# Patient Record
Sex: Female | Born: 1935
Health system: Southern US, Community
[De-identification: ages and names within clinical notes are randomized; demographics above are authoritative.]

## PROBLEM LIST (undated history)

## (undated) DIAGNOSIS — M545 Low back pain, unspecified: Secondary | ICD-10-CM

## (undated) DIAGNOSIS — M25511 Pain in right shoulder: Secondary | ICD-10-CM

## (undated) DIAGNOSIS — I872 Venous insufficiency (chronic) (peripheral): Secondary | ICD-10-CM

## (undated) DIAGNOSIS — R0609 Other forms of dyspnea: Secondary | ICD-10-CM

## (undated) DIAGNOSIS — M25512 Pain in left shoulder: Secondary | ICD-10-CM

## (undated) DIAGNOSIS — D649 Anemia, unspecified: Secondary | ICD-10-CM

## (undated) DIAGNOSIS — F32A Depression, unspecified: Secondary | ICD-10-CM

## (undated) DIAGNOSIS — I517 Cardiomegaly: Secondary | ICD-10-CM

## (undated) DIAGNOSIS — K219 Gastro-esophageal reflux disease without esophagitis: Secondary | ICD-10-CM

## (undated) DIAGNOSIS — E669 Obesity, unspecified: Secondary | ICD-10-CM

## (undated) DIAGNOSIS — G4733 Obstructive sleep apnea (adult) (pediatric): Secondary | ICD-10-CM

## (undated) DIAGNOSIS — I350 Nonrheumatic aortic (valve) stenosis: Secondary | ICD-10-CM

## (undated) DIAGNOSIS — I10 Essential (primary) hypertension: Secondary | ICD-10-CM

## (undated) DIAGNOSIS — N3281 Overactive bladder: Secondary | ICD-10-CM

## (undated) DIAGNOSIS — G4700 Insomnia, unspecified: Secondary | ICD-10-CM

## (undated) DIAGNOSIS — K5790 Diverticulosis of intestine, part unspecified, without perforation or abscess without bleeding: Secondary | ICD-10-CM

## (undated) DIAGNOSIS — I480 Paroxysmal atrial fibrillation: Secondary | ICD-10-CM

## (undated) DIAGNOSIS — F329 Major depressive disorder, single episode, unspecified: Secondary | ICD-10-CM

## (undated) DIAGNOSIS — R0600 Dyspnea, unspecified: Secondary | ICD-10-CM

## (undated) DIAGNOSIS — I5032 Chronic diastolic (congestive) heart failure: Secondary | ICD-10-CM

## (undated) DIAGNOSIS — I714 Abdominal aortic aneurysm, without rupture, unspecified: Secondary | ICD-10-CM

## (undated) DIAGNOSIS — M069 Rheumatoid arthritis, unspecified: Secondary | ICD-10-CM

## (undated) DIAGNOSIS — E039 Hypothyroidism, unspecified: Secondary | ICD-10-CM

## (undated) DIAGNOSIS — M1990 Unspecified osteoarthritis, unspecified site: Secondary | ICD-10-CM

## (undated) HISTORY — DX: Obesity, unspecified: E66.9

## (undated) HISTORY — DX: Essential (primary) hypertension: I10

## (undated) HISTORY — DX: Pain in right shoulder: M25.511

## (undated) HISTORY — PX: VESICOVAGINAL FISTULA CLOSURE W/ TAH: SUR271

## (undated) HISTORY — DX: Hypothyroidism, unspecified: E03.9

## (undated) HISTORY — DX: Overactive bladder: N32.81

## (undated) HISTORY — DX: Rheumatoid arthritis, unspecified: M06.9

## (undated) HISTORY — DX: Cardiomegaly: I51.7

## (undated) HISTORY — DX: Gastro-esophageal reflux disease without esophagitis: K21.9

## (undated) HISTORY — DX: Low back pain: M54.5

## (undated) HISTORY — DX: Pain in left shoulder: M25.512

## (undated) HISTORY — DX: Diverticulosis of intestine, part unspecified, without perforation or abscess without bleeding: K57.90

## (undated) HISTORY — DX: Major depressive disorder, single episode, unspecified: F32.9

## (undated) HISTORY — DX: Obstructive sleep apnea (adult) (pediatric): G47.33

## (undated) HISTORY — DX: Other forms of dyspnea: R06.09

## (undated) HISTORY — DX: Abdominal aortic aneurysm, without rupture, unspecified: I71.40

## (undated) HISTORY — DX: Insomnia, unspecified: G47.00

## (undated) HISTORY — DX: Chronic diastolic (congestive) heart failure: I50.32

## (undated) HISTORY — DX: Anemia, unspecified: D64.9

## (undated) HISTORY — PX: CYSTOCELE REPAIR: SHX163

## (undated) HISTORY — PX: OTHER SURGICAL HISTORY: SHX169

## (undated) HISTORY — PX: KNEE ARTHROSCOPY W/ AUTOGENOUS CARTILAGE IMPLANTATION (ACI) PROCEDURE: SHX1865

## (undated) HISTORY — DX: Depression, unspecified: F32.A

## (undated) HISTORY — DX: Venous insufficiency (chronic) (peripheral): I87.2

## (undated) HISTORY — DX: Dyspnea, unspecified: R06.00

## (undated) HISTORY — DX: Low back pain, unspecified: M54.50

---
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
## 1965-04-11 HISTORY — PX: WRIST SURGERY: SHX841

## 1970-04-11 HISTORY — PX: TOTAL ABDOMINAL HYSTERECTOMY: SHX209

## 1978-04-11 HISTORY — PX: OTHER SURGICAL HISTORY: SHX169

## 1988-04-11 HISTORY — PX: SHOULDER SURGERY: SHX246

## 1994-04-11 HISTORY — PX: OTHER SURGICAL HISTORY: SHX169

## 1994-04-11 HISTORY — PX: COSMETIC SURGERY: SHX468

## 2000-03-16 ENCOUNTER — Ambulatory Visit (HOSPITAL_COMMUNITY): Admission: RE | Admit: 2000-03-16 | Discharge: 2000-03-16 | Payer: Self-pay | Admitting: *Deleted

## 2002-11-20 ENCOUNTER — Ambulatory Visit: Admission: RE | Admit: 2002-11-20 | Discharge: 2002-11-20 | Payer: Self-pay

## 2003-09-03 ENCOUNTER — Emergency Department (HOSPITAL_COMMUNITY): Admission: EM | Admit: 2003-09-03 | Discharge: 2003-09-03 | Payer: Self-pay | Admitting: Emergency Medicine

## 2003-11-18 ENCOUNTER — Encounter: Admission: RE | Admit: 2003-11-18 | Discharge: 2003-11-18 | Payer: Self-pay | Admitting: Neurosurgery

## 2006-11-29 ENCOUNTER — Ambulatory Visit: Payer: Self-pay | Admitting: Cardiology

## 2006-12-19 ENCOUNTER — Ambulatory Visit: Payer: Self-pay

## 2006-12-19 ENCOUNTER — Ambulatory Visit: Payer: Self-pay | Admitting: Cardiology

## 2006-12-19 LAB — CONVERTED CEMR LAB
BUN: 25 mg/dL — ABNORMAL HIGH (ref 6–23)
CO2: 32 meq/L (ref 19–32)
Cholesterol: 226 mg/dL (ref 0–200)
Creatinine, Ser: 1 mg/dL (ref 0.4–1.2)
Direct LDL: 159.2 mg/dL
HDL: 43.9 mg/dL (ref >39.0)
Potassium: 4 meq/L (ref 3.5–5.1)

## 2008-08-18 ENCOUNTER — Encounter: Payer: Self-pay | Admitting: Pulmonary Disease

## 2008-08-20 ENCOUNTER — Encounter: Payer: Self-pay | Admitting: Pulmonary Disease

## 2008-08-22 ENCOUNTER — Ambulatory Visit: Payer: Self-pay | Admitting: Pulmonary Disease

## 2008-08-22 DIAGNOSIS — R0902 Hypoxemia: Secondary | ICD-10-CM | POA: Insufficient documentation

## 2008-08-22 DIAGNOSIS — R042 Hemoptysis: Secondary | ICD-10-CM | POA: Insufficient documentation

## 2008-08-22 LAB — CONVERTED CEMR LAB
CO2: 31 meq/L (ref 19–32)
Chloride: 103 meq/L (ref 96–112)
Creatinine, Ser: 0.6 mg/dL (ref 0.4–1.2)

## 2008-08-25 ENCOUNTER — Ambulatory Visit: Payer: Self-pay | Admitting: Cardiovascular Disease

## 2008-08-25 DIAGNOSIS — J8489 Other specified interstitial pulmonary diseases: Secondary | ICD-10-CM | POA: Insufficient documentation

## 2008-08-26 ENCOUNTER — Telehealth (INDEPENDENT_AMBULATORY_CARE_PROVIDER_SITE_OTHER): Payer: Self-pay | Admitting: *Deleted

## 2008-09-02 ENCOUNTER — Ambulatory Visit (HOSPITAL_COMMUNITY): Admission: RE | Admit: 2008-09-02 | Discharge: 2008-09-02 | Payer: Self-pay | Admitting: Pulmonary Disease

## 2008-09-04 ENCOUNTER — Ambulatory Visit: Payer: Self-pay | Admitting: Pulmonary Disease

## 2008-09-04 DIAGNOSIS — S2239XA Fracture of one rib, unspecified side, initial encounter for closed fracture: Secondary | ICD-10-CM | POA: Insufficient documentation

## 2008-09-29 ENCOUNTER — Ambulatory Visit: Payer: Self-pay | Admitting: Internal Medicine

## 2008-09-30 ENCOUNTER — Ambulatory Visit: Payer: Self-pay | Admitting: Pulmonary Disease

## 2008-10-21 ENCOUNTER — Ambulatory Visit (HOSPITAL_BASED_OUTPATIENT_CLINIC_OR_DEPARTMENT_OTHER): Admission: RE | Admit: 2008-10-21 | Discharge: 2008-10-21 | Payer: Self-pay | Admitting: Pulmonary Disease

## 2008-10-21 ENCOUNTER — Encounter: Payer: Self-pay | Admitting: Pulmonary Disease

## 2008-10-29 ENCOUNTER — Ambulatory Visit: Payer: Self-pay | Admitting: Pulmonary Disease

## 2008-11-13 ENCOUNTER — Ambulatory Visit: Payer: Self-pay | Admitting: Pulmonary Disease

## 2008-11-13 DIAGNOSIS — G4733 Obstructive sleep apnea (adult) (pediatric): Secondary | ICD-10-CM | POA: Insufficient documentation

## 2008-12-08 ENCOUNTER — Telehealth: Payer: Self-pay | Admitting: Pulmonary Disease

## 2009-01-14 ENCOUNTER — Inpatient Hospital Stay (HOSPITAL_COMMUNITY): Admission: RE | Admit: 2009-01-14 | Discharge: 2009-01-19 | Payer: Self-pay | Admitting: Orthopedic Surgery

## 2010-05-01 ENCOUNTER — Encounter: Payer: Self-pay | Admitting: Sports Medicine

## 2010-06-21 ENCOUNTER — Telehealth (INDEPENDENT_AMBULATORY_CARE_PROVIDER_SITE_OTHER): Payer: Self-pay | Admitting: *Deleted

## 2010-06-29 NOTE — Progress Notes (Signed)
Summary: pt had a chest xray and wants Dr Silverio Lay opinion  Phone Note Call from Patient   Caller: Patient Call For: sood Summary of Call: Patient phoned stated that she is in [facility] and she had a chest xray the impression said upper right lobe probably infections or infalmatory in orgin. Patient stated that sent her to a Thoracic surgeon and they want to operate on her lungs and she would like to speak to Dr Craige Cotta they want her to do it now and she would rather come home. She can be reached at [phone] Patient last seen by Dr Craige Cotta for sleep 11/13/2008 and 09/30/08 Initial call taken by: Vedia Coffer,  June 21, 2010 2:27 PM  Follow-up for Phone Call        Spoke with pt.  She states that she is in [place], started to have the same symptoms that brought her here in the first place- chest congestion, fatigue, and dyspnea.  She states that she went to family doc in [facility] and had cxr and ct chest and was then reffered to thoracic surgeon who told her she needs right upper lobectomy and this needs to be done asap.  She states that she wants VS's recs on this.  She states that they did not give her any abx and last time she was seen for this issue here, it was cleared up with abx. I advised we can forward msg to VS and ask if he will call her but he is out of the office until 3/19. She is okay with waiting.  I am going to place her CT report that she faxed in VS lookat.  Pls advise thanks   Follow-up by: Vernie Murders,  June 21, 2010 3:40 PM  Additional Follow-up for Phone Call Additional follow up Details #1::        Reviewed CT chest report from June 15, 2010 done in [place], [place].  This reports benign pleural findings Rt lateral thorax, and minimal ground glass infiltrate Rt upper lobe.  Explained to pt over phone that it is difficult to determine indication for Rt upper lobectomy based on this report, but that my information is limit.  She has deferred surgical intervention for now.  She will return  from [place] on April 19.  Will have triage team arrange for ROV with me after April 19 to review status, and have asked her to bring disk with her chest imaging studies from [place]. Additional Follow-up by: Coralyn Helling MD,  June 24, 2010 3:52 PM    Additional Follow-up for Phone Call Additional follow up Details #2::    called and spoke with pt.  pt scheduled to see VS 08/09/2010 at 1:45pm.  Arman Filter LPN  June 24, 2010 4:18 PM

## 2010-07-15 LAB — CBC
Hemoglobin: 10.7 g/dL — ABNORMAL LOW (ref 12.0–15.0)
MCHC: 33.8 g/dL (ref 30.0–36.0)
MCHC: 34 g/dL (ref 30.0–36.0)
MCV: 93.5 fL (ref 78.0–100.0)
Platelets: 251 10*3/uL (ref 150–400)
Platelets: 262 10*3/uL (ref 150–400)
Platelets: 273 10*3/uL (ref 150–400)
RBC: 3.21 MIL/uL — ABNORMAL LOW (ref 3.87–5.11)
RDW: 13 % (ref 11.5–15.5)
RDW: 13.1 % (ref 11.5–15.5)
WBC: 10 10*3/uL (ref 4.0–10.5)
WBC: 12.2 10*3/uL — ABNORMAL HIGH (ref 4.0–10.5)

## 2010-07-15 LAB — PROTIME-INR
INR: 1.19 (ref 0.00–1.49)
INR: 1.28 (ref 0.00–1.49)
INR: 2.03 — ABNORMAL HIGH (ref 0.00–1.49)
INR: 2.29 — ABNORMAL HIGH (ref 0.00–1.49)
Prothrombin Time: 15.9 seconds — ABNORMAL HIGH (ref 11.6–15.2)
Prothrombin Time: 22.8 seconds — ABNORMAL HIGH (ref 11.6–15.2)
Prothrombin Time: 25 seconds — ABNORMAL HIGH (ref 11.6–15.2)

## 2010-07-15 LAB — BASIC METABOLIC PANEL
BUN: 11 mg/dL (ref 6–23)
BUN: 17 mg/dL (ref 6–23)
BUN: 7 mg/dL (ref 6–23)
CO2: 29 mEq/L (ref 19–32)
CO2: 31 mEq/L (ref 19–32)
Calcium: 8.1 mg/dL — ABNORMAL LOW (ref 8.4–10.5)
Calcium: 8.4 mg/dL (ref 8.4–10.5)
Calcium: 8.4 mg/dL (ref 8.4–10.5)
Chloride: 102 mEq/L (ref 96–112)
Creatinine, Ser: 0.78 mg/dL (ref 0.4–1.2)
GFR calc Af Amer: >60 mL/min (ref >60)
GFR calc non Af Amer: 51 mL/min — ABNORMAL LOW (ref >60)
GFR calc non Af Amer: >60 mL/min (ref >60)
Glucose, Bld: 115 mg/dL — ABNORMAL HIGH (ref 70–99)
Glucose, Bld: 122 mg/dL — ABNORMAL HIGH (ref 70–99)

## 2010-07-15 LAB — TYPE AND SCREEN: Antibody Screen: NEGATIVE

## 2010-07-16 LAB — CBC
HCT: 39.6 % (ref 36.0–46.0)
Hemoglobin: 13.2 g/dL (ref 12.0–15.0)
MCV: 93.9 fL (ref 78.0–100.0)
RDW: 12.9 % (ref 11.5–15.5)

## 2010-07-16 LAB — COMPREHENSIVE METABOLIC PANEL
Alkaline Phosphatase: 86 U/L (ref 39–117)
BUN: 15 mg/dL (ref 6–23)
Creatinine, Ser: 0.72 mg/dL (ref 0.4–1.2)
Glucose, Bld: 83 mg/dL (ref 70–99)
Potassium: 4.4 mEq/L (ref 3.5–5.1)
Total Bilirubin: 0.9 mg/dL (ref 0.3–1.2)
Total Protein: 7 g/dL (ref 6.0–8.3)

## 2010-07-16 LAB — URINALYSIS, ROUTINE W REFLEX MICROSCOPIC
Bilirubin Urine: NEGATIVE
Glucose, UA: NEGATIVE mg/dL
Hgb urine dipstick: NEGATIVE
Ketones, ur: NEGATIVE mg/dL
pH: 7 (ref 5.0–8.0)

## 2010-07-16 LAB — PROTIME-INR
INR: 1 (ref 0.00–1.49)
Prothrombin Time: 13.4 seconds (ref 11.6–15.2)

## 2010-07-16 LAB — APTT: aPTT: 29 seconds (ref 24–37)

## 2010-07-19 ENCOUNTER — Encounter: Payer: Self-pay | Admitting: Pulmonary Disease

## 2010-08-06 ENCOUNTER — Encounter: Payer: Self-pay | Admitting: Pulmonary Disease

## 2010-08-09 ENCOUNTER — Ambulatory Visit (INDEPENDENT_AMBULATORY_CARE_PROVIDER_SITE_OTHER)
Admission: RE | Admit: 2010-08-09 | Discharge: 2010-08-09 | Disposition: A | Discharge status: Home / Self Care | Payer: Medicare Other | Source: Ambulatory Visit | Attending: Pulmonary Disease | Admitting: Pulmonary Disease

## 2010-08-09 ENCOUNTER — Other Ambulatory Visit: Payer: Self-pay | Admitting: Pulmonary Disease

## 2010-08-09 ENCOUNTER — Ambulatory Visit (INDEPENDENT_AMBULATORY_CARE_PROVIDER_SITE_OTHER): Payer: Medicare Other | Admitting: Pulmonary Disease

## 2010-08-09 ENCOUNTER — Other Ambulatory Visit (INDEPENDENT_AMBULATORY_CARE_PROVIDER_SITE_OTHER): Payer: Medicare Other

## 2010-08-09 ENCOUNTER — Encounter: Payer: Self-pay | Admitting: Pulmonary Disease

## 2010-08-09 VITALS — BP 124/80 | HR 69 | Temp 98.0°F | Ht 63.5 in | Wt 210.0 lb

## 2010-08-09 DIAGNOSIS — M069 Rheumatoid arthritis, unspecified: Secondary | ICD-10-CM | POA: Insufficient documentation

## 2010-08-09 DIAGNOSIS — R930 Abnormal findings on diagnostic imaging of skull and head, not elsewhere classified: Secondary | ICD-10-CM

## 2010-08-09 DIAGNOSIS — G4733 Obstructive sleep apnea (adult) (pediatric): Secondary | ICD-10-CM

## 2010-08-09 LAB — COMPREHENSIVE METABOLIC PANEL
Albumin: 4 g/dL (ref 3.5–5.2)
CO2: 29 mEq/L (ref 19–32)
Calcium: 9 mg/dL (ref 8.4–10.5)
GFR: 71.14 mL/min (ref >60.00)
Glucose, Bld: 81 mg/dL (ref 70–99)
Potassium: 4.2 mEq/L (ref 3.5–5.1)
Sodium: 137 mEq/L (ref 135–145)
Total Protein: 7.2 g/dL (ref 6.0–8.3)

## 2010-08-09 LAB — CBC WITH DIFFERENTIAL/PLATELET
Eosinophils Relative: 2.9 % (ref 0.0–5.0)
Lymphocytes Relative: 21.6 % (ref 12.0–46.0)
Monocytes Absolute: 0.7 10*3/uL (ref 0.1–1.0)
Monocytes Relative: 8.8 % (ref 3.0–12.0)
Neutrophils Relative %: 65.6 % (ref 43.0–77.0)
Platelets: 317 10*3/uL (ref 150.0–400.0)
WBC: 7.8 10*3/uL (ref 4.5–10.5)

## 2010-08-09 LAB — SEDIMENTATION RATE: Sed Rate: 29 mm/hr — ABNORMAL HIGH (ref 0–22)

## 2010-08-09 NOTE — Assessment & Plan Note (Addendum)
I have compared her imaging studies from 2010 to her current studies from [place].  I do not think the two findings are related.  Her symptom description and response to antibiotics suggest that her recent CT chest findings were likely related to an infection.  She does have a history of rheumatoid arthritis, and certainly this can cause pulmonary findings as well.  My suspicion for malignancy is lower, but she does have an extensive history of smoking.  To further assess this check her labs and repeat chest xray.  Based on these will determine when she needs to have follow up CT chest and whether she may need to have airway sampling with bronchoscopy first.  Explained that she may at some point need to have further evaluation by thoracic surgery, but there are several steps we would need to go through before that needs to be considered again.

## 2010-08-09 NOTE — Patient Instructions (Signed)
Labs and Chest xray today; will call with results Refer to Dr. Dierdre Forth with rheumatology Follow up in 3 to 4 weeks

## 2010-08-09 NOTE — Progress Notes (Signed)
Subjective:    [name], female    DOB: [birth year], 77 y.o.   MRN: [id number]  HPI 77 yo female with abnormal chest xray.  I last saw Lisa Mcclure in 11/13/08.  She was found to have hemoptysis and CT chest from 08/25/08 showed 5.3 x 2.8 cm Rt upper lobe paratracheal mass like density.  She has a history of smoking, and there was concern for malignancy.  She had PET scan from 09/02/08 which showed decrease in size of Rt upper lobe mass associated with antibiotic therapy.  She had CT chest from 09/30/08 which showed essentially complete resolution of the Rt upper lobe density.  She was in [place] for winter in 2011.  She was being seen by orthopedics for shoulder pain, and was noted to have increased blood pressure.  She then saw cardiology, and was noted to have a cough and chest congestion along with shortness of breath.  She has a cough with clear sputum.  She had a chest xray which showed right lung changes.  She was started on antibiotics, and had a CT chest.  She was referred to thoracic surgery and was advised that she has lung cancer and needs to have a Rt upper lobectomy.  She called my office at that time asking for advice.  I suggested that she return for further evaluation from [place] when she was able to.  She did not improvement in her symptoms after antibiotic therapy.    She still has a raspy cough and gets winded at times.  She is not having wheeze, fever, sweats, or chills.  She denies hemoptysis.  She still has b/l shoulder pain and stiffness.  She has some pain, swelling, and stiffness in her hands and wrists as well.  She has not had a f/u chest imaging study since she left [place].  Labs from 06/07/10 were reviewed and unremarkable.  Chest xray from 06/07/10 showed severe arthritic changes, and vague pleural based density along right chest wall.  CT chest from 06/15/10 showed ground glass infiltrate in the Rt upper lobe.   Past Medical History  Diagnosis  Date  . Rheumatoid arthritis   . Unspecified essential hypertension   . Esophageal reflux   . Hypothyroidism   . Diverticulosis   . Insomnia   . OSA (obstructive sleep apnea)      Family History  Problem Relation Age of Onset  . COPD Father   . Lung cancer Mother   . Breast cancer Maternal Aunt      History   Social History  . Marital Status: Widowed    Spouse Name: N/A    Number of Children: N/A  . Years of Education: N/A   Occupational History  . Retired    Social History Main Topics  . Smoking status: Former Smoker -- 10 years    Types: Cigarettes    Quit date: 04/11/1984  . Smokeless tobacco: Not on file   Comment: does not recall how many cigs/day she smoked. Some days did not smoke at all  . Alcohol Use: Yes     cocktail daily  . Drug Use: No  . Sexually Active: Not on file   Other Topics Concern  . Not on file   Social History Narrative  . No narrative on file     Not on File   Outpatient Prescriptions Prior to Visit  Medication Sig Dispense Refill  . Cholecalciferol (RA VITAMIN D-3) 1000 UNITS tablet Take 1,000 Units by  mouth daily.        Marland Kitchen HYDROcodone-acetaminophen (VICODIN) 5-500 MG per tablet Take 1-2 daily for pain       . levothyroxine (SYNTHROID, LEVOTHROID) 100 MCG tablet Take 100 mcg by mouth daily. Monday thru Friday        . liothyronine (CYTOMEL) 25 MCG tablet Take 1 Monday Wednesday and Friday        . omeprazole (PRILOSEC) 20 MG capsule Take 20 mg by mouth daily.        Marland Kitchen telmisartan-hydrochlorothiazide (MICARDIS HCT) 80-12.5 MG per tablet Take 1 tablet by mouth daily.        . Eszopiclone (ESZOPICLONE) 3 MG TABS Take immediately before bedtime 1-2 per week       . Biotin 1000 MCG tablet Take 1,000 mcg by mouth daily.        . calcium carbonate (OS-CAL) 600 MG TABS Take 600 mg by mouth. 2 daily       . latanoprost (XALATAN) 0.005 % ophthalmic solution Place 1 drop into both eyes at bedtime.        Marland Kitchen oxybutynin (DITROPAN) 5 MG tablet  Take 5 mg by mouth at bedtime.        . verapamil (VERELAN PM) 240 MG 24 hr capsule Take 240 mg by mouth at bedtime.           Review of Systems     Objective:   Physical Exam Filed Vitals:   08/09/10 1413  BP: 124/80  Pulse: 69  Temp: 98 F (36.7 C)  TempSrc: Oral  Height: 5' 3.5" (1.613 m)  Weight: 210 lb (95.255 kg)  SpO2: 94%      General: normal appearance and obese.  Nose: no deformity, discharge, inflammation, or lesions  Mouth: MP 3, no oral lesions  Neck: no JVD, no thyromegaly  Lungs: coarse breath sounds, no wheezing or rales  Heart: regular rhythm, normal rate, and no murmurs.  Abdomen: obese, soft, non-tender, no masses, normal bowel sounds  Extremities: no edema, cyanosis, or clubbing  Cervical Nodes: no significant adenopathy  Assessment & Plan:   ABNORMAL CHEST XRAY I have compared her imaging studies from 2010 to her current studies from [place].  I do not think the two findings are related.  Her symptom description and response to antibiotics suggest that her recent CT chest findings were likely related to an infection.  She does have a history of rheumatoid arthritis, and certainly this can cause pulmonary findings as well.  My suspicion for malignancy is lower, but she does have an extensive history of smoking.  To further assess this check her labs and repeat chest xray.  Based on these will determine when she needs to have follow up CT chest and whether she may need to have airway sampling with bronchoscopy first.  Explained that she may at some point need to have further evaluation by thoracic surgery, but there are several steps we would need to go through before that needs to be considered again.  Rheumatoid arthritis She has persistent joint symptoms.  She has not seen a rheumatologist in years.  Her daughter is a family friend of Dr. Dierdre Forth.  I have suggested that she have re-evaluation with rheumatology, and will refer to Dr.  Dierdre Forth.    Updated Medication List Outpatient Encounter Prescriptions as of 08/09/2010  Medication Sig Dispense Refill  . amLODipine (NORVASC) 5 MG tablet Take 5 mg by mouth daily.        Marland Kitchen aspirin 81  MG tablet Take 81 mg by mouth daily.        . celecoxib (CELEBREX) 200 MG capsule Take 200 mg by mouth daily.        . Cholecalciferol (RA VITAMIN D-3) 1000 UNITS tablet Take 1,000 Units by mouth daily.        . furosemide (LASIX) 40 MG tablet Take 40 mg by mouth daily as needed.        Marland Kitchen HYDROcodone-acetaminophen (VICODIN) 5-500 MG per tablet Take 1-2 daily for pain       . levothyroxine (SYNTHROID, LEVOTHROID) 100 MCG tablet Take 100 mcg by mouth daily. Monday thru Friday        . liothyronine (CYTOMEL) 25 MCG tablet Take 1 Monday Wednesday and Friday        . omeprazole (PRILOSEC) 20 MG capsule Take 20 mg by mouth daily.        . pravastatin (PRAVACHOL) 20 MG tablet Take 20 mg by mouth daily.        Marland Kitchen telmisartan-hydrochlorothiazide (MICARDIS HCT) 80-12.5 MG per tablet Take 1 tablet by mouth daily.        . Trospium Chloride (SANCTURA XR) 60 MG CP24 Take 1 capsule by mouth daily.        Marland Kitchen DISCONTD: Eszopiclone (ESZOPICLONE) 3 MG TABS Take immediately before bedtime 1-2 per week       . DISCONTD: Biotin 1000 MCG tablet Take 1,000 mcg by mouth daily.        Marland Kitchen DISCONTD: calcium carbonate (OS-CAL) 600 MG TABS Take 600 mg by mouth. 2 daily       . DISCONTD: latanoprost (XALATAN) 0.005 % ophthalmic solution Place 1 drop into both eyes at bedtime.        Marland Kitchen DISCONTD: oxybutynin (DITROPAN) 5 MG tablet Take 5 mg by mouth at bedtime.        Marland Kitchen DISCONTD: verapamil (VERELAN PM) 240 MG 24 hr capsule Take 240 mg by mouth at bedtime.          Marland Kitchen

## 2010-08-10 LAB — MPO/PR-3 (ANCA) ANTIBODIES: Serine Protease 3: 2 AU/mL (ref <20)

## 2010-08-10 LAB — ANCA SCREEN W REFLEX TITER
Atypical p-ANCA Screen: NEGATIVE
p-ANCA Screen: NEGATIVE

## 2010-08-10 LAB — RHEUMATOID FACTOR: Rhuematoid fact SerPl-aCnc: <10 IU/mL — ABNORMAL LOW (ref <=14)

## 2010-08-11 ENCOUNTER — Telehealth: Payer: Self-pay | Admitting: Pulmonary Disease

## 2010-08-11 NOTE — Telephone Encounter (Signed)
No significant abnormalities on labs or chest xray.  Results d/w pt over the phone.  Continue clinical and radiographic observation.

## 2010-08-17 NOTE — Assessment & Plan Note (Signed)
She has persistent joint symptoms.  She has not seen a rheumatologist in years.  Her daughter is a family friend of Dr. Dierdre Forth.  I have suggested that she have re-evaluation with rheumatology, and will refer to Dr. Dierdre Forth.

## 2010-08-18 ENCOUNTER — Telehealth: Payer: Self-pay | Admitting: *Deleted

## 2010-08-18 NOTE — Telephone Encounter (Signed)
Called [facility] and they state they are faxing over the results now. Will place labs in your look at Dr. Craige Cotta. Thanks  Carver Fila, CMA

## 2010-08-18 NOTE — Telephone Encounter (Signed)
Message copied by Carver Fila on Wed Aug 18, 2010  8:50 AM ------      Message from: Coralyn Helling      Created: Tue Aug 17, 2010 11:42 AM       Ms Coppens had labs from 08/09/10.  I have not received rheumatoid factor or Anti CCP antibody.  Can you check to see what the status of these labs are.  Thanks.

## 2010-08-24 NOTE — Assessment & Plan Note (Signed)
[facility]                            CARDIOLOGY OFFICE NOTE   Lisa, SPEGAL Mcclure                  MRN:          [id number]  DATE:11/29/2006                            DOB:          01-18-1933    REFERRING PHYSICIAN:  Barry Dienes. Eloise Harman, M.D.   CARDIAC CONSULTATION NOTE.   PRIMARY CARE PHYSICIAN:  Dr. Jarome Matin.   REASON FOR REFERRAL:  Evaluation of shortness of breath and fatigue.   CLINICAL HISTORY:  Lisa Mcclure is 77 years old and was referred by  Dr. Eloise Harman for symptoms of exertional dyspnea and fatigue.  I had met  her when her husband was in the hospital last December with a seizure  and congestive heart failure.  He subsequently died while they were in  [place] in March.  Lisa Mcclure has been somewhat depressed since he  died, since she was the major caregiver and they had been together for  35 years.  Over the last number of weeks she has had symptoms of  generalized fatigue and shortness of breath.  She gives out quite easily  with exertion.  She is limited by arthritic pain, so she has not been  able to be too active and has attributed some of this to deconditioning.  She says she has not had any chest pain or chest tightness with the  shortness of breath that she has with exertion.  She has had no  palpitations.   PAST MEDICAL HISTORY:  Significant for:  1. Hypertension.  2. Hypothyroidism.  3. Was recently found to have a low TSH and her thyroid medicine was      reduced.  4. She also has a history of fibromyalgia and degenerative arthritis      of her knees and shoulders.  She may need total knee replacement on      the right side.  5. She also has a history of hypertension but no known diabetes or      hyperlipidemia.   SOCIAL HISTORY:  She has 3 children by her first marriage and her  husband had 2 children by his first marriage.  These children are  scattered across the country but she has 1 daughter who  lives in  [place].  She is now by herself since her husband died.  She quit  smoking in Aug 28, 1984.   FAMILY HISTORY:  Her mother died at age 70 of cancer and her father died  at the age of 52 of lung disease.  She does have a maternal grandmother  who had an MI in her 19's.   REVIEW OF SYSTEMS:  Positive for arthritic complaints, pain in her  shoulders and knees and symptoms of fatigue.   EXAMINATION TODAY:  The blood pressure was 109/61 and the pulse 72 and  regular.  There was no venous distension.  The carotid pulses were full  and I could hear no bruits.  CHEST:  Clear without rales or rhonchi.  Cardiac rhythm was regular and  I could hear no murmurs or gallops.  ABDOMEN:  Soft with normal bowel sounds.  There was no  hepatosplenomegaly.  There were no pulsatile masses.  The peripheral  pulses were full and there was no peripheral edema.  MUSCULOSKELETAL SYSTEM:  No deformities.  SKIN:  Warm and dry.  NEUROLOGICAL EXAMINATION:  No focal neurological signs.   An electrocardiogram was normal.   IMPRESSION:  1. Symptoms of exertional dyspnea and fatigue, rule out ischemia.  2. Hypertension.  3. Treated hypothyroidism with low TSH, now with reduced dose of      Synthroid.  4. Depression.   RECOMMENDATIONS:  Mrs. Femia has a moderate risk profile, being 77  years old and having a history of hypertension and some family history  of coronary heart disease.  Her symptoms could possibly be ischemic,  although alternatively they could be related to deconditioning and  depression following the death of her husband.  I discussed with her  further evaluation and plan to get a chest x-ray and schedule for a 2D  echocardiogram and also get a BNP and a BMP and lipid profile.  I do not  believe Dr. Eloise Harman got these on his latest laboratory studies.  I  talked about getting a Myoview scan, but she was reluctant to have this  done because she had difficulty with one in the past.  I will  plan to  hold off on that now unless she has some abnormality in her other  studies.  If she needs total knee replacement, then may encourage her to  have that as an evaluation prior to that as an evaluation prior to that.  I will be in touch with her by phone after we have the results of her  tests.  She is going to [place] this Saturday to visit her son and so  we will plan these tests when she gets back from her visit.     Bruce Elvera Lennox Juanda Chance, MD, [facility]  Electronically Signed    BRB/MedQ  DD: 11/29/2006  DT: 11/30/2006  Job #: [id number]

## 2010-08-24 NOTE — Procedures (Signed)
NAME:  Lisa Mcclure, Lisa Mcclure NO.:  [id number]   MEDICAL RECORD NO.:  [id number]          PATIENT TYPE:  OUT   LOCATION:  SLEEP CENTER                 FACILITY:  [facility]   PHYSICIAN:  Coralyn Helling, MD        DATE OF BIRTH:  01/26/1933   DATE OF STUDY:  10/21/2008                            NOCTURNAL POLYSOMNOGRAM   REFERRING PHYSICIAN:  Coralyn Helling, MD   INDICATIONS:  Ms. Vossler is a 77 year old female who has a history of  hypothyroidism, hypertension, and arthritis.  She also has symptoms of  snoring and daytime sleepiness.  She underwent an overnight oximetry  which showed significant oxygen desaturation.  As a result, she is  referred to the Sleep Lab for evaluation of hypersomnia with obstructive  sleep apnea.  Epworth score was 4.   MEDICATIONS:  Micardis, Verelan, omeprazole, Synthroid, Cymbalta,  Lunesta, liothyronine, oxybutynin, Xalatan, hydrocodone, vitamin D,  biotin, and fish oil.  The patient took an oxybutynin and Xalatan on the  night of the study.  In addition, she took an Aleve at 12:00 a.m. on the  night of study due to back pain.   SLEEP ARCHITECTURE:  Total recording time was 366 minutes.  Total sleep  time was 106 minutes.  Sleep efficiency was 29%.  Sleep latency was 196  minutes.  The study was notable for the lack of slow wave sleep and REM  sleep.  The patient slept exclusively in the nonsupine position.  Of  note is that the patient had difficulty with sleep initiation and sleep  maintenance due to complaints of back pain.   RESPIRATORY DATA:  The average respiratory rate was 16.  Moderate  snoring was noted by the technician.  The overall apnea-hypopnea index  was 44.7.  The events were exclusively obstructive in nature.   OXYGEN DATA:  The baseline oxygenation was 98%.  The oxygen saturation  nadir was 83%.  The patient spent a total of 14 minutes with an oxygen  saturation less than 90%.   CARDIAC DATA:  The average heart rate was 78  and the rhythm strip showed  normal sinus rhythm with occasional PVCs.   MOVEMENT/PARASOMNIA:  The periodic limb movement index was 23.2.   IMPRESSION:  This study shows evidence for severe sleep-disordered  breathing.   The patient had a significant increase in her periodic leg movement  index.   The patient had difficulty with sleep initiation and sleep maintenance  due to symptoms of back pain.   With regards to her sleep-disordered breathing, what I would recommend  is the patient should consider additional therapeutic interventions such  as CPAP therapy, oral appliance or surgical intervention.  In addition,  she should be counseled with regards to the importance of diet,  exercise, and weight reduction.   With regards to her periodic limb movement increase, she should undergo  further evaluation for the possibility of restless leg syndrome.  She  may also need to undergo evaluation of her serum ferritin level.  Depending upon this evaluation additional therapies could be initiated  alternatively.  It would be reasonable to also await further  interventions for her sleep-disordered  breathing and then reassess her  periodic limb movement index.      Coralyn Helling, MD  Diplomat, American Board of Sleep Medicine  Electronically Signed     VS/MEDQ  D:  10/29/2008 07:33:35  T:  10/29/2008 21:22:14  Job:  [id number]

## 2010-08-25 NOTE — Telephone Encounter (Signed)
Labs have been faxed to number that was giving

## 2010-08-25 NOTE — Telephone Encounter (Signed)
Dr. Dierdre Forth is needing labs faxed over.  We referred patient to Dr. Dierdre Forth, they do not want to repeat labwork.  Please fax to:[phone] ATTN:  Lanora Manis

## 2010-08-27 NOTE — Op Note (Signed)
. [facility]  Patient:    Lisa Mcclure, Lisa Mcclure [facility]                  MRN: [id number] Proc. Date: 03/16/00 Adm. Date:  [id number] Attending:  Sabino Gasser                           Operative Report  PROCEDURE:  Colonoscopy.  INDICATIONS:  Rectal bleeding.  ANESTHESIA:  Fentanyl 70 mcg, Versed 7 mg.  DESCRIPTION OF PROCEDURE:  With the patient mildly sedated in the left lateral decubitus position, the Olympus videoscopic variable stiffness pediatric colonoscope was inserted in the rectum and passed under direct vision to the cecum.  The cecum identified by ileocecal valve and appendiceal orifice both of which were photographed.  From this point the colonoscope was slowly withdrawn taking circumferential views of the entire colonic mucosa stopping only in the rectum which appeared normal on direct view and showed internal hemorrhoids on retroflexed view.  The endoscope was straightened and withdrawn.  The patients vital signs and pulse oximeter remained stable.  The patient tolerated the procedure well with no apparent complications.  FINDINGS:  Diverticulosis in the sigmoid colon, internal hemorrhoids. Otherwise unremarkable colonoscopic examination.  PLAN:  Have patient followup with me on as needed basis and every 5 years. DD:  03/16/00 TD:  03/16/00 Job: [id number]

## 2010-09-02 ENCOUNTER — Encounter: Payer: Self-pay | Admitting: Pulmonary Disease

## 2010-10-04 ENCOUNTER — Encounter: Payer: Self-pay | Admitting: Adult Health

## 2010-10-04 ENCOUNTER — Telehealth: Payer: Self-pay | Admitting: Pulmonary Disease

## 2010-10-04 NOTE — Telephone Encounter (Signed)
Spoke with pt, c/o chest feeling heavy and tight, productive cough with yellow mucus since Friday. I advised pt VS is out of the office today and tomorrow. Pt to come in to see TP 11am. Advised to go to UC or ER if any worse.

## 2010-10-05 ENCOUNTER — Encounter: Payer: Self-pay | Admitting: Adult Health

## 2010-10-05 ENCOUNTER — Ambulatory Visit (INDEPENDENT_AMBULATORY_CARE_PROVIDER_SITE_OTHER): Payer: Medicare Other | Admitting: Adult Health

## 2010-10-05 VITALS — BP 112/82 | HR 66 | Temp 97.4°F | Ht 64.0 in | Wt 204.0 lb

## 2010-10-05 DIAGNOSIS — J209 Acute bronchitis, unspecified: Secondary | ICD-10-CM | POA: Insufficient documentation

## 2010-10-05 MED ORDER — AMOXICILLIN-POT CLAVULANATE 875-125 MG PO TABS: 1.0000 | ORAL_TABLET | Freq: Two times a day (BID) | ORAL | Status: AC

## 2010-10-05 NOTE — Patient Instructions (Signed)
Augmentin 875mg  Twice daily  For 7 days with food  Mucinex DM Twice daily  As needed  Cough/congestion  Fluids and rest  Hope you have a great time on your Cruise.  Please contact office for sooner follow up if symptoms do not improve or worsen or seek emergency care

## 2010-10-05 NOTE — Progress Notes (Signed)
Subjective:    [name], female    DOB: [birth year], 77 y.o.   MRN: [id number]  HPI 77 yo female followed by Dr. Craige Cotta for abnormal xray. She has a hx of RA   08/09/10 Follow up xray   I last saw Lisa Mcclure in 11/13/08.  She was found to have hemoptysis and CT chest from 08/25/08 showed 5.3 x 2.8 cm Rt upper lobe paratracheal mass like density.  She has a history of smoking, and there was concern for malignancy.  She had PET scan from 09/02/08 which showed decrease in size of Rt upper lobe mass associated with antibiotic therapy.  She had CT chest from 09/30/08 which showed essentially complete resolution of the Rt upper lobe density. She was in [place] for winter in 2011.  She was being seen by orthopedics for shoulder pain, and was noted to have increased blood pressure.  She then saw cardiology, and was noted to have a cough and chest congestion along with shortness of breath.  She has a cough with clear sputum.  She had a chest xray which showed right lung changes.  She was started on antibiotics, and had a CT chest.  She was referred to thoracic surgery and was advised that she has lung cancer and needs to have a Rt upper lobectomy.  She called my office at that time asking for advice.  I suggested that she return for further evaluation from [place] when she was able to.  She did not improvement in her symptoms after antibiotic therapy.   She still has a raspy cough and gets winded at times.  She is not having wheeze, fever, sweats, or chills.  She denies hemoptysis.  She still has b/l shoulder pain and stiffness.  She has some pain, swelling, and stiffness in her hands and wrists as well.  She has not had a f/u chest imaging study since she left [place]. Labs from 06/07/10 were reviewed and unremarkable.  Chest xray from 06/07/10 showed severe arthritic changes, and vague pleural based density along right chest wall.  CT chest from 06/15/10 showed ground glass infiltrate in  the Rt upper lobe.  >>cxr w/ chronic changes, set up for follow up in 3 months with xray    10/05/10 Acute OV Pt presents for a work in visit. Complains of prod cough with yellow mucus, increased SOB x3-4days. Has some nasal drainage and congestion . She is leaving for cruise on 7.12.12 to [place] and does not want to be sick for this trip. She denies hemoptysis , weight loss or fever. No otc used. Congestion is getting worse with thick mucus.   Review of Systems    Constitutional:   No  weight loss, night sweats,  Fevers, chills, fatigue, or  lassitude.  HEENT:   No headaches,  Difficulty swallowing,  Tooth/dental problems, or  Sore throat,                + sneezing, itching,  nasal congestion, post nasal drip,   CV:  No chest pain,  Orthopnea, PND, swelling in lower extremities, anasarca, dizziness, palpitations, syncope.   GI  No heartburn, indigestion, abdominal pain, nausea, vomiting, diarrhea, change in bowel habits, loss of appetite, bloody stools.    . Pulm:  No wheezing.  No chest wall deformity  Skin: no rash or lesions.  GU: no dysuria, change in color of urine, no urgency or frequency.  No flank pain, no hematuria   MS:  No  joint   swelling.  No decreased range of motion.    Psych:  No change in mood or affect. No depression or anxiety.        Objective:   Physical Exam GEN: A/Ox3; pleasant , NAD, overweight   HEENT:  [place]/AT,  EACs-clear, TMs-wnl, NOSE-clear, THROAT-clear, no lesions, no postnasal drip or exudate noted.   NECK:  Supple w/ fair ROM; no JVD; normal carotid impulses w/o bruits; no thyromegaly or nodules palpated; no lymphadenopathy.  RESP  Coarse BS w/o, wheezes/ rales/ or rhonchi.no accessory muscle use, no dullness to percussion  CARD:  RRR, no m/r/g  , no peripheral edema, pulses intact, no cyanosis or clubbing.  GI:   Soft & nt; nml bowel sounds; no organomegaly or masses detected.  Musco: Warm bil, arthritic changes to hands   Neuro: alert,  no focal deficits noted.    Skin: Warm, no lesions or rashes         Assessment & Plan:

## 2010-10-05 NOTE — Assessment & Plan Note (Addendum)
xopenex neb given in office  Plan Augmentin 875mg  Twice daily  For 7 days with food  Mucinex DM Twice daily  As needed  Cough/congestion  Fluids and rest   follow up Dr. Craige Cotta  In 4 weeks with follow up cxr  Please contact office for sooner follow up if symptoms do not improve or worsen or seek emergency care

## 2010-10-06 NOTE — Progress Notes (Signed)
Reviewed and agree with plan.

## 2010-11-10 ENCOUNTER — Ambulatory Visit (INDEPENDENT_AMBULATORY_CARE_PROVIDER_SITE_OTHER): Payer: Medicare Other | Admitting: Pulmonary Disease

## 2010-11-10 ENCOUNTER — Encounter: Payer: Self-pay | Admitting: Pulmonary Disease

## 2010-11-10 VITALS — BP 126/72 | HR 84 | Temp 97.0°F | Ht 64.0 in | Wt 204.6 lb

## 2010-11-10 DIAGNOSIS — R930 Abnormal findings on diagnostic imaging of skull and head, not elsewhere classified: Secondary | ICD-10-CM

## 2010-11-10 DIAGNOSIS — M0510 Rheumatoid lung disease with rheumatoid arthritis of unspecified site: Secondary | ICD-10-CM

## 2010-11-10 NOTE — Progress Notes (Signed)
  Subjective:    [name], female    DOB: 1932-12-01, 77 y.o.   MRN: [id number]  HPI CC: Dr. Dierdre Forth  77 yo female former smoker with abnormal chest xray likely from BOOP in setting of rheumatoid arthritis.  She was seen by my NP in June for a bronchitis and sinus infection.  She has since recovered.  She recently went on a trip with her family to [place].  She did not have any respiratory difficulties while travelling.  She denies cough, wheeze, sputum, fever, sweats, gland swelling, or chest pain.  She does get tired and sleepy if she exerts herself too much.  Her arthritis has been stable on NSAID therapy.  Review of Systems     Objective:   Physical Exam  BP 126/72  Pulse 84  Temp(Src) 97 F (36.1 C) (Oral)  Ht 5\' 4"  (1.626 m)  Wt 204 lb 9.6 oz (92.806 kg)  BMI 35.12 kg/m2  SpO2 95%  General: normal appearance and obese.  Nose: no deformity, discharge, inflammation, or lesions  Mouth: MP 3, no oral lesions  Neck: no JVD, no thyromegaly  Lungs: no wheezing or rales  Heart: regular rhythm, normal rate, and no murmurs.  Abdomen: obese, soft, non-tender, no masses, normal bowel sounds  Extremities: no edema, cyanosis, or clubbing  Cervical Nodes: no significant adenopathy     Assessment & Plan:   BOOP (bronchiolitis obliterans with organizing pneumonia) She has done well clinically.  Will plan to repeat CT chest w/o contrast in October 2012.    Updated Medication List Outpatient Encounter Prescriptions as of 11/10/2010  Medication Sig Dispense Refill  . amLODipine (NORVASC) 5 MG tablet Take 5 mg by mouth daily.        . Cholecalciferol (RA VITAMIN D-3) 1000 UNITS tablet Take 1,000 Units by mouth daily.        . furosemide (LASIX) 40 MG tablet Take 40 mg by mouth daily as needed.        Marland Kitchen HYDROcodone-acetaminophen (VICODIN) 5-500 MG per tablet Take 1-2 daily for pain as needed      . levothyroxine (SYNTHROID, LEVOTHROID) 100 MCG tablet Take 100 mcg  by mouth daily. Monday thru Saturday       . liothyronine (CYTOMEL) 25 MCG tablet Take 1 Monday Wednesday and Friday        . naproxen sodium (ANAPROX) 220 MG tablet 220 mg. 2 tablet in the am and 2 in the pm       . omeprazole (PRILOSEC) 20 MG capsule Take 20 mg by mouth daily.        . pravastatin (PRAVACHOL) 20 MG tablet Take 20 mg by mouth daily.        Marland Kitchen telmisartan-hydrochlorothiazide (MICARDIS HCT) 80-12.5 MG per tablet Take 1 tablet by mouth daily.        Marland Kitchen DISCONTD: aspirin 81 MG tablet Take 81 mg by mouth daily.        Marland Kitchen DISCONTD: celecoxib (CELEBREX) 200 MG capsule Take 200 mg by mouth daily as needed.       Marland Kitchen DISCONTD: Trospium Chloride (SANCTURA XR) 60 MG CP24 Take 1 capsule by mouth daily.

## 2010-11-10 NOTE — Patient Instructions (Signed)
Will schedule CT chest for October 2012 Follow up after CT chest in October

## 2010-11-10 NOTE — Assessment & Plan Note (Signed)
She has done well clinically.  Will plan to repeat CT chest w/o contrast in October 2012.

## 2011-01-17 ENCOUNTER — Ambulatory Visit (INDEPENDENT_AMBULATORY_CARE_PROVIDER_SITE_OTHER)
Admission: RE | Admit: 2011-01-17 | Discharge: 2011-01-17 | Disposition: A | Discharge status: Home / Self Care | Payer: Medicare Other | Source: Ambulatory Visit | Attending: Pulmonary Disease | Admitting: Pulmonary Disease

## 2011-01-17 DIAGNOSIS — M0510 Rheumatoid lung disease with rheumatoid arthritis of unspecified site: Secondary | ICD-10-CM

## 2011-01-17 DIAGNOSIS — R930 Abnormal findings on diagnostic imaging of skull and head, not elsewhere classified: Secondary | ICD-10-CM

## 2011-01-18 ENCOUNTER — Telehealth: Payer: Self-pay | Admitting: Pulmonary Disease

## 2011-01-18 DIAGNOSIS — J8489 Other specified interstitial pulmonary diseases: Secondary | ICD-10-CM

## 2011-01-18 NOTE — Telephone Encounter (Signed)
CT chest 01/17/11>>Mild residual scarring in the anterior right upper lobe.  No suspicious pulmonary nodules.  Results d/w pt over phone.  She is not having any respiratory complaints at present.  Advised that she can f/u with pulmonary as needed.  Will forward results to her PCP.

## 2011-04-21 DIAGNOSIS — L600 Ingrowing nail: Secondary | ICD-10-CM | POA: Diagnosis not present

## 2011-05-05 DIAGNOSIS — H02409 Unspecified ptosis of unspecified eyelid: Secondary | ICD-10-CM | POA: Diagnosis not present

## 2011-06-01 DIAGNOSIS — N3946 Mixed incontinence: Secondary | ICD-10-CM | POA: Diagnosis not present

## 2011-06-01 DIAGNOSIS — N952 Postmenopausal atrophic vaginitis: Secondary | ICD-10-CM | POA: Diagnosis not present

## 2011-06-01 DIAGNOSIS — N318 Other neuromuscular dysfunction of bladder: Secondary | ICD-10-CM | POA: Diagnosis not present

## 2011-06-06 DIAGNOSIS — N3946 Mixed incontinence: Secondary | ICD-10-CM | POA: Diagnosis not present

## 2011-06-22 DIAGNOSIS — N816 Rectocele: Secondary | ICD-10-CM | POA: Diagnosis not present

## 2011-06-22 DIAGNOSIS — N318 Other neuromuscular dysfunction of bladder: Secondary | ICD-10-CM | POA: Diagnosis not present

## 2011-07-07 DIAGNOSIS — L600 Ingrowing nail: Secondary | ICD-10-CM | POA: Diagnosis not present

## 2011-08-19 DIAGNOSIS — I10 Essential (primary) hypertension: Secondary | ICD-10-CM | POA: Diagnosis not present

## 2011-08-19 DIAGNOSIS — E039 Hypothyroidism, unspecified: Secondary | ICD-10-CM | POA: Diagnosis not present

## 2011-08-19 DIAGNOSIS — Z79899 Other long term (current) drug therapy: Secondary | ICD-10-CM | POA: Diagnosis not present

## 2011-08-26 DIAGNOSIS — E039 Hypothyroidism, unspecified: Secondary | ICD-10-CM | POA: Diagnosis not present

## 2011-08-26 DIAGNOSIS — E785 Hyperlipidemia, unspecified: Secondary | ICD-10-CM | POA: Diagnosis not present

## 2011-08-26 DIAGNOSIS — Z0000 Encounter for general adult medical examination without abnormal findings: Secondary | ICD-10-CM | POA: Diagnosis not present

## 2011-08-26 DIAGNOSIS — I10 Essential (primary) hypertension: Secondary | ICD-10-CM | POA: Diagnosis not present

## 2011-08-29 DIAGNOSIS — Z1212 Encounter for screening for malignant neoplasm of rectum: Secondary | ICD-10-CM | POA: Diagnosis not present

## 2011-09-17 DIAGNOSIS — M999 Biomechanical lesion, unspecified: Secondary | ICD-10-CM | POA: Diagnosis not present

## 2011-09-17 DIAGNOSIS — M5030 Other cervical disc degeneration, unspecified cervical region: Secondary | ICD-10-CM | POA: Diagnosis not present

## 2011-09-17 DIAGNOSIS — M9981 Other biomechanical lesions of cervical region: Secondary | ICD-10-CM | POA: Diagnosis not present

## 2011-09-17 DIAGNOSIS — M5124 Other intervertebral disc displacement, thoracic region: Secondary | ICD-10-CM | POA: Diagnosis not present

## 2011-09-19 DIAGNOSIS — M9981 Other biomechanical lesions of cervical region: Secondary | ICD-10-CM | POA: Diagnosis not present

## 2011-09-19 DIAGNOSIS — M999 Biomechanical lesion, unspecified: Secondary | ICD-10-CM | POA: Diagnosis not present

## 2011-09-19 DIAGNOSIS — M5124 Other intervertebral disc displacement, thoracic region: Secondary | ICD-10-CM | POA: Diagnosis not present

## 2011-09-19 DIAGNOSIS — M5030 Other cervical disc degeneration, unspecified cervical region: Secondary | ICD-10-CM | POA: Diagnosis not present

## 2011-11-21 DIAGNOSIS — G4700 Insomnia, unspecified: Secondary | ICD-10-CM | POA: Diagnosis not present

## 2011-11-21 DIAGNOSIS — R351 Nocturia: Secondary | ICD-10-CM | POA: Diagnosis not present

## 2011-11-21 DIAGNOSIS — I10 Essential (primary) hypertension: Secondary | ICD-10-CM | POA: Diagnosis not present

## 2011-11-21 DIAGNOSIS — E039 Hypothyroidism, unspecified: Secondary | ICD-10-CM | POA: Diagnosis not present

## 2011-11-30 DIAGNOSIS — M25519 Pain in unspecified shoulder: Secondary | ICD-10-CM | POA: Diagnosis not present

## 2011-12-05 ENCOUNTER — Other Ambulatory Visit: Payer: Self-pay | Admitting: Orthopedic Surgery

## 2011-12-05 DIAGNOSIS — M25519 Pain in unspecified shoulder: Secondary | ICD-10-CM

## 2011-12-08 ENCOUNTER — Ambulatory Visit
Admission: RE | Admit: 2011-12-08 | Discharge: 2011-12-08 | Disposition: A | Discharge status: Home / Self Care | Payer: Medicare Other | Source: Ambulatory Visit | Attending: Orthopedic Surgery | Admitting: Orthopedic Surgery

## 2011-12-08 DIAGNOSIS — M25519 Pain in unspecified shoulder: Secondary | ICD-10-CM

## 2011-12-08 DIAGNOSIS — M19019 Primary osteoarthritis, unspecified shoulder: Secondary | ICD-10-CM | POA: Diagnosis not present

## 2011-12-21 DIAGNOSIS — M19019 Primary osteoarthritis, unspecified shoulder: Secondary | ICD-10-CM | POA: Diagnosis not present

## 2011-12-26 DIAGNOSIS — R51 Headache: Secondary | ICD-10-CM | POA: Diagnosis not present

## 2011-12-26 DIAGNOSIS — S0990XA Unspecified injury of head, initial encounter: Secondary | ICD-10-CM | POA: Diagnosis not present

## 2011-12-26 DIAGNOSIS — S0003XA Contusion of scalp, initial encounter: Secondary | ICD-10-CM | POA: Diagnosis not present

## 2011-12-26 DIAGNOSIS — Z23 Encounter for immunization: Secondary | ICD-10-CM | POA: Diagnosis not present

## 2011-12-26 DIAGNOSIS — S0083XA Contusion of other part of head, initial encounter: Secondary | ICD-10-CM | POA: Diagnosis not present

## 2011-12-26 DIAGNOSIS — R269 Unspecified abnormalities of gait and mobility: Secondary | ICD-10-CM | POA: Diagnosis not present

## 2012-01-25 DIAGNOSIS — E039 Hypothyroidism, unspecified: Secondary | ICD-10-CM | POA: Diagnosis not present

## 2012-01-30 DIAGNOSIS — R5381 Other malaise: Secondary | ICD-10-CM | POA: Diagnosis not present

## 2012-01-30 DIAGNOSIS — E039 Hypothyroidism, unspecified: Secondary | ICD-10-CM | POA: Diagnosis not present

## 2012-01-30 DIAGNOSIS — I10 Essential (primary) hypertension: Secondary | ICD-10-CM | POA: Diagnosis not present

## 2012-02-08 DIAGNOSIS — L600 Ingrowing nail: Secondary | ICD-10-CM | POA: Diagnosis not present

## 2012-02-27 DIAGNOSIS — E039 Hypothyroidism, unspecified: Secondary | ICD-10-CM | POA: Diagnosis not present

## 2012-02-29 DIAGNOSIS — E039 Hypothyroidism, unspecified: Secondary | ICD-10-CM | POA: Diagnosis not present

## 2012-03-07 DIAGNOSIS — R269 Unspecified abnormalities of gait and mobility: Secondary | ICD-10-CM | POA: Diagnosis not present

## 2012-03-07 DIAGNOSIS — M545 Low back pain, unspecified: Secondary | ICD-10-CM | POA: Diagnosis not present

## 2012-03-07 DIAGNOSIS — E039 Hypothyroidism, unspecified: Secondary | ICD-10-CM | POA: Diagnosis not present

## 2012-03-07 DIAGNOSIS — I10 Essential (primary) hypertension: Secondary | ICD-10-CM | POA: Diagnosis not present

## 2012-03-30 DIAGNOSIS — H02409 Unspecified ptosis of unspecified eyelid: Secondary | ICD-10-CM | POA: Diagnosis not present

## 2012-03-30 DIAGNOSIS — H25019 Cortical age-related cataract, unspecified eye: Secondary | ICD-10-CM | POA: Diagnosis not present

## 2012-06-05 DIAGNOSIS — Z01818 Encounter for other preprocedural examination: Secondary | ICD-10-CM | POA: Diagnosis not present

## 2012-06-07 DIAGNOSIS — Z01810 Encounter for preprocedural cardiovascular examination: Secondary | ICD-10-CM | POA: Diagnosis not present

## 2012-06-07 DIAGNOSIS — Z01818 Encounter for other preprocedural examination: Secondary | ICD-10-CM | POA: Diagnosis not present

## 2012-06-27 DIAGNOSIS — H02409 Unspecified ptosis of unspecified eyelid: Secondary | ICD-10-CM | POA: Diagnosis not present

## 2012-06-27 DIAGNOSIS — H02839 Dermatochalasis of unspecified eye, unspecified eyelid: Secondary | ICD-10-CM | POA: Diagnosis not present

## 2012-06-27 DIAGNOSIS — I10 Essential (primary) hypertension: Secondary | ICD-10-CM | POA: Diagnosis not present

## 2012-06-27 DIAGNOSIS — G4730 Sleep apnea, unspecified: Secondary | ICD-10-CM | POA: Diagnosis not present

## 2012-08-20 DIAGNOSIS — R350 Frequency of micturition: Secondary | ICD-10-CM | POA: Diagnosis not present

## 2012-10-24 DIAGNOSIS — M542 Cervicalgia: Secondary | ICD-10-CM | POA: Diagnosis not present

## 2012-10-24 DIAGNOSIS — M19019 Primary osteoarthritis, unspecified shoulder: Secondary | ICD-10-CM | POA: Diagnosis not present

## 2012-10-24 DIAGNOSIS — M25519 Pain in unspecified shoulder: Secondary | ICD-10-CM | POA: Diagnosis not present

## 2012-12-17 DIAGNOSIS — I10 Essential (primary) hypertension: Secondary | ICD-10-CM | POA: Diagnosis not present

## 2012-12-17 DIAGNOSIS — E039 Hypothyroidism, unspecified: Secondary | ICD-10-CM | POA: Diagnosis not present

## 2012-12-24 DIAGNOSIS — Z1331 Encounter for screening for depression: Secondary | ICD-10-CM | POA: Diagnosis not present

## 2012-12-24 DIAGNOSIS — M545 Low back pain, unspecified: Secondary | ICD-10-CM | POA: Diagnosis not present

## 2012-12-24 DIAGNOSIS — Z0000 Encounter for general adult medical examination without abnormal findings: Secondary | ICD-10-CM | POA: Diagnosis not present

## 2012-12-24 DIAGNOSIS — M25569 Pain in unspecified knee: Secondary | ICD-10-CM | POA: Diagnosis not present

## 2012-12-24 DIAGNOSIS — IMO0002 Reserved for concepts with insufficient information to code with codable children: Secondary | ICD-10-CM | POA: Diagnosis not present

## 2012-12-24 DIAGNOSIS — G4700 Insomnia, unspecified: Secondary | ICD-10-CM | POA: Diagnosis not present

## 2012-12-24 DIAGNOSIS — I10 Essential (primary) hypertension: Secondary | ICD-10-CM | POA: Diagnosis not present

## 2012-12-24 DIAGNOSIS — E039 Hypothyroidism, unspecified: Secondary | ICD-10-CM | POA: Diagnosis not present

## 2012-12-24 DIAGNOSIS — Z23 Encounter for immunization: Secondary | ICD-10-CM | POA: Diagnosis not present

## 2012-12-24 DIAGNOSIS — K5909 Other constipation: Secondary | ICD-10-CM | POA: Diagnosis not present

## 2012-12-24 DIAGNOSIS — Z79899 Other long term (current) drug therapy: Secondary | ICD-10-CM | POA: Diagnosis not present

## 2012-12-25 DIAGNOSIS — M47817 Spondylosis without myelopathy or radiculopathy, lumbosacral region: Secondary | ICD-10-CM | POA: Diagnosis not present

## 2013-02-07 DIAGNOSIS — M47817 Spondylosis without myelopathy or radiculopathy, lumbosacral region: Secondary | ICD-10-CM | POA: Diagnosis not present

## 2013-02-07 DIAGNOSIS — IMO0002 Reserved for concepts with insufficient information to code with codable children: Secondary | ICD-10-CM | POA: Diagnosis not present

## 2013-02-07 DIAGNOSIS — M48061 Spinal stenosis, lumbar region without neurogenic claudication: Secondary | ICD-10-CM | POA: Diagnosis not present

## 2013-02-08 DIAGNOSIS — D239 Other benign neoplasm of skin, unspecified: Secondary | ICD-10-CM | POA: Diagnosis not present

## 2013-02-08 DIAGNOSIS — L570 Actinic keratosis: Secondary | ICD-10-CM | POA: Diagnosis not present

## 2013-02-08 DIAGNOSIS — R233 Spontaneous ecchymoses: Secondary | ICD-10-CM | POA: Diagnosis not present

## 2013-02-08 DIAGNOSIS — D235 Other benign neoplasm of skin of trunk: Secondary | ICD-10-CM | POA: Diagnosis not present

## 2013-02-08 DIAGNOSIS — L821 Other seborrheic keratosis: Secondary | ICD-10-CM | POA: Diagnosis not present

## 2013-02-12 DIAGNOSIS — M48061 Spinal stenosis, lumbar region without neurogenic claudication: Secondary | ICD-10-CM | POA: Diagnosis not present

## 2013-02-12 DIAGNOSIS — M47817 Spondylosis without myelopathy or radiculopathy, lumbosacral region: Secondary | ICD-10-CM | POA: Diagnosis not present

## 2013-02-20 DIAGNOSIS — Z1231 Encounter for screening mammogram for malignant neoplasm of breast: Secondary | ICD-10-CM | POA: Diagnosis not present

## 2013-02-22 ENCOUNTER — Encounter (HOSPITAL_BASED_OUTPATIENT_CLINIC_OR_DEPARTMENT_OTHER): Payer: Self-pay | Admitting: Emergency Medicine

## 2013-02-22 ENCOUNTER — Emergency Department (HOSPITAL_BASED_OUTPATIENT_CLINIC_OR_DEPARTMENT_OTHER): Payer: Medicare Other

## 2013-02-22 ENCOUNTER — Emergency Department (HOSPITAL_BASED_OUTPATIENT_CLINIC_OR_DEPARTMENT_OTHER)
Admission: EM | Admit: 2013-02-22 | Discharge: 2013-02-22 | Disposition: A | Discharge status: Home / Self Care | Payer: Medicare Other | Attending: Emergency Medicine | Admitting: Emergency Medicine

## 2013-02-22 DIAGNOSIS — Z79899 Other long term (current) drug therapy: Secondary | ICD-10-CM | POA: Diagnosis not present

## 2013-02-22 DIAGNOSIS — I10 Essential (primary) hypertension: Secondary | ICD-10-CM | POA: Diagnosis not present

## 2013-02-22 DIAGNOSIS — Y92009 Unspecified place in unspecified non-institutional (private) residence as the place of occurrence of the external cause: Secondary | ICD-10-CM | POA: Insufficient documentation

## 2013-02-22 DIAGNOSIS — Z87891 Personal history of nicotine dependence: Secondary | ICD-10-CM | POA: Diagnosis not present

## 2013-02-22 DIAGNOSIS — Z8669 Personal history of other diseases of the nervous system and sense organs: Secondary | ICD-10-CM | POA: Diagnosis not present

## 2013-02-22 DIAGNOSIS — S52509A Unspecified fracture of the lower end of unspecified radius, initial encounter for closed fracture: Secondary | ICD-10-CM | POA: Diagnosis not present

## 2013-02-22 DIAGNOSIS — Y939 Activity, unspecified: Secondary | ICD-10-CM | POA: Insufficient documentation

## 2013-02-22 DIAGNOSIS — S62102A Fracture of unspecified carpal bone, left wrist, initial encounter for closed fracture: Secondary | ICD-10-CM

## 2013-02-22 DIAGNOSIS — W010XXA Fall on same level from slipping, tripping and stumbling without subsequent striking against object, initial encounter: Secondary | ICD-10-CM | POA: Insufficient documentation

## 2013-02-22 DIAGNOSIS — E039 Hypothyroidism, unspecified: Secondary | ICD-10-CM | POA: Diagnosis not present

## 2013-02-22 DIAGNOSIS — M069 Rheumatoid arthritis, unspecified: Secondary | ICD-10-CM | POA: Diagnosis not present

## 2013-02-22 DIAGNOSIS — K219 Gastro-esophageal reflux disease without esophagitis: Secondary | ICD-10-CM | POA: Diagnosis not present

## 2013-02-22 NOTE — ED Notes (Signed)
Pt c/o fall from standing injuring to left wrist

## 2013-02-22 NOTE — ED Provider Notes (Signed)
CSN: [id number]     Arrival date & time 02/22/13  2153 History  This chart was scribed for Hilario Quarry, MD by Joaquin Music, ED Scribe. This patient was seen in room MH06/MH06 and the patient's care was started at 10:11 PM.   Chief Complaint  Patient presents with  . Arm Injury   Patient is a 79 y.o. female presenting with arm injury. The history is provided by the patient. No language interpreter was used.  Arm Injury Location:  Wrist Time since incident:  2 hours Injury: yes   Mechanism of injury: fall   Fall:    Fall occurred:  Tripped   Impact surface:  Hard floor   Point of impact:  Hands   Entrapped after fall: no   Wrist location:  L wrist Pain details:    Quality:  Aching, dull and pressure   Radiates to:  Does not radiate   Severity:  Mild   Onset quality:  Sudden   Timing:  Constant   Progression:  Unchanged Chronicity:  New Handedness:  Right-handed Dislocation: no   Foreign body present:  No foreign bodies Tetanus status:  Unknown Prior injury to area:  Yes Relieved by:  Nothing Worsened by:  Nothing tried Ineffective treatments:  None tried Risk factors comment:  Hx of Arthritis  HPI Comments: Lisa Mcclure is a 79 y.o. female with a hx of arthritis and frozen shoulders who presents to the Emergency Department complaining of L wrist injury that occurred 2 hours ago. Pt states she tripped on a corner of her carpet while at home and fall onto the floor. She states she is unsure how she truly fell. Pt states she previously broke her L wrist "many years ago". Pt is right handed. Pt denies LOC and hitting her head. Pt denies taking blood thinners.Pt sees Dr. Waunita Schooner and Dr. Rennis Chris at [facility]. Pt states she took Alieve PTA. Pt denies any other injuries.   Past Medical History  Diagnosis Date  . Rheumatoid arthritis(714.0)   . Unspecified essential hypertension   . Esophageal reflux   . Hypothyroidism   . Diverticulosis   .  Insomnia   . OSA (obstructive sleep apnea)    Past Surgical History  Procedure Laterality Date  . Appendectomy    . Vesicovaginal fistula closure w/ tah    . Breast biopsy    . Knee arthroscopy w/ autogenous cartilage implantation (aci) procedure    . Knee arthroscopy    . Shoulder surgery    . Cystocele repair     Family History  Problem Relation Age of Onset  . COPD Father   . Lung cancer Mother   . Breast cancer Maternal Aunt    History  Substance Use Topics  . Smoking status: Former Smoker -- 10 years    Types: Cigarettes    Quit date: 04/11/1984  . Smokeless tobacco: Not on file     Comment: does not recall how many cigs/day she smoked. Some days did not smoke at all  . Alcohol Use: Yes     Comment: cocktail daily   OB History   Grav Para Term Preterm Abortions TAB SAB Ect Mult Living                 Review of Systems  All other systems reviewed and are negative.    Allergies  Review of patient's allergies indicates no known allergies.  Home Medications   Current Outpatient Rx  Name  Route  Sig  Dispense  Refill  . amLODipine (NORVASC) 5 MG tablet   Oral   Take 5 mg by mouth daily.           . Cholecalciferol (RA VITAMIN D-3) 1000 UNITS tablet   Oral   Take 1,000 Units by mouth daily.           . furosemide (LASIX) 40 MG tablet   Oral   Take 40 mg by mouth daily as needed.           Marland Kitchen HYDROcodone-acetaminophen (VICODIN) 5-500 MG per tablet      Take 1-2 daily for pain as needed         . levothyroxine (SYNTHROID, LEVOTHROID) 100 MCG tablet   Oral   Take 100 mcg by mouth daily. Monday thru Saturday          . liothyronine (CYTOMEL) 25 MCG tablet      Take 1 Monday Wednesday and Friday           . naproxen sodium (ANAPROX) 220 MG tablet      220 mg. 2 tablet in the am and 2 in the pm          . omeprazole (PRILOSEC) 20 MG capsule   Oral   Take 20 mg by mouth daily.           . pravastatin (PRAVACHOL) 20 MG tablet    Oral   Take 20 mg by mouth daily.           Marland Kitchen telmisartan-hydrochlorothiazide (MICARDIS HCT) 80-12.5 MG per tablet   Oral   Take 1 tablet by mouth daily.            BP 153/71  Pulse 77  Temp(Src) 97.7 F (36.5 C) (Oral)  Resp 16  Ht 5\' 4"  (1.626 m)  Wt 190 lb (86.183 kg)  BMI 32.60 kg/m2  SpO2 98%  Physical Exam  Nursing note and vitals reviewed. Constitutional: She is oriented to person, place, and time. She appears well-developed and well-nourished.  HENT:  Head: Normocephalic and atraumatic.  Right Ear: External ear normal.  Left Ear: External ear normal.  Nose: Nose normal.  Mouth/Throat: Oropharynx is clear and moist.  Eyes: Conjunctivae and EOM are normal. Pupils are equal, round, and reactive to light.  Neck: Normal range of motion. Neck supple.  Cardiovascular: Normal rate, regular rhythm, normal heart sounds and intact distal pulses.   Pulmonary/Chest: Effort normal and breath sounds normal.  Abdominal: Soft. Bowel sounds are normal.  Musculoskeletal: Normal range of motion.  Neurological: She is alert and oriented to person, place, and time. She has normal reflexes.  Skin: Skin is warm and dry.  Psychiatric: She has a normal mood and affect. Her behavior is normal. Thought content normal.    ED Course  Procedures DIAGNOSTIC STUDIES: Oxygen Saturation is 98% on RA, normal by my interpretation.    COORDINATION OF CARE: 10:19 PM-Discussed treatment plan which includes L wrist splint and advised pt to take alieve PRN. F/U with Orthopedic. Pt agreed to plan.   Labs Review Labs Reviewed - No data to display Imaging Review Dg Wrist Complete Left  02/22/2013   CLINICAL DATA:  Fall, wrist pain  EXAM: LEFT WRIST - COMPLETE 3+ VIEW  COMPARISON:  None.  FINDINGS: There is a fracture of the distal radial metaphysis with minimal displacement. This predominately and impaction type fracture. Additionally there is a fracture of the ulnar styloid. Radiocarpal joint is  intact. No evidence  carpal fracture.  IMPRESSION: Fracture of the distal radius and ulna.   Electronically Signed   By: Genevive Bi M.D.   On: 02/22/2013 22:33    EKG Interpretation   None       MDM   1. Left wrist fracture, closed, initial encounter      Hilario Quarry, MD 02/24/13 2248

## 2013-02-25 DIAGNOSIS — M19019 Primary osteoarthritis, unspecified shoulder: Secondary | ICD-10-CM | POA: Diagnosis not present

## 2013-02-27 DIAGNOSIS — I10 Essential (primary) hypertension: Secondary | ICD-10-CM | POA: Diagnosis not present

## 2013-02-27 DIAGNOSIS — S5290XD Unspecified fracture of unspecified forearm, subsequent encounter for closed fracture with routine healing: Secondary | ICD-10-CM | POA: Diagnosis not present

## 2013-02-27 DIAGNOSIS — M1990 Unspecified osteoarthritis, unspecified site: Secondary | ICD-10-CM | POA: Diagnosis not present

## 2013-02-27 DIAGNOSIS — Z9181 History of falling: Secondary | ICD-10-CM | POA: Diagnosis not present

## 2013-02-27 DIAGNOSIS — R269 Unspecified abnormalities of gait and mobility: Secondary | ICD-10-CM | POA: Diagnosis not present

## 2013-02-28 DIAGNOSIS — I10 Essential (primary) hypertension: Secondary | ICD-10-CM | POA: Diagnosis not present

## 2013-02-28 DIAGNOSIS — Z9181 History of falling: Secondary | ICD-10-CM | POA: Diagnosis not present

## 2013-02-28 DIAGNOSIS — M1990 Unspecified osteoarthritis, unspecified site: Secondary | ICD-10-CM | POA: Diagnosis not present

## 2013-02-28 DIAGNOSIS — S5290XD Unspecified fracture of unspecified forearm, subsequent encounter for closed fracture with routine healing: Secondary | ICD-10-CM | POA: Diagnosis not present

## 2013-02-28 DIAGNOSIS — R269 Unspecified abnormalities of gait and mobility: Secondary | ICD-10-CM | POA: Diagnosis not present

## 2013-03-01 DIAGNOSIS — I10 Essential (primary) hypertension: Secondary | ICD-10-CM | POA: Diagnosis not present

## 2013-03-01 DIAGNOSIS — Z9181 History of falling: Secondary | ICD-10-CM | POA: Diagnosis not present

## 2013-03-01 DIAGNOSIS — M1990 Unspecified osteoarthritis, unspecified site: Secondary | ICD-10-CM | POA: Diagnosis not present

## 2013-03-01 DIAGNOSIS — R269 Unspecified abnormalities of gait and mobility: Secondary | ICD-10-CM | POA: Diagnosis not present

## 2013-03-01 DIAGNOSIS — S5290XD Unspecified fracture of unspecified forearm, subsequent encounter for closed fracture with routine healing: Secondary | ICD-10-CM | POA: Diagnosis not present

## 2013-03-04 DIAGNOSIS — R269 Unspecified abnormalities of gait and mobility: Secondary | ICD-10-CM | POA: Diagnosis not present

## 2013-03-04 DIAGNOSIS — Z9181 History of falling: Secondary | ICD-10-CM | POA: Diagnosis not present

## 2013-03-04 DIAGNOSIS — S5290XD Unspecified fracture of unspecified forearm, subsequent encounter for closed fracture with routine healing: Secondary | ICD-10-CM | POA: Diagnosis not present

## 2013-03-04 DIAGNOSIS — M1990 Unspecified osteoarthritis, unspecified site: Secondary | ICD-10-CM | POA: Diagnosis not present

## 2013-03-04 DIAGNOSIS — I10 Essential (primary) hypertension: Secondary | ICD-10-CM | POA: Diagnosis not present

## 2013-03-05 DIAGNOSIS — M19019 Primary osteoarthritis, unspecified shoulder: Secondary | ICD-10-CM | POA: Diagnosis not present

## 2013-03-05 DIAGNOSIS — S62109A Fracture of unspecified carpal bone, unspecified wrist, initial encounter for closed fracture: Secondary | ICD-10-CM | POA: Diagnosis not present

## 2013-03-06 DIAGNOSIS — M1990 Unspecified osteoarthritis, unspecified site: Secondary | ICD-10-CM | POA: Diagnosis not present

## 2013-03-06 DIAGNOSIS — Z9181 History of falling: Secondary | ICD-10-CM | POA: Diagnosis not present

## 2013-03-06 DIAGNOSIS — S5290XD Unspecified fracture of unspecified forearm, subsequent encounter for closed fracture with routine healing: Secondary | ICD-10-CM | POA: Diagnosis not present

## 2013-03-06 DIAGNOSIS — R269 Unspecified abnormalities of gait and mobility: Secondary | ICD-10-CM | POA: Diagnosis not present

## 2013-03-06 DIAGNOSIS — I10 Essential (primary) hypertension: Secondary | ICD-10-CM | POA: Diagnosis not present

## 2013-03-08 DIAGNOSIS — S5290XD Unspecified fracture of unspecified forearm, subsequent encounter for closed fracture with routine healing: Secondary | ICD-10-CM | POA: Diagnosis not present

## 2013-03-08 DIAGNOSIS — I10 Essential (primary) hypertension: Secondary | ICD-10-CM | POA: Diagnosis not present

## 2013-03-08 DIAGNOSIS — M1990 Unspecified osteoarthritis, unspecified site: Secondary | ICD-10-CM | POA: Diagnosis not present

## 2013-03-08 DIAGNOSIS — Z9181 History of falling: Secondary | ICD-10-CM | POA: Diagnosis not present

## 2013-03-08 DIAGNOSIS — R269 Unspecified abnormalities of gait and mobility: Secondary | ICD-10-CM | POA: Diagnosis not present

## 2013-03-11 DIAGNOSIS — R269 Unspecified abnormalities of gait and mobility: Secondary | ICD-10-CM | POA: Diagnosis not present

## 2013-03-11 DIAGNOSIS — Z9181 History of falling: Secondary | ICD-10-CM | POA: Diagnosis not present

## 2013-03-11 DIAGNOSIS — S62109A Fracture of unspecified carpal bone, unspecified wrist, initial encounter for closed fracture: Secondary | ICD-10-CM | POA: Diagnosis not present

## 2013-03-11 DIAGNOSIS — M1990 Unspecified osteoarthritis, unspecified site: Secondary | ICD-10-CM | POA: Diagnosis not present

## 2013-03-11 DIAGNOSIS — S5290XD Unspecified fracture of unspecified forearm, subsequent encounter for closed fracture with routine healing: Secondary | ICD-10-CM | POA: Diagnosis not present

## 2013-03-11 DIAGNOSIS — I10 Essential (primary) hypertension: Secondary | ICD-10-CM | POA: Diagnosis not present

## 2013-03-12 DIAGNOSIS — I10 Essential (primary) hypertension: Secondary | ICD-10-CM | POA: Diagnosis not present

## 2013-03-12 DIAGNOSIS — Z9181 History of falling: Secondary | ICD-10-CM | POA: Diagnosis not present

## 2013-03-12 DIAGNOSIS — S5290XD Unspecified fracture of unspecified forearm, subsequent encounter for closed fracture with routine healing: Secondary | ICD-10-CM | POA: Diagnosis not present

## 2013-03-12 DIAGNOSIS — M1990 Unspecified osteoarthritis, unspecified site: Secondary | ICD-10-CM | POA: Diagnosis not present

## 2013-03-12 DIAGNOSIS — R269 Unspecified abnormalities of gait and mobility: Secondary | ICD-10-CM | POA: Diagnosis not present

## 2013-03-13 DIAGNOSIS — S5290XD Unspecified fracture of unspecified forearm, subsequent encounter for closed fracture with routine healing: Secondary | ICD-10-CM | POA: Diagnosis not present

## 2013-03-13 DIAGNOSIS — Z9181 History of falling: Secondary | ICD-10-CM | POA: Diagnosis not present

## 2013-03-13 DIAGNOSIS — I10 Essential (primary) hypertension: Secondary | ICD-10-CM | POA: Diagnosis not present

## 2013-03-13 DIAGNOSIS — M1990 Unspecified osteoarthritis, unspecified site: Secondary | ICD-10-CM | POA: Diagnosis not present

## 2013-03-13 DIAGNOSIS — R269 Unspecified abnormalities of gait and mobility: Secondary | ICD-10-CM | POA: Diagnosis not present

## 2013-03-14 DIAGNOSIS — S5290XD Unspecified fracture of unspecified forearm, subsequent encounter for closed fracture with routine healing: Secondary | ICD-10-CM | POA: Diagnosis not present

## 2013-03-14 DIAGNOSIS — M1990 Unspecified osteoarthritis, unspecified site: Secondary | ICD-10-CM | POA: Diagnosis not present

## 2013-03-14 DIAGNOSIS — R269 Unspecified abnormalities of gait and mobility: Secondary | ICD-10-CM | POA: Diagnosis not present

## 2013-03-14 DIAGNOSIS — I10 Essential (primary) hypertension: Secondary | ICD-10-CM | POA: Diagnosis not present

## 2013-03-14 DIAGNOSIS — Z9181 History of falling: Secondary | ICD-10-CM | POA: Diagnosis not present

## 2013-03-15 DIAGNOSIS — R269 Unspecified abnormalities of gait and mobility: Secondary | ICD-10-CM | POA: Diagnosis not present

## 2013-03-15 DIAGNOSIS — I10 Essential (primary) hypertension: Secondary | ICD-10-CM | POA: Diagnosis not present

## 2013-03-15 DIAGNOSIS — M1990 Unspecified osteoarthritis, unspecified site: Secondary | ICD-10-CM | POA: Diagnosis not present

## 2013-03-15 DIAGNOSIS — Z9181 History of falling: Secondary | ICD-10-CM | POA: Diagnosis not present

## 2013-03-15 DIAGNOSIS — S5290XD Unspecified fracture of unspecified forearm, subsequent encounter for closed fracture with routine healing: Secondary | ICD-10-CM | POA: Diagnosis not present

## 2013-03-18 DIAGNOSIS — R269 Unspecified abnormalities of gait and mobility: Secondary | ICD-10-CM | POA: Diagnosis not present

## 2013-03-18 DIAGNOSIS — Z9181 History of falling: Secondary | ICD-10-CM | POA: Diagnosis not present

## 2013-03-18 DIAGNOSIS — M1990 Unspecified osteoarthritis, unspecified site: Secondary | ICD-10-CM | POA: Diagnosis not present

## 2013-03-18 DIAGNOSIS — I10 Essential (primary) hypertension: Secondary | ICD-10-CM | POA: Diagnosis not present

## 2013-03-18 DIAGNOSIS — S5290XD Unspecified fracture of unspecified forearm, subsequent encounter for closed fracture with routine healing: Secondary | ICD-10-CM | POA: Diagnosis not present

## 2013-03-20 DIAGNOSIS — S5290XD Unspecified fracture of unspecified forearm, subsequent encounter for closed fracture with routine healing: Secondary | ICD-10-CM | POA: Diagnosis not present

## 2013-03-20 DIAGNOSIS — Z9181 History of falling: Secondary | ICD-10-CM | POA: Diagnosis not present

## 2013-03-20 DIAGNOSIS — M1990 Unspecified osteoarthritis, unspecified site: Secondary | ICD-10-CM | POA: Diagnosis not present

## 2013-03-20 DIAGNOSIS — R269 Unspecified abnormalities of gait and mobility: Secondary | ICD-10-CM | POA: Diagnosis not present

## 2013-03-20 DIAGNOSIS — I10 Essential (primary) hypertension: Secondary | ICD-10-CM | POA: Diagnosis not present

## 2013-03-21 DIAGNOSIS — S62109A Fracture of unspecified carpal bone, unspecified wrist, initial encounter for closed fracture: Secondary | ICD-10-CM | POA: Diagnosis not present

## 2013-03-22 DIAGNOSIS — S5290XD Unspecified fracture of unspecified forearm, subsequent encounter for closed fracture with routine healing: Secondary | ICD-10-CM | POA: Diagnosis not present

## 2013-03-22 DIAGNOSIS — R269 Unspecified abnormalities of gait and mobility: Secondary | ICD-10-CM | POA: Diagnosis not present

## 2013-03-22 DIAGNOSIS — I10 Essential (primary) hypertension: Secondary | ICD-10-CM | POA: Diagnosis not present

## 2013-03-22 DIAGNOSIS — M1990 Unspecified osteoarthritis, unspecified site: Secondary | ICD-10-CM | POA: Diagnosis not present

## 2013-03-22 DIAGNOSIS — Z9181 History of falling: Secondary | ICD-10-CM | POA: Diagnosis not present

## 2013-03-25 DIAGNOSIS — M1990 Unspecified osteoarthritis, unspecified site: Secondary | ICD-10-CM | POA: Diagnosis not present

## 2013-03-25 DIAGNOSIS — R269 Unspecified abnormalities of gait and mobility: Secondary | ICD-10-CM | POA: Diagnosis not present

## 2013-03-25 DIAGNOSIS — I10 Essential (primary) hypertension: Secondary | ICD-10-CM | POA: Diagnosis not present

## 2013-03-25 DIAGNOSIS — S5290XD Unspecified fracture of unspecified forearm, subsequent encounter for closed fracture with routine healing: Secondary | ICD-10-CM | POA: Diagnosis not present

## 2013-03-25 DIAGNOSIS — Z9181 History of falling: Secondary | ICD-10-CM | POA: Diagnosis not present

## 2013-03-27 DIAGNOSIS — I10 Essential (primary) hypertension: Secondary | ICD-10-CM | POA: Diagnosis not present

## 2013-03-27 DIAGNOSIS — Z9181 History of falling: Secondary | ICD-10-CM | POA: Diagnosis not present

## 2013-03-27 DIAGNOSIS — S5290XD Unspecified fracture of unspecified forearm, subsequent encounter for closed fracture with routine healing: Secondary | ICD-10-CM | POA: Diagnosis not present

## 2013-03-27 DIAGNOSIS — R269 Unspecified abnormalities of gait and mobility: Secondary | ICD-10-CM | POA: Diagnosis not present

## 2013-03-27 DIAGNOSIS — M1990 Unspecified osteoarthritis, unspecified site: Secondary | ICD-10-CM | POA: Diagnosis not present

## 2013-03-29 DIAGNOSIS — Z9181 History of falling: Secondary | ICD-10-CM | POA: Diagnosis not present

## 2013-03-29 DIAGNOSIS — I10 Essential (primary) hypertension: Secondary | ICD-10-CM | POA: Diagnosis not present

## 2013-03-29 DIAGNOSIS — M1990 Unspecified osteoarthritis, unspecified site: Secondary | ICD-10-CM | POA: Diagnosis not present

## 2013-03-29 DIAGNOSIS — S5290XD Unspecified fracture of unspecified forearm, subsequent encounter for closed fracture with routine healing: Secondary | ICD-10-CM | POA: Diagnosis not present

## 2013-03-29 DIAGNOSIS — R269 Unspecified abnormalities of gait and mobility: Secondary | ICD-10-CM | POA: Diagnosis not present

## 2013-04-03 DIAGNOSIS — S52599A Other fractures of lower end of unspecified radius, initial encounter for closed fracture: Secondary | ICD-10-CM | POA: Diagnosis not present

## 2013-04-26 DIAGNOSIS — S52599A Other fractures of lower end of unspecified radius, initial encounter for closed fracture: Secondary | ICD-10-CM | POA: Diagnosis not present

## 2013-05-08 DIAGNOSIS — M25519 Pain in unspecified shoulder: Secondary | ICD-10-CM | POA: Diagnosis not present

## 2013-05-28 DIAGNOSIS — H43819 Vitreous degeneration, unspecified eye: Secondary | ICD-10-CM | POA: Diagnosis not present

## 2013-05-28 DIAGNOSIS — M25519 Pain in unspecified shoulder: Secondary | ICD-10-CM | POA: Diagnosis not present

## 2013-05-28 DIAGNOSIS — H25019 Cortical age-related cataract, unspecified eye: Secondary | ICD-10-CM | POA: Diagnosis not present

## 2013-05-28 DIAGNOSIS — M19019 Primary osteoarthritis, unspecified shoulder: Secondary | ICD-10-CM | POA: Diagnosis not present

## 2013-08-14 ENCOUNTER — Other Ambulatory Visit: Payer: Self-pay | Admitting: Internal Medicine

## 2013-08-14 DIAGNOSIS — R200 Anesthesia of skin: Secondary | ICD-10-CM

## 2013-08-14 DIAGNOSIS — R209 Unspecified disturbances of skin sensation: Secondary | ICD-10-CM | POA: Diagnosis not present

## 2013-08-14 DIAGNOSIS — Z6835 Body mass index (BMI) 35.0-35.9, adult: Secondary | ICD-10-CM | POA: Diagnosis not present

## 2013-08-14 DIAGNOSIS — I10 Essential (primary) hypertension: Secondary | ICD-10-CM | POA: Diagnosis not present

## 2013-08-14 DIAGNOSIS — G4700 Insomnia, unspecified: Secondary | ICD-10-CM | POA: Diagnosis not present

## 2013-08-21 ENCOUNTER — Ambulatory Visit
Admission: RE | Admit: 2013-08-21 | Discharge: 2013-08-21 | Disposition: A | Discharge status: Home / Self Care | Payer: Medicare Other | Source: Ambulatory Visit | Attending: Internal Medicine | Admitting: Internal Medicine

## 2013-08-21 DIAGNOSIS — R200 Anesthesia of skin: Secondary | ICD-10-CM

## 2013-08-21 DIAGNOSIS — M4802 Spinal stenosis, cervical region: Secondary | ICD-10-CM | POA: Diagnosis not present

## 2013-08-26 DIAGNOSIS — M25519 Pain in unspecified shoulder: Secondary | ICD-10-CM | POA: Diagnosis not present

## 2013-09-26 DIAGNOSIS — R209 Unspecified disturbances of skin sensation: Secondary | ICD-10-CM | POA: Diagnosis not present

## 2013-09-26 DIAGNOSIS — M19019 Primary osteoarthritis, unspecified shoulder: Secondary | ICD-10-CM | POA: Diagnosis not present

## 2013-09-26 DIAGNOSIS — M5030 Other cervical disc degeneration, unspecified cervical region: Secondary | ICD-10-CM | POA: Diagnosis not present

## 2013-09-27 DIAGNOSIS — D239 Other benign neoplasm of skin, unspecified: Secondary | ICD-10-CM | POA: Diagnosis not present

## 2013-09-27 DIAGNOSIS — L570 Actinic keratosis: Secondary | ICD-10-CM | POA: Diagnosis not present

## 2013-10-02 DIAGNOSIS — Z6835 Body mass index (BMI) 35.0-35.9, adult: Secondary | ICD-10-CM | POA: Diagnosis not present

## 2013-10-02 DIAGNOSIS — G4733 Obstructive sleep apnea (adult) (pediatric): Secondary | ICD-10-CM | POA: Diagnosis not present

## 2013-10-02 DIAGNOSIS — M545 Low back pain, unspecified: Secondary | ICD-10-CM | POA: Diagnosis not present

## 2013-10-02 DIAGNOSIS — Z79899 Other long term (current) drug therapy: Secondary | ICD-10-CM | POA: Diagnosis not present

## 2013-10-02 DIAGNOSIS — E039 Hypothyroidism, unspecified: Secondary | ICD-10-CM | POA: Diagnosis not present

## 2013-10-02 DIAGNOSIS — R0602 Shortness of breath: Secondary | ICD-10-CM | POA: Diagnosis not present

## 2013-10-02 DIAGNOSIS — I10 Essential (primary) hypertension: Secondary | ICD-10-CM | POA: Diagnosis not present

## 2013-10-08 DIAGNOSIS — G4733 Obstructive sleep apnea (adult) (pediatric): Secondary | ICD-10-CM | POA: Diagnosis not present

## 2013-10-23 DIAGNOSIS — E039 Hypothyroidism, unspecified: Secondary | ICD-10-CM | POA: Diagnosis not present

## 2013-10-23 DIAGNOSIS — Z6835 Body mass index (BMI) 35.0-35.9, adult: Secondary | ICD-10-CM | POA: Diagnosis not present

## 2013-10-23 DIAGNOSIS — K5909 Other constipation: Secondary | ICD-10-CM | POA: Diagnosis not present

## 2013-10-23 DIAGNOSIS — M25519 Pain in unspecified shoulder: Secondary | ICD-10-CM | POA: Diagnosis not present

## 2013-10-23 DIAGNOSIS — G4733 Obstructive sleep apnea (adult) (pediatric): Secondary | ICD-10-CM | POA: Diagnosis not present

## 2013-10-23 DIAGNOSIS — I10 Essential (primary) hypertension: Secondary | ICD-10-CM | POA: Diagnosis not present

## 2013-11-04 ENCOUNTER — Encounter (HOSPITAL_COMMUNITY): Payer: Self-pay | Admitting: Emergency Medicine

## 2013-11-04 ENCOUNTER — Observation Stay (HOSPITAL_COMMUNITY)
Admission: EM | Admit: 2013-11-04 | Discharge: 2013-11-04 | Disposition: A | Discharge status: Home / Self Care | Payer: Medicare Other | Attending: Internal Medicine | Admitting: Internal Medicine

## 2013-11-04 ENCOUNTER — Emergency Department (HOSPITAL_COMMUNITY): Payer: Medicare Other

## 2013-11-04 DIAGNOSIS — M069 Rheumatoid arthritis, unspecified: Secondary | ICD-10-CM | POA: Insufficient documentation

## 2013-11-04 DIAGNOSIS — IMO0002 Reserved for concepts with insufficient information to code with codable children: Secondary | ICD-10-CM | POA: Diagnosis not present

## 2013-11-04 DIAGNOSIS — M19019 Primary osteoarthritis, unspecified shoulder: Secondary | ICD-10-CM | POA: Diagnosis not present

## 2013-11-04 DIAGNOSIS — G4730 Sleep apnea, unspecified: Secondary | ICD-10-CM

## 2013-11-04 DIAGNOSIS — Z87891 Personal history of nicotine dependence: Secondary | ICD-10-CM | POA: Diagnosis not present

## 2013-11-04 DIAGNOSIS — G4733 Obstructive sleep apnea (adult) (pediatric): Secondary | ICD-10-CM | POA: Insufficient documentation

## 2013-11-04 DIAGNOSIS — M7500 Adhesive capsulitis of unspecified shoulder: Secondary | ICD-10-CM | POA: Insufficient documentation

## 2013-11-04 DIAGNOSIS — S42301A Unspecified fracture of shaft of humerus, right arm, initial encounter for closed fracture: Secondary | ICD-10-CM

## 2013-11-04 DIAGNOSIS — K219 Gastro-esophageal reflux disease without esophagitis: Secondary | ICD-10-CM | POA: Insufficient documentation

## 2013-11-04 DIAGNOSIS — S6990XA Unspecified injury of unspecified wrist, hand and finger(s), initial encounter: Secondary | ICD-10-CM | POA: Diagnosis not present

## 2013-11-04 DIAGNOSIS — W19XXXA Unspecified fall, initial encounter: Secondary | ICD-10-CM

## 2013-11-04 DIAGNOSIS — S2249XA Multiple fractures of ribs, unspecified side, initial encounter for closed fracture: Secondary | ICD-10-CM | POA: Diagnosis not present

## 2013-11-04 DIAGNOSIS — Y92009 Unspecified place in unspecified non-institutional (private) residence as the place of occurrence of the external cause: Secondary | ICD-10-CM | POA: Diagnosis not present

## 2013-11-04 DIAGNOSIS — R209 Unspecified disturbances of skin sensation: Secondary | ICD-10-CM | POA: Diagnosis not present

## 2013-11-04 DIAGNOSIS — E039 Hypothyroidism, unspecified: Secondary | ICD-10-CM | POA: Diagnosis not present

## 2013-11-04 DIAGNOSIS — Z9089 Acquired absence of other organs: Secondary | ICD-10-CM | POA: Diagnosis not present

## 2013-11-04 DIAGNOSIS — S2231XA Fracture of one rib, right side, initial encounter for closed fracture: Secondary | ICD-10-CM

## 2013-11-04 DIAGNOSIS — R52 Pain, unspecified: Secondary | ICD-10-CM

## 2013-11-04 DIAGNOSIS — M79609 Pain in unspecified limb: Secondary | ICD-10-CM | POA: Diagnosis not present

## 2013-11-04 DIAGNOSIS — W010XXA Fall on same level from slipping, tripping and stumbling without subsequent striking against object, initial encounter: Secondary | ICD-10-CM | POA: Diagnosis not present

## 2013-11-04 DIAGNOSIS — M25511 Pain in right shoulder: Secondary | ICD-10-CM | POA: Insufficient documentation

## 2013-11-04 DIAGNOSIS — M25529 Pain in unspecified elbow: Secondary | ICD-10-CM | POA: Diagnosis not present

## 2013-11-04 DIAGNOSIS — I10 Essential (primary) hypertension: Secondary | ICD-10-CM | POA: Insufficient documentation

## 2013-11-04 DIAGNOSIS — S2239XA Fracture of one rib, unspecified side, initial encounter for closed fracture: Secondary | ICD-10-CM

## 2013-11-04 DIAGNOSIS — S42302A Unspecified fracture of shaft of humerus, left arm, initial encounter for closed fracture: Secondary | ICD-10-CM

## 2013-11-04 DIAGNOSIS — T07XXXA Unspecified multiple injuries, initial encounter: Secondary | ICD-10-CM

## 2013-11-04 DIAGNOSIS — Z79899 Other long term (current) drug therapy: Secondary | ICD-10-CM | POA: Diagnosis not present

## 2013-11-04 DIAGNOSIS — S59909A Unspecified injury of unspecified elbow, initial encounter: Secondary | ICD-10-CM | POA: Diagnosis not present

## 2013-11-04 DIAGNOSIS — S42213A Unspecified displaced fracture of surgical neck of unspecified humerus, initial encounter for closed fracture: Principal | ICD-10-CM | POA: Insufficient documentation

## 2013-11-04 DIAGNOSIS — M25519 Pain in unspecified shoulder: Secondary | ICD-10-CM | POA: Diagnosis not present

## 2013-11-04 DIAGNOSIS — S42209A Unspecified fracture of upper end of unspecified humerus, initial encounter for closed fracture: Secondary | ICD-10-CM | POA: Diagnosis not present

## 2013-11-04 MED ORDER — HYDROCODONE-ACETAMINOPHEN 5-325 MG PO TABS: ORAL_TABLET | ORAL | Status: DC

## 2013-11-04 MED ORDER — DIAZEPAM 5 MG PO TABS
5.0000 mg | ORAL_TABLET | Freq: Once | ORAL | Status: AC
  Administered 2013-11-04: 5 mg via ORAL
  Filled 2013-11-04: qty 1

## 2013-11-04 MED ORDER — BACITRACIN ZINC 500 UNIT/GM EX OINT
TOPICAL_OINTMENT | Freq: Two times a day (BID) | CUTANEOUS | Status: DC
  Administered 2013-11-04: 1 via TOPICAL
  Filled 2013-11-04: qty 0.9

## 2013-11-04 MED ORDER — MORPHINE SULFATE 4 MG/ML IJ SOLN
4.0000 mg | Freq: Once | INTRAMUSCULAR | Status: AC
  Administered 2013-11-04: 4 mg via INTRAVENOUS
  Filled 2013-11-04: qty 1

## 2013-11-04 MED ORDER — OXYCODONE-ACETAMINOPHEN 5-325 MG PO TABS
1.0000 | ORAL_TABLET | Freq: Once | ORAL | Status: AC
  Administered 2013-11-04: 1 via ORAL
  Filled 2013-11-04: qty 1

## 2013-11-04 MED ORDER — TRAMADOL HCL 50 MG PO TABS: 50.0000 mg | ORAL_TABLET | Freq: Four times a day (QID) | ORAL | Status: DC | PRN

## 2013-11-04 MED ORDER — OXYCODONE-ACETAMINOPHEN 5-325 MG PO TABS: ORAL_TABLET | ORAL | Status: DC

## 2013-11-04 NOTE — ED Notes (Addendum)
Pt does not want to be discharged home at this time. Pt says that she does not have anybody to take care of her at home. PA Pisciotta aware and she is at bedside speaking with pt at this time.

## 2013-11-04 NOTE — ED Notes (Signed)
Bed: SX11 Expected date:  Expected time:  Means of arrival:  Comments: ems fall

## 2013-11-04 NOTE — Discharge Instructions (Signed)
Take percocet for breakthrough pain, do not drink alcohol, drive, care for children or do other critical tasks while taking percocet.   It is very important that you take deep breaths to prevent lung collapse and infection.  Take 10 deep breaths every hour to prevent lung collapse.  If you develop cough, fever or shortness of breath return immediately to the emergency room.  Please be very careful not to fall! The pain medication . Please rest as much as possible and try to not stay alone.   Please follow with your primary care doctor in the next 2 days for a check-up. They must obtain records for further management.   Do not hesitate to return to the Emergency Department for any new, worsening or concerning symptoms.     Humerus Fracture, Treated with Immobilization The humerus is the large bone in your upper arm. You have a broken (fractured) humerus. These fractures are easily diagnosed with X-rays. TREATMENT  Simple fractures which will heal without disability are treated with simple immobilization. Immobilization means you will wear a cast, splint, or sling. You have a fracture which will do well with immobilization. The fracture will heal well simply by being held in a good position until it is stable enough to begin range of motion exercises. Do not take part in activities which would further injure your arm.  HOME CARE INSTRUCTIONS   Put ice on the injured area.  Put ice in a plastic bag.  Place a towel between your skin and the bag.  Leave the ice on for 15-20 minutes, 03-04 times a day.  If you have a cast:  Do not scratch the skin under the cast using sharp or pointed objects.  Check the skin around the cast every day. You may put lotion on any red or sore areas.  Keep your cast dry and clean.  If you have a splint:  Wear the splint as directed.  Keep your splint dry and clean.  You may loosen the elastic around the splint if your fingers become numb, tingle,  or turn cold or blue.  If you have a sling:  Wear the sling as directed.  Do not put pressure on any part of your cast or splint until it is fully hardened.  Your cast or splint can be protected during bathing with a plastic bag. Do not lower the cast or splint into water.  Only take over-the-counter or prescription medicines for pain, discomfort, or fever as directed by your caregiver.  Do range of motion exercises as instructed by your caregiver.  Follow up as directed by your caregiver. This is very important in order to avoid permanent injury or disability and chronic pain. SEEK IMMEDIATE MEDICAL CARE IF:   Your skin or nails in the injured arm turn blue or gray.  Your arm feels cold or numb.  You develop severe pain in the injured arm.  You are having problems with the medicines you were given. MAKE SURE YOU:   Understand these instructions.  Will watch your condition.  Will get help right away if you are not doing well or get worse. Document Released: 07/04/2000 Document Revised: 06/20/2011 Document Reviewed: 05/12/2010 [facility] Patient Information 2015 [place], [place]. This information is not intended to replace advice given to you by your health care provider. Make sure you discuss any questions you have with your health care provider.    Rib Fracture A rib fracture is a break or crack in one of the bones  of the ribs. The ribs are a group of long, curved bones that wrap around your chest and attach to your spine. They protect your lungs and other organs in the chest cavity. A broken or cracked rib is often painful, but most do not cause other problems. Most rib fractures heal on their own over time. However, rib fractures can be more serious if multiple ribs are broken or if broken ribs move out of place and push against other structures. CAUSES   A direct blow to the chest. For example, this could happen during contact sports, a car accident, or a fall against a hard  object.  Repetitive movements with high force, such as pitching a baseball or having severe coughing spells. SYMPTOMS   Pain when you breathe in or cough.  Pain when someone presses on the injured area. DIAGNOSIS  Your caregiver will perform a physical exam. Various imaging tests may be ordered to confirm the diagnosis and to look for related injuries. These tests may include a chest X-ray, computed tomography (CT), magnetic resonance imaging (MRI), or a bone scan. TREATMENT  Rib fractures usually heal on their own in 1-3 months. The longer healing period is often associated with a continued cough or other aggravating activities. During the healing period, pain control is very important. Medication is usually given to control pain. Hospitalization or surgery may be needed for more severe injuries, such as those in which multiple ribs are broken or the ribs have moved out of place.  HOME CARE INSTRUCTIONS   Avoid strenuous activity and any activities or movements that cause pain. Be careful during activities and avoid bumping the injured rib.  Gradually increase activity as directed by your caregiver.  Only take over-the-counter or prescription medications as directed by your caregiver. Do not take other medications without asking your caregiver first.  Apply ice to the injured area for the first 1-2 days after you have been treated or as directed by your caregiver. Applying ice helps to reduce inflammation and pain.  Put ice in a plastic bag.  Place a towel between your skin and the bag.   Leave the ice on for 15-20 minutes at a time, every 2 hours while you are awake.  Perform deep breathing as directed by your caregiver. This will help prevent pneumonia, which is a common complication of a broken rib. Your caregiver may instruct you to:  Take deep breaths several times a day.  Try to cough several times a day, holding a pillow against the injured area.  Use a device called an  incentive spirometer to practice deep breathing several times a day.  Drink enough fluids to keep your urine clear or pale yellow. This will help you avoid constipation.   Do not wear a rib belt or binder. These restrict breathing, which can lead to pneumonia.  SEEK IMMEDIATE MEDICAL CARE IF:   You have a fever.   You have difficulty breathing or shortness of breath.   You develop a continual cough, or you cough up thick or bloody sputum.  You feel sick to your stomach (nausea), throw up (vomit), or have abdominal pain.   You have worsening pain not controlled with medications.  MAKE SURE YOU:  Understand these instructions.  Will watch your condition.  Will get help right away if you are not doing well or get worse. Document Released: 03/28/2005 Document Revised: 11/28/2012 Document Reviewed: 05/30/2012 [facility] Patient Information 2015 [place], [place]. This information is not intended to replace  advice given to you by your health care provider. Make sure you discuss any questions you have with your health care provider.

## 2013-11-04 NOTE — Progress Notes (Signed)
  CARE MANAGEMENT ED NOTE 11/04/2013  Patient:  Lisa Mcclure, Lisa Mcclure   Account Number:  [id number]  Date Initiated:  11/04/2013  Documentation initiated by:  Livia Snellen  Subjective/Objective Assessment:   Patient presents to ED post fall with injury to right arm     Subjective/Objective Assessment Detail:   Xray of right shoulder shows Incomplete, nondisplaced fracture of the proximal humerus diaphysis     Action/Plan:   Action/Plan Detail:   Anticipated DC Date:  11/04/2013     Status Recommendation to Physician:   Result of Recommendation:    Other ED Services  Consult Working [facility]  CM consult  Other   [facility] Choice  HOME HEALTH   Choice offered to / List presented to:  [name]     [place] arranged  HH-1 RN  [place].    Status of service:  Completed, signed off  ED Comments:   ED Comments Detail:  EDCM spoke to patient at bedside.  Patient to be discharged home. Patient lives alone but is currently staying at her daughters house with her grandson while her daughter is away.  [name] coming home tomorrow night. [name] is driving from [place] to stay with the patient for a few days.  Patient will have a family friend and her neighbor staying with her tonight.  Patient reports she has a walker and a cane at home.  [facility] encouraged patient to use her cane.  Patient reports her house is handicapped accessible.  Patient has walk in shower with saftey rails.  Patient reports she does not require any further equipment at home. Patient has had home health in the past with [facility] but now wants Advanced home care. [facility] provided patient with contact information for advanced home care.  EDCM informed patient that [facility] has 24-48 hours to contact her, if they do not, she may call them.  EDCM discussed patient with EDP and EDPA.  Home health orders placed for RN, PT, OT, and aide.   Patient agreeable to be discharged home.  Patient has strong support at home.  [facility] faxed home health orders to [facility] at 2250pm  with confirmation of receipt at 2257pm.  No further EDCM needs at this time.

## 2013-11-04 NOTE — Progress Notes (Addendum)
CSW met with the patient and friend of family at bedside to complete this assessment.  Patient is alert, oriented x4, calm, and cooperative.  Patient is reporting at this time she is staying at her daughter's home with grandson while the daughter is out of the country.  Patient reports that she lives alone and planned to return home by Thursday of this week when her daughter returns.  Patient is currently expressing concerns with returning home alone because of the limitation with her arm and both shoulders.  Patient reports participating with [facility] in November 2014 but not serviced by any agency at this time.    CSW requested RNCM to assist patient with home health services.  Chesley Noon, MSW, [place], 11/04/2013 Evening Clinical Social Worker [phone]

## 2013-11-04 NOTE — ED Notes (Signed)
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 250 mcg of fentanyl given by EMS.

## 2013-11-04 NOTE — ED Notes (Signed)
3 small abrasions to L forearm, irrigated w/ ns, cleaned, bacitracin applied and band aid placed over

## 2013-11-04 NOTE — ED Provider Notes (Signed)
CSN: [id number]     Arrival date & time 11/04/13  1814 History   First MD Initiated Contact with Patient 11/04/13 1821     Chief Complaint  Patient presents with  . Fall  . Shoulder Pain     (Consider location/radiation/quality/duration/timing/severity/associated sxs/prior Treatment) HPI  Lisa Mcclure is a 80 y.o. female complaining of right scapular and elbow pain status post slip mechanical slip and fall after tripping on a throw rug at her daughter's house prior to arrival. Patient denies head trauma, LOC, cervicalgia, chest pain shortness of breath, abdominal pain hip pain knee or ankle pain. Patient states that she has chronic adhesive capsulitis in the bilateral shoulders. She states that there is essentially no mobility in that she has chronic pain from this. It is hard for her to differentiate what pain is new versus chronic for her. States that she cannot move her right elbow. Patient is right-hand dominant. Denies numbness, paresthesia.  Past Medical History  Diagnosis Date  . Rheumatoid arthritis(714.0)   . Unspecified essential hypertension   . Esophageal reflux   . Hypothyroidism   . Diverticulosis   . Insomnia   . OSA (obstructive sleep apnea)    Past Surgical History  Procedure Laterality Date  . Appendectomy    . Vesicovaginal fistula closure w/ tah    . Breast biopsy    . Knee arthroscopy w/ autogenous cartilage implantation (aci) procedure    . Knee arthroscopy    . Shoulder surgery    . Cystocele repair     Family History  Problem Relation Age of Onset  . COPD Father   . Lung cancer Mother   . Breast cancer Maternal Aunt    History  Substance Use Topics  . Smoking status: Former Smoker -- 10 years    Types: Cigarettes    Quit date: 04/11/1984  . Smokeless tobacco: Not on file     Comment: does not recall how many cigs/day she smoked. Some days did not smoke at all  . Alcohol Use: Yes     Comment: cocktail daily   OB History   Grav Para  Term Preterm Abortions TAB SAB Ect Mult Living                 Review of Systems  10 systems reviewed and found to be negative, except as noted in the HPI.   Allergies  Review of patient's allergies indicates no known allergies.  Home Medications   Prior to Admission medications   Medication Sig Start Date End Date Taking? Authorizing Provider  ALPRAZolam Duanne Moron) 0.25 MG tablet Take 0.25 mg by mouth at bedtime as needed for anxiety (anxiety).   Yes Historical Provider, MD  amLODipine (NORVASC) 5 MG tablet Take 5 mg by mouth daily.     Yes Historical Provider, MD  furosemide (LASIX) 40 MG tablet Take 40 mg by mouth daily as needed.     Yes Historical Provider, MD  HYDROcodone-acetaminophen (VICODIN) 5-500 MG per tablet Take 1-2 daily for pain as needed   Yes Historical Provider, MD  levothyroxine (SYNTHROID, LEVOTHROID) 75 MCG tablet Take 75 mcg by mouth daily before breakfast.   Yes Historical Provider, MD  losartan (COZAAR) 100 MG tablet Take 100 mg by mouth daily.   Yes Historical Provider, MD  methocarbamol (ROBAXIN) 500 MG tablet Take 500 mg by mouth daily.   Yes Historical Provider, MD  omeprazole (PRILOSEC) 20 MG capsule Take 20 mg by mouth daily.  Yes Historical Provider, MD  HYDROcodone-acetaminophen (NORCO/VICODIN) 5-325 MG per tablet Take 1-2 tablets by mouth every 6 hours as needed for pain. 11/04/13   Kendyn Zaman, PA-C  oxyCODONE-acetaminophen (PERCOCET/ROXICET) 5-325 MG per tablet 1 to 2 tabs PO q6hrs  PRN for pain 11/04/13   Elmyra Ricks Maiana Hennigan, PA-C   BP 130/75  Pulse 78  Temp(Src) 97.7 F (36.5 C) (Oral)  Resp 18  SpO2 94% Physical Exam  Nursing note and vitals reviewed. Constitutional: She is oriented to person, place, and time. She appears well-developed and well-nourished. No distress.  HENT:  Head: Normocephalic and atraumatic.  Mouth/Throat: Uvula is midline and oropharynx is clear and moist. No trismus in the jaw. No uvula swelling. No oropharyngeal  exudate, posterior oropharyngeal edema, posterior oropharyngeal erythema or tonsillar abscesses.     Eyes: Conjunctivae and EOM are normal. Pupils are equal, round, and reactive to light.  Neck: Normal range of motion. Neck supple.  No midline C-spine  tenderness to palpation or step-offs appreciated. Patient has full range of motion without pain.   Cardiovascular: Normal rate, regular rhythm and intact distal pulses.   Pulmonary/Chest: Effort normal and breath sounds normal. No stridor. No respiratory distress. She has no wheezes. She has no rales. She exhibits no tenderness.  No TTP or crepitance  Abdominal: Soft. Bowel sounds are normal. She exhibits no distension and no mass. There is no tenderness. There is no rebound and no guarding.  No Seatbelt Sign  Musculoskeletal: Normal range of motion. She exhibits no edema and no tenderness.  Pelvis stable. No deformity or TTP of major joints.   Good ROM  Right radial pulse 2+ patient has excellent range of motion to wrist and finge her sensation is intact. Patient has no tenderness to palpation over the snuff box. She is diffusely tender to palpation along the elbow, elbow range of motion is significantly limited. Patient is diffusely tender to palpation along the rotator cuff of the right shoulder. States this is chronic. Patient is scapular tenderness to palpation as well. rs.   Lymphadenopathy:    She has no cervical adenopathy.  Neurological: She is alert and oriented to person, place, and time.  Strength 5/5 x4 extremities   Distal sensation intact  Skin: Skin is warm.  Partial thickness abrasions to left forearm.  Psychiatric: She has a normal mood and affect.    ED Course  Procedures (including critical care time) Labs Review Labs Reviewed - No data to display  Imaging Review Dg Scapula Right  11/04/2013   CLINICAL DATA:  Right shoulder and scapula pain.  EXAM: RIGHT SCAPULA - 2+ VIEWS  COMPARISON:  None.  FINDINGS: There is  severe osteoarthritis of the glenohumeral joint with deformity of the humeral head. I suspect there is a nondisplaced fracture of the humeral neck. There are fractures of the posterior lateral aspects of the right fifth and sixth ribs which I suspect are acute. The scapula this osteopenic but there is no visible fracture of the scapula. Clavicle appears intact.  IMPRESSION: 1. No visible scapular fracture. 2. Possible proximal right humeral neck fracture, nondisplaced. 3. Fractures of the right fifth and sixth ribs.   Electronically Signed   By: Rozetta Nunnery M.D.   On: 11/04/2013 20:11   Dg Shoulder Right  11/04/2013   CLINICAL DATA:  Fall today. Shoulder pain. Technologist unable to obtain an axillary view due to patient's severe pain apparent  EXAM: RIGHT SHOULDER - 2+ VIEW  COMPARISON:  Right elbow  and  right scapula radiographs 11/04/2013  FINDINGS: The bones are osteopenic. There is severe osteoarthritis of the right shoulder, with loss of the joint space and a large osteophyte extending inferiorly from the right humeral head. The right humeral head appears high-riding.  No evidence of dislocation. There is a linear lucency in the posterior cortex of the proximal humeral diaphysis seen on the transscapular Y-view and a subtle buckle in the medial cortex of the proximal humerus diaphysis in the frontal projection. Findings are consistent with incomplete, nondisplaced fracture.  IMPRESSION: Incomplete, nondisplaced fracture of the proximal humerus diaphysis.  Severe osteoarthritis of the right shoulder.   Electronically Signed   By: Curlene Dolphin M.D.   On: 11/04/2013 20:18   Dg Elbow Complete Right  11/04/2013   CLINICAL DATA:  Right elbow pain secondary to a fall today.  EXAM: RIGHT ELBOW - COMPLETE 3+ VIEW  COMPARISON:  None.  FINDINGS: There is no evidence of fracture, dislocation, or joint effusion. There is no evidence of arthropathy or other focal bone abnormality. Soft tissues are unremarkable.   IMPRESSION: Normal exam.   Electronically Signed   By: Rozetta Nunnery M.D.   On: 11/04/2013 20:07     EKG Interpretation None      MDM   Final diagnoses:  Humerus fracture, right, closed, initial encounter  Rib fracture, right, closed, initial encounter  Abrasions of multiple sites  Fall at home, initial encounter   Filed Vitals:   11/04/13 1818 11/04/13 2110 11/04/13 2216  BP: 146/78 141/72 130/75  Pulse: 65 71 78  Temp: 97.6 F (36.4 C)  97.7 F (36.5 C)  TempSrc: Oral  Oral  Resp: 16 18 18   SpO2: 94% 93% 94%    Medications  bacitracin ointment (1 application Topical Given 11/04/13 1854)  traMADol (ULTRAM) tablet 50 mg (not administered)  morphine 4 MG/ML injection 4 mg (4 mg Intravenous Given 11/04/13 1854)  diazepam (VALIUM) tablet 5 mg (5 mg Oral Given 11/04/13 1853)  oxyCODONE-acetaminophen (PERCOCET/ROXICET) 5-325 MG per tablet 1 tablet (1 tablet Oral Given 11/04/13 2218)    Lisa Mcclure is a 80 y.o. female presenting with right arm pain status post mechanical slip and fall at home with no head trauma.  X-rays show a proximal humerus fracture, it is nondisplaced. There is also fractures to the right fifth and sixth ribs. Patient is placed in sling and advised to follow with orthopedist.  On discharge patient is insistent that she cannot go home and care for herself. Her daughter is out of the country, she is watching her daughter's children. Patient has bilateral chronic frozen shoulder and she feels that she will not be able to care for self with his new injury. I've advised her that there is no medical indication for admission. I've spoken with Dr. Forde Dandy who agrees to admit the patient. Case management has spoken to the patient and explained to her that as there is no medical indication for admission she will have to pay the majority of the bill. After considering this patient has decided that she will go home. I have encouraged her to return to the ED with any new,  worsening or concerning symptoms.  Evaluation does not show pathology that would require ongoing emergent intervention or inpatient treatment. Pt is hemodynamically stable and mentating appropriately. Discussed findings and plan with patient/guardian, who agrees with care plan. All questions answered. Return precautions discussed and outpatient follow up given.   Discharge Medication List as of 11/04/2013  9:24 PM  START taking these medications   Details  HYDROcodone-acetaminophen (NORCO/VICODIN) 5-325 MG per tablet Take 1-2 tablets by mouth every 6 hours as needed for pain., [employer], PA-C 11/05/13 [phone]

## 2013-11-04 NOTE — ED Notes (Signed)
Ortho called for sling arm foam.

## 2013-11-05 DIAGNOSIS — S42309D Unspecified fracture of shaft of humerus, unspecified arm, subsequent encounter for fracture with routine healing: Secondary | ICD-10-CM | POA: Diagnosis not present

## 2013-11-05 NOTE — ED Provider Notes (Signed)
Medical screening examination/treatment/procedure(s) were conducted as a shared visit with non-physician practitioner(s) and myself.  I personally evaluated the patient during the encounter.   EKG Interpretation None      I interviewed and examined the patient. Lungs are CTAB. Cardiac exam wnl. Abdomen soft.  Normal sensation in RUE, normal rom of right hand/wrist/digits. 2+ pulses in RUE. Will place in sling. Care mgmt saw pt. Plan for f/u w/ ortho and d/c home. Pt happy w/ this plan. Has help at home.   Blanchard Kelch, MD 11/05/13 1018

## 2013-11-06 DIAGNOSIS — S42309D Unspecified fracture of shaft of humerus, unspecified arm, subsequent encounter for fracture with routine healing: Secondary | ICD-10-CM | POA: Diagnosis not present

## 2013-11-06 DIAGNOSIS — S51809A Unspecified open wound of unspecified forearm, initial encounter: Secondary | ICD-10-CM | POA: Diagnosis not present

## 2013-11-06 DIAGNOSIS — Z4800 Encounter for change or removal of nonsurgical wound dressing: Secondary | ICD-10-CM | POA: Diagnosis not present

## 2013-11-06 DIAGNOSIS — Z9181 History of falling: Secondary | ICD-10-CM | POA: Diagnosis not present

## 2013-11-07 DIAGNOSIS — Z4800 Encounter for change or removal of nonsurgical wound dressing: Secondary | ICD-10-CM | POA: Diagnosis not present

## 2013-11-07 DIAGNOSIS — S51809A Unspecified open wound of unspecified forearm, initial encounter: Secondary | ICD-10-CM | POA: Diagnosis not present

## 2013-11-07 DIAGNOSIS — S42309D Unspecified fracture of shaft of humerus, unspecified arm, subsequent encounter for fracture with routine healing: Secondary | ICD-10-CM | POA: Diagnosis not present

## 2013-11-07 DIAGNOSIS — Z9181 History of falling: Secondary | ICD-10-CM | POA: Diagnosis not present

## 2013-11-08 DIAGNOSIS — Z4800 Encounter for change or removal of nonsurgical wound dressing: Secondary | ICD-10-CM | POA: Diagnosis not present

## 2013-11-08 DIAGNOSIS — S51809A Unspecified open wound of unspecified forearm, initial encounter: Secondary | ICD-10-CM | POA: Diagnosis not present

## 2013-11-08 DIAGNOSIS — Z9181 History of falling: Secondary | ICD-10-CM | POA: Diagnosis not present

## 2013-11-08 DIAGNOSIS — S42309D Unspecified fracture of shaft of humerus, unspecified arm, subsequent encounter for fracture with routine healing: Secondary | ICD-10-CM | POA: Diagnosis not present

## 2013-11-11 DIAGNOSIS — Z4800 Encounter for change or removal of nonsurgical wound dressing: Secondary | ICD-10-CM | POA: Diagnosis not present

## 2013-11-11 DIAGNOSIS — Z9181 History of falling: Secondary | ICD-10-CM | POA: Diagnosis not present

## 2013-11-11 DIAGNOSIS — S51809A Unspecified open wound of unspecified forearm, initial encounter: Secondary | ICD-10-CM | POA: Diagnosis not present

## 2013-11-11 DIAGNOSIS — S42309D Unspecified fracture of shaft of humerus, unspecified arm, subsequent encounter for fracture with routine healing: Secondary | ICD-10-CM | POA: Diagnosis not present

## 2013-11-12 DIAGNOSIS — Z9181 History of falling: Secondary | ICD-10-CM | POA: Diagnosis not present

## 2013-11-12 DIAGNOSIS — S51809A Unspecified open wound of unspecified forearm, initial encounter: Secondary | ICD-10-CM | POA: Diagnosis not present

## 2013-11-12 DIAGNOSIS — S42309D Unspecified fracture of shaft of humerus, unspecified arm, subsequent encounter for fracture with routine healing: Secondary | ICD-10-CM | POA: Diagnosis not present

## 2013-11-12 DIAGNOSIS — Z4800 Encounter for change or removal of nonsurgical wound dressing: Secondary | ICD-10-CM | POA: Diagnosis not present

## 2013-11-13 DIAGNOSIS — S51809A Unspecified open wound of unspecified forearm, initial encounter: Secondary | ICD-10-CM | POA: Diagnosis not present

## 2013-11-13 DIAGNOSIS — Z4800 Encounter for change or removal of nonsurgical wound dressing: Secondary | ICD-10-CM | POA: Diagnosis not present

## 2013-11-13 DIAGNOSIS — S42309D Unspecified fracture of shaft of humerus, unspecified arm, subsequent encounter for fracture with routine healing: Secondary | ICD-10-CM | POA: Diagnosis not present

## 2013-11-13 DIAGNOSIS — Z9181 History of falling: Secondary | ICD-10-CM | POA: Diagnosis not present

## 2013-11-14 DIAGNOSIS — Z4800 Encounter for change or removal of nonsurgical wound dressing: Secondary | ICD-10-CM | POA: Diagnosis not present

## 2013-11-14 DIAGNOSIS — S42309D Unspecified fracture of shaft of humerus, unspecified arm, subsequent encounter for fracture with routine healing: Secondary | ICD-10-CM | POA: Diagnosis not present

## 2013-11-14 DIAGNOSIS — S51809A Unspecified open wound of unspecified forearm, initial encounter: Secondary | ICD-10-CM | POA: Diagnosis not present

## 2013-11-14 DIAGNOSIS — Z9181 History of falling: Secondary | ICD-10-CM | POA: Diagnosis not present

## 2013-11-15 DIAGNOSIS — Z9181 History of falling: Secondary | ICD-10-CM | POA: Diagnosis not present

## 2013-11-15 DIAGNOSIS — Z4800 Encounter for change or removal of nonsurgical wound dressing: Secondary | ICD-10-CM | POA: Diagnosis not present

## 2013-11-15 DIAGNOSIS — S42309D Unspecified fracture of shaft of humerus, unspecified arm, subsequent encounter for fracture with routine healing: Secondary | ICD-10-CM | POA: Diagnosis not present

## 2013-11-15 DIAGNOSIS — S51809A Unspecified open wound of unspecified forearm, initial encounter: Secondary | ICD-10-CM | POA: Diagnosis not present

## 2013-11-18 DIAGNOSIS — S42309D Unspecified fracture of shaft of humerus, unspecified arm, subsequent encounter for fracture with routine healing: Secondary | ICD-10-CM | POA: Diagnosis not present

## 2013-11-18 DIAGNOSIS — S51809A Unspecified open wound of unspecified forearm, initial encounter: Secondary | ICD-10-CM | POA: Diagnosis not present

## 2013-11-18 DIAGNOSIS — Z9181 History of falling: Secondary | ICD-10-CM | POA: Diagnosis not present

## 2013-11-18 DIAGNOSIS — Z4800 Encounter for change or removal of nonsurgical wound dressing: Secondary | ICD-10-CM | POA: Diagnosis not present

## 2013-11-19 DIAGNOSIS — S42309D Unspecified fracture of shaft of humerus, unspecified arm, subsequent encounter for fracture with routine healing: Secondary | ICD-10-CM | POA: Diagnosis not present

## 2013-11-19 DIAGNOSIS — Z4800 Encounter for change or removal of nonsurgical wound dressing: Secondary | ICD-10-CM | POA: Diagnosis not present

## 2013-11-19 DIAGNOSIS — Z9181 History of falling: Secondary | ICD-10-CM | POA: Diagnosis not present

## 2013-11-19 DIAGNOSIS — S51809A Unspecified open wound of unspecified forearm, initial encounter: Secondary | ICD-10-CM | POA: Diagnosis not present

## 2013-11-20 DIAGNOSIS — S51809A Unspecified open wound of unspecified forearm, initial encounter: Secondary | ICD-10-CM | POA: Diagnosis not present

## 2013-11-20 DIAGNOSIS — Z9181 History of falling: Secondary | ICD-10-CM | POA: Diagnosis not present

## 2013-11-20 DIAGNOSIS — Z4800 Encounter for change or removal of nonsurgical wound dressing: Secondary | ICD-10-CM | POA: Diagnosis not present

## 2013-11-20 DIAGNOSIS — S42309D Unspecified fracture of shaft of humerus, unspecified arm, subsequent encounter for fracture with routine healing: Secondary | ICD-10-CM | POA: Diagnosis not present

## 2013-11-22 DIAGNOSIS — Z4800 Encounter for change or removal of nonsurgical wound dressing: Secondary | ICD-10-CM | POA: Diagnosis not present

## 2013-11-22 DIAGNOSIS — Z9181 History of falling: Secondary | ICD-10-CM | POA: Diagnosis not present

## 2013-11-22 DIAGNOSIS — S51809A Unspecified open wound of unspecified forearm, initial encounter: Secondary | ICD-10-CM | POA: Diagnosis not present

## 2013-11-22 DIAGNOSIS — S42309D Unspecified fracture of shaft of humerus, unspecified arm, subsequent encounter for fracture with routine healing: Secondary | ICD-10-CM | POA: Diagnosis not present

## 2013-11-26 DIAGNOSIS — S42309D Unspecified fracture of shaft of humerus, unspecified arm, subsequent encounter for fracture with routine healing: Secondary | ICD-10-CM | POA: Diagnosis not present

## 2013-11-26 DIAGNOSIS — Z9181 History of falling: Secondary | ICD-10-CM | POA: Diagnosis not present

## 2013-11-26 DIAGNOSIS — Z4800 Encounter for change or removal of nonsurgical wound dressing: Secondary | ICD-10-CM | POA: Diagnosis not present

## 2013-11-26 DIAGNOSIS — S51809A Unspecified open wound of unspecified forearm, initial encounter: Secondary | ICD-10-CM | POA: Diagnosis not present

## 2013-11-28 DIAGNOSIS — Z4800 Encounter for change or removal of nonsurgical wound dressing: Secondary | ICD-10-CM | POA: Diagnosis not present

## 2013-11-28 DIAGNOSIS — S42309D Unspecified fracture of shaft of humerus, unspecified arm, subsequent encounter for fracture with routine healing: Secondary | ICD-10-CM | POA: Diagnosis not present

## 2013-11-28 DIAGNOSIS — S51809A Unspecified open wound of unspecified forearm, initial encounter: Secondary | ICD-10-CM | POA: Diagnosis not present

## 2013-11-28 DIAGNOSIS — Z9181 History of falling: Secondary | ICD-10-CM | POA: Diagnosis not present

## 2013-11-29 DIAGNOSIS — Z9181 History of falling: Secondary | ICD-10-CM | POA: Diagnosis not present

## 2013-11-29 DIAGNOSIS — S42309D Unspecified fracture of shaft of humerus, unspecified arm, subsequent encounter for fracture with routine healing: Secondary | ICD-10-CM | POA: Diagnosis not present

## 2013-11-29 DIAGNOSIS — S51809A Unspecified open wound of unspecified forearm, initial encounter: Secondary | ICD-10-CM | POA: Diagnosis not present

## 2013-11-29 DIAGNOSIS — Z4800 Encounter for change or removal of nonsurgical wound dressing: Secondary | ICD-10-CM | POA: Diagnosis not present

## 2013-12-11 DIAGNOSIS — S42309D Unspecified fracture of shaft of humerus, unspecified arm, subsequent encounter for fracture with routine healing: Secondary | ICD-10-CM | POA: Diagnosis not present

## 2013-12-11 DIAGNOSIS — S51809A Unspecified open wound of unspecified forearm, initial encounter: Secondary | ICD-10-CM | POA: Diagnosis not present

## 2013-12-11 DIAGNOSIS — Z9181 History of falling: Secondary | ICD-10-CM | POA: Diagnosis not present

## 2013-12-11 DIAGNOSIS — Z4800 Encounter for change or removal of nonsurgical wound dressing: Secondary | ICD-10-CM | POA: Diagnosis not present

## 2013-12-17 DIAGNOSIS — Z4800 Encounter for change or removal of nonsurgical wound dressing: Secondary | ICD-10-CM | POA: Diagnosis not present

## 2013-12-17 DIAGNOSIS — S51809A Unspecified open wound of unspecified forearm, initial encounter: Secondary | ICD-10-CM | POA: Diagnosis not present

## 2013-12-17 DIAGNOSIS — Z9181 History of falling: Secondary | ICD-10-CM | POA: Diagnosis not present

## 2013-12-17 DIAGNOSIS — S42309D Unspecified fracture of shaft of humerus, unspecified arm, subsequent encounter for fracture with routine healing: Secondary | ICD-10-CM | POA: Diagnosis not present

## 2013-12-18 DIAGNOSIS — S42309D Unspecified fracture of shaft of humerus, unspecified arm, subsequent encounter for fracture with routine healing: Secondary | ICD-10-CM | POA: Diagnosis not present

## 2013-12-20 DIAGNOSIS — S42309D Unspecified fracture of shaft of humerus, unspecified arm, subsequent encounter for fracture with routine healing: Secondary | ICD-10-CM | POA: Diagnosis not present

## 2013-12-20 DIAGNOSIS — S51809A Unspecified open wound of unspecified forearm, initial encounter: Secondary | ICD-10-CM | POA: Diagnosis not present

## 2013-12-20 DIAGNOSIS — Z4800 Encounter for change or removal of nonsurgical wound dressing: Secondary | ICD-10-CM | POA: Diagnosis not present

## 2013-12-20 DIAGNOSIS — Z9181 History of falling: Secondary | ICD-10-CM | POA: Diagnosis not present

## 2013-12-23 DIAGNOSIS — Z6834 Body mass index (BMI) 34.0-34.9, adult: Secondary | ICD-10-CM | POA: Diagnosis not present

## 2013-12-23 DIAGNOSIS — M25519 Pain in unspecified shoulder: Secondary | ICD-10-CM | POA: Diagnosis not present

## 2013-12-23 DIAGNOSIS — G4733 Obstructive sleep apnea (adult) (pediatric): Secondary | ICD-10-CM | POA: Diagnosis not present

## 2013-12-23 DIAGNOSIS — Z1331 Encounter for screening for depression: Secondary | ICD-10-CM | POA: Diagnosis not present

## 2013-12-23 DIAGNOSIS — I10 Essential (primary) hypertension: Secondary | ICD-10-CM | POA: Diagnosis not present

## 2013-12-23 DIAGNOSIS — E039 Hypothyroidism, unspecified: Secondary | ICD-10-CM | POA: Diagnosis not present

## 2013-12-24 DIAGNOSIS — Z9181 History of falling: Secondary | ICD-10-CM | POA: Diagnosis not present

## 2013-12-24 DIAGNOSIS — S42309D Unspecified fracture of shaft of humerus, unspecified arm, subsequent encounter for fracture with routine healing: Secondary | ICD-10-CM | POA: Diagnosis not present

## 2013-12-24 DIAGNOSIS — Z4800 Encounter for change or removal of nonsurgical wound dressing: Secondary | ICD-10-CM | POA: Diagnosis not present

## 2013-12-24 DIAGNOSIS — S51809A Unspecified open wound of unspecified forearm, initial encounter: Secondary | ICD-10-CM | POA: Diagnosis not present

## 2013-12-25 DIAGNOSIS — S51809A Unspecified open wound of unspecified forearm, initial encounter: Secondary | ICD-10-CM | POA: Diagnosis not present

## 2013-12-25 DIAGNOSIS — S42309D Unspecified fracture of shaft of humerus, unspecified arm, subsequent encounter for fracture with routine healing: Secondary | ICD-10-CM | POA: Diagnosis not present

## 2013-12-25 DIAGNOSIS — Z9181 History of falling: Secondary | ICD-10-CM | POA: Diagnosis not present

## 2013-12-25 DIAGNOSIS — Z4800 Encounter for change or removal of nonsurgical wound dressing: Secondary | ICD-10-CM | POA: Diagnosis not present

## 2013-12-27 DIAGNOSIS — Z4800 Encounter for change or removal of nonsurgical wound dressing: Secondary | ICD-10-CM | POA: Diagnosis not present

## 2013-12-27 DIAGNOSIS — Z9181 History of falling: Secondary | ICD-10-CM | POA: Diagnosis not present

## 2013-12-27 DIAGNOSIS — S42309D Unspecified fracture of shaft of humerus, unspecified arm, subsequent encounter for fracture with routine healing: Secondary | ICD-10-CM | POA: Diagnosis not present

## 2013-12-27 DIAGNOSIS — S51809A Unspecified open wound of unspecified forearm, initial encounter: Secondary | ICD-10-CM | POA: Diagnosis not present

## 2013-12-30 DIAGNOSIS — M19019 Primary osteoarthritis, unspecified shoulder: Secondary | ICD-10-CM | POA: Diagnosis not present

## 2013-12-30 DIAGNOSIS — Z4800 Encounter for change or removal of nonsurgical wound dressing: Secondary | ICD-10-CM | POA: Diagnosis not present

## 2013-12-30 DIAGNOSIS — S51809A Unspecified open wound of unspecified forearm, initial encounter: Secondary | ICD-10-CM | POA: Diagnosis not present

## 2013-12-30 DIAGNOSIS — Z9181 History of falling: Secondary | ICD-10-CM | POA: Diagnosis not present

## 2013-12-30 DIAGNOSIS — M25519 Pain in unspecified shoulder: Secondary | ICD-10-CM | POA: Diagnosis not present

## 2013-12-30 DIAGNOSIS — S42309D Unspecified fracture of shaft of humerus, unspecified arm, subsequent encounter for fracture with routine healing: Secondary | ICD-10-CM | POA: Diagnosis not present

## 2014-01-02 DIAGNOSIS — Z4800 Encounter for change or removal of nonsurgical wound dressing: Secondary | ICD-10-CM | POA: Diagnosis not present

## 2014-01-02 DIAGNOSIS — S51809A Unspecified open wound of unspecified forearm, initial encounter: Secondary | ICD-10-CM | POA: Diagnosis not present

## 2014-01-02 DIAGNOSIS — Z9181 History of falling: Secondary | ICD-10-CM | POA: Diagnosis not present

## 2014-01-02 DIAGNOSIS — S42309D Unspecified fracture of shaft of humerus, unspecified arm, subsequent encounter for fracture with routine healing: Secondary | ICD-10-CM | POA: Diagnosis not present

## 2014-01-03 DIAGNOSIS — Z9181 History of falling: Secondary | ICD-10-CM | POA: Diagnosis not present

## 2014-01-03 DIAGNOSIS — S42309D Unspecified fracture of shaft of humerus, unspecified arm, subsequent encounter for fracture with routine healing: Secondary | ICD-10-CM | POA: Diagnosis not present

## 2014-01-03 DIAGNOSIS — Z4800 Encounter for change or removal of nonsurgical wound dressing: Secondary | ICD-10-CM | POA: Diagnosis not present

## 2014-01-03 DIAGNOSIS — S51809A Unspecified open wound of unspecified forearm, initial encounter: Secondary | ICD-10-CM | POA: Diagnosis not present

## 2014-01-10 DIAGNOSIS — S42214D Unspecified nondisplaced fracture of surgical neck of right humerus, subsequent encounter for fracture with routine healing: Secondary | ICD-10-CM | POA: Diagnosis not present

## 2014-01-10 DIAGNOSIS — M19012 Primary osteoarthritis, left shoulder: Secondary | ICD-10-CM | POA: Diagnosis not present

## 2014-01-10 DIAGNOSIS — M25512 Pain in left shoulder: Secondary | ICD-10-CM | POA: Diagnosis not present

## 2014-02-17 DIAGNOSIS — Z008 Encounter for other general examination: Secondary | ICD-10-CM | POA: Diagnosis not present

## 2014-02-17 DIAGNOSIS — I10 Essential (primary) hypertension: Secondary | ICD-10-CM | POA: Diagnosis not present

## 2014-02-17 DIAGNOSIS — E039 Hypothyroidism, unspecified: Secondary | ICD-10-CM | POA: Diagnosis not present

## 2014-02-20 DIAGNOSIS — M25519 Pain in unspecified shoulder: Secondary | ICD-10-CM | POA: Diagnosis not present

## 2014-02-20 DIAGNOSIS — Z008 Encounter for other general examination: Secondary | ICD-10-CM | POA: Diagnosis not present

## 2014-02-20 DIAGNOSIS — G4733 Obstructive sleep apnea (adult) (pediatric): Secondary | ICD-10-CM | POA: Diagnosis not present

## 2014-02-20 DIAGNOSIS — E039 Hypothyroidism, unspecified: Secondary | ICD-10-CM | POA: Diagnosis not present

## 2014-02-20 DIAGNOSIS — I10 Essential (primary) hypertension: Secondary | ICD-10-CM | POA: Diagnosis not present

## 2014-02-20 DIAGNOSIS — R110 Nausea: Secondary | ICD-10-CM | POA: Diagnosis not present

## 2014-02-20 DIAGNOSIS — R269 Unspecified abnormalities of gait and mobility: Secondary | ICD-10-CM | POA: Diagnosis not present

## 2014-02-20 DIAGNOSIS — Z23 Encounter for immunization: Secondary | ICD-10-CM | POA: Diagnosis not present

## 2014-02-21 DIAGNOSIS — Z1212 Encounter for screening for malignant neoplasm of rectum: Secondary | ICD-10-CM | POA: Diagnosis not present

## 2014-04-01 DIAGNOSIS — H2513 Age-related nuclear cataract, bilateral: Secondary | ICD-10-CM | POA: Diagnosis not present

## 2014-04-02 DIAGNOSIS — S42223A 2-part displaced fracture of surgical neck of unspecified humerus, initial encounter for closed fracture: Secondary | ICD-10-CM | POA: Diagnosis not present

## 2014-04-02 DIAGNOSIS — M25532 Pain in left wrist: Secondary | ICD-10-CM | POA: Diagnosis not present

## 2014-04-02 DIAGNOSIS — M259 Joint disorder, unspecified: Secondary | ICD-10-CM | POA: Diagnosis not present

## 2014-08-20 DIAGNOSIS — M19011 Primary osteoarthritis, right shoulder: Secondary | ICD-10-CM | POA: Diagnosis not present

## 2014-08-20 DIAGNOSIS — M19012 Primary osteoarthritis, left shoulder: Secondary | ICD-10-CM | POA: Diagnosis not present

## 2014-08-22 DIAGNOSIS — M25519 Pain in unspecified shoulder: Secondary | ICD-10-CM | POA: Diagnosis not present

## 2014-08-22 DIAGNOSIS — M25561 Pain in right knee: Secondary | ICD-10-CM | POA: Diagnosis not present

## 2014-08-22 DIAGNOSIS — I10 Essential (primary) hypertension: Secondary | ICD-10-CM | POA: Diagnosis not present

## 2014-08-22 DIAGNOSIS — G4733 Obstructive sleep apnea (adult) (pediatric): Secondary | ICD-10-CM | POA: Diagnosis not present

## 2014-08-22 DIAGNOSIS — N3281 Overactive bladder: Secondary | ICD-10-CM | POA: Diagnosis not present

## 2014-08-22 DIAGNOSIS — Z6836 Body mass index (BMI) 36.0-36.9, adult: Secondary | ICD-10-CM | POA: Diagnosis not present

## 2014-10-23 DIAGNOSIS — M25519 Pain in unspecified shoulder: Secondary | ICD-10-CM | POA: Diagnosis not present

## 2014-10-23 DIAGNOSIS — F4321 Adjustment disorder with depressed mood: Secondary | ICD-10-CM | POA: Diagnosis not present

## 2014-10-23 DIAGNOSIS — I10 Essential (primary) hypertension: Secondary | ICD-10-CM | POA: Diagnosis not present

## 2014-10-23 DIAGNOSIS — K5909 Other constipation: Secondary | ICD-10-CM | POA: Diagnosis not present

## 2014-10-23 DIAGNOSIS — G4733 Obstructive sleep apnea (adult) (pediatric): Secondary | ICD-10-CM | POA: Diagnosis not present

## 2014-10-23 DIAGNOSIS — Z6837 Body mass index (BMI) 37.0-37.9, adult: Secondary | ICD-10-CM | POA: Diagnosis not present

## 2014-11-17 DIAGNOSIS — G4700 Insomnia, unspecified: Secondary | ICD-10-CM | POA: Diagnosis not present

## 2014-11-17 DIAGNOSIS — Z6836 Body mass index (BMI) 36.0-36.9, adult: Secondary | ICD-10-CM | POA: Diagnosis not present

## 2014-11-17 DIAGNOSIS — F4321 Adjustment disorder with depressed mood: Secondary | ICD-10-CM | POA: Diagnosis not present

## 2014-11-17 DIAGNOSIS — M545 Low back pain: Secondary | ICD-10-CM | POA: Diagnosis not present

## 2014-11-17 DIAGNOSIS — R269 Unspecified abnormalities of gait and mobility: Secondary | ICD-10-CM | POA: Diagnosis not present

## 2014-11-17 DIAGNOSIS — I10 Essential (primary) hypertension: Secondary | ICD-10-CM | POA: Diagnosis not present

## 2014-11-19 DIAGNOSIS — M545 Low back pain: Secondary | ICD-10-CM | POA: Diagnosis not present

## 2014-11-19 DIAGNOSIS — F4321 Adjustment disorder with depressed mood: Secondary | ICD-10-CM | POA: Diagnosis not present

## 2014-11-19 DIAGNOSIS — Z6834 Body mass index (BMI) 34.0-34.9, adult: Secondary | ICD-10-CM | POA: Diagnosis not present

## 2014-11-19 DIAGNOSIS — I10 Essential (primary) hypertension: Secondary | ICD-10-CM | POA: Diagnosis not present

## 2014-11-19 DIAGNOSIS — R269 Unspecified abnormalities of gait and mobility: Secondary | ICD-10-CM | POA: Diagnosis not present

## 2014-11-26 DIAGNOSIS — M545 Low back pain: Secondary | ICD-10-CM | POA: Diagnosis not present

## 2014-11-26 DIAGNOSIS — M5415 Radiculopathy, thoracolumbar region: Secondary | ICD-10-CM | POA: Diagnosis not present

## 2014-11-26 DIAGNOSIS — M47816 Spondylosis without myelopathy or radiculopathy, lumbar region: Secondary | ICD-10-CM | POA: Diagnosis not present

## 2014-12-02 ENCOUNTER — Encounter: Payer: Self-pay | Admitting: Neurology

## 2014-12-02 ENCOUNTER — Ambulatory Visit (INDEPENDENT_AMBULATORY_CARE_PROVIDER_SITE_OTHER): Payer: Medicare Other | Admitting: Neurology

## 2014-12-02 VITALS — BP 160/94 | HR 66 | Resp 16 | Ht 63.0 in | Wt 206.0 lb

## 2014-12-02 DIAGNOSIS — G4733 Obstructive sleep apnea (adult) (pediatric): Secondary | ICD-10-CM

## 2014-12-02 DIAGNOSIS — G4710 Hypersomnia, unspecified: Secondary | ICD-10-CM | POA: Diagnosis not present

## 2014-12-02 DIAGNOSIS — F1190 Opioid use, unspecified, uncomplicated: Secondary | ICD-10-CM | POA: Diagnosis not present

## 2014-12-02 NOTE — Progress Notes (Signed)
Subjective:    [name] is a 81 y.o. female.  HPI     Star Age, MD, PhD [facility] [address] [place], [place] [address]  Dear Dr. Philip Aspen,  I saw your patient, Lisa Mcclure, upon your kind request in my neurologic clinic today for initial consultation of her sleep disorder, in particular, concern for underlying obstructive sleep apnea. The patient is unaccompanied today. As you know, Lisa Mcclure very pleasant 81 year old right-handed woman with an underlying medical history of reflux disease, hypothyroidism, diverticulosis, low back pain, hypertension, arthritis, fall with fractured ribs and humerus in July 2015 and obesity, who was previously diagnosed about 6 years ago with severe obstructive sleep apnea but was intolerant to CPAP treatment at the time; she has also tried an oral appliance and could not use it either. I reviewed your office note from 10/23/2014. The patient reports difficulty initiating and maintaining sleep. She also tried an oral appliance. Prior sleep study results are not available for my review. She reports excessive daytime somnolence. Of note, she is on multiple, potentially sedating medications including nortriptyline, 25 mg at night, she is on BuSpar 7.5 mg prn, hydrocodone-acetaminophen 1 tablet as needed up to 4 times a day (but takes 1-2 per day typically), but no longer on OxyContin 10 mg twice daily, but no more Robaxin 500 mg twice daily as needed, and no more tramadol, but does take alprazolam 0.25 mg once daily as needed. She previously saw Dr. Halford Chessman in pulmonology, most recently in August 2012 for chronic lung disease, in particular, BOOP. She is significantly bothered by pain at night. In particular, she has problems with frozen shoulders and both knees.  She finds it very hard to be comfortable at night. She tries not to watch TV at night. She likes to read a book. She usually tries  to be in bed around 10 PM but it is not unusual for her to still be awake around 1 AM. She lives alone. Her Epworth sleepiness score 7 out of 24, her fatigue scores 22 out of 63 today. She drinks caffeine in the form of coffee once daily in the morning. She does not drink alcohol frequently. She quit smoking in 1986. She is not keen on coming back for sleep study and considering CPAP treatment. She tried it and feels that she will not be able to tolerate it because of the pressure and the mask and the loud noise of the machine and her claustrophobia.  Her Past Medical History Is Significant For: Past Medical History  Diagnosis Date  . Rheumatoid arthritis(714.0)   . Unspecified essential hypertension   . Esophageal reflux   . Hypothyroidism   . Diverticulosis   . Insomnia   . OSA (obstructive sleep apnea)   . OAB (overactive bladder)   . LBP (low back pain)     Her Past Surgical History Is Significant For: Past Surgical History  Procedure Laterality Date  . Appendectomy    . Vesicovaginal fistula closure w/ tah    . Breast biopsy    . Knee arthroscopy w/ autogenous cartilage implantation (aci) procedure    . Knee arthroscopy    . Shoulder surgery    . Cystocele repair    . Cesarean section    . Wrist surgery    . Total abdominal hysterectomy    . Bladder abduction-1996    . Cosmetic surgery      Her Family History Is  Significant For: Family History  Problem Relation Age of Onset  . COPD Father   . Lung cancer Mother   . Breast cancer Maternal Aunt     Her Social History Is Significant For: Social History   Social History  . Marital Status: Widowed    Spouse Name: N/A  . Number of Children: 3  . Years of Education: College   Occupational History  . Retired    Social History Main Topics  . Smoking status: Former Smoker -- 10 years    Types: Cigarettes    Quit date: 04/11/1984  . Smokeless tobacco: Not on file     Comment: does not recall how many cigs/day she  smoked. Some days did not smoke at all  . Alcohol Use: Yes     Comment: cocktail daily  . Drug Use: No  . Sexual Activity: Not on file   Other Topics Concern  . Not on file   Social History Narrative   Drinks 1 cup of coffee in the morning    Her Allergies Are:  No Known Allergies:   Her Current Medications Are:  Outpatient Encounter Prescriptions as of 12/02/2014  Medication Sig  . ALPRAZolam (XANAX) 0.25 MG tablet Take 0.25 mg by mouth at bedtime as needed for anxiety (anxiety).  Marland Kitchen aspirin 81 MG tablet Take 81 mg by mouth daily.  . busPIRone (BUSPAR) 7.5 MG tablet Take 7.5 mg by mouth 3 (three) times daily.  . cholecalciferol (VITAMIN D) 1000 UNITS tablet Take 1,000 Units by mouth daily.  . cyanocobalamin 100 MCG tablet Take 100 mcg by mouth daily.  Marland Kitchen desmopressin (DDAVP) 0.2 MG tablet Take 0.2 mg by mouth daily.  . furosemide (LASIX) 40 MG tablet Take 40 mg by mouth daily as needed.    Marland Kitchen HYDROcodone-acetaminophen (NORCO) 7.5-325 MG per tablet Take 1 tablet by mouth every 6 (six) hours as needed for moderate pain.  Marland Kitchen levothyroxine (SYNTHROID, LEVOTHROID) 75 MCG tablet Take 75 mcg by mouth daily before breakfast.  . losartan (COZAAR) 100 MG tablet Take 100 mg by mouth daily.  . meloxicam (MOBIC) 15 MG tablet Take 15 mg by mouth daily.  . methocarbamol (ROBAXIN) 500 MG tablet Take 500 mg by mouth daily.  . metoprolol succinate (TOPROL-XL) 25 MG 24 hr tablet Take 25 mg by mouth daily.  . Multiple Vitamin (MULTI-DAY VITAMINS PO) Take by mouth.  . nortriptyline (PAMELOR) 25 MG capsule Take 25 mg by mouth at bedtime.  Marland Kitchen omeprazole (PRILOSEC) 20 MG capsule Take 20 mg by mouth daily.    . OxyCODONE (OXYCONTIN) 10 mg T12A 12 hr tablet Take 10 mg by mouth every 12 (twelve) hours.  . polyethylene glycol (MIRALAX / GLYCOLAX) packet Take 17 g by mouth daily.  . polyethylene glycol powder (GLYCOLAX/MIRALAX) powder TAKE 17 GRAMS IN 8 OUNCES OF FLUID TWICE DAILY  . potassium chloride (K-DUR)  10 MEQ tablet Take 10 mEq by mouth daily.  . sennosides-docusate sodium (SENOKOT-S) 8.6-50 MG tablet Take 1 tablet by mouth daily.  . [DISCONTINUED] amLODipine (NORVASC) 5 MG tablet Take 5 mg by mouth daily.    . [DISCONTINUED] traMADol (ULTRAM) 50 MG tablet Take by mouth every 6 (six) hours as needed.   No facility-administered encounter medications on file as of 12/02/2014.  :  Review of Systems:  Out of a complete 14 point review of systems, all are reviewed and negative with the exception of these symptoms as listed below:   Review of Systems  Cardiovascular: Positive for  leg swelling.  Gastrointestinal: Positive for constipation.       Incontinence  Skin:       Joint pain   Neurological:       History of sleep study.  Trouble falling and staying asleep, Nocturia, trouble getting comfortable, sleepiness and drowsiness durthing sleep.   Pain during sleep, has 2 frozen shoulders.     Objective:  Neurologic Exam  Physical Exam Physical Examination:   Filed Vitals:   12/02/14 1420  BP: 160/94  Pulse: 66  Resp: 16   General Examination: The patient is a very pleasant 81 y.o. female in no acute distress. She appears well-developed and well-nourished and well groomed. She is obese.   HEENT: Normocephalic, atraumatic, pupils are equal, round and reactive to light and accommodation. Funduscopic exam is normal with sharp disc margins noted. Extraocular tracking is good without limitation to gaze excursion or nystagmus noted. Normal smooth pursuit is noted. Hearing is grossly intact. Tympanic membranes are clear bilaterally. Face is symmetric with normal facial animation and normal facial sensation. Speech is clear with no dysarthria noted. There is no hypophonia. There is no lip, neck/head, jaw or voice tremor. Neck is supple with full range of passive and active motion. There are no carotid bruits on auscultation. Oropharynx exam reveals: mild mouth dryness, adequate dental hygiene  and moderate airway crowding, due to narrow airway entry and redundant soft palate. Mallampati is class II. Tongue protrudes centrally and palate elevates symmetrically. Tonsils are absent.  Neck size is 15 inches. She has a Mild overbite. Nasal inspection reveals no significant nasal mucosal bogginess or redness and no septal deviation.   Chest: Clear to auscultation without wheezing, rhonchi or crackles noted.  Heart: S1+S2+0, regular and normal without murmurs, rubs or gallops noted.   Abdomen: Soft, non-tender and non-distended with normal bowel sounds appreciated on auscultation.  Extremities: There is 2+ pitting edema in the distal lower extremities bilaterally. Pedal pulses are intact.  Skin: Warm and dry without trophic changes noted. There are no varicose veins.  Musculoskeletal: exam reveals no obvious joint deformities, tenderness or joint swelling or erythema.   Neurologically:  Mental status: The patient is awake, alert and oriented in all 4 spheres. Her immediate and remote memory, attention, language skills and fund of knowledge are appropriate. There is no evidence of aphasia, agnosia, apraxia or anomia. Speech is clear with normal prosody and enunciation. Thought process is linear. Mood is normal and affect is normal.  Cranial nerves II - XII are as described above under HEENT exam. In addition: shoulder shrug is normal with equal shoulder height noted. Motor exam: Normal bulk, strength and tone is noted. There is no drift, tremor or rebound. Romberg is negative. Reflexes are 1+ in the upper extremities and trace in the lower extremities. Babinski: Toes are flexor bilaterally. Fine motor skills and coordination: intact with normal finger taps, normal hand movements, normal rapid alternating patting, normal foot taps and normal foot agility.  Cerebellar testing: No dysmetria or intention tremor on finger to nose testing. Heel to shin is unremarkable bilaterally. There is no truncal  or gait ataxia.  Sensory exam: intact to light touch in the upper and lower extremities.  Gait, station and balance: She stands with difficulty.posture is slightly stooped. She has lumbar kyphosis. She has a mildly wide-based stance. She walks slowly and cautiously. She turns in 3 steps. Tandem walk is not possible.   Assessment and Plan:   In summary, Mitra Duling is a very  pleasant 81 y.o.-year old female with an underlying medical history of reflux disease, hypothyroidism, diverticulosis, low back pain, hypertension, arthritis, fall with fractured ribs and humerus in July 2015 and obesity, who was previously diagnosed about 6 years ago with severe obstructive sleep apnea but was intolerant to CPAP treatment at the time. Her history and physical exam are in keeping with obstructive sleep apnea. In addition, she has trouble falling asleep and staying asleep and is bothered by chronic pain. She is also on potentially sedating medications which complicate the picture.  I had a long chat with the patient about my findings and the diagnosis of OSA, its prognosis and treatment options. We talked about medical treatments, surgical interventions and non-pharmacological approaches. I explained in particular the risks and ramifications of untreated moderate to severe OSA, especially with respect to developing cardiovascular disease down the Road, including congestive heart failure, difficult to treat hypertension, cardiac arrhythmias, or stroke. Even type 2 diabetes has, in part, been linked to untreated OSA. Symptoms of untreated OSA include daytime sleepiness, memory problems, mood irritability and mood disorder such as depression and anxiety, lack of energy, as well as recurrent headaches, especially morning headaches. We talked about trying to maintain a healthy lifestyle in general, as well as the importance of weight control. I encouraged the patient to eat healthy, exercise daily and keep well hydrated,  to keep a scheduled bedtime and wake time routine, to not skip any meals and eat healthy snacks in between meals. I advised the patient not to drive when feeling sleepy. I recommended the following at this time: sleep study with potential positive airway pressure titration. however, the patient would like to think about it and discuss this again with you. She has significant reservations about coming back for sleep study as she feels that she would not be able to tolerate CPAP therapy again this time around. I did try to reassure the patient that over the course of time patients have been able to tap to treatment as new or machines, out and the current CPAP machines are rather quiet. Also explained to her that there are different mask options available that are less intrusive and even patients with claustrophobia and significant anxiety disorder can adapt to CPAP. Nevertheless, she would like to wait. She is encouraged to call back when she is ready to schedule a sleep study. I answered all her questions today and the patient was in agreement. I will see her back as needed.  Thank you very much for allowing me to participate in the care of this nice patient. If I can be of any further assistance to you please do not hesitate to call me at [phone].  Sincerely,   Star Age, MD, PhD

## 2014-12-02 NOTE — Patient Instructions (Addendum)
Based on your symptoms and your exam I believe you are at risk for obstructive sleep apnea or OSA, and I think we should proceed with a sleep study to determine whether you do or do not have OSA and how severe it is. If you have more than mild OSA, I want you to consider treatment with CPAP. Please remember, the risks and ramifications of moderate to severe obstructive sleep apnea or OSA are: Cardiovascular disease, including congestive heart failure, stroke, difficult to control hypertension, arrhythmias, and even type 2 diabetes has been linked to untreated OSA. Sleep apnea causes disruption of sleep and sleep deprivation in most cases, which, in turn, can cause recurrent headaches, problems with memory, mood, concentration, focus, and vigilance. Most people with untreated sleep apnea report excessive daytime sleepiness, which can affect their ability to drive. Please do not drive if you feel sleepy.   I will likely see you back after your sleep study to go over the test results and where to go from there. We will call you after your sleep study to advise about the results (most likely, you will hear from Beverlee Nims, my nurse) and to set up an appointment at the time, as necessary.    Our sleep lab administrative assistant, Arrie Aran, can call you to schedule your sleep study. Please feel free to call her at [phone]. This is her direct line and please leave a message with your phone number to call back if you get the voicemail box. She will call back as soon as possible.   You have elected to wait and talk this over with Dr. Philip Aspen. I will wait to hear back from you and copy Dr. Philip Aspen on my note today.

## 2014-12-08 DIAGNOSIS — M5415 Radiculopathy, thoracolumbar region: Secondary | ICD-10-CM | POA: Diagnosis not present

## 2014-12-16 DIAGNOSIS — M6281 Muscle weakness (generalized): Secondary | ICD-10-CM | POA: Diagnosis not present

## 2014-12-16 DIAGNOSIS — N3945 Continuous leakage: Secondary | ICD-10-CM | POA: Diagnosis not present

## 2014-12-16 DIAGNOSIS — R351 Nocturia: Secondary | ICD-10-CM | POA: Diagnosis not present

## 2014-12-16 DIAGNOSIS — N3946 Mixed incontinence: Secondary | ICD-10-CM | POA: Diagnosis not present

## 2014-12-16 DIAGNOSIS — R278 Other lack of coordination: Secondary | ICD-10-CM | POA: Diagnosis not present

## 2014-12-22 DIAGNOSIS — M6281 Muscle weakness (generalized): Secondary | ICD-10-CM | POA: Diagnosis not present

## 2014-12-22 DIAGNOSIS — R278 Other lack of coordination: Secondary | ICD-10-CM | POA: Diagnosis not present

## 2014-12-22 DIAGNOSIS — R3912 Poor urinary stream: Secondary | ICD-10-CM | POA: Diagnosis not present

## 2014-12-22 DIAGNOSIS — N3946 Mixed incontinence: Secondary | ICD-10-CM | POA: Diagnosis not present

## 2014-12-22 DIAGNOSIS — R351 Nocturia: Secondary | ICD-10-CM | POA: Diagnosis not present

## 2014-12-22 DIAGNOSIS — R350 Frequency of micturition: Secondary | ICD-10-CM | POA: Diagnosis not present

## 2014-12-30 DIAGNOSIS — M6281 Muscle weakness (generalized): Secondary | ICD-10-CM | POA: Diagnosis not present

## 2014-12-30 DIAGNOSIS — R350 Frequency of micturition: Secondary | ICD-10-CM | POA: Diagnosis not present

## 2014-12-30 DIAGNOSIS — R278 Other lack of coordination: Secondary | ICD-10-CM | POA: Diagnosis not present

## 2014-12-30 DIAGNOSIS — N3946 Mixed incontinence: Secondary | ICD-10-CM | POA: Diagnosis not present

## 2014-12-30 DIAGNOSIS — R351 Nocturia: Secondary | ICD-10-CM | POA: Diagnosis not present

## 2014-12-30 DIAGNOSIS — N3945 Continuous leakage: Secondary | ICD-10-CM | POA: Diagnosis not present

## 2015-01-08 ENCOUNTER — Ambulatory Visit: Payer: Self-pay | Admitting: Podiatry

## 2015-01-09 ENCOUNTER — Encounter: Payer: Self-pay | Admitting: Podiatry

## 2015-01-09 ENCOUNTER — Ambulatory Visit (INDEPENDENT_AMBULATORY_CARE_PROVIDER_SITE_OTHER): Payer: Medicare Other | Admitting: Podiatry

## 2015-01-09 VITALS — BP 157/111 | HR 85 | Resp 16

## 2015-01-09 DIAGNOSIS — L600 Ingrowing nail: Secondary | ICD-10-CM

## 2015-01-09 NOTE — Patient Instructions (Signed)

## 2015-01-09 NOTE — Progress Notes (Signed)
   Subjective:    [name], female    DOB: 03/17/1933, 81 y.o.   MRN: [id number]  HPI    Review of Systems  All other systems reviewed and are negative.      Objective:   Physical Exam        Assessment & Plan:

## 2015-01-09 NOTE — Progress Notes (Signed)
Subjective:     [name], female   DOB: [birth year], 81 y.o.   MRN: [id number]  HPI patient states I been very damaged right big toenail that get sore in the corners a been removed but the rest of the nail is bothering me   Review of Systems  All other systems reviewed and are negative.      Objective:   Physical Exam  Constitutional: She is oriented to person, place, and time.  Cardiovascular: Intact distal pulses.   Musculoskeletal: Normal range of motion.  Neurological: She is oriented to person, place, and time.  Skin: Skin is warm.  Nursing note and vitals reviewed.  neurovascular status found to be intact muscle strength adequate with range of motion within normal limits. Patient's right hallux nail is very incurvated sore and thick and painful when pressed and patient's found to have good digital perfusion and is well oriented 3     Assessment:     Damage right hallux nail secondary to trauma with pain present    Plan:     H&P condition reviewed and recommended nail removal. Patient wants surgery and I explained surgery to patient and she is willing to accept risk I infiltrated 60 Milligan times like Marcaine mixture remove the hallux nail exposed matrix and applied phenol 3 applications 30 seconds followed by alcohol lavage and sterile dressing. Gave instructions on soaks

## 2015-01-13 ENCOUNTER — Telehealth: Payer: Self-pay | Admitting: *Deleted

## 2015-01-13 NOTE — Telephone Encounter (Signed)
Called patient at [phone] (Home #) to check to see how they were doing from a nail that was removed off their toe. Pt stated, "they are doing okay". Pt is soaking toe with some relief.

## 2015-01-14 DIAGNOSIS — R3912 Poor urinary stream: Secondary | ICD-10-CM | POA: Diagnosis not present

## 2015-01-14 DIAGNOSIS — R278 Other lack of coordination: Secondary | ICD-10-CM | POA: Diagnosis not present

## 2015-01-14 DIAGNOSIS — N3945 Continuous leakage: Secondary | ICD-10-CM | POA: Diagnosis not present

## 2015-01-14 DIAGNOSIS — N3946 Mixed incontinence: Secondary | ICD-10-CM | POA: Diagnosis not present

## 2015-01-14 DIAGNOSIS — R351 Nocturia: Secondary | ICD-10-CM | POA: Diagnosis not present

## 2015-01-14 DIAGNOSIS — M6281 Muscle weakness (generalized): Secondary | ICD-10-CM | POA: Diagnosis not present

## 2015-01-19 ENCOUNTER — Telehealth: Payer: Self-pay | Admitting: *Deleted

## 2015-01-19 NOTE — Telephone Encounter (Signed)
Pt states had right big toenail removed 01/09/2015 and it is still sore, and oozy, using Dial antibacterial soap soak.  I told pt to switch to epsom salt soaks, same length of time to soak for 4-6 weeks or until the area gets a dry, hard scab, use antibiotic ointment after each soak, and call with concerns.  Pt states understanding.

## 2015-02-06 DIAGNOSIS — R0609 Other forms of dyspnea: Secondary | ICD-10-CM | POA: Diagnosis not present

## 2015-02-06 DIAGNOSIS — J40 Bronchitis, not specified as acute or chronic: Secondary | ICD-10-CM | POA: Diagnosis not present

## 2015-02-06 DIAGNOSIS — G4733 Obstructive sleep apnea (adult) (pediatric): Secondary | ICD-10-CM | POA: Diagnosis not present

## 2015-02-06 DIAGNOSIS — R05 Cough: Secondary | ICD-10-CM | POA: Diagnosis not present

## 2015-02-06 DIAGNOSIS — I10 Essential (primary) hypertension: Secondary | ICD-10-CM | POA: Diagnosis not present

## 2015-02-13 DIAGNOSIS — I10 Essential (primary) hypertension: Secondary | ICD-10-CM | POA: Diagnosis not present

## 2015-02-13 DIAGNOSIS — F4321 Adjustment disorder with depressed mood: Secondary | ICD-10-CM | POA: Diagnosis not present

## 2015-02-13 DIAGNOSIS — Z23 Encounter for immunization: Secondary | ICD-10-CM | POA: Diagnosis not present

## 2015-02-13 DIAGNOSIS — R0609 Other forms of dyspnea: Secondary | ICD-10-CM | POA: Diagnosis not present

## 2015-02-13 DIAGNOSIS — Z6837 Body mass index (BMI) 37.0-37.9, adult: Secondary | ICD-10-CM | POA: Diagnosis not present

## 2015-02-13 DIAGNOSIS — I872 Venous insufficiency (chronic) (peripheral): Secondary | ICD-10-CM | POA: Diagnosis not present

## 2015-02-19 ENCOUNTER — Encounter: Payer: Self-pay | Admitting: Cardiology

## 2015-02-19 ENCOUNTER — Ambulatory Visit (INDEPENDENT_AMBULATORY_CARE_PROVIDER_SITE_OTHER): Payer: Medicare Other | Admitting: Cardiology

## 2015-02-19 VITALS — BP 144/86 | HR 66 | Ht 63.0 in | Wt 213.0 lb

## 2015-02-19 DIAGNOSIS — I10 Essential (primary) hypertension: Secondary | ICD-10-CM | POA: Diagnosis not present

## 2015-02-19 DIAGNOSIS — I509 Heart failure, unspecified: Secondary | ICD-10-CM | POA: Diagnosis not present

## 2015-02-19 LAB — COMPREHENSIVE METABOLIC PANEL
ALT: 10 U/L (ref 6–29)
AST: 14 U/L (ref 10–35)
Albumin: 4.2 g/dL (ref 3.6–5.1)
Alkaline Phosphatase: 89 U/L (ref 33–130)
BUN: 27 mg/dL — ABNORMAL HIGH (ref 7–25)
CO2: 30 mmol/L (ref 20–31)
Calcium: 9.1 mg/dL (ref 8.6–10.4)
Chloride: 101 mmol/L (ref 98–110)
Creat: 0.77 mg/dL (ref 0.60–0.93)
Glucose, Bld: 101 mg/dL — ABNORMAL HIGH (ref 65–99)
Potassium: 4.3 mmol/L (ref 3.5–5.3)
Sodium: 139 mmol/L (ref 135–146)
Total Bilirubin: 0.6 mg/dL (ref 0.2–1.2)
Total Protein: 7.2 g/dL (ref 6.1–8.1)

## 2015-02-19 LAB — TSH: TSH: 4.847 u[IU]/mL — ABNORMAL HIGH (ref 0.350–4.500)

## 2015-02-19 MED ORDER — FUROSEMIDE 40 MG PO TABS: ORAL_TABLET | ORAL | Status: DC

## 2015-02-19 MED ORDER — AMLODIPINE BESYLATE 2.5 MG PO TABS: 2.5000 mg | ORAL_TABLET | Freq: Every day | ORAL | Status: DC

## 2015-02-19 MED ORDER — POTASSIUM CHLORIDE CRYS ER 20 MEQ PO TBCR: 20.0000 meq | EXTENDED_RELEASE_TABLET | Freq: Every day | ORAL | Status: DC

## 2015-02-19 NOTE — Patient Instructions (Signed)
Medication Instructions:   START TAKING AMLODIPINE 2.5 MG ONCE DAILY  INCREASE YOUR LASIX TO TAKING 80 MG BY MOUTH AT 8 AM, THEN TAKE 40 MG BY MOUTH AT 2 PM EVERYDAY  START TAKING POTASSIUM CHLORIDE 20 mEq ONCE DAILY     Labwork:  TODAY---CMET, TSH, AND BNP     Testing/Procedures:  Your physician has requested that you have an echocardiogram. Echocardiography is a painless test that uses sound waves to create images of your heart. It provides your doctor with information about the size and shape of your heart and how well your heart's chambers and valves are working. This procedure takes approximately one hour. There are no restrictions for this procedure.     Follow-Up:  2 WEEKS WITH A PA/NP IN OUR OFFICE        If you need a refill on your cardiac medications before your next appointment, please call your pharmacy.

## 2015-02-19 NOTE — Progress Notes (Signed)
[name], female   DOB: 10-20-1932, 81 y.o.   MRN: [id number]      Cardiology Office Note  Date:  02/19/2015   ID:  Lisa Mcclure, DOB 1932/10/07, MRN [id number]  PCP:  [name], MD  Cardiologist:   Dorothy Spark, MD   Chief complain: Cough, SOB   History of Present Illness: Lisa Mcclure is a 81 y.o. female who presents for evaluation of cough and DOE. The patient is a very pleasant 81 year old female who lives part of a year in [place]. She has developed 2 episodes of pneumonia in the past and was incidentally found to have residual scarring in the anterior right upper lobe on chest CT.  She now developed non-productive cough with SOB, LE edema and was started on ATB - azithromycin and lasix 80 mg po daily with minimal improvement. Per patient she had a CXR done in her PCP office that showed CHF. She states that she continues feeling SOB, has ongoing LE edema. She denies CP. She was diagnosed with sleep apnea but is unable to tolerate facial mask.    Past Medical History  Diagnosis Date  . Rheumatoid arthritis(714.0)   . Unspecified essential hypertension   . Esophageal reflux   . Hypothyroidism   . Diverticulosis   . Insomnia   . OSA (obstructive sleep apnea)   . OAB (overactive bladder)   . LBP (low back pain)   . DOE (dyspnea on exertion)   . Shoulder pain, bilateral   . GERD (gastroesophageal reflux disease)   . OAB (overactive bladder)   . Depression   . Venous insufficiency   . Cardiomegaly     Past Surgical History  Procedure Laterality Date  . Appendectomy  1953  . Vesicovaginal fistula closure w/ tah    . Breast biopsy Right 1980  . Knee arthroscopy w/ autogenous cartilage implantation (aci) procedure Left 1994, 1995  . Knee arthroscopy Right 1996, 2010  . Shoulder surgery  1990  . Cystocele repair    . Cesarean section      1957, 1961, 1964  . Wrist surgery  1967  . Total abdominal hysterectomy  1972  .  Bladder abduction-1996  1996  . Cosmetic surgery  1996     Current Outpatient Prescriptions  Medication Sig Dispense Refill  . ALPRAZolam (XANAX) 0.25 MG tablet Take 0.25 mg by mouth at bedtime as needed for anxiety (anxiety).    Marland Kitchen aspirin 81 MG tablet Take 81 mg by mouth daily.    . busPIRone (BUSPAR) 7.5 MG tablet Take 7.5 mg by mouth 3 (three) times daily.    . cholecalciferol (VITAMIN D) 1000 UNITS tablet Take 1,000 Units by mouth daily.    . cyanocobalamin 100 MCG tablet Take 100 mcg by mouth daily.    Marland Kitchen desmopressin (DDAVP) 0.2 MG tablet Take 0.2 mg by mouth daily.    . furosemide (LASIX) 40 MG tablet Take 40 mg by mouth daily as needed.      Marland Kitchen HYDROcodone-acetaminophen (NORCO) 7.5-325 MG per tablet Take 1 tablet by mouth every 6 (six) hours as needed for moderate pain.    Marland Kitchen levothyroxine (SYNTHROID, LEVOTHROID) 75 MCG tablet Take 75 mcg by mouth daily before breakfast.    . losartan (COZAAR) 100 MG tablet Take 100 mg by mouth daily.    . meloxicam (MOBIC) 15 MG tablet Take 15 mg by mouth daily.    . methocarbamol (ROBAXIN) 500 MG tablet Take 500 mg  by mouth daily.    . metoprolol (LOPRESSOR) 50 MG tablet Take 50 mg by mouth 2 (two) times daily.    . Multiple Vitamin (MULTI-DAY VITAMINS PO) Take by mouth.    . nortriptyline (PAMELOR) 25 MG capsule Take 25 mg by mouth at bedtime.    Marland Kitchen omeprazole (PRILOSEC) 20 MG capsule Take 20 mg by mouth daily.      . OxyCODONE (OXYCONTIN) 10 mg T12A 12 hr tablet Take 10 mg by mouth every 12 (twelve) hours.    . polyethylene glycol (MIRALAX / GLYCOLAX) packet Take 17 g by mouth daily.    . polyethylene glycol powder (GLYCOLAX/MIRALAX) powder TAKE 17 GRAMS IN 8 OUNCES OF FLUID TWICE DAILY  12  . potassium chloride (K-DUR) 10 MEQ tablet Take 10 mEq by mouth daily.    . sennosides-docusate sodium (SENOKOT-S) 8.6-50 MG tablet Take 1 tablet by mouth daily.     No current facility-administered medications for this visit.   Allergies:   Review of  patient's allergies indicates no known allergies.   Social History:  The patient  reports that she quit smoking about 30 years ago. Her smoking use included Cigarettes. She quit after 10 years of use. She does not have any smokeless tobacco history on file. She reports that she drinks alcohol. She reports that she does not use illicit drugs.   Family History:  The patient's family history includes Breast cancer in her maternal aunt; COPD in her father; Lung cancer in her mother.   ROS:  Please see the history of present illness.   Otherwise, review of systems are positive for none.   All other systems are reviewed and negative.   PHYSICAL EXAM: VS:  Ht 5\' 3"  (1.6 m)  Wt 213 lb (96.616 kg)  BMI 37.74 kg/m2 , BMI Body mass index is 37.74 kg/(m^2). GEN: Well nourished, well developed, in no acute distress HEENT: normal Neck: no JVD, carotid bruits, or masses Cardiac: RRR; no murmurs, rubs, or gallops, B/L edema up to the knees Respiratory:  Crackles B/L at bases, normal work of breathing GI: soft, nontender, nondistended, + BS MS: no deformity or atrophy Skin: warm and dry, no rash Neuro:  Strength and sensation are intact Psych: euthymic mood, full affect  EKG:  EKG is not ordered today. The ekg ordered today demonstrates SR, normal ECG  Recent Labs: No results found for requested labs within last 365 days.   Lipid Panel    Component Value Date/Time   CHOL 226* 12/19/2006 0916   TRIG 132 12/19/2006 0916   HDL 43.9 12/19/2006 0916   CHOLHDL 5.1 CALC 12/19/2006 0916   VLDL 26 12/19/2006 0916   LDLDIRECT 159.2 12/19/2006 0916   Wt Readings from Last 3 Encounters:  02/19/15 213 lb (96.616 kg)  12/02/14 206 lb (93.441 kg)  02/22/13 190 lb (86.183 kg)      ASSESSMENT AND PLAN:  1.  DOE - check echocardiogram for systolic and diastolic function  2. Acute CHF - unknown etiology and type, order echo, increase lasix to 80 mg po in the am and 40 in early afternoon  3.  Hypertension - add amlodipine 2.5 mg po daily  Follow up in 2 weeks.   Signed, Dorothy Spark, MD  02/19/2015 9:47 AM    [facility] [place], [place],   [address] Phone: [phone]; Fax: [phone]

## 2015-02-20 ENCOUNTER — Telehealth: Payer: Self-pay

## 2015-02-20 DIAGNOSIS — R7989 Other specified abnormal findings of blood chemistry: Secondary | ICD-10-CM

## 2015-02-20 LAB — BRAIN NATRIURETIC PEPTIDE: Brain Natriuretic Peptide: 285.8 pg/mL — ABNORMAL HIGH (ref 0.0–100.0)

## 2015-02-20 NOTE — Telephone Encounter (Signed)
-----   Message from Dorothy Spark, MD sent at 02/20/2015  9:06 AM EST ----- Her TSH is abnormal, please order free T3 and free T4

## 2015-02-20 NOTE — Addendum Note (Signed)
Addended by: Newt Minion on: 02/20/2015 09:54 AM   Modules accepted: Orders

## 2015-02-20 NOTE — Telephone Encounter (Signed)
Pt scheduled for free T3 and free T4 to come in on 11/14 for lab work.

## 2015-02-23 ENCOUNTER — Other Ambulatory Visit: Payer: Medicare Other

## 2015-02-23 DIAGNOSIS — M19012 Primary osteoarthritis, left shoulder: Secondary | ICD-10-CM | POA: Diagnosis not present

## 2015-02-23 DIAGNOSIS — S42295D Other nondisplaced fracture of upper end of left humerus, subsequent encounter for fracture with routine healing: Secondary | ICD-10-CM | POA: Diagnosis not present

## 2015-02-24 ENCOUNTER — Other Ambulatory Visit (HOSPITAL_COMMUNITY): Payer: Medicare Other

## 2015-02-25 ENCOUNTER — Other Ambulatory Visit (HOSPITAL_COMMUNITY): Payer: Medicare Other

## 2015-02-27 ENCOUNTER — Telehealth: Payer: Self-pay | Admitting: Cardiology

## 2015-02-27 DIAGNOSIS — E668 Other obesity: Secondary | ICD-10-CM | POA: Diagnosis not present

## 2015-02-27 DIAGNOSIS — M545 Low back pain: Secondary | ICD-10-CM | POA: Diagnosis not present

## 2015-02-27 DIAGNOSIS — I10 Essential (primary) hypertension: Secondary | ICD-10-CM | POA: Diagnosis not present

## 2015-02-27 DIAGNOSIS — I872 Venous insufficiency (chronic) (peripheral): Secondary | ICD-10-CM | POA: Diagnosis not present

## 2015-02-27 DIAGNOSIS — R269 Unspecified abnormalities of gait and mobility: Secondary | ICD-10-CM | POA: Diagnosis not present

## 2015-02-27 DIAGNOSIS — Z1389 Encounter for screening for other disorder: Secondary | ICD-10-CM | POA: Diagnosis not present

## 2015-02-27 DIAGNOSIS — R0609 Other forms of dyspnea: Secondary | ICD-10-CM | POA: Diagnosis not present

## 2015-02-27 DIAGNOSIS — Z0000 Encounter for general adult medical examination without abnormal findings: Secondary | ICD-10-CM | POA: Diagnosis not present

## 2015-02-27 DIAGNOSIS — E038 Other specified hypothyroidism: Secondary | ICD-10-CM | POA: Diagnosis not present

## 2015-02-27 DIAGNOSIS — Z6838 Body mass index (BMI) 38.0-38.9, adult: Secondary | ICD-10-CM | POA: Diagnosis not present

## 2015-02-27 DIAGNOSIS — M79602 Pain in left arm: Secondary | ICD-10-CM | POA: Diagnosis not present

## 2015-02-27 DIAGNOSIS — R350 Frequency of micturition: Secondary | ICD-10-CM | POA: Diagnosis not present

## 2015-02-27 NOTE — Telephone Encounter (Signed)
New message     Pt is due to have labs drawn.  She is at her PCP office now.  They will draw the labs but need an order faxed.  Please fax order to [phone] attn Dr Bevelyn Buckles.  If there is a problem, call daughter back

## 2015-02-27 NOTE — Telephone Encounter (Signed)
Spoke with pt's daughter. Will fax lab order for free T3 and free T4 to Dr. Buel Ream office.  Daughter reports pt broke left shoulder. She is in a sling and arm mobility is very limited.  Unable to lie on left side.  Echo has been cancelled due to this.  Daughter would still like pt to see Ermalinda Barrios, PA on 03/09/15 as planned to follow up on blood pressure.

## 2015-03-02 ENCOUNTER — Telehealth: Payer: Self-pay | Admitting: Cardiology

## 2015-03-02 NOTE — Telephone Encounter (Signed)
Please let her know that I am sorry for what she is going through and I wish her fast recovery. I think that it is totally reasonable to use decreased amount of Lasix for 7-10 days

## 2015-03-02 NOTE — Telephone Encounter (Signed)
New message     Pt c/o medication issue:  1. Name of Medication: furosemide 2. How are you currently taking this medication (dosage and times per day)? 40mg   3. Are you having a reaction (difficulty breathing--STAT)? no  4. What is your medication issue?  Pt has her arm in a sling and cannot move it.  She cannot get to the bathroom quick enough when taking the furosemide.  Also, she has a question about her thyroid medication

## 2015-03-02 NOTE — Telephone Encounter (Signed)
Pt has a broken  left shoulder,she was placed on  arm sling. Pt's arm  has limited mobility, and is not able to move her arm away from her body at all. Pt  is taking Lasix 80 mg at 8 AM and 40 mg at 2:00 Pm, because pt can not move her arm at all. She has problems to go to the restroom on time. She  wet herself every time she needs to void. Pt would like to know if she can take only 40 mg of  lasix in the AM only for a week or so, because she does has help in the AM only, none in the afternoon.   Pt states also the Dr Meda Coffee wanted her to have he free T4 and T3  Check at her PCP Dr. Sharlett Iles office. Pt states  that Dr. Philip Aspen told her  that he had checked her TSH level and he had increased her Thyroid medication from 75 to 100 mg once daily.Marland Kitchen

## 2015-03-02 NOTE — Telephone Encounter (Signed)
Pt is aware that Dr. Meda Coffee okay to decrease the lasix dose for 7 to 10 days, and thanks Dr. Meda Coffee for her kind words for her.Pt verbalized understanding.

## 2015-03-03 ENCOUNTER — Ambulatory Visit: Payer: Medicare Other | Admitting: Cardiology

## 2015-03-06 ENCOUNTER — Emergency Department (HOSPITAL_COMMUNITY): Payer: Medicare Other

## 2015-03-06 ENCOUNTER — Inpatient Hospital Stay (HOSPITAL_COMMUNITY): Payer: Medicare Other

## 2015-03-06 ENCOUNTER — Observation Stay (HOSPITAL_COMMUNITY): Payer: Medicare Other

## 2015-03-06 ENCOUNTER — Encounter (HOSPITAL_COMMUNITY): Payer: Self-pay | Admitting: Emergency Medicine

## 2015-03-06 ENCOUNTER — Inpatient Hospital Stay (HOSPITAL_COMMUNITY)
Admission: EM | Admit: 2015-03-06 | Discharge: 2015-03-10 | DRG: 562 | Disposition: A | Discharge status: Skilled Nursing Facility | Payer: Medicare Other | Attending: Internal Medicine | Admitting: Internal Medicine

## 2015-03-06 DIAGNOSIS — W010XXA Fall on same level from slipping, tripping and stumbling without subsequent striking against object, initial encounter: Secondary | ICD-10-CM | POA: Diagnosis present

## 2015-03-06 DIAGNOSIS — R600 Localized edema: Secondary | ICD-10-CM | POA: Diagnosis not present

## 2015-03-06 DIAGNOSIS — N3281 Overactive bladder: Secondary | ICD-10-CM | POA: Diagnosis present

## 2015-03-06 DIAGNOSIS — Z90710 Acquired absence of both cervix and uterus: Secondary | ICD-10-CM

## 2015-03-06 DIAGNOSIS — S4292XA Fracture of left shoulder girdle, part unspecified, initial encounter for closed fracture: Secondary | ICD-10-CM

## 2015-03-06 DIAGNOSIS — I110 Hypertensive heart disease with heart failure: Secondary | ICD-10-CM | POA: Diagnosis present

## 2015-03-06 DIAGNOSIS — S8002XA Contusion of left knee, initial encounter: Secondary | ICD-10-CM | POA: Diagnosis not present

## 2015-03-06 DIAGNOSIS — M069 Rheumatoid arthritis, unspecified: Secondary | ICD-10-CM | POA: Diagnosis present

## 2015-03-06 DIAGNOSIS — Z801 Family history of malignant neoplasm of trachea, bronchus and lung: Secondary | ICD-10-CM

## 2015-03-06 DIAGNOSIS — S8001XA Contusion of right knee, initial encounter: Secondary | ICD-10-CM | POA: Diagnosis not present

## 2015-03-06 DIAGNOSIS — M8580 Other specified disorders of bone density and structure, unspecified site: Secondary | ICD-10-CM | POA: Diagnosis not present

## 2015-03-06 DIAGNOSIS — M25561 Pain in right knee: Secondary | ICD-10-CM | POA: Diagnosis not present

## 2015-03-06 DIAGNOSIS — R278 Other lack of coordination: Secondary | ICD-10-CM | POA: Diagnosis not present

## 2015-03-06 DIAGNOSIS — M19012 Primary osteoarthritis, left shoulder: Secondary | ICD-10-CM | POA: Diagnosis present

## 2015-03-06 DIAGNOSIS — Z7982 Long term (current) use of aspirin: Secondary | ICD-10-CM

## 2015-03-06 DIAGNOSIS — Z87891 Personal history of nicotine dependence: Secondary | ICD-10-CM | POA: Diagnosis not present

## 2015-03-06 DIAGNOSIS — S42392A Other fracture of shaft of left humerus, initial encounter for closed fracture: Secondary | ICD-10-CM | POA: Diagnosis not present

## 2015-03-06 DIAGNOSIS — M6281 Muscle weakness (generalized): Secondary | ICD-10-CM | POA: Diagnosis not present

## 2015-03-06 DIAGNOSIS — S82002A Unspecified fracture of left patella, initial encounter for closed fracture: Secondary | ICD-10-CM

## 2015-03-06 DIAGNOSIS — G4733 Obstructive sleep apnea (adult) (pediatric): Secondary | ICD-10-CM | POA: Diagnosis present

## 2015-03-06 DIAGNOSIS — Z6837 Body mass index (BMI) 37.0-37.9, adult: Secondary | ICD-10-CM

## 2015-03-06 DIAGNOSIS — S82092D Other fracture of left patella, subsequent encounter for closed fracture with routine healing: Secondary | ICD-10-CM | POA: Diagnosis not present

## 2015-03-06 DIAGNOSIS — F329 Major depressive disorder, single episode, unspecified: Secondary | ICD-10-CM | POA: Diagnosis present

## 2015-03-06 DIAGNOSIS — S6992XA Unspecified injury of left wrist, hand and finger(s), initial encounter: Secondary | ICD-10-CM | POA: Diagnosis not present

## 2015-03-06 DIAGNOSIS — R0609 Other forms of dyspnea: Secondary | ICD-10-CM

## 2015-03-06 DIAGNOSIS — S82009A Unspecified fracture of unspecified patella, initial encounter for closed fracture: Secondary | ICD-10-CM | POA: Diagnosis present

## 2015-03-06 DIAGNOSIS — R296 Repeated falls: Secondary | ICD-10-CM

## 2015-03-06 DIAGNOSIS — Z825 Family history of asthma and other chronic lower respiratory diseases: Secondary | ICD-10-CM

## 2015-03-06 DIAGNOSIS — G8929 Other chronic pain: Secondary | ICD-10-CM | POA: Diagnosis present

## 2015-03-06 DIAGNOSIS — S4992XA Unspecified injury of left shoulder and upper arm, initial encounter: Secondary | ICD-10-CM | POA: Diagnosis not present

## 2015-03-06 DIAGNOSIS — E890 Postprocedural hypothyroidism: Secondary | ICD-10-CM | POA: Diagnosis present

## 2015-03-06 DIAGNOSIS — S79911A Unspecified injury of right hip, initial encounter: Secondary | ICD-10-CM | POA: Diagnosis not present

## 2015-03-06 DIAGNOSIS — M25562 Pain in left knee: Secondary | ICD-10-CM | POA: Diagnosis not present

## 2015-03-06 DIAGNOSIS — M545 Low back pain: Secondary | ICD-10-CM | POA: Diagnosis present

## 2015-03-06 DIAGNOSIS — K5790 Diverticulosis of intestine, part unspecified, without perforation or abscess without bleeding: Secondary | ICD-10-CM | POA: Diagnosis present

## 2015-03-06 DIAGNOSIS — E669 Obesity, unspecified: Secondary | ICD-10-CM | POA: Diagnosis present

## 2015-03-06 DIAGNOSIS — N3944 Nocturnal enuresis: Secondary | ICD-10-CM | POA: Diagnosis present

## 2015-03-06 DIAGNOSIS — Z9181 History of falling: Secondary | ICD-10-CM | POA: Diagnosis not present

## 2015-03-06 DIAGNOSIS — I10 Essential (primary) hypertension: Secondary | ICD-10-CM | POA: Diagnosis not present

## 2015-03-06 DIAGNOSIS — R0602 Shortness of breath: Secondary | ICD-10-CM | POA: Diagnosis not present

## 2015-03-06 DIAGNOSIS — Z8673 Personal history of transient ischemic attack (TIA), and cerebral infarction without residual deficits: Secondary | ICD-10-CM | POA: Diagnosis not present

## 2015-03-06 DIAGNOSIS — S299XXA Unspecified injury of thorax, initial encounter: Secondary | ICD-10-CM | POA: Diagnosis not present

## 2015-03-06 DIAGNOSIS — K5900 Constipation, unspecified: Secondary | ICD-10-CM | POA: Diagnosis present

## 2015-03-06 DIAGNOSIS — K219 Gastro-esophageal reflux disease without esophagitis: Secondary | ICD-10-CM | POA: Diagnosis present

## 2015-03-06 DIAGNOSIS — T148 Other injury of unspecified body region: Secondary | ICD-10-CM | POA: Diagnosis not present

## 2015-03-06 DIAGNOSIS — R079 Chest pain, unspecified: Secondary | ICD-10-CM | POA: Diagnosis not present

## 2015-03-06 DIAGNOSIS — I5031 Acute diastolic (congestive) heart failure: Secondary | ICD-10-CM | POA: Diagnosis present

## 2015-03-06 DIAGNOSIS — N393 Stress incontinence (female) (male): Secondary | ICD-10-CM | POA: Diagnosis present

## 2015-03-06 DIAGNOSIS — M25532 Pain in left wrist: Secondary | ICD-10-CM | POA: Diagnosis not present

## 2015-03-06 DIAGNOSIS — E039 Hypothyroidism, unspecified: Secondary | ICD-10-CM | POA: Diagnosis present

## 2015-03-06 DIAGNOSIS — S7010XA Contusion of unspecified thigh, initial encounter: Secondary | ICD-10-CM | POA: Diagnosis not present

## 2015-03-06 DIAGNOSIS — I872 Venous insufficiency (chronic) (peripheral): Secondary | ICD-10-CM | POA: Diagnosis present

## 2015-03-06 DIAGNOSIS — W19XXXA Unspecified fall, initial encounter: Secondary | ICD-10-CM | POA: Insufficient documentation

## 2015-03-06 DIAGNOSIS — S8991XA Unspecified injury of right lower leg, initial encounter: Secondary | ICD-10-CM | POA: Diagnosis not present

## 2015-03-06 DIAGNOSIS — R269 Unspecified abnormalities of gait and mobility: Secondary | ICD-10-CM

## 2015-03-06 DIAGNOSIS — Z803 Family history of malignant neoplasm of breast: Secondary | ICD-10-CM

## 2015-03-06 DIAGNOSIS — R609 Edema, unspecified: Secondary | ICD-10-CM

## 2015-03-06 DIAGNOSIS — R262 Difficulty in walking, not elsewhere classified: Secondary | ICD-10-CM | POA: Diagnosis not present

## 2015-03-06 DIAGNOSIS — R259 Unspecified abnormal involuntary movements: Secondary | ICD-10-CM | POA: Diagnosis not present

## 2015-03-06 DIAGNOSIS — S79912A Unspecified injury of left hip, initial encounter: Secondary | ICD-10-CM | POA: Diagnosis not present

## 2015-03-06 DIAGNOSIS — R0600 Dyspnea, unspecified: Secondary | ICD-10-CM | POA: Diagnosis present

## 2015-03-06 DIAGNOSIS — S82001A Unspecified fracture of right patella, initial encounter for closed fracture: Secondary | ICD-10-CM | POA: Diagnosis not present

## 2015-03-06 DIAGNOSIS — S8992XA Unspecified injury of left lower leg, initial encounter: Secondary | ICD-10-CM | POA: Diagnosis not present

## 2015-03-06 DIAGNOSIS — Z4789 Encounter for other orthopedic aftercare: Secondary | ICD-10-CM | POA: Diagnosis not present

## 2015-03-06 DIAGNOSIS — R0789 Other chest pain: Secondary | ICD-10-CM | POA: Diagnosis not present

## 2015-03-06 HISTORY — DX: Unspecified osteoarthritis, unspecified site: M19.90

## 2015-03-06 LAB — CBC WITH DIFFERENTIAL/PLATELET
BASOS ABS: 0 10*3/uL (ref 0.0–0.1)
BASOS PCT: 0 %
Eosinophils Absolute: 0.3 10*3/uL (ref 0.0–0.7)
Eosinophils Relative: 3 %
HEMATOCRIT: 41.6 % (ref 36.0–46.0)
HEMOGLOBIN: 13.2 g/dL (ref 12.0–15.0)
LYMPHS PCT: 21 %
Lymphs Abs: 1.8 10*3/uL (ref 0.7–4.0)
MCH: 30.1 pg (ref 26.0–34.0)
MCHC: 31.7 g/dL (ref 30.0–36.0)
MCV: 94.8 fL (ref 78.0–100.0)
MONO ABS: 0.6 10*3/uL (ref 0.1–1.0)
Monocytes Relative: 6 %
NEUTROS ABS: 6.2 10*3/uL (ref 1.7–7.7)
NEUTROS PCT: 70 %
Platelets: 351 10*3/uL (ref 150–400)
RBC: 4.39 MIL/uL (ref 3.87–5.11)
RDW: 13.1 % (ref 11.5–15.5)
WBC: 8.9 10*3/uL (ref 4.0–10.5)

## 2015-03-06 LAB — COMPREHENSIVE METABOLIC PANEL
ALBUMIN: 3.8 g/dL (ref 3.5–5.0)
ALK PHOS: 97 U/L (ref 38–126)
ALT: 12 U/L — AB (ref 14–54)
AST: 19 U/L (ref 15–41)
Anion gap: 10 (ref 5–15)
BILIRUBIN TOTAL: 0.6 mg/dL (ref 0.3–1.2)
BUN: 18 mg/dL (ref 6–20)
CALCIUM: 9.1 mg/dL (ref 8.9–10.3)
CO2: 27 mmol/L (ref 22–32)
CREATININE: 0.91 mg/dL (ref 0.44–1.00)
Chloride: 100 mmol/L — ABNORMAL LOW (ref 101–111)
GFR calc Af Amer: >60 mL/min (ref >60)
GFR, EST NON AFRICAN AMERICAN: 58 mL/min — AB (ref >60)
GLUCOSE: 93 mg/dL (ref 65–99)
Potassium: 4.6 mmol/L (ref 3.5–5.1)
Sodium: 137 mmol/L (ref 135–145)
TOTAL PROTEIN: 7.3 g/dL (ref 6.5–8.1)

## 2015-03-06 LAB — PROTIME-INR
INR: 1.19 (ref 0.00–1.49)
Prothrombin Time: 15.3 seconds — ABNORMAL HIGH (ref 11.6–15.2)

## 2015-03-06 MED ORDER — ONDANSETRON HCL 4 MG/2ML IJ SOLN: 4.0000 mg | Freq: Four times a day (QID) | INTRAMUSCULAR | Status: DC | PRN

## 2015-03-06 MED ORDER — ONDANSETRON HCL 4 MG/2ML IJ SOLN
4.0000 mg | Freq: Once | INTRAMUSCULAR | Status: AC
  Administered 2015-03-06: 4 mg via INTRAVENOUS
  Filled 2015-03-06: qty 2

## 2015-03-06 MED ORDER — FUROSEMIDE 40 MG PO TABS
40.0000 mg | ORAL_TABLET | Freq: Every day | ORAL | Status: DC
  Administered 2015-03-07 – 2015-03-10 (×4): 40 mg via ORAL
  Filled 2015-03-06 (×4): qty 1

## 2015-03-06 MED ORDER — POTASSIUM CHLORIDE CRYS ER 20 MEQ PO TBCR
20.0000 meq | EXTENDED_RELEASE_TABLET | Freq: Every day | ORAL | Status: DC
Start: 2015-03-06 — End: 2015-03-10
  Administered 2015-03-06 – 2015-03-10 (×5): 20 meq via ORAL
  Filled 2015-03-06 (×5): qty 1

## 2015-03-06 MED ORDER — ONDANSETRON HCL 4 MG PO TABS: 4.0000 mg | ORAL_TABLET | Freq: Four times a day (QID) | ORAL | Status: DC | PRN

## 2015-03-06 MED ORDER — DESMOPRESSIN ACETATE 0.2 MG PO TABS
0.2000 mg | ORAL_TABLET | Freq: Every day | ORAL | Status: DC
  Administered 2015-03-06 – 2015-03-09 (×4): 0.2 mg via ORAL
  Filled 2015-03-06 (×6): qty 1

## 2015-03-06 MED ORDER — OXYCODONE HCL 5 MG PO TABS
5.0000 mg | ORAL_TABLET | ORAL | Status: DC | PRN
  Administered 2015-03-06 – 2015-03-10 (×17): 5 mg via ORAL
  Filled 2015-03-06 (×17): qty 1

## 2015-03-06 MED ORDER — LOSARTAN POTASSIUM 50 MG PO TABS
100.0000 mg | ORAL_TABLET | Freq: Every day | ORAL | Status: DC
  Administered 2015-03-07 – 2015-03-08 (×2): 100 mg via ORAL
  Filled 2015-03-06 (×3): qty 2

## 2015-03-06 MED ORDER — BUSPIRONE HCL 5 MG PO TABS
7.5000 mg | ORAL_TABLET | Freq: Three times a day (TID) | ORAL | Status: DC
  Administered 2015-03-06 – 2015-03-10 (×11): 7.5 mg via ORAL
  Filled 2015-03-06 (×11): qty 2

## 2015-03-06 MED ORDER — ASPIRIN EC 81 MG PO TBEC
81.0000 mg | DELAYED_RELEASE_TABLET | Freq: Every day | ORAL | Status: DC
  Administered 2015-03-06 – 2015-03-10 (×5): 81 mg via ORAL
  Filled 2015-03-06 (×5): qty 1

## 2015-03-06 MED ORDER — LEVOTHYROXINE SODIUM 100 MCG PO TABS
100.0000 ug | ORAL_TABLET | Freq: Every day | ORAL | Status: DC
  Administered 2015-03-07 – 2015-03-10 (×4): 100 ug via ORAL
  Filled 2015-03-06 (×5): qty 1

## 2015-03-06 MED ORDER — MELOXICAM 15 MG PO TABS
15.0000 mg | ORAL_TABLET | Freq: Every day | ORAL | Status: DC
  Administered 2015-03-06: 15 mg via ORAL
  Filled 2015-03-06: qty 1
  Filled 2015-03-06: qty 2
  Filled 2015-03-06 (×2): qty 1
  Filled 2015-03-06: qty 2
  Filled 2015-03-06: qty 1
  Filled 2015-03-06: qty 2

## 2015-03-06 MED ORDER — POLYETHYLENE GLYCOL 3350 17 G PO PACK
17.0000 g | PACK | Freq: Every day | ORAL | Status: DC
  Administered 2015-03-07 – 2015-03-10 (×3): 17 g via ORAL
  Filled 2015-03-06 (×5): qty 1

## 2015-03-06 MED ORDER — ENOXAPARIN SODIUM 40 MG/0.4ML ~~LOC~~ SOLN
40.0000 mg | SUBCUTANEOUS | Status: DC
  Administered 2015-03-06 – 2015-03-09 (×4): 40 mg via SUBCUTANEOUS
  Filled 2015-03-06 (×4): qty 0.4

## 2015-03-06 MED ORDER — NORTRIPTYLINE HCL 25 MG PO CAPS
25.0000 mg | ORAL_CAPSULE | Freq: Every day | ORAL | Status: DC
  Administered 2015-03-06 – 2015-03-09 (×4): 25 mg via ORAL
  Filled 2015-03-06 (×5): qty 1

## 2015-03-06 MED ORDER — SODIUM CHLORIDE 0.9 % IV SOLN
INTRAVENOUS | Status: DC
  Administered 2015-03-06: 12:00:00 via INTRAVENOUS

## 2015-03-06 MED ORDER — MORPHINE SULFATE (PF) 2 MG/ML IV SOLN
2.0000 mg | Freq: Once | INTRAVENOUS | Status: AC
  Administered 2015-03-06: 2 mg via INTRAVENOUS
  Filled 2015-03-06: qty 1

## 2015-03-06 MED ORDER — ACETAMINOPHEN 650 MG RE SUPP: 650.0000 mg | Freq: Four times a day (QID) | RECTAL | Status: DC | PRN

## 2015-03-06 MED ORDER — PANTOPRAZOLE SODIUM 40 MG PO TBEC
40.0000 mg | DELAYED_RELEASE_TABLET | Freq: Every day | ORAL | Status: DC
Start: 2015-03-06 — End: 2015-03-10
  Administered 2015-03-06 – 2015-03-10 (×5): 40 mg via ORAL
  Filled 2015-03-06 (×4): qty 1

## 2015-03-06 MED ORDER — METOPROLOL TARTRATE 50 MG PO TABS
50.0000 mg | ORAL_TABLET | Freq: Two times a day (BID) | ORAL | Status: DC
  Administered 2015-03-06 – 2015-03-10 (×8): 50 mg via ORAL
  Filled 2015-03-06 (×8): qty 1

## 2015-03-06 MED ORDER — MORPHINE SULFATE (PF) 2 MG/ML IV SOLN
1.0000 mg | INTRAVENOUS | Status: DC | PRN
  Administered 2015-03-06 – 2015-03-09 (×6): 1 mg via INTRAVENOUS
  Filled 2015-03-06 (×6): qty 1

## 2015-03-06 MED ORDER — ACETAMINOPHEN 325 MG PO TABS
650.0000 mg | ORAL_TABLET | Freq: Four times a day (QID) | ORAL | Status: DC | PRN
  Administered 2015-03-08: 650 mg via ORAL
  Filled 2015-03-06: qty 2

## 2015-03-06 MED ORDER — AMLODIPINE BESYLATE 2.5 MG PO TABS
2.5000 mg | ORAL_TABLET | Freq: Every day | ORAL | Status: DC
  Filled 2015-03-06: qty 1

## 2015-03-06 NOTE — ED Provider Notes (Signed)
The patient has been evaluated by PT/OT. Per [name], patient and family members they have decided 24 hour obs for placement would be int he patients best interest. They understand that patient will be responsible for 20 % of the costs. [name], PA-C has agreed to admit patient for obs status to the hospital. Dr. Tomi Bamberger has seen patient and discussed this as well.  Filed Vitals:   03/06/15 1030 03/06/15 1230  BP: 161/65 155/66  Pulse: 65 69  Temp:    Resp: [address], PA-C 03/06/15 1504

## 2015-03-06 NOTE — Evaluation (Signed)
Physical Therapy Evaluation Patient Details Name: Lisa Mcclure MRN: [id number] DOB: Apr 21, 1932 Today's Date: 03/06/2015   History of Present Illness  pt presents with multiple falls sustaining L "shoulder" fx 1 week ago and fell on Thanksgiving Day resulting in L Patella fx.  pt with hx of falls, bil wrist fxs, L rib fxs, R TKA, RA, THN, Depression, and OSA.    Clinical Impression  Pt clearly debilitated and requiring A for all aspects of mobility.  Pt only able to ambulate 10' with 2 person A due to inability to use L shoulder and with knee immobilizer on L LE.  Feel pt is not safe for return to home and family is unable to provide necessary level of care for pt is she did return to home.  Pt will need SNF for further rehab to maximize independence and decrease burden of care.  If pt is denied SNF stay then she would benefit from [place] services and having private duty care in order to provide 24hr caregivers.  Will continue to follow.      Follow Up Recommendations SNF    Equipment Recommendations   (If pt cleared to WB on L UE then Platform RW)    Recommendations for Other Services       Precautions / Restrictions Precautions Precautions: Fall Required Braces or Orthoses: Knee Immobilizer - Left;Sling Knee Immobilizer - Left: On at all times Restrictions Weight Bearing Restrictions: Yes LLE Weight Bearing: Weight bearing as tolerated Other Position/Activity Restrictions: No orders for L UE, but maintained NWBing and in sling.      Mobility  Bed Mobility Overal bed mobility: Needs Assistance Bed Mobility: Supine to Sit;Sit to Supine     Supine to sit: Mod assist;+2 for physical assistance;HOB elevated Sit to supine: Mod assist;+2 for physical assistance   General bed mobility comments: A for trunk and bringing L LE in/out of bed.  cues for sequencing and technique.    Transfers Overall transfer level: Needs assistance Equipment used: 1 person hand held  assist Transfers: Sit to/from [facility] Sit to Stand: Min assist;+2 physical assistance Stand pivot transfers: Min assist;+2 physical assistance       General transfer comment: cues for use of R UE and positioning of L LE with transfers.  pt needs A for pivoting to 3-in-1 and total A for hygiene after urinating.    Ambulation/Gait Ambulation/Gait assistance: Min assist;+2 physical assistance Ambulation Distance (Feet): 10 Feet Assistive device: 1 person hand held assist Gait Pattern/deviations: Step-through pattern;Decreased stride length;Wide base of support     General Gait Details: pt unsteady and generally weak.  pt requires A for balance and fatigues quickly.  pt unable to use L UE at this time due to L shoulder fx and sling.    Stairs            Wheelchair Mobility    Modified Rankin (Stroke Patients Only)       Balance Overall balance assessment: Needs assistance;History of Falls Sitting-balance support: Single extremity supported;Feet supported Sitting balance-Leahy Scale: Fair     Standing balance support: Single extremity supported;During functional activity Standing balance-Leahy Scale: Poor                               Pertinent Vitals/Pain Pain Assessment: 0-10 Pain Score: 7  Pain Location: L shoulder Pain Descriptors / Indicators: Aching Pain Intervention(s): Monitored during session;Premedicated before session;Repositioned    Home  Living Family/patient expects to be discharged to:: Private residence Living Arrangements: Alone Available Help at Discharge: Family;Available PRN/intermittently (can only check on pt) Type of Home: House       Home Layout: One level Home Equipment: Cane - single point Additional Comments: pt's daughter is CG for her husband who had a recent surgery.      Prior Function Level of Independence: Independent with assistive device(s)         Comments: Occasionally used cane on  outdoor surfaces.     Hand Dominance        Extremity/Trunk Assessment   Upper Extremity Assessment: Generalized weakness;LUE deficits/detail       LUE Deficits / Details: Maintained in sling and NWBing throughout session.  Sensation intact.   Lower Extremity Assessment: Generalized weakness;LLE deficits/detail   LLE Deficits / Details: Maintained in Knee Immobilizer throughout session.  Sensation intact.    Cervical / Trunk Assessment: Kyphotic  Communication   Communication: No difficulties  Cognition Arousal/Alertness: Awake/alert Behavior During Therapy: WFL for tasks assessed/performed Overall Cognitive Status: Within Functional Limits for tasks assessed                      General Comments      Exercises        Assessment/Plan    PT Assessment Patient needs continued PT services  PT Diagnosis Difficulty walking;Acute pain   PT Problem List Decreased strength;Decreased activity tolerance;Decreased balance;Decreased mobility;Decreased coordination;Decreased knowledge of use of DME;Pain;Obesity  PT Treatment Interventions DME instruction;Gait training;Stair training;Functional mobility training;Therapeutic activities;Therapeutic exercise;Balance training;Patient/family education   PT Goals (Current goals can be found in the Care Plan section) Acute Rehab PT Goals Patient Stated Goal: To stop falling. PT Goal Formulation: With patient/family Time For Goal Achievement: 03/20/15 Potential to Achieve Goals: Good    Frequency Min 3X/week   Barriers to discharge Decreased caregiver support pt lives alone and her daughter is already caring for her husband.      Co-evaluation               End of Session Equipment Utilized During Treatment: Gait belt;Left knee immobilizer (L UE sling) Activity Tolerance: Patient limited by fatigue;Patient limited by pain Patient left: in bed;with call bell/phone within reach;with family/visitor present Nurse  Communication: Mobility status    Functional Assessment Tool Used: Clinical Judgement Functional Limitation: Mobility: Walking and moving around Mobility: Walking and Moving Around Current Status [id number]): At least 40 percent but less than 60 percent impaired, limited or restricted Mobility: Walking and Moving Around Goal Status [phone]): At least 1 percent but less than 20 percent impaired, limited or restricted    Time: 1357-1447 PT Time Calculation (min) (ACUTE ONLY): 50 min   Charges:   PT Evaluation $Initial PT Evaluation Tier I: 1 Procedure PT Treatments $Gait Training: 8-22 mins $Therapeutic Activity: 8-22 mins   PT G Codes:   PT G-Codes **NOT FOR INPATIENT CLASS** Functional Assessment Tool Used: Clinical Judgement Functional Limitation: Mobility: Walking and moving around Mobility: Walking and Moving Around Current Status [id number]): At least 40 percent but less than 60 percent impaired, limited or restricted Mobility: Walking and Moving Around Goal Status [phone]): At least 1 percent but less than 20 percent impaired, limited or restricted    Catarina Hartshorn, [place] 03/06/2015, 3:21 PM

## 2015-03-06 NOTE — H&P (Signed)
[facility] History and Physical                                                                                    Lisa Mcclure, is a 81 y.o. female  MRN: [id number]   DOB - 02/02/1933  Admit Date - 03/06/2015  Outpatient Primary MD for the patient is [name], MD  Referring MD: Tomi Bamberger / ER  With History of -  Past Medical History  Diagnosis Date  . Rheumatoid arthritis(714.0)   . Unspecified essential hypertension   . Esophageal reflux   . Hypothyroidism   . Diverticulosis   . Insomnia   . OSA (obstructive sleep apnea)   . OAB (overactive bladder)   . LBP (low back pain)   . DOE (dyspnea on exertion)   . Shoulder pain, bilateral   . GERD (gastroesophageal reflux disease)   . OAB (overactive bladder)   . Depression   . Venous insufficiency   . Cardiomegaly   . Osteoarthritis       Past Surgical History  Procedure Laterality Date  . Appendectomy  1953  . Vesicovaginal fistula closure w/ tah    . Breast biopsy Right 1980  . Knee arthroscopy w/ autogenous cartilage implantation (aci) procedure Left 1994, 1995  . Knee arthroscopy Right 1996, 2010  . Shoulder surgery  1990  . Cystocele repair    . Cesarean section      1957, 1961, 1964  . Wrist surgery  1967  . Total abdominal hysterectomy  1972  . Bladder abduction-1996  1996  . Cosmetic surgery  1996    in for   Chief Complaint  Patient presents with  . Shoulder Pain     HPI This is an 81 year old female patient with past medical history of hypertension on 3 medications, sleep apnea on CPAP, and surgical hypothyroidism. She has recently been evaluated by cardiology as an outpatient because of lower extremity edema with cough and dyspnea on exertion. Outpatient echocardiogram was planned but patient unable to pursue secondary to issues with recent fall with shoulder injury last week. She was evaluated by orthopedic physician at that time and was placed in upper extremity sling. Patient  reports that since injuring her arm she's had difficulty with managing around the house especially trying to get her underclothing up and down when going to the bathroom (recent start on Lasix by cardiology). On Thanksgiving morning she became distracted and fell forward landing on her knees and chest area and presented to the ER because of pain in the bilateral knees, left wrist, left shoulder and sternum. Because it was Thanksgiving the patient chose not to present to the ER yesterday. She does have medications that she takes at home for arthritis including Vicodin and she opted to take that medication along with application of ice to the knees. This morning the pain was worse and she was unable to walk so she called her primary care physician who recommended she come to the ER for further evaluation. She denied any loss of consciousness, head or neck injuries and no pain in her hips.  In the ER the patient was mildly hypertensive with a blood  pressure 161/65, she was afebrile, pulse was regular respirations were regular with room air saturations 98%. Patient underwent multiple x-rays including x-ray of the left wrist left shoulder, 2 view chest x-ray, bilateral hip and pelvis x-rays, bilateral knee x-rays all of which were negative except for left knee x-ray which revealed an acute nondisplaced fracture of the mid patella. Patient was evaluated by PT in the ER; despite working with the patient she was unable to safely navigate/manage ambulation while in the ER secondary to severe pain causing her to have inability to ambulate in addition to the previous arm injury requiring sling immobilization. It was recommended that she be admitted for pain control and continued physical therapy with possibility of being admitted to a skilled nurse facility for rehabilitative therapies. In the ER her laboratory data was unremarkable.  During my time of taking history with patient she reports some other concerning symptoms  that occurred since mid summer. She reports that in July while sitting still she noticed some left-sided numbness which lasted for a period of time but then resolved without recurrence. It seems at the same time her blood pressure had been elevated and not well controlled. These findings were discussed with her primary care physician. In the past several months she's had at least one significant episode of difficulty in word finding and this also appeared to be related to when her blood pressure was elevated. As noted she's had recent issues with lower extremity edema and dyspnea on exertion and outpatient evaluation to determine if she has underlying CHF is in process. Patient was started on twice a day Lasix by the cardiologist and has had good urinary output. She did request a dosage change after her shoulder injury because she was having difficulty getting her underclothing up and down after going to the bathroom. The cardiologist has temporarily decreased the Lasix to 40 mg once a day.   Review of Systems   In addition to the HPI above,  No Fever-chills, myalgias or other constitutional symptoms No Headache, changes with Vision or hearing, new weakness, tingling No problems swallowing food or Liquids, indigestion/reflux No Chest pain, Cough or Shortness of Breath, palpitations, orthopnea  No Abdominal pain, N/V; no melena or hematochezia, no dark tarry stools, Bowel movements are regular, No dysuria, hematuria or flank pain No new skin rashes, lesions, masses or bruises, No new joints pains-aches No recent weight gain or loss No polyuria, polydypsia or polyphagia,  *A full 10 point Review of Systems was done, except as stated above, all other Review of Systems were negative.  Social History Social History  Substance Use Topics  . Smoking status: Former Smoker -- 10 years    Types: Cigarettes    Quit date: 04/11/1984  . Smokeless tobacco: Not on file  . Alcohol Use: 0.0 oz/week    0  Standard drinks or equivalent per week     Comment: cocktail daily    Resides at: Private residence  Lives with: Alone  Ambulatory status: Without assistive devices prior to this admission   Family History Family History  Problem Relation Age of Onset  . COPD Father   . Lung cancer Mother   . Breast cancer Maternal Aunt      Prior to Admission medications   Medication Sig Start Date End Date Taking? Authorizing Provider  amLODipine (NORVASC) 2.5 MG tablet Take 1 tablet (2.5 mg total) by mouth daily. 02/19/15  Yes Dorothy Spark, MD  aspirin 81 MG tablet Take 81 mg  by mouth daily.   Yes Historical Provider, MD  busPIRone (BUSPAR) 7.5 MG tablet Take 7.5 mg by mouth 3 (three) times daily.   Yes Historical Provider, MD  cholecalciferol (VITAMIN D) 1000 UNITS tablet Take 1,000 Units by mouth daily.   Yes Historical Provider, MD  desmopressin (DDAVP) 0.2 MG tablet Take 0.2 mg by mouth daily.   Yes Historical Provider, MD  furosemide (LASIX) 40 MG tablet Take 2 tablets by mouth at 8 am (80 mg), and then take 1 tablet by mouth at 2 pm (40 mg) every day Patient taking differently: Take 40-80 mg by mouth 2 (two) times daily. Take 2 tablets by mouth at 8 am (80 mg), and then take 1 tablet by mouth at 2 pm (40 mg) every day 02/19/15  Yes Dorothy Spark, MD  HYDROcodone-acetaminophen (NORCO) 7.5-325 MG per tablet Take 1 tablet by mouth every 6 (six) hours as needed for moderate pain.   Yes Historical Provider, MD  levothyroxine (SYNTHROID, LEVOTHROID) 100 MCG tablet Take 100 mcg by mouth daily before breakfast.   Yes Historical Provider, MD  losartan (COZAAR) 100 MG tablet Take 100 mg by mouth daily.   Yes Historical Provider, MD  meloxicam (MOBIC) 15 MG tablet Take 15 mg by mouth daily.   Yes Historical Provider, MD  metoprolol (LOPRESSOR) 50 MG tablet Take 50 mg by mouth 2 (two) times daily.   Yes Historical Provider, MD  Multiple Vitamin (MULTI-DAY VITAMINS PO) Take by mouth.   Yes  Historical Provider, MD  nortriptyline (PAMELOR) 25 MG capsule Take 25 mg by mouth at bedtime.   Yes Historical Provider, MD  omeprazole (PRILOSEC) 20 MG capsule Take 20 mg by mouth 2 (two) times daily before a meal.    Yes Historical Provider, MD  polyethylene glycol (MIRALAX / GLYCOLAX) packet Take 17 g by mouth daily.   Yes Historical Provider, MD  potassium chloride SA (K-DUR,KLOR-CON) 20 MEQ tablet Take 1 tablet (20 mEq total) by mouth daily. 02/19/15  Yes Dorothy Spark, MD  levothyroxine (SYNTHROID, LEVOTHROID) 75 MCG tablet Take 75 mcg by mouth daily before breakfast.    Historical Provider, MD    No Known Allergies  Physical Exam  Vitals  Blood pressure 145/67, pulse 73, temperature 98.8 F (37.1 C), temperature source Oral, resp. rate 13, SpO2 98 %.   General:  In no acute distress, appears healthy and well nourished  Psych:  Normal affect, Denies Suicidal or Homicidal ideations, Awake Alert, Oriented X 3. Speech and thought patterns are clear and appropriate, no apparent short term memory deficits  Neuro:   No focal neurological deficits, CN II through XII intact, Strength 5/5 all 4 extremities except for limited strength and mobility of left leg secondary to pain from acute fracture as well as placement of knee immobilizer in the emergency department, Sensation intact all 4 extremities.  ENT:  Ears and Eyes appear Normal, Conjunctivae clear, PER. Moist oral mucosa without erythema or exudates.  Neck:  Supple, No lymphadenopathy appreciated  Respiratory:  Symmetrical chest wall movement, Good air movement bilaterally, CTAB. Room Air  Cardiac:  RRR, No Murmurs, no LE edema noted, no JVD, No carotid bruits, peripheral pulses palpable at 2+  Abdomen:  Positive bowel sounds, Soft, Non tender, Non distended,  No masses appreciated, no obvious hepatosplenomegaly  Skin:  No Cyanosis, Normal Skin Turgor, No Skin Rash or Bruise.  Extremities: Symmetrical, left leg and long  the immobilizer applied in the ER no effusions.  Data Review  CBC  Recent Labs Lab 03/06/15 1254  WBC 8.9  HGB 13.2  HCT 41.6  PLT 351  MCV 94.8  MCH 30.1  MCHC 31.7  RDW 13.1  LYMPHSABS 1.8  MONOABS 0.6  EOSABS 0.3  BASOSABS 0.0    Chemistries   Recent Labs Lab 03/06/15 1254  NA 137  K 4.6  CL 100*  CO2 27  GLUCOSE 93  BUN 18  CREATININE 0.91  CALCIUM 9.1  AST 19  ALT 12*  ALKPHOS 97  BILITOT 0.6    CrCl cannot be calculated (Unknown ideal weight.).  No results for input(s): TSH, T4TOTAL, T3FREE, THYROIDAB in the last 72 hours.  Invalid input(s): FREET3  Coagulation profile  Recent Labs Lab 03/06/15 1254  INR 1.19    No results for input(s): DDIMER in the last 72 hours.  Cardiac Enzymes No results for input(s): CKMB, TROPONINI, MYOGLOBIN in the last 168 hours.  Invalid input(s): CK  Invalid input(s): POCBNP  Urinalysis    Component Value Date/Time   COLORURINE YELLOW 01/06/2009 0808   APPEARANCEUR CLOUDY* 01/06/2009 0808   LABSPEC 1.007 01/06/2009 0808   PHURINE 7.0 01/06/2009 0808   GLUCOSEU NEGATIVE 01/06/2009 0808   HGBUR NEGATIVE 01/06/2009 0808   BILIRUBINUR NEGATIVE 01/06/2009 0808   KETONESUR NEGATIVE 01/06/2009 0808   PROTEINUR NEGATIVE 01/06/2009 0808   UROBILINOGEN 0.2 01/06/2009 0808   NITRITE NEGATIVE 01/06/2009 0808   LEUKOCYTESUR  01/06/2009 0808    NEGATIVE MICROSCOPIC NOT DONE ON URINES WITH NEGATIVE PROTEIN, BLOOD, LEUKOCYTES, NITRITE, OR GLUCOSE <1000 mg/dL.    Imaging results:   Dg Chest 2 View  03/06/2015  CLINICAL DATA:  Fall. Recent shoulder fracture. Anterior lower chest wall pain. EXAM: CHEST  2 VIEW COMPARISON:  08/09/2010 chest radiograph. FINDINGS: Stable cardiomediastinal silhouette with top-normal heart size. No pneumothorax. No pleural effusion. No pulmonary edema. Mild curvilinear opacities are present at both lung bases. No displaced fracture in the visualized chest. Severe osteoarthritis in the  bilateral glenohumeral joints. IMPRESSION: Mild curvilinear opacities at both lung bases, favor scarring or atelectasis. Electronically Signed   By: Ilona Sorrel M.D.   On: 03/06/2015 12:09   Dg Wrist Complete Left  03/06/2015  CLINICAL DATA:  Tripped and fell today. Initial encounter. The patient fell to her knees. Burning and pain in the left wrist. Personal history of left wrist fracture 2 years ago. EXAM: LEFT WRIST - COMPLETE 3+ VIEW COMPARISON:  Left wrist radiographs 02/22/2013 FINDINGS: Moderate osteopenia is present. Previous fracture has healed. No definite acute fracture is evident. Degenerative changes extend to the DRUJ. IMPRESSION: 1. No acute abnormality. 2. Remote fractures have healed. 3. Moderate osteopenia. Electronically Signed   By: San Morelle M.D.   On: 03/06/2015 12:10   Dg Shoulder Left  03/06/2015  CLINICAL DATA:  Initial encounter for Pt tripped and fell this morning, she went down and hit both of her knees, then fell and landed on her left side. Known Left shoulder Fx from 2 weeks ago, and she has a previous left wrist Fx from 2014. She has bilateral knee p.*comment was truncated* EXAM: LEFT SHOULDER - 2+ VIEW COMPARISON:  None. FINDINGS: Osteopenia. Visualized portion of the left hemithorax is normal. Advanced glenohumeral joint osteoarthritis. Undersurface acromioclavicular joint degenerative change. High-riding humeral head consistent with chronic rotator cuff insufficiency. No acute fracture or dislocation. IMPRESSION: Advanced osteoarthritis.  No acute findings. Electronically Signed   By: Abigail Miyamoto M.D.   On: 03/06/2015 12:09   Dg Knee Complete 4 Views  Left  03/06/2015  CLINICAL DATA:  Knee pain secondary to a fall this morning. EXAM: LEFT KNEE - COMPLETE 4+ VIEW COMPARISON:  None. FINDINGS: There is a nondisplaced fracture of the mid patella. There is a small joint effusion. The patient has had a hemiarthroplasty in the medial compartment. There are slight  degenerative changes in the patellofemoral compartment and in the lateral compartment with chondromalacia in the lateral compartment. There is slight irregularity of the lateral tibial plateau beneath the prosthesis. IMPRESSION: Acute nondisplaced fracture of the mid patella. Electronically Signed   By: Lorriane Shire M.D.   On: 03/06/2015 12:09   Dg Knee Complete 4 Views Right  03/06/2015  CLINICAL DATA:  Initial encounter for Pt tripped and fell this morning, she went down and hit both of her knees, then fell and landed on her left side. Known Left shoulder Fx from 2 weeks ago, and she has a previous left wrist Fx from 2014. She has bilateral knee p.*comment was truncated* EXAM: RIGHT KNEE - COMPLETE 4+ VIEW COMPARISON:  None. FINDINGS: Knee arthroplasty. No acute hardware complication. No joint effusion. No acute fracture or dislocation. IMPRESSION: Expected appearance after knee arthroplasty. Electronically Signed   By: Abigail Miyamoto M.D.   On: 03/06/2015 12:07   Dg Hips Bilat With Pelvis 3-4 Views  03/06/2015  CLINICAL DATA:  Fall.  Initial evaluation. EXAM: DG HIP (WITH OR WITHOUT PELVIS) 3-4V BILAT COMPARISON:  No prior exam for comparison. FINDINGS: Diffuse osteopenia degenerative change. No acute abnormality. No evidence of fracture dislocation. IMPRESSION: Diffuse osteopenia degenerative change.  No acute abnormality. Electronically Signed   By: Marcello Moores  Register   On: 03/06/2015 12:07     EKG: (Independently reviewed) sinus rhythm with ventricular response 67 bpm, QTC 444 ms, no ST segment or T-wave changes that would be concerning for ischemia and no ectopy.   Assessment & Plan  Principal Problem:   Left Patellar fracture -MedSurg -PT/OT evaluation -Social work consultation for possible skilled nursing facility placement for rehabilitation -Patient has a component of chronic pain so we'll continue preadmission meloxicam since renal function is normal but have opted to discontinue  Vicodin and favor of separating the Tylenol and the oxycodone; I've also ordered IV morphine for breakthrough pain -EDP has placed a call to orthopedic physician to make aware of current new injury and need for admission  Active Problems:   Frequent falls -History consistent with mechanical falls -PT/OT evaluation as above -Recent left shoulder injury with arm now in sling and followed outpatient by orthopedic physician    Hypertension -Recent issues with poor control -Patient reports that because of edema and Norvasc had been discontinued but recently restarted -Also on Cozaar and Lopressor -Patient with what sounds like TIA type symptoms transiently this summer consisting of left-sided numbness without motor deficits or facial drooping as well as at least one episode of difficulty word finding; based on history given this appears to be associated with poorly controlled blood pressure -For reassurance for patient and family will obtain noncontrasted CT of the head -No voltage criteria for LVH on EKG but given long-standing hypertension and need for multiple medications patient certain he could have underlying diastolic dysfunction    Bilateral lower extremity edema/DOE -Ongoing issue for several months -Evaluated by cardiology as an outpatient; started on Lasix with good urinary response according to patient -Because of recent falls with injury was unable to pursue outpatient echocardiogram so we'll obtain this admission -Continue preadmission Lasix at dose patient reported which  is 40 mg per day -Chronic use of Mobic could contribute to edema; discussed with patient that drug of choice for chronic osteoarthritis pain is Tylenol    OSA (obstructive sleep apnea) -Continue nocturnal CPAP -Patient could have underlying right-sided heart failure    Hypothyroid -Continue Synthroid -Patient reports postsurgical hypothyroidism    OAB/Stress incontinence -Continue DDAVP       DVT  Prophylaxis: Lovenox  Family Communication: Daughters at bedside     Code Status:  Full code  Condition:  Stable  Discharge disposition: Anticipate discharge next 72 hours potentially to skilled nursing facility if meets criteria; patient made aware by case manager that may need to pay up to 20% of fees incurred for short-term rehabilitative therapy  Time spent in minutes : 60      Pavle Wiler L. ANP on 03/06/2015 at 4:19 PM  Between 7am to 7pm - Pager - [phone]  After 7pm go to [email or web address] - password [email or web address]  And look for the night coverage person covering me after hours  [facility]

## 2015-03-06 NOTE — Progress Notes (Signed)
  Echocardiogram 2D Echocardiogram has been performed.  Diamond Nickel 03/06/2015, 4:31 PM

## 2015-03-06 NOTE — ED Notes (Signed)
Report called, echocardiogram being done at the bedside at this time.

## 2015-03-06 NOTE — ED Provider Notes (Signed)
CSN: [id number]     Arrival date & time 03/06/15  1024 History   First MD Initiated Contact with Patient 03/06/15 1025     Chief Complaint  Patient presents with  . Shoulder Pain     (Consider location/radiation/quality/duration/timing/severity/associated sxs/prior Treatment) HPI   The patient has a past medical history of rheumatoid arthritis, diverticulosis, hyperthyroidism, overactive bladder, chronic low back pain, depression, cardiomegaly. She recently broke her shoulder, one week ago after a fall and is being seen by Dr. Onnie Graham with [facility] orthopedics. She comes to the emergency department because yesterday morning she was distracted and stubbed her toe falling onto her I lateral knees and then chest. She has not been able to walk since the incident happened. She complains of pain to bilateral knees, left wrist, left shoulder and sternum.  She was at her daughter's house yesterday and did not want to be seen because it was Thanksgiving. She took her home medicines and ice her extremities. This morning when her pain was worse she called her primary care doctor at [facility] and they advised that she come to the emergency department, therefore EMS was called and she is transferred to the ER. She denies any loss of consciousness, hitting her head or injuring her neck. She denies pain in her hips.    Past Medical History  Diagnosis Date  . Rheumatoid arthritis(714.0)   . Unspecified essential hypertension   . Esophageal reflux   . Hypothyroidism   . Diverticulosis   . Insomnia   . OSA (obstructive sleep apnea)   . OAB (overactive bladder)   . LBP (low back pain)   . DOE (dyspnea on exertion)   . Shoulder pain, bilateral   . GERD (gastroesophageal reflux disease)   . OAB (overactive bladder)   . Depression   . Venous insufficiency   . Cardiomegaly   . Osteoarthritis    Past Surgical History  Procedure Laterality Date  . Appendectomy  1953  . Vesicovaginal fistula  closure w/ tah    . Breast biopsy Right 1980  . Knee arthroscopy w/ autogenous cartilage implantation (aci) procedure Left 1994, 1995  . Knee arthroscopy Right 1996, 2010  . Shoulder surgery  1990  . Cystocele repair    . Cesarean section      1957, 1961, 1964  . Wrist surgery  1967  . Total abdominal hysterectomy  1972  . Bladder abduction-1996  1996  . Cosmetic surgery  1996   Family History  Problem Relation Age of Onset  . COPD Father   . Lung cancer Mother   . Breast cancer Maternal Aunt    Social History  Substance Use Topics  . Smoking status: Former Smoker -- 10 years    Types: Cigarettes    Quit date: 04/11/1984  . Smokeless tobacco: None  . Alcohol Use: 0.0 oz/week    0 Standard drinks or equivalent per week     Comment: cocktail daily   OB History    No data available     Review of Systems ROS: No TIA's or unusual headaches, no dysphagia.  No prolonged cough. No dyspnea or chest pain on exertion.  No abdominal pain, change in bowel habits, black or bloody stools.  No urinary tract symptoms..  Normal menses, no abnormal vaginal bleeding, discharge or unexpected pelvic pain. No new breast lumps, breast pain or nipple discharge.    Allergies  Review of patient's allergies indicates no known allergies.  Home Medications   Prior to  Admission medications   Medication Sig Start Date End Date Taking? Authorizing Provider  amLODipine (NORVASC) 2.5 MG tablet Take 1 tablet (2.5 mg total) by mouth daily. 02/19/15  Yes Dorothy Spark, MD  aspirin 81 MG tablet Take 81 mg by mouth daily.   Yes Historical Provider, MD  busPIRone (BUSPAR) 7.5 MG tablet Take 7.5 mg by mouth 3 (three) times daily.   Yes Historical Provider, MD  cholecalciferol (VITAMIN D) 1000 UNITS tablet Take 1,000 Units by mouth daily.   Yes Historical Provider, MD  desmopressin (DDAVP) 0.2 MG tablet Take 0.2 mg by mouth daily.   Yes Historical Provider, MD  furosemide (LASIX) 40 MG tablet Take 2  tablets by mouth at 8 am (80 mg), and then take 1 tablet by mouth at 2 pm (40 mg) every day Patient taking differently: Take 40-80 mg by mouth 2 (two) times daily. Take 2 tablets by mouth at 8 am (80 mg), and then take 1 tablet by mouth at 2 pm (40 mg) every day 02/19/15  Yes Dorothy Spark, MD  HYDROcodone-acetaminophen (NORCO) 7.5-325 MG per tablet Take 1 tablet by mouth every 6 (six) hours as needed for moderate pain.   Yes Historical Provider, MD  levothyroxine (SYNTHROID, LEVOTHROID) 100 MCG tablet Take 100 mcg by mouth daily before breakfast.   Yes Historical Provider, MD  losartan (COZAAR) 100 MG tablet Take 100 mg by mouth daily.   Yes Historical Provider, MD  meloxicam (MOBIC) 15 MG tablet Take 15 mg by mouth daily.   Yes Historical Provider, MD  metoprolol (LOPRESSOR) 50 MG tablet Take 50 mg by mouth 2 (two) times daily.   Yes Historical Provider, MD  Multiple Vitamin (MULTI-DAY VITAMINS PO) Take by mouth.   Yes Historical Provider, MD  nortriptyline (PAMELOR) 25 MG capsule Take 25 mg by mouth at bedtime.   Yes Historical Provider, MD  omeprazole (PRILOSEC) 20 MG capsule Take 20 mg by mouth 2 (two) times daily before a meal.    Yes Historical Provider, MD  polyethylene glycol (MIRALAX / GLYCOLAX) packet Take 17 g by mouth daily.   Yes Historical Provider, MD  potassium chloride SA (K-DUR,KLOR-CON) 20 MEQ tablet Take 1 tablet (20 mEq total) by mouth daily. 02/19/15  Yes Dorothy Spark, MD  levothyroxine (SYNTHROID, LEVOTHROID) 75 MCG tablet Take 75 mcg by mouth daily before breakfast.    Historical Provider, MD   BP 155/66 mmHg  Pulse 69  Temp(Src) 98.8 F (37.1 C) (Oral)  Resp 13  SpO2 94% Physical Exam  Constitutional: She appears well-developed and well-nourished. No distress.  HENT:  Head: Normocephalic and atraumatic. Head is without Battle's sign, without abrasion, without contusion, without laceration, without right periorbital erythema and without left periorbital  erythema.  Right Ear: Tympanic membrane and ear canal normal.  Left Ear: Tympanic membrane and ear canal normal.  Nose: Nose normal.  Mouth/Throat: Uvula is midline and oropharynx is clear and moist.  Eyes: Pupils are equal, round, and reactive to light.  Neck: Normal range of motion. Neck supple.  Cardiovascular: Normal rate and regular rhythm.   Pulmonary/Chest: Effort normal and breath sounds normal. She exhibits tenderness and bony tenderness. She exhibits no laceration, no crepitus, no edema, no deformity and no swelling.    Abdominal: Soft. Bowel sounds are normal. There is no tenderness. There is no guarding.  Musculoskeletal:  Contusions, swelling, ecchymosis and erythema to bilateral knees, decreased ROM due to pain.  Bilateral hips have no pain to palpation. She  is unable to range them due to pain in the knees. Ecchymosis noted to bilateral proximal anterior thighs.  Pain to palpation of left shoulder (current known fracture). Also pain to palpation of left wrist. Physiologic bilateral grip strengths.  Neurological: She is alert.  Skin: Skin is warm and dry.  Nursing note and vitals reviewed.   ED Course  Procedures (including critical care time) Labs Review Labs Reviewed  PROTIME-INR - Abnormal; Notable for the following:    Prothrombin Time 15.3 (*)    All other components within normal limits  CBC WITH DIFFERENTIAL/PLATELET  COMPREHENSIVE METABOLIC PANEL    Imaging Review Dg Chest 2 View  03/06/2015  CLINICAL DATA:  Fall. Recent shoulder fracture. Anterior lower chest wall pain. EXAM: CHEST  2 VIEW COMPARISON:  08/09/2010 chest radiograph. FINDINGS: Stable cardiomediastinal silhouette with top-normal heart size. No pneumothorax. No pleural effusion. No pulmonary edema. Mild curvilinear opacities are present at both lung bases. No displaced fracture in the visualized chest. Severe osteoarthritis in the bilateral glenohumeral joints. IMPRESSION: Mild curvilinear  opacities at both lung bases, favor scarring or atelectasis. Electronically Signed   By: Ilona Sorrel M.D.   On: 03/06/2015 12:09   Dg Wrist Complete Left  03/06/2015  CLINICAL DATA:  Tripped and fell today. Initial encounter. The patient fell to her knees. Burning and pain in the left wrist. Personal history of left wrist fracture 2 years ago. EXAM: LEFT WRIST - COMPLETE 3+ VIEW COMPARISON:  Left wrist radiographs 02/22/2013 FINDINGS: Moderate osteopenia is present. Previous fracture has healed. No definite acute fracture is evident. Degenerative changes extend to the DRUJ. IMPRESSION: 1. No acute abnormality. 2. Remote fractures have healed. 3. Moderate osteopenia. Electronically Signed   By: San Morelle M.D.   On: 03/06/2015 12:10   Dg Shoulder Left  03/06/2015  CLINICAL DATA:  Initial encounter for Pt tripped and fell this morning, she went down and hit both of her knees, then fell and landed on her left side. Known Left shoulder Fx from 2 weeks ago, and she has a previous left wrist Fx from 2014. She has bilateral knee p.*comment was truncated* EXAM: LEFT SHOULDER - 2+ VIEW COMPARISON:  None. FINDINGS: Osteopenia. Visualized portion of the left hemithorax is normal. Advanced glenohumeral joint osteoarthritis. Undersurface acromioclavicular joint degenerative change. High-riding humeral head consistent with chronic rotator cuff insufficiency. No acute fracture or dislocation. IMPRESSION: Advanced osteoarthritis.  No acute findings. Electronically Signed   By: Abigail Miyamoto M.D.   On: 03/06/2015 12:09   Dg Knee Complete 4 Views Left  03/06/2015  CLINICAL DATA:  Knee pain secondary to a fall this morning. EXAM: LEFT KNEE - COMPLETE 4+ VIEW COMPARISON:  None. FINDINGS: There is a nondisplaced fracture of the mid patella. There is a small joint effusion. The patient has had a hemiarthroplasty in the medial compartment. There are slight degenerative changes in the patellofemoral compartment and in  the lateral compartment with chondromalacia in the lateral compartment. There is slight irregularity of the lateral tibial plateau beneath the prosthesis. IMPRESSION: Acute nondisplaced fracture of the mid patella. Electronically Signed   By: Lorriane Shire M.D.   On: 03/06/2015 12:09   Dg Knee Complete 4 Views Right  03/06/2015  CLINICAL DATA:  Initial encounter for Pt tripped and fell this morning, she went down and hit both of her knees, then fell and landed on her left side. Known Left shoulder Fx from 2 weeks ago, and she has a previous left wrist Fx from 2014.  She has bilateral knee p.*comment was truncated* EXAM: RIGHT KNEE - COMPLETE 4+ VIEW COMPARISON:  None. FINDINGS: Knee arthroplasty. No acute hardware complication. No joint effusion. No acute fracture or dislocation. IMPRESSION: Expected appearance after knee arthroplasty. Electronically Signed   By: Abigail Miyamoto M.D.   On: 03/06/2015 12:07   Dg Hips Bilat With Pelvis 3-4 Views  03/06/2015  CLINICAL DATA:  Fall.  Initial evaluation. EXAM: DG HIP (WITH OR WITHOUT PELVIS) 3-4V BILAT COMPARISON:  No prior exam for comparison. FINDINGS: Diffuse osteopenia degenerative change. No acute abnormality. No evidence of fracture dislocation. IMPRESSION: Diffuse osteopenia degenerative change.  No acute abnormality. Electronically Signed   By: Marcello Moores  Register   On: 03/06/2015 12:07   I have personally reviewed and evaluated these images and lab results as part of my medical decision-making.   EKG Interpretation   Date/Time:  Friday March 06 2015 10:28:01 EST Ventricular Rate:  67 PR Interval:  203 QRS Duration: 85 QT Interval:  421 QTC Calculation: 444 R Axis:   15 Text Interpretation:  Sinus rhythm Nonspecific T abnormalities, lateral  leads No significant change since last tracing Confirmed by KNAPP  MD-J,  JON [id number]) on 03/06/2015 10:36:07 AM      MDM   Final diagnoses:  Fall, initial encounter  Patellar fracture, left,  closed, initial encounter   Ortho consulted x 3 regarding patellar fracture, no return page x 3. Knee immobilizer ordered.   Pt reports being unable to walk on either leg due to pain. She also has left shoulder pain. She lives independently and would prefer to be placed in a rehab facility but her insurance does not cover it and she reports not having enough money to pay for one. Will consider home health. Case Manager and recommended asking Physical Therapy to do an assessment to determine home health needs. Pt to be discharged pending assessment. Case Manager also going to speak with family regarding options for home health vs admission.-- otherwise she is medically cleared.  Filed Vitals:   03/06/15 1030 03/06/15 1230  BP: 161/65 155/66  Pulse: 65 69  Temp:    Resp: 13     .    Delos Haring, PA-C 03/06/15 1350  Dorie Rank, MD 03/06/15 [phone]

## 2015-03-06 NOTE — ED Notes (Addendum)
Per [facility] daughter called 911 after the patient fell yesterday morning.  The patient fell one week ago and broke her left shoulder.  Patient denies LOC, says the pain is primarily in the broken shoulder.  Patient in no apparent distress at this time.

## 2015-03-06 NOTE — Care Management Note (Signed)
Case Management Note  Patient Details  Name: Lamaya Hyneman MRN: [id number] Date of Birth: 27-Feb-1933  Subjective/Objective:       Patient presented to [facility] ED after s/p mechanical fall with fractured patella. Patient reports recurrent falls, patient fractured her left shoulder last week after a fall.  Patient lives alone,and is unable to weight bear, has bedside commode and walker at home, unable to perform ADL's.   PT consulted for eval in the ED,  PT  Recommendation is SNF for Rehab. CM met with patient and Daughter Lujean Rave [phone], CM explained that patient does not meet criteria for inpatient stay that would be covered by her Medicare insurance, she would be self-pay for a SNF. patient and daughter are fully aware teach back done both verbalized understanding. EDP made aware, Consult placed for Hospitalist for Obs placement. CM /CSW will continue to follow for disposition.   Action/Plan:  Expected Discharge Date:                  Expected Discharge Plan:    Self Pay SNF vs. HH services  In-House Referral:  Clinical Social Work  Discharge planning Services  CM Consult  Post Acute Care Choice:    Choice offered to:     DME Arranged:    DME Agency:     HH Arranged:    HH Agency:     Status of Service:  In process, will continue to follow  Medicare Important Message Given:    Date Medicare IM Given:    Medicare IM give by:    Date Additional Medicare IM Given:    Additional Medicare Important Message give by:     If discussed at [place] of Stay Meetings, dates discussed:    Additional CommentsLaurena Slimmer, RN 03/06/2015, 3:49 PM

## 2015-03-06 NOTE — Progress Notes (Signed)
Pt stated she would like to be set up on CPAP at 2300.

## 2015-03-06 NOTE — Progress Notes (Signed)
Orthopedic Tech Progress Note Patient Details:  Lisa Mcclure 1932/10/30 [id number]  Ortho Devices Type of Ortho Device: Knee Immobilizer Ortho Device/Splint Interventions: Application   [name] 03/06/2015, 6:16 PM

## 2015-03-06 NOTE — ED Notes (Signed)
Patient transported to X-ray 

## 2015-03-07 ENCOUNTER — Inpatient Hospital Stay (HOSPITAL_COMMUNITY): Payer: Medicare Other

## 2015-03-07 DIAGNOSIS — I10 Essential (primary) hypertension: Secondary | ICD-10-CM

## 2015-03-07 LAB — CBC
HCT: 34.5 % — ABNORMAL LOW (ref 36.0–46.0)
Hemoglobin: 11 g/dL — ABNORMAL LOW (ref 12.0–15.0)
MCH: 30.5 pg (ref 26.0–34.0)
MCHC: 31.9 g/dL (ref 30.0–36.0)
MCV: 95.6 fL (ref 78.0–100.0)
Platelets: 308 10*3/uL (ref 150–400)
RBC: 3.61 MIL/uL — ABNORMAL LOW (ref 3.87–5.11)
RDW: 13.3 % (ref 11.5–15.5)
WBC: 7.7 10*3/uL (ref 4.0–10.5)

## 2015-03-07 LAB — BASIC METABOLIC PANEL
Anion gap: 5 (ref 5–15)
BUN: 16 mg/dL (ref 6–20)
CO2: 29 mmol/L (ref 22–32)
Calcium: 8.4 mg/dL — ABNORMAL LOW (ref 8.9–10.3)
Chloride: 103 mmol/L (ref 101–111)
Creatinine, Ser: 0.9 mg/dL (ref 0.44–1.00)
GFR calc Af Amer: >60 mL/min (ref >60)
GFR calc non Af Amer: 59 mL/min — ABNORMAL LOW (ref >60)
Glucose, Bld: 121 mg/dL — ABNORMAL HIGH (ref 65–99)
Potassium: 4 mmol/L (ref 3.5–5.1)
Sodium: 137 mmol/L (ref 135–145)

## 2015-03-07 LAB — URINALYSIS, ROUTINE W REFLEX MICROSCOPIC
Bilirubin Urine: NEGATIVE
Glucose, UA: NEGATIVE mg/dL
Hgb urine dipstick: NEGATIVE
Ketones, ur: NEGATIVE mg/dL
Leukocytes, UA: NEGATIVE
Nitrite: NEGATIVE
Protein, ur: NEGATIVE mg/dL
Specific Gravity, Urine: 1.022 (ref 1.005–1.030)
pH: 5.5 (ref 5.0–8.0)

## 2015-03-07 MED ORDER — LORAZEPAM 2 MG/ML IJ SOLN: 1.0000 mg | Freq: Once | INTRAMUSCULAR | Status: DC | PRN

## 2015-03-07 MED ORDER — MAGNESIUM CITRATE PO SOLN
0.5000 | Freq: Once | ORAL | Status: AC
  Administered 2015-03-07: 0.5 via ORAL
  Filled 2015-03-07: qty 296

## 2015-03-07 MED ORDER — FLEET ENEMA 7-19 GM/118ML RE ENEM: 1.0000 | ENEMA | Freq: Once | RECTAL | Status: DC

## 2015-03-07 NOTE — Progress Notes (Signed)
Patient refused CPAP for tonight. Stated she tried it last night but could not tolerate the mask. Machine still in room and RT made patient aware that if shed like to try later to call.

## 2015-03-07 NOTE — Progress Notes (Signed)
Orthopedic Tech Progress Note Patient Details:  Lisa Mcclure 1932-12-10 [id number]  Ortho Devices Type of Ortho Device: Velcro wrist splint Ortho Device/Splint Interventions: Veleta Miners 03/07/2015, 11:28 AM

## 2015-03-07 NOTE — Progress Notes (Signed)
Physical Therapy Treatment Patient Details Name: Lisa Mcclure MRN: [id number] DOB: 1932/12/05 Today's Date: 03/07/2015    History of Present Illness pt presents with multiple falls sustaining L "shoulder" fx 1 week ago and fell on Thanksgiving Day resulting in L Patella fx.  pt with hx of falls, bil wrist fxs, L rib fxs, R TKA, RA, THN, Depression, and OSA.      PT Comments    Pt was very willing to ambulate and perform exercises. She presents with impaired safety during transfers, decreased balance and endurance exhibited during ambulation. Pt and dgtr educated on HEP, use of DME, and mobility. They were informed to avoid ambulation until next PT tx due to safety concerns but could move from bed to chair/BSC with staff, they verbally agreed.   Follow Up Recommendations  SNF     Equipment Recommendations       Recommendations for Other Services       Precautions / Restrictions Precautions Precautions: Fall Required Braces or Orthoses: Knee Immobilizer - Left;Sling (L arm sling) Knee Immobilizer - Left: On at all times Restrictions Weight Bearing Restrictions: Yes RUE Weight Bearing: Weight bearing as tolerated LUE Weight Bearing: Non weight bearing RLE Weight Bearing: Weight bearing as tolerated LLE Weight Bearing: Weight bearing as tolerated Other Position/Activity Restrictions: No orders for L UE, but maintained NWBing and in sling.     Mobility  Bed Mobility               General bed mobility comments: Pt EOB upon arrival  Transfers Overall transfer level: Needs assistance Equipment used: 1 person hand held assist Transfers: Sit to/from [facility] Sit to Stand: Min assist Stand pivot transfers: Min assist       General transfer comment: VC for hand placement and sequencing. Min assist for powering up in all trials. Stand Pivot from [facility] x1, sit to stand from [facility] to ambulation x1, sit to stand from chair x1.    Ambulation/Gait Ambulation/Gait assistance: Min guard Ambulation Distance (Feet): 75 Feet Assistive device: Hemi-walker Gait Pattern/deviations: Step-through pattern;Decreased stance time - left;Decreased stride length   Gait velocity interpretation: Below normal speed for age/gender General Gait Details: Required VC to avoid moving hemiwalker out too far. Min guard for steadiness   Stairs            Wheelchair Mobility    Modified Rankin (Stroke Patients Only)       Balance Overall balance assessment: History of Falls;Needs assistance Sitting-balance support: Feet supported Sitting balance-Leahy Scale: Good     Standing balance support: Single extremity supported Standing balance-Leahy Scale: Poor Standing balance comment: Stood without need of hemiwalker but did not formally test dynamically                    Cognition Arousal/Alertness: Awake/alert Behavior During Therapy: WFL for tasks assessed/performed Overall Cognitive Status: Within Functional Limits for tasks assessed                      Exercises General Exercises - Lower Extremity Ankle Circles/Pumps: AROM;Both;Other reps (comment);Seated (30 reps) Hip ABduction/ADduction: AROM;Both;15 reps;Seated Straight Leg Raises: AROM;Both;Seated;10 reps    General Comments        Pertinent Vitals/Pain Pain Assessment: 0-10 Pain Score: 2  Pain Location: L Knee Pain Descriptors / Indicators: Aching Pain Intervention(s): Monitored during session;Repositioned;RN gave pain meds during session    Home Living  Prior Function            PT Goals (current goals can now be found in the care plan section) Progress towards PT goals: Progressing toward goals    Frequency  Min 3X/week    PT Plan      Co-evaluation             End of Session Equipment Utilized During Treatment: Gait belt;Left knee immobilizer Activity Tolerance: Patient limited by  fatigue Patient left: in chair;with call bell/phone within reach;with family/visitor present;with nursing/sitter in room     Time: [id number] PT Time Calculation (min) (ACUTE ONLY): 28 min  Charges:  $Gait Training: 8-22 mins $Therapeutic Exercise: 8-22 mins                    G CodesHaynes Bast 02-Apr-2015, 2:18 PM Haynes Bast, SPT April 02, 2015 2:23 PM

## 2015-03-07 NOTE — Progress Notes (Signed)
PATIENT DETAILS Name: Lisa Mcclure Age: 82 y.o. Sex: female Date of Birth: [birth year] Admit Date: 03/06/2015 Admitting Physician Elmarie Shiley, MD [id number], MD  Subjective: Complains of pain in the left knee area.  Assessment/Plan: Principal Problem: Left Patellar fracture: Secondary to a mechanical fall-Ortho consulted-recommendations are for knee immobilizer. Continue supportive care. PT eval completed-SNF recommended-Will consult social work  Active Problems: Recent left shoulder fracture: This was approximately one week prior to this admission-seen by Dr.Supple as an outpatient. Continue with arm sling-seen by orthopedics-CT scan of the left shoulder ordered. Follow.  Lower extremity edema: Mild edema present. Recently seen by cardiology-felt to have acute diastolic heart failure-and started on Lasix-unfortunately has history of urinary incontinence/nocturnal enuresis-and feels very difficult to take Lasix with all these issues. For now would continue with Lasix, check daily weights and see if we can use Lasix as needed if weight is increasing beyond her dry weight. Check UA to make sure no proteinuria. Discontinue amlodipine  Hypertension: Continue losartan, metoprolol and Lasix. Given lower extremity edema-will discontinue amlodipine. Follow-and chest accordingly  History of frequent falls: None of them have been secondary to syncopal episode. Apparently patient has had numerous falls over the past few years. Have asked patient to follow with PCP and minimize benzodiazepine/narcotics/sedatives, have also advised patient to remove loose carpeting-ensure adequate lighting around the house. Patient also instructed to follow with ophthalmologist as well. We will check orthostatics. Note- CT head/MRI brain negative for any acute abnormalities.  Hypothyroidism: Continue with levothyroxine  OSA (obstructive sleep apnea): Likely related to  obesity-continue CPAP daily at bedtime  Overactive bladder/nocturnal enuresis: Continue with desmopressin.  Disposition: Remain inpatient-?SNF on discharge  Antimicrobial agents  See below  Anti-infectives    None      DVT Prophylaxis: Prophylactic Lovenox   Code Status: Full code  Family Communication Daughter at bedside  Procedures: None  CONSULTS:  orthopedic surgery  Time spent 30 minutes-Greater than 50% of this time was spent in counseling, explanation of diagnosis, planning of further management, and coordination of care.  MEDICATIONS: Scheduled Meds: . amLODipine  2.5 mg Oral Daily  . aspirin EC  81 mg Oral Daily  . busPIRone  7.5 mg Oral TID  . desmopressin  0.2 mg Oral Daily  . enoxaparin (LOVENOX) injection  40 mg Subcutaneous Q24H  . furosemide  40 mg Oral Daily  . levothyroxine  100 mcg Oral QAC breakfast  . losartan  100 mg Oral Daily  . meloxicam  15 mg Oral Daily  . metoprolol  50 mg Oral BID  . nortriptyline  25 mg Oral QHS  . pantoprazole  40 mg Oral Daily  . polyethylene glycol  17 g Oral Daily  . potassium chloride SA  20 mEq Oral Daily  . sodium phosphate  1 enema Rectal Once   Continuous Infusions: . sodium chloride Stopped (03/06/15 1500)   PRN Meds:.acetaminophen **OR** acetaminophen, LORazepam, morphine injection, ondansetron **OR** ondansetron (ZOFRAN) IV, oxyCODONE    PHYSICAL EXAM: Vital signs in last 24 hours: Filed Vitals:   03/06/15 1615 03/06/15 2145 03/06/15 2352 03/07/15 0652  BP: 137/62 156/81 158/70 140/66  Pulse: 86 89 95 82  Temp:  98.2 F (36.8 C) 98.2 F (36.8 C) 98.7 F (37.1 C)  TempSrc:  Oral Oral Oral  Resp:  16 16 16   SpO2: 95% 100% 100% 99%    Weight change:  There were no  vitals filed for this visit. There is no weight on file to calculate BMI.   Gen Exam: Awake and alert with clear speech.  Neck: Supple, No JVD.   Chest: B/L Clear.   CVS: S1 S2 Regular, no murmurs.  Abdomen: soft, BS +,  non tender, non distended.  Extremities: no edema, lower extremities warm to touch. Left arm in sling, left knee immobilizer in place Neurologic: Non Focal.   Skin: No Rash.   Wounds: N/A.   Intake/Output from previous day:  Intake/Output Summary (Last 24 hours) at 03/07/15 1358 Last data filed at 03/06/15 2351  Gross per 24 hour  Intake    340 ml  Output      0 ml  Net    340 ml     LAB RESULTS: CBC  Recent Labs Lab 03/06/15 1254 03/07/15 0418  WBC 8.9 7.7  HGB 13.2 11.0*  HCT 41.6 34.5*  PLT 351 308  MCV 94.8 95.6  MCH 30.1 30.5  MCHC 31.7 31.9  RDW 13.1 13.3  LYMPHSABS 1.8  --   MONOABS 0.6  --   EOSABS 0.3  --   BASOSABS 0.0  --     Chemistries   Recent Labs Lab 03/06/15 1254 03/07/15 0418  NA 137 137  K 4.6 4.0  CL 100* 103  CO2 27 29  GLUCOSE 93 121*  BUN 18 16  CREATININE 0.91 0.90  CALCIUM 9.1 8.4*    CBG: No results for input(s): GLUCAP in the last 168 hours.  GFR CrCl cannot be calculated (Unknown ideal weight.).  Coagulation profile  Recent Labs Lab 03/06/15 1254  INR 1.19    Cardiac Enzymes No results for input(s): CKMB, TROPONINI, MYOGLOBIN in the last 168 hours.  Invalid input(s): CK  Invalid input(s): POCBNP No results for input(s): DDIMER in the last 72 hours. No results for input(s): HGBA1C in the last 72 hours. No results for input(s): CHOL, HDL, LDLCALC, TRIG, CHOLHDL, LDLDIRECT in the last 72 hours. No results for input(s): TSH, T4TOTAL, T3FREE, THYROIDAB in the last 72 hours.  Invalid input(s): FREET3 No results for input(s): VITAMINB12, FOLATE, FERRITIN, TIBC, IRON, RETICCTPCT in the last 72 hours. No results for input(s): LIPASE, AMYLASE in the last 72 hours.  Urine Studies No results for input(s): UHGB, CRYS in the last 72 hours.  Invalid input(s): UACOL, UAPR, USPG, UPH, UTP, UGL, UKET, UBIL, UNIT, UROB, ULEU, UEPI, UWBC, URBC, UBAC, CAST, UCOM, BILUA  MICROBIOLOGY: No results found for this or any  previous visit (from the past 240 hour(s)).  RADIOLOGY STUDIES/RESULTS: Dg Chest 2 View  03/06/2015  CLINICAL DATA:  Fall. Recent shoulder fracture. Anterior lower chest wall pain. EXAM: CHEST  2 VIEW COMPARISON:  08/09/2010 chest radiograph. FINDINGS: Stable cardiomediastinal silhouette with top-normal heart size. No pneumothorax. No pleural effusion. No pulmonary edema. Mild curvilinear opacities are present at both lung bases. No displaced fracture in the visualized chest. Severe osteoarthritis in the bilateral glenohumeral joints. IMPRESSION: Mild curvilinear opacities at both lung bases, favor scarring or atelectasis. Electronically Signed   By: Ilona Sorrel M.D.   On: 03/06/2015 12:09   Dg Wrist Complete Left  03/06/2015  CLINICAL DATA:  Tripped and fell today. Initial encounter. The patient fell to her knees. Burning and pain in the left wrist. Personal history of left wrist fracture 2 years ago. EXAM: LEFT WRIST - COMPLETE 3+ VIEW COMPARISON:  Left wrist radiographs 02/22/2013 FINDINGS: Moderate osteopenia is present. Previous fracture has healed. No definite acute fracture  is evident. Degenerative changes extend to the DRUJ. IMPRESSION: 1. No acute abnormality. 2. Remote fractures have healed. 3. Moderate osteopenia. Electronically Signed   By: San Morelle M.D.   On: 03/06/2015 12:10   Ct Head Wo Contrast  03/06/2015  CLINICAL DATA:  Multiple falls last week. EXAM: CT HEAD WITHOUT CONTRAST TECHNIQUE: Contiguous axial images were obtained from the base of the skull through the vertex without intravenous contrast. COMPARISON:  None. FINDINGS: Ventricles, cisterns and other CSF spaces are within normal. There is no mass, mass effect, shift of midline structures or acute hemorrhage. No evidence of acute infarction. Minimal degenerative change of the temporomandibular joints. Remaining bones and soft tissues are within normal. IMPRESSION: No acute intracranial findings. Electronically  Signed   By: Marin Olp M.D.   On: 03/06/2015 16:53   Mr Brain Wo Contrast  03/07/2015  CLINICAL DATA:  Gait abnormality.  Multiple recent falls. EXAM: MRI HEAD WITHOUT CONTRAST TECHNIQUE: Multiplanar, multiecho pulse sequences of the brain and surrounding structures were obtained without intravenous contrast. COMPARISON:  Head CT 03/06/2015 FINDINGS: Mild motion artifact. There is no evidence of acute infarct, intracranial hemorrhage, mass, midline shift, or extra-axial fluid collection. Mild cerebral atrophy is within normal limits for age. Small foci of T2 hyperintensity in the subcortical and deep cerebral white matter and pons are nonspecific but compatible with mild chronic small vessel ischemic disease. Orbits are unremarkable. Paranasal sinuses and mastoid air cells are clear. Major intracranial vascular flow voids are preserved. IMPRESSION: 1. No acute intracranial abnormality. 2. Mild chronic small vessel ischemic disease. Electronically Signed   By: Logan Bores M.D.   On: 03/07/2015 12:49   Ct Shoulder Left Wo Contrast  03/07/2015  CLINICAL DATA:  Recurrent falls with left shoulder pain. Question fracture. Initial encounter. EXAM: CT OF THE LEFT SHOULDER WITHOUT CONTRAST TECHNIQUE: Multidetector CT imaging was performed according to the standard protocol. Multiplanar CT image reconstructions were also generated. COMPARISON:  Plain films left shoulder 03/06/2015. FINDINGS: The patient has a fracture of the proximal humerus which shows mild lateral displacement of approximately 0.8 cm. The fracture extends from the posterior, inferior margin of the humeral head in an anterior and inferior orientation through the surgical neck. Small cortical break is also seen in the posterior surgical neck. The fracture does not appear to involve the greater or lesser tuberosities. No other fracture is identified. The acromioclavicular joint is intact. There is severe glenohumeral osteoarthritis with marked  joint space narrowing, osteophytosis and remodeling of the glenoid. Moderate acromioclavicular degenerative change is seen. Musculature about the shoulder girdle is somewhat atrophied. No rotator cuff tear is visualized on this exam. Imaged lung parenchyma is clear. IMPRESSION: Acute fracture of the proximal humerus shows mild lateral displacement but does not appear to involve the greater or lesser tuberosities. Severe glenohumeral osteoarthritis. Moderate acromioclavicular degenerative change. Electronically Signed   By: Inge Rise M.D.   On: 03/07/2015 11:31   Dg Shoulder Left  03/06/2015  CLINICAL DATA:  Initial encounter for Pt tripped and fell this morning, she went down and hit both of her knees, then fell and landed on her left side. Known Left shoulder Fx from 2 weeks ago, and she has a previous left wrist Fx from 2014. She has bilateral knee p.*comment was truncated* EXAM: LEFT SHOULDER - 2+ VIEW COMPARISON:  None. FINDINGS: Osteopenia. Visualized portion of the left hemithorax is normal. Advanced glenohumeral joint osteoarthritis. Undersurface acromioclavicular joint degenerative change. High-riding humeral head consistent with chronic rotator cuff  insufficiency. No acute fracture or dislocation. IMPRESSION: Advanced osteoarthritis.  No acute findings. Electronically Signed   By: Abigail Miyamoto M.D.   On: 03/06/2015 12:09   Dg Knee Complete 4 Views Left  03/06/2015  CLINICAL DATA:  Knee pain secondary to a fall this morning. EXAM: LEFT KNEE - COMPLETE 4+ VIEW COMPARISON:  None. FINDINGS: There is a nondisplaced fracture of the mid patella. There is a small joint effusion. The patient has had a hemiarthroplasty in the medial compartment. There are slight degenerative changes in the patellofemoral compartment and in the lateral compartment with chondromalacia in the lateral compartment. There is slight irregularity of the lateral tibial plateau beneath the prosthesis. IMPRESSION: Acute  nondisplaced fracture of the mid patella. Electronically Signed   By: Lorriane Shire M.D.   On: 03/06/2015 12:09   Dg Knee Complete 4 Views Right  03/06/2015  CLINICAL DATA:  Initial encounter for Pt tripped and fell this morning, she went down and hit both of her knees, then fell and landed on her left side. Known Left shoulder Fx from 2 weeks ago, and she has a previous left wrist Fx from 2014. She has bilateral knee p.*comment was truncated* EXAM: RIGHT KNEE - COMPLETE 4+ VIEW COMPARISON:  None. FINDINGS: Knee arthroplasty. No acute hardware complication. No joint effusion. No acute fracture or dislocation. IMPRESSION: Expected appearance after knee arthroplasty. Electronically Signed   By: Abigail Miyamoto M.D.   On: 03/06/2015 12:07   Dg Hips Bilat With Pelvis 3-4 Views  03/06/2015  CLINICAL DATA:  Fall.  Initial evaluation. EXAM: DG HIP (WITH OR WITHOUT PELVIS) 3-4V BILAT COMPARISON:  No prior exam for comparison. FINDINGS: Diffuse osteopenia degenerative change. No acute abnormality. No evidence of fracture dislocation. IMPRESSION: Diffuse osteopenia degenerative change.  No acute abnormality. Electronically Signed   By: Marcello Moores  Register   On: 03/06/2015 12:07    Oren Binet, MD  [facility] Pager:[phone]  If 7PM-7AM, please contact night-coverage [email or web address] Password [email or web address] 03/07/2015, 1:58 PM   LOS: 1 day

## 2015-03-07 NOTE — Progress Notes (Signed)
Lisa Lopes, MD Chief Complaint: S/P fall History:  The patient has a past medical history of rheumatoid arthritis, diverticulosis, hyperthyroidism, overactive bladder, chronic low back pain, depression, cardiomegaly. She recently broke her shoulder, one week ago after a fall and is being seen by Dr. Onnie Graham with [facility] orthopedics. She comes to the emergency department because yesterday morning she was distracted and stubbed her toe falling onto her I lateral knees and then chest. She has not been able to walk since the incident happened. She complains of pain to bilateral knees, left wrist, left shoulder and sternum. She was at her daughter's house yesterday and did not want to be seen because it was Thanksgiving. She took her home medicines and ice her extremities. This morning when her pain was worse she called her primary care doctor at [facility] and they advised that she come to the emergency department, therefore EMS was called and she is transferred to the ER. She denies any loss of consciousness, hitting her head or injuring her neck. She denies pain in her hips.  Past Medical History  Diagnosis Date  . Rheumatoid arthritis(714.0)   . Unspecified essential hypertension   . Esophageal reflux   . Hypothyroidism   . Diverticulosis   . Insomnia   . OSA (obstructive sleep apnea)   . OAB (overactive bladder)   . LBP (low back pain)   . DOE (dyspnea on exertion)   . Shoulder pain, bilateral   . GERD (gastroesophageal reflux disease)   . OAB (overactive bladder)   . Depression   . Venous insufficiency   . Cardiomegaly   . Osteoarthritis     No Known Allergies  No current facility-administered medications on file prior to encounter.   Current Outpatient Prescriptions on File Prior to Encounter  Medication Sig Dispense Refill  . amLODipine (NORVASC) 2.5 MG tablet Take 1 tablet (2.5 mg total) by mouth daily. 180 tablet 3  . aspirin 81 MG tablet Take 81 mg by mouth  daily.    . busPIRone (BUSPAR) 7.5 MG tablet Take 7.5 mg by mouth 3 (three) times daily.    . cholecalciferol (VITAMIN D) 1000 UNITS tablet Take 1,000 Units by mouth daily.    Marland Kitchen desmopressin (DDAVP) 0.2 MG tablet Take 0.2 mg by mouth daily.    . furosemide (LASIX) 40 MG tablet Take 2 tablets by mouth at 8 am (80 mg), and then take 1 tablet by mouth at 2 pm (40 mg) every day (Patient taking differently: Take 40-80 mg by mouth 2 (two) times daily. Take 2 tablets by mouth at 8 am (80 mg), and then take 1 tablet by mouth at 2 pm (40 mg) every day) 270 tablet 3  . HYDROcodone-acetaminophen (NORCO) 7.5-325 MG per tablet Take 1 tablet by mouth every 6 (six) hours as needed for moderate pain.    Marland Kitchen losartan (COZAAR) 100 MG tablet Take 100 mg by mouth daily.    . meloxicam (MOBIC) 15 MG tablet Take 15 mg by mouth daily.    . metoprolol (LOPRESSOR) 50 MG tablet Take 50 mg by mouth 2 (two) times daily.    . Multiple Vitamin (MULTI-DAY VITAMINS PO) Take by mouth.    . nortriptyline (PAMELOR) 25 MG capsule Take 25 mg by mouth at bedtime.    Marland Kitchen omeprazole (PRILOSEC) 20 MG capsule Take 20 mg by mouth 2 (two) times daily before a meal.     . polyethylene glycol (MIRALAX / GLYCOLAX) packet Take 17 g by mouth daily.    Marland Kitchen  potassium chloride SA (K-DUR,KLOR-CON) 20 MEQ tablet Take 1 tablet (20 mEq total) by mouth daily. 90 tablet 3  . levothyroxine (SYNTHROID, LEVOTHROID) 75 MCG tablet Take 75 mcg by mouth daily before breakfast.      Physical Exam: Filed Vitals:   03/06/15 2352 03/07/15 0652  BP: 158/70 140/66  Pulse: 95 82  Temp: 98.2 F (36.8 C) 98.7 F (37.1 C)  Resp: 16 16  A+OX3 NVI Compartments soft/NT No laceration/abrasion Anterior left knee pain Knee immobilizer adjusted so better fitting Able to gently ext and flex knee (limited) Left shoulder pain No focal neuro deficits  Image: Dg Chest 2 View  03/06/2015  CLINICAL DATA:  Fall. Recent shoulder fracture. Anterior lower chest wall pain.  EXAM: CHEST  2 VIEW COMPARISON:  08/09/2010 chest radiograph. FINDINGS: Stable cardiomediastinal silhouette with top-normal heart size. No pneumothorax. No pleural effusion. No pulmonary edema. Mild curvilinear opacities are present at both lung bases. No displaced fracture in the visualized chest. Severe osteoarthritis in the bilateral glenohumeral joints. IMPRESSION: Mild curvilinear opacities at both lung bases, favor scarring or atelectasis. Electronically Signed   By: Ilona Sorrel M.D.   On: 03/06/2015 12:09   Dg Wrist Complete Left  03/06/2015  CLINICAL DATA:  Tripped and fell today. Initial encounter. The patient fell to her knees. Burning and pain in the left wrist. Personal history of left wrist fracture 2 years ago. EXAM: LEFT WRIST - COMPLETE 3+ VIEW COMPARISON:  Left wrist radiographs 02/22/2013 FINDINGS: Moderate osteopenia is present. Previous fracture has healed. No definite acute fracture is evident. Degenerative changes extend to the DRUJ. IMPRESSION: 1. No acute abnormality. 2. Remote fractures have healed. 3. Moderate osteopenia. Electronically Signed   By: San Morelle M.D.   On: 03/06/2015 12:10   Ct Head Wo Contrast  03/06/2015  CLINICAL DATA:  Multiple falls last week. EXAM: CT HEAD WITHOUT CONTRAST TECHNIQUE: Contiguous axial images were obtained from the base of the skull through the vertex without intravenous contrast. COMPARISON:  None. FINDINGS: Ventricles, cisterns and other CSF spaces are within normal. There is no mass, mass effect, shift of midline structures or acute hemorrhage. No evidence of acute infarction. Minimal degenerative change of the temporomandibular joints. Remaining bones and soft tissues are within normal. IMPRESSION: No acute intracranial findings. Electronically Signed   By: Marin Olp M.D.   On: 03/06/2015 16:53   Dg Shoulder Left  03/06/2015  CLINICAL DATA:  Initial encounter for Pt tripped and fell this morning, she went down and hit both  of her knees, then fell and landed on her left side. Known Left shoulder Fx from 2 weeks ago, and she has a previous left wrist Fx from 2014. She has bilateral knee p.*comment was truncated* EXAM: LEFT SHOULDER - 2+ VIEW COMPARISON:  None. FINDINGS: Osteopenia. Visualized portion of the left hemithorax is normal. Advanced glenohumeral joint osteoarthritis. Undersurface acromioclavicular joint degenerative change. High-riding humeral head consistent with chronic rotator cuff insufficiency. No acute fracture or dislocation. IMPRESSION: Advanced osteoarthritis.  No acute findings. Electronically Signed   By: Abigail Miyamoto M.D.   On: 03/06/2015 12:09   Dg Knee Complete 4 Views Left  03/06/2015  CLINICAL DATA:  Knee pain secondary to a fall this morning. EXAM: LEFT KNEE - COMPLETE 4+ VIEW COMPARISON:  None. FINDINGS: There is a nondisplaced fracture of the mid patella. There is a small joint effusion. The patient has had a hemiarthroplasty in the medial compartment. There are slight degenerative changes in the patellofemoral compartment and  in the lateral compartment with chondromalacia in the lateral compartment. There is slight irregularity of the lateral tibial plateau beneath the prosthesis. IMPRESSION: Acute nondisplaced fracture of the mid patella. Electronically Signed   By: Lorriane Shire M.D.   On: 03/06/2015 12:09   Dg Knee Complete 4 Views Right  03/06/2015  CLINICAL DATA:  Initial encounter for Pt tripped and fell this morning, she went down and hit both of her knees, then fell and landed on her left side. Known Left shoulder Fx from 2 weeks ago, and she has a previous left wrist Fx from 2014. She has bilateral knee p.*comment was truncated* EXAM: RIGHT KNEE - COMPLETE 4+ VIEW COMPARISON:  None. FINDINGS: Knee arthroplasty. No acute hardware complication. No joint effusion. No acute fracture or dislocation. IMPRESSION: Expected appearance after knee arthroplasty. Electronically Signed   By: Abigail Miyamoto M.D.   On: 03/06/2015 12:07   Dg Hips Bilat With Pelvis 3-4 Views  03/06/2015  CLINICAL DATA:  Fall.  Initial evaluation. EXAM: DG HIP (WITH OR WITHOUT PELVIS) 3-4V BILAT COMPARISON:  No prior exam for comparison. FINDINGS: Diffuse osteopenia degenerative change. No acute abnormality. No evidence of fracture dislocation. IMPRESSION: Diffuse osteopenia degenerative change.  No acute abnormality. Electronically Signed   By: Marcello Moores  Register   On: 03/06/2015 12:07    A/P:  Patient s/p fall yesterday with increasing left knee. Non-displaced patella fracture Extensor mechanism appears intact Compartments soft/NT Plan: 1. Will notify Dr Onnie Graham about patient.  Order CT ans arrange f/u for her left shoulder fracture 2. Knee immobilizer for patella fracture - patient has underlying DJD and has seen Dr Maureen Ralphs in the past.  Will arrange f/u with him for patella fx as well as potential for TKR in the future 3. Wrist splint for increasing left wrist pain - can f/u with Dr Amedeo Plenty as outpatient if necessary. 4. Mag citrate / enema for prolonged constipation 5. May require CIR/SNF

## 2015-03-07 NOTE — Progress Notes (Signed)
Rehab Admissions Coordinator Note:  Patient was screened by Cleatrice Burke for appropriateness for an Inpatient Acute Rehab Consult per OT recommendation.  At this time, we are recommending SNF rehab. Pt will not be Mod I to return home in a short rehab LOS. She needs 24/7 care at home if ever to return home with history multiple falls. Recommend SNF at this time.Cleatrice Burke 03/07/2015, 1:57 PM  I can be reached at [phone].

## 2015-03-07 NOTE — Progress Notes (Signed)
Placed patient on CPAP for the night with minimum pressure set at 4cm and maximum pressure set at 20cm. Patient was unable to tolerate CPAP after only a few minutes. Patient was removed from CPAP, will continue to monitor patient.

## 2015-03-07 NOTE — Evaluation (Signed)
Occupational Therapy Evaluation Patient Details Name: Lisa Mcclure MRN: [id number] DOB: 03-29-1933 Today's Date: 03/07/2015    History of Present Illness pt presents with multiple falls sustaining L "shoulder" fx 1 week ago and fell on Thanksgiving Day resulting in L Patella fx.  pt with hx of falls, bil wrist fxs, L rib fxs, R TKA, RA, THN, Depression, and OSA.     Clinical Impression   Pt reports she was independent with ADLs prior to fall resulting in shoulder fx ~2 weeks ago. Pt has a history of falls (2 within the past 2 weeks resulting in shoulder and patellar fxs). Pt currently overall max assist for ADLs and min assist +2 for stand pivot transfers. Pt is very motivated to participate in therapy and would benefit from CIR level therapies in order to maximize independence and safety with ADLs and functional mobility required for safe d/c home. Will continue to follow pt acutely to increase independence with UB and LB ADLs and toilet transfers.     Follow Up Recommendations  CIR;Supervision/Assistance - 24 hour (If pt does not qualify for CIR, will need SNF placement )    Equipment Recommendations  Other (comment) (TBD)    Recommendations for Other Services Rehab consult     Precautions / Restrictions Precautions Precautions: Fall Required Braces or Orthoses: Knee Immobilizer - Left;Sling Knee Immobilizer - Left: On at all times Restrictions Weight Bearing Restrictions: Yes LLE Weight Bearing: Weight bearing as tolerated Other Position/Activity Restrictions: No orders for L UE, but maintained NWBing and in sling.      Mobility Bed Mobility               General bed mobility comments: Pt OOB on BSC upon arrival  Transfers Overall transfer level: Needs assistance Equipment used: 1 person hand held assist Transfers: Sit to/from Stand;Stand Pivot Transfers Sit to Stand: Min assist;+2 physical assistance Stand pivot transfers: Min assist;+2 physical  assistance       General transfer comment: VC for hand placement. Sit to stand from [facility] x 1, stand pivot from [facility] to chair x 1    Balance Overall balance assessment: Needs assistance;History of Falls         Standing balance support: Single extremity supported;During functional activity Standing balance-Leahy Scale: Poor                              ADL Overall ADL's : Needs assistance/impaired Eating/Feeding: Set up;Sitting   Grooming: Minimal assistance;Sitting   Upper Body Bathing: Maximal assistance;Sitting   Lower Body Bathing: Maximal assistance;Sit to/from stand   Upper Body Dressing : Maximal assistance;Sitting   Lower Body Dressing: Maximal assistance;Sit to/from stand Lower Body Dressing Details (indicate cue type and reason): Max assist to don knee immoblizer Toilet Transfer: Minimal assistance;Stand-pivot;BSC (hand held assist )   Toileting- Clothing Manipulation and Hygiene: Total assistance;Sit to/from stand         General ADL Comments: Pts daughter present for OT session. Pt with history of falls (2 within the past 2 weeks).      Vision     Perception     Praxis      Pertinent Vitals/Pain Pain Assessment: 0-10 Pain Score: 8  Pain Location: L shoulder, L knee Pain Descriptors / Indicators: Aching;Grimacing Pain Intervention(s): Limited activity within patient's tolerance;Monitored during session;Repositioned     Hand Dominance Right   Extremity/Trunk Assessment Upper Extremity Assessment Upper Extremity Assessment: RUE deficits/detail;LUE deficits/detail RUE  Deficits / Details: Shoulder ROM 2/4, overall decreased ROM due to arthritis. Strength at least 3/5 overall LUE Deficits / Details: Maintained in sling and NWBing throughout session.  Sensation intact. LUE: Unable to fully assess due to immobilization   Lower Extremity Assessment Lower Extremity Assessment: Defer to PT evaluation   Cervical / Trunk Assessment Cervical  / Trunk Assessment: Kyphotic   Communication Communication Communication: No difficulties   Cognition Arousal/Alertness: Awake/alert Behavior During Therapy: WFL for tasks assessed/performed Overall Cognitive Status: Within Functional Limits for tasks assessed                     General Comments       Exercises       Shoulder Instructions      Home Living Family/patient expects to be discharged to:: Unsure Living Arrangements: Alone Available Help at Discharge: Family;Available PRN/intermittently (can only check on pt ) Type of Home: House       Home Layout: One level               Home Equipment: Cane - single point   Additional Comments: pt's daughter is CG for her husband who had a recent surgery.        Prior Functioning/Environment Level of Independence: Independent with assistive device(s)        Comments: Occasionally used cane on outdoor surfaces.    OT Diagnosis: Generalized weakness;Acute pain   OT Problem List: Decreased strength;Decreased range of motion;Decreased activity tolerance;Impaired balance (sitting and/or standing);Decreased safety awareness;Decreased knowledge of use of DME or AE;Decreased knowledge of precautions;Obesity;Pain;Impaired UE functional use   OT Treatment/Interventions: Self-care/ADL training;DME and/or AE instruction;Energy conservation;Patient/family education;Therapeutic activities    OT Goals(Current goals can be found in the care plan section) Acute Rehab OT Goals Patient Stated Goal: to stop falling OT Goal Formulation: With patient/family Time For Goal Achievement: 03/21/15 Potential to Achieve Goals: Good ADL Goals Pt Will Perform Upper Body Bathing: with min assist;sitting Pt Will Perform Lower Body Bathing: with min assist;sit to/from stand Pt Will Perform Upper Body Dressing: with min assist;sitting Pt Will Perform Lower Body Dressing: with min assist;sit to/from stand Pt Will Transfer to Toilet:  with min guard assist;ambulating;bedside commode (over toilet) Pt Will Perform Toileting - Clothing Manipulation and hygiene: with min assist;sit to/from stand  OT Frequency: Min 3X/week   Barriers to D/C: Decreased caregiver support          Co-evaluation              End of Session Equipment Utilized During Treatment: Gait belt;Rolling walker;Left knee immobilizer;Other (comment) (sling)  Activity Tolerance: Patient tolerated treatment well Patient left: in chair;with call bell/phone within reach;with family/visitor present   Time: [id number] OT Time Calculation (min): 35 min Charges:  OT General Charges $OT Visit: 1 Procedure OT Evaluation $Initial OT Evaluation Tier I: 1 Procedure OT Treatments $Self Care/Home Management : 8-22 mins G-Codes:     Binnie Kand M.S., OTR/L Pager: [phone]  03/07/2015, 9:27 AM

## 2015-03-08 DIAGNOSIS — R600 Localized edema: Secondary | ICD-10-CM

## 2015-03-08 DIAGNOSIS — S82001A Unspecified fracture of right patella, initial encounter for closed fracture: Secondary | ICD-10-CM

## 2015-03-08 DIAGNOSIS — W19XXXA Unspecified fall, initial encounter: Secondary | ICD-10-CM

## 2015-03-08 DIAGNOSIS — S82002A Unspecified fracture of left patella, initial encounter for closed fracture: Principal | ICD-10-CM

## 2015-03-08 LAB — CBC
HCT: 37.3 % (ref 36.0–46.0)
HEMOGLOBIN: 11.9 g/dL — AB (ref 12.0–15.0)
MCH: 30.5 pg (ref 26.0–34.0)
MCHC: 31.9 g/dL (ref 30.0–36.0)
MCV: 95.6 fL (ref 78.0–100.0)
PLATELETS: 279 10*3/uL (ref 150–400)
RBC: 3.9 MIL/uL (ref 3.87–5.11)
RDW: 13.3 % (ref 11.5–15.5)
WBC: 9.8 10*3/uL (ref 4.0–10.5)

## 2015-03-08 LAB — BASIC METABOLIC PANEL
ANION GAP: 7 (ref 5–15)
BUN: 16 mg/dL (ref 6–20)
CHLORIDE: 102 mmol/L (ref 101–111)
CO2: 27 mmol/L (ref 22–32)
Calcium: 8.5 mg/dL — ABNORMAL LOW (ref 8.9–10.3)
Creatinine, Ser: 0.68 mg/dL (ref 0.44–1.00)
GFR calc Af Amer: >60 mL/min (ref >60)
Glucose, Bld: 99 mg/dL (ref 65–99)
POTASSIUM: 4.6 mmol/L (ref 3.5–5.1)
SODIUM: 136 mmol/L (ref 135–145)

## 2015-03-08 MED ORDER — HYDROCODONE-ACETAMINOPHEN 7.5-325 MG PO TABS: 1.0000 | ORAL_TABLET | Freq: Four times a day (QID) | ORAL | Status: DC | PRN

## 2015-03-08 NOTE — Clinical Social Work Note (Signed)
Clinical Social Work Assessment  Patient Details  Name: Lisa Mcclure MRN: [id number] Date of Birth: [birth year]  Date of referral:  03/08/15               Reason for consult:  Facility Placement                Permission sought to share information with:  Family Supports Permission granted to share information::  Yes, Verbal Permission Granted  Name::     Government social research officer::   ([place] SNFs)  Relationship::  daughter  Contact Information:  [phone]  Housing/Transportation Living arrangements for the past 2 months:  [place] of Information:  Patient, Adult Children Patient Interpreter Needed:  None Criminal Activity/Legal Involvement Pertinent to Current Situation/Hospitalization:  No - Comment as needed Significant Relationships:  Adult Children Lives with:  Self, Adult Children Do you feel safe going back to the place where you live?  Yes Need for family participation in patient care:  No ([place])  Care giving concerns:  Needs SNF placement   Social Worker assessment / plan: Met with Pt and family at bedside.  Pt is hopeful for CIR but understands that SNF may be the only option.  Pt agreeable to SNF search.  Employment status:  Retired Forensic scientist:  [employer] PT Recommendations:  [place] / Referral to community resources:   ([place])  Patient/Family's Response to care:  Pt and family in agreement as exploring SNF as a backup plan to CIR.  Patient/Family's Understanding of and Emotional Response to Diagnosis, Current Treatment, and Prognosis:  Pt and family understand that SNF will need to be explored as a backup plan to CIR and feel ok about the possibility of needing to go to SNF.  Emotional Assessment Appearance:  Appears stated age Attitude/Demeanor/Rapport:   (cooperative) Affect (typically observed):  Accepting Orientation:  Oriented to Self, Oriented to Place, Oriented to  Time,  Oriented to Situation Alcohol / Substance use:  Other (n/a) Psych involvement (Current and /or in the community):  No (Comment)  Discharge Needs  Concerns to be addressed:  No discharge needs identified Readmission within the last 30 days:    Current discharge risk:  None Barriers to Discharge:      Lisa Bash, LCSW 03/08/2015, 3:02 PM

## 2015-03-08 NOTE — Progress Notes (Signed)
Pt refused cpap.  Cpap removed from room.

## 2015-03-08 NOTE — Clinical Social Work Placement (Signed)
   CLINICAL SOCIAL WORK PLACEMENT  NOTE  Date:  03/08/2015  Patient Details  Name: Lisa Mcclure MRN: [id number] Date of Birth: 02-11-1933  Clinical Social Work is seeking post-discharge placement for this patient at the [place] level of care (*CSW will initial, date and re-position this form in  chart as items are completed):  Yes   Patient/family provided with [place] Work Department's list of facilities offering this level of care within the geographic area requested by the patient (or if unable, by the patient's family).  Yes   Patient/family informed of their freedom to choose among providers that offer the needed level of care, that participate in Medicare, Medicaid or managed care program needed by the patient, have an available bed and are willing to accept the patient.  Yes   Patient/family informed of [facility]'s ownership interest in [facility] and [facility], as well as of the fact that they are under no obligation to receive care at these facilities.  PASRR submitted to EDS on 03/08/15     PASRR number received on 03/08/15     Existing PASRR number confirmed on       FL2 transmitted to all facilities in geographic area requested by pt/family on 03/08/15     FL2 transmitted to all facilities within larger geographic area on       Patient informed that his/her managed care company has contracts with or will negotiate with certain facilities, including the following:            Patient/family informed of bed offers received.  Patient chooses bed at       Physician recommends and patient chooses bed at      Patient to be transferred to   on  .  Patient to be transferred to facility by       Patient family notified on   of transfer.  Name of family member notified:        PHYSICIAN       Additional Comment:    _______________________________________________ Matilde Bash, [place] 03/08/2015,  3:07 PM

## 2015-03-08 NOTE — NC FL2 (Signed)
[facility] LEVEL OF CARE SCREENING TOOL     IDENTIFICATION  Patient Name: Lisa Mcclure Birthdate: 07-08-1932 Sex: female Admission Date (Current Location): 03/06/2015  [facility] and [place] Number:     Facility and Address:  [facility], [place] [address], [place], [place] [address]      Provider Number: [id number]  Attending Physician Name and Address:  Thurnell Lose, MD  Relative Name and Phone Number:       Current Level of Care: SNF Recommended Level of Care: [place] Prior Approval Number:    Date Approved/Denied:   PASRR Number: [id number]  Discharge Plan: SNF    Current Diagnoses: Patient Active Problem List   Diagnosis Date Noted  . Patellar fracture 03/06/2015  . Bilateral lower extremity edema 03/06/2015  . DOE (dyspnea on exertion) 03/06/2015  . Frequent falls 03/06/2015  . Closed fracture of patella 03/06/2015  . Fall   . CHF (congestive heart failure) ([place]) 02/19/2015  . Pain management 11/04/2013  . Humeral fracture 11/04/2013  . Rib fracture 11/04/2013  . Hypothyroid 11/04/2013  . Hypertension 11/04/2013  . OSA (obstructive sleep apnea) 11/13/2008  . BOOP (bronchiolitis obliterans with organizing pneumonia) ([place]) 08/25/2008    Orientation ACTIVITIES/SOCIAL BLADDER RESPIRATION    Self, Time, Situation  Active Continent Normal  BEHAVIORAL SYMPTOMS/MOOD NEUROLOGICAL BOWEL NUTRITION STATUS      Continent  (regular)  PHYSICIAN VISITS COMMUNICATION OF NEEDS Height & Weight Skin    Verbally   217 lbs. Normal          AMBULATORY STATUS RESPIRATION    Assist extensive Normal      Personal Care Assistance Level of Assistance  Bathing, Dressing Bathing Assistance: Limited assistance   Dressing Assistance: Limited assistance      Functional Limitations Info                SPECIAL CARE FACTORS FREQUENCY                      Additional Factors Info                   Current Medications (03/08/2015): Current Facility-Administered Medications  Medication Dose Route Frequency Provider Last Rate Last Dose  . 0.9 %  sodium chloride infusion   Intravenous Continuous Samella Parr, NP   Stopped at 03/06/15 1500  . acetaminophen (TYLENOL) tablet 650 mg  650 mg Oral Q6H PRN Samella Parr, NP       Or  . acetaminophen (TYLENOL) suppository 650 mg  650 mg Rectal Q6H PRN Samella Parr, NP      . aspirin EC tablet 81 mg  81 mg Oral Daily Samella Parr, NP   81 mg at 03/08/15 0826  . busPIRone (BUSPAR) tablet 7.5 mg  7.5 mg Oral TID Samella Parr, NP   7.5 mg at 03/08/15 0825  . desmopressin (DDAVP) tablet 0.2 mg  0.2 mg Oral Daily Samella Parr, NP   0.2 mg at 03/07/15 2000  . enoxaparin (LOVENOX) injection 40 mg  40 mg Subcutaneous Q24H Samella Parr, NP   40 mg at 03/07/15 1755  . furosemide (LASIX) tablet 40 mg  40 mg Oral Daily Belkys A Regalado, MD   40 mg at 03/08/15 0824  . levothyroxine (SYNTHROID, LEVOTHROID) tablet 100 mcg  100 mcg Oral QAC breakfast Samella Parr, NP   100 mcg at 03/08/15 0824  . LORazepam (  ATIVAN) injection 1 mg  1 mg Intravenous Once PRN Jonetta Osgood, MD      . losartan (COZAAR) tablet 100 mg  100 mg Oral Daily Samella Parr, NP   100 mg at 03/07/15 2115  . meloxicam (MOBIC) tablet 15 mg  15 mg Oral Daily Samella Parr, NP   15 mg at 03/06/15 1846  . metoprolol tartrate (LOPRESSOR) tablet 50 mg  50 mg Oral BID Samella Parr, NP   50 mg at 03/08/15 0826  . morphine 2 MG/ML injection 1 mg  1 mg Intravenous Q2H PRN Samella Parr, NP   1 mg at 03/07/15 2108  . nortriptyline (PAMELOR) capsule 25 mg  25 mg Oral QHS Samella Parr, NP   25 mg at 03/07/15 2200  . ondansetron (ZOFRAN) tablet 4 mg  4 mg Oral Q6H PRN Samella Parr, NP       Or  . ondansetron [facility]) injection 4 mg  4 mg Intravenous Q6H PRN Samella Parr, NP      . oxyCODONE (Oxy IR/ROXICODONE) immediate release tablet 5 mg  5 mg Oral Q4H PRN  Samella Parr, NP   5 mg at 03/08/15 0825  . pantoprazole (PROTONIX) EC tablet 40 mg  40 mg Oral Daily Samella Parr, NP   40 mg at 03/08/15 0830  . polyethylene glycol (MIRALAX / GLYCOLAX) packet 17 g  17 g Oral Daily Samella Parr, NP   17 g at 03/07/15 1350  . potassium chloride SA (K-DUR,KLOR-CON) CR tablet 20 mEq  20 mEq Oral Daily Belkys A Regalado, MD   20 mEq at 03/08/15 0825  . sodium phosphate (FLEET) 7-19 GM/118ML enema 1 enema  1 enema Rectal Once Melina Schools, MD   1 enema at 03/07/15 2000   Do not use this list as official medication orders. Please verify with discharge summary.  Discharge Medications:   Medication List    STOP taking these medications        amLODipine 2.5 MG tablet  Commonly known as:  NORVASC     meloxicam 15 MG tablet  Commonly known as:  MOBIC      TAKE these medications        aspirin 81 MG tablet  Take 81 mg by mouth daily.     busPIRone 7.5 MG tablet  Commonly known as:  BUSPAR  Take 7.5 mg by mouth 3 (three) times daily.     cholecalciferol 1000 UNITS tablet  Commonly known as:  VITAMIN D  Take 1,000 Units by mouth daily.     desmopressin 0.2 MG tablet  Commonly known as:  DDAVP  Take 0.2 mg by mouth daily.     furosemide 40 MG tablet  Commonly known as:  LASIX  Take 2 tablets by mouth at 8 am (80 mg), and then take 1 tablet by mouth at 2 pm (40 mg) every day     HYDROcodone-acetaminophen 7.5-325 MG tablet  Commonly known as:  NORCO  Take 1 tablet by mouth every 6 (six) hours as needed for moderate pain.     levothyroxine 100 MCG tablet  Commonly known as:  SYNTHROID, LEVOTHROID  Take 100 mcg by mouth daily before breakfast.     levothyroxine 75 MCG tablet  Commonly known as:  SYNTHROID, LEVOTHROID  Take 75 mcg by mouth daily before breakfast.     losartan 100 MG tablet  Commonly known as:  COZAAR  Take 100 mg by mouth  daily.     metoprolol 50 MG tablet  Commonly known as:  LOPRESSOR  Take 50 mg by mouth 2  (two) times daily.     MULTI-DAY VITAMINS PO  Take by mouth.     nortriptyline 25 MG capsule  Commonly known as:  PAMELOR  Take 25 mg by mouth at bedtime.     omeprazole 20 MG capsule  Commonly known as:  PRILOSEC  Take 20 mg by mouth 2 (two) times daily before a meal.     polyethylene glycol packet  Commonly known as:  MIRALAX / GLYCOLAX  Take 17 g by mouth daily.     potassium chloride SA 20 MEQ tablet  Commonly known as:  K-DUR,KLOR-CON  Take 1 tablet (20 mEq total) by mouth daily.        Relevant Imaging Results:  Relevant Lab Results:  Recent Labs    Additional Information    Matilde Bash, LCSW

## 2015-03-08 NOTE — Discharge Instructions (Signed)
Follow with Primary MD [name], MD in 7 days   Get CBC, CMP, 2 view Chest X ray checked  by Primary MD next visit.    Activity: As tolerated with Full fall precautions use walker/cane & assistance as needed   Disposition SNF   Diet: Heart Healthy  with feeding assistance and aspiration precautions.  For Heart failure patients - Check your Weight same time everyday, if you gain over 2 pounds, or you develop in leg swelling, experience more shortness of breath or chest pain, call your Primary MD immediately. Follow Cardiac Low Salt Diet and 1.5 lit/day fluid restriction.   On your next visit with your primary care physician please Get Medicines reviewed and adjusted.   Please request your Prim.MD to go over all Hospital Tests and Procedure/Radiological results at the follow up, please get all Hospital records sent to your Prim MD by signing hospital release before you go home.   If you experience worsening of your admission symptoms, develop shortness of breath, life threatening emergency, suicidal or homicidal thoughts you must seek medical attention immediately by calling 911 or calling your MD immediately  if symptoms less severe.  You Must read complete instructions/literature along with all the possible adverse reactions/side effects for all the Medicines you take and that have been prescribed to you. Take any new Medicines after you have completely understood and accpet all the possible adverse reactions/side effects.   Do not drive, operating heavy machinery, perform activities at heights, swimming or participation in water activities or provide baby sitting services if your were admitted for syncope or siezures until you have seen by Primary MD or a Neurologist and advised to do so again.  Do not drive when taking Pain medications.    Do not take more than prescribed Pain, Sleep and Anxiety Medications  Special Instructions: If you have smoked or chewed Tobacco  in  the last 2 yrs please stop smoking, stop any regular Alcohol  and or any Recreational drug use.  Wear Seat belts while driving.   Please note  You were cared for by a hospitalist during your hospital stay. If you have any questions about your discharge medications or the care you received while you were in the hospital after you are discharged, you can call the unit and asked to speak with the hospitalist on call if the hospitalist that took care of you is not available. Once you are discharged, your primary care physician will handle any further medical issues. Please note that NO REFILLS for any discharge medications will be authorized once you are discharged, as it is imperative that you return to your primary care physician (or establish a relationship with a primary care physician if you do not have one) for your aftercare needs so that they can reassess your need for medications and monitor your lab values.

## 2015-03-08 NOTE — Progress Notes (Signed)
Orthopedic Tech Progress Note Patient Details:  Lisa Mcclure 1932/12/07 [id number]  Ortho Devices Type of Ortho Device: Knee Immobilizer Ortho Device/Splint Location: Replacement knee immobilizer Ortho Device/Splint Interventions: Application   [name] 03/08/2015, 12:34 PM

## 2015-03-08 NOTE — Discharge Summary (Addendum)
Lisa Mcclure, is a 81 y.o. female  DOB April 20, 1932  MRN [id number].  Admission date:  03/06/2015  Admitting Physician  Elmarie Shiley, MD  Discharge Date:  03/10/2015   Primary MD  [name], MD  Recommendations for primary care physician for things to follow:   Monitor weight, BMP closely. Outpatient orthopedics follow-up as below.   Admission Diagnosis  Frequent falls [R29.6] Fall, initial encounter [W19.XXXA] Patellar fracture, left, closed, initial encounter [S82.002A]   Discharge Diagnosis  Frequent falls [R29.6] Fall, initial encounter [W19.XXXA] Patellar fracture, left, closed, initial encounter [S82.002A]     Principal Problem:   Patellar fracture Active Problems:   OSA (obstructive sleep apnea)   Hypothyroid   Hypertension   Bilateral lower extremity edema   DOE (dyspnea on exertion)   Frequent falls   Closed fracture of patella   Fall      Past Medical History  Diagnosis Date  . Rheumatoid arthritis(714.0)   . Unspecified essential hypertension   . Esophageal reflux   . Hypothyroidism   . Diverticulosis   . Insomnia   . OSA (obstructive sleep apnea)   . OAB (overactive bladder)   . LBP (low back pain)   . DOE (dyspnea on exertion)   . Shoulder pain, bilateral   . GERD (gastroesophageal reflux disease)   . OAB (overactive bladder)   . Depression   . Venous insufficiency   . Cardiomegaly   . Osteoarthritis     Past Surgical History  Procedure Laterality Date  . Appendectomy  1953  . Vesicovaginal fistula closure w/ tah    . Breast biopsy Right 1980  . Knee arthroscopy w/ autogenous cartilage implantation (aci) procedure Left 1994, 1995  . Knee arthroscopy Right 1996, 2010  . Shoulder surgery  1990  . Cystocele repair    . Cesarean section      1957,  1961, 1964  . Wrist surgery  1967  . Total abdominal hysterectomy  1972  . Bladder abduction-1996  1996  . Cosmetic surgery  1996       HPI  from the history and physical done on the day of admission:    This is an 81 year old female patient with past medical history of hypertension on 3 medications, sleep apnea on CPAP, and surgical hypothyroidism. She has recently been evaluated by cardiology as an outpatient because of lower extremity edema with cough and dyspnea on exertion. Outpatient echocardiogram was planned but patient unable to pursue secondary to issues with recent fall with shoulder injury last week. She was evaluated by orthopedic physician at that time and was placed in upper extremity sling. Patient reports that since injuring her arm she's had difficulty with managing around the house especially trying to get her underclothing up and down when going to the bathroom (recent start on Lasix by cardiology). On Thanksgiving morning she became distracted and fell forward landing on her knees and chest area and presented to the ER because of pain in the bilateral knees, left wrist, left shoulder  and sternum. Because it was Thanksgiving the patient chose not to present to the ER yesterday. She does have medications that she takes at home for arthritis including Vicodin and she opted to take that medication along with application of ice to the knees. This morning the pain was worse and she was unable to walk so she called her primary care physician who recommended she come to the ER for further evaluation. She denied any loss of consciousness, head or neck injuries and no pain in her hips.  In the ER the patient was mildly hypertensive with a blood pressure 161/65, she was afebrile, pulse was regular respirations were regular with room air saturations 98%. Patient underwent multiple x-rays including x-ray of the left wrist left shoulder, 2 view chest x-ray, bilateral hip and pelvis x-rays,  bilateral knee x-rays all of which were negative except for left knee x-ray which revealed an acute nondisplaced fracture of the mid patella. Patient was evaluated by PT in the ER; despite working with the patient she was unable to safely navigate/manage ambulation while in the ER secondary to severe pain causing her to have inability to ambulate in addition to the previous arm injury requiring sling immobilization. It was recommended that she be admitted for pain control and continued physical therapy with possibility of being admitted to a skilled nurse facility for rehabilitative therapies. In the ER her laboratory data was unremarkable.  During my time of taking history with patient she reports some other concerning symptoms that occurred since mid summer. She reports that in July while sitting still she noticed some left-sided numbness which lasted for a period of time but then resolved without recurrence. It seems at the same time her blood pressure had been elevated and not well controlled. These findings were discussed with her primary care physician. In the past several months she's had at least one significant episode of difficulty in word finding and this also appeared to be related to when her blood pressure was elevated. As noted she's had recent issues with lower extremity edema and dyspnea on exertion and outpatient evaluation to determine if she has underlying CHF is in process. Patient was started on twice a day Lasix by the cardiologist and has had good urinary output. She did request a dosage change after her shoulder injury because she was having difficulty getting her underclothing up and down after going to the bathroom. The cardiologist has temporarily decreased the Lasix to 40 mg once a day.     Hospital Course:     Left Patellar fracture: Secondary to a mechanical fall-Ortho consulted-recommendations are for knee immobilizer. Continue supportive care. PT eval completed-SNF will be  discharged to SNF.  Recent left shoulder fracture: This was approximately one week prior to this admission-seen by Dr.Supple as an outpatient. Continue with arm sling-seen by orthopedics-actual confirmed on CT scan, continue left shoulder sling with outpatient orthopedics follow-up within a week.  Lower extremity edema: Mild edema present. Recently seen by cardiology-felt to have acute diastolic heart failure-and started on Lasix-unfortunately has history of urinary incontinence/nocturnal enuresis-and feels very difficult to take Lasix with all these issues. For now would continue with Lasix daily x 2 more days till 03-11-15, check daily weights and see if we can use Lasix as needed if weight is increasing beyond her dry weight. UA stable without any proteinuria. Discontinued amlodipine  Hypertension: Continue losartan, metoprolol and Lasix. Given lower extremity edema-will discontinue amlodipine. Follow-and chest accordingly  History of frequent falls: None of  them have been secondary to syncopal episode. Apparently patient has had numerous falls over the past few years. Have asked patient to follow with PCP and minimize benzodiazepine/narcotics/sedatives, have also advised patient to remove loose carpeting-ensure adequate lighting around the house. Patient also instructed to follow with ophthalmologist as well.  Note- CT head/MRI brain negative for any acute abnormalities.  Note she was very mildly orthostatic, Norvasc has been discontinued, apply TED stockings and monitor. If needed add midodrine.   Hypothyroidism: Continue with levothyroxine  OSA (obstructive sleep apnea): Likely related to obesity-continue CPAP daily at bedtime  Overactive bladder/nocturnal enuresis: Continue with desmopressin.  Musculoskeletal left-sided pleuritic chest pain. Inconclusive CTA, negative VQ scan, negative lower extremity venous duplex. Stable EKG. Continue supportive care.     Discharge Condition:  Fair  Follow UP  Follow-up Information    Follow up with [name], MD. Schedule an appointment as soon as possible for a visit in 1 week.   Specialty:  Internal Medicine   Contact information:   [place] [address] [phone]       Follow up with Gearlean Alf, MD. Schedule an appointment as soon as possible for a visit in 1 week.   Specialty:  Orthopedic Surgery   Why:  Shoulder and patella fracture   Contact information:   [address] [place] [address] [phone]       Follow up with Metta Clines SUPPLE, MD. Schedule an appointment as soon as possible for a visit in 1 week.   Specialty:  Orthopedic Surgery   Why:  Patella fracture   Contact information:   [address] [place] [address] [phone]       Follow up with Paulene Floor, MD. Schedule an appointment as soon as possible for a visit in 1 week.   Specialty:  Orthopedic Surgery   Why:  Wrist injury   Contact information:   [address] [place] [address] [phone]        Consults obtained - orthopedics Dr. Rolena Infante  Diet and Activity recommendation: See Discharge Instructions below  Discharge Instructions           Discharge Instructions    Diet - low sodium heart healthy    Complete by:  As directed      Discharge instructions    Complete by:  As directed   Follow with Primary MD [name], MD in 7 days   Get CBC, CMP, 2 view Chest X ray checked  by Primary MD next visit.    Activity: As tolerated with Full fall precautions use walker/cane & assistance as needed   Disposition SNF   Diet: Heart Healthy  with feeding assistance and aspiration precautions.  For Heart failure patients - Check your Weight same time everyday, if you gain over 2 pounds, or you develop in leg swelling, experience more shortness of breath or chest pain, call your Primary MD immediately. Follow Cardiac Low  Salt Diet and 1.5 lit/day fluid restriction.   On your next visit with your primary care physician please Get Medicines reviewed and adjusted.   Please request your Prim.MD to go over all Hospital Tests and Procedure/Radiological results at the follow up, please get all Hospital records sent to your Prim MD by signing hospital release before you go home.   If you experience worsening of your admission symptoms, develop shortness of breath, life threatening emergency, suicidal or homicidal thoughts you must seek medical attention  immediately by calling 911 or calling your MD immediately  if symptoms less severe.  You Must read complete instructions/literature along with all the possible adverse reactions/side effects for all the Medicines you take and that have been prescribed to you. Take any new Medicines after you have completely understood and accpet all the possible adverse reactions/side effects.   Do not drive, operating heavy machinery, perform activities at heights, swimming or participation in water activities or provide baby sitting services if your were admitted for syncope or siezures until you have seen by Primary MD or a Neurologist and advised to do so again.  Do not drive when taking Pain medications.    Do not take more than prescribed Pain, Sleep and Anxiety Medications  Special Instructions: If you have smoked or chewed Tobacco  in the last 2 yrs please stop smoking, stop any regular Alcohol  and or any Recreational drug use.  Wear Seat belts while driving.   Please note  You were cared for by a hospitalist during your hospital stay. If you have any questions about your discharge medications or the care you received while you were in the hospital after you are discharged, you can call the unit and asked to speak with the hospitalist on call if the hospitalist that took care of you is not available. Once you are discharged, your primary care physician will handle any  further medical issues. Please note that NO REFILLS for any discharge medications will be authorized once you are discharged, as it is imperative that you return to your primary care physician (or establish a relationship with a primary care physician if you do not have one) for your aftercare needs so that they can reassess your need for medications and monitor your lab values.             Discharge Medications       Medication List    STOP taking these medications        amLODipine 2.5 MG tablet  Commonly known as:  NORVASC     meloxicam 15 MG tablet  Commonly known as:  MOBIC      TAKE these medications        aspirin 81 MG tablet  Take 81 mg by mouth daily.     busPIRone 7.5 MG tablet  Commonly known as:  BUSPAR  Take 7.5 mg by mouth 3 (three) times daily.     cholecalciferol 1000 UNITS tablet  Commonly known as:  VITAMIN D  Take 1,000 Units by mouth daily.     desmopressin 0.2 MG tablet  Commonly known as:  DDAVP  Take 0.2 mg by mouth daily.     furosemide 40 MG tablet  Commonly known as:  LASIX  Take 2 tablets by mouth at 8 am (80 mg), and then take 1 tablet by mouth at 2 pm (40 mg) every day     HYDROcodone-acetaminophen 7.5-325 MG tablet  Commonly known as:  NORCO  Take 1 tablet by mouth every 6 (six) hours as needed for moderate pain.     levothyroxine 100 MCG tablet  Commonly known as:  SYNTHROID, LEVOTHROID  Take 100 mcg by mouth daily before breakfast.     levothyroxine 75 MCG tablet  Commonly known as:  SYNTHROID, LEVOTHROID  Take 75 mcg by mouth daily before breakfast.     losartan 100 MG tablet  Commonly known as:  COZAAR  Take 100 mg by mouth daily.     metoprolol 50  MG tablet  Commonly known as:  LOPRESSOR  Take 50 mg by mouth 2 (two) times daily.     MULTI-DAY VITAMINS PO  Take by mouth.     nortriptyline 25 MG capsule  Commonly known as:  PAMELOR  Take 25 mg by mouth at bedtime.     omeprazole 20 MG capsule  Commonly known as:   PRILOSEC  Take 20 mg by mouth 2 (two) times daily before a meal.     polyethylene glycol packet  Commonly known as:  MIRALAX / GLYCOLAX  Take 17 g by mouth daily.     potassium chloride SA 20 MEQ tablet  Commonly known as:  K-DUR,KLOR-CON  Take 1 tablet (20 mEq total) by mouth daily.        Major procedures and Radiology Reports - PLEASE review detailed and final reports for all details, in brief -       Dg Chest 2 View  03/06/2015  CLINICAL DATA:  Fall. Recent shoulder fracture. Anterior lower chest wall pain. EXAM: CHEST  2 VIEW COMPARISON:  08/09/2010 chest radiograph. FINDINGS: Stable cardiomediastinal silhouette with top-normal heart size. No pneumothorax. No pleural effusion. No pulmonary edema. Mild curvilinear opacities are present at both lung bases. No displaced fracture in the visualized chest. Severe osteoarthritis in the bilateral glenohumeral joints. IMPRESSION: Mild curvilinear opacities at both lung bases, favor scarring or atelectasis. Electronically Signed   By: Ilona Sorrel M.D.   On: 03/06/2015 12:09   Dg Wrist Complete Left  03/06/2015  CLINICAL DATA:  Tripped and fell today. Initial encounter. The patient fell to her knees. Burning and pain in the left wrist. Personal history of left wrist fracture 2 years ago. EXAM: LEFT WRIST - COMPLETE 3+ VIEW COMPARISON:  Left wrist radiographs 02/22/2013 FINDINGS: Moderate osteopenia is present. Previous fracture has healed. No definite acute fracture is evident. Degenerative changes extend to the DRUJ. IMPRESSION: 1. No acute abnormality. 2. Remote fractures have healed. 3. Moderate osteopenia. Electronically Signed   By: San Morelle M.D.   On: 03/06/2015 12:10   Ct Head Wo Contrast  03/06/2015  CLINICAL DATA:  Multiple falls last week. EXAM: CT HEAD WITHOUT CONTRAST TECHNIQUE: Contiguous axial images were obtained from the base of the skull through the vertex without intravenous contrast. COMPARISON:  None. FINDINGS:  Ventricles, cisterns and other CSF spaces are within normal. There is no mass, mass effect, shift of midline structures or acute hemorrhage. No evidence of acute infarction. Minimal degenerative change of the temporomandibular joints. Remaining bones and soft tissues are within normal. IMPRESSION: No acute intracranial findings. Electronically Signed   By: Marin Olp M.D.   On: 03/06/2015 16:53   Ct Angio Chest Pe W/cm &/or Wo Cm  03/09/2015  CLINICAL DATA:  Shortness of breath starting this morning. Recent shoulder fracture. EXAM: CT ANGIOGRAPHY CHEST WITH CONTRAST TECHNIQUE: Multidetector CT imaging of the chest was performed using the standard protocol during bolus administration of intravenous contrast. Multiplanar CT image reconstructions and MIPs were obtained to evaluate the vascular anatomy. CONTRAST:  67mL OMNIPAQUE IOHEXOL 350 MG/ML SOLN COMPARISON:  03/07/2015 shoulder CT. Chest radiograph 03/06/2015. Most recent chest CT of 01/17/2011. FINDINGS: Mediastinum/Nodes: The quality of this exam for evaluation of pulmonary embolism is poor to moderate. The bolus is suboptimally timed, with contrast centered in the SVC. This is in addition to degradation secondary to mild motion and patient body habitus. No evidence of pulmonary embolism to the lobar level within the upper lobes. No large segmental  embolism identified at the lung bases. Mild pulmonary artery enlargement, including a 2.9 cm left pulmonary artery. Moderate cardiomegaly with coronary artery atherosclerosis, including within likely left main and LAD. Lipomatous hypertrophy of the interatrial septum. No mediastinal or hilar adenopathy. Lungs/Pleura: Left greater than right trace pleural thickening. No pleural fluid. Mild motion degradation. Right base scarring. Upper abdomen: Normal imaged portions of the liver, spleen, stomach, pancreas, adrenal glands, kidneys. Musculoskeletal: Advanced left glenohumeral joint osteoarthritis. Proximal left  humerus fracture, suboptimally evaluated. Review of the MIP images confirms the above findings. IMPRESSION: 1. Suboptimal exam, secondary to bolus timing, patient size, motion artifact. No evidence of pulmonary embolism with limitations above. 2. Cardiomegaly with atherosclerosis in the coronary arteries. 3. Pulmonary artery enlargement suggests pulmonary arterial hypertension. Electronically Signed   By: Abigail Miyamoto M.D.   On: 03/09/2015 14:28   Mr Brain Wo Contrast  03/07/2015  CLINICAL DATA:  Gait abnormality.  Multiple recent falls. EXAM: MRI HEAD WITHOUT CONTRAST TECHNIQUE: Multiplanar, multiecho pulse sequences of the brain and surrounding structures were obtained without intravenous contrast. COMPARISON:  Head CT 03/06/2015 FINDINGS: Mild motion artifact. There is no evidence of acute infarct, intracranial hemorrhage, mass, midline shift, or extra-axial fluid collection. Mild cerebral atrophy is within normal limits for age. Small foci of T2 hyperintensity in the subcortical and deep cerebral white matter and pons are nonspecific but compatible with mild chronic small vessel ischemic disease. Orbits are unremarkable. Paranasal sinuses and mastoid air cells are clear. Major intracranial vascular flow voids are preserved. IMPRESSION: 1. No acute intracranial abnormality. 2. Mild chronic small vessel ischemic disease. Electronically Signed   By: Logan Bores M.D.   On: 03/07/2015 12:49   Ct Shoulder Left Wo Contrast  03/07/2015  CLINICAL DATA:  Recurrent falls with left shoulder pain. Question fracture. Initial encounter. EXAM: CT OF THE LEFT SHOULDER WITHOUT CONTRAST TECHNIQUE: Multidetector CT imaging was performed according to the standard protocol. Multiplanar CT image reconstructions were also generated. COMPARISON:  Plain films left shoulder 03/06/2015. FINDINGS: The patient has a fracture of the proximal humerus which shows mild lateral displacement of approximately 0.8 cm. The fracture extends  from the posterior, inferior margin of the humeral head in an anterior and inferior orientation through the surgical neck. Small cortical break is also seen in the posterior surgical neck. The fracture does not appear to involve the greater or lesser tuberosities. No other fracture is identified. The acromioclavicular joint is intact. There is severe glenohumeral osteoarthritis with marked joint space narrowing, osteophytosis and remodeling of the glenoid. Moderate acromioclavicular degenerative change is seen. Musculature about the shoulder girdle is somewhat atrophied. No rotator cuff tear is visualized on this exam. Imaged lung parenchyma is clear. IMPRESSION: Acute fracture of the proximal humerus shows mild lateral displacement but does not appear to involve the greater or lesser tuberosities. Severe glenohumeral osteoarthritis. Moderate acromioclavicular degenerative change. Electronically Signed   By: Inge Rise M.D.   On: 03/07/2015 11:31   Nm Pulmonary Perf And Vent  03/10/2015  CLINICAL DATA:  Shortness of breath and chest pain for 1 day EXAM: NUCLEAR MEDICINE VENTILATION - PERFUSION LUNG SCAN Views: Anterior, posterior, LPO, RPO, LAO, RAO -ventilation and perfusion Radionuclide: Technetium 21m DTPA -ventilation ; Technetium 87m macroaggregated albumin-perfusion Dose:  [id number] millicurie-ventilation; 4.3 millicurie-perfusion Route of administration: Inhalation -ventilation ; intravenous -perfusion COMPARISON:  Chest CT March 09, 2015 FINDINGS: Ventilation: Radiotracer uptake is homogeneous and symmetric bilaterally. No ventilation defects are appreciable. Perfusion: Radiotracer uptake is homogeneous is symmetric bilaterally. No  perfusion defects are appreciable. IMPRESSION: No appreciable ventilation or perfusion defects. This study constitutes a very low probability of pulmonary embolus. Electronically Signed   By: Lowella Grip III M.D.   On: 03/10/2015 10:11   Dg Shoulder  Left  03/06/2015  CLINICAL DATA:  Initial encounter for Pt tripped and fell this morning, she went down and hit both of her knees, then fell and landed on her left side. Known Left shoulder Fx from 2 weeks ago, and she has a previous left wrist Fx from 2014. She has bilateral knee p.*comment was truncated* EXAM: LEFT SHOULDER - 2+ VIEW COMPARISON:  None. FINDINGS: Osteopenia. Visualized portion of the left hemithorax is normal. Advanced glenohumeral joint osteoarthritis. Undersurface acromioclavicular joint degenerative change. High-riding humeral head consistent with chronic rotator cuff insufficiency. No acute fracture or dislocation. IMPRESSION: Advanced osteoarthritis.  No acute findings. Electronically Signed   By: Abigail Miyamoto M.D.   On: 03/06/2015 12:09   Dg Knee Complete 4 Views Left  03/06/2015  CLINICAL DATA:  Knee pain secondary to a fall this morning. EXAM: LEFT KNEE - COMPLETE 4+ VIEW COMPARISON:  None. FINDINGS: There is a nondisplaced fracture of the mid patella. There is a small joint effusion. The patient has had a hemiarthroplasty in the medial compartment. There are slight degenerative changes in the patellofemoral compartment and in the lateral compartment with chondromalacia in the lateral compartment. There is slight irregularity of the lateral tibial plateau beneath the prosthesis. IMPRESSION: Acute nondisplaced fracture of the mid patella. Electronically Signed   By: Lorriane Shire M.D.   On: 03/06/2015 12:09   Dg Knee Complete 4 Views Right  03/06/2015  CLINICAL DATA:  Initial encounter for Pt tripped and fell this morning, she went down and hit both of her knees, then fell and landed on her left side. Known Left shoulder Fx from 2 weeks ago, and she has a previous left wrist Fx from 2014. She has bilateral knee p.*comment was truncated* EXAM: RIGHT KNEE - COMPLETE 4+ VIEW COMPARISON:  None. FINDINGS: Knee arthroplasty. No acute hardware complication. No joint effusion. No acute  fracture or dislocation. IMPRESSION: Expected appearance after knee arthroplasty. Electronically Signed   By: Abigail Miyamoto M.D.   On: 03/06/2015 12:07   Dg Hips Bilat With Pelvis 3-4 Views  03/06/2015  CLINICAL DATA:  Fall.  Initial evaluation. EXAM: DG HIP (WITH OR WITHOUT PELVIS) 3-4V BILAT COMPARISON:  No prior exam for comparison. FINDINGS: Diffuse osteopenia degenerative change. No acute abnormality. No evidence of fracture dislocation. IMPRESSION: Diffuse osteopenia degenerative change.  No acute abnormality. Electronically Signed   By: Marcello Moores  Register   On: 03/06/2015 12:07    Micro Results     No results found for this or any previous visit (from the past 240 hour(s)).     Today   Subjective    Lisa Mcclure today has no headache,no chest abdominal pain,no new weakness tingling or numbness, feels much better wants to go home today.    Objective   Blood pressure 176/87, pulse 70, temperature 97.5 F (36.4 C), temperature source Oral, resp. rate 17, weight 97.886 kg (215 lb 12.8 oz), SpO2 95 %.   Intake/Output Summary (Last 24 hours) at 03/10/15 1034 Last data filed at 03/09/15 1700  Gross per 24 hour  Intake    720 ml  Output      0 ml  Net    720 ml    Exam Awake Alert, Oriented x 3, No new F.N deficits,  Normal affect [place].AT,PERRAL Supple Neck,No JVD, No cervical lymphadenopathy appriciated.  Symmetrical Chest wall movement, Good air movement bilaterally, CTAB RRR,No Gallops,Rubs or new Murmurs, No Parasternal Heave +ve B.Sounds, Abd Soft, Non tender, No organomegaly appriciated, No rebound -guarding or rigidity. No Cyanosis, Clubbing or edema, No new Rash or bruise, left knee in immobilizer, left shoulder in sling   Data Review   CBC w Diff:  Lab Results  Component Value Date   WBC 9.8 03/08/2015   HGB 11.9* 03/08/2015   HCT 37.3 03/08/2015   PLT 279 03/08/2015   LYMPHOPCT 21 03/06/2015   MONOPCT 6 03/06/2015   EOSPCT 3 03/06/2015   BASOPCT 0  03/06/2015    CMP:  Lab Results  Component Value Date   NA 136 03/08/2015   K 4.6 03/08/2015   CL 102 03/08/2015   CO2 27 03/08/2015   BUN 16 03/08/2015   CREATININE 0.68 03/08/2015   CREATININE 0.77 02/19/2015   PROT 7.3 03/06/2015   ALBUMIN 3.8 03/06/2015   BILITOT 0.6 03/06/2015   ALKPHOS 97 03/06/2015   AST 19 03/06/2015   ALT 12* 03/06/2015  .   Total Time in preparing paper work, data evaluation and todays exam - 35 minutes  Thurnell Lose M.D on 03/10/2015 at 10:34 AM  [facility]   Office  [phone]

## 2015-03-08 NOTE — Progress Notes (Signed)
Subjective: Patient reports a good night. Pain to left knee and shoulder. Pain also to left elbow and wrist. No new complications. Denies SOB, CP, or calf pain. Tolerating PO's.   Objective: Vital signs in last 24 hours: Temp:  [97.5 F (36.4 C)-98.4 F (36.9 C)] 97.7 F (36.5 C) (11/27 0616) Pulse Rate:  [63-76] 63 (11/27 0616) Resp:  [16-18] 16 (11/27 0616) BP: (143-151)/(57-76) 143/76 mmHg (11/27 0616) SpO2:  [95 %-99 %] 98 % (11/27 0616) Weight:  [98.476 kg (217 lb 1.6 oz)] 98.476 kg (217 lb 1.6 oz) (11/27 0719)  Intake/Output from previous day:   Intake/Output this shift:     Recent Labs  03/06/15 1254 03/07/15 0418 03/08/15 0516  HGB 13.2 11.0* 11.9*    Recent Labs  03/07/15 0418 03/08/15 0516  WBC 7.7 9.8  RBC 3.61* 3.90  HCT 34.5* 37.3  PLT 308 279    Recent Labs  03/07/15 0418 03/08/15 0516  NA 137 136  K 4.0 4.6  CL 103 102  CO2 29 27  BUN 16 16  CREATININE 0.90 0.68  GLUCOSE 121* 99  CALCIUM 8.4* 8.5*    Recent Labs  03/06/15 1254  INR 1.19    Alert and oriented x3. RRR, Lungs clear, BS x4. Left shoulder pain with gentle rom. Left shoulder lsing in place. LUE grossly n/v intact. Left Calf soft and non tender. L knee brace in place. No DVT signs. No signs of infection or compartment syndrome. LLE grossly neurovascularly intact. Left wrist and elbow no obvious deformity. Good ROM.    Assessment/Plan: Left non displaced patella fracture: Knee immobilizer and WBAT F/u Dr.Aluisio in office  Left Shoulder fractured an dOA: Continue sling which was adjusted Dr.Supple following  Left wrist pain: Will F/u with Dr.Gramig Possible stiffness from being in sling Gentle ROM as tolerated  Possible SNF placment      Lisa Mcclure L 03/08/2015, 9:49 AM

## 2015-03-09 ENCOUNTER — Ambulatory Visit: Payer: Medicare Other | Admitting: Physician Assistant

## 2015-03-09 ENCOUNTER — Encounter (HOSPITAL_COMMUNITY): Payer: Self-pay | Admitting: Radiology

## 2015-03-09 ENCOUNTER — Inpatient Hospital Stay (HOSPITAL_COMMUNITY): Payer: Medicare Other

## 2015-03-09 DIAGNOSIS — R0989 Other specified symptoms and signs involving the circulatory and respiratory systems: Secondary | ICD-10-CM

## 2015-03-09 DIAGNOSIS — R0602 Shortness of breath: Secondary | ICD-10-CM

## 2015-03-09 LAB — D-DIMER, QUANTITATIVE (NOT AT ARMC): D DIMER QUANT: 1.98 ug{FEU}/mL — AB (ref 0.00–0.50)

## 2015-03-09 MED ORDER — SODIUM CHLORIDE 0.9 % IV BOLUS (SEPSIS)
500.0000 mL | Freq: Once | INTRAVENOUS | Status: AC
  Administered 2015-03-09: 500 mL via INTRAVENOUS

## 2015-03-09 MED ORDER — LOSARTAN POTASSIUM 50 MG PO TABS: 50.0000 mg | ORAL_TABLET | Freq: Every day | ORAL | Status: DC

## 2015-03-09 MED ORDER — LOSARTAN POTASSIUM 50 MG PO TABS
50.0000 mg | ORAL_TABLET | Freq: Every day | ORAL | Status: DC
  Administered 2015-03-09: 50 mg via ORAL

## 2015-03-09 MED ORDER — MELOXICAM 15 MG PO TABS: 15.0000 mg | ORAL_TABLET | Freq: Every day | ORAL | Status: DC

## 2015-03-09 MED ORDER — HYDRALAZINE HCL 20 MG/ML IJ SOLN
10.0000 mg | Freq: Four times a day (QID) | INTRAMUSCULAR | Status: DC | PRN
  Administered 2015-03-09: 10 mg via INTRAVENOUS
  Filled 2015-03-09: qty 1

## 2015-03-09 MED ORDER — IOHEXOL 350 MG/ML SOLN
80.0000 mL | Freq: Once | INTRAVENOUS | Status: AC | PRN
  Administered 2015-03-09: 80 mL via INTRAVENOUS

## 2015-03-09 MED ORDER — CLONIDINE HCL 0.1 MG PO TABS
0.1000 mg | ORAL_TABLET | Freq: Two times a day (BID) | ORAL | Status: DC
  Administered 2015-03-09 – 2015-03-10 (×3): 0.1 mg via ORAL
  Filled 2015-03-09 (×3): qty 1

## 2015-03-09 NOTE — Progress Notes (Signed)
Physical Therapy Treatment Patient Details Name: Lisa Mcclure MRN: [id number] DOB: [birth year] Today's Date: 03/09/2015    History of Present Illness pt presents with multiple falls sustaining L "shoulder" fx 1 week ago and fell on Thanksgiving Day resulting in L Patella fx.  pt with hx of falls, bil wrist fxs, L rib fxs, R TKA, RA, THN, Depression, and OSA.      PT Comments    Patient progressing toward goals as evident by ability to ambulate 80 ft. Continue progressing patient as tolerated. Current plan of care remains appropriate and patient will benefit from further skilled PT for increased activity tolerance and maximized mobility level for decreased risk of falls.  Follow Up Recommendations  SNF     Equipment Recommendations  None recommended by PT    Recommendations for Other Services       Precautions / Restrictions Precautions Precautions: Fall Required Braces or Orthoses: Knee Immobilizer - Left;Sling (L arm sling) Knee Immobilizer - Left: On at all times Restrictions Weight Bearing Restrictions: Yes RUE Weight Bearing: Weight bearing as tolerated LUE Weight Bearing: Non weight bearing RLE Weight Bearing: Weight bearing as tolerated LLE Weight Bearing: Weight bearing as tolerated Other Position/Activity Restrictions: No orders for L UE, but maintained NWBing and in sling.     Mobility  Bed Mobility               General bed mobility comments: up in chair upon arrival  Transfers Overall transfer level: Needs assistance Equipment used: Hemi-walker Transfers: Sit to/from Stand (from recliner and commode) Sit to Stand: Min assist         General transfer comment: cues for hand placement and total A for hygiene after urinating  Ambulation/Gait Ambulation/Gait assistance: Min guard Ambulation Distance (Feet): 80 Feet Assistive device: Hemi-walker Gait Pattern/deviations: Step-through pattern;Decreased stride length;Wide base of support      General Gait Details: cues for sequencing of gait pattern with AD and to right eyes    Stairs            Wheelchair Mobility    Modified Rankin (Stroke Patients Only)       Balance Overall balance assessment: Needs assistance Sitting-balance support: No upper extremity supported Sitting balance-Leahy Scale: Good     Standing balance support: Single extremity supported Standing balance-Leahy Scale: Fair Standing balance comment: ability to stand for less than 30 seconds without UE support without activity                    Cognition Arousal/Alertness: Awake/alert Behavior During Therapy: WFL for tasks assessed/performed Overall Cognitive Status: Within Functional Limits for tasks assessed                      Exercises General Exercises - Lower Extremity Ankle Circles/Pumps: AROM;Both;20 reps Long Arc Quad: AROM;Right;15 reps Hip ABduction/ADduction: AROM;Both;15 reps Straight Leg Raises: AROM;Left;15 reps Hip Flexion/Marching: AROM;Right;15 reps    General Comments        Pertinent Vitals/Pain Pain Assessment: 0-10 Pain Score: 3  Pain Location: Lt shoulder and Lt knee cap Pain Descriptors / Indicators: Sore Pain Intervention(s): Limited activity within patient's tolerance;Monitored during session;Premedicated before session;Repositioned    Home Living                      Prior Function            PT Goals (current goals can now be found in the care plan section) Progress  towards PT goals: Progressing toward goals    Frequency  Min 3X/week    PT Plan Current plan remains appropriate    Co-evaluation             End of Session Equipment Utilized During Treatment: Gait belt;Left knee immobilizer Activity Tolerance: Patient tolerated treatment well Patient left: in chair;with call bell/phone within reach;with family/visitor present;with nursing/sitter in room     Time: 1433-1500 PT Time Calculation (min) (ACUTE  ONLY): 27 min  Charges:  $Gait Training: 8-22 mins $Therapeutic Exercise: 8-22 mins                    G CodesDarliss Cheney, PTA [phone] 03/09/2015, 3:21 PM

## 2015-03-09 NOTE — Clinical Social Work Note (Signed)
BSW intern has spoken with patient and patient's family at bedside to extend bed offers which have been given. Patient's family was not agreeable to any of the listed SNF facilities. However, patient's family has shown interest in patient d/c to [facility] or [facility] if possible. BSW intern to follow up with family once clinicals have been reviewed by facility.  BSW intern remains available if any further Social Work needs arises.  Tilda Samudio-BSW Intern [phone]

## 2015-03-09 NOTE — Care Management Important Message (Signed)
Important Message  Patient Details  Name: Semika Zou MRN: [id number] Date of Birth: September 17, 1932   Medicare Important Message Given:  Yes    Barb Merino Yuritza Paulhus 03/09/2015, 2:34 PM

## 2015-03-09 NOTE — Progress Notes (Signed)
OT Cancellation Note  Patient Details Name: Lisa Mcclure MRN: [id number] DOB: [birth year]   Cancelled Treatment:    Reason Eval/Treat Not Completed: Patient at procedure or test/ unavailable (pt in CT). Will check back for OT session as time allows.  Binnie Kand M.S., OTR/L Pager: [phone]  03/09/2015, 2:18 PM

## 2015-03-09 NOTE — Progress Notes (Signed)
PATIENT DETAILS Name: Lisa Mcclure Age: 81 y.o. Sex: female Date of Birth: 09/14/1932 Admit Date: 03/06/2015 Admitting Physician Elmarie Shiley, MD [id number], MD  Subjective:  Mild L.chest pain, no SOB, no weakness, no cough or fever  Assessment/Plan:  Left Patellar fracture: Secondary to a mechanical fall-Ortho consulted-recommendations are for knee immobilizer. Continue supportive care. PT eval completed-SNF recommended-Will consult social work  Active Problems: Recent left shoulder fracture: This was approximately one week prior to this admission-seen by Dr.Supple as an outpatient. Continue with arm sling-seen by orthopedics-CT scan of the left shoulder ordered. Follow.  Lower extremity edema: Mild edema present. Recently seen by cardiology-felt to have acute diastolic heart failure-and started on Lasix-unfortunately has history of urinary incontinence/nocturnal enuresis-and feels very difficult to take Lasix with all these issues. For now would continue with Lasix, check daily weights and see if we can use Lasix as needed if weight is increasing beyond her dry weight. Check UA to make sure no proteinuria. Discontinue amlodipine  Hypertension: Continue losartan, metoprolol and Lasix. Given lower extremity edema-will discontinue amlodipine. Follow-and chest accordingly  History of frequent falls: None of them have been secondary to syncopal episode. Apparently patient has had numerous falls over the past few years. Have asked patient to follow with PCP and minimize benzodiazepine/narcotics/sedatives, have also advised patient to remove loose carpeting-ensure adequate lighting around the house. Patient also instructed to follow with ophthalmologist as well. We will check orthostatics. Note- CT head/MRI brain negative for any acute abnormalities.  Hypothyroidism: Continue with levothyroxine  OSA (obstructive sleep apnea): Likely related to  obesity-continue CPAP daily at bedtime  Overactive bladder/nocturnal enuresis: Continue with desmopressin.  Mild L.Pl Chest pain - likely  Musculoskeletal, CTA inconclusive, check Venous US & D Dimer, EKG.    Disposition: Remain inpatient- SNF on discharge  Antimicrobial agents  See below  Anti-infectives    None      DVT Prophylaxis: Prophylactic Lovenox   Code Status: Full code  Family Communication Son at bedside  Procedures: None  CONSULTS:  orthopedic surgery  Time spent 30 minutes-Greater than 50% of this time was spent in counseling, explanation of diagnosis, planning of further management, and coordination of care.  MEDICATIONS: Scheduled Meds: . aspirin EC  81 mg Oral Daily  . busPIRone  7.5 mg Oral TID  . cloNIDine  0.1 mg Oral BID  . desmopressin  0.2 mg Oral Daily  . enoxaparin (LOVENOX) injection  40 mg Subcutaneous Q24H  . furosemide  40 mg Oral Daily  . levothyroxine  100 mcg Oral QAC breakfast  . losartan  50 mg Oral Daily  . meloxicam  15 mg Oral Daily  . metoprolol  50 mg Oral BID  . nortriptyline  25 mg Oral QHS  . pantoprazole  40 mg Oral Daily  . polyethylene glycol  17 g Oral Daily  . potassium chloride SA  20 mEq Oral Daily  . sodium chloride  500 mL Intravenous Once  . sodium phosphate  1 enema Rectal Once   Continuous Infusions: . sodium chloride Stopped (03/06/15 1500)   PRN Meds:.acetaminophen **OR** [DISCONTINUED] acetaminophen, LORazepam, morphine injection, ondansetron **OR** ondansetron (ZOFRAN) IV, oxyCODONE    PHYSICAL EXAM: Vital signs in last 24 hours: Filed Vitals:   03/08/15 2039 03/09/15 0445 03/09/15 0656 03/09/15 1257  BP: 103/59 177/79  153/71  Pulse: 79 69    Temp: 97.7 F (36.5 C) 98 F (  36.7 C)    TempSrc: Oral Oral    Resp: 16 16    Weight:   96.616 kg (213 lb)   SpO2: 96% 94%      Weight change:  Filed Weights   03/08/15 0719 03/09/15 0656  Weight: 98.476 kg (217 lb 1.6 oz) 96.616 kg (213  lb)   Body mass index is 37.74 kg/(m^2).   Gen Exam: Awake and alert with clear speech.  Neck: Supple, No JVD.   Chest: B/L Clear.   CVS: S1 S2 Regular, no murmurs.  Abdomen: soft, BS +, non tender, non distended.  Extremities: no edema, lower extremities warm to touch. Left arm in sling, left knee immobilizer in place Neurologic: Non Focal.   Skin: No Rash.   Wounds: N/A.   Intake/Output from previous day:  Intake/Output Summary (Last 24 hours) at 03/09/15 1503 Last data filed at 03/09/15 1300  Gross per 24 hour  Intake    600 ml  Output      0 ml  Net    600 ml     LAB RESULTS: CBC  Recent Labs Lab 03/06/15 1254 03/07/15 0418 03/08/15 0516  WBC 8.9 7.7 9.8  HGB 13.2 11.0* 11.9*  HCT 41.6 34.5* 37.3  PLT 351 308 279  MCV 94.8 95.6 95.6  MCH 30.1 30.5 30.5  MCHC 31.7 31.9 31.9  RDW 13.1 13.3 13.3  LYMPHSABS 1.8  --   --   MONOABS 0.6  --   --   EOSABS 0.3  --   --   BASOSABS 0.0  --   --     Chemistries   Recent Labs Lab 03/06/15 1254 03/07/15 0418 03/08/15 0516  NA 137 137 136  K 4.6 4.0 4.6  CL 100* 103 102  CO2 27 29 27   GLUCOSE 93 121* 99  BUN 18 16 16   CREATININE 0.91 0.90 0.68  CALCIUM 9.1 8.4* 8.5*    CBG: No results for input(s): GLUCAP in the last 168 hours.  GFR Estimated Creatinine Clearance: 62.1 mL/min (by C-G formula based on Cr of 0.68).  Coagulation profile  Recent Labs Lab 03/06/15 1254  INR 1.19    Cardiac Enzymes No results for input(s): CKMB, TROPONINI, MYOGLOBIN in the last 168 hours.  Invalid input(s): CK  Invalid input(s): POCBNP No results for input(s): DDIMER in the last 72 hours. No results for input(s): HGBA1C in the last 72 hours. No results for input(s): CHOL, HDL, LDLCALC, TRIG, CHOLHDL, LDLDIRECT in the last 72 hours. No results for input(s): TSH, T4TOTAL, T3FREE, THYROIDAB in the last 72 hours.  Invalid input(s): FREET3 No results for input(s): VITAMINB12, FOLATE, FERRITIN, TIBC, IRON, RETICCTPCT  in the last 72 hours. No results for input(s): LIPASE, AMYLASE in the last 72 hours.  Urine Studies No results for input(s): UHGB, CRYS in the last 72 hours.  Invalid input(s): UACOL, UAPR, USPG, UPH, UTP, UGL, UKET, UBIL, UNIT, UROB, ULEU, UEPI, UWBC, URBC, UBAC, CAST, UCOM, BILUA  MICROBIOLOGY: No results found for this or any previous visit (from the past 240 hour(s)).  RADIOLOGY STUDIES/RESULTS: Dg Chest 2 View  03/06/2015  CLINICAL DATA:  Fall. Recent shoulder fracture. Anterior lower chest wall pain. EXAM: CHEST  2 VIEW COMPARISON:  08/09/2010 chest radiograph. FINDINGS: Stable cardiomediastinal silhouette with top-normal heart size. No pneumothorax. No pleural effusion. No pulmonary edema. Mild curvilinear opacities are present at both lung bases. No displaced fracture in the visualized chest. Severe osteoarthritis in the bilateral glenohumeral joints. IMPRESSION: Mild curvilinear  opacities at both lung bases, favor scarring or atelectasis. Electronically Signed   By: Ilona Sorrel M.D.   On: 03/06/2015 12:09   Dg Wrist Complete Left  03/06/2015  CLINICAL DATA:  Tripped and fell today. Initial encounter. The patient fell to her knees. Burning and pain in the left wrist. Personal history of left wrist fracture 2 years ago. EXAM: LEFT WRIST - COMPLETE 3+ VIEW COMPARISON:  Left wrist radiographs 02/22/2013 FINDINGS: Moderate osteopenia is present. Previous fracture has healed. No definite acute fracture is evident. Degenerative changes extend to the DRUJ. IMPRESSION: 1. No acute abnormality. 2. Remote fractures have healed. 3. Moderate osteopenia. Electronically Signed   By: San Morelle M.D.   On: 03/06/2015 12:10   Ct Head Wo Contrast  03/06/2015  CLINICAL DATA:  Multiple falls last week. EXAM: CT HEAD WITHOUT CONTRAST TECHNIQUE: Contiguous axial images were obtained from the base of the skull through the vertex without intravenous contrast. COMPARISON:  None. FINDINGS: Ventricles,  cisterns and other CSF spaces are within normal. There is no mass, mass effect, shift of midline structures or acute hemorrhage. No evidence of acute infarction. Minimal degenerative change of the temporomandibular joints. Remaining bones and soft tissues are within normal. IMPRESSION: No acute intracranial findings. Electronically Signed   By: Marin Olp M.D.   On: 03/06/2015 16:53   Ct Angio Chest Pe W/cm &/or Wo Cm  03/09/2015  CLINICAL DATA:  Shortness of breath starting this morning. Recent shoulder fracture. EXAM: CT ANGIOGRAPHY CHEST WITH CONTRAST TECHNIQUE: Multidetector CT imaging of the chest was performed using the standard protocol during bolus administration of intravenous contrast. Multiplanar CT image reconstructions and MIPs were obtained to evaluate the vascular anatomy. CONTRAST:  54mL OMNIPAQUE IOHEXOL 350 MG/ML SOLN COMPARISON:  03/07/2015 shoulder CT. Chest radiograph 03/06/2015. Most recent chest CT of 01/17/2011. FINDINGS: Mediastinum/Nodes: The quality of this exam for evaluation of pulmonary embolism is poor to moderate. The bolus is suboptimally timed, with contrast centered in the SVC. This is in addition to degradation secondary to mild motion and patient body habitus. No evidence of pulmonary embolism to the lobar level within the upper lobes. No large segmental embolism identified at the lung bases. Mild pulmonary artery enlargement, including a 2.9 cm left pulmonary artery. Moderate cardiomegaly with coronary artery atherosclerosis, including within likely left main and LAD. Lipomatous hypertrophy of the interatrial septum. No mediastinal or hilar adenopathy. Lungs/Pleura: Left greater than right trace pleural thickening. No pleural fluid. Mild motion degradation. Right base scarring. Upper abdomen: Normal imaged portions of the liver, spleen, stomach, pancreas, adrenal glands, kidneys. Musculoskeletal: Advanced left glenohumeral joint osteoarthritis. Proximal left humerus  fracture, suboptimally evaluated. Review of the MIP images confirms the above findings. IMPRESSION: 1. Suboptimal exam, secondary to bolus timing, patient size, motion artifact. No evidence of pulmonary embolism with limitations above. 2. Cardiomegaly with atherosclerosis in the coronary arteries. 3. Pulmonary artery enlargement suggests pulmonary arterial hypertension. Electronically Signed   By: Abigail Miyamoto M.D.   On: 03/09/2015 14:28   Mr Brain Wo Contrast  03/07/2015  CLINICAL DATA:  Gait abnormality.  Multiple recent falls. EXAM: MRI HEAD WITHOUT CONTRAST TECHNIQUE: Multiplanar, multiecho pulse sequences of the brain and surrounding structures were obtained without intravenous contrast. COMPARISON:  Head CT 03/06/2015 FINDINGS: Mild motion artifact. There is no evidence of acute infarct, intracranial hemorrhage, mass, midline shift, or extra-axial fluid collection. Mild cerebral atrophy is within normal limits for age. Small foci of T2 hyperintensity in the subcortical and deep cerebral white  matter and pons are nonspecific but compatible with mild chronic small vessel ischemic disease. Orbits are unremarkable. Paranasal sinuses and mastoid air cells are clear. Major intracranial vascular flow voids are preserved. IMPRESSION: 1. No acute intracranial abnormality. 2. Mild chronic small vessel ischemic disease. Electronically Signed   By: Logan Bores M.D.   On: 03/07/2015 12:49   Ct Shoulder Left Wo Contrast  03/07/2015  CLINICAL DATA:  Recurrent falls with left shoulder pain. Question fracture. Initial encounter. EXAM: CT OF THE LEFT SHOULDER WITHOUT CONTRAST TECHNIQUE: Multidetector CT imaging was performed according to the standard protocol. Multiplanar CT image reconstructions were also generated. COMPARISON:  Plain films left shoulder 03/06/2015. FINDINGS: The patient has a fracture of the proximal humerus which shows mild lateral displacement of approximately 0.8 cm. The fracture extends from the  posterior, inferior margin of the humeral head in an anterior and inferior orientation through the surgical neck. Small cortical break is also seen in the posterior surgical neck. The fracture does not appear to involve the greater or lesser tuberosities. No other fracture is identified. The acromioclavicular joint is intact. There is severe glenohumeral osteoarthritis with marked joint space narrowing, osteophytosis and remodeling of the glenoid. Moderate acromioclavicular degenerative change is seen. Musculature about the shoulder girdle is somewhat atrophied. No rotator cuff tear is visualized on this exam. Imaged lung parenchyma is clear. IMPRESSION: Acute fracture of the proximal humerus shows mild lateral displacement but does not appear to involve the greater or lesser tuberosities. Severe glenohumeral osteoarthritis. Moderate acromioclavicular degenerative change. Electronically Signed   By: Inge Rise M.D.   On: 03/07/2015 11:31   Dg Shoulder Left  03/06/2015  CLINICAL DATA:  Initial encounter for Pt tripped and fell this morning, she went down and hit both of her knees, then fell and landed on her left side. Known Left shoulder Fx from 2 weeks ago, and she has a previous left wrist Fx from 2014. She has bilateral knee p.*comment was truncated* EXAM: LEFT SHOULDER - 2+ VIEW COMPARISON:  None. FINDINGS: Osteopenia. Visualized portion of the left hemithorax is normal. Advanced glenohumeral joint osteoarthritis. Undersurface acromioclavicular joint degenerative change. High-riding humeral head consistent with chronic rotator cuff insufficiency. No acute fracture or dislocation. IMPRESSION: Advanced osteoarthritis.  No acute findings. Electronically Signed   By: Abigail Miyamoto M.D.   On: 03/06/2015 12:09   Dg Knee Complete 4 Views Left  03/06/2015  CLINICAL DATA:  Knee pain secondary to a fall this morning. EXAM: LEFT KNEE - COMPLETE 4+ VIEW COMPARISON:  None. FINDINGS: There is a nondisplaced  fracture of the mid patella. There is a small joint effusion. The patient has had a hemiarthroplasty in the medial compartment. There are slight degenerative changes in the patellofemoral compartment and in the lateral compartment with chondromalacia in the lateral compartment. There is slight irregularity of the lateral tibial plateau beneath the prosthesis. IMPRESSION: Acute nondisplaced fracture of the mid patella. Electronically Signed   By: Lorriane Shire M.D.   On: 03/06/2015 12:09   Dg Knee Complete 4 Views Right  03/06/2015  CLINICAL DATA:  Initial encounter for Pt tripped and fell this morning, she went down and hit both of her knees, then fell and landed on her left side. Known Left shoulder Fx from 2 weeks ago, and she has a previous left wrist Fx from 2014. She has bilateral knee p.*comment was truncated* EXAM: RIGHT KNEE - COMPLETE 4+ VIEW COMPARISON:  None. FINDINGS: Knee arthroplasty. No acute hardware complication. No joint effusion.  No acute fracture or dislocation. IMPRESSION: Expected appearance after knee arthroplasty. Electronically Signed   By: Abigail Miyamoto M.D.   On: 03/06/2015 12:07   Dg Hips Bilat With Pelvis 3-4 Views  03/06/2015  CLINICAL DATA:  Fall.  Initial evaluation. EXAM: DG HIP (WITH OR WITHOUT PELVIS) 3-4V BILAT COMPARISON:  No prior exam for comparison. FINDINGS: Diffuse osteopenia degenerative change. No acute abnormality. No evidence of fracture dislocation. IMPRESSION: Diffuse osteopenia degenerative change.  No acute abnormality. Electronically Signed   By: Marcello Moores  Register   On: 03/06/2015 12:07    Thurnell Lose, MD  [facility] Pager:[phone]  If 7PM-7AM, please contact night-coverage [email or web address] Password [email or web address] 03/09/2015, 3:03 PM   LOS: 3 days

## 2015-03-09 NOTE — Progress Notes (Signed)
Preliminary results by tech - Bilateral Lower venous duplex completed. No evidence of deep or superficial vein thrombosis in the lower extremities.  Oda Cogan, BS, RDMS, RVT

## 2015-03-09 NOTE — Clinical Social Work Note (Signed)
Discharge planning deferred. CT results pending.   CSW remains available as needed.   Glendon Axe, MSW, LCSWA [phone] 03/09/2015 4:40 PM

## 2015-03-09 NOTE — Progress Notes (Signed)
Pt refused CPAP

## 2015-03-10 ENCOUNTER — Inpatient Hospital Stay (HOSPITAL_COMMUNITY): Payer: Medicare Other

## 2015-03-10 DIAGNOSIS — K219 Gastro-esophageal reflux disease without esophagitis: Secondary | ICD-10-CM | POA: Diagnosis not present

## 2015-03-10 DIAGNOSIS — M6281 Muscle weakness (generalized): Secondary | ICD-10-CM | POA: Diagnosis not present

## 2015-03-10 DIAGNOSIS — R262 Difficulty in walking, not elsewhere classified: Secondary | ICD-10-CM | POA: Diagnosis not present

## 2015-03-10 DIAGNOSIS — I509 Heart failure, unspecified: Secondary | ICD-10-CM | POA: Diagnosis not present

## 2015-03-10 DIAGNOSIS — E039 Hypothyroidism, unspecified: Secondary | ICD-10-CM | POA: Diagnosis not present

## 2015-03-10 DIAGNOSIS — R278 Other lack of coordination: Secondary | ICD-10-CM | POA: Diagnosis not present

## 2015-03-10 DIAGNOSIS — R259 Unspecified abnormal involuntary movements: Secondary | ICD-10-CM | POA: Diagnosis not present

## 2015-03-10 DIAGNOSIS — G4700 Insomnia, unspecified: Secondary | ICD-10-CM | POA: Diagnosis not present

## 2015-03-10 DIAGNOSIS — I10 Essential (primary) hypertension: Secondary | ICD-10-CM | POA: Diagnosis not present

## 2015-03-10 DIAGNOSIS — S82001S Unspecified fracture of right patella, sequela: Secondary | ICD-10-CM | POA: Diagnosis not present

## 2015-03-10 DIAGNOSIS — Z4789 Encounter for other orthopedic aftercare: Secondary | ICD-10-CM | POA: Diagnosis not present

## 2015-03-10 DIAGNOSIS — R600 Localized edema: Secondary | ICD-10-CM | POA: Diagnosis not present

## 2015-03-10 DIAGNOSIS — G894 Chronic pain syndrome: Secondary | ICD-10-CM | POA: Diagnosis not present

## 2015-03-10 DIAGNOSIS — F411 Generalized anxiety disorder: Secondary | ICD-10-CM | POA: Diagnosis not present

## 2015-03-10 DIAGNOSIS — E890 Postprocedural hypothyroidism: Secondary | ICD-10-CM | POA: Diagnosis not present

## 2015-03-10 DIAGNOSIS — K5900 Constipation, unspecified: Secondary | ICD-10-CM | POA: Diagnosis not present

## 2015-03-10 DIAGNOSIS — R0609 Other forms of dyspnea: Secondary | ICD-10-CM

## 2015-03-10 DIAGNOSIS — F4321 Adjustment disorder with depressed mood: Secondary | ICD-10-CM | POA: Diagnosis not present

## 2015-03-10 DIAGNOSIS — S82092A Other fracture of left patella, initial encounter for closed fracture: Secondary | ICD-10-CM | POA: Diagnosis not present

## 2015-03-10 DIAGNOSIS — S42295D Other nondisplaced fracture of upper end of left humerus, subsequent encounter for fracture with routine healing: Secondary | ICD-10-CM | POA: Diagnosis not present

## 2015-03-10 DIAGNOSIS — F329 Major depressive disorder, single episode, unspecified: Secondary | ICD-10-CM | POA: Diagnosis not present

## 2015-03-10 DIAGNOSIS — R2681 Unsteadiness on feet: Secondary | ICD-10-CM | POA: Diagnosis not present

## 2015-03-10 DIAGNOSIS — Z9181 History of falling: Secondary | ICD-10-CM | POA: Diagnosis not present

## 2015-03-10 DIAGNOSIS — W19XXXD Unspecified fall, subsequent encounter: Secondary | ICD-10-CM

## 2015-03-10 DIAGNOSIS — R079 Chest pain, unspecified: Secondary | ICD-10-CM | POA: Diagnosis not present

## 2015-03-10 DIAGNOSIS — R0602 Shortness of breath: Secondary | ICD-10-CM | POA: Diagnosis not present

## 2015-03-10 DIAGNOSIS — M8580 Other specified disorders of bone density and structure, unspecified site: Secondary | ICD-10-CM | POA: Diagnosis not present

## 2015-03-10 DIAGNOSIS — S82092D Other fracture of left patella, subsequent encounter for closed fracture with routine healing: Secondary | ICD-10-CM | POA: Diagnosis not present

## 2015-03-10 DIAGNOSIS — F418 Other specified anxiety disorders: Secondary | ICD-10-CM | POA: Diagnosis not present

## 2015-03-10 DIAGNOSIS — R32 Unspecified urinary incontinence: Secondary | ICD-10-CM | POA: Diagnosis not present

## 2015-03-10 DIAGNOSIS — I5030 Unspecified diastolic (congestive) heart failure: Secondary | ICD-10-CM | POA: Diagnosis not present

## 2015-03-10 DIAGNOSIS — S42302S Unspecified fracture of shaft of humerus, left arm, sequela: Secondary | ICD-10-CM | POA: Diagnosis not present

## 2015-03-10 MED ORDER — TECHNETIUM TO 99M ALBUMIN AGGREGATED
4.3000 | Freq: Once | INTRAVENOUS | Status: AC | PRN
  Administered 2015-03-10: 4 via INTRAVENOUS

## 2015-03-10 MED ORDER — TECHNETIUM TC 99M DIETHYLENETRIAME-PENTAACETIC ACID: 31.6000 | Freq: Once | INTRAVENOUS | Status: DC | PRN
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
## 2015-03-10 NOTE — Progress Notes (Signed)
Pt ready for d/c per MD. Report called to Hoyle Sauer at [facility], all questions answered. Belongings sent with son, pt transported to facility via [facility].  Adeline, Jerry Caras

## 2015-03-10 NOTE — Clinical Social Work Note (Addendum)
[facility] liaison to meet with patient and pt's son at bedside to complete admissions paperwork.   Clinical Social Worker facilitated patient discharge including contacting patient family and facility to confirm patient discharge plans.  Clinical information faxed to facility and family agreeable with plan.  CSW arranged ambulance transport via [facility] to [facility].  RN to call report prior to discharge.  Clinical Social Worker will sign off for now as social work intervention is no longer needed. Please consult [place] again if new need arises.  Glendon Axe, MSW, LCSWA [phone] 03/10/2015 12:00 PM

## 2015-03-10 NOTE — Clinical Social Work Placement (Signed)
   CLINICAL SOCIAL WORK PLACEMENT  NOTE  Date:  03/10/2015  Patient Details  Name: Lisa Mcclure MRN: [id number] Date of Birth: Aug 06, 1932  Clinical Social Work is seeking post-discharge placement for this patient at the [place] level of care (*CSW will initial, date and re-position this form in  chart as items are completed):  Yes   Patient/family provided with [place] Work Department's list of facilities offering this level of care within the geographic area requested by the patient (or if unable, by the patient's family).  Yes   Patient/family informed of their freedom to choose among providers that offer the needed level of care, that participate in Medicare, Medicaid or managed care program needed by the patient, have an available bed and are willing to accept the patient.  Yes   Patient/family informed of [facility]'s ownership interest in [facility] and [facility], as well as of the fact that they are under no obligation to receive care at these facilities.  PASRR submitted to EDS on 03/08/15     PASRR number received on 03/08/15     Existing PASRR number confirmed on       FL2 transmitted to all facilities in geographic area requested by pt/family on 03/08/15     FL2 transmitted to all facilities within larger geographic area on       Patient informed that his/her managed care company has contracts with or will negotiate with certain facilities, including the following:        Yes   Patient/family informed of bed offers received.  Patient chooses bed at  [facility] and [place] )     Physician recommends and patient chooses bed at      Patient to be transferred to  [facility] and [place] ) on 03/10/15.  Patient to be transferred to facility by  [facility] )     Patient family notified on 03/10/15 of transfer.  Name of family member notified:   (Pt's dtr, Lujean Rave )     PHYSICIAN       Additional  Comment:    _______________________________________________ Glendon Axe, MSW, [place] [phone] 03/10/2015 12:00 PM

## 2015-03-10 NOTE — Clinical Social Work Placement (Signed)
   CLINICAL SOCIAL WORK PLACEMENT  NOTE  Date:  03/10/2015  Patient Details  Name: Lisa Mcclure MRN: [id number] Date of Birth: 13-Oct-1932  Clinical Social Work is seeking post-discharge placement for this patient at the [place] level of care (*CSW will initial, date and re-position this form in  chart as items are completed):  Yes   Patient/family provided with [place] Work Department's list of facilities offering this level of care within the geographic area requested by the patient (or if unable, by the patient's family).  Yes   Patient/family informed of their freedom to choose among providers that offer the needed level of care, that participate in Medicare, Medicaid or managed care program needed by the patient, have an available bed and are willing to accept the patient.  Yes   Patient/family informed of [place] [facility]'s ownership interest in [facility] and [facility], as well as of the fact that they are under no obligation to receive care at these facilities.  PASRR submitted to EDS on 03/08/15     PASRR number received on 03/08/15     Existing PASRR number confirmed on       FL2 transmitted to all facilities in geographic area requested by pt/family on 03/08/15     FL2 transmitted to all facilities within larger geographic area on       Patient informed that his/her managed care company has contracts with or will negotiate with certain facilities, including the following:        Yes   Patient/family informed of bed offers received.  Patient chooses bed at  [facility] and [place] )     Physician recommends and patient chooses bed at      Patient to be transferred to  [facility] and [place] ) on 03/10/15.  Patient to be transferred to facility by  [facility] )     Patient family notified on 03/10/15 of transfer.  Name of family member notified:   (Pt's dtr, Lujean Rave and pt's son at bedside. )      PHYSICIAN       Additional Comment:    _______________________________________________ Rozell Searing, LCSW 03/10/2015, 1:59 PM

## 2015-03-12 ENCOUNTER — Encounter: Payer: Self-pay | Admitting: Internal Medicine

## 2015-03-12 ENCOUNTER — Non-Acute Institutional Stay (SKILLED_NURSING_FACILITY): Payer: Medicare Other | Admitting: Internal Medicine

## 2015-03-12 ENCOUNTER — Encounter: Payer: Self-pay | Admitting: Physician Assistant

## 2015-03-12 DIAGNOSIS — F329 Major depressive disorder, single episode, unspecified: Secondary | ICD-10-CM | POA: Diagnosis not present

## 2015-03-12 DIAGNOSIS — K219 Gastro-esophageal reflux disease without esophagitis: Secondary | ICD-10-CM | POA: Diagnosis not present

## 2015-03-12 DIAGNOSIS — S42302S Unspecified fracture of shaft of humerus, left arm, sequela: Secondary | ICD-10-CM

## 2015-03-12 DIAGNOSIS — F4321 Adjustment disorder with depressed mood: Secondary | ICD-10-CM | POA: Diagnosis not present

## 2015-03-12 DIAGNOSIS — I509 Heart failure, unspecified: Secondary | ICD-10-CM | POA: Diagnosis not present

## 2015-03-12 DIAGNOSIS — E890 Postprocedural hypothyroidism: Secondary | ICD-10-CM

## 2015-03-12 DIAGNOSIS — I10 Essential (primary) hypertension: Secondary | ICD-10-CM | POA: Diagnosis not present

## 2015-03-12 DIAGNOSIS — S82001S Unspecified fracture of right patella, sequela: Secondary | ICD-10-CM | POA: Diagnosis not present

## 2015-03-12 DIAGNOSIS — K5900 Constipation, unspecified: Secondary | ICD-10-CM | POA: Diagnosis not present

## 2015-03-12 DIAGNOSIS — G4700 Insomnia, unspecified: Secondary | ICD-10-CM

## 2015-03-12 DIAGNOSIS — E876 Hypokalemia: Secondary | ICD-10-CM

## 2015-03-12 DIAGNOSIS — R32 Unspecified urinary incontinence: Secondary | ICD-10-CM

## 2015-03-12 DIAGNOSIS — R2681 Unsteadiness on feet: Secondary | ICD-10-CM | POA: Diagnosis not present

## 2015-03-12 DIAGNOSIS — Z634 Disappearance and death of family member: Secondary | ICD-10-CM

## 2015-03-12 NOTE — Progress Notes (Signed)
This encounter was created in error - please disregard.

## 2015-03-12 NOTE — Progress Notes (Signed)
[name], female   DOB: October 04, 1932, 81 y.o.   MRN: [id number]       PCP: [name], MD  Code Status: full code  No Known Allergies  Chief Complaint  Patient presents with  . New Admit To SNF     HPI:  81 y.o. patient is here for short term rehabilitation post hospital admission from 03/06/15-03/10/15 post fall with left patella closed fracture. She was seen by orthopedics and supportive care with knee immobilizer recommneded. She also had fractured her left shoulder recently and at present her left arm is in a sling. Her pain is under control with current regimen. She worked well with therapy team today. She has ongoing lower extremity edema and has been seen by cardiology and thought to have acute diastolic chf. She is currently on lasix. She has PMH of HTN, OSA, hypothyroidism, frequent falls and urinary incontinence among others. She has 2 orders for levothyroxine on discharge and patient mentions only taking 100 mcg at home. She also mentions taking 2 tablet of desmopressin at home. She recently lost her son. This and her ongoing health issues are making her anxious. She gets tearful during this visit. She has trouble falling asleep at night  Review of Systems:  Constitutional: Negative for fever, chills, diaphoresis.  HENT: Negative for headache, congestion, nasal discharge, difficulty swallowing.   Eyes: Negative for eye pain, blurred vision, double vision and discharge.  Respiratory: Negative for cough, shortness of breath and wheezing.   Cardiovascular: Negative for chest pain, palpitations. Positive for leg swelling.  Gastrointestinal: Negative for heartburn, nausea, vomiting, abdominal pain. Has not had bowel movement for 4 days Genitourinary: Negative for dysuria, flank pain. has urinary incontinence Musculoskeletal: Negative for back pain, falls in the facility. Skin: Negative for itching, rash.  Neurological: Negative for dizziness, tingling,  focal weakness Psychiatric/Behavioral: positive for anxiety   Past Medical History  Diagnosis Date  . Rheumatoid arthritis(714.0)   . Unspecified essential hypertension   . Esophageal reflux   . Hypothyroidism   . Diverticulosis   . Insomnia   . OSA (obstructive sleep apnea)   . OAB (overactive bladder)   . LBP (low back pain)   . DOE (dyspnea on exertion)   . Shoulder pain, bilateral   . GERD (gastroesophageal reflux disease)   . OAB (overactive bladder)   . Depression   . Venous insufficiency   . Cardiomegaly   . Osteoarthritis    Past Surgical History  Procedure Laterality Date  . Appendectomy  1953  . Vesicovaginal fistula closure w/ tah    . Breast biopsy Right 1980  . Knee arthroscopy w/ autogenous cartilage implantation (aci) procedure Left 1994, 1995  . Knee arthroscopy Right 1996, 2010  . Shoulder surgery  1990  . Cystocele repair    . Cesarean section      1957, 1961, 1964  . Wrist surgery  1967  . Total abdominal hysterectomy  1972  . Bladder abduction-1996  1996  . Cosmetic surgery  1996   Social History:   reports that she quit smoking about 30 years ago. Her smoking use included Cigarettes. She quit after 10 years of use. She does not have any smokeless tobacco history on file. She reports that she drinks alcohol. She reports that she does not use illicit drugs.  Family History  Problem Relation Age of Onset  . COPD Father   . Lung cancer Mother   . Breast cancer Maternal Aunt  Medications:   Medication List       This list is accurate as of: 03/12/15 12:37 PM.  Always use your most recent med list.               aspirin 81 MG tablet  Take 81 mg by mouth daily.     busPIRone 7.5 MG tablet  Commonly known as:  BUSPAR  Take 7.5 mg by mouth 3 (three) times daily.     cholecalciferol 1000 UNITS tablet  Commonly known as:  VITAMIN D  Take 1,000 Units by mouth daily.     desmopressin 0.2 MG tablet  Commonly known as:  DDAVP  Take 0.4  mg by mouth at bedtime.     furosemide 40 MG tablet  Commonly known as:  LASIX  Take 2 tablets by mouth at 8 am (80 mg), and then take 1 tablet by mouth at 2 pm (40 mg) every day     HYDROcodone-acetaminophen 7.5-325 MG tablet  Commonly known as:  NORCO  Take 1 tablet by mouth every 6 (six) hours as needed for moderate pain.     levothyroxine 100 MCG tablet  Commonly known as:  SYNTHROID, LEVOTHROID  Take 100 mcg by mouth daily before breakfast.     losartan 100 MG tablet  Commonly known as:  COZAAR  Take 100 mg by mouth daily.     metoprolol 50 MG tablet  Commonly known as:  LOPRESSOR  Take 50 mg by mouth 2 (two) times daily.     MULTI-DAY VITAMINS PO  Take by mouth.     nortriptyline 25 MG capsule  Commonly known as:  PAMELOR  Take 25 mg by mouth at bedtime.     omeprazole 20 MG capsule  Commonly known as:  PRILOSEC  Take 20 mg by mouth 2 (two) times daily before a meal.     polyethylene glycol packet  Commonly known as:  MIRALAX / GLYCOLAX  Take 17 g by mouth daily.     potassium chloride SA 20 MEQ tablet  Commonly known as:  K-DUR,KLOR-CON  Take 1 tablet (20 mEq total) by mouth daily.         Physical Exam: Filed Vitals:   03/12/15 1108  BP: 167/94  Pulse: 70  Temp: 97 F (36.1 C)  Resp: 18  SpO2: 95%    General- elderly female, overweight, in no acute distress Head- normocephalic, atraumatic Nose- normal nasal mucosa, no maxillary or frontal sinus tenderness, no nasal discharge Throat- moist mucus membrane  Eyes- PERRLA, EOMI, no pallor, no icterus, no discharge, normal conjunctiva, normal sclera Neck- no cervical lymphadenopathy Cardiovascular- normal s1,s2, no murmurs, palpable dorsalis pedis and radial pulses, trace right and 1 + left leg edema Respiratory- bilateral clear to auscultation, no wheeze, no rhonchi, no crackles, no use of accessory muscles Abdomen- bowel sounds present, soft, non tender Musculoskeletal-generalized weakness, left  knee in immobilizer, using walker, left arm in sling, can move her digits Neurological- no focal deficit, alert and oriented to person, place and time Skin- warm and dry Psychiatry- tearful this visit   Labs reviewed: Basic Metabolic Panel:  Recent Labs  03/06/15 1254 03/07/15 0418 03/08/15 0516  NA 137 137 136  K 4.6 4.0 4.6  CL 100* 103 102  CO2 27 29 27   GLUCOSE 93 121* 99  BUN 18 16 16   CREATININE 0.91 0.90 0.68  CALCIUM 9.1 8.4* 8.5*   Liver Function Tests:  Recent Labs  02/19/15 1057 03/06/15 1254  AST 14 19  ALT 10 12*  ALKPHOS 89 97  BILITOT 0.6 0.6  PROT 7.2 7.3  ALBUMIN 4.2 3.8   No results for input(s): LIPASE, AMYLASE in the last 8760 hours. No results for input(s): AMMONIA in the last 8760 hours. CBC:  Recent Labs  03/06/15 1254 03/07/15 0418 03/08/15 0516  WBC 8.9 7.7 9.8  NEUTROABS 6.2  --   --   HGB 13.2 11.0* 11.9*  HCT 41.6 34.5* 37.3  MCV 94.8 95.6 95.6  PLT 351 308 279    Radiological Exams: Dg Chest 2 View  03/06/2015  CLINICAL DATA:  Fall. Recent shoulder fracture. Anterior lower chest wall pain. EXAM: CHEST  2 VIEW COMPARISON:  08/09/2010 chest radiograph. FINDINGS: Stable cardiomediastinal silhouette with top-normal heart size. No pneumothorax. No pleural effusion. No pulmonary edema. Mild curvilinear opacities are present at both lung bases. No displaced fracture in the visualized chest. Severe osteoarthritis in the bilateral glenohumeral joints. IMPRESSION: Mild curvilinear opacities at both lung bases, favor scarring or atelectasis. Electronically Signed   By: Ilona Sorrel M.D.   On: 03/06/2015 12:09   Dg Wrist Complete Left  03/06/2015  CLINICAL DATA:  Tripped and fell today. Initial encounter. The patient fell to her knees. Burning and pain in the left wrist. Personal history of left wrist fracture 2 years ago. EXAM: LEFT WRIST - COMPLETE 3+ VIEW COMPARISON:  Left wrist radiographs 02/22/2013 FINDINGS: Moderate osteopenia is  present. Previous fracture has healed. No definite acute fracture is evident. Degenerative changes extend to the DRUJ. IMPRESSION: 1. No acute abnormality. 2. Remote fractures have healed. 3. Moderate osteopenia. Electronically Signed   By: San Morelle M.D.   On: 03/06/2015 12:10   Ct Head Wo Contrast  03/06/2015  CLINICAL DATA:  Multiple falls last week. EXAM: CT HEAD WITHOUT CONTRAST TECHNIQUE: Contiguous axial images were obtained from the base of the skull through the vertex without intravenous contrast. COMPARISON:  None. FINDINGS: Ventricles, cisterns and other CSF spaces are within normal. There is no mass, mass effect, shift of midline structures or acute hemorrhage. No evidence of acute infarction. Minimal degenerative change of the temporomandibular joints. Remaining bones and soft tissues are within normal. IMPRESSION: No acute intracranial findings. Electronically Signed   By: Marin Olp M.D.   On: 03/06/2015 16:53   Mr Brain Wo Contrast  03/07/2015  CLINICAL DATA:  Gait abnormality.  Multiple recent falls. EXAM: MRI HEAD WITHOUT CONTRAST TECHNIQUE: Multiplanar, multiecho pulse sequences of the brain and surrounding structures were obtained without intravenous contrast. COMPARISON:  Head CT 03/06/2015 FINDINGS: Mild motion artifact. There is no evidence of acute infarct, intracranial hemorrhage, mass, midline shift, or extra-axial fluid collection. Mild cerebral atrophy is within normal limits for age. Small foci of T2 hyperintensity in the subcortical and deep cerebral white matter and pons are nonspecific but compatible with mild chronic small vessel ischemic disease. Orbits are unremarkable. Paranasal sinuses and mastoid air cells are clear. Major intracranial vascular flow voids are preserved. IMPRESSION: 1. No acute intracranial abnormality. 2. Mild chronic small vessel ischemic disease. Electronically Signed   By: Logan Bores M.D.   On: 03/07/2015 12:49   Ct Shoulder Left Wo  Contrast  03/07/2015  CLINICAL DATA:  Recurrent falls with left shoulder pain. Question fracture. Initial encounter. EXAM: CT OF THE LEFT SHOULDER WITHOUT CONTRAST TECHNIQUE: Multidetector CT imaging was performed according to the standard protocol. Multiplanar CT image reconstructions were also generated. COMPARISON:  Plain films left shoulder 03/06/2015. FINDINGS: The patient  has a fracture of the proximal humerus which shows mild lateral displacement of approximately 0.8 cm. The fracture extends from the posterior, inferior margin of the humeral head in an anterior and inferior orientation through the surgical neck. Small cortical break is also seen in the posterior surgical neck. The fracture does not appear to involve the greater or lesser tuberosities. No other fracture is identified. The acromioclavicular joint is intact. There is severe glenohumeral osteoarthritis with marked joint space narrowing, osteophytosis and remodeling of the glenoid. Moderate acromioclavicular degenerative change is seen. Musculature about the shoulder girdle is somewhat atrophied. No rotator cuff tear is visualized on this exam. Imaged lung parenchyma is clear. IMPRESSION: Acute fracture of the proximal humerus shows mild lateral displacement but does not appear to involve the greater or lesser tuberosities. Severe glenohumeral osteoarthritis. Moderate acromioclavicular degenerative change. Electronically Signed   By: Inge Rise M.D.   On: 03/07/2015 11:31   Dg Shoulder Left  03/06/2015  CLINICAL DATA:  Initial encounter for Pt tripped and fell this morning, she went down and hit both of her knees, then fell and landed on her left side. Known Left shoulder Fx from 2 weeks ago, and she has a previous left wrist Fx from 2014. She has bilateral knee p.*comment was truncated* EXAM: LEFT SHOULDER - 2+ VIEW COMPARISON:  None. FINDINGS: Osteopenia. Visualized portion of the left hemithorax is normal. Advanced glenohumeral  joint osteoarthritis. Undersurface acromioclavicular joint degenerative change. High-riding humeral head consistent with chronic rotator cuff insufficiency. No acute fracture or dislocation. IMPRESSION: Advanced osteoarthritis.  No acute findings. Electronically Signed   By: Abigail Miyamoto M.D.   On: 03/06/2015 12:09   Dg Knee Complete 4 Views Left  03/06/2015  CLINICAL DATA:  Knee pain secondary to a fall this morning. EXAM: LEFT KNEE - COMPLETE 4+ VIEW COMPARISON:  None. FINDINGS: There is a nondisplaced fracture of the mid patella. There is a small joint effusion. The patient has had a hemiarthroplasty in the medial compartment. There are slight degenerative changes in the patellofemoral compartment and in the lateral compartment with chondromalacia in the lateral compartment. There is slight irregularity of the lateral tibial plateau beneath the prosthesis. IMPRESSION: Acute nondisplaced fracture of the mid patella. Electronically Signed   By: Lorriane Shire M.D.   On: 03/06/2015 12:09   Dg Knee Complete 4 Views Right  03/06/2015  CLINICAL DATA:  Initial encounter for Pt tripped and fell this morning, she went down and hit both of her knees, then fell and landed on her left side. Known Left shoulder Fx from 2 weeks ago, and she has a previous left wrist Fx from 2014. She has bilateral knee p.*comment was truncated* EXAM: RIGHT KNEE - COMPLETE 4+ VIEW COMPARISON:  None. FINDINGS: Knee arthroplasty. No acute hardware complication. No joint effusion. No acute fracture or dislocation. IMPRESSION: Expected appearance after knee arthroplasty. Electronically Signed   By: Abigail Miyamoto M.D.   On: 03/06/2015 12:07   Dg Hips Bilat With Pelvis 3-4 Views  03/06/2015  CLINICAL DATA:  Fall.  Initial evaluation. EXAM: DG HIP (WITH OR WITHOUT PELVIS) 3-4V BILAT COMPARISON:  No prior exam for comparison. FINDINGS: Diffuse osteopenia degenerative change. No acute abnormality. No evidence of fracture dislocation.  IMPRESSION: Diffuse osteopenia degenerative change.  No acute abnormality. Electronically Signed   By: Marcello Moores  Register   On: 03/06/2015 12:07    Assessment/Plan  Unsteady gait With frequent falls. Will have her work with physical therapy and occupational therapy team to help  with gait training and muscle strengthening exercises.fall precautions. Skin care. Encourage to be out of bed. Gel overaly mattress to prevent bedsores given her decreased mobility in bed.  Left patella fracture To wear her immobilizer all the time. Will have patient work with PT/OT as tolerated to regain strength and restore function.  Fall precautions are in place. Continue norco 7.5-325 mg 1 tab q6h prn pain with tylenol 650 mg q4h prn pain. Has follow up with orthopedics. Continue her baby aspirin  Left humerus fracture Has sling in place. To follow with orthopedics. Continue vitamin d supplement. On prn pain medication  Uncontrolled HTN On cozaar 100 mg daily, lopressor 50 mg bid. With elevated BP, add hydralazine 10 mg bid and reassess. Check BP q shift x 1 week  Constipation On miralax daily and colace 100 mg daily. Change miralax to bid for now and start senna s 2 tab qhs. D/c colace  Insomnia Start melatonin 5 mg qhs and monitor  Diastolic chf Continue cozaar 100 mg daily, lopressor 50 mg bid. Currently on lasix 80 mg in am and 40 mg in pm. Patient mentions being on only 40 mg daily at home. Has some edema present. Change her lasix to 40 mg bid for now. Continue kcl. Check bmp 03/16/15  Hypokalemia On lasix. Continue kcl 20 meq daily, check bmp  UI Continue desmopressin but change to 0.2 mg 2 tab qhs  Chronic depression Continue pamelor 25 mg daily and buspirone 7.5 mg tid. Get psychiatry and psychology consult.  Grief period Get psychology consult. Encouraged to be in group activities  gerd Currently symptom controlled. Continue prilosec 20 mg bid  Hypothyroidism Change her levothyroxine order  to 100 mcg po daily. Lab Results  Component Value Date   TSH 4.847* 02/19/2015     Goals of care: short term rehabilitation   Labs/tests ordered: cbc, cmp 03/16/15, daily weight and BP check for now  Family/ staff Communication: reviewed care plan with patient and nursing supervisor    Blanchie Serve, MD  [facility] [phone] (Monday-Friday 8 am - 5 pm) [phone] (afterhours)

## 2015-03-16 ENCOUNTER — Non-Acute Institutional Stay (SKILLED_NURSING_FACILITY): Payer: Medicare Other | Admitting: Nurse Practitioner

## 2015-03-16 DIAGNOSIS — E039 Hypothyroidism, unspecified: Secondary | ICD-10-CM | POA: Diagnosis not present

## 2015-03-16 DIAGNOSIS — I10 Essential (primary) hypertension: Secondary | ICD-10-CM | POA: Diagnosis not present

## 2015-03-16 DIAGNOSIS — G894 Chronic pain syndrome: Secondary | ICD-10-CM | POA: Diagnosis not present

## 2015-03-16 DIAGNOSIS — S42302S Unspecified fracture of shaft of humerus, left arm, sequela: Secondary | ICD-10-CM

## 2015-03-16 DIAGNOSIS — I509 Heart failure, unspecified: Secondary | ICD-10-CM | POA: Diagnosis not present

## 2015-03-16 LAB — CBC AND DIFFERENTIAL
HEMATOCRIT: 36 % (ref 36–46)
HEMOGLOBIN: 11.4 g/dL — AB (ref 12.0–16.0)
WBC: 8.4 10*3/mL

## 2015-03-16 LAB — HEPATIC FUNCTION PANEL
ALT: 13 U/L (ref 7–35)
AST: 13 U/L (ref 13–35)
Alkaline Phosphatase: 149 U/L — AB (ref 25–125)
BILIRUBIN, TOTAL: 0.4 mg/dL

## 2015-03-16 LAB — BASIC METABOLIC PANEL
BUN: 25 mg/dL — AB (ref 4–21)
Creatinine: 0.9 mg/dL (ref 0.5–1.1)
GLUCOSE: 96 mg/dL
Potassium: 5.1 mmol/L (ref 3.4–5.3)
SODIUM: 137 mmol/L (ref 137–147)

## 2015-03-16 NOTE — Progress Notes (Signed)
[name], female   DOB: [birth year], 81 y.o.   MRN: [id number]    Nursing Home Location:  [place] [facility] (91)  PCP: [name], MD  No Known Allergies  Chief Complaint  Patient presents with  . Acute Visit    multiple concerns     HPI:  Patient is a 81 y.o. female seen today at [facility] due to multiple concerns.She has PMH of HTN, OSA, hypothyroidism, frequent falls and urinary incontinence  Pt at [facility] post fall with left patella closed fracture, treatment by ortho includes knee immobilizer recommneded. She also had fractured her left shoulder recently and at present her left arm is in a sling. Her pain is not under control with current regimen. Pt reports she has chronic pain that was managed with mobic and norco prior to patella fx and shoulder fx. mobic was stopped during hospitalization. pain is now 12/10 per pt.  Pt also reports she conts to get too much levothyroxine, pt reports she was previously on 75 then due to increase TSH she was increased to 100 mcg daily. Order written yesterday to change levothyroxine to 100 mcg daily.  Pt reports she can not tolerate twice daily dosing of lasix. Currently taking 80 mg in the am and 40 mg in the pm for 3 days. Has been refusing evening lasix. No increase in shortness of breath or chest pains. Trace LE edema.   Review of Systems:  Review of Systems  Constitutional: Negative for activity change, appetite change, fatigue and unexpected weight change.  HENT: Negative for congestion and hearing loss.   Eyes: Negative.   Respiratory: Negative for cough and shortness of breath.   Cardiovascular: Negative for chest pain, palpitations and leg swelling.  Gastrointestinal: Negative for abdominal pain, diarrhea and constipation.  Genitourinary: Negative for dysuria and difficulty urinating.  Musculoskeletal: Positive for myalgias and arthralgias.   To left shoulder and knee   Skin: Negative for color change and wound.  Neurological: Negative for dizziness and weakness.  Psychiatric/Behavioral: Negative for behavioral problems, confusion and agitation.    Past Medical History  Diagnosis Date  . Rheumatoid arthritis(714.0)   . Unspecified essential hypertension   . Esophageal reflux   . Hypothyroidism   . Diverticulosis   . Insomnia   . OSA (obstructive sleep apnea)   . OAB (overactive bladder)   . LBP (low back pain)   . DOE (dyspnea on exertion)   . Shoulder pain, bilateral   . GERD (gastroesophageal reflux disease)   . OAB (overactive bladder)   . Depression   . Venous insufficiency   . Cardiomegaly   . Osteoarthritis    Past Surgical History  Procedure Laterality Date  . Appendectomy  1953  . Vesicovaginal fistula closure w/ tah    . Breast biopsy Right 1980  . Knee arthroscopy w/ autogenous cartilage implantation (aci) procedure Left 1994, 1995  . Knee arthroscopy Right 1996, 2010  . Shoulder surgery  1990  . Cystocele repair    . Cesarean section      1957, 1961, 1964  . Wrist surgery  1967  . Total abdominal hysterectomy  1972  . Bladder abduction-1996  1996  . Cosmetic surgery  1996   Social History:   reports that she quit smoking about 30 years ago. Her smoking use included Cigarettes. She quit after 10 years of use. She does not have any smokeless tobacco  history on file. She reports that she drinks alcohol. She reports that she does not use illicit drugs.  Family History  Problem Relation Age of Onset  . COPD Father   . Lung cancer Mother   . Breast cancer Maternal Aunt     Medications: Patient's Medications  New Prescriptions   No medications on file  Previous Medications   ASPIRIN 81 MG TABLET    Take 81 mg by mouth daily.   BUSPIRONE (BUSPAR) 7.5 MG TABLET    Take 7.5 mg by mouth 3 (three) times daily.   CHOLECALCIFEROL (VITAMIN D) 1000 UNITS TABLET    Take 1,000 Units by mouth daily.    DESMOPRESSIN (DDAVP) 0.2 MG TABLET    Take 0.4 mg by mouth at bedtime.    FUROSEMIDE (LASIX) 40 MG TABLET    Take 2 tablets by mouth at 8 am (80 mg), and then take 1 tablet by mouth at 2 pm (40 mg) every day   HYDROCODONE-ACETAMINOPHEN (NORCO) 7.5-325 MG TABLET    Take 1 tablet by mouth every 6 (six) hours as needed for moderate pain.   LEVOTHYROXINE (SYNTHROID, LEVOTHROID) 100 MCG TABLET    Take 100 mcg by mouth daily before breakfast.   LOSARTAN (COZAAR) 100 MG TABLET    Take 100 mg by mouth daily.   METOPROLOL (LOPRESSOR) 50 MG TABLET    Take 50 mg by mouth 2 (two) times daily.   MULTIPLE VITAMIN (MULTI-DAY VITAMINS PO)    Take by mouth.   NORTRIPTYLINE (PAMELOR) 25 MG CAPSULE    Take 25 mg by mouth at bedtime.   OMEPRAZOLE (PRILOSEC) 20 MG CAPSULE    Take 20 mg by mouth 2 (two) times daily before a meal.    POLYETHYLENE GLYCOL (MIRALAX / GLYCOLAX) PACKET    Take 17 g by mouth daily.   POTASSIUM CHLORIDE SA (K-DUR,KLOR-CON) 20 MEQ TABLET    Take 1 tablet (20 mEq total) by mouth daily.  Modified Medications   No medications on file  Discontinued Medications   No medications on file     Physical Exam: Filed Vitals:   03/16/15 1655  BP: 166/78  Pulse: 78  Temp: 97.8 F (36.6 C)  Resp: 20    Physical Exam  Constitutional: She is oriented to person, place, and time. She appears well-developed and well-nourished. No distress.  HENT:  Head: Normocephalic and atraumatic.  Eyes: Conjunctivae and EOM are normal. Pupils are equal, round, and reactive to light.  Neck: Normal range of motion. Neck supple. No JVD present.  Cardiovascular: Normal rate, regular rhythm and normal heart sounds.   Pulmonary/Chest: Effort normal and breath sounds normal.  Abdominal: Soft. Bowel sounds are normal.  Musculoskeletal: She exhibits edema (trace). She exhibits no tenderness.  Neurological: She is alert and oriented to person, place, and time.  Skin: Skin is warm and dry. She is not diaphoretic.    Psychiatric: She has a normal mood and affect.    Labs reviewed: Basic Metabolic Panel:  Recent Labs  03/06/15 1254 03/07/15 0418 03/08/15 0516  NA 137 137 136  K 4.6 4.0 4.6  CL 100* 103 102  CO2 27 29 27   GLUCOSE 93 121* 99  BUN 18 16 16   CREATININE 0.91 0.90 0.68  CALCIUM 9.1 8.4* 8.5*   Liver Function Tests:  Recent Labs  02/19/15 1057 03/06/15 1254  AST 14 19  ALT 10 12*  ALKPHOS 89 97  BILITOT 0.6 0.6  PROT 7.2 7.3  ALBUMIN 4.2  3.8   No results for input(s): LIPASE, AMYLASE in the last 8760 hours. No results for input(s): AMMONIA in the last 8760 hours. CBC:  Recent Labs  03/06/15 1254 03/07/15 0418 03/08/15 0516  WBC 8.9 7.7 9.8  NEUTROABS 6.2  --   --   HGB 13.2 11.0* 11.9*  HCT 41.6 34.5* 37.3  MCV 94.8 95.6 95.6  PLT 351 308 279   TSH:  Recent Labs  02/19/15 1057  TSH 4.847*   A1C: No results found for: HGBA1C Lipid Panel: No results for input(s): CHOL, HDL, LDLCALC, TRIG, CHOLHDL, LDLDIRECT in the last 8760 hours.  Assessment/Plan 1. Humeral fracture, left, sequela With increased pain. To cont sling. conts on norco 7.5/325 mg q 6 hour PRN. Will restart mobic 15 mg daily at this time  2. Essential hypertension Blood pressure elevated today, pt reports this is due to increase pain. Will cont to monitor at this time and may need adjustment in medication  3. Congestive heart failure, unspecified congestive heart failure chronicity, unspecified congestive heart failure type ([place]) -CHF stable, euvolemic at this time. Will reduce lasix to 80 mg daily at this time.   4. Chronic pain syndrome -acute on chronic pain. Pt reports during hospitalization pain was better controlled when she had mobic, will restart and monitor at this time.   5. Hypothyroidism, unspecified hypothyroidism type -order clarification given for synthroid 100 mcg daily     Jessica K. Harle Battiest  [facility] [phone] 8 am - 5 pm) [phone] (after hours)

## 2015-03-17 ENCOUNTER — Non-Acute Institutional Stay (SKILLED_NURSING_FACILITY): Payer: Medicare Other | Admitting: Internal Medicine

## 2015-03-17 DIAGNOSIS — I10 Essential (primary) hypertension: Secondary | ICD-10-CM

## 2015-03-17 DIAGNOSIS — S42295D Other nondisplaced fracture of upper end of left humerus, subsequent encounter for fracture with routine healing: Secondary | ICD-10-CM | POA: Diagnosis not present

## 2015-03-17 DIAGNOSIS — F418 Other specified anxiety disorders: Secondary | ICD-10-CM

## 2015-03-17 DIAGNOSIS — I5030 Unspecified diastolic (congestive) heart failure: Secondary | ICD-10-CM | POA: Diagnosis not present

## 2015-03-17 DIAGNOSIS — R2681 Unsteadiness on feet: Secondary | ICD-10-CM | POA: Diagnosis not present

## 2015-03-17 DIAGNOSIS — S82092A Other fracture of left patella, initial encounter for closed fracture: Secondary | ICD-10-CM | POA: Diagnosis not present

## 2015-03-17 NOTE — Progress Notes (Signed)
[name], female   DOB: 20-Oct-1932, 81 y.o.   MRN: [id number]       PCP: [name], MD  Code Status: full code  No Known Allergies  Chief Complaint  Patient presents with  . Acute Visit    anxiety, family concerns     HPI:  81 y.o. patient is seen today for acute concerns. Her daughter is present this visit. Her daughter has concerns about patient being on lasix as she was under the underdstanding that she did not need lasix. She is concerned about her mother being woken up at 4-5 am to get her weighed. Patient has been able to sleep somewhat better with melatonin. BP reading on review is elevated but better than last visit. She has been anxious lately per daughter and is pending visit from psychiatry. Reviewed her medications. She has appointment with orthopedics this afternoon. She is here for STR post fall with left patella closed fracture and has knee immobilizer in place. She also had fractured her left shoulder recently and at present her left arm is in a sling. Her pain medication was increased in frequency is under control with current regimen. She has PMH of HTN, OSA, hypothyroidism, frequent falls and urinary incontinence among others.    Review of Systems:  HENT: Negative for headache, congestion, nasal discharge, difficulty swallowing.   Eyes: Negative for eye pain, blurred vision, double vision and discharge.  Respiratory: Negative for cough, shortness of breath and wheezing.   Cardiovascular: Negative for chest pain, palpitations. Positive for leg swelling.  Gastrointestinal: Negative for heartburn, nausea, vomiting, abdominal pain. Had bowel movement today Genitourinary: Negative for dysuria. has urinary incontinence Musculoskeletal: Negative for back pain, falls in the facility. Skin: Negative for itching, rash.  Neurological: Negative for dizziness Psychiatric/Behavioral: positive for anxiety   Past Medical History  Diagnosis Date    . Rheumatoid arthritis(714.0)   . Unspecified essential hypertension   . Esophageal reflux   . Hypothyroidism   . Diverticulosis   . Insomnia   . OSA (obstructive sleep apnea)   . OAB (overactive bladder)   . LBP (low back pain)   . DOE (dyspnea on exertion)   . Shoulder pain, bilateral   . GERD (gastroesophageal reflux disease)   . OAB (overactive bladder)   . Depression   . Venous insufficiency   . Cardiomegaly   . Osteoarthritis     Medications:   Medication List       This list is accurate as of: 03/17/15  2:21 PM.  Always use your most recent med list.               aspirin 81 MG tablet  Take 81 mg by mouth daily.     busPIRone 7.5 MG tablet  Commonly known as:  BUSPAR  Take 7.5 mg by mouth 3 (three) times daily.     cholecalciferol 1000 UNITS tablet  Commonly known as:  VITAMIN D  Take 1,000 Units by mouth daily.     desmopressin 0.2 MG tablet  Commonly known as:  DDAVP  Take 0.4 mg by mouth at bedtime.     furosemide 40 MG tablet  Commonly known as:  LASIX  Take 2 tablets by mouth at 8 am (80 mg), and then take 1 tablet by mouth at 2 pm (40 mg) every day     hydrALAZINE 10 MG tablet  Commonly known as:  APRESOLINE  Take 10 mg by mouth 2 (two) times daily.  HYDROcodone-acetaminophen 7.5-325 MG tablet  Commonly known as:  NORCO  Take 1 tablet by mouth every 6 (six) hours as needed for moderate pain.     levothyroxine 100 MCG tablet  Commonly known as:  SYNTHROID, LEVOTHROID  Take 100 mcg by mouth daily before breakfast.     losartan 100 MG tablet  Commonly known as:  COZAAR  Take 100 mg by mouth daily.     Melatonin 5 MG Tabs  Take by mouth.     meloxicam 15 MG tablet  Commonly known as:  MOBIC  Take 15 mg by mouth daily.     metoprolol 50 MG tablet  Commonly known as:  LOPRESSOR  Take 50 mg by mouth 2 (two) times daily.     MULTI-DAY VITAMINS PO  Take by mouth.     nortriptyline 25 MG capsule  Commonly known as:  PAMELOR  Take  25 mg by mouth at bedtime.     omeprazole 20 MG capsule  Commonly known as:  PRILOSEC  Take 20 mg by mouth 2 (two) times daily before a meal.     polyethylene glycol packet  Commonly known as:  MIRALAX / GLYCOLAX  Take 17 g by mouth 2 (two) times daily.     potassium chloride SA 20 MEQ tablet  Commonly known as:  K-DUR,KLOR-CON  Take 1 tablet (20 mEq total) by mouth daily.     sennosides-docusate sodium 8.6-50 MG tablet  Commonly known as:  SENOKOT-S  Take 2 tablets by mouth daily.         Physical Exam: Filed Vitals:   03/17/15 1415  BP: 146/90  Pulse: 62  Temp: 98 F (36.7 C)  Resp: 18    General- elderly female, overweight, in no acute distress Head- normocephalic, atraumatic Eyes- no pallor, no icterus, no discharge, normal conjunctiva, normal sclera Neck- no cervical lymphadenopathy Cardiovascular- normal s1,s2, no murmurs, + leg edema Respiratory- bilateral clear to auscultation, no wheeze, no rhonchi, no crackles, no use of accessory muscles Musculoskeletal-generalized weakness, left knee in immobilizer, using walker, left arm in sling, can move her digits, using wheelchair and hemiwalker Neurological- no focal deficit, alert and oriented to person, place and time Skin- warm and dry Psychiatry- anxiety present  Labs reviewed: Basic Metabolic Panel:  Recent Labs  03/06/15 1254 03/07/15 0418 03/08/15 0516  NA 137 137 136  K 4.6 4.0 4.6  CL 100* 103 102  CO2 27 29 27   GLUCOSE 93 121* 99  BUN 18 16 16   CREATININE 0.91 0.90 0.68  CALCIUM 9.1 8.4* 8.5*   Liver Function Tests:  Recent Labs  02/19/15 1057 03/06/15 1254  AST 14 19  ALT 10 12*  ALKPHOS 89 97  BILITOT 0.6 0.6  PROT 7.2 7.3  ALBUMIN 4.2 3.8   No results for input(s): LIPASE, AMYLASE in the last 8760 hours. No results for input(s): AMMONIA in the last 8760 hours. CBC:  Recent Labs  03/06/15 1254 03/07/15 0418 03/08/15 0516  WBC 8.9 7.7 9.8  NEUTROABS 6.2  --   --   HGB  13.2 11.0* 11.9*  HCT 41.6 34.5* 37.3  MCV 94.8 95.6 95.6  PLT 351 308 279     Assessment/Plan  Unsteady gait With frequent falls. Will have her work with physical therapy and occupational therapy team to help with gait training and muscle strengthening exercises.fall precautions. Skin care. Encourage to be out of bed. Gel overaly mattress to prevent bedsores given her decreased mobility in bed. Will have  therapy to evaluate for wheelchair with leg rest. Daughter wants to get lift chair from home as patient was sleeping on it at home. Have informed family to ask Dr Maureen Ralphs during orthopedic office visit today and if cleared, it will be ok to have it in her room here for sleep. To wear her immobilizer to left leg and sling to left arm all the time. Continue norco 7.5-325 mg 1 tab q4h prn pain with tylenol 650 mg q4h prn pain. Has follow up with orthopedics today. Continue her baby aspirin. Print out about care during transfer has been pasted on the walls of her room.  Depression and anxiety Continue pamelor 25 mg daily. On buspirone 7.5 mg tid. Change buspirone morning dosing to 10 mg daily and continue 7.5 mg for noon and evening dose. Will need to be seen by psychiatry. Will avoid benzodiazepine given her frequent falls  HTN Improved from last visit. BP Readings from Last 3 Encounters:  03/17/15 146/90  03/16/15 166/78  03/12/15 167/94  On cozaar 100 mg daily, lopressor 50 mg bid and hydralazine 10 mg bid. Check BP q shift x 1 week  Diastolic chf Continue cozaar 100 mg daily, lopressor 50 mg bid. Continue lasix 80 mg in am only. Pending bmp 03/23/15. Continue kcl supplement. will need daily weight but change this to 6 am.   Goals of care: short term rehabilitation   Family/ staff Communication: reviewed care plan with patient, her daughter and tried to answer their question. Spoke with [facility] of nursing regarding family concerns.    Blanchie Serve, MD  [facility] [phone] (Monday-Friday 8 am - 5 pm) [phone] (afterhours)

## 2015-03-18 ENCOUNTER — Encounter: Payer: Self-pay | Admitting: Internal Medicine

## 2015-03-18 ENCOUNTER — Non-Acute Institutional Stay (SKILLED_NURSING_FACILITY): Payer: Medicare Other | Admitting: Internal Medicine

## 2015-03-18 DIAGNOSIS — I509 Heart failure, unspecified: Secondary | ICD-10-CM | POA: Diagnosis not present

## 2015-03-18 DIAGNOSIS — F329 Major depressive disorder, single episode, unspecified: Secondary | ICD-10-CM

## 2015-03-18 DIAGNOSIS — R600 Localized edema: Secondary | ICD-10-CM | POA: Diagnosis not present

## 2015-03-18 DIAGNOSIS — S42302S Unspecified fracture of shaft of humerus, left arm, sequela: Secondary | ICD-10-CM

## 2015-03-18 DIAGNOSIS — F411 Generalized anxiety disorder: Secondary | ICD-10-CM | POA: Diagnosis not present

## 2015-03-18 DIAGNOSIS — S82001S Unspecified fracture of right patella, sequela: Secondary | ICD-10-CM

## 2015-03-18 NOTE — Progress Notes (Signed)
[name], female   DOB: 08/19/1932, 81 y.o.   MRN: [id number]     [facility]  PCP: [name], MD  Code Status: Full Code  No Known Allergies  Chief Complaint  Patient presents with  . Discharge Note    Discharge from SNF      HPI:  81 y.o. patient is seen for discharge visit. She would like to transfer her care to [facility]. She is here for short term rehabilitation post hospital admission from 03/06/15-03/10/15 post fall with left patella closed fracture s/p supportive care with knee immobilizer in place. She also had fractured her left shoulder recently and at present her left arm is in a sling. Her pain is under control with medication adjustment. Her lasix dosing has been changed per patient request from 80 mg in am and 40 mg in pm to 80 mg daily. Her buspar dosing has been increased with her anxiety. She has worked well with therapy team during her stay and will need to continue therapy at ALF. She has PMH of HTN, OSA, hypothyroidism, frequent falls and urinary incontinence among others.   Review of Systems:  Constitutional: Negative for fever, chills, diaphoresis.  HENT: Negative for headache, congestion, nasal discharge, difficulty swallowing.   Eyes: Negative for eye pain, blurred vision, double vision and discharge.  Respiratory: Negative for cough, shortness of breath and wheezing.   Cardiovascular: Negative for chest pain, palpitations. Positive for leg swelling.  Gastrointestinal: Negative for heartburn, nausea, vomiting, abdominal pain. Improved bowel movement, loose stool today Genitourinary: Negative for dysuria, flank pain. has urinary incontinence Musculoskeletal: Negative for back pain, falls in the facility. Skin: Negative for itching, rash.  Neurological: Negative for dizziness, tingling, focal weakness Psychiatric/Behavioral: positive for anxiety   Past Medical History  Diagnosis Date  . Rheumatoid  arthritis(714.0)   . Unspecified essential hypertension   . Esophageal reflux   . Hypothyroidism   . Diverticulosis   . Insomnia   . OSA (obstructive sleep apnea)   . OAB (overactive bladder)   . LBP (low back pain)   . DOE (dyspnea on exertion)   . Shoulder pain, bilateral   . GERD (gastroesophageal reflux disease)   . OAB (overactive bladder)   . Depression   . Venous insufficiency   . Cardiomegaly   . Osteoarthritis     Medications:   Medication List       This list is accurate as of: 03/18/15 11:59 PM.  Always use your most recent med list.               acetaminophen 325 MG tablet  Commonly known as:  TYLENOL  Take 650 mg by mouth every 4 (four) hours as needed for mild pain.     aspirin 81 MG chewable tablet  Chew 81 mg by mouth daily.     busPIRone 7.5 MG tablet  Commonly known as:  BUSPAR  Take 7.5 mg by mouth 2 (two) times daily. In the morning and afternoon for anxiety     busPIRone 10 MG tablet  Commonly known as:  BUSPAR  Take 10 mg by mouth daily. In the evening     cholecalciferol 1000 UNITS tablet  Commonly known as:  VITAMIN D  Take 1,000 Units by mouth daily.     desmopressin 0.2 MG tablet  Commonly known as:  DDAVP  Take 0.4 mg by mouth at bedtime.     furosemide 80 MG tablet  Commonly  known as:  LASIX  Take 80 mg by mouth daily. In the morning     hydrALAZINE 10 MG tablet  Commonly known as:  APRESOLINE  Take 10 mg by mouth 2 (two) times daily. HOLD for SBP <110     HYDROcodone-acetaminophen 7.5-325 MG tablet  Commonly known as:  NORCO  Take 1 tablet by mouth every 4 (four) hours as needed for moderate pain.     levothyroxine 100 MCG tablet  Commonly known as:  SYNTHROID, LEVOTHROID  Take 100 mcg by mouth daily before breakfast.     losartan 100 MG tablet  Commonly known as:  COZAAR  Take 100 mg by mouth daily.     Melatonin 5 MG Tabs  Take by mouth.     meloxicam 15 MG tablet  Commonly known as:  MOBIC  Take 15 mg by  mouth daily.     metoprolol 50 MG tablet  Commonly known as:  LOPRESSOR  Take 50 mg by mouth 2 (two) times daily.     MULTI-DAY VITAMINS PO  Take by mouth.     nortriptyline 25 MG capsule  Commonly known as:  PAMELOR  Take 25 mg by mouth at bedtime.     omeprazole 20 MG capsule  Commonly known as:  PRILOSEC  Take 20 mg by mouth 2 (two) times daily before a meal.     polyethylene glycol packet  Commonly known as:  MIRALAX / GLYCOLAX  Take 17 g by mouth daily.     potassium chloride SA 20 MEQ tablet  Commonly known as:  K-DUR,KLOR-CON  Take 1 tablet (20 mEq total) by mouth daily.     sennosides-docusate sodium 8.6-50 MG tablet  Commonly known as:  SENOKOT-S  Take 2 tablets by mouth at bedtime.         Physical Exam: Filed Vitals:   03/18/15 1511  BP: 110/75  Pulse: 69  Temp: 97.7 F (36.5 C)  TempSrc: Oral  Resp: 20  SpO2: 95%    General- elderly female, overweight, in no acute distress Head- normocephalic, atraumatic Nose- normal nasal mucosa, no maxillary or frontal sinus tenderness, no nasal discharge Throat- moist mucus membrane  Eyes- PERRLA, EOMI, no pallor, no icterus, no discharge, normal conjunctiva, normal sclera Neck- no cervical lymphadenopathy Cardiovascular- normal s1,s2, no murmurs, palpable dorsalis pedis and radial pulses, trace right and 1 + left leg edema Respiratory- bilateral clear to auscultation, no wheeze, no rhonchi, no crackles, no use of accessory muscles Abdomen- bowel sounds present, soft, non tender Musculoskeletal-generalized weakness, left knee in immobilizer, using walker, left arm in sling, can move her digits Neurological- no focal deficit, alert and oriented to person, place and time Skin- warm and dry Psychiatry- anxiety present   Labs reviewed: Basic Metabolic Panel:  Recent Labs  03/06/15 1254 03/07/15 0418 03/08/15 0516 03/16/15  NA 137 137 136 137  K 4.6 4.0 4.6 5.1  CL 100* 103 102  --   CO2 27 29 27   --     GLUCOSE 93 121* 99  --   BUN 18 16 16  25*  CREATININE 0.91 0.90 0.68 0.9  CALCIUM 9.1 8.4* 8.5*  --    Liver Function Tests:  Recent Labs  02/19/15 1057 03/06/15 1254 03/16/15  AST 14 19 13   ALT 10 12* 13  ALKPHOS 89 97 149*  BILITOT 0.6 0.6  --   PROT 7.2 7.3  --   ALBUMIN 4.2 3.8  --    No results for input(s):  LIPASE, AMYLASE in the last 8760 hours. No results for input(s): AMMONIA in the last 8760 hours. CBC:  Recent Labs  03/06/15 1254 03/07/15 0418 03/08/15 0516 03/16/15  WBC 8.9 7.7 9.8 8.4  NEUTROABS 6.2  --   --   --   HGB 13.2 11.0* 11.9* 11.4*  HCT 41.6 34.5* 37.3 36  MCV 94.8 95.6 95.6  --   PLT 351 308 279  --     Radiological Exams: Dg Chest 2 View  03/06/2015  CLINICAL DATA:  Fall. Recent shoulder fracture. Anterior lower chest wall pain. EXAM: CHEST  2 VIEW COMPARISON:  08/09/2010 chest radiograph. FINDINGS: Stable cardiomediastinal silhouette with top-normal heart size. No pneumothorax. No pleural effusion. No pulmonary edema. Mild curvilinear opacities are present at both lung bases. No displaced fracture in the visualized chest. Severe osteoarthritis in the bilateral glenohumeral joints. IMPRESSION: Mild curvilinear opacities at both lung bases, favor scarring or atelectasis. Electronically Signed   By: Ilona Sorrel M.D.   On: 03/06/2015 12:09   Dg Wrist Complete Left  03/06/2015  CLINICAL DATA:  Tripped and fell today. Initial encounter. The patient fell to her knees. Burning and pain in the left wrist. Personal history of left wrist fracture 2 years ago. EXAM: LEFT WRIST - COMPLETE 3+ VIEW COMPARISON:  Left wrist radiographs 02/22/2013 FINDINGS: Moderate osteopenia is present. Previous fracture has healed. No definite acute fracture is evident. Degenerative changes extend to the DRUJ. IMPRESSION: 1. No acute abnormality. 2. Remote fractures have healed. 3. Moderate osteopenia. Electronically Signed   By: San Morelle M.D.   On: 03/06/2015 12:10    Ct Head Wo Contrast  03/06/2015  CLINICAL DATA:  Multiple falls last week. EXAM: CT HEAD WITHOUT CONTRAST TECHNIQUE: Contiguous axial images were obtained from the base of the skull through the vertex without intravenous contrast. COMPARISON:  None. FINDINGS: Ventricles, cisterns and other CSF spaces are within normal. There is no mass, mass effect, shift of midline structures or acute hemorrhage. No evidence of acute infarction. Minimal degenerative change of the temporomandibular joints. Remaining bones and soft tissues are within normal. IMPRESSION: No acute intracranial findings. Electronically Signed   By: Marin Olp M.D.   On: 03/06/2015 16:53   Mr Brain Wo Contrast  03/07/2015  CLINICAL DATA:  Gait abnormality.  Multiple recent falls. EXAM: MRI HEAD WITHOUT CONTRAST TECHNIQUE: Multiplanar, multiecho pulse sequences of the brain and surrounding structures were obtained without intravenous contrast. COMPARISON:  Head CT 03/06/2015 FINDINGS: Mild motion artifact. There is no evidence of acute infarct, intracranial hemorrhage, mass, midline shift, or extra-axial fluid collection. Mild cerebral atrophy is within normal limits for age. Small foci of T2 hyperintensity in the subcortical and deep cerebral white matter and pons are nonspecific but compatible with mild chronic small vessel ischemic disease. Orbits are unremarkable. Paranasal sinuses and mastoid air cells are clear. Major intracranial vascular flow voids are preserved. IMPRESSION: 1. No acute intracranial abnormality. 2. Mild chronic small vessel ischemic disease. Electronically Signed   By: Logan Bores M.D.   On: 03/07/2015 12:49   Ct Shoulder Left Wo Contrast  03/07/2015  CLINICAL DATA:  Recurrent falls with left shoulder pain. Question fracture. Initial encounter. EXAM: CT OF THE LEFT SHOULDER WITHOUT CONTRAST TECHNIQUE: Multidetector CT imaging was performed according to the standard protocol. Multiplanar CT image reconstructions  were also generated. COMPARISON:  Plain films left shoulder 03/06/2015. FINDINGS: The patient has a fracture of the proximal humerus which shows mild lateral displacement of approximately 0.8 cm.  The fracture extends from the posterior, inferior margin of the humeral head in an anterior and inferior orientation through the surgical neck. Small cortical break is also seen in the posterior surgical neck. The fracture does not appear to involve the greater or lesser tuberosities. No other fracture is identified. The acromioclavicular joint is intact. There is severe glenohumeral osteoarthritis with marked joint space narrowing, osteophytosis and remodeling of the glenoid. Moderate acromioclavicular degenerative change is seen. Musculature about the shoulder girdle is somewhat atrophied. No rotator cuff tear is visualized on this exam. Imaged lung parenchyma is clear. IMPRESSION: Acute fracture of the proximal humerus shows mild lateral displacement but does not appear to involve the greater or lesser tuberosities. Severe glenohumeral osteoarthritis. Moderate acromioclavicular degenerative change. Electronically Signed   By: Inge Rise M.D.   On: 03/07/2015 11:31   Dg Shoulder Left  03/06/2015  CLINICAL DATA:  Initial encounter for Pt tripped and fell this morning, she went down and hit both of her knees, then fell and landed on her left side. Known Left shoulder Fx from 2 weeks ago, and she has a previous left wrist Fx from 2014. She has bilateral knee p.*comment was truncated* EXAM: LEFT SHOULDER - 2+ VIEW COMPARISON:  None. FINDINGS: Osteopenia. Visualized portion of the left hemithorax is normal. Advanced glenohumeral joint osteoarthritis. Undersurface acromioclavicular joint degenerative change. High-riding humeral head consistent with chronic rotator cuff insufficiency. No acute fracture or dislocation. IMPRESSION: Advanced osteoarthritis.  No acute findings. Electronically Signed   By: Abigail Miyamoto M.D.    On: 03/06/2015 12:09   Dg Knee Complete 4 Views Left  03/06/2015  CLINICAL DATA:  Knee pain secondary to a fall this morning. EXAM: LEFT KNEE - COMPLETE 4+ VIEW COMPARISON:  None. FINDINGS: There is a nondisplaced fracture of the mid patella. There is a small joint effusion. The patient has had a hemiarthroplasty in the medial compartment. There are slight degenerative changes in the patellofemoral compartment and in the lateral compartment with chondromalacia in the lateral compartment. There is slight irregularity of the lateral tibial plateau beneath the prosthesis. IMPRESSION: Acute nondisplaced fracture of the mid patella. Electronically Signed   By: Lorriane Shire M.D.   On: 03/06/2015 12:09   Dg Knee Complete 4 Views Right  03/06/2015  CLINICAL DATA:  Initial encounter for Pt tripped and fell this morning, she went down and hit both of her knees, then fell and landed on her left side. Known Left shoulder Fx from 2 weeks ago, and she has a previous left wrist Fx from 2014. She has bilateral knee p.*comment was truncated* EXAM: RIGHT KNEE - COMPLETE 4+ VIEW COMPARISON:  None. FINDINGS: Knee arthroplasty. No acute hardware complication. No joint effusion. No acute fracture or dislocation. IMPRESSION: Expected appearance after knee arthroplasty. Electronically Signed   By: Abigail Miyamoto M.D.   On: 03/06/2015 12:07   Dg Hips Bilat With Pelvis 3-4 Views  03/06/2015  CLINICAL DATA:  Fall.  Initial evaluation. EXAM: DG HIP (WITH OR WITHOUT PELVIS) 3-4V BILAT COMPARISON:  No prior exam for comparison. FINDINGS: Diffuse osteopenia degenerative change. No acute abnormality. No evidence of fracture dislocation. IMPRESSION: Diffuse osteopenia degenerative change.  No acute abnormality. Electronically Signed   By: Marcello Moores  Register   On: 03/06/2015 12:07    Assessment/Plan  Patient is clinically stable to be discharged to [facility]. She will need home health services as below: PT and OT to evaluate  and treat for gait training and strengthening exercises Follow up with  orthopedics DME: semi electric hospital bed with her limited mobility and her non weight bearing status to LUE and LLE, to reduce fall risk and for safe transfer in and out of bed Hemi walker to help provide assistance with her ADLs which cannot be provided by a cane at present  Script for her medications have been provided and medication list provided FL2 form has been filled out Face to face encounter form and DME form has been filled out Follow up with physician at the facility or her regular PCP is recommended within 2 weeks of discharge Psychiatry appointment as outpatient recommended and patient to make one Currently on 80 mg lasix from 80 mg in am and 40 mg in pm at discharge. Will recommend further decrease to 40 mg daily and then 20 mg daily if tolerated depending on her clinical symptom. Has outpatient cardiology, will defer to them at present.    Family/ staff Communication: reviewed care plan with patient and nursing supervisor    Blanchie Serve, MD  [facility] [phone] (Monday-Friday 8 am - 5 pm) [phone] (afterhours)  Scripts, DC to [facility], HH assess for PT/OT. DME: Semi electric hospital bed, hemi walker.

## 2015-03-21 DIAGNOSIS — R2689 Other abnormalities of gait and mobility: Secondary | ICD-10-CM | POA: Diagnosis not present

## 2015-03-21 DIAGNOSIS — I5032 Chronic diastolic (congestive) heart failure: Secondary | ICD-10-CM | POA: Diagnosis not present

## 2015-03-21 DIAGNOSIS — S4292XD Fracture of left shoulder girdle, part unspecified, subsequent encounter for fracture with routine healing: Secondary | ICD-10-CM | POA: Diagnosis not present

## 2015-03-21 DIAGNOSIS — S82002D Unspecified fracture of left patella, subsequent encounter for closed fracture with routine healing: Secondary | ICD-10-CM | POA: Diagnosis not present

## 2015-03-21 DIAGNOSIS — I110 Hypertensive heart disease with heart failure: Secondary | ICD-10-CM | POA: Diagnosis not present

## 2015-03-21 DIAGNOSIS — M6281 Muscle weakness (generalized): Secondary | ICD-10-CM | POA: Diagnosis not present

## 2015-03-25 DIAGNOSIS — M79602 Pain in left arm: Secondary | ICD-10-CM | POA: Diagnosis not present

## 2015-03-25 DIAGNOSIS — S82042A Displaced comminuted fracture of left patella, initial encounter for closed fracture: Secondary | ICD-10-CM | POA: Diagnosis not present

## 2015-03-25 DIAGNOSIS — R2689 Other abnormalities of gait and mobility: Secondary | ICD-10-CM | POA: Diagnosis not present

## 2015-03-25 DIAGNOSIS — F4321 Adjustment disorder with depressed mood: Secondary | ICD-10-CM | POA: Diagnosis not present

## 2015-03-25 DIAGNOSIS — R040 Epistaxis: Secondary | ICD-10-CM | POA: Diagnosis not present

## 2015-03-25 DIAGNOSIS — R05 Cough: Secondary | ICD-10-CM | POA: Diagnosis not present

## 2015-03-25 DIAGNOSIS — Z6837 Body mass index (BMI) 37.0-37.9, adult: Secondary | ICD-10-CM | POA: Diagnosis not present

## 2015-04-03 ENCOUNTER — Encounter (HOSPITAL_COMMUNITY): Payer: Self-pay

## 2015-04-03 ENCOUNTER — Inpatient Hospital Stay (HOSPITAL_COMMUNITY)
Admission: EM | Admit: 2015-04-03 | Discharge: 2015-04-05 | DRG: 378 | Disposition: A | Discharge status: Skilled Nursing Facility | Payer: Medicare Other | Attending: Internal Medicine | Admitting: Internal Medicine

## 2015-04-03 DIAGNOSIS — K2970 Gastritis, unspecified, without bleeding: Secondary | ICD-10-CM | POA: Diagnosis not present

## 2015-04-03 DIAGNOSIS — K625 Hemorrhage of anus and rectum: Secondary | ICD-10-CM

## 2015-04-03 DIAGNOSIS — S82009A Unspecified fracture of unspecified patella, initial encounter for closed fracture: Secondary | ICD-10-CM | POA: Diagnosis present

## 2015-04-03 DIAGNOSIS — K922 Gastrointestinal hemorrhage, unspecified: Secondary | ICD-10-CM | POA: Diagnosis not present

## 2015-04-03 DIAGNOSIS — G4733 Obstructive sleep apnea (adult) (pediatric): Secondary | ICD-10-CM | POA: Diagnosis not present

## 2015-04-03 DIAGNOSIS — M069 Rheumatoid arthritis, unspecified: Secondary | ICD-10-CM | POA: Diagnosis present

## 2015-04-03 DIAGNOSIS — F419 Anxiety disorder, unspecified: Secondary | ICD-10-CM | POA: Diagnosis present

## 2015-04-03 DIAGNOSIS — Z87891 Personal history of nicotine dependence: Secondary | ICD-10-CM

## 2015-04-03 DIAGNOSIS — I10 Essential (primary) hypertension: Secondary | ICD-10-CM | POA: Diagnosis present

## 2015-04-03 DIAGNOSIS — S42202D Unspecified fracture of upper end of left humerus, subsequent encounter for fracture with routine healing: Secondary | ICD-10-CM

## 2015-04-03 DIAGNOSIS — W010XXD Fall on same level from slipping, tripping and stumbling without subsequent striking against object, subsequent encounter: Secondary | ICD-10-CM | POA: Diagnosis present

## 2015-04-03 DIAGNOSIS — R6889 Other general symptoms and signs: Secondary | ICD-10-CM | POA: Diagnosis not present

## 2015-04-03 DIAGNOSIS — G4700 Insomnia, unspecified: Secondary | ICD-10-CM | POA: Diagnosis present

## 2015-04-03 DIAGNOSIS — K5791 Diverticulosis of intestine, part unspecified, without perforation or abscess with bleeding: Principal | ICD-10-CM | POA: Diagnosis present

## 2015-04-03 DIAGNOSIS — Z7982 Long term (current) use of aspirin: Secondary | ICD-10-CM

## 2015-04-03 DIAGNOSIS — I110 Hypertensive heart disease with heart failure: Secondary | ICD-10-CM | POA: Diagnosis present

## 2015-04-03 DIAGNOSIS — E039 Hypothyroidism, unspecified: Secondary | ICD-10-CM | POA: Diagnosis present

## 2015-04-03 DIAGNOSIS — I5032 Chronic diastolic (congestive) heart failure: Secondary | ICD-10-CM

## 2015-04-03 DIAGNOSIS — R296 Repeated falls: Secondary | ICD-10-CM | POA: Diagnosis present

## 2015-04-03 DIAGNOSIS — N3281 Overactive bladder: Secondary | ICD-10-CM | POA: Diagnosis present

## 2015-04-03 DIAGNOSIS — Z23 Encounter for immunization: Secondary | ICD-10-CM

## 2015-04-03 DIAGNOSIS — K219 Gastro-esophageal reflux disease without esophagitis: Secondary | ICD-10-CM | POA: Diagnosis present

## 2015-04-03 DIAGNOSIS — S42302A Unspecified fracture of shaft of humerus, left arm, initial encounter for closed fracture: Secondary | ICD-10-CM | POA: Diagnosis present

## 2015-04-03 DIAGNOSIS — S82002D Unspecified fracture of left patella, subsequent encounter for closed fracture with routine healing: Secondary | ICD-10-CM

## 2015-04-03 DIAGNOSIS — Z79899 Other long term (current) drug therapy: Secondary | ICD-10-CM

## 2015-04-03 DIAGNOSIS — F329 Major depressive disorder, single episode, unspecified: Secondary | ICD-10-CM | POA: Diagnosis present

## 2015-04-03 DIAGNOSIS — M1990 Unspecified osteoarthritis, unspecified site: Secondary | ICD-10-CM | POA: Diagnosis present

## 2015-04-03 DIAGNOSIS — I872 Venous insufficiency (chronic) (peripheral): Secondary | ICD-10-CM | POA: Diagnosis present

## 2015-04-03 LAB — I-STAT CHEM 8, ED
BUN: 22 mg/dL — ABNORMAL HIGH (ref 6–20)
CHLORIDE: 92 mmol/L — AB (ref 101–111)
Calcium, Ion: 1.15 mmol/L (ref 1.13–1.30)
Creatinine, Ser: 0.8 mg/dL (ref 0.44–1.00)
Glucose, Bld: 129 mg/dL — ABNORMAL HIGH (ref 65–99)
HEMATOCRIT: 37 % (ref 36.0–46.0)
Hemoglobin: 12.6 g/dL (ref 12.0–15.0)
POTASSIUM: 4 mmol/L (ref 3.5–5.1)
SODIUM: 132 mmol/L — AB (ref 135–145)
TCO2: 33 mmol/L (ref 0–100)

## 2015-04-03 LAB — CBC WITH DIFFERENTIAL/PLATELET
BASOS ABS: 0 10*3/uL (ref 0.0–0.1)
BASOS PCT: 0 %
EOS ABS: 0.3 10*3/uL (ref 0.0–0.7)
EOS PCT: 3 %
HCT: 34.5 % — ABNORMAL LOW (ref 36.0–46.0)
Hemoglobin: 11.2 g/dL — ABNORMAL LOW (ref 12.0–15.0)
LYMPHS PCT: 15 %
Lymphs Abs: 1.3 10*3/uL (ref 0.7–4.0)
MCH: 30.1 pg (ref 26.0–34.0)
MCHC: 32.5 g/dL (ref 30.0–36.0)
MCV: 92.7 fL (ref 78.0–100.0)
Monocytes Absolute: 0.7 10*3/uL (ref 0.1–1.0)
Monocytes Relative: 8 %
Neutro Abs: 6.6 10*3/uL (ref 1.7–7.7)
Neutrophils Relative %: 74 %
PLATELETS: 331 10*3/uL (ref 150–400)
RBC: 3.72 MIL/uL — AB (ref 3.87–5.11)
RDW: 13 % (ref 11.5–15.5)
WBC: 8.9 10*3/uL (ref 4.0–10.5)

## 2015-04-03 LAB — COMPREHENSIVE METABOLIC PANEL
ALT: 11 U/L — ABNORMAL LOW (ref 14–54)
AST: 18 U/L (ref 15–41)
Albumin: 3.2 g/dL — ABNORMAL LOW (ref 3.5–5.0)
Alkaline Phosphatase: 119 U/L (ref 38–126)
Anion gap: 8 (ref 5–15)
BILIRUBIN TOTAL: 0.4 mg/dL (ref 0.3–1.2)
BUN: 20 mg/dL (ref 6–20)
CO2: 30 mmol/L (ref 22–32)
Calcium: 8.7 mg/dL — ABNORMAL LOW (ref 8.9–10.3)
Chloride: 95 mmol/L — ABNORMAL LOW (ref 101–111)
Creatinine, Ser: 0.75 mg/dL (ref 0.44–1.00)
GFR calc Af Amer: >60 mL/min (ref >60)
Glucose, Bld: 141 mg/dL — ABNORMAL HIGH (ref 65–99)
POTASSIUM: 4.2 mmol/L (ref 3.5–5.1)
Sodium: 133 mmol/L — ABNORMAL LOW (ref 135–145)
TOTAL PROTEIN: 6.4 g/dL — AB (ref 6.5–8.1)

## 2015-04-03 LAB — CBC
HCT: 32 % — ABNORMAL LOW (ref 36.0–46.0)
HEMOGLOBIN: 10.7 g/dL — AB (ref 12.0–15.0)
MCH: 30.9 pg (ref 26.0–34.0)
MCHC: 33.4 g/dL (ref 30.0–36.0)
MCV: 92.5 fL (ref 78.0–100.0)
Platelets: 312 10*3/uL (ref 150–400)
RBC: 3.46 MIL/uL — AB (ref 3.87–5.11)
RDW: 13 % (ref 11.5–15.5)
WBC: 10 10*3/uL (ref 4.0–10.5)

## 2015-04-03 LAB — TYPE AND SCREEN
ABO/RH(D): B POS
ANTIBODY SCREEN: NEGATIVE

## 2015-04-03 LAB — MRSA PCR SCREENING: MRSA by PCR: NEGATIVE

## 2015-04-03 MED ORDER — ACETAMINOPHEN 650 MG RE SUPP
650.0000 mg | Freq: Four times a day (QID) | RECTAL | Status: DC | PRN
Start: 2015-04-03 — End: 2015-04-04

## 2015-04-03 MED ORDER — MORPHINE SULFATE (PF) 4 MG/ML IV SOLN
4.0000 mg | Freq: Once | INTRAVENOUS | Status: AC
  Administered 2015-04-03: 4 mg via INTRAVENOUS
  Filled 2015-04-03: qty 1

## 2015-04-03 MED ORDER — METOPROLOL TARTRATE 25 MG PO TABS
25.0000 mg | ORAL_TABLET | Freq: Two times a day (BID) | ORAL | Status: DC
  Administered 2015-04-03 – 2015-04-05 (×5): 25 mg via ORAL
  Filled 2015-04-03 (×5): qty 1

## 2015-04-03 MED ORDER — BUSPIRONE HCL 5 MG PO TABS
10.0000 mg | ORAL_TABLET | Freq: Two times a day (BID) | ORAL | Status: DC
  Administered 2015-04-03: 10 mg via ORAL
  Filled 2015-04-03: qty 2

## 2015-04-03 MED ORDER — PANTOPRAZOLE SODIUM 40 MG IV SOLR
40.0000 mg | INTRAVENOUS | Status: DC
  Administered 2015-04-03 – 2015-04-04 (×2): 40 mg via INTRAVENOUS
  Filled 2015-04-03 (×2): qty 40

## 2015-04-03 MED ORDER — POLYETHYLENE GLYCOL 3350 17 G PO PACK
17.0000 g | PACK | Freq: Three times a day (TID) | ORAL | Status: DC
  Administered 2015-04-03 – 2015-04-04 (×2): 17 g via ORAL
  Filled 2015-04-03 (×2): qty 1

## 2015-04-03 MED ORDER — PNEUMOCOCCAL VAC POLYVALENT 25 MCG/0.5ML IJ INJ
0.5000 mL | INJECTION | INTRAMUSCULAR | Status: AC
  Administered 2015-04-04: 0.5 mL via INTRAMUSCULAR
  Filled 2015-04-03 (×2): qty 0.5

## 2015-04-03 MED ORDER — MORPHINE SULFATE (PF) 2 MG/ML IV SOLN
1.0000 mg | INTRAVENOUS | Status: DC | PRN
  Administered 2015-04-03 – 2015-04-05 (×6): 1 mg via INTRAVENOUS
  Filled 2015-04-03 (×6): qty 1

## 2015-04-03 MED ORDER — SODIUM CHLORIDE 0.9 % IJ SOLN
3.0000 mL | Freq: Two times a day (BID) | INTRAMUSCULAR | Status: DC
  Administered 2015-04-03 – 2015-04-05 (×5): 3 mL via INTRAVENOUS

## 2015-04-03 MED ORDER — ACETAMINOPHEN 325 MG PO TABS
650.0000 mg | ORAL_TABLET | Freq: Four times a day (QID) | ORAL | Status: DC | PRN
  Administered 2015-04-03 – 2015-04-04 (×2): 650 mg via ORAL
  Filled 2015-04-03 (×2): qty 2

## 2015-04-03 MED ORDER — HYDRALAZINE HCL 20 MG/ML IJ SOLN: 10.0000 mg | Freq: Three times a day (TID) | INTRAMUSCULAR | Status: DC | PRN

## 2015-04-03 MED ORDER — DESMOPRESSIN ACETATE 0.2 MG PO TABS
0.4000 mg | ORAL_TABLET | Freq: Every day | ORAL | Status: DC
  Administered 2015-04-03 – 2015-04-04 (×2): 0.4 mg via ORAL
  Filled 2015-04-03 (×3): qty 2

## 2015-04-03 MED ORDER — SODIUM CHLORIDE 0.9 % IV SOLN
INTRAVENOUS | Status: DC
  Administered 2015-04-03 – 2015-04-04 (×2): via INTRAVENOUS

## 2015-04-03 MED ORDER — LOSARTAN POTASSIUM 50 MG PO TABS: 50.0000 mg | ORAL_TABLET | Freq: Every day | ORAL | Status: DC

## 2015-04-03 NOTE — ED Notes (Signed)
Attempted to call report, RN  Will call Back

## 2015-04-03 NOTE — ED Notes (Signed)
Dr. Tamera Punt at the bedside checked pt.s rectum for bleed, Bright red blood noted

## 2015-04-03 NOTE — H&P (Signed)
[facility] History and Physical                                                                                    Lisa Mcclure, is a 81 y.o. female  MRN: [id number]   DOB - 07/09/1932  Admit Date - 04/03/2015  Outpatient Primary MD for the patient is [name], MD  Referring MD: Tamera Punt / ER  Consulting M.D:  Oletta Lamas / GI  PMH: Past Medical History  Diagnosis Date  . Rheumatoid arthritis(714.0)   . Unspecified essential hypertension   . Esophageal reflux   . Hypothyroidism   . Diverticulosis   . Insomnia   . OSA (obstructive sleep apnea)   . OAB (overactive bladder)   . LBP (low back pain)   . DOE (dyspnea on exertion)   . Shoulder pain, bilateral   . GERD (gastroesophageal reflux disease)   . OAB (overactive bladder)   . Depression   . Venous insufficiency   . Cardiomegaly   . Osteoarthritis       PSH: Past Surgical History  Procedure Laterality Date  . Appendectomy  1953  . Vesicovaginal fistula closure w/ tah    . Breast biopsy Right 1980  . Knee arthroscopy w/ autogenous cartilage implantation (aci) procedure Left 1994, 1995  . Knee arthroscopy Right 1996, 2010  . Shoulder surgery  1990  . Cystocele repair    . Cesarean section      1957, 1961, 1964  . Wrist surgery  1967  . Total abdominal hysterectomy  1972  . Bladder abduction-1996  1996  . Cosmetic surgery  1996     CC:  Chief Complaint  Patient presents with  . Rectal Bleeding     HPI: 81 year old female patient known to me from previous admission for frequent falls with resultant left humeral fracture and left patellar fracture. Patient was discharged to skilled nursing facility in improved to the point she was able to transfer out to assisted living. She also has history of hypertension, chronic diastolic heart failure, hypothyroidism and overactive bladder. Patient reports abrupt onset of rectal bleeding yesterday evening. She describes the stool as being soft and formed  mixed with water and blood. She's had 5 stool since onset with some clots. Had no abdominal pain and only pain is related to her left humeral fracture.  ER Evaluation and treatment: Na 132, BUN 22, Glucose 129, Hgb 11,2 Morphine 4 mg IV 1  Review of Systems   In addition to the HPI above,  No Fever-chills, myalgias or other constitutional symptoms No Headache, changes with Vision or hearing, new weakness, tingling, numbness in any extremity, No problems swallowing food or Liquids, indigestion/reflux No Chest pain, Cough or Shortness of Breath, palpitations, orthopnea or DOE No Abdominal pain, N/V; no melena or hematochezia, no dark tarry stools, Bowel movements are regular, No dysuria, hematuria or flank pain No new skin rashes, lesions, masses or bruises, No new joints pains-aches No recent weight gain or loss No polyuria, polydypsia or polyphagia,  *A full 10 point Review of Systems was done, except as stated above, all other Review of Systems were negative.  Social History Social History  Substance Use Topics  . Smoking status: Former Smoker -- 10 years    Types: Cigarettes    Quit date: 04/11/1984  . Smokeless tobacco: Not on file  . Alcohol Use: 0.0 oz/week    0 Standard drinks or equivalent per week     Comment: cocktail daily    Resides at: [facility]  Lives with: N/A  Ambulatory status: Still requiring left lower extremity knee immobilizer as well as rolling walker   Family History Family History  Problem Relation Age of Onset  . COPD Father   . Lung cancer Mother   . Breast cancer Maternal Aunt      Prior to Admission medications   Medication Sig Start Date End Date Taking? Authorizing Provider  acetaminophen (TYLENOL) 325 MG tablet Take 650 mg by mouth every 4 (four) hours as needed for mild pain.    Historical Provider, MD  aspirin 81 MG chewable tablet Chew 81 mg by mouth daily.    Historical Provider, MD  busPIRone (BUSPAR) 10 MG  tablet Take 10 mg by mouth daily. In the evening    Historical Provider, MD  busPIRone (BUSPAR) 7.5 MG tablet Take 7.5 mg by mouth 2 (two) times daily. In the morning and afternoon for anxiety    Historical Provider, MD  cholecalciferol (VITAMIN D) 1000 UNITS tablet Take 1,000 Units by mouth daily.    Historical Provider, MD  desmopressin (DDAVP) 0.2 MG tablet Take 0.4 mg by mouth at bedtime.     Historical Provider, MD  furosemide (LASIX) 80 MG tablet Take 80 mg by mouth daily. In the morning    Historical Provider, MD  hydrALAZINE (APRESOLINE) 10 MG tablet Take 10 mg by mouth 2 (two) times daily. HOLD for SBP <110    Historical Provider, MD  HYDROcodone-acetaminophen (NORCO) 7.5-325 MG tablet Take 1 tablet by mouth every 4 (four) hours as needed for moderate pain. 03/18/15   Blanchie Serve, MD  levothyroxine (SYNTHROID, LEVOTHROID) 100 MCG tablet Take 100 mcg by mouth daily before breakfast.    Historical Provider, MD  losartan (COZAAR) 100 MG tablet Take 100 mg by mouth daily.    Historical Provider, MD  Melatonin 5 MG TABS Take by mouth.    Historical Provider, MD  meloxicam (MOBIC) 15 MG tablet Take 15 mg by mouth daily.    Historical Provider, MD  metoprolol (LOPRESSOR) 50 MG tablet Take 50 mg by mouth 2 (two) times daily.    Historical Provider, MD  Multiple Vitamin (MULTI-DAY VITAMINS PO) Take by mouth.    Historical Provider, MD  nortriptyline (PAMELOR) 25 MG capsule Take 25 mg by mouth at bedtime.    Historical Provider, MD  omeprazole (PRILOSEC) 20 MG capsule Take 20 mg by mouth 2 (two) times daily before a meal.     Historical Provider, MD  polyethylene glycol (MIRALAX / GLYCOLAX) packet Take 17 g by mouth daily.     Historical Provider, MD  potassium chloride SA (K-DUR,KLOR-CON) 20 MEQ tablet Take 1 tablet (20 mEq total) by mouth daily. 02/19/15   Dorothy Spark, MD  sennosides-docusate sodium (SENOKOT-S) 8.6-50 MG tablet Take 2 tablets by mouth at bedtime.     Historical Provider, MD      No Known Allergies  Physical Exam  Vitals  Blood pressure 175/72, pulse 71, resp. rate 18, height 5\' 3"  (1.6 m), weight 215 lb (97.523 kg), SpO2 98 %.   General:  In no acute distress, appears healthy and well nourished  Psych:  Normal affect, Denies Suicidal or Homicidal ideations, Awake Alert, Oriented X 3. Speech and thought patterns are clear and appropriate, no apparent short term memory deficits  Neuro:   No focal neurological deficits, CN II through XII intact, Strength 5/5 all 4 extremities, Sensation intact all 4 extremities.  ENT:  Ears and Eyes appear Normal, Conjunctivae clear, PER. Moist oral mucosa without erythema or exudates.  Neck:  Supple, No lymphadenopathy appreciated  Respiratory:  Symmetrical chest wall movement, Good air movement bilaterally, CTAB. Room Air  Cardiac:  RRR, No Murmurs, no LE edema noted, no JVD, No carotid bruits, peripheral pulses palpable at 2+  Abdomen:  Positive bowel sounds, Soft, Non tender, Non distended,  No masses appreciated, no obvious hepatosplenomegaly  Skin:  No Cyanosis, Normal Skin Turgor, No Skin Rash or Bruise.  Extremities: Symmetrical without obvious trauma or injury,  no effusions. Left arm in sling, left lower extremity and long knee immobilizer  Data Review  CBC  Recent Labs Lab 04/03/15 1000 04/03/15 1057  WBC 8.9  --   HGB 11.2* 12.6  HCT 34.5* 37.0  PLT 331  --   MCV 92.7  --   MCH 30.1  --   MCHC 32.5  --   RDW 13.0  --   LYMPHSABS 1.3  --   MONOABS 0.7  --   EOSABS 0.3  --   BASOSABS 0.0  --     Chemistries   Recent Labs Lab 04/03/15 1000 04/03/15 1057  NA 133* 132*  K 4.2 4.0  CL 95* 92*  CO2 30  --   GLUCOSE 141* 129*  BUN 20 22*  CREATININE 0.75 0.80  CALCIUM 8.7*  --   AST 18  --   ALT 11*  --   ALKPHOS 119  --   BILITOT 0.4  --     estimated creatinine clearance is 62.3 mL/min (by C-G formula based on Cr of 0.8).  No results for input(s): TSH, T4TOTAL, T3FREE,  THYROIDAB in the last 72 hours.  Invalid input(s): FREET3  Coagulation profile No results for input(s): INR, PROTIME in the last 168 hours.  No results for input(s): DDIMER in the last 72 hours.  Cardiac Enzymes No results for input(s): CKMB, TROPONINI, MYOGLOBIN in the last 168 hours.  Invalid input(s): CK  Invalid input(s): POCBNP  Urinalysis    Component Value Date/Time   COLORURINE YELLOW 03/07/2015 [place] 03/07/2015 1631   LABSPEC 1.022 03/07/2015 1631   PHURINE 5.5 03/07/2015 1631   GLUCOSEU NEGATIVE 03/07/2015 1631   HGBUR NEGATIVE 03/07/2015 1631   BILIRUBINUR NEGATIVE 03/07/2015 1631   KETONESUR NEGATIVE 03/07/2015 1631   PROTEINUR NEGATIVE 03/07/2015 1631   UROBILINOGEN 0.2 01/06/2009 0808   NITRITE NEGATIVE 03/07/2015 1631   LEUKOCYTESUR NEGATIVE 03/07/2015 1631    Imaging results:   Dg Chest 2 View  03/06/2015  CLINICAL DATA:  Fall. Recent shoulder fracture. Anterior lower chest wall pain. EXAM: CHEST  2 VIEW COMPARISON:  08/09/2010 chest radiograph. FINDINGS: Stable cardiomediastinal silhouette with top-normal heart size. No pneumothorax. No pleural effusion. No pulmonary edema. Mild curvilinear opacities are present at both lung bases. No displaced fracture in the visualized chest. Severe osteoarthritis in the bilateral glenohumeral joints. IMPRESSION: Mild curvilinear opacities at both lung bases, favor scarring or atelectasis. Electronically Signed   By: Ilona Sorrel M.D.   On: 03/06/2015 12:09   Dg Wrist Complete Left  03/06/2015  CLINICAL DATA:  Tripped and fell today. Initial encounter. The patient  fell to her knees. Burning and pain in the left wrist. Personal history of left wrist fracture 2 years ago. EXAM: LEFT WRIST - COMPLETE 3+ VIEW COMPARISON:  Left wrist radiographs 02/22/2013 FINDINGS: Moderate osteopenia is present. Previous fracture has healed. No definite acute fracture is evident. Degenerative changes extend to the DRUJ.  IMPRESSION: 1. No acute abnormality. 2. Remote fractures have healed. 3. Moderate osteopenia. Electronically Signed   By: San Morelle M.D.   On: 03/06/2015 12:10   Ct Head Wo Contrast  03/06/2015  CLINICAL DATA:  Multiple falls last week. EXAM: CT HEAD WITHOUT CONTRAST TECHNIQUE: Contiguous axial images were obtained from the base of the skull through the vertex without intravenous contrast. COMPARISON:  None. FINDINGS: Ventricles, cisterns and other CSF spaces are within normal. There is no mass, mass effect, shift of midline structures or acute hemorrhage. No evidence of acute infarction. Minimal degenerative change of the temporomandibular joints. Remaining bones and soft tissues are within normal. IMPRESSION: No acute intracranial findings. Electronically Signed   By: Marin Olp M.D.   On: 03/06/2015 16:53   Ct Angio Chest Pe W/cm &/or Wo Cm  03/09/2015  CLINICAL DATA:  Shortness of breath starting this morning. Recent shoulder fracture. EXAM: CT ANGIOGRAPHY CHEST WITH CONTRAST TECHNIQUE: Multidetector CT imaging of the chest was performed using the standard protocol during bolus administration of intravenous contrast. Multiplanar CT image reconstructions and MIPs were obtained to evaluate the vascular anatomy. CONTRAST:  80mL OMNIPAQUE IOHEXOL 350 MG/ML SOLN COMPARISON:  03/07/2015 shoulder CT. Chest radiograph 03/06/2015. Most recent chest CT of 01/17/2011. FINDINGS: Mediastinum/Nodes: The quality of this exam for evaluation of pulmonary embolism is poor to moderate. The bolus is suboptimally timed, with contrast centered in the SVC. This is in addition to degradation secondary to mild motion and patient body habitus. No evidence of pulmonary embolism to the lobar level within the upper lobes. No large segmental embolism identified at the lung bases. Mild pulmonary artery enlargement, including a 2.9 cm left pulmonary artery. Moderate cardiomegaly with coronary artery atherosclerosis,  including within likely left main and LAD. Lipomatous hypertrophy of the interatrial septum. No mediastinal or hilar adenopathy. Lungs/Pleura: Left greater than right trace pleural thickening. No pleural fluid. Mild motion degradation. Right base scarring. Upper abdomen: Normal imaged portions of the liver, spleen, stomach, pancreas, adrenal glands, kidneys. Musculoskeletal: Advanced left glenohumeral joint osteoarthritis. Proximal left humerus fracture, suboptimally evaluated. Review of the MIP images confirms the above findings. IMPRESSION: 1. Suboptimal exam, secondary to bolus timing, patient size, motion artifact. No evidence of pulmonary embolism with limitations above. 2. Cardiomegaly with atherosclerosis in the coronary arteries. 3. Pulmonary artery enlargement suggests pulmonary arterial hypertension. Electronically Signed   By: Abigail Miyamoto M.D.   On: 03/09/2015 14:28   Mr Brain Wo Contrast  03/07/2015  CLINICAL DATA:  Gait abnormality.  Multiple recent falls. EXAM: MRI HEAD WITHOUT CONTRAST TECHNIQUE: Multiplanar, multiecho pulse sequences of the brain and surrounding structures were obtained without intravenous contrast. COMPARISON:  Head CT 03/06/2015 FINDINGS: Mild motion artifact. There is no evidence of acute infarct, intracranial hemorrhage, mass, midline shift, or extra-axial fluid collection. Mild cerebral atrophy is within normal limits for age. Small foci of T2 hyperintensity in the subcortical and deep cerebral white matter and pons are nonspecific but compatible with mild chronic small vessel ischemic disease. Orbits are unremarkable. Paranasal sinuses and mastoid air cells are clear. Major intracranial vascular flow voids are preserved. IMPRESSION: 1. No acute intracranial abnormality. 2. Mild chronic small vessel  ischemic disease. Electronically Signed   By: Logan Bores M.D.   On: 03/07/2015 12:49   Ct Shoulder Left Wo Contrast  03/07/2015  CLINICAL DATA:  Recurrent falls with left  shoulder pain. Question fracture. Initial encounter. EXAM: CT OF THE LEFT SHOULDER WITHOUT CONTRAST TECHNIQUE: Multidetector CT imaging was performed according to the standard protocol. Multiplanar CT image reconstructions were also generated. COMPARISON:  Plain films left shoulder 03/06/2015. FINDINGS: The patient has a fracture of the proximal humerus which shows mild lateral displacement of approximately 0.8 cm. The fracture extends from the posterior, inferior margin of the humeral head in an anterior and inferior orientation through the surgical neck. Small cortical break is also seen in the posterior surgical neck. The fracture does not appear to involve the greater or lesser tuberosities. No other fracture is identified. The acromioclavicular joint is intact. There is severe glenohumeral osteoarthritis with marked joint space narrowing, osteophytosis and remodeling of the glenoid. Moderate acromioclavicular degenerative change is seen. Musculature about the shoulder girdle is somewhat atrophied. No rotator cuff tear is visualized on this exam. Imaged lung parenchyma is clear. IMPRESSION: Acute fracture of the proximal humerus shows mild lateral displacement but does not appear to involve the greater or lesser tuberosities. Severe glenohumeral osteoarthritis. Moderate acromioclavicular degenerative change. Electronically Signed   By: Inge Rise M.D.   On: 03/07/2015 11:31   Nm Pulmonary Perf And Vent  03/10/2015  CLINICAL DATA:  Shortness of breath and chest pain for 1 day EXAM: NUCLEAR MEDICINE VENTILATION - PERFUSION LUNG SCAN Views: Anterior, posterior, LPO, RPO, LAO, RAO -ventilation and perfusion Radionuclide: Technetium 37m DTPA -ventilation ; Technetium 56m macroaggregated albumin-perfusion Dose:  [id number] millicurie-ventilation; 4.3 millicurie-perfusion Route of administration: Inhalation -ventilation ; intravenous -perfusion COMPARISON:  Chest CT March 09, 2015 FINDINGS: Ventilation:  Radiotracer uptake is homogeneous and symmetric bilaterally. No ventilation defects are appreciable. Perfusion: Radiotracer uptake is homogeneous is symmetric bilaterally. No perfusion defects are appreciable. IMPRESSION: No appreciable ventilation or perfusion defects. This study constitutes a very low probability of pulmonary embolus. Electronically Signed   By: Lowella Grip III M.D.   On: 03/10/2015 10:11   Dg Shoulder Left  03/06/2015  CLINICAL DATA:  Initial encounter for Pt tripped and fell this morning, she went down and hit both of her knees, then fell and landed on her left side. Known Left shoulder Fx from 2 weeks ago, and she has a previous left wrist Fx from 2014. She has bilateral knee p.*comment was truncated* EXAM: LEFT SHOULDER - 2+ VIEW COMPARISON:  None. FINDINGS: Osteopenia. Visualized portion of the left hemithorax is normal. Advanced glenohumeral joint osteoarthritis. Undersurface acromioclavicular joint degenerative change. High-riding humeral head consistent with chronic rotator cuff insufficiency. No acute fracture or dislocation. IMPRESSION: Advanced osteoarthritis.  No acute findings. Electronically Signed   By: Abigail Miyamoto M.D.   On: 03/06/2015 12:09   Dg Knee Complete 4 Views Left  03/06/2015  CLINICAL DATA:  Knee pain secondary to a fall this morning. EXAM: LEFT KNEE - COMPLETE 4+ VIEW COMPARISON:  None. FINDINGS: There is a nondisplaced fracture of the mid patella. There is a small joint effusion. The patient has had a hemiarthroplasty in the medial compartment. There are slight degenerative changes in the patellofemoral compartment and in the lateral compartment with chondromalacia in the lateral compartment. There is slight irregularity of the lateral tibial plateau beneath the prosthesis. IMPRESSION: Acute nondisplaced fracture of the mid patella. Electronically Signed   By: Lorriane Shire M.D.   On:  03/06/2015 12:09   Dg Knee Complete 4 Views Right  03/06/2015   CLINICAL DATA:  Initial encounter for Pt tripped and fell this morning, she went down and hit both of her knees, then fell and landed on her left side. Known Left shoulder Fx from 2 weeks ago, and she has a previous left wrist Fx from 2014. She has bilateral knee p.*comment was truncated* EXAM: RIGHT KNEE - COMPLETE 4+ VIEW COMPARISON:  None. FINDINGS: Knee arthroplasty. No acute hardware complication. No joint effusion. No acute fracture or dislocation. IMPRESSION: Expected appearance after knee arthroplasty. Electronically Signed   By: Abigail Miyamoto M.D.   On: 03/06/2015 12:07   Dg Hips Bilat With Pelvis 3-4 Views  03/06/2015  CLINICAL DATA:  Fall.  Initial evaluation. EXAM: DG HIP (WITH OR WITHOUT PELVIS) 3-4V BILAT COMPARISON:  No prior exam for comparison. FINDINGS: Diffuse osteopenia degenerative change. No acute abnormality. No evidence of fracture dislocation. IMPRESSION: Diffuse osteopenia degenerative change.  No acute abnormality. Electronically Signed   By: Marcello Moores  Register   On: 03/06/2015 12:07      Assessment & Plan  Principal Problem:   Rectal bleeding -SDU -Hgb stable and low volume rectal bleeding -GI eval pending -clears -CBC q 12hrs -As precaution hold aspirin and Mobitz -Doubt upper GI etiology but will replace preadmission Prilosec or Protonix IV  Active Problems:   Hypertension -Moderate control -Decreased dosage of Lopressor and Cozaar to 50% of preadmission dosage given potential for progression of GI bleeding -Holding hydralazine    Chronic diastolic heart failure, NYHA class 1  -Compensated -Patient reports Lasix dosage is now 80 mg daily -Hold Lasix initially until assured bleeding does not worsen and no progression in anemia    Left humeral fracture/Patellar fracture -IV Morphine prn -Continue left upper extremity sling and left lower extremity immobilized -PT/OT evaluations    OSA     Hypothyroid -Synthroid    DVT Prophylaxis: SCDs  Family  Communication: Son at bedside  Code Status:  Full code  Condition:  Stable  Discharge disposition: When medically stable anticipate return back to assisted living facility  Time spent in minutes : 60      ELLIS,ALLISON L. ANP on 04/03/2015 at 12:02 PM  You may contact me by going to [email or web address] - password [email or web address]  I am available from 7a-7p but please confirm I am on the schedule by going to Amion as above.   After 7p please contact night coverage person covering me after hours  [facility]

## 2015-04-03 NOTE — Consult Note (Signed)
[facility] CONSULT Reason for consult: G.I. bleeding Referring Physician: [facility] hospitalist. PCP: Dr. Harland Dingwall Lisa Mcclure is an 81 y.o. female.  HPI: She has a history of Gerd venous insufficiency cardiomegaly arthritis reflux etc. She's known to have diverticulosis. She has a fractured kneecap and a fractured shoulder that occurred on 2 different falls requiring some time in SNF but now she has moved over to assisted living in hopes to go back to her own home. She had the sudden onset yesterday bright red blood per rectum states that it was mixed with the stool. She has been taking narcotics for her fractures and this is required her to take MiraLAX to avoid being constipated. She has had several stools with clots. Hemoglobin has remained steady at 11 or over. Her BUN is only slightly up. She denies any prior hematochezia or melenic stools  Past Medical History  Diagnosis Date  . Rheumatoid arthritis(714.0)   . Unspecified essential hypertension   . Esophageal reflux   . Hypothyroidism   . Diverticulosis   . Insomnia   . OSA (obstructive sleep apnea)   . OAB (overactive bladder)   . LBP (low back pain)   . DOE (dyspnea on exertion)   . Shoulder pain, bilateral   . GERD (gastroesophageal reflux disease)   . OAB (overactive bladder)   . Depression   . Venous insufficiency   . Cardiomegaly   . Osteoarthritis     Past Surgical History  Procedure Laterality Date  . Appendectomy  1953  . Vesicovaginal fistula closure w/ tah    . Breast biopsy Right 1980  . Knee arthroscopy w/ autogenous cartilage implantation (aci) procedure Left 1994, 1995  . Knee arthroscopy Right 1996, 2010  . Shoulder surgery  1990  . Cystocele repair    . Cesarean section      1957, 1961, 1964  . Wrist surgery  1967  . Total abdominal hysterectomy  1972  . Bladder abduction-1996  1996  . Cosmetic surgery  1996    Family History  Problem Relation Age of Onset  . COPD Father   .  Lung cancer Mother   . Breast cancer Maternal Aunt     Social History:  reports that she quit smoking about 30 years ago. Her smoking use included Cigarettes. She quit after 10 years of use. She does not have any smokeless tobacco history on file. She reports that she drinks alcohol. She reports that she does not use illicit drugs.  Allergies: No Known Allergies  Medications; Prior to Admission medications   Medication Sig Start Date End Date Taking? Authorizing Provider  acetaminophen (TYLENOL) 325 MG tablet Take 650 mg by mouth every 4 (four) hours as needed for mild pain.   Yes Historical Provider, MD  aspirin 81 MG chewable tablet Chew 81 mg by mouth daily.   Yes Historical Provider, MD  busPIRone (BUSPAR) 10 MG tablet Take 10 mg by mouth at bedtime. In the evening   Yes Historical Provider, MD  busPIRone (BUSPAR) 7.5 MG tablet Take 7.5 mg by mouth 2 (two) times daily. In the morning and afternoon for anxiety   Yes Historical Provider, MD  cholecalciferol (VITAMIN D) 1000 UNITS tablet Take 1,000 Units by mouth daily.   Yes Historical Provider, MD  desmopressin (DDAVP) 0.2 MG tablet Take 0.4 mg by mouth at bedtime.    Yes Historical Provider, MD  furosemide (LASIX) 80 MG tablet Take 80 mg by mouth daily. In the morning  Yes Historical Provider, MD  hydrALAZINE (APRESOLINE) 10 MG tablet Take 10 mg by mouth 2 (two) times daily. HOLD for SBP <110   Yes Historical Provider, MD  HYDROcodone-acetaminophen (NORCO) 7.5-325 MG tablet Take 1 tablet by mouth every 4 (four) hours as needed for moderate pain. 03/18/15  Yes [name], MD  levothyroxine (SYNTHROID, LEVOTHROID) 100 MCG tablet Take 100 mcg by mouth daily before breakfast.   Yes Historical Provider, MD  losartan (COZAAR) 100 MG tablet Take 100 mg by mouth daily.   Yes Historical Provider, MD  Melatonin 5 MG TABS Take 5 mg by mouth at bedtime.    Yes Historical Provider, MD  meloxicam (MOBIC) 15 MG tablet Take 15 mg by mouth daily.   Yes  Historical Provider, MD  metoprolol (LOPRESSOR) 50 MG tablet Take 50 mg by mouth 2 (two) times daily.   Yes Historical Provider, MD  Multiple Vitamin (MULTI-DAY VITAMINS PO) Take 1 tablet by mouth daily.    Yes Historical Provider, MD  mupirocin ointment (BACTROBAN) 2 % Place 1 application into the nose daily.   Yes Historical Provider, MD  nortriptyline (PAMELOR) 25 MG capsule Take 25 mg by mouth at bedtime.   Yes Historical Provider, MD  omeprazole (PRILOSEC) 20 MG capsule Take 20 mg by mouth 2 (two) times daily before a meal.    Yes Historical Provider, MD  polyethylene glycol (MIRALAX / GLYCOLAX) packet Take 17 g by mouth daily.    Yes Historical Provider, MD  potassium chloride SA (K-DUR,KLOR-CON) 20 MEQ tablet Take 1 tablet (20 mEq total) by mouth daily. 02/19/15  Yes Dorothy Spark, MD  sennosides-docusate sodium (SENOKOT-S) 8.6-50 MG tablet Take 1 tablet by mouth daily as needed for constipation.    Yes Historical Provider, MD   . desmopressin  0.4 mg Oral QHS  . metoprolol tartrate  25 mg Oral BID  . pantoprazole (PROTONIX) IV  40 mg Intravenous Q24H  . sodium chloride  3 mL Intravenous Q12H   PRN Meds acetaminophen **OR** acetaminophen, hydrALAZINE, morphine injection Results for orders placed or performed during the hospital encounter of 04/03/15 (from the past 48 hour(s))  Comprehensive metabolic panel     Status: Abnormal   Collection Time: 04/03/15 10:00 AM  Result Value Ref Range   Sodium 133 (L) 135 - 145 mmol/L   Potassium 4.2 3.5 - 5.1 mmol/L   Chloride 95 (L) 101 - 111 mmol/L   CO2 30 22 - 32 mmol/L   Glucose, Bld 141 (H) 65 - 99 mg/dL   BUN 20 6 - 20 mg/dL   Creatinine, Ser 0.75 0.44 - 1.00 mg/dL   Calcium 8.7 (L) 8.9 - 10.3 mg/dL   Total Protein 6.4 (L) 6.5 - 8.1 g/dL   Albumin 3.2 (L) 3.5 - 5.0 g/dL   AST 18 15 - 41 U/L   ALT 11 (L) 14 - 54 U/L   Alkaline Phosphatase 119 38 - 126 U/L   Total Bilirubin 0.4 0.3 - 1.2 mg/dL   GFR calc non Af Amer >60 >60  mL/min   GFR calc Af Amer >60 >60 mL/min    Comment: (NOTE) The eGFR has been calculated using the CKD EPI equation. This calculation has not been validated in all clinical situations. eGFR's persistently <60 mL/min signify possible Chronic Kidney Disease.    Anion gap 8 5 - 15  CBC with Differential     Status: Abnormal   Collection Time: 04/03/15 10:00 AM  Result Value Ref Range  WBC 8.9 4.0 - 10.5 K/uL   RBC 3.72 (L) 3.87 - 5.11 MIL/uL   Hemoglobin 11.2 (L) 12.0 - 15.0 g/dL   HCT 34.5 (L) 36.0 - 46.0 %   MCV 92.7 78.0 - 100.0 fL   MCH 30.1 26.0 - 34.0 pg   MCHC 32.5 30.0 - 36.0 g/dL   RDW 13.0 11.5 - 15.5 %   Platelets 331 150 - 400 K/uL   Neutrophils Relative % 74 %   Neutro Abs 6.6 1.7 - 7.7 K/uL   Lymphocytes Relative 15 %   Lymphs Abs 1.3 0.7 - 4.0 K/uL   Monocytes Relative 8 %   Monocytes Absolute 0.7 0.1 - 1.0 K/uL   Eosinophils Relative 3 %   Eosinophils Absolute 0.3 0.0 - 0.7 K/uL   Basophils Relative 0 %   Basophils Absolute 0.0 0.0 - 0.1 K/uL  Type and screen [place]     Status: None   Collection Time: 04/03/15 10:00 AM  Result Value Ref Range   ABO/RH(D) B POS    Antibody Screen NEG    Sample Expiration 04/06/2015   ABO/Rh     Status: None   Collection Time: 04/03/15 10:00 AM  Result Value Ref Range   ABO/RH(D) B POS   I-stat Chem 8, ED     Status: Abnormal   Collection Time: 04/03/15 10:57 AM  Result Value Ref Range   Sodium 132 (L) 135 - 145 mmol/L   Potassium 4.0 3.5 - 5.1 mmol/L   Chloride 92 (L) 101 - 111 mmol/L   BUN 22 (H) 6 - 20 mg/dL   Creatinine, Ser 0.80 0.44 - 1.00 mg/dL   Glucose, Bld 129 (H) 65 - 99 mg/dL   Calcium, Ion 1.15 1.13 - 1.30 mmol/L   TCO2 33 0 - 100 mmol/L   Hemoglobin 12.6 12.0 - 15.0 g/dL   HCT 37.0 36.0 - 46.0 %  MRSA PCR Screening     Status: None   Collection Time: 04/03/15  2:37 PM  Result Value Ref Range   MRSA by PCR NEGATIVE NEGATIVE    Comment:        The GeneXpert MRSA Assay  (FDA approved for NASAL specimens only), is one component of a comprehensive MRSA colonization surveillance program. It is not intended to diagnose MRSA infection nor to guide or monitor treatment for MRSA infections.   CBC     Status: Abnormal   Collection Time: 04/03/15  4:00 PM  Result Value Ref Range   WBC 10.0 4.0 - 10.5 K/uL   RBC 3.46 (L) 3.87 - 5.11 MIL/uL   Hemoglobin 10.7 (L) 12.0 - 15.0 g/dL   HCT 32.0 (L) 36.0 - 46.0 %   MCV 92.5 78.0 - 100.0 fL   MCH 30.9 26.0 - 34.0 pg   MCHC 33.4 30.0 - 36.0 g/dL   RDW 13.0 11.5 - 15.5 %   Platelets 312 150 - 400 K/uL    No results found. ROS: see other H&P             Blood pressure 159/75, pulse 63, resp. rate 18, height 5' 3" (1.6 m), weight 97.523 kg (215 lb), SpO2 95 %.  Physical exam:   General-- a pleasant white female who does have her left arm in a sling ENT-- nonicteric Neck-- no lymphadenopathy Heart-- regular rate and rhythm without murmurs or gallops Lungs-- clear Abdomen-- soft completely nontender Psych-- alert and oriented, appropriate   Assessment: 1. Lower G.I. bleed. This  is probably diverticular bleed. She had a colonoscopy about 10 years ago and had diverticulosis. At her age she does not wish another colonoscopy unless absolutely needed. 2. Fracture left shoulder and right patella  Plan: place on clear liquid diet and give her Miralax and see if this clears up. Her hemoglobin remains normal and there is no sign that she is continuing to have active bleeding. If it does clear up she probably could go home with follow-up is outpatient.   Demarko Zeimet JR,Braxley Balandran L 04/03/2015, 4:44 PM   Pager: [phone] If no answer or after hours call [phone]

## 2015-04-03 NOTE — ED Provider Notes (Signed)
CSN: [id number]     Arrival date & time 04/03/15  [id number] History   First MD Initiated Contact with Patient 04/03/15 0935     Chief Complaint  Patient presents with  . Rectal Bleeding     (Consider location/radiation/quality/duration/timing/severity/associated sxs/prior Treatment) HPI Comments: Patient presents with rectal bleeding. She's currently living in assisted living facility because she had a recent fracture of her patella and left shoulder. She states yesterday she noted some blood in the stool and today she's had large amounts of blood filling the toilet. She's had at least 3 episodes prior to arrival. She denies any dizziness. No nausea or vomiting. No abdominal pain. She does have a history of diverticulosis. She denies any history of GI bleeds in the past.  She's had one past colonoscopy in 2001, by Dr. Lajoyce Corners. Other than that, she has not seen a GI doctor in the past.  Patient is a 81 y.o. female presenting with hematochezia.  Rectal Bleeding Associated symptoms: no abdominal pain, no dizziness, no fever and no vomiting     Past Medical History  Diagnosis Date  . Rheumatoid arthritis(714.0)   . Unspecified essential hypertension   . Esophageal reflux   . Hypothyroidism   . Diverticulosis   . Insomnia   . OSA (obstructive sleep apnea)   . OAB (overactive bladder)   . LBP (low back pain)   . DOE (dyspnea on exertion)   . Shoulder pain, bilateral   . GERD (gastroesophageal reflux disease)   . OAB (overactive bladder)   . Depression   . Venous insufficiency   . Cardiomegaly   . Osteoarthritis    Past Surgical History  Procedure Laterality Date  . Appendectomy  1953  . Vesicovaginal fistula closure w/ tah    . Breast biopsy Right 1980  . Knee arthroscopy w/ autogenous cartilage implantation (aci) procedure Left 1994, 1995  . Knee arthroscopy Right 1996, 2010  . Shoulder surgery  1990  . Cystocele repair    . Cesarean section      1957, 1961, 1964  . Wrist surgery   1967  . Total abdominal hysterectomy  1972  . Bladder abduction-1996  1996  . Cosmetic surgery  1996   Family History  Problem Relation Age of Onset  . COPD Father   . Lung cancer Mother   . Breast cancer Maternal Aunt    Social History  Substance Use Topics  . Smoking status: Former Smoker -- 10 years    Types: Cigarettes    Quit date: 04/11/1984  . Smokeless tobacco: None  . Alcohol Use: 0.0 oz/week    0 Standard drinks or equivalent per week     Comment: cocktail daily   OB History    No data available     Review of Systems  Constitutional: Negative for fever, chills, diaphoresis and fatigue.  HENT: Negative for congestion, rhinorrhea and sneezing.   Eyes: Negative.   Respiratory: Negative for cough, chest tightness and shortness of breath.   Cardiovascular: Negative for chest pain and leg swelling.  Gastrointestinal: Positive for blood in stool and hematochezia. Negative for nausea, vomiting, abdominal pain and diarrhea.  Genitourinary: Negative for frequency, hematuria, flank pain and difficulty urinating.  Musculoskeletal: Negative for back pain and arthralgias.  Skin: Negative for rash.  Neurological: Negative for dizziness, speech difficulty, weakness, numbness and headaches.      Allergies  Review of patient's allergies indicates no known allergies.  Home Medications   Prior to Admission medications  Medication Sig Start Date End Date Taking? Authorizing Provider  acetaminophen (TYLENOL) 325 MG tablet Take 650 mg by mouth every 4 (four) hours as needed for mild pain.    Historical Provider, MD  aspirin 81 MG chewable tablet Chew 81 mg by mouth daily.    Historical Provider, MD  busPIRone (BUSPAR) 10 MG tablet Take 10 mg by mouth daily. In the evening    Historical Provider, MD  busPIRone (BUSPAR) 7.5 MG tablet Take 7.5 mg by mouth 2 (two) times daily. In the morning and afternoon for anxiety    Historical Provider, MD  cholecalciferol (VITAMIN D) 1000  UNITS tablet Take 1,000 Units by mouth daily.    Historical Provider, MD  desmopressin (DDAVP) 0.2 MG tablet Take 0.4 mg by mouth at bedtime.     Historical Provider, MD  furosemide (LASIX) 80 MG tablet Take 80 mg by mouth daily. In the morning    Historical Provider, MD  hydrALAZINE (APRESOLINE) 10 MG tablet Take 10 mg by mouth 2 (two) times daily. HOLD for SBP <110    Historical Provider, MD  HYDROcodone-acetaminophen (NORCO) 7.5-325 MG tablet Take 1 tablet by mouth every 4 (four) hours as needed for moderate pain. 03/18/15   Blanchie Serve, MD  levothyroxine (SYNTHROID, LEVOTHROID) 100 MCG tablet Take 100 mcg by mouth daily before breakfast.    Historical Provider, MD  losartan (COZAAR) 100 MG tablet Take 100 mg by mouth daily.    Historical Provider, MD  Melatonin 5 MG TABS Take by mouth.    Historical Provider, MD  meloxicam (MOBIC) 15 MG tablet Take 15 mg by mouth daily.    Historical Provider, MD  metoprolol (LOPRESSOR) 50 MG tablet Take 50 mg by mouth 2 (two) times daily.    Historical Provider, MD  Multiple Vitamin (MULTI-DAY VITAMINS PO) Take by mouth.    Historical Provider, MD  nortriptyline (PAMELOR) 25 MG capsule Take 25 mg by mouth at bedtime.    Historical Provider, MD  omeprazole (PRILOSEC) 20 MG capsule Take 20 mg by mouth 2 (two) times daily before a meal.     Historical Provider, MD  polyethylene glycol (MIRALAX / GLYCOLAX) packet Take 17 g by mouth daily.     Historical Provider, MD  potassium chloride SA (K-DUR,KLOR-CON) 20 MEQ tablet Take 1 tablet (20 mEq total) by mouth daily. 02/19/15   Dorothy Spark, MD  sennosides-docusate sodium (SENOKOT-S) 8.6-50 MG tablet Take 2 tablets by mouth at bedtime.     Historical Provider, MD   BP 175/72 mmHg  Pulse 71  Resp 18  Ht 5\' 3"  (1.6 m)  Wt 215 lb (97.523 kg)  BMI 38.09 kg/m2  SpO2 98% Physical Exam  Constitutional: She is oriented to person, place, and time. She appears well-developed and well-nourished.  HENT:  Head:  Normocephalic and atraumatic.  Eyes: Pupils are equal, round, and reactive to light.  Neck: Normal range of motion. Neck supple.  Cardiovascular: Normal rate, regular rhythm and normal heart sounds.   Pulmonary/Chest: Effort normal and breath sounds normal. No respiratory distress. She has no wheezes. She has no rales. She exhibits no tenderness.  Abdominal: Soft. Bowel sounds are normal. There is no tenderness. There is no rebound and no guarding.  Genitourinary:  Patient has blood around her anus. Rectal exam shows maroon colored blood with a small amount of stool.  Musculoskeletal: Normal range of motion. She exhibits no edema.  Lymphadenopathy:    She has no cervical adenopathy.  Neurological: She is  alert and oriented to person, place, and time.  Skin: Skin is warm and dry. No rash noted.  Psychiatric: She has a normal mood and affect.    ED Course  Procedures (including critical care time) Labs Review Labs Reviewed  COMPREHENSIVE METABOLIC PANEL - Abnormal; Notable for the following:    Sodium 133 (*)    Chloride 95 (*)    Glucose, Bld 141 (*)    Calcium 8.7 (*)    Total Protein 6.4 (*)    Albumin 3.2 (*)    ALT 11 (*)    All other components within normal limits  CBC WITH DIFFERENTIAL/PLATELET - Abnormal; Notable for the following:    RBC 3.72 (*)    Hemoglobin 11.2 (*)    HCT 34.5 (*)    All other components within normal limits  I-STAT CHEM 8, ED - Abnormal; Notable for the following:    Sodium 132 (*)    Chloride 92 (*)    BUN 22 (*)    Glucose, Bld 129 (*)    All other components within normal limits  CBC  TYPE AND SCREEN  ABO/RH    Imaging Review No results found. I have personally reviewed and evaluated these images and lab results as part of my medical decision-making.   EKG Interpretation None      MDM   Final diagnoses:  Gastrointestinal hemorrhage, unspecified gastritis, unspecified gastrointestinal hemorrhage type    Patient having large  amounts of maroon colored stool. She is hemodynamically stable. Her hemoglobin is 11.3. I spoke with Dr. Oletta Lamas with [facility] gastroenterology who will see the patient. I also spoke with the nurse practitioner with the hospitalist service who will admit the patient for Dr. Adah Salvage, MD 04/03/15 1149

## 2015-04-03 NOTE — ED Notes (Signed)
Pt. Does not want the Morphine would prefer Vicodin.  Morphine made her crazy, stated by pt.

## 2015-04-03 NOTE — ED Notes (Signed)
Pt. Lives at [facility] assisted Living and had an episode of diarrhea with red blood last night and 3 episodes this am.  Pt. Just recently broke her lt. Shoulder lt. Knee cap. Pt. Denies any abdominal pain.    Pt. Denies any n/v  Pt. Denies any chest pain.  She is taking pain medication for her injuries.  She also is HTN and she is has HTN and did not take her medication this am. Pt. Is alert and oriented X4.

## 2015-04-04 DIAGNOSIS — G4700 Insomnia, unspecified: Secondary | ICD-10-CM | POA: Diagnosis present

## 2015-04-04 DIAGNOSIS — W010XXD Fall on same level from slipping, tripping and stumbling without subsequent striking against object, subsequent encounter: Secondary | ICD-10-CM | POA: Diagnosis present

## 2015-04-04 DIAGNOSIS — K5791 Diverticulosis of intestine, part unspecified, without perforation or abscess with bleeding: Secondary | ICD-10-CM | POA: Diagnosis present

## 2015-04-04 DIAGNOSIS — S42202D Unspecified fracture of upper end of left humerus, subsequent encounter for fracture with routine healing: Secondary | ICD-10-CM | POA: Diagnosis not present

## 2015-04-04 DIAGNOSIS — K625 Hemorrhage of anus and rectum: Secondary | ICD-10-CM | POA: Diagnosis not present

## 2015-04-04 DIAGNOSIS — G4733 Obstructive sleep apnea (adult) (pediatric): Secondary | ICD-10-CM | POA: Diagnosis present

## 2015-04-04 DIAGNOSIS — N3281 Overactive bladder: Secondary | ICD-10-CM | POA: Diagnosis present

## 2015-04-04 DIAGNOSIS — I110 Hypertensive heart disease with heart failure: Secondary | ICD-10-CM | POA: Diagnosis present

## 2015-04-04 DIAGNOSIS — I872 Venous insufficiency (chronic) (peripheral): Secondary | ICD-10-CM | POA: Diagnosis present

## 2015-04-04 DIAGNOSIS — M1990 Unspecified osteoarthritis, unspecified site: Secondary | ICD-10-CM | POA: Diagnosis present

## 2015-04-04 DIAGNOSIS — F329 Major depressive disorder, single episode, unspecified: Secondary | ICD-10-CM | POA: Diagnosis present

## 2015-04-04 DIAGNOSIS — F419 Anxiety disorder, unspecified: Secondary | ICD-10-CM | POA: Diagnosis present

## 2015-04-04 DIAGNOSIS — E039 Hypothyroidism, unspecified: Secondary | ICD-10-CM | POA: Diagnosis present

## 2015-04-04 DIAGNOSIS — K219 Gastro-esophageal reflux disease without esophagitis: Secondary | ICD-10-CM | POA: Diagnosis present

## 2015-04-04 DIAGNOSIS — K922 Gastrointestinal hemorrhage, unspecified: Secondary | ICD-10-CM | POA: Diagnosis not present

## 2015-04-04 DIAGNOSIS — S82002D Unspecified fracture of left patella, subsequent encounter for closed fracture with routine healing: Secondary | ICD-10-CM | POA: Diagnosis not present

## 2015-04-04 DIAGNOSIS — I5032 Chronic diastolic (congestive) heart failure: Secondary | ICD-10-CM | POA: Diagnosis present

## 2015-04-04 DIAGNOSIS — Z79899 Other long term (current) drug therapy: Secondary | ICD-10-CM | POA: Diagnosis not present

## 2015-04-04 DIAGNOSIS — R296 Repeated falls: Secondary | ICD-10-CM | POA: Diagnosis present

## 2015-04-04 DIAGNOSIS — Z87891 Personal history of nicotine dependence: Secondary | ICD-10-CM | POA: Diagnosis not present

## 2015-04-04 DIAGNOSIS — Z7982 Long term (current) use of aspirin: Secondary | ICD-10-CM | POA: Diagnosis not present

## 2015-04-04 DIAGNOSIS — Z23 Encounter for immunization: Secondary | ICD-10-CM | POA: Diagnosis not present

## 2015-04-04 DIAGNOSIS — K2970 Gastritis, unspecified, without bleeding: Secondary | ICD-10-CM | POA: Diagnosis present

## 2015-04-04 DIAGNOSIS — M069 Rheumatoid arthritis, unspecified: Secondary | ICD-10-CM | POA: Diagnosis present

## 2015-04-04 LAB — CBC
HEMATOCRIT: 27.5 % — AB (ref 36.0–46.0)
HEMOGLOBIN: 9 g/dL — AB (ref 12.0–15.0)
MCH: 30.4 pg (ref 26.0–34.0)
MCHC: 32.7 g/dL (ref 30.0–36.0)
MCV: 92.9 fL (ref 78.0–100.0)
Platelets: 277 10*3/uL (ref 150–400)
RBC: 2.96 MIL/uL — ABNORMAL LOW (ref 3.87–5.11)
RDW: 13.1 % (ref 11.5–15.5)
WBC: 7.2 10*3/uL (ref 4.0–10.5)

## 2015-04-04 LAB — COMPREHENSIVE METABOLIC PANEL
ALBUMIN: 2.7 g/dL — AB (ref 3.5–5.0)
ALT: 10 U/L — ABNORMAL LOW (ref 14–54)
AST: 17 U/L (ref 15–41)
Alkaline Phosphatase: 87 U/L (ref 38–126)
Anion gap: 8 (ref 5–15)
BILIRUBIN TOTAL: 0.3 mg/dL (ref 0.3–1.2)
BUN: 17 mg/dL (ref 6–20)
CO2: 24 mmol/L (ref 22–32)
Calcium: 8.3 mg/dL — ABNORMAL LOW (ref 8.9–10.3)
Chloride: 100 mmol/L — ABNORMAL LOW (ref 101–111)
Creatinine, Ser: 0.66 mg/dL (ref 0.44–1.00)
GFR calc Af Amer: >60 mL/min (ref >60)
GFR calc non Af Amer: >60 mL/min (ref >60)
GLUCOSE: 98 mg/dL (ref 65–99)
POTASSIUM: 4.3 mmol/L (ref 3.5–5.1)
Sodium: 132 mmol/L — ABNORMAL LOW (ref 135–145)
TOTAL PROTEIN: 5.2 g/dL — AB (ref 6.5–8.1)

## 2015-04-04 MED ORDER — BUSPIRONE HCL 5 MG PO TABS
10.0000 mg | ORAL_TABLET | Freq: Every day | ORAL | Status: DC
  Administered 2015-04-04: 10 mg via ORAL
  Filled 2015-04-04: qty 2

## 2015-04-04 MED ORDER — BUSPIRONE HCL 15 MG PO TABS
7.5000 mg | ORAL_TABLET | Freq: Two times a day (BID) | ORAL | Status: DC
  Administered 2015-04-04 – 2015-04-05 (×3): 7.5 mg via ORAL
  Filled 2015-04-04 (×3): qty 1

## 2015-04-04 MED ORDER — FUROSEMIDE 40 MG PO TABS
40.0000 mg | ORAL_TABLET | Freq: Two times a day (BID) | ORAL | Status: DC
  Administered 2015-04-04 – 2015-04-05 (×2): 40 mg via ORAL
  Filled 2015-04-04 (×2): qty 1

## 2015-04-04 MED ORDER — LEVOTHYROXINE SODIUM 100 MCG PO TABS
100.0000 ug | ORAL_TABLET | Freq: Every day | ORAL | Status: DC
  Administered 2015-04-04 – 2015-04-05 (×2): 100 ug via ORAL
  Filled 2015-04-04 (×2): qty 1

## 2015-04-04 MED ORDER — BUSPIRONE HCL 5 MG PO TABS
10.0000 mg | ORAL_TABLET | Freq: Every evening | ORAL | Status: DC | PRN
Start: 2015-04-04 — End: 2015-04-04

## 2015-04-04 MED ORDER — PANTOPRAZOLE SODIUM 40 MG PO TBEC
40.0000 mg | DELAYED_RELEASE_TABLET | Freq: Every day | ORAL | Status: DC
  Administered 2015-04-05: 40 mg via ORAL
  Filled 2015-04-04: qty 1

## 2015-04-04 MED ORDER — DOCUSATE SODIUM 100 MG PO CAPS
200.0000 mg | ORAL_CAPSULE | Freq: Two times a day (BID) | ORAL | Status: DC
  Administered 2015-04-04: 200 mg via ORAL
  Filled 2015-04-04: qty 2

## 2015-04-04 MED ORDER — HYDROCODONE-ACETAMINOPHEN 7.5-325 MG PO TABS
1.0000 | ORAL_TABLET | ORAL | Status: DC | PRN
  Administered 2015-04-04 – 2015-04-05 (×5): 1 via ORAL
  Filled 2015-04-04 (×5): qty 1

## 2015-04-04 MED ORDER — FUROSEMIDE 40 MG PO TABS: 40.0000 mg | ORAL_TABLET | Freq: Two times a day (BID) | ORAL | Status: DC

## 2015-04-04 MED ORDER — SODIUM CHLORIDE 0.9 % IV BOLUS (SEPSIS)
250.0000 mL | Freq: Once | INTRAVENOUS | Status: AC
  Administered 2015-04-04: 250 mL via INTRAVENOUS

## 2015-04-04 MED ORDER — BUSPIRONE HCL 15 MG PO TABS: 7.5000 mg | ORAL_TABLET | Freq: Two times a day (BID) | ORAL | Status: DC

## 2015-04-04 NOTE — Progress Notes (Signed)
04/04/2015 Pnueumococcal vaccine given in right deltoid at 1136. Lot #: [id number] and expire Aug 31, 2016. Rico Sheehan RN

## 2015-04-04 NOTE — Evaluation (Signed)
Physical Therapy Evaluation Patient Details Name: Lisa Mcclure MRN: [id number] DOB: 1932-06-21 Today's Date: 04/04/2015   History of Present Illness  Lisa Mcclure is an 81 y.o. female with a history of Gerd venous insufficiency cardiomegaly arthritis reflux etc. She's known to have diverticulosis. She has a fractured kneecap and a fractured shoulder that occurred on 2 different falls requiring some time in SNF but now she has moved over to assisted living in hopes to go back to her own home. She had the sudden onset yesterday bright red blood per rectum states that it was mixed with the stool. She has been taking narcotics for her fractures and this is required her to take MiraLAX to avoid being constipated. She has had several stools with clots. Hemoglobin has remained steady at 11 or over. Her BUN is only slightly up. She denies any prior hematochezia or melenic stools  Clinical Impression  Pt admitted with above diagnosis. Pt currently with functional limitations due to the deficits listed below (see PT Problem List). Pt is able to ambulate with hemiwalker with good stability overall. Has difficulty standing up from lower surfaces.  Currently, pt has a very low wheelchair and really needs a higher wheelchair and she could be independent in A living with more functions.  See wheelchair measurements below.  MD:  PLease give pt prescription with specs below so she can get a wheelchair as it is medically necessary for pt to be independent in facility with mobility.  Also pt needs a new left knee immobilizer as pts  current one is bent.  Nursing asked MD to order a new one.   Will follow acutely. Pt will benefit from skilled PT to increase their independence and safety with mobility to allow discharge to the venue listed below.      Follow Up Recommendations Home health PT;Supervision - Intermittent    Equipment Recommendations  Wheelchair (measurements 20x20 with 18 inch seat to floor  height, anti tippers, desk arms, elevating legrests);Pressure relieving Wheelchair cushion (measurements 20x20) (new knee immobilizer)    Recommendations for Other Services       Precautions / Restrictions Precautions Precautions: Fall;Knee;Shoulder Shoulder Interventions: Shoulder sling/immobilizer;At all times Required Braces or Orthoses: Knee Immobilizer - Left;Sling Restrictions Weight Bearing Restrictions: Yes LUE Weight Bearing: Non weight bearing LLE Weight Bearing: Weight bearing as tolerated      Mobility  Bed Mobility Overal bed mobility: Needs Assistance Bed Mobility: Supine to Sit     Supine to sit: Min guard     General bed mobility comments: used rail and was able to sit up with modif I  Transfers Overall transfer level: Needs assistance Equipment used: Hemi-walker Transfers: Sit to/from Stand Sit to Stand: Min assist;From elevated surface         General transfer comment: Needs assist only to perform sit to stand due to can't bring left LE back.  Can stand without help from higher surfaces.    Ambulation/Gait Ambulation/Gait assistance: Min guard Ambulation Distance (Feet): 200 Feet Assistive device: Hemi-walker Gait Pattern/deviations: Step-through pattern;Decreased stride length;Decreased step length - left;Decreased weight shift to left;Antalgic   Gait velocity interpretation: Below normal speed for age/gender General Gait Details: Pt with good sequencing of steps and hemiwalker. Pt without LOB with hemiwalker and able to ambulate without physical assist in controlled enviroment and even with slight challenges to balance in enclosed spaces.    Stairs            Emergency planning/management officer  Modified Rankin (Stroke Patients Only)       Balance Overall balance assessment: Needs assistance;History of Falls Sitting-balance support: No upper extremity supported;Feet supported Sitting balance-Leahy Scale: Good     Standing balance support:  Single extremity supported;During functional activity Standing balance-Leahy Scale: Poor Standing balance comment: relies on right UE for balance.             High level balance activites: Direction changes;Turns High Level Balance Comments: used hemiwalker with min guard assist and no LOB.              Pertinent Vitals/Pain Pain Assessment: 0-10 Pain Score: 5  Pain Location: knee pain left Pain Descriptors / Indicators: Aching Pain Intervention(s): Limited activity within patient's tolerance;Monitored during session;Premedicated before session;Repositioned  VSS    Home Living Family/patient expects to be discharged to:: Assisted living Living Arrangements: Alone Available Help at Discharge: Family;Available PRN/intermittently (can only check on pt ) Type of Home: Assisted living Home Access: Level entry     Home Layout: One level Home Equipment: Cane - single point;Wheelchair - manual;Hospital bed (hemiwalker and lift chair) Additional Comments: pt's daughter is CG for her husband who had a recent surgery.      Prior Function Level of Independence: Needs assistance   Gait / Transfers Assistance Needed: pt was using wheelchair to go to dining hall as well as other places at [facility].  Pt ambulated with hemiwalker with supervision in controlled environment.  Pt could get wheelchair into bathroom on her own to get on toilet.    ADL's / Homemaking Assistance Needed: Once pt at toilet, needed assist as she could not get pants down or up by herself.  Discussed with pt and daughter for pt to wear a dress or duster and no underwear so she could toilet independently.          Hand Dominance   Dominant Hand: Right    Extremity/Trunk Assessment   Upper Extremity Assessment: Defer to OT evaluation           Lower Extremity Assessment: LLE deficits/detail   LLE Deficits / Details: in immobilizer at all times as patella healing.     Communication    Communication: No difficulties  Cognition Arousal/Alertness: Awake/alert Behavior During Therapy: WFL for tasks assessed/performed Overall Cognitive Status: Within Functional Limits for tasks assessed                      General Comments      Exercises        Assessment/Plan    PT Assessment Patient needs continued PT services  PT Diagnosis Generalized weakness   PT Problem List Decreased activity tolerance;Decreased balance;Decreased mobility;Decreased knowledge of use of DME;Decreased safety awareness;Decreased knowledge of precautions  PT Treatment Interventions DME instruction;Gait training;Functional mobility training;Therapeutic activities;Therapeutic exercise;Balance training;Patient/family education   PT Goals (Current goals can be found in the Care Plan section) Acute Rehab PT Goals Patient Stated Goal: to go back to A living PT Goal Formulation: With patient/family Time For Goal Achievement: 04/11/15 Potential to Achieve Goals: Good    Frequency Min 3X/week   Barriers to discharge Decreased caregiver support      Co-evaluation               End of Session Equipment Utilized During Treatment: Gait belt Activity Tolerance: Patient limited by fatigue Patient left: in chair;with call bell/phone within reach;with family/visitor present Nurse Communication: Mobility status    Functional Assessment Tool Used: clinical judgement  Functional Limitation: Mobility: Walking and moving around Mobility: Walking and Moving Around Current Status [phone]): At least 1 percent but less than 20 percent impaired, limited or restricted Mobility: Walking and Moving Around Goal Status [phone]): 0 percent impaired, limited or restricted    Time: 0830-0930 PT Time Calculation (min) (ACUTE ONLY): 60 min   Charges:   PT Evaluation $Initial PT Evaluation Tier I: 1 Procedure PT Treatments $Gait Training: 8-22 mins $Self Care/Home Management: 8-22 $Wheel Chair  Management: 8-22 mins   PT G Codes:   PT G-Codes **NOT FOR INPATIENT CLASS** Functional Assessment Tool Used: clinical judgement Functional Limitation: Mobility: Walking and moving around Mobility: Walking and Moving Around Current Status [id number]): At least 1 percent but less than 20 percent impaired, limited or restricted Mobility: Walking and Moving Around Goal Status [phone]): 0 percent impaired, limited or restricted    Denice Paradise 04/04/2015, 10:35 AM [facility] Acute Rehabilitation [phone] (pager)

## 2015-04-04 NOTE — Progress Notes (Signed)
04/04/2015 Patient been having maroon color blood in stool. Dr Candiss Norse was made aware. Dr Katherine Basset (GI) was in room and observe stool in bedside commode. [facility] RN.

## 2015-04-04 NOTE — Progress Notes (Signed)
Patient Demographics:    Lisa Mcclure, is a 81 y.o. female, DOB - [birth year], [id number]  Admit date - 04/03/2015   Admitting Physician Elmarie Shiley, MD  Outpatient Primary MD for the patient is [name], MD  LOS - 1   Chief Complaint  Patient presents with  . Rectal Bleeding        Subjective:    Lisa Mcclure today has, No headache, No chest pain, No abdominal pain - No Nausea, No new weakness tingling or numbness, No Cough - SOB.     Assessment  & Plan :    Principal Problem:   Rectal bleeding Active Problems:   OSA (obstructive sleep apnea)   Left humeral fracture   Hypothyroid   Hypertension   Patellar fracture   Chronic diastolic heart failure, NYHA class 1 ([place])   1. Bright red blood per rectum. Suspicious went to hemorrhoids versus diverticular bleed. Equal GI on board, for now continue monitoring H&H no need for transfusion, stool softeners to avoid constipation,  PPI and monitor.  2. Left humerus and left patellar fracture. Left arm in sling, left leg in knee immobilizer, outpatient follow-up with orthopedics she is following with Dr. Ricki Rodriguez and Dr. Lillia Corporal.  3. Essential hypertension. On beta blocker continue.  4. Hypothyroidism. Continue home dose Synthroid.  5. Anxiety depression. Home medications continued unchanged.   6. Chronic diastolic heart failure with chronic edema. Continue home dose Lasix and monitor.   7. Obstructive sleep apnea. CPAP daily at bedtime.   8. Overactive bladder. Continue desmopressin.     Code Status : Full  Family Communication  : None  Disposition Plan  : SNF 1-2 days  Consults  :  GI  Procedures  :    DVT Prophylaxis  :    SCDs    Lab Results  Component Value Date   PLT 277 04/04/2015     Inpatient Medications  Scheduled Meds: . busPIRone  10 mg Oral QHS  . busPIRone  7.5 mg Oral BID  . desmopressin  0.4 mg Oral QHS  . metoprolol tartrate  25 mg Oral BID  . pantoprazole (PROTONIX) IV  40 mg Intravenous Q24H  . pneumococcal 23 valent vaccine  0.5 mL Intramuscular Tomorrow-1000  . polyethylene glycol  17 g Oral TID  . sodium chloride  3 mL Intravenous Q12H   Continuous Infusions:  PRN Meds:.acetaminophen **OR** [DISCONTINUED] acetaminophen, hydrALAZINE, HYDROcodone-acetaminophen, morphine injection  Antibiotics  :    Anti-infectives    None        Objective:   Filed Vitals:   04/03/15 2151 04/03/15 2342 04/04/15 0339 04/04/15 0345  BP: 152/78 138/61 146/89 146/89  Pulse: 79 64 78 67  Temp:  98.1 F (36.7 C) 97.7 F (36.5 C)   TempSrc:  Axillary Oral   Resp:  13 15 17   Height:      Weight:      SpO2:  97% 64% 99%    Wt Readings from Last 3 Encounters:  04/03/15 97.523 kg (215 lb)  03/10/15 97.886 kg (215 lb 12.8 oz)  02/19/15 96.616 kg (213 lb)     Intake/Output Summary (Last 24 hours) at 04/04/15 1055 Last data filed at 04/04/15 0702  Gross per 24 hour  Intake   1220 ml  Output      0 ml  Net   1220 ml     Physical Exam  Awake Alert, Oriented X 3, No new F.N deficits, Normal affect [place].AT,PERRAL Supple Neck,No JVD, No cervical lymphadenopathy appriciated.  Symmetrical Chest wall movement, Good air movement bilaterally, CTAB RRR,No Gallops,Rubs or new Murmurs, No Parasternal Heave +ve B.Sounds, Abd Soft, No tenderness, No organomegaly appriciated, No rebound - guarding or rigidity. No Cyanosis, Clubbing or edema, No new Rash or bruise, left knee in immobilizer, left shoulder in sling    Data Review:   Micro Results Recent Results (from the past 240 hour(s))  MRSA PCR Screening     Status: None   Collection Time: 04/03/15  2:37 PM  Result Value Ref Range Status   MRSA by PCR NEGATIVE NEGATIVE Final    Comment:        The  GeneXpert MRSA Assay (FDA approved for NASAL specimens only), is one component of a comprehensive MRSA colonization surveillance program. It is not intended to diagnose MRSA infection nor to guide or monitor treatment for MRSA infections.     Radiology Reports Dg Chest 2 View  03/06/2015  CLINICAL DATA:  Fall. Recent shoulder fracture. Anterior lower chest wall pain. EXAM: CHEST  2 VIEW COMPARISON:  08/09/2010 chest radiograph. FINDINGS: Stable cardiomediastinal silhouette with top-normal heart size. No pneumothorax. No pleural effusion. No pulmonary edema. Mild curvilinear opacities are present at both lung bases. No displaced fracture in the visualized chest. Severe osteoarthritis in the bilateral glenohumeral joints. IMPRESSION: Mild curvilinear opacities at both lung bases, favor scarring or atelectasis. Electronically Signed   By: Ilona Sorrel M.D.   On: 03/06/2015 12:09   Dg Wrist Complete Left  03/06/2015  CLINICAL DATA:  Tripped and fell today. Initial encounter. The patient fell to her knees. Burning and pain in the left wrist. Personal history of left wrist fracture 2 years ago. EXAM: LEFT WRIST - COMPLETE 3+ VIEW COMPARISON:  Left wrist radiographs 02/22/2013 FINDINGS: Moderate osteopenia is present. Previous fracture has healed. No definite acute fracture is evident. Degenerative changes extend to the DRUJ. IMPRESSION: 1. No acute abnormality. 2. Remote fractures have healed. 3. Moderate osteopenia. Electronically Signed   By: San Morelle M.D.   On: 03/06/2015 12:10   Ct Head Wo Contrast  03/06/2015  CLINICAL DATA:  Multiple falls last week. EXAM: CT HEAD WITHOUT CONTRAST TECHNIQUE: Contiguous axial images were obtained from the base of the skull through the vertex without intravenous contrast. COMPARISON:  None. FINDINGS: Ventricles, cisterns and other CSF spaces are within normal. There is no mass, mass effect, shift of midline structures or acute hemorrhage. No evidence  of acute infarction. Minimal degenerative change of the temporomandibular joints. Remaining bones and soft tissues are within normal. IMPRESSION: No acute intracranial findings. Electronically Signed   By: Marin Olp M.D.   On: 03/06/2015 16:53   Ct Angio Chest Pe W/cm &/or Wo Cm  03/09/2015  CLINICAL DATA:  Shortness of breath starting this morning. Recent shoulder fracture. EXAM: CT ANGIOGRAPHY CHEST WITH CONTRAST TECHNIQUE: Multidetector CT imaging of the chest was performed using the standard protocol during bolus administration of intravenous contrast. Multiplanar CT image reconstructions and MIPs were obtained to evaluate the vascular anatomy. CONTRAST:  58mL OMNIPAQUE IOHEXOL 350 MG/ML SOLN COMPARISON:  03/07/2015 shoulder CT. Chest radiograph 03/06/2015. Most recent chest CT of 01/17/2011. FINDINGS: Mediastinum/Nodes: The quality of this exam for evaluation of pulmonary embolism is poor to  moderate. The bolus is suboptimally timed, with contrast centered in the SVC. This is in addition to degradation secondary to mild motion and patient body habitus. No evidence of pulmonary embolism to the lobar level within the upper lobes. No large segmental embolism identified at the lung bases. Mild pulmonary artery enlargement, including a 2.9 cm left pulmonary artery. Moderate cardiomegaly with coronary artery atherosclerosis, including within likely left main and LAD. Lipomatous hypertrophy of the interatrial septum. No mediastinal or hilar adenopathy. Lungs/Pleura: Left greater than right trace pleural thickening. No pleural fluid. Mild motion degradation. Right base scarring. Upper abdomen: Normal imaged portions of the liver, spleen, stomach, pancreas, adrenal glands, kidneys. Musculoskeletal: Advanced left glenohumeral joint osteoarthritis. Proximal left humerus fracture, suboptimally evaluated. Review of the MIP images confirms the above findings. IMPRESSION: 1. Suboptimal exam, secondary to bolus timing,  patient size, motion artifact. No evidence of pulmonary embolism with limitations above. 2. Cardiomegaly with atherosclerosis in the coronary arteries. 3. Pulmonary artery enlargement suggests pulmonary arterial hypertension. Electronically Signed   By: Abigail Miyamoto M.D.   On: 03/09/2015 14:28   Mr Brain Wo Contrast  03/07/2015  CLINICAL DATA:  Gait abnormality.  Multiple recent falls. EXAM: MRI HEAD WITHOUT CONTRAST TECHNIQUE: Multiplanar, multiecho pulse sequences of the brain and surrounding structures were obtained without intravenous contrast. COMPARISON:  Head CT 03/06/2015 FINDINGS: Mild motion artifact. There is no evidence of acute infarct, intracranial hemorrhage, mass, midline shift, or extra-axial fluid collection. Mild cerebral atrophy is within normal limits for age. Small foci of T2 hyperintensity in the subcortical and deep cerebral white matter and pons are nonspecific but compatible with mild chronic small vessel ischemic disease. Orbits are unremarkable. Paranasal sinuses and mastoid air cells are clear. Major intracranial vascular flow voids are preserved. IMPRESSION: 1. No acute intracranial abnormality. 2. Mild chronic small vessel ischemic disease. Electronically Signed   By: Logan Bores M.D.   On: 03/07/2015 12:49   Ct Shoulder Left Wo Contrast  03/07/2015  CLINICAL DATA:  Recurrent falls with left shoulder pain. Question fracture. Initial encounter. EXAM: CT OF THE LEFT SHOULDER WITHOUT CONTRAST TECHNIQUE: Multidetector CT imaging was performed according to the standard protocol. Multiplanar CT image reconstructions were also generated. COMPARISON:  Plain films left shoulder 03/06/2015. FINDINGS: The patient has a fracture of the proximal humerus which shows mild lateral displacement of approximately 0.8 cm. The fracture extends from the posterior, inferior margin of the humeral head in an anterior and inferior orientation through the surgical neck. Small cortical break is also seen  in the posterior surgical neck. The fracture does not appear to involve the greater or lesser tuberosities. No other fracture is identified. The acromioclavicular joint is intact. There is severe glenohumeral osteoarthritis with marked joint space narrowing, osteophytosis and remodeling of the glenoid. Moderate acromioclavicular degenerative change is seen. Musculature about the shoulder girdle is somewhat atrophied. No rotator cuff tear is visualized on this exam. Imaged lung parenchyma is clear. IMPRESSION: Acute fracture of the proximal humerus shows mild lateral displacement but does not appear to involve the greater or lesser tuberosities. Severe glenohumeral osteoarthritis. Moderate acromioclavicular degenerative change. Electronically Signed   By: Inge Rise M.D.   On: 03/07/2015 11:31   Nm Pulmonary Perf And Vent  03/10/2015  CLINICAL DATA:  Shortness of breath and chest pain for 1 day EXAM: NUCLEAR MEDICINE VENTILATION - PERFUSION LUNG SCAN Views: Anterior, posterior, LPO, RPO, LAO, RAO -ventilation and perfusion Radionuclide: Technetium 51m DTPA -ventilation ; Technetium 7m macroaggregated albumin-perfusion Dose:  [id number] millicurie-ventilation; 4.3 millicurie-perfusion Route of administration: Inhalation -ventilation ; intravenous -perfusion COMPARISON:  Chest CT March 09, 2015 FINDINGS: Ventilation: Radiotracer uptake is homogeneous and symmetric bilaterally. No ventilation defects are appreciable. Perfusion: Radiotracer uptake is homogeneous is symmetric bilaterally. No perfusion defects are appreciable. IMPRESSION: No appreciable ventilation or perfusion defects. This study constitutes a very low probability of pulmonary embolus. Electronically Signed   By: Lowella Grip III M.D.   On: 03/10/2015 10:11   Dg Shoulder Left  03/06/2015  CLINICAL DATA:  Initial encounter for Pt tripped and fell this morning, she went down and hit both of her knees, then fell and landed on her left side.  Known Left shoulder Fx from 2 weeks ago, and she has a previous left wrist Fx from 2014. She has bilateral knee p.*comment was truncated* EXAM: LEFT SHOULDER - 2+ VIEW COMPARISON:  None. FINDINGS: Osteopenia. Visualized portion of the left hemithorax is normal. Advanced glenohumeral joint osteoarthritis. Undersurface acromioclavicular joint degenerative change. High-riding humeral head consistent with chronic rotator cuff insufficiency. No acute fracture or dislocation. IMPRESSION: Advanced osteoarthritis.  No acute findings. Electronically Signed   By: Abigail Miyamoto M.D.   On: 03/06/2015 12:09   Dg Knee Complete 4 Views Left  03/06/2015  CLINICAL DATA:  Knee pain secondary to a fall this morning. EXAM: LEFT KNEE - COMPLETE 4+ VIEW COMPARISON:  None. FINDINGS: There is a nondisplaced fracture of the mid patella. There is a small joint effusion. The patient has had a hemiarthroplasty in the medial compartment. There are slight degenerative changes in the patellofemoral compartment and in the lateral compartment with chondromalacia in the lateral compartment. There is slight irregularity of the lateral tibial plateau beneath the prosthesis. IMPRESSION: Acute nondisplaced fracture of the mid patella. Electronically Signed   By: Lorriane Shire M.D.   On: 03/06/2015 12:09   Dg Knee Complete 4 Views Right  03/06/2015  CLINICAL DATA:  Initial encounter for Pt tripped and fell this morning, she went down and hit both of her knees, then fell and landed on her left side. Known Left shoulder Fx from 2 weeks ago, and she has a previous left wrist Fx from 2014. She has bilateral knee p.*comment was truncated* EXAM: RIGHT KNEE - COMPLETE 4+ VIEW COMPARISON:  None. FINDINGS: Knee arthroplasty. No acute hardware complication. No joint effusion. No acute fracture or dislocation. IMPRESSION: Expected appearance after knee arthroplasty. Electronically Signed   By: Abigail Miyamoto M.D.   On: 03/06/2015 12:07   Dg Hips Bilat With  Pelvis 3-4 Views  03/06/2015  CLINICAL DATA:  Fall.  Initial evaluation. EXAM: DG HIP (WITH OR WITHOUT PELVIS) 3-4V BILAT COMPARISON:  No prior exam for comparison. FINDINGS: Diffuse osteopenia degenerative change. No acute abnormality. No evidence of fracture dislocation. IMPRESSION: Diffuse osteopenia degenerative change.  No acute abnormality. Electronically Signed   By: Marcello Moores  Register   On: 03/06/2015 12:07     CBC  Recent Labs Lab 04/03/15 1000 04/03/15 1057 04/03/15 1600 04/04/15 0405  WBC 8.9  --  10.0 7.2  HGB 11.2* 12.6 10.7* 9.0*  HCT 34.5* 37.0 32.0* 27.5*  PLT 331  --  312 277  MCV 92.7  --  92.5 92.9  MCH 30.1  --  30.9 30.4  MCHC 32.5  --  33.4 32.7  RDW 13.0  --  13.0 13.1  LYMPHSABS 1.3  --   --   --   MONOABS 0.7  --   --   --   EOSABS  0.3  --   --   --   BASOSABS 0.0  --   --   --     Chemistries   Recent Labs Lab 04/03/15 1000 04/03/15 1057 04/04/15 0405  NA 133* 132* 132*  K 4.2 4.0 4.3  CL 95* 92* 100*  CO2 30  --  24  GLUCOSE 141* 129* 98  BUN 20 22* 17  CREATININE 0.75 0.80 0.66  CALCIUM 8.7*  --  8.3*  AST 18  --  17  ALT 11*  --  10*  ALKPHOS 119  --  87  BILITOT 0.4  --  0.3   ------------------------------------------------------------------------------------------------------------------ estimated creatinine clearance is 62.3 mL/min (by C-G formula based on Cr of 0.66). ------------------------------------------------------------------------------------------------------------------ No results for input(s): HGBA1C in the last 72 hours. ------------------------------------------------------------------------------------------------------------------ No results for input(s): CHOL, HDL, LDLCALC, TRIG, CHOLHDL, LDLDIRECT in the last 72 hours. ------------------------------------------------------------------------------------------------------------------ No results for input(s): TSH, T4TOTAL, T3FREE, THYROIDAB in the last 72  hours.  Invalid input(s): FREET3 ------------------------------------------------------------------------------------------------------------------ No results for input(s): VITAMINB12, FOLATE, FERRITIN, TIBC, IRON, RETICCTPCT in the last 72 hours.  Coagulation profile No results for input(s): INR, PROTIME in the last 168 hours.  No results for input(s): DDIMER in the last 72 hours.  Cardiac Enzymes No results for input(s): CKMB, TROPONINI, MYOGLOBIN in the last 168 hours.  Invalid input(s): CK ------------------------------------------------------------------------------------------------------------------ Invalid input(s): POCBNP   Time Spent in minutes  35   SINGH,PRASHANT K M.D on 04/04/2015 at 10:55 AM  Between 7am to 7pm - Pager - [phone]  After 7pm go to [email or web address] - password [facility]  [facility] -  Office  [phone]

## 2015-04-04 NOTE — Progress Notes (Signed)
Patient is refusing CPAP use at this time. RT educated patient on importance of use and RT will continue to monitor as needed.

## 2015-04-04 NOTE — Progress Notes (Signed)
[facility] PROGRESS NOTE Subjective patient has continued to have some bright red blood per rectum and has dropped her hemoglobin a couple grams. She feels that it may be slowing down. No abdominal pain.  Objective: Vital signs in last 24 hours: Temp:  [97.7 F (36.5 C)-98.1 F (36.7 C)] 97.7 F (36.5 C) (12/24 0339) Pulse Rate:  [63-79] 67 (12/24 0345) Resp:  [13-17] 17 (12/24 0345) BP: (136-201)/(61-89) 146/89 mmHg (12/24 0345) SpO2:  [64 %-99 %] 99 % (12/24 0345) Last BM Date: 04/04/15  Intake/Output from previous day: 12/23 0701 - 12/24 0700 In: 1068.3 [P.O.:200; I.V.:868.3] Out: -  Intake/Output this shift: Total I/O In: 151.7 [P.O.:100; I.V.:51.7] Out: -   PE: General-- no acute distress, small amount of bright red blood in the bedside toilet  Abdomen-- soft and nontender  Lab Results:  Recent Labs  04/03/15 1000 04/03/15 1057 04/03/15 1600 04/04/15 0405  WBC 8.9  --  10.0 7.2  HGB 11.2* 12.6 10.7* 9.0*  HCT 34.5* 37.0 32.0* 27.5*  PLT 331  --  312 277   BMET  Recent Labs  04/03/15 1000 04/03/15 1057 04/04/15 0405  NA 133* 132* 132*  K 4.2 4.0 4.3  CL 95* 92* 100*  CO2 30  --  24  CREATININE 0.75 0.80 0.66   LFT  Recent Labs  04/03/15 1000 04/04/15 0405  PROT 6.4* 5.2*  AST 18 17  ALT 11* 10*  ALKPHOS 119 87  BILITOT 0.4 0.3   PT/INR No results for input(s): LABPROT, INR in the last 72 hours. PANCREAS No results for input(s): LIPASE in the last 72 hours.       Studies/Results: No results found.  Medications: I have reviewed the patient's current medications.  Assessment/Plan: 1. Lower G.I. bleed. Probably diverticular her hemoglobin is drop but the bleeding does appear to have slowed. Long discussion about this with the patient and her daughter. She may well need a another colonoscopy at some point in the future after the bleeding stopped. Would continue her own Miralax for now.   Azelia Reiger JR,Raj Landress L 04/04/2015,  11:04 AM  Pager: [phone] If no answer or after hours call [phone]

## 2015-04-04 NOTE — Evaluation (Signed)
Occupational Therapy Evaluation Patient Details Name: Lisa Mcclure MRN: [id number] DOB: 26-Mar-1933 Today's Date: 04/04/2015    History of Present Illness Lisa Mcclure is an 81 y.o. female with a history of Gerd venous insufficiency cardiomegaly arthritis reflux etc. She's known to have diverticulosis. She has a fractured kneecap and a fractured shoulder that occurred on 2 different falls (Mar 01, 2015) requiring some time in SNF but now she has moved over to assisted living in hopes to go back to her own home. She had the sudden onset yesterday bright red blood per rectum states that it was mixed with the stool.    Clinical Impression   This 81 yo female admitted with above presents to acute OT with decreased balance, decreased mobility, decrease use of LUE and LLE (sling and KI respectively) all affecting her PLOF of independent prior to her fall Feb 28, 2014 (per her and son's report). She will benefit from acute OT with follow HHOT at ALF.     Follow Up Recommendations  Home health OT;Other (comment) (at ALF)          Precautions / Restrictions Precautions Precautions: Fall;Knee;Shoulder Shoulder Interventions: Shoulder sling/immobilizer;At all times Required Braces or Orthoses: Knee Immobilizer - Left;Sling Restrictions Weight Bearing Restrictions: Yes LUE Weight Bearing: Non weight bearing LLE Weight Bearing: Weight bearing as tolerated (in KI )      Mobility Bed Mobility Overal bed mobility: Needs Assistance Bed Mobility: Supine to Sit;Sit to Supine     Supine to sit: Min assist Sit to supine: Min assist      Transfers Overall transfer level: Needs assistance Equipment used: Hemi-walker Transfers: Sit to/from Stand Sit to Stand: Min guard;From elevated surface                   ADL Overall ADL's : Needs assistance/impaired Eating/Feeding: Set up;Sitting   Grooming: Minimal assistance;Sitting   Upper Body Bathing: Moderate  assistance;Sitting   Lower Body Bathing: Maximal assistance (min guard A sit <>stand)   Upper Body Dressing : Total assistance;Sitting   Lower Body Dressing: Total assistance (min guard A sit <>stand)   Toilet Transfer: Min guard;Stand-pivot;BSC   Toileting- Clothing Manipulation and Hygiene: Maximal assistance (min guard A sit <>stand)         General ADL Comments: Upon standing pt became incontient of bloody, runny stool--RN made aware               Pertinent Vitals/Pain Pain Assessment: No/denies pain     Hand Dominance Right   Extremity/Trunk Assessment Upper Extremity Assessment Upper Extremity Assessment: LUE deficits/detail LUE Deficits / Details: shoulder fx Nov20,2016--will be in sling for another 2 weeks per pt. Cannot move shoulder but OK to use hand and do elbow flexion/extension LUE Coordination: decreased gross motor           Communication Communication Communication: No difficulties   Cognition Arousal/Alertness: Awake/alert Behavior During Therapy: WFL for tasks assessed/performed Overall Cognitive Status: Within Functional Limits for tasks assessed                        Exercises   Other Exercises Other Exercises: had pt do 10 reps of elbow flexion/extension supine        Home Living Family/patient expects to be discharged to:: Assisted living   Available Help at Discharge: Available 24 hours/day (ALF staff) Type of Home: Assisted living Home Access: Level entry     Home Layout: One level  Bathroom Shower/Tub: Occupational psychologist: Handicapped height     Home Equipment: [place] - single point;Wheelchair - manual;Hospital bed (hemi walker and lift chair)   Additional Comments: pt's daughter is CG for her husband who had a recent surgery.        Prior Functioning/Environment Level of Independence: Needs assistance  Gait / Transfers Assistance Needed: pt was using wheelchair to go to dining hall as well as  other places at [facility].  Pt ambulated with hemiwalker with supervision in controlled environment.  Pt could get wheelchair into bathroom on her own to get on toilet.   ADL's / Homemaking Assistance Needed: Once pt at toilet, needed assist as she could not get pants down or up by herself.  Discussed with pt and daughter for pt to wear a dress or duster and no underwear so she could toilet independently.          OT Diagnosis: Generalized weakness   OT Problem List: Decreased strength;Obesity;Impaired UE functional use;Impaired balance (sitting and/or standing)   OT Treatment/Interventions: Self-care/ADL training;Patient/family education;Balance training;DME and/or AE instruction;Therapeutic exercise    OT Goals(Current goals can be found in the care plan section) Acute Rehab OT Goals Patient Stated Goal: to go back to A living OT Goal Formulation: With patient Time For Goal Achievement: 04/11/15 Potential to Achieve Goals: Good  OT Frequency: Min 2X/week   Barriers to D/C: Decreased caregiver support             End of Session Equipment Utilized During Treatment: Rolling walker (sling LUE, KI LLE) Nurse Communication:  (Pt having bloody, runny stools)  Activity Tolerance: Patient tolerated treatment well Patient left: in bed;with call bell/phone within reach;with family/visitor present   Time: 1420-1451 OT Time Calculation (min): 31 min Charges:  OT General Charges $OT Visit: 1 Procedure OT Evaluation $Initial OT Evaluation Tier I: 1 Procedure OT Treatments $Self Care/Home Management : 8-22 mins  Almon Register [id number] 04/04/2015, 3:02 PM

## 2015-04-04 NOTE — Progress Notes (Signed)
Orthopedic Tech Progress Note Patient Details:  Lisa Mcclure [birth year] [id number]  Ortho Devices Type of Ortho Device: Knee Immobilizer Ortho Device/Splint Location: lle Ortho Device/Splint Interventions: Application   Lisa Mcclure 04/04/2015, 1:04 PM

## 2015-04-05 ENCOUNTER — Telehealth: Payer: Self-pay | Admitting: Surgery

## 2015-04-05 LAB — HEMOGLOBIN AND HEMATOCRIT, BLOOD
HEMATOCRIT: 28.8 % — AB (ref 36.0–46.0)
HEMOGLOBIN: 9.2 g/dL — AB (ref 12.0–15.0)

## 2015-04-05 MED ORDER — ASPIRIN 81 MG PO CHEW: 81.0000 mg | CHEWABLE_TABLET | Freq: Every day | ORAL | Status: DC

## 2015-04-05 MED ORDER — HYDROCODONE-ACETAMINOPHEN 7.5-325 MG PO TABS: 1.0000 | ORAL_TABLET | ORAL | Status: DC | PRN

## 2015-04-05 NOTE — Care Management Note (Addendum)
Case Management Note  Patient Details  Name: Michaela Boulos MRN: [id number] Date of Birth: [birth year]  Subjective/Objective:   81 y.o. F to return to [facility] with HHPT/OT. Will fax request to facility as Therapies are provided on site through [facility]. Made [facility] rep Merry Proud) aware of DME needs for today prior to discharge. (W/C , 18 " Seat to Floor Ht, with 20x20 cushion, ELR, Desk Arms and Anti Tippers.                  Action/Plan: Anticipate discharge to facility today. No further CM needs but will be available should additional discharge needs arise.   Expected Discharge Date:  04/05/15               Expected Discharge Plan:  Assisted Living / Rest Home ([facility])  In-House Referral:  Clinical Social Work  Discharge planning Services  CM Consult  Post Acute Care Choice:    Choice offered to:  Patient  DME Arranged:  Wheelchair manual (W/C 20 x 20 with 18" Seat to floor Height, Anti Tippers, desk arms, Elevated Leg rests and pressure relieving  w/c cushion 20x20.) DME Agency:  [place] Arranged:  PT, OT [place] Agency:   ([facility] [employer])  Status of Service:  In process, will continue to follow  Medicare Important Message Given:    Date Medicare IM Given:    Medicare IM give by:    Date Additional Medicare IM Given:    Additional Medicare Important Message give by:     If discussed at [place] of Stay Meetings, dates discussed:    Additional Comments:  Delrae Sawyers, RN 04/05/2015, 12:02 PM

## 2015-04-05 NOTE — Progress Notes (Signed)
[facility] PROGRESS NOTE Subjective patient passing small amount of whole blood. Hemoglobin is been stable  Objective: Vital signs in last 24 hours: Temp:  [97.5 F (36.4 C)-98.1 F (36.7 C)] 98.1 F (36.7 C) (12/25 0352) Pulse Rate:  [66-84] 81 (12/25 0352) Resp:  [11-20] 20 (12/25 0352) BP: (144-179)/(54-73) 162/61 mmHg (12/25 0352) SpO2:  [67 %-100 %] 67 % (12/25 0352) Last BM Date: 04/05/15  Intake/Output from previous day: 12/24 0701 - 12/25 0700 In: 254.7 [P.O.:200; I.V.:54.7] Out: -  Intake/Output this shift:    PE: General-- no acute distress  Abdomen-- soft and nontender  Lab Results:  Recent Labs  04/03/15 1000 04/03/15 1057 04/03/15 1600 04/04/15 0405 04/05/15 0405  WBC 8.9  --  10.0 7.2  --   HGB 11.2* 12.6 10.7* 9.0* 9.2*  HCT 34.5* 37.0 32.0* 27.5* 28.8*  PLT 331  --  312 277  --    BMET  Recent Labs  04/03/15 1000 04/03/15 1057 04/04/15 0405  NA 133* 132* 132*  K 4.2 4.0 4.3  CL 95* 92* 100*  CO2 30  --  24  CREATININE 0.75 0.80 0.66   LFT  Recent Labs  04/03/15 1000 04/04/15 0405  PROT 6.4* 5.2*  AST 18 17  ALT 11* 10*  ALKPHOS 119 87  BILITOT 0.4 0.3   PT/INR No results for input(s): LABPROT, INR in the last 72 hours. PANCREAS No results for input(s): LIPASE in the last 72 hours.       Studies/Results: No results found.  Medications: I have reviewed the patient's current medications.  Assessment/Plan: 1. Acute G.I. bleeding. Most consistent with diverticular bleed. Still clearing out some old stool blood stools are quite soft. Her hemoglobin is stable. She definitely needs to remain on Miralax order to keep her stools soft and have gone over with her how to do this. We can see her back in the office in a couple weeks and check her hemoglobin and if she is still having issues with bleeding arrange an outpatient colonoscopy.   Linnea Todisco JR,Dameian Crisman L 04/05/2015, 9:19 AM  Pager: [phone] If no answer or  after hours call [phone]

## 2015-04-05 NOTE — NC FL2 (Signed)
[place] LEVEL OF CARE SCREENING TOOL     IDENTIFICATION  Patient Name: Lisa Mcclure Birthdate: 12/04/1932 Sex: female Admission Date (Current Location): 04/03/2015  [facility] and [place] Number:  Herbalist and Address:  [facility] [place] [address] [place], [place] [address]      Provider Number: [id number]  Attending Physician Name and Address:  Thurnell Lose, MD  Relative Name and Phone Number:       Current Level of Care: Hospital Recommended Level of Care: [place] Prior Approval Number:    Date Approved/Denied:   PASRR Number:    Discharge Plan: Domiciliary (Rest home)    Current Diagnoses: Patient Active Problem List   Diagnosis Date Noted  . Rectal bleeding 04/03/2015  . Patellar fracture 03/06/2015  . Chronic diastolic heart failure, NYHA class 1 ([place]) 03/06/2015  . Left humeral fracture 11/04/2013  . Rib fracture 11/04/2013  . Hypothyroid 11/04/2013  . Hypertension 11/04/2013  . OSA (obstructive sleep apnea) 11/13/2008  . BOOP (bronchiolitis obliterans with organizing pneumonia) ([place]) 08/25/2008    Orientation RESPIRATION BLADDER Height & Weight    Self, Time, Situation, Place    Continent 5\' 3"  (160 cm) 212 lbs.  BEHAVIORAL SYMPTOMS/MOOD NEUROLOGICAL BOWEL NUTRITION STATUS      Continent Diet (Heart Healthy)  AMBULATORY STATUS COMMUNICATION OF NEEDS Skin   Limited Assist Verbally Normal                       Personal Care Assistance Level of Assistance  Bathing, Dressing, Feeding Bathing Assistance: Limited assistance Feeding assistance: Limited assistance (Assistance with set up) Dressing Assistance: Limited assistance     Functional Limitations [place]  PT (By licensed PT), OT (By licensed OT)     PT Frequency: HH - 3x weekly OT Frequency: HH - 3x weekly            Contractures      Additional Factors  Info  Code Status Code Status Info: Full             Current Medications (04/05/2015):  This is the current hospital active medication list Current Facility-Administered Medications  Medication Dose Route Frequency Provider Last Rate Last Dose  . acetaminophen (TYLENOL) tablet 650 mg  650 mg Oral Q6H PRN Samella Parr, NP   650 mg at 04/04/15 0650  . busPIRone (BUSPAR) tablet 10 mg  10 mg Oral QHS Thurnell Lose, MD   10 mg at 04/04/15 2210  . busPIRone (BUSPAR) tablet 7.5 mg  7.5 mg Oral BID Thurnell Lose, MD   7.5 mg at 04/05/15 0827  . desmopressin (DDAVP) tablet 0.4 mg  0.4 mg Oral QHS Samella Parr, NP   0.4 mg at 04/04/15 2210  . furosemide (LASIX) tablet 40 mg  40 mg Oral BID Assunta Found Stone, RPH   40 mg at 04/05/15 [id number]  . hydrALAZINE (APRESOLINE) injection 10 mg  10 mg Intravenous Q8H PRN Belkys A Regalado, MD      . HYDROcodone-acetaminophen (NORCO) 7.5-325 MG per tablet 1 tablet  1 tablet Oral Q4H PRN Thurnell Lose, MD   1 tablet at 04/05/15 0704  . levothyroxine (SYNTHROID, LEVOTHROID) tablet 100 mcg  100 mcg Oral QAC breakfast Thurnell Lose, MD   100 mcg at 04/05/15 0704  . metoprolol tartrate (LOPRESSOR) tablet  25 mg  25 mg Oral BID Samella Parr, NP   25 mg at 04/05/15 [id number]  . morphine 2 MG/ML injection 1 mg  1 mg Intravenous Q2H PRN Thurnell Lose, MD   1 mg at 04/05/15 0827  . pantoprazole (PROTONIX) EC tablet 40 mg  40 mg Oral Daily Thurnell Lose, MD   40 mg at 04/05/15 0954  . sodium chloride 0.9 % injection 3 mL  3 mL Intravenous Q12H Samella Parr, NP   3 mL at 04/05/15 1000     Discharge Medications: Medication List    STOP taking these medications       meloxicam 15 MG tablet  Commonly known as: MOBIC      TAKE these medications       acetaminophen 325 MG tablet  Commonly known as: TYLENOL  Take 650 mg by mouth every 4 (four) hours as needed for mild pain.     aspirin 81 MG chewable tablet  Chew 1 tablet  (81 mg total) by mouth daily.  Start taking on: 04/08/2015     busPIRone 7.5 MG tablet  Commonly known as: BUSPAR  Take 7.5 mg by mouth 2 (two) times daily. In the morning and afternoon for anxiety     busPIRone 10 MG tablet  Commonly known as: BUSPAR  Take 10 mg by mouth at bedtime. In the evening     cholecalciferol 1000 UNITS tablet  Commonly known as: VITAMIN D  Take 1,000 Units by mouth daily.     desmopressin 0.2 MG tablet  Commonly known as: DDAVP  Take 0.4 mg by mouth at bedtime.     furosemide 80 MG tablet  Commonly known as: LASIX  Take 80 mg by mouth daily. In the morning     hydrALAZINE 10 MG tablet  Commonly known as: APRESOLINE  Take 10 mg by mouth 2 (two) times daily. HOLD for SBP <110     HYDROcodone-acetaminophen 7.5-325 MG tablet  Commonly known as: NORCO  Take 1 tablet by mouth every 4 (four) hours as needed for moderate pain.     levothyroxine 100 MCG tablet  Commonly known as: SYNTHROID, LEVOTHROID  Take 100 mcg by mouth daily before breakfast.     losartan 100 MG tablet  Commonly known as: COZAAR  Take 100 mg by mouth daily.     Melatonin 5 MG Tabs  Take 5 mg by mouth at bedtime.     metoprolol 50 MG tablet  Commonly known as: LOPRESSOR  Take 50 mg by mouth 2 (two) times daily.     MULTI-DAY VITAMINS PO  Take 1 tablet by mouth daily.     mupirocin ointment 2 %  Commonly known as: BACTROBAN  Place 1 application into the nose daily.     nortriptyline 25 MG capsule  Commonly known as: PAMELOR  Take 25 mg by mouth at bedtime.     omeprazole 20 MG capsule  Commonly known as: PRILOSEC  Take 20 mg by mouth 2 (two) times daily before a meal.     polyethylene glycol packet  Commonly known as: MIRALAX / GLYCOLAX  Take 17 g by mouth daily.     potassium chloride SA 20 MEQ tablet  Commonly known as: K-DUR,KLOR-CON  Take 1 tablet (20 mEq  total) by mouth daily.     sennosides-docusate sodium 8.6-50 MG tablet  Commonly known as: SENOKOT-S  Take 1 tablet by mouth daily as needed for constipation.  Relevant Imaging Results:  Relevant Lab Results:   Additional Information    Tonishia Steffy, Daneil Dolin, LCSW

## 2015-04-05 NOTE — Discharge Summary (Signed)
Lisa Mcclure, is a 81 y.o. female  DOB [birth year]  MRN [id number].  Admission date:  04/03/2015  Admitting Physician  Elmarie Shiley, MD  Discharge Date:  04/05/2015   Primary MD  [name], MD  Recommendations for primary care physician for things to follow:   Monitor H&H closely check in the next 2-3 days. If bleeding is getting worse come back to the ER.   Admission Diagnosis  Gastrointestinal hemorrhage, unspecified gastritis, unspecified gastrointestinal hemorrhage type [K92.2]   Discharge Diagnosis  Gastrointestinal hemorrhage, unspecified gastritis, unspecified gastrointestinal hemorrhage type [K92.2]     Principal Problem:   Rectal bleeding Active Problems:   OSA (obstructive sleep apnea)   Left humeral fracture   Hypothyroid   Hypertension   Patellar fracture   Chronic diastolic heart failure, NYHA class 1 (HCC)      Past Medical History  Diagnosis Date  . Rheumatoid arthritis(714.0)   . Unspecified essential hypertension   . Esophageal reflux   . Hypothyroidism   . Diverticulosis   . Insomnia   . OSA (obstructive sleep apnea)   . OAB (overactive bladder)   . LBP (low back pain)   . DOE (dyspnea on exertion)   . Shoulder pain, bilateral   . GERD (gastroesophageal reflux disease)   . OAB (overactive bladder)   . Depression   . Venous insufficiency   . Cardiomegaly   . Osteoarthritis     Past Surgical History  Procedure Laterality Date  . Appendectomy  1953  . Vesicovaginal fistula closure w/ tah    . Breast biopsy Right 1980  . Knee arthroscopy w/ autogenous cartilage implantation (aci) procedure Left 1994, 1995  . Knee arthroscopy Right 1996, 2010  . Shoulder surgery  1990  . Cystocele repair    . Cesarean section      1957, 1961, 1964  . Wrist surgery   1967  . Total abdominal hysterectomy  1972  . Bladder abduction-1996  1996  . Cosmetic surgery  1996       HPI  from the history and physical done on the day of admission:   81 year old female patient known to me from previous admission for frequent falls with resultant left humeral fracture and left patellar fracture. Patient was discharged to skilled nursing facility in improved to the point she was able to transfer out to assisted living. She also has history of hypertension, chronic diastolic heart failure, hypothyroidism and overactive bladder. Patient reports abrupt onset of rectal bleeding yesterday evening. She describes the stool as being soft and formed mixed with water and blood. She's had 5 stool since onset with some clots. Had no abdominal pain and only pain is related to her left humeral fracture.       Hospital Course:     1. Bright red blood per rectum. Suspicious went to hemorrhoids versus diverticular bleed. Dr. Oletta Lamas from GI on board, her H&H remained stable, bleeding is receding, no transfusion needed, in fact today's H&H is higher than yesterday, discussed with  Dr. Oletta Lamas cleared for discharge with close outpatient H&H monitoring and follow-up with him in the next couple of weeks for an outpatient colonoscopy. Avoid constipation, continue as needed Miralax.   2. Left humerus and left patellar fracture. Left arm in sling, left leg in knee immobilizer, outpatient follow-up with orthopedics she is following with Dr. Ricki Rodriguez and Dr. Lillia Corporal.  3. Essential hypertension. On beta blocker continue.  4. Hypothyroidism. Continue home dose Synthroid.  5. Anxiety depression. Home medications continued unchanged.   6. Chronic diastolic heart failure with chronic edema. Continue home dose Lasix and monitor.   7. Obstructive sleep apnea. CPAP daily at bedtime.   8. Overactive bladder. Continue desmopressin.       Discharge Condition: Fair  Follow UP  Follow-up  Information    Follow up with [name], MD. Schedule an appointment as soon as possible for a visit in 3 days.   Specialty:  Internal Medicine   Contact information:   [place]  [id number] [phone]       Follow up with Winfield Cunas, MD. Schedule an appointment as soon as possible for a visit in 1 week.   Specialty:  Gastroenterology   Contact information:   [id number] [address] [phone]        [place] and Activity recommendation: See Discharge Instructions below  Discharge Instructions       Discharge Instructions    Discharge instructions    Complete by:  As directed   Follow with Primary MD [name], MD in 2-3 days   Get CBC, CMP, 2 view Chest X ray checked  by Primary MD next visit.    Activity: As tolerated with Full fall precautions use walker/cane & assistance as needed   Disposition Home     Diet:  Soft Heart Healthy   For Heart failure patients - Check your Weight same time everyday, if you gain over 2 pounds, or you develop in leg swelling, experience more shortness of breath or chest pain, call your Primary MD immediately. Follow Cardiac Low Salt Diet and 1.5 lit/day fluid restriction.   On your next visit with your primary care physician please Get Medicines reviewed and adjusted.   Please request your Prim.MD to go over all Hospital Tests and Procedure/Radiological results at the follow up, please get all Hospital records sent to your Prim MD by signing hospital release before you go home.   If you experience worsening of your admission symptoms, develop shortness of breath, life threatening emergency, suicidal or homicidal thoughts you must seek medical attention immediately by calling 911 or calling your MD immediately  if symptoms less severe.  You Must read complete instructions/literature along with all the possible adverse reactions/side  effects for all the Medicines you take and that have been prescribed to you. Take any new Medicines after you have completely understood and accpet all the possible adverse reactions/side effects.   Do not drive, operating heavy machinery, perform activities at heights, swimming or participation in water activities or provide baby sitting services if your were admitted for syncope or siezures until you have seen by Primary MD or a Neurologist and advised to do so again.  Do not drive when taking Pain medications.    Do not take more than prescribed Pain, Sleep and Anxiety Medications  Special Instructions: If you have smoked or chewed Tobacco  in the last 2 yrs please stop smoking, stop any  regular Alcohol  and or any Recreational drug use.  Wear Seat belts while driving.   Please note  You were cared for by a hospitalist during your hospital stay. If you have any questions about your discharge medications or the care you received while you were in the hospital after you are discharged, you can call the unit and asked to speak with the hospitalist on call if the hospitalist that took care of you is not available. Once you are discharged, your primary care physician will handle any further medical issues. Please note that NO REFILLS for any discharge medications will be authorized once you are discharged, as it is imperative that you return to your primary care physician (or establish a relationship with a primary care physician if you do not have one) for your aftercare needs so that they can reassess your need for medications and monitor your lab values.     Increase activity slowly    Complete by:  As directed              Discharge Medications       Medication List    STOP taking these medications        meloxicam 15 MG tablet  Commonly known as:  MOBIC      TAKE these medications        acetaminophen 325 MG tablet  Commonly known as:  TYLENOL  Take 650 mg by mouth every 4  (four) hours as needed for mild pain.     aspirin 81 MG chewable tablet  Chew 1 tablet (81 mg total) by mouth daily.  Start taking on:  04/08/2015     busPIRone 7.5 MG tablet  Commonly known as:  BUSPAR  Take 7.5 mg by mouth 2 (two) times daily. In the morning and afternoon for anxiety     busPIRone 10 MG tablet  Commonly known as:  BUSPAR  Take 10 mg by mouth at bedtime. In the evening     cholecalciferol 1000 UNITS tablet  Commonly known as:  VITAMIN D  Take 1,000 Units by mouth daily.     desmopressin 0.2 MG tablet  Commonly known as:  DDAVP  Take 0.4 mg by mouth at bedtime.     furosemide 80 MG tablet  Commonly known as:  LASIX  Take 80 mg by mouth daily. In the morning     hydrALAZINE 10 MG tablet  Commonly known as:  APRESOLINE  Take 10 mg by mouth 2 (two) times daily. HOLD for SBP <110     HYDROcodone-acetaminophen 7.5-325 MG tablet  Commonly known as:  NORCO  Take 1 tablet by mouth every 4 (four) hours as needed for moderate pain.     levothyroxine 100 MCG tablet  Commonly known as:  SYNTHROID, LEVOTHROID  Take 100 mcg by mouth daily before breakfast.     losartan 100 MG tablet  Commonly known as:  COZAAR  Take 100 mg by mouth daily.     Melatonin 5 MG Tabs  Take 5 mg by mouth at bedtime.     metoprolol 50 MG tablet  Commonly known as:  LOPRESSOR  Take 50 mg by mouth 2 (two) times daily.     MULTI-DAY VITAMINS PO  Take 1 tablet by mouth daily.     mupirocin ointment 2 %  Commonly known as:  BACTROBAN  Place 1 application into the nose daily.     nortriptyline 25 MG capsule  Commonly known as:  PAMELOR  Take 25 mg by mouth at bedtime.     omeprazole 20 MG capsule  Commonly known as:  PRILOSEC  Take 20 mg by mouth 2 (two) times daily before a meal.     polyethylene glycol packet  Commonly known as:  MIRALAX / GLYCOLAX  Take 17 g by mouth daily.     potassium chloride SA 20 MEQ tablet  Commonly known as:  K-DUR,KLOR-CON  Take 1 tablet (20 mEq  total) by mouth daily.     sennosides-docusate sodium 8.6-50 MG tablet  Commonly known as:  SENOKOT-S  Take 1 tablet by mouth daily as needed for constipation.        Major procedures and Radiology Reports - PLEASE review detailed and final reports for all details, in brief -       Dg Chest 2 View  03/06/2015  CLINICAL DATA:  Fall. Recent shoulder fracture. Anterior lower chest wall pain. EXAM: CHEST  2 VIEW COMPARISON:  08/09/2010 chest radiograph. FINDINGS: Stable cardiomediastinal silhouette with top-normal heart size. No pneumothorax. No pleural effusion. No pulmonary edema. Mild curvilinear opacities are present at both lung bases. No displaced fracture in the visualized chest. Severe osteoarthritis in the bilateral glenohumeral joints. IMPRESSION: Mild curvilinear opacities at both lung bases, favor scarring or atelectasis. Electronically Signed   By: Ilona Sorrel M.D.   On: 03/06/2015 12:09   Dg Wrist Complete Left  03/06/2015  CLINICAL DATA:  Tripped and fell today. Initial encounter. The patient fell to her knees. Burning and pain in the left wrist. Personal history of left wrist fracture 2 years ago. EXAM: LEFT WRIST - COMPLETE 3+ VIEW COMPARISON:  Left wrist radiographs 02/22/2013 FINDINGS: Moderate osteopenia is present. Previous fracture has healed. No definite acute fracture is evident. Degenerative changes extend to the DRUJ. IMPRESSION: 1. No acute abnormality. 2. Remote fractures have healed. 3. Moderate osteopenia. Electronically Signed   By: San Morelle M.D.   On: 03/06/2015 12:10   Ct Head Wo Contrast  03/06/2015  CLINICAL DATA:  Multiple falls last week. EXAM: CT HEAD WITHOUT CONTRAST TECHNIQUE: Contiguous axial images were obtained from the base of the skull through the vertex without intravenous contrast. COMPARISON:  None. FINDINGS: Ventricles, cisterns and other CSF spaces are within normal. There is no mass, mass effect, shift of midline structures or acute  hemorrhage. No evidence of acute infarction. Minimal degenerative change of the temporomandibular joints. Remaining bones and soft tissues are within normal. IMPRESSION: No acute intracranial findings. Electronically Signed   By: Marin Olp M.D.   On: 03/06/2015 16:53   Ct Angio Chest Pe W/cm &/or Wo Cm  03/09/2015  CLINICAL DATA:  Shortness of breath starting this morning. Recent shoulder fracture. EXAM: CT ANGIOGRAPHY CHEST WITH CONTRAST TECHNIQUE: Multidetector CT imaging of the chest was performed using the standard protocol during bolus administration of intravenous contrast. Multiplanar CT image reconstructions and MIPs were obtained to evaluate the vascular anatomy. CONTRAST:  5mL OMNIPAQUE IOHEXOL 350 MG/ML SOLN COMPARISON:  03/07/2015 shoulder CT. Chest radiograph 03/06/2015. Most recent chest CT of 01/17/2011. FINDINGS: Mediastinum/Nodes: The quality of this exam for evaluation of pulmonary embolism is poor to moderate. The bolus is suboptimally timed, with contrast centered in the SVC. This is in addition to degradation secondary to mild motion and patient body habitus. No evidence of pulmonary embolism to the lobar level within the upper lobes. No large segmental embolism identified at the lung bases. Mild pulmonary artery enlargement, including a 2.9 cm left pulmonary artery. Moderate  cardiomegaly with coronary artery atherosclerosis, including within likely left main and LAD. Lipomatous hypertrophy of the interatrial septum. No mediastinal or hilar adenopathy. Lungs/Pleura: Left greater than right trace pleural thickening. No pleural fluid. Mild motion degradation. Right base scarring. Upper abdomen: Normal imaged portions of the liver, spleen, stomach, pancreas, adrenal glands, kidneys. Musculoskeletal: Advanced left glenohumeral joint osteoarthritis. Proximal left humerus fracture, suboptimally evaluated. Review of the MIP images confirms the above findings. IMPRESSION: 1. Suboptimal exam,  secondary to bolus timing, patient size, motion artifact. No evidence of pulmonary embolism with limitations above. 2. Cardiomegaly with atherosclerosis in the coronary arteries. 3. Pulmonary artery enlargement suggests pulmonary arterial hypertension. Electronically Signed   By: Abigail Miyamoto M.D.   On: 03/09/2015 14:28   Mr Brain Wo Contrast  03/07/2015  CLINICAL DATA:  Gait abnormality.  Multiple recent falls. EXAM: MRI HEAD WITHOUT CONTRAST TECHNIQUE: Multiplanar, multiecho pulse sequences of the brain and surrounding structures were obtained without intravenous contrast. COMPARISON:  Head CT 03/06/2015 FINDINGS: Mild motion artifact. There is no evidence of acute infarct, intracranial hemorrhage, mass, midline shift, or extra-axial fluid collection. Mild cerebral atrophy is within normal limits for age. Small foci of T2 hyperintensity in the subcortical and deep cerebral white matter and pons are nonspecific but compatible with mild chronic small vessel ischemic disease. Orbits are unremarkable. Paranasal sinuses and mastoid air cells are clear. Major intracranial vascular flow voids are preserved. IMPRESSION: 1. No acute intracranial abnormality. 2. Mild chronic small vessel ischemic disease. Electronically Signed   By: Logan Bores M.D.   On: 03/07/2015 12:49   Ct Shoulder Left Wo Contrast  03/07/2015  CLINICAL DATA:  Recurrent falls with left shoulder pain. Question fracture. Initial encounter. EXAM: CT OF THE LEFT SHOULDER WITHOUT CONTRAST TECHNIQUE: Multidetector CT imaging was performed according to the standard protocol. Multiplanar CT image reconstructions were also generated. COMPARISON:  Plain films left shoulder 03/06/2015. FINDINGS: The patient has a fracture of the proximal humerus which shows mild lateral displacement of approximately 0.8 cm. The fracture extends from the posterior, inferior margin of the humeral head in an anterior and inferior orientation through the surgical neck. Small  cortical break is also seen in the posterior surgical neck. The fracture does not appear to involve the greater or lesser tuberosities. No other fracture is identified. The acromioclavicular joint is intact. There is severe glenohumeral osteoarthritis with marked joint space narrowing, osteophytosis and remodeling of the glenoid. Moderate acromioclavicular degenerative change is seen. Musculature about the shoulder girdle is somewhat atrophied. No rotator cuff tear is visualized on this exam. Imaged lung parenchyma is clear. IMPRESSION: Acute fracture of the proximal humerus shows mild lateral displacement but does not appear to involve the greater or lesser tuberosities. Severe glenohumeral osteoarthritis. Moderate acromioclavicular degenerative change. Electronically Signed   By: Inge Rise M.D.   On: 03/07/2015 11:31   Nm Pulmonary Perf And Vent  03/10/2015  CLINICAL DATA:  Shortness of breath and chest pain for 1 day EXAM: NUCLEAR MEDICINE VENTILATION - PERFUSION LUNG SCAN Views: Anterior, posterior, LPO, RPO, LAO, RAO -ventilation and perfusion Radionuclide: Technetium 17m DTPA -ventilation ; Technetium 65m macroaggregated albumin-perfusion Dose:  [id number] millicurie-ventilation; 4.3 millicurie-perfusion Route of administration: Inhalation -ventilation ; intravenous -perfusion COMPARISON:  Chest CT March 09, 2015 FINDINGS: Ventilation: Radiotracer uptake is homogeneous and symmetric bilaterally. No ventilation defects are appreciable. Perfusion: Radiotracer uptake is homogeneous is symmetric bilaterally. No perfusion defects are appreciable. IMPRESSION: No appreciable ventilation or perfusion defects. This study constitutes a very low probability  of pulmonary embolus. Electronically Signed   By: Lowella Grip III M.D.   On: 03/10/2015 10:11   Dg Shoulder Left  03/06/2015  CLINICAL DATA:  Initial encounter for Pt tripped and fell this morning, she went down and hit both of her knees, then fell  and landed on her left side. Known Left shoulder Fx from 2 weeks ago, and she has a previous left wrist Fx from 2014. She has bilateral knee p.*comment was truncated* EXAM: LEFT SHOULDER - 2+ VIEW COMPARISON:  None. FINDINGS: Osteopenia. Visualized portion of the left hemithorax is normal. Advanced glenohumeral joint osteoarthritis. Undersurface acromioclavicular joint degenerative change. High-riding humeral head consistent with chronic rotator cuff insufficiency. No acute fracture or dislocation. IMPRESSION: Advanced osteoarthritis.  No acute findings. Electronically Signed   By: Abigail Miyamoto M.D.   On: 03/06/2015 12:09   Dg Knee Complete 4 Views Left  03/06/2015  CLINICAL DATA:  Knee pain secondary to a fall this morning. EXAM: LEFT KNEE - COMPLETE 4+ VIEW COMPARISON:  None. FINDINGS: There is a nondisplaced fracture of the mid patella. There is a small joint effusion. The patient has had a hemiarthroplasty in the medial compartment. There are slight degenerative changes in the patellofemoral compartment and in the lateral compartment with chondromalacia in the lateral compartment. There is slight irregularity of the lateral tibial plateau beneath the prosthesis. IMPRESSION: Acute nondisplaced fracture of the mid patella. Electronically Signed   By: Lorriane Shire M.D.   On: 03/06/2015 12:09   Dg Knee Complete 4 Views Right  03/06/2015  CLINICAL DATA:  Initial encounter for Pt tripped and fell this morning, she went down and hit both of her knees, then fell and landed on her left side. Known Left shoulder Fx from 2 weeks ago, and she has a previous left wrist Fx from 2014. She has bilateral knee p.*comment was truncated* EXAM: RIGHT KNEE - COMPLETE 4+ VIEW COMPARISON:  None. FINDINGS: Knee arthroplasty. No acute hardware complication. No joint effusion. No acute fracture or dislocation. IMPRESSION: Expected appearance after knee arthroplasty. Electronically Signed   By: Abigail Miyamoto M.D.   On: 03/06/2015  12:07   Dg Hips Bilat With Pelvis 3-4 Views  03/06/2015  CLINICAL DATA:  Fall.  Initial evaluation. EXAM: DG HIP (WITH OR WITHOUT PELVIS) 3-4V BILAT COMPARISON:  No prior exam for comparison. FINDINGS: Diffuse osteopenia degenerative change. No acute abnormality. No evidence of fracture dislocation. IMPRESSION: Diffuse osteopenia degenerative change.  No acute abnormality. Electronically Signed   By: Marcello Moores  Register   On: 03/06/2015 12:07    Micro Results     Recent Results (from the past 240 hour(s))  MRSA PCR Screening     Status: None   Collection Time: 04/03/15  2:37 PM  Result Value Ref Range Status   MRSA by PCR NEGATIVE NEGATIVE Final    Comment:        The GeneXpert MRSA Assay (FDA approved for NASAL specimens only), is one component of a comprehensive MRSA colonization surveillance program. It is not intended to diagnose MRSA infection nor to guide or monitor treatment for MRSA infections.     Today   Subjective    Sahiba Kilbane today has no headache,no chest abdominal pain,no new weakness tingling or numbness, feels much better wants to go home today.    Objective   Blood pressure 162/61, pulse 81, temperature 98.1 F (36.7 C), temperature source Axillary, resp. rate 20, height 5\' 3"  (1.6 m), weight 97.523 kg (215 lb), SpO2 67 %.  Intake/Output Summary (Last 24 hours) at 04/05/15 0843 Last data filed at 04/05/15 0045  Gross per 24 hour  Intake    103 ml  Output      0 ml  Net    103 ml    Exam Awake Alert, Oriented x 3, No new F.N deficits, Normal affect [place].AT,PERRAL Supple Neck,No JVD, No cervical lymphadenopathy appriciated.  Symmetrical Chest wall movement, Good air movement bilaterally, CTAB RRR,No Gallops,Rubs or new Murmurs, No Parasternal Heave +ve B.Sounds, Abd Soft, Non tender, No organomegaly appriciated, No rebound -guarding or rigidity. No Cyanosis, Clubbing or edema, No new Rash or bruise   Data Review   CBC w Diff: Lab Results    Component Value Date   WBC 7.2 04/04/2015   WBC 8.4 03/16/2015   HGB 9.2* 04/05/2015   HCT 28.8* 04/05/2015   PLT 277 04/04/2015   LYMPHOPCT 15 04/03/2015   MONOPCT 8 04/03/2015   EOSPCT 3 04/03/2015   BASOPCT 0 04/03/2015    CMP: Lab Results  Component Value Date   NA 132* 04/04/2015   NA 137 03/16/2015   K 4.3 04/04/2015   CL 100* 04/04/2015   CO2 24 04/04/2015   BUN 17 04/04/2015   BUN 25* 03/16/2015   CREATININE 0.66 04/04/2015   CREATININE 0.9 03/16/2015   CREATININE 0.77 02/19/2015   GLU 96 03/16/2015   PROT 5.2* 04/04/2015   ALBUMIN 2.7* 04/04/2015   BILITOT 0.3 04/04/2015   ALKPHOS 87 04/04/2015   AST 17 04/04/2015   ALT 10* 04/04/2015     Total Time in preparing paper work, data evaluation and todays exam - 35 minutes  Lala Lund K M.D on 04/05/2015 at 8:43 AM  [facility]   Office  [phone]

## 2015-04-05 NOTE — Telephone Encounter (Signed)
CM received call from patient regarding w/c, patient was discharge today to [facility] with orders for w/c. Patient reports that [facility] was supposed to deliver to her residence today. CM faxed referral in to [facility] [phone]. Made patient aware she should receive delivery tomorrow from [facility]. CM will follow up with patient tomorrow.

## 2015-04-05 NOTE — Progress Notes (Signed)
04/05/2015 RN call Social worker and was told [name] will take her back today. Rn inform patient she will be going back to [facility] today. Patient stated she wanted to stay here because its the holiday and she is still bleeding. She also said she dont want to go back to [facility] because they not going help her to the bathroom like we are. Rn text Dr Candiss Norse via [facility] and was told to transfer patient to [place] and inform Social Worker what was going on. [facility] RN.

## 2015-04-05 NOTE — Discharge Instructions (Signed)
Follow with Primary MD [name], MD in 2-3 days   Get CBC, CMP, 2 view Chest X ray checked  by Primary MD next visit.    Activity: As tolerated with Full fall precautions use walker/cane & assistance as needed   Disposition Home     Diet:  Soft Heart Healthy   For Heart failure patients - Check your Weight same time everyday, if you gain over 2 pounds, or you develop in leg swelling, experience more shortness of breath or chest pain, call your Primary MD immediately. Follow Cardiac Low Salt Diet and 1.5 lit/day fluid restriction.   On your next visit with your primary care physician please Get Medicines reviewed and adjusted.   Please request your Prim.MD to go over all Hospital Tests and Procedure/Radiological results at the follow up, please get all Hospital records sent to your Prim MD by signing hospital release before you go home.   If you experience worsening of your admission symptoms, develop shortness of breath, life threatening emergency, suicidal or homicidal thoughts you must seek medical attention immediately by calling 911 or calling your MD immediately  if symptoms less severe.  You Must read complete instructions/literature along with all the possible adverse reactions/side effects for all the Medicines you take and that have been prescribed to you. Take any new Medicines after you have completely understood and accpet all the possible adverse reactions/side effects.   Do not drive, operating heavy machinery, perform activities at heights, swimming or participation in water activities or provide baby sitting services if your were admitted for syncope or siezures until you have seen by Primary MD or a Neurologist and advised to do so again.  Do not drive when taking Pain medications.    Do not take more than prescribed Pain, Sleep and Anxiety Medications  Special Instructions: If you have smoked or chewed Tobacco  in the last 2 yrs please stop smoking, stop  any regular Alcohol  and or any Recreational drug use.  Wear Seat belts while driving.   Please note  You were cared for by a hospitalist during your hospital stay. If you have any questions about your discharge medications or the care you received while you were in the hospital after you are discharged, you can call the unit and asked to speak with the hospitalist on call if the hospitalist that took care of you is not available. Once you are discharged, your primary care physician will handle any further medical issues. Please note that NO REFILLS for any discharge medications will be authorized once you are discharged, as it is imperative that you return to your primary care physician (or establish a relationship with a primary care physician if you do not have one) for your aftercare needs so that they can reassess your need for medications and monitor your lab values.

## 2015-04-05 NOTE — Progress Notes (Addendum)
Nsg Discharge Note  Admit Date:  04/03/2015 Discharge date: 04/05/2015   [facility] to be D/C'd Skilled nursing facility plus [facility] per MD order.  AVS completed.  Copy for chart, and copy for patient signed, and dated. Patient/caregiver able to verbalize understanding.  Discharge Medication:   Medication List    STOP taking these medications        meloxicam 15 MG tablet  Commonly known as:  MOBIC      TAKE these medications        acetaminophen 325 MG tablet  Commonly known as:  TYLENOL  Take 650 mg by mouth every 4 (four) hours as needed for mild pain.     aspirin 81 MG chewable tablet  Chew 1 tablet (81 mg total) by mouth daily.  Start taking on:  04/08/2015     busPIRone 7.5 MG tablet  Commonly known as:  BUSPAR  Take 7.5 mg by mouth 2 (two) times daily. In the morning and afternoon for anxiety     busPIRone 10 MG tablet  Commonly known as:  BUSPAR  Take 10 mg by mouth at bedtime. In the evening     cholecalciferol 1000 UNITS tablet  Commonly known as:  VITAMIN D  Take 1,000 Units by mouth daily.     desmopressin 0.2 MG tablet  Commonly known as:  DDAVP  Take 0.4 mg by mouth at bedtime.     furosemide 80 MG tablet  Commonly known as:  LASIX  Take 80 mg by mouth daily. In the morning     hydrALAZINE 10 MG tablet  Commonly known as:  APRESOLINE  Take 10 mg by mouth 2 (two) times daily. HOLD for SBP <110     HYDROcodone-acetaminophen 7.5-325 MG tablet  Commonly known as:  NORCO  Take 1 tablet by mouth every 4 (four) hours as needed for moderate pain.     levothyroxine 100 MCG tablet  Commonly known as:  SYNTHROID, LEVOTHROID  Take 100 mcg by mouth daily before breakfast.     losartan 100 MG tablet  Commonly known as:  COZAAR  Take 100 mg by mouth daily.     Melatonin 5 MG Tabs  Take 5 mg by mouth at bedtime.     metoprolol 50 MG tablet  Commonly known as:  LOPRESSOR  Take 50 mg by mouth 2 (two) times daily.     MULTI-DAY VITAMINS PO  Take  1 tablet by mouth daily.     mupirocin ointment 2 %  Commonly known as:  BACTROBAN  Place 1 application into the nose daily.     nortriptyline 25 MG capsule  Commonly known as:  PAMELOR  Take 25 mg by mouth at bedtime.     omeprazole 20 MG capsule  Commonly known as:  PRILOSEC  Take 20 mg by mouth 2 (two) times daily before a meal.     polyethylene glycol packet  Commonly known as:  MIRALAX / GLYCOLAX  Take 17 g by mouth daily.     potassium chloride SA 20 MEQ tablet  Commonly known as:  K-DUR,KLOR-CON  Take 1 tablet (20 mEq total) by mouth daily.     sennosides-docusate sodium 8.6-50 MG tablet  Commonly known as:  SENOKOT-S  Take 1 tablet by mouth daily as needed for constipation.        Discharge Assessment: Filed Vitals:   04/05/15 0800 04/05/15 1117  BP: 159/62 113/65  Pulse: 68 90  Temp: 97.7 F (36.5 C)  Resp: 11 18   Skin clean, dry and intact without evidence of skin break down, no evidence of skin tears noted. IV catheter discontinued intact. Site without signs and symptoms of complications - no redness or edema noted at insertion site, patient denies c/o pain - only slight tenderness at site.  Dressing with slight pressure applied.  D/c Instructions-Education: Discharge instructions given to patient/family with verbalized understanding. D/c education completed with patient/family including follow up instructions, medication list, d/c activities limitations if indicated, with other d/c instructions as indicated by MD - patient able to verbalize understanding, all questions fully answered. Discussed weight bearing precautions with patient. Patient states "I think the doctor wants me to bear weight as tolerated." Pt. Educated about immobilizer to Left leg/precautions.  Patient instructed to return to ED, call 911, or call MD for any changes in condition.  Patient escorted via EMS, message left in attempt to give report.  Dayle Points, RN 04/05/2015 2:36 PM

## 2015-04-05 NOTE — NC FL2 (Signed)
[place] LEVEL OF CARE SCREENING TOOL     IDENTIFICATION  Patient Name: Lisa Mcclure Birthdate: 08-15-1932 Sex: female Admission Date (Current Location): 04/03/2015  [facility] and [place] Number:  Herbalist and Address:  [facility] [place], [place] [address] [place], [place] [address]      Provider Number: [id number]  Attending Physician Name and Address:  Thurnell Lose, MD  Relative Name and Phone Number:       Current Level of Care: Hospital Recommended Level of Care: [place] Prior Approval Number:    Date Approved/Denied:   PASRR Number:    Discharge Plan: Domiciliary (Rest home)    Current Diagnoses: Patient Active Problem List   Diagnosis Date Noted  . Rectal bleeding 04/03/2015  . Patellar fracture 03/06/2015  . Chronic diastolic heart failure, NYHA class 1 ([place]) 03/06/2015  . Left humeral fracture 11/04/2013  . Rib fracture 11/04/2013  . Hypothyroid 11/04/2013  . Hypertension 11/04/2013  . OSA (obstructive sleep apnea) 11/13/2008  . BOOP (bronchiolitis obliterans with organizing pneumonia) ([place]) 08/25/2008    Orientation RESPIRATION BLADDER Height & Weight    Self, Time, Situation, Place  O2 Continent 5\' 3"  (160 cm) 212 lbs.  BEHAVIORAL SYMPTOMS/MOOD NEUROLOGICAL BOWEL NUTRITION STATUS      Continent Diet  AMBULATORY STATUS COMMUNICATION OF NEEDS Skin   Limited Assist Verbally Normal                       Personal Care Assistance Level of Assistance  Bathing, Dressing, Feeding Bathing Assistance: Limited assistance Feeding assistance: Limited assistance (Assistance with set up) Dressing Assistance: Limited assistance     Functional Limitations [place]  PT (By licensed PT), OT (By licensed OT)     PT Frequency: HH - 3x weekly OT Frequency: HH - 3x weekly            Contractures      Additional Factors Info  Code Status  Code Status Info: Full             Current Medications (04/05/2015):  This is the current hospital active medication list Current Facility-Administered Medications  Medication Dose Route Frequency Provider Last Rate Last Dose  . acetaminophen (TYLENOL) tablet 650 mg  650 mg Oral Q6H PRN Samella Parr, NP   650 mg at 04/04/15 0650  . busPIRone (BUSPAR) tablet 10 mg  10 mg Oral QHS Thurnell Lose, MD   10 mg at 04/04/15 2210  . busPIRone (BUSPAR) tablet 7.5 mg  7.5 mg Oral BID Thurnell Lose, MD   7.5 mg at 04/05/15 0827  . desmopressin (DDAVP) tablet 0.4 mg  0.4 mg Oral QHS Samella Parr, NP   0.4 mg at 04/04/15 2210  . furosemide (LASIX) tablet 40 mg  40 mg Oral BID Assunta Found Stone, RPH   40 mg at 04/05/15 [id number]  . hydrALAZINE (APRESOLINE) injection 10 mg  10 mg Intravenous Q8H PRN Belkys A Regalado, MD      . HYDROcodone-acetaminophen (NORCO) 7.5-325 MG per tablet 1 tablet  1 tablet Oral Q4H PRN Thurnell Lose, MD   1 tablet at 04/05/15 0704  . levothyroxine (SYNTHROID, LEVOTHROID) tablet 100 mcg  100 mcg Oral QAC breakfast Thurnell Lose, MD   100 mcg at 04/05/15 0704  . metoprolol tartrate (LOPRESSOR) tablet 25 mg  25 mg Oral BID Samella Parr, NP   25 mg at 04/05/15 [id number]  . morphine 2 MG/ML injection 1 mg  1 mg Intravenous Q2H PRN Thurnell Lose, MD   1 mg at 04/05/15 0827  . pantoprazole (PROTONIX) EC tablet 40 mg  40 mg Oral Daily Thurnell Lose, MD   40 mg at 04/05/15 0954  . sodium chloride 0.9 % injection 3 mL  3 mL Intravenous Q12H Samella Parr, NP   3 mL at 04/05/15 1000     Discharge Medications: Please see discharge summary for a list of discharge medications.  Relevant Imaging Results:  Relevant Lab Results:   Additional Information    Dameer Speiser, Daneil Dolin, LCSW

## 2015-04-05 NOTE — Clinical Social Work Note (Signed)
Clinical Social Work Assessment  Patient Details  Name: Lisa Mcclure MRN: [id number] Date of Birth: 1933-03-22  Date of referral:  04/05/15               Reason for consult:  Facility Placement                Permission sought to share information with:    Permission granted to share information::  Yes, Verbal Permission Granted  Name::        Agency::     Relationship::     Contact Information:     Housing/Transportation Living arrangements for the past 2 months:  [place] ([place]) Source of Information:  Patient, Medical Team Patient Interpreter Needed:  None Criminal Activity/Legal Involvement Pertinent to Current Situation/Hospitalization:  No - Comment as needed Significant Relationships:    Lives with:  Facility Resident Do you feel safe going back to the place where you live?  Yes Need for family participation in patient care:  No (Coment)  Care giving concerns: Patient concerned about resolving of presenting medical issue. Patient states that she is okay with returning to the facility but needs a new wheelchair because the one that she (which belonged to her husband who died 8 years ago) is too small and she is not able to ambulate for meal time to the dining hall.    Social Worker assessment / plan:  Coordinate transport back to [facility]  Employment status:  Retired Forensic scientist:  Medicare, Managed Care PT Recommendations:  Home with [place] / Referral to community resources:     Patient/Family's Response to care: Patient and family understanding. Patient states that [name], [name] is helping her out because her daughter is overwhelmed. Son [name] present.   Patient/Family's Understanding of and Emotional Response to Diagnosis, Current Treatment, and Prognosis: Patient states that she is looking forward to mobilizing independently and feels that she will return to her baseline soon.   Emotional  Assessment Appearance:  Appears stated age Attitude/Demeanor/Rapport:   (pleasant and appropriate) Affect (typically observed):  Appropriate Orientation:  Oriented to Self, Oriented to Place, Oriented to  Time, Oriented to Situation Alcohol / Substance use:  Not Applicable Psych involvement (Current and /or in the community):  No (Comment)  Discharge Needs  Concerns to be addressed:  No discharge needs identified Readmission within the last 30 days:  No Current discharge risk:  None Barriers to Discharge:  No Barriers Identified   Christene Lye, LCSW 04/05/2015, 2:31 PM

## 2015-04-05 NOTE — NC FL2 (Deleted)
[place] LEVEL OF CARE SCREENING TOOL     IDENTIFICATION  Patient Name: Lisa Mcclure Birthdate: 1932-12-14 Sex: female Admission Date (Current Location): 04/03/2015  [facility] and [place] Number:  Herbalist and Address:  [facility] [place], [place] [address] [place], [place] [address]      Provider Number: [id number]  Attending Physician Name and Address:  Thurnell Lose, MD  Relative Name and Phone Number:       Current Level of Care: Hospital Recommended Level of Care: [place] Prior Approval Number:    Date Approved/Denied:   PASRR Number:    Discharge Plan: Domiciliary (Rest home)    Current Diagnoses: Patient Active Problem List   Diagnosis Date Noted  . Rectal bleeding 04/03/2015  . Patellar fracture 03/06/2015  . Chronic diastolic heart failure, NYHA class 1 ([place]) 03/06/2015  . Left humeral fracture 11/04/2013  . Rib fracture 11/04/2013  . Hypothyroid 11/04/2013  . Hypertension 11/04/2013  . OSA (obstructive sleep apnea) 11/13/2008  . BOOP (bronchiolitis obliterans with organizing pneumonia) ([place]) 08/25/2008    Orientation RESPIRATION BLADDER Height & Weight    Self, Time, Situation, Place  O2 Continent 5\' 3"  (160 cm) 212 lbs.  BEHAVIORAL SYMPTOMS/MOOD NEUROLOGICAL BOWEL NUTRITION STATUS      Continent Diet  AMBULATORY STATUS COMMUNICATION OF NEEDS Skin   Limited Assist Verbally Normal                       Personal Care Assistance Level of Assistance  Bathing, Dressing, Feeding Bathing Assistance: Limited assistance Feeding assistance: Limited assistance (Assistance with set up) Dressing Assistance: Limited assistance     Functional Limitations [place]  PT (By licensed PT), OT (By licensed OT)     PT Frequency: HH - 3x weekly OT Frequency: HH - 3x weekly            Contractures      Additional Factors Info  Code Status  Code Status Info: Full             Current Medications (04/05/2015):  This is the current hospital active medication list Current Facility-Administered Medications  Medication Dose Route Frequency Provider Last Rate Last Dose  . acetaminophen (TYLENOL) tablet 650 mg  650 mg Oral Q6H PRN Samella Parr, NP   650 mg at 04/04/15 0650  . busPIRone (BUSPAR) tablet 10 mg  10 mg Oral QHS Thurnell Lose, MD   10 mg at 04/04/15 2210  . busPIRone (BUSPAR) tablet 7.5 mg  7.5 mg Oral BID Thurnell Lose, MD   7.5 mg at 04/05/15 0827  . desmopressin (DDAVP) tablet 0.4 mg  0.4 mg Oral QHS Samella Parr, NP   0.4 mg at 04/04/15 2210  . furosemide (LASIX) tablet 40 mg  40 mg Oral BID Assunta Found Stone, RPH   40 mg at 04/05/15 [id number]  . hydrALAZINE (APRESOLINE) injection 10 mg  10 mg Intravenous Q8H PRN Belkys A Regalado, MD      . HYDROcodone-acetaminophen (NORCO) 7.5-325 MG per tablet 1 tablet  1 tablet Oral Q4H PRN Thurnell Lose, MD   1 tablet at 04/05/15 0704  . levothyroxine (SYNTHROID, LEVOTHROID) tablet 100 mcg  100 mcg Oral QAC breakfast Thurnell Lose, MD   100 mcg at 04/05/15 0704  . metoprolol tartrate (LOPRESSOR) tablet 25 mg  25 mg Oral BID Samella Parr, NP   25 mg at 04/05/15 [id number]  . morphine 2 MG/ML injection 1 mg  1 mg Intravenous Q2H PRN Thurnell Lose, MD   1 mg at 04/05/15 0827  . pantoprazole (PROTONIX) EC tablet 40 mg  40 mg Oral Daily Thurnell Lose, MD   40 mg at 04/05/15 0954  . sodium chloride 0.9 % injection 3 mL  3 mL Intravenous Q12H Samella Parr, NP   3 mL at 04/05/15 1000     Discharge Medications: Medication List    STOP taking these medications       meloxicam 15 MG tablet  Commonly known as: MOBIC      TAKE these medications       acetaminophen 325 MG tablet  Commonly known as: TYLENOL  Take 650 mg by mouth every 4 (four) hours as needed for mild pain.     aspirin 81 MG chewable tablet  Chew 1 tablet (81 mg total) by  mouth daily.  Start taking on: 04/08/2015     busPIRone 7.5 MG tablet  Commonly known as: BUSPAR  Take 7.5 mg by mouth 2 (two) times daily. In the morning and afternoon for anxiety     busPIRone 10 MG tablet  Commonly known as: BUSPAR  Take 10 mg by mouth at bedtime. In the evening     cholecalciferol 1000 UNITS tablet  Commonly known as: VITAMIN D  Take 1,000 Units by mouth daily.     desmopressin 0.2 MG tablet  Commonly known as: DDAVP  Take 0.4 mg by mouth at bedtime.     furosemide 80 MG tablet  Commonly known as: LASIX  Take 80 mg by mouth daily. In the morning     hydrALAZINE 10 MG tablet  Commonly known as: APRESOLINE  Take 10 mg by mouth 2 (two) times daily. HOLD for SBP <110     HYDROcodone-acetaminophen 7.5-325 MG tablet  Commonly known as: NORCO  Take 1 tablet by mouth every 4 (four) hours as needed for moderate pain.     levothyroxine 100 MCG tablet  Commonly known as: SYNTHROID, LEVOTHROID  Take 100 mcg by mouth daily before breakfast.     losartan 100 MG tablet  Commonly known as: COZAAR  Take 100 mg by mouth daily.     Melatonin 5 MG Tabs  Take 5 mg by mouth at bedtime.     metoprolol 50 MG tablet  Commonly known as: LOPRESSOR  Take 50 mg by mouth 2 (two) times daily.     MULTI-DAY VITAMINS PO  Take 1 tablet by mouth daily.     mupirocin ointment 2 %  Commonly known as: BACTROBAN  Place 1 application into the nose daily.     nortriptyline 25 MG capsule  Commonly known as: PAMELOR  Take 25 mg by mouth at bedtime.     omeprazole 20 MG capsule  Commonly known as: PRILOSEC  Take 20 mg by mouth 2 (two) times daily before a meal.     polyethylene glycol packet  Commonly known as: MIRALAX / GLYCOLAX  Take 17 g by mouth daily.     potassium chloride SA 20 MEQ tablet  Commonly known as: K-DUR,KLOR-CON  Take 1 tablet (20 mEq total) by mouth  daily.     sennosides-docusate sodium 8.6-50 MG tablet  Commonly known as: SENOKOT-S  Take 1 tablet by mouth daily as needed for constipation.  Relevant Imaging Results:  Relevant Lab Results:   Additional Information    Savannha Welle, Daneil Dolin, LCSW

## 2015-04-05 NOTE — Progress Notes (Signed)
NURSING PROGRESS NOTE  [name] [id number] Transfer Data: 04/05/2015 11:48 AM Attending Provider: Thurnell Lose, MD [id number], MD Code Status: Full  Lisa Mcclure is a 81 y.o. female patient transferred from  [facility] -No acute distress noted.  -No complaints of shortness of breath.  -No complaints of chest pain.    Blood pressure 113/65, pulse 90, temperature 97.7 F (36.5 C), temperature source Oral, resp. rate 18, height 5\' 3"  (1.6 m), weight 97.523 kg (215 lb), SpO2 98 %.   IV Fluids:  IV in place, SL.   Allergies:  Review of patient's allergies indicates no known allergies.  Past Medical History:   has a past medical history of Rheumatoid arthritis(714.0); Unspecified essential hypertension; Esophageal reflux; Hypothyroidism; Diverticulosis; Insomnia; OSA (obstructive sleep apnea); OAB (overactive bladder); LBP (low back pain); DOE (dyspnea on exertion); Shoulder pain, bilateral; GERD (gastroesophageal reflux disease); OAB (overactive bladder); Depression; Venous insufficiency; Cardiomegaly; and Osteoarthritis.  Past Surgical History:   has past surgical history that includes Appendectomy (1953); Vesicovaginal fistula closure w/ TAH; breast biopsy (Right, 1980); Knee arthroscopy w/ autogenous cartilage implantation procedure (Left, 1994, 1995); knee arthroscopy (Right, 1996, 2010); Shoulder surgery (1990); Cystocele repair; Cesarean section; Wrist surgery [id number]); Total abdominal hysterectomy (1972); bladder abduction-1996 (1996); and Cosmetic surgery (1996).  Social History:   reports that she quit smoking about 31 years ago. Her smoking use included Cigarettes. She quit after 10 years of use. She does not have any smokeless tobacco history on file. She reports that she drinks alcohol. She reports that she does not use illicit drugs.  Skin: Intact  Patient/Family orientated to room. Information packet given to patient/family. Admission inpatient armband  information verified with patient/family to include name and date of birth and placed on patient arm. Side rails up x 2, fall assessment and education completed with patient/family. Patient/family able to verbalize understanding of risk associated with falls and verbalized understanding to call for assistance before getting out of bed. Call light within reach. Patient/family able to voice and demonstrate understanding of unit orientation instructions.    Will continue to evaluate and treat per MD orders.

## 2015-04-07 ENCOUNTER — Inpatient Hospital Stay (HOSPITAL_COMMUNITY)
Admission: EM | Admit: 2015-04-07 | Discharge: 2015-04-13 | DRG: 393 | Disposition: A | Discharge status: Nursing Home (Includes ICF) | Payer: Medicare Other | Attending: Internal Medicine | Admitting: Internal Medicine

## 2015-04-07 DIAGNOSIS — F329 Major depressive disorder, single episode, unspecified: Secondary | ICD-10-CM | POA: Diagnosis present

## 2015-04-07 DIAGNOSIS — Z79899 Other long term (current) drug therapy: Secondary | ICD-10-CM

## 2015-04-07 DIAGNOSIS — K922 Gastrointestinal hemorrhage, unspecified: Secondary | ICD-10-CM | POA: Diagnosis not present

## 2015-04-07 DIAGNOSIS — Z6837 Body mass index (BMI) 37.0-37.9, adult: Secondary | ICD-10-CM | POA: Diagnosis not present

## 2015-04-07 DIAGNOSIS — R040 Epistaxis: Secondary | ICD-10-CM | POA: Diagnosis not present

## 2015-04-07 DIAGNOSIS — I5032 Chronic diastolic (congestive) heart failure: Secondary | ICD-10-CM | POA: Diagnosis present

## 2015-04-07 DIAGNOSIS — Z7982 Long term (current) use of aspirin: Secondary | ICD-10-CM

## 2015-04-07 DIAGNOSIS — F419 Anxiety disorder, unspecified: Secondary | ICD-10-CM | POA: Diagnosis present

## 2015-04-07 DIAGNOSIS — S82002A Unspecified fracture of left patella, initial encounter for closed fracture: Secondary | ICD-10-CM | POA: Diagnosis not present

## 2015-04-07 DIAGNOSIS — R58 Hemorrhage, not elsewhere classified: Secondary | ICD-10-CM | POA: Diagnosis not present

## 2015-04-07 DIAGNOSIS — Z791 Long term (current) use of non-steroidal anti-inflammatories (NSAID): Secondary | ICD-10-CM

## 2015-04-07 DIAGNOSIS — K625 Hemorrhage of anus and rectum: Secondary | ICD-10-CM | POA: Diagnosis not present

## 2015-04-07 DIAGNOSIS — K648 Other hemorrhoids: Secondary | ICD-10-CM | POA: Diagnosis not present

## 2015-04-07 DIAGNOSIS — M069 Rheumatoid arthritis, unspecified: Secondary | ICD-10-CM | POA: Diagnosis present

## 2015-04-07 DIAGNOSIS — K921 Melena: Secondary | ICD-10-CM | POA: Diagnosis not present

## 2015-04-07 DIAGNOSIS — E039 Hypothyroidism, unspecified: Secondary | ICD-10-CM | POA: Insufficient documentation

## 2015-04-07 DIAGNOSIS — G4700 Insomnia, unspecified: Secondary | ICD-10-CM | POA: Diagnosis present

## 2015-04-07 DIAGNOSIS — K219 Gastro-esophageal reflux disease without esophagitis: Secondary | ICD-10-CM | POA: Diagnosis not present

## 2015-04-07 DIAGNOSIS — M19019 Primary osteoarthritis, unspecified shoulder: Secondary | ICD-10-CM | POA: Diagnosis present

## 2015-04-07 DIAGNOSIS — R05 Cough: Secondary | ICD-10-CM | POA: Diagnosis not present

## 2015-04-07 DIAGNOSIS — S82042A Displaced comminuted fracture of left patella, initial encounter for closed fracture: Secondary | ICD-10-CM | POA: Diagnosis not present

## 2015-04-07 DIAGNOSIS — S42202D Unspecified fracture of upper end of left humerus, subsequent encounter for fracture with routine healing: Secondary | ICD-10-CM | POA: Diagnosis not present

## 2015-04-07 DIAGNOSIS — K644 Residual hemorrhoidal skin tags: Secondary | ICD-10-CM | POA: Diagnosis present

## 2015-04-07 DIAGNOSIS — F4321 Adjustment disorder with depressed mood: Secondary | ICD-10-CM | POA: Diagnosis not present

## 2015-04-07 DIAGNOSIS — R2689 Other abnormalities of gait and mobility: Secondary | ICD-10-CM | POA: Diagnosis not present

## 2015-04-07 DIAGNOSIS — Z87891 Personal history of nicotine dependence: Secondary | ICD-10-CM

## 2015-04-07 DIAGNOSIS — M79602 Pain in left arm: Secondary | ICD-10-CM | POA: Diagnosis not present

## 2015-04-07 DIAGNOSIS — I10 Essential (primary) hypertension: Secondary | ICD-10-CM | POA: Diagnosis not present

## 2015-04-07 DIAGNOSIS — S42209A Unspecified fracture of upper end of unspecified humerus, initial encounter for closed fracture: Secondary | ICD-10-CM

## 2015-04-07 DIAGNOSIS — D62 Acute posthemorrhagic anemia: Secondary | ICD-10-CM | POA: Diagnosis present

## 2015-04-07 DIAGNOSIS — K5731 Diverticulosis of large intestine without perforation or abscess with bleeding: Secondary | ICD-10-CM | POA: Diagnosis present

## 2015-04-07 DIAGNOSIS — S82002D Unspecified fracture of left patella, subsequent encounter for closed fracture with routine healing: Secondary | ICD-10-CM | POA: Diagnosis not present

## 2015-04-07 DIAGNOSIS — G4733 Obstructive sleep apnea (adult) (pediatric): Secondary | ICD-10-CM | POA: Diagnosis present

## 2015-04-07 DIAGNOSIS — S42415D Nondisplaced simple supracondylar fracture without intercondylar fracture of left humerus, subsequent encounter for fracture with routine healing: Secondary | ICD-10-CM | POA: Diagnosis not present

## 2015-04-07 DIAGNOSIS — K5909 Other constipation: Secondary | ICD-10-CM | POA: Diagnosis present

## 2015-04-07 DIAGNOSIS — W19XXXD Unspecified fall, subsequent encounter: Secondary | ICD-10-CM | POA: Diagnosis present

## 2015-04-07 DIAGNOSIS — S42292A Other displaced fracture of upper end of left humerus, initial encounter for closed fracture: Secondary | ICD-10-CM | POA: Diagnosis not present

## 2015-04-07 DIAGNOSIS — K5900 Constipation, unspecified: Secondary | ICD-10-CM | POA: Diagnosis not present

## 2015-04-07 LAB — CBC WITH DIFFERENTIAL/PLATELET
Basophils Absolute: 0 10*3/uL (ref 0.0–0.1)
Basophils Relative: 0 %
Eosinophils Absolute: 0.3 10*3/uL (ref 0.0–0.7)
Eosinophils Relative: 2 %
HCT: 24 % — ABNORMAL LOW (ref 36.0–46.0)
HEMOGLOBIN: 7.8 g/dL — AB (ref 12.0–15.0)
LYMPHS ABS: 1.8 10*3/uL (ref 0.7–4.0)
Lymphocytes Relative: 15 %
MCH: 30.4 pg (ref 26.0–34.0)
MCHC: 32.5 g/dL (ref 30.0–36.0)
MCV: 93.4 fL (ref 78.0–100.0)
MONOS PCT: 10 %
Monocytes Absolute: 1.1 10*3/uL — ABNORMAL HIGH (ref 0.1–1.0)
NEUTROS ABS: 8.7 10*3/uL — AB (ref 1.7–7.7)
NEUTROS PCT: 73 %
Platelets: 349 10*3/uL (ref 150–400)
RBC: 2.57 MIL/uL — ABNORMAL LOW (ref 3.87–5.11)
RDW: 13.4 % (ref 11.5–15.5)
WBC: 11.9 10*3/uL — ABNORMAL HIGH (ref 4.0–10.5)

## 2015-04-07 LAB — LIPASE, BLOOD: Lipase: 16 U/L (ref 11–51)

## 2015-04-07 LAB — POC OCCULT BLOOD, ED: FECAL OCCULT BLD: POSITIVE — AB

## 2015-04-07 LAB — COMPREHENSIVE METABOLIC PANEL
ALK PHOS: 100 U/L (ref 38–126)
ALT: 13 U/L — ABNORMAL LOW (ref 14–54)
ANION GAP: 10 (ref 5–15)
AST: 17 U/L (ref 15–41)
Albumin: 3.2 g/dL — ABNORMAL LOW (ref 3.5–5.0)
BILIRUBIN TOTAL: 0.5 mg/dL (ref 0.3–1.2)
BUN: 24 mg/dL — ABNORMAL HIGH (ref 6–20)
CALCIUM: 8.5 mg/dL — AB (ref 8.9–10.3)
CO2: 28 mmol/L (ref 22–32)
Chloride: 98 mmol/L — ABNORMAL LOW (ref 101–111)
Creatinine, Ser: 0.97 mg/dL (ref 0.44–1.00)
GFR, EST NON AFRICAN AMERICAN: 54 mL/min — AB (ref >60)
GLUCOSE: 117 mg/dL — AB (ref 65–99)
Potassium: 4.2 mmol/L (ref 3.5–5.1)
Sodium: 136 mmol/L (ref 135–145)
TOTAL PROTEIN: 6 g/dL — AB (ref 6.5–8.1)

## 2015-04-07 LAB — PROTIME-INR
INR: 1.3 (ref 0.00–1.49)
PROTHROMBIN TIME: 16.3 s — AB (ref 11.6–15.2)

## 2015-04-07 LAB — APTT: aPTT: 33 seconds (ref 24–37)

## 2015-04-07 LAB — ABO/RH: ABO/RH(D): B POS

## 2015-04-07 MED ORDER — PANTOPRAZOLE SODIUM 40 MG IV SOLR
40.0000 mg | Freq: Once | INTRAVENOUS | Status: AC
  Administered 2015-04-07: 40 mg via INTRAVENOUS
  Filled 2015-04-07: qty 40

## 2015-04-07 NOTE — ED Provider Notes (Signed)
CSN: [id number]     Arrival date & time 04/07/15  2046 History   First MD Initiated Contact with Patient 04/07/15 2058     Chief Complaint  Patient presents with  . Rectal Bleeding     (Consider location/radiation/quality/duration/timing/severity/associated sxs/prior Treatment) Patient is a 81 y.o. female presenting with hematochezia. The history is provided by the patient.  Rectal Bleeding Quality:  Black and tarry Amount:  Moderate Timing:  Constant Progression:  Worsening Chronicity:  Recurrent Context: not anal fissures, not anal penetration, not foreign body, not rectal injury and not rectal pain   Similar prior episodes: yes   Relieved by:  Nothing Worsened by:  Defecation Ineffective treatments:  None tried Associated symptoms: abdominal pain and light-headedness   Associated symptoms: no epistaxis, no fever, no hematemesis, no loss of consciousness and no vomiting     Past Medical History  Diagnosis Date  . Rheumatoid arthritis(714.0)   . Unspecified essential hypertension   . Esophageal reflux   . Hypothyroidism   . Diverticulosis   . Insomnia   . OSA (obstructive sleep apnea)   . OAB (overactive bladder)   . LBP (low back pain)   . DOE (dyspnea on exertion)   . Shoulder pain, bilateral   . GERD (gastroesophageal reflux disease)   . OAB (overactive bladder)   . Depression   . Venous insufficiency   . Cardiomegaly   . Osteoarthritis    Past Surgical History  Procedure Laterality Date  . Appendectomy  1953  . Vesicovaginal fistula closure w/ tah    . Breast biopsy Right 1980  . Knee arthroscopy w/ autogenous cartilage implantation (aci) procedure Left 1994, 1995  . Knee arthroscopy Right 1996, 2010  . Shoulder surgery  1990  . Cystocele repair    . Cesarean section      1957, 1961, 1964  . Wrist surgery  1967  . Total abdominal hysterectomy  1972  . Bladder abduction-1996  1996  . Cosmetic surgery  1996   Family History  Problem Relation Age of  Onset  . COPD Father   . Lung cancer Mother   . Breast cancer Maternal Aunt    Social History  Substance Use Topics  . Smoking status: Former Smoker -- 10 years    Types: Cigarettes    Quit date: 04/11/1984  . Smokeless tobacco: Not on file  . Alcohol Use: 0.0 oz/week    0 Standard drinks or equivalent per week     Comment: cocktail daily   OB History    No data available     Review of Systems  Constitutional: Positive for fatigue. Negative for fever and diaphoresis.  HENT: Negative for nosebleeds.   Eyes: Negative for pain.  Respiratory: Negative for cough and chest tightness.   Cardiovascular: Negative for chest pain.  Gastrointestinal: Positive for abdominal pain, blood in stool and hematochezia. Negative for nausea, vomiting, diarrhea, constipation and hematemesis.  Genitourinary: Negative for dysuria and flank pain.  Neurological: Positive for light-headedness. Negative for loss of consciousness, syncope, facial asymmetry and speech difficulty.      Allergies  Review of patient's allergies indicates no known allergies.  Home Medications   Prior to Admission medications   Medication Sig Start Date End Date Taking? Authorizing Provider  benzonatate (TESSALON PERLES) 100 MG capsule Take 100 mg by mouth 3 (three) times daily as needed for cough.   Yes Historical Provider, MD  cholecalciferol (VITAMIN D) 1000 UNITS tablet Take 1,000 Units by mouth daily.  Yes Historical Provider, MD  hydrALAZINE (APRESOLINE) 10 MG tablet Take 10 mg by mouth 2 (two) times daily. HOLD for SBP <110   Yes Historical Provider, MD  HYDROcodone-acetaminophen (NORCO) 7.5-325 MG tablet Take 1 tablet by mouth every 4 (four) hours as needed for moderate pain. 04/05/15  Yes Thurnell Lose, MD  levothyroxine (SYNTHROID, LEVOTHROID) 100 MCG tablet Take 100 mcg by mouth daily before breakfast.   Yes Historical Provider, MD  losartan (COZAAR) 100 MG tablet Take 100 mg by mouth daily.   Yes Historical  Provider, MD  Melatonin 5 MG TABS Take 5 mg by mouth at bedtime.    Yes Historical Provider, MD  meloxicam (MOBIC) 15 MG tablet Take 15 mg by mouth daily.   Yes Historical Provider, MD  metoprolol (LOPRESSOR) 50 MG tablet Take 50 mg by mouth 2 (two) times daily.   Yes Historical Provider, MD  Multiple Vitamin (MULTI-DAY VITAMINS PO) Take 1 tablet by mouth daily.    Yes Historical Provider, MD  mupirocin ointment (BACTROBAN) 2 % Place 1 application into the nose daily.   Yes Historical Provider, MD  nortriptyline (PAMELOR) 25 MG capsule Take 25 mg by mouth at bedtime.   Yes Historical Provider, MD  omeprazole (PRILOSEC) 20 MG capsule Take 20 mg by mouth 2 (two) times daily before a meal.    Yes Historical Provider, MD  polyethylene glycol (MIRALAX / GLYCOLAX) packet Take 17 g by mouth daily.    Yes Historical Provider, MD  sennosides-docusate sodium (SENOKOT-S) 8.6-50 MG tablet Take 1 tablet by mouth daily as needed for constipation.    Yes Historical Provider, MD  aspirin 81 MG chewable tablet Chew 1 tablet (81 mg total) by mouth daily. Patient not taking: Reported on 04/07/2015 04/08/15   Thurnell Lose, MD  potassium chloride SA (K-DUR,KLOR-CON) 20 MEQ tablet Take 1 tablet (20 mEq total) by mouth daily. Patient not taking: Reported on 04/07/2015 02/19/15   Dorothy Spark, MD   BP 118/61 mmHg  Pulse 76  Temp(Src) 98.2 F (36.8 C) (Oral)  Resp 14  Ht 5\' 4"  (1.626 m)  Wt 93.895 kg  BMI 35.51 kg/m2  SpO2 94% Physical Exam  Constitutional: She appears well-developed and well-nourished. No distress.  HENT:  Head: Head is without abrasion, without contusion and without laceration.  Mouth/Throat: Mucous membranes are dry.  Eyes: Conjunctivae and EOM are normal. Pupils are equal, round, and reactive to light.  Neck: Normal range of motion.  Cardiovascular: Normal rate and regular rhythm.  Exam reveals no decreased pulses.   No murmur heard. Pulmonary/Chest: Effort normal and breath  sounds normal.  Abdominal: Normal appearance. She exhibits no distension. There is generalized tenderness. There is no rigidity, no guarding, no tenderness at McBurney's point and negative Murphy's sign.  Genitourinary: Guaiac positive stool.  Neurological: She is alert. She has normal strength. No cranial nerve deficit or sensory deficit. GCS eye subscore is 4. GCS verbal subscore is 5. GCS motor subscore is 6.  Skin: No rash noted. There is pallor.  Psychiatric: She has a normal mood and affect. Her speech is normal and behavior is normal.    ED Course  Procedures (including critical care time) Labs Review Labs Reviewed  PROTIME-INR - Abnormal; Notable for the following:    Prothrombin Time 16.3 (*)    All other components within normal limits  CBC WITH DIFFERENTIAL/PLATELET - Abnormal; Notable for the following:    WBC 11.9 (*)    RBC 2.57 (*)  Hemoglobin 7.8 (*)    HCT 24.0 (*)    Neutro Abs 8.7 (*)    Monocytes Absolute 1.1 (*)    All other components within normal limits  COMPREHENSIVE METABOLIC PANEL - Abnormal; Notable for the following:    Chloride 98 (*)    Glucose, Bld 117 (*)    BUN 24 (*)    Calcium 8.5 (*)    Total Protein 6.0 (*)    Albumin 3.2 (*)    ALT 13 (*)    GFR calc non Af Amer 54 (*)    All other components within normal limits  POC OCCULT BLOOD, ED - Abnormal; Notable for the following:    Fecal Occult Bld POSITIVE (*)    All other components within normal limits  APTT  LIPASE, BLOOD  TYPE AND SCREEN    Imaging Review No results found. I have personally reviewed and evaluated these images and lab results as part of my medical decision-making.   EKG Interpretation None      MDM   Final diagnoses:  Rectal bleeding    81 year old Caucasian female who was recently discharged in setting of blood per rectum presents in setting of increased blood pressure. Patient reports she's had dark tarry stools increasing of the last day. She reports  lightheadedness with this as well as generalized abdominal pain. Patient was observed in the hospital with similar symptoms and had plan for outpatient gastroenterology follow-up. However due to worsening symptoms she presents today. On arrival patient is hemodynamically stable and had generalized abdominal tenderness on palpation. Patient's guaiac was positive. Patient had dark tarry stool. Considering these findings CBC, type and screen, basic laboratory analysis performed. Additionally patient given IV Protonix.  Hemoglobin returned at 7.8 which is a significant decreased compared to most recent discharge hemoglobin. In setting of continued likely GI bleed and decreased hemoglobin we will admit to medicine for further management of symptoms. Case discussed with Dr. Dreama Saa.  Attending has seen and evaluated patient and Dr. Kathrynn Humble is in agreement with plan.    Esaw Grandchild, MD 04/08/15 [place], MD 04/09/15 2337

## 2015-04-07 NOTE — Care Management (Addendum)
met with patient at bedside. Patient was discharged from [facility] inpt 12/25 to [facility] with [facility] services, Patient states, she has not received any HH services yet CM contacted the ALF, informed the Facility is contracted with [place]. Referral re-faxed to [facility]. CM was also informed patient has not received w/c either, re-faxed to [facility] received fax confirmation.  Patient returns to [facility] ED tonight c/o  Increased rectal bleeding. ED evaluation in progress, Hgb 7.7 decreased from 9.2 12/25 T&S ordered EDP will consult with Hospitalist..

## 2015-04-07 NOTE — ED Notes (Signed)
Pt here for bleeding from rectum, hx of diverticulitis, seen here for same over the weekend and did not want to leave but they discharged her on Sunday, pt had onset of left side pain today, [facility]

## 2015-04-08 ENCOUNTER — Observation Stay (HOSPITAL_COMMUNITY): Payer: Medicare Other

## 2015-04-08 DIAGNOSIS — K922 Gastrointestinal hemorrhage, unspecified: Secondary | ICD-10-CM | POA: Diagnosis present

## 2015-04-08 DIAGNOSIS — K921 Melena: Secondary | ICD-10-CM | POA: Insufficient documentation

## 2015-04-08 DIAGNOSIS — S42292A Other displaced fracture of upper end of left humerus, initial encounter for closed fracture: Secondary | ICD-10-CM | POA: Diagnosis not present

## 2015-04-08 DIAGNOSIS — S82002A Unspecified fracture of left patella, initial encounter for closed fracture: Secondary | ICD-10-CM | POA: Diagnosis not present

## 2015-04-08 LAB — COMPREHENSIVE METABOLIC PANEL
ALBUMIN: 2.9 g/dL — AB (ref 3.5–5.0)
ALK PHOS: 87 U/L (ref 38–126)
ALT: 12 U/L — ABNORMAL LOW (ref 14–54)
ANION GAP: 8 (ref 5–15)
AST: 14 U/L — ABNORMAL LOW (ref 15–41)
BUN: 20 mg/dL (ref 6–20)
CHLORIDE: 99 mmol/L — AB (ref 101–111)
CO2: 29 mmol/L (ref 22–32)
Calcium: 8.3 mg/dL — ABNORMAL LOW (ref 8.9–10.3)
Creatinine, Ser: 0.92 mg/dL (ref 0.44–1.00)
GFR calc non Af Amer: 57 mL/min — ABNORMAL LOW (ref >60)
GLUCOSE: 112 mg/dL — AB (ref 65–99)
POTASSIUM: 4.1 mmol/L (ref 3.5–5.1)
Sodium: 136 mmol/L (ref 135–145)
Total Bilirubin: 0.5 mg/dL (ref 0.3–1.2)
Total Protein: 5.3 g/dL — ABNORMAL LOW (ref 6.5–8.1)

## 2015-04-08 LAB — CBC WITH DIFFERENTIAL/PLATELET
BASOS PCT: 0 %
Basophils Absolute: 0 10*3/uL (ref 0.0–0.1)
Eosinophils Absolute: 0.2 10*3/uL (ref 0.0–0.7)
Eosinophils Relative: 2 %
HEMATOCRIT: 21.4 % — AB (ref 36.0–46.0)
HEMOGLOBIN: 7 g/dL — AB (ref 12.0–15.0)
LYMPHS ABS: 1.8 10*3/uL (ref 0.7–4.0)
LYMPHS PCT: 18 %
MCH: 30.3 pg (ref 26.0–34.0)
MCHC: 32.7 g/dL (ref 30.0–36.0)
MCV: 92.6 fL (ref 78.0–100.0)
MONO ABS: 1 10*3/uL (ref 0.1–1.0)
MONOS PCT: 10 %
NEUTROS ABS: 6.9 10*3/uL (ref 1.7–7.7)
NEUTROS PCT: 70 %
Platelets: 303 10*3/uL (ref 150–400)
RBC: 2.31 MIL/uL — ABNORMAL LOW (ref 3.87–5.11)
RDW: 13.3 % (ref 11.5–15.5)
WBC: 10 10*3/uL (ref 4.0–10.5)

## 2015-04-08 LAB — CBC
HCT: 27.4 % — ABNORMAL LOW (ref 36.0–46.0)
Hemoglobin: 9 g/dL — ABNORMAL LOW (ref 12.0–15.0)
MCH: 30.1 pg (ref 26.0–34.0)
MCHC: 32.8 g/dL (ref 30.0–36.0)
MCV: 91.6 fL (ref 78.0–100.0)
PLATELETS: 315 10*3/uL (ref 150–400)
RBC: 2.99 MIL/uL — AB (ref 3.87–5.11)
RDW: 13.5 % (ref 11.5–15.5)
WBC: 9 10*3/uL (ref 4.0–10.5)

## 2015-04-08 LAB — PREPARE RBC (CROSSMATCH)

## 2015-04-08 MED ORDER — MORPHINE SULFATE (PF) 2 MG/ML IV SOLN
2.0000 mg | INTRAVENOUS | Status: DC | PRN
  Administered 2015-04-08 – 2015-04-11 (×6): 2 mg via INTRAVENOUS
  Filled 2015-04-08 (×7): qty 1

## 2015-04-08 MED ORDER — HYDROCODONE-ACETAMINOPHEN 7.5-325 MG PO TABS
1.0000 | ORAL_TABLET | ORAL | Status: DC | PRN
  Administered 2015-04-08 – 2015-04-13 (×17): 1 via ORAL
  Filled 2015-04-08 (×17): qty 1

## 2015-04-08 MED ORDER — DESMOPRESSIN ACETATE 0.2 MG PO TABS
0.4000 mg | ORAL_TABLET | Freq: Every day | ORAL | Status: DC
  Administered 2015-04-08 – 2015-04-12 (×5): 0.4 mg via ORAL
  Filled 2015-04-08 (×7): qty 2

## 2015-04-08 MED ORDER — PANTOPRAZOLE SODIUM 40 MG IV SOLR
40.0000 mg | INTRAVENOUS | Status: DC
  Administered 2015-04-08: 40 mg via INTRAVENOUS
  Filled 2015-04-08: qty 40

## 2015-04-08 MED ORDER — LEVOTHYROXINE SODIUM 100 MCG PO TABS
100.0000 ug | ORAL_TABLET | Freq: Every day | ORAL | Status: DC
  Administered 2015-04-08 – 2015-04-13 (×6): 100 ug via ORAL
  Filled 2015-04-08 (×6): qty 1

## 2015-04-08 MED ORDER — BUSPIRONE HCL 15 MG PO TABS
7.5000 mg | ORAL_TABLET | Freq: Two times a day (BID) | ORAL | Status: DC
  Administered 2015-04-08 – 2015-04-11 (×7): 7.5 mg via ORAL
  Filled 2015-04-08 (×7): qty 1

## 2015-04-08 MED ORDER — SODIUM CHLORIDE 0.9 % IV SOLN
Freq: Once | INTRAVENOUS | Status: AC
  Administered 2015-04-08: 12:00:00 via INTRAVENOUS

## 2015-04-08 NOTE — ED Notes (Signed)
Attempted report 

## 2015-04-08 NOTE — Progress Notes (Signed)
Patient reported small maroon-colored stool around 0645 as reported by Charge RN who responded to the patient's room.  Patient complaining of back but PRN pain not yet due.  Repositioned patient and have her sit on edge of bed. Will endorse appropriately to the day shift RN.

## 2015-04-08 NOTE — H&P (Signed)
History and Physical  Lisa Mcclure Bruster [id number] DOB: 07/29/1932 DOA: 04/07/2015  PCP: [name], MD   Chief Complaint: Lower GI bleeding.   History of Present Illness:  - Patient is a 81 yo female with history of HTN, OSA, hypothyroidism, diastolic heart failure who was recently admitted for a lower GI bleeding. Patient was discharged 3 days ago. She said she continued to have bloody stool that is red with no tarry stool seen. No hematemesis. She lost her appetite but was able to tolerate food.  - She has no chest pain, dizziness, fatigue, dyspnea, N/V but complains of mild abdominal pain.   Review of Systems:  CONSTITUTIONAL:  No night sweats.  No fatigue, malaise, lethargy.  No fever or chills. Eyes:  No visual changes.  No eye pain.  No eye discharge.   ENT:    No epistaxis.  No sinus pain.  No sore throat.  No ear pain.  No congestion. RESPIRATORY:  No cough.  No wheeze.  No hemoptysis.  No shortness of breath. CARDIOVASCULAR:  No chest pains.  No palpitations. GASTROINTESTINAL:  +abdominal pain.  No nausea or vomiting.  No diarrhea or constipation.  No hematemesis.  +hematochezia.  No melena. GENITOURINARY:  No urgency.  No frequency.  No dysuria.  No hematuria.  No obstructive symptoms.  No discharge.  No pain.  No significant abnormal bleeding. MUSCULOSKELETAL:  +musculoskeletal pain.  No joint swelling.  No arthritis. NEUROLOGICAL:  No confusion.  No weakness. No headache. No seizure. PSYCHIATRIC:  No depression. No anxiety. No suicidal ideation. SKIN:  No rashes.  No lesions.  No wounds. ENDOCRINE:  No unexplained weight loss.  No polydipsia.  No polyuria.  No polyphagia. HEMATOLOGIC:  No anemia.  No purpura.  No petechiae.  No bleeding.  ALLERGIC AND IMMUNOLOGIC:  No pruritus.  No swelling Other:  Past Medical and Surgical History:   Past Medical History  Diagnosis Date  . Rheumatoid arthritis(714.0)   . Unspecified essential hypertension   .  Esophageal reflux   . Hypothyroidism   . Diverticulosis   . Insomnia   . OSA (obstructive sleep apnea)   . OAB (overactive bladder)   . LBP (low back pain)   . DOE (dyspnea on exertion)   . Shoulder pain, bilateral   . GERD (gastroesophageal reflux disease)   . OAB (overactive bladder)   . Depression   . Venous insufficiency   . Cardiomegaly   . Osteoarthritis    Past Surgical History  Procedure Laterality Date  . Appendectomy  1953  . Vesicovaginal fistula closure w/ tah    . Breast biopsy Right 1980  . Knee arthroscopy w/ autogenous cartilage implantation (aci) procedure Left 1994, 1995  . Knee arthroscopy Right 1996, 2010  . Shoulder surgery  1990  . Cystocele repair    . Cesarean section      1957, 1961, 1964  . Wrist surgery  1967  . Total abdominal hysterectomy  1972  . Bladder abduction-1996  1996  . Cosmetic surgery  1996    Social History:   reports that she quit smoking about 31 years ago. Her smoking use included Cigarettes. She quit after 10 years of use. She does not have any smokeless tobacco history on file. She reports that she drinks alcohol. She reports that she does not use illicit drugs.   No Known Allergies  Family History  Problem Relation Age of Onset  . COPD Father   . Lung cancer Mother   .  Breast cancer Maternal Aunt       Prior to Admission medications   Medication Sig Start Date End Date Taking? Authorizing Provider  benzonatate (TESSALON PERLES) 100 MG capsule Take 100 mg by mouth 3 (three) times daily as needed for cough.   Yes Historical Provider, MD  cholecalciferol (VITAMIN D) 1000 UNITS tablet Take 1,000 Units by mouth daily.   Yes Historical Provider, MD  hydrALAZINE (APRESOLINE) 10 MG tablet Take 10 mg by mouth 2 (two) times daily. HOLD for SBP <110   Yes Historical Provider, MD  HYDROcodone-acetaminophen (NORCO) 7.5-325 MG tablet Take 1 tablet by mouth every 4 (four) hours as needed for moderate pain. 04/05/15  Yes Thurnell Lose, MD  levothyroxine (SYNTHROID, LEVOTHROID) 100 MCG tablet Take 100 mcg by mouth daily before breakfast.   Yes Historical Provider, MD  losartan (COZAAR) 100 MG tablet Take 100 mg by mouth daily.   Yes Historical Provider, MD  Melatonin 5 MG TABS Take 5 mg by mouth at bedtime.    Yes Historical Provider, MD  meloxicam (MOBIC) 15 MG tablet Take 15 mg by mouth daily.   Yes Historical Provider, MD  metoprolol (LOPRESSOR) 50 MG tablet Take 50 mg by mouth 2 (two) times daily.   Yes Historical Provider, MD  Multiple Vitamin (MULTI-DAY VITAMINS PO) Take 1 tablet by mouth daily.    Yes Historical Provider, MD  mupirocin ointment (BACTROBAN) 2 % Place 1 application into the nose daily.   Yes Historical Provider, MD  nortriptyline (PAMELOR) 25 MG capsule Take 25 mg by mouth at bedtime.   Yes Historical Provider, MD  omeprazole (PRILOSEC) 20 MG capsule Take 20 mg by mouth 2 (two) times daily before a meal.    Yes Historical Provider, MD  polyethylene glycol (MIRALAX / GLYCOLAX) packet Take 17 g by mouth daily.    Yes Historical Provider, MD  sennosides-docusate sodium (SENOKOT-S) 8.6-50 MG tablet Take 1 tablet by mouth daily as needed for constipation.    Yes Historical Provider, MD  aspirin 81 MG chewable tablet Chew 1 tablet (81 mg total) by mouth daily. Patient not taking: Reported on 04/07/2015 04/08/15   Thurnell Lose, MD  potassium chloride SA (K-DUR,KLOR-CON) 20 MEQ tablet Take 1 tablet (20 mEq total) by mouth daily. Patient not taking: Reported on 04/07/2015 02/19/15   Dorothy Spark, MD    Physical Exam: BP 127/57 mmHg  Pulse 76  Temp(Src) 98.2 F (36.8 C) (Oral)  Resp 16  Ht 5\' 4"  (1.626 m)  Wt 93.895 kg (207 lb)  BMI 35.51 kg/m2  SpO2 94%  GENERAL : Well developed, well nourished, alert and cooperative, and appears to be in no acute distress. HEAD: normocephalic. EYES: PERRL, EOMI.  vision is grossly intact. EARS:hearing grossly intact. NOSE: No nasal discharge.  NECK:  Neck supple,. CARDIAC: Normal S1 and S2. No S3, S4 or murmurs. Rhythm is regular. LUNGS: Clear to auscultation ABDOMEN: Positive bowel sounds. Soft, nondistended, nontender. No guarding or rebound. No masses. NEUROLOGICAL: The mental examination revealed the patient was oriented to person, place, and time.CN II-XII intact.  SKIN: Skin normal color PSYCHIATRIC:  The patient was able to demonstrate good judgement and reason, without hallucinations, abnormal affect or abnormal behaviors during the examination. Patient is not suicidal.          Labs on Admission:  Reviewed.   Radiological Exams on Admission: No results found.   Assessment/Plan  Hematochezia:  Likely due to hemorrhoids vs. Diverticulosis Hb down from last  admission: asymptomatic anemia Will trend H&H Per ER resident, patient had tarry stool.No history of UGEI. Will continue PPI IV daily Keep NPO with GI consult in am.   HTN: hold BP meds with BP down to 90s.   Left leg and arm pain due to fx: morphine prn.    Hypothyroidism. Continue home dose Synthroid.   Chronic diastolic heart failure with chronic edema. Hold lasix for tonigh.   Obstructive sleep apnea: refuse CPAP  DVT prophylaxis: SCDs. GI prophylaxis:PPI Consultants: GI Code Status: Full     Gennaro Africa M.D [facility]

## 2015-04-08 NOTE — Progress Notes (Addendum)
Subjective: Patient admitted this morning, see detailed H&P by Dr. Dreama Saa 81 year old female with a history of hypertension, obstructive sleep apnea, hypothyroidism, diastolic heart failure who was recently discharged from the hospital after she came with lower GI bleed which had stabilized. Patient was seen by gastroenterology and plan was to follow-up as outpatient. Patient started having bloody stool at the facility. She denies dizziness, no fatigue or dyspnea. Patient recently had a fall with resultant left humerus and left patellar fracture. Patient's hemoglobin dropped to 7.0 this morning.  Filed Vitals:   04/08/15 1253 04/08/15 1441  BP: 111/50 124/45  Pulse: 105 97  Temp: 98.4 F (36.9 C) 98.2 F (36.8 C)  Resp: 20 18    Chest: Clear Bilaterally Heart : S1S2 RRR Abdomen: Soft, nontender Ext : No edema Neuro: Alert, oriented x 3  A/P Rectal bleeding Anemia Left humerus and left patellar fracture Hypothyroidism Chronic diastolic CHF Obstructive sleep apnea  Rectal bleeding Patient continues to have rectal bleeding which was thought to be due to hemorrhoids versus diverticulosis. She was seen by Dr. Oletta Lamas from [facility] in previous admission. We'll consult [facility].  Anemia Due to lower GI blood loss. Will transfuse 2 units PRBC. Follow CBC in a.m.  Left humerus and left patellar fracture Left arm in sling, left knee in the knee immobilizer. Patient will follow up with orthopedics as outpatient. Continue hydrocodone for when necessary pain control.  Hypothyroidism Continue Synthroid  Anxiety Continue BuSpar 7.5 mg twice a day    Oswald Hillock [facility] Pager- [phone]

## 2015-04-08 NOTE — Consult Note (Signed)
[facility] Gastroenterology Consultation Note  Referring Provider: Dr. Eleonore Chiquito [facility]) Primary Care Physician:  [name], MD  Reason for Consultation:  hematochezia  HPI: Lisa Mcclure is a 81 y.o. female whom we've been asked to see for hematochezia.  Patient has longstanding history of joint pains, and takes hydrocodone and meloxicam for this regularly.  She was overall doing well until one month ago.  At that time, she tripped on her carpet and fell on a couple of different occasions, injuring her left shoulder and (at a later date) fracturing her left patella.  As she was recovering from these injuries, both of which were to be managed conservatively, she began having blood in her stool about one week ago.  She describes few-to-several bloody stools per day, mostly bright red and sometimes maroon and never black, admixed with formed stool.  No abdominal pain.  Chronic constipation with straining.  No loss-of-appetite, nausea, vomiting, unintentional weight loss, dysphagia.  She describes having colonoscopy about 10 years ago showing, per patient, left-sided diverticulosis and internal hemorrhoids.  Denies prior endoscopy.  She was recently admitted last week for same symptoms, and discharged home, while patient reports she was still bleeding, and it doesn't appear that any testing was done at that time.   Past Medical History  Diagnosis Date  . Rheumatoid arthritis(714.0)   . Unspecified essential hypertension   . Esophageal reflux   . Hypothyroidism   . Diverticulosis   . Insomnia   . OSA (obstructive sleep apnea)   . OAB (overactive bladder)   . LBP (low back pain)   . DOE (dyspnea on exertion)   . Shoulder pain, bilateral   . GERD (gastroesophageal reflux disease)   . OAB (overactive bladder)   . Depression   . Venous insufficiency   . Cardiomegaly   . Osteoarthritis     Past Surgical History  Procedure Laterality Date  . Appendectomy  1953  . Vesicovaginal fistula  closure w/ tah    . Breast biopsy Right 1980  . Knee arthroscopy w/ autogenous cartilage implantation (aci) procedure Left 1994, 1995  . Knee arthroscopy Right 1996, 2010  . Shoulder surgery  1990  . Cystocele repair    . Cesarean section      1957, 1961, 1964  . Wrist surgery  1967  . Total abdominal hysterectomy  1972  . Bladder abduction-1996  1996  . Cosmetic surgery  1996    Prior to Admission medications   Medication Sig Start Date End Date Taking? Authorizing Provider  benzonatate (TESSALON PERLES) 100 MG capsule Take 100 mg by mouth 3 (three) times daily as needed for cough.   Yes Historical Provider, MD  cholecalciferol (VITAMIN D) 1000 UNITS tablet Take 1,000 Units by mouth daily.   Yes Historical Provider, MD  hydrALAZINE (APRESOLINE) 10 MG tablet Take 10 mg by mouth 2 (two) times daily. HOLD for SBP <110   Yes Historical Provider, MD  HYDROcodone-acetaminophen (NORCO) 7.5-325 MG tablet Take 1 tablet by mouth every 4 (four) hours as needed for moderate pain. 04/05/15  Yes Thurnell Lose, MD  levothyroxine (SYNTHROID, LEVOTHROID) 100 MCG tablet Take 100 mcg by mouth daily before breakfast.   Yes Historical Provider, MD  losartan (COZAAR) 100 MG tablet Take 100 mg by mouth daily.   Yes Historical Provider, MD  Melatonin 5 MG TABS Take 5 mg by mouth at bedtime.    Yes Historical Provider, MD  meloxicam (MOBIC) 15 MG tablet Take 15 mg by mouth  daily.   Yes Historical Provider, MD  metoprolol (LOPRESSOR) 50 MG tablet Take 50 mg by mouth 2 (two) times daily.   Yes Historical Provider, MD  Multiple Vitamin (MULTI-DAY VITAMINS PO) Take 1 tablet by mouth daily.    Yes Historical Provider, MD  mupirocin ointment (BACTROBAN) 2 % Place 1 application into the nose daily.   Yes Historical Provider, MD  nortriptyline (PAMELOR) 25 MG capsule Take 25 mg by mouth at bedtime.   Yes Historical Provider, MD  omeprazole (PRILOSEC) 20 MG capsule Take 20 mg by mouth 2 (two) times daily before a  meal.    Yes Historical Provider, MD  polyethylene glycol (MIRALAX / GLYCOLAX) packet Take 17 g by mouth daily.    Yes Historical Provider, MD  sennosides-docusate sodium (SENOKOT-S) 8.6-50 MG tablet Take 1 tablet by mouth daily as needed for constipation.    Yes Historical Provider, MD  aspirin 81 MG chewable tablet Chew 1 tablet (81 mg total) by mouth daily. Patient not taking: Reported on 04/07/2015 04/08/15   Thurnell Lose, MD  potassium chloride SA (K-DUR,KLOR-CON) 20 MEQ tablet Take 1 tablet (20 mEq total) by mouth daily. Patient not taking: Reported on 04/07/2015 02/19/15   Dorothy Spark, MD    Current Facility-Administered Medications  Medication Dose Route Frequency Provider Last Rate Last Dose  . busPIRone (BUSPAR) tablet 7.5 mg  7.5 mg Oral BID Oswald Hillock, MD   7.5 mg at 04/08/15 1001  . desmopressin (DDAVP) tablet 0.4 mg  0.4 mg Oral QHS Oswald Hillock, MD      . HYDROcodone-acetaminophen (NORCO) 7.5-325 MG per tablet 1 tablet  1 tablet Oral Q4H PRN Oswald Hillock, MD      . levothyroxine (SYNTHROID, LEVOTHROID) tablet 100 mcg  100 mcg Oral QAC breakfast Gennaro Africa, MD   100 mcg at 04/08/15 0810  . morphine 2 MG/ML injection 2 mg  2 mg Intravenous Q4H PRN Gennaro Africa, MD   2 mg at 04/08/15 0850  . pantoprazole (PROTONIX) injection 40 mg  40 mg Intravenous Q24H Gennaro Africa, MD   40 mg at 04/08/15 0020    Allergies as of 04/07/2015  . (No Known Allergies)    Family History  Problem Relation Age of Onset  . COPD Father   . Lung cancer Mother   . Breast cancer Maternal Aunt     Social History   Social History  . Marital Status: Widowed    Spouse Name: N/A  . Number of Children: 3  . Years of Education: College   Occupational History  . Retired    Social History Main Topics  . Smoking status: Former Smoker -- 10 years    Types: Cigarettes    Quit date: 04/11/1984  . Smokeless tobacco: Not on file  . Alcohol Use: 0.0 oz/week    0 Standard drinks or  equivalent per week     Comment: cocktail daily  . Drug Use: No  . Sexual Activity: Not on file   Other Topics Concern  . Not on file   Social History Narrative   Drinks 1 cup of coffee in the morning    Review of Systems: ROS Dr. Dreama Saa 04/08/15 reviewed and I agree  Physical Exam: Vital signs in last 24 hours: Temp:  [98.2 F (36.8 C)-98.9 F (37.2 C)] 98.2 F (36.8 C) (12/28 1441) Pulse Rate:  [76-105] 97 (12/28 1441) Resp:  [14-23] 18 (12/28 1441) BP: (97-133)/(37-87) 124/45 mmHg (12/28 1441) SpO2:  [  93 %-100 %] 93 % (12/28 1441) Weight:  [93.895 kg (207 lb)-95.5 kg (210 lb 8.6 oz)] 95.5 kg (210 lb 8.6 oz) (12/28 0130) Last BM Date: 04/07/15 General:   Alert, obese, pleasant and cooperative in NAD Head:  Normocephalic and atraumatic. Eyes:  Sclera clear, no icterus.   Conjunctiva pale Ears:  Normal auditory acuity. Nose:  No deformity, discharge,  or lesions. Mouth:  No deformity or lesions.  Oropharynx dry and pale Neck:  Supple; no masses or thyromegaly. Lungs:  Clear throughout to auscultation.   No wheezes, crackles, or rhonchi. No acute distress. Heart:  Regular rate and rhythm; II/VI flow murmur heard best RUSB; no clicks, rubs,  or gallops. Abdomen:  Soft, protuberant, nontender and nondistended. No masses, hepatosplenomegaly or hernias noted. Normal bowel sounds, without guarding, and without rebound.     Msk:  Symmetrical without gross deformities. Normal posture. Pulses:  Normal pulses noted. Extremities:  Left shoulder sling;  Left leg in soft cast; Without clubbing or edema. Neurologic:  Alert and  oriented x4; diffusely weak, otherwise grossly normal neurologically. Skin:  Pale with some ecchymoses, otherwise intact without significant lesions or rashes. Psych:  Alert and cooperative. Normal mood and affect.   Lab Results:  Recent Labs  04/07/15 2216 04/08/15 0544  WBC 11.9* 10.0  HGB 7.8* 7.0*  HCT 24.0* 21.4*  PLT 349 303   BMET  Recent  Labs  04/07/15 2216 04/08/15 0544  NA 136 136  K 4.2 4.1  CL 98* 99*  CO2 28 29  GLUCOSE 117* 112*  BUN 24* 20  CREATININE 0.97 0.92  CALCIUM 8.5* 8.3*   LFT  Recent Labs  04/08/15 0544  PROT 5.3*  ALBUMIN 2.9*  AST 14*  ALT 12*  ALKPHOS 87  BILITOT 0.5   PT/INR  Recent Labs  04/07/15 2216  LABPROT 16.3*  INR 1.30    Studies/Results: No results found.  Impression:  1.  Hematochezia. Non-destabilizing.  Subacute.  Differential considerations favor diverticulosis versus internal hemorrhoids.  Upper GI bleeding site is not favored given patient's lack of melena, essentially normal BUN and several day duration of bright red hematochezia. 2.  Acute blood loss anemia. 3.  Recent left shoulder and left patellar injury, significantly limiting patient's mobility. 4.  Chronic constipation. 5.  Chronic joint pain, history of hydrocodone and meloxicam use.  Plan:  1.  De-escalate diet to clear liquids. 2.  Tagged RBC study. 3.  Transfuse PRBCs. 4.  Volume replete and follow serial CBCs. 5.  If RBC study is positive, would immediately call interventional radiology to request angiogram for possible embolization. 6.  If RBC study is negative, and low-grade bleeding persists, would consider sigmoidoscopy as next step in management over the next couple days.  Patient, family and I very much doubt that trying to do regular bowel preparation for colonoscopy is really not feasible nor practical at this time, given patient's significant mobility troubles. 7.  Case discussed at length with patient and family, and we all agree with management plans as outlined above.   LOS: 1 day   Briggs Edelen M  04/08/2015, 2:44 PM  Pager [phone] If no answer or after 5 PM call [phone]

## 2015-04-08 NOTE — Progress Notes (Signed)
Received patient from ED. Patient AOx4, VS stable but slightly low BP at 108/37, T98.2 oral, PR 86 , RR 16, O2Sat at 96% on RA, and denies any pain.  Placed patient on telemetry per MD's order, verified by another RN and CCMD made aware.  Admissions was also made aware of this recent admission.  Patient now resting comfortably on bed with both eyes close.

## 2015-04-08 NOTE — Progress Notes (Signed)
We received phone call from patient informing us of her admission and requesting follow up for her left shoulder and left knee. We initially saw her for her nondisplaced left proximal humerus fracture after a fall 6 weeks ago. She has known preexisting severe osteoarthritis of that same joint and we have been treating her conservatively with a sling and NWB. Around 10 days later she fell again sustaining a non displaced patella fracture on the same side which has been treated with a knee immobilizer. She is now almost 5 weeks out from that injury. She is requesting revaluation as she reports having bent her left knee and having some aching there and would also like to have some increased use of her left arm if xrays show healing.  I will order repeat xrays and we will follow up tomorrow on results.

## 2015-04-09 ENCOUNTER — Inpatient Hospital Stay (HOSPITAL_COMMUNITY): Payer: Medicare Other

## 2015-04-09 DIAGNOSIS — S42309D Unspecified fracture of shaft of humerus, unspecified arm, subsequent encounter for fracture with routine healing: Secondary | ICD-10-CM

## 2015-04-09 DIAGNOSIS — K922 Gastrointestinal hemorrhage, unspecified: Secondary | ICD-10-CM

## 2015-04-09 DIAGNOSIS — M79602 Pain in left arm: Secondary | ICD-10-CM | POA: Diagnosis not present

## 2015-04-09 DIAGNOSIS — D62 Acute posthemorrhagic anemia: Secondary | ICD-10-CM

## 2015-04-09 DIAGNOSIS — I10 Essential (primary) hypertension: Secondary | ICD-10-CM

## 2015-04-09 DIAGNOSIS — Z6837 Body mass index (BMI) 37.0-37.9, adult: Secondary | ICD-10-CM | POA: Diagnosis not present

## 2015-04-09 DIAGNOSIS — S82002D Unspecified fracture of left patella, subsequent encounter for closed fracture with routine healing: Secondary | ICD-10-CM

## 2015-04-09 DIAGNOSIS — S82042A Displaced comminuted fracture of left patella, initial encounter for closed fracture: Secondary | ICD-10-CM | POA: Diagnosis not present

## 2015-04-09 DIAGNOSIS — S8290XD Unspecified fracture of unspecified lower leg, subsequent encounter for closed fracture with routine healing: Secondary | ICD-10-CM

## 2015-04-09 DIAGNOSIS — G4733 Obstructive sleep apnea (adult) (pediatric): Secondary | ICD-10-CM

## 2015-04-09 DIAGNOSIS — R2689 Other abnormalities of gait and mobility: Secondary | ICD-10-CM | POA: Diagnosis not present

## 2015-04-09 DIAGNOSIS — S42415D Nondisplaced simple supracondylar fracture without intercondylar fracture of left humerus, subsequent encounter for fracture with routine healing: Secondary | ICD-10-CM

## 2015-04-09 DIAGNOSIS — R05 Cough: Secondary | ICD-10-CM | POA: Diagnosis not present

## 2015-04-09 DIAGNOSIS — F4321 Adjustment disorder with depressed mood: Secondary | ICD-10-CM | POA: Diagnosis not present

## 2015-04-09 DIAGNOSIS — E039 Hypothyroidism, unspecified: Secondary | ICD-10-CM | POA: Insufficient documentation

## 2015-04-09 DIAGNOSIS — R040 Epistaxis: Secondary | ICD-10-CM | POA: Diagnosis not present

## 2015-04-09 LAB — TYPE AND SCREEN
ABO/RH(D): B POS
Antibody Screen: NEGATIVE
UNIT DIVISION: 0
Unit division: 0

## 2015-04-09 LAB — BASIC METABOLIC PANEL
Anion gap: 7 (ref 5–15)
BUN: 14 mg/dL (ref 6–20)
CHLORIDE: 103 mmol/L (ref 101–111)
CO2: 28 mmol/L (ref 22–32)
CREATININE: 0.8 mg/dL (ref 0.44–1.00)
Calcium: 8.4 mg/dL — ABNORMAL LOW (ref 8.9–10.3)
GFR calc non Af Amer: >60 mL/min (ref >60)
Glucose, Bld: 114 mg/dL — ABNORMAL HIGH (ref 65–99)
Potassium: 4 mmol/L (ref 3.5–5.1)
Sodium: 138 mmol/L (ref 135–145)

## 2015-04-09 LAB — CBC
HEMATOCRIT: 26.2 % — AB (ref 36.0–46.0)
HEMOGLOBIN: 8.9 g/dL — AB (ref 12.0–15.0)
MCH: 31.3 pg (ref 26.0–34.0)
MCHC: 34 g/dL (ref 30.0–36.0)
MCV: 92.3 fL (ref 78.0–100.0)
Platelets: 320 10*3/uL (ref 150–400)
RBC: 2.84 MIL/uL — ABNORMAL LOW (ref 3.87–5.11)
RDW: 14.1 % (ref 11.5–15.5)
WBC: 9.9 10*3/uL (ref 4.0–10.5)

## 2015-04-09 MED ORDER — POLYSACCHARIDE IRON COMPLEX 150 MG PO CAPS
150.0000 mg | ORAL_CAPSULE | Freq: Two times a day (BID) | ORAL | Status: DC
  Administered 2015-04-09 – 2015-04-13 (×9): 150 mg via ORAL
  Filled 2015-04-09 (×10): qty 1

## 2015-04-09 MED ORDER — BISACODYL 5 MG PO TBEC
10.0000 mg | DELAYED_RELEASE_TABLET | Freq: Once | ORAL | Status: AC
  Administered 2015-04-09: 10 mg via ORAL
  Filled 2015-04-09: qty 2

## 2015-04-09 MED ORDER — SORBITOL 70 % SOLN
960.0000 mL | TOPICAL_OIL | Freq: Once | ORAL | Status: AC
  Administered 2015-04-10: 960 mL via RECTAL
  Filled 2015-04-09: qty 240

## 2015-04-09 MED ORDER — PANTOPRAZOLE SODIUM 40 MG PO TBEC
40.0000 mg | DELAYED_RELEASE_TABLET | Freq: Every day | ORAL | Status: DC
  Administered 2015-04-09 – 2015-04-13 (×5): 40 mg via ORAL
  Filled 2015-04-09 (×5): qty 1

## 2015-04-09 MED ORDER — TECHNETIUM TC 99M-LABELED RED BLOOD CELLS IV KIT
22.9000 | PACK | Freq: Once | INTRAVENOUS | Status: AC | PRN
  Administered 2015-04-09: 22.9 via INTRAVENOUS

## 2015-04-09 NOTE — Progress Notes (Signed)
Subjective: Small ongoing hematochezia. No abdominal pain. Is hungry.  Objective: Vital signs in last 24 hours: Temp:  [97.7 F (36.5 C)-98.6 F (37 C)] 97.7 F (36.5 C) (12/29 0547) Pulse Rate:  [89-108] 102 (12/29 0547) Resp:  [18-20] 18 (12/29 0547) BP: (123-137)/(52-107) 128/69 mmHg (12/29 0547) SpO2:  [94 %-98 %] 98 % (12/29 0547) Weight change:  Last BM Date: 04/09/15  PE: GEN:  Overweight, NAD  Lab Results: CBC    Component Value Date/Time   WBC 9.9 04/09/2015 0619   WBC 8.4 03/16/2015   RBC 2.84* 04/09/2015 0619   HGB 8.9* 04/09/2015 0619   HCT 26.2* 04/09/2015 0619   PLT 320 04/09/2015 0619   MCV 92.3 04/09/2015 0619   MCH 31.3 04/09/2015 0619   MCHC 34.0 04/09/2015 0619   RDW 14.1 04/09/2015 0619   LYMPHSABS 1.8 04/08/2015 0544   MONOABS 1.0 04/08/2015 0544   EOSABS 0.2 04/08/2015 0544   BASOSABS 0.0 04/08/2015 0544   CMP     Component Value Date/Time   NA 138 04/09/2015 0619   NA 137 03/16/2015   K 4.0 04/09/2015 0619   CL 103 04/09/2015 0619   CO2 28 04/09/2015 0619   GLUCOSE 114* 04/09/2015 0619   BUN 14 04/09/2015 0619   BUN 25* 03/16/2015   CREATININE 0.80 04/09/2015 0619   CREATININE 0.9 03/16/2015   CREATININE 0.77 02/19/2015 1057   CALCIUM 8.4* 04/09/2015 0619   PROT 5.3* 04/08/2015 0544   ALBUMIN 2.9* 04/08/2015 0544   AST 14* 04/08/2015 0544   ALT 12* 04/08/2015 0544   ALKPHOS 87 04/08/2015 0544   BILITOT 0.5 04/08/2015 0544   GFRNONAA >60 04/09/2015 0619   GFRAA >60 04/09/2015 0619   Tagged RBC study, personally reviewed, no evidence of bleeding  Assessment:  1. Hematochezia. Non-destabilizing. Subacute. Differential considerations favor diverticulosis versus internal hemorrhoids. Upper GI bleeding site is not favored given patient's lack of melena, essentially normal BUN and several day duration of bright red hematochezia. 2. Acute blood loss anemia. 3. Recent left shoulder and left patellar injury, significantly  limiting patient's mobility. 4. Chronic constipation. 5. Chronic joint pain, history of hydrocodone and meloxicam use.  Plan:  1.  Full liquid diet for now. 2.  NPO after midnight. 3.  Flexible sigmoidoscopy in the morning.   Landry Dyke 04/09/2015, 3:19 PM   Pager [phone] If no answer or after 5 PM call [phone]

## 2015-04-09 NOTE — Progress Notes (Signed)
Lisa Mcclure  MRN: [id number] DOB/Age: June 21, 1932 81 y.o. Physician: Ander Slade, M.D.      Subjective: Reports being anxious to resume ad lib use of left arm and leg. Reports minimal pain Vital Signs Temp:  [97.7 F (36.5 C)-98.6 F (37 C)] 97.7 F (36.5 C) (12/29 0547) Pulse Rate:  [89-108] 102 (12/29 0547) Resp:  [18-20] 18 (12/29 0547) BP: (123-137)/(52-107) 128/69 mmHg (12/29 0547) SpO2:  [94 %-98 %] 98 % (12/29 0547)  Lab Results  Recent Labs  04/08/15 2030 04/09/15 0619  WBC 9.0 9.9  HGB 9.0* 8.9*  HCT 27.4* 26.2*  PLT 315 320   BMET  Recent Labs  04/08/15 0544 04/09/15 0619  NA 136 138  K 4.1 4.0  CL 99* 103  CO2 29 28  GLUCOSE 112* 114*  BUN 20 14  CREATININE 0.92 0.80  CALCIUM 8.3* 8.4*   INR  Date Value Ref Range Status  04/07/2015 1.30 0.00 - 1.49 Final     Exam  Minimal tenderness, stable exam orthopaedically.  xrays from yesterday show abundant callus and boney remodeling about left proximal humerus fracture, stable severe OA of glenohumeral joint. Left knee shows non dis[placed patella fracture remains in good position.  Plan May d/c sling to LUE and resume ad lib use of LUE. Preexisting end stage OA will limit ultimate capabilities with LUE, but she may use the arm for balance, ADL's, WBAT Continue knee immobilizer to LLE for 2 more weeks then my begin knee ROM F/u in my office in 2-4 weeks  Shyla Gayheart M Briley Bumgarner 04/09/2015, 3:03 PM    Contact # [phone]

## 2015-04-09 NOTE — Progress Notes (Signed)
[facility] PROGRESS NOTE  Macle Cram Agar [id number] DOB: 10/12/1932 DOA: 04/07/2015 PCP: [name], MD  Assessment/Plan: Rectal bleeding/hematochezia: subacute -VSS -patient bleeding is slowing down -bleeding scan unrevealing -GI on board, will follow rec's. Plan is for Flex sigmoidoscopy on 12/30 -ok for full liquid diet and NPO after midnight -continue PO PPI  ABLA -patient Hgb on admission 7.0 -s/p 2 units of PRBC's -will start also niferex -follow Hgb trend  HTN BP is stable, but soft -will continue holding antihypertensive agents  Hypothyroidism -will continue synthroid  Chronic diastolic heart failure: compensated -will follow strict I's and O's -daily weight -holding lasix as patient with soft BP and very limited PO intake  OSA: refuse CPAP -have discussion/education about importance of use  Left severe OA and proximal humerus fracture:  -approx 5 weeks ago after mechanical fall -patient x-ray revealing abundant calluses and boney remodeling about left proximal humerus fracture; also demonstrated severe OA of glenohumeral joint  -per ortho ok to remove sling and initiate use of her extremity as tolerated -will ask PT to evaluate   Left patella fracture: -x-ray demonstrated fracture remains in good position -plan is for WBAT, but to continue knee immobilizer for 2 more weeks  -appreciate orthopedic evaluation, assistance and rec's -will ask PT to evaluate   Code Status: Full Code Family Communication: Son at bedside  Disposition Plan: remains inpatient; bleeding scan unrevealing; will proceed with sigmoidoscopy in am (12/30)   Consultants:  GI (Dr. Paulita Fujita)  Orthopedic service (Dr. Onnie Graham)  Procedures:  Bleeding scan: No scintigraphic evidence of active GI bleeding.   Antibiotics:  None   HPI/Subjective: Afebrile, denies CP, SOB, nausea, vomiting and abd pain. Reports slow down on her bleeding, but still some blood in  stools  Objective: Filed Vitals:   04/09/15 0547 04/09/15 1640  BP: 128/69 102/82  Pulse: 102 101  Temp: 97.7 F (36.5 C) 99 F (37.2 C)  Resp: 18 18    Intake/Output Summary (Last 24 hours) at 04/09/15 1701 Last data filed at 04/09/15 0346  Gross per 24 hour  Intake      0 ml  Output      0 ml  Net      0 ml   Filed Weights   04/07/15 2059 04/08/15 0130  Weight: 93.895 kg (207 lb) 95.5 kg (210 lb 8.6 oz)    Exam:   General:  Afebrile, denies CP and SOB. Reports bleeding has slow down, but still present   Cardiovascular: S1 and S2, no rubs or gallops  Respiratory: CTA bilaterally  Abdomen: soft, NT, positive BS  Musculoskeletal: LLE with knee immobilizer in place; LUE with sling in place  Data Reviewed: Basic Metabolic Panel:  Recent Labs Lab 04/03/15 1000 04/03/15 1057 04/04/15 0405 04/07/15 2216 04/08/15 0544 04/09/15 0619  NA 133* 132* 132* 136 136 138  K 4.2 4.0 4.3 4.2 4.1 4.0  CL 95* 92* 100* 98* 99* 103  CO2 30  --  24 28 29 28   GLUCOSE 141* 129* 98 117* 112* 114*  BUN 20 22* 17 24* 20 14  CREATININE 0.75 0.80 0.66 0.97 0.92 0.80  CALCIUM 8.7*  --  8.3* 8.5* 8.3* 8.4*   Liver Function Tests:  Recent Labs Lab 04/03/15 1000 04/04/15 0405 04/07/15 2216 04/08/15 0544  AST 18 17 17  14*  ALT 11* 10* 13* 12*  ALKPHOS 119 87 100 87  BILITOT 0.4 0.3 0.5 0.5  PROT 6.4* 5.2* 6.0* 5.3*  ALBUMIN 3.2* 2.7* 3.2*  2.9*    Recent Labs Lab 04/07/15 2216  LIPASE 16   CBC:  Recent Labs Lab 04/03/15 1000  04/04/15 0405 04/05/15 0405 04/07/15 2216 30-Apr-2015 0544 Apr 30, 2015 2030 04/09/15 0619  WBC 8.9  < > 7.2  --  11.9* 10.0 9.0 9.9  NEUTROABS 6.6  --   --   --  8.7* 6.9  --   --   HGB 11.2*  < > 9.0* 9.2* 7.8* 7.0* 9.0* 8.9*  HCT 34.5*  < > 27.5* 28.8* 24.0* 21.4* 27.4* 26.2*  MCV 92.7  < > 92.9  --  93.4 92.6 91.6 92.3  PLT 331  < > 277  --  349 303 315 320  < > = values in this interval not displayed.   Recent Results (from the past  240 hour(s))  MRSA PCR Screening     Status: None   Collection Time: 04/03/15  2:37 PM  Result Value Ref Range Status   MRSA by PCR NEGATIVE NEGATIVE Final    Comment:        The GeneXpert MRSA Assay (FDA approved for NASAL specimens only), is one component of a comprehensive MRSA colonization surveillance program. It is not intended to diagnose MRSA infection nor to guide or monitor treatment for MRSA infections.      Studies: Dg Knee 1-2 Views Left  Apr 30, 2015  CLINICAL DATA:  Evaluate known patellar fracture from fall 5 weeks ago EXAM: LEFT KNEE - 1-2 VIEW COMPARISON:  03/06/2015 FINDINGS: Changes of medial compartment hemiarthroplasty, stable. Lucency through the lower pole of the left patella compatible with fracture. There has been bony resorption along the fracture line with fracture line remaining evident. No visible callus formation currently. IMPRESSION: Bony resorption along the left patellar fracture with fracture line remaining evident. No visible callus formation. Electronically Signed   By: Rolm Baptise M.D.   On: 2015-04-30 19:26   Nm Gi Blood Loss  04/09/2015  CLINICAL DATA:  Hematochezia. Inpatient. Red blood in stool 1 week prior. EXAM: NUCLEAR MEDICINE GASTROINTESTINAL BLEEDING SCAN TECHNIQUE: Sequential abdominal images were obtained following intravenous administration of Tc-77m labeled red blood cells. RADIOPHARMACEUTICALS:  22.9 mCi Tc-57m in-vitro labeled red cells. COMPARISON:  None. FINDINGS: There is no abnormal tagged red blood cell activity within the abdomen or pelvis to suggest active GI bleeding. IMPRESSION: No scintigraphic evidence of active GI bleeding. Electronically Signed   By: Ilona Sorrel M.D.   On: 04/09/2015 13:57   Dg Shoulder Left  04/30/15  CLINICAL DATA:  Proximal humeral fracture 6 weeks ago.  Pain. EXAM: LEFT SHOULDER - 2+ VIEW COMPARISON:  03/06/2015.  CT 03/07/2015. FINDINGS: Healing humeral neck fracture noted with callus formation  and sclerosis about the fracture line. The fracture line remains partially evident. Advanced degenerative changes within the left shorter. IMPRESSION: Evidence of healing left humeral neck fracture. Fracture line remains evident. Advanced degenerative changes. Electronically Signed   By: Rolm Baptise M.D.   On: 04/30/2015 19:25    Scheduled Meds: . bisacodyl  10 mg Oral Once  . busPIRone  7.5 mg Oral BID  . desmopressin  0.4 mg Oral QHS  . iron polysaccharides  150 mg Oral BID  . levothyroxine  100 mcg Oral QAC breakfast  . pantoprazole  40 mg Oral Q1200  . [START ON 04/10/2015] sorbitol, milk of mag, mineral oil, glycerin (SMOG) enema  960 mL Rectal Once    Time spent: 35 minutes    Barton Dubois  [facility] Pager [phone]. If  7PM-7AM, please contact night-coverage at [email or web address], password [email or web address] 04/09/2015, 5:01 PM  LOS: 2 days

## 2015-04-10 ENCOUNTER — Encounter (HOSPITAL_COMMUNITY): Payer: Self-pay | Admitting: *Deleted

## 2015-04-10 ENCOUNTER — Encounter (HOSPITAL_COMMUNITY)
Admission: EM | Disposition: A | Discharge status: Nursing Home (Includes ICF) | Payer: Self-pay | Source: Home / Self Care | Attending: Internal Medicine

## 2015-04-10 DIAGNOSIS — K625 Hemorrhage of anus and rectum: Secondary | ICD-10-CM

## 2015-04-10 HISTORY — PX: FLEXIBLE SIGMOIDOSCOPY: SHX5431

## 2015-04-10 LAB — BASIC METABOLIC PANEL
ANION GAP: 10 (ref 5–15)
BUN: 12 mg/dL (ref 6–20)
CALCIUM: 8.5 mg/dL — AB (ref 8.9–10.3)
CO2: 27 mmol/L (ref 22–32)
CREATININE: 0.83 mg/dL (ref 0.44–1.00)
Chloride: 101 mmol/L (ref 101–111)
GFR calc Af Amer: >60 mL/min (ref >60)
GFR calc non Af Amer: >60 mL/min (ref >60)
GLUCOSE: 115 mg/dL — AB (ref 65–99)
Potassium: 3.7 mmol/L (ref 3.5–5.1)
SODIUM: 138 mmol/L (ref 135–145)

## 2015-04-10 LAB — CBC
HCT: 26.6 % — ABNORMAL LOW (ref 36.0–46.0)
Hemoglobin: 8.6 g/dL — ABNORMAL LOW (ref 12.0–15.0)
MCH: 30.4 pg (ref 26.0–34.0)
MCHC: 32.3 g/dL (ref 30.0–36.0)
MCV: 94 fL (ref 78.0–100.0)
PLATELETS: 360 10*3/uL (ref 150–400)
RBC: 2.83 MIL/uL — AB (ref 3.87–5.11)
RDW: 14.2 % (ref 11.5–15.5)
WBC: 11.2 10*3/uL — AB (ref 4.0–10.5)

## 2015-04-10 SURGERY — SIGMOIDOSCOPY, FLEXIBLE
Anesthesia: Moderate Sedation

## 2015-04-10 MED ORDER — MIDAZOLAM HCL 10 MG/2ML IJ SOLN
INTRAMUSCULAR | Status: DC | PRN
  Administered 2015-04-10: 1 mg via INTRAVENOUS

## 2015-04-10 MED ORDER — POLYETHYLENE GLYCOL 3350 17 G PO PACK
17.0000 g | PACK | Freq: Every day | ORAL | Status: DC
  Administered 2015-04-10 – 2015-04-13 (×4): 17 g via ORAL
  Filled 2015-04-10 (×4): qty 1

## 2015-04-10 MED ORDER — MIDAZOLAM HCL 5 MG/ML IJ SOLN
INTRAMUSCULAR | Status: AC
  Filled 2015-04-10: qty 1

## 2015-04-10 MED ORDER — LORAZEPAM 2 MG/ML IJ SOLN
0.5000 mg | Freq: Once | INTRAMUSCULAR | Status: AC
  Administered 2015-04-10: 0.5 mg via INTRAVENOUS
  Filled 2015-04-10: qty 1

## 2015-04-10 MED ORDER — LOSARTAN POTASSIUM 50 MG PO TABS
100.0000 mg | ORAL_TABLET | Freq: Every day | ORAL | Status: DC
  Administered 2015-04-11 – 2015-04-13 (×3): 100 mg via ORAL
  Filled 2015-04-10 (×3): qty 2

## 2015-04-10 MED ORDER — FENTANYL CITRATE (PF) 100 MCG/2ML IJ SOLN
INTRAMUSCULAR | Status: AC
  Filled 2015-04-10: qty 2

## 2015-04-10 MED ORDER — HYDROCORTISONE 2.5 % RE CREA
TOPICAL_CREAM | Freq: Two times a day (BID) | RECTAL | Status: DC
  Administered 2015-04-10 – 2015-04-13 (×7): via RECTAL
  Filled 2015-04-10: qty 28.35

## 2015-04-10 MED ORDER — METOPROLOL TARTRATE 50 MG PO TABS
50.0000 mg | ORAL_TABLET | Freq: Two times a day (BID) | ORAL | Status: DC
  Administered 2015-04-10 – 2015-04-13 (×6): 50 mg via ORAL
  Filled 2015-04-10 (×6): qty 1

## 2015-04-10 NOTE — Clinical Social Work Note (Signed)
CSW continueing to follow patient's progress, patient is from [facility].  Jones Broom. [place], MSW, [place] 04/10/2015 1:41 PM

## 2015-04-10 NOTE — Interval H&P Note (Signed)
History and Physical Interval Note:  04/10/2015 9:52 AM  Lisa Mcclure  has presented today for surgery, with the diagnosis of hematochezia, anemia  The various methods of treatment have been discussed with the patient and family. After consideration of risks, benefits and other options for treatment, the patient has consented to  Procedure(s): FLEXIBLE SIGMOIDOSCOPY (N/A) as a surgical intervention .  The patient's history has been reviewed, patient examined, no change in status, stable for surgery.  I have reviewed the patient's chart and labs.  Questions were answered to the patient's satisfaction.     Lisa Mcclure M  Assessment:  1.  Hematochezia. 2.  Anemia.  Plan:  1.  Flexible sigmoidoscopy. 2.  Risks (bleeding, infection, bowel perforation that could require surgery, sedation-related changes in cardiopulmonary systems), benefits (identification and possible treatment of source of symptoms, exclusion of certain causes of symptoms), and alternatives (watchful waiting, radiographic imaging studies, empiric medical treatment) of flexible sigmoidoscopy were explained to patient/family in detail and patient wishes to proceed.

## 2015-04-10 NOTE — H&P (View-Only) (Signed)
[facility] Gastroenterology Consultation Note  Referring Provider: Dr. Eleonore Chiquito [facility]) Primary Care Physician:  [name], MD  Reason for Consultation:  hematochezia  HPI: Lisa Mcclure is a 81 y.o. female whom we've been asked to see for hematochezia.  Patient has longstanding history of joint pains, and takes hydrocodone and meloxicam for this regularly.  She was overall doing well until one month ago.  At that time, she tripped on her carpet and fell on a couple of different occasions, injuring her left shoulder and (at a later date) fracturing her left patella.  As she was recovering from these injuries, both of which were to be managed conservatively, she began having blood in her stool about one week ago.  She describes few-to-several bloody stools per day, mostly bright red and sometimes maroon and never black, admixed with formed stool.  No abdominal pain.  Chronic constipation with straining.  No loss-of-appetite, nausea, vomiting, unintentional weight loss, dysphagia.  She describes having colonoscopy about 10 years ago showing, per patient, left-sided diverticulosis and internal hemorrhoids.  Denies prior endoscopy.  She was recently admitted last week for same symptoms, and discharged home, while patient reports she was still bleeding, and it doesn't appear that any testing was done at that time.   Past Medical History  Diagnosis Date  . Rheumatoid arthritis(714.0)   . Unspecified essential hypertension   . Esophageal reflux   . Hypothyroidism   . Diverticulosis   . Insomnia   . OSA (obstructive sleep apnea)   . OAB (overactive bladder)   . LBP (low back pain)   . DOE (dyspnea on exertion)   . Shoulder pain, bilateral   . GERD (gastroesophageal reflux disease)   . OAB (overactive bladder)   . Depression   . Venous insufficiency   . Cardiomegaly   . Osteoarthritis     Past Surgical History  Procedure Laterality Date  . Appendectomy  1953  . Vesicovaginal fistula  closure w/ tah    . Breast biopsy Right 1980  . Knee arthroscopy w/ autogenous cartilage implantation (aci) procedure Left 1994, 1995  . Knee arthroscopy Right 1996, 2010  . Shoulder surgery  1990  . Cystocele repair    . Cesarean section      1957, 1961, 1964  . Wrist surgery  1967  . Total abdominal hysterectomy  1972  . Bladder abduction-1996  1996  . Cosmetic surgery  1996    Prior to Admission medications   Medication Sig Start Date End Date Taking? Authorizing Provider  benzonatate (TESSALON PERLES) 100 MG capsule Take 100 mg by mouth 3 (three) times daily as needed for cough.   Yes Historical Provider, MD  cholecalciferol (VITAMIN D) 1000 UNITS tablet Take 1,000 Units by mouth daily.   Yes Historical Provider, MD  hydrALAZINE (APRESOLINE) 10 MG tablet Take 10 mg by mouth 2 (two) times daily. HOLD for SBP <110   Yes Historical Provider, MD  HYDROcodone-acetaminophen (NORCO) 7.5-325 MG tablet Take 1 tablet by mouth every 4 (four) hours as needed for moderate pain. 04/05/15  Yes Thurnell Lose, MD  levothyroxine (SYNTHROID, LEVOTHROID) 100 MCG tablet Take 100 mcg by mouth daily before breakfast.   Yes Historical Provider, MD  losartan (COZAAR) 100 MG tablet Take 100 mg by mouth daily.   Yes Historical Provider, MD  Melatonin 5 MG TABS Take 5 mg by mouth at bedtime.    Yes Historical Provider, MD  meloxicam (MOBIC) 15 MG tablet Take 15 mg by mouth  daily.   Yes Historical Provider, MD  metoprolol (LOPRESSOR) 50 MG tablet Take 50 mg by mouth 2 (two) times daily.   Yes Historical Provider, MD  Multiple Vitamin (MULTI-DAY VITAMINS PO) Take 1 tablet by mouth daily.    Yes Historical Provider, MD  mupirocin ointment (BACTROBAN) 2 % Place 1 application into the nose daily.   Yes Historical Provider, MD  nortriptyline (PAMELOR) 25 MG capsule Take 25 mg by mouth at bedtime.   Yes Historical Provider, MD  omeprazole (PRILOSEC) 20 MG capsule Take 20 mg by mouth 2 (two) times daily before a  meal.    Yes Historical Provider, MD  polyethylene glycol (MIRALAX / GLYCOLAX) packet Take 17 g by mouth daily.    Yes Historical Provider, MD  sennosides-docusate sodium (SENOKOT-S) 8.6-50 MG tablet Take 1 tablet by mouth daily as needed for constipation.    Yes Historical Provider, MD  aspirin 81 MG chewable tablet Chew 1 tablet (81 mg total) by mouth daily. Patient not taking: Reported on 04/07/2015 04/08/15   Thurnell Lose, MD  potassium chloride SA (K-DUR,KLOR-CON) 20 MEQ tablet Take 1 tablet (20 mEq total) by mouth daily. Patient not taking: Reported on 04/07/2015 02/19/15   Dorothy Spark, MD    Current Facility-Administered Medications  Medication Dose Route Frequency Provider Last Rate Last Dose  . busPIRone (BUSPAR) tablet 7.5 mg  7.5 mg Oral BID Oswald Hillock, MD   7.5 mg at 04/08/15 1001  . desmopressin (DDAVP) tablet 0.4 mg  0.4 mg Oral QHS Oswald Hillock, MD      . HYDROcodone-acetaminophen (NORCO) 7.5-325 MG per tablet 1 tablet  1 tablet Oral Q4H PRN Oswald Hillock, MD      . levothyroxine (SYNTHROID, LEVOTHROID) tablet 100 mcg  100 mcg Oral QAC breakfast Gennaro Africa, MD   100 mcg at 04/08/15 0810  . morphine 2 MG/ML injection 2 mg  2 mg Intravenous Q4H PRN Gennaro Africa, MD   2 mg at 04/08/15 0850  . pantoprazole (PROTONIX) injection 40 mg  40 mg Intravenous Q24H Gennaro Africa, MD   40 mg at 04/08/15 0020    Allergies as of 04/07/2015  . (No Known Allergies)    Family History  Problem Relation Age of Onset  . COPD Father   . Lung cancer Mother   . Breast cancer Maternal Aunt     Social History   Social History  . Marital Status: Widowed    Spouse Name: N/A  . Number of Children: 3  . Years of Education: College   Occupational History  . Retired    Social History Main Topics  . Smoking status: Former Smoker -- 10 years    Types: Cigarettes    Quit date: 04/11/1984  . Smokeless tobacco: Not on file  . Alcohol Use: 0.0 oz/week    0 Standard drinks or  equivalent per week     Comment: cocktail daily  . Drug Use: No  . Sexual Activity: Not on file   Other Topics Concern  . Not on file   Social History Narrative   Drinks 1 cup of coffee in the morning    Review of Systems: ROS Dr. Dreama Saa 04/08/15 reviewed and I agree  Physical Exam: Vital signs in last 24 hours: Temp:  [98.2 F (36.8 C)-98.9 F (37.2 C)] 98.2 F (36.8 C) (12/28 1441) Pulse Rate:  [76-105] 97 (12/28 1441) Resp:  [14-23] 18 (12/28 1441) BP: (97-133)/(37-87) 124/45 mmHg (12/28 1441) SpO2:  [  93 %-100 %] 93 % (12/28 1441) Weight:  [93.895 kg (207 lb)-95.5 kg (210 lb 8.6 oz)] 95.5 kg (210 lb 8.6 oz) (12/28 0130) Last BM Date: 04/07/15 General:   Alert, obese, pleasant and cooperative in NAD Head:  Normocephalic and atraumatic. Eyes:  Sclera clear, no icterus.   Conjunctiva pale Ears:  Normal auditory acuity. Nose:  No deformity, discharge,  or lesions. Mouth:  No deformity or lesions.  Oropharynx dry and pale Neck:  Supple; no masses or thyromegaly. Lungs:  Clear throughout to auscultation.   No wheezes, crackles, or rhonchi. No acute distress. Heart:  Regular rate and rhythm; II/VI flow murmur heard best RUSB; no clicks, rubs,  or gallops. Abdomen:  Soft, protuberant, nontender and nondistended. No masses, hepatosplenomegaly or hernias noted. Normal bowel sounds, without guarding, and without rebound.     Msk:  Symmetrical without gross deformities. Normal posture. Pulses:  Normal pulses noted. Extremities:  Left shoulder sling;  Left leg in soft cast; Without clubbing or edema. Neurologic:  Alert and  oriented x4; diffusely weak, otherwise grossly normal neurologically. Skin:  Pale with some ecchymoses, otherwise intact without significant lesions or rashes. Psych:  Alert and cooperative. Normal mood and affect.   Lab Results:  Recent Labs  04/07/15 2216 04/08/15 0544  WBC 11.9* 10.0  HGB 7.8* 7.0*  HCT 24.0* 21.4*  PLT 349 303   BMET  Recent  Labs  04/07/15 2216 04/08/15 0544  NA 136 136  K 4.2 4.1  CL 98* 99*  CO2 28 29  GLUCOSE 117* 112*  BUN 24* 20  CREATININE 0.97 0.92  CALCIUM 8.5* 8.3*   LFT  Recent Labs  04/08/15 0544  PROT 5.3*  ALBUMIN 2.9*  AST 14*  ALT 12*  ALKPHOS 87  BILITOT 0.5   PT/INR  Recent Labs  04/07/15 2216  LABPROT 16.3*  INR 1.30    Studies/Results: No results found.  Impression:  1.  Hematochezia. Non-destabilizing.  Subacute.  Differential considerations favor diverticulosis versus internal hemorrhoids.  Upper GI bleeding site is not favored given patient's lack of melena, essentially normal BUN and several day duration of bright red hematochezia. 2.  Acute blood loss anemia. 3.  Recent left shoulder and left patellar injury, significantly limiting patient's mobility. 4.  Chronic constipation. 5.  Chronic joint pain, history of hydrocodone and meloxicam use.  Plan:  1.  De-escalate diet to clear liquids. 2.  Tagged RBC study. 3.  Transfuse PRBCs. 4.  Volume replete and follow serial CBCs. 5.  If RBC study is positive, would immediately call interventional radiology to request angiogram for possible embolization. 6.  If RBC study is negative, and low-grade bleeding persists, would consider sigmoidoscopy as next step in management over the next couple days.  Patient, family and I very much doubt that trying to do regular bowel preparation for colonoscopy is really not feasible nor practical at this time, given patient's significant mobility troubles. 7.  Case discussed at length with patient and family, and we all agree with management plans as outlined above.   LOS: 1 day   Ceriah Kohler M  04/08/2015, 2:44 PM  Pager [phone] If no answer or after 5 PM call [phone]

## 2015-04-10 NOTE — Progress Notes (Signed)
[facility] PROGRESS NOTE  Lisa Mcclure [id number] DOB: [birth year] DOA: 04/07/2015 PCP: [name], MD  Assessment/Plan: Rectal bleeding/hematochezia: subacute -VSS -patient bleeding continued to slow down -bleeding scan unrevealing -GI on board, status post flexible sigmoidoscopy which finding consistent with sigmoid diverticulosis and external hemorrhoid otherwise unremarkable. No active bleeding appreciated.  -Will initiate treatment with Anusol for 7 days -ok to advance to soft diet as per GI rec's -continue PO PPI  ABLA -patient Hgb on admission 7.0 -s/p 2 units of PRBC's -will continue niferex -follow Hgb trend; hemoglobin today (12/30) 8.6  HTN BP is now elevated and rising  -Worsen home antihypertensive regimen (Cozaar and metoprolol) stable, but soft  Hypothyroidism -will continue synthroid  Chronic diastolic heart failure: compensated -will follow strict I's and O's -daily weight -Will resume Lasix in the morning -No signs of fluid overload currently.  OSA: refuse CPAP -have discussion/education about importance of use  Left severe OA and proximal humerus fracture:  -approx 5 weeks ago after mechanical fall -patient x-ray revealing abundant calluses and boney remodeling about left proximal humerus fracture; also demonstrated severe OA of glenohumeral joint  -per ortho ok to remove sling and initiate use of her extremity as tolerated -will ask PT to evaluate   Left patella fracture: -x-ray demonstrated fracture remains in good position -plan is for WBAT, but to continue knee immobilizer for 2 more weeks  -appreciate orthopedic evaluation, assistance and rec's -will follow PT evaluation and recommendation  Code Status: Full Code Family Communication: Son at bedside  Disposition Plan: remains inpatient; bleeding scan unrevealing; will watch hemoglobin trend, transfuse as needed and advance diet. Hopefully home in the next 24-48  hours   Consultants:  GI (Dr. Paulita Fujita)  Orthopedic service (Dr. Onnie Graham)  Procedures:  Bleeding scan: No scintigraphic evidence of active GI bleeding.  Flexible sigmoidoscopy (12/30): Positive finding for sigmoid colon diverticulosis without active bleeding, but evidence of intramural edema and petechiae. Large external hemorrhoids. Otherwise normal exam   Antibiotics:  None   HPI/Subjective: Afebrile, denies CP, SOB, nausea, vomiting and abd pain. Somnolent after sigmoidoscopy. Reports bleeding is slowing down.   Objective: Filed Vitals:   04/10/15 1025 04/10/15 1109  BP: 188/79 172/69  Pulse: 106 113  Temp: 99.5 F (37.5 C) 98.4 F (36.9 C)  Resp: 19 20    Intake/Output Summary (Last 24 hours) at 04/10/15 1157 Last data filed at 04/10/15 0654  Gross per 24 hour  Intake    120 ml  Output    450 ml  Net   -330 ml   Filed Weights   04/07/15 2059 04/08/15 0130 04/10/15 0833  Weight: 93.895 kg (207 lb) 95.5 kg (210 lb 8.6 oz) 95.255 kg (210 lb)    Exam:   General:  Afebrile, denies CP and SOB. Patient is somnolent from versed during sigmoidoscopy; Reports bleeding has continue to slow down, but still present this morning. No abd pain  Cardiovascular: S1 and S2, no rubs or gallops  Respiratory: CTA bilaterally  Abdomen: soft, NT, positive BS  Musculoskeletal: LLE with knee immobilizer in place; LUE with sling in place  Data Reviewed: Basic Metabolic Panel:  Recent Labs Lab 04/04/15 0405 04/07/15 2216 04/08/15 0544 04/09/15 0619 04/10/15 0603  NA 132* 136 136 138 138  K 4.3 4.2 4.1 4.0 3.7  CL 100* 98* 99* 103 101  CO2 24 28 29 28 27   GLUCOSE 98 117* 112* 114* 115*  BUN 17 24* 20 14 12   CREATININE 0.66 0.97 0.92  0.80 0.83  CALCIUM 8.3* 8.5* 8.3* 8.4* 8.5*   Liver Function Tests:  Recent Labs Lab 04/04/15 0405 04/07/15 2216 2015-04-29 0544  AST 17 17 14*  ALT 10* 13* 12*  ALKPHOS 87 100 87  BILITOT 0.3 0.5 0.5  PROT 5.2* 6.0* 5.3*   ALBUMIN 2.7* 3.2* 2.9*    Recent Labs Lab 04/07/15 2216  LIPASE 16   CBC:  Recent Labs Lab 04/07/15 2216 04/29/15 0544 04/29/2015 2030 04/09/15 0619 04/10/15 0603  WBC 11.9* 10.0 9.0 9.9 11.2*  NEUTROABS 8.7* 6.9  --   --   --   HGB 7.8* 7.0* 9.0* 8.9* 8.6*  HCT 24.0* 21.4* 27.4* 26.2* 26.6*  MCV 93.4 92.6 91.6 92.3 94.0  PLT 349 303 315 320 360     Recent Results (from the past 240 hour(s))  MRSA PCR Screening     Status: None   Collection Time: 04/03/15  2:37 PM  Result Value Ref Range Status   MRSA by PCR NEGATIVE NEGATIVE Final    Comment:        The GeneXpert MRSA Assay (FDA approved for NASAL specimens only), is one component of a comprehensive MRSA colonization surveillance program. It is not intended to diagnose MRSA infection nor to guide or monitor treatment for MRSA infections.      Studies: Dg Knee 1-2 Views Left  04/29/2015  CLINICAL DATA:  Evaluate known patellar fracture from fall 5 weeks ago EXAM: LEFT KNEE - 1-2 VIEW COMPARISON:  03/06/2015 FINDINGS: Changes of medial compartment hemiarthroplasty, stable. Lucency through the lower pole of the left patella compatible with fracture. There has been bony resorption along the fracture line with fracture line remaining evident. No visible callus formation currently. IMPRESSION: Bony resorption along the left patellar fracture with fracture line remaining evident. No visible callus formation. Electronically Signed   By: Rolm Baptise M.D.   On: 2015-04-29 19:26   Nm Gi Blood Loss  04/09/2015  CLINICAL DATA:  Hematochezia. Inpatient. Red blood in stool 1 week prior. EXAM: NUCLEAR MEDICINE GASTROINTESTINAL BLEEDING SCAN TECHNIQUE: Sequential abdominal images were obtained following intravenous administration of Tc-23m labeled red blood cells. RADIOPHARMACEUTICALS:  22.9 mCi Tc-29m in-vitro labeled red cells. COMPARISON:  None. FINDINGS: There is no abnormal tagged red blood cell activity within the abdomen or  pelvis to suggest active GI bleeding. IMPRESSION: No scintigraphic evidence of active GI bleeding. Electronically Signed   By: Ilona Sorrel M.D.   On: 04/09/2015 13:57   Dg Shoulder Left  04/29/15  CLINICAL DATA:  Proximal humeral fracture 6 weeks ago.  Pain. EXAM: LEFT SHOULDER - 2+ VIEW COMPARISON:  03/06/2015.  CT 03/07/2015. FINDINGS: Healing humeral neck fracture noted with callus formation and sclerosis about the fracture line. The fracture line remains partially evident. Advanced degenerative changes within the left shorter. IMPRESSION: Evidence of healing left humeral neck fracture. Fracture line remains evident. Advanced degenerative changes. Electronically Signed   By: Rolm Baptise M.D.   On: 29-Apr-2015 19:25    Scheduled Meds: . busPIRone  7.5 mg Oral BID  . desmopressin  0.4 mg Oral QHS  . hydrocortisone   Rectal BID  . iron polysaccharides  150 mg Oral BID  . levothyroxine  100 mcg Oral QAC breakfast  . pantoprazole  40 mg Oral Q1200    Time spent: 35 minutes    Barton Dubois  [facility] Pager [phone]. If 7PM-7AM, please contact night-coverage at [email or web address], password [facility] 04/10/2015, 11:57 AM  LOS: 3 days

## 2015-04-10 NOTE — Progress Notes (Signed)
PT Cancellation Note  Patient Details Name: Lisa Mcclure MRN: [id number] DOB: [birth year]   Cancelled Treatment:    Reason Eval/Treat Not Completed: Patient at procedure or test/unavailable   Lanetta Inch Beth 04/10/2015, 8:53 AM Elwyn Reach, [place]

## 2015-04-10 NOTE — Op Note (Signed)
[facility] [address] [place], [address]   FLEX SIGMOIDOSCOPY PROCEDURE REPORT  PATIENT: Lisa Mcclure, Lisa Mcclure  MR#: [id number] BIRTHDATE: November 13, 1932 , 82  yrs. old GENDER: female ENDOSCOPIST: Arta Silence, MD REFERRED [id number] [facility] PROCEDURE DATE:  Apr 25, 2015 PROCEDURE:   Sigmoidoscopy, diagnostic INDICATIONS:hematochezia, anemia. MEDICATIONS: Versed 1 mg IV  DESCRIPTION OF PROCEDURE:    Informed consent was obtained.  Given patient's orthopedic injuries, exam had to be done with patient in near supine position.  Continuous oxygen was provided by nasal cannula and IV medicine administered through an indwelling cannula. After administration of sedation and rectal exam, the patients rectum was intubated and a diagnostic endoscope was advanced under direct visualization to the cecum.  The scope was removed slowly by carefully examining the color, texture, anatomy, and integrity mucosa on the way out.  The patient was recovered in endoscopy and discharged home in satisfactory condition. Estimated blood loss is zero unless otherwise noted in this procedure report.  FINDINGS:  Digital rectal exam showed couple large externa hemorrhoids, not actively bleeding at present.  Endoscope was passed through anus to the level of the distal descending colon. Prep quality to extent of examination was good.  There was no old or fresh blood seen to the extent of our examination.  There was extensive sigmoid diverticulosis with lots of edema in this region. Some intramural petechiae as well.  Retroflexed view of rectum was normal.  PREP QUALITY: Good  COMPLICATIONS: None immediate.  ENDOSCOPIC IMPRESSION: Sigmoid colon diverticulosis, without active bleeding, but diffuse evidence of intramural edema and petechiae. Large external hemorrhoids. No active bleeding seen at this time. Overall, I suspect large external hemorrhoids as most likely source of  bleeding.  RECOMMENDATIONS: 1.  Watch for potential complications of procedure. 2.  Anusol-HC cream topically bid x 7 days. 3.  Soft diet. 4.  Follow CBCs. 5.  Hopefully home in 1-2 days. 6.  Once patient recovers from her orthopedic injuries, patient would likely benefit from complete colonoscopy in 3-4 months to ensure no other significant colonic pathology is identied. 7.  [facility] will follow.   _______________________________ Lorrin MaisArta Silence, MD 2015/04/25 10:30 AM revised CPT CODES: ICD CODES:

## 2015-04-11 DIAGNOSIS — K648 Other hemorrhoids: Secondary | ICD-10-CM

## 2015-04-11 DIAGNOSIS — K921 Melena: Secondary | ICD-10-CM

## 2015-04-11 LAB — CBC
HCT: 24.3 % — ABNORMAL LOW (ref 36.0–46.0)
HEMATOCRIT: 23 % — AB (ref 36.0–46.0)
HEMOGLOBIN: 7.5 g/dL — AB (ref 12.0–15.0)
Hemoglobin: 8 g/dL — ABNORMAL LOW (ref 12.0–15.0)
MCH: 30.7 pg (ref 26.0–34.0)
MCH: 31.1 pg (ref 26.0–34.0)
MCHC: 32.6 g/dL (ref 30.0–36.0)
MCHC: 32.9 g/dL (ref 30.0–36.0)
MCV: 94.3 fL (ref 78.0–100.0)
MCV: 94.6 fL (ref 78.0–100.0)
PLATELETS: 382 10*3/uL (ref 150–400)
Platelets: 357 10*3/uL (ref 150–400)
RBC: 2.44 MIL/uL — AB (ref 3.87–5.11)
RBC: 2.57 MIL/uL — AB (ref 3.87–5.11)
RDW: 14 % (ref 11.5–15.5)
RDW: 14.2 % (ref 11.5–15.5)
WBC: 11.7 10*3/uL — AB (ref 4.0–10.5)
WBC: 11.1 10*3/uL — AB (ref 4.0–10.5)

## 2015-04-11 LAB — BASIC METABOLIC PANEL
ANION GAP: 7 (ref 5–15)
BUN: 9 mg/dL (ref 6–20)
CALCIUM: 8.3 mg/dL — AB (ref 8.9–10.3)
CO2: 30 mmol/L (ref 22–32)
Chloride: 98 mmol/L — ABNORMAL LOW (ref 101–111)
Creatinine, Ser: 0.81 mg/dL (ref 0.44–1.00)
GFR calc Af Amer: >60 mL/min (ref >60)
GLUCOSE: 125 mg/dL — AB (ref 65–99)
POTASSIUM: 4.2 mmol/L (ref 3.5–5.1)
SODIUM: 135 mmol/L (ref 135–145)

## 2015-04-11 MED ORDER — LORAZEPAM 0.5 MG PO TABS
0.5000 mg | ORAL_TABLET | Freq: Once | ORAL | Status: AC
  Administered 2015-04-11: 0.5 mg via ORAL
  Filled 2015-04-11: qty 1

## 2015-04-11 MED ORDER — METHOCARBAMOL 500 MG PO TABS
500.0000 mg | ORAL_TABLET | Freq: Three times a day (TID) | ORAL | Status: DC | PRN
  Administered 2015-04-11 – 2015-04-12 (×3): 500 mg via ORAL
  Filled 2015-04-11 (×3): qty 1

## 2015-04-11 MED ORDER — NORTRIPTYLINE HCL 25 MG PO CAPS
25.0000 mg | ORAL_CAPSULE | Freq: Every day | ORAL | Status: DC
  Administered 2015-04-11 – 2015-04-12 (×2): 25 mg via ORAL
  Filled 2015-04-11 (×3): qty 1

## 2015-04-11 MED ORDER — FUROSEMIDE 20 MG PO TABS
20.0000 mg | ORAL_TABLET | Freq: Every day | ORAL | Status: DC
  Administered 2015-04-11 – 2015-04-13 (×3): 20 mg via ORAL
  Filled 2015-04-11 (×3): qty 1

## 2015-04-11 MED ORDER — BUSPIRONE HCL 15 MG PO TABS
7.5000 mg | ORAL_TABLET | Freq: Three times a day (TID) | ORAL | Status: DC
  Administered 2015-04-11 – 2015-04-13 (×7): 7.5 mg via ORAL
  Filled 2015-04-11 (×7): qty 1

## 2015-04-11 NOTE — Evaluation (Signed)
Physical Therapy Evaluation/ Discharge Patient Details Name: Roxi Hlavaty MRN: [id number] DOB: 1933-01-04 Today's Date: 04/11/2015   History of Present Illness  Mariel Lukins is an 81 y.o. female with a history of Gerd,venous insufficiency ,cardiomegaly, arthritis. diverticulosis. Patella left and humerus fx left 03/01/15 during 2 different falls. Onset of BRBPR caused readmission  Clinical Impression  Angle continues to move well with hemiwalker and with Rw for limited distance. Pt with assist to don KI to LLE and aware of need for use and no ROM to knee. Pt with LUE sling off and initiating movement of LUE. Pt continues to need assist for ADLs due to ROM restrictions and impaired shoulder function. Continue to recommend return to ALF with continued HHPT. No further acute needs at this time and will sign off. Recommend daily ambulation with nursing staff.     Follow Up Recommendations Home health PT;Supervision - Intermittent;Other (comment) (ALf)    Equipment Recommendations  None recommended by PT    Recommendations for Other Services       Precautions / Restrictions Precautions Precaution Comments: able to no longer wear sling and begin LUE ROM.  Required Braces or Orthoses: Knee Immobilizer - Left Restrictions LUE Weight Bearing: Non weight bearing LLE Weight Bearing: Weight bearing as tolerated      Mobility  Bed Mobility               General bed mobility comments: standing on arrival and returned to chair  Transfers Overall transfer level: Modified independent               General transfer comment: stand to sit mod I but not seen for sit to stand  Ambulation/Gait Ambulation/Gait assistance: Supervision Ambulation Distance (Feet): 250 Feet Assistive device: Hemi-walker Gait Pattern/deviations: Step-through pattern;Decreased stride length   Gait velocity interpretation: Below normal speed for age/gender General Gait Details: LLE KI  throughout, good sequence. Pt used Rw for last 10' in room with limited weightbearing on LLE and improved control. Educated to guidance of Rw with LUE and no significant weight bearing  Stairs            Wheelchair Mobility    Modified Rankin (Stroke Patients Only)       Balance                                             Pertinent Vitals/Pain Pain Assessment: No/denies pain    Home Living Family/patient expects to be discharged to:: Assisted living Living Arrangements: Alone Available Help at Discharge: Available 24 hours/day Type of Home: Assisted living Home Access: Level entry     Home Layout: One level Home Equipment: Cane - single point;Wheelchair - manual;Hospital bed;Other (comment) Photographer)      Prior Function     Gait / Transfers Assistance Needed: pt was using wheelchair to go to dining hall as well as other places at [employer].  Pt ambulated with hemiwalker with supervision in controlled environment.  Pt could get wheelchair into bathroom on her own to get on toilet.    ADL's / Homemaking Assistance Needed: assist for bathing and dressing due to bil limited shoulder function        Hand Dominance        Extremity/Trunk Assessment   Upper Extremity Assessment: LUE deficits/detail       LUE Deficits / Details: humerus fx Nov20  just initiating movement   Lower Extremity Assessment: Generalized weakness   LLE Deficits / Details: in immobilizer at all times as patella healing.  Cervical / Trunk Assessment: Kyphotic  Communication   Communication: No difficulties  Cognition Arousal/Alertness: Awake/alert Behavior During Therapy: WFL for tasks assessed/performed Overall Cognitive Status: Within Functional Limits for tasks assessed                      General Comments      Exercises        Assessment/Plan    PT Assessment All further PT needs can be met in the next venue of care  PT Diagnosis  Generalized weakness;Difficulty walking   PT Problem List Decreased range of motion;Decreased strength;Decreased activity tolerance;Decreased balance;Decreased knowledge of use of DME  PT Treatment Interventions     PT Goals (Current goals can be found in the Care Plan section) Acute Rehab PT Goals PT Goal Formulation: All assessment and education complete, DC therapy    Frequency     Barriers to discharge        Co-evaluation               End of Session   Activity Tolerance: Patient tolerated treatment well Patient left: in chair;with call bell/phone within reach;with family/visitor present Nurse Communication: Mobility status         Time: 1110-1134 PT Time Calculation (min) (ACUTE ONLY): 24 min   Charges:   PT Evaluation $Initial PT Evaluation Tier I: 1 Procedure     PT G CodesMelford Aase 04/11/2015, 11:46 AM Elwyn Reach, [place]

## 2015-04-11 NOTE — Clinical Social Work Note (Addendum)
CSW met with patient to discuss her discharge plan.  Patient stated she is from [facility] and she plans to return back to facility with home health.  CSW attempted to contact [facility] at [phone], and left a message to find out if they can take patient over the weekend and did not hear back.  CSW to continue to follow patient's progress.  Jones Broom. [place], MSW, [place] 04/11/2015 1:07 PM

## 2015-04-11 NOTE — Clinical Social Work Note (Addendum)
Clinical Social Work Assessment  Patient Details  Name: Lisa Mcclure MRN: [id number] Date of Birth: 09/02/1932  Date of referral:  04/11/15               Reason for consult:  Facility Placement                Permission sought to share information with:  Chartered certified accountant granted to share information::  Yes, Verbal Permission Granted  Name::        Agency::  [facility]  Relationship::     Contact Information:     Housing/Transportation Living arrangements for the past 2 months:  [place] of Information:  Patient Patient Interpreter Needed:  None Criminal Activity/Legal Involvement Pertinent to Current Situation/Hospitalization:  No - Comment as needed Significant Relationships:  Adult Children Lives with:  Facility Resident Do you feel safe going back to the place where you live?  Yes Need for family participation in patient care:  No (Coment)  Care giving concerns:  Patient plans to return back to [facility]   Social Worker assessment / plan:  Patient is a 81 year old female who is from [facility], she is alert and oriented x4.  Patient expressed that she feels comfortable returning back to ALF.  Patient did not have any family at bedside, patient expressed that she is looking forward to returning back home.  Patient did not express any concerns about returning back home.  Patient was sitting up in her chair and expressed gratitude for CSW talking to her about returning to [facility].  Employment status:  Retired Health visitor, Managed Care PT Recommendations:  Home with [place] / Referral to community resources:     Patient/Family's Response to care:  Patient agreeable to returning back to [facility].  Patient/Family's Understanding of and Emotional Response to Diagnosis, Current Treatment, and Prognosis:  Patient is aware of her current treatment and understanding of  diagnosis.  Emotional Assessment Appearance:  Appears stated age Attitude/Demeanor/Rapport:    Affect (typically observed):  Appropriate, Pleasant Orientation:  Oriented to Self, Oriented to Place, Oriented to  Time, Oriented to Situation Alcohol / Substance use:  Not Applicable Psych involvement (Current and /or in the community):  No (Comment)  Discharge Needs  Concerns to be addressed:  No discharge needs identified Readmission within the last 30 days:  Yes (04/04/16 to [facility]) Current discharge risk:  None Barriers to Discharge:  No Barriers Identified   Ross Ludwig, LCSWA 04/11/2015, 11:10 PM

## 2015-04-11 NOTE — Progress Notes (Signed)
GASTROENTEROLOGY PROGRESS NOTE  Problem:   Hematochezia, anemia  Subjective: No bowel movements today. However, has felt more lightheaded than ever.  Objective:  Pt is sitting on the side of the bed, in NAD, very articulate.  Skin warm, radial pulse is strong and full, HR nl (about 75).  Hgb essent stable x 48hr post tx 2 u PRC's, but then fell by 1 gm this afternoon.  Assessment: Am concerned about re-bleed.  This is sounding more and more like diverticular bleeding, especially if the current sx indicate a re-bleed in the absence of active rectal outlet bleeding.  Other etiologies (eg, PUD from previous Mobic and ASA) also need to be kept in mind as possibilities.  Plan: Check CBC and BMET now to establish trend.  Pt does not appear ill and does not appear to be briskly bleeding, so I don't think repeat bleeding scan or arteriography are called for at this time, even if there has been a further drop in hgb.   Consider egd if significant bump in BUN.  Lisa Mcclure, M.D. 04/11/2015 10:14 AM  Pager [phone] If no answer or after 5 PM call [phone]

## 2015-04-11 NOTE — Progress Notes (Signed)
[facility] PROGRESS NOTE  Lisa Mcclure Escher [id number] DOB: 06/13/1932 DOA: 04/07/2015 PCP: [name], MD  Assessment/Plan: Rectal bleeding/hematochezia: subacute -VSS -patient bleeding continued to slow down -bleeding scan unrevealing -GI on board, status post flexible sigmoidoscopy which finding consistent with sigmoid diverticulosis and external hemorrhoid otherwise unremarkable. No active bleeding appreciated.  -Will continue treatment with Anusol BID for 7 days -ok to advance to soft diet as per GI rec's -continue PO PPI  ABLA -patient Hgb on admission 7.0 -s/p 2 units of PRBC's -will continue niferex -follow Hgb trend; hemoglobin today pending, (12/31); will follow results and determine if further transfusion needed  HTN BP is now elevated and rising  -Worsen home antihypertensive regimen (Cozaar and metoprolol) stable, but soft  Hypothyroidism -will continue synthroid  Chronic diastolic heart failure: compensated -will follow strict I's and O's -daily weight -Will resume Lasix at 20mg  daily -No signs of fluid overload currently.  OSA: refuse CPAP -have discussion/education about importance of use -will follow with PCP about this and discussed availability for nasal prong   Left severe OA and proximal humerus fracture:  -approx 5 weeks ago after mechanical fall -patient x-ray revealing abundant calluses and boney remodeling about left proximal humerus fracture; also demonstrated severe OA of glenohumeral joint  -per ortho ok to remove sling and initiate use of her extremity as tolerated -will ask PT to evaluate and provide rec's   Left patella fracture: -x-ray demonstrated fracture remains in good position -plan is for WBAT, but to continue knee immobilizer for 2 more weeks  -appreciate orthopedic evaluation, assistance and rec's -will follow PT evaluation and recommendation  Anxiety/depression/insomnia: -will resume home meds  Code  Status: Full Code Family Communication: Son at bedside  Disposition Plan: remains inpatient; bleeding scan unrevealing; will watch hemoglobin trend, transfuse as needed and advance diet. Hopefully home in the next 24-48 hours   Consultants:  GI (Dr. Paulita Fujita)  Orthopedic service (Dr. Onnie Graham)  Procedures:  Bleeding scan: No scintigraphic evidence of active GI bleeding.  Flexible sigmoidoscopy (12/30): Positive finding for sigmoid colon diverticulosis without active bleeding, but evidence of intramural edema and petechiae. Large external hemorrhoids. Otherwise normal exam   Antibiotics:  None   HPI/Subjective: Afebrile, denies CP, SOB, nausea, vomiting and abd pain. No further bleeding reported sine 12/30  Objective: Filed Vitals:   04/10/15 2210 04/11/15 0603  BP: 141/77 162/68  Pulse: 109 70  Temp:  97.6 F (36.4 C)  Resp: 19 19    Intake/Output Summary (Last 24 hours) at 04/11/15 1037 Last data filed at 04/11/15 0604  Gross per 24 hour  Intake    940 ml  Output   1050 ml  Net   -110 ml   Filed Weights   04/07/15 2059 04/08/15 0130 04/10/15 0833  Weight: 93.895 kg (207 lb) 95.5 kg (210 lb 8.6 oz) 95.255 kg (210 lb)    Exam:   General:  Afebrile, denies CP and SOB. Patient is AAOX3, complaining of back pain and spasm on her LLE. No further bleeding reported since 12/30. No abd pain  Cardiovascular: S1 and S2, no rubs or gallops  Respiratory: CTA bilaterally  Abdomen: soft, NT, positive BS  Musculoskeletal: LLE with knee immobilizer in place; LUE without sling now and able to move it w/o to much difficulty.  Data Reviewed: Basic Metabolic Panel:  Recent Labs Lab 04/07/15 2216 04/08/15 0544 04/09/15 0619 04/10/15 0603  NA 136 136 138 138  K 4.2 4.1 4.0 3.7  CL 98* 99* 103  101  CO2 28 29 28 27   GLUCOSE 117* 112* 114* 115*  BUN 24* 20 14 12   CREATININE 0.97 0.92 0.80 0.83  CALCIUM 8.5* 8.3* 8.4* 8.5*   Liver Function Tests:  Recent Labs Lab  04/07/15 2216 04/08/15 0544  AST 17 14*  ALT 13* 12*  ALKPHOS 100 87  BILITOT 0.5 0.5  PROT 6.0* 5.3*  ALBUMIN 3.2* 2.9*    Recent Labs Lab 04/07/15 2216  LIPASE 16   CBC:  Recent Labs Lab 04/07/15 2216 04/08/15 0544 04/08/15 2030 04/09/15 0619 04/10/15 0603  WBC 11.9* 10.0 9.0 9.9 11.2*  NEUTROABS 8.7* 6.9  --   --   --   HGB 7.8* 7.0* 9.0* 8.9* 8.6*  HCT 24.0* 21.4* 27.4* 26.2* 26.6*  MCV 93.4 92.6 91.6 92.3 94.0  PLT 349 303 315 320 360     Recent Results (from the past 240 hour(s))  MRSA PCR Screening     Status: None   Collection Time: 04/03/15  2:37 PM  Result Value Ref Range Status   MRSA by PCR NEGATIVE NEGATIVE Final    Comment:        The GeneXpert MRSA Assay (FDA approved for NASAL specimens only), is one component of a comprehensive MRSA colonization surveillance program. It is not intended to diagnose MRSA infection nor to guide or monitor treatment for MRSA infections.      Studies: Nm Gi Blood Loss  04/09/2015  CLINICAL DATA:  Hematochezia. Inpatient. Red blood in stool 1 week prior. EXAM: NUCLEAR MEDICINE GASTROINTESTINAL BLEEDING SCAN TECHNIQUE: Sequential abdominal images were obtained following intravenous administration of Tc-16m labeled red blood cells. RADIOPHARMACEUTICALS:  22.9 mCi Tc-19m in-vitro labeled red cells. COMPARISON:  None. FINDINGS: There is no abnormal tagged red blood cell activity within the abdomen or pelvis to suggest active GI bleeding. IMPRESSION: No scintigraphic evidence of active GI bleeding. Electronically Signed   By: Ilona Sorrel M.D.   On: 04/09/2015 13:57    Scheduled Meds: . busPIRone  7.5 mg Oral BID  . desmopressin  0.4 mg Oral QHS  . hydrocortisone   Rectal BID  . iron polysaccharides  150 mg Oral BID  . levothyroxine  100 mcg Oral QAC breakfast  . losartan  100 mg Oral Daily  . metoprolol  50 mg Oral BID  . pantoprazole  40 mg Oral Q1200  . polyethylene glycol  17 g Oral Daily    Time spent:  35 minutes    Barton Dubois  [facility] Pager [phone]. If 7PM-7AM, please contact night-coverage at [email or web address], password [facility] 04/11/2015, 10:37 AM  LOS: 4 days

## 2015-04-12 LAB — CBC
HCT: 22.9 % — ABNORMAL LOW (ref 36.0–46.0)
HEMOGLOBIN: 7.5 g/dL — AB (ref 12.0–15.0)
MCH: 30.9 pg (ref 26.0–34.0)
MCHC: 32.8 g/dL (ref 30.0–36.0)
MCV: 94.2 fL (ref 78.0–100.0)
Platelets: 337 10*3/uL (ref 150–400)
RBC: 2.43 MIL/uL — AB (ref 3.87–5.11)
RDW: 14.3 % (ref 11.5–15.5)
WBC: 10.1 10*3/uL (ref 4.0–10.5)

## 2015-04-12 LAB — BASIC METABOLIC PANEL
Anion gap: 7 (ref 5–15)
BUN: 10 mg/dL (ref 6–20)
CHLORIDE: 97 mmol/L — AB (ref 101–111)
CO2: 29 mmol/L (ref 22–32)
Calcium: 8.1 mg/dL — ABNORMAL LOW (ref 8.9–10.3)
Creatinine, Ser: 0.85 mg/dL (ref 0.44–1.00)
GFR calc Af Amer: >60 mL/min (ref >60)
GFR calc non Af Amer: >60 mL/min (ref >60)
GLUCOSE: 113 mg/dL — AB (ref 65–99)
POTASSIUM: 3.9 mmol/L (ref 3.5–5.1)
Sodium: 133 mmol/L — ABNORMAL LOW (ref 135–145)

## 2015-04-12 MED ORDER — WHITE PETROLATUM GEL
Status: AC
Start: 2015-04-12 — End: 2015-04-12
  Administered 2015-04-12: 21:00:00
  Filled 2015-04-12: qty 1

## 2015-04-12 NOTE — NC FL2 (Signed)
[place] LEVEL OF CARE SCREENING TOOL     IDENTIFICATION  Patient Name: Lisa Mcclure Birthdate: 01-20-1933 Sex: female Admission Date (Current Location): 04/07/2015  [facility] and [place] Number:  Herbalist and Address:  [facility], [place] [address], [place], [place] [address]      Provider Number: [id number]  Attending Physician Name and Address:  Barton Dubois, MD  Relative Name and Phone Number:       Current Level of Care: Hospital Recommended Level of Care: [place] Prior Approval Number:    Date Approved/Denied:   PASRR Number:    Discharge Plan: Domiciliary (Rest home)    Current Diagnoses: Patient Active Problem List   Diagnosis Date Noted  . Thyroid activity decreased   . Lower GI bleed 04/08/2015  . Hematochezia   . Rectal bleeding 04/03/2015  . Patellar fracture 03/06/2015  . Chronic diastolic heart failure, NYHA class 1 ([place]) 03/06/2015  . Left humeral fracture 11/04/2013  . Rib fracture 11/04/2013  . Hypothyroid 11/04/2013  . Hypertension 11/04/2013  . OSA (obstructive sleep apnea) 11/13/2008  . BOOP (bronchiolitis obliterans with organizing pneumonia) ([place]) 08/25/2008    Orientation RESPIRATION BLADDER Height & Weight    Self, Time, Situation, Place    Continent   210 lbs.  BEHAVIORAL SYMPTOMS/MOOD NEUROLOGICAL BOWEL NUTRITION STATUS      Continent    AMBULATORY STATUS COMMUNICATION OF NEEDS Skin   Limited Assist Verbally                         Personal Care Assistance Level of Assistance  Bathing, Feeding, Dressing Bathing Assistance: Limited assistance Feeding assistance: Limited assistance Dressing Assistance: Limited assistance     Functional Limitations Info             SPECIAL CARE FACTORS FREQUENCY                       Contractures      Additional Factors Info                  Current Medications (04/12/2015):  This is the  current hospital active medication list Current Facility-Administered Medications  Medication Dose Route Frequency Provider Last Rate Last Dose  . busPIRone (BUSPAR) tablet 7.5 mg  7.5 mg Oral TID Barton Dubois, MD   7.5 mg at 04/12/15 0947  . desmopressin (DDAVP) tablet 0.4 mg  0.4 mg Oral QHS Oswald Hillock, MD   0.4 mg at 04/11/15 2148  . furosemide (LASIX) tablet 20 mg  20 mg Oral Daily Barton Dubois, MD   20 mg at 04/12/15 0947  . HYDROcodone-acetaminophen (NORCO) 7.5-325 MG per tablet 1 tablet  1 tablet Oral Q4H PRN Oswald Hillock, MD   1 tablet at 04/12/15 0947  . hydrocortisone (ANUSOL-HC) 2.5 % rectal cream   Rectal BID Arta Silence, MD      . iron polysaccharides (NIFEREX) capsule 150 mg  150 mg Oral BID Barton Dubois, MD   150 mg at 04/12/15 0947  . levothyroxine (SYNTHROID, LEVOTHROID) tablet 100 mcg  100 mcg Oral QAC breakfast Gennaro Africa, MD   100 mcg at 04/12/15 [id number]  . losartan (COZAAR) tablet 100 mg  100 mg Oral Daily Barton Dubois, MD   100 mg at 04/12/15 0947  . methocarbamol (ROBAXIN) tablet 500 mg  500 mg Oral Q8H PRN Barton Dubois, MD   500 mg  at 04/12/15 0056  . metoprolol (LOPRESSOR) tablet 50 mg  50 mg Oral BID Barton Dubois, MD   50 mg at 04/12/15 0947  . morphine 2 MG/ML injection 2 mg  2 mg Intravenous Q4H PRN Gennaro Africa, MD   2 mg at 04/11/15 2150  . nortriptyline (PAMELOR) capsule 25 mg  25 mg Oral QHS Barton Dubois, MD   25 mg at 04/11/15 2148  . pantoprazole (PROTONIX) EC tablet 40 mg  40 mg Oral Q1200 Barton Dubois, MD   40 mg at 04/12/15 1232  . polyethylene glycol (MIRALAX / GLYCOLAX) packet 17 g  17 g Oral Daily Barton Dubois, MD   17 g at 04/12/15 [id number]     Discharge Medications: Please see discharge summary for a list of discharge medications.  Relevant Imaging Results:  Relevant Lab Results:   Additional Information    Moshe Cipro Berneice Heinrich, LCSW

## 2015-04-12 NOTE — Progress Notes (Signed)
[facility] PROGRESS NOTE  Shulonda Demichael Digeronimo [id number] DOB: Oct 23, 1932 DOA: 04/07/2015 PCP: [name], MD  Assessment/Plan: Rectal bleeding/hematochezia: subacute -VSS -patient bleeding appears to have resolved  -bleeding scan unrevealing -GI on board, status post flexible sigmoidoscopy which finding consistent with sigmoid diverticulosis and external hemorrhoid otherwise unremarkable. No active bleeding appreciated.  -Will continue treatment with Anusol BID for 7 days as recommended by GI -tolerating soft diet  -continue PO PPI  ABLA -patient Hgb on admission 7.0 -s/p 2 units of PRBC's -will continue niferex -follow Hgb trend; hemoglobin 7.5 (04/12/15) -will hold on further transfusion for now; hemodynamically stable and asymptomatic. -CBC in am  HTN BP is now elevated and rising  -Worsen home antihypertensive regimen (Cozaar and metoprolol) stable, but soft  Hypothyroidism -will continue synthroid  Chronic diastolic heart failure: compensated -will follow strict I's and O's -daily weight -Will continue Lasix at 20mg  daily -No signs of fluid overload currently.  OSA: refuse CPAP -have discussion/education about importance of use -will follow with PCP about this and discussed availability for nasal prong   Left severe OA and proximal humerus fracture:  -approx 5 weeks ago after mechanical fall -patient x-ray revealing abundant calluses and boney remodeling about left proximal humerus fracture; also demonstrated severe OA of glenohumeral joint  -per ortho ok to remove sling and initiate use of her extremity as tolerated -will ask PT to evaluate and provide rec's   Left patella fracture: -x-ray demonstrated fracture remains in good position -plan is for WBAT, but to continue knee immobilizer for 2 more weeks  -appreciate orthopedic evaluation, assistance and rec's -will follow PT evaluation and recommendation  Anxiety/depression/insomnia: -will  resume home meds  Code Status: Full Code Family Communication: Son at bedside  Disposition Plan: remains inpatient; bleeding scan unrevealing; will watch hemoglobin trend, transfuse as needed and advance diet. Hopefully home in the next 24 hours   Consultants:  GI (Dr. Paulita Fujita)  Orthopedic service (Dr. Onnie Graham)  Procedures:  Bleeding scan: No scintigraphic evidence of active GI bleeding.  Flexible sigmoidoscopy (12/30): Positive finding for sigmoid colon diverticulosis without active bleeding, but evidence of intramural edema and petechiae. Large external hemorrhoids. Otherwise normal exam   Antibiotics:  None   HPI/Subjective: Afebrile, denies CP, SOB, nausea, vomiting and abd pain. No further bleeding reported sine 12/30. No lightheadedness and complaining only of LLE pain/spasm.  Objective: Filed Vitals:   04/12/15 0458 04/12/15 1325  BP: 142/67 138/64  Pulse: 60 72  Temp: 98.6 F (37 C) 98.4 F (36.9 C)  Resp: 18 17    Intake/Output Summary (Last 24 hours) at 04/12/15 1446 Last data filed at 04/12/15 1325  Gross per 24 hour  Intake    664 ml  Output    600 ml  Net     64 ml   Filed Weights   04/07/15 2059 04/08/15 0130 04/10/15 0833  Weight: 93.895 kg (207 lb) 95.5 kg (210 lb 8.6 oz) 95.255 kg (210 lb)    Exam:   General:  Afebrile, denies CP and SOB. Patient is AAOX3, reports no further bleeding and denies lightheadedness.   Cardiovascular: S1 and S2, no rubs or gallops  Respiratory: CTA bilaterally  Abdomen: soft, NT, positive BS  Musculoskeletal: LLE with knee immobilizer in place; LUE without sling now and able to move it w/o to much difficulty.  Data Reviewed: Basic Metabolic Panel:  Recent Labs Lab 04/08/15 0544 04/09/15 0619 04/10/15 0603 04/11/15 2203 04/12/15 0349  NA 136 138 138 135 133*  K 4.1 4.0 3.7 4.2 3.9  CL 99* 103 101 98* 97*  CO2 29 28 27 30 29   GLUCOSE 112* 114* 115* 125* 113*  BUN 20 14 12 9 10   CREATININE 0.92 0.80  0.83 0.81 0.85  CALCIUM 8.3* 8.4* 8.5* 8.3* 8.1*   Liver Function Tests:  Recent Labs Lab 04/07/15 2216 04/08/15 0544  AST 17 14*  ALT 13* 12*  ALKPHOS 100 87  BILITOT 0.5 0.5  PROT 6.0* 5.3*  ALBUMIN 3.2* 2.9*    Recent Labs Lab 04/07/15 2216  LIPASE 16   CBC:  Recent Labs Lab 04/07/15 2216 04/08/15 0544  04/09/15 0619 04/10/15 0603 04/11/15 1445 04/11/15 2203 04/12/15 0349  WBC 11.9* 10.0  < > 9.9 11.2* 11.7* 11.1* 10.1  NEUTROABS 8.7* 6.9  --   --   --   --   --   --   HGB 7.8* 7.0*  < > 8.9* 8.6* 7.5* 8.0* 7.5*  HCT 24.0* 21.4*  < > 26.2* 26.6* 23.0* 24.3* 22.9*  MCV 93.4 92.6  < > 92.3 94.0 94.3 94.6 94.2  PLT 349 303  < > 320 360 357 382 337  < > = values in this interval not displayed.   Recent Results (from the past 240 hour(s))  MRSA PCR Screening     Status: None   Collection Time: 04/03/15  2:37 PM  Result Value Ref Range Status   MRSA by PCR NEGATIVE NEGATIVE Final    Comment:        The GeneXpert MRSA Assay (FDA approved for NASAL specimens only), is one component of a comprehensive MRSA colonization surveillance program. It is not intended to diagnose MRSA infection nor to guide or monitor treatment for MRSA infections.      Studies: No results found.  Scheduled Meds: . busPIRone  7.5 mg Oral TID  . desmopressin  0.4 mg Oral QHS  . furosemide  20 mg Oral Daily  . hydrocortisone   Rectal BID  . iron polysaccharides  150 mg Oral BID  . levothyroxine  100 mcg Oral QAC breakfast  . losartan  100 mg Oral Daily  . metoprolol  50 mg Oral BID  . nortriptyline  25 mg Oral QHS  . pantoprazole  40 mg Oral Q1200  . polyethylene glycol  17 g Oral Daily    Time spent: 35 minutes    Barton Dubois  [facility] Pager [phone]. If 7PM-7AM, please contact night-coverage at [email or web address], password [facility] 04/12/2015, 2:46 PM  LOS: 5 days

## 2015-04-12 NOTE — Progress Notes (Signed)
No bowel movements or bleeding.   Feels less lightheaded than yesterday.   However, hemoglobin seems to have reached a new, lower plateau, between 7.5 and 8.0. This is roughly 1 g lower than it had been running for the first couple of days posttransfusion. This could simply be delayed equilibration, or perhaps, the patient had a self-limited bleed and has not yet expelled the blood.  In view of this patient's age and the lack of clarity concerning her underlying cause for bleeding, plus the recent downward trend in her hemoglobin, I think it would be reasonable to observe her another day while on a soft diet. Discharge tomorrow would be reasonable if her hemoglobin remains stable and there is no evidence of further bleeding.  Cleotis Nipper, M.D. Pager [phone] If no answer or after 5 PM call [phone]

## 2015-04-13 DIAGNOSIS — S82002A Unspecified fracture of left patella, initial encounter for closed fracture: Secondary | ICD-10-CM | POA: Insufficient documentation

## 2015-04-13 DIAGNOSIS — K219 Gastro-esophageal reflux disease without esophagitis: Secondary | ICD-10-CM | POA: Insufficient documentation

## 2015-04-13 DIAGNOSIS — S42202D Unspecified fracture of upper end of left humerus, subsequent encounter for fracture with routine healing: Secondary | ICD-10-CM

## 2015-04-13 DIAGNOSIS — I10 Essential (primary) hypertension: Secondary | ICD-10-CM | POA: Insufficient documentation

## 2015-04-13 DIAGNOSIS — S42209A Unspecified fracture of upper end of unspecified humerus, initial encounter for closed fracture: Secondary | ICD-10-CM | POA: Insufficient documentation

## 2015-04-13 DIAGNOSIS — I5032 Chronic diastolic (congestive) heart failure: Secondary | ICD-10-CM | POA: Insufficient documentation

## 2015-04-13 LAB — CBC
HEMATOCRIT: 22.5 % — AB (ref 36.0–46.0)
HEMOGLOBIN: 7.4 g/dL — AB (ref 12.0–15.0)
MCH: 30.7 pg (ref 26.0–34.0)
MCHC: 32.9 g/dL (ref 30.0–36.0)
MCV: 93.4 fL (ref 78.0–100.0)
Platelets: 369 10*3/uL (ref 150–400)
RBC: 2.41 MIL/uL — ABNORMAL LOW (ref 3.87–5.11)
RDW: 13.7 % (ref 11.5–15.5)
WBC: 9.4 10*3/uL (ref 4.0–10.5)

## 2015-04-13 MED ORDER — HYDROCODONE-ACETAMINOPHEN 7.5-325 MG PO TABS: 1.0000 | ORAL_TABLET | Freq: Four times a day (QID) | ORAL | Status: DC | PRN

## 2015-04-13 MED ORDER — BUSPIRONE HCL 7.5 MG PO TABS: 7.5000 mg | ORAL_TABLET | Freq: Three times a day (TID) | ORAL | Status: DC

## 2015-04-13 MED ORDER — SENNA-DOCUSATE SODIUM 8.6-50 MG PO TABS: 1.0000 | ORAL_TABLET | Freq: Every day | ORAL | Status: DC

## 2015-04-13 MED ORDER — HYDROCORTISONE 2.5 % RE CREA
TOPICAL_CREAM | Freq: Two times a day (BID) | RECTAL | Status: DC
Start: 2015-04-13 — End: 2015-04-21

## 2015-04-13 MED ORDER — POLYSACCHARIDE IRON COMPLEX 150 MG PO CAPS: 150.0000 mg | ORAL_CAPSULE | Freq: Two times a day (BID) | ORAL | Status: DC

## 2015-04-13 MED ORDER — FUROSEMIDE 20 MG PO TABS
20.0000 mg | ORAL_TABLET | Freq: Every day | ORAL | Status: DC
Start: 2015-04-13 — End: 2015-07-08

## 2015-04-13 MED ORDER — FUROSEMIDE 20 MG PO TABS
20.0000 mg | ORAL_TABLET | Freq: Every day | ORAL | Status: DC
Start: 2015-04-13 — End: 2015-04-13

## 2015-04-13 MED ORDER — HYDROCORTISONE 2.5 % RE CREA
TOPICAL_CREAM | Freq: Two times a day (BID) | RECTAL | Status: DC
Start: 2015-04-13 — End: 2015-04-13

## 2015-04-13 MED ORDER — METHOCARBAMOL 500 MG PO TABS: 500.0000 mg | ORAL_TABLET | Freq: Three times a day (TID) | ORAL | Status: DC | PRN

## 2015-04-13 NOTE — Progress Notes (Signed)
No bleeding whatsoever.  Hgb stable.  Agree w/ plan for dischg.  Pt states she plans to make f/u appt w/ Dr. Paulita Fujita to discuss possible full colonoscopy.  Cleotis Nipper, M.D. Pager [phone] If no answer or after 5 PM call [phone]

## 2015-04-13 NOTE — Progress Notes (Signed)
Pt discharged to [facility] and transported by [facility].  Discharge instructions explained to pt and a copy in pt's chart.  IV removed.  Belongings sent with pt.

## 2015-04-13 NOTE — Care Management Important Message (Signed)
Important Message  Patient Details  Name: Lyniah Valazquez MRN: [id number] Date of Birth: Jan 13, 1933   Medicare Important Message Given:  Yes    Louanne Belton 04/13/2015, 1:10 PMImportant Message  Patient Details  Name: Tosha Cannaday MRN: [id number] Date of Birth: 1932/09/07   Medicare Important Message Given:  Yes    Aubriel Khanna G 04/13/2015, 1:10 PM

## 2015-04-13 NOTE — Discharge Summary (Signed)
Physician Discharge Summary  Lisa Mcclure [id number] DOB: 1932/05/17 DOA: 04/07/2015  PCP: [name], MD  Admit date: 04/07/2015 Discharge date: 04/13/2015  Time spent: 35 minutes  Recommendations for Outpatient Follow-up:  1. Repeat CBC to follow Hgb trend 2. Please reassess volume and adjust diuretics as needed 3. BMET to follow electrolytes and renal function 4. Follow up with orthopedic service as instructed    Discharge Diagnoses:  Active Problems:   Rectal bleeding   Lower GI bleed   Hematochezia   Thyroid activity decreased   Proximal humerus fracture   Left patella fracture   Benign essential HTN   Esophageal reflux   Chronic diastolic heart failure ([place])   Discharge Condition: stable and improved. No active bleeding or symptoms of anemia at discharge. Will go to ALF for further care. Follow up with PCP in 1 week. Follow up with GI service in 4-6 weeks.  Diet recommendation: heart healthy/low sodium diet; needs soft low residue diet   Filed Weights   04/07/15 2059 04/08/15 0130 04/10/15 0833  Weight: 93.895 kg (207 lb) 95.5 kg (210 lb 8.6 oz) 95.255 kg (210 lb)    History of present illness:  82 yo female with history of HTN, OSA, hypothyroidism, diastolic heart failure who was recently admitted for a lower GI bleeding. Patient was discharged 3 days ago. She said she continued to have bloody stool that is red with no tarry stool seen. No hematemesis. She lost her appetite, but was able to tolerate food. She has no chest pain, dizziness, fatigue, dyspnea, N/V  Or any other complaints.   Hospital Course:  Rectal bleeding/hematochezia: subacute -VSS -patient bleeding appears to have finally resolved  -bleeding scan unrevealing -GI was on board, status post flexible sigmoidoscopy, which findings demonstrated sigmoid diverticulosis and large external hemorrhoid otherwise unremarkable. No active bleeding appreciated during study.  -Patient will  continue treatment with Anusol BID for 7 days as recommended by GI -soft low residue diet  -continue PO PPI and outpatient follow up with GI  ABLA -patient Hgb on admission 7.0 -s/p 2 units of PRBC's -will continue niferex BID -follow Hgb trend as an outpatient -Hgb 7.5-8.0 at discharge  -will hold on further transfusion for now; hemodynamically stable and asymptomatic.  HTN -stable at discharge -advise to follow low sodium diet and to continue home antihypertensive regimen   Hypothyroidism -will continue synthroid 100 Mcg daily  Chronic diastolic heart failure: compensated -Advise to follow low sodium diet  And to check her weight on daily basis  -Will continue Lasix at 20mg  daily -No signs of fluid overload currently.  OSA: refuse CPAP -have discussion/education about importance of use -will follow with PCP about this and discussed availability for nasal prong   Left severe OA and proximal humerus fracture:  -approx 5 weeks ago after mechanical fall -patient x-ray revealing abundant calluses and boney remodeling about left proximal humerus fracture; also demonstrated severe OA of glenohumeral joint  -per ortho ok to remove sling and initiate use of her extremity as tolerated  Left patella fracture: -x-ray demonstrated fracture remains in good position -plan is for WBAT, but to continue knee immobilizer for another 2 weeks  -appreciate orthopedic evaluation, assistance and rec's -will follow up with Dr. Onnie Graham in approx 2 weeks after discharge  Anxiety/depression/insomnia: -will continue home meds -stable -no hallucinations or SI  Procedures:  Bleeding scan: No scintigraphic evidence of active GI bleeding.  Flexible sigmoidoscopy (12/30): Positive finding for sigmoid colon diverticulosis without active bleeding, but  evidence of intramural edema and petechiae. Large external hemorrhoids. Otherwise normal exam  Consultations:  Orthopedic service  GI service    Discharge Exam: Filed Vitals:   04/13/15 0652 04/13/15 1014  BP: 141/66 147/57  Pulse: 60 69  Temp: 98.2 F (36.8 C)   Resp: 17     General: Afebrile, No SOB, no CP. There has not been further bleeding and patient denies lightheadedness or any complaints.   Cardiovascular: S1 and S2, no rubs or gallops  Respiratory: CTA bilaterally  Abdomen: soft, NT, positive BS  Musculoskeletal: LLE with knee immobilizer in place; LUE without sling now and able to move it w/o to much difficulty.  Discharge Instructions   Discharge Instructions    Discharge instructions    Complete by:  As directed   Maintain adequate hydrationtake medications as prescribed Follow low residue soft diet Please arrange follow up with PCP in 1 week CBC to be check during follow up visit to follow Hgb trend Follow up with orthopedic service (Dr. Onnie Graham) as previously instructed Follow up with GI ([facility]) in approx 4-6 weeks (call office for appointment details) Avoid the use of NSAID's  Tylenol ok to be taken as needed for pain breakthrough (no more than 3.5 G daily) No knee bending on your left knee; ok to bear weight as tolerated          Current Discharge Medication List    START taking these medications   Details  busPIRone (BUSPAR) 7.5 MG tablet Take 1 tablet (7.5 mg total) by mouth 3 (three) times daily. Qty: 20 tablet, Refills: 0    furosemide (LASIX) 20 MG tablet Take 1 tablet (20 mg total) by mouth daily. Qty: 30 tablet    hydrocortisone (ANUSOL-HC) 2.5 % rectal cream Place rectally 2 (two) times daily. Qty: 30 g, Refills: 0    iron polysaccharides (NIFEREX) 150 MG capsule Take 1 capsule (150 mg total) by mouth 2 (two) times daily. Qty: 60 capsule, Refills: 1    methocarbamol (ROBAXIN) 500 MG tablet Take 1 tablet (500 mg total) by mouth every 8 (eight) hours as needed for muscle spasms. Qty: 30 tablet, Refills: 0      CONTINUE these medications which have CHANGED   Details   HYDROcodone-acetaminophen (NORCO) 7.5-325 MG tablet Take 1 tablet by mouth every 6 (six) hours as needed for moderate pain. Qty: 20 tablet, Refills: 0    sennosides-docusate sodium (SENOKOT-S) 8.6-50 MG tablet Take 1 tablet by mouth daily.      CONTINUE these medications which have NOT CHANGED   Details  benzonatate (TESSALON PERLES) 100 MG capsule Take 100 mg by mouth 3 (three) times daily as needed for cough.    cholecalciferol (VITAMIN D) 1000 UNITS tablet Take 1,000 Units by mouth daily.    hydrALAZINE (APRESOLINE) 10 MG tablet Take 10 mg by mouth 2 (two) times daily. HOLD for SBP <110    levothyroxine (SYNTHROID, LEVOTHROID) 100 MCG tablet Take 100 mcg by mouth daily before breakfast.    losartan (COZAAR) 100 MG tablet Take 100 mg by mouth daily.    Melatonin 5 MG TABS Take 5 mg by mouth at bedtime.     metoprolol (LOPRESSOR) 50 MG tablet Take 50 mg by mouth 2 (two) times daily.    Multiple Vitamin (MULTI-DAY VITAMINS PO) Take 1 tablet by mouth daily.     mupirocin ointment (BACTROBAN) 2 % Place 1 application into the nose daily.    nortriptyline (PAMELOR) 25 MG capsule Take 25  mg by mouth at bedtime.    omeprazole (PRILOSEC) 20 MG capsule Take 20 mg by mouth 2 (two) times daily before a meal.     polyethylene glycol (MIRALAX / GLYCOLAX) packet Take 17 g by mouth daily.     potassium chloride SA (K-DUR,KLOR-CON) 20 MEQ tablet Take 1 tablet (20 mEq total) by mouth daily. Qty: 90 tablet, Refills: 3   Associated Diagnoses: Essential hypertension; Congestive heart failure, unspecified congestive heart failure chronicity, unspecified congestive heart failure type (HCC)      STOP taking these medications     meloxicam (MOBIC) 15 MG tablet      aspirin 81 MG chewable tablet        No Known Allergies Follow-up Information    Follow up with [name], MD In 1 week.   Specialty:  Internal Medicine   Contact information:   [place]  [address] [phone]       Follow up with Landry Dyke, MD.   Specialty:  Gastroenterology   Why:  in 4-6 weeks; call office for appointment details   Contact information:   [address] [place] [address] [phone]       The results of significant diagnostics from this hospitalization (including imaging, microbiology, ancillary and laboratory) are listed below for reference.    Significant Diagnostic Studies: Dg Knee 1-2 Views Left  2015/04/18  CLINICAL DATA:  Evaluate known patellar fracture from fall 5 weeks ago EXAM: LEFT KNEE - 1-2 VIEW COMPARISON:  03/06/2015 FINDINGS: Changes of medial compartment hemiarthroplasty, stable. Lucency through the lower pole of the left patella compatible with fracture. There has been bony resorption along the fracture line with fracture line remaining evident. No visible callus formation currently. IMPRESSION: Bony resorption along the left patellar fracture with fracture line remaining evident. No visible callus formation. Electronically Signed   By: Rolm Baptise M.D.   On: Apr 18, 2015 19:26   Nm Gi Blood Loss  04/09/2015  CLINICAL DATA:  Hematochezia. Inpatient. Red blood in stool 1 week prior. EXAM: NUCLEAR MEDICINE GASTROINTESTINAL BLEEDING SCAN TECHNIQUE: Sequential abdominal images were obtained following intravenous administration of Tc-66m labeled red blood cells. RADIOPHARMACEUTICALS:  22.9 mCi Tc-9m in-vitro labeled red cells. COMPARISON:  None. FINDINGS: There is no abnormal tagged red blood cell activity within the abdomen or pelvis to suggest active GI bleeding. IMPRESSION: No scintigraphic evidence of active GI bleeding. Electronically Signed   By: Ilona Sorrel M.D.   On: 04/09/2015 13:57   Dg Shoulder Left  04-18-2015  CLINICAL DATA:  Proximal humeral fracture 6 weeks ago.  Pain. EXAM: LEFT SHOULDER - 2+ VIEW COMPARISON:  03/06/2015.  CT 03/07/2015. FINDINGS: Healing humeral neck fracture noted with callus  formation and sclerosis about the fracture line. The fracture line remains partially evident. Advanced degenerative changes within the left shorter. IMPRESSION: Evidence of healing left humeral neck fracture. Fracture line remains evident. Advanced degenerative changes. Electronically Signed   By: Rolm Baptise M.D.   On: 04-18-2015 19:25    Microbiology: Recent Results (from the past 240 hour(s))  MRSA PCR Screening     Status: None   Collection Time: 04/03/15  2:37 PM  Result Value Ref Range Status   MRSA by PCR NEGATIVE NEGATIVE Final    Comment:        The GeneXpert MRSA Assay (FDA approved for NASAL specimens only), is one component of a comprehensive MRSA colonization surveillance program. It is not intended to diagnose MRSA infection nor  to guide or monitor treatment for MRSA infections.      Labs: Basic Metabolic Panel:  Recent Labs Lab 04/08/15 0544 04/09/15 0619 04/10/15 0603 04/11/15 2203 04/12/15 0349  NA 136 138 138 135 133*  K 4.1 4.0 3.7 4.2 3.9  CL 99* 103 101 98* 97*  CO2 29 28 27 30 29   GLUCOSE 112* 114* 115* 125* 113*  BUN 20 14 12 9 10   CREATININE 0.92 0.80 0.83 0.81 0.85  CALCIUM 8.3* 8.4* 8.5* 8.3* 8.1*   Liver Function Tests:  Recent Labs Lab 04/07/15 2216 04/08/15 0544  AST 17 14*  ALT 13* 12*  ALKPHOS 100 87  BILITOT 0.5 0.5  PROT 6.0* 5.3*  ALBUMIN 3.2* 2.9*    Recent Labs Lab 04/07/15 2216  LIPASE 16   CBC:  Recent Labs Lab 04/07/15 2216 04/08/15 0544  04/10/15 0603 04/11/15 1445 04/11/15 2203 04/12/15 0349 04/13/15 0422  WBC 11.9* 10.0  < > 11.2* 11.7* 11.1* 10.1 9.4  NEUTROABS 8.7* 6.9  --   --   --   --   --   --   HGB 7.8* 7.0*  < > 8.6* 7.5* 8.0* 7.5* 7.4*  HCT 24.0* 21.4*  < > 26.6* 23.0* 24.3* 22.9* 22.5*  MCV 93.4 92.6  < > 94.0 94.3 94.6 94.2 93.4  PLT 349 303  < > 360 357 382 337 369  < > = values in this interval not displayed.   Signed:  Barton Dubois MD   [facility] 04/13/2015, 1:08  PM

## 2015-04-13 NOTE — Care Management Note (Signed)
Case Management Note  Patient Details  Name: Lisa Mcclure MRN: [id number] Date of Birth: [birth year]  Subjective/Objective:                    Action/Plan:   Expected Discharge Date:                  Expected Discharge Plan:  [place]  In-House Referral:     Discharge planning Services  CM Consult  Post Acute Care Choice:  Home Health, Durable Medical Equipment Choice offered to:     DME Arranged:  Wheelchair manual DME Agency:  [place]:  PT, OT [facility] Agency:  [place]  Status of Service:  Completed, signed off  Medicare Important Message Given:    Date Medicare IM Given:    Medicare IM give by:    Date Additional Medicare IM Given:    Additional Medicare Important Message give by:     If discussed at [place] of Stay Meetings, dates discussed:    Additional Comments:  Marilu Favre, RN 04/13/2015, 9:09 AM

## 2015-04-13 NOTE — Clinical Social Work Note (Signed)
CSW received consult that patient needs ambulance transport in order to get back home to [facility].  CSW contacted [facility] and scheduled transportation.  CSW to sign off, please reconsult if other social work needs arise.  Jones Broom. [place], MSW, [place] 04/13/2015 3:51 PM

## 2015-04-13 NOTE — Clinical Social Work Note (Addendum)
12:20 pm CSW attempted to call [facility] twice today at [phone], and left messages each time about patient returning to facility.  CSW still awaiting call back from ALF.  4:50pm CSW contacted [facility], ALF requested FL2 and discharge summary to be faxed to ALF.  CSW to fax requested information CSW received confirmation that they did receive discharge summary and FL2  Jones Broom. [place], MSW, [place] 04/13/2015 12:26 PM

## 2015-04-14 ENCOUNTER — Encounter (HOSPITAL_COMMUNITY): Payer: Self-pay | Admitting: Gastroenterology

## 2015-04-16 ENCOUNTER — Inpatient Hospital Stay (HOSPITAL_COMMUNITY)
Admission: EM | Admit: 2015-04-16 | Discharge: 2015-04-21 | DRG: 194 | Disposition: A | Discharge status: Skilled Nursing Facility | Payer: Medicare HMO | Attending: Internal Medicine | Admitting: Internal Medicine

## 2015-04-16 ENCOUNTER — Encounter (HOSPITAL_COMMUNITY): Payer: Self-pay | Admitting: *Deleted

## 2015-04-16 ENCOUNTER — Emergency Department (HOSPITAL_COMMUNITY): Payer: Medicare HMO

## 2015-04-16 DIAGNOSIS — J101 Influenza due to other identified influenza virus with other respiratory manifestations: Secondary | ICD-10-CM | POA: Diagnosis present

## 2015-04-16 DIAGNOSIS — Z87891 Personal history of nicotine dependence: Secondary | ICD-10-CM

## 2015-04-16 DIAGNOSIS — S42302A Unspecified fracture of shaft of humerus, left arm, initial encounter for closed fracture: Secondary | ICD-10-CM | POA: Diagnosis present

## 2015-04-16 DIAGNOSIS — M069 Rheumatoid arthritis, unspecified: Secondary | ICD-10-CM | POA: Diagnosis present

## 2015-04-16 DIAGNOSIS — Y95 Nosocomial condition: Secondary | ICD-10-CM | POA: Diagnosis present

## 2015-04-16 DIAGNOSIS — R05 Cough: Secondary | ICD-10-CM | POA: Diagnosis not present

## 2015-04-16 DIAGNOSIS — Z9181 History of falling: Secondary | ICD-10-CM

## 2015-04-16 DIAGNOSIS — I5032 Chronic diastolic (congestive) heart failure: Secondary | ICD-10-CM | POA: Diagnosis present

## 2015-04-16 DIAGNOSIS — K922 Gastrointestinal hemorrhage, unspecified: Secondary | ICD-10-CM | POA: Diagnosis not present

## 2015-04-16 DIAGNOSIS — J09X1 Influenza due to identified novel influenza A virus with pneumonia: Secondary | ICD-10-CM | POA: Diagnosis not present

## 2015-04-16 DIAGNOSIS — I10 Essential (primary) hypertension: Secondary | ICD-10-CM | POA: Diagnosis present

## 2015-04-16 DIAGNOSIS — R059 Cough, unspecified: Secondary | ICD-10-CM

## 2015-04-16 DIAGNOSIS — G4733 Obstructive sleep apnea (adult) (pediatric): Secondary | ICD-10-CM | POA: Diagnosis present

## 2015-04-16 DIAGNOSIS — M25512 Pain in left shoulder: Secondary | ICD-10-CM | POA: Diagnosis not present

## 2015-04-16 DIAGNOSIS — K5900 Constipation, unspecified: Secondary | ICD-10-CM | POA: Diagnosis present

## 2015-04-16 DIAGNOSIS — F410 Panic disorder [episodic paroxysmal anxiety] without agoraphobia: Secondary | ICD-10-CM | POA: Diagnosis present

## 2015-04-16 DIAGNOSIS — E039 Hypothyroidism, unspecified: Secondary | ICD-10-CM | POA: Diagnosis present

## 2015-04-16 DIAGNOSIS — J189 Pneumonia, unspecified organism: Secondary | ICD-10-CM

## 2015-04-16 DIAGNOSIS — N3944 Nocturnal enuresis: Secondary | ICD-10-CM | POA: Diagnosis present

## 2015-04-16 DIAGNOSIS — K5790 Diverticulosis of intestine, part unspecified, without perforation or abscess without bleeding: Secondary | ICD-10-CM | POA: Diagnosis present

## 2015-04-16 DIAGNOSIS — E875 Hyperkalemia: Secondary | ICD-10-CM | POA: Diagnosis present

## 2015-04-16 DIAGNOSIS — G4700 Insomnia, unspecified: Secondary | ICD-10-CM | POA: Diagnosis present

## 2015-04-16 DIAGNOSIS — D649 Anemia, unspecified: Secondary | ICD-10-CM | POA: Diagnosis present

## 2015-04-16 DIAGNOSIS — K219 Gastro-esophageal reflux disease without esophagitis: Secondary | ICD-10-CM | POA: Diagnosis present

## 2015-04-16 DIAGNOSIS — E871 Hypo-osmolality and hyponatremia: Secondary | ICD-10-CM | POA: Diagnosis present

## 2015-04-16 DIAGNOSIS — Z7982 Long term (current) use of aspirin: Secondary | ICD-10-CM

## 2015-04-16 LAB — COMPREHENSIVE METABOLIC PANEL
ALBUMIN: 3.1 g/dL — AB (ref 3.5–5.0)
ALT: 11 U/L — ABNORMAL LOW (ref 14–54)
AST: 15 U/L (ref 15–41)
Alkaline Phosphatase: 94 U/L (ref 38–126)
Anion gap: 9 (ref 5–15)
BUN: 7 mg/dL (ref 6–20)
CALCIUM: 8.8 mg/dL — AB (ref 8.9–10.3)
CO2: 29 mmol/L (ref 22–32)
CREATININE: 0.86 mg/dL (ref 0.44–1.00)
Chloride: 101 mmol/L (ref 101–111)
GFR calc non Af Amer: >60 mL/min (ref >60)
Glucose, Bld: 111 mg/dL — ABNORMAL HIGH (ref 65–99)
Potassium: 5.3 mmol/L — ABNORMAL HIGH (ref 3.5–5.1)
SODIUM: 139 mmol/L (ref 135–145)
Total Bilirubin: 0.3 mg/dL (ref 0.3–1.2)
Total Protein: 6.9 g/dL (ref 6.5–8.1)

## 2015-04-16 LAB — CBC
HCT: 26 % — ABNORMAL LOW (ref 36.0–46.0)
Hemoglobin: 8.3 g/dL — ABNORMAL LOW (ref 12.0–15.0)
MCH: 30 pg (ref 26.0–34.0)
MCHC: 31.9 g/dL (ref 30.0–36.0)
MCV: 93.9 fL (ref 78.0–100.0)
PLATELETS: 505 10*3/uL — AB (ref 150–400)
RBC: 2.77 MIL/uL — AB (ref 3.87–5.11)
RDW: 13.9 % (ref 11.5–15.5)
WBC: 9.3 10*3/uL (ref 4.0–10.5)

## 2015-04-16 LAB — POC OCCULT BLOOD, ED: FECAL OCCULT BLD: NEGATIVE

## 2015-04-16 LAB — TYPE AND SCREEN
ABO/RH(D): B POS
Antibody Screen: NEGATIVE

## 2015-04-16 MED ORDER — VANCOMYCIN HCL 10 G IV SOLR
1250.0000 mg | Freq: Once | INTRAVENOUS | Status: AC
  Administered 2015-04-17: 1250 mg via INTRAVENOUS
  Filled 2015-04-16 (×2): qty 1250

## 2015-04-16 MED ORDER — DEXTROSE 5 % IV SOLN
1.0000 g | Freq: Three times a day (TID) | INTRAVENOUS | Status: DC
  Administered 2015-04-17 – 2015-04-18 (×4): 1 g via INTRAVENOUS
  Filled 2015-04-16 (×6): qty 1

## 2015-04-16 MED ORDER — DEXTROSE 5 % IV SOLN
1.0000 g | Freq: Once | INTRAVENOUS | Status: AC
  Administered 2015-04-16: 1 g via INTRAVENOUS
  Filled 2015-04-16: qty 1

## 2015-04-16 MED ORDER — VANCOMYCIN HCL IN DEXTROSE 750-5 MG/150ML-% IV SOLN
750.0000 mg | Freq: Two times a day (BID) | INTRAVENOUS | Status: DC
  Administered 2015-04-17 – 2015-04-18 (×3): 750 mg via INTRAVENOUS
  Filled 2015-04-16 (×4): qty 150

## 2015-04-16 NOTE — ED Provider Notes (Signed)
CSN: [id number]     Arrival date & time 04/16/15  1804 History   First MD Initiated Contact with Patient 04/16/15 2048     Chief Complaint  Patient presents with  . GI Bleeding     (Consider location/radiation/quality/duration/timing/severity/associated sxs/prior Treatment) HPI Comments: 82yo F w/ PMH including rheumatoid arthritis, OSA, diverticulosis, GERD, recent GI bleed who presents with dark stools. The patient was admitted here on 12/29 for bloody stools. She had a sigmoidoscopy and blood transfusion during hospital stay and was discharged 3 days ago after no obvious source of bleeding. She states that the following day she began having black bowel movements and has had 1 or 2 each day including today. She presented to the hospital last time for bright red blood but denies any of that since discharge. She began having a cough last night which is only mildly productive. She began having chills this afternoon. She states that she may have mild shortness of breath. She denies any vomiting or abdominal pain.  The history is provided by the patient.    Past Medical History  Diagnosis Date  . Rheumatoid arthritis(714.0)   . Unspecified essential hypertension   . Esophageal reflux   . Hypothyroidism   . Diverticulosis   . Insomnia   . OSA (obstructive sleep apnea)   . OAB (overactive bladder)   . LBP (low back pain)   . DOE (dyspnea on exertion)   . Shoulder pain, bilateral   . GERD (gastroesophageal reflux disease)   . OAB (overactive bladder)   . Depression   . Venous insufficiency   . Cardiomegaly   . Osteoarthritis    Past Surgical History  Procedure Laterality Date  . Appendectomy  1953  . Vesicovaginal fistula closure w/ tah    . Breast biopsy Right 1980  . Knee arthroscopy w/ autogenous cartilage implantation (aci) procedure Left 1994, 1995  . Knee arthroscopy Right 1996, 2010  . Shoulder surgery  1990  . Cystocele repair    . Cesarean section      1957, 1961, 1964   . Wrist surgery  1967  . Total abdominal hysterectomy  1972  . Bladder abduction-1996  1996  . Cosmetic surgery  1996  . Flexible sigmoidoscopy N/A 04/10/2015    Procedure: FLEXIBLE SIGMOIDOSCOPY;  Surgeon: Arta Silence, MD;  Location: [facility] ENDOSCOPY;  Service: Endoscopy;  Laterality: N/A;   Family History  Problem Relation Age of Onset  . COPD Father   . Lung cancer Mother   . Breast cancer Maternal Aunt    Social History  Substance Use Topics  . Smoking status: Former Smoker -- 10 years    Types: Cigarettes    Quit date: 04/11/1984  . Smokeless tobacco: None  . Alcohol Use: 0.0 oz/week    0 Standard drinks or equivalent per week     Comment: cocktail daily   OB History    No data available     Review of Systems 10 Systems reviewed and are negative for acute change except as noted in the HPI.    Allergies  Review of patient's allergies indicates no known allergies.  Home Medications   Prior to Admission medications   Medication Sig Start Date End Date Taking? Authorizing Provider  acetaminophen (TYLENOL) 325 MG tablet Take 650 mg by mouth every 6 (six) hours as needed for mild pain.   Yes Historical Provider, MD  aspirin 81 MG tablet Take 81 mg by mouth daily.   Yes Historical Provider, MD  benzonatate (TESSALON PERLES) 100 MG capsule Take 100 mg by mouth 3 (three) times daily as needed for cough.   Yes Historical Provider, MD  busPIRone (BUSPAR) 7.5 MG tablet Take 1 tablet (7.5 mg total) by mouth 3 (three) times daily. 04/13/15  Yes Barton Dubois, MD  cholecalciferol (VITAMIN D) 1000 UNITS tablet Take 1,000 Units by mouth daily.   Yes Historical Provider, MD  desmopressin (DDAVP) 0.2 MG tablet Take 0.4 mg by mouth at bedtime. 02/13/15  Yes Historical Provider, MD  furosemide (LASIX) 20 MG tablet Take 1 tablet (20 mg total) by mouth daily. 04/13/15  Yes Barton Dubois, MD  hydrALAZINE (APRESOLINE) 10 MG tablet Take 10 mg by mouth 2 (two) times daily. HOLD for SBP <110   Yes  Historical Provider, MD  HYDROcodone-acetaminophen (NORCO) 7.5-325 MG tablet Take 1 tablet by mouth every 6 (six) hours as needed for moderate pain. 04/13/15  Yes Barton Dubois, MD  hydrocortisone (ANUSOL-HC) 2.5 % rectal cream Place rectally 2 (two) times daily. 04/13/15  Yes Barton Dubois, MD  iron polysaccharides (NIFEREX) 150 MG capsule Take 1 capsule (150 mg total) by mouth 2 (two) times daily. 04/13/15  Yes Barton Dubois, MD  levothyroxine (SYNTHROID, LEVOTHROID) 100 MCG tablet Take 100 mcg by mouth daily before breakfast.   Yes Historical Provider, MD  losartan (COZAAR) 100 MG tablet Take 100 mg by mouth daily.   Yes Historical Provider, MD  Melatonin 5 MG TABS Take 5 mg by mouth at bedtime.    Yes Historical Provider, MD  methocarbamol (ROBAXIN) 500 MG tablet Take 1 tablet (500 mg total) by mouth every 8 (eight) hours as needed for muscle spasms. 04/13/15  Yes Barton Dubois, MD  metoprolol (LOPRESSOR) 50 MG tablet Take 50 mg by mouth 2 (two) times daily.   Yes Historical Provider, MD  Multiple Vitamin (MULTI-DAY VITAMINS PO) Take 1 tablet by mouth daily.    Yes Historical Provider, MD  mupirocin ointment (BACTROBAN) 2 % Place 1 application into the nose daily.   Yes Historical Provider, MD  nortriptyline (PAMELOR) 25 MG capsule Take 25 mg by mouth at bedtime.   Yes Historical Provider, MD  omeprazole (PRILOSEC) 20 MG capsule Take 20 mg by mouth 2 (two) times daily before a meal.    Yes Historical Provider, MD  oxymetazoline (AFRIN) 0.05 % nasal spray Place 1 spray into both nostrils 2 (two) times daily as needed for congestion.   Yes Historical Provider, MD  polyethylene glycol (MIRALAX / GLYCOLAX) packet Take 17 g by mouth daily.    Yes Historical Provider, MD  potassium chloride SA (K-DUR,KLOR-CON) 20 MEQ tablet Take 1 tablet (20 mEq total) by mouth daily. 02/19/15  Yes Dorothy Spark, MD  sennosides-docusate sodium (SENOKOT-S) 8.6-50 MG tablet Take 1 tablet by mouth daily. 04/13/15  Yes Barton Dubois, MD   BP 156/78 mmHg  Pulse 98  Temp(Src) 99.8 F (37.7 C) (Oral)  Resp 20  SpO2 95% Physical Exam  Constitutional: She is oriented to person, place, and time. She appears well-developed and well-nourished. No distress.  HENT:  Head: Normocephalic and atraumatic.  Mouth/Throat: Oropharynx is clear and moist.  Moist mucous membranes  Eyes: Conjunctivae are normal. Pupils are equal, round, and reactive to light.  Neck: Neck supple.  Cardiovascular: Normal rate, regular rhythm, normal heart sounds and intact distal pulses.   No murmur heard. Pulmonary/Chest: Effort normal and breath sounds normal. No respiratory distress.  Occasional deep cough  Abdominal: Soft. Bowel sounds are normal. She  exhibits no distension. There is no tenderness.  Genitourinary:  Brown stool normal rectal tone, Hemoccult negative  Musculoskeletal:  Left leg in a mobilizer with pitting edema of foot and ankle  Neurological: She is alert and oriented to person, place, and time.  Fluent speech  Skin: Skin is warm and dry.  Psychiatric: She has a normal mood and affect. Judgment normal.  Nursing note and vitals reviewed.    Chaperone was present during exam.  ED Course  Procedures (including critical care time) Labs Review Labs Reviewed  COMPREHENSIVE METABOLIC PANEL - Abnormal; Notable for the following:    Potassium 5.3 (*)    Glucose, Bld 111 (*)    Calcium 8.8 (*)    Albumin 3.1 (*)    ALT 11 (*)    All other components within normal limits  CBC - Abnormal; Notable for the following:    RBC 2.77 (*)    Hemoglobin 8.3 (*)    HCT 26.0 (*)    Platelets 505 (*)    All other components within normal limits  URINE CULTURE  OCCULT BLOOD X 1 CARD TO LAB, STOOL  URINALYSIS, ROUTINE W REFLEX MICROSCOPIC (NOT AT [facility])  POC OCCULT BLOOD, ED  TYPE AND SCREEN    Imaging Review Dg Chest 2 View  04/16/2015  CLINICAL DATA:  Cough and fever.  Recent fall. EXAM: CHEST  2 VIEW COMPARISON:  CT  03/09/2015 FINDINGS: The heart is enlarged. There is tortuosity of the thoracic aorta. Mild bibasilar atelectasis or scarring, unchanged. No confluent airspace disease, pleural effusion or pneumothorax. Severe degenerative change in both shoulders. IMPRESSION: Stable cardiomegaly.  Unchanged bibasilar atelectasis or scarring. Electronically Signed   By: Jeb Levering M.D.   On: 04/16/2015 21:55   I have personally reviewed and evaluated these lab results as part of my medical decision-making.   EKG Interpretation None     Medications  vancomycin (VANCOCIN) 1,250 mg in sodium chloride 0.9 % 250 mL IVPB (not administered)  vancomycin (VANCOCIN) IVPB 750 mg/150 ml premix (not administered)  cefTAZidime (FORTAZ) 1 g in dextrose 5 % 50 mL IVPB (not administered)  cefTAZidime (FORTAZ) 1 g in dextrose 5 % 50 mL IVPB (1 g Intravenous New Bag/Given 04/16/15 2313)    MDM   Final diagnoses:  Hospital-acquired pneumonia  cough Dark stools fever  Patient with recent admission for GI bleed presents with several days of dark stools as well as cough that began last night. On exam, the patient was well-appearing. O2 sat and respiratory rate normal. She was 100.9 on arrival. No abnormal lung sounds although intermittent cough. No abdominal tenderness. Brown stool which was Hemoccult negative. Obtained above lab work which showed potassium 5.3, hemoglobin 8.3 which is slightly higher than most recent. Chest x-ray negative for acute process. Of note, the patient began taking iron supplementation and it is possible that her change in stool color is related to the iron.  On reexamination, the patient continues to have significant cough and O2 saturation is 94% on room air. Although she has a negative chest x-ray, given her symptoms, mild hypoxia, and fever, I suspect hospital-acquired pneumonia. I have given vancomycin and Ceftazidime. Discussed with [facility] hospitalist, Dr. Loleta Books, and patient admitted for  further care.     Sharlett Iles, MD 04/17/15 0000

## 2015-04-16 NOTE — ED Notes (Signed)
Pt arrives via EMS from home c/o dark stools. Recently here for a fall and required a blood transfusion. Pt taking iron.

## 2015-04-16 NOTE — Progress Notes (Signed)
ANTIBIOTIC CONSULT NOTE - INITIAL  Pharmacy Consult for Vancomycin/Ceftazidime  Indication: rule out pneumonia  No Known Allergies  Patient Measurements: 95 kg  Vital Signs: Temp: 99.8 F (37.7 C) (01/05 2114) Temp Source: Oral (01/05 2114) BP: 156/78 mmHg (01/05 2130) Pulse Rate: 98 (01/05 2130)  Labs:  Recent Labs  04/16/15 1949  WBC 9.3  HGB 8.3*  PLT 505*  CREATININE 0.86   Estimated Creatinine Clearance: 58.4 mL/min (by C-G formula based on Cr of 0.86). No results for input(s): VANCOTROUGH, VANCOPEAK, VANCORANDOM, GENTTROUGH, GENTPEAK, GENTRANDOM, TOBRATROUGH, TOBRAPEAK, TOBRARND, AMIKACINPEAK, AMIKACINTROU, AMIKACIN in the last 72 hours.   Microbiology: Recent Results (from the past 720 hour(s))  MRSA PCR Screening     Status: None   Collection Time: 04/03/15  2:37 PM  Result Value Ref Range Status   MRSA by PCR NEGATIVE NEGATIVE Final    Comment:        The GeneXpert MRSA Assay (FDA approved for NASAL specimens only), is one component of a comprehensive MRSA colonization surveillance program. It is not intended to diagnose MRSA infection nor to guide or monitor treatment for MRSA infections.     Medical History: Past Medical History  Diagnosis Date  . Rheumatoid arthritis(714.0)   . Unspecified essential hypertension   . Esophageal reflux   . Hypothyroidism   . Diverticulosis   . Insomnia   . OSA (obstructive sleep apnea)   . OAB (overactive bladder)   . LBP (low back pain)   . DOE (dyspnea on exertion)   . Shoulder pain, bilateral   . GERD (gastroesophageal reflux disease)   . OAB (overactive bladder)   . Depression   . Venous insufficiency   . Cardiomegaly   . Osteoarthritis      Assessment: 82 y/o F here with GIB, pt also with some cough/SOB, WBC WNL, renal function age appropriate, starting broad spectrum anti-biotics.   Goal of Therapy:  Vancomycin trough level 15-20 mcg/ml  Plan:  -Vancomycin 1250 mg IV x 1 ordered in ED,  then 750 mg IV q12h -Ceftazidime 1g IV q8h -Trend WBC, temp, renal function -Drug levels as indicated   Narda Bonds 04/16/2015,11:45 PM

## 2015-04-17 ENCOUNTER — Encounter (HOSPITAL_COMMUNITY): Payer: Self-pay | Admitting: Family Medicine

## 2015-04-17 ENCOUNTER — Inpatient Hospital Stay (HOSPITAL_COMMUNITY): Payer: Medicare HMO

## 2015-04-17 DIAGNOSIS — I10 Essential (primary) hypertension: Secondary | ICD-10-CM | POA: Diagnosis present

## 2015-04-17 DIAGNOSIS — G4733 Obstructive sleep apnea (adult) (pediatric): Secondary | ICD-10-CM

## 2015-04-17 DIAGNOSIS — S42302S Unspecified fracture of shaft of humerus, left arm, sequela: Secondary | ICD-10-CM

## 2015-04-17 DIAGNOSIS — I5032 Chronic diastolic (congestive) heart failure: Secondary | ICD-10-CM

## 2015-04-17 DIAGNOSIS — R05 Cough: Secondary | ICD-10-CM | POA: Diagnosis present

## 2015-04-17 DIAGNOSIS — J111 Influenza due to unidentified influenza virus with other respiratory manifestations: Secondary | ICD-10-CM | POA: Diagnosis not present

## 2015-04-17 DIAGNOSIS — E039 Hypothyroidism, unspecified: Secondary | ICD-10-CM | POA: Diagnosis present

## 2015-04-17 DIAGNOSIS — K219 Gastro-esophageal reflux disease without esophagitis: Secondary | ICD-10-CM | POA: Diagnosis present

## 2015-04-17 DIAGNOSIS — Z7982 Long term (current) use of aspirin: Secondary | ICD-10-CM | POA: Diagnosis not present

## 2015-04-17 DIAGNOSIS — E875 Hyperkalemia: Secondary | ICD-10-CM

## 2015-04-17 DIAGNOSIS — K5900 Constipation, unspecified: Secondary | ICD-10-CM | POA: Diagnosis present

## 2015-04-17 DIAGNOSIS — Z87891 Personal history of nicotine dependence: Secondary | ICD-10-CM | POA: Diagnosis not present

## 2015-04-17 DIAGNOSIS — J189 Pneumonia, unspecified organism: Secondary | ICD-10-CM

## 2015-04-17 DIAGNOSIS — Y95 Nosocomial condition: Secondary | ICD-10-CM | POA: Diagnosis present

## 2015-04-17 DIAGNOSIS — F410 Panic disorder [episodic paroxysmal anxiety] without agoraphobia: Secondary | ICD-10-CM | POA: Diagnosis present

## 2015-04-17 DIAGNOSIS — J101 Influenza due to other identified influenza virus with other respiratory manifestations: Secondary | ICD-10-CM | POA: Diagnosis not present

## 2015-04-17 DIAGNOSIS — E871 Hypo-osmolality and hyponatremia: Secondary | ICD-10-CM | POA: Diagnosis present

## 2015-04-17 DIAGNOSIS — N3944 Nocturnal enuresis: Secondary | ICD-10-CM | POA: Diagnosis present

## 2015-04-17 DIAGNOSIS — K5909 Other constipation: Secondary | ICD-10-CM | POA: Diagnosis not present

## 2015-04-17 DIAGNOSIS — M069 Rheumatoid arthritis, unspecified: Secondary | ICD-10-CM | POA: Diagnosis present

## 2015-04-17 DIAGNOSIS — K5790 Diverticulosis of intestine, part unspecified, without perforation or abscess without bleeding: Secondary | ICD-10-CM | POA: Diagnosis present

## 2015-04-17 DIAGNOSIS — J09X1 Influenza due to identified novel influenza A virus with pneumonia: Secondary | ICD-10-CM | POA: Diagnosis present

## 2015-04-17 DIAGNOSIS — D649 Anemia, unspecified: Secondary | ICD-10-CM | POA: Diagnosis present

## 2015-04-17 DIAGNOSIS — Z9181 History of falling: Secondary | ICD-10-CM | POA: Diagnosis not present

## 2015-04-17 DIAGNOSIS — G4700 Insomnia, unspecified: Secondary | ICD-10-CM | POA: Diagnosis present

## 2015-04-17 LAB — URINALYSIS, ROUTINE W REFLEX MICROSCOPIC
Bilirubin Urine: NEGATIVE
GLUCOSE, UA: NEGATIVE mg/dL
HGB URINE DIPSTICK: NEGATIVE
Ketones, ur: NEGATIVE mg/dL
Leukocytes, UA: NEGATIVE
Nitrite: NEGATIVE
PROTEIN: NEGATIVE mg/dL
Specific Gravity, Urine: 1.01 (ref 1.005–1.030)
pH: 8 (ref 5.0–8.0)

## 2015-04-17 LAB — BASIC METABOLIC PANEL
Anion gap: 9 (ref 5–15)
BUN: 7 mg/dL (ref 6–20)
CALCIUM: 8.3 mg/dL — AB (ref 8.9–10.3)
CO2: 29 mmol/L (ref 22–32)
CREATININE: 0.86 mg/dL (ref 0.44–1.00)
Chloride: 96 mmol/L — ABNORMAL LOW (ref 101–111)
GFR calc Af Amer: >60 mL/min (ref >60)
GLUCOSE: 109 mg/dL — AB (ref 65–99)
Potassium: 4.1 mmol/L (ref 3.5–5.1)
Sodium: 134 mmol/L — ABNORMAL LOW (ref 135–145)

## 2015-04-17 LAB — CBC
HCT: 25.5 % — ABNORMAL LOW (ref 36.0–46.0)
HEMOGLOBIN: 8 g/dL — AB (ref 12.0–15.0)
MCH: 29.1 pg (ref 26.0–34.0)
MCHC: 31.4 g/dL (ref 30.0–36.0)
MCV: 92.7 fL (ref 78.0–100.0)
PLATELETS: 471 10*3/uL — AB (ref 150–400)
RBC: 2.75 MIL/uL — AB (ref 3.87–5.11)
RDW: 14.1 % (ref 11.5–15.5)
WBC: 7.2 10*3/uL (ref 4.0–10.5)

## 2015-04-17 LAB — LACTIC ACID, PLASMA: LACTIC ACID, VENOUS: 1.2 mmol/L (ref 0.5–2.0)

## 2015-04-17 LAB — PROCALCITONIN: Procalcitonin: <0.1 ng/mL

## 2015-04-17 MED ORDER — POLYSACCHARIDE IRON COMPLEX 150 MG PO CAPS
150.0000 mg | ORAL_CAPSULE | Freq: Two times a day (BID) | ORAL | Status: DC
Start: 2015-04-17 — End: 2015-04-21
  Administered 2015-04-17 – 2015-04-21 (×9): 150 mg via ORAL
  Filled 2015-04-17 (×9): qty 1

## 2015-04-17 MED ORDER — SODIUM CHLORIDE 0.9 % IJ SOLN
3.0000 mL | Freq: Two times a day (BID) | INTRAMUSCULAR | Status: DC
  Administered 2015-04-17 – 2015-04-21 (×7): 3 mL via INTRAVENOUS

## 2015-04-17 MED ORDER — BENZONATATE 100 MG PO CAPS
100.0000 mg | ORAL_CAPSULE | Freq: Three times a day (TID) | ORAL | Status: DC | PRN
  Administered 2015-04-17 (×2): 100 mg via ORAL
  Filled 2015-04-17 (×2): qty 1

## 2015-04-17 MED ORDER — OXYMETAZOLINE HCL 0.05 % NA SOLN
1.0000 | Freq: Two times a day (BID) | NASAL | Status: DC | PRN
  Filled 2015-04-17: qty 15

## 2015-04-17 MED ORDER — POLYETHYLENE GLYCOL 3350 17 G PO PACK
17.0000 g | PACK | Freq: Every day | ORAL | Status: DC
  Administered 2015-04-17 – 2015-04-19 (×2): 17 g via ORAL
  Filled 2015-04-17 (×2): qty 1

## 2015-04-17 MED ORDER — MELATONIN 5 MG PO TABS: 5.0000 mg | ORAL_TABLET | Freq: Every day | ORAL | Status: DC

## 2015-04-17 MED ORDER — PANTOPRAZOLE SODIUM 40 MG PO TBEC
40.0000 mg | DELAYED_RELEASE_TABLET | Freq: Every day | ORAL | Status: DC
  Administered 2015-04-17 – 2015-04-21 (×5): 40 mg via ORAL
  Filled 2015-04-17 (×5): qty 1

## 2015-04-17 MED ORDER — ASPIRIN EC 81 MG PO TBEC
81.0000 mg | DELAYED_RELEASE_TABLET | Freq: Every day | ORAL | Status: DC
  Administered 2015-04-17 – 2015-04-21 (×5): 81 mg via ORAL
  Filled 2015-04-17 (×5): qty 1

## 2015-04-17 MED ORDER — MUPIROCIN 2 % EX OINT
1.0000 "application " | TOPICAL_OINTMENT | Freq: Every day | CUTANEOUS | Status: DC
  Administered 2015-04-17 – 2015-04-21 (×5): 1 via NASAL
  Filled 2015-04-17 (×2): qty 22

## 2015-04-17 MED ORDER — NORTRIPTYLINE HCL 25 MG PO CAPS
25.0000 mg | ORAL_CAPSULE | Freq: Every day | ORAL | Status: DC
  Administered 2015-04-17 – 2015-04-20 (×5): 25 mg via ORAL
  Filled 2015-04-17 (×7): qty 1

## 2015-04-17 MED ORDER — HYDROCORTISONE 2.5 % RE CREA
TOPICAL_CREAM | Freq: Two times a day (BID) | RECTAL | Status: DC
  Administered 2015-04-17 – 2015-04-21 (×8): via RECTAL
  Filled 2015-04-17 (×2): qty 28.35

## 2015-04-17 MED ORDER — ENOXAPARIN SODIUM 40 MG/0.4ML ~~LOC~~ SOLN
40.0000 mg | SUBCUTANEOUS | Status: DC
  Administered 2015-04-17 – 2015-04-21 (×5): 40 mg via SUBCUTANEOUS
  Filled 2015-04-17 (×5): qty 0.4

## 2015-04-17 MED ORDER — LEVOTHYROXINE SODIUM 100 MCG PO TABS
100.0000 ug | ORAL_TABLET | Freq: Every day | ORAL | Status: DC
  Administered 2015-04-17 – 2015-04-21 (×5): 100 ug via ORAL
  Filled 2015-04-17 (×5): qty 1

## 2015-04-17 MED ORDER — HYDROCODONE-ACETAMINOPHEN 7.5-325 MG PO TABS
1.0000 | ORAL_TABLET | Freq: Four times a day (QID) | ORAL | Status: DC | PRN
  Administered 2015-04-17 – 2015-04-21 (×17): 1 via ORAL
  Filled 2015-04-17 (×18): qty 1

## 2015-04-17 MED ORDER — ACETAMINOPHEN 650 MG RE SUPP: 650.0000 mg | Freq: Four times a day (QID) | RECTAL | Status: DC | PRN

## 2015-04-17 MED ORDER — DESMOPRESSIN ACETATE 0.2 MG PO TABS
0.4000 mg | ORAL_TABLET | Freq: Every day | ORAL | Status: DC
  Administered 2015-04-17 – 2015-04-19 (×4): 0.4 mg via ORAL
  Filled 2015-04-17 (×5): qty 2

## 2015-04-17 MED ORDER — ACETAMINOPHEN 325 MG PO TABS
650.0000 mg | ORAL_TABLET | Freq: Four times a day (QID) | ORAL | Status: DC | PRN
  Administered 2015-04-17 – 2015-04-19 (×4): 650 mg via ORAL
  Filled 2015-04-17 (×4): qty 2

## 2015-04-17 MED ORDER — METOPROLOL TARTRATE 50 MG PO TABS
50.0000 mg | ORAL_TABLET | Freq: Two times a day (BID) | ORAL | Status: DC
  Administered 2015-04-17 – 2015-04-21 (×9): 50 mg via ORAL
  Filled 2015-04-17 (×9): qty 1

## 2015-04-17 MED ORDER — BUSPIRONE HCL 15 MG PO TABS
7.5000 mg | ORAL_TABLET | Freq: Three times a day (TID) | ORAL | Status: DC
Start: 2015-04-17 — End: 2015-04-21
  Administered 2015-04-17 – 2015-04-21 (×14): 7.5 mg via ORAL
  Filled 2015-04-17 (×16): qty 1

## 2015-04-17 MED ORDER — MELATONIN 3 MG PO TABS
6.0000 mg | ORAL_TABLET | Freq: Every day | ORAL | Status: DC
  Administered 2015-04-17 – 2015-04-18 (×3): 6 mg via ORAL
  Filled 2015-04-17 (×4): qty 2

## 2015-04-17 MED ORDER — FUROSEMIDE 20 MG PO TABS
20.0000 mg | ORAL_TABLET | Freq: Every day | ORAL | Status: DC
  Administered 2015-04-17 – 2015-04-19 (×3): 20 mg via ORAL
  Filled 2015-04-17 (×3): qty 1

## 2015-04-17 MED ORDER — BENZONATATE 100 MG PO CAPS
100.0000 mg | ORAL_CAPSULE | Freq: Three times a day (TID) | ORAL | Status: DC
  Administered 2015-04-17 – 2015-04-18 (×2): 100 mg via ORAL
  Filled 2015-04-17 (×2): qty 1

## 2015-04-17 MED ORDER — GUAIFENESIN-DM 100-10 MG/5ML PO SYRP
5.0000 mL | ORAL_SOLUTION | ORAL | Status: DC | PRN
  Administered 2015-04-17 – 2015-04-20 (×4): 5 mL via ORAL
  Filled 2015-04-17 (×4): qty 5

## 2015-04-17 MED ORDER — SENNOSIDES-DOCUSATE SODIUM 8.6-50 MG PO TABS
1.0000 | ORAL_TABLET | Freq: Every day | ORAL | Status: DC
  Administered 2015-04-17: 1 via ORAL
  Filled 2015-04-17 (×3): qty 1

## 2015-04-17 MED ORDER — ONDANSETRON HCL 4 MG/2ML IJ SOLN
4.0000 mg | Freq: Four times a day (QID) | INTRAMUSCULAR | Status: DC | PRN
  Filled 2015-04-17: qty 2

## 2015-04-17 NOTE — H&P (Signed)
History and Physical  Patient Name: Lisa Mcclure     [id number]    DOB: 09-May-1932    DOA: 04/16/2015 Referring physician: Theotis Burrow, MD PCP: [name], MD      Chief Complaint: Fever and cough and altered mental status  HPI: Lisa Mcclure is a 82 y.o. female with a past medical history significant for chronic diastolic CHF, HTN, hypothyroidism, and recent hospitalizations for humeral fracture then patellar fracture then hemorrhoidal bleeding who presents with fever and cough from ALF.  The patient was independent, lived in a condo and drove her own car until 6 weeks ago when she fell and broke her humerus.  She was managing this at home when she fell and broke her patella.  11/25-11/29: Broken patella, discharged to [facility] 12/7 discharged to [place], because of desire by family for less PT 12/23-12/25: Readmitted for GIB, meloxican stopped but suspected hemorrhoidal bleeding, discharged with plans for outpatient colonoscopy, discharged back to [place] 12/27-1/2: Readmitted for LGIB again, transfused 2U, flex sig showed diverticular vs. Hemorrhoidal bleeding  The patient was initially recovering well after her last discharge, yesterday seemed to be herself, and then, per daughter, today when family arrived they found her pale, feverish, with chills, coughing, headache, achy, nauseous, "not being herself", with malaise and so they brought her to the ER.  In the ED, patient was febrile to 100.9 F, tachycardic, but with normal CBC.  A CXR was normal, but because of fever and cough, patient was started empirically on HAP treatment and [facility] were called to admit.     Review of Systems:  Pt complains of fever, aches, chills, non-productive cough, headache, nausea, malaise. Pt denies any sputum, dyspnea, confusion.  All other systems negative except as just noted or noted in the history of present illness.  No Known Allergies  Prior to Admission  medications   Medication Sig Start Date End Date Taking? Authorizing Provider  acetaminophen (TYLENOL) 325 MG tablet Take 650 mg by mouth every 6 (six) hours as needed for mild pain.   Yes Historical Provider, MD  aspirin 81 MG tablet Take 81 mg by mouth daily.   Yes Historical Provider, MD  benzonatate (TESSALON PERLES) 100 MG capsule Take 100 mg by mouth 3 (three) times daily as needed for cough.   Yes Historical Provider, MD  busPIRone (BUSPAR) 7.5 MG tablet Take 1 tablet (7.5 mg total) by mouth 3 (three) times daily. 04/13/15  Yes Barton Dubois, MD  cholecalciferol (VITAMIN D) 1000 UNITS tablet Take 1,000 Units by mouth daily.   Yes Historical Provider, MD  desmopressin (DDAVP) 0.2 MG tablet Take 0.4 mg by mouth at bedtime. 02/13/15  Yes Historical Provider, MD  furosemide (LASIX) 20 MG tablet Take 1 tablet (20 mg total) by mouth daily. 04/13/15  Yes Barton Dubois, MD  hydrALAZINE (APRESOLINE) 10 MG tablet Take 10 mg by mouth 2 (two) times daily. HOLD for SBP <110   Yes Historical Provider, MD  HYDROcodone-acetaminophen (NORCO) 7.5-325 MG tablet Take 1 tablet by mouth every 6 (six) hours as needed for moderate pain. 04/13/15  Yes Barton Dubois, MD  hydrocortisone (ANUSOL-HC) 2.5 % rectal cream Place rectally 2 (two) times daily. 04/13/15  Yes Barton Dubois, MD  iron polysaccharides (NIFEREX) 150 MG capsule Take 1 capsule (150 mg total) by mouth 2 (two) times daily. 04/13/15  Yes Barton Dubois, MD  levothyroxine (SYNTHROID, LEVOTHROID) 100 MCG tablet Take 100 mcg by mouth daily before breakfast.   Yes Historical Provider, MD  losartan (COZAAR) 100 MG tablet Take 100 mg by mouth daily.   Yes Historical Provider, MD  Melatonin 5 MG TABS Take 5 mg by mouth at bedtime.    Yes Historical Provider, MD  methocarbamol (ROBAXIN) 500 MG tablet Take 1 tablet (500 mg total) by mouth every 8 (eight) hours as needed for muscle spasms. 04/13/15  Yes Barton Dubois, MD  metoprolol (LOPRESSOR) 50 MG tablet Take 50 mg by mouth  2 (two) times daily.   Yes Historical Provider, MD  Multiple Vitamin (MULTI-DAY VITAMINS PO) Take 1 tablet by mouth daily.    Yes Historical Provider, MD  mupirocin ointment (BACTROBAN) 2 % Place 1 application into the nose daily.   Yes Historical Provider, MD  nortriptyline (PAMELOR) 25 MG capsule Take 25 mg by mouth at bedtime.   Yes Historical Provider, MD  omeprazole (PRILOSEC) 20 MG capsule Take 20 mg by mouth 2 (two) times daily before a meal.    Yes Historical Provider, MD  oxymetazoline (AFRIN) 0.05 % nasal spray Place 1 spray into both nostrils 2 (two) times daily as needed for congestion.   Yes Historical Provider, MD  polyethylene glycol (MIRALAX / GLYCOLAX) packet Take 17 g by mouth daily.    Yes Historical Provider, MD  potassium chloride SA (K-DUR,KLOR-CON) 20 MEQ tablet Take 1 tablet (20 mEq total) by mouth daily. 02/19/15  Yes Dorothy Spark, MD  sennosides-docusate sodium (SENOKOT-S) 8.6-50 MG tablet Take 1 tablet by mouth daily. 04/13/15  Yes Barton Dubois, MD    Past Medical History  Diagnosis Date  . Rheumatoid arthritis(714.0)   . Unspecified essential hypertension   . Esophageal reflux   . Hypothyroidism   . Diverticulosis   . Insomnia   . OSA (obstructive sleep apnea)   . OAB (overactive bladder)   . LBP (low back pain)   . DOE (dyspnea on exertion)   . Shoulder pain, bilateral   . GERD (gastroesophageal reflux disease)   . OAB (overactive bladder)   . Depression   . Venous insufficiency   . Cardiomegaly   . Osteoarthritis     Past Surgical History  Procedure Laterality Date  . Appendectomy  1953  . Vesicovaginal fistula closure w/ tah    . Breast biopsy Right 1980  . Knee arthroscopy w/ autogenous cartilage implantation (aci) procedure Left 1994, 1995  . Knee arthroscopy Right 1996, 2010  . Shoulder surgery  1990  . Cystocele repair    . Cesarean section      1957, 1961, 1964  . Wrist surgery  1967  . Total abdominal hysterectomy  1972  . Bladder  abduction-1996  1996  . Cosmetic surgery  1996  . Flexible sigmoidoscopy N/A 04/10/2015    Procedure: FLEXIBLE SIGMOIDOSCOPY;  Surgeon: Arta Silence, MD;  Location: [facility] END[facility]COPY;  Service: Endoscopy;  Laterality: N/A;    Family history: family history includes Breast cancer in her maternal aunt; COPD in her father; Lung cancer in her mother.  Social History: Patient lived independently until Thanksgiving, and now is in [place].  She has a remote history of smoking. She currently walks with a walker, but previously walks without assistive device.       Physical Exam: BP 156/78 mmHg  Pulse 98  Temp(Src) 99.8 F (37.7 C) (Oral)  Resp 20  SpO2 95% General appearance: Obese adult female, alert and in moderate distress from pain and malaise.   Eyes: Anicteric, conjunctiva pink, lids and lashes normal.     ENT: No nasal  deformity, discharge, or epistaxis.  OP moist without lesions.   Lymph: No cervical lymphadenopathy. Skin: Warm and moist flushed.   Cardiac: Tachycardic, regular, nl S1-S2, soft SEM.  Capillary refill is brisk.  Slight LE edema.  Radial pulses 2+ and symmetric. Respiratory: Coughing.  Normal respiratory rate and rhythm.  CTAB without rales or wheezes. Abdomen: Abdomen soft without rigidity.  No TTP.  MSK: Left arm in sling.  Left knee in cast. Neuro: Sensorium intact and responding to questions, attention normal.  Speech is fluent.  Moves all extremities equally and with normal coordination.    Psych: Behavior appropriate.  Affect anxious.  No evidence of aural or visual hallucinations or delusions.       Labs on Admission:  The metabolic panel shows mild hyperkalemia. The transaminases and bilirubin are normal. The albumin is slightly low. The renal function is normal. Fecal occult blood testing is negative. The complete blood count shows no leukocytosis, thrombocytopenia. Anemia improved from discharge.   Radiological Exams on Admission: Personally reviewed: Dg  Chest 2 View 04/16/2015  CLINICAL DATA:  Cough and fever.  Recent fall. EXAM: CHEST  2 VIEW COMPARISON:  CT 03/09/2015 FINDINGS: The heart is enlarged. There is tortuosity of the thoracic aorta. Mild bibasilar atelectasis or scarring, unchanged. No confluent airspace disease, pleural effusion or pneumothorax. Severe degenerative change in both shoulders. IMPRESSION: Stable cardiomegaly.  Unchanged bibasilar atelectasis or scarring. Electronically Signed   By: Jeb Levering M.D.   On: 04/16/2015 21:55    Echocardiogram 02/2015: Mild Aortic stenosis EF 55-60%      Assessment/Plan 1. Cough and fever:  This is new.  Differential includes bacterial pneumonia or URI.    -Empiric vancomycin and ceftrazidime for now -Check strep pneumo antigen -Procalcitonin and respiratory virus panel and de-escalate if possible    2. Hyperkalemia:  This is new.   -Hold losartan for now  3. Hypothyroidism:  Continue home levothyroxine  4. Chronic diastolic CHF:  -Continue home furosemide -May restart hydralazine and Losartan, pending BP and repeat BMP  5. HTN:  -Hold losartan given K -Hydralazine may be restarted if necessary -Continue metoprolol and furosemide  6. Anxiety:  Buspirone recently started -Continue buspirone -Continue nortriptyline and other pain medicine  7. Patellar fracture and left humeral fracture:  -Acetaminophen and hydrocodone PRN for pain -Continue chronic nortriptyline  8. Nocturnal enuresis: Sodium normal -Continue home desmopressin  9. Consitipation: Chronic, but worse with iron therapy Continue Senokot and Miralax daily     DVT PPx: Lovenox Diet: Regular Consultants: None Code Status: FUll Family Communication: Daughter, present at bedside  Medical decision making: What exists of the patient's previous chart was reviewed in depth and the case was discussed with Dr. Rex Kras. Patient seen 12:37 AM on 04/17/2015.  Disposition Plan:  Admit to tele for  possible HC assoc pneumonia.  De-escalate teratment if able.      Edwin Dada [facility] Pager [phone]

## 2015-04-17 NOTE — Progress Notes (Signed)
Utilization review completed.  

## 2015-04-17 NOTE — Progress Notes (Addendum)
PROGRESS NOTE    Lisa Mcclure [id number] DOB: 21-Mar-1933 DOA: 04/16/2015 PCP: [name], MD  HPI/Brief narrative 82 y.o. female with a past medical history significant for chronic diastolic CHF, HTN, hypothyroidism, and recent hospitalizations for humeral fracture then patellar fracture then hemorrhoidal bleeding who presents with fever and cough from ALF.  The patient was independent, lived in a condo and drove her own car until 6 weeks ago when she fell and broke her humerus. She was managing this at home when she fell and broke her patella.  11/25-11/29: Broken patella, discharged to [facility] 12/7 discharged to [place], because of desire by family for less PT 12/23-12/25: Readmitted for GIB, meloxican stopped but suspected hemorrhoidal bleeding, discharged with plans for outpatient colonoscopy, discharged back to [place] 12/27-1/2: Readmitted for LGIB again, transfused 2U, flex sig showed diverticular vs. Hemorrhoidal bleeding  In the ED, patient was febrile to 100.9 F, tachycardic, but with normal CBC. A CXR was normal, but because of fever and cough, patient was started empirically on HAP treatment and [facility] were called to admit.   Assessment/Plan:  Possible healthcare associated pneumonia versus acute bronchitis - Initial chest x-ray negative for pneumonia. - Empirically started on IV vancomycin and Fortaz. - Lactate normal, pro-calcitonin <0.1, RSV panel pending, urine culture pending - Given negative pro-calcitonin, suspect acute purulent bronchitis but will obtain repeat chest x-ray to see if she has not developed pneumonia since earlier x-ray. - Symptomatic/supportive treatment.  Mild hyperkalemia - Creatinine normal. May be related to ARB-held. Follow repeat BMP.  Hypothyroid - Continue home dose of levothyroxine  Chronic diastolic CHF - Continue home dose of Lasix.  Essential hypertension - Controlled. Holding ARB secondary to mild  hyperkalemia. Continue furosemide, metoprolol and resume hydralazine.  Anxiety - Continue recently started buspirone, amitriptyline  Left Patellar fracture and left humeral fracture - Seen outpatient by Dr. Justice Britain. Treating non-surgically. Pain management.  Nocturnal enuresis - Continue home desmopressin  Constipation - Bowel regimen and monitor.  Anemia - Stable. States black stools but taking iron. FOBT in ED negative. Follow CBCs.  Headache - Possibly related to protracted coughing. Symptomatic treatment. Monitor.    DVT prophylaxis: Lovenox  Code Status: Full  Family Communication: Discussed with patient's daughter at bedside  Disposition Plan: DC home when medically stable   Consultants:  None   Procedures:  None   Antibiotics:  IV Vancomycin 1/5 >  IV Ceftazidime 1/5 >   Subjective: Patient feels slightly better compared to admission. States that she is having headache (mid frontal and back of the head), worse with coughing, intermittent dry cough. No dyspnea or chest pain. Has some upper abdominal pain from frequent coughing. Black stools but taking iron.   Objective: Filed Vitals:   04/17/15 0137 04/17/15 0312 04/17/15 1200 04/17/15 1455  BP: 145/65 132/56  125/22  Pulse: 96 99  70  Temp: 100.3 F (37.9 C) 99 F (37.2 C)  99.8 F (37.7 C)  TempSrc: Oral Oral  Oral  Resp: 20 20  19   Height: 5\' 4"  (1.626 m)     Weight: 98.3 kg (216 lb 11.4 oz)     SpO2: 96% 96% 93% 99%    Intake/Output Summary (Last 24 hours) at 04/17/15 1600 Last data filed at 04/17/15 0902  Gross per 24 hour  Intake    120 ml  Output      0 ml  Net    120 ml   Filed Weights   04/17/15 0137  Weight: 98.3 kg (216 lb 11.4 oz)     Exam:  General exam: Pleasant elderly female lying comfortably propped up in bed . Respiratory system: Clear. No increased work of breathing. Cardiovascular system: S1 & S2 heard, RRR. No JVD, murmurs, gallops, clicks or pedal edema.  Telemetry: Sinus rhythm.  Gastrointestinal system: Abdomen is nondistended, soft and nontender. Normal bowel sounds heard. Central nervous system: Alert and oriented. No focal neurological deficits. Extremities: Symmetric 5 x 5 power.Restricted and slightly painful range of movements of left shoulder. Left lower extremity in immobilizer.   Data Reviewed: Basic Metabolic Panel:  Recent Labs Lab 04/11/15 2203 04/12/15 0349 04/16/15 1949  NA 135 133* 139  K 4.2 3.9 5.3*  CL 98* 97* 101  CO2 30 29 29   GLUCOSE 125* 113* 111*  BUN 9 10 7   CREATININE 0.81 0.85 0.86  CALCIUM 8.3* 8.1* 8.8*   Liver Function Tests:  Recent Labs Lab 04/16/15 1949  AST 15  ALT 11*  ALKPHOS 94  BILITOT 0.3  PROT 6.9  ALBUMIN 3.1*   No results for input(s): LIPASE, AMYLASE in the last 168 hours. No results for input(s): AMMONIA in the last 168 hours. CBC:  Recent Labs Lab 04/11/15 2203 04/12/15 0349 04/13/15 0422 04/16/15 1949 04/17/15 0240  WBC 11.1* 10.1 9.4 9.3 7.2  HGB 8.0* 7.5* 7.4* 8.3* 8.0*  HCT 24.3* 22.9* 22.5* 26.0* 25.5*  MCV 94.6 94.2 93.4 93.9 92.7  PLT 382 337 369 505* 471*   Cardiac Enzymes: No results for input(s): CKTOTAL, CKMB, CKMBINDEX, TROPONINI in the last 168 hours. BNP (last 3 results) No results for input(s): PROBNP in the last 8760 hours. CBG: No results for input(s): GLUCAP in the last 168 hours.  No results found for this or any previous visit (from the past 240 hour(s)).       Studies: Dg Chest 2 View  04/16/2015  CLINICAL DATA:  Cough and fever.  Recent fall. EXAM: CHEST  2 VIEW COMPARISON:  CT 03/09/2015 FINDINGS: The heart is enlarged. There is tortuosity of the thoracic aorta. Mild bibasilar atelectasis or scarring, unchanged. No confluent airspace disease, pleural effusion or pneumothorax. Severe degenerative change in both shoulders. IMPRESSION: Stable cardiomegaly.  Unchanged bibasilar atelectasis or scarring. Electronically Signed   By: Jeb Levering M.D.   On: 04/16/2015 21:55        Scheduled Meds: . aspirin EC  81 mg Oral Daily  . busPIRone  7.5 mg Oral TID  . cefTAZidime (FORTAZ)  IV  1 g Intravenous 3 times per day  . desmopressin  0.4 mg Oral QHS  . enoxaparin (LOVENOX) injection  40 mg Subcutaneous Q24H  . furosemide  20 mg Oral Daily  . hydrocortisone   Rectal BID  . iron polysaccharides  150 mg Oral BID  . levothyroxine  100 mcg Oral QAC breakfast  . Melatonin  6 mg Oral QHS  . metoprolol  50 mg Oral BID  . mupirocin ointment  1 application Nasal Daily  . nortriptyline  25 mg Oral QHS  . pantoprazole  40 mg Oral Daily  . polyethylene glycol  17 g Oral Daily  . senna-docusate  1 tablet Oral Daily  . sodium chloride  3 mL Intravenous Q12H  . vancomycin  750 mg Intravenous Q12H   Continuous Infusions:   Principal Problem:   CAP (community acquired pneumonia) Active Problems:   OSA (obstructive sleep apnea)   Left humeral fracture   Hypothyroid   Chronic diastolic heart failure,  NYHA class 1 (HCC)   Benign essential HTN   Hyperkalemia    Time spent: 35 minutes.    Vernell Leep, MD, FACP, FHM. [facility] Pager [phone]  If 7PM-7AM, please contact night-coverage [email or web address] Password [email or web address] 04/17/2015, 4:00 PM    LOS: 0 days

## 2015-04-17 NOTE — ED Notes (Signed)
Admitting MD at the bedside.  

## 2015-04-17 NOTE — Evaluation (Signed)
Physical Therapy Evaluation and Discharge Patient Details Name: Lisa Mcclure MRN: [id number] DOB: Apr 08, 1933 Today's Date: 04/17/2015   History of Present Illness  Lisa Mcclure is an 82 y.o. female adm with fever, AMS and ?pneumonia. PMHx- fractured kneecap and a fractured shoulder that occurred on 2 different falls requiring SNF (now has moved to assisted living) in hopes to go back to her own home. history of Gerd venous insufficiency cardiomegaly arthritis reflux etc.    Clinical Impression  Patient able to follow all instructions. Daughter present and reports pt is now cognitively intact. Mobilizing at supervision to modified independent level in acute setting. All further PT needs can be met by HHPT at the Assisted Living Facility. No further acute PT needs and will sign off.     Follow Up Recommendations Home health PT;Supervision - Intermittent;Other (comment)    Equipment Recommendations  None recommended by PT    Recommendations for Other Services       Precautions / Restrictions Precautions Precautions: Fall;Knee Shoulder Interventions:  (sling has been d/c'd) Required Braces or Orthoses: Knee Immobilizer - Left Knee Immobilizer - Left: On at all times Restrictions LUE Weight Bearing: Weight bearing as tolerated (per 12/29 ortho office note) LLE Weight Bearing: Weight bearing as tolerated      Mobility  Bed Mobility               General bed mobility comments: sitting EOB with daughter on arrival  Transfers Overall transfer level: Modified independent Equipment used: Rolling walker (2 wheeled);None Transfers: Sit to/from [employer] to Stand: Modified independent (Device/Increase time) Stand pivot transfers: Min guard       General transfer comment: with RW modified independent; pivot chair to [facility] and back with supervision and no device  Ambulation/Gait Ambulation/Gait assistance: Min guard Ambulation Distance (Feet):  5 Feet (x2) Assistive device: Rolling walker (2 wheeled) Gait Pattern/deviations: Step-to pattern;Decreased stride length   Gait velocity interpretation: Below normal speed for age/gender    Stairs            Wheelchair Mobility    Modified Rankin (Stroke Patients Only)       Balance           Standing balance support: No upper extremity supported Standing balance-Leahy Scale: Fair                               Pertinent Vitals/Pain Pain Assessment: No/denies pain    Home Living Family/patient expects to be discharged to:: Assisted living Living Arrangements: Alone             Home Equipment: Cane - single point;Wheelchair - manual;Hospital bed;Other (comment);Walker - 2 wheels (hemiwalker, lift chair)      Prior Function Level of Independence: Needs assistance   Gait / Transfers Assistance Needed: pt was using wheelchair to go to dining hall as well as other places at [employer].  Pt ambulated with hemiwalker with supervision in controlled environment.  Pt could get wheelchair into bathroom on her own to get on toilet.             Hand Dominance   Dominant Hand: Right    Extremity/Trunk Assessment   Upper Extremity Assessment: Overall WFL for tasks assessed           Lower Extremity Assessment: LLE deficits/detail   LLE Deficits / Details: in immobilizer at all times as patella healing.  Cervical / Trunk Assessment:  Kyphotic  Communication   Communication: No difficulties  Cognition Arousal/Alertness: Awake/alert Behavior During Therapy: WFL for tasks assessed/performed Overall Cognitive Status: Within Functional Limits for tasks assessed                      General Comments General comments (skin integrity, edema, etc.): pt with incontinence of urine when stands and coughs; after use of BSC, assisted pt to don briefs eith a pad    Exercises        Assessment/Plan    PT Assessment All further PT needs  can be met in the next venue of care  PT Diagnosis Difficulty walking   PT Problem List Decreased range of motion;Decreased strength;Decreased activity tolerance;Decreased balance;Decreased knowledge of use of DME;Decreased mobility  PT Treatment Interventions     PT Goals (Current goals can be found in the Care Plan section) Acute Rehab PT Goals Patient Stated Goal: to go back to A living PT Goal Formulation: All assessment and education complete, DC therapy    Frequency     Barriers to discharge Decreased caregiver support      Co-evaluation               End of Session Equipment Utilized During Treatment: Left knee immobilizer;Oxygen Activity Tolerance: Patient tolerated treatment well Patient left: in chair;with call bell/phone within reach;with chair alarm set;with nursing/sitter in room Nurse Communication: Mobility status         Time: 9122-5834 PT Time Calculation (min) (ACUTE ONLY): 33 min   Charges:   PT Evaluation $PT Eval Low Complexity: 1 Procedure PT Treatments $Therapeutic Activity: 8-22 mins   PT G Codes:        Everleigh Colclasure 2015-05-12, 3:15 PM Pager [phone]

## 2015-04-17 NOTE — Care Management Note (Signed)
Case Management Note  Patient Details  Name: Atziry Markey MRN: [id number] Date of Birth: 07-22-1932  Subjective/Objective:    Pt admitted with CAP                Action/Plan:  Pt is from [facility], plan is for pt to return there post discharge.  CSW consulted; informed CM that PT needs to evaluate pt prior to discharge.  PT evaluation ordered   Expected Discharge Date:                  Expected Discharge Plan:  Assisted Living / Rest Home ([facility])  In-House Referral:  Clinical Social Work  Discharge planning Services  CM Consult  Post Acute Care Choice:    Choice offered to:     DME Arranged:    DME Agency:     HH Arranged:    HH Agency:     Status of Service:  In process, will continue to follow  Medicare Important Message Given:    Date Medicare IM Given:    Medicare IM give by:    Date Additional Medicare IM Given:    Additional Medicare Important Message give by:     If discussed at [place] of Stay Meetings, dates discussed:    Additional Comments:  Maryclare Labrador, RN 04/17/2015, 1:50 PM

## 2015-04-18 DIAGNOSIS — I10 Essential (primary) hypertension: Secondary | ICD-10-CM

## 2015-04-18 DIAGNOSIS — J101 Influenza due to other identified influenza virus with other respiratory manifestations: Secondary | ICD-10-CM

## 2015-04-18 LAB — URINE CULTURE: Special Requests: NORMAL

## 2015-04-18 LAB — RESPIRATORY VIRUS PANEL
Adenovirus: NEGATIVE
INFLUENZA A: POSITIVE — AB
INFLUENZA B 1: NEGATIVE
METAPNEUMOVIRUS: NEGATIVE
PARAINFLUENZA 2 A: NEGATIVE
PARAINFLUENZA 3 A: NEGATIVE
Parainfluenza 1: NEGATIVE
Respiratory Syncytial Virus A: NEGATIVE
Respiratory Syncytial Virus B: NEGATIVE
Rhinovirus: NEGATIVE

## 2015-04-18 LAB — CBC
HCT: 24.4 % — ABNORMAL LOW (ref 36.0–46.0)
HEMOGLOBIN: 7.6 g/dL — AB (ref 12.0–15.0)
MCH: 28.5 pg (ref 26.0–34.0)
MCHC: 31.1 g/dL (ref 30.0–36.0)
MCV: 91.4 fL (ref 78.0–100.0)
PLATELETS: 440 10*3/uL — AB (ref 150–400)
RBC: 2.67 MIL/uL — AB (ref 3.87–5.11)
RDW: 14.5 % (ref 11.5–15.5)
WBC: 5.6 10*3/uL (ref 4.0–10.5)

## 2015-04-18 MED ORDER — ALBUTEROL SULFATE (2.5 MG/3ML) 0.083% IN NEBU
INHALATION_SOLUTION | RESPIRATORY_TRACT | Status: AC
  Administered 2015-04-18: 2.5 mg
  Filled 2015-04-18: qty 3

## 2015-04-18 MED ORDER — HYDRALAZINE HCL 10 MG PO TABS
10.0000 mg | ORAL_TABLET | Freq: Two times a day (BID) | ORAL | Status: DC
  Administered 2015-04-18 – 2015-04-21 (×7): 10 mg via ORAL
  Filled 2015-04-18 (×7): qty 1

## 2015-04-18 MED ORDER — LOSARTAN POTASSIUM 50 MG PO TABS
100.0000 mg | ORAL_TABLET | Freq: Every day | ORAL | Status: DC
  Administered 2015-04-18 – 2015-04-21 (×4): 100 mg via ORAL
  Filled 2015-04-18 (×4): qty 2

## 2015-04-18 MED ORDER — OSELTAMIVIR PHOSPHATE 75 MG PO CAPS
75.0000 mg | ORAL_CAPSULE | Freq: Two times a day (BID) | ORAL | Status: DC
Start: 2015-04-18 — End: 2015-04-21
  Administered 2015-04-18 – 2015-04-21 (×7): 75 mg via ORAL
  Filled 2015-04-18 (×8): qty 1

## 2015-04-18 MED ORDER — VITAMIN D 1000 UNITS PO TABS
1000.0000 [IU] | ORAL_TABLET | Freq: Every day | ORAL | Status: DC
  Administered 2015-04-18 – 2015-04-21 (×4): 1000 [IU] via ORAL
  Filled 2015-04-18 (×4): qty 1

## 2015-04-18 MED ORDER — ALBUTEROL SULFATE (2.5 MG/3ML) 0.083% IN NEBU
2.5000 mg | INHALATION_SOLUTION | RESPIRATORY_TRACT | Status: DC | PRN
  Administered 2015-04-19 – 2015-04-20 (×3): 2.5 mg via RESPIRATORY_TRACT
  Filled 2015-04-18 (×3): qty 3

## 2015-04-18 MED ORDER — BENZONATATE 100 MG PO CAPS
200.0000 mg | ORAL_CAPSULE | Freq: Three times a day (TID) | ORAL | Status: DC
  Administered 2015-04-18 – 2015-04-19 (×3): 200 mg via ORAL
  Filled 2015-04-18 (×4): qty 2

## 2015-04-18 NOTE — Progress Notes (Signed)
PROGRESS NOTE    Lisa Mcclure [id number] DOB: 07-Dec-1932 DOA: 04/16/2015 PCP: [name], MD  HPI/Brief narrative 82 y.o. female with a past medical history significant for chronic diastolic CHF, HTN, hypothyroidism, and recent hospitalizations for humeral fracture then patellar fracture then hemorrhoidal bleeding who presents with fever and cough from ALF.  The patient was independent, lived in a condo and drove her own car until 6 weeks ago when she fell and broke her humerus. She was managing this at home when she fell and broke her patella.  11/25-11/29: Broken patella, discharged to [facility] 12/7 discharged to [place], because of desire by family for less PT 12/23-12/25: Readmitted for GIB, meloxican stopped but suspected hemorrhoidal bleeding, discharged with plans for outpatient colonoscopy, discharged back to [place] 12/27-1/2: Readmitted for LGIB again, transfused 2U, flex sig showed diverticular vs. Hemorrhoidal bleeding  In the ED, patient was febrile to 100.9 F, tachycardic, but with normal CBC. A CXR was normal, but because of fever and cough, patient was started empirically on HAP treatment and TRH were called to admit.   Assessment/Plan:  Influenza A Respiratory virus panel back this am. Repeat CXR neg. D/c abx. Start tamiflu. D/c telemetry  Mild hyperkalemia resolved  Hypothyroid - Continue home dose of levothyroxine  Chronic diastolic CHF - Continue home dose of Lasix.  Essential hypertension Uncontrolled. Resume home meds  Anxiety - Continue recently started buspirone, amitriptyline  Left Patellar fracture and left humeral fracture - Seen outpatient by Dr. Justice Britain. Treating non-surgically. Pain management.  Nocturnal enuresis - Continue home desmopressin  Constipation - Bowel regimen and monitor.  Anemia Follow CBCs.  DVT prophylaxis: Lovenox  Code Status: Full  Family Communication: Discussed with patient's  daughter at bedside  Disposition Plan: DC home when medically stable   Consultants:  None   Procedures:  None   Antibiotics:  IV Vancomycin 1/5 >  IV Ceftazidime 1/5 >   Subjective: Still coughing. C/o arm and knee pain  Objective: Filed Vitals:   04/17/15 1455 04/17/15 2115 04/18/15 0517 04/18/15 0952  BP: 125/22 171/85 136/54 143/100  Pulse: 70 109 86 85  Temp: 99.8 F (37.7 C) 98.8 F (37.1 C) 100.1 F (37.8 C)   TempSrc: Oral Oral Oral   Resp: 19 20 20    Height:      Weight:      SpO2: 99% 97% 92%     Intake/Output Summary (Last 24 hours) at 04/18/15 1204 Last data filed at 04/18/15 0543  Gross per 24 hour  Intake    490 ml  Output      0 ml  Net    490 ml   Filed Weights   04/17/15 0137  Weight: 98.3 kg (216 lb 11.4 oz)     Exam:  General exam: a and o at edge of bed and on phone Respiratory system: Clear. No increased work of breathing. Cardiovascular system: S1 & S2 heard, RRR. No JVD, murmurs, gallops, clicks or pedal edema. Telemetry: Sinus rhythm.  Gastrointestinal system: Abdomen is nondistended, soft and nontender. Normal bowel sounds heard. Central nervous system: Alert and oriented. No focal neurological deficits. Extremities: no CCE   Data Reviewed: Basic Metabolic Panel:  Recent Labs Lab 04/11/15 2203 04/12/15 0349 04/16/15 1949 04/17/15 1717  NA 135 133* 139 134*  K 4.2 3.9 5.3* 4.1  CL 98* 97* 101 96*  CO2 30 29 29 29   GLUCOSE 125* 113* 111* 109*  BUN 9 10 7 7   CREATININE 0.81  0.85 0.86 0.86  CALCIUM 8.3* 8.1* 8.8* 8.3*   Liver Function Tests:  Recent Labs Lab 04/16/15 1949  AST 15  ALT 11*  ALKPHOS 94  BILITOT 0.3  PROT 6.9  ALBUMIN 3.1*   No results for input(s): LIPASE, AMYLASE in the last 168 hours. No results for input(s): AMMONIA in the last 168 hours. CBC:  Recent Labs Lab 04/12/15 0349 04/13/15 0422 04/16/15 1949 04/17/15 0240 04/18/15 0530  WBC 10.1 9.4 9.3 7.2 5.6  HGB 7.5* 7.4* 8.3* 8.0*  7.6*  HCT 22.9* 22.5* 26.0* 25.5* 24.4*  MCV 94.2 93.4 93.9 92.7 91.4  PLT 337 369 505* 471* 440*   Cardiac Enzymes: No results for input(s): CKTOTAL, CKMB, CKMBINDEX, TROPONINI in the last 168 hours. BNP (last 3 results) No results for input(s): PROBNP in the last 8760 hours. CBG: No results for input(s): GLUCAP in the last 168 hours.  Recent Results (from the past 240 hour(s))  Urine culture     Status: None   Collection Time: 04/16/15 11:48 PM  Result Value Ref Range Status   Specimen Description URINE, CLEAN CATCH  Final   Special Requests Normal  Final   Culture MULTIPLE SPECIES PRESENT, SUGGEST RECOLLECTION  Final   Report Status 04/18/2015 FINAL  Final  Respiratory virus panel     Status: Abnormal   Collection Time: 04/17/15  2:00 AM  Result Value Ref Range Status   Respiratory Syncytial Virus A Negative Negative Final   Respiratory Syncytial Virus B Negative Negative Final   Influenza A Positive (A) Negative Final    Comment: Subtype: H3   Influenza B Negative Negative Final   Parainfluenza 1 Negative Negative Final   Parainfluenza 2 Negative Negative Final   Parainfluenza 3 Negative Negative Final   Metapneumovirus Negative Negative Final   Rhinovirus Negative Negative Final   Adenovirus Negative Negative Final    Comment: (NOTE) Performed At: [facility] [address] [place], [place] [id number] Lindon Romp MD [id number]          Studies: Dg Chest 2 View  04/16/2015  CLINICAL DATA:  Cough and fever.  Recent fall. EXAM: CHEST  2 VIEW COMPARISON:  CT 03/09/2015 FINDINGS: The heart is enlarged. There is tortuosity of the thoracic aorta. Mild bibasilar atelectasis or scarring, unchanged. No confluent airspace disease, pleural effusion or pneumothorax. Severe degenerative change in both shoulders. IMPRESSION: Stable cardiomegaly.  Unchanged bibasilar atelectasis or scarring. Electronically Signed   By: Jeb Levering M.D.   On: 04/16/2015 21:55    Dg Chest Port 1 View  04/17/2015  CLINICAL DATA:  Cough and short of breath 3 days. EXAM: PORTABLE CHEST 1 VIEW COMPARISON:  04/16/2015 FINDINGS: Normal cardiac silhouette. Aorta is ectatic. There is chronic bronchitic markings centrally. No infiltrate or pneumothorax. IMPRESSION: Chronic bronchitic markings.  No acute findings. Electronically Signed   By: Suzy Bouchard M.D.   On: 04/17/2015 17:02        Scheduled Meds: . aspirin EC  81 mg Oral Daily  . benzonatate  200 mg Oral TID  . busPIRone  7.5 mg Oral TID  . cholecalciferol  1,000 Units Oral Daily  . desmopressin  0.4 mg Oral QHS  . enoxaparin (LOVENOX) injection  40 mg Subcutaneous Q24H  . furosemide  20 mg Oral Daily  . hydrALAZINE  10 mg Oral BID  . hydrocortisone   Rectal BID  . iron polysaccharides  150 mg Oral BID  . levothyroxine  100 mcg Oral QAC breakfast  .  losartan  100 mg Oral Daily  . Melatonin  6 mg Oral QHS  . metoprolol  50 mg Oral BID  . mupirocin ointment  1 application Nasal Daily  . nortriptyline  25 mg Oral QHS  . oseltamivir  75 mg Oral BID  . pantoprazole  40 mg Oral Daily  . polyethylene glycol  17 g Oral Daily  . senna-docusate  1 tablet Oral Daily  . sodium chloride  3 mL Intravenous Q12H   Continuous Infusions:   Principal Problem:   CAP (community acquired pneumonia) Active Problems:   OSA (obstructive sleep apnea)   Left humeral fracture   Hypothyroid   Chronic diastolic heart failure, NYHA class 1 (HCC)   Benign essential HTN   Hyperkalemia    Time spent: 25 minutes.  Delfina Redwood, MD [facility]  [email or web address] Password [email or web address] 04/18/2015, 12:04 PM    LOS: 1 day

## 2015-04-19 DIAGNOSIS — K5909 Other constipation: Secondary | ICD-10-CM

## 2015-04-19 DIAGNOSIS — E871 Hypo-osmolality and hyponatremia: Secondary | ICD-10-CM | POA: Diagnosis not present

## 2015-04-19 DIAGNOSIS — F410 Panic disorder [episodic paroxysmal anxiety] without agoraphobia: Secondary | ICD-10-CM | POA: Diagnosis present

## 2015-04-19 DIAGNOSIS — D649 Anemia, unspecified: Secondary | ICD-10-CM | POA: Diagnosis present

## 2015-04-19 DIAGNOSIS — K5900 Constipation, unspecified: Secondary | ICD-10-CM | POA: Diagnosis present

## 2015-04-19 LAB — BASIC METABOLIC PANEL
Anion gap: 7 (ref 5–15)
BUN: 12 mg/dL (ref 6–20)
CHLORIDE: 92 mmol/L — AB (ref 101–111)
CO2: 28 mmol/L (ref 22–32)
CREATININE: 0.69 mg/dL (ref 0.44–1.00)
Calcium: 7.9 mg/dL — ABNORMAL LOW (ref 8.9–10.3)
GFR calc Af Amer: >60 mL/min (ref >60)
GFR calc non Af Amer: >60 mL/min (ref >60)
Glucose, Bld: 113 mg/dL — ABNORMAL HIGH (ref 65–99)
Potassium: 3.9 mmol/L (ref 3.5–5.1)
Sodium: 127 mmol/L — ABNORMAL LOW (ref 135–145)

## 2015-04-19 LAB — CBC
HEMATOCRIT: 23.4 % — AB (ref 36.0–46.0)
HEMOGLOBIN: 7.5 g/dL — AB (ref 12.0–15.0)
MCH: 29 pg (ref 26.0–34.0)
MCHC: 32.1 g/dL (ref 30.0–36.0)
MCV: 90.3 fL (ref 78.0–100.0)
Platelets: 398 10*3/uL (ref 150–400)
RBC: 2.59 MIL/uL — ABNORMAL LOW (ref 3.87–5.11)
RDW: 14.6 % (ref 11.5–15.5)
WBC: 4.3 10*3/uL (ref 4.0–10.5)

## 2015-04-19 MED ORDER — TRAZODONE HCL 50 MG PO TABS
50.0000 mg | ORAL_TABLET | Freq: Every day | ORAL | Status: DC
  Administered 2015-04-19 – 2015-04-20 (×2): 50 mg via ORAL
  Filled 2015-04-19 (×2): qty 1

## 2015-04-19 MED ORDER — BENZONATATE 100 MG PO CAPS
200.0000 mg | ORAL_CAPSULE | Freq: Three times a day (TID) | ORAL | Status: DC | PRN
  Administered 2015-04-19 – 2015-04-20 (×2): 200 mg via ORAL
  Filled 2015-04-19 (×2): qty 2

## 2015-04-19 MED ORDER — POLYETHYLENE GLYCOL 3350 17 G PO PACK
17.0000 g | PACK | Freq: Two times a day (BID) | ORAL | Status: DC
  Administered 2015-04-19 – 2015-04-20 (×2): 17 g via ORAL
  Filled 2015-04-19 (×2): qty 1

## 2015-04-19 MED ORDER — LORAZEPAM 0.5 MG PO TABS
0.5000 mg | ORAL_TABLET | Freq: Once | ORAL | Status: AC
  Administered 2015-04-19: 0.5 mg via ORAL
  Filled 2015-04-19: qty 1

## 2015-04-19 MED ORDER — GUAIFENESIN ER 600 MG PO TB12
600.0000 mg | ORAL_TABLET | Freq: Two times a day (BID) | ORAL | Status: DC
  Administered 2015-04-19 – 2015-04-21 (×5): 600 mg via ORAL
  Filled 2015-04-19 (×5): qty 1

## 2015-04-19 NOTE — Progress Notes (Signed)
Patient's IV leaking when flushed; unable to successfully reposition.  Dr. Conley Canal notified and advises that IV can be kept out.  Will continue to monitor.

## 2015-04-19 NOTE — Progress Notes (Signed)
PROGRESS NOTE    Lisa Mcclure [id number] DOB: May 23, 1932 DOA: 04/16/2015 PCP: [name], MD  HPI/Brief narrative 82 y.o. female with a past medical history significant for chronic diastolic CHF, HTN, hypothyroidism, and recent hospitalizations for humeral fracture then patellar fracture then hemorrhoidal bleeding who presents with fever and cough from ALF.  The patient was independent, lived in a condo and drove her own car until 6 weeks ago when she fell and broke her humerus. She was managing this at home when she fell and broke her patella.  11/25-11/29: Broken patella, discharged to [facility] 12/7 discharged to [place], because of desire by family for less PT 12/23-12/25: Readmitted for GIB, meloxican stopped but suspected hemorrhoidal bleeding, discharged with plans for outpatient colonoscopy, discharged back to [place] 12/27-1/2: Readmitted for LGIB again, transfused 2U, flex sig showed diverticular vs. Hemorrhoidal bleeding  In the ED, patient was febrile to 100.9 F, tachycardic, but with normal CBC. A CXR was normal, but because of fever and cough, patient was started empirically on HAP treatment and TR[facility] were called to admit.   Assessment/Plan:  Influenza A Cont tamiflu  Hyponatremia: monitor  Panic attack: ativan PRN  Insomnia: prn trazodone  Mild hyperkalemia resolved  Hypothyroid - Continue home dose of levothyroxine  Chronic diastolic CHF - Continue home dose of Lasix.  Essential hypertension Uncontrolled. Resume home meds  Anxiety - Continue recently started buspirone, amitriptyline  Left Patellar fracture and left humeral fracture - Seen outpatient by Dr. Justice Britain. Treating non-surgically. Pain management.  Nocturnal enuresis - Continue home desmopressin  Constipation - Bowel regimen and monitor.  Anemia Follow CBCs.  DVT prophylaxis: Lovenox  Code Status: Full  Family Communication: Discussed with patient's  daughter at bedside  Disposition Plan: SNF tomorrow if stable   Consultants:  None   Procedures:  None   Antibiotics:  IV Vancomycin 1/5 >  IV Ceftazidime 1/5 >   Subjective: Having panic attack. Cant sleep. Still with cough. Has been anxious since injury/recurrent hospitalization  Objective: Filed Vitals:   04/18/15 1400 04/18/15 2005 04/19/15 0452 04/19/15 1015  BP: 145/59 148/66 158/66 158/68  Pulse: 72 76 74 69  Temp: 99.9 F (37.7 C) 99.2 F (37.3 C) 98.2 F (36.8 C)   TempSrc: Oral Oral Oral   Resp: 20 20 18    Height:      Weight:      SpO2: 94% 93% 94%    No intake or output data in the 24 hours ending 04/19/15 1044 Filed Weights   04/17/15 0137  Weight: 98.3 kg (216 lb 11.4 oz)     Exam:  General exam: a and o at edge of bed and on phone, anxious appearing Respiratory system: Clear. No increased work of breathing. Cardiovascular system: S1 & S2 heard, RRR. No JVD, murmurs, gallops, clicks or pedal edema. Telemetry: Sinus rhythm.  Gastrointestinal system: Abdomen is nondistended, soft and nontender. Normal bowel sounds heard. Central nervous system: Alert and oriented. No focal neurological deficits. Extremities: no CCE   Data Reviewed: Basic Metabolic Panel:  Recent Labs Lab 04/16/15 1949 04/17/15 1717 04/19/15 0359  NA 139 134* 127*  K 5.3* 4.1 3.9  CL 101 96* 92*  CO2 29 29 28   GLUCOSE 111* 109* 113*  BUN 7 7 12   CREATININE 0.86 0.86 0.69  CALCIUM 8.8* 8.3* 7.9*   Liver Function Tests:  Recent Labs Lab 04/16/15 1949  AST 15  ALT 11*  ALKPHOS 94  BILITOT 0.3  PROT 6.9  ALBUMIN 3.1*   No results for input(s): LIPASE, AMYLASE in the last 168 hours. No results for input(s): AMMONIA in the last 168 hours. CBC:  Recent Labs Lab 04/13/15 0422 04/16/15 1949 04/17/15 0240 04/18/15 0530 04/19/15 0359  WBC 9.4 9.3 7.2 5.6 4.3  HGB 7.4* 8.3* 8.0* 7.6* 7.5*  HCT 22.5* 26.0* 25.5* 24.4* 23.4*  MCV 93.4 93.9 92.7 91.4 90.3   PLT 369 505* 471* 440* 398   Cardiac Enzymes: No results for input(s): CKTOTAL, CKMB, CKMBINDEX, TROPONINI in the last 168 hours. BNP (last 3 results) No results for input(s): PROBNP in the last 8760 hours. CBG: No results for input(s): GLUCAP in the last 168 hours.  Recent Results (from the past 240 hour(s))  Urine culture     Status: None   Collection Time: 04/16/15 11:48 PM  Result Value Ref Range Status   Specimen Description URINE, CLEAN CATCH  Final   Special Requests Normal  Final   Culture MULTIPLE SPECIES PRESENT, SUGGEST RECOLLECTION  Final   Report Status 04/18/2015 FINAL  Final  Respiratory virus panel     Status: Abnormal   Collection Time: 04/17/15  2:00 AM  Result Value Ref Range Status   Respiratory Syncytial Virus A Negative Negative Final   Respiratory Syncytial Virus B Negative Negative Final   Influenza A Positive (A) Negative Final    Comment: Subtype: H3   Influenza B Negative Negative Final   Parainfluenza 1 Negative Negative Final   Parainfluenza 2 Negative Negative Final   Parainfluenza 3 Negative Negative Final   Metapneumovirus Negative Negative Final   Rhinovirus Negative Negative Final   Adenovirus Negative Negative Final    Comment: (NOTE) Performed At: [facility] [place], [place] [id number] Lindon Romp MD [id number]          Studies: Dg Chest Port 1 View  04/17/2015  CLINICAL DATA:  Cough and short of breath 3 days. EXAM: PORTABLE CHEST 1 VIEW COMPARISON:  04/16/2015 FINDINGS: Normal cardiac silhouette. Aorta is ectatic. There is chronic bronchitic markings centrally. No infiltrate or pneumothorax. IMPRESSION: Chronic bronchitic markings.  No acute findings. Electronically Signed   By: Suzy Bouchard M.D.   On: 04/17/2015 17:02        Scheduled Meds: . aspirin EC  81 mg Oral Daily  . busPIRone  7.5 mg Oral TID  . cholecalciferol  1,000 Units Oral Daily  . desmopressin  0.4 mg Oral QHS  .  enoxaparin (LOVENOX) injection  40 mg Subcutaneous Q24H  . furosemide  20 mg Oral Daily  . guaiFENesin  600 mg Oral BID  . hydrALAZINE  10 mg Oral BID  . hydrocortisone   Rectal BID  . iron polysaccharides  150 mg Oral BID  . levothyroxine  100 mcg Oral QAC breakfast  . LORazepam  0.5 mg Oral Once  . losartan  100 mg Oral Daily  . metoprolol  50 mg Oral BID  . mupirocin ointment  1 application Nasal Daily  . nortriptyline  25 mg Oral QHS  . oseltamivir  75 mg Oral BID  . pantoprazole  40 mg Oral Daily  . polyethylene glycol  17 g Oral BID  . senna-docusate  1 tablet Oral Daily  . sodium chloride  3 mL Intravenous Q12H  . traZODone  50 mg Oral QHS   Continuous Infusions:   Principal Problem:   Influenza A Active Problems:   OSA (obstructive sleep apnea)   Left humeral fracture   Hypothyroid  Chronic diastolic heart failure, NYHA class 1 (HCC)   Benign essential HTN   Hyperkalemia   Hyponatremia   Chronic anemia   Panic attack    Time spent: 25 minutes.  Delfina Redwood, MD [facility]  [email or web address] Password [email or web address] 04/19/2015, 10:44 AM    LOS: 2 days

## 2015-04-20 LAB — BASIC METABOLIC PANEL
ANION GAP: 10 (ref 5–15)
BUN: 9 mg/dL (ref 6–20)
CALCIUM: 7.9 mg/dL — AB (ref 8.9–10.3)
CO2: 25 mmol/L (ref 22–32)
Chloride: 91 mmol/L — ABNORMAL LOW (ref 101–111)
Creatinine, Ser: 0.59 mg/dL (ref 0.44–1.00)
GFR calc Af Amer: >60 mL/min (ref >60)
GLUCOSE: 91 mg/dL (ref 65–99)
Potassium: 4 mmol/L (ref 3.5–5.1)
SODIUM: 126 mmol/L — AB (ref 135–145)

## 2015-04-20 LAB — SODIUM, URINE, RANDOM: Sodium, Ur: 27 mmol/L

## 2015-04-20 LAB — TSH: TSH: 2.373 u[IU]/mL (ref 0.350–4.500)

## 2015-04-20 LAB — OSMOLALITY, URINE: Osmolality, Ur: 512 mOsm/kg (ref 300–900)

## 2015-04-20 LAB — HEMOGLOBIN AND HEMATOCRIT, BLOOD
HCT: 22.6 % — ABNORMAL LOW (ref 36.0–46.0)
HEMOGLOBIN: 7.5 g/dL — AB (ref 12.0–15.0)

## 2015-04-20 LAB — OSMOLALITY: Osmolality: 260 mOsm/kg — ABNORMAL LOW (ref 275–295)

## 2015-04-20 MED ORDER — SENNOSIDES-DOCUSATE SODIUM 8.6-50 MG PO TABS
1.0000 | ORAL_TABLET | Freq: Two times a day (BID) | ORAL | Status: DC
  Administered 2015-04-20 – 2015-04-21 (×2): 1 via ORAL
  Filled 2015-04-20 (×2): qty 1

## 2015-04-20 MED ORDER — FUROSEMIDE 20 MG PO TABS
20.0000 mg | ORAL_TABLET | Freq: Every day | ORAL | Status: DC
  Administered 2015-04-21: 20 mg via ORAL
  Filled 2015-04-20: qty 1

## 2015-04-20 NOTE — Progress Notes (Signed)
PROGRESS NOTE    Lisa Mcclure [id number] DOB: November 26, 1932 DOA: 04/16/2015 PCP: [name], MD  HPI/Brief narrative 82 y.o. female with a past medical history significant for chronic diastolic CHF, HTN, hypothyroidism, and recent hospitalizations for humeral fracture then patellar fracture then hemorrhoidal bleeding who presents with fever and cough from ALF.  The patient was independent, lived in a condo and drove her own car until 6 weeks ago when she fell and broke her humerus. She was managing this at home when she fell and broke her patella.  11/25-11/29: Broken patella, discharged to [facility] 12/7 discharged to [place], because of desire by family for less PT 12/23-12/25: Readmitted for GIB, meloxican stopped but suspected hemorrhoidal bleeding, discharged with plans for outpatient colonoscopy, discharged back to [place] 12/27-1/2: Readmitted for LGIB again, transfused 2U, flex sig showed diverticular vs. Hemorrhoidal bleeding  In the ED, patient was febrile to 100.9 F, tachycardic, but with normal CBC. A CXR was normal, but because of fever and cough, patient was started empirically on HAP treatment and TR[facility] were called to admit.   Assessment/Plan:  Influenza A Cont tamiflu  Hyponatremia: worse. D/c DDAVP, check TSH.   Panic attack: ativan PRN  Insomnia: prn trazodone  Mild hyperkalemia resolved  Hypothyroid - Continue home dose of levothyroxine  Chronic diastolic CHF - Continue home dose of Lasix.  Essential hypertension Cont home meds  Anxiety - Continue recently started buspirone, amitriptyline  Left Patellar fracture and left humeral fracture - Seen outpatient by Dr. Justice Britain. Treating non-surgically. Pain management.  Nocturnal enuresis D/c due to hyponatremia  Constipation - Bowel regimen and monitor.  Anemia Follow CBCs. No bleeding  DVT prophylaxis: Lovenox  Code Status: Full  Family Communication: Discussed  with patient's daughter at bedside  Disposition Plan: SNF when sodium stabilized   Consultants:  None   Procedures:  None   Antibiotics:  IV Vancomycin 1/5 >  IV Ceftazidime 1/5 >   Subjective: Having panic attack. Cant sleep. Still with cough. Has been anxious since injury/recurrent hospitalization  Objective: Filed Vitals:   04/19/15 1944 04/20/15 0353 04/20/15 1021 04/20/15 1310  BP: 179/81 145/77 156/59 156/60  Pulse: 70 63 68 56  Temp: 97.6 F (36.4 C) 97.7 F (36.5 C) 97.8 F (36.6 C) 97.4 F (36.3 C)  TempSrc: Oral Oral Oral Oral  Resp: 18 18 16 16   Height:      Weight:      SpO2: 97% 98% 98% 98%    Intake/Output Summary (Last 24 hours) at 04/20/15 1712 Last data filed at 04/20/15 1309  Gross per 24 hour  Intake    480 ml  Output    100 ml  Net    380 ml   Filed Weights   04/17/15 0137  Weight: 98.3 kg (216 lb 11.4 oz)     Exam:  General exam: a and o occasional cough. Calm and cooperative Respiratory system: Clear. No increased work of breathing. Cardiovascular system: S1 & S2 heard, RRR. No JVD, murmurs, gallops, clicks or pedal edema. Telemetry: Sinus rhythm.  Gastrointestinal system: Abdomen is nondistended, soft and nontender. Normal bowel sounds heard. Central nervous system: Alert and oriented. No focal neurological deficits. Extremities: no CCE   Data Reviewed: Basic Metabolic Panel:  Recent Labs Lab 04/16/15 1949 04/17/15 1717 04/19/15 0359 04/20/15 0545  NA 139 134* 127* 126*  K 5.3* 4.1 3.9 4.0  CL 101 96* 92* 91*  CO2 29 29 28 25   GLUCOSE 111* 109* 113*  91  BUN 7 7 12 9   CREATININE 0.86 0.86 0.69 0.59  CALCIUM 8.8* 8.3* 7.9* 7.9*   Liver Function Tests:  Recent Labs Lab 04/16/15 1949  AST 15  ALT 11*  ALKPHOS 94  BILITOT 0.3  PROT 6.9  ALBUMIN 3.1*   No results for input(s): LIPASE, AMYLASE in the last 168 hours. No results for input(s): AMMONIA in the last 168 hours. CBC:  Recent Labs Lab  04/16/15 1949 04/17/15 0240 04/18/15 0530 04/19/15 0359 04/20/15 0545  WBC 9.3 7.2 5.6 4.3  --   HGB 8.3* 8.0* 7.6* 7.5* 7.5*  HCT 26.0* 25.5* 24.4* 23.4* 22.6*  MCV 93.9 92.7 91.4 90.3  --   PLT 505* 471* 440* 398  --    Cardiac Enzymes: No results for input(s): CKTOTAL, CKMB, CKMBINDEX, TROPONINI in the last 168 hours. BNP (last 3 results) No results for input(s): PROBNP in the last 8760 hours. CBG: No results for input(s): GLUCAP in the last 168 hours.  Recent Results (from the past 240 hour(s))  Urine culture     Status: None   Collection Time: 04/16/15 11:48 PM  Result Value Ref Range Status   Specimen Description URINE, CLEAN CATCH  Final   Special Requests Normal  Final   Culture MULTIPLE SPECIES PRESENT, SUGGEST RECOLLECTION  Final   Report Status 04/18/2015 FINAL  Final  Respiratory virus panel     Status: Abnormal   Collection Time: 04/17/15  2:00 AM  Result Value Ref Range Status   Respiratory Syncytial Virus A Negative Negative Final   Respiratory Syncytial Virus B Negative Negative Final   Influenza A Positive (A) Negative Final    Comment: Subtype: H3   Influenza B Negative Negative Final   Parainfluenza 1 Negative Negative Final   Parainfluenza 2 Negative Negative Final   Parainfluenza 3 Negative Negative Final   Metapneumovirus Negative Negative Final   Rhinovirus Negative Negative Final   Adenovirus Negative Negative Final    Comment: (NOTE) Performed At: [facility] [place], [place] [id number] Lindon Romp MD [id number]          Studies: No results found.      Scheduled Meds: . aspirin EC  81 mg Oral Daily  . busPIRone  7.5 mg Oral TID  . cholecalciferol  1,000 Units Oral Daily  . enoxaparin (LOVENOX) injection  40 mg Subcutaneous Q24H  . guaiFENesin  600 mg Oral BID  . hydrALAZINE  10 mg Oral BID  . hydrocortisone   Rectal BID  . iron polysaccharides  150 mg Oral BID  . levothyroxine  100 mcg Oral  QAC breakfast  . losartan  100 mg Oral Daily  . metoprolol  50 mg Oral BID  . mupirocin ointment  1 application Nasal Daily  . nortriptyline  25 mg Oral QHS  . oseltamivir  75 mg Oral BID  . pantoprazole  40 mg Oral Daily  . polyethylene glycol  17 g Oral BID  . senna-docusate  1 tablet Oral Daily  . sodium chloride  3 mL Intravenous Q12H  . traZODone  50 mg Oral QHS   Continuous Infusions:   Principal Problem:   Influenza A Active Problems:   OSA (obstructive sleep apnea)   Left humeral fracture   Hypothyroid   Chronic diastolic heart failure, NYHA class 1 (HCC)   Benign essential HTN   Hyperkalemia   Hyponatremia   Chronic anemia   Panic attack   Constipation    Time  spent: 25 minutes.  Delfina Redwood, MD [facility]  [email or web address] Password [email or web address] 04/20/2015, 5:12 PM    LOS: 3 days

## 2015-04-20 NOTE — Care Management Note (Addendum)
Case Management Note  Patient Details  Name: Shae-Lynn Chila MRN: [id number] Date of Birth: 1932-05-08  Subjective/Objective:    Pt admitted with CAP                Action/Plan:  Pt is from [facility], plan is for pt to return there post discharge.  CSW consulted; informed CM that PT needs to evaluate pt prior to discharge.  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  CM was informed that community will set up [facility] recommendations through their internal agency [facility].  CM will consult with CSW to provide [facility] order in discharge packet.  CM asked for [place] orders during progression Maryclare Labrador, RN 04/20/2015, 11:41 AM

## 2015-04-21 LAB — BASIC METABOLIC PANEL
ANION GAP: 7 (ref 5–15)
BUN: 9 mg/dL (ref 6–20)
CALCIUM: 7.8 mg/dL — AB (ref 8.9–10.3)
CO2: 28 mmol/L (ref 22–32)
Chloride: 88 mmol/L — ABNORMAL LOW (ref 101–111)
Creatinine, Ser: 0.67 mg/dL (ref 0.44–1.00)
GFR calc Af Amer: >60 mL/min (ref >60)
GLUCOSE: 104 mg/dL — AB (ref 65–99)
Potassium: 4.1 mmol/L (ref 3.5–5.1)
SODIUM: 123 mmol/L — AB (ref 135–145)

## 2015-04-21 LAB — CBC
HCT: 22.9 % — ABNORMAL LOW (ref 36.0–46.0)
Hemoglobin: 7.4 g/dL — ABNORMAL LOW (ref 12.0–15.0)
MCH: 28.6 pg (ref 26.0–34.0)
MCHC: 32.3 g/dL (ref 30.0–36.0)
MCV: 88.4 fL (ref 78.0–100.0)
Platelets: 374 10*3/uL (ref 150–400)
RBC: 2.59 MIL/uL — ABNORMAL LOW (ref 3.87–5.11)
RDW: 14.6 % (ref 11.5–15.5)
WBC: 4.7 10*3/uL (ref 4.0–10.5)

## 2015-04-21 LAB — SODIUM: SODIUM: 127 mmol/L — AB (ref 135–145)

## 2015-04-21 MED ORDER — FUROSEMIDE 40 MG PO TABS: 40.0000 mg | ORAL_TABLET | Freq: Every day | ORAL | Status: DC

## 2015-04-21 MED ORDER — OSELTAMIVIR PHOSPHATE 75 MG PO CAPS: 75.0000 mg | ORAL_CAPSULE | Freq: Two times a day (BID) | ORAL | Status: DC

## 2015-04-21 MED ORDER — TRAZODONE HCL 50 MG PO TABS: 50.0000 mg | ORAL_TABLET | Freq: Every day | ORAL | Status: DC

## 2015-04-21 MED ORDER — DEMECLOCYCLINE HCL 150 MG PO TABS
300.0000 mg | ORAL_TABLET | Freq: Four times a day (QID) | ORAL | Status: DC
Start: 2015-04-21 — End: 2015-04-21
  Administered 2015-04-21 (×2): 300 mg via ORAL
  Filled 2015-04-21 (×2): qty 2

## 2015-04-21 MED ORDER — GUAIFENESIN-DM 100-10 MG/5ML PO SYRP: 5.0000 mL | ORAL_SOLUTION | ORAL | Status: DC | PRN

## 2015-04-21 MED ORDER — SODIUM CHLORIDE 1 G PO TABS: 1.0000 g | ORAL_TABLET | Freq: Three times a day (TID) | ORAL | Status: DC

## 2015-04-21 NOTE — NC FL2 (Signed)
[place] LEVEL OF CARE SCREENING TOOL     IDENTIFICATION  Patient Name: Lisa Mcclure Birthdate: 21-Apr-1932 Sex: female Admission Date (Current Location): 04/16/2015  [facility] and [place] Number:  Herbalist and Address:  [facility]. [facility], [place] [address], [place], [place] [address]      Provider Number: [id number]  Attending Physician Name and Address:  Delfina Redwood, MD  Relative Name and Phone Number:       Current Level of Care: Hospital Recommended Level of Care: [place] Prior Approval Number:    Date Approved/Denied:   PASRR Number:    Discharge Plan:  (ALF (assisted living facility))    Current Diagnoses: Patient Active Problem List   Diagnosis Date Noted  . Hyponatremia 04/19/2015  . Chronic anemia 04/19/2015  . Panic attack 04/19/2015  . Constipation 04/19/2015  . Hyperkalemia 04/17/2015  . Influenza A 04/16/2015  . Proximal humerus fracture   . Left patella fracture   . Benign essential HTN   . Esophageal reflux   . Chronic diastolic heart failure ([place])   . Thyroid activity decreased   . Lower GI bleed 04/08/2015  . Hematochezia   . Rectal bleeding 04/03/2015  . Patellar fracture 03/06/2015  . Chronic diastolic heart failure, NYHA class 1 ([place]) 03/06/2015  . Left humeral fracture 11/04/2013  . Rib fracture 11/04/2013  . Hypothyroid 11/04/2013  . Hypertension 11/04/2013  . OSA (obstructive sleep apnea) 11/13/2008  . BOOP (bronchiolitis obliterans with organizing pneumonia) ([place]) 08/25/2008    Orientation RESPIRATION BLADDER Height & Weight    Self, Time, Situation, Place  Normal Incontinent 5\' 4"  (162.6 cm) 216 lbs.  BEHAVIORAL SYMPTOMS/MOOD NEUROLOGICAL BOWEL NUTRITION STATUS      Continent No added salt  AMBULATORY STATUS COMMUNICATION OF NEEDS Skin   Limited Assist Verbally Normal                       Personal Care Assistance Level of Assistance  Bathing,  Feeding, Dressing Bathing Assistance: Limited assistance Feeding assistance: Independent Dressing Assistance: Limited assistance     Functional Limitations Info  Sight Sight Info: Impaired        SPECIAL CARE FACTORS FREQUENCY  PT (By licensed PT), OT (By licensed OT)     PT Frequency: HH- 3/wk OT Frequency: HH- 3/wk            Contractures      Additional Factors Info  Code Status, Allergies Code Status Info: FULL Allergies Info: NKA            Discharge Medications: Current Discharge Medication List    START taking these medications   Details  guaiFENesin-dextromethorphan (ROBITUSSIN DM) 100-10 MG/5ML syrup Take 5 mLs by mouth every 4 (four) hours as needed for cough. Qty: 118 mL, Refills: 0    oseltamivir (TAMIFLU) 75 MG capsule Take 1 capsule (75 mg total) by mouth 2 (two) times daily. Until gone Qty: 3 capsule, Refills: 0    traZODone (DESYREL) 50 MG tablet Take 1 tablet (50 mg total) by mouth at bedtime. For sleep Qty: 30 tablet, Refills: 0      CONTINUE these medications which have NOT CHANGED   Details  acetaminophen (TYLENOL) 325 MG tablet Take 650 mg by mouth every 6 (six) hours as needed for mild pain.    aspirin 81 MG tablet Take 81 mg by mouth daily.    benzonatate (TESSALON PERLES) 100 MG capsule Take  100 mg by mouth 3 (three) times daily as needed for cough.    busPIRone (BUSPAR) 7.5 MG tablet Take 1 tablet (7.5 mg total) by mouth 3 (three) times daily. Qty: 20 tablet, Refills: 0    cholecalciferol (VITAMIN D) 1000 UNITS tablet Take 1,000 Units by mouth daily.    furosemide (LASIX) 20 MG tablet Take 1 tablet (20 mg total) by mouth daily. Qty: 30 tablet, Refills: 1    hydrALAZINE (APRESOLINE) 10 MG tablet Take 10 mg by mouth 2 (two) times daily. HOLD for SBP <110    HYDROcodone-acetaminophen (NORCO) 7.5-325 MG tablet Take 1 tablet by mouth every 6 (six) hours as needed for moderate pain. Qty: 20  tablet, Refills: 0    iron polysaccharides (NIFEREX) 150 MG capsule Take 1 capsule (150 mg total) by mouth 2 (two) times daily. Qty: 60 capsule, Refills: 1    levothyroxine (SYNTHROID, LEVOTHROID) 100 MCG tablet Take 100 mcg by mouth daily before breakfast.    losartan (COZAAR) 100 MG tablet Take 100 mg by mouth daily.    metoprolol (LOPRESSOR) 50 MG tablet Take 50 mg by mouth 2 (two) times daily.    Multiple Vitamin (MULTI-DAY VITAMINS PO) Take 1 tablet by mouth daily.     mupirocin ointment (BACTROBAN) 2 % Place 1 application into the nose daily.    omeprazole (PRILOSEC) 20 MG capsule Take 20 mg by mouth 2 (two) times daily before a meal.     polyethylene glycol (MIRALAX / GLYCOLAX) packet Take 17 g in 8oz liquid by mouth daily    potassium chloride SA (K-DUR,KLOR-CON) 20 MEQ tablet Take 1 tablet (20 mEq total) by mouth daily. Qty: 90 tablet, Refills: 3   Associated Diagnoses: Essential hypertension; Congestive heart failure, unspecified congestive heart failure chronicity, unspecified congestive heart failure type (HCC)    sennosides-docusate sodium (SENOKOT-S) 8.6-50 MG tablet Take 1 tablet by mouth daily.      STOP taking these medications     desmopressin (DDAVP) 0.2 MG tablet      hydrocortisone (ANUSOL-HC) 2.5 % rectal cream      Melatonin 5 MG TABS      nortriptyline (PAMELOR) 25 MG capsule      oxymetazoline (AFRIN) 0.05 % nasal spray              Relevant Imaging Results:  Relevant Lab Results:   Additional Information SS#: [id number]  Lisa Mcclure, Lisa Mcclure

## 2015-04-21 NOTE — Progress Notes (Signed)
CSW faxed DC and fl2 to facility- they have reviewed and approved of DC paperwork  Patient will discharge to [facility] Anticipated discharge date:1/10 Family notified: pt dtr Transportation by [employer]- called at [place] signing off.  Domenica Reamer, [place] Social Worker [phone]

## 2015-04-21 NOTE — Discharge Summary (Signed)
Physician Discharge Summary  Lisa Mcclure [id number] DOB: 02/08/1933 DOA: 04/16/2015  PCP: [name], MD  Admit date: 04/16/2015 Discharge date: 04/21/2015  Time spent: 60 minutes  Recommendations for Outpatient Follow-up:  1. Back to assisted-living facility with physical therapy 2. Follow-up with primary care provider in 1 week to check basic metabolic panel and hemoglobin 3. Ice pack to left knee twice daily   Discharge Diagnoses:  Principal Problem:   Influenza A Active Problems:   OSA (obstructive sleep apnea)   Left humeral fracture, prior to admission   Hypothyroid   Chronic diastolic heart failure, NYHA class 1 (HCC)   Benign essential HTN   Hyperkalemia   Hyponatremia   Chronic anemia   Anxiety Nocturnal enuresis   Constipation   Discharge Condition: Stable  Diet recommendation: General for a week then resume heart healthy/low sodium once sodium stabilizes  Filed Weights   04/17/15 0137  Weight: 98.3 kg (216 lb 11.4 oz)    History of present illness/Hospital Course:  82 y.o. female with a past medical history significant for chronic diastolic CHF, HTN, hypothyroidism, and recent hospitalizations for humeral fracture then patellar fracture then hemorrhoidal bleeding who presents with fever and cough from ALF.  The patient was independent, lived in a condo and drove her own car until 6 weeks ago when she fell and broke her humerus. She was managing this at home when she fell and broke her patella.  11/25-11/29: Broken patella, discharged to [facility] 12/7 discharged to [facility], because of desire by family for less PT 12/23-12/25: Readmitted for GIB, meloxican stopped but suspected hemorrhoidal bleeding, discharged with plans for outpatient colonoscopy, discharged back to [facility] 12/27-1/2: Readmitted for LGIB again, transfused 2U, flex sig showed diverticular vs. Hemorrhoidal bleeding  In the ED, patient was febrile to 100.9 F,  tachycardic, but with normal CBC. A CXR was normal, but because of fever and cough, patient was started empirically on HAP treatment and [facility] were called to admit.  Repeat chest x-ray showed no infiltrate. Respiratory virus panel showed Influenza A Started on Tamiflu and antibiotics were stopped.  Hyponatremia: Developed during hospitalization, asymptomatic.  TSH normal. Serum osmolality low, urine osmolality inappropriately high. Patient had been on DDAVP prior to admission for nocturnal enuresis. This has been stopped due to the hyponatremia. She has received 2 doses of demeclocycline and her sodium is increasing toward normal. Suspect once acute illness resolved and sodium normalizes may be able to go back on DDAVP but would monitor closely.  Panic attack: Patient has been on BuSpar for several weeks prior to admission but did suffer a panic attack during the hospitalization which responded to Ativan. She prefers not to have a prescription for this.  Insomnia: Nortriptyline changed to trazodone with good results  Mild hyperkalemia resolved  Hypothyroid - Continue home dose of levothyroxine  Chronic diastolic CHF, compensated - Continue home dose of Lasix.  Essential hypertension Cont home meds  Left Patellar fracture and left humeral fracture, prior to admission - Seen outpatient by Dr. Justice Britain. Treating non-surgically. Pain management. Has continued to work with physical therapy and no longer needs her braces.  Nocturnal enuresis DDAVP stopped  Constipation - Bowel regimen continued during hospitalization  Anemia Patient had hemorrhoidal bleeding on previous admissions. No bleeding here. She remains on iron therapy and her hemoglobin has been stable in the mid sevens. Would continue to monitor.   Procedures:  None  Consultations:  None  Discharge Exam: Filed Vitals:   04/21/15 1020  04/21/15 1159  BP: 182/66 153/58  Pulse:  60  Temp:  97.9 F (36.6 C)  Resp:   18    General: Alert, oriented. Occasional cough but no labored breathing. Talkative Cardiovascular: Regular rate rhythm without murmurs gallops rubs Respiratory: Clear to auscultation bilaterally without wheezes rhonchi or rales Extremities: Trace pitting edema  Discharge Instructions   Discharge Instructions    Activity as tolerated - No restrictions    Complete by:  As directed      Diet general    Complete by:  As directed           Current Discharge Medication List    START taking these medications   Details  guaiFENesin-dextromethorphan (ROBITUSSIN DM) 100-10 MG/5ML syrup Take 5 mLs by mouth every 4 (four) hours as needed for cough. Qty: 118 mL, Refills: 0    oseltamivir (TAMIFLU) 75 MG capsule Take 1 capsule (75 mg total) by mouth 2 (two) times daily. Until gone Qty: 3 capsule, Refills: 0    traZODone (DESYREL) 50 MG tablet Take 1 tablet (50 mg total) by mouth at bedtime. For sleep Qty: 30 tablet, Refills: 0      CONTINUE these medications which have NOT CHANGED   Details  acetaminophen (TYLENOL) 325 MG tablet Take 650 mg by mouth every 6 (six) hours as needed for mild pain.    aspirin 81 MG tablet Take 81 mg by mouth daily.    benzonatate (TESSALON PERLES) 100 MG capsule Take 100 mg by mouth 3 (three) times daily as needed for cough.    busPIRone (BUSPAR) 7.5 MG tablet Take 1 tablet (7.5 mg total) by mouth 3 (three) times daily. Qty: 20 tablet, Refills: 0    cholecalciferol (VITAMIN D) 1000 UNITS tablet Take 1,000 Units by mouth daily.    furosemide (LASIX) 20 MG tablet Take 1 tablet (20 mg total) by mouth daily. Qty: 30 tablet, Refills: 1    hydrALAZINE (APRESOLINE) 10 MG tablet Take 10 mg by mouth 2 (two) times daily. HOLD for SBP <110    HYDROcodone-acetaminophen (NORCO) 7.5-325 MG tablet Take 1 tablet by mouth every 6 (six) hours as needed for moderate pain. Qty: 20 tablet, Refills: 0    iron polysaccharides (NIFEREX) 150 MG capsule Take 1 capsule  (150 mg total) by mouth 2 (two) times daily. Qty: 60 capsule, Refills: 1    levothyroxine (SYNTHROID, LEVOTHROID) 100 MCG tablet Take 100 mcg by mouth daily before breakfast.    losartan (COZAAR) 100 MG tablet Take 100 mg by mouth daily.    metoprolol (LOPRESSOR) 50 MG tablet Take 50 mg by mouth 2 (two) times daily.    Multiple Vitamin (MULTI-DAY VITAMINS PO) Take 1 tablet by mouth daily.     mupirocin ointment (BACTROBAN) 2 % Place 1 application into the nose daily.    omeprazole (PRILOSEC) 20 MG capsule Take 20 mg by mouth 2 (two) times daily before a meal.     polyethylene glycol (MIRALAX / GLYCOLAX) packet Take 17 g by mouth daily.     potassium chloride SA (K-DUR,KLOR-CON) 20 MEQ tablet Take 1 tablet (20 mEq total) by mouth daily. Qty: 90 tablet, Refills: 3   Associated Diagnoses: Essential hypertension; Congestive heart failure, unspecified congestive heart failure chronicity, unspecified congestive heart failure type (HCC)    sennosides-docusate sodium (SENOKOT-S) 8.6-50 MG tablet Take 1 tablet by mouth daily.      STOP taking these medications     desmopressin (DDAVP) 0.2 MG tablet  hydrocortisone (ANUSOL-HC) 2.5 % rectal cream      Melatonin 5 MG TABS      nortriptyline (PAMELOR) 25 MG capsule      oxymetazoline (AFRIN) 0.05 % nasal spray        No Known Allergies Follow-up Information    Follow up with [name], MD In 1 week.   Specialty:  Internal Medicine   Why:  to check hemoglobin and sodium   Contact information:   [place] [id number] [phone]        The results of significant diagnostics from this hospitalization (including imaging, microbiology, ancillary and laboratory) are listed below for reference.    Significant Diagnostic Studies: Dg Chest 2 View  04/16/2015  CLINICAL DATA:  Cough and fever.  Recent fall. EXAM: CHEST  2 VIEW COMPARISON:  CT 03/09/2015 FINDINGS: The heart is enlarged. There is tortuosity  of the thoracic aorta. Mild bibasilar atelectasis or scarring, unchanged. No confluent airspace disease, pleural effusion or pneumothorax. Severe degenerative change in both shoulders. IMPRESSION: Stable cardiomegaly.  Unchanged bibasilar atelectasis or scarring. Electronically Signed   By: Jeb Levering M.D.   On: 04/16/2015 21:55   Dg Knee 1-2 Views Left  04/08/2015  CLINICAL DATA:  Evaluate known patellar fracture from fall 5 weeks ago EXAM: LEFT KNEE - 1-2 VIEW COMPARISON:  03/06/2015 FINDINGS: Changes of medial compartment hemiarthroplasty, stable. Lucency through the lower pole of the left patella compatible with fracture. There has been bony resorption along the fracture line with fracture line remaining evident. No visible callus formation currently. IMPRESSION: Bony resorption along the left patellar fracture with fracture line remaining evident. No visible callus formation. Electronically Signed   By: Rolm Baptise M.D.   On: 04/08/2015 19:26   Nm Gi Blood Loss  04/09/2015  CLINICAL DATA:  Hematochezia. Inpatient. Red blood in stool 1 week prior. EXAM: NUCLEAR MEDICINE GASTROINTESTINAL BLEEDING SCAN TECHNIQUE: Sequential abdominal images were obtained following intravenous administration of Tc-39m labeled red blood cells. RADIOPHARMACEUTICALS:  22.9 mCi Tc-20m in-vitro labeled red cells. COMPARISON:  None. FINDINGS: There is no abnormal tagged red blood cell activity within the abdomen or pelvis to suggest active GI bleeding. IMPRESSION: No scintigraphic evidence of active GI bleeding. Electronically Signed   By: Ilona Sorrel M.D.   On: 04/09/2015 13:57   Dg Chest Port 1 View  04/17/2015  CLINICAL DATA:  Cough and short of breath 3 days. EXAM: PORTABLE CHEST 1 VIEW COMPARISON:  04/16/2015 FINDINGS: Normal cardiac silhouette. Aorta is ectatic. There is chronic bronchitic markings centrally. No infiltrate or pneumothorax. IMPRESSION: Chronic bronchitic markings.  No acute findings. Electronically  Signed   By: Suzy Bouchard M.D.   On: 04/17/2015 17:02   Dg Shoulder Left  04/08/2015  CLINICAL DATA:  Proximal humeral fracture 6 weeks ago.  Pain. EXAM: LEFT SHOULDER - 2+ VIEW COMPARISON:  03/06/2015.  CT 03/07/2015. FINDINGS: Healing humeral neck fracture noted with callus formation and sclerosis about the fracture line. The fracture line remains partially evident. Advanced degenerative changes within the left shorter. IMPRESSION: Evidence of healing left humeral neck fracture. Fracture line remains evident. Advanced degenerative changes. Electronically Signed   By: Rolm Baptise M.D.   On: 04/08/2015 19:25    Microbiology: Recent Results (from the past 240 hour(s))  Urine culture     Status: None   Collection Time: 04/16/15 11:48 PM  Result Value Ref Range Status   Specimen Description URINE, CLEAN CATCH  Final   Special Requests Normal  Final   Culture MULTIPLE SPECIES PRESENT, SUGGEST RECOLLECTION  Final   Report Status 04/18/2015 FINAL  Final  Respiratory virus panel     Status: Abnormal   Collection Time: 04/17/15  2:00 AM  Result Value Ref Range Status   Respiratory Syncytial Virus A Negative Negative Final   Respiratory Syncytial Virus B Negative Negative Final   Influenza A Positive (A) Negative Final    Comment: Subtype: H3   Influenza B Negative Negative Final   Parainfluenza 1 Negative Negative Final   Parainfluenza 2 Negative Negative Final   Parainfluenza 3 Negative Negative Final   Metapneumovirus Negative Negative Final   Rhinovirus Negative Negative Final   Adenovirus Negative Negative Final    Comment: (NOTE) Performed At: [facility] [place], [place] [id number] Lindon Romp MD [id number]      Labs: Basic Metabolic Panel:  Recent Labs Lab 04/16/15 1949 04/17/15 1717 04/19/15 0359 04/20/15 0545 04/21/15 0230 04/21/15 1053  NA 139 134* 127* 126* 123* 127*  K 5.3* 4.1 3.9 4.0 4.1  --   CL 101 96* 92* 91* 88*  --    CO2 29 29 28 25 28   --   GLUCOSE 111* 109* 113* 91 104*  --   BUN 7 7 12 9 9   --   CREATININE 0.86 0.86 0.69 0.59 0.67  --   CALCIUM 8.8* 8.3* 7.9* 7.9* 7.8*  --    Liver Function Tests:  Recent Labs Lab 04/16/15 1949  AST 15  ALT 11*  ALKPHOS 94  BILITOT 0.3  PROT 6.9  ALBUMIN 3.1*   No results for input(s): LIPASE, AMYLASE in the last 168 hours. No results for input(s): AMMONIA in the last 168 hours. CBC:  Recent Labs Lab 04/16/15 1949 04/17/15 0240 04/18/15 0530 04/19/15 0359 04/20/15 0545 04/21/15 0230  WBC 9.3 7.2 5.6 4.3  --  4.7  HGB 8.3* 8.0* 7.6* 7.5* 7.5* 7.4*  HCT 26.0* 25.5* 24.4* 23.4* 22.6* 22.9*  MCV 93.9 92.7 91.4 90.3  --  88.4  PLT 505* 471* 440* 398  --  374   Cardiac Enzymes: No results for input(s): CKTOTAL, CKMB, CKMBINDEX, TROPONINI in the last 168 hours. BNP: BNP (last 3 results) No results for input(s): BNP in the last 8760 hours.  ProBNP (last 3 results) No results for input(s): PROBNP in the last 8760 hours.  CBG: No results for input(s): GLUCAP in the last 168 hours.     Signed:  Delfina Redwood MD [facility] 04/21/2015, 1:27 PM

## 2015-04-21 NOTE — Progress Notes (Signed)
Patient discharged back to her facility by EMS. Vitals signs were stable and her norm. Report was called and given to [place]. Discharge papers were given to EMS. Patient received 7.5 mg of Norco for pain upon discharge at 5:55pm.

## 2015-04-21 NOTE — Consult Note (Signed)
   [facility] Inpatient Consult   04/21/2015  Nazari Tourangeau Hodgens March 25, 1933 [id number]   Not Eligible: Thank you for this consult.   This patient is Not eligible for [facility] Care Management Services.   Reason:  Not a beneficiary currently attributed to one of the [place].  Membership roster was used to verify non- eligible status.  Patient states that she has changed from Medicare to [facility] on April 12, 2015.  Patient states she can getting a visiting nurse at discharge if the physicians order it. Explained with her Medicare plan changes she is not in the [place] Management network.  Will sign off at this time. Natividad Brood, RN BSN [facility] Liaison  [phone] business mobile phone

## 2015-04-21 NOTE — Care Management Important Message (Signed)
Important Message  Patient Details  Name: Lisa Mcclure MRN: [id number] Date of Birth: 18-Sep-1932   Medicare Important Message Given:  Yes    Maryclare Labrador, RN 04/21/2015, 1:32 PM

## 2015-04-21 NOTE — Progress Notes (Signed)
UR Completed. Pearley Millington, RN, BSN.  [phone] 

## 2015-04-22 NOTE — Care Management Note (Signed)
Case Management Note  Patient Details  Name: Lisa Mcclure MRN: [id number] Date of Birth: December 04, 1932  Subjective/Objective:    Pt admitted with CAP                Action/Plan:  Pt is from [facility], plan is for pt to return there post discharge.  CSW consulted; informed CM that PT needs to evaluate pt prior to discharge.  PT evaluation ordered   Expected Discharge Date:                  Expected Discharge Plan:  Assisted Living / Rest Home ([facility])  In-House Referral:  Clinical Social Work  Discharge planning Services  CM Consult  Post Acute Care Choice:    Choice offered to:   ([place] )  DME Arranged:    DME Agency:     HH Arranged:  PT [place] Agency:   Secondary school teacher ([place] [facility] [place] [facility])  Status of Service:  In process, will continue to follow  Medicare Important Message Given:  Yes Date Medicare IM Given:    Medicare IM give by:    Date Additional Medicare IM Given:    Additional Medicare Important Message give by:     If discussed at [place] of Stay Meetings, dates discussed:    Additional Comments: 04/21/15  Pt discharged back to facility, CSW ensured [facility] order was in discharge packet  04/20/15 CM contacted [employer] ; assisted living side.  CM was informed that community will set up [facility] recommendations through their internal agency [facility].  CM will consult with CSW to provide [facility] order in discharge packet.  CM asked for [facility] orders during progression Maryclare Labrador, RN 04/22/2015, 8:47 AM

## 2015-04-28 DIAGNOSIS — D649 Anemia, unspecified: Secondary | ICD-10-CM | POA: Diagnosis not present

## 2015-04-28 DIAGNOSIS — K921 Melena: Secondary | ICD-10-CM | POA: Diagnosis not present

## 2015-05-11 ENCOUNTER — Encounter: Payer: Self-pay | Admitting: Physician Assistant

## 2015-05-11 ENCOUNTER — Ambulatory Visit (INDEPENDENT_AMBULATORY_CARE_PROVIDER_SITE_OTHER): Payer: Medicare HMO | Admitting: Physician Assistant

## 2015-05-11 VITALS — BP 140/90 | HR 64 | Ht 64.0 in | Wt 204.0 lb

## 2015-05-11 DIAGNOSIS — R0989 Other specified symptoms and signs involving the circulatory and respiratory systems: Secondary | ICD-10-CM | POA: Diagnosis not present

## 2015-05-11 DIAGNOSIS — I5032 Chronic diastolic (congestive) heart failure: Secondary | ICD-10-CM | POA: Diagnosis not present

## 2015-05-11 DIAGNOSIS — I10 Essential (primary) hypertension: Secondary | ICD-10-CM | POA: Diagnosis not present

## 2015-05-11 LAB — BASIC METABOLIC PANEL
BUN: 19 mg/dL (ref 7–25)
CALCIUM: 8.9 mg/dL (ref 8.6–10.4)
CO2: 27 mmol/L (ref 20–31)
Chloride: 99 mmol/L (ref 98–110)
Creat: 0.82 mg/dL (ref 0.60–0.88)
GLUCOSE: 91 mg/dL (ref 65–99)
POTASSIUM: 4.4 mmol/L (ref 3.5–5.3)
Sodium: 136 mmol/L (ref 135–146)

## 2015-05-11 LAB — CBC WITH DIFFERENTIAL/PLATELET
Basophils Absolute: 0.1 10*3/uL (ref 0.0–0.1)
Basophils Relative: 1 % (ref 0–1)
EOS PCT: 4 % (ref 0–5)
Eosinophils Absolute: 0.3 10*3/uL (ref 0.0–0.7)
HEMATOCRIT: 28.9 % — AB (ref 36.0–46.0)
HEMOGLOBIN: 8.9 g/dL — AB (ref 12.0–15.0)
LYMPHS PCT: 25 % (ref 12–46)
Lymphs Abs: 1.6 10*3/uL (ref 0.7–4.0)
MCH: 25.9 pg — AB (ref 26.0–34.0)
MCHC: 30.8 g/dL (ref 30.0–36.0)
MCV: 84.3 fL (ref 78.0–100.0)
MONO ABS: 0.8 10*3/uL (ref 0.1–1.0)
MONOS PCT: 13 % — AB (ref 3–12)
MPV: 8.7 fL (ref 8.6–12.4)
Neutro Abs: 3.6 10*3/uL (ref 1.7–7.7)
Neutrophils Relative %: 57 % (ref 43–77)
Platelets: 486 10*3/uL — ABNORMAL HIGH (ref 150–400)
RBC: 3.43 MIL/uL — AB (ref 3.87–5.11)
RDW: 16.8 % — AB (ref 11.5–15.5)
WBC: 6.3 10*3/uL (ref 4.0–10.5)

## 2015-05-11 MED ORDER — HYDRALAZINE HCL 50 MG PO TABS: 50.0000 mg | ORAL_TABLET | Freq: Two times a day (BID) | ORAL | Status: DC

## 2015-05-11 NOTE — Progress Notes (Signed)
Cardiology Office Note   Date:  05/11/2015   ID:  Lisa Mcclure, DOB 1932-06-13, MRN [id number]  PCP:  [name], MD  Cardiologist:  Dr. Meda Coffee   Chief Complaint: Swelling    History of Present Illness: Lisa Mcclure is a 82 y.o. female who presents for follow-up of CHF She saw Dr. Meda Coffee for the first time in 02/2015 with acute CHF and hypertension. Her Lasix was increased and echo ordered. This showed normal systolic function EF 0000000 with moderate LVH no wall motion abnormalities, grade 1 diastolic dysfunction and mild aortic stenosis. She never had follow-up because she fell twice and had a humeral fracture requiring surgery followed by patellar fracture 4 days later, hemorrhoidal bleeding requiring 2 units PRBC's and then the flu.  Through all this her Lasix was decreased to 20 mg daily. She is having more left leg pain from surgery and baker's cyst. She is nauseous from pain meds, fatigue, and has lost 12 pounds.  Past Medical History  Diagnosis Date  . Rheumatoid arthritis(714.0)   . Unspecified essential hypertension   . Esophageal reflux   . Hypothyroidism   . Diverticulosis   . Insomnia   . OSA (obstructive sleep apnea)   . OAB (overactive bladder)   . LBP (low back pain)   . DOE (dyspnea on exertion)   . Shoulder pain, bilateral   . GERD (gastroesophageal reflux disease)   . OAB (overactive bladder)   . Depression   . Venous insufficiency   . Cardiomegaly   . Osteoarthritis     Past Surgical History  Procedure Laterality Date  . Appendectomy  1953  . Vesicovaginal fistula closure w/ tah    . Breast biopsy Right 1980  . Knee arthroscopy w/ autogenous cartilage implantation (aci) procedure Left 1994, 1995  . Knee arthroscopy Right 1996, 2010  . Shoulder surgery  1990  . Cystocele repair    . Cesarean section      1957, 1961, 1964  . Wrist surgery  1967  . Total abdominal hysterectomy  1972  . Bladder abduction-1996  1996  . Cosmetic  surgery  1996  . Flexible sigmoidoscopy N/A 04/10/2015    Procedure: FLEXIBLE SIGMOIDOSCOPY;  Surgeon: Arta Silence, MD;  Location[facility] ENDOSCOPY;  Service: Endoscopy;  Laterality: N/A;     Current Outpatient Prescriptions  Medication Sig Dispense Refill  . acetaminophen (TYLENOL) 325 MG tablet Take 650 mg by mouth every 6 (six) hours as needed for mild pain.    Marland Kitchen aspirin 81 MG tablet Take 81 mg by mouth daily.    . busPIRone (BUSPAR) 7.5 MG tablet Take 1 tablet (7.5 mg total) by mouth 3 (three) times daily. 20 tablet 0  . cholecalciferol (VITAMIN D) 1000 UNITS tablet Take 1,000 Units by mouth daily.    . furosemide (LASIX) 20 MG tablet Take 1 tablet (20 mg total) by mouth daily. 30 tablet 1  . hydrALAZINE (APRESOLINE) 10 MG tablet Take 10 mg by mouth 2 (two) times daily. HOLD for SBP <110    . HYDROcodone-acetaminophen (NORCO) 7.5-325 MG tablet Take 1 tablet by mouth every 6 (six) hours as needed for moderate pain. 20 tablet 0  . iron polysaccharides (NIFEREX) 150 MG capsule Take 1 capsule (150 mg total) by mouth 2 (two) times daily. 60 capsule 1  . levothyroxine (SYNTHROID, LEVOTHROID) 100 MCG tablet Take 100 mcg by mouth daily before breakfast.    . losartan (COZAAR) 100 MG tablet Take 100 mg by mouth daily.    Marland Kitchen  metoprolol (LOPRESSOR) 50 MG tablet Take 50 mg by mouth 2 (two) times daily.    . Multiple Vitamin (MULTI-DAY VITAMINS PO) Take 1 tablet by mouth daily.     . mupirocin ointment (BACTROBAN) 2 % Place 1 application into the nose daily.    Marland Kitchen omeprazole (PRILOSEC) 20 MG capsule Take 20 mg by mouth 2 (two) times daily before a meal.     . polyethylene glycol (MIRALAX / GLYCOLAX) packet Take 17 g by mouth daily as needed.     . potassium chloride SA (K-DUR,KLOR-CON) 20 MEQ tablet Take 1 tablet (20 mEq total) by mouth daily. 90 tablet 3  . sennosides-docusate sodium (SENOKOT-S) 8.6-50 MG tablet Take 1 tablet by mouth daily. (Patient taking differently: Take 1 tablet by mouth daily as  needed. )    . traZODone (DESYREL) 50 MG tablet Take 1 tablet (50 mg total) by mouth at bedtime. For sleep 30 tablet 0  . benzonatate (TESSALON PERLES) 100 MG capsule Take 100 mg by mouth 3 (three) times daily as needed for cough. Reported on 05/11/2015    . guaiFENesin-dextromethorphan (ROBITUSSIN DM) 100-10 MG/5ML syrup Take 5 mLs by mouth every 4 (four) hours as needed for cough. (Patient not taking: Reported on 05/11/2015) 118 mL 0   No current facility-administered medications for this visit.    Allergies:   Review of patient's allergies indicates no known allergies.    Social History:  The patient  reports that she quit smoking about 31 years ago. Her smoking use included Cigarettes. She quit after 10 years of use. She does not have any smokeless tobacco history on file. She reports that she drinks alcohol. She reports that she does not use illicit drugs.   Family History:  The patient's  family history includes Breast cancer in her maternal aunt; COPD in her father; Lung cancer in her mother.    ROS:  Please see the history of present illness.   Otherwise, review of systems are positive for none.   All other systems are reviewed and negative.    PHYSICAL EXAM: VS:  BP 140/90 mmHg  Pulse 64  Ht 5\' 4"  (1.626 m)  Wt 204 lb (92.534 kg)  BMI 35.00 kg/m2 , BMI Body mass index is 35 kg/(m^2). GEN: Well nourished, well developed, in no acute distress Neck: Bilateral carotid bruits right greater than left no JVD, HJR,  or masses Cardiac: RRR; soft 1/6 systolic murmur at the left sternal border, no gallop, rubs, thrill or heave,  Respiratory:  clear to auscultation bilaterally, normal work of breathing GI: soft, nontender, nondistended, + BS MS: no deformity or atrophy Extremities: +1 edema bilaterally without cyanosis, clubbing,  good distal pulses bilaterally.  Skin: warm and dry, no rash Neuro:  Strength and sensation are intact    EKG:  EKG is not ordered today.  Recent  Labs: 04/16/2015: ALT 11* 04/20/2015: TSH 2.373 04/21/2015: BUN 9; Creatinine, Ser 0.67; Hemoglobin 7.4*; Platelets 374; Potassium 4.1; Sodium 127*    Lipid Panel    Component Value Date/Time   CHOL 226* 12/19/2006 0916   TRIG 132 12/19/2006 0916   HDL 43.9 12/19/2006 0916   CHOLHDL 5.1 CALC 12/19/2006 0916   VLDL 26 12/19/2006 0916   LDLDIRECT 159.2 12/19/2006 0916      Wt Readings from Last 3 Encounters:  05/11/15 204 lb (92.534 kg)  04/17/15 216 lb 11.4 oz (98.3 kg)  04/10/15 210 lb (95.255 kg)      Other studies Reviewed: Additional studies/  records that were reviewed today include and review of the records demonstrates: 2Decho 02/2015 Study Conclusions  - Procedure narrative: Transthoracic echocardiography. Image   quality was adequate. The study was technically difficult. - Left ventricle: The cavity size was normal. Wall thickness was   increased in a pattern of moderate LVH. Systolic function was   normal. The estimated ejection fraction was in the range of 55%   to 60%. Wall motion was normal; there were no regional wall   motion abnormalities. Doppler parameters are consistent with   abnormal left ventricular relaxation (grade 1 diastolic   dysfunction). - Aortic valve: There was very mild stenosis. Valve area (VTI):   1.34 cm^2. Valve area (Vmax): 1.51 cm^2. Valve area (Vmean): 1.3   cm^2. - Mitral valve: Calcified annulus. Mildly thickened leaflets .     ASSESSMENT AND PLAN:  Chronic diastolic heart failure, NYHA class 1 ([place]) Patient has some edema today. She has an elevated blood pressure and her Lasix was decreased throughout her recent surgeries. Increase Lasix to 40 mg daily for 2 days then back to 20 mg daily increased potassium to 40 mEq for 2 days then back to 20 mEq daily. Increase hydralazine to 50 mg twice a day. We will check blood work today. Patient's asking for a CBC because her last hemoglobin after the transfusions was 9 something. Her iron  was stopped because of nausea and severe black stools according to the patient. Follow-up with Dr. Meda Coffee in 1-2 months  Hypertension Blood pressure is elevated today. Increase hydralazine to 50 mg twice a day  Bilateral carotid bruits Check carotid Dopplers    Signed, Ermalinda Barrios, PA-C  05/11/2015 11:00 AM    [facility] [place], [place],   [address] Phone: [phone]; Fax: [phone]

## 2015-05-11 NOTE — Assessment & Plan Note (Signed)
Blood pressure is elevated today. Increase hydralazine to 50 mg twice a day

## 2015-05-11 NOTE — Assessment & Plan Note (Signed)
Check carotid Dopplers °

## 2015-05-11 NOTE — Assessment & Plan Note (Signed)
Patient has some edema today. She has an elevated blood pressure and her Lasix was decreased throughout her recent surgeries. Increase Lasix to 40 mg daily for 2 days then back to 20 mg daily increased potassium to 40 mEq for 2 days then back to 20 mEq daily. Increase hydralazine to 50 mg twice a day. We will check blood work today. Patient's asking for a CBC because her last hemoglobin after the transfusions was 9 something. Her iron was stopped because of nausea and severe black stools according to the patient. Follow-up with Dr. Meda Coffee in 1-2 months

## 2015-05-11 NOTE — Patient Instructions (Addendum)
Medication Instructions:  Your physician has recommended you make the following change in your medication:  1) INCREASE Hydralazine to 50mg  twice daily 2) INCREASE Lasix to 40mg  daily for 2 day then resume 20mg  daily 3) INCREASE Potassium to 49meq daily for 2 days then resume 42meq daily   Labwork: Bmet and Cbc today  Testing/Procedures: Your physician has requested that you have a carotid duplex. This test is an ultrasound of the carotid arteries in your neck. It looks at blood flow through these arteries that supply the brain with blood. Allow one hour for this exam. There are no restrictions or special instructions.    Follow-Up: Your physician recommends that you schedule a follow-up appointment in: 2 months with Dr.Nelson   Any Other Special Instructions Will Be Listed Below (If Applicable).     If you need a refill on your cardiac medications before your next appointment, please call your pharmacy.

## 2015-05-13 ENCOUNTER — Encounter: Payer: Self-pay | Admitting: Podiatry

## 2015-05-13 ENCOUNTER — Ambulatory Visit (INDEPENDENT_AMBULATORY_CARE_PROVIDER_SITE_OTHER): Payer: Medicare HMO | Admitting: Podiatry

## 2015-05-13 VITALS — BP 154/73 | HR 73 | Resp 16

## 2015-05-13 DIAGNOSIS — M79604 Pain in right leg: Secondary | ICD-10-CM

## 2015-05-13 DIAGNOSIS — B351 Tinea unguium: Secondary | ICD-10-CM

## 2015-05-13 DIAGNOSIS — M79674 Pain in right toe(s): Secondary | ICD-10-CM

## 2015-05-13 DIAGNOSIS — M79675 Pain in left toe(s): Secondary | ICD-10-CM

## 2015-05-13 DIAGNOSIS — M79605 Pain in left leg: Secondary | ICD-10-CM

## 2015-05-13 NOTE — Progress Notes (Signed)
Subjective:     [name], female   DOB: 1932-04-15, 82 y.o.   MRN: [id number]  HPI patient presents with nail disease 1-5 both feet with having had the hallux nail right removed with crusted tissue formation. States they're painful and she cannot cut them   Review of Systems     Objective:   Physical Exam  neurovascular status intact with thick yellow brittle nailbeds 1 through 5 left 2 through 5 right with crusted tissue hallux right with all them being painful when palpated    Assessment:      mycotic nail infection with pain bilateral    Plan:      reviewed condition and debris did all nailbeds and went ahead and smoothed. Patient will be seen back when those are returned for reoccurrence of service

## 2015-05-15 ENCOUNTER — Ambulatory Visit (HOSPITAL_COMMUNITY)
Admission: RE | Admit: 2015-05-15 | Discharge: 2015-05-15 | Disposition: A | Discharge status: Home / Self Care | Payer: Medicare HMO | Source: Ambulatory Visit | Attending: Physician Assistant | Admitting: Physician Assistant

## 2015-05-15 DIAGNOSIS — R0989 Other specified symptoms and signs involving the circulatory and respiratory systems: Secondary | ICD-10-CM | POA: Diagnosis not present

## 2015-05-15 DIAGNOSIS — I6523 Occlusion and stenosis of bilateral carotid arteries: Secondary | ICD-10-CM | POA: Diagnosis not present

## 2015-05-15 DIAGNOSIS — I10 Essential (primary) hypertension: Secondary | ICD-10-CM | POA: Insufficient documentation

## 2015-05-20 DIAGNOSIS — S82025D Nondisplaced longitudinal fracture of left patella, subsequent encounter for closed fracture with routine healing: Secondary | ICD-10-CM | POA: Diagnosis not present

## 2015-05-20 DIAGNOSIS — S42295D Other nondisplaced fracture of upper end of left humerus, subsequent encounter for fracture with routine healing: Secondary | ICD-10-CM | POA: Diagnosis not present

## 2015-05-21 DIAGNOSIS — S82025D Nondisplaced longitudinal fracture of left patella, subsequent encounter for closed fracture with routine healing: Secondary | ICD-10-CM | POA: Diagnosis not present

## 2015-06-01 DIAGNOSIS — G4700 Insomnia, unspecified: Secondary | ICD-10-CM | POA: Diagnosis not present

## 2015-06-03 DIAGNOSIS — G4700 Insomnia, unspecified: Secondary | ICD-10-CM | POA: Diagnosis not present

## 2015-06-04 DIAGNOSIS — H35363 Drusen (degenerative) of macula, bilateral: Secondary | ICD-10-CM | POA: Diagnosis not present

## 2015-06-26 ENCOUNTER — Other Ambulatory Visit (HOSPITAL_COMMUNITY): Payer: Self-pay

## 2015-06-29 ENCOUNTER — Encounter (HOSPITAL_COMMUNITY)
Admission: RE | Admit: 2015-06-29 | Discharge: 2015-06-29 | Disposition: A | Discharge status: Home / Self Care | Payer: Medicare HMO | Source: Ambulatory Visit | Attending: Internal Medicine | Admitting: Internal Medicine

## 2015-06-29 DIAGNOSIS — D649 Anemia, unspecified: Secondary | ICD-10-CM | POA: Diagnosis not present

## 2015-06-29 MED ORDER — SODIUM CHLORIDE 0.9 % IV SOLN
510.0000 mg | INTRAVENOUS | Status: DC
  Administered 2015-06-29: 510 mg via INTRAVENOUS
  Filled 2015-06-29: qty 17

## 2015-07-03 ENCOUNTER — Other Ambulatory Visit (HOSPITAL_COMMUNITY): Payer: Self-pay | Admitting: *Deleted

## 2015-07-06 ENCOUNTER — Ambulatory Visit (HOSPITAL_COMMUNITY)
Admission: RE | Admit: 2015-07-06 | Discharge: 2015-07-06 | Disposition: A | Discharge status: Home / Self Care | Payer: Medicare HMO | Source: Ambulatory Visit | Attending: Internal Medicine | Admitting: Internal Medicine

## 2015-07-06 DIAGNOSIS — D649 Anemia, unspecified: Secondary | ICD-10-CM | POA: Diagnosis present

## 2015-07-06 MED ORDER — SODIUM CHLORIDE 0.9 % IV SOLN
510.0000 mg | INTRAVENOUS | Status: DC
  Administered 2015-07-06: 510 mg via INTRAVENOUS
  Filled 2015-07-06: qty 17

## 2015-07-08 ENCOUNTER — Ambulatory Visit (INDEPENDENT_AMBULATORY_CARE_PROVIDER_SITE_OTHER): Payer: Medicare HMO | Admitting: Cardiology

## 2015-07-08 ENCOUNTER — Encounter: Payer: Self-pay | Admitting: Cardiology

## 2015-07-08 VITALS — BP 122/80 | HR 58 | Ht 63.5 in | Wt 203.8 lb

## 2015-07-08 DIAGNOSIS — I350 Nonrheumatic aortic (valve) stenosis: Secondary | ICD-10-CM | POA: Diagnosis not present

## 2015-07-08 DIAGNOSIS — I5033 Acute on chronic diastolic (congestive) heart failure: Secondary | ICD-10-CM | POA: Diagnosis not present

## 2015-07-08 DIAGNOSIS — I110 Hypertensive heart disease with heart failure: Secondary | ICD-10-CM | POA: Diagnosis not present

## 2015-07-08 DIAGNOSIS — R0609 Other forms of dyspnea: Secondary | ICD-10-CM | POA: Diagnosis not present

## 2015-07-08 DIAGNOSIS — R0600 Dyspnea, unspecified: Secondary | ICD-10-CM

## 2015-07-08 MED ORDER — FUROSEMIDE 20 MG PO TABS: 20.0000 mg | ORAL_TABLET | Freq: Every day | ORAL | Status: DC

## 2015-07-08 MED ORDER — DIGOXIN 125 MCG PO TABS: 0.1250 mg | ORAL_TABLET | Freq: Every day | ORAL | Status: DC

## 2015-07-08 MED ORDER — SPIRONOLACTONE 25 MG PO TABS: 25.0000 mg | ORAL_TABLET | Freq: Every day | ORAL | Status: DC

## 2015-07-08 NOTE — Progress Notes (Signed)
[name], female   DOB: 1932/08/21, 82 y.o.   MRN: [id number]     Cardiology Office Note   Date:  07/08/2015   ID:  Lisa Mcclure, DOB 02/21/1933, MRN [id number]  PCP:  [name], MD  Cardiologist:  Dr. Meda Coffee   Chief Complaint: SOB, LE edema   History of Present Illness: Lisa Mcclure is a 82 y.o. female who presents for follow-up of CHF She saw Dr. Meda Coffee for the first time in 02/2015 with acute CHF and hypertension. Her Lasix was increased and echo ordered. This showed normal systolic function EF 0000000 with moderate LVH no wall motion abnormalities, grade 1 diastolic dysfunction and mild aortic stenosis. She never had follow-up because she fell twice and had a humeral fracture requiring surgery followed by patellar fracture 4 days later, hemorrhoidal bleeding requiring 2 units PRBC's and then the flu.  07/08/2015 - the patient also had flu pneumonia and is being treated for anemia by infused Fe. She doesn't like to take lasix as it forces her to pee a lot, she stays active, but complains about SOB. She has occassional orthopnea.Denies palpitations or syncope, her falls were results of mechanical accident when she troppied on a carpet.    Past Medical History  Diagnosis Date  . Rheumatoid arthritis(714.0)   . Unspecified essential hypertension   . Esophageal reflux   . Hypothyroidism   . Diverticulosis   . Insomnia   . OSA (obstructive sleep apnea)   . OAB (overactive bladder)   . LBP (low back pain)   . DOE (dyspnea on exertion)   . Shoulder pain, bilateral   . GERD (gastroesophageal reflux disease)   . OAB (overactive bladder)   . Depression   . Venous insufficiency   . Cardiomegaly   . Osteoarthritis     Past Surgical History  Procedure Laterality Date  . Appendectomy  1953  . Vesicovaginal fistula closure w/ tah    . Breast biopsy Right 1980  . Knee arthroscopy w/ autogenous cartilage implantation (aci) procedure Left 1994, 1995  .  Knee arthroscopy Right 1996, 2010  . Shoulder surgery  1990  . Cystocele repair    . Cesarean section      1957, 1961, 1964  . Wrist surgery  1967  . Total abdominal hysterectomy  1972  . Bladder abduction-1996  1996  . Cosmetic surgery  1996  . Flexible sigmoidoscopy N/A 04/10/2015    Procedure: FLEXIBLE SIGMOIDOSCOPY;  Surgeon: Arta Silence, MD;  Location: [facility] ENDOSCOPY;  Service: Endoscopy;  Laterality: N/A;     Current Outpatient Prescriptions  Medication Sig Dispense Refill  . acetaminophen (TYLENOL) 325 MG tablet Take 650 mg by mouth every 6 (six) hours as needed for mild pain.    Marland Kitchen aspirin 81 MG tablet Take 81 mg by mouth daily.    . busPIRone (BUSPAR) 7.5 MG tablet Take 1 tablet (7.5 mg total) by mouth 3 (three) times daily. 20 tablet 0  . cholecalciferol (VITAMIN D) 1000 UNITS tablet Take 1,000 Units by mouth daily.    . hydrALAZINE (APRESOLINE) 50 MG tablet Take 1 tablet (50 mg total) by mouth 2 (two) times daily. HOLD for SBP <110 60 tablet 5  . HYDROcodone-acetaminophen (NORCO) 7.5-325 MG tablet Take 1 tablet by mouth every 6 (six) hours as needed for moderate pain. 20 tablet 0  . iron polysaccharides (NIFEREX) 150 MG capsule Take 1 capsule (150 mg total) by mouth 2 (two) times daily. 60 capsule 1  . levothyroxine (SYNTHROID,  LEVOTHROID) 100 MCG tablet Take 100 mcg by mouth daily before breakfast.    . losartan (COZAAR) 100 MG tablet Take 100 mg by mouth daily.    . metoprolol (LOPRESSOR) 50 MG tablet Take 50 mg by mouth 2 (two) times daily.    . Multiple Vitamin (MULTI-DAY VITAMINS PO) Take 1 tablet by mouth daily.     . mupirocin ointment (BACTROBAN) 2 % Place 1 application into the nose daily.    Marland Kitchen omeprazole (PRILOSEC) 20 MG capsule Take 20 mg by mouth 2 (two) times daily before a meal.     . polyethylene glycol (MIRALAX / GLYCOLAX) packet Take 17 g by mouth daily as needed.     . potassium chloride SA (K-DUR,KLOR-CON) 20 MEQ tablet Take 1 tablet (20 mEq total) by mouth  daily. 90 tablet 3  . rOPINIRole (REQUIP) 1 MG tablet TAKE 1 TABLET BY MOUTH EVERY EVENING FOR RESTLESS LEGS  12  . sennosides-docusate sodium (SENOKOT-S) 8.6-50 MG tablet Take 1 tablet by mouth daily. (Patient taking differently: Take 1 tablet by mouth daily as needed. )    . traMADol (ULTRAM) 50 MG tablet TAKE 1 TABLET TWICE A DAY AS NEEDED FOR PAIN  3  . traZODone (DESYREL) 50 MG tablet Take 1 tablet (50 mg total) by mouth at bedtime. For sleep 30 tablet 0   No current facility-administered medications for this visit.    Allergies:   Review of patient's allergies indicates no known allergies.    Social History:  The patient  reports that she quit smoking about 31 years ago. Her smoking use included Cigarettes. She quit after 10 years of use. She does not have any smokeless tobacco history on file. She reports that she drinks alcohol. She reports that she does not use illicit drugs.   Family History:  The patient's  family history includes Breast cancer in her maternal aunt; COPD in her father; Lung cancer in her mother.   ROS:  Please see the history of present illness.   Otherwise, review of systems are positive for none.   All other systems are reviewed and negative.   PHYSICAL EXAM: VS:  BP 122/80 mmHg  Pulse 58  Ht 5' 3.5" (1.613 m)  Wt 203 lb 12.8 oz (92.443 kg)  BMI 35.53 kg/m2 , BMI Body mass index is 35.53 kg/(m^2). GEN: Well nourished, well developed, in no acute distress Neck: Bilateral carotid bruits right greater than left no JVD, HJR,  or masses Cardiac: RRR; soft 1/6 systolic murmur at the left sternal border, no gallop, rubs, thrill or heave,  Respiratory:  clear to auscultation bilaterally, normal work of breathing GI: soft, nontender, nondistended, + BS MS: no deformity or atrophy Extremities: +1 edema bilaterally without cyanosis, clubbing,  good distal pulses bilaterally.  Skin: warm and dry, no rash Neuro:  Strength and sensation are intact  EKG:  03/09/2015 -  NSR, normal ECG.  Recent Labs: 04/16/2015: ALT 11* 04/20/2015: TSH 2.373 05/11/2015: BUN 19; Creat 0.82; Hemoglobin 8.9*; Platelets 486*; Potassium 4.4; Sodium 136   Lipid Panel    Component Value Date/Time   CHOL 226* 12/19/2006 0916   TRIG 132 12/19/2006 0916   HDL 43.9 12/19/2006 0916   CHOLHDL 5.1 CALC 12/19/2006 0916   VLDL 26 12/19/2006 0916   LDLDIRECT 159.2 12/19/2006 0916   Wt Readings from Last 3 Encounters:  07/08/15 203 lb 12.8 oz (92.443 kg)  06/29/15 200 lb (90.719 kg)  05/11/15 204 lb (92.534 kg)  Other studies Reviewed: Additional studies/ records that were reviewed today include and review of the records demonstrates: 2Decho 02/2015 Study Conclusions  - Procedure narrative: Transthoracic echocardiography. Image   quality was adequate. The study was technically difficult. - Left ventricle: The cavity size was normal. Wall thickness was   increased in a pattern of moderate LVH. Systolic function was   normal. The estimated ejection fraction was in the range of 55%   to 60%. Wall motion was normal; there were no regional wall   motion abnormalities. Doppler parameters are consistent with   abnormal left ventricular relaxation (grade 1 diastolic   dysfunction). - Aortic valve: There was very mild stenosis. Valve area (VTI):   1.34 cm^2. Valve area (Vmax): 1.51 cm^2. Valve area (Vmean): 1.3   cm^2. - Mitral valve: Calcified annulus. Mildly thickened leaflets .     ASSESSMENT AND PLAN:  1. Acute on chronic diastolic heart failure, NYHA class IIb, worsening lower extremity edema and mild crackles on her lungs she is advised to take an extra Lasix in the afternoon she is very unhappy about that as she feels like she will be restrained to bathroom all day. I will add spironolactone 25 mg by mouth daily and she is advised to take an extra Lasix on days when her lower extremity edema is worsening.  2. Dyspnea on exertion - this is multifactorial there is component  of diastolic heart failure however she also has profound anemia with hemoglobin as low as 7.8 there was increased to 8.7, she's being followed by Dr. Sharlett Iles who arranged for iron infusion. Her baseline EKG is completely normal and she has normal LVEF with no regional wall motion abnormalities I would and perform stress test before she is treating treated for heart failure and her anemia is corrected is if her symptoms persist after that I would opt for stress test.  3. Hypertensive heart disease with heart failure - now controlled.  4. Bilateral carotid bruits - these must be just because of aortic stenosis her bilateral cardiac ultrasound showed no significant stenosis.  5. Mild aortic stenosis - we will follow  Follow up in 2 months.   Signed, Dorothy Spark, MD  07/08/2015 1:28 PM    [facility] [place], [place], [place]  [address] Phone: [phone]; Fax: [phone]

## 2015-07-08 NOTE — Patient Instructions (Addendum)
Your physician has recommended you make the following change in your medication:  1.) start spironolactone 25 mg once a day  Your physician wants you to follow-up in: 2 months with Dr. Meda Coffee.  You will receive a reminder letter in the mail two months in advance. If you don't receive a letter, please call our office to schedule the follow-up appointment.

## 2015-08-31 DIAGNOSIS — G2581 Restless legs syndrome: Secondary | ICD-10-CM | POA: Diagnosis not present

## 2015-08-31 DIAGNOSIS — R0609 Other forms of dyspnea: Secondary | ICD-10-CM | POA: Diagnosis not present

## 2015-08-31 DIAGNOSIS — I10 Essential (primary) hypertension: Secondary | ICD-10-CM | POA: Diagnosis not present

## 2015-08-31 DIAGNOSIS — R2689 Other abnormalities of gait and mobility: Secondary | ICD-10-CM | POA: Diagnosis not present

## 2015-08-31 DIAGNOSIS — D649 Anemia, unspecified: Secondary | ICD-10-CM | POA: Diagnosis not present

## 2015-08-31 DIAGNOSIS — Z6837 Body mass index (BMI) 37.0-37.9, adult: Secondary | ICD-10-CM | POA: Diagnosis not present

## 2015-09-29 ENCOUNTER — Ambulatory Visit (INDEPENDENT_AMBULATORY_CARE_PROVIDER_SITE_OTHER): Payer: Medicare Other | Admitting: Cardiology

## 2015-09-29 ENCOUNTER — Encounter: Payer: Self-pay | Admitting: Cardiology

## 2015-09-29 VITALS — BP 132/74 | HR 53 | Ht 63.5 in | Wt 213.0 lb

## 2015-09-29 DIAGNOSIS — I5033 Acute on chronic diastolic (congestive) heart failure: Secondary | ICD-10-CM | POA: Diagnosis not present

## 2015-09-29 DIAGNOSIS — I5032 Chronic diastolic (congestive) heart failure: Secondary | ICD-10-CM

## 2015-09-29 DIAGNOSIS — I10 Essential (primary) hypertension: Secondary | ICD-10-CM | POA: Diagnosis not present

## 2015-09-29 DIAGNOSIS — I110 Hypertensive heart disease with heart failure: Secondary | ICD-10-CM

## 2015-09-29 DIAGNOSIS — R0609 Other forms of dyspnea: Secondary | ICD-10-CM

## 2015-09-29 DIAGNOSIS — R0600 Dyspnea, unspecified: Secondary | ICD-10-CM

## 2015-09-29 DIAGNOSIS — I350 Nonrheumatic aortic (valve) stenosis: Secondary | ICD-10-CM

## 2015-09-29 MED ORDER — FUROSEMIDE 80 MG PO TABS: 80.0000 mg | ORAL_TABLET | Freq: Every day | ORAL | Status: DC

## 2015-09-29 MED ORDER — HYDRALAZINE HCL 25 MG PO TABS: 25.0000 mg | ORAL_TABLET | Freq: Three times a day (TID) | ORAL | Status: DC

## 2015-09-29 NOTE — Progress Notes (Signed)
[name], female   DOB: 1932/11/08, 82 y.o.   MRN: [id number]     Cardiology Office Note   Date:  09/29/2015   ID:  Lisa Mcclure, DOB 09/01/1932, MRN [id number]  PCP:  [name], MD  Cardiologist:  Dr. Meda Coffee   Chief Complaint: SOB, LE edema   History of Present Illness: Lisa Mcclure is a 82 y.o. female who presents for follow-up of CHF She saw Dr. Meda Coffee for the first time in 02/2015 with acute CHF and hypertension. Her Lasix was increased and echo ordered. This showed normal systolic function EF 0000000 with moderate LVH no wall motion abnormalities, grade 1 diastolic dysfunction and mild aortic stenosis. She never had follow-up because she fell twice and had a humeral fracture requiring surgery followed by patellar fracture 4 days later, hemorrhoidal bleeding requiring 2 units PRBC's and then the flu.  07/08/2015 - the patient also had flu pneumonia and is being treated for anemia by infused Fe. She doesn't like to take lasix as it forces her to pee a lot, she stays active, but complains about SOB. She has occassional orthopnea.Denies palpitations or syncope, her falls were results of mechanical accident when she troppied on a carpet.  09/29/2015, patient is coming after 3 months, she complains of lower extremity swelling and pain in her ankles her weight is up from 203 pounds to 213 pounds. She states that she has been taking Lasix 40 mg daily however is not on her medication list and she admitted in the past that she was not taking it. She denies any orthopnea or paroxysmal nocturnal dyspnea nor chest pain palpitations or syncope. He is asking if there is anything [place] and do other diuretics.    Past Medical History  Diagnosis Date  . Rheumatoid arthritis(714.0)   . Unspecified essential hypertension   . Esophageal reflux   . Hypothyroidism   . Diverticulosis   . Insomnia   . OSA (obstructive sleep apnea)   . OAB (overactive bladder)   . LBP (low  back pain)   . DOE (dyspnea on exertion)   . Shoulder pain, bilateral   . GERD (gastroesophageal reflux disease)   . OAB (overactive bladder)   . Depression   . Venous insufficiency   . Cardiomegaly   . Osteoarthritis     Past Surgical History  Procedure Laterality Date  . Appendectomy  1953  . Vesicovaginal fistula closure w/ tah    . Breast biopsy Right 1980  . Knee arthroscopy w/ autogenous cartilage implantation (aci) procedure Left 1994, 1995  . Knee arthroscopy Right 1996, 2010  . Shoulder surgery  1990  . Cystocele repair    . Cesarean section      1957, 1961, 1964  . Wrist surgery  1967  . Total abdominal hysterectomy  1972  . Bladder abduction-1996  1996  . Cosmetic surgery  1996  . Flexible sigmoidoscopy N/A 04/10/2015    Procedure: FLEXIBLE SIGMOIDOSCOPY;  Surgeon: Arta Silence, MD;  Location: [facility] ENDOSCOPY;  Service: Endoscopy;  Laterality: N/A;     Current Outpatient Prescriptions  Medication Sig Dispense Refill  . aspirin 81 MG tablet Take 81 mg by mouth daily.    . busPIRone (BUSPAR) 7.5 MG tablet Take 1 tablet (7.5 mg total) by mouth 3 (three) times daily. 20 tablet 0  . cholecalciferol (VITAMIN D) 1000 UNITS tablet Take 1,000 Units by mouth daily.    . hydrALAZINE (APRESOLINE) 50 MG tablet Take 1 tablet (50 mg total) by mouth  2 (two) times daily. HOLD for SBP <110 60 tablet 5  . HYDROcodone-acetaminophen (NORCO) 7.5-325 MG tablet Take 1 tablet by mouth every 6 (six) hours as needed for moderate pain. 20 tablet 0  . levothyroxine (SYNTHROID, LEVOTHROID) 100 MCG tablet Take 100 mcg by mouth daily before breakfast.    . losartan (COZAAR) 100 MG tablet Take 100 mg by mouth daily.    . metoprolol (LOPRESSOR) 50 MG tablet Take 50 mg by mouth 2 (two) times daily.    . Multiple Vitamin (MULTI-DAY VITAMINS PO) Take 1 tablet by mouth daily.     Marland Kitchen omeprazole (PRILOSEC) 20 MG capsule Take 20 mg by mouth 2 (two) times daily before a meal.     . polyethylene glycol  (MIRALAX / GLYCOLAX) packet Take 17 g by mouth daily as needed.     . potassium chloride SA (K-DUR,KLOR-CON) 20 MEQ tablet Take 1 tablet (20 mEq total) by mouth daily. 90 tablet 3  . rOPINIRole (REQUIP) 1 MG tablet TAKE 1 TABLET BY MOUTH EVERY EVENING FOR RESTLESS LEGS  12  . sennosides-docusate sodium (SENOKOT-S) 8.6-50 MG tablet Take 1 tablet by mouth daily. (Patient taking differently: Take 1 tablet by mouth daily as needed. )    . spironolactone (ALDACTONE) 25 MG tablet Take 1 tablet (25 mg total) by mouth daily. 90 tablet 3   No current facility-administered medications for this visit.    Allergies:   Review of patient's allergies indicates no known allergies.    Social History:  The patient  reports that she quit smoking about 31 years ago. Her smoking use included Cigarettes. She quit after 10 years of use. She does not have any smokeless tobacco history on file. She reports that she drinks alcohol. She reports that she does not use illicit drugs.   Family History:  The patient's  family history includes Breast cancer in her maternal aunt; COPD in her father; Lung cancer in her mother.   ROS:  Please see the history of present illness.   Otherwise, review of systems are positive for none.   All other systems are reviewed and negative.   PHYSICAL EXAM: VS:  BP 132/74 mmHg  Pulse 53  Ht 5' 3.5" (1.613 m)  Wt 213 lb (96.616 kg)  BMI 37.13 kg/m2  SpO2 93% , BMI Body mass index is 37.13 kg/(m^2). GEN: Well nourished, well developed, in no acute distress Neck: Bilateral carotid bruits right greater than left no JVD, HJR,  or masses Cardiac: RRR; soft 1/6 systolic murmur at the left sternal border, no gallop, rubs, thrill or heave,  Respiratory:  clear to auscultation bilaterally, normal work of breathing GI: soft, nontender, nondistended, + BS MS: no deformity or atrophy Extremities: +2 edema bilaterally without cyanosis to her midcalf, clubbing,  good distal pulses bilaterally.    Skin: warm and dry, no rash Neuro:  Strength and sensation are intact  EKG:  03/09/2015 - NSR, normal ECG.  Recent Labs: 02/19/2015: Brain Natriuretic Peptide 285.8* 04/16/2015: ALT 11* 04/20/2015: TSH 2.373 05/11/2015: BUN 19; Creat 0.82; Hemoglobin 8.9*; Platelets 486*; Potassium 4.4; Sodium 136   Lipid Panel    Component Value Date/Time   CHOL 226* 12/19/2006 0916   TRIG 132 12/19/2006 0916   HDL 43.9 12/19/2006 0916   CHOLHDL 5.1 CALC 12/19/2006 0916   VLDL 26 12/19/2006 0916   LDLDIRECT 159.2 12/19/2006 0916   Wt Readings from Last 3 Encounters:  09/29/15 213 lb (96.616 kg)  07/08/15 203 lb 12.8 oz (92.443  kg)  06/29/15 200 lb (90.719 kg)     Other studies Reviewed: Additional studies/ records that were reviewed today include and review of the records demonstrates: 2Decho 02/2015 Study Conclusions  - Procedure narrative: Transthoracic echocardiography. Image   quality was adequate. The study was technically difficult. - Left ventricle: The cavity size was normal. Wall thickness was   increased in a pattern of moderate LVH. Systolic function was   normal. The estimated ejection fraction was in the range of 55%   to 60%. Wall motion was normal; there were no regional wall   motion abnormalities. Doppler parameters are consistent with   abnormal left ventricular relaxation (grade 1 diastolic   dysfunction). - Aortic valve: There was very mild stenosis. Valve area (VTI):   1.34 cm^2. Valve area (Vmax): 1.51 cm^2. Valve area (Vmean): 1.3   cm^2. - Mitral valve: Calcified annulus. Mildly thickened leaflets .     ASSESSMENT AND PLAN:  1. Acute on chronic diastolic heart failure, NYHA class IIb, worsening lower extremity edema, I'm suspicious that this is secondary to medication noncompliance as she hasn't been taking Lasix in the past and is not on her medication list and noting her pharmacy, we will start Lasix 80 mg daily as she is refusing taking 40 twice a day as she  doesn't want to be limited by going to bathroom a lot. I will also cut down on her hydralazine from 50-->25 mg by mouth 3 times a day to see if reducing dose of vasodilator will help her lower extremity edema.   2. Dyspnea on exertion - this is multifactorial there is component of diastolic heart failure however she also has profound anemia with hemoglobin as low as 7.8 there was increased to 8.9, she's being followed by Dr. Sharlett Iles who arranged for iron infusion. Her baseline EKG is completely normal and she has normal LVEF with no regional wall motion abnormalities I would and perform stress test before she is treating treated for heart failure and her anemia is corrected is if her symptoms persist after that I would opt for stress test.  3. Hypertensive heart disease with heart failure - now controlled.  4. Bilateral carotid bruits - these must be just because of aortic stenosis her bilateral cardiac ultrasound showed no significant stenosis.  5. Mild aortic stenosis - we will follow  Follow up in 2 weeks with a PA and blood work.   Signed, Ena Dawley, MD  09/29/2015 3:30 PM    [address], [place], [place]  [address] Phone: [phone]; Fax: [phone]

## 2015-09-29 NOTE — Patient Instructions (Signed)
Medication Instructions:   START TAKING LASIX (FUROSEMIDE) 80 MG ONCE DAILY  DECREASE YOUR HYDRALAZINE TO 25 MG THREE TIMES DAILY   Labwork:  IN 2 WEEKS, SAME DAY AS YOUR FOLLOW-UP APPOINTMENT WITH AN EXTENDER IN OUR OFFICE TO CHECK A BMET    You have been referred to CARDIAC REHAB PHASE 1    Follow-Up:  2 WEEKS WITH AN EXTENDER IN OUR OFFICE---PLEASE HAVE YOUR LAB APPOINTMENT SCHEDULED THE SAME DAY       If you need a refill on your cardiac medications before your next appointment, please call your pharmacy.

## 2015-10-19 ENCOUNTER — Ambulatory Visit: Payer: Medicare Other | Admitting: Physician Assistant

## 2015-10-19 ENCOUNTER — Other Ambulatory Visit: Payer: Medicare Other

## 2015-10-29 ENCOUNTER — Other Ambulatory Visit: Payer: Medicare Other

## 2015-10-29 ENCOUNTER — Ambulatory Visit: Payer: Medicare Other | Admitting: Physician Assistant

## 2015-11-04 DIAGNOSIS — Z6838 Body mass index (BMI) 38.0-38.9, adult: Secondary | ICD-10-CM | POA: Diagnosis not present

## 2015-11-04 DIAGNOSIS — J019 Acute sinusitis, unspecified: Secondary | ICD-10-CM | POA: Diagnosis not present

## 2015-11-04 DIAGNOSIS — R0600 Dyspnea, unspecified: Secondary | ICD-10-CM | POA: Diagnosis not present

## 2015-11-04 DIAGNOSIS — R05 Cough: Secondary | ICD-10-CM | POA: Diagnosis not present

## 2015-11-04 DIAGNOSIS — I5032 Chronic diastolic (congestive) heart failure: Secondary | ICD-10-CM | POA: Diagnosis not present

## 2015-11-10 DIAGNOSIS — Z6838 Body mass index (BMI) 38.0-38.9, adult: Secondary | ICD-10-CM | POA: Diagnosis not present

## 2015-11-10 DIAGNOSIS — I5032 Chronic diastolic (congestive) heart failure: Secondary | ICD-10-CM | POA: Diagnosis not present

## 2015-11-10 DIAGNOSIS — R05 Cough: Secondary | ICD-10-CM | POA: Diagnosis not present

## 2015-11-20 DIAGNOSIS — R05 Cough: Secondary | ICD-10-CM | POA: Diagnosis not present

## 2015-11-20 DIAGNOSIS — I5032 Chronic diastolic (congestive) heart failure: Secondary | ICD-10-CM | POA: Diagnosis not present

## 2015-11-20 DIAGNOSIS — Z6839 Body mass index (BMI) 39.0-39.9, adult: Secondary | ICD-10-CM | POA: Diagnosis not present

## 2015-12-07 DIAGNOSIS — R1030 Lower abdominal pain, unspecified: Secondary | ICD-10-CM | POA: Diagnosis not present

## 2015-12-07 DIAGNOSIS — K625 Hemorrhage of anus and rectum: Secondary | ICD-10-CM | POA: Diagnosis not present

## 2015-12-07 DIAGNOSIS — Z8719 Personal history of other diseases of the digestive system: Secondary | ICD-10-CM | POA: Diagnosis not present

## 2015-12-08 DIAGNOSIS — K625 Hemorrhage of anus and rectum: Secondary | ICD-10-CM | POA: Diagnosis not present

## 2015-12-15 ENCOUNTER — Encounter (HOSPITAL_COMMUNITY): Payer: Self-pay | Admitting: *Deleted

## 2015-12-15 DIAGNOSIS — Z7982 Long term (current) use of aspirin: Secondary | ICD-10-CM | POA: Diagnosis not present

## 2015-12-15 DIAGNOSIS — E039 Hypothyroidism, unspecified: Secondary | ICD-10-CM | POA: Diagnosis not present

## 2015-12-15 DIAGNOSIS — Z87891 Personal history of nicotine dependence: Secondary | ICD-10-CM | POA: Diagnosis not present

## 2015-12-15 DIAGNOSIS — I10 Essential (primary) hypertension: Secondary | ICD-10-CM | POA: Insufficient documentation

## 2015-12-15 DIAGNOSIS — Z79899 Other long term (current) drug therapy: Secondary | ICD-10-CM | POA: Diagnosis not present

## 2015-12-15 DIAGNOSIS — K625 Hemorrhage of anus and rectum: Secondary | ICD-10-CM | POA: Insufficient documentation

## 2015-12-15 LAB — CBC
HEMATOCRIT: 36.3 % (ref 36.0–46.0)
HEMOGLOBIN: 11.4 g/dL — AB (ref 12.0–15.0)
MCH: 30.2 pg (ref 26.0–34.0)
MCHC: 31.4 g/dL (ref 30.0–36.0)
MCV: 96.3 fL (ref 78.0–100.0)
PLATELETS: 358 10*3/uL (ref 150–400)
RBC: 3.77 MIL/uL — AB (ref 3.87–5.11)
RDW: 13.6 % (ref 11.5–15.5)
WBC: 8.8 10*3/uL (ref 4.0–10.5)

## 2015-12-15 LAB — COMPREHENSIVE METABOLIC PANEL
ALT: 11 U/L — ABNORMAL LOW (ref 14–54)
ANION GAP: 6 (ref 5–15)
AST: 15 U/L (ref 15–41)
Albumin: 3.4 g/dL — ABNORMAL LOW (ref 3.5–5.0)
Alkaline Phosphatase: 58 U/L (ref 38–126)
BUN: 13 mg/dL (ref 6–20)
CHLORIDE: 106 mmol/L (ref 101–111)
CO2: 26 mmol/L (ref 22–32)
Calcium: 8.6 mg/dL — ABNORMAL LOW (ref 8.9–10.3)
Creatinine, Ser: 0.81 mg/dL (ref 0.44–1.00)
Glucose, Bld: 89 mg/dL (ref 65–99)
POTASSIUM: 4 mmol/L (ref 3.5–5.1)
SODIUM: 138 mmol/L (ref 135–145)
Total Bilirubin: 0.2 mg/dL — ABNORMAL LOW (ref 0.3–1.2)
Total Protein: 6.1 g/dL — ABNORMAL LOW (ref 6.5–8.1)

## 2015-12-15 LAB — TYPE AND SCREEN
ABO/RH(D): B POS
Antibody Screen: NEGATIVE

## 2015-12-15 NOTE — ED Triage Notes (Signed)
Pt c/o burgundy colored rectal bleeding starting today. Pt denies lightheadedness, denies shortness of breath. Pt states her abdomin feels "crampy". Pt has hx of internal and external hemorrhoids.

## 2015-12-15 NOTE — ED Notes (Signed)
Per phone call from Dr. Cristina Gong pt sent here for 2 episodes of rectal bleeding.  Has dx of recurrent diverticulitis.  Last Hgb 12-08-15 12.2.

## 2015-12-16 ENCOUNTER — Emergency Department (HOSPITAL_COMMUNITY)
Admission: EM | Admit: 2015-12-16 | Discharge: 2015-12-16 | Disposition: A | Discharge status: Home / Self Care | Payer: Medicare Other | Attending: Emergency Medicine | Admitting: Emergency Medicine

## 2015-12-16 DIAGNOSIS — K625 Hemorrhage of anus and rectum: Secondary | ICD-10-CM | POA: Diagnosis not present

## 2015-12-16 DIAGNOSIS — I10 Essential (primary) hypertension: Secondary | ICD-10-CM

## 2015-12-16 LAB — POC OCCULT BLOOD, ED: Fecal Occult Bld: POSITIVE — AB

## 2015-12-16 MED ORDER — METOPROLOL TARTRATE 25 MG PO TABS
25.0000 mg | ORAL_TABLET | Freq: Once | ORAL | Status: AC
  Administered 2015-12-16: 25 mg via ORAL
  Filled 2015-12-16: qty 1

## 2015-12-16 MED ORDER — HYDROCODONE-ACETAMINOPHEN 7.5-325 MG PO TABS
1.0000 | ORAL_TABLET | Freq: Once | ORAL | Status: AC
  Administered 2015-12-16: 1 via ORAL
  Filled 2015-12-16: qty 1

## 2015-12-16 MED ORDER — HYDRALAZINE HCL 25 MG PO TABS
25.0000 mg | ORAL_TABLET | Freq: Once | ORAL | Status: AC
  Administered 2015-12-16: 25 mg via ORAL
  Filled 2015-12-16: qty 1

## 2015-12-16 NOTE — ED Provider Notes (Signed)
TIME SEEN: 12:58 AM  CHIEF COMPLAINT: Rectal Bleeding  HPI: Lisa Mcclure is a 82 y.o. female with a PMHx of diverticulosis, hemorrhoids who presents to the Emergency Department complaining of deep burgundy rectal bleeding x 2 episodes onset today. Pt reports that she has had three total episodes of rectal bleeding since 03/2015. Pt notes that her symptoms began with resolved abdominal cramping x 10 days ago. Pt states that she was seen by PA one week ago at Dr. Erlinda Hong office for rectal bleeding and abdominal cramping and was placed on a soft diet and her symptoms resolved 7 days ago. She notes that she has not tried any medications for the relief of her symptoms. She denies HA, CP, abdominal pain, vision change, numbness, weakness,dizziness, lightheadedness, and any other symptoms. Pt states that she is taking a low-dose ASA. No other antiplatelets or anticoagulation. States she's had her blood pressure medications this morning but not tonight. Complains of chronic right knee pain which she takes hydrocodone for it has not had her pain medication which she thinks is driving her blood pressure up. She has not had a bloody bowel movement since 4 PM yesterday.  ROS: See HPI Constitutional: no fever  Eyes: no drainage  ENT: no runny nose   Cardiovascular:  no chest pain  Resp: no SOB  GI: no vomiting GU: no dysuria Integumentary: no rash  Allergy: no hives  Musculoskeletal: no leg swelling  Neurological: no slurred speech ROS otherwise negative  PAST MEDICAL HISTORY/PAST SURGICAL HISTORY:  Past Medical History:  Diagnosis Date  . Cardiomegaly   . Depression   . Diverticulosis   . DOE (dyspnea on exertion)   . Esophageal reflux   . GERD (gastroesophageal reflux disease)   . Hypothyroidism   . Insomnia   . LBP (low back pain)   . OAB (overactive bladder)   . OAB (overactive bladder)   . OSA (obstructive sleep apnea)   . Osteoarthritis   . Rheumatoid arthritis(714.0)   . Shoulder  pain, bilateral   . Unspecified essential hypertension   . Venous insufficiency     MEDICATIONS:  Prior to Admission medications   Medication Sig Start Date End Date Taking? Authorizing Provider  aspirin 81 MG tablet Take 81 mg by mouth daily.    Historical Provider, MD  busPIRone (BUSPAR) 7.5 MG tablet Take 1 tablet (7.5 mg total) by mouth 3 (three) times daily. 04/13/15   Barton Dubois, MD  cholecalciferol (VITAMIN D) 1000 UNITS tablet Take 1,000 Units by mouth daily.    Historical Provider, MD  furosemide (LASIX) 80 MG tablet Take 1 tablet (80 mg total) by mouth daily. 09/29/15   Dorothy Spark, MD  hydrALAZINE (APRESOLINE) 25 MG tablet Take 1 tablet (25 mg total) by mouth 3 (three) times daily. 09/29/15   Dorothy Spark, MD  HYDROcodone-acetaminophen (NORCO) 7.5-325 MG tablet Take 1 tablet by mouth every 6 (six) hours as needed for moderate pain. 04/13/15   Barton Dubois, MD  levothyroxine (SYNTHROID, LEVOTHROID) 100 MCG tablet Take 100 mcg by mouth daily before breakfast.    Historical Provider, MD  losartan (COZAAR) 100 MG tablet Take 100 mg by mouth daily.    Historical Provider, MD  metoprolol (LOPRESSOR) 50 MG tablet Take 50 mg by mouth 2 (two) times daily.    Historical Provider, MD  Multiple Vitamin (MULTI-DAY VITAMINS PO) Take 1 tablet by mouth daily.     Historical Provider, MD  omeprazole (PRILOSEC) 20 MG capsule Take 20 mg by  mouth 2 (two) times daily before a meal.     Historical Provider, MD  polyethylene glycol (MIRALAX / GLYCOLAX) packet Take 17 g by mouth daily as needed.     Historical Provider, MD  potassium chloride SA (K-DUR,KLOR-CON) 20 MEQ tablet Take 1 tablet (20 mEq total) by mouth daily. 02/19/15   Dorothy Spark, MD  rOPINIRole (REQUIP) 1 MG tablet TAKE 1 TABLET BY MOUTH EVERY EVENING FOR RESTLESS LEGS 06/19/15   Historical Provider, MD  sennosides-docusate sodium (SENOKOT-S) 8.6-50 MG tablet Take 1 tablet by mouth daily. Patient taking differently: Take 1  tablet by mouth daily as needed.  04/13/15   Barton Dubois, MD  spironolactone (ALDACTONE) 25 MG tablet Take 1 tablet (25 mg total) by mouth daily. 07/08/15   Dorothy Spark, MD    ALLERGIES:  No Known Allergies  SOCIAL HISTORY:  Social History  Substance Use Topics  . Smoking status: Former Smoker    Years: 10.00    Types: Cigarettes    Quit date: 04/11/1984  . Smokeless tobacco: Never Used  . Alcohol use 0.0 oz/week     Comment: cocktail daily    FAMILY HISTORY: Family History  Problem Relation Age of Onset  . COPD Father   . Lung cancer Mother   . Breast cancer Maternal Aunt     EXAM: BP (!) 213/72   Pulse (!) 54   Temp 97.6 F (36.4 C) (Oral)   Resp 16   Ht 5\' 3"  (1.6 m)   Wt 205 lb (93 kg)   SpO2 98%   BMI 36.31 kg/m  CONSTITUTIONAL: Elderly. Non-toxic appearing. Alert and oriented and responds appropriately to questions. Well-appearing; well-nourished HEAD: Normocephalic EYES: Conjunctivae clear, PERRL ENT: normal nose; no rhinorrhea; moist mucous membranes NECK: Supple, no meningismus, no LAD  CARD: regular and bradycardic; S1 and S2 appreciated; no murmurs, no clicks, no rubs, no gallops RESP: Normal chest excursion without splinting or tachypnea; breath sounds clear and equal bilaterally; no wheezes, no rhonchi, no rales, no hypoxia or respiratory distress, speaking full sentences ABD/GI: Normal bowel sounds; non-distended; soft, non-tender, no rebound, no guarding, no peritoneal signs RECTAL:  Normal rectal tone, volume gross bright red blood on rectal exam, no melena, guaiac positive, no hemorrhoids appreciated, nontender rectal exam, no fecal impaction BACK:  The back appears normal and is non-tender to palpation, there is no CVA tenderness EXT: Tender to palpation over the right knee diffusely without joint effusion, erythema or warmth. Normal ROM in all joints; otherwise extremity is are non-tender to palpation; no edema; normal capillary refill; no  cyanosis, no calf tenderness or swelling    SKIN: Normal color for age and race; warm; no rash NEURO: Moves all extremities equally, sensation to light touch intact diffusely, cranial nerves II through XII intact PSYCH: The patient's mood and manner are appropriate. Grooming and personal hygiene are appropriate.  MEDICAL DECISION MAKING: Patient here with rectal bleeding. Has a history of diverticulosis and hemorrhoids. No active bleeding hemorrhoid at this time. May be a diverticular bleed Bacardi appears to be improving. Her hemoglobin is up from baseline and she denies any abdominal pain, chest pain, shortness of breath, lightheadedness.  Is on aspirin but no other anticoagulation or antiplatelet agent. We'll continue to monitor patient. She is hypertensive but suspect this is due to her chronic right knee pain and that she is not had her blood pressure medication tonight. We will give her a dose of her home pain medication and blood pressure  medication and reassess her blood pressure.  ED PROGRESS: 2:40 AM  Pt's blood pressure has improved. She has been able to eat. Still not having any more bloody bowel movements. I feel she is safe to be discharged with close follow-up with her GI physician Dr. Paulita Fujita. She is comfortable with this plan and would prefer discharge home. Patient and son state that if symptoms worsen, they will return immediately to the emergency department.  I personally performed the services described in this documentation, which was scribed in my presence. The recorded information has been reviewed and is accurate.     [place], DO 12/16/15 [id number]

## 2015-12-16 NOTE — ED Notes (Signed)
Pt provided with d/c instructions at this time. Pt verbalizes understanding of d/c instructions as well as follow up procedure after d/c.  No new RX at time of d/c. Pt in no apparent distress at this time. Pt assisted out of the ED via [place].

## 2015-12-18 DIAGNOSIS — I10 Essential (primary) hypertension: Secondary | ICD-10-CM | POA: Diagnosis not present

## 2015-12-18 DIAGNOSIS — R0609 Other forms of dyspnea: Secondary | ICD-10-CM | POA: Diagnosis not present

## 2015-12-18 DIAGNOSIS — K922 Gastrointestinal hemorrhage, unspecified: Secondary | ICD-10-CM | POA: Diagnosis not present

## 2015-12-18 DIAGNOSIS — K649 Unspecified hemorrhoids: Secondary | ICD-10-CM | POA: Diagnosis not present

## 2015-12-18 DIAGNOSIS — D6489 Other specified anemias: Secondary | ICD-10-CM | POA: Diagnosis not present

## 2015-12-18 DIAGNOSIS — K625 Hemorrhage of anus and rectum: Secondary | ICD-10-CM | POA: Diagnosis not present

## 2015-12-18 DIAGNOSIS — Z6838 Body mass index (BMI) 38.0-38.9, adult: Secondary | ICD-10-CM | POA: Diagnosis not present

## 2015-12-18 DIAGNOSIS — K5731 Diverticulosis of large intestine without perforation or abscess with bleeding: Secondary | ICD-10-CM | POA: Diagnosis not present

## 2015-12-18 DIAGNOSIS — I5032 Chronic diastolic (congestive) heart failure: Secondary | ICD-10-CM | POA: Diagnosis not present

## 2015-12-22 DIAGNOSIS — K648 Other hemorrhoids: Secondary | ICD-10-CM | POA: Diagnosis not present

## 2015-12-22 DIAGNOSIS — K5730 Diverticulosis of large intestine without perforation or abscess without bleeding: Secondary | ICD-10-CM | POA: Diagnosis not present

## 2015-12-22 DIAGNOSIS — K625 Hemorrhage of anus and rectum: Secondary | ICD-10-CM | POA: Diagnosis not present

## 2015-12-22 DIAGNOSIS — K6389 Other specified diseases of intestine: Secondary | ICD-10-CM | POA: Diagnosis not present

## 2015-12-22 DIAGNOSIS — D124 Benign neoplasm of descending colon: Secondary | ICD-10-CM | POA: Diagnosis not present

## 2015-12-22 DIAGNOSIS — D126 Benign neoplasm of colon, unspecified: Secondary | ICD-10-CM | POA: Diagnosis not present

## 2015-12-25 DIAGNOSIS — K649 Unspecified hemorrhoids: Secondary | ICD-10-CM | POA: Diagnosis not present

## 2015-12-25 DIAGNOSIS — K5730 Diverticulosis of large intestine without perforation or abscess without bleeding: Secondary | ICD-10-CM | POA: Diagnosis not present

## 2015-12-25 DIAGNOSIS — K625 Hemorrhage of anus and rectum: Secondary | ICD-10-CM | POA: Diagnosis not present

## 2015-12-29 DIAGNOSIS — D126 Benign neoplasm of colon, unspecified: Secondary | ICD-10-CM | POA: Diagnosis not present

## 2016-01-08 DIAGNOSIS — H353112 Nonexudative age-related macular degeneration, right eye, intermediate dry stage: Secondary | ICD-10-CM | POA: Diagnosis not present

## 2016-01-08 DIAGNOSIS — H353122 Nonexudative age-related macular degeneration, left eye, intermediate dry stage: Secondary | ICD-10-CM | POA: Diagnosis not present

## 2016-01-08 DIAGNOSIS — H3500 Unspecified background retinopathy: Secondary | ICD-10-CM | POA: Diagnosis not present

## 2016-01-08 DIAGNOSIS — H524 Presbyopia: Secondary | ICD-10-CM | POA: Diagnosis not present

## 2016-01-09 DIAGNOSIS — Z23 Encounter for immunization: Secondary | ICD-10-CM | POA: Diagnosis not present

## 2016-01-12 ENCOUNTER — Telehealth: Payer: Self-pay | Admitting: Cardiology

## 2016-01-12 NOTE — Telephone Encounter (Signed)
Pt c/o Shortness Of Breath: STAT if SOB developed within the last 24 hours or pt is noticeably SOB on the phone  1. Are you currently SOB (can you hear that pt is SOB on the phone)? No  2. How long have you been experiencing SOB?for some time   3. Are you SOB when sitting or when up moving around? Moving around   4. Are you currently experiencing any other symptoms? No

## 2016-01-12 NOTE — Telephone Encounter (Signed)
Pt calling with complaints of ongoing sob for weeks now.  Pt states she has had a multitude of issues occur within the last few months.  Pt states she has had bronchitis that took 3 weeks to kick, and she also had blood in her stool, which was followed and treated by Dr Paulita Fujita.  Pt states she has no chest pain, palpitations, dizziness, pre-syncopal or syncopal episodes.  Pt states she does have some edema in her lower extremities, but states "I always do." Pt states she is being compliant with taking all her meds prescribed, especially her lasix.  Pt states she has not been weighing herself daily, as a month ago she did.  Pt estimates her weight fluctuation between 212 lbs -217 lbs. Pt was able to speak in complete and full sentences over the phone.  Pt states she is not in any acute distress at this time, she just feels she needs to be seen by Dr Meda Coffee for this issue.  Scheduled the pt to see Dr Meda Coffee for this Thursday 01/14/16 at [place].  Pt agreed to this date and time.  Advised the pt to continue her current med regimen.  Advised the pt to limit her sodium intake, to avoid further retention. Encouraged  the pt to start weighing herself at the same time every morning, and log this information when she feels her symptoms are worsened. Advised the pt that if she becomes acutely distressed with increased sob, DOE, worsening in LEE, chest pain, dizziness, per-syncopal/syncopal episodes, or any other cardiac issues, she should seek immediate medical attention.  Pt verbalized understanding and agrees with this plan.  Will route this message to Dr Meda Coffee, to make her aware of pts complaints.

## 2016-01-14 ENCOUNTER — Ambulatory Visit (INDEPENDENT_AMBULATORY_CARE_PROVIDER_SITE_OTHER): Payer: Medicare Other | Admitting: Cardiology

## 2016-01-14 ENCOUNTER — Other Ambulatory Visit: Payer: Self-pay

## 2016-01-14 ENCOUNTER — Ambulatory Visit (HOSPITAL_COMMUNITY): Payer: Medicare Other | Attending: Cardiology

## 2016-01-14 VITALS — BP 120/62 | HR 54 | Ht 63.0 in | Wt 216.0 lb

## 2016-01-14 DIAGNOSIS — I5023 Acute on chronic systolic (congestive) heart failure: Secondary | ICD-10-CM | POA: Diagnosis not present

## 2016-01-14 DIAGNOSIS — I10 Essential (primary) hypertension: Secondary | ICD-10-CM | POA: Diagnosis not present

## 2016-01-14 DIAGNOSIS — I5032 Chronic diastolic (congestive) heart failure: Secondary | ICD-10-CM | POA: Insufficient documentation

## 2016-01-14 DIAGNOSIS — I110 Hypertensive heart disease with heart failure: Secondary | ICD-10-CM | POA: Insufficient documentation

## 2016-01-14 DIAGNOSIS — G4733 Obstructive sleep apnea (adult) (pediatric): Secondary | ICD-10-CM | POA: Insufficient documentation

## 2016-01-14 DIAGNOSIS — I350 Nonrheumatic aortic (valve) stenosis: Secondary | ICD-10-CM

## 2016-01-14 LAB — ECHOCARDIOGRAM COMPLETE
Height: 63 in
Weight: 3456 oz

## 2016-01-14 MED ORDER — FUROSEMIDE 80 MG PO TABS: 80.0000 mg | ORAL_TABLET | Freq: Every day | ORAL | 3 refills | Status: DC

## 2016-01-14 NOTE — Patient Instructions (Signed)
Medication Instructions:   CONTINUE TAKING YOUR LASIX 80 MG ONCE DAILY    Testing/Procedures:  Your physician has requested that you have an echocardiogram. Echocardiography is a painless test that uses sound waves to create images of your heart. It provides your doctor with information about the size and shape of your heart and how well your heart's chambers and valves are working. This procedure takes approximately one hour. There are no restrictions for this procedure.  PER DR NELSON, YOU WILL HAVE THIS DONE TODAY.    Follow-Up:  2 WEEKS WITH AN EXTENDER IN OUR OFFICE       If you need a refill on your cardiac medications before your next appointment, please call your pharmacy.

## 2016-01-14 NOTE — Progress Notes (Signed)
[name], female   DOB: 1932-06-07, 82 y.o.   MRN: [id number]     Cardiology Office Note   Date:  01/14/2016   ID:  Lisa Mcclure, DOB 1932/10/14, MRN [id number]  PCP:  [name], MD  Cardiologist:  Dr. Meda Coffee   Chief Complaint: SOB, LE edema   History of Present Illness: Lisa Mcclure is a 82 y.o. female who presents for follow-up of CHF She saw Dr. Meda Coffee for the first time in 02/2015 with acute CHF and hypertension. Her Lasix was increased and echo ordered. This showed normal systolic function EF 0000000 with moderate LVH no wall motion abnormalities, grade 1 diastolic dysfunction and mild aortic stenosis. She never had follow-up because she fell twice and had a humeral fracture requiring surgery followed by patellar fracture 4 days later, hemorrhoidal bleeding requiring 2 units PRBC's and then the flu.  07/08/2015 - the patient also had flu pneumonia and is being treated for anemia by infused Fe. She doesn't like to take lasix as it forces her to pee a lot, she stays active, but complains about SOB. She has occassional orthopnea.Denies palpitations or syncope, her falls were results of mechanical accident when she troppied on a carpet.  09/29/2015, patient is coming after 3 months, she complains of lower extremity swelling and pain in her ankles her weight is up from 203 pounds to 213 pounds. She states that she has been taking Lasix 40 mg daily however is not on her medication list and she admitted in the past that she was not taking it. She denies any orthopnea or paroxysmal nocturnal dyspnea nor chest pain palpitations or syncope. He is asking if there is anything [place] and do other diuretics.  01/14/2016 - the patient is coming after 3 months, in the meantime she has been experiencing worsening shortness of breath now addressed. She also feels chest tightness on exertion no dizziness or syncope or palpitations. She again hasn't been compliant to her Lasix  80 mg daily as she feels like those days she can't do anything she is limited to finding the bathroom. She went to a week ago and her blood pressure was 240 she was emotionally upset at the time now is well controlled.    Past Medical History:  Diagnosis Date  . Cardiomegaly   . Depression   . Diverticulosis   . DOE (dyspnea on exertion)   . Esophageal reflux   . GERD (gastroesophageal reflux disease)   . Hypothyroidism   . Insomnia   . LBP (low back pain)   . OAB (overactive bladder)   . OAB (overactive bladder)   . OSA (obstructive sleep apnea)   . Osteoarthritis   . Rheumatoid arthritis(714.0)   . Shoulder pain, bilateral   . Unspecified essential hypertension   . Venous insufficiency     Past Surgical History:  Procedure Laterality Date  . APPENDECTOMY  1953  . bladder abduction-1996  1996  . breast biopsy Right 1980  . [place]  . COSMETIC SURGERY  1996  . CYSTOCELE REPAIR    . FLEXIBLE SIGMOIDOSCOPY N/A 04/10/2015   Procedure: FLEXIBLE SIGMOIDOSCOPY;  Surgeon: Arta Silence, MD;  Location: [facility] ENDOSCOPY;  Service: Endoscopy;  Laterality: N/A;  . knee arthroscopy Right 1996, 2010  . KNEE ARTHROSCOPY W/ AUTOGENOUS CARTILAGE IMPLANTATION (ACI) PROCEDURE Left 1994, 1995  . SHOULDER SURGERY  1990  . TOTAL ABDOMINAL HYSTERECTOMY  1972  . VESICOVAGINAL FISTULA CLOSURE W/ TAH    .  WRIST SURGERY  1967     Current Outpatient Prescriptions  Medication Sig Dispense Refill  . busPIRone (BUSPAR) 7.5 MG tablet Take 1 tablet (7.5 mg total) by mouth 3 (three) times daily. 20 tablet 0  . cholecalciferol (VITAMIN D) 1000 UNITS tablet Take 1,000 Units by mouth daily.    . furosemide (LASIX) 80 MG tablet Take 1 tablet (80 mg total) by mouth daily. 30 tablet 1  . hydrALAZINE (APRESOLINE) 25 MG tablet Take 1 tablet (25 mg total) by mouth 3 (three) times daily. 270 tablet 3  . HYDROcodone-acetaminophen (NORCO) 7.5-325 MG tablet Take 1 tablet by mouth every  6 (six) hours as needed for moderate pain. 20 tablet 0  . levothyroxine (SYNTHROID, LEVOTHROID) 100 MCG tablet Take 100 mcg by mouth daily before breakfast.    . losartan (COZAAR) 100 MG tablet Take 100 mg by mouth daily.    . Melatonin 3 MG TABS Take by mouth.    . metoprolol (LOPRESSOR) 50 MG tablet Take 50 mg by mouth 2 (two) times daily.    . Multiple Vitamin (MULTI-DAY VITAMINS PO) Take 1 tablet by mouth daily.     . Multiple Vitamins-Minerals (VITEYES AREDS FORMULA PO) Take 4 capsules by mouth daily.    Marland Kitchen omeprazole (PRILOSEC) 20 MG capsule Take 20 mg by mouth 2 (two) times daily before a meal.     . polyethylene glycol (MIRALAX / GLYCOLAX) packet Take 17 g by mouth daily as needed.     . potassium chloride SA (K-DUR,KLOR-CON) 20 MEQ tablet Take 1 tablet (20 mEq total) by mouth daily. 90 tablet 3  . rOPINIRole (REQUIP) 1 MG tablet TAKE 1 TABLET BY MOUTH EVERY EVENING FOR RESTLESS LEGS  12  . sennosides-docusate sodium (SENOKOT-S) 8.6-50 MG tablet Take 1 tablet by mouth daily. (Patient taking differently: Take 1 tablet by mouth daily as needed. )    . spironolactone (ALDACTONE) 25 MG tablet Take 1 tablet (25 mg total) by mouth daily. 90 tablet 3   No current facility-administered medications for this visit.     Allergies:   Review of patient's allergies indicates no known allergies.    Social History:  The patient  reports that she quit smoking about 31 years ago. Her smoking use included Cigarettes. She quit after 10.00 years of use. She has never used smokeless tobacco. She reports that she drinks alcohol. She reports that she does not use drugs.   Family History:  The patient's  family history includes Breast cancer in her maternal aunt; COPD in her father; Lung cancer in her mother.   ROS:  Please see the history of present illness.   Otherwise, review of systems are positive for none.   All other systems are reviewed and negative.   PHYSICAL EXAM: VS:  BP 120/62   Pulse (!) 54    Ht 5\' 3"  (1.6 m)   Wt 216 lb (98 kg)   BMI 38.26 kg/m  , BMI Body mass index is 38.26 kg/m. GEN: Well nourished, well developed, in no acute distress  Neck: Bilateral carotid bruits right greater than left no JVD, HJR,  or masses Cardiac: RRR; loud 2 out of 6 holosystolic murmur at the left sternal border, no gallop, rubs, thrill or heave,  Respiratory:  Crackles bilaterally, normal work of breathing GI: soft, nontender, nondistended, + BS MS: no deformity or atrophy  Extremities: +2 edema bilaterally without cyanosis to her midcalf, clubbing,  good distal pulses bilaterally.  Skin: warm and dry, no  rash Neuro:  Strength and sensation are intact  EKG:  03/09/2015 - NSR, normal ECG.  Recent Labs: 02/19/2015: Brain Natriuretic Peptide 285.8 04/20/2015: TSH 2.373 12/15/2015: ALT 11; BUN 13; Creatinine, Ser 0.81; Hemoglobin 11.4; Platelets 358; Potassium 4.0; Sodium 138   Lipid Panel    Component Value Date/Time   CHOL 226 (HH) 12/19/2006 0916   TRIG 132 12/19/2006 0916   HDL 43.9 12/19/2006 0916   CHOLHDL 5.1 CALC 12/19/2006 0916   VLDL 26 12/19/2006 0916   LDLDIRECT 159.2 12/19/2006 0916   Wt Readings from Last 3 Encounters:  01/14/16 216 lb (98 kg)  12/15/15 205 lb (93 kg)  09/29/15 213 lb (96.6 kg)     Other studies Reviewed: Additional studies/ records that were reviewed today include and review of the records demonstrates: 2Decho 02/2015 Study Conclusions  - Procedure narrative: Transthoracic echocardiography. Image   quality was adequate. The study was technically difficult. - Left ventricle: The cavity size was normal. Wall thickness was   increased in a pattern of moderate LVH. Systolic function was   normal. The estimated ejection fraction was in the range of 55%   to 60%. Wall motion was normal; there were no regional wall   motion abnormalities. Doppler parameters are consistent with   abnormal left ventricular relaxation (grade 1 diastolic   dysfunction). -  Aortic valve: There was very mild stenosis. Valve area (VTI):   1.34 cm^2. Valve area (Vmax): 1.51 cm^2. Valve area (Vmean): 1.3   cm^2. - Mitral valve: Calcified annulus. Mildly thickened leaflets .    ASSESSMENT AND PLAN:  1. Acute on chronic diastolic heart failure, NYHA class III,  She has worsening lower extremity edema, she has gained 13 pounds in a last 6 months, she is crackles on her lungs, she is strongly advised to start using Lasix 80 daily and spironolactone, follow-up in 2 weeks, I believe this is related to her worsening aortic stenosis and her medication noncompliance will follow-up in 2 weeks with blood work at that time.  2. Aortic stenosis, mild on the echo in 2016 however now worsening murmur and worsening symptoms of heart failure, will repeat echocardiogram today.  3. Hypertensive heart disease with heart failure - now controlled.  4. Bilateral carotid bruits - these must be just because of aortic stenosis her bilateral cardiac ultrasound showed no significant stenosis.   Follow up in 2 weeks with a PA and blood work.   Signed, Ena Dawley, MD  01/14/2016 9:44 AM    [address] [place], [place], [place]  [address] Phone: [phone]; Fax: [phone]

## 2016-01-15 ENCOUNTER — Telehealth: Payer: Self-pay | Admitting: *Deleted

## 2016-01-15 DIAGNOSIS — I5032 Chronic diastolic (congestive) heart failure: Secondary | ICD-10-CM

## 2016-01-15 DIAGNOSIS — I350 Nonrheumatic aortic (valve) stenosis: Secondary | ICD-10-CM | POA: Insufficient documentation

## 2016-01-15 MED ORDER — METOLAZONE 2.5 MG PO TABS: 2.5000 mg | ORAL_TABLET | Freq: Every day | ORAL | 0 refills | Status: DC

## 2016-01-15 NOTE — Telephone Encounter (Signed)
Spoke with the pt to inform her of her echo results showing mild aortic stenosis and no intervention or surgery is needed at this time.  Informed the pt of recommendations per Dr Meda Coffee.  Informed the pt that Dr Meda Coffee recommends that she be compliant with taking her lasix 80 mg po daily.  Informed the pt that Dr Meda Coffee also wants to add metolazone 2.5 mg po daily x 1 week, and she should take this 30 mins prior to her morning dose of lasix.  Informed the pt that she should come in for a BMET and BNP for next Friday 10/13 at our office.  Pt agreed to this lab appt.  Confirmed the pharmacy of choice with the pt.  Pt verbalized understanding and agrees with this plan.

## 2016-01-15 NOTE — Telephone Encounter (Signed)
-----   Message from Dorothy Spark, MD sent at 01/14/2016  2:28 PM EDT ----- Her aortic stenosis remains mild she doesn't require any intervention or surgery at this time. She just really needs to be compliant with her Lasix, I would also add metolazone 2.5 mg daily to be taken 30 minutes prior to her Lasix in the morning for the next week. She will need BMP and BNP in 1 week.

## 2016-01-18 ENCOUNTER — Encounter: Payer: Self-pay | Admitting: Physician Assistant

## 2016-01-19 DIAGNOSIS — H401122 Primary open-angle glaucoma, left eye, moderate stage: Secondary | ICD-10-CM | POA: Diagnosis not present

## 2016-01-19 DIAGNOSIS — H401113 Primary open-angle glaucoma, right eye, severe stage: Secondary | ICD-10-CM | POA: Diagnosis not present

## 2016-01-22 ENCOUNTER — Other Ambulatory Visit: Payer: Medicare Other | Admitting: *Deleted

## 2016-01-22 DIAGNOSIS — I5032 Chronic diastolic (congestive) heart failure: Secondary | ICD-10-CM | POA: Diagnosis not present

## 2016-01-22 DIAGNOSIS — I350 Nonrheumatic aortic (valve) stenosis: Secondary | ICD-10-CM

## 2016-01-22 LAB — BASIC METABOLIC PANEL
BUN: 48 mg/dL — ABNORMAL HIGH (ref 7–25)
CO2: 31 mmol/L (ref 20–31)
Calcium: 9.3 mg/dL (ref 8.6–10.4)
Chloride: 96 mmol/L — ABNORMAL LOW (ref 98–110)
Creat: 1.23 mg/dL — ABNORMAL HIGH (ref 0.60–0.88)
Glucose, Bld: 89 mg/dL (ref 65–99)
Potassium: 4.7 mmol/L (ref 3.5–5.3)
Sodium: 136 mmol/L (ref 135–146)

## 2016-01-23 LAB — BRAIN NATRIURETIC PEPTIDE: Brain Natriuretic Peptide: 96.8 pg/mL (ref <100)

## 2016-01-27 ENCOUNTER — Telehealth: Payer: Self-pay | Admitting: *Deleted

## 2016-01-27 DIAGNOSIS — I5032 Chronic diastolic (congestive) heart failure: Secondary | ICD-10-CM

## 2016-01-27 NOTE — Telephone Encounter (Signed)
  Notes Recorded by Dorothy Spark, MD on 01/27/2016 at 9:10 AM EDT Please tell her to hold lasix today and tomorrow. We will check CMP tomorrow.      Notified the pt that per Dr Meda Coffee, she now recommends that she hold her lasix today and tomorrow, and we will recheck a cmet tomorrow, prior to her appt with Dayna Dunn PA-C at 2:30 pm. Pt verbalized understanding and agrees with this plan.

## 2016-01-27 NOTE — Telephone Encounter (Signed)
-----   Message from Dorothyann Gibbs, LPN sent at [id number]  8:56 AM EDT ----- This patient is requesting a call back ASAP. Thanks. ----- Message ----- From: Dorothy Spark, MD Sent: 01/26/2016   8:56 PM To: Shellia Cleverly, RN  Continue the same dose of lasix 80 mg po daily until she sees Melina Copa on 10/19  ----- Message ----- From: Shellia Cleverly, RN Sent: 01/26/2016   2:36 PM To: Dorothy Spark, MD  Reviewed information with patient again.  She is not taking any metolazone.   She c/o continual increased SOB and is asking if she should limit her activity level.  Advised to continue normal activity but to rest if necessary.  She has on appt Thursday 10/19 with Melina Copa and will f/u then. Since pt is having continued SOB to the point where she feels as though as needs to limit her activity, I will forward to Dr Meda Coffee to determine if pt should decrease for[place] or not.

## 2016-01-28 ENCOUNTER — Ambulatory Visit (INDEPENDENT_AMBULATORY_CARE_PROVIDER_SITE_OTHER): Payer: Medicare Other | Admitting: Physician Assistant

## 2016-01-28 ENCOUNTER — Other Ambulatory Visit: Payer: Medicare Other

## 2016-01-28 ENCOUNTER — Encounter: Payer: Self-pay | Admitting: Physician Assistant

## 2016-01-28 ENCOUNTER — Ambulatory Visit
Admission: RE | Admit: 2016-01-28 | Discharge: 2016-01-28 | Disposition: A | Discharge status: Home / Self Care | Payer: Medicare Other | Source: Ambulatory Visit | Attending: Physician Assistant | Admitting: Physician Assistant

## 2016-01-28 VITALS — BP 145/80 | HR 85 | Ht 63.0 in | Wt 215.1 lb

## 2016-01-28 DIAGNOSIS — E669 Obesity, unspecified: Secondary | ICD-10-CM

## 2016-01-28 DIAGNOSIS — I10 Essential (primary) hypertension: Secondary | ICD-10-CM | POA: Diagnosis not present

## 2016-01-28 DIAGNOSIS — I110 Hypertensive heart disease with heart failure: Secondary | ICD-10-CM

## 2016-01-28 DIAGNOSIS — R0602 Shortness of breath: Secondary | ICD-10-CM | POA: Diagnosis not present

## 2016-01-28 DIAGNOSIS — N179 Acute kidney failure, unspecified: Secondary | ICD-10-CM | POA: Diagnosis not present

## 2016-01-28 DIAGNOSIS — R05 Cough: Secondary | ICD-10-CM | POA: Diagnosis not present

## 2016-01-28 DIAGNOSIS — I5032 Chronic diastolic (congestive) heart failure: Secondary | ICD-10-CM

## 2016-01-28 DIAGNOSIS — IMO0001 Reserved for inherently not codable concepts without codable children: Secondary | ICD-10-CM

## 2016-01-28 LAB — CBC
HCT: 37 % (ref 35.0–45.0)
Hemoglobin: 12.1 g/dL (ref 11.7–15.5)
MCH: 30 pg (ref 27.0–33.0)
MCHC: 32.7 g/dL (ref 32.0–36.0)
MCV: 91.6 fL (ref 80.0–100.0)
MPV: 10.1 fL (ref 7.5–12.5)
PLATELETS: 335 10*3/uL (ref 140–400)
RBC: 4.04 MIL/uL (ref 3.80–5.10)
RDW: 13.2 % (ref 11.0–15.0)
WBC: 9.5 10*3/uL (ref 3.8–10.8)

## 2016-01-28 NOTE — Progress Notes (Signed)
Cardiology Office Note    Date:  01/28/2016  ID:  Maggi Graper, DOB May 16, 1932, MRN [id number] PCP:  [name], MD  Cardiologist:  Meda Coffee   Chief Complaint: f/u SOB  History of Present Illness:  Taylour Baire is a 82 y.o. female with history of obesity, chronic diastolic CHF, hypertensive heart disease, HTN, anemia, hemorrhoids, depression, diverticulosis, esophageal reflux, LBP, OAB, OSA, rheumatoid arthritis, venous insufficiency who presents for follow-up. At [facility] 01/14/16 she was reporting worsening edema, DOE and chest tightness in setting of noncompliance with diuretic. She was asked to take Lasix 80mg  daily and spironolactone, along with metolazone daily for 1 week. 2D Echo 01/14/16: mod LVH, EF 55-60%, grade 1 DD, high vent filling pressure, mild aortic stenosis, mod dilated LA, increased thickeness of septum, PASP 108mmHg. BMET on 01/22/16 showed K 4.7, BUN 48, Cr 1.23 (increased from 0.81 on 12/15/15), BNP normal. She was told to hold metolazone starting 10/13. On 01/26/16 she was advised to decrease Lasix to 40mg  daily but she had reported terrible leg cramps so was told not to take any more diuretics pending today's appointment.  She returns for follow-up continuing to complain of exertional dyspnea. She reports she saw no improvement whatsoever on the diuretic. The dyspnea has been progressive for the last year, worse in the last few weeks. She states her quality of life has gone downhill. Her edema persists but it is somewhat better today per her report. She finds it terribly difficult to take her diuretic faithfully due to incontinence from prior bladder surgery and the inconvenience of having to stay home nearby a bathroom. She also reports some episodes of chest tightness accompanying the dyspnea when she exerts herself. She is not entirely compliant with CPAP at present time due to uncomfortable nature of mask, so uses an oxygen concentrator at night sometimes instead. +  Family history of MI in her grandmother.    Past Medical History:  Diagnosis Date  . Anemia    h/o hemorrhoidal bleeding and blood transfusion  . Cardiomegaly   . Chronic diastolic CHF (congestive heart failure) ([place])   . Depression   . Diverticulosis   . DOE (dyspnea on exertion)   . Esophageal reflux   . GERD (gastroesophageal reflux disease)   . Hypothyroidism   . Insomnia   . LBP (low back pain)   . OAB (overactive bladder)   . Obesity   . OSA (obstructive sleep apnea)   . Osteoarthritis   . Rheumatoid arthritis(714.0)   . Shoulder pain, bilateral   . Unspecified essential hypertension   . Venous insufficiency     Past Surgical History:  Procedure Laterality Date  . APPENDECTOMY  1953  . bladder abduction-1996  1996  . breast biopsy Right 1980  . [place]  . COSMETIC SURGERY  1996  . CYSTOCELE REPAIR    . FLEXIBLE SIGMOIDOSCOPY N/A 04/10/2015   Procedure: FLEXIBLE SIGMOIDOSCOPY;  Surgeon: Arta Silence, MD;  Location: [facility] ENDOSCOPY;  Service: Endoscopy;  Laterality: N/A;  . knee arthroscopy Right 1996, 2010  . KNEE ARTHROSCOPY W/ AUTOGENOUS CARTILAGE IMPLANTATION (ACI) PROCEDURE Left 1994, 1995  . SHOULDER SURGERY  1990  . TOTAL ABDOMINAL HYSTERECTOMY  1972  . VESICOVAGINAL FISTULA CLOSURE W/ TAH    . WRIST SURGERY  1967    Current Medications: Current Outpatient Prescriptions  Medication Sig Dispense Refill  . busPIRone (BUSPAR) 7.5 MG tablet Take 1 tablet (7.5 mg total) by mouth 3 (three) times  daily. 20 tablet 0  . cholecalciferol (VITAMIN D) 1000 UNITS tablet Take 1,000 Units by mouth daily.    . furosemide (LASIX) 80 MG tablet Take 1 tablet (80 mg total) by mouth daily. 90 tablet 3  . hydrALAZINE (APRESOLINE) 25 MG tablet Take 1 tablet (25 mg total) by mouth 3 (three) times daily. 270 tablet 3  . HYDROcodone-acetaminophen (NORCO) 7.5-325 MG tablet Take 1 tablet by mouth every 6 (six) hours as needed for moderate pain. 20  tablet 0  . latanoprost (XALATAN) 0.005 % ophthalmic solution Place 1 drop into both eyes at bedtime.    Marland Kitchen levothyroxine (SYNTHROID, LEVOTHROID) 100 MCG tablet Take 100 mcg by mouth daily before breakfast.    . losartan (COZAAR) 100 MG tablet Take 100 mg by mouth daily.    . Melatonin 3 MG TABS Take by mouth.    . metolazone (ZAROXOLYN) 2.5 MG tablet Take 1 tablet (2.5 mg total) by mouth daily. For one week only.  Take 30 minutes prior to your morning lasix. 7 tablet 0  . metoprolol (LOPRESSOR) 50 MG tablet Take 50 mg by mouth 2 (two) times daily.    . Multiple Vitamin (MULTI-DAY VITAMINS PO) Take 1 tablet by mouth daily.     . Multiple Vitamins-Minerals (ICAPS AREDS 2) CAPS Take 2 capsules by mouth 2 (two) times daily.    . Multiple Vitamins-Minerals (VITEYES AREDS FORMULA PO) Take 4 capsules by mouth daily.    Marland Kitchen omeprazole (PRILOSEC) 20 MG capsule Take 20 mg by mouth 2 (two) times daily before a meal.     . polyethylene glycol (MIRALAX / GLYCOLAX) packet Take 17 g by mouth daily as needed for moderate constipation.     . potassium chloride SA (K-DUR,KLOR-CON) 20 MEQ tablet Take 1 tablet (20 mEq total) by mouth daily. 90 tablet 3  . rOPINIRole (REQUIP) 1 MG tablet TAKE 1 TABLET BY MOUTH EVERY EVENING FOR RESTLESS LEGS  12  . sennosides-docusate sodium (SENOKOT-S) 8.6-50 MG tablet Take 1 tablet by mouth daily. (Patient taking differently: Take 1 tablet by mouth daily as needed. )    . spironolactone (ALDACTONE) 25 MG tablet Take 1 tablet (25 mg total) by mouth daily. 90 tablet 3   No current facility-administered medications for this visit.      Allergies:   Review of patient's allergies indicates no known allergies.   Social History   Social History  . Marital status: Widowed    Spouse name: N/A  . Number of children: 3  . Years of education: College   Occupational History  . Retired    Social History Main Topics  . Smoking status: Former Smoker    Years: 10.00    Types:  Cigarettes    Quit date: 04/11/1984  . Smokeless tobacco: Never Used  . Alcohol use 0.0 oz/week     Comment: cocktail daily  . Drug use: No  . Sexual activity: Not Asked   Other Topics Concern  . None   Social History Narrative   Drinks 1 cup of coffee in the morning     Family History:  The patient's family history includes Breast cancer in her maternal aunt; COPD in her father; Heart attack in her maternal grandmother; Lung cancer in her mother.   ROS:   Please see the history of present illness.  All other systems are reviewed and otherwise negative.    PHYSICAL EXAM:   VS:  BP (!) 145/80   Pulse 85   Ht 5'  3" (1.6 m)   Wt 215 lb 1.9 oz (97.6 kg)   SpO2 94%   BMI 38.11 kg/m   BMI: Body mass index is 38.11 kg/m. GEN: Well nourished, well developed obese WF, in no acute distress  HEENT: normocephalic, atraumatic Neck: no JVD, carotid bruits, or masses Cardiac: RRR; no murmurs, rubs, or gallops, no edema  Respiratory:  clear to auscultation bilaterally, normal work of breathing GI: soft, nontender, nondistended, + BS MS: no deformity or atrophy  Skin: warm and dry, no rash Neuro:  Alert and Oriented x 3, Strength and sensation are intact, follows commands Psych: euthymic mood, full affect  Wt Readings from Last 3 Encounters:  01/28/16 215 lb 1.9 oz (97.6 kg)  01/14/16 216 lb (98 kg)  12/15/15 205 lb (93 kg)      Studies/Labs Reviewed:   EKG:  EKG was not ordered today but EKG from 10/5 showed SB 54bpm, TWI III.  Recent Labs: 04/20/2015: TSH 2.373 12/15/2015: ALT 11; Hemoglobin 11.4; Platelets 358 01/22/2016: Brain Natriuretic Peptide 96.8; BUN 48; Creat 1.23; Potassium 4.7; Sodium 136   Lipid Panel    Component Value Date/Time   CHOL 226 (HH) 12/19/2006 0916   TRIG 132 12/19/2006 0916   HDL 43.9 12/19/2006 0916   CHOLHDL 5.1 CALC 12/19/2006 0916   VLDL 26 12/19/2006 0916   LDLDIRECT 159.2 12/19/2006 0916    Additional studies/ records that were reviewed  today include: Summarized above.    ASSESSMENT & PLAN:   1. Shortness of breath - she did not feel better with increased diuretics and renal function bumped with normal BNP, thus I am concerned there might be an additional process going on here. Reviewed further workup with cath vs nuc with patient. Risks and benefits of cardiac catheterization have been discussed with the patient.  These include bleeding, infection, kidney damage, stroke, heart attack, death. The patient requests I speak with Dr. Meda Coffee to find out what she recommends. The patient is nervous because her friend Lovell Sheehan died after a heart cath. Will also obtain 2V CXR along with CBC and d-dimer to exclude additional causes of SOB/chest tightness.  2. Chronic diastolic CHF/hypertensive heart disease - see above. Diuretics on hold due to AKI and leg cramps. 3. Obesity - this may be contributing to her symptoms. 4. Acute kidney injury - recheck labs today. Dr. Meda Coffee had ordered CMET. 5. Essential HTN - she reports BP is slightly elevated today because she is under stress. Recent BP with Dr. Meda Coffee was normal. Follow for now. Can consider increasing hydralazine if needed.  Disposition: F/u with me or Dr. Meda Coffee in 3 weeks.   Medication Adjustments/Labs and Tests Ordered: Current medicines are reviewed at length with the patient today.  Concerns regarding medicines are outlined above. Medication changes, Labs and Tests ordered today are summarized above and listed in the Patient Instructions accessible in Encounters.   Raechel Ache PA-C  01/28/2016 2:44 PM    [address] [place], [place], [place]  [address] Phone: [phone]; Fax: [phone]

## 2016-01-28 NOTE — Patient Instructions (Addendum)
Medication Instructions:  Your physician recommends that you continue on your current medications as directed. Please refer to the Current Medication list given to you today.   Labwork: TODAY:  CBC, CMET, & DDIMER  Testing/Procedures: A chest x-ray takes a picture of the organs and structures inside the chest, including the heart, lungs, and blood vessels. This test can show several things, including, whether the heart is enlarges; whether fluid is building up in the lungs; and whether pacemaker / defibrillator leads are still in place.   Follow-Up: Your physician recommends that you schedule a follow-up appointment in: [place] OR DAYNA DUNN, PA-C   Any Other Special Instructions Will Be Listed Below (If Applicable).  X-rays X-rays are tests that create pictures of the inside of your body using radiation. Different body parts absorb different amounts of radiation, which show up on the X-ray pictures in shades of black, gray, and white.  X-rays are used to look for many health conditions, including broken bones, lung problems, and some types of cancer.  LET [facility] CARE PROVIDER KNOW ABOUT:  Any allergies you have.  All medicines you are taking, including vitamins, herbs, eye drops, creams, and over-the-counter medicines.  Previous surgeries you have had.  Medical conditions you have. RISK AND COMPLICATIONS Getting an X-ray is a safe procedure.  BEFORE THE PROCEDURE  Tell the X-ray technician if you are pregnant or might be pregnant.  You may be asked to wear a protective lead apron to hide parts of your body from the X-ray.  You usually will need to undress whatever part of your body needs the X-ray. You will be given a hospital gown to wear, if needed.  You may need to remove your glasses, jewelry, and other metal objects. PROCEDURE   The X-ray machine creates a picture by using a tiny burst of radiation. It is painless.  You may need to have several  pictures taken at different angles.  You will need to try to be as still as you can during the examination to get the best possible images. AFTER THE PROCEDURE  You will be able to resume your normal activities.  The X-ray will be examined by your health care provider or a radiology specialist.  It is your responsibility to get your test results. Ask your health care provider when to expect your results and how to get them.   This information is not intended to replace advice given to you by your health care provider. Make sure you discuss any questions you have with your health care provider.   Document Released: 03/28/2005 Document Revised: 04/18/2014 Document Reviewed: 05/22/2013 Elsevier Interactive Patient Education [employer].    If you need a refill on your cardiac medications before your next appointment, please call your pharmacy.

## 2016-01-29 ENCOUNTER — Other Ambulatory Visit: Payer: Medicare Other | Admitting: *Deleted

## 2016-01-29 DIAGNOSIS — I350 Nonrheumatic aortic (valve) stenosis: Secondary | ICD-10-CM | POA: Diagnosis not present

## 2016-01-29 DIAGNOSIS — I10 Essential (primary) hypertension: Secondary | ICD-10-CM | POA: Diagnosis not present

## 2016-01-29 DIAGNOSIS — I5032 Chronic diastolic (congestive) heart failure: Secondary | ICD-10-CM | POA: Diagnosis not present

## 2016-01-29 LAB — COMPREHENSIVE METABOLIC PANEL
ALBUMIN: 3.7 g/dL (ref 3.6–5.1)
ALK PHOS: 72 U/L (ref 33–130)
ALT: 6 U/L (ref 6–29)
AST: 12 U/L (ref 10–35)
BILIRUBIN TOTAL: 0.4 mg/dL (ref 0.2–1.2)
BUN: 21 mg/dL (ref 7–25)
CALCIUM: 8.4 mg/dL — AB (ref 8.6–10.4)
CO2: 27 mmol/L (ref 20–31)
Chloride: 104 mmol/L (ref 98–110)
Creat: 0.98 mg/dL — ABNORMAL HIGH (ref 0.60–0.88)
Glucose, Bld: 91 mg/dL (ref 65–99)
Potassium: 5 mmol/L (ref 3.5–5.3)
Sodium: 140 mmol/L (ref 135–146)
Total Protein: 6.5 g/dL (ref 6.1–8.1)

## 2016-01-29 LAB — D-DIMER, QUANTITATIVE: D-Dimer, Quant: 0.47 mcg/mL FEU (ref <0.50)

## 2016-01-29 LAB — PROTIME-INR
INR: 1.1
Prothrombin Time: 11.6 s — ABNORMAL HIGH (ref 9.0–11.5)

## 2016-01-29 NOTE — Addendum Note (Signed)
Addended by: Eulis Foster on: 01/29/2016 10:34 AM   Modules accepted: Orders

## 2016-01-29 NOTE — Addendum Note (Signed)
Addended by: Eulis Foster on: 01/29/2016 03:28 PM   Modules accepted: Orders

## 2016-02-01 ENCOUNTER — Telehealth: Payer: Self-pay | Admitting: Cardiology

## 2016-02-01 ENCOUNTER — Encounter: Payer: Self-pay | Admitting: *Deleted

## 2016-02-01 MED ORDER — FUROSEMIDE 20 MG PO TABS: 20.0000 mg | ORAL_TABLET | Freq: Every day | ORAL | Status: DC

## 2016-02-01 NOTE — Telephone Encounter (Signed)
Pt is calling into the office this morning very upset with her current health status.  Pt states she is very upset that she was called on last Friday and was told that she needs a left/right heart cath.  Pt states she is very confused about this.  Pt states she is concerned about this procedure being scheduled, without seeing Melina Copa or Dr Meda Coffee this week to further discuss the rationale as to why this is being ordered.  Pt states she is very frustrated and confused about her med regimen as well too.  Pt states that on 10/17 she was told to hold her lasix and metolazone, due to elevated renal function.  Pt states her diuretic has been held since then, and nobody has told her when to resume this med.  Pt is very upset with missed blood work, and that she hasn't heard what her new renal function is at this time.  Pt states she is refusing to have a cath done, until she see's either Dayna or Dr Meda Coffee sometime this week.  Pt states she needs to see a Provider to ask multiple questions regarding the cath, referral for Pulmonology, how and when to resume her diuretic back, and overall general understanding of where she should go from here.  Pt requesting to see someone this week, before proceeding with scheduling her cath.  Pt states she would like someone to advise on her diuretic regimen now if possible, once her recent cmet is resulted.  Apologies provided to the pt and informed her that Lisbeth Renshaw has an availability on this Friday 10/27 at 0830, and she could come in at that time, to have questions answered and to discuss in more detail the need for her cath and possibly scheduling of this, if she decides to proceed or not.  Informed the pt that I will have either Dayna or Dr Meda Coffee advise on her recent labs done, so we can advise on a current diuretic regimen for the pt.  Advised the pt to write down her questions and concerns, and have this available to review with Dayna on 10/27 OV.  Pt verbalized  understanding and agrees with this plan.  Pt states she will come into see Dayna on this Friday, 10/27, and may consider scheduling her cath then, once she has clear understanding.  Informed the pt that I will route this message to both Dayna and Dr Meda Coffee to advise on lab results and when to resume diuretic, and follow-up with the pt thereafter.  Pt verbalized understanding and agrees with this plan.

## 2016-02-01 NOTE — Telephone Encounter (Incomplete)
Please let patient know her labwork to determine whether to restart the diuretic was drawn on Friday 01/29/16 and available for review this morning. Kidney function Since she did not tolerate any higher doses of Lasix from 40mg  or above, as well as metolazone, I would recommend to restart Lasix 20mg  daily (without metolazone). I spoke at length with her and her daughter about my recommendation to proceed with heart catheterization at our [facility] on 01/28/16. I went over how the procedure is performed and reviewed risks of catheterization and potential outcomes. She had requested Dr. Francesca Oman input first - Dr. Meda Coffee agreed with proceeding with cath so we let the patient know this on Friday. I am happy to go over this again with her at her office visit coming up later this week. [name] asked me to be in the hospital on Tues/Wed instead of clinic to help cover a colleague's shift, so if she wants to be seen sooner, we have several other APPs that can review this as well.  Please let her know we are attempting a stepwise approach to her shortness of breath - one thing at a time. Since she didn't really see any improvement with the diuretic, the heart catheterization is really the next step. If this shows a heart blockage that needs fixing or elevated pressures in her heart, we treat this from a heart perspective. But if her heart workup is totally unrevealing and it does not appear her shortness of breath is related to anything cardiac, that's what would prompt referral back to pulmonology. Please let me know if she has any further questions.  Dayna Dunn PA-C

## 2016-02-01 NOTE — Telephone Encounter (Signed)
New message ° ° ° ° ° ° °Pt request to talk to the nurse.  She would not tell me what she wanted °

## 2016-02-01 NOTE — Telephone Encounter (Signed)
Please let patient know her labwork to determine whether to restart the diuretic was drawn on Friday 01/29/16 and available for review this morning. Kidney function has improved. Since she did not tolerate any higher doses of Lasix from 40mg  or above, as well as metolazone, I would recommend to restart Lasix 20mg  daily (without metolazone). I spoke at length with her and her daughter about my recommendation to proceed with heart catheterization at our [facility] on 01/28/16. I went over how the procedure is performed and reviewed risks of catheterization and potential outcomes. She had requested Dr. Francesca Oman input first - Dr. Meda Coffee agreed with proceeding with cath so we let the patient know this on Friday. I am happy to go over this again with her at her office visit coming up later this week. [name] asked me to be in the hospital on Tues/Wed instead of clinic to help cover a colleague's shift, so if she wants to be seen sooner, we have several other APPs that can review this as well. We will need to consider another BMET once she is back on the Lasix, timing to be determined based on when she follows up in clinic.  Please let her know we are attempting a stepwise approach to her shortness of breath - one thing at a time. Since she didn't really see any improvement with the diuretic, the heart catheterization is really the next step. If this shows a heart blockage that needs fixing or elevated pressures in her heart, we treat this from a heart perspective. But if her heart workup is totally unrevealing and it does not appear her shortness of breath is related to anything cardiac, that's what would prompt referral back to pulmonology. Please let me know if she has any further questions.  Lori-Ann Lindfors PA-C

## 2016-02-01 NOTE — Telephone Encounter (Signed)
Notified the pt that per Melina Copa PA-C, her lab results from 10/20 came back and showed that her kidney function has improved.  Informed the pt that per Dayna, since she did not tolerate any higher doses of lasix from 40 mg r above, as well as metolazone, she now recommends that she restart taking lasix 20 mg po daily (without metolazone). Also informed the pt that per [facility], she is glad to see her in the office this Friday 10/27, to discuss her needing a left/right cardiac cath again.  Offered the pt a sooner appt with another Provider in our office, but the pt only wants to see Dayna or Dr Meda Coffee, whomever is in the office this week.  Informed the pt that we will also need to do another BMET, to assess her renal function while restarting lasix is.  Informed the pt that this will be based on timing, when she follows up in the clinic this Friday.   Informed the pt that Dayna wanted me to reassure her that we are attempting a stepwise approach to her SOB.  We are taking one step-at-a-time.  Informed the pt that per Dayna, since she didn't really see any improvement with a diuretic, the cath is really the next step.  Informed the pt that if she proceeds with the cath and this shows a heart blockage that needs fixing or elevated pressures in her heart, then we can treat this from a cardiac perspective.  Informed the pt that if her heart cath is totally unrevealing, and it does not appear her SOB is related to anything cardiac, then that would then prompt [place] to refer her back to Pulmonology.   Pt verbalized understanding and agrees with the plan mentioned above.  Pt gracious that Lisbeth Renshaw is taking the time to see her again in the clinic, to go over all this information for reassurance. Pt states she has plenty of lasix 20 mg tabs on hand as well.  Pt states she will not start taking her lasix 20 mg until tomorrow 10/24, for she states its too late in the day to start this, for she has plans to go to [employer] from noon to later this afternoon.  Pt states she will be here for clinic on 10/27.

## 2016-02-03 NOTE — Progress Notes (Signed)
Cardiology Office Note    Date:  02/05/2016  ID:  Lisa Mcclure, DOB [birth year], MRN [id number] PCP:  [name], MD  Cardiologist:  Meda Coffee   Chief Complaint: discuss cath  History of Present Illness:  Lisa Mcclure is a 82 y.o. female with history of obesity, chronic diastolic CHF, hypertensive heart disease, HTN, anemia, hemorrhoids, depression, diverticulosis, esophageal reflux, LBP, OAB, OSA, rheumatoid arthritis, venous insufficiency who presents for follow-up to discuss cardiac cath.  At [facility] 01/14/16 with Dr. Meda Coffee, she was reporting worsening edema, DOE and chest tightness in setting of noncompliance with diuretic due to history bladder surgery and reported overflow incontinence. Dr. Meda Coffee recommended she try Lasix 80mg  daily and spironolactone, along with metolazone daily for 1 week. 2D Echo 01/14/16: mod LVH, EF 55-60%, grade 1 DD, high vent filling pressure, mild aortic stenosis, mod dilated LA, increased thickeness of septum, PASP 48mmHg. BMET on 01/22/16 showed K 4.7, BUN 48, Cr 1.23 (increased from 0.81 on 12/15/15), BNP normal. She was told to hold metolazone starting 10/13. On 01/26/16 she was advised to decrease Lasix to 40mg  daily but she had reported terrible leg cramps so was told not to take any more diuretics pending further follow-up. At her OV with me on 01/28/16, she said she did not feel any different with the high dose diuretics compared to off the diuretic. Given her persistent symptoms, I discussed R/L cardiac cath with her and her daughter in depth. They preferred I discuss with Dr. Meda Coffee first. Dr. Meda Coffee agreed. The patient requested an additional office visit to further discuss cardiac cath. Her repeat labs on 01/29/16 showed Cr improvement to 0.98 thus I recommended to restart Lasix at 20mg  daily to see if we could find at least some compromise of diuretic therapy she could tolerate. D-dimer was negative and CXR was unremarkable, negative for any fluid. Of  note, she reported being unable to tolerate CPAP mask so had been using O2 concentrator at night for her CPAP.  She comes in today to revisit the conversation regarding cath. She expresses dissatisfaction with this recommendation and reports to me that in the past she had a ground-glass abnormality on chest imaging and wants to pursue pulm f/u first. She said she was told at one point she needed removal of part of her lung for lung cancer, then came to see Dr. Halford Chessman who informed her she did not have cancer. This was several years ago. She and her daughter would like to pursue pulm eval before proceeding with any further cardiac testing. She reports she had bronchitis several months ago and since that time has not been able to "kick it." She has episodic wheezing. The patient is tolerating Lasix 20mg  and spironolactone 25mg  daily but states she stays wet from incontinence and reaffirms she is not able to tolerate any higher doses. LEE continues to wax and wanes - goes up and down throughout the day. Her blood pressure is up but she said she did not take her meds yet today. She did not realize her hydralazine was supposed to be TID.  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 We did review the importance of weight control, portion control, and regular physical activity and she is aware that she is considered obese (we discussed that BNP can sometimes be inaccurate due to this). She thinks she stays within 1500 calories but her daughter said  she disputes this.   Past Medical History:  Diagnosis Date  . Anemia    h/o hemorrhoidal bleeding and blood transfusion  . Cardiomegaly   . Chronic diastolic CHF (congestive heart failure) ([place])   . Depression   . Diverticulosis   . DOE (dyspnea on exertion)   . Esophageal reflux   . GERD (gastroesophageal reflux disease)   . Hypothyroidism   . Insomnia     . LBP (low back pain)   . OAB (overactive bladder)   . Obesity   . OSA (obstructive sleep apnea)   . Osteoarthritis   . Rheumatoid arthritis(714.0)   . Shoulder pain, bilateral   . Unspecified essential hypertension   . Venous insufficiency     Past Surgical History:  Procedure Laterality Date  . APPENDECTOMY  1953  . bladder abduction-1996  1996  . breast biopsy Right 1980  . [place]  . COSMETIC SURGERY  1996  . CYSTOCELE REPAIR    . FLEXIBLE SIGMOIDOSCOPY N/A 04/10/2015   Procedure: FLEXIBLE SIGMOIDOSCOPY;  Surgeon: Arta Silence, MD;  Location: [facility] ENDOSCOPY;  Service: Endoscopy;  Laterality: N/A;  . knee arthroscopy Right 1996, 2010  . KNEE ARTHROSCOPY W/ AUTOGENOUS CARTILAGE IMPLANTATION (ACI) PROCEDURE Left 1994, 1995  . SHOULDER SURGERY  1990  . TOTAL ABDOMINAL HYSTERECTOMY  1972  . VESICOVAGINAL FISTULA CLOSURE W/ TAH    . WRIST SURGERY  1967    Current Medications: Current Outpatient Prescriptions  Medication Sig Dispense Refill  . busPIRone (BUSPAR) 7.5 MG tablet Take 1 tablet (7.5 mg total) by mouth 3 (three) times daily. 20 tablet 0  . cholecalciferol (VITAMIN D) 1000 UNITS tablet Take 1,000 Units by mouth daily.    . furosemide (LASIX) 20 MG tablet Take 1 tablet (20 mg total) by mouth daily. 30 tablet   . hydrALAZINE (APRESOLINE) 25 MG tablet Take 1 tablet (25 mg total) by mouth 3 (three) times daily. 270 tablet 3  . HYDROcodone-acetaminophen (NORCO) 7.5-325 MG tablet Take 1 tablet by mouth every 6 (six) hours as needed for moderate pain. 20 tablet 0  . latanoprost (XALATAN) 0.005 % ophthalmic solution Place 1 drop into both eyes at bedtime.    Marland Kitchen levothyroxine (SYNTHROID, LEVOTHROID) 100 MCG tablet Take 100 mcg by mouth daily before breakfast.    . losartan (COZAAR) 100 MG tablet Take 100 mg by mouth daily.    . Melatonin 3 MG TABS Take by mouth.    . metoprolol (LOPRESSOR) 50 MG tablet Take 50 mg by mouth 2 (two) times daily.     . Multiple Vitamin (MULTI-DAY VITAMINS PO) Take 1 tablet by mouth daily.     . Multiple Vitamins-Minerals (ICAPS AREDS 2) CAPS Take 2 capsules by mouth 2 (two) times daily.    . Multiple Vitamins-Minerals (VITEYES AREDS FORMULA PO) Take 4 capsules by mouth daily.    Marland Kitchen omeprazole (PRILOSEC) 20 MG capsule Take 20 mg by mouth 2 (two) times daily before a meal.     . polyethylene glycol (MIRALAX / GLYCOLAX) packet Take 17 g by mouth daily as needed for moderate constipation.     . potassium chloride SA (K-DUR,KLOR-CON) 20 MEQ tablet Take 1 tablet (20 mEq total) by mouth daily. 90 tablet 3  . rOPINIRole (REQUIP) 1 MG tablet TAKE 1 TABLET BY MOUTH EVERY EVENING FOR RESTLESS LEGS  12  . sennosides-docusate sodium (SENOKOT-S) 8.6-50 MG tablet Take 1 tablet by mouth daily. (Patient taking differently: Take 1 tablet  by mouth daily as needed. )    . spironolactone (ALDACTONE) 25 MG tablet Take 1 tablet (25 mg total) by mouth daily. 90 tablet 3   No current facility-administered medications for this visit.      Allergies:   Review of patient's allergies indicates no known allergies.   Social History   Social History  . Marital status: Widowed    Spouse name: N/A  . Number of children: 3  . Years of education: College   Occupational History  . Retired    Social History Main Topics  . Smoking status: Former Smoker    Years: 10.00    Types: Cigarettes    Quit date: 04/11/1984  . Smokeless tobacco: Never Used  . Alcohol use 0.0 oz/week     Comment: cocktail daily  . Drug use: No  . Sexual activity: Not Asked   Other Topics Concern  . None   Social History Narrative   Drinks 1 cup of coffee in the morning     Family History:  The patient's family history includes Breast cancer in her maternal aunt; COPD in her father; Heart attack in her maternal grandmother; Lung cancer in her brother and mother.   ROS:   Please see the history of present illness. All other systems are reviewed and  otherwise negative.    PHYSICAL EXAM:   VS:  BP (!) 158/102   Pulse 78   Resp 16   Ht 5\' 3"  (1.6 m)   Wt 218 lb 8 oz (99.1 kg)   BMI 38.71 kg/m   BMI: Body mass index is 38.71 kg/m. GEN: Well nourished, well developed obese WF in no acute distress  HEENT: normocephalic, atraumatic Neck: no JVD, carotid bruits, or masses Cardiac: RRR, mild SEM, no rubs or gallops, trace bilateral LE edema  Respiratory:  clear to auscultation bilaterally, normal work of breathing GI: soft, nontender, nondistended, + BS MS: no deformity or atrophy  Skin: warm and dry, no rash Neuro:  Alert and Oriented x 3, Strength and sensation are intact, follows commands Psych: euthymic mood, full affect  Wt Readings from Last 3 Encounters:  02/05/16 218 lb 8 oz (99.1 kg)  01/28/16 215 lb 1.9 oz (97.6 kg)  01/14/16 216 lb (98 kg)      Studies/Labs Reviewed:   EKG: EKG was not ordered today.  Recent Labs: 04/20/2015: TSH 2.373 01/22/2016: Brain Natriuretic Peptide 96.8 01/28/2016: Hemoglobin 12.1; Platelets 335 01/29/2016: ALT 6; BUN 21; Creat 0.98; Potassium 5.0; Sodium 140   Lipid Panel    Component Value Date/Time   CHOL 226 (HH) 12/19/2006 0916   TRIG 132 12/19/2006 0916   HDL 43.9 12/19/2006 0916   CHOLHDL 5.1 CALC 12/19/2006 0916   VLDL 26 12/19/2006 0916   LDLDIRECT 159.2 12/19/2006 0916    Additional studies/ records that were reviewed today include: Summarized above.    ASSESSMENT & PLAN:   1. Shortness of breath - patient declines R/LHC at this time to further evaluate from cardiac perspective. She would like to pursue pulm evaluation. Will arrange PFTs and refer back to Dr. Halford Chessman. She reaffirms she is unable to tolerate higher doses of diuretic at this time. We also reviewed that long-term weight control will also be essential to managing her dyspnea. 2. Chronic diastolic CHF/hypertensive heart disease - see above. Continue current regimen. Suspect multifactorial picture. If pulm  feels appropriate, RHC would be helpful to elucidate volume status. Recommended low sodium diet <2g per day, fluid restriction <2L  per day and daily weights. If weight continues to rise, we may need to increase her Lasix to 40mg  daily but as above, she is generally unwilling to take higher doses due to incontinence and leg cramps.  3. Obesity - see above. 4. Acute kidney injury - improved by f/u labs last week.  5. Essential HTN - instructed patient to start taking her hydralazine TID. Advised for her to call if BP running [id number] systolic at which time I would consider increasing hydralazine to 50mg  TID. She did not tolerate amlodipine due to LEE.  Disposition: F/u with Dr. Meda Coffee or me in 6 weeks.    Medication Adjustments/Labs and Tests Ordered: Current medicines are reviewed at length with the patient today.  Concerns regarding medicines are outlined above. Medication changes, Labs and Tests ordered today are summarized above and listed in the Patient Instructions accessible in Encounters.   Raechel Ache PA-C  02/05/2016 9:02 AM    [address], [place], [place]  [address] Phone: [phone]; Fax: [phone]

## 2016-02-05 ENCOUNTER — Encounter: Payer: Self-pay | Admitting: Physician Assistant

## 2016-02-05 ENCOUNTER — Ambulatory Visit (INDEPENDENT_AMBULATORY_CARE_PROVIDER_SITE_OTHER): Payer: Medicare Other | Admitting: Physician Assistant

## 2016-02-05 VITALS — BP 158/102 | HR 78 | Resp 16 | Ht 63.0 in | Wt 218.5 lb

## 2016-02-05 DIAGNOSIS — E669 Obesity, unspecified: Secondary | ICD-10-CM

## 2016-02-05 DIAGNOSIS — I10 Essential (primary) hypertension: Secondary | ICD-10-CM

## 2016-02-05 DIAGNOSIS — IMO0001 Reserved for inherently not codable concepts without codable children: Secondary | ICD-10-CM

## 2016-02-05 DIAGNOSIS — I110 Hypertensive heart disease with heart failure: Secondary | ICD-10-CM

## 2016-02-05 DIAGNOSIS — N179 Acute kidney failure, unspecified: Secondary | ICD-10-CM

## 2016-02-05 DIAGNOSIS — I5032 Chronic diastolic (congestive) heart failure: Secondary | ICD-10-CM

## 2016-02-05 DIAGNOSIS — R0602 Shortness of breath: Secondary | ICD-10-CM

## 2016-02-05 MED ORDER — HYDRALAZINE HCL 25 MG PO TABS: 25.0000 mg | ORAL_TABLET | Freq: Three times a day (TID) | ORAL | 3 refills | Status: DC

## 2016-02-05 NOTE — Patient Instructions (Addendum)
Medication Instructions:   START TAKING HYDRALAZINE 25 MG THREE TIME A DAY   If you need a refill on your cardiac medications before your next appointment, please call your pharmacy.  Labwork: NONE ORDER TODAY    Testing/Procedures: Your physician has recommended that you have a pulmonary function test. Pulmonary Function Tests are a group of tests that measure how well air moves in and out of your lungs.   You have been referred to TO [facility]UL[facility] WITH DR SOOD ASAP    Follow-Up: IN 6 WEEKS WITH DR NELSON OR DANA DUNN ON A DAY DR Meda Coffee IN OFFICE   Any Other Special Instructions Will Be Listed Below (If Applicable).

## 2016-02-11 ENCOUNTER — Ambulatory Visit (HOSPITAL_COMMUNITY)
Admission: RE | Admit: 2016-02-11 | Discharge: 2016-02-11 | Disposition: A | Discharge status: Home / Self Care | Payer: Medicare Other | Source: Ambulatory Visit | Attending: Physician Assistant | Admitting: Physician Assistant

## 2016-02-11 DIAGNOSIS — R0602 Shortness of breath: Secondary | ICD-10-CM | POA: Diagnosis not present

## 2016-02-11 LAB — PULMONARY FUNCTION TEST
DL/VA % PRED: 98 %
DL/VA: 4.63 ml/min/mmHg/L
DLCO UNC: 15.03 ml/min/mmHg
DLCO unc % pred: 65 %
FEF 25-75 Post: 1.69 L/sec
FEF 25-75 Pre: 1.14 L/sec
FEF2575-%Change-Post: 47 %
FEF2575-%Pred-Post: 130 %
FEF2575-%Pred-Pre: 88 %
FEV1-%CHANGE-POST: 7 %
FEV1-%PRED-PRE: 74 %
FEV1-%Pred-Post: 79 %
FEV1-POST: 1.45 L
FEV1-PRE: 1.34 L
FEV1FVC-%CHANGE-POST: 5 %
FEV1FVC-%Pred-Pre: 103 %
FEV6-%Change-Post: 2 %
FEV6-%PRED-PRE: 75 %
FEV6-%Pred-Post: 77 %
FEV6-POST: 1.78 L
FEV6-PRE: 1.74 L
FEV6FVC-%PRED-POST: 106 %
FEV6FVC-%Pred-Pre: 106 %
FVC-%Change-Post: 2 %
FVC-%PRED-POST: 72 %
FVC-%PRED-PRE: 71 %
FVC-POST: 1.78 L
FVC-PRE: 1.74 L
POST FEV6/FVC RATIO: 100 %
PRE FEV6/FVC RATIO: 100 %
Post FEV1/FVC ratio: 81 %
Pre FEV1/FVC ratio: 77 %
RV % PRED: 70 %
RV: 1.66 L
TLC % PRED: 76 %
TLC: 3.75 L

## 2016-02-11 MED ORDER — ALBUTEROL SULFATE (2.5 MG/3ML) 0.083% IN NEBU
2.5000 mg | INHALATION_SOLUTION | Freq: Once | RESPIRATORY_TRACT | Status: AC
  Administered 2016-02-11: 2.5 mg via RESPIRATORY_TRACT

## 2016-02-16 DIAGNOSIS — H401122 Primary open-angle glaucoma, left eye, moderate stage: Secondary | ICD-10-CM | POA: Diagnosis not present

## 2016-02-16 DIAGNOSIS — H401113 Primary open-angle glaucoma, right eye, severe stage: Secondary | ICD-10-CM | POA: Diagnosis not present

## 2016-02-18 ENCOUNTER — Ambulatory Visit: Payer: Medicare Other | Admitting: Physician Assistant

## 2016-02-24 ENCOUNTER — Encounter: Payer: Self-pay | Admitting: Pulmonary Disease

## 2016-02-24 ENCOUNTER — Other Ambulatory Visit (INDEPENDENT_AMBULATORY_CARE_PROVIDER_SITE_OTHER): Payer: Medicare Other

## 2016-02-24 ENCOUNTER — Ambulatory Visit (INDEPENDENT_AMBULATORY_CARE_PROVIDER_SITE_OTHER): Payer: Medicare Other | Admitting: Pulmonary Disease

## 2016-02-24 VITALS — BP 126/72 | HR 63 | Ht 63.5 in | Wt 220.4 lb

## 2016-02-24 DIAGNOSIS — R0602 Shortness of breath: Secondary | ICD-10-CM | POA: Diagnosis not present

## 2016-02-24 DIAGNOSIS — J849 Interstitial pulmonary disease, unspecified: Secondary | ICD-10-CM | POA: Diagnosis not present

## 2016-02-24 LAB — SEDIMENTATION RATE: SED RATE: 34 mm/h — AB (ref 0–30)

## 2016-02-24 NOTE — Progress Notes (Signed)
Lisa Mcclure    [id number]    Dec 12, 1932  Primary Care Physician:PATERSON,DANIEL G, MD  Referring Physician: [name], MD [address] [place], [place] [address]  Chief complaint:  Consult for evaluation of dyspnea  HPI: 82 Y/O with obesity, chronic diastolic CHF, hypertensive heart disease, HTN, anemia, hemorrhoids, depression, diverticulosis, esophageal reflux, LBP, OAB, OSA, rheumatoid arthritis, venous insufficiency. She was seen in 2010 for a right upper lobe para mediastinal that resolved with antibiotics. This was thought to be secondary to an infection. She was also evaluated for rheumatoid arthritis but with negative CCP and RF. She saw Dr. Amil Amen who did not think she has RA.  She follows up with cardiology for worsening edema, dyspnea. She had a fall earlier this year with a shoulder fracture. She feels that this set her back significantly  in terms of her activity level, dyspnea and is slowly recovering from this incident. Echo noted with high ventricular filling pressures, elevated PA pressures, diastolic dysfunction, mild aortic stenosis. She was tried on diuretics but had cut back due to elevated Cr. A right and left heart cath has been suggested but she would like to get her lungs evaluated first. She had recent PFTs that show some mild restriction withDLCO impairment. She has tried breo with no improvement in symptoms. She was diagnosed with sleep apnea in 2012 but did not tolerate CPAP. She is just on supplemental oxygen at night.  Outpatient Encounter Prescriptions as of 02/24/2016  Medication Sig  . busPIRone (BUSPAR) 7.5 MG tablet Take 1 tablet (7.5 mg total) by mouth 3 (three) times daily. (Patient taking differently: Take 7.5 mg by mouth daily. )  . cholecalciferol (VITAMIN D) 1000 UNITS tablet Take 1,000 Units by mouth daily.  . furosemide (LASIX) 20 MG tablet Take 1 tablet (20 mg total) by mouth daily.  . hydrALAZINE (APRESOLINE) 25 MG tablet  Take 1 tablet (25 mg total) by mouth 3 (three) times daily.  Marland Kitchen HYDROcodone-acetaminophen (NORCO) 7.5-325 MG tablet Take 1 tablet by mouth every 6 (six) hours as needed for moderate pain.  Marland Kitchen latanoprost (XALATAN) 0.005 % ophthalmic solution Place 1 drop into both eyes at bedtime.  Marland Kitchen levothyroxine (SYNTHROID, LEVOTHROID) 100 MCG tablet Take 100 mcg by mouth daily before breakfast.  . losartan (COZAAR) 100 MG tablet Take 100 mg by mouth daily.  . Melatonin 3 MG TABS Take 3 mg by mouth at bedtime.   . metoprolol (LOPRESSOR) 50 MG tablet Take 50 mg by mouth 2 (two) times daily.  . Multiple Vitamin (MULTI-DAY VITAMINS PO) Take 1 tablet by mouth daily.   . Multiple Vitamins-Minerals (ICAPS AREDS 2) CAPS Take 1 capsule by mouth 2 (two) times daily.   Marland Kitchen omeprazole (PRILOSEC) 20 MG capsule Take 20 mg by mouth 2 (two) times daily before a meal.   . polyethylene glycol (MIRALAX / GLYCOLAX) packet Take 17 g by mouth daily as needed for moderate constipation.   . potassium chloride SA (K-DUR,KLOR-CON) 20 MEQ tablet Take 1 tablet (20 mEq total) by mouth daily.  Marland Kitchen rOPINIRole (REQUIP) 1 MG tablet TAKE 1 TABLET BY MOUTH EVERY EVENING FOR RESTLESS LEGS  . sennosides-docusate sodium (SENOKOT-S) 8.6-50 MG tablet Take 1 tablet by mouth daily. (Patient taking differently: Take 1 tablet by mouth daily as needed. )  . spironolactone (ALDACTONE) 25 MG tablet Take 1 tablet (25 mg total) by mouth daily.  . [DISCONTINUED] Multiple Vitamins-Minerals (VITEYES AREDS FORMULA PO) Take 4 capsules by mouth daily.  No facility-administered encounter medications on file as of 02/24/2016.     Allergies as of 02/24/2016 - Review Complete 02/24/2016  Allergen Reaction Noted  . Gabapentin  02/24/2016  . Robaxin [methocarbamol]  02/24/2016  . Statins  02/24/2016    Past Medical History:  Diagnosis Date  . Anemia    h/o hemorrhoidal bleeding and blood transfusion  . Cardiomegaly   . Chronic diastolic CHF (congestive heart  failure) ([facility])   . Depression   . Diverticulosis   . DOE (dyspnea on exertion)   . Esophageal reflux   . GERD (gastroesophageal reflux disease)   . Hypothyroidism   . Insomnia   . LBP (low back pain)   . OAB (overactive bladder)   . Obesity   . OSA (obstructive sleep apnea)   . Osteoarthritis   . Rheumatoid arthritis(714.0)   . Shoulder pain, bilateral   . Unspecified essential hypertension   . Venous insufficiency     Past Surgical History:  Procedure Laterality Date  . APPENDECTOMY  1953  . bladder abduction-1996  1996  . breast biopsy Right 1980  . [place]  . COSMETIC SURGERY  1996  . CYSTOCELE REPAIR    . FLEXIBLE SIGMOIDOSCOPY N/A 04/10/2015   Procedure: FLEXIBLE SIGMOIDOSCOPY;  Surgeon: Arta Silence, MD;  Location: [facility] ENDOSCOPY;  Service: Endoscopy;  Laterality: N/A;  . knee arthroscopy Right 1996, 2010  . KNEE ARTHROSCOPY W/ AUTOGENOUS CARTILAGE IMPLANTATION (ACI) PROCEDURE Left 1994, 1995  . SHOULDER SURGERY  1990  . TOTAL ABDOMINAL HYSTERECTOMY  1972  . VESICOVAGINAL FISTULA CLOSURE W/ TAH    . WRIST SURGERY  1967    Family History  Problem Relation Age of Onset  . COPD Father   . Lung cancer Mother   . Heart attack Maternal Grandmother 80  . Breast cancer Maternal Aunt   . Lung cancer Son     Social History   Social History  . Marital status: Widowed    Spouse name: N/A  . Number of children: 3  . Years of education: College   Occupational History  . Retired    Social History Main Topics  . Smoking status: Former Smoker    Packs/day: 0.20    Years: 10.00    Types: Cigarettes    Quit date: 04/11/1984  . Smokeless tobacco: Never Used  . Alcohol use 0.0 oz/week     Comment: cocktail daily  . Drug use: No  . Sexual activity: Not on file   Other Topics Concern  . 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  HENT: Negative.   Eyes: Negative for blurred vision.  Respiratory: as per HPI  Cardiovascular: Negative for chest pain and palpitations.  Gastrointestinal: Negative for vomiting, diarrhea, blood per rectum. Genitourinary: Negative for dysuria, urgency, frequency and hematuria.  Musculoskeletal: Negative for myalgias, back pain and joint pain.  Skin: Negative for itching and rash.  Neurological: Negative for dizziness, tremors, focal weakness, seizures and loss of consciousness.  Endo/Heme/Allergies: Negative for environmental allergies.  Psychiatric/Behavioral: Negative for depression, suicidal ideas and hallucinations.  All other systems reviewed and  are negative.   Physical Exam: Blood pressure 126/72, pulse 63, height 5' 3.5" (1.613 m), weight 220 lb 6.4 oz (100 kg), SpO2 95 %. Gen:      No acute distress HEENT:  EOMI, sclera anicteric Neck:     No masses; no thyromegaly Lungs:    Clear to auscultation bilaterally; normal respiratory effort CV:         Regular rate and rhythm; no murmurs Abd:      + bowel sounds; soft, non-tender; no palpable masses, no distension Ext:    No edema; adequate peripheral perfusion Skin:      Warm and dry; no rash Neuro: alert and oriented x 3 Psych: normal mood and affect  Data Reviewed: Chest x-ray 01/28/16-no active Pulmonary disease. Mild chronic interstitial changes. CT chest 01/17/11-mild residual scarring in the right upper lobe. CT chest 09/29/08-resolution of the right upper lobe paramediastinal mass. PET scan 09/02/08- Moderate uptake CT chest 08/25/08-right upper lobe paramediastinal mass. All images reviewed  Sleep test 09/22/15. - OSA,  AHI 44.7, and PLMI 23.2.  Echo 79/0/38 Normal LV systolic function; moderate LVH; grade 1 distolic dysfunction  with elevated LV filling pressure; calcified aortic  valve with mild AS (mean gradient 13 mmHg); moderate LAE; trace  TR with mildly elevated pulmonary pressure.  PFTs 02/11/16 FVC 1.78 (72%] FEV1 1.45 [at 9%) F/F 81 TLC 76% DLCO 65%. Minimal restriction, mild diffusion defect.  Assessment:  #1 Evaluation for dyspnea All her imaging and PFTs reviewed. There is very mild restriction, diffusion reduction with no obstruction. I'm not certain there is significant component of pulmonary impairment that is causing her symptoms.  Her symptoms are likely multifactorial from obesity, deconditioning. There is a suggestion of small airway disease with reduction in midflow rates and improvement after albuterol. I have asked her to continue the [facility] even though she has not seen any difference in her symptoms.  She does have a question of rheumatoid arthritis with interstitial prominence on recent chest x-ray. I'll evaluate with a high-resolution CT of the chest and repeat serologies for rheumatoid arthritis and ILD.   She'll return to clinic within 1 month to review results and plan for further workup as needed.  #2 OSA Non compliant with CPAP. On supplemental O2 at night.  Plan/Recommendations: - High res CT - Check CCP, RF, ESR.  Marshell Garfinkel MD [facility] Pager [phone] 02/24/2016, 4:39 PM  CC: [name], MD

## 2016-02-24 NOTE — Patient Instructions (Signed)
Scheduled for a high-resolution CT of the chest. We will some blood work today including sedimentation rate, CCP, rheumatoid factor.  Return after CT of chest.

## 2016-02-25 LAB — ANA W/REFLEX: Anti Nuclear Antibody(ANA): NEGATIVE

## 2016-02-25 LAB — RHEUMATOID FACTOR

## 2016-02-25 LAB — ANCA SCREEN W REFLEX TITER: ANCA Screen: NEGATIVE

## 2016-02-25 LAB — CYCLIC CITRUL PEPTIDE ANTIBODY, IGG

## 2016-03-01 DIAGNOSIS — E038 Other specified hypothyroidism: Secondary | ICD-10-CM | POA: Diagnosis not present

## 2016-03-01 DIAGNOSIS — I10 Essential (primary) hypertension: Secondary | ICD-10-CM | POA: Diagnosis not present

## 2016-03-17 ENCOUNTER — Ambulatory Visit (INDEPENDENT_AMBULATORY_CARE_PROVIDER_SITE_OTHER)
Admission: RE | Admit: 2016-03-17 | Discharge: 2016-03-17 | Disposition: A | Discharge status: Home / Self Care | Payer: Medicare Other | Source: Ambulatory Visit | Attending: Pulmonary Disease | Admitting: Pulmonary Disease

## 2016-03-17 DIAGNOSIS — J849 Interstitial pulmonary disease, unspecified: Secondary | ICD-10-CM

## 2016-03-17 DIAGNOSIS — R0602 Shortness of breath: Secondary | ICD-10-CM | POA: Diagnosis not present

## 2016-03-21 ENCOUNTER — Encounter: Payer: Self-pay | Admitting: Pulmonary Disease

## 2016-03-21 ENCOUNTER — Ambulatory Visit (INDEPENDENT_AMBULATORY_CARE_PROVIDER_SITE_OTHER): Payer: Medicare Other | Admitting: Pulmonary Disease

## 2016-03-21 VITALS — BP 122/72 | HR 61 | Ht 63.0 in | Wt 216.8 lb

## 2016-03-21 DIAGNOSIS — R0602 Shortness of breath: Secondary | ICD-10-CM | POA: Diagnosis not present

## 2016-03-21 MED ORDER — FLUTICASONE FUROATE-VILANTEROL 100-25 MCG/INH IN AEPB: 1.0000 | INHALATION_SPRAY | Freq: Every day | RESPIRATORY_TRACT | 2 refills | Status: DC

## 2016-03-21 NOTE — Patient Instructions (Signed)
Continue using your breo Follow-up in clinic in 6 months.

## 2016-03-21 NOTE — Progress Notes (Signed)
Lisa Mcclure    [id number]    Apr 12, 1932  Primary Care Physician:PATERSON,DANIEL G, MD  Referring Physician: [name], MD [address] [place], [place] [address]  Chief complaint:  Follow up for evaluation of dyspnea  HPI: 82 Y/O with obesity, chronic diastolic CHF, hypertensive heart disease, HTN, anemia, hemorrhoids, depression, diverticulosis, esophageal reflux, LBP, OAB, OSA, rheumatoid arthritis, venous insufficiency. She was seen in 2010 for a right upper lobe para mediastinal that resolved with antibiotics. This was thought to be secondary to an infection. She was also evaluated for rheumatoid arthritis but with negative CCP and RF. She saw Dr. Amil Amen who did not think she has RA.  She follows up with cardiology for worsening edema, dyspnea. She had a fall earlier this year with a shoulder fracture. She feels that this set her back significantly  in terms of her activity level, dyspnea and is slowly recovering from this incident. Echo noted with high ventricular filling pressures, elevated PA pressures, diastolic dysfunction, mild aortic stenosis. She was tried on diuretics but had cut back due to elevated Cr. A right and left heart cath has been suggested but she would like to get her lungs evaluated first. She had recent PFTs that show some mild restriction with DLCO impairment. She has tried breo with no improvement in symptoms. She was diagnosed with sleep apnea in 2012 but did not tolerate CPAP. She is just on supplemental oxygen at night.  Outpatient Encounter Prescriptions as of 03/21/2016  Medication Sig  . busPIRone (BUSPAR) 7.5 MG tablet Take 1 tablet (7.5 mg total) by mouth 3 (three) times daily. (Patient taking differently: Take 7.5 mg by mouth daily. )  . cholecalciferol (VITAMIN D) 1000 UNITS tablet Take 1,000 Units by mouth daily.  . furosemide (LASIX) 20 MG tablet Take 1 tablet (20 mg total) by mouth daily.  . hydrALAZINE (APRESOLINE) 25 MG tablet  Take 1 tablet (25 mg total) by mouth 3 (three) times daily.  Marland Kitchen HYDROcodone-acetaminophen (NORCO) 7.5-325 MG tablet Take 1 tablet by mouth every 6 (six) hours as needed for moderate pain.  Marland Kitchen latanoprost (XALATAN) 0.005 % ophthalmic solution Place 1 drop into both eyes at bedtime.  Marland Kitchen levothyroxine (SYNTHROID, LEVOTHROID) 100 MCG tablet Take 100 mcg by mouth daily before breakfast.  . losartan (COZAAR) 100 MG tablet Take 100 mg by mouth daily.  . Melatonin 3 MG TABS Take 3 mg by mouth at bedtime.   . metoprolol (LOPRESSOR) 50 MG tablet Take 50 mg by mouth 2 (two) times daily.  . Multiple Vitamin (MULTI-DAY VITAMINS PO) Take 1 tablet by mouth daily.   . Multiple Vitamins-Minerals (ICAPS AREDS 2) CAPS Take 1 capsule by mouth 2 (two) times daily.   Marland Kitchen omeprazole (PRILOSEC) 20 MG capsule Take 20 mg by mouth 2 (two) times daily before a meal.   . polyethylene glycol (MIRALAX / GLYCOLAX) packet Take 17 g by mouth daily as needed for moderate constipation.   . potassium chloride SA (K-DUR,KLOR-CON) 20 MEQ tablet Take 1 tablet (20 mEq total) by mouth daily.  Marland Kitchen rOPINIRole (REQUIP) 1 MG tablet TAKE 1 TABLET BY MOUTH EVERY EVENING FOR RESTLESS LEGS  . sennosides-docusate sodium (SENOKOT-S) 8.6-50 MG tablet Take 1 tablet by mouth daily. (Patient taking differently: Take 1 tablet by mouth daily as needed. )  . spironolactone (ALDACTONE) 25 MG tablet Take 1 tablet (25 mg total) by mouth daily.   No facility-administered encounter medications on file as of 03/21/2016.  Allergies as of 03/21/2016 - Review Complete 03/21/2016  Allergen Reaction Noted  . Gabapentin  02/24/2016  . Robaxin [methocarbamol]  02/24/2016  . Statins  02/24/2016    Past Medical History:  Diagnosis Date  . Anemia    h/o hemorrhoidal bleeding and blood transfusion  . Cardiomegaly   . Chronic diastolic CHF (congestive heart failure) ([place])   . Depression   . Diverticulosis   . DOE (dyspnea on exertion)   . Esophageal reflux   .  GERD (gastroesophageal reflux disease)   . Hypothyroidism   . Insomnia   . LBP (low back pain)   . OAB (overactive bladder)   . Obesity   . OSA (obstructive sleep apnea)   . Osteoarthritis   . Rheumatoid arthritis(714.0)   . Shoulder pain, bilateral   . Unspecified essential hypertension   . Venous insufficiency     Past Surgical History:  Procedure Laterality Date  . APPENDECTOMY  1953  . bladder abduction-1996  1996  . breast biopsy Right 1980  . [place]  . COSMETIC SURGERY  1996  . CYSTOCELE REPAIR    . FLEXIBLE SIGMOIDOSCOPY N/A 04/10/2015   Procedure: FLEXIBLE SIGMOIDOSCOPY;  Surgeon: Arta Silence, MD;  Location: [facility] ENDOSCOPY;  Service: Endoscopy;  Laterality: N/A;  . knee arthroscopy Right 1996, 2010  . KNEE ARTHROSCOPY W/ AUTOGENOUS CARTILAGE IMPLANTATION (ACI) PROCEDURE Left 1994, 1995  . SHOULDER SURGERY  1990  . TOTAL ABDOMINAL HYSTERECTOMY  1972  . VESICOVAGINAL FISTULA CLOSURE W/ TAH    . WRIST SURGERY  1967    Family History  Problem Relation Age of Onset  . COPD Father   . Lung cancer Mother   . Heart attack Maternal Grandmother 80  . Breast cancer Maternal Aunt   . Lung cancer Son     Social History   Social History  . Marital status: Widowed    Spouse name: N/A  . Number of children: 3  . Years of education: College   Occupational History  . Retired    Social History Main Topics  . Smoking status: Former Smoker    Packs/day: 0.20    Years: 10.00    Types: Cigarettes    Quit date: 04/11/1984  . Smokeless tobacco: Never Used  . Alcohol use 0.0 oz/week     Comment: cocktail daily  . Drug use: No  . Sexual activity: Not on file   Other Topics Concern  . 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  HENT: Negative.   Eyes: Negative for blurred vision.  Respiratory: as per HPI  Cardiovascular: Negative for chest pain and palpitations.  Gastrointestinal: Negative for vomiting, diarrhea, blood per rectum. Genitourinary: Negative for dysuria, urgency, frequency and hematuria.  Musculoskeletal: Negative for myalgias, back pain and joint pain.  Skin: Negative for itching and rash.  Neurological: Negative for dizziness, tremors, focal weakness, seizures and loss of consciousness.  Endo/Heme/Allergies: Negative for environmental allergies.  Psychiatric/Behavioral: Negative for depression, suicidal ideas and hallucinations.  All other systems reviewed and are negative.   Physical Exam: Blood pressure 126/72, pulse 63, height 5'  3.5" (1.613 m), weight 220 lb 6.4 oz (100 kg), SpO2 95 %. Gen:      No acute distress HEENT:  EOMI, sclera anicteric Neck:     No masses; no thyromegaly Lungs:    Clear to auscultation bilaterally; normal respiratory effort CV:         Regular rate and rhythm; no murmurs Abd:      + bowel sounds; soft, non-tender; no palpable masses, no distension Ext:    No edema; adequate peripheral perfusion Skin:      Warm and dry; no rash Neuro: alert and oriented x 3 Psych: normal mood and affect  Data Reviewed: CT high res 03/17/16- no definite evidence for interstitial lung disease. Coronary calcification, atherosclerosis. Chest x-ray 01/28/16-no active Pulmonary disease. Mild chronic interstitial changes. CT chest 01/17/11-mild residual scarring in the right upper lobe. CT chest 09/29/08-resolution of the right upper lobe paramediastinal mass. PET scan 09/02/08- Moderate uptake CT chest 08/25/08-right upper lobe paramediastinal mass. All images reviewed  Sleep test 09/22/15. - OSA,  AHI 44.7, and PLMI 23.2.  Echo AB-[id number] Normal LV systolic function; moderate LVH; grade 1 distolic dysfunction  with elevated LV filling pressure; calcified aortic  valve with mild AS (mean gradient 13 mmHg); moderate LAE; trace  TR with mildly elevated pulmonary pressure.  PFTs 02/11/16 FVC 1.78 (72%] FEV1 1.45 [79%) F/F 81 TLC 76% DLCO 65%. Minimal restriction, mild diffusion defect.  Assessment:  #1 Evaluation for dyspnea All her imaging and PFTs reviewed. There is very mild restriction, diffusion reduction with no obstruction. I'm not certain there is significant component of pulmonary impairment that is causing her symptoms.  Her symptoms are likely multifactorial from obesity, deconditioning. There is a suggestion of small airway disease with reduction in midflow rates and improvement after albuterol. I have asked her to continue the [facility].  She does have a question of rheumatoid arthritis with interstitial prominence on recent chest x-ray. However high-resolution CT of the chest does not show any ILD and repeat serologies for rheumatoid arthritis are negative.   She wants my opinion about getting the right heart and left heart cath. Since is no obvious pulmonary abnormality I told her it would be reasonable to proceed with cath, especially as she has coronary atherosclerosis and calcification on her CT scan. She will discuss this further with her cardiologist.   #2 OSA Non compliant with CPAP. On supplemental O2 at night.  Plan/Recommendations: - Continue Breo  Return to clinic in 6 months  Marshell Garfinkel MD [facility] Pager [phone] 03/21/2016, 4:32 PM  CC: [name], MD

## 2016-03-23 ENCOUNTER — Ambulatory Visit: Payer: Medicare Other | Admitting: Physician Assistant

## 2016-03-25 DIAGNOSIS — E038 Other specified hypothyroidism: Secondary | ICD-10-CM | POA: Diagnosis not present

## 2016-03-25 DIAGNOSIS — M545 Low back pain: Secondary | ICD-10-CM | POA: Diagnosis not present

## 2016-03-25 DIAGNOSIS — I10 Essential (primary) hypertension: Secondary | ICD-10-CM | POA: Diagnosis not present

## 2016-03-25 DIAGNOSIS — Z0000 Encounter for general adult medical examination without abnormal findings: Secondary | ICD-10-CM | POA: Diagnosis not present

## 2016-03-25 DIAGNOSIS — R2689 Other abnormalities of gait and mobility: Secondary | ICD-10-CM | POA: Diagnosis not present

## 2016-03-25 DIAGNOSIS — I5032 Chronic diastolic (congestive) heart failure: Secondary | ICD-10-CM | POA: Diagnosis not present

## 2016-03-25 DIAGNOSIS — G4733 Obstructive sleep apnea (adult) (pediatric): Secondary | ICD-10-CM | POA: Diagnosis not present

## 2016-03-25 DIAGNOSIS — Z6837 Body mass index (BMI) 37.0-37.9, adult: Secondary | ICD-10-CM | POA: Diagnosis not present

## 2016-03-25 DIAGNOSIS — G2581 Restless legs syndrome: Secondary | ICD-10-CM | POA: Diagnosis not present

## 2016-03-25 DIAGNOSIS — E668 Other obesity: Secondary | ICD-10-CM | POA: Diagnosis not present

## 2016-03-27 NOTE — Progress Notes (Signed)
Cardiology Office Note    Date:  03/29/2016   ID:  Lisa Mcclure, DOB Oct 24, 1932, MRN [id number]  PCP:  [name], MD  Cardiologist:  Dr. Meda Coffee   CC: follow up  History of Present Illness:  Lisa Mcclure is a 82 y.o. female with a history of obesity, chronic diastolic CHF, hypertensive heart disease, HTN, anemia, hemorrhoids, depression, diverticulosis, esophageal reflux, LBP, OAB, OSA intolerant to CPAP on nocturnal 02, rheumatoid arthritis, and venous insufficiency who presents for follow-up of persistent dyspnea.   At [place] 01/14/16 with Dr. Meda Coffee, she was reporting worsening edema, DOE and chest tightness in setting of noncompliance with diuretic due to history bladder surgery and reported overflow incontinence. Dr. Meda Coffee recommended she try Lasix 80mg  daily and spironolactone, along with metolazone daily for 1 week. 2D Echo 01/14/16: mod LVH, EF 55-60%, grade 1 DD, high vent filling pressure, mild aortic stenosis, mod dilated LA, increased thickeness of septum, PASP 6mmHg. BMET on 01/22/16 showed K 4.7, BUN 48, Cr 1.23 (increased from 0.81 on 12/15/15), BNP normal. She was told to hold metolazone starting 10/13. On 01/26/16 she was advised to decrease Lasix to 40mg  daily but she had reported terrible leg cramps so was told not to take any more diuretics pending further follow-up. At her OV with Melina Copa PA-C on 01/28/16, she said she did not feel any different with the high dose diuretics compared to off the diuretic. Given her persistent symptoms, Dayna Dunn PA-C discussed R/L cardiac cath with her and her daughter in depth. Dr. Meda Coffee agreed. The patient requested an additional office visit to further discuss cardiac cath. Her repeat labs on 01/29/16 showed Cr improvement to 0.98 thus Dayna recommended to restart Lasix at 20mg  daily to see if we could find at least some compromise of diuretic therapy she could tolerate. D-dimer was negative and CXR was unremarkable, negative for  any fluid.  She saw Dayna again in the office on 10/27 to discuss cath. She expressed dissatisfaction with this recommendation and reported that in the past she had a ground-glass abnormality on chest imaging and wanted to pursue pulm f/u first. She has seen Dr. Vaughan Browner with PCCM. PFTs showed some mild restriction with DLCO impairment. She tried breo with no improvement in symptoms. There was also a question of rheumatoid arthritis with interstitial prominence on recent chest x-ray. However high-resolution CT of the chest did not show any ILD and repeat serologies for rheumatoid arthritis were negative. Dr. Vaughan Browner does not believe she has a pulmonary cause of her dyspnea and that her sx are likely multifactorial from obesity and deconditioning. He did agree that [facility] may be appropriate given unclear cause of dyspnea.   Today she presents to clinic for follow up. She woke up yesterday with a cough and general malaise. Coughing up some yellow sputum. Upset no one told her that she needs to loose weight to help breathing. Also upset no one told her she needed a stress test as both her PCP and Dr. Vaughan Browner said she needed one. Breathing is basically the same. No chest pain. Just feels tired and upset with her general help. Wants to start exercising but has knee pain and is unsteady and scared to fall. No LE edema, orthopnea or PND. No dizziness or syncope.     Past Medical History:  Diagnosis Date  . Anemia    h/o hemorrhoidal bleeding and blood transfusion  . Cardiomegaly   . Chronic diastolic CHF (congestive heart failure) ([place])   .  Depression   . Diverticulosis   . DOE (dyspnea on exertion)   . Esophageal reflux   . GERD (gastroesophageal reflux disease)   . Hypothyroidism   . Insomnia   . LBP (low back pain)   . OAB (overactive bladder)   . Obesity   . OSA (obstructive sleep apnea)   . Osteoarthritis   . Rheumatoid arthritis(714.0)   . Shoulder pain, bilateral   . Unspecified essential  hypertension   . Venous insufficiency     Past Surgical History:  Procedure Laterality Date  . APPENDECTOMY  1953  . bladder abduction-1996  1996  . breast biopsy Right 1980  . [place]  . COSMETIC SURGERY  1996  . CYSTOCELE REPAIR    . FLEXIBLE SIGMOIDOSCOPY N/A 04/10/2015   Procedure: FLEXIBLE SIGMOIDOSCOPY;  Surgeon: Arta Silence, MD;  Location: [facility] ENDOSCOPY;  Service: Endoscopy;  Laterality: N/A;  . knee arthroscopy Right 1996, 2010  . KNEE ARTHROSCOPY W/ AUTOGENOUS CARTILAGE IMPLANTATION (ACI) PROCEDURE Left 1994, 1995  . SHOULDER SURGERY  1990  . TOTAL ABDOMINAL HYSTERECTOMY  1972  . VESICOVAGINAL FISTULA CLOSURE W/ TAH    . WRIST SURGERY  1967    Current Medications: Outpatient Medications Prior to Visit  Medication Sig Dispense Refill  . busPIRone (BUSPAR) 7.5 MG tablet Take 1 tablet (7.5 mg total) by mouth 3 (three) times daily. (Patient taking differently: Take 7.5 mg by mouth daily. ) 20 tablet 0  . cholecalciferol (VITAMIN D) 1000 UNITS tablet Take 1,000 Units by mouth daily.    . fluticasone furoate-vilanterol (BREO ELLIPTA) 100-25 MCG/INH AEPB Inhale 1 puff into the lungs daily. 60 each 2  . furosemide (LASIX) 20 MG tablet Take 1 tablet (20 mg total) by mouth daily. 30 tablet   . hydrALAZINE (APRESOLINE) 25 MG tablet Take 1 tablet (25 mg total) by mouth 3 (three) times daily. 270 tablet 3  . HYDROcodone-acetaminophen (NORCO) 7.5-325 MG tablet Take 1 tablet by mouth every 6 (six) hours as needed for moderate pain. 20 tablet 0  . latanoprost (XALATAN) 0.005 % ophthalmic solution Place 1 drop into both eyes at bedtime.    Marland Kitchen levothyroxine (SYNTHROID, LEVOTHROID) 100 MCG tablet Take 100 mcg by mouth daily before breakfast.    . losartan (COZAAR) 100 MG tablet Take 100 mg by mouth daily.    . Melatonin 3 MG TABS Take 3 mg by mouth at bedtime.     . metoprolol (LOPRESSOR) 50 MG tablet Take 50 mg by mouth 2 (two) times daily.    . Multiple  Vitamin (MULTI-DAY VITAMINS PO) Take 1 tablet by mouth daily.     . Multiple Vitamins-Minerals (ICAPS AREDS 2) CAPS Take 1 capsule by mouth 2 (two) times daily.     Marland Kitchen omeprazole (PRILOSEC) 20 MG capsule Take 20 mg by mouth 2 (two) times daily before a meal.     . potassium chloride SA (K-DUR,KLOR-CON) 20 MEQ tablet Take 1 tablet (20 mEq total) by mouth daily. 90 tablet 3  . rOPINIRole (REQUIP) 1 MG tablet TAKE 1 TABLET BY MOUTH EVERY EVENING FOR RESTLESS LEGS  12  . sennosides-docusate sodium (SENOKOT-S) 8.6-50 MG tablet Take 1 tablet by mouth daily. (Patient taking differently: Take 1 tablet by mouth daily as needed. )    . spironolactone (ALDACTONE) 25 MG tablet Take 1 tablet (25 mg total) by mouth daily. 90 tablet 3  . polyethylene glycol (MIRALAX / GLYCOLAX) packet Take 17 g by mouth daily  as needed for moderate constipation.      No facility-administered medications prior to visit.      Allergies:   Gabapentin; Robaxin [methocarbamol]; and Statins   Social History   Social History  . Marital status: Widowed    Spouse name: N/A  . Number of children: 3  . Years of education: College   Occupational History  . Retired    Social History Main Topics  . Smoking status: Former Smoker    Packs/day: 0.20    Years: 10.00    Types: Cigarettes    Quit date: 04/11/1984  . Smokeless tobacco: Never Used  . Alcohol use 0.0 oz/week     Comment: cocktail daily  . Drug use: No  . Sexual activity: Not Asked   Other Topics Concern  . 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     ROS:   Please  see the history of present illness.    ROS All other systems reviewed and are negative.   PHYSICAL EXAM:   VS:  BP (!) 158/86   Pulse 63   Ht 5\' 3"  (1.6 m)   Wt 217 lb 12.8 oz (98.8 kg)   SpO2 93%   BMI 38.58 kg/m    GEN: Well nourished, well developed, in no acute distress obese HEENT: normal  Neck: no JVD, carotid bruits, or masses Cardiac:RRR; no murmurs, rubs, or gallops,no edema  Respiratory:  clear to auscultation bilaterally, normal work of breathing GI: soft, nontender, nondistended, + BS MS: no deformity or atrophy  Skin: warm and dry, no rash Neuro:  Alert and Oriented x 3, Strength and sensation are intact Psych: euthymic mood, full affect  Wt Readings from Last 3 Encounters:  03/29/16 217 lb 12.8 oz (98.8 kg)  03/21/16 216 lb 12.8 oz (98.3 kg)  02/24/16 220 lb 6.4 oz (100 kg)      Studies/Labs Reviewed:   EKG:  EKG is NOT ordered today.    Recent Labs: 04/20/2015: TSH 2.373 01/22/2016: Brain Natriuretic Peptide 96.8 01/28/2016: Hemoglobin 12.1; Platelets 335 01/29/2016: ALT 6; BUN 21; Creat 0.98; Potassium 5.0; Sodium 140   Lipid Panel    Component Value Date/Time   CHOL 226 (HH) 12/19/2006 0916   TRIG 132 12/19/2006 0916   HDL 43.9 12/19/2006 0916   CHOLHDL 5.1 CALC 12/19/2006 0916   VLDL 26 12/19/2006 0916   LDLDIRECT 159.2 12/19/2006 0916    Additional studies/ records that were reviewed today include:  Outlined in HPI   ASSESSMENT & PLAN:  Lisa Mcclure is a 82 y.o. female with a history of obesity, chronic diastolic CHF, hypertensive heart disease, HTN, anemia, hemorrhoids, depression, diverticulosis, esophageal reflux, LBP, OAB, OSA intolerant to CPAP on nocturnal 02, rheumatoid arthritis, and venous insufficiency who presents for follow-up of persistent dyspnea.   Dyspnea: she has failed diuretic therapy. ECHO showed elevated filling pressures but BNP normal. Cannot tolerate high doses of diuretics. She has seen pulmonology. PFTs showed  some mild restriction with DLCO impairment. She tried breo with no  improvement in symptoms. There was also a question of rheumatoid arthritis with interstitial prominence on recent chest x-ray. However high-resolution CT of the chest did not show any ILD and repeat serologies for rheumatoid arthritis were negative. Dr. Vaughan Browner does not believe she has a pulmonary cause of her dyspnea and that her sx are likely multifactorial from obesity and deconditioning. Patient is scared of heart caths because her friend died having one. She would like to start with a stress test and try diet and exercise, which I think is reasonable.   Chronic diastolic CHF/hypertensive heart disease: continue lasix 20mg  daily and spiro 25mg  daily. Recommended low sodium diet <2g per day, fluid restriction <2L per day and daily weights.   Morbid Obesity: weight likely contributing to dyspnea. Diet and exercise recommended.   Essential HTN: BP well controlled currently   Cough: she is very worried she has bronchitis. Will Rx a Z-Pac  Medication Adjustments/Labs and Tests Ordered: Current medicines are reviewed at length with the patient today.  Concerns regarding medicines are outlined above.  Medication changes, Labs and Tests ordered today are listed in the Patient Instructions below. Patient Instructions  Medication Instructions:  Your physician has recommended you make the following change in your medication:  1.  START the ZPak as directed on the box   Labwork: None ordered  Testing/Procedures: Your physician has requested that you have a lexiscan myoview. For further information please visit [email or web address]. Please follow instruction sheet, as given.    Follow-Up: Your physician recommends that you schedule a follow-up appointment in: [place]   Any Other Special Instructions Will Be Listed Below (If Applicable). Pharmacologic Stress Electrocardiogram A pharmacologic stress  electrocardiogram is a heart (cardiac) test that uses nuclear imaging to evaluate the blood supply to your heart. This test may also be called a pharmacologic stress electrocardiography. Pharmacologic means that a medicine is used to increase your heart rate and blood pressure.  This stress test is done to find areas of poor blood flow to the heart by determining the extent of coronary artery disease (CAD). Some people exercise on a treadmill, which naturally increases the blood flow to the heart. For those people unable to exercise on a treadmill, a medicine is used. This medicine stimulates your heart and will cause your heart to beat harder and more quickly, as if you were exercising.  Pharmacologic stress tests can help determine:  The adequacy of blood flow to your heart during increased levels of activity in order to clear you for discharge home.  The extent of coronary artery blockage caused by CAD.  Your prognosis if you have suffered a heart attack.  The effectiveness of cardiac procedures done, such as an angioplasty, which can increase the circulation in your coronary arteries.  Causes of chest pain or pressure. LET [facility] CARE PROVIDER KNOW ABOUT:  Any allergies you have.  All medicines you are taking, including vitamins, herbs, eye drops, creams, and over-the-counter medicines.  Previous problems you or members of your family have had with the use of anesthetics.  Any blood disorders you have.  Previous surgeries you have had.  Medical conditions you have.  Possibility of pregnancy, if this applies.  If you are currently breastfeeding. RISKS AND COMPLICATIONS Generally, this is a safe procedure. However, as with any procedure, complications can occur. Possible complications include:  You develop pain or pressure in the following areas:  Chest.  Jaw or neck.  Between your shoulder blades.  Radiating  down your left arm.  Headache.  Dizziness or  light-headedness.  Shortness of breath.  Increased or irregular heartbeat.  Low blood pressure.  Nausea or vomiting.  Flushing.  Redness going up the arm and slight pain during injection of medicine.  Heart attack (rare). BEFORE THE PROCEDURE   Avoid all forms of caffeine for 24 hours before your test or as directed by your health care provider. This includes coffee, tea (even decaffeinated tea), caffeinated sodas, chocolate, cocoa, and certain pain medicines.  Follow your health care provider's instructions regarding eating and drinking before the test.  Take your medicines as directed at regular times with water unless instructed otherwise. Exceptions may include:  If you have diabetes, ask how you are to take your insulin or pills. It is common to adjust insulin dosing the morning of the test.  If you are taking beta-blocker medicines, it is important to talk to your health care provider about these medicines well before the date of your test. Taking beta-blocker medicines may interfere with the test. In some cases, these medicines need to be changed or stopped 24 hours or more before the test.  If you wear a nitroglycerin patch, it may need to be removed prior to the test. Ask your health care provider if the patch should be removed before the test.  If you use an inhaler for any breathing condition, bring it with you to the test.  If you are an outpatient, bring a snack so you can eat right after the stress phase of the test.  Do not smoke for 4 hours prior to the test or as directed by your health care provider.  Do not apply lotions, powders, creams, or oils on your chest prior to the test.  Wear comfortable shoes and clothing. Let your health care provider know if you were unable to complete or follow the preparations for your test. PROCEDURE   Multiple patches (electrodes) will be put on your chest. If needed, small areas of your chest may be shaved to get better  contact with the electrodes. Once the electrodes are attached to your body, multiple wires will be attached to the electrodes, and your heart rate will be monitored.  An IV access will be started. A nuclear trace (isotope) is given. The isotope may be given intravenously, or it may be swallowed. Nuclear refers to several types of radioactive isotopes, and the nuclear isotope lights up the arteries so that the nuclear images are clear. The isotope is absorbed by your body. This results in low radiation exposure.  A resting nuclear image is taken to show how your heart functions at rest.  A medicine is given through the IV access.  A second scan is done about 1 hour after the medicine injection and determines how your heart functions under stress.  During this stress phase, you will be connected to an electrocardiogram machine. Your blood pressure and oxygen levels will be monitored. AFTER THE PROCEDURE   Your heart rate and blood pressure will be monitored after the test.  You may return to your normal schedule, including diet,activities, and medicines, unless your health care provider tells you otherwise. This information is not intended to replace advice given to you by your health care provider. Make sure you discuss any questions you have with your health care provider. Document Released: 08/14/2008 Document Revised: 04/02/2013 Document Reviewed: 12/03/2012 Elsevier Interactive Patient Education  2017 [employer].     If you need a refill on your  cardiac medications before your next appointment, please call your pharmacy.      Signed, Lisa Form, PA-C  03/29/2016 4:29 PM    [facility] [place], [place], [place]  [address] Phone: [phone]; Fax: [phone]

## 2016-03-29 ENCOUNTER — Other Ambulatory Visit: Payer: Self-pay | Admitting: *Deleted

## 2016-03-29 ENCOUNTER — Encounter (INDEPENDENT_AMBULATORY_CARE_PROVIDER_SITE_OTHER): Payer: Self-pay

## 2016-03-29 ENCOUNTER — Ambulatory Visit (INDEPENDENT_AMBULATORY_CARE_PROVIDER_SITE_OTHER): Payer: Medicare Other | Admitting: Physician Assistant

## 2016-03-29 ENCOUNTER — Encounter: Payer: Self-pay | Admitting: Physician Assistant

## 2016-03-29 VITALS — BP 158/86 | HR 63 | Ht 63.0 in | Wt 217.8 lb

## 2016-03-29 DIAGNOSIS — R059 Cough, unspecified: Secondary | ICD-10-CM

## 2016-03-29 DIAGNOSIS — I5032 Chronic diastolic (congestive) heart failure: Secondary | ICD-10-CM | POA: Diagnosis not present

## 2016-03-29 DIAGNOSIS — R0602 Shortness of breath: Secondary | ICD-10-CM

## 2016-03-29 DIAGNOSIS — R05 Cough: Secondary | ICD-10-CM

## 2016-03-29 DIAGNOSIS — I10 Essential (primary) hypertension: Secondary | ICD-10-CM | POA: Diagnosis not present

## 2016-03-29 MED ORDER — AZITHROMYCIN 250 MG PO TABS: ORAL_TABLET | ORAL | 0 refills | Status: DC

## 2016-03-29 NOTE — Telephone Encounter (Signed)
HAS TO CALL IN AZITHROMYCIN FOR PT AS THE RX INSTRUCTIONS WERE TO LONG FOR PRINTING AND PT LEFT WITHOUT THE PRINTED RX, SPOKE WITH JIM, GAVE VERBAL RX FOR THIS.

## 2016-03-29 NOTE — Patient Instructions (Signed)
Medication Instructions:  Your physician has recommended you make the following change in your medication:  1.  START the ZPak as directed on the box   Labwork: None ordered  Testing/Procedures: Your physician has requested that you have a lexiscan myoview. For further information please visit [email or web address]. Please follow instruction sheet, as given.    Follow-Up: Your physician recommends that you schedule a follow-up appointment in: [place]   Any Other Special Instructions Will Be Listed Below (If Applicable). Pharmacologic Stress Electrocardiogram A pharmacologic stress electrocardiogram is a heart (cardiac) test that uses nuclear imaging to evaluate the blood supply to your heart. This test may also be called a pharmacologic stress electrocardiography. Pharmacologic means that a medicine is used to increase your heart rate and blood pressure.  This stress test is done to find areas of poor blood flow to the heart by determining the extent of coronary artery disease (CAD). Some people exercise on a treadmill, which naturally increases the blood flow to the heart. For those people unable to exercise on a treadmill, a medicine is used. This medicine stimulates your heart and will cause your heart to beat harder and more quickly, as if you were exercising.  Pharmacologic stress tests can help determine:  The adequacy of blood flow to your heart during increased levels of activity in order to clear you for discharge home.  The extent of coronary artery blockage caused by CAD.  Your prognosis if you have suffered a heart attack.  The effectiveness of cardiac procedures done, such as an angioplasty, which can increase the circulation in your coronary arteries.  Causes of chest pain or pressure. LET [facility] CARE PROVIDER KNOW ABOUT:  Any allergies you have.  All medicines you are taking, including vitamins, herbs, eye drops, creams, and over-the-counter  medicines.  Previous problems you or members of your family have had with the use of anesthetics.  Any blood disorders you have.  Previous surgeries you have had.  Medical conditions you have.  Possibility of pregnancy, if this applies.  If you are currently breastfeeding. RISKS AND COMPLICATIONS Generally, this is a safe procedure. However, as with any procedure, complications can occur. Possible complications include:  You develop pain or pressure in the following areas:  Chest.  Jaw or neck.  Between your shoulder blades.  Radiating down your left arm.  Headache.  Dizziness or light-headedness.  Shortness of breath.  Increased or irregular heartbeat.  Low blood pressure.  Nausea or vomiting.  Flushing.  Redness going up the arm and slight pain during injection of medicine.  Heart attack (rare). BEFORE THE PROCEDURE   Avoid all forms of caffeine for 24 hours before your test or as directed by your health care provider. This includes coffee, tea (even decaffeinated tea), caffeinated sodas, chocolate, cocoa, and certain pain medicines.  Follow your health care provider's instructions regarding eating and drinking before the test.  Take your medicines as directed at regular times with water unless instructed otherwise. Exceptions may include:  If you have diabetes, ask how you are to take your insulin or pills. It is common to adjust insulin dosing the morning of the test.  If you are taking beta-blocker medicines, it is important to talk to your health care provider about these medicines well before the date of your test. Taking beta-blocker medicines may interfere with the test. In some cases, these medicines need to be changed or stopped 24 hours or more before the test.  If  you wear a nitroglycerin patch, it may need to be removed prior to the test. Ask your health care provider if the patch should be removed before the test.  If you use an inhaler for any  breathing condition, bring it with you to the test.  If you are an outpatient, bring a snack so you can eat right after the stress phase of the test.  Do not smoke for 4 hours prior to the test or as directed by your health care provider.  Do not apply lotions, powders, creams, or oils on your chest prior to the test.  Wear comfortable shoes and clothing. Let your health care provider know if you were unable to complete or follow the preparations for your test. PROCEDURE   Multiple patches (electrodes) will be put on your chest. If needed, small areas of your chest may be shaved to get better contact with the electrodes. Once the electrodes are attached to your body, multiple wires will be attached to the electrodes, and your heart rate will be monitored.  An IV access will be started. A nuclear trace (isotope) is given. The isotope may be given intravenously, or it may be swallowed. Nuclear refers to several types of radioactive isotopes, and the nuclear isotope lights up the arteries so that the nuclear images are clear. The isotope is absorbed by your body. This results in low radiation exposure.  A resting nuclear image is taken to show how your heart functions at rest.  A medicine is given through the IV access.  A second scan is done about 1 hour after the medicine injection and determines how your heart functions under stress.  During this stress phase, you will be connected to an electrocardiogram machine. Your blood pressure and oxygen levels will be monitored. AFTER THE PROCEDURE   Your heart rate and blood pressure will be monitored after the test.  You may return to your normal schedule, including diet,activities, and medicines, unless your health care provider tells you otherwise. This information is not intended to replace advice given to you by your health care provider. Make sure you discuss any questions you have with your health care provider. Document Released:  08/14/2008 Document Revised: 04/02/2013 Document Reviewed: 12/03/2012 Elsevier Interactive Patient Education  2017 [employer].     If you need a refill on your cardiac medications before your next appointment, please call your pharmacy.

## 2016-04-07 DIAGNOSIS — G4733 Obstructive sleep apnea (adult) (pediatric): Secondary | ICD-10-CM | POA: Diagnosis not present

## 2016-04-07 DIAGNOSIS — R05 Cough: Secondary | ICD-10-CM | POA: Diagnosis not present

## 2016-04-07 DIAGNOSIS — R0600 Dyspnea, unspecified: Secondary | ICD-10-CM | POA: Diagnosis not present

## 2016-04-07 DIAGNOSIS — I5032 Chronic diastolic (congestive) heart failure: Secondary | ICD-10-CM | POA: Diagnosis not present

## 2016-04-07 DIAGNOSIS — Z6838 Body mass index (BMI) 38.0-38.9, adult: Secondary | ICD-10-CM | POA: Diagnosis not present

## 2016-04-07 DIAGNOSIS — I10 Essential (primary) hypertension: Secondary | ICD-10-CM | POA: Diagnosis not present

## 2016-04-12 ENCOUNTER — Telehealth (HOSPITAL_COMMUNITY): Payer: Self-pay | Admitting: *Deleted

## 2016-04-12 NOTE — Telephone Encounter (Signed)
Patient given detailed instructions per Myocardial Perfusion Study Information Sheet for the test on 04/19/16 at 1000. Patient notified to arrive 15 minutes early and that it is imperative to arrive on time for appointment to keep from having the test rescheduled.  If you need to cancel or reschedule your appointment, please call the office within 24 hours of your appointment. Failure to do so may result in a cancellation of your appointment, and a $50 no show fee. Patient verbalized understanding.Melisse Caetano, Ranae Palms

## 2016-04-14 ENCOUNTER — Encounter (HOSPITAL_COMMUNITY): Payer: Medicare Other

## 2016-04-19 ENCOUNTER — Encounter (HOSPITAL_COMMUNITY): Payer: Medicare Other

## 2016-04-20 ENCOUNTER — Telehealth (HOSPITAL_COMMUNITY): Payer: Self-pay | Admitting: Cardiology

## 2016-04-20 NOTE — Telephone Encounter (Signed)
I called pt and spoke with her about rescheduling her appt and she voiced that she was out to with friends and would call back once she returned home so she would be near her calendar.

## 2016-04-21 NOTE — Telephone Encounter (Signed)
I called pt and spoke with to try to reschedule the [facility] and she voiced that she has cataract surgery next week and will call back after that to get the stress test scheduled.

## 2016-05-05 DIAGNOSIS — H25811 Combined forms of age-related cataract, right eye: Secondary | ICD-10-CM | POA: Diagnosis not present

## 2016-05-05 DIAGNOSIS — H2511 Age-related nuclear cataract, right eye: Secondary | ICD-10-CM | POA: Diagnosis not present

## 2016-05-20 ENCOUNTER — Ambulatory Visit: Payer: Medicare Other | Admitting: Cardiology

## 2016-05-27 ENCOUNTER — Telehealth (HOSPITAL_COMMUNITY): Payer: Self-pay | Admitting: Cardiology

## 2016-05-27 NOTE — Telephone Encounter (Signed)
Called patient to see if wanted to r/s her myoview appts that were cancelled on 04/14/16 and 04/19/16. She voiced that she is doing and feeling much better and does not feel that she needs the test at this time.

## 2016-05-31 NOTE — Telephone Encounter (Signed)
Ok with me 

## 2016-05-31 NOTE — Telephone Encounter (Signed)
Will forward this information to Dr  Meda Coffee to make her aware that pt feels as if she doesn't need follow-up or stress test that was scheduled at this time.  Pt cancelled both appts.  Will have Dr Meda Coffee advise on follow-up like 6 months to a year, so this can be placed as a recall in the pts chart and so she will be followed.

## 2016-06-01 IMAGING — NM NM GI BLOOD LOSS
2 series · 12 of 12 positions shown · non-contrast
Comparison: None.

CLINICAL DATA: Hematochezia. Inpatient. Red blood in stool 1 week
prior.

EXAM:
NUCLEAR MEDICINE GASTROINTESTINAL BLEEDING SCAN
TECHNIQUE: Sequential abdominal images were obtained following intravenous
administration of 6c-TTm labeled red blood cells.
RADIOPHARMACEUTICALS:  22.9 mCi 6c-TTm in-vitro labeled red cells.

[gi gi bleed · 5.01mm/px · 6 of 60 frames shown (1 of 2)]
[frame 6/60]
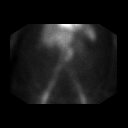
[frame 16/60]
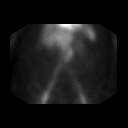
[frame 26/60]
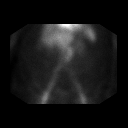
[frame 36/60]
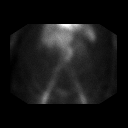
[frame 46/60]
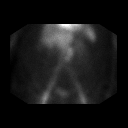
[frame 56/60]
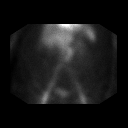

[gi gi bleed · 5.01mm/px · 6 of 60 frames shown (2 of 2)]
[frame 6/60]
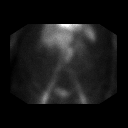
[frame 16/60]
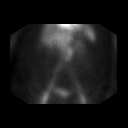
[frame 26/60]
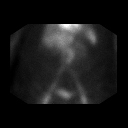
[frame 36/60]
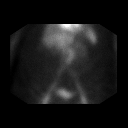
[frame 46/60]
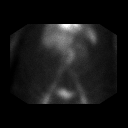
[frame 56/60]
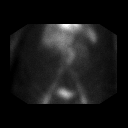

[12 of 12 positions shown; findings below may reference images not displayed]

FINDINGS: There is no abnormal tagged red blood cell activity within the
abdomen or pelvis to suggest active GI bleeding.
IMPRESSION: No scintigraphic evidence of active GI bleeding.

## 2016-06-08 IMAGING — CR DG CHEST 2V
2 series · 2 of 2 positions shown · non-contrast
Comparison: CT 03/09/2015

CLINICAL DATA: Cough and fever.  Recent fall.

EXAM:
CHEST  2 VIEW

[chest lat]
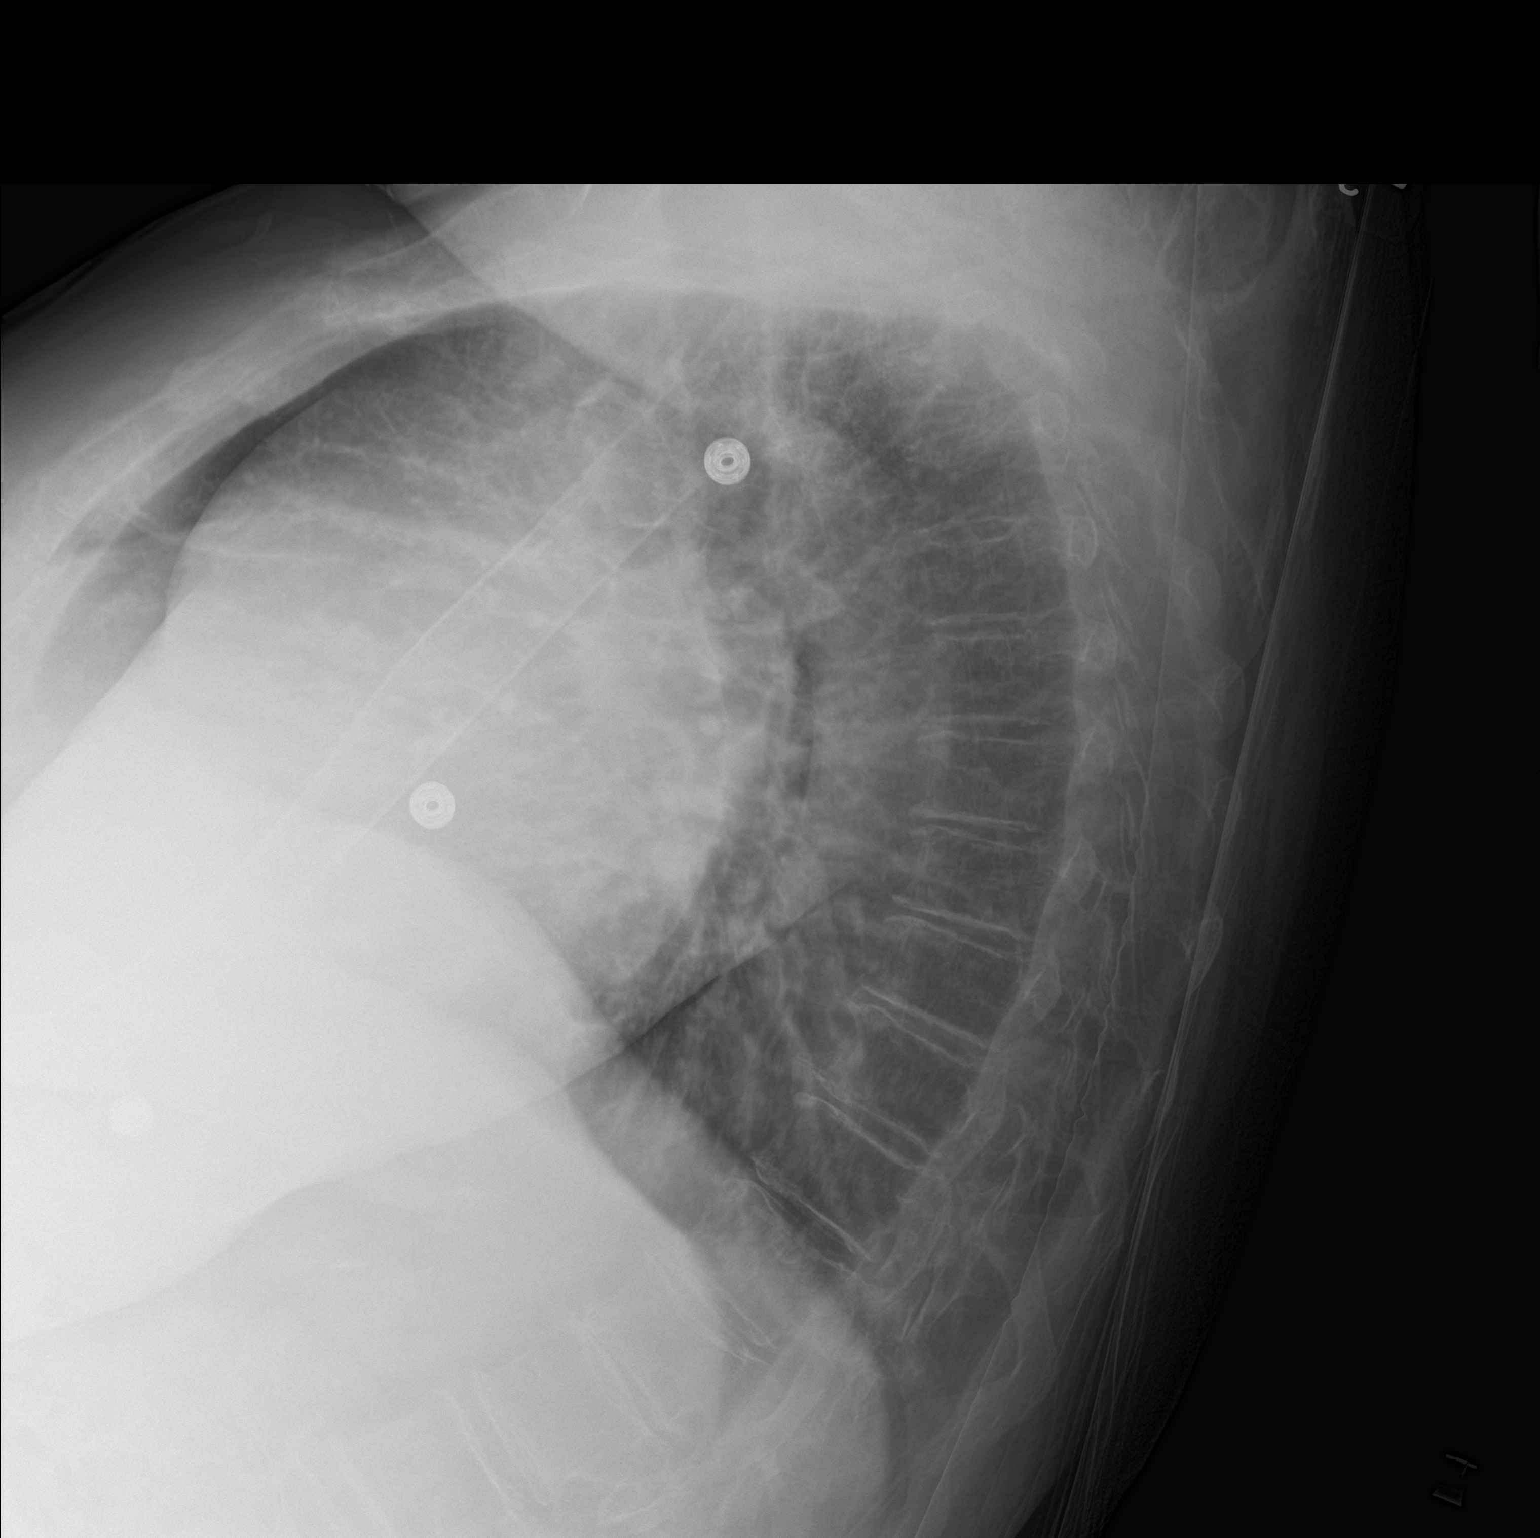

[chest ap]
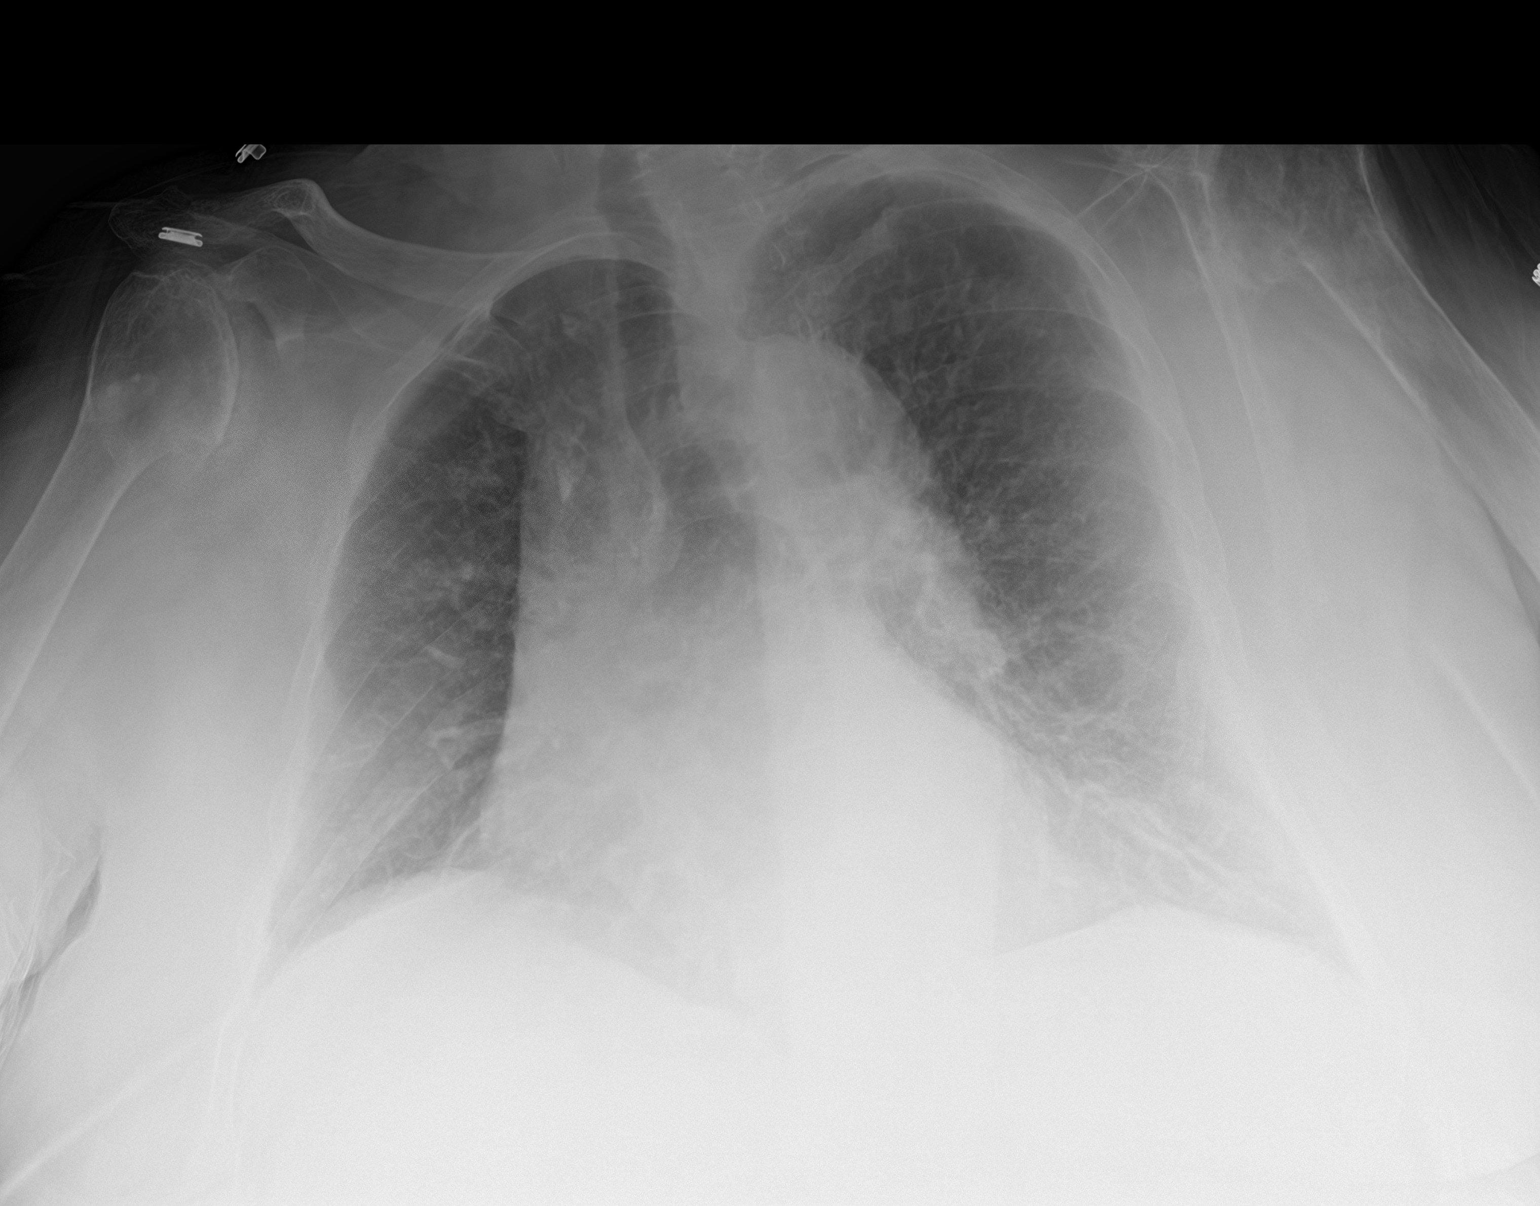

[2 of 2 positions shown; findings below may reference images not displayed]

FINDINGS: The heart is enlarged. There is tortuosity of the thoracic aorta.
Mild bibasilar atelectasis or scarring, unchanged. No confluent
airspace disease, pleural effusion or pneumothorax. Severe
degenerative change in both shoulders.
IMPRESSION: Stable cardiomegaly.  Unchanged bibasilar atelectasis or scarring.

## 2016-06-23 DIAGNOSIS — Z6834 Body mass index (BMI) 34.0-34.9, adult: Secondary | ICD-10-CM | POA: Diagnosis not present

## 2016-06-23 DIAGNOSIS — M25512 Pain in left shoulder: Secondary | ICD-10-CM | POA: Diagnosis not present

## 2016-06-23 DIAGNOSIS — E668 Other obesity: Secondary | ICD-10-CM | POA: Diagnosis not present

## 2016-06-23 DIAGNOSIS — I10 Essential (primary) hypertension: Secondary | ICD-10-CM | POA: Diagnosis not present

## 2016-06-23 DIAGNOSIS — G4733 Obstructive sleep apnea (adult) (pediatric): Secondary | ICD-10-CM | POA: Diagnosis not present

## 2016-07-05 ENCOUNTER — Ambulatory Visit: Payer: Medicare Other | Attending: Internal Medicine | Admitting: Occupational Therapy

## 2016-07-05 DIAGNOSIS — M25511 Pain in right shoulder: Secondary | ICD-10-CM | POA: Diagnosis not present

## 2016-07-05 DIAGNOSIS — M6281 Muscle weakness (generalized): Secondary | ICD-10-CM | POA: Insufficient documentation

## 2016-07-05 DIAGNOSIS — M25611 Stiffness of right shoulder, not elsewhere classified: Secondary | ICD-10-CM | POA: Insufficient documentation

## 2016-07-05 DIAGNOSIS — R278 Other lack of coordination: Secondary | ICD-10-CM | POA: Insufficient documentation

## 2016-07-05 DIAGNOSIS — R2681 Unsteadiness on feet: Secondary | ICD-10-CM | POA: Diagnosis not present

## 2016-07-05 DIAGNOSIS — M25612 Stiffness of left shoulder, not elsewhere classified: Secondary | ICD-10-CM | POA: Diagnosis not present

## 2016-07-05 DIAGNOSIS — G8929 Other chronic pain: Secondary | ICD-10-CM

## 2016-07-06 NOTE — Therapy (Signed)
Lisa Mcclure [address] [place], [place], [address] Phone: [phone]   Fax:  [phone]  Occupational Therapy Treatment  Patient Details  Name: Lisa Mcclure MRN: [id number] Date of Birth: 04-Mar-1933 Referring Provider: Dr. Sharlett Iles  Encounter Date: 07/05/2016      OT End of Session - 07/06/16 0845    Visit Number 1   Number of Visits 17   Date for OT Re-Evaluation 09/02/16   Authorization Type Medicare, champ [employer]   Authorization - Visit Number 1   Authorization - Number of Visits 10   OT Start Time 4481   OT Stop Time 1615   OT Time Calculation (min) 42 min   Activity Tolerance Patient tolerated treatment well   Behavior During Therapy [facility] for tasks assessed/performed      Past Medical History:  Diagnosis Date  . Anemia    h/o hemorrhoidal bleeding and blood transfusion  . Cardiomegaly   . Chronic diastolic CHF (congestive heart failure) ([place])   . Depression   . Diverticulosis   . DOE (dyspnea on exertion)   . Esophageal reflux   . GERD (gastroesophageal reflux disease)   . Hypothyroidism   . Insomnia   . LBP (low back pain)   . OAB (overactive bladder)   . Obesity   . OSA (obstructive sleep apnea)   . Osteoarthritis   . Rheumatoid arthritis(714.0)   . Shoulder pain, bilateral   . Unspecified essential hypertension   . Venous insufficiency     Past Surgical History:  Procedure Laterality Date  . APPENDECTOMY  1953  . bladder abduction-1996  1996  . breast biopsy Right 1980  . [place]  . COSMETIC SURGERY  1996  . CYSTOCELE REPAIR    . FLEXIBLE SIGMOIDOSCOPY N/A 04/10/2015   Procedure: FLEXIBLE SIGMOIDOSCOPY;  Surgeon: Arta Silence, MD;  Location: [facility] ENDOSCOPY;  Service: Endoscopy;  Laterality: N/A;  . knee arthroscopy Right 1996, 2010  . KNEE ARTHROSCOPY W/ AUTOGENOUS CARTILAGE IMPLANTATION (ACI) PROCEDURE Left 1994, 1995  . SHOULDER SURGERY  1990  .  TOTAL ABDOMINAL HYSTERECTOMY  1972  . VESICOVAGINAL FISTULA CLOSURE W/ TAH    . WRIST SURGERY  1967    There were no vitals filed for this visit.      Subjective Assessment - 07/06/16 0813    Subjective  Pt with OA and limitations in bilateral shoulder A/ROM due to hx of multiple falls and left humerus fx 11/16, and hx of right humerus fx 7/15.   Pertinent History OA, RA, hx of left(11/16) and right humeral fx(7/15), CHF, HTN, hx of breast biopsy in 1980   Limitations hx of breast biopsy in 1980, discuss further before modalities   Patient Stated Goals improve indpendence with ADLS, use of UE's   Currently in Pain? Yes   Pain Score 5   5-10/10   Pain Location Shoulder   Pain Orientation Right   Pain Descriptors / Indicators Aching   Pain Type Chronic pain   Pain Onset More than a month ago   Pain Frequency Intermittent   Aggravating Factors  movement   Pain Relieving Factors repositioning   Effect of Pain on Daily Activities limits daily activities, Pt denies left shoulder pain            [facility] OT Assessment - 07/06/16 0001      Assessment   Diagnosis bilateral shoulder OA, with hx of left and right humberal fx  Referring Provider Dr. Sharlett Iles   Onset Date 06/27/16   Prior Therapy PT     Precautions   Precautions Fall     Balance Screen   Has the patient fallen in the past 6 months No   Has the patient had a decrease in activity level because of a fear of falling?  No   Is the patient reluctant to leave their home because of a fear of falling?  No     Home  Environment   Family/patient expects to be discharged to: Private residence  [place] One level   Lives With [place] Retired   Biomedical scientist previous real estate   Leisure reading, knitting     ADL   Eating/Feeding Independent   Grooming Modified independent  difficulty styling hair    Upper Body Bathing Modified independent   Lower Body Bathing Modified independent  has grab bars   Upper Body Dressing --  modified independent, with difficulty   Lower Body Dressing Modified independent   [place] independent   Tub/Shower Transfer Independent   ADL comments Pt reports difficulty donning/ doffing coat, styling hair, pulling shirt overhead     IADL   Shopping Takes care of all shopping needs independently   Light Housekeeping Performs light daily tasks such as dishwashing, bed making, has assistance with heavier tasks   Meal Prep Able to complete simple warm meal prep   Medication Management Is responsible for taking medication in correct dosages at correct time   Financial Management Manages financial matters independently (budgets, writes checks, pays rent, bills goes to bank), collects and keeps track of income     Mobility   Mobility Status Independent;History of falls     Written Expression   Dominant Hand Right     Cognition   Overall Cognitive Status Within Functional Limits for tasks assessed     Observation/Other Assessments   Outcome Measures 3 button/ unbutton: 48.50 secs     Sensation   Light Touch Impaired by gross assessment  Pt reports if she sleeps on wrong side, she has numbness     Coordination   9 Hole Peg Test Right;Left   Right 9 Hole Peg Test 27.12   Left 9 Hole Peg Test 32.03     ROM / Strength   AROM / PROM / Strength AROM;PROM     AROM   Overall AROM  Deficits;Due to pain   AROM Assessment Site Shoulder   Right/Left Shoulder Right;Left   Right Shoulder Flexion 75 Degrees   Right Shoulder ABduction 55 Degrees   Left Shoulder Flexion 80 Degrees   Left Shoulder ABduction 70 Degrees     Strength   Overall Strength Deficits   Overall Strength Comments distal strength 3-3+/5, resistance was not applied to test shoulder strength                            OT Short Term Goals - 07/06/16 0836       OT SHORT TERM GOAL #1   Title I with HEP-08/04/16   Time 4   Period Weeks   Status New     OT SHORT TERM GOAL #2   Title Pt will verbalize understanding of adapted strategies for ADLS (including donning a shirt, coat and styling hair)   Time  4   Period Weeks   Status New                            OT Long Term Goals - 07-07-16 0837      OT LONG TERM GOAL #1   Title Pt will report right shoulder pain is consistently 5/10 or less for ADLS.-09/02/16   Time 8   Period Weeks   Status New     OT LONG TERM GOAL #2   Title Pt will demonstrate improved ease with fastening buttons as evidenced by decreasing 3 buton/ unbutton score to 42 secs.   Baseline 48.50 secs.   Time 8   Period Weeks   Status New     OT LONG TERM GOAL #3   Title Pt will increase right shoulder flexion  to 85* for increased functional use for ADLs.   Time 8   Period Weeks   Status New     OT LONG TERM GOAL #4   Title Pt will increase left shoulder flexion to 90* for increased functional use for ADLs.   Time 8   Period Weeks   Status New               Plan - 2016-07-07 0831    Clinical Impression Statement Pt with PMH significant for OA, RA, HTN, CHF, L patella fx, wrist fx, bilateral TKR with history of right humerus fx 7/15, and left humerus fx 11/16(pt also reports past history of frozen shoulders) presents with decreased strength, decreased coordination, decreased endurance, and decreased balance which impedes performance of ADLS/IADLs. Pt can benefit from skilled occupational therapy to maximize pt  safety and independence with ADLS and to maintain quality of life.   Rehab Potential Good   OT Frequency 2x / week  plus eval   OT Duration 8 weeks   OT Treatment/Interventions Self-care/ADL training;Moist Heat;Fluidtherapy;DME and/or AE instruction;Patient/family education;Balance training;Therapeutic exercises;Ultrasound;Therapeutic exercise;Therapeutic activities;Passive range of  motion;Neuromuscular education;Cryotherapy;Electrical Stimulation;Parrafin;Energy conservation;Manual Therapy   Plan initate HEP in supine for shoulder ROM, adapted strategies for ADLs.   Consulted and Agree with Plan of Care Patient      Patient will benefit from skilled therapeutic intervention in order to improve the following deficits and impairments:  Abnormal gait, Decreased knowledge of use of DME, Impaired flexibility, Impaired sensation, Decreased mobility, Decreased coordination, Decreased activity tolerance, Decreased endurance, Decreased range of motion, Decreased strength, Impaired UE functional use, Impaired perceived functional ability, Difficulty walking, Decreased safety awareness, Decreased knowledge of precautions, Decreased balance  Visit Diagnosis: Other lack of coordination  Stiffness of left shoulder, not elsewhere classified - Plan: Ot plan of care cert/re-cert  Stiffness of right shoulder, not elsewhere classified  Muscle weakness (generalized)  Unsteadiness on feet  Chronic right shoulder pain      G-Codes - 07-07-16 0953    Functional Assessment Tool Used (Outpatient only) clinical impressions R shoulder flexion 75*, L shoulder flexion 80*, 3 button unbutton 48.50 secs   Functional Limitation Self care   Self Care Current Status (I4580) At least 20 percent but less than 40 percent impaired, limited or restricted   Self Care Goal Status (D9833) At least 1 percent but less than 20 percent impaired, limited or restricted      Problem List Patient Active Problem List   Diagnosis Date Noted  . Aortic stenosis, mild 01/15/2016  . Bilateral carotid bruits 05/11/2015  . Hyponatremia 04/19/2015  . Chronic anemia 04/19/2015  .  Panic attack 04/19/2015  . Constipation 04/19/2015  . Hyperkalemia 04/17/2015  . Influenza A 04/16/2015  . Proximal humerus fracture   . Left patella fracture   . Benign essential HTN   . Esophageal reflux   . Chronic diastolic  heart failure ([place])   . Thyroid activity decreased   . Lower GI bleed 04/08/2015  . Hematochezia   . Rectal bleeding 04/03/2015  . Patellar fracture 03/06/2015  . Left humeral fracture 11/04/2013  . Rib fracture 11/04/2013  . Hypothyroid 11/04/2013  . Hypertension 11/04/2013  . OSA (obstructive sleep apnea) 11/13/2008  . BOOP (bronchiolitis obliterans with organizing pneumonia) ([place]) 08/25/2008    Loxley Schmale 07/06/2016, 10:04 AM Theone Murdoch, OTR/L [phone] Phone: [phone] 10:04 AM 07/06/16 [place] [address] [place], [place], [address] Phone: [phone]   Fax:  [phone]  Name: Lisa Mcclure MRN: [id number] Date of Birth: January 06, 1933

## 2016-07-07 ENCOUNTER — Telehealth: Payer: Self-pay | Admitting: Occupational Therapy

## 2016-07-07 NOTE — Telephone Encounter (Signed)
Dr. Philip Aspen,  Lisa Mcclure was seen for an occupational therapy eval to address her ADLS due to OA/shoulder pain. She demonstrates balance deficits and has a history of falls. She can benefit from  a physical therapy  evaluation to address balance to maximize pt safety. If you agree please fax an order to our site. Sincerely, Theone Murdoch, OTR/L [phone] Phone: [phone] 1:11 PM 07/07/16

## 2016-07-12 ENCOUNTER — Ambulatory Visit: Payer: Medicare Other | Attending: Internal Medicine | Admitting: Occupational Therapy

## 2016-07-12 DIAGNOSIS — R278 Other lack of coordination: Secondary | ICD-10-CM | POA: Insufficient documentation

## 2016-07-12 DIAGNOSIS — M6281 Muscle weakness (generalized): Secondary | ICD-10-CM | POA: Diagnosis not present

## 2016-07-12 DIAGNOSIS — M25612 Stiffness of left shoulder, not elsewhere classified: Secondary | ICD-10-CM | POA: Diagnosis not present

## 2016-07-12 DIAGNOSIS — M25611 Stiffness of right shoulder, not elsewhere classified: Secondary | ICD-10-CM | POA: Insufficient documentation

## 2016-07-12 NOTE — Patient Instructions (Signed)
Dr. Onnie Graham,  Lisa Mcclure is receiving occupational therapy to address UE functional use and ADLs.  Please advise regarding any new diagnoses or precautions for therapy.  Sincerely, Theone Murdoch, OTR/L

## 2016-07-13 DIAGNOSIS — M25511 Pain in right shoulder: Secondary | ICD-10-CM | POA: Diagnosis not present

## 2016-07-13 DIAGNOSIS — M25512 Pain in left shoulder: Secondary | ICD-10-CM | POA: Diagnosis not present

## 2016-07-13 DIAGNOSIS — G8929 Other chronic pain: Secondary | ICD-10-CM | POA: Diagnosis not present

## 2016-07-13 DIAGNOSIS — S82025D Nondisplaced longitudinal fracture of left patella, subsequent encounter for closed fracture with routine healing: Secondary | ICD-10-CM | POA: Diagnosis not present

## 2016-07-13 DIAGNOSIS — M19011 Primary osteoarthritis, right shoulder: Secondary | ICD-10-CM | POA: Diagnosis not present

## 2016-07-13 NOTE — Therapy (Signed)
[facility] [address] [place], [place], [address] Phone: [phone]   Fax:  [phone]  Occupational Therapy Treatment  Patient Details  Name: Lisa Mcclure MRN: [id number] Date of Birth: 12/05/1932 Referring Provider: Dr. Sharlett Iles  Encounter Date: 07/12/2016      OT End of Session - 07/13/16 1226    Visit Number 2   Number of Visits 17   Date for OT Re-Evaluation 09/02/16   Authorization - Visit Number 2   Authorization - Number of Visits 10   OT Start Time 1535  2 units, hotapack   OT Stop Time 1620   OT Time Calculation (min) 45 min      Past Medical History:  Diagnosis Date  . Anemia    h/o hemorrhoidal bleeding and blood transfusion  . Cardiomegaly   . Chronic diastolic CHF (congestive heart failure) ([place])   . Depression   . Diverticulosis   . DOE (dyspnea on exertion)   . Esophageal reflux   . GERD (gastroesophageal reflux disease)   . Hypothyroidism   . Insomnia   . LBP (low back pain)   . OAB (overactive bladder)   . Obesity   . OSA (obstructive sleep apnea)   . Osteoarthritis   . Rheumatoid arthritis(714.0)   . Shoulder pain, bilateral   . Unspecified essential hypertension   . Venous insufficiency     Past Surgical History:  Procedure Laterality Date  . APPENDECTOMY  1953  . bladder abduction-1996  1996  . breast biopsy Right 1980  . [place]  . COSMETIC SURGERY  1996  . CYSTOCELE REPAIR    . FLEXIBLE SIGMOIDOSCOPY N/A 04/10/2015   Procedure: FLEXIBLE SIGMOIDOSCOPY;  Surgeon: Arta Silence, MD;  Location: [facility] ENDOSCOPY;  Service: Endoscopy;  Laterality: N/A;  . knee arthroscopy Right 1996, 2010  . KNEE ARTHROSCOPY W/ AUTOGENOUS CARTILAGE IMPLANTATION (ACI) PROCEDURE Left 1994, 1995  . SHOULDER SURGERY  1990  . TOTAL ABDOMINAL HYSTERECTOMY  1972  . VESICOVAGINAL FISTULA CLOSURE W/ TAH    . WRIST SURGERY  1967    There were no vitals filed  for this visit.      Subjective Assessment - 07/13/16 1228    Subjective  Pt reports she is seeing orth MD tomorrow   Pertinent History OA, RA, hx of left(11/16) and right humeral fx(7/15), CHF, HTN, hx of breast biopsy in 1980, pt reports biospy was benign   Patient Stated Goals improve indpendence with ADLS, use of UE's   Currently in Pain? Yes   Pain Score 5    Pain Location Shoulder   Pain Orientation Right   Pain Descriptors / Indicators Aching   Pain Type Chronic pain   Pain Onset More than a month ago   Pain Frequency Intermittent   Aggravating Factors  movement   Pain Relieving Factors repositioning   Effect of Pain on Daily Activities limits daily activities          Hotpack to bilateral shoulders x 10 mins in supine due to shoulder pain, no adverse reactions. Supine closed chain chest press and shoulder flexion with noodle/ ball, gentle internal and external rotation, gentle AA/ROM RUE shoulder flexion and abduction as tolerated, Seated with arms in external rotation pt performed scapular retraction                      OT Short Term Goals - 07/06/16 9702  OT SHORT TERM GOAL #1   Title I with HEP-08/04/16   Time 4   Period Weeks   Status New     OT SHORT TERM GOAL #2   Title Pt will verbalize understanding of adapted strategies for ADLS (including donning a shirt, coat and styling hair)   Time 4   Period Weeks   Status New     OT SHORT TERM GOAL #3   Title --   Time 4   Status New           OT Long Term Goals - 07/06/16 6606      OT LONG TERM GOAL #1   Title Pt will report right shoulder pain is consistently 5/10 or less for ADLS.-09/02/16   Time 8   Period Weeks   Status New     OT LONG TERM GOAL #2   Title Pt will demonstrate improved ease with fastening buttons as evidenced by decreasing 3 buton/ unbutton score to 42 secs.   Baseline 48.50 secs.   Time 8   Period Weeks   Status New     OT LONG TERM GOAL #3   Title  Pt will increase right shoulder flexion  to 85* for increased functional use for ADLs.   Time 8   Period Weeks   Status New     OT LONG TERM GOAL #4   Title Pt will increase left shoulder flexion to 90* for increased functional use for ADLs.   Time 8   Period Weeks   Status New               Plan - 07/13/16 1225    Clinical Impression Statement Pt is progressing slowly towards goals limited by shoulder pain, and decreased ROM. Note sent with pt to ortho MD to clarify precuations.   Rehab Potential Good   OT Frequency 2x / week   OT Duration 8 weeks   Plan issue HEP, consider supine ball, scapular retraction in seated   Consulted and Agree with Plan of Care Patient      Patient will benefit from skilled therapeutic intervention in order to improve the following deficits and impairments:  Abnormal gait, Decreased knowledge of use of DME, Impaired flexibility, Impaired sensation, Decreased mobility, Decreased coordination, Decreased activity tolerance, Decreased endurance, Decreased range of motion, Decreased strength, Impaired UE functional use, Impaired perceived functional ability, Difficulty walking, Decreased safety awareness, Decreased knowledge of precautions, Decreased balance  Visit Diagnosis: Other lack of coordination  Stiffness of left shoulder, not elsewhere classified  Stiffness of right shoulder, not elsewhere classified  Muscle weakness (generalized)    Problem List Patient Active Problem List   Diagnosis Date Noted  . Aortic stenosis, mild 01/15/2016  . Bilateral carotid bruits 05/11/2015  . Hyponatremia 04/19/2015  . Chronic anemia 04/19/2015  . Panic attack 04/19/2015  . Constipation 04/19/2015  . Hyperkalemia 04/17/2015  . Influenza A 04/16/2015  . Proximal humerus fracture   . Left patella fracture   . Benign essential HTN   . Esophageal reflux   . Chronic diastolic heart failure ([place])   . Thyroid activity decreased   . Lower GI bleed  04/08/2015  . Hematochezia   . Rectal bleeding 04/03/2015  . Patellar fracture 03/06/2015  . Left humeral fracture 11/04/2013  . Rib fracture 11/04/2013  . Hypothyroid 11/04/2013  . Hypertension 11/04/2013  . OSA (obstructive sleep apnea) 11/13/2008  . BOOP (bronchiolitis obliterans with organizing pneumonia) ([place]) 08/25/2008    RINE,KATHRYN 07/13/2016,  12:29 PM  [facility] [address] [place], [place], [address] Phone: [phone]   Fax:  [phone]  Name: Le Ferraz MRN: [id number] Date of Birth: 1932-08-20

## 2016-07-15 ENCOUNTER — Ambulatory Visit: Payer: Medicare Other | Admitting: Occupational Therapy

## 2016-07-15 DIAGNOSIS — M25611 Stiffness of right shoulder, not elsewhere classified: Secondary | ICD-10-CM | POA: Diagnosis not present

## 2016-07-15 DIAGNOSIS — M25612 Stiffness of left shoulder, not elsewhere classified: Secondary | ICD-10-CM

## 2016-07-15 DIAGNOSIS — R278 Other lack of coordination: Secondary | ICD-10-CM | POA: Diagnosis not present

## 2016-07-15 DIAGNOSIS — M6281 Muscle weakness (generalized): Secondary | ICD-10-CM

## 2016-07-15 NOTE — Therapy (Signed)
[facility] [address] [place], [place], [address] Phone: [phone]   Fax:  [phone]  Occupational Therapy Treatment  Patient Details  Name: Lisa Mcclure MRN: [id number] Date of Birth: 1932/09/02 Referring Provider: Dr. Sharlett Iles  Encounter Date: 07/15/2016      OT End of Session - 07/15/16 1628    Visit Number 3   Number of Visits 17   Date for OT Re-Evaluation 09/02/16   Authorization Type Medicare, champ [employer]   Authorization - Visit Number 3   Authorization - Number of Visits 10   OT Start Time 8099  pt late   OT Stop Time 8338  2505   OT Time Calculation (min) 34 min   Activity Tolerance Patient tolerated treatment well   Behavior During Therapy [facility] for tasks assessed/performed      Past Medical History:  Diagnosis Date  . Anemia    h/o hemorrhoidal bleeding and blood transfusion  . Cardiomegaly   . Chronic diastolic CHF (congestive heart failure) ([place])   . Depression   . Diverticulosis   . DOE (dyspnea on exertion)   . Esophageal reflux   . GERD (gastroesophageal reflux disease)   . Hypothyroidism   . Insomnia   . LBP (low back pain)   . OAB (overactive bladder)   . Obesity   . OSA (obstructive sleep apnea)   . Osteoarthritis   . Rheumatoid arthritis(714.0)   . Shoulder pain, bilateral   . Unspecified essential hypertension   . Venous insufficiency     Past Surgical History:  Procedure Laterality Date  . APPENDECTOMY  1953  . bladder abduction-1996  1996  . breast biopsy Right 1980  . [place]  . COSMETIC SURGERY  1996  . CYSTOCELE REPAIR    . FLEXIBLE SIGMOIDOSCOPY N/A 04/10/2015   Procedure: FLEXIBLE SIGMOIDOSCOPY;  Surgeon: Arta Silence, MD;  Location: [facility] ENDOSCOPY;  Service: Endoscopy;  Laterality: N/A;  . knee arthroscopy Right 1996, 2010  . KNEE ARTHROSCOPY W/ AUTOGENOUS CARTILAGE IMPLANTATION (ACI) PROCEDURE Left 1994, 1995  . SHOULDER  SURGERY  1990  . TOTAL ABDOMINAL HYSTERECTOMY  1972  . VESICOVAGINAL FISTULA CLOSURE W/ TAH    . WRIST SURGERY  1967    There were no vitals filed for this visit.      Subjective Assessment - 07/15/16 1619    Subjective  Pt reports seein orthopedic MD.    Pertinent History OA, RA, hx of left(11/16) and right humeral fx(7/15), CHF, HTN, hx of breast biopsy in 1980, pt reports biospy was benign   Limitations Therapsit spoke via telephone with Dr. Onnie Graham, he recommends pt does not perfom ROM to shoulders or any activities that place stress on the shoulders, He recommends pt performs ADLS with arms by her sides and mainly from elbow to fingers   Patient Stated Goals improve indpendence with ADLS, use of UE's   Currently in Pain? Yes   Pain Score 5    Pain Location Shoulder   Pain Orientation Right   Pain Descriptors / Indicators Aching   Pain Type Chronic pain   Pain Onset More than a month ago   Pain Frequency Intermittent   Aggravating Factors  movements   Pain Relieving Factors repositioning   Effect of Pain on Daily Activities limits daily activities   Multiple Pain Sites No  Pt. did not fully meet goals as many goals were set prior to information from orthropedic MD that pt should not perform ROM ex and she should avoid any positions that stress her shoulders. Pt was encouraged to use adapted positions and equipment.          OT Education - 07/15/16 1630    Education provided Yes   Education Details educations regarding adapted strategies for ADLS and positioning to minimize stress on shoulders(use of buttonhook, long handled comb, telescoping hairdryer holder), recommendation that pt uses her arms only in pain free ROM and avoids stressing shoulders,    Person(s) Educated Patient   Methods Explanation;Handout   Comprehension Verbalized understanding          OT Short Term Goals - 07/15/16 1335      OT SHORT TERM GOAL #1   Title I  with HEP-08/04/16   Time 4   Period Weeks   Status Deferred  ortho MD does not recommend ROM to shoulders as it will place increased stress on shoulders.     OT SHORT TERM GOAL #2   Title Pt will verbalize understanding of adapted strategies for ADLS (including donning a shirt, coat and styling hair)   Time 4   Period Weeks   Status Achieved     OT SHORT TERM GOAL #3   Time 4   Status New           OT Long Term Goals - 07/15/16 1335      OT LONG TERM GOAL #1   Title Pt will report right shoulder pain is consistently 5/10 or less for ADLS.-09/02/16   Time 8   Period Weeks   Status Not Met     OT LONG TERM GOAL #2   Title Pt will demonstrate improved ease with fastening buttons as evidenced by decreasing 3 buton/ unbutton score to 42 secs.   Baseline 48.50 secs.   Time 8   Period Weeks   Status Deferred  Pt was instructed in use of buttonhook for increased ease and joint protection     OT LONG TERM GOAL #3   Title Pt will increase right shoulder flexion  to 85* for increased functional use for ADLs.   Time 8   Period Weeks   Status Deferred     OT LONG TERM GOAL #4   Title Pt will increase left shoulder flexion to 90* for increased functional use for ADLs.   Time 8   Period Weeks   Status Deferred               Plan - 07/15/16 1625    Clinical Impression Statement Therapist received precautions from orthopedic MD. He does not recommend any ROM exercises to shoulders and recommends that pt uses arms only in painfree ROM. As a result, pt education was completed today and pt agrees with plans for d/c. Therapsit has called PCP office to request an order for PT to address balance.   Rehab Potential Good   OT Frequency 2x / week   OT Duration 8 weeks   OT Treatment/Interventions Self-care/ADL training;Moist Heat;Fluidtherapy;DME and/or AE instruction;Patient/family education;Balance training;Therapeutic exercises;Ultrasound;Therapeutic exercise;Therapeutic  activities;Passive range of motion;Neuromuscular education;Cryotherapy;Electrical Stimulation;Parrafin;Energy conservation;Manual Therapy   Plan discharge OT, pt agrees   Consulted and Agree with Plan of Care Patient      Patient will benefit from skilled therapeutic intervention in order to improve the following deficits and impairments:  Abnormal gait, Decreased knowledge of use of DME, Impaired  flexibility, Impaired sensation, Decreased mobility, Decreased coordination, Decreased activity tolerance, Decreased endurance, Decreased range of motion, Decreased strength, Impaired UE functional use, Impaired perceived functional ability, Difficulty walking, Decreased safety awareness, Decreased knowledge of precautions, Decreased balance  Visit Diagnosis: Other lack of coordination  Stiffness of left shoulder, not elsewhere classified  Stiffness of right shoulder, not elsewhere classified  Muscle weakness (generalized)      G-Codes - Jul 25, 2016 1640    Functional Assessment Tool Used (Outpatient only) clinical impressions   Functional Limitation Self care   Self Care Goal Status (C7893) At least 1 percent but less than 20 percent impaired, limited or restricted   Self Care Discharge Status [phone]) At least 20 percent but less than 40 percent impaired, limited or restricted      Problem List Patient Active Problem List   Diagnosis Date Noted  . Aortic stenosis, mild 01/15/2016  . Bilateral carotid bruits 05/11/2015  . Hyponatremia 04/19/2015  . Chronic anemia 04/19/2015  . Panic attack 04/19/2015  . Constipation 04/19/2015  . Hyperkalemia 04/17/2015  . Influenza A 04/16/2015  . Proximal humerus fracture   . Left patella fracture   . Benign essential HTN   . Esophageal reflux   . Chronic diastolic heart failure ([place])   . Thyroid activity decreased   . Lower GI bleed 04/08/2015  . Hematochezia   . Rectal bleeding 04/03/2015  . Patellar fracture 03/06/2015  . Left humeral  fracture 11/04/2013  . Rib fracture 11/04/2013  . Hypothyroid 11/04/2013  . Hypertension 11/04/2013  . OSA (obstructive sleep apnea) 11/13/2008  . BOOP (bronchiolitis obliterans with organizing pneumonia) ([place]) 08/25/2008   OCCUPATIONAL THERAPY DISCHARGE SUMMARY    Current functional level related to goals / functional outcomes: See above   Remaining deficits: Decreased pain, decreased ROM, decreased strength, decreased balance   Education / Equipment: Pt was educated regarding adapted strategies for ADLs and importance of avoiding any positions that stress her shoulders. Pt verbalized understanding. Pt agrees with discharge at this time and she verbalizes understanding of importance of activity modification and AE. Pt can benefit from a referral to PT due to hx of multiple falls, balance issues and difficulty with stair climbing. Plan: Patient agrees to discharge.  Patient goals were partially met.                                                                                                      ?????     Jorje Vanatta 07/25/2016, 4:41 PM Theone Murdoch, OTR/L [phone] Phone: [phone] 4:41 PM Jul 25, 2016 [facility] [address] [place], [place], [address] Phone: [phone]   Fax:  [phone]  Name: Jeanene Mena MRN: [id number] Date of Birth: [birth year]

## 2016-07-20 ENCOUNTER — Encounter: Payer: Medicare Other | Admitting: Occupational Therapy

## 2016-07-21 ENCOUNTER — Encounter: Payer: Medicare Other | Admitting: Occupational Therapy

## 2016-07-25 ENCOUNTER — Other Ambulatory Visit: Payer: Self-pay | Admitting: Cardiology

## 2016-08-03 NOTE — Telephone Encounter (Signed)
Will refer encounter back to scheduling for closure.

## 2016-08-08 ENCOUNTER — Telehealth: Payer: Self-pay | Admitting: Physician Assistant

## 2016-08-08 ENCOUNTER — Telehealth: Payer: Self-pay | Admitting: *Deleted

## 2016-08-08 NOTE — Telephone Encounter (Signed)
Placed a call to Rodena Piety, [facility], [facility], to fax lab results over for pt from 02/2016. Left a detailed message.

## 2016-08-08 NOTE — Telephone Encounter (Signed)
Follow Up    Lisa Mcclure with [facility]/Dr. Sharlett Iles returning call from [name] what it was regarding. Requesting call back.

## 2016-08-08 NOTE — Telephone Encounter (Signed)
Follow Up   Per Rodena Piety last CBC was 03/01/16.

## 2016-08-22 ENCOUNTER — Ambulatory Visit: Payer: Medicare Other | Admitting: Physical Therapy

## 2016-09-02 ENCOUNTER — Ambulatory Visit: Payer: Medicare Other | Attending: Internal Medicine | Admitting: Physical Therapy

## 2016-09-02 ENCOUNTER — Encounter: Payer: Self-pay | Admitting: Physical Therapy

## 2016-09-02 DIAGNOSIS — R262 Difficulty in walking, not elsewhere classified: Secondary | ICD-10-CM | POA: Insufficient documentation

## 2016-09-02 DIAGNOSIS — R2681 Unsteadiness on feet: Secondary | ICD-10-CM | POA: Diagnosis not present

## 2016-09-02 DIAGNOSIS — M6281 Muscle weakness (generalized): Secondary | ICD-10-CM | POA: Insufficient documentation

## 2016-09-02 NOTE — Therapy (Signed)
[facility] [address] [place], [place], [address] Phone: [phone]   Fax:  [phone]  Physical Therapy Evaluation  Patient Details  Name: Lisa Mcclure MRN: [id number] Date of Birth: 1932-05-11 Referring Provider: [name]  Encounter Date: 09/02/2016      PT End of Session - 09/02/16 1757    Visit Number 1   Number of Visits 17   Date for PT Re-Evaluation 10/28/16   Authorization Type Medicare; G-Code   PT Start Time 0272   PT Stop Time 1150   PT Time Calculation (min) 48 min   Activity Tolerance Patient tolerated treatment well   Behavior During Therapy [facility] for tasks assessed/performed      Past Medical History:  Diagnosis Date  . Anemia    h/o hemorrhoidal bleeding and blood transfusion  . Cardiomegaly   . Chronic diastolic CHF (congestive heart failure) ([place])   . Depression   . Diverticulosis   . DOE (dyspnea on exertion)   . Esophageal reflux   . GERD (gastroesophageal reflux disease)   . Hypothyroidism   . Insomnia   . LBP (low back pain)   . OAB (overactive bladder)   . Obesity   . OSA (obstructive sleep apnea)   . Osteoarthritis   . Rheumatoid arthritis(714.0)   . Shoulder pain, bilateral   . Unspecified essential hypertension   . Venous insufficiency     Past Surgical History:  Procedure Laterality Date  . APPENDECTOMY  1953  . bladder abduction-1996  1996  . breast biopsy Right 1980  . [place]  . COSMETIC SURGERY  1996  . CYSTOCELE REPAIR    . FLEXIBLE SIGMOIDOSCOPY N/A 04/10/2015   Procedure: FLEXIBLE SIGMOIDOSCOPY;  Surgeon: Arta Silence, MD;  Location: [facility] ENDOSCOPY;  Service: Endoscopy;  Laterality: N/A;  . knee arthroscopy Right 1996, 2010  . KNEE ARTHROSCOPY W/ AUTOGENOUS CARTILAGE IMPLANTATION (ACI) PROCEDURE Left 1994, 1995  . SHOULDER SURGERY  1990  . TOTAL ABDOMINAL HYSTERECTOMY  1972  . VESICOVAGINAL FISTULA CLOSURE W/ TAH     . WRIST SURGERY  1967    There were no vitals filed for this visit.       Subjective Assessment - 09/02/16 1106    Subjective 83 yo presents with h/o falling (first fall 4 yrs ago; has broken a wrist, rt shoulder, lt shoulder, lt knee). Recently saw Dr. Onnie Graham due to rt shoulder pain and he found shoulders to be fused/frozen and not a surgical candidate. Has a tremendous fear of falling. Feels very unsteady. Used to enjoy walking/hiking.    Patient Stated Goals Be able to walk up and down a few steps; be able to go downtown to the events/galleries (uneven pavement)   Currently in Pain? Yes   Pain Score 5    Pain Location Shoulder   Pain Orientation Left   Pain Descriptors / Indicators Aching   Pain Type Chronic pain   Pain Onset More than a month ago   Pain Frequency Intermittent   Effect of Pain on Daily Activities limits daily activities            [facility] PT Assessment - 09/02/16 1112      Assessment   Medical Diagnosis RA, falls   Referring Provider [name]   Onset Date/Surgical Date --  unsure, years ago   Hand Dominance Right   Prior Therapy recent OT for only 3 visits due to Dr Onnie Graham stopping therapy  Precautions   Precautions Fall  no shoudler ROM/exercises per Dr. Onnie Graham     Balance Screen   Has the patient fallen in the past 6 months No   Has the patient had a decrease in activity level because of a fear of falling?  Yes   Is the patient reluctant to leave their home because of a fear of falling?  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   Leisure reading, knitting, walking, hiking     Cognition   Overall Cognitive Status Within Functional Limits for tasks assessed     Observation/Other Assessments   Observations walks in with SPC, wide BOS   Focus on Therapeutic Outcomes (FOTO)  not done     Sensation   Light Touch Not tested     Coordination   Gross Motor Movements are Fluid and Coordinated Yes   Fine Motor Movements are Fluid and Coordinated Yes     Posture/Postural Control   Posture/Postural Control Postural limitations   Postural Limitations Rounded Shoulders;Forward head;Increased thoracic kyphosis     ROM / Strength   AROM / PROM / Strength AROM;Strength  bil LEs     AROM   Overall AROM  Within functional limits for tasks performed  bil LEs   AROM Assessment Site Hip;Knee;Ankle     Strength   Overall Strength Deficits   Overall Strength Comments --   Strength Assessment Site Hip;Knee;Ankle   Right/Left Hip Right;Left   Right Hip Flexion 4/5   Right Hip ABduction 3+/5   Left Hip Flexion 4/5   Left Hip ABduction 3+/5   Right/Left Knee Right;Left   Right Knee Flexion 3+/5   Right Knee Extension 4/5   Left Knee Flexion 3+/5   Left Knee Extension 4/5   Right/Left Ankle Right;Left   Right Ankle Dorsiflexion 4/5   Right Ankle Plantar Flexion 4/5   Left Ankle Dorsiflexion 5/5     Transfers   Transfers Sit to Stand;Stand to Sit   Sit to Stand 6: Modified independent (Device/Increase time);With upper extremity assist;With armrests   Stand to Sit 6: Modified independent (Device/Increase time);With armrests     Ambulation/Gait   Ambulation/Gait Yes   Ambulation Distance (Feet) 100 Feet   Assistive device Straight cane   Gait Pattern Step-through pattern;Decreased step length - right;Decreased step length - left;Right foot flat;Left foot flat;Wide base of support;Poor foot clearance -  left;Poor foot clearance - right   Ambulation Surface Level   Gait velocity 32.18/11.13=2.95 ft/sec     Standardized Balance Assessment   Standardized Balance Assessment Timed Up and Go Test;Dynamic Gait Index     Dynamic Gait Index   Level Surface Mild Impairment   Change in Gait Speed Mild Impairment   Gait with Horizontal Head Turns Moderate Impairment   Gait with Vertical Head Turns Mild Impairment   Gait and Pivot Turn Mild Impairment   Step Over Obstacle Mild Impairment   Step Around Obstacles Normal  Steps Mild Impairment   Total Score 16     Timed Up and Go Test   Normal TUG (seconds) 15.1  >13.5 sec indicates high fall risk                           PT Education - 09/02/16 1756    Education provided Yes   Education Details results of evaluation; PT POC   Person(s) Educated Patient   Methods Explanation   Comprehension Verbalized understanding          PT Short Term Goals - 09/02/16 1806      PT SHORT TERM GOAL #1   Title Patient will be independent with initial HEP (Target for all STGs 09/30/16)   Time 4   Period Weeks   Status New     PT SHORT TERM GOAL #2   Title Patient will improve DGI score to >=19 to indicate a lesser fall risk.    Time 4   Period Weeks   Status New     PT SHORT TERM GOAL #3   Title Patient will improve her TUG score to <14.0 seconds to indicate a lesser fall risk.    Time 4   Period Weeks   Status New     PT SHORT TERM GOAL #4   Title Patient will ambulate 360 ft with LRAD modified independent on level, indoor surface including negotiating obstacles and thresholds.    Time 4   Period Weeks   Status New           PT Long Term Goals - 09/02/16 1810      PT LONG TERM GOAL #1   Title Patient will be independent with updated HEP (Target for all STGs 09/30/16)   Time 8   Period Weeks   Status New     PT LONG TERM GOAL #2   Title Patient will improve DGI score to >=21 to indicate a lesser fall risk.    Time 8   Period Weeks   Status New     PT LONG TERM GOAL #3   Title Patient will improve her TUG score to <13.5 seconds to indicate a lesser fall risk.    Time 8   Period Weeks   Status New     PT LONG TERM GOAL #4   Title Patient will ambulate 500 ft with LRAD modified independent on unlevel, outdoor paved surfaces including negotiating single step/curb.    Time 8   Period Weeks   Status New               Plan - 09/02/16 1758    Clinical Impression Statement 83 yo referred to PT due to h/o falls and decreasing activity level due to a fear of falling. She has sustained multiple fractures over the years due to falls, with the most recent fall resulting in Lt patellar fx. Based on her DGI score of 16/28 and TUG time of 15.1 sec, she is at increased risk for falls currently. She will benefit from PT to address the deficits listed below through use of the interventions listed below. Anticipate good progress as pt appears motivated to improve her balance and gait.    Rehab Potential Good   Clinical Impairments Affecting Rehab Potential arthritis pain; fear of falling   PT Frequency 2x / week   PT Duration 8 weeks   PT Treatment/Interventions ADLs/Self Care Home Management;Functional mobility training;Stair training;Gait training;DME Instruction;Therapeutic activities;Therapeutic  exercise;Balance training;Neuromuscular re-education;Cognitive remediation;Patient/family education;Orthotic Fit/Training;Passive range of motion;Energy conservation   PT Next Visit Plan Initiate HEP for balance and strengthening (?[place]). Check cane height and educate in proper use.    Consulted and Agree with Plan of Care Patient      Patient will benefit from skilled therapeutic intervention in order to improve the following deficits and impairments:  Abnormal gait, Decreased balance, Decreased knowledge of use of DME, Decreased mobility, Decreased strength, Difficulty walking, Impaired UE functional  use, Postural dysfunction, Pain  Visit Diagnosis: Muscle weakness (generalized) - Plan: PT plan of care cert/re-cert  Unsteadiness on feet - Plan: PT plan of care cert/re-cert  Difficulty in walking, not elsewhere classified - Plan: PT plan of care cert/re-cert      G-Codes - 82/95/62 1814    Functional Limitation Mobility: Walking and moving around   Mobility: Walking and Moving Around Current Status [phone]) At least 20 percent but less than 40 percent impaired, limited or restricted   Mobility: Walking and Moving Around Goal Status (V7846) At least 1 percent but less than 20 percent impaired, limited or restricted       Problem List Patient Active Problem List   Diagnosis Date Noted  . Aortic stenosis, mild 01/15/2016  . Bilateral carotid bruits 05/11/2015  . Hyponatremia 04/19/2015  . Chronic anemia 04/19/2015  . Panic attack 04/19/2015  . Constipation 04/19/2015  . Hyperkalemia 04/17/2015  . Influenza A 04/16/2015  . Proximal humerus fracture   . Left patella fracture   . Benign essential HTN   . Esophageal reflux   . Chronic diastolic heart failure ([place])   . Thyroid activity decreased   . Lower GI bleed 04/08/2015  . Hematochezia   . Rectal bleeding 04/03/2015  . Patellar fracture 03/06/2015  . Left humeral fracture 11/04/2013  . Rib fracture 11/04/2013  . Hypothyroid 11/04/2013  . Hypertension 11/04/2013  . OSA (obstructive sleep apnea) 11/13/2008  . BOOP (bronchiolitis obliterans with organizing pneumonia) ([place]) 08/25/2008    Rexanne Mano, PT 09/02/2016, 6:17 PM  [facility] [address] [place], [place], [address] Phone: [phone]   Fax:  [phone]  Name: Carly Sabo MRN: [id number] Date of Birth: 1932/05/13

## 2016-09-08 ENCOUNTER — Ambulatory Visit: Payer: Medicare Other | Admitting: Physical Therapy

## 2016-09-13 ENCOUNTER — Encounter: Payer: Self-pay | Admitting: Pulmonary Disease

## 2016-09-13 ENCOUNTER — Ambulatory Visit (INDEPENDENT_AMBULATORY_CARE_PROVIDER_SITE_OTHER): Payer: Medicare Other | Admitting: Pulmonary Disease

## 2016-09-13 VITALS — BP 130/70 | HR 68 | Ht 63.5 in | Wt 202.6 lb

## 2016-09-13 DIAGNOSIS — R0602 Shortness of breath: Secondary | ICD-10-CM | POA: Diagnosis not present

## 2016-09-13 NOTE — Patient Instructions (Signed)
I'm glad you are feeling better. It's okay to stop using the breo Continue the albuterol rescue inhaler Return to clinic in 1 year. Please call us sooner if there is any change in his symptoms.

## 2016-09-13 NOTE — Progress Notes (Signed)
Lisa Mcclure    [id number]    1932/09/15  Primary Care Physician:Paterson, Quillian Quince, MD  Referring Physician: [name], [place] [address] [place], [place] [address]  Chief complaint:  Follow up for evaluation of dyspnea  HPI: 83 Y/O with obesity, chronic diastolic CHF, hypertensive heart disease, HTN, anemia, hemorrhoids, depression, diverticulosis, esophageal reflux, LBP, OAB, OSA, rheumatoid arthritis, venous insufficiency. She was seen in 2010 for a right upper lobe para mediastinal that resolved with antibiotics. This was thought to be secondary to an infection. She was also evaluated for rheumatoid arthritis but with negative CCP and RF. She saw Dr. Amil Amen who did not think she has RA.  She follows up with cardiology for worsening edema, dyspnea. She had a fall earlier this year with a shoulder fracture. She feels that this set her back significantly  in terms of her activity level, dyspnea and is slowly recovering from this incident. Echo noted with high ventricular filling pressures, elevated PA pressures, diastolic dysfunction, mild aortic stenosis. She was tried on diuretics but had cut back due to elevated Cr. A right and left heart cath has been suggested but she would like to get her lungs evaluated first. She had recent PFTs that show some mild restriction with DLCO impairment. She has tried breo with no improvement in symptoms. She was diagnosed with sleep apnea in 2012 but did not tolerate CPAP. She is just on supplemental oxygen at night.  Interval history: She continues to feel well with mild dyspnea on exertion at baseline. She denies any cough, wheezing, sputum production. She was given breo  but she is not using it since she feels well. She hardly needs to use her rescue inhaler.  She has canceled her planned cardiac catheterization and stress test by cardiology since she feels well. She is not tolerant of the CPAP and is hardly using her nocturnal oxygen  as well   Outpatient Encounter Prescriptions as of 09/13/2016  Medication Sig  . busPIRone (BUSPAR) 7.5 MG tablet Take 1 tablet (7.5 mg total) by mouth 3 (three) times daily. (Patient taking differently: Take 7.5 mg by mouth daily. )  . cholecalciferol (VITAMIN D) 1000 UNITS tablet Take 1,000 Units by mouth daily.  . fluticasone furoate-vilanterol (BREO ELLIPTA) 100-25 MCG/INH AEPB Inhale 1 puff into the lungs daily.  . furosemide (LASIX) 20 MG tablet Take 1 tablet (20 mg total) by mouth daily.  . hydrALAZINE (APRESOLINE) 25 MG tablet Take 1 tablet (25 mg total) by mouth 3 (three) times daily.  Marland Kitchen HYDROcodone-acetaminophen (NORCO) 7.5-325 MG tablet Take 1 tablet by mouth every 6 (six) hours as needed for moderate pain.  Marland Kitchen latanoprost (XALATAN) 0.005 % ophthalmic solution Place 1 drop into both eyes at bedtime.  Marland Kitchen levothyroxine (SYNTHROID, LEVOTHROID) 100 MCG tablet Take 100 mcg by mouth daily before breakfast.  . losartan (COZAAR) 100 MG tablet Take 100 mg by mouth daily.  . Melatonin 3 MG TABS Take 3 mg by mouth at bedtime.   . metoprolol (LOPRESSOR) 50 MG tablet Take 50 mg by mouth 2 (two) times daily.  . Multiple Vitamin (MULTI-DAY VITAMINS PO) Take 1 tablet by mouth daily.   . Multiple Vitamins-Minerals (ICAPS AREDS 2) CAPS Take 1 capsule by mouth 2 (two) times daily.   Marland Kitchen omeprazole (PRILOSEC) 20 MG capsule Take 20 mg by mouth 2 (two) times daily before a meal.   . potassium chloride SA (K-DUR,KLOR-CON) 20 MEQ tablet Take 1 tablet (20 mEq total)  by mouth daily.  Marland Kitchen rOPINIRole (REQUIP) 1 MG tablet TAKE 1 TABLET BY MOUTH EVERY EVENING FOR RESTLESS LEGS  . sennosides-docusate sodium (SENOKOT-S) 8.6-50 MG tablet Take 1 tablet by mouth daily. (Patient taking differently: Take 1 tablet by mouth daily as needed. )  . spironolactone (ALDACTONE) 25 MG tablet TAKE 1 TABLET BY MOUTH EVERY DAY  . [DISCONTINUED] azithromycin (ZITHROMAX) 250 MG tablet On DAY 1 take 2 tablets by mouth in the a.m. On DAY 2  take 1 tablet by mouth in the a.m. On DAY 3 take 1 tablet by mouth in the a.m On DAY 4 take 1 tablet by mouth in the a.m On DAY 5 take 1 tablet by mouth in the a.m   No facility-administered encounter medications on file as of 09/13/2016.     Allergies as of 09/13/2016 - Review Complete 09/13/2016  Allergen Reaction Noted  . Gabapentin  02/24/2016  . Robaxin [methocarbamol]  02/24/2016  . Statins  02/24/2016    Past Medical History:  Diagnosis Date  . Anemia    h/o hemorrhoidal bleeding and blood transfusion  . Cardiomegaly   . Chronic diastolic CHF (congestive heart failure) ([place])   . Depression   . Diverticulosis   . DOE (dyspnea on exertion)   . Esophageal reflux   . GERD (gastroesophageal reflux disease)   . Hypothyroidism   . Insomnia   . LBP (low back pain)   . OAB (overactive bladder)   . Obesity   . OSA (obstructive sleep apnea)   . Osteoarthritis   . Rheumatoid arthritis(714.0)   . Shoulder pain, bilateral   . Unspecified essential hypertension   . Venous insufficiency     Past Surgical History:  Procedure Laterality Date  . APPENDECTOMY  1953  . bladder abduction-1996  1996  . breast biopsy Right 1980  . [place]  . COSMETIC SURGERY  1996  . CYSTOCELE REPAIR    . FLEXIBLE SIGMOIDOSCOPY N/A 04/10/2015   Procedure: FLEXIBLE SIGMOIDOSCOPY;  Surgeon: Arta Silence, MD;  Location: [facility] ENDOSCOPY;  Service: Endoscopy;  Laterality: N/A;  . knee arthroscopy Right 1996, 2010  . KNEE ARTHROSCOPY W/ AUTOGENOUS CARTILAGE IMPLANTATION (ACI) PROCEDURE Left 1994, 1995  . SHOULDER SURGERY  1990  . TOTAL ABDOMINAL HYSTERECTOMY  1972  . VESICOVAGINAL FISTULA CLOSURE W/ TAH    . WRIST SURGERY  1967    Family History  Problem Relation Age of Onset  . COPD Father   . Lung cancer Mother   . Heart attack Maternal Grandmother 80  . Breast cancer Maternal Aunt   . Lung cancer Son     Social History   Social History  . Marital status:  Widowed    Spouse name: N/A  . Number of children: 3  . Years of education: College   Occupational History  . Retired    Social History Main Topics  . Smoking status: Former Smoker    Packs/day: 0.20    Years: 10.00    Types: Cigarettes    Quit date: 04/11/1984  . Smokeless tobacco: Never Used  . Alcohol use 0.0 oz/week     Comment: cocktail daily  . Drug use: No  . Sexual activity: Not on file   Other Topics Concern  . 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  HENT: Negative.   Eyes: Negative for blurred vision.  Respiratory: as per HPI  Cardiovascular: Negative for chest pain and palpitations.  Gastrointestinal: Negative for vomiting, diarrhea, blood per rectum. Genitourinary: Negative for dysuria, urgency, frequency and hematuria.  Musculoskeletal: Negative for myalgias, back pain and joint pain.  Skin: Negative for itching and rash.  Neurological: Negative for dizziness, tremors, focal weakness, seizures and loss of consciousness.  Endo/Heme/Allergies: Negative for environmental allergies.  Psychiatric/Behavioral: Negative for depression, suicidal ideas and hallucinations.  All other systems reviewed and are negative.  Physical Exam: Blood pressure 130/70, pulse 68, height 5' 3.5" (1.613 m), weight 202 lb 9.6 oz (91.9 kg), SpO2 94 %. Gen:      No acute distress HEENT:  EOMI, sclera anicteric Neck:     No masses; no thyromegaly Lungs:    Clear to auscultation bilaterally; normal respiratory effort CV:         Regular rate and rhythm; no murmurs Abd:      + bowel sounds; soft, non-tender; no palpable masses, no distension Ext:    No edema; adequate peripheral perfusion Skin:      Warm  and dry; no rash Neuro: alert and oriented x 3 Psych: normal mood and affect  Data Reviewed: CT high res 03/17/16- no definite evidence for interstitial lung disease. Coronary calcification, atherosclerosis. Chest x-ray 01/28/16-no active Pulmonary disease. Mild chronic interstitial changes. CT chest 01/17/11-mild residual scarring in the right upper lobe. CT chest 09/29/08-resolution of the right upper lobe paramediastinal mass. PET scan 09/02/08- Moderate uptake CT chest 08/25/08-right upper lobe paramediastinal mass. All images reviewed  Sleep test 09/22/15. - OSA,  AHI 44.7, and PLMI 23.2.  Echo 00/7/62 Normal LV systolic function; moderate LVH; grade 1 distolic dysfunction with elevated LV filling pressure; calcified aortic  valve with mild AS (mean gradient 13 mmHg); moderate LAE; trace  TR with mildly elevated pulmonary pressure.  PFTs 02/11/16 FVC 1.78 (72%] FEV1 1.45 [79%) F/F 81 TLC 76% DLCO 65%. Minimal restriction, mild diffusion defect.  Assessment:  #1 Evaluation for dyspnea All her imaging and PFTs reviewed. There is very mild restriction, diffusion reduction with no obstruction. She does have a question of rheumatoid arthritis with interstitial prominence on recent chest x-ray. However high-resolution CT of the chest does not show any ILD and repeat serologies for rheumatoid arthritis are negative.   Her symptoms are likely multifactorial from obesity, deconditioning. There is a suggestion of small airway disease with reduction in midflow rates and improvement after albuterol. As she is symptomatic off all inhalers will continue to observe her for now. She'll just keep her albuterol rescue inhaler to be used as needed    #2 OSA Non compliant with CPA and supplemental O2 at night.  Plan/Recommendations: - Continue Albuterol PRN  Return to clinic in 1 year. If she continues to be stable we will see PRN after that.   Marshell Garfinkel MD [facility] Pager [phone] 09/13/2016, 2:01 PM  CC: [name], MD

## 2016-09-14 ENCOUNTER — Ambulatory Visit: Payer: Medicare Other | Attending: Internal Medicine | Admitting: Physical Therapy

## 2016-09-14 ENCOUNTER — Encounter: Payer: Self-pay | Admitting: Physical Therapy

## 2016-09-14 DIAGNOSIS — M6281 Muscle weakness (generalized): Secondary | ICD-10-CM | POA: Insufficient documentation

## 2016-09-14 DIAGNOSIS — R262 Difficulty in walking, not elsewhere classified: Secondary | ICD-10-CM | POA: Diagnosis not present

## 2016-09-14 DIAGNOSIS — R2681 Unsteadiness on feet: Secondary | ICD-10-CM | POA: Diagnosis not present

## 2016-09-14 NOTE — Therapy (Signed)
[facility] [address] [place], [place], [address] Phone: [phone]   Fax:  [phone]  Physical Therapy Treatment  Patient Details  Name: Lisa Mcclure MRN: [id number] Date of Birth: [birth year] Referring Provider: [name]  Encounter Date: 09/14/2016      PT End of Session - 09/14/16 1536    Visit Number 2   Number of Visits 17   Date for PT Re-Evaluation 10/28/16   Authorization Type Medicare; G-Code   PT Start Time 1533   PT Stop Time 1618   PT Time Calculation (min) 45 min   Activity Tolerance Patient tolerated treatment well   Behavior During Therapy [facility] for tasks assessed/performed      Past Medical History:  Diagnosis Date  . Anemia    h/o hemorrhoidal bleeding and blood transfusion  . Cardiomegaly   . Chronic diastolic CHF (congestive heart failure) ([place])   . Depression   . Diverticulosis   . DOE (dyspnea on exertion)   . Esophageal reflux   . GERD (gastroesophageal reflux disease)   . Hypothyroidism   . Insomnia   . LBP (low back pain)   . OAB (overactive bladder)   . Obesity   . OSA (obstructive sleep apnea)   . Osteoarthritis   . Rheumatoid arthritis(714.0)   . Shoulder pain, bilateral   . Unspecified essential hypertension   . Venous insufficiency     Past Surgical History:  Procedure Laterality Date  . APPENDECTOMY  1953  . bladder abduction-1996  1996  . breast biopsy Right 1980  . [place]  . COSMETIC SURGERY  1996  . CYSTOCELE REPAIR    . FLEXIBLE SIGMOIDOSCOPY N/A 04/10/2015   Procedure: FLEXIBLE SIGMOIDOSCOPY;  Surgeon: Arta Silence, MD;  Location: [facility] ENDOSCOPY;  Service: Endoscopy;  Laterality: N/A;  . knee arthroscopy Right 1996, 2010  . KNEE ARTHROSCOPY W/ AUTOGENOUS CARTILAGE IMPLANTATION (ACI) PROCEDURE Left 1994, 1995  . SHOULDER SURGERY  1990  . TOTAL ABDOMINAL HYSTERECTOMY  1972  . VESICOVAGINAL FISTULA CLOSURE W/ TAH     . WRIST SURGERY  1967    There were no vitals filed for this visit.      Subjective Assessment - 09/14/16 1534    Subjective No new complaints. No new falls to report.    Patient Stated Goals Be able to walk up and down a few steps; be able to go downtown to the events/galleries (uneven pavement)   Currently in Pain? Yes   Pain Score 8    Pain Location Knee   Pain Orientation Left   Pain Descriptors / Indicators Aching   Pain Type Chronic pain   Pain Onset More than a month ago   Pain Frequency Intermittent   Aggravating Factors  unknown   Pain Relieving Factors pain pill this am, standing with pressure on it            [facility] Adult PT Treatment/Exercise - 09/14/16 1537      Transfers   Transfers Sit to Stand;Stand to Sit   Sit to Stand 6: Modified independent (Device/Increase time);With upper extremity assist;With armrests   Stand to Sit 6: Modified independent (Device/Increase time);With armrests     Ambulation/Gait   Ambulation/Gait Yes   Ambulation/Gait Assistance 5: Supervision   Ambulation/Gait Assistance Details cues on sequencing with cane and for increased step length   Ambulation Distance (Feet) 100 Feet  x1, 115    Assistive device Straight cane  Gait Pattern Step-through pattern;Decreased step length - right;Decreased step length - left;Right foot flat;Left foot flat;Wide base of support;Decreased trunk rotation   Ambulation Surface Level;Indoor             Balance Exercises - 09/14/16 1546      OTAGO PROGRAM   Head Movements Standing;5 reps   Neck Movements Standing;5 reps   Back Extension Standing;5 reps   Trunk Movements Standing;5 reps   Ankle Movements Sitting;10 reps   Knee Extensor 10 reps  UE support   Knee Flexor 10 reps  UE support   Hip ABductor 10 reps  UE support   Ankle Plantorflexors 20 reps, support   Ankle Dorsiflexors 20 reps, support             PT Short Term Goals - 09/02/16 1806      PT SHORT TERM GOAL #1    Title Patient will be independent with initial HEP (Target for all STGs 09/30/16)   Time 4   Period Weeks   Status New     PT SHORT TERM GOAL #2   Title Patient will improve DGI score to >=19 to indicate a lesser fall risk.    Time 4   Period Weeks   Status New     PT SHORT TERM GOAL #3   Title Patient will improve her TUG score to <14.0 seconds to indicate a lesser fall risk.    Time 4   Period Weeks   Status New     PT SHORT TERM GOAL #4   Title Patient will ambulate 360 ft with LRAD modified independent on level, indoor surface including negotiating obstacles and thresholds.    Time 4   Period Weeks   Status New           PT Long Term Goals - 09/02/16 1810      PT LONG TERM GOAL #1   Title Patient will be independent with updated HEP (Target for all STGs 09/30/16)   Time 8   Period Weeks   Status New     PT LONG TERM GOAL #2   Title Patient will improve DGI score to >=21 to indicate a lesser fall risk.   Time 8   Period Weeks   Status New     PT LONG TERM GOAL #3   Title Patient will improve her TUG score to <13.5 seconds to indicate a lesser fall risk.    Time 8   Period Weeks   Status New     PT LONG TERM GOAL #4   Title Patient will ambulate 500 ft with LRAD modified independent on unlevel, outdoor paved surfaces including negotiating single step/curb.    Time 8   Period Weeks   Status New            Plan - 09/14/16 1536    Clinical Impression Statement today's skilled session focused on gait with cane (after adjusting to proper height) and on initiation of HEP. Started pt with OTAGO program today with no issues reported. Pt is progressing toward goals and should benefit from continued PT to progress toward unmet goals.    Rehab Potential Good   Clinical Impairments Affecting Rehab Potential arthritis pain; fear of falling   PT Frequency 2x / week   PT Duration 8 weeks   PT Treatment/Interventions ADLs/Self Care Home Management;Functional  mobility training;Stair training;Gait training;DME Instruction;Therapeutic activities;Therapeutic exercise;Balance training;Neuromuscular re-education;Cognitive remediation;Patient/family education;Orthotic Fit/Training;Passive range of motion;Energy conservation   PT Next Visit Plan  complete OTAGO program for HEP;continue to work on balance/LE Geophysical data processor with Plan of Care Patient      Patient will benefit from skilled therapeutic intervention in order to improve the following deficits and impairments:  Abnormal gait, Decreased balance, Decreased knowledge of use of DME, Decreased mobility, Decreased strength, Difficulty walking, Impaired UE functional use, Postural dysfunction, Pain  Visit Diagnosis: Muscle weakness (generalized)  Unsteadiness on feet  Difficulty in walking, not elsewhere classified     Problem List Patient Active Problem List   Diagnosis Date Noted  . Aortic stenosis, mild 01/15/2016  . Bilateral carotid bruits 05/11/2015  . Hyponatremia 04/19/2015  . Chronic anemia 04/19/2015  . Panic attack 04/19/2015  . Constipation 04/19/2015  . Hyperkalemia 04/17/2015  . Influenza A 04/16/2015  . Proximal humerus fracture   . Left patella fracture   . Benign essential HTN   . Esophageal reflux   . Chronic diastolic heart failure ([place])   . Thyroid activity decreased   . Lower GI bleed 04/08/2015  . Hematochezia   . Rectal bleeding 04/03/2015  . Patellar fracture 03/06/2015  . Left humeral fracture 11/04/2013  . Rib fracture 11/04/2013  . Hypothyroid 11/04/2013  . Hypertension 11/04/2013  . OSA (obstructive sleep apnea) 11/13/2008  . BOOP (bronchiolitis obliterans with organizing pneumonia) ([place]) 08/25/2008    Willow Ora, PTA, [facility] [address], [place], [place] [address] [phone] 09/14/16, 4:26 PM   Name: Lisa Mcclure MRN: [id number] Date of Birth: [birth year]

## 2016-09-16 ENCOUNTER — Encounter: Payer: Self-pay | Admitting: Physical Therapy

## 2016-09-16 ENCOUNTER — Ambulatory Visit: Payer: Medicare Other | Admitting: Physical Therapy

## 2016-09-16 VITALS — BP 155/86 | HR 57

## 2016-09-16 DIAGNOSIS — M6281 Muscle weakness (generalized): Secondary | ICD-10-CM

## 2016-09-16 DIAGNOSIS — R2681 Unsteadiness on feet: Secondary | ICD-10-CM | POA: Diagnosis not present

## 2016-09-16 DIAGNOSIS — R262 Difficulty in walking, not elsewhere classified: Secondary | ICD-10-CM | POA: Diagnosis not present

## 2016-09-16 NOTE — Therapy (Signed)
[facility] [address] [place], [place], [address] Phone: [phone]   Fax:  [phone]  Physical Therapy Treatment  Patient Details  Name: Lisa Mcclure MRN: [id number] Date of Birth: Sep 25, 1932 Referring Provider: [name]  Encounter Date: 09/16/2016      PT End of Session - 09/16/16 1747    Visit Number 3   Number of Visits 17   Date for PT Re-Evaluation 10/28/16   Authorization Type Medicare; G-Code   PT Start Time 1093   PT Stop Time 1532   PT Time Calculation (min) 40 min   Activity Tolerance Patient limited by pain   Behavior During Therapy [facility] for tasks assessed/performed      Past Medical History:  Diagnosis Date  . Anemia    h/o hemorrhoidal bleeding and blood transfusion  . Cardiomegaly   . Chronic diastolic CHF (congestive heart failure) ([place])   . Depression   . Diverticulosis   . DOE (dyspnea on exertion)   . Esophageal reflux   . GERD (gastroesophageal reflux disease)   . Hypothyroidism   . Insomnia   . LBP (low back pain)   . OAB (overactive bladder)   . Obesity   . OSA (obstructive sleep apnea)   . Osteoarthritis   . Rheumatoid arthritis(714.0)   . Shoulder pain, bilateral   . Unspecified essential hypertension   . Venous insufficiency     Past Surgical History:  Procedure Laterality Date  . APPENDECTOMY  1953  . bladder abduction-1996  1996  . breast biopsy Right 1980  . [place]  . COSMETIC SURGERY  1996  . CYSTOCELE REPAIR    . FLEXIBLE SIGMOIDOSCOPY N/A 04/10/2015   Procedure: FLEXIBLE SIGMOIDOSCOPY;  Surgeon: Arta Silence, MD;  Location: [facility] ENDOSCOPY;  Service: Endoscopy;  Laterality: N/A;  . knee arthroscopy Right 1996, 2010  . KNEE ARTHROSCOPY W/ AUTOGENOUS CARTILAGE IMPLANTATION (ACI) PROCEDURE Left 1994, 1995  . SHOULDER SURGERY  1990  . TOTAL ABDOMINAL HYSTERECTOMY  1972  . VESICOVAGINAL FISTULA CLOSURE W/ TAH    .  WRIST SURGERY  1967    Vitals:   09/16/16 1457  BP: (!) 155/86  Pulse: (!) 57  SpO2: 93%        Subjective Assessment - 09/16/16 1457    Subjective No new issues or falls, was a little sore after exercises on Wednesday but it has improved.  Reports she would like to work on ramp carrying objects and stairs.  Did not sleep well last night.   Patient Stated Goals Be able to walk up and down a few steps; be able to go downtown to the events/galleries (uneven pavement)   Currently in Pain? Yes   Pain Score 5    Pain Location Knee   Pain Orientation Left   Pain Descriptors / Indicators Aching   Pain Type Chronic pain                         [facility] Adult PT Treatment/Exercise - 09/16/16 1515      Ambulation/Gait   Ramp 4: Min assist   Ramp Details (indicate cue type and reason) forwards ascending, retro descending x 10 reps; attempted the reverse technique retro to ascend but caused increased knee pain-activity ceased.     Exercises   Exercises Knee/Hip     Knee/Hip Exercises: Standing   Hip Extension Stengthening;Right;Left;10 reps;Knee straight   Lateral Step Up Right;Left;1  set;10 reps;Hand Hold: 2;Step Height: 2"   Forward Step Up Right;Left;1 set;10 reps;Hand Hold: 2;Step Height: 2"   Step Down Left;Right;1 set;10 reps;Hand Hold: 2;Step Height: 2"   Wall Squat 1 set  knee pain, had to cease after one             Balance Exercises - 09/16/16 1533      Balance Exercises: Standing   Marching Limitations in // bars x 2 reps (12 steps x 2) with supervision-high knee marches forwards           PT Education - 09/16/16 1747    Education provided Yes   Education Details reasons for pain in knee during closed chain, eccentric step downs   Person(s) Educated Patient   Methods Explanation   Comprehension Verbalized understanding          PT Short Term Goals - 09/02/16 1806      PT SHORT TERM GOAL #1   Title Patient will be independent with  initial HEP (Target for all STGs 09/30/16)   Time 4   Period Weeks   Status New     PT SHORT TERM GOAL #2   Title Patient will improve DGI score to >=19 to indicate a lesser fall risk.    Time 4   Period Weeks   Status New     PT SHORT TERM GOAL #3   Title Patient will improve her TUG score to <14.0 seconds to indicate a lesser fall risk.    Time 4   Period Weeks   Status New     PT SHORT TERM GOAL #4   Title Patient will ambulate 360 ft with LRAD modified independent on level, indoor surface including negotiating obstacles and thresholds.    Time 4   Period Weeks   Status New           PT Long Term Goals - 09/02/16 1810      PT LONG TERM GOAL #1   Title Patient will be independent with updated HEP (Target for all STGs 09/30/16)   Time 8   Period Weeks   Status New     PT LONG TERM GOAL #2   Title Patient will improve DGI score to >=21 to indicate a lesser fall risk.   Time 8   Period Weeks   Status New     PT LONG TERM GOAL #3   Title Patient will improve her TUG score to <13.5 seconds to indicate a lesser fall risk.    Time 8   Period Weeks   Status New     PT LONG TERM GOAL #4   Title Patient will ambulate 500 ft with LRAD modified independent on unlevel, outdoor paved surfaces including negotiating single step/curb.    Time 8   Period Weeks   Status New               Plan - 09/16/16 1748    Clinical Impression Statement Continued to focus on functional LE strengthening with incorporation of pt goals to be able to negotiate a few stairs and training on ramp due to steepness of ramp at home.  During LE strengthening pt reported significant pain in knee with closed chain exercises (wall squat) and eccentric contraction during step downs.  Tolerated concentric step ups and open chain exercises.  Will continue to address and progress as pt tolerates.     Rehab Potential Good   Clinical Impairments Affecting Rehab Potential arthritis pain; fear of  falling    PT Frequency 2x / week   PT Duration 8 weeks   PT Treatment/Interventions ADLs/Self Care Home Management;Functional mobility training;Stair training;Gait training;DME Instruction;Therapeutic activities;Therapeutic exercise;Balance training;Neuromuscular re-education;Cognitive remediation;Patient/family education;Orthotic Fit/Training;Passive range of motion;Energy conservation   PT Next Visit Plan complete OTAGO program for HEP;continue to work on balance/LE strengthening-closed chain is very painful on knees-may need to focus more on open chain strengthening; ramp and stairs-gradually increase step height, gait outside on pavement   Consulted and Agree with Plan of Care Patient      Patient will benefit from skilled therapeutic intervention in order to improve the following deficits and impairments:  Abnormal gait, Decreased balance, Decreased knowledge of use of DME, Decreased mobility, Decreased strength, Difficulty walking, Impaired UE functional use, Postural dysfunction, Pain  Visit Diagnosis: Muscle weakness (generalized)  Unsteadiness on feet  Difficulty in walking, not elsewhere classified     Problem List Patient Active Problem List   Diagnosis Date Noted  . Aortic stenosis, mild 01/15/2016  . Bilateral carotid bruits 05/11/2015  . Hyponatremia 04/19/2015  . Chronic anemia 04/19/2015  . Panic attack 04/19/2015  . Constipation 04/19/2015  . Hyperkalemia 04/17/2015  . Influenza A 04/16/2015  . Proximal humerus fracture   . Left patella fracture   . Benign essential HTN   . Esophageal reflux   . Chronic diastolic heart failure ([place])   . Thyroid activity decreased   . Lower GI bleed 04/08/2015  . Hematochezia   . Rectal bleeding 04/03/2015  . Patellar fracture 03/06/2015  . Left humeral fracture 11/04/2013  . Rib fracture 11/04/2013  . Hypothyroid 11/04/2013  . Hypertension 11/04/2013  . OSA (obstructive sleep apnea) 11/13/2008  . BOOP (bronchiolitis obliterans  with organizing pneumonia) ([place]) 08/25/2008   Raylene Everts, PT, DPT 09/16/16    5:52 PM    [facility] [address] [place], [place], [address] Phone: [phone]   Fax:  [phone]  Name: Raja Caputi MRN: [id number] Date of Birth: [birth year]

## 2016-09-22 ENCOUNTER — Encounter: Payer: Self-pay | Admitting: Physical Therapy

## 2016-09-22 ENCOUNTER — Ambulatory Visit: Payer: Medicare Other | Admitting: Physical Therapy

## 2016-09-22 DIAGNOSIS — R2681 Unsteadiness on feet: Secondary | ICD-10-CM

## 2016-09-22 DIAGNOSIS — M6281 Muscle weakness (generalized): Secondary | ICD-10-CM

## 2016-09-22 DIAGNOSIS — R262 Difficulty in walking, not elsewhere classified: Secondary | ICD-10-CM

## 2016-09-22 NOTE — Therapy (Addendum)
[facility] [address] [place], [place], [address] Phone: [phone]   Fax:  [phone]  Physical Therapy Treatment  Patient Details  Name: Lisa Mcclure MRN: [id number] Date of Birth: 04-05-1933 Referring Provider: [name]  Encounter Date: 09/22/2016      PT End of Session - 09/22/16 1623    Visit Number 4   Number of Visits 17   Date for PT Re-Evaluation 10/28/16   Authorization Type Medicare; G-Code   PT Start Time 5364   PT Stop Time 1620   PT Time Calculation (min) 42 min   Activity Tolerance Patient limited by pain   Behavior During Therapy [facility] for tasks assessed/performed      Past Medical History:  Diagnosis Date  . Anemia    h/o hemorrhoidal bleeding and blood transfusion  . Cardiomegaly   . Chronic diastolic CHF (congestive heart failure) ([place])   . Depression   . Diverticulosis   . DOE (dyspnea on exertion)   . Esophageal reflux   . GERD (gastroesophageal reflux disease)   . Hypothyroidism   . Insomnia   . LBP (low back pain)   . OAB (overactive bladder)   . Obesity   . OSA (obstructive sleep apnea)   . Osteoarthritis   . Rheumatoid arthritis(714.0)   . Shoulder pain, bilateral   . Unspecified essential hypertension   . Venous insufficiency     Past Surgical History:  Procedure Laterality Date  . APPENDECTOMY  1953  . bladder abduction-1996  1996  . breast biopsy Right 1980  . [place]  . COSMETIC SURGERY  1996  . CYSTOCELE REPAIR    . FLEXIBLE SIGMOIDOSCOPY N/A 04/10/2015   Procedure: FLEXIBLE SIGMOIDOSCOPY;  Surgeon: Arta Silence, MD;  Location: [facility] ENDOSCOPY;  Service: Endoscopy;  Laterality: N/A;  . knee arthroscopy Right 1996, 2010  . KNEE ARTHROSCOPY W/ AUTOGENOUS CARTILAGE IMPLANTATION (ACI) PROCEDURE Left 1994, 1995  . SHOULDER SURGERY  1990  . TOTAL ABDOMINAL HYSTERECTOMY  1972  . VESICOVAGINAL FISTULA CLOSURE W/ TAH    .  WRIST SURGERY  1967    There were no vitals filed for this visit.      Subjective Assessment - 09/22/16 1547    Subjective Pt reports feeling achy from head to toe.  Has not been sleeping well; has a friend dying of cancer.  Pt began by discussing the death of her son due to rapid, progressive CA.   Patient Stated Goals Be able to walk up and down a few steps; be able to go downtown to the events/galleries (uneven pavement)   Currently in Pain? Yes   Pain Location Shoulder   Pain Orientation Right;Left   Pain Descriptors / Indicators Aching   Pain Type Chronic pain                              Balance Exercises - 09/22/16 1607      Balance Exercises: Standing   Rockerboard Anterior/posterior;Head turns;EO;EC  feet apart, R/L staggered stance     OTAGO PROGRAM   Trunk Movements Standing  10 reps to L and R   Knee Flexor 10 reps  standing   Hip ABductor 10 reps   Ankle Plantorflexors 20 reps, support   Ankle Dorsiflexors 20 reps, support   Knee Bends 10 reps, support   Backwards Walking Support   Walking and Turning Around Assistive device  Sideways Walking Assistive device   One Leg Stand 10 seconds, support   Overall OTAGO Comments standing on blue mat for increased balance challenge           PT Education - 09/22/16 1623    Education provided Yes   Education Details Add SLS, retro gait and lateral stepping to HEP with UE support on countertop   Person(s) Educated Patient   Methods Explanation   Comprehension Verbalized understanding;Returned demonstration          PT Short Term Goals - 09/02/16 1806      PT SHORT TERM GOAL #1   Title Patient will be independent with initial HEP (Target for all STGs 09/30/16)   Time 4   Period Weeks   Status New     PT SHORT TERM GOAL #2   Title Patient will improve DGI score to >=19 to indicate a lesser fall risk.    Time 4   Period Weeks   Status New     PT SHORT TERM GOAL #3   Title  Patient will improve her TUG score to <14.0 seconds to indicate a lesser fall risk.    Time 4   Period Weeks   Status New     PT SHORT TERM GOAL #4   Title Patient will ambulate 360 ft with LRAD modified independent on level, indoor surface including negotiating obstacles and thresholds.    Time 4   Period Weeks   Status New           PT Long Term Goals - 09/23/16 0739      PT LONG TERM GOAL #1   Title Patient will be independent with updated HEP (Target for all LTGs 11/01/16)   Time 8   Period Weeks   Status New     PT LONG TERM GOAL #2   Title Patient will improve DGI score to >=21 to indicate a lesser fall risk.   Time 8   Period Weeks   Status New     PT LONG TERM GOAL #3   Title Patient will improve her TUG score to <13.5 seconds to indicate a lesser fall risk.    Time 8   Period Weeks   Status New     PT LONG TERM GOAL #4   Title Patient will ambulate 500 ft with LRAD modified independent on unlevel, outdoor paved surfaces including negotiating single step/curb.    Time 8   Period Weeks   Status New               Plan - 09/22/16 1623    Clinical Impression Statement Continued to focus on OTAGO HEP and progression of exercises.  Performed prescribed exercises on mat for compliant surface and increased balance challenge.  Pt able to tolerate adding SLS, retro gait, side stepping and mini squats (sitting hips back to countertop) to HEP.  Transitioned to working on ankle and hip strategy and weight shift training on rocker board.  Pt tolerated well with no increase in pain.  Will continue to address and progress.   Rehab Potential Good   Clinical Impairments Affecting Rehab Potential arthritis pain; fear of falling   PT Frequency 2x / week   PT Duration 8 weeks   PT Treatment/Interventions ADLs/Self Care Home Management;Functional mobility training;Stair training;Gait training;DME Instruction;Therapeutic activities;Therapeutic exercise;Balance  training;Neuromuscular re-education;Cognitive remediation;Patient/family education;Orthotic Fit/Training;Passive range of motion;Energy conservation   PT Next Visit Plan complete OTAGO program for HEP-continue to add higher level balance challenges (  heel-toe, heel walk, toe walk as pt pain tolerates);continue to work on balance/LE strengthening-closed chain is very painful on knees-may need to focus more on open chain strengthening; ramp and stairs-gradually increase step height, gait outside on pavement   Consulted and Agree with Plan of Care Patient      Patient will benefit from skilled therapeutic intervention in order to improve the following deficits and impairments:  Abnormal gait, Decreased balance, Decreased knowledge of use of DME, Decreased mobility, Decreased strength, Difficulty walking, Impaired UE functional use, Postural dysfunction, Pain  Visit Diagnosis: Muscle weakness (generalized)  Unsteadiness on feet  Difficulty in walking, not elsewhere classified     Problem List Patient Active Problem List   Diagnosis Date Noted  . Aortic stenosis, mild 01/15/2016  . Bilateral carotid bruits 05/11/2015  . Hyponatremia 04/19/2015  . Chronic anemia 04/19/2015  . Panic attack 04/19/2015  . Constipation 04/19/2015  . Hyperkalemia 04/17/2015  . Influenza A 04/16/2015  . Proximal humerus fracture   . Left patella fracture   . Benign essential HTN   . Esophageal reflux   . Chronic diastolic heart failure ([place])   . Thyroid activity decreased   . Lower GI bleed 04/08/2015  . Hematochezia   . Rectal bleeding 04/03/2015  . Patellar fracture 03/06/2015  . Left humeral fracture 11/04/2013  . Rib fracture 11/04/2013  . Hypothyroid 11/04/2013  . Hypertension 11/04/2013  . OSA (obstructive sleep apnea) 11/13/2008  . BOOP (bronchiolitis obliterans with organizing pneumonia) ([place]) 08/25/2008    Raylene Everts, PT, DPT 09/22/16    4:27 PM    [facility] [address] [place], [place], [address] Phone: [phone]   Fax:  [phone]  Name: Jazmen Lindenbaum MRN: [id number] Date of Birth: May 13, 1932

## 2016-09-28 ENCOUNTER — Ambulatory Visit: Payer: Medicare Other | Admitting: Physical Therapy

## 2016-09-28 ENCOUNTER — Encounter: Payer: Self-pay | Admitting: Physical Therapy

## 2016-09-28 DIAGNOSIS — M6281 Muscle weakness (generalized): Secondary | ICD-10-CM

## 2016-09-28 DIAGNOSIS — R262 Difficulty in walking, not elsewhere classified: Secondary | ICD-10-CM

## 2016-09-28 DIAGNOSIS — R2681 Unsteadiness on feet: Secondary | ICD-10-CM | POA: Diagnosis not present

## 2016-09-28 NOTE — Therapy (Signed)
[facility] [address] [place], [place], [address] Phone: [phone]   Fax:  [phone]  Physical Therapy Treatment  Patient Details  Name: Lisa Mcclure MRN: [id number] Date of Birth: Jan 22, 1933 Referring Provider: [name]  Encounter Date: 09/28/2016      PT End of Session - 09/28/16 1530    Visit Number 5   Number of Visits 17   Date for PT Re-Evaluation 10/28/16   Authorization Type Medicare; G-Code   PT Start Time 4709   PT Stop Time 1534   PT Time Calculation (min) 40 min   Activity Tolerance Patient limited by pain   Behavior During Therapy [facility] for tasks assessed/performed      Past Medical History:  Diagnosis Date  . Anemia    h/o hemorrhoidal bleeding and blood transfusion  . Cardiomegaly   . Chronic diastolic CHF (congestive heart failure) ([place])   . Depression   . Diverticulosis   . DOE (dyspnea on exertion)   . Esophageal reflux   . GERD (gastroesophageal reflux disease)   . Hypothyroidism   . Insomnia   . LBP (low back pain)   . OAB (overactive bladder)   . Obesity   . OSA (obstructive sleep apnea)   . Osteoarthritis   . Rheumatoid arthritis(714.0)   . Shoulder pain, bilateral   . Unspecified essential hypertension   . Venous insufficiency     Past Surgical History:  Procedure Laterality Date  . APPENDECTOMY  1953  . bladder abduction-1996  1996  . breast biopsy Right 1980  . [place]  . COSMETIC SURGERY  1996  . CYSTOCELE REPAIR    . FLEXIBLE SIGMOIDOSCOPY N/A 04/10/2015   Procedure: FLEXIBLE SIGMOIDOSCOPY;  Surgeon: Arta Silence, MD;  Location: [facility] ENDOSCOPY;  Service: Endoscopy;  Laterality: N/A;  . knee arthroscopy Right 1996, 2010  . KNEE ARTHROSCOPY W/ AUTOGENOUS CARTILAGE IMPLANTATION (ACI) PROCEDURE Left 1994, 1995  . SHOULDER SURGERY  1990  . TOTAL ABDOMINAL HYSTERECTOMY  1972  . VESICOVAGINAL FISTULA CLOSURE W/ TAH    .  WRIST SURGERY  1967    There were no vitals filed for this visit.      Subjective Assessment - 09/28/16 1500    Subjective Pt pain is improved, shoulder still painful.  Pt is sleeping better.     Patient Stated Goals Be able to walk up and down a few steps; be able to go downtown to the events/galleries (uneven pavement)   Currently in Pain? Yes  frozen shoulder            [facility] PT Assessment - 09/28/16 1503      Standardized Balance Assessment   Standardized Balance Assessment Timed Up and Go Test;Dynamic Gait Index     Dynamic Gait Index   Level Surface Normal   Change in Gait Speed Mild Impairment   Gait with Horizontal Head Turns Normal   Gait with Vertical Head Turns Normal   Gait and Pivot Turn Normal   Step Over Obstacle Mild Impairment   Step Around Obstacles Mild Impairment   Steps Mild Impairment   Total Score 20   DGI comment: 20/24     Timed Up and Go Test   TUG Normal TUG   Normal TUG (seconds) 10.77                          Balance Exercises - 09/28/16 1518  Balance Exercises: Standing   Tandem Stance Eyes open;Upper extremity support 1;2 reps  R and L foot forwards   Tandem Gait Forward;Upper extremity support;4 reps     OTAGO PROGRAM   Heel Walking Support  x 4 reps   Toe Walk Support  x 4 reps           PT Education - 09/28/16 1529    Education provided Yes   Education Details Added tandem gait, heel walking, toe walking and heel-toe stance to [place] [facility] [employer]) Educated Patient   Methods Explanation;Demonstration;Handout   Comprehension Verbalized understanding;Returned demonstration          PT Short Term Goals - 09/28/16 1512      PT SHORT TERM GOAL #1   Title Patient will be independent with initial HEP (Target for all STGs 09/30/16)   Time 4   Period Weeks   Status Achieved     PT SHORT TERM GOAL #2   Title Patient will improve DGI score to >=19 to indicate a lesser fall risk.     Baseline 20/24   Time 4   Period Weeks   Status Achieved     PT SHORT TERM GOAL #3   Title Patient will improve her TUG score to <14.0 seconds to indicate a lesser fall risk.    Baseline 10.77 seconds   Time 4   Period Weeks   Status Achieved     PT SHORT TERM GOAL #4   Title Patient will ambulate 360 ft with LRAD modified independent on level, indoor surface including negotiating obstacles and thresholds.    Time 4   Period Weeks   Status Achieved           PT Long Term Goals - 09/28/16 1513      PT LONG TERM GOAL #1   Title Patient will be independent with updated HEP (Target for all LTGs 11/01/16)   Time 8   Period Weeks   Status New     PT LONG TERM GOAL #2   Title Patient will improve DGI score to >=21 to indicate a lesser fall risk.   Time 8   Period Weeks   Status New     PT LONG TERM GOAL #3   Title Patient will improve her TUG score to <13.5 seconds to indicate a lesser fall risk.    Baseline 10.77 on 09/28/16   Time 8   Period Weeks   Status Achieved     PT LONG TERM GOAL #4   Title Patient will ambulate 500 ft with LRAD modified independent on unlevel, outdoor paved surfaces including negotiating single step/curb.    Time 8   Period Weeks   Status New               Plan - 09/28/16 1642    Clinical Impression Statement Treatment session focused on re-assessment of STG with pt achieving all STG and meeting one LTG.  Pt is demonstrating improved LE strength and balance with transfers, gait and changes in direction.  Increased balance challenge by adding to OTAGO HEP: tandem gait forwards, heel walking ,toe walking, tandem stance.  Will continue to progress as pt tolerates.   Rehab Potential Good   Clinical Impairments Affecting Rehab Potential arthritis pain; fear of falling   PT Frequency 2x / week   PT Duration 8 weeks   PT Treatment/Interventions ADLs/Self Care Home Management;Functional mobility training;Stair training;Gait training;DME  Instruction;Therapeutic activities;Therapeutic exercise;Balance training;Neuromuscular re-education;Cognitive  remediation;Patient/family education;Orthotic Fit/Training;Passive range of motion;Energy conservation   PT Next Visit Plan complete OTAGO program for HEP-continue to add higher level balance challenges sit <> stand-one hand, heel-toe walking backwards.  continue to work on balance/LE strengthening-closed chain is very painful on knees-may need to focus more on open chain strengthening; ramp and stairs-gradually increase step height, gait outside on pavement   Consulted and Agree with Plan of Care Patient      Patient will benefit from skilled therapeutic intervention in order to improve the following deficits and impairments:  Abnormal gait, Decreased balance, Decreased knowledge of use of DME, Decreased mobility, Decreased strength, Difficulty walking, Impaired UE functional use, Postural dysfunction, Pain  Visit Diagnosis: Muscle weakness (generalized)  Unsteadiness on feet  Difficulty in walking, not elsewhere classified     Problem List Patient Active Problem List   Diagnosis Date Noted  . Aortic stenosis, mild 01/15/2016  . Bilateral carotid bruits 05/11/2015  . Hyponatremia 04/19/2015  . Chronic anemia 04/19/2015  . Panic attack 04/19/2015  . Constipation 04/19/2015  . Hyperkalemia 04/17/2015  . Influenza A 04/16/2015  . Proximal humerus fracture   . Left patella fracture   . Benign essential HTN   . Esophageal reflux   . Chronic diastolic heart failure ([place])   . Thyroid activity decreased   . Lower GI bleed 04/08/2015  . Hematochezia   . Rectal bleeding 04/03/2015  . Patellar fracture 03/06/2015  . Left humeral fracture 11/04/2013  . Rib fracture 11/04/2013  . Hypothyroid 11/04/2013  . Hypertension 11/04/2013  . OSA (obstructive sleep apnea) 11/13/2008  . BOOP (bronchiolitis obliterans with organizing pneumonia) ([place]) 08/25/2008   Raylene Everts, PT,  DPT 09/28/16    4:45 PM    [facility] [address] [place], [place], [address] Phone: [phone]   Fax:  [phone]  Name: Angelica Frandsen MRN: [id number] Date of Birth: [birth year]

## 2016-09-30 ENCOUNTER — Ambulatory Visit: Payer: Medicare Other | Admitting: Physical Therapy

## 2016-09-30 DIAGNOSIS — G4733 Obstructive sleep apnea (adult) (pediatric): Secondary | ICD-10-CM | POA: Diagnosis not present

## 2016-09-30 DIAGNOSIS — R2689 Other abnormalities of gait and mobility: Secondary | ICD-10-CM | POA: Diagnosis not present

## 2016-09-30 DIAGNOSIS — Z1389 Encounter for screening for other disorder: Secondary | ICD-10-CM | POA: Diagnosis not present

## 2016-09-30 DIAGNOSIS — I10 Essential (primary) hypertension: Secondary | ICD-10-CM | POA: Diagnosis not present

## 2016-09-30 DIAGNOSIS — Z6836 Body mass index (BMI) 36.0-36.9, adult: Secondary | ICD-10-CM | POA: Diagnosis not present

## 2016-09-30 DIAGNOSIS — I5032 Chronic diastolic (congestive) heart failure: Secondary | ICD-10-CM | POA: Diagnosis not present

## 2016-09-30 DIAGNOSIS — E871 Hypo-osmolality and hyponatremia: Secondary | ICD-10-CM | POA: Diagnosis not present

## 2016-09-30 DIAGNOSIS — E038 Other specified hypothyroidism: Secondary | ICD-10-CM | POA: Diagnosis not present

## 2016-10-04 ENCOUNTER — Ambulatory Visit: Payer: Medicare Other | Admitting: Physical Therapy

## 2016-10-05 ENCOUNTER — Encounter: Payer: Self-pay | Admitting: Physical Therapy

## 2016-10-05 ENCOUNTER — Ambulatory Visit: Payer: Medicare Other | Admitting: Physical Therapy

## 2016-10-05 DIAGNOSIS — R2681 Unsteadiness on feet: Secondary | ICD-10-CM

## 2016-10-05 DIAGNOSIS — M6281 Muscle weakness (generalized): Secondary | ICD-10-CM

## 2016-10-05 DIAGNOSIS — R262 Difficulty in walking, not elsewhere classified: Secondary | ICD-10-CM | POA: Diagnosis not present

## 2016-10-05 NOTE — Therapy (Signed)
[facility] [address] [place], [place], [address] Phone: [phone]   Fax:  [phone]  Physical Therapy Treatment  Patient Details  Name: Lisa Mcclure MRN: [id number] Date of Birth: 11-Jul-1932 Referring Provider: [name]  Encounter Date: 10/05/2016      PT End of Session - 10/05/16 1449    Visit Number 6   Number of Visits 17   Date for PT Re-Evaluation 10/28/16   Authorization Type Medicare; G-Code   PT Start Time 9326   PT Stop Time 7124   PT Time Calculation (min) 43 min   Activity Tolerance Patient limited by pain;Other (comment)  SOB   Behavior During Therapy WFL for tasks assessed/performed      Past Medical History:  Diagnosis Date  . Anemia    h/o hemorrhoidal bleeding and blood transfusion  . Cardiomegaly   . Chronic diastolic CHF (congestive heart failure) ([place])   . Depression   . Diverticulosis   . DOE (dyspnea on exertion)   . Esophageal reflux   . GERD (gastroesophageal reflux disease)   . Hypothyroidism   . Insomnia   . LBP (low back pain)   . OAB (overactive bladder)   . Obesity   . OSA (obstructive sleep apnea)   . Osteoarthritis   . Rheumatoid arthritis(714.0)   . Shoulder pain, bilateral   . Unspecified essential hypertension   . Venous insufficiency     Past Surgical History:  Procedure Laterality Date  . APPENDECTOMY  1953  . bladder abduction-1996  1996  . breast biopsy Right 1980  . [place]  . COSMETIC SURGERY  1996  . CYSTOCELE REPAIR    . FLEXIBLE SIGMOIDOSCOPY N/A 04/10/2015   Procedure: FLEXIBLE SIGMOIDOSCOPY;  Surgeon: Arta Silence, MD;  Location: [facility] ENDOSCOPY;  Service: Endoscopy;  Laterality: N/A;  . knee arthroscopy Right 1996, 2010  . KNEE ARTHROSCOPY W/ AUTOGENOUS CARTILAGE IMPLANTATION (ACI) PROCEDURE Left 1994, 1995  . SHOULDER SURGERY  1990  . TOTAL ABDOMINAL HYSTERECTOMY  1972  . VESICOVAGINAL FISTULA  CLOSURE W/ TAH    . WRIST SURGERY  1967    There were no vitals filed for this visit.      Subjective Assessment - 10/05/16 1407    Subjective Pt out of breath due to running around; pt attempting to adopt a dog.     Patient Stated Goals Be able to walk up and down a few steps; be able to go downtown to the events/galleries (uneven pavement)   Currently in Pain? Yes  chronic shoulder pain                         [facility] Adult PT Treatment/Exercise - 10/05/16 1410      Knee/Hip Exercises: Standing   Hip Abduction Stengthening;Both;1 set;10 reps;Knee straight   Abduction Limitations standing on compliant red mat   Hip Extension Stengthening;Both;1 set;10 reps;Knee straight   Extension Limitations standing on compliant red mat   SLS 1 set R and LLE x 10 seconds each with light UE support standing on compliant red mat     Knee/Hip Exercises: Seated   Sit to Sand without UE support;Other (comment)  12 reps from mat             Balance Exercises - 10/05/16 1420      Balance Exercises: Standing   Other Standing Exercises marching in place on red mat with random cues  for side stepping, retro or forwards stepping + 90 deg turns to L and R randomly to improve patient's balance with sudden changes in direction.  Changed to forwards and retro walking across red mat x 10 reps with R and L 180 deg turns; finished with lateral stepping to L and R x 4 reps each direction with R and L turns across red mat           PT Education - 10/05/16 1449    Education provided Yes   Education Details added sit <> stand without UE support to OTAGO HEP   Person(s) Educated Patient   Methods Explanation;Demonstration   Comprehension Verbalized understanding;Returned demonstration          PT Short Term Goals - 09/28/16 1512      PT SHORT TERM GOAL #1   Title Patient will be independent with initial HEP (Target for all STGs 09/30/16)   Time 4   Period Weeks   Status  Achieved     PT SHORT TERM GOAL #2   Title Patient will improve DGI score to >=19 to indicate a lesser fall risk.    Baseline 20/24   Time 4   Period Weeks   Status Achieved     PT SHORT TERM GOAL #3   Title Patient will improve her TUG score to <14.0 seconds to indicate a lesser fall risk.    Baseline 10.77 seconds   Time 4   Period Weeks   Status Achieved     PT SHORT TERM GOAL #4   Title Patient will ambulate 360 ft with LRAD modified independent on level, indoor surface including negotiating obstacles and thresholds.    Time 4   Period Weeks   Status Achieved           PT Long Term Goals - 09/28/16 1513      PT LONG TERM GOAL #1   Title Patient will be independent with updated HEP (Target for all LTGs 11/01/16)   Time 8   Period Weeks   Status New     PT LONG TERM GOAL #2   Title Patient will improve DGI score to >=21 to indicate a lesser fall risk.   Time 8   Period Weeks   Status New     PT LONG TERM GOAL #3   Title Patient will improve her TUG score to <13.5 seconds to indicate a lesser fall risk.    Baseline 10.77 on 09/28/16   Time 8   Period Weeks   Status Achieved     PT LONG TERM GOAL #4   Title Patient will ambulate 500 ft with LRAD modified independent on unlevel, outdoor paved surfaces including negotiating single step/curb.    Time 8   Period Weeks   Status New               Plan - 10/05/16 1451    Clinical Impression Statement Treatment session focused on dynamic gait and balance during random changes in direction and gait in various directions over mildly compliant foam.  Also performed OTAGO hip strengthening exercises standing on foam to further challenge balance.  Pt required seated rest breaks due to SOB.  Pt requesting to work more on endurance.  Will continue to address and progress towards LTG.   Rehab Potential Good   Clinical Impairments Affecting Rehab Potential arthritis pain; fear of falling   PT Frequency 2x / week   PT  Duration 8 weeks   PT  Treatment/Interventions ADLs/Self Care Home Management;Functional mobility training;Stair training;Gait training;DME Instruction;Therapeutic activities;Therapeutic exercise;Balance training;Neuromuscular re-education;Cognitive remediation;Patient/family education;Orthotic Fit/Training;Passive range of motion;Energy conservation   PT Next Visit Plan open chain LE strengthening-closed chain painful on knees; endurance training.  Stair and curb training, gait outside on pavement.  Dynamic balance/changes in direction.   Consulted and Agree with Plan of Care Patient      Patient will benefit from skilled therapeutic intervention in order to improve the following deficits and impairments:  Abnormal gait, Decreased balance, Decreased knowledge of use of DME, Decreased mobility, Decreased strength, Difficulty walking, Impaired UE functional use, Postural dysfunction, Pain  Visit Diagnosis: Muscle weakness (generalized)  Unsteadiness on feet  Difficulty in walking, not elsewhere classified     Problem List Patient Active Problem List   Diagnosis Date Noted  . Aortic stenosis, mild 01/15/2016  . Bilateral carotid bruits 05/11/2015  . Hyponatremia 04/19/2015  . Chronic anemia 04/19/2015  . Panic attack 04/19/2015  . Constipation 04/19/2015  . Hyperkalemia 04/17/2015  . Influenza A 04/16/2015  . Proximal humerus fracture   . Left patella fracture   . Benign essential HTN   . Esophageal reflux   . Chronic diastolic heart failure ([place])   . Thyroid activity decreased   . Lower GI bleed 04/08/2015  . Hematochezia   . Rectal bleeding 04/03/2015  . Patellar fracture 03/06/2015  . Left humeral fracture 11/04/2013  . Rib fracture 11/04/2013  . Hypothyroid 11/04/2013  . Hypertension 11/04/2013  . OSA (obstructive sleep apnea) 11/13/2008  . BOOP (bronchiolitis obliterans with organizing pneumonia) ([place]) 08/25/2008    Raylene Everts, PT, DPT 10/05/16    9:01  PM    [facility] [address] [place], [place], [address] Phone: [phone]   Fax:  [phone]  Name: Lisa Mcclure MRN: [id number] Date of Birth: [birth year]

## 2016-10-07 ENCOUNTER — Ambulatory Visit: Payer: Medicare Other | Admitting: Physical Therapy

## 2016-10-11 ENCOUNTER — Ambulatory Visit: Payer: Medicare Other | Attending: Internal Medicine | Admitting: Physical Therapy

## 2016-10-11 ENCOUNTER — Encounter: Payer: Self-pay | Admitting: Physical Therapy

## 2016-10-11 DIAGNOSIS — R2681 Unsteadiness on feet: Secondary | ICD-10-CM | POA: Insufficient documentation

## 2016-10-11 DIAGNOSIS — M25611 Stiffness of right shoulder, not elsewhere classified: Secondary | ICD-10-CM | POA: Diagnosis not present

## 2016-10-11 DIAGNOSIS — R262 Difficulty in walking, not elsewhere classified: Secondary | ICD-10-CM | POA: Diagnosis not present

## 2016-10-11 DIAGNOSIS — R278 Other lack of coordination: Secondary | ICD-10-CM | POA: Insufficient documentation

## 2016-10-11 DIAGNOSIS — M25511 Pain in right shoulder: Secondary | ICD-10-CM | POA: Diagnosis not present

## 2016-10-11 DIAGNOSIS — M6281 Muscle weakness (generalized): Secondary | ICD-10-CM | POA: Diagnosis not present

## 2016-10-11 DIAGNOSIS — G8929 Other chronic pain: Secondary | ICD-10-CM | POA: Diagnosis not present

## 2016-10-11 DIAGNOSIS — M25612 Stiffness of left shoulder, not elsewhere classified: Secondary | ICD-10-CM | POA: Insufficient documentation

## 2016-10-11 NOTE — Therapy (Signed)
[facility] [address] [place], [place], [address] Phone: [phone]   Fax:  [phone]  Physical Therapy Treatment  Patient Details  Name: Lisa Mcclure MRN: [id number] Date of Birth: June 06, 1932 Referring Provider: [name]  Encounter Date: 10/11/2016      PT End of Session - 10/11/16 1324    Visit Number 7   Number of Visits 17   Date for PT Re-Evaluation 11/01/16   Authorization Type Medicare; G-Code   PT Start Time 7096   PT Stop Time 1405   PT Time Calculation (min) 44 min   Activity Tolerance Patient tolerated treatment well  SOB   Behavior During Therapy [facility] for tasks assessed/performed      Past Medical History:  Diagnosis Date  . Anemia    h/o hemorrhoidal bleeding and blood transfusion  . Cardiomegaly   . Chronic diastolic CHF (congestive heart failure) ([place])   . Depression   . Diverticulosis   . DOE (dyspnea on exertion)   . Esophageal reflux   . GERD (gastroesophageal reflux disease)   . Hypothyroidism   . Insomnia   . LBP (low back pain)   . OAB (overactive bladder)   . Obesity   . OSA (obstructive sleep apnea)   . Osteoarthritis   . Rheumatoid arthritis(714.0)   . Shoulder pain, bilateral   . Unspecified essential hypertension   . Venous insufficiency     Past Surgical History:  Procedure Laterality Date  . APPENDECTOMY  1953  . bladder abduction-1996  1996  . breast biopsy Right 1980  . [place]  . COSMETIC SURGERY  1996  . CYSTOCELE REPAIR    . FLEXIBLE SIGMOIDOSCOPY N/A 04/10/2015   Procedure: FLEXIBLE SIGMOIDOSCOPY;  Surgeon: Arta Silence, MD;  Location: [facility] ENDOSCOPY;  Service: Endoscopy;  Laterality: N/A;  . knee arthroscopy Right 1996, 2010  . KNEE ARTHROSCOPY W/ AUTOGENOUS CARTILAGE IMPLANTATION (ACI) PROCEDURE Left 1994, 1995  . SHOULDER SURGERY  1990  . TOTAL ABDOMINAL HYSTERECTOMY  1972  . VESICOVAGINAL FISTULA CLOSURE W/  TAH    . WRIST SURGERY  1967    There were no vitals filed for this visit.      Subjective Assessment - 10/11/16 1322    Subjective Pt reporting not doing well today; reports shoulders are hurting a lot and has not been able to sleep well because of it. Has tried ice, heat, medication with no relief.   Patient Stated Goals Be able to walk up and down a few steps; be able to go downtown to the events/galleries (uneven pavement)   Currently in Pain? Yes   Pain Score 10-Worst pain ever   Pain Location Shoulder   Pain Orientation Upper;Posterior   Pain Descriptors / Indicators Discomfort   Pain Type Acute pain                         [facility] Adult PT Treatment/Exercise - 10/11/16 1351      Knee/Hip Exercises: Aerobic   Nustep L4 x 8 minutes with bilat LE-no UE due to shoulder pain             Balance Exercises - 10/11/16 1352      Balance Exercises: Standing   SLS Eyes open;Foam/compliant surface;Upper extremity support 2;3 reps;10 secs  without and with head turns/nods x 10   Rockerboard Anterior/posterior;EO  weight shifting with feet staggered, R/L foot forwards   Balance  Beam Standing forwards on beam while contralateral LE performs forwards and backwards step overs x 10 reps each LE, 2 sets decreasing UE dependence   Sit to Stand Time from elevated stool to decrease tension in knees x 8 reps x 2 sets without use of UE but sliding hands forwards across table to facilitate forwards weight shift over feet             PT Short Term Goals - 09/28/16 1512      PT SHORT TERM GOAL #1   Title Patient will be independent with initial HEP (Target for all STGs 09/30/16)   Time 4   Period Weeks   Status Achieved     PT SHORT TERM GOAL #2   Title Patient will improve DGI score to >=19 to indicate a lesser fall risk.    Baseline 20/24   Time 4   Period Weeks   Status Achieved     PT SHORT TERM GOAL #3   Title Patient will improve her TUG score to <14.0  seconds to indicate a lesser fall risk.    Baseline 10.77 seconds   Time 4   Period Weeks   Status Achieved     PT SHORT TERM GOAL #4   Title Patient will ambulate 360 ft with LRAD modified independent on level, indoor surface including negotiating obstacles and thresholds.    Time 4   Period Weeks   Status Achieved           PT Long Term Goals - 09/28/16 1513      PT LONG TERM GOAL #1   Title Patient will be independent with updated HEP (Target for all LTGs 11/01/16)   Time 8   Period Weeks   Status New     PT LONG TERM GOAL #2   Title Patient will improve DGI score to >=21 to indicate a lesser fall risk.   Time 8   Period Weeks   Status New     PT LONG TERM GOAL #3   Title Patient will improve her TUG score to <13.5 seconds to indicate a lesser fall risk.    Baseline 10.77 on 09/28/16   Time 8   Period Weeks   Status Achieved     PT LONG TERM GOAL #4   Title Patient will ambulate 500 ft with LRAD modified independent on unlevel, outdoor paved surfaces including negotiating single step/curb.    Time 8   Period Weeks   Status New               Plan - 10/11/16 1644    Clinical Impression Statement Continued focus on bilat LE strengthening and endurance on Nustep for gravity minimized position to decrease strain on knees (without use of UE), balance training for improved stability during single limb support to increase step and stride length and foot clearance, and weight shifting for functional transfers.  Pt tolerated despite increased shoulder pain today; no increase in knee pain.  Will continue to progress.   Rehab Potential Good   Clinical Impairments Affecting Rehab Potential arthritis pain; fear of falling   PT Frequency 2x / week   PT Duration 8 weeks   PT Treatment/Interventions ADLs/Self Care Home Management;Functional mobility training;Stair training;Gait training;DME Instruction;Therapeutic activities;Therapeutic exercise;Balance  training;Neuromuscular re-education;Cognitive remediation;Patient/family education;Orthotic Fit/Training;Passive range of motion;Energy conservation   PT Next Visit Plan open chain LE strengthening-closed chain painful on knees; endurance training.  Stair and curb training, gait outside on pavement.  Dynamic balance/changes  in direction.   Consulted and Agree with Plan of Care Patient      Patient will benefit from skilled therapeutic intervention in order to improve the following deficits and impairments:  Abnormal gait, Decreased balance, Decreased knowledge of use of DME, Decreased mobility, Decreased strength, Difficulty walking, Impaired UE functional use, Postural dysfunction, Pain  Visit Diagnosis: Muscle weakness (generalized)  Unsteadiness on feet  Difficulty in walking, not elsewhere classified     Problem List Patient Active Problem List   Diagnosis Date Noted  . Aortic stenosis, mild 01/15/2016  . Bilateral carotid bruits 05/11/2015  . Hyponatremia 04/19/2015  . Chronic anemia 04/19/2015  . Panic attack 04/19/2015  . Constipation 04/19/2015  . Hyperkalemia 04/17/2015  . Influenza A 04/16/2015  . Proximal humerus fracture   . Left patella fracture   . Benign essential HTN   . Esophageal reflux   . Chronic diastolic heart failure ([place])   . Thyroid activity decreased   . Lower GI bleed 04/08/2015  . Hematochezia   . Rectal bleeding 04/03/2015  . Patellar fracture 03/06/2015  . Left humeral fracture 11/04/2013  . Rib fracture 11/04/2013  . Hypothyroid 11/04/2013  . Hypertension 11/04/2013  . OSA (obstructive sleep apnea) 11/13/2008  . BOOP (bronchiolitis obliterans with organizing pneumonia) ([place]) 08/25/2008    Raylene Everts, PT, DPT 10/11/16    4:47 PM    [facility] [address] [place], [place], [address] Phone: [phone]   Fax:  [phone]  Name: Kamrin Spath MRN: [id number] Date of  Birth: [birth year]

## 2016-10-14 ENCOUNTER — Ambulatory Visit: Payer: Medicare Other | Admitting: Physical Therapy

## 2016-10-14 ENCOUNTER — Encounter: Payer: Self-pay | Admitting: Physical Therapy

## 2016-10-14 DIAGNOSIS — M6281 Muscle weakness (generalized): Secondary | ICD-10-CM

## 2016-10-14 DIAGNOSIS — R262 Difficulty in walking, not elsewhere classified: Secondary | ICD-10-CM | POA: Diagnosis not present

## 2016-10-14 DIAGNOSIS — M25611 Stiffness of right shoulder, not elsewhere classified: Secondary | ICD-10-CM | POA: Diagnosis not present

## 2016-10-14 DIAGNOSIS — R278 Other lack of coordination: Secondary | ICD-10-CM

## 2016-10-14 DIAGNOSIS — M25612 Stiffness of left shoulder, not elsewhere classified: Secondary | ICD-10-CM

## 2016-10-14 DIAGNOSIS — R2681 Unsteadiness on feet: Secondary | ICD-10-CM

## 2016-10-14 DIAGNOSIS — M25511 Pain in right shoulder: Secondary | ICD-10-CM

## 2016-10-14 DIAGNOSIS — G8929 Other chronic pain: Secondary | ICD-10-CM

## 2016-10-14 NOTE — Patient Instructions (Addendum)
Visuo-Vestibular: Head / Eyes Moving in Opposite Direction    Holding a target, keep eyes on target and slowly move target side to side while moving head in OPPOSITE direction of target for ____ seconds. Perform sitting. Repeat ____ times per session. Do ____ sessions per day. Repeat using target on pattern background.  Copyright  VHI. All rights reserved.  Gaze Stabilization: Standing Feet Apart (Compliant Surface)    Feet apart on pillow, keeping eyes on target on wall ____ feet away, tilt head down 15-30 and move head side to side for ____ seconds. Repeat while moving head up and down for ____ seconds. Do ____ sessions per day. Repeat using target on pattern background.  Copyright  VHI. All rights reserved.  Gaze Stabilization: Walking Toward Target    Keeping eyes on target, walk toward target on wall ____ feet away, moving head up and down for ____ seconds. Repeat with head tilted down 15-30, moving head side to side for ____ seconds. Do ____ sessions per day. Repeat using target on pattern background.  Copyright  VHI. All rights reserved.  Movements: Head / Eyes (Pictorial Reference)    Tilt head down 15-30. Eyes fixed on target, head moves opposite direction of moving target: ________________. Eyes fixed on target, head moves same direction as moving target. Therapist: Use this card with Eye Exercises 14 and 15.  Copyright  VHI. All rights reserved.

## 2016-10-14 NOTE — Therapy (Signed)
[facility] [address] [place], [place], [address] Phone: [phone]   Fax:  [phone]  Physical Therapy Treatment  Patient Details  Name: Lisa Mcclure MRN: [id number] Date of Birth: 04-Sep-1932 Referring Provider: [name]  Encounter Date: 10/14/2016      PT End of Session - 10/14/16 1500    Visit Number 8   Number of Visits 17   PT Start Time 5631   PT Stop Time 1455   PT Time Calculation (min) 50 min   Activity Tolerance Patient tolerated treatment well      Past Medical History:  Diagnosis Date  . Anemia    h/o hemorrhoidal bleeding and blood transfusion  . Cardiomegaly   . Chronic diastolic CHF (congestive heart failure) ([place])   . Depression   . Diverticulosis   . DOE (dyspnea on exertion)   . Esophageal reflux   . GERD (gastroesophageal reflux disease)   . Hypothyroidism   . Insomnia   . LBP (low back pain)   . OAB (overactive bladder)   . Obesity   . OSA (obstructive sleep apnea)   . Osteoarthritis   . Rheumatoid arthritis(714.0)   . Shoulder pain, bilateral   . Unspecified essential hypertension   . Venous insufficiency     Past Surgical History:  Procedure Laterality Date  . APPENDECTOMY  1953  . bladder abduction-1996  1996  . breast biopsy Right 1980  . [place]  . COSMETIC SURGERY  1996  . CYSTOCELE REPAIR    . FLEXIBLE SIGMOIDOSCOPY N/A 04/10/2015   Procedure: FLEXIBLE SIGMOIDOSCOPY;  Surgeon: Arta Silence, MD;  Location: [facility] ENDOSCOPY;  Service: Endoscopy;  Laterality: N/A;  . knee arthroscopy Right 1996, 2010  . KNEE ARTHROSCOPY W/ AUTOGENOUS CARTILAGE IMPLANTATION (ACI) PROCEDURE Left 1994, 1995  . SHOULDER SURGERY  1990  . TOTAL ABDOMINAL HYSTERECTOMY  1972  . VESICOVAGINAL FISTULA CLOSURE W/ TAH    . WRIST SURGERY  1967    There were no vitals filed for this visit.      Subjective Assessment - 10/14/16 1455    Currently in  Pain? Yes   Pain Score 8    Pain Location Shoulder   Pain Orientation Other (Comment);Upper   Pain Descriptors / Indicators Constant;Sore   Pain Type Chronic pain   Pain Onset More than a month ago   Pain Frequency Constant       Neuromuscular rehab MODCTSIB;  30, 3, 30, 0 Amb 250'  Head  Eye  Body disassociation while walking; looking at objects side to side;  Standing on foam cushion eyes open/closed head movts as able;  Gaze stability ex's-see HEP                          PT Education - 10/14/16 1454    Education provided Yes   Education Details roll of inner ear in contorlling balance; set up with gaze stab ex's and discussed how to progress from chair-stand-feet togethe- tandem- single leg- compliant surface   Person(s) Educated Patient   Methods Demonstration;Handout   Comprehension Verbalized understanding          PT Short Term Goals - 09/28/16 1512      PT SHORT TERM GOAL #1   Title Patient will be independent with initial HEP (Target for all STGs 09/30/16)   Time 4   Period Weeks   Status Achieved  PT SHORT TERM GOAL #2   Title Patient will improve DGI score to >=19 to indicate a lesser fall risk.    Baseline 20/24   Time 4   Period Weeks   Status Achieved     PT SHORT TERM GOAL #3   Title Patient will improve her TUG score to <14.0 seconds to indicate a lesser fall risk.    Baseline 10.77 seconds   Time 4   Period Weeks   Status Achieved     PT SHORT TERM GOAL #4   Title Patient will ambulate 360 ft with LRAD modified independent on level, indoor surface including negotiating obstacles and thresholds.    Time 4   Period Weeks   Status Achieved           PT Long Term Goals - 09/28/16 1513      PT LONG TERM GOAL #1   Title Patient will be independent with updated HEP (Target for all LTGs 11/01/16)   Time 8   Period Weeks   Status New     PT LONG TERM GOAL #2   Title Patient will improve DGI score to >=21 to  indicate a lesser fall risk.   Time 8   Period Weeks   Status New     PT LONG TERM GOAL #3   Title Patient will improve her TUG score to <13.5 seconds to indicate a lesser fall risk.    Baseline 10.77 on 09/28/16   Time 8   Period Weeks   Status Achieved     PT LONG TERM GOAL #4   Title Patient will ambulate 500 ft with LRAD modified independent on unlevel, outdoor paved surfaces including negotiating single step/curb.    Time 8   Period Weeks   Status New               Plan - 10/14/16 1500    Clinical Impression Statement Pt has OTAGO set up for HEP; she has been working on it; as she explained her long hx of being 'clumsy' we looked at [place] and she has poor vestibular function and is highly dependent on vision for her balance;  she had significant difficulty with head eye body disasociation so I gave her some eye ex's that we can progress;  she tolerted the exs well;   Rehab Potential Good   Clinical Impairments Affecting Rehab Potential arthritis pain; fear of falling   PT Treatment/Interventions ADLs/Self Care Home Management;Functional mobility training;Stair training;Gait training;DME Instruction;Therapeutic activities;Therapeutic exercise;Balance training;Neuromuscular re-education;Cognitive remediation;Patient/family education;Orthotic Fit/Training;Passive range of motion;Energy conservation   PT Next Visit Plan review the ex's we sent home today; proceed with oringinal POC      Patient will benefit from skilled therapeutic intervention in order to improve the following deficits and impairments:  Abnormal gait, Difficulty walking, Decreased coordination, Decreased balance, Decreased activity tolerance  Visit Diagnosis: Muscle weakness (generalized)  Stiffness of left shoulder, not elsewhere classified  Unsteadiness on feet  Difficulty in walking, not elsewhere classified  Other lack of coordination  Stiffness of right shoulder, not elsewhere  classified  Chronic right shoulder pain     Problem List Patient Active Problem List   Diagnosis Date Noted  . Aortic stenosis, mild 01/15/2016  . Bilateral carotid bruits 05/11/2015  . Hyponatremia 04/19/2015  . Chronic anemia 04/19/2015  . Panic attack 04/19/2015  . Constipation 04/19/2015  . Hyperkalemia 04/17/2015  . Influenza A 04/16/2015  . Proximal humerus fracture   . Left patella fracture   .  Benign essential HTN   . Esophageal reflux   . Chronic diastolic heart failure ([place])   . Thyroid activity decreased   . Lower GI bleed 04/08/2015  . Hematochezia   . Rectal bleeding 04/03/2015  . Patellar fracture 03/06/2015  . Left humeral fracture 11/04/2013  . Rib fracture 11/04/2013  . Hypothyroid 11/04/2013  . Hypertension 11/04/2013  . OSA (obstructive sleep apnea) 11/13/2008  . BOOP (bronchiolitis obliterans with organizing pneumonia) ([place]) 08/25/2008    Rosaura Carpenter D PT DPT  10/14/2016, 3:08 PM  [facility] [address] [place], [place], [address] Phone: [phone]   Fax:  [phone]  Name: Lisa Mcclure MRN: [id number] Date of Birth: 1932/10/30

## 2016-10-18 ENCOUNTER — Ambulatory Visit: Payer: Medicare Other | Admitting: Physical Therapy

## 2016-10-18 ENCOUNTER — Encounter: Payer: Self-pay | Admitting: Physical Therapy

## 2016-10-18 DIAGNOSIS — M25611 Stiffness of right shoulder, not elsewhere classified: Secondary | ICD-10-CM | POA: Diagnosis not present

## 2016-10-18 DIAGNOSIS — R262 Difficulty in walking, not elsewhere classified: Secondary | ICD-10-CM | POA: Diagnosis not present

## 2016-10-18 DIAGNOSIS — M25612 Stiffness of left shoulder, not elsewhere classified: Secondary | ICD-10-CM | POA: Diagnosis not present

## 2016-10-18 DIAGNOSIS — R278 Other lack of coordination: Secondary | ICD-10-CM | POA: Diagnosis not present

## 2016-10-18 DIAGNOSIS — R2681 Unsteadiness on feet: Secondary | ICD-10-CM | POA: Diagnosis not present

## 2016-10-18 DIAGNOSIS — M6281 Muscle weakness (generalized): Secondary | ICD-10-CM | POA: Diagnosis not present

## 2016-10-18 NOTE — Therapy (Signed)
[facility] [address] [place], [place], [address] Phone: [phone]   Fax:  [phone]  Physical Therapy Treatment  Patient Details  Name: Lisa Mcclure MRN: [id number] Date of Birth: Aug 27, 1932 Referring Provider: [name]  Encounter Date: 10/18/2016      PT End of Session - 10/18/16 1637    Visit Number 9   Number of Visits 17   Date for PT Re-Evaluation 11/01/16   Authorization Type Medicare; G-Code   PT Start Time 1536   PT Stop Time 1615   PT Time Calculation (min) 39 min   Equipment Utilized During Treatment Gait belt   Activity Tolerance Patient tolerated treatment well  SOB   Behavior During Therapy [facility] for tasks assessed/performed      Past Medical History:  Diagnosis Date  . Anemia    h/o hemorrhoidal bleeding and blood transfusion  . Cardiomegaly   . Chronic diastolic CHF (congestive heart failure) ([place])   . Depression   . Diverticulosis   . DOE (dyspnea on exertion)   . Esophageal reflux   . GERD (gastroesophageal reflux disease)   . Hypothyroidism   . Insomnia   . LBP (low back pain)   . OAB (overactive bladder)   . Obesity   . OSA (obstructive sleep apnea)   . Osteoarthritis   . Rheumatoid arthritis(714.0)   . Shoulder pain, bilateral   . Unspecified essential hypertension   . Venous insufficiency     Past Surgical History:  Procedure Laterality Date  . APPENDECTOMY  1953  . bladder abduction-1996  1996  . breast biopsy Right 1980  . [place]  . COSMETIC SURGERY  1996  . CYSTOCELE REPAIR    . FLEXIBLE SIGMOIDOSCOPY N/A 04/10/2015   Procedure: FLEXIBLE SIGMOIDOSCOPY;  Surgeon: Arta Silence, MD;  Location: [facility] ENDOSCOPY;  Service: Endoscopy;  Laterality: N/A;  . knee arthroscopy Right 1996, 2010  . KNEE ARTHROSCOPY W/ AUTOGENOUS CARTILAGE IMPLANTATION (ACI) PROCEDURE Left 1994, 1995  . SHOULDER SURGERY  1990  . TOTAL ABDOMINAL  HYSTERECTOMY  1972  . VESICOVAGINAL FISTULA CLOSURE W/ TAH    . WRIST SURGERY  1967    There were no vitals filed for this visit.      Subjective Assessment - 10/18/16 1537    Subjective States she thinks PT from prior session "is really on to something" about my eyes and my vision. She reports his description 100% matched what she feels when she feels off-balance. States she had company in town this past weekend and admits she did not do any of her exercises.    Patient Stated Goals Be able to walk up and down a few steps; be able to go downtown to the events/galleries (uneven pavement)   Currently in Pain? --  none reported                Vestibular Assessment - 10/18/16 0001      Vestibular Assessment   General Observation wide base of support while walking with cane     Symptom Behavior   Type of Dizziness Imbalance   Frequency of Dizziness daily    Duration of Dizziness seconds   Aggravating Factors Activity in general   Relieving Factors Head stationary     Occulomotor Exam   Occulomotor Alignment Normal   Spontaneous Absent     Vestibulo-Occular Reflex   VOR 1 Head Only (x 1 viewing) x 30 sec with target 3  ft away; +target becomes blurry (even with pt moving head too slowly)   VOR to Slow Head Movement Normal     Visual Acuity   Static 8   Dynamic 5                  Vestibular Treatment/Exercise - 10/18/16 0001      Vestibular Treatment/Exercise   Vestibular Treatment Provided Gaze   Gaze Exercises X1 Viewing Horizontal;X1 Viewing Vertical     X1 Viewing Horizontal   Foot Position seated, standing feet >shoulder width   Time --  60 sec seated; 30 sec standing with target blurring/imbalanc   Reps 3     X1 Viewing Vertical   Foot Position seated, standing   Time --  60 sec, 30 sec with no symptoms   Reps 2               PT Education - 10/18/16 1635    Education provided Yes   Education Details role of vestibular rehab and  need for consistency in doing exercises for neuro changes to occur; risk of life-changing incident if she falls; x 1 VOR exercises   Person(s) Educated Patient   Methods Explanation;Demonstration;Tactile cues;Verbal cues   Comprehension Verbalized understanding;Returned demonstration;Verbal cues required;Need further instruction          PT Short Term Goals - 09/28/16 1512      PT SHORT TERM GOAL #1   Title Patient will be independent with initial HEP (Target for all STGs 09/30/16)   Time 4   Period Weeks   Status Achieved     PT SHORT TERM GOAL #2   Title Patient will improve DGI score to >=19 to indicate a lesser fall risk.    Baseline 20/24   Time 4   Period Weeks   Status Achieved     PT SHORT TERM GOAL #3   Title Patient will improve her TUG score to <14.0 seconds to indicate a lesser fall risk.    Baseline 10.77 seconds   Time 4   Period Weeks   Status Achieved     PT SHORT TERM GOAL #4   Title Patient will ambulate 360 ft with LRAD modified independent on level, indoor surface including negotiating obstacles and thresholds.    Time 4   Period Weeks   Status Achieved           PT Long Term Goals - 09/28/16 1513      PT LONG TERM GOAL #1   Title Patient will be independent with updated HEP (Target for all LTGs 11/01/16)   Time 8   Period Weeks   Status New     PT LONG TERM GOAL #2   Title Patient will improve DGI score to >=21 to indicate a lesser fall risk.   Time 8   Period Weeks   Status New     PT LONG TERM GOAL #3   Title Patient will improve her TUG score to <13.5 seconds to indicate a lesser fall risk.    Baseline 10.77 on 09/28/16   Time 8   Period Weeks   Status Achieved     PT LONG TERM GOAL #4   Title Patient will ambulate 500 ft with LRAD modified independent on unlevel, outdoor paved surfaces including negotiating single step/curb.    Time 8   Period Weeks   Status New               Plan - 10/18/16 1643  Clinical  Impression Statement Session focused on further assessment of pt's vestibular function with equivocal head impulse test (?mild corrective saccade when turning her head to the right, however pt had difficult time allowing PT to move her head despite normal cervical rotation ROM). Patient did have 3 line difference in her static vs dynamic visual acuity which is indicative of peripheral vestibular hypofunction. She does experience blurring of visual target with VOR x 1 and slight imbalance when in standing (denies nausea or double-vision). Extensive education on purpose of vestibular exercises and required frequency for improvement. Will continue to work towards goals to reduce pt's fall risk.    Rehab Potential Good   Clinical Impairments Affecting Rehab Potential arthritis pain; fear of falling   PT Frequency 2x / week   PT Duration 8 weeks   PT Treatment/Interventions ADLs/Self Care Home Management;Functional mobility training;Stair training;Gait training;DME Instruction;Therapeutic activities;Therapeutic exercise;Balance training;Neuromuscular re-education;Cognitive remediation;Patient/family education;Orthotic Fit/Training;Passive range of motion;Energy conservation   PT Next Visit Plan G-code; review x 1 VOR horizontal and vertical (she was asymptomatic with vertical); review other HEP as pt reports unable to find time to do ex's; continue working on balance    Consulted and Agree with Plan of Care Patient      Patient will benefit from skilled therapeutic intervention in order to improve the following deficits and impairments:  Abnormal gait, Difficulty walking, Decreased coordination, Decreased balance, Decreased activity tolerance  Visit Diagnosis: Unsteadiness on feet     Problem List Patient Active Problem List   Diagnosis Date Noted  . Aortic stenosis, mild 01/15/2016  . Bilateral carotid bruits 05/11/2015  . Hyponatremia 04/19/2015  . Chronic anemia 04/19/2015  . Panic attack  04/19/2015  . Constipation 04/19/2015  . Hyperkalemia 04/17/2015  . Influenza A 04/16/2015  . Proximal humerus fracture   . Left patella fracture   . Benign essential HTN   . Esophageal reflux   . Chronic diastolic heart failure ([place])   . Thyroid activity decreased   . Lower GI bleed 04/08/2015  . Hematochezia   . Rectal bleeding 04/03/2015  . Patellar fracture 03/06/2015  . Left humeral fracture 11/04/2013  . Rib fracture 11/04/2013  . Hypothyroid 11/04/2013  . Hypertension 11/04/2013  . OSA (obstructive sleep apnea) 11/13/2008  . BOOP (bronchiolitis obliterans with organizing pneumonia) ([place]) 08/25/2008    Rexanne Mano, PT 10/18/2016, 4:57 PM  [facility] [address] [place], [place], [address] Phone: [phone]   Fax:  [phone]  Name: Lisa Mcclure MRN: [id number] Date of Birth: 1932-12-16

## 2016-10-18 NOTE — Patient Instructions (Signed)
Gaze Stabilization: Tip Card  1.Target must remain in focus, not blurry, and appear stationary while head is in motion. 2.Perform exercises with small head movements (45 to either side of midline). 3.Increase speed of head motion so long as target is in focus. 4.If you wear eyeglasses, be sure you can see target through lens (therapist will give specific instructions for bifocal / progressive lenses). 5.These exercises may provoke dizziness or nausea. Work through these symptoms. If too dizzy, slow head movement slightly. Rest between each exercise. 6.Exercises demand concentration; avoid distractions.  Copyright  VHI. All rights reserved.     Special Instructions: Exercises may bring on mild to moderate symptoms of blurriness that resolve within 30 minutes of completing exercises. If symptoms are lasting longer than 30 minutes, modify your exercises by:  >decreasing the # of times you complete each activity >ensuring your symptoms return to baseline before moving onto the next exercise >dividing up exercises so you do not do them all in one session, but multiple short sessions throughout the day >doing them once a day until symptoms improve    Gaze Stabilization - Tip Card  For safety, perform standing exercises close to a counter, wall, corner, or next to someone.   Gaze Stabilization - Standing Feet Apart   Feet a bit wider than shoulder width apart, keeping eyes on target on wall 3 feet away, tilt head down slightly and move head side to side for 30 seconds. Repeat while moving head up and down for 30 seconds. *Work up to tolerating 60 seconds, as able. Do 2-3 sessions per day.   Copyright  VHI. All rights reserved.

## 2016-10-21 ENCOUNTER — Ambulatory Visit: Payer: Medicare Other | Admitting: Physical Therapy

## 2016-10-25 ENCOUNTER — Ambulatory Visit: Payer: Medicare Other | Admitting: Physical Therapy

## 2016-10-27 ENCOUNTER — Ambulatory Visit: Payer: Medicare Other | Admitting: Physical Therapy

## 2016-10-27 DIAGNOSIS — M6281 Muscle weakness (generalized): Secondary | ICD-10-CM

## 2016-10-27 DIAGNOSIS — M25611 Stiffness of right shoulder, not elsewhere classified: Secondary | ICD-10-CM | POA: Diagnosis not present

## 2016-10-27 DIAGNOSIS — R262 Difficulty in walking, not elsewhere classified: Secondary | ICD-10-CM | POA: Diagnosis not present

## 2016-10-27 DIAGNOSIS — R278 Other lack of coordination: Secondary | ICD-10-CM | POA: Diagnosis not present

## 2016-10-27 DIAGNOSIS — R2681 Unsteadiness on feet: Secondary | ICD-10-CM | POA: Diagnosis not present

## 2016-10-27 DIAGNOSIS — M25612 Stiffness of left shoulder, not elsewhere classified: Secondary | ICD-10-CM | POA: Diagnosis not present

## 2016-10-28 ENCOUNTER — Ambulatory Visit: Payer: Medicare Other | Admitting: Physical Therapy

## 2016-10-28 DIAGNOSIS — H2512 Age-related nuclear cataract, left eye: Secondary | ICD-10-CM | POA: Diagnosis not present

## 2016-10-28 DIAGNOSIS — H5310 Unspecified subjective visual disturbances: Secondary | ICD-10-CM | POA: Diagnosis not present

## 2016-10-28 DIAGNOSIS — H353213 Exudative age-related macular degeneration, right eye, with inactive scar: Secondary | ICD-10-CM | POA: Diagnosis not present

## 2016-10-28 DIAGNOSIS — H353122 Nonexudative age-related macular degeneration, left eye, intermediate dry stage: Secondary | ICD-10-CM | POA: Diagnosis not present

## 2016-10-28 NOTE — Therapy (Signed)
[facility] [address] [place], [place], [address] Phone: [phone]   Fax:  [phone]  Physical Therapy Treatment  Patient Details  Name: Lisa Mcclure MRN: [id number] Date of Birth: 1932/10/26 Referring Provider: [name]  Encounter Date: 10/27/2016      PT End of Session - 10/28/16 1417    Visit Number 10   Number of Visits 17   Date for PT Re-Evaluation 11/01/16   Authorization Type Medicare; G-Code   PT Start Time 0093   PT Stop Time 8182   PT Time Calculation (min) 49 min      Past Medical History:  Diagnosis Date  . Anemia    h/o hemorrhoidal bleeding and blood transfusion  . Cardiomegaly   . Chronic diastolic CHF (congestive heart failure) ([place])   . Depression   . Diverticulosis   . DOE (dyspnea on exertion)   . Esophageal reflux   . GERD (gastroesophageal reflux disease)   . Hypothyroidism   . Insomnia   . LBP (low back pain)   . OAB (overactive bladder)   . Obesity   . OSA (obstructive sleep apnea)   . Osteoarthritis   . Rheumatoid arthritis(714.0)   . Shoulder pain, bilateral   . Unspecified essential hypertension   . Venous insufficiency     Past Surgical History:  Procedure Laterality Date  . APPENDECTOMY  1953  . bladder abduction-1996  1996  . breast biopsy Right 1980  . [place]  . COSMETIC SURGERY  1996  . CYSTOCELE REPAIR    . FLEXIBLE SIGMOIDOSCOPY N/A 04/10/2015   Procedure: FLEXIBLE SIGMOIDOSCOPY;  Surgeon: Arta Silence, MD;  Location[facility] ENDOSCOPY;  Service: Endoscopy;  Laterality: N/A;  . knee arthroscopy Right 1996, 2010  . KNEE ARTHROSCOPY W/ AUTOGENOUS CARTILAGE IMPLANTATION (ACI) PROCEDURE Left 1994, 1995  . SHOULDER SURGERY  1990  . TOTAL ABDOMINAL HYSTERECTOMY  1972  . VESICOVAGINAL FISTULA CLOSURE W/ TAH    . WRIST SURGERY  1967    There were no vitals filed for this visit.      Subjective Assessment -  10/28/16 1401    Subjective Pt reports no changes - states she is doing exercises some but she has never "been one to exercise alot";  pt reports she is doing the letter exercise some but states she has no symptoms or problems doing this exercise   Patient Stated Goals Be able to walk up and down a few steps; be able to go downtown to the events/galleries (uneven pavement)   Currently in Pain? No/denies                         [facility] Adult PT Treatment/Exercise - 10/28/16 0001      Transfers   Transfers Sit to Stand   Sit to Stand 6: Modified independent (Device/Increase time)   Stand to Sit 6: Modified independent (Device/Increase time)   Comments with UE support from mat     Ambulation/Gait   Ambulation/Gait Yes   Ambulation/Gait Assistance 5: Supervision   Ambulation Distance (Feet) 230 Feet   Assistive device None   Gait Pattern Step-through pattern;Decreased step length - right;Decreased step length - left;Right foot flat;Left foot flat;Wide base of support;Decreased trunk rotation   Ambulation Surface Level;Indoor             Balance Exercises - 10/28/16 1415      Balance Exercises: Standing  Standing Eyes Opened Narrow base of support (BOS);Wide (BOA);Head turns;5 reps  horizontal and verticla head turns   Standing Eyes Closed Wide (BOA);10 secs;2 reps;Head turns  slow head turns for incr. ease of performance   Rockerboard Anterior/posterior;EO  weight shifting with feet staggered, R/L foot forwards   Partial Tandem Stance Eyes open;Upper extremity support 1;2 reps;30 secs  each foot position      Marching in place with head turns - UE support at counter - 10 reps each leg Ankle sways x 10 reps with CGA for safety due to posterior LOB  Tap ups to 1st and 2nd step; no rail needed for tap ups to 1st step, but rail needed for tap ups to 2nd step Pt performed amb. Without cane with horizontal head turns 5' with CGA due to unsteady gait with head  turns       PT Short Term Goals - 10/28/16 1422      PT SHORT TERM GOAL #1   Title Patient will be independent with initial HEP (Target for all STGs 09/30/16)   Status Achieved     PT SHORT TERM GOAL #2   Title Patient will improve DGI score to >=19 to indicate a lesser fall risk.    Baseline 20/24   Status Achieved     PT SHORT TERM GOAL #3   Title Patient will improve her TUG score to <14.0 seconds to indicate a lesser fall risk.    Baseline 10.77 seconds   Status Achieved     PT SHORT TERM GOAL #4   Title Patient will ambulate 360 ft with LRAD modified independent on level, indoor surface including negotiating obstacles and thresholds.    Status Achieved           PT Long Term Goals - 09/28/16 1513      PT LONG TERM GOAL #1   Title Patient will be independent with updated HEP (Target for all LTGs 11/01/16)   Time 8   Period Weeks   Status New     PT LONG TERM GOAL #2   Title Patient will improve DGI score to >=21 to indicate a lesser fall risk.   Time 8   Period Weeks   Status New     PT LONG TERM GOAL #3   Title Patient will improve her TUG score to <13.5 seconds to indicate a lesser fall risk.    Baseline 10.77 on 09/28/16   Time 8   Period Weeks   Status Achieved     PT LONG TERM GOAL #4   Title Patient will ambulate 500 ft with LRAD modified independent on unlevel, outdoor paved surfaces including negotiating single step/curb.    Time 8   Period Weeks   Status New               Plan - 10/28/16 1417    Clinical Impression Statement Pt has unsteadiness with standing with EC and with amb. with head turns, indicative of decreased vestibular input in maintaining balance.  Pt also has signficantly decreased SLS on each foot, requiring UE support to maintain standing balance and also pt has significant fear of falling due to h/o injurious falls.  Pt would benefit from regular exercise program to maintain current mobility/functional status.  Rehab Potential Good   Clinical Impairments Affecting Rehab Potential arthritis pain; fear of falling   PT Frequency 2x / week   PT Duration 8 weeks   PT Next Visit Plan Begin checking LTG's next week - D/C?   Consulted and Agree with Plan of Care Patient      Patient will benefit from skilled therapeutic intervention in order to improve the following deficits and impairments:  Abnormal gait, Difficulty walking, Decreased coordination, Decreased balance, Decreased activity tolerance  Visit Diagnosis: Unsteadiness on feet  Muscle weakness (generalized)       G-Codes - 11/17/2016 1423    Functional Assessment Tool Used (Outpatient Only) DGI 20/24:  TUG score 10.77 secs   Functional Limitation Mobility: Walking and moving around   Mobility: Walking and Moving Around Current Status [phone]) At least 20 percent but less than 40 percent impaired, limited or restricted   Mobility: Walking and Moving Around Goal Status [phone]) At least 1 percent but less than 20 percent impaired, limited or restricted      Problem List Patient Active Problem List   Diagnosis Date Noted  . Aortic stenosis, mild 01/15/2016  . Bilateral carotid bruits 05/11/2015  . Hyponatremia 04/19/2015  . Chronic anemia 04/19/2015  . Panic attack 04/19/2015  . Constipation 04/19/2015  . Hyperkalemia 04/17/2015  . Influenza A 04/16/2015  . Proximal humerus fracture   . Left patella fracture   . Benign essential HTN   . Esophageal reflux   . Chronic diastolic heart failure ([place])   . Thyroid activity decreased   . Lower GI bleed 04/08/2015  . Hematochezia   . Rectal bleeding 04/03/2015  . Patellar fracture 03/06/2015  . Left humeral fracture 11/04/2013  . Rib fracture 11/04/2013  . Hypothyroid 11/04/2013  . Hypertension 11/04/2013  . OSA (obstructive sleep  apnea) 11/13/2008  . BOOP (bronchiolitis obliterans with organizing pneumonia) ([place]) 08/25/2008    Hadlee Burback, [place], PT 10/28/2016, 2:26 PM  [facility] [address] [place], [place], [address] Phone: [phone]   Fax:  [phone]  Name: Lisa Mcclure MRN: [id number] Date of Birth: 11/30/1932

## 2016-11-01 ENCOUNTER — Ambulatory Visit: Payer: Medicare Other | Admitting: Physical Therapy

## 2016-11-01 ENCOUNTER — Encounter: Payer: Self-pay | Admitting: Physical Therapy

## 2016-11-01 DIAGNOSIS — R2681 Unsteadiness on feet: Secondary | ICD-10-CM

## 2016-11-01 DIAGNOSIS — R262 Difficulty in walking, not elsewhere classified: Secondary | ICD-10-CM | POA: Diagnosis not present

## 2016-11-01 DIAGNOSIS — M6281 Muscle weakness (generalized): Secondary | ICD-10-CM | POA: Diagnosis not present

## 2016-11-01 DIAGNOSIS — M25611 Stiffness of right shoulder, not elsewhere classified: Secondary | ICD-10-CM | POA: Diagnosis not present

## 2016-11-01 DIAGNOSIS — M25612 Stiffness of left shoulder, not elsewhere classified: Secondary | ICD-10-CM | POA: Diagnosis not present

## 2016-11-01 DIAGNOSIS — R278 Other lack of coordination: Secondary | ICD-10-CM | POA: Diagnosis not present

## 2016-11-01 NOTE — Patient Instructions (Signed)
   Copyright  VHI. All rights reserved.  Feet Apart (Compliant Surface) Head Motion - Eyes Closed    Stand on compliant surface:  with feet shoulder width apart. Close eyes and move head slowly, up and down x 10 and left and right x 10.  Repeat 1 times per session. Do 1 sessions per day.  Copyright  VHI. All rights reserved.  Feet Together (Compliant Surface) Head Motion - Eyes Closed    Stand on compliant surface:  with feet together. Close eyes and move head slowly, up and down x 10 and left and right x 10.  Repeat 1 times per session. Do 1 sessions per day.  Copyright  VHI. All rights reserved.  Feet Partial Heel-Toe (Compliant Surface) Head Motion - Eyes Closed    Stand on compliant surface:  with right foot partially in front of the other. Close eyes and move head slowly, up and down x 10 and left and right x 10.  Repeat 1 times per session. Do 1 sessions per day. Copyright  VHI. All rights reserved.   Single Leg - Eyes Open    Holding support, lift right leg while maintaining balance over other leg. Progress to removing hands from support surface for longer periods of time. Goal is 10 seconds. Repeat __3__ times on each leg. Do ___1_ sessions per day.  Copyright  VHI. All rights reserved.

## 2016-11-01 NOTE — Therapy (Signed)
[facility] [address] [place], [place], [address] Phone: [phone]   Fax:  [phone]  Physical Therapy Treatment  Patient Details  Name: Lisa Mcclure MRN: [id number] Date of Birth: [birth year] Referring Provider: [name]  Encounter Date: 11/01/2016      PT End of Session - 11/01/16 1702    Visit Number 11   Number of Visits 33  recertified 3/14   Date for PT Re-Evaluation 12/31/16   Authorization Type Medicare; G-Code   PT Start Time 9702   PT Stop Time 1620   PT Time Calculation (min) 45 min   Equipment Utilized During Treatment Gait belt   Activity Tolerance Patient tolerated treatment well   Behavior During Therapy [facility] for tasks assessed/performed      Past Medical History:  Diagnosis Date  . Anemia    h/o hemorrhoidal bleeding and blood transfusion  . Cardiomegaly   . Chronic diastolic CHF (congestive heart failure) ([place])   . Depression   . Diverticulosis   . DOE (dyspnea on exertion)   . Esophageal reflux   . GERD (gastroesophageal reflux disease)   . Hypothyroidism   . Insomnia   . LBP (low back pain)   . OAB (overactive bladder)   . Obesity   . OSA (obstructive sleep apnea)   . Osteoarthritis   . Rheumatoid arthritis(714.0)   . Shoulder pain, bilateral   . Unspecified essential hypertension   . Venous insufficiency     Past Surgical History:  Procedure Laterality Date  . APPENDECTOMY  1953  . bladder abduction-1996  1996  . breast biopsy Right 1980  . [place]  . COSMETIC SURGERY  1996  . CYSTOCELE REPAIR    . FLEXIBLE SIGMOIDOSCOPY N/A 04/10/2015   Procedure: FLEXIBLE SIGMOIDOSCOPY;  Surgeon: Arta Silence, MD;  Location: [facility] ENDOSCOPY;  Service: Endoscopy;  Laterality: N/A;  . knee arthroscopy Right 1996, 2010  . KNEE ARTHROSCOPY W/ AUTOGENOUS CARTILAGE IMPLANTATION (ACI) PROCEDURE Left 1994, 1995  . SHOULDER SURGERY  1990  . TOTAL  ABDOMINAL HYSTERECTOMY  1972  . VESICOVAGINAL FISTULA CLOSURE W/ TAH    . WRIST SURGERY  1967    There were no vitals filed for this visit.      Subjective Assessment - 11/01/16 1541    Subjective Had an episode last week when lost vision in her left eye "like mud was spread over my glasses lens" that lasted only seconds. Went to see her eye doctor and not sure what caused except perhaps a TIA. States she has done gaze stabilization exercises 3x/day for past 2 weeks and she has no symptoms with exercise.    Patient Stated Goals Be able to walk up and down a few steps; be able to go downtown to the events/galleries (uneven pavement)   Currently in Pain? No/denies                         [facility] Adult PT Treatment/Exercise - 11/01/16 1628      Ambulation/Gait   Ambulation/Gait Assistance 5: Supervision;4: Min guard   Ambulation/Gait Assistance Details staggered twice with head turns and turning; no assist needed to recover   Ambulation Distance (Feet) 500 Feet   Assistive device Straight cane;None   Gait Pattern Step-through pattern;Decreased step length - right;Decreased step length - left;Right foot flat;Left foot flat;Wide base of support;Decreased trunk rotation   Ambulation Surface Level;Indoor   Ramp 5:  Supervision   Ramp Details (indicate cue type and reason) pt ascended modified independent with cane; hesitant to descend with cane, vc for weight shift to back of foot/heel          Vestibular Treatment/Exercise - 11/01/16 1658      Vestibular Treatment/Exercise   Vestibular Treatment Provided Gaze   Habituation Exercises Comment  pt denied any particular movement that causes sense of imbal     X1 Viewing Horizontal   Foot Position standing on 2 pillows   Time --  30   Reps 1   Comments no symptoms observed; only slight sway     X1 Viewing Vertical   Foot Position standing on 2 pillows   Time --  30 sec   Reps 1   Comments no symptoms observed; only  slight sway            Balance Exercises - 11/01/16 1700      Balance Exercises: Standing   Standing Eyes Opened Wide (BOA);Foam/compliant surface;1 rep;30 secs   Standing Eyes Closed Narrow base of support (BOS);Wide (BOA);Head turns;Foam/compliant surface   SLS Eyes open;Solid surface;Upper extremity support 1;Upper extremity support 2;3 reps;20 secs           PT Education - 11/01/16 1701    Education provided Yes   Education Details additions to [employer]) Educated Patient   Methods Explanation;Demonstration;Verbal cues;Handout   Comprehension Verbalized understanding;Returned demonstration;Need further instruction          PT Short Term Goals - 11/01/16 1731      PT SHORT TERM GOAL #1   Title Patient will be independent with updated HEP    Time 4   Period Weeks   Status New   Target Date 12/01/16     PT SHORT TERM GOAL #2   Title Patient will improve DGI score by 2 points to indicate a lesser fall risk (compared to last goal check).    Baseline --   Time 4   Period Weeks   Status New   Target Date 12/01/16     PT SHORT TERM GOAL #3   Title Patient will be able to maintain SLS > 3 seconds without UE support to demonstrate a lesser fall risk.    Baseline --   Time 4   Period Weeks   Status New   Target Date 12/01/16     PT SHORT TERM GOAL #4   Title Patient will ambulate 500 ft with LRAD modified independent on unlevel, outdoor paved surfaces including negotiating single step/curb.    Time 4   Period Weeks   Status New   Target Date 12/01/16           PT Long Term Goals - 11/01/16 1704      PT LONG TERM GOAL #1   Title Patient will be independent with updated HEP    Baseline 7/24 HEP updated to remove gaze stabilization & added balance activities   Time 8   Period Weeks   Status On-going   Target Date 12/31/16     PT LONG TERM GOAL #2   Title Patient will improve DGI score to >=21 to indicate a lesser fall risk.   Baseline 7/24  not assessed due to time   Time 8   Period Weeks   Status Unable to assess  TBA next visit and goal updated if approp     PT LONG TERM GOAL #3   Title Patient will improve her  TUG score to <13.5 seconds to indicate a lesser fall risk.    Baseline 10.77 on 09/28/16   Time 8   Period Weeks   Status Achieved     PT LONG TERM GOAL #4   Title Patient will ambulate 500 ft with LRAD modified independent on unlevel, outdoor paved surfaces including negotiating single step/curb.    Baseline 7/24 simulated indoors with cane (see note) supervision with cane    Time 8   Period Weeks   Status Not Met     PT LONG TERM GOAL #5   Title Patient will be able to verbalize her plan for continued activity upon discharge from PT.    Time 8   Period Weeks   Status New   Target Date 12/31/16     Additional Long Term Goals   Additional Long Term Goals Yes     PT LONG TERM GOAL #6   Title Patient will be modified independent walking over unlevel ground outdoors (grass, mulch) with cane x 25 ft to simulate walking in her back yard.    Time 8   Period Weeks   Status New   Target Date 12/31/16     PT LONG TERM GOAL #7   Title Patient will be modified independent with cane walking over paved outdoor surfaces x 1000 ft to simulate walking to downtown events.   Time 8   Period Weeks   Status New   Target Date 12/31/16               Plan - 11/01/16 1715    Clinical Impression Statement Session focused on assessing LTGs and updating HEP. Patient met 1 of 4 LTGs: #1 goal for HEP ongoing due to recent updates/changes, #2 goal for DGI not assessed due to lack of time (to assess next visit), #3 TUG goal <13.5 met, #4 not met with pt requiring supervision with cane for simulated outdoor walking including curb. Patient reports she feels her balance has improved some, but wants to continue with revised HEP and more focus on balance training (vestibular rehabilitation exercises have been more the focus).  She is interested in returning to exercise classes at the [facility] when she feels more confident with her balance. Will recertify for an additional 2x/week for 8 weeks with plan to decrease to 1x/week as pt independent with HEP and ready to return to exercise classes.    Rehab Potential Good   Clinical Impairments Affecting Rehab Potential arthritis pain; fear of falling   PT Frequency 2x / week  goal to decr to 1x/week   PT Duration 8 weeks   PT Treatment/Interventions ADLs/Self Care Home Management;Functional mobility training;Stair training;Gait training;DME Instruction;Therapeutic activities;Therapeutic exercise;Balance training;Neuromuscular re-education;Cognitive remediation;Patient/family education;Orthotic Fit/Training;Passive range of motion;Energy conservation   PT Next Visit Plan check DGI and add or update goal as approp (recert done 4/31); check HEP given 7/24 and add to HEP for balance as indicated; work with cane outside (weather permitting) or on single steps "curbs" of varying height indoors   Consulted and Agree with Plan of Care Patient      Patient will benefit from skilled therapeutic intervention in order to improve the following deficits and impairments:  Abnormal gait, Difficulty walking, Decreased coordination, Decreased balance, Decreased activity tolerance  Visit Diagnosis: Unsteadiness on feet - Plan: PT plan of care cert/re-cert  Difficulty in walking, not elsewhere classified - Plan: PT plan of care cert/re-cert     Problem List Patient Active Problem List  Diagnosis Date Noted  . Aortic stenosis, mild 01/15/2016  . Bilateral carotid bruits 05/11/2015  . Hyponatremia 04/19/2015  . Chronic anemia 04/19/2015  . Panic attack 04/19/2015  . Constipation 04/19/2015  . Hyperkalemia 04/17/2015  . Influenza A 04/16/2015  . Proximal humerus fracture   . Left patella fracture   . Benign essential HTN   . Esophageal reflux   . Chronic diastolic heart  failure ([place])   . Thyroid activity decreased   . Lower GI bleed 04/08/2015  . Hematochezia   . Rectal bleeding 04/03/2015  . Patellar fracture 03/06/2015  . Left humeral fracture 11/04/2013  . Rib fracture 11/04/2013  . Hypothyroid 11/04/2013  . Hypertension 11/04/2013  . OSA (obstructive sleep apnea) 11/13/2008  . BOOP (bronchiolitis obliterans with organizing pneumonia) ([place]) 08/25/2008    Rexanne Mano, PT 11/01/2016, 6:09 PM  [facility] [address] [place], [place], [address] Phone: [phone]   Fax:  [phone]  Name: Lisa Mcclure MRN: [id number] Date of Birth: October 09, 1932

## 2016-11-03 DIAGNOSIS — H353131 Nonexudative age-related macular degeneration, bilateral, early dry stage: Secondary | ICD-10-CM | POA: Diagnosis not present

## 2016-11-03 DIAGNOSIS — H43813 Vitreous degeneration, bilateral: Secondary | ICD-10-CM | POA: Diagnosis not present

## 2016-11-04 ENCOUNTER — Ambulatory Visit: Payer: Medicare Other | Admitting: Physical Therapy

## 2016-11-04 DIAGNOSIS — F4321 Adjustment disorder with depressed mood: Secondary | ICD-10-CM | POA: Diagnosis not present

## 2016-11-04 DIAGNOSIS — I10 Essential (primary) hypertension: Secondary | ICD-10-CM | POA: Diagnosis not present

## 2016-11-04 DIAGNOSIS — M25512 Pain in left shoulder: Secondary | ICD-10-CM | POA: Diagnosis not present

## 2016-11-04 DIAGNOSIS — R2689 Other abnormalities of gait and mobility: Secondary | ICD-10-CM | POA: Diagnosis not present

## 2016-11-04 DIAGNOSIS — G4733 Obstructive sleep apnea (adult) (pediatric): Secondary | ICD-10-CM | POA: Diagnosis not present

## 2016-11-04 DIAGNOSIS — I5032 Chronic diastolic (congestive) heart failure: Secondary | ICD-10-CM | POA: Diagnosis not present

## 2016-11-04 DIAGNOSIS — Z6837 Body mass index (BMI) 37.0-37.9, adult: Secondary | ICD-10-CM | POA: Diagnosis not present

## 2016-11-15 ENCOUNTER — Ambulatory Visit: Payer: Medicare Other | Attending: Internal Medicine | Admitting: Physical Therapy

## 2016-11-15 ENCOUNTER — Encounter: Payer: Self-pay | Admitting: Physical Therapy

## 2016-11-15 DIAGNOSIS — M6281 Muscle weakness (generalized): Secondary | ICD-10-CM | POA: Insufficient documentation

## 2016-11-15 DIAGNOSIS — R262 Difficulty in walking, not elsewhere classified: Secondary | ICD-10-CM | POA: Diagnosis not present

## 2016-11-15 DIAGNOSIS — R2681 Unsteadiness on feet: Secondary | ICD-10-CM | POA: Diagnosis not present

## 2016-11-15 NOTE — Therapy (Signed)
[facility] [address] [place], [place], [address] Phone: [phone]   Fax:  [phone]  Physical Therapy Treatment  Patient Details  Name: Lisa Mcclure MRN: [id number] Date of Birth: [birth year] Referring Provider: [name]  Encounter Date: 11/15/2016      PT End of Session - 11/15/16 2221    Visit Number 12  G1   Number of Visits 33  recertified 9/70   Date for PT Re-Evaluation 12/31/16   Authorization Type Medicare; G-Code   PT Start Time 2637   PT Stop Time 1530   PT Time Calculation (min) 43 min   Equipment Utilized During Treatment Gait belt   Activity Tolerance Patient tolerated treatment well   Behavior During Therapy [facility] for tasks assessed/performed      Past Medical History:  Diagnosis Date  . Anemia    h/o hemorrhoidal bleeding and blood transfusion  . Cardiomegaly   . Chronic diastolic CHF (congestive heart failure) ([place])   . Depression   . Diverticulosis   . DOE (dyspnea on exertion)   . Esophageal reflux   . GERD (gastroesophageal reflux disease)   . Hypothyroidism   . Insomnia   . LBP (low back pain)   . OAB (overactive bladder)   . Obesity   . OSA (obstructive sleep apnea)   . Osteoarthritis   . Rheumatoid arthritis(714.0)   . Shoulder pain, bilateral   . Unspecified essential hypertension   . Venous insufficiency     Past Surgical History:  Procedure Laterality Date  . APPENDECTOMY  1953  . bladder abduction-1996  1996  . breast biopsy Right 1980  . [place]  . COSMETIC SURGERY  1996  . CYSTOCELE REPAIR    . FLEXIBLE SIGMOIDOSCOPY N/A 04/10/2015   Procedure: FLEXIBLE SIGMOIDOSCOPY;  Surgeon: Arta Silence, MD;  Location: [facility] ENDOSCOPY;  Service: Endoscopy;  Laterality: N/A;  . knee arthroscopy Right 1996, 2010  . KNEE ARTHROSCOPY W/ AUTOGENOUS CARTILAGE IMPLANTATION (ACI) PROCEDURE Left 1994, 1995  . SHOULDER SURGERY  1990  .  TOTAL ABDOMINAL HYSTERECTOMY  1972  . VESICOVAGINAL FISTULA CLOSURE W/ TAH    . WRIST SURGERY  1967    There were no vitals filed for this visit.      Subjective Assessment - 11/15/16 1449    Subjective I've been to [place] twice since I was here. New to this and enjoying.   Patient Stated Goals Be able to walk up and down a few steps; be able to go downtown to the events/galleries (uneven pavement)   Currently in Pain? Yes   Pain Score 4    Pain Location Shoulder   Pain Orientation Right   Pain Descriptors / Indicators Sore   Pain Type Chronic pain            [facility] PT Assessment - 11/15/16 1457      Dynamic Gait Index   Level Surface Normal   Change in Gait Speed Mild Impairment   Gait with Horizontal Head Turns Normal   Gait with Vertical Head Turns Normal   Gait and Pivot Turn Normal   Step Over Obstacle Normal   Step Around Obstacles Normal   Steps Mild Impairment   Total Score 22                     [facility] Adult PT Treatment/Exercise - 11/15/16 2213      Transfers   Transfers Sit  to Stand;Stand to Sit   Sit to Stand 6: Modified independent (Device/Increase time);With upper extremity assist   Stand to Sit 6: Modified independent (Device/Increase time);With upper extremity assist     Ambulation/Gait   Ambulation/Gait Yes   Ambulation/Gait Assistance 5: Supervision;4: Min guard   Ambulation/Gait Assistance Details imbalance, drifts without cane with head turns   Ambulation Distance (Feet) 120 Feet  400   Assistive device Straight cane;None   Gait Pattern Step-through pattern;Decreased step length - right;Decreased step length - left;Right foot flat;Left foot flat;Wide base of support;Decreased trunk rotation   Ambulation Surface Level;Indoor   Gait Comments including pt tossing ball and catching it while tracking with her eyes; then tracking ball while PT moved it horizontal or vertical (larger excursions than pt did for herself); feels most off  balance with vertical head movements, however noted eyes drifting off ball when doing horizontal movements; pt unaware she lost gaze on ball and stated "that's probably due to my dyslexia"             Balance Exercises - 11/15/16 2223      Balance Exercises: Standing   Other Standing Exercises attempted standing on blue airex foam; pt with noted clonus and unable to shift her weight from the balls of her feet towards her heels to get clonus to stop; attempted various cues, manual faciliation, and pt holding onto // bars with no success; switched to using foam pillows           PT Education - 11/15/16 2219    Education provided Yes   Education Details safe in PT to "push beyond her comfort zone" related to her balance and what activities she will try (became very nervous, hesitant to attempt several tasks); some tasks done in PT gym she should NOT try at home   Person(s) Educated Patient   Methods Explanation;Demonstration;Verbal cues   Comprehension Verbalized understanding;Returned demonstration          PT Short Term Goals - 11/01/16 1731      PT SHORT TERM GOAL #1   Title Patient will be independent with updated HEP    Time 4   Period Weeks   Status New   Target Date 12/01/16     PT SHORT TERM GOAL #2   Title Patient will improve DGI score by 2 points to indicate a lesser fall risk (compared to last goal check).    Baseline --   Time 4   Period Weeks   Status New   Target Date 12/01/16     PT SHORT TERM GOAL #3   Title Patient will be able to maintain SLS > 3 seconds without UE support to demonstrate a lesser fall risk.    Baseline --   Time 4   Period Weeks   Status New   Target Date 12/01/16     PT SHORT TERM GOAL #4   Title Patient will ambulate 500 ft with LRAD modified independent on unlevel, outdoor paved surfaces including negotiating single step/curb.    Time 4   Period Weeks   Status New   Target Date 12/01/16           PT Long Term Goals  - 11/15/16 2231      PT LONG TERM GOAL #1   Title Patient will be independent with updated HEP    Baseline 7/24 HEP updated to remove gaze stabilization & added balance activities   Time 8   Period Weeks   Status  On-going     PT LONG TERM GOAL #2   Title Patient will improve DGI score to >=21 to indicate a lesser fall risk.   Baseline 7/24 not assessed due to time; 8/7 22/24   Time 8   Period Weeks   Status Achieved  TBA next visit and goal updated if approp     PT LONG TERM GOAL #3   Title Patient will improve her TUG score to <13.5 seconds to indicate a lesser fall risk.    Baseline 10.77 on 09/28/16   Time 8   Period Weeks   Status Achieved     PT LONG TERM GOAL #4   Title Patient will ambulate 500 ft with LRAD modified independent on unlevel, outdoor paved surfaces including negotiating single step/curb.    Baseline 7/24 simulated indoors with cane (see note) supervision with cane    Time 8   Period Weeks   Status Not Met     PT LONG TERM GOAL #5   Title Patient will be able to verbalize her plan for continued activity upon discharge from PT.    Time 8   Period Weeks   Status New     PT LONG TERM GOAL #6   Title Patient will be modified independent walking over unlevel ground outdoors (grass, mulch) with cane x 25 ft to simulate walking in her back yard.    Time 8   Period Weeks   Status New     PT LONG TERM GOAL #7   Title Patient will be modified independent with cane walking over paved outdoor surfaces x 1000 ft to simulate walking to downtown events.   Time 8   Period Weeks   Status New               Plan - 11/15/16 2225    Clinical Impression Statement DGI assessed with pt scoring 22/24 compared to initial 20/24 (and previous LTG met with no new goal related to DGI indicated). Session focused on vestibular rehab activities with pt feeling "off balance" primarily with walking and vertical head movements (eye movements alone did not cause feeling of  imbalance) and during activities on compliant surface with wide or narrow BOS. Patient at times reporting feeling very "off balance" when no unsteadiness perceived by PT. She becomes anxious when experiencing the sense of imbalance and quickly stops the activity. Patient questioned if "this could all be psychological" and discussed that anxiety could certainly be impacting her sense of balance as she has severe fear of falling. Do not however feel that is the only reason for her perceived imbalance. Unsteadiness primarily noted with walking with vertical head movements and with eyes closed with head movements. Patient can continue to benefit from PT to work towards updated goals.   Rehab Potential Good   Clinical Impairments Affecting Rehab Potential arthritis pain; fear of falling   PT Frequency 2x / week  goal to decr to 1x/week   PT Duration 8 weeks   PT Treatment/Interventions ADLs/Self Care Home Management;Functional mobility training;Stair training;Gait training;DME Instruction;Therapeutic activities;Therapeutic exercise;Balance training;Neuromuscular re-education;Cognitive remediation;Patient/family education;Orthotic Fit/Training;Passive range of motion;Energy conservation   PT Next Visit Plan add to HEP for balance as indicated (note, she is not going to do more than a few exercises, so don't overload HEP); work with cane outside (weather permitting) or on single steps "curbs" of varying height indoors; vertical head movements, eyes closed, compliant surfaces   Consulted and Agree with Plan of Care Patient  Patient will benefit from skilled therapeutic intervention in order to improve the following deficits and impairments:  Abnormal gait, Difficulty walking, Decreased coordination, Decreased balance, Decreased activity tolerance  Visit Diagnosis: Unsteadiness on feet  Difficulty in walking, not elsewhere classified     Problem List Patient Active Problem List   Diagnosis Date  Noted  . Aortic stenosis, mild 01/15/2016  . Bilateral carotid bruits 05/11/2015  . Hyponatremia 04/19/2015  . Chronic anemia 04/19/2015  . Panic attack 04/19/2015  . Constipation 04/19/2015  . Hyperkalemia 04/17/2015  . Influenza A 04/16/2015  . Proximal humerus fracture   . Left patella fracture   . Benign essential HTN   . Esophageal reflux   . Chronic diastolic heart failure ([place])   . Thyroid activity decreased   . Lower GI bleed 04/08/2015  . Hematochezia   . Rectal bleeding 04/03/2015  . Patellar fracture 03/06/2015  . Left humeral fracture 11/04/2013  . Rib fracture 11/04/2013  . Hypothyroid 11/04/2013  . Hypertension 11/04/2013  . OSA (obstructive sleep apnea) 11/13/2008  . BOOP (bronchiolitis obliterans with organizing pneumonia) ([place]) 08/25/2008    Rexanne Mano, PT 11/15/2016, 10:42 PM  [facility] [address] [place], [place], [address] Phone: [phone]   Fax:  [phone]  Name: Lisa Mcclure MRN: [id number] Date of Birth: 09-16-1932

## 2016-11-16 DIAGNOSIS — L570 Actinic keratosis: Secondary | ICD-10-CM | POA: Diagnosis not present

## 2016-11-16 DIAGNOSIS — L821 Other seborrheic keratosis: Secondary | ICD-10-CM | POA: Diagnosis not present

## 2016-11-18 ENCOUNTER — Encounter: Payer: Self-pay | Admitting: Physical Therapy

## 2016-11-18 ENCOUNTER — Ambulatory Visit: Payer: Medicare Other | Admitting: Physical Therapy

## 2016-11-18 DIAGNOSIS — R2681 Unsteadiness on feet: Secondary | ICD-10-CM

## 2016-11-18 DIAGNOSIS — R262 Difficulty in walking, not elsewhere classified: Secondary | ICD-10-CM

## 2016-11-18 DIAGNOSIS — M6281 Muscle weakness (generalized): Secondary | ICD-10-CM | POA: Diagnosis not present

## 2016-11-18 NOTE — Patient Instructions (Signed)
   Begin to work on hip flexibility These should be an easy, smooth movement. Do not push to the point of pain.   HIP / KNEE: Flexion, Heel Slides - Supine    Slide heel up toward buttocks, keeping leg in straight line. __10_ reps per set Use towel or pillowcase under heel as needed.  Copyright  VHI. All rights reserved.    [place], Supine    Lie on back, feet flat. Lower knees apart.  Repeat _5-10__ times per session.   Copyright  VHI. All rights reserved.

## 2016-11-18 NOTE — Therapy (Signed)
[facility] [address] [place], [place], [address] Phone: [phone]   Fax:  [phone]  Physical Therapy Treatment  Patient Details  Name: Lisa Mcclure MRN: [id number] Date of Birth: 1933-03-20 Referring Provider: [name]  Encounter Date: 11/18/2016      PT End of Session - 11/18/16 1642    Visit Number 13  Gcode due 20th visit   Number of Visits 33  recertified 6/80   Date for PT Re-Evaluation 12/31/16   Authorization Type Medicare; G-Code   PT Start Time 1533   PT Stop Time 1615   PT Time Calculation (min) 42 min   Equipment Utilized During Treatment Gait belt   Activity Tolerance Patient tolerated treatment well   Behavior During Therapy WFL for tasks assessed/performed      Past Medical History:  Diagnosis Date  . Anemia    h/o hemorrhoidal bleeding and blood transfusion  . Cardiomegaly   . Chronic diastolic CHF (congestive heart failure) ([place])   . Depression   . Diverticulosis   . DOE (dyspnea on exertion)   . Esophageal reflux   . GERD (gastroesophageal reflux disease)   . Hypothyroidism   . Insomnia   . LBP (low back pain)   . OAB (overactive bladder)   . Obesity   . OSA (obstructive sleep apnea)   . Osteoarthritis   . Rheumatoid arthritis(714.0)   . Shoulder pain, bilateral   . Unspecified essential hypertension   . Venous insufficiency     Past Surgical History:  Procedure Laterality Date  . APPENDECTOMY  1953  . bladder abduction-1996  1996  . breast biopsy Right 1980  . [place]  . COSMETIC SURGERY  1996  . CYSTOCELE REPAIR    . FLEXIBLE SIGMOIDOSCOPY N/A 04/10/2015   Procedure: FLEXIBLE SIGMOIDOSCOPY;  Surgeon: Arta Silence, MD;  Location: [facility] ENDOSCOPY;  Service: Endoscopy;  Laterality: N/A;  . knee arthroscopy Right 1996, 2010  . KNEE ARTHROSCOPY W/ AUTOGENOUS CARTILAGE IMPLANTATION (ACI) PROCEDURE Left 1994, 1995  . SHOULDER  SURGERY  1990  . TOTAL ABDOMINAL HYSTERECTOMY  1972  . VESICOVAGINAL FISTULA CLOSURE W/ TAH    . WRIST SURGERY  1967    There were no vitals filed for this visit.      Subjective Assessment - 11/18/16 1533    Subjective Very tired today and not sure why. Was a bit busy yesterday. Not sleeping well at all. When asked about walking downtown, she states she went the other day to a knitting shop and feels she is doing better on unlevel sidewalks and entrances.    Patient Stated Goals Be able to walk up and down a few steps; be able to go downtown to the events/galleries (uneven pavement)   Currently in Pain? Yes   Pain Score 6    Pain Location Shoulder   Pain Orientation Right   Pain Descriptors / Indicators Aching;Sore   Pain Type Chronic pain                         [facility] Adult PT Treatment/Exercise - 11/18/16 1633      Ambulation/Gait   Ambulation/Gait Yes   Ambulation/Gait Assistance 6: Modified independent (Device/Increase time)   Ambulation/Gait Assistance Details more antalgic compared to previous days with reports of greater knee pain   Ambulation Distance (Feet) 120 Feet  50, 50   Assistive device Straight cane   Gait Pattern  Step-through pattern;Decreased step length - right;Decreased step length - left;Right foot flat;Left foot flat;Wide base of support;Decreased trunk rotation   Ambulation Surface Level;Indoor   Curb 4: Min assist;5: Supervision   Curb Details (indicate cue type and reason) x4 reps (6.5" high, 7") pt required assist with 7" step and reports that is about the height of daughter's steps that she has difficulty with (7" onto Balancemaster)             Balance Exercises - 11/18/16 1638      Balance Exercises: Standing   Balance Master: Dynamic worked on weight-shifting anterior-posterior and left-right with pt having to move cursor to reach targets            PT Education - 11/18/16 1640    Education provided Yes   Education  Details see additions to HEP; discussed sleeping positions including possible use of wedge to elevate her head (she has tried and did not like) vs electric/adjustable bed   Person(s) Educated Patient   Methods Explanation;Handout   Comprehension Verbalized understanding;Need further instruction          PT Short Term Goals - 11/01/16 1731      PT SHORT TERM GOAL #1   Title Patient will be independent with updated HEP    Time 4   Period Weeks   Status New   Target Date 12/01/16     PT SHORT TERM GOAL #2   Title Patient will improve DGI score by 2 points to indicate a lesser fall risk (compared to last goal check).    Baseline --   Time 4   Period Weeks   Status New   Target Date 12/01/16     PT SHORT TERM GOAL #3   Title Patient will be able to maintain SLS > 3 seconds without UE support to demonstrate a lesser fall risk.    Baseline --   Time 4   Period Weeks   Status New   Target Date 12/01/16     PT SHORT TERM GOAL #4   Title Patient will ambulate 500 ft with LRAD modified independent on unlevel, outdoor paved surfaces including negotiating single step/curb.    Time 4   Period Weeks   Status New   Target Date 12/01/16           PT Long Term Goals - 11/15/16 2231      PT LONG TERM GOAL #1   Title Patient will be independent with updated HEP    Baseline 7/24 HEP updated to remove gaze stabilization & added balance activities   Time 8   Period Weeks   Status On-going     PT LONG TERM GOAL #2   Title Patient will improve DGI score to >=21 to indicate a lesser fall risk.   Baseline 7/24 not assessed due to time; 8/7 22/24   Time 8   Period Weeks   Status Achieved  TBA next visit and goal updated if approp     PT LONG TERM GOAL #3   Title Patient will improve her TUG score to <13.5 seconds to indicate a lesser fall risk.    Baseline 10.77 on 09/28/16   Time 8   Period Weeks   Status Achieved     PT LONG TERM GOAL #4   Title Patient will ambulate 500  ft with LRAD modified independent on unlevel, outdoor paved surfaces including negotiating single step/curb.    Baseline 7/24 simulated indoors with cane (see note)  supervision with cane    Time 8   Period Weeks   Status Not Met     PT LONG TERM GOAL #5   Title Patient will be able to verbalize her plan for continued activity upon discharge from PT.    Time 8   Period Weeks   Status New     PT LONG TERM GOAL #6   Title Patient will be modified independent walking over unlevel ground outdoors (grass, mulch) with cane x 25 ft to simulate walking in her back yard.    Time 8   Period Weeks   Status New     PT LONG TERM GOAL #7   Title Patient will be modified independent with cane walking over paved outdoor surfaces x 1000 ft to simulate walking to downtown events.   Time 8   Period Weeks   Status New               Plan - 11/18/16 1647    Clinical Impression Statement Session focused on balance training, gait training with focus on curbs of various heights, and instruction in hip stretches for HEP. Utilized Materials engineer for weightshifting exercises and finding/maintaining midline (initially with great difficulty and progressed to doing within 5 seconds at 25% target range). Progressing towards goals.    Rehab Potential Good   Clinical Impairments Affecting Rehab Potential arthritis pain; fear of falling   PT Frequency 2x / week  goal to decr to 1x/week   PT Duration 8 weeks   PT Treatment/Interventions ADLs/Self Care Home Management;Functional mobility training;Stair training;Gait training;DME Instruction;Therapeutic activities;Therapeutic exercise;Balance training;Neuromuscular re-education;Cognitive remediation;Patient/family education;Orthotic Fit/Training;Passive range of motion;Energy conservation   PT Next Visit Plan work with cane on single steps "curbs" of varying height indoors-reports her daughter's steps into home are tall (determined to be about height of  Balancemaster platform-7"); vertical head movements, eyes closed, compliant surfaces   Consulted and Agree with Plan of Care Patient      Patient will benefit from skilled therapeutic intervention in order to improve the following deficits and impairments:  Abnormal gait, Difficulty walking, Decreased coordination, Decreased balance, Decreased activity tolerance  Visit Diagnosis: Unsteadiness on feet  Difficulty in walking, not elsewhere classified     Problem List Patient Active Problem List   Diagnosis Date Noted  . Aortic stenosis, mild 01/15/2016  . Bilateral carotid bruits 05/11/2015  . Hyponatremia 04/19/2015  . Chronic anemia 04/19/2015  . Panic attack 04/19/2015  . Constipation 04/19/2015  . Hyperkalemia 04/17/2015  . Influenza A 04/16/2015  . Proximal humerus fracture   . Left patella fracture   . Benign essential HTN   . Esophageal reflux   . Chronic diastolic heart failure ([place])   . Thyroid activity decreased   . Lower GI bleed 04/08/2015  . Hematochezia   . Rectal bleeding 04/03/2015  . Patellar fracture 03/06/2015  . Left humeral fracture 11/04/2013  . Rib fracture 11/04/2013  . Hypothyroid 11/04/2013  . Hypertension 11/04/2013  . OSA (obstructive sleep apnea) 11/13/2008  . BOOP (bronchiolitis obliterans with organizing pneumonia) ([place]) 08/25/2008    Rexanne Mano, PT 11/18/2016, 4:57 PM  [facility] [address] [place], [place], [address] Phone: [phone]   Fax:  [phone]  Name: Lisa Mcclure MRN: [id number] Date of Birth: 1932-09-18

## 2016-11-22 ENCOUNTER — Ambulatory Visit: Payer: Medicare Other | Admitting: Physical Therapy

## 2016-11-22 DIAGNOSIS — R2681 Unsteadiness on feet: Secondary | ICD-10-CM | POA: Diagnosis not present

## 2016-11-22 DIAGNOSIS — H02413 Mechanical ptosis of bilateral eyelids: Secondary | ICD-10-CM | POA: Diagnosis not present

## 2016-11-22 DIAGNOSIS — H02535 Eyelid retraction left lower eyelid: Secondary | ICD-10-CM | POA: Diagnosis not present

## 2016-11-22 DIAGNOSIS — H02532 Eyelid retraction right lower eyelid: Secondary | ICD-10-CM | POA: Diagnosis not present

## 2016-11-22 DIAGNOSIS — R262 Difficulty in walking, not elsewhere classified: Secondary | ICD-10-CM

## 2016-11-22 DIAGNOSIS — M6281 Muscle weakness (generalized): Secondary | ICD-10-CM | POA: Diagnosis not present

## 2016-11-23 NOTE — Therapy (Signed)
[facility] [address] [place], [place], [address] Phone: [phone]   Fax:  [phone]  Physical Therapy Treatment  Patient Details  Name: Lisa Mcclure MRN: [id number] Date of Birth: [birth year] Referring Provider: [name]  Encounter Date: 11/22/2016      PT End of Session - 11/23/16 1122    Visit Number 14  G4   Number of Visits 33   Authorization Type Medicare; G-Code   PT Start Time 8841   PT Stop Time 1446   PT Time Calculation (min) 44 min      Past Medical History:  Diagnosis Date  . Anemia    h/o hemorrhoidal bleeding and blood transfusion  . Cardiomegaly   . Chronic diastolic CHF (congestive heart failure) ([place])   . Depression   . Diverticulosis   . DOE (dyspnea on exertion)   . Esophageal reflux   . GERD (gastroesophageal reflux disease)   . Hypothyroidism   . Insomnia   . LBP (low back pain)   . OAB (overactive bladder)   . Obesity   . OSA (obstructive sleep apnea)   . Osteoarthritis   . Rheumatoid arthritis(714.0)   . Shoulder pain, bilateral   . Unspecified essential hypertension   . Venous insufficiency     Past Surgical History:  Procedure Laterality Date  . APPENDECTOMY  1953  . bladder abduction-1996  1996  . breast biopsy Right 1980  . [place]  . COSMETIC SURGERY  1996  . CYSTOCELE REPAIR    . FLEXIBLE SIGMOIDOSCOPY N/A 04/10/2015   Procedure: FLEXIBLE SIGMOIDOSCOPY;  Surgeon: Arta Silence, MD;  [facility] ENDOSCOPY;  Service: Endoscopy;  Laterality: N/A;  . knee arthroscopy Right 1996, 2010  . KNEE ARTHROSCOPY W/ AUTOGENOUS CARTILAGE IMPLANTATION (ACI) PROCEDURE Left 1994, 1995  . SHOULDER SURGERY  1990  . TOTAL ABDOMINAL HYSTERECTOMY  1972  . VESICOVAGINAL FISTULA CLOSURE W/ TAH    . WRIST SURGERY  1967    There were no vitals filed for this visit.      Subjective Assessment - 11/23/16 1112    Subjective Pt states  that the exercise (hip abdct. in supine) given to her last session really bothered her hips - had much discomfort in both hips after doing this movement   Patient Stated Goals Be able to walk up and down a few steps; be able to go downtown to the events/galleries (uneven pavement)   Currently in Pain? Other (Comment)  pt has point tenderness with deep palpation over greater trochanter region of hips with left hip > right hip tenderness                         [facility] Adult PT Treatment/Exercise - 11/23/16 0001      Ambulation/Gait   Ambulation/Gait Yes   Ambulation/Gait Assistance 6: Modified independent (Device/Increase time)   Ambulation/Gait Assistance Details antalgic gait pattern due to Rt knee pain and bil. hip discomfort   Ambulation Distance (Feet) 100 Feet   Assistive device Straight cane   Gait Pattern Step-through pattern;Decreased step length - right;Decreased step length - left;Right foot flat;Left foot flat;Wide base of support;Decreased trunk rotation   Ambulation Surface Level;Indoor   Stairs Yes   Stairs Assistance 5: Supervision   Stairs Assistance Details (indicate cue type and reason) rail need to provide assisting with pulling up as pt unable to push up on step > 6" with use  of cane   Stair Management Technique One rail Right;Forwards;With cane   Number of Stairs 4   Height of Stairs 6   Ramp 5: Supervision  with cane   Curb 4: Min assist  CGA with cane   Curb Details (indicate cue type and reason) x 2 reps     Knee/Hip Exercises: Aerobic   Nustep Level 3 (decr. level due to hip discomfort) x 5" with LE's only      Knee/Hip Exercises: Standing   Hip Abduction AROM;Both;1 set;10 reps;Knee straight      Practiced negotiating steps/curb of various heights - 6" step, curb in clinic and 8" step with use of rail at steps  Pt has slight unsteadiness with curb negotiation in clinic with use of cane only due to difficulty stepping up Pt able to safely  step up onto 6" step with use of rail and cane  Able to step up onto 8" step with use of rail at steps for pulling up - unable to negotiate this step with cane only          PT Education - 11/23/16 1121    Education provided Yes   Education Details changed position for active hip abduction from hooklying in supine to standing due to c/o resultant hip pain/discomfort   Person(s) Educated Patient   Methods Explanation;Demonstration   Comprehension Verbalized understanding;Returned demonstration          PT Short Term Goals - 11/01/16 1731      PT SHORT TERM GOAL #1   Title Patient will be independent with updated HEP    Time 4   Period Weeks   Status New   Target Date 12/01/16     PT SHORT TERM GOAL #2   Title Patient will improve DGI score by 2 points to indicate a lesser fall risk (compared to last goal check).    Baseline --   Time 4   Period Weeks   Status New   Target Date 12/01/16     PT SHORT TERM GOAL #3   Title Patient will be able to maintain SLS > 3 seconds without UE support to demonstrate a lesser fall risk.    Baseline --   Time 4   Period Weeks   Status New   Target Date 12/01/16     PT SHORT TERM GOAL #4   Title Patient will ambulate 500 ft with LRAD modified independent on unlevel, outdoor paved surfaces including negotiating single step/curb.    Time 4   Period Weeks   Status New   Target Date 12/01/16           PT Long Term Goals - 11/15/16 2231      PT LONG TERM GOAL #1   Title Patient will be independent with updated HEP    Baseline 7/24 HEP updated to remove gaze stabilization & added balance activities   Time 8   Period Weeks   Status On-going     PT LONG TERM GOAL #2   Title Patient will improve DGI score to >=21 to indicate a lesser fall risk.   Baseline 7/24 not assessed due to time; 8/7 22/24   Time 8   Period Weeks   Status Achieved  TBA next visit and goal updated if approp     PT LONG TERM GOAL #3   Title Patient  will improve her TUG score to <13.5 seconds to indicate a lesser fall risk.    Baseline 10.77 on  09/28/16   Time 8   Period Weeks   Status Achieved     PT LONG TERM GOAL #4   Title Patient will ambulate 500 ft with LRAD modified independent on unlevel, outdoor paved surfaces including negotiating single step/curb.    Baseline 7/24 simulated indoors with cane (see note) supervision with cane    Time 8   Period Weeks   Status Not Met     PT LONG TERM GOAL #5   Title Patient will be able to verbalize her plan for continued activity upon discharge from PT.    Time 8   Period Weeks   Status New     PT LONG TERM GOAL #6   Title Patient will be modified independent walking over unlevel ground outdoors (grass, mulch) with cane x 25 ft to simulate walking in her back yard.    Time 8   Period Weeks   Status New     PT LONG TERM GOAL #7   Title Patient will be modified independent with cane walking over paved outdoor surfaces x 1000 ft to simulate walking to downtown events.   Time 8   Period Weeks   Status New               Plan - 11/23/16 1123    Clinical Impression Statement Pt has symptoms consistent with left trochanteric bursitis with c/o significant point tenderness with deep palpation over Lt trochanteric region; some discomfort reported in Rt hip in this region but not as severe as that reported in left hip.  Pt does not appear to have abilityto safely negotiate steps > 6" in height with use of cane only due to hip and knee dysfunction and decrased strenght.  Pt able to negotiate steps >6" with use of rail for pulling up due to pt's inability to push up. Pt may benefit from strengthening exercises for hip after etiolgy of hip pain is determined.                                                                     Rehab Potential Good   Clinical Impairments Affecting Rehab Potential arthritis pain; fear of falling   PT Frequency 2x / week   PT Duration 8 weeks   PT  Treatment/Interventions ADLs/Self Care Home Management;Functional mobility training;Stair training;Gait training;DME Instruction;Therapeutic activities;Therapeutic exercise;Balance training;Neuromuscular re-education;Cognitive remediation;Patient/family education;Orthotic Fit/Training;Passive range of motion;Energy conservation   PT Next Visit Plan BEGIN CHECKING STG'S as they ARE DUE 12-01-16: work with cane on single steps "curbs" of varying height indoors-reports her daughter's steps into home are tall (determined to be about height of Balancemaster platform-7"); vertical head movements, eyes closed, compliant surfaces  Strengthening exericses for hip if pt able to tolerate - pt states she is waiting to hear from Dr. Anne Fu office for appt    Consulted and Agree with Plan of Care Patient      Patient will benefit from skilled therapeutic intervention in order to improve the following deficits and impairments:  Abnormal gait, Difficulty walking, Decreased coordination, Decreased balance, Decreased activity tolerance  Visit Diagnosis: Difficulty in walking, not elsewhere classified  Muscle weakness (generalized)     Problem List Patient Active Problem List   Diagnosis Date Noted  . Aortic  stenosis, mild 01/15/2016  . Bilateral carotid bruits 05/11/2015  . Hyponatremia 04/19/2015  . Chronic anemia 04/19/2015  . Panic attack 04/19/2015  . Constipation 04/19/2015  . Hyperkalemia 04/17/2015  . Influenza A 04/16/2015  . Proximal humerus fracture   . Left patella fracture   . Benign essential HTN   . Esophageal reflux   . Chronic diastolic heart failure ([place])   . Thyroid activity decreased   . Lower GI bleed 04/08/2015  . Hematochezia   . Rectal bleeding 04/03/2015  . Patellar fracture 03/06/2015  . Left humeral fracture 11/04/2013  . Rib fracture 11/04/2013  . Hypothyroid 11/04/2013  . Hypertension 11/04/2013  . OSA (obstructive sleep apnea) 11/13/2008  . BOOP (bronchiolitis  obliterans with organizing pneumonia) ([place]) 08/25/2008    Demeshia Sherburne, [place], PT 11/23/2016, 11:34 AM  [facility] [address] [place], [place], [address] Phone: [phone]   Fax:  [phone]  Name: Elzina Devera MRN: [id number] Date of Birth: October 31, 1932

## 2016-11-25 ENCOUNTER — Ambulatory Visit: Payer: Medicare Other | Admitting: Physical Therapy

## 2016-11-29 ENCOUNTER — Ambulatory Visit: Payer: Medicare Other | Admitting: Physical Therapy

## 2016-12-01 ENCOUNTER — Encounter: Payer: Self-pay | Admitting: Physical Therapy

## 2016-12-01 ENCOUNTER — Ambulatory Visit: Payer: Medicare Other | Admitting: Physical Therapy

## 2016-12-01 DIAGNOSIS — R262 Difficulty in walking, not elsewhere classified: Secondary | ICD-10-CM

## 2016-12-01 DIAGNOSIS — M6281 Muscle weakness (generalized): Secondary | ICD-10-CM

## 2016-12-01 DIAGNOSIS — R2681 Unsteadiness on feet: Secondary | ICD-10-CM | POA: Diagnosis not present

## 2016-12-01 NOTE — Therapy (Signed)
[facility] [address] [place], [place], [address] Phone: [phone]   Fax:  [phone]  Physical Therapy Treatment  Patient Details  Name: Lisa Mcclure MRN: [id number] Date of Birth: 1933-01-03 Referring Provider: [name]  Encounter Date: 12/01/2016      PT End of Session - 12/01/16 1742    Visit Number 15  G code due 19th visit   Number of Visits 33   Date for PT Re-Evaluation 12/31/16   Authorization Type Medicare; G-Code   PT Start Time 1533   PT Stop Time 1617   PT Time Calculation (min) 44 min   Activity Tolerance Patient tolerated treatment well   Behavior During Therapy [facility] for tasks assessed/performed      Past Medical History:  Diagnosis Date  . Anemia    h/o hemorrhoidal bleeding and blood transfusion  . Cardiomegaly   . Chronic diastolic CHF (congestive heart failure) ([place])   . Depression   . Diverticulosis   . DOE (dyspnea on exertion)   . Esophageal reflux   . GERD (gastroesophageal reflux disease)   . Hypothyroidism   . Insomnia   . LBP (low back pain)   . OAB (overactive bladder)   . Obesity   . OSA (obstructive sleep apnea)   . Osteoarthritis   . Rheumatoid arthritis(714.0)   . Shoulder pain, bilateral   . Unspecified essential hypertension   . Venous insufficiency     Past Surgical History:  Procedure Laterality Date  . APPENDECTOMY  1953  . bladder abduction-1996  1996  . breast biopsy Right 1980  . [place]  . COSMETIC SURGERY  1996  . CYSTOCELE REPAIR    . FLEXIBLE SIGMOIDOSCOPY N/A 04/10/2015   Procedure: FLEXIBLE SIGMOIDOSCOPY;  Surgeon: Arta Silence, MD;  Location[facility] END[facility]COPY;  Service: Endoscopy;  Laterality: N/A;  . knee arthroscopy Right 1996, 2010  . KNEE ARTHROSCOPY W/ AUTOGENOUS CARTILAGE IMPLANTATION (ACI) PROCEDURE Left 1994, 1995  . SHOULDER SURGERY  1990  . TOTAL ABDOMINAL HYSTERECTOMY  1972  .  VESICOVAGINAL FISTULA CLOSURE W/ TAH    . WRIST SURGERY  1967    There were no vitals filed for this visit.      Subjective Assessment - 12/01/16 1535    Subjective States she is doing the alternative hip exercise and hip has felt OK. Reports she knows she is stronger and balance improved as she can stand and lift foot into her slipper without holding on to anything and could not before.    Patient Stated Goals Be able to walk up and down a few steps; be able to go downtown to the events/galleries (uneven pavement)   Currently in Pain? Other (Comment)  always, let's not talk about it            [facility] PT Assessment - 12/01/16 0001      Dynamic Gait Index   Level Surface Normal  6.53   Change in Gait Speed Normal   Gait with Horizontal Head Turns Normal   Gait with Vertical Head Turns Mild Impairment   Gait and Pivot Turn Normal   Step Over Obstacle Normal   Step Around Obstacles Normal   Steps Mild Impairment   Total Score 22                     [facility] Adult PT Treatment/Exercise - 12/01/16 0001      Ambulation/Gait   Ambulation/Gait  Assistance 6: Modified independent (Device/Increase time)   Ambulation/Gait Assistance Details outside x 750 ft with curbs, incline, decline, grass   Ambulation Distance (Feet) 750 Feet   Assistive device Straight cane   Gait Pattern Step-through pattern;Decreased step length - right;Decreased step length - left;Wide base of support;Decreased trunk rotation   Ambulation Surface Level;Unlevel;Indoor;Outdoor;Paved;Grass;Gravel   Curb 6: Modified independent (Device/increase time)   Curb Details (indicate cue type and reason) in parking lot with [facility]     Knee/Hip Exercises: Stretches   Hip Flexor Stretch Both;1 rep;20 seconds  runners stretch at Publishing rights manager Both;1 rep;20 seconds   Gastroc Stretch Limitations runners stretch at [employer] Exercises - 12/01/16 1737      Balance Exercises:  Standing   Standing Eyes Opened Wide (BOA);Head turns;Foam/compliant surface;Narrow base of support (BOS)  horizontal and vertical   Standing Eyes Closed Wide (BOA);Foam/compliant surface   SLS Eyes open;Solid surface;Upper extremity support 1;30 secs  1 finger support at // bars; each leg x 30 sec; unable no su   Gait with Head Turns Forward  staggr to lt when ooking up   Turning Both;5 reps  imbalance when turning rt x 2           PT Education - 12/01/16 1741    Education provided Yes   Education Details importance of gentle activity/ROM in joints with arthritis; see addition to HEP to get anterior hip stretch   Person(s) Educated Patient   Methods Explanation;Demonstration;Tactile cues;Verbal cues;Handout   Comprehension Verbalized understanding;Returned demonstration;Verbal cues required;Tactile cues required;Need further instruction          PT Short Term Goals - 12/01/16 1746      PT SHORT TERM GOAL #1   Title Patient will be independent with updated HEP    Time 4   Period Weeks   Status Achieved     PT SHORT TERM GOAL #2   Title Patient will improve DGI score by 2 points to indicate a lesser fall risk (compared to last goal check).    Baseline 12/01/16 22/24   Time 4   Period Weeks   Status Achieved     PT SHORT TERM GOAL #3   Title Patient will be able to maintain SLS > 3 seconds without UE support to demonstrate a lesser fall risk.    Baseline 12/01/16 <1 sec without UE support; with single finger support x 30 sec   Time 4   Period Weeks   Status Not Met     PT SHORT TERM GOAL #4   Title Patient will ambulate 500 ft with LRAD modified independent on unlevel, outdoor paved surfaces including negotiating single step/curb.    Time 4   Period Weeks   Status Achieved           PT Long Term Goals - 12/01/16 1753      PT LONG TERM GOAL #1   Title Patient will be independent with updated HEP    Time 8   Period Weeks   Status On-going     PT LONG TERM  GOAL #4   Title Patient will ambulate 500 ft with LRAD modified independent on unlevel, outdoor paved surfaces including negotiating single step/curb.    Baseline 7/24 simulated indoors with cane (see note) supervision with cane ; 12/01/16 met   Time 8   Period Weeks   Status Achieved     PT LONG  TERM GOAL #5   Title Patient will be able to verbalize her plan for continued activity upon discharge from PT.    Time 8   Period Weeks   Status New     PT LONG TERM GOAL #6   Title Patient will be modified independent walking over unlevel ground outdoors (grass, mulch) with cane x 25 ft to simulate walking in her back yard.    Baseline 8/23  with supervision   Time 8   Period Weeks   Status On-going     PT LONG TERM GOAL #7   Title Patient will be modified independent with cane walking over paved outdoor surfaces x 1000 ft to simulate walking to downtown events.   Baseline 8/23 700 ft   Time 8   Period Weeks   Status On-going               Plan - 12/01/16 1748    Clinical Impression Statement Session focused on assessing progress towards STGs. Pt met 3 of 4 STG, and although has improved with SLS with minimal UE support still cannot due SLS with not UE support. Reviewed HEP as part of goal check and discussed availabilty of recumbent cycle or Nustep type equipment for rhythmic, gentle AROM of bil hips. She will look into usiing the Nustep at the [facility]. Continues to improve.    Rehab Potential Good   Clinical Impairments Affecting Rehab Potential arthritis pain; fear of falling   PT Frequency 2x / week   PT Duration 8 weeks   PT Treatment/Interventions ADLs/Self Care Home Management;Functional mobility training;Stair training;Gait training;DME Instruction;Therapeutic activities;Therapeutic exercise;Balance training;Neuromuscular re-education;Cognitive remediation;Patient/family education;Orthotic Fit/Training;Passive range of motion;Energy conservation   PT Next Visit Plan  work with cane on single steps "curbs" of varying height indoors-reports her daughter's steps into home are tall (determined to be about height of Balancemaster platform-7"); vertical head movements, eyes closed, compliant surfaces  Strengthening exericses for hip if pt able to tolerate - pt states she is waiting to hear from Dr. Anne Fu office for appt    Consulted and Agree with Plan of Care Patient      Patient will benefit from skilled therapeutic intervention in order to improve the following deficits and impairments:  Abnormal gait, Difficulty walking, Decreased coordination, Decreased balance, Decreased activity tolerance  Visit Diagnosis: Difficulty in walking, not elsewhere classified  Muscle weakness (generalized)  Unsteadiness on feet     Problem List Patient Active Problem List   Diagnosis Date Noted  . Aortic stenosis, mild 01/15/2016  . Bilateral carotid bruits 05/11/2015  . Hyponatremia 04/19/2015  . Chronic anemia 04/19/2015  . Panic attack 04/19/2015  . Constipation 04/19/2015  . Hyperkalemia 04/17/2015  . Influenza A 04/16/2015  . Proximal humerus fracture   . Left patella fracture   . Benign essential HTN   . Esophageal reflux   . Chronic diastolic heart failure ([place])   . Thyroid activity decreased   . Lower GI bleed 04/08/2015  . Hematochezia   . Rectal bleeding 04/03/2015  . Patellar fracture 03/06/2015  . Left humeral fracture 11/04/2013  . Rib fracture 11/04/2013  . Hypothyroid 11/04/2013  . Hypertension 11/04/2013  . OSA (obstructive sleep apnea) 11/13/2008  . BOOP (bronchiolitis obliterans with organizing pneumonia) ([place]) 08/25/2008    Rexanne Mano, PT 12/01/2016, 5:57 PM  [facility] [address] [place], [place], [address] Phone: [phone]   Fax:  [phone]  Name: Lisa Mcclure MRN: [id number] Date  of Birth: May 31, 1932

## 2016-12-01 NOTE — Patient Instructions (Signed)
Achilles Tendon Stretch    Stand with hands supported on wall or counter, elbows slightly bent, feet parallel and both heels on floor, bend front knee and keep shoulders up, back knee straight. Slowly relax and feel the stretch in the front of your hip and down the back of your leg. Hold _10-30___ seconds. Repeat with leg positions switched.  Copyright  VHI. All rights reserved.

## 2016-12-06 ENCOUNTER — Ambulatory Visit: Payer: Medicare Other | Admitting: Physical Therapy

## 2016-12-06 ENCOUNTER — Encounter: Payer: Self-pay | Admitting: Physical Therapy

## 2016-12-06 DIAGNOSIS — R2681 Unsteadiness on feet: Secondary | ICD-10-CM

## 2016-12-06 DIAGNOSIS — R262 Difficulty in walking, not elsewhere classified: Secondary | ICD-10-CM

## 2016-12-06 DIAGNOSIS — M6281 Muscle weakness (generalized): Secondary | ICD-10-CM

## 2016-12-06 NOTE — Therapy (Addendum)
[facility] [address] [place], [place], [address] Phone: [phone]   Fax:  [phone]  Physical Therapy Treatment  Patient Details  Name: Lisa Mcclure MRN: [id number] Date of Birth: 1932/05/07 Referring Provider: [name]  Encounter Date: 12/06/2016      PT End of Session - 12/06/16 1600    Visit Number 16  G code due 19th visit   Number of Visits 33   Date for PT Re-Evaluation 12/31/16   Authorization Type Medicare; G-Code   PT Start Time 2446   PT Stop Time 2863  Session ended early due to power outage   PT Time Calculation (min) 35 min   Equipment Utilized During Treatment Gait belt   Activity Tolerance Patient tolerated treatment well   Behavior During Therapy [facility] for tasks assessed/performed      Past Medical History:  Diagnosis Date  . Anemia    h/o hemorrhoidal bleeding and blood transfusion  . Cardiomegaly   . Chronic diastolic CHF (congestive heart failure) ([place])   . Depression   . Diverticulosis   . DOE (dyspnea on exertion)   . Esophageal reflux   . GERD (gastroesophageal reflux disease)   . Hypothyroidism   . Insomnia   . LBP (low back pain)   . OAB (overactive bladder)   . Obesity   . OSA (obstructive sleep apnea)   . Osteoarthritis   . Rheumatoid arthritis(714.0)   . Shoulder pain, bilateral   . Unspecified essential hypertension   . Venous insufficiency     Past Surgical History:  Procedure Laterality Date  . APPENDECTOMY  1953  . bladder abduction-1996  1996  . breast biopsy Right 1980  . [place]  . COSMETIC SURGERY  1996  . CYSTOCELE REPAIR    . FLEXIBLE SIGMOIDOSCOPY N/A 04/10/2015   Procedure: FLEXIBLE SIGMOIDOSCOPY;  Surgeon: Arta Silence, MD;  Location: [facility] ENDOSCOPY;  Service: Endoscopy;  Laterality: N/A;  . knee arthroscopy Right 1996, 2010  . KNEE ARTHROSCOPY W/ AUTOGENOUS CARTILAGE IMPLANTATION (ACI) PROCEDURE Left  1994, 1995  . SHOULDER SURGERY  1990  . TOTAL ABDOMINAL HYSTERECTOMY  1972  . VESICOVAGINAL FISTULA CLOSURE W/ TAH    . WRIST SURGERY  1967    There were no vitals filed for this visit.      Subjective Assessment - 12/06/16 1539    Subjective I'm doing good. Discussing concerns re: her walking and knee pain when she goes to [place] late September   Patient Stated Goals Be able to walk up and down a few steps; be able to go downtown to the events/galleries (uneven pavement)   Currently in Pain? Other (Comment)  always. Let's not talk about it                         [facility] Adult PT Treatment/Exercise - 12/06/16 2202      Ambulation/Gait   Ambulation/Gait Assistance 6: Modified independent (Device/Increase time)   Ambulation/Gait Assistance Details using cane   Ambulation Distance (Feet) 60 Feet  100, 120   Assistive device Straight cane   Gait Pattern Step-through pattern;Decreased step length - right;Decreased step length - left;Wide base of support;Decreased trunk rotation   Ambulation Surface Level;Indoor   Curb 5: Supervision;4: Min assist   Curb Details (indicate cue type and reason) 6" x 2; 7" x 2 with cane      Knee/Hip Exercises: Aerobic   Nustep seat  11, L2 x 3 min 30 sec     Knee/Hip Exercises: Machines for Strengthening   Cybex Leg Press 60# x 15 reps  some difficulty getting into machine due to limited knee flx                  PT Short Term Goals - 12/01/16 1746      PT SHORT TERM GOAL #1   Title Patient will be independent with updated HEP    Time 4   Period Weeks   Status Achieved     PT SHORT TERM GOAL #2   Title Patient will improve DGI score by 2 points to indicate a lesser fall risk (compared to last goal check).    Baseline 12/01/16 22/24   Time 4   Period Weeks   Status Achieved     PT SHORT TERM GOAL #3   Title Patient will be able to maintain SLS > 3 seconds without UE support to demonstrate a lesser fall risk.     Baseline 12/01/16 <1 sec without UE support; with single finger support x 30 sec   Time 4   Period Weeks   Status Not Met     PT SHORT TERM GOAL #4   Title Patient will ambulate 500 ft with LRAD modified independent on unlevel, outdoor paved surfaces including negotiating single step/curb.    Time 4   Period Weeks   Status Achieved           PT Long Term Goals - 12/01/16 1753      PT LONG TERM GOAL #1   Title Patient will be independent with updated HEP    Time 8   Period Weeks   Status On-going     PT LONG TERM GOAL #4   Title Patient will ambulate 500 ft with LRAD modified independent on unlevel, outdoor paved surfaces including negotiating single step/curb.    Baseline 7/24 simulated indoors with cane (see note) supervision with cane ; 12/01/16 met   Time 8   Period Weeks   Status Achieved     PT LONG TERM GOAL #5   Title Patient will be able to verbalize her plan for continued activity upon discharge from PT.    Time 8   Period Weeks   Status New     PT LONG TERM GOAL #6   Title Patient will be modified independent walking over unlevel ground outdoors (grass, mulch) with cane x 25 ft to simulate walking in her back yard.    Baseline 8/23  with supervision   Time 8   Period Weeks   Status On-going     PT LONG TERM GOAL #7   Title Patient will be modified independent with cane walking over paved outdoor surfaces x 1000 ft to simulate walking to downtown events.   Baseline 8/23 700 ft   Time 8   Period Weeks   Status On-going               Plan - 12/06/16 2216    Clinical Impression Statement Patient initially distracted by discussing her upcoming trip to [place] and that she hopes she will be in better shape to tolerate walking whle there. She reports she does have a ?transport chair that can also be pushed as a type of RW and questioned if she should take this (YEs!) Session shortened due to power outage in the office/building. Patient making slow progress  (primarily due to talkative during session and at  tinmes hard to get her to focus on activity/exercise).    Rehab Potential Good   Clinical Impairments Affecting Rehab Potential arthritis pain; fear of falling   PT Frequency 2x / week   PT Duration 8 weeks   PT Treatment/Interventions ADLs/Self Care Home Management;Functional mobility training;Stair training;Gait training;DME Instruction;Therapeutic activities;Therapeutic exercise;Balance training;Neuromuscular re-education;Cognitive remediation;Patient/family education;Orthotic Fit/Training;Passive range of motion;Energy conservation   PT Next Visit Plan work with cane on single steps "curbs" of varying height indoors-reports her daughter's steps into home are tall (determined to be about height of Balancemaster platform-7"); vertical head movements, eyes closed, compliant surfaces  Strengthening exericses for hip if pt able to tolerate - pt states she is waiting to hear from Dr. Anne Fu office for appt    Consulted and Agree with Plan of Care Patient      Patient will benefit from skilled therapeutic intervention in order to improve the following deficits and impairments:  Abnormal gait, Difficulty walking, Decreased coordination, Decreased balance, Decreased activity tolerance  Visit Diagnosis: Muscle weakness (generalized)  Unsteadiness on feet  Difficulty in walking, not elsewhere classified     Problem List Patient Active Problem List   Diagnosis Date Noted  . Aortic stenosis, mild 01/15/2016  . Bilateral carotid bruits 05/11/2015  . Hyponatremia 04/19/2015  . Chronic anemia 04/19/2015  . Panic attack 04/19/2015  . Constipation 04/19/2015  . Hyperkalemia 04/17/2015  . Influenza A 04/16/2015  . Proximal humerus fracture   . Left patella fracture   . Benign essential HTN   . Esophageal reflux   . Chronic diastolic heart failure ([place])   . Thyroid activity decreased   . Lower GI bleed 04/08/2015  . Hematochezia   .  Rectal bleeding 04/03/2015  . Patellar fracture 03/06/2015  . Left humeral fracture 11/04/2013  . Rib fracture 11/04/2013  . Hypothyroid 11/04/2013  . Hypertension 11/04/2013  . OSA (obstructive sleep apnea) 11/13/2008  . BOOP (bronchiolitis obliterans with organizing pneumonia) ([place]) 08/25/2008    Rexanne Mano, PT 12/06/2016, 10:25 PM  [facility] [address] [place], [place], [address] Phone: [phone]   Fax:  [phone]  Name: Lisa Mcclure MRN: [id number] Date of Birth: 1932/12/18

## 2016-12-08 ENCOUNTER — Ambulatory Visit: Payer: Medicare Other | Admitting: Physical Therapy

## 2016-12-13 ENCOUNTER — Encounter: Payer: Self-pay | Admitting: Physical Therapy

## 2016-12-13 ENCOUNTER — Ambulatory Visit: Payer: Medicare Other | Attending: Internal Medicine | Admitting: Physical Therapy

## 2016-12-13 DIAGNOSIS — R2681 Unsteadiness on feet: Secondary | ICD-10-CM | POA: Insufficient documentation

## 2016-12-13 DIAGNOSIS — R262 Difficulty in walking, not elsewhere classified: Secondary | ICD-10-CM

## 2016-12-13 DIAGNOSIS — M6281 Muscle weakness (generalized): Secondary | ICD-10-CM | POA: Insufficient documentation

## 2016-12-13 NOTE — Therapy (Signed)
[facility] [address] [place], [place], [address] Phone: [phone]   Fax:  [phone]  Patient Details  Name: Lisa Mcclure MRN: [id number] Date of Birth: June 27, 1932 Referring Provider:  [name], MD  Encounter Date: 12/13/2016  Patient arrived and reported she did something to her knee while she slept. The pain woke her up and she feels her knee is now swollen. Patient with 8/10 pain in weight-bearing (came in using her cane) and with palpation of joint line medial to her patella.   Educated patient to use ice (20 min on/20 min off), elevation, and gentle AROM in midrange (knee flexion 45-100). We discussed her need to fly to [place] this Friday for a funeral and how she can use airport wheelchairs. Recommended she take her lightweight "wheelchair" for walking in the [place], however she does not feel the need--stating her son will be with her and she can hold onto him.   Patient unable to tolerate working on balance or LE strengthening due to knee pain.     Rexanne Mano, PT 12/13/2016, 4:01 PM  [facility] [address] [place], [place], [address] Phone: [phone]   Fax:  [phone]

## 2016-12-15 ENCOUNTER — Ambulatory Visit: Payer: Medicare Other | Admitting: Physical Therapy

## 2016-12-16 DIAGNOSIS — I10 Essential (primary) hypertension: Secondary | ICD-10-CM | POA: Diagnosis not present

## 2016-12-16 DIAGNOSIS — R350 Frequency of micturition: Secondary | ICD-10-CM | POA: Diagnosis not present

## 2016-12-16 DIAGNOSIS — N3281 Overactive bladder: Secondary | ICD-10-CM | POA: Diagnosis not present

## 2016-12-16 DIAGNOSIS — G2581 Restless legs syndrome: Secondary | ICD-10-CM | POA: Diagnosis not present

## 2016-12-16 DIAGNOSIS — R2689 Other abnormalities of gait and mobility: Secondary | ICD-10-CM | POA: Diagnosis not present

## 2016-12-16 DIAGNOSIS — I5032 Chronic diastolic (congestive) heart failure: Secondary | ICD-10-CM | POA: Diagnosis not present

## 2016-12-16 DIAGNOSIS — Z6837 Body mass index (BMI) 37.0-37.9, adult: Secondary | ICD-10-CM | POA: Diagnosis not present

## 2016-12-16 DIAGNOSIS — Z23 Encounter for immunization: Secondary | ICD-10-CM | POA: Diagnosis not present

## 2016-12-20 ENCOUNTER — Ambulatory Visit: Payer: Medicare Other | Admitting: Physical Therapy

## 2016-12-20 ENCOUNTER — Encounter: Payer: Self-pay | Admitting: Physical Therapy

## 2016-12-20 DIAGNOSIS — R2681 Unsteadiness on feet: Secondary | ICD-10-CM | POA: Diagnosis not present

## 2016-12-20 DIAGNOSIS — M6281 Muscle weakness (generalized): Secondary | ICD-10-CM | POA: Diagnosis not present

## 2016-12-20 DIAGNOSIS — R262 Difficulty in walking, not elsewhere classified: Secondary | ICD-10-CM | POA: Diagnosis not present
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
## 2016-12-22 ENCOUNTER — Ambulatory Visit: Payer: Medicare Other | Admitting: Physical Therapy

## 2016-12-27 ENCOUNTER — Ambulatory Visit: Payer: Medicare Other | Admitting: Physical Therapy

## 2016-12-27 ENCOUNTER — Encounter: Payer: Self-pay | Admitting: Physical Therapy

## 2016-12-27 DIAGNOSIS — R2681 Unsteadiness on feet: Secondary | ICD-10-CM | POA: Diagnosis not present

## 2016-12-27 DIAGNOSIS — R262 Difficulty in walking, not elsewhere classified: Secondary | ICD-10-CM | POA: Diagnosis not present

## 2016-12-27 DIAGNOSIS — M6281 Muscle weakness (generalized): Secondary | ICD-10-CM | POA: Diagnosis not present
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
## 2016-12-29 ENCOUNTER — Ambulatory Visit: Payer: Medicare Other | Admitting: Physical Therapy

## 2017-01-03 ENCOUNTER — Encounter: Payer: Self-pay | Admitting: Physical Therapy

## 2017-01-03 ENCOUNTER — Ambulatory Visit: Payer: Medicare Other | Admitting: Physical Therapy

## 2017-01-03 DIAGNOSIS — R2681 Unsteadiness on feet: Secondary | ICD-10-CM

## 2017-01-03 DIAGNOSIS — R262 Difficulty in walking, not elsewhere classified: Secondary | ICD-10-CM | POA: Diagnosis not present

## 2017-01-03 DIAGNOSIS — M6281 Muscle weakness (generalized): Secondary | ICD-10-CM | POA: Diagnosis not present

## 2017-01-04 NOTE — Therapy (Signed)
[facility] [address] [place], [place], [address] Phone: [phone]   Fax:  [phone]  Physical Therapy Treatment and Discharge Summary  Patient Details  Name: Lisa Mcclure MRN: [id number] Date of Birth: Apr 11, 1933 Referring Provider: [name]  Encounter Date: 01/03/2017      PT End of Session - 01/04/17 1415    Visit Number 19  G code due 19th visit   Number of Visits 33   Date for PT Re-Evaluation 01/03/17   Authorization Type Medicare; G-Code   PT Start Time 5997   PT Stop Time 1620   PT Time Calculation (min) 43 min   Activity Tolerance Patient tolerated treatment well   Behavior During Therapy [facility] for tasks assessed/performed      Past Medical History:  Diagnosis Date  . Anemia    h/o hemorrhoidal bleeding and blood transfusion  . Cardiomegaly   . Chronic diastolic CHF (congestive heart failure) ([place])   . Depression   . Diverticulosis   . DOE (dyspnea on exertion)   . Esophageal reflux   . GERD (gastroesophageal reflux disease)   . Hypothyroidism   . Insomnia   . LBP (low back pain)   . OAB (overactive bladder)   . Obesity   . OSA (obstructive sleep apnea)   . Osteoarthritis   . Rheumatoid arthritis(714.0)   . Shoulder pain, bilateral   . Unspecified essential hypertension   . Venous insufficiency     Past Surgical History:  Procedure Laterality Date  . APPENDECTOMY  1953  . bladder abduction-1996  1996  . breast biopsy Right 1980  . [place]  . COSMETIC SURGERY  1996  . CYSTOCELE REPAIR    . FLEXIBLE SIGMOIDOSCOPY N/A 04/10/2015   Procedure: FLEXIBLE SIGMOIDOSCOPY;  Surgeon: Arta Silence, MD;  Location: [facility] ENDOSCOPY;  Service: Endoscopy;  Laterality: N/A;  . knee arthroscopy Right 1996, 2010  . KNEE ARTHROSCOPY W/ AUTOGENOUS CARTILAGE IMPLANTATION (ACI) PROCEDURE Left 1994, 1995  . SHOULDER SURGERY  1990  . TOTAL ABDOMINAL HYSTERECTOMY   1972  . VESICOVAGINAL FISTULA CLOSURE W/ TAH    . WRIST SURGERY  1967    There were no vitals filed for this visit.      Subjective Assessment - 01/03/17 1541    Subjective Making plans for her trip to [place]. Concerned re: boarding train with her luggage. Overall knows she has improved her balance and walking.    Patient Stated Goals Be able to walk up and down a few steps; be able to go downtown to the events/galleries (uneven pavement)   Currently in Pain? Yes   Pain Score 5    Pain Location Knee   Pain Orientation Left   Pain Descriptors / Indicators Sore   Pain Type Chronic pain   Pain Onset More than a month ago   Pain Frequency Constant                         [facility] Adult PT Treatment/Exercise - 01/03/17 1545      Ambulation/Gait   Ambulation/Gait Assistance 6: Modified independent (Device/Increase time)   Ambulation Distance (Feet) 100 Feet  x 3   Assistive device Straight cane   Gait Pattern Step-through pattern;Decreased step length - right;Decreased trunk rotation;Decreased step length - left   Ambulation Surface Level     Standardized Balance Assessment   Standardized Balance Assessment Timed Up and Go Test;Dynamic Gait  Index     Dynamic Gait Index   Level Surface Normal   Change in Gait Speed Normal   Gait with Horizontal Head Turns Normal   Gait with Vertical Head Turns Normal   Gait and Pivot Turn Normal   Step Over Obstacle Mild Impairment   Step Around Obstacles Normal   Steps Mild Impairment   Total Score 22     Timed Up and Go Test   TUG Normal TUG   Normal TUG (seconds) 12.41                  PT Short Term Goals - 12/01/16 1746      PT SHORT TERM GOAL #1   Title Patient will be independent with updated HEP    Time 4   Period Weeks   Status Achieved     PT SHORT TERM GOAL #2   Title Patient will improve DGI score by 2 points to indicate a lesser fall risk (compared to last goal check).    Baseline 12/01/16 22/24    Time 4   Period Weeks   Status Achieved     PT SHORT TERM GOAL #3   Title Patient will be able to maintain SLS > 3 seconds without UE support to demonstrate a lesser fall risk.    Baseline 12/01/16 <1 sec without UE support; with single finger support x 30 sec   Time 4   Period Weeks   Status Not Met     PT SHORT TERM GOAL #4   Title Patient will ambulate 500 ft with LRAD modified independent on unlevel, outdoor paved surfaces including negotiating single step/curb.    Time 4   Period Weeks   Status Achieved           PT Long Term Goals - 01/04/17 1418      PT LONG TERM GOAL #1   Title Patient will be independent with updated HEP    Time 8   Period Weeks   Status Achieved     PT LONG TERM GOAL #4   Title Patient will ambulate 500 ft with LRAD modified independent on unlevel, outdoor paved surfaces including negotiating single step/curb.    Baseline 7/24 simulated indoors with cane (see note) supervision with cane ; 12/01/16 met   Time 8   Period Weeks   Status Achieved     PT LONG TERM GOAL #5   Title Patient will be able to verbalize her plan for continued activity upon discharge from PT.    Baseline 9/25 will continue Tai-Chi class 2x/week (she recently started at [facility])   Time 8   Period Weeks   Status Achieved     PT LONG TERM GOAL #6   Title Patient will be modified independent walking over unlevel ground outdoors (grass, mulch) with cane x 25 ft to simulate walking in her back yard.    Baseline 8/23  with supervision; 9/25 reports she has done with her cane in her backyard   Time 8   Period Weeks   Status Achieved     PT LONG TERM GOAL #7   Title Patient will be modified independent with cane walking over paved outdoor surfaces x 1000 ft to simulate walking to downtown events.   Baseline 8/23 700 ft 9/25 unable to further assess due to recent knee injury and pain--pt deferred as she is preparing for overseas travel and does not want to make knee  hurt more; she reports walking  at least this far when she goes to downtown areas   Time 8   Period Weeks   Status Partially Met               Plan - January 18, 2017 1422    Clinical Impression Statement Session focused on re-assessment of LTGs with pt meeting 4 of 5 goals and 5th goal was partially met (made progress towards goal, goal level not achieved). Patient is very pleased with her progress. Has returned to going to downtown restaurants and her favorite knitting store. She has traveled in [place] through airports (using shuttles and wheelchair) and is excited to go to [place] next week. Patient is prepared for discharge from PT.    Rehab Potential Good   Clinical Impairments Affecting Rehab Potential arthritis pain; fear of falling   Consulted and Agree with Plan of Care Patient      Patient will benefit from skilled therapeutic intervention in order to improve the following deficits and impairments:     Visit Diagnosis: Unsteadiness on feet - Plan: PT PLAN OF CARE CERT/RE-CERT       G-Codes - 01/18/2017 1425    Functional Assessment Tool Used (Outpatient Only) DGI 22/24:  TUG score 12.41 secs   Functional Limitation Mobility: Walking and moving around   Mobility: Walking and Moving Around Goal Status [phone]) At least 1 percent but less than 20 percent impaired, limited or restricted   Mobility: Walking and Moving Around Discharge Status [phone]) At least 1 percent but less than 20 percent impaired, limited or restricted      Problem List Patient Active Problem List   Diagnosis Date Noted  . Aortic stenosis, mild 01/15/2016  . Bilateral carotid bruits 05/11/2015  . Hyponatremia 04/19/2015  . Chronic anemia 04/19/2015  . Panic attack 04/19/2015  . Constipation 04/19/2015  . Hyperkalemia 04/17/2015  . Influenza A 04/16/2015  . Proximal humerus fracture   . Left patella fracture   . Benign essential HTN   . Esophageal reflux   . Chronic diastolic heart failure ([place])   .  Thyroid activity decreased   . Lower GI bleed 04/08/2015  . Hematochezia   . Rectal bleeding 04/03/2015  . Patellar fracture 03/06/2015  . Left humeral fracture 11/04/2013  . Rib fracture 11/04/2013  . Hypothyroid 11/04/2013  . Hypertension 11/04/2013  . OSA (obstructive sleep apnea) 11/13/2008  . BOOP (bronchiolitis obliterans with organizing pneumonia) ([place]) 08/25/2008   PHYSICAL THERAPY DISCHARGE SUMMARY  Visits from Start of Care: 19  Current functional level related to goals / functional outcomes: Modified independent with cane over level and unlevel surfaces (indoor/outdoor).    Remaining deficits: Scores are above level for increased fall risk--now at normal risk for falls.   Education / Equipment: HEP  Plan: Patient agrees to discharge.  Patient goals were partially met. Patient is being discharged due to being pleased with the current functional level.  ?????       Rexanne Mano, PT 2017/01/18, 2:29 PM  [facility] [address] [place], [place], [address] Phone: [phone]   Fax:  [phone]  Name: Lisa Mcclure MRN: [id number] Date of Birth: 1933/03/06

## 2017-01-05 ENCOUNTER — Ambulatory Visit: Payer: Medicare Other | Admitting: Physical Therapy

## 2017-01-23 DIAGNOSIS — H02132 Senile ectropion of right lower eyelid: Secondary | ICD-10-CM | POA: Diagnosis not present

## 2017-01-23 DIAGNOSIS — H02135 Senile ectropion of left lower eyelid: Secondary | ICD-10-CM | POA: Diagnosis not present

## 2017-01-23 DIAGNOSIS — R234 Changes in skin texture: Secondary | ICD-10-CM | POA: Diagnosis not present

## 2017-03-01 DIAGNOSIS — S2249XA Multiple fractures of ribs, unspecified side, initial encounter for closed fracture: Secondary | ICD-10-CM | POA: Diagnosis not present

## 2017-03-01 DIAGNOSIS — R0781 Pleurodynia: Secondary | ICD-10-CM | POA: Diagnosis not present

## 2017-03-01 DIAGNOSIS — J90 Pleural effusion, not elsewhere classified: Secondary | ICD-10-CM | POA: Diagnosis not present

## 2017-03-01 DIAGNOSIS — M810 Age-related osteoporosis without current pathological fracture: Secondary | ICD-10-CM | POA: Diagnosis not present

## 2017-03-01 DIAGNOSIS — Z6838 Body mass index (BMI) 38.0-38.9, adult: Secondary | ICD-10-CM | POA: Diagnosis not present

## 2017-03-07 DIAGNOSIS — Z6838 Body mass index (BMI) 38.0-38.9, adult: Secondary | ICD-10-CM | POA: Diagnosis not present

## 2017-03-07 DIAGNOSIS — R0602 Shortness of breath: Secondary | ICD-10-CM | POA: Diagnosis not present

## 2017-03-07 DIAGNOSIS — M810 Age-related osteoporosis without current pathological fracture: Secondary | ICD-10-CM | POA: Diagnosis not present

## 2017-03-07 DIAGNOSIS — R350 Frequency of micturition: Secondary | ICD-10-CM | POA: Diagnosis not present

## 2017-03-07 DIAGNOSIS — N3281 Overactive bladder: Secondary | ICD-10-CM | POA: Diagnosis not present

## 2017-03-07 DIAGNOSIS — I10 Essential (primary) hypertension: Secondary | ICD-10-CM | POA: Diagnosis not present

## 2017-03-07 DIAGNOSIS — G4733 Obstructive sleep apnea (adult) (pediatric): Secondary | ICD-10-CM | POA: Diagnosis not present

## 2017-03-07 DIAGNOSIS — J90 Pleural effusion, not elsewhere classified: Secondary | ICD-10-CM | POA: Diagnosis not present

## 2017-03-23 DIAGNOSIS — R82998 Other abnormal findings in urine: Secondary | ICD-10-CM | POA: Diagnosis not present

## 2017-03-23 DIAGNOSIS — M810 Age-related osteoporosis without current pathological fracture: Secondary | ICD-10-CM | POA: Diagnosis not present

## 2017-03-23 DIAGNOSIS — I10 Essential (primary) hypertension: Secondary | ICD-10-CM | POA: Diagnosis not present

## 2017-03-23 DIAGNOSIS — E038 Other specified hypothyroidism: Secondary | ICD-10-CM | POA: Diagnosis not present

## 2017-03-31 DIAGNOSIS — E038 Other specified hypothyroidism: Secondary | ICD-10-CM | POA: Diagnosis not present

## 2017-03-31 DIAGNOSIS — I10 Essential (primary) hypertension: Secondary | ICD-10-CM | POA: Diagnosis not present

## 2017-03-31 DIAGNOSIS — R0602 Shortness of breath: Secondary | ICD-10-CM | POA: Diagnosis not present

## 2017-03-31 DIAGNOSIS — I5032 Chronic diastolic (congestive) heart failure: Secondary | ICD-10-CM | POA: Diagnosis not present

## 2017-03-31 DIAGNOSIS — Z6837 Body mass index (BMI) 37.0-37.9, adult: Secondary | ICD-10-CM | POA: Diagnosis not present

## 2017-03-31 DIAGNOSIS — R2689 Other abnormalities of gait and mobility: Secondary | ICD-10-CM | POA: Diagnosis not present

## 2017-03-31 DIAGNOSIS — S2249XA Multiple fractures of ribs, unspecified side, initial encounter for closed fracture: Secondary | ICD-10-CM | POA: Diagnosis not present

## 2017-03-31 DIAGNOSIS — M810 Age-related osteoporosis without current pathological fracture: Secondary | ICD-10-CM | POA: Diagnosis not present

## 2017-03-31 DIAGNOSIS — N3281 Overactive bladder: Secondary | ICD-10-CM | POA: Diagnosis not present

## 2017-03-31 DIAGNOSIS — Z0000 Encounter for general adult medical examination without abnormal findings: Secondary | ICD-10-CM | POA: Diagnosis not present

## 2017-03-31 DIAGNOSIS — D6489 Other specified anemias: Secondary | ICD-10-CM | POA: Diagnosis not present

## 2017-03-31 DIAGNOSIS — F4321 Adjustment disorder with depressed mood: Secondary | ICD-10-CM | POA: Diagnosis not present

## 2017-04-07 ENCOUNTER — Other Ambulatory Visit (HOSPITAL_COMMUNITY): Payer: Self-pay | Admitting: Ophthalmology

## 2017-04-07 DIAGNOSIS — H401112 Primary open-angle glaucoma, right eye, moderate stage: Secondary | ICD-10-CM | POA: Diagnosis not present

## 2017-04-07 DIAGNOSIS — H524 Presbyopia: Secondary | ICD-10-CM | POA: Diagnosis not present

## 2017-04-07 DIAGNOSIS — I7090 Unspecified atherosclerosis: Secondary | ICD-10-CM

## 2017-04-07 DIAGNOSIS — H353132 Nonexudative age-related macular degeneration, bilateral, intermediate dry stage: Secondary | ICD-10-CM | POA: Diagnosis not present

## 2017-04-07 DIAGNOSIS — H349 Unspecified retinal vascular occlusion: Secondary | ICD-10-CM | POA: Diagnosis not present

## 2017-04-13 ENCOUNTER — Ambulatory Visit (HOSPITAL_COMMUNITY)
Admission: RE | Admit: 2017-04-13 | Discharge: 2017-04-13 | Disposition: A | Discharge status: Home / Self Care | Payer: Medicare Other | Source: Ambulatory Visit | Attending: Ophthalmology | Admitting: Ophthalmology

## 2017-04-13 DIAGNOSIS — I7090 Unspecified atherosclerosis: Secondary | ICD-10-CM | POA: Diagnosis not present

## 2017-04-13 NOTE — Progress Notes (Signed)
*  PRELIMINARY RESULTS* Vascular Ultrasound Carotid Duplex (Doppler) has been completed. Findings suggest 1-39% internal carotid artery stenosis bilaterally. Vertebral arteries are patent with antegrade flow.  04/13/2017 11:43 AM Maudry Mayhew, BS, RVT, RDCS, RDMS

## 2017-04-14 ENCOUNTER — Other Ambulatory Visit: Payer: Self-pay | Admitting: Physician Assistant

## 2017-04-14 DIAGNOSIS — I5032 Chronic diastolic (congestive) heart failure: Secondary | ICD-10-CM

## 2017-04-14 DIAGNOSIS — I10 Essential (primary) hypertension: Secondary | ICD-10-CM

## 2017-05-03 DIAGNOSIS — H34231 Retinal artery branch occlusion, right eye: Secondary | ICD-10-CM | POA: Diagnosis not present

## 2017-05-03 DIAGNOSIS — H43813 Vitreous degeneration, bilateral: Secondary | ICD-10-CM | POA: Diagnosis not present

## 2017-05-03 DIAGNOSIS — H353132 Nonexudative age-related macular degeneration, bilateral, intermediate dry stage: Secondary | ICD-10-CM | POA: Diagnosis not present

## 2017-05-08 ENCOUNTER — Other Ambulatory Visit: Payer: Self-pay | Admitting: Cardiology

## 2017-05-08 DIAGNOSIS — I5032 Chronic diastolic (congestive) heart failure: Secondary | ICD-10-CM

## 2017-05-08 DIAGNOSIS — I10 Essential (primary) hypertension: Secondary | ICD-10-CM

## 2017-05-08 MED ORDER — HYDRALAZINE HCL 25 MG PO TABS: 25.0000 mg | ORAL_TABLET | Freq: Three times a day (TID) | ORAL | 0 refills | Status: DC

## 2017-05-09 ENCOUNTER — Encounter (HOSPITAL_COMMUNITY): Payer: Medicare Other

## 2017-05-11 ENCOUNTER — Other Ambulatory Visit (HOSPITAL_COMMUNITY): Payer: Self-pay | Admitting: *Deleted

## 2017-05-12 ENCOUNTER — Inpatient Hospital Stay (HOSPITAL_COMMUNITY): Admission: RE | Admit: 2017-05-12 | Payer: Medicare Other | Source: Ambulatory Visit

## 2017-05-17 DIAGNOSIS — N3281 Overactive bladder: Secondary | ICD-10-CM | POA: Diagnosis not present

## 2017-05-17 DIAGNOSIS — N3946 Mixed incontinence: Secondary | ICD-10-CM | POA: Diagnosis not present

## 2017-05-17 DIAGNOSIS — N9089 Other specified noninflammatory disorders of vulva and perineum: Secondary | ICD-10-CM | POA: Diagnosis not present

## 2017-05-17 DIAGNOSIS — N3642 Intrinsic sphincter deficiency (ISD): Secondary | ICD-10-CM | POA: Diagnosis not present

## 2017-05-17 DIAGNOSIS — R339 Retention of urine, unspecified: Secondary | ICD-10-CM | POA: Insufficient documentation

## 2017-05-19 DIAGNOSIS — H43813 Vitreous degeneration, bilateral: Secondary | ICD-10-CM | POA: Diagnosis not present

## 2017-05-29 ENCOUNTER — Other Ambulatory Visit: Payer: Self-pay

## 2017-05-29 DIAGNOSIS — I10 Essential (primary) hypertension: Secondary | ICD-10-CM

## 2017-05-29 DIAGNOSIS — I5032 Chronic diastolic (congestive) heart failure: Secondary | ICD-10-CM

## 2017-05-29 MED ORDER — HYDRALAZINE HCL 25 MG PO TABS: 25.0000 mg | ORAL_TABLET | Freq: Three times a day (TID) | ORAL | 0 refills | Status: DC

## 2017-06-02 ENCOUNTER — Inpatient Hospital Stay (HOSPITAL_COMMUNITY): Admission: RE | Admit: 2017-06-02 | Payer: Medicare Other | Source: Ambulatory Visit

## 2017-06-16 DIAGNOSIS — H5310 Unspecified subjective visual disturbances: Secondary | ICD-10-CM | POA: Diagnosis not present

## 2017-06-22 ENCOUNTER — Ambulatory Visit (HOSPITAL_COMMUNITY)
Admission: RE | Admit: 2017-06-22 | Discharge: 2017-06-22 | Disposition: A | Discharge status: Home / Self Care | Payer: Medicare Other | Source: Ambulatory Visit | Attending: Internal Medicine | Admitting: Internal Medicine

## 2017-06-22 DIAGNOSIS — H5310 Unspecified subjective visual disturbances: Secondary | ICD-10-CM | POA: Diagnosis not present

## 2017-06-22 MED ORDER — DENOSUMAB 60 MG/ML ~~LOC~~ SOLN
60.0000 mg | Freq: Once | SUBCUTANEOUS | Status: DC
  Filled 2017-06-22: qty 1

## 2017-06-22 NOTE — Progress Notes (Signed)
Pt here today for prolia injection.  She stated she had never had it before and voiced concerns regarding the side effects.  I printed off the AVS with info about prolia so she could read more about it and she voiced more concerns regarding receiving the medication.  I called Dr Shon Baton office and let her speak with [place] and when pt hung up with her she said she has decided not to take it.

## 2017-06-22 NOTE — Discharge Instructions (Signed)
Denosumab injection °What is this medicine? °DENOSUMAB (den oh sue mab) slows bone breakdown. Prolia is used to treat osteoporosis in women after menopause and in men. Xgeva is used to treat a high calcium level due to cancer and to prevent bone fractures and other bone problems caused by multiple myeloma or cancer bone metastases. Xgeva is also used to treat giant cell tumor of the bone. °This medicine may be used for other purposes; ask your health care provider or pharmacist if you have questions. °COMMON BRAND NAME(S): Prolia, XGEVA °What should I tell my health care provider before I take this medicine? °They need to know if you have any of these conditions: °-dental disease °-having surgery or tooth extraction °-infection °-kidney disease °-low levels of calcium or Vitamin D in the blood °-malnutrition °-on hemodialysis °-skin conditions or sensitivity °-thyroid or parathyroid disease °-an unusual reaction to denosumab, other medicines, foods, dyes, or preservatives °-pregnant or trying to get pregnant °-breast-feeding °How should I use this medicine? °This medicine is for injection under the skin. It is given by a health care professional in a hospital or clinic setting. °If you are getting Prolia, a special MedGuide will be given to you by the pharmacist with each prescription and refill. Be sure to read this information carefully each time. °For Prolia, talk to your pediatrician regarding the use of this medicine in children. Special care may be needed. For Xgeva, talk to your pediatrician regarding the use of this medicine in children. While this drug may be prescribed for children as young as 13 years for selected conditions, precautions do apply. °Overdosage: If you think you have taken too much of this medicine contact a poison control center or emergency room at once. °NOTE: This medicine is only for you. Do not share this medicine with others. °What if I miss a dose? °It is important not to miss your  dose. Call your doctor or health care professional if you are unable to keep an appointment. °What may interact with this medicine? °Do not take this medicine with any of the following medications: °-other medicines containing denosumab °This medicine may also interact with the following medications: °-medicines that lower your chance of fighting infection °-steroid medicines like prednisone or cortisone °This list may not describe all possible interactions. Give your health care provider a list of all the medicines, herbs, non-prescription drugs, or dietary supplements you use. Also tell them if you smoke, drink alcohol, or use illegal drugs. Some items may interact with your medicine. °What should I watch for while using this medicine? °Visit your doctor or health care professional for regular checks on your progress. Your doctor or health care professional may order blood tests and other tests to see how you are doing. °Call your doctor or health care professional for advice if you get a fever, chills or sore throat, or other symptoms of a cold or flu. Do not treat yourself. This drug may decrease your body's ability to fight infection. Try to avoid being around people who are sick. °You should make sure you get enough calcium and vitamin D while you are taking this medicine, unless your doctor tells you not to. Discuss the foods you eat and the vitamins you take with your health care professional. °See your dentist regularly. Brush and floss your teeth as directed. Before you have any dental work done, tell your dentist you are receiving this medicine. °Do not become pregnant while taking this medicine or for 5 months after stopping   it. Talk with your doctor or health care professional about your birth control options while taking this medicine. Women should inform their doctor if they wish to become pregnant or think they might be pregnant. There is a potential for serious side effects to an unborn child. Talk  to your health care professional or pharmacist for more information. What side effects may I notice from receiving this medicine? Side effects that you should report to your doctor or health care professional as soon as possible: -allergic reactions like skin rash, itching or hives, swelling of the face, lips, or tongue -bone pain -breathing problems -dizziness -jaw pain, especially after dental work -redness, blistering, peeling of the skin -signs and symptoms of infection like fever or chills; cough; sore throat; pain or trouble passing urine -signs of low calcium like fast heartbeat, muscle cramps or muscle pain; pain, tingling, numbness in the hands or feet; seizures -unusual bleeding or bruising -unusually weak or tired Side effects that usually do not require medical attention (report to your doctor or health care professional if they continue or are bothersome): -constipation -diarrhea -headache -joint pain -loss of appetite -muscle pain -runny nose -tiredness -upset stomach This list may not describe all possible side effects. Call your doctor for medical advice about side effects. You may report side effects to FDA at 1-800-FDA-1088. Where should I keep my medicine? This medicine is only given in a clinic, doctor's office, or other health care setting and will not be stored at home. NOTE: This sheet is a summary. It may not cover all possible information. If you have questions about this medicine, talk to your doctor, pharmacist, or health care provider.  2018 Elsevier/Gold Standard (2016-04-19 19:17:21)

## 2017-06-23 DIAGNOSIS — M19011 Primary osteoarthritis, right shoulder: Secondary | ICD-10-CM | POA: Diagnosis not present

## 2017-06-27 ENCOUNTER — Other Ambulatory Visit: Payer: Self-pay | Admitting: Orthopedic Surgery

## 2017-06-27 DIAGNOSIS — M19011 Primary osteoarthritis, right shoulder: Secondary | ICD-10-CM

## 2017-06-29 DIAGNOSIS — H5310 Unspecified subjective visual disturbances: Secondary | ICD-10-CM | POA: Diagnosis not present

## 2017-07-06 ENCOUNTER — Other Ambulatory Visit: Payer: Medicare Other

## 2017-07-12 ENCOUNTER — Other Ambulatory Visit: Payer: Medicare Other

## 2017-07-21 ENCOUNTER — Other Ambulatory Visit: Payer: Self-pay | Admitting: Physician Assistant

## 2017-07-21 DIAGNOSIS — I5032 Chronic diastolic (congestive) heart failure: Secondary | ICD-10-CM

## 2017-07-21 DIAGNOSIS — I10 Essential (primary) hypertension: Secondary | ICD-10-CM

## 2017-07-21 NOTE — Telephone Encounter (Signed)
Pt is on has not been seen in over a year and is on 3rd and final attempt to refill Rx. Has not schedule an appointment. Okay to refill? Please advise.

## 2017-08-08 DIAGNOSIS — E038 Other specified hypothyroidism: Secondary | ICD-10-CM | POA: Diagnosis not present

## 2017-08-08 DIAGNOSIS — D649 Anemia, unspecified: Secondary | ICD-10-CM | POA: Diagnosis not present

## 2017-08-08 DIAGNOSIS — R0602 Shortness of breath: Secondary | ICD-10-CM | POA: Diagnosis not present

## 2017-08-08 DIAGNOSIS — M25561 Pain in right knee: Secondary | ICD-10-CM | POA: Diagnosis not present

## 2017-08-08 DIAGNOSIS — I10 Essential (primary) hypertension: Secondary | ICD-10-CM | POA: Diagnosis not present

## 2017-08-08 DIAGNOSIS — Z6836 Body mass index (BMI) 36.0-36.9, adult: Secondary | ICD-10-CM | POA: Diagnosis not present

## 2017-08-08 DIAGNOSIS — M25562 Pain in left knee: Secondary | ICD-10-CM | POA: Diagnosis not present

## 2017-08-15 DIAGNOSIS — H2512 Age-related nuclear cataract, left eye: Secondary | ICD-10-CM | POA: Diagnosis not present

## 2017-08-15 DIAGNOSIS — H401131 Primary open-angle glaucoma, bilateral, mild stage: Secondary | ICD-10-CM | POA: Diagnosis not present

## 2017-08-31 DIAGNOSIS — I350 Nonrheumatic aortic (valve) stenosis: Secondary | ICD-10-CM | POA: Diagnosis not present

## 2017-08-31 DIAGNOSIS — I10 Essential (primary) hypertension: Secondary | ICD-10-CM | POA: Diagnosis not present

## 2017-08-31 DIAGNOSIS — R0609 Other forms of dyspnea: Secondary | ICD-10-CM | POA: Diagnosis not present

## 2017-08-31 DIAGNOSIS — I5032 Chronic diastolic (congestive) heart failure: Secondary | ICD-10-CM | POA: Diagnosis not present

## 2017-09-08 DIAGNOSIS — G4733 Obstructive sleep apnea (adult) (pediatric): Secondary | ICD-10-CM | POA: Diagnosis not present

## 2017-09-11 DIAGNOSIS — G4733 Obstructive sleep apnea (adult) (pediatric): Secondary | ICD-10-CM | POA: Diagnosis not present

## 2017-09-19 DIAGNOSIS — I10 Essential (primary) hypertension: Secondary | ICD-10-CM | POA: Diagnosis not present

## 2017-09-19 DIAGNOSIS — R0602 Shortness of breath: Secondary | ICD-10-CM | POA: Diagnosis not present

## 2017-09-19 DIAGNOSIS — Z6837 Body mass index (BMI) 37.0-37.9, adult: Secondary | ICD-10-CM | POA: Diagnosis not present

## 2017-09-19 DIAGNOSIS — R2689 Other abnormalities of gait and mobility: Secondary | ICD-10-CM | POA: Diagnosis not present

## 2017-09-19 DIAGNOSIS — E038 Other specified hypothyroidism: Secondary | ICD-10-CM | POA: Diagnosis not present

## 2017-09-19 DIAGNOSIS — G4734 Idiopathic sleep related nonobstructive alveolar hypoventilation: Secondary | ICD-10-CM | POA: Diagnosis not present

## 2017-09-27 DIAGNOSIS — I10 Essential (primary) hypertension: Secondary | ICD-10-CM | POA: Diagnosis not present

## 2017-09-27 DIAGNOSIS — R011 Cardiac murmur, unspecified: Secondary | ICD-10-CM | POA: Diagnosis not present

## 2017-10-11 DIAGNOSIS — R0609 Other forms of dyspnea: Secondary | ICD-10-CM | POA: Diagnosis not present

## 2017-10-11 DIAGNOSIS — M1990 Unspecified osteoarthritis, unspecified site: Secondary | ICD-10-CM | POA: Diagnosis not present

## 2017-10-11 DIAGNOSIS — E559 Vitamin D deficiency, unspecified: Secondary | ICD-10-CM | POA: Diagnosis not present

## 2017-10-11 DIAGNOSIS — M150 Primary generalized (osteo)arthritis: Secondary | ICD-10-CM | POA: Diagnosis not present

## 2017-10-11 DIAGNOSIS — I350 Nonrheumatic aortic (valve) stenosis: Secondary | ICD-10-CM | POA: Diagnosis not present

## 2017-10-11 DIAGNOSIS — I2510 Atherosclerotic heart disease of native coronary artery without angina pectoris: Secondary | ICD-10-CM | POA: Diagnosis not present

## 2017-10-11 DIAGNOSIS — E119 Type 2 diabetes mellitus without complications: Secondary | ICD-10-CM | POA: Diagnosis not present

## 2017-10-11 DIAGNOSIS — I10 Essential (primary) hypertension: Secondary | ICD-10-CM | POA: Diagnosis not present

## 2017-10-31 DIAGNOSIS — H43813 Vitreous degeneration, bilateral: Secondary | ICD-10-CM | POA: Diagnosis not present

## 2017-10-31 DIAGNOSIS — H353132 Nonexudative age-related macular degeneration, bilateral, intermediate dry stage: Secondary | ICD-10-CM | POA: Diagnosis not present

## 2017-12-08 DIAGNOSIS — R0609 Other forms of dyspnea: Secondary | ICD-10-CM | POA: Diagnosis not present

## 2017-12-08 DIAGNOSIS — R0602 Shortness of breath: Secondary | ICD-10-CM | POA: Diagnosis not present

## 2017-12-08 DIAGNOSIS — I10 Essential (primary) hypertension: Secondary | ICD-10-CM | POA: Diagnosis not present

## 2018-01-03 DIAGNOSIS — I5032 Chronic diastolic (congestive) heart failure: Secondary | ICD-10-CM | POA: Diagnosis not present

## 2018-01-03 DIAGNOSIS — E7800 Pure hypercholesterolemia, unspecified: Secondary | ICD-10-CM | POA: Diagnosis not present

## 2018-01-10 DIAGNOSIS — I10 Essential (primary) hypertension: Secondary | ICD-10-CM | POA: Diagnosis not present

## 2018-01-10 DIAGNOSIS — R0609 Other forms of dyspnea: Secondary | ICD-10-CM | POA: Diagnosis not present

## 2018-01-10 DIAGNOSIS — I350 Nonrheumatic aortic (valve) stenosis: Secondary | ICD-10-CM | POA: Diagnosis not present

## 2018-01-10 DIAGNOSIS — I2510 Atherosclerotic heart disease of native coronary artery without angina pectoris: Secondary | ICD-10-CM | POA: Diagnosis not present

## 2018-01-13 DIAGNOSIS — Z23 Encounter for immunization: Secondary | ICD-10-CM | POA: Diagnosis not present

## 2018-01-18 DIAGNOSIS — H25812 Combined forms of age-related cataract, left eye: Secondary | ICD-10-CM | POA: Diagnosis not present

## 2018-01-18 DIAGNOSIS — H2512 Age-related nuclear cataract, left eye: Secondary | ICD-10-CM | POA: Diagnosis not present

## 2018-01-26 DIAGNOSIS — H353112 Nonexudative age-related macular degeneration, right eye, intermediate dry stage: Secondary | ICD-10-CM | POA: Diagnosis not present

## 2018-01-30 DIAGNOSIS — Z23 Encounter for immunization: Secondary | ICD-10-CM | POA: Diagnosis not present

## 2018-01-30 DIAGNOSIS — L821 Other seborrheic keratosis: Secondary | ICD-10-CM | POA: Diagnosis not present

## 2018-01-30 DIAGNOSIS — D225 Melanocytic nevi of trunk: Secondary | ICD-10-CM | POA: Diagnosis not present

## 2018-01-30 DIAGNOSIS — L570 Actinic keratosis: Secondary | ICD-10-CM | POA: Diagnosis not present

## 2018-01-30 DIAGNOSIS — D235 Other benign neoplasm of skin of trunk: Secondary | ICD-10-CM | POA: Diagnosis not present

## 2018-02-15 DIAGNOSIS — Z6837 Body mass index (BMI) 37.0-37.9, adult: Secondary | ICD-10-CM | POA: Diagnosis not present

## 2018-02-15 DIAGNOSIS — Z1389 Encounter for screening for other disorder: Secondary | ICD-10-CM | POA: Diagnosis not present

## 2018-02-15 DIAGNOSIS — R2689 Other abnormalities of gait and mobility: Secondary | ICD-10-CM | POA: Diagnosis not present

## 2018-02-15 DIAGNOSIS — I10 Essential (primary) hypertension: Secondary | ICD-10-CM | POA: Diagnosis not present

## 2018-02-15 DIAGNOSIS — E038 Other specified hypothyroidism: Secondary | ICD-10-CM | POA: Diagnosis not present

## 2018-02-15 DIAGNOSIS — N3281 Overactive bladder: Secondary | ICD-10-CM | POA: Diagnosis not present

## 2018-02-15 DIAGNOSIS — I5032 Chronic diastolic (congestive) heart failure: Secondary | ICD-10-CM | POA: Diagnosis not present

## 2018-02-16 DIAGNOSIS — H353132 Nonexudative age-related macular degeneration, bilateral, intermediate dry stage: Secondary | ICD-10-CM | POA: Diagnosis not present

## 2018-02-22 DIAGNOSIS — M19011 Primary osteoarthritis, right shoulder: Secondary | ICD-10-CM | POA: Diagnosis not present

## 2018-02-22 DIAGNOSIS — M19012 Primary osteoarthritis, left shoulder: Secondary | ICD-10-CM | POA: Diagnosis not present

## 2018-03-16 DIAGNOSIS — I5032 Chronic diastolic (congestive) heart failure: Secondary | ICD-10-CM | POA: Diagnosis not present

## 2018-03-16 DIAGNOSIS — R0602 Shortness of breath: Secondary | ICD-10-CM | POA: Diagnosis not present

## 2018-03-16 DIAGNOSIS — J181 Lobar pneumonia, unspecified organism: Secondary | ICD-10-CM | POA: Diagnosis not present

## 2018-03-16 DIAGNOSIS — Z6836 Body mass index (BMI) 36.0-36.9, adult: Secondary | ICD-10-CM | POA: Diagnosis not present

## 2018-03-16 DIAGNOSIS — I10 Essential (primary) hypertension: Secondary | ICD-10-CM | POA: Diagnosis not present

## 2018-03-28 DIAGNOSIS — H353131 Nonexudative age-related macular degeneration, bilateral, early dry stage: Secondary | ICD-10-CM | POA: Diagnosis not present

## 2018-03-28 DIAGNOSIS — H43813 Vitreous degeneration, bilateral: Secondary | ICD-10-CM | POA: Diagnosis not present

## 2018-04-16 DIAGNOSIS — M810 Age-related osteoporosis without current pathological fracture: Secondary | ICD-10-CM | POA: Diagnosis not present

## 2018-04-16 DIAGNOSIS — R82998 Other abnormal findings in urine: Secondary | ICD-10-CM | POA: Diagnosis not present

## 2018-04-16 DIAGNOSIS — E038 Other specified hypothyroidism: Secondary | ICD-10-CM | POA: Diagnosis not present

## 2018-04-16 DIAGNOSIS — I10 Essential (primary) hypertension: Secondary | ICD-10-CM | POA: Diagnosis not present

## 2018-04-16 DIAGNOSIS — E039 Hypothyroidism, unspecified: Secondary | ICD-10-CM | POA: Diagnosis not present

## 2018-04-20 DIAGNOSIS — I872 Venous insufficiency (chronic) (peripheral): Secondary | ICD-10-CM | POA: Diagnosis not present

## 2018-04-20 DIAGNOSIS — R011 Cardiac murmur, unspecified: Secondary | ICD-10-CM | POA: Diagnosis not present

## 2018-04-20 DIAGNOSIS — R2689 Other abnormalities of gait and mobility: Secondary | ICD-10-CM | POA: Diagnosis not present

## 2018-04-20 DIAGNOSIS — Z0000 Encounter for general adult medical examination without abnormal findings: Secondary | ICD-10-CM | POA: Diagnosis not present

## 2018-04-20 DIAGNOSIS — N3281 Overactive bladder: Secondary | ICD-10-CM | POA: Diagnosis not present

## 2018-04-20 DIAGNOSIS — I5032 Chronic diastolic (congestive) heart failure: Secondary | ICD-10-CM | POA: Diagnosis not present

## 2018-04-20 DIAGNOSIS — Z6837 Body mass index (BMI) 37.0-37.9, adult: Secondary | ICD-10-CM | POA: Diagnosis not present

## 2018-04-20 DIAGNOSIS — E668 Other obesity: Secondary | ICD-10-CM | POA: Diagnosis not present

## 2018-04-20 DIAGNOSIS — I10 Essential (primary) hypertension: Secondary | ICD-10-CM | POA: Diagnosis not present

## 2018-04-20 DIAGNOSIS — G4733 Obstructive sleep apnea (adult) (pediatric): Secondary | ICD-10-CM | POA: Diagnosis not present

## 2018-04-20 DIAGNOSIS — M810 Age-related osteoporosis without current pathological fracture: Secondary | ICD-10-CM | POA: Diagnosis not present

## 2018-04-20 DIAGNOSIS — E038 Other specified hypothyroidism: Secondary | ICD-10-CM | POA: Diagnosis not present

## 2018-04-24 DIAGNOSIS — Z1212 Encounter for screening for malignant neoplasm of rectum: Secondary | ICD-10-CM | POA: Diagnosis not present

## 2018-05-16 ENCOUNTER — Ambulatory Visit (INDEPENDENT_AMBULATORY_CARE_PROVIDER_SITE_OTHER): Payer: Medicare Other | Admitting: Sports Medicine

## 2018-05-16 ENCOUNTER — Ambulatory Visit: Payer: Self-pay

## 2018-05-16 ENCOUNTER — Encounter: Payer: Self-pay | Admitting: Sports Medicine

## 2018-05-16 VITALS — BP 126/78 | HR 64 | Ht 63.5 in | Wt 212.0 lb

## 2018-05-16 DIAGNOSIS — M19011 Primary osteoarthritis, right shoulder: Secondary | ICD-10-CM

## 2018-05-16 DIAGNOSIS — M24611 Ankylosis, right shoulder: Secondary | ICD-10-CM | POA: Diagnosis not present

## 2018-05-16 DIAGNOSIS — M19019 Primary osteoarthritis, unspecified shoulder: Secondary | ICD-10-CM | POA: Insufficient documentation

## 2018-05-16 DIAGNOSIS — S42201S Unspecified fracture of upper end of right humerus, sequela: Secondary | ICD-10-CM | POA: Diagnosis not present

## 2018-05-16 NOTE — Assessment & Plan Note (Signed)
Multifactorial shoulder pain.  Ultimately given the degenerative changes she could be considered for a total shoulder replacement however given her other comorbidities she is not an ideal candidate. Ultrasound-guided intra-articular injection discussed today. Avoidance of exacerbating activities. Follow-up for repeat injections in 10 weeks

## 2018-05-16 NOTE — Patient Instructions (Addendum)
You had an injection today.  Things to be aware of after injection are listed below: . You may experience no significant improvement or even a slight worsening in your symptoms during the first 24 to 48 hours.  After that we expect your symptoms to improve gradually over the next 2 weeks for the medicine to have its maximal effect.  You should continue to have improvement out to 6 weeks after your injection. . Dr. Paulla Fore recommends icing the site of the injection for 20 minutes  1-2 times the day of your injection . You may shower but no swimming, tub bath or Jacuzzi for 24 hours. . If your bandage falls off this does not need to be replaced.  It is appropriate to remove the bandage after 4 hours. . You may resume light activities as tolerated unless otherwise directed per Dr. Paulla Fore during your visit  POSSIBLE STEROID SIDE EFFECTS:  Side effects from injectable steroids tend to be less than when taken orally however you may experience some of the symptoms listed below.  If experienced these should only last for a short period of time. Change in menstrual flow  Edema (swelling)  Increased appetite Skin flushing (redness)  Skin rash/acne  Thrush (oral) Yeast vaginitis    Increased sweating  Depression Increased blood glucose levels Cramping and leg/calf  Euphoria (feeling happy)  POSSIBLE PROCEDURE SIDE EFFECTS: The side effects of the injection are usually fairly minimal however if you may experience some of the following side effects that are usually self-limited and will is off on their own.  If you are concerned please feel free to call the office with questions:  Increased numbness or tingling  Nausea or vomiting  Swelling or bruising at the injection site   Please call our office if if you experience any of the following symptoms over the next week as these can be signs of infection:   Fever greater than 100.76F  Significant swelling at the injection site  Significant redness or drainage  from the injection site  If after 2 weeks you are continuing to have worsening symptoms please call our office to discuss what the next appropriate actions should be including the potential for a return office visit or other diagnostic testing.   Please perform the exercise program that we have prepared for you and gone over in detail on a daily basis.  In addition to the handout you were provided you can access your program through: [email or web address]   Your unique program code is:  [id number]

## 2018-05-16 NOTE — Progress Notes (Signed)
Lisa Mcclure. Lisa Mcclure, [place] [facility] [place]  Lisa Mcclure - 85 y.o. female MRN [id number]  Date of birth: 06/22/1932  Visit Date: May 16, 2018  PCP: [name], MD   Referred by: [name], MD  SUBJECTIVE:  Chief Complaint  Patient presents with  . Right Shoulder - Initial Assessment    XR R shoulder 11/04/2013. MRI C-spine 08/21/2013. Has seen Dr. Onnie Graham for this in the past.     HPI: Patient presents with chronic ongoing right greater than left shoulder pain.  She has had ongoing issues with time and has been followed by Dr. Onnie Graham.  She has been told that her joint has essentially fused.  There is a possible procedure that Dr. Onnie Graham is contemplating but she is not interested in surgical intervention is at this time given the fact that she lives alone and is a widow.  She has undergone intra-articular injections in the past with mild improvement.  She is tried physical therapy as well as other pharmacologic therapy including NSAIDs but she is now unable to take NSAIDs due to her comorbidities.  REVIEW OF SYSTEMS: Denies fevers, chills, recent weight gain or weight loss.  No night sweats. No significant nighttime awakenings due to this issue.  HISTORY:  Prior history reviewed and updated per electronic medical record.  Patient Active Problem List   Diagnosis Date Noted  . OA (osteoarthritis) of shoulder 05/16/2018    Bilateral severe osteoarthritis based on x-rays from 2017.   Marland Kitchen Aortic stenosis, mild 01/15/2016  . Bilateral carotid bruits 05/11/2015  . Hyponatremia 04/19/2015  . Chronic anemia 04/19/2015  . Panic attack 04/19/2015  . Constipation 04/19/2015  . Hyperkalemia 04/17/2015  . Influenza A 04/16/2015  . Proximal humerus fracture     History of bilateral proximal humerus fractures with concomitant underlying severe osteoarthritis of bilateral shoulders.   Latest x-rays obtained in 2017.     Previously followed by Dr. supple.   . Left patella fracture   . Benign essential HTN   . Esophageal reflux   . Chronic diastolic heart failure ([place])   . Thyroid activity decreased   . Lower GI bleed 04/08/2015  . Hematochezia   . Rectal bleeding 04/03/2015  . Patellar fracture 03/06/2015  . Left humeral fracture 11/04/2013    04/08/2015 XR L shoulder IMPRESSION: Evidence of healing left humeral neck fracture. Fracture line remains evident.  03/07/2015 CT L shoulder IMPRESSION: Acute fracture of the proximal humerus shows mild lateral displacement but does not appear to involve the greater or lesser tuberosities. Severe glenohumeral osteoarthritis. Moderate acromioclavicular degenerative change.   . Rib fracture 11/04/2013  . Hypothyroid 11/04/2013  . Hypertension 11/04/2013  . Right shoulder pain 11/04/2013    11/04/13 XR R shoulder IMPRESSION: Incomplete, nondisplaced fracture of the proximal humerus diaphysis. Severe osteoarthritis of the right shoulder.   . OSA (obstructive sleep apnea) 11/13/2008    PSG 09/21/08>>AHI 44.7 Declined therapy   . BOOP (bronchiolitis obliterans with organizing pneumonia) ([place]) 08/25/2008    CT chest 08/18/08>>Mass-like consolidation paramediastinal RUL.  Mildly enlarged Rt paratracheal LN.  GGO RUL. PET scan 10/02/74>>EGBTDVVO hypermetabolic uptake Rt apical process.  LLL consolidation w/o uptake 2nd to contusion and Rib fx. CT chest 09/29/08>> Near complete resolution of right upper lobe paramediastinal process CT chest 06/15/10>>GGO RUL  CT chest 01/17/11>>scarring RUL, no other suspicious findings     Social History   Occupational History  . Occupation: Retired  Tobacco  Use  . Smoking status: Former Smoker    Packs/day: 0.20    Years: 10.00    Pack years: 2.00    Types: Cigarettes    Last attempt to quit: 04/11/1984    Years since quitting: 34.1  . Smokeless tobacco: Never Used  Substance and Sexual Activity  . Alcohol  use: Yes    Alcohol/week: 0.0 standard drinks    Comment: cocktail daily  . Drug use: No  . 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 Anemia    h/o hemorrhoidal bleeding and blood transfusion  . Cardiomegaly   . Chronic diastolic CHF (congestive heart failure) ([place])   . Depression   . Diverticulosis   . DOE (dyspnea on exertion)   . Esophageal reflux   . GERD (gastroesophageal reflux disease)   . Hypothyroidism   . Insomnia   . LBP (low back pain)   . OAB (overactive bladder)   . Obesity   . OSA (obstructive sleep apnea)   . Osteoarthritis   . Rheumatoid arthritis(714.0)   . Shoulder pain, bilateral   . Unspecified essential hypertension   . Venous insufficiency      Past Surgical History:  Procedure Laterality Date  . APPENDECTOMY  1953  . bladder abduction-1996  1996  . breast biopsy Right 1980  . [place]  . COSMETIC SURGERY  1996  . CYSTOCELE REPAIR    . FLEXIBLE SIGMOIDOSCOPY N/A 04/10/2015   Procedure: FLEXIBLE SIGMOIDOSCOPY;  Surgeon: Arta Silence, MD;  Location: [facility] ENDOSCOPY;  Service: Endoscopy;  Laterality: N/A;  . knee arthroscopy Right 1996, 2010  . KNEE ARTHROSCOPY W/ AUTOGENOUS CARTILAGE IMPLANTATION (ACI) PROCEDURE Left 1994, 1995  . SHOULDER SURGERY  1990  . TOTAL ABDOMINAL HYSTERECTOMY  1972  . VESICOVAGINAL FISTULA CLOSURE W/ TAH    . WRIST SURGERY  1967    family history includes Breast cancer in her maternal aunt; COPD in her father; Heart attack (age of onset: 22) in her maternal grandmother; Lung cancer in her mother and son. No results for input(s): HGBA1C, LABURIC, CREATININE, CALCIUM, MG, AST, ALT, CKTOTAL, TSH in the last 8760  hours.  OBJECTIVE:  VS:  HT:5' 3.5" (161.3 cm)   WT:212 lb (96.2 kg)  BMI:36.96    BP:126/78  HR:64bpm  TEMP: ( )  RESP:93 %   PHYSICAL EXAM: Well-developed, Well-nourished and In no acute distress  Pupils are equal., EOM intact without nystagmus. and No scleral icterus.  Alert & appropriately interactive. and Not depressed or anxious appearing.  Warm and well perfused   Right shoulder is held and anterior carriage with minimal internal and external rotation.  She has moderate glenohumeral flexion and extension with only scapular motion to help with upper extremity lateral motion.  She has a small amount of pain with axial load.  Shoulder abduction is to 30 degrees only.  No internal and external rotation in this plane.   ASSESSMENT:  1. Closed fracture of proximal end of right humerus, unspecified fracture morphology, sequela   2. Primary osteoarthritis of right shoulder     PROCEDURES:  US Guided Injection  per procedure note    PROCEDURE NOTE: THERAPEUTIC EXERCISES [phone])   Discussed the foundation of treatment for this condition is physical therapy and/or daily (5-6 days/week) therapeutic exercises, focusing on core strengthening, coordination, neuromuscular control/reeducation. 15 minutes spent for Therapeutic exercises as below and as referenced in the AVS. This included exercises focusing on stretching, strengthening, with significant focus on eccentric aspects.  Proper technique shown and discussed handout in great detail with ATC. All questions were discussed and answered.   Long term goals include an improvement in range of motion, strength, endurance as well as avoiding reinjury. Frequency of visits is one time as determined during today's office visit. Frequency of exercises to be performed is as per handout.  EXERCISES REVIEWED:  Codman exercises, wall glides and table glides      PLAN:  Pertinent additional documentation may be included in corresponding procedure  notes, imaging studies, problem based documentation and patient instructions.  OA (osteoarthritis) of shoulder Multifactorial shoulder pain.  Ultimately given the degenerative changes she could be considered for a total shoulder replacement however given her other comorbidities she is not an ideal candidate. Ultrasound-guided intra-articular injection discussed today. Avoidance of exacerbating activities. Follow-up for repeat injections in 10 weeks  Home Therapeutic exercises prescribed today per procedure note.    Activity modifications and the importance of avoiding exacerbating activities (limiting pain to no more than a 4 / 10 during or following activity) recommended and discussed.   Discussed red flag symptoms that warrant earlier emergent evaluation and patient voices understanding.    No orders of the defined types were placed in this encounter. Lab Orders  No laboratory test(s) ordered today   Imaging Orders  No imaging studies ordered today   Referral Orders  No referral(s) requested today    At follow up will plan to consider : repeat corticosteroid injections  No follow-ups on file.          Gerda Diss, [facility] Physician

## 2018-05-16 NOTE — Procedures (Signed)
PROCEDURE NOTE:  Ultrasound Guided: Injection: Right shoulder, Intra-articular Images were obtained and interpreted by myself, Teresa Coombs, DO  Images have been saved and stored to PACS system. Images obtained on: GE S7 Ultrasound machine    ULTRASOUND FINDINGS:  Marked degenerative change with small to moderate effusion. Poor tissue mobility  DESCRIPTION OF PROCEDURE:  The patient's clinical condition is marked by substantial pain and/or significant functional disability. Other conservative therapy has not provided relief, is contraindicated, or not appropriate. There is a reasonable likelihood that injection will significantly improve the patient's pain and/or functional impairment.   After discussing the risks, benefits and expected outcomes of the injection and all questions were reviewed and answered, the patient wished to undergo the above named procedure.  Verbal consent was obtained.  The ultrasound was used to identify the target structure and adjacent neurovascular structures. The skin was then prepped in sterile fashion and the target structure was injected under direct visualization using sterile technique as below:  Single injection performed as below: PREP: Alcohol and Ethel Chloride APPROACH:posterior, single injection, 21g 2 in. INJECTATE: 2 cc 0.5% Marcaine and 2 cc 40mg /mL DepoMedrol ASPIRATE: None DRESSING: Band-Aid  Post procedural instructions including recommending icing and warning signs for infection were reviewed.    This procedure was well tolerated and there were no complications.   IMPRESSION: Succesful Ultrasound Guided: Injection

## 2018-06-04 ENCOUNTER — Other Ambulatory Visit: Payer: Self-pay | Admitting: Cardiology

## 2018-06-04 DIAGNOSIS — I714 Abdominal aortic aneurysm, without rupture, unspecified: Secondary | ICD-10-CM

## 2018-07-04 DIAGNOSIS — H353132 Nonexudative age-related macular degeneration, bilateral, intermediate dry stage: Secondary | ICD-10-CM | POA: Diagnosis not present

## 2018-07-04 DIAGNOSIS — H34231 Retinal artery branch occlusion, right eye: Secondary | ICD-10-CM | POA: Diagnosis not present

## 2018-07-04 DIAGNOSIS — G453 Amaurosis fugax: Secondary | ICD-10-CM | POA: Diagnosis not present

## 2018-07-04 DIAGNOSIS — H35031 Hypertensive retinopathy, right eye: Secondary | ICD-10-CM | POA: Diagnosis not present

## 2018-07-05 DIAGNOSIS — I10 Essential (primary) hypertension: Secondary | ICD-10-CM | POA: Diagnosis not present

## 2018-07-05 DIAGNOSIS — R011 Cardiac murmur, unspecified: Secondary | ICD-10-CM | POA: Diagnosis not present

## 2018-07-05 DIAGNOSIS — G458 Other transient cerebral ischemic attacks and related syndromes: Secondary | ICD-10-CM | POA: Diagnosis not present

## 2018-07-05 DIAGNOSIS — Z6837 Body mass index (BMI) 37.0-37.9, adult: Secondary | ICD-10-CM | POA: Diagnosis not present

## 2018-07-05 DIAGNOSIS — G4733 Obstructive sleep apnea (adult) (pediatric): Secondary | ICD-10-CM | POA: Diagnosis not present

## 2018-07-05 DIAGNOSIS — D6489 Other specified anemias: Secondary | ICD-10-CM | POA: Diagnosis not present

## 2018-07-05 DIAGNOSIS — E7849 Other hyperlipidemia: Secondary | ICD-10-CM | POA: Diagnosis not present

## 2018-07-05 DIAGNOSIS — E038 Other specified hypothyroidism: Secondary | ICD-10-CM | POA: Diagnosis not present

## 2018-07-17 ENCOUNTER — Other Ambulatory Visit

## 2018-07-25 ENCOUNTER — Ambulatory Visit: Admitting: Cardiology

## 2018-07-26 ENCOUNTER — Ambulatory Visit: Payer: Medicare Other | Admitting: Sports Medicine

## 2018-08-16 DIAGNOSIS — Z20828 Contact with and (suspected) exposure to other viral communicable diseases: Secondary | ICD-10-CM | POA: Diagnosis not present

## 2018-08-22 DIAGNOSIS — R509 Fever, unspecified: Secondary | ICD-10-CM | POA: Diagnosis not present

## 2018-08-22 DIAGNOSIS — I10 Essential (primary) hypertension: Secondary | ICD-10-CM | POA: Diagnosis not present

## 2018-08-22 DIAGNOSIS — R112 Nausea with vomiting, unspecified: Secondary | ICD-10-CM | POA: Diagnosis not present

## 2018-08-23 DIAGNOSIS — R112 Nausea with vomiting, unspecified: Secondary | ICD-10-CM | POA: Diagnosis not present

## 2018-08-28 DIAGNOSIS — E785 Hyperlipidemia, unspecified: Secondary | ICD-10-CM | POA: Diagnosis not present

## 2018-08-28 DIAGNOSIS — G4733 Obstructive sleep apnea (adult) (pediatric): Secondary | ICD-10-CM | POA: Diagnosis not present

## 2018-08-28 DIAGNOSIS — I10 Essential (primary) hypertension: Secondary | ICD-10-CM | POA: Diagnosis not present

## 2018-08-28 DIAGNOSIS — R112 Nausea with vomiting, unspecified: Secondary | ICD-10-CM | POA: Diagnosis not present

## 2018-08-30 ENCOUNTER — Other Ambulatory Visit: Payer: Self-pay | Admitting: Gastroenterology

## 2018-08-30 DIAGNOSIS — R1013 Epigastric pain: Secondary | ICD-10-CM | POA: Diagnosis not present

## 2018-09-05 DIAGNOSIS — D649 Anemia, unspecified: Secondary | ICD-10-CM | POA: Diagnosis not present

## 2018-09-11 ENCOUNTER — Other Ambulatory Visit: Payer: Medicare Other

## 2018-09-13 ENCOUNTER — Emergency Department (HOSPITAL_COMMUNITY): Payer: Medicare Other

## 2018-09-13 ENCOUNTER — Encounter (HOSPITAL_COMMUNITY): Payer: Self-pay | Admitting: *Deleted

## 2018-09-13 ENCOUNTER — Inpatient Hospital Stay (HOSPITAL_COMMUNITY)
Admission: EM | Admit: 2018-09-13 | Discharge: 2018-09-17 | DRG: 641 | Disposition: A | Discharge status: Home Health | Payer: Medicare Other | Attending: Internal Medicine | Admitting: Internal Medicine

## 2018-09-13 ENCOUNTER — Other Ambulatory Visit: Payer: Self-pay

## 2018-09-13 DIAGNOSIS — G2581 Restless legs syndrome: Secondary | ICD-10-CM | POA: Diagnosis present

## 2018-09-13 DIAGNOSIS — I5032 Chronic diastolic (congestive) heart failure: Secondary | ICD-10-CM | POA: Diagnosis not present

## 2018-09-13 DIAGNOSIS — G4733 Obstructive sleep apnea (adult) (pediatric): Secondary | ICD-10-CM | POA: Diagnosis present

## 2018-09-13 DIAGNOSIS — Z7951 Long term (current) use of inhaled steroids: Secondary | ICD-10-CM

## 2018-09-13 DIAGNOSIS — I110 Hypertensive heart disease with heart failure: Secondary | ICD-10-CM | POA: Diagnosis not present

## 2018-09-13 DIAGNOSIS — N3281 Overactive bladder: Secondary | ICD-10-CM | POA: Diagnosis not present

## 2018-09-13 DIAGNOSIS — M545 Low back pain, unspecified: Secondary | ICD-10-CM

## 2018-09-13 DIAGNOSIS — Z825 Family history of asthma and other chronic lower respiratory diseases: Secondary | ICD-10-CM

## 2018-09-13 DIAGNOSIS — K219 Gastro-esophageal reflux disease without esophagitis: Secondary | ICD-10-CM | POA: Diagnosis present

## 2018-09-13 DIAGNOSIS — I10 Essential (primary) hypertension: Secondary | ICD-10-CM | POA: Diagnosis present

## 2018-09-13 DIAGNOSIS — E860 Dehydration: Secondary | ICD-10-CM | POA: Diagnosis present

## 2018-09-13 DIAGNOSIS — M160 Bilateral primary osteoarthritis of hip: Secondary | ICD-10-CM | POA: Diagnosis not present

## 2018-09-13 DIAGNOSIS — M4317 Spondylolisthesis, lumbosacral region: Secondary | ICD-10-CM | POA: Diagnosis not present

## 2018-09-13 DIAGNOSIS — H409 Unspecified glaucoma: Secondary | ICD-10-CM | POA: Diagnosis present

## 2018-09-13 DIAGNOSIS — Z888 Allergy status to other drugs, medicaments and biological substances status: Secondary | ICD-10-CM | POA: Diagnosis not present

## 2018-09-13 DIAGNOSIS — Z79890 Hormone replacement therapy (postmenopausal): Secondary | ICD-10-CM | POA: Diagnosis not present

## 2018-09-13 DIAGNOSIS — Z801 Family history of malignant neoplasm of trachea, bronchus and lung: Secondary | ICD-10-CM | POA: Diagnosis not present

## 2018-09-13 DIAGNOSIS — M549 Dorsalgia, unspecified: Secondary | ICD-10-CM

## 2018-09-13 DIAGNOSIS — Z20828 Contact with and (suspected) exposure to other viral communicable diseases: Secondary | ICD-10-CM | POA: Diagnosis present

## 2018-09-13 DIAGNOSIS — M069 Rheumatoid arthritis, unspecified: Secondary | ICD-10-CM | POA: Diagnosis present

## 2018-09-13 DIAGNOSIS — Z8249 Family history of ischemic heart disease and other diseases of the circulatory system: Secondary | ICD-10-CM

## 2018-09-13 DIAGNOSIS — M47816 Spondylosis without myelopathy or radiculopathy, lumbar region: Secondary | ICD-10-CM | POA: Diagnosis not present

## 2018-09-13 DIAGNOSIS — Z803 Family history of malignant neoplasm of breast: Secondary | ICD-10-CM | POA: Diagnosis not present

## 2018-09-13 DIAGNOSIS — Z7982 Long term (current) use of aspirin: Secondary | ICD-10-CM

## 2018-09-13 DIAGNOSIS — G8929 Other chronic pain: Secondary | ICD-10-CM | POA: Diagnosis present

## 2018-09-13 DIAGNOSIS — R001 Bradycardia, unspecified: Secondary | ICD-10-CM | POA: Diagnosis present

## 2018-09-13 DIAGNOSIS — E871 Hypo-osmolality and hyponatremia: Secondary | ICD-10-CM | POA: Diagnosis not present

## 2018-09-13 DIAGNOSIS — E039 Hypothyroidism, unspecified: Secondary | ICD-10-CM

## 2018-09-13 DIAGNOSIS — K5790 Diverticulosis of intestine, part unspecified, without perforation or abscess without bleeding: Secondary | ICD-10-CM | POA: Diagnosis present

## 2018-09-13 DIAGNOSIS — R531 Weakness: Secondary | ICD-10-CM | POA: Diagnosis not present

## 2018-09-13 DIAGNOSIS — R42 Dizziness and giddiness: Secondary | ICD-10-CM | POA: Diagnosis not present

## 2018-09-13 LAB — BASIC METABOLIC PANEL
Anion gap: 8 (ref 5–15)
BUN: 15 mg/dL (ref 8–23)
CO2: 23 mmol/L (ref 22–32)
Calcium: 8.7 mg/dL — ABNORMAL LOW (ref 8.9–10.3)
Chloride: 80 mmol/L — ABNORMAL LOW (ref 98–111)
Creatinine, Ser: 0.76 mg/dL (ref 0.44–1.00)
GFR calc Af Amer: >60 mL/min (ref >60)
GFR calc non Af Amer: >60 mL/min (ref >60)
Glucose, Bld: 111 mg/dL — ABNORMAL HIGH (ref 70–99)
Potassium: 4.8 mmol/L (ref 3.5–5.1)
Sodium: 111 mmol/L — CL (ref 135–145)

## 2018-09-13 LAB — CBC
HCT: 32.7 % — ABNORMAL LOW (ref 36.0–46.0)
Hemoglobin: 11.7 g/dL — ABNORMAL LOW (ref 12.0–15.0)
MCH: 30.5 pg (ref 26.0–34.0)
MCHC: 35.8 g/dL (ref 30.0–36.0)
MCV: 85.4 fL (ref 80.0–100.0)
Platelets: 272 10*3/uL (ref 150–400)
RBC: 3.83 MIL/uL — ABNORMAL LOW (ref 3.87–5.11)
RDW: 12.4 % (ref 11.5–15.5)
WBC: 7.3 10*3/uL (ref 4.0–10.5)
nRBC: 0 % (ref 0.0–0.2)

## 2018-09-13 LAB — URINALYSIS, ROUTINE W REFLEX MICROSCOPIC
Bilirubin Urine: NEGATIVE
Glucose, UA: NEGATIVE mg/dL
Hgb urine dipstick: NEGATIVE
Ketones, ur: NEGATIVE mg/dL
Leukocytes,Ua: NEGATIVE
Nitrite: NEGATIVE
Protein, ur: NEGATIVE mg/dL
Specific Gravity, Urine: 1.008 (ref 1.005–1.030)
pH: 7 (ref 5.0–8.0)

## 2018-09-13 LAB — SARS CORONAVIRUS 2 BY RT PCR (HOSPITAL ORDER, PERFORMED IN ~~LOC~~ HOSPITAL LAB): SARS Coronavirus 2: NEGATIVE

## 2018-09-13 LAB — CBG MONITORING, ED: Glucose-Capillary: 100 mg/dL — ABNORMAL HIGH (ref 70–99)

## 2018-09-13 LAB — SODIUM, URINE, RANDOM: Sodium, Ur: 48 mmol/L

## 2018-09-13 LAB — CREATININE, URINE, RANDOM: Creatinine, Urine: 37.05 mg/dL

## 2018-09-13 MED ORDER — HYDROCODONE-ACETAMINOPHEN 5-325 MG PO TABS
1.0000 | ORAL_TABLET | Freq: Once | ORAL | Status: AC
  Administered 2018-09-13: 18:00:00 1 via ORAL
  Filled 2018-09-13: qty 1

## 2018-09-13 MED ORDER — ONDANSETRON HCL 4 MG PO TABS: 4.0000 mg | ORAL_TABLET | Freq: Four times a day (QID) | ORAL | Status: DC | PRN

## 2018-09-13 MED ORDER — HYDROCODONE-ACETAMINOPHEN 7.5-325 MG PO TABS
1.0000 | ORAL_TABLET | Freq: Four times a day (QID) | ORAL | Status: DC | PRN
  Administered 2018-09-14 – 2018-09-16 (×7): 1 via ORAL
  Filled 2018-09-13 (×7): qty 1

## 2018-09-13 MED ORDER — ROPINIROLE HCL 1 MG PO TABS: 1.0000 mg | ORAL_TABLET | ORAL | Status: DC

## 2018-09-13 MED ORDER — LEVOTHYROXINE SODIUM 100 MCG PO TABS
100.0000 ug | ORAL_TABLET | Freq: Every day | ORAL | Status: DC
  Administered 2018-09-14 – 2018-09-17 (×4): 100 ug via ORAL
  Filled 2018-09-13 (×5): qty 1

## 2018-09-13 MED ORDER — MULTI-DAY VITAMINS PO TABS: ORAL_TABLET | Freq: Every day | ORAL | Status: DC

## 2018-09-13 MED ORDER — FLUTICASONE PROPIONATE 50 MCG/ACT NA SUSP: 2.0000 | Freq: Every day | NASAL | Status: DC | PRN

## 2018-09-13 MED ORDER — SODIUM CHLORIDE 0.9 % IV SOLN
INTRAVENOUS | Status: DC
  Administered 2018-09-13 – 2018-09-14 (×2): via INTRAVENOUS

## 2018-09-13 MED ORDER — ENOXAPARIN SODIUM 40 MG/0.4ML ~~LOC~~ SOLN
40.0000 mg | SUBCUTANEOUS | Status: DC
  Administered 2018-09-13 – 2018-09-16 (×4): 40 mg via SUBCUTANEOUS
  Filled 2018-09-13 (×4): qty 0.4

## 2018-09-13 MED ORDER — HYDRALAZINE HCL 25 MG PO TABS
25.0000 mg | ORAL_TABLET | Freq: Two times a day (BID) | ORAL | Status: DC
  Administered 2018-09-14 – 2018-09-17 (×8): 25 mg via ORAL
  Filled 2018-09-13 (×8): qty 1

## 2018-09-13 MED ORDER — ONDANSETRON HCL 4 MG PO TABS: 4.0000 mg | ORAL_TABLET | ORAL | Status: DC

## 2018-09-13 MED ORDER — ONDANSETRON HCL 4 MG/2ML IJ SOLN
4.0000 mg | Freq: Once | INTRAMUSCULAR | Status: AC
  Administered 2018-09-13: 4 mg via INTRAVENOUS
  Filled 2018-09-13: qty 2

## 2018-09-13 MED ORDER — LOSARTAN POTASSIUM 50 MG PO TABS
100.0000 mg | ORAL_TABLET | Freq: Two times a day (BID) | ORAL | Status: DC
  Administered 2018-09-13 – 2018-09-17 (×8): 100 mg via ORAL
  Filled 2018-09-13 (×8): qty 2

## 2018-09-13 MED ORDER — PROSIGHT PO TABS
1.0000 | ORAL_TABLET | Freq: Two times a day (BID) | ORAL | Status: DC
  Administered 2018-09-13 – 2018-09-17 (×8): 1 via ORAL
  Filled 2018-09-13 (×8): qty 1

## 2018-09-13 MED ORDER — PANTOPRAZOLE SODIUM 40 MG PO TBEC
40.0000 mg | DELAYED_RELEASE_TABLET | Freq: Every day | ORAL | Status: DC
  Administered 2018-09-14 – 2018-09-17 (×4): 40 mg via ORAL
  Filled 2018-09-13 (×4): qty 1

## 2018-09-13 MED ORDER — FLUTICASONE FUROATE-VILANTEROL 100-25 MCG/INH IN AEPB: 1.0000 | INHALATION_SPRAY | Freq: Every day | RESPIRATORY_TRACT | Status: DC | PRN

## 2018-09-13 MED ORDER — VITAMIN D 25 MCG (1000 UNIT) PO TABS
5000.0000 [IU] | ORAL_TABLET | Freq: Every day | ORAL | Status: DC
  Administered 2018-09-14 – 2018-09-17 (×4): 5000 [IU] via ORAL
  Filled 2018-09-13 (×4): qty 5

## 2018-09-13 MED ORDER — CARVEDILOL 25 MG PO TABS
25.0000 mg | ORAL_TABLET | Freq: Two times a day (BID) | ORAL | Status: DC
  Administered 2018-09-14 – 2018-09-17 (×6): 25 mg via ORAL
  Filled 2018-09-13 (×8): qty 1

## 2018-09-13 MED ORDER — ASPIRIN EC 81 MG PO TBEC
81.0000 mg | DELAYED_RELEASE_TABLET | Freq: Every day | ORAL | Status: DC
  Administered 2018-09-14 – 2018-09-17 (×4): 81 mg via ORAL
  Filled 2018-09-13 (×4): qty 1

## 2018-09-13 MED ORDER — LATANOPROST 0.005 % OP SOLN
1.0000 [drp] | Freq: Every day | OPHTHALMIC | Status: DC
  Administered 2018-09-13 – 2018-09-16 (×4): 1 [drp] via OPHTHALMIC
  Filled 2018-09-13: qty 2.5

## 2018-09-13 MED ORDER — BUSPIRONE HCL 5 MG PO TABS
7.5000 mg | ORAL_TABLET | Freq: Every day | ORAL | Status: DC | PRN
  Administered 2018-09-14 – 2018-09-16 (×3): 7.5 mg via ORAL
  Filled 2018-09-13 (×4): qty 2

## 2018-09-13 MED ORDER — ONDANSETRON HCL 4 MG/2ML IJ SOLN
4.0000 mg | Freq: Four times a day (QID) | INTRAMUSCULAR | Status: DC | PRN
  Administered 2018-09-13: 4 mg via INTRAVENOUS
  Filled 2018-09-13: qty 2

## 2018-09-13 MED ORDER — ROPINIROLE HCL 1 MG PO TABS
1.0000 mg | ORAL_TABLET | Freq: Every evening | ORAL | Status: DC | PRN
  Administered 2018-09-14 – 2018-09-16 (×3): 1 mg via ORAL
  Filled 2018-09-13 (×4): qty 1

## 2018-09-13 MED ORDER — MORPHINE SULFATE (PF) 2 MG/ML IV SOLN
2.0000 mg | Freq: Once | INTRAVENOUS | Status: AC
  Administered 2018-09-13: 2 mg via INTRAVENOUS
  Filled 2018-09-13: qty 1

## 2018-09-13 MED ORDER — SODIUM CHLORIDE 0.9% FLUSH: 3.0000 mL | Freq: Once | INTRAVENOUS | Status: DC

## 2018-09-13 MED ORDER — ROPINIROLE HCL 1 MG PO TABS
1.0000 mg | ORAL_TABLET | Freq: Every day | ORAL | Status: DC
  Administered 2018-09-14 – 2018-09-17 (×4): 1 mg via ORAL
  Filled 2018-09-13 (×4): qty 1

## 2018-09-13 MED ORDER — SENNOSIDES-DOCUSATE SODIUM 8.6-50 MG PO TABS: 1.0000 | ORAL_TABLET | Freq: Every day | ORAL | Status: DC | PRN

## 2018-09-13 NOTE — ED Notes (Signed)
ED TO INPATIENT HANDOFF REPORT  ED Nurse Name and Phone #: Caprice Kluver [phone]  S Name/Age/Gender Lisa Mcclure 85 y.o. female Room/Bed: 025C/025C  Code Status   Code Status: Prior  Home/SNF/Other Rehab Patient oriented to: self, place, time and situation Is this baseline? Yes   Triage Complete: Triage complete  Chief Complaint Low Sodium  Triage Note Pt transported by EMS, from home, she reports seeing her  PCP today with blood drawn.  Reports critical low NA level.  She reports gait problems x few days.  Denies dizziness, cp, or sob. She is A&O x 4.    Allergies Allergies  Allergen Reactions  . Statins Other (See Comments)    Muscle aches and INTERNAL BLEEDING  . Gabapentin Other (See Comments)    "Made me loopy"  . Methocarbamol Other (See Comments)    "Made me loopy"    Level of Care/Admitting Diagnosis ED Disposition    ED Disposition Condition [facility]: [place] [[id number]]  Level of Care: Telemetry Medical [104]  Covid Evaluation: N/A  Diagnosis: Dehydration with hyponatremia [545625]  Admitting Physician: Elwyn Reach [[id number]]  Attending Physician: Elwyn Reach [[id number]]  Estimated length of stay: past midnight tomorrow  Certification:: I certify this patient will need inpatient services for at least 2 midnights  PT Class (Do Not Modify): Inpatient [101]  PT Acc Code (Do Not Modify): Private [1]       B Medical/Surgery History Past Medical History:  Diagnosis Date  . Anemia    h/o hemorrhoidal bleeding and blood transfusion  . Cardiomegaly   . Chronic diastolic CHF (congestive heart failure) ([place])   . Depression   . Diverticulosis   . DOE (dyspnea on exertion)   . Esophageal reflux   . GERD (gastroesophageal reflux disease)   . Hypothyroidism   . Insomnia   . LBP (low back pain)   . OAB (overactive bladder)   . Obesity   . OSA (obstructive sleep apnea)   . Osteoarthritis   . Rheumatoid  arthritis(714.0)   . Shoulder pain, bilateral   . Unspecified essential hypertension   . Venous insufficiency    Past Surgical History:  Procedure Laterality Date  . APPENDECTOMY  1953  . bladder abduction-1996  1996  . breast biopsy Right 1980  . [place]  . COSMETIC SURGERY  1996  . CYSTOCELE REPAIR    . FLEXIBLE SIGMOIDOSCOPY N/A 04/10/2015   Procedure: FLEXIBLE SIGMOIDOSCOPY;  Surgeon: Arta Silence, MD;  Location: [facility] ENDOSCOPY;  Service: Endoscopy;  Laterality: N/A;  . knee arthroscopy Right 1996, 2010  . KNEE ARTHROSCOPY W/ AUTOGENOUS CARTILAGE IMPLANTATION (ACI) PROCEDURE Left 1994, 1995  . SHOULDER SURGERY  1990  . TOTAL ABDOMINAL HYSTERECTOMY  1972  . VESICOVAGINAL FISTULA CLOSURE W/ TAH    . WRIST SURGERY  1967     A IV Location/Drains/Wounds Patient Lines/Drains/Airways Status   Active Line/Drains/Airways    Name:   Placement date:   Placement time:   Site:   Days:   Peripheral IV 09/13/18 Left Antecubital   09/13/18    1736    Antecubital   less than 1          Intake/Output Last 24 hours No intake or output data in the 24 hours ending 09/13/18 1825  Labs/Imaging Results for orders placed or performed during the hospital encounter of 09/13/18 (from the past 48 hour(s))  Basic metabolic panel  Status: Abnormal   Collection Time: 09/13/18  3:21 PM  Result Value Ref Range   Sodium 111 (LL) 135 - 145 mmol/L    Comment: REPEATED TO VERIFY CRITICAL RESULT CALLED TO, READ BACK BY AND VERIFIED WITH: K BROWN,RN 1732 09/13/2018 WBOND    Potassium 4.8 3.5 - 5.1 mmol/L   Chloride 80 (L) 98 - 111 mmol/L   CO2 23 22 - 32 mmol/L   Glucose, Bld 111 (H) 70 - 99 mg/dL   BUN 15 8 - 23 mg/dL   Creatinine, Ser 0.76 0.44 - 1.00 mg/dL   Calcium 8.7 (L) 8.9 - 10.3 mg/dL   GFR calc non Af Amer >60 >60 mL/min   GFR calc Af Amer >60 >60 mL/min   Anion gap 8 5 - 15    Comment: Performed at [facility] [address], [place],  [place] [address]  CBC     Status: Abnormal   Collection Time: 09/13/18  3:21 PM  Result Value Ref Range   WBC 7.3 4.0 - 10.5 K/uL   RBC 3.83 (L) 3.87 - 5.11 MIL/uL   Hemoglobin 11.7 (L) 12.0 - 15.0 g/dL   HCT 32.7 (L) 36.0 - 46.0 %   MCV 85.4 80.0 - 100.0 fL   MCH 30.5 26.0 - 34.0 pg   MCHC 35.8 30.0 - 36.0 g/dL   RDW 12.4 11.5 - 15.5 %   Platelets 272 150 - 400 K/uL   nRBC 0.0 0.0 - 0.2 %    Comment: Performed at [facility], [place] [address], [place], [place] [address]  SARS Coronavirus 2 (CEPHEID - Performed in [facility] hospital lab), Hosp Order     Status: None   Collection Time: 09/13/18  4:52 PM  Result Value Ref Range   SARS Coronavirus 2 NEGATIVE NEGATIVE    Comment: (NOTE) If result is NEGATIVE SARS-CoV-2 target nucleic acids are NOT DETECTED. The SARS-CoV-2 RNA is generally detectable in upper and lower  respiratory specimens during the acute phase of infection. The lowest  concentration of SARS-CoV-2 viral copies this assay can detect is 250  copies / mL. A negative result does not preclude SARS-CoV-2 infection  and should not be used as the sole basis for treatment or other  patient management decisions.  A negative result may occur with  improper specimen collection / handling, submission of specimen other  than nasopharyngeal swab, presence of viral mutation(s) within the  areas targeted by this assay, and inadequate number of viral copies  (<250 copies / mL). A negative result must be combined with clinical  observations, patient history, and epidemiological information. If result is POSITIVE SARS-CoV-2 target nucleic acids are DETECTED. The SARS-CoV-2 RNA is generally detectable in upper and lower  respiratory specimens dur ing the acute phase of infection.  Positive  results are indicative of active infection with SARS-CoV-2.  Clinical  correlation with patient history and other diagnostic information is  necessary to determine patient infection status.   Positive results do  not rule out bacterial infection or co-infection with other viruses. If result is PRESUMPTIVE POSTIVE SARS-CoV-2 nucleic acids MAY BE PRESENT.   A presumptive positive result was obtained on the submitted specimen  and confirmed on repeat testing.  While 2019 novel coronavirus  (SARS-CoV-2) nucleic acids may be present in the submitted sample  additional confirmatory testing may be necessary for epidemiological  and / or clinical management purposes  to differentiate between  SARS-CoV-2 and other Sarbecovirus currently known  to infect humans.  If clinically indicated additional testing with an alternate test  methodology [phone]) is advised. The SARS-CoV-2 RNA is generally  detectable in upper and lower respiratory sp ecimens during the acute  phase of infection. The expected result is Negative. Fact Sheet for Patients:  StrictlyIdeas.no Fact Sheet for Healthcare Providers: BankingDealers.co.za This test is not yet approved or cleared by the [place] FDA and has been authorized for detection and/or diagnosis of SARS-CoV-2 by FDA under an Emergency Use Authorization (EUA).  This EUA will remain in effect (meaning this test can be used) for the duration of the COVID-19 declaration under Section 564(b)(1) of the Act, 21 U.S.C. section 360bbb-3(b)(1), unless the authorization is terminated or revoked sooner. Performed at [facility], [place] [address] [place], [place] [address]   Urinalysis, Routine w reflex microscopic     Status: Abnormal   Collection Time: 09/13/18  5:40 PM  Result Value Ref Range   Color, Urine STRAW (A) YELLOW   APPearance CLEAR CLEAR   Specific Gravity, Urine 1.008 1.005 - 1.030   pH 7.0 5.0 - 8.0   Glucose, UA NEGATIVE NEGATIVE mg/dL   Hgb urine dipstick NEGATIVE NEGATIVE   Bilirubin Urine NEGATIVE NEGATIVE   Ketones, ur NEGATIVE NEGATIVE mg/dL   Protein, ur NEGATIVE NEGATIVE  mg/dL   Nitrite NEGATIVE NEGATIVE   Leukocytes,Ua NEGATIVE NEGATIVE    Comment: Performed at [facility] [address] [place], [place] [address]  CBG monitoring, ED     Status: Abnormal   Collection Time: 09/13/18  5:59 PM  Result Value Ref Range   Glucose-Capillary 100 (H) 70 - 99 mg/dL   Dg Chest Port 1 View  Result Date: 09/13/2018 CLINICAL DATA:  Weakness EXAM: PORTABLE CHEST 1 VIEW COMPARISON:  01/28/2016 FINDINGS: Artifact overlies the chest. Heart size upper limits of normal. Chronic aortic atherosclerosis. No evidence of heart failure. No infiltrate, collapse or effusion. Chronic degenerative changes affect the shoulders. IMPRESSION: No active disease.  Aortic atherosclerosis. Electronically Signed   By: Nelson Chimes M.D.   On: 09/13/2018 17:00    Pending Labs Unresulted Labs (From admission, onward)    Start     Ordered   09/13/18 1612  Sodium, urine, random  Once,   R     09/13/18 1611   09/13/18 1612  Creatinine, urine, random  ONCE - STAT,   STAT     09/13/18 1611          Vitals/Pain Today's Vitals   09/13/18 1515 09/13/18 1700 09/13/18 1730  BP: (!) 156/88 (!) 179/77 (!) 193/90  Pulse: (!) 59 (!) 57 61  Resp:  (!) 24 18  Temp: 97.8 F (36.6 C)    TempSrc: Oral    SpO2: 97% 99% 98%  Weight: 93 kg    Height: 5' 3.5" (1.613 m)      Isolation Precautions No active isolations  Medications Medications  sodium chloride flush (NS) 0.9 % injection 3 mL (has no administration in time range)  HYDROcodone-acetaminophen (NORCO/VICODIN) 5-325 MG per tablet 1 tablet (1 tablet Oral Given 09/13/18 1748)  ondansetron (ZOFRAN) injection 4 mg (4 mg Intravenous Given 09/13/18 1737)    Mobility Walks with device High fall risk   Focused Assessments NA   R Recommendations: See Admitting Provider Note  Report given to:   Additional Notes:

## 2018-09-13 NOTE — ED Provider Notes (Signed)
[facility] EMERGENCY DEPARTMENT Provider Note   CSN: [id number] Arrival date & time: 09/13/18  1504    History   Chief Complaint Chief Complaint  Patient presents with  . Low Sodium    HPI Lisa Mcclure is a 85 y.o. female.  She is brought in by ambulance for evaluation of critical abnormal labs.  She is complaining of a month worth of not feeling well with nausea and lack of appetite.  She said her doctor treated her for febrile illness with antibiotics, has not had a fever since then but has not felt well.  For the last few days she has been feeling very weak and unbalanced and today felt like she could not even get out of bed.  No fever no cough no chest pain no shortness of breath no abdominal pain.  Few episodes of diarrhea that has resolved.     The history is provided by the patient.  Weakness  Severity:  Severe Onset quality:  Gradual Timing:  Constant Progression:  Worsening Chronicity:  New Context: recent infection   Relieved by:  Nothing Worsened by:  Activity Ineffective treatments:  Lying down Associated symptoms: difficulty walking and nausea   Associated symptoms: no abdominal pain, no chest pain, no cough, no dysuria, no fever, no shortness of breath and no vomiting     Past Medical History:  Diagnosis Date  . Anemia    h/o hemorrhoidal bleeding and blood transfusion  . Cardiomegaly   . Chronic diastolic CHF (congestive heart failure) ([place])   . Depression   . Diverticulosis   . DOE (dyspnea on exertion)   . Esophageal reflux   . GERD (gastroesophageal reflux disease)   . Hypothyroidism   . Insomnia   . LBP (low back pain)   . OAB (overactive bladder)   . Obesity   . OSA (obstructive sleep apnea)   . Osteoarthritis   . Rheumatoid arthritis(714.0)   . Shoulder pain, bilateral   . Unspecified essential hypertension   . Venous insufficiency     Patient Active Problem List   Diagnosis Date Noted  . OA (osteoarthritis) of  shoulder 05/16/2018  . Aortic stenosis, mild 01/15/2016  . Bilateral carotid bruits 05/11/2015  . Hyponatremia 04/19/2015  . Chronic anemia 04/19/2015  . Panic attack 04/19/2015  . Constipation 04/19/2015  . Hyperkalemia 04/17/2015  . Influenza A 04/16/2015  . Proximal humerus fracture   . Left patella fracture   . Benign essential HTN   . Esophageal reflux   . Chronic diastolic heart failure ([place])   . Thyroid activity decreased   . Lower GI bleed 04/08/2015  . Hematochezia   . Rectal bleeding 04/03/2015  . Patellar fracture 03/06/2015  . Left humeral fracture 11/04/2013  . Rib fracture 11/04/2013  . Hypothyroid 11/04/2013  . Hypertension 11/04/2013  . Right shoulder pain 11/04/2013  . OSA (obstructive sleep apnea) 11/13/2008  . BOOP (bronchiolitis obliterans with organizing pneumonia) ([place]) 08/25/2008    Past Surgical History:  Procedure Laterality Date  . APPENDECTOMY  1953  . bladder abduction-1996  1996  . breast biopsy Right 1980  . [place]  . COSMETIC SURGERY  1996  . CYSTOCELE REPAIR    . FLEXIBLE SIGMOIDOSCOPY N/A 04/10/2015   Procedure: FLEXIBLE SIGMOIDOSCOPY;  Surgeon: Arta Silence, MD;  Location: [facility] ENDOSCOPY;  Service: Endoscopy;  Laterality: N/A;  . knee arthroscopy Right 1996, 2010  . KNEE ARTHROSCOPY W/ AUTOGENOUS CARTILAGE IMPLANTATION (  ACI) PROCEDURE Left 1994, 1995  . SHOULDER SURGERY  1990  . TOTAL ABDOMINAL HYSTERECTOMY  1972  . VESICOVAGINAL FISTULA CLOSURE W/ TAH    . WRIST SURGERY  1967     OB History   No obstetric history on file.      Home Medications    Prior to Admission medications   Medication Sig Start Date End Date Taking? Authorizing Provider  busPIRone (BUSPAR) 7.5 MG tablet Take 1 tablet (7.5 mg total) by mouth 3 (three) times daily. Patient taking differently: Take 7.5 mg by mouth daily.  04/13/15   Barton Dubois, MD  carvedilol (COREG) 25 MG tablet Take 25 mg by mouth 2 (two) times daily.  03/26/18   [provider]  cholecalciferol (VITAMIN D) 1000 UNITS tablet Take 5,000 Units by mouth daily.     [provider]  desmopressin (DDAVP) 0.2 MG tablet Take 200-400 mcg by mouth at bedtime. 04/10/18   [provider]  fluticasone furoate-vilanterol (BREO ELLIPTA) 100-25 MCG/INH AEPB Inhale 1 puff into the lungs daily. 03/21/16   Mannam, Hart Robinsons, MD  furosemide (LASIX) 20 MG tablet Take 1 tablet (20 mg total) by mouth daily. 02/01/16   Dunn, Lisbeth Renshaw N, PA-C  hydrALAZINE (APRESOLINE) 25 MG tablet TAKE 1 TABLET 3 TIMES A DAY MAKE APPT WITH DR Meda Coffee 07/24/17   Dorothy Spark, MD  HYDROcodone-acetaminophen (NORCO) 7.5-325 MG tablet Take 1 tablet by mouth every 6 (six) hours as needed for moderate pain. 04/13/15   Barton Dubois, MD  latanoprost (XALATAN) 0.005 % ophthalmic solution Place 1 drop into both eyes at bedtime.    [provider]  levothyroxine (SYNTHROID, LEVOTHROID) 100 MCG tablet Take 100 mcg by mouth daily before breakfast.    [provider]  losartan (COZAAR) 100 MG tablet Take 100 mg by mouth daily.    [provider]  Multiple Vitamin (MULTI-DAY VITAMINS PO) Take 1 tablet by mouth daily.     [provider]  Multiple Vitamins-Minerals (ICAPS AREDS 2) CAPS Take 1 capsule by mouth 2 (two) times daily.     [provider]  omeprazole (PRILOSEC) 20 MG capsule Take 20 mg by mouth 2 (two) times daily before a meal.     [provider]  rOPINIRole (REQUIP) 1 MG tablet TAKE 1 TABLET BY MOUTH EVERY EVENING FOR RESTLESS LEGS 06/19/15   [provider]  sennosides-docusate sodium (SENOKOT-S) 8.6-50 MG tablet Take 1 tablet by mouth daily. Patient taking differently: Take 1 tablet by mouth daily as needed.  04/13/15   Barton Dubois, MD  spironolactone (ALDACTONE) 25 MG tablet TAKE 1 TABLET BY MOUTH EVERY DAY 07/25/16   Dorothy Spark, MD    Family History Family History  Problem Relation Age of  Onset  . COPD Father   . Lung cancer Mother   . Heart attack Maternal Grandmother 80  . Breast cancer Maternal Aunt   . Lung cancer Son     Social History Social History   Tobacco Use  . Smoking status: Former Smoker    Packs/day: 0.20    Years: 10.00    Pack years: 2.00    Types: Cigarettes    Last attempt to quit: 04/11/1984    Years since quitting: 34.4  . Smokeless tobacco: Never Used  Substance Use Topics  . Alcohol use: Yes    Alcohol/week: 0.0 standard drinks    Comment: cocktail daily  . Drug use: No     Allergies   Gabapentin; Robaxin [  methocarbamol]; and Statins   Review of Systems Review of Systems  Constitutional: Positive for appetite change and fatigue. Negative for fever.  HENT: Negative for sore throat.   Eyes: Negative for visual disturbance.  Respiratory: Negative for cough and shortness of breath.   Cardiovascular: Negative for chest pain.  Gastrointestinal: Positive for nausea. Negative for abdominal pain and vomiting.  Genitourinary: Negative for dysuria.  Musculoskeletal: Negative for neck pain.  Skin: Negative for rash.  Neurological: Positive for weakness.     Physical Exam Updated Vital Signs BP (!) 156/88 (BP Location: Left Arm)   Pulse (!) 59   Temp 97.8 F (36.6 C) (Oral)   Ht 5' 3.5" (1.613 m)   Wt 93 kg   SpO2 97%   BMI 35.74 kg/m   Physical Exam Vitals signs and nursing note reviewed.  Constitutional:      General: She is not in acute distress.    Appearance: She is well-developed.  HENT:     Head: Normocephalic and atraumatic.  Eyes:     Conjunctiva/sclera: Conjunctivae normal.  Neck:     Musculoskeletal: Neck supple.  Cardiovascular:     Rate and Rhythm: Normal rate and regular rhythm.     Heart sounds: No murmur.  Pulmonary:     Effort: Pulmonary effort is normal. No respiratory distress.     Breath sounds: Normal breath sounds.  Abdominal:     Palpations: Abdomen is soft.     Tenderness: There is no  abdominal tenderness.  Musculoskeletal: Normal range of motion.        General: No signs of injury.     Right lower leg: No edema.     Left lower leg: No edema.  Skin:    General: Skin is warm and dry.     Capillary Refill: Capillary refill takes less than 2 seconds.  Neurological:     General: No focal deficit present.     Mental Status: She is alert and oriented to person, place, and time.     Gait: Gait abnormal.     Comments: She is grossly nonfocal neuro exam although just getting her from the wheelchair to the bed she was very unsteady and weak and unsure of herself.  She lives alone and I do not think she would be able to return home.      ED Treatments / Results  Labs (all labs ordered are listed, but only abnormal results are displayed) Labs Reviewed  BASIC METABOLIC PANEL - Abnormal; Notable for the following components:      Result Value   Sodium 111 (*)    Chloride 80 (*)    Glucose, Bld 111 (*)    Calcium 8.7 (*)    All other components within normal limits  CBC - Abnormal; Notable for the following components:   RBC 3.83 (*)    Hemoglobin 11.7 (*)    HCT 32.7 (*)    All other components within normal limits  URINALYSIS, ROUTINE W REFLEX MICROSCOPIC - Abnormal; Notable for the following components:   Color, Urine STRAW (*)    All other components within normal limits  COMPREHENSIVE METABOLIC PANEL - Abnormal; Notable for the following components:   Sodium 115 (*)    Chloride 82 (*)    Glucose, Bld 105 (*)    All other components within normal limits  CBC - Abnormal; Notable for the following components:   HCT 34.4 (*)    All other components within normal limits  OSMOLALITY -  Abnormal; Notable for the following components:   Osmolality 236 (*)    All other components within normal limits  BASIC METABOLIC PANEL - Abnormal; Notable for the following components:   Sodium 120 (*)    Chloride 84 (*)    Glucose, Bld 142 (*)    All other components within  normal limits  CBG MONITORING, ED - Abnormal; Notable for the following components:   Glucose-Capillary 100 (*)    All other components within normal limits  SARS CORONAVIRUS 2 (HOSPITAL ORDER, [place] [facility])  SODIUM, URINE, RANDOM  CREATININE, URINE, RANDOM  OSMOLALITY, URINE  BASIC METABOLIC PANEL  BASIC METABOLIC PANEL    EKG EKG Interpretation  Date/Time:  Thursday September 13 2018 16:17:12 EDT Ventricular Rate:  57 PR Interval:    QRS Duration: 90 QT Interval:  420 QTC Calculation: 409 R Axis:   26 Text Interpretation:  Sinus rhythm Prolonged PR interval Abnormal R-wave progression, early transition new pr prolongation compared with prior 11/16 Confirmed by Aletta Edouard [phone]) on 09/13/2018 4:23:16 PM   Radiology Dg Chest Port 1 View  Result Date: 09/13/2018 CLINICAL DATA:  Weakness EXAM: PORTABLE CHEST 1 VIEW COMPARISON:  01/28/2016 FINDINGS: Artifact overlies the chest. Heart size upper limits of normal. Chronic aortic atherosclerosis. No evidence of heart failure. No infiltrate, collapse or effusion. Chronic degenerative changes affect the shoulders. IMPRESSION: No active disease.  Aortic atherosclerosis. Electronically Signed   By: Mcclure Chimes M.D.   On: 09/13/2018 17:00    Procedures Procedures (including critical care time)  Medications Ordered in ED Medications  sodium chloride flush (NS) 0.9 % injection 3 mL (has no administration in time range)     Initial Impression / Assessment and Plan / ED Course  I have reviewed the triage vital signs and the nursing notes.  Pertinent labs & imaging results that were available during my care of the patient were reviewed by me and considered in my medical decision making (see chart for details).  Clinical Course as of Sep 14 1022  Thu Sep 13, 2262  1778 85 year old female here with weakness and nausea lack of appetite.  Differential diagnosis includes metabolic derangement, anemia, ACS, UTI,  pneumonia, Covid.   [MB]  6720 Discussed with Dr. Jonelle Sidle from the hospitalist service who will evaluate for admission.  Her sodium is critically low at 111 which I think is probably more hypervolemia.  Have held off giving her any fluids.   [MB]    Clinical Course User Index [MB] Hayden Rasmussen, MD   Lisa Mcclure was evaluated in Emergency Department on 09/13/2018 for the symptoms described in the history of present illness. She was evaluated in the context of the global COVID-19 pandemic, which necessitated consideration that the patient might be at risk for infection with the SARS-CoV-2 virus that causes COVID-19. Institutional protocols and algorithms that pertain to the evaluation of patients at risk for COVID-19 are in a state of rapid change based on information released by regulatory bodies including the CDC and federal and state organizations. These policies and algorithms were followed during the patient's care in the ED.     Final Clinical Impressions(s) / ED Diagnoses   Final diagnoses:  Hyponatremia  Weakness generalized    ED Discharge Orders    None       Hayden Rasmussen, MD 09/14/18 1025

## 2018-09-13 NOTE — H&P (Signed)
History and Physical   Lisa Mcclure WNI:[id number] DOB: [birth year] DOA: 09/13/2018  Referring MD/NP/PA: Dr. Melina Copa  PCP: [name], MD   Outpatient Specialists: None  Patient coming from: Home  Chief Complaint: Low sodium  HPI: Lisa Mcclure is a 85 y.o. female with medical history significant of previous hyponatremia, diastolic CHF, restless leg syndrome, GERD, hypothyroidism, rheumatoid arthritis, hypertension and venous insufficiency who came to the ER with weakness and low sodium.  She has been feeling weak with some nausea and lack of appetite on and off for about a month.  She has some febrile illness for which she was given antibiotics within the..  She continues to not feel well.  Today she felt dizzy and on balance.  It got to where she could not get out of bed easily.  She therefore came to the ER for evaluation.  She denied any fever or chills she denied any diarrhea no nausea or vomiting.  Patient found to have sodium of 111.  She also has low chloride.  Patient has been on Lasix and has been drinking a lot of water.  Review of patient's records showed that she has been on daily DDAVP which was stopped back in 2017 due to hyponatremia.  Patient has continued to take it apparently.  She also has hypothyroidism for which she is on levothyroxine.  No prior known history of SIADH.  She is asymptomatic not confused communicating adequately.  ED Course: Temperature is 98 blood pressure 156/88 pulse 63 respiratory 24 oxygen sat 94% room air.  White count 7.3 hemoglobin 11.7 platelet 272.  Sodium 111 with chloride 80 potassium 4.8.  Glucose 111 BUN 15 creatinine 0.76.  COVID-19 testing is negative.  Urinalysis was normal.  Patient will be admitted to the hospital for treatment.  Review of Systems: As per HPI otherwise 10 point review of systems negative.    Past Medical History:  Diagnosis Date  . Anemia    h/o hemorrhoidal bleeding and blood transfusion  . Cardiomegaly    . Chronic diastolic CHF (congestive heart failure) ([place])   . Depression   . Diverticulosis   . DOE (dyspnea on exertion)   . Esophageal reflux   . GERD (gastroesophageal reflux disease)   . Hypothyroidism   . Insomnia   . LBP (low back pain)   . OAB (overactive bladder)   . Obesity   . OSA (obstructive sleep apnea)   . Osteoarthritis   . Rheumatoid arthritis(714.0)   . Shoulder pain, bilateral   . Unspecified essential hypertension   . Venous insufficiency     Past Surgical History:  Procedure Laterality Date  . APPENDECTOMY  1953  . bladder abduction-1996  1996  . breast biopsy Right 1980  . [place]  . COSMETIC SURGERY  1996  . CYSTOCELE REPAIR    . FLEXIBLE SIGMOIDOSCOPY N/A 04/10/2015   Procedure: FLEXIBLE SIGMOIDOSCOPY;  Surgeon: Arta Silence, MD;  Location: [facility] ENDOSCOPY;  Service: Endoscopy;  Laterality: N/A;  . knee arthroscopy Right 1996, 2010  . KNEE ARTHROSCOPY W/ AUTOGENOUS CARTILAGE IMPLANTATION (ACI) PROCEDURE Left 1994, 1995  . SHOULDER SURGERY  1990  . TOTAL ABDOMINAL HYSTERECTOMY  1972  . VESICOVAGINAL FISTULA CLOSURE W/ TAH    . WRIST SURGERY  1967     reports that she quit smoking about 34 years ago. Her smoking use included cigarettes. She has a 2.00 pack-year smoking history. She has never used smokeless tobacco. She reports  current alcohol use. She reports that she does not use drugs.  Allergies  Allergen Reactions  . Statins Other (See Comments)    Muscle aches and INTERNAL BLEEDING  . Gabapentin Other (See Comments)    "Made me loopy"  . Methocarbamol Other (See Comments)    "Made me loopy"    Family History  Problem Relation Age of Onset  . COPD Father   . Lung cancer Mother   . Heart attack Maternal Grandmother 80  . Breast cancer Maternal Aunt   . Lung cancer Son      Prior to Admission medications   Medication Sig Start Date End Date Taking? Authorizing Provider  aspirin EC 81 MG tablet Take 81 mg  by mouth daily.   Yes [provider]  busPIRone (BUSPAR) 7.5 MG tablet Take 1 tablet (7.5 mg total) by mouth 3 (three) times daily. Patient taking differently: Take 7.5 mg by mouth daily as needed (for anxiety).  04/13/15  Yes Barton Dubois, MD  carvedilol (COREG) 25 MG tablet Take 25 mg by mouth 2 (two) times daily. 03/26/18  Yes [provider]  Cholecalciferol (VITAMIN D3) 125 MCG (5000 UT) CAPS Take 5,000 Units by mouth daily with breakfast.   Yes [provider]  fluticasone (FLONASE) 50 MCG/ACT nasal spray Place 2 sprays into both nostrils daily as needed for allergies or rhinitis.  05/08/17  Yes [provider]  fluticasone furoate-vilanterol (BREO ELLIPTA) 100-25 MCG/INH AEPB Inhale 1 puff into the lungs daily. Patient taking differently: Inhale 1 puff into the lungs daily as needed ("for flares").  03/21/16  Yes Mannam, Praveen, MD  furosemide (LASIX) 20 MG tablet Take 1 tablet (20 mg total) by mouth daily. Patient taking differently: Take 20 mg by mouth daily as needed for fluid or edema.  02/01/16  Yes Dunn, Dayna N, PA-C  hydrALAZINE (APRESOLINE) 25 MG tablet TAKE 1 TABLET 3 TIMES A DAY MAKE APPT WITH DR Meda Coffee Patient taking differently: Take 25 mg by mouth 2 (two) times a day.  07/24/17  Yes Dorothy Spark, MD  HYDROcodone-acetaminophen (NORCO) 7.5-325 MG tablet Take 1 tablet by mouth every 6 (six) hours as needed for moderate pain. 04/13/15  Yes Barton Dubois, MD  latanoprost (XALATAN) 0.005 % ophthalmic solution Place 1 drop into both eyes at bedtime.   Yes [provider]  levothyroxine (SYNTHROID, LEVOTHROID) 100 MCG tablet Take 100 mcg by mouth daily before breakfast.   Yes [provider]  losartan (COZAAR) 100 MG tablet Take 100 mg by mouth 2 (two) times daily.    Yes [provider]  Multiple Vitamins-Minerals (ICAPS AREDS 2) CAPS Take 1 capsule by mouth 2 (two) times daily.    Yes [provider]   omeprazole (PRILOSEC) 20 MG capsule Take 20 mg by mouth 2 (two) times daily before a meal.    Yes [provider]  ondansetron (ZOFRAN) 4 MG tablet Take 4 mg by mouth See admin instructions. Take 4 mg by mouth every 4-6 hours as needed for nausea or vomiting 08/22/18  Yes [provider]  rOPINIRole (REQUIP) 1 MG tablet Take 1 mg by mouth See admin instructions. Take 1 mg by mouth once a day between 3 PM-4 PM and an additional 1 mg in the evening, if no relief 06/19/15  Yes [provider]  sennosides-docusate sodium (SENOKOT-S) 8.6-50 MG tablet Take 1 tablet by mouth daily. Patient taking differently: Take 1 tablet by mouth daily as needed for constipation.  04/13/15  Yes Barton Dubois, MD  spironolactone (ALDACTONE) 25 MG tablet TAKE 1 TABLET BY MOUTH EVERY DAY Patient taking differently: Take 25 mg by mouth daily.  07/25/16  Yes Dorothy Spark, MD  desmopressin (DDAVP) 0.2 MG tablet Take 0.2-0.4 mg by mouth at bedtime.  04/10/18   [provider]  Multiple Vitamin (MULTI-DAY VITAMINS PO) Take 1 tablet by mouth daily.     [provider]    Physical Exam: Vitals:   09/13/18 1515 09/13/18 1700 09/13/18 1730  BP: (!) 156/88 (!) 179/77 (!) 193/90  Pulse: (!) 59 (!) 57 61  Resp:  (!) 24 18  Temp: 97.8 F (36.6 C)    TempSrc: Oral    SpO2: 97% 99% 98%  Weight: 93 kg    Height: 5' 3.5" (1.613 m)        Constitutional: NAD, calm, comfortable Vitals:   09/13/18 1515 09/13/18 1700 09/13/18 1730  BP: (!) 156/88 (!) 179/77 (!) 193/90  Pulse: (!) 59 (!) 57 61  Resp:  (!) 24 18  Temp: 97.8 F (36.6 C)    TempSrc: Oral    SpO2: 97% 99% 98%  Weight: 93 kg    Height: 5' 3.5" (1.613 m)     Eyes: PERRL, lids and conjunctivae normal ENMT: Mucous membranes are moist. Posterior pharynx clear of any exudate or lesions.Normal dentition.  Neck: normal, supple, no masses, no thyromegaly Respiratory: clear to auscultation bilaterally, no wheezing, no  crackles. Normal respiratory effort. No accessory muscle use.  Cardiovascular: Regular rate and rhythm, no murmurs / rubs / gallops. No extremity edema. 2+ pedal pulses. No carotid bruits.  Abdomen: no tenderness, no masses palpated. No hepatosplenomegaly. Bowel sounds positive.  Musculoskeletal: no clubbing / cyanosis. No joint deformity upper and lower extremities. Good ROM, no contractures. Normal muscle tone.  Skin: no rashes, lesions, ulcers. No induration Neurologic: CN 2-12 grossly intact. Sensation intact, DTR normal. Strength 5/5 in all 4.  Psychiatric: Normal judgment and insight. Alert and oriented x 3. Normal mood.     Labs on Admission: I have personally reviewed following labs and imaging studies  CBC: Recent Labs  Lab 09/13/18 1521  WBC 7.3  HGB 11.7*  HCT 32.7*  MCV 85.4  PLT 272   Basic Metabolic Panel: Recent Labs  Lab 09/13/18 1521  NA 111*  K 4.8  CL 80*  CO2 23  GLUCOSE 111*  BUN 15  CREATININE 0.76  CALCIUM 8.7*   GFR: Estimated Creatinine Clearance: 58.4 mL/min (by C-G formula based on SCr of 0.76 mg/dL). Liver Function Tests: No results for input(s): AST, ALT, ALKPHOS, BILITOT, PROT, ALBUMIN in the last 168 hours. No results for input(s): LIPASE, AMYLASE in the last 168 hours. No results for input(s): AMMONIA in the last 168 hours. Coagulation Profile: No results for input(s): INR, PROTIME in the last 168 hours. Cardiac Enzymes: No results for input(s): CKTOTAL, CKMB, CKMBINDEX, TROPONINI in the last 168 hours. BNP (last 3 results) No results for input(s): PROBNP in the last 8760 hours. HbA1C: No results for input(s): HGBA1C in the last 72 hours. CBG: Recent Labs  Lab 09/13/18 1759  GLUCAP 100*   Lipid Profile: No results for input(s): CHOL, HDL, LDLCALC, TRIG, CHOLHDL, LDLDIRECT in the last 72 hours. Thyroid Function Tests: No results for input(s): TSH, T4TOTAL, FREET4, T3FREE, THYROIDAB in the last 72 hours. Anemia Panel: No  results for input(s): VITAMINB12, FOLATE, FERRITIN, TIBC, IRON, RETICCTPCT in the last 72 hours. Urine analysis:    Component Value  Date/Time   COLORURINE STRAW (A) 09/13/2018 1740   APPEARANCEUR CLEAR 09/13/2018 1740   LABSPEC 1.008 09/13/2018 1740   PHURINE 7.0 09/13/2018 1740   GLUCOSEU NEGATIVE 09/13/2018 1740   HGBUR NEGATIVE 09/13/2018 1740   BILIRUBINUR NEGATIVE 09/13/2018 1740   KETONESUR NEGATIVE 09/13/2018 1740   PROTEINUR NEGATIVE 09/13/2018 1740   UROBILINOGEN 0.2 01/06/2009 0808   NITRITE NEGATIVE 09/13/2018 1740   LEUKOCYTESUR NEGATIVE 09/13/2018 1740   Sepsis Labs: @LABRCNTIP (procalcitonin:4,lacticidven:4) ) Recent Results (from the past 240 hour(s))  SARS Coronavirus 2 (CEPHEID - Performed in [place] hospital lab), Hosp Order     Status: None   Collection Time: 09/13/18  4:52 PM  Result Value Ref Range Status   SARS Coronavirus 2 NEGATIVE NEGATIVE Final    Comment: (NOTE) If result is NEGATIVE SARS-CoV-2 target nucleic acids are NOT DETECTED. The SARS-CoV-2 RNA is generally detectable in upper and lower  respiratory specimens during the acute phase of infection. The lowest  concentration of SARS-CoV-2 viral copies this assay can detect is 250  copies / mL. A negative result does not preclude SARS-CoV-2 infection  and should not be used as the sole basis for treatment or other  patient management decisions.  A negative result may occur with  improper specimen collection / handling, submission of specimen other  than nasopharyngeal swab, presence of viral mutation(s) within the  areas targeted by this assay, and inadequate number of viral copies  (<250 copies / mL). A negative result must be combined with clinical  observations, patient history, and epidemiological information. If result is POSITIVE SARS-CoV-2 target nucleic acids are DETECTED. The SARS-CoV-2 RNA is generally detectable in upper and lower  respiratory specimens dur ing the acute phase of  infection.  Positive  results are indicative of active infection with SARS-CoV-2.  Clinical  correlation with patient history and other diagnostic information is  necessary to determine patient infection status.  Positive results do  not rule out bacterial infection or co-infection with other viruses. If result is PRESUMPTIVE POSTIVE SARS-CoV-2 nucleic acids MAY BE PRESENT.   A presumptive positive result was obtained on the submitted specimen  and confirmed on repeat testing.  While 2019 novel coronavirus  (SARS-CoV-2) nucleic acids may be present in the submitted sample  additional confirmatory testing may be necessary for epidemiological  and / or clinical management purposes  to differentiate between  SARS-CoV-2 and other Sarbecovirus currently known to infect humans.  If clinically indicated additional testing with an alternate test  methodology [phone]) is advised. The SARS-CoV-2 RNA is generally  detectable in upper and lower respiratory sp ecimens during the acute  phase of infection. The expected result is Negative. Fact Sheet for Patients:  StrictlyIdeas.no Fact Sheet for Healthcare Providers: BankingDealers.co.za This test is not yet approved or cleared by the [place] FDA and has been authorized for detection and/or diagnosis of SARS-CoV-2 by FDA under an Emergency Use Authorization (EUA).  This EUA will remain in effect (meaning this test can be used) for the duration of the COVID-19 declaration under Section 564(b)(1) of the Act, 21 U.S.C. section 360bbb-3(b)(1), unless the authorization is terminated or revoked sooner. Performed at [facility], [place] [address], [place], [place] [address]      Radiological Exams on Admission: Dg Chest Port 1 View  Result Date: 09/13/2018 CLINICAL DATA:  Weakness EXAM: PORTABLE CHEST 1 VIEW COMPARISON:  01/28/2016 FINDINGS: Artifact overlies the chest. Heart size upper limits  of normal. Chronic aortic atherosclerosis. No evidence  of heart failure. No infiltrate, collapse or effusion. Chronic degenerative changes affect the shoulders. IMPRESSION: No active disease.  Aortic atherosclerosis. Electronically Signed   By: Nelson Chimes M.D.   On: 09/13/2018 17:00    Assessment/Plan Principal Problem:   Dehydration with hyponatremia Active Problems:   Hypothyroidism   Benign essential HTN   Esophageal reflux   Chronic diastolic heart failure (HCC)   Bradycardia   Glaucoma     #1 hyponatremia: Patient has had previous episode of hyponatremia presumably due to medications.  Work-up at that time did not show SIADH.  We will admit the patient.  Asymptomatic so we will not do 3% saline since she is asymptomatic.  Gentle saline with free water restriction.  Check serum osmolality and urine osmolality.  #2 hypothyroidism: Check TSH and continue with levothyroxine  #3 hypertension: Blood pressure appears well controlled.  Continue home regimen.  #4 GERD: Continue with PPIs  #5 diastolic heart failure: We will hold Lasix.  Continue treatment  #6 bradycardia: Transient.  Monitor patient's heart rate.  Avoid bradycardic medications.  #7 glaucoma: Continue eyedrops  #8 restless leg syndrome: Continue home regimen   DVT prophylaxis: Heparin Code Status: Full code Family Communication: Family not readily available Disposition Plan: Home Consults called: May need nephrology consult Admission status: Inpatient  Severity of Illness: The appropriate patient status for this patient is INPATIENT. Inpatient status is judged to be reasonable and necessary in order to provide the required intensity of service to ensure the patient's safety. The patient's presenting symptoms, physical exam findings, and initial radiographic and laboratory data in the context of their chronic comorbidities is felt to place them at high risk for further clinical deterioration. Furthermore, it is  not anticipated that the patient will be medically stable for discharge from the hospital within 2 midnights of admission. The following factors support the patient status of inpatient.   " The patient's presenting symptoms include weakness. " The worrisome physical exam findings include generalized debility. " The initial radiographic and laboratory data are worrisome because of sodium of 111. " The chronic co-morbidities include hypertension.   * I certify that at the point of admission it is my clinical judgment that the patient will require inpatient hospital care spanning beyond 2 midnights from the point of admission due to high intensity of service, high risk for further deterioration and high frequency of surveillance required.Barbette Merino MD [facility] Pager [phone]  If 7PM-7AM, please contact night-coverage [email or web address] Password [facility]  09/13/2018, 6:35 PM

## 2018-09-13 NOTE — Progress Notes (Signed)
New Admission Note: ? Arrival Method: Stretcher Mental Orientation:AXOX4 Telemetry: Yes  Assessment: Completed Skin: Refer to flowsheet IV: LAC Pain: 9 orders received in epic Tubes: None Safety Measures: Safety Fall Prevention Plan discussed with patient. Admission: Completed 5 [place] Orientation: Patient has been orientated to the room, unit and the staff. Family: None at bedside Orders have been reviewed and are being implemented. Will continue to monitor the patient. Call light has been placed within reach and bed alarm has been activated.  ? Milagros Loll), RN  Phone Number: [phone]

## 2018-09-13 NOTE — ED Notes (Signed)
Na 111 called to this RN by lab. RN and Provider notified.

## 2018-09-13 NOTE — ED Triage Notes (Signed)
Pt transported by EMS, from home, she reports seeing her  PCP today with blood drawn.  Reports critical low NA level.  She reports gait problems x few days.  Denies dizziness, cp, or sob. She is A&O x 4.

## 2018-09-14 ENCOUNTER — Other Ambulatory Visit: Payer: Self-pay

## 2018-09-14 ENCOUNTER — Inpatient Hospital Stay (HOSPITAL_COMMUNITY): Payer: Medicare Other

## 2018-09-14 LAB — BASIC METABOLIC PANEL
Anion gap: 13 (ref 5–15)
Anion gap: 10 (ref 5–15)
Anion gap: 5 (ref 5–15)
BUN: 10 mg/dL (ref 8–23)
BUN: 11 mg/dL (ref 8–23)
BUN: 13 mg/dL (ref 8–23)
CO2: 23 mmol/L (ref 22–32)
CO2: 23 mmol/L (ref 22–32)
CO2: 28 mmol/L (ref 22–32)
Calcium: 9.2 mg/dL (ref 8.9–10.3)
Calcium: 9.1 mg/dL (ref 8.9–10.3)
Calcium: 9.1 mg/dL (ref 8.9–10.3)
Chloride: 84 mmol/L — ABNORMAL LOW (ref 98–111)
Chloride: 90 mmol/L — ABNORMAL LOW (ref 98–111)
Chloride: 91 mmol/L — ABNORMAL LOW (ref 98–111)
Creatinine, Ser: 0.79 mg/dL (ref 0.44–1.00)
Creatinine, Ser: 0.78 mg/dL (ref 0.44–1.00)
Creatinine, Ser: 0.86 mg/dL (ref 0.44–1.00)
GFR calc Af Amer: >60 mL/min (ref >60)
GFR calc Af Amer: >60 mL/min (ref >60)
GFR calc Af Amer: >60 mL/min (ref >60)
GFR calc non Af Amer: >60 mL/min (ref >60)
GFR calc non Af Amer: >60 mL/min (ref >60)
GFR calc non Af Amer: >60 mL/min (ref >60)
Glucose, Bld: 142 mg/dL — ABNORMAL HIGH (ref 70–99)
Glucose, Bld: 108 mg/dL — ABNORMAL HIGH (ref 70–99)
Glucose, Bld: 121 mg/dL — ABNORMAL HIGH (ref 70–99)
Potassium: 3.9 mmol/L (ref 3.5–5.1)
Potassium: 4 mmol/L (ref 3.5–5.1)
Potassium: 4.1 mmol/L (ref 3.5–5.1)
Sodium: 120 mmol/L — ABNORMAL LOW (ref 135–145)
Sodium: 123 mmol/L — ABNORMAL LOW (ref 135–145)
Sodium: 124 mmol/L — ABNORMAL LOW (ref 135–145)

## 2018-09-14 LAB — COMPREHENSIVE METABOLIC PANEL
ALT: 11 U/L (ref 0–44)
AST: 18 U/L (ref 15–41)
Albumin: 4.1 g/dL (ref 3.5–5.0)
Alkaline Phosphatase: 63 U/L (ref 38–126)
Anion gap: 11 (ref 5–15)
BUN: 11 mg/dL (ref 8–23)
CO2: 22 mmol/L (ref 22–32)
Calcium: 9.1 mg/dL (ref 8.9–10.3)
Chloride: 82 mmol/L — ABNORMAL LOW (ref 98–111)
Creatinine, Ser: 0.75 mg/dL (ref 0.44–1.00)
GFR calc Af Amer: >60 mL/min (ref >60)
GFR calc non Af Amer: >60 mL/min (ref >60)
Glucose, Bld: 105 mg/dL — ABNORMAL HIGH (ref 70–99)
Potassium: 4.6 mmol/L (ref 3.5–5.1)
Sodium: 115 mmol/L — CL (ref 135–145)
Total Bilirubin: 1.1 mg/dL (ref 0.3–1.2)
Total Protein: 7.1 g/dL (ref 6.5–8.1)

## 2018-09-14 LAB — CBC
HCT: 34.4 % — ABNORMAL LOW (ref 36.0–46.0)
Hemoglobin: 12.2 g/dL (ref 12.0–15.0)
MCH: 29.9 pg (ref 26.0–34.0)
MCHC: 35.5 g/dL (ref 30.0–36.0)
MCV: 84.3 fL (ref 80.0–100.0)
Platelets: 297 10*3/uL (ref 150–400)
RBC: 4.08 MIL/uL (ref 3.87–5.11)
RDW: 12.2 % (ref 11.5–15.5)
WBC: 8.3 10*3/uL (ref 4.0–10.5)
nRBC: 0 % (ref 0.0–0.2)

## 2018-09-14 LAB — OSMOLALITY: Osmolality: 236 mOsm/kg — CL (ref 275–295)

## 2018-09-14 LAB — OSMOLALITY, URINE: Osmolality, Ur: 266 mOsm/kg — ABNORMAL LOW (ref 300–900)

## 2018-09-14 MED ORDER — LORATADINE 10 MG PO TABS: 10.0000 mg | ORAL_TABLET | Freq: Every day | ORAL | Status: DC | PRN

## 2018-09-14 MED ORDER — MUSCLE RUB 10-15 % EX CREA: 1.0000 "application " | TOPICAL_CREAM | CUTANEOUS | Status: DC | PRN

## 2018-09-14 MED ORDER — SENNOSIDES-DOCUSATE SODIUM 8.6-50 MG PO TABS
2.0000 | ORAL_TABLET | Freq: Every evening | ORAL | Status: DC | PRN
  Administered 2018-09-16: 2 via ORAL
  Filled 2018-09-14: qty 2

## 2018-09-14 MED ORDER — HYDRALAZINE HCL 20 MG/ML IJ SOLN: 10.0000 mg | INTRAMUSCULAR | Status: DC | PRN

## 2018-09-14 MED ORDER — ALUM & MAG HYDROXIDE-SIMETH 200-200-20 MG/5ML PO SUSP: 30.0000 mL | ORAL | Status: DC | PRN

## 2018-09-14 MED ORDER — POLYVINYL ALCOHOL 1.4 % OP SOLN: 1.0000 [drp] | OPHTHALMIC | Status: DC | PRN

## 2018-09-14 MED ORDER — PHENOL 1.4 % MT LIQD: 1.0000 | OROMUCOSAL | Status: DC | PRN

## 2018-09-14 MED ORDER — MORPHINE SULFATE (PF) 2 MG/ML IV SOLN
2.0000 mg | INTRAVENOUS | Status: DC | PRN
  Administered 2018-09-14 – 2018-09-15 (×4): 2 mg via INTRAVENOUS
  Filled 2018-09-14 (×4): qty 1

## 2018-09-14 MED ORDER — LIP MEDEX EX OINT: 1.0000 "application " | TOPICAL_OINTMENT | CUTANEOUS | Status: DC | PRN

## 2018-09-14 MED ORDER — POLYETHYLENE GLYCOL 3350 17 G PO PACK: 17.0000 g | PACK | Freq: Every day | ORAL | Status: DC | PRN

## 2018-09-14 MED ORDER — HYDROCORTISONE (PERIANAL) 2.5 % EX CREA: 1.0000 "application " | TOPICAL_CREAM | Freq: Four times a day (QID) | CUTANEOUS | Status: DC | PRN

## 2018-09-14 MED ORDER — GUAIFENESIN-DM 100-10 MG/5ML PO SYRP: 5.0000 mL | ORAL_SOLUTION | ORAL | Status: DC | PRN

## 2018-09-14 MED ORDER — SALINE SPRAY 0.65 % NA SOLN: 1.0000 | NASAL | Status: DC | PRN

## 2018-09-14 MED ORDER — METOCLOPRAMIDE HCL 5 MG/ML IJ SOLN
10.0000 mg | Freq: Once | INTRAMUSCULAR | Status: AC
  Administered 2018-09-14: 10 mg via INTRAVENOUS
  Filled 2018-09-14: qty 2

## 2018-09-14 MED ORDER — HYDROCORTISONE 1 % EX CREA: 1.0000 "application " | TOPICAL_CREAM | Freq: Three times a day (TID) | CUTANEOUS | Status: DC | PRN

## 2018-09-14 NOTE — Progress Notes (Signed)
CRITICAL VALUE ALERT  Critical Value: Sodium level of 115  Date & Time Notied:  09/14/2018 at 0330  Provider Notified: Silas [place], NP notified  Orders Received/Actions taken: Awaiting for orders.

## 2018-09-14 NOTE — Progress Notes (Signed)
CRITICAL VALUE ALERT  Critical Value:  Osmolality level of 236  Date & Time Notied:  06/5/202020 at 0347  Provider Notified: Silas [place], NP notified  Orders Received/Actions taken: Awaiting for orders.

## 2018-09-14 NOTE — Progress Notes (Signed)
PROGRESS NOTE    Lisa Mcclure  JYN:[id number] DOB: 09/29/1932 DOA: 09/13/2018 PCP: [name], MD   Brief Narrative:  85 year old with history of diastolic CHF, restless leg, GERD, hypothyroidism, rheumatoid arthritis, essential hypertension came to the ER with complaints of generalized weakness.  Admitted with poor p.o. intake.  On admission she was found to be hyponatremic with sodium of 111.  She had clear signs of dehydration at the time of admission.   Assessment & Plan:   Principal Problem:   Dehydration with hyponatremia Active Problems:   Hypothyroidism   Benign essential HTN   Esophageal reflux   Chronic diastolic heart failure (HCC)   Bradycardia   Glaucoma  Severe hyponatremia, very slowly improving Moderate to severe dehydration - Admitted to the hospital with hydration, the following morning her sodium improved from 111 to 115. -At this time continue to hold any diuretics, gentle hydration.  BMP every 6 hours.  Monitor urine output.  Check urine electrolytes. - Avoid overcorrection of sodium.  Low back pain, acute on chronic Will get Xr pelvis and lumbar spine, pain meds plus aggressive bowel regimen.  PT/OT.  History of hypothyroidism -Continue Synthroid, check TSH levels.  Essential hypertension -Well-controlled.  Continue home regimen.  Chronic diastolic congestive heart failure, class I -Currently appears to be dehydrated.  Holding Lasix and getting gentle hydration.  Restless leg syndrome -Resume home meds  DVT prophylaxis: On heparin Code Status: Full code Family Communication:  Spoke with her daughter Lujean Rave. Disposition Plan: Maintain hosp stay until Na is better.   Consultants:   None  Procedures:   None  Antimicrobials:   None   Subjective: Still feels dizzy and dry mouth Also reports lower back pain more than usual.  Because of that she is having difficulty ambulating.  Review of Systems Otherwise negative except as  per HPI, including: General: Denies fever, chills, night sweats or unintended weight loss. Resp: Denies cough, wheezing, shortness of breath. Cardiac: Denies chest pain, palpitations, orthopnea, paroxysmal nocturnal dyspnea. GI: Denies abdominal pain, nausea, vomiting, diarrhea or constipation GU: Denies dysuria, frequency, hesitancy or incontinence MS: Low back pain Neuro: Denies headache, neurologic deficits (focal weakness, numbness, tingling), abnormal gait Psych: Denies anxiety, depression, SI/HI/AVH Skin: Denies new rashes or lesions ID: Denies sick contacts, exotic exposures, travel  Objective: Vitals:   09/13/18 1957 09/13/18 2100 09/14/18 0436 09/14/18 0630  BP: (!) 195/80 (!) 165/57 (!) 200/75 (!) 159/79  Pulse: 60 (!) 55 63 63  Resp: 18     Temp: 98 F (36.7 C) 97.6 F (36.4 C) 97.6 F (36.4 C)   TempSrc: Oral Oral Oral   SpO2: 96% 97% 96%   Weight:      Height:        Intake/Output Summary (Last 24 hours) at 09/14/2018 0736 Last data filed at 09/14/2018 0600 Gross per 24 hour  Intake 792.23 ml  Output 0 ml  Net 792.23 ml   Filed Weights   09/13/18 1515  Weight: 93 kg    Examination:  General exam: Appears calm and comfortable, dry mouth Respiratory system: Clear to auscultation. Respiratory effort normal. Cardiovascular system: S1 & S2 heard, RRR. No JVD, murmurs, rubs, gallops or clicks. No pedal edema. Gastrointestinal system: Abdomen is nondistended, soft and nontender. No organomegaly or masses felt. Normal bowel sounds heard. Central nervous system: Alert and oriented. No focal neurological deficits. Extremities: Symmetric 4 x 5 power. Skin: No rashes, lesions or ulcers Psychiatry: Judgement and insight appear normal. Mood & affect  appropriate.     Data Reviewed:   CBC: Recent Labs  Lab 09/13/18 1521 09/14/18 0149  WBC 7.3 8.3  HGB 11.7* 12.2  HCT 32.7* 34.4*  MCV 85.4 84.3  PLT 272 275   Basic Metabolic Panel: Recent Labs  Lab 09/13/18  1521 09/14/18 0149  NA 111* 115*  K 4.8 4.6  CL 80* 82*  CO2 23 22  GLUCOSE 111* 105*  BUN 15 11  CREATININE 0.76 0.75  CALCIUM 8.7* 9.1   GFR: Estimated Creatinine Clearance: 58.4 mL/min (by C-G formula based on SCr of 0.75 mg/dL). Liver Function Tests: Recent Labs  Lab 09/14/18 0149  AST 18  ALT 11  ALKPHOS 63  BILITOT 1.1  PROT 7.1  ALBUMIN 4.1   No results for input(s): LIPASE, AMYLASE in the last 168 hours. No results for input(s): AMMONIA in the last 168 hours. Coagulation Profile: No results for input(s): INR, PROTIME in the last 168 hours. Cardiac Enzymes: No results for input(s): CKTOTAL, CKMB, CKMBINDEX, TROPONINI in the last 168 hours. BNP (last 3 results) No results for input(s): PROBNP in the last 8760 hours. HbA1C: No results for input(s): HGBA1C in the last 72 hours. CBG: Recent Labs  Lab 09/13/18 1759  GLUCAP 100*   Lipid Profile: No results for input(s): CHOL, HDL, LDLCALC, TRIG, CHOLHDL, LDLDIRECT in the last 72 hours. Thyroid Function Tests: No results for input(s): TSH, T4TOTAL, FREET4, T3FREE, THYROIDAB in the last 72 hours. Anemia Panel: No results for input(s): VITAMINB12, FOLATE, FERRITIN, TIBC, IRON, RETICCTPCT in the last 72 hours. Sepsis Labs: No results for input(s): PROCALCITON, LATICACIDVEN in the last 168 hours.  Recent Results (from the past 240 hour(s))  SARS Coronavirus 2 (CEPHEID - Performed in [place] hospital lab), Hosp Order     Status: None   Collection Time: 09/13/18  4:52 PM  Result Value Ref Range Status   SARS Coronavirus 2 NEGATIVE NEGATIVE Final    Comment: (NOTE) If result is NEGATIVE SARS-CoV-2 target nucleic acids are NOT DETECTED. The SARS-CoV-2 RNA is generally detectable in upper and lower  respiratory specimens during the acute phase of infection. The lowest  concentration of SARS-CoV-2 viral copies this assay can detect is 250  copies / mL. A negative result does not preclude SARS-CoV-2 infection   and should not be used as the sole basis for treatment or other  patient management decisions.  A negative result may occur with  improper specimen collection / handling, submission of specimen other  than nasopharyngeal swab, presence of viral mutation(s) within the  areas targeted by this assay, and inadequate number of viral copies  (<250 copies / mL). A negative result must be combined with clinical  observations, patient history, and epidemiological information. If result is POSITIVE SARS-CoV-2 target nucleic acids are DETECTED. The SARS-CoV-2 RNA is generally detectable in upper and lower  respiratory specimens dur ing the acute phase of infection.  Positive  results are indicative of active infection with SARS-CoV-2.  Clinical  correlation with patient history and other diagnostic information is  necessary to determine patient infection status.  Positive results do  not rule out bacterial infection or co-infection with other viruses. If result is PRESUMPTIVE POSTIVE SARS-CoV-2 nucleic acids MAY BE PRESENT.   A presumptive positive result was obtained on the submitted specimen  and confirmed on repeat testing.  While 2019 novel coronavirus  (SARS-CoV-2) nucleic acids may be present in the submitted sample  additional confirmatory testing may be necessary for epidemiological  and /  or clinical management purposes  to differentiate between  SARS-CoV-2 and other Sarbecovirus currently known to infect humans.  If clinically indicated additional testing with an alternate test  methodology [phone]) is advised. The SARS-CoV-2 RNA is generally  detectable in upper and lower respiratory sp ecimens during the acute  phase of infection. The expected result is Negative. Fact Sheet for Patients:  StrictlyIdeas.no Fact Sheet for Healthcare Providers: BankingDealers.co.za This test is not yet approved or cleared by the [place] FDA and has  been authorized for detection and/or diagnosis of SARS-CoV-2 by FDA under an Emergency Use Authorization (EUA).  This EUA will remain in effect (meaning this test can be used) for the duration of the COVID-19 declaration under Section 564(b)(1) of the Act, 21 U.S.C. section 360bbb-3(b)(1), unless the authorization is terminated or revoked sooner. Performed at [facility], [place] [address], [place], [place] [address]          Radiology Studies: Dg Chest Port 1 View  Result Date: 09/13/2018 CLINICAL DATA:  Weakness EXAM: PORTABLE CHEST 1 VIEW COMPARISON:  01/28/2016 FINDINGS: Artifact overlies the chest. Heart size upper limits of normal. Chronic aortic atherosclerosis. No evidence of heart failure. No infiltrate, collapse or effusion. Chronic degenerative changes affect the shoulders. IMPRESSION: No active disease.  Aortic atherosclerosis. Electronically Signed   By: Nelson Chimes M.D.   On: 09/13/2018 17:00        Scheduled Meds: . aspirin EC  81 mg Oral Daily  . carvedilol  25 mg Oral BID  . cholecalciferol  5,000 Units Oral Q breakfast  . enoxaparin (LOVENOX) injection  40 mg Subcutaneous Q24H  . hydrALAZINE  25 mg Oral BID  . latanoprost  1 drop Both Eyes QHS  . levothyroxine  100 mcg Oral QAC breakfast  . losartan  100 mg Oral BID  . multivitamin  1 tablet Oral BID  . pantoprazole  40 mg Oral Daily  . rOPINIRole  1 mg Oral Daily  . sodium chloride flush  3 mL Intravenous Once   Continuous Infusions: . sodium chloride 100 mL/hr at 09/13/18 2211     LOS: 1 day   Time spent= 40 mins    Deshay Kirstein Arsenio Loader, MD [facility]  If 7PM-7AM, please contact night-coverage [email or web address] 09/14/2018, 7:36 AM

## 2018-09-14 NOTE — Evaluation (Signed)
Physical Therapy Evaluation Patient Details Name: Lisa Mcclure MRN: [id number] DOB: 05-Feb-1933 Today's Date: 09/14/2018   History of Present Illness  Pt is an 85 y/o female admitted secondary to dehydration and hyponatremia. PMH includes HTN, dCHF, glaucoma, and RA.   Clinical Impression  Pt admitted secondary to problem above with deficits below. Pt requiring min to mod A for functional mobility tasks using RW. Reports she has had difficulty caring for herself at home and feel she will need increased assist at d/c. Will continue to follow acutely to maximize functional mobility independence and safety.     Follow Up Recommendations SNF    Equipment Recommendations  None recommended by PT    Recommendations for Other Services       Precautions / Restrictions Precautions Precautions: Fall Restrictions Weight Bearing Restrictions: No      Mobility  Bed Mobility               General bed mobility comments: Sitting EOB upon entry.   Transfers Overall transfer level: Needs assistance Equipment used: Rolling walker (2 wheeled) Transfers: Sit to/from Stand Sit to Stand: Mod assist         General transfer comment: Mod A for lift assist and steadying assist to stand. Required 2 attempts to stand using RW.   Ambulation/Gait Ambulation/Gait assistance: Min assist Gait Distance (Feet): 3 Feet Assistive device: Rolling walker (2 wheeled) Gait Pattern/deviations: Step-through pattern;Decreased stride length Gait velocity: Decreased    General Gait Details: Slow, unsteady gait. Required min A for steadying assist to get to the chair. Pt with incontinence during short distance ambulation so further mobility deferred.   Stairs            Wheelchair Mobility    Modified Rankin (Stroke Patients Only)       Balance Overall balance assessment: Needs assistance Sitting-balance support: No upper extremity supported;Feet supported Sitting balance-Leahy Scale:  Fair     Standing balance support: Bilateral upper extremity supported;During functional activity Standing balance-Leahy Scale: Poor Standing balance comment: Reliant on BUE support                              Pertinent Vitals/Pain Pain Assessment: Faces Faces Pain Scale: Hurts little more Pain Location: back Pain Descriptors / Indicators: Aching Pain Intervention(s): Limited activity within patient's tolerance;Monitored during session;Repositioned    Home Living Family/patient expects to be discharged to:: Private residence Living Arrangements: Alone Available Help at Discharge: Family;Available PRN/intermittently Type of Home: House Home Access: Level entry     Home Layout: One level Home Equipment: Walker - 2 wheels;Grab bars - tub/shower      Prior Function Level of Independence: Independent with assistive device(s)         Comments: Reports she was using RW, however, over the past 2 days has been unable to care for herself.      Hand Dominance        Extremity/Trunk Assessment   Upper Extremity Assessment Upper Extremity Assessment: Defer to OT evaluation    Lower Extremity Assessment Lower Extremity Assessment: Generalized weakness    Cervical / Trunk Assessment Cervical / Trunk Assessment: Other exceptions Cervical / Trunk Exceptions: back pain   Communication   Communication: No difficulties  Cognition Arousal/Alertness: Awake/alert Behavior During Therapy: WFL for tasks assessed/performed Overall Cognitive Status: No family/caregiver present to determine baseline cognitive functioning  General Comments General comments (skin integrity, edema, etc.): Pt reports being unable to care for herself at home     Exercises     Assessment/Plan    PT Assessment Patient needs continued PT services  PT Problem List Decreased strength;Decreased balance;Decreased mobility;Decreased  knowledge of use of DME;Decreased safety awareness;Decreased knowledge of precautions;Pain       PT Treatment Interventions DME instruction;Gait training;Functional mobility training;Therapeutic activities;Therapeutic exercise;Balance training;Patient/family education    PT Goals (Current goals can be found in the Care Plan section)  Acute Rehab PT Goals Patient Stated Goal: to get comfortable PT Goal Formulation: With patient Time For Goal Achievement: 09/28/18 Potential to Achieve Goals: Good    Frequency Min 2X/week   Barriers to discharge        Co-evaluation               AM-PAC PT "6 Clicks" Mobility  Outcome Measure Help needed turning from your back to your side while in a flat bed without using bedrails?: A Little Help needed moving from lying on your back to sitting on the side of a flat bed without using bedrails?: A Little Help needed moving to and from a bed to a chair (including a wheelchair)?: A Little Help needed standing up from a chair using your arms (e.g., wheelchair or bedside chair)?: A Lot Help needed to walk in hospital room?: A Lot Help needed climbing 3-5 steps with a railing? : A Lot 6 Click Score: 15    End of Session Equipment Utilized During Treatment: Gait belt Activity Tolerance: Patient tolerated treatment well Patient left: in chair;with call bell/phone within reach Nurse Communication: Mobility status PT Visit Diagnosis: Unsteadiness on feet (R26.81);Muscle weakness (generalized) (M62.81)    Time: 4696-2952 PT Time Calculation (min) (ACUTE ONLY): 15 min   Charges:   PT Evaluation $PT Eval Low Complexity: Allyn, PT, DPT  Acute Rehabilitation Services  Pager: [phone] Office: [phone]   Rudean Hitt 09/14/2018, 5:11 PM

## 2018-09-15 LAB — BASIC METABOLIC PANEL
Anion gap: 12 (ref 5–15)
Anion gap: 9 (ref 5–15)
Anion gap: 11 (ref 5–15)
BUN: 12 mg/dL (ref 8–23)
BUN: 11 mg/dL (ref 8–23)
BUN: 14 mg/dL (ref 8–23)
CO2: 22 mmol/L (ref 22–32)
CO2: 24 mmol/L (ref 22–32)
CO2: 20 mmol/L — ABNORMAL LOW (ref 22–32)
Calcium: 8.8 mg/dL — ABNORMAL LOW (ref 8.9–10.3)
Calcium: 8.8 mg/dL — ABNORMAL LOW (ref 8.9–10.3)
Calcium: 8.6 mg/dL — ABNORMAL LOW (ref 8.9–10.3)
Chloride: 96 mmol/L — ABNORMAL LOW (ref 98–111)
Chloride: 99 mmol/L (ref 98–111)
Chloride: 98 mmol/L (ref 98–111)
Creatinine, Ser: 0.9 mg/dL (ref 0.44–1.00)
Creatinine, Ser: 0.86 mg/dL (ref 0.44–1.00)
Creatinine, Ser: 0.81 mg/dL (ref 0.44–1.00)
GFR calc Af Amer: >60 mL/min (ref >60)
GFR calc Af Amer: >60 mL/min (ref >60)
GFR calc Af Amer: >60 mL/min (ref >60)
GFR calc non Af Amer: 59 mL/min — ABNORMAL LOW (ref >60)
GFR calc non Af Amer: >60 mL/min (ref >60)
GFR calc non Af Amer: >60 mL/min (ref >60)
Glucose, Bld: 97 mg/dL (ref 70–99)
Glucose, Bld: 98 mg/dL (ref 70–99)
Glucose, Bld: 131 mg/dL — ABNORMAL HIGH (ref 70–99)
Potassium: 4.8 mmol/L (ref 3.5–5.1)
Potassium: 4.7 mmol/L (ref 3.5–5.1)
Potassium: 4.6 mmol/L (ref 3.5–5.1)
Sodium: 130 mmol/L — ABNORMAL LOW (ref 135–145)
Sodium: 132 mmol/L — ABNORMAL LOW (ref 135–145)
Sodium: 129 mmol/L — ABNORMAL LOW (ref 135–145)

## 2018-09-15 LAB — SODIUM, URINE, RANDOM: Sodium, Ur: 36 mmol/L

## 2018-09-15 LAB — OSMOLALITY, URINE: Osmolality, Ur: 185 mOsm/kg — ABNORMAL LOW (ref 300–900)

## 2018-09-15 LAB — OSMOLALITY: Osmolality: 274 mOsm/kg — ABNORMAL LOW (ref 275–295)

## 2018-09-15 LAB — MAGNESIUM: Magnesium: 1.8 mg/dL (ref 1.7–2.4)

## 2018-09-15 LAB — CORTISOL: Cortisol, Plasma: 16.8 ug/dL

## 2018-09-15 NOTE — Progress Notes (Addendum)
O2 sats wrong, fingers were cold. New reading is 99 percent on RA.  Eleanora Neighbor, RN

## 2018-09-15 NOTE — Evaluation (Signed)
Occupational Therapy Evaluation Patient Details Name: Lisa Mcclure MRN: [id number] DOB: 30-Mar-1933 Today's Date: 09/15/2018    History of Present Illness Pt is an 85 y/o female admitted secondary to dehydration and hyponatremia. PMH includes HTN, dCHF, glaucoma, and RA.    Clinical Impression   Pt PTA: living alone with very involved caregiver at least 12 hours weekly and biweekly housecleaning. Pt reports independent with ADL and mobility. Pt currently performing UB ADL with minA and LB ADL with modA. Pt performing short mobility with RW and transfers with minA. Pt unable to properly care for self due to increased pain, poor activity tolerance and limited mobility. OT to follow acutely.      Follow Up Recommendations  SNF;Supervision/Assistance - 24 hour    Equipment Recommendations  None recommended by OT    Recommendations for Other Services       Precautions / Restrictions Precautions Precautions: Fall Restrictions Weight Bearing Restrictions: No      Mobility Bed Mobility Overal bed mobility: Needs Assistance             General bed mobility comments: sitting in recliner  Transfers Overall transfer level: Needs assistance Equipment used: Rolling walker (2 wheeled) Transfers: Sit to/from Stand Sit to Stand: Min assist         General transfer comment: minA for power up from recliner    Balance Overall balance assessment: Needs assistance Sitting-balance support: No upper extremity supported;Feet supported Sitting balance-Leahy Scale: Fair     Standing balance support: Bilateral upper extremity supported;During functional activity Standing balance-Leahy Scale: Poor Standing balance comment: Reliant on BUE support                            ADL either performed or assessed with clinical judgement   ADL Overall ADL's : Needs assistance/impaired Eating/Feeding: Set up;Sitting   Grooming: Set up;Sitting   Upper Body Bathing: Minimal  assistance;Sitting   Lower Body Bathing: Moderate assistance;Sitting/lateral leans;Sit to/from stand;Cueing for sequencing   Upper Body Dressing : Minimal assistance;Sitting   Lower Body Dressing: Moderate assistance;Sitting/lateral leans;Sit to/from stand   Toilet Transfer: Minimal assistance;BSC   Toileting- Clothing Manipulation and Hygiene: Moderate assistance;Sitting/lateral lean;Sit to/from stand;Cueing for safety       Functional mobility during ADLs: Min guard;Rolling walker General ADL Comments: Pt performing bathing routine with NT and required minA for UB ADL and ModA for LB ADL. Pt with urine incontinence     Vision Baseline Vision/History: Wears glasses Wears Glasses: Reading only Vision Assessment?: No apparent visual deficits     Perception     Praxis      Pertinent Vitals/Pain Pain Assessment: Faces Faces Pain Scale: Hurts little more Pain Location: back Pain Descriptors / Indicators: Aching Pain Intervention(s): Monitored during session     Hand Dominance Right   Extremity/Trunk Assessment Upper Extremity Assessment Upper Extremity Assessment: Generalized weakness;RUE deficits/detail;LUE deficits/detail RUE Deficits / Details: frozen shoulder <45* of AROM FF RUE Coordination: decreased gross motor LUE Deficits / Details: frozen shoulder <45* of AROM FF LUE Coordination: decreased gross motor   Lower Extremity Assessment Lower Extremity Assessment: Generalized weakness   Cervical / Trunk Assessment Cervical / Trunk Assessment: Other exceptions Cervical / Trunk Exceptions: back pain    Communication Communication Communication: No difficulties   Cognition Arousal/Alertness: Awake/alert Behavior During Therapy: WFL for tasks assessed/performed Overall Cognitive Status: No family/caregiver present to determine baseline cognitive functioning  General Comments  Pt reporting that she has a  caregiver that works as needed, but cannot be 24/7 who assists with all IADL.     Exercises     Shoulder Instructions      Home Living Family/patient expects to be discharged to:: Private residence Living Arrangements: Alone Available Help at Discharge: Family;Available PRN/intermittently Type of Home: House Home Access: Level entry     Home Layout: One level     Bathroom Shower/Tub: Teacher, early years/pre: Handicapped height     Home Equipment: Environmental consultant - 2 wheels;Grab bars - tub/shower          Prior Functioning/Environment Level of Independence: Independent with assistive device(s)        Comments: Reports she was using RW, however, over the past 2 days has been unable to care for herself.         OT Problem List: Decreased strength;Decreased activity tolerance;Impaired balance (sitting and/or standing);Decreased coordination;Decreased safety awareness;Pain      OT Treatment/Interventions: Self-care/ADL training;Therapeutic exercise;Neuromuscular education;Energy conservation;DME and/or AE instruction;Therapeutic activities;Patient/family education;Balance training    OT Goals(Current goals can be found in the care plan section) Acute Rehab OT Goals Patient Stated Goal: to get comfortable OT Goal Formulation: With patient Time For Goal Achievement: 09/29/18 Potential to Achieve Goals: Good ADL Goals Pt Will Perform Grooming: with modified independence;standing Pt Will Perform Upper Body Dressing: with modified independence;standing Pt Will Perform Lower Body Dressing: with modified independence;sit to/from stand Pt Will Perform Toileting - Clothing Manipulation and hygiene: with modified independence;sit to/from stand Additional ADL Goal #1: Pt to be modified independent with OOB ADL with fair balance  OT Frequency: Min 2X/week   Barriers to D/C: Decreased caregiver support  reports no one can support 24/7       Co-evaluation               AM-PAC OT "6 Clicks" Daily Activity     Outcome Measure Help from another person eating meals?: None Help from another person taking care of personal grooming?: A Little Help from another person toileting, which includes using toliet, bedpan, or urinal?: A Lot Help from another person bathing (including washing, rinsing, drying)?: A Lot Help from another person to put on and taking off regular upper body clothing?: A Little Help from another person to put on and taking off regular lower body clothing?: A Lot 6 Click Score: 16   End of Session Equipment Utilized During Treatment: Rolling walker Nurse Communication: Mobility status  Activity Tolerance: Patient tolerated treatment well Patient left: in chair;with call bell/phone within reach;with chair alarm set  OT Visit Diagnosis: Unsteadiness on feet (R26.81);Muscle weakness (generalized) (M62.81)                Time: 8250-5397 OT Time Calculation (min): 24 min Charges:  OT General Charges $OT Visit: 1 Visit OT Evaluation $OT Eval Moderate Complexity: 1 Mod OT Treatments $Self Care/Home Management : 8-22 mins  Ebony Hail Harold Hedge) Marsa Aris OTR/L Acute Rehabilitation Services Pager: [phone] Office: [place] 09/15/2018, 4:01 PM

## 2018-09-15 NOTE — Progress Notes (Signed)
Messaged NP on call for prn oxygen. Pt is sating at 88 percent on RA.   Eleanora Neighbor, RN

## 2018-09-15 NOTE — Progress Notes (Signed)
PROGRESS NOTE    Lisa Mcclure  ZDG:[id number] DOB: July 28, 1932 DOA: 09/13/2018 PCP: [name], MD   Brief Narrative:  85 year old with history of diastolic CHF, restless leg, GERD, hypothyroidism, rheumatoid arthritis, essential hypertension came to the ER with complaints of generalized weakness.  Admitted with poor p.o. intake.  On admission she was found to be hyponatremic with sodium of 111.  She had clear signs of dehydration at the time of admission.  09/15/2018: Patient seen.  Sodium done today is 129.  Patient seems adequately rehydrated.    Will check urine sodium, urine osmolality and plasma osmolality as well as cortisol level.  Further management will depend on above.  PT OT and PT is appreciated, for skilled nursing facility placement.  Back pain is controlled with analgesia.  Lumbar and hip x-ray results reviewed with patient   Assessment & Plan:   Principal Problem:   Dehydration with hyponatremia Active Problems:   Hypothyroidism   Benign essential HTN   Esophageal reflux   Chronic diastolic heart failure (HCC)   Bradycardia   Glaucoma  Severe hyponatremia, very slowly improving Moderate to severe dehydration - Admitted to the hospital with hydration, the following morning her sodium improved from 111 to 115. -At this time continue to hold any diuretics, gentle hydration.  BMP every 6 hours.  Monitor urine output.  Check urine electrolytes. - Avoid overcorrection of sodium. 09/15/2018: Repeat urine sodium, urine osmolality and plasma osmolality.  Check cortisol level.  Low back pain, acute on chronic Will get Xr pelvis and lumbar spine, pain meds plus aggressive bowel regimen.  PT/OT. 09/15/2018: Adequately controlled with analgesics.  Low threshold to proceed with MRI if needed.  History of hypothyroidism -Continue Synthroid, check TSH levels.  Essential hypertension -Well-controlled.  Continue home regimen.  Chronic diastolic congestive heart failure,  class I -Currently appears to be dehydrated.  Holding Lasix and getting gentle hydration.  Restless leg syndrome -Resume home meds  DVT prophylaxis: On heparin Code Status: Full code Family Communication:  Spoke with her daughter Lujean Rave. Disposition Plan: Maintain hosp stay until Na is better.   Consultants:   None  Procedures:   None  Antimicrobials:   None   Subjective: Back pain is better controlled. No new complaints..  Objective: Vitals:   09/14/18 2107 09/14/18 2305 09/15/18 0519 09/15/18 0955  BP: (!) 131/59  (!) 146/64 (!) 123/57  Pulse: (!) 58 (!) 58 (!) 58 79  Resp: 18  18   Temp: 97.6 F (36.4 C)  (!) 97.3 F (36.3 C) 98.4 F (36.9 C)  TempSrc: Oral  Oral Oral  SpO2: 100% 93% 97% (!) 77%  Weight: 92.7 kg     Height:        Intake/Output Summary (Last 24 hours) at 09/15/2018 1451 Last data filed at 09/15/2018 1346 Gross per 24 hour  Intake 3143.74 ml  Output 3500 ml  Net -356.26 ml   Filed Weights   09/13/18 1515 09/14/18 2107  Weight: 93 kg 92.7 kg    Examination:  General exam: Appears calm and comfortable, dry mouth Respiratory system: Clear to auscultation. Respiratory effort normal. Cardiovascular system: S1 & S2  Gastrointestinal system: Abdomen is obese, soft and nontender.  Central nervous system: Alert and oriented. No focal neurological deficits. Extremities: Minimal leg edema.  Data Reviewed:   CBC: Recent Labs  Lab 09/13/18 1521 09/14/18 0149  WBC 7.3 8.3  HGB 11.7* 12.2  HCT 32.7* 34.4*  MCV 85.4 84.3  PLT 272 297  Basic Metabolic Panel: Recent Labs  Lab 09/14/18 1323 09/14/18 1929 09/15/18 0114 09/15/18 0722 09/15/18 1344  NA 123* 124* 130* 132* 129*  K 4.0 4.1 4.8 4.7 4.6  CL 90* 91* 96* 99 98  CO2 23 28 22 24  20*  GLUCOSE 108* 121* 97 98 131*  BUN 11 13 12 11 14   CREATININE 0.78 0.86 0.90 0.86 0.81  CALCIUM 9.1 9.1 8.8* 8.8* 8.6*  MG  --   --  1.8  --   --    GFR: Estimated Creatinine Clearance:  57.5 mL/min (by C-G formula based on SCr of 0.81 mg/dL). Liver Function Tests: Recent Labs  Lab 09/14/18 0149  AST 18  ALT 11  ALKPHOS 63  BILITOT 1.1  PROT 7.1  ALBUMIN 4.1   No results for input(s): LIPASE, AMYLASE in the last 168 hours. No results for input(s): AMMONIA in the last 168 hours. Coagulation Profile: No results for input(s): INR, PROTIME in the last 168 hours. Cardiac Enzymes: No results for input(s): CKTOTAL, CKMB, CKMBINDEX, TROPONINI in the last 168 hours. BNP (last 3 results) No results for input(s): PROBNP in the last 8760 hours. HbA1C: No results for input(s): HGBA1C in the last 72 hours. CBG: Recent Labs  Lab 09/13/18 1759  GLUCAP 100*   Lipid Profile: No results for input(s): CHOL, HDL, LDLCALC, TRIG, CHOLHDL, LDLDIRECT in the last 72 hours. Thyroid Function Tests: No results for input(s): TSH, T4TOTAL, FREET4, T3FREE, THYROIDAB in the last 72 hours. Anemia Panel: No results for input(s): VITAMINB12, FOLATE, FERRITIN, TIBC, IRON, RETICCTPCT in the last 72 hours. Sepsis Labs: No results for input(s): PROCALCITON, LATICACIDVEN in the last 168 hours.  Recent Results (from the past 240 hour(s))  SARS Coronavirus 2 (CEPHEID - Performed in [place] hospital lab), Hosp Order     Status: None   Collection Time: 09/13/18  4:52 PM  Result Value Ref Range Status   SARS Coronavirus 2 NEGATIVE NEGATIVE Final    Comment: (NOTE) If result is NEGATIVE SARS-CoV-2 target nucleic acids are NOT DETECTED. The SARS-CoV-2 RNA is generally detectable in upper and lower  respiratory specimens during the acute phase of infection. The lowest  concentration of SARS-CoV-2 viral copies this assay can detect is 250  copies / mL. A negative result does not preclude SARS-CoV-2 infection  and should not be used as the sole basis for treatment or other  patient management decisions.  A negative result may occur with  improper specimen collection / handling, submission of  specimen other  than nasopharyngeal swab, presence of viral mutation(s) within the  areas targeted by this assay, and inadequate number of viral copies  (<250 copies / mL). A negative result must be combined with clinical  observations, patient history, and epidemiological information. If result is POSITIVE SARS-CoV-2 target nucleic acids are DETECTED. The SARS-CoV-2 RNA is generally detectable in upper and lower  respiratory specimens dur ing the acute phase of infection.  Positive  results are indicative of active infection with SARS-CoV-2.  Clinical  correlation with patient history and other diagnostic information is  necessary to determine patient infection status.  Positive results do  not rule out bacterial infection or co-infection with other viruses. If result is PRESUMPTIVE POSTIVE SARS-CoV-2 nucleic acids MAY BE PRESENT.   A presumptive positive result was obtained on the submitted specimen  and confirmed on repeat testing.  While 2019 novel coronavirus  (SARS-CoV-2) nucleic acids may be present in the submitted sample  additional confirmatory testing may be  necessary for epidemiological  and / or clinical management purposes  to differentiate between  SARS-CoV-2 and other Sarbecovirus currently known to infect humans.  If clinically indicated additional testing with an alternate test  methodology [phone]) is advised. The SARS-CoV-2 RNA is generally  detectable in upper and lower respiratory sp ecimens during the acute  phase of infection. The expected result is Negative. Fact Sheet for Patients:  StrictlyIdeas.no Fact Sheet for Healthcare Providers: BankingDealers.co.za This test is not yet approved or cleared by the [place] FDA and has been authorized for detection and/or diagnosis of SARS-CoV-2 by FDA under an Emergency Use Authorization (EUA).  This EUA will remain in effect (meaning this test can be used) for the  duration of the COVID-19 declaration under Section 564(b)(1) of the Act, 21 U.S.C. section 360bbb-3(b)(1), unless the authorization is terminated or revoked sooner. Performed at [facility], [place] [address] [place], [place] [address]          Radiology Studies: Dg Lumbar Spine Complete  Result Date: 09/14/2018 CLINICAL DATA:  Back pain EXAM: LUMBAR SPINE - COMPLETE 4+ VIEW COMPARISON:  Chest x-ray dated 03/05/2017. FINDINGS: There is an age-indeterminate compression fracture of the T12 and T11 vertebral bodies. There is some mild height loss of L2 vertebral body. Degenerative changes are noted throughout the visualized lumbar spine. These are greatest at the lower lumbar segments were there is multilevel facet arthrosis and significant multilevel disc height loss. There appears to be a grade 1-2 anterolisthesis of L5 on S1. Diffuse osteopenia is noted. IMPRESSION: 1. Age-indeterminate height loss of the T11, T12, and L2 vertebral bodies. Correlation with point tenderness is recommended. If there is clinical concern for an acute fracture this can be further evaluated with cross-sectional imaging. 2. Multilevel degenerative changes throughout the lumbar spine. 3. Grade 1-2 anterolisthesis of L5 on S1. 4. Osteopenia. Electronically Signed   By: Constance Holster M.D.   On: 09/14/2018 19:25   Dg Pelvis 1-2 Views  Result Date: 09/14/2018 CLINICAL DATA:  Low back pain with osteoarthritis. EXAM: PELVIS - 1-2 VIEW COMPARISON:  None. FINDINGS: There is no acute displaced fracture or dislocation. There is advanced osteoarthritis of the right hip. There is mild-to-moderate osteoarthritis of the left hip. There are degenerative changes of the pubic symphysis. There is osteopenia. IMPRESSION: 1. No acute displaced fracture or dislocation. 2. Advanced osteoarthritis of the right hip. 3. Mild-to-moderate osteoarthritis of the left hip. 4. Osteopenia which limits detection of nondisplaced fractures.  Electronically Signed   By: Constance Holster M.D.   On: 09/14/2018 19:20   Dg Chest Port 1 View  Result Date: 09/13/2018 CLINICAL DATA:  Weakness EXAM: PORTABLE CHEST 1 VIEW COMPARISON:  01/28/2016 FINDINGS: Artifact overlies the chest. Heart size upper limits of normal. Chronic aortic atherosclerosis. No evidence of heart failure. No infiltrate, collapse or effusion. Chronic degenerative changes affect the shoulders. IMPRESSION: No active disease.  Aortic atherosclerosis. Electronically Signed   By: Nelson Chimes M.D.   On: 09/13/2018 17:00        Scheduled Meds: . aspirin EC  81 mg Oral Daily  . carvedilol  25 mg Oral BID  . cholecalciferol  5,000 Units Oral Q breakfast  . enoxaparin (LOVENOX) injection  40 mg Subcutaneous Q24H  . hydrALAZINE  25 mg Oral BID  . latanoprost  1 drop Both Eyes QHS  . levothyroxine  100 mcg Oral QAC breakfast  . losartan  100 mg Oral BID  . multivitamin  1 tablet Oral  BID  . pantoprazole  40 mg Oral Daily  . rOPINIRole  1 mg Oral Daily  . sodium chloride flush  3 mL Intravenous Once   Continuous Infusions: . sodium chloride 100 mL/hr at 09/15/18 0700     LOS: 2 days   Time spent= 35 Mins   Bonnell Public, MD [facility]  If 7PM-7AM, please contact night-coverage [email or web address] 09/15/2018, 2:51 PM

## 2018-09-16 LAB — RENAL FUNCTION PANEL
Albumin: 3.1 g/dL — ABNORMAL LOW (ref 3.5–5.0)
Anion gap: 7 (ref 5–15)
BUN: 14 mg/dL (ref 8–23)
CO2: 24 mmol/L (ref 22–32)
Calcium: 8.8 mg/dL — ABNORMAL LOW (ref 8.9–10.3)
Chloride: 102 mmol/L (ref 98–111)
Creatinine, Ser: 0.78 mg/dL (ref 0.44–1.00)
GFR calc Af Amer: >60 mL/min (ref >60)
GFR calc non Af Amer: >60 mL/min (ref >60)
Glucose, Bld: 88 mg/dL (ref 70–99)
Phosphorus: 3.9 mg/dL (ref 2.5–4.6)
Potassium: 4.4 mmol/L (ref 3.5–5.1)
Sodium: 133 mmol/L — ABNORMAL LOW (ref 135–145)

## 2018-09-16 LAB — MAGNESIUM: Magnesium: 1.7 mg/dL (ref 1.7–2.4)

## 2018-09-16 NOTE — TOC Initial Note (Signed)
Transition of Care [facility]) - Initial/Assessment Note    Patient Details  Name: Lisa Mcclure MRN: [id number] Date of Birth: 02/27/1933  [facility] CM/SW Contact:    Gelene Mink, [place] Phone Number: 09/16/2018, 4:17 PM  Clinical Narrative:                  CSW met with the patient at bedside. The patient was alert and oriented. CSW introduced herself and explained her role. CSW explained the SNF process. The patient is wanting to pursue home health services. CSW explained the home health process. She has used [facility] in the past. The patient stated that she has a daughter that lives in [place] and a son that lives in [place]. The patient stated that she has a friend along with her daughter that will help care for her. The patient stated that her son will also be able to come out and spend a week with her. The patient gave the CSW permission to contact her son and her daughter.   CSW called and left a message for the patients daughter. CSW called and spoke with the patient's son. The patient's son is agreeable with the plan. He stated that he will be working out the scheduling with his mom and sister. CSW stated that she will have another CSW or case management contact them.   The patient will need home health orders and DME.    Expected Discharge Plan: [place] Barriers to Discharge: Continued Medical Work up, Equipment Delay   Patient Goals and CMS Choice Patient states their goals for this hospitalization and ongoing recovery are:: Pt would like to go home and have home health. She is declining SNF at this time.  CMS Medicare.gov Compare Post Acute Care list provided to:: Patient Choice offered to / list presented to : Patient  Expected Discharge Plan and Services Expected Discharge Plan: [place] In-house Referral: Clinical Social Work   Post Acute Care Choice: [place] arrangements for the past 2  months: [place]: [place] ([facility])        Prior Living Arrangements/Services Living arrangements for the past 2 months: Apartment Lives with:: Self Patient language and need for interpreter reviewed:: No Do you feel safe going back to the place where you live?: Yes      Need for Family Participation in Patient Care: No (Comment) Care giver support system in place?: Yes (comment)   Criminal Activity/Legal Involvement Pertinent to Current Situation/Hospitalization: No - Comment as needed  Activities of Daily Living Home Assistive Devices/Equipment: Cane (specify quad or straight), Walker (specify type) ADL Screening (condition at time of admission) Patient's cognitive ability adequate to safely complete daily activities?: Yes Is the patient deaf or have difficulty hearing?: No Does the patient have difficulty seeing, even when wearing glasses/contacts?: No Does the patient have difficulty concentrating, remembering, or making decisions?: No Patient able to express need for assistance with ADLs?: Yes Does the patient have difficulty dressing or bathing?: No Independently performs ADLs?: Yes (appropriate for developmental age) Does the patient have difficulty walking or climbing stairs?: No Weakness of Legs: Both Weakness of Arms/Hands: None  Permission Sought/Granted Permission sought to share information with : Case Manager Permission granted to share information with : Yes, Verbal  Permission Granted  Share Information with NAME: Lujean Rave   Permission granted to share info w AGENCY: [place] granted to share info w Relationship: Daughter  Permission granted to share info w Contact Information: [phone]  Emotional Assessment Appearance:: Appears stated age Attitude/Demeanor/Rapport: Charismatic Affect (typically observed): Pleasant Orientation: : Oriented to  Time, Oriented to Self, Oriented  to Place, Oriented to Situation Alcohol / Substance Use: Not Applicable Psych Involvement: No (comment)  Admission diagnosis:  Low Sodium Patient Active Problem List   Diagnosis Date Noted  . Dehydration with hyponatremia 09/13/2018  . Bradycardia 09/13/2018  . Glaucoma 09/13/2018  . OA (osteoarthritis) of shoulder 05/16/2018  . Aortic stenosis, mild 01/15/2016  . Bilateral carotid bruits 05/11/2015  . Hyponatremia 04/19/2015  . Chronic anemia 04/19/2015  . Panic attack 04/19/2015  . Constipation 04/19/2015  . Hyperkalemia 04/17/2015  . Influenza A 04/16/2015  . Proximal humerus fracture   . Left patella fracture   . Benign essential HTN   . Esophageal reflux   . Chronic diastolic heart failure ([place])   . Hypothyroidism   . Lower GI bleed 04/08/2015  . Hematochezia   . Rectal bleeding 04/03/2015  . Patellar fracture 03/06/2015  . Left humeral fracture 11/04/2013  . Rib fracture 11/04/2013  . Hypothyroid 11/04/2013  . Hypertension 11/04/2013  . Right shoulder pain 11/04/2013  . OSA (obstructive sleep apnea) 11/13/2008  . BOOP (bronchiolitis obliterans with organizing pneumonia) ([place]) 08/25/2008   PCP:  [name], MD Pharmacy:   [id number] [id number]: [phone]: [phone]    Social Determinants of Health (SDOH) Interventions    Readmission Risk Interventions Readmission Risk Prevention Plan 09/16/2018  Transportation Screening Complete  PCP or Specialist Appt within 5-7 Days Complete  Home Care Screening Complete  Medication Review (RN CM) Complete  Some recent data might be hidden

## 2018-09-16 NOTE — Progress Notes (Signed)
PROGRESS NOTE    Lisa Mcclure  XBD:[id number] DOB: [birth year] DOA: 09/13/2018 PCP: [name], MD   Brief Narrative:  85 year old with history of diastolic CHF, restless leg, GERD, hypothyroidism, rheumatoid arthritis, essential hypertension came to the ER with complaints of generalized weakness.  Admitted with poor p.o. intake.  On admission she was found to be hyponatremic with sodium of 111.  She had clear signs of dehydration at the time of admission.  09/16/2018: Patient seen.  Sodium done today is 133.  Patient wants to be reassessed by physical therapy to determine if she can be discharged back home with home health.  We will reconsult physical therapy team.  Continue to hold IV fluids (patient has 1+ pitting leg edema).  Likely discharge tomorrow.  Assessment & Plan:   Principal Problem:   Dehydration with hyponatremia Active Problems:   Hypothyroidism   Benign essential HTN   Esophageal reflux   Chronic diastolic heart failure (HCC)   Bradycardia   Glaucoma  Severe hyponatremia, very slowly improving Moderate to severe dehydration - Admitted to the hospital with hydration, the following morning her sodium improved from 111 to 115. -At this time continue to hold any diuretics, gentle hydration.  BMP every 6 hours.  Monitor urine output.  Check urine electrolytes. - Avoid overcorrection of sodium. 09/15/2018: Repeat urine sodium, urine osmolality and plasma osmolality.  Check cortisol level. 09/16/2018: Sodium today is 133.  Low back pain, acute on chronic Will get Xr pelvis and lumbar spine, pain meds plus aggressive bowel regimen.  PT/OT. 09/15/2018: Adequately controlled with analgesics.  Low threshold to proceed with MRI if needed.  History of hypothyroidism -Continue Synthroid, check TSH levels.  Essential hypertension -Well-controlled.  Continue home regimen.  Chronic diastolic congestive heart failure, class I -Currently appears to be dehydrated.  Holding  Lasix and getting gentle hydration.  Restless leg syndrome -Resume home meds  DVT prophylaxis: On heparin Code Status: Full code Family Communication:  Spoke with her daughter Lujean Rave. Disposition Plan: Maintain hosp stay until Na is better.   Consultants:   None  Procedures:   None  Antimicrobials:   None   Subjective: Back pain is better controlled. No new complaints..  Objective: Vitals:   09/15/18 2121 09/15/18 2158 09/16/18 0515 09/16/18 0937  BP: (!) 170/56  (!) 133/45 (!) 151/59  Pulse: 60 68 (!) 52 (!) 57  Resp: (!) 21  19 18   Temp: 97.9 F (36.6 C)  (!) 97.4 F (36.3 C) (!) 97.5 F (36.4 C)  TempSrc: Oral  Oral Oral  SpO2: (!) 88% 98% 98% 99%  Weight: 94.3 kg     Height:        Intake/Output Summary (Last 24 hours) at 09/16/2018 1348 Last data filed at 09/16/2018 1024 Gross per 24 hour  Intake 1167.56 ml  Output 1000 ml  Net 167.56 ml   Filed Weights   09/13/18 1515 09/14/18 2107 09/15/18 2121  Weight: 93 kg 92.7 kg 94.3 kg    Examination:  General exam: Appears calm and comfortable, dry mouth Respiratory system: Clear to auscultation. Respiratory effort normal. Cardiovascular system: S1 & S2  Gastrointestinal system: Abdomen is obese, soft and nontender.  Central nervous system: Alert and oriented. No focal neurological deficits. Extremities: 1+ leg edema.  Data Reviewed:   CBC: Recent Labs  Lab 09/13/18 1521 09/14/18 0149  WBC 7.3 8.3  HGB 11.7* 12.2  HCT 32.7* 34.4*  MCV 85.4 84.3  PLT 272 834   Basic Metabolic Panel:  Recent Labs  Lab 09/14/18 1929 09/15/18 0114 09/15/18 0722 09/15/18 1344 09/16/18 0444  NA 124* 130* 132* 129* 133*  K 4.1 4.8 4.7 4.6 4.4  CL 91* 96* 99 98 102  CO2 28 22 24  20* 24  GLUCOSE 121* 97 98 131* 88  BUN 13 12 11 14 14   CREATININE 0.86 0.90 0.86 0.81 0.78  CALCIUM 9.1 8.8* 8.8* 8.6* 8.8*  MG  --  1.8  --   --  1.7  PHOS  --   --   --   --  3.9   GFR: Estimated Creatinine Clearance: 58.8  mL/min (by C-G formula based on SCr of 0.78 mg/dL). Liver Function Tests: Recent Labs  Lab 09/14/18 0149 09/16/18 0444  AST 18  --   ALT 11  --   ALKPHOS 63  --   BILITOT 1.1  --   PROT 7.1  --   ALBUMIN 4.1 3.1*   No results for input(s): LIPASE, AMYLASE in the last 168 hours. No results for input(s): AMMONIA in the last 168 hours. Coagulation Profile: No results for input(s): INR, PROTIME in the last 168 hours. Cardiac Enzymes: No results for input(s): CKTOTAL, CKMB, CKMBINDEX, TROPONINI in the last 168 hours. BNP (last 3 results) No results for input(s): PROBNP in the last 8760 hours. HbA1C: No results for input(s): HGBA1C in the last 72 hours. CBG: Recent Labs  Lab 09/13/18 1759  GLUCAP 100*   Lipid Profile: No results for input(s): CHOL, HDL, LDLCALC, TRIG, CHOLHDL, LDLDIRECT in the last 72 hours. Thyroid Function Tests: No results for input(s): TSH, T4TOTAL, FREET4, T3FREE, THYROIDAB in the last 72 hours. Anemia Panel: No results for input(s): VITAMINB12, FOLATE, FERRITIN, TIBC, IRON, RETICCTPCT in the last 72 hours. Sepsis Labs: No results for input(s): PROCALCITON, LATICACIDVEN in the last 168 hours.  Recent Results (from the past 240 hour(s))  SARS Coronavirus 2 (CEPHEID - Performed in [place] [place] hospital lab), Hosp Order     Status: None   Collection Time: 09/13/18  4:52 PM  Result Value Ref Range Status   SARS Coronavirus 2 NEGATIVE NEGATIVE Final    Comment: (NOTE) If result is NEGATIVE SARS-CoV-2 target nucleic acids are NOT DETECTED. The SARS-CoV-2 RNA is generally detectable in upper and lower  respiratory specimens during the acute phase of infection. The lowest  concentration of SARS-CoV-2 viral copies this assay can detect is 250  copies / mL. A negative result does not preclude SARS-CoV-2 infection  and should not be used as the sole basis for treatment or other  patient management decisions.  A negative result may occur with  improper specimen  collection / handling, submission of specimen other  than nasopharyngeal swab, presence of viral mutation(s) within the  areas targeted by this assay, and inadequate number of viral copies  (<250 copies / mL). A negative result must be combined with clinical  observations, patient history, and epidemiological information. If result is POSITIVE SARS-CoV-2 target nucleic acids are DETECTED. The SARS-CoV-2 RNA is generally detectable in upper and lower  respiratory specimens dur ing the acute phase of infection.  Positive  results are indicative of active infection with SARS-CoV-2.  Clinical  correlation with patient history and other diagnostic information is  necessary to determine patient infection status.  Positive results do  not rule out bacterial infection or co-infection with other viruses. If result is PRESUMPTIVE POSTIVE SARS-CoV-2 nucleic acids MAY BE PRESENT.   A presumptive positive result was obtained on the submitted specimen  and confirmed on repeat testing.  While 2019 novel coronavirus  (SARS-CoV-2) nucleic acids may be present in the submitted sample  additional confirmatory testing may be necessary for epidemiological  and / or clinical management purposes  to differentiate between  SARS-CoV-2 and other Sarbecovirus currently known to infect humans.  If clinically indicated additional testing with an alternate test  methodology [phone]) is advised. The SARS-CoV-2 RNA is generally  detectable in upper and lower respiratory sp ecimens during the acute  phase of infection. The expected result is Negative. Fact Sheet for Patients:  StrictlyIdeas.no Fact Sheet for Healthcare Providers: BankingDealers.co.za This test is not yet approved or cleared by the [place] FDA and has been authorized for detection and/or diagnosis of SARS-CoV-2 by FDA under an Emergency Use Authorization (EUA).  This EUA will remain in effect  (meaning this test can be used) for the duration of the COVID-19 declaration under Section 564(b)(1) of the Act, 21 U.S.C. section 360bbb-3(b)(1), unless the authorization is terminated or revoked sooner. Performed at [facility], [place] [address] [place], [place] [address]          Radiology Studies: Dg Lumbar Spine Complete  Result Date: 09/14/2018 CLINICAL DATA:  Back pain EXAM: LUMBAR SPINE - COMPLETE 4+ VIEW COMPARISON:  Chest x-ray dated 03/05/2017. FINDINGS: There is an age-indeterminate compression fracture of the T12 and T11 vertebral bodies. There is some mild height loss of L2 vertebral body. Degenerative changes are noted throughout the visualized lumbar spine. These are greatest at the lower lumbar segments were there is multilevel facet arthrosis and significant multilevel disc height loss. There appears to be a grade 1-2 anterolisthesis of L5 on S1. Diffuse osteopenia is noted. IMPRESSION: 1. Age-indeterminate height loss of the T11, T12, and L2 vertebral bodies. Correlation with point tenderness is recommended. If there is clinical concern for an acute fracture this can be further evaluated with cross-sectional imaging. 2. Multilevel degenerative changes throughout the lumbar spine. 3. Grade 1-2 anterolisthesis of L5 on S1. 4. Osteopenia. Electronically Signed   By: Constance Holster M.D.   On: 09/14/2018 19:25   Dg Pelvis 1-2 Views  Result Date: 09/14/2018 CLINICAL DATA:  Low back pain with osteoarthritis. EXAM: PELVIS - 1-2 VIEW COMPARISON:  None. FINDINGS: There is no acute displaced fracture or dislocation. There is advanced osteoarthritis of the right hip. There is mild-to-moderate osteoarthritis of the left hip. There are degenerative changes of the pubic symphysis. There is osteopenia. IMPRESSION: 1. No acute displaced fracture or dislocation. 2. Advanced osteoarthritis of the right hip. 3. Mild-to-moderate osteoarthritis of the left hip. 4. Osteopenia which limits  detection of nondisplaced fractures. Electronically Signed   By: Constance Holster M.D.   On: 09/14/2018 19:20        Scheduled Meds: . aspirin EC  81 mg Oral Daily  . carvedilol  25 mg Oral BID  . cholecalciferol  5,000 Units Oral Q breakfast  . enoxaparin (LOVENOX) injection  40 mg Subcutaneous Q24H  . hydrALAZINE  25 mg Oral BID  . latanoprost  1 drop Both Eyes QHS  . levothyroxine  100 mcg Oral QAC breakfast  . losartan  100 mg Oral BID  . multivitamin  1 tablet Oral BID  . pantoprazole  40 mg Oral Daily  . rOPINIRole  1 mg Oral Daily  . sodium chloride flush  3 mL Intravenous Once   Continuous Infusions:    LOS: 3 days   Time spent= 25 Mins   Yehuda Savannah I  Marthenia Rolling, MD [facility]  If 7PM-7AM, please contact night-coverage [email or web address] 09/16/2018, 1:48 PM

## 2018-09-17 LAB — RENAL FUNCTION PANEL
Albumin: 3.1 g/dL — ABNORMAL LOW (ref 3.5–5.0)
Anion gap: 9 (ref 5–15)
BUN: 12 mg/dL (ref 8–23)
CO2: 25 mmol/L (ref 22–32)
Calcium: 9 mg/dL (ref 8.9–10.3)
Chloride: 101 mmol/L (ref 98–111)
Creatinine, Ser: 1.01 mg/dL — ABNORMAL HIGH (ref 0.44–1.00)
GFR calc Af Amer: 60 mL/min — ABNORMAL LOW (ref >60)
GFR calc non Af Amer: 51 mL/min — ABNORMAL LOW (ref >60)
Glucose, Bld: 84 mg/dL (ref 70–99)
Phosphorus: 4.4 mg/dL (ref 2.5–4.6)
Potassium: 4.4 mmol/L (ref 3.5–5.1)
Sodium: 135 mmol/L (ref 135–145)

## 2018-09-17 LAB — MAGNESIUM: Magnesium: 1.7 mg/dL (ref 1.7–2.4)

## 2018-09-17 MED ORDER — SENNOSIDES-DOCUSATE SODIUM 8.6-50 MG PO TABS: 2.0000 | ORAL_TABLET | Freq: Every evening | ORAL | 0 refills | Status: DC | PRN

## 2018-09-17 MED ORDER — LORATADINE 10 MG PO TABS: 10.0000 mg | ORAL_TABLET | Freq: Every day | ORAL | 0 refills | Status: DC | PRN

## 2018-09-17 MED ORDER — BUSPIRONE HCL 7.5 MG PO TABS: 7.5000 mg | ORAL_TABLET | Freq: Every day | ORAL | 0 refills | Status: DC | PRN

## 2018-09-17 MED ORDER — HYDROCORTISONE (PERIANAL) 2.5 % EX CREA: 1.0000 "application " | TOPICAL_CREAM | Freq: Four times a day (QID) | CUTANEOUS | 0 refills | Status: DC | PRN

## 2018-09-17 NOTE — Consult Note (Signed)
   [facility] Inpatient Consult   09/17/2018  Lisa Mcclure 1933-01-30 [id number]   Reviewed your referral request for [place] Management services with [place].  Patient in the Medicare [facility].  Chart review reveals patient and family are planning for home as patient is being recommended for 24 hour supervision. Plan: Will follow up with inpatient [facility] team for referral needs.  Will assign as appropriate.  Natividad Brood, RN BSN [facility] Liaison  [phone] business mobile phone Toll free office [phone]  Fax number: [phone] [email or web address]

## 2018-09-17 NOTE — Care Management Important Message (Signed)
Important Message  Patient Details  Name: Lisa Mcclure MRN: [id number] Date of Birth: 1933/02/14   Medicare Important Message Given:  Yes    Memory Argue 09/17/2018, 4:23 PM

## 2018-09-17 NOTE — TOC Transition Note (Signed)
Transition of Care [facility]) - CM/SW Discharge Note   Patient Details  Name: Lisa Mcclure MRN: [id number] Date of Birth: December 14, 1932  Transition [facility] CM/SW Contact:  Bartholomew Crews, RN Phone Number: [phone] 09/17/2018, 11:31 AM   Clinical Narrative:    Spoke with patient at bedside this morning to discuss transition home instead of to rehab facility. Discussed Home First program through [place] which offers increased intensity of services for 30 days for patients with straight Medicare/THN banner who want to return home from hospital. Patient agreed to referral - spoke with Tommi Rumps at [facility] - pending acceptance. Spoke with MD who stated that patient is medically stable to be discharged today if coordination of services in place. Spoke with daughter Lujean Rave [phone]. Lujean Rave is available to assist with 24/7 coverage in patient's home. Lujean Rave stated that her home is too big. Patient had stated that her townhouse is handicap equipped with raised toilet seats and wide doorways. Patient reports having walker, bedside commode, and 2 wheelchairs. CM following for transition of care needs. Anticipate transition home today.    Final next level of care: [place] Barriers to Discharge: No Barriers Identified   Patient Goals and CMS Choice Patient states their goals for this hospitalization and ongoing recovery are:: Patient wants to return home CMS Medicare.gov Compare Post Acute Care list provided to:: Patient Choice offered to / list presented to : Patient  Discharge Placement                  Name of family member notified: Ezequiel Essex [phone]    Discharge Plan and Services In-house Referral: Clinical Social Work   Post Acute Care Choice: Home Health          DME Arranged: N/A DME Agency: NA       HH Arranged: RN, OT, PT [place] Agency: [place] Date [place]: 09/17/18 Time [place]: 1128 Representative  spoke with at [place]: [place] ([place]) Interventions     Readmission Risk Interventions Readmission Risk Prevention Plan 09/16/2018  Transportation Screening Complete  PCP or Specialist Appt within 5-7 Days Complete  Home Care Screening Complete  Medication Review (RN CM) Complete  Some recent data might be hidden

## 2018-09-17 NOTE — Progress Notes (Signed)
Physical Therapy Treatment Patient Details Name: Lisa Mcclure MRN: [id number] DOB: April 25, 1932 Today's Date: 09/17/2018    History of Present Illness Pt is an 85 y/o female admitted secondary to dehydration and hyponatremia. PMH includes HTN, dCHF, glaucoma, and RA.     PT Comments    Continuing work on functional mobility and activity tolerance;  Notable improvements in transfers and gait; managing household distances; I agree to updating DC recommendations to home with [facility] services; Recommend [facility] for a more rehabilitation-focused Home Health course  Follow Up Recommendations  Home health PT;Supervision/Assistance - 24 hour;Other (comment)([facility])     Equipment Recommendations  None recommended by PT    Recommendations for Other Services       Precautions / Restrictions Precautions Precautions: Fall Precaution Comments: Make a bathroom stop before walkign Restrictions Weight Bearing Restrictions: No    Mobility  Bed Mobility               General bed mobility comments: sitting in recliner  Transfers Overall transfer level: Needs assistance Equipment used: Rolling walker (2 wheeled) Transfers: Sit to/from Stand Sit to Stand: Min guard         General transfer comment: Minguard for safety  Ambulation/Gait Ambulation/Gait assistance: Min guard Gait Distance (Feet): 100 Feet Assistive device: Rolling walker (2 wheeled) Gait Pattern/deviations: Step-through pattern;Decreased stride length     General Gait Details: Stopped by bathroom first; the very nice walk in the hallway; cues to self-monitor for activity tolerance   Stairs             Wheelchair Mobility    Modified Rankin (Stroke Patients Only)       Balance     Sitting balance-Leahy Scale: Good       Standing balance-Leahy Scale: Fair Standing balance comment: stood at sink for hand hygeine                             Cognition Arousal/Alertness: Awake/alert Behavior During Therapy: WFL for tasks assessed/performed Overall Cognitive Status: Within Functional Limits for tasks assessed(for simple mobility tasks)                                        Exercises      General Comments General comments (skin integrity, edema, etc.): Lengthy discussion re: possible help in the home      Pertinent Vitals/Pain Pain Assessment: No/denies pain    Home Living                      Prior Function            PT Goals (current goals can now be found in the care plan section) Acute Rehab PT Goals Patient Stated Goal: Hopes to be able to get home PT Goal Formulation: With patient Time For Goal Achievement: 09/28/18 Potential to Achieve Goals: Good Progress towards PT goals: Progressing toward goals    Frequency    Min 2X/week      PT Plan Discharge plan needs to be updated    Co-evaluation              AM-PAC PT "6 Clicks" Mobility   Outcome Measure  Help needed turning from your back to your side while in a flat bed without using bedrails?: A Little Help needed  moving from lying on your back to sitting on the side of a flat bed without using bedrails?: A Little Help needed moving to and from a bed to a chair (including a wheelchair)?: A Little Help needed standing up from a chair using your arms (e.g., wheelchair or bedside chair)?: A Little Help needed to walk in hospital room?: A Little Help needed climbing 3-5 steps with a railing? : A Lot 6 Click Score: 17    End of Session Equipment Utilized During Treatment: Gait belt Activity Tolerance: Patient tolerated treatment well Patient left: in chair;with call bell/phone within reach Nurse Communication: Mobility status PT Visit Diagnosis: Unsteadiness on feet (R26.81);Muscle weakness (generalized) (M62.81)     Time: 6468-0321 PT Time Calculation (min) (ACUTE ONLY): 37 min  Charges:  $Gait Training:  8-22 mins $Therapeutic Activity: 8-22 mins                     Roney Marion, PT  Acute Rehabilitation Services Pager [phone] Office [place] 09/17/2018, 10:43 AM

## 2018-09-17 NOTE — Discharge Summary (Signed)
Physician Discharge Summary  Patient ID: LUA FENG MRN: [id number] DOB/AGE: [birth year] 85 y.o.  Admit date: 09/13/2018 Discharge date: 09/17/2018  Admission Diagnoses:  Discharge Diagnoses:  Principal Problem:   Dehydration with hyponatremia Active Problems:   Hypothyroidism   Benign essential HTN   Esophageal reflux   Chronic diastolic heart failure (HCC)   Bradycardia   Glaucoma   Discharged Condition: stable  Hospital Course: 85 year old with history of diastolic CHF, restless leg, GERD, hypothyroidism, rheumatoid arthritis, essential hypertension came to the ER with complaints of generalized weakness.  Admitted with poor p.o. intake.  On admission she was found to be hyponatremic with sodium of 111.  She had clear signs of dehydration at the time of admission.  Severe hyponatremia, very slowly improving Moderate to severe dehydration - Admitted to the hospital with hydration, the following morning her sodium improved from 111 to 115. -At this time continue to hold any diuretics, gentle hydration.  BMP every 6 hours.  Monitor urine output.  Check urine electrolytes. - Avoid overcorrection of sodium. 09/15/2018: Repeat urine sodium, urine osmolality and plasma osmolality.  Check cortisol level. 09/16/2018: Sodium today is 133.  Low back pain, acute on chronic Will get Xr pelvis and lumbar spine, pain meds plus aggressive bowel regimen.  PT/OT. 09/15/2018: Adequately controlled with analgesics.  Low threshold to proceed with MRI if needed.  History of hypothyroidism -Continue Synthroid, check TSH levels.  Essential hypertension -Well-controlled.  Continue home regimen.  Chronic diastolic congestive heart failure, class I -Currently appears to be dehydrated.  Holding Lasix and getting gentle hydration.  Restless leg syndrome -Resumed home meds  Consults: None  Significant Diagnostic Studies:   Treatments:   Discharge Exam: Blood pressure 136/61, pulse 65,  temperature 98.1 F (36.7 C), temperature source Oral, resp. rate 18, height 5' 3.5" (1.613 m), weight 93.9 kg, SpO2 99 %.   Disposition: Discharge disposition: 06-Home-Health Care Svc   Discharge Instructions    Diet - low sodium heart healthy   Complete by:  As directed    Increase activity slowly   Complete by:  As directed      Allergies as of 09/17/2018      Reactions   Statins Other (See Comments)   Muscle aches and INTERNAL BLEEDING   Gabapentin Other (See Comments)   "Made me loopy"   Methocarbamol Other (See Comments)   "Made me loopy"      Medication List    STOP taking these medications   desmopressin 0.2 MG tablet Commonly known as:  DDAVP   fluticasone furoate-vilanterol 100-25 MCG/INH Aepb Commonly known as:  Breo Ellipta   ICaps Areds 2 Caps   ondansetron 4 MG tablet Commonly known as:  ZOFRAN   spironolactone 25 MG tablet Commonly known as:  ALDACTONE     TAKE these medications   aspirin EC 81 MG tablet Take 81 mg by mouth daily.   busPIRone 7.5 MG tablet Commonly known as:  BUSPAR Take 1 tablet (7.5 mg total) by mouth daily as needed (for anxiety).   carvedilol 25 MG tablet Commonly known as:  COREG Take 25 mg by mouth 2 (two) times daily.   fluticasone 50 MCG/ACT nasal spray Commonly known as:  FLONASE Place 2 sprays into both nostrils daily as needed for allergies or rhinitis.   furosemide 20 MG tablet Commonly known as:  Lasix Take 1 tablet (20 mg total) by mouth daily. What changed:    when to take this  reasons to take this  hydrALAZINE 25 MG tablet Commonly known as:  APRESOLINE TAKE 1 TABLET 3 TIMES A DAY MAKE APPT WITH DR Meda Coffee What changed:  See the new instructions.   HYDROcodone-acetaminophen 7.5-325 MG tablet Commonly known as:  NORCO Take 1 tablet by mouth every 6 (six) hours as needed for moderate pain.   hydrocortisone 2.5 % rectal cream Commonly known as:  ANUSOL-HC Apply 1 application topically 4 (four)  times daily as needed for hemorrhoids.   latanoprost 0.005 % ophthalmic solution Commonly known as:  XALATAN Place 1 drop into both eyes at bedtime.   levothyroxine 100 MCG tablet Commonly known as:  SYNTHROID Take 100 mcg by mouth daily before breakfast.   loratadine 10 MG tablet Commonly known as:  CLARITIN Take 1 tablet (10 mg total) by mouth daily as needed for allergies.   losartan 100 MG tablet Commonly known as:  COZAAR Take 100 mg by mouth 2 (two) times daily.   MULTI-DAY VITAMINS PO Take 1 tablet by mouth daily.   omeprazole 20 MG capsule Commonly known as:  PRILOSEC Take 20 mg by mouth 2 (two) times daily before a meal.   rOPINIRole 1 MG tablet Commonly known as:  REQUIP Take 1 mg by mouth See admin instructions. Take 1 mg by mouth once a day between 3 PM-4 PM and an additional 1 mg in the evening, if no relief   senna-docusate 8.6-50 MG tablet Commonly known as:  Senokot-S Take 2 tablets by mouth at bedtime as needed for mild constipation or moderate constipation. What changed:    how much to take  when to take this  reasons to take this   Vitamin D3 125 MCG (5000 UT) Caps Take 5,000 Units by mouth daily with breakfast.      Follow-up Information    Care, [facility] Follow up.   Specialty:  [place] Why:  you will be participating in [place] program - an RN will call to schedule a visit/assessment Contact information: [place] [address] [phone]           Signed: Bonnell Public 09/17/2018, 1:47 PM

## 2018-09-17 NOTE — Progress Notes (Signed)
Lisa Mcclure to be discharged home per MD order. Discussed prescriptions and follow up appointments with the patient. Prescriptions given to patient; medication list explained in detail. Patient verbalized understanding. [place] was taken out.   Skin clean, dry and intact without evidence of skin break down, no evidence of skin tears noted. IV catheter discontinued intact. Site without signs and symptoms of complications. Dressing and pressure applied. Pt denies pain at the site currently. No complaints noted.  Patient free of lines, drains, and wounds.   An After Visit Summary (AVS) was printed and given to the patient. Patient escorted via wheelchair, and discharged home via private auto.  Baldo Ash, RN

## 2018-09-18 DIAGNOSIS — I110 Hypertensive heart disease with heart failure: Secondary | ICD-10-CM | POA: Diagnosis not present

## 2018-09-18 DIAGNOSIS — M25511 Pain in right shoulder: Secondary | ICD-10-CM | POA: Diagnosis not present

## 2018-09-18 DIAGNOSIS — M069 Rheumatoid arthritis, unspecified: Secondary | ICD-10-CM | POA: Diagnosis not present

## 2018-09-18 DIAGNOSIS — K5790 Diverticulosis of intestine, part unspecified, without perforation or abscess without bleeding: Secondary | ICD-10-CM | POA: Diagnosis not present

## 2018-09-18 DIAGNOSIS — E669 Obesity, unspecified: Secondary | ICD-10-CM | POA: Diagnosis not present

## 2018-09-18 DIAGNOSIS — G4733 Obstructive sleep apnea (adult) (pediatric): Secondary | ICD-10-CM | POA: Diagnosis not present

## 2018-09-18 DIAGNOSIS — M25512 Pain in left shoulder: Secondary | ICD-10-CM | POA: Diagnosis not present

## 2018-09-18 DIAGNOSIS — K219 Gastro-esophageal reflux disease without esophagitis: Secondary | ICD-10-CM | POA: Diagnosis not present

## 2018-09-18 DIAGNOSIS — H409 Unspecified glaucoma: Secondary | ICD-10-CM | POA: Diagnosis not present

## 2018-09-18 DIAGNOSIS — R001 Bradycardia, unspecified: Secondary | ICD-10-CM | POA: Diagnosis not present

## 2018-09-18 DIAGNOSIS — E871 Hypo-osmolality and hyponatremia: Secondary | ICD-10-CM | POA: Diagnosis not present

## 2018-09-18 DIAGNOSIS — G4700 Insomnia, unspecified: Secondary | ICD-10-CM | POA: Diagnosis not present

## 2018-09-18 DIAGNOSIS — M545 Low back pain: Secondary | ICD-10-CM | POA: Diagnosis not present

## 2018-09-18 DIAGNOSIS — D649 Anemia, unspecified: Secondary | ICD-10-CM | POA: Diagnosis not present

## 2018-09-18 DIAGNOSIS — Z87891 Personal history of nicotine dependence: Secondary | ICD-10-CM | POA: Diagnosis not present

## 2018-09-18 DIAGNOSIS — F329 Major depressive disorder, single episode, unspecified: Secondary | ICD-10-CM | POA: Diagnosis not present

## 2018-09-18 DIAGNOSIS — N3281 Overactive bladder: Secondary | ICD-10-CM | POA: Diagnosis not present

## 2018-09-18 DIAGNOSIS — Z9181 History of falling: Secondary | ICD-10-CM | POA: Diagnosis not present

## 2018-09-18 DIAGNOSIS — Z7982 Long term (current) use of aspirin: Secondary | ICD-10-CM | POA: Diagnosis not present

## 2018-09-18 DIAGNOSIS — G2581 Restless legs syndrome: Secondary | ICD-10-CM | POA: Diagnosis not present

## 2018-09-18 DIAGNOSIS — E039 Hypothyroidism, unspecified: Secondary | ICD-10-CM | POA: Diagnosis not present

## 2018-09-18 DIAGNOSIS — I5032 Chronic diastolic (congestive) heart failure: Secondary | ICD-10-CM | POA: Diagnosis not present

## 2018-09-18 DIAGNOSIS — M1990 Unspecified osteoarthritis, unspecified site: Secondary | ICD-10-CM | POA: Diagnosis not present

## 2018-09-18 DIAGNOSIS — I872 Venous insufficiency (chronic) (peripheral): Secondary | ICD-10-CM | POA: Diagnosis not present

## 2018-09-18 DIAGNOSIS — Z9981 Dependence on supplemental oxygen: Secondary | ICD-10-CM | POA: Diagnosis not present

## 2018-09-19 DIAGNOSIS — I5032 Chronic diastolic (congestive) heart failure: Secondary | ICD-10-CM | POA: Diagnosis not present

## 2018-09-19 DIAGNOSIS — M069 Rheumatoid arthritis, unspecified: Secondary | ICD-10-CM | POA: Diagnosis not present

## 2018-09-19 DIAGNOSIS — I110 Hypertensive heart disease with heart failure: Secondary | ICD-10-CM | POA: Diagnosis not present

## 2018-09-19 DIAGNOSIS — I872 Venous insufficiency (chronic) (peripheral): Secondary | ICD-10-CM | POA: Diagnosis not present

## 2018-09-19 DIAGNOSIS — M545 Low back pain: Secondary | ICD-10-CM | POA: Diagnosis not present

## 2018-09-19 DIAGNOSIS — H353132 Nonexudative age-related macular degeneration, bilateral, intermediate dry stage: Secondary | ICD-10-CM | POA: Diagnosis not present

## 2018-09-19 DIAGNOSIS — E871 Hypo-osmolality and hyponatremia: Secondary | ICD-10-CM | POA: Diagnosis not present

## 2018-09-20 DIAGNOSIS — R269 Unspecified abnormalities of gait and mobility: Secondary | ICD-10-CM | POA: Diagnosis not present

## 2018-09-20 DIAGNOSIS — M5489 Other dorsalgia: Secondary | ICD-10-CM | POA: Diagnosis not present

## 2018-09-20 DIAGNOSIS — I5032 Chronic diastolic (congestive) heart failure: Secondary | ICD-10-CM | POA: Diagnosis not present

## 2018-09-20 DIAGNOSIS — K219 Gastro-esophageal reflux disease without esophagitis: Secondary | ICD-10-CM | POA: Diagnosis not present

## 2018-09-20 DIAGNOSIS — E871 Hypo-osmolality and hyponatremia: Secondary | ICD-10-CM | POA: Diagnosis not present

## 2018-09-20 DIAGNOSIS — I110 Hypertensive heart disease with heart failure: Secondary | ICD-10-CM | POA: Diagnosis not present

## 2018-09-21 DIAGNOSIS — I110 Hypertensive heart disease with heart failure: Secondary | ICD-10-CM | POA: Diagnosis not present

## 2018-09-21 DIAGNOSIS — E871 Hypo-osmolality and hyponatremia: Secondary | ICD-10-CM | POA: Diagnosis not present

## 2018-09-21 DIAGNOSIS — I5032 Chronic diastolic (congestive) heart failure: Secondary | ICD-10-CM | POA: Diagnosis not present

## 2018-09-21 DIAGNOSIS — M545 Low back pain: Secondary | ICD-10-CM | POA: Diagnosis not present

## 2018-09-21 DIAGNOSIS — I872 Venous insufficiency (chronic) (peripheral): Secondary | ICD-10-CM | POA: Diagnosis not present

## 2018-09-21 DIAGNOSIS — M069 Rheumatoid arthritis, unspecified: Secondary | ICD-10-CM | POA: Diagnosis not present

## 2018-09-24 DIAGNOSIS — M069 Rheumatoid arthritis, unspecified: Secondary | ICD-10-CM | POA: Diagnosis not present

## 2018-09-24 DIAGNOSIS — E871 Hypo-osmolality and hyponatremia: Secondary | ICD-10-CM | POA: Diagnosis not present

## 2018-09-24 DIAGNOSIS — I872 Venous insufficiency (chronic) (peripheral): Secondary | ICD-10-CM | POA: Diagnosis not present

## 2018-09-24 DIAGNOSIS — I110 Hypertensive heart disease with heart failure: Secondary | ICD-10-CM | POA: Diagnosis not present

## 2018-09-24 DIAGNOSIS — I5032 Chronic diastolic (congestive) heart failure: Secondary | ICD-10-CM | POA: Diagnosis not present

## 2018-09-24 DIAGNOSIS — M545 Low back pain: Secondary | ICD-10-CM | POA: Diagnosis not present

## 2018-09-24 DIAGNOSIS — I10 Essential (primary) hypertension: Secondary | ICD-10-CM | POA: Diagnosis not present

## 2018-09-25 DIAGNOSIS — I110 Hypertensive heart disease with heart failure: Secondary | ICD-10-CM | POA: Diagnosis not present

## 2018-09-25 DIAGNOSIS — I872 Venous insufficiency (chronic) (peripheral): Secondary | ICD-10-CM | POA: Diagnosis not present

## 2018-09-25 DIAGNOSIS — E871 Hypo-osmolality and hyponatremia: Secondary | ICD-10-CM | POA: Diagnosis not present

## 2018-09-25 DIAGNOSIS — I5032 Chronic diastolic (congestive) heart failure: Secondary | ICD-10-CM | POA: Diagnosis not present

## 2018-09-25 DIAGNOSIS — M545 Low back pain: Secondary | ICD-10-CM | POA: Diagnosis not present

## 2018-09-25 DIAGNOSIS — M069 Rheumatoid arthritis, unspecified: Secondary | ICD-10-CM | POA: Diagnosis not present

## 2018-09-26 DIAGNOSIS — E7849 Other hyperlipidemia: Secondary | ICD-10-CM | POA: Diagnosis not present

## 2018-09-27 DIAGNOSIS — I110 Hypertensive heart disease with heart failure: Secondary | ICD-10-CM | POA: Diagnosis not present

## 2018-09-27 DIAGNOSIS — E871 Hypo-osmolality and hyponatremia: Secondary | ICD-10-CM | POA: Diagnosis not present

## 2018-09-27 DIAGNOSIS — M545 Low back pain: Secondary | ICD-10-CM | POA: Diagnosis not present

## 2018-09-27 DIAGNOSIS — I5032 Chronic diastolic (congestive) heart failure: Secondary | ICD-10-CM | POA: Diagnosis not present

## 2018-09-27 DIAGNOSIS — M069 Rheumatoid arthritis, unspecified: Secondary | ICD-10-CM | POA: Diagnosis not present

## 2018-09-27 DIAGNOSIS — I872 Venous insufficiency (chronic) (peripheral): Secondary | ICD-10-CM | POA: Diagnosis not present

## 2018-10-01 DIAGNOSIS — I110 Hypertensive heart disease with heart failure: Secondary | ICD-10-CM | POA: Diagnosis not present

## 2018-10-01 DIAGNOSIS — I5032 Chronic diastolic (congestive) heart failure: Secondary | ICD-10-CM | POA: Diagnosis not present

## 2018-10-01 DIAGNOSIS — I872 Venous insufficiency (chronic) (peripheral): Secondary | ICD-10-CM | POA: Diagnosis not present

## 2018-10-01 DIAGNOSIS — E871 Hypo-osmolality and hyponatremia: Secondary | ICD-10-CM | POA: Diagnosis not present

## 2018-10-01 DIAGNOSIS — M545 Low back pain: Secondary | ICD-10-CM | POA: Diagnosis not present

## 2018-10-01 DIAGNOSIS — M069 Rheumatoid arthritis, unspecified: Secondary | ICD-10-CM | POA: Diagnosis not present

## 2018-10-02 DIAGNOSIS — I872 Venous insufficiency (chronic) (peripheral): Secondary | ICD-10-CM | POA: Diagnosis not present

## 2018-10-02 DIAGNOSIS — E871 Hypo-osmolality and hyponatremia: Secondary | ICD-10-CM | POA: Diagnosis not present

## 2018-10-02 DIAGNOSIS — I5032 Chronic diastolic (congestive) heart failure: Secondary | ICD-10-CM | POA: Diagnosis not present

## 2018-10-02 DIAGNOSIS — M069 Rheumatoid arthritis, unspecified: Secondary | ICD-10-CM | POA: Diagnosis not present

## 2018-10-02 DIAGNOSIS — M545 Low back pain: Secondary | ICD-10-CM | POA: Diagnosis not present

## 2018-10-02 DIAGNOSIS — I110 Hypertensive heart disease with heart failure: Secondary | ICD-10-CM | POA: Diagnosis not present

## 2018-10-03 DIAGNOSIS — I872 Venous insufficiency (chronic) (peripheral): Secondary | ICD-10-CM | POA: Diagnosis not present

## 2018-10-03 DIAGNOSIS — M545 Low back pain: Secondary | ICD-10-CM | POA: Diagnosis not present

## 2018-10-03 DIAGNOSIS — I110 Hypertensive heart disease with heart failure: Secondary | ICD-10-CM | POA: Diagnosis not present

## 2018-10-03 DIAGNOSIS — E871 Hypo-osmolality and hyponatremia: Secondary | ICD-10-CM | POA: Diagnosis not present

## 2018-10-03 DIAGNOSIS — M069 Rheumatoid arthritis, unspecified: Secondary | ICD-10-CM | POA: Diagnosis not present

## 2018-10-03 DIAGNOSIS — I5032 Chronic diastolic (congestive) heart failure: Secondary | ICD-10-CM | POA: Diagnosis not present

## 2018-10-05 DIAGNOSIS — I110 Hypertensive heart disease with heart failure: Secondary | ICD-10-CM | POA: Diagnosis not present

## 2018-10-05 DIAGNOSIS — I5032 Chronic diastolic (congestive) heart failure: Secondary | ICD-10-CM | POA: Diagnosis not present

## 2018-10-05 DIAGNOSIS — M069 Rheumatoid arthritis, unspecified: Secondary | ICD-10-CM | POA: Diagnosis not present

## 2018-10-05 DIAGNOSIS — E871 Hypo-osmolality and hyponatremia: Secondary | ICD-10-CM | POA: Diagnosis not present

## 2018-10-05 DIAGNOSIS — I872 Venous insufficiency (chronic) (peripheral): Secondary | ICD-10-CM | POA: Diagnosis not present

## 2018-10-05 DIAGNOSIS — M545 Low back pain: Secondary | ICD-10-CM | POA: Diagnosis not present

## 2018-10-08 DIAGNOSIS — I5032 Chronic diastolic (congestive) heart failure: Secondary | ICD-10-CM | POA: Diagnosis not present

## 2018-10-08 DIAGNOSIS — M069 Rheumatoid arthritis, unspecified: Secondary | ICD-10-CM | POA: Diagnosis not present

## 2018-10-08 DIAGNOSIS — I110 Hypertensive heart disease with heart failure: Secondary | ICD-10-CM | POA: Diagnosis not present

## 2018-10-08 DIAGNOSIS — I872 Venous insufficiency (chronic) (peripheral): Secondary | ICD-10-CM | POA: Diagnosis not present

## 2018-10-08 DIAGNOSIS — E871 Hypo-osmolality and hyponatremia: Secondary | ICD-10-CM | POA: Diagnosis not present

## 2018-10-08 DIAGNOSIS — M545 Low back pain: Secondary | ICD-10-CM | POA: Diagnosis not present

## 2018-10-10 DIAGNOSIS — I872 Venous insufficiency (chronic) (peripheral): Secondary | ICD-10-CM | POA: Diagnosis not present

## 2018-10-10 DIAGNOSIS — I110 Hypertensive heart disease with heart failure: Secondary | ICD-10-CM | POA: Diagnosis not present

## 2018-10-10 DIAGNOSIS — M069 Rheumatoid arthritis, unspecified: Secondary | ICD-10-CM | POA: Diagnosis not present

## 2018-10-10 DIAGNOSIS — I5032 Chronic diastolic (congestive) heart failure: Secondary | ICD-10-CM | POA: Diagnosis not present

## 2018-10-10 DIAGNOSIS — E871 Hypo-osmolality and hyponatremia: Secondary | ICD-10-CM | POA: Diagnosis not present

## 2018-10-10 DIAGNOSIS — M545 Low back pain: Secondary | ICD-10-CM | POA: Diagnosis not present

## 2018-10-15 DIAGNOSIS — I5032 Chronic diastolic (congestive) heart failure: Secondary | ICD-10-CM | POA: Diagnosis not present

## 2018-10-15 DIAGNOSIS — I110 Hypertensive heart disease with heart failure: Secondary | ICD-10-CM | POA: Diagnosis not present

## 2018-10-15 DIAGNOSIS — M545 Low back pain: Secondary | ICD-10-CM | POA: Diagnosis not present

## 2018-10-15 DIAGNOSIS — I872 Venous insufficiency (chronic) (peripheral): Secondary | ICD-10-CM | POA: Diagnosis not present

## 2018-10-15 DIAGNOSIS — E871 Hypo-osmolality and hyponatremia: Secondary | ICD-10-CM | POA: Diagnosis not present

## 2018-10-15 DIAGNOSIS — M069 Rheumatoid arthritis, unspecified: Secondary | ICD-10-CM | POA: Diagnosis not present

## 2018-10-18 DIAGNOSIS — Z87891 Personal history of nicotine dependence: Secondary | ICD-10-CM | POA: Diagnosis not present

## 2018-10-18 DIAGNOSIS — E871 Hypo-osmolality and hyponatremia: Secondary | ICD-10-CM | POA: Diagnosis not present

## 2018-10-18 DIAGNOSIS — Z9981 Dependence on supplemental oxygen: Secondary | ICD-10-CM | POA: Diagnosis not present

## 2018-10-18 DIAGNOSIS — M1990 Unspecified osteoarthritis, unspecified site: Secondary | ICD-10-CM | POA: Diagnosis not present

## 2018-10-18 DIAGNOSIS — M25511 Pain in right shoulder: Secondary | ICD-10-CM | POA: Diagnosis not present

## 2018-10-18 DIAGNOSIS — M545 Low back pain: Secondary | ICD-10-CM | POA: Diagnosis not present

## 2018-10-18 DIAGNOSIS — M069 Rheumatoid arthritis, unspecified: Secondary | ICD-10-CM | POA: Diagnosis not present

## 2018-10-18 DIAGNOSIS — Z9181 History of falling: Secondary | ICD-10-CM | POA: Diagnosis not present

## 2018-10-18 DIAGNOSIS — R001 Bradycardia, unspecified: Secondary | ICD-10-CM | POA: Diagnosis not present

## 2018-10-18 DIAGNOSIS — I110 Hypertensive heart disease with heart failure: Secondary | ICD-10-CM | POA: Diagnosis not present

## 2018-10-18 DIAGNOSIS — I872 Venous insufficiency (chronic) (peripheral): Secondary | ICD-10-CM | POA: Diagnosis not present

## 2018-10-18 DIAGNOSIS — M25512 Pain in left shoulder: Secondary | ICD-10-CM | POA: Diagnosis not present

## 2018-10-18 DIAGNOSIS — N3281 Overactive bladder: Secondary | ICD-10-CM | POA: Diagnosis not present

## 2018-10-18 DIAGNOSIS — H409 Unspecified glaucoma: Secondary | ICD-10-CM | POA: Diagnosis not present

## 2018-10-18 DIAGNOSIS — Z7982 Long term (current) use of aspirin: Secondary | ICD-10-CM | POA: Diagnosis not present

## 2018-10-18 DIAGNOSIS — G2581 Restless legs syndrome: Secondary | ICD-10-CM | POA: Diagnosis not present

## 2018-10-18 DIAGNOSIS — K219 Gastro-esophageal reflux disease without esophagitis: Secondary | ICD-10-CM | POA: Diagnosis not present

## 2018-10-18 DIAGNOSIS — G4700 Insomnia, unspecified: Secondary | ICD-10-CM | POA: Diagnosis not present

## 2018-10-18 DIAGNOSIS — I5032 Chronic diastolic (congestive) heart failure: Secondary | ICD-10-CM | POA: Diagnosis not present

## 2018-10-18 DIAGNOSIS — D649 Anemia, unspecified: Secondary | ICD-10-CM | POA: Diagnosis not present

## 2018-10-18 DIAGNOSIS — E039 Hypothyroidism, unspecified: Secondary | ICD-10-CM | POA: Diagnosis not present

## 2018-10-18 DIAGNOSIS — F329 Major depressive disorder, single episode, unspecified: Secondary | ICD-10-CM | POA: Diagnosis not present

## 2018-10-18 DIAGNOSIS — K5790 Diverticulosis of intestine, part unspecified, without perforation or abscess without bleeding: Secondary | ICD-10-CM | POA: Diagnosis not present

## 2018-10-18 DIAGNOSIS — G4733 Obstructive sleep apnea (adult) (pediatric): Secondary | ICD-10-CM | POA: Diagnosis not present

## 2018-10-18 DIAGNOSIS — E669 Obesity, unspecified: Secondary | ICD-10-CM | POA: Diagnosis not present

## 2018-10-19 DIAGNOSIS — I872 Venous insufficiency (chronic) (peripheral): Secondary | ICD-10-CM | POA: Diagnosis not present

## 2018-10-19 DIAGNOSIS — M545 Low back pain: Secondary | ICD-10-CM | POA: Diagnosis not present

## 2018-10-19 DIAGNOSIS — I5032 Chronic diastolic (congestive) heart failure: Secondary | ICD-10-CM | POA: Diagnosis not present

## 2018-10-19 DIAGNOSIS — H401122 Primary open-angle glaucoma, left eye, moderate stage: Secondary | ICD-10-CM | POA: Diagnosis not present

## 2018-10-19 DIAGNOSIS — M069 Rheumatoid arthritis, unspecified: Secondary | ICD-10-CM | POA: Diagnosis not present

## 2018-10-19 DIAGNOSIS — H401113 Primary open-angle glaucoma, right eye, severe stage: Secondary | ICD-10-CM | POA: Diagnosis not present

## 2018-10-19 DIAGNOSIS — E871 Hypo-osmolality and hyponatremia: Secondary | ICD-10-CM | POA: Diagnosis not present

## 2018-10-19 DIAGNOSIS — I110 Hypertensive heart disease with heart failure: Secondary | ICD-10-CM | POA: Diagnosis not present

## 2018-10-19 DIAGNOSIS — H5201 Hypermetropia, right eye: Secondary | ICD-10-CM | POA: Diagnosis not present

## 2018-10-23 DIAGNOSIS — M25512 Pain in left shoulder: Secondary | ICD-10-CM | POA: Diagnosis not present

## 2018-10-23 DIAGNOSIS — I110 Hypertensive heart disease with heart failure: Secondary | ICD-10-CM | POA: Diagnosis not present

## 2018-10-23 DIAGNOSIS — M25552 Pain in left hip: Secondary | ICD-10-CM | POA: Diagnosis not present

## 2018-10-23 DIAGNOSIS — M25511 Pain in right shoulder: Secondary | ICD-10-CM | POA: Diagnosis not present

## 2018-10-23 DIAGNOSIS — I5032 Chronic diastolic (congestive) heart failure: Secondary | ICD-10-CM | POA: Diagnosis not present

## 2018-10-23 DIAGNOSIS — M5489 Other dorsalgia: Secondary | ICD-10-CM | POA: Diagnosis not present

## 2018-10-24 DIAGNOSIS — I872 Venous insufficiency (chronic) (peripheral): Secondary | ICD-10-CM | POA: Diagnosis not present

## 2018-10-24 DIAGNOSIS — M545 Low back pain: Secondary | ICD-10-CM | POA: Diagnosis not present

## 2018-10-24 DIAGNOSIS — I5032 Chronic diastolic (congestive) heart failure: Secondary | ICD-10-CM | POA: Diagnosis not present

## 2018-10-24 DIAGNOSIS — E871 Hypo-osmolality and hyponatremia: Secondary | ICD-10-CM | POA: Diagnosis not present

## 2018-10-24 DIAGNOSIS — I110 Hypertensive heart disease with heart failure: Secondary | ICD-10-CM | POA: Diagnosis not present

## 2018-10-24 DIAGNOSIS — M069 Rheumatoid arthritis, unspecified: Secondary | ICD-10-CM | POA: Diagnosis not present

## 2018-10-26 DIAGNOSIS — E871 Hypo-osmolality and hyponatremia: Secondary | ICD-10-CM | POA: Diagnosis not present

## 2018-10-26 DIAGNOSIS — M069 Rheumatoid arthritis, unspecified: Secondary | ICD-10-CM | POA: Diagnosis not present

## 2018-10-26 DIAGNOSIS — I5032 Chronic diastolic (congestive) heart failure: Secondary | ICD-10-CM | POA: Diagnosis not present

## 2018-10-26 DIAGNOSIS — I110 Hypertensive heart disease with heart failure: Secondary | ICD-10-CM | POA: Diagnosis not present

## 2018-10-26 DIAGNOSIS — M545 Low back pain: Secondary | ICD-10-CM | POA: Diagnosis not present

## 2018-10-26 DIAGNOSIS — I872 Venous insufficiency (chronic) (peripheral): Secondary | ICD-10-CM | POA: Diagnosis not present

## 2018-10-28 DIAGNOSIS — I110 Hypertensive heart disease with heart failure: Secondary | ICD-10-CM | POA: Diagnosis not present

## 2018-10-28 DIAGNOSIS — M545 Low back pain: Secondary | ICD-10-CM | POA: Diagnosis not present

## 2018-10-28 DIAGNOSIS — I5032 Chronic diastolic (congestive) heart failure: Secondary | ICD-10-CM | POA: Diagnosis not present

## 2018-10-28 DIAGNOSIS — M069 Rheumatoid arthritis, unspecified: Secondary | ICD-10-CM | POA: Diagnosis not present

## 2018-10-28 DIAGNOSIS — I872 Venous insufficiency (chronic) (peripheral): Secondary | ICD-10-CM | POA: Diagnosis not present

## 2018-10-28 DIAGNOSIS — E871 Hypo-osmolality and hyponatremia: Secondary | ICD-10-CM | POA: Diagnosis not present

## 2018-10-30 DIAGNOSIS — G4733 Obstructive sleep apnea (adult) (pediatric): Secondary | ICD-10-CM | POA: Diagnosis not present

## 2018-10-30 DIAGNOSIS — M5489 Other dorsalgia: Secondary | ICD-10-CM | POA: Diagnosis not present

## 2018-10-30 DIAGNOSIS — I10 Essential (primary) hypertension: Secondary | ICD-10-CM | POA: Diagnosis not present

## 2018-10-30 DIAGNOSIS — R32 Unspecified urinary incontinence: Secondary | ICD-10-CM | POA: Diagnosis not present

## 2018-10-31 DIAGNOSIS — I5032 Chronic diastolic (congestive) heart failure: Secondary | ICD-10-CM | POA: Diagnosis not present

## 2018-10-31 DIAGNOSIS — M545 Low back pain: Secondary | ICD-10-CM | POA: Diagnosis not present

## 2018-10-31 DIAGNOSIS — E871 Hypo-osmolality and hyponatremia: Secondary | ICD-10-CM | POA: Diagnosis not present

## 2018-10-31 DIAGNOSIS — I872 Venous insufficiency (chronic) (peripheral): Secondary | ICD-10-CM | POA: Diagnosis not present

## 2018-10-31 DIAGNOSIS — I110 Hypertensive heart disease with heart failure: Secondary | ICD-10-CM | POA: Diagnosis not present

## 2018-10-31 DIAGNOSIS — M069 Rheumatoid arthritis, unspecified: Secondary | ICD-10-CM | POA: Diagnosis not present

## 2018-11-01 DIAGNOSIS — D692 Other nonthrombocytopenic purpura: Secondary | ICD-10-CM | POA: Diagnosis not present

## 2018-11-01 DIAGNOSIS — L570 Actinic keratosis: Secondary | ICD-10-CM | POA: Diagnosis not present

## 2018-11-01 DIAGNOSIS — L821 Other seborrheic keratosis: Secondary | ICD-10-CM | POA: Diagnosis not present

## 2018-11-07 DIAGNOSIS — M069 Rheumatoid arthritis, unspecified: Secondary | ICD-10-CM | POA: Diagnosis not present

## 2018-11-07 DIAGNOSIS — M545 Low back pain: Secondary | ICD-10-CM | POA: Diagnosis not present

## 2018-11-07 DIAGNOSIS — I110 Hypertensive heart disease with heart failure: Secondary | ICD-10-CM | POA: Diagnosis not present

## 2018-11-07 DIAGNOSIS — I872 Venous insufficiency (chronic) (peripheral): Secondary | ICD-10-CM | POA: Diagnosis not present

## 2018-11-07 DIAGNOSIS — E871 Hypo-osmolality and hyponatremia: Secondary | ICD-10-CM | POA: Diagnosis not present

## 2018-11-07 DIAGNOSIS — I5032 Chronic diastolic (congestive) heart failure: Secondary | ICD-10-CM | POA: Diagnosis not present

## 2018-11-12 DIAGNOSIS — I110 Hypertensive heart disease with heart failure: Secondary | ICD-10-CM | POA: Diagnosis not present

## 2018-11-12 DIAGNOSIS — I872 Venous insufficiency (chronic) (peripheral): Secondary | ICD-10-CM | POA: Diagnosis not present

## 2018-11-12 DIAGNOSIS — I5032 Chronic diastolic (congestive) heart failure: Secondary | ICD-10-CM | POA: Diagnosis not present

## 2018-11-12 DIAGNOSIS — M545 Low back pain: Secondary | ICD-10-CM | POA: Diagnosis not present

## 2018-11-12 DIAGNOSIS — E871 Hypo-osmolality and hyponatremia: Secondary | ICD-10-CM | POA: Diagnosis not present

## 2018-11-12 DIAGNOSIS — M069 Rheumatoid arthritis, unspecified: Secondary | ICD-10-CM | POA: Diagnosis not present

## 2018-11-27 ENCOUNTER — Institutional Professional Consult (permissible substitution): Payer: Self-pay | Admitting: Neurology

## 2018-12-06 DIAGNOSIS — H353114 Nonexudative age-related macular degeneration, right eye, advanced atrophic with subfoveal involvement: Secondary | ICD-10-CM | POA: Diagnosis not present

## 2018-12-06 DIAGNOSIS — H43813 Vitreous degeneration, bilateral: Secondary | ICD-10-CM | POA: Diagnosis not present

## 2018-12-06 DIAGNOSIS — H353122 Nonexudative age-related macular degeneration, left eye, intermediate dry stage: Secondary | ICD-10-CM | POA: Diagnosis not present

## 2018-12-06 DIAGNOSIS — H43393 Other vitreous opacities, bilateral: Secondary | ICD-10-CM | POA: Diagnosis not present

## 2018-12-06 DIAGNOSIS — Z23 Encounter for immunization: Secondary | ICD-10-CM | POA: Diagnosis not present

## 2018-12-14 ENCOUNTER — Other Ambulatory Visit

## 2018-12-14 ENCOUNTER — Other Ambulatory Visit: Payer: Medicare Other

## 2018-12-21 ENCOUNTER — Ambulatory Visit: Admitting: Cardiology

## 2018-12-25 ENCOUNTER — Institutional Professional Consult (permissible substitution): Payer: Self-pay | Admitting: Neurology

## 2018-12-25 ENCOUNTER — Telehealth: Payer: Self-pay | Admitting: Radiology

## 2018-12-25 NOTE — Telephone Encounter (Signed)
Patient is calling states that she hasn't seen you in a while (12/08/2014, notes in [facility]) and she is having low back pain again and she would like to come in and discuss what is going on with her with you.  She said that you helped her so much before.  Please advise

## 2018-12-25 NOTE — Telephone Encounter (Signed)
ov

## 2018-12-26 NOTE — Telephone Encounter (Signed)
lmom for pt to call back to sched appt.

## 2018-12-28 NOTE — Telephone Encounter (Signed)
sched for 01/15/2019

## 2019-01-01 ENCOUNTER — Other Ambulatory Visit: Payer: Self-pay

## 2019-01-01 ENCOUNTER — Ambulatory Visit (INDEPENDENT_AMBULATORY_CARE_PROVIDER_SITE_OTHER): Payer: Medicare Other

## 2019-01-01 DIAGNOSIS — I714 Abdominal aortic aneurysm, without rupture, unspecified: Secondary | ICD-10-CM

## 2019-01-03 ENCOUNTER — Other Ambulatory Visit: Payer: Self-pay | Admitting: Cardiology

## 2019-01-03 DIAGNOSIS — I714 Abdominal aortic aneurysm, without rupture, unspecified: Secondary | ICD-10-CM

## 2019-01-08 ENCOUNTER — Other Ambulatory Visit: Payer: Self-pay

## 2019-01-08 ENCOUNTER — Encounter: Payer: Self-pay | Admitting: Cardiology

## 2019-01-08 ENCOUNTER — Ambulatory Visit (INDEPENDENT_AMBULATORY_CARE_PROVIDER_SITE_OTHER): Payer: Medicare Other | Admitting: Cardiology

## 2019-01-08 VITALS — BP 139/64 | HR 66 | Temp 96.2°F | Ht 63.0 in | Wt 202.0 lb

## 2019-01-08 DIAGNOSIS — I10 Essential (primary) hypertension: Secondary | ICD-10-CM

## 2019-01-08 DIAGNOSIS — I2510 Atherosclerotic heart disease of native coronary artery without angina pectoris: Secondary | ICD-10-CM | POA: Diagnosis not present

## 2019-01-08 DIAGNOSIS — I714 Abdominal aortic aneurysm, without rupture, unspecified: Secondary | ICD-10-CM

## 2019-01-08 DIAGNOSIS — E7800 Pure hypercholesterolemia, unspecified: Secondary | ICD-10-CM | POA: Diagnosis not present

## 2019-01-08 DIAGNOSIS — R0602 Shortness of breath: Secondary | ICD-10-CM | POA: Diagnosis not present

## 2019-01-08 DIAGNOSIS — I350 Nonrheumatic aortic (valve) stenosis: Secondary | ICD-10-CM | POA: Diagnosis not present

## 2019-01-08 NOTE — Progress Notes (Signed)
Primary Physician:  [name], MD   Patient ID: [name], female    DOB: Dec 02, 1932, 85 y.o.   MRN: [id number]  Subjective:    Chief Complaint  Patient presents with  . AAA  . Follow-up    6 month  . Results    echo    HPI: TONIE RATTE  is a 85 y.o. female  with past medical history of obesity, chronic diastolic CHF, hypertensive heart disease, HTN, anemia, hemorrhoids, depression, diverticulosis, esophageal reflux, OAB, OSA intolerant to CPAP on nocturnal 02, rheumatoid arthritis, and venous insufficiency. CT scan of the chest 03/2016 without definitive ILD. LM and 2 vessel CAD, mild calcification on mitral and aortic valve. Also has small to moderate sized AAA. Underwent repeat abdominal aortic duplex and is here to discuss results.  Since last seen in February 2020, patient is overall doing well.  She has noticed some shortness of breath over the last few weeks with exertion.  Also has some occasional chest discomfort mostly at rest that last for a few seconds to 1 to 2 minutes.  No pain radiation or associated shortness of breath.  She has had a few episodes on exertion, but did not stop her activity.  She does admit to not being as active over the last several months in view of COVID.  Blood pressure has been well controlled, tolerating medications well.  She states that her PCP has stopped her aspirin when she started meloxicam.  States that she previously had major GI bleeding with NSAIDs in the past.  She is to follow-up with orthopedics in the next few weeks, as she has been having worsening right hip pain.  She previously was on Livalo for hyperlipidemia that she tolerated well, but was unable to afford this.  She has been intolerant to several statins in the past. Daughter is present at bedside.   Past Medical History:  Diagnosis Date  . Anemia    h/o hemorrhoidal bleeding and blood transfusion  . Cardiomegaly   . Chronic diastolic CHF (congestive  heart failure) ([place])   . Depression   . Diverticulosis   . DOE (dyspnea on exertion)   . Esophageal reflux   . GERD (gastroesophageal reflux disease)   . Hypothyroidism   . Insomnia   . LBP (low back pain)   . OAB (overactive bladder)   . Obesity   . OSA (obstructive sleep apnea)   . Osteoarthritis   . Rheumatoid arthritis(714.0)   . Shoulder pain, bilateral   . Unspecified essential hypertension   . Venous insufficiency     Past Surgical History:  Procedure Laterality Date  . APPENDECTOMY  1953  . bladder abduction-1996  1996  . breast biopsy Right 1980  . [place]  . COSMETIC SURGERY  1996  . CYSTOCELE REPAIR    . FLEXIBLE SIGMOIDOSCOPY N/A 04/10/2015   Procedure: FLEXIBLE SIGMOIDOSCOPY;  Surgeon: Arta Silence, MD;  Location: [facility] ENDOSCOPY;  Service: Endoscopy;  Laterality: N/A;  . knee arthroscopy Right 1996, 2010  . KNEE ARTHROSCOPY W/ AUTOGENOUS CARTILAGE IMPLANTATION (ACI) PROCEDURE Left 1994, 1995  . SHOULDER SURGERY  1990  . TOTAL ABDOMINAL HYSTERECTOMY  1972  . VESICOVAGINAL FISTULA CLOSURE W/ TAH    . WRIST SURGERY  1967    Social History   Socioeconomic History  . Marital status: Widowed    Spouse name: Not on file  . Number of children: 3  . Years of education:  College  . Highest education level: Not on file  Occupational History  . Occupation: Retired  Scientific laboratory technician  . Financial resource strain: Not on file  . Food insecurity    Worry: Not on file    Inability: Not on file  . Transportation needs    Medical: Not on file    Non-medical: Not on file  Tobacco Use  . Smoking status: Former Smoker    Packs/day: 0.20    Years: 10.00    Pack years: 2.00    Types: Cigarettes    Quit date: 04/11/1984    Years since quitting: 34.7  . Smokeless tobacco: Never Used  Substance and Sexual Activity  . Alcohol use: Yes    Alcohol/week: 0.0 standard drinks    Comment: cocktail daily  . Drug use: No  . Sexual activity: Not on  file  Lifestyle  . Physical activity    Days per week: Not on file    Minutes per session: Not on file  . Stress: Not on file  Relationships  . Social Herbalist on phone: Not on file    Gets together: Not on file    Attends religious service: Not on file    Active member of club or organization: Not on file    Attends meetings of clubs or organizations: Not on file    Relationship status: Not on file  . Intimate partner violence    Fear of current or ex partner: Not on file    Emotionally abused: Not on file    Physically abused: Not on file    Forced sexual activity: Not on file  Other Topics Concern  . 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  Eyes: Negative for visual disturbance.  Cardiovascular: Positive for dyspnea on exertion. Negative for chest pain, claudication, leg swelling, orthopnea, palpitations and syncope.  Respiratory: Negative for hemoptysis and wheezing.   Endocrine: Negative for cold intolerance and heat intolerance.  Hematologic/Lymphatic: Does not bruise/bleed easily.  Skin: Negative for nail changes.  Musculoskeletal: Positive for back pain and joint pain (bilateral knees). Negative for muscle weakness and myalgias.  Gastrointestinal: Negative for abdominal pain, change in bowel habit, nausea and vomiting.  Neurological: Negative for difficulty with concentration, dizziness, focal weakness and headaches.  Psychiatric/Behavioral: Negative for altered mental status and suicidal ideas.  All other systems reviewed and are negative.     Objective:  Blood pressure 139/64, pulse 66, temperature (!) 96.2 F (35.7 C), height 5\' 3"  (1.6 m),  weight 202 lb (91.6 kg), SpO2 96 %. Body mass index is 35.78 kg/m.    Physical Exam  Constitutional: She is oriented to person, place, and time. Vital signs are normal. She appears well-developed and well-nourished.  HENT:  Head: Normocephalic and atraumatic.  Neck: Normal range of motion.  Cardiovascular: Normal rate, regular rhythm and intact distal pulses.  Murmur heard.  Harsh early systolic murmur is present with a grade of 3/6 at the upper right sternal border radiating to the apex. Pulses:      Femoral pulses are 1+ on the right side and 1+ on the left  side.      Popliteal pulses are 1+ on the right side and 1+ on the left side.       Dorsalis pedis pulses are 2+ on the right side and 2+ on the left side.       Posterior tibial pulses are 2+ on the right side and 2+ on the left side.  Pulmonary/Chest: Effort normal and breath sounds normal. No accessory muscle usage. No respiratory distress.  Abdominal: Soft. Bowel sounds are normal.  Musculoskeletal: Normal range of motion.  Neurological: She is alert and oriented to person, place, and time.  Skin: Skin is warm and dry.  Vitals reviewed.  Radiology: No results found.  Laboratory examination:    CMP Latest Ref Rng & Units 09/17/2018 09/16/2018 09/15/2018  Glucose 70 - 99 mg/dL 84 88 131(H)  BUN 8 - 23 mg/dL 12 14 14   Creatinine 0.44 - 1.00 mg/dL 1.01(H) 0.78 0.81  Sodium 135 - 145 mmol/L 135 133(L) 129(L)  Potassium 3.5 - 5.1 mmol/L 4.4 4.4 4.6  Chloride 98 - 111 mmol/L 101 102 98  CO2 22 - 32 mmol/L 25 24 20(L)  Calcium 8.9 - 10.3 mg/dL 9.0 8.8(L) 8.6(L)  Total Protein 6.5 - 8.1 g/dL - - -  Total Bilirubin 0.3 - 1.2 mg/dL - - -  Alkaline Phos 38 - 126 U/L - - -  AST 15 - 41 U/L - - -  ALT 0 - 44 U/L - - -   CBC Latest Ref Rng & Units 09/14/2018 09/13/2018 01/28/2016  WBC 4.0 - 10.5 K/uL 8.3 7.3 9.5  Hemoglobin 12.0 - 15.0 g/dL 12.2 11.7(L) 12.1  Hematocrit 36.0 - 46.0 % 34.4(L) 32.7(L) 37.0  Platelets 150 - 400 K/uL 297  272 335   Lipid Panel     Component Value Date/Time   CHOL 226 (HH) 12/19/2006 0916   TRIG 132 12/19/2006 0916   HDL 43.9 12/19/2006 0916   CHOLHDL 5.1 CALC 12/19/2006 0916   VLDL 26 12/19/2006 0916   LDLDIRECT 159.2 12/19/2006 0916   HEMOGLOBIN A1C No results found for: HGBA1C, MPG TSH No results for input(s): TSH in the last 8760 hours.  PRN Meds:. There are no discontinued medications. Current Meds  Medication Sig  . busPIRone (BUSPAR) 7.5 MG tablet Take 1 tablet (7.5 mg total) by mouth daily as needed (for anxiety).  . carvedilol (COREG) 25 MG tablet Take 25 mg by mouth 2 (two) times daily.  . Cholecalciferol (VITAMIN D3) 125 MCG (5000 UT) CAPS Take 5,000 Units by mouth daily with breakfast.  . diclofenac sodium (VOLTAREN) 1 % GEL Apply 2.25 g topically 3 (three) times daily as needed.  . fluticasone (FLONASE) 50 MCG/ACT nasal spray Place 2 sprays into both nostrils daily as needed for allergies or rhinitis.   . furosemide (LASIX) 20 MG tablet Take 1 tablet (20 mg total) by mouth daily. (Patient taking differently: Take 20 mg by mouth daily as needed for fluid or edema. )  . hydrALAZINE (APRESOLINE) 25 MG tablet TAKE 1 TABLET 3 TIMES A DAY MAKE APPT WITH DR Meda Coffee (Patient taking differently: Take 25 mg by mouth 2 (two) times a day. )  . HYDROcodone-acetaminophen (NORCO) 7.5-325 MG tablet Take 1 tablet by mouth every 6 (six) hours as needed for moderate pain.  . hydrocortisone (ANUSOL-HC) 2.5 % rectal cream Apply 1 application topically 4 (four) times daily as needed for hemorrhoids.  Marland Kitchen latanoprost (XALATAN) 0.005 % ophthalmic solution Place 1 drop into both eyes at bedtime.  Marland Kitchen  levothyroxine (SYNTHROID, LEVOTHROID) 100 MCG tablet Take 100 mcg by mouth daily before breakfast.  . loratadine (CLARITIN) 10 MG tablet Take 1 tablet (10 mg total) by mouth daily as needed for allergies.  Marland Kitchen losartan (COZAAR) 100 MG tablet Take 100 mg by mouth 2 (two) times daily.   . meloxicam (MOBIC) 15  MG tablet Take 1 tablet by mouth daily.  . Multiple Vitamin (MULTI-DAY VITAMINS PO) Take 1 tablet by mouth daily.   Marland Kitchen omeprazole (PRILOSEC) 20 MG capsule Take 20 mg by mouth 2 (two) times daily before a meal.   . rOPINIRole (REQUIP) 1 MG tablet Take 1 mg by mouth See admin instructions. Take 1 mg by mouth once a day between 3 PM-4 PM and an additional 1 mg in the evening, if no relief    Cardiac Studies:   Abdominal Aortic Duplex  01/01/2019: Moderate dilatation of the abdominal aorta is noted in the proximal, mid and distal aorta. An abdominal aortic aneurysm measuring 4.5 x 4.52 x 4.52 cm is seen.  Recheck in 6 months for stability of the aneurysm.  Echocardiogram 09/27/2017: Left ventricle cavity is normal in size. Moderate concentric hypertrophy of the left ventricle. Low normal LV systolic function without regional wall motion abnormality. Visual EF is 50-55%. Doppler evidence of grade II (pseudonormal) diastolic dysfunction. Diastolic dysfunction findings suggests elevated LA/LV end diastolic pressure. Left atrial cavity is moderately dilated at 4.5 cm. Mild calcification of the aortic valve annulus. Mild aortic valve leaflet calcification. Mildly restricted aortic valve leaflets. Mild aortic valve stenosis. Aortic valve peak pressure gradient of 26 and mean gradient of 15 mmHg, calculated aortic valve area 1.07cm. Trace aortic regurgitation. Mild (Grade I) mitral regurgitation. Normal mitral valve leaflet mobility. Mild tricuspid regurgitation. No evidence of pulmonary hypertension.  Lexiscan myoview stress test 12/08/2017: 1. Lexiscan stress test was performed. Exercise capacity was not assessed. Stress symptoms included abdominal pain. Blood pressure was normal. The resting and stress electrocardiogram demonstrated normal sinus rhythm, normal resting conduction, no resting arrhythmias and normal rest repolarization. 2. The overall quality of the study is good. There is no evidence of  abnormal lung activity. Stress and rest SPECT images demonstrate homogeneous tracer distribution throughout the myocardium. Gated SPECT imaging reveals normal myocardial thickening and wall motion. The left ventricular ejection fraction was normal (54%). 3. Low risk study  Renal artery duplex 09/27/2017: Hemodynamically significant stenosis bilaterally. Renal length is within normal limits for both kidneys. There is an incidental 3.9x3.4x3.4 cm proximal and mid abdominal aortic aneurysm. Recommend dedicated abdominal aortic duplex scan to further define AAA.  Assessment:   AAA (abdominal aortic aneurysm) without rupture ([place]) - Plan: EKG 12-Lead  Resistant hypertension  Mild aortic stenosis - Plan: PCV ECHOCARDIOGRAM COMPLETE  Hyperlipidemia, group A  Coronary artery calcification seen on CAT scan  Shortness of breath - Plan: PCV ECHOCARDIOGRAM COMPLETE  EKG 01/08/2019: Normal sinus rhythm at 63 bpm with first degree AV block, normal axis, no evidence of ischemia.   Recommendations:   Patient is here for follow-up to discuss abdominal aortic duplex.  She has had slight progression of aortic aneurysm to 4.5 cm.  She will need repeat duplex in 6 months.  She is complaining of shortness of breath, question if related to deconditioning and obesity.  She does have mild aortic stenosis by previous echocardiogram.  I will repeat her echocardiogram for surveillance of this.  She does have some occasional chest heaviness, has had negative nuclear stress testing in 2019.  I have encouraged  her to be more active to see if this will improve, her symptoms are mostly at rest and not with exertion.  Again suspect related to deconditioning.  Her blood pressure is well controlled.  She previously tolerated Livalo well, but was unable to afford the medication.  She has not been able to tolerate statins in the past.  We will try Nexlitol for improved LDL reduction and will try to obtain approval of this.   She is currently not taking aspirin in view of starting meloxicam for arthritis.  She is to follow-up with orthopedics for worsening hip pain.  If she were to stop meloxicam, would recommend resuming aspirin.  She has a history of major GI bleeding in the past with NSAIDS.  I will see her back after echocardiogram.  Miquel Dunn, MSN, APRN, FNP-C [facility]. [place] Office: [phone] Fax: [phone]

## 2019-01-15 ENCOUNTER — Ambulatory Visit (INDEPENDENT_AMBULATORY_CARE_PROVIDER_SITE_OTHER): Payer: Medicare Other | Admitting: Physical Medicine and Rehabilitation

## 2019-01-15 ENCOUNTER — Encounter: Payer: Self-pay | Admitting: Physical Medicine and Rehabilitation

## 2019-01-15 ENCOUNTER — Telehealth: Payer: Self-pay | Admitting: Physical Medicine and Rehabilitation

## 2019-01-15 VITALS — BP 179/96 | HR 61

## 2019-01-15 DIAGNOSIS — M47816 Spondylosis without myelopathy or radiculopathy, lumbar region: Secondary | ICD-10-CM

## 2019-01-15 DIAGNOSIS — G8929 Other chronic pain: Secondary | ICD-10-CM

## 2019-01-15 DIAGNOSIS — M545 Low back pain: Secondary | ICD-10-CM

## 2019-01-15 DIAGNOSIS — M25552 Pain in left hip: Secondary | ICD-10-CM

## 2019-01-15 DIAGNOSIS — M7062 Trochanteric bursitis, left hip: Secondary | ICD-10-CM | POA: Diagnosis not present

## 2019-01-15 DIAGNOSIS — I2510 Atherosclerotic heart disease of native coronary artery without angina pectoris: Secondary | ICD-10-CM | POA: Diagnosis not present

## 2019-01-15 DIAGNOSIS — F1190 Opioid use, unspecified, uncomplicated: Secondary | ICD-10-CM

## 2019-01-15 DIAGNOSIS — M4316 Spondylolisthesis, lumbar region: Secondary | ICD-10-CM

## 2019-01-15 NOTE — Telephone Encounter (Signed)
No PA is needed for [id number], pt has medicare.

## 2019-01-15 NOTE — Progress Notes (Signed)
Lower back and posterior left hip pain. Lying on left side makes pain worse. Hot shower, medication help with pain. Numeric Pain Rating Scale and Functional Assessment Average Pain 9 Pain Right Now 5 My pain is intermittent, sharp and aching Pain is worse with: lying on left side Pain improves with: heat/ice and medication   In the last MONTH (on 0-10 scale) has pain interfered with the following?  1. General activity like being  able to carry out your everyday physical activities such as walking, climbing stairs, carrying groceries, or moving a chair?  Rating(6)  2. Relation with others like being able to carry out your usual social activities and roles such as  activities at home, at work and in your community. Rating(6)  3. Enjoyment of life such that you have  been bothered by emotional problems such as feeling anxious, depressed or irritable?  Rating(4)

## 2019-01-17 ENCOUNTER — Other Ambulatory Visit: Payer: Medicare Other

## 2019-01-25 ENCOUNTER — Ambulatory Visit (INDEPENDENT_AMBULATORY_CARE_PROVIDER_SITE_OTHER): Payer: Medicare Other

## 2019-01-25 ENCOUNTER — Other Ambulatory Visit: Payer: Self-pay

## 2019-01-25 DIAGNOSIS — I350 Nonrheumatic aortic (valve) stenosis: Secondary | ICD-10-CM | POA: Diagnosis not present

## 2019-01-25 DIAGNOSIS — R0602 Shortness of breath: Secondary | ICD-10-CM

## 2019-01-30 ENCOUNTER — Other Ambulatory Visit: Payer: Self-pay

## 2019-01-30 ENCOUNTER — Ambulatory Visit: Payer: Self-pay

## 2019-01-30 ENCOUNTER — Ambulatory Visit (INDEPENDENT_AMBULATORY_CARE_PROVIDER_SITE_OTHER): Payer: Medicare Other | Admitting: Physical Medicine and Rehabilitation

## 2019-01-30 ENCOUNTER — Encounter: Payer: Self-pay | Admitting: Physical Medicine and Rehabilitation

## 2019-01-30 VITALS — BP 164/76 | HR 61

## 2019-01-30 DIAGNOSIS — M7062 Trochanteric bursitis, left hip: Secondary | ICD-10-CM

## 2019-01-30 DIAGNOSIS — M47816 Spondylosis without myelopathy or radiculopathy, lumbar region: Secondary | ICD-10-CM

## 2019-01-30 MED ORDER — METHYLPREDNISOLONE ACETATE 80 MG/ML IJ SUSP
80.0000 mg | Freq: Once | INTRAMUSCULAR | Status: AC
  Administered 2019-01-30: 80 mg

## 2019-01-30 NOTE — Progress Notes (Signed)
 .  Numeric Pain Rating Scale and Functional Assessment Average Pain 8   In the last MONTH (on 0-10 scale) has pain interfered with the following?  1. General activity like being  able to carry out your everyday physical activities such as walking, climbing stairs, carrying groceries, or moving a chair?  Rating(8)   +Driver, -BT, -Dye Allergies.  

## 2019-02-01 DIAGNOSIS — I5032 Chronic diastolic (congestive) heart failure: Secondary | ICD-10-CM | POA: Diagnosis not present

## 2019-02-01 DIAGNOSIS — R269 Unspecified abnormalities of gait and mobility: Secondary | ICD-10-CM | POA: Diagnosis not present

## 2019-02-01 DIAGNOSIS — K219 Gastro-esophageal reflux disease without esophagitis: Secondary | ICD-10-CM | POA: Diagnosis not present

## 2019-02-01 DIAGNOSIS — I350 Nonrheumatic aortic (valve) stenosis: Secondary | ICD-10-CM | POA: Diagnosis not present

## 2019-02-01 DIAGNOSIS — I110 Hypertensive heart disease with heart failure: Secondary | ICD-10-CM | POA: Diagnosis not present

## 2019-02-01 DIAGNOSIS — N3281 Overactive bladder: Secondary | ICD-10-CM | POA: Diagnosis not present

## 2019-02-01 DIAGNOSIS — G4733 Obstructive sleep apnea (adult) (pediatric): Secondary | ICD-10-CM | POA: Diagnosis not present

## 2019-02-01 DIAGNOSIS — E785 Hyperlipidemia, unspecified: Secondary | ICD-10-CM | POA: Diagnosis not present

## 2019-02-08 DIAGNOSIS — M19011 Primary osteoarthritis, right shoulder: Secondary | ICD-10-CM | POA: Diagnosis not present

## 2019-02-08 DIAGNOSIS — M6281 Muscle weakness (generalized): Secondary | ICD-10-CM | POA: Diagnosis not present

## 2019-02-08 DIAGNOSIS — M25511 Pain in right shoulder: Secondary | ICD-10-CM | POA: Diagnosis not present

## 2019-02-11 ENCOUNTER — Other Ambulatory Visit: Payer: Self-pay

## 2019-02-11 ENCOUNTER — Encounter: Payer: Self-pay | Admitting: Cardiology

## 2019-02-11 ENCOUNTER — Ambulatory Visit (INDEPENDENT_AMBULATORY_CARE_PROVIDER_SITE_OTHER): Payer: Medicare Other | Admitting: Cardiology

## 2019-02-11 VITALS — BP 160/72 | HR 67 | Temp 97.5°F | Ht 63.0 in | Wt 208.1 lb

## 2019-02-11 DIAGNOSIS — E7800 Pure hypercholesterolemia, unspecified: Secondary | ICD-10-CM | POA: Diagnosis not present

## 2019-02-11 DIAGNOSIS — I714 Abdominal aortic aneurysm, without rupture, unspecified: Secondary | ICD-10-CM

## 2019-02-11 DIAGNOSIS — I10 Essential (primary) hypertension: Secondary | ICD-10-CM

## 2019-02-11 DIAGNOSIS — R0602 Shortness of breath: Secondary | ICD-10-CM

## 2019-02-11 DIAGNOSIS — I350 Nonrheumatic aortic (valve) stenosis: Secondary | ICD-10-CM | POA: Diagnosis not present

## 2019-02-11 DIAGNOSIS — I2510 Atherosclerotic heart disease of native coronary artery without angina pectoris: Secondary | ICD-10-CM

## 2019-02-11 MED ORDER — NEXLIZET 180-10 MG PO TABS: 1.0000 | ORAL_TABLET | Freq: Every day | ORAL | 3 refills | Status: DC

## 2019-02-11 MED ORDER — ISOSORBIDE DINITRATE 30 MG PO TABS: 30.0000 mg | ORAL_TABLET | Freq: Three times a day (TID) | ORAL | 2 refills | Status: DC

## 2019-02-11 MED ORDER — AMLODIPINE BESYLATE 5 MG PO TABS: 5.0000 mg | ORAL_TABLET | Freq: Every day | ORAL | 2 refills | Status: DC

## 2019-02-11 NOTE — Progress Notes (Signed)
Primary Physician:  [name], MD   [name], female    DOB: March 01, 1933, 85 y.o.   MRN: [id number]  Subjective:    Chief Complaint  Patient presents with   Shortness of Breath   Results    echo   Follow-up    HPI: Lisa Mcclure  is a 85 y.o. female  with past medical history of obesity, chronic diastolic CHF, hypertensive heart disease, HTN, anemia, hemorrhoids, depression, diverticulosis, esophageal reflux, OAB, OSA intolerant to CPAP on nocturnal 02, rheumatoid arthritis, and venous insufficiency. CT scan of the chest 03/2016 without definitive ILD. LM and 2 vessel CAD, mild calcification on mitral and aortic valve. Also has  moderate sized AAA.   She has been intolerant to several statins in the past, I had started her on Nexlitol and now presents for f/u. She is tolerating this well and states she is worried about the AAA expansion. Daughter is present at bedside. Except for chronic dyspnea, no other specific complaints.   Past Medical History:  Diagnosis Date   Anemia    h/o hemorrhoidal bleeding and blood transfusion   Cardiomegaly    Chronic diastolic CHF (congestive heart failure) (HCC)    Depression    Diverticulosis    DOE (dyspnea on exertion)    Esophageal reflux    GERD (gastroesophageal reflux disease)    Hypothyroidism    Insomnia    LBP (low back pain)    OAB (overactive bladder)    Obesity    OSA (obstructive sleep apnea)    Osteoarthritis    Rheumatoid arthritis(714.0)    Shoulder pain, bilateral    Unspecified essential hypertension    Venous insufficiency     Past Surgical History:  Procedure Laterality Date   APPENDECTOMY  1953   bladder abduction-1996  1996   breast biopsy Right 1980   CESAREAN SECTION     1957, 1961, 1964   [place] N/A 04/10/2015   Procedure: FLEXIBLE SIGMOIDOSCOPY;  Surgeon: Arta Silence, MD;   Location: [facility] ENDOSCOPY;  Service: Endoscopy;  Laterality: N/A;   knee arthroscopy Right 1996, 2010   KNEE ARTHROSCOPY W/ AUTOGENOUS CARTILAGE IMPLANTATION (ACI) PROCEDURE Left 1994, [place]   VESICOVAGINAL FISTULA CLOSURE W/ TAH     WRIST SURGERY  1967    Social History   Socioeconomic History   Marital status: Widowed    Spouse name: Not on file   Number of children: 3   Years of education: College   Highest education level: Not on file  Occupational History   Occupation: Retired  Scientist, product/process development strain: Not on file   Food insecurity    Worry: Not on file    Inability: Not on Lexicographer needs    Medical: Not on file    Non-medical: Not on file  Tobacco Use   Smoking status: Former Smoker    Packs/day: 0.20    Years: 10.00    Pack years: 2.00    Types: Cigarettes    Quit date: 04/11/1984    Years since quitting: 34.8   Smokeless tobacco: Never Used  Substance and Sexual Activity   Alcohol use: Yes    Alcohol/week: 0.0 standard drinks    Comment: cocktail daily   Drug use: No   Sexual activity: Not on file  Lifestyle   Physical activity    Days per week: Not on file    Minutes per session: Not on file   Stress: Not on file  Relationships   Social connections    Talks on phone: Not on file    Gets together: Not on file    Attends religious service: Not on file    Active member of club or organization: Not on file    Attends meetings of clubs or organizations: Not on file    Relationship status: Not on file   Intimate partner violence    Fear of current or ex partner: Not on file    Emotionally abused: Not on file    Physically abused: Not on file    Forced sexual activity: Not on file  Other Topics Concern   Not on file  Social History Narrative   Widowed   Lives alone   3 children, 2 living   OCCUPATION: retired from Physicist, medical, 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  Eyes: Negative for visual disturbance.  Cardiovascular: Positive for dyspnea on exertion. Negative for chest pain, claudication, leg swelling, orthopnea, palpitations and syncope.  Respiratory: Negative for hemoptysis and wheezing.   Endocrine: Negative for cold intolerance and heat intolerance.  Hematologic/Lymphatic: Does not bruise/bleed easily.  Skin: Negative for nail changes.  Musculoskeletal: Positive for back pain and joint pain (bilateral knees). Negative for muscle weakness and myalgias.  Gastrointestinal: Negative for abdominal pain, change in bowel habit, nausea and vomiting.  Neurological: Negative for difficulty with concentration, dizziness, focal weakness and headaches.  Psychiatric/Behavioral: Negative for altered mental status and suicidal ideas.  All other systems reviewed and are negative.  Objective:   Vitals with BMI 02/11/2019 01/30/2019 01/15/2019  Height 5\' 3"  - -  Weight 208 lbs 2 oz - -  BMI 99991111 - -  Systolic 0000000 [id number] 0000000  Diastolic 72 76 96  Pulse 67 61 61    Physical Exam  Constitutional: She is oriented to person, place, and time. Vital signs are normal. She appears well-developed and well-nourished.  HENT:  Head: Normocephalic and atraumatic.  Neck: Normal range of motion.  Cardiovascular: Normal rate, regular rhythm and intact distal pulses.  Murmur heard.  Harsh early systolic murmur is present with a grade of 3/6 at the upper right sternal border radiating to the apex. Pulses:      Femoral pulses are 1+ on the right side and 1+ on the left side.      Popliteal pulses are 1+ on the right side and 1+ on the left side.       Dorsalis pedis pulses are 2+ on the right side and 2+ on the left side.       Posterior tibial pulses are 2+ on  the right side and 2+ on the left side.  Pulmonary/Chest: Effort normal and breath sounds normal. No accessory muscle usage. No respiratory distress.  Abdominal: Soft. Bowel sounds are normal.  Musculoskeletal: Normal range of motion.  Neurological: She is alert and oriented to person, place, and time.  Skin: Skin is warm and dry.  Vitals reviewed.  Radiology: No results found.  Laboratory examination:    CMP Latest Ref Rng & Units 09/17/2018 09/16/2018 09/15/2018  Glucose 70 - 99 mg/dL 84 88  131(H)  BUN 8 - 23 mg/dL 12 14 14   Creatinine 0.44 - 1.00 mg/dL 1.01(H) 0.78 0.81  Sodium 135 - 145 mmol/L 135 133(L) 129(L)  Potassium 3.5 - 5.1 mmol/L 4.4 4.4 4.6  Chloride 98 - 111 mmol/L 101 102 98  CO2 22 - 32 mmol/L 25 24 20(L)  Calcium 8.9 - 10.3 mg/dL 9.0 8.8(L) 8.6(L)  Total Protein 6.5 - 8.1 g/dL - - -  Total Bilirubin 0.3 - 1.2 mg/dL - - -  Alkaline Phos 38 - 126 U/L - - -  AST 15 - 41 U/L - - -  ALT 0 - 44 U/L - - -   CBC Latest Ref Rng & Units 09/14/2018 09/13/2018 01/28/2016  WBC 4.0 - 10.5 K/uL 8.3 7.3 9.5  Hemoglobin 12.0 - 15.0 g/dL 12.2 11.7(L) 12.1  Hematocrit 36.0 - 46.0 % 34.4(L) 32.7(L) 37.0  Platelets 150 - 400 K/uL 297 272 335   Lipid Panel     Component Value Date/Time   CHOL 226 (HH) 12/19/2006 0916   TRIG 132 12/19/2006 0916   HDL 43.9 12/19/2006 0916   CHOLHDL 5.1 CALC 12/19/2006 0916   VLDL 26 12/19/2006 0916   LDLDIRECT 159.2 12/19/2006 0916   HEMOGLOBIN A1C No results found for: HGBA1C, MPG TSH No results for input(s): TSH in the last 8760 hours.  PRN Meds:. Medications Discontinued During This Encounter  Medication Reason   loratadine (CLARITIN) 10 MG tablet Patient Preference   meloxicam (MOBIC) 15 MG tablet Discontinued by provider   senna-docusate (SENOKOT-S) 8.6-50 MG tablet Patient Preference   Current Meds  Medication Sig   aspirin EC 81 MG tablet Take 81 mg by mouth daily.   busPIRone (BUSPAR) 7.5 MG tablet Take 1 tablet (7.5 mg total)  by mouth daily as needed (for anxiety).   carvedilol (COREG) 25 MG tablet Take 25 mg by mouth 2 (two) times daily.   Cholecalciferol (VITAMIN D3) 125 MCG (5000 UT) CAPS Take 5,000 Units by mouth daily with breakfast.   diclofenac sodium (VOLTAREN) 1 % GEL Apply 2.25 g topically 3 (three) times daily as needed.   fluticasone (FLONASE) 50 MCG/ACT nasal spray Place 2 sprays into both nostrils daily as needed for allergies or rhinitis.    furosemide (LASIX) 20 MG tablet Take 1 tablet (20 mg total) by mouth daily. (Patient taking differently: Take 20 mg by mouth daily as needed for fluid or edema. )   hydrALAZINE (APRESOLINE) 25 MG tablet TAKE 1 TABLET 3 TIMES A DAY MAKE APPT WITH DR Meda Coffee (Patient taking differently: Take 25 mg by mouth 2 (two) times a day. )   HYDROcodone-acetaminophen (NORCO) 7.5-325 MG tablet Take 1 tablet by mouth every 6 (six) hours as needed for moderate pain.   hydrocortisone (ANUSOL-HC) 2.5 % rectal cream Apply 1 application topically 4 (four) times daily as needed for hemorrhoids.   latanoprost (XALATAN) 0.005 % ophthalmic solution Place 1 drop into both eyes at bedtime.   levothyroxine (SYNTHROID, LEVOTHROID) 100 MCG tablet Take 100 mcg by mouth daily before breakfast.   losartan (COZAAR) 100 MG tablet Take 100 mg by mouth 2 (two) times daily.    modafinil (PROVIGIL) 100 MG tablet Take 100 mg by mouth daily.   Multiple Vitamin (MULTI-DAY VITAMINS PO) Take 1 tablet by mouth daily.    Multiple Vitamins-Minerals (ICAPS AREDS 2 PO) Take by mouth 2 (two) times daily.   omeprazole (PRILOSEC) 20 MG capsule Take 20 mg by mouth 2 (two) times daily before a meal.  rOPINIRole (REQUIP) 1 MG tablet Take 1 mg by mouth See admin instructions. Take 1 mg by mouth once a day between 3 PM-4 PM and an additional 1 mg in the evening, if no relief   Current Facility-Administered Medications for the 02/11/19 encounter (Office Visit) with Adrian Prows, MD  Medication    methylPREDNISolone acetate (DEPO-MEDROL) injection 80 mg    Cardiac Studies:   Lexiscan myoview stress test 12/08/2017: 1. Lexiscan stress test was performed. Exercise capacity was not assessed. Stress symptoms included abdominal pain. Blood pressure was normal. The resting and stress electrocardiogram demonstrated normal sinus rhythm, normal resting conduction, no resting arrhythmias and normal rest repolarization. 2. The overall quality of the study is good. There is no evidence of abnormal lung activity. Stress and rest SPECT images demonstrate homogeneous tracer distribution throughout the myocardium. Gated SPECT imaging reveals normal myocardial thickening and wall motion. The left ventricular ejection fraction was normal (54%). 3. Low risk study  Renal artery duplex 09/27/2017: Hemodynamically significant stenosis bilaterally. Renal length is within normal limits for both kidneys. There is an incidental 3.9x3.4x3.4 cm proximal and mid abdominal aortic aneurysm. Recommend dedicated abdominal aortic duplex scan to further define AAA.  Echocardiogram 01/25/2019: Left ventricle cavity is normal in size. Severe concentric hypertrophy of the left ventricle. Normal LV systolic function with visual EF 50-55%. Normal global wall motion. Doppler evidence of grade I (impaired) diastolic dysfunction, normal LAP. Calculated EF 47%. Left atrial cavity is mildly dilated. Trileaflet aortic valve with mild calcification of the aortic valve annulus and leaflets.  Mild aortic valve stenosis. Aortic valve mean gradient of 12 mmHg, Vmax of 2.3  m/s. Calculated aortic valve area by continuity equation is 1.3 cm. Trace aortic regurgitation. Mild (Grade I) mitral regurgitation. Mild tricuspid regurgitation. Estimated pulmonary artery systolic pressure is 25 mmHg. No significant change compared to previous study on 09/27/2017.  Abdominal Aortic Duplex  01/01/2019: Moderate dilatation of the abdominal aorta is  noted in the proximal, mid and distal aorta. An abdominal aortic aneurysm measuring 4.5 x 4.52 x 4.52 cm is seen.  Recheck in 6 months for stability of the aneurysm.  Assessment:     ICD-10-CM   1. Resistant hypertension  I10 amLODipine (NORVASC) 5 MG tablet    isosorbide dinitrate (ISORDIL) 30 MG tablet  2. Shortness of breath  R06.02   3. Mild aortic stenosis  I35.0   4. AAA (abdominal aortic aneurysm) without rupture (HCC)  I71.4   5. Hyperlipidemia, group A  E78.00 Bempedoic Acid-Ezetimibe (NEXLIZET) 180-10 MG TABS    Lipid Panel With LDL/HDL Ratio    EKG 01/08/2019: Normal sinus rhythm at 63 bpm with first degree AV block, normal axis, no evidence of ischemia.   Recommendations:   Patient is here for follow-up to discuss abdominal aortic duplex.  She has had slight progression of aortic aneurysm to 4.5 cm.  She will need repeat duplex in 6 months.    I have discussed the etiology for AAA. signs of and symptoms of dissection including severe chest/abdominal pain, radiation to the back.  I have discussed the presentation of rapidly expanding aneurysm and also signs and symptoms of aneurysm rupture with the patient. Indications for going to the ED for urgent evaluation. Importance of continued screening discussed.   BP still elevated, weight loss and lifestyle changes discussed. Add Amlodipine and would like to avoid diltiazem as she is on fairly high dose BB and due to age, worry about AV block.  Also add Isordil TID with  hydralazine.   Change Nexlitol to Nexlizet 600/10 mg daily for hyperlipidemia.  She has not been able to tolerate statins in the past.   Dyspnea is related to chronic deconditioning and obesity, aortic stenosis has remained stable. I will see her back in 4-6 weeks for f/u lipids and hypertension and weight loss.   Adrian Prows, MD, [facility] 02/11/2019, 10:14 PM [facility] [place] Pager: [phone] Office: [phone] If no answer Cell [phone]

## 2019-02-15 DIAGNOSIS — M19011 Primary osteoarthritis, right shoulder: Secondary | ICD-10-CM | POA: Diagnosis not present

## 2019-02-15 DIAGNOSIS — M6281 Muscle weakness (generalized): Secondary | ICD-10-CM | POA: Diagnosis not present

## 2019-02-15 DIAGNOSIS — M25511 Pain in right shoulder: Secondary | ICD-10-CM | POA: Diagnosis not present

## 2019-02-25 DIAGNOSIS — M25511 Pain in right shoulder: Secondary | ICD-10-CM | POA: Diagnosis not present

## 2019-02-25 DIAGNOSIS — M6281 Muscle weakness (generalized): Secondary | ICD-10-CM | POA: Diagnosis not present

## 2019-02-25 DIAGNOSIS — M19011 Primary osteoarthritis, right shoulder: Secondary | ICD-10-CM | POA: Diagnosis not present

## 2019-03-01 ENCOUNTER — Encounter: Payer: Self-pay | Admitting: Physical Medicine and Rehabilitation

## 2019-03-01 DIAGNOSIS — M7062 Trochanteric bursitis, left hip: Secondary | ICD-10-CM | POA: Diagnosis not present

## 2019-03-01 MED ORDER — LIDOCAINE HCL 2 % IJ SOLN
4.0000 mL | INTRAMUSCULAR | Status: AC | PRN
  Administered 2019-03-01: 05:00:00 4 mL

## 2019-03-01 MED ORDER — BUPIVACAINE HCL 0.25 % IJ SOLN
4.0000 mL | INTRAMUSCULAR | Status: AC | PRN
  Administered 2019-03-01: 05:00:00 4 mL via INTRA_ARTICULAR

## 2019-03-01 MED ORDER — TRIAMCINOLONE ACETONIDE 40 MG/ML IJ SUSP
80.0000 mg | INTRAMUSCULAR | Status: AC | PRN
  Administered 2019-03-01: 05:00:00 80 mg via INTRA_ARTICULAR

## 2019-03-01 NOTE — Progress Notes (Signed)
Lisa Mcclure - 85 y.o. female MRN [id number]  Date of birth: 1932/10/16  Office Visit Note: Visit Date: 01/30/2019 PCP: [name], MD Referred by: [name], MD  Subjective: Chief Complaint  Patient presents with  . Lower Back - Pain  . Left Hip - Pain   HPI:  Lisa Mcclure is a 85 y.o. female who comes in today For planned bilateral lower lumbar facet joint blocks at L4-L5-S1 along with greater trochanteric bursa injection.  Please see our prior notes for further details and justification.  Recent evaluation showed that she had pain across the lower back worse with standing and ambulating better at rest without radicular pain.  Pain is an 8 out of 10 not relieved with home exercises and time and rest.  Has some drug intolerances medication has helped to only a mild degree.  Does have pain over the left greater trochanter.  Decided to complete same injection same day do to coronavirus restrictions.  ROS Otherwise per HPI.  Assessment & Plan: Visit Diagnoses:  1. Spondylosis without myelopathy or radiculopathy, lumbar region   2. Greater trochanteric bursitis, left     Plan: No additional findings.   Meds & Orders:  Meds ordered this encounter  Medications  . methylPREDNISolone acetate (DEPO-MEDROL) injection 80 mg    Orders Placed This Encounter  Procedures  . Facet Injection  . Large Joint Inj: L greater trochanter  . XR C-ARM NO REPORT    Follow-up: No follow-ups on file.   Procedures: Large Joint Inj: L greater trochanter on 03/01/2019 5:25 AM Indications: pain and diagnostic evaluation Details: 22 G 3.5 in needle, fluoroscopy-guided lateral approach  Arthrogram: No  Medications: 4 mL lidocaine 2 %; 80 mg triamcinolone acetonide 40 MG/ML; 4 mL bupivacaine 0.25 % Outcome: tolerated well, no immediate complications  There was excellent flow of contrast outlined the greater trochanteric bursa without vascular uptake. Procedure, treatment  alternatives, risks and benefits explained, specific risks discussed. Consent was given by the patient. Immediately prior to procedure a time out was called to verify the correct patient, procedure, equipment, support staff and site/side marked as required. Patient was prepped and draped in the usual sterile fashion.      Lumbar Facet Joint Intra-Articular Injection(s) with Fluoroscopic Guidance  Patient: Lisa Mcclure      Date of Birth: March 13, 1933 MRN: [id number] PCP: [name], MD      Visit Date: 01/30/2019   Universal Protocol:    Date/Time: 01/30/2019  Consent Given By: the patient  Position: PRONE   Additional Comments: Vital signs were monitored before and after the procedure. Patient was prepped and draped in the usual sterile fashion. The correct patient, procedure, and site was verified.   Injection Procedure Details:  Procedure Site One Meds Administered:  Meds ordered this encounter  Medications  . methylPREDNISolone acetate (DEPO-MEDROL) injection 80 mg     Laterality: Bilateral  Location/Site:  L4-L5 L5-S1  Needle size: 22 guage  Needle type: Spinal  Needle Placement: Articular  Findings:  -Comments: Excellent flow of contrast producing a partial arthrogram.  Procedure Details: The fluoroscope beam is vertically oriented in AP, and the inferior recess is visualized beneath the lower pole of the inferior apophyseal process, which represents the target point for needle insertion. When direct visualization is difficult the target point is located at the medial projection of the vertebral pedicle. The region overlying each aforementioned target is locally anesthetized with a 1 to 2 ml. volume of 1% Lidocaine  without Epinephrine.   The spinal needle was inserted into each of the above mentioned facet joints using biplanar fluoroscopic guidance. A 0.25 to 0.5 ml. volume of Isovue-250 was injected and a partial facet joint arthrogram was obtained.  A single spot film was obtained of the resulting arthrogram.    One to 1.25 ml of the steroid/anesthetic solution was then injected into each of the facet joints noted above.   Additional Comments:  The patient tolerated the procedure well Dressing: 2 x 2 sterile gauze and Band-Aid    Post-procedure details: Patient was observed during the procedure. Post-procedure instructions were reviewed.  Patient left the clinic in stable condition.    Clinical History: LUMBAR SPINE - COMPLETE 4+ VIEW  COMPARISON: Chest x-ray dated 03/05/2017.  FINDINGS: There is an age-indeterminate compression fracture of the T12 and T11 vertebral bodies. There is some mild height loss of L2 vertebral body. Degenerative changes are noted throughout the visualized lumbar spine. These are greatest at the lower lumbar segments were there is multilevel facet arthrosis and significant multilevel disc height loss. There appears to be a grade 1-2 anterolisthesis of L5 on S1. Diffuse osteopenia is noted.  IMPRESSION: 1. Age-indeterminate height loss of the T11, T12, and L2 vertebral bodies. Correlation with point tenderness is recommended. If there is clinical concern for an acute fracture this can be further evaluated with cross-sectional imaging. 2. Multilevel degenerative changes throughout the lumbar spine. 3. Grade 1-2 anterolisthesis of L5 on S1. 4. Osteopenia.   Electronically Signed By: Constance Holster M.D. On: 09/14/2018 19:25 ------- EXAM: PELVIS - 1-2 VIEW  COMPARISON:  None.  FINDINGS: There is no acute displaced fracture or dislocation. There is advanced osteoarthritis of the right hip. There is mild-to-moderate osteoarthritis of the left hip. There are degenerative changes of the pubic symphysis. There is osteopenia.  IMPRESSION: 1. No acute displaced fracture or dislocation. 2. Advanced osteoarthritis of the right hip. 3. Mild-to-moderate osteoarthritis of the left hip.  4. Osteopenia which limits detection of nondisplaced fractures.   Electronically Signed   By: Constance Holster M.D.   On: 09/14/2018 19:20     Objective:  VS:  HT:    WT:   BMI:     BP:(!) 164/76  HR:61bpm  TEMP: ( )  RESP:  Physical Exam  Ortho Exam Imaging: No results found.

## 2019-03-01 NOTE — Procedures (Signed)
Lumbar Facet Joint Intra-Articular Injection(s) with Fluoroscopic Guidance  Patient: Lisa Mcclure      Date of Birth: 1932/07/24 MRN: [id number] PCP: [name], MD      Visit Date: 01/30/2019   Universal Protocol:    Date/Time: 01/30/2019  Consent Given By: the patient  Position: PRONE   Additional Comments: Vital signs were monitored before and after the procedure. Patient was prepped and draped in the usual sterile fashion. The correct patient, procedure, and site was verified.   Injection Procedure Details:  Procedure Site One Meds Administered:  Meds ordered this encounter  Medications  . methylPREDNISolone acetate (DEPO-MEDROL) injection 80 mg     Laterality: Bilateral  Location/Site:  L4-L5 L5-S1  Needle size: 22 guage  Needle type: Spinal  Needle Placement: Articular  Findings:  -Comments: Excellent flow of contrast producing a partial arthrogram.  Procedure Details: The fluoroscope beam is vertically oriented in AP, and the inferior recess is visualized beneath the lower pole of the inferior apophyseal process, which represents the target point for needle insertion. When direct visualization is difficult the target point is located at the medial projection of the vertebral pedicle. The region overlying each aforementioned target is locally anesthetized with a 1 to 2 ml. volume of 1% Lidocaine without Epinephrine.   The spinal needle was inserted into each of the above mentioned facet joints using biplanar fluoroscopic guidance. A 0.25 to 0.5 ml. volume of Isovue-250 was injected and a partial facet joint arthrogram was obtained. A single spot film was obtained of the resulting arthrogram.    One to 1.25 ml of the steroid/anesthetic solution was then injected into each of the facet joints noted above.   Additional Comments:  The patient tolerated the procedure well Dressing: 2 x 2 sterile gauze and Band-Aid    Post-procedure details: Patient  was observed during the procedure. Post-procedure instructions were reviewed.  Patient left the clinic in stable condition.

## 2019-03-01 NOTE — Progress Notes (Signed)
Lisa Mcclure - 85 y.o. female MRN [id number]  Date of birth: 11-Dec-1932  Office Visit Note: Visit Date: 01/15/2019 PCP: [name], MD Referred by: [name], MD  Subjective: Chief Complaint  Patient presents with  . Lower Back - Pain   HPI: Lisa Mcclure is a 85 y.o. female who comes in today He has a new patient evaluation requested by Dr. Janie Morning for chronic worsening severe low back pain with some pain in the left hip.  She reports average pain of 9 out of 10 which is intermittent sharp and aching pain particular across the lower back worse with standing better at rest.  She does have a history of restless leg syndrome.  She also gets an incredible amount of pain laying on the left side and points to her greater trochanteric area.  She denies any groin pain bilaterally.  She denies any tingling numbness or paresthesias.  But she feels weak at times but no focal weakness.  She has been using heat and ice and medication.  She does take 120 tablets of hydrocodone 7.5/month through Dr. Philip Aspen.  She has a history of multiple orthopedic complaints and some fractures.  She has had x-ray of the lumbar spine which is reviewed below and reviewed with the patient.  She has some osteoporotic features as well as listhesis of L5 on S1 and multilevel facet arthropathy.  She has not had CT scan or MRI of the lumbar spine.  There was an older MRI of the cervical spine.  She is not any lumbar spine surgery.  She does not have any real radicular pain down the legs.  She has had no recent trauma or falls.  Case is complicated by opioid use, obesity as well as chronic health conditions including heart failure.  Review of Systems  Constitutional: Negative for chills, fever, malaise/fatigue and weight loss.  HENT: Negative for hearing loss and sinus pain.   Eyes: Negative for blurred vision, double vision and photophobia.  Respiratory: Positive for shortness of breath. Negative for  cough.   Cardiovascular: Negative for chest pain, palpitations and leg swelling.  Gastrointestinal: Negative for abdominal pain, nausea and vomiting.  Genitourinary: Negative for flank pain.  Musculoskeletal: Positive for back pain and joint pain. Negative for myalgias.  Skin: Negative for itching and rash.  Neurological: Negative for tremors, focal weakness and weakness.  Endo/Heme/Allergies: Negative.   Psychiatric/Behavioral: Negative for depression.  All other systems reviewed and are negative.  Otherwise per HPI.  Assessment & Plan: Visit Diagnoses: No diagnosis found.  Plan: Findings:  Chronic worsening mostly axial low back pain not relieved with current usage of hydrocodone and she has had physical therapy last year and continues to try to stay active.  She has multiple medical problems at the age of 6.  X-rays are clearly revealing of arthritis along with listhesis.  There may be some level of stenosis that we do not know about but her symptoms seem to be more consistent with facet mediated low back pain.  Exam is worse with extension and facet loading.  She clearly has what appears to be greater trochanteric bursitis on the left.  Obviously this could be more of a radicular pattern just into the hip area but I think it is consistent with bursitis.  At this point I think the best approach is to get her in for facet joint blocks diagnostically and hopefully therapeutically along with a greater trochanteric bursa injection.  She is essentially high  risk for coronavirus problem so I think we should do all that at one time and she is amenable to that.  If she gets some relief with the facet injections at least more than 50% would look at a double block paradigm and possibly radiofrequency ablation.  If she does not get much relief then the next step would be MRI or CT scan of the lumbar spine.    Meds & Orders: No orders of the defined types were placed in this encounter.  No orders of the  defined types were placed in this encounter.   Follow-up: No follow-ups on file.   Procedures: No procedures performed  No notes on file   Clinical History: LUMBAR SPINE - COMPLETE 4+ VIEW  COMPARISON: Chest x-ray dated 03/05/2017.  FINDINGS: There is an age-indeterminate compression fracture of the T12 and T11 vertebral bodies. There is some mild height loss of L2 vertebral body. Degenerative changes are noted throughout the visualized lumbar spine. These are greatest at the lower lumbar segments were there is multilevel facet arthrosis and significant multilevel disc height loss. There appears to be a grade 1-2 anterolisthesis of L5 on S1. Diffuse osteopenia is noted.  IMPRESSION: 1. Age-indeterminate height loss of the T11, T12, and L2 vertebral bodies. Correlation with point tenderness is recommended. If there is clinical concern for an acute fracture this can be further evaluated with cross-sectional imaging. 2. Multilevel degenerative changes throughout the lumbar spine. 3. Grade 1-2 anterolisthesis of L5 on S1. 4. Osteopenia.   Electronically Signed By: Constance Holster M.D. On: 09/14/2018 19:25 ------- EXAM: PELVIS - 1-2 VIEW  COMPARISON:  None.  FINDINGS: There is no acute displaced fracture or dislocation. There is advanced osteoarthritis of the right hip. There is mild-to-moderate osteoarthritis of the left hip. There are degenerative changes of the pubic symphysis. There is osteopenia.  IMPRESSION: 1. No acute displaced fracture or dislocation. 2. Advanced osteoarthritis of the right hip. 3. Mild-to-moderate osteoarthritis of the left hip. 4. Osteopenia which limits detection of nondisplaced fractures.   Electronically Signed   By: Constance Holster M.D.   On: 09/14/2018 19:20   She reports that she quit smoking about 34 years ago. Her smoking use included cigarettes. She has a 2.00 pack-year smoking history. She has never used  smokeless tobacco. No results for input(s): HGBA1C, LABURIC in the last 8760 hours.  Objective:  VS:  HT:    WT:   BMI:     BP:(!) 179/96  HR:61bpm  TEMP: ( )  RESP:  Physical Exam Vitals signs and nursing note reviewed.  Constitutional:      General: She is not in acute distress.    Appearance: Normal appearance. She is well-developed. She is obese.  HENT:     Head: Normocephalic and atraumatic.     Nose: Nose normal.     Mouth/Throat:     Mouth: Mucous membranes are moist.     Pharynx: Oropharynx is clear.  Eyes:     Conjunctiva/sclera: Conjunctivae normal.     Pupils: Pupils are equal, round, and reactive to light.  Neck:     Musculoskeletal: Normal range of motion and neck supple.  Cardiovascular:     Rate and Rhythm: Regular rhythm.  Pulmonary:     Effort: Pulmonary effort is normal. No respiratory distress.  Abdominal:     General: There is no distension.     Palpations: Abdomen is soft.     Tenderness: There is no guarding.  Musculoskeletal:  Right lower leg: Edema present.     Left lower leg: Edema present.     Comments: Patient is very slow to go from sit to stand in full extension she has concordant low back pain with extension and facet loading.  She has no tender points noted.  She does have exquisite pain over the left greater trochanter not the right.  No pain with hip rotation on either side.  She has good distal strength without clonus.  Skin:    General: Skin is warm and dry.     Findings: No erythema or rash.  Neurological:     General: No focal deficit present.     Mental Status: She is alert and oriented to person, place, and time.     Cranial Nerves: No cranial nerve deficit.     Motor: No weakness or abnormal muscle tone.     Coordination: Coordination normal.     Gait: Gait normal.  Psychiatric:        Mood and Affect: Mood normal.        Behavior: Behavior normal.        Thought Content: Thought content normal.     Ortho Exam Imaging:  No results found.  Past Medical/Family/Surgical/Social History: Medications & Allergies reviewed per EMR, new medications updated. Patient Active Problem List   Diagnosis Date Noted  . Dehydration with hyponatremia 09/13/2018  . Bradycardia 09/13/2018  . Glaucoma 09/13/2018  . OA (osteoarthritis) of shoulder 05/16/2018  . Aortic stenosis, mild 01/15/2016  . Bilateral carotid bruits 05/11/2015  . Hyponatremia 04/19/2015  . Chronic anemia 04/19/2015  . Panic attack 04/19/2015  . Constipation 04/19/2015  . Hyperkalemia 04/17/2015  . Influenza A 04/16/2015  . Proximal humerus fracture   . Left patella fracture   . Benign essential HTN   . Esophageal reflux   . Chronic diastolic heart failure ([place])   . Hypothyroidism   . Lower GI bleed 04/08/2015  . Hematochezia   . Rectal bleeding 04/03/2015  . Patellar fracture 03/06/2015  . Left humeral fracture 11/04/2013  . Rib fracture 11/04/2013  . Hypothyroid 11/04/2013  . Hypertension 11/04/2013  . Right shoulder pain 11/04/2013  . OSA (obstructive sleep apnea) 11/13/2008  . BOOP (bronchiolitis obliterans with organizing pneumonia) ([place]) 08/25/2008   Past Medical History:  Diagnosis Date  . Anemia    h/o hemorrhoidal bleeding and blood transfusion  . Cardiomegaly   . Chronic diastolic CHF (congestive heart failure) ([place])   . Depression   . Diverticulosis   . DOE (dyspnea on exertion)   . Esophageal reflux   . GERD (gastroesophageal reflux disease)   . Hypothyroidism   . Insomnia   . LBP (low back pain)   . OAB (overactive bladder)   . Obesity   . OSA (obstructive sleep apnea)   . Osteoarthritis   . Rheumatoid arthritis(714.0)   . Shoulder pain, bilateral   . Unspecified essential hypertension   . Venous insufficiency    Family History  Problem Relation Age of Onset  . COPD Father   . Lung cancer Mother   . Heart attack Maternal Grandmother 80  . Breast cancer Maternal Aunt   . Lung cancer Son    Past Surgical  History:  Procedure Laterality Date  . APPENDECTOMY  1953  . bladder abduction-1996  1996  . breast biopsy Right 1980  . [place]  . COSMETIC SURGERY  1996  . CYSTOCELE REPAIR    .  FLEXIBLE SIGMOIDOSCOPY N/A 04/10/2015   Procedure: FLEXIBLE SIGMOIDOSCOPY;  Surgeon: Arta Silence, MD;  Location[facility] ENDOSCOPY;  Service: Endoscopy;  Laterality: N/A;  . knee arthroscopy Right 1996, 2010  . KNEE ARTHROSCOPY W/ AUTOGENOUS CARTILAGE IMPLANTATION (ACI) PROCEDURE Left 1994, 1995  . SHOULDER SURGERY  1990  . TOTAL ABDOMINAL HYSTERECTOMY  1972  . VESICOVAGINAL FISTULA CLOSURE W/ TAH    . WRIST SURGERY  1967   Social History   Occupational History  . Occupation: Retired  Tobacco Use  . Smoking status: Former Smoker    Packs/day: 0.20    Years: 10.00    Pack years: 2.00    Types: Cigarettes    Quit date: 04/11/1984    Years since quitting: 34.9  . Smokeless tobacco: Never Used  Substance and Sexual Activity  . Alcohol use: Yes    Alcohol/week: 0.0 standard drinks    Comment: cocktail daily  . Drug use: No  . Sexual activity: Not on file

## 2019-03-14 DIAGNOSIS — E7800 Pure hypercholesterolemia, unspecified: Secondary | ICD-10-CM | POA: Diagnosis not present

## 2019-03-14 DIAGNOSIS — H353122 Nonexudative age-related macular degeneration, left eye, intermediate dry stage: Secondary | ICD-10-CM | POA: Diagnosis not present

## 2019-03-14 DIAGNOSIS — H353114 Nonexudative age-related macular degeneration, right eye, advanced atrophic with subfoveal involvement: Secondary | ICD-10-CM | POA: Diagnosis not present

## 2019-03-15 LAB — LIPID PANEL WITH LDL/HDL RATIO
Cholesterol, Total: 126 mg/dL (ref 100–199)
HDL: 49 mg/dL
LDL Chol Calc (NIH): 61 mg/dL (ref 0–99)
LDL/HDL Ratio: 1.2 ratio (ref 0.0–3.2)
Triglycerides: 82 mg/dL (ref 0–149)
VLDL Cholesterol Cal: 16 mg/dL (ref 5–40)

## 2019-03-18 ENCOUNTER — Ambulatory Visit (INDEPENDENT_AMBULATORY_CARE_PROVIDER_SITE_OTHER): Payer: Medicare Other | Admitting: Cardiology

## 2019-03-18 ENCOUNTER — Other Ambulatory Visit: Payer: Self-pay

## 2019-03-18 ENCOUNTER — Encounter: Payer: Self-pay | Admitting: Cardiology

## 2019-03-18 VITALS — BP 130/66 | HR 62 | Temp 94.7°F | Ht 63.5 in | Wt 209.3 lb

## 2019-03-18 DIAGNOSIS — I10 Essential (primary) hypertension: Secondary | ICD-10-CM | POA: Diagnosis not present

## 2019-03-18 DIAGNOSIS — E7800 Pure hypercholesterolemia, unspecified: Secondary | ICD-10-CM | POA: Diagnosis not present

## 2019-03-18 DIAGNOSIS — I2510 Atherosclerotic heart disease of native coronary artery without angina pectoris: Secondary | ICD-10-CM

## 2019-03-18 NOTE — Patient Instructions (Signed)
Ref Range & Units 03/13/2019  Cholesterol, Total 100 - 199 mg/dL 126   Triglycerides 0 - 149 mg/dL 82   HDL >39 mg/dL 49   VLDL Cholesterol Cal 5 - 40 mg/dL 16   LDL Chol Calc (NIH) 0 - 99 mg/dL 61   LDL/HDL Ratio 0.0 - 3.2 ratio 1.2

## 2019-03-18 NOTE — Progress Notes (Signed)
Primary Physician:  [name], MD   [name], female    DOB: Jan 28, 1933, 85 y.o.   MRN: [id number]  Subjective:    Chief Complaint  Patient presents with  . Hypertension  . Hyperlipidemia  . Follow-up    5wk    HPI: Lisa Mcclure  is a 85 y.o. female  with past medical history of obesity, chronic diastolic CHF, hypertensive heart disease, HTN, anemia, hemorrhoids, depression, diverticulosis, esophageal reflux, OAB, OSA intolerant to CPAP on nocturnal 02, rheumatoid arthritis, and venous insufficiency. CT scan of the chest 03/2016 without definitive ILD. LM and 2 vessel CAD, mild calcification on mitral and aortic valve. Also has  moderate sized AAA.   She has been intolerant to several statins in the past, I had started her on Nexlezet and now presents for f/u. She is tolerating this well. Also added Amlodipine 5 mg and ISDN and Hydralazine. She is tolerating this well. Daughter is present at bedside. Except for chronic dyspnea, no other specific complaints.   Past Medical History:  Diagnosis Date  . Anemia    h/o hemorrhoidal bleeding and blood transfusion  . Cardiomegaly   . Chronic diastolic CHF (congestive heart failure) ([place])   . Depression   . Diverticulosis   . DOE (dyspnea on exertion)   . Esophageal reflux   . GERD (gastroesophageal reflux disease)   . Hypothyroidism   . Insomnia   . LBP (low back pain)   . OAB (overactive bladder)   . Obesity   . OSA (obstructive sleep apnea)   . Osteoarthritis   . Rheumatoid arthritis(714.0)   . Shoulder pain, bilateral   . Unspecified essential hypertension   . Venous insufficiency     Past Surgical History:  Procedure Laterality Date  . APPENDECTOMY  1953  . bladder abduction-1996  1996  . breast biopsy Right 1980  . [place]  . COSMETIC SURGERY  1996  . CYSTOCELE REPAIR    . FLEXIBLE SIGMOIDOSCOPY N/A 04/10/2015   Procedure: FLEXIBLE SIGMOIDOSCOPY;   Surgeon: Arta Silence, MD;  Location: [facility] ENDOSCOPY;  Service: Endoscopy;  Laterality: N/A;  . knee arthroscopy Right 1996, 2010  . KNEE ARTHROSCOPY W/ AUTOGENOUS CARTILAGE IMPLANTATION (ACI) PROCEDURE Left 1994, 1995  . SHOULDER SURGERY  1990  . TOTAL ABDOMINAL HYSTERECTOMY  1972  . VESICOVAGINAL FISTULA CLOSURE W/ TAH    . WRIST SURGERY  1967    Social History   Socioeconomic History  . Marital status: Widowed    Spouse name: Not on file  . Number of children: 3  . Years of education: College  . Highest education level: Not on file  Occupational History  . Occupation: Retired  Scientific laboratory technician  . Financial resource strain: Not on file  . Food insecurity    Worry: Not on file    Inability: Not on file  . Transportation needs    Medical: Not on file    Non-medical: Not on file  Tobacco Use  . Smoking status: Former Smoker    Packs/day: 0.20    Years: 10.00    Pack years: 2.00    Types: Cigarettes    Quit date: 04/11/1984    Years since quitting: 34.9  . Smokeless tobacco: Never Used  Substance and Sexual Activity  . Alcohol use: Yes    Alcohol/week: 0.0 standard drinks    Comment: cocktail once a week  . Drug use: No  . Sexual  activity: Not on file  Lifestyle  . Physical activity    Days per week: Not on file    Minutes per session: Not on file  . Stress: Not on file  Relationships  . Social Herbalist on phone: Not on file    Gets together: Not on file    Attends religious service: Not on file    Active member of club or organization: Not on file    Attends meetings of clubs or organizations: Not on file    Relationship status: Not on file  . Intimate partner violence    Fear of current or ex partner: Not on file    Emotionally abused: Not on file    Physically abused: Not on file    Forced sexual activity: Not on file  Other Topics Concern  . 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  Eyes: Negative for visual disturbance.  Cardiovascular: Positive for dyspnea on exertion. Negative for chest pain, claudication, leg swelling, orthopnea, palpitations and syncope.  Respiratory: Negative for hemoptysis and wheezing.   Endocrine: Negative for cold intolerance and heat intolerance.  Hematologic/Lymphatic: Does not bruise/bleed easily.  Skin: Negative for nail changes.  Musculoskeletal: Positive for back pain and joint pain (bilateral knees). Negative for muscle weakness and myalgias.  Gastrointestinal: Negative for abdominal pain, change in bowel habit, nausea and vomiting.  Neurological: Negative for difficulty with concentration, dizziness, focal weakness and headaches.  Psychiatric/Behavioral: Negative for altered mental status and suicidal ideas.  All other systems reviewed and are negative.  Objective:   Vitals with BMI 03/18/2019 02/11/2019 01/30/2019  Height 5' 3.5" 5\' 3"  -  Weight 209 lbs 5 oz 208 lbs 2 oz -  BMI [id number] -  Systolic [id number]  Diastolic 66 72 76  Pulse 62 67 61    Blood pressure 130/66, pulse 62, temperature (!) 94.7 F (34.8 C), height 5' 3.5" (1.613 m), weight 209 lb 4.8 oz (94.9 kg), SpO2 96 %.   Physical Exam  Constitutional: She is oriented to person, place, and time. Vital signs are normal. She appears well-developed and well-nourished.  HENT:  Head: Normocephalic and atraumatic.  Neck: Normal range of motion.  Cardiovascular: Normal rate, regular rhythm and intact distal pulses.  Murmur heard.  Harsh early systolic murmur is present with a grade of 3/6 at the upper right sternal border radiating to the apex. Pulses:      Femoral pulses are 1+ on the right side and 1+ on the left  side.      Popliteal pulses are 1+ on the right side and 1+ on the left side.       Dorsalis pedis pulses are 2+ on the right side and 2+ on the left side.       Posterior tibial pulses are 2+ on the right side and 2+ on the left side.  Pulmonary/Chest: Effort normal and breath sounds normal. No accessory muscle usage. No respiratory distress.  Abdominal: Soft. Bowel sounds are normal.  Musculoskeletal: Normal range of motion.  Neurological: She is alert and oriented to person, place, and time.  Skin: Skin is warm and dry.  Vitals reviewed.  Radiology: No results found.  Laboratory examination:    CMP Latest Ref Rng & Units 09/17/2018 09/16/2018 09/15/2018  Glucose 70 - 99 mg/dL 84 88 131(H)  BUN 8 - 23 mg/dL 12 14 14   Creatinine 0.44 - 1.00 mg/dL 1.01(H) 0.78 0.81  Sodium 135 - 145 mmol/L 135 133(L) 129(L)  Potassium 3.5 - 5.1 mmol/L 4.4 4.4 4.6  Chloride 98 - 111 mmol/L 101 102 98  CO2 22 - 32 mmol/L 25 24 20(L)  Calcium 8.9 - 10.3 mg/dL 9.0 8.8(L) 8.6(L)  Total Protein 6.5 - 8.1 g/dL - - -  Total Bilirubin 0.3 - 1.2 mg/dL - - -  Alkaline Phos 38 - 126 U/L - - -  AST 15 - 41 U/L - - -  ALT 0 - 44 U/L - - -   CBC Latest Ref Rng & Units 09/14/2018 09/13/2018 01/28/2016  WBC 4.0 - 10.5 K/uL 8.3 7.3 9.5  Hemoglobin 12.0 - 15.0 g/dL 12.2 11.7(L) 12.1  Hematocrit 36.0 - 46.0 % 34.4(L) 32.7(L) 37.0  Platelets 150 - 400 K/uL 297 272 335   Lipid Panel     Component Value Date/Time   CHOL 126 03/14/2019 1121   TRIG 82 03/14/2019 1121   HDL 49 03/14/2019 1121   CHOLHDL 5.1 CALC 12/19/2006 0916   VLDL 26 12/19/2006 0916   LDLCALC 61 03/14/2019 1121   LDLDIRECT 159.2 12/19/2006 0916   HEMOGLOBIN A1C No results found for: HGBA1C, MPG TSH No results for input(s): TSH in the last 8760 hours.  PRN Meds:. Medications Discontinued During This Encounter  Medication Reason  . Multiple Vitamin (MULTI-DAY VITAMINS PO) Patient Preference   Current Meds  Medication Sig  . amLODipine  (NORVASC) 5 MG tablet Take 1 tablet (5 mg total) by mouth daily.  Marland Kitchen aspirin EC 81 MG tablet Take 81 mg by mouth daily.  . Bempedoic Acid-Ezetimibe (NEXLIZET) 180-10 MG TABS Take 1 capsule by mouth daily.  . busPIRone (BUSPAR) 7.5 MG tablet Take 1 tablet (7.5 mg total) by mouth daily as needed (for anxiety).  . carvedilol (COREG) 25 MG tablet Take 25 mg by mouth 2 (two) times daily.  . Cholecalciferol (VITAMIN D3) 125 MCG (5000 UT) CAPS Take 5,000 Units by mouth daily with breakfast.  . diclofenac sodium (VOLTAREN) 1 % GEL Apply 2.25 g topically 3 (three) times daily as needed.  . fluticasone (FLONASE) 50 MCG/ACT nasal spray Place 2 sprays into both nostrils daily as needed for allergies or rhinitis.   . furosemide (LASIX) 20 MG tablet Take 1 tablet (20 mg total) by mouth daily.  . hydrALAZINE (APRESOLINE) 25 MG tablet TAKE 1 TABLET 3 TIMES A DAY MAKE APPT WITH DR Meda Coffee (Patient taking differently: Take 25 mg by mouth 2 (two) times a day. )  . HYDROcodone-acetaminophen (NORCO) 7.5-325 MG tablet Take 1 tablet by mouth every 6 (six) hours as needed for moderate pain.  . hydrocortisone (ANUSOL-HC) 2.5 % rectal cream Apply 1 application topically 4 (four) times daily as needed for hemorrhoids.  . isosorbide dinitrate (ISORDIL) 30 MG tablet Take 1 tablet (30 mg total) by mouth 3 (three) times daily.  Marland Kitchen latanoprost (XALATAN) 0.005 % ophthalmic solution Place 1 drop into both eyes at bedtime.  Marland Kitchen levothyroxine (SYNTHROID, LEVOTHROID) 100 MCG tablet Take 100 mcg by mouth daily before breakfast.  . losartan (COZAAR) 100 MG tablet Take 100 mg by mouth 2 (two) times daily.   . modafinil (PROVIGIL) 100  MG tablet Take 100 mg by mouth daily.  . Multiple Vitamins-Minerals (ICAPS AREDS 2 PO) Take by mouth 2 (two) times daily.  Marland Kitchen omeprazole (PRILOSEC) 20 MG capsule Take 20 mg by mouth 2 (two) times daily before a meal.   . rOPINIRole (REQUIP) 1 MG tablet Take 1 mg by mouth See admin instructions. Take 1 mg by mouth  once a day between 3 PM-4 PM and an additional 1 mg in the evening, if no relief   Cardiac Studies:   Lexiscan myoview stress test 12/08/2017: 1. Lexiscan stress test was performed. Exercise capacity was not assessed. Stress symptoms included abdominal pain. Blood pressure was normal. The resting and stress electrocardiogram demonstrated normal sinus rhythm, normal resting conduction, no resting arrhythmias and normal rest repolarization. 2. The overall quality of the study is good. There is no evidence of abnormal lung activity. Stress and rest SPECT images demonstrate homogeneous tracer distribution throughout the myocardium. Gated SPECT imaging reveals normal myocardial thickening and wall motion. The left ventricular ejection fraction was normal (54%). 3. Low risk study  Renal artery duplex 09/27/2017: Hemodynamically significant stenosis bilaterally. Renal length is within normal limits for both kidneys. There is an incidental 3.9x3.4x3.4 cm proximal and mid abdominal aortic aneurysm. Recommend dedicated abdominal aortic duplex scan to further define AAA.  Echocardiogram 01/25/2019: Left ventricle cavity is normal in size. Severe concentric hypertrophy of the left ventricle. Normal LV systolic function with visual EF 50-55%. Normal global wall motion. Doppler evidence of grade I (impaired) diastolic dysfunction, normal LAP. Calculated EF 47%. Left atrial cavity is mildly dilated. Trileaflet aortic valve with mild calcification of the aortic valve annulus and leaflets.  Mild aortic valve stenosis. Aortic valve mean gradient of 12 mmHg, Vmax of 2.3  m/s. Calculated aortic valve area by continuity equation is 1.3 cm. Trace aortic regurgitation. Mild (Grade I) mitral regurgitation. Mild tricuspid regurgitation. Estimated pulmonary artery systolic pressure is 25 mmHg. No significant change compared to previous study on 09/27/2017.  Abdominal Aortic Duplex  01/01/2019: Moderate dilatation of  the abdominal aorta is noted in the proximal, mid and distal aorta. An abdominal aortic aneurysm measuring 4.5 x 4.52 x 4.52 cm is seen.  Recheck in 6 months for stability of the aneurysm.  Assessment:     ICD-10-CM   1. Resistant hypertension  I10   2. Hyperlipidemia, group A  E78.00     EKG 01/08/2019: Normal sinus rhythm at 63 bpm with first degree AV block, normal axis, no evidence of ischemia.   Recommendations:   Lisa Mcclure  is a 85 y.o.  female  with past medical history of obesity, chronic diastolic CHF, hypertensive heart disease, HTN, anemia, hemorrhoids, depression, diverticulosis, esophageal reflux, OAB, OSA intolerant to CPAP on nocturnal 02, rheumatoid arthritis, and venous insufficiency. CT scan of the chest 03/2016 without definitive ILD. LM and 2 vessel CAD, mild calcification on mitral and aortic valve. Also has  moderate sized AAA.    She has been intolerant to several statins in the past, I had started her on Nexlitol and now presents for f/u for hypertension and hyperlipidemia.   I REVIEWED THE LABS WITH HER AND HER DAUGHTER, LIPIDS UNDER EXCELLENT CONTROL ALONG WITH EXCELLENT CONTROL OF BLOOD PRESSURE.  POSITIVE REINFORCEMENT GIVEN WITH REGARD TO WEIGHT LOSS, I WILL SEE HER BACK IN 6 MONTHS OR SOONER IF PROBLEMS.  This is a 25 minute office visit encounter to discuss all the medications, side effects of the medications, lifestyle modification, discussion regarding abdominal aortic  aneurysm.  Although patient is 85 years of age, appears to be in excellent health overall.  Adrian Prows, MD, [facility] 03/18/2019, 3:19 PM [facility] [place] Pager: [phone] Office: [phone] If no answer Cell [phone]

## 2019-03-26 DIAGNOSIS — Z20828 Contact with and (suspected) exposure to other viral communicable diseases: Secondary | ICD-10-CM | POA: Diagnosis not present

## 2019-03-30 ENCOUNTER — Other Ambulatory Visit: Payer: Self-pay | Admitting: Cardiology

## 2019-03-30 DIAGNOSIS — I10 Essential (primary) hypertension: Secondary | ICD-10-CM

## 2019-05-06 ENCOUNTER — Other Ambulatory Visit: Payer: Self-pay | Admitting: Cardiology

## 2019-05-06 DIAGNOSIS — I10 Essential (primary) hypertension: Secondary | ICD-10-CM

## 2019-05-07 ENCOUNTER — Telehealth: Payer: Self-pay

## 2019-05-07 NOTE — Telephone Encounter (Signed)
Increase Hydralazine to TID. Increase amlodipine to 10 mg daily. Would recommend that she call us tomorrow and let us know if improved or not. If not improved, will need office visit.

## 2019-05-07 NOTE — Telephone Encounter (Signed)
Called pt to inform her about the message above. Pt understood.

## 2019-05-07 NOTE — Telephone Encounter (Signed)
Pt called to inform us that she has had high bp for about week. It has been 190/99. Pt mention fatigue with ear ringing. Denies any headaches nor dizzy. Pt mention she is taking 4 different bp medication. And would like to know why is her bp so high. Please advice thank you.

## 2019-05-08 ENCOUNTER — Other Ambulatory Visit: Payer: Self-pay

## 2019-05-08 DIAGNOSIS — I1A0 Resistant hypertension: Secondary | ICD-10-CM

## 2019-05-08 DIAGNOSIS — I10 Essential (primary) hypertension: Secondary | ICD-10-CM

## 2019-05-08 DIAGNOSIS — I5032 Chronic diastolic (congestive) heart failure: Secondary | ICD-10-CM

## 2019-05-08 MED ORDER — LOSARTAN POTASSIUM 100 MG PO TABS: 100.0000 mg | ORAL_TABLET | Freq: Two times a day (BID) | ORAL | 1 refills | Status: DC

## 2019-05-08 MED ORDER — AMLODIPINE BESYLATE 10 MG PO TABS: 10.0000 mg | ORAL_TABLET | Freq: Every day | ORAL | 1 refills | Status: DC

## 2019-05-08 MED ORDER — HYDRALAZINE HCL 25 MG PO TABS: ORAL_TABLET | ORAL | 1 refills | Status: DC

## 2019-05-09 ENCOUNTER — Ambulatory Visit: Payer: Medicare Other

## 2019-05-18 ENCOUNTER — Ambulatory Visit: Payer: Medicare Other | Attending: Internal Medicine

## 2019-05-18 DIAGNOSIS — Z23 Encounter for immunization: Secondary | ICD-10-CM

## 2019-05-18 NOTE — Progress Notes (Signed)
   Covid-19 Vaccination Clinic  Name:  Lisa Mcclure    MRN: [id number] DOB: [birth year]  05/18/2019  [name] was observed post Covid-19 immunization for 15 minutes without incidence. She was provided with Vaccine Information Sheet and instruction to access the V-Safe system.   [name] was instructed to call 911 with any severe reactions post vaccine: Marland Kitchen Difficulty breathing  . Swelling of your face and throat  . A fast heartbeat  . A bad rash all over your body  . Dizziness and weakness    Immunizations Administered    Name Date Dose VIS Date Route   Pfizer COVID-19 Vaccine 05/18/2019  9:04 AM 0.3 mL 03/22/2019 Intramuscular   Manufacturer: [place]   Lot: [id number]   [place]: [id number]

## 2019-05-28 DIAGNOSIS — I10 Essential (primary) hypertension: Secondary | ICD-10-CM | POA: Diagnosis not present

## 2019-05-28 DIAGNOSIS — Z79899 Other long term (current) drug therapy: Secondary | ICD-10-CM | POA: Diagnosis not present

## 2019-06-07 DIAGNOSIS — Z96651 Presence of right artificial knee joint: Secondary | ICD-10-CM | POA: Diagnosis not present

## 2019-06-07 DIAGNOSIS — Z471 Aftercare following joint replacement surgery: Secondary | ICD-10-CM | POA: Diagnosis not present

## 2019-06-12 ENCOUNTER — Ambulatory Visit: Payer: Medicare Other | Attending: Internal Medicine

## 2019-06-12 DIAGNOSIS — Z23 Encounter for immunization: Secondary | ICD-10-CM | POA: Insufficient documentation

## 2019-06-12 NOTE — Progress Notes (Signed)
   Covid-19 Vaccination Clinic  Name:  AYLEIGH CUADRA    MRN: [id number] DOB: Nov 01, 1932  06/12/2019  Ms. Mazurowski was observed post Covid-19 immunization for 15 minutes without incident. She was provided with Vaccine Information Sheet and instruction to access the V-Safe system.   Ms. Nissim was instructed to call 911 with any severe reactions post vaccine: Marland Kitchen Difficulty breathing  . Swelling of face and throat  . A fast heartbeat  . A bad rash all over body  . Dizziness and weakness   Immunizations Administered    Name Date Dose VIS Date Route   Pfizer COVID-19 Vaccine 06/12/2019 11:57 AM 0.3 mL 03/22/2019 Intramuscular   Manufacturer: [place]   Lot: [id number]   [place]: [id number]

## 2019-06-17 DIAGNOSIS — H353122 Nonexudative age-related macular degeneration, left eye, intermediate dry stage: Secondary | ICD-10-CM | POA: Diagnosis not present

## 2019-06-17 DIAGNOSIS — H43813 Vitreous degeneration, bilateral: Secondary | ICD-10-CM | POA: Diagnosis not present

## 2019-06-17 DIAGNOSIS — H353114 Nonexudative age-related macular degeneration, right eye, advanced atrophic with subfoveal involvement: Secondary | ICD-10-CM | POA: Diagnosis not present

## 2019-06-17 DIAGNOSIS — H43393 Other vitreous opacities, bilateral: Secondary | ICD-10-CM | POA: Diagnosis not present

## 2019-06-21 DIAGNOSIS — E038 Other specified hypothyroidism: Secondary | ICD-10-CM | POA: Diagnosis not present

## 2019-06-21 DIAGNOSIS — E7849 Other hyperlipidemia: Secondary | ICD-10-CM | POA: Diagnosis not present

## 2019-06-26 NOTE — Telephone Encounter (Signed)
error 

## 2019-06-28 DIAGNOSIS — Z1339 Encounter for screening examination for other mental health and behavioral disorders: Secondary | ICD-10-CM | POA: Diagnosis not present

## 2019-06-28 DIAGNOSIS — R32 Unspecified urinary incontinence: Secondary | ICD-10-CM | POA: Diagnosis not present

## 2019-06-28 DIAGNOSIS — E785 Hyperlipidemia, unspecified: Secondary | ICD-10-CM | POA: Diagnosis not present

## 2019-06-28 DIAGNOSIS — E039 Hypothyroidism, unspecified: Secondary | ICD-10-CM | POA: Diagnosis not present

## 2019-06-28 DIAGNOSIS — I5032 Chronic diastolic (congestive) heart failure: Secondary | ICD-10-CM | POA: Diagnosis not present

## 2019-06-28 DIAGNOSIS — I350 Nonrheumatic aortic (valve) stenosis: Secondary | ICD-10-CM | POA: Diagnosis not present

## 2019-06-28 DIAGNOSIS — G2581 Restless legs syndrome: Secondary | ICD-10-CM | POA: Diagnosis not present

## 2019-06-28 DIAGNOSIS — Z0000 Encounter for general adult medical examination without abnormal findings: Secondary | ICD-10-CM | POA: Diagnosis not present

## 2019-06-28 DIAGNOSIS — G4733 Obstructive sleep apnea (adult) (pediatric): Secondary | ICD-10-CM | POA: Diagnosis not present

## 2019-06-28 DIAGNOSIS — E669 Obesity, unspecified: Secondary | ICD-10-CM | POA: Diagnosis not present

## 2019-06-28 DIAGNOSIS — I110 Hypertensive heart disease with heart failure: Secondary | ICD-10-CM | POA: Diagnosis not present

## 2019-06-28 DIAGNOSIS — Z1331 Encounter for screening for depression: Secondary | ICD-10-CM | POA: Diagnosis not present

## 2019-06-28 DIAGNOSIS — I872 Venous insufficiency (chronic) (peripheral): Secondary | ICD-10-CM | POA: Diagnosis not present

## 2019-07-02 ENCOUNTER — Telehealth: Payer: Self-pay | Admitting: Neurology

## 2019-07-02 NOTE — Telephone Encounter (Signed)
Okay with me 

## 2019-07-02 NOTE — Telephone Encounter (Signed)
The pt seen Dr. Rexene Alberts in 2016 for sleep per the referral they are requesting Dr. Brett Fairy. Are you okay with the pt switching providers?

## 2019-07-02 NOTE — Telephone Encounter (Signed)
OK with switch for sleep .

## 2019-07-10 ENCOUNTER — Ambulatory Visit: Payer: Self-pay

## 2019-07-10 ENCOUNTER — Encounter: Payer: Self-pay | Admitting: Physical Medicine and Rehabilitation

## 2019-07-10 ENCOUNTER — Ambulatory Visit (INDEPENDENT_AMBULATORY_CARE_PROVIDER_SITE_OTHER): Payer: Medicare Other | Admitting: Physical Medicine and Rehabilitation

## 2019-07-10 ENCOUNTER — Other Ambulatory Visit: Payer: Self-pay

## 2019-07-10 ENCOUNTER — Ambulatory Visit (INDEPENDENT_AMBULATORY_CARE_PROVIDER_SITE_OTHER): Payer: Medicare Other

## 2019-07-10 VITALS — BP 129/74 | HR 59 | Ht 64.0 in | Wt 215.0 lb

## 2019-07-10 DIAGNOSIS — M25511 Pain in right shoulder: Secondary | ICD-10-CM

## 2019-07-10 DIAGNOSIS — M25512 Pain in left shoulder: Secondary | ICD-10-CM

## 2019-07-10 NOTE — Progress Notes (Signed)
.   .  Numeric Pain Rating Scale and Functional Assessment Average Pain 10 Pain Right Now 5 My pain is constant, sharp and dull Pain is worse with: bending and some activites Pain improves with: medication   In the last MONTH (on 0-10 scale) has pain interfered with the following?  1. General activity like being  able to carry out your everyday physical activities such as walking, climbing stairs, carrying groceries, or moving a chair?  Rating(9)  2. Relation with others like being able to carry out your usual social activities and roles such as  activities at home, at work and in your community. Rating(9)  3. Enjoyment of life such that you have  been bothered by emotional problems such as feeling anxious, depressed or irritable?  Rating(6)

## 2019-07-11 DIAGNOSIS — M25511 Pain in right shoulder: Secondary | ICD-10-CM

## 2019-07-11 MED ORDER — BUPIVACAINE HCL 0.5 % IJ SOLN
3.0000 mL | INTRAMUSCULAR | Status: AC | PRN
  Administered 2019-07-11: 3 mL via INTRA_ARTICULAR

## 2019-07-11 MED ORDER — TRIAMCINOLONE ACETONIDE 40 MG/ML IJ SUSP
60.0000 mg | INTRAMUSCULAR | Status: AC | PRN
  Administered 2019-07-11: 60 mg via INTRA_ARTICULAR

## 2019-07-11 NOTE — Progress Notes (Signed)
Lisa Mcclure - 86 y.o. female MRN [id number]  Date of birth: May 04, 1932  Office Visit Note: Visit Date: 07/10/2019 PCP: [name], MD Referred by: [name], MD  Subjective: Chief Complaint  Patient presents with  . Right Shoulder - Pain  . Left Shoulder - Pain   HPI: Lisa Mcclure is a 86 y.o. female who comes in today For evaluation management of chronic worsening severe right more than left shoulder pain with decreased range of motion.  She also complains of left lateral hip pain.  There is represent chronic long-term problems for her with a recent exacerbation from a severe standpoint her average pain is 10 out of 10 constant sharp and dull pain particularly with her arms and movement.  Her left hip is worse with laying on the side and with movement.  She denies any groin pain.  She has had no recent trauma or falls.  She is followed mainly by her primary care physician Dr. Janie Morning.  I originally saw the patient several years ago for her low back and more recently in October she had flareup of her back pain.  Today she is not complaining of that but does have a history of facet arthritis and listhesis but without any advanced imaging recently.  In terms of her shoulder pain she denies any radicular pain or paresthesias in the arms.  No weakness in the distal hands.  She does complain of arthritis in the hands.  In the past she has been pretty intolerant of medications.  Her shoulders have been evaluated and managed and followed for a long time by Dr. Justice Britain.  According to the patient he has told her there is really nothing he can do from a surgical standpoint for her shoulders.  She does report right more than left with decreased range of motion significantly and more pain recently.  She does have some neck pain.  She continues to use hydrocodone 7.5 mg 120 tablets over 2 months time span.  I did obtain x-rays of both shoulders today and this is reviewed with the  patient and reviewed below as far as the report.  End-stage osteoarthritis of both joints.  Review of Systems  Musculoskeletal: Positive for back pain, joint pain and neck pain.  All other systems reviewed and are negative.  Otherwise per HPI.  Assessment & Plan: Visit Diagnoses:  1. Right shoulder pain, unspecified chronicity   2. Left shoulder pain, unspecified chronicity     Plan: Findings:  1.  Chronic history of bilateral shoulder pain right more than left with history of severe arthritis and imaging today showing end-stage arthritis.  Orthopedic surgeon Dr. Barbee Cough told her that she is really nothing she can do at this point from a surgical standpoint.  She has not had any injection treatment of the shoulders recently.  We decided today to complete diagnostic and hopefully therapeutic intra-articular glenohumeral joint injection with fluoroscopic guidance.  Could consider viscosupplementation, PRP or prolotherapy.  Could entertain the idea of radiofrequency ablation.  I offered her a second opinion with one of the spine surgeons here Dr. Anderson Malta and she declined this.  I did give her some shoulder exercises to do as well.  2.  Chronic worsening low back and left hip pain.  Clearly has some level of greater trochanteric pain syndrome but this could be also mimicked by some radicular type complaint.  Nothing past the knee however.  We will see her back in  a few weeks and potentially address the greater trochanteric issue she did get some relief from prior injection.  May have to look at MRI of the lumbar spine at some point.    Meds & Orders: No orders of the defined types were placed in this encounter.   Orders Placed This Encounter  Procedures  . Large Joint Inj: R glenohumeral  . XR Shoulder Right  . XR Shoulder Left  . XR C-ARM NO REPORT    Follow-up: Return in about 4 weeks (around 08/07/2019).   Procedures: Large Joint Inj: R glenohumeral on 07/11/2019 6:16  AM Indications: pain and diagnostic evaluation Details: 22 G 3.5 in needle, fluoroscopy-guided anteromedial approach  Arthrogram: No  Medications: 3 mL bupivacaine 0.5 %; 60 mg triamcinolone acetonide 40 MG/ML Outcome: tolerated well, no immediate complications  There was excellent flow of contrast producing a partial arthrogram of the glenohumeral joint. The patient did have minor relief of symptoms during the anesthetic phase of the injection.  May have to take more of a superior approach to getting into the joint because of the end-stage nature of the arthritic change Procedure, treatment alternatives, risks and benefits explained, specific risks discussed. Consent was given by the patient. Immediately prior to procedure a time out was called to verify the correct patient, procedure, equipment, support staff and site/side marked as required. Patient was prepped and draped in the usual sterile fashion.      No notes on file   Clinical History: LUMBAR SPINE - COMPLETE 4+ VIEW  COMPARISON: Chest x-ray dated 03/05/2017.  FINDINGS: There is an age-indeterminate compression fracture of the T12 and T11 vertebral bodies. There is some mild height loss of L2 vertebral body. Degenerative changes are noted throughout the visualized lumbar spine. These are greatest at the lower lumbar segments were there is multilevel facet arthrosis and significant multilevel disc height loss. There appears to be a grade 1-2 anterolisthesis of L5 on S1. Diffuse osteopenia is noted.  IMPRESSION: 1. Age-indeterminate height loss of the T11, T12, and L2 vertebral bodies. Correlation with point tenderness is recommended. If there is clinical concern for an acute fracture this can be further evaluated with cross-sectional imaging. 2. Multilevel degenerative changes throughout the lumbar spine. 3. Grade 1-2 anterolisthesis of L5 on S1. 4. Osteopenia.   Electronically Signed By: Constance Holster  M.D. On: 09/14/2018 19:25 ------- EXAM: PELVIS - 1-2 VIEW  COMPARISON:  None.  FINDINGS: There is no acute displaced fracture or dislocation. There is advanced osteoarthritis of the right hip. There is mild-to-moderate osteoarthritis of the left hip. There are degenerative changes of the pubic symphysis. There is osteopenia.  IMPRESSION: 1. No acute displaced fracture or dislocation. 2. Advanced osteoarthritis of the right hip. 3. Mild-to-moderate osteoarthritis of the left hip. 4. Osteopenia which limits detection of nondisplaced fractures.   Electronically Signed   By: Constance Holster M.D.   On: 09/14/2018 19:20   She reports that she quit smoking about 35 years ago. Her smoking use included cigarettes. She has a 2.00 pack-year smoking history. She has never used smokeless tobacco. No results for input(s): HGBA1C, LABURIC in the last 8760 hours.  Objective:  VS:  HT:5\' 4"  (162.6 cm)   WT:215 lb (97.5 kg)  BMI:36.89    BP:129/74  HR:(!) 59bpm  TEMP: ( )  RESP:  Physical Exam Vitals and nursing note reviewed.  Constitutional:      General: She is not in acute distress.    Appearance: Normal appearance. She is  well-developed. She is obese.  HENT:     Head: Normocephalic and atraumatic.     Nose: Nose normal.     Mouth/Throat:     Mouth: Mucous membranes are moist.     Pharynx: Oropharynx is clear.  Eyes:     Conjunctiva/sclera: Conjunctivae normal.     Pupils: Pupils are equal, round, and reactive to light.  Cardiovascular:     Rate and Rhythm: Regular rhythm.  Pulmonary:     Effort: Pulmonary effort is normal. No respiratory distress.  Abdominal:     General: There is no distension.     Palpations: Abdomen is soft.     Tenderness: There is no guarding.  Musculoskeletal:     Cervical back: Neck supple. No rigidity.     Right lower leg: Edema present.     Left lower leg: Edema present.     Comments: Patient has difficulty going from sit to stand with  pain with extension of the lumbar spine.  She has pain over the left greater trochanter.  No pain with hip rotation.  Examination of the cervical spine shows forward flexed cervical spine pain at end ranges of rotation.  She has significant decreased range of moti of the right shoulder with abduction she has pain with internal and external rotation.  Similar findings on the left.  No pain over the [facility] joint.  Good distal strength in the hands.  Osteoarthritic changes of both hands.  Skin:    General: Skin is warm and dry.     Findings: No erythema or rash.  Neurological:     General: No focal deficit present.     Mental Status: She is alert and oriented to person, place, and time.     Sensory: No sensory deficit.     Motor: No weakness or abnormal muscle tone.     Coordination: Coordination normal.     Gait: Gait abnormal.  Psychiatric:        Mood and Affect: Mood normal.        Behavior: Behavior normal.        Thought Content: Thought content normal.     Ortho Exam Imaging: XR C-ARM NO REPORT  Result Date: 07/10/2019 Please see Notes tab for imaging impression.  XR Shoulder Left  Result Date: 07/11/2019 3 views of the left shoulder show end-stage osteoarthritis with high riding humeral head but no fracture or dislocation.  XR Shoulder Right  Result Date: 07/11/2019 3 views of the right shoulder show end-stage osteoarthritis with high riding humeral head but no fracture or dislocation.   Past Medical/Family/Surgical/Social History: Medications & Allergies reviewed per EMR, new medications updated. Patient Active Problem List   Diagnosis Date Noted  . Dehydration with hyponatremia 09/13/2018  . Bradycardia 09/13/2018  . Glaucoma 09/13/2018  . OA (osteoarthritis) of shoulder 05/16/2018  . Aortic stenosis, mild 01/15/2016  . Bilateral carotid bruits 05/11/2015  . Hyponatremia 04/19/2015  . Chronic anemia 04/19/2015  . Panic attack 04/19/2015  . Constipation 04/19/2015  .  Hyperkalemia 04/17/2015  . Influenza A 04/16/2015  . Proximal humerus fracture   . Left patella fracture   . Benign essential HTN   . Esophageal reflux   . Chronic diastolic heart failure ([place])   . Hypothyroidism   . Lower GI bleed 04/08/2015  . Hematochezia   . Rectal bleeding 04/03/2015  . Patellar fracture 03/06/2015  . Left humeral fracture 11/04/2013  . Rib fracture 11/04/2013  . Hypothyroid 11/04/2013  . Hypertension 11/04/2013  .  Right shoulder pain 11/04/2013  . OSA (obstructive sleep apnea) 11/13/2008  . BOOP (bronchiolitis obliterans with organizing pneumonia) ([place]) 08/25/2008   Past Medical History:  Diagnosis Date  . Anemia    h/o hemorrhoidal bleeding and blood transfusion  . Cardiomegaly   . Chronic diastolic CHF (congestive heart failure) ([place])   . Depression   . Diverticulosis   . DOE (dyspnea on exertion)   . Esophageal reflux   . GERD (gastroesophageal reflux disease)   . Hypothyroidism   . Insomnia   . LBP (low back pain)   . OAB (overactive bladder)   . Obesity   . OSA (obstructive sleep apnea)   . Osteoarthritis   . Rheumatoid arthritis(714.0)   . Shoulder pain, bilateral   . Unspecified essential hypertension   . Venous insufficiency    Family History  Problem Relation Age of Onset  . COPD Father   . Lung cancer Mother   . Heart attack Maternal Grandmother 80  . Breast cancer Maternal Aunt   . Lung cancer Son    Past Surgical History:  Procedure Laterality Date  . APPENDECTOMY  1953  . bladder abduction-1996  1996  . breast biopsy Right 1980  . [place]  . COSMETIC SURGERY  1996  . CYSTOCELE REPAIR    . FLEXIBLE SIGMOIDOSCOPY N/A 04/10/2015   Procedure: FLEXIBLE SIGMOIDOSCOPY;  Surgeon: Arta Silence, MD;  Location[facility] ENDOSCOPY;  Service: Endoscopy;  Laterality: N/A;  . knee arthroscopy Right 1996, 2010  . KNEE ARTHROSCOPY W/ AUTOGENOUS CARTILAGE IMPLANTATION (ACI) PROCEDURE Left 1994, 1995  .  SHOULDER SURGERY  1990  . TOTAL ABDOMINAL HYSTERECTOMY  1972  . VESICOVAGINAL FISTULA CLOSURE W/ TAH    . WRIST SURGERY  1967   Social History   Occupational History  . Occupation: Retired  Tobacco Use  . Smoking status: Former Smoker    Packs/day: 0.20    Years: 10.00    Pack years: 2.00    Types: Cigarettes    Quit date: 04/11/1984    Years since quitting: 35.2  . Smokeless tobacco: Never Used  Substance and Sexual Activity  . Alcohol use: Yes    Alcohol/week: 0.0 standard drinks    Comment: cocktail once a week  . Drug use: No  . Sexual activity: Not on file

## 2019-07-24 ENCOUNTER — Ambulatory Visit: Payer: Medicare Other | Admitting: Physical Medicine and Rehabilitation

## 2019-07-29 ENCOUNTER — Encounter: Payer: Self-pay | Admitting: Neurology

## 2019-07-29 ENCOUNTER — Other Ambulatory Visit: Payer: Self-pay

## 2019-07-29 ENCOUNTER — Ambulatory Visit (INDEPENDENT_AMBULATORY_CARE_PROVIDER_SITE_OTHER): Payer: Medicare Other | Admitting: Neurology

## 2019-07-29 VITALS — BP 161/73 | HR 66 | Ht 63.5 in | Wt 221.0 lb

## 2019-07-29 DIAGNOSIS — I10 Essential (primary) hypertension: Secondary | ICD-10-CM | POA: Diagnosis not present

## 2019-07-29 DIAGNOSIS — J449 Chronic obstructive pulmonary disease, unspecified: Secondary | ICD-10-CM | POA: Diagnosis not present

## 2019-07-29 DIAGNOSIS — M19011 Primary osteoarthritis, right shoulder: Secondary | ICD-10-CM | POA: Diagnosis not present

## 2019-07-29 DIAGNOSIS — I5032 Chronic diastolic (congestive) heart failure: Secondary | ICD-10-CM

## 2019-07-29 DIAGNOSIS — G4733 Obstructive sleep apnea (adult) (pediatric): Secondary | ICD-10-CM

## 2019-07-29 DIAGNOSIS — G2581 Restless legs syndrome: Secondary | ICD-10-CM | POA: Insufficient documentation

## 2019-07-29 NOTE — Patient Instructions (Signed)
Obesity Hypoventilation Syndrome  Obesity hypoventilation syndrome (OHS) means that you are not breathing well enough to get air in and out of your lungs efficiently (ventilation). This causes a low oxygen level and a high carbon dioxide level in your blood (hypoventilation). Having too much total body fat (obesity) is a significant risk factor for developing OHS. OHS makes it harder for your heart to pump oxygen-rich blood to your body. It can cause sleep disturbances and make you feel sleepy during the day. Over time, OHS can increase your risk for:  Heart disease.  High blood pressure (hypertension).  Reduced ability to absorb sugar from the bloodstream (insulin resistance).  Heart failure. Over time, OHS weakens your heart and can lead to heart failure. What are the causes? The exact cause of OHS is not known. Possible causes include:  Pressure on the lungs from excess body weight.  Obesity-related changes in how much air the lungs can hold (lung capacity) and how much they can expand (lung compliance).  Failure of the brain to regulate oxygen and carbon dioxide levels properly.  Chemicals (hormones) produced by excess fat cells interfering with breathing regulation.  A breathing condition in which breathing pauses or becomes shallow during sleep (sleep apnea). This condition can eventually cause the body to ventilate poorly and to hold onto carbon dioxide during the day. What increases the risk? You may have a greater risk for OHS if you:  Have a BMI of 30 or higher. BMI is an estimate of body fat that is calculated from height and weight. For adults, a BMI of 30 or higher is considered obese.  Are 40?84 years old.  Carry most of your excess weight around your waist.  Experience moderate symptoms of sleep apnea. What are the signs or symptoms? The most common symptoms of OHS are:  Daytime sleepiness.  Lack of energy.  Shortness of breath.  Morning headaches.  Sleep  apnea.  Trouble concentrating.  Irritability, mood swings, or depression.  Swollen veins in the neck.  Swelling of the legs. How is this diagnosed? Your health care provider may suspect OHS if you are obese and have poor breathing during the day and at night. Your health care provider will also do a physical exam. You may have tests to:  Measure your BMI.  Measure your blood oxygen level with a sensor placed on your finger (pulse oximetry).  Measure blood oxygen and carbon dioxide in a blood sample.  Measure the amount of red blood cells in a blood sample. OHS causes the number of red blood cells you have to increase (polycythemia).  Check your breathing ability (pulmonary function testing).  Check your breathing ability, breathing patterns, and oxygen level while you sleep (sleep study). You may also have a chest X-ray to rule out other breathing problems. You may have an electrocardiogram (ECG) and or echocardiogram to check for signs of heart failure. How is this treated? Weight loss is the most important part of treatment for OHS, and it may be the only treatment that you need. Other treatments may include:  Using a device to open your airway while you sleep, such as a continuous positive airway pressure (CPAP) machine that delivers oxygen to your airway through a mask.  Surgery (gastric bypass surgery) to lower your BMI. This may be needed if: ? You are very obese. ? Other treatments have not worked for you. ? Your OHS is very severe and is causing organ damage, such as heart failure. Follow these   instructions at home:  Medicines  Take over-the-counter and prescription medicines only as told by your health care provider.  Ask your health care provider what medicines are safe for you. You may be told to avoid medicines that can impair breathing and make OHS worse, such as sedatives and narcotics. Sleeping habits  If you are prescribed a CPAP machine, make sure you  understand and use the machine as directed.  Try to get 8 hours of sleep every night.  Go to bed at the same time every night, and get up at the same time every day. General instructions  Work with your health care provider to make a diet and exercise plan that helps you reach and maintain a healthy weight.  Eat a healthy diet.  Avoid smoking.  Exercise regularly as told by your health care provider.  During the evening, do not drink caffeine and do not eat heavy meals.  Keep all follow-up visits as told by your health care provider. This is important. Contact a health care provider if:  You experience new or worsening shortness of breath.  You have chest pain.  You have an irregular heartbeat (palpitations).  You have dizziness.  You faint.  You develop a cough.  You have a fever.  You have chest pain when you breathe (pleurisy). This information is not intended to replace advice given to you by your health care provider. Make sure you discuss any questions you have with your health care provider. Document Revised: 07/20/2018 Document Reviewed: 09/07/2015 [employer]vier Patient Education  2020 [employer]  

## 2019-07-29 NOTE — Progress Notes (Addendum)
SLEEP MEDICINE CLINIC    Provider:  Larey Seat, MD  Primary Care Physician:  Lisa Battles, MD [place] [address]     Referring Provider: Leanna Mcclure, [address] [place],  [place] [address]          Chief Complaint according to patient   Patient presents with:    . New Patient (Initial Visit)     Wants to know if she needs another sleep study. Pt does not have a cpap.      HISTORY OF PRESENT ILLNESS:  Lisa Mcclure is an 86 year- old Caucasian female patient and seen upon referral on 07/29/2019 from Lisa. Philip Mcclure.  Chief concern according to patient : Lisa Mcclure has had a 2010 sleep study with Lisa Mcclure and a 2016 visit with Lisa Mcclure.     I have the pleasure of seeing Lisa Mcclure today, a right-handed White or Caucasian female with a known OSA- sleep disorder, had been diagnosed with severe OSA, couldn't tolerate CPAP nor dental device. She has a past medical history of Anemia, Cardiomegaly, Chronic diastolic CHF (congestive heart failure) ([place]), Depression, Diverticulosis, DOE (dyspnea on exertion), Esophageal reflux, GERD (gastroesophageal reflux disease), Hypothyroidism, Insomnia, LBP (low back pain), OAB (overactive bladder), Obesity, OSA (obstructive sleep apnea), Osteoarthritis, Rheumatoid arthritis(714.0), Shoulder pain, bilateral, Unspecified essential hypertension, and Venous insufficiency.   Sleep relevant medical history: I have untreated apnea.   Social history:  Patient is  Widowed, and retired from a Theatre manager and lives in a household alone  .  Family status is widowed , with 3 adult children, many grandchildren.  She has a living brother in [place], her middle son passed of cancer.  Pets are present- a dog.  Tobacco use; former .  ETOH use rare ,  Caffeine intake in form of Coffee( 1 cup in AM) Soda( none) Tea ( none) or energy drinks. Regular exercise in form of walking .   Hobbies : [place], she  misses the summer times in [place]. She lived in [place] state. Drove a motor home.     Sleep habits are as follows: The patient's dinner time is at 6 PM. The patient goes to bed at 11.30 PM and continues to sleep for immediately for many hours- she has frequent nocturia, wakes every 2 hours for bathroom breaks.   The preferred sleep position is laterally, with the support of 1 pillows.  Dreams are reportedly frequent/vivid.Marland Kitchen  6 AM is the usual rise time. The patient wakes up spontaneously at 4 AM , often due to right hip and right shoulder pain. She is always wet- enuresis.  She reports not feeling refreshed or restored in AM, with symptoms such as dry mouth , morning headaches , and residual fatigue.  Naps are taken infrequently, she would like to, but she can't.   Review of Systems: Out of a complete 14 system review, the patient complains of only the following symptoms, and all other reviewed systems are negative.:  Fatigue, sleepiness , snoring, fragmented sleep, early morning arousals due pain NOCTURIA.  Insomnia    How likely are you to doze in the following situations: 0 = not likely, 1 = slight chance, 2 = moderate chance, 3 = high chance   Sitting and Reading? Watching Television? Sitting inactive in a public place (theater or meeting)? As a passenger in a car for an hour without a break? Lying down in the afternoon when circumstances permit? Sitting and  talking to someone? Sitting quietly after lunch without alcohol? In a car, while stopped for a few minutes in traffic?   Total = 11/ 24 points   FSS endorsed at 40/ 63 points.   Social History   Socioeconomic History  . Marital status: Widowed    Spouse name: Not on file  . Number of children: 3  . Years of education: College  . Highest education level: Not on file  Occupational History  . Occupation: Retired  Tobacco Use  . Smoking status: Former Smoker    Packs/day: 0.20    Years: 10.00    Pack years: 2.00     Types: Cigarettes    Quit date: 04/11/1984    Years since quitting: 35.3  . Smokeless tobacco: Never Used  Substance and Sexual Activity  . Alcohol use: Yes    Alcohol/week: 0.0 standard drinks    Comment: cocktail once a week  . Drug use: No  . Sexual activity: Not on file  Other Topics Concern  . 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 Difficulty of Paying Living Expenses:   Food Insecurity:   . Worried About Charity fundraiser in the Last Year:   . Arboriculturist in the Last Year:   Transportation Needs:   . Film/video editor (Medical):   Marland Kitchen Lack of Transportation (Non-Medical):   Physical Activity:   . Days of Exercise per Week:   . Minutes of Exercise per Session:   Stress:   . Feeling of Stress :   Social Connections:   . Frequency of Communication with Friends and Family:   . Frequency of Social Gatherings with Friends and Family:   . Attends Religious Services:   . Active Member of Clubs or Organizations:   . Attends Archivist Meetings:   Marland Kitchen Marital Status:     Family History  Problem Relation Age of Onset  . COPD Father   . Lung cancer Mother   . Heart attack Maternal Grandmother 80  . Breast cancer Maternal Aunt   . Lung cancer Son     Past Medical History:  Diagnosis Date  . Anemia    h/o hemorrhoidal bleeding and blood transfusion  . Cardiomegaly   . Chronic diastolic CHF (congestive heart failure) ([place])   . Depression   . Diverticulosis   . DOE (dyspnea on exertion)   . Esophageal reflux   . GERD (gastroesophageal reflux disease)   . Hypothyroidism   . Insomnia   . LBP (low back pain)   . OAB (overactive bladder)   . Obesity   . OSA (obstructive sleep apnea)   .  Osteoarthritis   . Rheumatoid arthritis(714.0)   . Shoulder pain, bilateral   . Unspecified essential hypertension   . Venous insufficiency     Past Surgical History:  Procedure Laterality Date  . APPENDECTOMY  1953  . bladder abduction-1996  1996  . breast biopsy Right 1980  . [place]  . COSMETIC SURGERY  1996  . CYSTOCELE REPAIR    . FLEXIBLE SIGMOIDOSCOPY N/A 04/10/2015   Procedure: FLEXIBLE SIGMOIDOSCOPY;  Surgeon: Arta Silence, MD;  [facility];  Service: Endoscopy;  Laterality: N/A;  . knee arthroscopy Right 1996, 2010  . KNEE ARTHROSCOPY W/ AUTOGENOUS CARTILAGE IMPLANTATION (ACI) PROCEDURE Left 1994, 1995  . SHOULDER SURGERY  1990  . TOTAL ABDOMINAL HYSTERECTOMY  1972  . VESICOVAGINAL FISTULA CLOSURE W/ TAH    . WRIST SURGERY  1967     Current Outpatient Medications on File Prior to Visit  Medication Sig Dispense Refill  . amLODipine (NORVASC) 10 MG tablet Take 1 tablet (10 mg total) by mouth daily. 90 tablet 1  . aspirin EC 81 MG tablet Take 81 mg by mouth daily.    . Bempedoic Acid-Ezetimibe (NEXLIZET) 180-10 MG TABS Take 1 capsule by mouth daily. 30 tablet 3  . carvedilol (COREG) 25 MG tablet Take 25 mg by mouth 2 (two) times daily.    . Cholecalciferol (VITAMIN D3) 125 MCG (5000 UT) CAPS Take 5,000 Units by mouth daily with breakfast.    . diclofenac sodium (VOLTAREN) 1 % GEL Apply 2.25 g topically 3 (three) times daily as needed.    . fluticasone (FLONASE) 50 MCG/ACT nasal spray Place 2 sprays into both nostrils daily as needed for allergies or rhinitis.     . furosemide (LASIX) 20 MG tablet Take 1 tablet (20 mg total) by mouth daily. 30 tablet   . hydrALAZINE (APRESOLINE) 25 MG tablet TAKE 1 TABLET 3 TIMES A DAY 270 tablet 1  . HYDROcodone-acetaminophen (NORCO) 7.5-325 MG tablet Take 1 tablet by mouth every 6 (six) hours as needed for moderate pain. 20 tablet 0  . hydrocortisone (ANUSOL-HC) 2.5 % rectal cream Apply 1  application topically 4 (four) times daily as needed for hemorrhoids. 30 g 0  . latanoprost (XALATAN) 0.005 % ophthalmic solution Place 1 drop into both eyes at bedtime.    Marland Kitchen levothyroxine (SYNTHROID, LEVOTHROID) 100 MCG tablet Take 100 mcg by mouth daily before breakfast.    . losartan (COZAAR) 100 MG tablet Take 1 tablet (100 mg total) by mouth 2 (two) times daily. 180 tablet 1  . Multiple Vitamins-Minerals (ICAPS AREDS 2 PO) Take by mouth 2 (two) times daily.    Marland Kitchen omeprazole (PRILOSEC) 20 MG capsule Take 20 mg by mouth 2 (two) times daily before a meal.     . rOPINIRole (REQUIP) 1 MG tablet Take 1 mg by mouth See admin instructions. Take 1 mg by mouth once a day between 3 PM-4 PM and an additional 1 mg in the evening, if no relief  12   No current facility-administered medications on file prior to visit.    Allergies  Allergen Reactions  . Statins Other (See Comments)    Muscle aches and INTERNAL BLEEDING  . Gabapentin Other (See Comments)    "Made me loopy"  . Methocarbamol Other (See Comments)    "Made me loopy"    Physical exam:  Today's Vitals   07/29/19 1254  BP: (!) 161/73  Pulse: 66  Weight: 221 lb (100.2 kg)  Height: 5' 3.5" (1.613 m)   Body mass index is 38.53 kg/m.   Wt Readings from Last 3 Encounters:  07/29/19 221 lb (100.2 kg)  07/10/19 215 lb (97.5 kg)  03/18/19 209 lb 4.8 oz (94.9 kg)     Ht Readings from Last 3 Encounters:  07/29/19 5' 3.5" (1.613 m)  07/10/19 5\' 4"  (1.626 m)  03/18/19 5' 3.5" (1.613 m)      General: The patient is awake, alert and appears not in acute  distress. The patient is well groomed. Head: Normocephalic, atraumatic. Neck is supple. Mallampati 3 plus ,  neck circumference:16.5 inches . Nasal airflow not  Patent/ congested.   Retrognathia is seen.  Dental status: irregular  Cardiovascular:  Regular rate and cardiac rhythm by pulse,  without distended neck veins. Respiratory: Lungs are clear to auscultation.  Skin:  Without  evidence of ankle edema, or rash. Trunk: The patient's posture is stooped, .   Neurologic exam : The patient is awake and alert, oriented to place and time.   Memory subjective described as intact.  Attention span & concentration ability appears normal.  Speech is fluent,  without  dysarthria, dysphonia or aphasia.  Mood and affect are appropriate.   Cranial nerves: no loss of smell or taste reported  Pupils are equal and briskly reactive to light. Funduscopic exam deferred.   Extraocular movements in vertical and horizontal planes were intact , but  accomodation reveals a lazy eye on the right - without nystagmus.  No Diplopia. Visual fields by finger perimetry are intact. Hearing was intact to soft voice and finger rubbing.    Facial sensation intact to fine touch.  Facial motor strength is symmetric and tongue and uvula move midline.  Neck ROM : rotation, tilt and flexion extension were normal for age and shoulder shrug was symmetrical.    Motor exam:  Symmetric bulk, tone and ROM.  she has bilateral knee replacements.  Lisa Maureen Ralphs. Normal tone without cog wheeling, symmetric grip strength . Sensory:  Fine touch and vibration were normal.  Proprioception tested in the upper extremities was normal. Coordination: Rapid alternating movements in the fingers/hands were of normal speed.  The Finger-to-nose maneuver was intact without evidence of ataxia, dysmetria or tremor. Gait and station: Patient could rise unassisted from a seated position,  She walks with a cane and arthralgic- walked without assistive device.  Toe and heel walk were deferred.  Deep tendon reflexes: in the  upper and lower extremities are symmetric and intact.  Babinski response was deferred.    Patient has recently fallen, she is sleepier,  but she fells also more incoordinated. She has been SOB, has congestive heart failure, secondary smoke exposure history (COPD)  , allergic rhinitis since in [place]. BMI of 38. Narrow  airway and retrognathia. witnessed snoring. Deviated septum. On flonase.    After spending a total time of 45  minutes face to face and additional time for physical and neurologic examination, review of laboratory studies,  personal review of imaging studies, reports and results of other testing and review of referral information / records as far as provided in visit, I gave Mrs Daughtery a fitting for different mask. She has a frozen shoulder, making the headgear adjustments difficult.   Nocturnal incontinence. RLS and on narcotic pain medication and on Oxygen at home.   1) untreated OSa 2) SOB 3) COPD, CHF, Obesity.    My Plan is to proceed with:  1) attended sleep study with waterproof bed sheets.     I would like to thank Lisa Battles, MD and Lisa Mcclure, [place] [place],  [place] [address] for allowing me to meet with and to take care of this pleasant patient.   In short, ZAVEAH SMOTHERMON is presenting with untreated OSA and is on an oxygen concentrator at home.   I plan to follow up either personally or through our NP within 2 month.   CC: I will share my notes with PCP.  Electronically  signed by: Lisa Seat, MD 07/29/2019 1:16 PM  [facility] [place] certified by The [employer] of Sleep Medicine and Diplomate of the [employer] of Sleep Medicine. Board certified In Neurology through the [place], Fellow of the [employer] of Neurology. Medical Director of [employer].

## 2019-07-30 ENCOUNTER — Ambulatory Visit: Payer: Self-pay

## 2019-07-30 ENCOUNTER — Ambulatory Visit (INDEPENDENT_AMBULATORY_CARE_PROVIDER_SITE_OTHER): Payer: Medicare Other | Admitting: Physical Medicine and Rehabilitation

## 2019-07-30 ENCOUNTER — Encounter: Payer: Self-pay | Admitting: Physical Medicine and Rehabilitation

## 2019-07-30 VITALS — BP 151/84 | HR 62 | Ht 63.5 in | Wt 215.0 lb

## 2019-07-30 DIAGNOSIS — M25512 Pain in left shoulder: Secondary | ICD-10-CM | POA: Diagnosis not present

## 2019-07-30 DIAGNOSIS — M19011 Primary osteoarthritis, right shoulder: Secondary | ICD-10-CM | POA: Diagnosis not present

## 2019-07-30 DIAGNOSIS — M25511 Pain in right shoulder: Secondary | ICD-10-CM

## 2019-07-30 DIAGNOSIS — M545 Low back pain, unspecified: Secondary | ICD-10-CM

## 2019-07-30 DIAGNOSIS — M7062 Trochanteric bursitis, left hip: Secondary | ICD-10-CM | POA: Diagnosis not present

## 2019-07-30 DIAGNOSIS — H353132 Nonexudative age-related macular degeneration, bilateral, intermediate dry stage: Secondary | ICD-10-CM | POA: Diagnosis not present

## 2019-07-30 DIAGNOSIS — G8929 Other chronic pain: Secondary | ICD-10-CM | POA: Diagnosis not present

## 2019-07-30 NOTE — Progress Notes (Signed)
Pt states pain in the right shoulder and radiates into the right upper arm. Pt also states pain in the left hip (no groin pain). Pt states injection 07/10/19 helped a lot. Pt states combing hair and reaching above the head makes pain worse for shoulders. Pt states walking makes pain worse in the left hip. Pt states hot showers and massage machine helps with pain in both areas.   .Numeric Pain Rating Scale and Functional Assessment Average Pain 6 Pain Right Now 3 My pain is constant, dull and aching Pain is worse with: walking, bending and some activites Pain improves with: rest, heat/ice and therapy/exercise   In the last MONTH (on 0-10 scale) has pain interfered with the following?  1. General activity like being  able to carry out your everyday physical activities such as walking, climbing stairs, carrying groceries, or moving a chair?  Rating(5)  2. Relation with others like being able to carry out your usual social activities and roles such as  activities at home, at work and in your community. Rating(5)  3. Enjoyment of life such that you have  been bothered by emotional problems such as feeling anxious, depressed or irritable?  Rating(4)

## 2019-07-31 NOTE — Progress Notes (Signed)
Lisa Mcclure - 86 y.o. female MRN [id number]  Date of birth: 01-25-1933  Office Visit Note: Visit Date: 07/30/2019 PCP: [name], MD Referred by: [name], MD  Subjective: Chief Complaint  Patient presents with  . Right Shoulder - Pain  . Right Upper Arm - Pain  . Left Hip - Pain   HPI: Lisa Mcclure is a 86 y.o. female who comes in today For reevaluation of chronic worsening right shoulder pain with radiating symptoms into the upper arm as well as the left hip.  She denies any specific groin pain.  She was originally referred by Dr. Janie Morning her primary care physician.  She reports prior injection into the glenohumeral joint on the right gave her a great amount relief and that was in March.  She had persisting symptoms recently without trauma or injury.  Unfortunately the injection really is just not helped very long but with this very helpful.  She reports decreased range of motion.  She has no radicular type pain or paresthesias.  Please see our prior notes for further details and review.  In terms of her left hip that is lateral hip pain no groin pain.  Pain is worse with walking and laying on the side.  She reports some relief with heating pad and hot showers and massage.  She has not had recent MRI of the lumbar spine but we did complete facet joint injections in October and those seem to help her back pain but not her hip pain.  Her hip pain was better after greater trochanteric bursa injection fluoroscopic guidance.  Guidance was utilized to the body habitus.  Patient's case is complicated by chronic hydrocodone use.  She has 120 tablets of 7.5 mg hydrocodone every 2 months.  This is managed by Dr. Philip Aspen  Review of Systems  Musculoskeletal: Positive for back pain and joint pain.  All other systems reviewed and are negative.  Otherwise per HPI.  Assessment & Plan: Visit Diagnoses:  1. Chronic right shoulder pain   2. Left shoulder pain, unspecified  chronicity   3. Arthritis of right glenohumeral joint   4. Greater trochanteric bursitis, left   5. Chronic bilateral low back pain without sciatica     Plan: Findings:  1.  Significant end-stage osteoarthritis of the right glenohumeral joint.  She has had prior shoulder surgery in the past.  Injection was very beneficial diagnostically just not helping very long.  We will repeat the injection 1 time and just see how she does from the longevity standpoint.  Consideration could be given to viscosupplementation if that could be approved or somehow done.  I would have her see consultation by Dr. Anderson Malta in our office or she can return to her prior orthopedic surgeon, Dr. Lennette Bihari supple.  2.  In terms of her left lateral hip pain it is not as painful right now but would have her come back for repeat greater trochanteric injection in the future.  She will let us know when she wants to do that.    Meds & Orders: No orders of the defined types were placed in this encounter.   Orders Placed This Encounter  Procedures  . Large Joint Inj: R glenohumeral  . XR C-ARM NO REPORT    Follow-up: Return if symptoms worsen or fail to improve, for Consider left greater trochanteric injection..   Procedures: Large Joint Inj: R glenohumeral on 07/30/2019 10:22 AM Indications: pain and diagnostic evaluation Details: 22 G  3.5 in needle, fluoroscopy-guided anteromedial approach  Arthrogram: No  Medications: 3 mL bupivacaine 0.5 %; 60 mg triamcinolone acetonide 40 MG/ML Outcome: tolerated well, no immediate complications  There was excellent flow of contrast producing a partial arthrogram of the glenohumeral joint. The patient did have relief of symptoms during the anesthetic phase of the injection. Procedure, treatment alternatives, risks and benefits explained, specific risks discussed. Consent was given by the patient. Immediately prior to procedure a time out was called to verify the correct patient,  procedure, equipment, support staff and site/side marked as required. Patient was prepped and draped in the usual sterile fashion.      No notes on file   Clinical History: LUMBAR SPINE - COMPLETE 4+ VIEW  COMPARISON: Chest x-ray dated 03/05/2017.  FINDINGS: There is an age-indeterminate compression fracture of the T12 and T11 vertebral bodies. There is some mild height loss of L2 vertebral body. Degenerative changes are noted throughout the visualized lumbar spine. These are greatest at the lower lumbar segments were there is multilevel facet arthrosis and significant multilevel disc height loss. There appears to be a grade 1-2 anterolisthesis of L5 on S1. Diffuse osteopenia is noted.  IMPRESSION: 1. Age-indeterminate height loss of the T11, T12, and L2 vertebral bodies. Correlation with point tenderness is recommended. If there is clinical concern for an acute fracture this can be further evaluated with cross-sectional imaging. 2. Multilevel degenerative changes throughout the lumbar spine. 3. Grade 1-2 anterolisthesis of L5 on S1. 4. Osteopenia.   Electronically Signed By: Constance Holster M.D. On: 09/14/2018 19:25 ------- EXAM: PELVIS - 1-2 VIEW  COMPARISON:  None.  FINDINGS: There is no acute displaced fracture or dislocation. There is advanced osteoarthritis of the right hip. There is mild-to-moderate osteoarthritis of the left hip. There are degenerative changes of the pubic symphysis. There is osteopenia.  IMPRESSION: 1. No acute displaced fracture or dislocation. 2. Advanced osteoarthritis of the right hip. 3. Mild-to-moderate osteoarthritis of the left hip. 4. Osteopenia which limits detection of nondisplaced fractures.   Electronically Signed   By: Constance Holster M.D.   On: 09/14/2018 19:20   She reports that she quit smoking about 35 years ago. Her smoking use included cigarettes. She has a 2.00 pack-year smoking history. She has  never used smokeless tobacco. No results for input(s): HGBA1C, LABURIC in the last 8760 hours.  Objective:  VS:  HT:5' 3.5" (161.3 cm)   WT:215 lb (97.5 kg)  BMI:37.48    BP:(!) 151/84  HR:62bpm  TEMP: ( )  RESP:  Physical Exam Vitals and nursing note reviewed.  Constitutional:      General: She is not in acute distress.    Appearance: Normal appearance. She is well-developed. She is obese. She is not ill-appearing.  HENT:     Head: Normocephalic and atraumatic.  Eyes:     Conjunctiva/sclera: Conjunctivae normal.     Pupils: Pupils are equal, round, and reactive to light.  Cardiovascular:     Rate and Rhythm: Normal rate.     Pulses: Normal pulses.  Pulmonary:     Effort: Pulmonary effort is normal.  Musculoskeletal:     Right lower leg: No edema.     Left lower leg: No edema.     Comments: Examination of the right shoulder shows decreased range of motion really in all planes.  There is a negative drop arm test.  She has some tenderness over the biceps tendon but not the AC joint.  She has good strength in  the hands bilaterally without any decreased sensation and negative Hoffmann's test bilaterally.  She has pain over the left greater trochanter which is concordant with her pain in the left hip.  No pain with hip rotation.  Good distal strength.  Skin:    General: Skin is warm and dry.     Findings: No erythema or rash.  Neurological:     General: No focal deficit present.     Mental Status: She is alert and oriented to person, place, and time.     Sensory: No sensory deficit.     Motor: No abnormal muscle tone.     Coordination: Coordination normal.     Gait: Gait normal.  Psychiatric:        Mood and Affect: Mood normal.        Behavior: Behavior normal.     Ortho Exam  Imaging: No results found.  Past Medical/Family/Surgical/Social History: Medications & Allergies reviewed per EMR, new medications updated. Patient Active Problem List   Diagnosis Date Noted  .  Severe hypoxemia 08/21/2019  . Severe obstructive sleep apnea 07/29/2019  . Chronic obstructive pulmonary disease ([place]) 07/29/2019  . RLS (restless legs syndrome) 07/29/2019  . Dehydration with hyponatremia 09/13/2018  . Bradycardia 09/13/2018  . Glaucoma 09/13/2018  . OA (osteoarthritis) of shoulder 05/16/2018  . Aortic stenosis, mild 01/15/2016  . Bilateral carotid bruits 05/11/2015  . Hyponatremia 04/19/2015  . Chronic anemia 04/19/2015  . Panic attack 04/19/2015  . Constipation 04/19/2015  . Hyperkalemia 04/17/2015  . Influenza A 04/16/2015  . Proximal humerus fracture   . Left patella fracture   . Benign essential HTN   . Esophageal reflux   . Chronic diastolic heart failure ([place])   . Hypothyroidism   . Lower GI bleed 04/08/2015  . Hematochezia   . Rectal bleeding 04/03/2015  . Patellar fracture 03/06/2015  . Left humeral fracture 11/04/2013  . Rib fracture 11/04/2013  . Hypothyroid 11/04/2013  . Hypertension 11/04/2013  . Right shoulder pain 11/04/2013  . OSA (obstructive sleep apnea) 11/13/2008  . BOOP (bronchiolitis obliterans with organizing pneumonia) ([place]) 08/25/2008   Past Medical History:  Diagnosis Date  . Anemia    h/o hemorrhoidal bleeding and blood transfusion  . Cardiomegaly   . Chronic diastolic CHF (congestive heart failure) ([place])   . Depression   . Diverticulosis   . DOE (dyspnea on exertion)   . Esophageal reflux   . GERD (gastroesophageal reflux disease)   . Hypothyroidism   . Insomnia   . LBP (low back pain)   . OAB (overactive bladder)   . Obesity   . OSA (obstructive sleep apnea)   . Osteoarthritis   . Rheumatoid arthritis(714.0)   . Shoulder pain, bilateral   . Unspecified essential hypertension   . Venous insufficiency    Family History  Problem Relation Age of Onset  . COPD Father   . Lung cancer Mother   . Heart attack Maternal Grandmother 80  . Breast cancer Maternal Aunt   . Lung cancer Son    Past Surgical History:    Procedure Laterality Date  . APPENDECTOMY  1953  . bladder abduction-1996  1996  . breast biopsy Right 1980  . [place]  . COSMETIC SURGERY  1996  . CYSTOCELE REPAIR    . FLEXIBLE SIGMOIDOSCOPY N/A 04/10/2015   Procedure: FLEXIBLE SIGMOIDOSCOPY;  Surgeon: Arta Silence, MD;  Location: [facility] ENDOSCOPY;  Service: Endoscopy;  Laterality: N/A;  .  knee arthroscopy Right 1996, 2010  . KNEE ARTHROSCOPY W/ AUTOGENOUS CARTILAGE IMPLANTATION (ACI) PROCEDURE Left 1994, 1995  . SHOULDER SURGERY  1990  . TOTAL ABDOMINAL HYSTERECTOMY  1972  . VESICOVAGINAL FISTULA CLOSURE W/ TAH    . WRIST SURGERY  1967   Social History   Occupational History  . Occupation: Retired  Tobacco Use  . Smoking status: Former Smoker    Packs/day: 0.20    Years: 10.00    Pack years: 2.00    Types: Cigarettes    Quit date: 04/11/1984    Years since quitting: 35.4  . Smokeless tobacco: Never Used  Vaping Use  . Vaping Use: Never used  Substance and Sexual Activity  . Alcohol use: Yes    Alcohol/week: 0.0 standard drinks    Comment: cocktail once a week  . Drug use: No  . Sexual activity: Not on file

## 2019-08-13 ENCOUNTER — Ambulatory Visit (INDEPENDENT_AMBULATORY_CARE_PROVIDER_SITE_OTHER): Payer: Medicare Other | Admitting: Neurology

## 2019-08-13 DIAGNOSIS — I5032 Chronic diastolic (congestive) heart failure: Secondary | ICD-10-CM

## 2019-08-13 DIAGNOSIS — J449 Chronic obstructive pulmonary disease, unspecified: Secondary | ICD-10-CM

## 2019-08-13 DIAGNOSIS — I10 Essential (primary) hypertension: Secondary | ICD-10-CM

## 2019-08-13 DIAGNOSIS — G4733 Obstructive sleep apnea (adult) (pediatric): Secondary | ICD-10-CM | POA: Diagnosis not present

## 2019-08-13 DIAGNOSIS — R0902 Hypoxemia: Secondary | ICD-10-CM

## 2019-08-13 DIAGNOSIS — M19011 Primary osteoarthritis, right shoulder: Secondary | ICD-10-CM

## 2019-08-13 DIAGNOSIS — M7501 Adhesive capsulitis of right shoulder: Secondary | ICD-10-CM

## 2019-08-19 DIAGNOSIS — H353231 Exudative age-related macular degeneration, bilateral, with active choroidal neovascularization: Secondary | ICD-10-CM | POA: Diagnosis not present

## 2019-08-19 DIAGNOSIS — H353112 Nonexudative age-related macular degeneration, right eye, intermediate dry stage: Secondary | ICD-10-CM | POA: Diagnosis not present

## 2019-08-19 DIAGNOSIS — H52203 Unspecified astigmatism, bilateral: Secondary | ICD-10-CM | POA: Diagnosis not present

## 2019-08-21 ENCOUNTER — Other Ambulatory Visit: Payer: Self-pay | Admitting: Neurology

## 2019-08-21 DIAGNOSIS — R0902 Hypoxemia: Secondary | ICD-10-CM | POA: Insufficient documentation

## 2019-08-21 NOTE — Progress Notes (Signed)
IMPRESSION:   1. Severe Obstructive Sleep Apnea (OSA) - patient has  difficulties with CPAP as her arm cannot be raised and she needed  to be fitted for an easy fit mask.  2. Severe hypoxemia, critical low level for a long time,  confirming oxygen need. O2 desaturations were worsening in deeper  sleep stages. The patient is clearly oxygen dependent!    RECOMMENDATIONS:   1. URGENTLY- Advise full-night, attended, Oxygen and PAP  titration study to optimize therapy. the patient's oxygen  dependence is not in question, we need to see if we can find a  PAP therapy and interface for her (!), she needs to be on 02.

## 2019-08-21 NOTE — Procedures (Signed)
PATIENT'S NAME:  Lisa Mcclure, Lisa Mcclure DOB:      04-May-1932      MR#:    [id number]     DATE OF RECORDING: 08/13/2019 [facility]  REFERRING M.D.:  [name], MD Study Performed:  Polysomnogram to confirm oxygen need and OSA. Patient with 02 dependence. HISTORY:  Lisa Mcclure is an 86 year- old Caucasian female patient and seen upon referral on 07/29/2019 from Dr. Philip Aspen.  Chief concern according to patient: [name] has had a 2010 sleep study with Dr. Halford Chessman and a 2016 visit with Dr. Rexene Alberts. She is considered oxygen dependent.  Has known OSA- sleep disorder, had been diagnosed with severe OSA, couldn't tolerate CPAP nor a dental device in the past. She has a medical history of Anemia, Cardiomegaly, Chronic diastolic CHF (congestive heart failure) ([place]), Depression, Diverticulosis, DOE (dyspnea on exertion), Esophageal reflux, GERD (gastroesophageal reflux disease), Hypothyroidism, Insomnia, OAB (overactive bladder), Obesity, OSA (obstructive sleep apnea), Osteoarthritis, Rheumatoid arthritis(714.0), Shoulder pain, bilateral, Unspecified essential hypertension, Venous insufficiency and Oxygen dependence.   The patient endorsed the Epworth Sleepiness Scale at 11/24 points.   The patient's weight 221 pounds with a height of 64 (inches), resulting in a BMI of 38.6 kg/m2. The patient's neck circumference measured 16.5 inches.  CURRENT MEDICATIONS: Norvasc, Vitamin D3, Flonase, Lasix, Apresoline, Norco, Anusol, Xalatan, Synthroid, Cozaar, Prilosec, Requip.   PROCEDURE:  This is a multichannel digital polysomnogram utilizing the Somnostar 11.2 system.  Electrodes and sensors were applied and monitored per AASM Specifications.   EEG, EOG, Chin and Limb EMG, were sampled at 200 Hz.  ECG, Snore and Nasal Pressure, Thermal Airflow, Respiratory Effort, CPAP Flow and Pressure, Oximetry was sampled at 50 Hz. Digital video and audio were recorded.      BASELINE STUDY: Lights Out was at 20:54 and Lights On at  04:59.  Total recording time (TRT) was 485 minutes, with a total sleep time (TST) of 278.5 minutes.   The patient's sleep latency was 7.5 minutes.  REM latency was 177.5 minutes.  The sleep efficiency was 57.4 %.     SLEEP ARCHITECTURE: WASO (Wake after sleep onset) was 205 minutes.  There were 27.5 minutes in Stage N1, 180.5 minutes Stage N2, 44.5 minutes Stage N3 and 26 minutes in Stage REM.  The percentage of Stage N1 was 9.9%, Stage N2 was 64.8%, Stage N3 was 16.% and Stage R (REM sleep) was 9.3%.   RESPIRATORY ANALYSIS:  There were a total of 227 respiratory events:  34 obstructive apneas, 7 central apneas and 2 mixed apneas with a total of 43 apneas and an apnea index (AI) of 9.3 /hour. There were 184 hypopneas with a hypopnea index of 39.6 /hour. The patient also had 0 respiratory event related arousals (RERAs).     The total APNEA/HYPOPNEA INDEX (AHI) was 48.9/hour and the total RESPIRATORY DISTURBANCE INDEX was 48.9 /hour.  30 events occurred in REM sleep and 336 events in NREM. The REM AHI was 69.2 /hour, versus a non-REM AHI of 46.8. The patient spent 0 minutes of total sleep time in the supine position and 279 minutes in non-supine.. The supine AHI was 0.0 versus a non-supine AHI of 48.9.  OXYGEN SATURATION & C02:  The Wake baseline 02 saturation was 86%, with the lowest being 58%. Time spent below 89% saturation equaled 231 minutes. The patient remained severely oxygen desaturated without any attempt to provide oxygen !  The arousals were noted as: 68 were spontaneous, 0 were associated with PLMs, 107 were  associated with respiratory events. The patient had a total of 0 Periodic Limb Movements.   Audio and video analysis did not show any abnormal or unusual movements, behaviors, phonations or vocalizations.  Snoring was noted. Single channel EKG with variable R to R intervals.   IMPRESSION:  1. Severe Obstructive Sleep Apnea (OSA) - patient has difficulties with CPAP as her arm cannot be  raised and she needed to be fitted for an easy fit mask.  2. Severe hypoxemia, critical low level for a long time, confirming oxygen need. O2 desaturations were worsening in deeper sleep stages. The patient is clearly oxygen dependent!    RECOMMENDATIONS:  1. URGENTLY- Advise full-night, attended, Oxygen and PAP titration study to optimize therapy. the patient's oxygen dependence is not in question, we need to see if we can find a PAP therapy and interface for her (!), she needs to be on 02.      I certify that I have reviewed the entire raw data recording prior to the issuance of this report in accordance with the Standards of Accreditation of the American Academy of Sleep Medicine (AASM)   Larey Seat, MD Diplomat, American Board of Psychiatry and Neurology  Diplomat, American Board of Sleep Medicine Market researcher, [place] [facility] [employer]

## 2019-08-21 NOTE — Addendum Note (Signed)
Addended by: Larey Seat on: 08/21/2019 04:50 PM   Modules accepted: Orders

## 2019-08-22 ENCOUNTER — Telehealth: Payer: Self-pay | Admitting: Neurology

## 2019-08-22 DIAGNOSIS — H35423 Microcystoid degeneration of retina, bilateral: Secondary | ICD-10-CM | POA: Diagnosis not present

## 2019-08-22 DIAGNOSIS — H353221 Exudative age-related macular degeneration, left eye, with active choroidal neovascularization: Secondary | ICD-10-CM | POA: Diagnosis not present

## 2019-08-22 DIAGNOSIS — H35033 Hypertensive retinopathy, bilateral: Secondary | ICD-10-CM | POA: Diagnosis not present

## 2019-08-22 DIAGNOSIS — H353114 Nonexudative age-related macular degeneration, right eye, advanced atrophic with subfoveal involvement: Secondary | ICD-10-CM | POA: Diagnosis not present

## 2019-08-22 NOTE — Telephone Encounter (Signed)
I called pt. I advised pt that Dr. Dohmeier reviewed their sleep study results and found that has severe sleep apnea and recommends that pt be treated with a cpap. Dr. Dohmeier recommends that pt return for a repeat sleep study in order to properly titrate the cpap and ensure a good mask fit. Pt is agreeable to returning for a titration study. I advised pt that our sleep lab will file with pt's insurance and call pt to schedule the sleep study when we hear back from the pt's insurance regarding coverage of this sleep study. Pt verbalized understanding of results. Pt had no questions at this time but was encouraged to call back if questions arise.  

## 2019-08-22 NOTE — Telephone Encounter (Signed)
-----   Message from Larey Seat, MD sent at 08/21/2019  4:50 PM EDT ----- IMPRESSION:   1. Severe Obstructive Sleep Apnea (OSA) - patient has  difficulties with CPAP as her arm cannot be raised and she needed  to be fitted for an easy fit mask.  2. Severe hypoxemia, critical low level for a long time,  confirming oxygen need. O2 desaturations were worsening in deeper  sleep stages. The patient is clearly oxygen dependent!    RECOMMENDATIONS:   1. URGENTLY- Advise full-night, attended, Oxygen and PAP  titration study to optimize therapy. the patient's oxygen  dependence is not in question, we need to see if we can find a  PAP therapy and interface for her (!), she needs to be on 02.

## 2019-09-10 ENCOUNTER — Ambulatory Visit (INDEPENDENT_AMBULATORY_CARE_PROVIDER_SITE_OTHER): Payer: Medicare Other | Admitting: Neurology

## 2019-09-10 DIAGNOSIS — G4736 Sleep related hypoventilation in conditions classified elsewhere: Secondary | ICD-10-CM

## 2019-09-10 DIAGNOSIS — M7501 Adhesive capsulitis of right shoulder: Secondary | ICD-10-CM

## 2019-09-10 DIAGNOSIS — R0902 Hypoxemia: Secondary | ICD-10-CM

## 2019-09-10 DIAGNOSIS — J449 Chronic obstructive pulmonary disease, unspecified: Secondary | ICD-10-CM

## 2019-09-10 DIAGNOSIS — G4733 Obstructive sleep apnea (adult) (pediatric): Secondary | ICD-10-CM

## 2019-09-10 DIAGNOSIS — I5032 Chronic diastolic (congestive) heart failure: Secondary | ICD-10-CM

## 2019-09-18 NOTE — Addendum Note (Signed)
Addended by: Larey Seat on: 09/18/2019 05:30 PM   Modules accepted: Orders

## 2019-09-18 NOTE — Addendum Note (Signed)
Addended by: Larey Seat on: 09/18/2019 05:31 PM   Modules accepted: Orders

## 2019-09-18 NOTE — Procedures (Signed)
PATIENT'S NAME:  Lisa Mcclure, Lisa Mcclure DOB:      Apr 04, 1933      MR#:    [id number]     DATE OF RECORDING: 09/10/2019 [facility] REFERRING M.D.:  [name], MD/ Dr Einar Gip, MD  Study Performed:   CPAP  Titration HISTORY:  This reportedly oxygen dependent patient is returning to sleep lab for CPAP titration following PSG from 08/13/19 which resulted in an AHI of 48.9/h and nadir spo2 of 58%. Severe apnea, cyclic breathing pattern, and severe, prolonged hypoxemia with a total desaturation time of 231 minutes (!)  The patient cannot raise her arms above shoulder level and was to be fitted for a mask allowing her easy use with this specific handicap. Also instructed to use oxygen if needed. Change modalities if needed.  The patient endorsed the Epworth Sleepiness Scale at 11 points.   The patient's weight 220 pounds with a height of 64 (inches), resulting in a BMI of 37.6 kg/m2. The patient's neck circumference measured 16.5 inches.  CURRENT MEDICATIONS: Norvasc, Nexilzet, Vitamin D3, Flonase, Lasix, Apresoline, Norco, Anusol, Xalatan, Synthroid, Cozaar, Prilosec, Requip.   PROCEDURE:  This is a multichannel digital polysomnogram utilizing the SomnoStar 11.2 system.  Electrodes and sensors were applied and monitored per AASM Specifications.   EEG, EOG, Chin and Limb EMG, were sampled at 200 Hz.  ECG, Snore and Nasal Pressure, Thermal Airflow, Respiratory Effort, CPAP Flow and Pressure, Oximetry was sampled at 50 Hz. Digital video and audio were recorded.      The patient was fitted with a ResMed N30 nasal cradle in small size .  CPAP was initiated at 5 cmH20 with heated humidity per AASM split night standards and pressure was advanced to 9 cmH20 because of hypopneas, apneas and desaturations. This was a compromise, as higher pressures were leading to complaints of choking or gagging.  The technologist tried 10 and 11 cm water pressure but the patient couldn't tolerate these settings. BiPAP at 11/7 cm was attempted  and abolished after 3 minutes of sleep. At a PAP pressure of 9 cmH20, there was a reduction of the AHI to 1.4/h with improvement of sleep apnea and Spo2 stayed at and above 88%.  Lights Out was at 22:55 and Lights On at 05:10. Total recording time (TRT) was 375 minutes, with a total sleep time (TST) of 263.5 minutes. The patient's sleep latency was 18 minutes. REM latency was 5.5 minutes.  The sleep efficiency was 70.3 %.    SLEEP ARCHITECTURE: WASO (Wake after sleep onset) was 97.5 minutes.  There were 30.5 minutes in Stage N1, 59 minutes Stage N2, 77.5 minutes Stage N3 and 96.5 minutes in Stage REM.  The percentage of Stage N1 was 11.6%, Stage N2 was 22.4%, Stage N3 was 29.4% and Stage R (REM sleep) was 36.6%.   RESPIRATORY ANALYSIS:  There was a total of 17 respiratory events: 0 obstructive apneas, 3 central apneas and 0 mixed apneas with a total of 3 apneas and an apnea index (AI) of .7 /hour. There were 14 hypopneas with a hypopnea index of 3.2/hour. The patient also had 0 respiratory event related arousals (RERAs).      The total APNEA/HYPOPNEA INDEX  (AHI) was 3.9 /hour and the total RESPIRATORY DISTURBANCE INDEX was 3.9 /hour  9 events occurred in REM sleep and 8 events in NREM. The REM AHI was 5.6 /hour versus a non-REM AHI of 2.9 /hour.  The patient spent 38.5 minutes of total sleep time in the supine position and  225 minutes in non-supine. The supine AHI was 9.4, versus a non-supine AHI of 2.9.  OXYGEN SATURATION & C02:  The baseline 02 saturation was 90%, with the lowest being 71%. Time spent below 89% saturation equaled 56 minutes.  The arousals were noted as: 63 were spontaneous, 0 were associated with PLMs, and only 3 were associated with respiratory events. The patient had a total of 0 Periodic Limb Movements.  Audio and video analysis did not show any abnormal or unusual movements, behaviors, phonations or vocalizations.  The patient took 1 bathroom break. Snoring was noted. EKG was in  keeping with normal sinus rhythm (NSR) with PACs. The patient was fitted with a ResMed AirFit N30 small cradle mask in spite of oral air ventilation- she declined FFM.  DIAGNOSIS : Very high level of anxiety. 1. Complex Sleep Apnea responded to 9 cm water pressure  2. Sleep Related Hypoxemia improved but was not alleviated - nadir was 88% , total desaturation time speaks for continued need of oxygen  3. Snoring/ Upper Airway Resistance Syndrome improved.   PLANS/RECOMMENDATIONS:  Autotitration capable CPAP device , ResMed S11 with a setting of 5-12 cm water and 2 cm EPR. I recommend to add 2 liters of oxygen and bleed these into machine.  1. Any apnea patient should avoid sedatives, hypnotics, and alcohol consumption. 2. CPAP therapy compliance is defined as 4 hours or more of nightly use   A follow up appointment will be scheduled with our NP in the Sleep Clinic at [facility].   Please call [phone] with any questions.      I certify that I have reviewed the entire raw data recording prior to the issuance of this report in accordance with the Standards of Accreditation of the American Academy of Sleep Medicine (AASM)  Larey Seat, M.D. Diplomat, Tax adviser of Neurology  Diplomat, Tax adviser of Sleep Medicine Market researcher, [facility] [employer]

## 2019-09-18 NOTE — Progress Notes (Signed)
Primary Physician/Referring:  [name], MD  [name], female    DOB: Nov 13, 1932, 86 y.o.   MRN: [id number]  Chief Complaint  Patient presents with  . Follow-up    6 month  . Hypertension  . Hyperlipidemia  . AAA   HPI:    Lisa Mcclure  is a 86 y.o.  female  with past medical history of obesity, chronic diastolic CHF, hypertensive heart disease, HTN, anemia, hemorrhoids, depression, diverticulosis, esophageal reflux, OSA intolerant to CPAP on nocturnal 02, rheumatoid arthritis, and venous insufficiency. CT scan of the chest 03/2016 without definitive ILD. LM and 2 vessel CAD, mild calcification on mitral and aortic valve. Also has  moderate sized AAA.   She has been intolerant to several statins in the past, I had started her on Nexlezet which she is tolerating.  States BP is well controlled. Weight loss has been difficult. Except for chronic dyspnea, no other specific complaints.   Past Medical History:  Diagnosis Date  . Anemia    h/o hemorrhoidal bleeding and blood transfusion  . Cardiomegaly   . Chronic diastolic CHF (congestive heart failure) ([place])   . Depression   . Diverticulosis   . DOE (dyspnea on exertion)   . Esophageal reflux   . GERD (gastroesophageal reflux disease)   . Hypothyroidism   . Insomnia   . LBP (low back pain)   . OAB (overactive bladder)   . Obesity   . OSA (obstructive sleep apnea)   . Osteoarthritis   . Rheumatoid arthritis(714.0)   . Shoulder pain, bilateral   . Unspecified essential hypertension   . Venous insufficiency    Past Surgical History:  Procedure Laterality Date  . APPENDECTOMY  1953  . bladder abduction-1996  1996  . breast biopsy Right 1980  . [place]  . COSMETIC SURGERY  1996  . CYSTOCELE REPAIR    . FLEXIBLE SIGMOIDOSCOPY N/A 04/10/2015   Procedure: FLEXIBLE SIGMOIDOSCOPY;  Surgeon: Arta Silence, MD;  Location: [facility]COPY;  Service: Endoscopy;   Laterality: N/A;  . knee arthroscopy Right 1996, 2010  . KNEE ARTHROSCOPY W/ AUTOGENOUS CARTILAGE IMPLANTATION (ACI) PROCEDURE Left 1994, 1995  . SHOULDER SURGERY  1990  . TOTAL ABDOMINAL HYSTERECTOMY  1972  . VESICOVAGINAL FISTULA CLOSURE W/ TAH    . WRIST SURGERY  1967   Family History  Problem Relation Age of Onset  . COPD Father   . Lung cancer Mother   . Heart attack Maternal Grandmother 80  . Breast cancer Maternal Aunt   . Lung cancer Son     Social History   Tobacco Use  . Smoking status: Former Smoker    Packs/day: 0.20    Years: 10.00    Pack years: 2.00    Types: Cigarettes    Quit date: 04/11/1984    Years since quitting: 35.4  . Smokeless tobacco: Never Used  Substance Use Topics  . Alcohol use: Yes    Alcohol/week: 0.0 standard drinks    Comment: cocktail once a week   Marital Status: Widowed  ROS  Review of Systems  Cardiovascular: Positive for dyspnea on exertion. Negative for leg swelling and syncope.  Musculoskeletal: Positive for back pain and joint pain (bilateral knees).  Gastrointestinal: Negative for melena.   Objective  Blood pressure (!) 93/55, pulse 72, resp. rate 16, height 5\' 3"  (1.6 m), weight 219 lb (99.3 kg), SpO2 97 %.  Vitals with BMI 09/19/2019 07/30/2019  07/29/2019  Height 5\' 3"  5' 3.5" 5' 3.5"  Weight 219 lbs 215 lbs 221 lbs  BMI 38.8 73.56 70.14  Systolic 93 103 013  Diastolic 55 84 73  Pulse 72 62 66     Physical Exam Vitals reviewed.  Constitutional:      General: She is not in acute distress.    Appearance: She is well-developed.  Cardiovascular:     Rate and Rhythm: Normal rate and regular rhythm.     Pulses: Intact distal pulses.          Femoral pulses are 1+ on the right side and 1+ on the left side.      Popliteal pulses are 1+ on the right side and 1+ on the left side.       Dorsalis pedis pulses are 2+ on the right side and 2+ on the left side.       Posterior tibial pulses are 2+ on the right side and 2+ on the  left side.     Heart sounds: No murmur heard.  Harsh early systolic murmur is present at the upper right sternal border radiating to the apex.  No gallop.      Comments: No leg edema, no JVD.  Pulmonary:     Effort: Pulmonary effort is normal. No accessory muscle usage or respiratory distress.     Breath sounds: Normal breath sounds.  Abdominal:     General: Bowel sounds are normal.     Palpations: Abdomen is soft.  Neurological:     Mental Status: She is alert.    Laboratory examination:   No results for input(s): NA, K, CL, CO2, GLUCOSE, BUN, CREATININE, CALCIUM, GFRNONAA, GFRAA in the last 8760 hours. CrCl cannot be calculated (Patient's most recent lab result is older than the maximum 21 days allowed.).  CMP Latest Ref Rng & Units 09/17/2018 09/16/2018 09/15/2018  Glucose 70 - 99 mg/dL 84 88 131(H)  BUN 8 - 23 mg/dL 12 14 14   Creatinine 0.44 - 1.00 mg/dL 1.01(H) 0.78 0.81  Sodium 135 - 145 mmol/L 135 133(L) 129(L)  Potassium 3.5 - 5.1 mmol/L 4.4 4.4 4.6  Chloride 98 - 111 mmol/L 101 102 98  CO2 22 - 32 mmol/L 25 24 20(L)  Calcium 8.9 - 10.3 mg/dL 9.0 8.8(L) 8.6(L)  Total Protein 6.5 - 8.1 g/dL - - -  Total Bilirubin 0.3 - 1.2 mg/dL - - -  Alkaline Phos 38 - 126 U/L - - -  AST 15 - 41 U/L - - -  ALT 0 - 44 U/L - - -   CBC Latest Ref Rng & Units 09/14/2018 09/13/2018 01/28/2016  WBC 4.0 - 10.5 K/uL 8.3 7.3 9.5  Hemoglobin 12.0 - 15.0 g/dL 12.2 11.7(L) 12.1  Hematocrit 36 - 46 % 34.4(L) 32.7(L) 37.0  Platelets 150 - 400 K/uL 297 272 335   Lipid Panel    Component Value Date/Time   CHOL 126 03/14/2019 1121   TRIG 82 03/14/2019 1121   HDL 49 03/14/2019 1121   CHOLHDL 5.1 CALC 12/19/2006 0916   VLDL 26 12/19/2006 0916   LDLCALC 61 03/14/2019 1121   LDLDIRECT 159.2 12/19/2006 0916    HEMOGLOBIN A1C No results found for: HGBA1C, MPG TSH No results for input(s): TSH in the last 8760 hours.  External labs:   Cholesterol, total 164.000 m 06/21/2019 Triglycerides 93.000  06/21/2019 HDL 46 MG/DL 06/21/2019 LDL 99.000 mg 06/21/2019  Glucose Random 87.000 mg 06/21/2019 BUN 25.000 mg 06/21/2019 Creatinine, Serum 0.800  mg/ 06/21/2019  TSH 3.220 micr 06/21/2019  FOBT: Normal 04/24/2018  MicroAlbumin Urine 13.000 03/23/2017 MicroAlbumin/Creat 37.9 MG/ 03/23/2017  Medications and allergies   Allergies  Allergen Reactions  . Statins Other (See Comments)    Muscle aches and INTERNAL BLEEDING  . Gabapentin Other (See Comments)    "Made me loopy"  . Methocarbamol Other (See Comments)    "Made me loopy"     Current Outpatient Medications  Medication Instructions  . aspirin EC 81 mg, Oral, Daily  . Bempedoic Acid-Ezetimibe (NEXLIZET) 180-10 MG TABS 1 capsule, Oral, Daily  . carvedilol (COREG) 12.5 mg, Oral, 2 times daily  . diclofenac sodium (VOLTAREN) 2.25 g, Topical, 3 times daily PRN  . fluticasone (FLONASE) 50 MCG/ACT nasal spray 2 sprays, Each Nare, Daily PRN  . furosemide (LASIX) 20 mg, Oral, Daily  . hydrALAZINE (APRESOLINE) 25 MG tablet TAKE 1 TABLET 3 TIMES A DAY  . HYDROcodone-acetaminophen (NORCO) 7.5-325 MG tablet 1 tablet, Oral, Every 6 hours PRN  . isosorbide dinitrate (ISORDIL) 30 mg, Oral, 3 times daily  . latanoprost (XALATAN) 0.005 % ophthalmic solution 1 drop, Both Eyes, Daily at bedtime  . levothyroxine (SYNTHROID) 100 mcg, Oral, Daily before breakfast  . losartan (COZAAR) 100 mg, Oral, 2 times daily  . omeprazole (PRILOSEC) 20 mg, Oral, 2 times daily before meals  . rOPINIRole (REQUIP) 1 mg, Oral, See admin instructions, Take 1 mg by mouth once a day between 3 PM-4 PM and an additional 1 mg in the evening, if no relief  . Vitamin D3 5,000 Units, Oral, Daily with breakfast   Medications Discontinued During This Encounter  Medication Reason  . hydrocortisone (ANUSOL-HC) 2.5 % rectal cream No longer needed (for PRN medications)  . Multiple Vitamins-Minerals (ICAPS AREDS 2 PO) Patient Preference  . amLODipine (NORVASC) 10 MG tablet Patient  has not taken in last 30 days  . carvedilol (COREG) 25 MG tablet Reorder    Radiology:   No results found.  Cardiac Studies:   Lexiscan myoview stress test 12/08/2017: 1. Lexiscan stress test was performed. Exercise capacity was not assessed. Stress symptoms included abdominal pain. Blood pressure was normal. The resting and stress electrocardiogram demonstrated normal sinus rhythm, normal resting conduction, no resting arrhythmias and normal rest repolarization. 2. The overall quality of the study is good. There is no evidence of abnormal lung activity. Stress and rest SPECT images demonstrate homogeneous tracer distribution throughout the myocardium. Gated SPECT imaging reveals normal myocardial thickening and wall motion. The left ventricular ejection fraction was normal (54%). 3. Low risk study  Renal artery duplex 09/27/2017: Hemodynamically significant stenosis bilaterally. Renal length is within normal limits for both kidneys. There is an incidental 3.9x3.4x3.4 cm proximal and mid abdominal aortic aneurysm. Recommend dedicated abdominal aortic duplex scan to further define AAA.  Abdominal Aortic Duplex  01/01/2019: Moderate dilatation of the abdominal aorta is noted in the proximal, mid and distal aorta. An abdominal aortic aneurysm measuring 4.5 x 4.52 x 4.52 cm is seen.  Recheck in 6 months for stability of the aneurysm.  Echocardiogram 01/25/2019: Left ventricle cavity is normal in size. Severe concentric hypertrophy of the left ventricle. Normal LV systolic function with visual EF 50-55%. Normal global wall motion. Doppler evidence of grade I (impaired) diastolic dysfunction, normal LAP. Calculated EF 47%. Left atrial cavity is mildly dilated. Trileaflet aortic valve with mild calcification of the aortic valve annulus and leaflets.  Mild aortic valve stenosis. Aortic valve mean gradient of 12 mmHg, Vmax of 2.3  m/s.  Calculated aortic valve area by continuity equation is 1.3 cm.  Trace aortic regurgitation. Mild (Grade I) mitral regurgitation. Mild tricuspid regurgitation. Estimated pulmonary artery systolic pressure is 25 mmHg. No significant change compared to previous study on 09/27/2017.   EKG  01/08/2019: Normal sinus rhythm at 63 bpm with first degree AV block, normal axis, no evidence of ischemia.   Assessment     ICD-10-CM   1. Primary hypertension  I10 carvedilol (COREG) 12.5 MG tablet    CANCELED: EKG 12-Lead  2. Chronic diastolic (congestive) heart failure (HCC)  I50.32   3. Hyperlipidemia, group A  E78.00   4. AAA (abdominal aortic aneurysm) without rupture (HCC)  I71.4      Recommendations:   CHINMAYI RUMER  is a 86 y.o.  female  with past medical history of obesity, chronic diastolic CHF, hypertensive heart disease, HTN, anemia, hemorrhoids, depression, diverticulosis, esophageal reflux, OSA intolerant to CPAP on nocturnal 02, rheumatoid arthritis, and venous insufficiency. CT scan of the chest 03/2016 without definitive ILD. LM and 2 vessel CAD, mild calcification on mitral and aortic valve. Also has  moderate sized AAA.   She presents for 68-month office visit, since being on Nexium that, she has had excellent control of lipids. Blood pressure on present medical regimen is well controlled. She is having a hard time trying to lose weight.  With regard to diastolic heart failure, no further leg edema, no PND or orthopnea. Blood pressure is also well controlled and since she has been on isosorbide dinitrate and hydralazine, blood pressure have been very low at times even at home fortunately she has not had any dizziness or syncope. I will reduce the dose of carvedilol from 25 mg twice daily to 12.5 mg p.o. twice daily. About 3 months ago she has discontinued amlodipine.  With regard to obesity, I discussed dietary plans with the patient and gave her ideas about weight loss.   She has moderate sized AAA, needs surveillance duplex which will perform  in next 5 to 6 months and I would like to see her back then.  I have reviewed her external labs, updated the labs. Renal function is stable, lipids well controlled.  Adrian Prows, MD, [facility] 09/19/2019, 3:18 PM [facility] [place] Pager: [phone] Office: [phone]

## 2019-09-18 NOTE — Progress Notes (Signed)
Autotitration capable CPAP device ,  ResMed S11 with a setting of 5-12 cm water and 2 cm EPR. I  recommend to add 2 liters of oxygen and bleed these into machine.   Very high level of anxiety. Please consider treatment.

## 2019-09-19 ENCOUNTER — Other Ambulatory Visit: Payer: Self-pay

## 2019-09-19 ENCOUNTER — Encounter: Payer: Self-pay | Admitting: Cardiology

## 2019-09-19 ENCOUNTER — Ambulatory Visit: Payer: Medicare Other | Admitting: Cardiology

## 2019-09-19 ENCOUNTER — Telehealth: Payer: Self-pay

## 2019-09-19 VITALS — BP 93/55 | HR 72 | Resp 16 | Ht 63.0 in | Wt 219.0 lb

## 2019-09-19 DIAGNOSIS — I714 Abdominal aortic aneurysm, without rupture, unspecified: Secondary | ICD-10-CM

## 2019-09-19 DIAGNOSIS — I10 Essential (primary) hypertension: Secondary | ICD-10-CM

## 2019-09-19 DIAGNOSIS — I5032 Chronic diastolic (congestive) heart failure: Secondary | ICD-10-CM

## 2019-09-19 DIAGNOSIS — E7800 Pure hypercholesterolemia, unspecified: Secondary | ICD-10-CM | POA: Diagnosis not present

## 2019-09-19 MED ORDER — CARVEDILOL 12.5 MG PO TABS: 12.5000 mg | ORAL_TABLET | Freq: Two times a day (BID) | ORAL | 3 refills | Status: DC

## 2019-09-19 NOTE — Telephone Encounter (Signed)
-----   Message from Larey Seat, MD sent at 09/18/2019  5:30 PM EDT ----- DIAGNOSIS :  1. Complex Sleep Apnea responded to 9 cm water pressure  2. Sleep Related Hypoxemia improved but was not alleviated -  nadir was 88% , total desaturation time speaks for continued need  of oxygen  3. Snoring/ Upper Airway Resistance Syndrome improved.    PLANS/RECOMMENDATIONS: Autotitration capable CPAP device ,  ResMed S11 with a setting of 5-12 cm water and 2 cm EPR. I  recommend to add 2 liters of oxygen and bleed these into machine.  Very high level of anxiety. Please consider treatment.  1. Any apnea patient should avoid sedatives, hypnotics, and  alcohol consumption.  2. CPAP therapy compliance is defined as 4 hours or more of  nightly use

## 2019-09-19 NOTE — Telephone Encounter (Signed)
I called pt. I advised pt that Dr. Brett Fairy  reviewed their sleep study results and found the pt did well during the titration study with cpap. Dr. Brett Fairy recommends that pt start an autopap for treatment. I reviewed PAP compliance expectations with the pt. Pt is agreeable to starting an auto-PAP. I advised pt that an order will be sent to a DME, [facility], and [facility] will call the pt within about one week after they file with the pt's insurance. [facility] will show the pt how to use the machine, fit for masks, and troubleshoot the auto-PAP if needed. A follow up appt was made for insurance purposes with Dr. Brett Fairy  on 11/26/2019 at 230. Pt verbalized understanding to arrive 15 minutes early and bring their auto-PAP. A letter with all of this information in it will be mailed to the pt as a reminder. I verified with the pt that the address we have on file is correct. Pt verbalized understanding of results. Pt had no questions at this time but was encouraged to call back if questions arise. I have sent the order to [facility] and have received confirmation that they have received the order.

## 2019-09-23 ENCOUNTER — Telehealth: Payer: Self-pay

## 2019-09-23 DIAGNOSIS — H353221 Exudative age-related macular degeneration, left eye, with active choroidal neovascularization: Secondary | ICD-10-CM | POA: Diagnosis not present

## 2019-09-23 DIAGNOSIS — H35033 Hypertensive retinopathy, bilateral: Secondary | ICD-10-CM | POA: Diagnosis not present

## 2019-09-23 DIAGNOSIS — H353114 Nonexudative age-related macular degeneration, right eye, advanced atrophic with subfoveal involvement: Secondary | ICD-10-CM | POA: Diagnosis not present

## 2019-09-23 DIAGNOSIS — H35423 Microcystoid degeneration of retina, bilateral: Secondary | ICD-10-CM | POA: Diagnosis not present

## 2019-09-23 NOTE — Telephone Encounter (Signed)
Ask her to continue it. I updated her chart

## 2019-09-23 NOTE — Telephone Encounter (Signed)
Patient on a recent office visit had mentioned she was not on amlodipine, we had called the pharmacy and they had also confirmed that last pickup was 3 months ago.  But patient states she is presently taking 10 mg of amlodipine.  I have updated her medication list.

## 2019-09-23 NOTE — Telephone Encounter (Signed)
Patient saw you on 09/19/19, she told you she was not taking Amlodipine. But when she get home she realizes she is taking 10 mg dailiy.

## 2019-09-26 ENCOUNTER — Encounter: Payer: Self-pay | Admitting: Physical Medicine and Rehabilitation

## 2019-09-26 DIAGNOSIS — R269 Unspecified abnormalities of gait and mobility: Secondary | ICD-10-CM | POA: Diagnosis not present

## 2019-09-26 DIAGNOSIS — G8929 Other chronic pain: Secondary | ICD-10-CM | POA: Diagnosis not present

## 2019-09-26 DIAGNOSIS — E785 Hyperlipidemia, unspecified: Secondary | ICD-10-CM | POA: Diagnosis not present

## 2019-09-26 DIAGNOSIS — M545 Low back pain: Secondary | ICD-10-CM | POA: Diagnosis not present

## 2019-09-26 DIAGNOSIS — I350 Nonrheumatic aortic (valve) stenosis: Secondary | ICD-10-CM | POA: Diagnosis not present

## 2019-09-26 DIAGNOSIS — E039 Hypothyroidism, unspecified: Secondary | ICD-10-CM | POA: Diagnosis not present

## 2019-09-26 DIAGNOSIS — G4733 Obstructive sleep apnea (adult) (pediatric): Secondary | ICD-10-CM | POA: Diagnosis not present

## 2019-09-26 DIAGNOSIS — I110 Hypertensive heart disease with heart failure: Secondary | ICD-10-CM | POA: Diagnosis not present

## 2019-09-26 DIAGNOSIS — I5032 Chronic diastolic (congestive) heart failure: Secondary | ICD-10-CM | POA: Diagnosis not present

## 2019-09-26 MED ORDER — BUPIVACAINE HCL 0.5 % IJ SOLN
3.0000 mL | INTRAMUSCULAR | Status: AC | PRN
  Administered 2019-07-30: 3 mL via INTRA_ARTICULAR

## 2019-09-26 MED ORDER — TRIAMCINOLONE ACETONIDE 40 MG/ML IJ SUSP
60.0000 mg | INTRAMUSCULAR | Status: AC | PRN
  Administered 2019-07-30: 60 mg via INTRA_ARTICULAR

## 2019-10-09 ENCOUNTER — Telehealth: Payer: Self-pay

## 2019-10-09 NOTE — Telephone Encounter (Signed)
Voicemail message, 10:38am:  Patient only left her name and phone number and asked for a call back. [phone].

## 2019-10-10 NOTE — Telephone Encounter (Signed)
Called the patient. Pt states that she was just set up with [facility] on 6/29 and we would need to push the initial Pap apt out. Pt can be scheduled 7/30-9/29/2021 Pt was already scheduled for 8/17 which falls within the window. Will keep apt as is. Pt verbalized understanding.

## 2019-10-15 ENCOUNTER — Telehealth: Payer: Self-pay | Admitting: Neurology

## 2019-10-15 NOTE — Telephone Encounter (Signed)
Spoke with Dr Brett Fairy and she agrees with having the patient reach out to PCP to rule out cold. She states that one thing we could also try is lowering the humidification on the machine and having the company work with the patient on trying a different type of mask.  I will reach out and discuss with the patient.

## 2019-10-15 NOTE — Telephone Encounter (Signed)
Pt has called to report she is having difficulty with CPAP, only able to use it 1 night. Pt has SOB runny nose, feels like pneumonia

## 2019-10-15 NOTE — Telephone Encounter (Signed)
Called the patient back. She was set up 10/08/19 with the CPAP machine. The first night felt it did ok and had no concerns. The second night she struggled with it and has every night. She states that she had horrible congestion and nasal drip. She is beginning to feel awful and is not dealing well with using it. She states that this morning she is even having shortness of breath and congested (she admits to having PNA 4 times in past) but states she didn't use the CPAP  Machine last night.  Advised the patient that it sounds to me as if she has developed a head cold. Informed her that this would not be from the CPAP because she just started it. Informed her that when we have pt's that complain of feeling congested we advise to use Saline nasal spray to help open the air way. She states that she has used Flonase. Patient asked, "You don't this is an allergy" Advised the patient that it could be allergies acting up but that I would not think it is from the CPAP. Advised the CPAP provides a pressure (not medication) advised the mask should be hypo allergenic material so I would not relate the two together. Informed her that I think it could be coincidence that they happened/occurred at the same time but that I would not associate with the machine. Patient states that she is using the mask that we ordered.  She used in titration study RESMED airfit N30 small cradle (refused FFM) I told the patient I would bring that to Dr Dohmeier atn. Advised that in regards to the fact she didn't use her CPAP last night and she woke up feeling hard to breathe and short of breath, I advised we typically would associate that with untreated sleep apnea but with the congestion and nasal drainage I would encourage her to reach out to her PCP to discuss if she needs an assessment or medications to help her.  In meantime the patient wanted me to bring to Dr Dohmeier's atn that she is not dealing well with the machine and would not like to  continue you with using it. I will contact the patient with Dr Dohmeier recommendations.

## 2019-10-16 NOTE — Telephone Encounter (Signed)
Called the patient and there was no answer. LVM advising that Dr Brett Fairy recommends following up with pcp in regards to the symptoms that she is having. She does mention that potentially lowering the humidification and changing the mask may be helpful. Advised on VM I would send an order to [facility] asking them to help with these things. Advised that patient has a follow up visit 8/17 and we will also reassess if need to continue or stop. Informed the patient to call me back with any questions.

## 2019-10-16 NOTE — Telephone Encounter (Signed)
Pt called back to speak to nurse.

## 2019-10-17 ENCOUNTER — Other Ambulatory Visit: Payer: Self-pay | Admitting: Neurology

## 2019-10-17 DIAGNOSIS — G4733 Obstructive sleep apnea (adult) (pediatric): Secondary | ICD-10-CM

## 2019-10-17 NOTE — Telephone Encounter (Signed)
Called the pt and she states that on 7/6 she did not use the machine that night and felt better on 7/7. States that she used the machine last night and has sneezed all day. Informed her at this point I have sent orders to [facility] to see if they can work with her some and try to help her become compliant. Advised we keep the upcoming apt for 8/17 and we can then discuss with Dr. Brett Fairy if its work continuing or not. Pt verbalized understanding.

## 2019-11-04 ENCOUNTER — Telehealth: Payer: Self-pay | Admitting: Physical Medicine and Rehabilitation

## 2019-11-04 NOTE — Telephone Encounter (Signed)
Left message #1

## 2019-11-04 NOTE — Telephone Encounter (Signed)
Patient called. She would like to schedule an appointment with Dr. Ernestina Patches. Her call back number is [phone]

## 2019-11-04 NOTE — Telephone Encounter (Signed)
ok 

## 2019-11-04 NOTE — Telephone Encounter (Signed)
Patient wantsrepeat right shoulder injection. Last was 07/30/19. [place] for repeat?

## 2019-11-05 ENCOUNTER — Telehealth: Payer: Self-pay | Admitting: Physical Medicine and Rehabilitation

## 2019-11-05 NOTE — Telephone Encounter (Signed)
Patient returned call to [facility]. Patient states to please call back right away. She states she is leaving out at 11:30 am

## 2019-11-05 NOTE — Telephone Encounter (Signed)
Scheduled for 8/5

## 2019-11-06 DIAGNOSIS — E038 Other specified hypothyroidism: Secondary | ICD-10-CM | POA: Diagnosis not present

## 2019-11-07 DIAGNOSIS — H353221 Exudative age-related macular degeneration, left eye, with active choroidal neovascularization: Secondary | ICD-10-CM | POA: Diagnosis not present

## 2019-11-07 DIAGNOSIS — H43813 Vitreous degeneration, bilateral: Secondary | ICD-10-CM | POA: Diagnosis not present

## 2019-11-07 DIAGNOSIS — H353114 Nonexudative age-related macular degeneration, right eye, advanced atrophic with subfoveal involvement: Secondary | ICD-10-CM | POA: Diagnosis not present

## 2019-11-07 DIAGNOSIS — H35033 Hypertensive retinopathy, bilateral: Secondary | ICD-10-CM | POA: Diagnosis not present

## 2019-11-12 DIAGNOSIS — E039 Hypothyroidism, unspecified: Secondary | ICD-10-CM | POA: Diagnosis not present

## 2019-11-12 DIAGNOSIS — I10 Essential (primary) hypertension: Secondary | ICD-10-CM | POA: Diagnosis not present

## 2019-11-12 DIAGNOSIS — G8929 Other chronic pain: Secondary | ICD-10-CM | POA: Diagnosis not present

## 2019-11-12 DIAGNOSIS — M25512 Pain in left shoulder: Secondary | ICD-10-CM | POA: Diagnosis not present

## 2019-11-12 DIAGNOSIS — G4733 Obstructive sleep apnea (adult) (pediatric): Secondary | ICD-10-CM | POA: Diagnosis not present

## 2019-11-12 DIAGNOSIS — I5032 Chronic diastolic (congestive) heart failure: Secondary | ICD-10-CM | POA: Diagnosis not present

## 2019-11-12 DIAGNOSIS — G2581 Restless legs syndrome: Secondary | ICD-10-CM | POA: Diagnosis not present

## 2019-11-12 DIAGNOSIS — E785 Hyperlipidemia, unspecified: Secondary | ICD-10-CM | POA: Diagnosis not present

## 2019-11-12 DIAGNOSIS — R32 Unspecified urinary incontinence: Secondary | ICD-10-CM | POA: Diagnosis not present

## 2019-11-14 ENCOUNTER — Ambulatory Visit (INDEPENDENT_AMBULATORY_CARE_PROVIDER_SITE_OTHER): Payer: Medicare Other | Admitting: Physical Medicine and Rehabilitation

## 2019-11-14 ENCOUNTER — Encounter: Payer: Self-pay | Admitting: Physical Medicine and Rehabilitation

## 2019-11-14 ENCOUNTER — Other Ambulatory Visit: Payer: Self-pay

## 2019-11-14 ENCOUNTER — Ambulatory Visit: Payer: Self-pay

## 2019-11-14 VITALS — BP 197/103 | HR 71

## 2019-11-14 DIAGNOSIS — M19011 Primary osteoarthritis, right shoulder: Secondary | ICD-10-CM

## 2019-11-14 DIAGNOSIS — M25511 Pain in right shoulder: Secondary | ICD-10-CM | POA: Diagnosis not present

## 2019-11-14 DIAGNOSIS — G8929 Other chronic pain: Secondary | ICD-10-CM

## 2019-11-14 NOTE — Progress Notes (Signed)
Pt states right shoulder pain. Pt states her shoulder are fused. Pt states hot shower, heat pads and meds. Pt has hx of inj on 07/30/19 pt states that it helped a lot. Pt states the lasted couple weeks she had been feeling the pain.   Numeric Pain Rating Scale and Functional Assessment Average Pain 7   In the last MONTH (on 0-10 scale) has pain interfered with the following?  1. General activity like being  able to carry out your everyday physical activities such as walking, climbing stairs, carrying groceries, or moving a chair?  Rating(10)   +Driver, -BT, -Dye Allergies.

## 2019-11-15 DIAGNOSIS — H26492 Other secondary cataract, left eye: Secondary | ICD-10-CM | POA: Diagnosis not present

## 2019-11-26 ENCOUNTER — Encounter: Payer: Self-pay | Admitting: Neurology

## 2019-11-26 ENCOUNTER — Ambulatory Visit (INDEPENDENT_AMBULATORY_CARE_PROVIDER_SITE_OTHER): Payer: Medicare Other | Admitting: Neurology

## 2019-11-26 VITALS — BP 153/87 | HR 75 | Ht 63.5 in | Wt 207.0 lb

## 2019-11-26 DIAGNOSIS — G4733 Obstructive sleep apnea (adult) (pediatric): Secondary | ICD-10-CM | POA: Diagnosis not present

## 2019-11-26 DIAGNOSIS — Z789 Other specified health status: Secondary | ICD-10-CM | POA: Insufficient documentation

## 2019-11-26 DIAGNOSIS — R0902 Hypoxemia: Secondary | ICD-10-CM | POA: Diagnosis not present

## 2019-11-26 DIAGNOSIS — I5032 Chronic diastolic (congestive) heart failure: Secondary | ICD-10-CM | POA: Diagnosis not present

## 2019-11-26 NOTE — Progress Notes (Signed)
SLEEP MEDICINE CLINIC    Provider:  Larey Seat, MD  Primary Care Physician:  [name], MD [place] [address]     Referring Provider: [name], [place] [address],  [place] [address]          Chief Complaint according to patient   Patient presents with:    . New Patient (Initial Visit)  pt alone, rm 10. presents today for initial CPAP visit. She has remained non- compliant and refused to continue to use the machine DME [facility]. She will need order to dc the CPAP.    HISTORY OF PRESENT ILLNESS:  Lisa Mcclure is an 86 year- old Caucasian female patient and seen upon referral on 11/26/2019 from Dr. Philip Aspen. She is here to follow-up on her to sleep studies.  She had a primary diagnosis of severe hypoxemia when she underwent a repeat sleep study on 13 Aug 2019.  Severe sleep apnea of obstructive origin was again found but the patient had difficulties with the CPAP as her arm cannot be raised and she needed to be fitted for a mask that allows her to use.  The critical low hypoxemia stayed for a long time and I considered the patient clearly oxygen dependent.  I asked for the patient to have a return PAP titration study to optimize therapy.   Her oxygen dependence is not in question we just need to see how much CPAP therapy could under circumstances replace oxygen if it is possible at all.  The patient carries a long history of cardiomegaly, chronic diastolic congestive heart failure, dyspnea on exertion GERD and esophageal reflux, hypothyroidism, overactive bladder and known OSA.  The patient's AHI was 48.9 respiratory disturbance index was 48.9; REM apnea was 69.2/h and she spent 231 minutes below 90% oxygen saturation.  This test was followed by June 1 with a CPAP titration she was fitted with a ResMed N 30 nasal cradle mask and small size CPAP was initiated at 5 cm and was advanced to 9 cm.  The patient did not tolerate CPAP well she felt she  was gagging and gasping and her higher pressures were not tolerated at all we attempted BiPAP which was only seen for about 3 minutes of sleep but did not allow a better resolution.  The baseline O2 saturation was not 90% but the time spent below 89% saturation was 256 minutes and a very time constraint sleep study.  So the diagnosis was complex sleep apnea she responded to 9 cm water pressure but could not tolerated well she will remain on 2 L of oxygen but can be bled into the machine and I am happy to prescribe any mask she feels comfortable with.  I urged shared my results with them and with dr. Einar Gip her cardiologist.  today i think that the patient has made 7 days of attempt in the last 30 days to use his cpap but she has not been able to comply.  minimum pressure was 5 maximum pressure 12 cmh2o with an epr of 1 cm her ahi was 13.2 which is a reduction but not significant enough.  these were all obstructive events.  and her medium pressure was at 95th percentile 7 cm water. She tried and experienced sinus pressure, post nasal drip, and gagging. She is unhappy with the results.   She remains on oxygen at night . Trudy at [facility] [place] -  Has urinary frequency - one time every hour - Dr  Paterson.  She can't self cath and is not a foley candidate- needs to consider implanted cath.       07-29-2019:  Chief concern according to patient : Lisa Mcclure has had a 2010 sleep study with Dr Halford Chessman and a 2016 visit with Dr. Rexene Alberts.   I have the pleasure of seeing Lisa Mcclure today, a right-handed White or Caucasian female with a known OSA- sleep disorder, had been diagnosed with severe OSA, couldn't tolerate CPAP nor dental device. She has a past medical history of Anemia, Cardiomegaly, Chronic diastolic CHF (congestive heart failure) ([place]), Depression, Diverticulosis, DOE (dyspnea on exertion), Esophageal reflux, GERD (gastroesophageal reflux disease), Hypothyroidism, Insomnia, LBP (low back pain), OAB  (overactive bladder), Obesity, OSA (obstructive sleep apnea), Osteoarthritis, Rheumatoid arthritis(714.0), Shoulder pain, bilateral, Unspecified essential hypertension, and Venous insufficiency.   Sleep relevant medical history: I have untreated apnea.   Social history:  Patient is  Widowed, and retired from a Theatre manager and lives in a household alone  .  Family status is widowed , with 3 adult children, many grandchildren.  She has a living brother in [place], her middle son passed of cancer.  Pets are present- a dog.  Tobacco use; former .  ETOH use rare ,  Caffeine intake in form of Coffee( 1 cup in AM) Soda( none) Tea ( none) or energy drinks. Regular exercise in form of walking .   Hobbies : [place], she misses the summer times in [place]. She lived in [place] state. Drove a motor home.     Sleep habits are as follows: The patient's dinner time is at 6 PM. The patient goes to bed at 11.30 PM and continues to sleep for immediately for many hours- she has frequent nocturia, wakes every 2 hours for bathroom breaks.   The preferred sleep position is laterally, with the support of 1 pillows.  Dreams are reportedly frequent/vivid.Marland Kitchen  6 AM is the usual rise time. The patient wakes up spontaneously at 4 AM , often due to right hip and right shoulder pain. She is always wet- enuresis.  She reports not feeling refreshed or restored in AM, with symptoms such as dry mouth , morning headaches , and residual fatigue.  Naps are taken infrequently, she would like to, but she can't.   Review of Systems: Out of a complete 14 system review, the patient complains of only the following symptoms, and all other reviewed systems are negative.:  Fatigue, sleepiness , snoring, fragmented sleep, early morning arousals due pain NOCTURIA.  Insomnia    How likely are you to doze in the following situations: 0 = not likely, 1 = slight chance, 2 = moderate chance, 3 = high chance   Sitting and  Reading? Watching Television? Sitting inactive in a public place (theater or meeting)? As a passenger in a car for an hour without a break? Lying down in the afternoon when circumstances permit? Sitting and talking to someone? Sitting quietly after lunch without alcohol? In a car, while stopped for a few minutes in traffic?   Total = 11/ 24 points   FSS endorsed at 40/ 63 points.   Social History   Socioeconomic History  . Marital status: Widowed    Spouse name: Not on file  . Number of children: 3  . Years of education: College  . Highest education level: Not on file  Occupational History  . Occupation: Retired  Tobacco Use  . Smoking status: Former Smoker    Packs/day: 0.20  Years: 10.00    Pack years: 2.00    Types: Cigarettes    Quit date: 04/11/1984    Years since quitting: 35.6  . Smokeless tobacco: Never Used  Vaping Use  . Vaping Use: Never used  Substance and Sexual Activity  . Alcohol use: Yes    Alcohol/week: 0.0 standard drinks    Comment: cocktail once a week  . Drug use: No  . Sexual activity: Not on file  Other Topics Concern  . 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 Difficulty of Paying Living Expenses:   Food Insecurity:   . Worried About Charity fundraiser in the Last Year:   . Arboriculturist in the Last Year:   Transportation Needs:   . Film/video editor (Medical):   Marland Kitchen Lack of Transportation (Non-Medical):   Physical Activity:   . Days of Exercise per Week:   . Minutes of Exercise per Session:   Stress:   . Feeling of Stress :   Social Connections:   . Frequency of Communication with Friends and Family:   . Frequency of Social Gatherings with Friends and Family:   .  Attends Religious Services:   . Active Member of Clubs or Organizations:   . Attends Archivist Meetings:   Marland Kitchen Marital Status:     Family History  Problem Relation Age of Onset  . COPD Father   . Lung cancer Mother   . Heart attack Maternal Grandmother 80  . Breast cancer Maternal Aunt   . Lung cancer Son     Past Medical History:  Diagnosis Date  . Anemia    h/o hemorrhoidal bleeding and blood transfusion  . Cardiomegaly   . Chronic diastolic CHF (congestive heart failure) ([place])   . Depression   . Diverticulosis   . DOE (dyspnea on exertion)   . Esophageal reflux   . GERD (gastroesophageal reflux disease)   . Hypothyroidism   . Insomnia   . LBP (low back pain)   . OAB (overactive bladder)   . Obesity   . OSA (obstructive sleep apnea)   . Osteoarthritis   . Rheumatoid arthritis(714.0)   . Shoulder pain, bilateral   . Unspecified essential hypertension   . Venous insufficiency     Past Surgical History:  Procedure Laterality Date  . APPENDECTOMY  1953  . bladder abduction-1996  1996  . breast biopsy Right 1980  . [place]  . COSMETIC SURGERY  1996  . CYSTOCELE REPAIR    . FLEXIBLE SIGMOIDOSCOPY N/A 04/10/2015   Procedure: FLEXIBLE SIGMOIDOSCOPY;  Surgeon: Arta Silence, MD;  [facility] END[facility]COPY;  Service: Endoscopy;  Laterality: N/A;  . knee arthroscopy Right 1996, 2010  . KNEE ARTHROSCOPY W/ AUTOGENOUS CARTILAGE IMPLANTATION (ACI) PROCEDURE Left 1994, 1995  . SHOULDER SURGERY  1990  . TOTAL ABDOMINAL HYSTERECTOMY  1972  . VESICOVAGINAL FISTULA CLOSURE W/ TAH    . WRIST SURGERY  1967     Current Outpatient Medications on File Prior to Visit  Medication Sig Dispense Refill  . Bempedoic Acid-Ezetimibe (NEXLIZET) 180-10 MG TABS Take 1 capsule by  mouth daily. 30 tablet 3  . carvedilol (COREG) 12.5 MG tablet Take 1 tablet (12.5 mg total) by mouth 2 (two) times daily. (Patient taking differently: Take 6.25 mg by mouth 2  (two) times daily. ) 180 tablet 3  . Cholecalciferol (VITAMIN D3) 125 MCG (5000 UT) CAPS Take 5,000 Units by mouth daily with breakfast.    . diclofenac sodium (VOLTAREN) 1 % GEL Apply 2.25 g topically 3 (three) times daily as needed.    . fluticasone (FLONASE) 50 MCG/ACT nasal spray Place 2 sprays into both nostrils daily as needed for allergies or rhinitis.     . furosemide (LASIX) 20 MG tablet Take 1 tablet (20 mg total) by mouth daily. 30 tablet   . hydrALAZINE (APRESOLINE) 25 MG tablet TAKE 1 TABLET 3 TIMES A DAY 270 tablet 1  . HYDROcodone-acetaminophen (NORCO) 7.5-325 MG tablet Take 1 tablet by mouth every 6 (six) hours as needed for moderate pain. 20 tablet 0  . isosorbide dinitrate (ISORDIL) 30 MG tablet Take 30 mg by mouth 3 (three) times daily.    Marland Kitchen levothyroxine (SYNTHROID) 125 MCG tablet Take 125 mcg by mouth daily before breakfast.     . losartan (COZAAR) 100 MG tablet Take 1 tablet (100 mg total) by mouth 2 (two) times daily. 180 tablet 1  . omeprazole (PRILOSEC) 20 MG capsule Take 20 mg by mouth 2 (two) times daily before a meal.     . rOPINIRole (REQUIP) 1 MG tablet Take 1 mg by mouth See admin instructions. Take 1 mg by mouth once a day between 3 PM-4 PM and an additional 1 mg in the evening, if no relief  12  . ipratropium (ATROVENT) 0.03 % nasal spray Place 2 sprays into both nostrils 3 (three) times daily.     No current facility-administered medications on file prior to visit.    Allergies  Allergen Reactions  . Statins Other (See Comments)    Muscle aches and INTERNAL BLEEDING  . Gabapentin Other (See Comments)    "Made me loopy"  . Methocarbamol Other (See Comments)    "Made me loopy"    Physical exam:  Today's Vitals   11/26/19 1434  BP: (!) 153/87  Pulse: 75  Weight: 207 lb (93.9 kg)  Height: 5' 3.5" (1.613 m)   Body mass index is 36.09 kg/m.   Wt Readings from Last 3 Encounters:  11/26/19 207 lb (93.9 kg)  09/19/19 219 lb (99.3 kg)  07/30/19 215 lb  (97.5 kg)     Ht Readings from Last 3 Encounters:  11/26/19 5' 3.5" (1.613 m)  09/19/19 5\' 3"  (1.6 m)  07/30/19 5' 3.5" (1.613 m)      General: The patient is awake, alert and appears not in acute distress. The patient is well groomed. Head: Normocephalic, atraumatic. Neck is supple. Mallampati 3 plus ,  neck circumference:16.5 inches . Nasal airflow not  Patent/ congested.   Retrognathia is seen.  Dental status: irregular  Cardiovascular:  Regular rate and cardiac rhythm by pulse,  without distended neck veins. Respiratory: Lungs are clear to auscultation.  Skin:  Without evidence of ankle edema, or rash. Trunk: The patient's posture is stooped, .   Neurologic exam : The patient is awake and alert, oriented to place and time.   Memory subjective described as intact.  Attention span & concentration ability appears normal.  Speech is fluent,  without  dysarthria, dysphonia or aphasia.  Mood and affect are appropriate.   Cranial nerves: no loss  of smell or taste reported  Pupils are equal and briskly reactive to light. Funduscopic exam deferred.   Extraocular movements in vertical and horizontal planes were intact , but  accomodation reveals a lazy eye on the right - without nystagmus.   Facial motor strength is symmetric and tongue and uvula move midline.  Neck ROM : rotation, tilt and flexion extension were normal for age and shoulder shrug was symmetrical.    Motor exam:  Symmetric bulk, tone and ROM.  she has bilateral knee replacements.  Dr Maureen Ralphs. Normal tone without cog wheeling, symmetric grip strength . Sensory:  deferred.  Coordination: Rapid alternating movements in the fingers/hands were of normal speed.  The Finger-to-nose maneuver was intact without evidence of ataxia, dysmetria or tremor. Gait and station: Patient could rise unassisted from a seated position,  She walks with a cane and arthralgic- walked without assistive device.   Deep tendon reflexes: in the   upper and lower extremities are symmetric and intact.  Babinski response was deferred.       After spending a total time of  25 minutes face to face and additional time for physical and neurologic examination, review of laboratory studies,  personal review of imaging studies, reports and results of other testing and review of referral information / records as far as provided in visit, I gave Lisa Mcclure a fitting for different mask. She has a frozen shoulder, making the headgear adjustments difficult. She still has to interrupt her sleep every 45-60 minutes. Nocturia related.   Assessment : Nocturnal  Urinary incontinence.  PAP intolerant - remains on Oxygen at home.   PLAN 1) continue 02- untreated OSA  2) Dear Dr Philip Aspen :  could this patient have a evening home health care attendant, someone to cath her for the night and remove the cath in the morning for her- the idea is that she can't do it herself but need to get more rest at night- this could cut down on the frequent bathroom breaks.   I have no further treatment options for this pleasant lady's OSA- CSA with hypoxemia.    I would like to Lisa Mcclure, [address] [place],  [place] [address] for allowing me to meet with and to take care of this pleasant patient.    Electronically signed by: Larey Seat, MD 11/26/2019 2:53 PM  [facility] Board certified by The [employer] of Sleep Medicine and Diplomate of the [employer] of Sleep Medicine. Board certified In Neurology through the [employer], Fellow of the [employer] of Neurology. Medical Director of [facility].

## 2019-11-26 NOTE — Patient Instructions (Signed)

## 2019-11-28 ENCOUNTER — Ambulatory Visit: Payer: Self-pay | Admitting: Adult Health

## 2019-12-03 ENCOUNTER — Other Ambulatory Visit: Payer: Self-pay | Admitting: Cardiology

## 2019-12-09 DIAGNOSIS — R05 Cough: Secondary | ICD-10-CM | POA: Diagnosis not present

## 2019-12-09 DIAGNOSIS — I110 Hypertensive heart disease with heart failure: Secondary | ICD-10-CM | POA: Diagnosis not present

## 2019-12-09 DIAGNOSIS — J069 Acute upper respiratory infection, unspecified: Secondary | ICD-10-CM | POA: Diagnosis not present

## 2019-12-09 DIAGNOSIS — I5032 Chronic diastolic (congestive) heart failure: Secondary | ICD-10-CM | POA: Diagnosis not present

## 2019-12-09 DIAGNOSIS — Z1152 Encounter for screening for COVID-19: Secondary | ICD-10-CM | POA: Diagnosis not present

## 2019-12-09 DIAGNOSIS — U071 COVID-19: Secondary | ICD-10-CM | POA: Diagnosis not present

## 2019-12-10 ENCOUNTER — Ambulatory Visit (HOSPITAL_COMMUNITY)
Admission: RE | Admit: 2019-12-10 | Discharge: 2019-12-10 | Disposition: A | Discharge status: Home / Self Care | Payer: Medicare Other | Source: Ambulatory Visit | Attending: Pulmonary Disease | Admitting: Pulmonary Disease

## 2019-12-10 ENCOUNTER — Other Ambulatory Visit: Payer: Self-pay | Admitting: Adult Health

## 2019-12-10 DIAGNOSIS — Z23 Encounter for immunization: Secondary | ICD-10-CM | POA: Diagnosis not present

## 2019-12-10 DIAGNOSIS — U071 COVID-19: Secondary | ICD-10-CM | POA: Diagnosis not present

## 2019-12-10 MED ORDER — METHYLPREDNISOLONE SODIUM SUCC 125 MG IJ SOLR: 125.0000 mg | Freq: Once | INTRAMUSCULAR | Status: DC | PRN

## 2019-12-10 MED ORDER — DIPHENHYDRAMINE HCL 50 MG/ML IJ SOLN: 50.0000 mg | Freq: Once | INTRAMUSCULAR | Status: DC | PRN

## 2019-12-10 MED ORDER — SODIUM CHLORIDE 0.9 % IV SOLN
1200.0000 mg | Freq: Once | INTRAVENOUS | Status: AC
  Administered 2019-12-10: 1200 mg via INTRAVENOUS

## 2019-12-10 MED ORDER — FAMOTIDINE IN NACL 20-0.9 MG/50ML-% IV SOLN: 20.0000 mg | Freq: Once | INTRAVENOUS | Status: DC | PRN

## 2019-12-10 MED ORDER — EPINEPHRINE 0.3 MG/0.3ML IJ SOAJ: 0.3000 mg | Freq: Once | INTRAMUSCULAR | Status: DC | PRN

## 2019-12-10 MED ORDER — ALBUTEROL SULFATE HFA 108 (90 BASE) MCG/ACT IN AERS: 2.0000 | INHALATION_SPRAY | Freq: Once | RESPIRATORY_TRACT | Status: DC | PRN

## 2019-12-10 MED ORDER — SODIUM CHLORIDE 0.9 % IV SOLN: INTRAVENOUS | Status: DC | PRN

## 2019-12-10 NOTE — Discharge Instructions (Signed)

## 2019-12-10 NOTE — Progress Notes (Signed)
  Diagnosis: COVID-19  Physician: Dr Janalee Dane  Procedure: Covid Infusion Clinic Med: casirivimab\imdevimab infusion - Provided patient with casirivimab\imdevimab fact sheet for patients, parents and caregivers prior to infusion.  Complications: No immediate complications noted.  Discharge: Discharged home   Dorene Sorrow 12/10/2019

## 2019-12-10 NOTE — Progress Notes (Signed)
I connected by phone with Lisa Mcclure on 12/10/2019 at 11:45 AM to discuss the potential use of a new treatment for mild to moderate COVID-19 viral infection in non-hospitalized patients.  This patient is a 86 y.o. female that meets the FDA criteria for Emergency Use Authorization of COVID monoclonal antibody casirivimab/imdevimab.  Has a (+) direct SARS-CoV-2 viral test result  Has mild or moderate COVID-19   Is NOT hospitalized due to COVID-19  Is within 10 days of symptom onset  Has at least one of the high risk factor(s) for progression to severe COVID-19 and/or hospitalization as defined in EUA.  Specific high risk criteria : Older age (>/= 84 yo)   I have spoken and communicated the following to the patient or parent/caregiver regarding COVID monoclonal antibody treatment:  1. FDA has authorized the emergency use for the treatment of mild to moderate COVID-19 in adults and pediatric patients with positive results of direct SARS-CoV-2 viral testing who are 82 years of age and older weighing at least 40 kg, and who are at high risk for progressing to severe COVID-19 and/or hospitalization.  2. The significant known and potential risks and benefits of COVID monoclonal antibody, and the extent to which such potential risks and benefits are unknown.  3. Information on available alternative treatments and the risks and benefits of those alternatives, including clinical trials.  4. Patients treated with COVID monoclonal antibody should continue to self-isolate and use infection control measures (e.g., wear mask, isolate, social distance, avoid sharing personal items, clean and disinfect "high touch" surfaces, and frequent handwashing) according to CDC guidelines.   5. The patient or parent/caregiver has the option to accept or refuse COVID monoclonal antibody treatment.  After reviewing this information with the patient, The patient agreed to proceed with receiving  casirivimab\imdevimab infusion and will be provided a copy of the Fact sheet prior to receiving the infusion. Scot Dock 12/10/2019 11:45 AM

## 2019-12-13 ENCOUNTER — Other Ambulatory Visit: Payer: Medicare Other

## 2019-12-20 ENCOUNTER — Emergency Department (HOSPITAL_COMMUNITY): Payer: Medicare Other

## 2019-12-20 ENCOUNTER — Emergency Department (HOSPITAL_COMMUNITY): Payer: Medicare Other | Admitting: Certified Registered"

## 2019-12-20 ENCOUNTER — Encounter (HOSPITAL_COMMUNITY)
Admission: EM | Disposition: A | Discharge status: Home / Self Care | Payer: Self-pay | Source: Home / Self Care | Attending: Vascular Surgery

## 2019-12-20 ENCOUNTER — Other Ambulatory Visit (HOSPITAL_BASED_OUTPATIENT_CLINIC_OR_DEPARTMENT_OTHER): Payer: Self-pay | Admitting: Internal Medicine

## 2019-12-20 ENCOUNTER — Other Ambulatory Visit: Payer: Self-pay

## 2019-12-20 ENCOUNTER — Inpatient Hospital Stay (HOSPITAL_COMMUNITY): Payer: Medicare Other

## 2019-12-20 ENCOUNTER — Inpatient Hospital Stay (HOSPITAL_BASED_OUTPATIENT_CLINIC_OR_DEPARTMENT_OTHER)
Admission: RE | Admit: 2019-12-20 | Discharge: 2019-12-20 | Disposition: A | Discharge status: Home / Self Care | Payer: Medicare Other | Source: Ambulatory Visit | Attending: Internal Medicine | Admitting: Internal Medicine

## 2019-12-20 ENCOUNTER — Inpatient Hospital Stay (HOSPITAL_COMMUNITY)
Admission: EM | Admit: 2019-12-20 | Discharge: 2019-12-25 | DRG: 268 | Disposition: A | Discharge status: Home Health | Payer: Medicare Other | Attending: Vascular Surgery | Admitting: Vascular Surgery

## 2019-12-20 ENCOUNTER — Encounter (HOSPITAL_BASED_OUTPATIENT_CLINIC_OR_DEPARTMENT_OTHER): Payer: Self-pay

## 2019-12-20 ENCOUNTER — Encounter (HOSPITAL_COMMUNITY): Payer: Self-pay | Admitting: Anesthesiology

## 2019-12-20 DIAGNOSIS — G4733 Obstructive sleep apnea (adult) (pediatric): Secondary | ICD-10-CM | POA: Diagnosis present

## 2019-12-20 DIAGNOSIS — E039 Hypothyroidism, unspecified: Secondary | ICD-10-CM | POA: Diagnosis present

## 2019-12-20 DIAGNOSIS — Z79890 Hormone replacement therapy (postmenopausal): Secondary | ICD-10-CM

## 2019-12-20 DIAGNOSIS — U071 COVID-19: Secondary | ICD-10-CM | POA: Diagnosis not present

## 2019-12-20 DIAGNOSIS — Z6836 Body mass index (BMI) 36.0-36.9, adult: Secondary | ICD-10-CM

## 2019-12-20 DIAGNOSIS — K5792 Diverticulitis of intestine, part unspecified, without perforation or abscess without bleeding: Secondary | ICD-10-CM

## 2019-12-20 DIAGNOSIS — I110 Hypertensive heart disease with heart failure: Secondary | ICD-10-CM | POA: Diagnosis present

## 2019-12-20 DIAGNOSIS — Z8249 Family history of ischemic heart disease and other diseases of the circulatory system: Secondary | ICD-10-CM

## 2019-12-20 DIAGNOSIS — N83202 Unspecified ovarian cyst, left side: Secondary | ICD-10-CM | POA: Diagnosis present

## 2019-12-20 DIAGNOSIS — J811 Chronic pulmonary edema: Secondary | ICD-10-CM | POA: Diagnosis not present

## 2019-12-20 DIAGNOSIS — K5730 Diverticulosis of large intestine without perforation or abscess without bleeding: Secondary | ICD-10-CM | POA: Diagnosis not present

## 2019-12-20 DIAGNOSIS — R2689 Other abnormalities of gait and mobility: Secondary | ICD-10-CM | POA: Diagnosis present

## 2019-12-20 DIAGNOSIS — I130 Hypertensive heart and chronic kidney disease with heart failure and stage 1 through stage 4 chronic kidney disease, or unspecified chronic kidney disease: Secondary | ICD-10-CM | POA: Diagnosis not present

## 2019-12-20 DIAGNOSIS — J9691 Respiratory failure, unspecified with hypoxia: Secondary | ICD-10-CM | POA: Diagnosis not present

## 2019-12-20 DIAGNOSIS — K219 Gastro-esophageal reflux disease without esophagitis: Secondary | ICD-10-CM | POA: Diagnosis present

## 2019-12-20 DIAGNOSIS — D638 Anemia in other chronic diseases classified elsewhere: Secondary | ICD-10-CM | POA: Diagnosis not present

## 2019-12-20 DIAGNOSIS — M069 Rheumatoid arthritis, unspecified: Secondary | ICD-10-CM | POA: Diagnosis present

## 2019-12-20 DIAGNOSIS — Z825 Family history of asthma and other chronic lower respiratory diseases: Secondary | ICD-10-CM | POA: Diagnosis not present

## 2019-12-20 DIAGNOSIS — I2510 Atherosclerotic heart disease of native coronary artery without angina pectoris: Secondary | ICD-10-CM | POA: Diagnosis present

## 2019-12-20 DIAGNOSIS — Z888 Allergy status to other drugs, medicaments and biological substances status: Secondary | ICD-10-CM

## 2019-12-20 DIAGNOSIS — I714 Abdominal aortic aneurysm, without rupture, unspecified: Secondary | ICD-10-CM

## 2019-12-20 DIAGNOSIS — I5032 Chronic diastolic (congestive) heart failure: Secondary | ICD-10-CM | POA: Diagnosis not present

## 2019-12-20 DIAGNOSIS — J449 Chronic obstructive pulmonary disease, unspecified: Secondary | ICD-10-CM | POA: Diagnosis present

## 2019-12-20 DIAGNOSIS — K649 Unspecified hemorrhoids: Secondary | ICD-10-CM | POA: Diagnosis present

## 2019-12-20 DIAGNOSIS — J439 Emphysema, unspecified: Secondary | ICD-10-CM | POA: Diagnosis not present

## 2019-12-20 DIAGNOSIS — K4090 Unilateral inguinal hernia, without obstruction or gangrene, not specified as recurrent: Secondary | ICD-10-CM | POA: Diagnosis not present

## 2019-12-20 DIAGNOSIS — Z79899 Other long term (current) drug therapy: Secondary | ICD-10-CM | POA: Diagnosis not present

## 2019-12-20 DIAGNOSIS — I517 Cardiomegaly: Secondary | ICD-10-CM | POA: Diagnosis not present

## 2019-12-20 DIAGNOSIS — N820 Vesicovaginal fistula: Secondary | ICD-10-CM | POA: Diagnosis present

## 2019-12-20 DIAGNOSIS — I10 Essential (primary) hypertension: Secondary | ICD-10-CM | POA: Diagnosis not present

## 2019-12-20 DIAGNOSIS — M545 Low back pain: Secondary | ICD-10-CM | POA: Diagnosis not present

## 2019-12-20 DIAGNOSIS — I480 Paroxysmal atrial fibrillation: Secondary | ICD-10-CM | POA: Diagnosis not present

## 2019-12-20 DIAGNOSIS — Z87891 Personal history of nicotine dependence: Secondary | ICD-10-CM

## 2019-12-20 DIAGNOSIS — Z95828 Presence of other vascular implants and grafts: Secondary | ICD-10-CM | POA: Diagnosis not present

## 2019-12-20 DIAGNOSIS — G8929 Other chronic pain: Secondary | ICD-10-CM | POA: Diagnosis not present

## 2019-12-20 DIAGNOSIS — Z803 Family history of malignant neoplasm of breast: Secondary | ICD-10-CM

## 2019-12-20 DIAGNOSIS — R1030 Lower abdominal pain, unspecified: Secondary | ICD-10-CM | POA: Diagnosis not present

## 2019-12-20 DIAGNOSIS — Z801 Family history of malignant neoplasm of trachea, bronchus and lung: Secondary | ICD-10-CM

## 2019-12-20 DIAGNOSIS — S2231XA Fracture of one rib, right side, initial encounter for closed fracture: Secondary | ICD-10-CM | POA: Diagnosis not present

## 2019-12-20 DIAGNOSIS — M1990 Unspecified osteoarthritis, unspecified site: Secondary | ICD-10-CM | POA: Diagnosis present

## 2019-12-20 DIAGNOSIS — I4891 Unspecified atrial fibrillation: Secondary | ICD-10-CM | POA: Diagnosis not present

## 2019-12-20 DIAGNOSIS — R0602 Shortness of breath: Secondary | ICD-10-CM | POA: Diagnosis not present

## 2019-12-20 DIAGNOSIS — F329 Major depressive disorder, single episode, unspecified: Secondary | ICD-10-CM | POA: Diagnosis present

## 2019-12-20 DIAGNOSIS — K439 Ventral hernia without obstruction or gangrene: Secondary | ICD-10-CM | POA: Diagnosis not present

## 2019-12-20 DIAGNOSIS — E669 Obesity, unspecified: Secondary | ICD-10-CM | POA: Diagnosis present

## 2019-12-20 DIAGNOSIS — Z9981 Dependence on supplemental oxygen: Secondary | ICD-10-CM

## 2019-12-20 DIAGNOSIS — Z9889 Other specified postprocedural states: Secondary | ICD-10-CM

## 2019-12-20 DIAGNOSIS — D649 Anemia, unspecified: Secondary | ICD-10-CM | POA: Diagnosis present

## 2019-12-20 DIAGNOSIS — R112 Nausea with vomiting, unspecified: Secondary | ICD-10-CM | POA: Diagnosis present

## 2019-12-20 DIAGNOSIS — E785 Hyperlipidemia, unspecified: Secondary | ICD-10-CM | POA: Diagnosis present

## 2019-12-20 DIAGNOSIS — Z7901 Long term (current) use of anticoagulants: Secondary | ICD-10-CM

## 2019-12-20 DIAGNOSIS — E7800 Pure hypercholesterolemia, unspecified: Secondary | ICD-10-CM | POA: Diagnosis not present

## 2019-12-20 DIAGNOSIS — E871 Hypo-osmolality and hyponatremia: Secondary | ICD-10-CM | POA: Diagnosis not present

## 2019-12-20 DIAGNOSIS — K5900 Constipation, unspecified: Secondary | ICD-10-CM | POA: Diagnosis present

## 2019-12-20 DIAGNOSIS — N183 Chronic kidney disease, stage 3 unspecified: Secondary | ICD-10-CM | POA: Diagnosis not present

## 2019-12-20 DIAGNOSIS — R1111 Vomiting without nausea: Secondary | ICD-10-CM | POA: Diagnosis not present

## 2019-12-20 HISTORY — PX: ABDOMINAL AORTIC ENDOVASCULAR STENT GRAFT: SHX5707

## 2019-12-20 HISTORY — PX: ULTRASOUND GUIDANCE FOR VASCULAR ACCESS: SHX6516

## 2019-12-20 LAB — CBC WITH DIFFERENTIAL/PLATELET
Abs Immature Granulocytes: 0.07 10*3/uL (ref 0.00–0.07)
Basophils Absolute: 0 10*3/uL (ref 0.0–0.1)
Basophils Relative: 0 %
Eosinophils Absolute: 0.1 10*3/uL (ref 0.0–0.5)
Eosinophils Relative: 1 %
HCT: 37.5 % (ref 36.0–46.0)
Hemoglobin: 11.7 g/dL — ABNORMAL LOW (ref 12.0–15.0)
Immature Granulocytes: 1 %
Lymphocytes Relative: 10 %
Lymphs Abs: 1.3 10*3/uL (ref 0.7–4.0)
MCH: 28.2 pg (ref 26.0–34.0)
MCHC: 31.2 g/dL (ref 30.0–36.0)
MCV: 90.4 fL (ref 80.0–100.0)
Monocytes Absolute: 1.3 10*3/uL — ABNORMAL HIGH (ref 0.1–1.0)
Monocytes Relative: 11 %
Neutro Abs: 9.8 10*3/uL — ABNORMAL HIGH (ref 1.7–7.7)
Neutrophils Relative %: 77 %
Platelets: 399 10*3/uL (ref 150–400)
RBC: 4.15 MIL/uL (ref 3.87–5.11)
RDW: 13.9 % (ref 11.5–15.5)
WBC: 12.7 10*3/uL — ABNORMAL HIGH (ref 4.0–10.5)
nRBC: 0 % (ref 0.0–0.2)

## 2019-12-20 LAB — POCT I-STAT 7, (LYTES, BLD GAS, ICA,H+H)
Acid-Base Excess: 0 mmol/L (ref 0.0–2.0)
Bicarbonate: 25.7 mmol/L (ref 20.0–28.0)
Calcium, Ion: 1.15 mmol/L (ref 1.15–1.40)
HCT: 31 % — ABNORMAL LOW (ref 36.0–46.0)
Hemoglobin: 10.5 g/dL — ABNORMAL LOW (ref 12.0–15.0)
O2 Saturation: 100 %
Potassium: 4 mmol/L (ref 3.5–5.1)
Sodium: 135 mmol/L (ref 135–145)
TCO2: 27 mmol/L (ref 22–32)
pCO2 arterial: 43.3 mmHg (ref 32.0–48.0)
pH, Arterial: 7.382 (ref 7.350–7.450)
pO2, Arterial: 225 mmHg — ABNORMAL HIGH (ref 83.0–108.0)

## 2019-12-20 LAB — CBC
HCT: 34.6 % — ABNORMAL LOW (ref 36.0–46.0)
Hemoglobin: 10.8 g/dL — ABNORMAL LOW (ref 12.0–15.0)
MCH: 28.4 pg (ref 26.0–34.0)
MCHC: 31.2 g/dL (ref 30.0–36.0)
MCV: 91.1 fL (ref 80.0–100.0)
Platelets: 343 10*3/uL (ref 150–400)
RBC: 3.8 MIL/uL — ABNORMAL LOW (ref 3.87–5.11)
RDW: 13.9 % (ref 11.5–15.5)
WBC: 17 10*3/uL — ABNORMAL HIGH (ref 4.0–10.5)
nRBC: 0 % (ref 0.0–0.2)

## 2019-12-20 LAB — TYPE AND SCREEN
ABO/RH(D): B POS
Antibody Screen: NEGATIVE

## 2019-12-20 LAB — PROTIME-INR
INR: 1.3 — ABNORMAL HIGH (ref 0.8–1.2)
INR: 1.6 — ABNORMAL HIGH (ref 0.8–1.2)
Prothrombin Time: 15.8 seconds — ABNORMAL HIGH (ref 11.4–15.2)
Prothrombin Time: 18.2 seconds — ABNORMAL HIGH (ref 11.4–15.2)

## 2019-12-20 LAB — LIPASE, BLOOD: Lipase: 18 U/L (ref 11–51)

## 2019-12-20 LAB — HEPATIC FUNCTION PANEL
ALT: 41 U/L (ref 0–44)
AST: 46 U/L — ABNORMAL HIGH (ref 15–41)
Albumin: 3.1 g/dL — ABNORMAL LOW (ref 3.5–5.0)
Alkaline Phosphatase: 65 U/L (ref 38–126)
Bilirubin, Direct: 0.2 mg/dL (ref 0.0–0.2)
Indirect Bilirubin: 0.5 mg/dL (ref 0.3–0.9)
Total Bilirubin: 0.7 mg/dL (ref 0.3–1.2)
Total Protein: 6.9 g/dL (ref 6.5–8.1)

## 2019-12-20 LAB — BASIC METABOLIC PANEL
Anion gap: 12 (ref 5–15)
Anion gap: 11 (ref 5–15)
BUN: 19 mg/dL (ref 8–23)
BUN: 14 mg/dL (ref 8–23)
CO2: 24 mmol/L (ref 22–32)
CO2: 23 mmol/L (ref 22–32)
Calcium: 8.8 mg/dL — ABNORMAL LOW (ref 8.9–10.3)
Calcium: 8.3 mg/dL — ABNORMAL LOW (ref 8.9–10.3)
Chloride: 95 mmol/L — ABNORMAL LOW (ref 98–111)
Chloride: 101 mmol/L (ref 98–111)
Creatinine, Ser: 0.87 mg/dL (ref 0.44–1.00)
Creatinine, Ser: 0.73 mg/dL (ref 0.44–1.00)
GFR calc Af Amer: >60 mL/min (ref >60)
GFR calc Af Amer: >60 mL/min (ref >60)
GFR calc non Af Amer: >60 mL/min (ref >60)
GFR calc non Af Amer: >60 mL/min (ref >60)
Glucose, Bld: 105 mg/dL — ABNORMAL HIGH (ref 70–99)
Glucose, Bld: 105 mg/dL — ABNORMAL HIGH (ref 70–99)
Potassium: 4.2 mmol/L (ref 3.5–5.1)
Potassium: 4.1 mmol/L (ref 3.5–5.1)
Sodium: 131 mmol/L — ABNORMAL LOW (ref 135–145)
Sodium: 135 mmol/L (ref 135–145)

## 2019-12-20 LAB — APTT
aPTT: 33 seconds (ref 24–36)
aPTT: 136 seconds — ABNORMAL HIGH (ref 24–36)

## 2019-12-20 LAB — SARS CORONAVIRUS 2 BY RT PCR (HOSPITAL ORDER, PERFORMED IN ~~LOC~~ HOSPITAL LAB): SARS Coronavirus 2: POSITIVE — AB

## 2019-12-20 LAB — TROPONIN I (HIGH SENSITIVITY)
Troponin I (High Sensitivity): 13 ng/L (ref <18)
Troponin I (High Sensitivity): 13 ng/L (ref <18)

## 2019-12-20 LAB — POCT ACTIVATED CLOTTING TIME
Activated Clotting Time: 197 seconds
Activated Clotting Time: 219 seconds
Activated Clotting Time: 241 seconds
Activated Clotting Time: 274 seconds

## 2019-12-20 LAB — LACTIC ACID, PLASMA: Lactic Acid, Venous: 1.2 mmol/L (ref 0.5–1.9)

## 2019-12-20 LAB — MAGNESIUM: Magnesium: 1.6 mg/dL — ABNORMAL LOW (ref 1.7–2.4)

## 2019-12-20 SURGERY — INSERTION, ENDOVASCULAR STENT GRAFT, AORTA, ABDOMINAL
Anesthesia: General | Site: Groin

## 2019-12-20 MED ORDER — PROPOFOL 10 MG/ML IV BOLUS
INTRAVENOUS | Status: DC | PRN
  Administered 2019-12-20: 150 mg via INTRAVENOUS

## 2019-12-20 MED ORDER — ISOSORBIDE DINITRATE 10 MG PO TABS
30.0000 mg | ORAL_TABLET | Freq: Three times a day (TID) | ORAL | Status: DC
  Administered 2019-12-21 – 2019-12-25 (×9): 30 mg via ORAL
  Filled 2019-12-20 (×10): qty 3

## 2019-12-20 MED ORDER — LEVOTHYROXINE SODIUM 25 MCG PO TABS
125.0000 ug | ORAL_TABLET | Freq: Every day | ORAL | Status: DC
  Administered 2019-12-21 – 2019-12-25 (×5): 125 ug via ORAL
  Filled 2019-12-20 (×5): qty 1

## 2019-12-20 MED ORDER — SUGAMMADEX SODIUM 500 MG/5ML IV SOLN
INTRAVENOUS | Status: AC
  Filled 2019-12-20: qty 5

## 2019-12-20 MED ORDER — BISACODYL 5 MG PO TBEC: 5.0000 mg | DELAYED_RELEASE_TABLET | Freq: Every day | ORAL | Status: DC | PRN

## 2019-12-20 MED ORDER — IOHEXOL 300 MG/ML  SOLN
100.0000 mL | Freq: Once | INTRAMUSCULAR | Status: AC | PRN
  Administered 2019-12-20: 100 mL via INTRAVENOUS

## 2019-12-20 MED ORDER — FUROSEMIDE 20 MG PO TABS
20.0000 mg | ORAL_TABLET | Freq: Every day | ORAL | Status: DC
  Administered 2019-12-21 – 2019-12-25 (×5): 20 mg via ORAL
  Filled 2019-12-20 (×5): qty 1

## 2019-12-20 MED ORDER — PROTAMINE SULFATE 10 MG/ML IV SOLN
INTRAVENOUS | Status: DC | PRN
  Administered 2019-12-20: 50 mg via INTRAVENOUS

## 2019-12-20 MED ORDER — PROPOFOL 10 MG/ML IV BOLUS
INTRAVENOUS | Status: AC
  Filled 2019-12-20: qty 20

## 2019-12-20 MED ORDER — GUAIFENESIN-DM 100-10 MG/5ML PO SYRP: 15.0000 mL | ORAL_SOLUTION | ORAL | Status: DC | PRN

## 2019-12-20 MED ORDER — SODIUM CHLORIDE 0.9 % IV SOLN: INTRAVENOUS | Status: DC | PRN

## 2019-12-20 MED ORDER — ONDANSETRON HCL 4 MG/2ML IJ SOLN
INTRAMUSCULAR | Status: DC | PRN
  Administered 2019-12-20: 4 mg via INTRAVENOUS

## 2019-12-20 MED ORDER — DILTIAZEM HCL-DEXTROSE 125-5 MG/125ML-% IV SOLN (PREMIX)
5.0000 mg/h | INTRAVENOUS | Status: DC
  Administered 2019-12-20: 5 mg/h via INTRAVENOUS
  Filled 2019-12-20 (×2): qty 125

## 2019-12-20 MED ORDER — LOSARTAN POTASSIUM 50 MG PO TABS
100.0000 mg | ORAL_TABLET | Freq: Every day | ORAL | Status: DC
  Administered 2019-12-21 – 2019-12-25 (×4): 100 mg via ORAL
  Filled 2019-12-20 (×4): qty 2

## 2019-12-20 MED ORDER — FENTANYL CITRATE (PF) 250 MCG/5ML IJ SOLN
INTRAMUSCULAR | Status: DC | PRN
Start: 2019-12-20 — End: 2019-12-20
  Administered 2019-12-20: 50 ug via INTRAVENOUS

## 2019-12-20 MED ORDER — HYDRALAZINE HCL 20 MG/ML IJ SOLN: 5.0000 mg | INTRAMUSCULAR | Status: DC | PRN

## 2019-12-20 MED ORDER — CARVEDILOL 12.5 MG PO TABS: 12.5000 mg | ORAL_TABLET | Freq: Two times a day (BID) | ORAL | Status: DC

## 2019-12-20 MED ORDER — DILTIAZEM HCL-DEXTROSE 125-5 MG/125ML-% IV SOLN (PREMIX)
5.0000 mg/h | INTRAVENOUS | Status: DC
  Administered 2019-12-21: 15 mg/h via INTRAVENOUS
  Administered 2019-12-22: 5 mg/h via INTRAVENOUS
  Administered 2019-12-23: 2.5 mg/h via INTRAVENOUS
  Filled 2019-12-20 (×5): qty 125

## 2019-12-20 MED ORDER — FENTANYL CITRATE (PF) 100 MCG/2ML IJ SOLN
25.0000 ug | INTRAMUSCULAR | Status: DC | PRN
  Administered 2019-12-20: 25 ug via INTRAVENOUS

## 2019-12-20 MED ORDER — DOCUSATE SODIUM 100 MG PO CAPS
100.0000 mg | ORAL_CAPSULE | Freq: Every day | ORAL | Status: DC
  Administered 2019-12-21 – 2019-12-25 (×5): 100 mg via ORAL
  Filled 2019-12-20 (×5): qty 1

## 2019-12-20 MED ORDER — SODIUM CHLORIDE 0.9 % IV SOLN: 500.0000 mL | Freq: Once | INTRAVENOUS | Status: DC | PRN

## 2019-12-20 MED ORDER — FLEET ENEMA 7-19 GM/118ML RE ENEM
1.0000 | ENEMA | Freq: Once | RECTAL | Status: AC | PRN
  Administered 2019-12-25: 1 via RECTAL
  Filled 2019-12-20: qty 1

## 2019-12-20 MED ORDER — PANTOPRAZOLE SODIUM 40 MG PO TBEC
40.0000 mg | DELAYED_RELEASE_TABLET | Freq: Every day | ORAL | Status: DC
  Administered 2019-12-21 – 2019-12-25 (×5): 40 mg via ORAL
  Filled 2019-12-20 (×5): qty 1

## 2019-12-20 MED ORDER — HEPARIN SODIUM (PORCINE) 1000 UNIT/ML IJ SOLN
INTRAMUSCULAR | Status: DC | PRN
  Administered 2019-12-20 (×2): 2000 [IU] via INTRAVENOUS
  Administered 2019-12-20: 10000 [IU] via INTRAVENOUS
  Administered 2019-12-20 (×2): 3000 [IU] via INTRAVENOUS

## 2019-12-20 MED ORDER — PHENOL 1.4 % MT LIQD: 1.0000 | OROMUCOSAL | Status: DC | PRN

## 2019-12-20 MED ORDER — ALUM & MAG HYDROXIDE-SIMETH 200-200-20 MG/5ML PO SUSP: 15.0000 mL | ORAL | Status: DC | PRN

## 2019-12-20 MED ORDER — DEXAMETHASONE SODIUM PHOSPHATE 10 MG/ML IJ SOLN
INTRAMUSCULAR | Status: AC
  Filled 2019-12-20: qty 1

## 2019-12-20 MED ORDER — HEPARIN SODIUM (PORCINE) 5000 UNIT/ML IJ SOLN
5000.0000 [IU] | Freq: Three times a day (TID) | INTRAMUSCULAR | Status: DC
  Administered 2019-12-21: 5000 [IU] via SUBCUTANEOUS
  Filled 2019-12-20: qty 1

## 2019-12-20 MED ORDER — ACETAMINOPHEN 650 MG RE SUPP: 325.0000 mg | RECTAL | Status: DC | PRN

## 2019-12-20 MED ORDER — VASOPRESSIN 20 UNIT/ML IV SOLN
INTRAVENOUS | Status: AC
  Filled 2019-12-20: qty 1

## 2019-12-20 MED ORDER — 0.9 % SODIUM CHLORIDE (POUR BTL) OPTIME
TOPICAL | Status: DC | PRN
  Administered 2019-12-20: 1000 mL

## 2019-12-20 MED ORDER — POTASSIUM CHLORIDE CRYS ER 20 MEQ PO TBCR: 20.0000 meq | EXTENDED_RELEASE_TABLET | Freq: Every day | ORAL | Status: DC | PRN

## 2019-12-20 MED ORDER — ASPIRIN EC 81 MG PO TBEC
81.0000 mg | DELAYED_RELEASE_TABLET | Freq: Every day | ORAL | Status: DC
  Administered 2019-12-21 – 2019-12-25 (×5): 81 mg via ORAL
  Filled 2019-12-20 (×5): qty 1

## 2019-12-20 MED ORDER — ONDANSETRON HCL 4 MG/2ML IJ SOLN
INTRAMUSCULAR | Status: AC
  Filled 2019-12-20: qty 2

## 2019-12-20 MED ORDER — MAGNESIUM SULFATE 2 GM/50ML IV SOLN: 2.0000 g | Freq: Every day | INTRAVENOUS | Status: DC | PRN

## 2019-12-20 MED ORDER — DICLOFENAC SODIUM 1 % TD GEL: 2.0000 g | Freq: Three times a day (TID) | TRANSDERMAL | Status: DC | PRN

## 2019-12-20 MED ORDER — SODIUM CHLORIDE 0.9 % IV SOLN: INTRAVENOUS | Status: DC

## 2019-12-20 MED ORDER — SODIUM CHLORIDE 0.9 % IV BOLUS
1000.0000 mL | Freq: Once | INTRAVENOUS | Status: AC
  Administered 2019-12-20: 1000 mL via INTRAVENOUS

## 2019-12-20 MED ORDER — ROPINIROLE HCL 1 MG PO TABS
2.0000 mg | ORAL_TABLET | Freq: Every day | ORAL | Status: DC
  Administered 2019-12-21 – 2019-12-24 (×4): 2 mg via ORAL
  Filled 2019-12-20 (×5): qty 2

## 2019-12-20 MED ORDER — PROTAMINE SULFATE 10 MG/ML IV SOLN
INTRAVENOUS | Status: AC
  Filled 2019-12-20: qty 25

## 2019-12-20 MED ORDER — IODIXANOL 320 MG/ML IV SOLN
INTRAVENOUS | Status: DC | PRN
  Administered 2019-12-20: 20 mL via INTRA_ARTERIAL

## 2019-12-20 MED ORDER — SODIUM CHLORIDE 0.9 % IV SOLN
INTRAVENOUS | Status: AC
  Filled 2019-12-20: qty 1.2

## 2019-12-20 MED ORDER — VASOPRESSIN 20 UNIT/ML IV SOLN
INTRAVENOUS | Status: DC | PRN
  Administered 2019-12-20: 1 [IU] via INTRAVENOUS

## 2019-12-20 MED ORDER — DILTIAZEM LOAD VIA INFUSION
25.0000 mg | Freq: Once | INTRAVENOUS | Status: AC
  Administered 2019-12-20: 25 mg via INTRAVENOUS
  Filled 2019-12-20: qty 25

## 2019-12-20 MED ORDER — PHENYLEPHRINE 40 MCG/ML (10ML) SYRINGE FOR IV PUSH (FOR BLOOD PRESSURE SUPPORT)
PREFILLED_SYRINGE | INTRAVENOUS | Status: DC | PRN
  Administered 2019-12-20 (×2): 200 ug via INTRAVENOUS

## 2019-12-20 MED ORDER — METOPROLOL TARTRATE 5 MG/5ML IV SOLN: 2.0000 mg | INTRAVENOUS | Status: DC | PRN

## 2019-12-20 MED ORDER — IODIXANOL 320 MG/ML IV SOLN
INTRAVENOUS | Status: DC | PRN
  Administered 2019-12-20: 88.3 mL via INTRA_ARTERIAL

## 2019-12-20 MED ORDER — SODIUM CHLORIDE 0.9 % IV SOLN
INTRAVENOUS | Status: DC | PRN
  Administered 2019-12-20: 500 mL

## 2019-12-20 MED ORDER — CEFAZOLIN SODIUM-DEXTROSE 2-4 GM/100ML-% IV SOLN
2.0000 g | Freq: Three times a day (TID) | INTRAVENOUS | Status: AC
  Administered 2019-12-21 (×2): 2 g via INTRAVENOUS
  Filled 2019-12-20 (×2): qty 100

## 2019-12-20 MED ORDER — HYDROCODONE-ACETAMINOPHEN 5-325 MG PO TABS
1.0000 | ORAL_TABLET | ORAL | Status: DC | PRN
  Administered 2019-12-21 – 2019-12-23 (×8): 2 via ORAL
  Administered 2019-12-24: 1 via ORAL
  Administered 2019-12-24 – 2019-12-25 (×5): 2 via ORAL
  Filled 2019-12-20 (×5): qty 2
  Filled 2019-12-20: qty 1
  Filled 2019-12-20 (×8): qty 2

## 2019-12-20 MED ORDER — CEFAZOLIN SODIUM-DEXTROSE 2-3 GM-%(50ML) IV SOLR
INTRAVENOUS | Status: DC | PRN
  Administered 2019-12-20: 2 g via INTRAVENOUS

## 2019-12-20 MED ORDER — FLUTICASONE PROPIONATE 50 MCG/ACT NA SUSP: 2.0000 | Freq: Every day | NASAL | Status: DC | PRN

## 2019-12-20 MED ORDER — LABETALOL HCL 5 MG/ML IV SOLN: 10.0000 mg | INTRAVENOUS | Status: DC | PRN

## 2019-12-20 MED ORDER — PHENYLEPHRINE HCL-NACL 10-0.9 MG/250ML-% IV SOLN
INTRAVENOUS | Status: DC | PRN
  Administered 2019-12-20: 30 ug/min via INTRAVENOUS

## 2019-12-20 MED ORDER — DILTIAZEM LOAD VIA INFUSION
15.0000 mg | Freq: Once | INTRAVENOUS | Status: DC
  Filled 2019-12-20: qty 15

## 2019-12-20 MED ORDER — LACTATED RINGERS IV SOLN: INTRAVENOUS | Status: DC | PRN

## 2019-12-20 MED ORDER — BEMPEDOIC ACID-EZETIMIBE 180-10 MG PO TABS
1.0000 | ORAL_TABLET | Freq: Every day | ORAL | Status: DC
  Filled 2019-12-20: qty 1

## 2019-12-20 MED ORDER — FENTANYL CITRATE (PF) 250 MCG/5ML IJ SOLN
INTRAMUSCULAR | Status: AC
  Filled 2019-12-20: qty 5

## 2019-12-20 MED ORDER — LIDOCAINE 2% (20 MG/ML) 5 ML SYRINGE
INTRAMUSCULAR | Status: DC | PRN
  Administered 2019-12-20: 40 mg via INTRAVENOUS

## 2019-12-20 MED ORDER — FENTANYL CITRATE (PF) 100 MCG/2ML IJ SOLN
INTRAMUSCULAR | Status: AC
Start: 2019-12-20 — End: 2019-12-21
  Filled 2019-12-20: qty 2

## 2019-12-20 MED ORDER — IPRATROPIUM BROMIDE 0.06 % NA SOLN
2.0000 | Freq: Three times a day (TID) | NASAL | Status: DC
  Administered 2019-12-21 – 2019-12-25 (×12): 2 via NASAL
  Filled 2019-12-20: qty 15

## 2019-12-20 MED ORDER — SENNOSIDES-DOCUSATE SODIUM 8.6-50 MG PO TABS: 1.0000 | ORAL_TABLET | Freq: Every evening | ORAL | Status: DC | PRN

## 2019-12-20 MED ORDER — DEXAMETHASONE SODIUM PHOSPHATE 10 MG/ML IJ SOLN
INTRAMUSCULAR | Status: DC | PRN
  Administered 2019-12-20: 10 mg via INTRAVENOUS

## 2019-12-20 MED ORDER — FENTANYL CITRATE (PF) 100 MCG/2ML IJ SOLN
50.0000 ug | Freq: Once | INTRAMUSCULAR | Status: AC
  Administered 2019-12-20: 50 ug via INTRAVENOUS
  Filled 2019-12-20: qty 2

## 2019-12-20 MED ORDER — HYDROMORPHONE HCL 1 MG/ML IJ SOLN
0.5000 mg | INTRAMUSCULAR | Status: DC | PRN
  Administered 2019-12-22 – 2019-12-23 (×3): 1 mg via INTRAVENOUS
  Filled 2019-12-20 (×3): qty 1

## 2019-12-20 MED ORDER — ACETAMINOPHEN 325 MG PO TABS: 325.0000 mg | ORAL_TABLET | ORAL | Status: DC | PRN

## 2019-12-20 MED ORDER — ONDANSETRON HCL 4 MG/2ML IJ SOLN: 4.0000 mg | Freq: Four times a day (QID) | INTRAMUSCULAR | Status: DC | PRN

## 2019-12-20 MED ORDER — SUGAMMADEX SODIUM 200 MG/2ML IV SOLN
INTRAVENOUS | Status: DC | PRN
  Administered 2019-12-20: 300 mg via INTRAVENOUS

## 2019-12-20 MED ORDER — HYDRALAZINE HCL 25 MG PO TABS
25.0000 mg | ORAL_TABLET | Freq: Three times a day (TID) | ORAL | Status: DC
  Administered 2019-12-21: 25 mg via ORAL
  Filled 2019-12-20: qty 1

## 2019-12-20 MED ORDER — ROCURONIUM BROMIDE 10 MG/ML (PF) SYRINGE
PREFILLED_SYRINGE | INTRAVENOUS | Status: DC | PRN
  Administered 2019-12-20: 60 mg via INTRAVENOUS

## 2019-12-20 SURGICAL SUPPLY — 63 items
CANISTER SUCT 3000ML PPV (MISCELLANEOUS) ×3 IMPLANT
CATH ACCU-VU SIZ PIG 5F 100CM (CATHETERS) ×3 IMPLANT
CATH BEACON 5 .035 65 KMP TIP (CATHETERS) IMPLANT
CATH BEACON 5.038 65CM KMP-01 (CATHETERS) ×3 IMPLANT
COVER WAND RF STERILE (DRAPES) IMPLANT
DERMABOND ADVANCED (GAUZE/BANDAGES/DRESSINGS) ×1
DERMABOND ADVANCED .7 DNX12 (GAUZE/BANDAGES/DRESSINGS) ×2 IMPLANT
DEVICE CLOSURE PERCLS PRGLD 6F (VASCULAR PRODUCTS) ×6 IMPLANT
DEVICE TORQUE KENDALL .025-038 (MISCELLANEOUS) IMPLANT
DRSG TEGADERM 2-3/8X2-3/4 SM (GAUZE/BANDAGES/DRESSINGS) ×6 IMPLANT
ELECT REM PT RETURN 9FT ADLT (ELECTROSURGICAL) ×6
ELECTRODE REM PT RTRN 9FT ADLT (ELECTROSURGICAL) ×4 IMPLANT
EXCLDR TRNK ENDO 26X14.5X12 16 (Endovascular Graft) ×3 IMPLANT
EXCLUDER TNK END 26X14.5X12 16 (Endovascular Graft) ×2 IMPLANT
GAUZE SPONGE 2X2 8PLY STRL LF (GAUZE/BANDAGES/DRESSINGS) ×4 IMPLANT
GLOVE BIO SURGEON STRL SZ7.5 (GLOVE) ×3 IMPLANT
GLOVE BIOGEL PI IND STRL 7.5 (GLOVE) ×4 IMPLANT
GLOVE BIOGEL PI IND STRL 8 (GLOVE) ×2 IMPLANT
GLOVE BIOGEL PI INDICATOR 7.5 (GLOVE) ×2
GLOVE BIOGEL PI INDICATOR 8 (GLOVE) ×1
GLOVE ECLIPSE 7.0 STRL STRAW (GLOVE) ×3 IMPLANT
GLOVE SURG SS PI 7.5 STRL IVOR (GLOVE) ×3 IMPLANT
GOWN STRL REUS W/ TWL LRG LVL3 (GOWN DISPOSABLE) ×10 IMPLANT
GOWN STRL REUS W/ TWL XL LVL3 (GOWN DISPOSABLE) ×2 IMPLANT
GOWN STRL REUS W/TWL LRG LVL3 (GOWN DISPOSABLE) ×15
GOWN STRL REUS W/TWL XL LVL3 (GOWN DISPOSABLE) ×3
GRAFT BALLN CATH 65CM (STENTS) ×2 IMPLANT
GUIDEWIRE ANGLED .035X150CM (WIRE) IMPLANT
K-WIRE 102X1.4 (WIRE) ×3 IMPLANT
KIT BASIN OR (CUSTOM PROCEDURE TRAY) ×3 IMPLANT
KIT TURNOVER KIT B (KITS) ×3 IMPLANT
LEG CONTRALATERAL 16X20X9.5 (Endovascular Graft) ×3 IMPLANT
LEG CONTRALETERAL16X16X11.5 (Endovascular Graft) ×3 IMPLANT
NEEDLE PERC 18GX7CM (NEEDLE) ×3 IMPLANT
NS IRRIG 1000ML POUR BTL (IV SOLUTION) ×3 IMPLANT
PACK ENDOVASCULAR (PACKS) ×3 IMPLANT
PAD ARMBOARD 7.5X6 YLW CONV (MISCELLANEOUS) ×6 IMPLANT
PERCLOSE PROGLIDE 6F (VASCULAR PRODUCTS) ×9
SET MICROPUNCTURE 5F STIFF (MISCELLANEOUS) ×9 IMPLANT
SHEATH BRITE TIP 8FR 23CM (SHEATH) ×3 IMPLANT
SHEATH PINNACLE 5F 10CM (SHEATH) IMPLANT
SHEATH PINNACLE 8F 10CM (SHEATH) ×3 IMPLANT
SPONGE GAUZE 2X2 STER 10/PKG (GAUZE/BANDAGES/DRESSINGS) ×2
STENT GRAFT BALLN CATH 65CM (STENTS) ×3
STENT GRAFT CONTRALAT 16X11.5 (Endovascular Graft) ×2 IMPLANT
STENT GRAFT CONTRALAT 16X20X9. (Endovascular Graft) ×2 IMPLANT
STOPCOCK MORSE 400PSI 3WAY (MISCELLANEOUS) ×3 IMPLANT
SUT ETHILON 3 0 PS 1 (SUTURE) IMPLANT
SUT MNCRL AB 4-0 PS2 18 (SUTURE) ×3 IMPLANT
SUT PROLENE 5 0 C 1 24 (SUTURE) ×3 IMPLANT
SUT VIC AB 2-0 CTX 36 (SUTURE) IMPLANT
SUT VIC AB 3-0 SH 27 (SUTURE) ×3
SUT VIC AB 3-0 SH 27X BRD (SUTURE) ×2 IMPLANT
SYR 20ML LL LF (SYRINGE) ×3 IMPLANT
SYR MEDRAD MARK V 150ML (SYRINGE) ×3 IMPLANT
TOWEL GREEN STERILE (TOWEL DISPOSABLE) ×3 IMPLANT
TRAY FOLEY MTR SLVR 16FR STAT (SET/KITS/TRAYS/PACK) ×3 IMPLANT
TUBING HIGH PRESSURE 120CM (CONNECTOR) ×3 IMPLANT
TUBING INJECTOR 48 (MISCELLANEOUS) ×3 IMPLANT
WIRE AMPLATZ SS-J .035X180CM (WIRE) ×6 IMPLANT
WIRE BENTSON .035X145CM (WIRE) ×6 IMPLANT
WIRE ROSEN-J .035X260CM (WIRE) ×3 IMPLANT
WIRE TORQFLEX AUST .018X40CM (WIRE) ×6 IMPLANT

## 2019-12-20 NOTE — ED Triage Notes (Signed)
Pt here  From home with a ct scan that showed a AAA  , pt has a known AAA that has been followed by Dr Einar Gip , pt is c/o low abd pain

## 2019-12-20 NOTE — Transfer of Care (Signed)
Immediate Anesthesia Transfer of Care Note  Patient: Lisa Mcclure  Procedure(s) Performed: ABDOMINAL AORTIC ENDOVASCULAR STENT GRAFT (N/A ) ULTRASOUND GUIDANCE FOR VASCULAR ACCESS (Bilateral Groin)  Patient Location: PACU  Anesthesia Type:General  Level of Consciousness: awake, alert  and oriented  Airway & Oxygen Therapy: Patient connected to face mask oxygen  Post-op Assessment: Report given to RN and Post -op Vital signs reviewed and stable  Post vital signs: Reviewed and stable  Last Vitals:  Vitals Value Taken Time  BP 140/92 12/20/19 2045  Temp    Pulse 111 12/20/19 2050  Resp 21 12/20/19 2050  SpO2 96 % 12/20/19 2050  Vitals shown include unvalidated device data.  Last Pain:  Vitals:   12/20/19 1523  TempSrc:   PainSc: 6          Complications: No complications documented.

## 2019-12-20 NOTE — OR Nursing (Signed)
At completion of case, bilateral groins assessed.   Bilateral groins soft and unremarkable; no hematoma noted.

## 2019-12-20 NOTE — Consult Note (Addendum)
[facility] NOTE   MRN : [id number]  Reason for Consult: AAA Referring Physician: ED  History of Present Illness: Lisa Mcclure  is a 86 y.o.  female  with past medical history of obesity, chronic diastolic CHF, hypertensive heart disease, HTN, anemia, hemorrhoids, depression, diverticulosis, esophageal reflux, OSA intolerant to CPAP on nocturnal 02, rheumatoid arthritis, and venous insufficiency.   She has been followed in the past by Dr. Einar Gip.  Her last AAA duplex was 01/01/19 abdominal aortic aneurysm measuring 4.5 x 4.52 x 4.52.  She reported to the ED with CC of sever abdominal and lumbar  pain.  Her daughter states she was acting different last night and this am she could not eat or take her daily medications due to increased pain.  She lives independently at home.   No current facility-administered medications for this encounter.   Current Outpatient Medications  Medication Sig Dispense Refill  . Bempedoic Acid-Ezetimibe (NEXLIZET) 180-10 MG TABS Take 1 capsule by mouth daily. 30 tablet 3  . carvedilol (COREG) 12.5 MG tablet Take 1 tablet (12.5 mg total) by mouth 2 (two) times daily. (Patient taking differently: Take 6.25 mg by mouth 2 (two) times daily. ) 180 tablet 3  . Cholecalciferol (VITAMIN D3) 125 MCG (5000 UT) CAPS Take 5,000 Units by mouth daily with breakfast.    . diclofenac sodium (VOLTAREN) 1 % GEL Apply 2.25 g topically 3 (three) times daily as needed.    . fluticasone (FLONASE) 50 MCG/ACT nasal spray Place 2 sprays into both nostrils daily as needed for allergies or rhinitis.     . furosemide (LASIX) 20 MG tablet Take 1 tablet (20 mg total) by mouth daily. 30 tablet   . hydrALAZINE (APRESOLINE) 25 MG tablet TAKE 1 TABLET 3 TIMES A DAY 270 tablet 1  . HYDROcodone-acetaminophen (NORCO) 7.5-325 MG tablet Take 1 tablet by mouth every 6 (six) hours as needed for moderate pain. 20 tablet 0  . ipratropium (ATROVENT) 0.03 % nasal spray  Place 2 sprays into both nostrils 3 (three) times daily.    . isosorbide dinitrate (ISORDIL) 30 MG tablet Take 30 mg by mouth 3 (three) times daily.    Marland Kitchen levothyroxine (SYNTHROID) 125 MCG tablet Take 125 mcg by mouth daily before breakfast.     . losartan (COZAAR) 100 MG tablet TAKE 1 TABLET(100 MG) BY MOUTH TWICE DAILY 180 tablet 1  . omeprazole (PRILOSEC) 20 MG capsule Take 20 mg by mouth 2 (two) times daily before a meal.     . rOPINIRole (REQUIP) 1 MG tablet Take 1 mg by mouth See admin instructions. Take 1 mg by mouth once a day between 3 PM-4 PM and an additional 1 mg in the evening, if no relief  12    Pt meds include: Statin :No Betablocker: No ASA: No Other anticoagulants/antiplatelets: No   Past Medical History:  Diagnosis Date  . Anemia    h/o hemorrhoidal bleeding and blood transfusion  . Cardiomegaly   . Chronic diastolic CHF (congestive heart failure) ([place])   . Depression   . Diverticulosis   . DOE (dyspnea on exertion)   . Esophageal reflux   . GERD (gastroesophageal reflux disease)   . Hypothyroidism   . Insomnia   . LBP (low back pain)   . OAB (overactive bladder)   . Obesity   . OSA (obstructive sleep apnea)   . Osteoarthritis   . Rheumatoid arthritis(714.0)   . Shoulder pain, bilateral   .  Unspecified essential hypertension   . Venous insufficiency     Past Surgical History:  Procedure Laterality Date  . APPENDECTOMY  1953  . bladder abduction-1996  1996  . breast biopsy Right 1980  . [place]  . COSMETIC SURGERY  1996  . CYSTOCELE REPAIR    . FLEXIBLE SIGMOIDOSCOPY N/A 04/10/2015   Procedure: FLEXIBLE SIGMOIDOSCOPY;  Surgeon: Arta Silence, MD;  Location[facility] ENDOSCOPY;  Service: Endoscopy;  Laterality: N/A;  . knee arthroscopy Right 1996, 2010  . KNEE ARTHROSCOPY W/ AUTOGENOUS CARTILAGE IMPLANTATION (ACI) PROCEDURE Left 1994, 1995  . SHOULDER SURGERY  1990  . TOTAL ABDOMINAL HYSTERECTOMY  1972  . VESICOVAGINAL  FISTULA CLOSURE W/ TAH    . WRIST SURGERY  1967    Social History Social History   Tobacco Use  . Smoking status: Former Smoker    Packs/day: 0.20    Years: 10.00    Pack years: 2.00    Types: Cigarettes    Quit date: 04/11/1984    Years since quitting: 35.7  . Smokeless tobacco: Never Used  Vaping Use  . Vaping Use: Never used  Substance Use Topics  . Alcohol use: Yes    Alcohol/week: 0.0 standard drinks    Comment: cocktail once a week  . Drug use: No    Family History Family History  Problem Relation Age of Onset  . COPD Father   . Lung cancer Mother   . Heart attack Maternal Grandmother 80  . Breast cancer Maternal Aunt   . Lung cancer Son     Allergies  Allergen Reactions  . Statins Other (See Comments)    Muscle aches and INTERNAL BLEEDING  . Gabapentin Other (See Comments)    "Made me loopy"  . Methocarbamol Other (See Comments)    "Made me loopy"     REVIEW OF SYSTEMS  General: [ ]  Weight loss, [ ]  Fever, [ ]  chills Neurologic: [ ]  Dizziness, [ ]  Blackouts, [ ]  Seizure [ ]  Stroke, [ ]  "Mini stroke", [ ]  Slurred speech, [ ]  Temporary blindness; [ ]  weakness in arms or legs, [ ]  Hoarseness [ ]  Dysphagia Cardiac: [ ]  Chest pain/pressure, [ ]  Shortness of breath at rest [ ]  Shortness of breath with exertion, [ ]  Atrial fibrillation or irregular heartbeat  Vascular: [ ]  Pain in legs with walking, [ ]  Pain in legs at rest, [ ]  Pain in legs at night,  [ ]  Non-healing ulcer, [ ]  Blood clot in vein/DVT,   Pulmonary: [ ]  Home oxygen, [ ]  Productive cough, [ ]  Coughing up blood, [ ]  Asthma,  [ ]  Wheezing [ ]  COPD Musculoskeletal:  [x ] Arthritis, [x ] Low back pain, [ ]  Joint pain Hematologic: [ ]  Easy Bruising, [ ]  Anemia; [ ]  Hepatitis Gastrointestinal: [ ]  Blood in stool, [ ]  Gastroesophageal Reflux/heartburn, [x]  tenderness to palpation Urinary: [ ]  chronic Kidney disease, [ ]  on HD - [ ]  MWF or [ ]  TTHS, [ ]  Burning with urination, [ ]  Difficulty  urinating Skin: [ ]  Rashes, [ ]  Wounds Psychological: [ ]  Anxiety, [ ]  Depression  Physical Examination Vitals:   12/20/19 1454  BP: (!) 169/123  Pulse: (!) 101  Resp: (!) 24  Temp: 98.8 F (37.1 C)  TempSrc: Oral  SpO2: 97%   There is no height or weight on file to calculate BMI.  General:  WDWN in NAD Gait: Normal HENT: WNL Eyes: Pupils equal  Pulmonary: normal non-labored breathing , without Rales, rhonchi,  wheezing Cardiac: Irregularly, irregular new A fib  Abdomen: soft, TT palpation over AAA, no masses, multiple c section scars, umbilical hernia repair Skin: no rashes, ulcers noted;  no Gangrene , no cellulitis; no open wounds;   Vascular Exam/Pulses:Palpable radial, femoral, popliteal and PT pulses B   Musculoskeletal: no muscle wasting or atrophy; no edema  Neurologic: A&O X 3; Appropriate Affect ;  SENSATION: normal; MOTOR FUNCTION: 5/5 Symmetric Speech is fluent/normal   Significant Diagnostic Studies: CBC Lab Results  Component Value Date   WBC 8.3 09/14/2018   HGB 12.2 09/14/2018   HCT 34.4 (L) 09/14/2018   MCV 84.3 09/14/2018   PLT 297 09/14/2018    BMET    Component Value Date/Time   NA 135 09/17/2018 0545   NA 137 03/16/2015 0000   K 4.4 09/17/2018 0545   CL 101 09/17/2018 0545   CO2 25 09/17/2018 0545   GLUCOSE 84 09/17/2018 0545   BUN 12 09/17/2018 0545   BUN 25 (A) 03/16/2015 0000   CREATININE 1.01 (H) 09/17/2018 0545   CREATININE 0.98 (H) 01/29/2016 1528   CALCIUM 9.0 09/17/2018 0545   GFRNONAA 51 (L) 09/17/2018 0545   GFRAA 60 (L) 09/17/2018 0545   CrCl cannot be calculated (Patient's most recent lab result is older than the maximum 21 days allowed.).  COAG Lab Results  Component Value Date   INR 1.1 01/29/2016   INR 1.30 04/07/2015   INR 1.19 03/06/2015     Non-Invasive Vascular Imaging:    CTA Abd/chest 12/20/19  IMPRESSION: 1. Large Infrarenal Abdominal Aortic Aneurysm measuring up to 5.7 cm with mild  perianeurysmal inflammation. Differential considerations include impending AAA rupture, mycotic AAA, symptomatic AAA. Recommend urgent transfer to emergency department and Vascular Surgery consultation. Aortic aneurysm NOS (ICD10-I71.9). Aortic Atherosclerosis (ICD10-I70.0).  2. No other acute or inflammatory process identified. 5 cm fat containing left pelvic hernia. Large bowel diverticulosis.  3. Simple appearing 4 cm left ovarian cyst, recommend follow-up Ultrasound in 6-12 months.  Critical Value/emergent results were called by telephone at the time of interpretation on 12/20/2019 at 2:23 pm to provider NP Hyatt in the office of Dr. [name] who verbally acknowledged these results.   ASSESSMENT/PLAN:  Expanding unstable AAA measured at 5.7 cm with striation and inflammation on CTA.  Her daughter is a bedside and they both agree to surgery.. Plan hospitalist admission, Dr. Einar Gip was notified he follows her AAA on an out patient basis and she is now in new Afib with hypertension.  EVAR repair of AAA today by Dr. Monica Martinez.   Lisa [facility]ure 12/20/2019 3:07 PM   I have seen and evaluated the patient. I agree with the PA note as documented above.  86 year old female presented to the ED after CT scan from her primary care doctor with concern for stranding around a large infrarenal abdominal aortic aneurysm.  She describes about 1 week of abdominal pain radiating into her back that has been constant.  CT shows a 5.7 cm infrarenal abdominal aortic aneurysm.  She has fair amount of tenderness over the aneurysm and suspect this is the cause of her symptoms.  I recommended urgent repair.  She is n.p.o.  Covid test is pending.  Dr. Einar Gip is going to see her for her new onset A. fib.  I discussed endovascular stent graft repair with the patient and her daughter.  We talked about risks of endoleak, vessel injury, risk of anesthesia etc.  She does have a fairly angulated  neck but I discussed that our current device I think we should be able to get seal here.    Lisa Heck, MD [facility] [place] Office: [phone]

## 2019-12-20 NOTE — ED Provider Notes (Signed)
[facility] EMERGENCY DEPARTMENT Provider Note   CSN: [id number] Arrival date & time: 12/20/19  1450     History No chief complaint on file.   Lisa Mcclure is a 86 y.o. female.  Presents to ER with concern for AAA.  She reports over the past couple weeks she has had crampy lower abdominal pain but this worsened over the past couple days.  Intermittent initially, now relatively constant.  Went to primary care office that ordered a CT scan.  CT demonstrating enlarging AAA, findings concerning for impending rupture.  Patient states no change in abdominal pain since earlier today, but had some improvement after receiving Percocet.  Denies prior hx afib, not on blood thinners.   Completed chart review, ultrasound September 2020 with 4.5 cm diameter AAA.  CT scan showing today: "IMPRESSION: 1. Large Infrarenal Abdominal Aortic Aneurysm measuring up to 5.7 cm with mild perianeurysmal inflammation. Differential considerations include impending AAA rupture, mycotic AAA, symptomatic AAA. Recommend urgent transfer to emergency department and Vascular Surgery consultation.  HPI     Past Medical History:  Diagnosis Date  . Anemia    h/o hemorrhoidal bleeding and blood transfusion  . Cardiomegaly   . Chronic diastolic CHF (congestive heart failure) ([place])   . Depression   . Diverticulosis   . DOE (dyspnea on exertion)   . Esophageal reflux   . GERD (gastroesophageal reflux disease)   . Hypothyroidism   . Insomnia   . LBP (low back pain)   . OAB (overactive bladder)   . Obesity   . OSA (obstructive sleep apnea)   . Osteoarthritis   . Rheumatoid arthritis(714.0)   . Shoulder pain, bilateral   . Unspecified essential hypertension   . Venous insufficiency     Patient Active Problem List   Diagnosis Date Noted  . Intolerance of continuous positive airway pressure (CPAP) ventilation 11/26/2019  . Severe hypoxemia 08/21/2019  . Severe obstructive sleep apnea  07/29/2019  . Chronic obstructive pulmonary disease ([place]) 07/29/2019  . RLS (restless legs syndrome) 07/29/2019  . Dehydration with hyponatremia 09/13/2018  . Bradycardia 09/13/2018  . Glaucoma 09/13/2018  . OA (osteoarthritis) of shoulder 05/16/2018  . Aortic stenosis, mild 01/15/2016  . Bilateral carotid bruits 05/11/2015  . Hyponatremia 04/19/2015  . Chronic anemia 04/19/2015  . Panic attack 04/19/2015  . Constipation 04/19/2015  . Hyperkalemia 04/17/2015  . Influenza A 04/16/2015  . Proximal humerus fracture   . Left patella fracture   . Benign essential HTN   . Esophageal reflux   . Chronic diastolic heart failure ([place])   . Hypothyroidism   . Lower GI bleed 04/08/2015  . Hematochezia   . Rectal bleeding 04/03/2015  . Patellar fracture 03/06/2015  . Left humeral fracture 11/04/2013  . Rib fracture 11/04/2013  . Hypothyroid 11/04/2013  . Hypertension 11/04/2013  . Right shoulder pain 11/04/2013  . OSA (obstructive sleep apnea) 11/13/2008  . BOOP (bronchiolitis obliterans with organizing pneumonia) ([place]) 08/25/2008    Past Surgical History:  Procedure Laterality Date  . APPENDECTOMY  1953  . bladder abduction-1996  1996  . breast biopsy Right 1980  . [place]  . COSMETIC SURGERY  1996  . CYSTOCELE REPAIR    . FLEXIBLE SIGMOIDOSCOPY N/A 04/10/2015   Procedure: FLEXIBLE SIGMOIDOSCOPY;  Surgeon: Arta Silence, MD;  Location: [facility] ENDOSCOPY;  Service: Endoscopy;  Laterality: N/A;  . knee arthroscopy Right 1996, 2010  . KNEE ARTHROSCOPY W/ AUTOGENOUS CARTILAGE IMPLANTATION (  ACI) PROCEDURE Left 1994, 1995  . SHOULDER SURGERY  1990  . TOTAL ABDOMINAL HYSTERECTOMY  1972  . VESICOVAGINAL FISTULA CLOSURE W/ TAH    . WRIST SURGERY  1967     OB History   No obstetric history on file.     Family History  Problem Relation Age of Onset  . COPD Father   . Lung cancer Mother   . Heart attack Maternal Grandmother 80  . Breast cancer Maternal  Aunt   . Lung cancer Son     Social History   Tobacco Use  . Smoking status: Former Smoker    Packs/day: 0.20    Years: 10.00    Pack years: 2.00    Types: Cigarettes    Quit date: 04/11/1984    Years since quitting: 35.7  . Smokeless tobacco: Never Used  Vaping Use  . Vaping Use: Never used  Substance Use Topics  . Alcohol use: Yes    Alcohol/week: 0.0 standard drinks    Comment: cocktail once a week  . Drug use: No    Home Medications Prior to Admission medications   Medication Sig Start Date End Date Taking? Authorizing Provider  Bempedoic Acid-Ezetimibe (NEXLIZET) 180-10 MG TABS Take 1 capsule by mouth daily. 02/11/19   Adrian Prows, MD  carvedilol (COREG) 12.5 MG tablet Take 1 tablet (12.5 mg total) by mouth 2 (two) times daily. Patient taking differently: Take 6.25 mg by mouth 2 (two) times daily.  09/19/19   Adrian Prows, MD  Cholecalciferol (VITAMIN D3) 125 MCG (5000 UT) CAPS Take 5,000 Units by mouth daily with breakfast.    [provider]  diclofenac sodium (VOLTAREN) 1 % GEL Apply 2.25 g topically 3 (three) times daily as needed. 10/23/18   [provider]  fluticasone (FLONASE) 50 MCG/ACT nasal spray Place 2 sprays into both nostrils daily as needed for allergies or rhinitis.  05/08/17   [provider]  furosemide (LASIX) 20 MG tablet Take 1 tablet (20 mg total) by mouth daily. 02/01/16   Dunn, Nedra Hai, PA-C  hydrALAZINE (APRESOLINE) 25 MG tablet TAKE 1 TABLET 3 TIMES A DAY Patient taking differently: Take 25 mg by mouth 3 (three) times daily.  05/08/19   Miquel Dunn, NP  HYDROcodone-acetaminophen (NORCO) 7.5-325 MG tablet Take 1 tablet by mouth every 6 (six) hours as needed for moderate pain. 04/13/15   Barton Dubois, MD  ipratropium (ATROVENT) 0.03 % nasal spray Place 2 sprays into both nostrils 3 (three) times daily. 11/22/19   [provider]  isosorbide dinitrate (ISORDIL) 30 MG tablet Take 30 mg by mouth 3 (three) times daily.     [provider]  levothyroxine (SYNTHROID) 125 MCG tablet Take 125 mcg by mouth daily before breakfast.     [provider]  losartan (COZAAR) 100 MG tablet TAKE 1 TABLET(100 MG) BY MOUTH TWICE DAILY 12/03/19   Adrian Prows, MD  omeprazole (PRILOSEC) 20 MG capsule Take 20 mg by mouth 2 (two) times daily before a meal.     [provider]  rOPINIRole (REQUIP) 1 MG tablet Take 1 mg by mouth See admin instructions. Take 1 mg by mouth once a day between 3 PM-4 PM and an additional 1 mg in the evening, if no relief 06/19/15   [provider]    Allergies    Statins, Gabapentin, and Methocarbamol  Review of Systems   Review of Systems  Constitutional: Negative for chills and fever.  HENT: Negative for ear  pain and sore throat.   Eyes: Negative for pain and visual disturbance.  Respiratory: Negative for cough and shortness of breath.   Cardiovascular: Negative for chest pain and palpitations.  Gastrointestinal: Positive for abdominal pain. Negative for vomiting.  Genitourinary: Negative for dysuria and hematuria.  Musculoskeletal: Negative for arthralgias and back pain.  Skin: Negative for color change and rash.  Neurological: Negative for seizures and syncope.  All other systems reviewed and are negative.   Physical Exam Updated Vital Signs  BP (!) 169/123 (BP Location: Right Arm)   Pulse (!) 140   Temp 98.8 F (37.1 C) (Oral)   Resp (!) 24   SpO2 97%   Physical Exam Vitals and nursing note reviewed.  Constitutional:      General: She is not in acute distress.    Appearance: She is well-developed.  HENT:     Head: Normocephalic and atraumatic.  Eyes:     Conjunctiva/sclera: Conjunctivae normal.  Cardiovascular:     Rate and Rhythm: Tachycardia present. Rhythm irregular.     Heart sounds: No murmur heard.   Pulmonary:     Effort: Pulmonary effort is normal. No respiratory distress.     Breath sounds: Normal breath sounds.  Abdominal:      Palpations: Abdomen is soft. There is no mass.     Hernia: No hernia is present.     Comments: Tenderness over mid to lower abdomen, no rebound or guarding  Musculoskeletal:        General: No deformity or signs of injury.     Cervical back: Neck supple.  Skin:    General: Skin is warm and dry.  Neurological:     General: No focal deficit present.     Mental Status: She is alert and oriented to person, place, and time.     ED Results / Procedures / Treatments   Labs (all labs ordered are listed, but only abnormal results are displayed) Labs Reviewed  CBC WITH DIFFERENTIAL/PLATELET - Abnormal; Notable for the following components:      Result Value   WBC 12.7 (*)    Hemoglobin 11.7 (*)    Neutro Abs 9.8 (*)    Monocytes Absolute 1.3 (*)    All other components within normal limits  PROTIME-INR - Abnormal; Notable for the following components:   Prothrombin Time 15.8 (*)    INR 1.3 (*)    All other components within normal limits  BASIC METABOLIC PANEL - Abnormal; Notable for the following components:   Sodium 131 (*)    Chloride 95 (*)    Glucose, Bld 105 (*)    Calcium 8.8 (*)    All other components within normal limits  HEPATIC FUNCTION PANEL - Abnormal; Notable for the following components:   Albumin 3.1 (*)    AST 46 (*)    All other components within normal limits  SARS CORONAVIRUS 2 BY RT PCR (HOSPITAL ORDER, [facility])  APTT  LIPASE, BLOOD  LACTIC ACID, PLASMA  LACTIC ACID, PLASMA  URINALYSIS, ROUTINE W REFLEX MICROSCOPIC  TYPE AND SCREEN  TROPONIN I (HIGH SENSITIVITY)    EKG EKG Interpretation  Date/Time:  Friday December 20 2019 15:16:24 EDT Ventricular Rate:  158 PR Interval:    QRS Duration: 81 QT Interval:  294 QTC Calculation: 477 R Axis:   42 Text Interpretation: Atrial fibrillation with rapid V-rate ST depression, probably rate related Confirmed by Madalyn Rob [phone]) on 12/20/2019 3:36:49 PM   Radiology  CT  ABDOMEN PELVIS W CONTRAST  Result Date: 12/20/2019 CLINICAL DATA:  86 year old female with lower abdominal cramping. Status post COVID-19 3 weeks ago. Fully vaccinated. EXAM: CT ABDOMEN AND PELVIS WITH CONTRAST TECHNIQUE: Multidetector CT imaging of the abdomen and pelvis was performed using the standard protocol following bolus administration of intravenous contrast. CONTRAST:  11mL OMNIPAQUE IOHEXOL 300 MG/ML  SOLN COMPARISON:  Chest CT 03/17/2016. FINDINGS: Lower chest: Calcified aortic atherosclerosis. Cardiac size at the upper limits of normal. No pericardial or pleural effusion. Negative lung bases aside from mild scarring/atelectasis. Hepatobiliary: Possible hepatic steatosis but otherwise negative liver and gallbladder. Pancreas: Atrophied, otherwise negative. Spleen: Negative. Adrenals/Urinary Tract: Normal adrenal glands. Symmetric renal enhancement and contrast excretion. Negative ureters. Diminutive and unremarkable urinary bladder. Stomach/Bowel: Diverticulosis and retained stool throughout the distal large bowel. Oral contrast has reached the transverse colon. Diverticulosis of the right colon. No large bowel inflammation. Negative terminal ileum. Diminutive or absent appendix. No dilated small bowel. Decompressed stomach. No free air. No free fluid. Vascular/Lymphatic: Aortoiliac calcified atherosclerosis. There is an infrarenal abdominal aortic aneurysm, diameter up to 5.7 cm. Bulky mural plaque or thrombus. The IMA remains patent. There is mild soft tissue inflammation along the ventral and left lateral margin of the aneurysm as seen on series 2, image 45 and coronal image 50. Other major vascular structures in the abdomen and pelvis are patent. Portal venous system appears to be patent. Reproductive: Surgically absent uterus. Simple fluid density 4 cm left ovarian cyst. No surrounding inflammation. Diminutive or absent right ovary. Other: No pelvic free fluid. There is a 5-6 cm fat containing  left pelvic hernia. Burtis Junes this is an inguinal (coronal image 84) rather than femoral hernia. No associated inflammation. No herniated bowel. Musculoskeletal: Osteopenia. No acute osseous abnormality identified. IMPRESSION: 1. Large Infrarenal Abdominal Aortic Aneurysm measuring up to 5.7 cm with mild perianeurysmal inflammation. Differential considerations include impending AAA rupture, mycotic AAA, symptomatic AAA. Recommend urgent transfer to emergency department and Vascular Surgery consultation. Aortic aneurysm NOS (ICD10-I71.9). Aortic Atherosclerosis (ICD10-I70.0). 2. No other acute or inflammatory process identified. 5 cm fat containing left pelvic hernia. Large bowel diverticulosis. 3. Simple appearing 4 cm left ovarian cyst, recommend follow-up Ultrasound in 6-12 months. Critical Value/emergent results were called by telephone at the time of interpretation on 12/20/2019 at 2:23 pm to provider NP Hyatt in the office of Dr. [name] who verbally acknowledged these results. Electronically Signed   By: Genevie Ann M.D.   On: 12/20/2019 14:26   HYBRID OR IMAGING ([facility] ONLY)  Result Date: 12/20/2019 There is no interpretation for this exam.  This order is for images obtained during a surgical procedure.  Please See "Surgeries" Tab for more information regarding the procedure.    Procedures .Critical Care Performed by: Lucrezia Starch, MD Authorized by: Lucrezia Starch, MD   Critical care provider statement:    Critical care time (minutes):  42   Critical care was necessary to treat or prevent imminent or life-threatening deterioration of the following conditions:  Cardiac failure (AAA, afib)   Critical care was time spent personally by me on the following activities:  Discussions with consultants, evaluation of patient's response to treatment, examination of patient, ordering and performing treatments and interventions, ordering and review of laboratory studies, ordering and review of  radiographic studies, pulse oximetry, re-evaluation of patient's condition, obtaining history from patient or surrogate and review of old charts   (including critical care time)  Medications Ordered in ED Medications  diltiazem (  CARDIZEM) 125 mg in dextrose 5% 125 mL (1 mg/mL) infusion (5 mg/hr Intravenous New Bag/Given 12/20/19 1607)  sodium chloride 0.9 % bolus 1,000 mL (1,000 mLs Intravenous New Bag/Given 12/20/19 1550)  fentaNYL (SUBLIMAZE) injection 50 mcg (50 mcg Intravenous Given 12/20/19 1548)  diltiazem (CARDIZEM) 1 mg/mL load via infusion 25 mg (25 mg Intravenous Bolus from Bag 12/20/19 1607)    ED Course  I have reviewed the triage vital signs and the nursing notes.  Pertinent labs & imaging results that were available during my care of the patient were reviewed by me and considered in my medical decision making (see chart for details).  Clinical Course as of Dec 20 1635  Fri Dec 20, 2019  1531 Clark aware of patient, will come see, likely repair later today   [RD]  1554 Discussed with Ganji -    [RD]  1614 D/w chatterje, she will review with vascular regarding appropriateness of hospitalist admission vs admission to vascular as primary   [RD]  [facility] informed me Dr. Carlis Abbott will admit and if hospitalist needed post operatively, they will reach out at that time   [RD]    Clinical Course User Index [RD] Lucrezia Starch, MD   MDM Rules/Calculators/A&P                          86 year old lady presents to ER with lower abdominal pain.  Outpatient CT scan concerning for enlarged AAA with findings concerning for impending rupture.  On arrival to ER patient is well-appearing with a stable blood pressure, noted to be in new onset atrial fibrillation with RVR.  Patient denies prior history, not on anticoagulation.  Vascular surgery came to bedside and will take patient to OR for repair.  Patient followed by Dr. Einar Gip outpatient, reviewed the case with him, he or one of his  partners will consult on patient and recommend starting diltiazem, bolus followed by infusion.  If patient remains hypertensive despite the diltiazem, would consider IV hydralazine as needed.  Both vascular and cardiology have requested medicine admit.  Vascular surgery stated that from their standpoint patient does not need any sort of ICU monitoring postoperatively assuming everything goes well.  Consulted hospitalist service to admit.  Dr. Levy Pupa informed me that vascular will admit as primary.   Final Clinical Impression(s) / ED Diagnoses Final diagnoses:  Abdominal aortic aneurysm (AAA) without rupture ([facility])  Atrial fibrillation with RVR ([place])    Rx / DC Orders ED Discharge Orders    None       Lucrezia Starch, MD 12/20/19 [phone]

## 2019-12-20 NOTE — Anesthesia Preprocedure Evaluation (Addendum)
Anesthesia Evaluation  Patient identified by MRN, date of birth, ID band Patient awake    Reviewed: Allergy & Precautions, NPO status , Patient's Chart, lab work & pertinent test results  Airway Mallampati: III  TM Distance: <3 FB Neck ROM: Full    Dental  (+) Missing,    Pulmonary sleep apnea , COPD, former smoker,    breath sounds clear to auscultation       Cardiovascular hypertension, Pt. on home beta blockers and Pt. on medications +CHF   Rhythm:Regular Rate:Normal     Neuro/Psych PSYCHIATRIC DISORDERS Anxiety Depression negative neurological ROS     GI/Hepatic Neg liver ROS, GERD  Medicated,  Endo/Other  Hypothyroidism   Renal/GU negative Renal ROS     Musculoskeletal  (+) Arthritis ,   Abdominal (+) + obese,   Peds  Hematology   Anesthesia Other Findings   Reproductive/Obstetrics                            Anesthesia Physical Anesthesia Plan  ASA: IV and emergent  Anesthesia Plan: General   Post-op Pain Management:    Induction: Intravenous  PONV Risk Score and Plan: 4 or greater and Ondansetron, Dexamethasone and Treatment may vary due to age or medical condition  Airway Management Planned: Oral ETT  Additional Equipment: Arterial line  Intra-op Plan:   Post-operative Plan: Possible Post-op intubation/ventilation  Informed Consent: I have reviewed the patients History and Physical, chart, labs and discussed the procedure including the risks, benefits and alternatives for the proposed anesthesia with the patient or authorized representative who has indicated his/her understanding and acceptance.     Dental advisory given  Plan Discussed with: CRNA  Anesthesia Plan Comments: (Echo:  Left ventricle cavity is normal in size. Severe concentric hypertrophy of  the left ventricle. Normal LV systolic function with visual EF 50-55%.  Normal global wall motion. Doppler  evidence of grade I (impaired)  diastolic dysfunction, normal LAP. Calculated EF 47%.  Left atrial cavity is mildly dilated.  Trileaflet aortic valve with mild calcification of the aortic valve  annulus and leaflets. Mild aortic valve stenosis. Aortic valve mean  gradient of 12 mmHg, Vmax of 2.3 m/s. Calculated aortic valve area by  continuity equation is 1.3 cm. Trace aortic regurgitation.  Mild (Grade I) mitral regurgitation.  Mild tricuspid regurgitation. Estimated pulmonary artery systolic pressure  is 25 mmHg.  No significant change compared to previous study on 09/27/2017.)       Anesthesia Quick Evaluation

## 2019-12-20 NOTE — Op Note (Addendum)
Date: December 20, 2019  Preoperative diagnosis: Symptomatic 5.7 cm infrarenal abdominal aortic aneurysm  Postoperative diagnosis: Same  Procedure: 1.  Ultrasound-guided access of bilateral common femoral arteries for delivery of endograft and percutaneous closure 2.  Aortogram including catheter selection of aorta and bilateral iliac arteriogram 3.  Endovascular repair of infrarenal abdominal aortic aneurysm with bifurcated stent graft (26 mm x 14 x 12 main body, right bell bottom with a 20 mm x 10 cm piece, and left bell bottom with a 16 mm x 12 cm piece)  Surgeon: Dr. Marty Heck, MD  Assistant: Roxy Horseman, PA  Indications: Patient is an 86 year old female who presented to the ED today after undergoing a CT scan at her primary care office for evaluation of abdominal pain x 1 week.  Ultimately she was found to have a large 5.7 cm infrarenal abdominal aortic aneurysm with stranding.  On exam she had exquisite tenderness over the aneurysm suggesting that this was likely symptomatic aneurysm with impending rupture.  She presents for repair after risk benefits discussed.  An assistant was needed for sheath exchange and for the complexity of the case.  Findings: 26 mm x 14 x 12 Gore main body deployed just below the lowest left renal artery in a fairly angulated neck.  Ultimately after gate cannulation the left iliac was treated with a 16 mm bell bottom and the right iliac was treated with a 20 mm bell bottom.  Both hypogastric arteries were preserved.  Final aortogram showed exclusion of the aneurysm with stent graft with preserved bilateral renal arteries and no evidence of either proximal or distal endoleak or delayed endoleak.  Anesthesia: General  Details: Patient was taken to the operating room emergently given a prolonged delay in the ED for Covid test and findings of a symptomatic abdominal aortic aneurysm.  She was placed on operative table in supine position.  General  endotracheal anesthesia was induced.  She got preoperative antibiotics.  Timeout was performed to identify patient, procedure and site.  Initially evaluated the right common femoral artery with ultrasound, it was patent and image was saved.  This was accessed with a micro access needle placed a microwire and then a micro sheath after making a stab incision around her wire to dilate a track.  I then placed a Bentson wire into the infrarenal aorta and over the Bentson wire deployed two Perclose devices in the right common femoral artery at 10:00 and 2:00.  That point in time a short 8 French sheath was placed in the right common femoral artery.  I then did the same step in the left groin again evaluating the artery with ultrasound it was patent and image was saved.  Again the left common femoral artery was accessed with a micro access needle placed a microwire made a stab incision around this dissected down to create a track.  Again placed a 8 [place] dilator with a Bentson wire deployed two additional Perclose devices at 10:00 and 2:00.  Patient was then given approximately 10,000 units of IV heparin given we did not have a weight.  We then checked an ACT and additional heparin was given throughout the case to get her ACT above 250.  I then used KMP's to exchanged for stiff Amplatz wires through both sheaths.  We then upsized to a 16 [place] Gore dry seal sheath in the right common femoral artery and 12 French Gore dry seal sheath in the left common femoral artery.  We then placed  our main body device up approximate to the level of L1 through the 16 French right groin sheath.  We then placed a pigtail catheter up the left groin sheath and aortogram was obtained marking the lowest renal artery on the left.  We then deployed the first stage of the Gore conformable graft in a fairly angulated neck.  Once we were satisfied with this I did shoot one additional aortogram to ensure that we had not moved the graft and  preserved both renal arteries.  Ultimately the graft was deployed down until the gate was deployed.  I then came from the left groin with a Bentson wire and a KMP catheter that was buddy wired while leaving the stiff wire up and was able to cannulate the gate successfully.  I then put a long Rosen wire up and exchanged for a pigtail catheter after we had successfully cannulated the gate spun the catheter in the main body of the graft to ensure we were indeed in the graft which we were.  That point time we then put a marker pigtail up pulled the left groin 12 French sheath back and got a retrograde iliac shot to idetnify the hypogastric artery and then selected a 16 mm x 12 cm left iliac bell bottom that was deployed preserving the iliac artery bifurcation.  We then went and finished deploying the main body on the right and then also got a retrograde sheath shot after pulling the 16 French sheath back and then selected a 20 mm x 10 cm bell bottom on the right that was deployed.  Both the proximal distal and all overlapping components were then ballooned with a MOB balloon.  We put a pigtail catheter back up and shot one final aortogram that showed exclusion of the aneurysm with preservation of both renal arteries and flow only within the stent graft with no endoleak.  We then exchanged for a Bentson wires in both groins and initially in the right groin the 16 French sheath was removed while tying down her Perclose devices at 10:00 and 2:00 with good hemostasis.  We then held to manual pressure here and then went to the left groin and did the same thing pulling out the 12 French sheath and tied down the Perclose at 10:00 and 2:00.  Checked the feet and had posterior tibial signals.  Patient is given 50 mg protamine.  We did hold manual pressure for about another 5 minutes good hemostasis and the groin stab incisions were closed with 4-0 Monocryl subcuticular stitch and Dermabond.  She was awakened from anesthesia and  taken to PACU in stable condition.  Complication: None  Condition: Stable  Marty Heck, MD [facility] Office: [place]

## 2019-12-20 NOTE — Progress Notes (Signed)
Patient continues to be in afib 115-130.  Cardizem gtt at 15mg /hr.  BP 134/95 sO2 95% on 4L simple mask.  Patient pain free and resting comfortably.  Paged [facility] Cardiology X 2 with no reply.  Spoke with Dr. Gailen Shelter and he is okay to discharge from PACU to unit.  Will continue to monitor.

## 2019-12-20 NOTE — Anesthesia Postprocedure Evaluation (Signed)
Anesthesia Post Note  Patient: Lisa Mcclure  Procedure(s) Performed: Aortogram including catheter selection of aorta and bilateral iliac arteriogram, Endovascular repair of infrarenal abdominal aortic aneurysm with bifurcated stent graft (26 mm x 14 x 12 main body, right bell bottom with a 20 mm x 10 cm piece, and left bell bottom with a 16 mm x 12 cm piece)  (N/A ) Ultrasound-guided access of bilateral common femoral arteries for delivery of endograft and percutaneous closure (Bilateral Groin)     Patient location during evaluation: PACU Anesthesia Type: General Level of consciousness: awake and alert Pain management: pain level controlled Vital Signs Assessment: post-procedure vital signs reviewed and stable Respiratory status: spontaneous breathing, nonlabored ventilation, respiratory function stable and patient connected to nasal cannula oxygen Cardiovascular status: blood pressure returned to baseline and stable Postop Assessment: no apparent nausea or vomiting Anesthetic complications: no   No complications documented.  Last Vitals:  Vitals:   12/20/19 2130 12/20/19 2145  BP: 113/77 (!) 134/95  Pulse: (!) 59 (!) 113  Resp: (!) 23 (!) 22  Temp:    SpO2: 95% 97%    Last Pain:  Vitals:   12/20/19 2145  TempSrc:   PainSc: 0-No pain                 Tiajuana Amass

## 2019-12-20 NOTE — Progress Notes (Signed)
Spoke with Janene Madeira NP with infection prevention.  Patient was diagnosed with mild Covid with minor symptoms prior to 8/31.  Patient received monoclonal antibody injection on 8/31 (see note in Epic).  Per infection prevention, since patient is currently asymptomatic, contact isolation is not required for Covid.

## 2019-12-20 NOTE — Anesthesia Procedure Notes (Signed)
Procedure Name: Intubation Date/Time: 12/20/2019 6:01 PM Performed by: Griffin Dakin, CRNA Pre-anesthesia Checklist: Patient identified, Emergency Drugs available, Suction available and Patient being monitored Patient Re-evaluated:Patient Re-evaluated prior to induction Oxygen Delivery Method: Circle system utilized Preoxygenation: Pre-oxygenation with 100% oxygen Induction Type: IV induction Ventilation: Mask ventilation without difficulty Laryngoscope Size: Mac and 3 Grade View: Grade II Tube type: Oral Tube size: 7.0 mm Number of attempts: 1 Airway Equipment and Method: Stylet and Oral airway Placement Confirmation: ETT inserted through vocal cords under direct vision,  positive ETCO2 and breath sounds checked- equal and bilateral Secured at: 22 cm Tube secured with: Tape Dental Injury: Teeth and Oropharynx as per pre-operative assessment

## 2019-12-20 NOTE — Consult Note (Signed)
CARDIOLOGY CONSULT NOTE  [name] MRN: [id number] DOB/AGE: [birth year] 86 y.o.  Admit date: 12/20/2019 Referring Physician  Madalyn Rob, MD Primary Physician:  [name], MD Reason for Consultation  A. Fib with RVR and AAA  [name], female    DOB: [birth year], 86 y.o.   MRN: [id number]  CC: Abdominal discomfort.  HPI:    Lisa Mcclure  is a 86 y.o. female  with past medical history of obesity, chronic diastolic CHF, hypertensive heart disease, anemia, hemorrhoids, depression, diverticulosis, esophageal reflux, OSA intolerant to CPAP on nocturnal 02, rheumatoid arthritis, and venous insufficiency. CT scan of the chest 03/2016 without definitive ILD. LM and 2 vessel CAD, mild calcification on mitral and aortic valve. Also has  moderate sized AAA.  I been closely following her AAA, she was scheduled for a abdominal aortic duplex yesterday but canceled the appointment as she was having abdominal discomfort.  On the past 3 to 4 weeks, patient started experiencing abdominal discomfort, constipation and not feeling well.  It gradually progressed and started having severe nausea associated with this and she stopped all her medications and felt that her symptoms were related to constipation.  She was seen by her PCP this morning, with a history of AAA, underwent abdominal CT which revealed impending rupture and was sent over to the emergency room.  In the ED she was also found to be hypertensive and also in A. fib with RVR.  I was consulted to manage this.  Plans were for the patient to have surgical evaluation this evening on a urgent basis.  Dr. Fortunato Curling has evaluated the patient.  Past Medical History:  Diagnosis Date  . Anemia    h/o hemorrhoidal bleeding and blood transfusion  . Cardiomegaly   . Chronic diastolic CHF (congestive heart failure) ([place])   . Depression   . Diverticulosis   . DOE (dyspnea on exertion)   . Esophageal  reflux   . GERD (gastroesophageal reflux disease)   . Hypothyroidism   . Insomnia   . LBP (low back pain)   . OAB (overactive bladder)   . Obesity   . OSA (obstructive sleep apnea)   . Osteoarthritis   . Rheumatoid arthritis(714.0)   . Shoulder pain, bilateral   . Unspecified essential hypertension   . Venous insufficiency    Past Surgical History:  Procedure Laterality Date  . APPENDECTOMY  1953  . bladder abduction-1996  1996  . breast biopsy Right 1980  . [place]  . COSMETIC SURGERY  1996  . CYSTOCELE REPAIR    . FLEXIBLE SIGMOIDOSCOPY N/A 04/10/2015   Procedure: FLEXIBLE SIGMOIDOSCOPY;  Surgeon: Arta Silence, MD;  [facility] ENDOSCOPY;  Service: Endoscopy;  Laterality: N/A;  . knee arthroscopy Right 1996, 2010  . KNEE ARTHROSCOPY W/ AUTOGENOUS CARTILAGE IMPLANTATION (ACI) PROCEDURE Left 1994, 1995  . SHOULDER SURGERY  1990  . TOTAL ABDOMINAL HYSTERECTOMY  1972  . VESICOVAGINAL FISTULA CLOSURE W/ TAH    . WRIST SURGERY  1967   Social History   Tobacco Use  . Smoking status: Former Smoker    Packs/day: 0.20    Years: 10.00    Pack years: 2.00    Types: Cigarettes    Quit date: 04/11/1984    Years since quitting: 35.7  . Smokeless tobacco: Never Used  Substance Use Topics  . Alcohol use: Yes    Alcohol/week: 0.0 standard drinks    Comment:  cocktail once a week    Marital Sttus: Widowed  ROS  Review of Systems  Constitutional: Positive for malaise/fatigue.  Cardiovascular: Positive for dyspnea on exertion. Negative for chest pain and leg swelling.  Gastrointestinal: Positive for bloating, abdominal pain, constipation and nausea. Negative for melena.  All other systems reviewed and are negative.  Objective   Vitals with BMI 12/20/2019 12/10/2019 12/10/2019  Height - - -  Weight - - -  BMI - - -  Systolic 253 664 403  Diastolic 474 259 87  Pulse 140 92 -    Blood pressure (!) 169/123, pulse (!) 140, temperature 98.8 F (37.1  C), temperature source Oral, resp. rate (!) 24, SpO2 97 %.    Physical Exam Constitutional:      General: She is not in acute distress.    Comments: Obese in no acute distress.  Cardiovascular:     Rate and Rhythm: Tachycardia present. Rhythm irregularly irregular.     Pulses:          Carotid pulses are 2+ on the right side and 2+ on the left side.      Dorsalis pedis pulses are 2+ on the right side and 2+ on the left side.       Posterior tibial pulses are 2+ on the right side and 2+ on the left side.     Heart sounds: Heart sounds are distant. Murmur heard.  Early systolic murmur is present with a grade of 2/6 at the upper right sternal border.  No gallop.      Comments: Femoral and popliteal pulse difficult to feel due to patient's body habitus.  No leg edema. JVD difficult to see due to short neck. Pulmonary:     Effort: Pulmonary effort is normal.     Breath sounds: Normal breath sounds.  Abdominal:     General: Bowel sounds are normal. There is no distension.     Palpations: Abdomen is soft.     Tenderness: There is no guarding.     Comments: Obese. Pannus present. Deep palpation not performed due to AAA  Neurological:     Mental Status: She is alert.    Laboratory examination:   Recent Labs    12/20/19 1520  NA 131*  K 4.2  CL 95*  CO2 24  GLUCOSE 105*  BUN 19  CREATININE 0.87  CALCIUM 8.8*  GFRNONAA >60  GFRAA >60   CrCl cannot be calculated (Unknown ideal weight.).  CMP Latest Ref Rng & Units 12/20/2019 09/17/2018 09/16/2018  Glucose 70 - 99 mg/dL 105(H) 84 88  BUN 8 - 23 mg/dL 19 12 14   Creatinine 0.44 - 1.00 mg/dL 0.87 1.01(H) 0.78  Sodium 135 - 145 mmol/L 131(L) 135 133(L)  Potassium 3.5 - 5.1 mmol/L 4.2 4.4 4.4  Chloride 98 - 111 mmol/L 95(L) 101 102  CO2 22 - 32 mmol/L 24 25 24   Calcium 8.9 - 10.3 mg/dL 8.8(L) 9.0 8.8(L)  Total Protein 6.5 - 8.1 g/dL 6.9 - -  Total Bilirubin 0.3 - 1.2 mg/dL 0.7 - -  Alkaline Phos 38 - 126 U/L 65 - -  AST 15 - 41  U/L 46(H) - -  ALT 0 - 44 U/L 41 - -   CBC Latest Ref Rng & Units 12/20/2019 09/14/2018 09/13/2018  WBC 4.0 - 10.5 K/uL 12.7(H) 8.3 7.3  Hemoglobin 12.0 - 15.0 g/dL 11.7(L) 12.2 11.7(L)  Hematocrit 36 - 46 % 37.5 34.4(L) 32.7(L)  Platelets 150 - 400 K/uL 399  297 272   Lipid Panel Recent Labs    03/14/19 1121  CHOL 126  TRIG 82  LDLCALC 61  HDL 49    HEMOGLOBIN A1C No results found for: HGBA1C, MPG TSH No results for input(s): TSH in the last 8760 hours. BNP (last 3 results) No results for input(s): BNP in the last 8760 hours.  Medications and allergies   Allergies  Allergen Reactions  . Statins Other (See Comments)    Muscle aches and INTERNAL BLEEDING  . Gabapentin Other (See Comments)    "Made me loopy"  . Methocarbamol Other (See Comments)    "Made me loopy"    No outpatient medications have been marked as taking for the 12/20/19 encounter [facility] Encounter).   Scheduled Meds: Continuous Infusions: . diltiazem (CARDIZEM) infusion 5 mg/hr (12/20/19 1607)   PRN Meds:.   No intake/output data recorded. No intake/output data recorded.   Radiology:   CT ABDOMEN PELVIS W CONTRAST Result Date: 12/20/2019  1. Large Infrarenal Abdominal Aortic Aneurysm measuring up to 5.7 cm with mild perianeurysmal inflammation. Differential considerations include impending AAA rupture, mycotic AAA, symptomatic AAA. Recommend urgent transfer to emergency department and Vascular Surgery consultation. Aortic aneurysm NOS (ICD10-I71.9). Aortic Atherosclerosis (ICD10-I70.0). 2. No other acute or inflammatory process identified. 5 cm fat containing left pelvic hernia. Large bowel diverticulosis. 3. Simple appearing 4 cm left ovarian cyst, recommend follow-up Ultrasound in 6-12 months.  Cardiac Studies:   Lexiscan myoview stress test 12/08/2017: 1. Lexiscan stress test was performed. Exercise capacity was not assessed. Stress symptoms included abdominal pain. Blood pressure was normal. The  resting and stress electrocardiogram demonstrated normal sinus rhythm, normal resting conduction, no resting arrhythmias and normal rest repolarization. 2. The overall quality of the study is good. There is no evidence of abnormal lung activity. Stress and rest SPECT images demonstrate homogeneous tracer distribution throughout the myocardium. Gated SPECT imaging reveals normal myocardial thickening and wall motion. The left ventricular ejection fraction was normal (54%). 3. Low risk study  Renal artery duplex 09/27/2017: Hemodynamically significant stenosis bilaterally. Renal length is within normal limits for both kidneys. There is an incidental 3.9x3.4x3.4 cm proximal and mid abdominal aortic aneurysm. Recommend dedicated abdominal aortic duplex scan to further define AAA.  Abdominal Aortic Duplex 01/01/2019: Moderate dilatation of the abdominal aorta is noted in the proximal, mid and distal aorta. An abdominal aortic aneurysm measuring 4.5 x 4.52 x 4.52 cm is seen. Recheck in 6 months for stability of the aneurysm.  Echocardiogram 01/25/2019: Left ventricle cavity is normal in size. Severe concentric hypertrophy of the left ventricle. Normal LV systolic function with visual EF 50-55%. Normal global wall motion. Doppler evidence of grade I (impaired) diastolic dysfunction, normal LAP. Calculated EF 47%. Left atrial cavity is mildly dilated. Trileaflet aortic valve with mild calcification of the aortic valve annulus and leaflets. Mild aortic valve stenosis. Aortic valve mean gradient of 12 mmHg, Vmax of 2.3 m/s. Calculated aortic valve area by continuity equation is 1.3 cm. Trace aortic regurgitation. Mild (Grade I) mitral regurgitation. Mild tricuspid regurgitation. Estimated pulmonary artery systolic pressure is 25 mmHg. No significant change compared to previous study on 09/27/2017.  EKG:  EKG 12/20/2019: Atrial fibrillation with rapid ventricular response at the rate of 158 bpm,  normal axis.  Nonspecific ST abnormality.  Compared to 01/08/2019, sinus rhythm with first-degree AV block without ischemia.  Assessment   1.  Atrial fibrillation with rapid ventricular response, new onset 2.  Chronic diastolic heart failure 3.  Primary hypertension 4.  Nocturnal hypoxemia on home oxygen at night 5.  Impending rupture of abdominal aortic aneurysm.  Surgical exploration this evening. 6.  COVID-19 infection, diagnosed 12/10/2019, patient is vaccinated, she has received monoclonal antibodies.  CHA2DS2-VASc Score is 5.  Yearly risk of stroke: 7.2% (A, F, HTN, CHF).  Score of 1=0.6; 2=2.2; 3=3.2; 4=4.8; 5=7.2; 6=9.8; 7=>9.8) -(CHF; HTN; vasc disease DM,  Female = 1; Age <65 =0; 65-74 = 1,  >75 =2; stroke/embolism= 2).   Medications Discontinued During This Encounter  Medication Reason  . diltiazem (CARDIZEM) 1 mg/mL load via infusion 15 mg     Recommendations:   Lisa Mcclure  is a 86 y.o. female  with past medical history of obesity, chronic diastolic CHF, hypertensive heart disease, anemia, hemorrhoids, depression, diverticulosis, esophageal reflux, OSA intolerant to CPAP on nocturnal 02, rheumatoid arthritis, and venous insufficiency. CT scan of the chest 03/2016 without definitive ILD. LM and 2 vessel CAD, mild calcification on mitral and aortic valve. Also has  moderate sized AAA.  Overall she is low risk from cardiac standpoint for perioperative cardiovascular events.  In spite of atrial fibrillation with rapid ventricular response, she is not in acute decompensated heart failure and she is not having any acute ischemic changes on the EKG as well.  She has new onset atrial fibrillation with rapid ventricular response.  Her blood pressure is also elevated.  We will start her on IV diltiazem, 25 mg bolus for 10 mg an hour.  She has discontinued all her cardiac medications in view of abdominal discomfort and nausea and vomiting.  There is plan for surgery this evening,  hopefully it will be endovascular repair of the AAA but may need open repair as well.  She was diagnosed with COVID-19 infection, on December 10, 2019.  She has received IV infusion of monoclonal antibody and also she is vaccinated in the past.  No clinical evidence of heart failure, there is no leg edema or JVD, heart rate is improving on diltiazem.  She may need combination of beta-blocker therapy and diltiazem for rate control.  And also blood pressure control.  I will monitor this closely.  She will need to be started on anticoagulation once stable from surgical standpoint in view of high cardioembolic risk.   Adrian Prows, MD, [facility] 12/20/2019, 4:27 PM Office: [phone]

## 2019-12-20 NOTE — Progress Notes (Signed)
Assessment done upon patient arrival in OR at 28

## 2019-12-20 NOTE — Progress Notes (Signed)
[facility] Cardiology answering service and left call back number.

## 2019-12-20 NOTE — Anesthesia Procedure Notes (Signed)
Arterial Line Insertion Start/End9/01/2020 6:10 PM, 12/20/2019 6:13 PM Performed by: Effie Berkshire, MD, anesthesiologist  Patient location: Pre-op. Preanesthetic checklist: patient identified, IV checked, site marked, risks and benefits discussed, surgical consent, monitors and equipment checked, pre-op evaluation, timeout performed and anesthesia consent Lidocaine 1% used for infiltration Left, radial was placed Catheter size: 20 Fr Hand hygiene performed  and maximum sterile barriers used   Attempts: 1 Procedure performed without using ultrasound guided technique. Following insertion, dressing applied and Biopatch. Post procedure assessment: normal and unchanged  Patient tolerated the procedure well with no immediate complications.

## 2019-12-21 LAB — CBC
HCT: 35 % — ABNORMAL LOW (ref 36.0–46.0)
Hemoglobin: 11 g/dL — ABNORMAL LOW (ref 12.0–15.0)
MCH: 28.6 pg (ref 26.0–34.0)
MCHC: 31.4 g/dL (ref 30.0–36.0)
MCV: 91.1 fL (ref 80.0–100.0)
Platelets: 318 10*3/uL (ref 150–400)
RBC: 3.84 MIL/uL — ABNORMAL LOW (ref 3.87–5.11)
RDW: 14 % (ref 11.5–15.5)
WBC: 15.3 10*3/uL — ABNORMAL HIGH (ref 4.0–10.5)
nRBC: 0 % (ref 0.0–0.2)

## 2019-12-21 LAB — BASIC METABOLIC PANEL
Anion gap: 12 (ref 5–15)
BUN: 13 mg/dL (ref 8–23)
CO2: 22 mmol/L (ref 22–32)
Calcium: 8.2 mg/dL — ABNORMAL LOW (ref 8.9–10.3)
Chloride: 102 mmol/L (ref 98–111)
Creatinine, Ser: 0.8 mg/dL (ref 0.44–1.00)
GFR calc Af Amer: >60 mL/min (ref >60)
GFR calc non Af Amer: >60 mL/min (ref >60)
Glucose, Bld: 146 mg/dL — ABNORMAL HIGH (ref 70–99)
Potassium: 4.5 mmol/L (ref 3.5–5.1)
Sodium: 136 mmol/L (ref 135–145)

## 2019-12-21 LAB — GLUCOSE, CAPILLARY
Glucose-Capillary: 163 mg/dL — ABNORMAL HIGH (ref 70–99)
Glucose-Capillary: 170 mg/dL — ABNORMAL HIGH (ref 70–99)
Glucose-Capillary: 166 mg/dL — ABNORMAL HIGH (ref 70–99)
Glucose-Capillary: 153 mg/dL — ABNORMAL HIGH (ref 70–99)

## 2019-12-21 LAB — HEPARIN LEVEL (UNFRACTIONATED): Heparin Unfractionated: 0.22 IU/mL — ABNORMAL LOW (ref 0.30–0.70)

## 2019-12-21 MED ORDER — METOPROLOL TARTRATE 25 MG PO TABS
25.0000 mg | ORAL_TABLET | Freq: Two times a day (BID) | ORAL | Status: DC
  Administered 2019-12-21 (×3): 25 mg via ORAL
  Filled 2019-12-21 (×3): qty 1

## 2019-12-21 MED ORDER — BEMPEDOIC ACID-EZETIMIBE 180-10 MG PO TABS
1.0000 | ORAL_TABLET | Freq: Every day | ORAL | Status: DC
  Administered 2019-12-22 – 2019-12-25 (×4): 1 via ORAL
  Filled 2019-12-21 (×4): qty 1

## 2019-12-21 MED ORDER — TRIAMCINOLONE ACETONIDE 40 MG/ML IJ SUSP
60.0000 mg | INTRAMUSCULAR | Status: DC | PRN
  Administered 2019-11-14: 60 mg via INTRA_ARTICULAR

## 2019-12-21 MED ORDER — BUPIVACAINE HCL 0.5 % IJ SOLN
3.0000 mL | INTRAMUSCULAR | Status: DC | PRN
  Administered 2019-11-14: 3 mL via INTRA_ARTICULAR

## 2019-12-21 MED ORDER — HEPARIN (PORCINE) 25000 UT/250ML-% IV SOLN
1400.0000 [IU]/h | INTRAVENOUS | Status: DC
  Administered 2019-12-21: 1100 [IU]/h via INTRAVENOUS
  Administered 2019-12-22: 1300 [IU]/h via INTRAVENOUS
  Administered 2019-12-23 – 2019-12-24 (×2): 1150 [IU]/h via INTRAVENOUS
  Administered 2019-12-25: 1300 [IU]/h via INTRAVENOUS
  Filled 2019-12-21 (×5): qty 250

## 2019-12-21 MED ORDER — HYDRALAZINE HCL 50 MG PO TABS
50.0000 mg | ORAL_TABLET | Freq: Three times a day (TID) | ORAL | Status: DC
  Administered 2019-12-21 – 2019-12-22 (×3): 50 mg via ORAL
  Filled 2019-12-21 (×4): qty 1

## 2019-12-21 NOTE — Progress Notes (Signed)
ANTICOAGULATION CONSULT NOTE - Initial Consult  Pharmacy Consult for heparin Indication: atrial fibrillation  Patient Measurements: Weight: 95.5 kg (210 lb 9.6 oz) Heparin Dosing Weight: 75.5 kg  Vital Signs: Temp: 97.6 F (36.4 C) (09/11 0828) Temp Source: Oral (09/11 0828) BP: 140/94 (09/11 0828) Pulse Rate: 75 (09/11 0828)  Labs: Recent Labs    12/20/19 1520 12/20/19 1520 12/20/19 1842 12/20/19 1842 12/20/19 2053 12/21/19 0137  HGB 11.7*   < > 10.5*   < > 10.8* 11.0*  HCT 37.5   < > 31.0*  --  34.6* 35.0*  PLT 399  --   --   --  343 318  APTT 33  --   --   --  136*  --   LABPROT 15.8*  --   --   --  18.2*  --   INR 1.3*  --   --   --  1.6*  --   CREATININE 0.87  --   --   --  0.73 0.80  TROPONINIHS 13  --   --   --  13  --    < > = values in this interval not displayed.    Estimated Creatinine Clearance: 58.2 mL/min (by C-G formula based on SCr of 0.8 mg/dL).   Medical History: Past Medical History:  Diagnosis Date  . Anemia    h/o hemorrhoidal bleeding and blood transfusion  . Cardiomegaly   . Chronic diastolic CHF (congestive heart failure) ([place])   . Depression   . Diverticulosis   . DOE (dyspnea on exertion)   . Esophageal reflux   . GERD (gastroesophageal reflux disease)   . Hypothyroidism   . Insomnia   . LBP (low back pain)   . OAB (overactive bladder)   . Obesity   . OSA (obstructive sleep apnea)   . Osteoarthritis   . Rheumatoid arthritis(714.0)   . Shoulder pain, bilateral   . Unspecified essential hypertension   . Venous insufficiency     Medications:  Infusions:  . sodium chloride    . sodium chloride 75 mL/hr at 12/21/19 0019  . diltiazem (CARDIZEM) infusion 5 mg/hr (12/21/19 0343)  . magnesium sulfate bolus IVPB      Assessment: 86 yo female presented abdominal aortic aneurysm repair s/p endovascular repair on 12/20/19. She has been cleared by vascular surgery to start heparin today. Pharmacy has been consulted to start heparin  without a bolus for atrial fibrillation.  Hemoglobin is low but stable, platelets wnl.   Goal of Therapy:  Heparin level 0.3-0.7 units/ml Monitor platelets by anticoagulation protocol: Yes   Plan:  Start heparin at 1100 units/hr Check heparin level in 8 hours Monitor daily heparin level, CBC Monitor for s/sx bleeding   Rebbeca Paul, PharmD PGY1 Pharmacy Resident 12/21/2019 11:31 AM

## 2019-12-21 NOTE — Progress Notes (Addendum)
Bladder scan performed on patient as Foley catheter was removed at 0645 and patient has not had any significant urine output. Bladder scan showed volume of 0 mL. External catheter is in place. Patient states that she has significant issues with urinary retention at baseline. She states that she has had multiple bladder surgeries. She states that her bladder never fully empties and that she has continuous bladder leakage throughout the day especially with coughing or position changes. She denies any feelings of bladder pain or fullness at this time.   Notified Dr. Carlis Abbott of the above. MD states to just monitor patient for now since she has retention at baseline. MD states that if she has not had any significant output by around 5 pm, can do in & out cath at that time.

## 2019-12-21 NOTE — Progress Notes (Addendum)
ON-CALL CARDIOLOGY 12/21/19  Patient's name: Lisa Mcclure.   MRN: [id number].    DOB: 1932/06/21 Primary care provider: [name], MD. Primary Cardiologist: Dr. Einar Gip  Interaction regarding this patient's care today: Patient underwent repair of her AAA and post-procedure she remained in Afib with elevated HR.  Cardiology was called for input.  Patient is transferred to the floor and resting comfortably per her nurse Paulette.  Her HR at that instance was 106bpm and per her nurse since on the floor HR ranges between 110-120bmp and SBP >163mmHg.  Recommended that she be given IV Lopressor 5mg  x 1 that was already been ordered as prn and in addition changed coreg (scheduled for am) to lopressor 25mg  po bid (first dose now).  Would recommend rate control approach for now.  Reached out to vascular surgery on call per AMION [phone]) to see when it would be safe to start IV Heparin for thromboembolic prophylaxis given the recent procedure. Awaiting call back.  Since she is not on [facility] will hold off rhythm control for now.  Continue telemetry.  Will follow.  Spoke to [place] and she is aware of the plan and is asked to reach out if she has any more questions tonight.   Today's Vitals   12/20/19 2145 12/20/19 2200 12/20/19 2215 12/20/19 2258  BP: (!) 134/95 (!) 136/57 (!) 152/90 121/83  Pulse: (!) 113 94 (!) 119 89  Resp: (!) 22 (!) 21 (!) 21 (!) 21  Temp:    98.1 F (36.7 C)  TempSrc:    Axillary  SpO2: 97% 100% 98% 99%  PainSc: 0-No pain 5      Thank-you.  Rex Kras, [place], [facility]  Pager: [phone] Office: [phone]

## 2019-12-21 NOTE — Progress Notes (Signed)
Progress Note  Patient Name: Lisa Mcclure Date of Encounter: 12/21/2019  Attending physician: Marty Heck, MD Primary care provider: [name], MD Primary Cardiologist: Dr. Adrian Prows Consultant:Katerra Ingman Terri Skains, DO, [facility]  Subjective: Lisa Mcclure is a 86 y.o. female who was seen and examined at bedside at approximately 1030am Patient is accompanied by her daughter Lisa Mcclure at bedside. No events overnight. Spoke to the patient's nurse who notes that her ventricular rate overnight was in the 70s. Cardizem drip has been weaned down to 5 mcg. Patient denies any abdominal pain or back pain. Patient denies chest pain or heart failure symptoms. Case discussed and reviewed with her nurse.  Objective: Vital Signs in the last 24 hours: Temp:  [97.6 F (36.4 C)-98.8 F (37.1 C)] 97.6 F (36.4 C) (09/11 0828) Pulse Rate:  [46-140] 75 (09/11 0828) Resp:  [15-38] 15 (09/11 0828) BP: (113-169)/(57-130) 140/94 (09/11 0828) SpO2:  [91 %-100 %] 95 % (09/11 0828) Arterial Line BP: (153-154)/(77-150) 153/77 (09/10 2100) Weight:  [95.5 kg] 95.5 kg (09/11 0500)  Intake/Output:  Intake/Output Summary (Last 24 hours) at 12/21/2019 1124 Last data filed at 12/21/2019 0700 Gross per 24 hour  Intake 2235.52 ml  Output 1800 ml  Net 435.52 ml    Net IO Since Admission: 435.52 mL [12/21/19 1124]  Weights:  Filed Weights   12/21/19 0500  Weight: 95.5 kg    Telemetry: Personally reviewed, atrial fibrillation with controlled ventricular rate.  Physical examination: PHYSICAL EXAM: Vitals with BMI 12/21/2019 12/21/2019 12/20/2019  Height - - -  Weight - 210 lbs 10 oz -  BMI - - -  Systolic [phone]  Diastolic 94 64 83  Pulse 75 55 89    CONSTITUTIONAL: Well-developed and well-nourished. No acute distress.  SKIN: Skin is warm and dry. No rash noted. No cyanosis. No pallor. No jaundice HEAD: Normocephalic and atraumatic.  EYES: No scleral icterus MOUTH/THROAT: Dry oral  membranes.  NECK: No JVD present. No thyromegaly noted. No carotid bruits  LYMPHATIC: No visible cervical adenopathy.  CHEST Normal respiratory effort. No intercostal retractions  LUNGS: Clear to auscultation bilaterally with decreased breath sounds at the bases.  No stridor. No wheezes. No rales.  CARDIOVASCULAR: Irregularly irregular, positive N8-G9, soft systolic ejection murmur heard at the upper right sternal border. ABDOMINAL: Obese, soft, nontender, nondistended, positive bowel sounds in all 4 quadrants, no apparent ascites.  EXTREMITIES: Trace bilateral pitting edema  HEMATOLOGIC: No significant bruising NEUROLOGIC: Oriented to person, place, and time. Nonfocal. Normal muscle tone.  PSYCHIATRIC: Normal mood and affect. Normal behavior. Cooperative  Lab Results: Hematology Recent Labs  Lab 12/20/19 1520 12/20/19 1520 12/20/19 1842 12/20/19 2053 12/21/19 0137  WBC 12.7*  --   --  17.0* 15.3*  RBC 4.15  --   --  3.80* 3.84*  HGB 11.7*   < > 10.5* 10.8* 11.0*  HCT 37.5   < > 31.0* 34.6* 35.0*  MCV 90.4  --   --  91.1 91.1  MCH 28.2  --   --  28.4 28.6  MCHC 31.2  --   --  31.2 31.4  RDW 13.9  --   --  13.9 14.0  PLT 399  --   --  343 318   < > = values in this interval not displayed.    Chemistry Recent Labs  Lab 12/20/19 1520 12/20/19 1520 12/20/19 1842 12/20/19 2053 12/21/19 0137  NA 131*   < > 135 135 136  K 4.2   < >  4.0 4.1 4.5  CL 95*  --   --  101 102  CO2 24  --   --  23 22  GLUCOSE 105*  --   --  105* 146*  BUN 19  --   --  14 13  CREATININE 0.87  --   --  0.73 0.80  CALCIUM 8.8*  --   --  8.3* 8.2*  PROT 6.9  --   --   --   --   ALBUMIN 3.1*  --   --   --   --   AST 46*  --   --   --   --   ALT 41  --   --   --   --   ALKPHOS 65  --   --   --   --   BILITOT 0.7  --   --   --   --   GFRNONAA >60  --   --  >60 >60  GFRAA >60  --   --  >60 >60  ANIONGAP 12  --   --  11 12   < > = values in this interval not displayed.     Cardiac  Enzymes: Cardiac Panel (last 3 results) Recent Labs    12/20/19 1520 12/20/19 2053  TROPONINIHS 13 13    BNP (last 3 results) No results for input(s): BNP in the last 8760 hours.  ProBNP (last 3 results) No results for input(s): PROBNP in the last 8760 hours.   DDimer No results for input(s): DDIMER in the last 168 hours.   Hemoglobin A1c: No results found for: HGBA1C, MPG  TSH No results for input(s): TSH in the last 8760 hours.  Lipid Panel     Component Value Date/Time   CHOL 126 03/14/2019 1121   TRIG 82 03/14/2019 1121   HDL 49 03/14/2019 1121   CHOLHDL 5.1 CALC 12/19/2006 0916   VLDL 26 12/19/2006 0916   LDLCALC 61 03/14/2019 1121   LDLDIRECT 159.2 12/19/2006 0916    Imaging: CT ABDOMEN PELVIS W CONTRAST  Result Date: 12/20/2019 CLINICAL DATA:  86 year old female with lower abdominal cramping. Status post COVID-19 3 weeks ago. Fully vaccinated. EXAM: CT ABDOMEN AND PELVIS WITH CONTRAST TECHNIQUE: Multidetector CT imaging of the abdomen and pelvis was performed using the standard protocol following bolus administration of intravenous contrast. CONTRAST:  175mL OMNIPAQUE IOHEXOL 300 MG/ML  SOLN COMPARISON:  Chest CT 03/17/2016. FINDINGS: Lower chest: Calcified aortic atherosclerosis. Cardiac size at the upper limits of normal. No pericardial or pleural effusion. Negative lung bases aside from mild scarring/atelectasis. Hepatobiliary: Possible hepatic steatosis but otherwise negative liver and gallbladder. Pancreas: Atrophied, otherwise negative. Spleen: Negative. Adrenals/Urinary Tract: Normal adrenal glands. Symmetric renal enhancement and contrast excretion. Negative ureters. Diminutive and unremarkable urinary bladder. Stomach/Bowel: Diverticulosis and retained stool throughout the distal large bowel. Oral contrast has reached the transverse colon. Diverticulosis of the right colon. No large bowel inflammation. Negative terminal ileum. Diminutive or absent appendix. No  dilated small bowel. Decompressed stomach. No free air. No free fluid. Vascular/Lymphatic: Aortoiliac calcified atherosclerosis. There is an infrarenal abdominal aortic aneurysm, diameter up to 5.7 cm. Bulky mural plaque or thrombus. The IMA remains patent. There is mild soft tissue inflammation along the ventral and left lateral margin of the aneurysm as seen on series 2, image 45 and coronal image 50. Other major vascular structures in the abdomen and pelvis are patent. Portal venous system appears to be patent. Reproductive: Surgically absent uterus.  Simple fluid density 4 cm left ovarian cyst. No surrounding inflammation. Diminutive or absent right ovary. Other: No pelvic free fluid. There is a 5-6 cm fat containing left pelvic hernia. Burtis Junes this is an inguinal (coronal image 106) rather than femoral hernia. No associated inflammation. No herniated bowel. Musculoskeletal: Osteopenia. No acute osseous abnormality identified. IMPRESSION: 1. Large Infrarenal Abdominal Aortic Aneurysm measuring up to 5.7 cm with mild perianeurysmal inflammation. Differential considerations include impending AAA rupture, mycotic AAA, symptomatic AAA. Recommend urgent transfer to emergency department and Vascular Surgery consultation. Aortic aneurysm NOS (ICD10-I71.9). Aortic Atherosclerosis (ICD10-I70.0). 2. No other acute or inflammatory process identified. 5 cm fat containing left pelvic hernia. Large bowel diverticulosis. 3. Simple appearing 4 cm left ovarian cyst, recommend follow-up Ultrasound in 6-12 months. Critical Value/emergent results were called by telephone at the time of interpretation on 12/20/2019 at 2:23 pm to provider NP Hyatt in the office of Dr. [name] who verbally acknowledged these results. Electronically Signed   By: Genevie Ann M.D.   On: 12/20/2019 14:26   DG Chest Port 1 View  Result Date: 12/20/2019 CLINICAL DATA:  Postoperative EXAM: PORTABLE CHEST 1 VIEW COMPARISON:  12/20/2019 FINDINGS: Diffuse  cardiac enlargement. Emphysematous changes in the lungs. Coarse interstitial pattern to the lungs could represent fibrosis or edema. No focal consolidation. No pleural effusions. No pneumothorax. Calcified and tortuous aorta. Degenerative changes in the shoulders and spine. Old rib fractures. Similar appearance to previous study. IMPRESSION: 1. Cardiac enlargement. 2. Coarse interstitial pattern to the lungs could represent fibrosis or edema. Electronically Signed   By: Lucienne Capers M.D.   On: 12/20/2019 21:22   DG Chest Portable 1 View  Result Date: 12/20/2019 CLINICAL DATA:  Shortness of breath with new onset atrial fibrillation. EXAM: PORTABLE CHEST 1 VIEW COMPARISON:  Chest radiograph 09/13/2018. Lung bases from abdominal CT earlier today. FINDINGS: Cardiomegaly. Unchanged mediastinal contours. There is aortic tortuosity. Peribronchial cuffing is new from prior exam, suspicious for pulmonary edema. No pleural effusion. No confluent airspace disease. No pneumothorax. Advanced chronic change of both shoulders. Remote right rib fractures. IMPRESSION: Cardiomegaly. Peribronchial cuffing, suspicious for pulmonary edema. Electronically Signed   By: Keith Rake M.D.   On: 12/20/2019 16:45   DG Abd Portable 1V  Result Date: 12/20/2019 CLINICAL DATA:  Postoperative EXAM: X-RAY ABDOMEN single supine VIEW COMPARISON:  CT abdomen and pelvis 12/20/2019 FINDINGS: Interval placement of an aorto iliac stent graft. Residual contrast material in the colon. No small or large bowel distention. IMPRESSION: Interval placement of aorto iliac stent graft. Nonobstructive bowel gas pattern. Electronically Signed   By: Lucienne Capers M.D.   On: 12/20/2019 21:24   HYBRID OR IMAGING ([facility] ONLY)  Result Date: 12/20/2019 There is no interpretation for this exam.  This order is for images obtained during a surgical procedure.  Please See "Surgeries" Tab for more information regarding the procedure.    Cardiac  database: EKG: 12/20/2019: Atrial fibrillation with rapid ventricular response at the rate of 158 bpm, normal axis.  Nonspecific ST abnormality.  Compared to 01/08/2019, sinus rhythm with first-degree AV block without ischemia.  Echocardiogram: 01/25/2019: Left ventricle cavity is normal in size. Severe concentric hypertrophy of the left ventricle. Normal LV systolic function with visual EF 50-55%. Normal global wall motion. Doppler evidence of grade I (impaired) diastolic dysfunction, normal LAP. Calculated EF 47%. Left atrial cavity is mildly dilated. Trileaflet aortic valve with mild calcification of the aortic valve annulus and leaflets. Mild aortic valve stenosis. Aortic valve mean gradient  of 12 mmHg, Vmax of 2.3 m/s. Calculated aortic valve area by continuity equation is 1.3 cm. Trace aortic regurgitation. Mild (Grade I) mitral regurgitation. Mild tricuspid regurgitation. Estimated pulmonary artery systolic pressure is 25 mmHg. No significant change compared to previous study on 09/27/2017.  Stress test: Lexiscan myoview stress test 12/08/2017: 1. Lexiscan stress test was performed. Exercise capacity was not assessed. Stress symptoms included abdominal pain. Blood pressure was normal. The resting and stress electrocardiogram demonstrated normal sinus rhythm, normal resting conduction, no resting arrhythmias and normal rest repolarization. 2. The overall quality of the study is good. There is no evidence of abnormal lung activity. Stress and rest SPECT images demonstrate homogeneous tracer distribution throughout the myocardium. Gated SPECT imaging reveals normal myocardial thickening and wall motion. The left ventricular ejection fraction was normal (54%). 3. Low risk study  Renal artery duplex 09/27/2017: Hemodynamically significant stenosis bilaterally. Renal length is within normal limits for both kidneys. There is an incidental 3.9x3.4x3.4 cm proximal and mid abdominal aortic  aneurysm. Recommend dedicated abdominal aortic duplex scan to further define AAA.  Abdominal Aortic Duplex 01/01/2019: Moderate dilatation of the abdominal aorta is noted in the proximal, mid and distal aorta. An abdominal aortic aneurysm measuring 4.5 x 4.52 x 4.52 cm is seen. Recheck in 6 months for stability of the aneurysm.  Scheduled Meds:  aspirin EC  81 mg Oral Q0600   Bempedoic Acid-Ezetimibe  1 capsule Oral Daily   docusate sodium  100 mg Oral Daily   furosemide  20 mg Oral Daily   heparin  5,000 Units Subcutaneous Q8H   hydrALAZINE  25 mg Oral Q8H   ipratropium  2 spray Each Nare TID   isosorbide dinitrate  30 mg Oral TID   levothyroxine  125 mcg Oral QAC breakfast   losartan  100 mg Oral Daily   metoprolol tartrate  25 mg Oral BID   pantoprazole  40 mg Oral Daily   rOPINIRole  2 mg Oral q1800    Continuous Infusions:  sodium chloride     sodium chloride 75 mL/hr at 12/21/19 0019   diltiazem (CARDIZEM) infusion 5 mg/hr (12/21/19 0343)   magnesium sulfate bolus IVPB      PRN Meds: sodium chloride, acetaminophen **OR** acetaminophen, alum & mag hydroxide-simeth, bisacodyl, diclofenac sodium, fluticasone, guaiFENesin-dextromethorphan, hydrALAZINE, HYDROcodone-acetaminophen, HYDROmorphone (DILAUDID) injection, labetalol, magnesium sulfate bolus IVPB, metoprolol tartrate, ondansetron, phenol, potassium chloride, senna-docusate, sodium phosphate   IMPRESSION & RECOMMENDATIONS: YULIZA CARA is a 86 y.o. female whose past medical history and cardiac risk factors include: obesity, chronic diastolic CHF, hypertensive heart disease, anemia, hemorrhoids, depression, diverticulosis, esophageal reflux, OSA intolerant to CPAP on nocturnal 02, rheumatoid arthritis, and venous insufficiency. CT scan of the chest 03/2016 without definitive ILD. LM and 2 vessel CAD, mild calcification on mitral and aortic valve, symptomatic AAA s/p endovascular repair.   Newly  diagnosed Atrial fibrillation:  Rate control: Lopressor and weaning off Cardizem  Rhythm control: N/A  Thromboembolic prophylaxis: We will start IV heparin without bolus as she has been cleared by vascular surgery.  CHA2DS2-VASc SCORE is 6 which correlates to 9.8% risk of stroke per year ( HFpEF, HTN, Age 67, Vascular disease, female).  Given the IV heparin and recent endovascular repair monitor for signs of bleeding we will recheck hemoglobin in the morning.  We discussed oral anticoagulation prior to discharge.  We discussed options such as Coumadin, Eliquis, Xarelto.  Medication profile discussed with the patient and her daughter at bedside.  Family to decide which  oral anticoagulant they would like to use prior to discharge.  Check EKG  Long-term oral anticoagulation:  Indication: Atrial fibrillation   Initiate IV heparin without bolus, pharmacy to dose. Spoke to inpatient pharmacy as well.   Estimated Creatinine Clearance: 58.2 mL/min (by C-G formula based on SCr of 0.8 mg/dL).  Serum creatinine: 0.8 mg/dL 12/21/19 0137  Estimated creatinine clearance: 58.2 mL/min   Patient has been educated on the importance of monitoring for evidence of bleeding which includes but not limited to hemoptysis, hematochezia, melanotic stools, and hematuria. Patient educated on fall precautions and if she was to be injured despite the mechanism of injury she is to seek medical attention at the closest ER since she will be on blood thinners.  Patient understands importance of this because if internal bleeding is not treated in a timely manner it may further lead to morbidity and/or mortality.  Patient and her daughter Lisa Mcclure voices understanding of these recommendations and provides verbal feedback.  Others:  Symptomatic abdominal aortic aneurysm status post endovascular repair: Followed by vascular surgery.  Chronic diastolic heart failure: Continue guideline directed medical therapy.  Monitor for  now.  Hypertension with chronic kidney disease with HFpEF: Continue current hypertensive medications.  We will increase hydralazine to 50 mg p.o. 3 times daily.  Hypercholesterolemia: Patient is intolerant to statin therapy.  Currently on Nexlizet.  Monitor for now.  Patient's questions and concerns were addressed to her satisfaction. She voices understanding of the instructions provided during this encounter.   This note was created using a voice recognition software as a result there may be grammatical errors inadvertently enclosed that do not reflect the nature of this encounter. Every attempt is made to correct such errors.  Total time spent: 37 minutes.  Rex Kras, DO, [facility]. [place] Office: [phone] 12/21/2019, 11:24 AM

## 2019-12-21 NOTE — Progress Notes (Signed)
Lisa Mcclure - 86 y.o. female MRN [id number]  Date of birth: July 18, 1932  Office Visit Note: Visit Date: 11/14/2019 PCP: [name], MD Referred by: [name], MD  Subjective: Chief Complaint  Patient presents with  . Right Shoulder - Pain   HPI:  Lisa Mcclure is a 86 y.o. female who comes in today for planned repeat Right anesthetic glenohumeral arthrogram with fluoroscopic guidance.  The patient has failed conservative care including home exercise, medications, time and activity modification. Prior injection gave more than 50% relief for several months. This injection will be diagnostic and hopefully therapeutic.  Please see requesting physician notes for further details and justification.  Referring: Janie Morning, MD  ROS Otherwise per HPI.  Assessment & Plan: Visit Diagnoses:  1. Chronic right shoulder pain   2. Arthritis of right glenohumeral joint     Plan: No additional findings.   Meds & Orders: No orders of the defined types were placed in this encounter.   Orders Placed This Encounter  Procedures  . Large Joint Inj  . XR C-ARM NO REPORT    Follow-up: Return if symptoms worsen or fail to improve.   Procedures: Large Joint Inj: R glenohumeral on 11/14/2019 2:00 PM Indications: pain and diagnostic evaluation Details: 22 G 3.5 in needle, fluoroscopy-guided anteromedial approach  Arthrogram: No  Medications: 3 mL bupivacaine 0.5 %; 60 mg triamcinolone acetonide 40 MG/ML Outcome: tolerated well, no immediate complications  There was excellent flow of contrast producing a partial arthrogram of the glenohumeral joint. The patient did have relief of symptoms during the anesthetic phase of the injection. Procedure, treatment alternatives, risks and benefits explained, specific risks discussed. Consent was given by the patient. Immediately prior to procedure a time out was called to verify the correct patient, procedure, equipment, support staff and  site/side marked as required. Patient was prepped and draped in the usual sterile fashion.      No notes on file   Clinical History: Lisa Mcclure - COMPLETE 4+ VIEW  COMPARISON: Chest x-ray dated 03/05/2017.  FINDINGS: There is an age-indeterminate compression fracture of the T12 and T11 vertebral bodies. There is some mild height loss of L2 vertebral body. Degenerative changes are noted throughout the visualized Lisa Mcclure. These are greatest at the lower Lisa segments were there is multilevel facet arthrosis and significant multilevel disc height loss. There appears to be a grade 1-2 anterolisthesis of L5 on S1. Diffuse osteopenia is noted.  IMPRESSION: 1. Age-indeterminate height loss of the T11, T12, and L2 vertebral bodies. Correlation with point tenderness is recommended. If there is clinical concern for an acute fracture this can be further evaluated with cross-sectional imaging. 2. Multilevel degenerative changes throughout the Lisa Mcclure. 3. Grade 1-2 anterolisthesis of L5 on S1. 4. Osteopenia.   Electronically Signed By: Constance Holster M.D. On: 09/14/2018 19:25 ------- EXAM: Lisa Mcclure - 1-2 VIEW  COMPARISON:  None.  FINDINGS: There is no acute displaced fracture or dislocation. There is advanced osteoarthritis of the right hip. There is mild-to-moderate osteoarthritis of the left hip. There are degenerative changes of the pubic symphysis. There is osteopenia.  IMPRESSION: 1. No acute displaced fracture or dislocation. 2. Advanced osteoarthritis of the right hip. 3. Mild-to-moderate osteoarthritis of the left hip. 4. Osteopenia which limits detection of nondisplaced fractures.   Electronically Signed   By: Constance Holster M.D.   On: 09/14/2018 19:20     Objective:  VS:  HT:    WT:   BMI:  BP:(!) 197/103  HR:71bpm  TEMP: ( )  RESP:  Physical Exam Constitutional:      General: She is not in acute distress.    Appearance:  Normal appearance. She is not ill-appearing.  HENT:     Head: Normocephalic and atraumatic.     Right Ear: External ear normal.     Left Ear: External ear normal.  Eyes:     Extraocular Movements: Extraocular movements intact.  Cardiovascular:     Rate and Rhythm: Normal rate.     Pulses: Normal pulses.  Musculoskeletal:     Right lower leg: No edema.     Left lower leg: No edema.     Comments: Painful and limited ROM of the right Shoulder. Good strength.  Skin:    Findings: No erythema, lesion or rash.  Neurological:     General: No focal deficit present.     Mental Status: She is alert and oriented to person, place, and time.     Sensory: No sensory deficit.     Motor: No weakness or abnormal muscle tone.     Coordination: Coordination normal.  Psychiatric:        Mood and Affect: Mood normal.        Behavior: Behavior normal.      Imaging: DG Chest Port 1 View  Result Date: 12/20/2019 CLINICAL DATA:  Postoperative EXAM: PORTABLE CHEST 1 VIEW COMPARISON:  12/20/2019 FINDINGS: Diffuse cardiac enlargement. Emphysematous changes in the lungs. Coarse interstitial pattern to the lungs could represent fibrosis or edema. No focal consolidation. No pleural effusions. No pneumothorax. Calcified and tortuous aorta. Degenerative changes in the shoulders and Mcclure. Old rib fractures. Similar appearance to previous study. IMPRESSION: 1. Cardiac enlargement. 2. Coarse interstitial pattern to the lungs could represent fibrosis or edema. Electronically Signed   By: Lucienne Capers M.D.   On: 12/20/2019 21:22   DG Abd Portable 1V  Result Date: 12/20/2019 CLINICAL DATA:  Postoperative EXAM: X-RAY ABDOMEN single supine VIEW COMPARISON:  CT abdomen and Lisa Mcclure 12/20/2019 FINDINGS: Interval placement of an aorto iliac stent graft. Residual contrast material in the colon. No small or large bowel distention. IMPRESSION: Interval placement of aorto iliac stent graft. Nonobstructive bowel gas pattern.  Electronically Signed   By: Lucienne Capers M.D.   On: 12/20/2019 21:24

## 2019-12-21 NOTE — Progress Notes (Signed)
ANTICOAGULATION CONSULT NOTE  Pharmacy Consult for heparin Indication: atrial fibrillation  Patient Measurements: Height: 5' 3.5" (161.3 cm) Weight: 95.5 kg (210 lb 8.6 oz) IBW/kg (Calculated) : 53.55 Heparin Dosing Weight: 75.5 kg  Vital Signs: Temp: 97.9 F (36.6 C) (09/11 1641) Temp Source: Oral (09/11 1641) BP: 115/71 (09/11 1416) Pulse Rate: 71 (09/11 1158)  Labs: Recent Labs    12/20/19 1520 12/20/19 1520 12/20/19 1842 12/20/19 1842 12/20/19 2053 12/21/19 0137 12/21/19 2015  HGB 11.7*   < > 10.5*   < > 10.8* 11.0*  --   HCT 37.5   < > 31.0*  --  34.6* 35.0*  --   PLT 399  --   --   --  343 318  --   APTT 33  --   --   --  136*  --   --   LABPROT 15.8*  --   --   --  18.2*  --   --   INR 1.3*  --   --   --  1.6*  --   --   HEPARINUNFRC  --   --   --   --   --   --  0.22*  CREATININE 0.87  --   --   --  0.73 0.80  --   TROPONINIHS 13  --   --   --  13  --   --    < > = values in this interval not displayed.    Estimated Creatinine Clearance: 58.2 mL/min (by C-G formula based on SCr of 0.8 mg/dL).   Medical History: Past Medical History:  Diagnosis Date  . Anemia    h/o hemorrhoidal bleeding and blood transfusion  . Cardiomegaly   . Chronic diastolic CHF (congestive heart failure) ([place])   . Depression   . Diverticulosis   . DOE (dyspnea on exertion)   . Esophageal reflux   . GERD (gastroesophageal reflux disease)   . Hypothyroidism   . Insomnia   . LBP (low back pain)   . OAB (overactive bladder)   . Obesity   . OSA (obstructive sleep apnea)   . Osteoarthritis   . Rheumatoid arthritis(714.0)   . Shoulder pain, bilateral   . Unspecified essential hypertension   . Venous insufficiency     Medications:  Infusions:  . sodium chloride    . sodium chloride 75 mL/hr at 12/21/19 0019  . diltiazem (CARDIZEM) infusion 5 mg/hr (12/21/19 1500)  . heparin 1,100 Units/hr (12/21/19 1500)  . magnesium sulfate bolus IVPB      Assessment: 86 yo female  presented abdominal aortic aneurysm repair s/p endovascular repair on 12/20/19. She has been cleared by vascular surgery to start heparin today. Pharmacy has been consulted to start heparin without a bolus for atrial fibrillation.  Initial heparin level subtherapeutic at 0.22.  Goal of Therapy:  Heparin level 0.3-0.7 units/ml Monitor platelets by anticoagulation protocol: Yes   Plan:  Increase heparin to 1300 units/h Recheck heparin level with am labs   Arrie Senate, PharmD, BCPS Clinical Pharmacist [phone] Please check AMION for all [place] numbers 12/21/2019

## 2019-12-21 NOTE — Progress Notes (Signed)
[facility]  Subjective  -abdominal and back pain improved.   Objective (!) 140/94 75 97.6 F (36.4 C) (Oral) 15 95%  Intake/Output Summary (Last 24 hours) at 12/21/2019 1033 Last data filed at 12/21/2019 0700 Gross per 24 hour  Intake 2235.52 ml  Output 1800 ml  Net 435.52 ml    Bilateral femoral access sites without hematoma Doppler signals in bilateral DP PT  Laboratory Lab Results: Recent Labs    12/20/19 2053 12/21/19 0137  WBC 17.0* 15.3*  HGB 10.8* 11.0*  HCT 34.6* 35.0*  PLT 343 318   BMET Recent Labs    12/20/19 2053 12/21/19 0137  NA 135 136  K 4.1 4.5  CL 101 102  CO2 23 22  GLUCOSE 105* 146*  BUN 14 13  CREATININE 0.73 0.80  CALCIUM 8.3* 8.2*    COAG Lab Results  Component Value Date   INR 1.6 (H) 12/20/2019   INR 1.3 (H) 12/20/2019   INR 1.1 01/29/2016   No results found for: PTT  Assessment/Planning:  Postop day 1 status post repair of symptomatic 5.7 cm infrarenal abdominal recannulation with bifurcated stent graft.  Abdominal and back pain are improving.  Her groins look good.  She has Doppler signals in her feet.  Needs a minimum of aspirin from my standpoint.  Appreciate cardiology input for her new onset A. fib.  She is in [facility]VR this morning and will allow them to adjust accordingly, still on dilt drip.  She has been started on Lopressor 25 mg twice daily.  If they feel she needs anticoagulation can likely start heparin today without a bolus today.  Marty Heck 12/21/2019 10:33 AM --

## 2019-12-22 LAB — CBC
HCT: 30.2 % — ABNORMAL LOW (ref 36.0–46.0)
Hemoglobin: 9.5 g/dL — ABNORMAL LOW (ref 12.0–15.0)
MCH: 29.1 pg (ref 26.0–34.0)
MCHC: 31.5 g/dL (ref 30.0–36.0)
MCV: 92.4 fL (ref 80.0–100.0)
Platelets: 332 10*3/uL (ref 150–400)
RBC: 3.27 MIL/uL — ABNORMAL LOW (ref 3.87–5.11)
RDW: 14.2 % (ref 11.5–15.5)
WBC: 14.3 10*3/uL — ABNORMAL HIGH (ref 4.0–10.5)
nRBC: 0 % (ref 0.0–0.2)

## 2019-12-22 LAB — CBC WITH DIFFERENTIAL/PLATELET
Abs Immature Granulocytes: 0.08 10*3/uL — ABNORMAL HIGH (ref 0.00–0.07)
Basophils Absolute: 0 10*3/uL (ref 0.0–0.1)
Basophils Relative: 0 %
Eosinophils Absolute: 0.1 10*3/uL (ref 0.0–0.5)
Eosinophils Relative: 1 %
HCT: 31.5 % — ABNORMAL LOW (ref 36.0–46.0)
Hemoglobin: 9.8 g/dL — ABNORMAL LOW (ref 12.0–15.0)
Immature Granulocytes: 1 %
Lymphocytes Relative: 7 %
Lymphs Abs: 0.9 10*3/uL (ref 0.7–4.0)
MCH: 28.9 pg (ref 26.0–34.0)
MCHC: 31.1 g/dL (ref 30.0–36.0)
MCV: 92.9 fL (ref 80.0–100.0)
Monocytes Absolute: 1.3 10*3/uL — ABNORMAL HIGH (ref 0.1–1.0)
Monocytes Relative: 9 %
Neutro Abs: 11.4 10*3/uL — ABNORMAL HIGH (ref 1.7–7.7)
Neutrophils Relative %: 82 %
Platelets: 348 10*3/uL (ref 150–400)
RBC: 3.39 MIL/uL — ABNORMAL LOW (ref 3.87–5.11)
RDW: 14.2 % (ref 11.5–15.5)
WBC: 13.9 10*3/uL — ABNORMAL HIGH (ref 4.0–10.5)
nRBC: 0 % (ref 0.0–0.2)

## 2019-12-22 LAB — BASIC METABOLIC PANEL
Anion gap: 8 (ref 5–15)
BUN: 17 mg/dL (ref 8–23)
CO2: 27 mmol/L (ref 22–32)
Calcium: 8.1 mg/dL — ABNORMAL LOW (ref 8.9–10.3)
Chloride: 98 mmol/L (ref 98–111)
Creatinine, Ser: 0.98 mg/dL (ref 0.44–1.00)
GFR calc Af Amer: >60 mL/min (ref >60)
GFR calc non Af Amer: 53 mL/min — ABNORMAL LOW (ref >60)
Glucose, Bld: 105 mg/dL — ABNORMAL HIGH (ref 70–99)
Potassium: 5.2 mmol/L — ABNORMAL HIGH (ref 3.5–5.1)
Sodium: 133 mmol/L — ABNORMAL LOW (ref 135–145)

## 2019-12-22 LAB — GLUCOSE, CAPILLARY
Glucose-Capillary: 102 mg/dL — ABNORMAL HIGH (ref 70–99)
Glucose-Capillary: 97 mg/dL (ref 70–99)
Glucose-Capillary: 128 mg/dL — ABNORMAL HIGH (ref 70–99)

## 2019-12-22 LAB — HEPARIN LEVEL (UNFRACTIONATED)
Heparin Unfractionated: 0.58 IU/mL (ref 0.30–0.70)
Heparin Unfractionated: 0.68 IU/mL (ref 0.30–0.70)

## 2019-12-22 LAB — MAGNESIUM: Magnesium: 2 mg/dL (ref 1.7–2.4)

## 2019-12-22 MED ORDER — MAGNESIUM SULFATE 2 GM/50ML IV SOLN
2.0000 g | Freq: Once | INTRAVENOUS | Status: AC
  Administered 2019-12-22: 2 g via INTRAVENOUS
  Filled 2019-12-22: qty 50

## 2019-12-22 MED ORDER — SOTALOL HCL 80 MG PO TABS
80.0000 mg | ORAL_TABLET | Freq: Every day | ORAL | Status: DC
  Administered 2019-12-22: 80 mg via ORAL
  Filled 2019-12-22 (×2): qty 1

## 2019-12-22 MED ORDER — SODIUM CHLORIDE 0.9% FLUSH
3.0000 mL | Freq: Two times a day (BID) | INTRAVENOUS | Status: DC
  Administered 2019-12-22 – 2019-12-25 (×5): 3 mL via INTRAVENOUS

## 2019-12-22 MED ORDER — SODIUM CHLORIDE 0.9% FLUSH: 3.0000 mL | INTRAVENOUS | Status: DC | PRN

## 2019-12-22 MED ORDER — SOTALOL HCL 80 MG PO TABS: 80.0000 mg | ORAL_TABLET | Freq: Two times a day (BID) | ORAL | Status: DC

## 2019-12-22 MED ORDER — SODIUM CHLORIDE 0.9 % IV SOLN: INTRAVENOUS | Status: DC

## 2019-12-22 MED ORDER — HYDRALAZINE HCL 25 MG PO TABS
25.0000 mg | ORAL_TABLET | Freq: Three times a day (TID) | ORAL | Status: DC
  Administered 2019-12-23 – 2019-12-25 (×6): 25 mg via ORAL
  Filled 2019-12-22 (×6): qty 1

## 2019-12-22 MED ORDER — SODIUM CHLORIDE 0.9 % IV SOLN: 250.0000 mL | INTRAVENOUS | Status: DC | PRN

## 2019-12-22 NOTE — Care Management (Signed)
12-22-19 Benefits check submitted for Sotalol, Eliquis and Xarelto. CMA will contact insurance company on Monday for co pay cost and if prior authorization is needed. Graves-Bigelow, Ocie Cornfield, RN, BSN Case Manager

## 2019-12-22 NOTE — Progress Notes (Addendum)
Progress Note  Patient Name: Lisa Mcclure Date of Encounter: 12/22/2019  Attending physician: Marty Heck, MD Primary care provider: [name], MD Primary Cardiologist: Dr. Adrian Prows Consultant:Niquan Charnley Terri Skains, DO, [facility]  Subjective: Lisa Mcclure is a 86 y.o. female who was seen and examined at bedside at approximately 10am Patient is accompanied by her daughter [name] at bedside. No events overnight. Spoke to the patient's nurse who notes that her ventricular rate better while supine (~60-70bpm) and increasing with ambulation or sitting up (~100bpm) Cardizem drip has been weaned down to 5 mcg. Patient denies any abdominal pain or back pain. Patient denies chest pain or heart failure symptoms. Case discussed and reviewed with her nurse.  Objective: Vital Signs in the last 24 hours: Temp:  [97.5 F (36.4 C)-97.9 F (36.6 C)] 97.5 F (36.4 C) (09/12 0810) Pulse Rate:  [55-72] 55 (09/12 0810) Resp:  [13-19] 19 (09/12 0810) BP: (103-127)/(56-87) 103/56 (09/12 0810) SpO2:  [94 %-100 %] 97 % (09/12 0810) Weight:  [95.5 kg] 95.5 kg (09/11 1100)  Intake/Output:  Intake/Output Summary (Last 24 hours) at 12/22/2019 1048 Last data filed at 12/22/2019 0647 Gross per 24 hour  Intake 573.42 ml  Output 1100 ml  Net -526.58 ml    Net IO Since Admission: -91.06 mL [12/22/19 1048]  Weights:  Filed Weights   12/21/19 0500 12/21/19 1100  Weight: 95.5 kg 95.5 kg    Telemetry: Personally reviewed, atrial fibrillation with controlled ventricular rate.  Physical examination: PHYSICAL EXAM: Vitals with BMI 12/22/2019 12/22/2019 12/22/2019  Height - - -  Weight - - -  BMI - - -  Systolic 580 998 338  Diastolic 56 69 75  Pulse 55 64 72    CONSTITUTIONAL: Well-developed and well-nourished. No acute distress.  SKIN: Skin is warm and dry. No rash noted. No cyanosis. No pallor. No jaundice HEAD: Normocephalic and atraumatic.  EYES: No scleral icterus MOUTH/THROAT:  Dry oral membranes.  NECK: No JVD present. No thyromegaly noted. No carotid bruits  LYMPHATIC: No visible cervical adenopathy.  CHEST Normal respiratory effort. No intercostal retractions  LUNGS: Clear to auscultation bilaterally with decreased breath sounds at the bases.  No stridor. No wheezes. No rales.  CARDIOVASCULAR: Irregularly irregular, positive S5-K5, soft systolic ejection murmur heard at the upper right sternal border. ABDOMINAL: Obese, soft, nontender, nondistended, positive bowel sounds in all 4 quadrants, no apparent ascites.  EXTREMITIES: Trace bilateral pitting edema  HEMATOLOGIC: No significant bruising NEUROLOGIC: Oriented to person, place, and time. Nonfocal. Normal muscle tone.  PSYCHIATRIC: Normal mood and affect. Normal behavior. Cooperative  Lab Results: Hematology Recent Labs  Lab 12/20/19 2053 12/21/19 0137 12/22/19 0729  WBC 17.0* 15.3* 14.3*  RBC 3.80* 3.84* 3.27*  HGB 10.8* 11.0* 9.5*  HCT 34.6* 35.0* 30.2*  MCV 91.1 91.1 92.4  MCH 28.4 28.6 29.1  MCHC 31.2 31.4 31.5  RDW 13.9 14.0 14.2  PLT 343 318 332    Chemistry Recent Labs  Lab 12/20/19 1520 12/20/19 1842 12/20/19 2053 12/21/19 0137 12/22/19 0729  NA 131*   < > 135 136 133*  K 4.2   < > 4.1 4.5 5.2*  CL 95*   < > 101 102 98  CO2 24   < > 23 22 27   GLUCOSE 105*   < > 105* 146* 105*  BUN 19   < > 14 13 17   CREATININE 0.87   < > 0.73 0.80 0.98  CALCIUM 8.8*   < > 8.3* 8.2* 8.1*  PROT 6.9  --   --   --   --   ALBUMIN 3.1*  --   --   --   --   AST 46*  --   --   --   --   ALT 41  --   --   --   --   ALKPHOS 65  --   --   --   --   BILITOT 0.7  --   --   --   --   GFRNONAA >60   < > >60 >60 53*  GFRAA >60   < > >60 >60 >60  ANIONGAP 12   < > 11 12 8    < > = values in this interval not displayed.     Cardiac Enzymes: Cardiac Panel (last 3 results) Recent Labs    12/20/19 1520 12/20/19 2053  TROPONINIHS 13 13    BNP (last 3 results) No results for input(s): BNP in the last  8760 hours.  ProBNP (last 3 results) No results for input(s): PROBNP in the last 8760 hours.   DDimer No results for input(s): DDIMER in the last 168 hours.   Hemoglobin A1c: No results found for: HGBA1C, MPG  TSH No results for input(s): TSH in the last 8760 hours.  Lipid Panel     Component Value Date/Time   CHOL 126 03/14/2019 1121   TRIG 82 03/14/2019 1121   HDL 49 03/14/2019 1121   CHOLHDL 5.1 CALC 12/19/2006 0916   VLDL 26 12/19/2006 0916   LDLCALC 61 03/14/2019 1121   LDLDIRECT 159.2 12/19/2006 0916    Imaging: CT ABDOMEN PELVIS W CONTRAST  Result Date: 12/20/2019 CLINICAL DATA:  86 year old female with lower abdominal cramping. Status post COVID-19 3 weeks ago. Fully vaccinated. EXAM: CT ABDOMEN AND PELVIS WITH CONTRAST TECHNIQUE: Multidetector CT imaging of the abdomen and pelvis was performed using the standard protocol following bolus administration of intravenous contrast. CONTRAST:  182mL OMNIPAQUE IOHEXOL 300 MG/ML  SOLN COMPARISON:  Chest CT 03/17/2016. FINDINGS: Lower chest: Calcified aortic atherosclerosis. Cardiac size at the upper limits of normal. No pericardial or pleural effusion. Negative lung bases aside from mild scarring/atelectasis. Hepatobiliary: Possible hepatic steatosis but otherwise negative liver and gallbladder. Pancreas: Atrophied, otherwise negative. Spleen: Negative. Adrenals/Urinary Tract: Normal adrenal glands. Symmetric renal enhancement and contrast excretion. Negative ureters. Diminutive and unremarkable urinary bladder. Stomach/Bowel: Diverticulosis and retained stool throughout the distal large bowel. Oral contrast has reached the transverse colon. Diverticulosis of the right colon. No large bowel inflammation. Negative terminal ileum. Diminutive or absent appendix. No dilated small bowel. Decompressed stomach. No free air. No free fluid. Vascular/Lymphatic: Aortoiliac calcified atherosclerosis. There is an infrarenal abdominal aortic aneurysm,  diameter up to 5.7 cm. Bulky mural plaque or thrombus. The IMA remains patent. There is mild soft tissue inflammation along the ventral and left lateral margin of the aneurysm as seen on series 2, image 45 and coronal image 50. Other major vascular structures in the abdomen and pelvis are patent. Portal venous system appears to be patent. Reproductive: Surgically absent uterus. Simple fluid density 4 cm left ovarian cyst. No surrounding inflammation. Diminutive or absent right ovary. Other: No pelvic free fluid. There is a 5-6 cm fat containing left pelvic hernia. Burtis Junes this is an inguinal (coronal image 1) rather than femoral hernia. No associated inflammation. No herniated bowel. Musculoskeletal: Osteopenia. No acute osseous abnormality identified. IMPRESSION: 1. Large Infrarenal Abdominal Aortic Aneurysm measuring up to 5.7 cm with mild perianeurysmal inflammation. Differential  considerations include impending AAA rupture, mycotic AAA, symptomatic AAA. Recommend urgent transfer to emergency department and Vascular Surgery consultation. Aortic aneurysm NOS (ICD10-I71.9). Aortic Atherosclerosis (ICD10-I70.0). 2. No other acute or inflammatory process identified. 5 cm fat containing left pelvic hernia. Large bowel diverticulosis. 3. Simple appearing 4 cm left ovarian cyst, recommend follow-up Ultrasound in 6-12 months. Critical Value/emergent results were called by telephone at the time of interpretation on 12/20/2019 at 2:23 pm to provider NP Hyatt in the office of Dr. [name] who verbally acknowledged these results. Electronically Signed   By: Genevie Ann M.D.   On: 12/20/2019 14:26   DG Chest Port 1 View  Result Date: 12/20/2019 CLINICAL DATA:  Postoperative EXAM: PORTABLE CHEST 1 VIEW COMPARISON:  12/20/2019 FINDINGS: Diffuse cardiac enlargement. Emphysematous changes in the lungs. Coarse interstitial pattern to the lungs could represent fibrosis or edema. No focal consolidation. No pleural effusions. No  pneumothorax. Calcified and tortuous aorta. Degenerative changes in the shoulders and spine. Old rib fractures. Similar appearance to previous study. IMPRESSION: 1. Cardiac enlargement. 2. Coarse interstitial pattern to the lungs could represent fibrosis or edema. Electronically Signed   By: Lucienne Capers M.D.   On: 12/20/2019 21:22   DG Chest Portable 1 View  Result Date: 12/20/2019 CLINICAL DATA:  Shortness of breath with new onset atrial fibrillation. EXAM: PORTABLE CHEST 1 VIEW COMPARISON:  Chest radiograph 09/13/2018. Lung bases from abdominal CT earlier today. FINDINGS: Cardiomegaly. Unchanged mediastinal contours. There is aortic tortuosity. Peribronchial cuffing is new from prior exam, suspicious for pulmonary edema. No pleural effusion. No confluent airspace disease. No pneumothorax. Advanced chronic change of both shoulders. Remote right rib fractures. IMPRESSION: Cardiomegaly. Peribronchial cuffing, suspicious for pulmonary edema. Electronically Signed   By: Keith Rake M.D.   On: 12/20/2019 16:45   DG Abd Portable 1V  Result Date: 12/20/2019 CLINICAL DATA:  Postoperative EXAM: X-RAY ABDOMEN single supine VIEW COMPARISON:  CT abdomen and pelvis 12/20/2019 FINDINGS: Interval placement of an aorto iliac stent graft. Residual contrast material in the colon. No small or large bowel distention. IMPRESSION: Interval placement of aorto iliac stent graft. Nonobstructive bowel gas pattern. Electronically Signed   By: Lucienne Capers M.D.   On: 12/20/2019 21:24   HYBRID OR IMAGING ([facility] ONLY)  Result Date: 12/20/2019 There is no interpretation for this exam.  This order is for images obtained during a surgical procedure.  Please See "Surgeries" Tab for more information regarding the procedure.    Cardiac database: EKG: 12/20/2019: Atrial fibrillation with rapid ventricular response at the rate of 158 bpm, normal axis.  Nonspecific ST abnormality.  Compared to 01/08/2019, sinus rhythm with  first-degree AV block without ischemia.  Echocardiogram: 01/25/2019: Left ventricle cavity is normal in size. Severe concentric hypertrophy of the left ventricle. Normal LV systolic function with visual EF 50-55%. Normal global wall motion. Doppler evidence of grade I (impaired) diastolic dysfunction, normal LAP. Calculated EF 47%. Left atrial cavity is mildly dilated. Trileaflet aortic valve with mild calcification of the aortic valve annulus and leaflets. Mild aortic valve stenosis. Aortic valve mean gradient of 12 mmHg, Vmax of 2.3 m/s. Calculated aortic valve area by continuity equation is 1.3 cm. Trace aortic regurgitation. Mild (Grade I) mitral regurgitation. Mild tricuspid regurgitation. Estimated pulmonary artery systolic pressure is 25 mmHg. No significant change compared to previous study on 09/27/2017.  Stress test: Lexiscan myoview stress test 12/08/2017: 1. Lexiscan stress test was performed. Exercise capacity was not assessed. Stress symptoms included abdominal pain. Blood pressure was normal.  The resting and stress electrocardiogram demonstrated normal sinus rhythm, normal resting conduction, no resting arrhythmias and normal rest repolarization. 2. The overall quality of the study is good. There is no evidence of abnormal lung activity. Stress and rest SPECT images demonstrate homogeneous tracer distribution throughout the myocardium. Gated SPECT imaging reveals normal myocardial thickening and wall motion. The left ventricular ejection fraction was normal (54%). 3. Low risk study  Renal artery duplex 09/27/2017: Hemodynamically significant stenosis bilaterally. Renal length is within normal limits for both kidneys. There is an incidental 3.9x3.4x3.4 cm proximal and mid abdominal aortic aneurysm. Recommend dedicated abdominal aortic duplex scan to further define AAA.  Abdominal Aortic Duplex 01/01/2019: Moderate dilatation of the abdominal aorta is noted in the proximal,  mid and distal aorta. An abdominal aortic aneurysm measuring 4.5 x 4.52 x 4.52 cm is seen. Recheck in 6 months for stability of the aneurysm.  Scheduled Meds: . aspirin EC  81 mg Oral Q0600  . Bempedoic Acid-Ezetimibe  1 tablet Oral Daily  . docusate sodium  100 mg Oral Daily  . furosemide  20 mg Oral Daily  . hydrALAZINE  50 mg Oral Q8H  . ipratropium  2 spray Each Nare TID  . isosorbide dinitrate  30 mg Oral TID  . levothyroxine  125 mcg Oral QAC breakfast  . losartan  100 mg Oral Daily  . pantoprazole  40 mg Oral Daily  . rOPINIRole  2 mg Oral q1800  . sodium chloride flush  3 mL Intravenous Q12H  . sotalol  80 mg Oral Daily    Continuous Infusions: . sodium chloride    . sodium chloride 75 mL/hr at 12/21/19 0019  . sodium chloride    . diltiazem (CARDIZEM) infusion 5 mg/hr (12/22/19 0245)  . heparin 1,300 Units/hr (12/22/19 0948)  . magnesium sulfate bolus IVPB      PRN Meds: sodium chloride, sodium chloride, acetaminophen **OR** acetaminophen, alum & mag hydroxide-simeth, bisacodyl, diclofenac sodium, fluticasone, guaiFENesin-dextromethorphan, hydrALAZINE, HYDROcodone-acetaminophen, HYDROmorphone (DILAUDID) injection, labetalol, magnesium sulfate bolus IVPB, metoprolol tartrate, ondansetron, phenol, potassium chloride, senna-docusate, sodium chloride flush, sodium phosphate   IMPRESSION & RECOMMENDATIONS: Lisa Mcclure is a 86 y.o. female whose past medical history and cardiac risk factors include: obesity, chronic diastolic CHF, hypertensive heart disease, anemia, hemorrhoids, depression, diverticulosis, esophageal reflux, OSA intolerant to CPAP on nocturnal 02, rheumatoid arthritis, and venous insufficiency. CT scan of the chest 03/2016 without definitive ILD. LM and 2 vessel CAD, mild calcification on mitral and aortic valve, symptomatic AAA s/p endovascular repair.   Newly diagnosed Atrial fibrillation: . Will start Sotalol 80mg  po bid.  . Am EKG reviewed and will do  an EKG 2hr after every dose.  . RN instructed to hold Cardizem drip one hr after sotalol is given.  . Currently on IV heparin. . CHA2DS2-VASc SCORE is 6 correlates to 9.8% risk of stroke per year ( HFpEF, HTN, Age 63, Vascular disease, female). . Added Mg level to morning labs.  . Will continue to monitor.   Long-term oral anticoagulation: . Indication: Atrial fibrillation  . Initiate IV heparin without bolus, pharmacy to dose. Spoke to inpatient pharmacy as well.  Estimated Creatinine Clearance: 47.5 mL/min (by C-G formula based on SCr of 0.98 mg/dL). Serum creatinine: 0.98 mg/dL 12/22/19 0729 . Estimated creatinine clearance: 47.5 mL/min  . Patient chooses to be on either Eliquis or Xarelto depends on cost. Spoke to pharmacy regarding this and will will look into it and get back to the patient.  Others:  Symptomatic abdominal aortic aneurysm status post endovascular repair: Followed by vascular surgery.  Chronic diastolic heart failure: Continue guideline directed medical therapy.  Monitor for now.  Hypertension with chronic kidney disease with HFpEF: Continue current hypertensive medications.    Hypercholesterolemia: Patient is intolerant to statin therapy.  Currently on Nexlizet.  Monitor for now.  Patient's questions and concerns were addressed to her and her daughter's satisfaction. She and her daughter voices understanding of the instructions provided during this encounter.   This note was created using a voice recognition software as a result there may be grammatical errors inadvertently enclosed that do not reflect the nature of this encounter. Every attempt is made to correct such errors.  Total time spent: 40 minutes.  Rex Kras, DO, [facility] Office: [phone] 12/22/2019, 10:48 AM

## 2019-12-22 NOTE — Evaluation (Addendum)
Physical Therapy Evaluation Patient Details Name: Lisa Mcclure MRN: [id number] DOB: 04-Oct-1932 Today's Date: 12/22/2019   History of Present Illness  Pt adm 9/10 with abdominal pain and found to have enlarging AAA. Underwent endovascular repair on 9/10. Pt with afib with RVR post op. PMH - chf, HTN, obesity, OSA, rheumatoid arthritis.  Clinical Impression  Pt admitted with above diagnosis and presents to PT with functional limitations due to deficits listed below (See PT problem list). Pt needs skilled PT to maximize independence and safety to allow discharge to home with intermittent assist. Pt doing well and expect she will progress quickly.      Follow Up Recommendations No PT follow up;Supervision - Intermittent    Equipment Recommendations  None recommended by PT    Recommendations for Other Services       Precautions / Restrictions Precautions Precautions: Fall      Mobility  Bed Mobility Overal bed mobility: Needs Assistance Bed Mobility: Supine to Sit;Sit to Supine     Supine to sit: Modified independent (Device/Increase time);HOB elevated Sit to supine: Min assist   General bed mobility comments: Assist to bring legs up into bed.  Transfers Overall transfer level: Needs assistance Equipment used: None;4-wheeled walker Transfers: Sit to/from Stand Sit to Stand: Supervision         General transfer comment: supervision for safety and lines  Ambulation/Gait Ambulation/Gait assistance: Supervision Gait Distance (Feet): 170 Feet Assistive device: 4-wheeled walker Gait Pattern/deviations: Step-through pattern;Decreased stride length Gait velocity: decr Gait velocity interpretation: 1.31 - 2.62 ft/sec, indicative of limited community ambulator General Gait Details: supervision for safety and lines. HR in the 130's with gait  Stairs            Wheelchair Mobility    Modified Rankin (Stroke Patients Only)       Balance Overall balance  assessment: Mild deficits observed, not formally tested                                           Pertinent Vitals/Pain Pain Assessment: No/denies pain    Home Living Family/patient expects to be discharged to:: Private residence Living Arrangements: Alone Available Help at Discharge: Family;Available PRN/intermittently Type of Home: House Home Access: Level entry     Home Layout: One level Home Equipment: Cane - single point;Walker - 2 wheels;Grab bars - tub/shower      Prior Function Level of Independence: Independent with assistive device(s)         Comments: Uses cane when outside the home. Has been using walker recently.     Hand Dominance   Dominant Hand: Right    Extremity/Trunk Assessment   Upper Extremity Assessment Upper Extremity Assessment: Defer to OT evaluation    Lower Extremity Assessment Lower Extremity Assessment: Generalized weakness       Communication   Communication: No difficulties  Cognition Arousal/Alertness: Awake/alert Behavior During Therapy: WFL for tasks assessed/performed Overall Cognitive Status: Within Functional Limits for tasks assessed                                        General Comments      Exercises     Assessment/Plan    PT Assessment Patient needs continued PT services  PT Problem List Decreased strength;Decreased activity tolerance;Decreased balance;Decreased mobility;Cardiopulmonary status  limiting activity       PT Treatment Interventions DME instruction;Gait training;Functional mobility training;Therapeutic activities;Therapeutic exercise;Balance training;Patient/family education    PT Goals (Current goals can be found in the Care Plan section)  Acute Rehab PT Goals Patient Stated Goal: return home PT Goal Formulation: With patient Time For Goal Achievement: 01/05/20 Potential to Achieve Goals: Good    Frequency Min 3X/week   Barriers to discharge         Co-evaluation               AM-PAC PT "6 Clicks" Mobility  Outcome Measure Help needed turning from your back to your side while in a flat bed without using bedrails?: None Help needed moving from lying on your back to sitting on the side of a flat bed without using bedrails?: None Help needed moving to and from a bed to a chair (including a wheelchair)?: A Little Help needed standing up from a chair using your arms (e.g., wheelchair or bedside chair)?: A Little Help needed to walk in hospital room?: A Little Help needed climbing 3-5 steps with a railing? : A Little 6 Click Score: 20    End of Session   Activity Tolerance: Treatment limited secondary to medical complications (Comment) (high HR) Patient left: in bed;with call bell/phone within reach Nurse Communication: Mobility status PT Visit Diagnosis: Other abnormalities of gait and mobility (R26.89)    Time: 6010-9323 PT Time Calculation (min) (ACUTE ONLY): 29 min   Charges:   PT Evaluation $PT Eval Moderate Complexity: 1 Mod PT Treatments $Gait Training: 8-22 mins        [place] Pager [phone] Office [place] 12/22/2019, 5:04 PM

## 2019-12-22 NOTE — Progress Notes (Addendum)
[facility]  Subjective  - doing well over all.   Objective (!) 103/56 (!) 55 (!) 97.5 F (36.4 C) (Oral) 19 97%  Intake/Output Summary (Last 24 hours) at 12/22/2019 0840 Last data filed at 12/22/2019 0647 Gross per 24 hour  Intake 573.42 ml  Output 1100 ml  Net -526.58 ml    Palpable PT B LE Groins soft without hematoma Heart Afib on Cardizem drip.  Supine 55-60, sitting up 80-100 Lungs non labored breathing  Assessment/Planning: POD # 2 EVAR AAA repair  Afib  With Cardizem drip managed by Cardiology UO I & O x 1 now independently voiding WBC 17 at admission now 14.3 trending down Ordered PT/OT and care manager for discharge planning HGB stable 9.5 Eating well Heparin full does pending oral anticoagulation per cardiology   Roxy Horseman 12/22/2019 8:40 AM --  Laboratory Lab Results: Recent Labs    12/21/19 0137 12/22/19 0729  WBC 15.3* 14.3*  HGB 11.0* 9.5*  HCT 35.0* 30.2*  PLT 318 332   BMET Recent Labs    12/21/19 0137 12/22/19 0729  NA 136 133*  K 4.5 5.2*  CL 102 98  CO2 22 27  GLUCOSE 146* 105*  BUN 13 17  CREATININE 0.80 0.98  CALCIUM 8.2* 8.1*    COAG Lab Results  Component Value Date   INR 1.6 (H) 12/20/2019   INR 1.3 (H) 12/20/2019   INR 1.1 01/29/2016   No results found for: PTT   I have seen and evaluated the patient. I agree with the PA note as documented above. Postop day two status post endovascular stent graft for treatment of symptomatic 5.7 cm infrarenal AAA. Abdominal and back pain significantly improved. She has been kept in the hospital now for her new A. fib with RVR on presentation and appreciate cardiology input. Tolerating heparin and her groins look good. She has been started on oral metoprolol and need to wean her Cardizem. Will need further assistance from cardiology about long-term anticoagulation and getting her off the dilt drip.  Hgb 11 --> 9.5.  Marty Heck,  MD [facility] Office: [phone]

## 2019-12-22 NOTE — Progress Notes (Addendum)
ANTICOAGULATION CONSULT NOTE  Pharmacy Consult for heparin Indication: atrial fibrillation  Patient Measurements: Height: 5' 3.5" (161.3 cm) Weight: 95.5 kg (210 lb 8.6 oz) IBW/kg (Calculated) : 53.55 Heparin Dosing Weight: 75.5 kg  Vital Signs: Temp: 98.5 F (36.9 C) (09/12 1320) Temp Source: Oral (09/12 1320) BP: 91/61 (09/12 1320) Pulse Rate: 101 (09/12 1320)  Labs: Recent Labs    12/20/19 1520 12/20/19 1842 12/20/19 2053 12/20/19 2053 12/21/19 0137 12/21/19 2015 12/22/19 0729 12/22/19 1410  HGB 11.7*   < > 10.8*   < > 11.0*  --  9.5*  --   HCT 37.5   < > 34.6*  --  35.0*  --  30.2*  --   PLT 399   < > 343  --  318  --  332  --   APTT 33  --  136*  --   --   --   --   --   LABPROT 15.8*  --  18.2*  --   --   --   --   --   INR 1.3*  --  1.6*  --   --   --   --   --   HEPARINUNFRC  --   --   --   --   --  0.22* 0.58 0.68  CREATININE 0.87   < > 0.73  --  0.80  --  0.98  --   TROPONINIHS 13  --  13  --   --   --   --   --    < > = values in this interval not displayed.    Estimated Creatinine Clearance: 47.5 mL/min (by C-G formula based on SCr of 0.98 mg/dL).   Medical History: Past Medical History:  Diagnosis Date  . Anemia    h/o hemorrhoidal bleeding and blood transfusion  . Cardiomegaly   . Chronic diastolic CHF (congestive heart failure) ([place])   . Depression   . Diverticulosis   . DOE (dyspnea on exertion)   . Esophageal reflux   . GERD (gastroesophageal reflux disease)   . Hypothyroidism   . Insomnia   . LBP (low back pain)   . OAB (overactive bladder)   . Obesity   . OSA (obstructive sleep apnea)   . Osteoarthritis   . Rheumatoid arthritis(714.0)   . Shoulder pain, bilateral   . Unspecified essential hypertension   . Venous insufficiency     Medications:  Infusions:  . sodium chloride    . sodium chloride 75 mL/hr at 12/21/19 0019  . sodium chloride    . diltiazem (CARDIZEM) infusion Stopped (12/22/19 1258)  . heparin 1,300 Units/hr  (12/22/19 0948)  . magnesium sulfate bolus IVPB    . magnesium sulfate bolus IVPB 2 g (12/22/19 1430)    Assessment: 86 yo female presented abdominal aortic aneurysm repair s/p endovascular repair on 12/20/19. She has been cleared by vascular surgery to start heparin today. Pharmacy has been consulted to start heparin without a bolus for atrial fibrillation.  Heparin level this morning was therapeutic at 0.58 after increasing heparin rate to 1300 last night following initial subtherapeutic level. Had drop in Hgb from 11.0 to 9.5. RN stated patient was having some leaking around her IV site but it was not severe. Repeated confirmatory heparin level this afternoon, which is therapeutic at 0.68 but at upper end of therapeutic range. Given patient's age, renal function, and Hgb drop will slightly decrease rate.   Goal of Therapy:  Heparin  level 0.3-0.7 units/ml Monitor platelets by anticoagulation protocol: Yes   Plan:  Decrease heparin to 1250 units/h Recheck heparin level with am labs Daily CBC, HL, s/sx bleeding   Rebbeca Paul, PharmD PGY1 Pharmacy Resident 12/22/2019 3:02 PM  Please check [email or web address] for unit-specific pharmacy phone numbers.

## 2019-12-22 NOTE — Progress Notes (Signed)
Pharmacy Consult for Sotalol Electrolyte Replacement  Pharmacy consulted to assist in monitoring and replacing electrolytes in this 86 y.o. female admitted on 12/20/2019 undergoing sotalol initiation . First sotalol dose: 80 mg daily starting 9/12.  Performed drug interaction check with patients current medications for QT prolongation:  - Concomitant use with ondansetron can prolong QT interval, however this is PRN and patient has not received any doses since admission   Labs:    Component Value Date/Time   K 5.2 (H) 12/22/2019 0729   MG 2.0 12/22/2019 1045     Plan: Potassium: 5.2 K >/= 4: Appropriate to initiate Sotalol, no replacement needed    Magnesium: 2.0 Mg 1.8-2: Give Mg 2 gm IV x1 to prevent Mg from dropping below 1.8 - do not need to recheck Mg. Appropriate to initiate Sotalol   Thank you for allowing pharmacy to participate in this patient's care   Christy Gentles 12/22/2019  11:01 AM

## 2019-12-22 NOTE — Progress Notes (Signed)
   12/22/19 1221  Assess: MEWS Score  Temp 98.2 F (36.8 C)  BP 99/60  Pulse Rate 65  ECG Heart Rate 86  Resp 13  Level of Consciousness Alert  SpO2 96 %  O2 Device Room Air  Assess: MEWS Score  MEWS Temp 0  MEWS Systolic 1  MEWS Pulse 0  MEWS RR 1  MEWS LOC 0  MEWS Score 2  MEWS Score Color Yellow  Assess: if the MEWS score is Yellow or Red  Were vital signs taken at a resting state? Yes  Focused Assessment No change from prior assessment  Early Detection of Sepsis Score *See Row Information* Low  MEWS guidelines implemented *See Row Information* Yes  Take Vital Signs  Increase Vital Sign Frequency  Yellow: Q 2hr X 2 then Q 4hr X 2, if remains yellow, continue Q 4hrs  Notify: Charge Nurse/RN  Name of Charge Nurse/RN Notified Christy  Date Charge Nurse/RN Notified 12/22/19  Time Charge Nurse/RN Notified 1230  MEWS initiated. [employer] Brennan Bailey notified of blood pressure. Holding cardizem drip for now and starting Sotalol once verified by pharmacy

## 2019-12-23 DIAGNOSIS — Z7901 Long term (current) use of anticoagulants: Secondary | ICD-10-CM

## 2019-12-23 DIAGNOSIS — Z79899 Other long term (current) drug therapy: Secondary | ICD-10-CM

## 2019-12-23 DIAGNOSIS — I4891 Unspecified atrial fibrillation: Secondary | ICD-10-CM

## 2019-12-23 LAB — BASIC METABOLIC PANEL
Anion gap: 7 (ref 5–15)
BUN: 17 mg/dL (ref 8–23)
CO2: 31 mmol/L (ref 22–32)
Calcium: 8.3 mg/dL — ABNORMAL LOW (ref 8.9–10.3)
Chloride: 100 mmol/L (ref 98–111)
Creatinine, Ser: 0.96 mg/dL (ref 0.44–1.00)
GFR calc Af Amer: >60 mL/min (ref >60)
GFR calc non Af Amer: 54 mL/min — ABNORMAL LOW (ref >60)
Glucose, Bld: 89 mg/dL (ref 70–99)
Potassium: 4.7 mmol/L (ref 3.5–5.1)
Sodium: 138 mmol/L (ref 135–145)

## 2019-12-23 LAB — MAGNESIUM: Magnesium: 2.4 mg/dL (ref 1.7–2.4)

## 2019-12-23 LAB — CBC
HCT: 32.5 % — ABNORMAL LOW (ref 36.0–46.0)
Hemoglobin: 9.9 g/dL — ABNORMAL LOW (ref 12.0–15.0)
MCH: 29 pg (ref 26.0–34.0)
MCHC: 30.5 g/dL (ref 30.0–36.0)
MCV: 95.3 fL (ref 80.0–100.0)
Platelets: 354 10*3/uL (ref 150–400)
RBC: 3.41 MIL/uL — ABNORMAL LOW (ref 3.87–5.11)
RDW: 14.4 % (ref 11.5–15.5)
WBC: 12.4 10*3/uL — ABNORMAL HIGH (ref 4.0–10.5)
nRBC: 0 % (ref 0.0–0.2)

## 2019-12-23 LAB — HEPATIC FUNCTION PANEL
ALT: 36 U/L (ref 0–44)
AST: 39 U/L (ref 15–41)
Albumin: 2.9 g/dL — ABNORMAL LOW (ref 3.5–5.0)
Alkaline Phosphatase: 70 U/L (ref 38–126)
Bilirubin, Direct: 0.1 mg/dL (ref 0.0–0.2)
Indirect Bilirubin: 0.4 mg/dL (ref 0.3–0.9)
Total Bilirubin: 0.5 mg/dL (ref 0.3–1.2)
Total Protein: 7.1 g/dL (ref 6.5–8.1)

## 2019-12-23 LAB — GLUCOSE, CAPILLARY
Glucose-Capillary: 89 mg/dL (ref 70–99)
Glucose-Capillary: 93 mg/dL (ref 70–99)
Glucose-Capillary: 98 mg/dL (ref 70–99)

## 2019-12-23 LAB — HEPARIN LEVEL (UNFRACTIONATED)
Heparin Unfractionated: 0.78 IU/mL — ABNORMAL HIGH (ref 0.30–0.70)
Heparin Unfractionated: 0.4 IU/mL (ref 0.30–0.70)

## 2019-12-23 LAB — TSH: TSH: 0.724 u[IU]/mL (ref 0.350–4.500)

## 2019-12-23 MED ORDER — AMIODARONE IV BOLUS ONLY 150 MG/100ML
150.0000 mg | Freq: Once | INTRAVENOUS | Status: AC
  Administered 2019-12-23: 150 mg via INTRAVENOUS
  Filled 2019-12-23: qty 100

## 2019-12-23 MED ORDER — AMIODARONE HCL IN DEXTROSE 360-4.14 MG/200ML-% IV SOLN
60.0000 mg/h | INTRAVENOUS | Status: AC
  Administered 2019-12-23: 60 mg/h via INTRAVENOUS
  Filled 2019-12-23 (×2): qty 200

## 2019-12-23 MED ORDER — AMIODARONE HCL IN DEXTROSE 360-4.14 MG/200ML-% IV SOLN
30.0000 mg/h | INTRAVENOUS | Status: DC
  Administered 2019-12-23 – 2019-12-24 (×2): 30 mg/h via INTRAVENOUS
  Filled 2019-12-23 (×2): qty 200

## 2019-12-23 NOTE — Progress Notes (Signed)
ANTICOAGULATION CONSULT NOTE  Pharmacy Consult for heparin Indication: atrial fibrillation  Patient Measurements: Height: 5' 3.5" (161.3 cm) Weight: 95.5 kg (210 lb 8.6 oz) IBW/kg (Calculated) : 53.55 Heparin Dosing Weight: 75.5 kg  Vital Signs: Temp: 97.3 F (36.3 C) (09/13 0344) Temp Source: Axillary (09/13 0344) BP: 113/76 (09/13 0356) Pulse Rate: 103 (09/12 2045)  Labs: Recent Labs    12/20/19 1520 12/20/19 1842 12/20/19 2053 12/20/19 2053 12/21/19 0137 12/21/19 2015 12/22/19 0729 12/22/19 0729 12/22/19 1410 12/22/19 1908 12/23/19 0350  HGB 11.7*   < > 10.8*   < > 11.0*  --  9.5*   < >  --  9.8* 9.9*  HCT 37.5   < > 34.6*   < > 35.0*  --  30.2*  --   --  31.5* 32.5*  PLT 399   < > 343   < > 318  --  332  --   --  348 354  APTT 33  --  136*  --   --   --   --   --   --   --   --   LABPROT 15.8*  --  18.2*  --   --   --   --   --   --   --   --   INR 1.3*  --  1.6*  --   --   --   --   --   --   --   --   HEPARINUNFRC  --   --   --   --   --    < > 0.58  --  0.68  --  0.78*  CREATININE 0.87   < > 0.73   < > 0.80  --  0.98  --   --   --  0.96  TROPONINIHS 13  --  13  --   --   --   --   --   --   --   --    < > = values in this interval not displayed.    Estimated Creatinine Clearance: 48.5 mL/min (by C-G formula based on SCr of 0.96 mg/dL).   Medical History: Past Medical History:  Diagnosis Date  . Anemia    h/o hemorrhoidal bleeding and blood transfusion  . Cardiomegaly   . Chronic diastolic CHF (congestive heart failure) ([place])   . Depression   . Diverticulosis   . DOE (dyspnea on exertion)   . Esophageal reflux   . GERD (gastroesophageal reflux disease)   . Hypothyroidism   . Insomnia   . LBP (low back pain)   . OAB (overactive bladder)   . Obesity   . OSA (obstructive sleep apnea)   . Osteoarthritis   . Rheumatoid arthritis(714.0)   . Shoulder pain, bilateral   . Unspecified essential hypertension   . Venous insufficiency     Medications:   Infusions:  . sodium chloride    . sodium chloride 75 mL/hr at 12/21/19 0019  . sodium chloride    . sodium chloride 20 mL/hr at 12/22/19 2229  . diltiazem (CARDIZEM) infusion Stopped (12/22/19 1258)  . heparin 1,250 Units/hr (12/22/19 1535)  . magnesium sulfate bolus IVPB      Assessment: 86 yo female presented abdominal aortic aneurysm repair s/p endovascular repair on 12/20/19. She has been cleared by vascular surgery to start heparin today. Pharmacy has been consulted to start heparin without a bolus for atrial fibrillation. -heparin level= 0.78  Goal  of Therapy:  Heparin level 0.3-0.7 units/ml Monitor platelets by anticoagulation protocol: Yes   Plan:  Decrease heparin to 1150 units/hr Heparin level in 6 hours and daily wth CBC daily  Hildred Laser, PharmD Clinical Pharmacist **Pharmacist phone directory can now be found on [email or web address] ([email or web address]).  Listed under [place].

## 2019-12-23 NOTE — Progress Notes (Addendum)
[facility]  Subjective  - Doing well over all except heart rate control.   Objective 113/76 (!) 103 (!) 97.3 F (36.3 C) (Axillary) 16 98%  Intake/Output Summary (Last 24 hours) at 12/23/2019 0840 Last data filed at 12/23/2019 0700 Gross per 24 hour  Intake 1398.66 ml  Output 1700 ml  Net -301.34 ml    Palpable PT pulses B Abdomin soft, no complaints of abdominal pain and improved lumbar pain.  Lungs non labored breathing Heart Afib rate of 130-140 while at bedside.   Assessment/Planning: POD # 3 EVAR AAA repair  New on set Afib requiring restart of dil drip.  Started on Sotalol 90 QD secondary to CR clearance.  Pending HR control she will be discharged on DOAC pending cost.  PT/OT eval today for [facility] needs at discharge.  Roxy Horseman 12/23/2019 8:40 AM --  Laboratory Lab Results: Recent Labs    12/22/19 1908 12/23/19 0350  WBC 13.9* 12.4*  HGB 9.8* 9.9*  HCT 31.5* 32.5*  PLT 348 354   BMET Recent Labs    12/22/19 0729 12/23/19 0350  NA 133* 138  K 5.2* 4.7  CL 98 100  CO2 27 31  GLUCOSE 105* 89  BUN 17 17  CREATININE 0.98 0.96  CALCIUM 8.1* 8.3*    COAG Lab Results  Component Value Date   INR 1.6 (H) 12/20/2019   INR 1.3 (H) 12/20/2019   INR 1.1 01/29/2016   No results found for: PTT   I have seen and evaluated the patient. I agree with the PA note as documented above.  86 year old female status post endovascular repair of symptomatic 5.7 cm abdominal aortic aneurysm Friday night with a bifurcated stent graft.  She has done very well and her back and abdominal pain are resolving.  Her groins look good.  She presented with new onset A. fib with RVR.  Cardiology's been following.  We started her on heparin drip without bolus and try to get her rate controlled.  She is started on sotalol yesterday but still remains on a dilt drip today.  We need cardiology assistance getting her rate controlled and getting off  the dilt drip before she can  be discharged.  Marty Heck, MD [facility] [place] Office: [phone]

## 2019-12-23 NOTE — TOC Benefit Eligibility Note (Signed)
Transition of Care [facility]) Benefit Eligibility Note    Patient Details  Name: Lisa Mcclure MRN: [id number] Date of Birth: January 22, 1933   Medication/Dose: Sotalol 80 mg daily for 30 days, Eliquis 2.5 or 5 mg, Xarelto 25m or 165m Covered?: Yes  Tier: Other (not tier with [facility] Pres[facility]age [facility] Person/Company/Phone Number:: Zach/ [employer] [facility] [phone]-Pay: Sotalol $.86 cents, Eliquis $120.36, and Xarelto $124.69  Prior Approval: No  Deductible: Met       FuKerin Salenhone Number: 12/23/2019, 4:52 PM

## 2019-12-23 NOTE — Progress Notes (Signed)
  Amiodarone Drug - Drug Interaction Consult Note  Recommendations:  Amiodarone is metabolized by the cytochrome P450 system and therefore has the potential to cause many drug interactions. Amiodarone has an average plasma half-life of 50 days (range 20 to 100 days).   There is potential for drug interactions to occur several weeks or months after stopping treatment and the onset of drug interactions may be slow after initiating amiodarone.   []  Statins: Increased risk of myopathy. Simvastatin- restrict dose to 20mg  daily. Other statins: counsel patients to report any muscle pain or weakness immediately.  []  Anticoagulants: Amiodarone can increase anticoagulant effect. Consider warfarin dose reduction. Patients should be monitored closely and the dose of anticoagulant altered accordingly, remembering that amiodarone levels take several weeks to stabilize.  []  Antiepileptics: Amiodarone can increase plasma concentration of phenytoin, the dose should be reduced. Note that small changes in phenytoin dose can result in large changes in levels. Monitor patient and counsel on signs of toxicity.  [x]  Beta blockers: increased risk of bradycardia, AV block and myocardial depression. Sotalol - avoid concomitant use.- Sotalol d/c'd 9/13  [x]   Calcium channel blockers (diltiazem and verapamil): increased risk of bradycardia, AV block and myocardial depression.--IV diltiazem infusion  []   Cyclosporine: Amiodarone increases levels of cyclosporine. Reduced dose of cyclosporine is recommended.  []  Digoxin dose should be halved when amiodarone is started.  [x]  Diuretics: increased risk of cardiotoxicity if hypokalemia occurs.- po Lasix  []  Oral hypoglycemic agents (glyburide, glipizide, glimepiride): increased risk of hypoglycemia. Patient's glucose levels should be monitored closely when initiating amiodarone therapy.   []  Drugs that prolong the QT interval:  Torsades de pointes risk may be increased with  concurrent use - avoid if possible.  Monitor QTc, also keep magnesium/potassium WNL if concurrent therapy can't be avoided. Marland Kitchen Antibiotics: e.g. fluoroquinolones, erythromycin. . Antiarrhythmics: e.g. quinidine, procainamide, disopyramide, sotalol. . Antipsychotics: e.g. phenothiazines, haloperidol.  . Lithium, tricyclic antidepressants, and methadone. Thank You,    Jess Toney S. Alford Highland, PharmD, BCPS Clinical Staff Pharmacist [email or web address] [place]  12/23/2019 1:51 PM

## 2019-12-23 NOTE — Progress Notes (Signed)
Progress Note  Patient Name: Lisa Mcclure Date of Encounter: 12/23/2019  Attending physician: Marty Heck, MD Primary care provider: [name], MD Primary Cardiologist: Dr. Adrian Prows Consultant:Amy Gothard Terri Skains, DO, [facility]  Subjective: Lisa Mcclure is a 86 y.o. female who was seen and examined at bedside at approximately 830am Patient is accompanied by her daughter Lisa Mcclure at bedside. Patient was in the process of being transitioned to oral sotalol for both rhythm and rate control.  However, patient back into A. fib with RVR.  Cardizem drip restarted this morning. Patient denies any abdominal pain. Patient denies chest pain or heart failure symptoms. Case discussed and reviewed with her nurse.  Objective: Vital Signs in the last 24 hours: Temp:  [97.3 F (36.3 C)-98.5 F (36.9 C)] 97.3 F (36.3 C) (09/13 0344) Pulse Rate:  [65-103] 103 (09/12 2045) Resp:  [10-29] 16 (09/13 0356) BP: (91-122)/(51-88) 113/76 (09/13 0356) SpO2:  [96 %-98 %] 98 % (09/12 1320) Weight:  [95.5 kg] 95.5 kg (09/13 0356)  Intake/Output:  Intake/Output Summary (Last 24 hours) at 12/23/2019 1104 Last data filed at 12/23/2019 0700 Gross per 24 hour  Intake 860.75 ml  Output 1700 ml  Net -839.25 ml    Net IO Since Admission: -392.4 mL [12/23/19 1104]  Weights:  Filed Weights   12/21/19 0500 12/21/19 1100 12/23/19 0356  Weight: 95.5 kg 95.5 kg 95.5 kg    Telemetry: Personally reviewed, atrial fibrillation with rapid ventricular rate.  Physical examination: PHYSICAL EXAM: Vitals with BMI 12/23/2019 12/23/2019 12/22/2019  Height - - -  Weight 210 lbs 9 oz - -  BMI 03.50 - -  Systolic 093 818 299  Diastolic 76 80 51  Pulse - - -    CONSTITUTIONAL: Well-developed and well-nourished. No acute distress.  SKIN: Skin is warm and dry. No rash noted. No cyanosis. No pallor. No jaundice HEAD: Normocephalic and atraumatic.  EYES: No scleral icterus MOUTH/THROAT: Dry oral membranes.   NECK: No JVD present. No thyromegaly noted. No carotid bruits  LYMPHATIC: No visible cervical adenopathy.  CHEST Normal respiratory effort. No intercostal retractions  LUNGS: Clear to auscultation bilaterally with decreased breath sounds at the bases.  No stridor. No wheezes. No rales.  CARDIOVASCULAR: Irregularly irregular, positive B7-J6, soft systolic ejection murmur heard at the upper right sternal border. ABDOMINAL: Obese, soft, nontender, nondistended, positive bowel sounds in all 4 quadrants, no apparent ascites.  EXTREMITIES: Trace bilateral pitting edema  HEMATOLOGIC: No significant bruising NEUROLOGIC: Oriented to person, place, and time. Nonfocal. Normal muscle tone.  PSYCHIATRIC: Normal mood and affect. Normal behavior. Cooperative  Lab Results: Hematology Recent Labs  Lab 12/22/19 0729 12/22/19 1908 12/23/19 0350  WBC 14.3* 13.9* 12.4*  RBC 3.27* 3.39* 3.41*  HGB 9.5* 9.8* 9.9*  HCT 30.2* 31.5* 32.5*  MCV 92.4 92.9 95.3  MCH 29.1 28.9 29.0  MCHC 31.5 31.1 30.5  RDW 14.2 14.2 14.4  PLT 332 348 354    Chemistry Recent Labs  Lab 12/20/19 1520 12/20/19 1842 12/21/19 0137 12/22/19 0729 12/23/19 0350  NA 131*   < > 136 133* 138  K 4.2   < > 4.5 5.2* 4.7  CL 95*   < > 102 98 100  CO2 24   < > 22 27 31   GLUCOSE 105*   < > 146* 105* 89  BUN 19   < > 13 17 17   CREATININE 0.87   < > 0.80 0.98 0.96  CALCIUM 8.8*   < > 8.2* 8.1* 8.3*  PROT 6.9  --   --   --   --   ALBUMIN 3.1*  --   --   --   --   AST 46*  --   --   --   --   ALT 41  --   --   --   --   ALKPHOS 65  --   --   --   --   BILITOT 0.7  --   --   --   --   GFRNONAA >60   < > >60 53* 54*  GFRAA >60   < > >60 >60 >60  ANIONGAP 12   < > 12 8 7    < > = values in this interval not displayed.     Cardiac Enzymes: Cardiac Panel (last 3 results) Recent Labs    12/20/19 1520 12/20/19 2053  TROPONINIHS 13 13    BNP (last 3 results) No results for input(s): BNP in the last 8760 hours.  ProBNP  (last 3 results) No results for input(s): PROBNP in the last 8760 hours.   DDimer No results for input(s): DDIMER in the last 168 hours.   Hemoglobin A1c: No results found for: HGBA1C, MPG  TSH No results for input(s): TSH in the last 8760 hours.  Lipid Panel     Component Value Date/Time   CHOL 126 03/14/2019 1121   TRIG 82 03/14/2019 1121   HDL 49 03/14/2019 1121   CHOLHDL 5.1 CALC 12/19/2006 0916   VLDL 26 12/19/2006 0916   LDLCALC 61 03/14/2019 1121   LDLDIRECT 159.2 12/19/2006 0916    Imaging: No results found.  Cardiac database: EKG: 12/20/2019: Atrial fibrillation with rapid ventricular response at the rate of 158 bpm, normal axis.  Nonspecific ST abnormality.  Compared to 01/08/2019, sinus rhythm with first-degree AV block without ischemia.  Echocardiogram: 01/25/2019: Left ventricle cavity is normal in size. Severe concentric hypertrophy of the left ventricle. Normal LV systolic function with visual EF 50-55%. Normal global wall motion. Doppler evidence of grade I (impaired) diastolic dysfunction, normal LAP. Calculated EF 47%. Left atrial cavity is mildly dilated. Trileaflet aortic valve with mild calcification of the aortic valve annulus and leaflets. Mild aortic valve stenosis. Aortic valve mean gradient of 12 mmHg, Vmax of 2.3 m/s. Calculated aortic valve area by continuity equation is 1.3 cm. Trace aortic regurgitation. Mild (Grade I) mitral regurgitation. Mild tricuspid regurgitation. Estimated pulmonary artery systolic pressure is 25 mmHg. No significant change compared to previous study on 09/27/2017.  Stress test: Lexiscan myoview stress test 12/08/2017: 1. Lexiscan stress test was performed. Exercise capacity was not assessed. Stress symptoms included abdominal pain. Blood pressure was normal. The resting and stress electrocardiogram demonstrated normal sinus rhythm, normal resting conduction, no resting arrhythmias and normal rest repolarization. 2. The  overall quality of the study is good. There is no evidence of abnormal lung activity. Stress and rest SPECT images demonstrate homogeneous tracer distribution throughout the myocardium. Gated SPECT imaging reveals normal myocardial thickening and wall motion. The left ventricular ejection fraction was normal (54%). 3. Low risk study  Renal artery duplex 09/27/2017: Hemodynamically significant stenosis bilaterally. Renal length is within normal limits for both kidneys. There is an incidental 3.9x3.4x3.4 cm proximal and mid abdominal aortic aneurysm. Recommend dedicated abdominal aortic duplex scan to further define AAA.  Abdominal Aortic Duplex 01/01/2019: Moderate dilatation of the abdominal aorta is noted in the proximal, mid and distal aorta. An abdominal aortic aneurysm measuring 4.5 x 4.52 x 4.52  cm is seen. Recheck in 6 months for stability of the aneurysm.  Scheduled Meds: . aspirin EC  81 mg Oral Q0600  . Bempedoic Acid-Ezetimibe  1 tablet Oral Daily  . docusate sodium  100 mg Oral Daily  . furosemide  20 mg Oral Daily  . hydrALAZINE  25 mg Oral Q8H  . ipratropium  2 spray Each Nare TID  . isosorbide dinitrate  30 mg Oral TID  . levothyroxine  125 mcg Oral QAC breakfast  . losartan  100 mg Oral Daily  . pantoprazole  40 mg Oral Daily  . rOPINIRole  2 mg Oral q1800  . sodium chloride flush  3 mL Intravenous Q12H    Continuous Infusions: . sodium chloride    . sodium chloride 75 mL/hr at 12/21/19 0019  . sodium chloride    . sodium chloride 20 mL/hr at 12/22/19 2229  . amiodarone 60 mg/hr (12/23/19 1053)   Followed by  . amiodarone    . diltiazem (CARDIZEM) infusion 10 mg/hr (12/23/19 0904)  . heparin 1,150 Units/hr (12/23/19 0645)  . magnesium sulfate bolus IVPB      PRN Meds: sodium chloride, sodium chloride, acetaminophen **OR** acetaminophen, alum & mag hydroxide-simeth, bisacodyl, diclofenac sodium, fluticasone, guaiFENesin-dextromethorphan, hydrALAZINE,  HYDROcodone-acetaminophen, HYDROmorphone (DILAUDID) injection, labetalol, magnesium sulfate bolus IVPB, metoprolol tartrate, ondansetron, phenol, potassium chloride, senna-docusate, sodium chloride flush, sodium phosphate   IMPRESSION & RECOMMENDATIONS: JANUARY BERGTHOLD is a 86 y.o. female whose past medical history and cardiac risk factors include: obesity, chronic diastolic CHF, hypertensive heart disease, anemia, hemorrhoids, depression, diverticulosis, esophageal reflux, OSA intolerant to CPAP on nocturnal 02, rheumatoid arthritis, and venous insufficiency. CT scan of the chest 03/2016 without definitive ILD. LM and 2 vessel CAD, mild calcification on mitral and aortic valve, symptomatic AAA s/p endovascular repair.   Newly diagnosed Atrial fibrillation: . Patient is back in rapid ventricular rate.  . Unable to dose sotalol bid due to renal function, per pharmacy.  Marland Kitchen Spoke to patient's primary cardiologist and shared decision is to start her on Amiodarone.  Marland Kitchen She will be tentatively scheduled for TEE / cardioversion 12/24/2019 with Dr. Einar Gip.  Marland Kitchen Am EKG reviewed.  . Currently on IV heparin. . CHA2DS2-VASc SCORE is 6 correlates to 9.8% risk of stroke per year ( HFpEF, HTN, Age 80, Vascular disease, female). . Add: AST/ ALT, TSH baseline function.  . Will continue to monitor.   Long-term oral anticoagulation: . Indication: Atrial fibrillation  . Initiate IV heparin without bolus, pharmacy to dose. Spoke to inpatient pharmacy as well.  . Patient chooses to be on either Eliquis or Xarelto depends on cost. Spoke to pharmacy regarding this and will will Mcclure into it and get back to the patient.   Risks, benefits of transesophageal echocardiogram, and alternatives have been explained to the patient and her daughter sandra. Complications include but not limited to esophageal perforation (rare), gastrointestinal bleeding (rare), cardiac arrhythmia which can include cardiac arrest and death (rare),  pharyngeal irritation / discomfort with swallowing / hematoma, methemoglobinemia, bronchospasm, transient hypoxia, nonsustained ventricular tachycardia, transient atrial fibrillation, minimal hemoptysis, vomiting, hypotension, respiratory compromise, reaction to medications, unavoidable damage to teeth and gums, aspiration pneumonia  were reviewed with the patient.  Patient voices understands, provides verbal feedback, questions answered, and patient as well as her daughter Lisa Mcclure wishes to proceed with the procedure.  Risks, benefits, and alternatives of direct current cardioversion reviewed with the and patient and her daughter Lisa Mcclure. Risk includes but not limited to: potential for  post-cardioversion rhythms, life-threatening arrhythmias (ventricular tachycardia and fibrillation, profound bradycardia, cardiac arrest), myocardial damage, acute pulmonary edema, skin burns, transient hypotension. Benefits include restoration of sinus rhythm. Alternatives to treatment were discussed, questions were answered, patient voices understanding and provides verbal feedback.  Patient is willing to proceed.    Others:  Symptomatic abdominal aortic aneurysm status post endovascular repair: Followed by vascular surgery.  Chronic diastolic heart failure: Continue guideline directed medical therapy.  Monitor for now.  Hypertension with chronic kidney disease with HFpEF: Continue current hypertensive medications.    Hypercholesterolemia: Patient is intolerant to statin therapy.  Currently on Nexlizet.  Monitor for now.  Patient's questions and concerns were addressed to her and her daughter's satisfaction. She and her daughter voices understanding of the instructions provided during this encounter.   This note was created using a voice recognition software as a result there may be grammatical errors inadvertently enclosed that do not reflect the nature of this encounter. Every attempt is made to correct such  errors.  Total time spent: 40 minutes.  Rex Kras, DO, [facility]. [place] Office: [phone] 12/23/2019, 11:04 AM

## 2019-12-23 NOTE — Progress Notes (Signed)
ANTICOAGULATION CONSULT NOTE - Follow Up Consult  Pharmacy Consult for Heparin Indication: atrial fibrillation  Allergies  Allergen Reactions  . Statins Other (See Comments)    Muscle aches and INTERNAL BLEEDING  . Gabapentin Other (See Comments)    "Made me loopy"  . Methocarbamol Other (See Comments)    "Made me loopy"    Patient Measurements: Height: 5' 3.5" (161.3 cm) Weight: 95.5 kg (210 lb 8.6 oz) IBW/kg (Calculated) : 53.55 Heparin Dosing Weight:  75.5 kg  Vital Signs: Temp: 97.6 F (36.4 C) (09/13 1222) Temp Source: Oral (09/13 1222) BP: 122/80 (09/13 1222) Pulse Rate: 94 (09/13 1222)  Labs: Recent Labs    12/20/19 2053 12/20/19 2053 12/21/19 0137 12/21/19 2015 12/22/19 0729 12/22/19 0729 12/22/19 1410 12/22/19 1908 12/23/19 0350 12/23/19 1424  HGB 10.8*   < > 11.0*  --  9.5*   < >  --  9.8* 9.9*  --   HCT 34.6*   < > 35.0*  --  30.2*  --   --  31.5* 32.5*  --   PLT 343   < > 318  --  332  --   --  348 354  --   APTT 136*  --   --   --   --   --   --   --   --   --   LABPROT 18.2*  --   --   --   --   --   --   --   --   --   INR 1.6*  --   --   --   --   --   --   --   --   --   HEPARINUNFRC  --   --   --    < > 0.58   < > 0.68  --  0.78* 0.40  CREATININE 0.73   < > 0.80  --  0.98  --   --   --  0.96  --   TROPONINIHS 13  --   --   --   --   --   --   --   --   --    < > = values in this interval not displayed.    Estimated Creatinine Clearance: 48.5 mL/min (by C-G formula based on SCr of 0.96 mg/dL).  Assessment: Anticoag: heparin gtt (afib). Monitor for IV site bleeding. Hgb 9.9 stable. CHADS2VASC 6. AM HL 0.78 slightly elevated now 0.4 in goal   Goal of Therapy:  Heparin level 0.3-0.7 units/ml Monitor platelets by anticoagulation protocol: Yes   Plan:  IV heparin 1150 units/hr Daily HL, CBC  F/u PO AC plan - TOC benefits check for Xarelto and Eliquis   Fadel Clason S. Alford Highland, PharmD, BCPS Clinical Staff Pharmacist [email or web address] Alford Highland,  Elisabet Gutzmer Stillinger 12/23/2019,3:21 PM

## 2019-12-23 NOTE — Evaluation (Signed)
Occupational Therapy Evaluation Patient Details Name: Lisa Mcclure MRN: [id number] DOB: 05/20/1932 Today's Date: 12/23/2019    History of Present Illness Pt adm 9/10 with abdominal pain and found to have enlarging AAA. Underwent endovascular repair on 9/10. Pt with afib with RVR post op. PMH - chf, HTN, obesity, OSA, rheumatoid arthritis.   Clinical Impression   Patient admitted with the above diagnosis; presents with functional limitations due to the deficits below.  Patient eval limited to bed level due to new onset of Afib with HR ranging 110-133.  OT to return to assess OOB status.  Patient wishes to return to her condo with [facility] services.  Continue to follow in the acute setting.  Patient with a lot of questions regarding home services and activity allowed, education regarding expectations for home and patient bending kness and rolling in bed to limit current back pain she is experiencing.      Follow Up Recommendations  Home health OT;Supervision/Assistance - 24 hour;Other (comment) (Initial 24 hour, then transition to intermittent.)    Equipment Recommendations       Recommendations for Other Services       Precautions / Restrictions Precautions Precautions: Fall Precaution Comments: Patient stating has been placed on bedrest for elevate HR and fluctuating BP. Restrictions Weight Bearing Restrictions: No      Mobility Bed Mobility   Bed Mobility: Rolling Rolling: Modified independent (Device/Increase time)         General bed mobility comments: Mod A to scoot up in bed for comfort.  Transfers                                                                     Vision Baseline Vision/History: Wears glasses Wears Glasses: At all times Vision Assessment?: No apparent visual deficits     Perception     Praxis      Pertinent Vitals/Pain Pain Assessment: No/denies pain     Hand Dominance Right   Extremity/Trunk Assessment  Upper Extremity Assessment Upper Extremity Assessment: RUE deficits/detail;LUE deficits/detail RUE Deficits / Details: limited shoulder flexion - chronic shoulder dysfunction RUE Sensation: WNL RUE Coordination: WNL LUE Sensation: WNL LUE Coordination: WNL   Lower Extremity Assessment Lower Extremity Assessment: Defer to PT evaluation       Communication Communication Communication: No difficulties   Cognition Arousal/Alertness: Awake/alert Behavior During Therapy: WFL for tasks assessed/performed Overall Cognitive Status: Within Functional Limits for tasks assessed                                                Shoulder Instructions      Home Living Family/patient expects to be discharged to:: Private residence Living Arrangements: Alone Available Help at Discharge: Family;Available PRN/intermittently Type of Home: House Home Access: Level entry     Home Layout: One level     Bathroom Shower/Tub: Occupational psychologist: Handicapped height Bathroom Accessibility: Yes   Home Equipment: [place] - single point;Walker - 2 wheels;Grab bars - tub/shower;Wheelchair - Comptroller          Prior Functioning/Environment Level of Independence: Independent with assistive device(s)  Comments: Uses cane when outside the home. Has been using walker recently.        OT Problem List: Decreased activity tolerance      OT Treatment/Interventions:      OT Goals(Current goals can be found in the care plan section) Acute Rehab OT Goals Patient Stated Goal: Home with home health OT Goal Formulation: With patient/family Time For Goal Achievement: 12/30/19 Potential to Achieve Goals: Good ADL Goals Pt Will Perform Lower Body Bathing: with supervision;sit to/from stand Pt Will Perform Lower Body Dressing: with supervision;sit to/from stand Pt Will Transfer to Toilet: with supervision;ambulating Pt/caregiver will  Perform Home Exercise Program: Both right and left upper extremity;With theraband;With written HEP provided  OT Frequency:     Barriers to D/C:  Medical status           Co-evaluation              AM-PAC OT "6 Clicks" Daily Activity     Outcome Measure Help from another person eating meals?: None Help from another person taking care of personal grooming?: None Help from another person toileting, which includes using toliet, bedpan, or urinal?: A Little Help from another person bathing (including washing, rinsing, drying)?: A Little Help from another person to put on and taking off regular upper body clothing?: A Little Help from another person to put on and taking off regular lower body clothing?: A Little 6 Click Score: 20   End of Session Nurse Communication: Other (comment) (where patient positioned)  Activity Tolerance: Patient tolerated treatment well Patient left: in bed;with bed alarm set;with family/visitor present  OT Visit Diagnosis: Other (comment) (elevated HR)                Time: 5449-2010 OT Time Calculation (min): 30 min Charges:  OT General Charges $OT Visit: 1 Visit OT Evaluation $OT Eval Moderate Complexity: 1 Mod OT Treatments $Therapeutic Activity: 8-22 mins  12/23/2019  Rich, OTR/L  Acute Rehabilitation Services  Office:  [phone]   Metta Clines 12/23/2019, 9:03 AM

## 2019-12-23 NOTE — TOC Progression Note (Signed)
Transition of Care [facility]) - Progression Note    Patient Details  Name: Lisa Mcclure MRN: [id number] Date of Birth: 04/21/1932  [facility] of [facility]) CM/SW Contact  Zenon Mayo, RN Phone Number: 12/23/2019, 4:25 PM  Clinical Narrative:    Submitted benefit check again for medications.        Expected Discharge Plan and Services                                                 Social Determinants of Health (SDOH) Interventions    Readmission Risk Interventions Readmission Risk Prevention Plan 09/16/2018  Transportation Screening Complete  PCP or Specialist Appt within 5-7 Days Complete  Home Care Screening Complete  Medication Review (RN CM) Complete  Some recent data might be hidden

## 2019-12-24 ENCOUNTER — Encounter (HOSPITAL_COMMUNITY)
Admission: EM | Disposition: A | Discharge status: Home / Self Care | Payer: Self-pay | Source: Home / Self Care | Attending: Vascular Surgery

## 2019-12-24 ENCOUNTER — Inpatient Hospital Stay (HOSPITAL_COMMUNITY): Payer: Medicare Other | Admitting: Certified Registered"

## 2019-12-24 ENCOUNTER — Encounter (HOSPITAL_COMMUNITY): Payer: Self-pay | Admitting: Vascular Surgery

## 2019-12-24 ENCOUNTER — Inpatient Hospital Stay (HOSPITAL_COMMUNITY): Payer: Medicare Other

## 2019-12-24 HISTORY — PX: CARDIOVERSION: SHX1299

## 2019-12-24 HISTORY — PX: TEE WITHOUT CARDIOVERSION: SHX5443

## 2019-12-24 LAB — BASIC METABOLIC PANEL
Anion gap: 10 (ref 5–15)
BUN: 19 mg/dL (ref 8–23)
CO2: 27 mmol/L (ref 22–32)
Calcium: 8.4 mg/dL — ABNORMAL LOW (ref 8.9–10.3)
Chloride: 97 mmol/L — ABNORMAL LOW (ref 98–111)
Creatinine, Ser: 0.89 mg/dL (ref 0.44–1.00)
GFR calc Af Amer: >60 mL/min (ref >60)
GFR calc non Af Amer: 60 mL/min — ABNORMAL LOW (ref >60)
Glucose, Bld: 109 mg/dL — ABNORMAL HIGH (ref 70–99)
Potassium: 4.2 mmol/L (ref 3.5–5.1)
Sodium: 134 mmol/L — ABNORMAL LOW (ref 135–145)

## 2019-12-24 LAB — CBC
HCT: 30 % — ABNORMAL LOW (ref 36.0–46.0)
Hemoglobin: 9.1 g/dL — ABNORMAL LOW (ref 12.0–15.0)
MCH: 28.3 pg (ref 26.0–34.0)
MCHC: 30.3 g/dL (ref 30.0–36.0)
MCV: 93.2 fL (ref 80.0–100.0)
Platelets: 317 10*3/uL (ref 150–400)
RBC: 3.22 MIL/uL — ABNORMAL LOW (ref 3.87–5.11)
RDW: 14.5 % (ref 11.5–15.5)
WBC: 10.5 10*3/uL (ref 4.0–10.5)
nRBC: 0 % (ref 0.0–0.2)

## 2019-12-24 LAB — HEPARIN LEVEL (UNFRACTIONATED)
Heparin Unfractionated: 0.21 IU/mL — ABNORMAL LOW (ref 0.30–0.70)
Heparin Unfractionated: 0.2 IU/mL — ABNORMAL LOW (ref 0.30–0.70)

## 2019-12-24 LAB — PROTIME-INR
INR: 1.4 — ABNORMAL HIGH (ref 0.8–1.2)
Prothrombin Time: 16.5 seconds — ABNORMAL HIGH (ref 11.4–15.2)

## 2019-12-24 LAB — MAGNESIUM: Magnesium: 2.2 mg/dL (ref 1.7–2.4)

## 2019-12-24 LAB — GLUCOSE, CAPILLARY
Glucose-Capillary: 103 mg/dL — ABNORMAL HIGH (ref 70–99)
Glucose-Capillary: 105 mg/dL — ABNORMAL HIGH (ref 70–99)
Glucose-Capillary: 90 mg/dL (ref 70–99)

## 2019-12-24 SURGERY — ECHOCARDIOGRAM, TRANSESOPHAGEAL
Anesthesia: Monitor Anesthesia Care

## 2019-12-24 MED ORDER — METOPROLOL TARTRATE 5 MG/5ML IV SOLN
INTRAVENOUS | Status: AC
  Filled 2019-12-24: qty 5

## 2019-12-24 MED ORDER — AMIODARONE HCL 200 MG PO TABS
200.0000 mg | ORAL_TABLET | Freq: Every day | ORAL | Status: DC
  Administered 2019-12-24 – 2019-12-25 (×2): 200 mg via ORAL
  Filled 2019-12-24 (×2): qty 1

## 2019-12-24 MED ORDER — LACTATED RINGERS IV SOLN: INTRAVENOUS | Status: DC | PRN

## 2019-12-24 MED ORDER — METOPROLOL TARTRATE 5 MG/5ML IV SOLN
INTRAVENOUS | Status: DC | PRN
  Administered 2019-12-24: 2.5 mg via INTRAVENOUS

## 2019-12-24 MED ORDER — LIDOCAINE 2% (20 MG/ML) 5 ML SYRINGE
INTRAMUSCULAR | Status: DC | PRN
  Administered 2019-12-24: 60 mg via INTRAVENOUS

## 2019-12-24 MED ORDER — PROPOFOL 500 MG/50ML IV EMUL: INTRAVENOUS | Status: DC | PRN

## 2019-12-24 MED ORDER — HEPARIN BOLUS VIA INFUSION
1500.0000 [IU] | Freq: Once | INTRAVENOUS | Status: DC
  Filled 2019-12-24: qty 1500

## 2019-12-24 MED ORDER — SODIUM CHLORIDE 0.9 % IV SOLN: INTRAVENOUS | Status: DC

## 2019-12-24 MED ORDER — PROPOFOL 500 MG/50ML IV EMUL
INTRAVENOUS | Status: DC | PRN
  Administered 2019-12-24: 20 ug/kg/min via INTRAVENOUS

## 2019-12-24 MED ORDER — PROPOFOL 10 MG/ML IV BOLUS
INTRAVENOUS | Status: DC | PRN
  Administered 2019-12-24 (×2): 10 mg via INTRAVENOUS
  Administered 2019-12-24: 40 mg via INTRAVENOUS

## 2019-12-24 NOTE — CV Procedure (Signed)
Procedure performed: TEE guided direct-current cardioversion  Indication: Persistent atrial fibrillation with rapid ventricular response.  TEE: Under deep sedation with anesthesia help using Propafol, TEE was performed without complications: LV: Normal. Normal EF 50-55%. RV: Normal LA: Normal. Left atrial appendage: Normal without thrombus. Minimal smoke noted. Low normal function. Inter atrial septum is intact without defect.   No late appearance of bubbles either. RA: Normal  MV: Normal Trace MR. TV: Normal Trace TR AV: Mildly calcified. Mildly restricted. Mild AS. Marland Kitchen PV: Normal. Trace PI.  Thoracic and ascending aorta: Mild diffuse atheromatous changes.   Direct current cardioversion:  Indication symptomatic A. Fibrillation.  Procedure: Using Deep sedation with MAC, synchronized direct current cardioversion performed. Patient was delivered with 120x1, 150x1 followed by 200 joules of electricity X 1 with success to NSR. Patient tolerated the procedure well. No immediate complication noted.     Adrian Prows, MD, [facility] 12/24/2019, 3:24 PM Office: [phone]

## 2019-12-24 NOTE — Progress Notes (Signed)
PT Cancellation Note  Patient Details Name: Lisa Mcclure MRN: [id number] DOB: [birth year]   Cancelled Treatment:    Reason Eval/Treat Not Completed: (P) Patient at procedure or test/unavailable Pt is off floor for TEE and cardioversion. PT will follow back for treatment tomorrow.  Lonald Troiani B. Migdalia Dk PT, DPT Acute Rehabilitation Services Pager [phone] Office [phone]    [place] 12/24/2019, 2:55 PM

## 2019-12-24 NOTE — H&P (View-Only) (Signed)
This morning patient feels well, no specific complaints, slightly short of breath.  Still remains in atrial fibrillation with mostly controlled ventricular response but occasional episodes of rapid ventricular response.  No sinus node activity noted for the last 48 hours.  I discussed with her regarding proceeding with TEE guided cardioversion.  She is presently on IV amiodarone, continue the same.  I discussed the risks and benefits of cardioversion and also TEE and patient is willing to proceed.  Suspect she will need home health and PT evaluation.  Schedule for Direct current cardioversion. I have discussed regarding risks benefits rate control vs rhythm control with the patient. Patient understands cardiac arrest and need for CPR, aspiration pneumonia, but not limited to these. Patient is willing.    Complications of TEE including esophageal perforation and trauma to the throat discussed.   Adrian Prows, MD, [facility] 12/24/2019, 8:38 AM Office: [phone]

## 2019-12-24 NOTE — Progress Notes (Signed)
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 She has chronic back pain on chronic narcotic and bilateral "frozen shoulders" that limit her mobility.  She reports mild residual pelvic pain. RN states witnessed apneic episodes and patient has history of OSA but cannot tolerate any of the CPAP devices. Currently NPO for procedure.  Cardiology notes reviewed>cardioversion planned today at 2pm Vitals:   12/24/19 0353 12/24/19 0820  BP: 114/74 110/64  Pulse: 99 (!) 109  Resp: 15 17  Temp: 97.9 F (36.6 C) 97.8 F (36.6 C)  SpO2: 97% 90%    Physical Exam: Cardiac:  irreg rhythm, reg rate Lungs:  CTAB Incisions:  bil groin puncture sites soft without hematoma Extremities:  Feet warm and well perfused Abdomen:  Obese, soft, +BS  CBC    Component Value Date/Time   WBC 10.5 12/24/2019 0627   RBC 3.22 (L) 12/24/2019 0627   HGB 9.1 (L) 12/24/2019 0627   HCT 30.0 (L) 12/24/2019 0627   PLT 317 12/24/2019 0627   MCV 93.2 12/24/2019 0627   MCH 28.3 12/24/2019 0627   MCHC 30.3 12/24/2019 0627   RDW 14.5 12/24/2019 0627   LYMPHSABS 0.9 12/22/2019 1908   MONOABS 1.3 (H) 12/22/2019 1908   EOSABS 0.1 12/22/2019 1908   BASOSABS 0.0 12/22/2019 1908    BMET    Component Value Date/Time   NA 134 (L) 12/24/2019 0627   NA 137 03/16/2015 0000   K 4.2 12/24/2019 0627   CL 97 (L) 12/24/2019 0627   CO2 27 12/24/2019 0627   GLUCOSE 109 (H) 12/24/2019 0627   BUN 19 12/24/2019 0627   BUN 25 (A) 03/16/2015 0000   CREATININE 0.89 12/24/2019 0627   CREATININE 0.98 (H) 01/29/2016 1528   CALCIUM 8.4 (L) 12/24/2019 0627   GFRNONAA 60 (L) 12/24/2019 0627   GFRAA >60 12/24/2019 0627     Intake/Output Summary (Last 24 hours) at 12/24/2019 0857 Last data filed at 12/23/2019 2002 Gross per 24 hour  Intake 484.36 ml  Output 1300 ml  Net -815.64 ml    HOSPITAL MEDICATIONS Scheduled Meds: . aspirin EC  81 mg Oral Q0600  . Bempedoic  Acid-Ezetimibe  1 tablet Oral Daily  . docusate sodium  100 mg Oral Daily  . furosemide  20 mg Oral Daily  . hydrALAZINE  25 mg Oral Q8H  . ipratropium  2 spray Each Nare TID  . isosorbide dinitrate  30 mg Oral TID  . levothyroxine  125 mcg Oral QAC breakfast  . losartan  100 mg Oral Daily  . pantoprazole  40 mg Oral Daily  . rOPINIRole  2 mg Oral q1800  . sodium chloride flush  3 mL Intravenous Q12H   Continuous Infusions: . sodium chloride    . sodium chloride 75 mL/hr at 12/21/19 0019  . sodium chloride    . sodium chloride 20 mL/hr at 12/22/19 2229  . amiodarone 30 mg/hr (12/24/19 0716)  . diltiazem (CARDIZEM) infusion 5 mg/hr (12/23/19 1910)  . heparin 1,150 Units/hr (12/24/19 0456)  . magnesium sulfate bolus IVPB     PRN Meds:.sodium chloride, sodium chloride, acetaminophen **OR** acetaminophen, alum & mag hydroxide-simeth, bisacodyl, diclofenac sodium, fluticasone, guaiFENesin-dextromethorphan, hydrALAZINE, HYDROcodone-acetaminophen, HYDROmorphone (DILAUDID) injection, labetalol, magnesium sulfate bolus IVPB, metoprolol tartrate, ondansetron, phenol, potassium chloride, senna-docusate, sodium chloride flush, sodium phosphate  Assessment: 86 yo female presented with large symptomatic AAA. POD 4 EVAR.  New onset a. Fib with RVR.  Remains  on diltiazem infusion. Hgb low but relatively stable on heparin infusion.  Plt count normal.  Plan: -Cardioversion today. -Hx of OSA and unable to tolerate CPAP. Monitor and limit narcotics as much as possible. -DOAC upon discharge home  -DVT prophylaxis:  heparin infusion   Risa Grill, PA-C [facility] [phone] 12/24/2019  8:57 AM

## 2019-12-24 NOTE — Progress Notes (Signed)
  Echocardiogram Echocardiogram Transesophageal has been performed.  Lisa Mcclure 12/24/2019, 3:40 PM

## 2019-12-24 NOTE — Anesthesia Postprocedure Evaluation (Signed)
Anesthesia Post Note  Patient: Lisa Mcclure  Procedure(s) Performed: TRANSESOPHAGEAL ECHOCARDIOGRAM (TEE) (N/A ) CARDIOVERSION (N/A )     Patient location during evaluation: PACU Anesthesia Type: MAC Level of consciousness: awake and alert Pain management: pain level controlled Vital Signs Assessment: post-procedure vital signs reviewed and stable Respiratory status: spontaneous breathing, nonlabored ventilation, respiratory function stable and patient connected to nasal cannula oxygen Cardiovascular status: stable and blood pressure returned to baseline Postop Assessment: no apparent nausea or vomiting Anesthetic complications: no   No complications documented.  Last Vitals:  Vitals:   12/24/19 1530 12/24/19 1542  BP: (!) 109/47 (!) 130/58  Pulse: (!) 58 (!) 58  Resp: 17 15  Temp:    SpO2: 99% 98%    Last Pain:  Vitals:   12/24/19 1542  TempSrc:   PainSc: 0-No pain                 Belenda Cruise P Dnyla Antonetti

## 2019-12-24 NOTE — Anesthesia Preprocedure Evaluation (Addendum)
Anesthesia Evaluation  Patient identified by MRN, date of birth, ID band Patient awake    Reviewed: Patient's Chart, lab work & pertinent test results  Airway Mallampati: II  TM Distance: >3 FB Neck ROM: Full    Dental  (+) Teeth Intact   Pulmonary sleep apnea and Continuous Positive Airway Pressure Ventilation , former smoker,    Pulmonary exam normal        Cardiovascular hypertension, Pt. on medications +CHF and + DOE  + dysrhythmias Atrial Fibrillation  Rhythm:Irregular Rate:Tachycardia     Neuro/Psych Depression negative neurological ROS     GI/Hepatic Neg liver ROS, GERD  Medicated,  Endo/Other  Hypothyroidism   Renal/GU negative Renal ROS   Overactive bladder    Musculoskeletal  (+) Arthritis , Rheumatoid disorders,    Abdominal (+) + obese,  Abdomen: soft. Bowel sounds: normal.  Peds negative pediatric ROS (+)  Hematology  (+) anemia ,   Anesthesia Other Findings   Reproductive/Obstetrics                           Anesthesia Physical Anesthesia Plan  ASA: III  Anesthesia Plan: MAC   Post-op Pain Management:    Induction:   PONV Risk Score and Plan: 2  Airway Management Planned: Simple Face Mask and Nasal Cannula  Additional Equipment: None  Intra-op Plan:   Post-operative Plan:   Informed Consent: I have reviewed the patients History and Physical, chart, labs and discussed the procedure including the risks, benefits and alternatives for the proposed anesthesia with the patient or authorized representative who has indicated his/her understanding and acceptance.       Plan Discussed with: CRNA and Surgeon  Anesthesia Plan Comments: (PMH last medical history of obesity, chronic diastolic CHF, hypertensive heart disease, HTN, anemia, hemorrhoids, depression, diverticulosis, esophageal reflux, OSA intolerant to CPAP on nocturnal 02, rheumatoid arthritis, and  venous insufficiency. She has been followed in the past by Dr. Einar Gip.  Her last AAA duplex was 01/01/19 abdominal aortic aneurysm measuring 4.5 x 4.52 x 4.52.  Lab Results      Component                Value               Date                      WBC                      10.5                12/24/2019                HGB                      9.1 (L)             12/24/2019                HCT                      30.0 (L)            12/24/2019                MCV                      93.2  12/24/2019                PLT                      317                 12/24/2019            Covid-19 Nucleic Acid Test Results Lab Results      Component                Value               Date                      SARSCOV2NAA              POSITIVE (A)        12/20/2019                [place]              NEGATIVE            09/13/2018          )      Anesthesia Quick Evaluation

## 2019-12-24 NOTE — Transfer of Care (Signed)
Immediate Anesthesia Transfer of Care Note  Patient: Lisa Mcclure  Procedure(s) Performed: TRANSESOPHAGEAL ECHOCARDIOGRAM (TEE) (N/A ) CARDIOVERSION (N/A )  Patient Location: PACU  Anesthesia Type:MAC and General  Level of Consciousness: awake, alert , oriented and patient cooperative  Airway & Oxygen Therapy: Patient Spontanous Breathing and Patient connected to nasal cannula oxygen  Post-op Assessment: Report given to RN and Post -op Vital signs reviewed and stable  Post vital signs: Reviewed and stable  Last Vitals:  Vitals Value Taken Time  BP 109/47 12/24/19 1529  Temp    Pulse 61 12/24/19 1536  Resp 20 12/24/19 1536  SpO2 90 % 12/24/19 1536  Vitals shown include unvalidated device data.  Last Pain:  Vitals:   12/24/19 1409  TempSrc: Oral  PainSc: 6       Patients Stated Pain Goal: 2 (38/25/05 3976)  Complications: No complications documented.

## 2019-12-24 NOTE — Progress Notes (Signed)
This morning patient feels well, no specific complaints, slightly short of breath.  Still remains in atrial fibrillation with mostly controlled ventricular response but occasional episodes of rapid ventricular response.  No sinus node activity noted for the last 48 hours.  I discussed with her regarding proceeding with TEE guided cardioversion.  She is presently on IV amiodarone, continue the same.  I discussed the risks and benefits of cardioversion and also TEE and patient is willing to proceed.  Suspect she will need home health and PT evaluation.  Schedule for Direct current cardioversion. I have discussed regarding risks benefits rate control vs rhythm control with the patient. Patient understands cardiac arrest and need for CPR, aspiration pneumonia, but not limited to these. Patient is willing.    Complications of TEE including esophageal perforation and trauma to the throat discussed.   Adrian Prows, MD, [facility] 12/24/2019, 8:38 AM Office: [phone]

## 2019-12-24 NOTE — Interval H&P Note (Signed)
History and Physical Interval Note:  12/24/2019 2:46 PM  Lisa Mcclure  has presented today for surgery, with the diagnosis of afib.  The various methods of treatment have been discussed with the patient and family. After consideration of risks, benefits and other options for treatment, the patient has consented to  Procedure(s): TRANSESOPHAGEAL ECHOCARDIOGRAM (TEE) (N/A) CARDIOVERSION (N/A) as a surgical intervention.  The patient's history has been reviewed, patient examined, no change in status, stable for surgery.  I have reviewed the patient's chart and labs.  Questions were answered to the patient's satisfaction.     Adrian Prows

## 2019-12-24 NOTE — Progress Notes (Signed)
ANTICOAGULATION CONSULT NOTE - Follow Up Consult  Pharmacy Consult for Heparin Indication: atrial fibrillation  Allergies  Allergen Reactions  . Statins Other (See Comments)    Muscle aches and INTERNAL BLEEDING  . Gabapentin Other (See Comments)    "Made me loopy"  . Methocarbamol Other (See Comments)    "Made me loopy"    Patient Measurements: Height: 5' 3.5" (161.3 cm) Weight: 96.7 kg (213 lb 3 oz) IBW/kg (Calculated) : 53.55 Heparin Dosing Weight:  75.5 kg  Vital Signs: Temp: 97.8 F (36.6 C) (09/14 0820) Temp Source: Oral (09/14 0820) BP: 110/64 (09/14 0820) Pulse Rate: 109 (09/14 0820)  Labs: Recent Labs    12/22/19 0729 12/22/19 1410 12/22/19 1908 12/23/19 0350 12/23/19 1424 12/24/19 0627  HGB 9.5*   < > 9.8* 9.9*  --  9.1*  HCT 30.2*  --  31.5* 32.5*  --  30.0*  PLT 332  --  348 354  --  317  LABPROT  --   --   --   --   --  16.5*  INR  --   --   --   --   --  1.4*  HEPARINUNFRC 0.58   < >  --  0.78* 0.40 0.21*  CREATININE 0.98  --   --  0.96  --  0.89   < > = values in this interval not displayed.    Estimated Creatinine Clearance: 52.6 mL/min (by C-G formula based on SCr of 0.89 mg/dL).  Assessment: 86 yo f with afib currently on IV heparin gtt. Monitor for IV site bleeding. Hgb 9.1 stable. CHADS2VASC 6. Morning heparin level now below goal at 0.21, will increase.    Goal of Therapy:  Heparin level 0.3-0.7 units/ml Monitor platelets by anticoagulation protocol: Yes   Plan:  Increase IV heparin to 1300 units/hr, possible DCCV later today Recheck level tonight F/u PO AC plan - TOC benefits check for Xarelto and Eliquis both >$120  Erin Hearing PharmD., BCPS Clinical Pharmacist 12/24/2019 11:15 AM

## 2019-12-24 NOTE — Progress Notes (Signed)
[facility] [place]  Subjective  -remains in A. fib with RVR.  Abdominal and back pain improving since repair of her aneurysm.   Objective 110/64 (!) 109 97.8 F (36.6 C) (Oral) 17 90%  Intake/Output Summary (Last 24 hours) at 12/24/2019 0904 Last data filed at 12/23/2019 2002 Gross per 24 hour  Intake 484.36 ml  Output 1300 ml  Net -815.64 ml    Palpable femoral pulses bilaterally with soft groins no hematoma Palpable PT pulses bilateral lower extremities  Laboratory Lab Results: Recent Labs    12/23/19 0350 12/24/19 0627  WBC 12.4* 10.5  HGB 9.9* 9.1*  HCT 32.5* 30.0*  PLT 354 317   BMET Recent Labs    12/23/19 0350 12/24/19 0627  NA 138 134*  K 4.7 4.2  CL 100 97*  CO2 31 27  GLUCOSE 89 109*  BUN 17 19  CREATININE 0.96 0.89  CALCIUM 8.3* 8.4*    COAG Lab Results  Component Value Date   INR 1.4 (H) 12/24/2019   INR 1.6 (H) 12/20/2019   INR 1.3 (H) 12/20/2019   No results found for: PTT  Assessment/Planning:  86 year old female status post endovascular repair of symptomatic 5.7 cm abdominal aortic aneurysm Friday night with a bifurcated stent graft.  She has done very well and her back and abdominal pain are resolving.  Her groins look good.  She presented with new onset A. fib with RVR in the ED and cardiology has been following.  We started her on heparin drip without bolus for afib.  Started on amiodarone yesterday.  Plan cardioversion today.   Her A. fib with RVR is keeping her in the hospital otherwise is doing great from an aneurysm stent graft standpoint.  Hgb stable on heparin.    Marty Heck 12/24/2019 9:04 AM --

## 2019-12-25 ENCOUNTER — Telehealth: Payer: Self-pay

## 2019-12-25 ENCOUNTER — Telehealth: Payer: Self-pay | Admitting: Cardiology

## 2019-12-25 LAB — CBC
HCT: 30.7 % — ABNORMAL LOW (ref 36.0–46.0)
Hemoglobin: 9.1 g/dL — ABNORMAL LOW (ref 12.0–15.0)
MCH: 27.8 pg (ref 26.0–34.0)
MCHC: 29.6 g/dL — ABNORMAL LOW (ref 30.0–36.0)
MCV: 93.9 fL (ref 80.0–100.0)
Platelets: 309 10*3/uL (ref 150–400)
RBC: 3.27 MIL/uL — ABNORMAL LOW (ref 3.87–5.11)
RDW: 14.5 % (ref 11.5–15.5)
WBC: 8.8 10*3/uL (ref 4.0–10.5)
nRBC: 0 % (ref 0.0–0.2)

## 2019-12-25 LAB — BASIC METABOLIC PANEL
Anion gap: 9 (ref 5–15)
BUN: 18 mg/dL (ref 8–23)
CO2: 28 mmol/L (ref 22–32)
Calcium: 8.4 mg/dL — ABNORMAL LOW (ref 8.9–10.3)
Chloride: 97 mmol/L — ABNORMAL LOW (ref 98–111)
Creatinine, Ser: 0.87 mg/dL (ref 0.44–1.00)
GFR calc Af Amer: >60 mL/min (ref >60)
GFR calc non Af Amer: >60 mL/min (ref >60)
Glucose, Bld: 100 mg/dL — ABNORMAL HIGH (ref 70–99)
Potassium: 3.9 mmol/L (ref 3.5–5.1)
Sodium: 134 mmol/L — ABNORMAL LOW (ref 135–145)

## 2019-12-25 LAB — HEPARIN LEVEL (UNFRACTIONATED): Heparin Unfractionated: 0.31 IU/mL (ref 0.30–0.70)

## 2019-12-25 LAB — MAGNESIUM: Magnesium: 1.8 mg/dL (ref 1.7–2.4)

## 2019-12-25 MED ORDER — AMIODARONE HCL 200 MG PO TABS
200.0000 mg | ORAL_TABLET | Freq: Every day | ORAL | 0 refills | Status: DC
Start: 2019-12-26 — End: 2020-01-02

## 2019-12-25 MED ORDER — METOPROLOL SUCCINATE ER 50 MG PO TB24
50.0000 mg | ORAL_TABLET | Freq: Every day | ORAL | 0 refills | Status: DC
Start: 2019-12-25 — End: 2020-01-02

## 2019-12-25 MED ORDER — APIXABAN 5 MG PO TABS
5.0000 mg | ORAL_TABLET | Freq: Two times a day (BID) | ORAL | 1 refills | Status: DC
Start: 2019-12-25 — End: 2021-02-16

## 2019-12-25 MED ORDER — ASPIRIN 81 MG PO TBEC
81.0000 mg | DELAYED_RELEASE_TABLET | Freq: Every day | ORAL | 11 refills | Status: DC
Start: 2019-12-26 — End: 2020-06-08

## 2019-12-25 MED ORDER — AMLODIPINE BESYLATE 5 MG PO TABS
5.0000 mg | ORAL_TABLET | Freq: Every day | ORAL | 0 refills | Status: DC
Start: 2019-12-25 — End: 2020-01-02

## 2019-12-25 MED ORDER — AMLODIPINE BESYLATE 5 MG PO TABS
5.0000 mg | ORAL_TABLET | Freq: Every day | ORAL | Status: DC
  Administered 2019-12-25: 5 mg via ORAL
  Filled 2019-12-25: qty 1

## 2019-12-25 MED ORDER — APIXABAN 5 MG PO TABS
5.0000 mg | ORAL_TABLET | Freq: Two times a day (BID) | ORAL | Status: DC
  Administered 2019-12-25: 5 mg via ORAL
  Filled 2019-12-25: qty 1

## 2019-12-25 MED ORDER — METOPROLOL SUCCINATE ER 50 MG PO TB24
50.0000 mg | ORAL_TABLET | Freq: Every day | ORAL | Status: DC
  Administered 2019-12-25: 50 mg via ORAL
  Filled 2019-12-25: qty 1

## 2019-12-25 NOTE — Discharge Instructions (Signed)
   [facility] [place]   Discharge Instructions  Endovascular Aortic Aneurysm Repair  Please refer to the following instructions for your post-procedure care. Your surgeon or Physician Assistant will discuss any changes with you.  Activity  You are encouraged to walk as much as you can. You can slowly return to normal activities but must avoid strenuous activity and heavy lifting until your doctor tells you it's OK. Avoid activities such as vacuuming or swinging a gold club. It is normal to feel tired for several weeks after your surgery. Do not drive until your doctor gives the OK and you are no longer taking prescription pain medications. It is also normal to have difficulty with sleep habits, eating, and bowel movements after surgery. These will go away with time.  Bathing/Showering  You may shower after you go home. If you have an incision, do not soak in a bathtub, hot tub, or swim until the incision heals completely.  Incision Care  Shower every day. Clean your incision with mild soap and water. Pat the area dry with a clean towel. You do not need a bandage unless otherwise instructed. Do not apply any ointments or creams to your incision. If you clothing is irritating, you may cover your incision with a dry gauze pad.  Diet  Resume your normal diet. There are no special food restrictions following this procedure. A low fat/low cholesterol diet is recommended for all patients with vascular disease. In order to heal from your surgery, it is CRITICAL to get adequate nutrition. Your body requires vitamins, minerals, and protein. Vegetables are the best source of vitamins and minerals. Vegetables also provide the perfect balance of protein. Processed food has little nutritional value, so try to avoid this.  Medications  Resume taking all of your medications unless your doctor or nurse practitioner tells you not to. If your incision is causing pain, you may take  over-the-counter pain relievers such as acetaminophen (Tylenol). If you were prescribed a stronger pain medication, please be aware these medications can cause nausea and constipation. Prevent nausea by taking the medication with a snack or meal. Avoid constipation by drinking plenty of fluids and eating foods with a high amount of fiber, such as fruits, vegetables, and grains. Do not take Tylenol if you are taking prescription pain medications.   Follow up  Our office will schedule a follow-up appointment with a C.T. scan 3-4 weeks after your surgery.  Please call us immediately for any of the following conditions  Severe or worsening pain in your legs or feet or in your abdomen back or chest. Increased pain, redness, drainage (pus) from your incision sit. Increased abdominal pain, bloating, nausea, vomiting or persistent diarrhea. Fever of 101 degrees or higher. Swelling in your leg (s),  Reduce your risk of vascular disease  Stop smoking. If you would like help call [facility] at 1-800-QUIT-NOW (1-800-784-8669) or [place] at [phone]. Manage your cholesterol Maintain a desired weight Control your diabetes Keep your blood pressure down  If you have questions, please call the office at [phone].   

## 2019-12-25 NOTE — Telephone Encounter (Signed)
Patient daughter Lisa Rave called stating that the hospital discharged patient today with the understanding that a home health nurse would be out by tomorrow, but they were told by agency that they could have someone as early as Monday and now the daughter doesn't know who is going to take of the patient until then and is very worried and wants you to call her back today if possible.   Lisa Mcclure : [phone]

## 2019-12-25 NOTE — Progress Notes (Signed)
Subjective:  Feels much better, is sitting up on a chair.  No chest pain, dyspnea has improved.  Intake/Output from previous day:  I/O last 3 completed shifts: In: 2114.8 [P.O.:120; I.V.:1994.8] Out: 1400 [Urine:1400] No intake/output data recorded.  Blood pressure (!) 160/73, pulse 76, temperature 97.6 F (36.4 C), temperature source Oral, resp. rate 17, height 5' 3.5" (1.613 m), weight 95.5 kg, SpO2 99 %. Body mass index is 36.72 kg/m.  Physical Exam Vitals reviewed.  Constitutional:      General: She is not in acute distress.    Appearance: She is well-developed.  Cardiovascular:     Rate and Rhythm: Normal rate and regular rhythm.     Pulses: Intact distal pulses.          Femoral pulses are 1+ on the right side and 1+ on the left side.      Popliteal pulses are 1+ on the right side and 1+ on the left side.       Dorsalis pedis pulses are 2+ on the right side and 2+ on the left side.       Posterior tibial pulses are 2+ on the right side and 2+ on the left side.     Heart sounds: No murmur heard.  Harsh early systolic murmur is present at the upper right sternal border radiating to the apex.  No gallop.      Comments: No leg edema, no JVD.  Pulmonary:     Effort: Pulmonary effort is normal. No accessory muscle usage or respiratory distress.     Breath sounds: Normal breath sounds.  Abdominal:     General: Bowel sounds are normal.     Palpations: Abdomen is soft.  Neurological:     Mental Status: She is alert.     Lab Results: BMP BNP (last 3 results) No results for input(s): BNP in the last 8760 hours.  ProBNP (last 3 results) No results for input(s): PROBNP in the last 8760 hours. BMP Latest Ref Rng & Units 12/25/2019 12/24/2019 12/23/2019  Glucose 70 - 99 mg/dL 100(H) 109(H) 89  BUN 8 - 23 mg/dL 18 19 17   Creatinine 0.44 - 1.00 mg/dL 0.87 0.89 0.96  Sodium 135 - 145 mmol/L 134(L) 134(L) 138  Potassium 3.5 - 5.1 mmol/L 3.9 4.2 4.7  Chloride 98 - 111 mmol/L 97(L)  97(L) 100  CO2 22 - 32 mmol/L 28 27 31   Calcium 8.9 - 10.3 mg/dL 8.4(L) 8.4(L) 8.3(L)   Hepatic Function Latest Ref Rng & Units 12/23/2019 12/20/2019 09/17/2018  Total Protein 6.5 - 8.1 g/dL 7.1 6.9 -  Albumin 3.5 - 5.0 g/dL 2.9(L) 3.1(L) 3.1(L)  AST 15 - 41 U/L 39 46(H) -  ALT 0 - 44 U/L 36 41 -  Alk Phosphatase 38 - 126 U/L 70 65 -  Total Bilirubin 0.3 - 1.2 mg/dL 0.5 0.7 -  Bilirubin, Direct 0.0 - 0.2 mg/dL 0.1 0.2 -   CBC Latest Ref Rng & Units 12/25/2019 12/24/2019 12/23/2019  WBC 4.0 - 10.5 K/uL 8.8 10.5 12.4(H)  Hemoglobin 12.0 - 15.0 g/dL 9.1(L) 9.1(L) 9.9(L)  Hematocrit 36 - 46 % 30.7(L) 30.0(L) 32.5(L)  Platelets 150 - 400 K/uL 309 317 354   Lipid Panel Recent Labs    03/14/19 1121  CHOL 126  TRIG 82  LDLCALC 61  HDL 49    Cardiac Panel (last 3 results) No results for input(s): CKTOTAL, CKMB, TROPONINI, RELINDX in the last 72 hours.  HEMOGLOBIN A1C No results found for: HGBA1C,  MPG TSH Recent Labs    12/23/19 1424  TSH 0.724   Imaging: No results found.  Cardiac Studies:  Stress test: Lexiscan myoview stress test 12/08/2017: 1. Lexiscan stress test was performed. Exercise capacity was not assessed. Stress symptoms included abdominal pain. Blood pressure was normal. The resting and stress electrocardiogram demonstrated normal sinus rhythm, normal resting conduction, no resting arrhythmias and normal rest repolarization. 2. The overall quality of the study is good. There is no evidence of abnormal lung activity. Stress and rest SPECT images demonstrate homogeneous tracer distribution throughout the myocardium. Gated SPECT imaging reveals normal myocardial thickening and wall motion. The left ventricular ejection fraction was normal (54%). 3. Low risk study  Renal artery duplex 09/27/2017: Hemodynamically significant stenosis bilaterally. Renal length is within normal limits for both kidneys. There is an incidental 3.9x3.4x3.4 cm proximal and mid abdominal aortic  aneurysm. Recommend dedicated abdominal aortic duplex scan to further define AAA.  Abdominal Aortic Duplex 01/01/2019: Moderate dilatation of the abdominal aorta is noted in the proximal, mid and distal aorta. An abdominal aortic aneurysm measuring 4.5 x 4.52 x 4.52 cm is seen. Recheck in 6 months for stability of the aneurysm.  Echocardiogram: 01/25/2019: Left ventricle cavity is normal in size. Severe concentric hypertrophy of the left ventricle. Normal LV systolic function with visual EF 50-55%. Normal global wall motion. Doppler evidence of grade I (impaired) diastolic dysfunction, normal LAP. Calculated EF 47%. Left atrial cavity is mildly dilated. Trileaflet aortic valve with mild calcification of the aortic valve annulus and leaflets. Mild aortic valve stenosis. Aortic valve mean gradient of 12 mmHg, Vmax of 2.3 m/s. Calculated aortic valve area by continuity equation is 1.3 cm. Trace aortic regurgitation. Mild (Grade I) mitral regurgitation. Mild tricuspid regurgitation. Estimated pulmonary artery systolic pressure is 25 mmHg. No significant change compared to previous study on 09/27/2017.  TEE 12/24/2019: Low normal LVEF at 50 to 55%.  Mild aortic valve stenosis.  Trace MR and mild TR.  EKG:  EKG 12/24/2019: Normal sinus rhythm with rate of 61 bpm, poor R wave progression, cannot exclude anteroseptal infarct old.  No evidence of ischemia, normal QT interval.  Compared to prior EKG, atrial fibrillation with rapid ventricular response no longer present.  Scheduled Meds: . amiodarone  200 mg Oral Daily  . apixaban  5 mg Oral BID  . aspirin EC  81 mg Oral Q0600  . Bempedoic Acid-Ezetimibe  1 tablet Oral Daily  . docusate sodium  100 mg Oral Daily  . furosemide  20 mg Oral Daily  . hydrALAZINE  25 mg Oral Q8H  . ipratropium  2 spray Each Nare TID  . isosorbide dinitrate  30 mg Oral TID  . levothyroxine  125 mcg Oral QAC breakfast  . losartan  100 mg Oral Daily  . metoprolol  succinate  50 mg Oral Daily  . pantoprazole  40 mg Oral Daily  . rOPINIRole  2 mg Oral q1800  . sodium chloride flush  3 mL Intravenous Q12H   Continuous Infusions: . sodium chloride    . sodium chloride 75 mL/hr at 12/21/19 0019  . sodium chloride    . sodium chloride 20 mL/hr at 12/22/19 2229  . magnesium sulfate bolus IVPB     PRN Meds:.sodium chloride, sodium chloride, acetaminophen **OR** acetaminophen, alum & mag hydroxide-simeth, bisacodyl, diclofenac sodium, fluticasone, guaiFENesin-dextromethorphan, hydrALAZINE, HYDROcodone-acetaminophen, HYDROmorphone (DILAUDID) injection, magnesium sulfate bolus IVPB, ondansetron, phenol, potassium chloride, senna-docusate, sodium chloride flush  No current facility-administered medications on file prior to encounter.  Current Outpatient Medications on File Prior to Encounter  Medication Sig Dispense Refill  . Bempedoic Acid-Ezetimibe (NEXLIZET) 180-10 MG TABS Take 1 capsule by mouth daily. 30 tablet 3  . carvedilol (COREG) 12.5 MG tablet Take 1 tablet (12.5 mg total) by mouth 2 (two) times daily. (Patient taking differently: Take 6.25 mg by mouth 2 (two) times daily. ) 180 tablet 3  . Cholecalciferol (VITAMIN D3) 125 MCG (5000 UT) CAPS Take 5,000 Units by mouth daily with breakfast.    . diclofenac sodium (VOLTAREN) 1 % GEL Apply 2.25 g topically 3 (three) times daily as needed (for pain).     . fluticasone (FLONASE) 50 MCG/ACT nasal spray Place 2 sprays into both nostrils daily as needed for allergies or rhinitis.     . furosemide (LASIX) 20 MG tablet Take 1 tablet (20 mg total) by mouth daily. 30 tablet   . hydrALAZINE (APRESOLINE) 25 MG tablet TAKE 1 TABLET 3 TIMES A DAY (Patient taking differently: Take 25 mg by mouth 3 (three) times daily. ) 270 tablet 1  . HYDROcodone-acetaminophen (NORCO) 7.5-325 MG tablet Take 1 tablet by mouth every 6 (six) hours as needed for moderate pain. 20 tablet 0  . ipratropium (ATROVENT) 0.03 % nasal spray  Place 2 sprays into both nostrils 3 (three) times daily.     . isosorbide dinitrate (ISORDIL) 30 MG tablet Take 30 mg by mouth 3 (three) times daily.    Marland Kitchen levothyroxine (SYNTHROID) 125 MCG tablet Take 125 mcg by mouth daily before breakfast.     . losartan (COZAAR) 100 MG tablet TAKE 1 TABLET(100 MG) BY MOUTH TWICE DAILY (Patient taking differently: Take 100 mg by mouth 2 (two) times daily. ) 180 tablet 1  . omeprazole (PRILOSEC) 20 MG capsule Take 20 mg by mouth 2 (two) times daily before a meal.     . rOPINIRole (REQUIP) 1 MG tablet Take 1 mg by mouth See admin instructions. Take 1 mg by mouth once a day between 3 PM-4 PM and an additional 1 mg in the evening, if no relief  12    Assessment/Plan:   1.  New onset atrial fibrillation with rapid ventricular response, successful direct-current cardioversion 12/24/2019 with TEE guidance.  Maintain sinus rhythm on telemetry with no recurrence.  Has had occasional episodes of PACs and also a brief atrial tachycardia. 2.  Chronic diastolic heart failure 3.  Primary hypertension 4.  Abdominal aortic aneurysm SP endovascular repair of the AAA. 5.  Hyperlipidemia, now well controlled 6.  Moderate obesity 6.  Physical deconditioning and gait instability  CHA2DS2-VASc Score is 5.  Yearly risk of stroke: 7.2% (F, A, CHF, HTN, Vasc Dz).  Score of 1=0.6; 2=2.2; 3=3.2; 4=4.8; 5=7.2; 6=9.8; 7=>9.8) -(CHF; HTN; vasc disease DM,  Female = 1; Age <65 =0; 65-74 = 1,  >75 =2; stroke/embolism= 2).    Recommendation: I have discontinued IV heparin and switch her to Eliquis 5 mg p.o. twice daily for atrial fibrillation.  I will also discontinue IV diltiazem and switch her to p.o. metoprolol succinate 50 mg daily and also add amlodipine 5 mg daily for hypertension.  Please discharged home on these medications along with amiodarone 200 mg p.o. daily.  I will set her up to see me back in the office in 1 week.  I have done and completed face-to-face evaluation for home  health, patient prefers [facility] services.  Call if questions.     Adrian Prows, MD, [facility] 12/25/2019, 9:38 AM Office: [phone]

## 2019-12-25 NOTE — Progress Notes (Signed)
ANTICOAGULATION CONSULT NOTE - Follow Up Consult  Pharmacy Consult for Heparin Indication: atrial fibrillation  Allergies  Allergen Reactions  . Statins Other (See Comments)    Muscle aches and INTERNAL BLEEDING  . Gabapentin Other (See Comments)    "Made me loopy"  . Methocarbamol Other (See Comments)    "Made me loopy"    Patient Measurements: Height: 5' 3.5" (161.3 cm) Weight: 95.5 kg (210 lb 9.6 oz) IBW/kg (Calculated) : 53.55 Heparin Dosing Weight:  75.5 kg  Vital Signs: Temp: 97.6 F (36.4 C) (09/15 0514) Temp Source: Oral (09/15 0514) BP: 136/66 (09/15 0514) Pulse Rate: 69 (09/15 0514)  Labs: Recent Labs    12/23/19 0350 12/23/19 1424 12/24/19 0627 12/24/19 2002 12/25/19 0339  HGB 9.9*  --  9.1*  --  9.1*  HCT 32.5*  --  30.0*  --  30.7*  PLT 354  --  317  --  309  LABPROT  --   --  16.5*  --   --   INR  --   --  1.4*  --   --   HEPARINUNFRC 0.78*   < > 0.21* 0.20* 0.31  CREATININE 0.96  --  0.89  --  0.87   < > = values in this interval not displayed.    Estimated Creatinine Clearance: 53.5 mL/min (by C-G formula based on SCr of 0.87 mg/dL).  Assessment: 86 yo f with afib currently on IV heparin gtt. Monitor for IV site bleeding. Hgb 9.1 stable. CHADS2VASC 6. Morning heparin level at goal at 0.31, will increase to keep within range post cardioversion   Goal of Therapy:  Heparin level 0.3-0.7 units/ml Monitor platelets by anticoagulation protocol: Yes   Plan:  Increase IV heparin to 1400 units/hr F/u PO AC plan - TOC benefits check for Xarelto and Eliquis both >$120  Erin Hearing PharmD., BCPS Clinical Pharmacist 12/25/2019 7:33 AM

## 2019-12-25 NOTE — Telephone Encounter (Signed)
Patient is still currently in the hospital, may possibly be discharged later today or tomorrow.

## 2019-12-25 NOTE — Progress Notes (Addendum)
[facility] [place]  Subjective  - Doing well without new coplaints.   Objective 136/66 69 97.6 F (36.4 C) (Oral) 14 93%  Intake/Output Summary (Last 24 hours) at 12/25/2019 0722 Last data filed at 12/25/2019 0500 Gross per 24 hour  Intake 2114.81 ml  Output 900 ml  Net 1214.81 ml   Palpable PT pulses B Abdomin soft, no complaints of abdominal pain and improved lumbar pain.  Lungs non labored breathing Heart RRR this am after cardio conversation    Assessment/Planning: POD # 5 EVAR AAA repair  Patent EVAR with improved abdominal and lumbar pain TEE Conversion 12/24/19 now in sinus rhythm Heparin and Cardizem drip continued [place] at discharge pending cost analysis    Roxy Horseman 12/25/2019 7:22 AM --  Laboratory Lab Results: Recent Labs    12/24/19 0627 12/25/19 0339  WBC 10.5 8.8  HGB 9.1* 9.1*  HCT 30.0* 30.7*  PLT 317 309   BMET Recent Labs    12/24/19 0627 12/25/19 0339  NA 134* 134*  K 4.2 3.9  CL 97* 97*  CO2 27 28  GLUCOSE 109* 100*  BUN 19 18  CREATININE 0.89 0.87  CALCIUM 8.4* 8.4*    COAG Lab Results  Component Value Date   INR 1.4 (H) 12/24/2019   INR 1.6 (H) 12/20/2019   INR 1.3 (H) 12/20/2019   No results found for: PTT  I have seen and evaluated the patient. I agree with the PA note as documented above.  POD#5 s/p stent graft repair of symptomatic AAA.  Doing very well today. Abdominal pain better.  Groins look good and palpable PT pulses.  Cardioversion yesterday.  Sinus rhythm today and off dilt drip.  Discussed with cardiology and ok for discharge.  F/U with me in one month with CTA abdomen pelvis in clinic.  Will be d/c'd on DOAC per cardiology.  Marty Heck, MD [facility] [place] Office: [phone]

## 2019-12-25 NOTE — TOC Initial Note (Signed)
Transition of Care [facility]) - Initial/Assessment Note    Patient Details  Name: Lisa Mcclure MRN: [id number] Date of Birth: [birth year]  [facility] CM/SW Contact:    Bethena Roys, RN Phone Number: 12/25/2019, 1:34 PM  Clinical Narrative: Risk for readmission assessment completed. Case Manager spoke with patient regarding transition of care needs. Patient is agreeable to home health services. Medicare.gov list provided to patient and patient wanted to use [place] for [facility] RN, PT, [employer]. Referral was made to [facility] and [facility] to begin within 24-48 hours post transition home. Son in the room at the time of conversation. He will be traveling back home tonight and the patient's daughter lives in [place] will check on her. Patient was provided a list of personal care services. Patient feels that she may need an aide for about 4-8 hours during the day. Son to call and get this scheduled for the patient. Son will provide transportation home. Patient uses [facility] for 02 2 Liters at night. No further needs from Case Manager at this time.Bethena Roys, RN, BSN Case Manager                   Expected Discharge Plan: [place] Barriers to Discharge: No Barriers Identified   Patient Goals and CMS Choice Patient states their goals for this hospitalization and ongoing recovery are:: to return home CMS Medicare.gov Compare Post Acute Care list provided to:: Patient Choice offered to / list presented to : Patient  Expected Discharge Plan and Services Expected Discharge Plan: [place] In-house Referral: NA Discharge Planning Services: CM Consult Post Acute Care Choice: [place] arrangements for the past 2 months: Single Family Home Expected Discharge Date: 12/25/19               DME Arranged: N/A DME Agency: NA       HH Arranged: RN, Disease Management, PT, Nurse's Aide [place] Agency: [place] Date [facility] Agency Contacted: 12/25/19 Time HH Agency Contacted: 34 Representative spoke with at [place]: Tommi Rumps  Prior Living Arrangements/Services Living arrangements for the past 2 months: [place] with:: Self (Daughter comes by to check on the patient.) Patient language and need for interpreter reviewed:: Yes Do you feel safe going back to the place where you live?: Yes      Need for Family Participation in Patient Care: Yes (Comment) Care giver support system in place?: Yes (comment)   Criminal Activity/Legal Involvement Pertinent to Current Situation/Hospitalization: No - Comment as needed  Activities of Daily Living   ADL Screening (condition at time of admission) Patient's cognitive ability adequate to safely complete daily activities?: Yes Is the patient deaf or have difficulty hearing?: No Does the patient have difficulty seeing, even when wearing glasses/contacts?: No Does the patient have difficulty concentrating, remembering, or making decisions?: No Patient able to express need for assistance with ADLs?: Yes Does the patient have difficulty dressing or bathing?: No Independently performs ADLs?: Yes (appropriate for developmental age) Does the patient have difficulty walking or climbing stairs?: Yes Weakness of Legs: Both Weakness of Arms/Hands: None  Permission Sought/Granted Permission sought to share information with : Family Supports, Customer service manager, Case Optician, dispensing granted to share information with : Yes, Verbal Permission Granted     Permission granted to share info w AGENCY: [facility]        Emotional Assessment Appearance:: Appears stated age Attitude/Demeanor/Rapport: Engaged Affect (typically  observed): Appropriate Orientation: : Oriented to Self, Oriented to Place, Oriented to  Time, Oriented to Situation Alcohol / Substance Use: Not Applicable Psych Involvement: No (comment)  Admission diagnosis:  Atrial  fibrillation with RVR (HCC) [I48.91] Abdominal aortic aneurysm (AAA) without rupture (HCC) [I71.4] AAA (abdominal aortic aneurysm) without rupture (HCC) [I71.4] AAA (abdominal aortic aneurysm) (HCC) [I71.4] Patient Active Problem List   Diagnosis Date Noted   Atrial fibrillation with RVR ([place])    Long term (current) use of anticoagulants    Long term current use of antiarrhythmic drug    AAA (abdominal aortic aneurysm) without rupture ([place]) 12/20/2019   AAA (abdominal aortic aneurysm) ([place]) 12/20/2019   Intolerance of continuous positive airway pressure (CPAP) ventilation 11/26/2019   Severe hypoxemia 08/21/2019   Severe obstructive sleep apnea 07/29/2019   Chronic obstructive pulmonary disease (HCC) 07/29/2019   RLS (restless legs syndrome) 07/29/2019   Dehydration with hyponatremia 09/13/2018   Bradycardia 09/13/2018   Glaucoma 09/13/2018   OA (osteoarthritis) of shoulder 05/16/2018   Aortic stenosis, mild 01/15/2016   Bilateral carotid bruits 05/11/2015   Hyponatremia 04/19/2015   Chronic anemia 04/19/2015   Panic attack 04/19/2015   Constipation 04/19/2015   Hyperkalemia 04/17/2015   Influenza A 04/16/2015   Proximal humerus fracture    Left patella fracture    Benign essential HTN    Esophageal reflux    Chronic heart failure with preserved ejection fraction (HFpEF) (HCC)    Hypothyroidism    Lower GI bleed 04/08/2015   Hematochezia    Rectal bleeding 04/03/2015   Patellar fracture 03/06/2015   Left humeral fracture 11/04/2013   Rib fracture 11/04/2013   Hypothyroid 11/04/2013   Hypertension 11/04/2013   Right shoulder pain 11/04/2013   OSA (obstructive sleep apnea) 11/13/2008   BOOP (bronchiolitis obliterans with organizing pneumonia) ([place]) 08/25/2008   PCP:  [name], MD Pharmacy:   [facility] [place], [place] AT [facility] [address] [place]  [address] Phone: [phone] Fax: [phone]     Social Determinants of Health (SDOH) Interventions    Readmission Risk Interventions Readmission Risk Prevention Plan 12/25/2019 09/16/2018  Transportation Screening Complete Complete  PCP or Specialist Appt within 5-7 Days - Complete  PCP or Specialist Appt within 3-5 Days Complete -  Home Care Screening - Complete  Medication Review (RN CM) - Complete  HRI or Home Care Consult Complete -  Social Work Consult for Recovery Care Planning/Counseling Complete -  Palliative Care Screening Not Applicable -  Medication Review (RN Care Manager) Complete -  Some recent data might be hidden

## 2019-12-25 NOTE — Telephone Encounter (Signed)
TOC needed per [place]  Appointment Thursday at 9:00 am with celeste . Thanks

## 2019-12-26 ENCOUNTER — Other Ambulatory Visit: Payer: Self-pay

## 2019-12-26 ENCOUNTER — Encounter (HOSPITAL_COMMUNITY): Payer: Self-pay | Admitting: Cardiology

## 2019-12-26 NOTE — Discharge Summary (Signed)
[facility] Discharge Summary   Patient ID:  Lisa Mcclure MRN: [id number] DOB/AGE: 1932/06/07 86 y.o.  Admit date: 12/20/2019 Discharge date: 12/25/19 Date of Surgery: 12/20/2019 - 12/24/2019 Surgeon: Juliann Mule): Adrian Prows, MD  Admission Diagnosis: Atrial fibrillation with RVR ([place]) [I48.91] Abdominal aortic aneurysm (AAA) without rupture ([facility]) [I71.4] AAA (abdominal aortic aneurysm) without rupture ([place]) [I71.4] AAA (abdominal aortic aneurysm) ([place]) [I71.4]  Discharge Diagnoses:  Atrial fibrillation with RVR ([place]) [I48.91] Abdominal aortic aneurysm (AAA) without rupture ([place]) [I71.4] AAA (abdominal aortic aneurysm) without rupture ([place]) [I71.4] AAA (abdominal aortic aneurysm) ([place]) [I71.4]  Secondary Diagnoses: Past Medical History:  Diagnosis Date  . Anemia    h/o hemorrhoidal bleeding and blood transfusion  . Cardiomegaly   . Chronic diastolic CHF (congestive heart failure) ([place])   . Depression   . Diverticulosis   . DOE (dyspnea on exertion)   . Esophageal reflux   . GERD (gastroesophageal reflux disease)   . Hypothyroidism   . Insomnia   . LBP (low back pain)   . OAB (overactive bladder)   . Obesity   . OSA (obstructive sleep apnea)   . Osteoarthritis   . Rheumatoid arthritis(714.0)   . Shoulder pain, bilateral   . Unspecified essential hypertension   . Venous insufficiency     Procedure(s): TRANSESOPHAGEAL ECHOCARDIOGRAM (TEE) CARDIOVERSION  Discharged Condition: stable  HPI: 86 y/o female presented to the ED with worsening abdominal and lumbar pain.  She has a known AAA that has been followed by Dr. Einar Gip.   CT shows a 5.7 cm infrarenal abdominal aortic aneurysm.  She has fair amount of tenderness over the aneurysm and suspect this is the cause of her symptoms.  Dr. Carlis Abbott recommended urgent repair.    Hospital Course:  Lisa Mcclure is a 86 y.o. female is S/P  Procedure(s): EVAR  12/20/19  TRANSESOPHAGEAL ECHOCARDIOGRAM  (TEE) CARDIOVERSION   At presentation she had developed new on set Afib.  From a vascular point of view she is stable with palpable pedal pulses and significantly decreased lumbar pain and no abdominal pain.  He Afib was managed with a diltiazem drip, followed by BB and finally she responded to TEE guided direct-current cardioversion.  She is now in sinus rhythms.    She is being discharged on  Eliquis 5 mg p.o. twice daily for atrial fibrillation.   Discontinue IV diltiazem and switch her to p.o. metoprolol succinate 50 mg daily and also add amlodipine 5 mg daily for hypertension, along with amiodarone 200 mg p.o. daily.  Stable disposition for discharge with [facility] on 12/25/19.   Consults:  Dr. Einar Gip   Significant Diagnostic Studies: CBC Lab Results  Component Value Date   WBC 8.8 12/25/2019   HGB 9.1 (L) 12/25/2019   HCT 30.7 (L) 12/25/2019   MCV 93.9 12/25/2019   PLT 309 12/25/2019    BMET    Component Value Date/Time   NA 134 (L) 12/25/2019 0339   NA 137 03/16/2015 0000   K 3.9 12/25/2019 0339   CL 97 (L) 12/25/2019 0339   CO2 28 12/25/2019 0339   GLUCOSE 100 (H) 12/25/2019 0339   BUN 18 12/25/2019 0339   BUN 25 (A) 03/16/2015 0000   CREATININE 0.87 12/25/2019 0339   CREATININE 0.98 (H) 01/29/2016 1528   CALCIUM 8.4 (L) 12/25/2019 0339   GFRNONAA >60 12/25/2019 0339   GFRAA >60 12/25/2019 0339   COAG Lab Results  Component Value Date   INR 1.4 (H) 12/24/2019  INR 1.6 (H) 12/20/2019   INR 1.3 (H) 12/20/2019     Disposition:  Discharge to :Home Discharge Instructions    Call MD for:  redness, tenderness, or signs of infection (pain, swelling, bleeding, redness, odor or green/yellow discharge around incision site)   Complete by: As directed    Call MD for:  severe or increased pain, loss or decreased feeling  in affected limb(s)   Complete by: As directed    Call MD for:  temperature >100.5   Complete by: As directed    Resume previous diet   Complete by: As  directed      Allergies as of 12/25/2019      Reactions   Statins Other (See Comments)   Muscle aches and INTERNAL BLEEDING   Gabapentin Other (See Comments)   "Made me loopy"   Methocarbamol Other (See Comments)   "Made me loopy"      Medication List    TAKE these medications   amiodarone 200 MG tablet Commonly known as: PACERONE Take 1 tablet (200 mg total) by mouth daily.   amLODipine 5 MG tablet Commonly known as: NORVASC Take 1 tablet (5 mg total) by mouth daily.   apixaban 5 MG Tabs tablet Commonly known as: ELIQUIS Take 1 tablet (5 mg total) by mouth 2 (two) times daily.   aspirin 81 MG EC tablet Take 1 tablet (81 mg total) by mouth daily at 6 (six) AM. Swallow whole.   carvedilol 12.5 MG tablet Commonly known as: COREG Take 1 tablet (12.5 mg total) by mouth 2 (two) times daily. What changed: how much to take   diclofenac sodium 1 % Gel Commonly known as: VOLTAREN Apply 2.25 g topically 3 (three) times daily as needed (for pain).   fluticasone 50 MCG/ACT nasal spray Commonly known as: FLONASE Place 2 sprays into both nostrils daily as needed for allergies or rhinitis.   furosemide 20 MG tablet Commonly known as: Lasix Take 1 tablet (20 mg total) by mouth daily.   hydrALAZINE 25 MG tablet Commonly known as: APRESOLINE TAKE 1 TABLET 3 TIMES A DAY What changed:   how much to take  how to take this  when to take this  additional instructions   HYDROcodone-acetaminophen 7.5-325 MG tablet Commonly known as: NORCO Take 1 tablet by mouth every 6 (six) hours as needed for moderate pain.   ipratropium 0.03 % nasal spray Commonly known as: ATROVENT Place 2 sprays into both nostrils 3 (three) times daily.   isosorbide dinitrate 30 MG tablet Commonly known as: ISORDIL Take 30 mg by mouth 3 (three) times daily.   levothyroxine 125 MCG tablet Commonly known as: SYNTHROID Take 125 mcg by mouth daily before breakfast.   losartan 100 MG  tablet Commonly known as: COZAAR TAKE 1 TABLET(100 MG) BY MOUTH TWICE DAILY What changed: See the new instructions.   metoprolol succinate 50 MG 24 hr tablet Commonly known as: TOPROL-XL Take 1 tablet (50 mg total) by mouth daily. Take with or immediately following a meal.   Nexlizet 180-10 MG Tabs Generic drug: Bempedoic Acid-Ezetimibe Take 1 capsule by mouth daily.   omeprazole 20 MG capsule Commonly known as: PRILOSEC Take 20 mg by mouth 2 (two) times daily before a meal.   rOPINIRole 1 MG tablet Commonly known as: REQUIP Take 1 mg by mouth See admin instructions. Take 1 mg by mouth once a day between 3 PM-4 PM and an additional 1 mg in the evening, if no relief  Vitamin D3 125 MCG (5000 UT) Caps Take 5,000 Units by mouth daily with breakfast.      Verbal and written Discharge instructions given to the patient. Wound care per Discharge AVS  Follow-up Information    Marty Heck, MD Follow up in 1 month(s).   Specialty: Vascular Surgery Why: office will call Contact information: [place] [address] [phone]        Lawerance Cruel C, PA-C Follow up on 01/02/2020.   Specialty: Cardiology Why: 9AM appointment. Please bring all medications  Contact information: [place] [address] [phone]        [facility], [facility] Follow up.   Specialty: [place] Why: Registered Nurse, Physical Therapy, Aide Contact information: [place] [address] [phone]               Signed: Roxy Horseman 12/26/2019, 8:09 AM - For VQI Registry use --- Instructions: Press F2 to tab through selections.  Delete question if not applicable.   Post-op:  Time to Extubation: [x ] In OR, [ ]  < 12 hrs, [ ]  12-24 hrs, [ ]  >=24 hrs Vasopressors Req. Post-op: No MI: [x ] No, [ ]  Troponin only, [ ]  EKG or Clinical New Arrhythmia: Yes CHF: No ICU Stay: 0 days Transfusion:  No  If yes, 0 units given  Complications: Resp failure: [x ] none, [ ]  Pneumonia, [ ]  Ventilator Chg in renal function: [x ] none, [ ]  Inc. Cr > 0.5, [ ]  Temp. Dialysis, [ ]  Permanent dialysis Leg ischemia: [x ] No, [ ]  Yes, no Surgery needed, [ ]  Yes, Surgery needed, [ ]  Amputation Bowel ischemia: [x ] No, [ ]  Medical Rx, [ ]  Surgical Rx Wound complication: [x ] No, [ ]  Superficial separation/infection, [ ]  Return to OR Return to OR: No  Return to OR for bleeding: No Stroke: [ ]  None, [ ]  Minor, [ ]  Major  Discharge medications: Statin use:  No  for medical reason intolerance  ASA use:  Yes Plavix use:  No  for medical reason   Beta blocker use:  Yes

## 2019-12-26 NOTE — Consult Note (Addendum)
   [facility] CM Inpatient Consult   12/26/2019  Lisa Mcclure Jan 07, 1933 [id number]   [place] [facility] [ACO] Patient: Medicare    Patient screened for high risk score for unplanned readmission score and for transition from the hospital.   Review of patient's medical record reveals patient transitioned home with home health  Primary Care Provider is [name], MD this provider is listed to provide the transition of care [TOC] for post hospital follow up.  Brief medical record review post transition reveals questions regarding home health follow up. Call placed to patient's daughter, HIPAA verified,  who states her mother is currently at her own address with her brother  Call placed to patient, HIPAA verified regarding post hospital care needs.  Patient states permission to speak with daughter. Patient states she has currently "taken care of [her] caregiver need at the moment and has hired [employer] to provide supervision."  Patient confirms that [employer] personal care does not take her blood pressure or pulse.  Patient denies issues with transportation and food resources as her daughter helps with this.  As speaking with daughter, Lisa Mcclure, HIPAA verified she states her brother told her that no home health would start until Monday. She was very concern for the patient being home alone after her brother leaves for [place]. She  states is 9 miles from her mom and she takes care of her husband.   Patient agreed to community assistance with [facility] care management benefits, which was explained as a no additional cost for in network resources.  Plan:  Follow up with inpatient [facility] RNCM confirms patient had chosen [facility] but the personal care service needs were only discussed and was given information for personal care service providers.  Contact liaison with [facility] for more information.  Will have a referral sent to [facility] for follow  up.  Continue to follow progress and disposition to assess for post hospital care management needs.  Addendum: Patient did confirm that the assessment nurse had called and was scheduled for a visit on tomorrow.   Referral sent to [facility].   For questions contact:   Natividad Brood, RN BSN [facility] Liaison  [phone] business mobile phone Toll free office [phone]  Fax number: [phone] [email or web address]

## 2019-12-26 NOTE — Telephone Encounter (Signed)
Message left

## 2019-12-27 ENCOUNTER — Other Ambulatory Visit: Payer: Self-pay

## 2019-12-27 ENCOUNTER — Telehealth: Payer: Self-pay

## 2019-12-27 DIAGNOSIS — N3281 Overactive bladder: Secondary | ICD-10-CM | POA: Diagnosis not present

## 2019-12-27 DIAGNOSIS — I0981 Rheumatic heart failure: Secondary | ICD-10-CM | POA: Diagnosis not present

## 2019-12-27 DIAGNOSIS — Z48812 Encounter for surgical aftercare following surgery on the circulatory system: Secondary | ICD-10-CM | POA: Diagnosis not present

## 2019-12-27 DIAGNOSIS — F329 Major depressive disorder, single episode, unspecified: Secondary | ICD-10-CM | POA: Diagnosis not present

## 2019-12-27 DIAGNOSIS — I5032 Chronic diastolic (congestive) heart failure: Secondary | ICD-10-CM | POA: Diagnosis not present

## 2019-12-27 DIAGNOSIS — K649 Unspecified hemorrhoids: Secondary | ICD-10-CM | POA: Diagnosis not present

## 2019-12-27 DIAGNOSIS — G4700 Insomnia, unspecified: Secondary | ICD-10-CM | POA: Diagnosis not present

## 2019-12-27 DIAGNOSIS — I2510 Atherosclerotic heart disease of native coronary artery without angina pectoris: Secondary | ICD-10-CM | POA: Diagnosis not present

## 2019-12-27 DIAGNOSIS — U071 COVID-19: Secondary | ICD-10-CM | POA: Diagnosis not present

## 2019-12-27 DIAGNOSIS — I110 Hypertensive heart disease with heart failure: Secondary | ICD-10-CM | POA: Diagnosis not present

## 2019-12-27 DIAGNOSIS — I700 Atherosclerosis of aorta: Secondary | ICD-10-CM | POA: Diagnosis not present

## 2019-12-27 DIAGNOSIS — G4733 Obstructive sleep apnea (adult) (pediatric): Secondary | ICD-10-CM | POA: Diagnosis not present

## 2019-12-27 DIAGNOSIS — I872 Venous insufficiency (chronic) (peripheral): Secondary | ICD-10-CM | POA: Diagnosis not present

## 2019-12-27 DIAGNOSIS — E871 Hypo-osmolality and hyponatremia: Secondary | ICD-10-CM | POA: Diagnosis not present

## 2019-12-27 DIAGNOSIS — F410 Panic disorder [episodic paroxysmal anxiety] without agoraphobia: Secondary | ICD-10-CM | POA: Diagnosis not present

## 2019-12-27 DIAGNOSIS — M810 Age-related osteoporosis without current pathological fracture: Secondary | ICD-10-CM | POA: Diagnosis not present

## 2019-12-27 DIAGNOSIS — I083 Combined rheumatic disorders of mitral, aortic and tricuspid valves: Secondary | ICD-10-CM | POA: Diagnosis not present

## 2019-12-27 DIAGNOSIS — I4891 Unspecified atrial fibrillation: Secondary | ICD-10-CM | POA: Diagnosis not present

## 2019-12-27 DIAGNOSIS — J449 Chronic obstructive pulmonary disease, unspecified: Secondary | ICD-10-CM | POA: Diagnosis not present

## 2019-12-27 DIAGNOSIS — H409 Unspecified glaucoma: Secondary | ICD-10-CM | POA: Diagnosis not present

## 2019-12-27 DIAGNOSIS — K5730 Diverticulosis of large intestine without perforation or abscess without bleeding: Secondary | ICD-10-CM | POA: Diagnosis not present

## 2019-12-27 DIAGNOSIS — I440 Atrioventricular block, first degree: Secondary | ICD-10-CM | POA: Diagnosis not present

## 2019-12-27 DIAGNOSIS — E039 Hypothyroidism, unspecified: Secondary | ICD-10-CM | POA: Diagnosis not present

## 2019-12-27 DIAGNOSIS — M545 Low back pain: Secondary | ICD-10-CM | POA: Diagnosis not present

## 2019-12-27 DIAGNOSIS — M069 Rheumatoid arthritis, unspecified: Secondary | ICD-10-CM | POA: Diagnosis not present

## 2019-12-27 MED ORDER — ONDANSETRON HCL 4 MG PO TABS
4.0000 mg | ORAL_TABLET | Freq: Three times a day (TID) | ORAL | 3 refills | Status: DC | PRN
Start: 2019-12-27 — End: 2020-01-16

## 2019-12-27 NOTE — Telephone Encounter (Signed)
Pt's daughter called and asked if I could send records to [facility]. I faxed last ov and discharge summary.

## 2019-12-28 LAB — ECHO TEE
AR max vel: 1.05 cm2
AV Area VTI: 0.72 cm2
AV Area mean vel: 1.02 cm2
AV Mean grad: 10.8 mmHg
AV Peak grad: 19.8 mmHg
Ao pk vel: 2.22 m/s

## 2019-12-30 DIAGNOSIS — I4891 Unspecified atrial fibrillation: Secondary | ICD-10-CM | POA: Diagnosis not present

## 2019-12-30 DIAGNOSIS — I0981 Rheumatic heart failure: Secondary | ICD-10-CM | POA: Diagnosis not present

## 2019-12-30 DIAGNOSIS — I5032 Chronic diastolic (congestive) heart failure: Secondary | ICD-10-CM | POA: Diagnosis not present

## 2019-12-30 DIAGNOSIS — U071 COVID-19: Secondary | ICD-10-CM | POA: Diagnosis not present

## 2019-12-30 DIAGNOSIS — Z48812 Encounter for surgical aftercare following surgery on the circulatory system: Secondary | ICD-10-CM | POA: Diagnosis not present

## 2019-12-30 DIAGNOSIS — I110 Hypertensive heart disease with heart failure: Secondary | ICD-10-CM | POA: Diagnosis not present

## 2019-12-31 ENCOUNTER — Other Ambulatory Visit: Payer: Self-pay | Admitting: *Deleted

## 2019-12-31 ENCOUNTER — Telehealth: Payer: Self-pay

## 2019-12-31 DIAGNOSIS — I0981 Rheumatic heart failure: Secondary | ICD-10-CM | POA: Diagnosis not present

## 2019-12-31 DIAGNOSIS — Z48812 Encounter for surgical aftercare following surgery on the circulatory system: Secondary | ICD-10-CM | POA: Diagnosis not present

## 2019-12-31 DIAGNOSIS — I110 Hypertensive heart disease with heart failure: Secondary | ICD-10-CM | POA: Diagnosis not present

## 2019-12-31 DIAGNOSIS — I4891 Unspecified atrial fibrillation: Secondary | ICD-10-CM | POA: Diagnosis not present

## 2019-12-31 DIAGNOSIS — U071 COVID-19: Secondary | ICD-10-CM | POA: Diagnosis not present

## 2019-12-31 DIAGNOSIS — I5032 Chronic diastolic (congestive) heart failure: Secondary | ICD-10-CM | POA: Diagnosis not present

## 2019-12-31 NOTE — Progress Notes (Signed)
Primary Physician/Referring:  [name], MD  [name], female    DOB: 01-10-1933, 86 y.o.   MRN: [id number]  Chief Complaint  Patient presents with  . AAA  . Follow-up   HPI:    Lisa Mcclure  is a 86 y.o.  female  with past medical history of obesity, chronic diastolic CHF, hypertensive heart disease, HTN, anemia, hemorrhoids, depression, diverticulosis, esophageal reflux, OSA intolerant to CPAP on nocturnal 02, rheumatoid arthritis, and venous insufficiency. CT scan of the chest 03/2016 without definitive ILD. LM and 2 vessel CAD, mild calcification on mitral and aortic valve. S/p AAA repair on 12/20/2019.   Patient presented to the emergency department on 12/20/2019 after abdominal CT revealed impending rupture of AAA, with abdominal pain. She was found to be hypertensive and with new onset atrial fibrillation with RVR.  Patient underwent endovascular repair of infrarenal abdominal aortic aneurysm with bifurcated stent graft on 12/20/2019.  Patient remained in atrial fibrillation, therefore she underwent TEE cardioversion on 12/24/2019 with successful conversion to normal sinus rhythm.  Presents for hospital follow up.  She is recuperating well after AAA repair.  Denies chest pain, orthopnea, swelling, PND, dizziness, syncope.  Does report continued dyspnea on exertion which is chronic and stable for her.  She reports she has been struggling to keep track of all the medication she is on.  She also complains of urinary incontinence made worse by current use of furosemide.  She also reports she continues to have mildly decreased appetite, but it is improving.  She is currently receiving home health services.   Past Medical History:  Diagnosis Date  . Anemia    h/o hemorrhoidal bleeding and blood transfusion  . Cardiomegaly   . Chronic diastolic CHF (congestive heart failure) ([place])   . Depression   . Diverticulosis   . DOE (dyspnea on exertion)   .  Esophageal reflux   . GERD (gastroesophageal reflux disease)   . Hypothyroidism   . Insomnia   . LBP (low back pain)   . OAB (overactive bladder)   . Obesity   . OSA (obstructive sleep apnea)   . Osteoarthritis   . Rheumatoid arthritis(714.0)   . Shoulder pain, bilateral   . Unspecified essential hypertension   . Venous insufficiency    Past Surgical History:  Procedure Laterality Date  . ABDOMINAL AORTIC ENDOVASCULAR STENT GRAFT N/A 12/20/2019   Procedure: Aortogram including catheter selection of aorta and bilateral iliac arteriogram, Endovascular repair of infrarenal abdominal aortic aneurysm with bifurcated stent graft (26 mm x 14 x 12 main body, right bell bottom with a 20 mm x 10 cm piece, and left bell bottom with a 16 mm x 12 cm piece) ;  Surgeon: Marty Heck, MD;  Location: [place];  Service: Vascular;  Laterality: N/A;  . APPENDECTOMY  1953  . bladder abduction-1996  1996  . breast biopsy Right 1980  . CARDIOVERSION N/A 12/24/2019   Procedure: CARDIOVERSION;  Surgeon: Adrian Prows, MD;  Location: [facility] ENDOSCOPY;  Service: Cardiovascular;  Laterality: N/A;  . [place]  . COSMETIC SURGERY  1996  . CYSTOCELE REPAIR    . FLEXIBLE SIGMOIDOSCOPY N/A 04/10/2015   Procedure: FLEXIBLE SIGMOIDOSCOPY;  Surgeon: Arta Silence, MD;  Location: [facility] ENDOSCOPY;  Service: Endoscopy;  Laterality: N/A;  . knee arthroscopy Right 1996, 2010  . KNEE ARTHROSCOPY W/ AUTOGENOUS CARTILAGE IMPLANTATION (ACI) PROCEDURE Left 1994, 1995  . SHOULDER SURGERY  1990  .  TEE WITHOUT CARDIOVERSION N/A 12/24/2019   Procedure: TRANSESOPHAGEAL ECHOCARDIOGRAM (TEE);  Surgeon: Adrian Prows, MD;  Location: [place];  Service: Cardiovascular;  Laterality: N/A;  . TOTAL ABDOMINAL HYSTERECTOMY  1972  . ULTRASOUND GUIDANCE FOR VASCULAR ACCESS Bilateral 12/20/2019   Procedure: Ultrasound-guided access of bilateral common femoral arteries for delivery of endograft and percutaneous closure;   Surgeon: Marty Heck, MD;  Location: [facility] OR;  Service: Vascular;  Laterality: Bilateral;  . VESICOVAGINAL FISTULA CLOSURE W/ TAH    . WRIST SURGERY  1967   Family History  Problem Relation Age of Onset  . COPD Father   . Lung cancer Mother   . Heart attack Maternal Grandmother 80  . Breast cancer Maternal Aunt   . Lung cancer Son     Social History   Tobacco Use  . Smoking status: Former Smoker    Packs/day: 0.20    Years: 10.00    Pack years: 2.00    Types: Cigarettes    Quit date: 04/11/1984    Years since quitting: 35.7  . Smokeless tobacco: Never Used  Substance Use Topics  . Alcohol use: Yes    Alcohol/week: 0.0 standard drinks    Comment: cocktail once a week   Marital Status: Widowed  ROS  Review of Systems  Cardiovascular: Positive for dyspnea on exertion (Chronic, stable). Negative for chest pain, leg swelling, orthopnea, paroxysmal nocturnal dyspnea and syncope.  Hematologic/Lymphatic: Does not bruise/bleed easily.  Musculoskeletal: Positive for back pain and joint pain (bilateral knees).  Gastrointestinal: Negative for melena.  Neurological: Negative for dizziness, headaches and light-headedness.   Objective  Blood pressure (!) 147/61, pulse (!) 55, height 5' 3.5" (1.613 m), weight 201 lb (91.2 kg), SpO2 100 %.  Vitals with BMI 01/02/2020 12/25/2019 12/25/2019  Height 5' 3.5" - -  Weight 201 lbs - -  BMI 26.94 - -  Systolic 854 627 035  Diastolic 61 61 73  Pulse 55 76 76     Physical Exam Vitals reviewed.  Constitutional:      General: She is not in acute distress.    Appearance: She is well-developed.  Cardiovascular:     Rate and Rhythm: Normal rate and regular rhythm.     Pulses: Intact distal pulses.          Carotid pulses are 2+ on the right side and 2+ on the left side.      Radial pulses are 2+ on the right side and 2+ on the left side.       Dorsalis pedis pulses are 1+ on the right side and 1+ on the left side.       Posterior tibial  pulses are 1+ on the right side and 1+ on the left side.     Heart sounds: S1 normal and S2 normal. No murmur heard.  Harsh early systolic murmur is present at the upper right sternal border radiating to the apex.  No gallop.      Comments: Trace bilateral ankle edema. No JVD.  Pulmonary:     Effort: Pulmonary effort is normal. No accessory muscle usage or respiratory distress.     Breath sounds: Normal breath sounds.  Abdominal:     General: Bowel sounds are normal.     Palpations: Abdomen is soft.  Neurological:     Mental Status: She is alert.    Laboratory examination:   Recent Labs    12/23/19 0350 12/24/19 0627 12/25/19 0339  NA 138 134* 134*  K  4.7 4.2 3.9  CL 100 97* 97*  CO2 31 27 28   GLUCOSE 89 109* 100*  BUN 17 19 18   CREATININE 0.96 0.89 0.87  CALCIUM 8.3* 8.4* 8.4*  GFRNONAA 54* 60* >60  GFRAA >60 >60 >60   estimated creatinine clearance is 52.1 mL/min (by C-G formula based on SCr of 0.87 mg/dL).  CMP Latest Ref Rng & Units 12/25/2019 12/24/2019 12/23/2019  Glucose 70 - 99 mg/dL 100(H) 109(H) 89  BUN 8 - 23 mg/dL 18 19 17   Creatinine 0.44 - 1.00 mg/dL 0.87 0.89 0.96  Sodium 135 - 145 mmol/L 134(L) 134(L) 138  Potassium 3.5 - 5.1 mmol/L 3.9 4.2 4.7  Chloride 98 - 111 mmol/L 97(L) 97(L) 100  CO2 22 - 32 mmol/L 28 27 31   Calcium 8.9 - 10.3 mg/dL 8.4(L) 8.4(L) 8.3(L)  Total Protein 6.5 - 8.1 g/dL - - 7.1  Total Bilirubin 0.3 - 1.2 mg/dL - - 0.5  Alkaline Phos 38 - 126 U/L - - 70  AST 15 - 41 U/L - - 39  ALT 0 - 44 U/L - - 36   CBC Latest Ref Rng & Units 12/25/2019 12/24/2019 12/23/2019  WBC 4.0 - 10.5 K/uL 8.8 10.5 12.4(H)  Hemoglobin 12.0 - 15.0 g/dL 9.1(L) 9.1(L) 9.9(L)  Hematocrit 36 - 46 % 30.7(L) 30.0(L) 32.5(L)  Platelets 150 - 400 K/uL 309 317 354   Lipid Panel    Component Value Date/Time   CHOL 126 03/14/2019 1121   TRIG 82 03/14/2019 1121   HDL 49 03/14/2019 1121   CHOLHDL 5.1 CALC 12/19/2006 0916   VLDL 26 12/19/2006 0916   LDLCALC 61  03/14/2019 1121   LDLDIRECT 159.2 12/19/2006 0916    HEMOGLOBIN A1C No results found for: HGBA1C, MPG TSH Recent Labs    12/23/19 1424  TSH 0.724    External labs:   Cholesterol, total 164.000 m 06/21/2019 Triglycerides 93.000 06/21/2019 HDL 46 MG/DL 06/21/2019 LDL 99.000 mg 06/21/2019  Glucose Random 87.000 mg 06/21/2019 BUN 25.000 mg 06/21/2019 Creatinine, Serum 0.800 mg/ 06/21/2019  TSH 3.220 micr 06/21/2019  AST 18, ALT 11 09/14/2018  FOBT: Normal 04/24/2018  MicroAlbumin Urine 13.000 03/23/2017 MicroAlbumin/Creat 37.9 MG/ 03/23/2017  Medications and allergies   Allergies  Allergen Reactions  . Statins Other (See Comments)    Muscle aches and INTERNAL BLEEDING  . Gabapentin Other (See Comments)    "Made me loopy"  . Methocarbamol Other (See Comments)    "Made me loopy"     Current Outpatient Medications  Medication Instructions  . amiodarone (PACERONE) 100 mg, Oral, Daily  . amLODipine (NORVASC) 5 mg, Oral, Daily  . apixaban (ELIQUIS) 5 mg, Oral, 2 times daily  . aspirin 81 mg, Oral, Daily, Swallow whole.  . Bempedoic Acid-Ezetimibe (NEXLIZET) 180-10 MG TABS 1 capsule, Oral, Daily  . carvedilol (COREG) 12.5 mg, Oral, 2 times daily  . diclofenac sodium (VOLTAREN) 2.25 g, Topical, 3 times daily PRN  . fluticasone (FLONASE) 50 MCG/ACT nasal spray 2 sprays, Each Nare, Daily PRN  . hydrALAZINE (APRESOLINE) 25 MG tablet TAKE 1 TABLET 3 TIMES A DAY  . hydrochlorothiazide (MICROZIDE) 12.5 mg, Oral, Daily  . HYDROcodone-acetaminophen (NORCO) 7.5-325 MG tablet 1 tablet, Oral, Every 6 hours PRN  . ipratropium (ATROVENT) 0.03 % nasal spray 2 sprays, Each Nare, 3 times daily  . isosorbide dinitrate (ISORDIL) 30 mg, Oral, 3 times daily  . levothyroxine (SYNTHROID) 125 mcg, Oral, Daily before breakfast  . losartan (COZAAR) 100 MG tablet TAKE 1 TABLET(100 MG) BY  MOUTH TWICE DAILY  . Multiple Vitamins-Minerals (PRESERVISION AREDS 2+MULTI VIT PO) Oral  . omeprazole (PRILOSEC)  20 mg, Oral, 2 times daily before meals  . ondansetron (ZOFRAN) 4 mg, Oral, Every 8 hours PRN  . rOPINIRole (REQUIP) 1 mg, Oral, See admin instructions, Take 1 mg by mouth once a day between 3 PM-4 PM and an additional 1 mg in the evening, if no relief  . Vitamin D3 5,000 Units, Oral, Daily with breakfast   Medications Discontinued During This Encounter  Medication Reason  . furosemide (LASIX) 20 MG tablet Change in therapy  . metoprolol succinate (TOPROL-XL) 50 MG 24 hr tablet Change in therapy  . amiodarone (PACERONE) 200 MG tablet Change in therapy  . amLODipine (NORVASC) 5 MG tablet Reorder  . isosorbide dinitrate (ISORDIL) 30 MG tablet Reorder    Radiology:   No results found. CT Abdomen and Pelvis W Contrast 12/20/2019: IMPRESSION: 1. Large Infrarenal Abdominal Aortic Aneurysm measuring up to 5.7 cm with mild perianeurysmal inflammation. Differential considerations include impending AAA rupture, mycotic AAA, symptomatic AAA. Recommend urgent transfer to emergency department and Vascular Surgery consultation. Aortic aneurysm NOS (ICD10-I71.9). Aortic Atherosclerosis (ICD10-I70.0). 2. No other acute or inflammatory process identified. 5 cm fat containing left pelvic hernia. Large bowel diverticulosis. 3. Simple appearing 4 cm left ovarian cyst, recommend follow-up Ultrasound in 6-12 months  Chest X-ray 12/20/2019:  FINDINGS: Diffuse cardiac enlargement. Emphysematous changes in the lungs. Coarse interstitial pattern to the lungs could represent fibrosis or edema. No focal consolidation. No pleural effusions. No pneumothorax. Calcified and tortuous aorta. Degenerative changes in the shoulders and spine. Old rib fractures. Similar appearance to previous study. IMPRESSION: 1. Cardiac enlargement. 2. Coarse interstitial pattern to the lungs could represent fibrosis or edema.  Abdominal X-Ray 12/20/2019:  IMPRESSION: Interval placement of aorto iliac stent graft. Nonobstructive  bowel gas pattern.  Cardiac Studies:   Lexiscan myoview stress test 12/08/2017: 1. Lexiscan stress test was performed. Exercise capacity was not assessed. Stress symptoms included abdominal pain. Blood pressure was normal. The resting and stress electrocardiogram demonstrated normal sinus rhythm, normal resting conduction, no resting arrhythmias and normal rest repolarization. 2. The overall quality of the study is good. There is no evidence of abnormal lung activity. Stress and rest SPECT images demonstrate homogeneous tracer distribution throughout the myocardium. Gated SPECT imaging reveals normal myocardial thickening and wall motion. The left ventricular ejection fraction was normal (54%). 3. Low risk study  Renal artery duplex 09/27/2017: Hemodynamically significant stenosis bilaterally. Renal length is within normal limits for both kidneys. There is an incidental 3.9x3.4x3.4 cm proximal and mid abdominal aortic aneurysm. Recommend dedicated abdominal aortic duplex scan to further define AAA.  Abdominal Aortic Duplex  01/01/2019: Moderate dilatation of the abdominal aorta is noted in the proximal, mid and distal aorta. An abdominal aortic aneurysm measuring 4.5 x 4.52 x 4.52 cm is seen.  Recheck in 6 months for stability of the aneurysm.  Echocardiogram 01/25/2019: Left ventricle cavity is normal in size. Severe concentric hypertrophy of the left ventricle. Normal LV systolic function with visual EF 50-55%. Normal global wall motion. Doppler evidence of grade I (impaired) diastolic dysfunction, normal LAP. Calculated EF 47%. Left atrial cavity is mildly dilated. Trileaflet aortic valve with mild calcification of the aortic valve annulus and leaflets.  Mild aortic valve stenosis. Aortic valve mean gradient of 12 mmHg, Vmax of 2.3  m/s. Calculated aortic valve area by continuity equation is 1.3 cm. Trace aortic regurgitation. Mild (Grade I) mitral regurgitation. Mild tricuspid  regurgitation. Estimated pulmonary artery systolic pressure is 25 mmHg. No significant change compared to previous study on 09/27/2017.  TEE 12/24/2019: 1. Left ventricular ejection fraction, by estimation, is 50 to 55%. The left ventricle has low normal function. Left ventricular diastolic function could not be evaluated.  2. Right ventricular systolic function is normal. The right ventricular size is normal.  3. Left atrial size was moderately dilated. No left atrial/left atrial appendage thrombus was detected. The LAA emptying velocity was 30 cm/s.  4. The mitral valve is normal in structure. Mild mitral valve regurgitation. No evidence of mitral stenosis.  5. The aortic valve is calcified. Aortic valve regurgitation is not visualized. Mild aortic valve stenosis. Aortic valve mean gradient measures 10.8 mmHg.  6. There is Moderate (Grade III) layered plaque involving the ascending, transverse and descending aorta.   EKG EKG 01/02/2020: Sinus bradycardia at a rate of 54 bpm with borderline first-degree AV block.  Normal axis.  Poor R wave progression cannot exclude anterior infarct old. Compared to 01/08/2020, bradycardia new.   01/08/2019: Normal sinus rhythm at 63 bpm with first degree AV block, normal axis, no evidence of ischemia.   Assessment     ICD-10-CM   1. AAA (abdominal aortic aneurysm) without rupture (HCC)  I71.4 EKG 12-Lead  2. Primary hypertension  I10 hydrochlorothiazide (MICROZIDE) 12.5 MG capsule    carvedilol (COREG) 12.5 MG tablet    amLODipine (NORVASC) 5 MG tablet    isosorbide dinitrate (ISORDIL) 30 MG tablet    Basic metabolic panel    CANCELED: Basic metabolic panel  3. Paroxysmal atrial fibrillation (HCC)  I48.0 carvedilol (COREG) 12.5 MG tablet    amiodarone (PACERONE) 100 MG tablet  4. Chronic diastolic (congestive) heart failure (HCC)  I50.32 EKG 12-Lead    carvedilol (COREG) 12.5 MG tablet    Basic metabolic panel    CANCELED: Basic metabolic panel    5. Hyperlipidemia, group A  E78.00   6. Urinary incontinence, unspecified type  R32      Recommendations:   Lisa Mcclure  is a 86 y.o.  female  with past medical history of obesity, chronic diastolic CHF, hypertensive heart disease, HTN, anemia, hemorrhoids, depression, diverticulosis, esophageal reflux, OSA intolerant to CPAP on nocturnal 02, rheumatoid arthritis, and venous insufficiency. CT scan of the chest 03/2016 without definitive ILD. LM and 2 vessel CAD, mild calcification on mitral and aortic valve. S/p AAA repair on 12/20/2019.    Patient is presently doing well with no clinical signs of heart failure, no angina, and stable chronic shortness of breath.  Patient's primary concern today is that at baseline she is incontinent and taking her current diuretic medications makes it significantly worse.  Patient reports she is limiting activity outside the house, which is very bothersome to her, due to urinary incontinence and high urine output.  As there are no current clinical signs of heart failure will stop furosemide 20 mg daily and start hydrochlorothiazide 12.5 mg daily.  Instructed patient to monitor for signs and symptoms of volume overload and to let our office know if this occurs.  Will obtain BMP in 1 week.  Blood pressure is elevated today, but in view of EKG with significant sinus bradycardia, suspect blood pressure will improve with increase in heart rate.  Will stop metoprolol succinate 50 mg once daily and start carvedilol 12.5 twice daily.  Will also continue amiodarone 100 mg daily.  She will need annual TSH and liver function testing.  Patient and daughter had  several questions about current medications and home health access.  Did medication reconciliation and discussed at length.  Patient and daughter expressed understanding.  Reviewed external labs, renal function is stable and lipids are well controlled.  Will continue next Nexlizet 180-10 mg daily.   Patient has  follow-up scheduled with vascular surgery next month.  Encouraged her to also follow-up with PCP as Dr.Patterson is managing her noncardiovascular comorbidities.  Of note patient's hemoglobin on 12/25/2019 was 9.1, encouraged her to follow-up with primary care.  Also encouraged patient continue to focus on maintaining heart healthy diet.  Follow-up in 2 weeks for hypertension, heart failure.  Patient was seen in collaboration with Dr. Einar Gip. He also reviewed patient's chart Dr. Einar Gip is in agreement of the plan.   This was a 60-minute encounter with face-to-face counseling, medication reconciliation, medical records review, coordination of care, explanation of complex medical issues, complex medical decision making.    Alethia Berthold, PA-C 01/02/2020, 11:12 AM Office: [phone]

## 2019-12-31 NOTE — Patient Outreach (Signed)
[place] [facility] [facility]) Care Management  12/31/2019  [name] [birth year] [id number]  Telephone outreach to follow up on RED FLAG EMMI DISCHARGE CALL: Reports has lost interest in activities of usual enjoyment and has questions.  First attempt Mrs. Daughertry was on the phone with her grandson. NP offered to call later.  Second attempt: Pt was with her PT.  Advised will call in the am.  Kayleen Memos C. Myrtie Neither, MSN, [facility] Gerontological Nurse Practitioner [facility] Care Management [phone]

## 2019-12-31 NOTE — Telephone Encounter (Signed)
Beth a home care nurse called regarding this patient that her heart rate was between 52 - 54 and wanted to make you aware of it and if you have any questions feel free to contact her at [phone]

## 2019-12-31 NOTE — Telephone Encounter (Signed)
Reduce Amiodarone to 200 mg 1/2 tablet daily

## 2020-01-01 ENCOUNTER — Other Ambulatory Visit: Payer: Self-pay | Admitting: *Deleted

## 2020-01-01 DIAGNOSIS — I0981 Rheumatic heart failure: Secondary | ICD-10-CM | POA: Diagnosis not present

## 2020-01-01 DIAGNOSIS — I5032 Chronic diastolic (congestive) heart failure: Secondary | ICD-10-CM | POA: Diagnosis not present

## 2020-01-01 DIAGNOSIS — I110 Hypertensive heart disease with heart failure: Secondary | ICD-10-CM | POA: Diagnosis not present

## 2020-01-01 DIAGNOSIS — I4891 Unspecified atrial fibrillation: Secondary | ICD-10-CM | POA: Diagnosis not present

## 2020-01-01 DIAGNOSIS — U071 COVID-19: Secondary | ICD-10-CM | POA: Diagnosis not present

## 2020-01-01 DIAGNOSIS — Z48812 Encounter for surgical aftercare following surgery on the circulatory system: Secondary | ICD-10-CM | POA: Diagnosis not present

## 2020-01-01 NOTE — Patient Outreach (Signed)
[facility]) Care Management  01/01/2020  Lisa Mcclure 21-Dec-1932 [id number]  Second attempt to follow up on RED FLAG on EMMI CALL.  Pt was with nurse today. Spoke with pt's daughter [name]. DPR. She reports her mother has all the resources she needs. Home health is active and [facility] is coming today for an initial visit. We agreed [facility] is probably not needed at this time, however, I will send a letter so that they are familiar with our services for future needs.  [name]. Myrtie Neither, MSN, [facility] Gerontological Nurse Practitioner [facility] Care Management [phone]

## 2020-01-02 ENCOUNTER — Encounter: Payer: Self-pay | Admitting: Student

## 2020-01-02 ENCOUNTER — Other Ambulatory Visit: Payer: Self-pay

## 2020-01-02 ENCOUNTER — Ambulatory Visit: Payer: Medicare Other | Admitting: Student

## 2020-01-02 VITALS — BP 147/61 | HR 55 | Ht 63.5 in | Wt 201.0 lb

## 2020-01-02 DIAGNOSIS — I480 Paroxysmal atrial fibrillation: Secondary | ICD-10-CM | POA: Diagnosis not present

## 2020-01-02 DIAGNOSIS — I10 Essential (primary) hypertension: Secondary | ICD-10-CM

## 2020-01-02 DIAGNOSIS — E7800 Pure hypercholesterolemia, unspecified: Secondary | ICD-10-CM

## 2020-01-02 DIAGNOSIS — I5032 Chronic diastolic (congestive) heart failure: Secondary | ICD-10-CM

## 2020-01-02 DIAGNOSIS — I714 Abdominal aortic aneurysm, without rupture, unspecified: Secondary | ICD-10-CM

## 2020-01-02 DIAGNOSIS — R32 Unspecified urinary incontinence: Secondary | ICD-10-CM | POA: Diagnosis not present

## 2020-01-02 MED ORDER — HYDROCHLOROTHIAZIDE 12.5 MG PO CAPS: 12.5000 mg | ORAL_CAPSULE | Freq: Every day | ORAL | 3 refills | Status: DC

## 2020-01-02 MED ORDER — AMLODIPINE BESYLATE 5 MG PO TABS: 5.0000 mg | ORAL_TABLET | Freq: Every day | ORAL | 0 refills | Status: DC

## 2020-01-02 MED ORDER — ISOSORBIDE DINITRATE 30 MG PO TABS: 30.0000 mg | ORAL_TABLET | Freq: Three times a day (TID) | ORAL | 3 refills | Status: DC

## 2020-01-02 MED ORDER — CARVEDILOL 12.5 MG PO TABS: 12.5000 mg | ORAL_TABLET | Freq: Two times a day (BID) | ORAL | 3 refills | Status: DC

## 2020-01-02 MED ORDER — AMIODARONE HCL 100 MG PO TABS: 100.0000 mg | ORAL_TABLET | Freq: Every day | ORAL | 3 refills | Status: DC

## 2020-01-03 ENCOUNTER — Telehealth: Payer: Self-pay | Admitting: Pharmacist

## 2020-01-03 ENCOUNTER — Other Ambulatory Visit: Payer: Medicare Other

## 2020-01-03 DIAGNOSIS — I110 Hypertensive heart disease with heart failure: Secondary | ICD-10-CM | POA: Diagnosis not present

## 2020-01-03 DIAGNOSIS — I5032 Chronic diastolic (congestive) heart failure: Secondary | ICD-10-CM | POA: Diagnosis not present

## 2020-01-03 DIAGNOSIS — Z48812 Encounter for surgical aftercare following surgery on the circulatory system: Secondary | ICD-10-CM | POA: Diagnosis not present

## 2020-01-03 DIAGNOSIS — I0981 Rheumatic heart failure: Secondary | ICD-10-CM | POA: Diagnosis not present

## 2020-01-03 DIAGNOSIS — U071 COVID-19: Secondary | ICD-10-CM | POA: Diagnosis not present

## 2020-01-03 DIAGNOSIS — I4891 Unspecified atrial fibrillation: Secondary | ICD-10-CM | POA: Diagnosis not present

## 2020-01-06 ENCOUNTER — Telehealth: Payer: Self-pay | Admitting: Cardiology

## 2020-01-06 DIAGNOSIS — H353114 Nonexudative age-related macular degeneration, right eye, advanced atrophic with subfoveal involvement: Secondary | ICD-10-CM | POA: Diagnosis not present

## 2020-01-06 DIAGNOSIS — Z48812 Encounter for surgical aftercare following surgery on the circulatory system: Secondary | ICD-10-CM | POA: Diagnosis not present

## 2020-01-06 DIAGNOSIS — I4891 Unspecified atrial fibrillation: Secondary | ICD-10-CM | POA: Diagnosis not present

## 2020-01-06 DIAGNOSIS — I0981 Rheumatic heart failure: Secondary | ICD-10-CM | POA: Diagnosis not present

## 2020-01-06 DIAGNOSIS — I5032 Chronic diastolic (congestive) heart failure: Secondary | ICD-10-CM

## 2020-01-06 DIAGNOSIS — I110 Hypertensive heart disease with heart failure: Secondary | ICD-10-CM | POA: Diagnosis not present

## 2020-01-06 DIAGNOSIS — U071 COVID-19: Secondary | ICD-10-CM | POA: Diagnosis not present

## 2020-01-06 DIAGNOSIS — H43813 Vitreous degeneration, bilateral: Secondary | ICD-10-CM | POA: Diagnosis not present

## 2020-01-06 DIAGNOSIS — I480 Paroxysmal atrial fibrillation: Secondary | ICD-10-CM | POA: Diagnosis not present

## 2020-01-06 DIAGNOSIS — I10 Essential (primary) hypertension: Secondary | ICD-10-CM

## 2020-01-06 DIAGNOSIS — R0600 Dyspnea, unspecified: Secondary | ICD-10-CM

## 2020-01-06 DIAGNOSIS — R0609 Other forms of dyspnea: Secondary | ICD-10-CM

## 2020-01-06 DIAGNOSIS — H35033 Hypertensive retinopathy, bilateral: Secondary | ICD-10-CM | POA: Diagnosis not present

## 2020-01-06 DIAGNOSIS — H353221 Exudative age-related macular degeneration, left eye, with active choroidal neovascularization: Secondary | ICD-10-CM | POA: Diagnosis not present

## 2020-01-06 NOTE — Telephone Encounter (Signed)
I have reviewed patient chart, home health plan of care and certify that they are accurate to the best of my knowledge and needed for patient safety, health and to reduce recurrent hospitalization.    ICD-10-CM   1. Chronic diastolic (congestive) heart failure (HCC)  I50.32   2. Paroxysmal atrial fibrillation (HCC)  I48.0   3. Primary hypertension  I10   4. Dyspnea on exertion  R06.00       Adrian Prows, MD, [facility] 01/06/2020, 9:33 AM Office: [phone]

## 2020-01-07 DIAGNOSIS — I110 Hypertensive heart disease with heart failure: Secondary | ICD-10-CM | POA: Diagnosis not present

## 2020-01-07 DIAGNOSIS — Z48812 Encounter for surgical aftercare following surgery on the circulatory system: Secondary | ICD-10-CM | POA: Diagnosis not present

## 2020-01-07 DIAGNOSIS — I0981 Rheumatic heart failure: Secondary | ICD-10-CM | POA: Diagnosis not present

## 2020-01-07 DIAGNOSIS — J449 Chronic obstructive pulmonary disease, unspecified: Secondary | ICD-10-CM | POA: Diagnosis not present

## 2020-01-07 DIAGNOSIS — I5032 Chronic diastolic (congestive) heart failure: Secondary | ICD-10-CM | POA: Diagnosis not present

## 2020-01-07 DIAGNOSIS — U071 COVID-19: Secondary | ICD-10-CM | POA: Diagnosis not present

## 2020-01-07 DIAGNOSIS — I4891 Unspecified atrial fibrillation: Secondary | ICD-10-CM | POA: Diagnosis not present

## 2020-01-08 DIAGNOSIS — I714 Abdominal aortic aneurysm, without rupture: Secondary | ICD-10-CM | POA: Diagnosis not present

## 2020-01-08 DIAGNOSIS — I110 Hypertensive heart disease with heart failure: Secondary | ICD-10-CM | POA: Diagnosis not present

## 2020-01-08 DIAGNOSIS — I350 Nonrheumatic aortic (valve) stenosis: Secondary | ICD-10-CM | POA: Diagnosis not present

## 2020-01-08 DIAGNOSIS — E039 Hypothyroidism, unspecified: Secondary | ICD-10-CM | POA: Diagnosis not present

## 2020-01-08 DIAGNOSIS — G4733 Obstructive sleep apnea (adult) (pediatric): Secondary | ICD-10-CM | POA: Diagnosis not present

## 2020-01-08 DIAGNOSIS — K219 Gastro-esophageal reflux disease without esophagitis: Secondary | ICD-10-CM | POA: Diagnosis not present

## 2020-01-08 DIAGNOSIS — I4891 Unspecified atrial fibrillation: Secondary | ICD-10-CM | POA: Diagnosis not present

## 2020-01-08 DIAGNOSIS — I5032 Chronic diastolic (congestive) heart failure: Secondary | ICD-10-CM | POA: Diagnosis not present

## 2020-01-08 NOTE — Progress Notes (Signed)
Patient's kidney function mildly abnormal, will continue to monitor. Sodium remains slightly low but stable. Want her taking losartan once daily, not twice. No other changes. Thank you!

## 2020-01-08 NOTE — Telephone Encounter (Signed)
Med list reviewed and updated. Pt interested in decreasing her pill burden. Will review further at next OV.

## 2020-01-08 NOTE — Progress Notes (Signed)
Called and reviewed lab results with pt's daughter and pt. Pt agreeable to decrease losartan intake to 100 mg once daily. Pt and daughter verbalized understanding. Encouraged pt to continue daily home BP checks. Pt interested in applying for patient assistance for Eliquis. Requested pt to bring the necessary documentation to next OV to completed the application together.

## 2020-01-09 DIAGNOSIS — I110 Hypertensive heart disease with heart failure: Secondary | ICD-10-CM | POA: Diagnosis not present

## 2020-01-09 DIAGNOSIS — I4891 Unspecified atrial fibrillation: Secondary | ICD-10-CM | POA: Diagnosis not present

## 2020-01-09 DIAGNOSIS — I0981 Rheumatic heart failure: Secondary | ICD-10-CM | POA: Diagnosis not present

## 2020-01-09 DIAGNOSIS — Z48812 Encounter for surgical aftercare following surgery on the circulatory system: Secondary | ICD-10-CM | POA: Diagnosis not present

## 2020-01-09 DIAGNOSIS — U071 COVID-19: Secondary | ICD-10-CM | POA: Diagnosis not present

## 2020-01-09 DIAGNOSIS — I10 Essential (primary) hypertension: Secondary | ICD-10-CM | POA: Diagnosis not present

## 2020-01-09 DIAGNOSIS — I5032 Chronic diastolic (congestive) heart failure: Secondary | ICD-10-CM | POA: Diagnosis not present

## 2020-01-10 DIAGNOSIS — H26492 Other secondary cataract, left eye: Secondary | ICD-10-CM | POA: Diagnosis not present

## 2020-01-10 DIAGNOSIS — H353132 Nonexudative age-related macular degeneration, bilateral, intermediate dry stage: Secondary | ICD-10-CM | POA: Diagnosis not present

## 2020-01-10 DIAGNOSIS — H04123 Dry eye syndrome of bilateral lacrimal glands: Secondary | ICD-10-CM | POA: Diagnosis not present

## 2020-01-10 DIAGNOSIS — H52203 Unspecified astigmatism, bilateral: Secondary | ICD-10-CM | POA: Diagnosis not present

## 2020-01-10 HISTORY — PX: REFRACTIVE SURGERY: SHX103

## 2020-01-14 ENCOUNTER — Encounter: Payer: Self-pay | Admitting: Cardiology

## 2020-01-14 DIAGNOSIS — I5032 Chronic diastolic (congestive) heart failure: Secondary | ICD-10-CM | POA: Diagnosis not present

## 2020-01-14 DIAGNOSIS — U071 COVID-19: Secondary | ICD-10-CM | POA: Diagnosis not present

## 2020-01-14 DIAGNOSIS — I4891 Unspecified atrial fibrillation: Secondary | ICD-10-CM | POA: Diagnosis not present

## 2020-01-14 DIAGNOSIS — Z48812 Encounter for surgical aftercare following surgery on the circulatory system: Secondary | ICD-10-CM | POA: Diagnosis not present

## 2020-01-14 DIAGNOSIS — I0981 Rheumatic heart failure: Secondary | ICD-10-CM | POA: Diagnosis not present

## 2020-01-14 DIAGNOSIS — I110 Hypertensive heart disease with heart failure: Secondary | ICD-10-CM | POA: Diagnosis not present

## 2020-01-15 DIAGNOSIS — Z48812 Encounter for surgical aftercare following surgery on the circulatory system: Secondary | ICD-10-CM | POA: Diagnosis not present

## 2020-01-15 DIAGNOSIS — I0981 Rheumatic heart failure: Secondary | ICD-10-CM | POA: Diagnosis not present

## 2020-01-15 DIAGNOSIS — I4891 Unspecified atrial fibrillation: Secondary | ICD-10-CM | POA: Diagnosis not present

## 2020-01-15 DIAGNOSIS — U071 COVID-19: Secondary | ICD-10-CM | POA: Diagnosis not present

## 2020-01-15 DIAGNOSIS — I5032 Chronic diastolic (congestive) heart failure: Secondary | ICD-10-CM | POA: Diagnosis not present

## 2020-01-15 DIAGNOSIS — I110 Hypertensive heart disease with heart failure: Secondary | ICD-10-CM | POA: Diagnosis not present

## 2020-01-15 NOTE — Progress Notes (Signed)
Primary Physician/Referring:  [name], MD  [name], female    DOB: 1932-09-16, 86 y.o.   MRN: [id number]  Chief Complaint  Patient presents with  . Hypertension  . Congestive Heart Failure  . Follow-up    2 week    HPI:    Lisa Mcclure  is a 86 y.o.  female  with past medical history of obesity, paroxysmal atrial fibrillation, chronic diastolic CHF, hypertensive heart disease, HTN, anemia, hemorrhoids, depression, diverticulosis, esophageal reflux, OSA intolerant to CPAP on nocturnal 02, rheumatoid arthritis, and venous insufficiency. CT scan of the chest 03/2016 without definitive ILD. LM and 2 vessel CAD, mild calcification on mitral and aortic valve. S/p AAA repair on 12/20/2019.   Patient presents for 2-week follow-up.  At last visit stopped furosemide 20 mg daily and started hydrochlorothiazide 12.5 mg daily, stopped metoprolol succinate 50 mg once daily and started carvedilol 12.5 mg twice daily.  Patient is presently feeling significantly better than during her last visit.  States urinary incontinence has improved since stopping furosemide.  Reports her appetite also continues to improve.  She does report chronic stable dyspnea on exertion.  Denies chest pain, orthopnea, swelling, PND, dizziness, syncope.  She continues to receive home health services and is doing physical therapy weekly.   Past Medical History:  Diagnosis Date  . Anemia    h/o hemorrhoidal bleeding and blood transfusion  . Cardiomegaly   . Chronic diastolic CHF (congestive heart failure) ([place])   . Depression   . Diverticulosis   . DOE (dyspnea on exertion)   . Esophageal reflux   . GERD (gastroesophageal reflux disease)   . Hypothyroidism   . Insomnia   . LBP (low back pain)   . OAB (overactive bladder)   . Obesity   . OSA (obstructive sleep apnea)   . Osteoarthritis   . Rheumatoid arthritis(714.0)   . Shoulder pain, bilateral   . Unspecified essential hypertension     . Venous insufficiency    Past Surgical History:  Procedure Laterality Date  . ABDOMINAL AORTIC ENDOVASCULAR STENT GRAFT N/A 12/20/2019   Procedure: Aortogram including catheter selection of aorta and bilateral iliac arteriogram, Endovascular repair of infrarenal abdominal aortic aneurysm with bifurcated stent graft (26 mm x 14 x 12 main body, right bell bottom with a 20 mm x 10 cm piece, and left bell bottom with a 16 mm x 12 cm piece) ;  Surgeon: Marty Heck, MD;  Location: [facility];  Service: Vascular;  Laterality: N/A;  . APPENDECTOMY  1953  . bladder abduction-1996  1996  . breast biopsy Right 1980  . CARDIOVERSION N/A 12/24/2019   Procedure: CARDIOVERSION;  Surgeon: Adrian Prows, MD;  Location: [facility] ENDOSCOPY;  Service: Cardiovascular;  Laterality: N/A;  . [place]  . COSMETIC SURGERY  1996  . CYSTOCELE REPAIR    . FLEXIBLE SIGMOIDOSCOPY N/A 04/10/2015   Procedure: FLEXIBLE SIGMOIDOSCOPY;  Surgeon: Arta Silence, MD;  Location[facility] ENDOSCOPY;  Service: Endoscopy;  Laterality: N/A;  . knee arthroscopy Right 1996, 2010  . KNEE ARTHROSCOPY W/ AUTOGENOUS CARTILAGE IMPLANTATION (ACI) PROCEDURE Left 1994, 1995  . SHOULDER SURGERY  1990  . TEE WITHOUT CARDIOVERSION N/A 12/24/2019   Procedure: TRANSESOPHAGEAL ECHOCARDIOGRAM (TEE);  Surgeon: Adrian Prows, MD;  Location: [place];  Service: Cardiovascular;  Laterality: N/A;  . TOTAL ABDOMINAL HYSTERECTOMY  1972  . ULTRASOUND GUIDANCE FOR VASCULAR ACCESS Bilateral 12/20/2019   Procedure: Ultrasound-guided access of  bilateral common femoral arteries for delivery of endograft and percutaneous closure;  Surgeon: Marty Heck, MD;  Location: [facility];  Service: Vascular;  Laterality: Bilateral;  . VESICOVAGINAL FISTULA CLOSURE W/ TAH    . WRIST SURGERY  1967   Family History  Problem Relation Age of Onset  . COPD Father   . Lung cancer Mother   . Heart attack Maternal Grandmother 80  . Breast cancer  Maternal Aunt   . Lung cancer Son     Social History   Tobacco Use  . Smoking status: Former Smoker    Packs/day: 0.20    Years: 10.00    Pack years: 2.00    Types: Cigarettes    Quit date: 04/11/1984    Years since quitting: 35.7  . Smokeless tobacco: Never Used  Substance Use Topics  . Alcohol use: Yes    Alcohol/week: 0.0 standard drinks    Comment: cocktail once a week   Marital Status: Widowed  ROS  Review of Systems  Cardiovascular: Positive for dyspnea on exertion (Chronic, stable). Negative for chest pain, leg swelling, orthopnea, palpitations, paroxysmal nocturnal dyspnea and syncope.  Hematologic/Lymphatic: Does not bruise/bleed easily.  Musculoskeletal: Positive for back pain and joint pain (bilateral knees).  Gastrointestinal: Negative for melena.  Neurological: Negative for dizziness, headaches and light-headedness.   Objective  Blood pressure (!) 147/66, pulse 71, resp. rate 16, height 5\' 3"  (1.6 m), weight 197 lb (89.4 kg), SpO2 93 %.  Vitals with BMI 01/16/2020 01/02/2020 12/25/2019  Height 5\' 3"  5' 3.5" -  Weight 197 lbs 201 lbs -  BMI 37.62 83.15 -  Systolic 176 160 737  Diastolic 66 61 61  Pulse 71 55 76     Physical Exam Vitals reviewed.  Constitutional:      General: She is not in acute distress.    Appearance: She is well-developed.  Cardiovascular:     Rate and Rhythm: Normal rate and regular rhythm.     Pulses: Intact distal pulses.          Carotid pulses are 2+ on the right side and 2+ on the left side.      Radial pulses are 2+ on the right side and 2+ on the left side.       Posterior tibial pulses are 1+ on the right side and 1+ on the left side.     Heart sounds: S1 normal and S2 normal. No murmur heard.  Harsh early systolic murmur is present at the upper right sternal border radiating to the apex.  No gallop.      Comments: Trace bilateral ankle edema. No JVD.  Pulmonary:     Effort: Pulmonary effort is normal. No accessory muscle usage  or respiratory distress.     Breath sounds: Normal breath sounds.  Neurological:     Mental Status: She is alert.    Laboratory examination:   Recent Labs    12/23/19 0350 12/24/19 0627 12/25/19 0339  NA 138 134* 134*  K 4.7 4.2 3.9  CL 100 97* 97*  CO2 31 27 28   GLUCOSE 89 109* 100*  BUN 17 19 18   CREATININE 0.96 0.89 0.87  CALCIUM 8.3* 8.4* 8.4*  GFRNONAA 54* 60* >60  GFRAA >60 >60 >60   CrCl cannot be calculated (Patient's most recent lab result is older than the maximum 21 days allowed.).  CMP Latest Ref Rng & Units 12/25/2019 12/24/2019 12/23/2019  Glucose 70 - 99 mg/dL 100(H) 109(H) 89  BUN  8 - 23 mg/dL 18 19 17   Creatinine 0.44 - 1.00 mg/dL 0.87 0.89 0.96  Sodium 135 - 145 mmol/L 134(L) 134(L) 138  Potassium 3.5 - 5.1 mmol/L 3.9 4.2 4.7  Chloride 98 - 111 mmol/L 97(L) 97(L) 100  CO2 22 - 32 mmol/L 28 27 31   Calcium 8.9 - 10.3 mg/dL 8.4(L) 8.4(L) 8.3(L)  Total Protein 6.5 - 8.1 g/dL - - 7.1  Total Bilirubin 0.3 - 1.2 mg/dL - - 0.5  Alkaline Phos 38 - 126 U/L - - 70  AST 15 - 41 U/L - - 39  ALT 0 - 44 U/L - - 36   CBC Latest Ref Rng & Units 12/25/2019 12/24/2019 12/23/2019  WBC 4.0 - 10.5 K/uL 8.8 10.5 12.4(H)  Hemoglobin 12.0 - 15.0 g/dL 9.1(L) 9.1(L) 9.9(L)  Hematocrit 36 - 46 % 30.7(L) 30.0(L) 32.5(L)  Platelets 150 - 400 K/uL 309 317 354   Lipid Panel    Component Value Date/Time   CHOL 126 03/14/2019 1121   TRIG 82 03/14/2019 1121   HDL 49 03/14/2019 1121   CHOLHDL 5.1 CALC 12/19/2006 0916   VLDL 26 12/19/2006 0916   LDLCALC 61 03/14/2019 1121   LDLDIRECT 159.2 12/19/2006 0916    HEMOGLOBIN A1C No results found for: HGBA1C, MPG TSH Recent Labs    12/23/19 1424  TSH 0.724    External labs:  01/07/2020: BUN 29, GFR 57, creatinine 0.93, BUN/CR 31, sodium 133, potassium 4.6, chloride 93, BMP otherwise normal  Cholesterol, total 164.000 m 06/21/2019 Triglycerides 93.000 06/21/2019 HDL 46 MG/DL 06/21/2019 LDL 99.000 mg 06/21/2019  Glucose Random  87.000 mg 06/21/2019 BUN 25.000 mg 06/21/2019 Creatinine, Serum 0.800 mg/ 06/21/2019  TSH 3.220 micr 06/21/2019  AST 18, ALT 11 09/14/2018  FOBT: Normal 04/24/2018  MicroAlbumin Urine 13.000 03/23/2017 MicroAlbumin/Creat 37.9 MG/ 03/23/2017  Medications and allergies   Allergies  Allergen Reactions  . Statins Other (See Comments)    Muscle aches and INTERNAL BLEEDING  . Gabapentin Other (See Comments)    "Made me loopy"  . Methocarbamol Other (See Comments)    "Made me loopy"     Current Outpatient Medications  Medication Instructions  . amiodarone (PACERONE) 100 mg, Oral, Daily  . amLODipine (NORVASC) 5 mg, Oral, Daily  . apixaban (ELIQUIS) 5 mg, Oral, 2 times daily  . aspirin 81 mg, Oral, Daily, Swallow whole.  . Bempedoic Acid-Ezetimibe (NEXLIZET) 180-10 MG TABS 1 capsule, Oral, Daily  . carvedilol (COREG) 25 mg, Oral, 2 times daily  . diclofenac sodium (VOLTAREN) 2.25 g, Topical, 3 times daily PRN  . hydrochlorothiazide (MICROZIDE) 12.5 mg, Oral, Daily  . HYDROcodone-acetaminophen (NORCO) 7.5-325 MG tablet 1 tablet, Oral, Every 6 hours PRN  . isosorbide-hydrALAZINE (BIDIL) 20-37.5 MG tablet 1 tablet, Oral, 3 times daily  . latanoprost (XALATAN) 0.005 % ophthalmic solution 1 drop, Both Eyes, Daily at bedtime  . levothyroxine (SYNTHROID) 125 mcg, Oral, Daily before breakfast  . losartan (COZAAR) 100 MG tablet TAKE 1 TABLET(100 MG) BY MOUTH TWICE DAILY  . Multiple Vitamins-Minerals (PRESERVISION AREDS 2+MULTI VIT PO) 1 tablet, Oral, 2 times daily  . rOPINIRole (REQUIP) 1 mg, Oral, See admin instructions, Take 1 mg by mouth once a day between 3 PM-4 PM and an additional 1 mg in the evening, if no relief  . Vitamin D3 5,000 Units, Oral, Daily with breakfast, Taking it at lunchtime   Medications Discontinued During This Encounter  Medication Reason  . ondansetron (ZOFRAN) 4 MG tablet No longer needed (for PRN medications)  .  hydrALAZINE (APRESOLINE) 25 MG tablet Change in  therapy  . isosorbide dinitrate (ISORDIL) 30 MG tablet Change in therapy  . carvedilol (COREG) 12.5 MG tablet Change in therapy  . omeprazole (PRILOSEC) 20 MG capsule No longer needed (for PRN medications)  . ipratropium (ATROVENT) 0.03 % nasal spray No longer needed (for PRN medications)    Radiology:   No results found. CT Abdomen and Pelvis W Contrast 12/20/2019: IMPRESSION: 1. Large Infrarenal Abdominal Aortic Aneurysm measuring up to 5.7 cm with mild perianeurysmal inflammation. Differential considerations include impending AAA rupture, mycotic AAA, symptomatic AAA. Recommend urgent transfer to emergency department and Vascular Surgery consultation. Aortic aneurysm NOS (ICD10-I71.9). Aortic Atherosclerosis (ICD10-I70.0). 2. No other acute or inflammatory process identified. 5 cm fat containing left pelvic hernia. Large bowel diverticulosis. 3. Simple appearing 4 cm left ovarian cyst, recommend follow-up Ultrasound in 6-12 months  Chest X-ray 12/20/2019:  FINDINGS: Diffuse cardiac enlargement. Emphysematous changes in the lungs. Coarse interstitial pattern to the lungs could represent fibrosis or edema. No focal consolidation. No pleural effusions. No pneumothorax. Calcified and tortuous aorta. Degenerative changes in the shoulders and spine. Old rib fractures. Similar appearance to previous study. IMPRESSION: 1. Cardiac enlargement. 2. Coarse interstitial pattern to the lungs could represent fibrosis or edema.  Abdominal X-Ray 12/20/2019:  IMPRESSION: Interval placement of aorto iliac stent graft. Nonobstructive bowel gas pattern.  Cardiac Studies:   Lexiscan myoview stress test 12/08/2017: 1. Lexiscan stress test was performed. Exercise capacity was not assessed. Stress symptoms included abdominal pain. Blood pressure was normal. The resting and stress electrocardiogram demonstrated normal sinus rhythm, normal resting conduction, no resting arrhythmias and normal rest  repolarization. 2. The overall quality of the study is good. There is no evidence of abnormal lung activity. Stress and rest SPECT images demonstrate homogeneous tracer distribution throughout the myocardium. Gated SPECT imaging reveals normal myocardial thickening and wall motion. The left ventricular ejection fraction was normal (54%). 3. Low risk study  Renal artery duplex 09/27/2017: Hemodynamically significant stenosis bilaterally. Renal length is within normal limits for both kidneys. There is an incidental 3.9x3.4x3.4 cm proximal and mid abdominal aortic aneurysm. Recommend dedicated abdominal aortic duplex scan to further define AAA.  Abdominal Aortic Duplex  01/01/2019: Moderate dilatation of the abdominal aorta is noted in the proximal, mid and distal aorta. An abdominal aortic aneurysm measuring 4.5 x 4.52 x 4.52 cm is seen.  Recheck in 6 months for stability of the aneurysm.  Echocardiogram 01/25/2019: Left ventricle cavity is normal in size. Severe concentric hypertrophy of the left ventricle. Normal LV systolic function with visual EF 50-55%. Normal global wall motion. Doppler evidence of grade I (impaired) diastolic dysfunction, normal LAP. Calculated EF 47%. Left atrial cavity is mildly dilated. Trileaflet aortic valve with mild calcification of the aortic valve annulus and leaflets.  Mild aortic valve stenosis. Aortic valve mean gradient of 12 mmHg, Vmax of 2.3  m/s. Calculated aortic valve area by continuity equation is 1.3 cm. Trace aortic regurgitation. Mild (Grade I) mitral regurgitation. Mild tricuspid regurgitation. Estimated pulmonary artery systolic pressure is 25 mmHg. No significant change compared to previous study on 09/27/2017.  TEE 12/24/2019: 1. Left ventricular ejection fraction, by estimation, is 50 to 55%. The left ventricle has low normal function. Left ventricular diastolic function could not be evaluated.  2. Right ventricular systolic function is normal.  The right ventricular size is normal.  3. Left atrial size was moderately dilated. No left atrial/left atrial appendage thrombus was detected. The LAA emptying velocity was 30  cm/s.  4. The mitral valve is normal in structure. Mild mitral valve regurgitation. No evidence of mitral stenosis.  5. The aortic valve is calcified. Aortic valve regurgitation is not visualized. Mild aortic valve stenosis. Aortic valve mean gradient measures 10.8 mmHg.  6. There is Moderate (Grade III) layered plaque involving the ascending, transverse and descending aorta.   EKG EKG 01/02/2020: Sinus bradycardia at a rate of 54 bpm with borderline first-degree AV block.  Normal axis.  Poor R wave progression cannot exclude anterior infarct old. Compared to 01/08/2020, bradycardia new.   01/08/2019: Normal sinus rhythm at 63 bpm with first degree AV block, normal axis, no evidence of ischemia.   Assessment     ICD-10-CM   1. Primary hypertension  I10 isosorbide-hydrALAZINE (BIDIL) 20-37.5 MG tablet    carvedilol (COREG) 25 MG tablet  2. Paroxysmal atrial fibrillation (HCC)  I48.0 carvedilol (COREG) 25 MG tablet  3. Chronic diastolic (congestive) heart failure (HCC)  I50.32 isosorbide-hydrALAZINE (BIDIL) 20-37.5 MG tablet    carvedilol (COREG) 25 MG tablet  4. AAA (abdominal aortic aneurysm) without rupture (HCC)  I71.4    This patients CHA2DS2-VASc Score 6 (CHF, HTN, vasc, age, F) and yearly risk of stroke 9.8%.   Recommendations:   Lisa Mcclure  is a 86 y.o.  female  with past medical history of obesity, chronic diastolic CHF, hypertensive heart disease, HTN, anemia, hemorrhoids, depression, diverticulosis, esophageal reflux, OSA intolerant to CPAP on nocturnal 02, rheumatoid arthritis, and venous insufficiency. CT scan of the chest 03/2016 without definitive ILD. LM and 2 vessel CAD, mild calcification on mitral and aortic valve. S/p AAA repair on 12/20/2019.    Patient is presently doing well with no  clinical signs of heart failure, no angina, and stable chronic shortness of breath.  She is tolerating hydrochlorothiazide 12.5 mg daily and carvedilol 12.5 mg twice daily well without adverse effects.  Reviewed labs renal function remains stable.  Patient's heart rate has improved to approximately 70 bpm today.  She is enrolled in home blood pressure and heart rate monitoring, blood pressure readings have improved, however she continues to have elevated blood pressures as high as 175/96.  She has also had episodes of lower blood pressure 98/52, she was asymptomatic at this time.  In view of chronic diastolic heart failure as well as continued elevated blood pressure will increase carvedilol to 25 mg twice daily.  Will continue amlodipine, hydrochlorothiazide, losartan.  Will switch hydralazine and isosorbide dinitrate to BiDil in order to decrease pill burden for patient.  Of note informed patient to continue to monitor blood pressure and if systolic blood pressure is less than 100 she can hold her next dose of BiDil.  Patient with still like to consider reducing her pill burden further, however at this time we will continue  cardiac medications as discussed above.  Can consider combining amlodipine and ARB in the future.  On exam patient appears to be in regular rhythm, will continue amiodarone 100 mg daily. She will need annual TSH and liver function testing.  She is tolerating Eliquis without bleeding diathesis, will continue as CHA2DS2-VASc score is 6.  In view of patient's coronary artery disease.  Continue aspirin 81 mg daily.  [name] is present and had several questions regarding current medications and dosing schedule.  She asked if patient needs to continue taking omeprazole and Atrovent, discussed that from cardiac standpoint unless there is an indication from her PCP for use I see no reason to continue taking  these.  [name] will discuss stopping these medications with  PCP.  Patient has follow-up scheduled with vascular surgery next month.  Patient also reports she is currently considering laser eye surgery.  Instructed patient to have ophthalmologist fax paperwork for cardiac clearance to our office.  Encouraged patient continue to focus on maintaining heart healthy diet.  Follow-up in 4 weeks for hypertension and heart failure.  This was a 50-minute encounter with face-to-face counseling, medication reconciliation, medical records review, coordination of care, explanation of complex medical issues, complex medical decision making.  Great than 50% of this time was spent addressing patient and patient's daughters questions and concerns.  All questions and concerns were addressed.  Patient expressed verbal understanding.   Alethia Berthold, PA-C 01/16/2020, 4:58 PM Office: [phone]

## 2020-01-16 ENCOUNTER — Encounter: Payer: Self-pay | Admitting: Student

## 2020-01-16 ENCOUNTER — Ambulatory Visit: Payer: Medicare Other | Admitting: Student

## 2020-01-16 ENCOUNTER — Other Ambulatory Visit: Payer: Self-pay

## 2020-01-16 VITALS — BP 147/66 | HR 71 | Resp 16 | Ht 63.0 in | Wt 197.0 lb

## 2020-01-16 DIAGNOSIS — I714 Abdominal aortic aneurysm, without rupture, unspecified: Secondary | ICD-10-CM

## 2020-01-16 DIAGNOSIS — I10 Essential (primary) hypertension: Secondary | ICD-10-CM | POA: Diagnosis not present

## 2020-01-16 DIAGNOSIS — Z48812 Encounter for surgical aftercare following surgery on the circulatory system: Secondary | ICD-10-CM | POA: Diagnosis not present

## 2020-01-16 DIAGNOSIS — I0981 Rheumatic heart failure: Secondary | ICD-10-CM | POA: Diagnosis not present

## 2020-01-16 DIAGNOSIS — I480 Paroxysmal atrial fibrillation: Secondary | ICD-10-CM

## 2020-01-16 DIAGNOSIS — I5032 Chronic diastolic (congestive) heart failure: Secondary | ICD-10-CM | POA: Diagnosis not present

## 2020-01-16 DIAGNOSIS — U071 COVID-19: Secondary | ICD-10-CM | POA: Diagnosis not present

## 2020-01-16 DIAGNOSIS — I4891 Unspecified atrial fibrillation: Secondary | ICD-10-CM | POA: Diagnosis not present

## 2020-01-16 DIAGNOSIS — I110 Hypertensive heart disease with heart failure: Secondary | ICD-10-CM | POA: Diagnosis not present

## 2020-01-16 MED ORDER — BIDIL 20-37.5 MG PO TABS: 1.0000 | ORAL_TABLET | Freq: Three times a day (TID) | ORAL | 3 refills | Status: DC

## 2020-01-16 MED ORDER — CARVEDILOL 25 MG PO TABS: 25.0000 mg | ORAL_TABLET | Freq: Two times a day (BID) | ORAL | 3 refills | Status: DC

## 2020-01-20 ENCOUNTER — Telehealth: Payer: Self-pay

## 2020-01-20 NOTE — Telephone Encounter (Signed)
Patient called in to get sch with dr newton °

## 2020-01-21 DIAGNOSIS — I110 Hypertensive heart disease with heart failure: Secondary | ICD-10-CM | POA: Diagnosis not present

## 2020-01-21 DIAGNOSIS — I4891 Unspecified atrial fibrillation: Secondary | ICD-10-CM | POA: Diagnosis not present

## 2020-01-21 DIAGNOSIS — I0981 Rheumatic heart failure: Secondary | ICD-10-CM | POA: Diagnosis not present

## 2020-01-21 DIAGNOSIS — I5032 Chronic diastolic (congestive) heart failure: Secondary | ICD-10-CM | POA: Diagnosis not present

## 2020-01-21 DIAGNOSIS — Z48812 Encounter for surgical aftercare following surgery on the circulatory system: Secondary | ICD-10-CM | POA: Diagnosis not present

## 2020-01-21 DIAGNOSIS — U071 COVID-19: Secondary | ICD-10-CM | POA: Diagnosis not present

## 2020-01-21 NOTE — Telephone Encounter (Signed)
Patient had right shoulder injection on 8/5. Please advise.

## 2020-01-21 NOTE — Telephone Encounter (Signed)
Patient is requesting an appointment for her low back. She says the pain is "way down" in her low back at her coccyx. She states that the pain radiates to her left hip and she is stiff. Please advise.

## 2020-01-21 NOTE — Telephone Encounter (Signed)
Please Advise

## 2020-01-21 NOTE — Telephone Encounter (Signed)
Ok but really need 3 month spacing or close

## 2020-01-22 NOTE — Telephone Encounter (Signed)
Scheduled for 10/20 and added to wait list.

## 2020-01-22 NOTE — Telephone Encounter (Signed)
30 min appt Ov/either hip injection or coccyx injection ok

## 2020-01-26 DIAGNOSIS — U071 COVID-19: Secondary | ICD-10-CM | POA: Diagnosis not present

## 2020-01-26 DIAGNOSIS — G4733 Obstructive sleep apnea (adult) (pediatric): Secondary | ICD-10-CM | POA: Diagnosis not present

## 2020-01-26 DIAGNOSIS — E871 Hypo-osmolality and hyponatremia: Secondary | ICD-10-CM | POA: Diagnosis not present

## 2020-01-26 DIAGNOSIS — I440 Atrioventricular block, first degree: Secondary | ICD-10-CM | POA: Diagnosis not present

## 2020-01-26 DIAGNOSIS — F410 Panic disorder [episodic paroxysmal anxiety] without agoraphobia: Secondary | ICD-10-CM | POA: Diagnosis not present

## 2020-01-26 DIAGNOSIS — Z48812 Encounter for surgical aftercare following surgery on the circulatory system: Secondary | ICD-10-CM | POA: Diagnosis not present

## 2020-01-26 DIAGNOSIS — K649 Unspecified hemorrhoids: Secondary | ICD-10-CM | POA: Diagnosis not present

## 2020-01-26 DIAGNOSIS — I5032 Chronic diastolic (congestive) heart failure: Secondary | ICD-10-CM | POA: Diagnosis not present

## 2020-01-26 DIAGNOSIS — G4700 Insomnia, unspecified: Secondary | ICD-10-CM | POA: Diagnosis not present

## 2020-01-26 DIAGNOSIS — M545 Low back pain, unspecified: Secondary | ICD-10-CM | POA: Diagnosis not present

## 2020-01-26 DIAGNOSIS — H409 Unspecified glaucoma: Secondary | ICD-10-CM | POA: Diagnosis not present

## 2020-01-26 DIAGNOSIS — M069 Rheumatoid arthritis, unspecified: Secondary | ICD-10-CM | POA: Diagnosis not present

## 2020-01-26 DIAGNOSIS — I872 Venous insufficiency (chronic) (peripheral): Secondary | ICD-10-CM | POA: Diagnosis not present

## 2020-01-26 DIAGNOSIS — K5730 Diverticulosis of large intestine without perforation or abscess without bleeding: Secondary | ICD-10-CM | POA: Diagnosis not present

## 2020-01-26 DIAGNOSIS — J449 Chronic obstructive pulmonary disease, unspecified: Secondary | ICD-10-CM | POA: Diagnosis not present

## 2020-01-26 DIAGNOSIS — F329 Major depressive disorder, single episode, unspecified: Secondary | ICD-10-CM | POA: Diagnosis not present

## 2020-01-26 DIAGNOSIS — I110 Hypertensive heart disease with heart failure: Secondary | ICD-10-CM | POA: Diagnosis not present

## 2020-01-26 DIAGNOSIS — M810 Age-related osteoporosis without current pathological fracture: Secondary | ICD-10-CM | POA: Diagnosis not present

## 2020-01-26 DIAGNOSIS — E039 Hypothyroidism, unspecified: Secondary | ICD-10-CM | POA: Diagnosis not present

## 2020-01-26 DIAGNOSIS — I083 Combined rheumatic disorders of mitral, aortic and tricuspid valves: Secondary | ICD-10-CM | POA: Diagnosis not present

## 2020-01-26 DIAGNOSIS — I4891 Unspecified atrial fibrillation: Secondary | ICD-10-CM | POA: Diagnosis not present

## 2020-01-26 DIAGNOSIS — I0981 Rheumatic heart failure: Secondary | ICD-10-CM | POA: Diagnosis not present

## 2020-01-26 DIAGNOSIS — I2510 Atherosclerotic heart disease of native coronary artery without angina pectoris: Secondary | ICD-10-CM | POA: Diagnosis not present

## 2020-01-26 DIAGNOSIS — I700 Atherosclerosis of aorta: Secondary | ICD-10-CM | POA: Diagnosis not present

## 2020-01-26 DIAGNOSIS — N3281 Overactive bladder: Secondary | ICD-10-CM | POA: Diagnosis not present

## 2020-01-27 ENCOUNTER — Telehealth: Payer: Self-pay | Admitting: Physical Medicine and Rehabilitation

## 2020-01-27 NOTE — Telephone Encounter (Signed)
Called pt and lvm #1 

## 2020-01-27 NOTE — Telephone Encounter (Signed)
Patient called needing to reschedule her appointment. The number to contact patient is [phone]

## 2020-01-28 ENCOUNTER — Telehealth: Payer: Self-pay | Admitting: Physical Medicine and Rehabilitation

## 2020-01-28 DIAGNOSIS — H26492 Other secondary cataract, left eye: Secondary | ICD-10-CM | POA: Diagnosis not present

## 2020-01-28 NOTE — Telephone Encounter (Signed)
Called pt and lvm #2 

## 2020-01-28 NOTE — Telephone Encounter (Signed)
Patient returning call to set appt. Please call patient at [phone].

## 2020-01-28 NOTE — Telephone Encounter (Signed)
Pt called stating she would like to keep her appt on 01/29/20 @ 9:30

## 2020-01-29 ENCOUNTER — Encounter: Payer: Self-pay | Admitting: Physical Medicine and Rehabilitation

## 2020-01-29 ENCOUNTER — Other Ambulatory Visit: Payer: Self-pay

## 2020-01-29 ENCOUNTER — Ambulatory Visit (INDEPENDENT_AMBULATORY_CARE_PROVIDER_SITE_OTHER): Payer: Medicare Other | Admitting: Physical Medicine and Rehabilitation

## 2020-01-29 ENCOUNTER — Ambulatory Visit: Payer: Self-pay

## 2020-01-29 VITALS — BP 105/73 | HR 58

## 2020-01-29 DIAGNOSIS — M47816 Spondylosis without myelopathy or radiculopathy, lumbar region: Secondary | ICD-10-CM

## 2020-01-29 DIAGNOSIS — I5032 Chronic diastolic (congestive) heart failure: Secondary | ICD-10-CM | POA: Diagnosis not present

## 2020-01-29 DIAGNOSIS — I110 Hypertensive heart disease with heart failure: Secondary | ICD-10-CM | POA: Diagnosis not present

## 2020-01-29 DIAGNOSIS — U071 COVID-19: Secondary | ICD-10-CM | POA: Diagnosis not present

## 2020-01-29 DIAGNOSIS — M25552 Pain in left hip: Secondary | ICD-10-CM

## 2020-01-29 DIAGNOSIS — M5416 Radiculopathy, lumbar region: Secondary | ICD-10-CM

## 2020-01-29 DIAGNOSIS — Z48812 Encounter for surgical aftercare following surgery on the circulatory system: Secondary | ICD-10-CM | POA: Diagnosis not present

## 2020-01-29 DIAGNOSIS — I0981 Rheumatic heart failure: Secondary | ICD-10-CM | POA: Diagnosis not present

## 2020-01-29 DIAGNOSIS — I4891 Unspecified atrial fibrillation: Secondary | ICD-10-CM | POA: Diagnosis not present

## 2020-01-29 DIAGNOSIS — M4317 Spondylolisthesis, lumbosacral region: Secondary | ICD-10-CM

## 2020-01-29 MED ORDER — BETAMETHASONE SOD PHOS & ACET 6 (3-3) MG/ML IJ SUSP
12.0000 mg | Freq: Once | INTRAMUSCULAR | Status: AC
  Administered 2020-01-29: 12 mg

## 2020-01-29 NOTE — Progress Notes (Signed)
Low back pain radiating into left buttock.  Numeric Pain Rating Scale and Functional Assessment Average Pain 5 Pain Right Now 4 My pain is constant, dull and aching Pain is worse with: sitting and lying in bed Pain improves with: walking   In the last MONTH (on 0-10 scale) has pain interfered with the following?  1. General activity like being  able to carry out your everyday physical activities such as walking, climbing stairs, carrying groceries, or moving a chair?  Rating(2)  2. Relation with others like being able to carry out your usual social activities and roles such as  activities at home, at work and in your community. Rating(2)  3. Enjoyment of life such that you have  been bothered by emotional problems such as feeling anxious, depressed or irritable?  Rating(3)

## 2020-01-30 DIAGNOSIS — I0981 Rheumatic heart failure: Secondary | ICD-10-CM | POA: Diagnosis not present

## 2020-01-30 DIAGNOSIS — I5032 Chronic diastolic (congestive) heart failure: Secondary | ICD-10-CM | POA: Diagnosis not present

## 2020-01-30 DIAGNOSIS — I4891 Unspecified atrial fibrillation: Secondary | ICD-10-CM | POA: Diagnosis not present

## 2020-01-30 DIAGNOSIS — Z48812 Encounter for surgical aftercare following surgery on the circulatory system: Secondary | ICD-10-CM | POA: Diagnosis not present

## 2020-01-30 DIAGNOSIS — U071 COVID-19: Secondary | ICD-10-CM | POA: Diagnosis not present

## 2020-01-30 DIAGNOSIS — I110 Hypertensive heart disease with heart failure: Secondary | ICD-10-CM | POA: Diagnosis not present

## 2020-02-05 DIAGNOSIS — U071 COVID-19: Secondary | ICD-10-CM | POA: Diagnosis not present

## 2020-02-05 DIAGNOSIS — I0981 Rheumatic heart failure: Secondary | ICD-10-CM | POA: Diagnosis not present

## 2020-02-05 DIAGNOSIS — I4891 Unspecified atrial fibrillation: Secondary | ICD-10-CM | POA: Diagnosis not present

## 2020-02-05 DIAGNOSIS — I110 Hypertensive heart disease with heart failure: Secondary | ICD-10-CM | POA: Diagnosis not present

## 2020-02-05 DIAGNOSIS — I5032 Chronic diastolic (congestive) heart failure: Secondary | ICD-10-CM | POA: Diagnosis not present

## 2020-02-05 DIAGNOSIS — Z48812 Encounter for surgical aftercare following surgery on the circulatory system: Secondary | ICD-10-CM | POA: Diagnosis not present

## 2020-02-08 DIAGNOSIS — Z23 Encounter for immunization: Secondary | ICD-10-CM | POA: Diagnosis not present

## 2020-02-09 DIAGNOSIS — I10 Essential (primary) hypertension: Secondary | ICD-10-CM | POA: Diagnosis not present

## 2020-02-10 ENCOUNTER — Telehealth: Payer: Self-pay

## 2020-02-10 DIAGNOSIS — Z48812 Encounter for surgical aftercare following surgery on the circulatory system: Secondary | ICD-10-CM | POA: Diagnosis not present

## 2020-02-10 DIAGNOSIS — I0981 Rheumatic heart failure: Secondary | ICD-10-CM | POA: Diagnosis not present

## 2020-02-10 DIAGNOSIS — I4891 Unspecified atrial fibrillation: Secondary | ICD-10-CM | POA: Diagnosis not present

## 2020-02-10 DIAGNOSIS — I110 Hypertensive heart disease with heart failure: Secondary | ICD-10-CM | POA: Diagnosis not present

## 2020-02-10 DIAGNOSIS — U071 COVID-19: Secondary | ICD-10-CM | POA: Diagnosis not present

## 2020-02-10 DIAGNOSIS — I5032 Chronic diastolic (congestive) heart failure: Secondary | ICD-10-CM | POA: Diagnosis not present

## 2020-02-10 NOTE — Progress Notes (Signed)
Primary Physician/Referring:  [name], MD  [name], female    DOB: 11-Jan-1933, 86 y.o.   MRN: [id number]  Chief Complaint  Patient presents with  . Hypertension  . Congestive Heart Failure  . Follow-up    4 week   HPI:    Lisa Mcclure  is a 86 y.o.  female  with past medical history of obesity, paroxysmal atrial fibrillation, chronic diastolic CHF, hypertensive heart disease, HTN, anemia, hemorrhoids, depression, diverticulosis, esophageal reflux, OSA intolerant to CPAP on nocturnal 02, rheumatoid arthritis, and venous insufficiency. CT scan of the chest 03/2016 without definitive ILD. LM and 2 vessel CAD, mild calcification on mitral and aortic valve. S/p AAA repair on 12/20/2019.   In the past patient has been on higher doses of carvedilol, 25 mg twice daily, this was reduced due to complaints of fatigue and dizziness.  She has also previously been on amlodipine 10 mg daily, however patient stopped this for unknown reason.  Hypertension was previously well controlled with carvedilol, hydralazine, isosorbide dinitrate, losartan, furosemide.  Patient now presents for 4-week follow-up of hypertension.  At previous visit increased carvedilol to 25 mg twice daily and combined hydralazine and isosorbide dinitrate to BiDil in 1 week decrease pill burden.  Patient states today that since increasing carvedilol she has experienced increased fatigue, denies dizziness, syncope, near-syncope.  Home blood pressure readings are still elevated above goal and heart rate typically 60-70, she is enrolled in remote patient monitoring.  She also reports that since stopping her omeprazole following her last visit she has had increased symptoms of reflux.   Past Medical History:  Diagnosis Date  . Anemia    h/o hemorrhoidal bleeding and blood transfusion  . Cardiomegaly   . Chronic diastolic CHF (congestive heart failure) ([place])   . Depression   . Diverticulosis   . DOE  (dyspnea on exertion)   . Esophageal reflux   . GERD (gastroesophageal reflux disease)   . Hypothyroidism   . Insomnia   . LBP (low back pain)   . OAB (overactive bladder)   . Obesity   . OSA (obstructive sleep apnea)   . Osteoarthritis   . Rheumatoid arthritis(714.0)   . Shoulder pain, bilateral   . Unspecified essential hypertension   . Venous insufficiency    Past Surgical History:  Procedure Laterality Date  . ABDOMINAL AORTIC ENDOVASCULAR STENT GRAFT N/A 12/20/2019   Procedure: Aortogram including catheter selection of aorta and bilateral iliac arteriogram, Endovascular repair of infrarenal abdominal aortic aneurysm with bifurcated stent graft (26 mm x 14 x 12 main body, right bell bottom with a 20 mm x 10 cm piece, and left bell bottom with a 16 mm x 12 cm piece) ;  Surgeon: Marty Heck, MD;  Location: [facility];  Service: Vascular;  Laterality: N/A;  . APPENDECTOMY  1953  . bladder abduction-1996  1996  . breast biopsy Right 1980  . CARDIOVERSION N/A 12/24/2019   Procedure: CARDIOVERSION;  Surgeon: Adrian Prows, MD;  Location: [facility] ENDOSCOPY;  Service: Cardiovascular;  Laterality: N/A;  . [place]  . COSMETIC SURGERY  1996  . CYSTOCELE REPAIR    . FLEXIBLE SIGMOIDOSCOPY N/A 04/10/2015   Procedure: FLEXIBLE SIGMOIDOSCOPY;  Surgeon: Arta Silence, MD;  Location: [facility] ENDOSCOPY;  Service: Endoscopy;  Laterality: N/A;  . knee arthroscopy Right 1996, 2010  . KNEE ARTHROSCOPY W/ AUTOGENOUS CARTILAGE IMPLANTATION (ACI) PROCEDURE Left 1994, 1995  . SHOULDER SURGERY  1990  . TEE WITHOUT CARDIOVERSION N/A 12/24/2019   Procedure: TRANSESOPHAGEAL ECHOCARDIOGRAM (TEE);  Surgeon: Adrian Prows, MD;  Location: [facility];  Service: Cardiovascular;  Laterality: N/A;  . TOTAL ABDOMINAL HYSTERECTOMY  1972  . ULTRASOUND GUIDANCE FOR VASCULAR ACCESS Bilateral 12/20/2019   Procedure: Ultrasound-guided access of bilateral common femoral arteries for delivery of endograft  and percutaneous closure;  Surgeon: Marty Heck, MD;  Location: [facility] OR;  Service: Vascular;  Laterality: Bilateral;  . VESICOVAGINAL FISTULA CLOSURE W/ TAH    . WRIST SURGERY  1967   Family History  Problem Relation Age of Onset  . COPD Father   . Lung cancer Mother   . Heart attack Maternal Grandmother 80  . Breast cancer Maternal Aunt   . Lung cancer Son     Social History   Tobacco Use  . Smoking status: Former Smoker    Packs/day: 0.20    Years: 10.00    Pack years: 2.00    Types: Cigarettes    Quit date: 04/11/1984    Years since quitting: 35.8  . Smokeless tobacco: Never Used  Substance Use Topics  . Alcohol use: Yes    Alcohol/week: 0.0 standard drinks    Comment: cocktail once a week   Marital Status: Widowed  ROS  Review of Systems  Constitutional: Positive for malaise/fatigue.  Cardiovascular: Positive for dyspnea on exertion (Chronic, stable). Negative for chest pain, leg swelling, orthopnea, palpitations, paroxysmal nocturnal dyspnea and syncope.  Hematologic/Lymphatic: Does not bruise/bleed easily.  Musculoskeletal: Positive for back pain and joint pain (bilateral knees).  Gastrointestinal: Negative for melena.  Neurological: Positive for dizziness. Negative for headaches and light-headedness.   Objective  Blood pressure (!) 115/43, pulse 63, resp. rate 16, height 5\' 3"  (1.6 m), weight 195 lb (88.5 kg), SpO2 94 %.  Vitals with BMI 02/11/2020 01/29/2020 01/16/2020  Height 5\' 3"  - 5\' 3"   Weight 195 lbs - 197 lbs  BMI 32.99 - 24.26  Systolic 834 196 222  Diastolic 43 73 66  Pulse 63 58 71     Physical Exam Vitals reviewed.  Constitutional:      General: She is not in acute distress.    Appearance: She is well-developed.  Cardiovascular:     Rate and Rhythm: Normal rate and regular rhythm.     Pulses: Intact distal pulses.          Carotid pulses are 2+ on the right side and 2+ on the left side.      Radial pulses are 2+ on the right side and 2+  on the left side.     Heart sounds: S1 normal and S2 normal. No murmur heard.  Harsh early systolic murmur is present at the upper right sternal border radiating to the apex.  No gallop.      Comments: Trace bilateral ankle edema. No JVD.  Pulmonary:     Effort: Pulmonary effort is normal. No accessory muscle usage or respiratory distress.     Breath sounds: Normal breath sounds.  Neurological:     Mental Status: She is alert.    Laboratory examination:   Recent Labs    12/23/19 0350 12/24/19 0627 12/25/19 0339  NA 138 134* 134*  K 4.7 4.2 3.9  CL 100 97* 97*  CO2 31 27 28   GLUCOSE 89 109* 100*  BUN 17 19 18   CREATININE 0.96 0.89 0.87  CALCIUM 8.3* 8.4* 8.4*  GFRNONAA 54* 60* >60  GFRAA >60 >60 >60   CrCl  cannot be calculated (Patient's most recent lab result is older than the maximum 21 days allowed.).  CMP Latest Ref Rng & Units 12/25/2019 12/24/2019 12/23/2019  Glucose 70 - 99 mg/dL 100(H) 109(H) 89  BUN 8 - 23 mg/dL 18 19 17   Creatinine 0.44 - 1.00 mg/dL 0.87 0.89 0.96  Sodium 135 - 145 mmol/L 134(L) 134(L) 138  Potassium 3.5 - 5.1 mmol/L 3.9 4.2 4.7  Chloride 98 - 111 mmol/L 97(L) 97(L) 100  CO2 22 - 32 mmol/L 28 27 31   Calcium 8.9 - 10.3 mg/dL 8.4(L) 8.4(L) 8.3(L)  Total Protein 6.5 - 8.1 g/dL - - 7.1  Total Bilirubin 0.3 - 1.2 mg/dL - - 0.5  Alkaline Phos 38 - 126 U/L - - 70  AST 15 - 41 U/L - - 39  ALT 0 - 44 U/L - - 36   CBC Latest Ref Rng & Units 12/25/2019 12/24/2019 12/23/2019  WBC 4.0 - 10.5 K/uL 8.8 10.5 12.4(H)  Hemoglobin 12.0 - 15.0 g/dL 9.1(L) 9.1(L) 9.9(L)  Hematocrit 36 - 46 % 30.7(L) 30.0(L) 32.5(L)  Platelets 150 - 400 K/uL 309 317 354   Lipid Panel    Component Value Date/Time   CHOL 126 03/14/2019 1121   TRIG 82 03/14/2019 1121   HDL 49 03/14/2019 1121   CHOLHDL 5.1 CALC 12/19/2006 0916   VLDL 26 12/19/2006 0916   LDLCALC 61 03/14/2019 1121   LDLDIRECT 159.2 12/19/2006 0916    HEMOGLOBIN A1C No results found for: HGBA1C,  MPG TSH Recent Labs    12/23/19 1424  TSH 0.724    External labs:  01/07/2020: BUN 29, GFR 57, creatinine 0.93, BUN/CR 31, sodium 133, potassium 4.6, chloride 93, BMP otherwise normal  Cholesterol, total 164.000 m 06/21/2019 Triglycerides 93.000 06/21/2019 HDL 46 MG/DL 06/21/2019 LDL 99.000 mg 06/21/2019  Glucose Random 87.000 mg 06/21/2019 BUN 25.000 mg 06/21/2019 Creatinine, Serum 0.800 mg/ 06/21/2019  TSH 3.220 micr 06/21/2019  AST 18, ALT 11 09/14/2018  FOBT: Normal 04/24/2018  MicroAlbumin Urine 13.000 03/23/2017 MicroAlbumin/Creat 37.9 MG/ 03/23/2017  Medications and allergies   Allergies  Allergen Reactions  . Statins Other (See Comments)    Muscle aches and INTERNAL BLEEDING  . Gabapentin Other (See Comments)    "Made me loopy"  . Methocarbamol Other (See Comments)    "Made me loopy"     Current Outpatient Medications  Medication Instructions  . amiodarone (PACERONE) 100 mg, Oral, Daily  . amLODipine (NORVASC) 10 mg, Oral, Daily  . apixaban (ELIQUIS) 5 mg, Oral, 2 times daily  . aspirin 81 mg, Oral, Daily, Swallow whole.  . Bempedoic Acid-Ezetimibe (NEXLIZET) 180-10 MG TABS 1 capsule, Oral, Daily  . busPIRone (BUSPAR) 7.5 mg, Oral, As needed  . carvedilol (COREG) 12.5 mg, Oral, 2 times daily  . diclofenac sodium (VOLTAREN) 2.25 g, Topical, 3 times daily PRN  . hydrochlorothiazide (MICROZIDE) 12.5 mg, Oral, Daily  . HYDROcodone-acetaminophen (NORCO) 7.5-325 MG tablet 1 tablet, Oral, Every 6 hours PRN  . isosorbide-hydrALAZINE (BIDIL) 20-37.5 MG tablet 1 tablet, Oral, 3 times daily  . latanoprost (XALATAN) 0.005 % ophthalmic solution 1 drop, Both Eyes, Daily at bedtime  . levothyroxine (SYNTHROID) 125 mcg, Oral, Daily before breakfast  . losartan (COZAAR) 100 MG tablet TAKE 1 TABLET(100 MG) BY MOUTH TWICE DAILY  . Multiple Vitamins-Minerals (PRESERVISION AREDS 2+MULTI VIT PO) 1 tablet, Oral, 2 times daily  . omeprazole (PRILOSEC) 20 mg, Oral, Daily  .  rOPINIRole (REQUIP) 1 mg, Oral, See admin instructions, Take 1 mg by mouth once  a day between 3 PM-4 PM and an additional 1 mg in the evening, if no relief  . Vitamin D3 5,000 Units, Oral, Daily with breakfast, Taking it at lunchtime   Medications Discontinued During This Encounter  Medication Reason  . amLODipine (NORVASC) 5 MG tablet   . carvedilol (COREG) 25 MG tablet     Radiology:   No results found.  CT Abdomen and Pelvis W Contrast 12/20/2019: IMPRESSION: 1. Large Infrarenal Abdominal Aortic Aneurysm measuring up to 5.7 cm with mild perianeurysmal inflammation. Differential considerations include impending AAA rupture, mycotic AAA, symptomatic AAA. Recommend urgent transfer to emergency department and Vascular Surgery consultation. Aortic aneurysm NOS (ICD10-I71.9). Aortic Atherosclerosis (ICD10-I70.0). 2. No other acute or inflammatory process identified. 5 cm fat containing left pelvic hernia. Large bowel diverticulosis. 3. Simple appearing 4 cm left ovarian cyst, recommend follow-up Ultrasound in 6-12 months  Chest X-ray 12/20/2019:  FINDINGS: Diffuse cardiac enlargement. Emphysematous changes in the lungs. Coarse interstitial pattern to the lungs could represent fibrosis or edema. No focal consolidation. No pleural effusions. No pneumothorax. Calcified and tortuous aorta. Degenerative changes in the shoulders and spine. Old rib fractures. Similar appearance to previous study. IMPRESSION: 1. Cardiac enlargement. 2. Coarse interstitial pattern to the lungs could represent fibrosis or edema.  Abdominal X-Ray 12/20/2019:  IMPRESSION: Interval placement of aorto iliac stent graft. Nonobstructive bowel gas pattern.  Cardiac Studies:   Lexiscan myoview stress test 12/08/2017: 1. Lexiscan stress test was performed. Exercise capacity was not assessed. Stress symptoms included abdominal pain. Blood pressure was normal. The resting and stress electrocardiogram demonstrated  normal sinus rhythm, normal resting conduction, no resting arrhythmias and normal rest repolarization. 2. The overall quality of the study is good. There is no evidence of abnormal lung activity. Stress and rest SPECT images demonstrate homogeneous tracer distribution throughout the myocardium. Gated SPECT imaging reveals normal myocardial thickening and wall motion. The left ventricular ejection fraction was normal (54%). 3. Low risk study  Renal artery duplex 09/27/2017: Hemodynamically significant stenosis bilaterally. Renal length is within normal limits for both kidneys. There is an incidental 3.9x3.4x3.4 cm proximal and mid abdominal aortic aneurysm. Recommend dedicated abdominal aortic duplex scan to further define AAA.  Abdominal Aortic Duplex  01/01/2019: Moderate dilatation of the abdominal aorta is noted in the proximal, mid and distal aorta. An abdominal aortic aneurysm measuring 4.5 x 4.52 x 4.52 cm is seen.  Recheck in 6 months for stability of the aneurysm.  Echocardiogram 01/25/2019: Left ventricle cavity is normal in size. Severe concentric hypertrophy of the left ventricle. Normal LV systolic function with visual EF 50-55%. Normal global wall motion. Doppler evidence of grade I (impaired) diastolic dysfunction, normal LAP. Calculated EF 47%. Left atrial cavity is mildly dilated. Trileaflet aortic valve with mild calcification of the aortic valve annulus and leaflets.  Mild aortic valve stenosis. Aortic valve mean gradient of 12 mmHg, Vmax of 2.3  m/s. Calculated aortic valve area by continuity equation is 1.3 cm. Trace aortic regurgitation. Mild (Grade I) mitral regurgitation. Mild tricuspid regurgitation. Estimated pulmonary artery systolic pressure is 25 mmHg. No significant change compared to previous study on 09/27/2017.  TEE 12/24/2019: 1. Left ventricular ejection fraction, by estimation, is 50 to 55%. The left ventricle has low normal function. Left ventricular  diastolic function could not be evaluated.  2. Right ventricular systolic function is normal. The right ventricular size is normal.  3. Left atrial size was moderately dilated. No left atrial/left atrial appendage thrombus was detected. The LAA emptying velocity was  30 cm/s.  4. The mitral valve is normal in structure. Mild mitral valve regurgitation. No evidence of mitral stenosis.  5. The aortic valve is calcified. Aortic valve regurgitation is not visualized. Mild aortic valve stenosis. Aortic valve mean gradient measures 10.8 mmHg.  6. There is Moderate (Grade III) layered plaque involving the ascending, transverse and descending aorta.   EKG   EKG 01/02/2020: Sinus bradycardia at a rate of 54 bpm with borderline first-degree AV block.  Normal axis.  Poor R wave progression cannot exclude anterior infarct old. Compared to 01/08/2020, bradycardia new.   EKG 01/08/2019: Normal sinus rhythm at 63 bpm with first degree AV block, normal axis, no evidence of ischemia.   Assessment     ICD-10-CM   1. Primary hypertension  I10 carvedilol (COREG) 25 MG tablet    amLODipine (NORVASC) 5 MG tablet  2. Chronic diastolic (congestive) heart failure (HCC)  I50.32 CBC    carvedilol (COREG) 25 MG tablet  3. Paroxysmal atrial fibrillation ([facility])  I48.0 carvedilol (COREG) 25 MG tablet  4. Other fatigue  R53.83 CBC   This patients CHA2DS2-VASc Score 6 (CHF, HTN, vasc, age, F) and yearly risk of stroke 9.8%.   Recommendations:   Lisa Mcclure  is a 86 y.o.  female  with past medical history of obesity, chronic diastolic CHF, hypertensive heart disease, HTN, anemia, hemorrhoids, depression, diverticulosis, esophageal reflux, OSA intolerant to CPAP on nocturnal 02, rheumatoid arthritis, and venous insufficiency. CT scan of the chest 03/2016 without definitive ILD. LM and 2 vessel CAD, mild calcification on mitral and aortic valve. S/p AAA repair on 12/20/2019.    Patient presents today for 4-week  follow-up of hypertension.  Presently there are no clinical signs of heart failure.  Since increasing carvedilol to 25 mg twice daily at last visit patient has been experiencing increased fatigue and episodes of heart rate in the 50s.  Will reduce carvedilol from 25 mg to 12.5 mg twice daily.  However blood pressure remains above goal, will increase amlodipine from 5 to 10 mg daily.  We will continue current doses of hydrochlorothiazide, BiDil, and losartan. Will continue remote patient monitoring.   Patient symptoms of fatigue may also be related to anemia as previous hemoglobin on December 25, 2019 was 9.1.  Will recheck CBC at this time.  In regard to patient's symptoms of gastric reflux recommend she restart omeprazole 20 mg once daily and follow-up with her primary care provider regarding this. Patient would still like to consider reducing her pill burden further, however at this time we will continue  cardiac medications as discussed above.  Can consider combining amlodipine and ARB in the future. Her hypertension has been difficult to control, likely her obesity is contributing to this.   Of note patient continues to tolerate Eliquis and aspirin without bleeding diathesis, will continue this.  Will also continue amiodarone 100 mg daily, she will need annual TSH and liver testing.  Again discussed with patient regarding weight loss and lifestyle modifications.     Alethia Berthold, PA-C 02/11/2020, 2:42 PM Office: [phone]

## 2020-02-10 NOTE — Telephone Encounter (Signed)
Beth, Nurse from [facility], called stating that patient had been complaining about severe nausea. She has not been taking her Omeprazole. Patient is pointing to her upper gastric area. Also, she stated that the patient is severely fatigued since her the change to BIDIL. Patient is being seen tomorrow and Beth (nurse) wanted you to be aware, since she doesn't think the pain, that the patient is having, is heart related, as patient thought. Just FYI.

## 2020-02-11 ENCOUNTER — Other Ambulatory Visit: Payer: Self-pay

## 2020-02-11 ENCOUNTER — Ambulatory Visit: Payer: Medicare Other | Admitting: Student

## 2020-02-11 ENCOUNTER — Encounter: Payer: Self-pay | Admitting: Student

## 2020-02-11 VITALS — BP 115/43 | HR 63 | Resp 16 | Ht 63.0 in | Wt 195.0 lb

## 2020-02-11 DIAGNOSIS — I5032 Chronic diastolic (congestive) heart failure: Secondary | ICD-10-CM

## 2020-02-11 DIAGNOSIS — I480 Paroxysmal atrial fibrillation: Secondary | ICD-10-CM | POA: Diagnosis not present

## 2020-02-11 DIAGNOSIS — R5383 Other fatigue: Secondary | ICD-10-CM | POA: Diagnosis not present

## 2020-02-11 DIAGNOSIS — I10 Essential (primary) hypertension: Secondary | ICD-10-CM

## 2020-02-11 MED ORDER — OMEPRAZOLE 20 MG PO CPDR: 20.0000 mg | DELAYED_RELEASE_CAPSULE | Freq: Every day | ORAL | 11 refills | Status: DC

## 2020-02-11 MED ORDER — AMLODIPINE BESYLATE 10 MG PO TABS: 10.0000 mg | ORAL_TABLET | Freq: Every day | ORAL | 1 refills | Status: DC

## 2020-02-11 MED ORDER — AMLODIPINE BESYLATE 5 MG PO TABS: 10.0000 mg | ORAL_TABLET | Freq: Every day | ORAL | 0 refills | Status: DC

## 2020-02-11 MED ORDER — CARVEDILOL 25 MG PO TABS: 12.5000 mg | ORAL_TABLET | Freq: Two times a day (BID) | ORAL | 3 refills | Status: DC

## 2020-02-13 ENCOUNTER — Ambulatory Visit: Payer: Medicare Other | Admitting: Student

## 2020-02-17 ENCOUNTER — Encounter: Payer: Self-pay | Admitting: Physical Medicine and Rehabilitation

## 2020-02-17 DIAGNOSIS — I10 Essential (primary) hypertension: Secondary | ICD-10-CM | POA: Diagnosis not present

## 2020-02-17 DIAGNOSIS — I5032 Chronic diastolic (congestive) heart failure: Secondary | ICD-10-CM | POA: Diagnosis not present

## 2020-02-17 DIAGNOSIS — R5383 Other fatigue: Secondary | ICD-10-CM | POA: Diagnosis not present

## 2020-02-17 NOTE — Procedures (Signed)
S1 Lumbosacral Transforaminal Epidural Steroid Injection - Sub-Pedicular Approach with Fluoroscopic Guidance   Patient: Lisa Mcclure      Date of Birth: 1932-08-25 MRN: [id number] PCP: [name], MD      Visit Date: 01/29/2020   Universal Protocol:    Date/Time: 11/08/215:39 AM  Consent Given By: the patient  Position:  PRONE  Additional Comments: Vital signs were monitored before and after the procedure. Patient was prepped and draped in the usual sterile fashion. The correct patient, procedure, and site was verified.   Injection Procedure Details:  Procedure Site One Meds Administered:  Meds ordered this encounter  Medications  . betamethasone acetate-betamethasone sodium phosphate (CELESTONE) injection 12 mg    Laterality: Left  Location/Site:  S1 Foramen   Needle size: 22 ga.  Needle type: Spinal  Needle Placement: Transforaminal  Findings:   -Comments: Excellent flow of contrast along the nerve and into the epidural space.  Epidurogram: Contrast epidurogram showed no nerve root cut off or restricted flow pattern.  Procedure Details: After squaring off the sacral end-plate to get a true AP view, the C-arm was positioned so that the best possible view of the S1 foramen was visualized. The soft tissues overlying this structure were infiltrated with 2-3 ml. of 1% Lidocaine without Epinephrine.    The spinal needle was inserted toward the target using a "trajectory" view along the fluoroscope beam.  Under AP and lateral visualization, the needle was advanced so it did not puncture dura. Biplanar projections were used to confirm position. Aspiration was confirmed to be negative for CSF and/or blood. A 1-2 ml. volume of Isovue-250 was injected and flow of contrast was noted at each level. Radiographs were obtained for documentation purposes.   After attaining the desired flow of contrast documented above, a 0.5 to 1.0 ml test dose of 0.25% Marcaine was  injected into each respective transforaminal space.  The patient was observed for 90 seconds post injection.  After no sensory deficits were reported, and normal lower extremity motor function was noted,   the above injectate was administered so that equal amounts of the injectate were placed at each foramen (level) into the transforaminal epidural space.   Additional Comments:  The patient tolerated the procedure well Dressing: Band-Aid with 2 x 2 sterile gauze    Post-procedure details: Patient was observed during the procedure. Post-procedure instructions were reviewed.  Patient left the clinic in stable condition.

## 2020-02-17 NOTE — Progress Notes (Signed)
Lisa Mcclure - 86 y.o. female MRN [id number]  Date of birth: Dec 29, 1932  Office Visit Note: Visit Date: 01/29/2020 PCP: [name], MD Referred by: [name], MD  Subjective: Chief Complaint  Patient presents with  . Lower Back - Pain   HPI:  Lisa Mcclure is a 86 y.o. female who comes in today For evaluation management of chronic worsening severe low back pain with left buttock and hamstring pain.  We have seen Mrs. Daughter in the past for osteoarthritis of her shoulders as well as her hips.  She has done well in the past with greater trochanteric bursa injection but this is more posterior and down into the leg mostly with sitting and walking.  History of lumbar spondylosis history of prior facet joint block.  She is here with her daughter today who provides some of the history.  She has had no focal weakness no trauma etc.  No red flag complaints.  She does take chronic pain medication.  She does try to stay active.  She does not have recent MRI of the lumbar spine.  Last images show listhesis of L5 on S1 with facet arthropathy.  She has had no prior lumbar surgery.  She has no right-sided complaints.  Review of Systems  Musculoskeletal: Positive for back pain and joint pain.  All other systems reviewed and are negative.  Otherwise per HPI.  Assessment & Plan: Visit Diagnoses:  1. Lumbar radiculopathy   2. Spondylolisthesis of lumbosacral region   3. Spondylosis without myelopathy or radiculopathy, lumbar region   4. Pain in left hip     Plan: Findings:  Chronic history of back and hip pain mostly with bursitis in the past but also with some axial back pain felt to be spondylitic in nature.  Now with more radicular complaints in the left posterior leg.  This seems to be more of an S1 nerve root type irritation given the listhesis of L5 on S1.  Given that there is no red flag complaints I think diagnostic S1 injection with epidurogram can be performed today.  If  she does not get much relief from that or is very short-lived I would recommend MRI of the lumbar spine.  Alternative diagnosis would be ischial bursitis.  She has no groin pain on exam or clinically.    Meds & Orders:  Meds ordered this encounter  Medications  . betamethasone acetate-betamethasone sodium phosphate (CELESTONE) injection 12 mg    Orders Placed This Encounter  Procedures  . XR C-ARM NO REPORT  . Epidural Steroid injection    Follow-up: Return if symptoms worsen or fail to improve.   Procedures: No procedures performed  S1 Lumbosacral Transforaminal Epidural Steroid Injection - Sub-Pedicular Approach with Fluoroscopic Guidance   Patient: Lisa Mcclure      Date of Birth: [birth year] MRN: [id number] PCP: [name], MD      Visit Date: 01/29/2020   Universal Protocol:    Date/Time: 11/08/215:39 AM  Consent Given By: the patient  Position:  PRONE  Additional Comments: Vital signs were monitored before and after the procedure. Patient was prepped and draped in the usual sterile fashion. The correct patient, procedure, and site was verified.   Injection Procedure Details:  Procedure Site One Meds Administered:  Meds ordered this encounter  Medications  . betamethasone acetate-betamethasone sodium phosphate (CELESTONE) injection 12 mg    Laterality: Left  Location/Site:  S1 Foramen   Needle size: 22 ga.  Needle type: Spinal  Needle Placement: Transforaminal  Findings:   -Comments: Excellent flow of contrast along the nerve and into the epidural space.  Epidurogram: Contrast epidurogram showed no nerve root cut off or restricted flow pattern.  Procedure Details: After squaring off the sacral end-plate to get a true AP view, the C-arm was positioned so that the best possible view of the S1 foramen was visualized. The soft tissues overlying this structure were infiltrated with 2-3 ml. of 1% Lidocaine without Epinephrine.    The spinal  needle was inserted toward the target using a "trajectory" view along the fluoroscope beam.  Under AP and lateral visualization, the needle was advanced so it did not puncture dura. Biplanar projections were used to confirm position. Aspiration was confirmed to be negative for CSF and/or blood. A 1-2 ml. volume of Isovue-250 was injected and flow of contrast was noted at each level. Radiographs were obtained for documentation purposes.   After attaining the desired flow of contrast documented above, a 0.5 to 1.0 ml test dose of 0.25% Marcaine was injected into each respective transforaminal space.  The patient was observed for 90 seconds post injection.  After no sensory deficits were reported, and normal lower extremity motor function was noted,   the above injectate was administered so that equal amounts of the injectate were placed at each foramen (level) into the transforaminal epidural space.   Additional Comments:  The patient tolerated the procedure well Dressing: Band-Aid with 2 x 2 sterile gauze    Post-procedure details: Patient was observed during the procedure. Post-procedure instructions were reviewed.  Patient left the clinic in stable condition.     Clinical History: LUMBAR SPINE - COMPLETE 4+ VIEW  COMPARISON: Chest x-ray dated 03/05/2017.  FINDINGS: There is an age-indeterminate compression fracture of the T12 and T11 vertebral bodies. There is some mild height loss of L2 vertebral body. Degenerative changes are noted throughout the visualized lumbar spine. These are greatest at the lower lumbar segments were there is multilevel facet arthrosis and significant multilevel disc height loss. There appears to be a grade 1-2 anterolisthesis of L5 on S1. Diffuse osteopenia is noted.  IMPRESSION: 1. Age-indeterminate height loss of the T11, T12, and L2 vertebral bodies. Correlation with point tenderness is recommended. If there is clinical concern for an acute fracture  this can be further evaluated with cross-sectional imaging. 2. Multilevel degenerative changes throughout the lumbar spine. 3. Grade 1-2 anterolisthesis of L5 on S1. 4. Osteopenia.   Electronically Signed By: Constance Holster M.D. On: 09/14/2018 19:25 ------- EXAM: PELVIS - 1-2 VIEW  COMPARISON:  None.  FINDINGS: There is no acute displaced fracture or dislocation. There is advanced osteoarthritis of the right hip. There is mild-to-moderate osteoarthritis of the left hip. There are degenerative changes of the pubic symphysis. There is osteopenia.  IMPRESSION: 1. No acute displaced fracture or dislocation. 2. Advanced osteoarthritis of the right hip. 3. Mild-to-moderate osteoarthritis of the left hip. 4. Osteopenia which limits detection of nondisplaced fractures.   Electronically Signed   By: Constance Holster M.D.   On: 09/14/2018 19:20     Objective:  VS:  HT:    WT:   BMI:     BP:105/73  HR:(!) 58bpm  TEMP: ( )  RESP:  Physical Exam Constitutional:      General: She is not in acute distress.    Appearance: Normal appearance. She is obese. She is not ill-appearing.  HENT:     Head: Normocephalic and atraumatic.     Right  Ear: External ear normal.     Left Ear: External ear normal.  Eyes:     Extraocular Movements: Extraocular movements intact.  Cardiovascular:     Rate and Rhythm: Normal rate.     Pulses: Normal pulses.  Musculoskeletal:     Right lower leg: No edema.     Left lower leg: No edema.     Comments: Patient has good distal strength with no pain over the greater trochanters.  No clonus or focal weakness.  No pain with hip rotation.  She has pain going from sit to stand with full extension and facet loading.  Skin:    Findings: No erythema, lesion or rash.  Neurological:     General: No focal deficit present.     Mental Status: She is alert and oriented to person, place, and time.     Sensory: No sensory deficit.     Motor: No  weakness or abnormal muscle tone.     Coordination: Coordination normal.  Psychiatric:        Mood and Affect: Mood normal.        Behavior: Behavior normal.      Imaging: No results found.

## 2020-02-18 DIAGNOSIS — I110 Hypertensive heart disease with heart failure: Secondary | ICD-10-CM | POA: Diagnosis not present

## 2020-02-18 DIAGNOSIS — Z48812 Encounter for surgical aftercare following surgery on the circulatory system: Secondary | ICD-10-CM | POA: Diagnosis not present

## 2020-02-18 DIAGNOSIS — U071 COVID-19: Secondary | ICD-10-CM | POA: Diagnosis not present

## 2020-02-18 DIAGNOSIS — I5032 Chronic diastolic (congestive) heart failure: Secondary | ICD-10-CM | POA: Diagnosis not present

## 2020-02-18 DIAGNOSIS — I4891 Unspecified atrial fibrillation: Secondary | ICD-10-CM | POA: Diagnosis not present

## 2020-02-18 DIAGNOSIS — I0981 Rheumatic heart failure: Secondary | ICD-10-CM | POA: Diagnosis not present

## 2020-02-18 NOTE — Progress Notes (Signed)
Lisa Mcclure spoke with and discussed results with her.

## 2020-02-18 NOTE — Progress Notes (Signed)
Please inform patient blood counts are normal, she is not anemic. Kidney function remains stable.

## 2020-02-25 DIAGNOSIS — I5032 Chronic diastolic (congestive) heart failure: Secondary | ICD-10-CM | POA: Diagnosis not present

## 2020-02-25 DIAGNOSIS — R202 Paresthesia of skin: Secondary | ICD-10-CM | POA: Diagnosis not present

## 2020-02-25 DIAGNOSIS — I4891 Unspecified atrial fibrillation: Secondary | ICD-10-CM | POA: Diagnosis not present

## 2020-02-25 DIAGNOSIS — K219 Gastro-esophageal reflux disease without esophagitis: Secondary | ICD-10-CM | POA: Diagnosis not present

## 2020-02-25 DIAGNOSIS — I714 Abdominal aortic aneurysm, without rupture: Secondary | ICD-10-CM | POA: Diagnosis not present

## 2020-02-25 DIAGNOSIS — R5383 Other fatigue: Secondary | ICD-10-CM | POA: Diagnosis not present

## 2020-02-25 DIAGNOSIS — I110 Hypertensive heart disease with heart failure: Secondary | ICD-10-CM | POA: Diagnosis not present

## 2020-02-25 DIAGNOSIS — D649 Anemia, unspecified: Secondary | ICD-10-CM | POA: Diagnosis not present

## 2020-02-25 DIAGNOSIS — R269 Unspecified abnormalities of gait and mobility: Secondary | ICD-10-CM | POA: Diagnosis not present

## 2020-03-02 DIAGNOSIS — H353114 Nonexudative age-related macular degeneration, right eye, advanced atrophic with subfoveal involvement: Secondary | ICD-10-CM | POA: Diagnosis not present

## 2020-03-02 DIAGNOSIS — H43813 Vitreous degeneration, bilateral: Secondary | ICD-10-CM | POA: Diagnosis not present

## 2020-03-02 DIAGNOSIS — H353221 Exudative age-related macular degeneration, left eye, with active choroidal neovascularization: Secondary | ICD-10-CM | POA: Diagnosis not present

## 2020-03-02 DIAGNOSIS — H43393 Other vitreous opacities, bilateral: Secondary | ICD-10-CM | POA: Diagnosis not present

## 2020-03-09 NOTE — Progress Notes (Signed)
Primary Physician/Referring:  [name], MD  [name], female    DOB: [birth year], 86 y.o.   MRN: [id number]  No chief complaint on file.  HPI:    Lisa Mcclure  is a 86 y.o.  female  with past medical history of obesity, paroxysmal atrial fibrillation, chronic diastolic CHF, hypertensive heart disease, HTN, anemia, hemorrhoids, depression, diverticulosis, esophageal reflux, OSA intolerant to CPAP on nocturnal 02, rheumatoid arthritis, and venous insufficiency. CT scan of the chest 03/2016 without definitive ILD. LM and 2 vessel CAD, mild calcification on mitral and aortic valve. S/p AAA repair on 12/20/2019.   In the past patient has been on higher doses of carvedilol, 25 mg twice daily, this was reduced due to complaints of fatigue and dizziness.  She has also previously been on amlodipine 10 mg daily, however patient stopped this for unknown reason.  Hypertension was previously well controlled with carvedilol, hydralazine, isosorbide dinitrate, losartan, furosemide.  Patient presents for follow up of hypertension. At last visit reduced carvedilol from 25 mg to 12.5 mg twice daily due to fatigue. Also increased amlodipine from 5 mg to 10 mg due to continually elevated blood pressure. Patient's only complaints today are back and knee pain. She has recently been evaluated by PCP who has sent a referral for physical therapy. Patient reports her fatigue has improved since last visit. Denies chest pain, dyspnea, dizziness, syncope, near-syncope. She is enrolled in remote patient monitoring, but has not transmitted blood pressure readings for the last 7 days.  Past Medical History:  Diagnosis Date  . Anemia    h/o hemorrhoidal bleeding and blood transfusion  . Cardiomegaly   . Chronic diastolic CHF (congestive heart failure) ([place])   . Depression   . Diverticulosis   . DOE (dyspnea on exertion)   . Esophageal reflux   . GERD (gastroesophageal reflux disease)   .  Hypothyroidism   . Insomnia   . LBP (low back pain)   . OAB (overactive bladder)   . Obesity   . OSA (obstructive sleep apnea)   . Osteoarthritis   . Rheumatoid arthritis(714.0)   . Shoulder pain, bilateral   . Unspecified essential hypertension   . Venous insufficiency    Past Surgical History:  Procedure Laterality Date  . ABDOMINAL AORTIC ENDOVASCULAR STENT GRAFT N/A 12/20/2019   Procedure: Aortogram including catheter selection of aorta and bilateral iliac arteriogram, Endovascular repair of infrarenal abdominal aortic aneurysm with bifurcated stent graft (26 mm x 14 x 12 main body, right bell bottom with a 20 mm x 10 cm piece, and left bell bottom with a 16 mm x 12 cm piece) ;  Surgeon: Marty Heck, MD;  Location: [facility];  Service: Vascular;  Laterality: N/A;  . APPENDECTOMY  1953  . bladder abduction-1996  1996  . breast biopsy Right 1980  . CARDIOVERSION N/A 12/24/2019   Procedure: CARDIOVERSION;  Surgeon: Adrian Prows, MD;  Location: [facility] ENDOSCOPY;  Service: Cardiovascular;  Laterality: N/A;  . [place]  . COSMETIC SURGERY  1996  . CYSTOCELE REPAIR    . FLEXIBLE SIGMOIDOSCOPY N/A 04/10/2015   Procedure: FLEXIBLE SIGMOIDOSCOPY;  Surgeon: Arta Silence, MD;  Location: [facility] ENDOSCOPY;  Service: Endoscopy;  Laterality: N/A;  . knee arthroscopy Right 1996, 2010  . KNEE ARTHROSCOPY W/ AUTOGENOUS CARTILAGE IMPLANTATION (ACI) PROCEDURE Left 1994, 1995  . SHOULDER SURGERY  1990  . TEE WITHOUT CARDIOVERSION N/A 12/24/2019   Procedure: TRANSESOPHAGEAL ECHOCARDIOGRAM (  TEE);  Surgeon: Adrian Prows, MD;  Location: [place];  Service: Cardiovascular;  Laterality: N/A;  . TOTAL ABDOMINAL HYSTERECTOMY  1972  . ULTRASOUND GUIDANCE FOR VASCULAR ACCESS Bilateral 12/20/2019   Procedure: Ultrasound-guided access of bilateral common femoral arteries for delivery of endograft and percutaneous closure;  Surgeon: Marty Heck, MD;  Location: [facility] OR;  Service:  Vascular;  Laterality: Bilateral;  . VESICOVAGINAL FISTULA CLOSURE W/ TAH    . WRIST SURGERY  1967   Family History  Problem Relation Age of Onset  . COPD Father   . Lung cancer Mother   . Heart attack Maternal Grandmother 80  . Breast cancer Maternal Aunt   . Lung cancer Son     Social History   Tobacco Use  . Smoking status: Former Smoker    Packs/day: 0.20    Years: 10.00    Pack years: 2.00    Types: Cigarettes    Quit date: 04/11/1984    Years since quitting: 35.9  . Smokeless tobacco: Never Used  Substance Use Topics  . Alcohol use: Yes    Alcohol/week: 0.0 standard drinks    Comment: cocktail once a week   Marital Status: Widowed  ROS  Review of Systems  Constitutional: Positive for malaise/fatigue (improving).  Cardiovascular: Positive for dyspnea on exertion (Chronic, stable). Negative for chest pain, leg swelling, orthopnea, palpitations, paroxysmal nocturnal dyspnea and syncope.  Hematologic/Lymphatic: Does not bruise/bleed easily.  Musculoskeletal: Positive for back pain and joint pain (bilateral knees).  Gastrointestinal: Negative for melena.  Neurological: Positive for dizziness. Negative for headaches and light-headedness.   Objective  There were no vitals taken for this visit.  Vitals with BMI 02/11/2020 01/29/2020 01/16/2020  Height _0  - _1   Weight 195 lbs - 197 lbs  BMI 58.85 - 02.77  Systolic 412 878 676  Diastolic 43 73 66  Pulse 63 58 71     Physical Exam Vitals reviewed.  Constitutional:      General: She is not in acute distress.    Appearance: She is well-developed.  Cardiovascular:     Rate and Rhythm: Normal rate and regular rhythm.     Pulses: Intact distal pulses.          Carotid pulses are 2+ on the right side and 2+ on the left side.      Radial pulses are 2+ on the right side and 2+ on the left side.     Heart sounds: S1 normal and S2 normal. No murmur heard.  Harsh early systolic murmur is present at the upper right sternal  border radiating to the apex.  No gallop.      Comments: Trace bilateral ankle edema. No JVD.  Pulmonary:     Effort: Pulmonary effort is normal. No accessory muscle usage or respiratory distress.     Breath sounds: Normal breath sounds.  Neurological:     Mental Status: She is alert.    Laboratory examination:   Recent Labs    12/23/19 0350 12/24/19 0627 12/25/19 0339  NA 138 134* 134*  K 4.7 4.2 3.9  CL 100 97* 97*  CO2 _2 GLUCOSE 89 109* 100*  BUN _3 CREATININE 0.96 0.89 0.87  CALCIUM 8.3* 8.4* 8.4*  GFRNONAA 54* 60* >60  GFRAA >60 >60 >60   CrCl cannot be calculated (Patient's most recent lab result is older than the maximum 21 days allowed.).  CMP Latest Ref Rng & Units 12/25/2019 12/24/2019 12/23/2019  Glucose 70 - 99 mg/dL 100(H) 109(H) 89  BUN 8 - 23 mg/dL _0 Creatinine 0.44 - 1.00 mg/dL 0.87 0.89 0.96  Sodium 135 - 145 mmol/L 134(L) 134(L) 138  Potassium 3.5 - 5.1 mmol/L 3.9 4.2 4.7  Chloride 98 - 111 mmol/L 97(L) 97(L) 100  CO2 22 - 32 mmol/L _1 Calcium 8.9 - 10.3 mg/dL 8.4(L) 8.4(L) 8.3(L)  Total Protein 6.5 - 8.1 g/dL - - 7.1  Total Bilirubin 0.3 - 1.2 mg/dL - - 0.5  Alkaline Phos 38 - 126 U/L - - 70  AST 15 - 41 U/L - - 39  ALT 0 - 44 U/L - - 36   CBC Latest Ref Rng & Units 12/25/2019 12/24/2019 12/23/2019  WBC 4.0 - 10.5 K/uL 8.8 10.5 12.4(H)  Hemoglobin 12.0 - 15.0 g/dL 9.1(L) 9.1(L) 9.9(L)  Hematocrit 36 - 46 % 30.7(L) 30.0(L) 32.5(L)  Platelets 150 - 400 K/uL 309 317 354   Lipid Panel    Component Value Date/Time   CHOL 126 03/14/2019 1121   TRIG 82 03/14/2019 1121   HDL 49 03/14/2019 1121   CHOLHDL 5.1 CALC 12/19/2006 0916   VLDL 26 12/19/2006 0916   LDLCALC 61 03/14/2019 1121   LDLDIRECT 159.2 12/19/2006 0916    HEMOGLOBIN A1C No results found for: HGBA1C, MPG TSH Recent Labs    12/23/19 1424  TSH 0.724    External labs:  02/17/2020:  Hemoglobin 11.1, hematocrit 34.2, platelets 383 BUN 26, creatinine  0.9, EGFR 59, sodium 136, potassium 4.6  01/07/2020: BUN 29, GFR 57, creatinine 0.93, BUN/CR 31, sodium 133, potassium 4.6, chloride 93, BMP otherwise normal  Cholesterol, total 164.000 m 06/21/2019 Triglycerides 93.000 06/21/2019 HDL 46 MG/DL 06/21/2019 LDL 99.000 mg 06/21/2019  Glucose Random 87.000 mg 06/21/2019 BUN 25.000 mg 06/21/2019 Creatinine, Serum 0.800 mg/ 06/21/2019  TSH 3.220 micr 06/21/2019  AST 18, ALT 11 09/14/2018  FOBT: Normal 04/24/2018  MicroAlbumin Urine 13.000 03/23/2017 MicroAlbumin/Creat 37.9 MG/ 03/23/2017  Medications and allergies   Allergies  Allergen Reactions  . Statins Other (See Comments)    Muscle aches and INTERNAL BLEEDING  . Gabapentin Other (See Comments)    "Made me loopy"  . Methocarbamol Other (See Comments)    "Made me loopy"     Current Outpatient Medications  Medication Instructions  . amiodarone (PACERONE) 100 mg, Oral, Daily  . amLODipine (NORVASC) 10 mg, Oral, Daily  . apixaban (ELIQUIS) 5 mg, Oral, 2 times daily  . aspirin 81 mg, Oral, Daily, Swallow whole.  . Bempedoic Acid-Ezetimibe (NEXLIZET) 180-10 MG TABS 1 capsule, Oral, Daily  . busPIRone (BUSPAR) 7.5 mg, Oral, As needed  . carvedilol (COREG) 12.5 mg, Oral, 2 times daily  . diclofenac sodium (VOLTAREN) 2.25 g, Topical, 3 times daily PRN  . hydrochlorothiazide (MICROZIDE) 12.5 mg, Oral, Daily  . HYDROcodone-acetaminophen (NORCO) 7.5-325 MG tablet 1 tablet, Oral, Every 6 hours PRN  . isosorbide-hydrALAZINE (BIDIL) 20-37.5 MG tablet 1 tablet, Oral, 3 times daily  . latanoprost (XALATAN) 0.005 % ophthalmic solution 1 drop, Both Eyes, Daily at bedtime  . levothyroxine (SYNTHROID) 125 mcg, Oral, Daily before breakfast  . losartan (COZAAR) 100 MG tablet TAKE 1 TABLET(100 MG) BY MOUTH TWICE DAILY  . Multiple Vitamins-Minerals (PRESERVISION AREDS 2+MULTI VIT PO) 1 tablet, Oral, 2 times daily  . omeprazole (PRILOSEC) 20 mg, Oral, Daily  . rOPINIRole (REQUIP) 1 mg, Oral, See admin  instructions, Take 1 mg by mouth once a day between 3 PM-4 PM and an  additional 1 mg in the evening, if no relief  . Vitamin D3 5,000 Units, Oral, Daily with breakfast, Taking it at lunchtime   There are no discontinued medications.  Radiology:   No results found.  CT Abdomen and Pelvis W Contrast 12/20/2019: IMPRESSION: 1. Large Infrarenal Abdominal Aortic Aneurysm measuring up to 5.7 cm with mild perianeurysmal inflammation. Differential considerations include impending AAA rupture, mycotic AAA, symptomatic AAA. Recommend urgent transfer to emergency department and Vascular Surgery consultation. Aortic aneurysm NOS (ICD10-I71.9). Aortic Atherosclerosis (ICD10-I70.0). 2. No other acute or inflammatory process identified. 5 cm fat containing left pelvic hernia. Large bowel diverticulosis. 3. Simple appearing 4 cm left ovarian cyst, recommend follow-up Ultrasound in 6-12 months  Chest X-ray 12/20/2019:  FINDINGS: Diffuse cardiac enlargement. Emphysematous changes in the lungs. Coarse interstitial pattern to the lungs could represent fibrosis or edema. No focal consolidation. No pleural effusions. No pneumothorax. Calcified and tortuous aorta. Degenerative changes in the shoulders and spine. Old rib fractures. Similar appearance to previous study. IMPRESSION: 1. Cardiac enlargement. 2. Coarse interstitial pattern to the lungs could represent fibrosis or edema.  Abdominal X-Ray 12/20/2019:  IMPRESSION: Interval placement of aorto iliac stent graft. Nonobstructive bowel gas pattern.  Cardiac Studies:   Lexiscan myoview stress test 12/08/2017: 1. Lexiscan stress test was performed. Exercise capacity was not assessed. Stress symptoms included abdominal pain. Blood pressure was normal. The resting and stress electrocardiogram demonstrated normal sinus rhythm, normal resting conduction, no resting arrhythmias and normal rest repolarization. 2. The overall quality of the study is good.  There is no evidence of abnormal lung activity. Stress and rest SPECT images demonstrate homogeneous tracer distribution throughout the myocardium. Gated SPECT imaging reveals normal myocardial thickening and wall motion. The left ventricular ejection fraction was normal (54%). 3. Low risk study  Renal artery duplex 09/27/2017: Hemodynamically significant stenosis bilaterally. Renal length is within normal limits for both kidneys. There is an incidental 3.9x3.4x3.4 cm proximal and mid abdominal aortic aneurysm. Recommend dedicated abdominal aortic duplex scan to further define AAA.  Abdominal Aortic Duplex  01/01/2019: Moderate dilatation of the abdominal aorta is noted in the proximal, mid and distal aorta. An abdominal aortic aneurysm measuring 4.5 x 4.52 x 4.52 cm is seen.  Recheck in 6 months for stability of the aneurysm.  Echocardiogram 01/25/2019: Left ventricle cavity is normal in size. Severe concentric hypertrophy of the left ventricle. Normal LV systolic function with visual EF 50-55%. Normal global wall motion. Doppler evidence of grade I (impaired) diastolic dysfunction, normal LAP. Calculated EF 47%. Left atrial cavity is mildly dilated. Trileaflet aortic valve with mild calcification of the aortic valve annulus and leaflets.  Mild aortic valve stenosis. Aortic valve mean gradient of 12 mmHg, Vmax of 2.3  m/s. Calculated aortic valve area by continuity equation is 1.3 cm. Trace aortic regurgitation. Mild (Grade I) mitral regurgitation. Mild tricuspid regurgitation. Estimated pulmonary artery systolic pressure is 25 mmHg. No significant change compared to previous study on 09/27/2017.  TEE 12/24/2019: 1. Left ventricular ejection fraction, by estimation, is 50 to 55%. The left ventricle has low normal function. Left ventricular diastolic function could not be evaluated.  2. Right ventricular systolic function is normal. The right ventricular size is normal.  3. Left atrial size  was moderately dilated. No left atrial/left atrial appendage thrombus was detected. The LAA emptying velocity was 30 cm/s.  4. The mitral valve is normal in structure. Mild mitral valve regurgitation. No evidence of mitral stenosis.  5. The aortic valve is calcified. Aortic valve  regurgitation is not visualized. Mild aortic valve stenosis. Aortic valve mean gradient measures 10.8 mmHg.  6. There is Moderate (Grade III) layered plaque involving the ascending, transverse and descending aorta.   EKG   EKG 01/02/2020: Sinus bradycardia at a rate of 54 bpm with borderline first-degree AV block.  Normal axis.  Poor R wave progression cannot exclude anterior infarct old. Compared to 01/08/2020, bradycardia new.   EKG 01/08/2019: Normal sinus rhythm at 63 bpm with first degree AV block, normal axis, no evidence of ischemia.   Assessment     ICD-10-CM   1. Primary hypertension  I10   2. Chronic diastolic (congestive) heart failure (HCC)  I50.32   3. Paroxysmal atrial fibrillation (HCC)  I48.0   4. Other fatigue  R53.83    This patients CHA2DS2-VASc Score 6 (CHF, HTN, vasc, age, F) and yearly risk of stroke 9.8%.   Recommendations:   Lisa Mcclure  is a 86 y.o.  female  with past medical history of obesity, chronic diastolic CHF, hypertensive heart disease, HTN, anemia, hemorrhoids, depression, diverticulosis, esophageal reflux, OSA intolerant to CPAP on nocturnal 02, rheumatoid arthritis, and venous insufficiency. CT scan of the chest 03/2016 without definitive ILD. LM and 2 vessel CAD, mild calcification on mitral and aortic valve. S/p AAA repair on 12/20/2019.    Patient presents today for follow up of hypertension. Patient's blood pressure is now well controlled. Will continue current antihypertensive medications. I reviewed and discussed CBC results with patient. Hemoglobin remains low, which may be contributing to fatigue, will defer further management to PCP. Instructed patient to  continue monitoring home blood pressure with our office's remote monitoring program.  Of note patient continues to tolerate Eliquis and aspirin without bleeding diathesis, will continue this.  Will also continue amiodarone 100 mg daily, she will need annual TSH and liver testing.  Again discussed with patient regarding weight loss and lifestyle modifications.    Follow up in 3 months, sooner if needed.    Alethia Berthold, PA-C 03/10/2020, 1:58 PM Office: [phone]

## 2020-03-10 ENCOUNTER — Encounter: Payer: Self-pay | Admitting: Student

## 2020-03-10 ENCOUNTER — Other Ambulatory Visit: Payer: Self-pay

## 2020-03-10 ENCOUNTER — Ambulatory Visit: Payer: Medicare Other | Admitting: Student

## 2020-03-10 VITALS — BP 127/53 | HR 63 | Ht 63.0 in | Wt 200.1 lb

## 2020-03-10 DIAGNOSIS — R5383 Other fatigue: Secondary | ICD-10-CM | POA: Diagnosis not present

## 2020-03-10 DIAGNOSIS — I10 Essential (primary) hypertension: Secondary | ICD-10-CM | POA: Diagnosis not present

## 2020-03-10 DIAGNOSIS — I5032 Chronic diastolic (congestive) heart failure: Secondary | ICD-10-CM | POA: Diagnosis not present

## 2020-03-10 DIAGNOSIS — I480 Paroxysmal atrial fibrillation: Secondary | ICD-10-CM

## 2020-03-11 ENCOUNTER — Other Ambulatory Visit: Payer: Self-pay | Admitting: Student

## 2020-03-11 DIAGNOSIS — I10 Essential (primary) hypertension: Secondary | ICD-10-CM

## 2020-03-11 DIAGNOSIS — I5032 Chronic diastolic (congestive) heart failure: Secondary | ICD-10-CM

## 2020-03-19 ENCOUNTER — Ambulatory Visit: Payer: Medicare Other | Admitting: Cardiology

## 2020-03-20 ENCOUNTER — Other Ambulatory Visit: Payer: Self-pay

## 2020-03-20 DIAGNOSIS — I714 Abdominal aortic aneurysm, without rupture, unspecified: Secondary | ICD-10-CM

## 2020-03-24 ENCOUNTER — Telehealth: Payer: Self-pay

## 2020-03-24 DIAGNOSIS — I10 Essential (primary) hypertension: Secondary | ICD-10-CM | POA: Diagnosis not present

## 2020-03-24 DIAGNOSIS — R5383 Other fatigue: Secondary | ICD-10-CM | POA: Diagnosis not present

## 2020-03-24 DIAGNOSIS — I110 Hypertensive heart disease with heart failure: Secondary | ICD-10-CM | POA: Diagnosis not present

## 2020-03-24 DIAGNOSIS — I350 Nonrheumatic aortic (valve) stenosis: Secondary | ICD-10-CM | POA: Diagnosis not present

## 2020-03-24 DIAGNOSIS — I4891 Unspecified atrial fibrillation: Secondary | ICD-10-CM | POA: Diagnosis not present

## 2020-03-24 DIAGNOSIS — R269 Unspecified abnormalities of gait and mobility: Secondary | ICD-10-CM | POA: Diagnosis not present

## 2020-03-24 DIAGNOSIS — K219 Gastro-esophageal reflux disease without esophagitis: Secondary | ICD-10-CM | POA: Diagnosis not present

## 2020-03-24 DIAGNOSIS — D649 Anemia, unspecified: Secondary | ICD-10-CM | POA: Diagnosis not present

## 2020-03-24 DIAGNOSIS — Z8616 Personal history of COVID-19: Secondary | ICD-10-CM | POA: Diagnosis not present

## 2020-03-24 DIAGNOSIS — I5032 Chronic diastolic (congestive) heart failure: Secondary | ICD-10-CM | POA: Diagnosis not present

## 2020-03-24 NOTE — Telephone Encounter (Signed)
  Lisa Mcclure with [place] :   PT Eval referred by PCP. Patients HR  is irregular. Today 110/60 65, which is lower than usual, but her pulse rate has gone as low as 35, but doesn't feel any different. Patient nurse stated that patient takes all her medications as directed. Do you want to see her sooner? Please advise.   Also, patient had an aneurysm repair and patient went into Afib and had her heart shocked back into rhythm. Patient is doing great other than today with her HR issues, but has an appointment coming up in January.

## 2020-03-24 NOTE — Telephone Encounter (Signed)
For review only :  Lisa Mcclure with [facility] :   PT Eval referred by PCP. Patients HR  is irregular. Today 110/60 65, which is lower than usual, but her pulse rate has gone as low as 35, but doesn't feel any different. Patient nurse stated that patient takes all her medications as directed.  Also, patient had an aneurysm repair and patient went into Afib and had her heart shocked back into rhythm. Patient is doing great other than today, but has an appointment coming up in January.

## 2020-03-24 NOTE — Telephone Encounter (Signed)
If asymptomatic, no need to change anything but trend the heart rate

## 2020-03-24 NOTE — Telephone Encounter (Signed)
If asymptomatic, continue to trend the heart rate. Nothing to be done for now as she is asymptomatic

## 2020-03-31 DIAGNOSIS — K219 Gastro-esophageal reflux disease without esophagitis: Secondary | ICD-10-CM | POA: Diagnosis not present

## 2020-03-31 DIAGNOSIS — I5032 Chronic diastolic (congestive) heart failure: Secondary | ICD-10-CM | POA: Diagnosis not present

## 2020-03-31 DIAGNOSIS — I4891 Unspecified atrial fibrillation: Secondary | ICD-10-CM | POA: Diagnosis not present

## 2020-03-31 DIAGNOSIS — D649 Anemia, unspecified: Secondary | ICD-10-CM | POA: Diagnosis not present

## 2020-03-31 DIAGNOSIS — I350 Nonrheumatic aortic (valve) stenosis: Secondary | ICD-10-CM | POA: Diagnosis not present

## 2020-03-31 DIAGNOSIS — I110 Hypertensive heart disease with heart failure: Secondary | ICD-10-CM | POA: Diagnosis not present

## 2020-04-07 ENCOUNTER — Other Ambulatory Visit: Payer: Self-pay

## 2020-04-07 DIAGNOSIS — I714 Abdominal aortic aneurysm, without rupture, unspecified: Secondary | ICD-10-CM

## 2020-04-08 DIAGNOSIS — Z23 Encounter for immunization: Secondary | ICD-10-CM | POA: Diagnosis not present

## 2020-04-08 NOTE — Telephone Encounter (Signed)
Isosorbide dinitrate 20 mg one tablet TID and hydralazine 25 mg 1.5 tablets TID. Send for 90 day Rx

## 2020-04-08 NOTE — Telephone Encounter (Signed)
Patient called stating that she was prescribed BIDIL and it is currently backordered and was told they cant get it anywhere and they don't know for how long.Marland Kitchen Pharmacist suggested for her to take 1 1/2 Hydraliazine TID and take Isosorbide BID and that would be the equivalent of BIDIL. Patient wants to know if this is ok for her to do this. I have given her some samples of BIDIL in the meantime.  If no, MA's: Please call patient to let her know.   If yes, MA's : Please call [facility] in [facility] and speak to AMY, to let her know it's ok.

## 2020-04-09 DIAGNOSIS — I5032 Chronic diastolic (congestive) heart failure: Secondary | ICD-10-CM | POA: Diagnosis not present

## 2020-04-09 DIAGNOSIS — K219 Gastro-esophageal reflux disease without esophagitis: Secondary | ICD-10-CM | POA: Diagnosis not present

## 2020-04-09 DIAGNOSIS — D649 Anemia, unspecified: Secondary | ICD-10-CM | POA: Diagnosis not present

## 2020-04-09 DIAGNOSIS — I110 Hypertensive heart disease with heart failure: Secondary | ICD-10-CM | POA: Diagnosis not present

## 2020-04-09 DIAGNOSIS — I350 Nonrheumatic aortic (valve) stenosis: Secondary | ICD-10-CM | POA: Diagnosis not present

## 2020-04-09 DIAGNOSIS — I4891 Unspecified atrial fibrillation: Secondary | ICD-10-CM | POA: Diagnosis not present

## 2020-04-14 ENCOUNTER — Ambulatory Visit
Admission: RE | Admit: 2020-04-14 | Discharge: 2020-04-14 | Disposition: A | Discharge status: Home / Self Care | Payer: Medicare Other | Source: Ambulatory Visit | Attending: Vascular Surgery | Admitting: Vascular Surgery

## 2020-04-14 DIAGNOSIS — K4090 Unilateral inguinal hernia, without obstruction or gangrene, not specified as recurrent: Secondary | ICD-10-CM | POA: Diagnosis not present

## 2020-04-14 DIAGNOSIS — I714 Abdominal aortic aneurysm, without rupture, unspecified: Secondary | ICD-10-CM

## 2020-04-14 DIAGNOSIS — Z01818 Encounter for other preprocedural examination: Secondary | ICD-10-CM | POA: Diagnosis not present

## 2020-04-14 DIAGNOSIS — N83292 Other ovarian cyst, left side: Secondary | ICD-10-CM | POA: Diagnosis not present

## 2020-04-14 MED ORDER — IOPAMIDOL (ISOVUE-370) INJECTION 76%
75.0000 mL | Freq: Once | INTRAVENOUS | Status: AC | PRN
  Administered 2020-04-14: 60 mL via INTRAVENOUS

## 2020-04-16 DIAGNOSIS — I4891 Unspecified atrial fibrillation: Secondary | ICD-10-CM | POA: Diagnosis not present

## 2020-04-16 NOTE — Telephone Encounter (Signed)
Called pharmacy. Changes have been made.

## 2020-04-18 NOTE — Progress Notes (Signed)
Lab 04/14/2020:  Serum glucose 86 mg, BUN 22, creatinine 1.13, EGFR 44 mL, sodium 138, potassium 4.3.  CMP otherwise normal.  Hb 10.2/HCT 31.5, platelets 383.

## 2020-04-20 ENCOUNTER — Encounter: Payer: Self-pay | Admitting: Student

## 2020-04-20 ENCOUNTER — Other Ambulatory Visit: Payer: Self-pay

## 2020-04-20 ENCOUNTER — Ambulatory Visit: Payer: Medicare Other | Admitting: Student

## 2020-04-20 VITALS — BP 132/65 | HR 63 | Resp 16 | Ht 63.0 in | Wt 208.0 lb

## 2020-04-20 DIAGNOSIS — R0602 Shortness of breath: Secondary | ICD-10-CM | POA: Diagnosis not present

## 2020-04-20 DIAGNOSIS — I5032 Chronic diastolic (congestive) heart failure: Secondary | ICD-10-CM

## 2020-04-20 DIAGNOSIS — R0600 Dyspnea, unspecified: Secondary | ICD-10-CM

## 2020-04-20 DIAGNOSIS — R0609 Other forms of dyspnea: Secondary | ICD-10-CM | POA: Diagnosis not present

## 2020-04-20 DIAGNOSIS — I10 Essential (primary) hypertension: Secondary | ICD-10-CM

## 2020-04-20 MED ORDER — FUROSEMIDE 20 MG PO TABS: 20.0000 mg | ORAL_TABLET | Freq: Every day | ORAL | 3 refills | Status: DC

## 2020-04-20 NOTE — Progress Notes (Signed)
Primary Physician/Referring:  [name], MD  [name], female    DOB: September 17, 1932, 87 y.o.   MRN: [id number]  Chief Complaint  Patient presents with  . Hypertension  . Congestive Heart Failure  . Follow-up   HPI:    Lisa Mcclure  is a 87 y.o.  female  with past medical history of obesity, paroxysmal atrial fibrillation, chronic diastolic CHF, hypertensive heart disease, HTN, anemia, hemorrhoids, depression, diverticulosis, esophageal reflux, OSA intolerant to CPAP on nocturnal 02, rheumatoid arthritis, and venous insufficiency. CT scan of the chest 03/2016 without definitive ILD. LM and 2 vessel CAD, mild calcification on mitral and aortic valve. S/p AAA repair on 12/20/2019.   In the past patient has been on higher doses of carvedilol, 25 mg twice daily, this was reduced due to complaints of fatigue and dizziness.  She has also previously been on amlodipine 10 mg daily, however patient stopped this for unknown reason.   Patient presents for follow-up with complaints of worsening leg swelling and shortness of breath over the last 2 weeks.  Blood pressure remains well controlled per remote patient monitoring..  Denies orthopnea, PND, chest pain, dizziness, syncope, near syncope.  Increased swelling over the last 2 weeks with worsening shortness of breath. No orthopnea, PND.  Patient has also gained 8 pounds since last visit.  She reports she continues to monitor her salt intake closely, and does her best to avoid high salt intake.  Patient does continue to do physical therapy approximately 1 day/week.  Past Medical History:  Diagnosis Date  . Anemia    h/o hemorrhoidal bleeding and blood transfusion  . Cardiomegaly   . Chronic diastolic CHF (congestive heart failure) ([place])   . Depression   . Diverticulosis   . DOE (dyspnea on exertion)   . Esophageal reflux   . GERD (gastroesophageal reflux disease)   . Hypothyroidism   . Insomnia   . LBP (low back  pain)   . OAB (overactive bladder)   . Obesity   . OSA (obstructive sleep apnea)   . Osteoarthritis   . Rheumatoid arthritis(714.0)   . Shoulder pain, bilateral   . Unspecified essential hypertension   . Venous insufficiency    Past Surgical History:  Procedure Laterality Date  . ABDOMINAL AORTIC ENDOVASCULAR STENT GRAFT N/A 12/20/2019   Procedure: Aortogram including catheter selection of aorta and bilateral iliac arteriogram, Endovascular repair of infrarenal abdominal aortic aneurysm with bifurcated stent graft (26 mm x 14 x 12 main body, right bell bottom with a 20 mm x 10 cm piece, and left bell bottom with a 16 mm x 12 cm piece) ;  Surgeon: Marty Heck, MD;  Location: [facility];  Service: Vascular;  Laterality: N/A;  . APPENDECTOMY  1953  . bladder abduction-1996  1996  . breast biopsy Right 1980  . CARDIOVERSION N/A 12/24/2019   Procedure: CARDIOVERSION;  Surgeon: Adrian Prows, MD;  Location: [facility] ENDOSCOPY;  Service: Cardiovascular;  Laterality: N/A;  . [place]  . COSMETIC SURGERY  1996  . CYSTOCELE REPAIR    . FLEXIBLE SIGMOIDOSCOPY N/A 04/10/2015   Procedure: FLEXIBLE SIGMOIDOSCOPY;  Surgeon: Arta Silence, MD;  Location: [facility] ENDOSCOPY;  Service: Endoscopy;  Laterality: N/A;  . knee arthroscopy Right 1996, 2010  . KNEE ARTHROSCOPY W/ AUTOGENOUS CARTILAGE IMPLANTATION (ACI) PROCEDURE Left 1994, 1995  . REFRACTIVE SURGERY  01/2020  . SHOULDER SURGERY  1990  . TEE WITHOUT CARDIOVERSION N/A  12/24/2019   Procedure: TRANSESOPHAGEAL ECHOCARDIOGRAM (TEE);  Surgeon: Adrian Prows, MD;  Location: [facility];  Service: Cardiovascular;  Laterality: N/A;  . TOTAL ABDOMINAL HYSTERECTOMY  1972  . ULTRASOUND GUIDANCE FOR VASCULAR ACCESS Bilateral 12/20/2019   Procedure: Ultrasound-guided access of bilateral common femoral arteries for delivery of endograft and percutaneous closure;  Surgeon: Marty Heck, MD;  Location: [facility] OR;  Service: Vascular;   Laterality: Bilateral;  . VESICOVAGINAL FISTULA CLOSURE W/ TAH    . WRIST SURGERY  1967   Family History  Problem Relation Age of Onset  . COPD Father   . Lung cancer Mother   . Heart attack Maternal Grandmother 80  . Breast cancer Maternal Aunt   . Lung cancer Son     Social History   Tobacco Use  . Smoking status: Former Smoker    Packs/day: 0.20    Years: 10.00    Pack years: 2.00    Types: Cigarettes    Quit date: 04/11/1984    Years since quitting: 36.0  . Smokeless tobacco: Never Used  Substance Use Topics  . Alcohol use: Yes    Alcohol/week: 0.0 standard drinks    Comment: cocktail once a week   Marital Status: Widowed  ROS  Review of Systems  Constitutional: Positive for malaise/fatigue (improving).  Cardiovascular: Positive for dyspnea on exertion (Chronic, stable). Negative for chest pain, leg swelling, orthopnea, palpitations, paroxysmal nocturnal dyspnea and syncope.  Respiratory: Positive for shortness of breath (worsening over the last 2 weeks).   Hematologic/Lymphatic: Does not bruise/bleed easily.  Musculoskeletal: Positive for back pain and joint pain (bilateral knees).  Gastrointestinal: Negative for melena.  Neurological: Positive for dizziness. Negative for headaches and light-headedness.   Objective  Blood pressure 132/65, pulse 63, resp. rate 16, height _0  (1.6 m), weight 208 lb (94.3 kg), SpO2 94 %.  Vitals with BMI 04/20/2020 03/10/2020 02/11/2020  Height _1  _2  _3   Weight 208 lbs 200 lbs 2 oz 195 lbs  BMI 36.85 62.56 38.93  Systolic 734 287 681  Diastolic 65 53 43  Pulse 63 63 63     Physical Exam Vitals reviewed.  Constitutional:      General: She is not in acute distress.    Appearance: She is well-developed.  Cardiovascular:     Rate and Rhythm: Normal rate and regular rhythm.     Pulses: Intact distal pulses.          Carotid pulses are 2+ on the right side and 2+ on the left side.      Radial pulses are 2+ on the right  side and 2+ on the left side.     Heart sounds: S1 normal and S2 normal. No murmur heard.  Harsh early systolic murmur is present at the upper right sternal border radiating to the apex. No gallop.      Comments: No JVD.  Pulmonary:     Effort: Pulmonary effort is normal. No accessory muscle usage or respiratory distress.     Breath sounds: Normal breath sounds.  Musculoskeletal:     Right lower leg: Edema (mild) present.     Left lower leg: Edema (mild) present.  Neurological:     Mental Status: She is alert.    Laboratory examination:   Recent Labs    12/23/19 0350 12/24/19 0627 12/25/19 0339  NA 138 134* 134*  K 4.7 4.2 3.9  CL 100 97* 97*  CO2 _4 GLUCOSE 89 109* 100*  BUN '17 19 18  '$ CREATININE 0.96 0.89 0.87  CALCIUM 8.3* 8.4* 8.4*  GFRNONAA 54* 60* >60  GFRAA >60 >60 >60   CrCl cannot be calculated (Patient's most recent lab result is older than the maximum 21 days allowed.).  CMP Latest Ref Rng & Units 12/25/2019 12/24/2019 12/23/2019  Glucose 70 - 99 mg/dL 100(H) 109(H) 89  BUN 8 - 23 mg/dL $Remove'18 19 17  'sVvWkEs$ Creatinine 0.44 - 1.00 mg/dL 0.87 0.89 0.96  Sodium 135 - 145 mmol/L 134(L) 134(L) 138  Potassium 3.5 - 5.1 mmol/L 3.9 4.2 4.7  Chloride 98 - 111 mmol/L 97(L) 97(L) 100  CO2 22 - 32 mmol/L $RemoveB'28 27 31  'HzyiNgQa$ Calcium 8.9 - 10.3 mg/dL 8.4(L) 8.4(L) 8.3(L)  Total Protein 6.5 - 8.1 g/dL - - 7.1  Total Bilirubin 0.3 - 1.2 mg/dL - - 0.5  Alkaline Phos 38 - 126 U/L - - 70  AST 15 - 41 U/L - - 39  ALT 0 - 44 U/L - - 36   CBC Latest Ref Rng & Units 12/25/2019 12/24/2019 12/23/2019  WBC 4.0 - 10.5 K/uL 8.8 10.5 12.4(H)  Hemoglobin 12.0 - 15.0 g/dL 9.1(L) 9.1(L) 9.9(L)  Hematocrit 36.0 - 46.0 % 30.7(L) 30.0(L) 32.5(L)  Platelets 150 - 400 K/uL 309 317 354   Lipid Panel    Component Value Date/Time   CHOL 126 03/14/2019 1121   TRIG 82 03/14/2019 1121   HDL 49 03/14/2019 1121   CHOLHDL 5.1 CALC 12/19/2006 0916   VLDL 26 12/19/2006 0916   LDLCALC 61 03/14/2019 1121    LDLDIRECT 159.2 12/19/2006 0916    HEMOGLOBIN A1C No results found for: HGBA1C, MPG TSH Recent Labs    12/23/19 1424  TSH 0.724    External labs:  04/14/2020: Serum glucose 86 mg, BUN 22, creatinine 1.13, EGFR 44 mL, sodium 138, potassium 4.3. CMP otherwise normal. Hb 10.2/HCT 31.5, platelets 383.  02/17/2020:  Hemoglobin 11.1, hematocrit 34.2, platelets 383 BUN 26, creatinine 0.9, EGFR 59, sodium 136, potassium 4.6  01/07/2020: BUN 29, GFR 57, creatinine 0.93, BUN/CR 31, sodium 133, potassium 4.6, chloride 93, BMP otherwise normal  Cholesterol, total 164.000 m 06/21/2019 Triglycerides 93.000 06/21/2019 HDL 46 MG/DL 06/21/2019 LDL 99.000 mg 06/21/2019  Glucose Random 87.000 mg 06/21/2019 BUN 25.000 mg 06/21/2019 Creatinine, Serum 0.800 mg/ 06/21/2019  TSH 3.220 micr 06/21/2019  AST 18, ALT 11 09/14/2018  FOBT: Normal 04/24/2018  MicroAlbumin Urine 13.000 03/23/2017 MicroAlbumin/Creat 37.9 MG/ 03/23/2017  Medications and allergies   Allergies  Allergen Reactions  . Statins Other (See Comments)    Muscle aches and INTERNAL BLEEDING  . Gabapentin Other (See Comments)    "Made me loopy"  . Methocarbamol Other (See Comments)    "Made me loopy"     Current Outpatient Medications  Medication Instructions  . amiodarone (PACERONE) 100 mg, Oral, Daily  . amLODipine (NORVASC) 10 mg, Oral, Daily  . apixaban (ELIQUIS) 5 mg, Oral, 2 times daily  . aspirin 81 mg, Oral, Daily, Swallow whole.  . Bempedoic Acid-Ezetimibe (NEXLIZET) 180-10 MG TABS 1 capsule, Oral, Daily  . BIDIL 20-37.5 MG tablet TAKE 1 TABLET BY MOUTH THREE TIMES DAILY  . busPIRone (BUSPAR) 7.5 mg, As needed  . carvedilol (COREG) 12.5 mg, Oral, 2 times daily  . diclofenac sodium (VOLTAREN) 2.25 g, Topical, 3 times daily PRN  . furosemide (LASIX) 20 mg, Oral, Daily  . hydrochlorothiazide (MICROZIDE) 12.5 mg, Oral, Daily  . HYDROcodone-acetaminophen (NORCO) 7.5-325 MG tablet 1 tablet, Oral, Every 6 hours PRN  .  latanoprost (XALATAN) 0.005 % ophthalmic solution 1 drop, Both Eyes, Daily at bedtime  . levothyroxine (SYNTHROID) 125 mcg, Oral, Daily before breakfast  . losartan (COZAAR) 100 MG tablet TAKE 1 TABLET(100 MG) BY MOUTH TWICE DAILY  . Multiple Vitamins-Minerals (PRESERVISION AREDS 2+MULTI VIT PO) 1 tablet, Oral, 2 times daily  . omeprazole (PRILOSEC) 20 mg, Oral, Daily  . rOPINIRole (REQUIP) 1 mg, Oral, See admin instructions, Take 1 mg by mouth once a day between 3 PM-4 PM and an additional 1 mg in the evening, if no relief  . Vitamin D3 5,000 Units, Oral, Daily with breakfast, Taking it at lunchtime   There are no discontinued medications.  Radiology:   No results found.  CT Abdomen and Pelvis W Contrast 12/20/2019: IMPRESSION: 1. Large Infrarenal Abdominal Aortic Aneurysm measuring up to 5.7 cm with mild perianeurysmal inflammation. Differential considerations include impending AAA rupture, mycotic AAA, symptomatic AAA. Recommend urgent transfer to emergency department and Vascular Surgery consultation. Aortic aneurysm NOS (ICD10-I71.9). Aortic Atherosclerosis (ICD10-I70.0). 2. No other acute or inflammatory process identified. 5 cm fat containing left pelvic hernia. Large bowel diverticulosis. 3. Simple appearing 4 cm left ovarian cyst, recommend follow-up Ultrasound in 6-12 months  Chest X-ray 12/20/2019:  FINDINGS: Diffuse cardiac enlargement. Emphysematous changes in the lungs. Coarse interstitial pattern to the lungs could represent fibrosis or edema. No focal consolidation. No pleural effusions. No pneumothorax. Calcified and tortuous aorta. Degenerative changes in the shoulders and spine. Old rib fractures. Similar appearance to previous study. IMPRESSION: 1. Cardiac enlargement. 2. Coarse interstitial pattern to the lungs could represent fibrosis or edema.  Abdominal X-Ray 12/20/2019:  IMPRESSION: Interval placement of aorto iliac stent graft. Nonobstructive bowel gas  pattern.  Cardiac Studies:   Lexiscan myoview stress test 12/08/2017: 1. Lexiscan stress test was performed. Exercise capacity was not assessed. Stress symptoms included abdominal pain. Blood pressure was normal. The resting and stress electrocardiogram demonstrated normal sinus rhythm, normal resting conduction, no resting arrhythmias and normal rest repolarization. 2. The overall quality of the study is good. There is no evidence of abnormal lung activity. Stress and rest SPECT images demonstrate homogeneous tracer distribution throughout the myocardium. Gated SPECT imaging reveals normal myocardial thickening and wall motion. The left ventricular ejection fraction was normal (54%). 3. Low risk study  Renal artery duplex 09/27/2017: Hemodynamically significant stenosis bilaterally. Renal length is within normal limits for both kidneys. There is an incidental 3.9x3.4x3.4 cm proximal and mid abdominal aortic aneurysm. Recommend dedicated abdominal aortic duplex scan to further define AAA.  Abdominal Aortic Duplex  01/01/2019: Moderate dilatation of the abdominal aorta is noted in the proximal, mid and distal aorta. An abdominal aortic aneurysm measuring 4.5 x 4.52 x 4.52 cm is seen.  Recheck in 6 months for stability of the aneurysm.  Echocardiogram 01/25/2019: Left ventricle cavity is normal in size. Severe concentric hypertrophy of the left ventricle. Normal LV systolic function with visual EF 50-55%. Normal global wall motion. Doppler evidence of grade I (impaired) diastolic dysfunction, normal LAP. Calculated EF 47%. Left atrial cavity is mildly dilated. Trileaflet aortic valve with mild calcification of the aortic valve annulus and leaflets.  Mild aortic valve stenosis. Aortic valve mean gradient of 12 mmHg, Vmax of 2.3  m/s. Calculated aortic valve area by continuity equation is 1.3 cm. Trace aortic regurgitation. Mild (Grade I) mitral regurgitation. Mild tricuspid regurgitation.  Estimated pulmonary artery systolic pressure is 25 mmHg. No significant change compared to previous study on 09/27/2017.  TEE 12/24/2019: 1. Left ventricular ejection fraction,  by estimation, is 50 to 55%. The left ventricle has low normal function. Left ventricular diastolic function could not be evaluated.  2. Right ventricular systolic function is normal. The right ventricular size is normal.  3. Left atrial size was moderately dilated. No left atrial/left atrial appendage thrombus was detected. The LAA emptying velocity was 30 cm/s.  4. The mitral valve is normal in structure. Mild mitral valve regurgitation. No evidence of mitral stenosis.  5. The aortic valve is calcified. Aortic valve regurgitation is not visualized. Mild aortic valve stenosis. Aortic valve mean gradient measures 10.8 mmHg.  6. There is Moderate (Grade III) layered plaque involving the ascending, transverse and descending aorta.   EKG   EKG 01/02/2020: Sinus bradycardia at a rate of 54 bpm with borderline first-degree AV block.  Normal axis.  Poor R wave progression cannot exclude anterior infarct old. Compared to 01/08/2020, bradycardia new.   EKG 01/08/2019: Normal sinus rhythm at 63 bpm with first degree AV block, normal axis, no evidence of ischemia.   Assessment     ICD-10-CM   1. Chronic diastolic (congestive) heart failure (HCC)  Z12.45 Basic metabolic panel    Brain natriuretic peptide    PCV ECHOCARDIOGRAM COMPLETE  2. Dyspnea on exertion  R06.00 Brain natriuretic peptide    PCV ECHOCARDIOGRAM COMPLETE  3. Shortness of breath  R06.02   4. Primary hypertension  I10     Meds ordered this encounter  Medications  . furosemide (LASIX) 20 MG tablet    Sig: Take 1 tablet (20 mg total) by mouth daily.    Dispense:  90 tablet    Refill:  3   There are no discontinued medications.  This patients CHA2DS2-VASc Score 6 (CHF, HTN, vasc, age, F) and yearly risk of stroke 9.8%.   Recommendations:   Lisa Mcclure  is a 87 y.o.  female  with past medical history of obesity, chronic diastolic CHF, hypertensive heart disease, HTN, anemia, hemorrhoids, depression, diverticulosis, esophageal reflux, OSA intolerant to CPAP on nocturnal 02, rheumatoid arthritis, and venous insufficiency. CT scan of the chest 03/2016 without definitive ILD. LM and 2 vessel CAD, mild calcification on mitral and aortic valve. S/p AAA repair on 12/20/2019.    Patient presents for follow-up with complaints of worsening dyspnea and bilateral lower leg edema over the last 2 weeks.  Advised patient to take furosemide 20 mg once daily for the next 3 days, if symptoms worsen or fail to improve patient will increase to furosemide 40 mg once daily for an additional 4 days.  Will obtain repeat BMP in 1 week.  We will also obtain BNP.  We will schedule for echocardiogram to further evaluate symptoms, and as she has not had echo done since October 2020.  In regard to hypertension, remains well controlled.  We will continue current antihypertensive medications.  Again counseled patient regarding low-salt diet and weight loss.  Patient is currently enrolled in our offices remote monitoring program.  Counseled patient regarding signs and symptoms that would warrant sooner/urgent evaluation, she verbalized understanding and agreement.  Follow-up June 08, 2020 with Dr. Einar Gip as previously scheduled.  Patient will notify the office if symptoms worsen or fail to improve, and she will be seen sooner.   Alethia Berthold, PA-C 04/20/2020, 12:28 PM Office: [phone]

## 2020-04-21 ENCOUNTER — Encounter: Payer: Self-pay | Admitting: Vascular Surgery

## 2020-04-21 ENCOUNTER — Ambulatory Visit (HOSPITAL_COMMUNITY)
Admission: RE | Admit: 2020-04-21 | Discharge: 2020-04-21 | Disposition: A | Discharge status: Home / Self Care | Payer: Medicare Other | Source: Ambulatory Visit | Attending: Vascular Surgery | Admitting: Vascular Surgery

## 2020-04-21 ENCOUNTER — Telehealth: Payer: Self-pay

## 2020-04-21 ENCOUNTER — Ambulatory Visit (INDEPENDENT_AMBULATORY_CARE_PROVIDER_SITE_OTHER): Payer: Medicare Other | Admitting: Vascular Surgery

## 2020-04-21 ENCOUNTER — Other Ambulatory Visit: Payer: Self-pay

## 2020-04-21 VITALS — BP 137/74 | HR 60 | Temp 98.0°F | Resp 16 | Ht 64.0 in | Wt 206.0 lb

## 2020-04-21 DIAGNOSIS — I714 Abdominal aortic aneurysm, without rupture, unspecified: Secondary | ICD-10-CM

## 2020-04-21 MED ORDER — HYDRALAZINE HCL 25 MG PO TABS: ORAL_TABLET | ORAL | 0 refills | Status: DC

## 2020-04-21 MED ORDER — ISOSORBIDE DINITRATE 20 MG PO TABS
20.0000 mg | ORAL_TABLET | Freq: Three times a day (TID) | ORAL | 0 refills | Status: DC
Start: 2020-04-21 — End: 2020-06-08

## 2020-04-21 NOTE — Telephone Encounter (Signed)
Yes it is fine to split the script. I believe Dr. Einar Gip has done this in the past, so she may already have prescriptions for the following: Isosorbide dinitrate 20 mg one tablet TID and hydralazine 25 mg 1.5 tablets TID.

## 2020-04-21 NOTE — Progress Notes (Signed)
Patient name: Lisa Mcclure MRN: [id number] DOB: 03/11/1933 Sex: female  REASON FOR VISIT:.  Follow-up after EVAR for symptomatic aneurysm  HPI: Lisa Mcclure is a 87 y.o. female with multiple medical problems as noted below that presents for hospital follow-up after endovascular stent graft for treatment of a symptomatic 5.7 cm abdominal aortic aneurysm.  I initially met her in the ED when she had new onset abdominal pain that we felt was related to her enlarging AAA.  Her aneurysm was repaired on 12/20/2019 with stent graft.  Unfortunately we have not seen her in follow-up until today.  She did get a CTA on 04/14/2020 that shows good seal but she does have evidence of a type II endoleak but otherwise aneurysm has shown no signs of growth and is actually slightly smaller.  She feels she is doing well.  No abdominal or back pain.  Her main concern is her arthritis.  Past Medical History:  Diagnosis Date  . Anemia    h/o hemorrhoidal bleeding and blood transfusion  . Cardiomegaly   . Chronic diastolic CHF (congestive heart failure) ([place])   . Depression   . Diverticulosis   . DOE (dyspnea on exertion)   . Esophageal reflux   . GERD (gastroesophageal reflux disease)   . Hypothyroidism   . Insomnia   . LBP (low back pain)   . OAB (overactive bladder)   . Obesity   . OSA (obstructive sleep apnea)   . Osteoarthritis   . Rheumatoid arthritis(714.0)   . Shoulder pain, bilateral   . Unspecified essential hypertension   . Venous insufficiency     Past Surgical History:  Procedure Laterality Date  . ABDOMINAL AORTIC ENDOVASCULAR STENT GRAFT N/A 12/20/2019   Procedure: Aortogram including catheter selection of aorta and bilateral iliac arteriogram, Endovascular repair of infrarenal abdominal aortic aneurysm with bifurcated stent graft (26 mm x 14 x 12 main body, right bell bottom with a 20 mm x 10 cm piece, and left bell bottom with a 16 mm x 12 cm piece) ;  Surgeon: Marty Heck, MD;  Location: [facility];  Service: Vascular;  Laterality: N/A;  . APPENDECTOMY  1953  . bladder abduction-1996  1996  . breast biopsy Right 1980  . CARDIOVERSION N/A 12/24/2019   Procedure: CARDIOVERSION;  Surgeon: Adrian Prows, MD;  Location: [facility] ENDOSCOPY;  Service: Cardiovascular;  Laterality: N/A;  . [place]  . COSMETIC SURGERY  1996  . CYSTOCELE REPAIR    . FLEXIBLE SIGMOIDOSCOPY N/A 04/10/2015   Procedure: FLEXIBLE SIGMOIDOSCOPY;  Surgeon: Arta Silence, MD;  Location: [facility] ENDOSCOPY;  Service: Endoscopy;  Laterality: N/A;  . knee arthroscopy Right 1996, 2010  . KNEE ARTHROSCOPY W/ AUTOGENOUS CARTILAGE IMPLANTATION (ACI) PROCEDURE Left 1994, 1995  . REFRACTIVE SURGERY  01/2020  . SHOULDER SURGERY  1990  . TEE WITHOUT CARDIOVERSION N/A 12/24/2019   Procedure: TRANSESOPHAGEAL ECHOCARDIOGRAM (TEE);  Surgeon: Adrian Prows, MD;  Location: [facility];  Service: Cardiovascular;  Laterality: N/A;  . TOTAL ABDOMINAL HYSTERECTOMY  1972  . ULTRASOUND GUIDANCE FOR VASCULAR ACCESS Bilateral 12/20/2019   Procedure: Ultrasound-guided access of bilateral common femoral arteries for delivery of endograft and percutaneous closure;  Surgeon: Marty Heck, MD;  Location: [facility] OR;  Service: Vascular;  Laterality: Bilateral;  . VESICOVAGINAL FISTULA CLOSURE W/ TAH    . WRIST SURGERY  1967    Family History  Problem Relation Age of Onset  . COPD Father   .  Lung cancer Mother   . Heart attack Maternal Grandmother 80  . Breast cancer Maternal Aunt   . Lung cancer Son     SOCIAL HISTORY: Social History   Tobacco Use  . Smoking status: Former Smoker    Packs/day: 0.20    Years: 10.00    Pack years: 2.00    Types: Cigarettes    Quit date: 04/11/1984    Years since quitting: 36.0  . Smokeless tobacco: Never Used  Substance Use Topics  . Alcohol use: Yes    Alcohol/week: 0.0 standard drinks    Comment: cocktail once a week    Allergies  Allergen Reactions  .  Statins Other (See Comments)    Muscle aches and INTERNAL BLEEDING  . Gabapentin Other (See Comments)    "Made me loopy"  . Methocarbamol Other (See Comments)    "Made me loopy"    Current Outpatient Medications  Medication Sig Dispense Refill  . amiodarone (PACERONE) 100 MG tablet Take 1 tablet (100 mg total) by mouth daily. (Patient taking differently: Take 100 mg by mouth daily. Taking it in the morning) 90 tablet 3  . amLODipine (NORVASC) 10 MG tablet Take 1 tablet (10 mg total) by mouth daily. (Patient taking differently: Take 5 mg by mouth daily.) 90 tablet 1  . apixaban (ELIQUIS) 5 MG TABS tablet Take 1 tablet (5 mg total) by mouth 2 (two) times daily. 60 tablet 1  . aspirin EC 81 MG EC tablet Take 1 tablet (81 mg total) by mouth daily at 6 (six) AM. Swallow whole. (Patient taking differently: Take 81 mg by mouth daily at 6 (six) AM. Swallow whole. Taking it at lunch time) 30 tablet 11  . Bempedoic Acid-Ezetimibe (NEXLIZET) 180-10 MG TABS Take 1 capsule by mouth daily. (Patient taking differently: Take 1 capsule by mouth daily. Taking it at bedtime) 30 tablet 3  . BIDIL 20-37.5 MG tablet TAKE 1 TABLET BY MOUTH THREE TIMES DAILY 30 tablet 3  . carvedilol (COREG) 25 MG tablet Take 0.5 tablets (12.5 mg total) by mouth 2 (two) times daily. 90 tablet 3  . Cholecalciferol (VITAMIN D3) 125 MCG (5000 UT) CAPS Take 5,000 Units by mouth daily with breakfast. Taking it at lunchtime    . diclofenac sodium (VOLTAREN) 1 % GEL Apply 2.25 g topically 3 (three) times daily as needed (for pain).     . furosemide (LASIX) 20 MG tablet Take 1 tablet (20 mg total) by mouth daily. 90 tablet 3  . hydrochlorothiazide (MICROZIDE) 12.5 MG capsule Take 1 capsule (12.5 mg total) by mouth daily. (Patient taking differently: Take 12.5 mg by mouth daily. Taking it in the morning) 90 capsule 3  . HYDROcodone-acetaminophen (NORCO) 7.5-325 MG tablet Take 1 tablet by mouth every 6 (six) hours as needed for moderate pain. 20  tablet 0  . latanoprost (XALATAN) 0.005 % ophthalmic solution Place 1 drop into both eyes at bedtime.    Marland Kitchen levothyroxine (SYNTHROID) 125 MCG tablet Take 125 mcg by mouth daily before breakfast.     . losartan (COZAAR) 100 MG tablet TAKE 1 TABLET(100 MG) BY MOUTH TWICE DAILY (Patient taking differently: Take 100 mg by mouth daily. Taking in the afternoon) 180 tablet 1  . Multiple Vitamins-Minerals (PRESERVISION AREDS 2+MULTI VIT PO) Take 1 tablet by mouth in the morning and at bedtime.     Marland Kitchen omeprazole (PRILOSEC) 20 MG capsule Take 1 capsule (20 mg total) by mouth daily. 30 capsule 11  . rOPINIRole (REQUIP) 1 MG  tablet Take 1 mg by mouth See admin instructions. Take 1 mg by mouth once a day between 3 PM-4 PM and an additional 1 mg in the evening, if no relief  12  . busPIRone (BUSPAR) 7.5 MG tablet Take 7.5 mg by mouth as needed. (Patient not taking: No sig reported)     No current facility-administered medications for this visit.    REVIEW OF SYSTEMS:  [X]  denotes positive finding, [ ]  denotes negative finding Cardiac  Comments:  Chest pain or chest pressure:    Shortness of breath upon exertion:    Short of breath when lying flat:    Irregular heart rhythm:        Vascular    Pain in calf, thigh, or hip brought on by ambulation:    Pain in feet at night that wakes you up from your sleep:     Blood clot in your veins:    Leg swelling:         Pulmonary    Oxygen at home:    Productive cough:     Wheezing:         Neurologic    Sudden weakness in arms or legs:     Sudden numbness in arms or legs:     Sudden onset of difficulty speaking or slurred speech:    Temporary loss of vision in one eye:     Problems with dizziness:         Gastrointestinal    Blood in stool:     Vomited blood:         Genitourinary    Burning when urinating:     Blood in urine:        Psychiatric    Major depression:         Hematologic    Bleeding problems:    Problems with blood clotting too  easily:        Skin    Rashes or ulcers:        Constitutional    Fever or chills:      PHYSICAL EXAM: Vitals:   04/21/20 0838  BP: 137/74  Pulse: 60  Resp: 16  Temp: 98 F (36.7 C)  TempSrc: Temporal  SpO2: 93%  Weight: 206 lb (93.4 kg)  Height: 5' 4"  (1.626 m)    GENERAL: The patient is a well-nourished female, in no acute distress. The vital signs are documented above. CARDIAC: There is a regular rate and rhythm.  VASCULAR:  Palpable femoral pulses bilaterally Palpable PT pulses bilaterally ABDOMEN: soft, NT, ND.  DATA:   CTA abdomen pelvis from 04/14/2020 on my review shows infrarenal stent graft in good position with good seal proximally and distally.  She does have a type II endoleak likely from a lumbar.  I do not see any evidence of aneurysm growth.  Assessment/Plan:  87 year old female presents for follow-up and ongoing surveillance after endovascular stent graft of her infrarenal abdominal aorta for a 5.7 cm symptomatic AAA on 12/20/2019.  Ultimately she is doing very well and her stent graft is in good position and has good seal as demonstrated on CT.  We did discuss the type II endoleak but given no aneurysm growth this does not require any intervention and just surveillance.  I will see her in 6 months with a EVAR duplex here in the office given the type II endoleak.  I did discuss with her the 3.8 cm left ovarian cyst noted on CT.  I asked  her if she had a OB/GYN that could follow this and she does not.  I offered to make a referral today.   She would like to discuss with her PCP and feels she does not want another doctor at age 63.   Marty Heck, MD [facility] Office: [phone]

## 2020-04-21 NOTE — Telephone Encounter (Signed)
Pharmacy called regarding patient's bidil it is in back order is it okay to go ahead and split rx we currently don't have samples to give patient please advise

## 2020-04-21 NOTE — Telephone Encounter (Signed)
Done

## 2020-04-22 ENCOUNTER — Other Ambulatory Visit: Payer: Self-pay

## 2020-04-22 DIAGNOSIS — I714 Abdominal aortic aneurysm, without rupture, unspecified: Secondary | ICD-10-CM

## 2020-04-23 DIAGNOSIS — Z8616 Personal history of COVID-19: Secondary | ICD-10-CM | POA: Diagnosis not present

## 2020-04-23 DIAGNOSIS — K219 Gastro-esophageal reflux disease without esophagitis: Secondary | ICD-10-CM | POA: Diagnosis not present

## 2020-04-23 DIAGNOSIS — I4891 Unspecified atrial fibrillation: Secondary | ICD-10-CM | POA: Diagnosis not present

## 2020-04-23 DIAGNOSIS — I350 Nonrheumatic aortic (valve) stenosis: Secondary | ICD-10-CM | POA: Diagnosis not present

## 2020-04-23 DIAGNOSIS — D649 Anemia, unspecified: Secondary | ICD-10-CM | POA: Diagnosis not present

## 2020-04-23 DIAGNOSIS — R5383 Other fatigue: Secondary | ICD-10-CM | POA: Diagnosis not present

## 2020-04-23 DIAGNOSIS — I110 Hypertensive heart disease with heart failure: Secondary | ICD-10-CM | POA: Diagnosis not present

## 2020-04-23 DIAGNOSIS — R269 Unspecified abnormalities of gait and mobility: Secondary | ICD-10-CM | POA: Diagnosis not present

## 2020-04-23 DIAGNOSIS — I5032 Chronic diastolic (congestive) heart failure: Secondary | ICD-10-CM | POA: Diagnosis not present

## 2020-04-24 DIAGNOSIS — I350 Nonrheumatic aortic (valve) stenosis: Secondary | ICD-10-CM | POA: Diagnosis not present

## 2020-04-24 DIAGNOSIS — I4891 Unspecified atrial fibrillation: Secondary | ICD-10-CM | POA: Diagnosis not present

## 2020-04-24 DIAGNOSIS — I110 Hypertensive heart disease with heart failure: Secondary | ICD-10-CM | POA: Diagnosis not present

## 2020-04-24 DIAGNOSIS — I5032 Chronic diastolic (congestive) heart failure: Secondary | ICD-10-CM | POA: Diagnosis not present

## 2020-04-24 DIAGNOSIS — K219 Gastro-esophageal reflux disease without esophagitis: Secondary | ICD-10-CM | POA: Diagnosis not present

## 2020-04-24 DIAGNOSIS — D649 Anemia, unspecified: Secondary | ICD-10-CM | POA: Diagnosis not present

## 2020-04-30 DIAGNOSIS — I5032 Chronic diastolic (congestive) heart failure: Secondary | ICD-10-CM | POA: Diagnosis not present

## 2020-04-30 DIAGNOSIS — R0600 Dyspnea, unspecified: Secondary | ICD-10-CM | POA: Diagnosis not present

## 2020-05-01 DIAGNOSIS — K219 Gastro-esophageal reflux disease without esophagitis: Secondary | ICD-10-CM | POA: Diagnosis not present

## 2020-05-01 DIAGNOSIS — I350 Nonrheumatic aortic (valve) stenosis: Secondary | ICD-10-CM | POA: Diagnosis not present

## 2020-05-01 DIAGNOSIS — I110 Hypertensive heart disease with heart failure: Secondary | ICD-10-CM | POA: Diagnosis not present

## 2020-05-01 DIAGNOSIS — D649 Anemia, unspecified: Secondary | ICD-10-CM | POA: Diagnosis not present

## 2020-05-01 DIAGNOSIS — I4891 Unspecified atrial fibrillation: Secondary | ICD-10-CM | POA: Diagnosis not present

## 2020-05-01 DIAGNOSIS — I5032 Chronic diastolic (congestive) heart failure: Secondary | ICD-10-CM | POA: Diagnosis not present

## 2020-05-01 LAB — BASIC METABOLIC PANEL
BUN/Creatinine Ratio: 41 — ABNORMAL HIGH (ref 12–28)
BUN: 56 mg/dL — ABNORMAL HIGH (ref 8–27)
CO2: 27 mmol/L (ref 20–29)
Calcium: 9 mg/dL (ref 8.7–10.3)
Chloride: 96 mmol/L (ref 96–106)
Creatinine, Ser: 1.35 mg/dL — ABNORMAL HIGH (ref 0.57–1.00)
GFR calc Af Amer: 41 mL/min/{1.73_m2} — ABNORMAL LOW (ref >59)
GFR calc non Af Amer: 36 mL/min/{1.73_m2} — ABNORMAL LOW (ref >59)
Glucose: 91 mg/dL (ref 65–99)
Potassium: 4.4 mmol/L (ref 3.5–5.2)
Sodium: 137 mmol/L (ref 134–144)

## 2020-05-01 LAB — BRAIN NATRIURETIC PEPTIDE: BNP: 337.7 pg/mL — ABNORMAL HIGH (ref 0.0–100.0)

## 2020-05-04 ENCOUNTER — Telehealth: Payer: Self-pay | Admitting: Student

## 2020-05-04 DIAGNOSIS — R0609 Other forms of dyspnea: Secondary | ICD-10-CM

## 2020-05-04 DIAGNOSIS — R0600 Dyspnea, unspecified: Secondary | ICD-10-CM

## 2020-05-04 DIAGNOSIS — I110 Hypertensive heart disease with heart failure: Secondary | ICD-10-CM | POA: Diagnosis not present

## 2020-05-04 DIAGNOSIS — I350 Nonrheumatic aortic (valve) stenosis: Secondary | ICD-10-CM | POA: Diagnosis not present

## 2020-05-04 DIAGNOSIS — R0602 Shortness of breath: Secondary | ICD-10-CM

## 2020-05-04 DIAGNOSIS — I5032 Chronic diastolic (congestive) heart failure: Secondary | ICD-10-CM | POA: Diagnosis not present

## 2020-05-04 DIAGNOSIS — I10 Essential (primary) hypertension: Secondary | ICD-10-CM

## 2020-05-04 DIAGNOSIS — I4891 Unspecified atrial fibrillation: Secondary | ICD-10-CM | POA: Diagnosis not present

## 2020-05-04 DIAGNOSIS — D649 Anemia, unspecified: Secondary | ICD-10-CM | POA: Diagnosis not present

## 2020-05-04 DIAGNOSIS — Z8639 Personal history of other endocrine, nutritional and metabolic disease: Secondary | ICD-10-CM

## 2020-05-04 DIAGNOSIS — K219 Gastro-esophageal reflux disease without esophagitis: Secondary | ICD-10-CM | POA: Diagnosis not present

## 2020-05-04 NOTE — Telephone Encounter (Signed)
Patient requested TSH be added to upcoming labs due to hair loss.

## 2020-05-04 NOTE — Telephone Encounter (Signed)
Patient took Lasix for 4 days and dyspnea has resolved and leg swelling has improved. Reviewed labs with patient. Renal function worsened. Advised patient to stop lasix and will repeat BMP in 2 weeks. Patient will notify our office if she has recurrence of dyspnea, leg swelling, or weight trending up. Patient verbalized understanding and agreement.

## 2020-05-04 NOTE — Progress Notes (Signed)
Reviewed with patient. See scanned lab report

## 2020-05-04 NOTE — Progress Notes (Signed)
I reviewed labs and spoke to patient. Renal function worsened. Advised her to stop taking daily Lasix and will repeat BMP in 2 weeks.

## 2020-05-04 NOTE — Telephone Encounter (Signed)
Order placed for repeat BMP in 2 weeks.

## 2020-05-05 ENCOUNTER — Telehealth: Payer: Self-pay | Admitting: Student

## 2020-05-05 NOTE — Telephone Encounter (Signed)
Patient requested I call her to discuss symptom of fatigue. Called and left a message for patient to call the office back.

## 2020-05-05 NOTE — Telephone Encounter (Signed)
Spoke with patient. She reports increased fatigue. Advised her that we will proceed with checking TSH and obtaining echocardiogram.

## 2020-05-07 DIAGNOSIS — H353221 Exudative age-related macular degeneration, left eye, with active choroidal neovascularization: Secondary | ICD-10-CM | POA: Diagnosis not present

## 2020-05-07 DIAGNOSIS — H353114 Nonexudative age-related macular degeneration, right eye, advanced atrophic with subfoveal involvement: Secondary | ICD-10-CM | POA: Diagnosis not present

## 2020-05-07 DIAGNOSIS — H35033 Hypertensive retinopathy, bilateral: Secondary | ICD-10-CM | POA: Diagnosis not present

## 2020-05-07 DIAGNOSIS — H43813 Vitreous degeneration, bilateral: Secondary | ICD-10-CM | POA: Diagnosis not present

## 2020-05-11 DIAGNOSIS — J449 Chronic obstructive pulmonary disease, unspecified: Secondary | ICD-10-CM | POA: Diagnosis not present

## 2020-05-11 DIAGNOSIS — E039 Hypothyroidism, unspecified: Secondary | ICD-10-CM | POA: Diagnosis not present

## 2020-05-18 ENCOUNTER — Other Ambulatory Visit: Payer: Medicare Other

## 2020-05-18 ENCOUNTER — Other Ambulatory Visit: Payer: Self-pay | Admitting: Cardiology

## 2020-05-26 DIAGNOSIS — R262 Difficulty in walking, not elsewhere classified: Secondary | ICD-10-CM | POA: Diagnosis not present

## 2020-05-26 DIAGNOSIS — M6281 Muscle weakness (generalized): Secondary | ICD-10-CM | POA: Diagnosis not present

## 2020-05-27 ENCOUNTER — Ambulatory Visit: Payer: Medicare Other

## 2020-05-27 ENCOUNTER — Other Ambulatory Visit: Payer: Self-pay

## 2020-05-27 DIAGNOSIS — R0609 Other forms of dyspnea: Secondary | ICD-10-CM | POA: Diagnosis not present

## 2020-05-27 DIAGNOSIS — R0600 Dyspnea, unspecified: Secondary | ICD-10-CM

## 2020-05-27 DIAGNOSIS — I5032 Chronic diastolic (congestive) heart failure: Secondary | ICD-10-CM

## 2020-05-28 DIAGNOSIS — D0439 Carcinoma in situ of skin of other parts of face: Secondary | ICD-10-CM | POA: Diagnosis not present

## 2020-05-28 DIAGNOSIS — L570 Actinic keratosis: Secondary | ICD-10-CM | POA: Diagnosis not present

## 2020-05-28 DIAGNOSIS — L821 Other seborrheic keratosis: Secondary | ICD-10-CM | POA: Diagnosis not present

## 2020-06-01 DIAGNOSIS — M6281 Muscle weakness (generalized): Secondary | ICD-10-CM | POA: Diagnosis not present

## 2020-06-01 DIAGNOSIS — R262 Difficulty in walking, not elsewhere classified: Secondary | ICD-10-CM | POA: Diagnosis not present

## 2020-06-01 NOTE — Progress Notes (Signed)
Called pt to inform her about her echo. Pt understood

## 2020-06-01 NOTE — Progress Notes (Signed)
Please inform patient her echocardiogram is relatively stable compared to previous and October 2020.  Valvular disease and diastolic dysfunction (or muscle thickening) are stable.

## 2020-06-08 ENCOUNTER — Other Ambulatory Visit: Payer: Self-pay

## 2020-06-08 ENCOUNTER — Encounter: Payer: Self-pay | Admitting: Cardiology

## 2020-06-08 ENCOUNTER — Ambulatory Visit: Payer: Medicare Other | Admitting: Cardiology

## 2020-06-08 VITALS — BP 162/78 | HR 58 | Temp 97.9°F | Resp 17 | Ht 64.0 in | Wt 213.2 lb

## 2020-06-08 DIAGNOSIS — I5032 Chronic diastolic (congestive) heart failure: Secondary | ICD-10-CM

## 2020-06-08 DIAGNOSIS — R0609 Other forms of dyspnea: Secondary | ICD-10-CM | POA: Diagnosis not present

## 2020-06-08 DIAGNOSIS — I480 Paroxysmal atrial fibrillation: Secondary | ICD-10-CM | POA: Diagnosis not present

## 2020-06-08 DIAGNOSIS — R0600 Dyspnea, unspecified: Secondary | ICD-10-CM

## 2020-06-08 DIAGNOSIS — I10 Essential (primary) hypertension: Secondary | ICD-10-CM | POA: Diagnosis not present

## 2020-06-08 MED ORDER — FUROSEMIDE 20 MG PO TABS: 20.0000 mg | ORAL_TABLET | Freq: Two times a day (BID) | ORAL | 3 refills | Status: DC | PRN

## 2020-06-08 NOTE — Progress Notes (Signed)
Primary Physician/Referring:  [name], MD  [name], female    DOB: 1932/10/01, 87 y.o.   MRN: [id number]  No chief complaint on file.  HPI:    Lisa Mcclure  is a 87 y.o.  female  with past medical history of obesity, chronic diastolic CHF, hypertensive heart disease, paroxysmal atrial fibrillation, anemia, hemorrhoids, diverticulosis, esophageal reflux, depression, OSA intolerant to CPAP on nocturnal 02, rheumatoid arthritis, and venous insufficiency. CT scan of the chest 03/2016 without definitive ILD. LM and 2 vessel CAD, mild calcification on mitral and aortic valve. S/p AAA repair on 12/20/2019.  She had had difficulty in weight loss, difficulty in blood pressure control.  Her last episode of atrial fibrillation and acute diastolic heart failure was in August 2021.  She is here on a 6-week office visit, states that over the past 2 to 3 weeks she has been extremely short of breath to do you on activities of daily living.  She has noticed abdominal distention and leg edema as well.  No chest pain, no palpitations, no dizziness or syncope. She is enrolled in remote patient monitoring.  Past Medical History:  Diagnosis Date  . Anemia    h/o hemorrhoidal bleeding and blood transfusion  . Cardiomegaly   . Chronic diastolic CHF (congestive heart failure) ([place])   . Depression   . Diverticulosis   . DOE (dyspnea on exertion)   . Esophageal reflux   . GERD (gastroesophageal reflux disease)   . Hypothyroidism   . Insomnia   . LBP (low back pain)   . OAB (overactive bladder)   . Obesity   . OSA (obstructive sleep apnea)   . Osteoarthritis   . Rheumatoid arthritis(714.0)   . Shoulder pain, bilateral   . Unspecified essential hypertension   . Venous insufficiency    Past Surgical History:  Procedure Laterality Date  . ABDOMINAL AORTIC ENDOVASCULAR STENT GRAFT N/A 12/20/2019   Procedure: Aortogram including catheter selection of aorta and bilateral  iliac arteriogram, Endovascular repair of infrarenal abdominal aortic aneurysm with bifurcated stent graft (26 mm x 14 x 12 main body, right bell bottom with a 20 mm x 10 cm piece, and left bell bottom with a 16 mm x 12 cm piece) ;  Surgeon: Marty Heck, MD;  Location: [facility];  Service: Vascular;  Laterality: N/A;  . APPENDECTOMY  1953  . bladder abduction-1996  1996  . breast biopsy Right 1980  . CARDIOVERSION N/A 12/24/2019   Procedure: CARDIOVERSION;  Surgeon: Adrian Prows, MD;  Location: [facility] ENDOSCOPY;  Service: Cardiovascular;  Laterality: N/A;  . [place]  . COSMETIC SURGERY  1996  . CYSTOCELE REPAIR    . FLEXIBLE SIGMOIDOSCOPY N/A 04/10/2015   Procedure: FLEXIBLE SIGMOIDOSCOPY;  Surgeon: Arta Silence, MD;  Location: [facility] ENDOSCOPY;  Service: Endoscopy;  Laterality: N/A;  . knee arthroscopy Right 1996, 2010  . KNEE ARTHROSCOPY W/ AUTOGENOUS CARTILAGE IMPLANTATION (ACI) PROCEDURE Left 1994, 1995  . REFRACTIVE SURGERY  01/2020  . SHOULDER SURGERY  1990  . TEE WITHOUT CARDIOVERSION N/A 12/24/2019   Procedure: TRANSESOPHAGEAL ECHOCARDIOGRAM (TEE);  Surgeon: Adrian Prows, MD;  Location: [facility];  Service: Cardiovascular;  Laterality: N/A;  . TOTAL ABDOMINAL HYSTERECTOMY  1972  . ULTRASOUND GUIDANCE FOR VASCULAR ACCESS Bilateral 12/20/2019   Procedure: Ultrasound-guided access of bilateral common femoral arteries for delivery of endograft and percutaneous closure;  Surgeon: Marty Heck, MD;  Location: [facility];  Service:  Vascular;  Laterality: Bilateral;  . VESICOVAGINAL FISTULA CLOSURE W/ TAH    . WRIST SURGERY  1967   Family History  Problem Relation Age of Onset  . COPD Father   . Lung cancer Mother   . Heart attack Maternal Grandmother 80  . Breast cancer Maternal Aunt   . Lung cancer Son     Social History   Tobacco Use  . Smoking status: Former Smoker    Packs/day: 0.20    Years: 10.00    Pack years: 2.00    Types: Cigarettes     Quit date: 04/11/1984    Years since quitting: 36.1  . Smokeless tobacco: Never Used  Substance Use Topics  . Alcohol use: Yes    Alcohol/week: 0.0 standard drinks    Comment: cocktail once a week   Marital Status: Widowed  ROS  Review of Systems  Constitutional: Positive for malaise/fatigue (improving).  Cardiovascular: Positive for dyspnea on exertion (acute on chronic). Negative for chest pain, leg swelling, orthopnea, palpitations, paroxysmal nocturnal dyspnea and syncope.  Hematologic/Lymphatic: Does not bruise/bleed easily.  Musculoskeletal: Positive for back pain and joint pain (bilateral knees).  Gastrointestinal: Negative for melena.  Neurological: Positive for dizziness. Negative for headaches and light-headedness.   Objective  Blood pressure (!) 162/78, pulse (!) 58, temperature 97.9 F (36.6 C), temperature source Temporal, resp. rate 17, height 5' 4"  (1.626 m), weight 213 lb 3.2 oz (96.7 kg), SpO2 (!) 82 %.  Vitals with BMI 06/08/2020 06/08/2020 04/21/2020  Height - 5' 4"  5' 4"   Weight - 213 lbs 3 oz 206 lbs  BMI - 30.94 07.68  Systolic 088 110 315  Diastolic 78 78 74  Pulse 58 69 60     Physical Exam Vitals reviewed.  Constitutional:      General: She is not in acute distress.    Appearance: She is well-developed.  Cardiovascular:     Rate and Rhythm: Normal rate and regular rhythm.     Pulses: Intact distal pulses.          Carotid pulses are 2+ on the right side and 2+ on the left side.      Radial pulses are 2+ on the right side and 2+ on the left side.     Heart sounds: S1 normal and S2 normal. Murmur heard.   Harsh midsystolic murmur is present with a grade of 3/6 at the upper right sternal border radiating to the apex. No gallop.      Comments: 2+ bilateral ankle edema. No JVD.  Pulmonary:     Effort: Pulmonary effort is normal. No accessory muscle usage or respiratory distress.     Breath sounds: Wheezing (bilateral expiratory wheezing) and rhonchi  (bilateral bases) present.  Abdominal:     General: Bowel sounds are normal.     Palpations: Abdomen is soft.  Neurological:     Mental Status: She is alert.    Laboratory examination:   Recent Labs    12/24/19 0627 12/25/19 0339 04/30/20 1200  NA 134* 134* 137  K 4.2 3.9 4.4  CL 97* 97* 96  CO2 27 28 27   GLUCOSE 109* 100* 91  BUN 19 18 56*  CREATININE 0.89 0.87 1.35*  CALCIUM 8.4* 8.4* 9.0  GFRNONAA 60* >60 36*  GFRAA >60 >60 41*   CrCl cannot be calculated (Patient's most recent lab result is older than the maximum 21 days allowed.).  CMP Latest Ref Rng & Units 04/30/2020 12/25/2019 12/24/2019  Glucose 65 -  99 mg/dL 91 100(H) 109(H)  BUN 8 - 27 mg/dL 56(H) 18 19  Creatinine 0.57 - 1.00 mg/dL 1.35(H) 0.87 0.89  Sodium 134 - 144 mmol/L 137 134(L) 134(L)  Potassium 3.5 - 5.2 mmol/L 4.4 3.9 4.2  Chloride 96 - 106 mmol/L 96 97(L) 97(L)  CO2 20 - 29 mmol/L 27 28 27   Calcium 8.7 - 10.3 mg/dL 9.0 8.4(L) 8.4(L)  Total Protein 6.5 - 8.1 g/dL - - -  Total Bilirubin 0.3 - 1.2 mg/dL - - -  Alkaline Phos 38 - 126 U/L - - -  AST 15 - 41 U/L - - -  ALT 0 - 44 U/L - - -   CBC Latest Ref Rng & Units 12/25/2019 12/24/2019 12/23/2019  WBC 4.0 - 10.5 K/uL 8.8 10.5 12.4(H)  Hemoglobin 12.0 - 15.0 g/dL 9.1(L) 9.1(L) 9.9(L)  Hematocrit 36.0 - 46.0 % 30.7(L) 30.0(L) 32.5(L)  Platelets 150 - 400 K/uL 309 317 354   Lipid Panel    Component Value Date/Time   CHOL 126 03/14/2019 1121   TRIG 82 03/14/2019 1121   HDL 49 03/14/2019 1121   CHOLHDL 5.1 CALC 12/19/2006 0916   VLDL 26 12/19/2006 0916   LDLCALC 61 03/14/2019 1121   LDLDIRECT 159.2 12/19/2006 0916   HEMOGLOBIN A1C No results found for: HGBA1C, MPG TSH Recent Labs    12/23/19 1424  TSH 0.724   External labs:    Cholesterol, total 164.000 m 06/21/2019 HDL 46 MG/DL 06/21/2019 LDL 99.000 mg 06/21/2019 Triglycerides 93.000 06/21/2019  Lab 04/14/2020:  Serum glucose 86 mg, BUN 22, creatinine 1.13, EGFR 44 mL, sodium 138,  potassium 4.3.  CMP otherwise normal.  Hb 10.2/HCT 31.5, platelets 383.  02/17/2020:  Hemoglobin 11.1, hematocrit 34.2, platelets 383 BUN 26, creatinine 0.9, EGFR 59, sodium 136, potassium 4.6  01/07/2020: BUN 29, GFR 57, creatinine 0.93, BUN/CR 31, sodium 133, potassium 4.6, chloride 93, BMP otherwise normal  Cholesterol, total 164.000 m 06/21/2019 Triglycerides 93.000 06/21/2019 HDL 46 MG/DL 06/21/2019 LDL 99.000 mg 06/21/2019  Glucose Random 87.000 mg 06/21/2019 BUN 25.000 mg 06/21/2019 Creatinine, Serum 0.800 mg/ 06/21/2019  TSH 3.220 micr 06/21/2019  AST 18, ALT 11 09/14/2018  FOBT: Normal 04/24/2018  MicroAlbumin Urine 13.000 03/23/2017 MicroAlbumin/Creat 37.9 MG/ 03/23/2017  Medications and allergies   Allergies  Allergen Reactions  . Statins Other (See Comments)    Muscle aches and INTERNAL BLEEDING  . Gabapentin Other (See Comments)    "Made me loopy"  . Methocarbamol Other (See Comments)    "Made me loopy"    Current Outpatient Medications on File Prior to Visit  Medication Sig Dispense Refill  . amiodarone (PACERONE) 100 MG tablet Take 1 tablet (100 mg total) by mouth daily. (Patient taking differently: Take 100 mg by mouth daily. Taking it in the morning) 90 tablet 3  . amLODipine (NORVASC) 10 MG tablet TAKE ONE TABLET BY MOUTH ONCE DAILY 90 tablet 1  . apixaban (ELIQUIS) 5 MG TABS tablet Take 1 tablet (5 mg total) by mouth 2 (two) times daily. 60 tablet 1  . Bempedoic Acid-Ezetimibe (NEXLIZET) 180-10 MG TABS Take 1 capsule by mouth daily. (Patient taking differently: Take 1 capsule by mouth daily. Taking it at bedtime) 30 tablet 3  . busPIRone (BUSPAR) 7.5 MG tablet Take 7.5 mg by mouth as needed.    . carvedilol (COREG) 25 MG tablet Take 0.5 tablets (12.5 mg total) by mouth 2 (two) times daily. 90 tablet 3  . Cholecalciferol (VITAMIN D3) 125 MCG (5000 UT) CAPS Take  5,000 Units by mouth daily with breakfast. Taking it at lunchtime    . diclofenac sodium (VOLTAREN)  1 % GEL Apply 2.25 g topically 3 (three) times daily as needed (for pain).     Marland Kitchen HYDROcodone-acetaminophen (NORCO) 7.5-325 MG tablet Take 1 tablet by mouth every 6 (six) hours as needed for moderate pain. 20 tablet 0  . isosorbide-hydrALAZINE (BIDIL) 20-37.5 MG tablet Take 1 tablet by mouth 3 (three) times daily.    Marland Kitchen latanoprost (XALATAN) 0.005 % ophthalmic solution Place 1 drop into both eyes at bedtime.    Marland Kitchen levothyroxine (SYNTHROID) 125 MCG tablet Take 125 mcg by mouth daily before breakfast.     . losartan (COZAAR) 100 MG tablet TAKE 1 TABLET(100 MG) BY MOUTH TWICE DAILY (Patient taking differently: Take 100 mg by mouth daily. Taking in the afternoon) 180 tablet 1  . Multiple Vitamins-Minerals (PRESERVISION AREDS 2+MULTI VIT PO) Take 1 tablet by mouth in the morning and at bedtime.     Marland Kitchen omeprazole (PRILOSEC) 20 MG capsule Take 1 capsule (20 mg total) by mouth daily. 30 capsule 11  . rOPINIRole (REQUIP) 1 MG tablet Take 1 mg by mouth See admin instructions. Take 1 mg by mouth once a day between 3 PM-4 PM and an additional 1 mg in the evening, if no relief  12   No current facility-administered medications on file prior to visit.    Radiology:   CT Abdomen and Pelvis W Contrast 12/20/2019:  1. Large Infrarenal Abdominal Aortic Aneurysm measuring up to 5.7 cm with mild perianeurysmal inflammation. Differential considerations include impending AAA rupture, mycotic AAA, symptomatic AAA. Recommend urgent transfer to emergency department and Vascular Surgery consultation. Aortic aneurysm NOS (ICD10-I71.9). Aortic Atherosclerosis (ICD10-I70.0). 2. No other acute or inflammatory process identified. 5 cm fat containing left pelvic hernia. Large bowel diverticulosis. 3. Simple appearing 4 cm left ovarian cyst, recommend follow-up Ultrasound in 6-12 months  Chest X-ray 12/20/2019:   Diffuse cardiac enlargement. Emphysematous changes in the lungs. Coarse interstitial pattern to the lungs could  represent fibrosis or edema. No focal consolidation. No pleural effusions. No pneumothorax. Calcified and tortuous aorta. Degenerative changes in the shoulders and spine. Old rib fractures. Similar appearance to previous study. IMPRESSION: 1. Cardiac enlargement. 2. Coarse interstitial pattern to the lungs could represent fibrosis or edema.  Abdominal X-Ray 12/20/2019:  Interval placement of aorto iliac stent graft. Nonobstructive bowel gas pattern.  Cardiac Studies:   Lexiscan myoview stress test 12/08/2017: 1. Lexiscan stress test was performed. Exercise capacity was not assessed. Stress symptoms included abdominal pain. Blood pressure was normal. The resting and stress electrocardiogram demonstrated normal sinus rhythm, normal resting conduction, no resting arrhythmias and normal rest repolarization. 2. The overall quality of the study is good. There is no evidence of abnormal lung activity. Stress and rest SPECT images demonstrate homogeneous tracer distribution throughout the myocardium. Gated SPECT imaging reveals normal myocardial thickening and wall motion. The left ventricular ejection fraction was normal (54%). 3. Low risk study  Renal artery duplex 09/27/2017: Hemodynamically significant stenosis bilaterally. Renal length is within normal limits for both kidneys. There is an incidental 3.9x3.4x3.4 cm proximal and mid abdominal aortic aneurysm. Recommend dedicated abdominal aortic duplex scan to further define AAA.  Abdominal Aortic Duplex  01/01/2019: Moderate dilatation of the abdominal aorta is noted in the proximal, mid and distal aorta. An abdominal aortic aneurysm measuring 4.5 x 4.52 x 4.52 cm is seen.  Recheck in 6 months for stability of the aneurysm.  TEE 12/24/2019: 1.  Left ventricular ejection fraction, by estimation, is 50 to 55%. The left ventricle has low normal function. Left ventricular diastolic function could not be evaluated.  2. Right ventricular systolic  function is normal. The right ventricular size is normal.  3. Left atrial size was moderately dilated. No left atrial/left atrial appendage thrombus was detected. The LAA emptying velocity was 30 cm/s.  4. The mitral valve is normal in structure. Mild mitral valve regurgitation. No evidence of mitral stenosis.  5. The aortic valve is calcified. Aortic valve regurgitation is not visualized. Mild aortic valve stenosis. Aortic valve mean gradient measures 10.8 mmHg.  6. There is Moderate (Grade III) layered plaque involving the ascending, transverse and descending aorta.   Echocardiogram 05/27/2020: Left ventricle cavity is normal in size. Moderate concentric hypertrophy of the left ventricle. Normal global wall motion. Normal LV systolic function with EF 56%. Doppler evidence of grade II (pseudonormal) diastolic dysfunction, elevated LAP.  Left atrial cavity is mildly dilated. Trileaflet aortic valve with mild aortic valve leaflet calcification. Mild aortic valve stenosis. Vmax 2.6 m/sec, mean PG 16 mmHg, AVA 1.2 cm2 by continuity equation Mild mitral valve leaflet calcification. Mild (Grade I) mitral regurgitation. Mild tricuspid regurgitation. Estimated pulmonary artery systolic pressure 31 mmHg.  Compared to previous study on 01/25/2019, trivial change in aortic valve mean PG from 12 to 16 mmHg, PASP from 25 mmHg to 31 mmHg.   EKG    EKG 05/31/2020: Sinus rhythm with borderline first-degree AV block at rate of 61 bpm, left atrial enlargement, normal axis.  Poor R wave progression, probably normal variant but cannot exclude anteroseptal infarct old.  No evidence of ischemia, normal QT interval. No significant change from 01/02/2020.  Assessment     ICD-10-CM   1. Chronic diastolic (congestive) heart failure (HCC)  I50.32 furosemide (LASIX) 20 MG tablet    Basic metabolic panel    Brain natriuretic peptide    Brain natriuretic peptide    Basic metabolic panel  2. Paroxysmal atrial  fibrillation (HCC)  I48.0   3. Primary hypertension  I10 EKG 12-Lead    furosemide (LASIX) 20 MG tablet  4. Dyspnea on exertion  R06.00    This patients CHA2DS2-VASc Score 6 (CHF, HTN, vasc, age, F) and yearly risk of stroke 9.8%.   Recommendations:   Lisa Mcclure  is a 87 y.o.  female  with past medical history of obesity, chronic diastolic CHF, hypertensive heart disease, paroxysmal atrial fibrillation, anemia, hemorrhoids, diverticulosis, esophageal reflux, depression, OSA intolerant to CPAP on nocturnal 02, rheumatoid arthritis, and venous insufficiency. CT scan of the chest 03/2016 without definitive ILD. LM and 2 vessel CAD, mild calcification on mitral and aortic valve. S/p AAA repair on 12/20/2019.    She had had difficulty in weight loss, difficulty in blood pressure control.  Her last episode of atrial fibrillation and acute diastolic heart failure was in August 2021.She is here on a 6-week office visit, states that over the past 2 to 3 weeks she has been extremely short of breath to do you on activities of daily living.  Instructed patient to continue monitoring home blood pressure with our office's remote monitoring program.  I have discontinued aspirin as she is on Eliquis to reduce bleeding complications and presence of anemia.  She is presently in acute decompensated heart failure, had done well on furosemide but had developed acute renal insufficiency.  Hence at this point we will start her on furosemide 20 mg twice daily for 3 days followed by  once a day, she can switch it to every other day once weight is stable and dyspnea is improved.  I will obtain BMP and BNP today and again in 1 week and I would like to see her back in 2 weeks for follow-up of acute diastolic heart failure.  Blood pressure is elevated today and she is maintaining sinus rhythm, continue observation for now and hopefully decongesting her with diuretics will help in reducing her blood pressure.  Salt  restriction and fluid restriction discussed with the patient and her daughter at the bedside.  We also discussed regarding diet.  This was a 40-minute encounter in reviewing heart failure symptoms and signs, management of complex medical issues and discussion with her family, her daughter at the bedside.    Adrian Prows, MD, [facility] 06/08/2020, 5:44 PM Office: [phone] Pager: [phone]

## 2020-06-09 LAB — BASIC METABOLIC PANEL
BUN/Creatinine Ratio: 23 (ref 12–28)
BUN: 23 mg/dL (ref 8–27)
CO2: 28 mmol/L (ref 20–29)
Calcium: 8.9 mg/dL (ref 8.7–10.3)
Chloride: 94 mmol/L — ABNORMAL LOW (ref 96–106)
Creatinine, Ser: 0.98 mg/dL (ref 0.57–1.00)
Glucose: 92 mg/dL (ref 65–99)
Potassium: 5.1 mmol/L (ref 3.5–5.2)
Sodium: 138 mmol/L (ref 134–144)
eGFR: 57 mL/min/{1.73_m2} — ABNORMAL LOW (ref >59)

## 2020-06-09 LAB — BRAIN NATRIURETIC PEPTIDE: BNP: 606.7 pg/mL — ABNORMAL HIGH (ref 0.0–100.0)

## 2020-06-10 ENCOUNTER — Other Ambulatory Visit: Payer: Self-pay | Admitting: Cardiology

## 2020-06-10 DIAGNOSIS — I5032 Chronic diastolic (congestive) heart failure: Secondary | ICD-10-CM

## 2020-06-11 DIAGNOSIS — H53413 Scotoma involving central area, bilateral: Secondary | ICD-10-CM | POA: Diagnosis not present

## 2020-06-15 DIAGNOSIS — I5032 Chronic diastolic (congestive) heart failure: Secondary | ICD-10-CM | POA: Diagnosis not present

## 2020-06-16 ENCOUNTER — Other Ambulatory Visit: Payer: Self-pay

## 2020-06-16 DIAGNOSIS — I5032 Chronic diastolic (congestive) heart failure: Secondary | ICD-10-CM

## 2020-06-16 DIAGNOSIS — I10 Essential (primary) hypertension: Secondary | ICD-10-CM

## 2020-06-16 LAB — BRAIN NATRIURETIC PEPTIDE: BNP: 197.4 pg/mL — ABNORMAL HIGH (ref 0.0–100.0)

## 2020-06-16 LAB — BASIC METABOLIC PANEL
BUN/Creatinine Ratio: 26 (ref 12–28)
BUN: 38 mg/dL — ABNORMAL HIGH (ref 8–27)
CO2: 28 mmol/L (ref 20–29)
Calcium: 9.3 mg/dL (ref 8.7–10.3)
Chloride: 93 mmol/L — ABNORMAL LOW (ref 96–106)
Creatinine, Ser: 1.47 mg/dL — ABNORMAL HIGH (ref 0.57–1.00)
Glucose: 109 mg/dL — ABNORMAL HIGH (ref 65–99)
Potassium: 5 mmol/L (ref 3.5–5.2)
Sodium: 139 mmol/L (ref 134–144)
eGFR: 35 mL/min/{1.73_m2} — ABNORMAL LOW (ref >59)

## 2020-06-16 MED ORDER — FUROSEMIDE 20 MG PO TABS: 20.0000 mg | ORAL_TABLET | Freq: Two times a day (BID) | ORAL | 0 refills | Status: DC | PRN

## 2020-06-16 MED ORDER — LOSARTAN POTASSIUM 100 MG PO TABS: 100.0000 mg | ORAL_TABLET | Freq: Every day | ORAL | 1 refills | Status: DC

## 2020-06-17 ENCOUNTER — Telehealth: Payer: Self-pay

## 2020-06-17 DIAGNOSIS — H53413 Scotoma involving central area, bilateral: Secondary | ICD-10-CM | POA: Diagnosis not present

## 2020-06-17 NOTE — Telephone Encounter (Signed)
I sent a my chart message.  She has excellent improvement in heart failure.  Please advise her that she needs to continue to restrict her calories to the minimum.  Extremely important for her to lose weight.

## 2020-06-17 NOTE — Telephone Encounter (Signed)
Patient called and aslkd about her recent blood work and if she is going to continue on her lasix ,?

## 2020-06-22 ENCOUNTER — Encounter: Payer: Self-pay | Admitting: Student

## 2020-06-22 ENCOUNTER — Other Ambulatory Visit: Payer: Self-pay

## 2020-06-22 ENCOUNTER — Ambulatory Visit: Payer: Medicare Other | Admitting: Student

## 2020-06-22 VITALS — BP 117/53 | HR 50 | Temp 97.3°F | Ht 64.0 in | Wt 202.0 lb

## 2020-06-22 DIAGNOSIS — I10 Essential (primary) hypertension: Secondary | ICD-10-CM | POA: Diagnosis not present

## 2020-06-22 DIAGNOSIS — M25511 Pain in right shoulder: Secondary | ICD-10-CM

## 2020-06-22 DIAGNOSIS — G8929 Other chronic pain: Secondary | ICD-10-CM

## 2020-06-22 DIAGNOSIS — I5032 Chronic diastolic (congestive) heart failure: Secondary | ICD-10-CM | POA: Diagnosis not present

## 2020-06-22 DIAGNOSIS — M25512 Pain in left shoulder: Secondary | ICD-10-CM | POA: Diagnosis not present

## 2020-06-22 MED ORDER — FUROSEMIDE 20 MG PO TABS: 20.0000 mg | ORAL_TABLET | ORAL | 0 refills | Status: DC

## 2020-06-22 NOTE — Progress Notes (Signed)
Primary Physician/Referring:  [name], MD  [name], female    DOB: June 05, 1932, 87 y.o.   MRN: [id number]  Chief Complaint  Patient presents with  . Congestive Heart Failure  . Follow-up   HPI:    Lisa Mcclure  is a 87 y.o.  female  with past medical history of obesity, chronic diastolic CHF, hypertensive heart disease, paroxysmal atrial fibrillation, anemia, hemorrhoids, diverticulosis, esophageal reflux, depression, OSA intolerant to CPAP on nocturnal 02, rheumatoid arthritis, and venous insufficiency. CT scan of the chest 03/2016 without definitive ILD. LM and 2 vessel CAD, mild calcification on mitral and aortic valve. S/p AAA repair on 12/20/2019. She had had difficulty in weight loss, difficulty in blood pressure control.  Her last episode of atrial fibrillation and acute diastolic heart failure was in August 2021.  Patient presents for 2-week follow-up of acute on chronic diastolic heart failure.  At last visit patient's aspirin and hydrochlorothiazide were stopped.  She was started on furosemide 40 mg twice daily for 3 days and transition to furosemide 20 mg once daily.  Patient's weight has trended down 9 pounds since last visit and symptoms of shortness of breath and bilateral lower leg swelling have significantly improved.  She has not limited to activities of daily living without issue.  She continues to be enrolled in remote patient monitoring.  Denies chest pain, palpitations, dizziness, syncope, near syncope.  Patient is also very concerned regarding shoulder pain and arthritis which is currently limiting her ability to move and function daily.  She is inquiring about oral steroid therapy.  Past Medical History:  Diagnosis Date  . Anemia    h/o hemorrhoidal bleeding and blood transfusion  . Cardiomegaly   . Chronic diastolic CHF (congestive heart failure) ([place])   . Depression   . Diverticulosis   . DOE (dyspnea on exertion)   . Esophageal  reflux   . GERD (gastroesophageal reflux disease)   . Hypothyroidism   . Insomnia   . LBP (low back pain)   . OAB (overactive bladder)   . Obesity   . OSA (obstructive sleep apnea)   . Osteoarthritis   . Rheumatoid arthritis(714.0)   . Shoulder pain, bilateral   . Unspecified essential hypertension   . Venous insufficiency    Past Surgical History:  Procedure Laterality Date  . ABDOMINAL AORTIC ENDOVASCULAR STENT GRAFT N/A 12/20/2019   Procedure: Aortogram including catheter selection of aorta and bilateral iliac arteriogram, Endovascular repair of infrarenal abdominal aortic aneurysm with bifurcated stent graft (26 mm x 14 x 12 main body, right bell bottom with a 20 mm x 10 cm piece, and left bell bottom with a 16 mm x 12 cm piece) ;  Surgeon: Marty Heck, MD;  Location: [place];  Service: Vascular;  Laterality: N/A;  . APPENDECTOMY  1953  . bladder abduction-1996  1996  . breast biopsy Right 1980  . CARDIOVERSION N/A 12/24/2019   Procedure: CARDIOVERSION;  Surgeon: Adrian Prows, MD;  Location[facility] ENDOSCOPY;  Service: Cardiovascular;  Laterality: N/A;  . [employer]  . COSMETIC SURGERY  1996  . CYSTOCELE REPAIR    . FLEXIBLE SIGMOIDOSCOPY N/A 04/10/2015   Procedure: FLEXIBLE SIGMOIDOSCOPY;  Surgeon: Arta Silence, MD;  Location: [facility] ENDOSCOPY;  Service: Endoscopy;  Laterality: N/A;  . knee arthroscopy Right 1996, 2010  . KNEE ARTHROSCOPY W/ AUTOGENOUS CARTILAGE IMPLANTATION (ACI) PROCEDURE Left 1994, 1995  . REFRACTIVE SURGERY  01/2020  .  SHOULDER SURGERY  1990  . TEE WITHOUT CARDIOVERSION N/A 12/24/2019   Procedure: TRANSESOPHAGEAL ECHOCARDIOGRAM (TEE);  Surgeon: Adrian Prows, MD;  Location: [place];  Service: Cardiovascular;  Laterality: N/A;  . TOTAL ABDOMINAL HYSTERECTOMY  1972  . ULTRASOUND GUIDANCE FOR VASCULAR ACCESS Bilateral 12/20/2019   Procedure: Ultrasound-guided access of bilateral common femoral arteries for delivery of endograft and  percutaneous closure;  Surgeon: Marty Heck, MD;  Location: [facility] OR;  Service: Vascular;  Laterality: Bilateral;  . VESICOVAGINAL FISTULA CLOSURE W/ TAH    . WRIST SURGERY  1967   Family History  Problem Relation Age of Onset  . COPD Father   . Lung cancer Mother   . Heart attack Maternal Grandmother 80  . Breast cancer Maternal Aunt   . Lung cancer Son     Social History   Tobacco Use  . Smoking status: Former Smoker    Packs/day: 0.20    Years: 10.00    Pack years: 2.00    Types: Cigarettes    Quit date: 04/11/1984    Years since quitting: 36.2  . Smokeless tobacco: Never Used  Substance Use Topics  . Alcohol use: Yes    Alcohol/week: 0.0 standard drinks    Comment: cocktail once a week   Marital Status: Widowed  ROS  Review of Systems  Constitutional: Positive for malaise/fatigue (improving).  Cardiovascular: Positive for dyspnea on exertion (acute on chronic). Negative for chest pain, leg swelling, orthopnea, palpitations, paroxysmal nocturnal dyspnea and syncope.  Hematologic/Lymphatic: Does not bruise/bleed easily.  Musculoskeletal: Positive for back pain and joint pain (bilateral knees).  Gastrointestinal: Negative for melena.  Neurological: Negative for headaches and light-headedness.   Objective  Blood pressure (!) 117/53, pulse (!) 50, temperature (!) 97.3 F (36.3 C), height 5' 4"  (1.626 m), weight 202 lb (91.6 kg), SpO2 96 %.  Vitals with BMI 06/22/2020 06/08/2020 06/08/2020  Height 5' 4"  - 5' 4"   Weight 202 lbs - 213 lbs 3 oz  BMI 61.68 - 37.29  Systolic 021 115 520  Diastolic 53 78 78  Pulse 50 58 69     Physical Exam Vitals reviewed.  Constitutional:      General: She is not in acute distress.    Appearance: She is well-developed.  Cardiovascular:     Rate and Rhythm: Normal rate and regular rhythm.     Pulses: Intact distal pulses.          Carotid pulses are 2+ on the right side and 2+ on the left side.      Radial pulses are 2+ on the  right side and 2+ on the left side.     Heart sounds: S1 normal and S2 normal. Murmur heard.   Harsh midsystolic murmur is present with a grade of 3/6 at the upper right sternal border radiating to the apex. No gallop.      Comments: No JVD.  Pulmonary:     Effort: Pulmonary effort is normal. No accessory muscle usage or respiratory distress.     Breath sounds: Wheezing (bilateral expiratory wheezing) and rhonchi (bilateral bases) present.  Abdominal:     General: Bowel sounds are normal. There is no distension.     Palpations: Abdomen is soft.  Musculoskeletal:        General: Tenderness present.     Right lower leg: Edema (trace) present.     Left lower leg: Edema (trace) present.     Comments: Limited range of motion, bilateral shoulders  Skin:  General: Skin is warm and dry.  Neurological:     General: No focal deficit present.     Mental Status: She is alert and oriented to person, place, and time.    Laboratory examination:   Recent Labs    12/24/19 0627 12/25/19 0339 04/30/20 1200 06/08/20 1607 06/15/20 1443  NA 134* 134* 137 138 139  K 4.2 3.9 4.4 5.1 5.0  CL 97* 97* 96 94* 93*  CO2 27 28 27 28 28   GLUCOSE 109* 100* 91 92 109*  BUN 19 18 56* 23 38*  CREATININE 0.89 0.87 1.35* 0.98 1.47*  CALCIUM 8.4* 8.4* 9.0 8.9 9.3  GFRNONAA 60* >60 36*  --   --   GFRAA >60 >60 41*  --   --    estimated creatinine clearance is 30.7 mL/min (A) (by C-G formula based on SCr of 1.47 mg/dL (H)).  CMP Latest Ref Rng & Units 06/15/2020 06/08/2020 04/30/2020  Glucose 65 - 99 mg/dL 109(H) 92 91  BUN 8 - 27 mg/dL 38(H) 23 56(H)  Creatinine 0.57 - 1.00 mg/dL 1.47(H) 0.98 1.35(H)  Sodium 134 - 144 mmol/L 139 138 137  Potassium 3.5 - 5.2 mmol/L 5.0 5.1 4.4  Chloride 96 - 106 mmol/L 93(L) 94(L) 96  CO2 20 - 29 mmol/L 28 28 27   Calcium 8.7 - 10.3 mg/dL 9.3 8.9 9.0  Total Protein 6.5 - 8.1 g/dL - - -  Total Bilirubin 0.3 - 1.2 mg/dL - - -  Alkaline Phos 38 - 126 U/L - - -  AST 15 - 41  U/L - - -  ALT 0 - 44 U/L - - -   CBC Latest Ref Rng & Units 12/25/2019 12/24/2019 12/23/2019  WBC 4.0 - 10.5 K/uL 8.8 10.5 12.4(H)  Hemoglobin 12.0 - 15.0 g/dL 9.1(L) 9.1(L) 9.9(L)  Hematocrit 36.0 - 46.0 % 30.7(L) 30.0(L) 32.5(L)  Platelets 150 - 400 K/uL 309 317 354   Lipid Panel    Component Value Date/Time   CHOL 126 03/14/2019 1121   TRIG 82 03/14/2019 1121   HDL 49 03/14/2019 1121   CHOLHDL 5.1 CALC 12/19/2006 0916   VLDL 26 12/19/2006 0916   LDLCALC 61 03/14/2019 1121   LDLDIRECT 159.2 12/19/2006 0916   HEMOGLOBIN A1C No results found for: HGBA1C, MPG TSH Recent Labs    12/23/19 1424  TSH 0.724   External labs:    Cholesterol, total 164.000 m 06/21/2019 HDL 46 MG/DL 06/21/2019 LDL 99.000 mg 06/21/2019 Triglycerides 93.000 06/21/2019  Lab 04/14/2020:  Serum glucose 86 mg, BUN 22, creatinine 1.13, EGFR 44 mL, sodium 138, potassium 4.3.  CMP otherwise normal.  Hb 10.2/HCT 31.5, platelets 383.  02/17/2020:  Hemoglobin 11.1, hematocrit 34.2, platelets 383 BUN 26, creatinine 0.9, EGFR 59, sodium 136, potassium 4.6  01/07/2020: BUN 29, GFR 57, creatinine 0.93, BUN/CR 31, sodium 133, potassium 4.6, chloride 93, BMP otherwise normal  Cholesterol, total 164.000 m 06/21/2019 Triglycerides 93.000 06/21/2019 HDL 46 MG/DL 06/21/2019 LDL 99.000 mg 06/21/2019  Glucose Random 87.000 mg 06/21/2019 BUN 25.000 mg 06/21/2019 Creatinine, Serum 0.800 mg/ 06/21/2019  TSH 3.220 micr 06/21/2019  AST 18, ALT 11 09/14/2018  FOBT: Normal 04/24/2018  MicroAlbumin Urine 13.000 03/23/2017 MicroAlbumin/Creat 37.9 MG/ 03/23/2017  Medications and allergies   Allergies  Allergen Reactions  . Statins Other (See Comments)    Muscle aches and INTERNAL BLEEDING  . Gabapentin Other (See Comments)    "Made me loopy"  . Methocarbamol Other (See Comments)    "Made me loopy"  Current Outpatient Medications on File Prior to Visit  Medication Sig Dispense Refill  . amiodarone (PACERONE) 100  MG tablet Take 1 tablet (100 mg total) by mouth daily. (Patient taking differently: Take 100 mg by mouth daily. Taking it in the morning) 90 tablet 3  . amLODipine (NORVASC) 10 MG tablet TAKE ONE TABLET BY MOUTH ONCE DAILY 90 tablet 1  . apixaban (ELIQUIS) 5 MG TABS tablet Take 1 tablet (5 mg total) by mouth 2 (two) times daily. 60 tablet 1  . Bempedoic Acid-Ezetimibe (NEXLIZET) 180-10 MG TABS Take 1 capsule by mouth daily. (Patient taking differently: Take 1 capsule by mouth daily. Taking it at bedtime) 30 tablet 3  . busPIRone (BUSPAR) 7.5 MG tablet Take 7.5 mg by mouth as needed.    . carvedilol (COREG) 12.5 MG tablet Take 12.5 mg by mouth 2 (two) times daily with a meal.    . Cholecalciferol (VITAMIN D3) 125 MCG (5000 UT) CAPS Take 5,000 Units by mouth daily with breakfast. Taking it at lunchtime    . diclofenac sodium (VOLTAREN) 1 % GEL Apply 2.25 g topically 3 (three) times daily as needed (for pain).     Marland Kitchen HYDROcodone-acetaminophen (NORCO) 7.5-325 MG tablet Take 1 tablet by mouth every 6 (six) hours as needed for moderate pain. 20 tablet 0  . isosorbide-hydrALAZINE (BIDIL) 20-37.5 MG tablet Take 1 tablet by mouth 3 (three) times daily.    Marland Kitchen latanoprost (XALATAN) 0.005 % ophthalmic solution Place 1 drop into both eyes at bedtime.    Marland Kitchen levothyroxine (SYNTHROID) 125 MCG tablet Take 125 mcg by mouth daily before breakfast.     . losartan (COZAAR) 100 MG tablet Take 1 tablet (100 mg total) by mouth daily. 180 tablet 1  . Multiple Vitamins-Minerals (PRESERVISION AREDS 2+MULTI VIT PO) Take 1 tablet by mouth in the morning and at bedtime.     Marland Kitchen omeprazole (PRILOSEC) 20 MG capsule Take 1 capsule (20 mg total) by mouth daily. 30 capsule 11  . rOPINIRole (REQUIP) 1 MG tablet Take 1 mg by mouth See admin instructions. Take 1 mg by mouth once a day between 3 PM-4 PM and an additional 1 mg in the evening, if no relief  12   No current facility-administered medications on file prior to visit.     Radiology:   CT Abdomen and Pelvis W Contrast 12/20/2019:  1. Large Infrarenal Abdominal Aortic Aneurysm measuring up to 5.7 cm with mild perianeurysmal inflammation. Differential considerations include impending AAA rupture, mycotic AAA, symptomatic AAA. Recommend urgent transfer to emergency department and Vascular Surgery consultation. Aortic aneurysm NOS (ICD10-I71.9). Aortic Atherosclerosis (ICD10-I70.0). 2. No other acute or inflammatory process identified. 5 cm fat containing left pelvic hernia. Large bowel diverticulosis. 3. Simple appearing 4 cm left ovarian cyst, recommend follow-up Ultrasound in 6-12 months  Chest X-ray 12/20/2019:   Diffuse cardiac enlargement. Emphysematous changes in the lungs. Coarse interstitial pattern to the lungs could represent fibrosis or edema. No focal consolidation. No pleural effusions. No pneumothorax. Calcified and tortuous aorta. Degenerative changes in the shoulders and spine. Old rib fractures. Similar appearance to previous study. IMPRESSION: 1. Cardiac enlargement. 2. Coarse interstitial pattern to the lungs could represent fibrosis or edema.  Abdominal X-Ray 12/20/2019:  Interval placement of aorto iliac stent graft. Nonobstructive bowel gas pattern.  Cardiac Studies:   Lexiscan myoview stress test 12/08/2017: 1. Lexiscan stress test was performed. Exercise capacity was not assessed. Stress symptoms included abdominal pain. Blood pressure was normal. The resting and stress electrocardiogram demonstrated  normal sinus rhythm, normal resting conduction, no resting arrhythmias and normal rest repolarization. 2. The overall quality of the study is good. There is no evidence of abnormal lung activity. Stress and rest SPECT images demonstrate homogeneous tracer distribution throughout the myocardium. Gated SPECT imaging reveals normal myocardial thickening and wall motion. The left ventricular ejection fraction was normal (54%). 3. Low risk  study  Renal artery duplex 09/27/2017: Hemodynamically significant stenosis bilaterally. Renal length is within normal limits for both kidneys. There is an incidental 3.9x3.4x3.4 cm proximal and mid abdominal aortic aneurysm. Recommend dedicated abdominal aortic duplex scan to further define AAA.  Abdominal Aortic Duplex  01/01/2019: Moderate dilatation of the abdominal aorta is noted in the proximal, mid and distal aorta. An abdominal aortic aneurysm measuring 4.5 x 4.52 x 4.52 cm is seen.  Recheck in 6 months for stability of the aneurysm.  TEE 12/24/2019: 1. Left ventricular ejection fraction, by estimation, is 50 to 55%. The left ventricle has low normal function. Left ventricular diastolic function could not be evaluated.  2. Right ventricular systolic function is normal. The right ventricular size is normal.  3. Left atrial size was moderately dilated. No left atrial/left atrial appendage thrombus was detected. The LAA emptying velocity was 30 cm/s.  4. The mitral valve is normal in structure. Mild mitral valve regurgitation. No evidence of mitral stenosis.  5. The aortic valve is calcified. Aortic valve regurgitation is not visualized. Mild aortic valve stenosis. Aortic valve mean gradient measures 10.8 mmHg.  6. There is Moderate (Grade III) layered plaque involving the ascending, transverse and descending aorta.   Echocardiogram 05/27/2020: Left ventricle cavity is normal in size. Moderate concentric hypertrophy of the left ventricle. Normal global wall motion. Normal LV systolic function with EF 56%. Doppler evidence of grade II (pseudonormal) diastolic dysfunction, elevated LAP.  Left atrial cavity is mildly dilated. Trileaflet aortic valve with mild aortic valve leaflet calcification. Mild aortic valve stenosis. Vmax 2.6 m/sec, mean PG 16 mmHg, AVA 1.2 cm2 by continuity equation Mild mitral valve leaflet calcification. Mild (Grade I) mitral regurgitation. Mild tricuspid  regurgitation. Estimated pulmonary artery systolic pressure 31 mmHg.  Compared to previous study on 01/25/2019, trivial change in aortic valve mean PG from 12 to 16 mmHg, PASP from 25 mmHg to 31 mmHg.   EKG   EKG 05/31/2020: Sinus rhythm with borderline first-degree AV block at rate of 61 bpm, left atrial enlargement, normal axis.  Poor R wave progression, probably normal variant but cannot exclude anteroseptal infarct old.  No evidence of ischemia, normal QT interval. No significant change from 01/02/2020.  Assessment     ICD-10-CM   1. Chronic diastolic (congestive) heart failure (HCC)  I50.32 furosemide (LASIX) 20 MG tablet  2. Primary hypertension  I10 furosemide (LASIX) 20 MG tablet  3. Chronic pain of both shoulders  M25.511    G89.29    M25.512    This patients CHA2DS2-VASc Score 6 (CHF, HTN, vasc, age, F) and yearly risk of stroke 9.8%.  Meds ordered this encounter  Medications  . furosemide (LASIX) 20 MG tablet    Sig: Take 1 tablet (20 mg total) by mouth every other day. With an additional dose as needed for fluid overload.    Dispense:  180 tablet    Refill:  0   Medications Discontinued During This Encounter  Medication Reason  . carvedilol (COREG) 25 MG tablet Dose change  . furosemide (LASIX) 20 MG tablet      Recommendations:   Lisa Novas  Mcclure  is a 87 y.o.  female  with past medical history of obesity, chronic diastolic CHF, hypertensive heart disease, paroxysmal atrial fibrillation, anemia, hemorrhoids, diverticulosis, esophageal reflux, depression, OSA intolerant to CPAP on nocturnal 02, rheumatoid arthritis, and venous insufficiency. CT scan of the chest 03/2016 without definitive ILD. LM and 2 vessel CAD, mild calcification on mitral and aortic valve. S/p AAA repair on 12/20/2019.    She had had difficulty in weight loss, difficulty in blood pressure control.  Her last episode of atrial fibrillation and acute diastolic heart failure was in August  2021.  Patient presents for 2-week follow-up of acute on chronic systolic heart failure.  I last visit patient was started on furosemide for diuresis.  BNP has trended down and weight has trended down approximately 9 pounds although on exam she continues to have bilateral basilar rhonchi and expiratory wheezing.  Suspect continued volume overload.  However patient's renal function has deteriorated with the addition of furosemide, therefore will reduce dosing from 20 mg daily to furosemide 20 mg every other day.  Patient is scheduled for repeat chemistry panel in 1 week with PCP, will request records following labs. Patient's blood pressure is well controlled.  In regard to patient's shoulder pain request for steroid therapy, advised systemic steroids may lead to worsening blood pressure control as well as heart failure.  She verbalized understanding.  Advised her to contact Dr. Ernestina Patches who she sees as an orthopedic provider in order to determine further management of bilateral shoulder pain. May consider local steroid injection for the joints.   Again discussed with patient and her daughter, who was present at bedside, regarding diet and weight loss and salt restriction.   Follow up in 4 weeks, sooner if needed, for chronic heart failure.   Patient was seen in collaboration with Dr. Einar Gip. He also reviewed patient's chart and Dr. Einar Gip is in agreement of the plan.    Alethia Berthold, PA-C 06/22/2020, 4:57 PM Office: [phone]

## 2020-06-25 DIAGNOSIS — I5032 Chronic diastolic (congestive) heart failure: Secondary | ICD-10-CM | POA: Diagnosis not present

## 2020-06-25 DIAGNOSIS — R300 Dysuria: Secondary | ICD-10-CM | POA: Diagnosis not present

## 2020-06-25 DIAGNOSIS — N189 Chronic kidney disease, unspecified: Secondary | ICD-10-CM | POA: Diagnosis not present

## 2020-06-25 DIAGNOSIS — Z0000 Encounter for general adult medical examination without abnormal findings: Secondary | ICD-10-CM | POA: Diagnosis not present

## 2020-06-25 DIAGNOSIS — N390 Urinary tract infection, site not specified: Secondary | ICD-10-CM | POA: Diagnosis not present

## 2020-06-26 ENCOUNTER — Telehealth: Payer: Self-pay

## 2020-06-26 NOTE — Telephone Encounter (Signed)
Pt called and would like a call back to set up an appt with Dr. Ernestina Patches

## 2020-06-26 NOTE — Telephone Encounter (Signed)
Pt had right shoulder injection on 11/14/19 and would like a repeat, Please Advise.

## 2020-06-29 NOTE — Telephone Encounter (Signed)
ok 

## 2020-06-29 NOTE — Telephone Encounter (Signed)
Called pt and sch 4/4

## 2020-06-30 ENCOUNTER — Encounter: Payer: Self-pay | Admitting: Pharmacist

## 2020-06-30 NOTE — Progress Notes (Signed)
CARE PLAN ENTRY  06/30/2020 Name: Lisa Mcclure MRN: [id number] DOB: 07-26-1932  Lisa Mcclure is enrolled in Remote Patient Monitoring/Principle Care Monitoring.  Date of Enrollment: 08/23/19 Supervising physician: Adrian Prows Indication: HTN and CHF  Remote Readings: Not Compliant and Avg BP: 132/75, HR:65  Next scheduled OV: 07/23/20  Pharmacist Clinical Goal(s):  Marland Kitchen Over the next 90 days, patient will demonstrate Improved medication adherence as evidenced by medication fill history . Over the next 90 days, patient will demonstrate improved understanding of prescribed medications and rationale for usage as evidenced by patient teach back . Over the next 90 days, patient will experience decrease in ED visits. ED visits in last 6 months = 0 . Over the next 90 days, patient will not experience hospital admission. Hospital Admissions in last 6 months = 0  Interventions: . Provider and Inter-disciplinary care team collaboration (see longitudinal plan of care) . Comprehensive medication review performed. . Discussed plans with patient for ongoing care management follow up and provided patient with direct contact information for care management team . Collaboration with provider re: medication management  Patient Self Care Activities:  . Self administers medications as prescribed . Attends all scheduled provider appointments . Performs ADL's independently . Performs IADL's independently  Allergies  Allergen Reactions  . Statins Other (See Comments)    Muscle aches and INTERNAL BLEEDING  . Gabapentin Other (See Comments)    "Made me loopy"  . Methocarbamol Other (See Comments)    "Made me loopy"   Outpatient Encounter Medications as of 06/30/2020  Medication Sig Note  . amiodarone (PACERONE) 100 MG tablet Take 1 tablet (100 mg total) by mouth daily. (Patient taking differently: Take 100 mg by mouth daily. Taking it in the morning)   . amLODipine (NORVASC) 10 MG tablet  TAKE ONE TABLET BY MOUTH ONCE DAILY   . apixaban (ELIQUIS) 5 MG TABS tablet Take 1 tablet (5 mg total) by mouth 2 (two) times daily.   . Bempedoic Acid-Ezetimibe (NEXLIZET) 180-10 MG TABS Take 1 capsule by mouth daily. (Patient taking differently: Take 1 capsule by mouth daily. Taking it at bedtime)   . busPIRone (BUSPAR) 7.5 MG tablet Take 7.5 mg by mouth as needed.   . carvedilol (COREG) 12.5 MG tablet Take 12.5 mg by mouth 2 (two) times daily with a meal.   . Cholecalciferol (VITAMIN D3) 125 MCG (5000 UT) CAPS Take 5,000 Units by mouth daily with breakfast. Taking it at lunchtime   . diclofenac sodium (VOLTAREN) 1 % GEL Apply 2.25 g topically 3 (three) times daily as needed (for pain).    . furosemide (LASIX) 20 MG tablet Take 1 tablet (20 mg total) by mouth every other day. With an additional dose as needed for fluid overload.   Marland Kitchen HYDROcodone-acetaminophen (NORCO) 7.5-325 MG tablet Take 1 tablet by mouth every 6 (six) hours as needed for moderate pain.   . isosorbide-hydrALAZINE (BIDIL) 20-37.5 MG tablet Take 1 tablet by mouth 3 (three) times daily.   Marland Kitchen latanoprost (XALATAN) 0.005 % ophthalmic solution Place 1 drop into both eyes at bedtime.   Marland Kitchen levothyroxine (SYNTHROID) 125 MCG tablet Take 125 mcg by mouth daily before breakfast.    . losartan (COZAAR) 100 MG tablet Take 1 tablet (100 mg total) by mouth daily.   . Multiple Vitamins-Minerals (PRESERVISION AREDS 2+MULTI VIT PO) Take 1 tablet by mouth in the morning and at bedtime.    Marland Kitchen omeprazole (PRILOSEC) 20 MG capsule Take 1 capsule (20 mg total)  by mouth daily.   Marland Kitchen rOPINIRole (REQUIP) 1 MG tablet Take 1 mg by mouth See admin instructions. Take 1 mg by mouth once a day between 3 PM-4 PM and an additional 1 mg in the evening, if no relief 09/13/2018: Patient takes this religiously daily   No facility-administered encounter medications on file as of 06/30/2020.    Hypertension   BP goal is:  <130/80  Office blood pressures are  BP Readings  from Last 3 Encounters:  06/22/20 (!) 117/53  06/08/20 (!) 162/78  04/21/20 137/74    Patient is currently controlled on the following medications: amlodipine 10 mg, carvedilol 12.5 mg BID, lasix 20 mg QOD, BiDil 20/37.5 mg TID, losartan 100 mg,   Patient checks BP at home 3-5x per week  Patient home BP readings are ranging: 97-161/52-93  Patient has tried these meds in the past: verapamil, telmisartan-HCTZ, metoprolol, spironolactone,   We discussed diet and exercise extensively  Plan  Continue current medications and control with diet and exercise      Heart Failure   Type: Diastolic  Last ejection fraction: 56% NYHA Class: II (slight limitation of activity) AHA HF Stage: C (Heart disease and symptoms present)  Patient has failed these meds in past: verapamil, telmisartan-HCTZ, metoprolol, spironolactone,  Patient is currently controlled on the following medications: carvedilol 12.5 mg BID, lasix 20 mg QOD, BiDil 20/37.5 mg TID, losartan 100 mg,   We discussed diet and exercise extensively and weighing daily; if you gain more than 3 pounds in one day or 5 pounds in one week call your doctor  Plan  Continue current medications and control with diet and exercise ______________ Visit Information SDOH (Social Determinants of Health) assessments performed: Yes.  Ms. Lisa Mcclure was given information about Principle Care Management/Remote Patient Monitoring services today including:  1. RPM/PCM service includes personalized support from designated clinical staff supervised by her physician, including individualized plan of care and coordination with other care providers 2. 24/7 contact phone numbers for assistance for urgent and routine care needs. 3. Standard insurance, coinsurance, copays and deductibles apply for principle care management only during months in which we provide at least 30 minutes of these services. Most insurances cover these services at 100%, however  patients may be responsible for any copay, coinsurance and/or deductible if applicable. This service may help you avoid the need for more expensive face-to-face services. 4. Only one practitioner may furnish and bill the service in a calendar month. 5. The patient may stop PCM/RPM services at any time (effective at the end of the month) by phone call to the office staff.  Patient agreed to services and verbal consent obtained.   Manuela Schwartz, Pharm.D. [facility] [phone] Ext: [phone]

## 2020-07-02 DIAGNOSIS — H53413 Scotoma involving central area, bilateral: Secondary | ICD-10-CM | POA: Diagnosis not present

## 2020-07-06 DIAGNOSIS — N3281 Overactive bladder: Secondary | ICD-10-CM | POA: Diagnosis not present

## 2020-07-06 DIAGNOSIS — I350 Nonrheumatic aortic (valve) stenosis: Secondary | ICD-10-CM | POA: Diagnosis not present

## 2020-07-06 DIAGNOSIS — M810 Age-related osteoporosis without current pathological fracture: Secondary | ICD-10-CM | POA: Diagnosis not present

## 2020-07-06 DIAGNOSIS — Z0000 Encounter for general adult medical examination without abnormal findings: Secondary | ICD-10-CM | POA: Diagnosis not present

## 2020-07-06 DIAGNOSIS — I872 Venous insufficiency (chronic) (peripheral): Secondary | ICD-10-CM | POA: Diagnosis not present

## 2020-07-06 DIAGNOSIS — R269 Unspecified abnormalities of gait and mobility: Secondary | ICD-10-CM | POA: Diagnosis not present

## 2020-07-06 DIAGNOSIS — M25511 Pain in right shoulder: Secondary | ICD-10-CM | POA: Diagnosis not present

## 2020-07-06 DIAGNOSIS — M25512 Pain in left shoulder: Secondary | ICD-10-CM | POA: Diagnosis not present

## 2020-07-06 DIAGNOSIS — G4733 Obstructive sleep apnea (adult) (pediatric): Secondary | ICD-10-CM | POA: Diagnosis not present

## 2020-07-06 DIAGNOSIS — I110 Hypertensive heart disease with heart failure: Secondary | ICD-10-CM | POA: Diagnosis not present

## 2020-07-06 DIAGNOSIS — E039 Hypothyroidism, unspecified: Secondary | ICD-10-CM | POA: Diagnosis not present

## 2020-07-08 ENCOUNTER — Other Ambulatory Visit: Payer: Self-pay | Admitting: Student

## 2020-07-08 ENCOUNTER — Telehealth: Payer: Self-pay | Admitting: Physical Medicine and Rehabilitation

## 2020-07-08 DIAGNOSIS — I5032 Chronic diastolic (congestive) heart failure: Secondary | ICD-10-CM

## 2020-07-08 NOTE — Telephone Encounter (Signed)
Patient called requesting a call back. Patient needs to reschedule appt due to no transportation. Patient phone number is [phone].

## 2020-07-08 NOTE — Telephone Encounter (Signed)
Called pt and r/s 

## 2020-07-11 DIAGNOSIS — I10 Essential (primary) hypertension: Secondary | ICD-10-CM | POA: Diagnosis not present

## 2020-07-13 ENCOUNTER — Ambulatory Visit: Payer: Medicare Other | Admitting: Physical Medicine and Rehabilitation

## 2020-07-14 ENCOUNTER — Other Ambulatory Visit: Payer: Self-pay

## 2020-07-14 ENCOUNTER — Ambulatory Visit: Payer: Self-pay

## 2020-07-14 ENCOUNTER — Encounter: Payer: Self-pay | Admitting: Physical Medicine and Rehabilitation

## 2020-07-14 ENCOUNTER — Ambulatory Visit (INDEPENDENT_AMBULATORY_CARE_PROVIDER_SITE_OTHER): Payer: Medicare Other | Admitting: Physical Medicine and Rehabilitation

## 2020-07-14 DIAGNOSIS — G8929 Other chronic pain: Secondary | ICD-10-CM

## 2020-07-14 DIAGNOSIS — M25511 Pain in right shoulder: Secondary | ICD-10-CM | POA: Diagnosis not present

## 2020-07-14 MED ORDER — TRIAMCINOLONE ACETONIDE 40 MG/ML IJ SUSP
60.0000 mg | INTRAMUSCULAR | Status: AC | PRN
  Administered 2020-07-14: 60 mg via INTRA_ARTICULAR

## 2020-07-14 MED ORDER — BUPIVACAINE HCL 0.5 % IJ SOLN
3.0000 mL | INTRAMUSCULAR | Status: AC | PRN
  Administered 2020-07-14: 3 mL via INTRA_ARTICULAR

## 2020-07-14 NOTE — Progress Notes (Signed)
Pt state right shoulder that travels to her left shoulder. Pt state high back chair she sits and pushing her walking makes the pain worse. Pt state the pain keeps her up at night. Pt state she takes pain meds and heating pads tohelp ease her pain.  Numeric Pain Rating Scale and Functional Assessment Average Pain 7   In the last MONTH (on 0-10 scale) has pain interfered with the following?  1. General activity like being  able to carry out your everyday physical activities such as walking, climbing stairs, carrying groceries, or moving a chair?  Rating(10)   +

## 2020-07-14 NOTE — Progress Notes (Signed)
Lisa Mcclure - 87 y.o. female MRN [id number]  Date of birth: [birth year]  Office Visit Note: Visit Date: 07/14/2020 PCP: [name], MD Referred by: [name], MD  Subjective: Chief Complaint  Patient presents with  . Right Shoulder - Pain  . Left Shoulder - Pain   HPI:  Lisa Mcclure is a 87 y.o. female who comes in today for planned repeat Right anesthetic hip arthrogram with fluoroscopic guidance.  The patient has failed conservative care including home exercise, medications, time and activity modification. Prior injection gave more than 50% relief for several months.  Last injection was in August.  She is also having some left shoulder complaints but not nearly as bad as the right.  Right side does show end-stage osteoarthritis of the right glenohumeral joint.  This injection will be diagnostic and hopefully therapeutic.  Please see requesting physician notes for further details and justification.  Referring: Dr. Janie Morning   ROS Otherwise per HPI.  Assessment & Plan: Visit Diagnoses:    ICD-10-CM   1. Chronic right shoulder pain  M25.511 Large Joint Inj: R glenohumeral   G89.29 XR C-ARM NO REPORT    Plan: No additional findings.   Meds & Orders: No orders of the defined types were placed in this encounter.   Orders Placed This Encounter  Procedures  . Large Joint Inj: R glenohumeral  . XR C-ARM NO REPORT    Follow-up: Return if symptoms worsen or fail to improve.   Procedures: Large Joint Inj: R glenohumeral on 07/14/2020 2:55 PM Indications: pain and diagnostic evaluation Details: 22 G 3.5 in needle, fluoroscopy-guided anteromedial approach  Arthrogram: No  Medications: 3 mL bupivacaine 0.5 %; 60 mg triamcinolone acetonide 40 MG/ML Outcome: tolerated well, no immediate complications  There was excellent flow of contrast producing a partial arthrogram of the glenohumeral joint. The patient did have relief of symptoms during the anesthetic  phase of the injection. Procedure, treatment alternatives, risks and benefits explained, specific risks discussed. Consent was given by the patient. Immediately prior to procedure a time out was called to verify the correct patient, procedure, equipment, support staff and site/side marked as required. Patient was prepped and draped in the usual sterile fashion.          Clinical History: LUMBAR SPINE - COMPLETE 4+ VIEW  COMPARISON: Chest x-ray dated 03/05/2017.  FINDINGS: There is an age-indeterminate compression fracture of the T12 and T11 vertebral bodies. There is some mild height loss of L2 vertebral body. Degenerative changes are noted throughout the visualized lumbar spine. These are greatest at the lower lumbar segments were there is multilevel facet arthrosis and significant multilevel disc height loss. There appears to be a grade 1-2 anterolisthesis of L5 on S1. Diffuse osteopenia is noted.  IMPRESSION: 1. Age-indeterminate height loss of the T11, T12, and L2 vertebral bodies. Correlation with point tenderness is recommended. If there is clinical concern for an acute fracture this can be further evaluated with cross-sectional imaging. 2. Multilevel degenerative changes throughout the lumbar spine. 3. Grade 1-2 anterolisthesis of L5 on S1. 4. Osteopenia.   Electronically Signed By: Constance Holster M.D. On: 09/14/2018 19:25 ------- EXAM: PELVIS - 1-2 VIEW  COMPARISON:  None.  FINDINGS: There is no acute displaced fracture or dislocation. There is advanced osteoarthritis of the right hip. There is mild-to-moderate osteoarthritis of the left hip. There are degenerative changes of the pubic symphysis. There is osteopenia.  IMPRESSION: 1. No acute displaced fracture or dislocation. 2. Advanced osteoarthritis  of the right hip. 3. Mild-to-moderate osteoarthritis of the left hip. 4. Osteopenia which limits detection of nondisplaced  fractures.   Electronically Signed   By: Constance Holster M.D.   On: 09/14/2018 19:20     Objective:  VS:  HT:    WT:   BMI:     BP:   HR: bpm  TEMP: ( )  RESP:  Physical Exam Musculoskeletal:     Comments: Significantly decreased range of motion of the right shoulder with impingement signs with abduction and rotation.      Imaging: No results found.

## 2020-07-20 DIAGNOSIS — I5032 Chronic diastolic (congestive) heart failure: Secondary | ICD-10-CM | POA: Diagnosis not present

## 2020-07-22 NOTE — Progress Notes (Signed)
BNP trended back up and BMP stable compared to previous. Thoughts on going up to daily lasix for 1 week?

## 2020-07-22 NOTE — Progress Notes (Signed)
07/20/2020: Glucose 84, BUN 30, creatinine 1.22, GFR 48, sodium 137, potassium 4.9,  BNP 321

## 2020-07-23 ENCOUNTER — Encounter: Payer: Self-pay | Admitting: Student

## 2020-07-23 ENCOUNTER — Other Ambulatory Visit: Payer: Self-pay

## 2020-07-23 ENCOUNTER — Ambulatory Visit: Payer: Medicare Other | Admitting: Student

## 2020-07-23 VITALS — BP 136/73 | HR 58 | Ht 64.0 in | Wt 206.0 lb

## 2020-07-23 DIAGNOSIS — I10 Essential (primary) hypertension: Secondary | ICD-10-CM | POA: Diagnosis not present

## 2020-07-23 DIAGNOSIS — I5032 Chronic diastolic (congestive) heart failure: Secondary | ICD-10-CM

## 2020-07-23 MED ORDER — SPIRONOLACTONE 25 MG PO TABS: 12.5000 mg | ORAL_TABLET | Freq: Every day | ORAL | 3 refills | Status: DC

## 2020-07-23 NOTE — Progress Notes (Signed)
Primary Physician/Referring:  [name], MD  [name], female    DOB: 1933-01-04, 87 y.o.   MRN: [id number]  Chief Complaint  Patient presents with  . heart failure  . Follow-up   HPI:    Lisa Mcclure  is a 87 y.o.  female  with past medical history of obesity, chronic diastolic CHF, hypertensive heart disease, paroxysmal atrial fibrillation, anemia, hemorrhoids, diverticulosis, esophageal reflux, depression, OSA intolerant to CPAP on nocturnal 02, rheumatoid arthritis, and venous insufficiency. CT scan of the chest 03/2016 without definitive ILD. LM and 2 vessel CAD, mild calcification on mitral and aortic valve. S/p AAA repair on 12/20/2019. She had had difficulty in weight loss, difficulty in blood pressure control.  Her last episode of atrial fibrillation and acute diastolic heart failure was in August 2021.  Patient presents for 4-week follow-up of heart failure and shortness of breath.  Patient reports her symptoms of shortness of breath have significantly improved.  In fact patient states she is feeling relatively well overall.  Denies chest pain, palpitations, dizziness, syncope, near syncope.  Denies orthopnea, PND.  She does continue to have bilateral lower leg edema.  Review of recent lab work revealed that renal function has remained stable, however BNP has trended up.  Past Medical History:  Diagnosis Date  . Anemia    h/o hemorrhoidal bleeding and blood transfusion  . Cardiomegaly   . Chronic diastolic CHF (congestive heart failure) ([place])   . Depression   . Diverticulosis   . DOE (dyspnea on exertion)   . Esophageal reflux   . GERD (gastroesophageal reflux disease)   . Hypothyroidism   . Insomnia   . LBP (low back pain)   . OAB (overactive bladder)   . Obesity   . OSA (obstructive sleep apnea)   . Osteoarthritis   . Rheumatoid arthritis(714.0)   . Shoulder pain, bilateral   . Unspecified essential hypertension   . Venous  insufficiency    Past Surgical History:  Procedure Laterality Date  . ABDOMINAL AORTIC ENDOVASCULAR STENT GRAFT N/A 12/20/2019   Procedure: Aortogram including catheter selection of aorta and bilateral iliac arteriogram, Endovascular repair of infrarenal abdominal aortic aneurysm with bifurcated stent graft (26 mm x 14 x 12 main body, right bell bottom with a 20 mm x 10 cm piece, and left bell bottom with a 16 mm x 12 cm piece) ;  Surgeon: Marty Heck, MD;  Location: [place] [facility];  Service: Vascular;  Laterality: N/A;  . APPENDECTOMY  1953  . bladder abduction-1996  1996  . breast biopsy Right 1980  . CARDIOVERSION N/A 12/24/2019   Procedure: CARDIOVERSION;  Surgeon: Adrian Prows, MD;  Location: [facility] ENDOSCOPY;  Service: Cardiovascular;  Laterality: N/A;  . [place]  . COSMETIC SURGERY  1996  . CYSTOCELE REPAIR    . FLEXIBLE SIGMOIDOSCOPY N/A 04/10/2015   Procedure: FLEXIBLE SIGMOIDOSCOPY;  Surgeon: Arta Silence, MD;  Location: [facility] ENDOSCOPY;  Service: Endoscopy;  Laterality: N/A;  . knee arthroscopy Right 1996, 2010  . KNEE ARTHROSCOPY W/ AUTOGENOUS CARTILAGE IMPLANTATION (ACI) PROCEDURE Left 1994, 1995  . REFRACTIVE SURGERY  01/2020  . SHOULDER SURGERY  1990  . TEE WITHOUT CARDIOVERSION N/A 12/24/2019   Procedure: TRANSESOPHAGEAL ECHOCARDIOGRAM (TEE);  Surgeon: Adrian Prows, MD;  Location: [place];  Service: Cardiovascular;  Laterality: N/A;  . TOTAL ABDOMINAL HYSTERECTOMY  1972  . ULTRASOUND GUIDANCE FOR VASCULAR ACCESS Bilateral 12/20/2019   Procedure: Ultrasound-guided access  of bilateral common femoral arteries for delivery of endograft and percutaneous closure;  Surgeon: Marty Heck, MD;  Location: [place];  Service: Vascular;  Laterality: Bilateral;  . VESICOVAGINAL FISTULA CLOSURE W/ TAH    . WRIST SURGERY  1967   Family History  Problem Relation Age of Onset  . COPD Father   . Lung cancer Mother   . Heart attack Maternal Grandmother 80  .  Breast cancer Maternal Aunt   . Lung cancer Son     Social History   Tobacco Use  . Smoking status: Former Smoker    Packs/day: 0.20    Years: 10.00    Pack years: 2.00    Types: Cigarettes    Quit date: 04/11/1984    Years since quitting: 36.3  . Smokeless tobacco: Never Used  Substance Use Topics  . Alcohol use: Yes    Alcohol/week: 0.0 standard drinks    Comment: cocktail once a week   Marital Status: Widowed  ROS  Review of Systems  Constitutional: Negative for malaise/fatigue.  Cardiovascular: Positive for dyspnea on exertion (improving). Negative for chest pain, leg swelling, orthopnea, palpitations, paroxysmal nocturnal dyspnea and syncope.  Hematologic/Lymphatic: Does not bruise/bleed easily.  Musculoskeletal: Positive for back pain and joint pain (bilateral knees).  Gastrointestinal: Negative for melena.  Neurological: Negative for headaches and light-headedness.   Objective  Blood pressure 136/73, pulse (!) 58, height _0  (1.626 m), weight 206 lb (93.4 kg), SpO2 95 %.  Vitals with BMI 07/23/2020 06/22/2020 06/08/2020  Height _1  _2  -  Weight 206 lbs 202 lbs -  BMI 03.50 09.38 -  Systolic 182 993 716  Diastolic 73 53 78  Pulse 58 50 58     Physical Exam Vitals reviewed.  Constitutional:      General: She is not in acute distress.    Appearance: She is well-developed.  Cardiovascular:     Rate and Rhythm: Normal rate and regular rhythm.     Pulses: Intact distal pulses.          Carotid pulses are 2+ on the right side and 2+ on the left side.      Radial pulses are 2+ on the right side and 2+ on the left side.     Heart sounds: S1 normal and S2 normal. Murmur heard.   Harsh midsystolic murmur is present with a grade of 3/6 at the upper right sternal border radiating to the apex. No gallop.      Comments: No JVD.  Pulmonary:     Effort: Pulmonary effort is normal. No accessory muscle usage or respiratory distress.     Breath sounds: No wheezing or  rhonchi.  Musculoskeletal:        General: Tenderness present.     Right lower leg: Edema (trace) present.     Left lower leg: Edema (trace) present.     Comments: Limited range of motion, bilateral shoulders  Skin:    General: Skin is warm and dry.  Neurological:     General: No focal deficit present.     Mental Status: She is alert and oriented to person, place, and time.    Laboratory examination:   Recent Labs    12/24/19 0627 12/25/19 0339 04/30/20 1200 06/08/20 1607 06/15/20 1443  NA 134* 134* 137 138 139  K 4.2 3.9 4.4 5.1 5.0  CL 97* 97* 96 94* 93*  CO2 _3 GLUCOSE 109* 100* 91 92 109*  BUN 19 18 56* 23 38*  CREATININE 0.89 0.87 1.35* 0.98 1.47*  CALCIUM 8.4* 8.4* 9.0 8.9 9.3  GFRNONAA 60* >60 36*  --   --   GFRAA >60 >60 41*  --   --    CrCl cannot be calculated (Patient's most recent lab result is older than the maximum 21 days allowed.).  CMP Latest Ref Rng & Units 06/15/2020 06/08/2020 04/30/2020  Glucose 65 - 99 mg/dL 109(H) 92 91  BUN 8 - 27 mg/dL 38(H) 23 56(H)  Creatinine 0.57 - 1.00 mg/dL 1.47(H) 0.98 1.35(H)  Sodium 134 - 144 mmol/L 139 138 137  Potassium 3.5 - 5.2 mmol/L 5.0 5.1 4.4  Chloride 96 - 106 mmol/L 93(L) 94(L) 96  CO2 20 - 29 mmol/L _0 Calcium 8.7 - 10.3 mg/dL 9.3 8.9 9.0  Total Protein 6.5 - 8.1 g/dL - - -  Total Bilirubin 0.3 - 1.2 mg/dL - - -  Alkaline Phos 38 - 126 U/L - - -  AST 15 - 41 U/L - - -  ALT 0 - 44 U/L - - -   CBC Latest Ref Rng & Units 12/25/2019 12/24/2019 12/23/2019  WBC 4.0 - 10.5 K/uL 8.8 10.5 12.4(H)  Hemoglobin 12.0 - 15.0 g/dL 9.1(L) 9.1(L) 9.9(L)  Hematocrit 36.0 - 46.0 % 30.7(L) 30.0(L) 32.5(L)  Platelets 150 - 400 K/uL 309 317 354   Lipid Panel    Component Value Date/Time   CHOL 126 03/14/2019 1121   TRIG 82 03/14/2019 1121   HDL 49 03/14/2019 1121   CHOLHDL 5.1 CALC 12/19/2006 0916   VLDL 26 12/19/2006 0916   LDLCALC 61 03/14/2019 1121   LDLDIRECT 159.2 12/19/2006 0916   HEMOGLOBIN  A1C No results found for: HGBA1C, MPG TSH Recent Labs    12/23/19 1424  TSH 0.724   External labs:   07/20/2020: Glucose 84, BUN 30, creatinine 1.22, GFR 48, sodium 137, potassium 4.9,  BNP 321  Cholesterol, total 164.000 m 06/21/2019 HDL 46 MG/DL 06/21/2019 LDL 99.000 mg 06/21/2019 Triglycerides 93.000 06/21/2019  04/14/2020: Serum glucose 86 mg, BUN 22, creatinine 1.13, EGFR 44 mL, sodium 138, potassium 4.3.  CMP otherwise normal. Hb 10.2/HCT 31.5, platelets 383.  02/17/2020:  Hemoglobin 11.1, hematocrit 34.2, platelets 383 BUN 26, creatinine 0.9, EGFR 59, sodium 136, potassium 4.6  01/07/2020: BUN 29, GFR 57, creatinine 0.93, BUN/CR 31, sodium 133, potassium 4.6, chloride 93, BMP otherwise normal  Cholesterol, total 164.000 m 06/21/2019 Triglycerides 93.000 06/21/2019 HDL 46 MG/DL 06/21/2019 LDL 99.000 mg 06/21/2019  Glucose Random 87.000 mg 06/21/2019 BUN 25.000 mg 06/21/2019 Creatinine, Serum 0.800 mg/ 06/21/2019  TSH 3.220 micr 06/21/2019  AST 18, ALT 11 09/14/2018  FOBT: Normal 04/24/2018  MicroAlbumin Urine 13.000 03/23/2017 MicroAlbumin/Creat 37.9 MG/ 03/23/2017  Medications and allergies   Allergies  Allergen Reactions  . Statins Other (See Comments)    Muscle aches and INTERNAL BLEEDING  . Gabapentin Other (See Comments)    "Made me loopy"  . Methocarbamol Other (See Comments)    "Made me loopy"    Current Outpatient Medications on File Prior to Visit  Medication Sig Dispense Refill  . amiodarone (PACERONE) 100 MG tablet Take 1 tablet (100 mg total) by mouth daily. (Patient taking differently: Take 100 mg by mouth daily. Taking it in the morning) 90 tablet 3  . amLODipine (NORVASC) 10 MG tablet TAKE ONE TABLET BY MOUTH ONCE DAILY 90 tablet 1  . apixaban (ELIQUIS) 5 MG TABS tablet Take 1 tablet (5 mg total)  by mouth 2 (two) times daily. 60 tablet 1  . Bempedoic Acid-Ezetimibe (NEXLIZET) 180-10 MG TABS Take 1 capsule by mouth daily. (Patient taking  differently: Take 1 capsule by mouth daily. Taking it at bedtime) 30 tablet 3  . busPIRone (BUSPAR) 7.5 MG tablet Take 7.5 mg by mouth as needed.    . carvedilol (COREG) 12.5 MG tablet Take 12.5 mg by mouth 2 (two) times daily with a meal.    . Cholecalciferol (VITAMIN D3) 125 MCG (5000 UT) CAPS Take 5,000 Units by mouth daily with breakfast. Taking it at lunchtime    . diclofenac sodium (VOLTAREN) 1 % GEL Apply 2.25 g topically 3 (three) times daily as needed (for pain).     Marland Kitchen HYDROcodone-acetaminophen (NORCO) 7.5-325 MG tablet Take 1 tablet by mouth every 6 (six) hours as needed for moderate pain. 20 tablet 0  . isosorbide-hydrALAZINE (BIDIL) 20-37.5 MG tablet Take 1 tablet by mouth 3 (three) times daily.    Marland Kitchen latanoprost (XALATAN) 0.005 % ophthalmic solution Place 1 drop into both eyes at bedtime.    Marland Kitchen levothyroxine (SYNTHROID) 125 MCG tablet Take 125 mcg by mouth daily before breakfast.     . losartan (COZAAR) 100 MG tablet Take 1 tablet (100 mg total) by mouth daily. 180 tablet 1  . Multiple Vitamins-Minerals (PRESERVISION AREDS 2+MULTI VIT PO) Take 1 tablet by mouth in the morning and at bedtime.     Marland Kitchen omeprazole (PRILOSEC) 20 MG capsule Take 1 capsule (20 mg total) by mouth daily. 30 capsule 11  . rOPINIRole (REQUIP) 1 MG tablet Take 1 mg by mouth See admin instructions. Take 1 mg by mouth once a day between 3 PM-4 PM and an additional 1 mg in the evening, if no relief  12   No current facility-administered medications on file prior to visit.    Radiology:   CT Abdomen and Pelvis W Contrast 12/20/2019:  1. Large Infrarenal Abdominal Aortic Aneurysm measuring up to 5.7 cm with mild perianeurysmal inflammation. Differential considerations include impending AAA rupture, mycotic AAA, symptomatic AAA. Recommend urgent transfer to emergency department and Vascular Surgery consultation. Aortic aneurysm NOS (ICD10-I71.9). Aortic Atherosclerosis (ICD10-I70.0). 2. No other acute or inflammatory  process identified. 5 cm fat containing left pelvic hernia. Large bowel diverticulosis. 3. Simple appearing 4 cm left ovarian cyst, recommend follow-up Ultrasound in 6-12 months  Chest X-ray 12/20/2019:   Diffuse cardiac enlargement. Emphysematous changes in the lungs. Coarse interstitial pattern to the lungs could represent fibrosis or edema. No focal consolidation. No pleural effusions. No pneumothorax. Calcified and tortuous aorta. Degenerative changes in the shoulders and spine. Old rib fractures. Similar appearance to previous study. IMPRESSION: 1. Cardiac enlargement. 2. Coarse interstitial pattern to the lungs could represent fibrosis or edema.  Abdominal X-Ray 12/20/2019:  Interval placement of aorto iliac stent graft. Nonobstructive bowel gas pattern.  Cardiac Studies:   Lexiscan myoview stress test 12/08/2017: 1. Lexiscan stress test was performed. Exercise capacity was not assessed. Stress symptoms included abdominal pain. Blood pressure was normal. The resting and stress electrocardiogram demonstrated normal sinus rhythm, normal resting conduction, no resting arrhythmias and normal rest repolarization. 2. The overall quality of the study is good. There is no evidence of abnormal lung activity. Stress and rest SPECT images demonstrate homogeneous tracer distribution throughout the myocardium. Gated SPECT imaging reveals normal myocardial thickening and wall motion. The left ventricular ejection fraction was normal (54%). 3. Low risk study  Renal artery duplex 09/27/2017: Hemodynamically significant stenosis bilaterally. Renal length is within  normal limits for both kidneys. There is an incidental 3.9x3.4x3.4 cm proximal and mid abdominal aortic aneurysm. Recommend dedicated abdominal aortic duplex scan to further define AAA.  Abdominal Aortic Duplex  01/01/2019: Moderate dilatation of the abdominal aorta is noted in the proximal, mid and distal aorta. An abdominal aortic  aneurysm measuring 4.5 x 4.52 x 4.52 cm is seen.  Recheck in 6 months for stability of the aneurysm.  TEE 12/24/2019: 1. Left ventricular ejection fraction, by estimation, is 50 to 55%. The left ventricle has low normal function. Left ventricular diastolic function could not be evaluated.  2. Right ventricular systolic function is normal. The right ventricular size is normal.  3. Left atrial size was moderately dilated. No left atrial/left atrial appendage thrombus was detected. The LAA emptying velocity was 30 cm/s.  4. The mitral valve is normal in structure. Mild mitral valve regurgitation. No evidence of mitral stenosis.  5. The aortic valve is calcified. Aortic valve regurgitation is not visualized. Mild aortic valve stenosis. Aortic valve mean gradient measures 10.8 mmHg.  6. There is Moderate (Grade III) layered plaque involving the ascending, transverse and descending aorta.   Echocardiogram 05/27/2020: Left ventricle cavity is normal in size. Moderate concentric hypertrophy of the left ventricle. Normal global wall motion. Normal LV systolic function with EF 56%. Doppler evidence of grade II (pseudonormal) diastolic dysfunction, elevated LAP.  Left atrial cavity is mildly dilated. Trileaflet aortic valve with mild aortic valve leaflet calcification. Mild aortic valve stenosis. Vmax 2.6 m/sec, mean PG 16 mmHg, AVA 1.2 cm2 by continuity equation Mild mitral valve leaflet calcification. Mild (Grade I) mitral regurgitation. Mild tricuspid regurgitation. Estimated pulmonary artery systolic pressure 31 mmHg.  Compared to previous study on 01/25/2019, trivial change in aortic valve mean PG from 12 to 16 mmHg, PASP from 25 mmHg to 31 mmHg.   EKG   EKG 05/31/2020: Sinus rhythm with borderline first-degree AV block at rate of 61 bpm, left atrial enlargement, normal axis.  Poor R wave progression, probably normal variant but cannot exclude anteroseptal infarct old.  No evidence of ischemia,  normal QT interval. No significant change from 01/02/2020.  Assessment     ICD-10-CM   1. Primary hypertension  W09 Basic metabolic panel    CBC    furosemide (LASIX) 20 MG tablet  2. Chronic diastolic (congestive) heart failure (HCC)  W11.91 Basic metabolic panel    CBC    furosemide (LASIX) 20 MG tablet   This patients CHA2DS2-VASc Score 6 (CHF, HTN, vasc, age, F) and yearly risk of stroke 9.8%.  Meds ordered this encounter  Medications  . spironolactone (ALDACTONE) 25 MG tablet    Sig: Take 0.5 tablets (12.5 mg total) by mouth daily.    Dispense:  15 tablet    Refill:  3  . furosemide (LASIX) 20 MG tablet    Sig: Take 1 tablet (20 mg total) by mouth daily. With an additional dose as needed for fluid overload.    Dispense:  180 tablet    Refill:  0   Medications Discontinued During This Encounter  Medication Reason  . furosemide (LASIX) 20 MG tablet      Recommendations:   Lisa Mcclure  is a 87 y.o.  female  with past medical history of obesity, chronic diastolic CHF, hypertensive heart disease, paroxysmal atrial fibrillation, anemia, hemorrhoids, diverticulosis, esophageal reflux, depression, OSA intolerant to CPAP on nocturnal 02, rheumatoid arthritis, and venous insufficiency. CT scan of the chest 03/2016 without definitive ILD. LM and  2 vessel CAD, mild calcification on mitral and aortic valve. S/p AAA repair on 12/20/2019.    She had had difficulty in weight loss, difficulty in blood pressure control.  Her last episode of atrial fibrillation and acute diastolic heart failure was in August 2021.  Patient presents for 4-week follow-up of heart failure and shortness of breath.  Patient symptoms have significantly improved and her pulmonary exam is without significant rales or wheezes at this time.  She does continue to have bilateral lower extremity edema, which has also significantly improved compared to last visit.  Patient continues to take furosemide 20 mg daily.   Patient's renal function has remained stable, however her BNP has trended up to 321.  We will therefore add spironolactone 12.5 mg daily and repeat BMP in 1 week.  We will also obtain CBC a at patient's request as her hemoglobin was low at last check per PCP.  Blood pressure is well controlled.  Patient will notify our office immediately if she experiences worsening leg swelling, dyspnea, orthopnea.  Follow-up in 3 months, sooner if needed, for hypertension, a fib, and heart failure.   Lisa Berthold, PA-C 07/24/2020, 11:35 AM Office: [phone]

## 2020-07-24 MED ORDER — FUROSEMIDE 20 MG PO TABS: 20.0000 mg | ORAL_TABLET | Freq: Every day | ORAL | 0 refills | Status: DC

## 2020-07-28 DIAGNOSIS — H53413 Scotoma involving central area, bilateral: Secondary | ICD-10-CM | POA: Diagnosis not present

## 2020-07-30 DIAGNOSIS — H43813 Vitreous degeneration, bilateral: Secondary | ICD-10-CM | POA: Diagnosis not present

## 2020-07-30 DIAGNOSIS — H353221 Exudative age-related macular degeneration, left eye, with active choroidal neovascularization: Secondary | ICD-10-CM | POA: Diagnosis not present

## 2020-07-30 DIAGNOSIS — H353114 Nonexudative age-related macular degeneration, right eye, advanced atrophic with subfoveal involvement: Secondary | ICD-10-CM | POA: Diagnosis not present

## 2020-07-30 DIAGNOSIS — H43393 Other vitreous opacities, bilateral: Secondary | ICD-10-CM | POA: Diagnosis not present

## 2020-08-03 DIAGNOSIS — G4733 Obstructive sleep apnea (adult) (pediatric): Secondary | ICD-10-CM | POA: Diagnosis not present

## 2020-08-04 DIAGNOSIS — I5032 Chronic diastolic (congestive) heart failure: Secondary | ICD-10-CM | POA: Diagnosis not present

## 2020-08-04 DIAGNOSIS — I10 Essential (primary) hypertension: Secondary | ICD-10-CM | POA: Diagnosis not present

## 2020-08-05 LAB — CBC
Hematocrit: 36.2 % (ref 34.0–46.6)
Hemoglobin: 11.8 g/dL (ref 11.1–15.9)
MCH: 27.4 pg (ref 26.6–33.0)
MCHC: 32.6 g/dL (ref 31.5–35.7)
MCV: 84 fL (ref 79–97)
Platelets: 393 10*3/uL (ref 150–450)
RBC: 4.31 x10E6/uL (ref 3.77–5.28)
RDW: 16.1 % — ABNORMAL HIGH (ref 11.7–15.4)
WBC: 6.6 10*3/uL (ref 3.4–10.8)

## 2020-08-05 LAB — BASIC METABOLIC PANEL
BUN/Creatinine Ratio: 19 (ref 12–28)
BUN: 36 mg/dL — ABNORMAL HIGH (ref 8–27)
CO2: 26 mmol/L (ref 20–29)
Calcium: 9.1 mg/dL (ref 8.7–10.3)
Chloride: 96 mmol/L (ref 96–106)
Creatinine, Ser: 1.87 mg/dL — ABNORMAL HIGH (ref 0.57–1.00)
Glucose: 88 mg/dL (ref 65–99)
Potassium: 5 mmol/L (ref 3.5–5.2)
Sodium: 137 mmol/L (ref 134–144)
eGFR: 26 mL/min/{1.73_m2} — ABNORMAL LOW (ref >59)

## 2020-08-06 ENCOUNTER — Other Ambulatory Visit: Payer: Self-pay | Admitting: Pharmacist

## 2020-08-06 DIAGNOSIS — I5032 Chronic diastolic (congestive) heart failure: Secondary | ICD-10-CM

## 2020-08-06 MED ORDER — SPIRONOLACTONE 25 MG PO TABS
12.5000 mg | ORAL_TABLET | Freq: Every day | ORAL | 0 refills | Status: DC
Start: 2020-08-06 — End: 2020-11-09

## 2020-08-06 NOTE — Telephone Encounter (Signed)
Called and reviewed recent lab results with pt. Slight increase in renal dysfunction following increased lasix 20 mg dose from QOD to Qday and new start spironolactone 12.5 mg daily. Pt reports that her previous concerns of lower extremity swelling have improved significantly following recent increased diuretic use. States that her legs "are back to where she could see them again".   Discussed with [name]. Will have pt reduce lasix to PRN use and continue spironolactone 12.5 mg daily. Pt agreeable to get repeat labs in 1 week. Encouraged pt to maintain fluid intake 2-3 L/day and avoid any NSAIDs or other nephrotoxic agents.

## 2020-08-10 DIAGNOSIS — I10 Essential (primary) hypertension: Secondary | ICD-10-CM | POA: Diagnosis not present

## 2020-08-11 DIAGNOSIS — I5032 Chronic diastolic (congestive) heart failure: Secondary | ICD-10-CM | POA: Diagnosis not present

## 2020-08-12 LAB — BASIC METABOLIC PANEL
BUN/Creatinine Ratio: 22 (ref 12–28)
BUN: 25 mg/dL (ref 8–27)
CO2: 24 mmol/L (ref 20–29)
Calcium: 9 mg/dL (ref 8.7–10.3)
Chloride: 99 mmol/L (ref 96–106)
Creatinine, Ser: 1.15 mg/dL — ABNORMAL HIGH (ref 0.57–1.00)
Glucose: 88 mg/dL (ref 65–99)
Potassium: 4.9 mmol/L (ref 3.5–5.2)
Sodium: 139 mmol/L (ref 134–144)
eGFR: 47 mL/min/{1.73_m2} — ABNORMAL LOW (ref >59)

## 2020-08-12 NOTE — Telephone Encounter (Signed)
Called and reviewed lab results with pt. Pt has increased her fluid intake as discussed and pt planing on continuing to stay consistent. Using Lasix PRN for weight gain >3 lbs/day or >5 lbs/weeks. Continues to tolerate spironolactone. Denies any active complains of edema or SOB. Pt aware to continue monitoring and to notify the office if she has any additional symptoms.

## 2020-08-12 NOTE — Telephone Encounter (Signed)
Renal function looks good. I am happy to call her and let her know unless you wanted to touch base with her for any reason. Let me know. Thanks

## 2020-08-18 DIAGNOSIS — H53413 Scotoma involving central area, bilateral: Secondary | ICD-10-CM | POA: Diagnosis not present

## 2020-08-19 DIAGNOSIS — H401113 Primary open-angle glaucoma, right eye, severe stage: Secondary | ICD-10-CM | POA: Diagnosis not present

## 2020-09-01 ENCOUNTER — Telehealth: Payer: Self-pay

## 2020-09-01 NOTE — Telephone Encounter (Signed)
Pt called with questions after noticing an area on right side of pubic area that is "thicker skin feeling". She has no tenderness or pain in the area, just noticed it feels "harder". Per APP, she has been advised to keep an eye on it and if anything changes/worsens she will let us know. Pt was in agreement and will call back if she has further questions/concerns.

## 2020-09-08 DIAGNOSIS — L97919 Non-pressure chronic ulcer of unspecified part of right lower leg with unspecified severity: Secondary | ICD-10-CM | POA: Diagnosis not present

## 2020-09-08 DIAGNOSIS — I5032 Chronic diastolic (congestive) heart failure: Secondary | ICD-10-CM | POA: Diagnosis not present

## 2020-09-08 DIAGNOSIS — S8991XA Unspecified injury of right lower leg, initial encounter: Secondary | ICD-10-CM | POA: Diagnosis not present

## 2020-09-08 DIAGNOSIS — L03115 Cellulitis of right lower limb: Secondary | ICD-10-CM | POA: Diagnosis not present

## 2020-09-08 DIAGNOSIS — I87311 Chronic venous hypertension (idiopathic) with ulcer of right lower extremity: Secondary | ICD-10-CM | POA: Diagnosis not present

## 2020-09-08 DIAGNOSIS — R600 Localized edema: Secondary | ICD-10-CM | POA: Diagnosis not present

## 2020-09-08 DIAGNOSIS — I110 Hypertensive heart disease with heart failure: Secondary | ICD-10-CM | POA: Diagnosis not present

## 2020-09-09 DIAGNOSIS — I10 Essential (primary) hypertension: Secondary | ICD-10-CM | POA: Diagnosis not present

## 2020-09-17 ENCOUNTER — Telehealth: Payer: Self-pay

## 2020-09-17 NOTE — Telephone Encounter (Signed)
Advise her to take a full tablet for the next 3 days. She may then try taking 1/2 tablet daily but would advise full tablet if increased swelling or weight gain.

## 2020-09-17 NOTE — Telephone Encounter (Signed)
Patient called because her ankles are swollen and hard. She was told to take her lasix as needed. She did not take it yesterday and she gained 2 pounds. Her walker poked her leg and it was leaking fluid about a week ago. She went to the doctor for that and her wound is now healing. She would like to know if there is anything else she can take besides the lasix that would be better for her kidneys? She wants to take something regularly instead of as needed. She would like to know if she can she continue to take half of the lasix every day or switch back to hydrochlorothiazide.

## 2020-09-17 NOTE — Telephone Encounter (Signed)
Patient would like to know if it is okay for her to take half the tablet. Or would the whole tablet be better

## 2020-09-18 NOTE — Telephone Encounter (Signed)
Called and spoke to pt regarding lasix. Pt voiced understanding.

## 2020-09-28 NOTE — Telephone Encounter (Signed)
Pt called back with complains of lower extremity swelling. Reports that swelling improved slightly following increasing her lasix dose to lasix 20 mg for 3 days, but pt went back to taking lasix 10 mg since. Unable to put on her compression stocking currently due to the degree of swelling. Pt reports that leg continues to weep fluid. Pt denies any recent increased salt intake, changes in medications, or lifestyle. Denies any complains of worsening SOB, DOE. Pt looking for recommendation for alternative agents to help address the recurrent lower extremity swelling. Pt feels like her current lasix dose isnt addressing repeated lower extremity swelling. Pt is also worried about her renal function with continued increased lasix dose. Pt willing to come to the office to assess her lower extremity edema and diuretic management. Reviewed with [name]. Pt agreeable to increase her lasix dose to 20 mg daily in the meantime. OV scheduled for 09/30/20.

## 2020-09-30 ENCOUNTER — Encounter: Payer: Self-pay | Admitting: Student

## 2020-09-30 ENCOUNTER — Other Ambulatory Visit: Payer: Self-pay

## 2020-09-30 ENCOUNTER — Ambulatory Visit: Payer: Medicare Other | Admitting: Student

## 2020-09-30 DIAGNOSIS — I10 Essential (primary) hypertension: Secondary | ICD-10-CM

## 2020-09-30 DIAGNOSIS — I5032 Chronic diastolic (congestive) heart failure: Secondary | ICD-10-CM

## 2020-09-30 MED ORDER — FUROSEMIDE 20 MG PO TABS: 20.0000 mg | ORAL_TABLET | Freq: Two times a day (BID) | ORAL | 0 refills | Status: DC

## 2020-09-30 NOTE — Progress Notes (Signed)
Primary Physician/Referring:  [name], MD  [name], female    DOB: 1933-01-11, 87 y.o.   MRN: [id number]  Chief Complaint  Patient presents with   Follow-up   Leg Swelling   HPI:    Lisa Mcclure  is a 87 y.o.  female  with past medical history of obesity, chronic diastolic CHF, hypertensive heart disease, paroxysmal atrial fibrillation, anemia, hemorrhoids, diverticulosis, esophageal reflux, depression, OSA intolerant to CPAP on nocturnal 02, rheumatoid arthritis, and venous insufficiency. CT scan of the chest 03/2016 without definitive ILD. LM and 2 vessel CAD, mild calcification on mitral and aortic valve. S/p AAA repair on 12/20/2019. She had had difficulty in weight loss, difficulty in blood pressure control.  Her last episode of atrial fibrillation was in August 2021.  Her blood pressure has been well controlled.  Patient presents today for urgent visit at her request with complaints of worsening bilateral lower leg edema.  On recent visits to our office patient's Lasix had been changed from daily to 10 mg daily, and then to as needed.  However over the last few weeks she has been experiencing worsening bilateral lower leg edema.  Denies chest pain, dyspnea, orthopnea, PND.  Past Medical History:  Diagnosis Date   Anemia    h/o hemorrhoidal bleeding and blood transfusion   Cardiomegaly    Chronic diastolic CHF (congestive heart failure) (HCC)    Depression    Diverticulosis    DOE (dyspnea on exertion)    Esophageal reflux    GERD (gastroesophageal reflux disease)    Hypothyroidism    Insomnia    LBP (low back pain)    OAB (overactive bladder)    Obesity    OSA (obstructive sleep apnea)    Osteoarthritis    Rheumatoid arthritis(714.0)    Shoulder pain, bilateral    Unspecified essential hypertension    Venous insufficiency    Past Surgical History:  Procedure Laterality Date   ABDOMINAL AORTIC ENDOVASCULAR STENT GRAFT N/A 12/20/2019    Procedure: Aortogram including catheter selection of aorta and bilateral iliac arteriogram, Endovascular repair of infrarenal abdominal aortic aneurysm with bifurcated stent graft (26 mm x 14 x 12 main body, right bell bottom with a 20 mm x 10 cm piece, and left bell bottom with a 16 mm x 12 cm piece) ;  Surgeon: Marty Heck, MD;  Location: [place];  Service: Vascular;  Laterality: N/A;   APPENDECTOMY  1953   bladder abduction-1996  1996   breast biopsy Right 1980   CARDIOVERSION N/A 12/24/2019   Procedure: CARDIOVERSION;  Surgeon: Adrian Prows, MD;  Location: [facility] ENDOSCOPY;  Service: Cardiovascular;  Laterality: N/A;   [place] N/A 04/10/2015   Procedure: FLEXIBLE SIGMOIDOSCOPY;  Surgeon: Arta Silence, MD;  Location: [facility] ENDOSCOPY;  Service: Endoscopy;  Laterality: N/A;   knee arthroscopy Right 1996, 2010   KNEE ARTHROSCOPY W/ AUTOGENOUS CARTILAGE IMPLANTATION (ACI) PROCEDURE Left 1994, 1995   REFRACTIVE SURGERY  01/2020   SHOULDER SURGERY  1990   TEE WITHOUT CARDIOVERSION N/A 12/24/2019   Procedure: TRANSESOPHAGEAL ECHOCARDIOGRAM (TEE);  Surgeon: Adrian Prows, MD;  Location: [place];  Service: Cardiovascular;  Laterality: N/A;   [place] Bilateral 12/20/2019   Procedure: Ultrasound-guided access of bilateral common femoral arteries for delivery of endograft and  percutaneous closure;  Surgeon: Marty Heck, MD;  Location: [facility] OR;  Service: Vascular;  Laterality: Bilateral;   VESICOVAGINAL FISTULA CLOSURE W/ TAH     WRIST SURGERY  1967   Family History  Problem Relation Age of Onset   COPD Father    Lung cancer Mother    Heart attack Maternal Grandmother 14   Breast cancer Maternal Aunt    Lung cancer Son     Social History   Tobacco Use   Smoking status: Former    Packs/day: 0.20    Years: 10.00     Pack years: 2.00    Types: Cigarettes    Quit date: 04/11/1984    Years since quitting: 36.4   Smokeless tobacco: Never  Substance Use Topics   Alcohol use: Yes    Alcohol/week: 0.0 standard drinks    Comment: cocktail once a week   Marital Status: Widowed  ROS  Review of Systems  Cardiovascular:  Positive for dyspnea on exertion (improving) and leg swelling. Negative for chest pain, orthopnea, palpitations, paroxysmal nocturnal dyspnea and syncope.  Musculoskeletal:  Positive for back pain and joint pain (bilateral knees).  Objective  Blood pressure (!) 115/54, pulse (!) 55, temperature 97.8 F (36.6 C), temperature source Temporal, height 5' 4"  (1.626 m), weight 208 lb (94.3 kg), SpO2 96 %.  Vitals with BMI 09/30/2020 07/23/2020 06/22/2020  Height 5' 4"  5' 4"  5' 4"   Weight 208 lbs 206 lbs 202 lbs  BMI 35.69 32.44 01.02  Systolic 725 366 440  Diastolic 54 73 53  Pulse 55 58 50     Physical Exam Vitals reviewed.  Constitutional:      General: She is not in acute distress.    Appearance: She is well-developed.  Cardiovascular:     Rate and Rhythm: Normal rate and regular rhythm.     Pulses: Intact distal pulses.          Carotid pulses are 2+ on the right side and 2+ on the left side.      Radial pulses are 2+ on the right side and 2+ on the left side.     Heart sounds: S1 normal and S2 normal. Murmur heard.  Harsh midsystolic murmur is present with a grade of 3/6 at the upper right sternal border radiating to the apex.    No gallop.     Comments: No JVD.  Pulmonary:     Effort: Pulmonary effort is normal. No accessory muscle usage or respiratory distress.     Breath sounds: No wheezing or rhonchi.  Musculoskeletal:        General: Tenderness present.     Right lower leg: Edema (1+ pitting) present.     Left lower leg: Edema (1+ pitting) present.     Comments: Limited range of motion, bilateral shoulders  Skin:    General: Skin is warm and dry.  Neurological:     General:  No focal deficit present.     Mental Status: She is alert and oriented to person, place, and time.   Laboratory examination:   Recent Labs    12/24/19 0627 12/25/19 0339 04/30/20 1200 06/08/20 1607 06/15/20 1443 08/04/20 1116 08/11/20 1525  NA 134* 134* 137   < > 139 137 139  K 4.2 3.9 4.4   < > 5.0 5.0 4.9  CL 97* 97* 96   < > 93* 96 99  CO2 27 28 27    < > 28 26 24   GLUCOSE 109* 100*  91   < > 109* 88 88  BUN 19 18 56*   < > 38* 36* 25  CREATININE 0.89 0.87 1.35*   < > 1.47* 1.87* 1.15*  CALCIUM 8.4* 8.4* 9.0   < > 9.3 9.1 9.0  GFRNONAA 60* >60 36*  --   --   --   --   GFRAA >60 >60 41*  --   --   --   --    < > = values in this interval not displayed.   CrCl cannot be calculated (Patient's most recent lab result is older than the maximum 21 days allowed.).  CMP Latest Ref Rng & Units 08/11/2020 08/04/2020 06/15/2020  Glucose 65 - 99 mg/dL 88 88 109(H)  BUN 8 - 27 mg/dL 25 36(H) 38(H)  Creatinine 0.57 - 1.00 mg/dL 1.15(H) 1.87(H) 1.47(H)  Sodium 134 - 144 mmol/L 139 137 139  Potassium 3.5 - 5.2 mmol/L 4.9 5.0 5.0  Chloride 96 - 106 mmol/L 99 96 93(L)  CO2 20 - 29 mmol/L 24 26 28   Calcium 8.7 - 10.3 mg/dL 9.0 9.1 9.3  Total Protein 6.5 - 8.1 g/dL - - -  Total Bilirubin 0.3 - 1.2 mg/dL - - -  Alkaline Phos 38 - 126 U/L - - -  AST 15 - 41 U/L - - -  ALT 0 - 44 U/L - - -   CBC Latest Ref Rng & Units 08/04/2020 12/25/2019 12/24/2019  WBC 3.4 - 10.8 x10E3/uL 6.6 8.8 10.5  Hemoglobin 11.1 - 15.9 g/dL 11.8 9.1(L) 9.1(L)  Hematocrit 34.0 - 46.6 % 36.2 30.7(L) 30.0(L)  Platelets 150 - 450 x10E3/uL 393 309 317   Lipid Panel    Component Value Date/Time   CHOL 126 03/14/2019 1121   TRIG 82 03/14/2019 1121   HDL 49 03/14/2019 1121   CHOLHDL 5.1 CALC 12/19/2006 0916   VLDL 26 12/19/2006 0916   LDLCALC 61 03/14/2019 1121   LDLDIRECT 159.2 12/19/2006 0916   HEMOGLOBIN A1C No results found for: HGBA1C, MPG TSH Recent Labs    12/23/19 1424  TSH 0.724   External labs:    07/20/2020: Glucose 84, BUN 30, creatinine 1.22, GFR 48, sodium 137, potassium 4.9,  BNP 321  Cholesterol, total 164.000 m 06/21/2019 HDL 46 MG/DL 06/21/2019 LDL 99.000 mg 06/21/2019 Triglycerides 93.000 06/21/2019  04/14/2020: Serum glucose 86 mg, BUN 22, creatinine 1.13, EGFR 44 mL, sodium 138, potassium 4.3.  CMP otherwise normal. Hb 10.2/HCT 31.5, platelets 383.  02/17/2020:  Hemoglobin 11.1, hematocrit 34.2, platelets 383 BUN 26, creatinine 0.9, EGFR 59, sodium 136, potassium 4.6  01/07/2020: BUN 29, GFR 57, creatinine 0.93, BUN/CR 31, sodium 133, potassium 4.6, chloride 93, BMP otherwise normal  Cholesterol, total 164.000 m 06/21/2019 Triglycerides 93.000 06/21/2019 HDL 46 MG/DL 06/21/2019 LDL 99.000 mg 06/21/2019  Glucose Random 87.000 mg 06/21/2019 BUN 25.000 mg 06/21/2019 Creatinine, Serum 0.800 mg/ 06/21/2019  TSH 3.220 micr 06/21/2019  AST 18, ALT 11 09/14/2018  FOBT: Normal 04/24/2018  MicroAlbumin Urine 13.000 03/23/2017 MicroAlbumin/Creat 37.9 MG/ 03/23/2017 Allergies   Allergies  Allergen Reactions   Statins Other (See Comments)    Muscle aches and INTERNAL BLEEDING   Gabapentin Other (See Comments)    "Made me loopy"   Methocarbamol Other (See Comments)    "Made me loopy"      Medications Prior to Visit:   Outpatient Medications Prior to Visit  Medication Sig Dispense Refill   amiodarone (PACERONE) 100 MG tablet Take 1 tablet (100 mg total) by mouth  daily. (Patient taking differently: Take 100 mg by mouth daily. Taking it in the morning) 90 tablet 3   amLODipine (NORVASC) 10 MG tablet TAKE ONE TABLET BY MOUTH ONCE DAILY 90 tablet 1   apixaban (ELIQUIS) 5 MG TABS tablet Take 1 tablet (5 mg total) by mouth 2 (two) times daily. 60 tablet 1   Bempedoic Acid-Ezetimibe (NEXLIZET) 180-10 MG TABS Take 1 capsule by mouth daily. (Patient taking differently: Take 1 capsule by mouth daily. Taking it at bedtime) 30 tablet 3   busPIRone (BUSPAR) 7.5 MG tablet Take 7.5  mg by mouth as needed.     carvedilol (COREG) 12.5 MG tablet Take 12.5 mg by mouth 2 (two) times daily with a meal.     Cholecalciferol (VITAMIN D3) 125 MCG (5000 UT) CAPS Take 5,000 Units by mouth daily with breakfast. Taking it at lunchtime     diclofenac sodium (VOLTAREN) 1 % GEL Apply 2.25 g topically 3 (three) times daily as needed (for pain).      HYDROcodone-acetaminophen (NORCO) 7.5-325 MG tablet Take 1 tablet by mouth every 6 (six) hours as needed for moderate pain. 20 tablet 0   isosorbide-hydrALAZINE (BIDIL) 20-37.5 MG tablet Take 1 tablet by mouth 3 (three) times daily.     latanoprost (XALATAN) 0.005 % ophthalmic solution Place 1 drop into both eyes at bedtime.     levothyroxine (SYNTHROID) 125 MCG tablet Take 125 mcg by mouth daily before breakfast.      losartan (COZAAR) 100 MG tablet Take 1 tablet (100 mg total) by mouth daily. 180 tablet 1   Multiple Vitamins-Minerals (PRESERVISION AREDS 2+MULTI VIT PO) Take 1 tablet by mouth in the morning and at bedtime.      omeprazole (PRILOSEC) 20 MG capsule Take 1 capsule (20 mg total) by mouth daily. 30 capsule 11   rOPINIRole (REQUIP) 1 MG tablet Take 1 mg by mouth See admin instructions. Take 1 mg by mouth once a day between 3 PM-4 PM and an additional 1 mg in the evening, if no relief  12   spironolactone (ALDACTONE) 25 MG tablet Take 0.5 tablets (12.5 mg total) by mouth daily. 15 tablet 0   furosemide (LASIX) 20 MG tablet Take 1 tablet (20 mg total) by mouth daily. With an additional dose as needed for fluid overload. (Patient taking differently: Take 20 mg by mouth daily as needed for fluid or edema.) 180 tablet 0   No facility-administered medications prior to visit.     Final Medications at End of Visit    Current Meds  Medication Sig   amiodarone (PACERONE) 100 MG tablet Take 1 tablet (100 mg total) by mouth daily. (Patient taking differently: Take 100 mg by mouth daily. Taking it in the morning)   amLODipine (NORVASC) 10 MG  tablet TAKE ONE TABLET BY MOUTH ONCE DAILY   apixaban (ELIQUIS) 5 MG TABS tablet Take 1 tablet (5 mg total) by mouth 2 (two) times daily.   Bempedoic Acid-Ezetimibe (NEXLIZET) 180-10 MG TABS Take 1 capsule by mouth daily. (Patient taking differently: Take 1 capsule by mouth daily. Taking it at bedtime)   busPIRone (BUSPAR) 7.5 MG tablet Take 7.5 mg by mouth as needed.   carvedilol (COREG) 12.5 MG tablet Take 12.5 mg by mouth 2 (two) times daily with a meal.   Cholecalciferol (VITAMIN D3) 125 MCG (5000 UT) CAPS Take 5,000 Units by mouth daily with breakfast. Taking it at lunchtime   diclofenac sodium (VOLTAREN) 1 % GEL Apply 2.25 g topically 3 (  three) times daily as needed (for pain).    HYDROcodone-acetaminophen (NORCO) 7.5-325 MG tablet Take 1 tablet by mouth every 6 (six) hours as needed for moderate pain.   isosorbide-hydrALAZINE (BIDIL) 20-37.5 MG tablet Take 1 tablet by mouth 3 (three) times daily.   latanoprost (XALATAN) 0.005 % ophthalmic solution Place 1 drop into both eyes at bedtime.   levothyroxine (SYNTHROID) 125 MCG tablet Take 125 mcg by mouth daily before breakfast.    losartan (COZAAR) 100 MG tablet Take 1 tablet (100 mg total) by mouth daily.   Multiple Vitamins-Minerals (PRESERVISION AREDS 2+MULTI VIT PO) Take 1 tablet by mouth in the morning and at bedtime.    omeprazole (PRILOSEC) 20 MG capsule Take 1 capsule (20 mg total) by mouth daily.   rOPINIRole (REQUIP) 1 MG tablet Take 1 mg by mouth See admin instructions. Take 1 mg by mouth once a day between 3 PM-4 PM and an additional 1 mg in the evening, if no relief   spironolactone (ALDACTONE) 25 MG tablet Take 0.5 tablets (12.5 mg total) by mouth daily.   [DISCONTINUED] furosemide (LASIX) 20 MG tablet Take 1 tablet (20 mg total) by mouth daily. With an additional dose as needed for fluid overload. (Patient taking differently: Take 20 mg by mouth daily as needed for fluid or edema.)    Radiology:   CT Abdomen and Pelvis W  Contrast 12/20/2019:  1. Large Infrarenal Abdominal Aortic Aneurysm measuring up to 5.7 cm with mild perianeurysmal inflammation. Differential considerations include impending AAA rupture, mycotic AAA, symptomatic AAA. Recommend urgent transfer to emergency department and Vascular Surgery consultation. Aortic aneurysm NOS (ICD10-I71.9). Aortic Atherosclerosis (ICD10-I70.0). 2. No other acute or inflammatory process identified. 5 cm fat containing left pelvic hernia. Large bowel diverticulosis. 3. Simple appearing 4 cm left ovarian cyst, recommend follow-up Ultrasound in 6-12 months  Chest X-ray 12/20/2019:   Diffuse cardiac enlargement. Emphysematous changes in the lungs. Coarse interstitial pattern to the lungs could represent fibrosis or edema. No focal consolidation. No pleural effusions. No pneumothorax. Calcified and tortuous aorta. Degenerative changes in the shoulders and spine. Old rib fractures. Similar appearance to previous study. IMPRESSION: 1. Cardiac enlargement. 2. Coarse interstitial pattern to the lungs could represent fibrosis or edema.  Abdominal X-Ray 12/20/2019:  Interval placement of aorto iliac stent graft. Nonobstructive bowel gas pattern.  Cardiac Studies:   Lexiscan myoview stress test 12/08/2017: 1. Lexiscan stress test was performed. Exercise capacity was not assessed. Stress symptoms included abdominal pain. Blood pressure was normal. The resting and stress electrocardiogram demonstrated normal sinus rhythm, normal resting conduction, no resting arrhythmias and normal rest repolarization. 2. The overall quality of the study is good. There is no evidence of abnormal lung activity. Stress and rest SPECT images demonstrate homogeneous tracer distribution throughout the myocardium. Gated SPECT imaging reveals normal myocardial thickening and wall motion. The left ventricular ejection fraction was normal (54%). 3. Low risk study  Renal artery duplex  09/27/2017: Hemodynamically significant stenosis bilaterally. Renal length is within normal limits for both kidneys. There is an incidental 3.9x3.4x3.4 cm proximal and mid abdominal aortic aneurysm. Recommend dedicated abdominal aortic duplex scan to further define AAA.  Abdominal Aortic Duplex  01/01/2019: Moderate dilatation of the abdominal aorta is noted in the proximal, mid and distal aorta. An abdominal aortic aneurysm measuring 4.5 x 4.52 x 4.52 cm is seen.  Recheck in 6 months for stability of the aneurysm.  TEE 12/24/2019:  1. Left ventricular ejection fraction, by estimation, is 50 to 55%. The left  ventricle has low normal function. Left ventricular diastolic function could not be evaluated.   2. Right ventricular systolic function is normal. The right ventricular size is normal.   3. Left atrial size was moderately dilated. No left atrial/left atrial appendage thrombus was detected. The LAA emptying velocity was 30 cm/s.   4. The mitral valve is normal in structure. Mild mitral valve regurgitation. No evidence of mitral stenosis.   5. The aortic valve is calcified. Aortic valve regurgitation is not visualized. Mild aortic valve stenosis. Aortic valve mean gradient measures 10.8 mmHg.   6. There is Moderate (Grade III) layered plaque involving the ascending, transverse and descending aorta.   Echocardiogram 05/27/2020: Left ventricle cavity is normal in size. Moderate concentric hypertrophy of the left ventricle. Normal global wall motion. Normal LV systolic function with EF 56%. Doppler evidence of grade II (pseudonormal) diastolic dysfunction, elevated LAP.  Left atrial cavity is mildly dilated. Trileaflet aortic valve with mild aortic valve leaflet calcification. Mild aortic valve stenosis. Vmax 2.6 m/sec, mean PG 16 mmHg, AVA 1.2 cm2 by continuity equation Mild mitral valve leaflet calcification. Mild (Grade I) mitral regurgitation. Mild tricuspid regurgitation. Estimated pulmonary  artery systolic pressure 31 mmHg.  Compared to previous study on 01/25/2019, trivial change in aortic valve mean PG from 12 to 16 mmHg, PASP from 25 mmHg to 31 mmHg.   EKG   05/31/2020: Sinus rhythm with borderline first-degree AV block at rate of 61 bpm, left atrial enlargement, normal axis.  Poor R wave progression, probably normal variant but cannot exclude anteroseptal infarct old.  No evidence of ischemia, normal QT interval. No significant change from 01/02/2020.  Assessment     ICD-10-CM   1. Primary hypertension  I10 furosemide (LASIX) 20 MG tablet    2. Chronic diastolic (congestive) heart failure (HCC)  I50.32 furosemide (LASIX) 20 MG tablet    Basic metabolic panel    Brain natriuretic peptide     This patients CHA2DS2-VASc Score 6 (CHF, HTN, vasc, age, F) and yearly risk of stroke 9.8%.  Meds ordered this encounter  Medications   furosemide (LASIX) 20 MG tablet    Sig: Take 1 tablet (20 mg total) by mouth 2 (two) times daily. With an additional dose as needed for fluid overload.    Dispense:  180 tablet    Refill:  0   Medications Discontinued During This Encounter  Medication Reason   furosemide (LASIX) 20 MG tablet      Recommendations:   Lisa Mcclure  is a 87 y.o.  female  with past medical history of obesity, chronic diastolic CHF, hypertensive heart disease, paroxysmal atrial fibrillation, anemia, hemorrhoids, diverticulosis, esophageal reflux, depression, OSA intolerant to CPAP on nocturnal 02, rheumatoid arthritis, and venous insufficiency. CT scan of the chest 03/2016 without definitive ILD. LM and 2 vessel CAD, mild calcification on mitral and aortic valve. S/p AAA repair on 12/20/2019.    She had had difficulty in weight loss, difficulty in blood pressure control.  Her last episode of atrial fibrillation was in August 2021.  Her blood pressure is not well controlled.  Patient presents for urgent visit today with worsening bilateral lower leg edema.   There are no other clinical evidence of acute on chronic heart failure.  Advised patient to take Lasix 40 mg daily for 3 days, then reduce to 20 mg once daily.  We will plan to repeat BMP and BNP in 2 weeks.  As long as patient's renal function tolerates daily dosing of  Lasix, would recommend continuing furosemide 20 mg daily with additional doses as needed.  Patient verbalized understanding is in agreement with the plan.  Follow-up in 6 weeks, sooner if needed, for bilateral lower leg swelling and heart failure.   Alethia Berthold, PA-C 09/30/2020, 12:54 PM Office: [phone]

## 2020-10-08 DIAGNOSIS — I10 Essential (primary) hypertension: Secondary | ICD-10-CM | POA: Diagnosis not present

## 2020-10-13 ENCOUNTER — Other Ambulatory Visit: Payer: Self-pay | Admitting: Cardiology

## 2020-10-13 ENCOUNTER — Telehealth: Payer: Self-pay

## 2020-10-13 NOTE — Telephone Encounter (Signed)
Patient calls today to follow up on previous call about a lump in her left groin s/p aortabifem in September. Over the last couple of months, size has increased from an olive to half an apple. Over the last few days it has become a bit tender. Denies any other issues with her leg. Denies fever, chills, redness, or drainage. Discussed with PA. Patient would like to be seen prior to appt next week. Placed on APP schedule for groin check tomorrow.

## 2020-10-14 ENCOUNTER — Other Ambulatory Visit: Payer: Self-pay

## 2020-10-14 ENCOUNTER — Ambulatory Visit (INDEPENDENT_AMBULATORY_CARE_PROVIDER_SITE_OTHER): Payer: Medicare Other | Admitting: Physician Assistant

## 2020-10-14 VITALS — BP 130/72 | HR 83 | Temp 98.0°F | Resp 20 | Ht 64.0 in | Wt 210.3 lb

## 2020-10-14 DIAGNOSIS — Z8679 Personal history of other diseases of the circulatory system: Secondary | ICD-10-CM | POA: Diagnosis not present

## 2020-10-14 DIAGNOSIS — R1909 Other intra-abdominal and pelvic swelling, mass and lump: Secondary | ICD-10-CM | POA: Diagnosis not present

## 2020-10-14 DIAGNOSIS — Z9889 Other specified postprocedural states: Secondary | ICD-10-CM | POA: Diagnosis not present

## 2020-10-14 NOTE — Progress Notes (Signed)
POST OPERATIVE OFFICE NOTE    CC:  left groin lump  HPI:  This is a 87 y.o. female who is s/p  Endovascular repair of infrarenal abdominal aortic aneurysm with bifurcated stent graft (26 mm x 14 x 12 main body, right bell bottom with a 20 mm x 10 cm piece, and left bell bottom with a 16 mm x 12 cm piece) by Dr. Carlis Abbott on 12/2019. Perclose of both groins.  She initially contacted our office in May complaining of an approximately olive size lump of the left groin area.  She called yesterday stating this area is now enlarging.  She denies abdominal pain or lower extremity pain. Normal bowel function.   Current Outpatient Medications  Medication Sig Dispense Refill   amiodarone (PACERONE) 100 MG tablet Take 1 tablet (100 mg total) by mouth daily. (Patient taking differently: Take 100 mg by mouth daily. Taking it in the morning) 90 tablet 3   amLODipine (NORVASC) 10 MG tablet TAKE ONE TABLET BY MOUTH ONCE DAILY 90 tablet 1   apixaban (ELIQUIS) 5 MG TABS tablet Take 1 tablet (5 mg total) by mouth 2 (two) times daily. 60 tablet 1   Bempedoic Acid-Ezetimibe (NEXLIZET) 180-10 MG TABS Take 1 capsule by mouth daily. (Patient taking differently: Take 1 capsule by mouth daily. Taking it at bedtime) 30 tablet 3   carvedilol (COREG) 12.5 MG tablet TAKE ONE TABLET BY MOUTH AT BREAKFAST AND AT BEDTIME 180 tablet 2   Cholecalciferol (VITAMIN D3) 125 MCG (5000 UT) CAPS Take 5,000 Units by mouth daily with breakfast. Taking it at lunchtime     diclofenac sodium (VOLTAREN) 1 % GEL Apply 2.25 g topically 3 (three) times daily as needed (for pain).      furosemide (LASIX) 20 MG tablet Take 1 tablet (20 mg total) by mouth 2 (two) times daily. With an additional dose as needed for fluid overload. 180 tablet 0   HYDROcodone-acetaminophen (NORCO) 7.5-325 MG tablet Take 1 tablet by mouth every 6 (six) hours as needed for moderate pain. 20 tablet 0   isosorbide-hydrALAZINE (BIDIL) 20-37.5 MG tablet Take 1 tablet by mouth 3  (three) times daily.     latanoprost (XALATAN) 0.005 % ophthalmic solution Place 1 drop into both eyes at bedtime.     levothyroxine (SYNTHROID) 125 MCG tablet Take 125 mcg by mouth daily before breakfast.      losartan (COZAAR) 100 MG tablet Take 1 tablet (100 mg total) by mouth daily. 180 tablet 1   Multiple Vitamins-Minerals (ICAPS AREDS 2 PO) Take by mouth.     omeprazole (PRILOSEC) 20 MG capsule Take 1 capsule (20 mg total) by mouth daily. 30 capsule 11   rOPINIRole (REQUIP) 1 MG tablet Take 1 mg by mouth See admin instructions. Take 1 mg by mouth once a day between 3 PM-4 PM and an additional 1 mg in the evening, if no relief  12   spironolactone (ALDACTONE) 25 MG tablet Take 0.5 tablets (12.5 mg total) by mouth daily. 15 tablet 0   No current facility-administered medications for this visit.     ROS:  See HPI  BP 130/72 (BP Location: Left Arm, Patient Position: Sitting, Cuff Size: Normal)   Pulse 83   Temp 98 F (36.7 C) (Temporal)   Resp 20   Ht 5\' 4"  (1.626 m)   Wt 210 lb 4.8 oz (95.4 kg)   SpO2 97%   BMI 36.10 kg/m   Physical Exam: General appearance: Awake, alert in no apparent  distress Cardiac: Heart rate and rhythm are regular Respirations: Nonlabored Abdomen: Obese, nondistended, nontender.  I was unable to appreciate palpable mass in supine position, however upon standing, she has an approximately golf ball sized palpable mass of the left lower abdomen/groin area. Extremities: Both feet are warm with intact sensation and motor function.  2+ bilateral femoral and posterior tibial pulses.  No skin changes or tenderness to palpation.  Assessment/Plan:  This is a 87 y.o. female who is s/p: Endovascular repair of infrarenal abdominal aortic aneurysm in September 2021.    Palpable mass of left pubic/groin area.  Asymptomatic.  Possible left femoral hernia. She is scheduled for routine surveillance with EVAR duplex next week and to see Dr. Carlis Abbott.  This palpable area is not  consistent with left femoral artery pseudoaneurysm, however will obtain duplex of this area when she returns to the office next week.    Risa Grill, PA-C [facility] [phone]  Clinic MD:  Dr. Scot Dock

## 2020-10-15 ENCOUNTER — Other Ambulatory Visit: Payer: Self-pay

## 2020-10-15 DIAGNOSIS — R1909 Other intra-abdominal and pelvic swelling, mass and lump: Secondary | ICD-10-CM

## 2020-10-16 ENCOUNTER — Telehealth: Payer: Self-pay | Admitting: Physical Medicine and Rehabilitation

## 2020-10-16 NOTE — Telephone Encounter (Signed)
Pt called requesting a call back an appt for lower back pain. Pt phone number is [phone].

## 2020-10-19 NOTE — Telephone Encounter (Signed)
Left S1 TF on 01/29/20. Ok to repeat if helped, same problem/side, and no new injury?

## 2020-10-20 ENCOUNTER — Other Ambulatory Visit (HOSPITAL_COMMUNITY): Payer: Medicare Other

## 2020-10-20 ENCOUNTER — Ambulatory Visit: Payer: Medicare Other | Admitting: Vascular Surgery

## 2020-10-20 NOTE — Telephone Encounter (Signed)
FYI- patient also wanted repeat shoulder injection. I scheduled the injections on 2 different days.

## 2020-10-22 ENCOUNTER — Ambulatory Visit: Payer: Medicare Other | Admitting: Student

## 2020-10-26 ENCOUNTER — Other Ambulatory Visit: Payer: Self-pay

## 2020-10-26 ENCOUNTER — Ambulatory Visit (INDEPENDENT_AMBULATORY_CARE_PROVIDER_SITE_OTHER): Payer: Medicare Other | Admitting: Physical Medicine and Rehabilitation

## 2020-10-26 ENCOUNTER — Encounter: Payer: Self-pay | Admitting: Physical Medicine and Rehabilitation

## 2020-10-26 ENCOUNTER — Ambulatory Visit: Payer: Self-pay

## 2020-10-26 DIAGNOSIS — G8929 Other chronic pain: Secondary | ICD-10-CM

## 2020-10-26 DIAGNOSIS — M19011 Primary osteoarthritis, right shoulder: Secondary | ICD-10-CM | POA: Diagnosis not present

## 2020-10-26 DIAGNOSIS — M25511 Pain in right shoulder: Secondary | ICD-10-CM

## 2020-10-26 DIAGNOSIS — M7918 Myalgia, other site: Secondary | ICD-10-CM

## 2020-10-26 NOTE — Progress Notes (Signed)
  Right shoulder pain. States that last injection "did not really help." Reports that she had 50% relief for about 3 weeks.     -Dye Allergies.

## 2020-10-27 MED ORDER — BUPIVACAINE HCL 0.5 % IJ SOLN
3.0000 mL | INTRAMUSCULAR | Status: AC | PRN
  Administered 2020-10-26: 3 mL via INTRA_ARTICULAR

## 2020-10-27 MED ORDER — TRIAMCINOLONE ACETONIDE 40 MG/ML IJ SUSP
60.0000 mg | INTRAMUSCULAR | Status: AC | PRN
  Administered 2020-10-26: 60 mg via INTRA_ARTICULAR

## 2020-10-27 NOTE — Addendum Note (Signed)
Addended by: Raymondo Band on: 10/27/2020 04:12 PM   Modules accepted: Orders

## 2020-10-27 NOTE — Progress Notes (Addendum)
Lisa Mcclure - 87 y.o. female MRN [id number]  Date of birth: Jun 23, 1932  Office Visit Note: Visit Date: 10/26/2020 PCP: [name], MD Referred by: [name], MD  Subjective: Chief Complaint  Patient presents with   Right Shoulder - Pain   HPI:  Lisa Mcclure is a 87 y.o. female who comes in today For repeat right glenohumeral joint injection fluoroscopic guidance.  Patient has end-stage arthritis of the right glenohumeral joint.  She is not interested in any surgery.  Last injection did give her more than 50% relief but was not as long as lasting as the one prior.  We will try different approach today going more of a inferior approach to see if that gives her any better relief.  She also has a history of myofascial pain syndrome and trigger points in the cervical spine trapezius and levator scapula and rhomboids.  She is asked if we can provide referral to physical therapy for some manual treatment including manual trigger point release if possible.  I did place a referral to [facility] for [place].  ROS Otherwise per HPI.  Assessment & Plan: Visit Diagnoses:    ICD-10-CM   1. Chronic right shoulder pain  M25.511 XR C-ARM NO REPORT   G89.29 Large Joint Inj: R glenohumeral    bupivacaine (MARCAINE) 0.5 % (with pres) injection 3 mL    triamcinolone acetonide (KENALOG-40) injection 60 mg    Ambulatory referral to Physical Therapy    2. Arthritis of right glenohumeral joint  M19.011 Large Joint Inj: R glenohumeral    bupivacaine (MARCAINE) 0.5 % (with pres) injection 3 mL    triamcinolone acetonide (KENALOG-40) injection 60 mg    Ambulatory referral to Physical Therapy    3. Myofascial pain syndrome  M79.18 Ambulatory referral to Physical Therapy      Plan: No additional findings.   Meds & Orders:  Meds ordered this encounter  Medications   bupivacaine (MARCAINE) 0.5 % (with pres) injection 3 mL   triamcinolone acetonide (KENALOG-40)  injection 60 mg     Orders Placed This Encounter  Procedures   Large Joint Inj: R glenohumeral   XR C-ARM NO REPORT   Ambulatory referral to Physical Therapy    Follow-up: Return if symptoms worsen or fail to improve.   Procedures: Large Joint Inj: R glenohumeral on 10/26/2020 2:15 PM Indications: pain and diagnostic evaluation Details: 22 G 3.5 in needle, fluoroscopy-guided anteromedial approach  Arthrogram: No  Medications: 3 mL bupivacaine 0.5 %; 60 mg triamcinolone acetonide 40 MG/ML Outcome: tolerated well, no immediate complications  There was excellent flow of contrast producing a partial arthrogram of the glenohumeral joint. The patient did have relief of symptoms during the anesthetic phase of the injection. Procedure, treatment alternatives, risks and benefits explained, specific risks discussed. Consent was given by the patient. Immediately prior to procedure a time out was called to verify the correct patient, procedure, equipment, support staff and site/side marked as required. Patient was prepped and draped in the usual sterile fashion.         Clinical History: LUMBAR SPINE - COMPLETE 4+ VIEW   COMPARISON: Chest x-ray dated 03/05/2017.   FINDINGS: There is an age-indeterminate compression fracture of the T12 and T11 vertebral bodies. There is some mild height loss of L2 vertebral body. Degenerative changes are noted throughout the visualized lumbar spine. These are greatest at the lower lumbar segments were there is multilevel facet arthrosis and significant multilevel disc height loss.  There appears to be a grade 1-2 anterolisthesis of L5 on S1. Diffuse osteopenia is noted.   IMPRESSION: 1. Age-indeterminate height loss of the T11, T12, and L2 vertebral bodies. Correlation with point tenderness is recommended. If there is clinical concern for an acute fracture this can be further evaluated with cross-sectional imaging. 2. Multilevel degenerative changes  throughout the lumbar spine. 3. Grade 1-2 anterolisthesis of L5 on S1. 4. Osteopenia.     Electronically Signed By: Constance Holster M.D. On: 09/14/2018 19:25 ------- EXAM: PELVIS - 1-2 VIEW   COMPARISON:  None.   FINDINGS: There is no acute displaced fracture or dislocation. There is advanced osteoarthritis of the right hip. There is mild-to-moderate osteoarthritis of the left hip. There are degenerative changes of the pubic symphysis. There is osteopenia.   IMPRESSION: 1. No acute displaced fracture or dislocation. 2. Advanced osteoarthritis of the right hip. 3. Mild-to-moderate osteoarthritis of the left hip. 4. Osteopenia which limits detection of nondisplaced fractures.     Electronically Signed   By: Constance Holster M.D.   On: 09/14/2018 19:20     Objective:  VS:  HT:    WT:   BMI:     BP:   HR: bpm  TEMP: ( )  RESP:  Physical Exam   Imaging: XR C-ARM NO REPORT  Result Date: 10/26/2020 Please see Notes tab for imaging impression.

## 2020-10-28 DIAGNOSIS — I5032 Chronic diastolic (congestive) heart failure: Secondary | ICD-10-CM | POA: Diagnosis not present

## 2020-10-29 DIAGNOSIS — H43393 Other vitreous opacities, bilateral: Secondary | ICD-10-CM | POA: Diagnosis not present

## 2020-10-29 DIAGNOSIS — H353221 Exudative age-related macular degeneration, left eye, with active choroidal neovascularization: Secondary | ICD-10-CM | POA: Diagnosis not present

## 2020-10-29 DIAGNOSIS — H43813 Vitreous degeneration, bilateral: Secondary | ICD-10-CM | POA: Diagnosis not present

## 2020-10-29 DIAGNOSIS — H353114 Nonexudative age-related macular degeneration, right eye, advanced atrophic with subfoveal involvement: Secondary | ICD-10-CM | POA: Diagnosis not present

## 2020-10-29 LAB — BASIC METABOLIC PANEL
BUN/Creatinine Ratio: 32 — ABNORMAL HIGH (ref 12–28)
BUN: 38 mg/dL — ABNORMAL HIGH (ref 8–27)
CO2: 25 mmol/L (ref 20–29)
Calcium: 9.1 mg/dL (ref 8.7–10.3)
Chloride: 96 mmol/L (ref 96–106)
Creatinine, Ser: 1.2 mg/dL — ABNORMAL HIGH (ref 0.57–1.00)
Glucose: 93 mg/dL (ref 65–99)
Potassium: 4.8 mmol/L (ref 3.5–5.2)
Sodium: 140 mmol/L (ref 134–144)
eGFR: 44 mL/min/{1.73_m2} — ABNORMAL LOW (ref >59)

## 2020-10-29 LAB — BRAIN NATRIURETIC PEPTIDE: BNP: 356.5 pg/mL — ABNORMAL HIGH (ref 0.0–100.0)

## 2020-10-30 ENCOUNTER — Ambulatory Visit: Payer: Medicare Other | Admitting: Physical Therapy

## 2020-11-02 ENCOUNTER — Encounter: Payer: Self-pay | Admitting: Physical Medicine and Rehabilitation

## 2020-11-02 ENCOUNTER — Other Ambulatory Visit: Payer: Self-pay | Admitting: Pharmacist

## 2020-11-02 ENCOUNTER — Ambulatory Visit (INDEPENDENT_AMBULATORY_CARE_PROVIDER_SITE_OTHER): Payer: Medicare Other | Admitting: Physical Medicine and Rehabilitation

## 2020-11-02 ENCOUNTER — Ambulatory Visit: Payer: Self-pay

## 2020-11-02 ENCOUNTER — Other Ambulatory Visit: Payer: Self-pay

## 2020-11-02 VITALS — BP 118/57 | HR 61

## 2020-11-02 DIAGNOSIS — I5032 Chronic diastolic (congestive) heart failure: Secondary | ICD-10-CM

## 2020-11-02 DIAGNOSIS — M5416 Radiculopathy, lumbar region: Secondary | ICD-10-CM

## 2020-11-02 MED ORDER — BETAMETHASONE SOD PHOS & ACET 6 (3-3) MG/ML IJ SUSP
12.0000 mg | Freq: Once | INTRAMUSCULAR | Status: AC
  Administered 2020-11-02: 12 mg

## 2020-11-02 NOTE — Progress Notes (Signed)
Pain from center of low back to left buttock. Numeric Pain Rating Scale and Functional Assessment Average Pain 9   In the last MONTH (on 0-10 scale) has pain interfered with the following?  1. General activity like being  able to carry out your everyday physical activities such as walking, climbing stairs, carrying groceries, or moving a chair?  Rating(8)   +Driver, -BT, -Dye Allergies.

## 2020-11-02 NOTE — Progress Notes (Signed)
Renal function decreased some but BNP is also up some. Thoughts on continuing Lasix 20 mg once daily and repeating labs in another week? Have you talked to her lately about the swelling?

## 2020-11-02 NOTE — Progress Notes (Signed)
Called and discussed with pt. Pt reports lower extremity swelling still present and stable. Discussed with [name], recommended pt increase home lasix dose to 20 mg BID for 3 days to see improvement in LLE concerns. Denies any complains for SOB, DOE. Reviewed lab results with pt. Pt agreeable to get repeat labs done in 1 week. Lab orders pended.

## 2020-11-02 NOTE — Patient Instructions (Signed)

## 2020-11-03 ENCOUNTER — Ambulatory Visit: Payer: Medicare Other | Attending: Physical Medicine and Rehabilitation | Admitting: Physical Therapy

## 2020-11-03 ENCOUNTER — Encounter: Payer: Self-pay | Admitting: Physical Therapy

## 2020-11-03 DIAGNOSIS — M6281 Muscle weakness (generalized): Secondary | ICD-10-CM | POA: Diagnosis not present

## 2020-11-03 DIAGNOSIS — M25511 Pain in right shoulder: Secondary | ICD-10-CM | POA: Diagnosis not present

## 2020-11-03 DIAGNOSIS — G8929 Other chronic pain: Secondary | ICD-10-CM | POA: Diagnosis not present

## 2020-11-03 DIAGNOSIS — R293 Abnormal posture: Secondary | ICD-10-CM | POA: Diagnosis not present

## 2020-11-03 DIAGNOSIS — R252 Cramp and spasm: Secondary | ICD-10-CM | POA: Insufficient documentation

## 2020-11-03 NOTE — Patient Instructions (Addendum)
Access Code: [email or web address] URL: [email or web address] Date: 11/03/2020 Prepared by: Everardo All  Exercises Standing Isometric Shoulder Flexion with Doorway - 1 x daily - 7 x weekly - 2 sets - 10 reps - 3s hold Standing Isometric Shoulder Abduction with Doorway - 1 x daily - 7 x weekly - 2 sets - 10 reps - 3s hold Seated Shoulder Flexion Towel Slide at Table Top - 1 x daily - 7 x weekly - 1 sets - 20 reps Seated Shoulder Scaption Slide at Table Top with Forearm in Neutral - 1 x daily - 7 x weekly - 1 sets - 20 reps Seated Scapular Retraction - 1 x daily - 7 x weekly - 2 sets - 10 reps - 3s hold   Trigger Point Dry Needling  What is Trigger Point Dry Needling (DN)? DN is a physical therapy technique used to treat muscle pain and dysfunction. Specifically, DN helps deactivate muscle trigger points (muscle knots).  A thin filiform needle is used to penetrate the skin and stimulate the underlying trigger point. The goal is for a local twitch response (LTR) to occur and for the trigger point to relax. No medication of any kind is injected during the procedure.   What Does Trigger Point Dry Needling Feel Like?  The procedure feels different for each individual patient. Some patients report that they do not actually feel the needle enter the skin and overall the process is not painful. Very mild bleeding may occur. However, many patients feel a deep cramping in the muscle in which the needle was inserted. This is the local twitch response.   How Will I feel after the treatment? Soreness is normal, and the onset of soreness may not occur for a few hours. Typically this soreness does not last longer than two days.  Bruising is uncommon, however; ice can be used to decrease any possible bruising.  In rare cases feeling tired or nauseous after the treatment is normal. In addition, your symptoms may get worse before they get better, this period will typically not last longer than 24 hours.    What Can I do After My Treatment? Increase your hydration by drinking more water for the next 24 hours. You may place ice or heat on the areas treated that have become sore, however, do not use heat on inflamed or bruised areas. Heat often brings more relief post needling. You can continue your regular activities, but vigorous activity is not recommended initially after the treatment for 24 hours. DN is best combined with other physical therapy such as strengthening, stretching, and other therapies.

## 2020-11-03 NOTE — Therapy (Signed)
[facility] [address], [place], [place], [address] Phone: [phone]   Fax:  [phone]  Physical Therapy Treatment  Patient Details  Name: Lisa Mcclure MRN: [id number] Date of Birth: [birth year] Referring Provider (PT): Magnus Sinning, MD   Encounter Date: 11/03/2020   PT End of Session - 11/03/20 1602     Visit Number 1    Date for PT Re-Evaluation 12/29/20    Authorization Type Medicare    Authorization Time Period KX at 92 visits    Progress Note Due on Visit 10    PT Start Time 1447    PT Stop Time 1533    PT Time Calculation (min) 46 min    Activity Tolerance Patient tolerated treatment well    Behavior During Therapy [facility] for tasks assessed/performed             Past Medical History:  Diagnosis Date   Anemia    h/o hemorrhoidal bleeding and blood transfusion   Cardiomegaly    Chronic diastolic CHF (congestive heart failure) (HCC)    Depression    Diverticulosis    DOE (dyspnea on exertion)    Esophageal reflux    GERD (gastroesophageal reflux disease)    Hypothyroidism    Insomnia    LBP (low back pain)    OAB (overactive bladder)    Obesity    OSA (obstructive sleep apnea)    Osteoarthritis    Rheumatoid arthritis(714.0)    Shoulder pain, bilateral    Unspecified essential hypertension    Venous insufficiency     Past Surgical History:  Procedure Laterality Date   ABDOMINAL AORTIC ENDOVASCULAR STENT GRAFT N/A 12/20/2019   Procedure: Aortogram including catheter selection of aorta and bilateral iliac arteriogram, Endovascular repair of infrarenal abdominal aortic aneurysm with bifurcated stent graft (26 mm x 14 x 12 main body, right bell bottom with a 20 mm x 10 cm piece, and left bell bottom with a 16 mm x 12 cm piece) ;  Surgeon: Marty Heck, MD;  Location: [place];  Service: Vascular;  Laterality: N/A;   APPENDECTOMY  1953   bladder abduction-1996  1996   breast  biopsy Right 1980   CARDIOVERSION N/A 12/24/2019   Procedure: CARDIOVERSION;  Surgeon: Adrian Prows, MD;  Location: [facility] ENDOSCOPY;  Service: Cardiovascular;  Laterality: N/A;   [place] N/A 04/10/2015   Procedure: FLEXIBLE SIGMOIDOSCOPY;  Surgeon: Arta Silence, MD;  Location: [facility] ENDOSCOPY;  Service: Endoscopy;  Laterality: N/A;   knee arthroscopy Right 1996, 2010   KNEE ARTHROSCOPY W/ AUTOGENOUS CARTILAGE IMPLANTATION (ACI) PROCEDURE Left 1994, 1995   REFRACTIVE SURGERY  01/2020   SHOULDER SURGERY  1990   TEE WITHOUT CARDIOVERSION N/A 12/24/2019   Procedure: TRANSESOPHAGEAL ECHOCARDIOGRAM (TEE);  Surgeon: Adrian Prows, MD;  Location: [place];  Service: Cardiovascular;  Laterality: N/A;   [place] Bilateral 12/20/2019   Procedure: Ultrasound-guided access of bilateral common femoral arteries for delivery of endograft and percutaneous closure;  Surgeon: Marty Heck, MD;  Location: [place];  Service: Vascular;  Laterality: Bilateral;   VESICOVAGINAL FISTULA CLOSURE W/ [place]    There were no vitals filed for this visit.   Subjective Assessment - 11/03/20 1448     Subjective  Patient presenting due to Rt shoulder pain. She states that pain radiates across upper back to Lt shoulder. Patient states that she has tried everything but injections are no longer helping. She states that initially she tore the labrum in her Lt shoulder. States that she then fell and broke both shoulders. Has noticed that her reach is shorter and she has a lot of trigger points that cause pain.    Pertinent History HTN, history of proximal humeral fracture    Limitations Lifting;House hold activities    Patient Stated Goals to be able to reach better; to have less pain    Currently in Pain? Yes    Pain Score 4     Pain  Location Shoulder    Pain Orientation Right    Pain Descriptors / Indicators Aching    Pain Type Chronic pain    Pain Onset More than a month ago    Pain Frequency Constant                [facility] PT Assessment - 11/03/20 0001       Assessment   Medical Diagnosis M25.511,G89.29 (ICD-10-CM) - Chronic right shoulder pain  M19.011 (ICD-10-CM) - Arthritis of right glenohumeral joint  M79.18 (ICD-10-CM) - Myofascial pain syndrome    Referring Provider (PT) Magnus Sinning, MD    Onset Date/Surgical Date --   chronic   Hand Dominance Right    Prior Therapy Yes      Precautions   Precautions Fall      Balance Screen   Has the patient fallen in the past 6 months No    Has the patient had a decrease in activity level because of a fear of falling?  Yes    Is the patient reluctant to leave their home because of a fear of falling?  No      Home Ecologist residence    Home Layout One level      Prior Function   Level of Independence Independent    Leisure audio books, Netflix      Cognition   Overall Cognitive Status Within Functional Limits for tasks assessed      Observation/Other Assessments   Focus on Therapeutic Outcomes (FOTO)  55      Posture/Postural Control   Posture/Postural Control Postural limitations    Postural Limitations Forward head;Increased thoracic kyphosis;Rounded Shoulders      ROM / Strength   AROM / PROM / Strength AROM;Strength      AROM   AROM Assessment Site Shoulder    Right Shoulder Flexion 95 Degrees    Right Shoulder ABduction 50 Degrees    Right Shoulder Internal Rotation --   to greater trochanter   Right Shoulder External Rotation --   spine of scapula   Left Shoulder Flexion 90 Degrees    Left Shoulder ABduction 65 Degrees    Left Shoulder Internal Rotation --   to greater trochanter   Left Shoulder External Rotation --   spine of scapula     Strength   Strength Assessment Site Shoulder    Right  Shoulder Flexion 4-/5    Right Shoulder ABduction 4-/5    Right Shoulder Internal Rotation 2+/5    Right Shoulder External Rotation 2+/5    Left Shoulder Flexion 4-/5    Left Shoulder ABduction 4-/5    Left Shoulder Internal Rotation 4-/5    Left Shoulder External Rotation 4-/5      Palpation  Palpation comment audible crepitus with PROM into flexion, abduction, internal rotation, and external rotation                                   PT Education - 11/03/20 1547     Education Details isometric shoulder flexion, isometric shoulder abduction, flexion towel slide, scaption towel slide, seated scap squeeze    Person(s) Educated Patient    Methods Explanation;Demonstration;Tactile cues;Verbal cues;Handout    Comprehension Verbalized understanding;Verbal cues required;Returned demonstration;Tactile cues required              PT Short Term Goals - 11/03/20 1556       PT SHORT TERM GOAL #1   Title Patient will be independent with HEP for continued progression at home.    Time 4    Period Weeks    Status New    Target Date 12/01/20      PT SHORT TERM GOAL #2   Title Patient will demonstrate at least 80 degrees Rt shoulder abduction to more readily perform ADLs.    Baseline 50 degrees    Time 4    Period Weeks    Status New    Target Date 12/01/20      PT SHORT TERM GOAL #3   Title Patient will demonstrate global Rt shoulder strength of 4-/5 to more readily perform lifting and carrying tasks.    Time 4    Period Weeks    Status New    Target Date 12/01/20               PT Long Term Goals - 11/03/20 1558       PT LONG TERM GOAL #1   Title Patient will be independent with advanced HEP for long term management of symptoms post D/C.    Time 8    Period Weeks    Status New    Target Date 12/29/20      PT LONG TERM GOAL #2   Title Patient will demonstrate Rt shoulder IR to L3 to more readily performing dressing and hygiene tasks.     Baseline functionally to Rt greater trochanter    Time 8    Period Weeks    Status New    Target Date 12/29/20      PT LONG TERM GOAL #3   Title Patient will score >/= 62 on FOTO to indicate improved overall function.    Baseline 55    Time 8    Period Weeks    Status New    Target Date 12/01/20      PT LONG TERM GOAL #4   Title Patient will demonstrate 120 degrees Rt shoulder flexion and abduction for improved reaching and lifting.    Time 8    Period Weeks    Status New    Target Date 12/29/20                   Plan - 11/03/20 1550     Clinical Impression Statement Patient is an 87 y/o female referred to physical therapy services due to Rt shoulder pain. PMH includes HTN and history of proximal humerus fracture. Patient reported activity limitations include reaching above shoulder height and reaching behind back. She exhibits significant bilateral shoulder AROM impairments as patient is unable to abduct Rt shoulder past 50 degrees. She displays significant strength deficits as well. Therapist noting significant upper trap activation with  attempted reaching in any direction. Patient reporting extreme difficulty when attempting to recruit scapular retractors. She would benefit from skilled therapeutic intervention to address impairments for decreased pain and to more readily perform ADLs with dominant Rt UE.    Personal Factors and Comorbidities Age;Time since onset of injury/illness/exacerbation;Comorbidity 2    Comorbidities HTN, history of proximal humeral fracture    Examination-Activity Limitations Reach Overhead;Lift;Dressing    Examination-Participation Restrictions Meal Prep    Stability/Clinical Decision Making Stable/Uncomplicated    Clinical Decision Making Low    Rehab Potential Fair    PT Frequency 2x / week    PT Duration 8 weeks    PT Treatment/Interventions ADLs/Self Care Home Management;Cryotherapy;Electrical Stimulation;Iontophoresis '4mg'$ /ml  Dexamethasone;Moist Heat;Therapeutic activities;Therapeutic exercise;Neuromuscular re-education;Patient/family education;Manual techniques;Passive range of motion;Dry needling;Taping;Spinal Manipulations;Joint Manipulations    PT Next Visit Plan review HEP; begin AAROM and training of scapular stabilizers; cervicothoracic mobility; manual as needed    PT Home Exercise Plan Access Code: [email or web address]    Consulted and Agree with Plan of Care Patient             Patient will benefit from skilled therapeutic intervention in order to improve the following deficits and impairments:  Decreased activity tolerance, Decreased range of motion, Decreased strength, Increased muscle spasms, Impaired flexibility, Impaired UE functional use, Improper body mechanics, Postural dysfunction, Pain  Visit Diagnosis: Chronic right shoulder pain - Plan: PT plan of care cert/re-cert  Abnormal posture - Plan: PT plan of care cert/re-cert  Cramp and spasm - Plan: PT plan of care cert/re-cert  Muscle weakness (generalized) - Plan: PT plan of care cert/re-cert     Problem List Patient Active Problem List   Diagnosis Date Noted   Atrial fibrillation with RVR ([place])    Long term (current) use of anticoagulants    Long term current use of antiarrhythmic drug    AAA (abdominal aortic aneurysm) without rupture ([place]) 12/20/2019   AAA (abdominal aortic aneurysm) ([place]) 12/20/2019   Intolerance of continuous positive airway pressure (CPAP) ventilation 11/26/2019   Severe hypoxemia 08/21/2019   Severe obstructive sleep apnea 07/29/2019   Chronic obstructive pulmonary disease (HCC) 07/29/2019   RLS (restless legs syndrome) 07/29/2019   Dehydration with hyponatremia 09/13/2018   Bradycardia 09/13/2018   Glaucoma 09/13/2018   OA (osteoarthritis) of shoulder 05/16/2018   Incomplete emptying of bladder 05/17/2017   Labial adhesions 05/17/2017   Mixed stress and urge urinary incontinence 05/17/2017   Urethral sphincter  deficiency, intrinsic (ISD) 05/17/2017   Aortic stenosis, mild 01/15/2016   Bilateral carotid bruits 05/11/2015   Hyponatremia 04/19/2015   Chronic anemia 04/19/2015   Panic attack 04/19/2015   Constipation 04/19/2015   Hyperkalemia 04/17/2015   Influenza A 04/16/2015   Proximal humerus fracture    Left patella fracture    Benign essential HTN    Esophageal reflux    Chronic heart failure with preserved ejection fraction (HFpEF) (HCC)    Hypothyroidism    Lower GI bleed 04/08/2015   Hematochezia    Rectal bleeding 04/03/2015   Patellar fracture 03/06/2015   Left humeral fracture 11/04/2013   Rib fracture 11/04/2013   Hypothyroid 11/04/2013   Hypertension 11/04/2013   Right shoulder pain 11/04/2013   OSA (obstructive sleep apnea) 11/13/2008   BOOP (bronchiolitis obliterans with organizing pneumonia) ([place]) 08/25/2008   Lonn Georgia Terrion Gencarelli PT, DPT  11/03/20 4:05 PM   [facility] [address], [place], [place], [address] Phone: [phone]   Fax:  [phone]  Name: Lisa Mcclure MRN: [id number] Date of Birth: 1932/04/16

## 2020-11-04 NOTE — Progress Notes (Signed)
Lisa Mcclure - 87 y.o. female MRN [id number]  Date of birth: 1933-04-04  Office Visit Note: Visit Date: 11/02/2020 PCP: [name], MD Referred by: [name], MD  Subjective: Chief Complaint  Patient presents with   Lower Back - Pain   HPI:  Lisa Mcclure is a 87 y.o. female who comes in today For planned repeat left S1 transforaminal epidural steroid injection.  Please see our prior notes for further details and justification.  Patient's had injection previously with good relief.  This is lasted several months.  Is really the only thing that is helped her.  Depending on relief would look at updated MRI or CT scan of the lumbar spine.  She does have known osteoarthritis of the hips bilaterally.  She has no groin pain.  She also reports that the intra-articular glenohumeral joint injection that we performed a couple weeks ago has not really benefited her very much.  I did note that the position that we did that injection was not ideal.  I do think at some point it would be worth repeating that to [place] just see if we can get her some relief for several months.  Otherwise she talks about what else can be done.  Unfortunately she has had everything else done before she saw me and she realizes that we have a long discussion today with her and her daughter but there is really not much that can be done.  I offered her consultation with Dr. Marlou Sa in our office but she reports that she has seen multiple doctors for her shoulder and no surgery can be performed.  The last doctor she has been followed by is Dr. Onnie Graham.  She could be referred to to advanced interventional pain for looking at possible stimulator implant.  She could look at viscosupplementation but it would be out-of-pocket.  We do have her scheduled for physical therapy with trigger point management so maybe that will help as well.  ROS Otherwise per HPI.  Assessment & Plan: Visit Diagnoses:    ICD-10-CM   1. Lumbar  radiculopathy  M54.16 XR C-ARM NO REPORT    Epidural Steroid injection    betamethasone acetate-betamethasone sodium phosphate (CELESTONE) injection 12 mg      Plan: No additional findings.   Meds & Orders:  Meds ordered this encounter  Medications   betamethasone acetate-betamethasone sodium phosphate (CELESTONE) injection 12 mg    Orders Placed This Encounter  Procedures   XR C-ARM NO REPORT   Epidural Steroid injection    Follow-up: Return if symptoms worsen or fail to improve.   Procedures: No procedures performed  S1 Lumbosacral Transforaminal Epidural Steroid Injection - Sub-Pedicular Approach with Fluoroscopic Guidance   Patient: Lisa Mcclure      Date of Birth: 03/21/1933 MRN: [id number] PCP: [name], MD      Visit Date: 11/02/2020   Universal Protocol:    Date/Time: 07/27/226:18 AM  Consent Given By: the patient  Position:  PRONE  Additional Comments: Vital signs were monitored before and after the procedure. Patient was prepped and draped in the usual sterile fashion. The correct patient, procedure, and site was verified.   Injection Procedure Details:  Procedure Site One Meds Administered:  Meds ordered this encounter  Medications   betamethasone acetate-betamethasone sodium phosphate (CELESTONE) injection 12 mg    Laterality: Left  Location/Site:  S1 Foramen   Needle size: 22 ga.  Needle type: Spinal  Needle Placement: Transforaminal  Findings:   -Comments:  Excellent flow of contrast along the nerve, nerve root and into the epidural space.  Epidurogram: Contrast epidurogram showed no nerve root cut off or restricted flow pattern.  Procedure Details: After squaring off the sacral end-plate to get a true AP view, the C-arm was positioned so that the best possible view of the S1 foramen was visualized. The soft tissues overlying this structure were infiltrated with 2-3 ml. of 1% Lidocaine without Epinephrine.    The spinal  needle was inserted toward the target using a "trajectory" view along the fluoroscope beam.  Under AP and lateral visualization, the needle was advanced so it did not puncture dura. Biplanar projections were used to confirm position. Aspiration was confirmed to be negative for CSF and/or blood. A 1-2 ml. volume of Isovue-250 was injected and flow of contrast was noted at each level. Radiographs were obtained for documentation purposes.   After attaining the desired flow of contrast documented above, a 0.5 to 1.0 ml test dose of 0.25% Marcaine was injected into each respective transforaminal space.  The patient was observed for 90 seconds post injection.  After no sensory deficits were reported, and normal lower extremity motor function was noted,   the above injectate was administered so that equal amounts of the injectate were placed at each foramen (level) into the transforaminal epidural space.   Additional Comments:  The patient tolerated the procedure well Dressing: Band-Aid with 2 x 2 sterile gauze    Post-procedure details: Patient was observed during the procedure. Post-procedure instructions were reviewed.  Patient left the clinic in stable condition.   Clinical History: LUMBAR SPINE - COMPLETE 4+ VIEW   COMPARISON: Chest x-ray dated 03/05/2017.   FINDINGS: There is an age-indeterminate compression fracture of the T12 and T11 vertebral bodies. There is some mild height loss of L2 vertebral body. Degenerative changes are noted throughout the visualized lumbar spine. These are greatest at the lower lumbar segments were there is multilevel facet arthrosis and significant multilevel disc height loss. There appears to be a grade 1-2 anterolisthesis of L5 on S1. Diffuse osteopenia is noted.   IMPRESSION: 1. Age-indeterminate height loss of the T11, T12, and L2 vertebral bodies. Correlation with point tenderness is recommended. If there is clinical concern for an acute fracture  this can be further evaluated with cross-sectional imaging. 2. Multilevel degenerative changes throughout the lumbar spine. 3. Grade 1-2 anterolisthesis of L5 on S1. 4. Osteopenia.     Electronically Signed By: Constance Holster M.D. On: 09/14/2018 19:25 ------- EXAM: PELVIS - 1-2 VIEW   COMPARISON:  None.   FINDINGS: There is no acute displaced fracture or dislocation. There is advanced osteoarthritis of the right hip. There is mild-to-moderate osteoarthritis of the left hip. There are degenerative changes of the pubic symphysis. There is osteopenia.   IMPRESSION: 1. No acute displaced fracture or dislocation. 2. Advanced osteoarthritis of the right hip. 3. Mild-to-moderate osteoarthritis of the left hip. 4. Osteopenia which limits detection of nondisplaced fractures.     Electronically Signed   By: Constance Holster M.D.   On: 09/14/2018 19:20     Objective:  VS:  HT:    WT:   BMI:     BP:(!) 118/57  HR:61bpm  TEMP: ( )  RESP:  Physical Exam Vitals and nursing note reviewed.  Constitutional:      General: She is not in acute distress.    Appearance: Normal appearance. She is not ill-appearing.  HENT:     Head: Normocephalic and atraumatic.  Right Ear: External ear normal.     Left Ear: External ear normal.  Eyes:     Extraocular Movements: Extraocular movements intact.  Cardiovascular:     Rate and Rhythm: Normal rate.     Pulses: Normal pulses.  Pulmonary:     Effort: Pulmonary effort is normal. No respiratory distress.  Abdominal:     General: There is no distension.     Palpations: Abdomen is soft.  Musculoskeletal:        General: Tenderness present.     Cervical back: Neck supple.     Right lower leg: No edema.     Left lower leg: No edema.     Comments: Patient has good distal strength with no pain over the greater trochanters.  No clonus or focal weakness.  Skin:    Findings: No erythema, lesion or rash.  Neurological:     General: No  focal deficit present.     Mental Status: She is alert and oriented to person, place, and time.     Sensory: No sensory deficit.     Motor: No weakness or abnormal muscle tone.     Coordination: Coordination normal.  Psychiatric:        Mood and Affect: Mood normal.        Behavior: Behavior normal.     Imaging: No results found.

## 2020-11-04 NOTE — Procedures (Signed)
S1 Lumbosacral Transforaminal Epidural Steroid Injection - Sub-Pedicular Approach with Fluoroscopic Guidance   Patient: Lisa Mcclure      Date of Birth: 12-Nov-1932 MRN: [id number] PCP: [name], MD      Visit Date: 11/02/2020   Universal Protocol:    Date/Time: 07/27/226:18 AM  Consent Given By: the patient  Position:  PRONE  Additional Comments: Vital signs were monitored before and after the procedure. Patient was prepped and draped in the usual sterile fashion. The correct patient, procedure, and site was verified.   Injection Procedure Details:  Procedure Site One Meds Administered:  Meds ordered this encounter  Medications   betamethasone acetate-betamethasone sodium phosphate (CELESTONE) injection 12 mg    Laterality: Left  Location/Site:  S1 Foramen   Needle size: 22 ga.  Needle type: Spinal  Needle Placement: Transforaminal  Findings:   -Comments: Excellent flow of contrast along the nerve, nerve root and into the epidural space.  Epidurogram: Contrast epidurogram showed no nerve root cut off or restricted flow pattern.  Procedure Details: After squaring off the sacral end-plate to get a true AP view, the C-arm was positioned so that the best possible view of the S1 foramen was visualized. The soft tissues overlying this structure were infiltrated with 2-3 ml. of 1% Lidocaine without Epinephrine.    The spinal needle was inserted toward the target using a "trajectory" view along the fluoroscope beam.  Under AP and lateral visualization, the needle was advanced so it did not puncture dura. Biplanar projections were used to confirm position. Aspiration was confirmed to be negative for CSF and/or blood. A 1-2 ml. volume of Isovue-250 was injected and flow of contrast was noted at each level. Radiographs were obtained for documentation purposes.   After attaining the desired flow of contrast documented above, a 0.5 to 1.0 ml test dose of 0.25%  Marcaine was injected into each respective transforaminal space.  The patient was observed for 90 seconds post injection.  After no sensory deficits were reported, and normal lower extremity motor function was noted,   the above injectate was administered so that equal amounts of the injectate were placed at each foramen (level) into the transforaminal epidural space.   Additional Comments:  The patient tolerated the procedure well Dressing: Band-Aid with 2 x 2 sterile gauze    Post-procedure details: Patient was observed during the procedure. Post-procedure instructions were reviewed.  Patient left the clinic in stable condition.

## 2020-11-06 ENCOUNTER — Encounter: Payer: Medicare Other | Admitting: Physical Therapy

## 2020-11-09 ENCOUNTER — Other Ambulatory Visit: Payer: Self-pay | Admitting: Student

## 2020-11-09 DIAGNOSIS — I5032 Chronic diastolic (congestive) heart failure: Secondary | ICD-10-CM

## 2020-11-10 ENCOUNTER — Encounter: Payer: Self-pay | Admitting: Vascular Surgery

## 2020-11-10 ENCOUNTER — Ambulatory Visit (HOSPITAL_COMMUNITY)
Admission: RE | Admit: 2020-11-10 | Discharge: 2020-11-10 | Disposition: A | Discharge status: Home / Self Care | Payer: Medicare Other | Source: Ambulatory Visit | Attending: Vascular Surgery | Admitting: Vascular Surgery

## 2020-11-10 ENCOUNTER — Ambulatory Visit: Payer: Medicare Other | Admitting: Student

## 2020-11-10 ENCOUNTER — Ambulatory Visit (INDEPENDENT_AMBULATORY_CARE_PROVIDER_SITE_OTHER)
Admission: RE | Admit: 2020-11-10 | Discharge: 2020-11-10 | Disposition: A | Discharge status: Home / Self Care | Payer: Medicare Other | Source: Ambulatory Visit | Attending: Vascular Surgery | Admitting: Vascular Surgery

## 2020-11-10 ENCOUNTER — Other Ambulatory Visit: Payer: Self-pay

## 2020-11-10 ENCOUNTER — Ambulatory Visit (INDEPENDENT_AMBULATORY_CARE_PROVIDER_SITE_OTHER): Payer: Medicare Other | Admitting: Vascular Surgery

## 2020-11-10 VITALS — BP 184/85 | HR 60 | Temp 97.8°F | Resp 16 | Ht 63.0 in | Wt 205.0 lb

## 2020-11-10 DIAGNOSIS — R1909 Other intra-abdominal and pelvic swelling, mass and lump: Secondary | ICD-10-CM | POA: Insufficient documentation

## 2020-11-10 DIAGNOSIS — I714 Abdominal aortic aneurysm, without rupture, unspecified: Secondary | ICD-10-CM

## 2020-11-10 NOTE — Progress Notes (Signed)
Patient name: Lisa Mcclure MRN: [id number] DOB: [birth year] Sex: female  REASON FOR VISIT:. 6 month follow-up for surveillance after EVAR  HPI: Lisa Mcclure is a 87 y.o. female with multiple medical problems as noted below that presents for 6 month follow-up after endovascular stent graft for treatment of a symptomatic 5.7 cm abdominal aortic aneurysm.  I initially met her in the ED when she had new onset abdominal pain that we felt was related to her enlarging AAA.  Her aneurysm was repaired on 12/20/2019 with stent graft.  She did get a CTA on 04/14/2020 that shows good seal but she does have evidence of a type II endoleak but otherwise aneurysm has shown no signs of growth and is actually slightly smaller.    On follow-up today she is concerned about a lump in her left groin.  She was recently seen by the PA who did order a left femoral pseudoaneurysm study for today but felt this was likely a hernia.  She is also due for EVAR duplex.  Past Medical History:  Diagnosis Date   Anemia    h/o hemorrhoidal bleeding and blood transfusion   Cardiomegaly    Chronic diastolic CHF (congestive heart failure) (HCC)    Depression    Diverticulosis    DOE (dyspnea on exertion)    Esophageal reflux    GERD (gastroesophageal reflux disease)    Hypothyroidism    Insomnia    LBP (low back pain)    OAB (overactive bladder)    Obesity    OSA (obstructive sleep apnea)    Osteoarthritis    Rheumatoid arthritis(714.0)    Shoulder pain, bilateral    Unspecified essential hypertension    Venous insufficiency     Past Surgical History:  Procedure Laterality Date   ABDOMINAL AORTIC ENDOVASCULAR STENT GRAFT N/A 12/20/2019   Procedure: Aortogram including catheter selection of aorta and bilateral iliac arteriogram, Endovascular repair of infrarenal abdominal aortic aneurysm with bifurcated stent graft (26 mm x 14 x 12 main body, right bell bottom with a 20 mm x 10 cm piece, and left bell bottom  with a 16 mm x 12 cm piece) ;  Surgeon: Lisa Heck, MD;  Location: [facility];  Service: Vascular;  Laterality: N/A;   APPENDECTOMY  1953   bladder abduction-1996  1996   breast biopsy Right 1980   CARDIOVERSION N/A 12/24/2019   Procedure: CARDIOVERSION;  Surgeon: Lisa Prows, MD;  Location: [facility] ENDOSCOPY;  Service: Cardiovascular;  Laterality: N/A;   [place] N/A 04/10/2015   Procedure: FLEXIBLE SIGMOIDOSCOPY;  Surgeon: Lisa Silence, MD;  Location: [facility] ENDOSCOPY;  Service: Endoscopy;  Laterality: N/A;   knee arthroscopy Right 1996, 2010   KNEE ARTHROSCOPY W/ AUTOGENOUS CARTILAGE IMPLANTATION (ACI) PROCEDURE Left 1994, 1995   REFRACTIVE SURGERY  01/2020   SHOULDER SURGERY  1990   TEE WITHOUT CARDIOVERSION N/A 12/24/2019   Procedure: TRANSESOPHAGEAL ECHOCARDIOGRAM (TEE);  Surgeon: Lisa Prows, MD;  Location: [place];  Service: Cardiovascular;  Laterality: N/A;   [place] Bilateral 12/20/2019   Procedure: Ultrasound-guided access of bilateral common femoral arteries for delivery of endograft and percutaneous closure;  Surgeon: Lisa Heck, MD;  Location: [facility] OR;  Service: Vascular;  Laterality: Bilateral;   VESICOVAGINAL FISTULA CLOSURE W/ TAH     WRIST  SURGERY  1967    Family History  Problem Relation Age of Onset   COPD Father    Lung cancer Mother    Heart attack Maternal Grandmother 37   Breast cancer Maternal Aunt    Lung cancer Son     SOCIAL HISTORY: Social History   Tobacco Use   Smoking status: Former    Packs/day: 0.20    Years: 10.00    Pack years: 2.00    Types: Cigarettes    Quit date: 04/11/1984    Years since quitting: 36.6   Smokeless tobacco: Never  Substance Use Topics   Alcohol use: Yes    Alcohol/week: 0.0 standard drinks    Comment: cocktail once a week    Allergies   Allergen Reactions   Statins Other (See Comments)    Muscle aches and INTERNAL BLEEDING   Gabapentin Other (See Comments)    "Made me loopy"   Methocarbamol Other (See Comments)    "Made me loopy"    Current Outpatient Medications  Medication Sig Dispense Refill   amiodarone (PACERONE) 100 MG tablet Take 1 tablet (100 mg total) by mouth daily. (Patient taking differently: Take 100 mg by mouth daily. Taking it in the morning) 90 tablet 3   amLODipine (NORVASC) 10 MG tablet TAKE ONE TABLET BY MOUTH AT NOON 90 tablet 1   apixaban (ELIQUIS) 5 MG TABS tablet Take 1 tablet (5 mg total) by mouth 2 (two) times daily. 60 tablet 1   Bempedoic Acid-Ezetimibe (NEXLIZET) 180-10 MG TABS Take 1 capsule by mouth daily. (Patient taking differently: Take 1 capsule by mouth daily. Taking it at bedtime) 30 tablet 3   carvedilol (COREG) 12.5 MG tablet TAKE ONE TABLET BY MOUTH AT BREAKFAST AND AT BEDTIME 180 tablet 2   Cholecalciferol (VITAMIN D3) 125 MCG (5000 UT) CAPS Take 5,000 Units by mouth daily with breakfast. Taking it at lunchtime     diclofenac sodium (VOLTAREN) 1 % GEL Apply 2.25 g topically 3 (three) times daily as needed (for pain).      furosemide (LASIX) 20 MG tablet Take 1 tablet (20 mg total) by mouth 2 (two) times daily. With an additional dose as needed for fluid overload. 180 tablet 0   HYDROcodone-acetaminophen (NORCO) 7.5-325 MG tablet Take 1 tablet by mouth every 6 (six) hours as needed for moderate pain. 20 tablet 0   isosorbide-hydrALAZINE (BIDIL) 20-37.5 MG tablet Take 1 tablet by mouth 3 (three) times daily.     latanoprost (XALATAN) 0.005 % ophthalmic solution Place 1 drop into both eyes at bedtime.     levothyroxine (SYNTHROID) 125 MCG tablet Take 125 mcg by mouth daily before breakfast.      losartan (COZAAR) 100 MG tablet Take 1 tablet (100 mg total) by mouth daily. 180 tablet 1   Multiple Vitamins-Minerals (ICAPS AREDS 2 PO) Take by mouth.     omeprazole (PRILOSEC) 20 MG capsule  Take 1 capsule (20 mg total) by mouth daily. 30 capsule 11   rOPINIRole (REQUIP) 1 MG tablet Take 1 mg by mouth See admin instructions. Take 1 mg by mouth once a day between 3 PM-4 PM and an additional 1 mg in the evening, if no relief  12   spironolactone (ALDACTONE) 25 MG tablet TAKE 1/2 TABLET BY MOUTH ONCE DAILY 15 tablet 3   No current facility-administered medications for this visit.    REVIEW OF SYSTEMS:  [X]  denotes positive finding, [ ]  denotes negative finding Cardiac  Comments:  Chest pain  or chest pressure:    Shortness of breath upon exertion:    Short of breath when lying flat:    Irregular heart rhythm:        Vascular    Pain in calf, thigh, or hip brought on by ambulation:    Pain in feet at night that wakes you up from your sleep:     Blood clot in your veins:    Leg swelling:         Pulmonary    Oxygen at home:    Productive cough:     Wheezing:         Neurologic    Sudden weakness in arms or legs:     Sudden numbness in arms or legs:     Sudden onset of difficulty speaking or slurred speech:    Temporary loss of vision in one eye:     Problems with dizziness:         Gastrointestinal    Blood in stool:     Vomited blood:         Genitourinary    Burning when urinating:     Blood in urine:        Psychiatric    Major depression:         Hematologic    Bleeding problems:    Problems with blood clotting too easily:        Skin    Rashes or ulcers:        Constitutional    Fever or chills:      PHYSICAL EXAM: Vitals:   11/10/20 0850  BP: (!) 184/85  Pulse: 60  Resp: 16  Temp: 97.8 F (36.6 C)  TempSrc: Temporal  SpO2: 95%  Weight: 205 lb (93 kg)  Height: 5' 3"  (1.6 m)    GENERAL: The patient is a well-nourished female, in no acute distress. The vital signs are documented above. CARDIAC: There is a regular rate and rhythm.  VASCULAR:  Palpable femoral pulses bilaterally Palpable PT pulses bilaterally Left groin with no sign of  hematoma or pseudoaneurysm - appears to be likely either groin or incisional hernia and was reducible ABDOMEN: soft, NT, ND.  DATA:   Left groin pseudoaneurysm study with no evidence of pseudoaneurysm or AV fistula.  EVAR duplex shows the abdominal aortic aneurysm continues to decrease in size now 5.2 from 5.5 cm 6 months ago and no evidence of endoleak.  Assessment/Plan:  87 year old female presents for follow-up and ongoing surveillance after endovascular stent graft of her infrarenal abdominal aorta for a 5.7 cm symptomatic AAA on 12/20/2019.  Discussed that her EVAR duplex shows that her aneurysm continues to decrease in size which is reassuring.  This now measures 5.2 cm down from 5.5 cm on 04/21/2020 approximately 6 months ago.  No evidence of endoleak.  Also discussed that her left groin ultrasound shows no evidence of pseudoaneurysm.  I suspect this is either a groin hernia or incisional hernia.  I offered referral to general surgery but she deferred.  She did have questions about the previous 3.8 cm left ovarian cyst on her CT in January.  I previously discussed referring her to OB/GYN and she wanted to discuss with her PCP.  Sounds like she really has not had a chance to discuss with PCP so again offered to make that referral today.   Lisa Heck, MD [facility] Office: [phone]

## 2020-11-12 ENCOUNTER — Ambulatory Visit: Payer: Medicare Other | Attending: Physical Medicine and Rehabilitation | Admitting: Physical Therapy

## 2020-11-12 ENCOUNTER — Telehealth: Payer: Self-pay | Admitting: Physical Therapy

## 2020-11-12 DIAGNOSIS — I5032 Chronic diastolic (congestive) heart failure: Secondary | ICD-10-CM | POA: Diagnosis not present

## 2020-11-12 DIAGNOSIS — R293 Abnormal posture: Secondary | ICD-10-CM | POA: Insufficient documentation

## 2020-11-12 DIAGNOSIS — M6281 Muscle weakness (generalized): Secondary | ICD-10-CM | POA: Insufficient documentation

## 2020-11-12 DIAGNOSIS — R252 Cramp and spasm: Secondary | ICD-10-CM | POA: Insufficient documentation

## 2020-11-12 DIAGNOSIS — G8929 Other chronic pain: Secondary | ICD-10-CM | POA: Insufficient documentation

## 2020-11-12 DIAGNOSIS — M25511 Pain in right shoulder: Secondary | ICD-10-CM | POA: Insufficient documentation

## 2020-11-12 NOTE — Telephone Encounter (Signed)
Called regarding missed appt.  She thought her appt was for tomorrow.  Office staff will call her to reschedule.

## 2020-11-13 DIAGNOSIS — I10 Essential (primary) hypertension: Secondary | ICD-10-CM | POA: Diagnosis not present

## 2020-11-13 LAB — BASIC METABOLIC PANEL
BUN/Creatinine Ratio: 28 (ref 12–28)
BUN: 43 mg/dL — ABNORMAL HIGH (ref 8–27)
CO2: 27 mmol/L (ref 20–29)
Calcium: 8.8 mg/dL (ref 8.7–10.3)
Chloride: 100 mmol/L (ref 96–106)
Creatinine, Ser: 1.53 mg/dL — ABNORMAL HIGH (ref 0.57–1.00)
Glucose: 93 mg/dL (ref 65–99)
Potassium: 4.8 mmol/L (ref 3.5–5.2)
Sodium: 140 mmol/L (ref 134–144)
eGFR: 33 mL/min/{1.73_m2} — ABNORMAL LOW (ref >59)

## 2020-11-14 NOTE — Progress Notes (Signed)
You are seeing her soon

## 2020-11-16 ENCOUNTER — Other Ambulatory Visit: Payer: Self-pay

## 2020-11-16 ENCOUNTER — Ambulatory Visit: Payer: Medicare Other | Admitting: Student

## 2020-11-16 ENCOUNTER — Encounter: Payer: Self-pay | Admitting: Student

## 2020-11-16 DIAGNOSIS — I5032 Chronic diastolic (congestive) heart failure: Secondary | ICD-10-CM

## 2020-11-16 DIAGNOSIS — I10 Essential (primary) hypertension: Secondary | ICD-10-CM | POA: Diagnosis not present

## 2020-11-16 MED ORDER — FUROSEMIDE 20 MG PO TABS: 20.0000 mg | ORAL_TABLET | Freq: Every day | ORAL | 3 refills | Status: DC | PRN

## 2020-11-16 NOTE — Progress Notes (Signed)
 Primary Physician/Referring:  Paterson, Daniel, MD  [name], female    DOB: 10/09/1932, 87 y.o.   MRN: [id number]  Chief Complaint  Patient presents with   Hypertension   Follow-up   Leg Swelling   Congestive Heart Failure   HPI:    Lisa Mcclure  is a 87 y.o.  female  with past medical history of obesity, chronic diastolic CHF, hypertensive heart disease, paroxysmal atrial fibrillation, anemia, hemorrhoids, diverticulosis, esophageal reflux, depression, OSA intolerant to CPAP on nocturnal 02, rheumatoid arthritis, and venous insufficiency. CT scan of the chest 03/2016 without definitive ILD. LM and 2 vessel CAD, mild calcification on mitral and aortic valve. S/p AAA repair on 12/20/2019.  Her last episode of atrial fibrillation was in August 2021.    Patient was seen by Dr. Clark (vascular surgery) on 11/10/2020, AAA is down to 5.2 cm from 5.5 no evidence of endovascular leak.  She will continue to follow with vascular surgery for ongoing surveillance.  She now presents to our office for 6-week follow-up.  Patient has been taking Lasix 20 mg twice daily whenever possible.  However she expresses significant frustration as taking Lasix limits her ability to leave the house and do the things she enjoys with her friends and family.  Therefore patient has been skipping second dose of Lasix when she has plants outside the home.  Patient's bilateral lower leg edema significantly improved.  Denies chest pain, dyspnea, orthopnea, PND.  Notably her BNP has improved at last check, however her serum creatinine is mildly increased.  Past Medical History:  Diagnosis Date   Anemia    h/o hemorrhoidal bleeding and blood transfusion   Cardiomegaly    Chronic diastolic CHF (congestive heart failure) (HCC)    Depression    Diverticulosis    DOE (dyspnea on exertion)    Esophageal reflux    GERD (gastroesophageal reflux disease)    Hypothyroidism    Insomnia    LBP (low back  pain)    OAB (overactive bladder)    Obesity    OSA (obstructive sleep apnea)    Osteoarthritis    Rheumatoid arthritis(714.0)    Shoulder pain, bilateral    Unspecified essential hypertension    Venous insufficiency    Past Surgical History:  Procedure Laterality Date   ABDOMINAL AORTIC ENDOVASCULAR STENT GRAFT N/A 12/20/2019   Procedure: Aortogram including catheter selection of aorta and bilateral iliac arteriogram, Endovascular repair of infrarenal abdominal aortic aneurysm with bifurcated stent graft (26 mm x 14 x 12 main body, right bell bottom with a 20 mm x 10 cm piece, and left bell bottom with a 16 mm x 12 cm piece) ;  Surgeon: Clark, Christopher J, MD;  Location: [facility] OR;  Service: Vascular;  Laterality: N/A;   APPENDECTOMY  1953   bladder abduction-1996  1996   breast biopsy Right 1980   CARDIOVERSION N/A 12/24/2019   Procedure: CARDIOVERSION;  Surgeon: Ganji, Jay, MD;  Location: [facility];  Service: Cardiovascular;  Laterality: N/A;   CESAREAN SECTION     1957, 1961, 1964   COSMETIC SURGERY  1996   CYSTOCELE REPAIR     FLEXIBLE SIGMOIDOSCOPY N/A 04/10/2015   Procedure: FLEXIBLE SIGMOIDOSCOPY;  Surgeon: William Outlaw, MD;  Location: [facility];  Service: Endoscopy;  Laterality: N/A;   knee arthroscopy Right 1996, 2010   KNEE ARTHROSCOPY W/ AUTOGENOUS CARTILAGE IMPLANTATION (ACI) PROCEDURE Left 1994, 1995   REFRACTIVE SURGERY  01/2020   SHOULDER SURGERY  1990     TEE WITHOUT CARDIOVERSION N/A 12/24/2019   Procedure: TRANSESOPHAGEAL ECHOCARDIOGRAM (TEE);  Surgeon: Adrian Prows, MD;  Location: [facility];  Service: Cardiovascular;  Laterality: N/A;   [place] Bilateral 12/20/2019   Procedure: Ultrasound-guided access of bilateral common femoral arteries for delivery of endograft and percutaneous closure;  Surgeon: Marty Heck, MD;  Location: [facility] OR;  Service: Vascular;  Laterality: Bilateral;    VESICOVAGINAL FISTULA CLOSURE W/ TAH     WRIST SURGERY  1967   Family History  Problem Relation Age of Onset   COPD Father    Lung cancer Mother    Heart attack Maternal Grandmother 103   Breast cancer Maternal Aunt    Lung cancer Son     Social History   Tobacco Use   Smoking status: Former    Packs/day: 0.20    Years: 10.00    Pack years: 2.00    Types: Cigarettes    Quit date: 04/11/1984    Years since quitting: 36.6   Smokeless tobacco: Never  Substance Use Topics   Alcohol use: Yes    Alcohol/week: 0.0 standard drinks    Comment: cocktail once a week   Marital Status: Widowed  ROS  Review of Systems  Cardiovascular:  Positive for dyspnea on exertion (improving) and leg swelling (improved). Negative for chest pain, orthopnea, palpitations, paroxysmal nocturnal dyspnea and syncope.  Musculoskeletal:  Positive for back pain and joint pain (bilateral knees).  Genitourinary:  Positive for bladder incontinence and urgency.  Objective  Blood pressure (!) 122/50, pulse 67, temperature 97.8 F (36.6 C), height 5' 3" (1.6 m), weight 206 lb 9.6 oz (93.7 kg), SpO2 96 %.  Vitals with BMI 11/16/2020 11/10/2020 11/02/2020  Height 5' 3" 5' 3" -  Weight 206 lbs 10 oz 205 lbs -  BMI 86.57 84.69 -  Systolic 629 528 413  Diastolic 50 85 57  Pulse 67 60 61     Physical Exam Vitals reviewed.  Constitutional:      General: She is not in acute distress.    Appearance: She is well-developed.  HENT:     Head: Normocephalic and atraumatic.  Cardiovascular:     Rate and Rhythm: Normal rate and regular rhythm.     Pulses: Intact distal pulses.          Carotid pulses are 2+ on the right side and 2+ on the left side.      Radial pulses are 2+ on the right side and 2+ on the left side.     Heart sounds: S1 normal and S2 normal. Murmur heard.  Harsh midsystolic murmur is present with a grade of 3/6 at the upper right sternal border radiating to the apex.    No gallop.     Comments: No JVD.   Pulmonary:     Effort: Pulmonary effort is normal. No accessory muscle usage or respiratory distress.     Breath sounds: No wheezing or rhonchi.  Musculoskeletal:        General: Tenderness present.     Right lower leg: Edema (1+ pitting) present.     Left lower leg: Edema (1+ pitting) present.     Comments: Limited range of motion, bilateral shoulders  Neurological:     Mental Status: She is alert.   Laboratory examination:   Recent Labs    12/24/19 0627 12/25/19 0339 04/30/20 1200 06/08/20 1607 08/11/20 1525 10/28/20 1619 11/12/20 1555  NA 134* 134*  137   < > 139 140 140  K 4.2 3.9 4.4   < > 4.9 4.8 4.8  CL 97* 97* 96   < > 99 96 100  CO2 _0 < > _1 GLUCOSE 109* 100* 91   < > 88 93 93  BUN 19 18 56*   < > 25 38* 43*  CREATININE 0.89 0.87 1.35*   < > 1.15* 1.20* 1.53*  CALCIUM 8.4* 8.4* 9.0   < > 9.0 9.1 8.8  GFRNONAA 60* >60 36*  --   --   --   --   GFRAA >60 >60 41*  --   --   --   --    < > = values in this interval not displayed.   estimated creatinine clearance is 29.2 mL/min (A) (by C-G formula based on SCr of 1.53 mg/dL (H)).  CMP Latest Ref Rng & Units 11/12/2020 10/28/2020 08/11/2020  Glucose 65 - 99 mg/dL 93 93 88  BUN 8 - 27 mg/dL 43(H) 38(H) 25  Creatinine 0.57 - 1.00 mg/dL 1.53(H) 1.20(H) 1.15(H)  Sodium 134 - 144 mmol/L 140 140 139  Potassium 3.5 - 5.2 mmol/L 4.8 4.8 4.9  Chloride 96 - 106 mmol/L 100 96 99  CO2 20 - 29 mmol/L _2 Calcium 8.7 - 10.3 mg/dL 8.8 9.1 9.0  Total Protein 6.5 - 8.1 g/dL - - -  Total Bilirubin 0.3 - 1.2 mg/dL - - -  Alkaline Phos 38 - 126 U/L - - -  AST 15 - 41 U/L - - -  ALT 0 - 44 U/L - - -   CBC Latest Ref Rng & Units 08/04/2020 12/25/2019 12/24/2019  WBC 3.4 - 10.8 x10E3/uL 6.6 8.8 10.5  Hemoglobin 11.1 - 15.9 g/dL 11.8 9.1(L) 9.1(L)  Hematocrit 34.0 - 46.6 % 36.2 30.7(L) 30.0(L)  Platelets 150 - 450 x10E3/uL 393 309 317   Lipid Panel    Component Value Date/Time   CHOL 126 03/14/2019 1121   TRIG 82  03/14/2019 1121   HDL 49 03/14/2019 1121   CHOLHDL 5.1 CALC 12/19/2006 0916   VLDL 26 12/19/2006 0916   LDLCALC 61 03/14/2019 1121   LDLDIRECT 159.2 12/19/2006 0916   HEMOGLOBIN A1C No results found for: HGBA1C, MPG TSH Recent Labs    12/23/19 1424  TSH 0.724   External labs:   07/20/2020: Glucose 84, BUN 30, creatinine 1.22, GFR 48, sodium 137, potassium 4.9,  BNP 321  Cholesterol, total 164.000 m 06/21/2019 HDL 46 MG/DL 06/21/2019 LDL 99.000 mg 06/21/2019 Triglycerides 93.000 06/21/2019  04/14/2020: Serum glucose 86 mg, BUN 22, creatinine 1.13, EGFR 44 mL, sodium 138, potassium 4.3.  CMP otherwise normal. Hb 10.2/HCT 31.5, platelets 383.  02/17/2020:  Hemoglobin 11.1, hematocrit 34.2, platelets 383 BUN 26, creatinine 0.9, EGFR 59, sodium 136, potassium 4.6  01/07/2020: BUN 29, GFR 57, creatinine 0.93, BUN/CR 31, sodium 133, potassium 4.6, chloride 93, BMP otherwise normal  Cholesterol, total 164.000 m 06/21/2019 Triglycerides 93.000 06/21/2019 HDL 46 MG/DL 06/21/2019 LDL 99.000 mg 06/21/2019  Glucose Random 87.000 mg 06/21/2019 BUN 25.000 mg 06/21/2019 Creatinine, Serum 0.800 mg/ 06/21/2019  TSH 3.220 micr 06/21/2019  AST 18, ALT 11 09/14/2018  FOBT: Normal 04/24/2018  MicroAlbumin Urine 13.000 03/23/2017 MicroAlbumin/Creat 37.9 MG/ 03/23/2017 Allergies   Allergies  Allergen Reactions   Statins Other (See Comments)    Muscle aches and INTERNAL BLEEDING   Gabapentin Other (See Comments)    "Made me loopy"  Methocarbamol Other (See Comments)    "Made me loopy"      Medications Prior to Visit:   Outpatient Medications Prior to Visit  Medication Sig Dispense Refill   amiodarone (PACERONE) 100 MG tablet Take 1 tablet (100 mg total) by mouth daily. (Patient taking differently: Take 100 mg by mouth daily. Taking it in the morning) 90 tablet 3   amLODipine (NORVASC) 10 MG tablet TAKE ONE TABLET BY MOUTH AT NOON 90 tablet 1   apixaban (ELIQUIS) 5 MG TABS tablet  Take 1 tablet (5 mg total) by mouth 2 (two) times daily. 60 tablet 1   Bempedoic Acid-Ezetimibe (NEXLIZET) 180-10 MG TABS Take 1 capsule by mouth daily. (Patient taking differently: Take 1 capsule by mouth daily. Taking it at bedtime) 30 tablet 3   carvedilol (COREG) 12.5 MG tablet TAKE ONE TABLET BY MOUTH AT BREAKFAST AND AT BEDTIME 180 tablet 2   Cholecalciferol (VITAMIN D3) 125 MCG (5000 UT) CAPS Take 5,000 Units by mouth daily with breakfast. Taking it at lunchtime     diclofenac sodium (VOLTAREN) 1 % GEL Apply 2.25 g topically 3 (three) times daily as needed (for pain).      HYDROcodone-acetaminophen (NORCO) 7.5-325 MG tablet Take 1 tablet by mouth every 6 (six) hours as needed for moderate pain. 20 tablet 0   isosorbide-hydrALAZINE (BIDIL) 20-37.5 MG tablet Take 1 tablet by mouth 3 (three) times daily.     latanoprost (XALATAN) 0.005 % ophthalmic solution Place 1 drop into both eyes at bedtime.     levothyroxine (SYNTHROID) 125 MCG tablet Take 125 mcg by mouth daily before breakfast.      losartan (COZAAR) 100 MG tablet Take 1 tablet (100 mg total) by mouth daily. 180 tablet 1   Multiple Vitamins-Minerals (ICAPS AREDS 2 PO) Take by mouth.     omeprazole (PRILOSEC) 20 MG capsule Take 1 capsule (20 mg total) by mouth daily. 30 capsule 11   rOPINIRole (REQUIP) 1 MG tablet Take 1 mg by mouth See admin instructions. Take 1 mg by mouth once a day between 3 PM-4 PM and an additional 1 mg in the evening, if no relief  12   spironolactone (ALDACTONE) 25 MG tablet TAKE 1/2 TABLET BY MOUTH ONCE DAILY 15 tablet 3   furosemide (LASIX) 20 MG tablet Take 1 tablet (20 mg total) by mouth 2 (two) times daily. With an additional dose as needed for fluid overload. 180 tablet 0   No facility-administered medications prior to visit.     Final Medications at End of Visit    Current Meds  Medication Sig   amiodarone (PACERONE) 100 MG tablet Take 1 tablet (100 mg total) by mouth daily. (Patient taking  differently: Take 100 mg by mouth daily. Taking it in the morning)   amLODipine (NORVASC) 10 MG tablet TAKE ONE TABLET BY MOUTH AT NOON   apixaban (ELIQUIS) 5 MG TABS tablet Take 1 tablet (5 mg total) by mouth 2 (two) times daily.   Bempedoic Acid-Ezetimibe (NEXLIZET) 180-10 MG TABS Take 1 capsule by mouth daily. (Patient taking differently: Take 1 capsule by mouth daily. Taking it at bedtime)   carvedilol (COREG) 12.5 MG tablet TAKE ONE TABLET BY MOUTH AT BREAKFAST AND AT BEDTIME   Cholecalciferol (VITAMIN D3) 125 MCG (5000 UT) CAPS Take 5,000 Units by mouth daily with breakfast. Taking it at lunchtime   diclofenac sodium (VOLTAREN) 1 % GEL Apply 2.25 g topically 3 (three) times daily as needed (for pain).    HYDROcodone-acetaminophen (  NORCO) 7.5-325 MG tablet Take 1 tablet by mouth every 6 (six) hours as needed for moderate pain.   isosorbide-hydrALAZINE (BIDIL) 20-37.5 MG tablet Take 1 tablet by mouth 3 (three) times daily.   latanoprost (XALATAN) 0.005 % ophthalmic solution Place 1 drop into both eyes at bedtime.   levothyroxine (SYNTHROID) 125 MCG tablet Take 125 mcg by mouth daily before breakfast.    losartan (COZAAR) 100 MG tablet Take 1 tablet (100 mg total) by mouth daily.   Multiple Vitamins-Minerals (ICAPS AREDS 2 PO) Take by mouth.   omeprazole (PRILOSEC) 20 MG capsule Take 1 capsule (20 mg total) by mouth daily.   rOPINIRole (REQUIP) 1 MG tablet Take 1 mg by mouth See admin instructions. Take 1 mg by mouth once a day between 3 PM-4 PM and an additional 1 mg in the evening, if no relief   spironolactone (ALDACTONE) 25 MG tablet TAKE 1/2 TABLET BY MOUTH ONCE DAILY   [DISCONTINUED] furosemide (LASIX) 20 MG tablet Take 1 tablet (20 mg total) by mouth 2 (two) times daily. With an additional dose as needed for fluid overload.    Radiology:   CT Abdomen and Pelvis W Contrast 12/20/2019:  1. Large Infrarenal Abdominal Aortic Aneurysm measuring up to 5.7 cm with mild perianeurysmal  inflammation. Differential considerations include impending AAA rupture, mycotic AAA, symptomatic AAA. Recommend urgent transfer to emergency department and Vascular Surgery consultation. Aortic aneurysm NOS (ICD10-I71.9). Aortic Atherosclerosis (ICD10-I70.0). 2. No other acute or inflammatory process identified. 5 cm fat containing left pelvic hernia. Large bowel diverticulosis. 3. Simple appearing 4 cm left ovarian cyst, recommend follow-up Ultrasound in 6-12 months  Chest X-ray 12/20/2019:   Diffuse cardiac enlargement. Emphysematous changes in the lungs. Coarse interstitial pattern to the lungs could represent fibrosis or edema. No focal consolidation. No pleural effusions. No pneumothorax. Calcified and tortuous aorta. Degenerative changes in the shoulders and spine. Old rib fractures. Similar appearance to previous study. IMPRESSION: 1. Cardiac enlargement. 2. Coarse interstitial pattern to the lungs could represent fibrosis or edema.  Abdominal X-Ray 12/20/2019:  Interval placement of aorto iliac stent graft. Nonobstructive bowel gas pattern.  Cardiac Studies:   Lexiscan myoview stress test 12/08/2017: 1. Lexiscan stress test was performed. Exercise capacity was not assessed. Stress symptoms included abdominal pain. Blood pressure was normal. The resting and stress electrocardiogram demonstrated normal sinus rhythm, normal resting conduction, no resting arrhythmias and normal rest repolarization. 2. The overall quality of the study is good. There is no evidence of abnormal lung activity. Stress and rest SPECT images demonstrate homogeneous tracer distribution throughout the myocardium. Gated SPECT imaging reveals normal myocardial thickening and wall motion. The left ventricular ejection fraction was normal (54%). 3. Low risk study  Renal artery duplex 09/27/2017: Hemodynamically significant stenosis bilaterally. Renal length is within normal limits for both kidneys. There is an  incidental 3.9x3.4x3.4 cm proximal and mid abdominal aortic aneurysm. Recommend dedicated abdominal aortic duplex scan to further define AAA.  Abdominal Aortic Duplex  01/01/2019: Moderate dilatation of the abdominal aorta is noted in the proximal, mid and distal aorta. An abdominal aortic aneurysm measuring 4.5 x 4.52 x 4.52 cm is seen.  Recheck in 6 months for stability of the aneurysm.  TEE 12/24/2019:  1. Left ventricular ejection fraction, by estimation, is 50 to 55%. The left ventricle has low normal function. Left ventricular diastolic function could not be evaluated.   2. Right ventricular systolic function is normal. The right ventricular size is normal.   3. Left atrial size was   moderately dilated. No left atrial/left atrial appendage thrombus was detected. The LAA emptying velocity was 30 cm/s.   4. The mitral valve is normal in structure. Mild mitral valve regurgitation. No evidence of mitral stenosis.   5. The aortic valve is calcified. Aortic valve regurgitation is not visualized. Mild aortic valve stenosis. Aortic valve mean gradient measures 10.8 mmHg.   6. There is Moderate (Grade III) layered plaque involving the ascending, transverse and descending aorta.   Echocardiogram 05/27/2020: Left ventricle cavity is normal in size. Moderate concentric hypertrophy of the left ventricle. Normal global wall motion. Normal LV systolic function with EF 56%. Doppler evidence of grade II (pseudonormal) diastolic dysfunction, elevated LAP.  Left atrial cavity is mildly dilated. Trileaflet aortic valve with mild aortic valve leaflet calcification. Mild aortic valve stenosis. Vmax 2.6 m/sec, mean PG 16 mmHg, AVA 1.2 cm2 by continuity equation Mild mitral valve leaflet calcification. Mild (Grade I) mitral regurgitation. Mild tricuspid regurgitation. Estimated pulmonary artery systolic pressure 31 mmHg.  Compared to previous study on 01/25/2019, trivial change in aortic valve mean PG from 12 to 16  mmHg, PASP from 25 mmHg to 31 mmHg.   EKG   05/31/2020: Sinus rhythm with borderline first-degree AV block at rate of 61 bpm, left atrial enlargement, normal axis.  Poor R wave progression, probably normal variant but cannot exclude anteroseptal infarct old.  No evidence of ischemia, normal QT interval. No significant change from 01/02/2020.  Assessment     ICD-10-CM   1. Primary hypertension  I10 furosemide (LASIX) 20 MG tablet    2. Chronic diastolic (congestive) heart failure (HCC)  I50.32 furosemide (LASIX) 20 MG tablet     This patients CHA2DS2-VASc Score 6 (CHF, HTN, vasc, age, F) and yearly risk of stroke 9.8%.  Meds ordered this encounter  Medications   furosemide (LASIX) 20 MG tablet    Sig: Take 1 tablet (20 mg total) by mouth daily as needed for fluid or edema. Take Lasix 20 mg once daily with an additional dose as needed for fluid overload.    Dispense:  90 tablet    Refill:  3   Medications Discontinued During This Encounter  Medication Reason   furosemide (LASIX) 20 MG tablet Reorder     Recommendations:   Lisa Mcclure  is a 87 y.o.  female  with past medical history of obesity, chronic diastolic CHF, hypertensive heart disease, paroxysmal atrial fibrillation, anemia, hemorrhoids, diverticulosis, esophageal reflux, depression, OSA intolerant to CPAP on nocturnal 02, rheumatoid arthritis, and venous insufficiency. CT scan of the chest 03/2016 without definitive ILD. LM and 2 vessel CAD, mild calcification on mitral and aortic valve. S/p AAA repair on 12/20/2019.    Patient presents for 6-week follow-up of bilateral lower leg swelling.  Swelling has significantly improved, however patient's renal function has decreased mildly and she is expressing significant frustration in Lasix limited her ability to leave the house and negatively impacting her quality of life. Will therefore reduce Lasix to 20 mg once daily with additional doses only as needed. Patient has upcoming  appointment with PCP, will request labs be sent to our office to follow renal function.   Blood pressure is well controlled. There is no clinical evidence of acute decompensated heart failure at this time. Patient is otherwise relatively stable from cardiovascular standpoint. Follow up in 3 months, sooner if needed, for heart failure, atrial fib, AAA.    [name] C Cantwell, PA-C 11/17/2020, 8:58 AM Office: [phone] 

## 2020-11-17 ENCOUNTER — Ambulatory Visit: Payer: Medicare Other | Admitting: Physical Therapy

## 2020-11-17 DIAGNOSIS — R252 Cramp and spasm: Secondary | ICD-10-CM

## 2020-11-17 DIAGNOSIS — R293 Abnormal posture: Secondary | ICD-10-CM

## 2020-11-17 DIAGNOSIS — M6281 Muscle weakness (generalized): Secondary | ICD-10-CM

## 2020-11-17 DIAGNOSIS — M25511 Pain in right shoulder: Secondary | ICD-10-CM | POA: Diagnosis not present

## 2020-11-17 DIAGNOSIS — G8929 Other chronic pain: Secondary | ICD-10-CM | POA: Diagnosis not present

## 2020-11-17 NOTE — Therapy (Signed)
[facility] [address], [place], [place], [address] Phone: [phone]   Fax:  [phone]  Physical Therapy Treatment  Patient Details  Name: Lisa Mcclure MRN: [id number] Date of Birth: 05/10/1932 Referring Provider (PT): Magnus Sinning, MD   Encounter Date: 11/17/2020   PT End of Session - 11/17/20 1634     Visit Number 2    Date for PT Re-Evaluation 12/29/20    Authorization Type Medicare    Authorization Time Period KX at 91 visits    Progress Note Due on Visit 10    PT Start Time 1403    PT Stop Time 1445    PT Time Calculation (min) 42 min    Activity Tolerance Patient tolerated treatment well    Behavior During Therapy [facility] for tasks assessed/performed             Past Medical History:  Diagnosis Date   Anemia    h/o hemorrhoidal bleeding and blood transfusion   Cardiomegaly    Chronic diastolic CHF (congestive heart failure) (HCC)    Depression    Diverticulosis    DOE (dyspnea on exertion)    Esophageal reflux    GERD (gastroesophageal reflux disease)    Hypothyroidism    Insomnia    LBP (low back pain)    OAB (overactive bladder)    Obesity    OSA (obstructive sleep apnea)    Osteoarthritis    Rheumatoid arthritis(714.0)    Shoulder pain, bilateral    Unspecified essential hypertension    Venous insufficiency     Past Surgical History:  Procedure Laterality Date   ABDOMINAL AORTIC ENDOVASCULAR STENT GRAFT N/A 12/20/2019   Procedure: Aortogram including catheter selection of aorta and bilateral iliac arteriogram, Endovascular repair of infrarenal abdominal aortic aneurysm with bifurcated stent graft (26 mm x 14 x 12 main body, right bell bottom with a 20 mm x 10 cm piece, and left bell bottom with a 16 mm x 12 cm piece) ;  Surgeon: Marty Heck, MD;  Location: [place];  Service: Vascular;  Laterality: N/A;   APPENDECTOMY  1953   bladder abduction-1996  1996   breast  biopsy Right 1980   CARDIOVERSION N/A 12/24/2019   Procedure: CARDIOVERSION;  Surgeon: Adrian Prows, MD;  Location: [facility] ENDOSCOPY;  Service: Cardiovascular;  Laterality: N/A;   [place] N/A 04/10/2015   Procedure: FLEXIBLE SIGMOIDOSCOPY;  Surgeon: Arta Silence, MD;  Location: [facility] ENDOSCOPY;  Service: Endoscopy;  Laterality: N/A;   knee arthroscopy Right 1996, 2010   KNEE ARTHROSCOPY W/ AUTOGENOUS CARTILAGE IMPLANTATION (ACI) PROCEDURE Left 1994, 1995   REFRACTIVE SURGERY  01/2020   SHOULDER SURGERY  1990   TEE WITHOUT CARDIOVERSION N/A 12/24/2019   Procedure: TRANSESOPHAGEAL ECHOCARDIOGRAM (TEE);  Surgeon: Adrian Prows, MD;  Location: [place];  Service: Cardiovascular;  Laterality: N/A;   [place] Bilateral 12/20/2019   Procedure: Ultrasound-guided access of bilateral common femoral arteries for delivery of endograft and percutaneous closure;  Surgeon: Marty Heck, MD;  Location: [place];  Service: Vascular;  Laterality: Bilateral;   VESICOVAGINAL FISTULA CLOSURE W/ [place]    There were no vitals filed for this visit.   Subjective Assessment - 11/17/20 1626     Subjective  Patient reports significantly increaesd pain since beginning HEP. Has been unable to sleep through the night. Has contacted PCP for further guidance due to pain.    Pertinent History HTN, history of proximal humeral fracture    Limitations Lifting;House hold activities    Patient Stated Goals to be able to reach better; to have less pain    Currently in Pain? No/denies   pain is "off the charts" with certain movements                              [facility] Adult PT Treatment/Exercise - 11/17/20 0001       Exercises   Exercises Shoulder      Shoulder Exercises: Seated   Flexion 20 reps    Flexion  Limitations using UE ranger    Other Seated Exercises scapular circles using UE ranger; x20 clockwise; patient unable to tolerate counter clockwise pattern      Modalities   Modalities Electrical Stimulation      Electrical Stimulation   Electrical Stimulation Location Rt shoulder    Electrical Stimulation Action IFC    Electrical Stimulation Parameters 80-120 mHz    Electrical Stimulation Goals Pain      Manual Therapy   Manual Therapy Soft tissue mobilization;Passive ROM;Manual Traction    Manual therapy comments skilled palpation and assessment of tissues before, during, and after dry needling    Soft tissue mobilization STM to Rt infraspinatus, supraspinatus, teres, rhomboids, upper trap, and levator scap    Passive ROM flexion, internal rotation, and external rotation within pain free range    Manual Traction long axis GH joint distraction              Trigger Point Dry Needling - 11/17/20 0001     Consent Given? Yes    Education Handout Provided Previously provided    Muscles Treated Head and Neck Upper trapezius    Muscles Treated Upper Quadrant Supraspinatus;Infraspinatus    Dry Needling Comments Rt only    Upper Trapezius Response Twitch reponse elicited;Palpable increased muscle length    Supraspinatus Response Palpable increased muscle length    Infraspinatus Response Twitch response elicited;Palpable increased muscle length                    PT Short Term Goals - 11/17/20 1633       PT SHORT TERM GOAL #1   Title Patient will be independent with HEP for continued progression at home.    Time 4    Period Weeks    Status On-going    Target Date 12/01/20               PT Long Term Goals - 11/03/20 1558       PT LONG TERM GOAL #1   Title Patient will be independent with advanced HEP for long term management of symptoms post D/C.    Time 8    Period Weeks    Status New    Target Date 12/29/20      PT LONG TERM GOAL #2   Title Patient  will demonstrate Rt shoulder IR to L3 to more readily performing dressing and hygiene tasks.    Baseline functionally to Rt greater trochanter    Time 8    Period Weeks    Status New    Target Date 12/29/20      PT LONG TERM GOAL #3   Title Patient will  score >/= 62 on FOTO to indicate improved overall function.    Baseline 55    Time 8    Period Weeks    Status New    Target Date 12/01/20      PT LONG TERM GOAL #4   Title Patient will demonstrate 120 degrees Rt shoulder flexion and abduction for improved reaching and lifting.    Time 8    Period Weeks    Status New    Target Date 12/29/20                   Plan - 11/17/20 1627     Clinical Impression Statement This session focused on pain management as patient unable to tolerate even light AAROM this date. Patient unable to tolerate PROM into scaption or abduction. Therapist noting significant turgor of upper trap and rotator cuff musculature during STM. Patient would benefit from continued skilled intervention to address impairments for improved functional use of dominant Rt UE.    Personal Factors and Comorbidities Age;Time since onset of injury/illness/exacerbation;Comorbidity 2    Comorbidities HTN, history of proximal humeral fracture    Examination-Activity Limitations Reach Overhead;Lift;Dressing    Examination-Participation Restrictions Meal Prep    Rehab Potential Fair    PT Frequency 2x / week    PT Duration 8 weeks    PT Treatment/Interventions ADLs/Self Care Home Management;Cryotherapy;Electrical Stimulation;Iontophoresis '4mg'$ /ml Dexamethasone;Moist Heat;Therapeutic activities;Therapeutic exercise;Neuromuscular re-education;Patient/family education;Manual techniques;Passive range of motion;Dry needling;Taping;Spinal Manipulations;Joint Manipulations    PT Next Visit Plan attempt progression of PROM, AAROM, and scapular strengtheing to patient tolerance; f/u on DN #1; continue manual as needed    PT Home  Exercise Plan Access Code: [email or web address]    Consulted and Agree with Plan of Care Patient             Patient will benefit from skilled therapeutic intervention in order to improve the following deficits and impairments:  Decreased activity tolerance, Decreased range of motion, Decreased strength, Increased muscle spasms, Impaired flexibility, Impaired UE functional use, Improper body mechanics, Postural dysfunction, Pain  Visit Diagnosis: Chronic right shoulder pain  Abnormal posture  Cramp and spasm  Muscle weakness (generalized)     Problem List Patient Active Problem List   Diagnosis Date Noted   Atrial fibrillation with RVR ([place])    Long term (current) use of anticoagulants    Long term current use of antiarrhythmic drug    AAA (abdominal aortic aneurysm) without rupture ([place]) 12/20/2019   AAA (abdominal aortic aneurysm) ([place]) 12/20/2019   Intolerance of continuous positive airway pressure (CPAP) ventilation 11/26/2019   Severe hypoxemia 08/21/2019   Severe obstructive sleep apnea 07/29/2019   Chronic obstructive pulmonary disease (HCC) 07/29/2019   RLS (restless legs syndrome) 07/29/2019   Dehydration with hyponatremia 09/13/2018   Bradycardia 09/13/2018   Glaucoma 09/13/2018   OA (osteoarthritis) of shoulder 05/16/2018   Incomplete emptying of bladder 05/17/2017   Labial adhesions 05/17/2017   Mixed stress and urge urinary incontinence 05/17/2017   Urethral sphincter deficiency, intrinsic (ISD) 05/17/2017   Aortic stenosis, mild 01/15/2016   Bilateral carotid bruits 05/11/2015   Hyponatremia 04/19/2015   Chronic anemia 04/19/2015   Panic attack 04/19/2015   Constipation 04/19/2015   Hyperkalemia 04/17/2015   Influenza A 04/16/2015   Proximal humerus fracture    Left patella fracture    Benign essential HTN    Esophageal reflux    Chronic heart failure with preserved ejection fraction (HFpEF) (HCC)    Hypothyroidism    Lower  GI bleed 04/08/2015    Hematochezia    Rectal bleeding 04/03/2015   Patellar fracture 03/06/2015   Left humeral fracture 11/04/2013   Rib fracture 11/04/2013   Hypothyroid 11/04/2013   Hypertension 11/04/2013   Right shoulder pain 11/04/2013   OSA (obstructive sleep apnea) 11/13/2008   BOOP (bronchiolitis obliterans with organizing pneumonia) ([place]) 08/25/2008   Everardo All PT, DPT  11/17/20 4:35 PM   [facility] [address], [place], [place], [address] Phone: [phone]   Fax:  [phone]  Name: ETANA MELCHERT MRN: [id number] Date of Birth: 08-08-1932

## 2020-11-23 ENCOUNTER — Other Ambulatory Visit: Payer: Self-pay

## 2020-11-23 ENCOUNTER — Encounter: Payer: Self-pay | Admitting: Physical Therapy

## 2020-11-23 ENCOUNTER — Ambulatory Visit: Payer: Medicare Other | Admitting: Physical Therapy

## 2020-11-23 DIAGNOSIS — G8929 Other chronic pain: Secondary | ICD-10-CM | POA: Diagnosis not present

## 2020-11-23 DIAGNOSIS — R252 Cramp and spasm: Secondary | ICD-10-CM | POA: Diagnosis not present

## 2020-11-23 DIAGNOSIS — R293 Abnormal posture: Secondary | ICD-10-CM | POA: Diagnosis not present

## 2020-11-23 DIAGNOSIS — M6281 Muscle weakness (generalized): Secondary | ICD-10-CM | POA: Diagnosis not present

## 2020-11-23 DIAGNOSIS — M25511 Pain in right shoulder: Secondary | ICD-10-CM | POA: Diagnosis not present

## 2020-11-23 NOTE — Therapy (Signed)
[facility] [address], [place], [place], [address] Phone: [phone]   Fax:  [phone]  Physical Therapy Treatment  Patient Details  Name: Lisa Mcclure MRN: [id number] Date of Birth: [birth year] Referring Provider (PT): Magnus Sinning, MD   Encounter Date: 11/23/2020   PT End of Session - 11/23/20 1241     Visit Number 3    Date for PT Re-Evaluation 12/29/20    Authorization Type Medicare    Authorization Time Period KX at 15 visits    Progress Note Due on Visit 10    PT Start Time 1240    PT Stop Time 1318    PT Time Calculation (min) 38 min    Activity Tolerance Patient limited by pain;Patient tolerated treatment well    Behavior During Therapy --             Past Medical History:  Diagnosis Date   Anemia    h/o hemorrhoidal bleeding and blood transfusion   Cardiomegaly    Chronic diastolic CHF (congestive heart failure) (HCC)    Depression    Diverticulosis    DOE (dyspnea on exertion)    Esophageal reflux    GERD (gastroesophageal reflux disease)    Hypothyroidism    Insomnia    LBP (low back pain)    OAB (overactive bladder)    Obesity    OSA (obstructive sleep apnea)    Osteoarthritis    Rheumatoid arthritis(714.0)    Shoulder pain, bilateral    Unspecified essential hypertension    Venous insufficiency     Past Surgical History:  Procedure Laterality Date   ABDOMINAL AORTIC ENDOVASCULAR STENT GRAFT N/A 12/20/2019   Procedure: Aortogram including catheter selection of aorta and bilateral iliac arteriogram, Endovascular repair of infrarenal abdominal aortic aneurysm with bifurcated stent graft (26 mm x 14 x 12 main body, right bell bottom with a 20 mm x 10 cm piece, and left bell bottom with a 16 mm x 12 cm piece) ;  Surgeon: Marty Heck, MD;  Location: [place];  Service: Vascular;  Laterality: N/A;   APPENDECTOMY  1953   bladder abduction-1996  1996   breast biopsy  Right 1980   CARDIOVERSION N/A 12/24/2019   Procedure: CARDIOVERSION;  Surgeon: Adrian Prows, MD;  Location: [facility] ENDOSCOPY;  Service: Cardiovascular;  Laterality: N/A;   [place] N/A 04/10/2015   Procedure: FLEXIBLE SIGMOIDOSCOPY;  Surgeon: Arta Silence, MD;  Location: [facility] ENDOSCOPY;  Service: Endoscopy;  Laterality: N/A;   knee arthroscopy Right 1996, 2010   KNEE ARTHROSCOPY W/ AUTOGENOUS CARTILAGE IMPLANTATION (ACI) PROCEDURE Left 1994, 1995   REFRACTIVE SURGERY  01/2020   SHOULDER SURGERY  1990   TEE WITHOUT CARDIOVERSION N/A 12/24/2019   Procedure: TRANSESOPHAGEAL ECHOCARDIOGRAM (TEE);  Surgeon: Adrian Prows, MD;  Location: [place];  Service: Cardiovascular;  Laterality: N/A;   [place] Bilateral 12/20/2019   Procedure: Ultrasound-guided access of bilateral common femoral arteries for delivery of endograft and percutaneous closure;  Surgeon: Marty Heck, MD;  Location: [place];  Service: Vascular;  Laterality: Bilateral;   VESICOVAGINAL FISTULA CLOSURE W/ [place]    There were no vitals filed for this visit.   Subjective Assessment - 11/23/20 1242     Subjective  The dry needlingwas helpful, joint is achey.    Pertinent History HTN, history of proximal humeral fracture    Currently in Pain? No/denies                               [facility] Adult PT Treatment/Exercise - 11/23/20 0001       Self-Care   Self-Care Other Self-Care Comments    Other Self-Care Comments  How to use home TENS unit, pt may be missing one connecting wire. Instructed pt how to turn machine on and how t ocharge.      Shoulder Exercises: Seated   Other Seated Exercises UE Ranger 10x in each direction      Shoulder Exercises: Standing   Other Standing Exercises finger ladder flexion 5x reaches #8       Shoulder Exercises: Stretch   Other Shoulder Stretches Flexion table stretch 10      Manual Therapy   Soft tissue mobilization Addaday assist to RT upper trap and medial scapla border.                      PT Short Term Goals - 11/17/20 1633       PT SHORT TERM GOAL #1   Title Patient will be independent with HEP for continued progression at home.    Time 4    Period Weeks    Status On-going    Target Date 12/01/20               PT Long Term Goals - 11/03/20 1558       PT LONG TERM GOAL #1   Title Patient will be independent with advanced HEP for long term management of symptoms post D/C.    Time 8    Period Weeks    Status New    Target Date 12/29/20      PT LONG TERM GOAL #2   Title Patient will demonstrate Rt shoulder IR to L3 to more readily performing dressing and hygiene tasks.    Baseline functionally to Rt greater trochanter    Time 8    Period Weeks    Status New    Target Date 12/29/20      PT LONG TERM GOAL #3   Title Patient will score >/= 62 on FOTO to indicate improved overall function.    Baseline 55    Time 8    Period Weeks    Status New    Target Date 12/01/20      PT LONG TERM GOAL #4   Title Patient will demonstrate 120 degrees Rt shoulder flexion and abduction for improved reaching and lifting.    Time 8    Period Weeks    Status New    Target Date 12/29/20                   Plan - 11/23/20 1258     Clinical Impression Statement Pt reports th edry needling helped her neck muscle a lot, but her jpint pain is about the same. Today the joint only felt "achey" and tolerated all AA/ROM exercises below 90 degrees. Pt brought her TENS unit given to her by her son-in-law but she doesn't know how to use it. We looked at it together but it seems there might be a cord missing. Pt does understand how to turn the machine on/off and how to charge. Pt was able to  go to #8 on the finger ladder today.    Personal Factors  and Comorbidities Age;Time since onset of injury/illness/exacerbation;Comorbidity 2    Comorbidities HTN, history of proximal humeral fracture    Examination-Activity Limitations Reach Overhead;Lift;Dressing    Examination-Participation Restrictions Meal Prep    Stability/Clinical Decision Making Stable/Uncomplicated    Rehab Potential Fair    PT Frequency 2x / week    PT Duration 8 weeks    PT Treatment/Interventions ADLs/Self Care Home Management;Cryotherapy;Electrical Stimulation;Iontophoresis '4mg'$ /ml Dexamethasone;Moist Heat;Therapeutic activities;Therapeutic exercise;Neuromuscular re-education;Patient/family education;Manual techniques;Passive range of motion;Dry needling;Taping;Spinal Manipulations;Joint Manipulations    PT Next Visit Plan attempt progression of PROM, AAROM, and scapular strengtheing to patient tolerance; f/u on DN #1; continue manual as needed    PT Home Exercise Plan Access Code: [email or web address]    Consulted and Agree with Plan of Care Patient             Patient will benefit from skilled therapeutic intervention in order to improve the following deficits and impairments:  Decreased activity tolerance, Decreased range of motion, Decreased strength, Increased muscle spasms, Impaired flexibility, Impaired UE functional use, Improper body mechanics, Postural dysfunction, Pain  Visit Diagnosis: Chronic right shoulder pain  Abnormal posture  Cramp and spasm  Muscle weakness (generalized)     Problem List Patient Active Problem List   Diagnosis Date Noted   Atrial fibrillation with RVR ([place])    Long term (current) use of anticoagulants    Long term current use of antiarrhythmic drug    AAA (abdominal aortic aneurysm) without rupture ([place]) 12/20/2019   AAA (abdominal aortic aneurysm) ([place]) 12/20/2019   Intolerance of continuous positive airway pressure (CPAP) ventilation 11/26/2019   Severe hypoxemia 08/21/2019   Severe obstructive sleep apnea 07/29/2019    Chronic obstructive pulmonary disease (HCC) 07/29/2019   RLS (restless legs syndrome) 07/29/2019   Dehydration with hyponatremia 09/13/2018   Bradycardia 09/13/2018   Glaucoma 09/13/2018   OA (osteoarthritis) of shoulder 05/16/2018   Incomplete emptying of bladder 05/17/2017   Labial adhesions 05/17/2017   Mixed stress and urge urinary incontinence 05/17/2017   Urethral sphincter deficiency, intrinsic (ISD) 05/17/2017   Aortic stenosis, mild 01/15/2016   Bilateral carotid bruits 05/11/2015   Hyponatremia 04/19/2015   Chronic anemia 04/19/2015   Panic attack 04/19/2015   Constipation 04/19/2015   Hyperkalemia 04/17/2015   Influenza A 04/16/2015   Proximal humerus fracture    Left patella fracture    Benign essential HTN    Esophageal reflux    Chronic heart failure with preserved ejection fraction (HFpEF) (HCC)    Hypothyroidism    Lower GI bleed 04/08/2015   Hematochezia    Rectal bleeding 04/03/2015   Patellar fracture 03/06/2015   Left humeral fracture 11/04/2013   Rib fracture 11/04/2013   Hypothyroid 11/04/2013   Hypertension 11/04/2013   Right shoulder pain 11/04/2013   OSA (obstructive sleep apnea) 11/13/2008   BOOP (bronchiolitis obliterans with organizing pneumonia) ([place]) 08/25/2008    Ekam Bonebrake, PTA 11/23/2020, 2:11 PM  [facility] [address] [place], [place], [address] Phone: [phone]   Fax:  [phone]  Name: Lisa Mcclure MRN: [id number] Date of Birth: [birth year]

## 2020-11-24 DIAGNOSIS — N3946 Mixed incontinence: Secondary | ICD-10-CM | POA: Diagnosis not present

## 2020-11-24 DIAGNOSIS — G4733 Obstructive sleep apnea (adult) (pediatric): Secondary | ICD-10-CM | POA: Diagnosis not present

## 2020-11-24 DIAGNOSIS — I10 Essential (primary) hypertension: Secondary | ICD-10-CM | POA: Diagnosis not present

## 2020-11-24 DIAGNOSIS — F3289 Other specified depressive episodes: Secondary | ICD-10-CM | POA: Diagnosis not present

## 2020-12-01 ENCOUNTER — Ambulatory Visit: Payer: Medicare Other | Admitting: Physical Therapy

## 2020-12-01 ENCOUNTER — Other Ambulatory Visit: Payer: Self-pay

## 2020-12-01 DIAGNOSIS — R293 Abnormal posture: Secondary | ICD-10-CM

## 2020-12-01 DIAGNOSIS — M6281 Muscle weakness (generalized): Secondary | ICD-10-CM

## 2020-12-01 DIAGNOSIS — M25511 Pain in right shoulder: Secondary | ICD-10-CM

## 2020-12-01 DIAGNOSIS — G8929 Other chronic pain: Secondary | ICD-10-CM | POA: Diagnosis not present

## 2020-12-01 DIAGNOSIS — R252 Cramp and spasm: Secondary | ICD-10-CM | POA: Diagnosis not present

## 2020-12-01 NOTE — Therapy (Signed)
[facility] [address] [place], [place], [address] Phone: [phone]   Fax:  [phone]  Physical Therapy Treatment  Patient Details  Name: Lisa Mcclure MRN: [id number] Date of Birth: Oct 27, 1932 Referring Provider (PT): Magnus Sinning, MD   Encounter Date: 12/01/2020   PT End of Session - 12/01/20 1632     Visit Number 4    Date for PT Re-Evaluation 12/29/20    Authorization Type Medicare    Authorization Time Period KX at 15 visits    Progress Note Due on Visit 10    PT Start Time 1451   patient arriving late   PT Stop Time 1530    PT Time Calculation (min) 39 min    Activity Tolerance Patient limited by pain;Patient tolerated treatment well    Behavior During Therapy WFL for tasks assessed/performed             Past Medical History:  Diagnosis Date   Anemia    h/o hemorrhoidal bleeding and blood transfusion   Cardiomegaly    Chronic diastolic CHF (congestive heart failure) (HCC)    Depression    Diverticulosis    DOE (dyspnea on exertion)    Esophageal reflux    GERD (gastroesophageal reflux disease)    Hypothyroidism    Insomnia    LBP (low back pain)    OAB (overactive bladder)    Obesity    OSA (obstructive sleep apnea)    Osteoarthritis    Rheumatoid arthritis(714.0)    Shoulder pain, bilateral    Unspecified essential hypertension    Venous insufficiency     Past Surgical History:  Procedure Laterality Date   ABDOMINAL AORTIC ENDOVASCULAR STENT GRAFT N/A 12/20/2019   Procedure: Aortogram including catheter selection of aorta and bilateral iliac arteriogram, Endovascular repair of infrarenal abdominal aortic aneurysm with bifurcated stent graft (26 mm x 14 x 12 main body, right bell bottom with a 20 mm x 10 cm piece, and left bell bottom with a 16 mm x 12 cm piece) ;  Surgeon: Marty Heck, MD;  Location: [place];  Service: Vascular;  Laterality: N/A;   APPENDECTOMY  1953    bladder abduction-1996  1996   breast biopsy Right 1980   CARDIOVERSION N/A 12/24/2019   Procedure: CARDIOVERSION;  Surgeon: Adrian Prows, MD;  Location: [facility] ENDOSCOPY;  Service: Cardiovascular;  Laterality: N/A;   [place] N/A 04/10/2015   Procedure: FLEXIBLE SIGMOIDOSCOPY;  Surgeon: Arta Silence, MD;  Location: [facility] ENDOSCOPY;  Service: Endoscopy;  Laterality: N/A;   knee arthroscopy Right 1996, 2010   KNEE ARTHROSCOPY W/ AUTOGENOUS CARTILAGE IMPLANTATION (ACI) PROCEDURE Left 1994, 1995   REFRACTIVE SURGERY  01/2020   SHOULDER SURGERY  1990   TEE WITHOUT CARDIOVERSION N/A 12/24/2019   Procedure: TRANSESOPHAGEAL ECHOCARDIOGRAM (TEE);  Surgeon: Adrian Prows, MD;  Location: [place];  Service: Cardiovascular;  Laterality: N/A;   [place] Bilateral 12/20/2019   Procedure: Ultrasound-guided access of bilateral common femoral arteries for delivery of endograft and percutaneous closure;  Surgeon: Marty Heck, MD;  Location: [place];  Service: Vascular;  Laterality: Bilateral;   VESICOVAGINAL FISTULA CLOSURE W/ [employer]    There were no vitals filed for this visit.   Subjective Assessment -  12/01/20 1627     Subjective Patient arriving late and requiring prolonged seated recovery prior to ambulating into treatment area. Felt that dry needling helped. Did not have much discomfort the day following dry needling and would like to continue with this modality.    Pertinent History HTN, history of proximal humeral fracture    Limitations Lifting;House hold activities    Patient Stated Goals to be able to reach better; to have less pain    Currently in Pain? No/denies                               [facility] Adult PT Treatment/Exercise - 12/01/20 0001       Shoulder Exercises: Seated    Row Both;5 reps    Theraband Level (Shoulder Row) Level 1 (Yellow)    Row Limitations unable to tolerate due to pain    Flexion 20 reps    Flexion Limitations using UE ranger    Other Seated Exercises scapular circles using UE ranger; x20 clockwise/counter clockwise    Other Seated Exercises UE ranger scaption x20 repetitions      Shoulder Exercises: Standing   Flexion AAROM;Both;15 reps    Flexion Limitations towel slides      Manual Therapy   Manual therapy comments skilled palpation and assessment of tissues before, during, and after dry needling              Trigger Point Dry Needling - 12/01/20 0001     Consent Given? Yes    Education Handout Provided Previously provided    Muscles Treated Head and Neck Upper trapezius;Levator scapulae    Muscles Treated Upper Quadrant Supraspinatus;Infraspinatus;Teres minor    Dry Needling Comments Rt only    Upper Trapezius Response Twitch reponse elicited;Palpable increased muscle length    Levator Scapulae Response Twitch response elicited;Palpable increased muscle length    Supraspinatus Response Palpable increased muscle length    Infraspinatus Response Palpable increased muscle length    Teres minor Response Palpable increased muscle length                    PT Short Term Goals - 12/01/20 1630       PT SHORT TERM GOAL #1   Title Patient will be independent with HEP for continued progression at home.    Time 4    Period Weeks    Status Achieved   unable to perform select exercises   Target Date 12/01/20      PT SHORT TERM GOAL #2   Title Patient will demonstrate at least 80 degrees Rt shoulder abduction to more readily perform ADLs.    Baseline 50 degrees    Time 4    Period Weeks    Status On-going    Target Date 12/01/20      PT SHORT TERM GOAL #3   Title Patient will demonstrate global Rt shoulder strength of 4-/5 to more readily perform lifting and carrying tasks.    Time 4    Period Weeks    Status  On-going    Target Date 12/01/20               PT Long Term Goals - 11/03/20 1558       PT LONG TERM GOAL #1   Title Patient will be independent with advanced HEP for long term management of symptoms post D/C.    Time 8    Period  Weeks    Status New    Target Date 12/29/20      PT LONG TERM GOAL #2   Title Patient will demonstrate Rt shoulder IR to L3 to more readily performing dressing and hygiene tasks.    Baseline functionally to Rt greater trochanter    Time 8    Period Weeks    Status New    Target Date 12/29/20      PT LONG TERM GOAL #3   Title Patient will score >/= 62 on FOTO to indicate improved overall function.    Baseline 55    Time 8    Period Weeks    Status New    Target Date 12/01/20      PT LONG TERM GOAL #4   Title Patient will demonstrate 120 degrees Rt shoulder flexion and abduction for improved reaching and lifting.    Time 8    Period Weeks    Status New    Target Date 12/29/20                   Plan - 12/01/20 1628     Clinical Impression Statement Twitch response elicited with dry needling to Rt upper trap and levator scap. Patient reports improved tolerance of AAROM this date but continues to report increased pain with scapular exercises. She would benefit from continued skilled intervention along current POC.    Personal Factors and Comorbidities Age;Time since onset of injury/illness/exacerbation;Comorbidity 2    Comorbidities HTN, history of proximal humeral fracture    Examination-Activity Limitations Reach Overhead;Lift;Dressing    Examination-Participation Restrictions Meal Prep    Rehab Potential Fair    PT Duration 8 weeks    PT Treatment/Interventions ADLs/Self Care Home Management;Cryotherapy;Electrical Stimulation;Iontophoresis '4mg'$ /ml Dexamethasone;Moist Heat;Therapeutic activities;Therapeutic exercise;Neuromuscular re-education;Patient/family education;Manual techniques;Passive range of motion;Dry  needling;Taping;Spinal Manipulations;Joint Manipulations    PT Next Visit Plan attempt progression of PROM, AAROM, and scapular strengtheing to patient tolerance; f/u on DN #2; continue manual as needed    PT Home Exercise Plan Access Code: [email or web address]    Consulted and Agree with Plan of Care Patient             Patient will benefit from skilled therapeutic intervention in order to improve the following deficits and impairments:  Decreased activity tolerance, Decreased range of motion, Decreased strength, Increased muscle spasms, Impaired flexibility, Impaired UE functional use, Improper body mechanics, Postural dysfunction, Pain  Visit Diagnosis: Chronic right shoulder pain  Abnormal posture  Muscle weakness (generalized)     Problem List Patient Active Problem List   Diagnosis Date Noted   Atrial fibrillation with RVR ([place])    Long term (current) use of anticoagulants    Long term current use of antiarrhythmic drug    AAA (abdominal aortic aneurysm) without rupture ([place]) 12/20/2019   AAA (abdominal aortic aneurysm) ([place]) 12/20/2019   Intolerance of continuous positive airway pressure (CPAP) ventilation 11/26/2019   Severe hypoxemia 08/21/2019   Severe obstructive sleep apnea 07/29/2019   Chronic obstructive pulmonary disease (HCC) 07/29/2019   RLS (restless legs syndrome) 07/29/2019   Dehydration with hyponatremia 09/13/2018   Bradycardia 09/13/2018   Glaucoma 09/13/2018   OA (osteoarthritis) of shoulder 05/16/2018   Incomplete emptying of bladder 05/17/2017   Labial adhesions 05/17/2017   Mixed stress and urge urinary incontinence 05/17/2017   Urethral sphincter deficiency, intrinsic (ISD) 05/17/2017   Aortic stenosis, mild 01/15/2016   Bilateral carotid bruits 05/11/2015   Hyponatremia 04/19/2015   Chronic anemia 04/19/2015  Panic attack 04/19/2015   Constipation 04/19/2015   Hyperkalemia 04/17/2015   Influenza A 04/16/2015   Proximal humerus fracture    Left  patella fracture    Benign essential HTN    Esophageal reflux    Chronic heart failure with preserved ejection fraction (HFpEF) ([place])    Hypothyroidism    Lower GI bleed 04/08/2015   Hematochezia    Rectal bleeding 04/03/2015   Patellar fracture 03/06/2015   Left humeral fracture 11/04/2013   Rib fracture 11/04/2013   Hypothyroid 11/04/2013   Hypertension 11/04/2013   Right shoulder pain 11/04/2013   OSA (obstructive sleep apnea) 11/13/2008   BOOP (bronchiolitis obliterans with organizing pneumonia) ([place]) 08/25/2008   Everardo All PT, DPT 12/01/20 4:35 PM   [facility] [address], [place], [place], [address] Phone: [phone]   Fax:  [phone]  Name: Lisa Mcclure MRN: [id number] Date of Birth: 1932/12/17

## 2020-12-03 ENCOUNTER — Other Ambulatory Visit: Payer: Self-pay | Admitting: Student

## 2020-12-03 DIAGNOSIS — I480 Paroxysmal atrial fibrillation: Secondary | ICD-10-CM

## 2020-12-07 ENCOUNTER — Ambulatory Visit: Payer: Medicare Other | Admitting: Physical Therapy

## 2020-12-07 ENCOUNTER — Other Ambulatory Visit: Payer: Self-pay

## 2020-12-07 DIAGNOSIS — R252 Cramp and spasm: Secondary | ICD-10-CM | POA: Diagnosis not present

## 2020-12-07 DIAGNOSIS — M6281 Muscle weakness (generalized): Secondary | ICD-10-CM

## 2020-12-07 DIAGNOSIS — R293 Abnormal posture: Secondary | ICD-10-CM

## 2020-12-07 DIAGNOSIS — G8929 Other chronic pain: Secondary | ICD-10-CM | POA: Diagnosis not present

## 2020-12-07 DIAGNOSIS — M25511 Pain in right shoulder: Secondary | ICD-10-CM | POA: Diagnosis not present

## 2020-12-07 NOTE — Therapy (Signed)
[facility] [address], [place], [place], [address] Phone: [phone]   Fax:  [phone]  Physical Therapy Treatment  Patient Details  Name: Lisa Mcclure MRN: [id number] Date of Birth: [birth year] Referring Provider (PT): Magnus Sinning, MD   Encounter Date: 12/07/2020   PT End of Session - 12/07/20 1623     Visit Number 5    Date for PT Re-Evaluation 12/29/20    Authorization Type Medicare    Authorization Time Period KX at 25 visits    Progress Note Due on Visit 10    PT Start Time 1530    PT Stop Time 1604    PT Time Calculation (min) 34 min    Activity Tolerance Patient limited by pain    Behavior During Therapy [facility] for tasks assessed/performed             Past Medical History:  Diagnosis Date   Anemia    h/o hemorrhoidal bleeding and blood transfusion   Cardiomegaly    Chronic diastolic CHF (congestive heart failure) (HCC)    Depression    Diverticulosis    DOE (dyspnea on exertion)    Esophageal reflux    GERD (gastroesophageal reflux disease)    Hypothyroidism    Insomnia    LBP (low back pain)    OAB (overactive bladder)    Obesity    OSA (obstructive sleep apnea)    Osteoarthritis    Rheumatoid arthritis(714.0)    Shoulder pain, bilateral    Unspecified essential hypertension    Venous insufficiency     Past Surgical History:  Procedure Laterality Date   ABDOMINAL AORTIC ENDOVASCULAR STENT GRAFT N/A 12/20/2019   Procedure: Aortogram including catheter selection of aorta and bilateral iliac arteriogram, Endovascular repair of infrarenal abdominal aortic aneurysm with bifurcated stent graft (26 mm x 14 x 12 main body, right bell bottom with a 20 mm x 10 cm piece, and left bell bottom with a 16 mm x 12 cm piece) ;  Surgeon: Marty Heck, MD;  Location: [place];  Service: Vascular;  Laterality: N/A;   APPENDECTOMY  1953   bladder abduction-1996  1996   breast biopsy Right  1980   CARDIOVERSION N/A 12/24/2019   Procedure: CARDIOVERSION;  Surgeon: Adrian Prows, MD;  Location: [facility] ENDOSCOPY;  Service: Cardiovascular;  Laterality: N/A;   [place] N/A 04/10/2015   Procedure: FLEXIBLE SIGMOIDOSCOPY;  Surgeon: Arta Silence, MD;  Location: [facility] ENDOSCOPY;  Service: Endoscopy;  Laterality: N/A;   knee arthroscopy Right 1996, 2010   KNEE ARTHROSCOPY W/ AUTOGENOUS CARTILAGE IMPLANTATION (ACI) PROCEDURE Left 1994, 1995   REFRACTIVE SURGERY  01/2020   SHOULDER SURGERY  1990   TEE WITHOUT CARDIOVERSION N/A 12/24/2019   Procedure: TRANSESOPHAGEAL ECHOCARDIOGRAM (TEE);  Surgeon: Adrian Prows, MD;  Location: [place];  Service: Cardiovascular;  Laterality: N/A;   [place] Bilateral 12/20/2019   Procedure: Ultrasound-guided access of bilateral common femoral arteries for delivery of endograft and percutaneous closure;  Surgeon: Marty Heck, MD;  Location: [place];  Service: Vascular;  Laterality: Bilateral;   VESICOVAGINAL FISTULA CLOSURE W/ [place]    There were no vitals filed for this visit.   Subjective Assessment - 12/07/20 1617     Subjective  Had significant pain Wednesday. Feels that it is starting to calm down but unsure how much she can tolerate this date.    Pertinent History HTN, history of proximal humeral fracture    Limitations Lifting;House hold activities    Patient Stated Goals to be able to reach better; to have less pain    Currently in Pain? Yes    Pain Score 6     Pain Location Shoulder    Pain Orientation Right    Pain Descriptors / Indicators Aching;Discomfort    Pain Type Chronic pain                               [facility] Adult PT Treatment/Exercise - 12/07/20 0001       Elbow Exercises   Elbow Flexion Right;15 reps;Bar  weights/barbell   1 lb     Shoulder Exercises: Seated   Flexion 20 reps    Flexion Limitations using UE ranger    Other Seated Exercises scapular circles using UE ranger; x20 clockwise/counter clockwise    Other Seated Exercises UE ranger scaption x20 repetitions      Modalities   Modalities Moist Heat      Moist Heat Therapy   Number Minutes Moist Heat 5 Minutes    Moist Heat Location Shoulder      Manual Therapy   Manual therapy comments skilled palpation and assessment of tissues before, during, and after dry needling              Trigger Point Dry Needling - 12/07/20 0001     Consent Given? Yes    Education Handout Provided Previously provided    Muscles Treated Head and Neck Upper trapezius;Levator scapulae    Muscles Treated Upper Quadrant Supraspinatus;Infraspinatus    Dry Needling Comments Rt only    Upper Trapezius Response Twitch reponse elicited;Palpable increased muscle length    Levator Scapulae Response Twitch response elicited;Palpable increased muscle length    Supraspinatus Response Palpable increased muscle length    Infraspinatus Response Palpable increased muscle length                    PT Short Term Goals - 12/07/20 1622       PT SHORT TERM GOAL #1   Title Patient will be independent with HEP for continued progression at home.    Time 4    Period Weeks    Status Achieved   performs what she can tolerate   Target Date 12/01/20      PT SHORT TERM GOAL #2   Title Patient will demonstrate at least 80 degrees Rt shoulder abduction to more readily perform ADLs.    Baseline 50 degrees    Time 4    Period Weeks    Status On-going    Target Date 12/01/20      PT SHORT TERM GOAL #3   Title Patient will demonstrate global Rt shoulder strength of 4-/5 to more readily perform lifting and carrying tasks.    Time 4    Period Weeks    Status On-going    Target Date 12/01/20               PT Long Term Goals - 11/03/20 1558        PT LONG TERM GOAL #1   Title Patient will be independent with advanced HEP for long term management of symptoms post D/C.    Time 8  Period Weeks    Status New    Target Date 12/29/20      PT LONG TERM GOAL #2   Title Patient will demonstrate Rt shoulder IR to L3 to more readily performing dressing and hygiene tasks.    Baseline functionally to Rt greater trochanter    Time 8    Period Weeks    Status New    Target Date 12/29/20      PT LONG TERM GOAL #3   Title Patient will score >/= 62 on FOTO to indicate improved overall function.    Baseline 55    Time 8    Period Weeks    Status New    Target Date 12/01/20      PT LONG TERM GOAL #4   Title Patient will demonstrate 120 degrees Rt shoulder flexion and abduction for improved reaching and lifting.    Time 8    Period Weeks    Status New    Target Date 12/29/20                   Plan - 12/07/20 1619     Clinical Impression Statement Patient with decreased activity tolerance this date thus this session focused on pain management. Patient continues to be tender to palpation so STM avoided. Good twitch response of upper trap and levator scap with dry needling. Consider is maximal potential has been reached next session with regards to therapy.    Personal Factors and Comorbidities Age;Time since onset of injury/illness/exacerbation;Comorbidity 2    Comorbidities HTN, history of proximal humeral fracture    Examination-Activity Limitations Reach Overhead;Lift;Dressing    Examination-Participation Restrictions Meal Prep    Rehab Potential Fair    PT Frequency 2x / week    PT Duration 8 weeks    PT Treatment/Interventions ADLs/Self Care Home Management;Cryotherapy;Electrical Stimulation;Iontophoresis '4mg'$ /ml Dexamethasone;Moist Heat;Therapeutic activities;Therapeutic exercise;Neuromuscular re-education;Patient/family education;Manual techniques;Passive range of motion;Dry needling;Taping;Spinal Manipulations;Joint  Manipulations    PT Next Visit Plan attempt progression of PROM, AAROM, and scapular strengtheing to patient tolerance; f/u on DN ##; manual as tolerated    PT Home Exercise Plan Access Code: [email or web address]    Consulted and Agree with Plan of Care Patient             Patient will benefit from skilled therapeutic intervention in order to improve the following deficits and impairments:  Decreased activity tolerance, Decreased range of motion, Decreased strength, Increased muscle spasms, Impaired flexibility, Impaired UE functional use, Improper body mechanics, Postural dysfunction, Pain  Visit Diagnosis: Chronic right shoulder pain  Abnormal posture  Muscle weakness (generalized)  Cramp and spasm     Problem List Patient Active Problem List   Diagnosis Date Noted   Atrial fibrillation with RVR ([place])    Long term (current) use of anticoagulants    Long term current use of antiarrhythmic drug    AAA (abdominal aortic aneurysm) without rupture ([place]) 12/20/2019   AAA (abdominal aortic aneurysm) ([place]) 12/20/2019   Intolerance of continuous positive airway pressure (CPAP) ventilation 11/26/2019   Severe hypoxemia 08/21/2019   Severe obstructive sleep apnea 07/29/2019   Chronic obstructive pulmonary disease (HCC) 07/29/2019   RLS (restless legs syndrome) 07/29/2019   Dehydration with hyponatremia 09/13/2018   Bradycardia 09/13/2018   Glaucoma 09/13/2018   OA (osteoarthritis) of shoulder 05/16/2018   Incomplete emptying of bladder 05/17/2017   Labial adhesions 05/17/2017   Mixed stress and urge urinary incontinence 05/17/2017   Urethral sphincter deficiency, intrinsic (ISD) 05/17/2017  Aortic stenosis, mild 01/15/2016   Bilateral carotid bruits 05/11/2015   Hyponatremia 04/19/2015   Chronic anemia 04/19/2015   Panic attack 04/19/2015   Constipation 04/19/2015   Hyperkalemia 04/17/2015   Influenza A 04/16/2015   Proximal humerus fracture    Left patella fracture    Benign  essential HTN    Esophageal reflux    Chronic heart failure with preserved ejection fraction (HFpEF) ([place])    Hypothyroidism    Lower GI bleed 04/08/2015   Hematochezia    Rectal bleeding 04/03/2015   Patellar fracture 03/06/2015   Left humeral fracture 11/04/2013   Rib fracture 11/04/2013   Hypothyroid 11/04/2013   Hypertension 11/04/2013   Right shoulder pain 11/04/2013   OSA (obstructive sleep apnea) 11/13/2008   BOOP (bronchiolitis obliterans with organizing pneumonia) ([place]) 08/25/2008   Everardo All PT, DPT 12/07/20 4:26 PM   [facility] [address], [place], [place], [address] Phone: [phone]   Fax:  [phone]  Name: HEDDA MITRI MRN: [id number] Date of Birth: [birth year]

## 2020-12-15 ENCOUNTER — Other Ambulatory Visit: Payer: Self-pay

## 2020-12-15 ENCOUNTER — Ambulatory Visit: Payer: Medicare Other | Attending: Physical Medicine and Rehabilitation

## 2020-12-15 DIAGNOSIS — R252 Cramp and spasm: Secondary | ICD-10-CM | POA: Diagnosis not present

## 2020-12-15 DIAGNOSIS — R293 Abnormal posture: Secondary | ICD-10-CM | POA: Diagnosis not present

## 2020-12-15 DIAGNOSIS — M6281 Muscle weakness (generalized): Secondary | ICD-10-CM | POA: Diagnosis not present

## 2020-12-15 DIAGNOSIS — G8929 Other chronic pain: Secondary | ICD-10-CM | POA: Diagnosis not present

## 2020-12-15 DIAGNOSIS — M25511 Pain in right shoulder: Secondary | ICD-10-CM | POA: Insufficient documentation

## 2020-12-15 NOTE — Therapy (Signed)
[facility] [address], [place], [place], [address] Phone: [phone]   Fax:  [phone]  Physical Therapy Treatment  Patient Details  Name: Lisa Mcclure MRN: [id number] Date of Birth: [birth year] Referring Provider (PT): Magnus Sinning, MD   Encounter Date: 12/15/2020   PT End of Session - 12/15/20 1314     Visit Number 6    Date for PT Re-Evaluation 12/29/20    Authorization Type Medicare    Authorization Time Period KX at 74 visits    Progress Note Due on Visit 10    PT Start Time 1231    PT Stop Time 1313    PT Time Calculation (min) 42 min    Activity Tolerance Patient limited by pain    Behavior During Therapy [facility] for tasks assessed/performed             Past Medical History:  Diagnosis Date   Anemia    h/o hemorrhoidal bleeding and blood transfusion   Cardiomegaly    Chronic diastolic CHF (congestive heart failure) (HCC)    Depression    Diverticulosis    DOE (dyspnea on exertion)    Esophageal reflux    GERD (gastroesophageal reflux disease)    Hypothyroidism    Insomnia    LBP (low back pain)    OAB (overactive bladder)    Obesity    OSA (obstructive sleep apnea)    Osteoarthritis    Rheumatoid arthritis(714.0)    Shoulder pain, bilateral    Unspecified essential hypertension    Venous insufficiency     Past Surgical History:  Procedure Laterality Date   ABDOMINAL AORTIC ENDOVASCULAR STENT GRAFT N/A 12/20/2019   Procedure: Aortogram including catheter selection of aorta and bilateral iliac arteriogram, Endovascular repair of infrarenal abdominal aortic aneurysm with bifurcated stent graft (26 mm x 14 x 12 main body, right bell bottom with a 20 mm x 10 cm piece, and left bell bottom with a 16 mm x 12 cm piece) ;  Surgeon: Marty Heck, MD;  Location: [place];  Service: Vascular;  Laterality: N/A;   APPENDECTOMY  1953   bladder abduction-1996  1996   breast biopsy Right  1980   CARDIOVERSION N/A 12/24/2019   Procedure: CARDIOVERSION;  Surgeon: Adrian Prows, MD;  Location: [facility] ENDOSCOPY;  Service: Cardiovascular;  Laterality: N/A;   [place] N/A 04/10/2015   Procedure: FLEXIBLE SIGMOIDOSCOPY;  Surgeon: Arta Silence, MD;  Location: [facility] ENDOSCOPY;  Service: Endoscopy;  Laterality: N/A;   knee arthroscopy Right 1996, 2010   KNEE ARTHROSCOPY W/ AUTOGENOUS CARTILAGE IMPLANTATION (ACI) PROCEDURE Left 1994, 1995   REFRACTIVE SURGERY  01/2020   SHOULDER SURGERY  1990   TEE WITHOUT CARDIOVERSION N/A 12/24/2019   Procedure: TRANSESOPHAGEAL ECHOCARDIOGRAM (TEE);  Surgeon: Adrian Prows, MD;  Location: [place];  Service: Cardiovascular;  Laterality: N/A;   [place] Bilateral 12/20/2019   Procedure: Ultrasound-guided access of bilateral common femoral arteries for delivery of endograft and percutaneous closure;  Surgeon: Marty Heck, MD;  Location: [place];  Service: Vascular;  Laterality: Bilateral;   VESICOVAGINAL FISTULA CLOSURE W/ [place]    There were no vitals filed for this visit.   Subjective Assessment - 12/15/20 1233     Subjective  I've had a puny weekend.  My bloodpressure was very low.    Currently in Pain? Yes    Pain Score 5     Pain Location Shoulder    Pain Orientation Right    Pain Descriptors / Indicators Aching;Discomfort    Pain Onset More than a month ago    Pain Frequency Constant    Aggravating Factors  pushing the walker, using Rt UE for mouse, sleep    Pain Relieving Factors ice/heat, rest                               [facility] Adult PT Treatment/Exercise - 12/15/20 0001       Elbow Exercises   Elbow Flexion Right;15 reps;Bar weights/barbell   1 lb     Shoulder Exercises: Seated   Flexion 20 reps    Flexion Limitations  using UE ranger, seated flexion 2x5 without ranger    Other Seated Exercises scapular circles using UE ranger; x20 clockwise/counter clockwise    Other Seated Exercises UE ranger scaption x20 repetitions      Manual Therapy   Manual therapy comments skilled palpation and assessment of tissues before, during, and after dry needling    Soft tissue mobilization elongation to Rt upper trap, posterior shoulder and scapular muscles              Trigger Point Dry Needling - 12/15/20 0001     Consent Given? Yes    Education Handout Provided Previously provided    Muscles Treated Head and Neck Upper trapezius;Levator scapulae    Muscles Treated Upper Quadrant Supraspinatus;Infraspinatus    Dry Needling Comments Rt only    Upper Trapezius Response Twitch reponse elicited;Palpable increased muscle length    Levator Scapulae Response Twitch response elicited;Palpable increased muscle length    Supraspinatus Response Palpable increased muscle length    Infraspinatus Response Palpable increased muscle length                    PT Short Term Goals - 12/07/20 1622       PT SHORT TERM GOAL #1   Title Patient will be independent with HEP for continued progression at home.    Time 4    Period Weeks    Status Achieved   performs what she can tolerate   Target Date 12/01/20      PT SHORT TERM GOAL #2   Title Patient will demonstrate at least 80 degrees Rt shoulder abduction to more readily perform ADLs.    Baseline 50 degrees    Time 4    Period Weeks    Status On-going    Target Date 12/01/20      PT SHORT TERM GOAL #3   Title Patient will demonstrate global Rt shoulder strength of 4-/5 to more readily perform lifting and carrying tasks.    Time 4    Period Weeks    Status On-going    Target Date 12/01/20               PT Long Term Goals - 11/03/20 1558       PT LONG TERM GOAL #1   Title Patient will be independent with advanced HEP for long term management of  symptoms post D/C.    Time 8    Period Weeks    Status New    Target Date 12/29/20      PT LONG TERM GOAL #  2   Title Patient will demonstrate Rt shoulder IR to L3 to more readily performing dressing and hygiene tasks.    Baseline functionally to Rt greater trochanter    Time 8    Period Weeks    Status New    Target Date 12/29/20      PT LONG TERM GOAL #3   Title Patient will score >/= 62 on FOTO to indicate improved overall function.    Baseline 55    Time 8    Period Weeks    Status New    Target Date 12/01/20      PT LONG TERM GOAL #4   Title Patient will demonstrate 120 degrees Rt shoulder flexion and abduction for improved reaching and lifting.    Time 8    Period Weeks    Status New    Target Date 12/29/20                   Plan - 12/15/20 1312     Clinical Impression Statement Pt with limited tolerance for exercise due to pain. Pt reports minimal improvement in symptoms but does report that her muscles feel much better with DN to the Rt shoulder.  Pt fatigues and becomes painful with more reps of exercise. Pt requires tactile cues for scapular alignment  and to reduce activation of upper trap with movement against gravity.  Good twitch response of upper trap and levator scap with dry needling and improved tissue mobility after manual therapy today. Pt will benefit from skilled PT to address chronic Rt shoulder pain.    PT Treatment/Interventions ADLs/Self Care Home Management;Cryotherapy;Electrical Stimulation;Iontophoresis '4mg'$ /ml Dexamethasone;Moist Heat;Therapeutic activities;Therapeutic exercise;Neuromuscular re-education;Patient/family education;Manual techniques;Passive range of motion;Dry needling;Taping;Spinal Manipulations;Joint Manipulations    PT Next Visit Plan 2 more sessions.  Gentle mobility and DN/manual therapy    PT Home Exercise Plan Access Code: [email or web address]    Recommended Other Services initial cert is signed    Consulted and Agree with Plan of  Care Patient             Patient will benefit from skilled therapeutic intervention in order to improve the following deficits and impairments:  Decreased activity tolerance, Decreased range of motion, Decreased strength, Increased muscle spasms, Impaired flexibility, Impaired UE functional use, Improper body mechanics, Postural dysfunction, Pain  Visit Diagnosis: Chronic right shoulder pain  Abnormal posture  Muscle weakness (generalized)  Cramp and spasm     Problem List Patient Active Problem List   Diagnosis Date Noted   Atrial fibrillation with RVR ([place])    Long term (current) use of anticoagulants    Long term current use of antiarrhythmic drug    AAA (abdominal aortic aneurysm) without rupture ([place]) 12/20/2019   AAA (abdominal aortic aneurysm) ([place]) 12/20/2019   Intolerance of continuous positive airway pressure (CPAP) ventilation 11/26/2019   Severe hypoxemia 08/21/2019   Severe obstructive sleep apnea 07/29/2019   Chronic obstructive pulmonary disease ([place]) 07/29/2019   RLS (restless legs syndrome) 07/29/2019   Dehydration with hyponatremia 09/13/2018   Bradycardia 09/13/2018   Glaucoma 09/13/2018   OA (osteoarthritis) of shoulder 05/16/2018   Incomplete emptying of bladder 05/17/2017   Labial adhesions 05/17/2017   Mixed stress and urge urinary incontinence 05/17/2017   Urethral sphincter deficiency, intrinsic (ISD) 05/17/2017   Aortic stenosis, mild 01/15/2016   Bilateral carotid bruits 05/11/2015   Hyponatremia 04/19/2015   Chronic anemia 04/19/2015   Panic attack 04/19/2015   Constipation 04/19/2015   Hyperkalemia 04/17/2015   Influenza  A 04/16/2015   Proximal humerus fracture    Left patella fracture    Benign essential HTN    Esophageal reflux    Chronic heart failure with preserved ejection fraction (HFpEF) ([place])    Hypothyroidism    Lower GI bleed 04/08/2015   Hematochezia    Rectal bleeding 04/03/2015   Patellar fracture 03/06/2015   Left  humeral fracture 11/04/2013   Rib fracture 11/04/2013   Hypothyroid 11/04/2013   Hypertension 11/04/2013   Right shoulder pain 11/04/2013   OSA (obstructive sleep apnea) 11/13/2008   BOOP (bronchiolitis obliterans with organizing pneumonia) ([place]) 08/25/2008   Sigurd Sos, PT 12/15/20 1:16 PM   [facility] [address], [place], [place], [address] Phone: [phone]   Fax:  [phone]  Name: Lisa Mcclure MRN: [id number] Date of Birth: 03/18/1933

## 2020-12-22 ENCOUNTER — Other Ambulatory Visit: Payer: Self-pay

## 2020-12-22 ENCOUNTER — Ambulatory Visit: Payer: Medicare Other

## 2020-12-22 DIAGNOSIS — R252 Cramp and spasm: Secondary | ICD-10-CM | POA: Diagnosis not present

## 2020-12-22 DIAGNOSIS — M6281 Muscle weakness (generalized): Secondary | ICD-10-CM | POA: Diagnosis not present

## 2020-12-22 DIAGNOSIS — G8929 Other chronic pain: Secondary | ICD-10-CM | POA: Diagnosis not present

## 2020-12-22 DIAGNOSIS — R293 Abnormal posture: Secondary | ICD-10-CM | POA: Diagnosis not present

## 2020-12-22 DIAGNOSIS — M25511 Pain in right shoulder: Secondary | ICD-10-CM | POA: Diagnosis not present

## 2020-12-22 NOTE — Therapy (Signed)
[facility] [address], [place], [place], [address] Phone: [phone]   Fax:  [phone]  Physical Therapy Treatment  Patient Details  Name: Lisa Mcclure MRN: [id number] Date of Birth: [birth year] Referring Provider (PT): Magnus Sinning, MD   Encounter Date: 12/22/2020   PT End of Session - 12/22/20 1302     Visit Number 7    Date for PT Re-Evaluation 12/29/20    Authorization Type Medicare    Authorization Time Period KX at 86 visits    Progress Note Due on Visit 10    PT Start Time 1219    PT Stop Time 1300    PT Time Calculation (min) 41 min    Activity Tolerance Patient limited by pain    Behavior During Therapy [employer] for tasks assessed/performed             Past Medical History:  Diagnosis Date   Anemia    h/o hemorrhoidal bleeding and blood transfusion   Cardiomegaly    Chronic diastolic CHF (congestive heart failure) (HCC)    Depression    Diverticulosis    DOE (dyspnea on exertion)    Esophageal reflux    GERD (gastroesophageal reflux disease)    Hypothyroidism    Insomnia    LBP (low back pain)    OAB (overactive bladder)    Obesity    OSA (obstructive sleep apnea)    Osteoarthritis    Rheumatoid arthritis(714.0)    Shoulder pain, bilateral    Unspecified essential hypertension    Venous insufficiency     Past Surgical History:  Procedure Laterality Date   ABDOMINAL AORTIC ENDOVASCULAR STENT GRAFT N/A 12/20/2019   Procedure: Aortogram including catheter selection of aorta and bilateral iliac arteriogram, Endovascular repair of infrarenal abdominal aortic aneurysm with bifurcated stent graft (26 mm x 14 x 12 main body, right bell bottom with a 20 mm x 10 cm piece, and left bell bottom with a 16 mm x 12 cm piece) ;  Surgeon: Marty Heck, MD;  Location: [place];  Service: Vascular;  Laterality: N/A;   APPENDECTOMY  1953   bladder abduction-1996  1996   breast biopsy Right  1980   CARDIOVERSION N/A 12/24/2019   Procedure: CARDIOVERSION;  Surgeon: Adrian Prows, MD;  Location: [facility] ENDOSCOPY;  Service: Cardiovascular;  Laterality: N/A;   [place] N/A 04/10/2015   Procedure: FLEXIBLE SIGMOIDOSCOPY;  Surgeon: Arta Silence, MD;  Location: [facility] ENDOSCOPY;  Service: Endoscopy;  Laterality: N/A;   knee arthroscopy Right 1996, 2010   KNEE ARTHROSCOPY W/ AUTOGENOUS CARTILAGE IMPLANTATION (ACI) PROCEDURE Left 1994, 1995   REFRACTIVE SURGERY  01/2020   SHOULDER SURGERY  1990   TEE WITHOUT CARDIOVERSION N/A 12/24/2019   Procedure: TRANSESOPHAGEAL ECHOCARDIOGRAM (TEE);  Surgeon: Adrian Prows, MD;  Location: [place];  Service: Cardiovascular;  Laterality: N/A;   [place] Bilateral 12/20/2019   Procedure: Ultrasound-guided access of bilateral common femoral arteries for delivery of endograft and percutaneous closure;  Surgeon: Marty Heck, MD;  Location: [place];  Service: Vascular;  Laterality: Bilateral;   VESICOVAGINAL FISTULA CLOSURE W/ [place]    There were no vitals filed for this visit.   Subjective Assessment - 12/22/20 1221     Subjective  My shoulder is really bothering me.  I feel better for a few days after the needling but it doesn't last.  I am not sleeping well.    Pertinent History HTN, history of proximal humeral fracture    Currently in Pain? Yes    Pain Location Shoulder    Pain Orientation Right    Pain Descriptors / Indicators Aching    Pain Type Chronic pain    Pain Onset More than a month ago    Pain Frequency Constant    Aggravating Factors  pushing walker, using Rt UE for mouse, reaching, sleep    Pain Relieving Factors ice/heat, rest                               [facility] Adult PT Treatment/Exercise - 12/22/20 0001       Shoulder  Exercises: Seated   Flexion 20 reps    Flexion Limitations using UE ranger, seated flexion 2x5 without ranger    Other Seated Exercises scapular circles using UE ranger; x20 clockwise/counter clockwise    Other Seated Exercises UE ranger scaption x20 repetitions      Manual Therapy   Manual therapy comments skilled palpation and assessment of tissues before, during, and after dry needling    Soft tissue mobilization elongation to Rt upper trap, posterior shoulder and scapular muscles              Trigger Point Dry Needling - 12/22/20 0001     Consent Given? Yes    Education Handout Provided Previously provided    Muscles Treated Head and Neck Upper trapezius;Levator scapulae    Muscles Treated Upper Quadrant Supraspinatus;Infraspinatus    Dry Needling Comments Rt only    Upper Trapezius Response Twitch reponse elicited;Palpable increased muscle length    Levator Scapulae Response Twitch response elicited;Palpable increased muscle length    Supraspinatus Response Palpable increased muscle length    Infraspinatus Response Palpable increased muscle length                     PT Short Term Goals - 12/22/20 1223       PT SHORT TERM GOAL #1   Title Patient will be independent with HEP for continued progression at home.    Status Achieved      PT SHORT TERM GOAL #2   Title Patient will demonstrate at least 80 degrees Rt shoulder abduction to more readily perform ADLs.               PT Long Term Goals - 11/03/20 1558       PT LONG TERM GOAL #1   Title Patient will be independent with advanced HEP for long term management of symptoms post D/C.    Time 8    Period Weeks    Status New    Target Date 12/29/20      PT LONG TERM GOAL #2   Title Patient will demonstrate Rt shoulder IR to L3 to more readily performing dressing and hygiene tasks.    Baseline functionally to Rt greater trochanter    Time 8    Period Weeks    Status New    Target Date 12/29/20       PT LONG TERM GOAL #3   Title Patient will score >/= 62 on FOTO to indicate improved overall function.    Baseline 55    Time 8    Period  Weeks    Status New    Target Date 12/01/20      PT LONG TERM GOAL #4   Title Patient will demonstrate 120 degrees Rt shoulder flexion and abduction for improved reaching and lifting.    Time 8    Period Weeks    Status New    Target Date 12/29/20                   Plan - 12/22/20 1229     Clinical Impression Statement Pt with limited tolerance for exercise due to pain. Pt reports minimal improvement in symptoms.  She gets 3 days of reduced pain and improved sleep after DN each session.  Pt fatigues and becomes painful with more reps of exercise. Pt requires tactile cues for scapular alignment and to reduce activation of upper trap with movement against gravity.  Good twitch response of upper trap and levator scap with dry needling and improved tissue mobility after manual therapy today. Pt will benefit from skilled PT to address chronic Rt shoulder pain.    PT Treatment/Interventions ADLs/Self Care Home Management;Cryotherapy;Electrical Stimulation;Iontophoresis '4mg'$ /ml Dexamethasone;Moist Heat;Therapeutic activities;Therapeutic exercise;Neuromuscular re-education;Patient/family education;Manual techniques;Passive range of motion;Dry needling;Taping;Spinal Manipulations;Joint Manipulations    PT Next Visit Plan 1 more session probable.  Gentle mobility and DN/manual therapy.    PT Home Exercise Plan Access Code: [email or web address]    Consulted and Agree with Plan of Care Patient             Patient will benefit from skilled therapeutic intervention in order to improve the following deficits and impairments:  Decreased activity tolerance, Decreased range of motion, Decreased strength, Increased muscle spasms, Impaired flexibility, Impaired UE functional use, Improper body mechanics, Postural dysfunction, Pain  Visit Diagnosis: Chronic right  shoulder pain  Abnormal posture  Muscle weakness (generalized)  Cramp and spasm     Problem List Patient Active Problem List   Diagnosis Date Noted   Atrial fibrillation with RVR ([place])    Long term (current) use of anticoagulants    Long term current use of antiarrhythmic drug    AAA (abdominal aortic aneurysm) without rupture ([place]) 12/20/2019   AAA (abdominal aortic aneurysm) ([place]) 12/20/2019   Intolerance of continuous positive airway pressure (CPAP) ventilation 11/26/2019   Severe hypoxemia 08/21/2019   Severe obstructive sleep apnea 07/29/2019   Chronic obstructive pulmonary disease ([place]) 07/29/2019   RLS (restless legs syndrome) 07/29/2019   Dehydration with hyponatremia 09/13/2018   Bradycardia 09/13/2018   Glaucoma 09/13/2018   OA (osteoarthritis) of shoulder 05/16/2018   Incomplete emptying of bladder 05/17/2017   Labial adhesions 05/17/2017   Mixed stress and urge urinary incontinence 05/17/2017   Urethral sphincter deficiency, intrinsic (ISD) 05/17/2017   Aortic stenosis, mild 01/15/2016   Bilateral carotid bruits 05/11/2015   Hyponatremia 04/19/2015   Chronic anemia 04/19/2015   Panic attack 04/19/2015   Constipation 04/19/2015   Hyperkalemia 04/17/2015   Influenza A 04/16/2015   Proximal humerus fracture    Left patella fracture    Benign essential HTN    Esophageal reflux    Chronic heart failure with preserved ejection fraction (HFpEF) ([place])    Hypothyroidism    Lower GI bleed 04/08/2015   Hematochezia    Rectal bleeding 04/03/2015   Patellar fracture 03/06/2015   Left humeral fracture 11/04/2013   Rib fracture 11/04/2013   Hypothyroid 11/04/2013   Hypertension 11/04/2013   Right shoulder pain 11/04/2013   OSA (obstructive sleep apnea) 11/13/2008   BOOP (bronchiolitis obliterans with  organizing pneumonia) ([place]) 08/25/2008    Sigurd Sos, PT 12/22/20 1:03 PM   [facility] [address], [place], [place], [address] Phone: [phone]   Fax:  [phone]  Name: Lisa Mcclure MRN: [id number] Date of Birth: [birth year]

## 2020-12-29 ENCOUNTER — Other Ambulatory Visit: Payer: Self-pay

## 2020-12-29 ENCOUNTER — Ambulatory Visit: Payer: Medicare Other

## 2020-12-29 DIAGNOSIS — M25511 Pain in right shoulder: Secondary | ICD-10-CM | POA: Diagnosis not present

## 2020-12-29 DIAGNOSIS — R293 Abnormal posture: Secondary | ICD-10-CM

## 2020-12-29 DIAGNOSIS — R252 Cramp and spasm: Secondary | ICD-10-CM

## 2020-12-29 DIAGNOSIS — G8929 Other chronic pain: Secondary | ICD-10-CM | POA: Diagnosis not present

## 2020-12-29 DIAGNOSIS — M6281 Muscle weakness (generalized): Secondary | ICD-10-CM | POA: Diagnosis not present

## 2020-12-29 NOTE — Therapy (Signed)
[facility] [address], [place], [place], [address] Phone: [phone]   Fax:  [phone]  Physical Therapy Treatment  Patient Details  Name: Lisa Mcclure MRN: [id number] Date of Birth: Mar 19, 1933 Referring Provider (PT): Magnus Sinning, MD   Encounter Date: 12/29/2020   PT End of Session - 12/29/20 1310     Visit Number 8    PT Start Time 4008    PT Stop Time 1309    PT Time Calculation (min) 38 min    Activity Tolerance Patient limited by pain    Behavior During Therapy [facility] for tasks assessed/performed             Past Medical History:  Diagnosis Date   Anemia    h/o hemorrhoidal bleeding and blood transfusion   Cardiomegaly    Chronic diastolic CHF (congestive heart failure) (HCC)    Depression    Diverticulosis    DOE (dyspnea on exertion)    Esophageal reflux    GERD (gastroesophageal reflux disease)    Hypothyroidism    Insomnia    LBP (low back pain)    OAB (overactive bladder)    Obesity    OSA (obstructive sleep apnea)    Osteoarthritis    Rheumatoid arthritis(714.0)    Shoulder pain, bilateral    Unspecified essential hypertension    Venous insufficiency     Past Surgical History:  Procedure Laterality Date   ABDOMINAL AORTIC ENDOVASCULAR STENT GRAFT N/A 12/20/2019   Procedure: Aortogram including catheter selection of aorta and bilateral iliac arteriogram, Endovascular repair of infrarenal abdominal aortic aneurysm with bifurcated stent graft (26 mm x 14 x 12 main body, right bell bottom with a 20 mm x 10 cm piece, and left bell bottom with a 16 mm x 12 cm piece) ;  Surgeon: Marty Heck, MD;  Location: [place];  Service: Vascular;  Laterality: N/A;   APPENDECTOMY  1953   bladder abduction-1996  1996   breast biopsy Right 1980   CARDIOVERSION N/A 12/24/2019   Procedure: CARDIOVERSION;  Surgeon: Adrian Prows, MD;  Location: [facility] ENDOSCOPY;  Service: Cardiovascular;   Laterality: N/A;   [place] N/A 04/10/2015   Procedure: FLEXIBLE SIGMOIDOSCOPY;  Surgeon: Arta Silence, MD;  Location: [facility] ENDOSCOPY;  Service: Endoscopy;  Laterality: N/A;   knee arthroscopy Right 1996, 2010   KNEE ARTHROSCOPY W/ AUTOGENOUS CARTILAGE IMPLANTATION (ACI) PROCEDURE Left 1994, 1995   REFRACTIVE SURGERY  01/2020   SHOULDER SURGERY  1990   TEE WITHOUT CARDIOVERSION N/A 12/24/2019   Procedure: TRANSESOPHAGEAL ECHOCARDIOGRAM (TEE);  Surgeon: Adrian Prows, MD;  Location: [place];  Service: Cardiovascular;  Laterality: N/A;   [place] Bilateral 12/20/2019   Procedure: Ultrasound-guided access of bilateral common femoral arteries for delivery of endograft and percutaneous closure;  Surgeon: Marty Heck, MD;  Location: [place];  Service: Vascular;  Laterality: Bilateral;   VESICOVAGINAL FISTULA CLOSURE W/ [place]    There were no vitals filed for this visit.   Subjective Assessment - 12/29/20 1229     Subjective I get a couple of days of relief after DN and then it returns to being tight and painful.  My Rt shoulder feels 50% better overall.    Currently in Pain?  Yes    Pain Score 5     Pain Location Shoulder    Pain Orientation Right    Pain Descriptors / Indicators Aching    Pain Type Chronic pain    Pain Onset More than a month ago    Pain Frequency Constant    Aggravating Factors  pushing walker, using Rt UE for mouse, reaching    Pain Relieving Factors ice/heat, rest                [facility] PT Assessment - 12/29/20 0001       Assessment   Medical Diagnosis M25.511,G89.29 (ICD-10-CM) - Chronic right shoulder pain  M19.011 (ICD-10-CM) - Arthritis of right glenohumeral joint  M79.18 (ICD-10-CM) - Myofascial pain syndrome    Referring Provider (PT) Magnus Sinning, MD     Hand Dominance Right    Prior Therapy Yes      Precautions   Precautions [place] residence    Home Layout One level      Prior Function   Level of Independence Independent      Cognition   Overall Cognitive Status Within Functional Limits for tasks assessed      Observation/Other Assessments   Focus on Therapeutic Outcomes (FOTO)  23      Posture/Postural Control   Posture/Postural Control Postural limitations    Postural Limitations Forward head;Increased thoracic kyphosis;Rounded Shoulders      AROM   Right Shoulder Flexion 95 Degrees    Right Shoulder Internal Rotation --   To Rt lateral hip                          [facility] Adult PT Treatment/Exercise - 12/29/20 0001       Manual Therapy   Manual therapy comments skilled palpation and assessment of tissues before, during, and after dry needling    Soft tissue mobilization elongation to Rt upper trap, posterior shoulder and scapular muscles              Trigger Point Dry Needling - 12/29/20 0001     Consent Given? Yes    Education Handout Provided Previously provided    Muscles Treated Head and Neck Upper trapezius;Levator scapulae    Muscles Treated Upper Quadrant Supraspinatus;Infraspinatus    Dry Needling Comments Rt only    Upper Trapezius Response Twitch reponse elicited;Palpable increased muscle length    Levator Scapulae Response Twitch response elicited;Palpable increased muscle length    Supraspinatus Response Palpable increased muscle length    Infraspinatus Response Palpable increased muscle length                     PT Short Term Goals - 12/22/20 1223       PT SHORT TERM GOAL #1   Title Patient will be independent with HEP for continued progression at home.    Status Achieved      PT SHORT TERM GOAL #2   Title Patient will demonstrate at least 80 degrees Rt shoulder abduction to more readily perform ADLs.                PT Long Term Goals - 12/29/20 1234       PT LONG TERM GOAL #1   Title Patient will be independent with advanced HEP for long term management of symptoms post D/C.    Status Achieved      PT  LONG TERM GOAL #2   Title Patient will demonstrate Rt shoulder IR to L3 to more readily performing dressing and hygiene tasks.    Baseline functionally to Rt greater trochanter    Status Not Met      PT LONG TERM GOAL #3   Title Patient will score >/= 62 on FOTO to indicate improved overall function.    Status Not Met      PT LONG TERM GOAL #4   Title Patient will demonstrate 120 degrees Rt shoulder flexion and abduction for improved reaching and lifting.    Baseline 90- pain with this    Status Partially Met      PT LONG TERM GOAL #5   Title --    Baseline --                   Plan - 12/29/20 1242     Clinical Impression Statement Pt reports 50% overall improvement in Rt shoulder symptoms since the start of care. Pt with limited tolerance for exercise due to pain. No significant change in Rt shoulder A/ROM since the start of care.  Pt is independent and compliant in HEP at home.  Good twitch response of upper trap and levator scap with dry needling and improved tissue mobility after manual therapy today. Pt will D/C to HEP today.    PT Next Visit Plan D/C PT to HEP    PT Home Exercise Plan Access Code: [email or web address]    Consulted and Agree with Plan of Care Patient             Patient will benefit from skilled therapeutic intervention in order to improve the following deficits and impairments:  Decreased activity tolerance  Visit Diagnosis: Chronic right shoulder pain  Abnormal posture  Muscle weakness (generalized)  Cramp and spasm     Problem List Patient Active Problem List   Diagnosis Date Noted   Atrial fibrillation with RVR ([place])    Long term (current) use of anticoagulants    Long term current use of antiarrhythmic drug    AAA (abdominal aortic  aneurysm) without rupture ([place]) 12/20/2019   AAA (abdominal aortic aneurysm) ([place]) 12/20/2019   Intolerance of continuous positive airway pressure (CPAP) ventilation 11/26/2019   Severe hypoxemia 08/21/2019   Severe obstructive sleep apnea 07/29/2019   Chronic obstructive pulmonary disease ([place]) 07/29/2019   RLS (restless legs syndrome) 07/29/2019   Dehydration with hyponatremia 09/13/2018   Bradycardia 09/13/2018   Glaucoma 09/13/2018   OA (osteoarthritis) of shoulder 05/16/2018   Incomplete emptying of bladder 05/17/2017   Labial adhesions 05/17/2017   Mixed stress and urge urinary incontinence 05/17/2017   Urethral sphincter deficiency, intrinsic (ISD) 05/17/2017   Aortic stenosis, mild 01/15/2016   Bilateral carotid bruits 05/11/2015   Hyponatremia 04/19/2015   Chronic anemia 04/19/2015   Panic attack 04/19/2015   Constipation 04/19/2015   Hyperkalemia 04/17/2015   Influenza A 04/16/2015   Proximal humerus fracture    Left patella fracture    Benign essential HTN    Esophageal reflux    Chronic heart failure with preserved ejection fraction (HFpEF) ([place])    Hypothyroidism    Lower GI bleed 04/08/2015   Hematochezia    Rectal bleeding 04/03/2015   Patellar fracture 03/06/2015   Left humeral fracture 11/04/2013   Rib fracture 11/04/2013   Hypothyroid 11/04/2013   Hypertension 11/04/2013   Right shoulder pain 11/04/2013   OSA (obstructive sleep apnea) 11/13/2008   BOOP (bronchiolitis obliterans with organizing pneumonia) (  [place]) 08/25/2008   PHYSICAL THERAPY DISCHARGE SUMMARY  Visits from Start of Care: 8  Current functional level related to goals / functional outcomes: See above for current status.     Remaining deficits: Limited use and function of Rt shoulder due to pain and functional weakness.   Education / Equipment: HEP   Patient agrees to discharge. Patient goals were partially met. Patient is being discharged due to being pleased with the current  functional level.   Sigurd Sos, PT 12/29/20 1:12 PM   [facility] [address], [place], [place], [address] Phone: [phone]   Fax:  [phone]  Name: Lisa Mcclure MRN: [id number] Date of Birth: [birth year]

## 2021-01-13 ENCOUNTER — Telehealth: Payer: Self-pay | Admitting: Physical Medicine and Rehabilitation

## 2021-01-13 NOTE — Telephone Encounter (Signed)
Right shoulder injection on 7/18. Ok to repeat?

## 2021-01-13 NOTE — Telephone Encounter (Signed)
Left message #1

## 2021-01-13 NOTE — Telephone Encounter (Signed)
Patient called needing to schedule an appointment with Dr Ernestina Patches for her right shoulder. The number to contact patient is [phone]

## 2021-01-19 NOTE — Telephone Encounter (Signed)
Scheduled for 10/25

## 2021-01-19 NOTE — Telephone Encounter (Signed)
Pt called asking about appt.   CB [phone]

## 2021-01-21 DIAGNOSIS — H353114 Nonexudative age-related macular degeneration, right eye, advanced atrophic with subfoveal involvement: Secondary | ICD-10-CM | POA: Diagnosis not present

## 2021-01-21 DIAGNOSIS — H43393 Other vitreous opacities, bilateral: Secondary | ICD-10-CM | POA: Diagnosis not present

## 2021-01-21 DIAGNOSIS — H353221 Exudative age-related macular degeneration, left eye, with active choroidal neovascularization: Secondary | ICD-10-CM | POA: Diagnosis not present

## 2021-01-21 DIAGNOSIS — H43813 Vitreous degeneration, bilateral: Secondary | ICD-10-CM | POA: Diagnosis not present

## 2021-01-26 DIAGNOSIS — I5032 Chronic diastolic (congestive) heart failure: Secondary | ICD-10-CM | POA: Diagnosis not present

## 2021-01-26 DIAGNOSIS — I10 Essential (primary) hypertension: Secondary | ICD-10-CM | POA: Diagnosis not present

## 2021-01-26 DIAGNOSIS — Z23 Encounter for immunization: Secondary | ICD-10-CM | POA: Diagnosis not present

## 2021-01-26 DIAGNOSIS — G2581 Restless legs syndrome: Secondary | ICD-10-CM | POA: Diagnosis not present

## 2021-01-26 DIAGNOSIS — N3946 Mixed incontinence: Secondary | ICD-10-CM | POA: Diagnosis not present

## 2021-01-26 DIAGNOSIS — G4733 Obstructive sleep apnea (adult) (pediatric): Secondary | ICD-10-CM | POA: Diagnosis not present

## 2021-01-26 DIAGNOSIS — M25511 Pain in right shoulder: Secondary | ICD-10-CM | POA: Diagnosis not present

## 2021-02-01 ENCOUNTER — Telehealth: Payer: Self-pay | Admitting: Physical Medicine and Rehabilitation

## 2021-02-01 NOTE — Telephone Encounter (Signed)
Pt called stating she needs to cancel her 02/02/21 appt and she will CB when she needs to be seen.   [phone]

## 2021-02-02 ENCOUNTER — Ambulatory Visit: Payer: Medicare Other | Admitting: Physical Medicine and Rehabilitation

## 2021-02-02 NOTE — Telephone Encounter (Signed)
Appointment cancelled

## 2021-02-09 ENCOUNTER — Telehealth: Payer: Self-pay | Admitting: Pharmacist

## 2021-02-10 NOTE — Telephone Encounter (Signed)
Discussed with Blenda Bridegroom, agree with above recommendations.  Recommend reevaluating need for office visit in a few days after taking additional Lasix.

## 2021-02-15 ENCOUNTER — Telehealth: Payer: Self-pay | Admitting: Physical Medicine and Rehabilitation

## 2021-02-15 NOTE — Telephone Encounter (Signed)
Pt called to reschedule her last appt.   CB [phone]

## 2021-02-16 ENCOUNTER — Encounter: Payer: Self-pay | Admitting: Student

## 2021-02-16 ENCOUNTER — Ambulatory Visit: Payer: Medicare Other | Admitting: Student

## 2021-02-16 ENCOUNTER — Other Ambulatory Visit: Payer: Self-pay

## 2021-02-16 VITALS — BP 107/49 | HR 61 | Temp 98.3°F | Ht 63.0 in | Wt 211.0 lb

## 2021-02-16 DIAGNOSIS — I10 Essential (primary) hypertension: Secondary | ICD-10-CM | POA: Diagnosis not present

## 2021-02-16 DIAGNOSIS — I5032 Chronic diastolic (congestive) heart failure: Secondary | ICD-10-CM

## 2021-02-16 DIAGNOSIS — R051 Acute cough: Secondary | ICD-10-CM

## 2021-02-16 MED ORDER — APIXABAN 2.5 MG PO TABS: 2.5000 mg | ORAL_TABLET | Freq: Two times a day (BID) | ORAL | 3 refills | Status: DC

## 2021-02-16 NOTE — Progress Notes (Signed)
Primary Physician/Referring:  [name], MD  Patient ID: [name], female    DOB: Aug 23, 1932, 87 y.o.   MRN: [id number]  Chief Complaint  Patient presents with   Hypertension   Follow-up   Congestive Heart Failure   HPI:    JANNELLY BERGREN  is a 87 y.o.  female  with past medical history of obesity, chronic diastolic CHF, hypertensive heart disease, paroxysmal atrial fibrillation, anemia, hemorrhoids, diverticulosis, esophageal reflux, depression, OSA intolerant to CPAP on nocturnal 02, rheumatoid arthritis, and venous insufficiency. CT scan of the chest 03/2016 without definitive ILD. LM and 2 vessel CAD, mild calcification on mitral and aortic valve. S/p AAA repair on 12/20/2019.  Her last episode of atrial fibrillation was in August 2021.    Patient presents For visit with concerns of worsening bilateral lower extremity swelling.  She called the office 02/09/2021 with shortness of breath, lower extremity swelling, and cough following recent flu infection.  At that time patient was advised to increase Lasix dosing to 20 mg twice daily.  She now presents for 1 week follow-up. Patient is presently taking Lasix 40 mg once daily for the last several days with minimal improvement of leg edema. She also continues to have shortness of breath, cough, and congestion.  Patient continues to follow with vascular surgery for regular follow-up.  Past Medical History:  Diagnosis Date   Anemia    h/o hemorrhoidal bleeding and blood transfusion   Cardiomegaly    Chronic diastolic CHF (congestive heart failure) (HCC)    Depression    Diverticulosis    DOE (dyspnea on exertion)    Esophageal reflux    GERD (gastroesophageal reflux disease)    Hypothyroidism    Insomnia    LBP (low back pain)    OAB (overactive bladder)    Obesity    OSA (obstructive sleep apnea)    Osteoarthritis    Rheumatoid arthritis(714.0)    Shoulder pain, bilateral    Unspecified essential hypertension     Venous insufficiency    Past Surgical History:  Procedure Laterality Date   ABDOMINAL AORTIC ENDOVASCULAR STENT GRAFT N/A 12/20/2019   Procedure: Aortogram including catheter selection of aorta and bilateral iliac arteriogram, Endovascular repair of infrarenal abdominal aortic aneurysm with bifurcated stent graft (26 mm x 14 x 12 main body, right bell bottom with a 20 mm x 10 cm piece, and left bell bottom with a 16 mm x 12 cm piece) ;  Surgeon: Marty Heck, MD;  Location: [place];  Service: Vascular;  Laterality: N/A;   APPENDECTOMY  1953   bladder abduction-1996  1996   breast biopsy Right 1980   CARDIOVERSION N/A 12/24/2019   Procedure: CARDIOVERSION;  Surgeon: Adrian Prows, MD;  Location[facility] ENDOSCOPY;  Service: Cardiovascular;  Laterality: N/A;   [place] N/A 04/10/2015   Procedure: FLEXIBLE SIGMOIDOSCOPY;  Surgeon: Arta Silence, MD;  Location: [facility] ENDOSCOPY;  Service: Endoscopy;  Laterality: N/A;   knee arthroscopy Right 1996, 2010   KNEE ARTHROSCOPY W/ AUTOGENOUS CARTILAGE IMPLANTATION (ACI) PROCEDURE Left 1994, 1995   REFRACTIVE SURGERY  01/2020   SHOULDER SURGERY  1990   TEE WITHOUT CARDIOVERSION N/A 12/24/2019   Procedure: TRANSESOPHAGEAL ECHOCARDIOGRAM (TEE);  Surgeon: Adrian Prows, MD;  Location: [place];  Service: Cardiovascular;  Laterality: N/A;   [place]  FOR VASCULAR ACCESS Bilateral 12/20/2019   Procedure: Ultrasound-guided access of bilateral common femoral arteries for delivery of endograft and percutaneous closure;  Surgeon: Marty Heck, MD;  Location: [facility] OR;  Service: Vascular;  Laterality: Bilateral;   VESICOVAGINAL FISTULA CLOSURE W/ TAH     WRIST SURGERY  1967   Family History  Problem Relation Age of Onset   COPD Father    Lung cancer Mother    Heart attack Maternal Grandmother 56   Breast  cancer Maternal Aunt    Lung cancer Son     Social History   Tobacco Use   Smoking status: Former    Packs/day: 0.20    Years: 10.00    Pack years: 2.00    Types: Cigarettes    Quit date: 04/11/1984    Years since quitting: 36.8   Smokeless tobacco: Never  Substance Use Topics   Alcohol use: Yes    Alcohol/week: 0.0 standard drinks    Comment: cocktail once a week   Marital Status: Widowed  ROS  Review of Systems  Cardiovascular:  Positive for dyspnea on exertion (stable) and leg swelling. Negative for chest pain, orthopnea, palpitations, paroxysmal nocturnal dyspnea and syncope.  Respiratory:  Positive for cough. Negative for hemoptysis.   Genitourinary:  Positive for bladder incontinence and urgency.  Objective  Blood pressure (!) 107/49, pulse 61, temperature 98.3 F (36.8 C), height 5' 3"  (1.6 m), weight 211 lb (95.7 kg), SpO2 96 %.  Vitals with BMI 02/16/2021 11/16/2020 11/10/2020  Height 5' 3"  5' 3"  5' 3"   Weight 211 lbs 206 lbs 10 oz 205 lbs  BMI 37.39 59.97 74.14  Systolic 239 532 023  Diastolic 49 50 85  Pulse 61 67 60     Physical Exam Vitals reviewed.  Constitutional:      General: She is not in acute distress.    Appearance: She is well-developed.  Cardiovascular:     Rate and Rhythm: Normal rate. Rhythm regularly irregular.     Pulses: Intact distal pulses.          Carotid pulses are 2+ on the right side and 2+ on the left side.      Radial pulses are 2+ on the right side and 2+ on the left side.     Heart sounds: S1 normal and S2 normal. Murmur heard.  Harsh midsystolic murmur is present with a grade of 3/6 at the upper right sternal border radiating to the apex.    No gallop.     Comments: No JVD.  Pulmonary:     Effort: Pulmonary effort is normal. No accessory muscle usage or respiratory distress.     Breath sounds: Rales (bilateral bases) present. No wheezing or rhonchi.  Musculoskeletal:        General: Tenderness present.     Right lower leg: Edema  (1+ pitting) present.     Left lower leg: Edema (1+ pitting) present.     Comments: Limited range of motion, bilateral shoulders  Neurological:     Mental Status: She is alert.   Laboratory examination:   Recent Labs    04/30/20 1200 06/08/20 1607 08/11/20 1525 10/28/20 1619 11/12/20 1555  NA 137   < > 139 140 140  K 4.4   < > 4.9 4.8 4.8  CL 96   < > 99 96 100  CO2 27   < > 24 25 27   GLUCOSE 91   < > 88 93 93  BUN 56*   < >  25 38* 43*  CREATININE 1.35*   < > 1.15* 1.20* 1.53*  CALCIUM 9.0   < > 9.0 9.1 8.8  GFRNONAA 36*  --   --   --   --   GFRAA 41*  --   --   --   --    < > = values in this interval not displayed.   CrCl cannot be calculated (Patient's most recent lab result is older than the maximum 21 days allowed.).  CMP Latest Ref Rng & Units 11/12/2020 10/28/2020 08/11/2020  Glucose 65 - 99 mg/dL 93 93 88  BUN 8 - 27 mg/dL 43(H) 38(H) 25  Creatinine 0.57 - 1.00 mg/dL 1.53(H) 1.20(H) 1.15(H)  Sodium 134 - 144 mmol/L 140 140 139  Potassium 3.5 - 5.2 mmol/L 4.8 4.8 4.9  Chloride 96 - 106 mmol/L 100 96 99  CO2 20 - 29 mmol/L 27 25 24   Calcium 8.7 - 10.3 mg/dL 8.8 9.1 9.0  Total Protein 6.5 - 8.1 g/dL - - -  Total Bilirubin 0.3 - 1.2 mg/dL - - -  Alkaline Phos 38 - 126 U/L - - -  AST 15 - 41 U/L - - -  ALT 0 - 44 U/L - - -   CBC Latest Ref Rng & Units 08/04/2020 12/25/2019 12/24/2019  WBC 3.4 - 10.8 x10E3/uL 6.6 8.8 10.5  Hemoglobin 11.1 - 15.9 g/dL 11.8 9.1(L) 9.1(L)  Hematocrit 34.0 - 46.6 % 36.2 30.7(L) 30.0(L)  Platelets 150 - 450 x10E3/uL 393 309 317   Lipid Panel    Component Value Date/Time   CHOL 126 03/14/2019 1121   TRIG 82 03/14/2019 1121   HDL 49 03/14/2019 1121   CHOLHDL 5.1 CALC 12/19/2006 0916   VLDL 26 12/19/2006 0916   LDLCALC 61 03/14/2019 1121   LDLDIRECT 159.2 12/19/2006 0916   HEMOGLOBIN A1C No results found for: HGBA1C, MPG TSH No results for input(s): TSH in the last 8760 hours.  External labs:   07/20/2020: Glucose 84, BUN 30,  creatinine 1.22, GFR 48, sodium 137, potassium 4.9,  BNP 321  Cholesterol, total 164.000 m 06/21/2019 HDL 46 MG/DL 06/21/2019 LDL 99.000 mg 06/21/2019 Triglycerides 93.000 06/21/2019  04/14/2020: Serum glucose 86 mg, BUN 22, creatinine 1.13, EGFR 44 mL, sodium 138, potassium 4.3.  CMP otherwise normal. Hb 10.2/HCT 31.5, platelets 383.  02/17/2020:  Hemoglobin 11.1, hematocrit 34.2, platelets 383 BUN 26, creatinine 0.9, EGFR 59, sodium 136, potassium 4.6  01/07/2020: BUN 29, GFR 57, creatinine 0.93, BUN/CR 31, sodium 133, potassium 4.6, chloride 93, BMP otherwise normal  Cholesterol, total 164.000 m 06/21/2019 Triglycerides 93.000 06/21/2019 HDL 46 MG/DL 06/21/2019 LDL 99.000 mg 06/21/2019  Glucose Random 87.000 mg 06/21/2019 BUN 25.000 mg 06/21/2019 Creatinine, Serum 0.800 mg/ 06/21/2019  TSH 3.220 micr 06/21/2019  AST 18, ALT 11 09/14/2018  FOBT: Normal 04/24/2018  MicroAlbumin Urine 13.000 03/23/2017 MicroAlbumin/Creat 37.9 MG/ 03/23/2017 Allergies   Allergies  Allergen Reactions   Statins Other (See Comments)    Muscle aches and INTERNAL BLEEDING   Gabapentin Other (See Comments)    "Made me loopy"   Methocarbamol Other (See Comments)    "Made me loopy"    Medications Prior to Visit:   Outpatient Medications Prior to Visit  Medication Sig Dispense Refill   amiodarone (PACERONE) 100 MG tablet TAKE ONE TABLET BY MOUTH ONCE DAILY 90 tablet 2   amLODipine (NORVASC) 10 MG tablet TAKE ONE TABLET BY MOUTH AT NOON 90 tablet 1   Bempedoic Acid-Ezetimibe (NEXLIZET) 180-10 MG TABS Take  1 capsule by mouth daily. (Patient taking differently: Take 1 capsule by mouth daily. Taking it at bedtime) 30 tablet 3   carvedilol (COREG) 12.5 MG tablet TAKE ONE TABLET BY MOUTH AT BREAKFAST AND AT BEDTIME 180 tablet 2   Cholecalciferol (VITAMIN D3) 125 MCG (5000 UT) CAPS Take 5,000 Units by mouth daily with breakfast. Taking it at lunchtime     diclofenac sodium (VOLTAREN) 1 % GEL Apply 2.25 g  topically 3 (three) times daily as needed (for pain).      furosemide (LASIX) 20 MG tablet Take 1 tablet (20 mg total) by mouth daily as needed for fluid or edema. Take Lasix 20 mg once daily with an additional dose as needed for fluid overload. 90 tablet 3   HYDROcodone-acetaminophen (NORCO) 7.5-325 MG tablet Take 1 tablet by mouth every 6 (six) hours as needed for moderate pain. 20 tablet 0   isosorbide-hydrALAZINE (BIDIL) 20-37.5 MG tablet Take 1 tablet by mouth 3 (three) times daily.     latanoprost (XALATAN) 0.005 % ophthalmic solution Place 1 drop into both eyes at bedtime.     levothyroxine (SYNTHROID) 125 MCG tablet Take 125 mcg by mouth daily before breakfast.      losartan (COZAAR) 100 MG tablet Take 1 tablet (100 mg total) by mouth daily. 180 tablet 1   Multiple Vitamins-Minerals (ICAPS AREDS 2 PO) Take by mouth.     omeprazole (PRILOSEC) 20 MG capsule Take 1 capsule (20 mg total) by mouth daily. 30 capsule 11   rOPINIRole (REQUIP) 1 MG tablet Take 1 mg by mouth See admin instructions. Take 1 mg by mouth once a day between 3 PM-4 PM and an additional 1 mg in the evening, if no relief  12   spironolactone (ALDACTONE) 25 MG tablet TAKE 1/2 TABLET BY MOUTH ONCE DAILY 15 tablet 3   apixaban (ELIQUIS) 5 MG TABS tablet Take 1 tablet (5 mg total) by mouth 2 (two) times daily. 60 tablet 1   No facility-administered medications prior to visit.   Final Medications at End of Visit    Current Meds  Medication Sig   amiodarone (PACERONE) 100 MG tablet TAKE ONE TABLET BY MOUTH ONCE DAILY   amLODipine (NORVASC) 10 MG tablet TAKE ONE TABLET BY MOUTH AT NOON   Bempedoic Acid-Ezetimibe (NEXLIZET) 180-10 MG TABS Take 1 capsule by mouth daily. (Patient taking differently: Take 1 capsule by mouth daily. Taking it at bedtime)   carvedilol (COREG) 12.5 MG tablet TAKE ONE TABLET BY MOUTH AT BREAKFAST AND AT BEDTIME   Cholecalciferol (VITAMIN D3) 125 MCG (5000 UT) CAPS Take 5,000 Units by mouth daily with  breakfast. Taking it at lunchtime   diclofenac sodium (VOLTAREN) 1 % GEL Apply 2.25 g topically 3 (three) times daily as needed (for pain).    furosemide (LASIX) 20 MG tablet Take 1 tablet (20 mg total) by mouth daily as needed for fluid or edema. Take Lasix 20 mg once daily with an additional dose as needed for fluid overload.   HYDROcodone-acetaminophen (NORCO) 7.5-325 MG tablet Take 1 tablet by mouth every 6 (six) hours as needed for moderate pain.   isosorbide-hydrALAZINE (BIDIL) 20-37.5 MG tablet Take 1 tablet by mouth 3 (three) times daily.   latanoprost (XALATAN) 0.005 % ophthalmic solution Place 1 drop into both eyes at bedtime.   levothyroxine (SYNTHROID) 125 MCG tablet Take 125 mcg by mouth daily before breakfast.    losartan (COZAAR) 100 MG tablet Take 1 tablet (100 mg total) by mouth daily.  Multiple Vitamins-Minerals (ICAPS AREDS 2 PO) Take by mouth.   omeprazole (PRILOSEC) 20 MG capsule Take 1 capsule (20 mg total) by mouth daily.   rOPINIRole (REQUIP) 1 MG tablet Take 1 mg by mouth See admin instructions. Take 1 mg by mouth once a day between 3 PM-4 PM and an additional 1 mg in the evening, if no relief   spironolactone (ALDACTONE) 25 MG tablet TAKE 1/2 TABLET BY MOUTH ONCE DAILY   [DISCONTINUED] apixaban (ELIQUIS) 5 MG TABS tablet Take 1 tablet (5 mg total) by mouth 2 (two) times daily.    Radiology:   CT Abdomen and Pelvis W Contrast 12/20/2019:  1. Large Infrarenal Abdominal Aortic Aneurysm measuring up to 5.7 cm with mild perianeurysmal inflammation. Differential considerations include impending AAA rupture, mycotic AAA, symptomatic AAA. Recommend urgent transfer to emergency department and Vascular Surgery consultation. Aortic aneurysm NOS (ICD10-I71.9). Aortic Atherosclerosis (ICD10-I70.0). 2. No other acute or inflammatory process identified. 5 cm fat containing left pelvic hernia. Large bowel diverticulosis. 3. Simple appearing 4 cm left ovarian cyst, recommend follow-up  Ultrasound in 6-12 months  Chest X-ray 12/20/2019:   Diffuse cardiac enlargement. Emphysematous changes in the lungs. Coarse interstitial pattern to the lungs could represent fibrosis or edema. No focal consolidation. No pleural effusions. No pneumothorax. Calcified and tortuous aorta. Degenerative changes in the shoulders and spine. Old rib fractures. Similar appearance to previous study. IMPRESSION: 1. Cardiac enlargement. 2. Coarse interstitial pattern to the lungs could represent fibrosis or edema.  Abdominal X-Ray 12/20/2019:  Interval placement of aorto iliac stent graft. Nonobstructive bowel gas pattern.  Cardiac Studies:   Lexiscan myoview stress test 12/08/2017: 1. Lexiscan stress test was performed. Exercise capacity was not assessed. Stress symptoms included abdominal pain. Blood pressure was normal. The resting and stress electrocardiogram demonstrated normal sinus rhythm, normal resting conduction, no resting arrhythmias and normal rest repolarization. 2. The overall quality of the study is good. There is no evidence of abnormal lung activity. Stress and rest SPECT images demonstrate homogeneous tracer distribution throughout the myocardium. Gated SPECT imaging reveals normal myocardial thickening and wall motion. The left ventricular ejection fraction was normal (54%). 3. Low risk study  Renal artery duplex 09/27/2017: Hemodynamically significant stenosis bilaterally. Renal length is within normal limits for both kidneys. There is an incidental 3.9x3.4x3.4 cm proximal and mid abdominal aortic aneurysm. Recommend dedicated abdominal aortic duplex scan to further define AAA.  Abdominal Aortic Duplex  01/01/2019: Moderate dilatation of the abdominal aorta is noted in the proximal, mid and distal aorta. An abdominal aortic aneurysm measuring 4.5 x 4.52 x 4.52 cm is seen.  Recheck in 6 months for stability of the aneurysm.  TEE 12/24/2019:  1. Left ventricular ejection  fraction, by estimation, is 50 to 55%. The left ventricle has low normal function. Left ventricular diastolic function could not be evaluated.   2. Right ventricular systolic function is normal. The right ventricular size is normal.   3. Left atrial size was moderately dilated. No left atrial/left atrial appendage thrombus was detected. The LAA emptying velocity was 30 cm/s.   4. The mitral valve is normal in structure. Mild mitral valve regurgitation. No evidence of mitral stenosis.   5. The aortic valve is calcified. Aortic valve regurgitation is not visualized. Mild aortic valve stenosis. Aortic valve mean gradient measures 10.8 mmHg.   6. There is Moderate (Grade III) layered plaque involving the ascending, transverse and descending aorta.   Echocardiogram 05/27/2020: Left ventricle cavity is normal in size. Moderate concentric hypertrophy  of the left ventricle. Normal global wall motion. Normal LV systolic function with EF 56%. Doppler evidence of grade II (pseudonormal) diastolic dysfunction, elevated LAP.  Left atrial cavity is mildly dilated. Trileaflet aortic valve with mild aortic valve leaflet calcification. Mild aortic valve stenosis. Vmax 2.6 m/sec, mean PG 16 mmHg, AVA 1.2 cm2 by continuity equation Mild mitral valve leaflet calcification. Mild (Grade I) mitral regurgitation. Mild tricuspid regurgitation. Estimated pulmonary artery systolic pressure 31 mmHg.  Compared to previous study on 01/25/2019, trivial change in aortic valve mean PG from 12 to 16 mmHg, PASP from 25 mmHg to 31 mmHg.   EKG  02/16/2021: Sinus rhythm with frequent PVCs in a trigeminal pattern and a rate of 90 bpm.  Normal axis.  Poor R wave progression, cannot exclude anteroseptal infarct old.  05/31/2020: Sinus rhythm with borderline first-degree AV block at rate of 61 bpm, left atrial enlargement, normal axis.  Poor R wave progression, probably normal variant but cannot exclude anteroseptal infarct old.  No evidence  of ischemia, normal QT interval. No significant change from 01/02/2020.  Assessment     ICD-10-CM   1. Chronic diastolic (congestive) heart failure (HCC)  I50.32 EKG [id number]    Basic metabolic panel    Brain natriuretic peptide    2. Primary hypertension  I10 EKG 12-Lead    3. Acute cough  R05.1 DG Chest 2 View     This patients CHA2DS2-VASc Score 6 (CHF, HTN, vasc, age, F) and yearly risk of stroke 9.8%.  Meds ordered this encounter  Medications   apixaban (ELIQUIS) 2.5 MG TABS tablet    Sig: Take 1 tablet (2.5 mg total) by mouth 2 (two) times daily.    Dispense:  180 tablet    Refill:  3   Medications Discontinued During This Encounter  Medication Reason   apixaban (ELIQUIS) 5 MG TABS tablet    Recommendations:   LAKARA WEILAND  is a 87 y.o.  female  with past medical history of obesity, chronic diastolic CHF, hypertensive heart disease, paroxysmal atrial fibrillation, anemia, hemorrhoids, diverticulosis, esophageal reflux, depression, OSA intolerant to CPAP on nocturnal 02, rheumatoid arthritis, and venous insufficiency. CT scan of the chest 03/2016 without definitive ILD. LM and 2 vessel CAD, mild calcification on mitral and aortic valve. S/p AAA repair on 12/20/2019.    Patient presents For visit with concerns of worsening bilateral lower extremity swelling.  She called the office 02/09/2021 with shortness of breath, lower extremity swelling, and cough following recent flu infection.  At that time patient was advised to increase Lasix dosing to 20 mg twice daily.  She now presents for 1 week follow-up.  Patient continues to have bilateral lower leg edema, which appears relatively stable compared to last office visit.  However she does have rales in bilateral lower lung fields on exam.  We will obtain BMP and BNP.  Advised patient continue Lasix 40 mg in the morning and add an additional 20 mg in the afternoon for the next 3 days.  Patient may then return to Lasix 40 mg once daily.   Given recent history of flulike illness and bilateral rales on pulmonary exam today we will obtain chest x-ray.  In regard to atrial fibrillation, will reduce Eliquis to 2.5 mg p.o. twice daily as patient's creatinine is now 1.53 on most recent check.  EKG today does show ventricular trigeminy, which cannot be ruled out may be contributing to patient's symptoms of fatigue.  We will repeat EKG at next office  visit and can consider cardiac monitor to evaluate PVC burden if necessary in the future.  Follow-up in 2 weeks, sooner if needed, for swelling and results of testing.   Alethia Berthold, PA-C 02/16/2021, 2:01 PM Office: [phone]

## 2021-02-22 DIAGNOSIS — I5032 Chronic diastolic (congestive) heart failure: Secondary | ICD-10-CM | POA: Diagnosis not present

## 2021-02-22 DIAGNOSIS — R002 Palpitations: Secondary | ICD-10-CM | POA: Diagnosis not present

## 2021-02-22 DIAGNOSIS — I130 Hypertensive heart and chronic kidney disease with heart failure and stage 1 through stage 4 chronic kidney disease, or unspecified chronic kidney disease: Secondary | ICD-10-CM | POA: Diagnosis not present

## 2021-02-22 DIAGNOSIS — J069 Acute upper respiratory infection, unspecified: Secondary | ICD-10-CM | POA: Diagnosis not present

## 2021-02-22 DIAGNOSIS — N1832 Chronic kidney disease, stage 3b: Secondary | ICD-10-CM | POA: Diagnosis not present

## 2021-02-22 DIAGNOSIS — R059 Cough, unspecified: Secondary | ICD-10-CM | POA: Diagnosis not present

## 2021-03-01 ENCOUNTER — Other Ambulatory Visit: Payer: Self-pay

## 2021-03-01 ENCOUNTER — Ambulatory Visit (INDEPENDENT_AMBULATORY_CARE_PROVIDER_SITE_OTHER): Payer: Medicare Other | Admitting: Physical Medicine and Rehabilitation

## 2021-03-01 ENCOUNTER — Ambulatory Visit: Payer: Self-pay

## 2021-03-01 DIAGNOSIS — M19011 Primary osteoarthritis, right shoulder: Secondary | ICD-10-CM

## 2021-03-01 DIAGNOSIS — M25511 Pain in right shoulder: Secondary | ICD-10-CM | POA: Diagnosis not present

## 2021-03-01 DIAGNOSIS — G8929 Other chronic pain: Secondary | ICD-10-CM | POA: Diagnosis not present

## 2021-03-01 NOTE — Progress Notes (Signed)
Pt state right shoulder

## 2021-03-01 NOTE — Progress Notes (Signed)
Lisa Mcclure - 87 y.o. female MRN [id number]  Date of birth: 22-Dec-1932  Office Visit Note: Visit Date: 03/01/2021 PCP: [name], MD Referred by: [name], MD  Subjective: No chief complaint on file.  HPI:  Lisa Mcclure is a 87 y.o. female who comes in today  for planned repeat Right anesthetic glenohumeral arthrogram with fluoroscopic guidance.  The patient has failed conservative care including home exercise, medications, time and activity modification.  This injection will be diagnostic and hopefully therapeutic.  Please see requesting physician notes for further details and justification.  She does have end-stage osteoarthritis of the right glenohumeral joint.  She is not interested in any surgical procedures.  She gets 2 to 3 months of relief with the injections and they do vary which is normal.  She has recently completed a course of physical therapy with dry needling that did help to a degree.  Referring physician: Dr. Janie Morning  ROS Otherwise per HPI.  Assessment & Plan: Visit Diagnoses:    ICD-10-CM   1. Chronic right shoulder pain  M25.511 XR C-ARM NO REPORT   G89.29 Large Joint Inj: R glenohumeral    2. Arthritis of right glenohumeral joint  M19.011 XR C-ARM NO REPORT    Large Joint Inj: R glenohumeral      Plan: No additional findings.   Meds & Orders: No orders of the defined types were placed in this encounter.   Orders Placed This Encounter  Procedures   Large Joint Inj: R glenohumeral   XR C-ARM NO REPORT    Follow-up: Return for visit to requesting provider as needed.   Procedures: Large Joint Inj: R glenohumeral on 03/01/2021 2:23 PM Indications: pain and diagnostic evaluation Details: 22 G 3.5 in needle, fluoroscopy-guided anteromedial approach  Arthrogram: No  Medications: 40 mg triamcinolone acetonide 40 MG/ML; 5 mL bupivacaine 0.25 % Outcome: tolerated well, no immediate complications  There was excellent flow of  contrast producing a partial arthrogram of the glenohumeral joint. The patient did have relief of symptoms during the anesthetic phase of the injection. Procedure, treatment alternatives, risks and benefits explained, specific risks discussed. Consent was given by the patient. Immediately prior to procedure a time out was called to verify the correct patient, procedure, equipment, support staff and site/side marked as required. Patient was prepped and draped in the usual sterile fashion.         Clinical History: LUMBAR SPINE - COMPLETE 4+ VIEW   COMPARISON: Chest x-ray dated 03/05/2017.   FINDINGS: There is an age-indeterminate compression fracture of the T12 and T11 vertebral bodies. There is some mild height loss of L2 vertebral body. Degenerative changes are noted throughout the visualized lumbar spine. These are greatest at the lower lumbar segments were there is multilevel facet arthrosis and significant multilevel disc height loss. There appears to be a grade 1-2 anterolisthesis of L5 on S1. Diffuse osteopenia is noted.   IMPRESSION: 1. Age-indeterminate height loss of the T11, T12, and L2 vertebral bodies. Correlation with point tenderness is recommended. If there is clinical concern for an acute fracture this can be further evaluated with cross-sectional imaging. 2. Multilevel degenerative changes throughout the lumbar spine. 3. Grade 1-2 anterolisthesis of L5 on S1. 4. Osteopenia.     Electronically Signed By: Constance Holster M.D. On: 09/14/2018 19:25 ------- EXAM: PELVIS - 1-2 VIEW   COMPARISON:  None.   FINDINGS: There is no acute displaced fracture or dislocation. There is advanced osteoarthritis of the right hip.  There is mild-to-moderate osteoarthritis of the left hip. There are degenerative changes of the pubic symphysis. There is osteopenia.   IMPRESSION: 1. No acute displaced fracture or dislocation. 2. Advanced osteoarthritis of the right hip. 3.  Mild-to-moderate osteoarthritis of the left hip. 4. Osteopenia which limits detection of nondisplaced fractures.     Electronically Signed   By: Constance Holster M.D.   On: 09/14/2018 19:20     Objective:  VS:  HT:    WT:   BMI:     BP:   HR: bpm  TEMP: ( )  RESP:  Physical Exam Musculoskeletal:        General: Tenderness present.     Comments: Painful range of motion of the right shoulder with decreased range of motion particularly with abduction and extension.     Imaging: XR C-ARM NO REPORT  Result Date: 03/01/2021 Please see Notes tab for imaging impression.

## 2021-03-02 ENCOUNTER — Ambulatory Visit: Payer: Medicare Other | Admitting: Student

## 2021-03-02 MED ORDER — TRIAMCINOLONE ACETONIDE 40 MG/ML IJ SUSP
40.0000 mg | INTRAMUSCULAR | Status: AC | PRN
  Administered 2021-03-01: 40 mg via INTRA_ARTICULAR

## 2021-03-02 MED ORDER — BUPIVACAINE HCL 0.25 % IJ SOLN
5.0000 mL | INTRAMUSCULAR | Status: AC | PRN
  Administered 2021-03-01: 5 mL via INTRA_ARTICULAR

## 2021-03-04 ENCOUNTER — Other Ambulatory Visit: Payer: Self-pay | Admitting: Student

## 2021-03-04 DIAGNOSIS — I5032 Chronic diastolic (congestive) heart failure: Secondary | ICD-10-CM

## 2021-03-09 DIAGNOSIS — H401112 Primary open-angle glaucoma, right eye, moderate stage: Secondary | ICD-10-CM | POA: Diagnosis not present

## 2021-03-09 DIAGNOSIS — Z961 Presence of intraocular lens: Secondary | ICD-10-CM | POA: Diagnosis not present

## 2021-03-09 DIAGNOSIS — H401122 Primary open-angle glaucoma, left eye, moderate stage: Secondary | ICD-10-CM | POA: Diagnosis not present

## 2021-03-09 DIAGNOSIS — H52203 Unspecified astigmatism, bilateral: Secondary | ICD-10-CM | POA: Diagnosis not present

## 2021-03-11 NOTE — Progress Notes (Deleted)
Primary Physician/Referring:  [name], MD  Patient ID: [name], female    DOB: Apr 11, 1933, 87 y.o.   MRN: [id number]  No chief complaint on file.  HPI:    JALEE SAINE  is a 87 y.o.  female  with past medical history of obesity, chronic diastolic CHF, hypertensive heart disease, paroxysmal atrial fibrillation, anemia, hemorrhoids, diverticulosis, esophageal reflux, depression, OSA intolerant to CPAP on nocturnal 02, rheumatoid arthritis, and venous insufficiency. CT scan of the chest 03/2016 without definitive ILD. LM and 2 vessel CAD, mild calcification on mitral and aortic valve. S/p AAA repair on 12/20/2019.  Her last episode of atrial fibrillation was in August 2021.    ***  Patient presents For visit with concerns of worsening bilateral lower extremity swelling.  She called the office 02/09/2021 with shortness of breath, lower extremity swelling, and cough following recent flu infection.  At that time patient was advised to increase Lasix dosing to 20 mg twice daily.  She now presents for 1 week follow-up. Patient is presently taking Lasix 40 mg once daily for the last several days with minimal improvement of leg edema. She also continues to have shortness of breath, cough, and congestion.  Patient continues to follow with vascular surgery for regular follow-up.  Past Medical History:  Diagnosis Date   Anemia    h/o hemorrhoidal bleeding and blood transfusion   Cardiomegaly    Chronic diastolic CHF (congestive heart failure) (HCC)    Depression    Diverticulosis    DOE (dyspnea on exertion)    Esophageal reflux    GERD (gastroesophageal reflux disease)    Hypothyroidism    Insomnia    LBP (low back pain)    OAB (overactive bladder)    Obesity    OSA (obstructive sleep apnea)    Osteoarthritis    Rheumatoid arthritis(714.0)    Shoulder pain, bilateral    Unspecified essential hypertension    Venous insufficiency    Past Surgical History:   Procedure Laterality Date   ABDOMINAL AORTIC ENDOVASCULAR STENT GRAFT N/A 12/20/2019   Procedure: Aortogram including catheter selection of aorta and bilateral iliac arteriogram, Endovascular repair of infrarenal abdominal aortic aneurysm with bifurcated stent graft (26 mm x 14 x 12 main body, right bell bottom with a 20 mm x 10 cm piece, and left bell bottom with a 16 mm x 12 cm piece) ;  Surgeon: Marty Heck, MD;  Location: [place];  Service: Vascular;  Laterality: N/A;   APPENDECTOMY  1953   bladder abduction-1996  1996   breast biopsy Right 1980   CARDIOVERSION N/A 12/24/2019   Procedure: CARDIOVERSION;  Surgeon: Adrian Prows, MD;  Location: [facility] ENDOSCOPY;  Service: Cardiovascular;  Laterality: N/A;   [place] N/A 04/10/2015   Procedure: FLEXIBLE SIGMOIDOSCOPY;  Surgeon: Arta Silence, MD;  Location: [facility] ENDOSCOPY;  Service: Endoscopy;  Laterality: N/A;   knee arthroscopy Right 1996, 2010   KNEE ARTHROSCOPY W/ AUTOGENOUS CARTILAGE IMPLANTATION (ACI) PROCEDURE Left 1994, 1995   REFRACTIVE SURGERY  01/2020   SHOULDER SURGERY  1990   TEE WITHOUT CARDIOVERSION N/A 12/24/2019   Procedure: TRANSESOPHAGEAL ECHOCARDIOGRAM (TEE);  Surgeon: Adrian Prows, MD;  Location: [place];  Service: Cardiovascular;  Laterality: N/A;   [place] Bilateral 12/20/2019   Procedure: Ultrasound-guided access of  bilateral common femoral arteries for delivery of endograft and percutaneous closure;  Surgeon: Marty Heck, MD;  Location: [facility] OR;  Service: Vascular;  Laterality: Bilateral;   VESICOVAGINAL FISTULA CLOSURE W/ TAH     WRIST SURGERY  1967   Family History  Problem Relation Age of Onset   COPD Father    Lung cancer Mother    Heart attack Maternal Grandmother 61   Breast cancer Maternal Aunt    Lung cancer Son     Social  History   Tobacco Use   Smoking status: Former    Packs/day: 0.20    Years: 10.00    Pack years: 2.00    Types: Cigarettes    Quit date: 04/11/1984    Years since quitting: 36.9   Smokeless tobacco: Never  Substance Use Topics   Alcohol use: Yes    Alcohol/week: 0.0 standard drinks    Comment: cocktail once a week   Marital Status: Widowed  ROS  Review of Systems  Cardiovascular:  Positive for dyspnea on exertion (stable) and leg swelling. Negative for chest pain, orthopnea, palpitations, paroxysmal nocturnal dyspnea and syncope.  Respiratory:  Positive for cough. Negative for hemoptysis.   Genitourinary:  Positive for bladder incontinence and urgency.  Objective  There were no vitals taken for this visit.  Vitals with BMI 02/16/2021 11/16/2020 11/10/2020  Height 5' 3"  5' 3"  5' 3"   Weight 211 lbs 206 lbs 10 oz 205 lbs  BMI 37.39 57.26 20.35  Systolic 597 416 384  Diastolic 49 50 85  Pulse 61 67 60     Physical Exam Vitals reviewed.  Constitutional:      General: She is not in acute distress.    Appearance: She is well-developed.  Cardiovascular:     Rate and Rhythm: Normal rate. Rhythm regularly irregular.     Pulses: Intact distal pulses.          Carotid pulses are 2+ on the right side and 2+ on the left side.      Radial pulses are 2+ on the right side and 2+ on the left side.     Heart sounds: S1 normal and S2 normal. Murmur heard.  Harsh midsystolic murmur is present with a grade of 3/6 at the upper right sternal border radiating to the apex.    No gallop.     Comments: No JVD.  Pulmonary:     Effort: Pulmonary effort is normal. No accessory muscle usage or respiratory distress.     Breath sounds: Rales (bilateral bases) present. No wheezing or rhonchi.  Musculoskeletal:        General: Tenderness present.     Right lower leg: Edema (1+ pitting) present.     Left lower leg: Edema (1+ pitting) present.     Comments: Limited range of motion, bilateral shoulders   Neurological:     Mental Status: She is alert.   Laboratory examination:   Recent Labs    04/30/20 1200 06/08/20 1607 08/11/20 1525 10/28/20 1619 11/12/20 1555  NA 137   < > 139 140 140  K 4.4   < > 4.9 4.8 4.8  CL 96   < > 99 96 100  CO2 27   < > 24 25 27   GLUCOSE 91   < > 88 93 93  BUN 56*   < > 25 38* 43*  CREATININE 1.35*   < > 1.15* 1.20* 1.53*  CALCIUM 9.0   < > 9.0 9.1 8.8  GFRNONAA 36*  --   --   --   --   GFRAA 41*  --   --   --   --    < > = values in this interval not displayed.    CrCl cannot be calculated (Patient's most recent lab result is older than the maximum 21 days allowed.).  CMP Latest Ref Rng & Units 11/12/2020 10/28/2020 08/11/2020  Glucose 65 - 99 mg/dL 93 93 88  BUN 8 - 27 mg/dL 43(H) 38(H) 25  Creatinine 0.57 - 1.00 mg/dL 1.53(H) 1.20(H) 1.15(H)  Sodium 134 - 144 mmol/L 140 140 139  Potassium 3.5 - 5.2 mmol/L 4.8 4.8 4.9  Chloride 96 - 106 mmol/L 100 96 99  CO2 20 - 29 mmol/L 27 25 24   Calcium 8.7 - 10.3 mg/dL 8.8 9.1 9.0  Total Protein 6.5 - 8.1 g/dL - - -  Total Bilirubin 0.3 - 1.2 mg/dL - - -  Alkaline Phos 38 - 126 U/L - - -  AST 15 - 41 U/L - - -  ALT 0 - 44 U/L - - -   CBC Latest Ref Rng & Units 08/04/2020 12/25/2019 12/24/2019  WBC 3.4 - 10.8 x10E3/uL 6.6 8.8 10.5  Hemoglobin 11.1 - 15.9 g/dL 11.8 9.1(L) 9.1(L)  Hematocrit 34.0 - 46.6 % 36.2 30.7(L) 30.0(L)  Platelets 150 - 450 x10E3/uL 393 309 317   Lipid Panel    Component Value Date/Time   CHOL 126 03/14/2019 1121   TRIG 82 03/14/2019 1121   HDL 49 03/14/2019 1121   CHOLHDL 5.1 CALC 12/19/2006 0916   VLDL 26 12/19/2006 0916   LDLCALC 61 03/14/2019 1121   LDLDIRECT 159.2 12/19/2006 0916   HEMOGLOBIN A1C No results found for: HGBA1C, MPG TSH No results for input(s): TSH in the last 8760 hours.  External labs:   07/20/2020: Glucose 84, BUN 30, creatinine 1.22, GFR 48, sodium 137, potassium 4.9,  BNP 321  Cholesterol, total 164.000 m 06/21/2019 HDL 46 MG/DL 06/21/2019 LDL  99.000 mg 06/21/2019 Triglycerides 93.000 06/21/2019  04/14/2020: Serum glucose 86 mg, BUN 22, creatinine 1.13, EGFR 44 mL, sodium 138, potassium 4.3.  CMP otherwise normal. Hb 10.2/HCT 31.5, platelets 383.  02/17/2020:  Hemoglobin 11.1, hematocrit 34.2, platelets 383 BUN 26, creatinine 0.9, EGFR 59, sodium 136, potassium 4.6  01/07/2020: BUN 29, GFR 57, creatinine 0.93, BUN/CR 31, sodium 133, potassium 4.6, chloride 93, BMP otherwise normal  Cholesterol, total 164.000 m 06/21/2019 Triglycerides 93.000 06/21/2019 HDL 46 MG/DL 06/21/2019 LDL 99.000 mg 06/21/2019  Glucose Random 87.000 mg 06/21/2019 BUN 25.000 mg 06/21/2019 Creatinine, Serum 0.800 mg/ 06/21/2019  TSH 3.220 micr 06/21/2019  AST 18, ALT 11 09/14/2018  FOBT: Normal 04/24/2018  MicroAlbumin Urine 13.000 03/23/2017 MicroAlbumin/Creat 37.9 MG/ 03/23/2017 Allergies   Allergies  Allergen Reactions   Statins Other (See Comments)    Muscle aches and INTERNAL BLEEDING   Gabapentin Other (See Comments)    "Made me loopy"   Methocarbamol Other (See Comments)    "Made me loopy"    Medications Prior to Visit:   Outpatient Medications Prior to Visit  Medication Sig Dispense Refill   amiodarone (PACERONE) 100 MG tablet TAKE ONE TABLET BY MOUTH ONCE DAILY 90 tablet 2   amLODipine (NORVASC) 10 MG tablet TAKE ONE TABLET BY MOUTH AT NOON 90 tablet 1   apixaban (ELIQUIS) 2.5 MG TABS tablet Take 1 tablet (2.5 mg total) by mouth 2 (two) times daily. 180 tablet 3   Bempedoic Acid-Ezetimibe (NEXLIZET) 180-10 MG TABS Take  1 capsule by mouth daily. (Patient taking differently: Take 1 capsule by mouth daily. Taking it at bedtime) 30 tablet 3   carvedilol (COREG) 12.5 MG tablet TAKE ONE TABLET BY MOUTH AT BREAKFAST AND AT BEDTIME 180 tablet 2   Cholecalciferol (VITAMIN D3) 125 MCG (5000 UT) CAPS Take 5,000 Units by mouth daily with breakfast. Taking it at lunchtime     diclofenac sodium (VOLTAREN) 1 % GEL Apply 2.25 g topically 3 (three)  times daily as needed (for pain).      furosemide (LASIX) 20 MG tablet Take 1 tablet (20 mg total) by mouth daily as needed for fluid or edema. Take Lasix 20 mg once daily with an additional dose as needed for fluid overload. 90 tablet 3   HYDROcodone-acetaminophen (NORCO) 7.5-325 MG tablet Take 1 tablet by mouth every 6 (six) hours as needed for moderate pain. 20 tablet 0   isosorbide-hydrALAZINE (BIDIL) 20-37.5 MG tablet Take 1 tablet by mouth 3 (three) times daily.     latanoprost (XALATAN) 0.005 % ophthalmic solution Place 1 drop into both eyes at bedtime.     levothyroxine (SYNTHROID) 125 MCG tablet Take 125 mcg by mouth daily before breakfast.      losartan (COZAAR) 100 MG tablet Take 1 tablet (100 mg total) by mouth daily. 180 tablet 1   Multiple Vitamins-Minerals (ICAPS AREDS 2 PO) Take by mouth.     omeprazole (PRILOSEC) 20 MG capsule Take 1 capsule (20 mg total) by mouth daily. 30 capsule 11   rOPINIRole (REQUIP) 1 MG tablet Take 1 mg by mouth See admin instructions. Take 1 mg by mouth once a day between 3 PM-4 PM and an additional 1 mg in the evening, if no relief  12   spironolactone (ALDACTONE) 25 MG tablet TAKE 1/2 TABLET BY MOUTH ONCE DAILY 15 tablet 3   No facility-administered medications prior to visit.   Final Medications at End of Visit    No outpatient medications have been marked as taking for the 03/12/21 encounter (Appointment) with Rayetta Pigg, Regie Bunner C, PA-C.    Radiology:   CT Abdomen and Pelvis W Contrast 12/20/2019:  1. Large Infrarenal Abdominal Aortic Aneurysm measuring up to 5.7 cm with mild perianeurysmal inflammation. Differential considerations include impending AAA rupture, mycotic AAA, symptomatic AAA. Recommend urgent transfer to emergency department and Vascular Surgery consultation. Aortic aneurysm NOS (ICD10-I71.9). Aortic Atherosclerosis (ICD10-I70.0). 2. No other acute or inflammatory process identified. 5 cm fat containing left pelvic hernia. Large  bowel diverticulosis. 3. Simple appearing 4 cm left ovarian cyst, recommend follow-up Ultrasound in 6-12 months  Chest X-ray 12/20/2019:   Diffuse cardiac enlargement. Emphysematous changes in the lungs. Coarse interstitial pattern to the lungs could represent fibrosis or edema. No focal consolidation. No pleural effusions. No pneumothorax. Calcified and tortuous aorta. Degenerative changes in the shoulders and spine. Old rib fractures. Similar appearance to previous study. IMPRESSION: 1. Cardiac enlargement. 2. Coarse interstitial pattern to the lungs could represent fibrosis or edema.  Abdominal X-Ray 12/20/2019:  Interval placement of aorto iliac stent graft. Nonobstructive bowel gas pattern.  Cardiac Studies:   Lexiscan myoview stress test 12/08/2017: 1. Lexiscan stress test was performed. Exercise capacity was not assessed. Stress symptoms included abdominal pain. Blood pressure was normal. The resting and stress electrocardiogram demonstrated normal sinus rhythm, normal resting conduction, no resting arrhythmias and normal rest repolarization. 2. The overall quality of the study is good. There is no evidence of abnormal lung activity. Stress and rest SPECT images demonstrate homogeneous tracer distribution  throughout the myocardium. Gated SPECT imaging reveals normal myocardial thickening and wall motion. The left ventricular ejection fraction was normal (54%). 3. Low risk study  Renal artery duplex 09/27/2017: Hemodynamically significant stenosis bilaterally. Renal length is within normal limits for both kidneys. There is an incidental 3.9x3.4x3.4 cm proximal and mid abdominal aortic aneurysm. Recommend dedicated abdominal aortic duplex scan to further define AAA.  Abdominal Aortic Duplex  01/01/2019: Moderate dilatation of the abdominal aorta is noted in the proximal, mid and distal aorta. An abdominal aortic aneurysm measuring 4.5 x 4.52 x 4.52 cm is seen.  Recheck in 6 months  for stability of the aneurysm.  TEE 12/24/2019:  1. Left ventricular ejection fraction, by estimation, is 50 to 55%. The left ventricle has low normal function. Left ventricular diastolic function could not be evaluated.   2. Right ventricular systolic function is normal. The right ventricular size is normal.   3. Left atrial size was moderately dilated. No left atrial/left atrial appendage thrombus was detected. The LAA emptying velocity was 30 cm/s.   4. The mitral valve is normal in structure. Mild mitral valve regurgitation. No evidence of mitral stenosis.   5. The aortic valve is calcified. Aortic valve regurgitation is not visualized. Mild aortic valve stenosis. Aortic valve mean gradient measures 10.8 mmHg.   6. There is Moderate (Grade III) layered plaque involving the ascending, transverse and descending aorta.   Echocardiogram 05/27/2020: Left ventricle cavity is normal in size. Moderate concentric hypertrophy of the left ventricle. Normal global wall motion. Normal LV systolic function with EF 56%. Doppler evidence of grade II (pseudonormal) diastolic dysfunction, elevated LAP.  Left atrial cavity is mildly dilated. Trileaflet aortic valve with mild aortic valve leaflet calcification. Mild aortic valve stenosis. Vmax 2.6 m/sec, mean PG 16 mmHg, AVA 1.2 cm2 by continuity equation Mild mitral valve leaflet calcification. Mild (Grade I) mitral regurgitation. Mild tricuspid regurgitation. Estimated pulmonary artery systolic pressure 31 mmHg.  Compared to previous study on 01/25/2019, trivial change in aortic valve mean PG from 12 to 16 mmHg, PASP from 25 mmHg to 31 mmHg.   EKG  02/16/2021: Sinus rhythm with frequent PVCs in a trigeminal pattern and a rate of 90 bpm.  Normal axis.  Poor R wave progression, cannot exclude anteroseptal infarct old.  05/31/2020: Sinus rhythm with borderline first-degree AV block at rate of 61 bpm, left atrial enlargement, normal axis.  Poor R wave progression,  probably normal variant but cannot exclude anteroseptal infarct old.  No evidence of ischemia, normal QT interval. No significant change from 01/02/2020.  Assessment   No diagnosis found.  This patients CHA2DS2-VASc Score 6 (CHF, HTN, vasc, age, F) and yearly risk of stroke 9.8%.  No orders of the defined types were placed in this encounter.  There are no discontinued medications.  Recommendations:   GWENDLYON ZUMBRO  is a 87 y.o.  female  with past medical history of obesity, chronic diastolic CHF, hypertensive heart disease, paroxysmal atrial fibrillation, anemia, hemorrhoids, diverticulosis, esophageal reflux, depression, OSA intolerant to CPAP on nocturnal 02, rheumatoid arthritis, and venous insufficiency. CT scan of the chest 03/2016 without definitive ILD. LM and 2 vessel CAD, mild calcification on mitral and aortic valve. S/p AAA repair on 12/20/2019.    Patient presents For visit with concerns of worsening bilateral lower extremity swelling.  She called the office 02/09/2021 with shortness of breath, lower extremity swelling, and cough following recent flu infection.  At that time patient was advised to increase Lasix dosing to  20 mg twice daily.  She now presents for 1 week follow-up.  Patient continues to have bilateral lower leg edema, which appears relatively stable compared to last office visit.  However she does have rales in bilateral lower lung fields on exam.  We will obtain BMP and BNP.  Advised patient continue Lasix 40 mg in the morning and add an additional 20 mg in the afternoon for the next 3 days.  Patient may then return to Lasix 40 mg once daily.  Given recent history of flulike illness and bilateral rales on pulmonary exam today we will obtain chest x-ray.  In regard to atrial fibrillation, will reduce Eliquis to 2.5 mg p.o. twice daily as patient's creatinine is now 1.53 on most recent check.  EKG today does show ventricular trigeminy, which cannot be ruled out may be  contributing to patient's symptoms of fatigue.  We will repeat EKG at next office visit and can consider cardiac monitor to evaluate PVC burden if necessary in the future.  Follow-up in 2 weeks, sooner if needed, for swelling and results of testing.   Alethia Berthold, PA-C 03/11/2021, 4:57 PM Office: [phone]

## 2021-03-12 ENCOUNTER — Ambulatory Visit: Payer: Medicare Other | Admitting: Student

## 2021-03-12 DIAGNOSIS — I10 Essential (primary) hypertension: Secondary | ICD-10-CM

## 2021-03-12 DIAGNOSIS — I5032 Chronic diastolic (congestive) heart failure: Secondary | ICD-10-CM

## 2021-03-19 ENCOUNTER — Telehealth: Payer: Self-pay | Admitting: Pharmacist

## 2021-03-19 NOTE — Telephone Encounter (Signed)
Pt re-enrolled in [place] [employer] [name] for [employer]. Active from 03/21/21-03/20/22.   Pt notes that she missed her follow up OV with [name] last week, wanted to reschedule f/u. Transferred to the front to schedule OV on 03/22/21. Pt notes that she was seen by PCP recently and was told that her CXR was negative for active PNA concerns. Was started on preemptive doxycycline course for 7 days.   Pt states that she continues to have dyspnea and fatigue symptoms. Reports that she hasnt been able to do her do her regular walking activities without being symptomatic. Pt stated that she had been taking her lasix 20 mg daily, but took 40 mg today to see if it helps improve her symptoms. Pt stated that she did gain 2 lbs over the past 2 days. Pt denies significant worsening of edema concerns. Pt agreeable to continue taking lasix 40 mg daily till her upcoming OV and to continue monitoring her symptoms. If symptoms continues to worsen, pt aware to notify the office or go to the ER/UC.

## 2021-03-22 ENCOUNTER — Encounter: Payer: Self-pay | Admitting: Student

## 2021-03-22 ENCOUNTER — Ambulatory Visit: Payer: Medicare Other | Admitting: Student

## 2021-03-22 ENCOUNTER — Inpatient Hospital Stay: Payer: Medicare Other

## 2021-03-22 ENCOUNTER — Other Ambulatory Visit: Payer: Self-pay

## 2021-03-22 VITALS — BP 122/56 | HR 63 | Temp 97.5°F | Ht 63.0 in | Wt 212.0 lb

## 2021-03-22 DIAGNOSIS — I493 Ventricular premature depolarization: Secondary | ICD-10-CM

## 2021-03-22 DIAGNOSIS — R0609 Other forms of dyspnea: Secondary | ICD-10-CM

## 2021-03-22 DIAGNOSIS — I5032 Chronic diastolic (congestive) heart failure: Secondary | ICD-10-CM | POA: Diagnosis not present

## 2021-03-22 DIAGNOSIS — I480 Paroxysmal atrial fibrillation: Secondary | ICD-10-CM

## 2021-03-22 NOTE — Progress Notes (Signed)
Primary Physician/Referring:  [name], MD  [name], female    DOB: 04-02-1933, 87 y.o.   MRN: [id number]  Chief Complaint  Patient presents with   Atrial Fibrillation   Follow-up   Hypertension   HPI:    Lisa Mcclure  is a 87 y.o.  female  with past medical history of obesity, chronic diastolic CHF, hypertensive heart disease, paroxysmal atrial fibrillation, anemia, hemorrhoids, diverticulosis, esophageal reflux, depression, OSA intolerant to CPAP on nocturnal 02, rheumatoid arthritis, and venous insufficiency. CT scan of the chest 03/2016 without definitive ILD. LM and 2 vessel CAD, mild calcification on mitral and aortic valve. S/p AAA repair on 12/20/2019.  Her last episode of atrial fibrillation was in August 2021.    Patient presents for 2-week follow-up.  Bilateral leg edema has improved since last office visit and patient is continue to take Lasix 40 mg once daily with additional doses as needed.  Patient's cough and congestion have resolved, however she continues to complain of dyspnea on exertion.  PCP did chest x-ray and BMP.  Chest x-ray was unremarkable and BMP stable.  Denies chest pain, palpitations, syncope, near syncope.  Denies orthopnea, PND.  Patient continues to follow with vascular surgery for regular follow-up.  Past Medical History:  Diagnosis Date   Anemia    h/o hemorrhoidal bleeding and blood transfusion   Cardiomegaly    Chronic diastolic CHF (congestive heart failure) (HCC)    Depression    Diverticulosis    DOE (dyspnea on exertion)    Esophageal reflux    GERD (gastroesophageal reflux disease)    Hypothyroidism    Insomnia    LBP (low back pain)    OAB (overactive bladder)    Obesity    OSA (obstructive sleep apnea)    Osteoarthritis    Rheumatoid arthritis(714.0)    Shoulder pain, bilateral    Unspecified essential hypertension    Venous insufficiency    Past Surgical History:  Procedure Laterality Date    ABDOMINAL AORTIC ENDOVASCULAR STENT GRAFT N/A 12/20/2019   Procedure: Aortogram including catheter selection of aorta and bilateral iliac arteriogram, Endovascular repair of infrarenal abdominal aortic aneurysm with bifurcated stent graft (26 mm x 14 x 12 main body, right bell bottom with a 20 mm x 10 cm piece, and left bell bottom with a 16 mm x 12 cm piece) ;  Surgeon: Marty Heck, MD;  Location: [place];  Service: Vascular;  Laterality: N/A;   APPENDECTOMY  1953   bladder abduction-1996  1996   breast biopsy Right 1980   CARDIOVERSION N/A 12/24/2019   Procedure: CARDIOVERSION;  Surgeon: Adrian Prows, MD;  Location[facility] ENDOSCOPY;  Service: Cardiovascular;  Laterality: N/A;   [place] N/A 04/10/2015   Procedure: FLEXIBLE SIGMOIDOSCOPY;  Surgeon: Arta Silence, MD;  Location[facility] ENDOSCOPY;  Service: Endoscopy;  Laterality: N/A;   knee arthroscopy Right 1996, 2010   KNEE ARTHROSCOPY W/ AUTOGENOUS CARTILAGE IMPLANTATION (ACI) PROCEDURE Left 1994, 1995   REFRACTIVE SURGERY  01/2020   SHOULDER SURGERY  1990   TEE WITHOUT CARDIOVERSION N/A 12/24/2019   Procedure: TRANSESOPHAGEAL ECHOCARDIOGRAM (TEE);  Surgeon: Adrian Prows, MD;  Location: [place];  Service: Cardiovascular;  Laterality: N/A;   [place] Bilateral 12/20/2019   Procedure: Ultrasound-guided access of bilateral common  femoral arteries for delivery of endograft and percutaneous closure;  Surgeon: Marty Heck, MD;  Location: [facility] OR;  Service: Vascular;  Laterality: Bilateral;   VESICOVAGINAL FISTULA CLOSURE W/ TAH     WRIST SURGERY  1967   Family History  Problem Relation Age of Onset   COPD Father    Lung cancer Mother    Heart attack Maternal Grandmother 70   Breast cancer Maternal Aunt    Lung cancer Son     Social History   Tobacco Use    Smoking status: Former    Packs/day: 0.20    Years: 10.00    Pack years: 2.00    Types: Cigarettes    Quit date: 04/11/1984    Years since quitting: 36.9   Smokeless tobacco: Never  Substance Use Topics   Alcohol use: Yes    Alcohol/week: 0.0 standard drinks    Comment: cocktail once a week   Marital Status: Widowed  ROS  Review of Systems  Cardiovascular:  Positive for dyspnea on exertion (worse than baseline) and leg swelling (improved). Negative for chest pain, orthopnea, palpitations, paroxysmal nocturnal dyspnea and syncope.  Respiratory:  Negative for cough and hemoptysis.   Genitourinary:  Positive for bladder incontinence and urgency.  Objective  Blood pressure (!) 122/56, pulse 63, temperature (!) 97.5 F (36.4 C), height _0  (1.6 m), weight 212 lb (96.2 kg), SpO2 97 %.  Vitals with BMI 03/22/2021 02/16/2021 11/16/2020  Height _1  _2  _3   Weight 212 lbs 211 lbs 206 lbs 10 oz  BMI 37.56 89.16 94.50  Systolic 388 828 003  Diastolic 56 49 50  Pulse 63 61 67     Physical Exam Vitals reviewed.  Constitutional:      General: She is not in acute distress.    Appearance: She is well-developed.  Cardiovascular:     Rate and Rhythm: Normal rate. Rhythm irregular. FrequentExtrasystoles are present.    Pulses: Intact distal pulses.          Carotid pulses are 2+ on the right side and 2+ on the left side.      Radial pulses are 2+ on the right side and 2+ on the left side.     Heart sounds: S1 normal and S2 normal. Murmur heard.  Harsh midsystolic murmur is present with a grade of 3/6 at the upper right sternal border radiating to the apex.    No gallop.     Comments: No JVD.  Pulmonary:     Effort: Pulmonary effort is normal. No accessory muscle usage or respiratory distress.     Breath sounds: Rales (bilateral bases) present. No wheezing or rhonchi.  Musculoskeletal:        General: Tenderness present.     Right lower leg: Edema (minimal) present.     Left lower  leg: Edema (minimal) present.  Neurological:     Mental Status: She is alert.   Laboratory examination:   Recent Labs    04/30/20 1200 06/08/20 1607 08/11/20 1525 10/28/20 1619 11/12/20 1555  NA 137   < > 139 140 140  K 4.4   < > 4.9 4.8 4.8  CL 96   < > 99 96 100  CO2 27   < > _4 GLUCOSE 91   < > 88 93 93  BUN 56*   < > 25 38* 43*  CREATININE 1.35*   < > 1.15* 1.20* 1.53*  CALCIUM 9.0   < >  9.0 9.1 8.8  GFRNONAA 36*  --   --   --   --   GFRAA 41*  --   --   --   --    < > = values in this interval not displayed.   CrCl cannot be calculated (Patient's most recent lab result is older than the maximum 21 days allowed.).  CMP Latest Ref Rng & Units 11/12/2020 10/28/2020 08/11/2020  Glucose 65 - 99 mg/dL 93 93 88  BUN 8 - 27 mg/dL 43(H) 38(H) 25  Creatinine 0.57 - 1.00 mg/dL 1.53(H) 1.20(H) 1.15(H)  Sodium 134 - 144 mmol/L 140 140 139  Potassium 3.5 - 5.2 mmol/L 4.8 4.8 4.9  Chloride 96 - 106 mmol/L 100 96 99  CO2 20 - 29 mmol/L _0 Calcium 8.7 - 10.3 mg/dL 8.8 9.1 9.0  Total Protein 6.5 - 8.1 g/dL - - -  Total Bilirubin 0.3 - 1.2 mg/dL - - -  Alkaline Phos 38 - 126 U/L - - -  AST 15 - 41 U/L - - -  ALT 0 - 44 U/L - - -   CBC Latest Ref Rng & Units 08/04/2020 12/25/2019 12/24/2019  WBC 3.4 - 10.8 x10E3/uL 6.6 8.8 10.5  Hemoglobin 11.1 - 15.9 g/dL 11.8 9.1(L) 9.1(L)  Hematocrit 34.0 - 46.6 % 36.2 30.7(L) 30.0(L)  Platelets 150 - 450 x10E3/uL 393 309 317   Lipid Panel    Component Value Date/Time   CHOL 126 03/14/2019 1121   TRIG 82 03/14/2019 1121   HDL 49 03/14/2019 1121   CHOLHDL 5.1 CALC 12/19/2006 0916   VLDL 26 12/19/2006 0916   LDLCALC 61 03/14/2019 1121   LDLDIRECT 159.2 12/19/2006 0916   HEMOGLOBIN A1C No results found for: HGBA1C, MPG TSH No results for input(s): TSH in the last 8760 hours.  External labs:  02/22/2021: BUN 36, creatinine 1.3, GFR 39, sodium 130, potassium 5.4 Hgb 11.3, HCT 35.1, platelet 336 Magnesium  2.4  07/20/2020: Glucose 84, BUN 30, creatinine 1.22, GFR 48, sodium 137, potassium 4.9,  BNP 321  Cholesterol, total 164.000 m 06/21/2019 HDL 46 MG/DL 06/21/2019 LDL 99.000 mg 06/21/2019 Triglycerides 93.000 06/21/2019  04/14/2020: Serum glucose 86 mg, BUN 22, creatinine 1.13, EGFR 44 mL, sodium 138, potassium 4.3.  CMP otherwise normal. Hb 10.2/HCT 31.5, platelets 383.  02/17/2020:  Hemoglobin 11.1, hematocrit 34.2, platelets 383 BUN 26, creatinine 0.9, EGFR 59, sodium 136, potassium 4.6  01/07/2020: BUN 29, GFR 57, creatinine 0.93, BUN/CR 31, sodium 133, potassium 4.6, chloride 93, BMP otherwise normal  Cholesterol, total 164.000 m 06/21/2019 Triglycerides 93.000 06/21/2019 HDL 46 MG/DL 06/21/2019 LDL 99.000 mg 06/21/2019  Glucose Random 87.000 mg 06/21/2019 BUN 25.000 mg 06/21/2019 Creatinine, Serum 0.800 mg/ 06/21/2019  TSH 3.220 micr 06/21/2019  AST 18, ALT 11 09/14/2018  FOBT: Normal 04/24/2018  MicroAlbumin Urine 13.000 03/23/2017 MicroAlbumin/Creat 37.9 MG/ 03/23/2017  Allergies   Allergies  Allergen Reactions   Statins Other (See Comments)    Muscle aches and INTERNAL BLEEDING   Gabapentin Other (See Comments)    "Made me loopy"   Methocarbamol Other (See Comments)    "Made me loopy"    Medications Prior to Visit:   Outpatient Medications Prior to Visit  Medication Sig Dispense Refill   amiodarone (PACERONE) 100 MG tablet TAKE ONE TABLET BY MOUTH ONCE DAILY 90 tablet 2   amLODipine (NORVASC) 10 MG tablet TAKE ONE TABLET BY MOUTH AT NOON 90 tablet 1   apixaban (ELIQUIS) 2.5 MG TABS tablet Take  1 tablet (2.5 mg total) by mouth 2 (two) times daily. 180 tablet 3   Bempedoic Acid-Ezetimibe (NEXLIZET) 180-10 MG TABS Take 1 capsule by mouth daily. (Patient taking differently: Take 1 capsule by mouth daily. Taking it at bedtime) 30 tablet 3   carvedilol (COREG) 12.5 MG tablet TAKE ONE TABLET BY MOUTH AT BREAKFAST AND AT BEDTIME 180 tablet 2   Cholecalciferol (VITAMIN  D3) 125 MCG (5000 UT) CAPS Take 5,000 Units by mouth daily with breakfast. Taking it at lunchtime     diclofenac sodium (VOLTAREN) 1 % GEL Apply 2.25 g topically 3 (three) times daily as needed (for pain).      furosemide (LASIX) 20 MG tablet Take 1 tablet (20 mg total) by mouth daily as needed for fluid or edema. Take Lasix 20 mg once daily with an additional dose as needed for fluid overload. 90 tablet 3   HYDROcodone-acetaminophen (NORCO) 7.5-325 MG tablet Take 1 tablet by mouth every 6 (six) hours as needed for moderate pain. 20 tablet 0   isosorbide-hydrALAZINE (BIDIL) 20-37.5 MG tablet Take 1 tablet by mouth 3 (three) times daily.     latanoprost (XALATAN) 0.005 % ophthalmic solution Place 1 drop into both eyes at bedtime.     levothyroxine (SYNTHROID) 125 MCG tablet Take 125 mcg by mouth daily before breakfast.      losartan (COZAAR) 100 MG tablet Take 1 tablet (100 mg total) by mouth daily. 180 tablet 1   Multiple Vitamins-Minerals (ICAPS AREDS 2 PO) Take by mouth.     omeprazole (PRILOSEC) 20 MG capsule Take 1 capsule (20 mg total) by mouth daily. 30 capsule 11   rOPINIRole (REQUIP) 1 MG tablet Take 1 mg by mouth See admin instructions. Take 1 mg by mouth once a day between 3 PM-4 PM and an additional 1 mg in the evening, if no relief  12   spironolactone (ALDACTONE) 25 MG tablet TAKE 1/2 TABLET BY MOUTH ONCE DAILY 15 tablet 3   No facility-administered medications prior to visit.   Final Medications at End of Visit    Current Meds  Medication Sig   amiodarone (PACERONE) 100 MG tablet TAKE ONE TABLET BY MOUTH ONCE DAILY   amLODipine (NORVASC) 10 MG tablet TAKE ONE TABLET BY MOUTH AT NOON   apixaban (ELIQUIS) 2.5 MG TABS tablet Take 1 tablet (2.5 mg total) by mouth 2 (two) times daily.   Bempedoic Acid-Ezetimibe (NEXLIZET) 180-10 MG TABS Take 1 capsule by mouth daily. (Patient taking differently: Take 1 capsule by mouth daily. Taking it at bedtime)   carvedilol (COREG) 12.5 MG tablet  TAKE ONE TABLET BY MOUTH AT BREAKFAST AND AT BEDTIME   Cholecalciferol (VITAMIN D3) 125 MCG (5000 UT) CAPS Take 5,000 Units by mouth daily with breakfast. Taking it at lunchtime   diclofenac sodium (VOLTAREN) 1 % GEL Apply 2.25 g topically 3 (three) times daily as needed (for pain).    furosemide (LASIX) 20 MG tablet Take 1 tablet (20 mg total) by mouth daily as needed for fluid or edema. Take Lasix 20 mg once daily with an additional dose as needed for fluid overload.   HYDROcodone-acetaminophen (NORCO) 7.5-325 MG tablet Take 1 tablet by mouth every 6 (six) hours as needed for moderate pain.   isosorbide-hydrALAZINE (BIDIL) 20-37.5 MG tablet Take 1 tablet by mouth 3 (three) times daily.   latanoprost (XALATAN) 0.005 % ophthalmic solution Place 1 drop into both eyes at bedtime.   levothyroxine (SYNTHROID) 125 MCG tablet Take 125 mcg by mouth  daily before breakfast.    losartan (COZAAR) 100 MG tablet Take 1 tablet (100 mg total) by mouth daily.   Multiple Vitamins-Minerals (ICAPS AREDS 2 PO) Take by mouth.   omeprazole (PRILOSEC) 20 MG capsule Take 1 capsule (20 mg total) by mouth daily.   rOPINIRole (REQUIP) 1 MG tablet Take 1 mg by mouth See admin instructions. Take 1 mg by mouth once a day between 3 PM-4 PM and an additional 1 mg in the evening, if no relief   spironolactone (ALDACTONE) 25 MG tablet TAKE 1/2 TABLET BY MOUTH ONCE DAILY    Radiology:   CT Abdomen and Pelvis W Contrast 12/20/2019:  1. Large Infrarenal Abdominal Aortic Aneurysm measuring up to 5.7 cm with mild perianeurysmal inflammation. Differential considerations include impending AAA rupture, mycotic AAA, symptomatic AAA. Recommend urgent transfer to emergency department and Vascular Surgery consultation. Aortic aneurysm NOS (ICD10-I71.9). Aortic Atherosclerosis (ICD10-I70.0). 2. No other acute or inflammatory process identified. 5 cm fat containing left pelvic hernia. Large bowel diverticulosis. 3. Simple appearing 4 cm left  ovarian cyst, recommend follow-up Ultrasound in 6-12 months  Chest X-ray 12/20/2019:   Diffuse cardiac enlargement. Emphysematous changes in the lungs. Coarse interstitial pattern to the lungs could represent fibrosis or edema. No focal consolidation. No pleural effusions. No pneumothorax. Calcified and tortuous aorta. Degenerative changes in the shoulders and spine. Old rib fractures. Similar appearance to previous study. IMPRESSION: 1. Cardiac enlargement. 2. Coarse interstitial pattern to the lungs could represent fibrosis or edema.  Abdominal X-Ray 12/20/2019:  Interval placement of aorto iliac stent graft. Nonobstructive bowel gas pattern.  Cardiac Studies:   Lexiscan myoview stress test 12/08/2017: 1. Lexiscan stress test was performed. Exercise capacity was not assessed. Stress symptoms included abdominal pain. Blood pressure was normal. The resting and stress electrocardiogram demonstrated normal sinus rhythm, normal resting conduction, no resting arrhythmias and normal rest repolarization. 2. The overall quality of the study is good. There is no evidence of abnormal lung activity. Stress and rest SPECT images demonstrate homogeneous tracer distribution throughout the myocardium. Gated SPECT imaging reveals normal myocardial thickening and wall motion. The left ventricular ejection fraction was normal (54%). 3. Low risk study  Renal artery duplex 09/27/2017: Hemodynamically significant stenosis bilaterally. Renal length is within normal limits for both kidneys. There is an incidental 3.9x3.4x3.4 cm proximal and mid abdominal aortic aneurysm. Recommend dedicated abdominal aortic duplex scan to further define AAA.  Abdominal Aortic Duplex  01/01/2019: Moderate dilatation of the abdominal aorta is noted in the proximal, mid and distal aorta. An abdominal aortic aneurysm measuring 4.5 x 4.52 x 4.52 cm is seen.  Recheck in 6 months for stability of the aneurysm.  TEE 12/24/2019:   1. Left ventricular ejection fraction, by estimation, is 50 to 55%. The left ventricle has low normal function. Left ventricular diastolic function could not be evaluated.   2. Right ventricular systolic function is normal. The right ventricular size is normal.   3. Left atrial size was moderately dilated. No left atrial/left atrial appendage thrombus was detected. The LAA emptying velocity was 30 cm/s.   4. The mitral valve is normal in structure. Mild mitral valve regurgitation. No evidence of mitral stenosis.   5. The aortic valve is calcified. Aortic valve regurgitation is not visualized. Mild aortic valve stenosis. Aortic valve mean gradient measures 10.8 mmHg.   6. There is Moderate (Grade III) layered plaque involving the ascending, transverse and descending aorta.   Echocardiogram 05/27/2020: Left ventricle cavity is normal in size. Moderate concentric  hypertrophy of the left ventricle. Normal global wall motion. Normal LV systolic function with EF 56%. Doppler evidence of grade II (pseudonormal) diastolic dysfunction, elevated LAP.  Left atrial cavity is mildly dilated. Trileaflet aortic valve with mild aortic valve leaflet calcification. Mild aortic valve stenosis. Vmax 2.6 m/sec, mean PG 16 mmHg, AVA 1.2 cm2 by continuity equation Mild mitral valve leaflet calcification. Mild (Grade I) mitral regurgitation. Mild tricuspid regurgitation. Estimated pulmonary artery systolic pressure 31 mmHg.  Compared to previous study on 01/25/2019, trivial change in aortic valve mean PG from 12 to 16 mmHg, PASP from 25 mmHg to 31 mmHg.   EKG  03/22/2021: Sinus rhythm with first-degree AV block with frequent PVCs at a rate of 86 bpm.  02/16/2021: Sinus rhythm with frequent PVCs in a trigeminal pattern and a rate of 90 bpm.  Normal axis.  Poor R wave progression, cannot exclude anteroseptal infarct old.  05/31/2020: Sinus rhythm with borderline first-degree AV block at rate of 61 bpm, left atrial  enlargement, normal axis.  Poor R wave progression, probably normal variant but cannot exclude anteroseptal infarct old.  No evidence of ischemia, normal QT interval. No significant change from 01/02/2020.  Assessment     ICD-10-CM   1. Chronic diastolic (congestive) heart failure (HCC)  I50.32 EKG 12-Lead    2. PVC (premature ventricular contraction)  I49.3 LONG TERM MONITOR (3-14 DAYS)    3. Dyspnea on exertion  R06.09      This patients CHA2DS2-VASc Score 6 (CHF, HTN, vasc, age, F) and yearly risk of stroke 9.8%.  No orders of the defined types were placed in this encounter.  There are no discontinued medications.  Recommendations:   Lisa Mcclure  is a 87 y.o.  female  with past medical history of obesity, chronic diastolic CHF, hypertensive heart disease, paroxysmal atrial fibrillation, anemia, hemorrhoids, diverticulosis, esophageal reflux, depression, OSA intolerant to CPAP on nocturnal 02, rheumatoid arthritis, and venous insufficiency. CT scan of the chest 03/2016 without definitive ILD. LM and 2 vessel CAD, mild calcification on mitral and aortic valve. S/p AAA repair on 12/20/2019.    Patient presents for 2-week follow-up.  Her symptoms of congestion and cough have resolved however she continues to have dyspnea on exertion.  On exam her leg edema is improved compared to last office visit and is minimal.  Patient's lungs are clear.  Patient also has back pain which may be contributing to fatigue particularly with exertion.  Advised patient to take Lasix 40 mg twice daily for the next 3 days.  If symptoms fail to improve could consider repeat echocardiogram.  However patient also has frequent PVCs on EKG today, therefore will obtain Zio patch monitor.  Patient's frequent PVCs may be contributing to her dyspnea as well.  Patient continues to tolerate anticoagulation without bleeding diathesis.  Follow-up in 3 months, sooner if needed pending results of cardiac  testing.   Alethia Berthold, PA-C 03/22/2021, 2:49 PM Office: [phone]

## 2021-04-08 DIAGNOSIS — I493 Ventricular premature depolarization: Secondary | ICD-10-CM | POA: Diagnosis not present

## 2021-04-13 DIAGNOSIS — I493 Ventricular premature depolarization: Secondary | ICD-10-CM | POA: Diagnosis not present

## 2021-04-15 DIAGNOSIS — H43813 Vitreous degeneration, bilateral: Secondary | ICD-10-CM | POA: Diagnosis not present

## 2021-04-15 DIAGNOSIS — H353114 Nonexudative age-related macular degeneration, right eye, advanced atrophic with subfoveal involvement: Secondary | ICD-10-CM | POA: Diagnosis not present

## 2021-04-15 DIAGNOSIS — H43393 Other vitreous opacities, bilateral: Secondary | ICD-10-CM | POA: Diagnosis not present

## 2021-04-15 DIAGNOSIS — H353221 Exudative age-related macular degeneration, left eye, with active choroidal neovascularization: Secondary | ICD-10-CM | POA: Diagnosis not present

## 2021-04-16 ENCOUNTER — Telehealth: Payer: Self-pay | Admitting: Pharmacist

## 2021-04-16 MED ORDER — AMIODARONE HCL 100 MG PO TABS: 200.0000 mg | ORAL_TABLET | Freq: Every day | ORAL | 2 refills | Status: DC

## 2021-04-16 NOTE — Telephone Encounter (Signed)
Pt called back. Reviewed increasing amiodarone to 200 mg Qday and pt scheduled for ECHO on 04/20/21. Pt also requesting to see if she could be referred to cardiac rehab to help improve her exercise tolerance and address her dyspnea concerns.

## 2021-04-16 NOTE — Addendum Note (Signed)
Addended by: Lawerance Cruel L on: 04/16/2021 03:50 PM   Modules accepted: Orders

## 2021-04-16 NOTE — Telephone Encounter (Signed)
I left a VM and sent pt message. Increasing amio to 200 daily and getting echo

## 2021-04-20 ENCOUNTER — Other Ambulatory Visit: Payer: Self-pay

## 2021-04-20 ENCOUNTER — Ambulatory Visit: Payer: Medicare Other

## 2021-04-20 DIAGNOSIS — R0609 Other forms of dyspnea: Secondary | ICD-10-CM

## 2021-04-20 DIAGNOSIS — I5032 Chronic diastolic (congestive) heart failure: Secondary | ICD-10-CM

## 2021-04-20 DIAGNOSIS — I493 Ventricular premature depolarization: Secondary | ICD-10-CM | POA: Diagnosis not present

## 2021-04-21 NOTE — Telephone Encounter (Signed)
Will you please let patient know I spoke to cardiac rehab to see if she would qualify and it currently doesn't appear that she will. But the results of the echo we did yesterday may effect that. So we will let her know. Is patient open to pulmonary rehab or physical therapy instead if she qualifies?

## 2021-04-30 NOTE — Progress Notes (Signed)
Called pt daughter to inform her about pt echo pt will call mother and see if they make a sooner appt.

## 2021-05-07 ENCOUNTER — Telehealth: Payer: Self-pay | Admitting: Physical Medicine and Rehabilitation

## 2021-05-07 NOTE — Telephone Encounter (Signed)
Patient would like an appointment with Dr. Ernestina Patches. Her call back number is [phone]

## 2021-05-13 ENCOUNTER — Other Ambulatory Visit: Payer: Self-pay

## 2021-05-13 ENCOUNTER — Ambulatory Visit (INDEPENDENT_AMBULATORY_CARE_PROVIDER_SITE_OTHER): Payer: Medicare Other | Admitting: Physical Medicine and Rehabilitation

## 2021-05-13 ENCOUNTER — Encounter: Payer: Self-pay | Admitting: Physical Medicine and Rehabilitation

## 2021-05-13 VITALS — BP 91/53 | HR 60

## 2021-05-13 DIAGNOSIS — R1031 Right lower quadrant pain: Secondary | ICD-10-CM

## 2021-05-13 DIAGNOSIS — M7918 Myalgia, other site: Secondary | ICD-10-CM | POA: Diagnosis not present

## 2021-05-13 DIAGNOSIS — G8929 Other chronic pain: Secondary | ICD-10-CM | POA: Diagnosis not present

## 2021-05-13 DIAGNOSIS — M25551 Pain in right hip: Secondary | ICD-10-CM | POA: Diagnosis not present

## 2021-05-13 NOTE — Progress Notes (Signed)
Lisa Mcclure - 88 y.o. female MRN [id number]  Date of birth: December 07, 1932  Office Visit Note: Visit Date: 05/13/2021 PCP: [name], MD Referred by: [name], MD  Subjective: Chief Complaint  Patient presents with   Right Hip - Pain   HPI: Lisa Mcclure is a 88 y.o. female who comes in today for evaluation of chronic, worsening and severe right sided groin pain radiating to buttock region. Patient reports pain has been ongoing for several weeks and attributes this worsening pain to use of low seated furniture [place] her [place]. She initially thought that she pulled a groin muscle. Patient reports pain is exacerbated by walking and activity, describes as a constant soreness sensation, currently rates as 10 out of 10. Patient reports some relief of pain with home exercise regimen, hot showers, use of heating pad and medications. Patient does take Norco as needed for moderate/severe pain that is prescribed by her primary care provider Dr. [name]. Patient reports she recently finished short course of Prednisone and states this medication did not help to alleviate pain. Patients pelvis x-ray images from 2020 exhibits advanced osteoarthritis of the right hip and mild-to-moderate osteoarthritis of the left hip. Patient has a history of chronic right shoulder pain and has had multiple right glenohumeral shoulder injections by Dr. Magnus Sinning over the years which do help to alleviate her pain. Patient states she would like to have right hip injection today as she is in extreme pain. Patient denies focal weakness, numbness and tingling. Patient denies recent trauma or falls.   Review of Systems  Musculoskeletal:  Positive for joint pain.  Neurological:  Negative for tingling, sensory change, focal weakness and weakness.  All other systems reviewed and are negative. Otherwise per HPI.  Assessment & Plan: Visit Diagnoses:    ICD-10-CM   1. Pain in right  hip  M25.551 Ambulatory referral to Physical Medicine Rehab    2. Groin pain, chronic, right  R10.31 Ambulatory referral to Physical Medicine Rehab   G89.29     3. Pain in right buttock  M79.18        Plan: Findings:  Chronic, worsening and severe right sided groin pain radiating to buttock region. Patient continues to have pain despite good conservative therapies such as home exercise regimen, hot showers, use of heating pad and medications. Patients clinical presentation and exam are consistent with hip etiology. We feel the next step is to perform diagnostic and hopefully therapeutic right intra-articular hip injection under fluoroscopic guidance. We are unable to perform injection today due to scheduling, however we feel that we can get patient in quickly. Patient encouraged to remain active and to continue home exercises as tolerated. Patient instructed to use rolling walker to assist with ambulation and prevent falls. No red flag symptoms noted upon exam today.    Meds & Orders: No orders of the defined types were placed in this encounter.   Orders Placed This Encounter  Procedures   Ambulatory referral to Physical Medicine Rehab    Follow-up: Return for Right intra-articular hip injection.   Procedures: No procedures performed      Clinical History: LUMBAR SPINE - COMPLETE 4+ VIEW   COMPARISON: Chest x-ray dated 03/05/2017.   FINDINGS: There is an age-indeterminate compression fracture of the T12 and T11 vertebral bodies. There is some mild height loss of L2 vertebral body. Degenerative changes are noted throughout the visualized lumbar spine. These are greatest at the lower lumbar segments  were there is multilevel facet arthrosis and significant multilevel disc height loss. There appears to be a grade 1-2 anterolisthesis of L5 on S1. Diffuse osteopenia is noted.   IMPRESSION: 1. Age-indeterminate height loss of the T11, T12, and L2 vertebral bodies. Correlation with  point tenderness is recommended. If there is clinical concern for an acute fracture this can be further evaluated with cross-sectional imaging. 2. Multilevel degenerative changes throughout the lumbar spine. 3. Grade 1-2 anterolisthesis of L5 on S1. 4. Osteopenia.     Electronically Signed By: Constance Holster M.D. On: 09/14/2018 19:25 ------- EXAM: PELVIS - 1-2 VIEW   COMPARISON:  None.   FINDINGS: There is no acute displaced fracture or dislocation. There is advanced osteoarthritis of the right hip. There is mild-to-moderate osteoarthritis of the left hip. There are degenerative changes of the pubic symphysis. There is osteopenia.   IMPRESSION: 1. No acute displaced fracture or dislocation. 2. Advanced osteoarthritis of the right hip. 3. Mild-to-moderate osteoarthritis of the left hip. 4. Osteopenia which limits detection of nondisplaced fractures.     Electronically Signed   By: Constance Holster M.D.   On: 09/14/2018 19:20   She reports that she quit smoking about 37 years ago. Her smoking use included cigarettes. She has a 2.00 pack-year smoking history. She has never used smokeless tobacco. No results for input(s): HGBA1C, LABURIC in the last 8760 hours.  Objective:  VS:  HT:     WT:    BMI:      BP:(!) 91/53   HR:60bpm   TEMP: ( )   RESP:  Physical Exam Vitals and nursing note reviewed.  HENT:     Head: Normocephalic and atraumatic.     Right Ear: External ear normal.     Left Ear: External ear normal.     Nose: Nose normal.     Mouth/Throat:     Mouth: Mucous membranes are moist.  Eyes:     Extraocular Movements: Extraocular movements intact.  Cardiovascular:     Rate and Rhythm: Normal rate.     Pulses: Normal pulses.  Pulmonary:     Effort: Pulmonary effort is normal.  Abdominal:     General: Abdomen is flat. There is no distension.  Musculoskeletal:        General: Tenderness present.     Cervical back: Normal range of motion.     Comments: Pt is  slow to rise from seated position to standing. Good lumbar range of motion. Strong distal strength without clonus, no pain upon palpation of greater trochanters. Sensation intact bilaterally. Pain noted upon internal/external rotation of right hip. Ambulates with rolling walker, gait slow and unsteady.   Skin:    General: Skin is warm and dry.     Capillary Refill: Capillary refill takes less than 2 seconds.  Neurological:     Mental Status: She is alert and oriented to person, place, and time.     Gait: Gait abnormal.  Psychiatric:        Mood and Affect: Mood normal.        Behavior: Behavior normal.    Ortho Exam  Imaging: XR C-ARM NO REPORT  Result Date: 05/14/2021 Please see Notes tab for imaging impression.   Past Medical/Family/Surgical/Social History: Medications & Allergies reviewed per EMR, new medications updated. Patient Active Problem List   Diagnosis Date Noted   Atrial fibrillation with RVR ([place])    Long term (current) use of anticoagulants    Long term current use of antiarrhythmic  drug    AAA (abdominal aortic aneurysm) without rupture 12/20/2019   AAA (abdominal aortic aneurysm) 12/20/2019   Intolerance of continuous positive airway pressure (CPAP) ventilation 11/26/2019   Severe hypoxemia 08/21/2019   Severe obstructive sleep apnea 07/29/2019   Chronic obstructive pulmonary disease ([place]) 07/29/2019   RLS (restless legs syndrome) 07/29/2019   Dehydration with hyponatremia 09/13/2018   Bradycardia 09/13/2018   Glaucoma 09/13/2018   OA (osteoarthritis) of shoulder 05/16/2018   Incomplete emptying of bladder 05/17/2017   Labial adhesions 05/17/2017   Mixed stress and urge urinary incontinence 05/17/2017   Urethral sphincter deficiency, intrinsic (ISD) 05/17/2017   Aortic stenosis, mild 01/15/2016   Bilateral carotid bruits 05/11/2015   Hyponatremia 04/19/2015   Chronic anemia 04/19/2015   Panic attack 04/19/2015   Constipation 04/19/2015   Hyperkalemia  04/17/2015   Influenza A 04/16/2015   Proximal humerus fracture    Left patella fracture    Benign essential HTN    Esophageal reflux    Chronic heart failure with preserved ejection fraction (HFpEF) ([place])    Hypothyroidism    Lower GI bleed 04/08/2015   Hematochezia    Rectal bleeding 04/03/2015   Patellar fracture 03/06/2015   Left humeral fracture 11/04/2013   Rib fracture 11/04/2013   Hypothyroid 11/04/2013   Hypertension 11/04/2013   Right shoulder pain 11/04/2013   OSA (obstructive sleep apnea) 11/13/2008   BOOP (bronchiolitis obliterans with organizing pneumonia) ([place]) 08/25/2008   Past Medical History:  Diagnosis Date   Anemia    h/o hemorrhoidal bleeding and blood transfusion   Cardiomegaly    Chronic diastolic CHF (congestive heart failure) ([facility])    Depression    Diverticulosis    DOE (dyspnea on exertion)    Esophageal reflux    GERD (gastroesophageal reflux disease)    Hypothyroidism    Insomnia    LBP (low back pain)    OAB (overactive bladder)    Obesity    OSA (obstructive sleep apnea)    Osteoarthritis    Rheumatoid arthritis(714.0)    Shoulder pain, bilateral    Unspecified essential hypertension    Venous insufficiency    Family History  Problem Relation Age of Onset   COPD Father    Lung cancer Mother    Heart attack Maternal Grandmother 82   Breast cancer Maternal Aunt    Lung cancer Son    Past Surgical History:  Procedure Laterality Date   ABDOMINAL AORTIC ENDOVASCULAR STENT GRAFT N/A 12/20/2019   Procedure: Aortogram including catheter selection of aorta and bilateral iliac arteriogram, Endovascular repair of infrarenal abdominal aortic aneurysm with bifurcated stent graft (26 mm x 14 x 12 main body, right bell bottom with a 20 mm x 10 cm piece, and left bell bottom with a 16 mm x 12 cm piece) ;  Surgeon: Marty Heck, MD;  Location: [place];  Service: Vascular;  Laterality: N/A;   APPENDECTOMY  1953   bladder abduction-1996  1996    breast biopsy Right 1980   CARDIOVERSION N/A 12/24/2019   Procedure: CARDIOVERSION;  Surgeon: Adrian Prows, MD;  Location: [facility] ENDOSCOPY;  Service: Cardiovascular;  Laterality: N/A;   [place] N/A 04/10/2015   Procedure: FLEXIBLE SIGMOIDOSCOPY;  Surgeon: Arta Silence, MD;  Location: [facility] ENDOSCOPY;  Service: Endoscopy;  Laterality: N/A;   knee arthroscopy Right 1996, 2010   KNEE ARTHROSCOPY W/  AUTOGENOUS CARTILAGE IMPLANTATION (ACI) PROCEDURE Left 1994, 1995   REFRACTIVE SURGERY  01/2020   SHOULDER SURGERY  1990   TEE WITHOUT CARDIOVERSION N/A 12/24/2019   Procedure: TRANSESOPHAGEAL ECHOCARDIOGRAM (TEE);  Surgeon: Adrian Prows, MD;  Location: [place];  Service: Cardiovascular;  Laterality: N/A;   [place] Bilateral 12/20/2019   Procedure: Ultrasound-guided access of bilateral common femoral arteries for delivery of endograft and percutaneous closure;  Surgeon: Marty Heck, MD;  Location: [facility];  Service: Vascular;  Laterality: Bilateral;   VESICOVAGINAL FISTULA CLOSURE W/ TAH     WRIST SURGERY  1967   Social History   Occupational History   Occupation: Retired  Tobacco Use   Smoking status: Former    Packs/day: 0.20    Years: 10.00    Pack years: 2.00    Types: Cigarettes    Quit date: 04/11/1984    Years since quitting: 37.1   Smokeless tobacco: Never  Vaping Use   Vaping Use: Never used  Substance and Sexual Activity   Alcohol use: Yes    Alcohol/week: 0.0 standard drinks    Comment: cocktail once a week   Drug use: No   Sexual activity: Not on file

## 2021-05-13 NOTE — Progress Notes (Signed)
Pt state right hip, buttock and groin area pain. Pt state walking makes the pain worse. Pt state she takes pain meds to help ease her pain.  Numeric Pain Rating Scale and Functional Assessment Average Pain 10 Pain Right Now 10 My pain is constant, sharp, burning, stabbing, and aching Pain is worse with: walking and some activites Pain improves with: medication and injections   In the last MONTH (on 0-10 scale) has pain interfered with the following?  1. General activity like being  able to carry out your everyday physical activities such as walking, climbing stairs, carrying groceries, or moving a chair?  Rating(7)  2. Relation with others like being able to carry out your usual social activities and roles such as  activities at home, at work and in your community. Rating(8)  3. Enjoyment of life such that you have  been bothered by emotional problems such as feeling anxious, depressed or irritable?  Rating(9)

## 2021-05-14 ENCOUNTER — Encounter: Payer: Self-pay | Admitting: Physical Medicine and Rehabilitation

## 2021-05-14 ENCOUNTER — Ambulatory Visit: Payer: Self-pay

## 2021-05-14 ENCOUNTER — Ambulatory Visit (INDEPENDENT_AMBULATORY_CARE_PROVIDER_SITE_OTHER): Payer: Medicare Other | Admitting: Physical Medicine and Rehabilitation

## 2021-05-14 DIAGNOSIS — M25551 Pain in right hip: Secondary | ICD-10-CM | POA: Diagnosis not present

## 2021-05-14 MED ORDER — TRIAMCINOLONE ACETONIDE 40 MG/ML IJ SUSP
60.0000 mg | INTRAMUSCULAR | Status: AC | PRN
  Administered 2021-05-14: 60 mg via INTRA_ARTICULAR

## 2021-05-14 MED ORDER — BUPIVACAINE HCL 0.25 % IJ SOLN
4.0000 mL | INTRAMUSCULAR | Status: AC | PRN
  Administered 2021-05-14: 4 mL via INTRA_ARTICULAR

## 2021-05-14 NOTE — Progress Notes (Signed)
Lisa Mcclure - 88 y.o. female MRN [id number]  Date of birth: 1933-01-30  Office Visit Note: Visit Date: 05/14/2021 PCP: [name], MD Referred by: [name], MD  Subjective: Chief Complaint  Patient presents with   Right Hip - Pain   HPI:  Lisa Mcclure is a 88 y.o. female who comes in today at the request of Barnet Pall, FNP for planned Right anesthetic hip arthrogram with fluoroscopic guidance.  The patient has failed conservative care including home exercise, medications, time and activity modification.  This injection will be diagnostic and hopefully therapeutic.  Please see requesting physician notes for further details and justification.   ROS Otherwise per HPI.  Assessment & Plan: Visit Diagnoses:    ICD-10-CM   1. Pain in right hip  M25.551 XR C-ARM NO REPORT    Large Joint Inj: R hip joint      Plan: Some relief with injection. Consider PT.  Meds & Orders: No orders of the defined types were placed in this encounter.   Orders Placed This Encounter  Procedures   Large Joint Inj: R hip joint   XR C-ARM NO REPORT    Follow-up: Return if symptoms worsen or fail to improve.   Procedures: Large Joint Inj: R hip joint on 05/14/2021 10:29 AM Indications: diagnostic evaluation and pain Details: 22 G 3.5 in needle, fluoroscopy-guided anterior approach  Arthrogram: No  Medications: 4 mL bupivacaine 0.25 %; 60 mg triamcinolone acetonide 40 MG/ML Outcome: tolerated well, no immediate complications  There was excellent flow of contrast producing a partial arthrogram of the hip. The patient did have relief of symptoms during the anesthetic phase of the injection. Procedure, treatment alternatives, risks and benefits explained, specific risks discussed. Consent was given by the patient. Immediately prior to procedure a time out was called to verify the correct patient, procedure, equipment, support staff and site/side marked as required. Patient  was prepped and draped in the usual sterile fashion.         Clinical History: LUMBAR SPINE - COMPLETE 4+ VIEW   COMPARISON: Chest x-ray dated 03/05/2017.   FINDINGS: There is an age-indeterminate compression fracture of the T12 and T11 vertebral bodies. There is some mild height loss of L2 vertebral body. Degenerative changes are noted throughout the visualized lumbar spine. These are greatest at the lower lumbar segments were there is multilevel facet arthrosis and significant multilevel disc height loss. There appears to be a grade 1-2 anterolisthesis of L5 on S1. Diffuse osteopenia is noted.   IMPRESSION: 1. Age-indeterminate height loss of the T11, T12, and L2 vertebral bodies. Correlation with point tenderness is recommended. If there is clinical concern for an acute fracture this can be further evaluated with cross-sectional imaging. 2. Multilevel degenerative changes throughout the lumbar spine. 3. Grade 1-2 anterolisthesis of L5 on S1. 4. Osteopenia.     Electronically Signed By: Constance Holster M.D. On: 09/14/2018 19:25 ------- EXAM: PELVIS - 1-2 VIEW   COMPARISON:  None.   FINDINGS: There is no acute displaced fracture or dislocation. There is advanced osteoarthritis of the right hip. There is mild-to-moderate osteoarthritis of the left hip. There are degenerative changes of the pubic symphysis. There is osteopenia.   IMPRESSION: 1. No acute displaced fracture or dislocation. 2. Advanced osteoarthritis of the right hip. 3. Mild-to-moderate osteoarthritis of the left hip. 4. Osteopenia which limits detection of nondisplaced fractures.     Electronically Signed   By: Constance Holster M.D.   On:  09/14/2018 19:20     Objective:  VS:  HT:     WT:    BMI:      BP:    HR: bpm   TEMP: ( )   RESP:  Physical Exam   Imaging: XR C-ARM NO REPORT  Result Date: 05/14/2021 Please see Notes tab for imaging impression.

## 2021-05-14 NOTE — Progress Notes (Signed)
Pt state right hip, buttock and groin area pain. Pt state walking makes the pain worse. Pt state she takes pain meds to help ease her pain.  Numeric Pain Rating Scale and Functional Assessment Average Pain 8   In the last MONTH (on 0-10 scale) has pain interfered with the following?  1. General activity like being  able to carry out your everyday physical activities such as walking, climbing stairs, carrying groceries, or moving a chair?  Rating(10.)   -BT, -Dye Allergies.

## 2021-05-17 ENCOUNTER — Telehealth: Payer: Self-pay | Admitting: Physical Medicine and Rehabilitation

## 2021-05-17 DIAGNOSIS — K219 Gastro-esophageal reflux disease without esophagitis: Secondary | ICD-10-CM | POA: Diagnosis not present

## 2021-05-17 DIAGNOSIS — I130 Hypertensive heart and chronic kidney disease with heart failure and stage 1 through stage 4 chronic kidney disease, or unspecified chronic kidney disease: Secondary | ICD-10-CM | POA: Diagnosis not present

## 2021-05-17 DIAGNOSIS — I5032 Chronic diastolic (congestive) heart failure: Secondary | ICD-10-CM | POA: Diagnosis not present

## 2021-05-17 DIAGNOSIS — M5416 Radiculopathy, lumbar region: Secondary | ICD-10-CM | POA: Diagnosis not present

## 2021-05-17 DIAGNOSIS — N1832 Chronic kidney disease, stage 3b: Secondary | ICD-10-CM | POA: Diagnosis not present

## 2021-05-17 NOTE — Telephone Encounter (Signed)
Patient called advised she need to schedule an appointment with Dr. Ernestina Patches for an injection for her right hip and back. Patient said the injection did not work. The number to contact patient is [phone]

## 2021-05-26 ENCOUNTER — Ambulatory Visit (INDEPENDENT_AMBULATORY_CARE_PROVIDER_SITE_OTHER): Payer: Medicare Other | Admitting: Orthopaedic Surgery

## 2021-05-26 ENCOUNTER — Encounter: Payer: Self-pay | Admitting: Orthopaedic Surgery

## 2021-05-26 ENCOUNTER — Other Ambulatory Visit: Payer: Self-pay

## 2021-05-26 ENCOUNTER — Ambulatory Visit (INDEPENDENT_AMBULATORY_CARE_PROVIDER_SITE_OTHER): Payer: Medicare Other

## 2021-05-26 VITALS — Ht 63.0 in | Wt 212.0 lb

## 2021-05-26 DIAGNOSIS — M25551 Pain in right hip: Secondary | ICD-10-CM | POA: Diagnosis not present

## 2021-05-26 DIAGNOSIS — M79604 Pain in right leg: Secondary | ICD-10-CM

## 2021-05-26 NOTE — Progress Notes (Signed)
Office Visit Note   Patient: Lisa Mcclure           Date of Birth: 1933/01/17           MRN: [id number] Visit Date: 05/26/2021              Requested by: [name], MD [address] [place],  [place] [address] PCP: [name], MD   Assessment & Plan: Visit Diagnoses:  1. Pain in right hip     Plan:   At this point we will obtain MRIs of the lumbar spine to assess for any acute nerve compression to the right side that is causing her this right-sided sciatica and groin pain.  We will also obtain an MRI of her right hip to really rule out a fracture.  I was able to see CT scan from early January that was done of her abdomen pelvis to assess her aneurysm.  She fell after that point.  That CT scan did show significant arthritis of her right hip.  The concern is that there may be a fracture that is causing significant pain.  A steroid injection of the right hip joint did not help her at all.  She will still limit her activities until we obtain the studies.  Follow-Up Instructions: No follow-ups on file.   Orders:  Orders Placed This Encounter  Procedures   XR Pelvis 1-2 Views   No orders of the defined types were placed in this encounter.     Procedures: No procedures performed   Clinical Data: No additional findings.   Subjective: Chief Complaint  Patient presents with   Right Hip - Follow-up  The patient is someone I am seeing for the first time but she is a long established patient of Dr. Ernestina Patches.  She is 88 year old and does ambulate with a rolling walker.  She has been dealing with worsening right hip pain has been more of acute type of pain and they thought it was may be from her growing pool.  She is also been dealing with sciatica on that right side and she has had interventions in her back by Dr. Ernestina Patches and a steroid injection in her right hip.  The right hip steroid injection did not help at all.  She does have significant comorbidities including  congestive heart failure.  She has not aortic abdominal aneurysm that has been treated.  She is on Eliquis as well.  This right hip sciatic pain and groin pain is definitely affecting her mobility, her quality of life, and her actives daily living.  She was sent to me to consider other treatment options.  HPI  Review of Systems There is currently listed no fever, chills, nausea, vomiting  Objective: Vital Signs: Ht 5\' 3"  (1.6 m)    Wt 212 lb (96.2 kg)    BMI 37.55 kg/m   Physical Exam She is alert and orient x3 and in no acute distress Ortho Exam Examination of her right hip shows significant pain in the groin with internal and external rotation and pain over the trochanteric area.  There is also positive straight leg raise to the right side and radiating pain from her sciatic region down to behind her knee. Specialty Comments:  No specialty comments available.  Imaging: XR Pelvis 1-2 Views  Result Date: 05/26/2021 An AP pelvis shows moderate severe arthritis of the right hip and moderate arthritis of the left hip.    PMFS History: Patient Active Problem List  Diagnosis Date Noted   Atrial fibrillation with RVR ([place])    Long term (current) use of anticoagulants    Long term current use of antiarrhythmic drug    AAA (abdominal aortic aneurysm) without rupture 12/20/2019   AAA (abdominal aortic aneurysm) 12/20/2019   Intolerance of continuous positive airway pressure (CPAP) ventilation 11/26/2019   Severe hypoxemia 08/21/2019   Severe obstructive sleep apnea 07/29/2019   Chronic obstructive pulmonary disease ([place]) 07/29/2019   RLS (restless legs syndrome) 07/29/2019   Dehydration with hyponatremia 09/13/2018   Bradycardia 09/13/2018   Glaucoma 09/13/2018   OA (osteoarthritis) of shoulder 05/16/2018   Incomplete emptying of bladder 05/17/2017   Labial adhesions 05/17/2017   Mixed stress and urge urinary incontinence 05/17/2017   Urethral sphincter deficiency, intrinsic (ISD)  05/17/2017   Aortic stenosis, mild 01/15/2016   Bilateral carotid bruits 05/11/2015   Hyponatremia 04/19/2015   Chronic anemia 04/19/2015   Panic attack 04/19/2015   Constipation 04/19/2015   Hyperkalemia 04/17/2015   Influenza A 04/16/2015   Proximal humerus fracture    Left patella fracture    Benign essential HTN    Esophageal reflux    Chronic heart failure with preserved ejection fraction (HFpEF) ([place])    Hypothyroidism    Lower GI bleed 04/08/2015   Hematochezia    Rectal bleeding 04/03/2015   Patellar fracture 03/06/2015   Left humeral fracture 11/04/2013   Rib fracture 11/04/2013   Hypothyroid 11/04/2013   Hypertension 11/04/2013   Right shoulder pain 11/04/2013   OSA (obstructive sleep apnea) 11/13/2008   BOOP (bronchiolitis obliterans with organizing pneumonia) ([place]) 08/25/2008   Past Medical History:  Diagnosis Date   Anemia    h/o hemorrhoidal bleeding and blood transfusion   Cardiomegaly    Chronic diastolic CHF (congestive heart failure) ([facility])    Depression    Diverticulosis    DOE (dyspnea on exertion)    Esophageal reflux    GERD (gastroesophageal reflux disease)    Hypothyroidism    Insomnia    LBP (low back pain)    OAB (overactive bladder)    Obesity    OSA (obstructive sleep apnea)    Osteoarthritis    Rheumatoid arthritis(714.0)    Shoulder pain, bilateral    Unspecified essential hypertension    Venous insufficiency     Family History  Problem Relation Age of Onset   COPD Father    Lung cancer Mother    Heart attack Maternal Grandmother 77   Breast cancer Maternal Aunt    Lung cancer Son     Past Surgical History:  Procedure Laterality Date   ABDOMINAL AORTIC ENDOVASCULAR STENT GRAFT N/A 12/20/2019   Procedure: Aortogram including catheter selection of aorta and bilateral iliac arteriogram, Endovascular repair of infrarenal abdominal aortic aneurysm with bifurcated stent graft (26 mm x 14 x 12 main body, right bell bottom with a 20 mm  x 10 cm piece, and left bell bottom with a 16 mm x 12 cm piece) ;  Surgeon: Marty Heck, MD;  Location: [facility];  Service: Vascular;  Laterality: N/A;   APPENDECTOMY  1953   bladder abduction-1996  1996   breast biopsy Right 1980   CARDIOVERSION N/A 12/24/2019   Procedure: CARDIOVERSION;  Surgeon: Adrian Prows, MD;  Location: [facility] ENDOSCOPY;  Service: Cardiovascular;  Laterality: N/A;   [place] N/A 04/10/2015  Procedure: FLEXIBLE SIGMOIDOSCOPY;  Surgeon: Arta Silence, MD;  Location: [facility] ENDOSCOPY;  Service: Endoscopy;  Laterality: N/A;   knee arthroscopy Right 1996, 2010   KNEE ARTHROSCOPY W/ AUTOGENOUS CARTILAGE IMPLANTATION (ACI) PROCEDURE Left 1994, 1995   REFRACTIVE SURGERY  01/2020   SHOULDER SURGERY  1990   TEE WITHOUT CARDIOVERSION N/A 12/24/2019   Procedure: TRANSESOPHAGEAL ECHOCARDIOGRAM (TEE);  Surgeon: Adrian Prows, MD;  Location: [facility];  Service: Cardiovascular;  Laterality: N/A;   [place] Bilateral 12/20/2019   Procedure: Ultrasound-guided access of bilateral common femoral arteries for delivery of endograft and percutaneous closure;  Surgeon: Marty Heck, MD;  Location: [facility] OR;  Service: Vascular;  Laterality: Bilateral;   VESICOVAGINAL FISTULA CLOSURE W/ TAH     WRIST SURGERY  1967   Social History   Occupational History   Occupation: Retired  Tobacco Use   Smoking status: Former    Packs/day: 0.20    Years: 10.00    Pack years: 2.00    Types: Cigarettes    Quit date: 04/11/1984    Years since quitting: 37.1   Smokeless tobacco: Never  Vaping Use   Vaping Use: Never used  Substance and Sexual Activity   Alcohol use: Yes    Alcohol/week: 0.0 standard drinks    Comment: cocktail once a week   Drug use: No   Sexual activity: Not on file

## 2021-05-28 ENCOUNTER — Other Ambulatory Visit (HOSPITAL_COMMUNITY): Payer: Self-pay | Admitting: Internal Medicine

## 2021-05-28 ENCOUNTER — Other Ambulatory Visit: Payer: Self-pay | Admitting: Internal Medicine

## 2021-05-28 DIAGNOSIS — M5416 Radiculopathy, lumbar region: Secondary | ICD-10-CM

## 2021-05-31 ENCOUNTER — Other Ambulatory Visit: Payer: Self-pay

## 2021-05-31 ENCOUNTER — Ambulatory Visit (HOSPITAL_COMMUNITY)
Admission: RE | Admit: 2021-05-31 | Discharge: 2021-05-31 | Disposition: A | Discharge status: Home / Self Care | Payer: Medicare Other | Source: Ambulatory Visit | Attending: Internal Medicine | Admitting: Internal Medicine

## 2021-05-31 DIAGNOSIS — M5416 Radiculopathy, lumbar region: Secondary | ICD-10-CM | POA: Insufficient documentation

## 2021-05-31 DIAGNOSIS — M5126 Other intervertebral disc displacement, lumbar region: Secondary | ICD-10-CM | POA: Diagnosis not present

## 2021-05-31 DIAGNOSIS — M47816 Spondylosis without myelopathy or radiculopathy, lumbar region: Secondary | ICD-10-CM | POA: Diagnosis not present

## 2021-06-03 ENCOUNTER — Other Ambulatory Visit: Payer: Self-pay | Admitting: Cardiology

## 2021-06-09 ENCOUNTER — Other Ambulatory Visit: Payer: Self-pay

## 2021-06-09 ENCOUNTER — Encounter: Payer: Self-pay | Admitting: Student

## 2021-06-09 ENCOUNTER — Ambulatory Visit: Payer: Medicare Other | Admitting: Student

## 2021-06-09 VITALS — BP 126/57 | HR 60 | Temp 97.1°F | Ht 63.0 in | Wt 218.0 lb

## 2021-06-09 DIAGNOSIS — I5032 Chronic diastolic (congestive) heart failure: Secondary | ICD-10-CM

## 2021-06-09 DIAGNOSIS — I480 Paroxysmal atrial fibrillation: Secondary | ICD-10-CM

## 2021-06-09 DIAGNOSIS — R0609 Other forms of dyspnea: Secondary | ICD-10-CM

## 2021-06-09 MED ORDER — ALBUTEROL SULFATE (2.5 MG/3ML) 0.083% IN NEBU: 2.5000 mg | INHALATION_SOLUTION | Freq: Four times a day (QID) | RESPIRATORY_TRACT | 0 refills | Status: DC | PRN

## 2021-06-09 NOTE — Patient Instructions (Signed)
Labs in 1 week 

## 2021-06-09 NOTE — Progress Notes (Signed)
Primary Physician/Referring:  [name], MD  [name], female    DOB: [birth year], 88 y.o.   MRN: [id number]  Chief Complaint  Patient presents with   Congestive Heart Failure   Follow-up   Shortness of Breath   HPI:    Lisa Mcclure  is a 88 y.o.  female  with past medical history of obesity, chronic diastolic CHF, hypertensive heart disease, paroxysmal atrial fibrillation, anemia, hemorrhoids, diverticulosis, esophageal reflux, depression, OSA intolerant to CPAP on nocturnal 02, rheumatoid arthritis, and venous insufficiency. CT scan of the chest 03/2016 without definitive ILD. LM and 2 vessel CAD, mild calcification on mitral and aortic valve. S/p AAA repair on 12/20/2019.  Her last episode of atrial fibrillation was in August 2021.    Patient presents for urgent visit at her request with complaints of shortness of breath worsening over the last few weeks and weight increased by about 6 pounds in the last 2 weeks.  Patient has been dealing with significant back and hip pain for which she is following with neurosurgery and orthopedics.  Patient's physical activity has therefore been limited and she has not done pulmonary rehab as discussed following last office visit.  Patient continues to take Lasix 20 mg daily with an additional dose occasionally over the last couple of weeks.  Past Medical History:  Diagnosis Date   Anemia    h/o hemorrhoidal bleeding and blood transfusion   Cardiomegaly    Chronic diastolic CHF (congestive heart failure) (HCC)    Depression    Diverticulosis    DOE (dyspnea on exertion)    Esophageal reflux    GERD (gastroesophageal reflux disease)    Hypothyroidism    Insomnia    LBP (low back pain)    OAB (overactive bladder)    Obesity    OSA (obstructive sleep apnea)    Osteoarthritis    Rheumatoid arthritis(714.0)    Shoulder pain, bilateral    Unspecified essential hypertension    Venous insufficiency    Past  Surgical History:  Procedure Laterality Date   ABDOMINAL AORTIC ENDOVASCULAR STENT GRAFT N/A 12/20/2019   Procedure: Aortogram including catheter selection of aorta and bilateral iliac arteriogram, Endovascular repair of infrarenal abdominal aortic aneurysm with bifurcated stent graft (26 mm x 14 x 12 main body, right bell bottom with a 20 mm x 10 cm piece, and left bell bottom with a 16 mm x 12 cm piece) ;  Surgeon: Marty Heck, MD;  Location: [place];  Service: Vascular;  Laterality: N/A;   APPENDECTOMY  1953   bladder abduction-1996  1996   breast biopsy Right 1980   CARDIOVERSION N/A 12/24/2019   Procedure: CARDIOVERSION;  Surgeon: Adrian Prows, MD;  Location: [facility] ENDOSCOPY;  Service: Cardiovascular;  Laterality: N/A;   [place] N/A 04/10/2015   Procedure: FLEXIBLE SIGMOIDOSCOPY;  Surgeon: Arta Silence, MD;  Location: [facility] ENDOSCOPY;  Service: Endoscopy;  Laterality: N/A;   knee arthroscopy Right 1996, 2010   KNEE ARTHROSCOPY W/ AUTOGENOUS CARTILAGE IMPLANTATION (ACI) PROCEDURE Left 1994, 1995   REFRACTIVE SURGERY  01/2020   SHOULDER SURGERY  1990   TEE WITHOUT CARDIOVERSION N/A 12/24/2019   Procedure: TRANSESOPHAGEAL ECHOCARDIOGRAM (TEE);  Surgeon: Adrian Prows, MD;  Location: [place];  Service: Cardiovascular;  Laterality: N/A;   [place]  FOR VASCULAR ACCESS Bilateral 12/20/2019   Procedure: Ultrasound-guided access of bilateral common femoral arteries for delivery of endograft and percutaneous closure;  Surgeon: Marty Heck, MD;  Location: [facility] OR;  Service: Vascular;  Laterality: Bilateral;   VESICOVAGINAL FISTULA CLOSURE W/ TAH     WRIST SURGERY  1967   Family History  Problem Relation Age of Onset   COPD Father    Lung cancer Mother    Heart attack Maternal Grandmother 10   Breast cancer Maternal Aunt    Lung cancer  Son     Social History   Tobacco Use   Smoking status: Former    Packs/day: 0.20    Years: 10.00    Pack years: 2.00    Types: Cigarettes    Quit date: 04/11/1984    Years since quitting: 37.1   Smokeless tobacco: Never  Substance Use Topics   Alcohol use: Yes    Alcohol/week: 0.0 standard drinks    Comment: cocktail once a week   Marital Status: Widowed  ROS  Review of Systems  Cardiovascular:  Positive for dyspnea on exertion (worsening) and leg swelling. Negative for chest pain, orthopnea, palpitations, paroxysmal nocturnal dyspnea and syncope.  Respiratory:  Positive for wheezing. Negative for cough and hemoptysis.   Genitourinary:  Positive for bladder incontinence and urgency.   Objective  Blood pressure (!) 126/57, pulse 60, temperature (!) 97.1 F (36.2 C), temperature source Temporal, height 5' 3" (1.6 m), weight 218 lb (98.9 kg), SpO2 92 %.  Vitals with BMI 06/09/2021 05/26/2021 05/13/2021  Height 5' 3" 5' 3" -  Weight 218 lbs 212 lbs -  BMI 95.62 13.08 -  Systolic 657 - 91  Diastolic 57 - 53  Pulse 60 - 60     Physical Exam Vitals reviewed.  Constitutional:      General: She is not in acute distress.    Appearance: She is well-developed.  Cardiovascular:     Rate and Rhythm: Normal rate and regular rhythm. No extrasystoles are present.    Pulses: Intact distal pulses.          Carotid pulses are 2+ on the right side and 2+ on the left side.      Radial pulses are 2+ on the right side and 2+ on the left side.     Heart sounds: S1 normal and S2 normal. Murmur heard.  Harsh midsystolic murmur is present with a grade of 3/6 at the upper right sternal border radiating to the apex.    No gallop.     Comments: No JVD.  Pulmonary:     Effort: Pulmonary effort is normal. No accessory muscle usage or respiratory distress.     Breath sounds: Rales (bilateral bases) present. No rhonchi.     Comments: Upper airway expiratory wheezing Musculoskeletal:        General:  Tenderness present.     Right lower leg: Edema (1+) present.     Left lower leg: Edema (1+) present.  Neurological:     Mental Status: She is alert.   Laboratory examination:   Recent Labs    08/11/20 1525 10/28/20 1619 11/12/20 1555  NA 139 140 140  K 4.9 4.8 4.8  CL 99 96 100  CO2 _0 GLUCOSE 88 93 93  BUN 25 38* 43*  CREATININE 1.15* 1.20* 1.53*  CALCIUM 9.0 9.1 8.8   CrCl cannot be calculated (Patient's most recent lab result is older than the maximum 21 days allowed.).  CMP Latest Ref Rng & Units 11/12/2020 10/28/2020 08/11/2020  Glucose 65 - 99 mg/dL 93 93 88  BUN 8 - 27 mg/dL 43(H) 38(H) 25  Creatinine 0.57 - 1.00 mg/dL 1.53(H) 1.20(H) 1.15(H)  Sodium 134 - 144 mmol/L 140 140 139  Potassium 3.5 - 5.2 mmol/L 4.8 4.8 4.9  Chloride 96 - 106 mmol/L 100 96 99  CO2 20 - 29 mmol/L _0 Calcium 8.7 - 10.3 mg/dL 8.8 9.1 9.0  Total Protein 6.5 - 8.1 g/dL - - -  Total Bilirubin 0.3 - 1.2 mg/dL - - -  Alkaline Phos 38 - 126 U/L - - -  AST 15 - 41 U/L - - -  ALT 0 - 44 U/L - - -   CBC Latest Ref Rng & Units 08/04/2020 12/25/2019 12/24/2019  WBC 3.4 - 10.8 x10E3/uL 6.6 8.8 10.5  Hemoglobin 11.1 - 15.9 g/dL 11.8 9.1(L) 9.1(L)  Hematocrit 34.0 - 46.6 % 36.2 30.7(L) 30.0(L)  Platelets 150 - 450 x10E3/uL 393 309 317   Lipid Panel    Component Value Date/Time   CHOL 126 03/14/2019 1121   TRIG 82 03/14/2019 1121   HDL 49 03/14/2019 1121   CHOLHDL 5.1 CALC 12/19/2006 0916   VLDL 26 12/19/2006 0916   LDLCALC 61 03/14/2019 1121   LDLDIRECT 159.2 12/19/2006 0916   HEMOGLOBIN A1C No results found for: HGBA1C, MPG TSH No results for input(s): TSH in the last 8760 hours.  External labs:  02/22/2021: BUN 36, creatinine 1.3, GFR 39, sodium 130, potassium 5.4 Hgb 11.3, HCT 35.1, platelet 336 Magnesium 2.4  07/20/2020: Glucose 84, BUN 30, creatinine 1.22, GFR 48, sodium 137, potassium 4.9,  BNP 321  Cholesterol, total 164.000 m 06/21/2019 HDL 46 MG/DL 06/21/2019 LDL  99.000 mg 06/21/2019 Triglycerides 93.000 06/21/2019  04/14/2020: Serum glucose 86 mg, BUN 22, creatinine 1.13, EGFR 44 mL, sodium 138, potassium 4.3.  CMP otherwise normal. Hb 10.2/HCT 31.5, platelets 383.  02/17/2020:  Hemoglobin 11.1, hematocrit 34.2, platelets 383 BUN 26, creatinine 0.9, EGFR 59, sodium 136, potassium 4.6  01/07/2020: BUN 29, GFR 57, creatinine 0.93, BUN/CR 31, sodium 133, potassium 4.6, chloride 93, BMP otherwise normal  Cholesterol, total 164.000 m 06/21/2019 Triglycerides 93.000 06/21/2019 HDL 46 MG/DL 06/21/2019 LDL 99.000 mg 06/21/2019  Glucose Random 87.000 mg 06/21/2019 BUN 25.000 mg 06/21/2019 Creatinine, Serum 0.800 mg/ 06/21/2019  TSH 3.220 micr 06/21/2019  AST 18, ALT 11 09/14/2018  FOBT: Normal 04/24/2018  MicroAlbumin Urine 13.000 03/23/2017 MicroAlbumin/Creat 37.9 MG/ 03/23/2017  Allergies   Allergies  Allergen Reactions   Statins Other (See Comments)    Muscle aches and INTERNAL BLEEDING   Atorvastatin     Other reaction(s): Myalgias   Gabapentin Other (See Comments)    "Made me loopy"   Methocarbamol Other (See Comments)    "Made me loopy"    Medications Prior to Visit:   Outpatient Medications Prior to Visit  Medication Sig Dispense Refill   amiodarone (PACERONE) 100 MG tablet Take 2 tablets (200 mg total) by mouth daily. 90 tablet 2   amLODipine (NORVASC) 10 MG tablet TAKE ONE TABLET BY MOUTH AT NOON 90 tablet 1   apixaban (ELIQUIS) 2.5 MG TABS tablet Take 1 tablet (2.5 mg total) by mouth 2 (two) times daily. 180 tablet 3   Bempedoic Acid-Ezetimibe (NEXLIZET) 180-10 MG TABS Take 1 capsule by mouth daily. (Patient taking differently: Take 1 capsule by mouth daily. Taking it at bedtime) 30 tablet 3   carvedilol (COREG) 12.5 MG tablet TAKE  ONE TABLET BY MOUTH AT BREAKFAST AND AT BEDTIME 180 tablet 2   Cholecalciferol (VITAMIN D3) 125 MCG (5000 UT) CAPS Take 5,000 Units by mouth daily with breakfast. Taking it at lunchtime     diclofenac  sodium (VOLTAREN) 1 % GEL Apply 2.25 g topically 3 (three) times daily as needed (for pain).      furosemide (LASIX) 20 MG tablet Take 1 tablet (20 mg total) by mouth daily as needed for fluid or edema. Take Lasix 20 mg once daily with an additional dose as needed for fluid overload. 90 tablet 3   HYDROcodone-acetaminophen (NORCO) 7.5-325 MG tablet Take 1 tablet by mouth every 6 (six) hours as needed for moderate pain. 20 tablet 0   isosorbide-hydrALAZINE (BIDIL) 20-37.5 MG tablet Take 1 tablet by mouth 3 (three) times daily.     latanoprost (XALATAN) 0.005 % ophthalmic solution Place 1 drop into both eyes at bedtime.     levothyroxine (SYNTHROID) 125 MCG tablet Take 125 mcg by mouth daily before breakfast.      losartan (COZAAR) 100 MG tablet TAKE ONE TABLET BY MOUTH AT NOON 180 tablet 1   Multiple Vitamins-Minerals (ICAPS AREDS 2 PO) Take by mouth.     omeprazole (PRILOSEC) 20 MG capsule Take 1 capsule (20 mg total) by mouth daily. 30 capsule 11   rOPINIRole (REQUIP) 1 MG tablet Take 1 mg by mouth See admin instructions. Take 1 mg by mouth once a day between 3 PM-4 PM and an additional 1 mg in the evening, if no relief  12   spironolactone (ALDACTONE) 25 MG tablet TAKE 1/2 TABLET BY MOUTH ONCE DAILY 15 tablet 3   No facility-administered medications prior to visit.   Final Medications at End of Visit    Current Meds  Medication Sig   albuterol (PROVENTIL) (2.5 MG/3ML) 0.083% nebulizer solution Take 3 mLs (2.5 mg total) by nebulization every 6 (six) hours as needed for wheezing or shortness of breath.   amiodarone (PACERONE) 100 MG tablet Take 2 tablets (200 mg total) by mouth daily.   amLODipine (NORVASC) 10 MG tablet TAKE ONE TABLET BY MOUTH AT NOON   apixaban (ELIQUIS) 2.5 MG TABS tablet Take 1 tablet (2.5 mg total) by mouth 2 (two) times daily.   Bempedoic Acid-Ezetimibe (NEXLIZET) 180-10 MG TABS Take 1 capsule by mouth daily. (Patient taking differently: Take 1 capsule by mouth daily.  Taking it at bedtime)   carvedilol (COREG) 12.5 MG tablet TAKE ONE TABLET BY MOUTH AT BREAKFAST AND AT BEDTIME   Cholecalciferol (VITAMIN D3) 125 MCG (5000 UT) CAPS Take 5,000 Units by mouth daily with breakfast. Taking it at lunchtime   diclofenac sodium (VOLTAREN) 1 % GEL Apply 2.25 g topically 3 (three) times daily as needed (for pain).    furosemide (LASIX) 20 MG tablet Take 1 tablet (20 mg total) by mouth daily as needed for fluid or edema. Take Lasix 20 mg once daily with an additional dose as needed for fluid overload.   HYDROcodone-acetaminophen (NORCO) 7.5-325 MG tablet Take 1 tablet by mouth every 6 (six) hours as needed for moderate pain.   isosorbide-hydrALAZINE (BIDIL) 20-37.5 MG tablet Take 1 tablet by mouth 3 (three) times daily.   latanoprost (XALATAN) 0.005 % ophthalmic solution Place 1 drop into both eyes at bedtime.   levothyroxine (SYNTHROID) 125 MCG tablet Take 125 mcg by mouth daily before breakfast.    losartan (COZAAR) 100 MG tablet TAKE ONE TABLET BY MOUTH AT NOON   Multiple Vitamins-Minerals (ICAPS  AREDS 2 PO) Take by mouth.   omeprazole (PRILOSEC) 20 MG capsule Take 1 capsule (20 mg total) by mouth daily.   rOPINIRole (REQUIP) 1 MG tablet Take 1 mg by mouth See admin instructions. Take 1 mg by mouth once a day between 3 PM-4 PM and an additional 1 mg in the evening, if no relief   spironolactone (ALDACTONE) 25 MG tablet TAKE 1/2 TABLET BY MOUTH ONCE DAILY    Radiology:   CT Abdomen and Pelvis W Contrast 12/20/2019: 1. Large Infrarenal Abdominal Aortic Aneurysm measuring up to 5.7 cm with mild perianeurysmal inflammation. Differential considerations include impending AAA rupture, mycotic AAA, symptomatic AAA. Recommend urgent transfer to emergency department and Vascular Surgery consultation. Aortic aneurysm NOS (ICD10-I71.9). Aortic Atherosclerosis (ICD10-I70.0). 2. No other acute or inflammatory process identified. 5 cm fat containing left pelvic hernia. Large bowel  diverticulosis. 3. Simple appearing 4 cm left ovarian cyst, recommend follow-up Ultrasound in 6-12 months  Chest X-ray 12/20/2019:  Diffuse cardiac enlargement. Emphysematous changes in the lungs. Coarse interstitial pattern to the lungs could represent fibrosis or edema. No focal consolidation. No pleural effusions. No pneumothorax. Calcified and tortuous aorta. Degenerative changes in the shoulders and spine. Old rib fractures. Similar appearance to previous study. IMPRESSION: 1. Cardiac enlargement. 2. Coarse interstitial pattern to the lungs could represent fibrosis or edema.  Abdominal X-Ray 12/20/2019:  Interval placement of aorto iliac stent graft. Nonobstructive bowel gas pattern.  Cardiac Studies:   Lexiscan myoview stress test 12/08/2017: 1. Lexiscan stress test was performed. Exercise capacity was not assessed. Stress symptoms included abdominal pain. Blood pressure was normal. The resting and stress electrocardiogram demonstrated normal sinus rhythm, normal resting conduction, no resting arrhythmias and normal rest repolarization. 2. The overall quality of the study is good. There is no evidence of abnormal lung activity. Stress and rest SPECT images demonstrate homogeneous tracer distribution throughout the myocardium. Gated SPECT imaging reveals normal myocardial thickening and wall motion. The left ventricular ejection fraction was normal (54%). 3. Low risk study  Renal artery duplex 09/27/2017: Hemodynamically significant stenosis bilaterally. Renal length is within normal limits for both kidneys. There is an incidental 3.9x3.4x3.4 cm proximal and mid abdominal aortic aneurysm. Recommend dedicated abdominal aortic duplex scan to further define AAA.  Abdominal Aortic Duplex  01/01/2019: Moderate dilatation of the abdominal aorta is noted in the proximal, mid and distal aorta. An abdominal aortic aneurysm measuring 4.5 x 4.52 x 4.52 cm is seen.  Recheck in 6 months for  stability of the aneurysm.  TEE 12/24/2019:  1. Left ventricular ejection fraction, by estimation, is 50 to 55%. The left ventricle has low normal function. Left ventricular diastolic function could not be evaluated.   2. Right ventricular systolic function is normal. The right ventricular size is normal.   3. Left atrial size was moderately dilated. No left atrial/left atrial appendage thrombus was detected. The LAA emptying velocity was 30 cm/s.   4. The mitral valve is normal in structure. Mild mitral valve regurgitation. No evidence of mitral stenosis.   5. The aortic valve is calcified. Aortic valve regurgitation is not visualized. Mild aortic valve stenosis. Aortic valve mean gradient measures 10.8 mmHg.   6. There is Moderate (Grade III) layered plaque involving the ascending, transverse and descending aorta.   Ambulatory cardiac telemetry 14 days 03/22/2021 - 04/04/2021: Predominant underlying rhythm was sinus with first-degree AV block.  Patient with 19 brief episodes of SVT which were asymptomatic.  Frequent PVCs with 20.4% burden and episodes of ventricular  bigeminy and trigeminy which correlated with patient triggered events.  No evidence of atrial fibrillation, ventricular tachycardia, pauses >3 seconds, or high degree AV block.  Echocardiogram 04/20/2021:  Normal LV systolic function with visual EF 55-60%. Left ventricle cavity  is normal in size. Moderate left ventricular hypertrophy. Normal global  wall motion. Indeterminate diastolic filling pattern.  Left atrial cavity is severely dilated.  Moderate aortic valve stenosis (peak velocity 3.22ms, MG 23.042mG, AVA  (VTI) measures 1.3 cm^2, DI 0.4).  Mild (Grade I) mitral regurgitation.  Mild tricuspid regurgitation. Mild pulmonary hypertension. RVSP measures  42 mmHg.  IVC is dilated with a respiratory response of >50%.  Compared to study 05/27/2020 LVEF remains preserved, G2DD is now  indeterminate, mild LAE is now severe,  mild AS is now moderate, mild PHTN  is new finding (RVSP 4274m).   EKG  06/09/2021: Sinus rhythm at a rate of 50 bpm.  First-degree AV block.  Normal axis.  No evidence of ischemia or underlying injury pattern.  Compared EKG 03/22/2021, no PVC.   03/22/2021: Sinus rhythm with first-degree AV block with frequent PVCs at a rate of 86 bpm.  02/16/2021: Sinus rhythm with frequent PVCs in a trigeminal pattern and a rate of 90 bpm.  Normal axis.  Poor R wave progression, cannot exclude anteroseptal infarct old.  05/31/2020: Sinus rhythm with borderline first-degree AV block at rate of 61 bpm, left atrial enlargement, normal axis.  Poor R wave progression, probably normal variant but cannot exclude anteroseptal infarct old.  No evidence of ischemia, normal QT interval. No significant change from 01/02/2020.  Assessment     ICD-10-CM   1. Paroxysmal atrial fibrillation (HCC)  I48.0 EKG 12-Lead    2. Chronic diastolic (congestive) heart failure (HCC)  I50.32 Brain natriuretic peptide    Basic metabolic panel    AMB referral to pulmonary rehabilitation    3. Dyspnea on exertion  R06.09 AMB referral to pulmonary rehabilitation     This patients CHA2DS2-VASc Score 6 (CHF, HTN, vasc, age, F) and yearly risk of stroke 9.8%.  Meds ordered this encounter  Medications   albuterol (PROVENTIL) (2.5 MG/3ML) 0.083% nebulizer solution    Sig: Take 3 mLs (2.5 mg total) by nebulization every 6 (six) hours as needed for wheezing or shortness of breath.    Dispense:  75 mL    Refill:  0    There are no discontinued medications.  Recommendations:   Lisa DEMAUROs a 88 82o.  female  with past medical history of obesity, chronic diastolic CHF, hypertensive heart disease, paroxysmal atrial fibrillation, anemia, hemorrhoids, diverticulosis, esophageal reflux, depression, OSA intolerant to CPAP on nocturnal 02, rheumatoid arthritis, and venous insufficiency. CT scan of the chest 03/2016 without definitive  ILD. LM and 2 vessel CAD, mild calcification on mitral and aortic valve. S/p AAA repair on 12/20/2019.    Patient presents for urgent visit with complaints of worsening shortness of breath, weight gain, and fatigue over the last 2 weeks.  Advised patient to increase Lasix to 20 mg twice daily for the next 5 days and have repeat BMP and BNP done.  She does appear mildly volume overloaded on exam.  Reviewed and discussed results of echocardiogram and cardiac monitor, details above.  Patient is still a good candidate for pulmonary rehab when she is able from a physical standpoint given back and hip pain currently.  On exam patient also has upper airway expiratory wheezing, I have therefore prescribed an albuterol inhaler to be  taken as needed.  Encourage patient to also follow-up with her PCP for further evaluation of noncardiac etiology contributing to patient's shortness of breath.  Follow-up in 2-3 weeks, sooner if needed.   Alethia Berthold, PA-C 06/09/2021, 12:06 PM Office: [phone]

## 2021-06-10 ENCOUNTER — Other Ambulatory Visit: Payer: Self-pay | Admitting: Pharmacist

## 2021-06-10 DIAGNOSIS — R0609 Other forms of dyspnea: Secondary | ICD-10-CM

## 2021-06-10 MED ORDER — ALBUTEROL SULFATE HFA 108 (90 BASE) MCG/ACT IN AERS: 2.0000 | INHALATION_SPRAY | Freq: Four times a day (QID) | RESPIRATORY_TRACT | 2 refills | Status: DC | PRN

## 2021-06-14 ENCOUNTER — Telehealth: Payer: Self-pay

## 2021-06-14 NOTE — Telephone Encounter (Signed)
Can you follow up with her? Looks like she is down 6 lbs but still quite symptomatic. I am wondering if there is non-cardiac contributor and if she needs to be evaluated in ED if she is so symptomatic or if we need to adjust meds given soft BP. Keep me posted. Thank you in advance!

## 2021-06-14 NOTE — Telephone Encounter (Signed)
Pt called and stated that she is feeling weaker and her oxygen has been around 80-90 and she has been using her oxygen more often than usual. She is still very SOB and does not feel like she has nearly as much energy as she did. She is concerned and lives alone so she does not want to have to have someone take care of her. She also started to feel light headed a few times, especially when she bends down. She has been experiencing blurred vision. She has been drinking fluids with electrolytes to try and feel better but it is not helping. She has been taking the Lasix twice a day. She has gotten down to 212 pounds.Pt checked BP while in the phone and it was 107/50 HR 69. ?

## 2021-06-14 NOTE — Telephone Encounter (Signed)
Called and followed up with pt. Pt reports that she had taken off her nasal canula to go the the bathroom prior to the episodes. Pt's O2 stats improved to 97% and HR: 59 after having her nasal canula back on while on the phone. Pt was previously recommended to increase her flow rate from 2L to 3-4 L to ensure her O2 stats remain >95%. Pt currently stable on 3 L of O2.  ? ?Per pt, she feels like she hasnt had any significant improvement in her breathing concerns since increasing her lasix from 20 mg Qday to BID dosing and using her PRN albuterol inhaler. Continues to complains of dyspnea, SOB, fatigueness, tiredness. Recently also had exacerbation in her lower back pain that has been contributing to her overall symptoms. Pt currently unable to follow up with PCP due to the PCP office having a recent leak and not currently seeing pts.  ? ?Pt unwilling to go to the ER due to the long wait time, but would be open to go to the hospital if she could be admitted directly by either Dr. Einar Gip or [facility]. Pt currently in no acute distress and home vitals continue to remain stable warranting direct hospital admission. Pt agrees to continue monitor her symptoms and continue current therapy. Recommended pt to use her nasal cannula routinely as much as possible if it helps maintain her O2 stats and provides mild relief.

## 2021-06-15 ENCOUNTER — Emergency Department (HOSPITAL_COMMUNITY): Payer: Medicare Other

## 2021-06-15 ENCOUNTER — Encounter (HOSPITAL_COMMUNITY): Payer: Self-pay

## 2021-06-15 ENCOUNTER — Inpatient Hospital Stay (HOSPITAL_COMMUNITY)
Admission: EM | Admit: 2021-06-15 | Discharge: 2021-06-21 | DRG: 377 | Disposition: A | Discharge status: Home Health | Payer: Medicare Other | Source: Ambulatory Visit | Attending: Internal Medicine | Admitting: Internal Medicine

## 2021-06-15 ENCOUNTER — Other Ambulatory Visit: Payer: Self-pay

## 2021-06-15 DIAGNOSIS — K2970 Gastritis, unspecified, without bleeding: Secondary | ICD-10-CM | POA: Diagnosis not present

## 2021-06-15 DIAGNOSIS — D500 Iron deficiency anemia secondary to blood loss (chronic): Secondary | ICD-10-CM | POA: Diagnosis present

## 2021-06-15 DIAGNOSIS — I10 Essential (primary) hypertension: Secondary | ICD-10-CM | POA: Diagnosis present

## 2021-06-15 DIAGNOSIS — I350 Nonrheumatic aortic (valve) stenosis: Secondary | ICD-10-CM | POA: Diagnosis present

## 2021-06-15 DIAGNOSIS — M069 Rheumatoid arthritis, unspecified: Secondary | ICD-10-CM | POA: Diagnosis present

## 2021-06-15 DIAGNOSIS — Z6837 Body mass index (BMI) 37.0-37.9, adult: Secondary | ICD-10-CM

## 2021-06-15 DIAGNOSIS — R0602 Shortness of breath: Principal | ICD-10-CM

## 2021-06-15 DIAGNOSIS — R0982 Postnasal drip: Secondary | ICD-10-CM | POA: Diagnosis not present

## 2021-06-15 DIAGNOSIS — E785 Hyperlipidemia, unspecified: Secondary | ICD-10-CM | POA: Diagnosis present

## 2021-06-15 DIAGNOSIS — E669 Obesity, unspecified: Secondary | ICD-10-CM | POA: Diagnosis present

## 2021-06-15 DIAGNOSIS — R0600 Dyspnea, unspecified: Secondary | ICD-10-CM | POA: Diagnosis not present

## 2021-06-15 DIAGNOSIS — I129 Hypertensive chronic kidney disease with stage 1 through stage 4 chronic kidney disease, or unspecified chronic kidney disease: Secondary | ICD-10-CM | POA: Diagnosis not present

## 2021-06-15 DIAGNOSIS — G4733 Obstructive sleep apnea (adult) (pediatric): Secondary | ICD-10-CM | POA: Diagnosis present

## 2021-06-15 DIAGNOSIS — J691 Pneumonitis due to inhalation of oils and essences: Secondary | ICD-10-CM | POA: Diagnosis not present

## 2021-06-15 DIAGNOSIS — R531 Weakness: Secondary | ICD-10-CM

## 2021-06-15 DIAGNOSIS — G2581 Restless legs syndrome: Secondary | ICD-10-CM | POA: Diagnosis present

## 2021-06-15 DIAGNOSIS — J9601 Acute respiratory failure with hypoxia: Secondary | ICD-10-CM | POA: Diagnosis not present

## 2021-06-15 DIAGNOSIS — D649 Anemia, unspecified: Secondary | ICD-10-CM | POA: Diagnosis not present

## 2021-06-15 DIAGNOSIS — Z66 Do not resuscitate: Secondary | ICD-10-CM | POA: Diagnosis present

## 2021-06-15 DIAGNOSIS — J690 Pneumonitis due to inhalation of food and vomit: Secondary | ICD-10-CM | POA: Diagnosis not present

## 2021-06-15 DIAGNOSIS — Z91199 Patient's noncompliance with other medical treatment and regimen due to unspecified reason: Secondary | ICD-10-CM

## 2021-06-15 DIAGNOSIS — Z9981 Dependence on supplemental oxygen: Secondary | ICD-10-CM

## 2021-06-15 DIAGNOSIS — I700 Atherosclerosis of aorta: Secondary | ICD-10-CM | POA: Diagnosis not present

## 2021-06-15 DIAGNOSIS — Z95828 Presence of other vascular implants and grafts: Secondary | ICD-10-CM | POA: Diagnosis not present

## 2021-06-15 DIAGNOSIS — D62 Acute posthemorrhagic anemia: Secondary | ICD-10-CM | POA: Diagnosis not present

## 2021-06-15 DIAGNOSIS — Z825 Family history of asthma and other chronic lower respiratory diseases: Secondary | ICD-10-CM

## 2021-06-15 DIAGNOSIS — E8729 Other acidosis: Secondary | ICD-10-CM | POA: Diagnosis present

## 2021-06-15 DIAGNOSIS — G8929 Other chronic pain: Secondary | ICD-10-CM | POA: Diagnosis present

## 2021-06-15 DIAGNOSIS — J9811 Atelectasis: Secondary | ICD-10-CM | POA: Diagnosis present

## 2021-06-15 DIAGNOSIS — Z7901 Long term (current) use of anticoagulants: Secondary | ICD-10-CM | POA: Diagnosis not present

## 2021-06-15 DIAGNOSIS — Z801 Family history of malignant neoplasm of trachea, bronchus and lung: Secondary | ICD-10-CM

## 2021-06-15 DIAGNOSIS — N170 Acute kidney failure with tubular necrosis: Secondary | ICD-10-CM

## 2021-06-15 DIAGNOSIS — Z91119 Patient's noncompliance with dietary regimen due to unspecified reason: Secondary | ICD-10-CM

## 2021-06-15 DIAGNOSIS — Z20822 Contact with and (suspected) exposure to covid-19: Secondary | ICD-10-CM | POA: Diagnosis present

## 2021-06-15 DIAGNOSIS — N179 Acute kidney failure, unspecified: Secondary | ICD-10-CM | POA: Diagnosis not present

## 2021-06-15 DIAGNOSIS — I493 Ventricular premature depolarization: Secondary | ICD-10-CM | POA: Diagnosis present

## 2021-06-15 DIAGNOSIS — N1832 Chronic kidney disease, stage 3b: Secondary | ICD-10-CM | POA: Diagnosis not present

## 2021-06-15 DIAGNOSIS — J9621 Acute and chronic respiratory failure with hypoxia: Secondary | ICD-10-CM | POA: Diagnosis present

## 2021-06-15 DIAGNOSIS — Z79899 Other long term (current) drug therapy: Secondary | ICD-10-CM

## 2021-06-15 DIAGNOSIS — E039 Hypothyroidism, unspecified: Secondary | ICD-10-CM | POA: Diagnosis not present

## 2021-06-15 DIAGNOSIS — J9622 Acute and chronic respiratory failure with hypercapnia: Secondary | ICD-10-CM | POA: Diagnosis present

## 2021-06-15 DIAGNOSIS — Z803 Family history of malignant neoplasm of breast: Secondary | ICD-10-CM

## 2021-06-15 DIAGNOSIS — I517 Cardiomegaly: Secondary | ICD-10-CM | POA: Diagnosis not present

## 2021-06-15 DIAGNOSIS — R5381 Other malaise: Secondary | ICD-10-CM | POA: Diagnosis present

## 2021-06-15 DIAGNOSIS — K254 Chronic or unspecified gastric ulcer with hemorrhage: Principal | ICD-10-CM | POA: Diagnosis present

## 2021-06-15 DIAGNOSIS — J449 Chronic obstructive pulmonary disease, unspecified: Secondary | ICD-10-CM | POA: Diagnosis present

## 2021-06-15 DIAGNOSIS — I5033 Acute on chronic diastolic (congestive) heart failure: Secondary | ICD-10-CM | POA: Diagnosis not present

## 2021-06-15 DIAGNOSIS — I5032 Chronic diastolic (congestive) heart failure: Secondary | ICD-10-CM | POA: Diagnosis present

## 2021-06-15 DIAGNOSIS — I480 Paroxysmal atrial fibrillation: Secondary | ICD-10-CM

## 2021-06-15 DIAGNOSIS — Z79890 Hormone replacement therapy (postmenopausal): Secondary | ICD-10-CM

## 2021-06-15 DIAGNOSIS — K3189 Other diseases of stomach and duodenum: Secondary | ICD-10-CM | POA: Diagnosis not present

## 2021-06-15 DIAGNOSIS — K219 Gastro-esophageal reflux disease without esophagitis: Secondary | ICD-10-CM | POA: Diagnosis present

## 2021-06-15 DIAGNOSIS — I130 Hypertensive heart and chronic kidney disease with heart failure and stage 1 through stage 4 chronic kidney disease, or unspecified chronic kidney disease: Secondary | ICD-10-CM | POA: Diagnosis not present

## 2021-06-15 DIAGNOSIS — K259 Gastric ulcer, unspecified as acute or chronic, without hemorrhage or perforation: Secondary | ICD-10-CM | POA: Diagnosis not present

## 2021-06-15 DIAGNOSIS — R058 Other specified cough: Secondary | ICD-10-CM | POA: Diagnosis not present

## 2021-06-15 DIAGNOSIS — D509 Iron deficiency anemia, unspecified: Secondary | ICD-10-CM | POA: Diagnosis not present

## 2021-06-15 DIAGNOSIS — I509 Heart failure, unspecified: Secondary | ICD-10-CM | POA: Diagnosis not present

## 2021-06-15 DIAGNOSIS — Z8249 Family history of ischemic heart disease and other diseases of the circulatory system: Secondary | ICD-10-CM

## 2021-06-15 DIAGNOSIS — Z87891 Personal history of nicotine dependence: Secondary | ICD-10-CM

## 2021-06-15 DIAGNOSIS — M160 Bilateral primary osteoarthritis of hip: Secondary | ICD-10-CM | POA: Diagnosis present

## 2021-06-15 DIAGNOSIS — K2950 Unspecified chronic gastritis without bleeding: Secondary | ICD-10-CM | POA: Diagnosis not present

## 2021-06-15 HISTORY — DX: Paroxysmal atrial fibrillation: I48.0

## 2021-06-15 HISTORY — DX: Acute kidney failure, unspecified: N17.9

## 2021-06-15 LAB — IRON AND TIBC
Iron: 15 ug/dL — ABNORMAL LOW (ref 28–170)
Saturation Ratios: 3 % — ABNORMAL LOW (ref 10.4–31.8)
TIBC: 536 ug/dL — ABNORMAL HIGH (ref 250–450)
UIBC: 521 ug/dL

## 2021-06-15 LAB — RETICULOCYTES
Immature Retic Fract: 27.7 % — ABNORMAL HIGH (ref 2.3–15.9)
RBC.: 2.98 MIL/uL — ABNORMAL LOW (ref 3.87–5.11)
Retic Count, Absolute: 65 10*3/uL (ref 19.0–186.0)
Retic Ct Pct: 2.2 % (ref 0.4–3.1)

## 2021-06-15 LAB — BASIC METABOLIC PANEL
Anion gap: 11 (ref 5–15)
BUN: 52 mg/dL — ABNORMAL HIGH (ref 8–23)
CO2: 33 mmol/L — ABNORMAL HIGH (ref 22–32)
Calcium: 8.7 mg/dL — ABNORMAL LOW (ref 8.9–10.3)
Chloride: 93 mmol/L — ABNORMAL LOW (ref 98–111)
Creatinine, Ser: 2 mg/dL — ABNORMAL HIGH (ref 0.44–1.00)
GFR, Estimated: 24 mL/min — ABNORMAL LOW (ref >60)
Glucose, Bld: 98 mg/dL (ref 70–99)
Potassium: 4.2 mmol/L (ref 3.5–5.1)
Sodium: 137 mmol/L (ref 135–145)

## 2021-06-15 LAB — CBC WITH DIFFERENTIAL/PLATELET
Abs Immature Granulocytes: 0.04 10*3/uL (ref 0.00–0.07)
Basophils Absolute: 0.1 10*3/uL (ref 0.0–0.1)
Basophils Relative: 1 %
Eosinophils Absolute: 0.2 10*3/uL (ref 0.0–0.5)
Eosinophils Relative: 3 %
HCT: 24.8 % — ABNORMAL LOW (ref 36.0–46.0)
Hemoglobin: 7.4 g/dL — ABNORMAL LOW (ref 12.0–15.0)
Immature Granulocytes: 1 %
Lymphocytes Relative: 11 %
Lymphs Abs: 0.8 10*3/uL (ref 0.7–4.0)
MCH: 26.4 pg (ref 26.0–34.0)
MCHC: 29.8 g/dL — ABNORMAL LOW (ref 30.0–36.0)
MCV: 88.6 fL (ref 80.0–100.0)
Monocytes Absolute: 0.9 10*3/uL (ref 0.1–1.0)
Monocytes Relative: 12 %
Neutro Abs: 5.3 10*3/uL (ref 1.7–7.7)
Neutrophils Relative %: 72 %
Platelets: 392 10*3/uL (ref 150–400)
RBC: 2.8 MIL/uL — ABNORMAL LOW (ref 3.87–5.11)
RDW: 16.7 % — ABNORMAL HIGH (ref 11.5–15.5)
WBC: 7.3 10*3/uL (ref 4.0–10.5)
nRBC: 0 % (ref 0.0–0.2)

## 2021-06-15 LAB — FERRITIN: Ferritin: 13 ng/mL (ref 11–307)

## 2021-06-15 LAB — RESP PANEL BY RT-PCR (FLU A&B, COVID) ARPGX2
Influenza A by PCR: NEGATIVE
Influenza B by PCR: NEGATIVE
SARS Coronavirus 2 by RT PCR: NEGATIVE

## 2021-06-15 LAB — PREPARE RBC (CROSSMATCH)

## 2021-06-15 LAB — POC OCCULT BLOOD, ED: Fecal Occult Bld: NEGATIVE

## 2021-06-15 LAB — VITAMIN B12: Vitamin B-12: 300 pg/mL (ref 180–914)

## 2021-06-15 LAB — FOLATE: Folate: 10.6 ng/mL (ref >5.9)

## 2021-06-15 LAB — BRAIN NATRIURETIC PEPTIDE: B Natriuretic Peptide: 344.1 pg/mL — ABNORMAL HIGH (ref 0.0–100.0)

## 2021-06-15 LAB — D-DIMER, QUANTITATIVE: D-Dimer, Quant: 0.64 ug/mL-FEU — ABNORMAL HIGH (ref 0.00–0.50)

## 2021-06-15 MED ORDER — SODIUM CHLORIDE 0.9 % IV SOLN: INTRAVENOUS | Status: DC

## 2021-06-15 MED ORDER — LATANOPROST 0.005 % OP SOLN
1.0000 [drp] | Freq: Every day | OPHTHALMIC | Status: DC
  Administered 2021-06-16 – 2021-06-20 (×5): 1 [drp] via OPHTHALMIC
  Filled 2021-06-15: qty 2.5

## 2021-06-15 MED ORDER — SODIUM CHLORIDE 0.9% IV SOLUTION: Freq: Once | INTRAVENOUS | Status: AC

## 2021-06-15 MED ORDER — ACETAMINOPHEN 325 MG PO TABS: 650.0000 mg | ORAL_TABLET | Freq: Four times a day (QID) | ORAL | Status: DC | PRN

## 2021-06-15 MED ORDER — PREDNISONE 20 MG PO TABS: 40.0000 mg | ORAL_TABLET | Freq: Once | ORAL | Status: DC

## 2021-06-15 MED ORDER — HYDROCODONE-ACETAMINOPHEN 7.5-325 MG PO TABS
1.0000 | ORAL_TABLET | Freq: Four times a day (QID) | ORAL | Status: DC | PRN
  Administered 2021-06-16 – 2021-06-21 (×15): 1 via ORAL
  Filled 2021-06-15 (×15): qty 1

## 2021-06-15 MED ORDER — LEVOTHYROXINE SODIUM 25 MCG PO TABS
125.0000 ug | ORAL_TABLET | Freq: Every day | ORAL | Status: DC
  Administered 2021-06-16 – 2021-06-21 (×6): 125 ug via ORAL
  Filled 2021-06-15 (×6): qty 1

## 2021-06-15 MED ORDER — ROPINIROLE HCL 1 MG PO TABS: 1.0000 mg | ORAL_TABLET | Freq: Every day | ORAL | Status: DC

## 2021-06-15 MED ORDER — ROPINIROLE HCL 1 MG PO TABS
1.0000 mg | ORAL_TABLET | Freq: Every evening | ORAL | Status: DC | PRN
  Administered 2021-06-16 – 2021-06-18 (×5): 1 mg via ORAL
  Filled 2021-06-15 (×5): qty 1

## 2021-06-15 MED ORDER — IPRATROPIUM-ALBUTEROL 0.5-2.5 (3) MG/3ML IN SOLN
3.0000 mL | Freq: Once | RESPIRATORY_TRACT | Status: AC
  Administered 2021-06-15: 3 mL via RESPIRATORY_TRACT
  Filled 2021-06-15: qty 3

## 2021-06-15 MED ORDER — ONDANSETRON HCL 4 MG/2ML IJ SOLN
4.0000 mg | Freq: Four times a day (QID) | INTRAMUSCULAR | Status: DC | PRN
Start: 2021-06-15 — End: 2021-06-21

## 2021-06-15 MED ORDER — ONDANSETRON HCL 4 MG PO TABS: 4.0000 mg | ORAL_TABLET | Freq: Four times a day (QID) | ORAL | Status: DC | PRN

## 2021-06-15 MED ORDER — CARVEDILOL 12.5 MG PO TABS
12.5000 mg | ORAL_TABLET | Freq: Two times a day (BID) | ORAL | Status: DC
Start: 2021-06-15 — End: 2021-06-18
  Administered 2021-06-16 – 2021-06-17 (×5): 12.5 mg via ORAL
  Filled 2021-06-15 (×6): qty 1

## 2021-06-15 MED ORDER — ACETAMINOPHEN 650 MG RE SUPP: 650.0000 mg | Freq: Four times a day (QID) | RECTAL | Status: DC | PRN

## 2021-06-15 MED ORDER — APIXABAN 2.5 MG PO TABS
2.5000 mg | ORAL_TABLET | Freq: Two times a day (BID) | ORAL | Status: DC
Start: 2021-06-15 — End: 2021-06-16
  Administered 2021-06-16: 2.5 mg via ORAL
  Filled 2021-06-15 (×2): qty 1

## 2021-06-15 MED ORDER — ROPINIROLE HCL 1 MG PO TABS
1.0000 mg | ORAL_TABLET | ORAL | Status: AC
  Administered 2021-06-15: 1 mg via ORAL
  Filled 2021-06-15: qty 1

## 2021-06-15 MED ORDER — SODIUM CHLORIDE 0.9% FLUSH
3.0000 mL | Freq: Two times a day (BID) | INTRAVENOUS | Status: DC
  Administered 2021-06-16 – 2021-06-21 (×9): 3 mL via INTRAVENOUS

## 2021-06-15 MED ORDER — SENNOSIDES-DOCUSATE SODIUM 8.6-50 MG PO TABS: 1.0000 | ORAL_TABLET | Freq: Every evening | ORAL | Status: DC | PRN

## 2021-06-15 MED ORDER — ALBUTEROL SULFATE (2.5 MG/3ML) 0.083% IN NEBU
2.5000 mg | INHALATION_SOLUTION | RESPIRATORY_TRACT | Status: DC | PRN
  Administered 2021-06-18: 2.5 mg via RESPIRATORY_TRACT
  Filled 2021-06-15 (×2): qty 3

## 2021-06-15 MED ORDER — AMIODARONE HCL 200 MG PO TABS
200.0000 mg | ORAL_TABLET | Freq: Every day | ORAL | Status: DC
  Administered 2021-06-16 – 2021-06-21 (×6): 200 mg via ORAL
  Filled 2021-06-15 (×6): qty 1

## 2021-06-15 MED ORDER — METHYLPREDNISOLONE SODIUM SUCC 125 MG IJ SOLR
125.0000 mg | INTRAMUSCULAR | Status: AC
  Administered 2021-06-15: 125 mg via INTRAVENOUS
  Filled 2021-06-15: qty 2

## 2021-06-15 NOTE — ED Triage Notes (Addendum)
Patient reports a history of CHF. Patient states she was prescribed an increased dose of diuretics x 5 days, SOB increased. Patient is not on home O2. ? ?Patient states she saw her PCP today and was referred to the ED for further evaluation. ? ?Patient placed on O2 2L/min via [place]. Sats when not talking 93-95% and with movement or talking sats drop to 86-90%. ?

## 2021-06-15 NOTE — Assessment & Plan Note (Signed)
Last EF 55-60% by TTE 04/20/2021.  As above, suspect overdiuresis leading to AKI. ?-Holding diuretics and started on gentle IV fluid hydration as above ?-Monitor strict I/O's and daily weights ?

## 2021-06-15 NOTE — Assessment & Plan Note (Signed)
Stable with controlled rate and rhythm on admission. ?-Continue amiodarone, Coreg ?-Continue Eliquis, hold if any evidence of active bleeding ?

## 2021-06-15 NOTE — Assessment & Plan Note (Signed)
Continue Synthroid °

## 2021-06-15 NOTE — Assessment & Plan Note (Signed)
History of OSA intolerant to CPAP.  Has not been wearing oxygen at night regularly. ?-Continue supplemental oxygen nightly and as needed during the day ?

## 2021-06-15 NOTE — Assessment & Plan Note (Signed)
Hemoglobin 7.4 compared to usual baseline between 9-10.  She reports adherence to Eliquis and has not had any obvious bleeding.  FOBT was negative.  Anemia panel consistent with iron deficiency. ?-Transfuse 1 unit PRBC ?-Consider IV iron prior to discharge ?

## 2021-06-15 NOTE — H&P (Signed)
History and Physical    Lisa Mcclure:[id number] DOB: 06/22/1932 DOA: 06/15/2021  PCP: [name], MD  Patient coming from: Home  I have personally briefly reviewed patient's old medical records in [place]  Chief Complaint: Shortness of breath  HPI: Lisa Mcclure is a 88 y.o. female with medical history significant for HFpEF (EF 55-60% 04/20/2021), paroxysmal atrial fibrillation on Eliquis, CKD stage IIIb, HTN, hypothyroidism, small airway disease, anemia, venous insufficiency, AAA s/p endovascular stent graft (12/2019), and OSA intolerant to CPAP using supplemental O2 via [place] qhs who presented to the ED for evaluation of dyspnea.  Patient reports several weeks of exertional dyspnea, fatigue, and generalized weakness.  She has been less mobile than usual due to issues with her chronic osteoarthritis resulting in hip pain as well as stating that she pulled her groin recently.  She is seen in her cardiologist office on 06/09/2021 due to concern that she was developing volume overload due to her shortness of breath and 6 pound weight gain.  She was advised to increase her Lasix from 20 mg daily to twice daily.  Patient states that she has actually been taking 2 tablets twice a day (40 mg twice daily).    She states that she has had significantly increased urine output since increasing her Lasix.  Weight and lower extremity edema has gone down.  She has been feeling tired and somewhat dehydrated.  She is still persistently short of breath worsening over the last few days.  She has associated cough productive of clear sputum.  She reports sensation of postnasal drip.  She denies subjective fevers, chills, diaphoresis.  She states that she always sleeps with her head elevated due to her chronic back issues.  She does report a history of sleep apnea with intolerance to CPAP.  She says she was previously wearing oxygen at night but has not been doing so until the last few days  when her symptoms worsened.  She does take Eliquis regularly but denies any obvious bleeding such as hemoptysis, hematemesis, epistaxis, hematuria, hematochezia, or melena.  She reports a remote history of GI bleeding at which time she required blood transfusion.  She has remote history of lung scarring and BOOP previously evaluated by pulmonology and treated with oral antibiotics.  There was concern for interstitial lung disease however after further evaluation she was felt to have small airway disease without obstructive lung disease.  ED Course   Labs/Imaging on admission: I have personally reviewed following labs and imaging studies.  Initial vitals showed BP 108/82, pulse 58, RR 18, temp 98.3 F, SPO2 93%.  SPO2 dropped to 86-90% while on 2 L O2 via [place] per ED triage documentation.  Labs show WBC 7.3, hemoglobin 7.4, platelets 392,000, sodium 137, potassium 4.2, bicarb 33, BUN 52, creatinine 2.00 (baseline variable but appears to be 1.2-1.5), serum glucose 98, BNP 344.1, D-dimer 0.64 (0.43 when age-adjusted).  SARS-CoV-2 and influenza PCR negative.  FOBT is negative.  2 view chest x-ray shows minimal bibasilar subsegmental atelectasis and mild cardiomegaly.  Patient was given IV Solu-Medrol 125 mg, DuoNeb treatment.  The hospitalist service was consulted to admit for further evaluation and management.  Review of Systems: All systems reviewed and are negative except as documented in history of present illness above.   Past Medical History:  Diagnosis Date   Anemia    h/o hemorrhoidal bleeding and blood transfusion   Cardiomegaly    Chronic diastolic CHF (congestive heart failure) ([place])  Depression    Diverticulosis    DOE (dyspnea on exertion)    Esophageal reflux    GERD (gastroesophageal reflux disease)    Hypothyroidism    Insomnia    LBP (low back pain)    OAB (overactive bladder)    Obesity    OSA (obstructive sleep apnea)    Osteoarthritis    Paroxysmal atrial  fibrillation (HCC)    Rheumatoid arthritis(714.0)    Shoulder pain, bilateral    Unspecified essential hypertension    Venous insufficiency     Past Surgical History:  Procedure Laterality Date   ABDOMINAL AORTIC ENDOVASCULAR STENT GRAFT N/A 12/20/2019   Procedure: Aortogram including catheter selection of aorta and bilateral iliac arteriogram, Endovascular repair of infrarenal abdominal aortic aneurysm with bifurcated stent graft (26 mm x 14 x 12 main body, right bell bottom with a 20 mm x 10 cm piece, and left bell bottom with a 16 mm x 12 cm piece) ;  Surgeon: Marty Heck, MD;  Location: [facility];  Service: Vascular;  Laterality: N/A;   APPENDECTOMY  1953   bladder abduction-1996  1996   breast biopsy Right 1980   CARDIOVERSION N/A 12/24/2019   Procedure: CARDIOVERSION;  Surgeon: Adrian Prows, MD;  Location: [facility] ENDOSCOPY;  Service: Cardiovascular;  Laterality: N/A;   [place] N/A 04/10/2015   Procedure: FLEXIBLE SIGMOIDOSCOPY;  Surgeon: Arta Silence, MD;  Location: [facility] ENDOSCOPY;  Service: Endoscopy;  Laterality: N/A;   knee arthroscopy Right 1996, 2010   KNEE ARTHROSCOPY W/ AUTOGENOUS CARTILAGE IMPLANTATION (ACI) PROCEDURE Left 1994, 1995   REFRACTIVE SURGERY  01/2020   SHOULDER SURGERY  1990   TEE WITHOUT CARDIOVERSION N/A 12/24/2019   Procedure: TRANSESOPHAGEAL ECHOCARDIOGRAM (TEE);  Surgeon: Adrian Prows, MD;  Location: [facility];  Service: Cardiovascular;  Laterality: N/A;   [place] Bilateral 12/20/2019   Procedure: Ultrasound-guided access of bilateral common femoral arteries for delivery of endograft and percutaneous closure;  Surgeon: Marty Heck, MD;  Location: [place];  Service: Vascular;  Laterality: Bilateral;   VESICOVAGINAL FISTULA CLOSURE W/ TAH     WRIST SURGERY  1967    Social History:   reports that she quit smoking about 37 years ago. Her smoking use included cigarettes. She has a 2.00 pack-year smoking history. She has never used smokeless tobacco. She reports current alcohol use. She reports that she does not use drugs.  Allergies  Allergen Reactions   Statins Other (See Comments)    Muscle aches and INTERNAL BLEEDING   Atorvastatin     Other reaction(s): Myalgias   Gabapentin Other (See Comments)    "Made me loopy"   Methocarbamol Other (See Comments)    "Made me loopy"    Family History  Problem Relation Age of Onset   COPD Father    Lung cancer Mother    Heart attack Maternal Grandmother 66   Breast cancer Maternal Aunt    Lung cancer Son      Prior to Admission medications   Medication Sig Start Date End Date Taking? Authorizing Provider  albuterol (VENTOLIN HFA) 108 (90 Base) MCG/ACT inhaler Inhale 2 puffs into the lungs every 6 (six) hours as needed for wheezing or shortness of breath. 06/10/21   Cantwell, [name] C, PA-C  amiodarone (PACERONE) 100 MG tablet Take 2 tablets (  200 mg total) by mouth daily. 04/16/21   Cantwell, [name] C, PA-C  amLODipine (NORVASC) 10 MG tablet TAKE ONE TABLET BY MOUTH AT NOON 11/09/20   Cantwell, [name] C, PA-C  apixaban (ELIQUIS) 2.5 MG TABS tablet Take 1 tablet (2.5 mg total) by mouth 2 (two) times daily. 02/16/21   Cantwell, [name] C, PA-C  Bempedoic Acid-Ezetimibe (NEXLIZET) 180-10 MG TABS Take 1 capsule by mouth daily. Patient taking differently: Take 1 capsule by mouth daily. Taking it at bedtime 02/11/19   Adrian Prows, MD  carvedilol (COREG) 12.5 MG tablet TAKE ONE TABLET BY MOUTH AT [facility] AND AT BEDTIME 10/13/20   Cantwell, [name] C, PA-C  Cholecalciferol (VITAMIN D3) 125 MCG (5000 UT) CAPS Take 5,000 Units by mouth daily with breakfast. Taking it at lunchtime    [provider]  diclofenac sodium (VOLTAREN) 1 % GEL Apply 2.25 g topically 3 (three) times daily as needed (for pain).  10/23/18   [provider]  furosemide (LASIX) 20 MG tablet Take 1 tablet (20 mg total) by mouth daily as needed for fluid or edema. Take Lasix 20 mg once daily with an additional dose as needed for fluid overload. 11/16/20   Cantwell, [name] C, PA-C  HYDROcodone-acetaminophen (NORCO) 7.5-325 MG tablet Take 1 tablet by mouth every 6 (six) hours as needed for moderate pain. 04/13/15   Barton Dubois, MD  isosorbide-hydrALAZINE (BIDIL) 20-37.5 MG tablet Take 1 tablet by mouth 3 (three) times daily.    [provider]  latanoprost (XALATAN) 0.005 % ophthalmic solution Place 1 drop into both eyes at bedtime.    [provider]  levothyroxine (SYNTHROID) 125 MCG tablet Take 125 mcg by mouth daily before breakfast.     [provider]  losartan (COZAAR) 100 MG tablet TAKE ONE TABLET BY MOUTH AT NOON 06/03/21   Cantwell, [name] C, PA-C  Multiple Vitamins-Minerals (ICAPS AREDS 2 PO) Take by mouth.    [provider]  omeprazole (PRILOSEC) 20 MG capsule Take 1 capsule (20 mg total) by mouth daily. 02/11/20   Cantwell, [name] C, PA-C  rOPINIRole (REQUIP) 1 MG tablet Take 1 mg by mouth See admin instructions. Take 1 mg by mouth once a day between 3 PM-4 PM and an additional 1 mg in the evening, if no relief 06/19/15   [provider]  spironolactone (ALDACTONE) 25 MG tablet TAKE 1/2 TABLET BY MOUTH ONCE DAILY 03/08/21   Alethia Berthold, [place]    Physical Exam: Vitals:   06/15/21 1930 06/15/21 2007 06/15/21 2100 06/15/21 2200  BP: (!) 128/105 111/69 (!) 109/52 (!) 115/103  Pulse: 73 69 62 (!) 59  Resp: '14 16 16 18  '$ Temp:      SpO2: 94% 94% 97% 97%  Weight:      Height:       Constitutional: Elderly woman sitting up resting on the side of the bed, NAD, calm, comfortable Eyes: PERRL, lids and conjunctivae normal ENMT: Mucous membranes are dry. Posterior pharynx clear of any exudate or lesions.Normal dentition.  Neck: normal, supple, no masses. Respiratory: clear to  auscultation bilaterally, no wheezing, no crackles. Normal respiratory effort. No accessory muscle use.  Cardiovascular: Regular rate and rhythm, loud systolic murmur.  Trace bilateral lower extremity edema. 2+ pedal pulses. Abdomen: no tenderness, no masses palpated.  Musculoskeletal: no clubbing / cyanosis.  Osteoarthritic changes of both hands. Normal muscle tone.  Skin: no rashes, lesions, ulcers. No induration Neurologic: CN 2-12 grossly intact. Sensation intact. Strength equal bilaterally.  Psychiatric: Normal judgment and insight. Alert and oriented x 3. Normal mood.   EKG: Ordered and pending.  Assessment/Plan Principal Problem:   Acute respiratory failure with hypoxia (HCC) Active Problems:   Symptomatic anemia   Acute renal failure superimposed on stage 3b chronic kidney disease (HCC)   Chronic heart failure with preserved ejection fraction (HFpEF) (HCC)   Paroxysmal atrial fibrillation (HCC)   OSA (obstructive sleep apnea)   Hypertension   Hypothyroidism   RLS (restless legs syndrome)   Generalized weakness   PIEPER KASIK is a 88 y.o. female with medical history significant for HFpEF (EF 55-60% 04/20/2021), paroxysmal atrial fibrillation on Eliquis, CKD stage IIIb, HTN, hypothyroidism, small airway disease, anemia, venous insufficiency, AAA s/p endovascular stent graft (12/2019), and OSA intolerant to CPAP using supplemental O2 via [place] qhs who is admitted with acute respiratory failure with hypoxia, AKI on CKD stage IIIb, and generalized weakness.  Assessment and Plan: * Acute respiratory failure with hypoxia (HCC) Patient with exertional dyspnea with SPO2 as low as 86% while on 2 L O2 via [place].  Likely multifactorial related to symptomatic anemia, atelectasis, sleep apnea, obesity, and deconditioning.  CXR without evidence of pneumonia or pulmonary edema. -Continue supplemental oxygen as needed and scheduled nightly -Start incentive spirometer, flutter valve, albuterol as  needed -Transfuse 1 unit PRBC -Mobilize with PT/OT  Symptomatic anemia Hemoglobin 7.4 compared to usual baseline between 9-10.  She reports adherence to Eliquis and has not had any obvious bleeding.  FOBT was negative.  Anemia panel consistent with iron deficiency. -Transfuse 1 unit PRBC -Consider IV iron prior to discharge  Acute renal failure superimposed on stage 3b chronic kidney disease ([place]) Creatinine 2.00 on admission.  Baseline labs show variable creatinine, usually between 1.2-1.5.  AKI likely related to acute on chronic anemia and increased diuretics last 5 days in addition to losartan use. -1 unit PRBC transfusion as above -Hold Lasix, losartan -Start gentle IV fluid hydration with NS'@75'$  mL/hour overnight -Repeat labs in a.m.  Chronic heart failure with preserved ejection fraction (HFpEF) ([place]) Last EF 55-60% by TTE 04/20/2021.  As above, suspect overdiuresis leading to AKI. -Holding diuretics and started on gentle IV fluid hydration as above -Monitor strict I/O's and daily weights  Paroxysmal atrial fibrillation (HCC) Stable with controlled rate and rhythm on admission. -Continue amiodarone, Coreg -Continue Eliquis, hold if any evidence of active bleeding  Generalized weakness Secondary to symptomatic anemia and chronic osteoarthritis with deconditioning. -Request PT/OT eval  RLS (restless legs syndrome) Continue Requip.  Hypothyroidism Continue Synthroid.  Hypertension Blood pressure on the lower side on admission. -Continue Coreg -Hold amlodipine, Bidil, losartan, Lasix  OSA (obstructive sleep apnea) History of OSA intolerant to CPAP.  Has not been wearing oxygen at night regularly. -Continue supplemental oxygen nightly and as needed during the day  DVT prophylaxis: Eliquis Code Status: DNR, confirmed with patient on admission. Family Communication: Discussed with patient's daughter at bedside. Disposition Plan: From home, dispo pending clinical  progress. Consults called: None Severity of Illness: The appropriate patient status for this patient is OBSERVATION. Observation status is judged to be reasonable and necessary in order to provide the required intensity of service to ensure the patient's safety. The patient's presenting symptoms, physical exam findings, and initial radiographic and laboratory data in the context of their medical condition is felt to place them at decreased risk for further clinical deterioration. Furthermore, it is anticipated that the patient will be medically stable for discharge from the hospital within 2 midnights  of admission.   Zada Finders MD [facility]  If 7PM-7AM, please contact night-coverage [email or web address]  06/15/2021, 11:06 PM

## 2021-06-15 NOTE — Assessment & Plan Note (Signed)
Secondary to symptomatic anemia and chronic osteoarthritis with deconditioning. ?-Request PT/OT eval ?

## 2021-06-15 NOTE — Assessment & Plan Note (Addendum)
Blood pressure on the lower side on admission. ?-Continue Coreg ?-Hold amlodipine, Bidil, losartan, Lasix ?

## 2021-06-15 NOTE — ED Notes (Signed)
Pt given ham sandwich and ginger ale 

## 2021-06-15 NOTE — Assessment & Plan Note (Signed)
Patient with exertional dyspnea with SPO2 as low as 86% while on 2 L O2 via [place].  Likely multifactorial related to symptomatic anemia, atelectasis, sleep apnea, obesity, and deconditioning.  CXR without evidence of pneumonia or pulmonary edema. ?-Continue supplemental oxygen as needed and scheduled nightly ?-Start incentive spirometer, flutter valve, albuterol as needed ?-Transfuse 1 unit PRBC ?-Mobilize with PT/OT ?

## 2021-06-15 NOTE — Assessment & Plan Note (Signed)
Continue Requip. 

## 2021-06-15 NOTE — ED Provider Notes (Addendum)
[facility] DEPT Provider Note   CSN: [id number] Arrival date & time: 06/15/21  1607     History  Chief Complaint  Patient presents with   Shortness of Breath    Lisa Mcclure is a 88 y.o. female with history of diastolic CHF, diverticulosis, sleep apnea, COPD, CKD. Patient presents to ED for evaluation of shortness of breath.  Patient states that for the last 6 weeks she has had increased shortness of breath that acutely worsened in the last 3 days.  Patient reports that she was seen by her cardiologist 5 days ago and placed on 2 times the amount of diuretic that she typically takes, going from '20mg'$  lasix once daily to twice daily.  Patient reports that after taking 2x dosage of Lasix her shortness of breath did not resolve, it actually worsened.  Patient called her cardiologist office today who advised her to report to ED for evaluation.  Patient states that she is supposed to be on nighttime oxygen however she denies being compliant on this.  Patient is endorsing shortness of breath, leg swelling, cough.  The patient denies chest pain, nausea, vomiting, diarrhea, fevers, abdominal pain, wheezing.   Shortness of Breath Associated symptoms: cough   Associated symptoms: no abdominal pain, no chest pain, no fever, no vomiting and no wheezing       Home Medications Prior to Admission medications   Medication Sig Start Date End Date Taking? Authorizing Provider  albuterol (VENTOLIN HFA) 108 (90 Base) MCG/ACT inhaler Inhale 2 puffs into the lungs every 6 (six) hours as needed for wheezing or shortness of breath. 06/10/21   Cantwell, [name] C, PA-C  amiodarone (PACERONE) 100 MG tablet Take 2 tablets (200 mg total) by mouth daily. 04/16/21   Cantwell, [name] C, PA-C  amLODipine (NORVASC) 10 MG tablet TAKE ONE TABLET BY MOUTH AT NOON 11/09/20   Cantwell, [name] C, PA-C  apixaban (ELIQUIS) 2.5 MG TABS tablet Take 1 tablet (2.5 mg total) by mouth 2 (two) times  daily. 02/16/21   Cantwell, [name] C, PA-C  Bempedoic Acid-Ezetimibe (NEXLIZET) 180-10 MG TABS Take 1 capsule by mouth daily. Patient taking differently: Take 1 capsule by mouth daily. Taking it at bedtime 02/11/19   Adrian Prows, MD  carvedilol (COREG) 12.5 MG tablet TAKE ONE TABLET BY MOUTH AT [facility] AND AT BEDTIME 10/13/20   Cantwell, [name] C, PA-C  Cholecalciferol (VITAMIN D3) 125 MCG (5000 UT) CAPS Take 5,000 Units by mouth daily with breakfast. Taking it at lunchtime    [provider]  diclofenac sodium (VOLTAREN) 1 % GEL Apply 2.25 g topically 3 (three) times daily as needed (for pain).  10/23/18   [provider]  furosemide (LASIX) 20 MG tablet Take 1 tablet (20 mg total) by mouth daily as needed for fluid or edema. Take Lasix 20 mg once daily with an additional dose as needed for fluid overload. 11/16/20   Cantwell, [name] C, PA-C  HYDROcodone-acetaminophen (NORCO) 7.5-325 MG tablet Take 1 tablet by mouth every 6 (six) hours as needed for moderate pain. 04/13/15   Barton Dubois, MD  isosorbide-hydrALAZINE (BIDIL) 20-37.5 MG tablet Take 1 tablet by mouth 3 (three) times daily.    [provider]  latanoprost (XALATAN) 0.005 % ophthalmic solution Place 1 drop into both eyes at bedtime.    [provider]  levothyroxine (SYNTHROID) 125 MCG tablet Take 125 mcg by mouth daily before breakfast.     [provider]  losartan (COZAAR) 100 MG tablet  TAKE ONE TABLET BY MOUTH AT NOON 06/03/21   Cantwell, [name] C, PA-C  Multiple Vitamins-Minerals (ICAPS AREDS 2 PO) Take by mouth.    [provider]  omeprazole (PRILOSEC) 20 MG capsule Take 1 capsule (20 mg total) by mouth daily. 02/11/20   Cantwell, [name] C, PA-C  rOPINIRole (REQUIP) 1 MG tablet Take 1 mg by mouth See admin instructions. Take 1 mg by mouth once a day between 3 PM-4 PM and an additional 1 mg in the evening, if no relief 06/19/15   [provider]  spironolactone (ALDACTONE) 25  MG tablet TAKE 1/2 TABLET BY MOUTH ONCE DAILY 03/08/21   Cantwell, [name] C, PA-C      Allergies    Statins, Atorvastatin, Gabapentin, and Methocarbamol    Review of Systems   Review of Systems  Constitutional:  Negative for fever.  Respiratory:  Positive for cough and shortness of breath. Negative for wheezing.   Cardiovascular:  Positive for leg swelling. Negative for chest pain.  Gastrointestinal:  Negative for abdominal pain, diarrhea, nausea and vomiting.  All other systems reviewed and are negative.  Physical Exam Updated Vital Signs BP 111/69    Pulse 69    Temp 98.3 F (36.8 C)    Resp 16    Ht '5\' 3"'$  (1.6 m)    Wt 95.3 kg    SpO2 94%    BMI 37.20 kg/m  Physical Exam Vitals and nursing note reviewed.  Constitutional:      General: She is not in acute distress.    Appearance: She is well-developed. She is not ill-appearing, toxic-appearing or diaphoretic.  HENT:     Head: Normocephalic and atraumatic.  Eyes:     Extraocular Movements: Extraocular movements intact.     Pupils: Pupils are equal, round, and reactive to light.  Neck:     Vascular: No JVD.  Cardiovascular:     Rate and Rhythm: Normal rate and regular rhythm.  Pulmonary:     Effort: Pulmonary effort is normal. No tachypnea, accessory muscle usage or respiratory distress.     Breath sounds: Examination of the right-middle field reveals wheezing. Examination of the left-middle field reveals wheezing. Examination of the right-lower field reveals wheezing. Examination of the left-lower field reveals wheezing. Decreased breath sounds and wheezing present. No rhonchi or rales.  Chest:     Chest wall: No mass, tenderness or edema.  Abdominal:     General: Bowel sounds are normal.     Palpations: Abdomen is soft. There is no mass.  Genitourinary:    Rectum: Guaiac result negative.  Musculoskeletal:     Cervical back: Normal range of motion and neck supple.     Right lower leg: 1+ Pitting Edema present.     Left  lower leg: 1+ Pitting Edema present.  Skin:    General: Skin is warm and dry.     Capillary Refill: Capillary refill takes less than 2 seconds.  Neurological:     Mental Status: She is alert.     GCS: GCS eye subscore is 4. GCS verbal subscore is 5. GCS motor subscore is 6.     Cranial Nerves: Cranial nerves 2-12 are intact. No dysarthria.     Sensory: Sensation is intact. No sensory deficit.     Motor: Motor function is intact. No weakness.     Coordination: Coordination is intact. Heel to [facility] Test normal.    ED Results / Procedures / Treatments   Labs (all labs ordered are  listed, but only abnormal results are displayed) Labs Reviewed  CBC WITH DIFFERENTIAL/PLATELET - Abnormal; Notable for the following components:      Result Value   RBC 2.80 (*)    Hemoglobin 7.4 (*)    HCT 24.8 (*)    MCHC 29.8 (*)    RDW 16.7 (*)    All other components within normal limits  BASIC METABOLIC PANEL - Abnormal; Notable for the following components:   Chloride 93 (*)    CO2 33 (*)    BUN 52 (*)    Creatinine, Ser 2.00 (*)    Calcium 8.7 (*)    GFR, Estimated 24 (*)    All other components within normal limits  BRAIN NATRIURETIC PEPTIDE - Abnormal; Notable for the following components:   B Natriuretic Peptide 344.1 (*)    All other components within normal limits  D-DIMER, QUANTITATIVE - Abnormal; Notable for the following components:   D-Dimer, Quant 0.64 (*)    All other components within normal limits  RESP PANEL BY RT-PCR (FLU A&B, COVID) ARPGX2  VITAMIN B12  FOLATE  IRON AND TIBC  FERRITIN  RETICULOCYTES  POC OCCULT BLOOD, ED    EKG None  Radiology DG Chest 2 View  Result Date: 06/15/2021 CLINICAL DATA:  Shortness of breath. EXAM: CHEST - 2 VIEW COMPARISON:  December 20, 2019. FINDINGS: Stable cardiomegaly. Minimal bibasilar subsegmental atelectasis is noted. Bony thorax is unremarkable. IMPRESSION: Minimal bibasilar subsegmental atelectasis.  Mild cardiomegaly.  Electronically Signed   By: Marijo Conception M.D.   On: 06/15/2021 17:20    Procedures Procedures    Medications Ordered in ED Medications  ipratropium-albuterol (DUONEB) 0.5-2.5 (3) MG/3ML nebulizer solution 3 mL (3 mLs Nebulization Given 06/15/21 1749)  methylPREDNISolone sodium succinate (SOLU-MEDROL) 125 mg/2 mL injection 125 mg (125 mg Intravenous Given 06/15/21 1805)  rOPINIRole (REQUIP) tablet 1 mg (1 mg Oral Given 06/15/21 1926)    ED Course/ Medical Decision Making/ A&P                           Medical Decision Making Amount and/or Complexity of Data Reviewed Labs: ordered. Radiology: ordered.  Risk Prescription drug management. Decision regarding hospitalization.   88 year old female presents ED for evaluation of shortness of breath.  Please see HPI for further details. On examination, the patient is afebrile, nontachycardic, decreased oxygen saturation to 92% on room air, soft depressible abdomen.  Patient has wheezing noted throughout lung fields, patient has COPD.  No rales noted.  Patient has 1+ pedal edema bilaterally.  Patient worked up utilizing following labs and imaging studies interpreted by me personally: - Fecal occult negative - Respiratory panel negative - BNP elevated at 344 - D-dimer is elevated at 0.64.  Age-adjusted D-dimer is 0.43, not elevated - BMP shows elevated creatinine of 2, elevated BUN of 52, decreased GFR to 24.  Patient has CHF, will defer to hospitalist for fluid replenishment. - CBC shows decrease hemoglobin to 7.4.  Patient states that this is a problem that is occurred in the past with her.  Patient most likely needs blood transfusion. - Patient chest x-ray does not show any signs of consolidation, effusion.  The patient's chest x-ray does show mild cardiomegaly  Patient treated with DuoNeb as well as Solu-Medrol 125 mg with no relief of shortness of breath.  At this time, patient most likely in need of admission for transfusion due to low  hemoglobin.  Hospitalist have been consulted,  Dr. Posey Pronto has agreed to admit the patient for further management.  Patient agreeable to plan.  Patient stable at this time.  Final Clinical Impression(s) / ED Diagnoses Final diagnoses:  Shortness of breath    Rx / DC Orders ED Discharge Orders     None             Lawana Chambers 06/15/21 2030    Dorie Rank, MD 06/17/21 [phone]

## 2021-06-15 NOTE — Hospital Course (Signed)
Lisa Mcclure is a 88 y.o. female with medical history significant for HFpEF (EF 55-60% 04/20/2021), paroxysmal atrial fibrillation on Eliquis, CKD stage IIIb, HTN, hypothyroidism, small airway disease, anemia, venous insufficiency, AAA s/p endovascular stent graft (12/2019), and OSA intolerant to CPAP using supplemental O2 via [place] [facility] who is admitted with acute respiratory failure with hypoxia, AKI on CKD stage IIIb, and generalized weakness. ?

## 2021-06-15 NOTE — Assessment & Plan Note (Addendum)
Creatinine 2.00 on admission.  Baseline labs show variable creatinine, usually between 1.2-1.5.  AKI likely related to acute on chronic anemia and increased diuretics last 5 days in addition to losartan use. ?-1 unit PRBC transfusion as above ?-Hold Lasix, losartan ?-Start gentle IV fluid hydration with NS'@75'$  mL/hour overnight ?-Repeat labs in a.m. ?

## 2021-06-15 NOTE — ED Notes (Signed)
ED TO INPATIENT HANDOFF REPORT  ED Nurse Name and Phone #:  Fabio Neighbors RN  S Name/Age/Gender Melvyn Novas [place]ton 88 y.o. female Room/Bed: WA15/WA15  Code Status   Code Status: DNR  Home/SNF/Other Home Patient oriented to: self, place, time, and situation Is this baseline? Yes   Triage Complete: Triage complete  Chief Complaint Acute respiratory failure with hypoxia [facility]) [J96.01]  Triage Note Patient reports a history of CHF. Patient states she was prescribed an increased dose of diuretics x 5 days, SOB increased. Patient is not on home O2.  Patient states she saw her PCP today and was referred to the ED for further evaluation.  Patient placed on O2 2L/min via [place]. Sats when not talking 93-95% and with movement or talking sats drop to 86-90%.   Allergies Allergies  Allergen Reactions   Statins Other (See Comments)    Muscle aches and INTERNAL BLEEDING   Atorvastatin     Other reaction(s): Myalgias   Gabapentin Other (See Comments)    "Made me loopy"   Methocarbamol Other (See Comments)    "Made me loopy"    Level of Care/Admitting Diagnosis ED Disposition     ED Disposition  Admit   Condition  --   [place]: [place] [[id number]]  Level of Care: Telemetry [5]  Admit to tele based on following criteria: Acute CHF  May place patient in observation at [facility] or [place] if equivalent level of care is available:: No  Covid Evaluation: Confirmed COVID Negative  Diagnosis: Acute respiratory failure with hypoxia [facility]) [[id number]]  Admitting Physician: Lenore Cordia [[id number]]  Attending Physician: Lenore Cordia [[id number]]          B Medical/Surgery History Past Medical History:  Diagnosis Date   Anemia    h/o hemorrhoidal bleeding and blood transfusion   Cardiomegaly    Chronic diastolic CHF (congestive heart failure) (HCC)    Depression    Diverticulosis    DOE (dyspnea on exertion)    Esophageal reflux     GERD (gastroesophageal reflux disease)    Hypothyroidism    Insomnia    LBP (low back pain)    OAB (overactive bladder)    Obesity    OSA (obstructive sleep apnea)    Osteoarthritis    Paroxysmal atrial fibrillation (HCC)    Rheumatoid arthritis(714.0)    Shoulder pain, bilateral    Unspecified essential hypertension    Venous insufficiency    Past Surgical History:  Procedure Laterality Date   ABDOMINAL AORTIC ENDOVASCULAR STENT GRAFT N/A 12/20/2019   Procedure: Aortogram including catheter selection of aorta and bilateral iliac arteriogram, Endovascular repair of infrarenal abdominal aortic aneurysm with bifurcated stent graft (26 mm x 14 x 12 main body, right bell bottom with a 20 mm x 10 cm piece, and left bell bottom with a 16 mm x 12 cm piece) ;  Surgeon: Marty Heck, MD;  Location: [place];  Service: Vascular;  Laterality: N/A;   APPENDECTOMY  1953   bladder abduction-1996  1996   breast biopsy Right 1980   CARDIOVERSION N/A 12/24/2019   Procedure: CARDIOVERSION;  Surgeon: Adrian Prows, MD;  Location[facility] ENDOSCOPY;  Service: Cardiovascular;  Laterality: N/A;   [place] N/A 04/10/2015   Procedure: FLEXIBLE SIGMOIDOSCOPY;  Surgeon: Arta Silence, MD;  Location: [place] ENDOSCOPY;  Service: Endoscopy;  Laterality: N/A;   knee arthroscopy Right 1996, 2010   KNEE ARTHROSCOPY W/ AUTOGENOUS CARTILAGE IMPLANTATION (ACI) PROCEDURE Left 1994, 1995   REFRACTIVE SURGERY  01/2020   SHOULDER SURGERY  1990   TEE WITHOUT CARDIOVERSION N/A 12/24/2019   Procedure: TRANSESOPHAGEAL ECHOCARDIOGRAM (TEE);  Surgeon: Adrian Prows, MD;  Location: [facility] ENDOSCOPY;  Service: Cardiovascular;  Laterality: N/A;   TOTAL ABDOMINAL [place] Bilateral 12/20/2019   Procedure: Ultrasound-guided access of bilateral common femoral arteries for delivery of endograft and  percutaneous closure;  Surgeon: Marty Heck, MD;  Location: [place];  Service: Vascular;  Laterality: Bilateral;   VESICOVAGINAL FISTULA CLOSURE W/ [place]     A IV Location/Drains/Wounds Patient Lines/Drains/Airways Status     Active Line/Drains/Airways     Name Placement date Placement time Site Days   Peripheral IV 06/15/21 20 G Anterior;Proximal;Right Forearm 06/15/21  1800  Forearm  less than 1   Airway 12/24/19  1413  -- 539   Incision (Closed) 12/20/19 Groin Left 12/20/19  2058  -- 543   Incision (Closed) 12/20/19 Groin Right 12/20/19  2058  -- 543            Intake/Output Last 24 hours No intake or output data in the 24 hours ending 06/15/21 2252  Labs/Imaging Results for orders placed or performed during the hospital encounter of 06/15/21 (from the past 48 hour(s))  CBC with Differential     Status: Abnormal   Collection Time: 06/15/21  5:12 PM  Result Value Ref Range   WBC 7.3 4.0 - 10.5 K/uL   RBC 2.80 (L) 3.87 - 5.11 MIL/uL   Hemoglobin 7.4 (L) 12.0 - 15.0 g/dL   HCT 24.8 (L) 36.0 - 46.0 %   MCV 88.6 80.0 - 100.0 fL   MCH 26.4 26.0 - 34.0 pg   MCHC 29.8 (L) 30.0 - 36.0 g/dL   RDW 16.7 (H) 11.5 - 15.5 %   Platelets 392 150 - 400 K/uL   nRBC 0.0 0.0 - 0.2 %   Neutrophils Relative % 72 %   Neutro Abs 5.3 1.7 - 7.7 K/uL   Lymphocytes Relative 11 %   Lymphs Abs 0.8 0.7 - 4.0 K/uL   Monocytes Relative 12 %   Monocytes Absolute 0.9 0.1 - 1.0 K/uL   Eosinophils Relative 3 %   Eosinophils Absolute 0.2 0.0 - 0.5 K/uL   Basophils Relative 1 %   Basophils Absolute 0.1 0.0 - 0.1 K/uL   Immature Granulocytes 1 %   Abs Immature Granulocytes 0.04 0.00 - 0.07 K/uL    Comment: Performed at [facility], [place] [address], [place], [place] [address]  Basic metabolic panel     Status: Abnormal   Collection Time: 06/15/21  5:12 PM  Result Value Ref Range   Sodium 137 135 - 145 mmol/L   Potassium 4.2 3.5 - 5.1 mmol/L    Chloride 93 (L) 98 - 111 mmol/L   CO2 33 (H) 22 - 32 mmol/L   Glucose, Bld 98 70 - 99 mg/dL    Comment: Glucose reference range applies only to samples taken after fasting for at least 8 hours.   BUN 52 (H) 8 - 23 mg/dL   Creatinine, Ser 2.00 (H) 0.44 - 1.00 mg/dL   Calcium 8.7 (L) 8.9 - 10.3 mg/dL   GFR, Estimated 24 (L) >60 mL/min    Comment: (NOTE)  Calculated using the CKD-EPI Creatinine Equation (2021)    Anion gap 11 5 - 15    Comment: Performed at [facility], [place] [address], [place], [place] [address]  Resp Panel by RT-PCR (Flu A&B, Covid) Nasopharyngeal Swab     Status: None   Collection Time: 06/15/21  5:16 PM   Specimen: Nasopharyngeal Swab; Nasopharyngeal(NP) swabs in vial transport medium  Result Value Ref Range   SARS Coronavirus 2 by RT PCR NEGATIVE NEGATIVE    Comment: (NOTE) SARS-CoV-2 target nucleic acids are NOT DETECTED.  The SARS-CoV-2 RNA is generally detectable in upper respiratory specimens during the acute phase of infection. The lowest concentration of SARS-CoV-2 viral copies this assay can detect is 138 copies/mL. A negative result does not preclude SARS-Cov-2 infection and should not be used as the sole basis for treatment or other patient management decisions. A negative result may occur with  improper specimen collection/handling, submission of specimen other than nasopharyngeal swab, presence of viral mutation(s) within the areas targeted by this assay, and inadequate number of viral copies(<138 copies/mL). A negative result must be combined with clinical observations, patient history, and epidemiological information. The expected result is Negative.  Fact Sheet for Patients:  EntrepreneurPulse.com.au  Fact Sheet for Healthcare Providers:  IncredibleEmployment.be  This test is no t yet approved or cleared by the [place] FDA and  has been authorized for detection and/or diagnosis of  SARS-CoV-2 by FDA under an Emergency Use Authorization (EUA). This EUA will remain  in effect (meaning this test can be used) for the duration of the COVID-19 declaration under Section 564(b)(1) of the Act, 21 U.S.C.section 360bbb-3(b)(1), unless the authorization is terminated  or revoked sooner.       Influenza A by PCR NEGATIVE NEGATIVE   Influenza B by PCR NEGATIVE NEGATIVE    Comment: (NOTE) The Xpert Xpress SARS-CoV-2/FLU/RSV plus assay is intended as an aid in the diagnosis of influenza from Nasopharyngeal swab specimens and should not be used as a sole basis for treatment. Nasal washings and aspirates are unacceptable for Xpert Xpress SARS-CoV-2/FLU/RSV testing.  Fact Sheet for Patients: EntrepreneurPulse.com.au  Fact Sheet for Healthcare Providers: IncredibleEmployment.be  This test is not yet approved or cleared by the [place] FDA and has been authorized for detection and/or diagnosis of SARS-CoV-2 by FDA under an Emergency Use Authorization (EUA). This EUA will remain in effect (meaning this test can be used) for the duration of the COVID-19 declaration under Section 564(b)(1) of the Act, 21 U.S.C. section 360bbb-3(b)(1), unless the authorization is terminated or revoked.  Performed at [facility], [place] [address] [place], [place] [address]   Brain natriuretic peptide     Status: Abnormal   Collection Time: 06/15/21  5:18 PM  Result Value Ref Range   B Natriuretic Peptide 344.1 (H) 0.0 - 100.0 pg/mL    Comment: Performed at [facility], [place] [address], [place], [place] [address]  D-dimer, quantitative     Status: Abnormal   Collection Time: 06/15/21  5:45 PM  Result Value Ref Range   D-Dimer, Quant 0.64 (H) 0.00 - 0.50 ug/mL-FEU    Comment: (NOTE) At the manufacturer cut-off value of 0.5 g/mL FEU, this assay has a negative predictive value of 95-100%.This assay is intended for  use in conjunction with a clinical pretest probability (PTP) assessment model to exclude pulmonary embolism (PE) and deep venous thrombosis (DVT) in outpatients suspected of PE or DVT. Results should be correlated with clinical  presentation. Performed at [facility], [place] [address] [place], [place] [address]   POC occult blood, ED Provider will collect     Status: None   Collection Time: 06/15/21  7:32 PM  Result Value Ref Range   Fecal Occult Bld NEGATIVE NEGATIVE  Vitamin B12     Status: None   Collection Time: 06/15/21  8:22 PM  Result Value Ref Range   Vitamin B-12 300 180 - 914 pg/mL    Comment: (NOTE) This assay is not validated for testing neonatal or myeloproliferative syndrome specimens for Vitamin B12 levels. Performed at [facility], [place] [address], [place], [place] [address]   Folate     Status: None   Collection Time: 06/15/21  8:22 PM  Result Value Ref Range   Folate 10.6 >5.9 ng/mL    Comment: Performed at [facility], [place] [address] [place], [place] [address]  Iron and TIBC     Status: Abnormal   Collection Time: 06/15/21  8:22 PM  Result Value Ref Range   Iron 15 (L) 28 - 170 ug/dL   TIBC 536 (H) 250 - 450 ug/dL   Saturation Ratios 3 (L) 10.4 - 31.8 %   UIBC 521 ug/dL    Comment: Performed at [facility], [place] [address], [place], [place] [address]  Ferritin     Status: None   Collection Time: 06/15/21  8:22 PM  Result Value Ref Range   Ferritin 13 11 - 307 ng/mL    Comment: Performed at [facility], [place] [address], [place], [place] [address]  Reticulocytes     Status: Abnormal   Collection Time: 06/15/21  8:22 PM  Result Value Ref Range   Retic Ct Pct 2.2 0.4 - 3.1 %   RBC. 2.98 (L) 3.87 - 5.11 MIL/uL   Retic Count, Absolute 65.0 19.0 - 186.0 K/uL   Immature Retic Fract 27.7 (H) 2.3 - 15.9 %    Comment: Performed at [facility], [place] [address], [place], [place] [address]  Type and screen [place]     Status: None (Preliminary result)   Collection Time: 06/15/21 10:10 PM  Result Value Ref Range   ABO/RH(D) PENDING    Antibody Screen PENDING    Sample Expiration      06/18/2021,2359 Performed at [facility], [place] [address], [place], [place] [address]   Prepare RBC (crossmatch)     Status: None   Collection Time: 06/15/21 10:10 PM  Result Value Ref Range   Order Confirmation      ORDER PROCESSED BY BLOOD BANK Performed at [place] [address], [place], [place] [address]    DG Chest 2 View  Result Date: 06/15/2021 CLINICAL DATA:  Shortness of breath. EXAM: CHEST - 2 VIEW COMPARISON:  December 20, 2019. FINDINGS: Stable cardiomegaly. Minimal bibasilar subsegmental atelectasis is noted. Bony thorax is unremarkable. IMPRESSION: Minimal bibasilar subsegmental atelectasis.  Mild cardiomegaly. Electronically Signed   By: Marijo Conception M.D.   On: 06/15/2021 17:20    Pending Labs Unresulted Labs (From admission, onward)     Start     Ordered   06/16/21 0500  CBC  Tomorrow morning,   R        06/15/21 2129   06/16/21 4562  Basic metabolic panel  Tomorrow morning,   R        06/15/21 2129   Pending  Brain natriuretic peptide  ONCE - STAT,   R        Pending            Vitals/Pain Today's Vitals   06/15/21 1900 06/15/21 1930 06/15/21 2007 06/15/21 2100  BP: (!) 128/39 (!) 128/105 111/69 (!) 109/52  Pulse: 69 73 69 62  Resp: '18 14 16 16  '$ Temp:      SpO2: 95% 94% 94% 97%  Weight:      Height:      PainSc:        Isolation Precautions No active isolations  Medications Medications  sodium chloride flush (NS) 0.9 % injection 3 mL (has no administration in time range)  0.9 %  sodium chloride infusion (has no administration in time range)  acetaminophen (TYLENOL) tablet 650 mg (has no administration in time range)    Or  acetaminophen  (TYLENOL) suppository 650 mg (has no administration in time range)  senna-docusate (Senokot-S) tablet 1 tablet (has no administration in time range)  ondansetron (ZOFRAN) tablet 4 mg (has no administration in time range)    Or  ondansetron (ZOFRAN) injection 4 mg (has no administration in time range)  albuterol (PROVENTIL) (2.5 MG/3ML) 0.083% nebulizer solution 2.5 mg (has no administration in time range)  0.9 %  sodium chloride infusion (Manually program via Guardrails IV Fluids) (has no administration in time range)  ipratropium-albuterol (DUONEB) 0.5-2.5 (3) MG/3ML nebulizer solution 3 mL (3 mLs Nebulization Given 06/15/21 1749)  methylPREDNISolone sodium succinate (SOLU-MEDROL) 125 mg/2 mL injection 125 mg (125 mg Intravenous Given 06/15/21 1805)  rOPINIRole (REQUIP) tablet 1 mg (1 mg Oral Given 06/15/21 1926)    Mobility walks with device Moderate fall risk   Focused Assessments    R Recommendations: See Admitting Provider Note  Report given to:   Additional Notes:

## 2021-06-15 NOTE — ED Notes (Signed)
Pt called out stating she has had incontinence of urine; pt cleaned and new bed linens placed on bed; pt sitting on edge of bed ?

## 2021-06-16 DIAGNOSIS — J449 Chronic obstructive pulmonary disease, unspecified: Secondary | ICD-10-CM | POA: Diagnosis present

## 2021-06-16 DIAGNOSIS — E039 Hypothyroidism, unspecified: Secondary | ICD-10-CM | POA: Diagnosis present

## 2021-06-16 DIAGNOSIS — J9622 Acute and chronic respiratory failure with hypercapnia: Secondary | ICD-10-CM | POA: Diagnosis present

## 2021-06-16 DIAGNOSIS — K259 Gastric ulcer, unspecified as acute or chronic, without hemorrhage or perforation: Secondary | ICD-10-CM | POA: Diagnosis not present

## 2021-06-16 DIAGNOSIS — N1832 Chronic kidney disease, stage 3b: Secondary | ICD-10-CM | POA: Diagnosis present

## 2021-06-16 DIAGNOSIS — G4733 Obstructive sleep apnea (adult) (pediatric): Secondary | ICD-10-CM | POA: Diagnosis present

## 2021-06-16 DIAGNOSIS — G8929 Other chronic pain: Secondary | ICD-10-CM | POA: Diagnosis present

## 2021-06-16 DIAGNOSIS — J691 Pneumonitis due to inhalation of oils and essences: Secondary | ICD-10-CM | POA: Diagnosis not present

## 2021-06-16 DIAGNOSIS — I700 Atherosclerosis of aorta: Secondary | ICD-10-CM | POA: Diagnosis not present

## 2021-06-16 DIAGNOSIS — D649 Anemia, unspecified: Secondary | ICD-10-CM | POA: Diagnosis not present

## 2021-06-16 DIAGNOSIS — K254 Chronic or unspecified gastric ulcer with hemorrhage: Secondary | ICD-10-CM | POA: Diagnosis present

## 2021-06-16 DIAGNOSIS — R0602 Shortness of breath: Secondary | ICD-10-CM | POA: Diagnosis present

## 2021-06-16 DIAGNOSIS — M069 Rheumatoid arthritis, unspecified: Secondary | ICD-10-CM | POA: Diagnosis present

## 2021-06-16 DIAGNOSIS — Z95828 Presence of other vascular implants and grafts: Secondary | ICD-10-CM | POA: Diagnosis not present

## 2021-06-16 DIAGNOSIS — Z20822 Contact with and (suspected) exposure to covid-19: Secondary | ICD-10-CM | POA: Diagnosis present

## 2021-06-16 DIAGNOSIS — E785 Hyperlipidemia, unspecified: Secondary | ICD-10-CM | POA: Diagnosis present

## 2021-06-16 DIAGNOSIS — I509 Heart failure, unspecified: Secondary | ICD-10-CM | POA: Diagnosis not present

## 2021-06-16 DIAGNOSIS — K2970 Gastritis, unspecified, without bleeding: Secondary | ICD-10-CM | POA: Diagnosis not present

## 2021-06-16 DIAGNOSIS — K2950 Unspecified chronic gastritis without bleeding: Secondary | ICD-10-CM | POA: Diagnosis not present

## 2021-06-16 DIAGNOSIS — G2581 Restless legs syndrome: Secondary | ICD-10-CM | POA: Diagnosis present

## 2021-06-16 DIAGNOSIS — R058 Other specified cough: Secondary | ICD-10-CM | POA: Diagnosis not present

## 2021-06-16 DIAGNOSIS — Z66 Do not resuscitate: Secondary | ICD-10-CM | POA: Diagnosis present

## 2021-06-16 DIAGNOSIS — Z7901 Long term (current) use of anticoagulants: Secondary | ICD-10-CM | POA: Diagnosis not present

## 2021-06-16 DIAGNOSIS — K219 Gastro-esophageal reflux disease without esophagitis: Secondary | ICD-10-CM | POA: Diagnosis not present

## 2021-06-16 DIAGNOSIS — R0982 Postnasal drip: Secondary | ICD-10-CM | POA: Diagnosis not present

## 2021-06-16 DIAGNOSIS — J690 Pneumonitis due to inhalation of food and vomit: Secondary | ICD-10-CM | POA: Diagnosis not present

## 2021-06-16 DIAGNOSIS — K3189 Other diseases of stomach and duodenum: Secondary | ICD-10-CM | POA: Diagnosis not present

## 2021-06-16 DIAGNOSIS — I129 Hypertensive chronic kidney disease with stage 1 through stage 4 chronic kidney disease, or unspecified chronic kidney disease: Secondary | ICD-10-CM | POA: Diagnosis not present

## 2021-06-16 DIAGNOSIS — D62 Acute posthemorrhagic anemia: Secondary | ICD-10-CM | POA: Diagnosis not present

## 2021-06-16 DIAGNOSIS — N179 Acute kidney failure, unspecified: Secondary | ICD-10-CM | POA: Diagnosis present

## 2021-06-16 DIAGNOSIS — I5032 Chronic diastolic (congestive) heart failure: Secondary | ICD-10-CM | POA: Diagnosis present

## 2021-06-16 DIAGNOSIS — D500 Iron deficiency anemia secondary to blood loss (chronic): Secondary | ICD-10-CM | POA: Diagnosis present

## 2021-06-16 DIAGNOSIS — I493 Ventricular premature depolarization: Secondary | ICD-10-CM | POA: Diagnosis present

## 2021-06-16 DIAGNOSIS — J9601 Acute respiratory failure with hypoxia: Secondary | ICD-10-CM | POA: Diagnosis not present

## 2021-06-16 DIAGNOSIS — J9811 Atelectasis: Secondary | ICD-10-CM | POA: Diagnosis present

## 2021-06-16 DIAGNOSIS — I130 Hypertensive heart and chronic kidney disease with heart failure and stage 1 through stage 4 chronic kidney disease, or unspecified chronic kidney disease: Secondary | ICD-10-CM | POA: Diagnosis present

## 2021-06-16 DIAGNOSIS — E8729 Other acidosis: Secondary | ICD-10-CM | POA: Diagnosis present

## 2021-06-16 DIAGNOSIS — I480 Paroxysmal atrial fibrillation: Secondary | ICD-10-CM | POA: Diagnosis present

## 2021-06-16 DIAGNOSIS — J9621 Acute and chronic respiratory failure with hypoxia: Secondary | ICD-10-CM | POA: Diagnosis present

## 2021-06-16 DIAGNOSIS — D509 Iron deficiency anemia, unspecified: Secondary | ICD-10-CM | POA: Diagnosis not present

## 2021-06-16 DIAGNOSIS — I5033 Acute on chronic diastolic (congestive) heart failure: Secondary | ICD-10-CM | POA: Diagnosis not present

## 2021-06-16 DIAGNOSIS — I350 Nonrheumatic aortic (valve) stenosis: Secondary | ICD-10-CM | POA: Diagnosis present

## 2021-06-16 DIAGNOSIS — E669 Obesity, unspecified: Secondary | ICD-10-CM | POA: Diagnosis present

## 2021-06-16 LAB — CBC
HCT: 26.6 % — ABNORMAL LOW (ref 36.0–46.0)
Hemoglobin: 8.3 g/dL — ABNORMAL LOW (ref 12.0–15.0)
MCH: 26.9 pg (ref 26.0–34.0)
MCHC: 31.2 g/dL (ref 30.0–36.0)
MCV: 86.1 fL (ref 80.0–100.0)
Platelets: 403 10*3/uL — ABNORMAL HIGH (ref 150–400)
RBC: 3.09 MIL/uL — ABNORMAL LOW (ref 3.87–5.11)
RDW: 17.1 % — ABNORMAL HIGH (ref 11.5–15.5)
WBC: 5.2 10*3/uL (ref 4.0–10.5)
nRBC: 0 % (ref 0.0–0.2)

## 2021-06-16 LAB — BASIC METABOLIC PANEL
Anion gap: 9 (ref 5–15)
BUN: 50 mg/dL — ABNORMAL HIGH (ref 8–23)
CO2: 32 mmol/L (ref 22–32)
Calcium: 8.7 mg/dL — ABNORMAL LOW (ref 8.9–10.3)
Chloride: 95 mmol/L — ABNORMAL LOW (ref 98–111)
Creatinine, Ser: 1.59 mg/dL — ABNORMAL HIGH (ref 0.44–1.00)
GFR, Estimated: 31 mL/min — ABNORMAL LOW (ref >60)
Glucose, Bld: 240 mg/dL — ABNORMAL HIGH (ref 70–99)
Potassium: 4 mmol/L (ref 3.5–5.1)
Sodium: 136 mmol/L (ref 135–145)

## 2021-06-16 LAB — GLUCOSE, CAPILLARY: Glucose-Capillary: 127 mg/dL — ABNORMAL HIGH (ref 70–99)

## 2021-06-16 MED ORDER — ADULT MULTIVITAMIN W/MINERALS CH
1.0000 | ORAL_TABLET | Freq: Every day | ORAL | Status: DC
  Administered 2021-06-17 – 2021-06-21 (×5): 1 via ORAL
  Filled 2021-06-16 (×6): qty 1

## 2021-06-16 MED ORDER — SODIUM CHLORIDE 0.9 % IV SOLN
510.0000 mg | Freq: Once | INTRAVENOUS | Status: AC
  Administered 2021-06-16: 510 mg via INTRAVENOUS
  Filled 2021-06-16: qty 510

## 2021-06-16 MED ORDER — PANTOPRAZOLE SODIUM 40 MG PO TBEC
40.0000 mg | DELAYED_RELEASE_TABLET | Freq: Every day | ORAL | Status: DC
  Administered 2021-06-16 – 2021-06-18 (×3): 40 mg via ORAL
  Filled 2021-06-16 (×3): qty 1

## 2021-06-16 MED ORDER — VITAMIN D 25 MCG (1000 UNIT) PO TABS
5000.0000 [IU] | ORAL_TABLET | Freq: Every day | ORAL | Status: DC
  Administered 2021-06-16 – 2021-06-21 (×6): 5000 [IU] via ORAL
  Filled 2021-06-16 (×6): qty 5

## 2021-06-16 MED ORDER — ROPINIROLE HCL 1 MG PO TABS
1.0000 mg | ORAL_TABLET | Freq: Every day | ORAL | Status: DC
  Administered 2021-06-16 – 2021-06-18 (×3): 1 mg via ORAL
  Filled 2021-06-16 (×3): qty 1

## 2021-06-16 NOTE — Evaluation (Signed)
Occupational Therapy Evaluation ?Patient Details ?Name: Lisa Mcclure ?MRN: [id number] ?DOB: 1933-04-04 ?Today's Date: 06/16/2021 ? ? ?History of Present Illness Patient is an 88 year old female admitted with dyspnea. PMH: HFpEF, paroxysmal atrial fibrillation on Eliquis, CKD stage IIIb, HTN, hypothyroidism, small airway disease, anemia, venous insufficiency, AAA s/p endovascular stent graft, and OSA intolerant to CPAP using supplemental O2 via [place]  ? ?Clinical Impression ?  ?Patient lives alone and is typically mod I for self care and mobility. Patient reports using cane for outdoor mobility and only recently using home O2 during the day due to dyspnea. Currently patient is min G assist for functional transfers. Reports chronic incontinence with use of pads at home, even with pure wick and mesh underwear with pad patient incontinent of urine on floor with standing. Patient needing mod A for lower body dressing and min A for washing legs while seated edge of bed. Patient states she does have sock aid at home "if I could figure out how to use it." Would benefit from further education on adaptive equipment and acute OT services to maximize patient safety and independence with self care.   ?   ? ?Recommendations for follow up therapy are one component of a multi-disciplinary discharge planning process, led by the attending physician.  Recommendations may be updated based on patient status, additional functional criteria and insurance authorization.  ? ?Follow Up Recommendations ? Home health OT  ?  ?Assistance Recommended at Discharge Intermittent Supervision/Assistance  ?Patient can return home with the following A little help with walking and/or transfers;A little help with bathing/dressing/bathroom;Help with stairs or ramp for entrance ? ?  ?Functional Status Assessment ? Patient has had a recent decline in their functional status and demonstrates the ability to make significant improvements in function in a  reasonable and predictable amount of time.  ?Equipment Recommendations ? Tub/shower seat  ?  ?   ?Precautions / Restrictions Precautions ?Precaution Comments: monitor sats ?Restrictions ?Weight Bearing Restrictions: No  ? ?  ? ?Mobility Bed Mobility ?  ?  ?  ?  ?  ?  ?  ?General bed mobility comments: sitting at edge of bed upon arrival ?  ? ? ? ?  ?Balance Overall balance assessment: Needs assistance ?Sitting-balance support: Feet supported ?Sitting balance-Leahy Scale: Fair ?  ?  ?Standing balance support: No upper extremity supported ?Standing balance-Leahy Scale: Fair ?  ?  ?  ?  ?  ?  ?  ?  ?  ?  ?  ?  ?   ? ?ADL either performed or assessed with clinical judgement  ? ?ADL Overall ADL's : Needs assistance/impaired ?Eating/Feeding: Independent;Sitting ?  ?Grooming: Set up;Sitting ?  ?Upper Body Bathing: Set up;Sitting ?  ?Lower Body Bathing: Minimal assistance;Sitting/lateral leans;Sit to/from stand ?Lower Body Bathing Details (indicate cue type and reason): Patient washes peri area, upper leg and able to reach to roughly ankle level while seated due to urinary incontinence ?Upper Body Dressing : Set up;Sitting ?  ?Lower Body Dressing: Moderate assistance;Sitting/lateral leans;Sit to/from stand ?Lower Body Dressing Details (indicate cue type and reason): Patient needing assist to don socks, mod A to don mesh underwear with reports of shortness of breath. ?Toilet Transfer: Min guard;Stand-pivot;Rolling walker (2 wheels);BSC/3in1 ?Toilet Transfer Details (indicate cue type and reason): min G for safety ?Toileting- Water quality scientist and Hygiene: Min guard;Sit to/from stand ?Toileting - Clothing Manipulation Details (indicate cue type and reason): Patient able to perform peri care after incontinence of voiding, uses pads in  underwear at baseline due to severe incontinence ?  ?  ?Functional mobility during ADLs: Min guard;Rolling walker (2 wheels) ?   ? ? ? ?   ?   ?   ?   ? ?Pertinent Vitals/Pain Pain  Assessment ?Pain Assessment: No/denies pain  ? ? ? ?Hand Dominance Right ?  ?Extremity/Trunk Assessment Upper Extremity Assessment ?Upper Extremity Assessment: RUE deficits/detail;LUE deficits/detail ?RUE Deficits / Details: Pt reports frozen shoulder with significantly limited AROM at shoulder joint ?LUE Deficits / Details: Patient reports frozen shoulder with significantly limited AROM at shoulder joint ?  ?Lower Extremity Assessment ?Lower Extremity Assessment: Defer to PT evaluation ?  ?  ?  ?Communication Communication ?Communication: No difficulties ?  ?Cognition Arousal/Alertness: Awake/alert ?Behavior During Therapy: [facility] for tasks assessed/performed ?Overall Cognitive Status: Within Functional Limits for tasks assessed ?  ?  ?  ?  ?  ?  ?  ?  ?  ?  ?  ?  ?  ?  ?  ?  ?  ?  ?  ?   ?   ?   ? ? ?Home Living Family/patient expects to be discharged to:: Private residence ?Living Arrangements: Alone ?Available Help at Discharge: Family ?Type of Home: House ?  ?  ?  ?  ?  ?  ?  ?  ?  ?  ?  ?Home Equipment: Rollator (4 wheels);Rolling Walker (2 wheels);Cane - single point;Adaptive equipment ?Adaptive Equipment: Sock aid ?  ?  ? ?  ?Prior Functioning/Environment Prior Level of Function : Independent/Modified Independent ?  ?  ?  ?  ?  ?  ?Mobility Comments: Patient uses a cane for outdoor mobility, no AD indoor ?ADLs Comments: Patient reports difficulty with socks, has sock aid. Also states difficulty pulling pants up "against fabric" of underwear. Tries to find silk underwear. Patient with severe incontinence reports going through roughly 20 pads at day ?  ? ?  ?  ?OT Problem List: Decreased activity tolerance;Impaired balance (sitting and/or standing);Cardiopulmonary status limiting activity;Decreased safety awareness;Obesity;Decreased knowledge of use of DME or AE ?  ?   ?OT Treatment/Interventions: Self-care/ADL training;DME and/or AE instruction;Energy conservation;Therapeutic activities;Patient/family  education;Balance training  ?  ?OT Goals(Current goals can be found in the care plan section) Acute Rehab OT Goals ?Patient Stated Goal: Eat breakfast ?OT Goal Formulation: With patient ?Time For Goal Achievement: 06/30/21 ?Potential to Achieve Goals: Good  ?OT Frequency: Min 2X/week ?  ? ?   ?AM-PAC OT "6 Clicks" Daily Activity     ?Outcome Measure Help from another person eating meals?: None ?Help from another person taking care of personal grooming?: A Little ?Help from another person toileting, which includes using toliet, bedpan, or urinal?: A Little ?Help from another person bathing (including washing, rinsing, drying)?: A Lot ?Help from another person to put on and taking off regular upper body clothing?: A Little ?Help from another person to put on and taking off regular lower body clothing?: A Lot ?6 Click Score: 17 ?  ?End of Session Equipment Utilized During Treatment: Rolling walker (2 wheels);Oxygen ?Nurse Communication: Mobility status ? ?Activity Tolerance: Patient tolerated treatment well ?Patient left: in chair;with call bell/phone within reach;with family/visitor present ? ?OT Visit Diagnosis: Unsteadiness on feet (R26.81);Other abnormalities of gait and mobility (R26.89)  ?              ?Time: 5701-7793 ?OT Time Calculation (min): 18 min ?Charges:  OT General Charges ?$OT Visit: 1 Visit ?OT Evaluation ?$OT Eval Low Complexity:  1 Low ? ?Delbert Phenix OT ?OT pager: [phone] ? ?Rosemary Holms ?06/16/2021, 1:44 PM ?

## 2021-06-16 NOTE — Progress Notes (Signed)
PROGRESS NOTE  Lisa Mcclure NFA:[id number] DOB: 14-Apr-1932 DOA: 06/15/2021 PCP: [name], MD  HPI/Recap of past 24 hours: Lisa Mcclure is a 88 y.o. female with medical history significant for HFpEF (EF 55-60% 04/20/2021), paroxysmal atrial fibrillation on Eliquis, CKD stage IIIb, HTN, hypothyroidism, small airway disease, anemia, venous insufficiency, AAA s/p endovascular stent graft (12/2019), and OSA intolerant to CPAP using supplemental O2 via [place] 2L qhs who presented to the ED for evaluation of worsening exertional dyspnea, fatigue and generalized weakness.  She has been less mobile than usual due to issues with her chronic osteoarthritis resulting in hip pain as well as stating that she pulled her groin recently.  Work-up revealed symptomatic anemia with hemoglobin of 7.4, severe iron deficiency, she is post 1 unit PRBC transfusion.  Seen by GI, will plan for EGD on Friday.  Eliquis will be held tonight 06/16/2021.  06/16/2021: Patient was seen and examined at her bedside.  Her daughter was present in the room.  Reports dyspnea with minimal exertion.  Assessment/Plan: Principal Problem:   Acute respiratory failure with hypoxia (HCC) Active Problems:   Symptomatic anemia   Acute renal failure superimposed on stage 3b chronic kidney disease (HCC)   Chronic heart failure with preserved ejection fraction (HFpEF) (HCC)   Paroxysmal atrial fibrillation (HCC)   OSA (obstructive sleep apnea)   Hypertension   Hypothyroidism   RLS (restless legs syndrome)   Generalized weakness  Acute respiratory failure with hypoxia ([place]) secondary to atelectasis Patient with exertional dyspnea with SPO2 as low as 86% while on 2 L O2 via [place].  Likely multifactorial related to symptomatic anemia, atelectasis, sleep apnea, obesity, and deconditioning.  CXR without evidence of pneumonia or pulmonary edema. -Continue supplemental oxygen as needed and scheduled nightly -Start incentive spirometer,  flutter valve, albuterol as needed -Transfuse 1 unit PRBC -Mobilize with PT/OT   Symptomatic anemia Hemoglobin 7.4 compared to usual baseline between 9-10.  She reports adherence to Eliquis and has not had any obvious bleeding.  FOBT was negative.  Anemia panel consistent with iron deficiency. -Transfuse 1 unit PRBCs Hemoglobin 8.3 post 1 unit PRBC transfusion.  Severe iron deficiency, rule out GI as a source Iron studies done on 06/15/2021 IV Feraheme x1 dose on 06/16/21 We will start oral ferrous sulfate after EGD. EGD planned on Friday, 06/18/2021.   Acute renal failure superimposed on stage 3b chronic kidney disease (HCC) Creatinine 2.00 on admission.  Baseline labs show variable creatinine, usually between 1.2-1.5.   AKI resolved, back to baseline creatinine 1.5 with GFR 31. IV fluid DC'd on 06/16/21 to avoid volume overload with elevated BNP greater than 300.  Chronic heart failure with preserved ejection fraction (HFpEF) ([place]) Last EF 55-60% by TTE 04/20/2021.  BNP greater than 300 Monitor volume status Continue strict I's and O's and daily weight   Paroxysmal atrial fibrillation (HCC) Stable with controlled rate and rhythm on admission. -Continue amiodarone, Coreg Hold off Eliquis as recommended by GI.   Generalized weakness Secondary to symptomatic anemia and chronic osteoarthritis with deconditioning. PT OT consulted Continue fall precautions   RLS (restless legs syndrome) Continue home Requip.   Hypothyroidism Continue home Synthroid.   Hypertension Blood pressure on the lower side on admission. -Continue home Coreg -Hold amlodipine, Bidil, losartan, Lasix Continue to monitor vital signs, avoid hypotension.   OSA (obstructive sleep apnea) History of OSA intolerant to CPAP.  Has not been wearing oxygen at night regularly. -Continue supplemental oxygen nightly and as needed during  the day   DVT prophylaxis: SCDs. Code Status: DNR, confirmed with patient on  admission. Family Communication: Daughter at bedside.  All questions answered to the best of my ability. Disposition Plan: From home, dispo pending clinical progress. Consults called: None Severity of Illness:   Inpatient status.  Patient requires at least 2 midnights for further evaluation and treatment of present condition.  Objective: Vitals:   06/16/21 0106 06/16/21 0402 06/16/21 1118 06/16/21 1534  BP: (!) 115/56 120/68 (!) 123/57 (!) 101/59  Pulse: 62 64 67 63  Resp: '18 18 15 18  '$ Temp: 98.6 F (37 C) 98 F (36.7 C) 98 F (36.7 C) 97.6 F (36.4 C)  TempSrc: Oral Oral Oral   SpO2: 94% 97% 97% 95%  Weight:      Height:        Intake/Output Summary (Last 24 hours) at 06/16/2021 1600 Last data filed at 06/16/2021 1500 Gross per 24 hour  Intake 1620 ml  Output --  Net 1620 ml   Filed Weights   06/15/21 1620 06/15/21 2351  Weight: 95.3 kg 95.8 kg    Exam:  General: 88 y.o. year-old female well developed well nourished in no acute distress.  Alert and oriented x3. Cardiovascular: Regular rate and rhythm with no rubs or gallops.  No thyromegaly or JVD noted.   Respiratory: Clear to auscultation with no wheezes or rales. Good inspiratory effort. Abdomen: Soft nontender nondistended with normal bowel sounds x4 quadrants. Musculoskeletal: Trace lower extremity edema bilaterally. Skin: No ulcerative lesions noted or rashes, Psychiatry: Mood is appropriate for condition and setting Neuro: Moves all 4 extremities.  Nonfocal.   Data Reviewed: CBC: Recent Labs  Lab 06/15/21 1712 06/16/21 0644  WBC 7.3 5.2  NEUTROABS 5.3  --   HGB 7.4* 8.3*  HCT 24.8* 26.6*  MCV 88.6 86.1  PLT 392 332*   Basic Metabolic Panel: Recent Labs  Lab 06/15/21 1712 06/16/21 0644  NA 137 136  K 4.2 4.0  CL 93* 95*  CO2 33* 32  GLUCOSE 98 240*  BUN 52* 50*  CREATININE 2.00* 1.59*  CALCIUM 8.7* 8.7*   GFR: Estimated Creatinine Clearance: 28 mL/min (A) (by C-G formula based on SCr of  1.59 mg/dL (H)). Liver Function Tests: No results for input(s): AST, ALT, ALKPHOS, BILITOT, PROT, ALBUMIN in the last 168 hours. No results for input(s): LIPASE, AMYLASE in the last 168 hours. No results for input(s): AMMONIA in the last 168 hours. Coagulation Profile: No results for input(s): INR, PROTIME in the last 168 hours. Cardiac Enzymes: No results for input(s): CKTOTAL, CKMB, CKMBINDEX, TROPONINI in the last 168 hours. BNP (last 3 results) No results for input(s): PROBNP in the last 8760 hours. HbA1C: No results for input(s): HGBA1C in the last 72 hours. CBG: No results for input(s): GLUCAP in the last 168 hours. Lipid Profile: No results for input(s): CHOL, HDL, LDLCALC, TRIG, CHOLHDL, LDLDIRECT in the last 72 hours. Thyroid Function Tests: No results for input(s): TSH, T4TOTAL, FREET4, T3FREE, THYROIDAB in the last 72 hours. Anemia Panel: Recent Labs    06/15/21 2022  VITAMINB12 300  FOLATE 10.6  FERRITIN 13  TIBC 536*  IRON 15*  RETICCTPCT 2.2   Urine analysis:    Component Value Date/Time   COLORURINE STRAW (A) 09/13/2018 1740   APPEARANCEUR CLEAR 09/13/2018 1740   LABSPEC 1.008 09/13/2018 1740   PHURINE 7.0 09/13/2018 1740   GLUCOSEU NEGATIVE 09/13/2018 1740   [place] NEGATIVE 09/13/2018 1740   BILIRUBINUR NEGATIVE 09/13/2018 1740  KETONESUR NEGATIVE 09/13/2018 1740   PROTEINUR NEGATIVE 09/13/2018 1740   UROBILINOGEN 0.2 01/06/2009 0808   NITRITE NEGATIVE 09/13/2018 1740   LEUKOCYTESUR NEGATIVE 09/13/2018 1740   Sepsis Labs: '@LABRCNTIP'$ (procalcitonin:4,lacticidven:4)  ) Recent Results (from the past 240 hour(s))  Resp Panel by RT-PCR (Flu A&B, Covid) Nasopharyngeal Swab     Status: None   Collection Time: 06/15/21  5:16 PM   Specimen: Nasopharyngeal Swab; Nasopharyngeal(NP) swabs in vial transport medium  Result Value Ref Range Status   SARS Coronavirus 2 by RT PCR NEGATIVE NEGATIVE Final    Comment: (NOTE) SARS-CoV-2 target nucleic acids are NOT  DETECTED.  The SARS-CoV-2 RNA is generally detectable in upper respiratory specimens during the acute phase of infection. The lowest concentration of SARS-CoV-2 viral copies this assay can detect is 138 copies/mL. A negative result does not preclude SARS-Cov-2 infection and should not be used as the sole basis for treatment or other patient management decisions. A negative result may occur with  improper specimen collection/handling, submission of specimen other than nasopharyngeal swab, presence of viral mutation(s) within the areas targeted by this assay, and inadequate number of viral copies(<138 copies/mL). A negative result must be combined with clinical observations, patient history, and epidemiological information. The expected result is Negative.  Fact Sheet for Patients:  EntrepreneurPulse.com.au  Fact Sheet for Healthcare Providers:  IncredibleEmployment.be  This test is no t yet approved or cleared by the [place] FDA and  has been authorized for detection and/or diagnosis of SARS-CoV-2 by FDA under an Emergency Use Authorization (EUA). This EUA will remain  in effect (meaning this test can be used) for the duration of the COVID-19 declaration under Section 564(b)(1) of the Act, 21 U.S.C.section 360bbb-3(b)(1), unless the authorization is terminated  or revoked sooner.       Influenza A by PCR NEGATIVE NEGATIVE Final   Influenza B by PCR NEGATIVE NEGATIVE Final    Comment: (NOTE) The Xpert Xpress SARS-CoV-2/FLU/RSV plus assay is intended as an aid in the diagnosis of influenza from Nasopharyngeal swab specimens and should not be used as a sole basis for treatment. Nasal washings and aspirates are unacceptable for Xpert Xpress SARS-CoV-2/FLU/RSV testing.  Fact Sheet for Patients: EntrepreneurPulse.com.au  Fact Sheet for Healthcare Providers: IncredibleEmployment.be  This test is not yet  approved or cleared by the [place] FDA and has been authorized for detection and/or diagnosis of SARS-CoV-2 by FDA under an Emergency Use Authorization (EUA). This EUA will remain in effect (meaning this test can be used) for the duration of the COVID-19 declaration under Section 564(b)(1) of the Act, 21 U.S.C. section 360bbb-3(b)(1), unless the authorization is terminated or revoked.  Performed at [facility], [place] [address], [place], [place] [address]       Studies: DG Chest 2 View  Result Date: 06/15/2021 CLINICAL DATA:  Shortness of breath. EXAM: CHEST - 2 VIEW COMPARISON:  December 20, 2019. FINDINGS: Stable cardiomegaly. Minimal bibasilar subsegmental atelectasis is noted. Bony thorax is unremarkable. IMPRESSION: Minimal bibasilar subsegmental atelectasis.  Mild cardiomegaly. Electronically Signed   By: Marijo Conception M.D.   On: 06/15/2021 17:20    Scheduled Meds:  amiodarone  200 mg Oral Daily   carvedilol  12.5 mg Oral BID WC   latanoprost  1 drop Both Eyes QHS   levothyroxine  125 mcg Oral QAC breakfast   pantoprazole  40 mg Oral Daily   rOPINIRole  1 mg Oral Daily   sodium chloride flush  3 mL Intravenous Q12H  Continuous Infusions:   LOS: 0 days     Kayleen Memos, MD [facility] Pager [phone]  If 7PM-7AM, please contact night-coverage [email or web address] Password [email or web address] 06/16/2021, 4:00 PM

## 2021-06-16 NOTE — Telephone Encounter (Signed)
Again discussed patient case with Dr. Einar Gip and reviewed chart and above findings together.  Recommendation was to continue diuresis and advised patient to focus on weight loss. Dr. Einar Gip is in agreement that there is not much additionally to be done from a cardiac standpoint.

## 2021-06-16 NOTE — Consult Note (Signed)
[facility] Gastroenterology Consult  Referring Provider: Dr. Marciano Sequin hospitalist Primary Care Physician:  [name], MD Primary Gastroenterologist: Dr. Jessie Foot GI  Reason for Consultation: Symptomatic anemia  HPI: Lisa Mcclure is a 88 y.o. female was admitted yesterday with complaints of progressively worsening exertional shortness of breath, weakness and fatigue associated with decreased mobility.  Was advised by her cardiologist to increase dosage of diuretics, despite increasing furosemide to 40 mg twice a day for 2 to 3 days, she did not have any improvement in shortness of breath. She also complains of cough, postnasal drip, no change in shortness of breath despite increasing home oxygen. In ER she was found to have a hemoglobin of 7.4, hemoglobin was 11.8 on 08/04/2020.  Patient denies noticing blood in stool or black stools. She reports having regular bowel movements. She denies unintentional weight loss, loss of appetite, acid reflux, heartburn, difficulty swallowing, pain on swallowing, bloating or early satiety. Denies use of NSAIDs.  Sigmoidoscopy 03/2015: Sigmoid diverticulosis and large external hemorrhoids Colonoscopy 2017: Chronic active ileitis, tubular adenoma removed, normal terminal ileum, diverticulosis in sigmoid, descending, transverse, ascending and internal hemorrhoids.    Past Medical History:  Diagnosis Date   Anemia    h/o hemorrhoidal bleeding and blood transfusion   Cardiomegaly    Chronic diastolic CHF (congestive heart failure) (HCC)    Depression    Diverticulosis    DOE (dyspnea on exertion)    Esophageal reflux    GERD (gastroesophageal reflux disease)    Hypothyroidism    Insomnia    LBP (low back pain)    OAB (overactive bladder)    Obesity    OSA (obstructive sleep apnea)    Osteoarthritis    Paroxysmal atrial fibrillation (HCC)    Rheumatoid arthritis(714.0)    Shoulder pain, bilateral    Unspecified essential  hypertension    Venous insufficiency     Past Surgical History:  Procedure Laterality Date   ABDOMINAL AORTIC ENDOVASCULAR STENT GRAFT N/A 12/20/2019   Procedure: Aortogram including catheter selection of aorta and bilateral iliac arteriogram, Endovascular repair of infrarenal abdominal aortic aneurysm with bifurcated stent graft (26 mm x 14 x 12 main body, right bell bottom with a 20 mm x 10 cm piece, and left bell bottom with a 16 mm x 12 cm piece) ;  Surgeon: Marty Heck, MD;  Location: [place];  Service: Vascular;  Laterality: N/A;   APPENDECTOMY  1953   bladder abduction-1996  1996   breast biopsy Right 1980   CARDIOVERSION N/A 12/24/2019   Procedure: CARDIOVERSION;  Surgeon: Adrian Prows, MD;  Location: [facility] ENDOSCOPY;  Service: Cardiovascular;  Laterality: N/A;   [place] N/A 04/10/2015   Procedure: FLEXIBLE SIGMOIDOSCOPY;  Surgeon: Arta Silence, MD;  Location: [facility] ENDOSCOPY;  Service: Endoscopy;  Laterality: N/A;   knee arthroscopy Right 1996, 2010   KNEE ARTHROSCOPY W/ AUTOGENOUS CARTILAGE IMPLANTATION (ACI) PROCEDURE Left 1994, 1995   REFRACTIVE SURGERY  01/2020   SHOULDER SURGERY  1990   TEE WITHOUT CARDIOVERSION N/A 12/24/2019   Procedure: TRANSESOPHAGEAL ECHOCARDIOGRAM (TEE);  Surgeon: Adrian Prows, MD;  Location: [place];  Service: Cardiovascular;  Laterality: N/A;   [place] Bilateral 12/20/2019   Procedure: Ultrasound-guided access of bilateral common femoral arteries for delivery of endograft and percutaneous closure;  Surgeon: Monica Martinez  J, MD;  Location: [place];  Service: Vascular;  Laterality: Bilateral;   VESICOVAGINAL FISTULA CLOSURE W/ TAH     WRIST SURGERY  1967    Prior to Admission medications   Medication Sig Start Date End Date Taking? Authorizing Provider  albuterol (VENTOLIN  HFA) 108 (90 Base) MCG/ACT inhaler Inhale 2 puffs into the lungs every 6 (six) hours as needed for wheezing or shortness of breath. 06/10/21  Yes Cantwell, [name] C, PA-C  amiodarone (PACERONE) 100 MG tablet Take 2 tablets (200 mg total) by mouth daily. 04/16/21  Yes Cantwell, [name] C, PA-C  amLODipine (NORVASC) 10 MG tablet TAKE ONE TABLET BY MOUTH AT NOON Patient taking differently: Take 10 mg by mouth daily at 12 noon. 11/09/20  Yes Cantwell, [name] C, PA-C  apixaban (ELIQUIS) 2.5 MG TABS tablet Take 1 tablet (2.5 mg total) by mouth 2 (two) times daily. 02/16/21  Yes Cantwell, [name] C, PA-C  Bempedoic Acid-Ezetimibe (NEXLIZET) 180-10 MG TABS Take 1 capsule by mouth daily. Patient taking differently: Take 1 capsule by mouth daily. Taking it at bedtime 02/11/19  Yes Adrian Prows, MD  carvedilol (COREG) 12.5 MG tablet TAKE ONE TABLET BY MOUTH AT BREAKFAST AND AT BEDTIME Patient taking differently: Take 12.5 mg by mouth 2 (two) times daily with a meal. 10/13/20  Yes Cantwell, [name] C, PA-C  Cholecalciferol (VITAMIN D3) 125 MCG (5000 UT) CAPS Take 5,000 Units by mouth at bedtime.   Yes [provider]  furosemide (LASIX) 20 MG tablet Take 1 tablet (20 mg total) by mouth daily as needed for fluid or edema. Take Lasix 20 mg once daily with an additional dose as needed for fluid overload. Patient taking differently: Take 20 mg by mouth daily. Take '20mg'$  if needed for fluid overload 11/16/20  Yes Cantwell, [name] C, PA-C  HYDROcodone-acetaminophen (NORCO) 7.5-325 MG tablet Take 1 tablet by mouth every 6 (six) hours as needed for moderate pain. 04/13/15  Yes Barton Dubois, MD  isosorbide-hydrALAZINE (BIDIL) 20-37.5 MG tablet Take 1 tablet by mouth 3 (three) times daily.   Yes [provider]  latanoprost (XALATAN) 0.005 % ophthalmic solution Place 1 drop into both eyes at bedtime.   Yes [provider]  levothyroxine (SYNTHROID) 125 MCG tablet Take 125 mcg by mouth daily before breakfast.     Yes [provider]  losartan (COZAAR) 100 MG tablet TAKE ONE TABLET BY MOUTH AT NOON Patient taking differently: Take 100 mg by mouth daily at 12 noon. 06/03/21  Yes Cantwell, [name] C, PA-C  Multiple Vitamins-Minerals (ICAPS AREDS 2 PO) Take 1 capsule by mouth 2 (two) times daily.   Yes [provider]  omeprazole (PRILOSEC) 20 MG capsule Take 1 capsule (20 mg total) by mouth daily. 02/11/20  Yes Cantwell, [name] C, PA-C  rOPINIRole (REQUIP) 1 MG tablet Take 1 mg by mouth See admin instructions. Take 1 mg by mouth once a day between 3 PM-4 PM and an additional 1 mg in the evening, if no relief 06/19/15  Yes [provider]  albuterol (PROVENTIL) (2.5 MG/3ML) 0.083% nebulizer solution Take 2.5 mg by nebulization every 6 (six) hours as needed for wheezing or shortness of breath. Patient not taking: Reported on 06/15/2021    [provider]  diclofenac sodium (VOLTAREN) 1 % GEL Apply 2.25 g topically 3 (three) times daily as needed (for pain).  10/23/18   [provider]    Current Facility-Administered Medications  Medication Dose Route Frequency Provider Last Rate Last Admin  acetaminophen (TYLENOL) tablet 650 mg  650 mg Oral Q6H PRN Lenore Cordia, MD       Or   acetaminophen (TYLENOL) suppository 650 mg  650 mg Rectal Q6H PRN Lenore Cordia, MD       albuterol (PROVENTIL) (2.5 MG/3ML) 0.083% nebulizer solution 2.5 mg  2.5 mg Nebulization Q2H PRN Lenore Cordia, MD       amiodarone (PACERONE) tablet 200 mg  200 mg Oral Daily Zada Finders R, MD   200 mg at 06/16/21 0839   carvedilol (COREG) tablet 12.5 mg  12.5 mg Oral BID WC Zada Finders R, MD   12.5 mg at 06/16/21 0839   HYDROcodone-acetaminophen (NORCO) 7.5-325 MG per tablet 1 tablet  1 tablet Oral Q6H PRN Lenore Cordia, MD   1 tablet at 06/16/21 1133   latanoprost (XALATAN) 0.005 % ophthalmic solution 1 drop  1 drop Both Eyes QHS Lenore Cordia, MD       levothyroxine (SYNTHROID) tablet  125 mcg  125 mcg Oral QAC breakfast Lenore Cordia, MD   125 mcg at 06/16/21 0507   ondansetron (ZOFRAN) tablet 4 mg  4 mg Oral Q6H PRN Lenore Cordia, MD       Or   ondansetron (ZOFRAN) injection 4 mg  4 mg Intravenous Q6H PRN Lenore Cordia, MD       rOPINIRole (REQUIP) tablet 1 mg  1 mg Oral QHS PRN Zada Finders R, MD   1 mg at 06/16/21 0022   rOPINIRole (REQUIP) tablet 1 mg  1 mg Oral Daily Irene Pap N, DO   1 mg at 06/16/21 1201   senna-docusate (Senokot-S) tablet 1 tablet  1 tablet Oral QHS PRN Lenore Cordia, MD       sodium chloride flush (NS) 0.9 % injection 3 mL  3 mL Intravenous Q12H Zada Finders R, MD   3 mL at 06/16/21 0839    Allergies as of 06/15/2021 - Review Complete 06/15/2021  Allergen Reaction Noted   Statins Other (See Comments) 02/24/2016   Atorvastatin  05/17/2021   Gabapentin Other (See Comments) 02/24/2016   Methocarbamol Other (See Comments) 02/24/2016    Family History  Problem Relation Age of Onset   COPD Father    Lung cancer Mother    Heart attack Maternal Grandmother 80   Breast cancer Maternal Aunt    Lung cancer Son     Social History   Socioeconomic History   Marital status: Widowed    Spouse name: Not on file   Number of children: 3   Years of education: College   Highest education level: Not on file  Occupational History   Occupation: Retired  Tobacco Use   Smoking status: Former    Packs/day: 0.20    Years: 10.00    Pack years: 2.00    Types: Cigarettes    Quit date: 04/11/1984    Years since quitting: 37.2   Smokeless tobacco: Never  Vaping Use   Vaping Use: Never used  Substance and Sexual Activity   Alcohol use: Yes    Alcohol/week: 0.0 standard drinks    Comment: cocktail once a week   Drug use: No   Sexual activity: Not on file  Other Topics Concern   Not on file  Social History Narrative   Widowed   Lives alone   3 children, 2 living   OCCUPATION: retired from Physicist, medical, 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    Gen:  weakness, malaise,  denies any fever, chills, rigors, night sweats, anorexia, fatigue,involuntary weight loss, and sleep disorder CV: Denies chest pain, angina, palpitations, syncope, orthopnea, PND, peripheral edema, and claudication. Resp: dyspnea, cough, sputum, denies  wheezing, coughing up blood. GU : Denies urinary burning, blood in urine, urinary frequency, urinary hesitancy, nocturnal urination, and urinary incontinence. MS: Multiple joint pain, arthritis Derm: Denies rash, itching, oral ulcerations, hives, unhealing ulcers.  Psych: Denies depression, anxiety, memory loss, suicidal ideation, hallucinations,  and confusion. Heme: Denies bruising, bleeding, and enlarged lymph nodes. Neuro:  Denies any headaches, dizziness, paresthesias. Endo:  hypothyroid, denies any problems with DM,  adrenal function.  Physical Exam: Vital signs in last 24 hours: Temp:  [98 F (36.7 C)-98.6 F (37 C)] 98 F (36.7 C) (03/08 1118) Pulse Rate:  [58-74] 67 (03/08 1118) Resp:  [14-18] 15 (03/08 1118) BP: (104-128)/(39-105) 123/57 (03/08 1118) SpO2:  [93 %-99 %] 97 % (03/08 1118) Weight:  [95.3 kg-95.8 kg] 95.8 kg (03/07 2351) Last BM Date : 06/15/21  General:   Alert,  Well-developed, overweight, pleasant and cooperative in NAD Head:  Normocephalic and atraumatic. Eyes:  Sclera clear, no icterus.   Mild pallor Ears:  Normal auditory acuity. Nose:  No deformity, discharge,  or lesions. Mouth:  No deformity or lesions.  Oropharynx pink & moist. Neck:  Supple; no masses or  thyromegaly. Lungs:  Clear throughout to auscultation.   No wheezes, crackles, or rhonchi. No acute distress. Heart:  Regular rate and rhythm; no murmurs, clicks, rubs,  or gallops. Extremities:  Without clubbing or edema. Neurologic:  Alert and  oriented x4;  grossly normal neurologically. Skin:  Intact without significant lesions or rashes. Psych:  Alert and cooperative. Normal mood and affect. Abdomen:  Soft, nontender and nondistended. No masses, hepatosplenomegaly or hernias noted. Normal bowel sounds, without guarding, and without rebound.         Lab Results: Recent Labs    06/15/21 1712 06/16/21 0644  WBC 7.3 5.2  HGB 7.4* 8.3*  HCT 24.8* 26.6*  PLT 392 403*   BMET Recent Labs    06/15/21 1712 06/16/21 0644  NA 137 136  K 4.2 4.0  CL 93* 95*  CO2 33* 32  GLUCOSE 98 240*  BUN 52* 50*  CREATININE 2.00* 1.59*  CALCIUM 8.7* 8.7*   LFT No results for input(s): PROT, ALBUMIN, AST, ALT, ALKPHOS, BILITOT, BILIDIR, IBILI in the last 72 hours. PT/INR No results for input(s): LABPROT, INR in the last 72 hours.  Studies/Results: DG Chest 2 View  Result Date: 06/15/2021 CLINICAL DATA:  Shortness of breath. EXAM: CHEST - 2 VIEW COMPARISON:  December 20, 2019. FINDINGS: Stable cardiomegaly. Minimal bibasilar subsegmental atelectasis is noted. Bony thorax is unremarkable. IMPRESSION: Minimal bibasilar subsegmental atelectasis.  Mild cardiomegaly. Electronically Signed   By: Marijo Conception M.D.   On: 06/15/2021 17:20    Impression: Symptomatic anemia without obvious melena or hematochezia Hemoglobin 7.4 on presentation, status post 1 unit PRBC transfusion, appropriately elevated hemoglobin to 8.3  Elevated platelet, 403, question reactive thrombocytosis Iron saturation 3%, elevated TIBC 536 compatible with iron deficiency anemia, ferritin 13  Elevated BUN/creatinine 50/1.5  Paroxysmal atrial fibrillation on Eliquis-last dose today morning Heart failure, EF 55 to 60% on  1/23 Chronic kidney disease stage III Hypertension Hypothyroidism Abdominal aortic aneurysm, status post endovascular graft on 9/21 Obstructive sleep apnea, unable to tolerate CPAP, on oxygen by nasal cannula  Plan: Hold Eliquis from today evening Regular diet today, clear liquid diet for dinner tomorrow EGD in a.m. on Friday Will start pantoprazole 40 mg once a day. The risks and the benefits of the procedure were discussed with the patient in details. She understands and verbalizes consent.   LOS: 0 days   Ronnette Juniper, MD  06/16/2021, 2:49 PM

## 2021-06-16 NOTE — Evaluation (Signed)
Physical Therapy Evaluation ?Patient Details ?Name: Lisa Mcclure ?MRN: [id number] ?DOB: Aug 15, 1932 ?Today's Date: 06/16/2021 ? ?History of Present Illness ? 88 year old female admitted with dyspnea. PMH: HFpEF, paroxysmal atrial fibrillation on Eliquis, CKD stage IIIb, HTN, hypothyroidism, small airway disease, anemia, venous insufficiency, AAA s/p endovascular stent graft, and OSA intolerant to CPAP using supplemental O2 via [place]  ?Clinical Impression ? On eval, pt was Min guard assist for mobility. She walked ~120 feet with a RW. O2 92% on RA, dyspnea 2.5/4 during session. Pt continues to report dyspnea on exertion. She also reports general weakness. Will plan to follow during hospital stay. She is agreeable to HHPT f/u.  ?   ? ?Recommendations for follow up therapy are one component of a multi-disciplinary discharge planning process, led by the attending physician.  Recommendations may be updated based on patient status, additional functional criteria and insurance authorization. ? ?Follow Up Recommendations Home health PT ? ?  ?Assistance Recommended at Discharge PRN  ?Patient can return home with the following ? Assist for transportation;Assistance with cooking/housework;Help with stairs or ramp for entrance ? ?  ?Equipment Recommendations None recommended by PT  ?Recommendations for Other Services ?    ?  ?Functional Status Assessment Patient has had a recent decline in their functional status and demonstrates the ability to make significant improvements in function in a reasonable and predictable amount of time.  ? ?  ?Precautions / Restrictions Precautions ?Precautions: Fall ?Precaution Comments: monitor sats ?Restrictions ?Weight Bearing Restrictions: No  ? ?  ? ?Mobility ? Bed Mobility ?  ?  ?  ?  ?  ?  ?  ?General bed mobility comments: sitting at edge of bed upon arrival ?  ? ?Transfers ?Overall transfer level: Needs assistance ?Equipment used: Rolling walker (2 wheels), None ?Transfers: Sit to/from  Stand ?Sit to Stand: Min guard ?  ?  ?  ?  ?  ?  ?  ? ?Ambulation/Gait ?Ambulation/Gait assistance: Min guard ?Gait Distance (Feet): 120 Feet ?Assistive device: Rolling walker (2 wheels) ?Gait Pattern/deviations: Step-through pattern, Decreased stride length ?  ?  ?  ?General Gait Details: Seems to use walker to steady-not heavily reliant upon it. O2 92% on RA, dyspnea 2/4. ? ?Stairs ?  ?  ?  ?  ?  ? ?Wheelchair Mobility ?  ? ?Modified Rankin (Stroke Patients Only) ?  ? ?  ? ?Balance Overall balance assessment: Mild deficits observed, not formally tested ?  ?  ?  ?  ?Standing balance support: During functional activity ?Standing balance-Leahy Scale: Fair ?  ?  ?  ?  ?  ?  ?  ?  ?  ?  ?  ?  ?   ? ? ? ?Pertinent Vitals/Pain Pain Assessment ?Pain Assessment: No/denies pain  ? ? ?Home Living Family/patient expects to be discharged to:: Private residence ?Living Arrangements: Alone ?Available Help at Discharge: Family ?Type of Home: House ?  ?  ?  ?  ?  ?Home Equipment: Rollator (4 wheels);Rolling Walker (2 wheels);Cane - single point;Adaptive equipment ?   ?  ?Prior Function Prior Level of Function : Independent/Modified Independent ?  ?  ?  ?  ?  ?  ?Mobility Comments: Patient uses a cane for outdoor mobility, no AD indoor ?ADLs Comments: Patient reports difficulty with socks, has sock aid. Also states difficulty pulling pants up "against fabric" of underwear. Tries to find silk underwear. Patient with severe incontinence reports going through roughly 20 pads at day ?  ? ? ?  Hand Dominance  ? Dominant Hand: Right ? ?  ?Extremity/Trunk Assessment  ? Upper Extremity Assessment ?Upper Extremity Assessment: Defer to OT evaluation ?RUE Deficits / Details: Pt reports frozen shoulder with significantly limited AROM at shoulder joint ?LUE Deficits / Details: Patient reports frozen shoulder with significantly limited AROM at shoulder joint ?  ? ?Lower Extremity Assessment ?Lower Extremity Assessment: Generalized weakness ?   ? ?Cervical / Trunk Assessment ?Cervical / Trunk Assessment: Normal  ?Communication  ? Communication: No difficulties  ?Cognition Arousal/Alertness: Awake/alert ?Behavior During Therapy: [facility] for tasks assessed/performed ?Overall Cognitive Status: Within Functional Limits for tasks assessed ?  ?  ?  ?  ?  ?  ?  ?  ?  ?  ?  ?  ?  ?  ?  ?  ?  ?  ?  ? ?  ?General Comments   ? ?  ?Exercises    ? ?Assessment/Plan  ?  ?PT Assessment Patient needs continued PT services  ?PT Problem List Decreased mobility;Decreased activity tolerance;Decreased balance;Decreased knowledge of use of DME ? ?   ?  ?PT Treatment Interventions DME instruction;Gait training;Therapeutic exercise;Balance training;Functional mobility training;Therapeutic activities;Patient/family education   ? ?PT Goals (Current goals can be found in the Care Plan section)  ?Acute Rehab PT Goals ?Patient Stated Goal: to breathe better ?PT Goal Formulation: With patient ?Time For Goal Achievement: 06/30/21 ?Potential to Achieve Goals: Good ? ?  ?Frequency Min 3X/week ?  ? ? ?Co-evaluation   ?  ?  ?  ?  ? ? ?  ?AM-PAC PT "6 Clicks" Mobility  ?Outcome Measure Help needed turning from your back to your side while in a flat bed without using bedrails?: A Little ?Help needed moving from lying on your back to sitting on the side of a flat bed without using bedrails?: A Little ?Help needed moving to and from a bed to a chair (including a wheelchair)?: A Little ?Help needed standing up from a chair using your arms (e.g., wheelchair or bedside chair)?: A Little ?Help needed to walk in hospital room?: A Little ?Help needed climbing 3-5 steps with a railing? : A Little ?6 Click Score: 18 ? ?  ?End of Session   ?Activity Tolerance: Patient tolerated treatment well ?Patient left: in bed;with call bell/phone within reach;with family/visitor present ?  ?PT Visit Diagnosis: Unsteadiness on feet (R26.81);Difficulty in walking, not elsewhere classified (R26.2) ?  ? ?Time:  4665-9935 ?PT Time Calculation (min) (ACUTE ONLY): 33 min ? ? ?Charges:   PT Evaluation ?$PT Eval Moderate Complexity: 1 Mod ?PT Treatments ?$Gait Training: 8-22 mins ?  ?   ? ? ? ? ? ?Doreatha Massed, PT ?Acute Rehabilitation  ?Office: [phone] ?Pager: [phone] ? ?  ? ?

## 2021-06-17 ENCOUNTER — Other Ambulatory Visit: Payer: Self-pay | Admitting: Student

## 2021-06-17 DIAGNOSIS — J9601 Acute respiratory failure with hypoxia: Secondary | ICD-10-CM | POA: Diagnosis not present

## 2021-06-17 LAB — BPAM RBC
Blood Product Expiration Date: 202303292359
ISSUE DATE / TIME: 202303080044
Unit Type and Rh: 7300

## 2021-06-17 LAB — CBC
HCT: 28.8 % — ABNORMAL LOW (ref 36.0–46.0)
Hemoglobin: 8.8 g/dL — ABNORMAL LOW (ref 12.0–15.0)
MCH: 26.7 pg (ref 26.0–34.0)
MCHC: 30.6 g/dL (ref 30.0–36.0)
MCV: 87.3 fL (ref 80.0–100.0)
Platelets: 400 10*3/uL (ref 150–400)
RBC: 3.3 MIL/uL — ABNORMAL LOW (ref 3.87–5.11)
RDW: 17.1 % — ABNORMAL HIGH (ref 11.5–15.5)
WBC: 8.5 10*3/uL (ref 4.0–10.5)
nRBC: 0 % (ref 0.0–0.2)

## 2021-06-17 LAB — HEPARIN LEVEL (UNFRACTIONATED): Heparin Unfractionated: 0.67 IU/mL (ref 0.30–0.70)

## 2021-06-17 LAB — TYPE AND SCREEN
ABO/RH(D): B POS
Antibody Screen: NEGATIVE
Unit division: 0

## 2021-06-17 LAB — APTT: aPTT: 32 seconds (ref 24–36)

## 2021-06-17 MED ORDER — HEPARIN (PORCINE) 25000 UT/250ML-% IV SOLN
1050.0000 [IU]/h | INTRAVENOUS | Status: AC
  Administered 2021-06-17: 13:00:00 1050 [IU]/h via INTRAVENOUS
  Filled 2021-06-17: qty 250

## 2021-06-17 MED ORDER — BEMPEDOIC ACID-EZETIMIBE 180-10 MG PO TABS
1.0000 | ORAL_TABLET | Freq: Every day | ORAL | Status: DC
Start: 2021-06-17 — End: 2021-06-17

## 2021-06-17 MED ORDER — IPRATROPIUM-ALBUTEROL 0.5-2.5 (3) MG/3ML IN SOLN
3.0000 mL | Freq: Three times a day (TID) | RESPIRATORY_TRACT | Status: DC
  Administered 2021-06-18 – 2021-06-21 (×10): 3 mL via RESPIRATORY_TRACT
  Filled 2021-06-17 (×10): qty 3

## 2021-06-17 MED ORDER — ALBUTEROL SULFATE HFA 108 (90 BASE) MCG/ACT IN AERS: 2.0000 | INHALATION_SPRAY | Freq: Four times a day (QID) | RESPIRATORY_TRACT | Status: DC | PRN

## 2021-06-17 MED ORDER — SODIUM CHLORIDE 0.9 % IV SOLN: INTRAVENOUS | Status: DC

## 2021-06-17 MED ORDER — EZETIMIBE 10 MG PO TABS
10.0000 mg | ORAL_TABLET | Freq: Every day | ORAL | Status: DC
  Administered 2021-06-17 – 2021-06-21 (×5): 10 mg via ORAL
  Filled 2021-06-17 (×5): qty 1

## 2021-06-17 MED ORDER — IPRATROPIUM-ALBUTEROL 0.5-2.5 (3) MG/3ML IN SOLN
3.0000 mL | Freq: Four times a day (QID) | RESPIRATORY_TRACT | Status: DC
  Administered 2021-06-17 (×3): 3 mL via RESPIRATORY_TRACT
  Filled 2021-06-17 (×3): qty 3

## 2021-06-17 MED ORDER — ISOSORB DINITRATE-HYDRALAZINE 20-37.5 MG PO TABS
1.0000 | ORAL_TABLET | Freq: Three times a day (TID) | ORAL | Status: DC
  Administered 2021-06-17 (×2): 1 via ORAL
  Filled 2021-06-17 (×3): qty 1

## 2021-06-17 NOTE — Progress Notes (Signed)
ANTICOAGULATION CONSULT NOTE - Initial Consult ? ?Pharmacy Consult for heparin ?Indication: atrial fibrillation ? ?Allergies  ?Allergen Reactions  ? Statins Other (See Comments)  ?  Muscle aches and INTERNAL BLEEDING  ? Atorvastatin   ?  Other reaction(s): Myalgias  ? Gabapentin Other (See Comments)  ?  "Made me loopy"  ? Methocarbamol Other (See Comments)  ?  "Made me loopy"  ? ? ?Patient Measurements: ?Height: '5\' 3"'$  (160 cm) ?Weight: 98.4 kg (216 lb 14.9 oz) ?IBW/kg (Calculated) : 52.4 ?Heparin Dosing Weight: 74.6 kg ? ?Vital Signs: ?Temp: 97.5 ?F (36.4 ?C) (03/09 0457) ?Temp Source: Oral (03/09 0457) ?BP: 153/69 (03/09 0457) ?Pulse Rate: 59 (03/09 0457) ? ?Labs: ?Recent Labs  ?  06/15/21 ?1712 06/16/21 ?0644 06/17/21 ?9417  ?HGB 7.4* 8.3* 8.8*  ?HCT 24.8* 26.6* 28.8*  ?PLT 392 403* 400  ?CREATININE 2.00* 1.59*  --   ? ? ?Estimated Creatinine Clearance: 28.4 mL/min (A) (by C-G formula based on SCr of 1.59 mg/dL (H)). ? ? ?Medical History: ?Past Medical History:  ?Diagnosis Date  ? Anemia   ? h/o hemorrhoidal bleeding and blood transfusion  ? Cardiomegaly   ? Chronic diastolic CHF (congestive heart failure) ([place])   ? Depression   ? Diverticulosis   ? DOE (dyspnea on exertion)   ? Esophageal reflux   ? GERD (gastroesophageal reflux disease)   ? Hypothyroidism   ? Insomnia   ? LBP (low back pain)   ? OAB (overactive bladder)   ? Obesity   ? OSA (obstructive sleep apnea)   ? Osteoarthritis   ? Paroxysmal atrial fibrillation (HCC)   ? Rheumatoid arthritis(714.0)   ? Shoulder pain, bilateral   ? Unspecified essential hypertension   ? Venous insufficiency   ? ? ?Medications:  ?Medications Prior to Admission  ?Medication Sig Dispense Refill Last Dose  ? albuterol (VENTOLIN HFA) 108 (90 Base) MCG/ACT inhaler Inhale 2 puffs into the lungs every 6 (six) hours as needed for wheezing or shortness of breath. 8 g 2 Past Week  ? amiodarone (PACERONE) 100 MG tablet Take 2 tablets (200 mg total) by mouth daily. 90 tablet 2 06/15/2021  at 1130  ? amLODipine (NORVASC) 10 MG tablet TAKE ONE TABLET BY MOUTH AT NOON (Patient taking differently: Take 10 mg by mouth daily at 12 noon.) 90 tablet 1 06/14/2021 at 1600  ? apixaban (ELIQUIS) 2.5 MG TABS tablet Take 1 tablet (2.5 mg total) by mouth 2 (two) times daily. 180 tablet 3 06/14/2021 at 1030  ? Bempedoic Acid-Ezetimibe (NEXLIZET) 180-10 MG TABS Take 1 capsule by mouth daily. (Patient taking differently: Take 1 capsule by mouth daily. Taking it at bedtime) 30 tablet 3 06/14/2021  ? carvedilol (COREG) 12.5 MG tablet TAKE ONE TABLET BY MOUTH AT BREAKFAST AND AT BEDTIME (Patient taking differently: Take 12.5 mg by mouth 2 (two) times daily with a meal.) 180 tablet 2 06/15/2021 at 1130  ? Cholecalciferol (VITAMIN D3) 125 MCG (5000 UT) CAPS Take 5,000 Units by mouth at bedtime.   06/14/2021  ? furosemide (LASIX) 20 MG tablet Take 1 tablet (20 mg total) by mouth daily as needed for fluid or edema. Take Lasix 20 mg once daily with an additional dose as needed for fluid overload. (Patient taking differently: Take 20 mg by mouth daily. Take '20mg'$  if needed for fluid overload) 90 tablet 3 06/15/2021 at 0600  ? HYDROcodone-acetaminophen (NORCO) 7.5-325 MG tablet Take 1 tablet by mouth every 6 (six) hours as needed for moderate pain. 20 tablet  0 06/14/2021  ? isosorbide-hydrALAZINE (BIDIL) 20-37.5 MG tablet Take 1 tablet by mouth 3 (three) times daily.   06/15/2021 at 1130  ? latanoprost (XALATAN) 0.005 % ophthalmic solution Place 1 drop into both eyes at bedtime.   06/14/2021  ? levothyroxine (SYNTHROID) 125 MCG tablet Take 125 mcg by mouth daily before breakfast.    06/15/2021 at 0600  ? losartan (COZAAR) 100 MG tablet TAKE ONE TABLET BY MOUTH AT NOON (Patient taking differently: Take 100 mg by mouth daily at 12 noon.) 180 tablet 1 06/14/2021 at 1600  ? Multiple Vitamins-Minerals (ICAPS AREDS 2 PO) Take 1 capsule by mouth 2 (two) times daily.   06/14/2021 at pm  ? omeprazole (PRILOSEC) 20 MG capsule Take 1 capsule (20 mg total) by  mouth daily. 30 capsule 11 06/15/2021 at 1130  ? rOPINIRole (REQUIP) 1 MG tablet Take 1 mg by mouth See admin instructions. Take 1 mg by mouth once a day between 3 PM-4 PM and an additional 1 mg in the evening, if no relief  12 06/15/2021 at 1300  ? albuterol (PROVENTIL) (2.5 MG/3ML) 0.083% nebulizer solution Take 2.5 mg by nebulization every 6 (six) hours as needed for wheezing or shortness of breath. (Patient not taking: Reported on 06/15/2021)   Not Taking  ? diclofenac sodium (VOLTAREN) 1 % GEL Apply 2.25 g topically 3 (three) times daily as needed (for pain).      ? ? ?Assessment: ?88 yo w/ symptomatic anemia.  Pt on apixaban 2.5 mg po BID PTA for PAF. Last dose 3/8 0022 am. ?Pharmacy consulted for heparin bridge while apixaban on hold.  ?1 U PRBC given 3/8. Hg up to 8.8. SCr 1.59 ?For diagnostic EGD in AM.  ? ?Goal of Therapy:  ?Heparin level 0.3-0.7 units/ml ?aPTT 66-102 seconds ?Monitor platelets by anticoagulation protocol: Yes ?  ?Plan:  ?Draw baseline aPTT & heparin level now ?After baseline labs drawn start heparin drip @ 1050 units/hr and check 8 hr aPTT ?Heparin drip off at midnight per MD orders for EGD tomorrow ?Will monitor using aPTTs until apixaban effect wears off and heparin levels correlate ?Daily CBC, aPTT, heparin level ?F/u plans s/p EGD ? ?Eudelia Bunch, Pharm.D ?06/17/2021 12:38 PM ? ? ?

## 2021-06-17 NOTE — Progress Notes (Signed)
PROGRESS NOTE  Lisa Mcclure DPO:[id number] DOB: 1932-08-26 DOA: 06/15/2021 PCP: [name], MD  HPI/Recap of past 24 hours: Lisa Mcclure is a 88 y.o. female with medical history significant for HFpEF (EF 55-60% 04/20/2021), paroxysmal atrial fibrillation on Eliquis, CKD stage IIIb, HTN, hypothyroidism, small airway disease, anemia, venous insufficiency, AAA s/p endovascular stent graft (12/2019), and OSA intolerant to CPAP using supplemental O2 via [place] 2L qhs who presented to the ED for evaluation of worsening exertional dyspnea, fatigue and generalized weakness.  She has been less mobile than usual due to issues with her chronic osteoarthritis resulting in hip pain as well as stating that she pulled her groin recently.  Work-up revealed symptomatic anemia with hemoglobin of 7.4, severe iron deficiency, she is post 1 unit PRBC transfusion.  Seen by GI, will plan for EGD on Friday.  Eliquis held 06/16/2021. Heparin drip for anticoagulation coverage, to stop after midnight as recommended by GI.  Received IV iron on 06/16/2021 due to severe iron deficiency and to improve exercise tolerance.    06/17/2021: Patient was seen and examined at bedside.  Reports her cough and dyspnea are better.  She has no other complaints.  Hemoglobin uptrending 8.8 from 8.3.  Assessment/Plan: Principal Problem:   Acute respiratory failure with hypoxia (HCC) Active Problems:   Symptomatic anemia   Acute renal failure superimposed on stage 3b chronic kidney disease (HCC)   Chronic heart failure with preserved ejection fraction (HFpEF) (HCC)   Paroxysmal atrial fibrillation (HCC)   OSA (obstructive sleep apnea)   Hypertension   Hypothyroidism   RLS (restless legs syndrome)   Generalized weakness  Resolved acute respiratory failure with hypoxia ([place]) secondary to atelectasis Patient prior to admission with exertional dyspnea with SPO2 as low as 86% while on 2 L O2 via [place].   Likely multifactorial related to  symptomatic anemia, atelectasis, sleep apnea, obesity, and deconditioning.   CXR without evidence of pneumonia or pulmonary edema. O2 saturation 93% on ambient air. DuoNeb scheduled and as needed Continue incentive spirometer Mobilize as tolerated   Symptomatic anemia Hemoglobin 7.4 compared to usual baseline between 9-10.  She reports adherence to Eliquis and has not had any obvious bleeding.  FOBT was negative.  Anemia panel consistent with iron deficiency. Hemoglobin 8.3 post 1 unit PRBC transfusion. Repeat hemoglobin 8.8 on 06/17/2021. No overt bleeding.  Severe iron deficiency, rule out GI as a source Iron studies done on 06/15/2021 IV Feraheme x1 dose on 06/16/21 We will start oral ferrous sulfate after EGD. EGD planned on Friday, 06/18/2021.  Paroxysmal atrial fibrillation (HCC) Stable with controlled rate and rhythm on admission. -Continue amiodarone, Coreg Eliquis on hold since 06/16/2021 Heparin drip, stop after midnight in anticipation for EGD on 06/18/2021   Acute renal failure superimposed on stage 3b chronic kidney disease ([place]) Creatinine 2.00 on admission.  Baseline labs show variable creatinine, usually between 1.2-1.5.   AKI resolved, back to baseline creatinine 1.5 with GFR 31. IV fluid DC'd on 06/16/21 to avoid volume overload with elevated BNP greater than 300.  Chronic heart failure with preserved ejection fraction (HFpEF) ([place]) Last EF 55-60% by TTE 04/20/2021.  BNP greater than 300 Monitor volume status Continue strict I's and O's and daily weight    Generalized weakness Secondary to symptomatic anemia and chronic osteoarthritis with deconditioning. PT OT consulted and recommended home health PT OT. Continue to mobilize as tolerated with fall precautions   RLS (restless legs syndrome) Continue home Requip.   Hypothyroidism Continue  home Synthroid.   Hypertension Blood pressure on the lower side on admission. -Continue home Coreg Restarted home Bidil,  Coreg, amiodarone. Hold off losartan and Lasix to avoid hypotension and also due to recent AKI. Continue to closely monitor vital signs  Hyperlipidemia Resume home regimen.   OSA (obstructive sleep apnea) History of OSA intolerant to CPAP.  Has not been wearing oxygen at night regularly. -Continue supplemental oxygen nightly and as needed during the day     DVT prophylaxis: Heparin drip Code Status: DNR, confirmed with patient on admission. Family Communication: None at bedside.    Disposition Plan: From home, likely home with home health PT OT once GI signs off.  Consults called: GI.  Severity of Illness:   Inpatient status.  Patient requires at least 2 midnights for further evaluation and treatment of present condition.  Objective: Vitals:   06/17/21 0600 06/17/21 0900 06/17/21 1334 06/17/21 1535  BP:   (!) 97/45   Pulse:   (!) 56   Resp:  '20 18 14  '$ Temp:   (!) 97.4 F (36.3 C)   TempSrc:   Oral   SpO2:  99% 93% 93%  Weight: 98.4 kg     Height:        Intake/Output Summary (Last 24 hours) at 06/17/2021 1557 Last data filed at 06/17/2021 1100 Gross per 24 hour  Intake 480 ml  Output 1300 ml  Net -820 ml   Filed Weights   06/15/21 1620 06/15/21 2351 06/17/21 0600  Weight: 95.3 kg 95.8 kg 98.4 kg    Exam:  General: 88 y.o. year-old female frail-appearing in no acute distress.  She is alert oriented x3.   Cardiovascular: Regular rate and rhythm no rubs gallops.   Respiratory: Clear to auscultation no wheezes or rales.   Abdomen: Soft nontender bowel sounds present. Musculoskeletal: Trace lower extremity edema bilaterally. Skin: No ulcerative lesions noted. Psychiatry: Mood is appropriate for condition and setting. Neuro: Nonfocal exam.   Data Reviewed: CBC: Recent Labs  Lab 06/15/21 1712 06/16/21 0644 06/17/21 0525  WBC 7.3 5.2 8.5  NEUTROABS 5.3  --   --   HGB 7.4* 8.3* 8.8*  HCT 24.8* 26.6* 28.8*  MCV 88.6 86.1 87.3  PLT 392 403* 656   Basic  Metabolic Panel: Recent Labs  Lab 06/15/21 1712 06/16/21 0644  NA 137 136  K 4.2 4.0  CL 93* 95*  CO2 33* 32  GLUCOSE 98 240*  BUN 52* 50*  CREATININE 2.00* 1.59*  CALCIUM 8.7* 8.7*   GFR: Estimated Creatinine Clearance: 28.4 mL/min (A) (by C-G formula based on SCr of 1.59 mg/dL (H)). Liver Function Tests: No results for input(s): AST, ALT, ALKPHOS, BILITOT, PROT, ALBUMIN in the last 168 hours. No results for input(s): LIPASE, AMYLASE in the last 168 hours. No results for input(s): AMMONIA in the last 168 hours. Coagulation Profile: No results for input(s): INR, PROTIME in the last 168 hours. Cardiac Enzymes: No results for input(s): CKTOTAL, CKMB, CKMBINDEX, TROPONINI in the last 168 hours. BNP (last 3 results) No results for input(s): PROBNP in the last 8760 hours. HbA1C: No results for input(s): HGBA1C in the last 72 hours. CBG: Recent Labs  Lab 06/16/21 1751  GLUCAP 127*   Lipid Profile: No results for input(s): CHOL, HDL, LDLCALC, TRIG, CHOLHDL, LDLDIRECT in the last 72 hours. Thyroid Function Tests: No results for input(s): TSH, T4TOTAL, FREET4, T3FREE, THYROIDAB in the last 72 hours. Anemia Panel: Recent Labs    06/15/21 2022  VITAMINB12 300  FOLATE 10.6  FERRITIN 13  TIBC 536*  IRON 15*  RETICCTPCT 2.2   Urine analysis:    Component Value Date/Time   COLORURINE STRAW (A) 09/13/2018 1740   APPEARANCEUR CLEAR 09/13/2018 1740   LABSPEC 1.008 09/13/2018 1740   PHURINE 7.0 09/13/2018 1740   GLUCOSEU NEGATIVE 09/13/2018 1740   HGBUR NEGATIVE 09/13/2018 1740   BILIRUBINUR NEGATIVE 09/13/2018 1740   KETONESUR NEGATIVE 09/13/2018 1740   PROTEINUR NEGATIVE 09/13/2018 1740   UROBILINOGEN 0.2 01/06/2009 0808   NITRITE NEGATIVE 09/13/2018 1740   LEUKOCYTESUR NEGATIVE 09/13/2018 1740   Sepsis Labs: '@LABRCNTIP'$ (procalcitonin:4,lacticidven:4)  ) Recent Results (from the past 240 hour(s))  Resp Panel by RT-PCR (Flu A&B, Covid) Nasopharyngeal Swab      Status: None   Collection Time: 06/15/21  5:16 PM   Specimen: Nasopharyngeal Swab; Nasopharyngeal(NP) swabs in vial transport medium  Result Value Ref Range Status   SARS Coronavirus 2 by RT PCR NEGATIVE NEGATIVE Final    Comment: (NOTE) SARS-CoV-2 target nucleic acids are NOT DETECTED.  The SARS-CoV-2 RNA is generally detectable in upper respiratory specimens during the acute phase of infection. The lowest concentration of SARS-CoV-2 viral copies this assay can detect is 138 copies/mL. A negative result does not preclude SARS-Cov-2 infection and should not be used as the sole basis for treatment or other patient management decisions. A negative result may occur with  improper specimen collection/handling, submission of specimen other than nasopharyngeal swab, presence of viral mutation(s) within the areas targeted by this assay, and inadequate number of viral copies(<138 copies/mL). A negative result must be combined with clinical observations, patient history, and epidemiological information. The expected result is Negative.  Fact Sheet for Patients:  EntrepreneurPulse.com.au  Fact Sheet for Healthcare Providers:  IncredibleEmployment.be  This test is no t yet approved or cleared by the [place] FDA and  has been authorized for detection and/or diagnosis of SARS-CoV-2 by FDA under an Emergency Use Authorization (EUA). This EUA will remain  in effect (meaning this test can be used) for the duration of the COVID-19 declaration under Section 564(b)(1) of the Act, 21 U.S.C.section 360bbb-3(b)(1), unless the authorization is terminated  or revoked sooner.       Influenza A by PCR NEGATIVE NEGATIVE Final   Influenza B by PCR NEGATIVE NEGATIVE Final    Comment: (NOTE) The Xpert Xpress SARS-CoV-2/FLU/RSV plus assay is intended as an aid in the diagnosis of influenza from Nasopharyngeal swab specimens and should not be used as a sole basis  for treatment. Nasal washings and aspirates are unacceptable for Xpert Xpress SARS-CoV-2/FLU/RSV testing.  Fact Sheet for Patients: EntrepreneurPulse.com.au  Fact Sheet for Healthcare Providers: IncredibleEmployment.be  This test is not yet approved or cleared by the [place] FDA and has been authorized for detection and/or diagnosis of SARS-CoV-2 by FDA under an Emergency Use Authorization (EUA). This EUA will remain in effect (meaning this test can be used) for the duration of the COVID-19 declaration under Section 564(b)(1) of the Act, 21 U.S.C. section 360bbb-3(b)(1), unless the authorization is terminated or revoked.  Performed at [facility], [place] [address], [place], [place] [address]       Studies: No results found.  Scheduled Meds:  amiodarone  200 mg Oral Daily   carvedilol  12.5 mg Oral BID WC   cholecalciferol  5,000 Units Oral Daily   ezetimibe  10 mg Oral Daily   ipratropium-albuterol  3 mL Nebulization Q6H   isosorbide-hydrALAZINE  1 tablet Oral TID  latanoprost  1 drop Both Eyes QHS   levothyroxine  125 mcg Oral QAC breakfast   multivitamin with minerals  1 tablet Oral Daily   pantoprazole  40 mg Oral Daily   rOPINIRole  1 mg Oral Daily   sodium chloride flush  3 mL Intravenous Q12H    Continuous Infusions:  sodium chloride     heparin 1,050 Units/hr (06/17/21 1322)     LOS: 1 day     Kayleen Memos, MD [facility] Pager [phone]  If 7PM-7AM, please contact night-coverage [email or web address] Password [facility] 06/17/2021, 3:57 PM

## 2021-06-17 NOTE — Consult Note (Signed)
CARDIOLOGY CONSULT NOTE  [name] MRN: [id number] DOB/AGE: 10/11/1932 88 y.o.  Admit date: 06/15/2021 Referring Physician: Kayleen Memos, DO Primary Physician:  [name], MD Reason for Consultation: shortness of breath   [name], female    DOB: 1932/10/19, 88 y.o.   MRN: [id number]  Chief Complaint  Patient presents with   Shortness of Breath   HPI:    Lisa Mcclure  is a 88 y.o. female  with past medical history of obesity, chronic diastolic CHF, hypertensive heart disease, paroxysmal atrial fibrillation, anemia, hemorrhoids, diverticulosis, esophageal reflux, depression, OSA intolerant to CPAP on nocturnal 02, rheumatoid arthritis, and venous insufficiency. CT scan of the chest 03/2016 without definitive ILD. LM and 2 vessel CAD, mild calcification on mitral and aortic valve. S/p AAA repair on 12/20/2019.   Patient is complaining of worsening shortness of breath and fatigue over the last few weeks, she was seen in the office 06/09/2021 at which time diuretic therapy was uptitrated.  She subsequently presented to [facility] long emergency department 06/15/2020.  Evaluation in the ED revealed symptomatic anemia with hemoglobin 7.4 presentation (patient baseline is 9-10), chest x-ray with questionable atelectasis, BNP 344.  Patient was transfused a unit of packed red blood cells and GI was consulted, planning for EGD Friday morning.  Patient's Eliquis is currently being held.  Patient has also had episodes of PVCs which are asymptomatic, and were previously identified on ambulatory cardiac telemetry.  At presentation patient also with acute on chronic kidney injury likely due to significant anemia as well as diuresis.  Patient is seen and examined at the bedside today at approximately 4:15 PM. She is resting comfortable with oxygen saturating well on room air.  Patient reports improvement in bilateral leg edema since last office visit.  Denies  orthopnea.  Dyspnea has significantly improved since admission.  Patient states she walked in the hall without issue earlier today.  Past Medical History:  Diagnosis Date   Anemia    h/o hemorrhoidal bleeding and blood transfusion   Cardiomegaly    Chronic diastolic CHF (congestive heart failure) (HCC)    Depression    Diverticulosis    DOE (dyspnea on exertion)    Esophageal reflux    GERD (gastroesophageal reflux disease)    Hypothyroidism    Insomnia    LBP (low back pain)    OAB (overactive bladder)    Obesity    OSA (obstructive sleep apnea)    Osteoarthritis    Paroxysmal atrial fibrillation (HCC)    Rheumatoid arthritis(714.0)    Shoulder pain, bilateral    Unspecified essential hypertension    Venous insufficiency    Past Surgical History:  Procedure Laterality Date   ABDOMINAL AORTIC ENDOVASCULAR STENT GRAFT N/A 12/20/2019   Procedure: Aortogram including catheter selection of aorta and bilateral iliac arteriogram, Endovascular repair of infrarenal abdominal aortic aneurysm with bifurcated stent graft (26 mm x 14 x 12 main body, right bell bottom with a 20 mm x 10 cm piece, and left bell bottom with a 16 mm x 12 cm piece) ;  Surgeon: Marty Heck, MD;  Location: [place];  Service: Vascular;  Laterality: N/A;   APPENDECTOMY  1953   bladder abduction-1996  1996   breast biopsy Right 1980   CARDIOVERSION N/A 12/24/2019   Procedure: CARDIOVERSION;  Surgeon: Adrian Prows, MD;  Location: [place];  Service: Cardiovascular;  Laterality: N/A;   [place],  [place]     FLEXIBLE SIGMOIDOSCOPY N/A 04/10/2015   Procedure: FLEXIBLE SIGMOIDOSCOPY;  Surgeon: Arta Silence, MD;  Location: [facility] ENDOSCOPY;  Service: Endoscopy;  Laterality: N/A;   knee arthroscopy Right 1996, 2010   KNEE ARTHROSCOPY W/ AUTOGENOUS CARTILAGE IMPLANTATION (ACI) PROCEDURE Left 1994, 1995   REFRACTIVE SURGERY  01/2020   SHOULDER SURGERY   1990   TEE WITHOUT CARDIOVERSION N/A 12/24/2019   Procedure: TRANSESOPHAGEAL ECHOCARDIOGRAM (TEE);  Surgeon: Adrian Prows, MD;  Location: [place];  Service: Cardiovascular;  Laterality: N/A;   [place] Bilateral 12/20/2019   Procedure: Ultrasound-guided access of bilateral common femoral arteries for delivery of endograft and percutaneous closure;  Surgeon: Marty Heck, MD;  Location: [facility] OR;  Service: Vascular;  Laterality: Bilateral;   VESICOVAGINAL FISTULA CLOSURE W/ TAH     WRIST SURGERY  1967   Family History  Problem Relation Age of Onset   COPD Father    Lung cancer Mother    Heart attack Maternal Grandmother 67   Breast cancer Maternal Aunt    Lung cancer Son    Social History   Tobacco Use   Smoking status: Former    Packs/day: 0.20    Years: 10.00    Pack years: 2.00    Types: Cigarettes    Quit date: 04/11/1984    Years since quitting: 37.2   Smokeless tobacco: Never  Substance Use Topics   Alcohol use: Yes    Alcohol/week: 0.0 standard drinks    Comment: cocktail once a week    Marital Sttus: Widowed  ROS  Review of Systems  Constitutional: Positive for malaise/fatigue (improving). Negative for weight gain.  Cardiovascular:  Positive for dyspnea on exertion (improving) and leg swelling (improved). Negative for chest pain, claudication, near-syncope, orthopnea, palpitations, paroxysmal nocturnal dyspnea and syncope.  Respiratory:  Negative for hemoptysis.   Musculoskeletal:  Positive for back pain.  Gastrointestinal:  Negative for hematochezia and melena.  Neurological:  Negative for dizziness.  Objective   Vitals with BMI 06/17/2021 06/17/2021 06/17/2021  Height - - -  Weight - 216 lbs 15 oz -  BMI - 82.95 -  Systolic 97 - 621  Diastolic 45 - 69  Pulse 56 - 59    Blood pressure (!) 97/45, pulse (!) 56, temperature (!) 97.4 F (36.3 C), temperature source Oral, resp. rate 14, height 5'  3" (1.6 m), weight 98.4 kg, SpO2 93 %.    Physical Exam Vitals reviewed.  Cardiovascular:     Rate and Rhythm: Normal rate and regular rhythm.     Pulses: Intact distal pulses.     Heart sounds: S1 normal and S2 normal. Murmur heard.  Harsh midsystolic murmur is present at the upper right sternal border.    No gallop.  Pulmonary:     Effort: Pulmonary effort is normal. No respiratory distress.     Breath sounds: No wheezing, rhonchi or rales.  Musculoskeletal:     Right lower leg: Edema (minimal) present.     Left lower leg: Edema (minimal) present.  Skin:    General: Skin is warm and dry.  Neurological:     Mental Status: She is alert.   Laboratory examination:   Recent Labs    11/12/20 1555 06/15/21 1712 06/16/21 0644  NA 140 137 136  K 4.8 4.2 4.0  CL 100 93* 95*  CO2 27 33* 32  GLUCOSE 93 98 240*  BUN 43* 52* 50*  CREATININE 1.53* 2.00* 1.59*  CALCIUM 8.8 8.7* 8.7*  GFRNONAA  --  24* 31*   estimated creatinine clearance is 28.4 mL/min (A) (by C-G formula based on SCr of 1.59 mg/dL (H)).  CMP Latest Ref Rng & Units 06/16/2021 06/15/2021 11/12/2020  Glucose 70 - 99 mg/dL 240(H) 98 93  BUN 8 - 23 mg/dL 50(H) 52(H) 43(H)  Creatinine 0.44 - 1.00 mg/dL 1.59(H) 2.00(H) 1.53(H)  Sodium 135 - 145 mmol/L 136 137 140  Potassium 3.5 - 5.1 mmol/L 4.0 4.2 4.8  Chloride 98 - 111 mmol/L 95(L) 93(L) 100  CO2 22 - 32 mmol/L 32 33(H) 27  Calcium 8.9 - 10.3 mg/dL 8.7(L) 8.7(L) 8.8  Total Protein 6.5 - 8.1 g/dL - - -  Total Bilirubin 0.3 - 1.2 mg/dL - - -  Alkaline Phos 38 - 126 U/L - - -  AST 15 - 41 U/L - - -  ALT 0 - 44 U/L - - -   CBC Latest Ref Rng & Units 06/17/2021 06/16/2021 06/15/2021  WBC 4.0 - 10.5 K/uL 8.5 5.2 7.3  Hemoglobin 12.0 - 15.0 g/dL 8.8(L) 8.3(L) 7.4(L)  Hematocrit 36.0 - 46.0 % 28.8(L) 26.6(L) 24.8(L)  Platelets 150 - 400 K/uL 400 403(H) 392   Lipid Panel No results for input(s): CHOL, TRIG, LDLCALC, VLDL, HDL, CHOLHDL, LDLDIRECT in the last 8760 hours.   HEMOGLOBIN A1C No results found for: HGBA1C, MPG TSH No results for input(s): TSH in the last 8760 hours. BNP (last 3 results) Recent Labs    10/28/20 1619 06/15/21 1718  BNP 356.5* 344.1*   Results for orders placed or performed during the hospital encounter of 06/15/21 (from the past 48 hour(s))  CBC with Differential     Status: Abnormal   Collection Time: 06/15/21  5:12 PM  Result Value Ref Range   WBC 7.3 4.0 - 10.5 K/uL   RBC 2.80 (L) 3.87 - 5.11 MIL/uL   Hemoglobin 7.4 (L) 12.0 - 15.0 g/dL   HCT 24.8 (L) 36.0 - 46.0 %   MCV 88.6 80.0 - 100.0 fL   MCH 26.4 26.0 - 34.0 pg   MCHC 29.8 (L) 30.0 - 36.0 g/dL   RDW 16.7 (H) 11.5 - 15.5 %   Platelets 392 150 - 400 K/uL   nRBC 0.0 0.0 - 0.2 %   Neutrophils Relative % 72 %   Neutro Abs 5.3 1.7 - 7.7 K/uL   Lymphocytes Relative 11 %   Lymphs Abs 0.8 0.7 - 4.0 K/uL   Monocytes Relative 12 %   Monocytes Absolute 0.9 0.1 - 1.0 K/uL   Eosinophils Relative 3 %   Eosinophils Absolute 0.2 0.0 - 0.5 K/uL   Basophils Relative 1 %   Basophils Absolute 0.1 0.0 - 0.1 K/uL   Immature Granulocytes 1 %   Abs Immature Granulocytes 0.04 0.00 - 0.07 K/uL    Comment: Performed at [facility], [place] [address], [place], [place] [address]  Basic metabolic panel     Status: Abnormal   Collection Time: 06/15/21  5:12 PM  Result Value Ref Range   Sodium 137 135 - 145 mmol/L   Potassium 4.2 3.5 - 5.1 mmol/L   Chloride 93 (L) 98 - 111 mmol/L   CO2 33 (H) 22 - 32 mmol/L   Glucose, Bld 98 70 - 99 mg/dL    Comment: Glucose reference range applies only to samples taken after fasting for at least 8  hours.   BUN 52 (H) 8 - 23 mg/dL   Creatinine, Ser 2.00 (H) 0.44 - 1.00 mg/dL   Calcium 8.7 (L) 8.9 - 10.3 mg/dL   GFR, Estimated 24 (L) >60 mL/min    Comment: (NOTE) Calculated using the CKD-EPI Creatinine Equation (2021)    Anion gap 11 5 - 15    Comment: Performed at [facility], [place] [address]  [place], [place] [address]  Resp Panel by RT-PCR (Flu A&B, Covid) Nasopharyngeal Swab     Status: None   Collection Time: 06/15/21  5:16 PM   Specimen: Nasopharyngeal Swab; Nasopharyngeal(NP) swabs in vial transport medium  Result Value Ref Range   SARS Coronavirus 2 by RT PCR NEGATIVE NEGATIVE    Comment: (NOTE) SARS-CoV-2 target nucleic acids are NOT DETECTED.  The SARS-CoV-2 RNA is generally detectable in upper respiratory specimens during the acute phase of infection. The lowest concentration of SARS-CoV-2 viral copies this assay can detect is 138 copies/mL. A negative result does not preclude SARS-Cov-2 infection and should not be used as the sole basis for treatment or other patient management decisions. A negative result may occur with  improper specimen collection/handling, submission of specimen other than nasopharyngeal swab, presence of viral mutation(s) within the areas targeted by this assay, and inadequate number of viral copies(<138 copies/mL). A negative result must be combined with clinical observations, patient history, and epidemiological information. The expected result is Negative.  Fact Sheet for Patients:  EntrepreneurPulse.com.au  Fact Sheet for Healthcare Providers:  IncredibleEmployment.be  This test is no t yet approved or cleared by the [place] FDA and  has been authorized for detection and/or diagnosis of SARS-CoV-2 by FDA under an Emergency Use Authorization (EUA). This EUA will remain  in effect (meaning this test can be used) for the duration of the COVID-19 declaration under Section 564(b)(1) of the Act, 21 U.S.C.section 360bbb-3(b)(1), unless the authorization is terminated  or revoked sooner.       Influenza A by PCR NEGATIVE NEGATIVE   Influenza B by PCR NEGATIVE NEGATIVE    Comment: (NOTE) The Xpert Xpress SARS-CoV-2/FLU/RSV plus assay is intended as an aid in the diagnosis of influenza from  Nasopharyngeal swab specimens and should not be used as a sole basis for treatment. Nasal washings and aspirates are unacceptable for Xpert Xpress SARS-CoV-2/FLU/RSV testing.  Fact Sheet for Patients: EntrepreneurPulse.com.au  Fact Sheet for Healthcare Providers: IncredibleEmployment.be  This test is not yet approved or cleared by the [place] FDA and has been authorized for detection and/or diagnosis of SARS-CoV-2 by FDA under an Emergency Use Authorization (EUA). This EUA will remain in effect (meaning this test can be used) for the duration of the COVID-19 declaration under Section 564(b)(1) of the Act, 21 U.S.C. section 360bbb-3(b)(1), unless the authorization is terminated or revoked.  Performed at [facility], [place] [address] [place], [place] [address]   Brain natriuretic peptide     Status: Abnormal   Collection Time: 06/15/21  5:18 PM  Result Value Ref Range   B Natriuretic Peptide 344.1 (H) 0.0 - 100.0 pg/mL    Comment: Performed at [facility], [place] [address] [place], [place] [address]  D-dimer, quantitative     Status: Abnormal   Collection Time: 06/15/21  5:45 PM  Result Value Ref Range   D-Dimer, Quant 0.64 (H) 0.00 - 0.50 ug/mL-FEU    Comment: (NOTE) At the manufacturer cut-off value of 0.5 g/mL FEU, this assay has a negative  predictive value of 95-100%.This assay is intended for use in conjunction with a clinical pretest probability (PTP) assessment model to exclude pulmonary embolism (PE) and deep venous thrombosis (DVT) in outpatients suspected of PE or DVT. Results should be correlated with clinical presentation. Performed at [facility], [place] [address] [place], [place] [address]   POC occult blood, ED Provider will collect     Status: None   Collection Time: 06/15/21  7:32 PM  Result Value Ref Range   Fecal Occult Bld NEGATIVE NEGATIVE  Vitamin B12      Status: None   Collection Time: 06/15/21  8:22 PM  Result Value Ref Range   Vitamin B-12 300 180 - 914 pg/mL    Comment: (NOTE) This assay is not validated for testing neonatal or myeloproliferative syndrome specimens for Vitamin B12 levels. Performed at [facility] [place] [address] [place], [place] [address]   Folate     Status: None   Collection Time: 06/15/21  8:22 PM  Result Value Ref Range   Folate 10.6 >5.9 ng/mL    Comment: Performed at [facility], [place] [address] [place], [place] [address]  Iron and TIBC     Status: Abnormal   Collection Time: 06/15/21  8:22 PM  Result Value Ref Range   Iron 15 (L) 28 - 170 ug/dL   TIBC 536 (H) 250 - 450 ug/dL   Saturation Ratios 3 (L) 10.4 - 31.8 %   UIBC 521 ug/dL    Comment: Performed at [facility], [place] [address] [place], [place], [place] [address]  Ferritin     Status: None   Collection Time: 06/15/21  8:22 PM  Result Value Ref Range   Ferritin 13 11 - 307 ng/mL    Comment: Performed at [facility], [place] [address] [place], [place] [address]  Reticulocytes     Status: Abnormal   Collection Time: 06/15/21  8:22 PM  Result Value Ref Range   Retic Ct Pct 2.2 0.4 - 3.1 %   RBC. 2.98 (L) 3.87 - 5.11 MIL/uL   Retic Count, Absolute 65.0 19.0 - 186.0 K/uL   Immature Retic Fract 27.7 (H) 2.3 - 15.9 %    Comment: Performed at [facility], [place] [address], [place], [place] [address]  Type and screen [place]     Status: None   Collection Time: 06/15/21 10:10 PM  Result Value Ref Range   ABO/RH(D) B POS    Antibody Screen NEG    Sample Expiration 06/18/2021,2359    Unit Number [id number]    Blood Component Type RED CELLS,LR    Unit division 00    Status of Unit ISSUED,FINAL    Transfusion Status OK TO TRANSFUSE    Crossmatch Result      Compatible Performed at [facility], [place] [address],  [place], [place] [address]   Prepare RBC (crossmatch)     Status: None   Collection Time: 06/15/21 10:10 PM  Result Value Ref Range   Order Confirmation      ORDER PROCESSED BY BLOOD BANK Performed at [place] [address], [place], [place] [address]   Basic metabolic panel     Status: Abnormal   Collection Time: 06/16/21  6:44 AM  Result Value Ref Range   Sodium 136 135 - 145 mmol/L   Potassium 4.0 3.5 - 5.1 mmol/L   Chloride 95 (L) 98 - 111  mmol/L   CO2 32 22 - 32 mmol/L   Glucose, Bld 240 (H) 70 - 99 mg/dL    Comment: Glucose reference range applies only to samples taken after fasting for at least 8 hours.   BUN 50 (H) 8 - 23 mg/dL   Creatinine, Ser 1.59 (H) 0.44 - 1.00 mg/dL   Calcium 8.7 (L) 8.9 - 10.3 mg/dL   GFR, Estimated 31 (L) >60 mL/min    Comment: (NOTE) Calculated using the CKD-EPI Creatinine Equation (2021)    Anion gap 9 5 - 15    Comment: Performed at [facility], [place] [address], [place], [place] [address]  CBC     Status: Abnormal   Collection Time: 06/16/21  6:44 AM  Result Value Ref Range   WBC 5.2 4.0 - 10.5 K/uL   RBC 3.09 (L) 3.87 - 5.11 MIL/uL   Hemoglobin 8.3 (L) 12.0 - 15.0 g/dL   HCT 26.6 (L) 36.0 - 46.0 %   MCV 86.1 80.0 - 100.0 fL   MCH 26.9 26.0 - 34.0 pg   MCHC 31.2 30.0 - 36.0 g/dL   RDW 17.1 (H) 11.5 - 15.5 %   Platelets 403 (H) 150 - 400 K/uL   nRBC 0.0 0.0 - 0.2 %    Comment: Performed at [facility], [place] [address], [place], [place] [address]  Glucose, capillary     Status: Abnormal   Collection Time: 06/16/21  5:51 PM  Result Value Ref Range   Glucose-Capillary 127 (H) 70 - 99 mg/dL    Comment: Glucose reference range applies only to samples taken after fasting for at least 8 hours.  CBC     Status: Abnormal   Collection Time: 06/17/21  5:25 AM  Result Value Ref Range   WBC 8.5 4.0 - 10.5 K/uL   RBC 3.30 (L) 3.87 - 5.11 MIL/uL   Hemoglobin 8.8 (L) 12.0 - 15.0 g/dL   HCT  28.8 (L) 36.0 - 46.0 %   MCV 87.3 80.0 - 100.0 fL   MCH 26.7 26.0 - 34.0 pg   MCHC 30.6 30.0 - 36.0 g/dL   RDW 17.1 (H) 11.5 - 15.5 %   Platelets 400 150 - 400 K/uL   nRBC 0.0 0.0 - 0.2 %    Comment: Performed at [facility], [place] [address] [place], [place] [address]  APTT     Status: None   Collection Time: 06/17/21 12:49 PM  Result Value Ref Range   aPTT 32 24 - 36 seconds    Comment: Performed at [facility], [place] [address] [place], [place] [address]  Heparin level (unfractionated)     Status: None   Collection Time: 06/17/21 12:49 PM  Result Value Ref Range   Heparin Unfractionated 0.67 0.30 - 0.70 IU/mL    Comment: (NOTE) The clinical reportable range upper limit is being lowered to >1.10 to align with the FDA approved guidance for the current laboratory assay.  If heparin results are below expected values, and patient dosage has  been confirmed, suggest follow up testing of antithrombin III levels. Performed at [facility], [place] [address], [place], [place] [address]    Medications and allergies   Allergies  Allergen Reactions   Statins Other (See Comments)    Muscle aches and INTERNAL BLEEDING   Atorvastatin     Other reaction(s): Myalgias   Gabapentin Other (See Comments)    "Made me loopy"   Methocarbamol Other (  See Comments)    "Made me loopy"    Current Meds  Medication Sig   albuterol (VENTOLIN HFA) 108 (90 Base) MCG/ACT inhaler Inhale 2 puffs into the lungs every 6 (six) hours as needed for wheezing or shortness of breath.   amiodarone (PACERONE) 100 MG tablet Take 2 tablets (200 mg total) by mouth daily.   amLODipine (NORVASC) 10 MG tablet TAKE ONE TABLET BY MOUTH AT NOON (Patient taking differently: Take 10 mg by mouth daily at 12 noon.)   apixaban (ELIQUIS) 2.5 MG TABS tablet Take 1 tablet (2.5 mg total) by mouth 2 (two) times daily.   Bempedoic Acid-Ezetimibe (NEXLIZET) 180-10 MG TABS Take 1  capsule by mouth daily. (Patient taking differently: Take 1 capsule by mouth daily. Taking it at bedtime)   carvedilol (COREG) 12.5 MG tablet TAKE ONE TABLET BY MOUTH AT BREAKFAST AND AT BEDTIME (Patient taking differently: Take 12.5 mg by mouth 2 (two) times daily with a meal.)   Cholecalciferol (VITAMIN D3) 125 MCG (5000 UT) CAPS Take 5,000 Units by mouth at bedtime.   furosemide (LASIX) 20 MG tablet Take 1 tablet (20 mg total) by mouth daily as needed for fluid or edema. Take Lasix 20 mg once daily with an additional dose as needed for fluid overload. (Patient taking differently: Take 20 mg by mouth daily. Take '20mg'$  if needed for fluid overload)   HYDROcodone-acetaminophen (NORCO) 7.5-325 MG tablet Take 1 tablet by mouth every 6 (six) hours as needed for moderate pain.   isosorbide-hydrALAZINE (BIDIL) 20-37.5 MG tablet Take 1 tablet by mouth 3 (three) times daily.   latanoprost (XALATAN) 0.005 % ophthalmic solution Place 1 drop into both eyes at bedtime.   levothyroxine (SYNTHROID) 125 MCG tablet Take 125 mcg by mouth daily before breakfast.    losartan (COZAAR) 100 MG tablet TAKE ONE TABLET BY MOUTH AT NOON (Patient taking differently: Take 100 mg by mouth daily at 12 noon.)   Multiple Vitamins-Minerals (ICAPS AREDS 2 PO) Take 1 capsule by mouth 2 (two) times daily.   omeprazole (PRILOSEC) 20 MG capsule Take 1 capsule (20 mg total) by mouth daily.   rOPINIRole (REQUIP) 1 MG tablet Take 1 mg by mouth See admin instructions. Take 1 mg by mouth once a day between 3 PM-4 PM and an additional 1 mg in the evening, if no relief   Scheduled Meds:  amiodarone  200 mg Oral Daily   carvedilol  12.5 mg Oral BID WC   cholecalciferol  5,000 Units Oral Daily   ezetimibe  10 mg Oral Daily   ipratropium-albuterol  3 mL Nebulization Q6H   isosorbide-hydrALAZINE  1 tablet Oral TID   latanoprost  1 drop Both Eyes QHS   levothyroxine  125 mcg Oral QAC breakfast   multivitamin with minerals  1 tablet Oral Daily    pantoprazole  40 mg Oral Daily   rOPINIRole  1 mg Oral Daily   sodium chloride flush  3 mL Intravenous Q12H   Continuous Infusions:  sodium chloride     heparin 1,050 Units/hr (06/17/21 1322)   PRN Meds:.acetaminophen **OR** acetaminophen, albuterol, HYDROcodone-acetaminophen, ondansetron **OR** ondansetron (ZOFRAN) IV, rOPINIRole, senna-docusate   I/O last 3 completed shifts: In: 2100 [P.O.:1560; I.V.:225; Blood:315] Out: 1200 [Urine:1200] Total I/O In: 17 [I.V.:17] Out: 500 [Urine:500]    Radiology:   DG Chest 2 View  Result Date: 06/15/2021 CLINICAL DATA:  Shortness of breath. EXAM: CHEST - 2 VIEW COMPARISON:  December 20, 2019. FINDINGS: Stable cardiomegaly. Minimal bibasilar subsegmental atelectasis  is noted. Bony thorax is unremarkable. IMPRESSION: Minimal bibasilar subsegmental atelectasis.  Mild cardiomegaly. Electronically Signed   By: Marijo Conception M.D.   On: 06/15/2021 17:20    Cardiac Studies:   Lexiscan myoview stress test 12/08/2017: 1. Lexiscan stress test was performed. Exercise capacity was not assessed. Stress symptoms included abdominal pain. Blood pressure was normal. The resting and stress electrocardiogram demonstrated normal sinus rhythm, normal resting conduction, no resting arrhythmias and normal rest repolarization. 2. The overall quality of the study is good. There is no evidence of abnormal lung activity. Stress and rest SPECT images demonstrate homogeneous tracer distribution throughout the myocardium. Gated SPECT imaging reveals normal myocardial thickening and wall motion. The left ventricular ejection fraction was normal (54%). 3. Low risk study   Renal artery duplex 09/27/2017: Hemodynamically significant stenosis bilaterally. Renal length is within normal limits for both kidneys. There is an incidental 3.9x3.4x3.4 cm proximal and mid abdominal aortic aneurysm. Recommend dedicated abdominal aortic duplex scan to further define AAA.   Abdominal  Aortic Duplex  01/01/2019: Moderate dilatation of the abdominal aorta is noted in the proximal, mid and distal aorta. An abdominal aortic aneurysm measuring 4.5 x 4.52 x 4.52 cm is seen.  Recheck in 6 months for stability of the aneurysm.   Ambulatory cardiac telemetry 14 days 03/22/2021 - 04/04/2021: Predominant underlying rhythm was sinus with first-degree AV block.  Patient with 19 brief episodes of SVT which were asymptomatic.  Frequent PVCs with 20.4% burden and episodes of ventricular bigeminy and trigeminy which correlated with patient triggered events.  No evidence of atrial fibrillation, ventricular tachycardia, pauses >3 seconds, or high degree AV block.   Echocardiogram 04/20/2021:  Normal LV systolic function with visual EF 55-60%. Left ventricle cavity  is normal in size. Moderate left ventricular hypertrophy. Normal global  wall motion. Indeterminate diastolic filling pattern.  Left atrial cavity is severely dilated.  Moderate aortic valve stenosis (peak velocity 3.62ms, MG 23.039mG, AVA  (VTI) measures 1.3 cm^2, DI 0.4).  Mild (Grade I) mitral regurgitation.  Mild tricuspid regurgitation. Mild pulmonary hypertension. RVSP measures  42 mmHg.  IVC is dilated with a respiratory response of >50%.  Compared to study 05/27/2020 LVEF remains preserved, G2DD is now  indeterminate, mild LAE is now severe, mild AS is now moderate, mild PHTN  is new finding (RVSP 4276m).   EKG: 06/15/2021: Sinus rhythm at a rate of 58 bpm.  First-degree AV block. Normal axis.  Poor R wave progression, cannot exclude anteroseptal infarct old.  No evidence of ischemia or underlying injury pattern. 06/09/2021: Sinus rhythm at a rate of 50 bpm.  First-degree AV block.  Normal axis.  No evidence of ischemia or underlying injury pattern.  Telemetry:  Normal sinus rhythm with episodes of bradycardia and PVCs  Assessment   CarLULIA SCHRINERs a 88 11[age]. female  with past medical history of obesity, chronic  diastolic CHF, hypertensive heart disease, paroxysmal atrial fibrillation, anemia, hemorrhoids, diverticulosis, esophageal reflux, depression, OSA intolerant to CPAP on nocturnal 02, rheumatoid arthritis, and venous insufficiency. CT scan of the chest 03/2016 without definitive ILD. LM and 2 vessel CAD, mild calcification on mitral and aortic valve. S/p AAA repair on 12/20/2019.    Chronic HFpEF Paroxysmal atrial fibrillation  PVCs  Hypertension  Symptomatic anemia/iron deficiency  Acute respiratory failure  AKI superimposed on CKD stage 3b  Recommendations:   Chronic HFpEF BNP 344 at presentation, will trend Patient's leg edema has improved since recent outpatient visit. Appears mildly volume  overloaded.  Given soft blood pressure and AKI at presentation agree with holding losartan and Lasix. AKI has now resolved, can consider resuming if needed and renal function/hemodynamics allow  Blood pressure has been labile during admission, can continue BiDil 1 tablet p.o. 3 times daily with holding parameters. Continue carvedilol 12.5 mg p.o. twice daily  Paroxysmal atrial fibrillation  This patients CHA2DS2-VASc Score 6 (CHF, HTN, vasc, age, F) and yearly risk of stroke 9.8%. Already ACT monitor in 03/2021 revealed no evidence of paroxysmal atrial fibrillation, but rather frequent PVCs with PVC burden of 20.4% No atrial fibrillation of telemetry.  Continue carvedilol 12.5 mg p.o. twice daily Continue amiodarone 200 mg p.o. daily Given acute symptomatic anemia agree with holding Eliquis.  Resumption of Eliquis pending further evaluation by GI, will defer to primary and GI team  PVCs  Cardiac monitor in 03/2021 revealed 20.4% PVC burden.  Patient was subsequently started on amiodarone Normal sinus rhythm with episodes of bradycardia as well as PVCs. Continue carvedilol and amiodarone as above. Continue to monitor.  Will avoid addition AV nodal blocking agents at this time given advanced age  and episodes of soft blood pressure and bradycardia.   Hypertension  Pressure was soft on admission, however it has been labile since Agree with holding losartan and Lasix given AKI Continue carvedilol and BiDil with holding parameters  Agree with holding amlodipine Continue to monitor closely  Symptomatic anemia/iron deficiency  EGD planned for Friday morning Agree with holding Eliquis in the setting of symptomatic anemia and upcoming EGD.  Resumption of anticoagulation recommendations pending further evaluation Will defer management to primary team  Acute respiratory failure  Likely multifactorial including anemia, atelectasis, sleep apnea, obesity, deconditioning.  Likely that underlying HFpEF is also contributing, particularly to chronic dyspnea.  Will defer management to primary team, could consider resuming diuretics as needed if hemodynamics/renal function allows  AKI superimposed on CKD stage 3b Creatinine on admission 2.0, improved today to baseline 1.59 Likely secondary to anemia as well as diuresis Continue to monitor both renal function and volume status closely, patient may need gentle diuresis given underlying HFpEF  Would reccommended discharge with home losartan and Lasix 40 mg p.o. daily if renal function and blood pressure allow. Could consider transition to [facility] outpatient if renal function and hemodynamics allow. Cardiology will sign off and plan to follow up outpatient.   Patient was seen in collaboration with Dr. Einar Gip. He also reviewed patient's chart and examined the patient. Dr. Einar Gip is in agreement of the plan.    Alethia Berthold, PA-C 06/17/2021, 4:31 PM Office: [phone]

## 2021-06-17 NOTE — Progress Notes (Addendum)
Spoke with patient at bedside.  She is agreeable to home health, choice is [place].  States she'd had them in the past.   ? ?Reached out to [facility] rep for acceptance and currently pending a response.  ? ?Addended: Cory from [facility] states they can accept patient.  [facility] contact information added to discharge paperwork.  ?

## 2021-06-17 NOTE — H&P (View-Only) (Signed)
Subjective: Patient states her last bowel movement was yesterday, denies noticing blood in stool or black stools. Denies abdominal pain, nausea or vomiting. Is not on oxygen anymore. However continues to feel weak and complains of back pain(she was scheduled for an epidural injection which had to be canceled as patient got admitted)  Objective: Vital signs in last 24 hours: Temp:  [97.5 F (36.4 C)-98 F (36.7 C)] 97.5 F (36.4 C) (03/09 0457) Pulse Rate:  [59-67] 59 (03/09 0457) Resp:  [15-20] 20 (03/09 0900) BP: (101-153)/(48-69) 153/69 (03/09 0457) SpO2:  [95 %-99 %] 99 % (03/09 0900) FiO2 (%):  [28 %] 28 % (03/09 0900) Weight:  [98.4 kg] 98.4 kg (03/09 0600) Weight change: 3.145 kg Last BM Date : 06/15/21  PE: Elderly, overweight, pale GENERAL: Not in distress, able to speak in full sentences ABDOMEN: Nondistended, nontender, normoactive bowel sounds EXTREMITIES: Mild bilateral erythema noted in lower legs, mild pitting pedal edema  Lab Results: Results for orders placed or performed during the hospital encounter of 06/15/21 (from the past 48 hour(s))  CBC with Differential     Status: Abnormal   Collection Time: 06/15/21  5:12 PM  Result Value Ref Range   WBC 7.3 4.0 - 10.5 K/uL   RBC 2.80 (L) 3.87 - 5.11 MIL/uL   Hemoglobin 7.4 (L) 12.0 - 15.0 g/dL   HCT 24.8 (L) 36.0 - 46.0 %   MCV 88.6 80.0 - 100.0 fL   MCH 26.4 26.0 - 34.0 pg   MCHC 29.8 (L) 30.0 - 36.0 g/dL   RDW 16.7 (H) 11.5 - 15.5 %   Platelets 392 150 - 400 K/uL   nRBC 0.0 0.0 - 0.2 %   Neutrophils Relative % 72 %   Neutro Abs 5.3 1.7 - 7.7 K/uL   Lymphocytes Relative 11 %   Lymphs Abs 0.8 0.7 - 4.0 K/uL   Monocytes Relative 12 %   Monocytes Absolute 0.9 0.1 - 1.0 K/uL   Eosinophils Relative 3 %   Eosinophils Absolute 0.2 0.0 - 0.5 K/uL   Basophils Relative 1 %   Basophils Absolute 0.1 0.0 - 0.1 K/uL   Immature Granulocytes 1 %   Abs Immature Granulocytes 0.04 0.00 - 0.07 K/uL    Comment: Performed  at [facility], [place] [address] [place], [place] [address]  Basic metabolic panel     Status: Abnormal   Collection Time: 06/15/21  5:12 PM  Result Value Ref Range   Sodium 137 135 - 145 mmol/L   Potassium 4.2 3.5 - 5.1 mmol/L   Chloride 93 (L) 98 - 111 mmol/L   CO2 33 (H) 22 - 32 mmol/L   Glucose, Bld 98 70 - 99 mg/dL    Comment: Glucose reference range applies only to samples taken after fasting for at least 8 hours.   BUN 52 (H) 8 - 23 mg/dL   Creatinine, Ser 2.00 (H) 0.44 - 1.00 mg/dL   Calcium 8.7 (L) 8.9 - 10.3 mg/dL   GFR, Estimated 24 (L) >60 mL/min    Comment: (NOTE) Calculated using the CKD-EPI Creatinine Equation (2021)    Anion gap 11 5 - 15    Comment: Performed at [facility], [place] [address] [place], [place] [address]  Resp Panel by RT-PCR (Flu A&B, Covid) Nasopharyngeal Swab     Status: None   Collection Time: 06/15/21  5:16 PM   Specimen: Nasopharyngeal Swab; Nasopharyngeal(NP) swabs in vial transport medium  Result Value Ref Range  SARS Coronavirus 2 by RT PCR NEGATIVE NEGATIVE    Comment: (NOTE) SARS-CoV-2 target nucleic acids are NOT DETECTED.  The SARS-CoV-2 RNA is generally detectable in upper respiratory specimens during the acute phase of infection. The lowest concentration of SARS-CoV-2 viral copies this assay can detect is 138 copies/mL. A negative result does not preclude SARS-Cov-2 infection and should not be used as the sole basis for treatment or other patient management decisions. A negative result may occur with  improper specimen collection/handling, submission of specimen other than nasopharyngeal swab, presence of viral mutation(s) within the areas targeted by this assay, and inadequate number of viral copies(<138 copies/mL). A negative result must be combined with clinical observations, patient history, and epidemiological information. The expected result is Negative.  Fact Sheet for Patients:   EntrepreneurPulse.com.au  Fact Sheet for Healthcare Providers:  IncredibleEmployment.be  This test is no t yet approved or cleared by the [place] FDA and  has been authorized for detection and/or diagnosis of SARS-CoV-2 by FDA under an Emergency Use Authorization (EUA). This EUA will remain  in effect (meaning this test can be used) for the duration of the COVID-19 declaration under Section 564(b)(1) of the Act, 21 U.S.C.section 360bbb-3(b)(1), unless the authorization is terminated  or revoked sooner.       Influenza A by PCR NEGATIVE NEGATIVE   Influenza B by PCR NEGATIVE NEGATIVE    Comment: (NOTE) The Xpert Xpress SARS-CoV-2/FLU/RSV plus assay is intended as an aid in the diagnosis of influenza from Nasopharyngeal swab specimens and should not be used as a sole basis for treatment. Nasal washings and aspirates are unacceptable for Xpert Xpress SARS-CoV-2/FLU/RSV testing.  Fact Sheet for Patients: EntrepreneurPulse.com.au  Fact Sheet for Healthcare Providers: IncredibleEmployment.be  This test is not yet approved or cleared by the [place] FDA and has been authorized for detection and/or diagnosis of SARS-CoV-2 by FDA under an Emergency Use Authorization (EUA). This EUA will remain in effect (meaning this test can be used) for the duration of the COVID-19 declaration under Section 564(b)(1) of the Act, 21 U.S.C. section 360bbb-3(b)(1), unless the authorization is terminated or revoked.  Performed at [facility], [place] [address] [place], [place] [address]   Brain natriuretic peptide     Status: Abnormal   Collection Time: 06/15/21  5:18 PM  Result Value Ref Range   B Natriuretic Peptide 344.1 (H) 0.0 - 100.0 pg/mL    Comment: Performed at [facility], [place] [address] [place], [place] [address]  D-dimer, quantitative     Status: Abnormal    Collection Time: 06/15/21  5:45 PM  Result Value Ref Range   D-Dimer, Quant 0.64 (H) 0.00 - 0.50 ug/mL-FEU    Comment: (NOTE) At the manufacturer cut-off value of 0.5 g/mL FEU, this assay has a negative predictive value of 95-100%.This assay is intended for use in conjunction with a clinical pretest probability (PTP) assessment model to exclude pulmonary embolism (PE) and deep venous thrombosis (DVT) in outpatients suspected of PE or DVT. Results should be correlated with clinical presentation. Performed at [facility], [place] [address], [place], [place] [address]   POC occult blood, ED Provider will collect     Status: None   Collection Time: 06/15/21  7:32 PM  Result Value Ref Range   Fecal Occult Bld NEGATIVE NEGATIVE  Vitamin B12     Status: None   Collection Time: 06/15/21  8:22 PM  Result Value Ref Range   Vitamin B-12  300 180 - 914 pg/mL    Comment: (NOTE) This assay is not validated for testing neonatal or myeloproliferative syndrome specimens for Vitamin B12 levels. Performed at [facility], [place] [address] [place], [place] [address]   Folate     Status: None   Collection Time: 06/15/21  8:22 PM  Result Value Ref Range   Folate 10.6 >5.9 ng/mL    Comment: Performed at [facility], [place] [address] [place], [place] [address]  Iron and TIBC     Status: Abnormal   Collection Time: 06/15/21  8:22 PM  Result Value Ref Range   Iron 15 (L) 28 - 170 ug/dL   TIBC 536 (H) 250 - 450 ug/dL   Saturation Ratios 3 (L) 10.4 - 31.8 %   UIBC 521 ug/dL    Comment: Performed at [facility], [place] [address] [place], [place] [address]  Ferritin     Status: None   Collection Time: 06/15/21  8:22 PM  Result Value Ref Range   Ferritin 13 11 - 307 ng/mL    Comment: Performed at [facility], [place] [address] [place], [place] [address]  Reticulocytes     Status: Abnormal   Collection Time:  06/15/21  8:22 PM  Result Value Ref Range   Retic Ct Pct 2.2 0.4 - 3.1 %   RBC. 2.98 (L) 3.87 - 5.11 MIL/uL   Retic Count, Absolute 65.0 19.0 - 186.0 K/uL   Immature Retic Fract 27.7 (H) 2.3 - 15.9 %    Comment: Performed at [facility], [place] [address] [place], [place] [address]  Type and screen [place]     Status: None   Collection Time: 06/15/21 10:10 PM  Result Value Ref Range   ABO/RH(D) B POS    Antibody Screen NEG    Sample Expiration 06/18/2021,2359    Unit Number [id number]    Blood Component Type RED CELLS,LR    Unit division 00    Status of Unit ISSUED,FINAL    Transfusion Status OK TO TRANSFUSE    Crossmatch Result      Compatible Performed at [facility], [place] [address], [place], [place] [address]   Prepare RBC (crossmatch)     Status: None   Collection Time: 06/15/21 10:10 PM  Result Value Ref Range   Order Confirmation      ORDER PROCESSED BY BLOOD BANK Performed at [place] [address] [place], [place] [address]   Basic metabolic panel     Status: Abnormal   Collection Time: 06/16/21  6:44 AM  Result Value Ref Range   Sodium 136 135 - 145 mmol/L   Potassium 4.0 3.5 - 5.1 mmol/L   Chloride 95 (L) 98 - 111 mmol/L   CO2 32 22 - 32 mmol/L   Glucose, Bld 240 (H) 70 - 99 mg/dL    Comment: Glucose reference range applies only to samples taken after fasting for at least 8 hours.   BUN 50 (H) 8 - 23 mg/dL   Creatinine, Ser 1.59 (H) 0.44 - 1.00 mg/dL   Calcium 8.7 (L) 8.9 - 10.3 mg/dL   GFR, Estimated 31 (L) >60 mL/min    Comment: (NOTE) Calculated using the CKD-EPI Creatinine Equation (2021)    Anion gap 9 5 - 15    Comment: Performed at [facility], [place] [address], [place], [place] [address]  CBC  Status: Abnormal   Collection Time: 06/16/21  6:44 AM  Result Value Ref Range   WBC 5.2 4.0 - 10.5 K/uL   RBC 3.09 (L) 3.87 - 5.11 MIL/uL    Hemoglobin 8.3 (L) 12.0 - 15.0 g/dL   HCT 26.6 (L) 36.0 - 46.0 %   MCV 86.1 80.0 - 100.0 fL   MCH 26.9 26.0 - 34.0 pg   MCHC 31.2 30.0 - 36.0 g/dL   RDW 17.1 (H) 11.5 - 15.5 %   Platelets 403 (H) 150 - 400 K/uL   nRBC 0.0 0.0 - 0.2 %    Comment: Performed at [facility], [place] [address] [place], [place] [address]  Glucose, capillary     Status: Abnormal   Collection Time: 06/16/21  5:51 PM  Result Value Ref Range   Glucose-Capillary 127 (H) 70 - 99 mg/dL    Comment: Glucose reference range applies only to samples taken after fasting for at least 8 hours.  CBC     Status: Abnormal   Collection Time: 06/17/21  5:25 AM  Result Value Ref Range   WBC 8.5 4.0 - 10.5 K/uL   RBC 3.30 (L) 3.87 - 5.11 MIL/uL   Hemoglobin 8.8 (L) 12.0 - 15.0 g/dL   HCT 28.8 (L) 36.0 - 46.0 %   MCV 87.3 80.0 - 100.0 fL   MCH 26.7 26.0 - 34.0 pg   MCHC 30.6 30.0 - 36.0 g/dL   RDW 17.1 (H) 11.5 - 15.5 %   Platelets 400 150 - 400 K/uL   nRBC 0.0 0.0 - 0.2 %    Comment: Performed at [facility], [place] [address] [place], [place] [address]    Studies/Results: DG Chest 2 View  Result Date: 06/15/2021 CLINICAL DATA:  Shortness of breath. EXAM: CHEST - 2 VIEW COMPARISON:  December 20, 2019. FINDINGS: Stable cardiomegaly. Minimal bibasilar subsegmental atelectasis is noted. Bony thorax is unremarkable. IMPRESSION: Minimal bibasilar subsegmental atelectasis.  Mild cardiomegaly. Electronically Signed   By: Marijo Conception M.D.   On: 06/15/2021 17:20    Medications: I have reviewed the patient's current medications.  Assessment: Symptomatic anemia, severe iron deficiency, iron saturation 3%, elevated TIBC 536, ferritin 13 Hemoglobin 8.3 yesterday, 8.8 today FOBT negative Elevated BUN/creatinine ratio, 50/1.59 with a GFR of 31 Malnutrition, albumin 2.9, total protein normal at 7.1  Last dose of Eliquis yesterday morning-paroxysmal atrial fibrillation Heart failure Chronic  kidney disease Hypertension Hypothyroidism Abdominal aortic aneurysm, status post endovascular stent graft Obstructive sleep apnea intolerant of CPAP  Plan: Clear liquid diet today evening, n.p.o. postmidnight, continue pantoprazole 40 mg once a day. Plan diagnostic EGD in a.m. The risks and the benefits of the procedure were discussed with the patient in details. She understands and verbalizes consent.  Ronnette Juniper, MD 06/17/2021, 9:57 AM

## 2021-06-17 NOTE — Progress Notes (Signed)
Pt. Had multiple runs of PVC's , pt. Is not in distress, talking to her daughter on the phone.MD was notified of the PVC's will closely monitor pt. ?

## 2021-06-17 NOTE — TOC Initial Note (Addendum)
Transition of Care (TOC) - Initial/Assessment Note  ? ? ?Patient Details  ?Name: Lisa Mcclure ?MRN: [id number] ?Date of Birth: 1932-11-02 ? ?Transition of Care [facility]) CM/SW Contact:    ?Dessa Phi, RN ?Phone Number: ?06/17/2021, 12:57 PM ? ?Clinical Narrative:  d/c plan is home w/HHPT-await to discuss [place] w/Sandra dtr. Chronic 02.   ?-3:40p-spoke to dtr Sandra-patient only uses home 02 @ night. Nsg to check 02 sats.          ? ? ?Expected Discharge Plan: [place] ?Barriers to Discharge: Continued Medical Work up ? ? ?Patient Goals and CMS Choice ?Patient states their goals for this hospitalization and ongoing recovery are:: Home ?CMS Medicare.gov Compare Post Acute Care list provided to:: Patient Represenative (must comment) [name] dtr [phone]) ?Choice offered to / list presented to : Adult Children ? ?Expected Discharge Plan and Services ?Expected Discharge Plan: [place] ?  ?Discharge Planning Services: CM Consult ?Post Acute Care Choice: Home Health ?Living arrangements for the past 2 months: [place] ?                ?  ?  ?  ?  ?  ?  ?  ?  ?  ?  ? ?Prior Living Arrangements/Services ?Living arrangements for the past 2 months: [place] ?Lives with:: Self ?Patient language and need for interpreter reviewed:: Yes ?Do you feel safe going back to the place where you live?: Yes      ?Need for Family Participation in Patient Care: Yes (Comment) ?Care giver support system in place?: Yes (comment) ?Current home services: DME (chronic 02) ?Criminal Activity/Legal Involvement Pertinent to Current Situation/Hospitalization: No - Comment as needed ? ?Activities of Daily Living ?Home Assistive Devices/Equipment: Eyeglasses ?ADL Screening (condition at time of admission) ?Patient's cognitive ability adequate to safely complete daily activities?: Yes ?Is the patient deaf or have difficulty hearing?: No ?Does the patient have difficulty seeing,  even when wearing glasses/contacts?: Yes ?Does the patient have difficulty concentrating, remembering, or making decisions?: No ?Patient able to express need for assistance with ADLs?: Yes ?Does the patient have difficulty dressing or bathing?: No ?Independently performs ADLs?: No ?Communication: Independent ?Dressing (OT): Independent ?Grooming: Independent ?Feeding: Independent ?Bathing: Needs assistance ?Is this a change from baseline?: Pre-admission baseline ?Toileting: Needs assistance ?Is this a change from baseline?: Pre-admission baseline ?In/Out Bed: Needs assistance ?Is this a change from baseline?: Pre-admission baseline ?Walks in Home: Needs assistance ?Is this a change from baseline?: Pre-admission baseline ?Does the patient have difficulty walking or climbing stairs?: Yes ?Weakness of Legs: Both ?Weakness of Arms/Hands: Both ? ?Permission Sought/Granted ?Permission sought to share information with : Case Manager ?Permission granted to share information with : Yes, Verbal Permission Granted ? Share Information with NAME: Case manager ?   ?   ?   ? ?Emotional Assessment ?Appearance:: Appears stated age ?Attitude/Demeanor/Rapport: Gracious ?Affect (typically observed): Accepting ?Orientation: : Oriented to Self, Oriented to Place, Oriented to  Time, Oriented to Situation ?Alcohol / Substance Use: Not Applicable ?Psych Involvement: No (comment) ? ?Admission diagnosis:  Shortness of breath [R06.02] ?Acute respiratory failure with hypoxia ([place]) [J96.01] ?Patient Active Problem List  ? Diagnosis Date Noted  ? Acute respiratory failure with hypoxia ([place]) 06/15/2021  ? Paroxysmal atrial fibrillation ([place]) 06/15/2021  ? Acute renal failure superimposed on stage 3b chronic kidney disease ([place]) 06/15/2021  ? Generalized weakness 06/15/2021  ? Atrial fibrillation with RVR ([place])   ?  Long term (current) use of anticoagulants   ? Long term current use of antiarrhythmic drug   ? AAA (abdominal aortic aneurysm) without  rupture 12/20/2019  ? AAA (abdominal aortic aneurysm) 12/20/2019  ? Intolerance of continuous positive airway pressure (CPAP) ventilation 11/26/2019  ? Severe hypoxemia 08/21/2019  ? Severe obstructive sleep apnea 07/29/2019  ? Chronic obstructive pulmonary disease ([place]) 07/29/2019  ? RLS (restless legs syndrome) 07/29/2019  ? Dehydration with hyponatremia 09/13/2018  ? Bradycardia 09/13/2018  ? Glaucoma 09/13/2018  ? OA (osteoarthritis) of shoulder 05/16/2018  ? Incomplete emptying of bladder 05/17/2017  ? Labial adhesions 05/17/2017  ? Mixed stress and urge urinary incontinence 05/17/2017  ? Urethral sphincter deficiency, intrinsic (ISD) 05/17/2017  ? Aortic stenosis, mild 01/15/2016  ? Bilateral carotid bruits 05/11/2015  ? Hyponatremia 04/19/2015  ? Symptomatic anemia 04/19/2015  ? Panic attack 04/19/2015  ? Constipation 04/19/2015  ? Hyperkalemia 04/17/2015  ? Influenza A 04/16/2015  ? Proximal humerus fracture   ? Left patella fracture   ? Benign essential HTN   ? Esophageal reflux   ? Chronic heart failure with preserved ejection fraction (HFpEF) ([place])   ? Hypothyroidism   ? Lower GI bleed 04/08/2015  ? Hematochezia   ? Rectal bleeding 04/03/2015  ? Patellar fracture 03/06/2015  ? Left humeral fracture 11/04/2013  ? Rib fracture 11/04/2013  ? Hypothyroid 11/04/2013  ? Hypertension 11/04/2013  ? Right shoulder pain 11/04/2013  ? OSA (obstructive sleep apnea) 11/13/2008  ? BOOP (bronchiolitis obliterans with organizing pneumonia) ([place]) 08/25/2008  ? ?PCP:  [name], MD ?Pharmacy:   ?[facility] [place] [address] ?[place] [address] ?Phone: [phone] Fax: [phone] ? ?[facility] - [place], [place] - [address] ?[address] ?[place] [address] ?Phone: [phone] Fax: [phone] ? ? ? ? ?Social Determinants of Health (SDOH) Interventions ?  ? ?Readmission Risk  Interventions ?Readmission Risk Prevention Plan 12/25/2019  ?Transportation Screening Complete  ?PCP or Specialist Appt within 3-5 Days Complete  ?[place] or Home Care Consult Complete  ?Social Work Consult for [place] Planning/Counseling Complete  ?Palliative Care Screening Not Applicable  ?Medication Review Press photographer) Complete  ?Some recent data might be hidden  ? ? ? ?

## 2021-06-17 NOTE — Progress Notes (Signed)
Subjective: Patient states her last bowel movement was yesterday, denies noticing blood in stool or black stools. Denies abdominal pain, nausea or vomiting. Is not on oxygen anymore. However continues to feel weak and complains of back pain(she was scheduled for an epidural injection which had to be canceled as patient got admitted)  Objective: Vital signs in last 24 hours: Temp:  [97.5 F (36.4 C)-98 F (36.7 C)] 97.5 F (36.4 C) (03/09 0457) Pulse Rate:  [59-67] 59 (03/09 0457) Resp:  [15-20] 20 (03/09 0900) BP: (101-153)/(48-69) 153/69 (03/09 0457) SpO2:  [95 %-99 %] 99 % (03/09 0900) FiO2 (%):  [28 %] 28 % (03/09 0900) Weight:  [98.4 kg] 98.4 kg (03/09 0600) Weight change: 3.145 kg Last BM Date : 06/15/21  PE: Elderly, overweight, pale GENERAL: Not in distress, able to speak in full sentences ABDOMEN: Nondistended, nontender, normoactive bowel sounds EXTREMITIES: Mild bilateral erythema noted in lower legs, mild pitting pedal edema  Lab Results: Results for orders placed or performed during the hospital encounter of 06/15/21 (from the past 48 hour(s))  CBC with Differential     Status: Abnormal   Collection Time: 06/15/21  5:12 PM  Result Value Ref Range   WBC 7.3 4.0 - 10.5 K/uL   RBC 2.80 (L) 3.87 - 5.11 MIL/uL   Hemoglobin 7.4 (L) 12.0 - 15.0 g/dL   HCT 24.8 (L) 36.0 - 46.0 %   MCV 88.6 80.0 - 100.0 fL   MCH 26.4 26.0 - 34.0 pg   MCHC 29.8 (L) 30.0 - 36.0 g/dL   RDW 16.7 (H) 11.5 - 15.5 %   Platelets 392 150 - 400 K/uL   nRBC 0.0 0.0 - 0.2 %   Neutrophils Relative % 72 %   Neutro Abs 5.3 1.7 - 7.7 K/uL   Lymphocytes Relative 11 %   Lymphs Abs 0.8 0.7 - 4.0 K/uL   Monocytes Relative 12 %   Monocytes Absolute 0.9 0.1 - 1.0 K/uL   Eosinophils Relative 3 %   Eosinophils Absolute 0.2 0.0 - 0.5 K/uL   Basophils Relative 1 %   Basophils Absolute 0.1 0.0 - 0.1 K/uL   Immature Granulocytes 1 %   Abs Immature Granulocytes 0.04 0.00 - 0.07 K/uL    Comment: Performed  at [facility], [place] [address], [place], [place] [address]  Basic metabolic panel     Status: Abnormal   Collection Time: 06/15/21  5:12 PM  Result Value Ref Range   Sodium 137 135 - 145 mmol/L   Potassium 4.2 3.5 - 5.1 mmol/L   Chloride 93 (L) 98 - 111 mmol/L   CO2 33 (H) 22 - 32 mmol/L   Glucose, Bld 98 70 - 99 mg/dL    Comment: Glucose reference range applies only to samples taken after fasting for at least 8 hours.   BUN 52 (H) 8 - 23 mg/dL   Creatinine, Ser 2.00 (H) 0.44 - 1.00 mg/dL   Calcium 8.7 (L) 8.9 - 10.3 mg/dL   GFR, Estimated 24 (L) >60 mL/min    Comment: (NOTE) Calculated using the CKD-EPI Creatinine Equation (2021)    Anion gap 11 5 - 15    Comment: Performed at [facility], [place] [address], [place], [place] [address]  Resp Panel by RT-PCR (Flu A&B, Covid) Nasopharyngeal Swab     Status: None   Collection Time: 06/15/21  5:16 PM   Specimen: Nasopharyngeal Swab; Nasopharyngeal(NP) swabs in vial transport medium  Result Value Ref Range  SARS Coronavirus 2 by RT PCR NEGATIVE NEGATIVE    Comment: (NOTE) SARS-CoV-2 target nucleic acids are NOT DETECTED.  The SARS-CoV-2 RNA is generally detectable in upper respiratory specimens during the acute phase of infection. The lowest concentration of SARS-CoV-2 viral copies this assay can detect is 138 copies/mL. A negative result does not preclude SARS-Cov-2 infection and should not be used as the sole basis for treatment or other patient management decisions. A negative result may occur with  improper specimen collection/handling, submission of specimen other than nasopharyngeal swab, presence of viral mutation(s) within the areas targeted by this assay, and inadequate number of viral copies(<138 copies/mL). A negative result must be combined with clinical observations, patient history, and epidemiological information. The expected result is Negative.  Fact Sheet for Patients:   EntrepreneurPulse.com.au  Fact Sheet for Healthcare Providers:  IncredibleEmployment.be  This test is no t yet approved or cleared by the [place] FDA and  has been authorized for detection and/or diagnosis of SARS-CoV-2 by FDA under an Emergency Use Authorization (EUA). This EUA will remain  in effect (meaning this test can be used) for the duration of the COVID-19 declaration under Section 564(b)(1) of the Act, 21 U.S.C.section 360bbb-3(b)(1), unless the authorization is terminated  or revoked sooner.       Influenza A by PCR NEGATIVE NEGATIVE   Influenza B by PCR NEGATIVE NEGATIVE    Comment: (NOTE) The Xpert Xpress SARS-CoV-2/FLU/RSV plus assay is intended as an aid in the diagnosis of influenza from Nasopharyngeal swab specimens and should not be used as a sole basis for treatment. Nasal washings and aspirates are unacceptable for Xpert Xpress SARS-CoV-2/FLU/RSV testing.  Fact Sheet for Patients: EntrepreneurPulse.com.au  Fact Sheet for Healthcare Providers: IncredibleEmployment.be  This test is not yet approved or cleared by the [place] FDA and has been authorized for detection and/or diagnosis of SARS-CoV-2 by FDA under an Emergency Use Authorization (EUA). This EUA will remain in effect (meaning this test can be used) for the duration of the COVID-19 declaration under Section 564(b)(1) of the Act, 21 U.S.C. section 360bbb-3(b)(1), unless the authorization is terminated or revoked.  Performed at [facility], [place] [address] [place], [place] [address]   Brain natriuretic peptide     Status: Abnormal   Collection Time: 06/15/21  5:18 PM  Result Value Ref Range   B Natriuretic Peptide 344.1 (H) 0.0 - 100.0 pg/mL    Comment: Performed at [facility], [place] [address] [place], [place] [address]  D-dimer, quantitative     Status: Abnormal    Collection Time: 06/15/21  5:45 PM  Result Value Ref Range   D-Dimer, Quant 0.64 (H) 0.00 - 0.50 ug/mL-FEU    Comment: (NOTE) At the manufacturer cut-off value of 0.5 g/mL FEU, this assay has a negative predictive value of 95-100%.This assay is intended for use in conjunction with a clinical pretest probability (PTP) assessment model to exclude pulmonary embolism (PE) and deep venous thrombosis (DVT) in outpatients suspected of PE or DVT. Results should be correlated with clinical presentation. Performed at [facility], [place] [address], [place], [place] [address]   POC occult blood, ED Provider will collect     Status: None   Collection Time: 06/15/21  7:32 PM  Result Value Ref Range   Fecal Occult Bld NEGATIVE NEGATIVE  Vitamin B12     Status: None   Collection Time: 06/15/21  8:22 PM  Result Value Ref Range   Vitamin B-12  300 180 - 914 pg/mL    Comment: (NOTE) This assay is not validated for testing neonatal or myeloproliferative syndrome specimens for Vitamin B12 levels. Performed at [facility], [place] [address] [place], [place] [address]   Folate     Status: None   Collection Time: 06/15/21  8:22 PM  Result Value Ref Range   Folate 10.6 >5.9 ng/mL    Comment: Performed at [facility], [place] [address], [place], [place] [address]  Iron and TIBC     Status: Abnormal   Collection Time: 06/15/21  8:22 PM  Result Value Ref Range   Iron 15 (L) 28 - 170 ug/dL   TIBC 536 (H) 250 - 450 ug/dL   Saturation Ratios 3 (L) 10.4 - 31.8 %   UIBC 521 ug/dL    Comment: Performed at [facility], [place] [address], [place], [place] [address]  Ferritin     Status: None   Collection Time: 06/15/21  8:22 PM  Result Value Ref Range   Ferritin 13 11 - 307 ng/mL    Comment: Performed at [facility], [place] [address] [place], [place] [address]  Reticulocytes     Status: Abnormal   Collection Time:  06/15/21  8:22 PM  Result Value Ref Range   Retic Ct Pct 2.2 0.4 - 3.1 %   RBC. 2.98 (L) 3.87 - 5.11 MIL/uL   Retic Count, Absolute 65.0 19.0 - 186.0 K/uL   Immature Retic Fract 27.7 (H) 2.3 - 15.9 %    Comment: Performed at [facility], [place] [address] [place], [place] [address]  Type and screen [place]     Status: None   Collection Time: 06/15/21 10:10 PM  Result Value Ref Range   ABO/RH(D) B POS    Antibody Screen NEG    Sample Expiration 06/18/2021,2359    Unit Number [id number]    Blood Component Type RED CELLS,LR    Unit division 00    Status of Unit ISSUED,FINAL    Transfusion Status OK TO TRANSFUSE    Crossmatch Result      Compatible Performed at [facility], [place] [address], [place], [place] [address]   Prepare RBC (crossmatch)     Status: None   Collection Time: 06/15/21 10:10 PM  Result Value Ref Range   Order Confirmation      ORDER PROCESSED BY BLOOD BANK Performed at [place] [address] [place], [place] [address]   Basic metabolic panel     Status: Abnormal   Collection Time: 06/16/21  6:44 AM  Result Value Ref Range   Sodium 136 135 - 145 mmol/L   Potassium 4.0 3.5 - 5.1 mmol/L   Chloride 95 (L) 98 - 111 mmol/L   CO2 32 22 - 32 mmol/L   Glucose, Bld 240 (H) 70 - 99 mg/dL    Comment: Glucose reference range applies only to samples taken after fasting for at least 8 hours.   BUN 50 (H) 8 - 23 mg/dL   Creatinine, Ser 1.59 (H) 0.44 - 1.00 mg/dL   Calcium 8.7 (L) 8.9 - 10.3 mg/dL   GFR, Estimated 31 (L) >60 mL/min    Comment: (NOTE) Calculated using the CKD-EPI Creatinine Equation (2021)    Anion gap 9 5 - 15    Comment: Performed at [facility], [place] [address] [place], [place] [address]  CBC  Status: Abnormal   Collection Time: 06/16/21  6:44 AM  Result Value Ref Range   WBC 5.2 4.0 - 10.5 K/uL   RBC 3.09 (L) 3.87 - 5.11 MIL/uL    Hemoglobin 8.3 (L) 12.0 - 15.0 g/dL   HCT 26.6 (L) 36.0 - 46.0 %   MCV 86.1 80.0 - 100.0 fL   MCH 26.9 26.0 - 34.0 pg   MCHC 31.2 30.0 - 36.0 g/dL   RDW 17.1 (H) 11.5 - 15.5 %   Platelets 403 (H) 150 - 400 K/uL   nRBC 0.0 0.0 - 0.2 %    Comment: Performed at [facility], [place] [address], [place], [place] [address]  Glucose, capillary     Status: Abnormal   Collection Time: 06/16/21  5:51 PM  Result Value Ref Range   Glucose-Capillary 127 (H) 70 - 99 mg/dL    Comment: Glucose reference range applies only to samples taken after fasting for at least 8 hours.  CBC     Status: Abnormal   Collection Time: 06/17/21  5:25 AM  Result Value Ref Range   WBC 8.5 4.0 - 10.5 K/uL   RBC 3.30 (L) 3.87 - 5.11 MIL/uL   Hemoglobin 8.8 (L) 12.0 - 15.0 g/dL   HCT 28.8 (L) 36.0 - 46.0 %   MCV 87.3 80.0 - 100.0 fL   MCH 26.7 26.0 - 34.0 pg   MCHC 30.6 30.0 - 36.0 g/dL   RDW 17.1 (H) 11.5 - 15.5 %   Platelets 400 150 - 400 K/uL   nRBC 0.0 0.0 - 0.2 %    Comment: Performed at [facility], [place] [address], [place], [place] [address]    Studies/Results: DG Chest 2 View  Result Date: 06/15/2021 CLINICAL DATA:  Shortness of breath. EXAM: CHEST - 2 VIEW COMPARISON:  December 20, 2019. FINDINGS: Stable cardiomegaly. Minimal bibasilar subsegmental atelectasis is noted. Bony thorax is unremarkable. IMPRESSION: Minimal bibasilar subsegmental atelectasis.  Mild cardiomegaly. Electronically Signed   By: Marijo Conception M.D.   On: 06/15/2021 17:20    Medications: I have reviewed the patient's current medications.  Assessment: Symptomatic anemia, severe iron deficiency, iron saturation 3%, elevated TIBC 536, ferritin 13 Hemoglobin 8.3 yesterday, 8.8 today FOBT negative Elevated BUN/creatinine ratio, 50/1.59 with a GFR of 31 Malnutrition, albumin 2.9, total protein normal at 7.1  Last dose of Eliquis yesterday morning-paroxysmal atrial fibrillation Heart failure Chronic  kidney disease Hypertension Hypothyroidism Abdominal aortic aneurysm, status post endovascular stent graft Obstructive sleep apnea intolerant of CPAP  Plan: Clear liquid diet today evening, n.p.o. postmidnight, continue pantoprazole 40 mg once a day. Plan diagnostic EGD in a.m. The risks and the benefits of the procedure were discussed with the patient in details. She understands and verbalizes consent.  Ronnette Juniper, MD 06/17/2021, 9:57 AM

## 2021-06-17 NOTE — Progress Notes (Signed)
?   06/17/21 1334  ?Vitals  ?Temp (!) 97.4 ?F (36.3 ?C)  ?Temp Source Oral  ?BP (!) 97/45  ?MAP (mmHg) (!) 59  ?BP Location Right Wrist  ?BP Method Automatic  ?Patient Position (if appropriate) Lying  ?Pulse Rate (!) 56  ?Pulse Rate Source Monitor  ?Resp 18  ?MEWS COLOR  ?MEWS Score Color Green  ?Oxygen Therapy  ?SpO2 93 %  ?O2 Device Room Air  ?MEWS Score  ?MEWS Temp 0  ?MEWS Systolic 1  ?MEWS Pulse 0  ?MEWS RR 0  ?MEWS LOC 0  ?MEWS Score 1  ? ?Assessed pt. Denies dizziness, chest pain and SOB. BP rechecked 101/54.. no bleeding. MD notified. ?

## 2021-06-17 NOTE — Progress Notes (Signed)
Mobility Specialist - Cancellation Note ? ? ? 06/17/21 1311  ?Mobility  ?Activity Refused mobility  ? ?Pt refused mobility this afternoon. Prefers to finish lunch and then ambulate with family. Will check back as schedule permits.  ? ?Montserrath Madding S. ?Mobility Specialist ?Acute Rehabilitation Services ?Phone: [phone] ?06/17/21, 1:12 PM ? ? ?

## 2021-06-18 ENCOUNTER — Inpatient Hospital Stay (HOSPITAL_COMMUNITY): Payer: Medicare Other

## 2021-06-18 ENCOUNTER — Inpatient Hospital Stay (HOSPITAL_COMMUNITY): Payer: Medicare Other | Admitting: Anesthesiology

## 2021-06-18 ENCOUNTER — Encounter (HOSPITAL_COMMUNITY): Payer: Self-pay | Admitting: Internal Medicine

## 2021-06-18 ENCOUNTER — Encounter (HOSPITAL_COMMUNITY)
Admission: EM | Disposition: A | Discharge status: Home Health | Payer: Self-pay | Source: Ambulatory Visit | Attending: Internal Medicine

## 2021-06-18 DIAGNOSIS — K3189 Other diseases of stomach and duodenum: Secondary | ICD-10-CM

## 2021-06-18 DIAGNOSIS — I509 Heart failure, unspecified: Secondary | ICD-10-CM

## 2021-06-18 DIAGNOSIS — J9601 Acute respiratory failure with hypoxia: Secondary | ICD-10-CM | POA: Diagnosis not present

## 2021-06-18 DIAGNOSIS — K259 Gastric ulcer, unspecified as acute or chronic, without hemorrhage or perforation: Secondary | ICD-10-CM

## 2021-06-18 DIAGNOSIS — I130 Hypertensive heart and chronic kidney disease with heart failure and stage 1 through stage 4 chronic kidney disease, or unspecified chronic kidney disease: Secondary | ICD-10-CM

## 2021-06-18 HISTORY — PX: ESOPHAGOGASTRODUODENOSCOPY (EGD) WITH PROPOFOL: SHX5813

## 2021-06-18 HISTORY — PX: BIOPSY: SHX5522

## 2021-06-18 LAB — CBC
HCT: 28 % — ABNORMAL LOW (ref 36.0–46.0)
Hemoglobin: 8.5 g/dL — ABNORMAL LOW (ref 12.0–15.0)
MCH: 27.2 pg (ref 26.0–34.0)
MCHC: 30.4 g/dL (ref 30.0–36.0)
MCV: 89.5 fL (ref 80.0–100.0)
Platelets: 367 10*3/uL (ref 150–400)
RBC: 3.13 MIL/uL — ABNORMAL LOW (ref 3.87–5.11)
RDW: 17.3 % — ABNORMAL HIGH (ref 11.5–15.5)
WBC: 8.7 10*3/uL (ref 4.0–10.5)
nRBC: 0 % (ref 0.0–0.2)

## 2021-06-18 LAB — PROCALCITONIN: Procalcitonin: <0.1 ng/mL

## 2021-06-18 LAB — APTT: aPTT: 74 seconds — ABNORMAL HIGH (ref 24–36)

## 2021-06-18 LAB — HEPARIN LEVEL (UNFRACTIONATED): Heparin Unfractionated: 0.61 IU/mL (ref 0.30–0.70)

## 2021-06-18 SURGERY — ESOPHAGOGASTRODUODENOSCOPY (EGD) WITH PROPOFOL
Anesthesia: Monitor Anesthesia Care

## 2021-06-18 MED ORDER — SODIUM CHLORIDE 0.9 % IV SOLN
3.0000 g | Freq: Two times a day (BID) | INTRAVENOUS | Status: DC
  Administered 2021-06-18 – 2021-06-20 (×4): 3 g via INTRAVENOUS
  Filled 2021-06-18 (×5): qty 8

## 2021-06-18 MED ORDER — HEPARIN (PORCINE) 25000 UT/250ML-% IV SOLN
1050.0000 [IU]/h | INTRAVENOUS | Status: DC
  Administered 2021-06-18: 1050 [IU]/h via INTRAVENOUS
  Filled 2021-06-18 (×2): qty 250

## 2021-06-18 MED ORDER — PROPOFOL 500 MG/50ML IV EMUL
INTRAVENOUS | Status: AC
  Filled 2021-06-18: qty 150

## 2021-06-18 MED ORDER — DICLOFENAC SODIUM 1 % TD GEL: 2.2500 g | Freq: Three times a day (TID) | TRANSDERMAL | Status: DC | PRN

## 2021-06-18 MED ORDER — IPRATROPIUM-ALBUTEROL 0.5-2.5 (3) MG/3ML IN SOLN
3.0000 mL | Freq: Once | RESPIRATORY_TRACT | Status: AC
  Administered 2021-06-18: 3 mL via RESPIRATORY_TRACT

## 2021-06-18 MED ORDER — PROPOFOL 500 MG/50ML IV EMUL
INTRAVENOUS | Status: AC
Start: 2021-06-18 — End: ?
  Filled 2021-06-18: qty 50

## 2021-06-18 MED ORDER — FUROSEMIDE 20 MG PO TABS
20.0000 mg | ORAL_TABLET | Freq: Every day | ORAL | Status: DC
  Administered 2021-06-18 – 2021-06-19 (×2): 20 mg via ORAL
  Filled 2021-06-18 (×2): qty 1

## 2021-06-18 MED ORDER — GUAIFENESIN ER 600 MG PO TB12
1200.0000 mg | ORAL_TABLET | Freq: Two times a day (BID) | ORAL | Status: DC
  Administered 2021-06-18 – 2021-06-21 (×7): 1200 mg via ORAL
  Filled 2021-06-18 (×7): qty 2

## 2021-06-18 MED ORDER — PANTOPRAZOLE SODIUM 40 MG PO TBEC
40.0000 mg | DELAYED_RELEASE_TABLET | Freq: Two times a day (BID) | ORAL | Status: DC
Start: 2021-06-18 — End: 2021-06-21
  Administered 2021-06-18 – 2021-06-21 (×6): 40 mg via ORAL
  Filled 2021-06-18 (×6): qty 1

## 2021-06-18 MED ORDER — CARVEDILOL 6.25 MG PO TABS
6.2500 mg | ORAL_TABLET | Freq: Two times a day (BID) | ORAL | Status: DC
  Administered 2021-06-18 – 2021-06-21 (×6): 6.25 mg via ORAL
  Filled 2021-06-18 (×6): qty 1

## 2021-06-18 MED ORDER — LOSARTAN POTASSIUM 50 MG PO TABS
100.0000 mg | ORAL_TABLET | Freq: Every day | ORAL | Status: DC
  Administered 2021-06-18 – 2021-06-21 (×4): 100 mg via ORAL
  Filled 2021-06-18 (×5): qty 2

## 2021-06-18 MED ORDER — ALBUTEROL SULFATE (2.5 MG/3ML) 0.083% IN NEBU: 2.5000 mg | INHALATION_SOLUTION | Freq: Four times a day (QID) | RESPIRATORY_TRACT | Status: DC | PRN

## 2021-06-18 MED ORDER — LACTATED RINGERS IV SOLN: INTRAVENOUS | Status: DC | PRN

## 2021-06-18 MED ORDER — FERROUS SULFATE 325 (65 FE) MG PO TABS
325.0000 mg | ORAL_TABLET | Freq: Every day | ORAL | Status: DC
  Filled 2021-06-18 (×2): qty 1

## 2021-06-18 MED ORDER — PROPOFOL 500 MG/50ML IV EMUL
INTRAVENOUS | Status: DC | PRN
Start: 2021-06-18 — End: 2021-06-18
  Administered 2021-06-18: 10 mg via INTRAVENOUS
  Administered 2021-06-18: 50 mg via INTRAVENOUS

## 2021-06-18 MED ORDER — GUAIFENESIN-DM 100-10 MG/5ML PO SYRP
5.0000 mL | ORAL_SOLUTION | ORAL | Status: DC | PRN
  Administered 2021-06-18 – 2021-06-19 (×2): 5 mL via ORAL
  Filled 2021-06-18 (×2): qty 10

## 2021-06-18 MED ORDER — IPRATROPIUM-ALBUTEROL 0.5-2.5 (3) MG/3ML IN SOLN
RESPIRATORY_TRACT | Status: AC
  Filled 2021-06-18: qty 3

## 2021-06-18 MED ORDER — DICLOFENAC SODIUM 1 % EX GEL: 2.0000 g | Freq: Three times a day (TID) | CUTANEOUS | Status: DC | PRN

## 2021-06-18 MED ORDER — AMLODIPINE BESYLATE 10 MG PO TABS
10.0000 mg | ORAL_TABLET | Freq: Every day | ORAL | Status: DC
  Administered 2021-06-18: 10 mg via ORAL
  Filled 2021-06-18: qty 1

## 2021-06-18 MED ORDER — FERROUS SULFATE 325 (65 FE) MG PO TABS
325.0000 mg | ORAL_TABLET | Freq: Every day | ORAL | Status: DC
Start: 2021-06-18 — End: 2021-06-21
  Administered 2021-06-18 – 2021-06-21 (×4): 325 mg via ORAL
  Filled 2021-06-18 (×3): qty 1

## 2021-06-18 MED ORDER — HYDROXYZINE HCL 25 MG PO TABS
25.0000 mg | ORAL_TABLET | Freq: Once | ORAL | Status: AC
  Administered 2021-06-18: 25 mg via ORAL
  Filled 2021-06-18: qty 1

## 2021-06-18 SURGICAL SUPPLY — 15 items

## 2021-06-18 NOTE — Progress Notes (Signed)
ANTICOAGULATION CONSULT NOTE  ? ?Pharmacy Consult for heparin ?Indication: atrial fibrillation ? ?Allergies  ?Allergen Reactions  ? Statins Other (See Comments)  ?  Muscle aches and INTERNAL BLEEDING  ? Atorvastatin   ?  Other reaction(s): Myalgias  ? Gabapentin Other (See Comments)  ?  "Made me loopy"  ? Methocarbamol Other (See Comments)  ?  "Made me loopy"  ? ? ?Patient Measurements: ?Height: '5\' 3"'$  (160 cm) ?Weight: 100.8 kg (222 lb 3.6 oz) ?IBW/kg (Calculated) : 52.4 ?Heparin Dosing Weight: 74.6 kg ? ?Vital Signs: ?Temp: 98.2 ?F (36.8 ?C) (03/10 2005) ?Temp Source: Oral (03/10 2005) ?BP: 110/56 (03/10 2005) ?Pulse Rate: 57 (03/10 2005) ? ?Labs: ?Recent Labs  ?  06/16/21 ?0644 06/17/21 ?1093 06/17/21 ?1249 06/18/21 ?0438 06/18/21 ?2023  ?HGB 8.3* 8.8*  --  8.5*  --   ?HCT 26.6* 28.8*  --  28.0*  --   ?PLT 403* 400  --  367  --   ?APTT  --   --  32  --  74*  ?HEPARINUNFRC  --   --  0.67  --  0.61  ?CREATININE 1.59*  --   --   --   --   ? ? ? ?Estimated Creatinine Clearance: 28.8 mL/min (A) (by C-G formula based on SCr of 1.59 mg/dL (H)). ? ?Assessment: ?88 yo w/ symptomatic anemia.  Pt on apixaban 2.5 mg po BID PTA for PAF. Last dose 3/8 0022 am. ?Pharmacy consulted for heparin bridge while apixaban on hold.  ?1 U PRBC given 3/8. Hg up to 8.8. SCr 1.59 ?06/18/2021 ?S/p EGD this morning w/ findings of 1 non-bleeding gastric ulcer, biopsies taken ?Heparin off last PM at 2330 ?Per GI> OK to resume heparin 4 hrs s/p EGD and resume apixaban 3/11 ?Hg stable at 8.5, PLT WNL ?Baseline heparin level on 3/9: heparin level 0.67 & aPTT 32 sec ? ?Goal of Therapy:  ?Heparin level 0.3-0.7 units/ml ?aPTT 66-102 seconds ?Monitor platelets by anticoagulation protocol: Yes ?  ?Plan:  ?-aPTT 74, therapeutic on heparin 1050 units/hr ?-While heparin level therapeutic, does not appear to be correlating with aPTT just yet. Will monitor using aPTTs until apixaban effect wears off and better correlation ?-Daily CBC, aPTT, heparin  level ?-F/u plans to resume apixaban 3/11 per GI recs ? ?Tawnya Crook, PharmD, BCPS ?Clinical Pharmacist ?06/18/2021 8:52 PM ? ? ? ?

## 2021-06-18 NOTE — Consult Note (Signed)
NAME:  Lisa Mcclure, MRN:  [id number], DOB:  March 21, 1933, LOS: 2 ADMISSION DATE:  06/15/2021 CONSULTATION DATE:  06/18/2021 REFERRING MD:  [place] Crane - [facility] CHIEF COMPLAINT:  Congestion, chronic cough, dyspnea  History of Present Illness:  88 year old woman who presented to [facility] ED with worsening dyspnea. PMHx significant for HTN, HFpEF (Echo 1/10 with EF 55-60%), PAF (on Eliquis), chronic anemia, OSA (does not tolerate CPAP, wears 2LNC), mild restrictive lung disease (based on PFTs 2017) with question of small airway disease, GERD, diverticulosis, CKD stage IIIb, stress incontinence, former smoker.  Patient is seen up to chair at bedside.  Reports that she has had a persistent cough/upper airway congestion for approximately 6 months; despite interventions this has not resolved.  She had a similar chronic cough years ago for which she underwent extensive pulmonary work-up with Dr. Halford Chessman (2012)/Dr. Vaughan Browner (2017).  This eventually resolved after several interventions, but she feels antibiotics were the most significant of these interventions.  Currently, she reports persistent productive cough, she is unable to get sputum up describing it as "Elmer's glue".  Reports that she has attempted to use an albuterol inhaler at home but struggles with this due to fine motor coordination.  It sounds as if she was provided with nebulizer vials but does not have a nebulizer at home.  She does utilize 2L [place] nightly for sleep apnea, as she is unable to tolerate her CPAP.  More recently she has required 2L [place] for activities requiring exertion such as making the bed, common ADLs. Denies fever/chills, recent sick contacts, CP, n/v/abdominal pain, dizziness or lightheadedness. Endorses SOB, DOE, LE edema (no worse than baseline).  PCCM consulted for assistance with management.  Pertinent Medical History:   Past Medical History:  Diagnosis Date   Anemia    h/o hemorrhoidal bleeding and blood transfusion   Cardiomegaly     Chronic diastolic CHF (congestive heart failure) (HCC)    Depression    Diverticulosis    DOE (dyspnea on exertion)    Esophageal reflux    GERD (gastroesophageal reflux disease)    Hypothyroidism    Insomnia    LBP (low back pain)    OAB (overactive bladder)    Obesity    OSA (obstructive sleep apnea)    Osteoarthritis    Paroxysmal atrial fibrillation (HCC)    Rheumatoid arthritis(714.0)    Shoulder pain, bilateral    Unspecified essential hypertension    Venous insufficiency    Significant Hospital Events: Including procedures, antibiotic start and stop dates in addition to other pertinent events   3.7 - Admitted for SOB, anemia 3/10 - PCCM consulted for chronic cough/chest congestion  Interim History / Subjective:  As above  Objective:  Blood pressure (!) 137/55, pulse (!) 58, temperature 98.2 F (36.8 C), temperature source Oral, resp. rate 12, height '5\' 3"'$  (1.6 m), weight 100.8 kg, SpO2 95 %.    FiO2 (%):  [28 %] 28 %   Intake/Output Summary (Last 24 hours) at 06/18/2021 1147 Last data filed at 06/18/2021 0900 Gross per 24 hour  Intake 1210.44 ml  Output 1000 ml  Net 210.44 ml   Filed Weights   06/17/21 0600 06/18/21 0500 06/18/21 0714  Weight: 98.4 kg 100.8 kg 100.8 kg   Physical Examination: General: Chronically ill-appearing elderly woman in NAD. Pleasant and conversant, sitting up in chair at bedside. HEENT: [place]/AT, anicteric sclera, PERRL, moist mucous membranes. Neuro: Awake, oriented x 4. Responds to verbal stimuli. Following commands consistently. Moves all 4  extremities spontaneously. CV: RRR, no m/g/r. PULM: Breathing even and unlabored on 2LNC. Lung fields with faint expiratory wheeze, fair air movement, diminished at bilateral bases R > L. GI: Soft, nontender, nondistended. Normoactive bowel sounds. Extremities: Bilateral symmetric 1+ pitting LE edema noted. Skin: Warm/dry, no rashes.  Resolved Hospital Problem List:    Assessment & Plan:    Ms. Binford is seen in consultation at the request of Dr. [place] Crane [facility]) for further evaluation and management of chronic cough/congestion.   88 year old woman who presented to [facility] ED with worsening dyspnea (noticed over the last 6 months). HFpEF (Echo 1/10 with EF 55-60%), PAF (on Eliquis), chronic anemia, OSA (does not tolerate CPAP, wears 2LNC), mild restrictive lung disease (based on PFTs 2017) with question of small airway disease, GERD, diverticulosis, CKD stage IIIb, stress incontinence, former smoker.   She has had nonspecific chronic cough and congestion for several months and per patient this feels reminiscent of her symptoms in 2012 and 2017 during which she underwent more extensive pulmonary workup. Likely no acute process occurring at this time. CT Chest is pending.  Chronic cough OSA Mild restrictive lung disease vs. small airway disease - F/u Noncontrast CT Chest - Continue supplemental O2 support - Continue 2L [place] QHS, given inability to tolerate CPAP - Wean O2 for sat > 90% - Bronchodilators PRN - May benefit from spacer for home inhaler use, given difficulty with coordination - Pulmonary hygiene - Outpatient pulmonary f/u  Best Practice: (right click and "Reselect all SmartList Selections" daily)   Per Primary Team  Labs:  CBC: Recent Labs  Lab 06/15/21 1712 06/16/21 0644 06/17/21 0525 06/18/21 0438  WBC 7.3 5.2 8.5 8.7  NEUTROABS 5.3  --   --   --   HGB 7.4* 8.3* 8.8* 8.5*  HCT 24.8* 26.6* 28.8* 28.0*  MCV 88.6 86.1 87.3 89.5  PLT 392 403* 400 382   Basic Metabolic Panel: Recent Labs  Lab 06/15/21 1712 06/16/21 0644  NA 137 136  K 4.2 4.0  CL 93* 95*  CO2 33* 32  GLUCOSE 98 240*  BUN 52* 50*  CREATININE 2.00* 1.59*  CALCIUM 8.7* 8.7*   GFR: Estimated Creatinine Clearance: 28.8 mL/min (A) (by C-G formula based on SCr of 1.59 mg/dL (H)). Recent Labs  Lab 06/15/21 1712 06/16/21 0644 06/17/21 0525 06/18/21 0438  WBC 7.3 5.2 8.5 8.7   Liver  Function Tests: No results for input(s): AST, ALT, ALKPHOS, BILITOT, PROT, ALBUMIN in the last 168 hours. No results for input(s): LIPASE, AMYLASE in the last 168 hours. No results for input(s): AMMONIA in the last 168 hours.  ABG    Component Value Date/Time   PHART 7.382 12/20/2019 1842   PCO2ART 43.3 12/20/2019 1842   PO2ART 225 (H) 12/20/2019 1842   HCO3 25.7 12/20/2019 1842   TCO2 27 12/20/2019 1842   O2SAT 100.0 12/20/2019 1842    Coagulation Profile: No results for input(s): INR, PROTIME in the last 168 hours.  Cardiac Enzymes: No results for input(s): CKTOTAL, CKMB, CKMBINDEX, TROPONINI in the last 168 hours.  HbA1C: No results found for: HGBA1C  CBG: Recent Labs  Lab 06/16/21 1751  GLUCAP 127*   Review of Systems:   Review of systems completed with pertinent positives/negatives outlined in above HPI.  Past Medical History:  She,  has a past medical history of Anemia, Cardiomegaly, Chronic diastolic CHF (congestive heart failure) ([place]), Depression, Diverticulosis, DOE (dyspnea on exertion), Esophageal reflux, GERD (gastroesophageal reflux disease), Hypothyroidism, Insomnia, LBP (low back  pain), OAB (overactive bladder), Obesity, OSA (obstructive sleep apnea), Osteoarthritis, Paroxysmal atrial fibrillation ([place]), Rheumatoid arthritis(714.0), Shoulder pain, bilateral, Unspecified essential hypertension, and Venous insufficiency.   Surgical History:   Past Surgical History:  Procedure Laterality Date   ABDOMINAL AORTIC ENDOVASCULAR STENT GRAFT N/A 12/20/2019   Procedure: Aortogram including catheter selection of aorta and bilateral iliac arteriogram, Endovascular repair of infrarenal abdominal aortic aneurysm with bifurcated stent graft (26 mm x 14 x 12 main body, right bell bottom with a 20 mm x 10 cm piece, and left bell bottom with a 16 mm x 12 cm piece) ;  Surgeon: Marty Heck, MD;  Location: [place];  Service: Vascular;  Laterality: N/A;   APPENDECTOMY  1953    bladder abduction-1996  1996   breast biopsy Right 1980   CARDIOVERSION N/A 12/24/2019   Procedure: CARDIOVERSION;  Surgeon: Adrian Prows, MD;  Location: [facility] ENDOSCOPY;  Service: Cardiovascular;  Laterality: N/A;   [place] N/A 04/10/2015   Procedure: FLEXIBLE SIGMOIDOSCOPY;  Surgeon: Arta Silence, MD;  Location: [facility] ENDOSCOPY;  Service: Endoscopy;  Laterality: N/A;   knee arthroscopy Right 1996, 2010   KNEE ARTHROSCOPY W/ AUTOGENOUS CARTILAGE IMPLANTATION (ACI) PROCEDURE Left 1994, 1995   REFRACTIVE SURGERY  01/2020   SHOULDER SURGERY  1990   TEE WITHOUT CARDIOVERSION N/A 12/24/2019   Procedure: TRANSESOPHAGEAL ECHOCARDIOGRAM (TEE);  Surgeon: Adrian Prows, MD;  Location: [place];  Service: Cardiovascular;  Laterality: N/A;   [place] Bilateral 12/20/2019   Procedure: Ultrasound-guided access of bilateral common femoral arteries for delivery of endograft and percutaneous closure;  Surgeon: Marty Heck, MD;  Location: [place];  Service: Vascular;  Laterality: Bilateral;   VESICOVAGINAL FISTULA CLOSURE W/ TAH     WRIST SURGERY  1967    Social History:   reports that she quit smoking about 37 years ago. Her smoking use included cigarettes. She has a 2.00 pack-year smoking history. She has never used smokeless tobacco. She reports current alcohol use. She reports that she does not use drugs.   Family History:  Her family history includes Breast cancer in her maternal aunt; COPD in her father; Heart attack (age of onset: 45) in her maternal grandmother; Lung cancer in her mother and son.   Allergies: Allergies  Allergen Reactions   Statins Other (See Comments)    Muscle aches and INTERNAL BLEEDING   Atorvastatin     Other reaction(s): Myalgias   Gabapentin Other (See Comments)    "Made me loopy"   Methocarbamol  Other (See Comments)    "Made me loopy"    Home Medications: Prior to Admission medications   Medication Sig Start Date End Date Taking? Authorizing Provider  albuterol (VENTOLIN HFA) 108 (90 Base) MCG/ACT inhaler Inhale 2 puffs into the lungs every 6 (six) hours as needed for wheezing or shortness of breath. 06/10/21  Yes Cantwell, [name] C, PA-C  amiodarone (PACERONE) 100 MG tablet Take 2 tablets (200 mg total) by mouth daily. 04/16/21  Yes Cantwell, [name] C, PA-C  apixaban (ELIQUIS) 2.5 MG TABS tablet Take 1 tablet (2.5 mg total) by mouth 2 (two) times daily. 02/16/21  Yes Cantwell, [name] C, PA-C  Bempedoic Acid-Ezetimibe (NEXLIZET) 180-10 MG TABS Take 1 capsule by mouth daily. Patient taking differently: Take 1 capsule by mouth daily. Taking it at  bedtime 02/11/19  Yes Adrian Prows, MD  carvedilol (COREG) 12.5 MG tablet TAKE ONE TABLET BY MOUTH AT BREAKFAST AND AT BEDTIME Patient taking differently: Take 12.5 mg by mouth 2 (two) times daily with a meal. 10/13/20  Yes Cantwell, [name] C, PA-C  Cholecalciferol (VITAMIN D3) 125 MCG (5000 UT) CAPS Take 5,000 Units by mouth at bedtime.   Yes [provider]  furosemide (LASIX) 20 MG tablet Take 1 tablet (20 mg total) by mouth daily as needed for fluid or edema. Take Lasix 20 mg once daily with an additional dose as needed for fluid overload. Patient taking differently: Take 20 mg by mouth daily. Take '20mg'$  if needed for fluid overload 11/16/20  Yes Cantwell, [name] C, PA-C  HYDROcodone-acetaminophen (NORCO) 7.5-325 MG tablet Take 1 tablet by mouth every 6 (six) hours as needed for moderate pain. 04/13/15  Yes Barton Dubois, MD  isosorbide-hydrALAZINE (BIDIL) 20-37.5 MG tablet Take 1 tablet by mouth 3 (three) times daily.   Yes [provider]  latanoprost (XALATAN) 0.005 % ophthalmic solution Place 1 drop into both eyes at bedtime.   Yes [provider]  levothyroxine (SYNTHROID) 125 MCG tablet Take 125 mcg by mouth daily before  breakfast.    Yes [provider]  losartan (COZAAR) 100 MG tablet TAKE ONE TABLET BY MOUTH AT NOON Patient taking differently: Take 100 mg by mouth daily at 12 noon. 06/03/21  Yes Cantwell, [name] C, PA-C  Multiple Vitamins-Minerals (ICAPS AREDS 2 PO) Take 1 capsule by mouth 2 (two) times daily.   Yes [provider]  omeprazole (PRILOSEC) 20 MG capsule Take 1 capsule (20 mg total) by mouth daily. 02/11/20  Yes Cantwell, [name] C, PA-C  rOPINIRole (REQUIP) 1 MG tablet Take 1 mg by mouth See admin instructions. Take 1 mg by mouth once a day between 3 PM-4 PM and an additional 1 mg in the evening, if no relief 06/19/15  Yes [provider]  albuterol (PROVENTIL) (2.5 MG/3ML) 0.083% nebulizer solution Take 2.5 mg by nebulization every 6 (six) hours as needed for wheezing or shortness of breath. Patient not taking: Reported on 06/15/2021    [provider]  amLODipine (NORVASC) 10 MG tablet TAKE ONE TABLET BY MOUTH AT NOON 06/18/21   Cantwell, [name] C, PA-C  diclofenac sodium (VOLTAREN) 1 % GEL Apply 2.25 g topically 3 (three) times daily as needed (for pain).  10/23/18   [provider]    Signature:   Lestine Mount, PA-C  Pulmonary & Critical Care 06/18/21 11:47 AM  Please see [email or web address] for pager details.  From 7A-7P if no response, please call [phone] After hours, please call ELink [phone]

## 2021-06-18 NOTE — TOC Progression Note (Signed)
Transition of Care (TOC) - Progression Note  ? ? ?Patient Details  ?Name: Lisa Mcclure ?MRN: [id number] ?Date of Birth: 09/13/1932 ? ?Transition of Care (TOC) CM/SW Contact  ?Dessa Phi, RN ?Phone Number: ?06/18/2021, 9:57 AM ? ?Clinical Narrative:  Received call from dtr [name] about d/c plans-patient uses [facility] for home 02 @ night. If qualifies for home 02 continuous will need documented 02 sats, & home 02 order to arrange.Lenn Cal already following for HHPT.  ? ? ? ?Expected Discharge Plan: [place] ?Barriers to Discharge: Continued Medical Work up ? ?Expected Discharge Plan and Services ?Expected Discharge Plan: [place] ?  ?Discharge Planning Services: CM Consult ?Post Acute Care Choice: Home Health ?Living arrangements for the past 2 months: [place] ?                ?  ?  ?  ?  ?  ?HH Arranged: PT ?[place] Agency: [place] ?Date HH Agency Contacted: 06/17/21 ?Time [place]: 1200 ?Representative spoke with at [place]: Tommi Rumps ? ? ?Social Determinants of Health (SDOH) Interventions ?  ? ?Readmission Risk Interventions ?Readmission Risk Prevention Plan 12/25/2019  ?Transportation Screening Complete  ?PCP or Specialist Appt within 3-5 Days Complete  ?[place] or Home Care Consult Complete  ?Social Work Consult for [place] Planning/Counseling Complete  ?Palliative Care Screening Not Applicable  ?Medication Review Press photographer) Complete  ?Some recent data might be hidden  ? ? ?

## 2021-06-18 NOTE — TOC Progression Note (Signed)
Transition of Care (TOC) - Progression Note  ? ? ?Patient Details  ?Name: Lisa Mcclure ?MRN: [id number] ?Date of Birth: 01-Apr-1933 ? ?Transition of Care (TOC) CM/SW Contact  ?Tawanna Cooler, RN ?Phone Number: ?06/18/2021, 2:12 PM ? ?Clinical Narrative:    ? ?Patient qualifies for continuous home O2.  Currently on service with [facility] for her nocturnal O2, wants to continue with them.   ? ?Called [facility] and spoke with [place]. She confirms patient is on service with them and they can fill the O2 order today.  They will bring a tank to the hospital today.  ? ? ?Expected Discharge Plan: [place] ?Barriers to Discharge: Continued Medical Work up ? ?Expected Discharge Plan and Services ?Expected Discharge Plan: [place] ?  ?Discharge Planning Services: CM Consult ?Post Acute Care Choice: Home Health ?Living arrangements for the past 2 months: [place] ?                ?   ?HH Arranged: PT ?[place] Agency: [place] ?Date HH Agency Contacted: 06/17/21 ?Time [place]: 1200 ?Representative spoke with at [place]: Tommi Rumps ? ? ? ? ?Readmission Risk Interventions ?Readmission Risk Prevention Plan 12/25/2019  ?Transportation Screening Complete  ?PCP or Specialist Appt within 3-5 Days Complete  ?[place] or Home Care Consult Complete  ?Social Work Consult for [place] Planning/Counseling Complete  ?Palliative Care Screening Not Applicable  ?Medication Review Press photographer) Complete  ?Some recent data might be hidden  ? ? ?

## 2021-06-18 NOTE — Op Note (Signed)
[facility] ?Patient Name: Lisa Mcclure ?Procedure Date: 06/18/2021 ?MRN: [id number] ?Attending MD: Ronnette Juniper , MD ?Date of Birth: 1932/05/27 ?CSN: [id number] ?Age: 88 ?Admit Type: Inpatient ?Procedure:                Upper GI endoscopy ?Indications:              Unexplained iron deficiency anemia ?Providers:                Ronnette Juniper, MD, Burtis Junes, RN, Janie Billups,  ?                          Technician, Eliberto Ivory ?Referring MD:             [facility] ?Medicines:                Monitored Anesthesia Care ?Complications:            No immediate complications. Estimated blood loss:  ?                          Minimal. ?Estimated Blood Loss:     Estimated blood loss was minimal. ?Procedure:                Pre-Anesthesia Assessment: ?                          - Prior to the procedure, a History and Physical  ?                          was performed, and patient medications and  ?                          allergies were reviewed. The patient's tolerance of  ?                          previous anesthesia was also reviewed. The risks  ?                          and benefits of the procedure and the sedation  ?                          options and risks were discussed with the patient.  ?                          All questions were answered, and informed consent  ?                          was obtained. Prior Anticoagulants: The patient has  ?                          taken Eliquis (apixaban), last dose was 2 days  ?                          prior to procedure. ASA Grade Assessment: III - A  ?  patient with severe systemic disease. After  ?                          reviewing the risks and benefits, the patient was  ?                          deemed in satisfactory condition to undergo the  ?                          procedure. ?                          After obtaining informed consent, the endoscope was  ?                          passed under direct vision.  Throughout the  ?                          procedure, the patient's blood pressure, pulse, and  ?                          oxygen saturations were monitored continuously. The  ?                          [id number]-[id number] ([id number]) Olympus endoscope was introduced  ?                          through the mouth, and advanced to the second part  ?                          of duodenum. The upper GI endoscopy was  ?                          accomplished without difficulty. The patient  ?                          tolerated the procedure well. ?Scope In: ?Scope Out: ?Findings: ?     The examined esophagus was normal. ?     The Z-line was regular and was found 36 cm from the incisors. ?     One non-bleeding superficial gastric ulcer with a clean ulcer base  ?     (Forrest Class III) was found in the gastric body. The lesion was 8 mm  ?     in largest dimension. Biopsies were taken with a cold forceps for  ?     histology. ?     The cardia and gastric fundus were normal on retroflexion. ?     Localized mildly erythematous mucosa without bleeding was found in the  ?     gastric antrum. Biopsies were taken with a cold forceps for Helicobacter  ?     pylori testing. ?     The examined duodenum was normal. ?Impression:               - Normal esophagus. ?                          -  Z-line regular, 36 cm from the incisors. ?                          - Non-bleeding gastric ulcer with a clean ulcer  ?                          base (Forrest Class III). Biopsied. ?                          - Erythematous mucosa in the antrum. Biopsied. ?                          - Normal examined duodenum. ?Moderate Sedation: ?     Patient did not receive moderate sedation for this procedure, but  ?     instead received monitored anesthesia care. ?Recommendation:           - Resume regular diet. ?                          - Continue present medications. ?                          - Await pathology results. ?                          - Use Protonix  (pantoprazole) 40 mg PO BID for 2  ?                          months. ?                          - PPI once a day thereafter as long as patient is  ?                          on Eliquis. ?                          - Resume Eliquis (apixaban) at prior dose tomorrow. ?Procedure Code(s):        --- Professional --- ?                          [phone], Esophagogastroduodenoscopy, flexible,  ?                          transoral; with biopsy, single or multiple ?Diagnosis Code(s):        --- Professional --- ?                          K25.9, Gastric ulcer, unspecified as acute or  ?                          chronic, without hemorrhage or perforation ?                          K31.89, Other diseases of stomach and duodenum ?  D50.9, Iron deficiency anemia, unspecified ?CPT copyright 2019 American Medical Association. All rights reserved. ?The codes documented in this report are preliminary and upon coder review may  ?be revised to meet current compliance requirements. ?Ronnette Juniper, MD ?06/18/2021 7:57:43 AM ?This report has been signed electronically. ?Number of Addenda: 0 ?

## 2021-06-18 NOTE — Progress Notes (Signed)
[place] for heparin ?Indication: atrial fibrillation ? ?Allergies  ?Allergen Reactions  ? Statins Other (See Comments)  ?  Muscle aches and INTERNAL BLEEDING  ? Atorvastatin   ?  Other reaction(s): Myalgias  ? Gabapentin Other (See Comments)  ?  "Made me loopy"  ? Methocarbamol Other (See Comments)  ?  "Made me loopy"  ? ? ?Patient Measurements: ?Height: '5\' 3"'$  (160 cm) ?Weight: 100.8 kg (222 lb 3.6 oz) ?IBW/kg (Calculated) : 52.4 ?Heparin Dosing Weight: 74.6 kg ? ?Vital Signs: ?Temp: 98.2 ?F (36.8 ?C) (03/10 0756) ?Temp Source: Oral (03/10 0756) ?BP: 137/55 (03/10 0816) ?Pulse Rate: 58 (03/10 0816) ? ?Labs: ?Recent Labs  ?  06/15/21 ?1712 06/16/21 ?0644 06/17/21 ?8676 06/17/21 ?1249 06/18/21 ?0438  ?HGB 7.4* 8.3* 8.8*  --  8.5*  ?HCT 24.8* 26.6* 28.8*  --  28.0*  ?PLT 392 403* 400  --  367  ?APTT  --   --   --  32  --   ?HEPARINUNFRC  --   --   --  0.67  --   ?CREATININE 2.00* 1.59*  --   --   --   ? ? ? ?Estimated Creatinine Clearance: 28.8 mL/min (A) (by C-G formula based on SCr of 1.59 mg/dL (H)). ? ?Assessment: ?88 yo w/ symptomatic anemia.  Pt on apixaban 2.5 mg po BID PTA for PAF. Last dose 3/8 0022 am. ?Pharmacy consulted for heparin bridge while apixaban on hold.  ?1 U PRBC given 3/8. Hg up to 8.8. SCr 1.59 ?06/18/2021 ?S/p EGD this morning w/ findings of 1 non-bleeding gastric ulcer, biopsies taken ?Heparin off last PM at 2330 ?Per GI> OK to resume heparin 4 hrs s/p EGD and resume apixaban 3/11 ?Hg stable at 8.5, PLT WNL ?Baseline heparin level on 3/9: heparin level 0.67 & aPTT 32 sec ? ?Goal of Therapy:  ?Heparin level 0.3-0.7 units/ml ?aPTT 66-102 seconds ?Monitor platelets by anticoagulation protocol: Yes ?  ?Plan:  ?resume heparin drip @ 1050 units/hr 4 hrs s/p EDG at 12 noon and check 8 hr aPTT and heparin level ?Will monitor using aPTTs until apixaban effect wears off and heparin levels correlate ?Daily CBC, aPTT, heparin level ?F/u plans to resume apixaban 3/11  per GI recs ? ?Eudelia Bunch, Pharm.D ?06/18/2021 9:58 AM ? ? ?

## 2021-06-18 NOTE — Progress Notes (Signed)
On arrival to endo patient sats were 78 on room air.  Patient was placed on 4LNC with sats coming back into the 90s.  Anesthesia notified and breathing treatment given.  Patient remained on [facility] in pre and post procedure.  RN on floor made aware of oxygen saturation and need for oxygen.  RT notified of breathing treatment while in endo.  Patient returned to floor on 2L. ?

## 2021-06-18 NOTE — Progress Notes (Signed)
?  ?  SATURATION QUALIFICATIONS: (This note is used to comply with regulatory documentation for home oxygen) ?  ?Patient Saturations on Room Air at Rest = 94% ?  ?Patient Saturations on Room Air while Ambulating = 83% ?  ?Patient Saturations on 2 L while ambulating = 87% ?  ?Patient Saturations on 3 Liters of oxygen while Ambulating = 98% ?  ? ?

## 2021-06-18 NOTE — Transfer of Care (Signed)
Immediate Anesthesia Transfer of Care Note ? ?Patient: Lisa Mcclure ? ?Procedure(s) Performed: Procedure(s): ?ESOPHAGOGASTRODUODENOSCOPY (EGD) WITH PROPOFOL (N/A) ?BIOPSY ? ?Patient Location: PACU and Endoscopy Unit ? ?Anesthesia Type:MAC ? ?Level of Consciousness: awake, alert  and oriented ? ?Airway & Oxygen Therapy: Patient Spontanous Breathing and Patient connected to nasal cannula oxygen ? ?Post-op Assessment: Report given to RN and Post -op Vital signs reviewed and stable ? ?Post vital signs: Reviewed and stable ? ?Last Vitals:  ?Vitals:  ? 06/18/21 0606 06/18/21 0714  ?BP:  (!) 172/50  ?Pulse:  (!) 59  ?Resp:  15  ?Temp:  36.5 ?C  ?SpO2: 93% 100%  ? ? ?Complications: No apparent anesthesia complications ? ?

## 2021-06-18 NOTE — Progress Notes (Signed)
PROGRESS NOTE  Lisa Mcclure ZYY:[id number] DOB: 03-Jan-1933 DOA: 06/15/2021 PCP: Lisa Lopes, MD  HPI/Recap of past 24 hours: Lisa Mcclure is a 88 y.o. female with medical history significant for HFpEF (EF 55-60% 04/20/2021), paroxysmal atrial fibrillation on Eliquis, CKD stage IIIb, HTN, hypothyroidism, small airway disease, anemia, venous insufficiency, AAA s/p endovascular stent graft (12/2019), and OSA intolerant to CPAP using supplemental O2 via [place] 2L qhs who presented to the ED for evaluation of worsening exertional dyspnea, worsening chronic congestion, fatigue and generalized weakness.  She has been less mobile than usual due to issues with her chronic osteoarthritis.  Work-up revealed symptomatic anemia with hemoglobin of 7.4, severe iron deficiency, she is post 1 unit PRBCs transfusion on 06/16/2021.  Seen by GI, post EGD on 06/18/2021.  It revealed: non-bleeding gastric ulcer with a clean ulcer base (Forrest Class III). Biopsied.  Erythematous mucosa in the antrum. Biopsied.  Normal examined duodenum.  06/18/2021: Patient was seen at her bedside.  Her daughter was present in the room.  She reports worsening chest congestion.  States she has followed up with Lisa Mcclure in the past regarding this issue.  CT chest without contrast ordered, pulmonary consulted.  Assessment/Plan: Principal Problem:   Acute respiratory failure with hypoxia (HCC) Active Problems:   Symptomatic anemia   Acute renal failure superimposed on stage 3b chronic kidney disease (HCC)   Chronic heart failure with preserved ejection fraction (HFpEF) (HCC)   Paroxysmal atrial fibrillation (HCC)   OSA (obstructive sleep apnea)   Hypertension   Hypothyroidism   RLS (restless legs syndrome)   Generalized weakness  Acute respiratory failure with hypoxia (Lisa Mcclure) secondary to atelectasis Patient prior to admission with exertional dyspnea with SPO2 as low as 86% while on 2 L O2 via Lisa Mcclure.   Likely multifactorial  related to symptomatic anemia, atelectasis, sleep apnea, obesity, and deconditioning.   CXR without evidence of pneumonia or pulmonary edema. Reports worsening chest congestion on 06/18/2021, pulmonary consulted. Follow CT chest without contrast. DuoNeb scheduled and as needed Mucinex 1200 mg twice daily, Robitussin syrup as needed Continue incentive spirometer Continue to mobilize as tolerated   Symptomatic anemia Hemoglobin 7.4 compared to usual baseline between 9-10.  She reports adherence to Eliquis and has not had any obvious bleeding.  FOBT was negative.  Anemia panel consistent with iron deficiency. Hemoglobin 8.3 post 1 unit PRBC transfusion. Repeat hemoglobin 8.8 on 06/17/2021> 8.5. No overt bleeding.  Severe iron deficiency, rule out GI as a source Iron studies done on 06/15/2021 IV Feraheme x1 dose on 06/16/21 Start oral ferrous sulfate after EGD on 06/19/2021.  Essential hypertension, BPs have been soft Hold off home Norvasc Currently on Coreg 6.25 mg twice daily, losartan 100 mg daily, 20 mg p.o. Lasix daily, amiodarone 200 mg p.o. daily. Maintain MAP >65 Continue to closely monitor vital signs.  Paroxysmal atrial fibrillation (HCC) Stable with controlled rate and rhythm on admission. -Continue amiodarone, Coreg Eliquis on hold since 06/16/2021, okay to restart on 06/19/2021 per GI. Heparin drip, stop after midnight in anticipation for EGD on 06/18/2021, resume 4 hours post EGD as recommended by GI.   Acute renal failure superimposed on stage 3b chronic kidney disease (HCC) Creatinine 2.00 on admission.  Baseline labs show variable creatinine, usually between 1.2-1.5.   AKI resolved, back to baseline creatinine 1.5 with GFR 31. IV fluid DC'd on 06/16/21 to avoid volume overload with elevated BNP greater than 300.  Chronic heart failure with preserved ejection fraction (HFpEF) ([place])  Last EF 55-60% by TTE 04/20/2021.  BNP greater than 300 Monitor volume status Continue strict I's  and O's and daily weight    Generalized weakness Secondary to symptomatic anemia and chronic osteoarthritis with deconditioning. PT OT consulted and recommended home health PT OT. Continue to mobilize as tolerated with fall precautions   RLS (restless legs syndrome) Continue home Requip.   Hypothyroidism Continue home Synthroid.  Hyperlipidemia Resume home regimen.   OSA (obstructive sleep apnea) History of OSA intolerant to CPAP.  Has not been wearing oxygen at night regularly. -Continue supplemental oxygen nightly and as needed during the day Management per pulmonary, she will need to follow-up with pulmonary outpatient.     DVT prophylaxis: Heparin drip Code Status: DNR, confirmed with patient on admission. Family Communication: None at bedside.    Disposition Plan: From home, likely home with home health PT OT once GI signs off.  Consults called: GI, cardiology, pulmonary.  Severity of Illness:   Inpatient status.  Patient requires at least 2 midnights for further evaluation and treatment of present condition.  Objective: Vitals:   06/18/21 0816 06/18/21 0856 06/18/21 1216 06/18/21 1455  BP: (!) 137/55  (!) 127/47   Pulse: (!) 58  (!) 55   Resp: 12  19   Temp:   97.8 F (36.6 C)   TempSrc:   Oral   SpO2: 98% 95% 97% 97%  Weight:      Height:        Intake/Output Summary (Last 24 hours) at 06/18/2021 1531 Last data filed at 06/18/2021 1252 Gross per 24 hour  Intake 1433.49 ml  Output 1000 ml  Net 433.49 ml   Filed Weights   06/17/21 0600 06/18/21 0500 06/18/21 0714  Weight: 98.4 kg 100.8 kg 100.8 kg    Exam:  General: 88 y.o. year-old female frail-appearing in no acute stress.  She is alert and oriented x3.   Cardiovascular: Regular rate and rhythm no rubs or gallops.   Respiratory: Clear to auscultation no wheezes or rales. Abdomen: Soft nontender normal bowel sounds present. Musculoskeletal: Trace lower extremity edema bilaterally. Skin: No  ulcerative lesions noted. Psychiatry: Mood is appropriate for condition and setting. Neuro: Nonfocal exam.   Data Reviewed: CBC: Recent Labs  Lab 06/15/21 1712 06/16/21 0644 06/17/21 0525 06/18/21 0438  WBC 7.3 5.2 8.5 8.7  NEUTROABS 5.3  --   --   --   HGB 7.4* 8.3* 8.8* 8.5*  HCT 24.8* 26.6* 28.8* 28.0*  MCV 88.6 86.1 87.3 89.5  PLT 392 403* 400 332   Basic Metabolic Panel: Recent Labs  Lab 06/15/21 1712 06/16/21 0644  NA 137 136  K 4.2 4.0  CL 93* 95*  CO2 33* 32  GLUCOSE 98 240*  BUN 52* 50*  CREATININE 2.00* 1.59*  CALCIUM 8.7* 8.7*   GFR: Estimated Creatinine Clearance: 28.8 mL/min (A) (by C-G formula based on SCr of 1.59 mg/dL (H)). Liver Function Tests: No results for input(s): AST, ALT, ALKPHOS, BILITOT, PROT, ALBUMIN in the last 168 hours. No results for input(s): LIPASE, AMYLASE in the last 168 hours. No results for input(s): AMMONIA in the last 168 hours. Coagulation Profile: No results for input(s): INR, PROTIME in the last 168 hours. Cardiac Enzymes: No results for input(s): CKTOTAL, CKMB, CKMBINDEX, TROPONINI in the last 168 hours. BNP (last 3 results) No results for input(s): PROBNP in the last 8760 hours. HbA1C: No results for input(s): HGBA1C in the last 72 hours. CBG: Recent Labs  Lab 06/16/21 1751  GLUCAP 127*   Lipid Profile: No results for input(s): CHOL, HDL, LDLCALC, TRIG, CHOLHDL, LDLDIRECT in the last 72 hours. Thyroid Function Tests: No results for input(s): TSH, T4TOTAL, FREET4, T3FREE, THYROIDAB in the last 72 hours. Anemia Panel: Recent Labs    06/15/21 2022  VITAMINB12 300  FOLATE 10.6  FERRITIN 13  TIBC 536*  IRON 15*  RETICCTPCT 2.2   Urine analysis:    Component Value Date/Time   COLORURINE STRAW (A) 09/13/2018 1740   APPEARANCEUR CLEAR 09/13/2018 1740   LABSPEC 1.008 09/13/2018 1740   PHURINE 7.0 09/13/2018 1740   GLUCOSEU NEGATIVE 09/13/2018 1740   HGBUR NEGATIVE 09/13/2018 1740   BILIRUBINUR NEGATIVE  09/13/2018 1740   KETONESUR NEGATIVE 09/13/2018 1740   PROTEINUR NEGATIVE 09/13/2018 1740   UROBILINOGEN 0.2 01/06/2009 0808   NITRITE NEGATIVE 09/13/2018 1740   LEUKOCYTESUR NEGATIVE 09/13/2018 1740   Sepsis Labs: '@LABRCNTIP'$ (procalcitonin:4,lacticidven:4)  ) Recent Results (from the past 240 hour(s))  Resp Panel by RT-PCR (Flu A&B, Covid) Nasopharyngeal Swab     Status: None   Collection Time: 06/15/21  5:16 PM   Specimen: Nasopharyngeal Swab; Nasopharyngeal(NP) swabs in vial transport medium  Result Value Ref Range Status   SARS Coronavirus 2 by RT PCR NEGATIVE NEGATIVE Final    Comment: (NOTE) SARS-CoV-2 target nucleic acids are NOT DETECTED.  The SARS-CoV-2 RNA is generally detectable in upper respiratory specimens during the acute phase of infection. The lowest concentration of SARS-CoV-2 viral copies this assay can detect is 138 copies/mL. A negative result does not preclude SARS-Cov-2 infection and should not be used as the sole basis for treatment or other patient management decisions. A negative result may occur with  improper specimen collection/handling, submission of specimen other than nasopharyngeal swab, presence of viral mutation(s) within the areas targeted by this assay, and inadequate number of viral copies(<138 copies/mL). A negative result must be combined with clinical observations, patient history, and epidemiological information. The expected result is Negative.  Fact Sheet for Patients:  EntrepreneurPulse.com.au  Fact Sheet for Healthcare Providers:  IncredibleEmployment.be  This test is no t yet approved or cleared by the [place] FDA and  has been authorized for detection and/or diagnosis of SARS-CoV-2 by FDA under an Emergency Use Authorization (EUA). This EUA will remain  in effect (meaning this test can be used) for the duration of the COVID-19 declaration under Section 564(b)(1) of the Act,  21 U.S.C.section 360bbb-3(b)(1), unless the authorization is terminated  or revoked sooner.       Influenza A by PCR NEGATIVE NEGATIVE Final   Influenza B by PCR NEGATIVE NEGATIVE Final    Comment: (NOTE) The Xpert Xpress SARS-CoV-2/FLU/RSV plus assay is intended as an aid in the diagnosis of influenza from Nasopharyngeal swab specimens and should not be used as a sole basis for treatment. Nasal washings and aspirates are unacceptable for Xpert Xpress SARS-CoV-2/FLU/RSV testing.  Fact Sheet for Patients: EntrepreneurPulse.com.au  Fact Sheet for Healthcare Providers: IncredibleEmployment.be  This test is not yet approved or cleared by the [place] FDA and has been authorized for detection and/or diagnosis of SARS-CoV-2 by FDA under an Emergency Use Authorization (EUA). This EUA will remain in effect (meaning this test can be used) for the duration of the COVID-19 declaration under Section 564(b)(1) of the Act, 21 U.S.C. section 360bbb-3(b)(1), unless the authorization is terminated or revoked.  Performed at [facility], [place] [address], [place], [place] [address]       Studies: No results found.  Scheduled  Meds:  amiodarone  200 mg Oral Daily   amLODipine  10 mg Oral Q1200   carvedilol  12.5 mg Oral BID WC   cholecalciferol  5,000 Units Oral Daily   ezetimibe  10 mg Oral Daily   ferrous sulfate  325 mg Oral Q breakfast   [START ON 06/19/2021] ferrous sulfate  325 mg Oral Q breakfast   furosemide  20 mg Oral Daily   guaiFENesin  1,200 mg Oral BID   ipratropium-albuterol  3 mL Nebulization TID   latanoprost  1 drop Both Eyes QHS   levothyroxine  125 mcg Oral QAC breakfast   losartan  100 mg Oral Q1200   multivitamin with minerals  1 tablet Oral Daily   pantoprazole  40 mg Oral BID   rOPINIRole  1 mg Oral Daily   sodium chloride flush  3 mL Intravenous Q12H    Continuous Infusions:  heparin 1,050 Units/hr  (06/18/21 1302)     LOS: 2 days     Kayleen Memos, MD [facility] Pager [phone]  If 7PM-7AM, please contact night-coverage [email or web address] Password [facility] 06/18/2021, 3:31 PM

## 2021-06-18 NOTE — Progress Notes (Signed)
Pharmacy Antibiotic Note ? ?Lisa Mcclure is a 88 y.o. female admitted on 06/15/2021. Concern for aspiration pneumonia 3/10. Pharmacy has been consulted for Unasyn dosing. ? ?Plan: ?Unasyn 3 g IV q12h ? ?Pharmacy will sign off consult and follow peripherally for any necessary dose adjustments. ? ?Height: '5\' 3"'$  (160 cm) ?Weight: 100.8 kg (222 lb 3.6 oz) ?IBW/kg (Calculated) : 52.4 ? ?Temp (24hrs), Avg:97.8 ?F (36.6 ?C), Min:97.6 ?F (36.4 ?C), Max:98.2 ?F (36.8 ?C) ? ?Recent Labs  ?Lab 06/15/21 ?1712 06/16/21 ?0644 06/17/21 ?6213 06/18/21 ?0865  ?WBC 7.3 5.2 8.5 8.7  ?CREATININE 2.00* 1.59*  --   --   ?  ?Estimated Creatinine Clearance: 28.8 mL/min (A) (by C-G formula based on SCr of 1.59 mg/dL (H)).   ? ?Allergies  ?Allergen Reactions  ? Statins Other (See Comments)  ?  Muscle aches and INTERNAL BLEEDING  ? Atorvastatin   ?  Other reaction(s): Myalgias  ? Gabapentin Other (See Comments)  ?  "Made me loopy"  ? Methocarbamol Other (See Comments)  ?  "Made me loopy"  ? ? ?Antimicrobials this admission: ?Unasyn 3/10 >> ? ?Dose adjustments this admission: NA ? ?Microbiology results: ?3/10 Respiratory panel: pending ? ?Thank you for allowing pharmacy to be a part of this patient?s care. ? ?Tawnya Crook, PharmD, BCPS ?Clinical Pharmacist ?06/18/2021 6:14 PM ? ? ?

## 2021-06-18 NOTE — Anesthesia Postprocedure Evaluation (Signed)
Anesthesia Post Note ? ?Patient: MAIRA CHRISTON ? ?Procedure(s) Performed: ESOPHAGOGASTRODUODENOSCOPY (EGD) WITH PROPOFOL ?BIOPSY ? ?  ? ?Patient location during evaluation: PACU ?Anesthesia Type: MAC ?Level of consciousness: awake and alert ?Pain management: pain level controlled ?Vital Signs Assessment: post-procedure vital signs reviewed and stable ?Respiratory status: spontaneous breathing, nonlabored ventilation, respiratory function stable and patient connected to nasal cannula oxygen ?Cardiovascular status: stable and blood pressure returned to baseline ?Postop Assessment: no apparent nausea or vomiting ?Anesthetic complications: no ? ? ?No notable events documented. ? ?Last Vitals:  ?Vitals:  ? 06/18/21 0856 06/18/21 1216  ?BP:  (!) 127/47  ?Pulse:  (!) 55  ?Resp:  19  ?Temp:  36.6 ?C  ?SpO2: 95% 97%  ?  ?Last Pain:  ?Vitals:  ? 06/18/21 1216  ?TempSrc: Oral  ?PainSc:   ? ? ?  ?  ?  ?  ?  ?  ? ?Suzette Battiest E ? ? ? ? ?

## 2021-06-18 NOTE — Progress Notes (Signed)
Physical Therapy Treatment ?Patient Details ?Name: Lisa Mcclure ?MRN: [id number] ?DOB: 07/26/1932 ?Today's Date: 06/18/2021 ? ? ?History of Present Illness 88 year old female admitted with dyspnea. PMH: HFpEF, paroxysmal atrial fibrillation on Eliquis, CKD stage IIIb, HTN, hypothyroidism, small airway disease, anemia, venous insufficiency, AAA s/p endovascular stent graft, and OSA intolerant to CPAP using supplemental O2 via [place] ? ?  ?PT Comments  ? ? Pt making good progress with mobility.  Does still have DOE with decreased O2 sats with activity.  Educated on gradually increasing endurance, monitoring sats and symptoms, and incentive spirometer.  ? ?SATURATION QUALIFICATIONS: (This note is used to comply with regulatory documentation for home oxygen) ? ?Patient Saturations on Room Air at Rest = 94% ? ?Patient Saturations on Room Air while Ambulating = 83% ? ?Patient Saturations on 2 L while ambulating = 87% ? ?Patient Saturations on 3 Liters of oxygen while Ambulating = 98% ? ?Please briefly explain why patient needs home oxygen: Required 3 L to maintain O2 with activity.  ?  ?Recommendations for follow up therapy are one component of a multi-disciplinary discharge planning process, led by the attending physician.  Recommendations may be updated based on patient status, additional functional criteria and insurance authorization. ? ?Follow Up Recommendations ? Home health PT ?  ?  ?Assistance Recommended at Discharge PRN  ?Patient can return home with the following Assist for transportation;Assistance with cooking/housework;Help with stairs or ramp for entrance ?  ?Equipment Recommendations ? None recommended by PT  ?  ?Recommendations for Other Services   ? ? ?  ?Precautions / Restrictions Precautions ?Precautions: Fall ?Precaution Comments: monitor sats  ?  ? ?Mobility ? Bed Mobility ?  ?  ?  ?  ?  ?  ?  ?General bed mobility comments: sitting at edge of bed upon arrival ?  ? ?Transfers ?Overall transfer level:  Needs assistance ?Equipment used: Rolling walker (2 wheels) ?Transfers: Sit to/from Stand ?Sit to Stand: Supervision ?  ?  ?  ?  ?  ?  ?  ? ?Ambulation/Gait ?Ambulation/Gait assistance: Supervision ?Gait Distance (Feet): 250 Feet ?Assistive device: Rolling walker (2 wheels) ?Gait Pattern/deviations: Step-through pattern ?Gait velocity: normal ?  ?  ?General Gait Details: Light use of RW, steady gait, standing rest break to monitor O2; dyspnea 2/4 ? ? ?Stairs ?  ?  ?  ?  ?  ? ? ?Wheelchair Mobility ?  ? ?Modified Rankin (Stroke Patients Only) ?  ? ? ?  ?Balance Overall balance assessment: Needs assistance ?Sitting-balance support: No upper extremity supported ?Sitting balance-Leahy Scale: Good ?  ?  ?Standing balance support: No upper extremity supported, Bilateral upper extremity supported ?Standing balance-Leahy Scale: Good ?Standing balance comment: RW to ambulate but could take some steps in room without AD ?  ?  ?  ?  ?  ?  ?  ?  ?  ?  ?  ?  ? ?  ?Cognition Arousal/Alertness: Awake/alert ?Behavior During Therapy: [facility] for tasks assessed/performed ?Overall Cognitive Status: Within Functional Limits for tasks assessed ?  ?  ?  ?  ?  ?  ?  ?  ?  ?  ?  ?  ?  ?  ?  ?  ?  ?  ?  ? ?  ?Exercises  Incentive spirometer x 3 to 750 mL with cues for correct use. ? ?  ?General Comments General comments (skin integrity, edema, etc.): Pt on 2 L at arrival sats 98%.  Tried RA rest  sats94%.  RA ambulate 83%, 2L ambulate 87%, 3L ambulate 98% ?  ?  ? ?Pertinent Vitals/Pain Pain Assessment ?Pain Assessment: No/denies pain  ? ? ?Home Living   ?  ?  ?  ?  ?  ?  ?  ?  ?  ?   ?  ?Prior Function    ?  ?  ?   ? ?PT Goals (current goals can now be found in the care plan section) Progress towards PT goals: Progressing toward goals ? ?  ?Frequency ? ? ? Min 3X/week ? ? ? ?  ?PT Plan Current plan remains appropriate  ? ? ?Co-evaluation   ?  ?  ?  ?  ? ?  ?AM-PAC PT "6 Clicks" Mobility   ?Outcome Measure ? Help needed turning from your back to  your side while in a flat bed without using bedrails?: None ?Help needed moving from lying on your back to sitting on the side of a flat bed without using bedrails?: None ?Help needed moving to and from a bed to a chair (including a wheelchair)?: A Little ?Help needed standing up from a chair using your arms (e.g., wheelchair or bedside chair)?: A Little ?Help needed to walk in hospital room?: A Little ?Help needed climbing 3-5 steps with a railing? : A Little ?6 Click Score: 20 ? ?  ?End of Session Equipment Utilized During Treatment: Oxygen ?Activity Tolerance: Patient tolerated treatment well ?Patient left: with call bell/phone within reach;with family/visitor present;in bed (mobilize in room w family - no alarm) ?Nurse Communication: Mobility status ?PT Visit Diagnosis: Unsteadiness on feet (R26.81);Difficulty in walking, not elsewhere classified (R26.2) ?  ? ? ?Time: 7412-8786 ?PT Time Calculation (min) (ACUTE ONLY): 22 min ? ?Charges:  $Gait Training: 8-22 mins          ?          ? ?Abran Richard, PT ?Acute Rehab Services ?Pager [phone] ?[facility] [phone] ? ? ? ?[name] ?06/18/2021, 11:20 AM ? ?

## 2021-06-18 NOTE — Anesthesia Preprocedure Evaluation (Addendum)
Anesthesia Evaluation  ?Patient identified by MRN, date of birth, ID band ?Patient awake ? ? ? ?Reviewed: ?Allergy & Precautions, NPO status , Patient's Chart, lab work & pertinent test results ? ?Airway ?Mallampati: II ? ?TM Distance: >3 FB ?Neck ROM: Full ? ? ? Dental ? ?(+) Dental Advisory Given ?  ?Pulmonary ?sleep apnea and Oxygen sleep apnea , COPD,  COPD inhaler, former smoker,  ?  ?breath sounds clear to auscultation ? ? ? ? ? ? Cardiovascular ?hypertension, Pt. on medications and Pt. on home beta blockers ?+ Peripheral Vascular Disease, +CHF and + DOE  ?+ dysrhythmias Atrial Fibrillation + Valvular Problems/Murmurs (Normal LVF. Moderate AS. Mild MR, Mild TR. Mild PHTN) AS  ?Rhythm:Regular Rate:Normal ? ? ?  ?Neuro/Psych ?negative neurological ROS ?   ? GI/Hepatic ?Neg liver ROS, GERD  ,  ?Endo/Other  ?Hypothyroidism  ? Renal/GU ?CRFRenal disease  ? ?  ?Musculoskeletal ? ?(+) Arthritis ,  ? Abdominal ?  ?Peds ? Hematology ? ?(+) Blood dyscrasia, anemia ,   ?Anesthesia Other Findings ? ? Reproductive/Obstetrics ? ?  ? ? ? ? ? ? ? ? ? ? ? ? ? ?  ?  ? ? ? ? ? ? ? ? ?Anesthesia Physical ?Anesthesia Plan ? ?ASA: 4 ? ?Anesthesia Plan: MAC  ? ?Post-op Pain Management: Minimal or no pain anticipated  ? ?Induction:  ? ?PONV Risk Score and Plan: 2 and Propofol infusion ? ?Airway Management Planned: Natural Airway and Nasal Cannula ? ?Additional Equipment:  ? ?Intra-op Plan:  ? ?Post-operative Plan:  ? ?Informed Consent: I have reviewed the patients History and Physical, chart, labs and discussed the procedure including the risks, benefits and alternatives for the proposed anesthesia with the patient or authorized representative who has indicated his/her understanding and acceptance.  ? ? ? ? ? ?Plan Discussed with:  ? ?Anesthesia Plan Comments:   ? ? ? ? ? ?Anesthesia Quick Evaluation ? ?

## 2021-06-18 NOTE — Interval H&P Note (Signed)
History and Physical Interval Note: ?88/female with anemia, was on eliquis, last dose 06/16/21, for an EGD. ? ?06/18/2021 ?7:37 AM ? ?Lisa Mcclure  has presented today for EGD, with the diagnosis of anemia, on eliquis.  The various methods of treatment have been discussed with the patient and family. After consideration of risks, benefits and other options for treatment, the patient has consented to  Procedure(s): ?ESOPHAGOGASTRODUODENOSCOPY (EGD) WITH PROPOFOL (N/A) as a surgical intervention.  The patient's history has been reviewed, patient examined, no change in status, stable for surgery.  I have reviewed the patient's chart and labs.  Questions were answered to the patient's satisfaction.   ? ? ?Ronnette Juniper ? ? ?

## 2021-06-19 DIAGNOSIS — J9601 Acute respiratory failure with hypoxia: Secondary | ICD-10-CM | POA: Diagnosis not present

## 2021-06-19 LAB — BRAIN NATRIURETIC PEPTIDE: B Natriuretic Peptide: 519.6 pg/mL — ABNORMAL HIGH (ref 0.0–100.0)

## 2021-06-19 LAB — EXPECTORATED SPUTUM ASSESSMENT W GRAM STAIN, RFLX TO RESP C

## 2021-06-19 LAB — BLOOD GAS, ARTERIAL
Acid-Base Excess: 12 mmol/L — ABNORMAL HIGH (ref 0.0–2.0)
Bicarbonate: 39.3 mmol/L — ABNORMAL HIGH (ref 20.0–28.0)
O2 Saturation: 98.3 %
Patient temperature: 37.1
pCO2 arterial: 62 mmHg — ABNORMAL HIGH (ref 32–48)
pH, Arterial: 7.41 (ref 7.35–7.45)
pO2, Arterial: 82 mmHg — ABNORMAL LOW (ref 83–108)

## 2021-06-19 LAB — TROPONIN I (HIGH SENSITIVITY): Troponin I (High Sensitivity): 9 ng/L (ref <18)

## 2021-06-19 LAB — PROCALCITONIN: Procalcitonin: <0.1 ng/mL

## 2021-06-19 MED ORDER — FUROSEMIDE 10 MG/ML IJ SOLN
20.0000 mg | Freq: Once | INTRAMUSCULAR | Status: AC
  Administered 2021-06-19: 20 mg via INTRAVENOUS
  Filled 2021-06-19: qty 2

## 2021-06-19 MED ORDER — ROPINIROLE HCL 1 MG PO TABS
1.0000 mg | ORAL_TABLET | Freq: Three times a day (TID) | ORAL | Status: DC | PRN
  Administered 2021-06-19 – 2021-06-21 (×6): 1 mg via ORAL
  Filled 2021-06-19 (×6): qty 1

## 2021-06-19 MED ORDER — LIP MEDEX EX OINT
TOPICAL_OINTMENT | CUTANEOUS | Status: DC | PRN
  Filled 2021-06-19: qty 7

## 2021-06-19 MED ORDER — HYDROXYZINE HCL 25 MG PO TABS
25.0000 mg | ORAL_TABLET | Freq: Once | ORAL | Status: AC
  Administered 2021-06-19: 25 mg via ORAL
  Filled 2021-06-19: qty 1

## 2021-06-19 MED ORDER — SODIUM CHLORIDE 3 % IN NEBU: 4.0000 mL | INHALATION_SOLUTION | Freq: Two times a day (BID) | RESPIRATORY_TRACT | Status: DC

## 2021-06-19 MED ORDER — SODIUM CHLORIDE 3 % IN NEBU
4.0000 mL | INHALATION_SOLUTION | Freq: Two times a day (BID) | RESPIRATORY_TRACT | Status: DC
  Administered 2021-06-19 – 2021-06-21 (×5): 4 mL via RESPIRATORY_TRACT
  Filled 2021-06-19 (×6): qty 4

## 2021-06-19 MED ORDER — ROPINIROLE HCL 1 MG PO TABS: 1.0000 mg | ORAL_TABLET | Freq: Three times a day (TID) | ORAL | Status: DC | PRN

## 2021-06-19 MED ORDER — APIXABAN 2.5 MG PO TABS
2.5000 mg | ORAL_TABLET | Freq: Two times a day (BID) | ORAL | Status: DC
  Administered 2021-06-19 – 2021-06-21 (×5): 2.5 mg via ORAL
  Filled 2021-06-19 (×5): qty 1

## 2021-06-19 NOTE — Progress Notes (Signed)
ANTICOAGULATION CONSULT NOTE  ? ?Pharmacy Consult for heparin>apixaban ?Indication: atrial fibrillation ? ?Allergies  ?Allergen Reactions  ? Statins Other (See Comments)  ?  Muscle aches and INTERNAL BLEEDING  ? Atorvastatin   ?  Other reaction(s): Myalgias  ? Gabapentin Other (See Comments)  ?  "Made me loopy"  ? Methocarbamol Other (See Comments)  ?  "Made me loopy"  ? ? ?Patient Measurements: ?Height: '5\' 3"'$  (160 cm) ?Weight: 101.2 kg (223 lb 1.7 oz) ?IBW/kg (Calculated) : 52.4 ?Heparin Dosing Weight: 74.6 kg ? ?Vital Signs: ?Temp: 98.2 ?F (36.8 ?C) (03/11 0445) ?Temp Source: Oral (03/11 0445) ?BP: 120/47 (03/11 0445) ?Pulse Rate: 56 (03/11 0445) ? ?Labs: ?Recent Labs  ?  06/16/21 ?0644 06/17/21 ?8469 06/17/21 ?1249 06/18/21 ?0438 06/18/21 ?2023  ?HGB 8.3* 8.8*  --  8.5*  --   ?HCT 26.6* 28.8*  --  28.0*  --   ?PLT 403* 400  --  367  --   ?APTT  --   --  32  --  74*  ?HEPARINUNFRC  --   --  0.67  --  0.61  ?CREATININE 1.59*  --   --   --   --   ? ? ? ?Estimated Creatinine Clearance: 28.8 mL/min (A) (by C-G formula based on SCr of 1.59 mg/dL (H)). ? ?Assessment: ?88 yo w/ symptomatic anemia.  Pt on apixaban 2.5 mg po BID PTA for PAF. Last dose 3/8 0022 am. ?Pharmacy consulted for heparin bridge while apixaban on hold.  ?1 U PRBC given 3/8. Hg up to 8.8. SCr 1.59 ?06/19/2021 ?S/p EGD this morning w/ findings of 1 non-bleeding gastric ulcer, biopsies taken ?Heparin off last PM at 2330 ?Per GI> OK to resume heparin 4 hrs s/p EGD and resume apixaban 3/11 ? ?Goal of Therapy:  ?Heparin level 0.3-0.7 units/ml ?aPTT 66-102 seconds ?Monitor platelets by anticoagulation protocol: Yes ?  ?Plan:  ?Stop heparin drip and labs ?Start apixaban 2.'5mg'$  po twice daily ? ?Dolly Rias RPh ?06/19/2021, 5:39 AM ? ? ? ?

## 2021-06-19 NOTE — Consult Note (Signed)
NAME:  Lisa Mcclure, MRN:  [id number], DOB:  1933-03-04, LOS: 3 ADMISSION DATE:  06/15/2021 CONSULTATION DATE:  06/18/2021 REFERRING MD:  Lisa Mcclure - [facility] CHIEF COMPLAINT:  Congestion, chronic cough, dyspnea  BRIEF  88 year old woman who presented to [facility] ED with worsening dyspnea. PMHx significant for HTN, HFpEF (Echo 1/10 with EF 55-60%), PAF (on Eliquis), chronic anemia, OSA (does not tolerate CPAP, wears 2LNC), mild restrictive lung disease (based on PFTs 2017) with question of small airway disease, GERD, diverticulosis, CKD stage IIIb, stress incontinence, former smoker.  Patient is seen up to chair at bedside.  Reports that she has had a persistent cough/upper airway congestion for approximately 6 months; despite interventions this has not resolved.  She had a similar chronic cough years ago for which she underwent extensive pulmonary work-up with Dr. Halford Mcclure (2012)/Dr. Vaughan Mcclure (2017).  This eventually resolved after several interventions, but she feels antibiotics were the most significant of these interventions.  Currently, she reports persistent productive cough, she is unable to get sputum up describing it as "Elmer's glue".  Reports that she has attempted to use an albuterol inhaler at home but struggles with this due to fine motor coordination.  It sounds as if she was provided with nebulizer vials but does not have a nebulizer at home.  She does utilize 2L [place] nightly for sleep apnea, as she is unable to tolerate her CPAP.  More recently she has required 2L [place] for activities requiring exertion such as making the bed, common ADLs. Denies fever/chills, recent sick contacts, CP, n/v/abdominal pain, dizziness or lightheadedness. Endorses SOB, DOE, LE edema (no worse than baseline).  PCCM consulted for assistance with management.    past   has a past medical history of Anemia, Cardiomegaly, Chronic diastolic CHF (congestive heart failure) ([place]), Depression, Diverticulosis, DOE (dyspnea on exertion),  Esophageal reflux, GERD (gastroesophageal reflux disease), Hypothyroidism, Insomnia, LBP (low back pain), OAB (overactive bladder), Obesity, OSA (obstructive sleep apnea), Osteoarthritis, Paroxysmal atrial fibrillation ([place]), Rheumatoid arthritis(714.0), Shoulder pain, bilateral, Unspecified essential hypertension, and Venous insufficiency.   has a past surgical history that includes Appendectomy (7253); Vesicovaginal fistula closure w/ TAH; breast biopsy (Right, 1980); Knee arthroscopy w/ autogenous cartilage implantation procedure (Left, 1994, 1995); knee arthroscopy (Right, 1996, 2010); Shoulder surgery (1990); Cystocele repair; Cesarean section; Wrist surgery (6644); Total abdominal hysterectomy (1972); bladder abduction-1996 (1996); Cosmetic surgery (1996); Flexible sigmoidoscopy (N/A, 04/10/2015); Abdominal aortic endovascular stent graft (N/A, 12/20/2019); Ultrasound guidance for vascular access (Bilateral, 12/20/2019); TEE without cardioversion (N/A, 12/24/2019); Cardioversion (N/A, 12/24/2019); and Refractive surgery (01/2020).    Significant Hospital Events: Including procedures, antibiotic start and stop dates in addition to other pertinent events   3.7 - Admitted for SOB, anemia 3/10 - PCCM consulted for chronic cough/chest congestion.  CT chest with scattered lower lobe infiltrates consistent with viral and may be aspiration and also fatty infiltration consistent with amiodarone intake..  Started on Unasyn empiric Upper endoscopy dR Lisa Mcclure One non-bleeding superficial gastric ulcer with a clean ulcer base (Forrest Class III) was found in the gastric body. The lesion was 8 mm in largest dimension. Biopsies were taken with a cold forceps for histology   Interim History / Subjective:    3/11-continues on 2 L nasal cannula with pulse ox 94-100%.  Getting Unasyn but continues to remain afebrile.  White count normal.  Started on Unasyn yesterday.  Troponin normal.  BNP elevated at 519.6 [2 days ago  was in the 300s].  She is on oral daily Lasix 20 mEq.  Daughter came on the phone.  Daughter states patient frequently talks needs and chokes on food and there is an ongoing issue.  Patient initially and yesterday and today was denying this.  She is having some pinkish colored sputum today she is collecting this in a sputum cup.  There is a change in color because normally it is clear.   Objective:  Blood pressure 118/61, pulse 62, temperature 98.2 F (36.8 C), temperature source Oral, resp. rate 18, height '5\' 3"'$  (1.6 m), weight 101.2 kg, SpO2 100 %.    FiO2 (%):  [28 %] 28 %   Intake/Output Summary (Last 24 hours) at 06/19/2021 1348 Last data filed at 06/19/2021 1013 Gross per 24 hour  Intake 502.89 ml  Output 1000 ml  Net -497.11 ml   Filed Weights   06/18/21 0500 06/18/21 0714 06/19/21 0449  Weight: 100.8 kg 100.8 kg 101.2 kg   Physical Examination: General Appearance: Obese lady sitting in the bed and chatting with her daughter has incentive spirometer and oxygen on  head:  Normocephalic, without obvious abnormality, atraumatic Eyes:  PERRL -yes, conjunctiva/corneas -clear     Ears:  Normal external ear canals, both ears Nose:  G tube -no but has nasal cannula 2 L Throat:  ETT TUBE -no, OG tube -no Neck:  Supple,  No enlargement/tenderness/nodules Lungs: Clear to auscultation bilaterally, Ventilator   Synchrony -not applicable Heart:  S1 and S2 normal, no murmur, CVP -no.  Pressors -no Abdomen:  Soft, no masses, no organomegaly Genitalia / Rectal:  Not done Extremities:  Extremities-intact Skin:  ntact in exposed areas . Sacral area -not examined Neurologic:  Sedation -none-> RASS -+1. Moves all 4s -yes. CAM-ICU -negative. Orientation -  x3     Resolved Hospital Problem List:    Assessment & Plan:   Lisa Mcclure is seen in consultation at the request of Dr. [place] Mcclure [facility]) for further evaluation and management of chronic cough/congestion.   88 year old woman who presented  to [facility] ED with worsening dyspnea (noticed over the last 6 months). HFpEF (Echo 1/10 with EF 55-60%), PAF (on Eliquis), chronic anemia, OSA (does not tolerate CPAP, wears 2LNC), mild restrictive lung disease (based on PFTs 2017) with question of small airway disease, GERD, diverticulosis, CKD stage IIIb, stress incontinence, former smoker.   She has had nonspecific chronic cough and congestion for several months and per patient this feels reminiscent of her symptoms in 2012 and 2017 during which she underwent more extensive pulmonary workup. Likely no acute process occurring at this time. CT Chest is pending.  Chronic cough OSA Mild restrictive lung disease vs. small airway disease CT chest with some lower lobe infiltrates viral versus aspiration Procalcitonin profile argues against infection Having some colored sputum.  BNP up and sputum is pinkish 06/15/2021 - ?  Acute on chronic diastolic dysfunction Plan - Continue empiric Unasyn for now - 3% saline nebulizer helps sputum expectoration - Sputum Gram stain and culture -Check ABG for any hypercapnia -Await respiratory virus panel and urine Streptococcus -Change Lasix 20 mg p.o. to 20 mg IV x1   Overall simple medical management and palliative approach only possible  Best Practice: (right click and "Reselect all SmartList Selections" daily)   Per Primary Team  We will follow-up  LABS    PULMONARY No results for input(s): PHART, PCO2ART, PO2ART, HCO3, TCO2, O2SAT in the last 168 hours.  Invalid input(s): PCO2, PO2  CBC Recent Labs  Lab 06/16/21 0644 06/17/21 0525 06/18/21 0438  HGB 8.3* 8.8*  8.5*  HCT 26.6* 28.8* 28.0*  WBC 5.2 8.5 8.7  PLT 403* 400 367    COAGULATION No results for input(s): INR in the last 168 hours.  CARDIAC  No results for input(s): TROPONINI in the last 168 hours. No results for input(s): PROBNP in the last 168 hours.   CHEMISTRY Recent Labs  Lab 06/15/21 1712 06/16/21 0644  NA 137 136   K 4.2 4.0  CL 93* 95*  CO2 33* 32  GLUCOSE 98 240*  BUN 52* 50*  CREATININE 2.00* 1.59*  CALCIUM 8.7* 8.7*   Estimated Creatinine Clearance: 28.8 mL/min (A) (by C-G formula based on SCr of 1.59 mg/dL (H)).   LIVER No results for input(s): AST, ALT, ALKPHOS, BILITOT, PROT, ALBUMIN, INR in the last 168 hours.   INFECTIOUS Recent Labs  Lab 06/18/21 1842 06/19/21 0534  PROCALCITON <0.10 <0.10     ENDOCRINE CBG (last 3)  Recent Labs    06/16/21 1751  GLUCAP 127*         IMAGING x48h  - image(s) personally visualized  -   highlighted in bold CT CHEST WO CONTRAST  Result Date: 06/18/2021 CLINICAL DATA:  Chronic dyspnea EXAM: CT CHEST WITHOUT CONTRAST TECHNIQUE: Multidetector CT imaging of the chest was performed following the standard protocol without IV contrast. RADIATION DOSE REDUCTION: This exam was performed according to the departmental dose-optimization program which includes automated exposure control, adjustment of the mA and/or kV according to patient size and/or use of iterative reconstruction technique. COMPARISON:  CT chest dated March 17, 2016. Chest radiograph dated June 18, 2021. FINDINGS: Cardiovascular: The heart is mildly enlarged. Scattered coronary artery atherosclerotic calcifications. No pericardial effusion. Mitral annular calcifications. Aorta is normal in caliber with atherosclerotic calcifications. Main pulmonary trunk is within upper normal limits. Mediastinum/Nodes: No enlarged mediastinal or axillary lymph nodes. Thyroid gland, trachea, and esophagus demonstrate no significant findings. Lungs/Pleura: Patchy and ground-glass opacities in the right upper and lower lobes. Right basilar atelectasis. Left lung is clear. Chronic scarring/atelectasis of the right upper lobe. Upper Abdomen: Increased density of the liver. Moderate fatty infiltration of the pancreas. No acute abnormality. Musculoskeletal: Multilevel degenerate disc disease of the cervical  spine with mild kyphosis. IMPRESSION: 1. Patchy and ground-glass opacities in the posterior aspect right upper and lower lobes concerning for multifocal pneumonia. Follow-up examination to resolution is recommended. 2.  Heart is enlarged.  No pericardial effusion. 3. Increase in AP diameter of the chest, likely secondary to chronic obstructive lung disease. 4. Degenerate disc disease of the thoracic spine. No acute osseous abnormality. 5.  Moderate fatty infiltration of the pancreas. 6. Increased density of the liver, correlate with history of amiodarone intake. Aortic Atherosclerosis (ICD10-I70.0). Electronically Signed   By: Keane Police D.O.   On: 06/18/2021 16:04

## 2021-06-19 NOTE — Progress Notes (Signed)
PROGRESS NOTE  [name] ZOX:[id number] DOB: [birth year] DOA: 06/15/2021 PCP: [name], MD  HPI/Recap of past 24 hours: Lisa Mcclure is a 88 y.o. female with medical history significant for HFpEF (EF 55-60% 04/20/2021), paroxysmal atrial fibrillation on Eliquis, CKD stage IIIb, HTN, hypothyroidism, small airway disease, anemia, venous insufficiency, AAA s/p endovascular stent graft (12/2019), and OSA intolerant to CPAP using supplemental O2 via [place] 2L qhs who presented to the ED for evaluation of worsening exertional dyspnea, worsening chronic congestion, fatigue and generalized weakness.  She has been less mobile than usual due to issues with her chronic osteoarthritis.  Work-up revealed symptomatic anemia with hemoglobin of 7.4, severe iron deficiency, she is post 1 unit PRBCs transfusion on 06/16/2021.  Seen by GI, post EGD on 06/18/2021.  It revealed: non-bleeding gastric ulcer with a clean ulcer base (Forrest Class III). Biopsied.  Erythematous mucosa in the antrum. Biopsied.  Normal examined duodenum. On 06/18/2021 she had a CT chest due to persistent chest congestion.  States she has had the same chest congestion for many weeks.  CT chest revealed concern for pneumonia, lower lobe infiltrates viral versus aspiration.  Acute respiratory virus panel and urine antigen Streptococcus are pending.  Appreciate pulmonary's assistance.  06/19/2021: Patient was seen and examined at bedside.  Feels congested with a productive cough.  Sputum cultures obtained.  Hypersaline nebs twice daily added.  Assessment/Plan: Principal Problem:   Acute respiratory failure with hypoxia (HCC) Active Problems:   Symptomatic anemia   Acute renal failure superimposed on stage 3b chronic kidney disease (HCC)   Chronic heart failure with preserved ejection fraction (HFpEF) (HCC)   Paroxysmal atrial fibrillation (HCC)   OSA (obstructive sleep apnea)   Hypertension   Hypothyroidism   RLS (restless legs  syndrome)   Generalized weakness  Acute respiratory failure with hypoxia (HCC) Chronic cough OSA Mild restrictive lung disease versus small airway disease Prior to admission with exertional dyspnea with SPO2 as low as 86% while on 2 L O2 via [place].  Endorses chest congestion for weeks. CXR without evidence of pneumonia or pulmonary edema.  Due to unrevealing chest x-ray, a CT chest was done. CT chest done on 06/18/2021 concerning for pneumonia viral versus aspiration. Continue Unasyn, pulmonary toilet Follow ABG, acute respiratory viral panel, Streptococcus urine antigen. Pulmonary consulted, appreciate pulmonary's assistance.   Symptomatic anemia, suspect secondary to chronic GI losses from nonbleeding gastric ulcer seen on EGD done on 06/18/2021. Hemoglobin stable 8.5 No overt bleeding Received 1 unit PRBC for hemoglobin of 7.4. Off heparin drip, resume home Eliquis on 06/19/2021. Continue ferrous sulfate daily Continue to monitor H&H  Non-bleeding gastric ulcer with a clean ulcer base (Forrest Class III). Biopsied. - Erythematous mucosa in the antrum. Biopsied. Seen on EGD done on 06/18/2021 by [facility] Continue p.o. Protonix twice daily  Severe iron deficiency, likely secondary to gastric ulcer Iron studies done on 06/15/2021 IV Feraheme x1 dose on 06/16/21 Continue oral ferrous sulfate daily.  Essential hypertension BP at goal, normotensive 118/61. Hold off home Norvasc to avoid hypotension. Currently on Coreg 6.25 mg twice daily, losartan 100 mg daily, 20 mg p.o. Lasix daily, amiodarone 200 mg p.o. daily. Maintain MAP >65 Continue to closely monitor vital signs.  Paroxysmal atrial fibrillation (HCC) Stable with controlled rate and rhythm on admission. -Continue amiodarone, Coreg Continue home Eliquis.   Acute renal failure superimposed on stage 3b chronic kidney disease (HCC) Creatinine 2.00 on admission.  Baseline labs show variable creatinine, usually between  1.2-1.5.   AKI  resolved, back to baseline creatinine 1.5 with GFR 31. IV fluid DC'd on 06/16/21 to avoid volume overload with elevated BNP greater than 300.  Chronic heart failure with preserved ejection fraction (HFpEF) ([place]) Last EF 55-60% by TTE 04/20/2021.  BNP greater than 300 Monitor volume status Continue strict I's and O's and daily weight    Generalized weakness Secondary to symptomatic anemia and chronic osteoarthritis with deconditioning. PT OT consulted and recommended home health PT OT. Continue to mobilize as tolerated with fall precautions   RLS (restless legs syndrome) Continue home Requip.   Hypothyroidism Continue home Synthroid.  Hyperlipidemia Continue home regimen.   OSA (obstructive sleep apnea) History of OSA intolerant to CPAP.  Has not been wearing oxygen at night regularly. -Continue supplemental oxygen nightly and as needed during the day Management per pulmonary, she will need to follow-up with pulmonary outpatient.     DVT prophylaxis: Eliquis. Code Status: DNR, confirmed with patient on admission. Family Communication: None at bedside.    Disposition Plan: From home, likely home with home health PT OT once GI signs off.  Consults called: GI, cardiology, pulmonary.  Severity of Illness:   Inpatient status.  Patient requires at least 2 midnights for further evaluation and treatment of present condition.  Objective: Vitals:   06/19/21 0445 06/19/21 0449 06/19/21 0802 06/19/21 1312  BP: (!) 120/47   118/61  Pulse: (!) 56   62  Resp: 16   18  Temp: 98.2 F (36.8 C)   98.2 F (36.8 C)  TempSrc: Oral   Oral  SpO2: 96%  94% 100%  Weight:  101.2 kg    Height:        Intake/Output Summary (Last 24 hours) at 06/19/2021 1605 Last data filed at 06/19/2021 1600 Gross per 24 hour  Intake 683.98 ml  Output 1000 ml  Net -316.02 ml   Filed Weights   06/18/21 0500 06/18/21 0714 06/19/21 0449  Weight: 100.8 kg 100.8 kg 101.2 kg    Exam:  General: 88 y.o.  year-old female frail-appearing in no acute distress.  She is alert and oriented x3.   Cardiovascular: Regular rate and rhythm no rubs or gallops. Respiratory: Diffuse wheezing bilaterally.  Mild rales at bases.  Good inspiratory effort. Abdomen: Soft nontender  normal bowel sounds present.   Musculoskeletal: Trace lower extremity edema bilaterally. Skin: No ulcerative lesions noted. Psychiatry: Mood is appropriate for condition and setting. Neuro: Nonfocal exam.   Data Reviewed: CBC: Recent Labs  Lab 06/15/21 1712 06/16/21 0644 06/17/21 0525 06/18/21 0438  WBC 7.3 5.2 8.5 8.7  NEUTROABS 5.3  --   --   --   HGB 7.4* 8.3* 8.8* 8.5*  HCT 24.8* 26.6* 28.8* 28.0*  MCV 88.6 86.1 87.3 89.5  PLT 392 403* 400 858   Basic Metabolic Panel: Recent Labs  Lab 06/15/21 1712 06/16/21 0644  NA 137 136  K 4.2 4.0  CL 93* 95*  CO2 33* 32  GLUCOSE 98 240*  BUN 52* 50*  CREATININE 2.00* 1.59*  CALCIUM 8.7* 8.7*   GFR: Estimated Creatinine Clearance: 28.8 mL/min (A) (by C-G formula based on SCr of 1.59 mg/dL (H)). Liver Function Tests: No results for input(s): AST, ALT, ALKPHOS, BILITOT, PROT, ALBUMIN in the last 168 hours. No results for input(s): LIPASE, AMYLASE in the last 168 hours. No results for input(s): AMMONIA in the last 168 hours. Coagulation Profile: No results for input(s): INR, PROTIME in the last 168 hours. Cardiac Enzymes:  No results for input(s): CKTOTAL, CKMB, CKMBINDEX, TROPONINI in the last 168 hours. BNP (last 3 results) No results for input(s): PROBNP in the last 8760 hours. HbA1C: No results for input(s): HGBA1C in the last 72 hours. CBG: Recent Labs  Lab 06/16/21 1751  GLUCAP 127*   Lipid Profile: No results for input(s): CHOL, HDL, LDLCALC, TRIG, CHOLHDL, LDLDIRECT in the last 72 hours. Thyroid Function Tests: No results for input(s): TSH, T4TOTAL, FREET4, T3FREE, THYROIDAB in the last 72 hours. Anemia Panel: No results for input(s): VITAMINB12,  FOLATE, FERRITIN, TIBC, IRON, RETICCTPCT in the last 72 hours.  Urine analysis:    Component Value Date/Time   COLORURINE STRAW (A) 09/13/2018 1740   APPEARANCEUR CLEAR 09/13/2018 1740   LABSPEC 1.008 09/13/2018 1740   PHURINE 7.0 09/13/2018 1740   GLUCOSEU NEGATIVE 09/13/2018 1740   HGBUR NEGATIVE 09/13/2018 1740   BILIRUBINUR NEGATIVE 09/13/2018 1740   KETONESUR NEGATIVE 09/13/2018 1740   PROTEINUR NEGATIVE 09/13/2018 1740   UROBILINOGEN 0.2 01/06/2009 0808   NITRITE NEGATIVE 09/13/2018 1740   LEUKOCYTESUR NEGATIVE 09/13/2018 1740   Sepsis Labs: '@LABRCNTIP'$ (procalcitonin:4,lacticidven:4)  ) Recent Results (from the past 240 hour(s))  Resp Panel by RT-PCR (Flu A&B, Covid) Nasopharyngeal Swab     Status: None   Collection Time: 06/15/21  5:16 PM   Specimen: Nasopharyngeal Swab; Nasopharyngeal(NP) swabs in vial transport medium  Result Value Ref Range Status   SARS Coronavirus 2 by RT PCR NEGATIVE NEGATIVE Final    Comment: (NOTE) SARS-CoV-2 target nucleic acids are NOT DETECTED.  The SARS-CoV-2 RNA is generally detectable in upper respiratory specimens during the acute phase of infection. The lowest concentration of SARS-CoV-2 viral copies this assay can detect is 138 copies/mL. A negative result does not preclude SARS-Cov-2 infection and should not be used as the sole basis for treatment or other patient management decisions. A negative result may occur with  improper specimen collection/handling, submission of specimen other than nasopharyngeal swab, presence of viral mutation(s) within the areas targeted by this assay, and inadequate number of viral copies(<138 copies/mL). A negative result must be combined with clinical observations, patient history, and epidemiological information. The expected result is Negative.  Fact Sheet for Patients:  EntrepreneurPulse.com.au  Fact Sheet for Healthcare Providers:   IncredibleEmployment.be  This test is no t yet approved or cleared by the [place] FDA and  has been authorized for detection and/or diagnosis of SARS-CoV-2 by FDA under an Emergency Use Authorization (EUA). This EUA will remain  in effect (meaning this test can be used) for the duration of the COVID-19 declaration under Section 564(b)(1) of the Act, 21 U.S.C.section 360bbb-3(b)(1), unless the authorization is terminated  or revoked sooner.       Influenza A by PCR NEGATIVE NEGATIVE Final   Influenza B by PCR NEGATIVE NEGATIVE Final    Comment: (NOTE) The Xpert Xpress SARS-CoV-2/FLU/RSV plus assay is intended as an aid in the diagnosis of influenza from Nasopharyngeal swab specimens and should not be used as a sole basis for treatment. Nasal washings and aspirates are unacceptable for Xpert Xpress SARS-CoV-2/FLU/RSV testing.  Fact Sheet for Patients: EntrepreneurPulse.com.au  Fact Sheet for Healthcare Providers: IncredibleEmployment.be  This test is not yet approved or cleared by the [place] FDA and has been authorized for detection and/or diagnosis of SARS-CoV-2 by FDA under an Emergency Use Authorization (EUA). This EUA will remain in effect (meaning this test can be used) for the duration of the COVID-19 declaration under Section 564(b)(1) of the Act, 21 U.S.C.  section 360bbb-3(b)(1), unless the authorization is terminated or revoked.  Performed at [facility], [place] [address], [place], [place] [address]   Expectorated Sputum Assessment w Gram Stain, Rflx to Resp Cult     Status: None   Collection Time: 06/19/21  9:44 AM   Specimen: Expectorated Sputum  Result Value Ref Range Status   Specimen Description EXPECTORATED SPUTUM  Final   Special Requests NONE  Final   Sputum evaluation   Final    Sputum specimen not acceptable for testing.  Please recollect.   Performed at [facility], [place] [address] [place],  [address]    Report Status 06/19/2021 FINAL  Final      Studies: No results found.  Scheduled Meds:  amiodarone  200 mg Oral Daily   apixaban  2.5 mg Oral BID   carvedilol  6.25 mg Oral BID WC   cholecalciferol  5,000 Units Oral Daily   ezetimibe  10 mg Oral Daily   ferrous sulfate  325 mg Oral Q breakfast   ferrous sulfate  325 mg Oral Q breakfast   guaiFENesin  1,200 mg Oral BID   ipratropium-albuterol  3 mL Nebulization TID   latanoprost  1 drop Both Eyes QHS   levothyroxine  125 mcg Oral QAC breakfast   losartan  100 mg Oral Q1200   multivitamin with minerals  1 tablet Oral Daily   pantoprazole  40 mg Oral BID   sodium chloride flush  3 mL Intravenous Q12H   sodium chloride HYPERTONIC  4 mL Nebulization BID    Continuous Infusions:  ampicillin-sulbactam (UNASYN) IV 3 g (06/19/21 0839)     LOS: 3 days     Kayleen Memos, MD [facility] Pager [phone]  If 7PM-7AM, please contact night-coverage [email or web address] [employer] 06/19/2021, 4:05 PM

## 2021-06-19 NOTE — Progress Notes (Signed)
Notified Lab that ABG being sent for analysis. 

## 2021-06-19 NOTE — Evaluation (Signed)
Clinical/Bedside Swallow Evaluation ?Patient Details  ?Name: Lisa Mcclure ?MRN: [id number] ?Date of Birth: [birth year] ? ?Today's Date: 06/19/2021 ?Time: SLP Start Time (ACUTE ONLY): 1440 SLP Stop Time (ACUTE ONLY): 2458 ?SLP Time Calculation (min) (ACUTE ONLY): 35 min ? ?Past Medical History:  ?Past Medical History:  ?Diagnosis Date  ? Anemia   ? h/o hemorrhoidal bleeding and blood transfusion  ? Cardiomegaly   ? Chronic diastolic CHF (congestive heart failure) ([place])   ? Depression   ? Diverticulosis   ? DOE (dyspnea on exertion)   ? Esophageal reflux   ? GERD (gastroesophageal reflux disease)   ? Hypothyroidism   ? Insomnia   ? LBP (low back pain)   ? OAB (overactive bladder)   ? Obesity   ? OSA (obstructive sleep apnea)   ? Osteoarthritis   ? Paroxysmal atrial fibrillation (HCC)   ? Rheumatoid arthritis(714.0)   ? Shoulder pain, bilateral   ? Unspecified essential hypertension   ? Venous insufficiency   ? ?Past Surgical History:  ?Past Surgical History:  ?Procedure Laterality Date  ? ABDOMINAL AORTIC ENDOVASCULAR STENT GRAFT N/A 12/20/2019  ? Procedure: Aortogram including catheter selection of aorta and bilateral iliac arteriogram, Endovascular repair of infrarenal abdominal aortic aneurysm with bifurcated stent graft (26 mm x 14 x 12 main body, right bell bottom with a 20 mm x 10 cm piece, and left bell bottom with a 16 mm x 12 cm piece) ;  Surgeon: Marty Heck, MD;  Location: [facility];  Service: Vascular;  Laterality: N/A;  ? APPENDECTOMY  1953  ? bladder abduction-1996  1996  ? breast biopsy Right 1980  ? CARDIOVERSION N/A 12/24/2019  ? Procedure: CARDIOVERSION;  Surgeon: Adrian Prows, MD;  Location: [facility] ENDOSCOPY;  Service: Cardiovascular;  Laterality: N/A;  ? [place]    ? 1957, 1961, 1964  ? COSMETIC SURGERY  1996  ? CYSTOCELE REPAIR    ? FLEXIBLE SIGMOIDOSCOPY N/A 04/10/2015  ? Procedure: FLEXIBLE SIGMOIDOSCOPY;  Surgeon: Arta Silence, MD;  Location: [facility] ENDOSCOPY;  Service: Endoscopy;   Laterality: N/A;  ? knee arthroscopy Right 1996, 2010  ? KNEE ARTHROSCOPY W/ AUTOGENOUS CARTILAGE IMPLANTATION (ACI) PROCEDURE Left 1994, 1995  ? REFRACTIVE SURGERY  01/2020  ? SHOULDER SURGERY  1990  ? TEE WITHOUT CARDIOVERSION N/A 12/24/2019  ? Procedure: TRANSESOPHAGEAL ECHOCARDIOGRAM (TEE);  Surgeon: Adrian Prows, MD;  Location: [facility];  Service: Cardiovascular;  Laterality: N/A;  ? [place]  ? ULTRASOUND GUIDANCE FOR VASCULAR ACCESS Bilateral 12/20/2019  ? Procedure: Ultrasound-guided access of bilateral common femoral arteries for delivery of endograft and percutaneous closure;  Surgeon: Marty Heck, MD;  Location: [facility] OR;  Service: Vascular;  Laterality: Bilateral;  ? VESICOVAGINAL FISTULA CLOSURE W/ TAH    ? WRIST SURGERY  1967  ? ?HPI:  ?88 year old woman who presented to [facility] ED with worsening dyspnea (noticed over the last 6 months). HFpEF (Echo 1/10 with EF 55-60%), PAF (on Eliquis), chronic anemia, OSA (does not tolerate CPAP, wears 2LNC), mild restrictive lung disease (based on PFTs 2017) with question of small airway disease, GERD, diverticulosis, CKD stage IIIb, stress incontinence, former smoker. She has had nonspecific chronic cough and congestion for several months and per patient this feels reminiscent of her symptoms in 2012 and 2017 during which she underwent more extensive pulmonary workup. CT chest with scattered lower lobe infiltrates consistent with viral and may be aspiration and also fatty infiltration consistent with amiodarone intake..  ?  ?Assessment / Plan /  Recommendation  ?Clinical Impression ? Patient presents with a normal appearing oropharyngeal swallow without overt s/s of aspiration however chest CT suggesting aspiration and daughter/patient reports of coughing with pos raise concern. In light of ongoing respiratory issues, recommend instrumental testing to further evaluate swallowing physiology and ensure that least restrictive diet is  recommended. ?SLP Visit Diagnosis: Dysphagia, unspecified (R13.10) ?   ?Aspiration Risk ?    ?  ?Diet Recommendation Regular;Thin liquid  ? ?Liquid Administration via: Cup;Straw ?Medication Administration: Whole meds with liquid ?Supervision: Patient able to self feed ?Compensations: Slow rate;Small sips/bites ?Postural Changes: Seated upright at 90 degrees  ?  ?Other  Recommendations Oral Care Recommendations: Oral care BID   ? ?Recommendations for follow up therapy are one component of a multi-disciplinary discharge planning process, led by the attending physician.  Recommendations may be updated based on patient status, additional functional criteria and insurance authorization. ? ?Follow up Recommendations Other (comment) (TBD)  ? ? ?  ? ? ?Swallow Study   ?General HPI: 88 year old woman who presented to [facility] ED with worsening dyspnea (noticed over the last 6 months). HFpEF (Echo 1/10 with EF 55-60%), PAF (on Eliquis), chronic anemia, OSA (does not tolerate CPAP, wears 2LNC), mild restrictive lung disease (based on PFTs 2017) with question of small airway disease, GERD, diverticulosis, CKD stage IIIb, stress incontinence, former smoker. She has had nonspecific chronic cough and congestion for several months and per patient this feels reminiscent of her symptoms in 2012 and 2017 during which she underwent more extensive pulmonary workup. CT chest with scattered lower lobe infiltrates consistent with viral and may be aspiration and also fatty infiltration consistent with amiodarone intake.Marland Kitchen ?Type of Study: Bedside Swallow Evaluation ?Previous Swallow Assessment: none ?Diet Prior to this Study: Regular;Thin liquids ?Temperature Spikes Noted: No ?Respiratory Status: Nasal cannula ?History of Recent Intubation: No ?Behavior/Cognition: Alert;Cooperative;Pleasant mood ?Oral Cavity Assessment: Within Functional Limits ?Oral Care Completed by SLP: No ?Oral Cavity - Dentition: Adequate natural dentition ?Vision: Functional  for self-feeding ?Self-Feeding Abilities: Able to feed self ?Patient Positioning: Upright in bed ?Baseline Vocal Quality: Normal ?Volitional Cough: Strong ?Volitional Swallow: Able to elicit  ?  ?Oral/Motor/Sensory Function Overall Oral Motor/Sensory Function: Within functional limits   ?Ice Chips Ice chips: Not tested   ?Thin Liquid Thin Liquid: Within functional limits ?Presentation: Straw;Cup;Self Fed  ?  ?Nectar Thick Nectar Thick Liquid: Not tested   ?Honey Thick Honey Thick Liquid: Not tested   ?Puree Puree: Within functional limits ?Presentation: Self Fed;Spoon   ?Solid ? ? ?  Solid: Within functional limits ?Presentation: Self Fed  ? ?  ?Monaye Blackie MA, CCC-SLP ? ?Yacob Wilkerson Meryl ?06/19/2021,3:33 PM ? ? ? ?

## 2021-06-19 NOTE — Progress Notes (Signed)
PT demonstrated hands on understanding of Flutter device- NPC at this time. 

## 2021-06-19 NOTE — Plan of Care (Signed)
  Problem: Education: Goal: Knowledge of General Education information will improve Description Including pain rating scale, medication(s)/side effects and non-pharmacologic comfort measures Outcome: Progressing   

## 2021-06-20 DIAGNOSIS — J9601 Acute respiratory failure with hypoxia: Secondary | ICD-10-CM | POA: Diagnosis not present

## 2021-06-20 LAB — BASIC METABOLIC PANEL
Anion gap: 8 (ref 5–15)
BUN: 36 mg/dL — ABNORMAL HIGH (ref 8–23)
CO2: 37 mmol/L — ABNORMAL HIGH (ref 22–32)
Calcium: 8.5 mg/dL — ABNORMAL LOW (ref 8.9–10.3)
Chloride: 93 mmol/L — ABNORMAL LOW (ref 98–111)
Creatinine, Ser: 1.28 mg/dL — ABNORMAL HIGH (ref 0.44–1.00)
GFR, Estimated: 41 mL/min — ABNORMAL LOW (ref >60)
Glucose, Bld: 100 mg/dL — ABNORMAL HIGH (ref 70–99)
Potassium: 4.3 mmol/L (ref 3.5–5.1)
Sodium: 138 mmol/L (ref 135–145)

## 2021-06-20 LAB — CBC
HCT: 29.6 % — ABNORMAL LOW (ref 36.0–46.0)
Hemoglobin: 8.7 g/dL — ABNORMAL LOW (ref 12.0–15.0)
MCH: 26.9 pg (ref 26.0–34.0)
MCHC: 29.4 g/dL — ABNORMAL LOW (ref 30.0–36.0)
MCV: 91.4 fL (ref 80.0–100.0)
Platelets: 377 10*3/uL (ref 150–400)
RBC: 3.24 MIL/uL — ABNORMAL LOW (ref 3.87–5.11)
RDW: 17.4 % — ABNORMAL HIGH (ref 11.5–15.5)
WBC: 7.4 10*3/uL (ref 4.0–10.5)
nRBC: 0.3 % — ABNORMAL HIGH (ref 0.0–0.2)

## 2021-06-20 LAB — PHOSPHORUS: Phosphorus: 4.2 mg/dL (ref 2.5–4.6)

## 2021-06-20 LAB — EXPECTORATED SPUTUM ASSESSMENT W GRAM STAIN, RFLX TO RESP C

## 2021-06-20 LAB — MAGNESIUM: Magnesium: 2.2 mg/dL (ref 1.7–2.4)

## 2021-06-20 LAB — PROCALCITONIN: Procalcitonin: <0.1 ng/mL

## 2021-06-20 MED ORDER — SODIUM CHLORIDE 0.9 % IV SOLN
3.0000 g | Freq: Four times a day (QID) | INTRAVENOUS | Status: DC
  Administered 2021-06-20 – 2021-06-21 (×4): 3 g via INTRAVENOUS
  Filled 2021-06-20 (×5): qty 8

## 2021-06-20 MED ORDER — FUROSEMIDE 10 MG/ML IJ SOLN
20.0000 mg | Freq: Once | INTRAMUSCULAR | Status: AC
  Administered 2021-06-20: 20 mg via INTRAVENOUS
  Filled 2021-06-20: qty 2

## 2021-06-20 MED ORDER — POTASSIUM CHLORIDE CRYS ER 20 MEQ PO TBCR
20.0000 meq | EXTENDED_RELEASE_TABLET | Freq: Once | ORAL | Status: AC
  Administered 2021-06-20: 20 meq via ORAL
  Filled 2021-06-20: qty 1

## 2021-06-20 NOTE — Plan of Care (Signed)
?  Problem: Education: ?Goal: Knowledge of General Education information will improve ?Description: Including pain rating scale, medication(s)/side effects and non-pharmacologic comfort measures ?Outcome: Progressing ?  ?Problem: Education: ?Goal: Knowledge of General Education information will improve ?Description: Including pain rating scale, medication(s)/side effects and non-pharmacologic comfort measures ?Outcome: Progressing ?  ?Problem: Clinical Measurements: ?Goal: Respiratory complications will improve ?Outcome: Progressing ?  ?Problem: Activity: ?Goal: Risk for activity intolerance will decrease ?Outcome: Progressing ?  ?Problem: Nutrition: ?Goal: Adequate nutrition will be maintained ?Outcome: Progressing ?  ?

## 2021-06-20 NOTE — Progress Notes (Signed)
PT continues to demonstrate hands on understanding of Flutter device. PC per PT. ?

## 2021-06-20 NOTE — Progress Notes (Signed)
PROGRESS NOTE  Lisa Mcclure QBH:[id number] DOB: 07-27-1932 DOA: 06/15/2021 PCP: [name], MD  HPI/Recap of past 24 hours: Lisa Mcclure is a 88 y.o. female with medical history significant for HFpEF (EF 55-60% 04/20/2021), paroxysmal atrial fibrillation on Eliquis, CKD stage IIIb, HTN, hypothyroidism, small airway disease, anemia, venous insufficiency, AAA s/p endovascular stent graft (12/2019), and OSA intolerant to CPAP using supplemental O2 via [place] 2L qhs who presented to the ED for evaluation of worsening exertional dyspnea, worsening chronic congestion, fatigue and generalized weakness.  She has been less mobile than usual due to issues with her chronic osteoarthritis.  Work-up revealed symptomatic anemia with hemoglobin of 7.4, severe iron deficiency, she is post 1 unit PRBCs transfusion on 06/16/2021.  Seen by GI, post EGD on 06/18/2021.  It revealed: non-bleeding gastric ulcer with a clean ulcer base (Forrest Class III). Biopsied.  Erythematous mucosa in the antrum. Biopsied.  Normal examined duodenum. On 06/18/2021 she had a CT chest due to persistent chest congestion.  States she has had the same chest congestion for many weeks.  CT chest revealed concern for pneumonia, lower lobe infiltrates viral versus aspiration.  Acute respiratory virus panel and urine antigen Streptococcus are pending.  Appreciate pulmonary's assistance.  06/19/2021: Patient was seen and examined at bedside.  Feels congested with a productive cough.  Sputum cultures obtained.  Hypersaline nebs twice daily added.  Assessment/Plan: Principal Problem:   Acute respiratory failure with hypoxia (HCC) Active Problems:   Symptomatic anemia   Acute renal failure superimposed on stage 3b chronic kidney disease (HCC)   Chronic heart failure with preserved ejection fraction (HFpEF) (HCC)   Paroxysmal atrial fibrillation (HCC)   OSA (obstructive sleep apnea)   Hypertension   Hypothyroidism   RLS (restless legs  syndrome)   Generalized weakness  Acute respiratory failure with hypoxia and hypercarbia (HCC) Chronic cough OSA Mild restrictive lung disease versus small airway disease Prior to admission with exertional dyspnea with SPO2 as low as 86% while on 2 L O2 via [place].  Endorses chest congestion for weeks. CXR without evidence of pneumonia or pulmonary edema.  Due to unrevealing chest x-ray, a CT chest was done. CT chest done on 06/18/2021 concerning for pneumonia viral versus aspiration. Continue Unasyn, pulmonary toilet ABG completed on 06/19/2021 7.41/62/82 Sputum culture growing few gram-positive cocci and few gram-positive cocci in chains, will continue to follow for ID and sensitivities in order to narrow down IV antibiotics Patient endorses noncompliance with her home CPAP as it is uncomfortable to wear.   Symptomatic anemia, suspect secondary to chronic GI losses from nonbleeding gastric ulcer seen on EGD done on 06/18/2021. Hemoglobin stable 8.5 No overt bleeding Received 1 unit PRBC for hemoglobin of 7.4. Off heparin drip, resume home Eliquis on 06/19/2021. Continue ferrous sulfate daily Continue to monitor H&H  AKI on CKD 3B She appears to be at her baseline creatinine 1.28 with GFR 41. Continue to avoid nephrotoxic agents and hypotension. Continue to monitor urine output  Non-bleeding gastric ulcer with a clean ulcer base (Forrest Class III). Biopsied. - Erythematous mucosa in the antrum. Biopsied. Seen on EGD done on 06/18/2021 by [facility] Continue p.o. Protonix twice daily  Severe iron deficiency, likely secondary to gastric ulcer Iron studies done on 06/15/2021 IV Feraheme x1 dose on 06/16/21 Continue oral ferrous sulfate daily.  Essential hypertension BP at goal, normotensive 118/61. Hold off home Norvasc to avoid hypotension. Currently on Coreg 6.25 mg twice daily, losartan 100 mg daily, 20  mg p.o. Lasix daily, amiodarone 200 mg p.o. daily. Maintain MAP >65 Continue to  closely monitor vital signs.  Paroxysmal atrial fibrillation (HCC) Stable with controlled rate and rhythm on admission. -Continue amiodarone, Coreg Continue home Eliquis.   Acute renal failure superimposed on stage 3b chronic kidney disease (HCC) Creatinine 2.00 on admission.  Baseline labs show variable creatinine, usually between 1.2-1.5.   AKI resolved, back to baseline creatinine 1.5 with GFR 31. IV fluid DC'd on 06/16/21 to avoid volume overload with elevated BNP greater than 300.  Chronic heart failure with preserved ejection fraction (HFpEF) ([place]) Last EF 55-60% by TTE 04/20/2021.  BNP greater than 300 Monitor volume status Continue strict I's and O's and daily weight    Generalized weakness Secondary to symptomatic anemia and chronic osteoarthritis with deconditioning. PT OT consulted and recommended home health PT OT. Continue to mobilize as tolerated with fall precautions   RLS (restless legs syndrome) Continue home Requip.   Hypothyroidism Continue home Synthroid.  Hyperlipidemia Continue home regimen.   OSA (obstructive sleep apnea) with CO2 retention History of OSA intolerant to CPAP.  Has not been wearing oxygen at night regularly. -Continue supplemental oxygen nightly and as needed during the day Management per pulmonary, she will need to follow-up with pulmonary outpatient.  PCO2 62 with pH of 7.41 on 06/19/2021    DVT prophylaxis: Eliquis. Code Status: DNR, confirmed with patient on admission. Family Communication: None at bedside.    Disposition Plan: We will discharge to home once pulmonary signs off.  Consults called: GI, cardiology, pulmonary.  Severity of Illness:   Inpatient status.  Patient requires at least 2 midnights for further evaluation and treatment of present condition.  Objective: Vitals:   06/20/21 0449 06/20/21 0500 06/20/21 0822 06/20/21 1221  BP: (!) 132/53   111/62  Pulse: (!) 58   (!) 51  Resp: 20   18  Temp: 98.2 F (36.8  C)   98 F (36.7 C)  TempSrc: Oral   Oral  SpO2: 97%  96% 97%  Weight:  97.8 kg    Height:        Intake/Output Summary (Last 24 hours) at 06/20/2021 1253 Last data filed at 06/20/2021 1030 Gross per 24 hour  Intake 1640.98 ml  Output 3450 ml  Net -1809.02 ml   Filed Weights   06/18/21 0714 06/19/21 0449 06/20/21 0500  Weight: 100.8 kg 101.2 kg 97.8 kg    Exam:  General: 88 y.o. year-old female frail-appearing in no acute stress.  She is alert and oriented x3.   Cardiovascular: Regular rate and rhythm no rubs or gallops.   Respiratory: Mild diffuse wheezing bilaterally.  Good inspiratory effort.   Abdomen: Soft nontender normal bowel sounds present. Musculoskeletal: Trace lower extremity edema bilaterally. Skin: No ulcerative lesions noted. Psychiatry: Mood is appropriate for condition and setting. Neuro: Nonfocal exam.   Data Reviewed: CBC: Recent Labs  Lab 06/15/21 1712 06/16/21 0644 06/17/21 0525 06/18/21 0438 06/20/21 0524  WBC 7.3 5.2 8.5 8.7 7.4  NEUTROABS 5.3  --   --   --   --   HGB 7.4* 8.3* 8.8* 8.5* 8.7*  HCT 24.8* 26.6* 28.8* 28.0* 29.6*  MCV 88.6 86.1 87.3 89.5 91.4  PLT 392 403* 400 367 174   Basic Metabolic Panel: Recent Labs  Lab 06/15/21 1712 06/16/21 0644 06/20/21 0524 06/20/21 0536  NA 137 136  --  138  K 4.2 4.0  --  4.3  CL 93* 95*  --  93*  CO2 33* 32  --  37*  GLUCOSE 98 240*  --  100*  BUN 52* 50*  --  36*  CREATININE 2.00* 1.59*  --  1.28*  CALCIUM 8.7* 8.7*  --  8.5*  MG  --   --  2.2  --   PHOS  --   --  4.2  --    GFR: Estimated Creatinine Clearance: 35.2 mL/min (A) (by C-G formula based on SCr of 1.28 mg/dL (H)). Liver Function Tests: No results for input(s): AST, ALT, ALKPHOS, BILITOT, PROT, ALBUMIN in the last 168 hours. No results for input(s): LIPASE, AMYLASE in the last 168 hours. No results for input(s): AMMONIA in the last 168 hours. Coagulation Profile: No results for input(s): INR, PROTIME in the last 168  hours. Cardiac Enzymes: No results for input(s): CKTOTAL, CKMB, CKMBINDEX, TROPONINI in the last 168 hours. BNP (last 3 results) No results for input(s): PROBNP in the last 8760 hours. HbA1C: No results for input(s): HGBA1C in the last 72 hours. CBG: Recent Labs  Lab 06/16/21 1751  GLUCAP 127*   Lipid Profile: No results for input(s): CHOL, HDL, LDLCALC, TRIG, CHOLHDL, LDLDIRECT in the last 72 hours. Thyroid Function Tests: No results for input(s): TSH, T4TOTAL, FREET4, T3FREE, THYROIDAB in the last 72 hours. Anemia Panel: No results for input(s): VITAMINB12, FOLATE, FERRITIN, TIBC, IRON, RETICCTPCT in the last 72 hours.  Urine analysis:    Component Value Date/Time   COLORURINE STRAW (A) 09/13/2018 1740   APPEARANCEUR CLEAR 09/13/2018 1740   LABSPEC 1.008 09/13/2018 1740   PHURINE 7.0 09/13/2018 1740   GLUCOSEU NEGATIVE 09/13/2018 1740   HGBUR NEGATIVE 09/13/2018 1740   BILIRUBINUR NEGATIVE 09/13/2018 1740   KETONESUR NEGATIVE 09/13/2018 1740   PROTEINUR NEGATIVE 09/13/2018 1740   UROBILINOGEN 0.2 01/06/2009 0808   NITRITE NEGATIVE 09/13/2018 1740   LEUKOCYTESUR NEGATIVE 09/13/2018 1740   Sepsis Labs: '@LABRCNTIP'$ (procalcitonin:4,lacticidven:4)  ) Recent Results (from the past 240 hour(s))  Resp Panel by RT-PCR (Flu A&B, Covid) Nasopharyngeal Swab     Status: None   Collection Time: 06/15/21  5:16 PM   Specimen: Nasopharyngeal Swab; Nasopharyngeal(NP) swabs in vial transport medium  Result Value Ref Range Status   SARS Coronavirus 2 by RT PCR NEGATIVE NEGATIVE Final    Comment: (NOTE) SARS-CoV-2 target nucleic acids are NOT DETECTED.  The SARS-CoV-2 RNA is generally detectable in upper respiratory specimens during the acute phase of infection. The lowest concentration of SARS-CoV-2 viral copies this assay can detect is 138 copies/mL. A negative result does not preclude SARS-Cov-2 infection and should not be used as the sole basis for treatment or other patient  management decisions. A negative result may occur with  improper specimen collection/handling, submission of specimen other than nasopharyngeal swab, presence of viral mutation(s) within the areas targeted by this assay, and inadequate number of viral copies(<138 copies/mL). A negative result must be combined with clinical observations, patient history, and epidemiological information. The expected result is Negative.  Fact Sheet for Patients:  EntrepreneurPulse.com.au  Fact Sheet for Healthcare Providers:  IncredibleEmployment.be  This test is no t yet approved or cleared by the [place] FDA and  has been authorized for detection and/or diagnosis of SARS-CoV-2 by FDA under an Emergency Use Authorization (EUA). This EUA will remain  in effect (meaning this test can be used) for the duration of the COVID-19 declaration under Section 564(b)(1) of the Act, 21 U.S.C.section 360bbb-3(b)(1), unless the authorization is terminated  or revoked sooner.  Influenza A by PCR NEGATIVE NEGATIVE Final   Influenza B by PCR NEGATIVE NEGATIVE Final    Comment: (NOTE) The Xpert Xpress SARS-CoV-2/FLU/RSV plus assay is intended as an aid in the diagnosis of influenza from Nasopharyngeal swab specimens and should not be used as a sole basis for treatment. Nasal washings and aspirates are unacceptable for Xpert Xpress SARS-CoV-2/FLU/RSV testing.  Fact Sheet for Patients: EntrepreneurPulse.com.au  Fact Sheet for Healthcare Providers: IncredibleEmployment.be  This test is not yet approved or cleared by the [place] FDA and has been authorized for detection and/or diagnosis of SARS-CoV-2 by FDA under an Emergency Use Authorization (EUA). This EUA will remain in effect (meaning this test can be used) for the duration of the COVID-19 declaration under Section 564(b)(1) of the Act, 21 U.S.C. section 360bbb-3(b)(1),  unless the authorization is terminated or revoked.  Performed at [facility], [place] [address], [place], [place] [address]   Expectorated Sputum Assessment w Gram Stain, Rflx to Resp Cult     Status: None   Collection Time: 06/19/21  9:44 AM   Specimen: Expectorated Sputum  Result Value Ref Range Status   Specimen Description EXPECTORATED SPUTUM  Final   Special Requests NONE  Final   Sputum evaluation   Final    Sputum specimen not acceptable for testing.  Please recollect.   Performed at [facility], [place] [address] [place], [place] [address]    Report Status 06/19/2021 FINAL  Final  Expectorated Sputum Assessment w Gram Stain, Rflx to Resp Cult     Status: None (Preliminary result)   Collection Time: 06/19/21  6:10 PM   Specimen: Expectorated Sputum  Result Value Ref Range Status   Specimen Description EXPECTORATED SPUTUM  Final   Special Requests NONE  Final   Sputum evaluation   Final    WBC SEEN FEW SQUAMOUS EPITHELIAL CELLS PRESENT RARE GRAM POSITIVE COCCI FEW GRAM POSITIVE COCCI IN CHAINS FEW THIS SPECIMEN IS ACCEPTABLE FOR SPUTUM CULTURE Performed at [facility], [place] [address], [place], [place] [address]    Report Status PENDING  Incomplete  Culture, Respiratory w Gram Stain     Status: None (Preliminary result)   Collection Time: 06/19/21  6:10 PM  Result Value Ref Range Status   Specimen Description   Final    EXPECTORATED SPUTUM Performed at [place] [address], [place], [place] [address]    Special Requests   Final    NONE Reflexed from [id number] Performed at [facility], [place] [address] [place], [place] [address]    Gram Stain   Final    FEW SQUAMOUS EPITHELIAL CELLS PRESENT FEW WBC SEEN FEW GRAM POSITIVE RODS FEW GRAM NEGATIVE RODS FEW GRAM POSITIVE COCCI Performed at [facility], [place] [address] [place], [place] [address]    Culture PENDING   Incomplete   Report Status PENDING  Incomplete      Studies: No results found.  Scheduled Meds:  amiodarone  200 mg Oral Daily   apixaban  2.5 mg Oral BID   carvedilol  6.25 mg Oral BID WC   cholecalciferol  5,000 Units Oral Daily   ezetimibe  10 mg Oral Daily   ferrous sulfate  325 mg Oral Q breakfast   ferrous sulfate  325 mg Oral Q breakfast   guaiFENesin  1,200 mg Oral BID   ipratropium-albuterol  3 mL Nebulization TID   latanoprost  1 drop Both Eyes QHS  levothyroxine  125 mcg Oral QAC breakfast   losartan  100 mg Oral Q1200   multivitamin with minerals  1 tablet Oral Daily   pantoprazole  40 mg Oral BID   sodium chloride flush  3 mL Intravenous Q12H   sodium chloride HYPERTONIC  4 mL Nebulization BID    Continuous Infusions:  ampicillin-sulbactam (UNASYN) IV 3 g (06/20/21 0803)     LOS: 4 days     Kayleen Memos, MD [facility] Pager [phone]  If 7PM-7AM, please contact night-coverage [email or web address] Password [facility] 06/20/2021, 12:53 PM

## 2021-06-20 NOTE — Progress Notes (Signed)
NAME:  Lisa Mcclure, MRN:  [id number], DOB:  May 08, 1932, LOS: 4 ADMISSION DATE:  06/15/2021 CONSULTATION DATE:  06/18/2021 REFERRING MD:  [place] Crane - [facility] CHIEF COMPLAINT:  Congestion, chronic cough, dyspnea  BRIEF  88 year old woman who presented to [facility] ED with worsening dyspnea. PMHx significant for HTN, HFpEF (Echo 1/10 with EF 55-60%), PAF (on Eliquis), chronic anemia, OSA (does not tolerate CPAP, wears 2LNC), mild restrictive lung disease (based on PFTs 2017) with question of small airway disease, GERD, diverticulosis, CKD stage IIIb, stress incontinence, former smoker.On chronic amio  Patient is seen up to chair at bedside.  Reports that she has had a persistent cough/upper airway congestion for approximately 6 months; despite interventions this has not resolved.  She had a similar chronic cough years ago for which she underwent extensive pulmonary work-up with Dr. Halford Chessman (2012)/Dr. Vaughan Browner (2017).  This eventually resolved after several interventions, but she feels antibiotics were the most significant of these interventions.  Currently, she reports persistent productive cough, she is unable to get sputum up describing it as "[place]mer's glue".  Reports that she has attempted to use an albuterol inhaler at home but struggles with this due to fine motor coordination.  It sounds as if she was provided with nebulizer vials but does not have a nebulizer at home.  She does utilize 2L [place] nightly for sleep apnea, as she is unable to tolerate her CPAP.  More recently she has required 2L  for activities requiring exertion such as making the bed, common ADLs. Denies fever/chills, recent sick contacts, CP, n/v/abdominal pain, dizziness or lightheadedness. Endorses SOB, DOE, LE edema (no worse than baseline).  PCCM consulted for assistance with management.    past   has a past medical history of Anemia, Cardiomegaly, Chronic diastolic CHF (congestive heart failure) ([place]), Depression, Diverticulosis, DOE  (dyspnea on exertion), Esophageal reflux, GERD (gastroesophageal reflux disease), Hypothyroidism, Insomnia, LBP (low back pain), OAB (overactive bladder), Obesity, OSA (obstructive sleep apnea), Osteoarthritis, Paroxysmal atrial fibrillation ([place]), Rheumatoid arthritis(714.0), Shoulder pain, bilateral, Unspecified essential hypertension, and Venous insufficiency.   has a past surgical history that includes Appendectomy (6861); Vesicovaginal fistula closure w/ TAH; breast biopsy (Right, 1980); Knee arthroscopy w/ autogenous cartilage implantation procedure (Left, 1994, 1995); knee arthroscopy (Right, 1996, 2010); Shoulder surgery (1990); Cystocele repair; Cesarean section; Wrist surgery (6837); Total abdominal hysterectomy (1972); bladder abduction-1996 (1996); Cosmetic surgery (1996); Flexible sigmoidoscopy (N/A, 04/10/2015); Abdominal aortic endovascular stent graft (N/A, 12/20/2019); Ultrasound guidance for vascular access (Bilateral, 12/20/2019); TEE without cardioversion (N/A, 12/24/2019); Cardioversion (N/A, 12/24/2019); and Refractive surgery (01/2020).    Significant Hospital Events: Including procedures, antibiotic start and stop dates in addition to other pertinent events   3.7 - Admitted for SOB, anemia 3/10 - PCCM consulted for chronic cough/chest congestion.  CT chest with scattered lower lobe infiltrates consistent with viral and may be aspiration and also fatty infiltration consistent with amiodarone intake..  Started on Unasyn empiric Upper endoscopy dR [place] One non-bleeding superficial gastric ulcer with a clean ulcer base (Forrest Class III) was found in the gastric body. The lesion was 8 mm in largest dimension. Biopsies were taken with a cold forceps for histology = 3/11-continues on 2 L nasal cannula with pulse ox 94-100%.  Getting Unasyn but continues to remain afebrile.  White count normal.  Started on Unasyn yesterday.  Troponin normal.  BNP elevated at 519.6 [2 days ago was in the  300s].  She is on oral daily Lasix 20 mEq.  Daughter came on the phone.  Daughter states patient frequently talks needs and chokes on food and there is an ongoing issue.  Patient initially and yesterday and today was denying this.  She is having some pinkish colored sputum today she is collecting this in a sputum cup.  There is a change in color because normally it is clear.  Interim History / Subjective:   3/12 -got Lasix yesterday and passed out a lot of urine but still on 2 L nasal cannula.  She still continues to have pink sputum and she is frustrated by that.  She feels that hypertonic saline neb is helping her but the sputum has not resolved.  ABG shows chronic hypercapnia.  She believes this is because she is been reluctant to wear CPAP because of facial intolerance.  She is willing to do BiPAP after following up with Dr. Halford Chessman in the pulmonary clinic.  Her creatinine is better after Lasix.  Objective:  Blood pressure 111/62, pulse 76, temperature 98 F (36.7 C), temperature source Oral, resp. rate 18, height 5' 3"  (1.6 m), weight 97.8 kg, SpO2 97 %.        Intake/Output Summary (Last 24 hours) at 06/20/2021 1822 Last data filed at 06/20/2021 1300 Gross per 24 hour  Intake 1440 ml  Output 2350 ml  Net -910 ml   Filed Weights   06/18/21 0714 06/19/21 0449 06/20/21 0500  Weight: 100.8 kg 101.2 kg 97.8 kg   Physical Examination: General Appearance:  Looks stable.  Obese.  Lying in the bed.  Watching TV Head:  Normocephalic, without obvious abnormality, atraumatic Eyes:  PERRL - yes, conjunctiva/corneas - muddy     Ears:  Normal external ear canals, both ears Nose:  G tube - no but has [place] o2 Throat:  ETT TUBE - no , OG tube - no Neck:  Supple,  No enlargement/tenderness/nodules Lungs: Clear to auscultation bilaterally,  Heart:  S1 and S2 normal, no murmur, CVP - na.  Pressors - no Abdomen:  Soft, no masses, no organomegaly Genitalia / Rectal:  Not done Extremities:  Extremities-  intact Skin:  ntact in exposed areas . Sacral area - not examined Neurologic:  Sedation - none -> RASS - +1 . Moves all 4s - yes. CAM-ICU - neg . Orientation - x3+       Resolved Hospital Problem List:    Assessment & Plan:   Ms. Kang is seen in consultation at the request of Dr. [place] Crane [facility]) for further evaluation and management of chronic cough/congestion.   88 year old woman who presented to [facility] ED with worsening dyspnea (noticed over the last 6 months). HFpEF (Echo 1/10 with EF 55-60%), PAF (on Eliquis), chronic anemia, OSA (does not tolerate CPAP, wears 2LNC), mild restrictive lung disease (based on PFTs 2017) with question of small airway disease, GERD, diverticulosis, CKD stage IIIb, stress incontinence, former smoker.  [place] issues on amio, coeg, losartan. Chronic pain - on norco  She has had nonspecific chronic cough and congestion for several months and per patient this feels reminiscent of her symptoms in 2012 and 2017 during which she underwent more extensive pulmonary workup. Likely no acute process occurring at this time. CT Chest is pending.  Chronic cough OSA Mild restrictive lung disease vs. small airway disease  CT chest with some lower lobe infiltrates viral versus aspiration - Procalcitonin profile argues against infection - hx of choking on food at home -History of long-term amiodarone intake  06/20/2021: Continued pink sputum.  ABG with hypercapnia.  However creatinine and BUN  improved after Lasix x1  Plan  - Continue empiric Unasyn for now - can consider DC 06/21/21 given normal PCT and stabiliuty - 3% saline nebulizer helps sputum expectoration - Sputum Gram stain and culture -await fom 06/19/21 -Await respiratory virus panel and urine Streptococcus 06/19/21  - message  sent to RN/[facility] 3/12 to ensure it was sent) - repeat lasix 66m IV x 1 ( got 1 IV dose yesterday; baseline is on 20 mg p.o. daily] - check ESR and If high consider amio lung tox -  await opd swllow eval -Likely needs outpatient BiPAP nightly -can be set up after she visits with Dr. SHalford Chessman   Overall simple medical management and palliative approach only possible Reestablish outpatient management Dr. SHalford Chessman Best Practice: (right click and "Reselect all SmartList Selections" daily)   Future Appointments  Date Time Provider DOfferle 06/28/2021  1:00 PM CVerneda SkillPCV-PCV None     LABS    PULMONARY Recent Labs  Lab 06/19/21 1424  PHART 7.41  PCO2ART 62*  PO2ART 82*  HCO3 39.3*  O2SAT 98.3    CBC Recent Labs  Lab 06/17/21 0525 06/18/21 0438 06/20/21 0524  HGB 8.8* 8.5* 8.7*  HCT 28.8* 28.0* 29.6*  WBC 8.5 8.7 7.4  PLT 400 367 377    COAGULATION No results for input(s): INR in the last 168 hours.  CARDIAC  No results for input(s): TROPONINI in the last 168 hours. No results for input(s): PROBNP in the last 168 hours.   CHEMISTRY Recent Labs  Lab 06/15/21 1712 06/16/21 0644 06/20/21 0524 06/20/21 0536  NA 137 136  --  138  K 4.2 4.0  --  4.3  CL 93* 95*  --  93*  CO2 33* 32  --  37*  GLUCOSE 98 240*  --  100*  BUN 52* 50*  --  36*  CREATININE 2.00* 1.59*  --  1.28*  CALCIUM 8.7* 8.7*  --  8.5*  MG  --   --  2.2  --   PHOS  --   --  4.2  --    Estimated Creatinine Clearance: 35.2 mL/min (A) (by C-G formula based on SCr of 1.28 mg/dL (H)).   LIVER No results for input(s): AST, ALT, ALKPHOS, BILITOT, PROT, ALBUMIN, INR in the last 168 hours.   INFECTIOUS Recent Labs  Lab 06/18/21 1842 06/19/21 0534 06/20/21 0524  PROCALCITON <0.10 <0.10 <0.10     ENDOCRINE CBG (last 3)  No results for input(s): GLUCAP in the last 72 hours.        IMAGING x48h  - image(s) personally visualized  -   highlighted in bold No results found.

## 2021-06-20 NOTE — Progress Notes (Signed)
Assumed care of pt from previous RN. Agree with RN assessment. OT working with pt now. Will continue to monitor pt. ?

## 2021-06-20 NOTE — Progress Notes (Signed)
PHARMACY NOTE:  ANTIMICROBIAL RENAL DOSAGE ADJUSTMENT ? ?Current antimicrobial regimen includes a mismatch between antimicrobial dosage and estimated renal function.  As per policy approved by the Pharmacy & Therapeutics and Medical Executive Committees, the antimicrobial dosage will be adjusted accordingly. ? ?Current antimicrobial dosage:  Unasyn 3g IV q12 hours ? ?Indication: aspiration pneumonia ? ?Renal Function: ? ?Estimated Creatinine Clearance: 35.2 mL/min (A) (by C-G formula based on SCr of 1.28 mg/dL (H)). ?'[]'$      On intermittent HD, scheduled: ?'[]'$      On CRRT ?   ?Antimicrobial dosage has been changed to:  Unasyn 3g IV q6 hours ? ?Additional comments: ? ? ?Thank you for allowing pharmacy to be a part of this patient's care. ? ?Dimple Nanas, [facility] ?06/20/2021 5:54 PM ? ? ?  ? ?

## 2021-06-20 NOTE — Progress Notes (Signed)
Occupational Therapy Treatment ?Patient Details ?Name: Lisa Mcclure ?MRN: [id number] ?DOB: 03-Sep-1932 ?Today's Date: 06/20/2021 ? ? ?History of present illness 88 year old female admitted with dyspnea. PMH: HFpEF, paroxysmal atrial fibrillation on Eliquis, CKD stage IIIb, HTN, hypothyroidism, small airway disease, anemia, venous insufficiency, AAA s/p endovascular stent graft, and OSA intolerant to CPAP using supplemental O2 via [place] ?  ?OT comments ? Patient is not agreeable to mobilize so treatment focused on education and potential needs for possible discharge tomorrow. Patient has all needed DME, is used to oxygen, verbalizes how to perform breathing techniques and how she already uses compensatory strategies for ADLs. She has assistance for cleaning and groceries. She has no concern in regards to going home. She reports ambulating in the hall with nursing and has been performing toileting. Do not expect patient will need OT services at discharge.  ? ?Recommendations for follow up therapy are one component of a multi-disciplinary discharge planning process, led by the attending physician.  Recommendations may be updated based on patient status, additional functional criteria and insurance authorization. ?   ?Follow Up Recommendations ? No OT follow up  ?  ?Assistance Recommended at Discharge PRN  ?Patient can return home with the following ? A little help with bathing/dressing/bathroom;Assistance with cooking/housework;Help with stairs or ramp for entrance;Assist for transportation ?  ?Equipment Recommendations ? None recommended by OT  ?  ?Recommendations for Other Services   ? ?  ?Precautions / Restrictions Precautions ?Precautions: Fall ?Precaution Comments: monitor sats ?Restrictions ?Weight Bearing Restrictions: No  ? ? ?  ? ?Mobility Bed Mobility ?  ?  ?  ?  ?  ?  ?  ?  ?  ? ?Transfers ?  ?  ?  ?  ?  ?  ?  ?  ?  ?  ?  ?  ?Balance   ?  ?  ?  ?  ?  ?  ?  ?  ?  ?  ?  ?  ?  ?  ?  ?  ?  ?  ?   ? ?ADL either  performed or assessed with clinical judgement  ? ?ADL   ?  ?  ?  ?  ?  ?  ?  ?  ?  ?  ?  ?  ?  ?  ?  ?  ?  ?  ?  ?General ADL Comments: Patient not agreeable to mobilize so treatemnt focused on education in regards to compensatory strategies, DME needs and energy conservation. Patient well versed and has all needed equipment, used to home oxygen, and already uses compensatory strategies for dressing but reports she still struggles. Verbalizes understanding of how to perform breathing techniques for recovery. ?  ? ?Extremity/Trunk Assessment   ?  ?  ?  ?  ?  ? ?Vision   ?  ?  ?Perception   ?  ?Praxis   ?  ? ?Cognition Arousal/Alertness: Awake/alert ?Behavior During Therapy: [facility] for tasks assessed/performed ?Overall Cognitive Status: Within Functional Limits for tasks assessed ?  ?  ?  ?  ?  ?  ?  ?  ?  ?  ?  ?  ?  ?  ?  ?  ?  ?  ?  ?   ?Exercises   ? ?  ?Shoulder Instructions   ? ? ?  ?General Comments    ? ? ?Pertinent Vitals/ Pain       Pain Assessment ?Pain Assessment: No/denies pain ? ?  Home Living   ?  ?  ?  ?  ?  ?  ?  ?  ?  ?  ?  ?  ?  ?  ?  ?  ?  ?  ? ?  ?Prior Functioning/Environment    ?  ?  ?  ?   ? ?Frequency ? Min 2X/week  ? ? ? ? ?  ?Progress Toward Goals ? ?OT Goals(current goals can now be found in the care plan section) ? Progress towards OT goals: Progressing toward goals ? ?Acute Rehab OT Goals ?Patient Stated Goal: to go home ?OT Goal Formulation: With patient ?Time For Goal Achievement: 06/30/21 ?Potential to Achieve Goals: Good  ?Plan Discharge plan remains appropriate   ? ?Co-evaluation ? ? ?   ?  ?  ?  ?  ? ?  ?AM-PAC OT "6 Clicks" Daily Activity     ?Outcome Measure ? ? Help from another person eating meals?: None ?Help from another person taking care of personal grooming?: A Little ?Help from another person toileting, which includes using toliet, bedpan, or urinal?: A Little ?Help from another person bathing (including washing, rinsing, drying)?: A Little ?Help from another person to put on  and taking off regular upper body clothing?: A Little ?Help from another person to put on and taking off regular lower body clothing?: A Little ?6 Click Score: 19 ? ?  ?End of Session Equipment Utilized During Treatment: Oxygen ? ?OT Visit Diagnosis: Unsteadiness on feet (R26.81);Other abnormalities of gait and mobility (R26.89) ?  ?Activity Tolerance Patient tolerated treatment well ?  ?Patient Left in bed;with call bell/phone within reach ?  ?Nurse Communication  (Okay to see) ?  ? ?   ? ?Time: 6073-7106 ?OT Time Calculation (min): 12 min ? ?Charges: OT General Charges ?$OT Visit: 1 Visit ?OT Treatments ?$Self Care/Home Management : 8-22 mins ? ?Shanie Mauzy, OTR/L ?Acute Care Rehab Services  ?Office [phone] ?Pager: [phone]  ? ?Mazie Fencl L Ravon Mortellaro ?06/20/2021, 3:18 PM ?

## 2021-06-21 ENCOUNTER — Inpatient Hospital Stay (HOSPITAL_COMMUNITY): Payer: Medicare Other

## 2021-06-21 ENCOUNTER — Encounter (HOSPITAL_COMMUNITY): Payer: Self-pay | Admitting: Internal Medicine

## 2021-06-21 ENCOUNTER — Telehealth: Payer: Self-pay | Admitting: *Deleted

## 2021-06-21 DIAGNOSIS — R058 Other specified cough: Secondary | ICD-10-CM | POA: Diagnosis not present

## 2021-06-21 DIAGNOSIS — J691 Pneumonitis due to inhalation of oils and essences: Secondary | ICD-10-CM

## 2021-06-21 DIAGNOSIS — R0982 Postnasal drip: Secondary | ICD-10-CM | POA: Diagnosis not present

## 2021-06-21 DIAGNOSIS — J690 Pneumonitis due to inhalation of food and vomit: Secondary | ICD-10-CM

## 2021-06-21 DIAGNOSIS — K219 Gastro-esophageal reflux disease without esophagitis: Secondary | ICD-10-CM

## 2021-06-21 LAB — BRAIN NATRIURETIC PEPTIDE: B Natriuretic Peptide: 353 pg/mL — ABNORMAL HIGH (ref 0.0–100.0)

## 2021-06-21 LAB — RESPIRATORY PANEL BY PCR

## 2021-06-21 LAB — CBC
HCT: 29.4 % — ABNORMAL LOW (ref 36.0–46.0)
Hemoglobin: 8.7 g/dL — ABNORMAL LOW (ref 12.0–15.0)
MCH: 26.8 pg (ref 26.0–34.0)
MCHC: 29.6 g/dL — ABNORMAL LOW (ref 30.0–36.0)
MCV: 90.5 fL (ref 80.0–100.0)
Platelets: 376 10*3/uL (ref 150–400)
RBC: 3.25 MIL/uL — ABNORMAL LOW (ref 3.87–5.11)
RDW: 17.8 % — ABNORMAL HIGH (ref 11.5–15.5)
WBC: 7.6 10*3/uL (ref 4.0–10.5)
nRBC: 0 % (ref 0.0–0.2)

## 2021-06-21 LAB — STREP PNEUMONIAE URINARY ANTIGEN: Strep Pneumo Urinary Antigen: NEGATIVE

## 2021-06-21 LAB — SEDIMENTATION RATE: Sed Rate: 50 mm/hr — ABNORMAL HIGH (ref 0–22)

## 2021-06-21 MED ORDER — PANTOPRAZOLE SODIUM 40 MG PO TBEC: 40.0000 mg | DELAYED_RELEASE_TABLET | Freq: Two times a day (BID) | ORAL | 0 refills | Status: DC

## 2021-06-21 MED ORDER — PANTOPRAZOLE SODIUM 40 MG PO TBEC: 40.0000 mg | DELAYED_RELEASE_TABLET | Freq: Every day | ORAL | 0 refills | Status: DC

## 2021-06-21 MED ORDER — SALINE SPRAY 0.65 % NA SOLN
2.0000 | Freq: Two times a day (BID) | NASAL | Status: DC
  Filled 2021-06-21: qty 44

## 2021-06-21 MED ORDER — FLUTICASONE PROPIONATE 50 MCG/ACT NA SUSP
1.0000 | Freq: Every day | NASAL | Status: DC
  Administered 2021-06-21: 1 via NASAL
  Filled 2021-06-21: qty 16

## 2021-06-21 MED ORDER — FERROUS SULFATE 325 (65 FE) MG PO TABS: 325.0000 mg | ORAL_TABLET | Freq: Every day | ORAL | 0 refills | Status: DC

## 2021-06-21 MED ORDER — AMOXICILLIN-POT CLAVULANATE 875-125 MG PO TABS: 1.0000 | ORAL_TABLET | Freq: Two times a day (BID) | ORAL | 0 refills | Status: AC

## 2021-06-21 MED ORDER — CARVEDILOL 6.25 MG PO TABS: 6.2500 mg | ORAL_TABLET | Freq: Two times a day (BID) | ORAL | 0 refills | Status: DC

## 2021-06-21 NOTE — Progress Notes (Signed)
Pt discharged home today per Dr. [place] Crane. Pt's IV site D/C'd and WDL. Pt's VSS. Pt provided with home medication list, discharge instructions and prescriptions. Verbalized understanding. Pt left floor via WC in stable condition accompanied by NT.  ?

## 2021-06-21 NOTE — Progress Notes (Signed)
PHYSICAL THERAPY ? ?SATURATION QUALIFICATIONS: (This note is used to comply with regulatory documentation for home oxygen) ? ?Patient Saturations on Room Air at Rest = 89% ? ?Patient Saturations on Room Air while Ambulating 35 feet = 86% with mild c/o chest "tightness" ? ?Patient Saturations on 2 Liters of oxygen while Ambulating = 90% ? ?Please briefly explain why patient needs home oxygen:  Pt did require supplemental oxygen during activity to achieve therapeutic levels. ? ?Rica Koyanagi  PTA ?Acute  Rehabilitation Services ?Pager      [phone] ?Office      [phone] ? ?

## 2021-06-21 NOTE — Telephone Encounter (Signed)
Spoke with Velna Hatchet NP who is working with Dr. Halford Chessman at the hospital.  They are requesting for the patient to be seen in 1-2 weeks.  Scheduled patient on 06/28/21 at 11:45 am (15 minute slot).  Dr. Halford Chessman said it was ok to double book if necessary.  Nothing further needed. ?

## 2021-06-21 NOTE — Progress Notes (Addendum)
? ?NAME:  Lisa Mcclure, MRN:  [id number], DOB:  1932-08-27, LOS: 5 ?ADMISSION DATE:  06/15/2021 CONSULTATION DATE:  06/18/2021 ?REFERRING MD:  [place] Crane [facility] CHIEF COMPLAINT:  Congestion, chronic cough, dyspnea ? ?BRIEF  ?88 year old woman who presented to [facility] ED with worsening dyspnea. PMHx significant for HTN, HFpEF (Echo 1/10 with EF 55-60%), PAF (on Eliquis), chronic anemia, OSA (does not tolerate CPAP, wears 2LNC), mild restrictive lung disease (based on PFTs 2017) with question of small airway disease, GERD, diverticulosis, CKD stage IIIb, stress incontinence, former smoker.On chronic amio.  ? ?Patient reports that she has had a persistent cough/upper airway congestion for approximately 6 months; despite interventions this has not resolved.  She had a similar chronic cough years ago for which she underwent extensive pulmonary work-up with Dr. Halford Chessman (2012) / Dr. Vaughan Browner (2017).  This eventually resolved after several interventions, but she feels antibiotics were the most significant of these interventions. ? ?Reported on admit persistent productive cough, she is unable to get sputum up describing it as "Elmer's glue".  Reports that she has attempted to use an albuterol inhaler at home but struggles with this due to fine motor coordination.  It sounds as if she was provided with nebulizer vials but does not have a nebulizer at home.  She does utilize 2L [place] nightly for sleep apnea, as she is unable to tolerate her CPAP.  More recently she has required 2L [place] for activities requiring exertion such as making the bed, common ADLs. Denies fever/chills, recent sick contacts, CP, n/v/abdominal pain, dizziness or lightheadedness. Endorses SOB, DOE, LE edema (no worse than baseline). ? ?PCCM consulted for assistance with management. ? ? ? ?PMH:   ?Anemia ?Obesity  ?Chronic Diastolic CHF  ?PAF  ?GERD ?Hypothyroidism ?OSA  ?Rheumatoid Arthritis  ?HTN  ?Overactive Bladder  ?Osteoarthritis  ? ?Significant Hospital  Events: ?Including procedures, antibiotic start and stop dates in addition to other pertinent events   ?3/7 Admit with SOB, Anemia  ?3/10 PCCM consulted for chronic cough, chest congestion.  CT chest with scattered lower lobe infiltrates c/w viral or aspiration vs fatty infiltration with amiodarone use. Started on unasyn empirically. Upper endoscopy performed per Dr. Therisa Doyne with one non-bleeding superficial gastric ulcer with clean ulcer base (Forrest Class III) in the gastric body.  The leasion was 47m in largest dimension, s/p biopsy ?3/11 2L O2.  Afebrile/normal WBC. Neg troponin, BNP elevated 519, on oral lasix. Daughter reports concern for patient choking on food.  Pink sputum / normally clear.  ?3/12 Lasix, made "a lot of urine" but remains on O2 2L.  Pink sputum.  ABG with chronic hypercapnia. Has not tolerated CPAP with facial intolerance of mask.  Cr improved with lasix.  ? ?Interim History / Subjective:  ?Afebrile  ?I/O 2.8L UOP, -1.4L in last 24 hours  ?On O2 3L ?Pt reports she is ready to go home, notes she gets choked on phlegm ?Reports she uses vicks vaporub at night on her face ? ?Objective:  ?Blood pressure 117/76, pulse 65, temperature 98 ?F (36.7 ?C), temperature source Oral, resp. rate 20, height '5\' 3"'$  (1.6 m), weight 96.8 kg, SpO2 98 %. ?   ?   ? ?Intake/Output Summary (Last 24 hours) at 06/21/2021 1314 ?Last data filed at 06/21/2021 1000 ?Gross per 24 hour  ?Intake 1120 ml  ?Output 1650 ml  ?Net -530 ml  ? ?Filed Weights  ? 06/19/21 0449 06/20/21 0500 06/21/21 0502  ?Weight: 101.2 kg 97.8 kg 96.8 kg  ? ?Physical  Examination: ?General: elderly adult female lying in bed in NAD  ?HEENT: MM pink/moist, fair dentition, moist cough, anicteric, wearing glasses  ?Neuro: AAOx4, speech clear, MAE  ?CV: s1s2 RRR, SEM ?PULM: non-labored at rest on 3L, lungs bilaterally clear  ?GI: soft, bsx4 active  ?Extremities: warm/dry, 1+ BLE pitting edema  ?Skin: no rashes or lesions ? ?Resolved Hospital Problem List:   ? ? ?Assessment & Plan:  ?   ? ?88 year old woman who presented to [facility] ED with worsening dyspnea (noticed over the last 6 months), cough & nasal stuffiness. HFpEF (Echo 1/10 with EF 55-60%), PAF (on Eliquis), chronic anemia, OSA (does not tolerate CPAP, wears 2LNC), mild restrictive lung disease (based on PFTs 2017) with question of small airway disease, GERD, diverticulosis, CKD stage IIIb, stress incontinence, former smoker.  Cardiac issues on amio, coeg, losartan. Chronic pain - on norco ? ?She has had nonspecific chronic cough and congestion for several months and per patient this feels reminiscent of her symptoms in 2012 and 2017 during which she underwent more extensive pulmonary workup.   ? ?Chronic Cough ?Suspected Aspiration PNA with LPR vs Lipoid PNA  ?Untreated OSA ?Chronic Hypercarbic Respiratory Failure ?Mild restrictive lung disease vs. small airway disease  ?GERD ? ?CT Chest with patchy GGO in the posterior RUL, RLL > aspiration vs viral PNA vs lipoid PNA from Vicks Vaporub around face at night.  PCT argues against bacterial infection. Hx of long term amiodarone use but imaging not consistent with toxicity, recent choking on foods per family & significant acid reflux. Suspected GERD component here as well.  ? ?-PCT remains negative, strep pneumo antigen negative ?-D2 abx > augmentin per TR[facility] for 5 days after discharge for total of 7 days ?-BNP improved with diuresis  ?-sputum culture negative to date, follow up in clinic  ?-hospital follow up made with Dr. Halford Chessman for chronic hypercarbic respiratory failure, cough  ?-given CT findings, less likely amiodarone lung toxicity (only small RUL findings).   ?-she is intolerant CPAP/ positive pressure  ?-note she had a PAS of 40 in 2017 on ECHO, given untreated OSA, ? If this is excess fluid in the setting of PH, ? If she should have her diuretic regimen adjusted, defer to Dr. Einar Gip. May need repeat ECHO at some point as well.  ?-PPI  ?-nasal saline for  discharge BID ?-add flonase for nasal drainage  ?-assess ambulatory O2 needs prior to discharge ?-pt instructed on low acid / reflux diet, reflux precautions  ?-pt instructed not to use vicks vapo rub on face, ok for chest ? ? ?OK to go home from pulmonary standpoint.  ? ?Best Practice: (right click and "Reselect all SmartList Selections" daily)  ?Per Primary  ? ?  ? ?Noe Gens, MSN, APRN, NP-C, AGACNP-BC ?[facility] ?06/21/2021, 1:15 PM ? ? ?Please see [email or web address] for pager details.  ? ?From 7A-7P if no response, please call [phone] ?After hours, please call Warren Lacy [phone] ? ? ?

## 2021-06-21 NOTE — Progress Notes (Signed)
?  Modified Barium Swallow Progress Note ? ?Patient Details  ?Name: Lisa Mcclure ?MRN: [id number] ?Date of Birth: October 09, 1932 ? ?Today's Date: 06/21/2021 ? ?Modified Barium Swallow completed.  Full report located under Chart Review in the Imaging Section. ? ?Brief recommendations include the following: ? ?Clinical Impression ? Patient presents with normal oral phase and a mildly impaired pharyngeal phase of swallow as per this MBS. She exhibited swallow initiation delay to level of vallecular sinus with puree and regular texture solids and barium tablet became briefly lodged in vallecular sinus when taken with thin liquid barium. Post initial swallows, patient had trace vallecular and pyriform sinus residuals with thin liquids, trace to mild vallecular residuals with puree solids and mild vallecular and posterior pharyngeal wall residuals with regular texture solids.2-3 extra sips of thin liquid barium helped to fully transit barium tablet and a single sip of thin liquid barium helped fully transit regular texture residuals. Cervical esophageal phase of swallow appeared [facility]. No penetration or aspiration occured with any of the tested barium consistencies. SLP provided patient with recommendations to drink sips of liquids after every few bites of solid foods and to drink some water prior to PO's to help lubricate oropharyngeal tissues. Patient reported that she rarely drinks liquids with her meals. No further SLP intervention needed at this time. ?  ?Swallow Evaluation Recommendations ? ?   ? ? SLP Diet Recommendations: Regular solids;Thin liquid ? ? Liquid Administration via: Cup;Straw ? ? Medication Administration: Whole meds with liquid ? ? Supervision: Patient able to self feed ? ? Compensations: Small sips/bites;Slow rate ? ? Postural Changes: Seated upright at 90 degrees ? ? Oral Care Recommendations: Oral care BID;Patient independent with oral care ? ?   ? ? ?Sonia Baller, MA, CCC-SLP ?Speech Therapy ? ?

## 2021-06-21 NOTE — Progress Notes (Signed)
Physical Therapy Treatment ?Patient Details ?Name: Lisa Mcclure ?MRN: [id number] ?DOB: 29-Nov-1932 ?Today's Date: 06/21/2021 ? ? ?History of Present Illness 88 year old female admitted with dyspnea. PMH: HFpEF, paroxysmal atrial fibrillation on Eliquis, CKD stage IIIb, HTN, hypothyroidism, small airway disease, anemia, venous insufficiency, AAA s/p endovascular stent graft, and OSA intolerant to CPAP using supplemental O2 via [place] ? ?  ?PT Comments  ? ? General Comments: AxO x 3 very pleasant and motivated Lady.  Pt was sitting Indep EOB on 2 lts sats at 95%.  Trial removed oxygen.   ?SATURATION QUALIFICATIONS: (This note is used to comply with regulatory documentation for home oxygen) ?  ?Patient Saturations on Room Air at Rest = 89% ?  ?Patient Saturations on Room Air while Ambulating 35 feet = 86% with mild c/o chest "tightness" ?  ?Patient Saturations on 2 Liters of oxygen while Ambulating = 90% ?  ?Please briefly explain why patient needs home oxygen:  Pt did require supplemental oxygen during activity to achieve therapeutic levels.  ? ?General Gait Details: Light use of RW, steady gait, standing rest break to monitor O2; dyspnea 2/4.  Trial RA decreased to 86% with mild c/o chest "tightness".  Required 2 lts supplemental oxygen to achieve therapeutic sats. ? ?Pt plans to D/C back home.   ? ?  ?Recommendations for follow up therapy are one component of a multi-disciplinary discharge planning process, led by the attending physician.  Recommendations may be updated based on patient status, additional functional criteria and insurance authorization. ? ?Follow Up Recommendations ? Home health PT ?  ?  ?Assistance Recommended at Discharge PRN  ?Patient can return home with the following Assist for transportation;Assistance with cooking/housework;Help with stairs or ramp for entrance ?  ?Equipment Recommendations ? Other (comment) (oxygen)  ?  ?Recommendations for Other Services   ? ? ?  ?Precautions / Restrictions  Precautions ?Precaution Comments: monitor sats ?Restrictions ?Weight Bearing Restrictions: No  ?  ? ?Mobility ? Bed Mobility ?  ?  ?  ?  ?  ?  ?  ?General bed mobility comments: sitting EOB Indep on arrival "my legs are too short for that recliner". ?  ? ?Transfers ?Overall transfer level: Needs assistance ?Equipment used: Rolling walker (2 wheels), None ?Transfers: Sit to/from Stand, Bed to chair/wheelchair/BSC ?Sit to Stand: Supervision ?Stand pivot transfers: Supervision ?  ?  ?  ?  ?General transfer comment: Physically self able with good use of hands to steady self.  Assisted on/off BSC.  Good safety cognition/awareness. ?  ? ?Ambulation/Gait ?Ambulation/Gait assistance: Supervision ?Gait Distance (Feet): 115 Feet ?Assistive device: Rolling walker (2 wheels) ?Gait Pattern/deviations: Step-through pattern ?Gait velocity: WNL ?  ?  ?General Gait Details: Light use of RW, steady gait, standing rest break to monitor O2; dyspnea 2/4.  Trial RA decreased to 86% with mild c/o chest "tightness".  Required 2 lts supplemental oxygen to achieve therapeutic sats. ? ? ?Stairs ?  ?  ?  ?  ?  ? ? ?Wheelchair Mobility ?  ? ?Modified Rankin (Stroke Patients Only) ?  ? ? ?  ?Balance   ?  ?  ?  ?  ?  ?  ?  ?  ?  ?  ?  ?  ?  ?  ?  ?  ?  ?  ?  ? ?  ?Cognition Arousal/Alertness: Awake/alert ?  ?Overall Cognitive Status: Within Functional Limits for tasks assessed ?  ?  ?  ?  ?  ?  ?  ?  ?  ?  ?  ?  ?  ?  ?  ?  ?  General Comments: AxO x 3 very pleasant and motivated Lady ?  ?  ? ?  ?Exercises   ? ?  ?General Comments   ?  ?  ? ?Pertinent Vitals/Pain Pain Assessment ?Pain Assessment: No/denies pain  ? ? ?Home Living   ?  ?  ?  ?  ?  ?  ?  ?  ?  ?   ?  ?Prior Function    ?  ?  ?   ? ?PT Goals (current goals can now be found in the care plan section) Progress towards PT goals: Progressing toward goals ? ?  ?Frequency ? ? ? Min 3X/week ? ? ? ?  ?PT Plan Current plan remains appropriate  ? ? ?Co-evaluation   ?  ?  ?  ?  ? ?  ?AM-PAC PT "6  Clicks" Mobility   ?Outcome Measure ? Help needed turning from your back to your side while in a flat bed without using bedrails?: None ?Help needed moving from lying on your back to sitting on the side of a flat bed without using bedrails?: None ?Help needed moving to and from a bed to a chair (including a wheelchair)?: None ?Help needed standing up from a chair using your arms (e.g., wheelchair or bedside chair)?: None ?Help needed to walk in hospital room?: A Little ?Help needed climbing 3-5 steps with a railing? : A Little ?6 Click Score: 22 ? ?  ?End of Session Equipment Utilized During Treatment: Gait belt;Oxygen ?Activity Tolerance: Patient tolerated treatment well ?Patient left: with call bell/phone within reach;with family/visitor present;in bed ?Nurse Communication: Mobility status ?PT Visit Diagnosis: Unsteadiness on feet (R26.81);Difficulty in walking, not elsewhere classified (R26.2) ?  ? ? ?Time: 2979-8921 ?PT Time Calculation (min) (ACUTE ONLY): 28 min ? ?Charges:  $Gait Training: 8-22 mins ?$Therapeutic Activity: 8-22 mins          ?          ? ?Rica Koyanagi  PTA ?Acute  Rehabilitation Services ?Pager      [phone] ?Office      [phone] ? ?

## 2021-06-21 NOTE — Progress Notes (Signed)
SATURATION QUALIFICATIONS: (This note is used to comply with regulatory documentation for home oxygen) ? ?Patient Saturations on Room Air at Rest = 95% ? ?Patient Saturations on Room Air while Ambulating = 86% ? ?Patient Saturations on 3 Liters of oxygen while Ambulating = 91% ? ?Please briefly explain why patient needs home oxygen: Pt will require home O2 at 3L via [place] to keep O2 saturations above 88%. ?

## 2021-06-21 NOTE — TOC Transition Note (Signed)
Transition of Care (TOC) - CM/SW Discharge Note ? ? ?Patient Details  ?Name: Lisa Mcclure ?MRN: [id number] ?Date of Birth: Mar 12, 1933 ? ?Transition of Care [facility]) CM/SW Contact:  ?Dessa Phi, RN ?Phone Number: ?06/21/2021, 9:27 AM ? ? ?Clinical Narrative: d/c home w/[facility] PT-rep [facility] aware.[employer] rep Caryl Pina awaiting documented 02 sats. Home tank has already been delivered. No further CM needs.   ? ? ? ?Final next level of care: [place] ?Barriers to Discharge: No Barriers Identified ? ? ?Patient Goals and CMS Choice ?Patient states their goals for this hospitalization and ongoing recovery are:: Home ?CMS Medicare.gov Compare Post Acute Care list provided to:: Patient Represenative (must comment) [name] dtr [id number]) ?Choice offered to / list presented to : Adult Children ? ?Discharge Placement ?  ?           ?  ?  ?  ?  ? ?Discharge Plan and Services ?  ?Discharge Planning Services: CM Consult ?Post Acute Care Choice: Home Health          ?DME Arranged: Oxygen ?DME Agency: [employer] ?Date DME Agency Contacted: 06/21/21 ?Time DME Agency Contacted: [phone] ?Representative spoke with at DME Agency: Caryl Pina ?HH Arranged: PT ?[place] Agency: [place] ?Date HH Agency Contacted: 06/17/21 ?Time [place]: 1200 ?Representative spoke with at [place]: Tommi Rumps ? ?Social Determinants of Health (SDOH) Interventions ?  ? ? ?Readmission Risk Interventions ?Readmission Risk Prevention Plan 12/25/2019  ?Transportation Screening Complete  ?PCP or Specialist Appt within 3-5 Days Complete  ?[place] or Home Care Consult Complete  ?Social Work Consult for [place] Planning/Counseling Complete  ?Palliative Care Screening Not Applicable  ?Medication Review Press photographer) Complete  ?Some recent data might be hidden  ? ? ? ? ? ?

## 2021-06-21 NOTE — Plan of Care (Signed)
?  Problem: Health Behavior/Discharge Planning: ?Goal: Ability to manage health-related needs will improve ?Outcome: Progressing ?  ?Problem: Clinical Measurements: ?Goal: Respiratory complications will improve ?Outcome: Adequate for Discharge ?  ?Problem: Pain Managment: ?Goal: General experience of comfort will improve ?Outcome: Adequate for Discharge ?  ?Problem: Education: ?Goal: Knowledge of General Education information will improve ?Description: Including pain rating scale, medication(s)/side effects and non-pharmacologic comfort measures ?Outcome: Completed/Met ?  ?Problem: Education: ?Goal: Knowledge of General Education information will improve ?Description: Including pain rating scale, medication(s)/side effects and non-pharmacologic comfort measures ?Outcome: Completed/Met ?  ?Problem: Clinical Measurements: ?Goal: Ability to maintain clinical measurements within normal limits will improve ?Outcome: Completed/Met ?Goal: Will remain free from infection ?Outcome: Completed/Met ?Goal: Diagnostic test results will improve ?Outcome: Completed/Met ?Goal: Cardiovascular complication will be avoided ?Outcome: Completed/Met ?  ?Problem: Activity: ?Goal: Risk for activity intolerance will decrease ?Outcome: Completed/Met ?  ?Problem: Nutrition: ?Goal: Adequate nutrition will be maintained ?Outcome: Completed/Met ?  ?Problem: Coping: ?Goal: Level of anxiety will decrease ?Outcome: Completed/Met ?  ?Problem: Elimination: ?Goal: Will not experience complications related to bowel motility ?Outcome: Completed/Met ?Goal: Will not experience complications related to urinary retention ?Outcome: Completed/Met ?  ?Problem: Safety: ?Goal: Ability to remain free from injury will improve ?Outcome: Completed/Met ?  ?Problem: Skin Integrity: ?Goal: Risk for impaired skin integrity will decrease ?Outcome: Completed/Met ?  ?

## 2021-06-21 NOTE — Progress Notes (Signed)
Speech Language Pathology Treatment: Dysphagia  ?Patient Details ?Name: Lisa Mcclure ?MRN: [id number] ?DOB: 1932-04-13 ?Today's Date: 06/21/2021 ?Time: 9244-6286 ?SLP Time Calculation (min) (ACUTE ONLY): 10 min ? ?Assessment / Plan / Recommendation ?Clinical Impression ? Patient seen by SLP for skilled treatment session focused on dysphagia goals. Purpose of this visit was to educate and discuss with patient recommendation from evaluating SLP of having a modified barium swallow test. Patient may be discharging home today. Through discussion with SLP, she reported that she has had instances of coughing and feeling choked up on suspected phlegm at home to the point where she has trouble breathing. She denied having more frequent coughing or throat clearing during or immediately after meals but that she did notice more coughing when she was lying down. After SLP described the test and what results would show [place] about her swallowing, she was in agreement and felt it could be helpful. SLP will schedule this MBS with radiology to be completed today prior to her discharge. ? ?  ?HPI HPI: 88 year old woman who presented to [facility] ED with worsening dyspnea (noticed over the last 6 months). HFpEF (Echo 1/10 with EF 55-60%), PAF (on Eliquis), chronic anemia, OSA (does not tolerate CPAP, wears 2LNC), mild restrictive lung disease (based on PFTs 2017) with question of small airway disease, GERD, diverticulosis, CKD stage IIIb, stress incontinence, former smoker. She has had nonspecific chronic cough and congestion for several months and per patient this feels reminiscent of her symptoms in 2012 and 2017 during which she underwent more extensive pulmonary workup. CT chest with scattered lower lobe infiltrates consistent with viral and may be aspiration and also fatty infiltration consistent with amiodarone intake.. ?  ?   ?SLP Plan ? Continue with current plan of care;MBS ? ?  ?  ?Recommendations for follow up therapy are one  component of a multi-disciplinary discharge planning process, led by the attending physician.  Recommendations may be updated based on patient status, additional functional criteria and insurance authorization. ?  ? ?Recommendations  ?Diet recommendations: Regular;Thin liquid ?Liquids provided via: Cup;Straw ?Medication Administration: Whole meds with liquid ?Compensations: Slow rate;Small sips/bites ?Postural Changes and/or Swallow Maneuvers: Seated upright 90 degrees  ?   ?    ?   ? ? ? ? Oral Care Recommendations: Oral care BID ?Follow Up Recommendations: No SLP follow up ?Assistance recommended at discharge: None ?SLP Visit Diagnosis: Dysphagia, unspecified (R13.10) ?Plan: Continue with current plan of care;MBS ? ? ? ? ?  ?  ? ? ?Sonia Baller, MA, CCC-SLP ?Speech Therapy ? ?

## 2021-06-21 NOTE — Plan of Care (Signed)
?  Problem: Education: ?Goal: Knowledge of General Education information will improve ?Description: Including pain rating scale, medication(s)/side effects and non-pharmacologic comfort measures ?Outcome: Completed/Met ?  ?Problem: Education: ?Goal: Knowledge of General Education information will improve ?Description: Including pain rating scale, medication(s)/side effects and non-pharmacologic comfort measures ?Outcome: Completed/Met ?  ?Problem: Health Behavior/Discharge Planning: ?Goal: Ability to manage health-related needs will improve ?06/21/2021 1708 by Annie Sable, RN ?Outcome: Completed/Met ?06/21/2021 0945 by Annie Sable, RN ?Outcome: Progressing ?  ?Problem: Clinical Measurements: ?Goal: Ability to maintain clinical measurements within normal limits will improve ?Outcome: Completed/Met ?Goal: Will remain free from infection ?Outcome: Completed/Met ?Goal: Diagnostic test results will improve ?Outcome: Completed/Met ?Goal: Respiratory complications will improve ?06/21/2021 1708 by Annie Sable, RN ?Outcome: Completed/Met ?06/21/2021 0945 by Annie Sable, RN ?Outcome: Adequate for Discharge ?Goal: Cardiovascular complication will be avoided ?Outcome: Completed/Met ?  ?Problem: Activity: ?Goal: Risk for activity intolerance will decrease ?Outcome: Completed/Met ?  ?Problem: Nutrition: ?Goal: Adequate nutrition will be maintained ?Outcome: Completed/Met ?  ?Problem: Coping: ?Goal: Level of anxiety will decrease ?Outcome: Completed/Met ?  ?Problem: Elimination: ?Goal: Will not experience complications related to bowel motility ?Outcome: Completed/Met ?Goal: Will not experience complications related to urinary retention ?Outcome: Completed/Met ?  ?Problem: Pain Managment: ?Goal: General experience of comfort will improve ?06/21/2021 1708 by Annie Sable, RN ?Outcome: Completed/Met ?06/21/2021 0945 by Annie Sable, RN ?Outcome: Adequate for Discharge ?  ?Problem: Safety: ?Goal: Ability to remain free  from injury will improve ?Outcome: Completed/Met ?  ?Problem: Skin Integrity: ?Goal: Risk for impaired skin integrity will decrease ?Outcome: Completed/Met ?  ?

## 2021-06-21 NOTE — Discharge Summary (Signed)
Discharge Summary  Lisa Mcclure:[id number] DOB: 11-08-1932  PCP: [name], MD  Admit date: 06/15/2021 Discharge date: 06/21/2021  Time spent: 35 minutes   Recommendations for Outpatient Follow-up:  Follow-up with pulmonary, Dr. Halford Chessman, within a week. Follow-up with your cardiologist Dr. Einar Gip within a week. Follow-up with GI in 2 weeks. Follow-up with your primary care provider in 2 weeks. Take your medications as prescribed. Continue PT OT with assistance and fall precautions.  Discharge Diagnoses:  Active Hospital Problems   Diagnosis Date Noted   Acute respiratory failure with hypoxia ([place]) 06/15/2021    Priority: 1.   Symptomatic anemia 04/19/2015    Priority: 1.   Acute renal failure superimposed on stage 3b chronic kidney disease ([place]) 06/15/2021    Priority: 2.   Chronic heart failure with preserved ejection fraction (HFpEF) ([place])     Priority: 3.   Paroxysmal atrial fibrillation ([place]) 06/15/2021    Priority: 4.   Generalized weakness 06/15/2021   RLS (restless legs syndrome) 07/29/2019   Hypothyroidism    Hypertension 11/04/2013   OSA (obstructive sleep apnea) 11/13/2008    Resolved Hospital Problems  No resolved problems to display.    Discharge Condition: Stable.   Diet recommendation: Resume previous diet.    Speech therapist recommendations:  Swallow Evaluation Recommendations         SLP Diet Recommendations: Regular solids;Thin liquid    Liquid Administration via: Cup;Straw    Medication Administration: Whole meds with liquid    Supervision: Patient able to self feed    Compensations: Small sips/bites;Slow rate    Postural Changes: Seated upright at 90 degrees    Oral Care Recommendations: Oral care BID;Patient independent with oral care   Sonia Baller, MA, CCC-SLP Speech Therapy      Modified barium swallow clinical impression: Clinical Impression             Patient presents with normal oral phase and a  mildly impaired pharyngeal phase of swallow as per this MBS. She exhibited swallow initiation delay to level of vallecular sinus with puree and regular texture solids and barium tablet became briefly lodged in vallecular sinus when taken with thin liquid barium. Post initial swallows, patient had trace vallecular and pyriform sinus residuals with thin liquids, trace to mild vallecular residuals with puree solids and mild vallecular and posterior pharyngeal wall residuals with regular texture solids.2-3 extra sips of thin liquid barium helped to fully transit barium tablet and a single sip of thin liquid barium helped fully transit regular texture residuals. Cervical esophageal phase of swallow appeared [facility]. No penetration or aspiration occured with any of the tested barium consistencies. SLP provided patient with recommendations to drink sips of liquids after every few bites of solid foods and to drink some water prior to PO's to help lubricate oropharyngeal tissues. Patient reported that she rarely drinks liquids with her meals. No further SLP intervention needed at this time.   Home oxygen evaluation resolved 06/21/2021: SATURATION QUALIFICATIONS: (This note is used to comply with regulatory documentation for home oxygen)   Patient Saturations on Room Air at Rest = 95%   Patient Saturations on Room Air while Ambulating = 86%   Patient Saturations on 3 Liters of oxygen while Ambulating = 91%   Please briefly explain why patient needs home oxygen: Pt will require home O2 at 3L via [place] to keep O2 saturations above 88%.         Note Details  Author Vernell Barrier, Fraser Din,  RN File Time 06/21/2021 10:10 AM  Author Type Registered Nurse Status Signed  Last Editor Annie Sable, RN Service (none)  [place] # [id number] Admit Date 06/15/2021        Vitals:   06/21/21 0816 06/21/21 0958  BP:    Pulse:  (!) 57  Resp:    Temp:    SpO2: 95% 97%    History of present illness:  Lisa Mcclure  is a 88 y.o. female with medical history significant for HFpEF (EF 55-60% 04/20/2021), paroxysmal atrial fibrillation on Eliquis, CKD stage IIIb, HTN, hypothyroidism, small airway disease, iron deficiency anemia, venous insufficiency, AAA s/p endovascular stent graft (12/2019), and OSA intolerant to home CPAP using supplemental O2 via [place] 2L qhs who presented to [facility] ED for evaluation of worsening exertional dyspnea, worsening chronic chest congestion, fatigue, and generalized weakness.  She has been less mobile than usual due to chronic osteoarthritis pain.  Work-up revealed symptomatic anemia with hemoglobin of 7.4 K, severe iron deficiency, post 1 unit PRBCs transfusion on 06/16/2021.  Seen by GI, post EGD on 06/18/2021.  It revealed: non-bleeding gastric ulcer with a clean ulcer base (Forrest Class III). Biopsied.  Erythematous mucosa in the antrum. Biopsied.  Normal examined duodenum.  On 06/18/2021, a CT chest without contrast was done due to persistent chest congestion.  It revealed concern for pneumonia, lower lobe infiltrates, viral versus aspiration.    Acute respiratory virus panel and urine antigen Streptococcus all negative.  Seen by pulmonary.  Recommended follow-up with Dr. Halford Chessman outpatient.  BiPAP will also be arranged outpatient for chronic CO2 retention.  Patient was evaluated by speech therapist, passed her swallow evaluation.  Modified barium swallow completed on 06/21/2021.     06/21/2021: Patient was seen at bedside.  There were no acute events overnight.  She has no new complaints.  Vital signs and labs were reviewed and are stable.  Hospital Course:  Principal Problem:   Acute respiratory failure with hypoxia (HCC) Active Problems:   Symptomatic anemia   Acute renal failure superimposed on stage 3b chronic kidney disease (HCC)   Chronic heart failure with preserved ejection fraction (HFpEF) (HCC)   Paroxysmal atrial fibrillation (HCC)   OSA (obstructive sleep apnea)   Hypertension    Hypothyroidism   RLS (restless legs syndrome)   Generalized weakness  Acute respiratory failure with hypoxia and hypercarbia (HCC) Chronic cough OSA Mild restrictive lung disease versus small airway disease Prior to admission with exertional dyspnea with SPO2 as low as 86% while on 2 L O2 via [place].  Endorses chest congestion for weeks. CXR 06/15/21 without evidence of pneumonia or pulmonary edema.  Due to unrevealing chest x-ray, a CT chest was done. CT chest done on 06/18/2021 concerning for pneumonia, viral versus aspiration. Received 3 days Unasyn, pulmonary toilet ABG completed on 06/19/2021 7.41/62/82 Sputum culture growing few gram-positive cocci, few gram-positive rods, few gram-negative rods. Patient endorses noncompliance with her home CPAP as it is uncomfortable to wear. We will follow-up with pulmonary for possible BiPAP arrangement at outpatient pulmonary clinic.  Chronic use of amiodarone Sed rate 50 Unclear if contributing to lung issue Defer to cardiology and pulmonary to address Follow-up with cardiology within a week, follow-up with pulmonary within a week.   Symptomatic anemia, suspect secondary to chronic GI losses from nonbleeding gastric ulcer seen on EGD done on 06/18/2021. Hemoglobin stable 8.5>> 8.7K No overt bleeding Received 1 unit PRBC for hemoglobin of 7.4K after admission to the hospital. Continue ferrous  sulfate daily Follow-up with GI   Resolved AKI on CKD 3B She appears to be at her baseline creatinine 1.28 with GFR 41. Continue to avoid nephrotoxic agents and hypotension. Follow-up with your primary care provider.   Non-bleeding gastric ulcer with a clean ulcer base (Forrest Class III). Biopsied. - Erythematous mucosa in the antrum. Biopsied. Seen on EGD done on 06/18/2021 by [facility] Continue p.o. Protonix 40 mg twice daily x60 days then Protonix 40 mg daily while on Eliquis.   Severe iron deficiency, likely secondary to gastric ulcer Iron studies  done on 06/15/2021 Received IV Feraheme x1 dose on 06/16/21 Continue oral ferrous sulfate daily. Follow-up with your primary care provider   Essential hypertension BP at goal, normotensive 118/61. Hold off home Norvasc and home Entresto to avoid hypotension. Currently on Coreg 6.25 mg twice daily, losartan 100 mg daily, 20 mg p.o. Lasix daily PRN, amiodarone 200 mg p.o. daily. Follow-up with your cardiologist within a week   Paroxysmal atrial fibrillation ([place]) Rate controlled -Continue amiodarone, Coreg Continue home Eliquis for CVA prevention.   Chronic heart failure with preserved ejection fraction (HFpEF) ([place]) Last EF 55-60% by TTE 04/20/2021.  BNP greater than 300 Net I&O -3.1 L  Generalized weakness/physical debility Secondary to symptomatic anemia and chronic osteoarthritis with deconditioning. PT OT consulted and recommended home health PT OT. Continue to mobilize as tolerated with fall precautions   RLS (restless legs syndrome) Continue home Requip.   Hypothyroidism Continue home Synthroid.   Hyperlipidemia Continue home regimen.   OSA (obstructive sleep apnea) with CO2 retention History of OSA intolerant to CPAP.  Has not been wearing oxygen at night regularly. -Continue supplemental oxygen nightly and as needed during the day Management per pulmonary, she will need to follow-up with pulmonary outpatient.  PCO2 62 with pH of 7.41 on 06/19/2021     DVT prophylaxis: Eliquis. Code Status: DNR, confirmed with patient on admission. Family Communication: None at bedside.     Disposition Plan: We will discharge to home once pulmonary signs off.   Consults called: GI, cardiology, pulmonary.     Discharge Exam: BP (!) 136/44 (BP Location: Left Arm)    Pulse (!) 57    Temp 98 F (36.7 C)    Resp 20    Ht '5\' 3"'$  (1.6 m)    Wt 96.8 kg    SpO2 97%    BMI 37.80 kg/m  General: 88 y.o. year-old female well developed well nourished in no acute distress.  Alert and oriented  x3. Cardiovascular: Regular rate and rhythm with no rubs or gallops.  No thyromegaly or JVD noted.   Respiratory: Clear to auscultation with no wheezes or rales. Good inspiratory effort. Abdomen: Soft nontender nondistended with normal bowel sounds x4 quadrants. Musculoskeletal: Trace lower extremity edema bilaterally. Psychiatry: Mood is appropriate for condition and setting  Discharge Instructions You were cared for by a hospitalist during your hospital stay. If you have any questions about your discharge medications or the care you received while you were in the hospital after you are discharged, you can call the unit and asked to speak with the hospitalist on call if the hospitalist that took care of you is not available. Once you are discharged, your primary care physician will handle any further medical issues. Please note that NO REFILLS for any discharge medications will be authorized once you are discharged, as it is imperative that you return to your primary care physician (or establish a relationship with a primary care  physician if you do not have one) for your aftercare needs so that they can reassess your need for medications and monitor your lab values.   Allergies as of 06/21/2021       Reactions   Statins Other (See Comments)   Muscle aches and INTERNAL BLEEDING   Atorvastatin    Other reaction(s): Myalgias   Gabapentin Other (See Comments)   "Made me loopy"   Methocarbamol Other (See Comments)   "Made me loopy"        Medication List     STOP taking these medications    amLODipine 10 MG tablet Commonly known as: NORVASC   isosorbide-hydrALAZINE 20-37.5 MG tablet Commonly known as: BIDIL   omeprazole 20 MG capsule Commonly known as: PRILOSEC       TAKE these medications    albuterol 108 (90 Base) MCG/ACT inhaler Commonly known as: VENTOLIN HFA Inhale 2 puffs into the lungs every 6 (six) hours as needed for wheezing or shortness of breath. What changed:  Another medication with the same name was removed. Continue taking this medication, and follow the directions you see here.   amiodarone 100 MG tablet Commonly known as: PACERONE Take 2 tablets (200 mg total) by mouth daily.   amoxicillin-clavulanate 875-125 MG tablet Commonly known as: Augmentin Take 1 tablet by mouth every 12 (twelve) hours for 5 days.   apixaban 2.5 MG Tabs tablet Commonly known as: ELIQUIS Take 1 tablet (2.5 mg total) by mouth 2 (two) times daily.   carvedilol 6.25 MG tablet Commonly known as: COREG Take 1 tablet (6.25 mg total) by mouth 2 (two) times daily with a meal for 14 days. What changed:  medication strength See the new instructions.   diclofenac sodium 1 % Gel Commonly known as: VOLTAREN Apply 2.25 g topically 3 (three) times daily as needed (for pain).   ferrous sulfate 325 (65 FE) MG tablet Take 1 tablet (325 mg total) by mouth daily with breakfast.   furosemide 20 MG tablet Commonly known as: LASIX Take 1 tablet (20 mg total) by mouth daily as needed for fluid or edema. Take Lasix 20 mg once daily with an additional dose as needed for fluid overload. What changed:  when to take this additional instructions   HYDROcodone-acetaminophen 7.5-325 MG tablet Commonly known as: NORCO Take 1 tablet by mouth every 6 (six) hours as needed for moderate pain.   ICAPS AREDS 2 PO Take 1 capsule by mouth 2 (two) times daily.   latanoprost 0.005 % ophthalmic solution Commonly known as: XALATAN Place 1 drop into both eyes at bedtime.   levothyroxine 125 MCG tablet Commonly known as: SYNTHROID Take 125 mcg by mouth daily before breakfast.   losartan 100 MG tablet Commonly known as: COZAAR TAKE ONE TABLET BY MOUTH AT NOON What changed: See the new instructions.   Nexlizet 180-10 MG Tabs Generic drug: Bempedoic Acid-Ezetimibe Take 1 capsule by mouth daily. What changed: additional instructions   pantoprazole 40 MG tablet Commonly known as:  PROTONIX Take 1 tablet (40 mg total) by mouth 2 (two) times daily.   pantoprazole 40 MG tablet Commonly known as: Protonix Take 1 tablet (40 mg total) by mouth daily. Take Protonix 40 mg daily after 08/20/2021 when on Eliquis. Start taking on: Aug 21, 2021   rOPINIRole 1 MG tablet Commonly known as: REQUIP Take 1 mg by mouth See admin instructions. Take 1 mg by mouth once a day between 3 PM-4 PM and an additional 1 mg in the  evening, if no relief   Vitamin D3 125 MCG (5000 UT) Caps Take 5,000 Units by mouth at bedtime.               Durable Medical Equipment  (From admission, onward)           Start     Ordered   06/18/21 1401  For home use only DME oxygen  Once       Question Answer Comment  Length of Need 6 Months   Mode or (Route) Nasal cannula   Liters per Minute 3   Frequency Continuous (stationary and portable oxygen unit needed)   Oxygen conserving device Yes   Oxygen delivery system Gas      06/18/21 1401           Allergies  Allergen Reactions   Statins Other (See Comments)    Muscle aches and INTERNAL BLEEDING   Atorvastatin     Other reaction(s): Myalgias   Gabapentin Other (See Comments)    "Made me loopy"   Methocarbamol Other (See Comments)    "Made me loopy"    Follow-up Information     [facility] [place] Follow up.   Specialty: Home Health Services Why: Alvis Lemmings will call you to schedule a visit.HH physical therapy Contact information: [address] [place] [address] [phone]         [place], Anderson Malta C, PA-C. Go on 06/28/2021.   Specialty: Cardiology Why: Appointment at 1:00 PM. Contact information: [place] [address] [phone]         [name], MD. Call today.   Specialty: Internal Medicine Why: Please call for a posthospital follow-up appointment. Contact information: [address] [place] [address] [place] [facility]  Follow up.   Why: oxygen continuous Contact information: [place] [address] [phone]         Margaretha Seeds, MD Follow up on 06/29/2021.   Specialty: Pulmonary Disease Why: Hospital Follow Up / Pulmonary - chronic respiratory failure. Appt at 3:30 PM. Please arrive at 3:15 for check in. Contact information: [place] [address] [phone]                  The results of significant diagnostics from this hospitalization (including imaging, microbiology, ancillary and laboratory) are listed below for reference.    Significant Diagnostic Studies: DG Chest 2 View  Result Date: 06/15/2021 CLINICAL DATA:  Shortness of breath. EXAM: CHEST - 2 VIEW COMPARISON:  December 20, 2019. FINDINGS: Stable cardiomegaly. Minimal bibasilar subsegmental atelectasis is noted. Bony thorax is unremarkable. IMPRESSION: Minimal bibasilar subsegmental atelectasis.  Mild cardiomegaly. Electronically Signed   By: Marijo Conception M.D.   On: 06/15/2021 17:20   CT CHEST WO CONTRAST  Result Date: 06/18/2021 CLINICAL DATA:  Chronic dyspnea EXAM: CT CHEST WITHOUT CONTRAST TECHNIQUE: Multidetector CT imaging of the chest was performed following the standard protocol without IV contrast. RADIATION DOSE REDUCTION: This exam was performed according to the departmental dose-optimization program which includes automated exposure control, adjustment of the mA and/or kV according to patient size and/or use of iterative reconstruction technique. COMPARISON:  CT chest dated March 17, 2016. Chest radiograph dated June 18, 2021. FINDINGS: Cardiovascular: The heart is mildly enlarged. Scattered coronary artery atherosclerotic calcifications. No pericardial effusion. Mitral annular calcifications. Aorta is normal in caliber with atherosclerotic  calcifications. Main pulmonary trunk is within upper normal limits. Mediastinum/Nodes: No enlarged mediastinal or axillary  lymph nodes. Thyroid gland, trachea, and esophagus demonstrate no significant findings. Lungs/Pleura: Patchy and ground-glass opacities in the right upper and lower lobes. Right basilar atelectasis. Left lung is clear. Chronic scarring/atelectasis of the right upper lobe. Upper Abdomen: Increased density of the liver. Moderate fatty infiltration of the pancreas. No acute abnormality. Musculoskeletal: Multilevel degenerate disc disease of the cervical spine with mild kyphosis. IMPRESSION: 1. Patchy and ground-glass opacities in the posterior aspect right upper and lower lobes concerning for multifocal pneumonia. Follow-up examination to resolution is recommended. 2.  Heart is enlarged.  No pericardial effusion. 3. Increase in AP diameter of the chest, likely secondary to chronic obstructive lung disease. 4. Degenerate disc disease of the thoracic spine. No acute osseous abnormality. 5.  Moderate fatty infiltration of the pancreas. 6. Increased density of the liver, correlate with history of amiodarone intake. Aortic Atherosclerosis (ICD10-I70.0). Electronically Signed   By: Keane Police D.O.   On: 06/18/2021 16:04   MR LUMBAR SPINE WO CONTRAST  Result Date: 05/31/2021 CLINICAL DATA:  Right hip sciatic pain, worsening EXAM: MRI LUMBAR SPINE WITHOUT CONTRAST TECHNIQUE: Multiplanar, multisequence MR imaging of the lumbar spine was performed. No intravenous contrast was administered. COMPARISON:  Lumbar spine radiographs 09/14/2018, CT abdomen/pelvis 04/14/2020 FINDINGS: Segmentation: Standard; the lowest formed disc space is designated L5-S1 Alignment: There is grade 1 anterolisthesis of L4 on L5, likely degenerative in nature. Alignment is otherwise normal. Vertebrae: Vertebral body heights are preserved. There is minimal degenerative endplate marrow signal abnormality anteriorly at T12-L1. There is no suspicious marrow signal abnormality. Conus medullaris and cauda equina: Conus extends to the T12-L1 level. Conus  and cauda equina appear normal. Paraspinal and other soft tissues: An infrarenal abdominal aortic aneurysm is noted, incompletely imaged but measuring up to approximately 5.1 cm. An ovoid focus of signal abnormality in the subcutaneous fat overlying the left sacral ala corresponds to dystrophic calcification seen on prior CT. The paraspinal soft tissues are unremarkable. Disc levels: There is mild disc desiccation and narrowing at T12-L1. There is mild desiccation without significant loss of height at the remaining levels. There is multilevel facet arthropathy, most advanced at L4-L5 and L5-S1. T12-L1: There is a minimal disc bulge without significant spinal canal or neural foraminal stenosis L1-L2: Mild facet arthropathy with no significant spinal canal or neural foraminal stenosis. L2-L3: There is a mild disc bulge and mild bilateral facet arthropathy resulting in mild narrowing of the left subarticular zone without evidence of nerve root impingement and no significant spinal canal or neural foraminal stenosis. L3-L4: There is a mild disc bulge, ligamentum flavum thickening, and bilateral facet arthropathy resulting in mild right and no significant left neural foraminal stenosis and no significant spinal canal stenosis. L4-L5: There is trace grade 1 anterolisthesis with a mild superimposed bulge, ligamentum flavum thickening, and bilateral facet arthropathy resulting in mild narrowing of the right subarticular zone without evidence of nerve root impingement and no significant neural foraminal stenosis L5-S1: Bilateral facet arthropathy without significant spinal canal or neural foraminal stenosis. IMPRESSION: 1. Multilevel facet arthropathy, most advanced at L4-L5 and L5-S1. 2. Otherwise, overall mild for age degenerative changes throughout the lumbar spine. There is mild subarticular zone narrowing on the left at L2-L3 and on the right at L4-L5 without evidence of nerve root impingement. No significant spinal  canal or neural foraminal stenosis, and no evidence of nerve root impingement to explain the patient's symptoms. 3.  Infrarenal abdominal aortic aneurysm measuring at least 5.1 cm. This is incompletely imaged but was evaluated on a CTA abdomen/pelvis from 04/14/2020. Electronically Signed   By: Valetta Mole M.D.   On: 05/31/2021 08:44   DG Swallowing Func-Speech Pathology  Result Date: 06/21/2021 Table formatting from the original result was not included. Objective Swallowing Evaluation: Type of Study: MBS-Modified Barium Swallow Study  Patient Details Name: Lisa Mcclure MRN: [id number] Date of Birth: Jun 11, 1932 Today's Date: 06/21/2021 Time: SLP Start Time (ACUTE ONLY): 23 -SLP Stop Time (ACUTE ONLY): 1100 SLP Time Calculation (min) (ACUTE ONLY): 20 min Past Medical History: Past Medical History: Diagnosis Date  Anemia   h/o hemorrhoidal bleeding and blood transfusion  Cardiomegaly   Chronic diastolic CHF (congestive heart failure) (HCC)   Depression   Diverticulosis   DOE (dyspnea on exertion)   Esophageal reflux   GERD (gastroesophageal reflux disease)   Hypothyroidism   Insomnia   LBP (low back pain)   OAB (overactive bladder)   Obesity   OSA (obstructive sleep apnea)   Osteoarthritis   Paroxysmal atrial fibrillation (HCC)   Rheumatoid arthritis(714.0)   Shoulder pain, bilateral   Unspecified essential hypertension   Venous insufficiency  Past Surgical History: Past Surgical History: Procedure Laterality Date  ABDOMINAL AORTIC ENDOVASCULAR STENT GRAFT N/A 12/20/2019  Procedure: Aortogram including catheter selection of aorta and bilateral iliac arteriogram, Endovascular repair of infrarenal abdominal aortic aneurysm with bifurcated stent graft (26 mm x 14 x 12 main body, right bell bottom with a 20 mm x 10 cm piece, and left bell bottom with a 16 mm x 12 cm piece) ;  Surgeon: Marty Heck, MD;  Location: [facility];  Service: Vascular;  Laterality: N/A;  APPENDECTOMY  1953  bladder abduction-1996  1996   breast biopsy Right 1980  CARDIOVERSION N/A 12/24/2019  Procedure: CARDIOVERSION;  Surgeon: Adrian Prows, MD;  Location: [facility] ENDOSCOPY;  Service: Cardiovascular;  Laterality: N/A;  [place] N/A 04/10/2015  Procedure: FLEXIBLE SIGMOIDOSCOPY;  Surgeon: Arta Silence, MD;  Location: [facility] ENDOSCOPY;  Service: Endoscopy;  Laterality: N/A;  knee arthroscopy Right 1996, 2010  KNEE ARTHROSCOPY W/ AUTOGENOUS CARTILAGE IMPLANTATION (ACI) PROCEDURE Left 1994, 1995  REFRACTIVE SURGERY  01/2020  SHOULDER SURGERY  1990  TEE WITHOUT CARDIOVERSION N/A 12/24/2019  Procedure: TRANSESOPHAGEAL ECHOCARDIOGRAM (TEE);  Surgeon: Adrian Prows, MD;  Location: [facility];  Service: Cardiovascular;  Laterality: N/A;  [place] Bilateral 12/20/2019  Procedure: Ultrasound-guided access of bilateral common femoral arteries for delivery of endograft and percutaneous closure;  Surgeon: Marty Heck, MD;  Location[facility] OR;  Service: Vascular;  Laterality: Bilateral;  VESICOVAGINAL FISTULA CLOSURE W/ TAH    WRIST SURGERY  6567 HPI: 88 year old woman who presented to [facility] ED with worsening dyspnea (noticed over the last 6 months). HFpEF (Echo 1/10 with EF 55-60%), PAF (on Eliquis), chronic anemia, OSA (does not tolerate CPAP, wears 2LNC), mild restrictive lung disease (based on PFTs 2017) with question of small airway disease, GERD, diverticulosis, CKD stage IIIb, stress incontinence, former smoker. She has had nonspecific chronic cough and congestion for several months and per patient this feels reminiscent of her symptoms in 2012 and 2017 during which she underwent more extensive pulmonary workup. CT chest with scattered lower lobe infiltrates consistent with viral and may be aspiration and also fatty infiltration consistent with amiodarone intake.Marland Kitchen  Subjective: pleasant, understanding of  reasoning for this test  Recommendations for follow up therapy are one component of a multi-disciplinary discharge planning process, led by the attending physician.  Recommendations may be updated based on patient status, additional functional criteria and insurance authorization. Assessment / Plan / Recommendation Clinical Impressions 06/21/2021 Clinical Impression Patient presents with normal oral phase and a mildly impaired pharyngeal phase of swallow as per this MBS. She exhibited swallow initiation delay to level of vallecular sinus with puree and regular texture solids and barium tablet became briefly lodged in vallecular sinus when taken with thin liquid barium. Post initial swallows, patient had trace vallecular and pyriform sinus residuals with thin liquids, trace to mild vallecular residuals with puree solids and mild vallecular and posterior pharyngeal wall residuals with regular texture solids.2-3 extra sips of thin liquid barium helped to fully transit barium tablet and a single sip of thin liquid barium helped fully transit regular texture residuals. Cervical esophageal phase of swallow appeared [facility]. No penetration or aspiration occured with any of the tested barium consistencies. SLP provided patient with recommendations to drink sips of liquids after every few bites of solid foods and to drink some water prior to PO's to help lubricate oropharyngeal tissues. Patient reported that she rarely drinks liquids with her meals. No further SLP intervention needed at this time. SLP Visit Diagnosis Dysphagia, oropharyngeal phase (R13.12) Attention and concentration deficit following -- Frontal lobe and executive function deficit following -- Impact on safety and function No limitations;Mild aspiration risk   Treatment Recommendations 06/21/2021 Treatment Recommendations No treatment recommended at this time   Prognosis 06/21/2021 Prognosis for Safe Diet Advancement Good Barriers to Reach Goals --  Barriers/Prognosis Comment -- Diet Recommendations 06/21/2021 SLP Diet Recommendations Regular solids;Thin liquid Liquid Administration via Cup;Straw Medication Administration Whole meds with liquid Compensations Small sips/bites;Slow rate Postural Changes Seated upright at 90 degrees   Other Recommendations 06/21/2021 Recommended Consults -- Oral Care Recommendations Oral care BID;Patient independent with oral care Other Recommendations -- Follow Up Recommendations No SLP follow up Assistance recommended at discharge None Functional Status Assessment Patient has had a recent decline in their functional status and demonstrates the ability to make significant improvements in function in a reasonable and predictable amount of time. No flowsheet data found.  Oral Phase 06/21/2021 Oral Phase WFL Oral - Pudding Teaspoon -- Oral - Pudding Cup -- Oral - Honey Teaspoon -- Oral - Honey Cup -- Oral - Nectar Teaspoon -- Oral - Nectar Cup -- Oral - Nectar Straw -- Oral - Thin Teaspoon -- Oral - Thin Cup -- Oral - Thin Straw -- Oral - Puree -- Oral - Mech Soft -- Oral - Regular -- Oral - Multi-Consistency -- Oral - Pill -- Oral Phase - Comment --  Pharyngeal Phase 06/21/2021 Pharyngeal Phase Impaired Pharyngeal- Pudding Teaspoon -- Pharyngeal -- Pharyngeal- Pudding Cup -- Pharyngeal -- Pharyngeal- Honey Teaspoon -- Pharyngeal -- Pharyngeal- Honey Cup -- Pharyngeal -- Pharyngeal- Nectar Teaspoon -- Pharyngeal -- Pharyngeal- Nectar Cup -- Pharyngeal -- Pharyngeal- Nectar Straw -- Pharyngeal -- Pharyngeal- Thin Teaspoon -- Pharyngeal -- Pharyngeal- Thin Cup Pharyngeal residue - valleculae;Pharyngeal residue - pyriform Pharyngeal -- Pharyngeal- Thin Straw Pharyngeal residue - valleculae;Pharyngeal residue - pyriform Pharyngeal -- Pharyngeal- Puree Delayed swallow initiation-vallecula;Pharyngeal residue - valleculae Pharyngeal -- Pharyngeal- Mechanical Soft -- Pharyngeal -- Pharyngeal- Regular Delayed swallow  initiation-vallecula;Pharyngeal residue - valleculae;Pharyngeal residue - posterior pharnyx Pharyngeal -- Pharyngeal- Multi-consistency -- Pharyngeal -- Pharyngeal- Pill Delayed swallow initiation-vallecula Pharyngeal -- Pharyngeal Comment --  Cervical  Esophageal Phase  06/21/2021 Cervical Esophageal Phase WFL Pudding Teaspoon -- Pudding Cup -- Honey Teaspoon -- Honey Cup -- Nectar Teaspoon -- Nectar Cup -- Nectar Straw -- Thin Teaspoon -- Thin Cup -- Thin Straw -- Puree -- Mechanical Soft -- Regular -- Multi-consistency -- Pill -- Cervical Esophageal Comment -- Sonia Baller, MA, CCC-SLP Speech Therapy                     XR Pelvis 1-2 Views  Result Date: 05/26/2021 An AP pelvis shows moderate severe arthritis of the right hip and moderate arthritis of the left hip.   Microbiology: Recent Results (from the past 240 hour(s))  Resp Panel by RT-PCR (Flu A&B, Covid) Nasopharyngeal Swab     Status: None   Collection Time: 06/15/21  5:16 PM   Specimen: Nasopharyngeal Swab; Nasopharyngeal(NP) swabs in vial transport medium  Result Value Ref Range Status   SARS Coronavirus 2 by RT PCR NEGATIVE NEGATIVE Final    Comment: (NOTE) SARS-CoV-2 target nucleic acids are NOT DETECTED.  The SARS-CoV-2 RNA is generally detectable in upper respiratory specimens during the acute phase of infection. The lowest concentration of SARS-CoV-2 viral copies this assay can detect is 138 copies/mL. A negative result does not preclude SARS-Cov-2 infection and should not be used as the sole basis for treatment or other patient management decisions. A negative result may occur with  improper specimen collection/handling, submission of specimen other than nasopharyngeal swab, presence of viral mutation(s) within the areas targeted by this assay, and inadequate number of viral copies(<138 copies/mL). A negative result must be combined with clinical observations, patient history, and epidemiological information. The  expected result is Negative.  Fact Sheet for Patients:  EntrepreneurPulse.com.au  Fact Sheet for Healthcare Providers:  IncredibleEmployment.be  This test is no t yet approved or cleared by the [place] FDA and  has been authorized for detection and/or diagnosis of SARS-CoV-2 by FDA under an Emergency Use Authorization (EUA). This EUA will remain  in effect (meaning this test can be used) for the duration of the COVID-19 declaration under Section 564(b)(1) of the Act, 21 U.S.C.section 360bbb-3(b)(1), unless the authorization is terminated  or revoked sooner.       Influenza A by PCR NEGATIVE NEGATIVE Final   Influenza B by PCR NEGATIVE NEGATIVE Final    Comment: (NOTE) The Xpert Xpress SARS-CoV-2/FLU/RSV plus assay is intended as an aid in the diagnosis of influenza from Nasopharyngeal swab specimens and should not be used as a sole basis for treatment. Nasal washings and aspirates are unacceptable for Xpert Xpress SARS-CoV-2/FLU/RSV testing.  Fact Sheet for Patients: EntrepreneurPulse.com.au  Fact Sheet for Healthcare Providers: IncredibleEmployment.be  This test is not yet approved or cleared by the [place] FDA and has been authorized for detection and/or diagnosis of SARS-CoV-2 by FDA under an Emergency Use Authorization (EUA). This EUA will remain in effect (meaning this test can be used) for the duration of the COVID-19 declaration under Section 564(b)(1) of the Act, 21 U.S.C. section 360bbb-3(b)(1), unless the authorization is terminated or revoked.  Performed at [facility], [place] [address] [place], [place] [address]   Respiratory (~20 pathogens) panel by PCR     Status: None   Collection Time: 06/18/21  7:11 PM   Specimen: Nasopharyngeal Swab; Respiratory  Result Value Ref Range Status   Adenovirus NOT DETECTED NOT DETECTED Final   Coronavirus 229E NOT DETECTED  NOT DETECTED Final    Comment: (NOTE) The Coronavirus  on the Respiratory Panel, DOES NOT test for the novel  Coronavirus (2019 nCoV)    Coronavirus HKU1 NOT DETECTED NOT DETECTED Final   Coronavirus NL63 NOT DETECTED NOT DETECTED Final   Coronavirus OC43 NOT DETECTED NOT DETECTED Final   Metapneumovirus NOT DETECTED NOT DETECTED Final   Rhinovirus / Enterovirus NOT DETECTED NOT DETECTED Final   Influenza A NOT DETECTED NOT DETECTED Final   Influenza B NOT DETECTED NOT DETECTED Final   Parainfluenza Virus 1 NOT DETECTED NOT DETECTED Final   Parainfluenza Virus 2 NOT DETECTED NOT DETECTED Final   Parainfluenza Virus 3 NOT DETECTED NOT DETECTED Final   Parainfluenza Virus 4 NOT DETECTED NOT DETECTED Final   Respiratory Syncytial Virus NOT DETECTED NOT DETECTED Final   Bordetella pertussis NOT DETECTED NOT DETECTED Final   Bordetella Parapertussis NOT DETECTED NOT DETECTED Final   Chlamydophila pneumoniae NOT DETECTED NOT DETECTED Final   Mycoplasma pneumoniae NOT DETECTED NOT DETECTED Final    Comment: Performed at [facility], [place] [address] [place], [place] [address]  Expectorated Sputum Assessment w Gram Stain, Rflx to Resp Cult     Status: None   Collection Time: 06/19/21  9:44 AM   Specimen: Expectorated Sputum  Result Value Ref Range Status   Specimen Description EXPECTORATED SPUTUM  Final   Special Requests NONE  Final   Sputum evaluation   Final    Sputum specimen not acceptable for testing.  Please recollect.   Performed at [facility], [place] [address] [place], [place] [address]    Report Status 06/19/2021 FINAL  Final  Expectorated Sputum Assessment w Gram Stain, Rflx to Resp Cult     Status: None   Collection Time: 06/19/21  6:10 PM   Specimen: Expectorated Sputum  Result Value Ref Range Status   Specimen Description EXPECTORATED SPUTUM  Final   Special Requests NONE  Final   Sputum evaluation   Final    THIS SPECIMEN IS ACCEPTABLE FOR  SPUTUM CULTURE Performed at [facility], [place] [address], [place], [place] [address]    Report Status 06/20/2021 FINAL  Final  Culture, Respiratory w Gram Stain     Status: None (Preliminary result)   Collection Time: 06/19/21  6:10 PM  Result Value Ref Range Status   Specimen Description   Final    EXPECTORATED SPUTUM Performed at [place] [address], [place], [place] [address]    Special Requests   Final    NONE Reflexed from [id number] Performed at [facility], [place] [address], [place], [place] [address]    Gram Stain   Final    FEW SQUAMOUS EPITHELIAL CELLS PRESENT FEW WBC SEEN FEW GRAM POSITIVE RODS FEW GRAM NEGATIVE RODS FEW GRAM POSITIVE COCCI    Culture   Final    Normal respiratory flora-no Staph aureus or Pseudomonas seen Performed at [facility], [address], [place], [place] [address]    Report Status PENDING  Incomplete     Labs: Basic Metabolic Panel: Recent Labs  Lab 06/15/21 1712 06/16/21 0644 06/20/21 0524 06/20/21 0536  NA 137 136  --  138  K 4.2 4.0  --  4.3  CL 93* 95*  --  93*  CO2 33* 32  --  37*  GLUCOSE 98 240*  --  100*  BUN 52* 50*  --  36*  CREATININE 2.00* 1.59*  --  1.28*  CALCIUM 8.7* 8.7*  --  8.5*  MG  --   --  2.2  --   PHOS  --   --  4.2  --    Liver Function Tests: No results for input(s): AST, ALT, ALKPHOS, BILITOT, PROT, ALBUMIN in the last 168 hours. No results for input(s): LIPASE, AMYLASE in the last 168 hours. No results for input(s): AMMONIA in the last 168 hours. CBC: Recent Labs  Lab 06/15/21 1712 06/16/21 0644 06/17/21 0525 06/18/21 0438 06/20/21 0524 06/21/21 0447  WBC 7.3 5.2 8.5 8.7 7.4 7.6  NEUTROABS 5.3  --   --   --   --   --   HGB 7.4* 8.3* 8.8* 8.5* 8.7* 8.7*  HCT 24.8* 26.6* 28.8* 28.0* 29.6* 29.4*  MCV 88.6 86.1 87.3 89.5 91.4 90.5  PLT 392 403* 400 367 377 376   Cardiac Enzymes: No results for input(s): CKTOTAL, CKMB,  CKMBINDEX, TROPONINI in the last 168 hours. BNP: BNP (last 3 results) Recent Labs    06/15/21 1718 06/19/21 0534 06/21/21 0447  BNP 344.1* 519.6* 353.0*    ProBNP (last 3 results) No results for input(s): PROBNP in the last 8760 hours.  CBG: Recent Labs  Lab 06/16/21 1751  GLUCAP 127*       Signed:  Kayleen Memos, MD [facility] 06/21/2021, 12:21 PM

## 2021-06-22 LAB — CULTURE, RESPIRATORY W GRAM STAIN: Culture: NORMAL

## 2021-06-22 LAB — SURGICAL PATHOLOGY

## 2021-06-22 LAB — LEGIONELLA PNEUMOPHILA SEROGP 1 UR AG: L. pneumophila Serogp 1 Ur Ag: NEGATIVE

## 2021-06-23 DIAGNOSIS — I480 Paroxysmal atrial fibrillation: Secondary | ICD-10-CM | POA: Diagnosis not present

## 2021-06-23 DIAGNOSIS — N189 Chronic kidney disease, unspecified: Secondary | ICD-10-CM | POA: Diagnosis not present

## 2021-06-23 DIAGNOSIS — G2581 Restless legs syndrome: Secondary | ICD-10-CM | POA: Diagnosis not present

## 2021-06-23 DIAGNOSIS — D509 Iron deficiency anemia, unspecified: Secondary | ICD-10-CM | POA: Diagnosis not present

## 2021-06-23 DIAGNOSIS — J9601 Acute respiratory failure with hypoxia: Secondary | ICD-10-CM | POA: Diagnosis not present

## 2021-06-23 DIAGNOSIS — K219 Gastro-esophageal reflux disease without esophagitis: Secondary | ICD-10-CM | POA: Diagnosis not present

## 2021-06-23 DIAGNOSIS — I5032 Chronic diastolic (congestive) heart failure: Secondary | ICD-10-CM | POA: Diagnosis not present

## 2021-06-23 DIAGNOSIS — R1313 Dysphagia, pharyngeal phase: Secondary | ICD-10-CM | POA: Diagnosis not present

## 2021-06-23 DIAGNOSIS — J9602 Acute respiratory failure with hypercapnia: Secondary | ICD-10-CM | POA: Diagnosis not present

## 2021-06-23 DIAGNOSIS — N1832 Chronic kidney disease, stage 3b: Secondary | ICD-10-CM | POA: Diagnosis not present

## 2021-06-23 DIAGNOSIS — K5790 Diverticulosis of intestine, part unspecified, without perforation or abscess without bleeding: Secondary | ICD-10-CM | POA: Diagnosis not present

## 2021-06-23 DIAGNOSIS — M25512 Pain in left shoulder: Secondary | ICD-10-CM | POA: Diagnosis not present

## 2021-06-23 DIAGNOSIS — M069 Rheumatoid arthritis, unspecified: Secondary | ICD-10-CM | POA: Diagnosis not present

## 2021-06-23 DIAGNOSIS — I714 Abdominal aortic aneurysm, without rupture, unspecified: Secondary | ICD-10-CM | POA: Diagnosis not present

## 2021-06-23 DIAGNOSIS — D631 Anemia in chronic kidney disease: Secondary | ICD-10-CM | POA: Diagnosis not present

## 2021-06-23 DIAGNOSIS — M25511 Pain in right shoulder: Secondary | ICD-10-CM | POA: Diagnosis not present

## 2021-06-23 DIAGNOSIS — J45909 Unspecified asthma, uncomplicated: Secondary | ICD-10-CM | POA: Diagnosis not present

## 2021-06-23 DIAGNOSIS — I872 Venous insufficiency (chronic) (peripheral): Secondary | ICD-10-CM | POA: Diagnosis not present

## 2021-06-23 DIAGNOSIS — I130 Hypertensive heart and chronic kidney disease with heart failure and stage 1 through stage 4 chronic kidney disease, or unspecified chronic kidney disease: Secondary | ICD-10-CM | POA: Diagnosis not present

## 2021-06-23 DIAGNOSIS — E039 Hypothyroidism, unspecified: Secondary | ICD-10-CM | POA: Diagnosis not present

## 2021-06-23 DIAGNOSIS — G4700 Insomnia, unspecified: Secondary | ICD-10-CM | POA: Diagnosis not present

## 2021-06-23 DIAGNOSIS — K259 Gastric ulcer, unspecified as acute or chronic, without hemorrhage or perforation: Secondary | ICD-10-CM | POA: Diagnosis not present

## 2021-06-23 DIAGNOSIS — M1990 Unspecified osteoarthritis, unspecified site: Secondary | ICD-10-CM | POA: Diagnosis not present

## 2021-06-23 DIAGNOSIS — G4733 Obstructive sleep apnea (adult) (pediatric): Secondary | ICD-10-CM | POA: Diagnosis not present

## 2021-06-23 DIAGNOSIS — N3281 Overactive bladder: Secondary | ICD-10-CM | POA: Diagnosis not present

## 2021-06-23 DIAGNOSIS — F32A Depression, unspecified: Secondary | ICD-10-CM | POA: Diagnosis not present

## 2021-06-24 DIAGNOSIS — J9602 Acute respiratory failure with hypercapnia: Secondary | ICD-10-CM | POA: Diagnosis not present

## 2021-06-24 DIAGNOSIS — J9601 Acute respiratory failure with hypoxia: Secondary | ICD-10-CM | POA: Diagnosis not present

## 2021-06-24 DIAGNOSIS — N1832 Chronic kidney disease, stage 3b: Secondary | ICD-10-CM | POA: Diagnosis not present

## 2021-06-24 DIAGNOSIS — I5032 Chronic diastolic (congestive) heart failure: Secondary | ICD-10-CM | POA: Diagnosis not present

## 2021-06-24 DIAGNOSIS — I130 Hypertensive heart and chronic kidney disease with heart failure and stage 1 through stage 4 chronic kidney disease, or unspecified chronic kidney disease: Secondary | ICD-10-CM | POA: Diagnosis not present

## 2021-06-24 DIAGNOSIS — D509 Iron deficiency anemia, unspecified: Secondary | ICD-10-CM | POA: Diagnosis not present

## 2021-06-25 ENCOUNTER — Ambulatory Visit: Payer: Medicare Other

## 2021-06-25 LAB — ACETYLCHOLINE RECEPTOR AB, ALL
Acety choline binding ab: <0.03 nmol/L (ref 0.00–0.24)
Acetylchol Block Ab: 17 % (ref 0–25)

## 2021-06-28 ENCOUNTER — Encounter: Payer: Self-pay | Admitting: Pulmonary Disease

## 2021-06-28 ENCOUNTER — Other Ambulatory Visit: Payer: Self-pay

## 2021-06-28 ENCOUNTER — Ambulatory Visit (INDEPENDENT_AMBULATORY_CARE_PROVIDER_SITE_OTHER): Payer: Medicare Other | Admitting: Pulmonary Disease

## 2021-06-28 ENCOUNTER — Ambulatory Visit: Payer: Medicare Other | Admitting: Student

## 2021-06-28 VITALS — BP 130/70 | HR 57 | Ht 63.0 in | Wt 209.2 lb

## 2021-06-28 DIAGNOSIS — J189 Pneumonia, unspecified organism: Secondary | ICD-10-CM

## 2021-06-28 DIAGNOSIS — J9611 Chronic respiratory failure with hypoxia: Secondary | ICD-10-CM | POA: Diagnosis not present

## 2021-06-28 NOTE — Patient Instructions (Signed)
Will arrange for a spacer device to use with albuterol ? ?Will have [facility] arrange for a portable oxygen concentrator ? ?Follow up in 4 weeks with Dr. Halford Chessman or a nurse practitioner, and you will need a chest xray at the time of your visit ?

## 2021-06-28 NOTE — Progress Notes (Signed)
? ?[facility] ? ?Chief Complaint  ?Patient presents with  ? Hospitalization Follow-up  ?  Chronic cough  ? ? ?Past Surgical History:  ?She  has a past surgical history that includes Appendectomy (1740); Vesicovaginal fistula closure w/ TAH; breast biopsy (Right, 1980); Knee arthroscopy w/ autogenous cartilage implantation procedure (Left, 1994, 1995); knee arthroscopy (Right, 1996, 2010); Shoulder surgery (1990); Cystocele repair; Cesarean section; Wrist surgery (8144); Total abdominal hysterectomy (1972); bladder abduction-1996 (1996); Cosmetic surgery (1996); Flexible sigmoidoscopy (N/A, 04/10/2015); Abdominal aortic endovascular stent graft (N/A, 12/20/2019); Ultrasound guidance for vascular access (Bilateral, 12/20/2019); TEE without cardioversion (N/A, 12/24/2019); Cardioversion (N/A, 12/24/2019); Refractive surgery (01/2020); Esophagogastroduodenoscopy (egd) with propofol (N/A, 06/18/2021); and biopsy (06/18/2021). ? ?Past Medical History:  ?Diastolic CHF, Depression, Diverticulosis, GERD, Hypothyroidism, Insomnia, Sciatica, Overactive bladder, OA, Paroxysmal atrial fibrillation, Rheumatoid arthritis, Hypertension, CKD 3b ? ?Constitutional:  ?BP 130/70 (BP Location: Right Arm, Cuff Size: Large)   Pulse (!) 57   Ht '5\' 3"'$  (1.6 m)   Wt 209 lb 3.2 oz (94.9 kg)   SpO2 98% Comment: 2L  BMI 37.06 kg/m?  ? ?Brief Summary:  ?Lisa Mcclure is a 88 y.o. female former smoker with chronic respiratory failure and atypical pneumonia.  She had BOOP in the setting of rheumatoid arthritis in 2010.  She has history of obstructive sleep apnea, but is intolerant of CPAP. ?  ? ? ? ?Subjective:  ? ?She is here with her daughter. ? ?She is slowly increasing her activity level since getting out of the hospital.  Has few more days of antibiotics.  Using 2 liters oxygen 24/7.  Sleeps with upper body elevated.  No having as much cough or phlegm.  Not having sinus congestion or reflux.  Back  pain is better.  Uses a walker.  Hard to get around with bigger oxygen tanks. ? ?Physical Exam:  ? ?Appearance - well kempt  ? ?ENMT - no sinus tenderness, no oral exudate, no LAN, Mallampati 3 airway, no stridor ? ?Respiratory - equal breath sounds bilaterally, no wheezing or rales ? ?CV - s1s2 regular rate and rhythm, no murmurs ? ?Ext - no clubbing, no edema ? ?Skin - no rashes ? ?Psych - normal mood and affect ?  ?Pulmonary testing:  ?PFT 02/11/16 >> FEV1 1.45 (79%), FEV1% 81, TLC 3.75 (76%), DLCO 65% ?ABG 06/19/21 >> pH 7.41, PCO2  62, PO2 82 ? ?Chest Imaging:  ?CT chest 06/18/21 >> patchy GGO in RUL and RLL, scarr RUL ? ?Sleep Tests:  ?PSG 08/13/19 >> AHI 48.9, SpO2 low 58% ? ?Cardiac Tests:  ?Echo 04/20/21 >> EF 55 to 60%, mod LVH, severe LA dilation, mod AS, mild MR, mild TR, RVSP 42 mmHg ? ?Social History:  ?She  reports that she quit smoking about 37 years ago. Her smoking use included cigarettes. She has a 2.00 pack-year smoking history. She has never used smokeless tobacco. She reports current alcohol use. She reports that she does not use drugs. ? ?Family History:  ?Her family history includes Breast cancer in her maternal aunt; COPD in her father; Heart attack (age of onset: 52) in her maternal grandmother; Lung cancer in her mother and son. ?  ? ? ?Assessment/Plan:  ? ?Chronic hypoxic, hypercapnic respiratory failure. ?- from restrictive lung disease in setting of obesity and kyphosis, sleep disordered breathing, opiate medication use, and made worse by recent pneumonia ?- continue two liters oxygen; goal SpO2 > 90% ?- will see if [employer] can arrange for a  POC at 2 liters pulsed ?- will reassess daytime oxygen needs at next follow up ? ?Chronic cough. ?- from post nasal drip, reflux, and recent pneumonia ?- prn flonase ?- continue protonix ?- prn albuterol, and will arrange for spacer device ? ?Sleep disordered breathing. ?- intolerant of PAP therapy ?- 2 liters oxygen at night ?- she sleeps in adjustable  bed with upper body elevated ? ?Atypical pneumonia. ?- concern for recurrent silent aspiration versus lipoid pneumonia from chronic Vicks vapor rub use ?- clinically improved from recent hospitalization ?- will need f/u CXR at time of next visit  ? ?Gastric ulcer, reflux. ?- will need follow up with [facility] gastroenterology ? ?Paroxysmal atrial fibrillation, Chronic diastolic CHF, Aortic stenosis. ?- followed by [facility] ? ?Sciatica, hip pain. ?- followed by Dr. Jean Rosenthal with [facility] ? ?Goals of care. ?- DNR/DNI ? ?Time Spent Involved in Patient Care on Day of Examination:  ?46 minutes ? ?Follow up:  ? ?Patient Instructions  ?Will arrange for a spacer device to use with albuterol ? ?Will have [place] arrange for a portable oxygen concentrator ? ?Follow up in 4 weeks with Dr. Halford Chessman or a nurse practitioner, and you will need a chest xray at the time of your visit ? ?Medication List:  ? ?Allergies as of 06/28/2021   ? ?   Reactions  ? Statins Other (See Comments)  ? Muscle aches and INTERNAL BLEEDING  ? Atorvastatin   ? Other reaction(s): Myalgias  ? Gabapentin Other (See Comments)  ? "Made me loopy"  ? Methocarbamol Other (See Comments)  ? "Made me loopy"  ? ?  ? ?  ?Medication List  ?  ? ?  ? Accurate as of June 28, 2021  1:00 PM. If you have any questions, ask your nurse or doctor.  ?  ?  ? ?  ? ?albuterol 108 (90 Base) MCG/ACT inhaler ?Commonly known as: VENTOLIN HFA ?Inhale 2 puffs into the lungs every 6 (six) hours as needed for wheezing or shortness of breath. ?  ?amiodarone 100 MG tablet ?Commonly known as: PACERONE ?Take 2 tablets (200 mg total) by mouth daily. ?  ?apixaban 2.5 MG Tabs tablet ?Commonly known as: ELIQUIS ?Take 1 tablet (2.5 mg total) by mouth 2 (two) times daily. ?  ?carvedilol 6.25 MG tablet ?Commonly known as: COREG ?Take 1 tablet (6.25 mg total) by mouth 2 (two) times daily with a meal for 14 days. ?  ?diclofenac sodium 1 % Gel ?Commonly known as: VOLTAREN ?Apply  2.25 g topically 3 (three) times daily as needed (for pain). ?  ?ferrous sulfate 325 (65 FE) MG tablet ?Take 1 tablet (325 mg total) by mouth daily with breakfast. ?  ?furosemide 20 MG tablet ?Commonly known as: LASIX ?Take 1 tablet (20 mg total) by mouth daily as needed for fluid or edema. Take Lasix 20 mg once daily with an additional dose as needed for fluid overload. ?What changed:  ?when to take this ?additional instructions ?  ?HYDROcodone-acetaminophen 7.5-325 MG tablet ?Commonly known as: NORCO ?Take 1 tablet by mouth every 6 (six) hours as needed for moderate pain. ?  ?ICAPS AREDS 2 PO ?Take 1 capsule by mouth 2 (two) times daily. ?  ?latanoprost 0.005 % ophthalmic solution ?Commonly known as: XALATAN ?Place 1 drop into both eyes at bedtime. ?  ?levothyroxine 125 MCG tablet ?Commonly known as: SYNTHROID ?Take 125 mcg by mouth daily before breakfast. ?  ?losartan 100 MG tablet ?Commonly known as: COZAAR ?TAKE  ONE TABLET BY MOUTH AT NOON ?What changed: See the new instructions. ?  ?Nexlizet 180-10 MG Tabs ?Generic drug: Bempedoic Acid-Ezetimibe ?Take 1 capsule by mouth daily. ?What changed: additional instructions ?  ?pantoprazole 40 MG tablet ?Commonly known as: PROTONIX ?Take 1 tablet (40 mg total) by mouth 2 (two) times daily. ?  ?pantoprazole 40 MG tablet ?Commonly known as: Protonix ?Take 1 tablet (40 mg total) by mouth daily. Take Protonix 40 mg daily after 08/20/2021 when on Eliquis. ?Start taking on: Aug 21, 2021 ?  ?rOPINIRole 1 MG tablet ?Commonly known as: REQUIP ?Take 1 mg by mouth See admin instructions. Take 1 mg by mouth once a day between 3 PM-4 PM and an additional 1 mg in the evening, if no relief ?  ?Vitamin D3 125 MCG (5000 UT) Caps ?Take 5,000 Units by mouth at bedtime. ?  ? ?  ? ? ?Signature:  ?Chesley Mires, MD ?[place] ?Pager - [phone] - [phone] ?06/28/2021, 1:00 PM ?  ? ? ? ? ? ? ? ? ?

## 2021-06-29 ENCOUNTER — Telehealth (HOSPITAL_COMMUNITY): Payer: Self-pay

## 2021-06-29 ENCOUNTER — Inpatient Hospital Stay: Payer: Medicare Other | Admitting: Pulmonary Disease

## 2021-06-29 DIAGNOSIS — I350 Nonrheumatic aortic (valve) stenosis: Secondary | ICD-10-CM | POA: Diagnosis not present

## 2021-06-29 DIAGNOSIS — Z79899 Other long term (current) drug therapy: Secondary | ICD-10-CM | POA: Diagnosis not present

## 2021-06-29 DIAGNOSIS — E785 Hyperlipidemia, unspecified: Secondary | ICD-10-CM | POA: Diagnosis not present

## 2021-06-29 DIAGNOSIS — I10 Essential (primary) hypertension: Secondary | ICD-10-CM | POA: Diagnosis not present

## 2021-06-29 DIAGNOSIS — K219 Gastro-esophageal reflux disease without esophagitis: Secondary | ICD-10-CM | POA: Diagnosis not present

## 2021-06-29 DIAGNOSIS — I130 Hypertensive heart and chronic kidney disease with heart failure and stage 1 through stage 4 chronic kidney disease, or unspecified chronic kidney disease: Secondary | ICD-10-CM | POA: Diagnosis not present

## 2021-06-29 DIAGNOSIS — I4891 Unspecified atrial fibrillation: Secondary | ICD-10-CM | POA: Diagnosis not present

## 2021-06-29 DIAGNOSIS — N1832 Chronic kidney disease, stage 3b: Secondary | ICD-10-CM | POA: Diagnosis not present

## 2021-06-29 DIAGNOSIS — J441 Chronic obstructive pulmonary disease with (acute) exacerbation: Secondary | ICD-10-CM | POA: Diagnosis not present

## 2021-06-29 DIAGNOSIS — J9602 Acute respiratory failure with hypercapnia: Secondary | ICD-10-CM | POA: Diagnosis not present

## 2021-06-29 DIAGNOSIS — J9601 Acute respiratory failure with hypoxia: Secondary | ICD-10-CM | POA: Diagnosis not present

## 2021-06-29 DIAGNOSIS — D649 Anemia, unspecified: Secondary | ICD-10-CM | POA: Diagnosis not present

## 2021-06-29 DIAGNOSIS — I5032 Chronic diastolic (congestive) heart failure: Secondary | ICD-10-CM | POA: Diagnosis not present

## 2021-06-29 DIAGNOSIS — D509 Iron deficiency anemia, unspecified: Secondary | ICD-10-CM | POA: Diagnosis not present

## 2021-06-29 NOTE — Telephone Encounter (Signed)
Called and spoke with pt in regards to CR, pt stated she is not interested at this time.   Closed referral 

## 2021-06-30 DIAGNOSIS — J9602 Acute respiratory failure with hypercapnia: Secondary | ICD-10-CM | POA: Diagnosis not present

## 2021-06-30 DIAGNOSIS — D509 Iron deficiency anemia, unspecified: Secondary | ICD-10-CM | POA: Diagnosis not present

## 2021-06-30 DIAGNOSIS — I130 Hypertensive heart and chronic kidney disease with heart failure and stage 1 through stage 4 chronic kidney disease, or unspecified chronic kidney disease: Secondary | ICD-10-CM | POA: Diagnosis not present

## 2021-06-30 DIAGNOSIS — I5032 Chronic diastolic (congestive) heart failure: Secondary | ICD-10-CM | POA: Diagnosis not present

## 2021-06-30 DIAGNOSIS — J9601 Acute respiratory failure with hypoxia: Secondary | ICD-10-CM | POA: Diagnosis not present

## 2021-06-30 DIAGNOSIS — N1832 Chronic kidney disease, stage 3b: Secondary | ICD-10-CM | POA: Diagnosis not present

## 2021-07-01 DIAGNOSIS — H353114 Nonexudative age-related macular degeneration, right eye, advanced atrophic with subfoveal involvement: Secondary | ICD-10-CM | POA: Diagnosis not present

## 2021-07-01 DIAGNOSIS — H43393 Other vitreous opacities, bilateral: Secondary | ICD-10-CM | POA: Diagnosis not present

## 2021-07-01 DIAGNOSIS — H43813 Vitreous degeneration, bilateral: Secondary | ICD-10-CM | POA: Diagnosis not present

## 2021-07-01 DIAGNOSIS — H353221 Exudative age-related macular degeneration, left eye, with active choroidal neovascularization: Secondary | ICD-10-CM | POA: Diagnosis not present

## 2021-07-01 DIAGNOSIS — H35033 Hypertensive retinopathy, bilateral: Secondary | ICD-10-CM | POA: Diagnosis not present

## 2021-07-02 DIAGNOSIS — J9602 Acute respiratory failure with hypercapnia: Secondary | ICD-10-CM | POA: Diagnosis not present

## 2021-07-02 DIAGNOSIS — I130 Hypertensive heart and chronic kidney disease with heart failure and stage 1 through stage 4 chronic kidney disease, or unspecified chronic kidney disease: Secondary | ICD-10-CM | POA: Diagnosis not present

## 2021-07-02 DIAGNOSIS — I5032 Chronic diastolic (congestive) heart failure: Secondary | ICD-10-CM | POA: Diagnosis not present

## 2021-07-02 DIAGNOSIS — J9601 Acute respiratory failure with hypoxia: Secondary | ICD-10-CM | POA: Diagnosis not present

## 2021-07-02 DIAGNOSIS — D509 Iron deficiency anemia, unspecified: Secondary | ICD-10-CM | POA: Diagnosis not present

## 2021-07-02 DIAGNOSIS — N1832 Chronic kidney disease, stage 3b: Secondary | ICD-10-CM | POA: Diagnosis not present

## 2021-07-05 DIAGNOSIS — N1832 Chronic kidney disease, stage 3b: Secondary | ICD-10-CM | POA: Diagnosis not present

## 2021-07-05 DIAGNOSIS — D509 Iron deficiency anemia, unspecified: Secondary | ICD-10-CM | POA: Diagnosis not present

## 2021-07-05 DIAGNOSIS — J9602 Acute respiratory failure with hypercapnia: Secondary | ICD-10-CM | POA: Diagnosis not present

## 2021-07-05 DIAGNOSIS — J9601 Acute respiratory failure with hypoxia: Secondary | ICD-10-CM | POA: Diagnosis not present

## 2021-07-05 DIAGNOSIS — I130 Hypertensive heart and chronic kidney disease with heart failure and stage 1 through stage 4 chronic kidney disease, or unspecified chronic kidney disease: Secondary | ICD-10-CM | POA: Diagnosis not present

## 2021-07-05 DIAGNOSIS — I5032 Chronic diastolic (congestive) heart failure: Secondary | ICD-10-CM | POA: Diagnosis not present

## 2021-07-07 ENCOUNTER — Ambulatory Visit: Payer: Medicare Other | Admitting: Student

## 2021-07-07 ENCOUNTER — Other Ambulatory Visit: Payer: Self-pay | Admitting: Student

## 2021-07-07 ENCOUNTER — Encounter: Payer: Self-pay | Admitting: Student

## 2021-07-07 VITALS — BP 140/74 | HR 60 | Temp 98.0°F | Resp 17 | Ht 63.0 in | Wt 209.0 lb

## 2021-07-07 DIAGNOSIS — I5032 Chronic diastolic (congestive) heart failure: Secondary | ICD-10-CM | POA: Diagnosis not present

## 2021-07-07 DIAGNOSIS — I480 Paroxysmal atrial fibrillation: Secondary | ICD-10-CM | POA: Diagnosis not present

## 2021-07-07 DIAGNOSIS — I10 Essential (primary) hypertension: Secondary | ICD-10-CM

## 2021-07-07 NOTE — Progress Notes (Signed)
? ?Primary Physician/Referring:  [name], MD ? ?[name], female    DOB: 1932/05/01, 88 y.o.   MRN: [id number] ? ?Chief Complaint  ?Patient presents with  ? Atrial Fibrillation  ? ?HPI:   ? ?Lisa Mcclure  is a 88 y.o.  female  with past medical history of obesity, chronic diastolic CHF, hypertensive heart disease, paroxysmal atrial fibrillation, anemia, hemorrhoids, diverticulosis, esophageal reflux, depression, OSA intolerant to CPAP on nocturnal 02, rheumatoid arthritis, and venous insufficiency. CT scan of the chest 03/2016 without definitive ILD. LM and 2 vessel CAD, mild calcification on mitral and aortic valve. S/p AAA repair on 12/20/2019.  Her last episode of atrial fibrillation was in August 2021.   ? ?Patient admitted 06/15/21-06/21/21 with acute respiratory failure. Evaluation revealed anemia requiring transfusion, likely secondary to gastric ulcer. Patient was also treated for pneumonia and now follows with pulmonology for management of sleep disordered breathing and restrictive lung disease. Patient is now on 2 L via [place] at night. At discharged stopped Bidil and amlodipine. Patient was discharged on amiodarone 100 mg p.o. daily, Eliquis 2.5 mg p.o. twice daily, carvedilol 6.25 mg p.o. twice daily Lasix 20 mg p.o. daily, losartan 100 mg p.o. daily, and Nexlizet.  ? ?She now presents for follow up. Patient states her dyspnea continues to improve, although she does continue to have chronic cough, which is managed by Dr. Halford Chessman. Still unable to tolerate CPAP, however she is using oxygen at night. Leg swelling has improved following diuresis during hospitalization. Reports home BP readings averaging 120s/70s mmHg.  ? ?Past Medical History:  ?Diagnosis Date  ? Anemia   ? h/o hemorrhoidal bleeding and blood transfusion  ? Chronic diastolic CHF (congestive heart failure) ([place])   ? Depression   ? Diverticulosis   ? DOE (dyspnea on exertion)   ? Esophageal reflux   ? GERD  (gastroesophageal reflux disease)   ? Hypothyroidism   ? Insomnia   ? LBP (low back pain)   ? OAB (overactive bladder)   ? Obesity   ? OSA (obstructive sleep apnea)   ? Osteoarthritis   ? Paroxysmal atrial fibrillation (HCC)   ? Rheumatoid arthritis(714.0)   ? Shoulder pain, bilateral   ? Unspecified essential hypertension   ? Venous insufficiency   ? ?Past Surgical History:  ?Procedure Laterality Date  ? ABDOMINAL AORTIC ENDOVASCULAR STENT GRAFT N/A 12/20/2019  ? Procedure: Aortogram including catheter selection of aorta and bilateral iliac arteriogram, Endovascular repair of infrarenal abdominal aortic aneurysm with bifurcated stent graft (26 mm x 14 x 12 main body, right bell bottom with a 20 mm x 10 cm piece, and left bell bottom with a 16 mm x 12 cm piece) ;  Surgeon: Marty Heck, MD;  Location: [facility];  Service: Vascular;  Laterality: N/A;  ? APPENDECTOMY  1953  ? BIOPSY  06/18/2021  ? Procedure: BIOPSY;  Surgeon: Ronnette Juniper, MD;  Location: [facility];  Service: Gastroenterology;;  ? bladder abduction-1996  1996  ? breast biopsy Right 1980  ? CARDIOVERSION N/A 12/24/2019  ? Procedure: CARDIOVERSION;  Surgeon: Adrian Prows, MD;  Location: [facility] ENDOSCOPY;  Service: Cardiovascular;  Laterality: N/A;  ? [place]    ? 1957, 1961, 1964  ? COSMETIC SURGERY  1996  ? CYSTOCELE REPAIR    ? ESOPHAGOGASTRODUODENOSCOPY (EGD) WITH PROPOFOL N/A 06/18/2021  ? Procedure: ESOPHAGOGASTRODUODENOSCOPY (EGD) WITH PROPOFOL;  Surgeon: Ronnette Juniper, MD;  Location: [facility] ENDOSCOPY;  Service: Gastroenterology;  Laterality: N/A;  ?  FLEXIBLE SIGMOIDOSCOPY N/A 04/10/2015  ? Procedure: FLEXIBLE SIGMOIDOSCOPY;  Surgeon: Arta Silence, MD;  Location: [facility] ENDOSCOPY;  Service: Endoscopy;  Laterality: N/A;  ? knee arthroscopy Right 1996, 2010  ? KNEE ARTHROSCOPY W/ AUTOGENOUS CARTILAGE IMPLANTATION (ACI) PROCEDURE Left 1994, 1995  ? REFRACTIVE SURGERY  01/2020  ? SHOULDER SURGERY  1990  ? TEE WITHOUT CARDIOVERSION N/A 12/24/2019  ?  Procedure: TRANSESOPHAGEAL ECHOCARDIOGRAM (TEE);  Surgeon: Adrian Prows, MD;  Location: [place];  Service: Cardiovascular;  Laterality: N/A;  ? [place]  ? ULTRASOUND GUIDANCE FOR VASCULAR ACCESS Bilateral 12/20/2019  ? Procedure: Ultrasound-guided access of bilateral common femoral arteries for delivery of endograft and percutaneous closure;  Surgeon: Marty Heck, MD;  Location: [facility] OR;  Service: Vascular;  Laterality: Bilateral;  ? VESICOVAGINAL FISTULA CLOSURE W/ TAH    ? WRIST SURGERY  1967  ? ?Family History  ?Problem Relation Age of Onset  ? COPD Father   ? Lung cancer Mother   ? Heart attack Maternal Grandmother 80  ? Breast cancer Maternal Aunt   ? Lung cancer Son   ?  ?Social History  ? ?Tobacco Use  ? Smoking status: Former  ?  Packs/day: 0.20  ?  Years: 10.00  ?  Pack years: 2.00  ?  Types: Cigarettes  ?  Quit date: 04/11/1984  ?  Years since quitting: 37.2  ? Smokeless tobacco: Never  ?Substance Use Topics  ? Alcohol use: Yes  ?  Alcohol/week: 0.0 standard drinks  ?  Comment: cocktail once a week  ? ?Marital Status: Widowed  ?ROS  ?Review of Systems  ?Cardiovascular:  Positive for dyspnea on exertion (signifcantly improved) and leg swelling (improved). Negative for chest pain, orthopnea, palpitations, paroxysmal nocturnal dyspnea and syncope.  ?Respiratory:  Negative for cough, hemoptysis and wheezing.   ?Genitourinary:  Positive for bladder incontinence and urgency.  ? ?Objective  ?Blood pressure 140/74, pulse 60, temperature 98 ?F (36.7 ?C), temperature source Temporal, resp. rate 17, height '5\' 3"'$  (1.6 m), weight 209 lb (94.8 kg), SpO2 96 %.  ? ?  07/07/2021  ?  1:35 PM 06/28/2021  ? 12:01 PM 06/21/2021  ? 12:55 PM  ?Vitals with BMI  ?Height '5\' 3"'$  '5\' 3"'$    ?Weight 209 lbs 209 lbs 3 oz   ?BMI 37.03 37.07   ?Systolic 034 742 595  ?Diastolic 74 70 76  ?Pulse 60 57 65  ?  ? Physical Exam ?Vitals reviewed.  ?Constitutional:   ?   General: She is not in acute distress. ?    Appearance: She is well-developed.  ?Cardiovascular:  ?   Rate and Rhythm: Normal rate and regular rhythm. No extrasystoles are present. ?   Pulses: Intact distal pulses.     ?     Carotid pulses are 2+ on the right side and 2+ on the left side. ?     Radial pulses are 2+ on the right side and 2+ on the left side.  ?   Heart sounds: S1 normal and S2 normal. Murmur heard.  ?Harsh midsystolic murmur is present with a grade of 3/6 at the upper right sternal border radiating to the apex.  ?  No gallop.  ?   Comments: No JVD.  ?Pulmonary:  ?   Effort: Pulmonary effort is normal. No accessory muscle usage or respiratory distress.  ?   Breath sounds: Wheezing (scattered, expiratory) present. No rhonchi.  ?Musculoskeletal:  ?   Right lower leg: Edema (minimal) present.  ?  Left lower leg: Edema (minimal) present.  ?Neurological:  ?   Mental Status: She is alert.  ? ?Laboratory examination:  ? ?Recent Labs  ?  06/15/21 ?1712 06/16/21 ?0644 06/20/21 ?5681  ?NA 137 136 138  ?K 4.2 4.0 4.3  ?CL 93* 95* 93*  ?CO2 33* 32 37*  ?GLUCOSE 98 240* 100*  ?BUN 52* 50* 36*  ?CREATININE 2.00* 1.59* 1.28*  ?CALCIUM 8.7* 8.7* 8.5*  ?GFRNONAA 24* 31* 41*  ? ?estimated creatinine clearance is 34.6 mL/min (A) (by C-G formula based on SCr of 1.28 mg/dL (H)).  ? ?  Latest Ref Rng & Units 06/20/2021  ?  5:36 AM 06/16/2021  ?  6:44 AM 06/15/2021  ?  5:12 PM  ?CMP  ?Glucose 70 - 99 mg/dL 100   240   98    ?BUN 8 - 23 mg/dL 36   50   52    ?Creatinine 0.44 - 1.00 mg/dL 1.28   1.59   2.00    ?Sodium 135 - 145 mmol/L 138   136   137    ?Potassium 3.5 - 5.1 mmol/L 4.3   4.0   4.2    ?Chloride 98 - 111 mmol/L 93   95   93    ?CO2 22 - 32 mmol/L 37   32   33    ?Calcium 8.9 - 10.3 mg/dL 8.5   8.7   8.7    ? ? ?  Latest Ref Rng & Units 06/21/2021  ?  4:47 AM 06/20/2021  ?  5:24 AM 06/18/2021  ?  4:38 AM  ?CBC  ?WBC 4.0 - 10.5 K/uL 7.6   7.4   8.7    ?Hemoglobin 12.0 - 15.0 g/dL 8.7   8.7   8.5    ?Hematocrit 36.0 - 46.0 % 29.4   29.6   28.0    ?Platelets 150 -  400 K/uL 376   377   367    ? ?Lipid Panel ?   ?Component Value Date/Time  ? CHOL 126 03/14/2019 1121  ? TRIG 82 03/14/2019 1121  ? HDL 49 03/14/2019 1121  ? CHOLHDL 5.1 CALC 12/19/2006 0916  ? VLDL 26 12/19/2006 0916  ? LD

## 2021-07-08 DIAGNOSIS — I130 Hypertensive heart and chronic kidney disease with heart failure and stage 1 through stage 4 chronic kidney disease, or unspecified chronic kidney disease: Secondary | ICD-10-CM | POA: Diagnosis not present

## 2021-07-08 DIAGNOSIS — J9602 Acute respiratory failure with hypercapnia: Secondary | ICD-10-CM | POA: Diagnosis not present

## 2021-07-08 DIAGNOSIS — D509 Iron deficiency anemia, unspecified: Secondary | ICD-10-CM | POA: Diagnosis not present

## 2021-07-08 DIAGNOSIS — I5032 Chronic diastolic (congestive) heart failure: Secondary | ICD-10-CM | POA: Diagnosis not present

## 2021-07-08 DIAGNOSIS — N1832 Chronic kidney disease, stage 3b: Secondary | ICD-10-CM | POA: Diagnosis not present

## 2021-07-08 DIAGNOSIS — J9601 Acute respiratory failure with hypoxia: Secondary | ICD-10-CM | POA: Diagnosis not present

## 2021-07-09 ENCOUNTER — Telehealth: Payer: Self-pay | Admitting: Pharmacist

## 2021-07-09 ENCOUNTER — Other Ambulatory Visit: Payer: Self-pay | Admitting: Pharmacist

## 2021-07-09 DIAGNOSIS — N1832 Chronic kidney disease, stage 3b: Secondary | ICD-10-CM | POA: Diagnosis not present

## 2021-07-09 DIAGNOSIS — I130 Hypertensive heart and chronic kidney disease with heart failure and stage 1 through stage 4 chronic kidney disease, or unspecified chronic kidney disease: Secondary | ICD-10-CM | POA: Diagnosis not present

## 2021-07-09 DIAGNOSIS — J9602 Acute respiratory failure with hypercapnia: Secondary | ICD-10-CM | POA: Diagnosis not present

## 2021-07-09 DIAGNOSIS — I5032 Chronic diastolic (congestive) heart failure: Secondary | ICD-10-CM | POA: Diagnosis not present

## 2021-07-09 DIAGNOSIS — I10 Essential (primary) hypertension: Secondary | ICD-10-CM

## 2021-07-09 DIAGNOSIS — D509 Iron deficiency anemia, unspecified: Secondary | ICD-10-CM | POA: Diagnosis not present

## 2021-07-09 DIAGNOSIS — J9601 Acute respiratory failure with hypoxia: Secondary | ICD-10-CM | POA: Diagnosis not present

## 2021-07-09 MED ORDER — LOSARTAN POTASSIUM 100 MG PO TABS: 100.0000 mg | ORAL_TABLET | Freq: Every day | ORAL | 3 refills | Status: DC

## 2021-07-09 NOTE — Telephone Encounter (Signed)
Called and spoke to patient answered questions regarding her medications.  She does not need refills at this time. ?

## 2021-07-13 DIAGNOSIS — J9601 Acute respiratory failure with hypoxia: Secondary | ICD-10-CM | POA: Diagnosis not present

## 2021-07-13 DIAGNOSIS — I130 Hypertensive heart and chronic kidney disease with heart failure and stage 1 through stage 4 chronic kidney disease, or unspecified chronic kidney disease: Secondary | ICD-10-CM | POA: Diagnosis not present

## 2021-07-13 DIAGNOSIS — J9602 Acute respiratory failure with hypercapnia: Secondary | ICD-10-CM | POA: Diagnosis not present

## 2021-07-13 DIAGNOSIS — D509 Iron deficiency anemia, unspecified: Secondary | ICD-10-CM | POA: Diagnosis not present

## 2021-07-13 DIAGNOSIS — N1832 Chronic kidney disease, stage 3b: Secondary | ICD-10-CM | POA: Diagnosis not present

## 2021-07-13 DIAGNOSIS — I5032 Chronic diastolic (congestive) heart failure: Secondary | ICD-10-CM | POA: Diagnosis not present

## 2021-07-15 DIAGNOSIS — D509 Iron deficiency anemia, unspecified: Secondary | ICD-10-CM | POA: Diagnosis not present

## 2021-07-15 DIAGNOSIS — I5032 Chronic diastolic (congestive) heart failure: Secondary | ICD-10-CM | POA: Diagnosis not present

## 2021-07-15 DIAGNOSIS — J9602 Acute respiratory failure with hypercapnia: Secondary | ICD-10-CM | POA: Diagnosis not present

## 2021-07-15 DIAGNOSIS — N1832 Chronic kidney disease, stage 3b: Secondary | ICD-10-CM | POA: Diagnosis not present

## 2021-07-15 DIAGNOSIS — I130 Hypertensive heart and chronic kidney disease with heart failure and stage 1 through stage 4 chronic kidney disease, or unspecified chronic kidney disease: Secondary | ICD-10-CM | POA: Diagnosis not present

## 2021-07-15 DIAGNOSIS — J9601 Acute respiratory failure with hypoxia: Secondary | ICD-10-CM | POA: Diagnosis not present

## 2021-07-20 DIAGNOSIS — I5032 Chronic diastolic (congestive) heart failure: Secondary | ICD-10-CM | POA: Diagnosis not present

## 2021-07-20 DIAGNOSIS — N1832 Chronic kidney disease, stage 3b: Secondary | ICD-10-CM | POA: Diagnosis not present

## 2021-07-20 DIAGNOSIS — I130 Hypertensive heart and chronic kidney disease with heart failure and stage 1 through stage 4 chronic kidney disease, or unspecified chronic kidney disease: Secondary | ICD-10-CM | POA: Diagnosis not present

## 2021-07-20 DIAGNOSIS — D509 Iron deficiency anemia, unspecified: Secondary | ICD-10-CM | POA: Diagnosis not present

## 2021-07-20 DIAGNOSIS — J9602 Acute respiratory failure with hypercapnia: Secondary | ICD-10-CM | POA: Diagnosis not present

## 2021-07-20 DIAGNOSIS — J9601 Acute respiratory failure with hypoxia: Secondary | ICD-10-CM | POA: Diagnosis not present

## 2021-07-22 DIAGNOSIS — N1832 Chronic kidney disease, stage 3b: Secondary | ICD-10-CM | POA: Diagnosis not present

## 2021-07-22 DIAGNOSIS — I130 Hypertensive heart and chronic kidney disease with heart failure and stage 1 through stage 4 chronic kidney disease, or unspecified chronic kidney disease: Secondary | ICD-10-CM | POA: Diagnosis not present

## 2021-07-22 DIAGNOSIS — J9602 Acute respiratory failure with hypercapnia: Secondary | ICD-10-CM | POA: Diagnosis not present

## 2021-07-22 DIAGNOSIS — J9601 Acute respiratory failure with hypoxia: Secondary | ICD-10-CM | POA: Diagnosis not present

## 2021-07-22 DIAGNOSIS — D509 Iron deficiency anemia, unspecified: Secondary | ICD-10-CM | POA: Diagnosis not present

## 2021-07-22 DIAGNOSIS — I5032 Chronic diastolic (congestive) heart failure: Secondary | ICD-10-CM | POA: Diagnosis not present

## 2021-07-23 DIAGNOSIS — J45909 Unspecified asthma, uncomplicated: Secondary | ICD-10-CM | POA: Diagnosis not present

## 2021-07-23 DIAGNOSIS — J9601 Acute respiratory failure with hypoxia: Secondary | ICD-10-CM | POA: Diagnosis not present

## 2021-07-23 DIAGNOSIS — N3281 Overactive bladder: Secondary | ICD-10-CM | POA: Diagnosis not present

## 2021-07-23 DIAGNOSIS — I5032 Chronic diastolic (congestive) heart failure: Secondary | ICD-10-CM | POA: Diagnosis not present

## 2021-07-23 DIAGNOSIS — K259 Gastric ulcer, unspecified as acute or chronic, without hemorrhage or perforation: Secondary | ICD-10-CM | POA: Diagnosis not present

## 2021-07-23 DIAGNOSIS — M1990 Unspecified osteoarthritis, unspecified site: Secondary | ICD-10-CM | POA: Diagnosis not present

## 2021-07-23 DIAGNOSIS — D631 Anemia in chronic kidney disease: Secondary | ICD-10-CM | POA: Diagnosis not present

## 2021-07-23 DIAGNOSIS — G4700 Insomnia, unspecified: Secondary | ICD-10-CM | POA: Diagnosis not present

## 2021-07-23 DIAGNOSIS — M25512 Pain in left shoulder: Secondary | ICD-10-CM | POA: Diagnosis not present

## 2021-07-23 DIAGNOSIS — G4733 Obstructive sleep apnea (adult) (pediatric): Secondary | ICD-10-CM | POA: Diagnosis not present

## 2021-07-23 DIAGNOSIS — J9602 Acute respiratory failure with hypercapnia: Secondary | ICD-10-CM | POA: Diagnosis not present

## 2021-07-23 DIAGNOSIS — R1313 Dysphagia, pharyngeal phase: Secondary | ICD-10-CM | POA: Diagnosis not present

## 2021-07-23 DIAGNOSIS — F32A Depression, unspecified: Secondary | ICD-10-CM | POA: Diagnosis not present

## 2021-07-23 DIAGNOSIS — I872 Venous insufficiency (chronic) (peripheral): Secondary | ICD-10-CM | POA: Diagnosis not present

## 2021-07-23 DIAGNOSIS — I130 Hypertensive heart and chronic kidney disease with heart failure and stage 1 through stage 4 chronic kidney disease, or unspecified chronic kidney disease: Secondary | ICD-10-CM | POA: Diagnosis not present

## 2021-07-23 DIAGNOSIS — N1832 Chronic kidney disease, stage 3b: Secondary | ICD-10-CM | POA: Diagnosis not present

## 2021-07-23 DIAGNOSIS — M25511 Pain in right shoulder: Secondary | ICD-10-CM | POA: Diagnosis not present

## 2021-07-23 DIAGNOSIS — I480 Paroxysmal atrial fibrillation: Secondary | ICD-10-CM | POA: Diagnosis not present

## 2021-07-23 DIAGNOSIS — I714 Abdominal aortic aneurysm, without rupture, unspecified: Secondary | ICD-10-CM | POA: Diagnosis not present

## 2021-07-23 DIAGNOSIS — K5790 Diverticulosis of intestine, part unspecified, without perforation or abscess without bleeding: Secondary | ICD-10-CM | POA: Diagnosis not present

## 2021-07-23 DIAGNOSIS — M069 Rheumatoid arthritis, unspecified: Secondary | ICD-10-CM | POA: Diagnosis not present

## 2021-07-23 DIAGNOSIS — D509 Iron deficiency anemia, unspecified: Secondary | ICD-10-CM | POA: Diagnosis not present

## 2021-07-23 DIAGNOSIS — G2581 Restless legs syndrome: Secondary | ICD-10-CM | POA: Diagnosis not present

## 2021-07-23 DIAGNOSIS — E039 Hypothyroidism, unspecified: Secondary | ICD-10-CM | POA: Diagnosis not present

## 2021-07-23 DIAGNOSIS — K219 Gastro-esophageal reflux disease without esophagitis: Secondary | ICD-10-CM | POA: Diagnosis not present

## 2021-07-26 ENCOUNTER — Other Ambulatory Visit: Payer: Self-pay

## 2021-07-26 ENCOUNTER — Ambulatory Visit: Payer: Medicare Other

## 2021-07-26 DIAGNOSIS — J189 Pneumonia, unspecified organism: Secondary | ICD-10-CM

## 2021-07-27 ENCOUNTER — Ambulatory Visit (INDEPENDENT_AMBULATORY_CARE_PROVIDER_SITE_OTHER): Payer: Medicare Other | Admitting: Pulmonary Disease

## 2021-07-27 ENCOUNTER — Encounter: Payer: Self-pay | Admitting: Pulmonary Disease

## 2021-07-27 ENCOUNTER — Ambulatory Visit (INDEPENDENT_AMBULATORY_CARE_PROVIDER_SITE_OTHER): Payer: Medicare Other

## 2021-07-27 VITALS — BP 128/76 | HR 65 | Temp 97.9°F | Ht 63.5 in | Wt 210.0 lb

## 2021-07-27 DIAGNOSIS — J9611 Chronic respiratory failure with hypoxia: Secondary | ICD-10-CM

## 2021-07-27 DIAGNOSIS — I517 Cardiomegaly: Secondary | ICD-10-CM | POA: Diagnosis not present

## 2021-07-27 DIAGNOSIS — J9602 Acute respiratory failure with hypercapnia: Secondary | ICD-10-CM | POA: Diagnosis not present

## 2021-07-27 DIAGNOSIS — N1832 Chronic kidney disease, stage 3b: Secondary | ICD-10-CM | POA: Diagnosis not present

## 2021-07-27 DIAGNOSIS — I5032 Chronic diastolic (congestive) heart failure: Secondary | ICD-10-CM | POA: Diagnosis not present

## 2021-07-27 DIAGNOSIS — J189 Pneumonia, unspecified organism: Secondary | ICD-10-CM | POA: Diagnosis not present

## 2021-07-27 DIAGNOSIS — J4540 Moderate persistent asthma, uncomplicated: Secondary | ICD-10-CM | POA: Diagnosis not present

## 2021-07-27 DIAGNOSIS — J811 Chronic pulmonary edema: Secondary | ICD-10-CM | POA: Diagnosis not present

## 2021-07-27 DIAGNOSIS — J9601 Acute respiratory failure with hypoxia: Secondary | ICD-10-CM | POA: Diagnosis not present

## 2021-07-27 DIAGNOSIS — D509 Iron deficiency anemia, unspecified: Secondary | ICD-10-CM | POA: Diagnosis not present

## 2021-07-27 DIAGNOSIS — I130 Hypertensive heart and chronic kidney disease with heart failure and stage 1 through stage 4 chronic kidney disease, or unspecified chronic kidney disease: Secondary | ICD-10-CM | POA: Diagnosis not present

## 2021-07-27 MED ORDER — ALBUTEROL SULFATE (2.5 MG/3ML) 0.083% IN NEBU: 2.5000 mg | INHALATION_SOLUTION | Freq: Four times a day (QID) | RESPIRATORY_TRACT | 12 refills | Status: DC | PRN

## 2021-07-27 MED ORDER — BUDESONIDE 0.25 MG/2ML IN SUSP: 0.2500 mg | Freq: Two times a day (BID) | RESPIRATORY_TRACT | 12 refills | Status: DC

## 2021-07-27 NOTE — Progress Notes (Signed)
? ?[facility] ? ?Chief Complaint  ?Patient presents with  ? Follow-up  ?  Not needing to use O2 during day  ? ? ?Past Surgical History:  ?She  has a past surgical history that includes Appendectomy (4010); Vesicovaginal fistula closure w/ TAH; breast biopsy (Right, 1980); Knee arthroscopy w/ autogenous cartilage implantation procedure (Left, 1994, 1995); knee arthroscopy (Right, 1996, 2010); Shoulder surgery (1990); Cystocele repair; Cesarean section; Wrist surgery (2725); Total abdominal hysterectomy (1972); bladder abduction-1996 (1996); Cosmetic surgery (1996); Flexible sigmoidoscopy (N/A, 04/10/2015); Abdominal aortic endovascular stent graft (N/A, 12/20/2019); Ultrasound guidance for vascular access (Bilateral, 12/20/2019); TEE without cardioversion (N/A, 12/24/2019); Cardioversion (N/A, 12/24/2019); Refractive surgery (01/2020); Esophagogastroduodenoscopy (egd) with propofol (N/A, 06/18/2021); and biopsy (06/18/2021). ? ?Past Medical History:  ?Diastolic CHF, Depression, Diverticulosis, GERD, Hypothyroidism, Insomnia, Sciatica, Overactive bladder, OA, Paroxysmal atrial fibrillation, Rheumatoid arthritis, Hypertension, CKD 3b ? ?Constitutional:  ?BP 128/76 (BP Location: Left Arm, Patient Position: Sitting, Cuff Size: Normal)   Pulse 65   Temp 97.9 ?F (36.6 ?C) (Oral)   Ht 5' 3.5" (1.613 m)   Wt 210 lb (95.3 kg)   SpO2 95%   BMI 36.62 kg/m?  ? ?Brief Summary:  ?Lisa Mcclure is a 88 y.o. female former smoker with chronic respiratory failure and atypical pneumonia.  She had BOOP in the setting of rheumatoid arthritis in 2010.  She has history of obstructive sleep apnea, but is intolerant of CPAP. ?  ? ? ? ?Subjective:  ? ?She is here with her aide. ? ?Chest xray today looks better. ? ?She has tried albuterol, but having trouble coordinating when to push and when to inhale.  She has a spacer device. ? ?She is working with PT at home.  When walking at home her SpO2  stays above 94% on room air.  No using oxygen during the day anymore.  Still using 2 liters at night. ? ?Has cough with thick, white sputum (looks like Elmer's glue). ? ?Physical Exam:  ? ?Appearance - well kempt  ? ?ENMT - no sinus tenderness, no oral exudate, no LAN, Mallampati 3 airway, no stridor ? ?Respiratory - equal breath sounds bilaterally, no wheezing or rales ? ?CV - s1s2 regular rate and rhythm, no murmurs ? ?Ext - no clubbing, no edema ? ?Skin - no rashes ? ?Psych - normal mood and affect ? ?  ?Pulmonary testing:  ?PFT 02/11/16 >> FEV1 1.45 (79%), FEV1% 81, TLC 3.75 (76%), DLCO 65% ?ABG 06/19/21 >> pH 7.41, PCO2  62, PO2 82 ? ?Chest Imaging:  ?CT chest 06/18/21 >> patchy GGO in RUL and RLL, scarr RUL ? ?Sleep Tests:  ?PSG 08/13/19 >> AHI 48.9, SpO2 low 58% ? ?Cardiac Tests:  ?Echo 04/20/21 >> EF 55 to 60%, mod LVH, severe LA dilation, mod AS, mild MR, mild TR, RVSP 42 mmHg ? ?Social History:  ?She  reports that she quit smoking about 37 years ago. Her smoking use included cigarettes. She has a 2.00 pack-year smoking history. She has never used smokeless tobacco. She reports current alcohol use. She reports that she does not use drugs. ? ?Family History:  ?Her family history includes Breast cancer in her maternal aunt; COPD in her father; Heart attack (age of onset: 71) in her maternal grandmother; Lung cancer in her mother and son. ?  ? ? ?Assessment/Plan:  ? ?Chronic hypoxic, hypercapnic respiratory failure. ?- from restrictive lung disease in setting of obesity and kyphosis, sleep disordered breathing, opiate medication use, and made worse  by recent pneumonia ?- O2 needs improved ?- will have daytime home oxygen set up removed ? ?Chronic cough. ?- from post nasal drip, reflux, and reactive airways after recent pneumonia ?- will have her try pulmicort 0.25 mg bid and prn albuterol by nebulizer device ?- prn flonase ?- continue protonix ?- prn albuterol, and will arrange for spacer device ? ?Sleep  disordered breathing. ?- intolerant of PAP therapy ?- continue 2 liters oxygen at night ?- she sleeps in adjustable bed with upper body elevated ? ?Atypical pneumonia. ?- concern for recurrent silent aspiration versus lipoid pneumonia from chronic Vicks vapor rub use ?- clinically improving ? ?Gastric ulcer, reflux. ?- will need follow up with [facility] gastroenterology ? ?Paroxysmal atrial fibrillation, Chronic diastolic CHF, Aortic stenosis. ?- followed by [facility] ? ?Sciatica, hip pain. ?- followed by Dr. Jean Rosenthal with [facility] ? ?Goals of care. ?- DNR/DNI ? ?Time Spent Involved in Patient Care on Day of Examination:  ?37 minutes ? ?Follow up:  ? ?Patient Instructions  ?Will have [place] arrange for a home nebulizer machine and remove your daytime oxygen set up. ? ?Pulmicort one vial nebulized in the morning and in the evening, and rinse your mouth after each use. ? ?Albuterol one vial nebulized every 6 hours as needed for cough, wheeze, or chest congestion. ? ?Follow up in 4 months. ? ?Medication List:  ? ?Allergies as of 07/27/2021   ? ?   Reactions  ? Statins Other (See Comments)  ? Muscle aches and INTERNAL BLEEDING  ? Atorvastatin   ? Other reaction(s): Myalgias  ? Gabapentin Other (See Comments)  ? "Made me loopy"  ? Methocarbamol Other (See Comments)  ? "Made me loopy"  ? ?  ? ?  ?Medication List  ?  ? ?  ? Accurate as of July 27, 2021  1:59 PM. If you have any questions, ask your nurse or doctor.  ?  ?  ? ?  ? ?albuterol 108 (90 Base) MCG/ACT inhaler ?Commonly known as: VENTOLIN HFA ?Inhale 2 puffs into the lungs every 6 (six) hours as needed for wheezing or shortness of breath. ?What changed: Another medication with the same name was added. Make sure you understand how and when to take each. ?Changed by: Chesley Mires, MD ?  ?albuterol (2.5 MG/3ML) 0.083% nebulizer solution ?Commonly known as: PROVENTIL ?Take 3 mLs (2.5 mg total) by nebulization every 6 (six) hours as needed for  wheezing or shortness of breath. ?What changed: You were already taking a medication with the same name, and this prescription was added. Make sure you understand how and when to take each. ?Changed by: Chesley Mires, MD ?  ?amiodarone 100 MG tablet ?Commonly known as: PACERONE ?Take 2 tablets (200 mg total) by mouth daily. ?  ?apixaban 2.5 MG Tabs tablet ?Commonly known as: ELIQUIS ?Take 1 tablet (2.5 mg total) by mouth 2 (two) times daily. ?  ?budesonide 0.25 MG/2ML nebulizer solution ?Commonly known as: Pulmicort ?Take 2 mLs (0.25 mg total) by nebulization 2 (two) times daily. ?Started by: Chesley Mires, MD ?  ?carvedilol 6.25 MG tablet ?Commonly known as: COREG ?Take 1 tablet (6.25 mg total) by mouth 2 (two) times daily with a meal for 14 days. ?  ?diclofenac sodium 1 % Gel ?Commonly known as: VOLTAREN ?Apply 2.25 g topically 3 (three) times daily as needed (for pain). ?  ?ferrous sulfate 325 (65 FE) MG tablet ?Take 1 tablet (325 mg total) by mouth daily with breakfast. ?  ?  furosemide 20 MG tablet ?Commonly known as: LASIX ?Take 1 tablet (20 mg total) by mouth daily as needed for fluid or edema. Take Lasix 20 mg once daily with an additional dose as needed for fluid overload. ?What changed:  ?when to take this ?additional instructions ?  ?HYDROcodone-acetaminophen 7.5-325 MG tablet ?Commonly known as: NORCO ?Take 1 tablet by mouth every 6 (six) hours as needed for moderate pain. ?  ?ICAPS AREDS 2 PO ?Take 1 capsule by mouth 2 (two) times daily. ?  ?latanoprost 0.005 % ophthalmic solution ?Commonly known as: XALATAN ?Place 1 drop into both eyes at bedtime. ?  ?levothyroxine 125 MCG tablet ?Commonly known as: SYNTHROID ?Take 125 mcg by mouth daily before breakfast. ?  ?losartan 100 MG tablet ?Commonly known as: COZAAR ?Take 1 tablet (100 mg total) by mouth daily. TAKE ONE TABLET BY MOUTH AT NOON Strength: 100 mg ?  ?Nexlizet 180-10 MG Tabs ?Generic drug: Bempedoic Acid-Ezetimibe ?Take 1 capsule by mouth daily. ?What  changed: additional instructions ?  ?pantoprazole 40 MG tablet ?Commonly known as: Protonix ?Take 1 tablet (40 mg total) by mouth daily. Take Protonix 40 mg daily after 08/20/2021 when on Eliquis. ?Start taking on: May 13

## 2021-07-27 NOTE — Patient Instructions (Signed)
Will have [place] arrange for a home nebulizer machine and remove your daytime oxygen set up. ? ?Pulmicort one vial nebulized in the morning and in the evening, and rinse your mouth after each use. ? ?Albuterol one vial nebulized every 6 hours as needed for cough, wheeze, or chest congestion. ? ?Follow up in 4 months. ?

## 2021-07-28 DIAGNOSIS — I5032 Chronic diastolic (congestive) heart failure: Secondary | ICD-10-CM | POA: Diagnosis not present

## 2021-07-28 DIAGNOSIS — I4891 Unspecified atrial fibrillation: Secondary | ICD-10-CM | POA: Diagnosis not present

## 2021-07-28 DIAGNOSIS — I110 Hypertensive heart disease with heart failure: Secondary | ICD-10-CM | POA: Diagnosis not present

## 2021-07-28 DIAGNOSIS — I10 Essential (primary) hypertension: Secondary | ICD-10-CM | POA: Diagnosis not present

## 2021-07-28 DIAGNOSIS — E039 Hypothyroidism, unspecified: Secondary | ICD-10-CM | POA: Diagnosis not present

## 2021-07-28 DIAGNOSIS — I130 Hypertensive heart and chronic kidney disease with heart failure and stage 1 through stage 4 chronic kidney disease, or unspecified chronic kidney disease: Secondary | ICD-10-CM | POA: Diagnosis not present

## 2021-07-28 DIAGNOSIS — N1832 Chronic kidney disease, stage 3b: Secondary | ICD-10-CM | POA: Diagnosis not present

## 2021-07-28 DIAGNOSIS — D649 Anemia, unspecified: Secondary | ICD-10-CM | POA: Diagnosis not present

## 2021-07-28 DIAGNOSIS — E785 Hyperlipidemia, unspecified: Secondary | ICD-10-CM | POA: Diagnosis not present

## 2021-07-28 DIAGNOSIS — K219 Gastro-esophageal reflux disease without esophagitis: Secondary | ICD-10-CM | POA: Diagnosis not present

## 2021-07-28 DIAGNOSIS — R197 Diarrhea, unspecified: Secondary | ICD-10-CM | POA: Diagnosis not present

## 2021-07-28 DIAGNOSIS — M810 Age-related osteoporosis without current pathological fracture: Secondary | ICD-10-CM | POA: Diagnosis not present

## 2021-07-29 DIAGNOSIS — R509 Fever, unspecified: Secondary | ICD-10-CM | POA: Diagnosis not present

## 2021-07-29 DIAGNOSIS — R197 Diarrhea, unspecified: Secondary | ICD-10-CM | POA: Diagnosis not present

## 2021-07-30 DIAGNOSIS — D509 Iron deficiency anemia, unspecified: Secondary | ICD-10-CM | POA: Diagnosis not present

## 2021-07-30 DIAGNOSIS — I130 Hypertensive heart and chronic kidney disease with heart failure and stage 1 through stage 4 chronic kidney disease, or unspecified chronic kidney disease: Secondary | ICD-10-CM | POA: Diagnosis not present

## 2021-07-30 DIAGNOSIS — N1832 Chronic kidney disease, stage 3b: Secondary | ICD-10-CM | POA: Diagnosis not present

## 2021-07-30 DIAGNOSIS — J9601 Acute respiratory failure with hypoxia: Secondary | ICD-10-CM | POA: Diagnosis not present

## 2021-07-30 DIAGNOSIS — I5032 Chronic diastolic (congestive) heart failure: Secondary | ICD-10-CM | POA: Diagnosis not present

## 2021-07-30 DIAGNOSIS — J9602 Acute respiratory failure with hypercapnia: Secondary | ICD-10-CM | POA: Diagnosis not present

## 2021-08-02 DIAGNOSIS — Z0000 Encounter for general adult medical examination without abnormal findings: Secondary | ICD-10-CM | POA: Diagnosis not present

## 2021-08-02 DIAGNOSIS — E785 Hyperlipidemia, unspecified: Secondary | ICD-10-CM | POA: Diagnosis not present

## 2021-08-02 DIAGNOSIS — G4734 Idiopathic sleep related nonobstructive alveolar hypoventilation: Secondary | ICD-10-CM | POA: Diagnosis not present

## 2021-08-02 DIAGNOSIS — M5416 Radiculopathy, lumbar region: Secondary | ICD-10-CM | POA: Diagnosis not present

## 2021-08-02 DIAGNOSIS — E039 Hypothyroidism, unspecified: Secondary | ICD-10-CM | POA: Diagnosis not present

## 2021-08-02 DIAGNOSIS — L97221 Non-pressure chronic ulcer of left calf limited to breakdown of skin: Secondary | ICD-10-CM | POA: Diagnosis not present

## 2021-08-02 DIAGNOSIS — M810 Age-related osteoporosis without current pathological fracture: Secondary | ICD-10-CM | POA: Diagnosis not present

## 2021-08-02 DIAGNOSIS — R82998 Other abnormal findings in urine: Secondary | ICD-10-CM | POA: Diagnosis not present

## 2021-08-02 DIAGNOSIS — G4733 Obstructive sleep apnea (adult) (pediatric): Secondary | ICD-10-CM | POA: Diagnosis not present

## 2021-08-02 DIAGNOSIS — I5032 Chronic diastolic (congestive) heart failure: Secondary | ICD-10-CM | POA: Diagnosis not present

## 2021-08-02 DIAGNOSIS — I350 Nonrheumatic aortic (valve) stenosis: Secondary | ICD-10-CM | POA: Diagnosis not present

## 2021-08-02 DIAGNOSIS — I10 Essential (primary) hypertension: Secondary | ICD-10-CM | POA: Diagnosis not present

## 2021-08-03 DIAGNOSIS — I5032 Chronic diastolic (congestive) heart failure: Secondary | ICD-10-CM | POA: Diagnosis not present

## 2021-08-03 DIAGNOSIS — J9601 Acute respiratory failure with hypoxia: Secondary | ICD-10-CM | POA: Diagnosis not present

## 2021-08-03 DIAGNOSIS — N1832 Chronic kidney disease, stage 3b: Secondary | ICD-10-CM | POA: Diagnosis not present

## 2021-08-03 DIAGNOSIS — J9602 Acute respiratory failure with hypercapnia: Secondary | ICD-10-CM | POA: Diagnosis not present

## 2021-08-03 DIAGNOSIS — D509 Iron deficiency anemia, unspecified: Secondary | ICD-10-CM | POA: Diagnosis not present

## 2021-08-03 DIAGNOSIS — I130 Hypertensive heart and chronic kidney disease with heart failure and stage 1 through stage 4 chronic kidney disease, or unspecified chronic kidney disease: Secondary | ICD-10-CM | POA: Diagnosis not present

## 2021-08-06 NOTE — Patient Outreach (Signed)
Received a insurance notification for a Engineer, maintenance for Ms. Fichera. ?I have assigned [name], [place] to call for follow up and determine if there are any needs.  ?  ?Arville Care, CBCS, CMAA ?[place] Management Assistant ?[place] Management ?[phone]   ?

## 2021-08-09 ENCOUNTER — Other Ambulatory Visit: Payer: Self-pay | Admitting: *Deleted

## 2021-08-09 ENCOUNTER — Encounter: Payer: Self-pay | Admitting: *Deleted

## 2021-08-10 NOTE — Patient Instructions (Signed)
Visit Information ? ?Thank you for taking time to visit with me today. Please don't hesitate to contact me if I can be of assistance to you before our next scheduled telephone appointment. ? ?Following are the goals we discussed today:  ? ?Take all medications as prescribed ?Attend all scheduled provider appointments ?Call provider office for new concerns or questions  ?call office if I gain more than 2 pounds in one day or 5 pounds in one week ?keep legs up while sitting ?track weight in diary ?use salt in moderation ?watch for swelling in feet, ankles and legs every day ?weigh myself daily ?bring diary to all appointments ?develop a rescue plan ?follow rescue plan if symptoms flare-up ?Begin doing chair exercises daily; Nurse will send you printouts ?With your walker locked in front of you and your recliner behind you stand and sit back down onto recliner to gain strength in your legs ? ?Following is a copy of your care plan:  ?Care Plan : [place] of Care  ?Updates made by Michiel Cowboy, RN since 08/10/2021 12:00 AM  ?  ? ?Problem: Knowledge Deficit Related to CHF   ?Priority: High  ?  ? ?Long-Range Goal: Development of Plan of Care for the Management of CHF   ?Start Date: 08/10/2021  ?Expected End Date: 09/08/2022  ?Priority: High  ?Note:   ?Current Barriers:  ?Chronic Disease Management support and education needs related to CHF  ? ?RNCM Clinical Goal(s):  ?Patient will demonstrate Ongoing adherence to prescribed treatment plan for CHF as evidenced by Continuation of weighing daily and recording the weight, adhering to a low sodium diet, monitoring for symptoms of CHF, taking prescribed medications as written, contacting providers for questions and concerns ?work with Education officer, museum to address  related to the management of [place] Concerns  related to the management of feelings of depression as evidenced by review of EMR and patient or social worker report through collaboration with Consulting civil engineer,  provider, and care team.  ? ?Interventions: ?Inter-disciplinary care team collaboration (see longitudinal plan of care) ?Evaluation of current treatment plan related to  self management and patient's adherence to plan as established by provider ? ? ?Heart Failure Interventions:  (Status:  Goal on track:  Yes.) Long Term Goal ?Basic overview and discussion of pathophysiology of Heart Failure reviewed ?Provided education on low sodium diet ?Reviewed Heart Failure Action Plan in depth and provided written copy ?Assessed need for readable accurate scales in home ?Provided education about placing scale on hard, flat surface ?Advised patient to weigh each morning after emptying bladder ?Discussed importance of daily weight and advised patient to weigh and record daily ?Reviewed role of diuretics in prevention of fluid overload and management of heart failure; ?Screening for signs and symptoms of depression related to chronic disease state  ?Assessed social determinant of health barriers  ?Referral sent to LCSW for assistance with feelings of depression ?Discussed increasing physical activity by doing chair exercises; nurse will send chair exercise printouts ?Discussed keeping her legs elevated to help decrease BLE edema; patient reports she cannot put on compression stocking even with sock aid device ? ?Patient Goals/Self-Care Activities: ?Take all medications as prescribed ?Attend all scheduled provider appointments ?Call provider office for new concerns or questions  ?call office if I gain more than 2 pounds in one day or 5 pounds in one week ?keep legs up while sitting ?track weight in diary ?use salt in moderation ?watch for swelling in feet, ankles and  legs every day ?weigh myself daily ?bring diary to all appointments ?develop a rescue plan ?follow rescue plan if symptoms flare-up ?Begin doing chair exercises daily; Nurse will send you printouts ?With your walker locked in front of you and your recliner behind you  stand and sit back down onto recliner to gain strength in your legs ? ?Follow Up Plan:  Telephone follow up appointment with care management team member scheduled for:  June ? ?  ? ? ?The patient verbalized understanding of instructions, educational materials, and care plan provided today and agreed to receive a mailed copy of patient instructions, educational materials, and care plan.  ? ?Telephone follow up appointment with care management team member scheduled for: June ? ?[name] RN, BSN ?Nurse Health Coach ?[place] ?[phone] ?[email or web address] ? ? ?  ?

## 2021-08-10 NOTE — Patient Outreach (Signed)
[facility]) Care Management ? ?08/10/2021 ? ?Melvyn Novas Seiter ?[birth year] ?[id number] ? ?[facility]) Care Management ?RN Health Coach Note ? ? ?08/10/2021 ?Name:  NANDI TONNESEN MRN:  [id number] DOB:  1933/02/01 ? ?Summary: ?Patient states she is doing fairly well. Reporting that her breathing has improved since her discharge from the hospital. She no longer is using oxygen during the day; she wears it to sleep. Patient states that the edema in her legs has improved some. She continues to take her diuretic and is keeping her legs elevated. Patient weighs daily, reports her weight is consistent, and that she knows to notify provider when she gains 2-3 pounds over night or 5 pounds in a week. Patient shared that she is having feelings of depression due to her chronic illnesses and losing her independence. She reports that speaking with LCSW would be beneficial and that her PCP offered to prescribe anti-depression medication but she declined. Nurse will send a referral to [facility] LCSW to counsel patient. Patient states her home environment is safe and that she is well supported by her daughter. Patient did not have any further questions or concerns today and did confirm that she has this nurse's contact number to call her if needed.  ? ?Recommendations/Changes made from today's visit: ? ?Call provider office for new concerns or questions  ?call office if I gain more than 2 pounds in one day or 5 pounds in one week ?weigh myself daily ?Begin doing chair exercises daily ?With your walker locked in front of you and your recliner behind you stand and sit back down onto recliner to gain strength in your legs ? ?Subjective: ?SAANYA ZIESKE is an 88 y.o. year old female who is a primary patient of Philip Aspen, Ermalene Searing, MD. The care management team was consulted for assistance with care management and/or care coordination needs.   ? ?RN Health Coach completed Telephone Visit today.   ? ?Objective: ? ?Medications Reviewed Today   ? ? Reviewed by Michiel Cowboy, RN (Registered Nurse) on 08/09/21 at [place] [facility] Status: <None>  ? ?Medication Order Taking? Sig Documenting Provider Last Dose Status Informant  ?albuterol (PROVENTIL) (2.5 MG/3ML) 0.083% nebulizer solution [id number] Yes Take 3 mLs (2.5 mg total) by nebulization every 6 (six) hours as needed for wheezing or shortness of breath. Chesley Mires, MD Taking Active   ?albuterol (VENTOLIN HFA) 108 (90 Base) MCG/ACT inhaler [id number] Yes Inhale 2 puffs into the lungs every 6 (six) hours as needed for wheezing or shortness of breath. Cantwell, Gerline Legacy, PA-C Taking Active Self  ?         ?Med Note [facility], [place] Jun 15, 2021 10:33 PM)    ?amiodarone (PACERONE) 100 MG tablet [id number] Yes Take 2 tablets (200 mg total) by mouth daily. Cantwell, [name] C, PA-C Taking Active Self  ?apixaban (ELIQUIS) 2.5 MG TABS tablet [id number] Yes Take 1 tablet (2.5 mg total) by mouth 2 (two) times daily. Cantwell, Gerline Legacy, PA-C Taking Active Self  ?         ?Med Note ([facility], [place] Jun 15, 2021 10:44 PM) Takes at 3 to 4pm and 10 to 1100pm - medication comes from manufacturer  ?Bempedoic Acid-Ezetimibe (NEXLIZET) 180-10 MG TABS [id number] Yes Take 1 capsule by mouth daily.  ?Patient taking differently: Take 1 capsule by mouth daily. Taking it at bedtime  ? Adrian Prows, MD Taking Active Self  ?budesonide (PULMICORT) 0.25  MG/2ML nebulizer solution [id number] Yes Take 2 mLs (0.25 mg total) by nebulization 2 (two) times daily. Chesley Mires, MD Taking Active   ?carvedilol (COREG) 6.25 MG tablet [id number]  Take 1 tablet (6.25 mg total) by mouth 2 (two) times daily with a meal for 14 days. Cerissa, [place], [place]  Expired 07/07/21 2359   ?Cholecalciferol (VITAMIN D3) 125 MCG (5000 UT) CAPS [id number] Yes Take 5,000 Units by mouth at bedtime. [provider] Taking Active Self  ?diclofenac sodium (VOLTAREN) 1 % GEL [id number] Yes Apply 2.25 g topically 3  (three) times daily as needed (for pain).  [provider] Taking Active Self  ?doxycycline (VIBRAMYCIN) 100 MG capsule [id number] Yes Take 100 mg by mouth 2 (two) times daily. [provider] Taking Active   ?ferrous sulfate 325 (65 FE) MG tablet [id number] Yes Take 1 tablet (325 mg total) by mouth daily with breakfast. Kayleen Memos, DO Taking Active   ?furosemide (LASIX) 20 MG tablet [id number] Yes Take 1 tablet (20 mg total) by mouth daily as needed for fluid or edema. Take Lasix 20 mg once daily with an additional dose as needed for fluid overload.  ?Patient taking differently: Take 20 mg by mouth daily. Take '20mg'$  if needed for fluid overload  ? Cantwell, Gerline Legacy, PA-C Taking Active Self  ?         ?Med Note Olga Millers, CECILIA   Wed Jul 07, 2021  1:33 PM)    ?HYDROcodone-acetaminophen (NORCO) 7.5-325 MG tablet [id number] Yes Take 1 tablet by mouth every 6 (six) hours as needed for moderate pain. Barton Dubois, MD Taking Active Self  ?latanoprost (XALATAN) 0.005 % ophthalmic solution [id number] Yes Place 1 drop into both eyes at bedtime. [provider] Taking Active Self  ?levothyroxine (SYNTHROID) 125 MCG tablet [id number] Yes Take 125 mcg by mouth daily before breakfast.  [provider] Taking Active Self  ?losartan (COZAAR) 100 MG tablet [id number] Yes Take 1 tablet (100 mg total) by mouth daily. TAKE ONE TABLET BY MOUTH AT NOON Strength: 100 mg Cantwell, [name] C, PA-C Taking Active   ?Multiple Vitamins-Minerals (ICAPS AREDS 2 PO) [id number] Yes Take 1 capsule by mouth 2 (two) times daily. [provider] Taking Active Self  ?pantoprazole (PROTONIX) 40 MG tablet [id number] Yes Take 1 tablet (40 mg total) by mouth daily. Take Protonix 40 mg daily after 08/20/2021 when on Eliquis.  ?Patient taking differently: Take 40 mg by mouth 2 (two) times daily. Take Protonix 40 mg daily after 08/20/2021 when on Eliquis.  ? Kayleen Memos, DO Taking Active   ?rOPINIRole (REQUIP) 1 MG  tablet [id number] Yes Take 1 mg by mouth See admin instructions. Take 1 mg by mouth once a day between 3 PM-4 PM and an additional 1 mg in the evening, if no relief [provider] Taking Active Self  ?         ?Med Note [facility], [place] Jun 15, 2021 10:29 PM)    ? ?  ?  ? ?  ? ? ? ?SDOH:  (Social Determinants of Health) assessments and interventions performed: SDOH assessments completed today and documented in the Epic system. ? ?SDOH Interventions   ? ?Flowsheet Row Most Recent Value  ?SDOH Interventions   ?Depression Interventions/Treatment  --  [patient reports her PCP has offered medication but she declined. Patient states she would benefit from speaking to LCSW. Nurse will send referral.]  ? ?  ? ? ?Care Plan ? ?Review of  patient past medical history, allergies, medications, health status, including review of consultants reports, laboratory and other test data, was performed as part of comprehensive evaluation for care management services.  ? ?Care Plan : RN Care Manager Plan of Care  ?Updates made by Michiel Cowboy, RN since 08/10/2021 12:00 AM  ?  ? ?Problem: Knowledge Deficit Related to CHF   ?Priority: High  ?  ? ?Long-Range Goal: Development of Plan of Care for the Management of CHF   ?Start Date: 08/10/2021  ?Expected End Date: 09/08/2022  ?Priority: High  ?Note:   ?Current Barriers:  ?Chronic Disease Management support and education needs related to CHF  ? ?RNCM Clinical Goal(s):  ?Patient will demonstrate Ongoing adherence to prescribed treatment plan for CHF as evidenced by Continuation of weighing daily and recording the weight, adhering to a low sodium diet, monitoring for symptoms of CHF, taking prescribed medications as written, contacting providers for questions and concerns ?work with Education officer, museum to address  related to the management of [place] Concerns  related to the management of feelings of depression as evidenced by review of EMR and patient or social worker report through  collaboration with Consulting civil engineer, provider, and care team.  ? ?Interventions: ?Inter-disciplinary care team collaboration (see longitudinal plan of care) ?Evaluation of current treatment plan related to  self managem

## 2021-08-10 NOTE — Patient Outreach (Signed)
[facility]) Care Management ? ?08/10/2021 ? ?Lisa Mcclure ?[birth year] ?[id number] ? ? ?Referral received from [name], RN for social work Care Management services to assess for depression.  Assigned to Nat Christen, [place] Coordinator follow up. ? ?[name] ?[facility]-Care Management Assistant ?[phone]   ?  ?

## 2021-08-11 ENCOUNTER — Encounter: Payer: Self-pay | Admitting: *Deleted

## 2021-08-11 ENCOUNTER — Other Ambulatory Visit: Payer: Self-pay | Admitting: *Deleted

## 2021-08-11 NOTE — Patient Outreach (Signed)
Care Management Clinical Social Work Note  08/11/2021 Name: Lisa Mcclure MRN: [id number] DOB: Oct 13, 1932  Lisa Mcclure is a 88 y.o. year old female who is a primary care patient of Garlan Fillers, MD.  The Care Management team was consulted for assistance with chronic disease management and coordination needs.  Engaged with patient by telephone for initial visit in response to provider referral for social work chronic care management and care coordination services  Consent to Services:  Lisa Mcclure was given information about Care Management services today including:  Care Management services includes personalized support from designated clinical staff supervised by her physician, including individualized plan of care and coordination with other care providers 24/7 contact phone numbers for assistance for urgent and routine care needs. The patient may stop case management services at any time by phone call to the office staff.  Patient agreed to services and consent obtained.   Assessment: Review of patient past medical history, allergies, medications, and health status, including review of relevant consultants reports was performed today as part of a comprehensive evaluation and provision of chronic care management and care coordination services.  SDOH (Social Determinants of Health) assessments and interventions performed:  SDOH Interventions    Flowsheet Row Most Recent Value  SDOH Interventions   Food Insecurity Interventions Intervention Not Indicated  Financial Strain Interventions Intervention Not Indicated  Housing Interventions Intervention Not Indicated  Intimate Partner Violence Interventions Intervention Not Indicated  Physical Activity Interventions Patient Refused  Stress Interventions Offered Community Wellness Resources, Provide Counseling  Social Connections Interventions Intervention Not Indicated  Transportation Interventions Intervention Not Indicated   Depression Interventions/Treatment  Referral to Psychiatry, Counseling        Advanced Directives Status:  Already in Place.  Care Plan  Allergies  Allergen Reactions   Statins Other (See Comments)    Muscle aches and INTERNAL BLEEDING   Atorvastatin     Other reaction(s): Myalgias   Gabapentin Other (See Comments)    "Made me loopy"   Methocarbamol Other (See Comments)    "Made me loopy"    Outpatient Encounter Medications as of 08/11/2021  Medication Sig Note   albuterol (PROVENTIL) (2.5 MG/3ML) 0.083% nebulizer solution Take 3 mLs (2.5 mg total) by nebulization every 6 (six) hours as needed for wheezing or shortness of breath.    albuterol (VENTOLIN HFA) 108 (90 Base) MCG/ACT inhaler Inhale 2 puffs into the lungs every 6 (six) hours as needed for wheezing or shortness of breath.    amiodarone (PACERONE) 100 MG tablet Take 2 tablets (200 mg total) by mouth daily.    apixaban (ELIQUIS) 2.5 MG TABS tablet Take 1 tablet (2.5 mg total) by mouth 2 (two) times daily. 06/15/2021: Takes at 3 to 4pm and 10 to 1100pm - medication comes from manufacturer   Bempedoic Acid-Ezetimibe (NEXLIZET) 180-10 MG TABS Take 1 capsule by mouth daily. (Patient taking differently: Take 1 capsule by mouth daily. Taking it at bedtime)    budesonide (PULMICORT) 0.25 MG/2ML nebulizer solution Take 2 mLs (0.25 mg total) by nebulization 2 (two) times daily.    carvedilol (COREG) 6.25 MG tablet Take 1 tablet (6.25 mg total) by mouth 2 (two) times daily with a meal for 14 days.    Cholecalciferol (VITAMIN D3) 125 MCG (5000 UT) CAPS Take 5,000 Units by mouth at bedtime.    diclofenac sodium (VOLTAREN) 1 % GEL Apply 2.25 g topically 3 (three) times daily as needed (for pain).     doxycycline (VIBRAMYCIN) 100  MG capsule Take 100 mg by mouth 2 (two) times daily.    ferrous sulfate 325 (65 FE) MG tablet Take 1 tablet (325 mg total) by mouth daily with breakfast.    furosemide (LASIX) 20 MG tablet Take 1 tablet (20 mg  total) by mouth daily as needed for fluid or edema. Take Lasix 20 mg once daily with an additional dose as needed for fluid overload. (Patient taking differently: Take 20 mg by mouth daily. Take 20mg  if needed for fluid overload)    HYDROcodone-acetaminophen (NORCO) 7.5-325 MG tablet Take 1 tablet by mouth every 6 (six) hours as needed for moderate pain.    latanoprost (XALATAN) 0.005 % ophthalmic solution Place 1 drop into both eyes at bedtime.    levothyroxine (SYNTHROID) 125 MCG tablet Take 125 mcg by mouth daily before breakfast.     losartan (COZAAR) 100 MG tablet Take 1 tablet (100 mg total) by mouth daily. TAKE ONE TABLET BY MOUTH AT NOON Strength: 100 mg    Multiple Vitamins-Minerals (ICAPS AREDS 2 PO) Take 1 capsule by mouth 2 (two) times daily.    [START ON 08/21/2021] pantoprazole (PROTONIX) 40 MG tablet Take 1 tablet (40 mg total) by mouth daily. Take Protonix 40 mg daily after 08/20/2021 when on Eliquis. (Patient taking differently: Take 40 mg by mouth 2 (two) times daily. Take Protonix 40 mg daily after 08/20/2021 when on Eliquis.)    rOPINIRole (REQUIP) 1 MG tablet Take 1 mg by mouth See admin instructions. Take 1 mg by mouth once a day between 3 PM-4 PM and an additional 1 mg in the evening, if no relief    No facility-administered encounter medications on file as of 08/11/2021.    Patient Active Problem List   Diagnosis Date Noted   Acute respiratory failure with hypoxia (HCC) 06/15/2021   Paroxysmal atrial fibrillation (HCC) 06/15/2021   Acute renal failure superimposed on stage 3b chronic kidney disease (HCC) 06/15/2021   Generalized weakness 06/15/2021   Atrial fibrillation with RVR (HCC)    Long term (current) use of anticoagulants    Long term current use of antiarrhythmic drug    AAA (abdominal aortic aneurysm) without rupture (HCC) 12/20/2019   AAA (abdominal aortic aneurysm) (HCC) 12/20/2019   Intolerance of continuous positive airway pressure (CPAP) ventilation 11/26/2019    Severe hypoxemia 08/21/2019   Severe obstructive sleep apnea 07/29/2019   Chronic obstructive pulmonary disease (HCC) 07/29/2019   RLS (restless legs syndrome) 07/29/2019   Dehydration with hyponatremia 09/13/2018   Bradycardia 09/13/2018   Glaucoma 09/13/2018   OA (osteoarthritis) of shoulder 05/16/2018   Incomplete emptying of bladder 05/17/2017   Labial adhesions 05/17/2017   Mixed stress and urge urinary incontinence 05/17/2017   Urethral sphincter deficiency, intrinsic (ISD) 05/17/2017   Aortic stenosis, mild 01/15/2016   Bilateral carotid bruits 05/11/2015   Hyponatremia 04/19/2015   Symptomatic anemia 04/19/2015   Panic attack 04/19/2015   Constipation 04/19/2015   Hyperkalemia 04/17/2015   Influenza A 04/16/2015   Proximal humerus fracture    Left patella fracture    Benign essential HTN    Esophageal reflux    Chronic heart failure with preserved ejection fraction (HFpEF) (HCC)    Hypothyroidism    Lower GI bleed 04/08/2015   Hematochezia    Rectal bleeding 04/03/2015   Patellar fracture 03/06/2015   Left humeral fracture 11/04/2013   Rib fracture 11/04/2013   Hypothyroid 11/04/2013   Hypertension 11/04/2013   Right shoulder pain 11/04/2013   OSA (obstructive sleep  apnea) 11/13/2008   BOOP (bronchiolitis obliterans with organizing pneumonia) (HCC) 08/25/2008    Conditions to be addressed/monitored: Hypertension, History of Panic Attacks, and Depressive Symptoms.  Limited Social Support, Level of Care Concerns, ADL/IADL Limitations, Limited Access to Caregiver, and Lacks Knowledge of [facility].  Care Plan : LCSW Plan of Care  Updates made by Karolee Stamps, LCSW since 08/11/2021 12:00 AM     Problem: Reduce and Manage My Symptoms of Depression.   Priority: High     Goal: Reduce and Manage My Symptoms of Depression.   Start Date: 08/11/2021  Expected End Date: 11/11/2021  This Visit's Progress: On track  Priority: High  Note:   Current  Barriers:   Acute Mental Health needs related to History of Panic Attacks and Depressive Symptoms, requires Support, Education, Resources, Referrals, Advocacy, and Care Coordination, in order to meet unmet Acute Mental Health needs. Clinical Goal(s):  Patient will work with LCSW, to reduce and manage symptoms of Depression, until established with a community mental health provider.     Patient will increase knowledge and/or ability of:        Coping Skills, Healthy Habits, Self-Management Skills, Stress Reduction, Home Safety, and Utilizing [employer] and Resources.   Interventions: Collaboration with Primary Care Physician, Dr. Jarome Matin regarding development and update of comprehensive plan of care, as evidenced by provider attestation and co-signature. Inter-disciplinary care team collaboration (see longitudinal plan of care). Clinical Interventions:  Assessed patient's previous treatment, needs, coping skills, current treatment, support system, and barriers to care. PHQ-2 and PHQ-9 Depression Screening Tool performed, and results reviewed with patient. Mindfulness Meditation Strategies, Relaxation Techniques and Deep Breathing Exercises taught, and encouraged daily. Solution-Focused Therapy performed. Verbalization of Feelings encouraged. Emotional Support provided. Problem Solving Solutions developed. Cognitive Behavioral Therapy initiated. Quality of Sleep assessed, and Sleep Hygiene Techniques promoted. Increase Level of Activity/Exercise emphasized.    Discussed plans with patient for ongoing care management follow-up, and provided patient with direct contact information for care management team. Discussed several options for long-term counseling based on need and insurance through [employer] and [employer].     Mailed list of therapists and psychiatrists in [facility] that accept [employer]. Patient Goals/Self-Care Activities: Begin personal  counseling with LCSW, on a bi-weekly basis, to reduce and manage symptoms of Depression, until established with a community mental health provider.   Incorporate into daily practice - relaxation techniques, deep breathing exercises, and mindfulness meditation strategies. Review the following list of therapists and psychiatrists in [facility], mailed to your home on 08/11/2021, and begin contacting resources of interest, in an effort to establish counseling and supportive services: ~ Mental Health Resources ~ Mental Health & Substance Abuse Resources ~ List of Therapists & Psychiatrists  Continue to receive in-home care services, 2 days per week, through hired help.    Continue to receive house cleaning and laundry services, every Saturday. Continue to have prescriptions delivered through [facility], and have daughter assist with pre-filling medication box for entire month. Continue to order groceries on-line, and have delivered directly to your home, through [facility]. Continue to wear 2 liters of oxygen per nasal cannula, at bedtime, provided through [facility]. Continue to receive home health physical therapy, twice per week, through Leonel Ramsay, Home Health Physical Therapist with [facility]. LCSW collaboration with Primary Care Physician, Dr. Jarome Matin, to request order for home health skilled nursing services, as well as a home health bath aide. Continue to wear Guardian Alert Device, in  the event of a fall or medical emergency. Contact LCSW directly (# [phone]), if you have questions, need assistance, or if additional social work needs are identified between now and our next scheduled telephone outreach call.  Follow-Up:  08/23/2021 at 2:15 pm   Danford Bad, BSW, MSW, LCSW  Licensed Clinical Social Worker  [facility]  Mailing [place]. [address], [place], [place] [address] Physical Address-[address],  [place], [place] [address] Toll Free Main # [phone] Fax # [phone] Cell # [phone] [name]

## 2021-08-12 DIAGNOSIS — I130 Hypertensive heart and chronic kidney disease with heart failure and stage 1 through stage 4 chronic kidney disease, or unspecified chronic kidney disease: Secondary | ICD-10-CM | POA: Diagnosis not present

## 2021-08-12 DIAGNOSIS — D509 Iron deficiency anemia, unspecified: Secondary | ICD-10-CM | POA: Diagnosis not present

## 2021-08-12 DIAGNOSIS — H353114 Nonexudative age-related macular degeneration, right eye, advanced atrophic with subfoveal involvement: Secondary | ICD-10-CM | POA: Diagnosis not present

## 2021-08-12 DIAGNOSIS — H43813 Vitreous degeneration, bilateral: Secondary | ICD-10-CM | POA: Diagnosis not present

## 2021-08-12 DIAGNOSIS — J9602 Acute respiratory failure with hypercapnia: Secondary | ICD-10-CM | POA: Diagnosis not present

## 2021-08-12 DIAGNOSIS — I5032 Chronic diastolic (congestive) heart failure: Secondary | ICD-10-CM | POA: Diagnosis not present

## 2021-08-12 DIAGNOSIS — J9601 Acute respiratory failure with hypoxia: Secondary | ICD-10-CM | POA: Diagnosis not present

## 2021-08-12 DIAGNOSIS — N1832 Chronic kidney disease, stage 3b: Secondary | ICD-10-CM | POA: Diagnosis not present

## 2021-08-12 DIAGNOSIS — H353221 Exudative age-related macular degeneration, left eye, with active choroidal neovascularization: Secondary | ICD-10-CM | POA: Diagnosis not present

## 2021-08-12 DIAGNOSIS — H43393 Other vitreous opacities, bilateral: Secondary | ICD-10-CM | POA: Diagnosis not present

## 2021-08-12 DIAGNOSIS — H35033 Hypertensive retinopathy, bilateral: Secondary | ICD-10-CM | POA: Diagnosis not present

## 2021-08-13 DIAGNOSIS — J9601 Acute respiratory failure with hypoxia: Secondary | ICD-10-CM | POA: Diagnosis not present

## 2021-08-13 DIAGNOSIS — I130 Hypertensive heart and chronic kidney disease with heart failure and stage 1 through stage 4 chronic kidney disease, or unspecified chronic kidney disease: Secondary | ICD-10-CM | POA: Diagnosis not present

## 2021-08-13 DIAGNOSIS — N1832 Chronic kidney disease, stage 3b: Secondary | ICD-10-CM | POA: Diagnosis not present

## 2021-08-13 DIAGNOSIS — D509 Iron deficiency anemia, unspecified: Secondary | ICD-10-CM | POA: Diagnosis not present

## 2021-08-13 DIAGNOSIS — I5032 Chronic diastolic (congestive) heart failure: Secondary | ICD-10-CM | POA: Diagnosis not present

## 2021-08-13 DIAGNOSIS — J9602 Acute respiratory failure with hypercapnia: Secondary | ICD-10-CM | POA: Diagnosis not present

## 2021-08-14 DIAGNOSIS — N1832 Chronic kidney disease, stage 3b: Secondary | ICD-10-CM | POA: Diagnosis not present

## 2021-08-14 DIAGNOSIS — J9601 Acute respiratory failure with hypoxia: Secondary | ICD-10-CM | POA: Diagnosis not present

## 2021-08-14 DIAGNOSIS — I130 Hypertensive heart and chronic kidney disease with heart failure and stage 1 through stage 4 chronic kidney disease, or unspecified chronic kidney disease: Secondary | ICD-10-CM | POA: Diagnosis not present

## 2021-08-14 DIAGNOSIS — I5032 Chronic diastolic (congestive) heart failure: Secondary | ICD-10-CM | POA: Diagnosis not present

## 2021-08-14 DIAGNOSIS — D509 Iron deficiency anemia, unspecified: Secondary | ICD-10-CM | POA: Diagnosis not present

## 2021-08-14 DIAGNOSIS — J9602 Acute respiratory failure with hypercapnia: Secondary | ICD-10-CM | POA: Diagnosis not present

## 2021-08-17 DIAGNOSIS — D509 Iron deficiency anemia, unspecified: Secondary | ICD-10-CM | POA: Diagnosis not present

## 2021-08-17 DIAGNOSIS — N1832 Chronic kidney disease, stage 3b: Secondary | ICD-10-CM | POA: Diagnosis not present

## 2021-08-17 DIAGNOSIS — I130 Hypertensive heart and chronic kidney disease with heart failure and stage 1 through stage 4 chronic kidney disease, or unspecified chronic kidney disease: Secondary | ICD-10-CM | POA: Diagnosis not present

## 2021-08-17 DIAGNOSIS — I5032 Chronic diastolic (congestive) heart failure: Secondary | ICD-10-CM | POA: Diagnosis not present

## 2021-08-17 DIAGNOSIS — J9602 Acute respiratory failure with hypercapnia: Secondary | ICD-10-CM | POA: Diagnosis not present

## 2021-08-17 DIAGNOSIS — J9601 Acute respiratory failure with hypoxia: Secondary | ICD-10-CM | POA: Diagnosis not present

## 2021-08-18 DIAGNOSIS — J9601 Acute respiratory failure with hypoxia: Secondary | ICD-10-CM | POA: Diagnosis not present

## 2021-08-18 DIAGNOSIS — I130 Hypertensive heart and chronic kidney disease with heart failure and stage 1 through stage 4 chronic kidney disease, or unspecified chronic kidney disease: Secondary | ICD-10-CM | POA: Diagnosis not present

## 2021-08-18 DIAGNOSIS — I5032 Chronic diastolic (congestive) heart failure: Secondary | ICD-10-CM | POA: Diagnosis not present

## 2021-08-18 DIAGNOSIS — D509 Iron deficiency anemia, unspecified: Secondary | ICD-10-CM | POA: Diagnosis not present

## 2021-08-18 DIAGNOSIS — N1832 Chronic kidney disease, stage 3b: Secondary | ICD-10-CM | POA: Diagnosis not present

## 2021-08-18 DIAGNOSIS — J9602 Acute respiratory failure with hypercapnia: Secondary | ICD-10-CM | POA: Diagnosis not present

## 2021-08-19 ENCOUNTER — Telehealth: Payer: Self-pay

## 2021-08-19 NOTE — Telephone Encounter (Signed)
Beth, a nurse with [facility] home care, called and stated that the pt has been more swollen than usual. Pt has a pink tint to her legs and has gained about 3-4 pounds. Per Lawerance Cruel PA the pt will be taking an extra dose of lasix prn and will give [place] a call if the swelling does not go down. ?

## 2021-08-21 DIAGNOSIS — I5032 Chronic diastolic (congestive) heart failure: Secondary | ICD-10-CM | POA: Diagnosis not present

## 2021-08-21 DIAGNOSIS — I130 Hypertensive heart and chronic kidney disease with heart failure and stage 1 through stage 4 chronic kidney disease, or unspecified chronic kidney disease: Secondary | ICD-10-CM | POA: Diagnosis not present

## 2021-08-21 DIAGNOSIS — N1832 Chronic kidney disease, stage 3b: Secondary | ICD-10-CM | POA: Diagnosis not present

## 2021-08-21 DIAGNOSIS — J9602 Acute respiratory failure with hypercapnia: Secondary | ICD-10-CM | POA: Diagnosis not present

## 2021-08-21 DIAGNOSIS — D509 Iron deficiency anemia, unspecified: Secondary | ICD-10-CM | POA: Diagnosis not present

## 2021-08-21 DIAGNOSIS — J9601 Acute respiratory failure with hypoxia: Secondary | ICD-10-CM | POA: Diagnosis not present

## 2021-08-22 DIAGNOSIS — M25512 Pain in left shoulder: Secondary | ICD-10-CM | POA: Diagnosis not present

## 2021-08-22 DIAGNOSIS — N1832 Chronic kidney disease, stage 3b: Secondary | ICD-10-CM | POA: Diagnosis not present

## 2021-08-22 DIAGNOSIS — D631 Anemia in chronic kidney disease: Secondary | ICD-10-CM | POA: Diagnosis not present

## 2021-08-22 DIAGNOSIS — I5032 Chronic diastolic (congestive) heart failure: Secondary | ICD-10-CM | POA: Diagnosis not present

## 2021-08-22 DIAGNOSIS — E039 Hypothyroidism, unspecified: Secondary | ICD-10-CM | POA: Diagnosis not present

## 2021-08-22 DIAGNOSIS — K219 Gastro-esophageal reflux disease without esophagitis: Secondary | ICD-10-CM | POA: Diagnosis not present

## 2021-08-22 DIAGNOSIS — J9601 Acute respiratory failure with hypoxia: Secondary | ICD-10-CM | POA: Diagnosis not present

## 2021-08-22 DIAGNOSIS — D509 Iron deficiency anemia, unspecified: Secondary | ICD-10-CM | POA: Diagnosis not present

## 2021-08-22 DIAGNOSIS — M25511 Pain in right shoulder: Secondary | ICD-10-CM | POA: Diagnosis not present

## 2021-08-22 DIAGNOSIS — R1313 Dysphagia, pharyngeal phase: Secondary | ICD-10-CM | POA: Diagnosis not present

## 2021-08-22 DIAGNOSIS — I130 Hypertensive heart and chronic kidney disease with heart failure and stage 1 through stage 4 chronic kidney disease, or unspecified chronic kidney disease: Secondary | ICD-10-CM | POA: Diagnosis not present

## 2021-08-22 DIAGNOSIS — M1990 Unspecified osteoarthritis, unspecified site: Secondary | ICD-10-CM | POA: Diagnosis not present

## 2021-08-22 DIAGNOSIS — J9602 Acute respiratory failure with hypercapnia: Secondary | ICD-10-CM | POA: Diagnosis not present

## 2021-08-22 DIAGNOSIS — K259 Gastric ulcer, unspecified as acute or chronic, without hemorrhage or perforation: Secondary | ICD-10-CM | POA: Diagnosis not present

## 2021-08-22 DIAGNOSIS — M069 Rheumatoid arthritis, unspecified: Secondary | ICD-10-CM | POA: Diagnosis not present

## 2021-08-22 DIAGNOSIS — I480 Paroxysmal atrial fibrillation: Secondary | ICD-10-CM | POA: Diagnosis not present

## 2021-08-22 DIAGNOSIS — F32A Depression, unspecified: Secondary | ICD-10-CM | POA: Diagnosis not present

## 2021-08-22 DIAGNOSIS — K5790 Diverticulosis of intestine, part unspecified, without perforation or abscess without bleeding: Secondary | ICD-10-CM | POA: Diagnosis not present

## 2021-08-22 DIAGNOSIS — G4733 Obstructive sleep apnea (adult) (pediatric): Secondary | ICD-10-CM | POA: Diagnosis not present

## 2021-08-22 DIAGNOSIS — I872 Venous insufficiency (chronic) (peripheral): Secondary | ICD-10-CM | POA: Diagnosis not present

## 2021-08-22 DIAGNOSIS — I714 Abdominal aortic aneurysm, without rupture, unspecified: Secondary | ICD-10-CM | POA: Diagnosis not present

## 2021-08-22 DIAGNOSIS — J45909 Unspecified asthma, uncomplicated: Secondary | ICD-10-CM | POA: Diagnosis not present

## 2021-08-22 DIAGNOSIS — G4700 Insomnia, unspecified: Secondary | ICD-10-CM | POA: Diagnosis not present

## 2021-08-22 DIAGNOSIS — G2581 Restless legs syndrome: Secondary | ICD-10-CM | POA: Diagnosis not present

## 2021-08-22 DIAGNOSIS — N3281 Overactive bladder: Secondary | ICD-10-CM | POA: Diagnosis not present

## 2021-08-23 ENCOUNTER — Other Ambulatory Visit: Payer: Self-pay | Admitting: *Deleted

## 2021-08-23 DIAGNOSIS — J9602 Acute respiratory failure with hypercapnia: Secondary | ICD-10-CM | POA: Diagnosis not present

## 2021-08-23 DIAGNOSIS — I130 Hypertensive heart and chronic kidney disease with heart failure and stage 1 through stage 4 chronic kidney disease, or unspecified chronic kidney disease: Secondary | ICD-10-CM | POA: Diagnosis not present

## 2021-08-23 DIAGNOSIS — J9601 Acute respiratory failure with hypoxia: Secondary | ICD-10-CM | POA: Diagnosis not present

## 2021-08-23 DIAGNOSIS — I5032 Chronic diastolic (congestive) heart failure: Secondary | ICD-10-CM | POA: Diagnosis not present

## 2021-08-23 DIAGNOSIS — K259 Gastric ulcer, unspecified as acute or chronic, without hemorrhage or perforation: Secondary | ICD-10-CM | POA: Diagnosis not present

## 2021-08-23 DIAGNOSIS — N1832 Chronic kidney disease, stage 3b: Secondary | ICD-10-CM | POA: Diagnosis not present

## 2021-08-23 NOTE — Patient Outreach (Signed)
Care Management ?Clinical Social Work Note ? ?08/23/2021 ?Name: Lisa Mcclure MRN: [id number] DOB: [birth year] ? ?Lisa Mcclure is a 88 y.o. year old female who is a primary care patient of [name], MD.  The Care Management team was consulted for assistance with chronic disease management and coordination needs. ? ?Engaged with patient by telephone for follow up visit in response to provider referral for social work chronic care management and care coordination services ? ?Consent to Services:  ?[name] was given information about Care Management services today including:  ?Care Management services includes personalized support from designated clinical staff supervised by her physician, including individualized plan of care and coordination with other care providers ?24/7 contact phone numbers for assistance for urgent and routine care needs. ?The patient may stop case management services at any time by phone call to the office staff. ? ?Patient agreed to services and consent obtained.  ? ?Assessment: Review of patient past medical history, allergies, medications, and health status, including review of relevant consultants reports was performed today as part of a comprehensive evaluation and provision of chronic care management and care coordination services. ? ?SDOH (Social Determinants of Health) assessments and interventions performed:   ? ?Advanced Directives Status: Not addressed in this encounter. ? ?Care Plan ? ?Allergies  ?Allergen Reactions  ? Statins Other (See Comments)  ?  Muscle aches and INTERNAL BLEEDING  ? Atorvastatin   ?  Other reaction(s): Myalgias  ? Gabapentin Other (See Comments)  ?  "Made me loopy"  ? Methocarbamol Other (See Comments)  ?  "Made me loopy"  ? ? ?Outpatient Encounter Medications as of 08/23/2021  ?Medication Sig Note  ? albuterol (PROVENTIL) (2.5 MG/3ML) 0.083% nebulizer solution Take 3 mLs (2.5 mg total) by nebulization every 6 (six) hours as needed for  wheezing or shortness of breath.   ? albuterol (VENTOLIN HFA) 108 (90 Base) MCG/ACT inhaler Inhale 2 puffs into the lungs every 6 (six) hours as needed for wheezing or shortness of breath.   ? amiodarone (PACERONE) 100 MG tablet Take 2 tablets (200 mg total) by mouth daily.   ? apixaban (ELIQUIS) 2.5 MG TABS tablet Take 1 tablet (2.5 mg total) by mouth 2 (two) times daily. 06/15/2021: Takes at 3 to 4pm and 10 to 1100pm - medication comes from manufacturer  ? Bempedoic Acid-Ezetimibe (NEXLIZET) 180-10 MG TABS Take 1 capsule by mouth daily. (Patient taking differently: Take 1 capsule by mouth daily. Taking it at bedtime)   ? budesonide (PULMICORT) 0.25 MG/2ML nebulizer solution Take 2 mLs (0.25 mg total) by nebulization 2 (two) times daily.   ? carvedilol (COREG) 6.25 MG tablet Take 1 tablet (6.25 mg total) by mouth 2 (two) times daily with a meal for 14 days.   ? Cholecalciferol (VITAMIN D3) 125 MCG (5000 UT) CAPS Take 5,000 Units by mouth at bedtime.   ? diclofenac sodium (VOLTAREN) 1 % GEL Apply 2.25 g topically 3 (three) times daily as needed (for pain).    ? doxycycline (VIBRAMYCIN) 100 MG capsule Take 100 mg by mouth 2 (two) times daily.   ? ferrous sulfate 325 (65 FE) MG tablet Take 1 tablet (325 mg total) by mouth daily with breakfast.   ? furosemide (LASIX) 20 MG tablet Take 1 tablet (20 mg total) by mouth daily as needed for fluid or edema. Take Lasix 20 mg once daily with an additional dose as needed for fluid overload. (Patient taking differently: Take 20 mg by mouth daily. Take '20mg'$   if needed for fluid overload)   ? HYDROcodone-acetaminophen (NORCO) 7.5-325 MG tablet Take 1 tablet by mouth every 6 (six) hours as needed for moderate pain.   ? latanoprost (XALATAN) 0.005 % ophthalmic solution Place 1 drop into both eyes at bedtime.   ? levothyroxine (SYNTHROID) 125 MCG tablet Take 125 mcg by mouth daily before breakfast.    ? losartan (COZAAR) 100 MG tablet Take 1 tablet (100 mg total) by mouth daily. TAKE  ONE TABLET BY MOUTH AT NOON Strength: 100 mg   ? Multiple Vitamins-Minerals (ICAPS AREDS 2 PO) Take 1 capsule by mouth 2 (two) times daily.   ? pantoprazole (PROTONIX) 40 MG tablet Take 1 tablet (40 mg total) by mouth daily. Take Protonix 40 mg daily after 08/20/2021 when on Eliquis. (Patient taking differently: Take 40 mg by mouth 2 (two) times daily. Take Protonix 40 mg daily after 08/20/2021 when on Eliquis.)   ? rOPINIRole (REQUIP) 1 MG tablet Take 1 mg by mouth See admin instructions. Take 1 mg by mouth once a day between 3 PM-4 PM and an additional 1 mg in the evening, if no relief   ? ?No facility-administered encounter medications on file as of 08/23/2021.  ? ? ?Patient Active Problem List  ? Diagnosis Date Noted  ? Acute respiratory failure with hypoxia ([place]) 06/15/2021  ? Paroxysmal atrial fibrillation ([place]) 06/15/2021  ? Acute renal failure superimposed on stage 3b chronic kidney disease ([place]) 06/15/2021  ? Generalized weakness 06/15/2021  ? Atrial fibrillation with RVR ([place])   ? Long term (current) use of anticoagulants   ? Long term current use of antiarrhythmic drug   ? AAA (abdominal aortic aneurysm) without rupture ([place]) 12/20/2019  ? AAA (abdominal aortic aneurysm) ([place]) 12/20/2019  ? Intolerance of continuous positive airway pressure (CPAP) ventilation 11/26/2019  ? Severe hypoxemia 08/21/2019  ? Severe obstructive sleep apnea 07/29/2019  ? Chronic obstructive pulmonary disease ([place]) 07/29/2019  ? RLS (restless legs syndrome) 07/29/2019  ? Dehydration with hyponatremia 09/13/2018  ? Bradycardia 09/13/2018  ? Glaucoma 09/13/2018  ? OA (osteoarthritis) of shoulder 05/16/2018  ? Incomplete emptying of bladder 05/17/2017  ? Labial adhesions 05/17/2017  ? Mixed stress and urge urinary incontinence 05/17/2017  ? Urethral sphincter deficiency, intrinsic (ISD) 05/17/2017  ? Aortic stenosis, mild 01/15/2016  ? Bilateral carotid bruits 05/11/2015  ? Hyponatremia 04/19/2015  ? Symptomatic anemia 04/19/2015  ?  Panic attack 04/19/2015  ? Constipation 04/19/2015  ? Hyperkalemia 04/17/2015  ? Influenza A 04/16/2015  ? Proximal humerus fracture   ? Left patella fracture   ? Benign essential HTN   ? Esophageal reflux   ? Chronic heart failure with preserved ejection fraction (HFpEF) ([place])   ? Hypothyroidism   ? Lower GI bleed 04/08/2015  ? Hematochezia   ? Rectal bleeding 04/03/2015  ? Patellar fracture 03/06/2015  ? Left humeral fracture 11/04/2013  ? Rib fracture 11/04/2013  ? Hypothyroid 11/04/2013  ? Hypertension 11/04/2013  ? Right shoulder pain 11/04/2013  ? OSA (obstructive sleep apnea) 11/13/2008  ? BOOP (bronchiolitis obliterans with organizing pneumonia) ([place]) 08/25/2008  ? ? ?Conditions to be addressed/monitored: Depression.  Limited Social Support, Level of Care Concerns, ADL/IADL Limitations, Mental Health Concerns, Social Isolation, Limited Access to Caregiver, and Lacks Knowledge of [employer]. ? ?Care Plan : LCSW Plan of Care  ?Updates made by Francis Gaines, LCSW since 08/23/2021 12:00 AM  ?  ? ?Problem: Reduce and Manage My Symptoms of Depression.   ?Priority: High  ?  ? ?  Goal: Reduce and Manage My Symptoms of Depression.   ?Start Date: 08/11/2021  ?Expected End Date: 11/11/2021  ?This Visit's Progress: On track  ?Recent Progress: On track  ?Priority: High  ?Note:   ?Current Barriers:   ?Acute Mental Health needs related to History of Panic Attacks and Depressive Symptoms, requires Support, Education, Resources, Referrals, Advocacy, and Care Coordination, in order to meet unmet Acute Mental Health needs. ?Clinical Goal(s):  ?Patient will work with LCSW, to reduce and manage symptoms of Depression, until established with a community mental health provider.     ?Patient will increase knowledge and/or ability of:  ?      Coping Skills, Healthy Habits, Self-Management Skills, Stress Reduction, Home Safety, and Utilizing [employer] and Resources.   ?Interventions: ?Collaboration with Primary Care  Physician, Dr. [name] regarding development and update of comprehensive plan of care, as evidenced by provider attestation and co-signature. ?Inter-disciplinary care team collaboration (see longitudi

## 2021-08-24 DIAGNOSIS — K259 Gastric ulcer, unspecified as acute or chronic, without hemorrhage or perforation: Secondary | ICD-10-CM | POA: Diagnosis not present

## 2021-08-24 DIAGNOSIS — J9602 Acute respiratory failure with hypercapnia: Secondary | ICD-10-CM | POA: Diagnosis not present

## 2021-08-24 DIAGNOSIS — I5032 Chronic diastolic (congestive) heart failure: Secondary | ICD-10-CM | POA: Diagnosis not present

## 2021-08-24 DIAGNOSIS — J9601 Acute respiratory failure with hypoxia: Secondary | ICD-10-CM | POA: Diagnosis not present

## 2021-08-24 DIAGNOSIS — I130 Hypertensive heart and chronic kidney disease with heart failure and stage 1 through stage 4 chronic kidney disease, or unspecified chronic kidney disease: Secondary | ICD-10-CM | POA: Diagnosis not present

## 2021-08-24 DIAGNOSIS — N1832 Chronic kidney disease, stage 3b: Secondary | ICD-10-CM | POA: Diagnosis not present

## 2021-08-26 ENCOUNTER — Encounter: Payer: Self-pay | Admitting: Student

## 2021-08-26 ENCOUNTER — Ambulatory Visit: Payer: Medicare Other | Admitting: Student

## 2021-08-26 VITALS — BP 134/47 | HR 62 | Temp 98.4°F | Resp 17 | Ht 63.5 in | Wt 211.0 lb

## 2021-08-26 DIAGNOSIS — I5032 Chronic diastolic (congestive) heart failure: Secondary | ICD-10-CM

## 2021-08-26 DIAGNOSIS — I480 Paroxysmal atrial fibrillation: Secondary | ICD-10-CM | POA: Diagnosis not present

## 2021-08-26 MED ORDER — METOLAZONE 2.5 MG PO TABS
2.5000 mg | ORAL_TABLET | ORAL | 0 refills | Status: DC | PRN
Start: 2021-08-26 — End: 2022-04-15

## 2021-08-26 NOTE — Progress Notes (Signed)
Primary Physician/Referring:  [name], MD  [name], female    DOB: [birth year], 88 y.o.   MRN: [id number]  Chief Complaint  Patient presents with   HFpEF   Hypertension    8 weeks   Leg sweilling   HPI:    Lisa Mcclure  is a 88 y.o.  female  with past medical history of obesity, chronic diastolic CHF, hypertensive heart disease, paroxysmal atrial fibrillation, anemia, hemorrhoids, diverticulosis, esophageal reflux, depression, OSA intolerant to CPAP on nocturnal 02, rheumatoid arthritis, and venous insufficiency. CT scan of the chest 03/2016 without definitive ILD. LM and 2 vessel CAD, mild calcification on mitral and aortic valve. S/p AAA repair on 12/20/2019.  Her last episode of atrial fibrillation was in August 2021.    Patient admitted 06/15/21-06/21/21 with acute respiratory failure. Evaluation revealed anemia requiring transfusion, likely secondary to gastric ulcer.   Patient called the office a few days ago with concerns of increasing bilateral leg edema.  She was advised to increase Lasix to 40 mg p.o. twice daily, which she has been taking for the last 3 days with minimal improvement of leg edema.  Denies orthopnea, significant dyspnea on exertion.  Denies chest pain.  Her weight remains stable.  She continues to struggle with weight loss, but does report "being careful with sodium".  Past Medical History:  Diagnosis Date   Anemia    h/o hemorrhoidal bleeding and blood transfusion   Chronic diastolic CHF (congestive heart failure) (HCC)    Depression    Diverticulosis    DOE (dyspnea on exertion)    Esophageal reflux    GERD (gastroesophageal reflux disease)    Hypothyroidism    Insomnia    LBP (low back pain)    OAB (overactive bladder)    Obesity    OSA (obstructive sleep apnea)    Osteoarthritis    Paroxysmal atrial fibrillation (HCC)    Rheumatoid arthritis(714.0)    Shoulder pain, bilateral    Unspecified essential  hypertension    Venous insufficiency    Past Surgical History:  Procedure Laterality Date   ABDOMINAL AORTIC ENDOVASCULAR STENT GRAFT N/A 12/20/2019   Procedure: Aortogram including catheter selection of aorta and bilateral iliac arteriogram, Endovascular repair of infrarenal abdominal aortic aneurysm with bifurcated stent graft (26 mm x 14 x 12 main body, right bell bottom with a 20 mm x 10 cm piece, and left bell bottom with a 16 mm x 12 cm piece) ;  Surgeon: Marty Heck, MD;  Location: [facility] [place] OR;  Service: Vascular;  Laterality: N/A;   [place]   BIOPSY  06/18/2021   Procedure: BIOPSY;  Surgeon: Ronnette Juniper, MD;  Location: [facility] ENDOSCOPY;  Service: Gastroenterology;;   bladder abduction-1996  1996   breast biopsy Right 1980   CARDIOVERSION N/A 12/24/2019   Procedure: CARDIOVERSION;  Surgeon: Adrian Prows, MD;  Location: [facility] ENDOSCOPY;  Service: Cardiovascular;  Laterality: N/A;   [place]     ESOPHAGOGASTRODUODENOSCOPY (EGD) WITH PROPOFOL N/A 06/18/2021   Procedure: ESOPHAGOGASTRODUODENOSCOPY (EGD) WITH PROPOFOL;  Surgeon: Ronnette Juniper, MD;  Location: [facility] ENDOSCOPY;  Service: Gastroenterology;  Laterality: N/A;   FLEXIBLE SIGMOIDOSCOPY N/A 04/10/2015   Procedure: FLEXIBLE SIGMOIDOSCOPY;  Surgeon: Arta Silence, MD;  Location: [facility] ENDOSCOPY;  Service: Endoscopy;  Laterality: N/A;   knee arthroscopy Right 1996, 2010   KNEE ARTHROSCOPY W/ AUTOGENOUS CARTILAGE IMPLANTATION (  ACI) PROCEDURE Left 1994, 1995   REFRACTIVE SURGERY  01/2020   SHOULDER SURGERY  1990   TEE WITHOUT CARDIOVERSION N/A 12/24/2019   Procedure: TRANSESOPHAGEAL ECHOCARDIOGRAM (TEE);  Surgeon: Adrian Prows, MD;  Location: [place];  Service: Cardiovascular;  Laterality: N/A;   [place] Bilateral 12/20/2019   Procedure: Ultrasound-guided access of bilateral common femoral  arteries for delivery of endograft and percutaneous closure;  Surgeon: Marty Heck, MD;  Location: [facility] OR;  Service: Vascular;  Laterality: Bilateral;   VESICOVAGINAL FISTULA CLOSURE W/ TAH     WRIST SURGERY  1967   Family History  Problem Relation Age of Onset   COPD Father    Lung cancer Mother    Heart attack Maternal Grandmother 35   Breast cancer Maternal Aunt    Lung cancer Son     Social History   Tobacco Use   Smoking status: Former    Packs/day: 0.20    Years: 10.00    Pack years: 2.00    Types: Cigarettes    Quit date: 04/11/1984    Years since quitting: 37.4    Passive exposure: Past   Smokeless tobacco: Never  Substance Use Topics   Alcohol use: Yes    Alcohol/week: 0.0 standard drinks    Comment: cocktail once a week   Marital Status: Widowed  ROS  Review of Systems  Cardiovascular:  Positive for dyspnea on exertion (mild) and leg swelling (worse for the last several days). Negative for chest pain, orthopnea, palpitations, paroxysmal nocturnal dyspnea and syncope.  Respiratory:  Negative for cough, hemoptysis and wheezing.   Genitourinary:  Positive for bladder incontinence and urgency.   Objective  Blood pressure (!) 134/47, pulse 62, temperature 98.4 F (36.9 C), temperature source Temporal, resp. rate 17, height 5' 3.5" (1.613 m), weight 211 lb (95.7 kg), SpO2 94 %.     08/26/2021   12:53 PM 07/27/2021    1:35 PM 07/07/2021    1:35 PM  Vitals with BMI  Height 5' 3.5" 5' 3.5" 5' 3"   Weight 211 lbs 210 lbs 209 lbs  BMI 36.79 80.32 12.24  Systolic 825 003 704  Diastolic 47 76 74  Pulse 62 65 60     Physical Exam Vitals reviewed.  Constitutional:      General: She is not in acute distress.    Appearance: She is well-developed.  Cardiovascular:     Rate and Rhythm: Normal rate and regular rhythm. No extrasystoles are present.    Pulses: Intact distal pulses.          Carotid pulses are 2+ on the right side and 2+ on the left side.      Radial  pulses are 2+ on the right side and 2+ on the left side.     Heart sounds: S1 normal and S2 normal. Murmur heard.  Harsh midsystolic murmur is present with a grade of 3/6 at the upper right sternal border radiating to the apex.    No gallop.     Comments: No JVD.  Pulmonary:     Effort: Pulmonary effort is normal. No accessory muscle usage or respiratory distress.     Breath sounds: No wheezing or rhonchi.  Musculoskeletal:     Right lower leg: Edema (1-2+) present.     Left lower leg: Edema (1-2+) present.  Neurological:     Mental Status: She is alert.   Laboratory examination:   Recent Labs  06/15/21 1712 06/16/21 0644 06/20/21 0536  NA 137 136 138  K 4.2 4.0 4.3  CL 93* 95* 93*  CO2 33* 32 37*  GLUCOSE 98 240* 100*  BUN 52* 50* 36*  CREATININE 2.00* 1.59* 1.28*  CALCIUM 8.7* 8.7* 8.5*  GFRNONAA 24* 31* 41*   CrCl cannot be calculated (Patient's most recent lab result is older than the maximum 21 days allowed.).     Latest Ref Rng & Units 06/20/2021    5:36 AM 06/16/2021    6:44 AM 06/15/2021    5:12 PM  CMP  Glucose 70 - 99 mg/dL 100   240   98    BUN 8 - 23 mg/dL 36   50   52    Creatinine 0.44 - 1.00 mg/dL 1.28   1.59   2.00    Sodium 135 - 145 mmol/L 138   136   137    Potassium 3.5 - 5.1 mmol/L 4.3   4.0   4.2    Chloride 98 - 111 mmol/L 93   95   93    CO2 22 - 32 mmol/L 37   32   33    Calcium 8.9 - 10.3 mg/dL 8.5   8.7   8.7        Latest Ref Rng & Units 06/21/2021    4:47 AM 06/20/2021    5:24 AM 06/18/2021    4:38 AM  CBC  WBC 4.0 - 10.5 K/uL 7.6   7.4   8.7    Hemoglobin 12.0 - 15.0 g/dL 8.7   8.7   8.5    Hematocrit 36.0 - 46.0 % 29.4   29.6   28.0    Platelets 150 - 400 K/uL 376   377   367     Lipid Panel    Component Value Date/Time   CHOL 126 03/14/2019 1121   TRIG 82 03/14/2019 1121   HDL 49 03/14/2019 1121   CHOLHDL 5.1 CALC 12/19/2006 0916   VLDL 26 12/19/2006 0916   LDLCALC 61 03/14/2019 1121   LDLDIRECT 159.2 12/19/2006 0916    HEMOGLOBIN A1C No results found for: HGBA1C, MPG TSH No results for input(s): TSH in the last 8760 hours.  External labs:  02/22/2021: BUN 36, creatinine 1.3, GFR 39, sodium 130, potassium 5.4 Hgb 11.3, HCT 35.1, platelet 336 Magnesium 2.4  07/20/2020: Glucose 84, BUN 30, creatinine 1.22, GFR 48, sodium 137, potassium 4.9,  BNP 321  Cholesterol, total 164.000 m 06/21/2019 HDL 46 MG/DL 06/21/2019 LDL 99.000 mg 06/21/2019 Triglycerides 93.000 06/21/2019  04/14/2020: Serum glucose 86 mg, BUN 22, creatinine 1.13, EGFR 44 mL, sodium 138, potassium 4.3.  CMP otherwise normal. Hb 10.2/HCT 31.5, platelets 383.  02/17/2020:  Hemoglobin 11.1, hematocrit 34.2, platelets 383 BUN 26, creatinine 0.9, EGFR 59, sodium 136, potassium 4.6  01/07/2020: BUN 29, GFR 57, creatinine 0.93, BUN/CR 31, sodium 133, potassium 4.6, chloride 93, BMP otherwise normal  Cholesterol, total 164.000 m 06/21/2019 Triglycerides 93.000 06/21/2019 HDL 46 MG/DL 06/21/2019 LDL 99.000 mg 06/21/2019  Glucose Random 87.000 mg 06/21/2019 BUN 25.000 mg 06/21/2019 Creatinine, Serum 0.800 mg/ 06/21/2019  TSH 3.220 micr 06/21/2019  AST 18, ALT 11 09/14/2018  FOBT: Normal 04/24/2018  MicroAlbumin Urine 13.000 03/23/2017 MicroAlbumin/Creat 37.9 MG/ 03/23/2017  Allergies   Allergies  Allergen Reactions   Statins Other (See Comments)    Muscle aches and INTERNAL BLEEDING   Atorvastatin     Other reaction(s): Myalgias   Gabapentin Other (See Comments)    "  Made me loopy"   Methocarbamol Other (See Comments)    "Made me loopy"    Medications Prior to Visit:   Outpatient Medications Prior to Visit  Medication Sig Dispense Refill   albuterol (PROVENTIL) (2.5 MG/3ML) 0.083% nebulizer solution Take 3 mLs (2.5 mg total) by nebulization every 6 (six) hours as needed for wheezing or shortness of breath. 75 mL 12   albuterol (VENTOLIN HFA) 108 (90 Base) MCG/ACT inhaler Inhale 2 puffs into the lungs every 6 (six) hours as  needed for wheezing or shortness of breath. 8 g 2   amiodarone (PACERONE) 100 MG tablet Take 2 tablets (200 mg total) by mouth daily. 90 tablet 2   apixaban (ELIQUIS) 2.5 MG TABS tablet Take 1 tablet (2.5 mg total) by mouth 2 (two) times daily. 180 tablet 3   Bempedoic Acid-Ezetimibe (NEXLIZET) 180-10 MG TABS Take 1 capsule by mouth daily. (Patient taking differently: Take 1 capsule by mouth daily. Taking it at bedtime) 30 tablet 3   budesonide (PULMICORT) 0.25 MG/2ML nebulizer solution Take 2 mLs (0.25 mg total) by nebulization 2 (two) times daily. 60 mL 12   Cholecalciferol (VITAMIN D3) 125 MCG (5000 UT) CAPS Take 5,000 Units by mouth at bedtime.     diclofenac sodium (VOLTAREN) 1 % GEL Apply 2.25 g topically 3 (three) times daily as needed (for pain).      doxycycline (VIBRAMYCIN) 100 MG capsule Take 100 mg by mouth 2 (two) times daily.     ferrous sulfate 325 (65 FE) MG tablet Take 1 tablet (325 mg total) by mouth daily with breakfast. 90 tablet 0   furosemide (LASIX) 20 MG tablet Take 1 tablet (20 mg total) by mouth daily as needed for fluid or edema. Take Lasix 20 mg once daily with an additional dose as needed for fluid overload. (Patient taking differently: Take 20 mg by mouth daily. Take 35m if needed for fluid overload) 90 tablet 3   HYDROcodone-acetaminophen (NORCO) 7.5-325 MG tablet Take 1 tablet by mouth every 6 (six) hours as needed for moderate pain. 20 tablet 0   latanoprost (XALATAN) 0.005 % ophthalmic solution Place 1 drop into both eyes at bedtime.     levothyroxine (SYNTHROID) 125 MCG tablet Take 125 mcg by mouth daily before breakfast.      losartan (COZAAR) 100 MG tablet Take 1 tablet (100 mg total) by mouth daily. TAKE ONE TABLET BY MOUTH AT NOON Strength: 100 mg 90 tablet 3   Multiple Vitamins-Minerals (ICAPS AREDS 2 PO) Take 1 capsule by mouth 2 (two) times daily.     pantoprazole (PROTONIX) 40 MG tablet Take 1 tablet (40 mg total) by mouth daily. Take Protonix 40 mg daily  after 08/20/2021 when on Eliquis. (Patient taking differently: Take 40 mg by mouth 2 (two) times daily. Take Protonix 40 mg daily after 08/20/2021 when on Eliquis.) 90 tablet 0   rOPINIRole (REQUIP) 1 MG tablet Take 1 mg by mouth See admin instructions. Take 1 mg by mouth once a day between 3 PM-4 PM and an additional 1 mg in the evening, if no relief  12   carvedilol (COREG) 6.25 MG tablet Take 1 tablet (6.25 mg total) by mouth 2 (two) times daily with a meal for 14 days. 28 tablet 0   No facility-administered medications prior to visit.   Final Medications at End of Visit    Current Meds  Medication Sig   albuterol (PROVENTIL) (2.5 MG/3ML) 0.083% nebulizer solution Take 3 mLs (2.5 mg total) by  nebulization every 6 (six) hours as needed for wheezing or shortness of breath.   albuterol (VENTOLIN HFA) 108 (90 Base) MCG/ACT inhaler Inhale 2 puffs into the lungs every 6 (six) hours as needed for wheezing or shortness of breath.   amiodarone (PACERONE) 100 MG tablet Take 2 tablets (200 mg total) by mouth daily.   apixaban (ELIQUIS) 2.5 MG TABS tablet Take 1 tablet (2.5 mg total) by mouth 2 (two) times daily.   Bempedoic Acid-Ezetimibe (NEXLIZET) 180-10 MG TABS Take 1 capsule by mouth daily. (Patient taking differently: Take 1 capsule by mouth daily. Taking it at bedtime)   budesonide (PULMICORT) 0.25 MG/2ML nebulizer solution Take 2 mLs (0.25 mg total) by nebulization 2 (two) times daily.   Cholecalciferol (VITAMIN D3) 125 MCG (5000 UT) CAPS Take 5,000 Units by mouth at bedtime.   diclofenac sodium (VOLTAREN) 1 % GEL Apply 2.25 g topically 3 (three) times daily as needed (for pain).    doxycycline (VIBRAMYCIN) 100 MG capsule Take 100 mg by mouth 2 (two) times daily.   ferrous sulfate 325 (65 FE) MG tablet Take 1 tablet (325 mg total) by mouth daily with breakfast.   furosemide (LASIX) 20 MG tablet Take 1 tablet (20 mg total) by mouth daily as needed for fluid or edema. Take Lasix 20 mg once daily with  an additional dose as needed for fluid overload. (Patient taking differently: Take 20 mg by mouth daily. Take 55m if needed for fluid overload)   HYDROcodone-acetaminophen (NORCO) 7.5-325 MG tablet Take 1 tablet by mouth every 6 (six) hours as needed for moderate pain.   latanoprost (XALATAN) 0.005 % ophthalmic solution Place 1 drop into both eyes at bedtime.   levothyroxine (SYNTHROID) 125 MCG tablet Take 125 mcg by mouth daily before breakfast.    losartan (COZAAR) 100 MG tablet Take 1 tablet (100 mg total) by mouth daily. TAKE ONE TABLET BY MOUTH AT NOON Strength: 100 mg   metolazone (ZAROXOLYN) 2.5 MG tablet Take 1 tablet (2.5 mg total) by mouth as needed (volume overload).   Multiple Vitamins-Minerals (ICAPS AREDS 2 PO) Take 1 capsule by mouth 2 (two) times daily.   pantoprazole (PROTONIX) 40 MG tablet Take 1 tablet (40 mg total) by mouth daily. Take Protonix 40 mg daily after 08/20/2021 when on Eliquis. (Patient taking differently: Take 40 mg by mouth 2 (two) times daily. Take Protonix 40 mg daily after 08/20/2021 when on Eliquis.)   rOPINIRole (REQUIP) 1 MG tablet Take 1 mg by mouth See admin instructions. Take 1 mg by mouth once a day between 3 PM-4 PM and an additional 1 mg in the evening, if no relief    Radiology:   CT Abdomen and Pelvis W Contrast 12/20/2019: 1. Large Infrarenal Abdominal Aortic Aneurysm measuring up to 5.7 cm with mild perianeurysmal inflammation. Differential considerations include impending AAA rupture, mycotic AAA, symptomatic AAA. Recommend urgent transfer to emergency department and Vascular Surgery consultation. Aortic aneurysm NOS (ICD10-I71.9). Aortic Atherosclerosis (ICD10-I70.0). 2. No other acute or inflammatory process identified. 5 cm fat containing left pelvic hernia. Large bowel diverticulosis. 3. Simple appearing 4 cm left ovarian cyst, recommend follow-up Ultrasound in 6-12 months  Chest X-ray 12/20/2019:  Diffuse cardiac enlargement. Emphysematous  changes in the lungs. Coarse interstitial pattern to the lungs could represent fibrosis or edema. No focal consolidation. No pleural effusions. No pneumothorax. Calcified and tortuous aorta. Degenerative changes in the shoulders and spine. Old rib fractures. Similar appearance to previous study. IMPRESSION: 1. Cardiac enlargement. 2. Coarse interstitial pattern  to the lungs could represent fibrosis or edema.  Abdominal X-Ray 12/20/2019:  Interval placement of aorto iliac stent graft. Nonobstructive bowel gas pattern.  Cardiac Studies:   Lexiscan myoview stress test 12/08/2017: 1. Lexiscan stress test was performed. Exercise capacity was not assessed. Stress symptoms included abdominal pain. Blood pressure was normal. The resting and stress electrocardiogram demonstrated normal sinus rhythm, normal resting conduction, no resting arrhythmias and normal rest repolarization. 2. The overall quality of the study is good. There is no evidence of abnormal lung activity. Stress and rest SPECT images demonstrate homogeneous tracer distribution throughout the myocardium. Gated SPECT imaging reveals normal myocardial thickening and wall motion. The left ventricular ejection fraction was normal (54%). 3. Low risk study  Renal artery duplex 09/27/2017: Hemodynamically significant stenosis bilaterally. Renal length is within normal limits for both kidneys. There is an incidental 3.9x3.4x3.4 cm proximal and mid abdominal aortic aneurysm. Recommend dedicated abdominal aortic duplex scan to further define AAA.  Abdominal Aortic Duplex  01/01/2019: Moderate dilatation of the abdominal aorta is noted in the proximal, mid and distal aorta. An abdominal aortic aneurysm measuring 4.5 x 4.52 x 4.52 cm is seen.  Recheck in 6 months for stability of the aneurysm.  TEE 12/24/2019:  1. Left ventricular ejection fraction, by estimation, is 50 to 55%. The left ventricle has low normal function. Left ventricular  diastolic function could not be evaluated.   2. Right ventricular systolic function is normal. The right ventricular size is normal.   3. Left atrial size was moderately dilated. No left atrial/left atrial appendage thrombus was detected. The LAA emptying velocity was 30 cm/s.   4. The mitral valve is normal in structure. Mild mitral valve regurgitation. No evidence of mitral stenosis.   5. The aortic valve is calcified. Aortic valve regurgitation is not visualized. Mild aortic valve stenosis. Aortic valve mean gradient measures 10.8 mmHg.   6. There is Moderate (Grade III) layered plaque involving the ascending, transverse and descending aorta.   Ambulatory cardiac telemetry 14 days 03/22/2021 - 04/04/2021: Predominant underlying rhythm was sinus with first-degree AV block.  Patient with 19 brief episodes of SVT which were asymptomatic.  Frequent PVCs with 20.4% burden and episodes of ventricular bigeminy and trigeminy which correlated with patient triggered events.  No evidence of atrial fibrillation, ventricular tachycardia, pauses >3 seconds, or high degree AV block.  Echocardiogram 04/20/2021:  Normal LV systolic function with visual EF 55-60%. Left ventricle cavity  is normal in size. Moderate left ventricular hypertrophy. Normal global  wall motion. Indeterminate diastolic filling pattern.  Left atrial cavity is severely dilated.  Moderate aortic valve stenosis (peak velocity 3.80ms, MG 23.064mG, AVA  (VTI) measures 1.3 cm^2, DI 0.4).  Mild (Grade I) mitral regurgitation.  Mild tricuspid regurgitation. Mild pulmonary hypertension. RVSP measures  42 mmHg.  IVC is dilated with a respiratory response of >50%.  Compared to study 05/27/2020 LVEF remains preserved, G2DD is now  indeterminate, mild LAE is now severe, mild AS is now moderate, mild PHTN  is new finding (RVSP 425m).   EKG  08/26/2021: Normal sinus rhythm at a rate of 62 bpm.   07/06/21: Sinus rhythm at a rate of 59 bpm.   Normal axis.  Poor R wave progression, cannot exclude anteroseptal infarct old.  No evidence of ischemia or underlying injury pattern.  Compared EKG 06/09/2021, no significant change.  03/22/2021: Sinus rhythm with first-degree AV block with frequent PVCs at a rate of 86 bpm.  02/16/2021: Sinus rhythm with frequent PVCs  in a trigeminal pattern and a rate of 90 bpm.  Normal axis.  Poor R wave progression, cannot exclude anteroseptal infarct old.  05/31/2020: Sinus rhythm with borderline first-degree AV block at rate of 61 bpm, left atrial enlargement, normal axis.  Poor R wave progression, probably normal variant but cannot exclude anteroseptal infarct old.  No evidence of ischemia, normal QT interval. No significant change from 01/02/2020.  Assessment     ICD-10-CM   1. Paroxysmal atrial fibrillation (HCC)  I48.0 EKG 12-Lead    2. Chronic diastolic (congestive) heart failure (HCC)  I43.32 Basic metabolic panel    Brain natriuretic peptide     This patients CHA2DS2-VASc Score 6 (CHF, HTN, vasc, age, F) and yearly risk of stroke 9.8%.  Meds ordered this encounter  Medications   metolazone (ZAROXOLYN) 2.5 MG tablet    Sig: Take 1 tablet (2.5 mg total) by mouth as needed (volume overload).    Dispense:  30 tablet    Refill:  0    There are no discontinued medications.   Recommendations:   Lisa Mcclure  is a 88 y.o.  female  with past medical history of obesity, chronic diastolic CHF, hypertensive heart disease, paroxysmal atrial fibrillation, anemia, hemorrhoids, diverticulosis, esophageal reflux, depression, OSA intolerant to CPAP on nocturnal 02, rheumatoid arthritis, and venous insufficiency. CT scan of the chest 03/2016 without definitive ILD. LM and 2 vessel CAD, mild calcification on mitral and aortic valve. S/p AAA repair on 12/20/2019.  Her last episode of atrial fibrillation was in August 2021.    Patient admitted 06/15/21-06/21/21 with acute respiratory failure. Evaluation  revealed anemia requiring transfusion, likely secondary to gastric ulcer.  Patient presents today for urgent visit with concerns of worsening bilateral leg edema over the last several days.  Weight remains stable and she denies orthopnea.  Patient presently taking 40 mg Lasix p.o. twice daily for the last 3 days.  We will therefore add Zaroxolyn 2.5 mg once daily for the next 2 days, then reduce Lasix to 40 mg p.o. daily.  We will repeat BMP and BNP in 1 week.  Again reiterated the importance of DASH diet, weight loss, and conservative measures to assist in mobilizing lower leg edema.  Follow up in 6 weeks, sooner if needed.    Alethia Berthold, PA-C 08/26/2021, 1:52 PM Office: [phone]

## 2021-08-28 DIAGNOSIS — I130 Hypertensive heart and chronic kidney disease with heart failure and stage 1 through stage 4 chronic kidney disease, or unspecified chronic kidney disease: Secondary | ICD-10-CM | POA: Diagnosis not present

## 2021-08-28 DIAGNOSIS — N1832 Chronic kidney disease, stage 3b: Secondary | ICD-10-CM | POA: Diagnosis not present

## 2021-08-28 DIAGNOSIS — I5032 Chronic diastolic (congestive) heart failure: Secondary | ICD-10-CM | POA: Diagnosis not present

## 2021-08-28 DIAGNOSIS — J9602 Acute respiratory failure with hypercapnia: Secondary | ICD-10-CM | POA: Diagnosis not present

## 2021-08-28 DIAGNOSIS — J9601 Acute respiratory failure with hypoxia: Secondary | ICD-10-CM | POA: Diagnosis not present

## 2021-08-28 DIAGNOSIS — K259 Gastric ulcer, unspecified as acute or chronic, without hemorrhage or perforation: Secondary | ICD-10-CM | POA: Diagnosis not present

## 2021-08-30 ENCOUNTER — Telehealth: Payer: Self-pay

## 2021-08-30 DIAGNOSIS — R7989 Other specified abnormal findings of blood chemistry: Secondary | ICD-10-CM | POA: Diagnosis not present

## 2021-08-30 DIAGNOSIS — E785 Hyperlipidemia, unspecified: Secondary | ICD-10-CM | POA: Diagnosis not present

## 2021-08-30 DIAGNOSIS — I10 Essential (primary) hypertension: Secondary | ICD-10-CM | POA: Diagnosis not present

## 2021-08-30 DIAGNOSIS — G4734 Idiopathic sleep related nonobstructive alveolar hypoventilation: Secondary | ICD-10-CM | POA: Diagnosis not present

## 2021-08-30 DIAGNOSIS — R600 Localized edema: Secondary | ICD-10-CM | POA: Diagnosis not present

## 2021-08-30 DIAGNOSIS — G4733 Obstructive sleep apnea (adult) (pediatric): Secondary | ICD-10-CM | POA: Diagnosis not present

## 2021-08-30 DIAGNOSIS — M5416 Radiculopathy, lumbar region: Secondary | ICD-10-CM | POA: Diagnosis not present

## 2021-08-30 DIAGNOSIS — D649 Anemia, unspecified: Secondary | ICD-10-CM | POA: Diagnosis not present

## 2021-08-30 DIAGNOSIS — I350 Nonrheumatic aortic (valve) stenosis: Secondary | ICD-10-CM | POA: Diagnosis not present

## 2021-08-30 DIAGNOSIS — R0600 Dyspnea, unspecified: Secondary | ICD-10-CM | POA: Diagnosis not present

## 2021-08-30 DIAGNOSIS — L97221 Non-pressure chronic ulcer of left calf limited to breakdown of skin: Secondary | ICD-10-CM | POA: Diagnosis not present

## 2021-08-30 DIAGNOSIS — I5032 Chronic diastolic (congestive) heart failure: Secondary | ICD-10-CM | POA: Diagnosis not present

## 2021-08-30 NOTE — Telephone Encounter (Signed)
Lisa Mcclure with [facility] :  Patient was going to be PCP today and they will be drawing : CBC, CMP, BNP. Per [facility], PA-C, that is all we need from her as well.

## 2021-08-31 ENCOUNTER — Ambulatory Visit: Payer: Medicare Other | Admitting: Student

## 2021-08-31 DIAGNOSIS — N1832 Chronic kidney disease, stage 3b: Secondary | ICD-10-CM | POA: Diagnosis not present

## 2021-08-31 DIAGNOSIS — J9601 Acute respiratory failure with hypoxia: Secondary | ICD-10-CM | POA: Diagnosis not present

## 2021-08-31 DIAGNOSIS — J9602 Acute respiratory failure with hypercapnia: Secondary | ICD-10-CM | POA: Diagnosis not present

## 2021-08-31 DIAGNOSIS — K259 Gastric ulcer, unspecified as acute or chronic, without hemorrhage or perforation: Secondary | ICD-10-CM | POA: Diagnosis not present

## 2021-08-31 DIAGNOSIS — I130 Hypertensive heart and chronic kidney disease with heart failure and stage 1 through stage 4 chronic kidney disease, or unspecified chronic kidney disease: Secondary | ICD-10-CM | POA: Diagnosis not present

## 2021-08-31 DIAGNOSIS — I5032 Chronic diastolic (congestive) heart failure: Secondary | ICD-10-CM | POA: Diagnosis not present

## 2021-09-02 DIAGNOSIS — J9601 Acute respiratory failure with hypoxia: Secondary | ICD-10-CM | POA: Diagnosis not present

## 2021-09-02 DIAGNOSIS — N1832 Chronic kidney disease, stage 3b: Secondary | ICD-10-CM | POA: Diagnosis not present

## 2021-09-02 DIAGNOSIS — I5032 Chronic diastolic (congestive) heart failure: Secondary | ICD-10-CM | POA: Diagnosis not present

## 2021-09-02 DIAGNOSIS — I130 Hypertensive heart and chronic kidney disease with heart failure and stage 1 through stage 4 chronic kidney disease, or unspecified chronic kidney disease: Secondary | ICD-10-CM | POA: Diagnosis not present

## 2021-09-02 DIAGNOSIS — K259 Gastric ulcer, unspecified as acute or chronic, without hemorrhage or perforation: Secondary | ICD-10-CM | POA: Diagnosis not present

## 2021-09-02 DIAGNOSIS — J9602 Acute respiratory failure with hypercapnia: Secondary | ICD-10-CM | POA: Diagnosis not present

## 2021-09-07 DIAGNOSIS — I130 Hypertensive heart and chronic kidney disease with heart failure and stage 1 through stage 4 chronic kidney disease, or unspecified chronic kidney disease: Secondary | ICD-10-CM | POA: Diagnosis not present

## 2021-09-07 DIAGNOSIS — J9601 Acute respiratory failure with hypoxia: Secondary | ICD-10-CM | POA: Diagnosis not present

## 2021-09-07 DIAGNOSIS — N1832 Chronic kidney disease, stage 3b: Secondary | ICD-10-CM | POA: Diagnosis not present

## 2021-09-07 DIAGNOSIS — I5032 Chronic diastolic (congestive) heart failure: Secondary | ICD-10-CM | POA: Diagnosis not present

## 2021-09-07 DIAGNOSIS — J9602 Acute respiratory failure with hypercapnia: Secondary | ICD-10-CM | POA: Diagnosis not present

## 2021-09-07 DIAGNOSIS — K259 Gastric ulcer, unspecified as acute or chronic, without hemorrhage or perforation: Secondary | ICD-10-CM | POA: Diagnosis not present

## 2021-09-08 ENCOUNTER — Encounter: Payer: Self-pay | Admitting: *Deleted

## 2021-09-08 ENCOUNTER — Other Ambulatory Visit: Payer: Self-pay | Admitting: *Deleted

## 2021-09-08 DIAGNOSIS — I5032 Chronic diastolic (congestive) heart failure: Secondary | ICD-10-CM | POA: Diagnosis not present

## 2021-09-08 DIAGNOSIS — N1832 Chronic kidney disease, stage 3b: Secondary | ICD-10-CM | POA: Diagnosis not present

## 2021-09-08 DIAGNOSIS — I130 Hypertensive heart and chronic kidney disease with heart failure and stage 1 through stage 4 chronic kidney disease, or unspecified chronic kidney disease: Secondary | ICD-10-CM | POA: Diagnosis not present

## 2021-09-08 DIAGNOSIS — J9602 Acute respiratory failure with hypercapnia: Secondary | ICD-10-CM | POA: Diagnosis not present

## 2021-09-08 DIAGNOSIS — J9601 Acute respiratory failure with hypoxia: Secondary | ICD-10-CM | POA: Diagnosis not present

## 2021-09-08 DIAGNOSIS — K259 Gastric ulcer, unspecified as acute or chronic, without hemorrhage or perforation: Secondary | ICD-10-CM | POA: Diagnosis not present

## 2021-09-08 NOTE — Patient Outreach (Signed)
Care Management Clinical Social Work Note  09/08/2021 Name: Lisa Mcclure MRN: [id number] DOB: 04-07-1933  Lisa Mcclure is a 88 y.o. year old female who is a primary care patient of Lisa Lopes, MD.  The Care Management team was consulted for assistance with chronic disease management and coordination needs.  Engaged with patient by telephone for follow up visit in response to provider referral for social work chronic care management and care coordination services  Consent to Services:  Lisa Mcclure was given information about Care Management services today including:  Care Management services includes personalized support from designated clinical staff supervised by her physician, including individualized plan of care and coordination with other care providers 24/7 contact phone numbers for assistance for urgent and routine care needs. The patient may stop case management services at any time by phone call to the office staff.  Patient agreed to services and consent obtained.   Assessment: Review of patient past medical history, allergies, medications, and health status, including review of relevant consultants reports was performed today as part of a comprehensive evaluation and provision of chronic care management and care coordination services.  SDOH (Social Determinants of Health) assessments and interventions performed:    Advanced Directives Status: Not addressed in this encounter.  Care Plan  Allergies  Allergen Reactions   Statins Other (See Comments)    Muscle aches and INTERNAL BLEEDING   Atorvastatin     Other reaction(s): Myalgias   Gabapentin Other (See Comments)    "Made me loopy"   Methocarbamol Other (See Comments)    "Made me loopy"    Outpatient Encounter Medications as of 09/08/2021  Medication Sig   albuterol (PROVENTIL) (2.5 MG/3ML) 0.083% nebulizer solution Take 3 mLs (2.5 mg total) by nebulization every 6 (six) hours as needed for wheezing  or shortness of breath.   albuterol (VENTOLIN HFA) 108 (90 Base) MCG/ACT inhaler Inhale 2 puffs into the lungs every 6 (six) hours as needed for wheezing or shortness of breath.   amiodarone (PACERONE) 100 MG tablet Take 2 tablets (200 mg total) by mouth daily.   apixaban (ELIQUIS) 2.5 MG TABS tablet Take 1 tablet (2.5 mg total) by mouth 2 (two) times daily.   Bempedoic Acid-Ezetimibe (NEXLIZET) 180-10 MG TABS Take 1 capsule by mouth daily. (Patient taking differently: Take 1 capsule by mouth daily. Taking it at bedtime)   budesonide (PULMICORT) 0.25 MG/2ML nebulizer solution Take 2 mLs (0.25 mg total) by nebulization 2 (two) times daily.   carvedilol (COREG) 6.25 MG tablet Take 1 tablet (6.25 mg total) by mouth 2 (two) times daily with a meal for 14 days.   Cholecalciferol (VITAMIN D3) 125 MCG (5000 UT) CAPS Take 5,000 Units by mouth at bedtime.   diclofenac sodium (VOLTAREN) 1 % GEL Apply 2.25 g topically 3 (three) times daily as needed (for pain).    doxycycline (VIBRAMYCIN) 100 MG capsule Take 100 mg by mouth 2 (two) times daily.   ferrous sulfate 325 (65 FE) MG tablet Take 1 tablet (325 mg total) by mouth daily with breakfast.   furosemide (LASIX) 20 MG tablet Take 1 tablet (20 mg total) by mouth daily as needed for fluid or edema. Take Lasix 20 mg once daily with an additional dose as needed for fluid overload. (Patient taking differently: Take 20 mg by mouth daily. Take '20mg'$  if needed for fluid overload)   HYDROcodone-acetaminophen (NORCO) 7.5-325 MG tablet Take 1 tablet by mouth every 6 (six) hours as needed for moderate pain.  latanoprost (XALATAN) 0.005 % ophthalmic solution Place 1 drop into both eyes at bedtime.   levothyroxine (SYNTHROID) 125 MCG tablet Take 125 mcg by mouth daily before breakfast.    losartan (COZAAR) 100 MG tablet Take 1 tablet (100 mg total) by mouth daily. TAKE ONE TABLET BY MOUTH AT NOON Strength: 100 mg   metolazone (ZAROXOLYN) 2.5 MG tablet Take 1 tablet (2.5 mg  total) by mouth as needed (volume overload).   Multiple Vitamins-Minerals (ICAPS AREDS 2 PO) Take 1 capsule by mouth 2 (two) times daily.   pantoprazole (PROTONIX) 40 MG tablet Take 1 tablet (40 mg total) by mouth daily. Take Protonix 40 mg daily after 08/20/2021 when on Eliquis. (Patient taking differently: Take 40 mg by mouth 2 (two) times daily. Take Protonix 40 mg daily after 08/20/2021 when on Eliquis.)   rOPINIRole (REQUIP) 1 MG tablet Take 1 mg by mouth See admin instructions. Take 1 mg by mouth once a day between 3 PM-4 PM and an additional 1 mg in the evening, if no relief   No facility-administered encounter medications on file as of 09/08/2021.    Patient Active Problem List   Diagnosis Date Noted   Acute respiratory failure with hypoxia ([place]) 06/15/2021   Paroxysmal atrial fibrillation ([place]) 06/15/2021   Acute renal failure superimposed on stage 3b chronic kidney disease ([place]) 06/15/2021   Generalized weakness 06/15/2021   Atrial fibrillation with RVR ([place])    Long term (current) use of anticoagulants    Long term current use of antiarrhythmic drug    AAA (abdominal aortic aneurysm) without rupture ([place]) 12/20/2019   AAA (abdominal aortic aneurysm) ([place]) 12/20/2019   Intolerance of continuous positive airway pressure (CPAP) ventilation 11/26/2019   Severe hypoxemia 08/21/2019   Severe obstructive sleep apnea 07/29/2019   Chronic obstructive pulmonary disease ([place]) 07/29/2019   RLS (restless legs syndrome) 07/29/2019   Dehydration with hyponatremia 09/13/2018   Bradycardia 09/13/2018   Glaucoma 09/13/2018   OA (osteoarthritis) of shoulder 05/16/2018   Incomplete emptying of bladder 05/17/2017   Labial adhesions 05/17/2017   Mixed stress and urge urinary incontinence 05/17/2017   Urethral sphincter deficiency, intrinsic (ISD) 05/17/2017   Aortic stenosis, mild 01/15/2016   Bilateral carotid bruits 05/11/2015   Hyponatremia 04/19/2015   Symptomatic anemia 04/19/2015   Panic  attack 04/19/2015   Constipation 04/19/2015   Hyperkalemia 04/17/2015   Influenza A 04/16/2015   Proximal humerus fracture    Left patella fracture    Benign essential HTN    Esophageal reflux    Chronic heart failure with preserved ejection fraction (HFpEF) ([place])    Hypothyroidism    Lower GI bleed 04/08/2015   Hematochezia    Rectal bleeding 04/03/2015   Patellar fracture 03/06/2015   Left humeral fracture 11/04/2013   Rib fracture 11/04/2013   Hypothyroid 11/04/2013   Hypertension 11/04/2013   Right shoulder pain 11/04/2013   OSA (obstructive sleep apnea) 11/13/2008   BOOP (bronchiolitis obliterans with organizing pneumonia) ([place]) 08/25/2008    Conditions to be addressed/monitored: Depressive Symptoms and Panic Attacks.  Limited Social Support, Mental Health Concerns, Social Isolation, and Lacks Knowledge of [employer].  Care Plan : LCSW Plan of Care  Updates made by Francis Gaines, LCSW since 09/08/2021 12:00 AM     Problem: Reduce and Manage My Symptoms of Depression.   Priority: High     Goal: Reduce and Manage My Symptoms of Depression.   Start Date: 08/11/2021  Expected End Date: 11/11/2021  This Visit's Progress:  On track  Recent Progress: On track  Priority: High  Note:   Current Barriers:   Acute Mental Health needs related to History of Panic Attacks and Depressive Symptoms, requires Support, Education, Resources, Referrals, Advocacy, and Care Coordination, in order to meet unmet Acute Mental Health needs. Clinical Goal(s):  Patient will work with LCSW, to reduce and manage symptoms of Depression, until established with a community mental health provider.     Patient will increase knowledge and/or ability of:        Coping Skills, Healthy Habits, Self-Management Skills, Stress Reduction, Home Safety, and Utilizing [employer] and Resources.   Interventions: Collaboration with Primary Care Physician, Dr. [name] regarding development  and update of comprehensive plan of care, as evidenced by provider attestation and co-signature. Inter-disciplinary care team collaboration (see longitudinal plan of care). Clinical Interventions:  Mindfulness Meditation Strategies, Relaxation Techniques and Deep Breathing Exercises taught, and encouraged daily. Solution-Focused Therapy performed. Verbalization of Feelings encouraged. Emotional Support provided. Problem Solving Solutions developed. Cognitive Behavioral Therapy initiated. Increase Level of Activity/Exercise emphasized.    Patient Goals/Self-Care Activities: Continue to receive personal counseling with LCSW, on a bi-weekly basis, to reduce and manage symptoms of Depression, until well-managed.   Continue to incorporate into daily practice - relaxation techniques, deep breathing exercises, and mindfulness meditation strategies. Collaboration with Joellen Jersey, Physician Assistant for [place], Dr. [name], both with [facility], to discuss prescribing a low-dose antidepressant medication.  ~ Next follow-up appointment with Joellen Jersey is scheduled on 09/14/2021 at 1:30 pm. Review EMMI educational material on "Depression in Adults" and "Tips for How to Help Your Mood", and be prepared to discuss during our next scheduled telephone outreach call. Continue to receive home health physical therapy, twice per week, through Tomasita Crumble, [place] Therapist with [facility]. Contact LCSW directly (# [phone]), if you have questions, need assistance, or if additional social work needs are identified.   Follow-Up:  09/15/2021 at 9:15 am   Nat Christen, BSW, MSW, [place]  Licensed Clinical Social Worker  [place]  Mailing [place] [address], [place], [place] [address] Physical Address-[address] [place], [place] [address] Toll Free Main # [phone] Fax # [phone] Cell #  [phone] [email or web address]

## 2021-09-09 DIAGNOSIS — J9601 Acute respiratory failure with hypoxia: Secondary | ICD-10-CM | POA: Diagnosis not present

## 2021-09-09 DIAGNOSIS — I5032 Chronic diastolic (congestive) heart failure: Secondary | ICD-10-CM | POA: Diagnosis not present

## 2021-09-09 DIAGNOSIS — J9602 Acute respiratory failure with hypercapnia: Secondary | ICD-10-CM | POA: Diagnosis not present

## 2021-09-09 DIAGNOSIS — K259 Gastric ulcer, unspecified as acute or chronic, without hemorrhage or perforation: Secondary | ICD-10-CM | POA: Diagnosis not present

## 2021-09-09 DIAGNOSIS — I130 Hypertensive heart and chronic kidney disease with heart failure and stage 1 through stage 4 chronic kidney disease, or unspecified chronic kidney disease: Secondary | ICD-10-CM | POA: Diagnosis not present

## 2021-09-09 DIAGNOSIS — N1832 Chronic kidney disease, stage 3b: Secondary | ICD-10-CM | POA: Diagnosis not present

## 2021-09-13 DIAGNOSIS — H401122 Primary open-angle glaucoma, left eye, moderate stage: Secondary | ICD-10-CM | POA: Diagnosis not present

## 2021-09-13 DIAGNOSIS — H401112 Primary open-angle glaucoma, right eye, moderate stage: Secondary | ICD-10-CM | POA: Diagnosis not present

## 2021-09-14 DIAGNOSIS — I350 Nonrheumatic aortic (valve) stenosis: Secondary | ICD-10-CM | POA: Diagnosis not present

## 2021-09-14 DIAGNOSIS — D649 Anemia, unspecified: Secondary | ICD-10-CM | POA: Diagnosis not present

## 2021-09-14 DIAGNOSIS — G4734 Idiopathic sleep related nonobstructive alveolar hypoventilation: Secondary | ICD-10-CM | POA: Diagnosis not present

## 2021-09-14 DIAGNOSIS — F3289 Other specified depressive episodes: Secondary | ICD-10-CM | POA: Diagnosis not present

## 2021-09-14 DIAGNOSIS — R600 Localized edema: Secondary | ICD-10-CM | POA: Diagnosis not present

## 2021-09-14 DIAGNOSIS — G4733 Obstructive sleep apnea (adult) (pediatric): Secondary | ICD-10-CM | POA: Diagnosis not present

## 2021-09-14 DIAGNOSIS — I5032 Chronic diastolic (congestive) heart failure: Secondary | ICD-10-CM | POA: Diagnosis not present

## 2021-09-14 DIAGNOSIS — N1832 Chronic kidney disease, stage 3b: Secondary | ICD-10-CM | POA: Diagnosis not present

## 2021-09-14 DIAGNOSIS — I130 Hypertensive heart and chronic kidney disease with heart failure and stage 1 through stage 4 chronic kidney disease, or unspecified chronic kidney disease: Secondary | ICD-10-CM | POA: Diagnosis not present

## 2021-09-15 ENCOUNTER — Other Ambulatory Visit: Payer: Self-pay | Admitting: *Deleted

## 2021-09-15 DIAGNOSIS — N1832 Chronic kidney disease, stage 3b: Secondary | ICD-10-CM | POA: Diagnosis not present

## 2021-09-15 DIAGNOSIS — I5032 Chronic diastolic (congestive) heart failure: Secondary | ICD-10-CM | POA: Diagnosis not present

## 2021-09-15 DIAGNOSIS — K259 Gastric ulcer, unspecified as acute or chronic, without hemorrhage or perforation: Secondary | ICD-10-CM | POA: Diagnosis not present

## 2021-09-15 DIAGNOSIS — J9601 Acute respiratory failure with hypoxia: Secondary | ICD-10-CM | POA: Diagnosis not present

## 2021-09-15 DIAGNOSIS — I130 Hypertensive heart and chronic kidney disease with heart failure and stage 1 through stage 4 chronic kidney disease, or unspecified chronic kidney disease: Secondary | ICD-10-CM | POA: Diagnosis not present

## 2021-09-15 DIAGNOSIS — J9602 Acute respiratory failure with hypercapnia: Secondary | ICD-10-CM | POA: Diagnosis not present

## 2021-09-16 ENCOUNTER — Ambulatory Visit: Payer: Self-pay | Admitting: *Deleted

## 2021-09-16 NOTE — Patient Outreach (Signed)
Care Management Clinical Social Work Note  09/16/2021 Name: Lisa Mcclure MRN: [id number] DOB: 04-Jan-1933  Lisa Mcclure is a 88 y.o. year old female who is a primary care patient of Lisa Lopes, MD.  The Care Management team was consulted for assistance with chronic disease management and coordination needs.  Engaged with patient by telephone for follow up visit in response to provider referral for social work chronic care management and care coordination services  Consent to Services:  Lisa Mcclure was given information about Care Management services today including:  Care Management services includes personalized support from designated clinical staff supervised by her physician, including individualized plan of care and coordination with other care providers 24/7 contact phone numbers for assistance for urgent and routine care needs. The patient may stop case management services at any time by phone call to the office staff.  Patient agreed to services and consent obtained.   Assessment: Review of patient past medical history, allergies, medications, and health status, including review of relevant consultants reports was performed today as part of a comprehensive evaluation and provision of chronic care management and care coordination services.  SDOH (Social Determinants of Health) assessments and interventions performed:    Advanced Directives Status: Not addressed in this encounter.  Care Plan  Allergies  Allergen Reactions   Statins Other (See Comments)    Muscle aches and INTERNAL BLEEDING   Atorvastatin     Other reaction(s): Myalgias   Gabapentin Other (See Comments)    "Made me loopy"   Methocarbamol Other (See Comments)    "Made me loopy"    Outpatient Encounter Medications as of 09/15/2021  Medication Sig   albuterol (PROVENTIL) (2.5 MG/3ML) 0.083% nebulizer solution Take 3 mLs (2.5 mg total) by nebulization every 6 (six) hours as needed for wheezing  or shortness of breath.   albuterol (VENTOLIN HFA) 108 (90 Base) MCG/ACT inhaler Inhale 2 puffs into the lungs every 6 (six) hours as needed for wheezing or shortness of breath.   amiodarone (PACERONE) 100 MG tablet Take 2 tablets (200 mg total) by mouth daily.   apixaban (ELIQUIS) 2.5 MG TABS tablet Take 1 tablet (2.5 mg total) by mouth 2 (two) times daily.   Bempedoic Acid-Ezetimibe (NEXLIZET) 180-10 MG TABS Take 1 capsule by mouth daily. (Patient taking differently: Take 1 capsule by mouth daily. Taking it at bedtime)   budesonide (PULMICORT) 0.25 MG/2ML nebulizer solution Take 2 mLs (0.25 mg total) by nebulization 2 (two) times daily.   carvedilol (COREG) 6.25 MG tablet Take 1 tablet (6.25 mg total) by mouth 2 (two) times daily with a meal for 14 days.   Cholecalciferol (VITAMIN D3) 125 MCG (5000 UT) CAPS Take 5,000 Units by mouth at bedtime.   diclofenac sodium (VOLTAREN) 1 % GEL Apply 2.25 g topically 3 (three) times daily as needed (for pain).    doxycycline (VIBRAMYCIN) 100 MG capsule Take 100 mg by mouth 2 (two) times daily.   ferrous sulfate 325 (65 FE) MG tablet Take 1 tablet (325 mg total) by mouth daily with breakfast.   furosemide (LASIX) 20 MG tablet Take 1 tablet (20 mg total) by mouth daily as needed for fluid or edema. Take Lasix 20 mg once daily with an additional dose as needed for fluid overload. (Patient taking differently: Take 20 mg by mouth daily. Take '20mg'$  if needed for fluid overload)   HYDROcodone-acetaminophen (NORCO) 7.5-325 MG tablet Take 1 tablet by mouth every 6 (six) hours as needed for moderate pain.  latanoprost (XALATAN) 0.005 % ophthalmic solution Place 1 drop into both eyes at bedtime.   levothyroxine (SYNTHROID) 125 MCG tablet Take 125 mcg by mouth daily before breakfast.    losartan (COZAAR) 100 MG tablet Take 1 tablet (100 mg total) by mouth daily. TAKE ONE TABLET BY MOUTH AT NOON Strength: 100 mg   metolazone (ZAROXOLYN) 2.5 MG tablet Take 1 tablet (2.5 mg  total) by mouth as needed (volume overload).   Multiple Vitamins-Minerals (ICAPS AREDS 2 PO) Take 1 capsule by mouth 2 (two) times daily.   pantoprazole (PROTONIX) 40 MG tablet Take 1 tablet (40 mg total) by mouth daily. Take Protonix 40 mg daily after 08/20/2021 when on Eliquis. (Patient taking differently: Take 40 mg by mouth 2 (two) times daily. Take Protonix 40 mg daily after 08/20/2021 when on Eliquis.)   rOPINIRole (REQUIP) 1 MG tablet Take 1 mg by mouth See admin instructions. Take 1 mg by mouth once a day between 3 PM-4 PM and an additional 1 mg in the evening, if no relief   No facility-administered encounter medications on file as of 09/15/2021.    Patient Active Problem List   Diagnosis Date Noted   Acute respiratory failure with hypoxia ([place]) 06/15/2021   Paroxysmal atrial fibrillation ([place]) 06/15/2021   Acute renal failure superimposed on stage 3b chronic kidney disease ([place]) 06/15/2021   Generalized weakness 06/15/2021   Atrial fibrillation with RVR ([place])    Long term (current) use of anticoagulants    Long term current use of antiarrhythmic drug    AAA (abdominal aortic aneurysm) without rupture ([place]) 12/20/2019   AAA (abdominal aortic aneurysm) ([place]) 12/20/2019   Intolerance of continuous positive airway pressure (CPAP) ventilation 11/26/2019   Severe hypoxemia 08/21/2019   Severe obstructive sleep apnea 07/29/2019   Chronic obstructive pulmonary disease ([place]) 07/29/2019   RLS (restless legs syndrome) 07/29/2019   Dehydration with hyponatremia 09/13/2018   Bradycardia 09/13/2018   Glaucoma 09/13/2018   OA (osteoarthritis) of shoulder 05/16/2018   Incomplete emptying of bladder 05/17/2017   Labial adhesions 05/17/2017   Mixed stress and urge urinary incontinence 05/17/2017   Urethral sphincter deficiency, intrinsic (ISD) 05/17/2017   Aortic stenosis, mild 01/15/2016   Bilateral carotid bruits 05/11/2015   Hyponatremia 04/19/2015   Symptomatic anemia 04/19/2015   Panic  attack 04/19/2015   Constipation 04/19/2015   Hyperkalemia 04/17/2015   Influenza A 04/16/2015   Proximal humerus fracture    Left patella fracture    Benign essential HTN    Esophageal reflux    Chronic heart failure with preserved ejection fraction (HFpEF) ([place])    Hypothyroidism    Lower GI bleed 04/08/2015   Hematochezia    Rectal bleeding 04/03/2015   Patellar fracture 03/06/2015   Left humeral fracture 11/04/2013   Rib fracture 11/04/2013   Hypothyroid 11/04/2013   Hypertension 11/04/2013   Right shoulder pain 11/04/2013   OSA (obstructive sleep apnea) 11/13/2008   BOOP (bronchiolitis obliterans with organizing pneumonia) ([place]) 08/25/2008    Conditions to be addressed/monitored:  Depression, Chronic Heart Failure, and Essential Hypertension.  Limited Social Support, Mental Health Concerns, Social Isolation, Limited Access to Caregiver, and Lacks Knowledge of [employer].  Care Plan : LCSW Plan of Care  Updates made by Francis Gaines, LCSW since 09/16/2021 12:00 AM     Problem: Reduce and Manage My Symptoms of Depression.   Priority: High     Goal: Reduce and Manage My Symptoms of Depression.   Start Date: 08/11/2021  Expected  End Date: 11/11/2021  This Visit's Progress: On track  Recent Progress: On track  Priority: High  Note:   Current Barriers:   Acute Mental Health needs related to History of Panic Attacks and Depressive Symptoms, requires Support, Education, Resources, Referrals, Advocacy, and Care Coordination, in order to meet unmet Acute Mental Health needs. Clinical Goal(s):  Patient will work with LCSW, to reduce and manage symptoms of Depression, until established with a community mental health provider.     Patient will increase knowledge and/or ability of:        Coping Skills, Healthy Habits, Self-Management Skills, Stress Reduction, Home Safety, and Utilizing [employer] and Resources.   Interventions: Collaboration with Primary Care  Physician, Dr. [name] regarding development and update of comprehensive plan of care, as evidenced by provider attestation and co-signature. Inter-disciplinary care team collaboration (see longitudinal plan of care). Clinical Interventions:  Mindfulness Meditation Strategies, Relaxation Techniques and Deep Breathing Exercises reviewed and encouraged daily. Solution-Focused Therapy performed. Verbalization of Feelings encouraged. Emotional Support provided. Problem Solving Solutions developed. Cognitive Behavioral Therapy initiated. Increase Level of Activity/Exercise emphasized.    Patient Goals/Self-Care Activities: Continue to receive personal counseling with LCSW, on a bi-weekly basis, to reduce and manage symptoms of Depression, until well-managed.   Thorough review of EMMI educational material on "Depression in Adults" and "Tips for How to Help Your Mood", to ensure understanding and entertain questions.   Continue to receive home health physical therapy, twice per week, through Tomasita Crumble, [place] Therapist with [facility]. Contact LCSW directly (# [phone]), if you have questions, need assistance, or if additional social work needs are identified.   Follow-Up:  09/29/2021 at 10:00 am   Nat Christen, BSW, MSW, [place]  Licensed Clinical Social Worker  [place]  Mailing [place] [address] [place], [place] [address] Physical Address-[address], [place], [place] [address] Toll Free Main # [phone] Fax # [phone] Cell # [phone] [email or web address]

## 2021-09-17 ENCOUNTER — Telehealth: Payer: Self-pay | Admitting: Physical Medicine and Rehabilitation

## 2021-09-17 NOTE — Telephone Encounter (Signed)
Pt called and is still having bil shoulder pains. She said the left side is hurting worse. Can we do a bil shoulder injection?

## 2021-09-21 ENCOUNTER — Telehealth: Payer: Self-pay

## 2021-09-21 DIAGNOSIS — M1990 Unspecified osteoarthritis, unspecified site: Secondary | ICD-10-CM | POA: Diagnosis not present

## 2021-09-21 DIAGNOSIS — M069 Rheumatoid arthritis, unspecified: Secondary | ICD-10-CM | POA: Diagnosis not present

## 2021-09-21 DIAGNOSIS — D631 Anemia in chronic kidney disease: Secondary | ICD-10-CM | POA: Diagnosis not present

## 2021-09-21 DIAGNOSIS — D509 Iron deficiency anemia, unspecified: Secondary | ICD-10-CM | POA: Diagnosis not present

## 2021-09-21 DIAGNOSIS — J45909 Unspecified asthma, uncomplicated: Secondary | ICD-10-CM | POA: Diagnosis not present

## 2021-09-21 DIAGNOSIS — I130 Hypertensive heart and chronic kidney disease with heart failure and stage 1 through stage 4 chronic kidney disease, or unspecified chronic kidney disease: Secondary | ICD-10-CM | POA: Diagnosis not present

## 2021-09-21 DIAGNOSIS — N3281 Overactive bladder: Secondary | ICD-10-CM | POA: Diagnosis not present

## 2021-09-21 DIAGNOSIS — E039 Hypothyroidism, unspecified: Secondary | ICD-10-CM | POA: Diagnosis not present

## 2021-09-21 DIAGNOSIS — I5032 Chronic diastolic (congestive) heart failure: Secondary | ICD-10-CM | POA: Diagnosis not present

## 2021-09-21 DIAGNOSIS — I480 Paroxysmal atrial fibrillation: Secondary | ICD-10-CM | POA: Diagnosis not present

## 2021-09-21 DIAGNOSIS — K259 Gastric ulcer, unspecified as acute or chronic, without hemorrhage or perforation: Secondary | ICD-10-CM | POA: Diagnosis not present

## 2021-09-21 DIAGNOSIS — J9602 Acute respiratory failure with hypercapnia: Secondary | ICD-10-CM | POA: Diagnosis not present

## 2021-09-21 DIAGNOSIS — K5790 Diverticulosis of intestine, part unspecified, without perforation or abscess without bleeding: Secondary | ICD-10-CM | POA: Diagnosis not present

## 2021-09-21 DIAGNOSIS — G4700 Insomnia, unspecified: Secondary | ICD-10-CM | POA: Diagnosis not present

## 2021-09-21 DIAGNOSIS — R1313 Dysphagia, pharyngeal phase: Secondary | ICD-10-CM | POA: Diagnosis not present

## 2021-09-21 DIAGNOSIS — M25511 Pain in right shoulder: Secondary | ICD-10-CM | POA: Diagnosis not present

## 2021-09-21 DIAGNOSIS — J9601 Acute respiratory failure with hypoxia: Secondary | ICD-10-CM | POA: Diagnosis not present

## 2021-09-21 DIAGNOSIS — N1832 Chronic kidney disease, stage 3b: Secondary | ICD-10-CM | POA: Diagnosis not present

## 2021-09-21 DIAGNOSIS — I872 Venous insufficiency (chronic) (peripheral): Secondary | ICD-10-CM | POA: Diagnosis not present

## 2021-09-21 DIAGNOSIS — M25512 Pain in left shoulder: Secondary | ICD-10-CM | POA: Diagnosis not present

## 2021-09-21 DIAGNOSIS — G4733 Obstructive sleep apnea (adult) (pediatric): Secondary | ICD-10-CM | POA: Diagnosis not present

## 2021-09-21 DIAGNOSIS — I714 Abdominal aortic aneurysm, without rupture, unspecified: Secondary | ICD-10-CM | POA: Diagnosis not present

## 2021-09-21 DIAGNOSIS — K219 Gastro-esophageal reflux disease without esophagitis: Secondary | ICD-10-CM | POA: Diagnosis not present

## 2021-09-21 DIAGNOSIS — G2581 Restless legs syndrome: Secondary | ICD-10-CM | POA: Diagnosis not present

## 2021-09-21 DIAGNOSIS — F32A Depression, unspecified: Secondary | ICD-10-CM | POA: Diagnosis not present

## 2021-09-21 NOTE — Telephone Encounter (Signed)
Called and spoke to patient and left a vm to [place] from [facility]

## 2021-09-21 NOTE — Telephone Encounter (Signed)
Patient can hold metolazone and furosemide today. Restart just furosemide tomorrow. Please clarify how much furosemide patient has been taking and update med list accordingly. Can leave metolazone on list PRN

## 2021-09-23 DIAGNOSIS — H353221 Exudative age-related macular degeneration, left eye, with active choroidal neovascularization: Secondary | ICD-10-CM | POA: Diagnosis not present

## 2021-09-23 DIAGNOSIS — H353114 Nonexudative age-related macular degeneration, right eye, advanced atrophic with subfoveal involvement: Secondary | ICD-10-CM | POA: Diagnosis not present

## 2021-09-23 DIAGNOSIS — H43393 Other vitreous opacities, bilateral: Secondary | ICD-10-CM | POA: Diagnosis not present

## 2021-09-23 DIAGNOSIS — H43813 Vitreous degeneration, bilateral: Secondary | ICD-10-CM | POA: Diagnosis not present

## 2021-09-24 ENCOUNTER — Telehealth: Payer: Self-pay | Admitting: Physical Medicine and Rehabilitation

## 2021-09-24 DIAGNOSIS — J9602 Acute respiratory failure with hypercapnia: Secondary | ICD-10-CM | POA: Diagnosis not present

## 2021-09-24 DIAGNOSIS — N1832 Chronic kidney disease, stage 3b: Secondary | ICD-10-CM | POA: Diagnosis not present

## 2021-09-24 DIAGNOSIS — J9601 Acute respiratory failure with hypoxia: Secondary | ICD-10-CM | POA: Diagnosis not present

## 2021-09-24 DIAGNOSIS — I5032 Chronic diastolic (congestive) heart failure: Secondary | ICD-10-CM | POA: Diagnosis not present

## 2021-09-24 DIAGNOSIS — I130 Hypertensive heart and chronic kidney disease with heart failure and stage 1 through stage 4 chronic kidney disease, or unspecified chronic kidney disease: Secondary | ICD-10-CM | POA: Diagnosis not present

## 2021-09-24 DIAGNOSIS — K259 Gastric ulcer, unspecified as acute or chronic, without hemorrhage or perforation: Secondary | ICD-10-CM | POA: Diagnosis not present

## 2021-09-24 NOTE — Telephone Encounter (Signed)
Pt called requesting to reschedule an appt. Please call pt at [phone].

## 2021-09-25 DIAGNOSIS — I5032 Chronic diastolic (congestive) heart failure: Secondary | ICD-10-CM | POA: Diagnosis not present

## 2021-09-25 DIAGNOSIS — K259 Gastric ulcer, unspecified as acute or chronic, without hemorrhage or perforation: Secondary | ICD-10-CM | POA: Diagnosis not present

## 2021-09-25 DIAGNOSIS — N1832 Chronic kidney disease, stage 3b: Secondary | ICD-10-CM | POA: Diagnosis not present

## 2021-09-25 DIAGNOSIS — J9601 Acute respiratory failure with hypoxia: Secondary | ICD-10-CM | POA: Diagnosis not present

## 2021-09-25 DIAGNOSIS — J9602 Acute respiratory failure with hypercapnia: Secondary | ICD-10-CM | POA: Diagnosis not present

## 2021-09-25 DIAGNOSIS — I130 Hypertensive heart and chronic kidney disease with heart failure and stage 1 through stage 4 chronic kidney disease, or unspecified chronic kidney disease: Secondary | ICD-10-CM | POA: Diagnosis not present

## 2021-09-28 DIAGNOSIS — J9601 Acute respiratory failure with hypoxia: Secondary | ICD-10-CM | POA: Diagnosis not present

## 2021-09-28 DIAGNOSIS — K259 Gastric ulcer, unspecified as acute or chronic, without hemorrhage or perforation: Secondary | ICD-10-CM | POA: Diagnosis not present

## 2021-09-28 DIAGNOSIS — I130 Hypertensive heart and chronic kidney disease with heart failure and stage 1 through stage 4 chronic kidney disease, or unspecified chronic kidney disease: Secondary | ICD-10-CM | POA: Diagnosis not present

## 2021-09-28 DIAGNOSIS — J9602 Acute respiratory failure with hypercapnia: Secondary | ICD-10-CM | POA: Diagnosis not present

## 2021-09-28 DIAGNOSIS — N1832 Chronic kidney disease, stage 3b: Secondary | ICD-10-CM | POA: Diagnosis not present

## 2021-09-28 DIAGNOSIS — I5032 Chronic diastolic (congestive) heart failure: Secondary | ICD-10-CM | POA: Diagnosis not present

## 2021-09-29 ENCOUNTER — Other Ambulatory Visit: Payer: Self-pay | Admitting: *Deleted

## 2021-09-29 ENCOUNTER — Encounter: Payer: Medicare Other | Admitting: Physical Medicine and Rehabilitation

## 2021-09-29 ENCOUNTER — Encounter: Payer: Self-pay | Admitting: *Deleted

## 2021-09-30 DIAGNOSIS — I5032 Chronic diastolic (congestive) heart failure: Secondary | ICD-10-CM | POA: Diagnosis not present

## 2021-09-30 DIAGNOSIS — J9602 Acute respiratory failure with hypercapnia: Secondary | ICD-10-CM | POA: Diagnosis not present

## 2021-09-30 DIAGNOSIS — J9601 Acute respiratory failure with hypoxia: Secondary | ICD-10-CM | POA: Diagnosis not present

## 2021-09-30 DIAGNOSIS — I130 Hypertensive heart and chronic kidney disease with heart failure and stage 1 through stage 4 chronic kidney disease, or unspecified chronic kidney disease: Secondary | ICD-10-CM | POA: Diagnosis not present

## 2021-09-30 DIAGNOSIS — N1832 Chronic kidney disease, stage 3b: Secondary | ICD-10-CM | POA: Diagnosis not present

## 2021-09-30 DIAGNOSIS — K259 Gastric ulcer, unspecified as acute or chronic, without hemorrhage or perforation: Secondary | ICD-10-CM | POA: Diagnosis not present

## 2021-10-05 DIAGNOSIS — I130 Hypertensive heart and chronic kidney disease with heart failure and stage 1 through stage 4 chronic kidney disease, or unspecified chronic kidney disease: Secondary | ICD-10-CM | POA: Diagnosis not present

## 2021-10-05 DIAGNOSIS — J9602 Acute respiratory failure with hypercapnia: Secondary | ICD-10-CM | POA: Diagnosis not present

## 2021-10-05 DIAGNOSIS — I5032 Chronic diastolic (congestive) heart failure: Secondary | ICD-10-CM | POA: Diagnosis not present

## 2021-10-05 DIAGNOSIS — K259 Gastric ulcer, unspecified as acute or chronic, without hemorrhage or perforation: Secondary | ICD-10-CM | POA: Diagnosis not present

## 2021-10-05 DIAGNOSIS — J9601 Acute respiratory failure with hypoxia: Secondary | ICD-10-CM | POA: Diagnosis not present

## 2021-10-05 DIAGNOSIS — N1832 Chronic kidney disease, stage 3b: Secondary | ICD-10-CM | POA: Diagnosis not present

## 2021-10-05 NOTE — Progress Notes (Addendum)
Primary Physician/Referring:  [name], MD  [name], female    DOB: 03/29/1933, 88 y.o.   MRN: [id number]  Chief Complaint  Patient presents with   Atrial Fibrillation   Follow-up    6 weeks   HPI:    Lisa Mcclure  is a 88 y.o.  female  with past medical history of obesity, chronic diastolic CHF, hypertensive heart disease, paroxysmal atrial fibrillation, anemia, hemorrhoids, diverticulosis, esophageal reflux, depression, OSA intolerant to CPAP on nocturnal 02, rheumatoid arthritis, and venous insufficiency. CT scan of the chest 03/2016 without definitive ILD. LM and 2 vessel CAD, mild calcification on mitral and aortic valve. S/p AAA repair on 12/20/2019.  Her last episode of atrial fibrillation was in August 2021.    Patient admitted 06/15/21-06/21/21 with acute respiratory failure. Evaluation revealed anemia requiring transfusion, likely secondary to gastric ulcer. Patient was last seen in the office 08/26/2021 with worsening leg edema and shortness of breath. At that time added PRN metolazone. She now presents for follow up. Dyspnea and lew swelling have improved, however she does continue to have intermittent cough and congestion. She is taking Lasix 20 mg daily with metolazone as needed.   Notably patient is seeing PCP next month and will have repeat labs done.   Past Medical History:  Diagnosis Date   Anemia    h/o hemorrhoidal bleeding and blood transfusion   Chronic diastolic CHF (congestive heart failure) (HCC)    Depression    Diverticulosis    DOE (dyspnea on exertion)    Esophageal reflux    GERD (gastroesophageal reflux disease)    Hypothyroidism    Insomnia    LBP (low back pain)    OAB (overactive bladder)    Obesity    OSA (obstructive sleep apnea)    Osteoarthritis    Paroxysmal atrial fibrillation (HCC)    Rheumatoid arthritis(714.0)    Shoulder pain, bilateral    Unspecified essential hypertension    Venous insufficiency     Past Surgical History:  Procedure Laterality Date   ABDOMINAL AORTIC ENDOVASCULAR STENT GRAFT N/A 12/20/2019   Procedure: Aortogram including catheter selection of aorta and bilateral iliac arteriogram, Endovascular repair of infrarenal abdominal aortic aneurysm with bifurcated stent graft (26 mm x 14 x 12 main body, right bell bottom with a 20 mm x 10 cm piece, and left bell bottom with a 16 mm x 12 cm piece) ;  Surgeon: Marty Heck, MD;  Location: [facility] OR;  Service: Vascular;  Laterality: N/A;   [place]   BIOPSY  06/18/2021   Procedure: BIOPSY;  Surgeon: Ronnette Juniper, MD;  Location: [facility] ENDOSCOPY;  Service: Gastroenterology;;   bladder abduction-1996  1996   breast biopsy Right 1980   CARDIOVERSION N/A 12/24/2019   Procedure: CARDIOVERSION;  Surgeon: Adrian Prows, MD;  Location: [facility] ENDOSCOPY;  Service: Cardiovascular;  Laterality: N/A;   [place]     ESOPHAGOGASTRODUODENOSCOPY (EGD) WITH PROPOFOL N/A 06/18/2021   Procedure: ESOPHAGOGASTRODUODENOSCOPY (EGD) WITH PROPOFOL;  Surgeon: Ronnette Juniper, MD;  Location: [facility] ENDOSCOPY;  Service: Gastroenterology;  Laterality: N/A;   FLEXIBLE SIGMOIDOSCOPY N/A 04/10/2015   Procedure: FLEXIBLE SIGMOIDOSCOPY;  Surgeon: Arta Silence, MD;  Location: [facility] ENDOSCOPY;  Service: Endoscopy;  Laterality: N/A;   knee arthroscopy Right 1996, 2010   KNEE ARTHROSCOPY W/ AUTOGENOUS CARTILAGE IMPLANTATION (ACI) PROCEDURE Left 1994, 1995   REFRACTIVE SURGERY  01/2020   SHOULDER SURGERY  1990   TEE WITHOUT CARDIOVERSION N/A 12/24/2019   Procedure: TRANSESOPHAGEAL ECHOCARDIOGRAM (TEE);  Surgeon: Adrian Prows, MD;  Location: [facility] ENDOSCOPY;  Service: Cardiovascular;  Laterality: N/A;   [place] Bilateral 12/20/2019   Procedure: Ultrasound-guided access of bilateral common femoral arteries for delivery of endograft and  percutaneous closure;  Surgeon: Marty Heck, MD;  Location: [facility] OR;  Service: Vascular;  Laterality: Bilateral;   VESICOVAGINAL FISTULA CLOSURE W/ TAH     WRIST SURGERY  1967   Family History  Problem Relation Age of Onset   COPD Father    Lung cancer Mother    Heart attack Maternal Grandmother 51   Breast cancer Maternal Aunt    Lung cancer Son     Social History   Tobacco Use   Smoking status: Former    Packs/day: 0.20    Years: 10.00    Total pack years: 2.00    Types: Cigarettes    Quit date: 04/11/1984    Years since quitting: 37.5    Passive exposure: Past   Smokeless tobacco: Never  Substance Use Topics   Alcohol use: Yes    Alcohol/week: 0.0 standard drinks of alcohol    Comment: cocktail once a week   Marital Status: Widowed  ROS  Review of Systems  Cardiovascular:  Positive for dyspnea on exertion (mild, imporved) and leg swelling (improved). Negative for chest pain, orthopnea, palpitations, paroxysmal nocturnal dyspnea and syncope.  Respiratory:  Negative for cough, hemoptysis and wheezing.   Genitourinary:  Positive for bladder incontinence and urgency.    Objective  Blood pressure (!) 148/64, pulse 65, temperature (!) 97.3 F (36.3 C), temperature source Temporal, resp. rate 16, height 5' 3.5" (1.613 m), weight 210 lb 3.2 oz (95.3 kg), SpO2 95 %.     10/07/2021   12:53 PM 08/26/2021   12:53 PM 07/27/2021    1:35 PM  Vitals with BMI  Height 5' 3.5" 5' 3.5" 5' 3.5"  Weight 210 lbs 3 oz 211 lbs 210 lbs  BMI 36.65 10.17 51.02  Systolic 585 277 824  Diastolic 64 47 76  Pulse 65 62 65     Physical Exam Vitals reviewed.  Constitutional:      General: She is not in acute distress.    Appearance: She is well-developed.  Cardiovascular:     Rate and Rhythm: Normal rate and regular rhythm. No extrasystoles are present.    Pulses: Intact distal pulses.          Carotid pulses are 2+ on the right side and 2+ on the left side.      Radial pulses are 2+  on the right side and 2+ on the left side.     Heart sounds: S1 normal and S2 normal. Murmur heard.     Harsh midsystolic murmur is present with a grade of 3/6 at the upper right sternal border radiating to the apex.     No gallop.     Comments: No JVD.  Pulmonary:     Effort: Pulmonary effort is normal. No accessory muscle usage or respiratory distress.     Breath sounds: No wheezing or rhonchi.  Musculoskeletal:     Right lower leg: Edema (minimal) present.     Left lower leg: Edema (minimal) present.  Neurological:     Mental Status: She is alert.    Laboratory examination:   Recent Labs  06/15/21 1712 06/16/21 0644 06/20/21 0536  NA 137 136 138  K 4.2 4.0 4.3  CL 93* 95* 93*  CO2 33* 32 37*  GLUCOSE 98 240* 100*  BUN 52* 50* 36*  CREATININE 2.00* 1.59* 1.28*  CALCIUM 8.7* 8.7* 8.5*  GFRNONAA 24* 31* 41*   CrCl cannot be calculated (Patient's most recent lab result is older than the maximum 21 days allowed.).     Latest Ref Rng & Units 06/20/2021    5:36 AM 06/16/2021    6:44 AM 06/15/2021    5:12 PM  CMP  Glucose 70 - 99 mg/dL 100  240  98   BUN 8 - 23 mg/dL 36  50  52   Creatinine 0.44 - 1.00 mg/dL 1.28  1.59  2.00   Sodium 135 - 145 mmol/L 138  136  137   Potassium 3.5 - 5.1 mmol/L 4.3  4.0  4.2   Chloride 98 - 111 mmol/L 93  95  93   CO2 22 - 32 mmol/L 37  32  33   Calcium 8.9 - 10.3 mg/dL 8.5  8.7  8.7       Latest Ref Rng & Units 06/21/2021    4:47 AM 06/20/2021    5:24 AM 06/18/2021    4:38 AM  CBC  WBC 4.0 - 10.5 K/uL 7.6  7.4  8.7   Hemoglobin 12.0 - 15.0 g/dL 8.7  8.7  8.5   Hematocrit 36.0 - 46.0 % 29.4  29.6  28.0   Platelets 150 - 400 K/uL 376  377  367    Lipid Panel    Component Value Date/Time   CHOL 126 03/14/2019 1121   TRIG 82 03/14/2019 1121   HDL 49 03/14/2019 1121   CHOLHDL 5.1 CALC 12/19/2006 0916   VLDL 26 12/19/2006 0916   LDLCALC 61 03/14/2019 1121   LDLDIRECT 159.2 12/19/2006 0916   HEMOGLOBIN A1C No results found for:  "HGBA1C", "MPG" TSH No results for input(s): "TSH" in the last 8760 hours.  External labs:  02/22/2021: BUN 36, creatinine 1.3, GFR 39, sodium 130, potassium 5.4 Hgb 11.3, HCT 35.1, platelet 336 Magnesium 2.4  07/20/2020: Glucose 84, BUN 30, creatinine 1.22, GFR 48, sodium 137, potassium 4.9,  BNP 321  Cholesterol, total 164.000 m 06/21/2019 HDL 46 MG/DL 06/21/2019 LDL 99.000 mg 06/21/2019 Triglycerides 93.000 06/21/2019  04/14/2020: Serum glucose 86 mg, BUN 22, creatinine 1.13, EGFR 44 mL, sodium 138, potassium 4.3.  CMP otherwise normal. Hb 10.2/HCT 31.5, platelets 383.  02/17/2020:  Hemoglobin 11.1, hematocrit 34.2, platelets 383 BUN 26, creatinine 0.9, EGFR 59, sodium 136, potassium 4.6  01/07/2020: BUN 29, GFR 57, creatinine 0.93, BUN/CR 31, sodium 133, potassium 4.6, chloride 93, BMP otherwise normal  Cholesterol, total 164.000 m 06/21/2019 Triglycerides 93.000 06/21/2019 HDL 46 MG/DL 06/21/2019 LDL 99.000 mg 06/21/2019  Glucose Random 87.000 mg 06/21/2019 BUN 25.000 mg 06/21/2019 Creatinine, Serum 0.800 mg/ 06/21/2019  TSH 3.220 micr 06/21/2019  AST 18, ALT 11 09/14/2018  FOBT: Normal 04/24/2018  MicroAlbumin Urine 13.000 03/23/2017 MicroAlbumin/Creat 37.9 MG/ 03/23/2017  Allergies   Allergies  Allergen Reactions   Statins Other (See Comments)    Muscle aches and INTERNAL BLEEDING   Atorvastatin     Other reaction(s): Myalgias   Gabapentin Other (See Comments)    "Made me loopy"   Methocarbamol Other (See Comments)    "Made me loopy"    Medications Prior to Visit:   Outpatient Medications Prior to Visit  Medication Sig Dispense Refill  albuterol (PROVENTIL) (2.5 MG/3ML) 0.083% nebulizer solution Take 3 mLs (2.5 mg total) by nebulization every 6 (six) hours as needed for wheezing or shortness of breath. 75 mL 12   albuterol (VENTOLIN HFA) 108 (90 Base) MCG/ACT inhaler Inhale 2 puffs into the lungs every 6 (six) hours as needed for wheezing or shortness of  breath. 8 g 2   amiodarone (PACERONE) 100 MG tablet Take 2 tablets (200 mg total) by mouth daily. 90 tablet 2   apixaban (ELIQUIS) 2.5 MG TABS tablet Take 1 tablet (2.5 mg total) by mouth 2 (two) times daily. 180 tablet 3   Bempedoic Acid-Ezetimibe (NEXLIZET) 180-10 MG TABS Take 1 capsule by mouth daily. (Patient taking differently: Take 1 capsule by mouth daily. Taking it at bedtime) 30 tablet 3   budesonide (PULMICORT) 0.25 MG/2ML nebulizer solution Take 2 mLs (0.25 mg total) by nebulization 2 (two) times daily. 60 mL 12   carvedilol (COREG) 6.25 MG tablet Take 1 tablet (6.25 mg total) by mouth 2 (two) times daily with a meal for 14 days. 28 tablet 0   Cholecalciferol (VITAMIN D3) 125 MCG (5000 UT) CAPS Take 5,000 Units by mouth at bedtime.     diclofenac sodium (VOLTAREN) 1 % GEL Apply 2.25 g topically 3 (three) times daily as needed (for pain).      furosemide (LASIX) 20 MG tablet Take 1 tablet (20 mg total) by mouth daily as needed for fluid or edema. Take Lasix 20 mg once daily with an additional dose as needed for fluid overload. (Patient taking differently: Take 20 mg by mouth daily. Take 25m if needed for fluid overload) 90 tablet 3   HYDROcodone-acetaminophen (NORCO) 7.5-325 MG tablet Take 1 tablet by mouth every 6 (six) hours as needed for moderate pain. 20 tablet 0   latanoprost (XALATAN) 0.005 % ophthalmic solution Place 1 drop into both eyes at bedtime.     levothyroxine (SYNTHROID) 125 MCG tablet Take 125 mcg by mouth daily before breakfast.      losartan (COZAAR) 100 MG tablet Take 1 tablet (100 mg total) by mouth daily. TAKE ONE TABLET BY MOUTH AT NOON Strength: 100 mg 90 tablet 3   metolazone (ZAROXOLYN) 2.5 MG tablet Take 1 tablet (2.5 mg total) by mouth as needed (volume overload). 30 tablet 0   Multiple Vitamins-Minerals (ICAPS AREDS 2 PO) Take 1 capsule by mouth 2 (two) times daily.     pantoprazole (PROTONIX) 40 MG tablet Take 1 tablet (40 mg total) by mouth daily. Take  Protonix 40 mg daily after 08/20/2021 when on Eliquis. (Patient taking differently: Take 40 mg by mouth 2 (two) times daily. Take Protonix 40 mg daily after 08/20/2021 when on Eliquis.) 90 tablet 0   rOPINIRole (REQUIP) 1 MG tablet Take 1 mg by mouth See admin instructions. Take 1 mg by mouth once a day between 3 PM-4 PM and an additional 1 mg in the evening, if no relief  12   ferrous sulfate 325 (65 FE) MG tablet Take 1 tablet (325 mg total) by mouth daily with breakfast. 90 tablet 0   doxycycline (VIBRAMYCIN) 100 MG capsule Take 100 mg by mouth 2 (two) times daily.     No facility-administered medications prior to visit.   Final Medications at End of Visit    Current Meds  Medication Sig   albuterol (PROVENTIL) (2.5 MG/3ML) 0.083% nebulizer solution Take 3 mLs (2.5 mg total) by nebulization every 6 (six) hours as needed for wheezing or shortness of breath.  albuterol (VENTOLIN HFA) 108 (90 Base) MCG/ACT inhaler Inhale 2 puffs into the lungs every 6 (six) hours as needed for wheezing or shortness of breath.   amiodarone (PACERONE) 100 MG tablet Take 2 tablets (200 mg total) by mouth daily.   apixaban (ELIQUIS) 2.5 MG TABS tablet Take 1 tablet (2.5 mg total) by mouth 2 (two) times daily.   Bempedoic Acid-Ezetimibe (NEXLIZET) 180-10 MG TABS Take 1 capsule by mouth daily. (Patient taking differently: Take 1 capsule by mouth daily. Taking it at bedtime)   budesonide (PULMICORT) 0.25 MG/2ML nebulizer solution Take 2 mLs (0.25 mg total) by nebulization 2 (two) times daily.   carvedilol (COREG) 6.25 MG tablet Take 1 tablet (6.25 mg total) by mouth 2 (two) times daily with a meal for 14 days.   Cholecalciferol (VITAMIN D3) 125 MCG (5000 UT) CAPS Take 5,000 Units by mouth at bedtime.   diclofenac sodium (VOLTAREN) 1 % GEL Apply 2.25 g topically 3 (three) times daily as needed (for pain).    furosemide (LASIX) 20 MG tablet Take 1 tablet (20 mg total) by mouth daily as needed for fluid or edema. Take Lasix  20 mg once daily with an additional dose as needed for fluid overload. (Patient taking differently: Take 20 mg by mouth daily. Take $RemoveBef'20mg'aAVUgXXSCo$  if needed for fluid overload)   HYDROcodone-acetaminophen (NORCO) 7.5-325 MG tablet Take 1 tablet by mouth every 6 (six) hours as needed for moderate pain.   latanoprost (XALATAN) 0.005 % ophthalmic solution Place 1 drop into both eyes at bedtime.   levothyroxine (SYNTHROID) 125 MCG tablet Take 125 mcg by mouth daily before breakfast.    losartan (COZAAR) 100 MG tablet Take 1 tablet (100 mg total) by mouth daily. TAKE ONE TABLET BY MOUTH AT NOON Strength: 100 mg   metolazone (ZAROXOLYN) 2.5 MG tablet Take 1 tablet (2.5 mg total) by mouth as needed (volume overload).   Multiple Vitamins-Minerals (ICAPS AREDS 2 PO) Take 1 capsule by mouth 2 (two) times daily.   pantoprazole (PROTONIX) 40 MG tablet Take 1 tablet (40 mg total) by mouth daily. Take Protonix 40 mg daily after 08/20/2021 when on Eliquis. (Patient taking differently: Take 40 mg by mouth 2 (two) times daily. Take Protonix 40 mg daily after 08/20/2021 when on Eliquis.)   rOPINIRole (REQUIP) 1 MG tablet Take 1 mg by mouth See admin instructions. Take 1 mg by mouth once a day between 3 PM-4 PM and an additional 1 mg in the evening, if no relief    Radiology:   CT Abdomen and Pelvis W Contrast 12/20/2019: 1. Large Infrarenal Abdominal Aortic Aneurysm measuring up to 5.7 cm with mild perianeurysmal inflammation. Differential considerations include impending AAA rupture, mycotic AAA, symptomatic AAA. Recommend urgent transfer to emergency department and Vascular Surgery consultation. Aortic aneurysm NOS (ICD10-I71.9). Aortic Atherosclerosis (ICD10-I70.0). 2. No other acute or inflammatory process identified. 5 cm fat containing left pelvic hernia. Large bowel diverticulosis. 3. Simple appearing 4 cm left ovarian cyst, recommend follow-up Ultrasound in 6-12 months  Chest X-ray 12/20/2019:  Diffuse cardiac  enlargement. Emphysematous changes in the lungs. Coarse interstitial pattern to the lungs could represent fibrosis or edema. No focal consolidation. No pleural effusions. No pneumothorax. Calcified and tortuous aorta. Degenerative changes in the shoulders and spine. Old rib fractures. Similar appearance to previous study. IMPRESSION: 1. Cardiac enlargement. 2. Coarse interstitial pattern to the lungs could represent fibrosis or edema.  Abdominal X-Ray 12/20/2019:  Interval placement of aorto iliac stent graft. Nonobstructive bowel gas pattern.  Cardiac  Studies:   Lexiscan myoview stress test 12/08/2017: 1. Lexiscan stress test was performed. Exercise capacity was not assessed. Stress symptoms included abdominal pain. Blood pressure was normal. The resting and stress electrocardiogram demonstrated normal sinus rhythm, normal resting conduction, no resting arrhythmias and normal rest repolarization. 2. The overall quality of the study is good. There is no evidence of abnormal lung activity. Stress and rest SPECT images demonstrate homogeneous tracer distribution throughout the myocardium. Gated SPECT imaging reveals normal myocardial thickening and wall motion. The left ventricular ejection fraction was normal (54%). 3. Low risk study  Renal artery duplex 09/27/2017: Hemodynamically significant stenosis bilaterally. Renal length is within normal limits for both kidneys. There is an incidental 3.9x3.4x3.4 cm proximal and mid abdominal aortic aneurysm. Recommend dedicated abdominal aortic duplex scan to further define AAA.  Abdominal Aortic Duplex  01/01/2019: Moderate dilatation of the abdominal aorta is noted in the proximal, mid and distal aorta. An abdominal aortic aneurysm measuring 4.5 x 4.52 x 4.52 cm is seen.  Recheck in 6 months for stability of the aneurysm.  TEE 12/24/2019:  1. Left ventricular ejection fraction, by estimation, is 50 to 55%. The left ventricle has low normal  function. Left ventricular diastolic function could not be evaluated.   2. Right ventricular systolic function is normal. The right ventricular size is normal.   3. Left atrial size was moderately dilated. No left atrial/left atrial appendage thrombus was detected. The LAA emptying velocity was 30 cm/s.   4. The mitral valve is normal in structure. Mild mitral valve regurgitation. No evidence of mitral stenosis.   5. The aortic valve is calcified. Aortic valve regurgitation is not visualized. Mild aortic valve stenosis. Aortic valve mean gradient measures 10.8 mmHg.   6. There is Moderate (Grade III) layered plaque involving the ascending, transverse and descending aorta.   Ambulatory cardiac telemetry 14 days 03/22/2021 - 04/04/2021: Predominant underlying rhythm was sinus with first-degree AV block.  Patient with 19 brief episodes of SVT which were asymptomatic.  Frequent PVCs with 20.4% burden and episodes of ventricular bigeminy and trigeminy which correlated with patient triggered events.  No evidence of atrial fibrillation, ventricular tachycardia, pauses >3 seconds, or high degree AV block.  Echocardiogram 04/20/2021:  Normal LV systolic function with visual EF 55-60%. Left ventricle cavity  is normal in size. Moderate left ventricular hypertrophy. Normal global  wall motion. Indeterminate diastolic filling pattern.  Left atrial cavity is severely dilated.  Moderate aortic valve stenosis (peak velocity 3.69ms, MG 23.019mG, AVA  (VTI) measures 1.3 cm^2, DI 0.4).  Mild (Grade I) mitral regurgitation.  Mild tricuspid regurgitation. Mild pulmonary hypertension. RVSP measures  42 mmHg.  IVC is dilated with a respiratory response of >50%.  Compared to study 05/27/2020 LVEF remains preserved, G2DD is now  indeterminate, mild LAE is now severe, mild AS is now moderate, mild PHTN  is new finding (RVSP 4220m).   EKG  08/26/2021: Normal sinus rhythm at a rate of 62 bpm.   07/06/21: Sinus  rhythm at a rate of 59 bpm.  Normal axis.  Poor R wave progression, cannot exclude anteroseptal infarct old.  No evidence of ischemia or underlying injury pattern.  Compared EKG 06/09/2021, no significant change.  03/22/2021: Sinus rhythm with first-degree AV block with frequent PVCs at a rate of 86 bpm.  02/16/2021: Sinus rhythm with frequent PVCs in a trigeminal pattern and a rate of 90 bpm.  Normal axis.  Poor R wave progression, cannot exclude anteroseptal infarct old.  05/31/2020: Sinus  rhythm with borderline first-degree AV block at rate of 61 bpm, left atrial enlargement, normal axis.  Poor R wave progression, probably normal variant but cannot exclude anteroseptal infarct old.  No evidence of ischemia, normal QT interval. No significant change from 01/02/2020.  Assessment     ICD-10-CM   1. Chronic diastolic (congestive) heart failure (HCC)  I50.32     2. Paroxysmal atrial fibrillation (HCC)  I48.0 EKG 12-Lead     This patients CHA2DS2-VASc Score 6 (CHF, HTN, vasc, age, F) and yearly risk of stroke 9.8%.  No orders of the defined types were placed in this encounter.   Medications Discontinued During This Encounter  Medication Reason   doxycycline (VIBRAMYCIN) 100 MG capsule      Recommendations:   Lisa Mcclure  is a 88 y.o.  female  with past medical history of obesity, chronic diastolic CHF, hypertensive heart disease, paroxysmal atrial fibrillation, anemia, hemorrhoids, diverticulosis, esophageal reflux, depression, OSA intolerant to CPAP on nocturnal 02, rheumatoid arthritis, and venous insufficiency. CT scan of the chest 03/2016 without definitive ILD. LM and 2 vessel CAD, mild calcification on mitral and aortic valve. S/p AAA repair on 12/20/2019.  Her last episode of atrial fibrillation was in August 2021.    Patient admitted 06/15/21-06/21/21 with acute respiratory failure. Evaluation revealed anemia requiring transfusion, likely secondary to gastric ulcer.  Patient  admitted 06/15/21-06/21/21 with acute respiratory failure. Evaluation revealed anemia requiring transfusion, likely secondary to gastric ulcer. Patient was last seen in the office 08/26/2021 with worsening leg edema and shortness of breath. At that time added PRN metolazone. She now presents for follow up. Patients dyspnea and leg swelling have improved. Will continue present diuretic regimen. Patient will have repeat BMP done with PCP. Blood pressure is mildly elevated in the office today, but is typically well controlled at home. Will not make changes at this time.   Follow up in 3 months, sooner if needed.    Alethia Berthold, PA-C 10/07/2021, 1:35 PM Office: [phone]

## 2021-10-06 ENCOUNTER — Other Ambulatory Visit: Payer: Self-pay | Admitting: *Deleted

## 2021-10-06 NOTE — Patient Outreach (Addendum)
[facility]) Care Management  10/06/2021  Lisa Mcclure 30-Dec-1932 [id number]  Unsuccessful outreach attempt made to patient. RN Health Coach left HIPAA compliant voicemail message along with her contact information.  Plan: RN Health Coach will call patient within the month of July.  [name] RN, BSN [place] [phone] [name]

## 2021-10-07 ENCOUNTER — Ambulatory Visit: Payer: Medicare Other | Admitting: Student

## 2021-10-07 ENCOUNTER — Encounter: Payer: Self-pay | Admitting: Student

## 2021-10-07 VITALS — BP 148/64 | HR 65 | Temp 97.3°F | Resp 16 | Ht 63.5 in | Wt 210.2 lb

## 2021-10-07 DIAGNOSIS — I5032 Chronic diastolic (congestive) heart failure: Secondary | ICD-10-CM

## 2021-10-07 DIAGNOSIS — I480 Paroxysmal atrial fibrillation: Secondary | ICD-10-CM

## 2021-10-12 DIAGNOSIS — K259 Gastric ulcer, unspecified as acute or chronic, without hemorrhage or perforation: Secondary | ICD-10-CM | POA: Diagnosis not present

## 2021-10-12 DIAGNOSIS — I130 Hypertensive heart and chronic kidney disease with heart failure and stage 1 through stage 4 chronic kidney disease, or unspecified chronic kidney disease: Secondary | ICD-10-CM | POA: Diagnosis not present

## 2021-10-12 DIAGNOSIS — J9601 Acute respiratory failure with hypoxia: Secondary | ICD-10-CM | POA: Diagnosis not present

## 2021-10-12 DIAGNOSIS — J9602 Acute respiratory failure with hypercapnia: Secondary | ICD-10-CM | POA: Diagnosis not present

## 2021-10-12 DIAGNOSIS — I5032 Chronic diastolic (congestive) heart failure: Secondary | ICD-10-CM | POA: Diagnosis not present

## 2021-10-12 DIAGNOSIS — N1832 Chronic kidney disease, stage 3b: Secondary | ICD-10-CM | POA: Diagnosis not present

## 2021-10-13 ENCOUNTER — Telehealth: Payer: Self-pay

## 2021-10-13 NOTE — Telephone Encounter (Signed)
Please advise patient to take metolazone for an additional 2 days, then return to as needed.

## 2021-10-13 NOTE — Telephone Encounter (Signed)
Called and spoke to patient.

## 2021-10-14 DIAGNOSIS — H353114 Nonexudative age-related macular degeneration, right eye, advanced atrophic with subfoveal involvement: Secondary | ICD-10-CM | POA: Diagnosis not present

## 2021-10-16 DIAGNOSIS — J9602 Acute respiratory failure with hypercapnia: Secondary | ICD-10-CM | POA: Diagnosis not present

## 2021-10-16 DIAGNOSIS — J9601 Acute respiratory failure with hypoxia: Secondary | ICD-10-CM | POA: Diagnosis not present

## 2021-10-16 DIAGNOSIS — I130 Hypertensive heart and chronic kidney disease with heart failure and stage 1 through stage 4 chronic kidney disease, or unspecified chronic kidney disease: Secondary | ICD-10-CM | POA: Diagnosis not present

## 2021-10-16 DIAGNOSIS — K259 Gastric ulcer, unspecified as acute or chronic, without hemorrhage or perforation: Secondary | ICD-10-CM | POA: Diagnosis not present

## 2021-10-16 DIAGNOSIS — I5032 Chronic diastolic (congestive) heart failure: Secondary | ICD-10-CM | POA: Diagnosis not present

## 2021-10-16 DIAGNOSIS — N1832 Chronic kidney disease, stage 3b: Secondary | ICD-10-CM | POA: Diagnosis not present

## 2021-10-19 DIAGNOSIS — F3289 Other specified depressive episodes: Secondary | ICD-10-CM | POA: Diagnosis not present

## 2021-10-19 DIAGNOSIS — R600 Localized edema: Secondary | ICD-10-CM | POA: Diagnosis not present

## 2021-10-19 DIAGNOSIS — K259 Gastric ulcer, unspecified as acute or chronic, without hemorrhage or perforation: Secondary | ICD-10-CM | POA: Diagnosis not present

## 2021-10-19 DIAGNOSIS — J9601 Acute respiratory failure with hypoxia: Secondary | ICD-10-CM | POA: Diagnosis not present

## 2021-10-19 DIAGNOSIS — I5032 Chronic diastolic (congestive) heart failure: Secondary | ICD-10-CM | POA: Diagnosis not present

## 2021-10-19 DIAGNOSIS — N1832 Chronic kidney disease, stage 3b: Secondary | ICD-10-CM | POA: Diagnosis not present

## 2021-10-19 DIAGNOSIS — J9602 Acute respiratory failure with hypercapnia: Secondary | ICD-10-CM | POA: Diagnosis not present

## 2021-10-19 DIAGNOSIS — I130 Hypertensive heart and chronic kidney disease with heart failure and stage 1 through stage 4 chronic kidney disease, or unspecified chronic kidney disease: Secondary | ICD-10-CM | POA: Diagnosis not present

## 2021-10-19 DIAGNOSIS — D649 Anemia, unspecified: Secondary | ICD-10-CM | POA: Diagnosis not present

## 2021-10-20 DIAGNOSIS — K259 Gastric ulcer, unspecified as acute or chronic, without hemorrhage or perforation: Secondary | ICD-10-CM | POA: Diagnosis not present

## 2021-10-20 DIAGNOSIS — J9602 Acute respiratory failure with hypercapnia: Secondary | ICD-10-CM | POA: Diagnosis not present

## 2021-10-20 DIAGNOSIS — I130 Hypertensive heart and chronic kidney disease with heart failure and stage 1 through stage 4 chronic kidney disease, or unspecified chronic kidney disease: Secondary | ICD-10-CM | POA: Diagnosis not present

## 2021-10-20 DIAGNOSIS — N1832 Chronic kidney disease, stage 3b: Secondary | ICD-10-CM | POA: Diagnosis not present

## 2021-10-20 DIAGNOSIS — I5032 Chronic diastolic (congestive) heart failure: Secondary | ICD-10-CM | POA: Diagnosis not present

## 2021-10-20 DIAGNOSIS — J9601 Acute respiratory failure with hypoxia: Secondary | ICD-10-CM | POA: Diagnosis not present

## 2021-10-21 DIAGNOSIS — N189 Chronic kidney disease, unspecified: Secondary | ICD-10-CM | POA: Diagnosis not present

## 2021-10-21 DIAGNOSIS — I872 Venous insufficiency (chronic) (peripheral): Secondary | ICD-10-CM | POA: Diagnosis not present

## 2021-10-21 DIAGNOSIS — J9602 Acute respiratory failure with hypercapnia: Secondary | ICD-10-CM | POA: Diagnosis not present

## 2021-10-21 DIAGNOSIS — K219 Gastro-esophageal reflux disease without esophagitis: Secondary | ICD-10-CM | POA: Diagnosis not present

## 2021-10-21 DIAGNOSIS — N1832 Chronic kidney disease, stage 3b: Secondary | ICD-10-CM | POA: Diagnosis not present

## 2021-10-21 DIAGNOSIS — K259 Gastric ulcer, unspecified as acute or chronic, without hemorrhage or perforation: Secondary | ICD-10-CM | POA: Diagnosis not present

## 2021-10-21 DIAGNOSIS — J45909 Unspecified asthma, uncomplicated: Secondary | ICD-10-CM | POA: Diagnosis not present

## 2021-10-21 DIAGNOSIS — K5790 Diverticulosis of intestine, part unspecified, without perforation or abscess without bleeding: Secondary | ICD-10-CM | POA: Diagnosis not present

## 2021-10-21 DIAGNOSIS — G4700 Insomnia, unspecified: Secondary | ICD-10-CM | POA: Diagnosis not present

## 2021-10-21 DIAGNOSIS — M25512 Pain in left shoulder: Secondary | ICD-10-CM | POA: Diagnosis not present

## 2021-10-21 DIAGNOSIS — I480 Paroxysmal atrial fibrillation: Secondary | ICD-10-CM | POA: Diagnosis not present

## 2021-10-21 DIAGNOSIS — J9601 Acute respiratory failure with hypoxia: Secondary | ICD-10-CM | POA: Diagnosis not present

## 2021-10-21 DIAGNOSIS — D631 Anemia in chronic kidney disease: Secondary | ICD-10-CM | POA: Diagnosis not present

## 2021-10-21 DIAGNOSIS — I5032 Chronic diastolic (congestive) heart failure: Secondary | ICD-10-CM | POA: Diagnosis not present

## 2021-10-21 DIAGNOSIS — F32A Depression, unspecified: Secondary | ICD-10-CM | POA: Diagnosis not present

## 2021-10-21 DIAGNOSIS — R1313 Dysphagia, pharyngeal phase: Secondary | ICD-10-CM | POA: Diagnosis not present

## 2021-10-21 DIAGNOSIS — G2581 Restless legs syndrome: Secondary | ICD-10-CM | POA: Diagnosis not present

## 2021-10-21 DIAGNOSIS — M1990 Unspecified osteoarthritis, unspecified site: Secondary | ICD-10-CM | POA: Diagnosis not present

## 2021-10-21 DIAGNOSIS — G4733 Obstructive sleep apnea (adult) (pediatric): Secondary | ICD-10-CM | POA: Diagnosis not present

## 2021-10-21 DIAGNOSIS — D509 Iron deficiency anemia, unspecified: Secondary | ICD-10-CM | POA: Diagnosis not present

## 2021-10-21 DIAGNOSIS — N3281 Overactive bladder: Secondary | ICD-10-CM | POA: Diagnosis not present

## 2021-10-21 DIAGNOSIS — I130 Hypertensive heart and chronic kidney disease with heart failure and stage 1 through stage 4 chronic kidney disease, or unspecified chronic kidney disease: Secondary | ICD-10-CM | POA: Diagnosis not present

## 2021-10-21 DIAGNOSIS — I714 Abdominal aortic aneurysm, without rupture, unspecified: Secondary | ICD-10-CM | POA: Diagnosis not present

## 2021-10-21 DIAGNOSIS — M25511 Pain in right shoulder: Secondary | ICD-10-CM | POA: Diagnosis not present

## 2021-10-21 DIAGNOSIS — E039 Hypothyroidism, unspecified: Secondary | ICD-10-CM | POA: Diagnosis not present

## 2021-10-21 DIAGNOSIS — M069 Rheumatoid arthritis, unspecified: Secondary | ICD-10-CM | POA: Diagnosis not present

## 2021-10-25 ENCOUNTER — Other Ambulatory Visit: Payer: Self-pay | Admitting: *Deleted

## 2021-10-25 DIAGNOSIS — Z8679 Personal history of other diseases of the circulatory system: Secondary | ICD-10-CM

## 2021-10-26 ENCOUNTER — Ambulatory Visit: Payer: Self-pay

## 2021-10-26 ENCOUNTER — Ambulatory Visit (INDEPENDENT_AMBULATORY_CARE_PROVIDER_SITE_OTHER): Payer: Medicare Other | Admitting: Physical Medicine and Rehabilitation

## 2021-10-26 ENCOUNTER — Other Ambulatory Visit: Payer: Self-pay | Admitting: *Deleted

## 2021-10-26 ENCOUNTER — Encounter: Payer: Self-pay | Admitting: Physical Medicine and Rehabilitation

## 2021-10-26 DIAGNOSIS — I5032 Chronic diastolic (congestive) heart failure: Secondary | ICD-10-CM | POA: Diagnosis not present

## 2021-10-26 DIAGNOSIS — G8929 Other chronic pain: Secondary | ICD-10-CM | POA: Diagnosis not present

## 2021-10-26 DIAGNOSIS — K259 Gastric ulcer, unspecified as acute or chronic, without hemorrhage or perforation: Secondary | ICD-10-CM | POA: Diagnosis not present

## 2021-10-26 DIAGNOSIS — J9601 Acute respiratory failure with hypoxia: Secondary | ICD-10-CM | POA: Diagnosis not present

## 2021-10-26 DIAGNOSIS — R399 Unspecified symptoms and signs involving the genitourinary system: Secondary | ICD-10-CM | POA: Diagnosis not present

## 2021-10-26 DIAGNOSIS — M25511 Pain in right shoulder: Secondary | ICD-10-CM | POA: Diagnosis not present

## 2021-10-26 DIAGNOSIS — I130 Hypertensive heart and chronic kidney disease with heart failure and stage 1 through stage 4 chronic kidney disease, or unspecified chronic kidney disease: Secondary | ICD-10-CM | POA: Diagnosis not present

## 2021-10-26 DIAGNOSIS — J9602 Acute respiratory failure with hypercapnia: Secondary | ICD-10-CM | POA: Diagnosis not present

## 2021-10-26 DIAGNOSIS — M25512 Pain in left shoulder: Secondary | ICD-10-CM | POA: Diagnosis not present

## 2021-10-26 DIAGNOSIS — N390 Urinary tract infection, site not specified: Secondary | ICD-10-CM | POA: Diagnosis not present

## 2021-10-26 DIAGNOSIS — N1832 Chronic kidney disease, stage 3b: Secondary | ICD-10-CM | POA: Diagnosis not present

## 2021-10-26 NOTE — Progress Notes (Signed)
Pt state both shoulder pain. Pt state getting out of bed and any movement with her arms makes the pain worse. Pt state she takes over the counter pain meds to help ease her pain.  Numeric Pain Rating Scale and Functional Assessment Average Pain 5   In the last MONTH (on 0-10 scale) has pain interfered with the following?  1. General activity like being  able to carry out your everyday physical activities such as walking, climbing stairs, carrying groceries, or moving a chair?  Rating(10)   +BT, -Dye Allergies.

## 2021-10-26 NOTE — Patient Outreach (Signed)
[facility]) Care Management  10/26/2021  Lisa Mcclure 1933/02/05 [id number]  Nurse left VM with patient to explain that Dr. Philip Aspen has a Nurse Care Manager who is part of his care team and they will   follow up with the patient's care. Nurse requested that the patient call this nurse back with any questions or concerns.  Plan: RN Health Coach will close case.    [name] RN, BSN [place] [phone] [email or web address]

## 2021-10-27 ENCOUNTER — Telehealth: Payer: Self-pay | Admitting: Student

## 2021-10-27 NOTE — Telephone Encounter (Signed)
Patient says she has bruising very badly across her body. She wonders if eliquis can cause severe bruising. She would like to speak with someone more about this.

## 2021-10-27 NOTE — Telephone Encounter (Signed)
Please advise patient that Eliquis can certainly contribute to easy bruising. If she is able to she is welcome to send photos for me to take a look as through [employer]. Otherwise if she feels it is significantly enough to be seen then she can follow up with [place] of PCP.

## 2021-10-28 ENCOUNTER — Encounter: Payer: Self-pay | Admitting: Cardiology

## 2021-10-28 DIAGNOSIS — K259 Gastric ulcer, unspecified as acute or chronic, without hemorrhage or perforation: Secondary | ICD-10-CM | POA: Diagnosis not present

## 2021-10-28 DIAGNOSIS — J9601 Acute respiratory failure with hypoxia: Secondary | ICD-10-CM | POA: Diagnosis not present

## 2021-10-28 DIAGNOSIS — I130 Hypertensive heart and chronic kidney disease with heart failure and stage 1 through stage 4 chronic kidney disease, or unspecified chronic kidney disease: Secondary | ICD-10-CM | POA: Diagnosis not present

## 2021-10-28 DIAGNOSIS — N1832 Chronic kidney disease, stage 3b: Secondary | ICD-10-CM | POA: Diagnosis not present

## 2021-10-28 DIAGNOSIS — J9602 Acute respiratory failure with hypercapnia: Secondary | ICD-10-CM | POA: Diagnosis not present

## 2021-10-28 DIAGNOSIS — I5032 Chronic diastolic (congestive) heart failure: Secondary | ICD-10-CM | POA: Diagnosis not present

## 2021-10-28 NOTE — Telephone Encounter (Signed)
From patient.

## 2021-10-28 NOTE — Telephone Encounter (Signed)
Called and spoke to pt, she is having someone send [place] photos through mychart.

## 2021-10-28 NOTE — Telephone Encounter (Signed)
Patient has sent photos they have been forward to [facility]

## 2021-10-28 NOTE — Telephone Encounter (Signed)
Please call patient and let her know I have reviewed the photos. Bruising does not appear significant enough that I would stop the Eliquis or recommend further workup.

## 2021-11-01 NOTE — Telephone Encounter (Signed)
Called and spoke to patient she said she doesn't want to stop medication she was just wondering if she is taking "too much" please advise

## 2021-11-01 NOTE — Telephone Encounter (Signed)
Lower dose would not effectively address her stroke risk.  Advised that she continue current medications

## 2021-11-02 DIAGNOSIS — J9601 Acute respiratory failure with hypoxia: Secondary | ICD-10-CM | POA: Diagnosis not present

## 2021-11-02 DIAGNOSIS — N1832 Chronic kidney disease, stage 3b: Secondary | ICD-10-CM | POA: Diagnosis not present

## 2021-11-02 DIAGNOSIS — R399 Unspecified symptoms and signs involving the genitourinary system: Secondary | ICD-10-CM | POA: Diagnosis not present

## 2021-11-02 DIAGNOSIS — I5032 Chronic diastolic (congestive) heart failure: Secondary | ICD-10-CM | POA: Diagnosis not present

## 2021-11-02 DIAGNOSIS — I130 Hypertensive heart and chronic kidney disease with heart failure and stage 1 through stage 4 chronic kidney disease, or unspecified chronic kidney disease: Secondary | ICD-10-CM | POA: Diagnosis not present

## 2021-11-02 DIAGNOSIS — J9602 Acute respiratory failure with hypercapnia: Secondary | ICD-10-CM | POA: Diagnosis not present

## 2021-11-02 DIAGNOSIS — N390 Urinary tract infection, site not specified: Secondary | ICD-10-CM | POA: Diagnosis not present

## 2021-11-02 DIAGNOSIS — K259 Gastric ulcer, unspecified as acute or chronic, without hemorrhage or perforation: Secondary | ICD-10-CM | POA: Diagnosis not present

## 2021-11-02 NOTE — Telephone Encounter (Signed)
Called and spoke to patient she voiced understanding

## 2021-11-04 DIAGNOSIS — I5032 Chronic diastolic (congestive) heart failure: Secondary | ICD-10-CM | POA: Diagnosis not present

## 2021-11-04 DIAGNOSIS — J9601 Acute respiratory failure with hypoxia: Secondary | ICD-10-CM | POA: Diagnosis not present

## 2021-11-04 DIAGNOSIS — J9602 Acute respiratory failure with hypercapnia: Secondary | ICD-10-CM | POA: Diagnosis not present

## 2021-11-04 DIAGNOSIS — K259 Gastric ulcer, unspecified as acute or chronic, without hemorrhage or perforation: Secondary | ICD-10-CM | POA: Diagnosis not present

## 2021-11-04 DIAGNOSIS — I130 Hypertensive heart and chronic kidney disease with heart failure and stage 1 through stage 4 chronic kidney disease, or unspecified chronic kidney disease: Secondary | ICD-10-CM | POA: Diagnosis not present

## 2021-11-04 DIAGNOSIS — N1832 Chronic kidney disease, stage 3b: Secondary | ICD-10-CM | POA: Diagnosis not present

## 2021-11-05 ENCOUNTER — Ambulatory Visit: Payer: Self-pay | Admitting: *Deleted

## 2021-11-08 DIAGNOSIS — J9602 Acute respiratory failure with hypercapnia: Secondary | ICD-10-CM | POA: Diagnosis not present

## 2021-11-08 DIAGNOSIS — I5032 Chronic diastolic (congestive) heart failure: Secondary | ICD-10-CM | POA: Diagnosis not present

## 2021-11-08 DIAGNOSIS — I130 Hypertensive heart and chronic kidney disease with heart failure and stage 1 through stage 4 chronic kidney disease, or unspecified chronic kidney disease: Secondary | ICD-10-CM | POA: Diagnosis not present

## 2021-11-08 DIAGNOSIS — E039 Hypothyroidism, unspecified: Secondary | ICD-10-CM | POA: Diagnosis not present

## 2021-11-08 DIAGNOSIS — K259 Gastric ulcer, unspecified as acute or chronic, without hemorrhage or perforation: Secondary | ICD-10-CM | POA: Diagnosis not present

## 2021-11-08 DIAGNOSIS — L659 Nonscarring hair loss, unspecified: Secondary | ICD-10-CM | POA: Diagnosis not present

## 2021-11-08 DIAGNOSIS — N1832 Chronic kidney disease, stage 3b: Secondary | ICD-10-CM | POA: Diagnosis not present

## 2021-11-08 DIAGNOSIS — J9601 Acute respiratory failure with hypoxia: Secondary | ICD-10-CM | POA: Diagnosis not present

## 2021-11-08 MED ORDER — TRIAMCINOLONE ACETONIDE 40 MG/ML IJ SUSP
40.0000 mg | INTRAMUSCULAR | Status: AC | PRN
  Administered 2021-10-26: 40 mg via INTRA_ARTICULAR

## 2021-11-08 MED ORDER — BUPIVACAINE HCL 0.25 % IJ SOLN
5.0000 mL | INTRAMUSCULAR | Status: AC | PRN
  Administered 2021-10-26: 5 mL via INTRA_ARTICULAR

## 2021-11-08 NOTE — Progress Notes (Signed)
   Lisa Mcclure - 88 y.o. female MRN [id number]  Date of birth: 04-25-1932  Office Visit Note: Visit Date: 10/26/2021 PCP: [name], MD Referred by: [name], MD  Subjective: Chief Complaint  Patient presents with   Right Shoulder - Pain   Left Shoulder - Pain   HPI:  Lisa Mcclure is a 88 y.o. female who comes in today at the request of Barnet Pall, FNP for planned Bilateral anesthetic glenohumeral arthrogram with fluoroscopic guidance.  The patient has failed conservative care including home exercise, medications, time and activity modification.  This injection will be diagnostic and hopefully therapeutic.  Please see requesting physician notes for further details and justification.   ROS Otherwise per HPI.  Assessment & Plan: Visit Diagnoses:    ICD-10-CM   1. Chronic pain of both shoulders  M25.511 XR C-ARM NO REPORT   G89.29    M25.512       Plan: No additional findings.   Meds & Orders: No orders of the defined types were placed in this encounter.   Orders Placed This Encounter  Procedures   Large Joint Inj   XR C-ARM NO REPORT    Follow-up: Return for visit to requesting provider as needed.   Procedures: Large Joint Inj: bilateral glenohumeral on 10/26/2021 1:30 PM Indications: pain and diagnostic evaluation Details: 22 G 3.5 in needle, fluoroscopy-guided anteromedial approach  Arthrogram: No  Medications (Right): 40 mg triamcinolone acetonide 40 MG/ML; 5 mL bupivacaine 0.25 % Medications (Left): 40 mg triamcinolone acetonide 40 MG/ML; 5 mL bupivacaine 0.25 % Outcome: tolerated well, no immediate complications  There was excellent flow of contrast producing a partial arthrogram of the glenohumeral joint. The patient did have relief of symptoms during the anesthetic phase of the injection. Procedure, treatment alternatives, risks and benefits explained, specific risks discussed. Consent was given by the patient. Immediately prior  to procedure a time out was called to verify the correct patient, procedure, equipment, support staff and site/side marked as required. Patient was prepped and draped in the usual sterile fashion.          Clinical History: No specialty comments available.     Objective:  VS:  HT:    WT:   BMI:     BP:   HR: bpm  TEMP: ( )  RESP:  Physical Exam   Imaging: No results found.

## 2021-11-09 ENCOUNTER — Other Ambulatory Visit: Payer: Self-pay | Admitting: Student

## 2021-11-09 DIAGNOSIS — I10 Essential (primary) hypertension: Secondary | ICD-10-CM

## 2021-11-09 DIAGNOSIS — I5032 Chronic diastolic (congestive) heart failure: Secondary | ICD-10-CM

## 2021-11-11 DIAGNOSIS — J9602 Acute respiratory failure with hypercapnia: Secondary | ICD-10-CM | POA: Diagnosis not present

## 2021-11-11 DIAGNOSIS — I5032 Chronic diastolic (congestive) heart failure: Secondary | ICD-10-CM | POA: Diagnosis not present

## 2021-11-11 DIAGNOSIS — J9601 Acute respiratory failure with hypoxia: Secondary | ICD-10-CM | POA: Diagnosis not present

## 2021-11-11 DIAGNOSIS — N1832 Chronic kidney disease, stage 3b: Secondary | ICD-10-CM | POA: Diagnosis not present

## 2021-11-11 DIAGNOSIS — I130 Hypertensive heart and chronic kidney disease with heart failure and stage 1 through stage 4 chronic kidney disease, or unspecified chronic kidney disease: Secondary | ICD-10-CM | POA: Diagnosis not present

## 2021-11-11 DIAGNOSIS — K259 Gastric ulcer, unspecified as acute or chronic, without hemorrhage or perforation: Secondary | ICD-10-CM | POA: Diagnosis not present

## 2021-11-16 ENCOUNTER — Ambulatory Visit: Payer: Medicare Other | Admitting: Vascular Surgery

## 2021-11-16 ENCOUNTER — Other Ambulatory Visit (HOSPITAL_COMMUNITY): Payer: Medicare Other

## 2021-11-17 DIAGNOSIS — I5032 Chronic diastolic (congestive) heart failure: Secondary | ICD-10-CM | POA: Diagnosis not present

## 2021-11-17 DIAGNOSIS — N1832 Chronic kidney disease, stage 3b: Secondary | ICD-10-CM | POA: Diagnosis not present

## 2021-11-17 DIAGNOSIS — J9601 Acute respiratory failure with hypoxia: Secondary | ICD-10-CM | POA: Diagnosis not present

## 2021-11-17 DIAGNOSIS — K259 Gastric ulcer, unspecified as acute or chronic, without hemorrhage or perforation: Secondary | ICD-10-CM | POA: Diagnosis not present

## 2021-11-17 DIAGNOSIS — I130 Hypertensive heart and chronic kidney disease with heart failure and stage 1 through stage 4 chronic kidney disease, or unspecified chronic kidney disease: Secondary | ICD-10-CM | POA: Diagnosis not present

## 2021-11-17 DIAGNOSIS — J9602 Acute respiratory failure with hypercapnia: Secondary | ICD-10-CM | POA: Diagnosis not present

## 2021-11-20 DIAGNOSIS — K219 Gastro-esophageal reflux disease without esophagitis: Secondary | ICD-10-CM | POA: Diagnosis not present

## 2021-11-20 DIAGNOSIS — M25512 Pain in left shoulder: Secondary | ICD-10-CM | POA: Diagnosis not present

## 2021-11-20 DIAGNOSIS — J9602 Acute respiratory failure with hypercapnia: Secondary | ICD-10-CM | POA: Diagnosis not present

## 2021-11-20 DIAGNOSIS — K259 Gastric ulcer, unspecified as acute or chronic, without hemorrhage or perforation: Secondary | ICD-10-CM | POA: Diagnosis not present

## 2021-11-20 DIAGNOSIS — R1313 Dysphagia, pharyngeal phase: Secondary | ICD-10-CM | POA: Diagnosis not present

## 2021-11-20 DIAGNOSIS — I480 Paroxysmal atrial fibrillation: Secondary | ICD-10-CM | POA: Diagnosis not present

## 2021-11-20 DIAGNOSIS — G4733 Obstructive sleep apnea (adult) (pediatric): Secondary | ICD-10-CM | POA: Diagnosis not present

## 2021-11-20 DIAGNOSIS — J9601 Acute respiratory failure with hypoxia: Secondary | ICD-10-CM | POA: Diagnosis not present

## 2021-11-20 DIAGNOSIS — I5032 Chronic diastolic (congestive) heart failure: Secondary | ICD-10-CM | POA: Diagnosis not present

## 2021-11-20 DIAGNOSIS — N1832 Chronic kidney disease, stage 3b: Secondary | ICD-10-CM | POA: Diagnosis not present

## 2021-11-20 DIAGNOSIS — I714 Abdominal aortic aneurysm, without rupture, unspecified: Secondary | ICD-10-CM | POA: Diagnosis not present

## 2021-11-20 DIAGNOSIS — D631 Anemia in chronic kidney disease: Secondary | ICD-10-CM | POA: Diagnosis not present

## 2021-11-20 DIAGNOSIS — M1990 Unspecified osteoarthritis, unspecified site: Secondary | ICD-10-CM | POA: Diagnosis not present

## 2021-11-20 DIAGNOSIS — M069 Rheumatoid arthritis, unspecified: Secondary | ICD-10-CM | POA: Diagnosis not present

## 2021-11-20 DIAGNOSIS — M25511 Pain in right shoulder: Secondary | ICD-10-CM | POA: Diagnosis not present

## 2021-11-20 DIAGNOSIS — G2581 Restless legs syndrome: Secondary | ICD-10-CM | POA: Diagnosis not present

## 2021-11-20 DIAGNOSIS — J45909 Unspecified asthma, uncomplicated: Secondary | ICD-10-CM | POA: Diagnosis not present

## 2021-11-20 DIAGNOSIS — I872 Venous insufficiency (chronic) (peripheral): Secondary | ICD-10-CM | POA: Diagnosis not present

## 2021-11-20 DIAGNOSIS — F32A Depression, unspecified: Secondary | ICD-10-CM | POA: Diagnosis not present

## 2021-11-20 DIAGNOSIS — E039 Hypothyroidism, unspecified: Secondary | ICD-10-CM | POA: Diagnosis not present

## 2021-11-20 DIAGNOSIS — D509 Iron deficiency anemia, unspecified: Secondary | ICD-10-CM | POA: Diagnosis not present

## 2021-11-20 DIAGNOSIS — K5790 Diverticulosis of intestine, part unspecified, without perforation or abscess without bleeding: Secondary | ICD-10-CM | POA: Diagnosis not present

## 2021-11-20 DIAGNOSIS — I130 Hypertensive heart and chronic kidney disease with heart failure and stage 1 through stage 4 chronic kidney disease, or unspecified chronic kidney disease: Secondary | ICD-10-CM | POA: Diagnosis not present

## 2021-11-20 DIAGNOSIS — G4700 Insomnia, unspecified: Secondary | ICD-10-CM | POA: Diagnosis not present

## 2021-11-20 DIAGNOSIS — N3281 Overactive bladder: Secondary | ICD-10-CM | POA: Diagnosis not present

## 2021-11-27 DIAGNOSIS — K259 Gastric ulcer, unspecified as acute or chronic, without hemorrhage or perforation: Secondary | ICD-10-CM | POA: Diagnosis not present

## 2021-11-27 DIAGNOSIS — J9601 Acute respiratory failure with hypoxia: Secondary | ICD-10-CM | POA: Diagnosis not present

## 2021-11-27 DIAGNOSIS — I130 Hypertensive heart and chronic kidney disease with heart failure and stage 1 through stage 4 chronic kidney disease, or unspecified chronic kidney disease: Secondary | ICD-10-CM | POA: Diagnosis not present

## 2021-11-27 DIAGNOSIS — I5032 Chronic diastolic (congestive) heart failure: Secondary | ICD-10-CM | POA: Diagnosis not present

## 2021-11-27 DIAGNOSIS — N1832 Chronic kidney disease, stage 3b: Secondary | ICD-10-CM | POA: Diagnosis not present

## 2021-11-27 DIAGNOSIS — J9602 Acute respiratory failure with hypercapnia: Secondary | ICD-10-CM | POA: Diagnosis not present

## 2021-11-29 ENCOUNTER — Telehealth: Payer: Self-pay

## 2021-11-29 DIAGNOSIS — I5032 Chronic diastolic (congestive) heart failure: Secondary | ICD-10-CM | POA: Diagnosis not present

## 2021-11-29 DIAGNOSIS — J9602 Acute respiratory failure with hypercapnia: Secondary | ICD-10-CM | POA: Diagnosis not present

## 2021-11-29 DIAGNOSIS — I130 Hypertensive heart and chronic kidney disease with heart failure and stage 1 through stage 4 chronic kidney disease, or unspecified chronic kidney disease: Secondary | ICD-10-CM | POA: Diagnosis not present

## 2021-11-29 DIAGNOSIS — K259 Gastric ulcer, unspecified as acute or chronic, without hemorrhage or perforation: Secondary | ICD-10-CM | POA: Diagnosis not present

## 2021-11-29 DIAGNOSIS — N1832 Chronic kidney disease, stage 3b: Secondary | ICD-10-CM | POA: Diagnosis not present

## 2021-11-29 DIAGNOSIS — J9601 Acute respiratory failure with hypoxia: Secondary | ICD-10-CM | POA: Diagnosis not present

## 2021-11-29 NOTE — Telephone Encounter (Signed)
BMP and BNP has been ordered by [facility]. Can she get this done tomorrow

## 2021-11-29 NOTE — Telephone Encounter (Signed)
Beth, RN from [place] [facility]  She called to inform you that patient has had a 5lbs weight gain in a week and 2lbs were just overnight. She does not see any visible swelling on the patient and patient denied any SOB, but did complain of blurred vision and headaches. Please advise.

## 2021-11-30 DIAGNOSIS — K259 Gastric ulcer, unspecified as acute or chronic, without hemorrhage or perforation: Secondary | ICD-10-CM | POA: Diagnosis not present

## 2021-11-30 DIAGNOSIS — N1832 Chronic kidney disease, stage 3b: Secondary | ICD-10-CM | POA: Diagnosis not present

## 2021-11-30 DIAGNOSIS — I5032 Chronic diastolic (congestive) heart failure: Secondary | ICD-10-CM | POA: Diagnosis not present

## 2021-11-30 DIAGNOSIS — I130 Hypertensive heart and chronic kidney disease with heart failure and stage 1 through stage 4 chronic kidney disease, or unspecified chronic kidney disease: Secondary | ICD-10-CM | POA: Diagnosis not present

## 2021-11-30 DIAGNOSIS — J9602 Acute respiratory failure with hypercapnia: Secondary | ICD-10-CM | POA: Diagnosis not present

## 2021-11-30 DIAGNOSIS — J9601 Acute respiratory failure with hypoxia: Secondary | ICD-10-CM | POA: Diagnosis not present

## 2021-11-30 NOTE — Telephone Encounter (Signed)
Patient aware, she will go tomorrow.

## 2021-12-01 ENCOUNTER — Encounter: Payer: Self-pay | Admitting: Pulmonary Disease

## 2021-12-01 ENCOUNTER — Ambulatory Visit (INDEPENDENT_AMBULATORY_CARE_PROVIDER_SITE_OTHER): Payer: Medicare Other | Admitting: Pulmonary Disease

## 2021-12-01 VITALS — BP 120/70 | HR 67 | Ht 63.5 in | Wt 206.4 lb

## 2021-12-01 DIAGNOSIS — J4540 Moderate persistent asthma, uncomplicated: Secondary | ICD-10-CM

## 2021-12-01 DIAGNOSIS — I5032 Chronic diastolic (congestive) heart failure: Secondary | ICD-10-CM | POA: Diagnosis not present

## 2021-12-01 NOTE — Patient Instructions (Signed)
Follow up in 6 months 

## 2021-12-01 NOTE — Progress Notes (Signed)
[facility]  Chief Complaint  Patient presents with   Follow-up    Past Surgical History:  She  has a past surgical history that includes Appendectomy (4128); Vesicovaginal fistula closure w/ TAH; breast biopsy (Right, 1980); Knee arthroscopy w/ autogenous cartilage implantation procedure (Left, 1994, 1995); knee arthroscopy (Right, 1996, 2010); Shoulder surgery (1990); Cystocele repair; Cesarean section; Wrist surgery (7867); Total abdominal hysterectomy (1972); bladder abduction-1996 (1996); Cosmetic surgery (1996); Flexible sigmoidoscopy (N/A, 04/10/2015); Abdominal aortic endovascular stent graft (N/A, 12/20/2019); Ultrasound guidance for vascular access (Bilateral, 12/20/2019); TEE without cardioversion (N/A, 12/24/2019); Cardioversion (N/A, 12/24/2019); Refractive surgery (01/2020); Esophagogastroduodenoscopy (egd) with propofol (N/A, 06/18/2021); and biopsy (06/18/2021).  Past Medical History:  Diastolic CHF, Depression, Diverticulosis, GERD, Hypothyroidism, Insomnia, Sciatica, Overactive bladder, OA, Paroxysmal atrial fibrillation, Rheumatoid arthritis, Hypertension, CKD 3b  Constitutional:  BP 120/70 (BP Location: Left Arm)   Pulse 67   Ht 5' 3.5" (1.613 m)   Wt 206 lb 6.4 oz (93.6 kg)   SpO2 92%   BMI 35.99 kg/m   Brief Summary:  Lisa Mcclure is a 88 y.o. female former smoker with chronic respiratory failure and atypical pneumonia.  She had BOOP in the setting of rheumatoid arthritis in 2010.  She has history of obstructive sleep apnea, but is intolerant of CPAP.      Subjective:   Breathing about the same.  Has nasal drainage.  Occasional cough.  No wheeze or chest congestion.  Pulmicort helps.  Hasn't needed albuterol.  She misses travel with her family.  Physical Exam:   Appearance - well kempt   ENMT - no sinus tenderness, no oral exudate, no LAN, Mallampati 3 airway, no stridor  Respiratory - equal breath sounds  bilaterally, no wheezing or rales  CV - s1s2 regular rate and rhythm, no murmurs  Ext - no clubbing, no edema  Skin - no rashes  Psych - normal mood and affect    Pulmonary testing:  PFT 02/11/16 >> FEV1 1.45 (79%), FEV1% 81, TLC 3.75 (76%), DLCO 65% ABG 06/19/21 >> pH 7.41, PCO2  62, PO2 82  Chest Imaging:  CT chest 06/18/21 >> patchy GGO in RUL and RLL, scarr RUL  Sleep Tests:  PSG 08/13/19 >> AHI 48.9, SpO2 low 58%  Cardiac Tests:  Echo 04/20/21 >> EF 55 to 60%, mod LVH, severe LA dilation, mod AS, mild MR, mild TR, RVSP 42 mmHg  Social History:  She  reports that she quit smoking about 37 years ago. Her smoking use included cigarettes. She has a 2.00 pack-year smoking history. She has been exposed to tobacco smoke. She has never used smokeless tobacco. She reports current alcohol use. She reports that she does not use drugs.  Family History:  Her family history includes Breast cancer in her maternal aunt; COPD in her father; Heart attack (age of onset: 52) in her maternal grandmother; Lung cancer in her mother and son.     Assessment/Plan:   Chronic cough. - from post nasal drip, reflux, and reactive airways  - continue pulmicort - prn albuterol - prn flonase - continue protonix  Sleep disordered breathing. - intolerant of PAP therapy - continue 2 liters oxygen at night - she sleeps in adjustable bed with upper body elevated  Gastric ulcer, reflux. - follow up with [facility] gastroenterology  Paroxysmal atrial fibrillation, Chronic diastolic CHF, Aortic stenosis. - followed by [facility]  Sciatica, hip pain. - followed by Dr. Jean Rosenthal with [facility]  of care. - DNR/DNI  Time Spent Involved in Patient Care on Day of Examination:  25 minutes  Follow up:   Patient Instructions  Follow up in 6 months  Medication List:   Allergies as of 12/01/2021       Reactions   Statins Other (See Comments)   Muscle aches and INTERNAL  BLEEDING   Atorvastatin    Other reaction(s): Myalgias   Gabapentin Other (See Comments)   "Made me loopy"   Methocarbamol Other (See Comments)   "Made me loopy"        Medication List        Accurate as of December 01, 2021  4:56 PM. If you have any questions, ask your nurse or doctor.          albuterol 108 (90 Base) MCG/ACT inhaler Commonly known as: VENTOLIN HFA Inhale 2 puffs into the lungs every 6 (six) hours as needed for wheezing or shortness of breath.   albuterol (2.5 MG/3ML) 0.083% nebulizer solution Commonly known as: PROVENTIL Take 3 mLs (2.5 mg total) by nebulization every 6 (six) hours as needed for wheezing or shortness of breath.   amiodarone 100 MG tablet Commonly known as: PACERONE Take 2 tablets (200 mg total) by mouth daily.   apixaban 2.5 MG Tabs tablet Commonly known as: ELIQUIS Take 1 tablet (2.5 mg total) by mouth 2 (two) times daily.   budesonide 0.25 MG/2ML nebulizer solution Commonly known as: Pulmicort Take 2 mLs (0.25 mg total) by nebulization 2 (two) times daily.   carvedilol 6.25 MG tablet Commonly known as: COREG Take 1 tablet (6.25 mg total) by mouth 2 (two) times daily with a meal for 14 days.   diclofenac sodium 1 % Gel Commonly known as: VOLTAREN Apply 2.25 g topically 3 (three) times daily as needed (for pain).   ferrous sulfate 325 (65 FE) MG tablet Take 1 tablet (325 mg total) by mouth daily with breakfast.   furosemide 20 MG tablet Commonly known as: LASIX TAKE ONE TABLET BY MOUTH ONCE daily AS NEEDED FOR fluid OR EDEMA. MAY take additional DOSE IF needed   HYDROcodone-acetaminophen 7.5-325 MG tablet Commonly known as: NORCO Take 1 tablet by mouth every 6 (six) hours as needed for moderate pain.   ICAPS AREDS 2 PO Take 1 capsule by mouth 2 (two) times daily.   latanoprost 0.005 % ophthalmic solution Commonly known as: XALATAN Place 1 drop into both eyes at bedtime.   levothyroxine 125 MCG tablet Commonly known  as: SYNTHROID Take 125 mcg by mouth daily before breakfast.   losartan 100 MG tablet Commonly known as: COZAAR Take 1 tablet (100 mg total) by mouth daily. TAKE ONE TABLET BY MOUTH AT NOON Strength: 100 mg   metolazone 2.5 MG tablet Commonly known as: ZAROXOLYN Take 1 tablet (2.5 mg total) by mouth as needed (volume overload).   Nexlizet 180-10 MG Tabs Generic drug: Bempedoic Acid-Ezetimibe Take 1 capsule by mouth daily. What changed: additional instructions   pantoprazole 40 MG tablet Commonly known as: Protonix Take 1 tablet (40 mg total) by mouth daily. Take Protonix 40 mg daily after 08/20/2021 when on Eliquis. What changed: when to take this   rOPINIRole 1 MG tablet Commonly known as: REQUIP Take 1 mg by mouth See admin instructions. Take 1 mg by mouth once a day between 3 PM-4 PM and an additional 1 mg in the evening, if no relief   Vitamin D3 125 MCG (5000 UT) Caps Take 5,000 Units by mouth  at bedtime.        Signature:  Chesley Mires, MD [place] Pager - [phone] 12/01/2021, 4:56 PM

## 2021-12-02 LAB — BASIC METABOLIC PANEL
BUN/Creatinine Ratio: 20 (ref 12–28)
BUN: 32 mg/dL — ABNORMAL HIGH (ref 8–27)
CO2: 25 mmol/L (ref 20–29)
Calcium: 8.9 mg/dL (ref 8.7–10.3)
Chloride: 97 mmol/L (ref 96–106)
Creatinine, Ser: 1.62 mg/dL — ABNORMAL HIGH (ref 0.57–1.00)
Glucose: 100 mg/dL — ABNORMAL HIGH (ref 70–99)
Potassium: 4.6 mmol/L (ref 3.5–5.2)
Sodium: 140 mmol/L (ref 134–144)
eGFR: 31 mL/min/{1.73_m2} — ABNORMAL LOW (ref >59)

## 2021-12-02 LAB — BRAIN NATRIURETIC PEPTIDE: BNP: 178.4 pg/mL — ABNORMAL HIGH (ref 0.0–100.0)

## 2021-12-07 DIAGNOSIS — M722 Plantar fascial fibromatosis: Secondary | ICD-10-CM | POA: Diagnosis not present

## 2021-12-07 DIAGNOSIS — M79672 Pain in left foot: Secondary | ICD-10-CM | POA: Diagnosis not present

## 2021-12-09 DIAGNOSIS — H353114 Nonexudative age-related macular degeneration, right eye, advanced atrophic with subfoveal involvement: Secondary | ICD-10-CM | POA: Diagnosis not present

## 2021-12-14 ENCOUNTER — Other Ambulatory Visit (HOSPITAL_COMMUNITY): Payer: Medicare Other

## 2021-12-14 ENCOUNTER — Ambulatory Visit: Payer: Medicare Other | Admitting: Vascular Surgery

## 2021-12-14 DIAGNOSIS — I5032 Chronic diastolic (congestive) heart failure: Secondary | ICD-10-CM | POA: Diagnosis not present

## 2021-12-14 DIAGNOSIS — J9601 Acute respiratory failure with hypoxia: Secondary | ICD-10-CM | POA: Diagnosis not present

## 2021-12-14 DIAGNOSIS — K259 Gastric ulcer, unspecified as acute or chronic, without hemorrhage or perforation: Secondary | ICD-10-CM | POA: Diagnosis not present

## 2021-12-14 DIAGNOSIS — N1832 Chronic kidney disease, stage 3b: Secondary | ICD-10-CM | POA: Diagnosis not present

## 2021-12-14 DIAGNOSIS — J9602 Acute respiratory failure with hypercapnia: Secondary | ICD-10-CM | POA: Diagnosis not present

## 2021-12-14 DIAGNOSIS — I130 Hypertensive heart and chronic kidney disease with heart failure and stage 1 through stage 4 chronic kidney disease, or unspecified chronic kidney disease: Secondary | ICD-10-CM | POA: Diagnosis not present

## 2021-12-15 ENCOUNTER — Ambulatory Visit: Payer: Self-pay

## 2021-12-15 DIAGNOSIS — K259 Gastric ulcer, unspecified as acute or chronic, without hemorrhage or perforation: Secondary | ICD-10-CM | POA: Diagnosis not present

## 2021-12-15 DIAGNOSIS — N1832 Chronic kidney disease, stage 3b: Secondary | ICD-10-CM

## 2021-12-15 DIAGNOSIS — I10 Essential (primary) hypertension: Secondary | ICD-10-CM

## 2021-12-15 DIAGNOSIS — I509 Heart failure, unspecified: Secondary | ICD-10-CM

## 2021-12-15 DIAGNOSIS — J9601 Acute respiratory failure with hypoxia: Secondary | ICD-10-CM | POA: Diagnosis not present

## 2021-12-15 DIAGNOSIS — J9602 Acute respiratory failure with hypercapnia: Secondary | ICD-10-CM | POA: Diagnosis not present

## 2021-12-15 DIAGNOSIS — I130 Hypertensive heart and chronic kidney disease with heart failure and stage 1 through stage 4 chronic kidney disease, or unspecified chronic kidney disease: Secondary | ICD-10-CM | POA: Diagnosis not present

## 2021-12-15 DIAGNOSIS — I5032 Chronic diastolic (congestive) heart failure: Secondary | ICD-10-CM | POA: Diagnosis not present

## 2021-12-15 NOTE — Patient Outreach (Addendum)
  Care Coordination   Initial Visit Note   12/15/2021 Name: CORIANNE BUCCELLATO MRN: [id number] DOB: 1932-12-18  Melvyn Novas Schwartzkopf is a 88 y.o. year old female who sees [name], MD for primary care. I spoke with  [name] by phone today.  What matters to the patients health and wellness today?  Patient would like to learn more about her kidney disease.     Goals Addressed     Patient Stated     I want to know more about my kidney disease (pt-stated)        Care Coordination Interventions: Assessed the Patient understanding of chronic kidney disease    Evaluation of current treatment plan related to chronic kidney disease self management and patient's adherence to plan as established by provider      Reviewed prescribed diet continue to increase daily water intake to 48-64 oz daily unless otherwise directed  Assessed social determinant of health barriers    Provided education on kidney disease progression    Mailed printed educational materials related to Eating Right with Chronic Kidney disease    SDOH assessments and interventions completed:  Yes  SDOH Interventions Today    Flowsheet Row Most Recent Value  SDOH Interventions   Transportation Interventions Other (Comment)  [Community Care Guide referral sent, pt expressed a needs for help with transportation]        Care Coordination Interventions Activated:  Yes  Care Coordination Interventions:  Yes, provided   Follow up plan: Follow up call scheduled for 01/12/22 '@09'$ :30 AM     Encounter Outcome:  Pt. Visit Completed

## 2021-12-15 NOTE — Patient Instructions (Signed)
Visit Information  Thank you for taking time to visit with me today. Please don't hesitate to contact me if I can be of assistance to you.   Following are the goals we discussed today:   Goals Addressed     Patient Stated     I want to know more about my kidney disease (pt-stated)        Care Coordination Interventions: Assessed the Patient understanding of chronic kidney disease    Evaluation of current treatment plan related to chronic kidney disease self management and patient's adherence to plan as established by provider      Reviewed prescribed diet continue to increase daily water intake to 48-64 oz daily unless otherwise directed  Assessed social determinant of health barriers    Provided education on kidney disease progression    Mailed printed educational materials related to Eating Right with Chronic Kidney disease    Our next appointment is by telephone on 01/12/22 at 09:30 AM   Please call the care guide team at [phone] if you need to cancel or reschedule your appointment.   If you are experiencing a Mental Health or [place] or need someone to talk to, please call 1-800-273-TALK (toll free, 24 hour hotline)  Patient verbalizes understanding of instructions and care plan provided today and agrees to view in [place]. Active MyChart status and patient understanding of how to access instructions and care plan via MyChart confirmed with patient.     Barb Merino, RN, BSN, CCM Care Management Coordinator [facility] Care Management Direct Phone: [phone]

## 2021-12-16 ENCOUNTER — Telehealth: Payer: Self-pay

## 2021-12-16 DIAGNOSIS — H353221 Exudative age-related macular degeneration, left eye, with active choroidal neovascularization: Secondary | ICD-10-CM | POA: Diagnosis not present

## 2021-12-16 NOTE — Telephone Encounter (Signed)
   Telephone encounter was:  Unsuccessful.  12/16/2021 Name: Lisa Mcclure MRN: [id number] DOB: December 23, 1932  Unsuccessful outbound call made today to assist with:  Transportation Needs   Outreach Attempt:  1st Attempt  Patient/Daughter unable to speak at this time as patient is at an appointment. Daughter advised they would return my call later.  [place] management  [place], [place]  Main Phone: [phone]  E-mail: [email or web address]  Website: [email or web address]

## 2021-12-20 ENCOUNTER — Telehealth: Payer: Self-pay

## 2021-12-20 NOTE — Telephone Encounter (Signed)
Beth from [place] called and left a VM:  Message is as follows:  Today was the last day the will be seeing patient. Patients weight continues to be stay up, now at 209 and has seen increased swelling in her lower extremities. She has been "pretty good" for quite a while now, other than the swelling. Patient has been doing better with her diet. Just wanted to call and inform you, being that this is the last visit with patient.     Beth with [facility] [phone]

## 2021-12-20 NOTE — Telephone Encounter (Signed)
Please enroll in [facility] for CHF. Have Lauren screen her asap for the Lux Dx trial. If patient needs to come in please let this happen

## 2021-12-21 ENCOUNTER — Telehealth: Payer: Self-pay

## 2021-12-21 DIAGNOSIS — L565 Disseminated superficial actinic porokeratosis (DSAP): Secondary | ICD-10-CM | POA: Diagnosis not present

## 2021-12-21 DIAGNOSIS — L821 Other seborrheic keratosis: Secondary | ICD-10-CM | POA: Diagnosis not present

## 2021-12-21 DIAGNOSIS — D225 Melanocytic nevi of trunk: Secondary | ICD-10-CM | POA: Diagnosis not present

## 2021-12-21 NOTE — Telephone Encounter (Signed)
   Telephone encounter was:  Successful.  12/21/2021 Name: Lisa Mcclure MRN: [id number] DOB: 12-Nov-1932  Lisa Mcclure is a 88 y.o. year old female who is a primary care patient of [name], MD . The community resource team was consulted for assistance with Transportation Needs   Care guide performed the following interventions: Follow up call placed to the patient to discuss status of referral. Spoke to daughter and advised transportation information. Patient only needs transportation when daughter can not take her. CG called insurance and per [facility] patient only has ems/ambulance benefits, not any further transportation. CG has e-mailed this to daughter as well. Additional resources have been advised and those were [employer] [place], [employer], [employer], and [facility] as a last resort. Daughter has been advised the same information will be sent out in the mail as well. She has been educated that if she has not received the information in 7 to 14 days or have additional questions to please call me back at [phone]. Daughter understood. There are no further questions or concerns at this time.  Follow Up Plan:  No further follow up planned at this time. The patient has been provided with needed resources.  [facility] management  [place], [place]  Main Phone: [phone]  E-mail: [email or web address]  Website: [email or web address]

## 2021-12-22 DIAGNOSIS — M25572 Pain in left ankle and joints of left foot: Secondary | ICD-10-CM | POA: Diagnosis not present

## 2021-12-30 ENCOUNTER — Ambulatory Visit: Payer: Self-pay | Admitting: Cardiology

## 2021-12-30 ENCOUNTER — Encounter: Payer: Self-pay | Admitting: Cardiology

## 2021-12-30 VITALS — BP 141/58 | HR 65 | Temp 98.2°F | Resp 16 | Ht 63.0 in | Wt 206.0 lb

## 2021-12-30 DIAGNOSIS — I5032 Chronic diastolic (congestive) heart failure: Secondary | ICD-10-CM

## 2021-12-30 DIAGNOSIS — Z006 Encounter for examination for normal comparison and control in clinical research program: Secondary | ICD-10-CM

## 2021-12-30 DIAGNOSIS — I10 Essential (primary) hypertension: Secondary | ICD-10-CM

## 2021-12-30 DIAGNOSIS — I480 Paroxysmal atrial fibrillation: Secondary | ICD-10-CM

## 2021-12-30 NOTE — Progress Notes (Signed)
Patient with heart failure with preserved EF, would be a good candidate for HERMES-HF Trial (Ziltivekimab - a monoclonal antibody for IL-6 inhibition Vs Placebo in patients with HFpEF on morbidity and mortality in patients with Vasc Dz and CRP > 2.0).  She will also be a good candidate for loop implantation and heart failure management.  Discussed extensively with about both the trials, she appears to be interested in Lux-DX.  Patient will be screened into LUX-Dx TRENDS study (loop implantation for heart failure sensor).   Patient also enrolled in RPM for management of heart failure.    ICD-10-CM   1. Chronic diastolic (congestive) heart failure (HCC)  I50.32     2. Paroxysmal atrial fibrillation (HCC)  I48.0     3. Primary hypertension  I10     4. Research subject  Z00.6      Patient will think about these and get back to [place].  Weight loss discussed, salt restriction discussed with the patient.

## 2022-01-04 ENCOUNTER — Ambulatory Visit (INDEPENDENT_AMBULATORY_CARE_PROVIDER_SITE_OTHER): Payer: Medicare Other | Admitting: Vascular Surgery

## 2022-01-04 ENCOUNTER — Encounter: Payer: Self-pay | Admitting: Vascular Surgery

## 2022-01-04 ENCOUNTER — Ambulatory Visit (HOSPITAL_COMMUNITY)
Admission: RE | Admit: 2022-01-04 | Discharge: 2022-01-04 | Disposition: A | Discharge status: Home / Self Care | Payer: Medicare Other | Source: Ambulatory Visit | Attending: Vascular Surgery | Admitting: Vascular Surgery

## 2022-01-04 VITALS — BP 175/84 | HR 57 | Temp 97.3°F | Resp 14 | Ht 64.0 in | Wt 200.0 lb

## 2022-01-04 DIAGNOSIS — I7143 Infrarenal abdominal aortic aneurysm, without rupture: Secondary | ICD-10-CM | POA: Diagnosis not present

## 2022-01-04 DIAGNOSIS — Z9889 Other specified postprocedural states: Secondary | ICD-10-CM | POA: Diagnosis not present

## 2022-01-04 DIAGNOSIS — Z8679 Personal history of other diseases of the circulatory system: Secondary | ICD-10-CM | POA: Insufficient documentation

## 2022-01-04 NOTE — Progress Notes (Signed)
Patient name: Lisa Mcclure MRN: [id number] DOB: [birth year] Sex: female  REASON FOR VISIT:. 1 year follow-up for interval surveillance after EVAR  HPI: Lisa Mcclure is a 88 y.o. female with multiple medical problems as noted below that presents for 1 year follow-up after endovascular stent graft for treatment of a symptomatic 5.7 cm abdominal aortic aneurysm.  I initially met her in the ED when she had new onset abdominal pain that we felt was related to her enlarging AAA.  Her aneurysm was repaired on 12/20/2019 with stent graft.  She did get a CTA on 04/14/2020 that shows good seal but she does have evidence of a type II endoleak but otherwise aneurysm has shown no signs of growth and was actually decreasing in size on previous visits.  Last EVAR duplex showed that her aneurysm had decreased in size to 5.2 cm.  No new concerns today.  Past Medical History:  Diagnosis Date   Anemia    h/o hemorrhoidal bleeding and blood transfusion   Chronic diastolic CHF (congestive heart failure) (HCC)    Depression    Diverticulosis    DOE (dyspnea on exertion)    Esophageal reflux    GERD (gastroesophageal reflux disease)    Hypothyroidism    Insomnia    LBP (low back pain)    OAB (overactive bladder)    Obesity    OSA (obstructive sleep apnea)    Osteoarthritis    Paroxysmal atrial fibrillation (HCC)    Rheumatoid arthritis(714.0)    Shoulder pain, bilateral    Unspecified essential hypertension    Venous insufficiency     Past Surgical History:  Procedure Laterality Date   ABDOMINAL AORTIC ENDOVASCULAR STENT GRAFT N/A 12/20/2019   Procedure: Aortogram including catheter selection of aorta and bilateral iliac arteriogram, Endovascular repair of infrarenal abdominal aortic aneurysm with bifurcated stent graft (26 mm x 14 x 12 main body, right bell bottom with a 20 mm x 10 cm piece, and left bell bottom with a 16 mm x 12 cm piece) ;  Surgeon: Marty Heck, MD;  Location: [facility] OR;   Service: Vascular;  Laterality: N/A;   [place]   BIOPSY  06/18/2021   Procedure: BIOPSY;  Surgeon: Ronnette Juniper, MD;  Location: [facility] ENDOSCOPY;  Service: Gastroenterology;;   bladder abduction-1996  1996   breast biopsy Right 1980   CARDIOVERSION N/A 12/24/2019   Procedure: CARDIOVERSION;  Surgeon: Adrian Prows, MD;  Location: [facility] ENDOSCOPY;  Service: Cardiovascular;  Laterality: N/A;   [place]     ESOPHAGOGASTRODUODENOSCOPY (EGD) WITH PROPOFOL N/A 06/18/2021   Procedure: ESOPHAGOGASTRODUODENOSCOPY (EGD) WITH PROPOFOL;  Surgeon: Ronnette Juniper, MD;  Location: [facility] ENDOSCOPY;  Service: Gastroenterology;  Laterality: N/A;   FLEXIBLE SIGMOIDOSCOPY N/A 04/10/2015   Procedure: FLEXIBLE SIGMOIDOSCOPY;  Surgeon: Arta Silence, MD;  Location: [facility] ENDOSCOPY;  Service: Endoscopy;  Laterality: N/A;   knee arthroscopy Right 1996, 2010   KNEE ARTHROSCOPY W/ AUTOGENOUS CARTILAGE IMPLANTATION (ACI) PROCEDURE Left 1994, 1995   REFRACTIVE SURGERY  01/2020   SHOULDER SURGERY  1990   TEE WITHOUT CARDIOVERSION N/A 12/24/2019   Procedure: TRANSESOPHAGEAL ECHOCARDIOGRAM (TEE);  Surgeon: Adrian Prows, MD;  Location: [place];  Service: Cardiovascular;  Laterality: N/A;   [place] Bilateral 12/20/2019   Procedure: Ultrasound-guided access of bilateral common femoral arteries for delivery of endograft and  percutaneous closure;  Surgeon: Marty Heck, MD;  Location: [facility] OR;  Service: Vascular;  Laterality: Bilateral;   VESICOVAGINAL FISTULA CLOSURE W/ TAH     WRIST SURGERY  1967    Family History  Problem Relation Age of Onset   COPD Father    Lung cancer Mother    Heart attack Maternal Grandmother 42   Breast cancer Maternal Aunt    Lung cancer Son     SOCIAL HISTORY: Social History   Tobacco Use   Smoking status: Former    Packs/day: 0.20    Years:  10.00    Total pack years: 2.00    Types: Cigarettes    Quit date: 04/11/1984    Years since quitting: 37.7    Passive exposure: Past   Smokeless tobacco: Never  Substance Use Topics   Alcohol use: Yes    Alcohol/week: 0.0 standard drinks of alcohol    Comment: cocktail once a week    Allergies  Allergen Reactions   Statins Other (See Comments)    Muscle aches and INTERNAL BLEEDING   Atorvastatin     Other reaction(s): Myalgias   Gabapentin Other (See Comments)    "Made me loopy"   Methocarbamol Other (See Comments)    "Made me loopy"    Current Outpatient Medications  Medication Sig Dispense Refill   albuterol (PROVENTIL) (2.5 MG/3ML) 0.083% nebulizer solution Take 3 mLs (2.5 mg total) by nebulization every 6 (six) hours as needed for wheezing or shortness of breath. 75 mL 12   albuterol (VENTOLIN HFA) 108 (90 Base) MCG/ACT inhaler Inhale 2 puffs into the lungs every 6 (six) hours as needed for wheezing or shortness of breath. 8 g 2   amiodarone (PACERONE) 100 MG tablet Take 2 tablets (200 mg total) by mouth daily. 90 tablet 2   apixaban (ELIQUIS) 2.5 MG TABS tablet Take 1 tablet (2.5 mg total) by mouth 2 (two) times daily. 180 tablet 3   Bempedoic Acid-Ezetimibe (NEXLIZET) 180-10 MG TABS Take 1 capsule by mouth daily. (Patient taking differently: Take 1 capsule by mouth daily. Taking it at bedtime) 30 tablet 3   budesonide (PULMICORT) 0.25 MG/2ML nebulizer solution Take 2 mLs (0.25 mg total) by nebulization 2 (two) times daily. 60 mL 12   busPIRone (BUSPAR) 7.5 MG tablet Take 7.5 mg by mouth 2 (two) times daily.     Cholecalciferol (VITAMIN D3) 125 MCG (5000 UT) CAPS Take 5,000 Units by mouth at bedtime.     diclofenac sodium (VOLTAREN) 1 % GEL Apply 2.25 g topically 3 (three) times daily as needed (for pain).      furosemide (LASIX) 20 MG tablet TAKE ONE TABLET BY MOUTH ONCE daily AS NEEDED FOR fluid OR EDEMA. MAY take additional DOSE IF needed 90 tablet 3    HYDROcodone-acetaminophen (NORCO) 7.5-325 MG tablet Take 1 tablet by mouth every 6 (six) hours as needed for moderate pain. 20 tablet 0   latanoprost (XALATAN) 0.005 % ophthalmic solution Place 1 drop into both eyes at bedtime.     levothyroxine (SYNTHROID) 125 MCG tablet Take 125 mcg by mouth daily before breakfast.      losartan (COZAAR) 100 MG tablet Take 1 tablet (100 mg total) by mouth daily. TAKE ONE TABLET BY MOUTH AT NOON Strength: 100 mg 90 tablet 3   Multiple Vitamins-Minerals (ICAPS AREDS 2 PO) Take 1 capsule by mouth 2 (two) times daily.     rOPINIRole (REQUIP) 1 MG tablet Take 1 mg by mouth See admin  instructions. Take 1 mg by mouth once a day between 3 PM-4 PM and an additional 1 mg in the evening, if no relief  12   carvedilol (COREG) 6.25 MG tablet Take 1 tablet (6.25 mg total) by mouth 2 (two) times daily with a meal for 14 days. 28 tablet 0   ferrous sulfate 325 (65 FE) MG tablet Take 1 tablet (325 mg total) by mouth daily with breakfast. 90 tablet 0   metolazone (ZAROXOLYN) 2.5 MG tablet Take 1 tablet (2.5 mg total) by mouth as needed (volume overload). 30 tablet 0   pantoprazole (PROTONIX) 40 MG tablet Take 1 tablet (40 mg total) by mouth daily. Take Protonix 40 mg daily after 08/20/2021 when on Eliquis. (Patient taking differently: Take 40 mg by mouth 2 (two) times daily. Take Protonix 40 mg daily after 08/20/2021 when on Eliquis.) 90 tablet 0   No current facility-administered medications for this visit.    REVIEW OF SYSTEMS:  [X]  denotes positive finding, [ ]  denotes negative finding Cardiac  Comments:  Chest pain or chest pressure:    Shortness of breath upon exertion:    Short of breath when lying flat:    Irregular heart rhythm:        Vascular    Pain in calf, thigh, or hip brought on by ambulation:    Pain in feet at night that wakes you up from your sleep:     Blood clot in your veins:    Leg swelling:         Pulmonary    Oxygen at home:    Productive cough:      Wheezing:         Neurologic    Sudden weakness in arms or legs:     Sudden numbness in arms or legs:     Sudden onset of difficulty speaking or slurred speech:    Temporary loss of vision in one eye:     Problems with dizziness:         Gastrointestinal    Blood in stool:     Vomited blood:         Genitourinary    Burning when urinating:     Blood in urine:        Psychiatric    Major depression:         Hematologic    Bleeding problems:    Problems with blood clotting too easily:        Skin    Rashes or ulcers:        Constitutional    Fever or chills:      PHYSICAL EXAM: Vitals:   01/04/22 1029  BP: (!) 175/84  Pulse: (!) 57  Resp: 14  Temp: (!) 97.3 F (36.3 C)  TempSrc: Temporal  SpO2: 94%  Weight: 200 lb (90.7 kg)  Height: 5' 4"  (1.626 m)    GENERAL: The patient is a well-nourished female, in no acute distress. The vital signs are documented above. CARDIAC: There is a regular rate and rhythm.  VASCULAR:  Palpable femoral pulses bilaterally Palpable PT pulses bilaterally ABDOMEN: soft, NT, ND.  DATA:   EVAR duplex today shows no endoleak with aortic diameter 5.7 x 4.6 cm  Assessment/Plan:  88 year old female presents for 1 year interval follow-up and ongoing surveillance after endovascular stent graft of her infrarenal abdominal aorta for a 5.7 cm symptomatic AAA on 12/20/2019.  Discussed her duplex today shows no evidence of endoleak.  She has good  femoral pulses bilaterally.  Her duplex today does question whether her aneurysm could have increased in size given a maximal measurement of 5.7 cm compared to 5.2 cm 1 year ago.  I will bring her back in 6 months with another EVAR duplex.  If there is any evidence of continued growth we will plan for CT scan.    Marty Heck, MD [facility] Office: [phone]

## 2022-01-05 ENCOUNTER — Other Ambulatory Visit: Payer: Self-pay

## 2022-01-05 DIAGNOSIS — Z8679 Personal history of other diseases of the circulatory system: Secondary | ICD-10-CM

## 2022-01-06 ENCOUNTER — Ambulatory Visit: Payer: Medicare Other | Admitting: Cardiology

## 2022-01-06 ENCOUNTER — Encounter: Payer: Self-pay | Admitting: Cardiology

## 2022-01-06 VITALS — BP 138/53 | HR 65 | Temp 98.3°F | Resp 16 | Ht 64.0 in | Wt 212.0 lb

## 2022-01-06 DIAGNOSIS — I350 Nonrheumatic aortic (valve) stenosis: Secondary | ICD-10-CM | POA: Diagnosis not present

## 2022-01-06 DIAGNOSIS — I5032 Chronic diastolic (congestive) heart failure: Secondary | ICD-10-CM

## 2022-01-06 DIAGNOSIS — I10 Essential (primary) hypertension: Secondary | ICD-10-CM | POA: Diagnosis not present

## 2022-01-06 DIAGNOSIS — I480 Paroxysmal atrial fibrillation: Secondary | ICD-10-CM | POA: Diagnosis not present

## 2022-01-06 DIAGNOSIS — R32 Unspecified urinary incontinence: Secondary | ICD-10-CM | POA: Diagnosis not present

## 2022-01-06 MED ORDER — DAPAGLIFLOZIN PROPANEDIOL 10 MG PO TABS: 10.0000 mg | ORAL_TABLET | Freq: Every day | ORAL | 6 refills | Status: DC

## 2022-01-06 NOTE — Telephone Encounter (Signed)
Patient was seen today.

## 2022-01-06 NOTE — Progress Notes (Signed)
Primary Physician/Referring:  [name], MD  [name], female    DOB: February 12, 1933, 88 y.o.   MRN: [id number]  Chief Complaint  Patient presents with   Chronic diastolic heart failure    Follow-up   HPI:    JANIECE Mcclure  is a 88 y.o.  female  with past medical history of obesity, chronic diastolic CHF, hypertensive heart disease and chronic 3A-B kidney disease, paroxysmal atrial fibrillation, last documented episode August 2021, anemia, hemorrhoids, diverticulosis, esophageal reflux, depression, OSA intolerant to CPAP on nocturnal 02, rheumatoid arthritis, and venous insufficiency, aortic and coronary calcification, s/p AAA repair on 12/20/2019 being followed by vascular surgery.    Patient presents to the office for follow-up of heart failure, states that in spite of doing her best.  She continues to have weight gain and dyspnea on exertion.  She continues to have leg edema as well.  No PND or orthopnea.  She feels extremely fatigued and tired.  On further questioning, she does have obstructive sleep apnea but has not been able to use CPAP but has been on nocturnal oxygen which she has not been using on a regular basis as well.  She has been compliant with medications.  States that she is tired of taking furosemide as she has incontinence and has debilitated her socially.  She is still fairly active for her age, likes to go out and mingle with friends and also church group.  She lives independently.  Tries to watch out for her diet.  Past Medical History:  Diagnosis Date   Anemia    h/o hemorrhoidal bleeding and blood transfusion   Chronic diastolic CHF (congestive heart failure) (HCC)    Depression    Diverticulosis    DOE (dyspnea on exertion)    Esophageal reflux    GERD (gastroesophageal reflux disease)    Hypothyroidism    Insomnia    LBP (low back pain)    OAB (overactive bladder)    Obesity    OSA (obstructive sleep apnea)    Osteoarthritis     Paroxysmal atrial fibrillation (HCC)    Rheumatoid arthritis(714.0)    Shoulder pain, bilateral    Unspecified essential hypertension    Venous insufficiency    Past Surgical History:  Procedure Laterality Date   ABDOMINAL AORTIC ENDOVASCULAR STENT GRAFT Lisa Mcclure 12/20/2019   Procedure: Aortogram including catheter selection of aorta and bilateral iliac arteriogram, Endovascular repair of infrarenal abdominal aortic aneurysm with bifurcated stent graft (26 mm x 14 x 12 main body, right bell bottom with a 20 mm x 10 cm piece, and left bell bottom with a 16 mm x 12 cm piece) ;  Surgeon: Marty Heck, MD;  Location: [facility] OR;  Service: Vascular;  Laterality: Lisa Mcclure;   [place]   BIOPSY  06/18/2021   Procedure: BIOPSY;  Surgeon: Ronnette Juniper, MD;  Location: [facility] ENDOSCOPY;  Service: Gastroenterology;;   bladder abduction-1996  1996   breast biopsy Right 1980   CARDIOVERSION Lisa Mcclure 12/24/2019   Procedure: CARDIOVERSION;  Surgeon: Adrian Prows, MD;  Location: [facility] ENDOSCOPY;  Service: Cardiovascular;  Laterality: Lisa Mcclure;   [place]     ESOPHAGOGASTRODUODENOSCOPY (EGD) WITH PROPOFOL Lisa Mcclure 06/18/2021   Procedure: ESOPHAGOGASTRODUODENOSCOPY (EGD) WITH PROPOFOL;  Surgeon: Ronnette Juniper, MD;  Location: [facility] ENDOSCOPY;  Service: Gastroenterology;  Laterality: Lisa Mcclure;   FLEXIBLE SIGMOIDOSCOPY Lisa Mcclure 04/10/2015   Procedure:  FLEXIBLE SIGMOIDOSCOPY;  Surgeon: Arta Silence, MD;  Location: [facility] ENDOSCOPY;  Service: Endoscopy;  Laterality: Lisa Mcclure;   knee arthroscopy Right 1996, 2010   KNEE ARTHROSCOPY W/ AUTOGENOUS CARTILAGE IMPLANTATION (ACI) PROCEDURE Left 1994, 1995   REFRACTIVE SURGERY  01/2020   SHOULDER SURGERY  1990   TEE WITHOUT CARDIOVERSION Lisa Mcclure 12/24/2019   Procedure: TRANSESOPHAGEAL ECHOCARDIOGRAM (TEE);  Surgeon: Adrian Prows, MD;  Location: [facility] ENDOSCOPY;  Service: Cardiovascular;  Laterality: Lisa Mcclure;   [place] Bilateral 12/20/2019   Procedure: Ultrasound-guided access of bilateral common femoral arteries for delivery of endograft and percutaneous closure;  Surgeon: Marty Heck, MD;  Location: [facility] OR;  Service: Vascular;  Laterality: Bilateral;   VESICOVAGINAL FISTULA CLOSURE W/ TAH     WRIST SURGERY  1967   Family History  Problem Relation Age of Onset   COPD Father    Lung cancer Mother    Heart attack Maternal Grandmother 22   Breast cancer Maternal Aunt    Lung cancer Son     Social History   Tobacco Use   Smoking status: Former    Packs/day: 0.20    Years: 10.00    Total pack years: 2.00    Types: Cigarettes    Quit date: 04/11/1984    Years since quitting: 37.7    Passive exposure: Past   Smokeless tobacco: Never  Substance Use Topics   Alcohol use: Yes    Alcohol/week: 0.0 standard drinks of alcohol    Comment: cocktail once a week   Marital Status: Widowed  ROS  Review of Systems  Cardiovascular:  Positive for dyspnea on exertion and leg swelling. Negative for chest pain, orthopnea, palpitations, paroxysmal nocturnal dyspnea and syncope.  Genitourinary:  Positive for bladder incontinence and urgency.   Objective  Blood pressure (!) 138/53, pulse 65, temperature 98.3 F (36.8 C), temperature source Temporal, resp. rate 16, height 5' 4"  (1.626 m), weight 212 lb (96.2 kg), SpO2 92 %.     01/06/2022   12:54 PM 01/06/2022   12:51 PM 01/04/2022   10:29 AM  Vitals with BMI  Height  5' 4"  5' 4"   Weight  212 lbs 200 lbs  BMI  28.97 91.50  Systolic 413 643 837  Diastolic 53 77 84  Pulse 65 69 57     Physical Exam Vitals reviewed.  Constitutional:      Appearance: She is well-developed.  Neck:     Vascular: Carotid bruit present. No JVD.  Cardiovascular:     Rate and Rhythm: Normal rate and regular rhythm.     Pulses: Intact distal pulses.     Heart sounds: S1 normal and S2 normal. Murmur heard.     Harsh midsystolic murmur  is present with a grade of 3/6 at the upper right sternal border radiating to the apex.     No gallop.     Comments: No JVD.  Pulmonary:     Effort: Pulmonary effort is normal. No accessory muscle usage or respiratory distress.     Breath sounds: No wheezing or rhonchi.  Abdominal:     General: Bowel sounds are normal.     Palpations: Abdomen is soft.  Musculoskeletal:     Right lower leg: Edema (minimal) present.     Left lower leg: Edema (minimal) present.    Laboratory examination:   Recent Labs    06/15/21 1712 06/16/21 0644 06/20/21 0536 12/01/21 1518  NA 137 136 138  140  K 4.2 4.0 4.3 4.6  CL 93* 95* 93* 97  CO2 33* 32 37* 25  GLUCOSE 98 240* 100* 100*  BUN 52* 50* 36* 32*  CREATININE 2.00* 1.59* 1.28* 1.62*  CALCIUM 8.7* 8.7* 8.5* 8.9  GFRNONAA 24* 31* 41*  --     CrCl cannot be calculated (Patient's most recent lab result is older than the maximum 21 days allowed.).     Latest Ref Rng & Units 12/01/2021    3:18 PM 06/20/2021    5:36 AM 06/16/2021    6:44 AM  CMP  Glucose 70 - 99 mg/dL 100  100  240   BUN 8 - 27 mg/dL 32  36  50   Creatinine 0.57 - 1.00 mg/dL 1.62  1.28  1.59   Sodium 134 - 144 mmol/L 140  138  136   Potassium 3.5 - 5.2 mmol/L 4.6  4.3  4.0   Chloride 96 - 106 mmol/L 97  93  95   CO2 20 - 29 mmol/L 25  37  32   Calcium 8.7 - 10.3 mg/dL 8.9  8.5  8.7       Latest Ref Rng & Units 06/21/2021    4:47 AM 06/20/2021    5:24 AM 06/18/2021    4:38 AM  CBC  WBC 4.0 - 10.5 K/uL 7.6  7.4  8.7   Hemoglobin 12.0 - 15.0 g/dL 8.7  8.7  8.5   Hematocrit 36.0 - 46.0 % 29.4  29.6  28.0   Platelets 150 - 400 K/uL 376  377  367    Lipid Panel    Component Value Date/Time   CHOL 126 03/14/2019 1121   TRIG 82 03/14/2019 1121   HDL 49 03/14/2019 1121   CHOLHDL 5.1 CALC 12/19/2006 0916   VLDL 26 12/19/2006 0916   LDLCALC 61 03/14/2019 1121   LDLDIRECT 159.2 12/19/2006 0916   HEMOGLOBIN A1C No results found for: "HGBA1C", "MPG" TSH No results for  input(s): "TSH" in the last 8760 hours.  External labs:  02/22/2021: BUN 36, creatinine 1.3, GFR 39, sodium 130, potassium 5.4 Hgb 11.3, HCT 35.1, platelet 336 Magnesium 2.4  07/20/2020: Glucose 84, BUN 30, creatinine 1.22, GFR 48, sodium 137, potassium 4.9,  BNP 321  Cholesterol, total 164.000 m 06/21/2019 HDL 46 MG/DL 06/21/2019 LDL 99.000 mg 06/21/2019 Triglycerides 93.000 06/21/2019  04/14/2020: Serum glucose 86 mg, BUN 22, creatinine 1.13, EGFR 44 mL, sodium 138, potassium 4.3.  CMP otherwise normal. Hb 10.2/HCT 31.5, platelets 383.  02/17/2020:  Hemoglobin 11.1, hematocrit 34.2, platelets 383 BUN 26, creatinine 0.9, EGFR 59, sodium 136, potassium 4.6  01/07/2020: BUN 29, GFR 57, creatinine 0.93, BUN/CR 31, sodium 133, potassium 4.6, chloride 93, BMP otherwise normal  Cholesterol, total 164.000 m 06/21/2019 Triglycerides 93.000 06/21/2019 HDL 46 MG/DL 06/21/2019 LDL 99.000 mg 06/21/2019  Glucose Random 87.000 mg 06/21/2019 BUN 25.000 mg 06/21/2019 Creatinine, Serum 0.800 mg/ 06/21/2019  TSH 3.220 micr 06/21/2019  AST 18, ALT 11 09/14/2018  FOBT: Normal 04/24/2018  MicroAlbumin Urine 13.000 03/23/2017 MicroAlbumin/Creat 37.9 MG/ 03/23/2017  Allergies   Allergies  Allergen Reactions   Statins Other (See Comments)    Muscle aches and INTERNAL BLEEDING   Atorvastatin     Other reaction(s): Myalgias   Gabapentin Other (See Comments)    "Made me loopy"   Methocarbamol Other (See Comments)    "Made me loopy"    Medications Prior to Visit:   Outpatient Medications Prior to Visit  Medication Sig Dispense  Refill   albuterol (PROVENTIL) (2.5 MG/3ML) 0.083% nebulizer solution Take 3 mLs (2.5 mg total) by nebulization every 6 (six) hours as needed for wheezing or shortness of breath. 75 mL 12   albuterol (VENTOLIN HFA) 108 (90 Base) MCG/ACT inhaler Inhale 2 puffs into the lungs every 6 (six) hours as needed for wheezing or shortness of breath. 8 g 2   amiodarone (PACERONE)  100 MG tablet Take 2 tablets (200 mg total) by mouth daily. 90 tablet 2   apixaban (ELIQUIS) 2.5 MG TABS tablet Take 1 tablet (2.5 mg total) by mouth 2 (two) times daily. 180 tablet 3   Bempedoic Acid-Ezetimibe (NEXLIZET) 180-10 MG TABS Take 1 capsule by mouth daily. (Patient taking differently: Take 1 capsule by mouth daily. Taking it at bedtime) 30 tablet 3   budesonide (PULMICORT) 0.25 MG/2ML nebulizer solution Take 2 mLs (0.25 mg total) by nebulization 2 (two) times daily. 60 mL 12   busPIRone (BUSPAR) 7.5 MG tablet Take 7.5 mg by mouth 2 (two) times daily.     carvedilol (COREG) 6.25 MG tablet Take 1 tablet (6.25 mg total) by mouth 2 (two) times daily with a meal for 14 days. 28 tablet 0   Cholecalciferol (VITAMIN D3) 125 MCG (5000 UT) CAPS Take 5,000 Units by mouth at bedtime.     diclofenac sodium (VOLTAREN) 1 % GEL Apply 2.25 g topically 3 (three) times daily as needed (for pain).      ferrous sulfate 325 (65 FE) MG tablet Take 1 tablet (325 mg total) by mouth daily with breakfast. 90 tablet 0   HYDROcodone-acetaminophen (NORCO) 7.5-325 MG tablet Take 1 tablet by mouth every 6 (six) hours as needed for moderate pain. 20 tablet 0   latanoprost (XALATAN) 0.005 % ophthalmic solution Place 1 drop into both eyes at bedtime.     levothyroxine (SYNTHROID) 125 MCG tablet Take 125 mcg by mouth daily before breakfast.      losartan (COZAAR) 100 MG tablet Take 1 tablet (100 mg total) by mouth daily. TAKE ONE TABLET BY MOUTH AT NOON Strength: 100 mg 90 tablet 3   metolazone (ZAROXOLYN) 2.5 MG tablet Take 1 tablet (2.5 mg total) by mouth as needed (volume overload). 30 tablet 0   Multiple Vitamins-Minerals (ICAPS AREDS 2 PO) Take 1 capsule by mouth 2 (two) times daily.     pantoprazole (PROTONIX) 40 MG tablet Take 1 tablet (40 mg total) by mouth daily. Take Protonix 40 mg daily after 08/20/2021 when on Eliquis. (Patient taking differently: Take 40 mg by mouth 2 (two) times daily. Take Protonix 40 mg daily  after 08/20/2021 when on Eliquis.) 90 tablet 0   rOPINIRole (REQUIP) 1 MG tablet Take 1 mg by mouth See admin instructions. Take 1 mg by mouth once a day between 3 PM-4 PM and an additional 1 mg in the evening, if no relief  12   furosemide (LASIX) 20 MG tablet TAKE ONE TABLET BY MOUTH ONCE daily AS NEEDED FOR fluid OR EDEMA. MAY take additional DOSE IF needed 90 tablet 3   furosemide (LASIX) 20 MG tablet Take 1 tablet (20 mg total) by mouth daily as needed for fluid. 90 tablet 3   No facility-administered medications prior to visit.   Final Medications at End of Visit    Current Meds  Medication Sig   albuterol (PROVENTIL) (2.5 MG/3ML) 0.083% nebulizer solution Take 3 mLs (2.5 mg total) by nebulization every 6 (six) hours as needed for wheezing or shortness of breath.  albuterol (VENTOLIN HFA) 108 (90 Base) MCG/ACT inhaler Inhale 2 puffs into the lungs every 6 (six) hours as needed for wheezing or shortness of breath.   amiodarone (PACERONE) 100 MG tablet Take 2 tablets (200 mg total) by mouth daily.   apixaban (ELIQUIS) 2.5 MG TABS tablet Take 1 tablet (2.5 mg total) by mouth 2 (two) times daily.   Bempedoic Acid-Ezetimibe (NEXLIZET) 180-10 MG TABS Take 1 capsule by mouth daily. (Patient taking differently: Take 1 capsule by mouth daily. Taking it at bedtime)   budesonide (PULMICORT) 0.25 MG/2ML nebulizer solution Take 2 mLs (0.25 mg total) by nebulization 2 (two) times daily.   busPIRone (BUSPAR) 7.5 MG tablet Take 7.5 mg by mouth 2 (two) times daily.   carvedilol (COREG) 6.25 MG tablet Take 1 tablet (6.25 mg total) by mouth 2 (two) times daily with a meal for 14 days.   Cholecalciferol (VITAMIN D3) 125 MCG (5000 UT) CAPS Take 5,000 Units by mouth at bedtime.   dapagliflozin propanediol (FARXIGA) 10 MG TABS tablet Take 1 tablet (10 mg total) by mouth daily before breakfast.   diclofenac sodium (VOLTAREN) 1 % GEL Apply 2.25 g topically 3 (three) times daily as needed (for pain).    ferrous  sulfate 325 (65 FE) MG tablet Take 1 tablet (325 mg total) by mouth daily with breakfast.   HYDROcodone-acetaminophen (NORCO) 7.5-325 MG tablet Take 1 tablet by mouth every 6 (six) hours as needed for moderate pain.   latanoprost (XALATAN) 0.005 % ophthalmic solution Place 1 drop into both eyes at bedtime.   levothyroxine (SYNTHROID) 125 MCG tablet Take 125 mcg by mouth daily before breakfast.    losartan (COZAAR) 100 MG tablet Take 1 tablet (100 mg total) by mouth daily. TAKE ONE TABLET BY MOUTH AT NOON Strength: 100 mg   metolazone (ZAROXOLYN) 2.5 MG tablet Take 1 tablet (2.5 mg total) by mouth as needed (volume overload).   Multiple Vitamins-Minerals (ICAPS AREDS 2 PO) Take 1 capsule by mouth 2 (two) times daily.   pantoprazole (PROTONIX) 40 MG tablet Take 1 tablet (40 mg total) by mouth daily. Take Protonix 40 mg daily after 08/20/2021 when on Eliquis. (Patient taking differently: Take 40 mg by mouth 2 (two) times daily. Take Protonix 40 mg daily after 08/20/2021 when on Eliquis.)   rOPINIRole (REQUIP) 1 MG tablet Take 1 mg by mouth See admin instructions. Take 1 mg by mouth once a day between 3 PM-4 PM and an additional 1 mg in the evening, if no relief   [DISCONTINUED] furosemide (LASIX) 20 MG tablet TAKE ONE TABLET BY MOUTH ONCE daily AS NEEDED FOR fluid OR EDEMA. MAY take additional DOSE IF needed    Radiology:   CT Abdomen and Pelvis W Contrast 12/20/2019: 1. Large Infrarenal Abdominal Aortic Aneurysm measuring up to 5.7 cm with mild perianeurysmal inflammation. Differential considerations include impending AAA rupture, mycotic AAA, symptomatic AAA. Recommend urgent transfer to emergency department and Vascular Surgery consultation. Aortic aneurysm NOS (ICD10-I71.9). Aortic Atherosclerosis (ICD10-I70.0). 2. No other acute or inflammatory process identified. 5 cm fat containing left pelvic hernia. Large bowel diverticulosis. 3. Simple appearing 4 cm left ovarian cyst, recommend follow-up  Ultrasound in 6-12 months  Chest X-ray 12/20/2019:  Diffuse cardiac enlargement. Emphysematous changes in the lungs. Coarse interstitial pattern to the lungs could represent fibrosis or edema. No focal consolidation. No pleural effusions. No pneumothorax. Calcified and tortuous aorta. Degenerative changes in the shoulders and spine. Old rib fractures. Similar appearance to previous study. IMPRESSION: 1. Cardiac enlargement.  2. Coarse interstitial pattern to the lungs could represent fibrosis or edema.  Abdominal X-Ray 12/20/2019:  Interval placement of aorto iliac stent graft. Nonobstructive bowel gas pattern.  Cardiac Studies:   Lexiscan myoview stress test 12/08/2017: 1. Lexiscan stress test was performed. Exercise capacity was not assessed. Stress symptoms included abdominal pain. Blood pressure was normal. The resting and stress electrocardiogram demonstrated normal sinus rhythm, normal resting conduction, no resting arrhythmias and normal rest repolarization. 2. The overall quality of the study is good. There is no evidence of abnormal lung activity. Stress and rest SPECT images demonstrate homogeneous tracer distribution throughout the myocardium. Gated SPECT imaging reveals normal myocardial thickening and wall motion. The left ventricular ejection fraction was normal (54%). 3. Low risk study  Ambulatory cardiac telemetry 14 days 03/22/2021 - 04/04/2021: Predominant underlying rhythm was sinus with first-degree AV block.  Patient with 19 brief episodes of SVT which were asymptomatic.  Frequent PVCs with 20.4% burden and episodes of ventricular bigeminy and trigeminy which correlated with patient triggered events.  No evidence of atrial fibrillation, ventricular tachycardia, pauses >3 seconds, or high degree AV block.  Echocardiogram 04/20/2021:  Normal LV systolic function with visual EF 55-60%. Left ventricle cavity  is normal in size. Moderate left ventricular hypertrophy. Normal  global  wall motion. Indeterminate diastolic filling pattern.  Left atrial cavity is severely dilated.  Moderate aortic valve stenosis (peak velocity 3.62ms, MG 23.089mG, AVA  (VTI) measures 1.3 cm^2, DI 0.4).  Mild (Grade I) mitral regurgitation.  Mild tricuspid regurgitation. Mild pulmonary hypertension. RVSP measures  42 mmHg.  IVC is dilated with a respiratory response of >50%.  Compared to study 05/27/2020 LVEF remains preserved, G2DD is now  indeterminate, mild LAE is now severe, mild AS is now moderate, mild PHTN  is new finding (RVSP 427m).   EKG    08/26/2021: Normal sinus rhythm at a rate of 62 bpm.    Assessment     ICD-10-CM   1. Chronic diastolic (congestive) heart failure (HCC)  I50.32 furosemide (LASIX) 20 MG tablet    dapagliflozin propanediol (FARXIGA) 10 MG TABS tablet    Basic metabolic panel    Pro b natriuretic peptide (BNP)    2. Primary hypertension  I10     3. Paroxysmal atrial fibrillation (HCC)  I48.0     4. Incontinence in female  R32 Ambulatory referral to Urogynecology     This patients CHA2DS2-VASc Score 6 (CHF, HTN, vasc, age, F) and yearly risk of stroke 9.8%.  Meds ordered this encounter  Medications   dapagliflozin propanediol (FARXIGA) 10 MG TABS tablet    Sig: Take 1 tablet (10 mg total) by mouth daily before breakfast.    Dispense:  30 tablet    Refill:  6     Medications Discontinued During This Encounter  Medication Reason   furosemide (LASIX) 20 MG tablet      Recommendations:   CarDEBERAH ADOLFs a 88 8o.  female  with past medical history of obesity, chronic diastolic CHF, hypertensive heart disease and chronic 3A-B kidney disease, paroxysmal atrial fibrillation, last documented episode August 2021, anemia, hemorrhoids, diverticulosis, esophageal reflux, depression, OSA intolerant to CPAP on nocturnal 02, rheumatoid arthritis, and venous insufficiency, aortic and coronary calcification, s/p AAA repair on 12/20/2019 being  followed by vascular surgery.    Last GI bleed was in March 2023 secondary to gastric ulcer requiring blood transfusion.  Fortunately she has not had any further episodes of GI bleed.  She is  tolerating anticoagulation with Eliquis.  There was no culprit medication or NSAIDs involved.  She has had GI bleed in 2017 requiring blood transfusion as well  Patient could be high risk for recurrent GI bleed.  Although she is tolerating Eliquis for the last 6 months, I would like her to be evaluated for Watchman device.  With regard to chronic diastolic heart failure, I would like her to use Lasix only on a as needed basis, I would like to add [place] and see how she responds at 10 mg daily.  We will do medication assistance. She takes Metolazone only rarely if BID dosing of lasix does not work.  She is also not using oxygen at night on a regular basis, advised her to use this as it can precipitate heart failure as well.  We will check echocardiogram for heart failure and also for follow-up of aortic stenosis.  Would like to see her back in 6 weeks for follow-up.  This was a 40-minute office visit encounter. Patient enrolled in RPM in our office that also initiated this visit due to worsening dyspnea and weight gain.  [facility] Referral for Left Atrial Appendage Closure with Non-Valvular Atrial Fibrillation   KARN DERK is a 88 y.o. female is being referred to the [facility] Team for evaluation for Left Atrial Appendage Closure with Watchman device for the management of stroke risk resulting form non-valvular atrial fibrillation.    Base upon Ms. Melichar's history, she is felt to be a poor candidate for long-term anticoagulation because of a history of bleeding (e.g. intracerebral, subdural, GI, retro-peritoneal).  The patient has a HAS-BLED score of   indicating a Yearly Major Bleeding Risk of  8.9 %.  This includes age, prior GI bleed, renal insufficiency, hypertension.  Her  CHADS2-VASc Score is   with an unadjusted Ischemic Stroke Rate (% per year) of 10  %.    Her stroke risk necessitates a strategy of stroke prevention with either long-term oral anticoagulation or left atrial appendage occlusion therapy. We have discussed their bleeding risk in the context of their comorbid medical problems, as well as the rationale for referral for evaluation of Watchman left atrial appendage occlusion therapy. While the patient is at high long-term bleeding risk, they may be appropriate for short-term anticoagulation. Based on this individual patient's stroke and bleeding risk, a shared decision has been made to refer the patient for consideration of Watchman left atrial appendage closure utilizing the [employer] of Cardiology shared decision tool.  Patient admitted 06/15/21-06/21/21 with acute respiratory failure. Evaluation revealed anemia requiring transfusion, likely secondary to gastric ulcer.  Patient admitted 06/15/21-06/21/21 with acute respiratory failure. Evaluation revealed anemia requiring transfusion, likely secondary to gastric ulcer. Patient was last seen in the office 08/26/2021 with worsening leg edema and shortness of breath. At that time added PRN metolazone. She now presents for follow up. Patients dyspnea and leg swelling have improved. Will continue present diuretic regimen. Patient will have repeat BMP done with PCP. Blood pressure is mildly elevated in the office today, but is typically well controlled at home. Will not make changes at this time.   Follow up in 3 months, sooner if needed.    Adrian Prows, PA-C 01/06/2022, 2:10 PM Office: [phone]

## 2022-01-12 ENCOUNTER — Ambulatory Visit: Payer: Self-pay

## 2022-01-12 ENCOUNTER — Other Ambulatory Visit: Payer: Self-pay | Admitting: Cardiology

## 2022-01-12 DIAGNOSIS — I493 Ventricular premature depolarization: Secondary | ICD-10-CM

## 2022-01-12 DIAGNOSIS — I480 Paroxysmal atrial fibrillation: Secondary | ICD-10-CM

## 2022-01-12 NOTE — Telephone Encounter (Signed)
Documentation for verbal prescription to [facility] today.

## 2022-01-12 NOTE — Patient Outreach (Addendum)
  Care Coordination   Follow Up Visit Note   01/12/2022 Name: Lisa Mcclure MRN: [id number] DOB: 1932-12-05  Lisa Mcclure is a 87 y.o. year old female who sees [name], MD for primary care. I spoke with  [name] by phone today.  What matters to the patients health and wellness today?  Patient recently started a new medication for her HF.     Goals Addressed               This Visit's Progress     Patient Stated     I have started a new medication for my heart failure (pt-stated)        Care Coordination Interventions: Reviewed Heart Failure Action Plan in depth and provided written copy Assessed need for readable accurate scales in home Discussed importance of daily weight and advised patient to weigh and record daily Reviewed role of diuretics in prevention of fluid overload and management of heart failure; Discussed the importance of keeping all appointments with provider Review of patient status, including review of consultant's reports, relevant laboratory and other test results, and medications completed Educated patient re: indication, usage and dosing of Farxiga, educated on risk for dehydration and or UTI, reiterated importance to stay well hydrated and report new symptoms or concerns to Cardiology promptly Mailed printed HF Action Plan to patient's home address        I want to know more about my kidney disease (pt-stated)        Care Coordination Interventions: Assessed the Patient understanding of chronic kidney disease    Evaluation of current treatment plan related to chronic kidney disease self management and patient's adherence to plan as established by provider Review of patient status, including review of consultant's reports, relevant laboratory and other test results, and medications completed Educated patient regarding disease progression Reviewed and discussed the importance to stay well hydrated aiming for 48-64 oz of water  daily unless otherwise directed                SDOH assessments and interventions completed:  No     Care Coordination Interventions Activated:  Yes  Care Coordination Interventions:  Yes, provided   Follow up plan: Follow up call scheduled for 03/09/22 '@12'$  PM     Encounter Outcome:  Pt. Visit Completed

## 2022-01-12 NOTE — Patient Instructions (Addendum)
Visit Information  Thank you for taking time to visit with me today. Please don't hesitate to contact me if I can be of assistance to you.   Following are the goals we discussed today:   Goals Addressed               This Visit's Progress     Patient Stated     I have started a new medication for my heart failure (pt-stated)        Care Coordination Interventions: Reviewed Heart Failure Action Plan in depth and provided written copy Assessed need for readable accurate scales in home Discussed importance of daily weight and advised patient to weigh and record daily Reviewed role of diuretics in prevention of fluid overload and management of heart failure; Discussed the importance of keeping all appointments with provider Review of patient status, including review of consultant's reports, relevant laboratory and other test results, and medications completed Educated patient re: indication, usage and dosing of Farxiga, educated on risk for dehydration and or UTI, reiterated importance to stay well hydrated and report new symptoms or concerns to Cardiology promptly Mailed printed HF Action Plan to patient's home address        I want to know more about my kidney disease (pt-stated)        Care Coordination Interventions: Assessed the Patient understanding of chronic kidney disease    Evaluation of current treatment plan related to chronic kidney disease self management and patient's adherence to plan as established by provider Review of patient status, including review of consultant's reports, relevant laboratory and other test results, and medications completed Educated patient regarding disease progression Reviewed and discussed the importance to stay well hydrated aiming for 48-64 oz of water daily unless otherwise directed                Our next appointment is by telephone on 03/09/22 at 12 PM  Please call the care guide team at [phone] if you need to cancel or  reschedule your appointment.   If you are experiencing a Mental Health or [place] or need someone to talk to, please call 1-800-273-TALK (toll free, 24 hour hotline)  Patient verbalizes understanding of instructions and care plan provided today and agrees to view in [place]. Active MyChart status and patient understanding of how to access instructions and care plan via MyChart confirmed with patient.     Barb Merino, RN, BSN, CCM Care Management Coordinator [facility] Direct Phone: [phone]

## 2022-01-14 DIAGNOSIS — I10 Essential (primary) hypertension: Secondary | ICD-10-CM | POA: Diagnosis not present

## 2022-01-25 DIAGNOSIS — I130 Hypertensive heart and chronic kidney disease with heart failure and stage 1 through stage 4 chronic kidney disease, or unspecified chronic kidney disease: Secondary | ICD-10-CM | POA: Diagnosis not present

## 2022-01-25 DIAGNOSIS — D649 Anemia, unspecified: Secondary | ICD-10-CM | POA: Diagnosis not present

## 2022-01-25 DIAGNOSIS — F3289 Other specified depressive episodes: Secondary | ICD-10-CM | POA: Diagnosis not present

## 2022-01-25 DIAGNOSIS — J069 Acute upper respiratory infection, unspecified: Secondary | ICD-10-CM | POA: Diagnosis not present

## 2022-01-25 DIAGNOSIS — K219 Gastro-esophageal reflux disease without esophagitis: Secondary | ICD-10-CM | POA: Diagnosis not present

## 2022-01-25 DIAGNOSIS — M5416 Radiculopathy, lumbar region: Secondary | ICD-10-CM | POA: Diagnosis not present

## 2022-01-25 DIAGNOSIS — R600 Localized edema: Secondary | ICD-10-CM | POA: Diagnosis not present

## 2022-01-25 DIAGNOSIS — I5032 Chronic diastolic (congestive) heart failure: Secondary | ICD-10-CM | POA: Diagnosis not present

## 2022-01-25 DIAGNOSIS — N1832 Chronic kidney disease, stage 3b: Secondary | ICD-10-CM | POA: Diagnosis not present

## 2022-01-25 DIAGNOSIS — R051 Acute cough: Secondary | ICD-10-CM | POA: Diagnosis not present

## 2022-01-27 ENCOUNTER — Other Ambulatory Visit: Payer: Self-pay

## 2022-01-27 DIAGNOSIS — I5032 Chronic diastolic (congestive) heart failure: Secondary | ICD-10-CM

## 2022-01-27 MED ORDER — DAPAGLIFLOZIN PROPANEDIOL 10 MG PO TABS: 10.0000 mg | ORAL_TABLET | Freq: Every day | ORAL | 6 refills | Status: DC

## 2022-01-27 NOTE — Telephone Encounter (Signed)
Patient assistance for [place] approved. Printed Rx for [facility] in order to fill

## 2022-02-01 ENCOUNTER — Ambulatory Visit: Payer: Self-pay

## 2022-02-01 ENCOUNTER — Ambulatory Visit (INDEPENDENT_AMBULATORY_CARE_PROVIDER_SITE_OTHER): Payer: Medicare Other | Admitting: Family Medicine

## 2022-02-01 VITALS — BP 178/104 | HR 58 | Ht 64.0 in

## 2022-02-01 DIAGNOSIS — M25512 Pain in left shoulder: Secondary | ICD-10-CM | POA: Diagnosis not present

## 2022-02-01 DIAGNOSIS — G8929 Other chronic pain: Secondary | ICD-10-CM | POA: Diagnosis not present

## 2022-02-01 DIAGNOSIS — M62838 Other muscle spasm: Secondary | ICD-10-CM | POA: Diagnosis not present

## 2022-02-01 DIAGNOSIS — M25511 Pain in right shoulder: Secondary | ICD-10-CM

## 2022-02-01 NOTE — Patient Instructions (Addendum)
Thank you for coming in today.   You received an injection today. Seek immediate medical attention if the joint becomes red, extremely painful, or is oozing fluid.   I've referred you to Physical Therapy.  Let us know if you don't hear from them in one week.   Let me know how it goes.

## 2022-02-01 NOTE — Progress Notes (Unsigned)
I, Peterson Lombard, LAT, ATC acting as a scribe for [name], MD.  Subjective:    CC: Bilateral shoulder pain  HPI: Patient is an 88 year old female complaining of bilateral shoulder pain that's chronic in nature, 30+ years and has a hx of fx within both shoulders. Over the last 6 weeks, the pain has worsened signifancly, to the point where she has difficulty being able to use the toilet. Pt has been seen previously by Dr. Onnie Graham, Dr. Ernestina Patches and has seen providers in [facility].  Patient locates pain to on the distal end of the clavicle on the R and most posteriorly on the L. . She has had dry needling in her trapezius as before which helps.  Her goals are to have less trapezius pain and to be able to have enough shoulder motion to do some self-care actions such as wiping and cleaning.  Radiates: yes- to elbow UE numbness/tingling: no UE weakness: no Aggravates: shoulder IR, ext, aBd, trying to transition to stand, overhead motions Treatments tried: steroid injections, dry needling  Dx imaging: 07/10/19 R & L shoulder XR  Pertinent review of Systems: No fevers or chills  Relevant historical information: Significant shoulder arthritis bilaterally.   Objective:    Vitals:   02/01/22 1414  BP: (!) 178/104  Pulse: (!) 58  SpO2: 94%   General: Well Developed, well nourished, and in no acute distress.   MSK: Right shoulder: Normal-appearing Tender palpation right trapezius. Significantly decreased range of motion.  Left shoulder: Normal-appearing Tender palpation right trapezius.  Significantly decreased range of motion  Lab and Radiology Results  Procedure: Real-time Ultrasound Guided Injection of right shoulder glenohumeral joint posterior approach Device: Philips Affiniti 50G Images permanently stored and available for review in PACS Verbal informed consent obtained.  Discussed risks and benefits of procedure. Warned about infection, bleeding, hyperglycemia damage to  structures among others. Patient expresses understanding and agreement Time-out conducted.   Noted no overlying erythema, induration, or other signs of local infection.   Skin prepped in a sterile fashion.   Local anesthesia: Topical Ethyl chloride.   With sterile technique and under real time ultrasound guidance: 40 mg of Kenalog and 2 mL of Marcaine injected into glenohumeral joint. Fluid seen entering the joint capsule.   Completed without difficulty   Pain immediately resolved suggesting accurate placement of the medication.   Advised to call if fevers/chills, erythema, induration, drainage, or persistent bleeding.   Images permanently stored and available for review in the ultrasound unit.  Impression: Technically successful ultrasound guided injection.    Procedure: Real-time Ultrasound Guided Injection of left shoulder glenohumeral joint posterior approach Device: Philips Affiniti 50G Images permanently stored and available for review in PACS Verbal informed consent obtained.  Discussed risks and benefits of procedure. Warned about infection, bleeding, hyperglycemia damage to structures among others. Patient expresses understanding and agreement Time-out conducted.   Noted no overlying erythema, induration, or other signs of local infection.   Skin prepped in a sterile fashion.   Local anesthesia: Topical Ethyl chloride.   With sterile technique and under real time ultrasound guidance: 40 mg of Kenalog and 2 mL of Marcaine injected into shoulder joint. Fluid seen entering the joint capsule.   Completed without difficulty   Pain immediately resolved suggesting accurate placement of the medication.   Advised to call if fevers/chills, erythema, induration, drainage, or persistent bleeding.   Images permanently stored and available for review in the ultrasound unit.  Impression: Technically successful ultrasound guided injection.  X-rays obtained bilateral shoulders 2021 at  [facility] show severe glenohumeral DJD. X-rays personally independently interpreted today.   Impression and Recommendations:    Assessment and Plan: 88 y.o. female with bilateral shoulder pain and lack of range of motion.  The primary causes the severe arthritis seen on prior x-rays.  We will try to treat this with glenohumeral injection today.  She also has trapezius pain and dysfunction which she reports some benefit with prior physical therapy including dry needling.  I think it is reasonable to try this again.  Will refer back to physical therapy for dry needling.  Additionally she has such a lack of range of motion in her shoulders that she can do some self-care activities such as wiping after she goes to the bathroom.  This is a pretty basic goal and I think with a little bit of physical therapy she probably could regain that much range of motion.  Think is worth trying physical therapy for this.  She reports that she is not a good surgical candidate for total shoulder replacement.  There is not much else left to do.  She may have some benefit with nerve ablation with Dr. Ernestina Patches however this is going to be extremely challenging if possible at all.Marland Kitchen  PDMP not reviewed this encounter. Orders Placed This Encounter  Procedures   [place] LIMITED JOINT SPACE STRUCTURES UP BILAT(NO LINKED CHARGES)    Order Specific Question:   Reason for Exam (SYMPTOM  OR DIAGNOSIS REQUIRED)    Answer:   Bilateral shoulder pain    Order Specific Question:   Preferred imaging location?    Answer:   [facility]   Ambulatory referral to Physical Therapy    Referral Priority:   Routine    Referral Type:   Physical Medicine    Referral Reason:   Specialty Services Required    Requested Specialty:   Physical Therapy    Number of Visits Requested:   1   No orders of the defined types were placed in this encounter.   Discussed warning signs or symptoms. Please see discharge  instructions. Patient expresses understanding.   The above documentation has been reviewed and is accurate and complete [name], M.D.

## 2022-02-02 ENCOUNTER — Other Ambulatory Visit: Payer: Medicare Other

## 2022-02-03 DIAGNOSIS — H43813 Vitreous degeneration, bilateral: Secondary | ICD-10-CM | POA: Diagnosis not present

## 2022-02-03 DIAGNOSIS — H353221 Exudative age-related macular degeneration, left eye, with active choroidal neovascularization: Secondary | ICD-10-CM | POA: Diagnosis not present

## 2022-02-03 DIAGNOSIS — H353114 Nonexudative age-related macular degeneration, right eye, advanced atrophic with subfoveal involvement: Secondary | ICD-10-CM | POA: Diagnosis not present

## 2022-02-03 DIAGNOSIS — H353123 Nonexudative age-related macular degeneration, left eye, advanced atrophic without subfoveal involvement: Secondary | ICD-10-CM | POA: Diagnosis not present

## 2022-02-07 DIAGNOSIS — Z1152 Encounter for screening for COVID-19: Secondary | ICD-10-CM | POA: Diagnosis not present

## 2022-02-07 DIAGNOSIS — R0609 Other forms of dyspnea: Secondary | ICD-10-CM | POA: Diagnosis not present

## 2022-02-07 DIAGNOSIS — N1832 Chronic kidney disease, stage 3b: Secondary | ICD-10-CM | POA: Diagnosis not present

## 2022-02-07 DIAGNOSIS — R5383 Other fatigue: Secondary | ICD-10-CM | POA: Diagnosis not present

## 2022-02-07 DIAGNOSIS — R051 Acute cough: Secondary | ICD-10-CM | POA: Diagnosis not present

## 2022-02-07 DIAGNOSIS — D649 Anemia, unspecified: Secondary | ICD-10-CM | POA: Diagnosis not present

## 2022-02-07 DIAGNOSIS — I130 Hypertensive heart and chronic kidney disease with heart failure and stage 1 through stage 4 chronic kidney disease, or unspecified chronic kidney disease: Secondary | ICD-10-CM | POA: Diagnosis not present

## 2022-02-07 DIAGNOSIS — J069 Acute upper respiratory infection, unspecified: Secondary | ICD-10-CM | POA: Diagnosis not present

## 2022-02-07 DIAGNOSIS — I5032 Chronic diastolic (congestive) heart failure: Secondary | ICD-10-CM | POA: Diagnosis not present

## 2022-02-08 ENCOUNTER — Ambulatory Visit: Payer: Medicare Other

## 2022-02-08 DIAGNOSIS — I350 Nonrheumatic aortic (valve) stenosis: Secondary | ICD-10-CM | POA: Diagnosis not present

## 2022-02-08 DIAGNOSIS — I5032 Chronic diastolic (congestive) heart failure: Secondary | ICD-10-CM | POA: Diagnosis not present

## 2022-02-09 ENCOUNTER — Ambulatory Visit: Payer: Medicare Other

## 2022-02-14 DIAGNOSIS — I10 Essential (primary) hypertension: Secondary | ICD-10-CM | POA: Diagnosis not present

## 2022-02-15 ENCOUNTER — Ambulatory Visit: Payer: Medicare Other

## 2022-02-15 DIAGNOSIS — I5032 Chronic diastolic (congestive) heart failure: Secondary | ICD-10-CM | POA: Diagnosis not present

## 2022-02-17 ENCOUNTER — Encounter: Payer: Self-pay | Admitting: Cardiology

## 2022-02-17 ENCOUNTER — Other Ambulatory Visit: Payer: Self-pay

## 2022-02-17 ENCOUNTER — Ambulatory Visit: Payer: Medicare Other | Admitting: Cardiology

## 2022-02-17 VITALS — BP 164/66 | HR 61 | Resp 15 | Ht 64.0 in | Wt 208.2 lb

## 2022-02-17 DIAGNOSIS — I480 Paroxysmal atrial fibrillation: Secondary | ICD-10-CM

## 2022-02-17 DIAGNOSIS — I350 Nonrheumatic aortic (valve) stenosis: Secondary | ICD-10-CM

## 2022-02-17 DIAGNOSIS — I5032 Chronic diastolic (congestive) heart failure: Secondary | ICD-10-CM

## 2022-02-17 DIAGNOSIS — I10 Essential (primary) hypertension: Secondary | ICD-10-CM

## 2022-02-17 MED ORDER — AMLODIPINE BESYLATE 5 MG PO TABS: 5.0000 mg | ORAL_TABLET | ORAL | 2 refills | Status: DC

## 2022-02-17 NOTE — Progress Notes (Signed)
Primary Physician/Referring:  [name], MD  [name], female    DOB: 04-Feb-1933, 88 y.o.   MRN: [id number]  Chief Complaint  Patient presents with  . Congestive Heart Failure  . Hypertension  . Follow-up    6 weeks    HPI:    Lisa Mcclure  is a 88 y.o.  female  with past medical history of obesity, chronic diastolic CHF, hypertensive heart disease and chronic 3A-B kidney disease, paroxysmal atrial fibrillation, last documented episode August 2021, anemia, hemorrhoids, diverticulosis, esophageal reflux, depression, OSA intolerant to CPAP on nocturnal 02, rheumatoid arthritis, and venous insufficiency, aortic and coronary calcification, s/p AAA repair on 12/20/2019 being followed by vascular surgery.    Patient presents to the office for follow-up of heart failure. She has been losing weight but states she has not felt well and has "crud" last two weeks.  No leg edema, No PND or orthopnea.  She has been compliant with medications.  States that she is tired of taking furosemide as she has incontinence and has debilitated her socially.  She is still fairly active for her age, likes to go out and mingle with friends and also church group.  She lives independently.   Past Medical History:  Diagnosis Date  . Anemia    h/o hemorrhoidal bleeding and blood transfusion  . Chronic diastolic CHF (congestive heart failure) ([place])   . Depression   . Diverticulosis   . DOE (dyspnea on exertion)   . Esophageal reflux   . GERD (gastroesophageal reflux disease)   . Hypothyroidism   . Insomnia   . LBP (low back pain)   . OAB (overactive bladder)   . Obesity   . OSA (obstructive sleep apnea)   . Osteoarthritis   . Paroxysmal atrial fibrillation (HCC)   . Rheumatoid arthritis(714.0)   . Shoulder pain, bilateral   . Unspecified essential hypertension   . Venous insufficiency    Past Surgical History:  Procedure Laterality Date  . ABDOMINAL AORTIC  ENDOVASCULAR STENT GRAFT N/A 12/20/2019   Procedure: Aortogram including catheter selection of aorta and bilateral iliac arteriogram, Endovascular repair of infrarenal abdominal aortic aneurysm with bifurcated stent graft (26 mm x 14 x 12 main body, right bell bottom with a 20 mm x 10 cm piece, and left bell bottom with a 16 mm x 12 cm piece) ;  Surgeon: Marty Heck, MD;  Location: [facility];  Service: Vascular;  Laterality: N/A;  . APPENDECTOMY  1953  . BIOPSY  06/18/2021   Procedure: BIOPSY;  Surgeon: Ronnette Juniper, MD;  Location: [facility] ENDOSCOPY;  Service: Gastroenterology;;  . bladder abduction-1996  1996  . breast biopsy Right 1980  . CARDIOVERSION N/A 12/24/2019   Procedure: CARDIOVERSION;  Surgeon: Adrian Prows, MD;  Location: [facility] ENDOSCOPY;  Service: Cardiovascular;  Laterality: N/A;  . [place]  . COSMETIC SURGERY  1996  . CYSTOCELE REPAIR    . ESOPHAGOGASTRODUODENOSCOPY (EGD) WITH PROPOFOL N/A 06/18/2021   Procedure: ESOPHAGOGASTRODUODENOSCOPY (EGD) WITH PROPOFOL;  Surgeon: Ronnette Juniper, MD;  Location: [facility] ENDOSCOPY;  Service: Gastroenterology;  Laterality: N/A;  . FLEXIBLE SIGMOIDOSCOPY N/A 04/10/2015   Procedure: FLEXIBLE SIGMOIDOSCOPY;  Surgeon: Arta Silence, MD;  Location: [facility] ENDOSCOPY;  Service: Endoscopy;  Laterality: N/A;  . knee arthroscopy Right 1996, 2010  . KNEE ARTHROSCOPY W/ AUTOGENOUS CARTILAGE IMPLANTATION (ACI) PROCEDURE Left 1994, 1995  . REFRACTIVE SURGERY  01/2020  . SHOULDER SURGERY  1990  .  TEE WITHOUT CARDIOVERSION N/A 12/24/2019   Procedure: TRANSESOPHAGEAL ECHOCARDIOGRAM (TEE);  Surgeon: Adrian Prows, MD;  Location: [place];  Service: Cardiovascular;  Laterality: N/A;  . TOTAL ABDOMINAL HYSTERECTOMY  1972  . ULTRASOUND GUIDANCE FOR VASCULAR ACCESS Bilateral 12/20/2019   Procedure: Ultrasound-guided access of bilateral common femoral arteries for delivery of endograft and percutaneous closure;  Surgeon: Marty Heck, MD;   Location: [facility] OR;  Service: Vascular;  Laterality: Bilateral;  . VESICOVAGINAL FISTULA CLOSURE W/ TAH    . WRIST SURGERY  1967   Family History  Problem Relation Age of Onset  . Lung cancer Mother   . COPD Father   . Breast cancer Maternal Aunt   . Heart attack Maternal Grandmother 80  . Lung cancer Son     Social History   Tobacco Use  . Smoking status: Former    Packs/day: 0.20    Years: 10.00    Total pack years: 2.00    Types: Cigarettes    Quit date: 04/11/1984    Years since quitting: 37.8    Passive exposure: Past  . Smokeless tobacco: Never  Substance Use Topics  . Alcohol use: Yes    Alcohol/week: 0.0 standard drinks of alcohol    Comment: cocktail once a week   Marital Status: Widowed  ROS  Review of Systems  Cardiovascular:  Positive for dyspnea on exertion and leg swelling (on and off). Negative for chest pain, orthopnea, palpitations and syncope.  Genitourinary:  Positive for bladder incontinence and urgency.   Objective  Blood pressure (!) 164/66, pulse 61, resp. rate 15, height _0  (1.626 m), weight 208 lb 3.2 oz (94.4 kg), SpO2 90 %.     02/17/2022    3:14 PM 02/17/2022    3:09 PM 02/01/2022    2:14 PM  Vitals with BMI  Height  _1  _2   Weight  208 lbs 3 oz   BMI  32.44   Systolic 010 272 536  Diastolic 66 70 644  Pulse  61 58     Physical Exam Vitals reviewed.  Constitutional:      Appearance: She is well-developed. She is obese.  Neck:     Vascular: Carotid bruit present. No JVD.  Cardiovascular:     Rate and Rhythm: Normal rate and regular rhythm.     Pulses: Intact distal pulses.     Heart sounds: S1 normal and S2 normal. Murmur heard.     Harsh midsystolic murmur is present with a grade of 3/6 at the upper right sternal border radiating to the apex.     No gallop.  Pulmonary:     Effort: Pulmonary effort is normal. No accessory muscle usage or respiratory distress.     Breath sounds: No wheezing or rhonchi.  Abdominal:      General: Bowel sounds are normal.     Palpations: Abdomen is soft.  Musculoskeletal:     Right lower leg: Edema (minimal) present.     Left lower leg: Edema (minimal) present.   Laboratory examination:   Recent Labs    06/15/21 1712 06/16/21 0644 06/20/21 0536 12/01/21 1518  NA 137 136 138 140  K 4.2 4.0 4.3 4.6  CL 93* 95* 93* 97  CO2 33* 32 37* 25  GLUCOSE 98 240* 100* 100*  BUN 52* 50* 36* 32*  CREATININE 2.00* 1.59* 1.28* 1.62*  CALCIUM 8.7* 8.7* 8.5* 8.9  GFRNONAA 24* 31* 41*  --    CrCl cannot be calculated (Patient's  most recent lab result is older than the maximum 21 days allowed.).     Latest Ref Rng & Units 12/01/2021    3:18 PM 06/20/2021    5:36 AM 06/16/2021    6:44 AM  CMP  Glucose 70 - 99 mg/dL 100  100  240   BUN 8 - 27 mg/dL 32  36  50   Creatinine 0.57 - 1.00 mg/dL 1.62  1.28  1.59   Sodium 134 - 144 mmol/L 140  138  136   Potassium 3.5 - 5.2 mmol/L 4.6  4.3  4.0   Chloride 96 - 106 mmol/L 97  93  95   CO2 20 - 29 mmol/L 25  37  32   Calcium 8.7 - 10.3 mg/dL 8.9  8.5  8.7       Latest Ref Rng & Units 06/21/2021    4:47 AM 06/20/2021    5:24 AM 06/18/2021    4:38 AM  CBC  WBC 4.0 - 10.5 K/uL 7.6  7.4  8.7   Hemoglobin 12.0 - 15.0 g/dL 8.7  8.7  8.5   Hematocrit 36.0 - 46.0 % 29.4  29.6  28.0   Platelets 150 - 400 K/uL 376  377  367    Lipid Panel    Component Value Date/Time   CHOL 126 03/14/2019 1121   TRIG 82 03/14/2019 1121   HDL 49 03/14/2019 1121   CHOLHDL 5.1 CALC 12/19/2006 0916   VLDL 26 12/19/2006 0916   LDLCALC 61 03/14/2019 1121   LDLDIRECT 159.2 12/19/2006 0916   HEMOGLOBIN A1C No results found for: "HGBA1C", "MPG" TSH No results for input(s): "TSH" in the last 8760 hours.  External labs:  02/22/2021: BUN 36, creatinine 1.3, GFR 39, sodium 130, potassium 5.4 Hgb 11.3, HCT 35.1, platelet 336 Magnesium 2.4  07/20/2020: Glucose 84, BUN 30, creatinine 1.22, GFR 48, sodium 137, potassium 4.9,  BNP 321  Cholesterol, total  164.000 m 06/21/2019 HDL 46 MG/DL 06/21/2019 LDL 99.000 mg 06/21/2019 Triglycerides 93.000 06/21/2019  04/14/2020: Serum glucose 86 mg, BUN 22, creatinine 1.13, EGFR 44 mL, sodium 138, potassium 4.3.  CMP otherwise normal. Hb 10.2/HCT 31.5, platelets 383.  02/17/2020:  Hemoglobin 11.1, hematocrit 34.2, platelets 383 BUN 26, creatinine 0.9, EGFR 59, sodium 136, potassium 4.6  01/07/2020: BUN 29, GFR 57, creatinine 0.93, BUN/CR 31, sodium 133, potassium 4.6, chloride 93, BMP otherwise normal  Cholesterol, total 164.000 m 06/21/2019 Triglycerides 93.000 06/21/2019 HDL 46 MG/DL 06/21/2019 LDL 99.000 mg 06/21/2019  Glucose Random 87.000 mg 06/21/2019 BUN 25.000 mg 06/21/2019 Creatinine, Serum 0.800 mg/ 06/21/2019  TSH 3.220 micr 06/21/2019  AST 18, ALT 11 09/14/2018  FOBT: Normal 04/24/2018  MicroAlbumin Urine 13.000 03/23/2017 MicroAlbumin/Creat 37.9 MG/ 03/23/2017  Allergies   Allergies  Allergen Reactions  . Statins Other (See Comments)    Muscle aches and INTERNAL BLEEDING  . Atorvastatin     Other reaction(s): Myalgias  . Gabapentin Other (See Comments)    "Made me loopy"  . Methocarbamol Other (See Comments)    "Made me loopy"    Medications Prior to Visit:   Outpatient Medications Prior to Visit  Medication Sig Dispense Refill  . albuterol (PROVENTIL) (2.5 MG/3ML) 0.083% nebulizer solution Take 3 mLs (2.5 mg total) by nebulization every 6 (six) hours as needed for wheezing or shortness of breath. 75 mL 12  . albuterol (VENTOLIN HFA) 108 (90 Base) MCG/ACT inhaler Inhale 2 puffs into the lungs every 6 (six) hours as needed for wheezing or shortness  of breath. 8 g 2  . amiodarone (PACERONE) 100 MG tablet TAKE TWO TABLETS BY MOUTH EVERY MORNING 60 tablet 5  . apixaban (ELIQUIS) 2.5 MG TABS tablet Take 1 tablet (2.5 mg total) by mouth 2 (two) times daily. 180 tablet 3  . Bempedoic Acid-Ezetimibe (NEXLIZET) 180-10 MG TABS Take 1 capsule by mouth daily. (Patient taking  differently: Take 1 capsule by mouth daily. Taking it at bedtime) 30 tablet 3  . budesonide (PULMICORT) 0.25 MG/2ML nebulizer solution Take 2 mLs (0.25 mg total) by nebulization 2 (two) times daily. 60 mL 12  . busPIRone (BUSPAR) 7.5 MG tablet Take 7.5 mg by mouth 2 (two) times daily.    . carvedilol (COREG) 6.25 MG tablet Take 1 tablet (6.25 mg total) by mouth 2 (two) times daily with a meal for 14 days. 28 tablet 0  . Cholecalciferol (VITAMIN D3) 125 MCG (5000 UT) CAPS Take 5,000 Units by mouth at bedtime.    . dapagliflozin propanediol (FARXIGA) 10 MG TABS tablet Take 1 tablet (10 mg total) by mouth daily before breakfast. 30 tablet 6  . diclofenac sodium (VOLTAREN) 1 % GEL Apply 2.25 g topically 3 (three) times daily as needed (for pain).     . ferrous sulfate 325 (65 FE) MG tablet Take 1 tablet (325 mg total) by mouth daily with breakfast. 90 tablet 0  . furosemide (LASIX) 20 MG tablet Take 1 tablet (20 mg total) by mouth daily as needed for fluid. 90 tablet 3  . HYDROcodone-acetaminophen (NORCO) 7.5-325 MG tablet Take 1 tablet by mouth every 6 (six) hours as needed for moderate pain. 20 tablet 0  . latanoprost (XALATAN) 0.005 % ophthalmic solution Place 1 drop into both eyes at bedtime.    Marland Kitchen levothyroxine (SYNTHROID) 125 MCG tablet Take 125 mcg by mouth daily before breakfast.     . losartan (COZAAR) 100 MG tablet Take 1 tablet (100 mg total) by mouth daily. TAKE ONE TABLET BY MOUTH AT NOON Strength: 100 mg 90 tablet 3  . metolazone (ZAROXOLYN) 2.5 MG tablet Take 1 tablet (2.5 mg total) by mouth as needed (volume overload). 30 tablet 0  . Multiple Vitamins-Minerals (ICAPS AREDS 2 PO) Take 1 capsule by mouth 2 (two) times daily.    Marland Kitchen rOPINIRole (REQUIP) 1 MG tablet Take 1 mg by mouth See admin instructions. Take 1 mg by mouth once a day between 3 PM-4 PM and an additional 1 mg in the evening, if no relief  12  . pantoprazole (PROTONIX) 40 MG tablet Take 1 tablet (40 mg total) by mouth daily.  Take Protonix 40 mg daily after 08/20/2021 when on Eliquis. (Patient taking differently: Take 40 mg by mouth 2 (two) times daily. Take Protonix 40 mg daily after 08/20/2021 when on Eliquis.) 90 tablet 0   No facility-administered medications prior to visit.   Final Medications at End of Visit    Current Meds  Medication Sig  . albuterol (PROVENTIL) (2.5 MG/3ML) 0.083% nebulizer solution Take 3 mLs (2.5 mg total) by nebulization every 6 (six) hours as needed for wheezing or shortness of breath.  Marland Kitchen albuterol (VENTOLIN HFA) 108 (90 Base) MCG/ACT inhaler Inhale 2 puffs into the lungs every 6 (six) hours as needed for wheezing or shortness of breath.  Marland Kitchen amiodarone (PACERONE) 100 MG tablet TAKE TWO TABLETS BY MOUTH EVERY MORNING  . apixaban (ELIQUIS) 2.5 MG TABS tablet Take 1 tablet (2.5 mg total) by mouth 2 (two) times daily.  . Bempedoic Acid-Ezetimibe (NEXLIZET) 180-10 MG  TABS Take 1 capsule by mouth daily. (Patient taking differently: Take 1 capsule by mouth daily. Taking it at bedtime)  . budesonide (PULMICORT) 0.25 MG/2ML nebulizer solution Take 2 mLs (0.25 mg total) by nebulization 2 (two) times daily.  . busPIRone (BUSPAR) 7.5 MG tablet Take 7.5 mg by mouth 2 (two) times daily.  . carvedilol (COREG) 6.25 MG tablet Take 1 tablet (6.25 mg total) by mouth 2 (two) times daily with a meal for 14 days.  . Cholecalciferol (VITAMIN D3) 125 MCG (5000 UT) CAPS Take 5,000 Units by mouth at bedtime.  . dapagliflozin propanediol (FARXIGA) 10 MG TABS tablet Take 1 tablet (10 mg total) by mouth daily before breakfast.  . diclofenac sodium (VOLTAREN) 1 % GEL Apply 2.25 g topically 3 (three) times daily as needed (for pain).   . ferrous sulfate 325 (65 FE) MG tablet Take 1 tablet (325 mg total) by mouth daily with breakfast.  . furosemide (LASIX) 20 MG tablet Take 1 tablet (20 mg total) by mouth daily as needed for fluid.  Marland Kitchen HYDROcodone-acetaminophen (NORCO) 7.5-325 MG tablet Take 1 tablet by mouth every 6 (six)  hours as needed for moderate pain.  Marland Kitchen latanoprost (XALATAN) 0.005 % ophthalmic solution Place 1 drop into both eyes at bedtime.  Marland Kitchen levothyroxine (SYNTHROID) 125 MCG tablet Take 125 mcg by mouth daily before breakfast.   . losartan (COZAAR) 100 MG tablet Take 1 tablet (100 mg total) by mouth daily. TAKE ONE TABLET BY MOUTH AT NOON Strength: 100 mg  . metolazone (ZAROXOLYN) 2.5 MG tablet Take 1 tablet (2.5 mg total) by mouth as needed (volume overload).  . Multiple Vitamins-Minerals (ICAPS AREDS 2 PO) Take 1 capsule by mouth 2 (two) times daily.  Marland Kitchen rOPINIRole (REQUIP) 1 MG tablet Take 1 mg by mouth See admin instructions. Take 1 mg by mouth once a day between 3 PM-4 PM and an additional 1 mg in the evening, if no relief  . amLODipine (NORVASC) 5 MG tablet Take 1 tablet (5 mg total) by mouth every morning.    Radiology:   CT Abdomen and Pelvis W Contrast 12/20/2019: 1. Large Infrarenal Abdominal Aortic Aneurysm measuring up to 5.7 cm with mild perianeurysmal inflammation. Differential considerations include impending AAA rupture, mycotic AAA, symptomatic AAA. Recommend urgent transfer to emergency department and Vascular Surgery consultation. Aortic aneurysm NOS (ICD10-I71.9). Aortic Atherosclerosis (ICD10-I70.0). 2. No other acute or inflammatory process identified. 5 cm fat containing left pelvic hernia. Large bowel diverticulosis. 3. Simple appearing 4 cm left ovarian cyst, recommend follow-up Ultrasound in 6-12 months  Chest X-ray 12/20/2019:  Diffuse cardiac enlargement. Emphysematous changes in the lungs. Coarse interstitial pattern to the lungs could represent fibrosis or edema. No focal consolidation. No pleural effusions. No pneumothorax. Calcified and tortuous aorta. Degenerative changes in the shoulders and spine. Old rib fractures. Similar appearance to previous study. IMPRESSION: 1. Cardiac enlargement. 2. Coarse interstitial pattern to the lungs could represent fibrosis or  edema.  Abdominal X-Ray 12/20/2019:  Interval placement of aorto iliac stent graft. Nonobstructive bowel gas pattern.  Cardiac Studies:   Lexiscan myoview stress test 12/08/2017: 1. Lexiscan stress test was performed. Exercise capacity was not assessed. Stress symptoms included abdominal pain. Blood pressure was normal. The resting and stress electrocardiogram demonstrated normal sinus rhythm, normal resting conduction, no resting arrhythmias and normal rest repolarization. 2. The overall quality of the study is good. There is no evidence of abnormal lung activity. Stress and rest SPECT images demonstrate homogeneous tracer distribution throughout the myocardium. Gated  SPECT imaging reveals normal myocardial thickening and wall motion. The left ventricular ejection fraction was normal (54%). 3. Low risk study  Ambulatory cardiac telemetry 14 days 03/22/2021 - 04/04/2021: Predominant underlying rhythm was sinus with first-degree AV block.  Patient with 19 brief episodes of SVT which were asymptomatic.  Frequent PVCs with 20.4% burden and episodes of ventricular bigeminy and trigeminy which correlated with patient triggered events.  No evidence of atrial fibrillation, ventricular tachycardia, pauses >3 seconds, or high degree AV block.  PCV ECHOCARDIOGRAM COMPLETE 02/08/2022  Narrative Echocardiogram 02/08/2022: Normal LV systolic function with visual EF 55-60%. Left ventricle cavity is normal in size. Moderate concentric hypertrophy of the left ventricle. Normal global wall motion. Indeterminate diastolic filling pattern. Calculated EF 56%. Left atrial cavity is severely dilated at 46.9 ml/m^2. Trileaflet aortic valve. Moderate aortic stenosis. Mild (Grade I) aortic regurgitation. Mild aortic valve leaflet calcification. AVA (VTI) measures 0.8 cm^2. AV Mean Grad measures 22.9 mmHg. AV Pk Vel measures 3.28 m/s. Mild (Grade I) mitral regurgitation. Mild calcification of the mitral valve annulus.  Mild mitral valve leaflet thickening with mild calcification. Mildly restricted mitral valve leaflets. Structurally normal tricuspid valve with trace regurgitation. No evidence of pulmonary hypertension. Compared to 04/2021, aortic stenosis is still moderate and there is mild aortic regurgitation present.  Previously noted moderate pulm hypertension not present.  EKG   08/26/2021: Normal sinus rhythm at a rate of 62 bpm.   Assessment     ICD-10-CM   1. Chronic diastolic (congestive) heart failure (HCC)  I50.32     2. Primary hypertension  I10 amLODipine (NORVASC) 5 MG tablet    3. Paroxysmal atrial fibrillation (HCC)  I48.0     4. Moderate aortic stenosis  I35.0      This patients CHA2DS2-VASc Score 6 (CHF, HTN, vasc, age, F) and yearly risk of stroke 9.8%.  Meds ordered this encounter  Medications  . amLODipine (NORVASC) 5 MG tablet    Sig: Take 1 tablet (5 mg total) by mouth every morning.    Dispense:  30 tablet    Refill:  2   Medications Discontinued During This Encounter  Medication Reason  . pantoprazole (PROTONIX) 40 MG tablet    Recommendations:   Lisa Mcclure  is a 88 y.o.  female  with past medical history of obesity, chronic diastolic CHF, hypertensive heart disease and chronic 3A-B kidney disease, paroxysmal atrial fibrillation, last documented episode August 2021, anemia, hemorrhoids, diverticulosis, esophageal reflux, depression, OSA intolerant to CPAP on nocturnal 02, rheumatoid arthritis, and venous insufficiency, aortic and coronary calcification, s/p AAA repair on 12/20/2019 being followed by vascular surgery.    Last GI bleed was in March 2023 secondary to gastric ulcer requiring blood transfusion.  Fortunately she has not had any further episodes of GI bleed.  She is tolerating anticoagulation with Eliquis.  There was no culprit medication or NSAIDs involved.  She has had GI bleed in 2017 requiring blood transfusion as well  1. Chronic diastolic  (congestive) heart failure (HCC) Patient presently not in acute decompensated heart failure.  Echocardiogram reveals aortic valve stenosis has remained stable at moderate.  Hence do not think aortic stenosis is causing her diastolic dysfunction and heart failure.  Her weight has been stable.  Continue present medical therapy.  2. Primary hypertension Blood pressure still remains elevated.  Suspect patient not being on CPAP is contributing majorly to her last fluctuation in blood pressure control.  She is presently enrolled in [facility], weights are remained stable.  I will add Amlodipine 5 mg daily and will see her back in 6 weeks.   3. Paroxysmal atrial fibrillation [facility]) Patient has an appointment to see Dr. Sherren Mocha for LAA appendage closure with Watchman device due to high risk for GI bleed.   Patient enrolled in [facility] 12/30/2021 October 2023 data Average Weight Level 210.41 lbs Lowest Weight Level 207.8 lbs Highest Weight Level 213.4 lbs    Adrian Prows, MD, [facility] 02/17/2022, 3:52 PM Office: [phone] Fax: [phone] Pager: [phone]

## 2022-02-17 NOTE — Telephone Encounter (Signed)
Patient asked amlodipine be sent to [facility]

## 2022-02-25 ENCOUNTER — Telehealth: Payer: Self-pay | Admitting: Cardiology

## 2022-02-25 ENCOUNTER — Other Ambulatory Visit: Payer: Self-pay

## 2022-02-25 DIAGNOSIS — I480 Paroxysmal atrial fibrillation: Secondary | ICD-10-CM

## 2022-02-25 DIAGNOSIS — I5032 Chronic diastolic (congestive) heart failure: Secondary | ICD-10-CM

## 2022-02-25 DIAGNOSIS — I4891 Unspecified atrial fibrillation: Secondary | ICD-10-CM

## 2022-02-25 DIAGNOSIS — I10 Essential (primary) hypertension: Secondary | ICD-10-CM

## 2022-02-25 MED ORDER — APIXABAN 2.5 MG PO TABS: 2.5000 mg | ORAL_TABLET | Freq: Two times a day (BID) | ORAL | 3 refills | Status: DC

## 2022-02-25 MED ORDER — ATENOLOL 50 MG PO TABS: 50.0000 mg | ORAL_TABLET | Freq: Every day | ORAL | 2 refills | Status: DC

## 2022-02-25 NOTE — Telephone Encounter (Signed)
ICD-10-CM   1. Primary hypertension  I10 atenolol (TENORMIN) 50 MG tablet    2. Paroxysmal atrial fibrillation (HCC)  I48.0 atenolol (TENORMIN) 50 MG tablet     Medications Discontinued During This Encounter  Medication Reason   Bempedoic Acid-Ezetimibe (NEXLIZET) 180-10 MG TABS Patient Preference   amLODipine (NORVASC) 5 MG tablet Patient Preference   carvedilol (COREG) 6.25 MG tablet Change in therapy    Meds ordered this encounter  Medications   atenolol (TENORMIN) 50 MG tablet    Sig: Take 1 tablet (50 mg total) by mouth daily.    Dispense:  30 tablet    Refill:  2    Discontinue Coreg    Patient called [place] stating that she feels very depressed that she is on multiple medications.  She is presently 88 years of age.  We will go ahead and discontinue Nexlizet, amlodipine and carvedilol and switch her to atenolol 50 mg daily.  Could also consider doubling up on the dose of the buspirone.

## 2022-02-25 NOTE — Telephone Encounter (Signed)
Printing for patient assistance application

## 2022-02-28 ENCOUNTER — Other Ambulatory Visit: Payer: Self-pay

## 2022-02-28 DIAGNOSIS — I480 Paroxysmal atrial fibrillation: Secondary | ICD-10-CM

## 2022-02-28 DIAGNOSIS — I10 Essential (primary) hypertension: Secondary | ICD-10-CM

## 2022-02-28 MED ORDER — ATENOLOL 50 MG PO TABS: 50.0000 mg | ORAL_TABLET | Freq: Every day | ORAL | 2 refills | Status: DC

## 2022-02-28 NOTE — Telephone Encounter (Signed)
Patient will stop taking amlodipine and Nexlizet per previous discussion with Dr. Einar Gip.  Patient aware of switch from Coreg to atenolol and requests atenolol be sent to [facility].   Medication reconciliation completed.

## 2022-03-07 ENCOUNTER — Encounter: Payer: Self-pay | Admitting: Cardiovascular Disease

## 2022-03-07 ENCOUNTER — Ambulatory Visit: Payer: Medicare Other | Attending: Cardiovascular Disease | Admitting: Cardiovascular Disease

## 2022-03-07 VITALS — BP 120/70 | HR 47 | Ht 63.0 in | Wt 211.2 lb

## 2022-03-07 DIAGNOSIS — I480 Paroxysmal atrial fibrillation: Secondary | ICD-10-CM | POA: Diagnosis not present

## 2022-03-07 NOTE — Patient Instructions (Signed)
Medication Instructions:  Your physician recommends that you continue on your current medications as directed. Please refer to the Current Medication list given to you today.  *If you need a refill on your cardiac medications before your next appointment, please call your pharmacy*   Lab Work: NONE If you have labs (blood work) drawn today and your tests are completely normal, you will receive your results only by: [place] (if you have MyChart) OR A paper copy in the mail If you have any lab test that is abnormal or we need to change your treatment, we will call you to review the results.   Testing/Procedures: NONE   Follow-Up: At [facility], you and your health needs are our priority.  As part of our continuing mission to provide you with exceptional heart care, we have created designated Provider Care Teams.  These Care Teams include your primary Cardiologist (physician) and Advanced Practice Providers (APPs -  Physician Assistants and Nurse Practitioners) who all work together to provide you with the care you need, when you need it.  We recommend signing up for the patient portal called "MyChart".  Sign up information is provided on this After Visit Summary.  MyChart is used to connect with patients for Virtual Visits (Telemedicine).  Patients are able to view lab/test results, encounter notes, upcoming appointments, etc.  Non-urgent messages can be sent to your provider as well.   To learn more about what you can do with MyChart, go to [email or web address].    Your next appointment:   As Needed  The format for your next appointment:   In Person  Provider:   Sherren Mocha, MD      Important Information About Sugar

## 2022-03-07 NOTE — Progress Notes (Signed)
Watchman Consult Note   Date:  03/07/2022   ID:  Lisa Mcclure, DOB Oct 08, 1932, MRN [id number]  PCP:  [name], MD  Cardiologist:  Adrian Prows, MD Primary Electrophysiologist: none Referring Physician: Adrian Prows, MD   CC: to discuss Watchman implant    History of Present Illness: Lisa Mcclure is a 88 y.o. female referred by Dr Einar Gip for evaluation of atrial fibrillation and stroke prevention. She has paroxysmal atrial fibrillation.  The patient has been evaluated by their referring physician and is felt to be a poor candidate for long term Early due to recurrent GI bleeding.  She therefore presents today for Watchman evaluation.   The patient has a history of lower gastrointestinal bleeding in 2016.  She states at that time she was quite anemic and required 4 units of packed red blood cells.  She had a recurrent episode of anemia this year when she did not appreciate any change in her bowel habits.  However, she required a single unit of packed red blood cells.  An upper endoscopy was performed and showed a clean-based gastric ulcer with no evidence of active bleeding.  There is also mild gastritis noted.  The patient has been on apixaban 2.5 mg twice daily since that time and denies any further bleeding problems.  She is here with her son today.  She states that she has not felt well for several months.  She is short of breath with activity and complains of marked fatigue.  She is unsteady on her feet and ambulates with a walker.  She denies chest pain or pressure.  She is short of breath with low-level activity such as light housework or making her bed.  Past Medical History:  Diagnosis Date   Anemia    h/o hemorrhoidal bleeding and blood transfusion   Chronic diastolic CHF (congestive heart failure) (HCC)    Depression    Diverticulosis    DOE (dyspnea on exertion)    Esophageal reflux    GERD (gastroesophageal reflux disease)    Hypothyroidism    Insomnia    LBP  (low back pain)    OAB (overactive bladder)    Obesity    OSA (obstructive sleep apnea)    Osteoarthritis    Paroxysmal atrial fibrillation (HCC)    Rheumatoid arthritis(714.0)    Shoulder pain, bilateral    Unspecified essential hypertension    Venous insufficiency    Past Surgical History:  Procedure Laterality Date   ABDOMINAL AORTIC ENDOVASCULAR STENT GRAFT N/A 12/20/2019   Procedure: Aortogram including catheter selection of aorta and bilateral iliac arteriogram, Endovascular repair of infrarenal abdominal aortic aneurysm with bifurcated stent graft (26 mm x 14 x 12 main body, right bell bottom with a 20 mm x 10 cm piece, and left bell bottom with a 16 mm x 12 cm piece) ;  Surgeon: Marty Heck, MD;  Location: [facility] OR;  Service: Vascular;  Laterality: N/A;   [place]   BIOPSY  06/18/2021   Procedure: BIOPSY;  Surgeon: Ronnette Juniper, MD;  Location: [facility] ENDOSCOPY;  Service: Gastroenterology;;   bladder abduction-1996  1996   breast biopsy Right 1980   CARDIOVERSION N/A 12/24/2019   Procedure: CARDIOVERSION;  Surgeon: Adrian Prows, MD;  Location: [facility] ENDOSCOPY;  Service: Cardiovascular;  Laterality: N/A;   [place]     ESOPHAGOGASTRODUODENOSCOPY (EGD) WITH PROPOFOL N/A 06/18/2021  Procedure: ESOPHAGOGASTRODUODENOSCOPY (EGD) WITH PROPOFOL;  Surgeon: Ronnette Juniper, MD;  Location: [facility] ENDOSCOPY;  Service: Gastroenterology;  Laterality: N/A;   FLEXIBLE SIGMOIDOSCOPY N/A 04/10/2015   Procedure: FLEXIBLE SIGMOIDOSCOPY;  Surgeon: Arta Silence, MD;  Location: [facility] ENDOSCOPY;  Service: Endoscopy;  Laterality: N/A;   knee arthroscopy Right 1996, 2010   KNEE ARTHROSCOPY W/ AUTOGENOUS CARTILAGE IMPLANTATION (ACI) PROCEDURE Left 1994, 1995   REFRACTIVE SURGERY  01/2020   SHOULDER SURGERY  1990   TEE WITHOUT CARDIOVERSION N/A 12/24/2019   Procedure: TRANSESOPHAGEAL ECHOCARDIOGRAM (TEE);  Surgeon: Adrian Prows, MD;   Location: [place];  Service: Cardiovascular;  Laterality: N/A;   [place] Bilateral 12/20/2019   Procedure: Ultrasound-guided access of bilateral common femoral arteries for delivery of endograft and percutaneous closure;  Surgeon: Marty Heck, MD;  Location: [facility] OR;  Service: Vascular;  Laterality: Bilateral;   VESICOVAGINAL FISTULA CLOSURE W/ TAH     WRIST SURGERY  1967     Current Outpatient Medications  Medication Sig Dispense Refill   albuterol (PROVENTIL) (2.5 MG/3ML) 0.083% nebulizer solution Take 3 mLs (2.5 mg total) by nebulization every 6 (six) hours as needed for wheezing or shortness of breath. 75 mL 12   albuterol (VENTOLIN HFA) 108 (90 Base) MCG/ACT inhaler Inhale 2 puffs into the lungs every 6 (six) hours as needed for wheezing or shortness of breath. 8 g 2   amiodarone (PACERONE) 100 MG tablet TAKE TWO TABLETS BY MOUTH EVERY MORNING 60 tablet 5   apixaban (ELIQUIS) 2.5 MG TABS tablet Take 1 tablet (2.5 mg total) by mouth 2 (two) times daily. 180 tablet 3   atenolol (TENORMIN) 50 MG tablet Take 1 tablet (50 mg total) by mouth daily. 30 tablet 2   budesonide (PULMICORT) 0.25 MG/2ML nebulizer solution Take 2 mLs (0.25 mg total) by nebulization 2 (two) times daily. 60 mL 12   busPIRone (BUSPAR) 7.5 MG tablet Take 7.5 mg by mouth 2 (two) times daily.     dapagliflozin propanediol (FARXIGA) 10 MG TABS tablet Take 1 tablet (10 mg total) by mouth daily before breakfast. 30 tablet 6   diclofenac sodium (VOLTAREN) 1 % GEL Apply 2.25 g topically 3 (three) times daily as needed (for pain).      furosemide (LASIX) 20 MG tablet Take 1 tablet (20 mg total) by mouth daily as needed for fluid. 90 tablet 3   HYDROcodone-acetaminophen (NORCO) 7.5-325 MG tablet Take 1 tablet by mouth every 6 (six) hours as needed for moderate pain. 20 tablet 0   latanoprost (XALATAN) 0.005 % ophthalmic solution Place 1 drop into both  eyes at bedtime.     levothyroxine (SYNTHROID) 125 MCG tablet Take 125 mcg by mouth daily before breakfast.      losartan (COZAAR) 100 MG tablet Take 1 tablet (100 mg total) by mouth daily. TAKE ONE TABLET BY MOUTH AT NOON Strength: 100 mg 90 tablet 3   rOPINIRole (REQUIP) 1 MG tablet Take 1 mg by mouth See admin instructions. Take 1 mg by mouth once a day between 3 PM-4 PM and an additional 1 mg in the evening, if no relief  12   ferrous sulfate 325 (65 FE) MG tablet Take 1 tablet (325 mg total) by mouth daily with breakfast. 90 tablet 0   metolazone (ZAROXOLYN) 2.5 MG tablet Take 1 tablet (2.5 mg total) by mouth as needed (volume overload). 30 tablet 0   No current facility-administered medications for this visit.  Allergies:   Statins, Atorvastatin, Gabapentin, and Methocarbamol   Social History:  The patient  reports that she quit smoking about 37 years ago. Her smoking use included cigarettes. She has a 2.00 pack-year smoking history. She has been exposed to tobacco smoke. She has never used smokeless tobacco. She reports current alcohol use. She reports that she does not use drugs.   Family History:  The patient's  family history includes Breast cancer in her maternal aunt; COPD in her father; Heart attack (age of onset: 46) in her maternal grandmother; Lung cancer in her mother and son.    ROS:  Please see the history of present illness.   All other systems are reviewed and negative.    PHYSICAL EXAM: VS:  BP 120/70   Pulse (!) 47   Ht '5\' 3"'$  (1.6 m)   Wt 211 lb 3.2 oz (95.8 kg)   SpO2 93%   BMI 37.41 kg/m  , BMI Body mass index is 37.41 kg/m. GEN: Well nourished, well developed, pleasant elderly woman in no acute distress  HEENT: normal  Neck: no JVD, carotid bruits, or masses Cardiac: RRR; 3/6 harsh mid peaking crescendo decrescendo murmur at the right upper sternal border.  Trace bilateral pretibial edema Respiratory:  clear to auscultation bilaterally, normal work of  breathing GI: soft, nontender, nondistended, + BS MS: no deformity or atrophy  Skin: warm and dry  Neuro:  Strength and sensation are intact Psych: euthymic mood, full affect  EKG:  EKG is ordered today. The ekg ordered today shows sinus bradycardia 47 bpm, first-degree AV block   Recent Labs: 06/20/2021: Magnesium 2.2 06/21/2021: Hemoglobin 8.7; Platelets 376 12/01/2021: BNP 178.4; BUN 32; Creatinine, Ser 1.62; Potassium 4.6; Sodium 140    Lipid Panel     Component Value Date/Time   CHOL 126 03/14/2019 1121   TRIG 82 03/14/2019 1121   HDL 49 03/14/2019 1121   CHOLHDL 5.1 CALC 12/19/2006 0916   VLDL 26 12/19/2006 0916   LDLCALC 61 03/14/2019 1121   LDLDIRECT 159.2 12/19/2006 0916     Wt Readings from Last 3 Encounters:  03/07/22 211 lb 3.2 oz (95.8 kg)  02/17/22 208 lb 3.2 oz (94.4 kg)  01/06/22 212 lb (96.2 kg)      Other studies Reviewed: Additional studies/ records that were reviewed today include:  2D echocardiogram 02/08/2022: Narrative Echocardiogram 02/08/2022: Normal LV systolic function with visual EF 55-60%. Left ventricle cavity is normal in size. Moderate concentric hypertrophy of the left ventricle. Normal global wall motion. Indeterminate diastolic filling pattern. Calculated EF 56%. Left atrial cavity is severely dilated at 46.9 ml/m^2. Trileaflet aortic valve. Moderate aortic stenosis. Mild (Grade I) aortic regurgitation. Mild aortic valve leaflet calcification. AVA (VTI) measures 0.8 cm^2. AV Mean Grad measures 22.9 mmHg. AV Pk Vel measures 3.28 m/s. Mild (Grade I) mitral regurgitation. Mild calcification of the mitral valve annulus. Mild mitral valve leaflet thickening with mild calcification. Mildly restricted mitral valve leaflets. Structurally normal tricuspid valve with trace regurgitation. No evidence of pulmonary hypertension. Compared to 04/2021, aortic stenosis is still moderate and there is mild aortic regurgitation present.  Previously noted  moderate pulm hypertension not present.    ASSESSMENT AND PLAN:  1.  Paroxysmal atrial fibrillation I have seen HARLEAN REGULA is a 88 y.o. female in the office today who has been referred for a Watchman left atrial appendage closure device.  She has a history of paroxysmal atrial fibrillation.  This patient's atrial fibrillation-related risk scores are outlined below:  CHA2DS2-VASc Score = 6   This indicates a 9.7% annual risk of stroke. The patient's score is based upon: CHF History: 1 HTN History: 1 Diabetes History: 0 Stroke History: 0 Vascular Disease History: 1 Age Score: 2 Gender Score: 1      HAS-BLED score = 2 Hypertension No  Abnormal renal and liver function (Dialysis, transplant, Cr >2.26 mg/dL /Cirrhosis or Bilirubin >2x Normal or AST/ALT/AP >3x Normal) No  Stroke No  Bleeding Yes  Labile INR (Unstable/high INR) No  Elderly (>65) Yes  Drugs or alcohol (? 8 drinks/week, anti-plt or NSAID) No   The patient is felt to be at increased risk of recurrent bleeding and complications related to chronic oral anticoagulation.  She has a history of past gastrointestinal bleeding and has problems with gait unsteadiness and fall risk.  We discussed watchman implantation as an alternative to chronic oral anticoagulation today.  She understands that the procedure is performed with a goal of reducing bleeding risk once anticoagulant medications can be discontinued.  I reviewed the procedure in depth with her.  I demonstrated a procedural animation and discussed potential risks of the procedure.  She understands the risk of serious complication is low and in total is less than 1%.  She has concerns about general anesthesia, her advanced age, and her comorbid medical conditions.  At this point in time, she prefers to continue on her current medical program which includes low-dose apixaban 2.5 mg twice daily.  She understands that I am available if she decides to pursue watchman  implantation.  As part of her preprocedure evaluation, I would probably avoid a gated cardiac CTA because of her chronic kidney disease.  She had a transesophageal echo in 2021 and these images are reviewed today.  I think she likely has suitable left atrial appendage anatomy for watchman implantation, but I would review her previous TEE images with our structural imaging specialist to confirm this.  No medication changes are made today.  She will reach out if she decides to pursue left atrial appendage occlusion/watchman in the future.  Once these are performed I will review to confirm the patient continues to be a suitable candidate for Watchman implant. If so, we will schedule for the implant procedure at the first available date.  Current medicines are reviewed at length with the patient today.   The patient does not have concerns regarding her medicines.  The following changes were made today:  none  Labs/ tests ordered today include:  Orders Placed This Encounter  Procedures   EKG 12-Lead     Signed, Sherren Mocha, MD 03/07/2022  11:00 AM     [facility] [place] [address] [phone] (office) [phone] (fax)

## 2022-03-09 ENCOUNTER — Ambulatory Visit: Payer: Self-pay

## 2022-03-09 DIAGNOSIS — D649 Anemia, unspecified: Secondary | ICD-10-CM | POA: Diagnosis not present

## 2022-03-09 DIAGNOSIS — J069 Acute upper respiratory infection, unspecified: Secondary | ICD-10-CM | POA: Diagnosis not present

## 2022-03-09 DIAGNOSIS — R051 Acute cough: Secondary | ICD-10-CM | POA: Diagnosis not present

## 2022-03-09 DIAGNOSIS — I5032 Chronic diastolic (congestive) heart failure: Secondary | ICD-10-CM | POA: Diagnosis not present

## 2022-03-09 DIAGNOSIS — I130 Hypertensive heart and chronic kidney disease with heart failure and stage 1 through stage 4 chronic kidney disease, or unspecified chronic kidney disease: Secondary | ICD-10-CM | POA: Diagnosis not present

## 2022-03-09 DIAGNOSIS — R5383 Other fatigue: Secondary | ICD-10-CM | POA: Diagnosis not present

## 2022-03-09 DIAGNOSIS — R0609 Other forms of dyspnea: Secondary | ICD-10-CM | POA: Diagnosis not present

## 2022-03-09 DIAGNOSIS — N1832 Chronic kidney disease, stage 3b: Secondary | ICD-10-CM | POA: Diagnosis not present

## 2022-03-09 NOTE — Patient Outreach (Signed)
  Care Coordination   Follow Up Visit Note   03/09/2022 Name: BRIAHNA PESCADOR MRN: [id number] DOB: 08/11/1932  Melvyn Novas Bleicher is a 88 y.o. year old female who sees [name], MD for primary care. I reviewed patient's chart today in preparation to contact patient for a nurse care coordination follow up call.   What matters to the patients health and wellness today?  Patient is scheduled to see her PCP provider [name] NP today at 1:30 PM for evaluation of cough and dyspnea. Nurse care coordination call rescheduled due to patient schedule conflict.     Goals Addressed             This Visit's Progress    Nurse Care Coordination Activities: further follow up needed       Care Coordination Interventions: Reviewed chart in preparation to contact patient for a nurse care coordination follow up call Noted patient has upcoming appointment scheduled today with PCP [name] NP at 1:30 PM Rescheduled nurse care coordination follow up call, sent patient a my chart message with new date/time            SDOH assessments and interventions completed:  No     Care Coordination Interventions:  Yes, provided   Follow up plan: Follow up call scheduled for 03/10/22 '@10'$ :30 AM    Encounter Outcome:  Pt. Visit Completed

## 2022-03-10 ENCOUNTER — Ambulatory Visit: Payer: Self-pay

## 2022-03-10 DIAGNOSIS — H353221 Exudative age-related macular degeneration, left eye, with active choroidal neovascularization: Secondary | ICD-10-CM | POA: Diagnosis not present

## 2022-03-10 NOTE — Patient Instructions (Signed)
Visit Information  Thank you for taking time to visit with me today. Please don't hesitate to contact me if I can be of assistance to you.   Following are the goals we discussed today:   Goals Addressed               This Visit's Progress     Patient Stated     I have started a new medication for my heart failure (pt-stated)        Care Coordination Interventions: Reviewed Heart Failure Action Plan in depth and provided written copy Determined patient completed her follow up with Dr. Burt Knack and Dr. Nadyne Coombes for Watchman evaluation  Review of patient status, including review of consultant's reports, relevant laboratory and other test results, and medications completed Discussed patient noted a lower pulse rate today of 49 per her pulse oximeter Provided verbal instructions to patient on how to self monitor her radial pulse Instructed patient to report low pulse rate <50 to Dr. Nadyne Coombes promptly Determined patient feels more debilitated and unable to meet her everyday care needs Determined her son is visiting her this week but will return to [place] and then to see his dying father  Educated patient regarding SW availability, patient would like a referral to ask for resources and caregiver options Reviewed scheduled/upcoming provider appointment including: next Cardiology follow up appointment scheduled for 04/06/22 '@130'$  PM        Other     COMPLETED: Nurse Care Coordination Activities: further follow up needed        Care Coordination Interventions: Reviewed chart in preparation to contact patient for a nurse care coordination follow up call Noted patient has upcoming appointment scheduled today with PCP [name] NP at 1:30 PM Rescheduled nurse care coordination follow up call, sent patient a my chart message with new date/time            Our next appointment is by telephone on 03/18/22 at 1030 AM  Please call the care guide team at [phone] if you need to cancel or  reschedule your appointment.   If you are experiencing a Mental Health or [place] or need someone to talk to, please call 1-800-273-TALK (toll free, 24 hour hotline)  Patient verbalizes understanding of instructions and care plan provided today and agrees to view in [place]. Active MyChart status and patient understanding of how to access instructions and care plan via MyChart confirmed with patient.     Barb Merino, RN, BSN, CCM Care Management Coordinator [facility] Care Management Direct Phone: [phone]

## 2022-03-10 NOTE — Patient Outreach (Signed)
  Care Coordination   Follow Up Visit Note   03/10/2022 Name: Lisa Mcclure MRN: [id number] DOB: [birth year]  Lisa Mcclure is a 88 y.o. year old female who sees [name], MD for primary care. I spoke with  [name] by phone today.  What matters to the patients health and wellness today?  Patient would like to speak with the SW to review caregiver resources. She will continue to follow her Cardiology recommendations as directed.    Goals Addressed               This Visit's Progress     Patient Stated     I have started a new medication for my heart failure (pt-stated)        Care Coordination Interventions: Reviewed Heart Failure Action Plan in depth and provided written copy Determined patient completed her follow up with Dr. Burt Knack and Dr. Nadyne Coombes for Watchman evaluation  Review of patient status, including review of consultant's reports, relevant laboratory and other test results, and medications completed Discussed patient noted a lower pulse rate today of 49 per her pulse oximeter Provided verbal instructions to patient on how to self monitor her radial pulse Instructed patient to report low pulse rate <50 to Dr. Nadyne Coombes promptly Determined patient feels more debilitated and unable to meet her everyday care needs Determined her son is visiting her this week but will return to [place] and then to see his dying father  Educated patient regarding SW availability, patient would like a referral to ask for resources and caregiver options Reviewed scheduled/upcoming provider appointment including: next Cardiology follow up appointment scheduled for 04/06/22 '@130'$  PM        Other     COMPLETED: Nurse Care Coordination Activities: further follow up needed        Care Coordination Interventions: Reviewed chart in preparation to contact patient for a nurse care coordination follow up call Noted patient has upcoming appointment scheduled today with PCP [name] NP at 1:30 PM Rescheduled nurse care coordination follow up call, sent patient a my chart message with new date/time            SDOH assessments and interventions completed:  Yes     Care Coordination Interventions:  Yes, provided   Follow up plan: Referral made to [place] to assist patient with caregiver resources  Follow up call scheduled for 03/18/22 1030 AM    Encounter Outcome:  Pt. Visit Completed

## 2022-03-15 ENCOUNTER — Ambulatory Visit: Payer: Self-pay

## 2022-03-15 NOTE — Patient Outreach (Signed)
  Care Coordination   Follow Up Visit Note   03/15/2022 Name: Lisa Mcclure MRN: [id number] DOB: [birth year]  Lisa Mcclure is a 88 y.o. year old female who sees [name], MD for primary care. I spoke with  [name] by phone today.  What matters to the patients health and wellness today?  Caregiver resource education    Goals Addressed             This Visit's Progress    Care Coordination Activities       Care Coordination Interventions: Collaboration with RN Care Manager who indicates the patient is in need of resources for in home care Determined the patient is unable to participate in a call today Scheduled telephone call with patient on 12/7         SDOH assessments and interventions completed:  No     Care Coordination Interventions:  No, not indicated   Follow up plan: Follow up call scheduled for 12/7    Encounter Outcome:  Pt. Request to Call Back   Daneen Schick, BSW, CDP Social Worker, Certified Dementia Practitioner [place] [facility]  Care Coordination [phone]

## 2022-03-16 DIAGNOSIS — I10 Essential (primary) hypertension: Secondary | ICD-10-CM | POA: Diagnosis not present

## 2022-03-17 ENCOUNTER — Ambulatory Visit: Payer: Self-pay

## 2022-03-17 NOTE — Patient Instructions (Signed)
Visit Information  Thank you for taking time to visit with me today. Please don't hesitate to contact me if I can be of assistance to you.   Following are the goals we discussed today:   Goals Addressed             This Visit's Progress    Care Coordination Activities       Care Coordination Interventions: Discussed the patient would like to hire in home assistance for when her current caregiver is unavailable. She is currently working with her daughter to determine if an agency or private caregiver is more desired Reviewed the patient does have a caregiver who is a wonderful help but the patient would like alternative options for when this caregiver is not in town Determined the patient is able to privately pay for assistance  Mailed the patient a list of agencies that provide in home assistance          Our next appointment is by telephone on 12/21 at 2:30  Please call the care guide team at [phone] if you need to cancel or reschedule your appointment.   If you are experiencing a Mental Health or [place] or need someone to talk to, please call 1-800-273-TALK (toll free, 24 hour hotline)  Patient verbalizes understanding of instructions and care plan provided today and agrees to view in [place]. Active MyChart status and patient understanding of how to access instructions and care plan via MyChart confirmed with patient.     Telephone follow up appointment with care management team member scheduled for:12/21  Daneen Schick, Arita Miss, CDP Social Worker, Certified Dementia Practitioner [place] [facility]  Care Coordination [phone]

## 2022-03-17 NOTE — Patient Outreach (Signed)
  Care Coordination   Follow Up Visit Note   03/17/2022 Name: Lisa Mcclure MRN: [id number] DOB: [birth year]  Lisa Mcclure is a 88 y.o. year old female who sees [name], MD for primary care. I spoke with  [name] by phone today.  What matters to the patients health and wellness today?  Caregiver resources    Goals Addressed             This Visit's Progress    Care Coordination Activities       Care Coordination Interventions: Discussed the patient would like to hire in home assistance for when her current caregiver is unavailable. She is currently working with her daughter to determine if an agency or private caregiver is more desired Reviewed the patient does have a caregiver who is a wonderful help but the patient would like alternative options for when this caregiver is not in town Determined the patient is able to privately pay for assistance  Mailed the patient a list of agencies that provide in home assistance          SDOH assessments and interventions completed:  No     Care Coordination Interventions:  Yes, provided   Follow up plan: Follow up call scheduled for 12/21    Encounter Outcome:  Pt. Visit Completed   Daneen Schick, Arita Miss, CDP Social Worker, Certified Dementia Practitioner [place] [facility]  Care Coordination [phone]

## 2022-03-18 ENCOUNTER — Ambulatory Visit: Payer: Self-pay

## 2022-03-18 DIAGNOSIS — R0781 Pleurodynia: Secondary | ICD-10-CM | POA: Diagnosis not present

## 2022-03-18 DIAGNOSIS — R0609 Other forms of dyspnea: Secondary | ICD-10-CM | POA: Diagnosis not present

## 2022-03-18 DIAGNOSIS — I130 Hypertensive heart and chronic kidney disease with heart failure and stage 1 through stage 4 chronic kidney disease, or unspecified chronic kidney disease: Secondary | ICD-10-CM | POA: Diagnosis not present

## 2022-03-18 DIAGNOSIS — N1832 Chronic kidney disease, stage 3b: Secondary | ICD-10-CM | POA: Diagnosis not present

## 2022-03-18 DIAGNOSIS — D649 Anemia, unspecified: Secondary | ICD-10-CM | POA: Diagnosis not present

## 2022-03-18 DIAGNOSIS — I5032 Chronic diastolic (congestive) heart failure: Secondary | ICD-10-CM | POA: Diagnosis not present

## 2022-03-18 DIAGNOSIS — R5383 Other fatigue: Secondary | ICD-10-CM | POA: Diagnosis not present

## 2022-03-18 NOTE — Patient Outreach (Signed)
  Care Coordination   Follow Up Visit Note   03/18/2022 Name: Lisa Mcclure MRN: [id number] DOB: 02/08/1933  Lisa Mcclure is a 88 y.o. year old female who sees Lisa Lopes, MD for primary care. I spoke with  Lisa Mcclure by phone today.  What matters to the patients health and wellness today?  Patient will f/u with her PCP today for evaluation of low heart rate, chest congestion and acute episode of diarrhea.     Goals Addressed               This Visit's Progress     Patient Stated     I have started a new medication for my heart failure (pt-stated)        Care Coordination Interventions: Reviewed Heart Failure Action Plan in depth and provided written copy Determined patient has a follow up appointment scheduled with PCP this afternoon, Lisa Beaver NP for evaluation of decreased heart rate, acute diarrhea with abdominal pain and chest congestion  Determined patient has transportation to her appointment this afternoon  Scheduled a follow up call with patient following next Cardiac follow up with Lisa Mcclure        I may need to see my lung doctor (pt-stated)        Care Coordination Interventions: Provided patient with basic written and verbal COPD education on self care/management/and exacerbation prevention Provided instruction about proper use of medications used for management of COPD including inhalers Discussed the importance of adequate rest and management of fatigue with COPD Encouraged patient to discuss symptoms with her PCP during her follow up scheduled for today           SDOH assessments and interventions completed:  No     Care Coordination Interventions:  Yes, provided   Follow up plan: Follow up call scheduled for 04/12/22 '@0900AM'$     Encounter Outcome:  Pt. Visit Completed

## 2022-03-18 NOTE — Patient Instructions (Signed)
Visit Information  Thank you for taking time to visit with me today. Please don't hesitate to contact me if I can be of assistance to you.   Following are the goals we discussed today:   Goals Addressed               This Visit's Progress     Patient Stated     I have started a new medication for my heart failure (pt-stated)        Care Coordination Interventions: Reviewed Heart Failure Action Plan in depth and provided written copy Determined patient has a follow up appointment scheduled with PCP this afternoon, [name] NP for evaluation of decreased heart rate, acute diarrhea with abdominal pain and chest congestion  Determined patient has transportation to her appointment this afternoon  Scheduled a follow up call with patient following next Cardiac follow up with Dr. Einar Gip        I may need to see my lung doctor (pt-stated)        Care Coordination Interventions: Provided patient with basic written and verbal COPD education on self care/management/and exacerbation prevention Provided instruction about proper use of medications used for management of COPD including inhalers Discussed the importance of adequate rest and management of fatigue with COPD Encouraged patient to discuss symptoms with her PCP during her follow up scheduled for today           Our next appointment is by telephone on 04/12/22 at 0900 AM  Please call the care guide team at [phone] if you need to cancel or reschedule your appointment.   If you are experiencing a Mental Health or [place] or need someone to talk to, please call 1-800-273-TALK (toll free, 24 hour hotline)  Patient verbalizes understanding of instructions and care plan provided today and agrees to view in [place]. Active MyChart status and patient understanding of how to access instructions and care plan via MyChart confirmed with patient.     Barb Merino, RN, BSN, CCM Care Management Coordinator [facility] Care  Management Direct Phone: [phone]

## 2022-03-21 DIAGNOSIS — H401121 Primary open-angle glaucoma, left eye, mild stage: Secondary | ICD-10-CM | POA: Diagnosis not present

## 2022-03-21 DIAGNOSIS — H401112 Primary open-angle glaucoma, right eye, moderate stage: Secondary | ICD-10-CM | POA: Diagnosis not present

## 2022-03-21 DIAGNOSIS — H52203 Unspecified astigmatism, bilateral: Secondary | ICD-10-CM | POA: Diagnosis not present

## 2022-03-21 DIAGNOSIS — Z961 Presence of intraocular lens: Secondary | ICD-10-CM | POA: Diagnosis not present

## 2022-03-22 ENCOUNTER — Encounter: Payer: Self-pay | Admitting: Primary Care

## 2022-03-22 ENCOUNTER — Ambulatory Visit (INDEPENDENT_AMBULATORY_CARE_PROVIDER_SITE_OTHER): Payer: Medicare Other | Admitting: Primary Care

## 2022-03-22 VITALS — BP 120/76 | HR 56 | Temp 98.2°F | Ht 63.0 in | Wt 210.0 lb

## 2022-03-22 DIAGNOSIS — R053 Chronic cough: Secondary | ICD-10-CM

## 2022-03-22 DIAGNOSIS — G4733 Obstructive sleep apnea (adult) (pediatric): Secondary | ICD-10-CM

## 2022-03-22 DIAGNOSIS — K219 Gastro-esophageal reflux disease without esophagitis: Secondary | ICD-10-CM | POA: Diagnosis not present

## 2022-03-22 MED ORDER — BUDESONIDE 0.5 MG/2ML IN SUSP: 0.5000 mg | Freq: Two times a day (BID) | RESPIRATORY_TRACT | 1 refills | Status: AC

## 2022-03-22 MED ORDER — IPRATROPIUM BROMIDE 0.03 % NA SOLN: 2.0000 | Freq: Two times a day (BID) | NASAL | 1 refills | Status: DC

## 2022-03-22 NOTE — Progress Notes (Signed)
$'@Patient'X$  ID: Lisa Mcclure, female    DOB: February 17, 1933, 88 y.o.   MRN: [id number]  Chief Complaint  Patient presents with   Follow-up    Moderate persistent asthma  02 3l at night  Cough since August  Breathing has not been good DOE.    Referring provider: [name], MD  HPI: 88 year old female, former smoker. PMH significant for OSA intolerant to CPAP, COPD, BOOP, afib, HTN, chronic heart failure, CKD.   Previous [facility] pulmonary encounter: Lisa Mcclure is a 88 y.o. female former smoker with chronic respiratory failure and atypical pneumonia.  She had BOOP in the setting of rheumatoid arthritis in 2010.  She has history of obstructive sleep apnea, but is intolerant of CPAP.  12/01/21- Dr. Halford Chessman She is here with her aide.  Chest xray today looks better.  She has tried albuterol, but having trouble coordinating when to push and when to inhale.  She has a spacer device.  She is working with PT at home.  When walking at home her SpO2 stays above 94% on room air.  No using oxygen during the day anymore.  Still using 2 liters at night.  Has cough with thick, white sputum (looks like Elmer's glue).   03/22/2022 - Interim hx  Patient presents today for OV d/t cough. She has dyspnea symptoms and productive cough. It was recommended she follow-up with pulmonary. She reports having rib pain from fall 2-3 weeks ago. CXR 11/29 without acute process, cardiomegaly with signs of vascular congestion. No improvement with diuretics. She was given steroid injection. Maintained on pulmicort nebulizer 0.'25mg'$ /13m twice daily and maintained on Albuterol q6 hours.  Allergies  Allergen Reactions   Statins Other (See Comments)    Muscle aches and INTERNAL BLEEDING   Atorvastatin     Other reaction(s): Myalgias   Gabapentin Other (See Comments)    "Made me loopy"   Methocarbamol Other (See Comments)    "Made me loopy"    Immunization History  Administered Date(s) Administered    Influenza Inj Mdck Quad Pf 01/09/2016, 12/16/2016   Influenza Split 12/04/2009, 01/20/2011, 12/26/2011, 12/24/2012, 02/20/2014, 02/08/2016   Influenza Whole 01/09/2010   Influenza, High Dose Seasonal PF 02/08/2016   Influenza, Quadrivalent, Recombinant, Inj, Pf 01/13/2018, 12/06/2018, 02/08/2020, 01/26/2021   Influenza,inj,Quad PF,6+ Mos 02/20/2014, 02/13/2015   Influenza-Unspecified 02/08/2016   PFIZER(Purple Top)SARS-COV-2 Vaccination 05/18/2019, 06/12/2019   PPD Test 03/10/2015   Pneumococcal Polysaccharide-23 02/03/2011, 04/04/2015   Td 11/21/2011, 12/24/2012   Tdap 09/08/2020   Zoster Recombinat (Shingrix) 09/08/2017, 01/29/2018   Zoster, Live 09/08/2017, 01/29/2018    Past Medical History:  Diagnosis Date   Anemia    h/o hemorrhoidal bleeding and blood transfusion   Chronic diastolic CHF (congestive heart failure) (HCC)    Depression    Diverticulosis    DOE (dyspnea on exertion)    Esophageal reflux    GERD (gastroesophageal reflux disease)    Hypothyroidism    Insomnia    LBP (low back pain)    OAB (overactive bladder)    Obesity    OSA (obstructive sleep apnea)    Osteoarthritis    Paroxysmal atrial fibrillation (HCC)    Rheumatoid arthritis(714.0)    Shoulder pain, bilateral    Unspecified essential hypertension    Venous insufficiency     Tobacco History: Social History   Tobacco Use  Smoking Status Former   Packs/day: 0.20   Years: 10.00   Total pack years: 2.00   Types: Cigarettes  Quit date: 04/11/1984   Years since quitting: 37.9   Passive exposure: Past  Smokeless Tobacco Never   Counseling given: Not Answered   Outpatient Medications Prior to Visit  Medication Sig Dispense Refill   albuterol (PROVENTIL) (2.5 MG/3ML) 0.083% nebulizer solution Take 3 mLs (2.5 mg total) by nebulization every 6 (six) hours as needed for wheezing or shortness of breath. 75 mL 12   albuterol (VENTOLIN HFA) 108 (90 Base) MCG/ACT inhaler Inhale 2 puffs into the  lungs every 6 (six) hours as needed for wheezing or shortness of breath. 8 g 2   amiodarone (PACERONE) 100 MG tablet TAKE TWO TABLETS BY MOUTH EVERY MORNING 60 tablet 5   apixaban (ELIQUIS) 2.5 MG TABS tablet Take 1 tablet (2.5 mg total) by mouth 2 (two) times daily. 180 tablet 3   atenolol (TENORMIN) 50 MG tablet Take 1 tablet (50 mg total) by mouth daily. 30 tablet 2   busPIRone (BUSPAR) 7.5 MG tablet Take 7.5 mg by mouth 2 (two) times daily.     dapagliflozin propanediol (FARXIGA) 10 MG TABS tablet Take 1 tablet (10 mg total) by mouth daily before breakfast. 30 tablet 6   diclofenac sodium (VOLTAREN) 1 % GEL Apply 2.25 g topically 3 (three) times daily as needed (for pain).      furosemide (LASIX) 20 MG tablet Take 1 tablet (20 mg total) by mouth daily as needed for fluid. 90 tablet 3   HYDROcodone-acetaminophen (NORCO) 7.5-325 MG tablet Take 1 tablet by mouth every 6 (six) hours as needed for moderate pain. 20 tablet 0   latanoprost (XALATAN) 0.005 % ophthalmic solution Place 1 drop into both eyes at bedtime.     levothyroxine (SYNTHROID) 125 MCG tablet Take 125 mcg by mouth daily before breakfast.      losartan (COZAAR) 100 MG tablet Take 1 tablet (100 mg total) by mouth daily. TAKE ONE TABLET BY MOUTH AT NOON Strength: 100 mg 90 tablet 3   rOPINIRole (REQUIP) 1 MG tablet Take 1 mg by mouth See admin instructions. Take 1 mg by mouth once a day between 3 PM-4 PM and an additional 1 mg in the evening, if no relief  12   budesonide (PULMICORT) 0.25 MG/2ML nebulizer solution Take 2 mLs (0.25 mg total) by nebulization 2 (two) times daily. 60 mL 12   ferrous sulfate 325 (65 FE) MG tablet Take 1 tablet (325 mg total) by mouth daily with breakfast. 90 tablet 0   metolazone (ZAROXOLYN) 2.5 MG tablet Take 1 tablet (2.5 mg total) by mouth as needed (volume overload). 30 tablet 0   No facility-administered medications prior to visit.      Review of Systems  Review of Systems   Physical Exam  BP  120/76 (BP Location: Right Leg, Patient Position: Sitting, Cuff Size: Normal)   Pulse (!) 56   Temp 98.2 F (36.8 C) (Oral)   Ht '5\' 3"'$  (1.6 m)   Wt 210 lb (95.3 kg)   SpO2 93%   BMI 37.20 kg/m  Physical Exam   Lab Results:  CBC    Component Value Date/Time   WBC 7.6 06/21/2021 0447   RBC 3.25 (L) 06/21/2021 0447   HGB 8.7 (L) 06/21/2021 0447   HGB 11.8 08/04/2020 1116   HCT 29.4 (L) 06/21/2021 0447   HCT 36.2 08/04/2020 1116   PLT 376 06/21/2021 0447   PLT 393 08/04/2020 1116   MCV 90.5 06/21/2021 0447   MCV 84 08/04/2020 1116   MCH 26.8 06/21/2021  0447   MCHC 29.6 (L) 06/21/2021 0447   RDW 17.8 (H) 06/21/2021 0447   RDW 16.1 (H) 08/04/2020 1116   LYMPHSABS 0.8 06/15/2021 1712   MONOABS 0.9 06/15/2021 1712   EOSABS 0.2 06/15/2021 1712   BASOSABS 0.1 06/15/2021 1712    BMET    Component Value Date/Time   NA 140 12/01/2021 1518   K 4.6 12/01/2021 1518   CL 97 12/01/2021 1518   CO2 25 12/01/2021 1518   GLUCOSE 100 (H) 12/01/2021 1518   GLUCOSE 100 (H) 06/20/2021 0536   BUN 32 (H) 12/01/2021 1518   CREATININE 1.62 (H) 12/01/2021 1518   CREATININE 0.98 (H) 01/29/2016 1528   CALCIUM 8.9 12/01/2021 1518   GFRNONAA 41 (L) 06/20/2021 0536   GFRAA 41 (L) 04/30/2020 1200    BNP    Component Value Date/Time   BNP 178.4 (H) 12/01/2021 1518   BNP 353.0 (H) 06/21/2021 0447   BNP 96.8 01/22/2016 1108    ProBNP    Component Value Date/Time   PROBNP 24.0 12/19/2006 0916    Imaging: No results found.   Assessment & Plan:   Chronic cough - Felt to be from a combination of PND, reflux and reactive airway from recent PNA - Increase pulmicort 0.5 mg BID - Cont flonase; Start Atrovent nasal spray- use 2 sprays per nostril morning and evening  -- Start mucinex '600mg'$  twice daily until follow-up -- Use the flutter valve three times a day until follow-up  -- If not better we will get CT chest   Follow-up: -- 3-4 weeks with Eustaquio Maize NP/ virtual ok   OSA (obstructive  sleep apnea) - Intolerant to PAP therapy - Continue 2l oxygen at bedtime  Esophageal reflux - Cont Protonix     Martyn Ehrich, NP 03/28/2022

## 2022-03-22 NOTE — Patient Instructions (Addendum)
Recommendations: -- Increase Budesonide 0.'5mg'$  twice daily  -- Use Albuterol 2 puffs every 4-6 hours for shortness of breath  -- Start Atrovent nasal spray- use 2 sprays per nostril morning and evening  -- Start mucinex '600mg'$  twice daily until follow-up -- Use the flutter valve three times a day until follow-up  -- If not better we will get CT chest   Follow-up: -- 3-4 weeks with Eustaquio Maize NP/ virtual ok

## 2022-03-28 DIAGNOSIS — R053 Chronic cough: Secondary | ICD-10-CM | POA: Insufficient documentation

## 2022-03-28 NOTE — Assessment & Plan Note (Addendum)
-   Felt to be from a combination of PND, reflux and reactive airway from recent PNA - Increase pulmicort 0.5 mg BID - Cont flonase; Start Atrovent nasal spray- use 2 sprays per nostril morning and evening  -- Start mucinex '600mg'$  twice daily until follow-up -- Use the flutter valve three times a day until follow-up  -- If not better we will get CT chest   Follow-up: -- 3-4 weeks with Eustaquio Maize NP/ virtual ok

## 2022-03-28 NOTE — Assessment & Plan Note (Signed)
-   Intolerant to PAP therapy - Continue 2l oxygen at bedtime

## 2022-03-28 NOTE — Assessment & Plan Note (Signed)
-   Cont Protonix.  

## 2022-03-31 ENCOUNTER — Telehealth: Payer: Self-pay

## 2022-03-31 NOTE — Patient Outreach (Signed)
  Care Coordination   03/31/2022 Name: Lisa Mcclure MRN: [id number] DOB: Nov 13, 1932   Care Coordination Outreach Attempts:  An unsuccessful telephone outreach was attempted today to offer the patient information about available care coordination services as a benefit of their health plan.   Follow Up Plan:  Additional outreach attempts will be made to offer the patient care coordination information and services.   Encounter Outcome:  No Answer   Care Coordination Interventions:  No, not indicated    Daneen Schick, BSW, CDP Social Worker, Certified Dementia Practitioner [place] [facility]  Care Coordination [phone]

## 2022-04-06 ENCOUNTER — Ambulatory Visit: Payer: Medicare Other | Admitting: Cardiology

## 2022-04-07 DIAGNOSIS — H35033 Hypertensive retinopathy, bilateral: Secondary | ICD-10-CM | POA: Diagnosis not present

## 2022-04-07 DIAGNOSIS — H353123 Nonexudative age-related macular degeneration, left eye, advanced atrophic without subfoveal involvement: Secondary | ICD-10-CM | POA: Diagnosis not present

## 2022-04-07 DIAGNOSIS — H43813 Vitreous degeneration, bilateral: Secondary | ICD-10-CM | POA: Diagnosis not present

## 2022-04-07 DIAGNOSIS — H353114 Nonexudative age-related macular degeneration, right eye, advanced atrophic with subfoveal involvement: Secondary | ICD-10-CM | POA: Diagnosis not present

## 2022-04-07 DIAGNOSIS — H353221 Exudative age-related macular degeneration, left eye, with active choroidal neovascularization: Secondary | ICD-10-CM | POA: Diagnosis not present

## 2022-04-08 ENCOUNTER — Telehealth: Payer: Self-pay

## 2022-04-08 NOTE — Telephone Encounter (Signed)
Patient's weight has been trending upwards despite patient taking '40mg'$  Lasix the past three days.  Per Dr. Einar Gip - patient to increase furosemide to '40mg'$  BID for 3 days only.  Patient has office visit on 04/14/22.  04/08/2022 Friday at 06:08 AM 216.6      04/07/2022 Thursday at 08:29 AM 214.2      04/06/2022 Wednesday at 04:59 AM 212.6      04/05/2022 Tuesday at 09:26 AM 215.6      04/04/2022 Monday at 07:45 AM 214.6      04/03/2022 'Sunday at 01:57 PM 212.4      03/31/2022 Thursday at 11:54 AM 211.4      03/29/2022 Tuesday at 06:20 AM 211      03/28/2022 Monday at 06:08 AM 210.6      12'$ /15/2023 Friday at 06:26 AM 206.8

## 2022-04-12 ENCOUNTER — Ambulatory Visit: Payer: Self-pay

## 2022-04-12 ENCOUNTER — Other Ambulatory Visit: Payer: Self-pay

## 2022-04-12 DIAGNOSIS — I5032 Chronic diastolic (congestive) heart failure: Secondary | ICD-10-CM

## 2022-04-12 NOTE — Progress Notes (Signed)
F/u labs for OV on 04/14/21

## 2022-04-12 NOTE — Patient Instructions (Addendum)
Visit Information  Thank you for taking time to visit with me today. Please don't hesitate to contact me if I can be of assistance to you.   Following are the goals we discussed today:   Goals Addressed               This Visit's Progress     Patient Stated     I have started a new medication for my heart failure (pt-stated)        Care Coordination Interventions: Evaluation of current treatment plan related to CHF and patient's adherence to plan as established by provider Determined patient is experiencing increased weight gain and worsening edema to her legs bilaterally, she is experiencing shortness of breath  Discussed patient reported symptoms to Dr. Einar Gip, she is following the recommendations  Reviewed medications with patient and discussed importance of medication adherence Reviewed and discussed with patient regarding ongoing CHF symptom management and when to call the doctor and or 911 Educated patient regarding the use of deep breathing exercises to help control breathing  Educated patient on the importance of pacing her activities with plenty of rest while experiencing worsening symptoms  Educated patient regarding the importance to stay well hydrated not to go over recommendations for fluid intake  Instructed patient to discuss recommendations for fluid intake/restrictions with Dr. Einar Gip at next scheduled visit  Educated patient regarding the importance of limiting sodium intake  Discussed and reviewed with patient upcoming scheduled follow up appointment with Cardiologist, Dr. Einar Gip set for 04/14/22 '@1'$ :15 PM, confirmed patient has transportation scheduled via [place] next appointment is by telephone on 04/15/22 at 2:30 PM  Please call the care guide team at [phone] if you need to cancel or reschedule your appointment.   If you are experiencing a Mental Health or [place] or need someone to talk to, please call [phone]-TALK  (toll free, 24 hour hotline)  Patient verbalizes understanding of instructions and care plan provided today and agrees to view in [place]. Active MyChart status and patient understanding of how to access instructions and care plan via MyChart confirmed with patient.     Barb Merino, RN, BSN, CCM Care Management Coordinator [facility] Care Management Direct Phone: [phone]

## 2022-04-12 NOTE — Patient Outreach (Signed)
  Care Coordination   Follow Up Visit Note   04/12/2022 Name: Lisa Mcclure MRN: [id number] DOB: July 29, 1932  Lisa Mcclure is a 89 y.o. year old female who sees Lisa Lopes, MD for primary care. I spoke with  Lisa Mcclure by phone today.  What matters to the patients health and wellness today?  Patient is experiencing symptoms of CHF.     Goals Addressed               This Visit's Progress     Patient Stated     I have started a new medication for my heart failure (pt-stated)        Care Coordination Interventions: Evaluation of current treatment plan related to CHF and patient's adherence to plan as established by provider Determined patient is experiencing increased weight gain and worsening edema to her legs bilaterally, she is experiencing shortness of breath  Discussed patient reported symptoms to Dr. Einar Mcclure, she is following the recommendations  Reviewed medications with patient and discussed importance of medication adherence Reviewed and discussed with patient regarding ongoing CHF symptom management and when to call the doctor and or 911 Educated patient regarding the use of deep breathing exercises to help control breathing  Educated patient on the importance of pacing her activities with plenty of rest while experiencing worsening symptoms  Educated patient regarding the importance to stay well hydrated not to go over recommendations for fluid intake  Instructed patient to discuss recommendations for fluid intake/restrictions with Dr. Einar Mcclure at next scheduled visit  Educated patient regarding the importance of limiting sodium intake  Discussed and reviewed with patient upcoming scheduled follow up appointment with Cardiologist, Dr. Einar Mcclure set for 04/14/22 '@1'$ :15 PM, confirmed patient has transportation scheduled via Lisa Mcclure assessments and interventions completed:  No     Care Coordination Interventions:  Yes, provided    Follow up plan: Follow up call scheduled for 04/15/22 '@2'$ :30 PM    Encounter Outcome:  Pt. Visit Completed

## 2022-04-13 DIAGNOSIS — I5032 Chronic diastolic (congestive) heart failure: Secondary | ICD-10-CM | POA: Diagnosis not present

## 2022-04-14 ENCOUNTER — Ambulatory Visit: Payer: Medicare Other | Admitting: Cardiology

## 2022-04-14 ENCOUNTER — Telehealth: Payer: Self-pay

## 2022-04-14 LAB — BASIC METABOLIC PANEL
BUN/Creatinine Ratio: 23 (ref 12–28)
BUN: 24 mg/dL (ref 8–27)
CO2: 30 mmol/L — ABNORMAL HIGH (ref 20–29)
Calcium: 8.9 mg/dL (ref 8.7–10.3)
Chloride: 98 mmol/L (ref 96–106)
Creatinine, Ser: 1.04 mg/dL — ABNORMAL HIGH (ref 0.57–1.00)
Glucose: 94 mg/dL (ref 70–99)
Potassium: 4.2 mmol/L (ref 3.5–5.2)
Sodium: 142 mmol/L (ref 134–144)
eGFR: 52 mL/min/{1.73_m2} — ABNORMAL LOW (ref >59)

## 2022-04-14 NOTE — Telephone Encounter (Signed)
Patient's home weight has been relatively stable up until the holidays. Weight has been increase since 12/24. Last Friday increased patient's diuretic to Lasix '40mg'$  BID for 3 days only. Patient weight remains elevated.  Average Weight Level 210.41 lbs Lowest Weight Level 203.6 lbs Highest Weight Level 216.8 lbs  04/14/2022 Thursday at 07:48 AM 216.8      04/12/2022 Tuesday at 04:39 AM 212.8      04/10/2022 'Sunday at 09:26 AM 214.4      04/08/2022 Friday at 06:08 AM 216.6      04/07/2022 Thursday at 08:29 AM 214.2      04/06/2022 Wednesday at 04:59 AM 212.6      04/05/2022 Tuesday at 09:26 AM 215.6      04/04/2022 Monday at 07:45 AM 214.6      04/03/2022 Sunday at 01:57 PM 212.4      03/31/2022 Thursday at 11:54 AM 211.4      03/29/2022 Tuesday at 06:20 AM 211      03/28/2022 Monday at 06:08 AM 210.6      12'$ /15/2023 Friday at 06:26 AM 206.8

## 2022-04-15 ENCOUNTER — Ambulatory Visit: Payer: Self-pay

## 2022-04-15 ENCOUNTER — Other Ambulatory Visit: Payer: Self-pay | Admitting: Cardiology

## 2022-04-15 DIAGNOSIS — I5032 Chronic diastolic (congestive) heart failure: Secondary | ICD-10-CM

## 2022-04-15 DIAGNOSIS — I5033 Acute on chronic diastolic (congestive) heart failure: Secondary | ICD-10-CM

## 2022-04-15 LAB — BRAIN NATRIURETIC PEPTIDE: BNP: 486.9 pg/mL — ABNORMAL HIGH (ref 0.0–100.0)

## 2022-04-15 MED ORDER — METOLAZONE 2.5 MG PO TABS: 2.5000 mg | ORAL_TABLET | ORAL | 0 refills | Status: DC | PRN

## 2022-04-15 MED ORDER — FUROSEMIDE 20 MG PO TABS: 40.0000 mg | ORAL_TABLET | Freq: Every day | ORAL | 0 refills | Status: DC | PRN

## 2022-04-15 NOTE — Patient Instructions (Signed)
Visit Information  Thank you for taking time to visit with me today. Please don't hesitate to contact me if I can be of assistance to you.   Following are the goals we discussed today:   Goals Addressed               This Visit's Progress     Patient Stated     I have started a new medication for my heart failure (pt-stated)        Care Coordination Interventions: Evaluation of current treatment plan related to CHF and patient's adherence to plan as established by provider Determined patient was unable to keep her in person visit with Dr. Einar Gip as scheduled this week due to experiencing urinary incontinence while traveling to the appointment, she returned home and rescheduled her appointment  Reviewed and discussed medication recommendations provided by Dr. Einar Gip including the following;  metolazone (ZAROXOLYN) 2.5 MG tablet       Sig: Take 1 tablet (2.5 mg total) by mouth as needed (volume overload).      Dispense:  30 tablet      Refill:  0   furosemide (LASIX) 20 MG tablet      Sig: Take 2 tablets (40 mg total) by mouth daily as needed for fluid.      Dispense:  90 tablet      Refill:  0      This prescription was filled on 11/08/2021. Any refills authorized will be placed on file.  Determined patient verbalizes understanding of her prescribed medications and is adhering as directed Encouraged patient to continue to monitor her weights daily and record, adhering to her CHF home management plan as directed  Reviewed upcoming follow up with Dr. Einar Gip for in person visit scheduled for 04/20/22 '@3'$ :15 PM         Our next appointment is by telephone on 06/21/22 at 3:15 PM  Please call the care guide team at [phone] if you need to cancel or reschedule your appointment.   If you are experiencing a Mental Health or [place] or need someone to talk to, please go to [facility] [address], [place] [phone])  Patient  verbalizes understanding of instructions and care plan provided today and agrees to view in [place]. Active MyChart status and patient understanding of how to access instructions and care plan via MyChart confirmed with patient.     Barb Merino, RN, BSN, CCM Care Management Coordinator [facility] Care Management  Direct Phone: [phone]

## 2022-04-15 NOTE — Progress Notes (Unsigned)
ICD-10-CM   1. Acute on chronic diastolic congestive heart failure (HCC)  I50.33 metolazone (ZAROXOLYN) 2.5 MG tablet    2. Chronic diastolic (congestive) heart failure (HCC)  I50.32 furosemide (LASIX) 20 MG tablet     Meds ordered this encounter  Medications   metolazone (ZAROXOLYN) 2.5 MG tablet    Sig: Take 1 tablet (2.5 mg total) by mouth as needed (volume overload).    Dispense:  30 tablet    Refill:  0   furosemide (LASIX) 20 MG tablet    Sig: Take 2 tablets (40 mg total) by mouth daily as needed for fluid.    Dispense:  90 tablet    Refill:  0    This prescription was filled on 11/08/2021. Any refills authorized will be placed on file.

## 2022-04-15 NOTE — Patient Outreach (Signed)
  Care Coordination   Follow Up Visit Note   04/15/2022 Name: VINIE CHARITY MRN: [id number] DOB: Aug 17, 1932  Melvyn Novas Memon is a 89 y.o. year old female who sees [name], MD for primary care. I spoke with  [name] by phone today.  What matters to the patients health and wellness today?  Patient continues to experience increased weight gain and edema secondary to CHF.     Goals Addressed               This Visit's Progress     Patient Stated     I have started a new medication for my heart failure (pt-stated)        Care Coordination Interventions: Evaluation of current treatment plan related to CHF and patient's adherence to plan as established by provider Determined patient was unable to keep her in person visit with Dr. Einar Gip as scheduled this week due to experiencing urinary incontinence while traveling to the appointment, she returned home and rescheduled her appointment  Reviewed and discussed medication recommendations provided by Dr. Einar Gip including the following;  metolazone (ZAROXOLYN) 2.5 MG tablet       Sig: Take 1 tablet (2.5 mg total) by mouth as needed (volume overload).      Dispense:  30 tablet      Refill:  0   furosemide (LASIX) 20 MG tablet      Sig: Take 2 tablets (40 mg total) by mouth daily as needed for fluid.      Dispense:  90 tablet      Refill:  0      This prescription was filled on 11/08/2021. Any refills authorized will be placed on file.  Determined patient verbalizes understanding of her prescribed medications and is adhering as directed Encouraged patient to continue to monitor her weights daily and record, adhering to her CHF home management plan as directed  Reviewed upcoming follow up with Dr. Einar Gip for in person visit scheduled for 04/20/22 '@3'$ :15 PM         SDOH assessments and interventions completed:  No     Care Coordination Interventions:  Yes, provided   Follow up plan: Follow up call scheduled for  04/22/22 '@2'$ :30 PM     Encounter Outcome:  Pt. Visit Completed

## 2022-04-16 DIAGNOSIS — I10 Essential (primary) hypertension: Secondary | ICD-10-CM | POA: Diagnosis not present

## 2022-04-16 DIAGNOSIS — I5032 Chronic diastolic (congestive) heart failure: Secondary | ICD-10-CM | POA: Diagnosis not present

## 2022-04-19 ENCOUNTER — Telehealth: Payer: Self-pay

## 2022-04-19 NOTE — Patient Outreach (Signed)
  Care Coordination   Follow Up Visit Note   04/19/2022 Name: Lisa Mcclure MRN: [id number] DOB: Sep 01, 1932  Lisa Mcclure is a 89 y.o. year old female who sees [name], MD for primary care. I  was unable to engage patient during scheduled call. Patient initially answered and requested SW call back in 15 minutes. When SW called back, patient did not answer.  What matters to the patients health and wellness today?  Unable to speak with patient    Goals Addressed             This Visit's Progress    COMPLETED: Care Coordination Activities       Care Coordination Interventions: Unable to confirm resources received after multiple attempts Collaboration with [place] who is scheduled to call patient on 1/12 - requested RN CM notify SW if continued outreach is needed           SDOH assessments and interventions completed:  No     Care Coordination Interventions:  Yes, provided   Follow up plan:  SW will sign off at this time with option to re-engage if needed. RN Care Manager to remain engaged with the patient.    Encounter Outcome:  Pt. Request to Call Back   Daneen Schick, BSW, CDP Social Worker, Certified Dementia Practitioner [facility]  Care Coordination [phone]

## 2022-04-20 ENCOUNTER — Encounter (HOSPITAL_COMMUNITY): Payer: Self-pay

## 2022-04-20 ENCOUNTER — Ambulatory Visit: Payer: Medicare Other | Admitting: Cardiology

## 2022-04-20 ENCOUNTER — Encounter: Payer: Self-pay | Admitting: Cardiology

## 2022-04-20 VITALS — BP 147/92 | HR 87 | Ht 63.0 in | Wt 214.0 lb

## 2022-04-20 DIAGNOSIS — I5033 Acute on chronic diastolic (congestive) heart failure: Secondary | ICD-10-CM | POA: Diagnosis not present

## 2022-04-20 DIAGNOSIS — I480 Paroxysmal atrial fibrillation: Secondary | ICD-10-CM | POA: Diagnosis not present

## 2022-04-20 DIAGNOSIS — I10 Essential (primary) hypertension: Secondary | ICD-10-CM | POA: Diagnosis not present

## 2022-04-20 NOTE — Telephone Encounter (Signed)
Updated weight since last entry due to cancelled appointment on 04/14/22.  Patient weight has returned to baseline. Over the weekend patient was to take metolazone 2.'5mg'$  daily for 4 days only per Friday's note.  04/20/2022 Wednesday at 06:52 AM 208.4      04/19/2022 Tuesday at 09:19 AM 207.6      04/18/2022 Monday at 08:17 AM 210.2      04/17/2022 'Sunday at 05:04 AM 210      04/16/2022 Saturday at 05:54 AM 214.2      04/15/2022 Friday at 05:51 AM 215.8      04/14/2022 Thursday at 07:48 AM 216.8      04/12/2022 Tuesday at 04:39 AM 212.8      04/10/2022 Sunday at 09:26 AM 214.4      12'$ /29/2023 Friday at 06:08 AM 216.6

## 2022-04-20 NOTE — Progress Notes (Signed)
Primary Physician/Referring:  [name], MD  [name], female    DOB: [birth year], 89 y.o.   MRN: [id number]  No chief complaint on file.  HPI:    Lisa Mcclure  is a 89 y.o.  female  with past medical history of obesity, chronic diastolic CHF, hypertensive heart disease and chronic 3A-B kidney disease, paroxysmal atrial fibrillation, last documented episode August 2021, anemia, hemorrhoids, diverticulosis, esophageal reflux, depression, OSA intolerant to CPAP on nocturnal 02, rheumatoid arthritis, and venous insufficiency, aortic and coronary calcification, s/p AAA repair on 12/20/2019 being followed by vascular surgery.    Patient presents to the office for follow-up of heart failure.  She is presently enrolled in RPM and PCM, recently has noticed worsening dyspnea and also worsening weight.  I had sent in for metolazone since then she is gradually losing weight and states that she has had increased urine output.  However she is extremely concerned that she will not be able to keep up with this as she has to go to the bathroom frequently, difficulty in mobility, also difficulty in getting frequent labs.  She request that she be admitted to the hospital.  She has developed weeping in her left leg due to edema after minor trauma.  Past Medical History:  Diagnosis Date   Anemia    h/o hemorrhoidal bleeding and blood transfusion   Chronic diastolic CHF (congestive heart failure) (HCC)    Depression    Diverticulosis    DOE (dyspnea on exertion)    Esophageal reflux    GERD (gastroesophageal reflux disease)    Hypothyroidism    Insomnia    LBP (low back pain)    OAB (overactive bladder)    Obesity    OSA (obstructive sleep apnea)    Osteoarthritis    Paroxysmal atrial fibrillation (HCC)    Rheumatoid arthritis(714.0)    Shoulder pain, bilateral    Unspecified essential hypertension    Venous insufficiency    Social History   Tobacco Use    Smoking status: Former    Packs/day: 0.20    Years: 10.00    Total pack years: 2.00    Types: Cigarettes    Quit date: 04/11/1984    Years since quitting: 38.0    Passive exposure: Past   Smokeless tobacco: Never  Substance Use Topics   Alcohol use: Yes    Alcohol/week: 0.0 standard drinks of alcohol    Comment: cocktail once a week   Marital Status: Widowed  ROS  Review of Systems  Constitutional: Positive for malaise/fatigue and weight gain.  Cardiovascular:  Positive for dyspnea on exertion and leg swelling. Negative for chest pain, orthopnea, palpitations and syncope.  Genitourinary:  Positive for bladder incontinence.   Objective  Blood pressure (!) 147/92, pulse 87, height '5\' 3"'$  (1.6 m), weight 214 lb (97.1 kg), SpO2 91 %.     04/20/2022    3:18 PM 03/22/2022    4:21 PM 03/22/2022    4:12 PM  Vitals with BMI  Height '5\' 3"'$   '5\' 3"'$   Weight 214 lbs  210 lbs  BMI 10.17  51.02  Systolic 585  277  Diastolic 92  76  Pulse 87 56 56     Physical Exam Vitals reviewed.  Constitutional:      Appearance: She is well-developed. She is obese.  Neck:     Vascular: Carotid bruit and JVD present.  Cardiovascular:     Rate and Rhythm: Normal rate and  regular rhythm.     Pulses: Intact distal pulses.     Heart sounds: S1 normal and S2 normal. Murmur heard.     Harsh midsystolic murmur is present with a grade of 3/6 at the upper right sternal border radiating to the apex.     No gallop.  Pulmonary:     Effort: Pulmonary effort is normal. No accessory muscle usage.     Breath sounds: Rales (Bibasilar) present.  Abdominal:     General: Bowel sounds are normal.     Palpations: Abdomen is soft.  Musculoskeletal:     Right lower leg: Edema (3+ pitting) present.     Left lower leg: Edema (3+ pitting edema, superficial wound noted in the left leg weeping serous fluid.  No signs of infection) present.    Laboratory examination:   Recent Labs    06/15/21 1712 06/16/21 0644  06/20/21 0536 12/01/21 1518 04/13/22 1107  NA 137 136 138 140 142  K 4.2 4.0 4.3 4.6 4.2  CL 93* 95* 93* 97 98  CO2 33* 32 37* 25 30*  GLUCOSE 98 240* 100* 100* 94  BUN 52* 50* 36* 32* 24  CREATININE 2.00* 1.59* 1.28* 1.62* 1.04*  CALCIUM 8.7* 8.7* 8.5* 8.9 8.9  GFRNONAA 24* 31* 41*  --   --       Latest Ref Rng & Units 04/13/2022   11:07 AM 12/01/2021    3:18 PM 06/20/2021    5:36 AM  CMP  Glucose 70 - 99 mg/dL 94  100  100   BUN 8 - 27 mg/dL 24  32  36   Creatinine 0.57 - 1.00 mg/dL 1.04  1.62  1.28   Sodium 134 - 144 mmol/L 142  140  138   Potassium 3.5 - 5.2 mmol/L 4.2  4.6  4.3   Chloride 96 - 106 mmol/L 98  97  93   CO2 20 - 29 mmol/L 30  25  37   Calcium 8.7 - 10.3 mg/dL 8.9  8.9  8.5       Latest Ref Rng & Units 06/21/2021    4:47 AM 06/20/2021    5:24 AM 06/18/2021    4:38 AM  CBC  WBC 4.0 - 10.5 K/uL 7.6  7.4  8.7   Hemoglobin 12.0 - 15.0 g/dL 8.7  8.7  8.5   Hematocrit 36.0 - 46.0 % 29.4  29.6  28.0   Platelets 150 - 400 K/uL 376  377  367    Lipid Panel    Component Value Date/Time   CHOL 126 03/14/2019 1121   TRIG 82 03/14/2019 1121   HDL 49 03/14/2019 1121   CHOLHDL 5.1 CALC 12/19/2006 0916   VLDL 26 12/19/2006 0916   LDLCALC 61 03/14/2019 1121   LDLDIRECT 159.2 12/19/2006 0916   HEMOGLOBIN A1C No results found for: "HGBA1C", "MPG" TSH No results for input(s): "TSH" in the last 8760 hours.  External labs:  02/22/2021: BUN 36, creatinine 1.3, GFR 39, sodium 130, potassium 5.4 Hgb 11.3, HCT 35.1, platelet 336 Magnesium 2.4   Allergies   Allergies  Allergen Reactions   Statins Other (See Comments)    Muscle aches and INTERNAL BLEEDING   Atorvastatin     Other reaction(s): Myalgias   Gabapentin Other (See Comments)    "Made me loopy"   Methocarbamol Other (See Comments)    "Made me loopy"    Final Medications at End of Visit    Current Outpatient Medications:  albuterol (PROVENTIL) (2.5 MG/3ML) 0.083% nebulizer solution, Take 3 mLs  (2.5 mg total) by nebulization every 6 (six) hours as needed for wheezing or shortness of breath., Disp: 75 mL, Rfl: 12   albuterol (VENTOLIN HFA) 108 (90 Base) MCG/ACT inhaler, Inhale 2 puffs into the lungs every 6 (six) hours as needed for wheezing or shortness of breath., Disp: 8 g, Rfl: 2   amiodarone (PACERONE) 100 MG tablet, TAKE TWO TABLETS BY MOUTH EVERY MORNING, Disp: 60 tablet, Rfl: 5   apixaban (ELIQUIS) 2.5 MG TABS tablet, Take 1 tablet (2.5 mg total) by mouth 2 (two) times daily., Disp: 180 tablet, Rfl: 3   atenolol (TENORMIN) 50 MG tablet, Take 1 tablet (50 mg total) by mouth daily., Disp: 30 tablet, Rfl: 2   budesonide (PULMICORT) 0.5 MG/2ML nebulizer solution, Take 2 mLs (0.5 mg total) by nebulization in the morning and at bedtime., Disp: 120 mL, Rfl: 1   busPIRone (BUSPAR) 7.5 MG tablet, Take 7.5 mg by mouth 3 (three) times daily., Disp: , Rfl:    dapagliflozin propanediol (FARXIGA) 10 MG TABS tablet, Take 1 tablet (10 mg total) by mouth daily before breakfast., Disp: 30 tablet, Rfl: 6   diclofenac sodium (VOLTAREN) 1 % GEL, Apply 2.25 g topically 3 (three) times daily as needed (for pain). , Disp: , Rfl:    furosemide (LASIX) 20 MG tablet, Take 2 tablets (40 mg total) by mouth daily as needed for fluid., Disp: 90 tablet, Rfl: 0   HYDROcodone-acetaminophen (NORCO) 7.5-325 MG tablet, Take 1 tablet by mouth every 6 (six) hours as needed for moderate pain., Disp: 20 tablet, Rfl: 0   ipratropium (ATROVENT) 0.03 % nasal spray, Place 2 sprays into both nostrils every 12 (twelve) hours., Disp: 30 mL, Rfl: 1   latanoprost (XALATAN) 0.005 % ophthalmic solution, Place 1 drop into both eyes at bedtime., Disp: , Rfl:    levothyroxine (SYNTHROID) 125 MCG tablet, Take 125 mcg by mouth daily before breakfast. , Disp: , Rfl:    losartan (COZAAR) 100 MG tablet, Take 1 tablet (100 mg total) by mouth daily. TAKE ONE TABLET BY MOUTH AT NOON Strength: 100 mg, Disp: 90 tablet, Rfl: 3   metolazone  (ZAROXOLYN) 2.5 MG tablet, Take 1 tablet (2.5 mg total) by mouth as needed (volume overload)., Disp: 30 tablet, Rfl: 0   rOPINIRole (REQUIP) 1 MG tablet, Take 1 mg by mouth See admin instructions. Take 1 mg by mouth once a day between 3 PM-4 PM and an additional 1 mg in the evening, if no relief, Disp: , Rfl: 12   ferrous sulfate 325 (65 FE) MG tablet, Take 1 tablet (325 mg total) by mouth daily with breakfast. (Patient not taking: Reported on 04/20/2022), Disp: 90 tablet, Rfl: 0    Radiology:   CT Abdomen and Pelvis W Contrast 12/20/2019: 1. Large Infrarenal Abdominal Aortic Aneurysm measuring up to 5.7 cm with mild perianeurysmal inflammation. Differential considerations include impending AAA rupture, mycotic AAA, symptomatic AAA. Recommend urgent transfer to emergency department and Vascular Surgery consultation. Aortic aneurysm NOS (ICD10-I71.9). Aortic Atherosclerosis (ICD10-I70.0). 2. No other acute or inflammatory process identified. 5 cm fat containing left pelvic hernia. Large bowel diverticulosis. 3. Simple appearing 4 cm left ovarian cyst, recommend follow-up Ultrasound in 6-12 months  Chest X-ray 12/20/2019:  Diffuse cardiac enlargement. Emphysematous changes in the lungs. Coarse interstitial pattern to the lungs could represent fibrosis or edema. No focal consolidation. No pleural effusions. No pneumothorax. Calcified and tortuous aorta. Degenerative changes in the shoulders and  spine. Old rib fractures. Similar appearance to previous study. IMPRESSION: 1. Cardiac enlargement. 2. Coarse interstitial pattern to the lungs could represent fibrosis or edema.  Abdominal X-Ray 12/20/2019:  Interval placement of aorto iliac stent graft. Nonobstructive bowel gas pattern.  Cardiac Studies:   Lexiscan myoview stress test 12/08/2017: 1. Lexiscan stress test was performed. Exercise capacity was not assessed. Stress symptoms included abdominal pain. Blood pressure was normal. The resting  and stress electrocardiogram demonstrated normal sinus rhythm, normal resting conduction, no resting arrhythmias and normal rest repolarization. 2. The overall quality of the study is good. There is no evidence of abnormal lung activity. Stress and rest SPECT images demonstrate homogeneous tracer distribution throughout the myocardium. Gated SPECT imaging reveals normal myocardial thickening and wall motion. The left ventricular ejection fraction was normal (54%). 3. Low risk study  Ambulatory cardiac telemetry 14 days 03/22/2021 - 04/04/2021: Predominant underlying rhythm was sinus with first-degree AV block.  Patient with 19 brief episodes of SVT which were asymptomatic.  Frequent PVCs with 20.4% burden and episodes of ventricular bigeminy and trigeminy which correlated with patient triggered events.  No evidence of atrial fibrillation, ventricular tachycardia, pauses >3 seconds, or high degree AV block.  PCV ECHOCARDIOGRAM COMPLETE 02/08/2022  Narrative Echocardiogram 02/08/2022: Normal LV systolic function with visual EF 55-60%. Left ventricle cavity is normal in size. Moderate concentric hypertrophy of the left ventricle. Normal global wall motion. Indeterminate diastolic filling pattern. Calculated EF 56%. Left atrial cavity is severely dilated at 46.9 ml/m^2. Trileaflet aortic valve. Moderate aortic stenosis. Mild (Grade I) aortic regurgitation. Mild aortic valve leaflet calcification. AVA (VTI) measures 0.8 cm^2. AV Mean Grad measures 22.9 mmHg. AV Pk Vel measures 3.28 m/s. Mild (Grade I) mitral regurgitation. Mild calcification of the mitral valve annulus. Mild mitral valve leaflet thickening with mild calcification. Mildly restricted mitral valve leaflets. Structurally normal tricuspid valve with trace regurgitation. No evidence of pulmonary hypertension. Compared to 04/2021, aortic stenosis is still moderate and there is mild aortic regurgitation present.  Previously noted moderate pulm  hypertension not present.  EKG   EKG 04/20/2022: Atrial fibrillation with rapid ventricular response at the rate of 111 bpm, normal axis, poor R progression, cannot exclude anteroseptal infarct old.  No evidence of ischemia, normal QT interval.  Compared to 10/07/2021, sinus rhythm has been replaced.  Assessment     ICD-10-CM   1. Acute on chronic diastolic congestive heart failure (HCC)  I50.33     2. Primary hypertension  I10     3. Paroxysmal atrial fibrillation (HCC)  I48.0 EKG 12-Lead     This patients CHA2DS2-VASc Score 6 (CHF, HTN, vasc, age, F) and yearly risk of stroke 9.8%.  No orders of the defined types were placed in this encounter.  Medications Discontinued During This Encounter  Medication Reason   busPIRone (BUSPAR) 7.5 MG tablet Completed Course   Recommendations:   Lisa Mcclure  is a 89 y.o.  female  with past medical history of obesity, chronic diastolic CHF, hypertensive heart disease and chronic 3A-B kidney disease, paroxysmal atrial fibrillation, last documented episode August 2021, anemia, hemorrhoids, diverticulosis, esophageal reflux, depression, OSA intolerant to CPAP on nocturnal 02, rheumatoid arthritis, and venous insufficiency, aortic and coronary calcification, s/p AAA repair on 12/20/2019 being followed by vascular surgery.    Last GI bleed was in March 2023 secondary to gastric ulcer requiring blood transfusion.  Fortunately she has not had any further episodes of GI bleed.  She is tolerating anticoagulation with Eliquis.  There was  no culprit medication or NSAIDs involved.  She has had GI bleed in 2017 requiring blood transfusion as well  1.  Acute on chronic diastolic (congestive) heart failure (HCC) Heart failure now with 3-4+ bilateral leg edema, shortness of breath and fatigue.  She has started to respond after I had recently sent a prescription for metolazone.  However she is in extreme distress as she has to go to the bathroom, she is  incontinent, difficulty in mobility.  Best option would be to admitted to the hospital for diuresis for 24 to 48 hours and at the same time consider direct-current cardioversion.  Acute decompensated failure could be related to recurrence of atrial fibrillation.  There is no hospital beds, we have made a request for hospitalization to be admitted to telemetry bed.  Patient will await further instructions.  For now she will continue with metolazone.  She will need close monitoring with hemodynamics and also frequent blood test which will be accomplished much more easily while in the hospital.  Patient enrolled in RPM: Decemebr 2023 DATA Average Weight Level 209.02 lbs Lowest Weight Level 203.6 lbs Highest Weight Level 215.6 lbs  2. Primary hypertension Blood pressure is well-controlled, permissive hypertension is indicated in view of stage IV chronic kidney disease.  Hence I did not make any changes presently.  3. Paroxysmal atrial fibrillation [facility]) Patient is back in atrial fibrillation.  She is presently on Eliquis and is tolerating this without recurrence of GI bleed.  Continue the same for now, plan on direct-current cardioversion probably in 2 to 3 days depending upon her hospitalization.  [name] is present and all questions answered.  This was a 40-minute office visit encounter.   Adrian Prows, MD, [facility] 04/20/2022, 5:43 PM Office: [phone] Fax: [phone] Pager: [phone]

## 2022-04-22 ENCOUNTER — Encounter (HOSPITAL_COMMUNITY): Payer: Self-pay | Admitting: Cardiology

## 2022-04-22 ENCOUNTER — Ambulatory Visit: Payer: Self-pay

## 2022-04-22 ENCOUNTER — Other Ambulatory Visit: Payer: Self-pay

## 2022-04-22 ENCOUNTER — Inpatient Hospital Stay (HOSPITAL_COMMUNITY)
Admission: RE | Admit: 2022-04-22 | Discharge: 2022-04-25 | DRG: 308 | Disposition: A | Discharge status: Home Health | Payer: Medicare Other | Source: Ambulatory Visit | Attending: Cardiology | Admitting: Cardiology

## 2022-04-22 ENCOUNTER — Telehealth: Payer: Medicare Other | Admitting: Primary Care

## 2022-04-22 DIAGNOSIS — I10 Essential (primary) hypertension: Secondary | ICD-10-CM

## 2022-04-22 DIAGNOSIS — K219 Gastro-esophageal reflux disease without esophagitis: Secondary | ICD-10-CM | POA: Diagnosis present

## 2022-04-22 DIAGNOSIS — Z7901 Long term (current) use of anticoagulants: Secondary | ICD-10-CM | POA: Diagnosis not present

## 2022-04-22 DIAGNOSIS — D6869 Other thrombophilia: Secondary | ICD-10-CM | POA: Diagnosis present

## 2022-04-22 DIAGNOSIS — Z8249 Family history of ischemic heart disease and other diseases of the circulatory system: Secondary | ICD-10-CM | POA: Diagnosis not present

## 2022-04-22 DIAGNOSIS — Z7951 Long term (current) use of inhaled steroids: Secondary | ICD-10-CM

## 2022-04-22 DIAGNOSIS — E039 Hypothyroidism, unspecified: Secondary | ICD-10-CM | POA: Diagnosis present

## 2022-04-22 DIAGNOSIS — I110 Hypertensive heart disease with heart failure: Secondary | ICD-10-CM | POA: Diagnosis not present

## 2022-04-22 DIAGNOSIS — I509 Heart failure, unspecified: Secondary | ICD-10-CM | POA: Diagnosis not present

## 2022-04-22 DIAGNOSIS — E66812 Obesity, class 2: Secondary | ICD-10-CM

## 2022-04-22 DIAGNOSIS — Z79890 Hormone replacement therapy (postmenopausal): Secondary | ICD-10-CM | POA: Diagnosis not present

## 2022-04-22 DIAGNOSIS — Z87891 Personal history of nicotine dependence: Secondary | ICD-10-CM | POA: Diagnosis not present

## 2022-04-22 DIAGNOSIS — Z801 Family history of malignant neoplasm of trachea, bronchus and lung: Secondary | ICD-10-CM | POA: Diagnosis not present

## 2022-04-22 DIAGNOSIS — N1832 Chronic kidney disease, stage 3b: Secondary | ICD-10-CM

## 2022-04-22 DIAGNOSIS — G4733 Obstructive sleep apnea (adult) (pediatric): Secondary | ICD-10-CM | POA: Diagnosis present

## 2022-04-22 DIAGNOSIS — I480 Paroxysmal atrial fibrillation: Secondary | ICD-10-CM

## 2022-04-22 DIAGNOSIS — Z79899 Other long term (current) drug therapy: Secondary | ICD-10-CM

## 2022-04-22 DIAGNOSIS — Z23 Encounter for immunization: Secondary | ICD-10-CM

## 2022-04-22 DIAGNOSIS — Z825 Family history of asthma and other chronic lower respiratory diseases: Secondary | ICD-10-CM | POA: Diagnosis not present

## 2022-04-22 DIAGNOSIS — I493 Ventricular premature depolarization: Secondary | ICD-10-CM

## 2022-04-22 DIAGNOSIS — I4819 Other persistent atrial fibrillation: Principal | ICD-10-CM | POA: Diagnosis present

## 2022-04-22 DIAGNOSIS — I129 Hypertensive chronic kidney disease with stage 1 through stage 4 chronic kidney disease, or unspecified chronic kidney disease: Secondary | ICD-10-CM | POA: Diagnosis not present

## 2022-04-22 DIAGNOSIS — Z8719 Personal history of other diseases of the digestive system: Secondary | ICD-10-CM | POA: Diagnosis not present

## 2022-04-22 DIAGNOSIS — I2510 Atherosclerotic heart disease of native coronary artery without angina pectoris: Secondary | ICD-10-CM | POA: Diagnosis present

## 2022-04-22 DIAGNOSIS — Z803 Family history of malignant neoplasm of breast: Secondary | ICD-10-CM | POA: Diagnosis not present

## 2022-04-22 DIAGNOSIS — I4891 Unspecified atrial fibrillation: Secondary | ICD-10-CM | POA: Diagnosis not present

## 2022-04-22 DIAGNOSIS — I5032 Chronic diastolic (congestive) heart failure: Secondary | ICD-10-CM

## 2022-04-22 DIAGNOSIS — I130 Hypertensive heart and chronic kidney disease with heart failure and stage 1 through stage 4 chronic kidney disease, or unspecified chronic kidney disease: Secondary | ICD-10-CM | POA: Diagnosis present

## 2022-04-22 DIAGNOSIS — Z6835 Body mass index (BMI) 35.0-35.9, adult: Secondary | ICD-10-CM

## 2022-04-22 DIAGNOSIS — I5033 Acute on chronic diastolic (congestive) heart failure: Secondary | ICD-10-CM | POA: Diagnosis present

## 2022-04-22 DIAGNOSIS — Z888 Allergy status to other drugs, medicaments and biological substances status: Secondary | ICD-10-CM | POA: Diagnosis not present

## 2022-04-22 DIAGNOSIS — M069 Rheumatoid arthritis, unspecified: Secondary | ICD-10-CM | POA: Diagnosis present

## 2022-04-22 DIAGNOSIS — D649 Anemia, unspecified: Secondary | ICD-10-CM | POA: Diagnosis not present

## 2022-04-22 DIAGNOSIS — E669 Obesity, unspecified: Secondary | ICD-10-CM

## 2022-04-22 DIAGNOSIS — D638 Anemia in other chronic diseases classified elsewhere: Secondary | ICD-10-CM | POA: Diagnosis not present

## 2022-04-22 DIAGNOSIS — E1122 Type 2 diabetes mellitus with diabetic chronic kidney disease: Secondary | ICD-10-CM | POA: Diagnosis present

## 2022-04-22 LAB — BRAIN NATRIURETIC PEPTIDE: B Natriuretic Peptide: 733.7 pg/mL — ABNORMAL HIGH (ref 0.0–100.0)

## 2022-04-22 MED ORDER — ALBUTEROL SULFATE (2.5 MG/3ML) 0.083% IN NEBU: 2.5000 mg | INHALATION_SOLUTION | Freq: Four times a day (QID) | RESPIRATORY_TRACT | Status: DC | PRN

## 2022-04-22 MED ORDER — PNEUMOCOCCAL 20-VAL CONJ VACC 0.5 ML IM SUSY
0.5000 mL | PREFILLED_SYRINGE | INTRAMUSCULAR | Status: AC
  Administered 2022-04-24: 0.5 mL via INTRAMUSCULAR
  Filled 2022-04-22: qty 0.5

## 2022-04-22 MED ORDER — ATENOLOL 50 MG PO TABS: 50.0000 mg | ORAL_TABLET | Freq: Every day | ORAL | Status: DC

## 2022-04-22 MED ORDER — SODIUM CHLORIDE 0.9 % IV SOLN: 250.0000 mL | INTRAVENOUS | Status: DC | PRN

## 2022-04-22 MED ORDER — FUROSEMIDE 10 MG/ML IJ SOLN
40.0000 mg | Freq: Three times a day (TID) | INTRAMUSCULAR | Status: AC
  Administered 2022-04-22 – 2022-04-23 (×3): 40 mg via INTRAVENOUS
  Filled 2022-04-22 (×3): qty 4

## 2022-04-22 MED ORDER — APIXABAN 2.5 MG PO TABS
2.5000 mg | ORAL_TABLET | Freq: Two times a day (BID) | ORAL | Status: DC
  Administered 2022-04-22 – 2022-04-25 (×6): 2.5 mg via ORAL
  Filled 2022-04-22 (×6): qty 1

## 2022-04-22 MED ORDER — DAPAGLIFLOZIN PROPANEDIOL 10 MG PO TABS
10.0000 mg | ORAL_TABLET | Freq: Every day | ORAL | Status: DC
  Administered 2022-04-23 – 2022-04-25 (×3): 10 mg via ORAL
  Filled 2022-04-22 (×3): qty 1

## 2022-04-22 MED ORDER — ATENOLOL 50 MG PO TABS
50.0000 mg | ORAL_TABLET | Freq: Every day | ORAL | Status: DC
  Administered 2022-04-22 – 2022-04-24 (×3): 50 mg via ORAL
  Filled 2022-04-22 (×4): qty 1

## 2022-04-22 MED ORDER — SODIUM CHLORIDE 0.9% FLUSH
3.0000 mL | INTRAVENOUS | Status: DC | PRN
  Administered 2022-04-23: 3 mL via INTRAVENOUS

## 2022-04-22 MED ORDER — AMIODARONE HCL 200 MG PO TABS
200.0000 mg | ORAL_TABLET | Freq: Every morning | ORAL | Status: DC
  Administered 2022-04-23 – 2022-04-25 (×3): 200 mg via ORAL
  Filled 2022-04-22 (×3): qty 1

## 2022-04-22 MED ORDER — BUSPIRONE HCL 5 MG PO TABS
7.5000 mg | ORAL_TABLET | Freq: Three times a day (TID) | ORAL | Status: DC
  Administered 2022-04-22 – 2022-04-25 (×8): 7.5 mg via ORAL
  Filled 2022-04-22 (×8): qty 2

## 2022-04-22 MED ORDER — SODIUM CHLORIDE 0.9% FLUSH
3.0000 mL | Freq: Two times a day (BID) | INTRAVENOUS | Status: DC
  Administered 2022-04-22 – 2022-04-25 (×6): 3 mL via INTRAVENOUS

## 2022-04-22 MED ORDER — LEVOTHYROXINE SODIUM 25 MCG PO TABS
125.0000 ug | ORAL_TABLET | Freq: Every day | ORAL | Status: DC
  Administered 2022-04-23 – 2022-04-25 (×3): 125 ug via ORAL
  Filled 2022-04-22 (×3): qty 1

## 2022-04-22 MED ORDER — ONDANSETRON HCL 4 MG/2ML IJ SOLN: 4.0000 mg | Freq: Four times a day (QID) | INTRAMUSCULAR | Status: DC | PRN

## 2022-04-22 MED ORDER — ROPINIROLE HCL 1 MG PO TABS
1.0000 mg | ORAL_TABLET | Freq: Two times a day (BID) | ORAL | Status: DC
  Administered 2022-04-22 – 2022-04-23 (×3): 1 mg via ORAL
  Filled 2022-04-22 (×4): qty 1

## 2022-04-22 MED ORDER — LATANOPROST 0.005 % OP SOLN
1.0000 [drp] | Freq: Every day | OPHTHALMIC | Status: DC
  Administered 2022-04-22 – 2022-04-24 (×3): 1 [drp] via OPHTHALMIC
  Filled 2022-04-22 (×2): qty 2.5

## 2022-04-22 MED ORDER — INFLUENZA VAC A&B SA ADJ QUAD 0.5 ML IM PRSY
0.5000 mL | PREFILLED_SYRINGE | INTRAMUSCULAR | Status: AC
  Administered 2022-04-24: 0.5 mL via INTRAMUSCULAR
  Filled 2022-04-22: qty 0.5

## 2022-04-22 MED ORDER — HYDROCODONE-ACETAMINOPHEN 7.5-325 MG PO TABS
1.0000 | ORAL_TABLET | Freq: Four times a day (QID) | ORAL | Status: DC | PRN
  Administered 2022-04-22 – 2022-04-25 (×9): 1 via ORAL
  Filled 2022-04-22 (×9): qty 1

## 2022-04-22 MED ORDER — ACETAMINOPHEN 325 MG PO TABS
650.0000 mg | ORAL_TABLET | ORAL | Status: DC | PRN
  Administered 2022-04-25: 650 mg via ORAL
  Filled 2022-04-22: qty 2

## 2022-04-22 MED ORDER — LOSARTAN POTASSIUM 50 MG PO TABS
100.0000 mg | ORAL_TABLET | ORAL | Status: DC
  Administered 2022-04-23 – 2022-04-25 (×3): 100 mg via ORAL
  Filled 2022-04-22 (×3): qty 2

## 2022-04-22 MED ORDER — POTASSIUM CHLORIDE CRYS ER 20 MEQ PO TBCR
20.0000 meq | EXTENDED_RELEASE_TABLET | Freq: Two times a day (BID) | ORAL | Status: DC
  Administered 2022-04-22 – 2022-04-25 (×7): 20 meq via ORAL
  Filled 2022-04-22 (×7): qty 1

## 2022-04-22 NOTE — H&P (Addendum)
CARDIOLOGY ADMIT NOTE   [name] MRN: [id number] DOB/AGE: June 04, 1932 89 y.o.  Admit date: 04/22/2022 Primary Physician:  [name], MD  [name], female    DOB: [birth year], 89 y.o.   MRN: [id number]  No chief complaint on file.  HPI:    [name]  is a 89 y.o. female with past medical history of obesity, chronic diastolic CHF, hypertensive heart disease and chronic 3B kidney disease, paroxysmal atrial fibrillation, chronic anemia, hemorrhoids, diverticulosis, esophageal reflux, history of GI bleed needing blood transfusion in March 2023, depression, OSA intolerant to CPAP on nocturnal 02, rheumatoid arthritis, and venous insufficiency, aortic and coronary calcification, s/p AAA repair on 12/20/2019 being followed by vascular surgery.   I had seen the patient 2 days ago with gradually worsening shortness of breath, weight gain, decreased urine output and nonresponse to furosemide and was started on Zaroxolyn in the outpatient basis.  In spite of this she continues to have significant amount of leg edema.  I had tried to make arrangements for her to be directly admitted to the hospital, she presents this afternoon with same complaints of worsening dyspnea, inability to get her weight back to baseline and continued leg edema.  She denies palpitations, dizziness or syncope or chest pain.  Past Medical History:  Diagnosis Date   Acute renal failure superimposed on stage 3b chronic kidney disease ([place]) 06/15/2021   Anemia    h/o hemorrhoidal bleeding and blood transfusion   Chronic diastolic CHF (congestive heart failure) (HCC)    Depression    Diverticulosis    DOE (dyspnea on exertion)    Esophageal reflux    GERD (gastroesophageal reflux disease)    Hypothyroidism    Insomnia    LBP (low back pain)    OAB (overactive bladder)    Obesity    OSA (obstructive sleep apnea)    Osteoarthritis    Paroxysmal atrial fibrillation  (HCC)    Rheumatoid arthritis(714.0)    Shoulder pain, bilateral    Unspecified essential hypertension    Venous insufficiency    Past Surgical History:  Procedure Laterality Date   ABDOMINAL AORTIC ENDOVASCULAR STENT GRAFT N/A 12/20/2019   Procedure: Aortogram including catheter selection of aorta and bilateral iliac arteriogram, Endovascular repair of infrarenal abdominal aortic aneurysm with bifurcated stent graft (26 mm x 14 x 12 main body, right bell bottom with a 20 mm x 10 cm piece, and left bell bottom with a 16 mm x 12 cm piece) ;  Surgeon: Marty Heck, MD;  Location: [facility] OR;  Service: Vascular;  Laterality: N/A;   [place]   BIOPSY  06/18/2021   Procedure: BIOPSY;  Surgeon: Ronnette Juniper, MD;  Location: [facility] ENDOSCOPY;  Service: Gastroenterology;;   bladder abduction-1996  1996   breast biopsy Right 1980   CARDIOVERSION N/A 12/24/2019   Procedure: CARDIOVERSION;  Surgeon: Adrian Prows, MD;  Location: [facility] ENDOSCOPY;  Service: Cardiovascular;  Laterality: N/A;   [place]     ESOPHAGOGASTRODUODENOSCOPY (EGD) WITH PROPOFOL N/A 06/18/2021   Procedure: ESOPHAGOGASTRODUODENOSCOPY (EGD) WITH PROPOFOL;  Surgeon: Ronnette Juniper, MD;  Location: [facility] ENDOSCOPY;  Service: Gastroenterology;  Laterality: N/A;   FLEXIBLE SIGMOIDOSCOPY N/A 04/10/2015   Procedure: FLEXIBLE SIGMOIDOSCOPY;  Surgeon: Arta Silence, MD;  Location: [facility] ENDOSCOPY;  Service: Endoscopy;  Laterality: N/A;   knee arthroscopy Right 1996, 2010  KNEE ARTHROSCOPY W/ AUTOGENOUS CARTILAGE IMPLANTATION (ACI) PROCEDURE Left 1994, 1995   REFRACTIVE SURGERY  01/2020   SHOULDER SURGERY  1990   TEE WITHOUT CARDIOVERSION N/A 12/24/2019   Procedure: TRANSESOPHAGEAL ECHOCARDIOGRAM (TEE);  Surgeon: Adrian Prows, MD;  Location: [place];  Service: Cardiovascular;  Laterality: N/A;   [place]  Bilateral 12/20/2019   Procedure: Ultrasound-guided access of bilateral common femoral arteries for delivery of endograft and percutaneous closure;  Surgeon: Marty Heck, MD;  Location: [place];  Service: Vascular;  Laterality: Bilateral;   VESICOVAGINAL FISTULA CLOSURE W/ TAH     WRIST SURGERY  1967   Social History   Socioeconomic History   Marital status: Widowed    Spouse name: Not on file   Number of children: 3   Years of education: College   Highest education level: Bachelor's degree (e.g., BA, AB, BS)  Occupational History   Occupation: Retired  Tobacco Use   Smoking status: Former    Packs/day: 0.20    Years: 10.00    Total pack years: 2.00    Types: Cigarettes    Quit date: 04/11/1984    Years since quitting: 38.0    Passive exposure: Past   Smokeless tobacco: Never  Vaping Use   Vaping Use: Never used  Substance and Sexual Activity   Alcohol use: Yes    Alcohol/week: 0.0 standard drinks of alcohol    Comment: cocktail once a week   Drug use: No   Sexual activity: Not Currently  Other Topics Concern   Not on file  Social History Narrative   Widowed   Lives alone   3 children, 2 living   OCCUPATION: retired from Physicist, medical, 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 [place] May 2016 for 11 days   Drinks 1 cup of coffee in the morning   Social Determinants of Health   Financial Resource Strain: Low Risk  (08/11/2021)   Overall Financial Resource Strain (CARDIA)    Difficulty of Paying Living Expenses: Not hard at all  Food Insecurity: No Food Insecurity (04/22/2022)   Hunger Vital Sign    Worried About Running Out of Food in the Last Year: Never true    Ran Out of Food in the Last Year: Never true  Transportation Needs: No Transportation Needs (04/22/2022)   PRAPARE - Hydrologist (Medical): No    Lack of Transportation (Non-Medical): No  Physical Activity: Inactive (08/11/2021)   Exercise Vital Sign    Days of Exercise per  Week: 0 days    Minutes of Exercise per Session: 0 min  Stress: No Stress Concern Present (08/11/2021)   [place]    Feeling of Stress : Only a little  Social Connections: Moderately Isolated (08/11/2021)   Social Connection and Isolation Panel [NHANES]    Frequency of Communication with Friends and Family: More than three times a week    Frequency of Social Gatherings with Friends and Family: More than three times a week    Attends Religious Services: More than 4 times per year    Active Member of [employer] or Organizations: No    Attends Archivist Meetings: Never    Marital Status: Widowed  Intimate Partner Violence: Not At Risk (04/22/2022)   Humiliation, Afraid, Rape, and Kick questionnaire    Fear of Current or Ex-Partner: No    Emotionally Abused:  No    Physically Abused: No    Sexually Abused: No   Family History  Problem Relation Age of Onset   Lung cancer Mother    COPD Father    Breast cancer Maternal Aunt    Heart attack Maternal Grandmother 80   Lung cancer Son     ROS  Review of Systems  Constitutional: Positive for malaise/fatigue.  Cardiovascular:  Positive for dyspnea on exertion and leg swelling. Negative for chest pain.   Objective      04/22/2022    1:44 PM 04/20/2022    3:18 PM 03/22/2022    4:21 PM  Vitals with BMI  Height '5\' 3"'$  '5\' 3"'$    Weight 214 lbs 8 oz 214 lbs   BMI 74.12 87.86   Systolic 767 209   Diastolic 88 92   Pulse 470 87 56    Physical Exam Constitutional:      General: She is not in acute distress.    Appearance: She is obese.  Neck:     Vascular: Carotid bruit (bilateral) and JVD present.  Cardiovascular:     Rate and Rhythm: Normal rate. Rhythm irregular.     Pulses: Normal pulses and intact distal pulses.     Heart sounds: No murmur heard. Pulmonary:     Effort: Pulmonary effort is normal.     Breath sounds: Normal breath sounds.  Abdominal:      General: Bowel sounds are normal.     Palpations: Abdomen is soft.  Musculoskeletal:     Right lower leg: Edema (2+ bilateral below-knee pitting edema present, erythematous skin below the knee.) present.     Left lower leg: Edema (2+ bilateral below-knee pitting edema present, erythematous skin below the knee.) present.  Skin:    Capillary Refill: Capillary refill takes less than 2 seconds.    Laboratory examination:   Recent Labs    06/15/21 1712 06/16/21 0644 06/20/21 0536 12/01/21 1518 04/13/22 1107  NA 137 136 138 140 142  K 4.2 4.0 4.3 4.6 4.2  CL 93* 95* 93* 97 98  CO2 33* 32 37* 25 30*  GLUCOSE 98 240* 100* 100* 94  BUN 52* 50* 36* 32* 24  CREATININE 2.00* 1.59* 1.28* 1.62* 1.04*  CALCIUM 8.7* 8.7* 8.5* 8.9 8.9  GFRNONAA 24* 31* 41*  --   --        Latest Ref Rng & Units 04/13/2022   11:07 AM 12/01/2021    3:18 PM 06/20/2021    5:36 AM  CMP  Glucose 70 - 99 mg/dL 94  100  100   BUN 8 - 27 mg/dL 24  32  36   Creatinine 0.57 - 1.00 mg/dL 1.04  1.62  1.28   Sodium 134 - 144 mmol/L 142  140  138   Potassium 3.5 - 5.2 mmol/L 4.2  4.6  4.3   Chloride 96 - 106 mmol/L 98  97  93   CO2 20 - 29 mmol/L 30  25  37   Calcium 8.7 - 10.3 mg/dL 8.9  8.9  8.5       Latest Ref Rng & Units 06/21/2021    4:47 AM 06/20/2021    5:24 AM 06/18/2021    4:38 AM  CBC  WBC 4.0 - 10.5 K/uL 7.6  7.4  8.7   Hemoglobin 12.0 - 15.0 g/dL 8.7  8.7  8.5   Hematocrit 36.0 - 46.0 % 29.4  29.6  28.0   Platelets 150 - 400  K/uL 376  377  367     HEMOGLOBIN A1C No results found for: "HGBA1C", "MPG" Lab Results  Component Value Date   TSH 0.724 12/23/2019   BNP (last 3 results) Recent Labs    12/01/21 1518 04/13/22 1106 04/22/22 1453  BNP 178.4* 486.9* 733.7*   External labs:   Labs 10/20/2021:  Serum glucose 118 mg, BUN 43, creatinine 1.3, EGFR 38 mL.  Labs 01/25/2022:  Hb 12.8/HCT 36.2, platelets 315.  Medications and allergies   Allergies  Allergen Reactions   Statins Other  (See Comments)    Muscle aches and INTERNAL BLEEDING   Atorvastatin     Other reaction(s): Myalgias   Gabapentin Other (See Comments)    "Made me loopy"   Methocarbamol Other (See Comments)    "Made me loopy"      sodium chloride      Current Outpatient Medications  Medication Instructions   albuterol (PROVENTIL) 2.5 mg, Nebulization, Every 6 hours PRN   albuterol (VENTOLIN HFA) 108 (90 Base) MCG/ACT inhaler 2 puffs, Inhalation, Every 6 hours PRN   amiodarone (PACERONE) 200 mg, Oral, Every morning   apixaban (ELIQUIS) 2.5 mg, Oral, 2 times daily   atenolol (TENORMIN) 50 mg, Oral, Daily   budesonide (PULMICORT) 0.5 mg, Nebulization, 2 times daily   busPIRone (BUSPAR) 7.5 mg, Oral, 3 times daily   dapagliflozin propanediol (FARXIGA) 10 mg, Oral, Daily before breakfast   diclofenac sodium (VOLTAREN) 2.25 g, Topical, 3 times daily PRN   ferrous sulfate 325 mg, Oral, Daily with breakfast   furosemide (LASIX) 40 mg, Oral, Daily PRN   HYDROcodone-acetaminophen (NORCO) 7.5-325 MG tablet 1 tablet, Oral, Every 6 hours PRN   ipratropium (ATROVENT) 0.03 % nasal spray 2 sprays, Each Nare, Every 12 hours   latanoprost (XALATAN) 0.005 % ophthalmic solution 1 drop, Both Eyes, Daily at bedtime   levothyroxine (SYNTHROID) 125 mcg, Oral, Daily before breakfast   losartan (COZAAR) 100 mg, Oral, Daily, TAKE ONE TABLET BY MOUTH AT NOON Strength: 100 mg   metolazone (ZAROXOLYN) 2.5 mg, Oral, As needed   rOPINIRole (REQUIP) 1 mg, Oral, See admin instructions, Take 1 mg by mouth once a day between 3 PM-4 PM and an additional 1 mg in the evening, if no relief    Current Facility-Administered Medications:    0.9 %  sodium chloride infusion, 250 mL, Intravenous, PRN, Adrian Prows, MD   acetaminophen (TYLENOL) tablet 650 mg, 650 mg, Oral, Q4H PRN, Adrian Prows, MD   albuterol (PROVENTIL) (2.5 MG/3ML) 0.083% nebulizer solution 2.5 mg, 2.5 mg, Nebulization, Q6H PRN, Adrian Prows, MD   [START ON 04/23/2022]  amiodarone (PACERONE) tablet 200 mg, 200 mg, Oral, q morning, Adrian Prows, MD   apixaban (ELIQUIS) tablet 2.5 mg, 2.5 mg, Oral, BID, Adrian Prows, MD   atenolol (TENORMIN) tablet 50 mg, 50 mg, Oral, Daily, Adrian Prows, MD   busPIRone (BUSPAR) tablet 7.5 mg, 7.5 mg, Oral, TID, Adrian Prows, MD   [START ON 04/23/2022] dapagliflozin propanediol (FARXIGA) tablet 10 mg, 10 mg, Oral, QAC breakfast, Adrian Prows, MD   furosemide (LASIX) injection 40 mg, 40 mg, Intravenous, Q8H, Adrian Prows, MD, 40 mg at 04/22/22 1612   HYDROcodone-acetaminophen ([place]) 7.5-325 MG per tablet 1 tablet, 1 tablet, Oral, Q6H PRN, Adrian Prows, MD, 1 tablet at 04/22/22 1645   [START ON 04/23/2022] influenza vaccine adjuvanted (FLUAD) injection 0.5 mL, 0.5 mL, Intramuscular, Tomorrow-1000, Tima Curet, MD   latanoprost (XALATAN) 0.005 % ophthalmic solution 1 drop, 1 drop, Both Eyes, QHS,  Adrian Prows, MD   [START ON 04/23/2022] levothyroxine (SYNTHROID) tablet 125 mcg, 125 mcg, Oral, QAC breakfast, Adrian Prows, MD   Derrill Memo ON 04/23/2022] losartan (COZAAR) tablet 100 mg, 100 mg, Oral, Q24H, Adrian Prows, MD   ondansetron [facility]) injection 4 mg, 4 mg, Intravenous, Q6H PRN, Adrian Prows, MD   [START ON 04/23/2022] pneumococcal 20-valent conjugate vaccine (PREVNAR 20) injection 0.5 mL, 0.5 mL, Intramuscular, Tomorrow-1000, Adrian Prows, MD   potassium chloride SA (KLOR-CON M) CR tablet 20 mEq, 20 mEq, Oral, BID, Adrian Prows, MD, 20 mEq at 04/22/22 1521   rOPINIRole (REQUIP) tablet 1 mg, 1 mg, Oral, BID, Adrian Prows, MD, 1 mg at 04/22/22 1645   sodium chloride flush (NS) 0.9 % injection 3 mL, 3 mL, Intravenous, Q12H, Adrian Prows, MD, 3 mL at 04/22/22 1612   sodium chloride flush (NS) 0.9 % injection 3 mL, 3 mL, Intravenous, PRN, Adrian Prows, MD  Radiology and Cardiac studies:   CXR 2 View 06/15/2021   COMPARISON:  December 20, 2019.   FINDINGS: Stable cardiomegaly. Minimal bibasilar subsegmental atelectasis is noted. Bony thorax is unremarkable.    IMPRESSION: Minimal bibasilar subsegmental atelectasis.  Mild cardiomegaly.  Cardiac studies:  Lexiscan myoview stress test 12/08/2017: 1. Lexiscan stress test was performed. Exercise capacity was not assessed. Stress symptoms included abdominal pain. Blood pressure was normal. The resting and stress electrocardiogram demonstrated normal sinus rhythm, normal resting conduction, no resting arrhythmias and normal rest repolarization. 2. The overall quality of the study is good. There is no evidence of abnormal lung activity. Stress and rest SPECT images demonstrate homogeneous tracer distribution throughout the myocardium. Gated SPECT imaging reveals normal myocardial thickening and wall motion. The left ventricular ejection fraction was normal (54%). 3. Low risk study   Ambulatory cardiac telemetry 14 days 03/22/2021 - 04/04/2021: Predominant underlying rhythm was sinus with first-degree AV block.  Patient with 19 brief episodes of SVT which were asymptomatic.  Frequent PVCs with 20.4% burden and episodes of ventricular bigeminy and trigeminy which correlated with patient triggered events.  No evidence of atrial fibrillation, ventricular tachycardia, pauses >3 seconds, or high degree AV block.   PCV ECHOCARDIOGRAM COMPLETE 02/08/2022   Narrative Echocardiogram 02/08/2022: Normal LV systolic function with visual EF 55-60%. Left ventricle cavity is normal in size. Moderate concentric hypertrophy of the left ventricle. Normal global wall motion. Indeterminate diastolic filling pattern. Calculated EF 56%. Left atrial cavity is severely dilated at 46.9 ml/m^2. Trileaflet aortic valve. Moderate aortic stenosis. Mild (Grade I) aortic regurgitation. Mild aortic valve leaflet calcification. AVA (VTI) measures 0.8 cm^2. AV Mean Grad measures 22.9 mmHg. AV Pk Vel measures 3.28 m/s. Mild (Grade I) mitral regurgitation. Mild calcification of the mitral valve annulus. Mild mitral valve leaflet thickening with  mild calcification. Mildly restricted mitral valve leaflets. Structurally normal tricuspid valve with trace regurgitation. No evidence of pulmonary hypertension. Compared to 04/2021, aortic stenosis is still moderate and there is mild aortic regurgitation present.  Previously noted moderate pulm hypertension not present. EKG 04/20/2022: Atrial fibrillation with rapid ventricular response at the rate of 111 bpm, normal axis, poor R progression, cannot exclude anteroseptal infarct old. No evidence of ischemia, normal QT interval. Compared to 10/07/2021, sinus rhythm has been replaced.  Assessment   1.  Acute on chronic diastolic heart failure 2.  Persistent atrial fibrillation CHA2DS2-VASc Score is 6.  Yearly risk of stroke: 10% ( A, F, HTN, CHF, Vasc Dz).  Score of 1=0.6; 2=2.2; 3=3.2; 4=4.8; 5=7.2; 6=9.8; 7=>9.8) -(CHF; HTN; vasc disease DM,  Female = 1; Age <65 =0; 65-74 = 1,  >75 =2; stroke/embolism= 2).   3.  History of GI bleed, severe anemia, history of GI bleed, last GI bleed was in March 2023 secondary to gastric ulcer requiring blood transfusion. 4.  Stage IIIb chronic kidney disease 5.  Primary hypertension 6.  Morbid obesity  Recommendations:   LADINA SHUTTERS  is a 89 y.o. female with past medical history of obesity, chronic diastolic CHF, hypertensive heart disease and chronic 3B kidney disease, paroxysmal atrial fibrillation, chronic anemia, hemorrhoids, diverticulosis, esophageal reflux, history of GI bleed needing blood transfusion in March 2023, depression, OSA intolerant to CPAP on nocturnal 02, rheumatoid arthritis, and venous insufficiency, aortic and coronary calcification, s/p AAA repair on 12/20/2019 being followed by vascular surgery.   Will start the patient on IV Lasix, 40 mg every 8 hours, 3 doses ordered.  Will reassess her fluid status and renal function tomorrow morning and depending upon that we will continue either with twice daily or 3 times daily dosing.  Strict I's  and O's will be followed along with daily weights.  Will follow-up on daily BNPs also.  She is now in persistent atrial fibrillation.  This is new onset.  She will need direct-current cardioversion to maintain diastolic function, direct-current cardioversion has been planned for Monday morning 730 on 04/25/2022.  Patient is aware of the risks and benefits and is willing to proceed.  She is presently on amiodarone, continue the same, will increase the dose to 200 mg daily for now, home dose was 100 mg daily. Continue twice.  She has chronic anemia, history of GI bleed needing blood transfusion in March 2023 last Hb although recorded at 8.7% in the hospital, outpatient labs on 02/22/2022 had revealed a hemoglobin of 11.2 with a hematocrit of 35.1.  I will add blood iron studies for tomorrow's lab.  If patient is iron deficient, will place orders for IV iron replacement.  Patient has stage IIIb chronic kidney disease, this also needs to be followed tomorrow.  Presently on Farxiga and also on Lasix, not on an ACE inhibitor or ARB due to renal function being low, worsening renal function.  Carrington Clamp could be a good option in view of renal insufficiency and heart failure.  If indeed we start Carrington Clamp, we can back down on her furosemide.  Will hold off on metolazone that was started in the outpatient basis recently.  Blood pressure is under good control, continue the same with the beta-blocker dose along with continued furosemide.  Obesity continues to be another major issue for the patient, will continue to reiterate calorie restriction.     Adrian Prows, MD, [facility] 04/22/2022, 5:43 PM Office: [phone] Fax: [phone] Pager: [phone]

## 2022-04-22 NOTE — Patient Outreach (Signed)
  Care Coordination   Follow Up Visit Note   04/22/2022 Name: Lisa Mcclure MRN: [id number] DOB: [birth year]  Lisa Mcclure is a 89 y.o. year old female who sees Lisa Lopes, MD for primary care. I reviewed patient's chart today in preparation to contact patient.   What matters to the patients health and wellness today?  Patient was admitted today to [facility] for dx: Persistent Atrial-fib    Goals Addressed             This Visit's Progress    Nurse Care Coordination Activities:further follow up needed       Care Coordination Interventions: Reviewed chart in preparation to contact patient, noted patient was admitted to [facility] today, 04/22/22; per Dr. Einar Mcclure with dx: Persistent Atrial Fib          SDOH assessments and interventions completed:  No     Care Coordination Interventions:  Yes, provided   Follow up plan: Follow up call scheduled for 04/29/22 '@12'$ :00 PM    Encounter Outcome:  Pt. Visit Completed

## 2022-04-23 LAB — BASIC METABOLIC PANEL
Anion gap: 10 (ref 5–15)
BUN: 30 mg/dL — ABNORMAL HIGH (ref 8–23)
CO2: 31 mmol/L (ref 22–32)
Calcium: 8.6 mg/dL — ABNORMAL LOW (ref 8.9–10.3)
Chloride: 98 mmol/L (ref 98–111)
Creatinine, Ser: 1.45 mg/dL — ABNORMAL HIGH (ref 0.44–1.00)
GFR, Estimated: 35 mL/min — ABNORMAL LOW (ref >60)
Glucose, Bld: 102 mg/dL — ABNORMAL HIGH (ref 70–99)
Potassium: 4.5 mmol/L (ref 3.5–5.1)
Sodium: 139 mmol/L (ref 135–145)

## 2022-04-23 LAB — CBC
HCT: 41.2 % (ref 36.0–46.0)
Hemoglobin: 12.9 g/dL (ref 12.0–15.0)
MCH: 30.9 pg (ref 26.0–34.0)
MCHC: 31.3 g/dL (ref 30.0–36.0)
MCV: 98.6 fL (ref 80.0–100.0)
Platelets: 281 10*3/uL (ref 150–400)
RBC: 4.18 MIL/uL (ref 3.87–5.11)
RDW: 14.6 % (ref 11.5–15.5)
WBC: 7.6 10*3/uL (ref 4.0–10.5)
nRBC: 0 % (ref 0.0–0.2)

## 2022-04-23 LAB — IRON AND TIBC
Iron: 37 ug/dL (ref 28–170)
Saturation Ratios: 9 % — ABNORMAL LOW (ref 10.4–31.8)
TIBC: 400 ug/dL (ref 250–450)
UIBC: 363 ug/dL

## 2022-04-23 LAB — BRAIN NATRIURETIC PEPTIDE: B Natriuretic Peptide: 1056.8 pg/mL — ABNORMAL HIGH (ref 0.0–100.0)

## 2022-04-23 MED ORDER — MELATONIN 5 MG PO TABS
10.0000 mg | ORAL_TABLET | Freq: Every day | ORAL | Status: DC
  Administered 2022-04-23 – 2022-04-24 (×2): 10 mg via ORAL
  Filled 2022-04-23 (×2): qty 2

## 2022-04-23 MED ORDER — FUROSEMIDE 10 MG/ML IJ SOLN
40.0000 mg | Freq: Three times a day (TID) | INTRAMUSCULAR | Status: AC
  Administered 2022-04-23 – 2022-04-24 (×3): 40 mg via INTRAVENOUS
  Filled 2022-04-23 (×3): qty 4

## 2022-04-23 NOTE — Progress Notes (Signed)
Subjective:  Patient seen and examined at bedside, resting comfortably. No acute events overnight. She is breathing better and feels less winded since being in the hospital.   Intake/Output from previous day:  I/O last 3 completed shifts: In: 480 [P.O.:480] Out: 3100 [Urine:3100] Total I/O In: 240 [P.O.:240] Out: 1200 [Urine:1200] Net IO Since Admission: -3,580 mL [04/23/22 1149]  Blood pressure 119/66, pulse 92, temperature (!) 97.3 F (36.3 C), temperature source Oral, resp. rate 20, height '5\' 3"'$  (1.6 m), weight 94.5 kg, SpO2 96 %. Physical Exam Vitals reviewed.  HENT:     Head: Normocephalic and atraumatic.  Neck:     Vascular: Carotid bruit present.  Cardiovascular:     Rate and Rhythm: Normal rate. Rhythm irregular.     Heart sounds: No murmur heard. Pulmonary:     Effort: Pulmonary effort is normal.  Musculoskeletal:     Right lower leg: Edema present.     Left lower leg: Edema present.  Skin:    General: Skin is warm and dry.  Neurological:     Mental Status: She is alert.     Lab Results: Lab Results  Component Value Date   NA 139 04/23/2022   K 4.5 04/23/2022   CO2 31 04/23/2022   GLUCOSE 102 (H) 04/23/2022   BUN 30 (H) 04/23/2022   CREATININE 1.45 (H) 04/23/2022   CALCIUM 8.6 (L) 04/23/2022   EGFR 52 (L) 04/13/2022   GFRNONAA 35 (L) 04/23/2022    BNP (last 3 results) Recent Labs    04/13/22 1106 04/22/22 1453 04/23/22 0030  BNP 486.9* 733.7* 1,056.8*    ProBNP (last 3 results) No results for input(s): "PROBNP" in the last 8760 hours.    Latest Ref Rng & Units 04/23/2022   12:30 AM 04/13/2022   11:07 AM 12/01/2021    3:18 PM  BMP  Glucose 70 - 99 mg/dL 102  94  100   BUN 8 - 23 mg/dL 30  24  32   Creatinine 0.44 - 1.00 mg/dL 1.45  1.04  1.62   BUN/Creat Ratio 12 - '28  23  20   '$ Sodium 135 - 145 mmol/L 139  142  140   Potassium 3.5 - 5.1 mmol/L 4.5  4.2  4.6   Chloride 98 - 111 mmol/L 98  98  97   CO2 22 - 32 mmol/L '31  30  25   '$ Calcium 8.9  - 10.3 mg/dL 8.6  8.9  8.9       Latest Ref Rng & Units 12/23/2019    2:24 PM 12/20/2019    3:20 PM 09/17/2018    5:45 AM  Hepatic Function  Total Protein 6.5 - 8.1 g/dL 7.1  6.9    Albumin 3.5 - 5.0 g/dL 2.9  3.1  3.1   AST 15 - 41 U/L 39  46    ALT 0 - 44 U/L 36  41    Alk Phosphatase 38 - 126 U/L 70  65    Total Bilirubin 0.3 - 1.2 mg/dL 0.5  0.7    Bilirubin, Direct 0.0 - 0.2 mg/dL 0.1  0.2        Latest Ref Rng & Units 04/23/2022   12:30 AM 06/21/2021    4:47 AM 06/20/2021    5:24 AM  CBC  WBC 4.0 - 10.5 K/uL 7.6  7.6  7.4   Hemoglobin 12.0 - 15.0 g/dL 12.9  8.7  8.7   Hematocrit 36.0 - 46.0 % 41.2  29.4  29.6   Platelets 150 - 400 K/uL 281  376  377    Lipid Panel     Component Value Date/Time   CHOL 126 03/14/2019 1121   TRIG 82 03/14/2019 1121   HDL 49 03/14/2019 1121   CHOLHDL 5.1 CALC 12/19/2006 0916   VLDL 26 12/19/2006 0916   LDLCALC 61 03/14/2019 1121   LDLDIRECT 159.2 12/19/2006 0916   Cardiac Panel (last 3 results) No results for input(s): "CKTOTAL", "CKMB", "TROPONINI", "RELINDX" in the last 72 hours.  HEMOGLOBIN A1C No results found for: "HGBA1C", "MPG" TSH No results for input(s): "TSH" in the last 8760 hours.  Imaging and Cardiac Studies: CXR 2 View 06/15/2021   COMPARISON:  December 20, 2019.   FINDINGS: Stable cardiomegaly. Minimal bibasilar subsegmental atelectasis is noted. Bony thorax is unremarkable.   IMPRESSION: Minimal bibasilar subsegmental atelectasis.  Mild cardiomegaly.   Cardiac studies:   Lexiscan myoview stress test 12/08/2017: 1. Lexiscan stress test was performed. Exercise capacity was not assessed. Stress symptoms included abdominal pain. Blood pressure was normal. The resting and stress electrocardiogram demonstrated normal sinus rhythm, normal resting conduction, no resting arrhythmias and normal rest repolarization. 2. The overall quality of the study is good. There is no evidence of abnormal lung activity. Stress and  rest SPECT images demonstrate homogeneous tracer distribution throughout the myocardium. Gated SPECT imaging reveals normal myocardial thickening and wall motion. The left ventricular ejection fraction was normal (54%). 3. Low risk study   Ambulatory cardiac telemetry 14 days 03/22/2021 - 04/04/2021: Predominant underlying rhythm was sinus with first-degree AV block.  Patient with 19 brief episodes of SVT which were asymptomatic.  Frequent PVCs with 20.4% burden and episodes of ventricular bigeminy and trigeminy which correlated with patient triggered events.  No evidence of atrial fibrillation, ventricular tachycardia, pauses >3 seconds, or high degree AV block.   PCV ECHOCARDIOGRAM COMPLETE 02/08/2022   Narrative Echocardiogram 02/08/2022: Normal LV systolic function with visual EF 55-60%. Left ventricle cavity is normal in size. Moderate concentric hypertrophy of the left ventricle. Normal global wall motion. Indeterminate diastolic filling pattern. Calculated EF 56%. Left atrial cavity is severely dilated at 46.9 ml/m^2. Trileaflet aortic valve. Moderate aortic stenosis. Mild (Grade I) aortic regurgitation. Mild aortic valve leaflet calcification. AVA (VTI) measures 0.8 cm^2. AV Mean Grad measures 22.9 mmHg. AV Pk Vel measures 3.28 m/s. Mild (Grade I) mitral regurgitation. Mild calcification of the mitral valve annulus. Mild mitral valve leaflet thickening with mild calcification. Mildly restricted mitral valve leaflets. Structurally normal tricuspid valve with trace regurgitation. No evidence of pulmonary hypertension. Compared to 04/2021, aortic stenosis is still moderate and there is mild aortic regurgitation present.  Previously noted moderate pulm hypertension not present. EKG 04/20/2022: Atrial fibrillation with rapid ventricular response at the rate of 111 bpm, normal axis, poor R progression, cannot exclude anteroseptal infarct old. No evidence of ischemia, normal QT interval. Compared to  10/07/2021, sinus rhythm has been replaced.   Recent Results (from the past 43800 hour(s))  ECHO TEE   Collection Time: 12/24/19  3:40 PM  Result Value   AR max vel 1.05   AV Area VTI 0.72   AV Mean grad 10.8   AV Peak grad 19.8   Ao pk vel 2.22   AV Area mean vel 1.02   Narrative      TRANSESOPHOGEAL ECHO REPORT       Patient Name:   [name] Date of Exam: 12/24/2019 Medical Rec #:  [id number]  Height:       63.5 in Accession #:    [id number]        Weight:       213.2 lb Date of Birth:  09-12-1932        BSA:          1.999 m Patient Age:    67 years          BP:           133/89 mmHg Patient Gender: F                 HR:           109 bpm. Exam Location:  Inpatient  Procedure: Transesophageal Echo, Color Doppler and Cardiac Doppler  Indications:     I48.91* Unspecified atrial fibrillation   History:         Patient has prior history of Echocardiogram examinations, most                  recent 01/27/2019. CHF; Risk Factors:Hypertension.   Sonographer:     Bernadene Person RDCS Sonographer#2:   Mikki Santee RDCS (AE) Referring Phys:  [id number] Rex Kras Diagnosing Phys: Adrian Prows MD  PROCEDURE: After discussion of the risks and benefits of a TEE, an informed consent was obtained from the patient. The transesophogeal probe was passed without difficulty through the esophogus of the patient. Local oropharyngeal anesthetic was provided  with Cetacaine. Sedation performed by different physician. The patient was monitored while under deep sedation. Anesthestetic sedation was provided intravenously by Anesthesiology: 67.'74mg'$  of Propofol, '60mg'$  of Lidocaine. The patient developed no  complications during the procedure. A successful direct current cardioversion was performed at 200 joules with 3 attempts. 120x1, 150x1 followed by 200 joules of electricity X 1 with success from A. Fib to NSR.  IMPRESSIONS    1. Left ventricular ejection fraction, by estimation, is  50 to 55%. The left ventricle has low normal function. Left ventricular diastolic function could not be evaluated.  2. Right ventricular systolic function is normal. The right ventricular size is normal.  3. Left atrial size was moderately dilated. No left atrial/left atrial appendage thrombus was detected. The LAA emptying velocity was 30 cm/s.  4. The mitral valve is normal in structure. Mild mitral valve regurgitation. No evidence of mitral stenosis.  5. The aortic valve is calcified. Aortic valve regurgitation is not visualized. Mild aortic valve stenosis. Aortic valve mean gradient measures 10.8 mmHg.  6. There is Moderate (Grade III) layered plaque involving the ascending, transverse and descending aorta.  FINDINGS  Left Ventricle: Left ventricular ejection fraction, by estimation, is 50 to 55%. The left ventricle has low normal function. The left ventricular internal cavity size was normal in size. Left ventricular diastolic function could not be evaluated.  Right Ventricle: The right ventricular size is normal. No increase in right ventricular wall thickness. Right ventricular systolic function is normal.  Left Atrium: Left atrial size was moderately dilated. No left atrial/left atrial appendage thrombus was detected. The LAA emptying velocity was 30 cm/s.  Right Atrium: Right atrial size was normal in size.  Pericardium: There is no evidence of pericardial effusion.  Mitral Valve: The mitral valve is normal in structure. Mild mitral annular calcification. Mild mitral valve regurgitation. No evidence of mitral valve stenosis.  Tricuspid Valve: The tricuspid valve is normal in structure. Tricuspid valve regurgitation is trivial.  Aortic Valve: The aortic valve is calcified. Aortic valve regurgitation is not visualized. Mild  aortic stenosis is present. Aortic valve mean gradient measures 10.8 mmHg. Aortic valve peak gradient measures 19.8 mmHg. Aortic valve area, by VTI measures  0.72  cm.  Pulmonic Valve: The pulmonic valve was grossly normal. Pulmonic valve regurgitation is not visualized.  Aorta: The aortic root, ascending aorta and aortic arch are all structurally normal, with no evidence of dilitation or obstruction. There is moderate (Grade III) layered plaque involving the ascending, transverse and descending aorta.  IAS/Shunts: No atrial level shunt detected by color flow Doppler.    LEFT VENTRICLE PLAX 2D LVOT diam:     1.90 cm LV SV:         30 LV SV Index:   15 LVOT Area:     2.84 cm    AORTIC VALVE AV Area (Vmax):    1.05 cm AV Area (Vmean):   1.02 cm AV Area (VTI):     0.72 cm AV Vmax:           222.25 cm/s AV Vmean:          153.750 cm/s AV VTI:            0.422 m AV Peak Grad:      19.8 mmHg AV Mean Grad:      10.8 mmHg LVOT Vmax:         82.50 cm/s LVOT Vmean:        55.500 cm/s LVOT VTI:          0.107 m LVOT/AV VTI ratio: 0.25    SHUNTS Systemic VTI:  0.11 m Systemic Diam: 1.90 cm  Adrian Prows MD Electronically signed by Adrian Prows MD Signature Date/Time: 12/28/2019/7:26:24 AM       Final     *Note: Due to a large number of results and/or encounters for the requested time period, some results have not been displayed. A complete set of results can be found in Results Review.    Scheduled Meds:  amiodarone  200 mg Oral q morning   apixaban  2.5 mg Oral BID   atenolol  50 mg Oral Daily   busPIRone  7.5 mg Oral TID   dapagliflozin propanediol  10 mg Oral QAC breakfast   furosemide  40 mg Intravenous Q8H   influenza vaccine adjuvanted  0.5 mL Intramuscular Tomorrow-1000   latanoprost  1 drop Both Eyes QHS   levothyroxine  125 mcg Oral QAC breakfast   losartan  100 mg Oral Q24H   pneumococcal 20-valent conjugate vaccine  0.5 mL Intramuscular Tomorrow-1000   potassium chloride  20 mEq Oral BID   rOPINIRole  1 mg Oral BID   sodium chloride flush  3 mL Intravenous Q12H   Continuous Infusions:  sodium chloride     PRN  Meds:.sodium chloride, acetaminophen, albuterol, HYDROcodone-acetaminophen, ondansetron (ZOFRAN) IV, sodium chloride flush  Assessment & Plan: HAYES REHFELDT is a 89 y.o. female patient with past medical history of obesity, chronic diastolic CHF, hypertensive heart disease and chronic 3B kidney disease, paroxysmal atrial fibrillation, chronic anemia, hemorrhoids, diverticulosis, esophageal reflux, history of GI bleed needing blood transfusion in March 2023, depression, OSA intolerant to CPAP on nocturnal 02, rheumatoid arthritis, and venous insufficiency, aortic and coronary calcification, s/p AAA repair on 12/20/2019 being followed by vascular surgery.    Acute on chronic diastolic heart failure Continue Lasix 40 mg IV TID, will reassess tomorrow morning. Likely will switch to IV BID tomorrow. Strict I's and O's will be followed along with daily weights.   Will follow-up  on daily BNPs also.    Persistent atrial fibrillation CHA2DS2-VASc Score is 6.  Yearly risk of stroke: 10% ( A, F, HTN, CHF, Vasc Dz).  Score of 1=0.6; 2=2.2; 3=3.2; 4=4.8; 5=7.2; 6=9.8; 7=>9.8) -(CHF; HTN; vasc disease DM,  Female = 1; Age <65 =0; 65-74 = 1,  >75 =2; stroke/embolism= 2).    She is now in persistent atrial fibrillation, rate controlled.   Direct-current cardioversion has been planned for Monday morning 730 on 04/25/2022.  Patient is aware of the risks and benefits and is willing to proceed.   Continue PO amiodarone and Eliquis   History of GI bleed, severe anemia, history of GI bleed, last GI bleed was in March 2023 secondary to gastric ulcer requiring blood transfusion. Iron  studies within normal limits, does not require replacement at this time Will continue to monitor hgb, patient has not had any bleeding this admission   Stage IIIb chronic kidney disease Creatinine remains stable, continue checking daily BMP Presently on Farxiga and also on Lasix, not on an ACE inhibitor or ARB due to renal  function being low, worsening renal function.  Carrington Clamp could be a good option in view of renal insufficiency and heart failure. If indeed we start Carrington Clamp, we can back down on her furosemide. Will hold off on metolazone that was started in the outpatient basis recent    Primary hypertension Blood pressure is under good control, continue the same with the beta-blocker dose along with continued furosemide.    Morbid obesity Obesity continues to be another major issue for the patient, will continue to reiterate calorie restriction.      Floydene Flock, DO, [facility] 04/23/2022, 11:49 AM Office: [phone] Fax: [phone] Pager: [phone]

## 2022-04-23 NOTE — Plan of Care (Signed)
  Problem: Elimination: Goal: Will not experience complications related to urinary retention Outcome: Progressing   Problem: Activity: Goal: Capacity to carry out activities will improve Outcome: Progressing

## 2022-04-24 LAB — CBC
HCT: 43.3 % (ref 36.0–46.0)
Hemoglobin: 13.3 g/dL (ref 12.0–15.0)
MCH: 30.2 pg (ref 26.0–34.0)
MCHC: 30.7 g/dL (ref 30.0–36.0)
MCV: 98.4 fL (ref 80.0–100.0)
Platelets: 296 10*3/uL (ref 150–400)
RBC: 4.4 MIL/uL (ref 3.87–5.11)
RDW: 14.4 % (ref 11.5–15.5)
WBC: 10.2 10*3/uL (ref 4.0–10.5)
nRBC: 0 % (ref 0.0–0.2)

## 2022-04-24 LAB — BASIC METABOLIC PANEL
Anion gap: 9 (ref 5–15)
BUN: 39 mg/dL — ABNORMAL HIGH (ref 8–23)
CO2: 32 mmol/L (ref 22–32)
Calcium: 8.4 mg/dL — ABNORMAL LOW (ref 8.9–10.3)
Chloride: 95 mmol/L — ABNORMAL LOW (ref 98–111)
Creatinine, Ser: 1.38 mg/dL — ABNORMAL HIGH (ref 0.44–1.00)
GFR, Estimated: 37 mL/min — ABNORMAL LOW (ref >60)
Glucose, Bld: 108 mg/dL — ABNORMAL HIGH (ref 70–99)
Potassium: 4.2 mmol/L (ref 3.5–5.1)
Sodium: 136 mmol/L (ref 135–145)

## 2022-04-24 LAB — BRAIN NATRIURETIC PEPTIDE: B Natriuretic Peptide: 855.1 pg/mL — ABNORMAL HIGH (ref 0.0–100.0)

## 2022-04-24 MED ORDER — ROPINIROLE HCL 1 MG PO TABS
1.0000 mg | ORAL_TABLET | Freq: Two times a day (BID) | ORAL | Status: DC
  Administered 2022-04-24 – 2022-04-25 (×3): 1 mg via ORAL
  Filled 2022-04-24 (×2): qty 1

## 2022-04-24 MED ORDER — FUROSEMIDE 10 MG/ML IJ SOLN
INTRAMUSCULAR | Status: AC
  Filled 2022-04-24: qty 4

## 2022-04-24 MED ORDER — FUROSEMIDE 10 MG/ML IJ SOLN
40.0000 mg | Freq: Two times a day (BID) | INTRAMUSCULAR | Status: DC
  Administered 2022-04-24 – 2022-04-25 (×2): 40 mg via INTRAVENOUS
  Filled 2022-04-24 (×2): qty 4

## 2022-04-24 NOTE — Progress Notes (Signed)
Patient has urinary incontinence with standing that dribbles to the floor, so she has had several unmeasurable voids. Purewick has not been successful and patient is unable to hold her urine.

## 2022-04-24 NOTE — Progress Notes (Signed)
Subjective:  Patient seen and examined at bedside, resting comfortably. No acute events overnight. She required supplemental oxygen overnight but she does wear oxygen regularly at home while she sleeps. Otherwise, she is feeling well.   Intake/Output from previous day:  I/O last 3 completed shifts: In: 1560 [P.O.:1560] Out: 7793 [Urine:6150] Total I/O In: 3 [I.V.:3] Out: -  Net IO Since Admission: -5,387 mL [04/24/22 1029]  Blood pressure (!) 105/52, pulse 69, temperature (!) 97.5 F (36.4 C), temperature source Oral, resp. rate (!) 21, height '5\' 3"'$  (1.6 m), weight 88.2 kg, SpO2 96 %. Physical Exam Vitals reviewed.  HENT:     Head: Normocephalic and atraumatic.  Neck:     Vascular: Carotid bruit present.  Cardiovascular:     Rate and Rhythm: Normal rate. Rhythm irregular.     Heart sounds: No murmur heard. Pulmonary:     Effort: Pulmonary effort is normal.  Musculoskeletal:     Right lower leg: Edema present.     Left lower leg: Edema present.  Skin:    General: Skin is warm and dry.  Neurological:     Mental Status: She is alert.     Lab Results: Lab Results  Component Value Date   NA 136 04/24/2022   K 4.2 04/24/2022   CO2 32 04/24/2022   GLUCOSE 108 (H) 04/24/2022   BUN 39 (H) 04/24/2022   CREATININE 1.38 (H) 04/24/2022   CALCIUM 8.4 (L) 04/24/2022   EGFR 52 (L) 04/13/2022   GFRNONAA 37 (L) 04/24/2022    BNP (last 3 results) Recent Labs    04/22/22 1453 04/23/22 0030 04/24/22 0050  BNP 733.7* 1,056.8* 855.1*     ProBNP (last 3 results) No results for input(s): "PROBNP" in the last 8760 hours.    Latest Ref Rng & Units 04/24/2022   12:50 AM 04/23/2022   12:30 AM 04/13/2022   11:07 AM  BMP  Glucose 70 - 99 mg/dL 108  102  94   BUN 8 - 23 mg/dL 39  30  24   Creatinine 0.44 - 1.00 mg/dL 1.38  1.45  1.04   BUN/Creat Ratio 12 - 28   23   Sodium 135 - 145 mmol/L 136  139  142   Potassium 3.5 - 5.1 mmol/L 4.2  4.5  4.2   Chloride 98 - 111 mmol/L 95  98   98   CO2 22 - 32 mmol/L 32  31  30   Calcium 8.9 - 10.3 mg/dL 8.4  8.6  8.9       Latest Ref Rng & Units 12/23/2019    2:24 PM 12/20/2019    3:20 PM 09/17/2018    5:45 AM  Hepatic Function  Total Protein 6.5 - 8.1 g/dL 7.1  6.9    Albumin 3.5 - 5.0 g/dL 2.9  3.1  3.1   AST 15 - 41 U/L 39  46    ALT 0 - 44 U/L 36  41    Alk Phosphatase 38 - 126 U/L 70  65    Total Bilirubin 0.3 - 1.2 mg/dL 0.5  0.7    Bilirubin, Direct 0.0 - 0.2 mg/dL 0.1  0.2        Latest Ref Rng & Units 04/23/2022   12:30 AM 06/21/2021    4:47 AM 06/20/2021    5:24 AM  CBC  WBC 4.0 - 10.5 K/uL 7.6  7.6  7.4   Hemoglobin 12.0 - 15.0 g/dL 12.9  8.7  8.7  Hematocrit 36.0 - 46.0 % 41.2  29.4  29.6   Platelets 150 - 400 K/uL 281  376  377    Lipid Panel     Component Value Date/Time   CHOL 126 03/14/2019 1121   TRIG 82 03/14/2019 1121   HDL 49 03/14/2019 1121   CHOLHDL 5.1 CALC 12/19/2006 0916   VLDL 26 12/19/2006 0916   LDLCALC 61 03/14/2019 1121   LDLDIRECT 159.2 12/19/2006 0916   Cardiac Panel (last 3 results) No results for input(s): "CKTOTAL", "CKMB", "TROPONINI", "RELINDX" in the last 72 hours.  HEMOGLOBIN A1C No results found for: "HGBA1C", "MPG" TSH No results for input(s): "TSH" in the last 8760 hours.  Imaging and Cardiac Studies: CXR 2 View 06/15/2021   COMPARISON:  December 20, 2019.   FINDINGS: Stable cardiomegaly. Minimal bibasilar subsegmental atelectasis is noted. Bony thorax is unremarkable.   IMPRESSION: Minimal bibasilar subsegmental atelectasis.  Mild cardiomegaly.   Cardiac studies:   Lexiscan myoview stress test 12/08/2017: 1. Lexiscan stress test was performed. Exercise capacity was not assessed. Stress symptoms included abdominal pain. Blood pressure was normal. The resting and stress electrocardiogram demonstrated normal sinus rhythm, normal resting conduction, no resting arrhythmias and normal rest repolarization. 2. The overall quality of the study is good. There is  no evidence of abnormal lung activity. Stress and rest SPECT images demonstrate homogeneous tracer distribution throughout the myocardium. Gated SPECT imaging reveals normal myocardial thickening and wall motion. The left ventricular ejection fraction was normal (54%). 3. Low risk study   Ambulatory cardiac telemetry 14 days 03/22/2021 - 04/04/2021: Predominant underlying rhythm was sinus with first-degree AV block.  Patient with 19 brief episodes of SVT which were asymptomatic.  Frequent PVCs with 20.4% burden and episodes of ventricular bigeminy and trigeminy which correlated with patient triggered events.  No evidence of atrial fibrillation, ventricular tachycardia, pauses >3 seconds, or high degree AV block.   PCV ECHOCARDIOGRAM COMPLETE 02/08/2022   Narrative Echocardiogram 02/08/2022: Normal LV systolic function with visual EF 55-60%. Left ventricle cavity is normal in size. Moderate concentric hypertrophy of the left ventricle. Normal global wall motion. Indeterminate diastolic filling pattern. Calculated EF 56%. Left atrial cavity is severely dilated at 46.9 ml/m^2. Trileaflet aortic valve. Moderate aortic stenosis. Mild (Grade I) aortic regurgitation. Mild aortic valve leaflet calcification. AVA (VTI) measures 0.8 cm^2. AV Mean Grad measures 22.9 mmHg. AV Pk Vel measures 3.28 m/s. Mild (Grade I) mitral regurgitation. Mild calcification of the mitral valve annulus. Mild mitral valve leaflet thickening with mild calcification. Mildly restricted mitral valve leaflets. Structurally normal tricuspid valve with trace regurgitation. No evidence of pulmonary hypertension. Compared to 04/2021, aortic stenosis is still moderate and there is mild aortic regurgitation present.  Previously noted moderate pulm hypertension not present. EKG 04/20/2022: Atrial fibrillation with rapid ventricular response at the rate of 111 bpm, normal axis, poor R progression, cannot exclude anteroseptal infarct old. No  evidence of ischemia, normal QT interval. Compared to 10/07/2021, sinus rhythm has been replaced.   Recent Results (from the past 43800 hour(s))  ECHO TEE   Collection Time: 12/24/19  3:40 PM  Result Value   AR max vel 1.05   AV Area VTI 0.72   AV Mean grad 10.8   AV Peak grad 19.8   Ao pk vel 2.22   AV Area mean vel 1.02   Narrative      TRANSESOPHOGEAL ECHO REPORT       Patient Name:   Lisa Mcclure Date of Exam: 12/24/2019 Medical  Rec #:  [id number]         Height:       63.5 in Accession #:    [id number]        Weight:       213.2 lb Date of Birth:  12/03/1932        BSA:          1.999 m Patient Age:    89 years          BP:           133/89 mmHg Patient Gender: F                 HR:           109 bpm. Exam Location:  Inpatient  Procedure: Transesophageal Echo, Color Doppler and Cardiac Doppler  Indications:     I48.91* Unspecified atrial fibrillation   History:         Patient has prior history of Echocardiogram examinations, most                  recent 01/27/2019. CHF; Risk Factors:Hypertension.   Sonographer:     Bernadene Person RDCS Sonographer#2:   Mikki Santee RDCS (AE) Referring Phys:  [id number] Rex Kras Diagnosing Phys: Adrian Prows MD  PROCEDURE: After discussion of the risks and benefits of a TEE, an informed consent was obtained from the patient. The transesophogeal probe was passed without difficulty through the esophogus of the patient. Local oropharyngeal anesthetic was provided  with Cetacaine. Sedation performed by different physician. The patient was monitored while under deep sedation. Anesthestetic sedation was provided intravenously by Anesthesiology: 67.'74mg'$  of Propofol, '60mg'$  of Lidocaine. The patient developed no  complications during the procedure. A successful direct current cardioversion was performed at 200 joules with 3 attempts. 120x1, 150x1 followed by 200 joules of electricity X 1 with success from A. Fib to NSR.  IMPRESSIONS    1.  Left ventricular ejection fraction, by estimation, is 50 to 55%. The left ventricle has low normal function. Left ventricular diastolic function could not be evaluated.  2. Right ventricular systolic function is normal. The right ventricular size is normal.  3. Left atrial size was moderately dilated. No left atrial/left atrial appendage thrombus was detected. The LAA emptying velocity was 30 cm/s.  4. The mitral valve is normal in structure. Mild mitral valve regurgitation. No evidence of mitral stenosis.  5. The aortic valve is calcified. Aortic valve regurgitation is not visualized. Mild aortic valve stenosis. Aortic valve mean gradient measures 10.8 mmHg.  6. There is Moderate (Grade III) layered plaque involving the ascending, transverse and descending aorta.  FINDINGS  Left Ventricle: Left ventricular ejection fraction, by estimation, is 50 to 55%. The left ventricle has low normal function. The left ventricular internal cavity size was normal in size. Left ventricular diastolic function could not be evaluated.  Right Ventricle: The right ventricular size is normal. No increase in right ventricular wall thickness. Right ventricular systolic function is normal.  Left Atrium: Left atrial size was moderately dilated. No left atrial/left atrial appendage thrombus was detected. The LAA emptying velocity was 30 cm/s.  Right Atrium: Right atrial size was normal in size.  Pericardium: There is no evidence of pericardial effusion.  Mitral Valve: The mitral valve is normal in structure. Mild mitral annular calcification. Mild mitral valve regurgitation. No evidence of mitral valve stenosis.  Tricuspid Valve: The tricuspid valve is normal in structure. Tricuspid valve regurgitation is trivial.  Aortic Valve:  The aortic valve is calcified. Aortic valve regurgitation is not visualized. Mild aortic stenosis is present. Aortic valve mean gradient measures 10.8 mmHg. Aortic valve peak gradient measures  19.8 mmHg. Aortic valve area, by VTI measures  0.72 cm.  Pulmonic Valve: The pulmonic valve was grossly normal. Pulmonic valve regurgitation is not visualized.  Aorta: The aortic root, ascending aorta and aortic arch are all structurally normal, with no evidence of dilitation or obstruction. There is moderate (Grade III) layered plaque involving the ascending, transverse and descending aorta.  IAS/Shunts: No atrial level shunt detected by color flow Doppler.    LEFT VENTRICLE PLAX 2D LVOT diam:     1.90 cm LV SV:         30 LV SV Index:   15 LVOT Area:     2.84 cm    AORTIC VALVE AV Area (Vmax):    1.05 cm AV Area (Vmean):   1.02 cm AV Area (VTI):     0.72 cm AV Vmax:           222.25 cm/s AV Vmean:          153.750 cm/s AV VTI:            0.422 m AV Peak Grad:      19.8 mmHg AV Mean Grad:      10.8 mmHg LVOT Vmax:         82.50 cm/s LVOT Vmean:        55.500 cm/s LVOT VTI:          0.107 m LVOT/AV VTI ratio: 0.25    SHUNTS Systemic VTI:  0.11 m Systemic Diam: 1.90 cm  Adrian Prows MD Electronically signed by Adrian Prows MD Signature Date/Time: 12/28/2019/7:26:24 AM       Final     *Note: Due to a large number of results and/or encounters for the requested time period, some results have not been displayed. A complete set of results can be found in Results Review.    Scheduled Meds:  amiodarone  200 mg Oral q morning   apixaban  2.5 mg Oral BID   atenolol  50 mg Oral Daily   busPIRone  7.5 mg Oral TID   dapagliflozin propanediol  10 mg Oral QAC breakfast   furosemide       furosemide  40 mg Intravenous BID   influenza vaccine adjuvanted  0.5 mL Intramuscular Tomorrow-1000   latanoprost  1 drop Both Eyes QHS   levothyroxine  125 mcg Oral QAC breakfast   losartan  100 mg Oral Q24H   melatonin  10 mg Oral QHS   pneumococcal 20-valent conjugate vaccine  0.5 mL Intramuscular Tomorrow-1000   potassium chloride  20 mEq Oral BID   rOPINIRole  1 mg Oral BID    sodium chloride flush  3 mL Intravenous Q12H   Continuous Infusions:  sodium chloride     PRN Meds:.sodium chloride, acetaminophen, albuterol, furosemide, HYDROcodone-acetaminophen, ondansetron (ZOFRAN) IV, sodium chloride flush  Assessment & Plan: Lisa Mcclure is a 89 y.o. female patient with past medical history of obesity, chronic diastolic CHF, hypertensive heart disease and chronic 3B kidney disease, paroxysmal atrial fibrillation, chronic anemia, hemorrhoids, diverticulosis, esophageal reflux, history of GI bleed needing blood transfusion in March 2023, depression, OSA intolerant to CPAP on nocturnal 02, rheumatoid arthritis, and venous insufficiency, aortic and coronary calcification, s/p AAA repair on 12/20/2019 being followed by vascular surgery.    Acute on chronic diastolic heart failure Switching to Lasix 40  mg IV BID. Strict I's and O's will be followed along with daily weights.   BNP improved this morning and Cr stable.    Persistent atrial fibrillation CHA2DS2-VASc Score is 6.  Yearly risk of stroke: 10% ( A, F, HTN, CHF, Vasc Dz).  Score of 1=0.6; 2=2.2; 3=3.2; 4=4.8; 5=7.2; 6=9.8; 7=>9.8) -(CHF; HTN; vasc disease DM,  Female = 1; Age <65 =0; 65-74 = 1,  >75 =2; stroke/embolism= 2).    She is now in persistent atrial fibrillation, rate controlled.   Direct-current cardioversion has been planned for Monday morning 730 on 04/25/2022.  Patient is aware of the risks and benefits and is willing to proceed.   Continue PO amiodarone and Eliquis   History of GI bleed, severe anemia, history of GI bleed, last GI bleed was in March 2023 secondary to gastric ulcer requiring blood transfusion. Iron studies within normal limits, does not require replacement at this time Will continue to monitor hgb, patient has not had any bleeding this admission    Stage IIIb chronic kidney disease Creatinine remains stable, continue checking daily BMP Presently on Farxiga and also on Lasix,  not on an ACE inhibitor or ARB due to renal function being low, worsening renal function.  Will hold off on metolazone that was started in the outpatient basis recent    Primary hypertension Blood pressure is under good control, continue the same with the beta-blocker dose along with continued furosemide.    Morbid obesity Obesity continues to be another major issue for the patient, will continue to reiterate calorie restriction.      Floydene Flock, DO, [facility] 04/24/2022, 10:29 AM Office: [phone] Fax: [phone] Pager: [phone]

## 2022-04-24 NOTE — Anesthesia Preprocedure Evaluation (Signed)
Anesthesia Evaluation  Patient identified by MRN, date of birth, ID band Patient awake    Reviewed: Allergy & Precautions, H&P , NPO status , Patient's Chart, lab work & pertinent test results  Airway Mallampati: II  TM Distance: >3 FB Neck ROM: Full    Dental no notable dental hx. (+) Teeth Intact, Dental Advisory Given   Pulmonary sleep apnea , COPD,  COPD inhaler, former smoker   Pulmonary exam normal breath sounds clear to auscultation       Cardiovascular Exercise Tolerance: Good hypertension, Pt. on medications and Pt. on home beta blockers +CHF and + DOE  + dysrhythmias Atrial Fibrillation  Rhythm:Irregular Rate:Normal     Neuro/Psych   Anxiety Depression    negative neurological ROS     GI/Hepatic Neg liver ROS,GERD  ,,  Endo/Other  Hypothyroidism    Renal/GU negative Renal ROS  negative genitourinary   Musculoskeletal  (+) Arthritis ,    Abdominal   Peds  Hematology  (+) Blood dyscrasia, anemia   Anesthesia Other Findings   Reproductive/Obstetrics negative OB ROS                             Anesthesia Physical Anesthesia Plan  ASA: 3  Anesthesia Plan: General   Post-op Pain Management: Minimal or no pain anticipated   Induction: Intravenous  PONV Risk Score and Plan: 3 and Propofol infusion and Treatment may vary due to age or medical condition  Airway Management Planned: Mask  Additional Equipment:   Intra-op Plan:   Post-operative Plan:   Informed Consent: I have reviewed the patients History and Physical, chart, labs and discussed the procedure including the risks, benefits and alternatives for the proposed anesthesia with the patient or authorized representative who has indicated his/her understanding and acceptance.   Patient has DNR.  Discussed DNR with patient and Suspend DNR.   Dental advisory given  Plan Discussed with: CRNA  Anesthesia Plan  Comments:        Anesthesia Quick Evaluation

## 2022-04-25 ENCOUNTER — Inpatient Hospital Stay (HOSPITAL_COMMUNITY): Payer: Medicare Other | Admitting: Anesthesiology

## 2022-04-25 ENCOUNTER — Encounter (HOSPITAL_COMMUNITY)
Admission: RE | Disposition: A | Discharge status: Home Health | Payer: Self-pay | Source: Ambulatory Visit | Attending: Cardiology

## 2022-04-25 ENCOUNTER — Encounter (HOSPITAL_COMMUNITY): Payer: Self-pay | Admitting: Cardiology

## 2022-04-25 DIAGNOSIS — D638 Anemia in other chronic diseases classified elsewhere: Secondary | ICD-10-CM

## 2022-04-25 DIAGNOSIS — E669 Obesity, unspecified: Secondary | ICD-10-CM

## 2022-04-25 DIAGNOSIS — I5032 Chronic diastolic (congestive) heart failure: Secondary | ICD-10-CM

## 2022-04-25 DIAGNOSIS — I110 Hypertensive heart disease with heart failure: Secondary | ICD-10-CM

## 2022-04-25 DIAGNOSIS — I509 Heart failure, unspecified: Secondary | ICD-10-CM

## 2022-04-25 DIAGNOSIS — Z87891 Personal history of nicotine dependence: Secondary | ICD-10-CM

## 2022-04-25 DIAGNOSIS — E039 Hypothyroidism, unspecified: Secondary | ICD-10-CM

## 2022-04-25 DIAGNOSIS — I4891 Unspecified atrial fibrillation: Secondary | ICD-10-CM

## 2022-04-25 DIAGNOSIS — I5033 Acute on chronic diastolic (congestive) heart failure: Secondary | ICD-10-CM

## 2022-04-25 DIAGNOSIS — N1832 Chronic kidney disease, stage 3b: Secondary | ICD-10-CM

## 2022-04-25 HISTORY — PX: CARDIOVERSION: SHX1299

## 2022-04-25 LAB — BASIC METABOLIC PANEL
Anion gap: 12 (ref 5–15)
BUN: 40 mg/dL — ABNORMAL HIGH (ref 8–23)
CO2: 30 mmol/L (ref 22–32)
Calcium: 8.3 mg/dL — ABNORMAL LOW (ref 8.9–10.3)
Chloride: 95 mmol/L — ABNORMAL LOW (ref 98–111)
Creatinine, Ser: 1.63 mg/dL — ABNORMAL HIGH (ref 0.44–1.00)
GFR, Estimated: 30 mL/min — ABNORMAL LOW (ref >60)
Glucose, Bld: 98 mg/dL (ref 70–99)
Potassium: 4.3 mmol/L (ref 3.5–5.1)
Sodium: 137 mmol/L (ref 135–145)

## 2022-04-25 SURGERY — CARDIOVERSION
Anesthesia: General

## 2022-04-25 MED ORDER — PROPOFOL 10 MG/ML IV BOLUS
INTRAVENOUS | Status: DC | PRN
  Administered 2022-04-25: 50 mg via INTRAVENOUS

## 2022-04-25 MED ORDER — LOSARTAN POTASSIUM 100 MG PO TABS: 50.0000 mg | ORAL_TABLET | Freq: Every day | ORAL | 3 refills | Status: DC

## 2022-04-25 MED ORDER — FUROSEMIDE 20 MG PO TABS: 40.0000 mg | ORAL_TABLET | Freq: Every day | ORAL | 0 refills | Status: DC | PRN

## 2022-04-25 MED ORDER — LIDOCAINE 2% (20 MG/ML) 5 ML SYRINGE
INTRAMUSCULAR | Status: DC | PRN
  Administered 2022-04-25: 50 mg via INTRAVENOUS

## 2022-04-25 MED ORDER — AMIODARONE HCL 100 MG PO TABS: 100.0000 mg | ORAL_TABLET | Freq: Every day | ORAL | 5 refills | Status: DC

## 2022-04-25 MED ORDER — GUAIFENESIN 100 MG/5ML PO LIQD
5.0000 mL | ORAL | Status: DC | PRN
  Administered 2022-04-25: 5 mL via ORAL
  Filled 2022-04-25: qty 10

## 2022-04-25 MED ORDER — ATENOLOL 25 MG PO TABS: 25.0000 mg | ORAL_TABLET | Freq: Every day | ORAL | 1 refills | Status: DC

## 2022-04-25 NOTE — Interval H&P Note (Signed)
History and Physical Interval Note:  04/25/2022 6:51 AM  Lisa Mcclure  has presented today for surgery, with the diagnosis of atrial fibrillation.  The various methods of treatment have been discussed with the patient and family. After consideration of risks, benefits and other options for treatment, the patient has consented to  Procedure(s): CARDIOVERSION (N/A) as a surgical intervention.  The patient's history has been reviewed, patient examined, no change in status, stable for surgery.  I have reviewed the patient's chart and labs.  Questions were answered to the patient's satisfaction.     Adrian Prows

## 2022-04-25 NOTE — Discharge Summary (Addendum)
Physician Discharge Summary  [name] MRN: [id number] DOB/AGE: May 30, 1932 89 y.o. Lisa Lopes, MD   Admit date: 04/22/2022 Discharge date: 04/25/2022  Primary Discharge Diagnosis 1.  Acute on chronic diastolic heart failure 2.  Persistent atrial fibrillation 3.  Primary hypertension 4.  Chronic stage IIIb kidney disease 5.  Hypercoagulable state secondary to atrial fibrillation presently on Eliquis  Significant Diagnostic Studies:  PCV ECHOCARDIOGRAM COMPLETE 02/08/2022   Narrative Echocardiogram 02/08/2022: Normal LV systolic function with visual EF 55-60%. Left ventricle cavity is normal in size. Moderate concentric hypertrophy of the left ventricle. Normal global wall motion. Indeterminate diastolic filling pattern. Calculated EF 56%. Left atrial cavity is severely dilated at 46.9 ml/m^2. Trileaflet aortic valve. Moderate aortic stenosis. Mild (Grade I) aortic regurgitation. Mild aortic valve leaflet calcification. AVA (VTI) measures 0.8 cm^2. AV Mean Grad measures 22.9 mmHg. AV Pk Vel measures 3.28 m/s. Mild (Grade I) mitral regurgitation. Mild calcification of the mitral valve annulus. Mild mitral valve leaflet thickening with mild calcification. Mildly restricted mitral valve leaflets. Structurally normal tricuspid valve with trace regurgitation. No evidence of pulmonary hypertension. Compared to 04/2021, aortic stenosis is still moderate and there is mild aortic regurgitation present.  Previously noted moderate pulm hypertension not present. EKG 04/20/2022: Atrial fibrillation with rapid ventricular response at the rate of 111 bpm, normal axis, poor R progression, cannot exclude anteroseptal infarct old. No evidence of ischemia, normal QT interval. Compared to 10/07/2021, sinus rhythm has been replaced.   Radiology: No results found.  Hospital Course: Lisa Mcclure  is a 89 y.o. female with past medical history of obesity, chronic diastolic CHF,  hypertensive heart disease and chronic 3B kidney disease, paroxysmal atrial fibrillation, chronic anemia, hemorrhoids, diverticulosis, esophageal reflux, history of GI bleed needing blood transfusion in March 2023, depression, OSA intolerant to CPAP on nocturnal 02, rheumatoid arthritis, and venous insufficiency, aortic and coronary calcification, s/p AAA repair on 12/20/2019 being followed by vascular surgery.    She been having worsening symptoms of dyspnea, worsening leg edema and inability to do activities of daily living and was in class IV heart failure.  She was admitted to the hospital on 02/21/2023 and discharged on 02/23/2022, started on IV diuretics and patient diuresed about 5.2 L of fluid, she had resolution of her leg edema and dyspnea improved.  She underwent direct-current cardioversion on 04/25/2022 with successful cardioversion to sinus rhythm with 150 J x 1.  Recommendations on discharge: Strict diet discussed with the patient, salt restriction and fluid restriction discussed with the patient, should be discharged home on furosemide on a as needed basis, will hold off on metolazone, continue anticoagulation with Eliquis for persistent atrial fibrillation which is now converted to sinus rhythm.  I will see her back as a TOC visit in 1 week for close monitoring of her heart failure.  Bilateral shin appear mildly inflamed, early cellulitis cannot be excluded but appears less likely, most probably from significant leg edema.  This has to be watched closely to exclude development of cellulitis.  Home health care was also set up with RN with that and PT visit.  Amiodarone dose reduced to 100 mg daily from 200 mg daily and atenolol dose reduced from 50 mg to 25 mg daily in view of bradycardia post cardioversion.  Discharge Exam:    04/25/2022    9:20 AM 04/25/2022    8:42 AM 04/25/2022    8:10 AM  Vitals with BMI  Systolic 660 630 160  Diastolic 57  61 61  Pulse 47 43 45     Physical  Exam Constitutional:      Comments: Morbidly obese in no acute distress.  Neck:     Vascular: Carotid bruit present. No JVD.  Cardiovascular:     Rate and Rhythm: Normal rate and regular rhythm.     Pulses: Intact distal pulses.          Carotid pulses are 2+ on the right side and 2+ on the left side.    Heart sounds: Normal heart sounds. No murmur heard.    No gallop.  Pulmonary:     Effort: Pulmonary effort is normal.     Breath sounds: Normal breath sounds.  Abdominal:     General: Bowel sounds are normal.     Palpations: Abdomen is soft.     Comments: Obese. Pannus present  Musculoskeletal:     Right lower leg: No edema.     Left lower leg: No edema.  Skin:    Comments: Bilateral shin appear mildly inflamed, early cellulitis cannot be excluded but appears less likely, most probably from significant leg edema.      Labs:   Lab Results  Component Value Date   WBC 10.2 04/24/2022   HGB 13.3 04/24/2022   HCT 43.3 04/24/2022   MCV 98.4 04/24/2022   PLT 296 04/24/2022    Recent Labs  Lab 04/25/22 0038  NA 137  K 4.3  CL 95*  CO2 30  BUN 40*  CREATININE 1.63*  CALCIUM 8.3*  GLUCOSE 98    Lipid Panel     Component Value Date/Time   CHOL 126 03/14/2019 1121   TRIG 82 03/14/2019 1121   HDL 49 03/14/2019 1121   CHOLHDL 5.1 CALC 12/19/2006 0916   VLDL 26 12/19/2006 0916   LDLCALC 61 03/14/2019 1121    BNP (last 3 results) Recent Labs    04/22/22 1453 04/23/22 0030 04/24/22 0050  BNP 733.7* 1,056.8* 855.1*    FOLLOW UP PLANS AND APPOINTMENTS  Allergies as of 04/25/2022       Reactions   Statins Other (See Comments)   Muscle aches and INTERNAL BLEEDING   Atorvastatin    Other reaction(s): Myalgias   Gabapentin Other (See Comments)   "Made me loopy"   Methocarbamol Other (See Comments)   "Made me loopy"        Medication List     STOP taking these medications    metolazone 2.5 MG tablet Commonly known as: ZAROXOLYN       TAKE these  medications    albuterol 108 (90 Base) MCG/ACT inhaler Commonly known as: VENTOLIN HFA Inhale 2 puffs into the lungs every 6 (six) hours as needed for wheezing or shortness of breath.   albuterol (2.5 MG/3ML) 0.083% nebulizer solution Commonly known as: PROVENTIL Take 3 mLs (2.5 mg total) by nebulization every 6 (six) hours as needed for wheezing or shortness of breath.   amiodarone 100 MG tablet Commonly known as: PACERONE Take 1 tablet (100 mg total) by mouth daily. What changed:  how much to take when to take this   apixaban 2.5 MG Tabs tablet Commonly known as: ELIQUIS Take 1 tablet (2.5 mg total) by mouth 2 (two) times daily.   atenolol 25 MG tablet Commonly known as: TENORMIN Take 1 tablet (25 mg total) by mouth daily. What changed:  medication strength how much to take   budesonide 0.5 MG/2ML nebulizer solution Commonly known as: Pulmicort Take 2 mLs (0.5 mg  total) by nebulization in the morning and at bedtime.   busPIRone 7.5 MG tablet Commonly known as: BUSPAR Take 7.5 mg by mouth as needed (anxiety).   dapagliflozin propanediol 10 MG Tabs tablet Commonly known as: Farxiga Take 1 tablet (10 mg total) by mouth daily before breakfast.   diclofenac sodium 1 % Gel Commonly known as: VOLTAREN Apply 2.25 g topically 3 (three) times daily as needed (for pain).   ferrous sulfate 325 (65 FE) MG tablet Take 1 tablet (325 mg total) by mouth daily with breakfast.   furosemide 20 MG tablet Commonly known as: LASIX Take 2 tablets (40 mg total) by mouth daily as needed for fluid.   HYDROcodone-acetaminophen 7.5-325 MG tablet Commonly known as: NORCO Take 1 tablet by mouth every 6 (six) hours as needed for moderate pain.   ipratropium 0.03 % nasal spray Commonly known as: ATROVENT Place 2 sprays into both nostrils every 12 (twelve) hours. What changed:  when to take this reasons to take this   latanoprost 0.005 % ophthalmic solution Commonly known as:  XALATAN Place 1 drop into both eyes at bedtime.   levothyroxine 125 MCG tablet Commonly known as: SYNTHROID Take 125 mcg by mouth daily before breakfast.   losartan 100 MG tablet Commonly known as: COZAAR Take 0.5 tablets (50 mg total) by mouth daily. TAKE ONE TABLET BY MOUTH AT NOON Strength: 100 mg What changed: how much to take   PRESERVISION AREDS PO Take 1 tablet by mouth 2 (two) times daily.   rOPINIRole 1 MG tablet Commonly known as: REQUIP Take 1 mg by mouth See admin instructions. Take 1 mg by mouth once a day at 1 pm and 2 tablets at 11pm.               Durable Medical Equipment  (From admission, onward)           Start     Ordered   04/25/22 0854  Heart failure home health orders  (Heart failure home health orders / Face to face)  Once       Comments: Heart Failure Follow-up Care:  Verify follow-up appointments per Patient Discharge Instructions. Confirm transportation arranged. Reconcile home medications with discharge medication list. Remove discontinued medications from use. Assist patient/caregiver to manage medications using pill box. Reinforce low sodium food selection Assessments: Vital signs and oxygen saturation at each visit. Assess home environment for safety concerns, caregiver support and availability of low-sodium foods. Consult Education officer, museum, PT/OT, Dietitian, and CNA based on assessments. Perform comprehensive cardiopulmonary assessment. Notify MD for any change in condition or weight gain of 3 pounds in one day or 5 pounds in one week with symptoms. Daily Weights and Symptom Monitoring:   Adrian Prows, MD, [facility] 04/25/2022, 8:58 AM Office: [phone] Fax: [phone] Ensure patient has access to scales. Teach patient/caregiver to weigh daily before breakfast and after voiding using same scale and record.    Teach patient/caregiver to track weight and symptoms and when to notify Provider. Activity: Develop individualized activity plan  with patient/caregiver.  Question Answer Comment  Heart Failure Follow-up Care Or per Doctor (see comments)   Lab frequency Weekly   Fax lab results to Other see comments   Diet Low Sodium Heart Healthy   Fluid restrictions: 1200 mL Fluid   Skilled Nurse to notify MD of weight trends weekly for first 2 weeks. May fax or call: (call) OR fax to: Other see comments   Initiate Heart Failure Clinic Diuretic Protocol to be used  by [place] only ( to be ordered by Heart Failure Team Providers Only) Yes      04/25/22 0858            Follow-up Information     Adrian Prows, MD Follow up on 05/04/2022.   Specialty: Cardiology Why: Bring all medications to the visit. 1/24 @ 3:15 Contact information: [place] [address] [phone]         Lisa Lopes, MD Follow up.   Specialty: Internal Medicine Why: The office will call patient. Contact information: [address] [place] [address] [phone]                  Adrian Prows, MD, [facility] 04/25/2022, 9:33 AM Office: [phone]

## 2022-04-25 NOTE — Transfer of Care (Signed)
Immediate Anesthesia Transfer of Care Note  Patient: Lisa Mcclure  Procedure(s) Performed: CARDIOVERSION  Patient Location: PACU  Anesthesia Type:MAC  Level of Consciousness: awake and alert   Airway & Oxygen Therapy: Patient Spontanous Breathing  Post-op Assessment: Report given to RN and Post -op Vital signs reviewed and stable  Post vital signs: Reviewed and stable  Last Vitals:  Vitals Value Taken Time  BP 103/59   Temp 98   Pulse 43   Resp 16   SpO2 100     Last Pain:  Vitals:   04/25/22 0716  TempSrc: Temporal  PainSc: 7          Complications: No notable events documented.

## 2022-04-25 NOTE — Progress Notes (Signed)
   04/25/22 1200  Mobility  Activity Ambulated with assistance to bathroom  Level of Assistance Independent after set-up  Assistive Device Four wheel walker  Distance Ambulated (ft) 10 ft  Activity Response Tolerated well  Mobility Referral Yes  $Mobility charge 1 Mobility   Mobility Specialist Progress Note  Pt in bed requesting to go to the BR for a BM. Left w/ instructions to pull string when ready to get back to bed. NT notified.   Lucious Groves Mobility Specialist  Please contact via SecureChat or Rehab office at [phone]

## 2022-04-25 NOTE — Discharge Instructions (Signed)
Low Sodium Nutrition Therapy  Eating less sodium can help you if you have high blood pressure, heart failure, or kidney or liver disease.   Your body needs a little sodium, but too much sodium can cause your body to hold onto extra water. This extra water will raise your blood pressure and can cause damage to your heart, kidneys, or liver as they are forced to work harder.   Sometimes you can see how the extra fluid affects you because your hands, legs, or belly swell. You may also hold water around your heart and lungs, which makes it hard to breathe.   Even if you take medication for blood pressure or a water pill (diuretic) to remove fluid, it is still important to have less salt in your diet.   Check with your primary care provider before drinking alcohol since it may affect the amount of fluid in your body and how your heart, kidneys, or liver work. Sodium in Food A low-sodium meal plan limits the sodium that you get from food and beverages to 1,500-2,000 milligrams (mg) per day. Salt is the main source of sodium. Read the nutrition label on the package to find out how much sodium is in one serving of a food.  Select foods with 140 milligrams (mg) of sodium or less per serving.  You may be able to eat one or two servings of foods with a little more than 140 milligrams (mg) of sodium if you are closely watching how much sodium you eat in a day.  Check the serving size on the label. The amount of sodium listed on the label shows the amount in one serving of the food. So, if you eat more than one serving, you will get more sodium than the amount listed.  Tips Cutting Back on Sodium Eat more fresh foods.  Fresh fruits and vegetables are low in sodium, as well as frozen vegetables and fruits that have no added juices or sauces.  Fresh meats are lower in sodium than processed meats, such as bacon, sausage, and hotdogs.  Not all processed foods are unhealthy, but some processed foods may have too  much sodium.  Eat less salt at the table and when cooking. One of the ingredients in salt is sodium.  One teaspoon of table salt has 2,300 milligrams of sodium.  Leave the salt out of recipes for pasta, casseroles, and soups. Be a Paramedic.  Food packages that say "Salt-free", sodium-free", "very low sodium," and "low sodium" have less than 140 milligrams of sodium per serving.  Beware of products identified as "Unsalted," "No Salt Added," "Reduced Sodium," or "Lower Sodium." These items may still be high in sodium. You should always check the nutrition label. Add flavors to your food without adding sodium.  Try lemon juice, lime juice, or vinegar.  Dry or fresh herbs add flavor.  Buy a sodium-free seasoning blend or make your own at home. You can purchase salt-free or sodium-free condiments like barbeque sauce in stores and online. Ask your registered dietitian nutritionist for recommendations and where to find them.   Eating in Restaurants Choose foods carefully when you eat outside your home. Restaurant foods can be very high in sodium. Many restaurants provide nutrition facts on their menus or their websites. If you cannot find that information, ask your server. Let your server know that you want your food to be cooked without salt and that you would like your salad dressing and sauces to be served on the  side.    Foods Recommended Food Group Foods Recommended  Grains Bread, bagels, rolls without salted tops Homemade bread made with reduced-sodium baking powder Cold cereals, especially shredded wheat and puffed rice Oats, grits, or cream of wheat Pastas, quinoa, and rice Popcorn, pretzels or crackers without salt Corn tortillas  Protein Foods Fresh meats and fish; [place] bacon (check the nutrition labels - make sure they are not packaged in a sodium solution) Canned or packed tuna (no more than 4 ounces at 1 serving) Beans and peas Soybeans) and tofu Eggs Nuts or nut butters  without salt  Dairy Milk or milk powder Plant milks, such as rice and soy Yogurt, including Greek yogurt Small amounts of natural cheese (blocks of cheese) or reduced-sodium cheese can be used in moderation. (Swiss, ricotta, and fresh mozzarella cheese are lower in sodium than the others) Cream Cheese Low sodium cottage cheese  Vegetables Fresh and frozen vegetables without added sauces or salt Homemade soups (without salt) Low-sodium, salt-free or sodium-free canned vegetables and soups  Fruit Fresh and canned fruits Dried fruits, such as raisins, cranberries, and prunes  Oils Tub or liquid margarine, regular or without salt Canola, corn, peanut, olive, safflower, or sunflower oils  Condiments Fresh or dried herbs such as basil, bay leaf, dill, mustard (dry), nutmeg, paprika, parsley, rosemary, sage, or thyme.  Low sodium ketchup Vinegar  Lemon or lime juice Pepper, red pepper flakes, and cayenne. Hot sauce contains sodium, but if you use just a drop or two, it will not add up to much.  Salt-free or sodium-free seasoning mixes and marinades Simple salad dressings: vinegar and oil   Foods Not Recommended Food Group Foods Not Recommended  Grains Breads or crackers topped with salt Cereals (hot/cold) with more than 300 mg sodium per serving Biscuits, cornbread, and other "quick" breads prepared with baking soda Pre-packaged bread crumbs Seasoned and packaged rice and pasta mixes Self-rising flours  Protein Foods Cured meats: Bacon, ham, sausage, pepperoni and hot dogs Canned meats (chili, vienna sausage, or sardines) Smoked fish and meats Frozen meals that have more than 600 mg of sodium per serving Egg substitute (with added sodium)  Dairy Buttermilk Processed cheese spreads Cottage cheese (1 cup may have over 500 mg of sodium; look for low-sodium.) American or feta cheese Shredded Cheese has more sodium than blocks of cheese String cheese  Vegetables Canned vegetables  (unless they are salt-free, sodium-free or low sodium) Frozen vegetables with seasoning and sauces Sauerkraut and pickled vegetables Canned or dried soups (unless they are salt-free, sodium-free, or low sodium) [place] fries and onion rings  Fruit Dried fruits preserved with additives that have sodium  Oils Salted butter or margarine, all types of olives  Condiments Salt, sea salt, kosher salt, onion salt, and garlic salt Seasoning mixes with salt Bouillon cubes Ketchup Barbeque sauce and Worcestershire sauce unless low sodium Soy sauce Salsa, pickles, olives, relish Salad dressings: ranch, blue cheese, [place], and [place].   Low Sodium Sample 1-Day Menu  Breakfast 1 cup cooked oatmeal  1 slice whole wheat bread toast  1 tablespoon peanut butter without salt  1 banana  1 cup 1% milk  Lunch Tacos made with: 2 corn tortillas   cup black beans, low sodium   cup roasted or grilled chicken (without skin)   avocado  Squeeze of lime juice  1 cup salad greens  1 tablespoon low-sodium salad dressing   cup strawberries  1 orange  Afternoon Snack 1/3 cup grapes  6 ounces yogurt  Evening Meal 3 ounces herb-baked fish  1 baked potato  2 teaspoons olive oil   cup cooked carrots  2 thick slices tomatoes on:  2 lettuce leaves  1 teaspoon olive oil  1 teaspoon balsamic vinegar  1 cup 1% milk  Evening Snack 1 apple   cup almonds without salt  Sodium Free Flavoring Tips  When cooking, the following items may be used for flavoring instead of salt or seasonings that contain sodium. Remember: A little bit of spice goes a long way! Be careful not to overseason. Spice Blend Recipe (makes about ? cup) 5 teaspoons onion powder  2 teaspoons garlic powder  2 teaspoons paprika  2 teaspoon dry mustard  1 teaspoon crushed thyme leaves   teaspoon white pepper   teaspoon celery seed Food Item Flavorings  Beef Basil, bay leaf, caraway, curry, dill, dry mustard, garlic, grape jelly,  green pepper, mace, marjoram, mushrooms (fresh), nutmeg, onion or onion powder, parsley, pepper, rosemary, sage  Chicken Basil, cloves, cranberries, mace, mushrooms (fresh), nutmeg, oregano, paprika, parsley, pineapple, saffron, sage, savory, tarragon, thyme, tomato, turmeric  Egg Chervil, curry, dill, dry mustard, garlic or garlic powder, green pepper, jelly, mushrooms (fresh), nutmeg, onion powder, paprika, parsley, rosemary, tarragon, tomato  Fish Basil, bay leaf, chervil, curry, dill, dry mustard, green pepper, lemon juice, marjoram, mushrooms (fresh), paprika, pepper, tarragon, tomato, turmeric  Lamb Cloves, curry, dill, garlic or garlic powder, mace, mint, mint jelly, onion, oregano, parsley, pineapple, rosemary, tarragon, thyme  Pork Applesauce, basil, caraway, chives, cloves, garlic or garlic powder, onion or onion powder, rosemary, thyme  Veal Apricots, basil, bay leaf, currant jelly, curry, ginger, marjoram, mushrooms (fresh), oregano, paprika  Vegetables Basil, dill, garlic or garlic powder, ginger, lemon juice, mace, marjoram, nutmeg, onion or onion powder, tarragon, tomato, sugar or sugar substitute, salt-free salad dressing, vinegar  Desserts Allspice, anise, cinnamon, cloves, ginger, mace, nutmeg, vanilla extract, other extracts   Copyright 2020  Academy of Nutrition and Dietetics. All rights reserved  Fluid Restricted Nutrition Therapy  You have been prescribed this diet because your condition affects how much fluid you can eat or drink. If your heart, liver, or kidneys aren't working properly, you may not be able to effectively eliminate fluids from the body and this may cause swelling (edema) in the legs, arms, and/or stomach. Drink no more than _________ liters or ________ ounces or ________cups of fluid per day.  You don't need to stop eating or drinking the same fluids you normally would, but you may need to eat or drink less than usual.  Your registered dietitian nutritionist  will help you determine the correct amount of fluid to consume during the day Breakfast Include fluids taken with medications  Lunch Include fluids taken with medications  Dinner Include fluids taken with medications  Bedtime Snack Include fluids taken with medications     Tips What Are Fluids?  A fluid is anything that is liquid or anything that would melt if left at room temperature. You will need to count these foods and liquids--including any liquid used to take medication--as part of your daily fluid intake. Some examples are: Alcohol (drink only with your doctor's permission)  Coffee, tea, and other hot beverages  Gelatin (Jell-O)  Gravy  Ice cream, sherbet, sorbet  Ice cubes, ice chips  Milk, liquid creamer  Nutritional supplements  Popsicles  Vegetable and fruit juices; fluid in canned fruit  Watermelon  Yogurt  Soft drinks, lemonade, limeade  Soups  Syrup How Do I Measure  My Fluid Intake? Record your fluid intake daily.  Tip: Every day, each time you eat or drink fluids, pour water in the same amount into an empty container that can hold the same amount of fluids you are allowed daily. This may help you keep track of how much fluid you are taking in throughout the day.  To accurately keep track of how much liquid you take in, measure the size of the cups, glasses, and bowls you use. If you eat soup, measure how much of it is liquid and how much is solid (such as noodles, vegetables, meat). Conversions for Measuring Fluid Intake  Milliliters (mL) Liters (L) Ounces (oz) Cups (c)  1000 1 32 4  1200 1.2 40 5  1500 1.5 50 6 1/4  1800 1.8 60 7 1/2  2000 2 67 8 1/3  Tips to Reduce Your Thirst Chew gum or suck on hard candy.  Rinse or gargle with mouthwash. Do not swallow.  Ice chips or popsicles my help quench thirst, but this too needs to be calculated into the total restriction. Melt ice chips or cubes first to figure out how much fluid they produce (for example,  experiment with melting  cup ice chips or 2 ice cubes).  Add a lemon wedge to your water.  Limit how much salt you take in. A high salt intake might make you thirstier.  Don't eat or drink all your allowed liquids at once. Space your liquids out through the day.  Use small glasses and cups and sip slowly. If allowed, take your medications with fluids you eat or drink during a meal.   Fluid-Restricted Nutrition Therapy Sample 1-Day Menu  Breakfast 1 slice wheat toast  1 tablespoon peanut butter  1/2 cup yogurt (120 milliliters)  1/2 cup blueberries  1 cup milk (240 milliliters)   Lunch 3 ounces sliced [place]  2 slices whole wheat bread  1/2 cup lettuce for sandwich  2 slices tomato for sandwich  1 ounce reduced-fat, reduced-sodium cheese  1/2 cup fresh carrot sticks  1 banana  1 cup unsweetened tea (240 milliliters)   Evening Meal 8 ounces soup (240 milliliters)  3 ounces salmon  1/2 cup quinoa  1 cup green beans  1 cup mixed greens salad  1 tablespoon olive oil  1 cup coffee (240 milliliters)  Evening Snack 1/2 cup sliced peaches  1/2 cup frozen yogurt (120 milliliters)  1 cup water (240 milliliters)  Copyright 2020  Academy of Nutrition and Dietetics. All rights reserved

## 2022-04-25 NOTE — CV Procedure (Addendum)
Direct current cardioversion 04/25/2022 9:32 AM  Indication symptomatic A. Fibrillation.  Procedure: Using 50 mg of IV Propofol and 50 IV Lidocaine (for reducing venous pain) for achieving deep sedation, synchronized direct current cardioversion performed. Patient was delivered with 150 Joules of electricity X 1 with success to NSR. Patient tolerated the procedure well. No immediate complication noted.   Allergies as of 04/25/2022       Reactions   Statins Other (See Comments)   Muscle aches and INTERNAL BLEEDING   Atorvastatin    Other reaction(s): Myalgias   Gabapentin Other (See Comments)   "Made me loopy"   Methocarbamol Other (See Comments)   "Made me loopy"        Medication List     STOP taking these medications    metolazone 2.5 MG tablet Commonly known as: ZAROXOLYN       TAKE these medications    albuterol 108 (90 Base) MCG/ACT inhaler Commonly known as: VENTOLIN HFA Inhale 2 puffs into the lungs every 6 (six) hours as needed for wheezing or shortness of breath.   albuterol (2.5 MG/3ML) 0.083% nebulizer solution Commonly known as: PROVENTIL Take 3 mLs (2.5 mg total) by nebulization every 6 (six) hours as needed for wheezing or shortness of breath.   amiodarone 100 MG tablet Commonly known as: PACERONE Take 1 tablet (100 mg total) by mouth daily. What changed:  how much to take when to take this   apixaban 2.5 MG Tabs tablet Commonly known as: ELIQUIS Take 1 tablet (2.5 mg total) by mouth 2 (two) times daily.   atenolol 25 MG tablet Commonly known as: TENORMIN Take 1 tablet (25 mg total) by mouth daily. What changed:  medication strength how much to take   budesonide 0.5 MG/2ML nebulizer solution Commonly known as: Pulmicort Take 2 mLs (0.5 mg total) by nebulization in the morning and at bedtime.   busPIRone 7.5 MG tablet Commonly known as: BUSPAR Take 7.5 mg by mouth as needed (anxiety).   dapagliflozin propanediol 10 MG Tabs  tablet Commonly known as: Farxiga Take 1 tablet (10 mg total) by mouth daily before breakfast.   diclofenac sodium 1 % Gel Commonly known as: VOLTAREN Apply 2.25 g topically 3 (three) times daily as needed (for pain).   ferrous sulfate 325 (65 FE) MG tablet Take 1 tablet (325 mg total) by mouth daily with breakfast.   furosemide 20 MG tablet Commonly known as: LASIX Take 2 tablets (40 mg total) by mouth daily as needed for fluid.   HYDROcodone-acetaminophen 7.5-325 MG tablet Commonly known as: NORCO Take 1 tablet by mouth every 6 (six) hours as needed for moderate pain.   ipratropium 0.03 % nasal spray Commonly known as: ATROVENT Place 2 sprays into both nostrils every 12 (twelve) hours. What changed:  when to take this reasons to take this   latanoprost 0.005 % ophthalmic solution Commonly known as: XALATAN Place 1 drop into both eyes at bedtime.   levothyroxine 125 MCG tablet Commonly known as: SYNTHROID Take 125 mcg by mouth daily before breakfast.   losartan 100 MG tablet Commonly known as: COZAAR Take 0.5 tablets (50 mg total) by mouth daily. TAKE ONE TABLET BY MOUTH AT NOON Strength: 100 mg What changed: how much to take   PRESERVISION AREDS PO Take 1 tablet by mouth 2 (two) times daily.   rOPINIRole 1 MG tablet Commonly known as: REQUIP Take 1 mg by mouth See admin instructions. Take 1 mg by mouth once a day at  1 pm and 2 tablets at 11pm.               Durable Medical Equipment  (From admission, onward)           Start     Ordered   04/25/22 0854  Heart failure home health orders  (Heart failure home health orders / Face to face)  Once       Comments: Heart Failure Follow-up Care:  Verify follow-up appointments per Patient Discharge Instructions. Confirm transportation arranged. Reconcile home medications with discharge medication list. Remove discontinued medications from use. Assist patient/caregiver to manage medications using pill  box. Reinforce low sodium food selection Assessments: Vital signs and oxygen saturation at each visit. Assess home environment for safety concerns, caregiver support and availability of low-sodium foods. Consult Education officer, museum, PT/OT, Dietitian, and CNA based on assessments. Perform comprehensive cardiopulmonary assessment. Notify MD for any change in condition or weight gain of 3 pounds in one day or 5 pounds in one week with symptoms. Daily Weights and Symptom Monitoring:   Adrian Prows, MD, [facility] 04/25/2022, 8:58 AM Office: [phone] Fax: [phone] Ensure patient has access to scales. Teach patient/caregiver to weigh daily before breakfast and after voiding using same scale and record.    Teach patient/caregiver to track weight and symptoms and when to notify Provider. Activity: Develop individualized activity plan with patient/caregiver.  Question Answer Comment  Heart Failure Follow-up Care Or per Doctor (see comments)   Lab frequency Weekly   Fax lab results to Other see comments   Diet Low Sodium Heart Healthy   Fluid restrictions: 1200 mL Fluid   Skilled Nurse to notify MD of weight trends weekly for first 2 weeks. May fax or call: (call) OR fax to: Other see comments   Initiate Heart Failure Clinic Diuretic Protocol to be used by [place] only ( to be ordered by Heart Failure Team Providers Only) Yes      04/25/22 0858              Adrian Prows, MD, [facility] 04/25/2022, 9:32 AM Office: [phone] Fax: [phone] Pager: [phone]

## 2022-04-25 NOTE — Progress Notes (Signed)
Heart Failure Navigator Progress Note  Assessed for Heart & Vascular TOC clinic readiness.  Patient does not meet criteria due to [facility] .   Navigator will sign off at this time.    Sitlaly Gudiel, BSN, RN Heart Failure Nurse Navigator Secure Chat Only   

## 2022-04-25 NOTE — Anesthesia Postprocedure Evaluation (Signed)
Anesthesia Post Note  Patient: Lisa Mcclure  Procedure(s) Performed: CARDIOVERSION     Patient location during evaluation: Endoscopy Anesthesia Type: General Level of consciousness: awake and alert Pain management: pain level controlled Vital Signs Assessment: post-procedure vital signs reviewed and stable Respiratory status: spontaneous breathing, nonlabored ventilation and respiratory function stable Cardiovascular status: blood pressure returned to baseline and stable Postop Assessment: no apparent nausea or vomiting Anesthetic complications: no  No notable events documented.  Last Vitals:  Vitals:   04/25/22 0807 04/25/22 0810  BP: (!) 112/51 102/61  Pulse: (!) 44 (!) 45  Resp: 14 13  Temp:    SpO2: 95% 93%    Last Pain:  Vitals:   04/25/22 0807  TempSrc:   PainSc: 0-No pain                 Maryssa Giampietro,W. EDMOND

## 2022-04-25 NOTE — Plan of Care (Signed)
Nutrition Education Note  RD consulted for nutrition education regarding CHF/low sodium nutrition education.  RD provided "Low Sodium Nutrition Therapy" handout from the Academy of Nutrition and Dietetics. Reviewed patient's dietary recall. Patient reports that her appetite varies. She lives at home alone. Breakfast may consist of a Toaster Strudel, lunch and dinner typically vary. She usually eats out at various restaurants with friends. She tries to have the chef cut back on the seasoning for her meats and then has a salad. She really enjoys pasta. At times she eats freezer dinners but compared labels and tries to choose the higher protein Lean Cuisine as these are lower in sodium.  She has tried Mrs. Dash but does not like this substitute. Her beverages include Propel, low sodium tomato juice, orange juice and water but she feels that she does not drink enough fluids. Occasionally she consumes 1 Ensure daily.   Provided examples on ways to decrease sodium intake in diet. Discouraged intake of processed foods and use of salt shaker. Encouraged fresh fruits and vegetables as well as whole grain sources of carbohydrates to maximize fiber intake.   RD discussed why it is important for patient to adhere to diet recommendations, and emphasized the role of fluids, foods to avoid, and importance of weighing self daily. Teach back method used.  Expect limited compliance.  Body mass index is 35.87 kg/m. Pt meets criteria for obesity based on current BMI.  Current diet order is Heart Healthy, patient is consuming approximately 100% of most meals at this time. Labs and medications reviewed. No further nutrition interventions warranted at this time. RD contact information provided. If additional nutrition issues arise, please re-consult RD.   Clayborne Dana, RDN, LDN Clinical Nutrition

## 2022-04-25 NOTE — Consult Note (Signed)
   [facility] Inpatient Consult   04/25/2022  Lisa Mcclure [birth year] [id number]  [place] [facility] [ACO] Patient: Medicare ACO REACH  Primary Care Provider:  [name], MD, with [facility]   Patient has a history with [place] [facility] for care coordination services.  Patient has been engaged by a [facility] RNCM and [facility] Education officer, museum.  Our community based plan of care has focused on disease management and community resource support.    Patient's electronic medical record with [facility] encounter review reveal patient had a paid caregiver  Plan:  Call attempt today to follow up on patient and reviewed inpatient TOC encounter for needs and no answer at this time..  Patient's in [facility] is listed to have a TOC follow up contact. Noted from [place] on 04/29/21 @ 12 noon per encounter note. Of note, [facility] Care Management services does not replace or interfere with any services that are needed or arranged by inpatient [facility] care management team.   For additional questions or referrals please contact:  Natividad Brood, RN BSN [place]  [phone] business mobile phone Toll free office [phone]  *[place]  [phone] Fax number: [phone] [email or web address]

## 2022-04-25 NOTE — TOC Transition Note (Signed)
Transition of Care [facility]) - CM/SW Discharge Note   Patient Details  Name: Lisa Mcclure MRN: [id number] Date of Birth: [birth year]  [facility] CM/SW Contact:  Zenon Mayo, RN Phone Number: 04/25/2022, 11:56 AM   Clinical Narrative:    Patient is for dc today, NCM offered choice, she chose [place],  NCM made referral to [facility] for [facility], HHAIDE,  he is able to take referral.  She is also interested in their aide program will have [employer] rep to cal her to discuss.  She lives alone , states her daughter lives 10 miles away and son lives in [place].  She states her daughter will transport her home today.  Daughter is not feeling well, planning  for a surgery.   Final next level of care: Home w Home Health Services Barriers to Discharge: No Barriers Identified   Patient Goals and CMS Choice CMS Medicare.gov Compare Post Acute Care list provided to:: Patient Choice offered to / list presented to : Patient  Discharge Placement                         Discharge Plan and Services Additional resources added to the After Visit Summary for                  DME Arranged: N/A DME Agency: NA       HH Arranged: RN, Nurse's Aide [place] Agency: [place] Date [facility] Agency Contacted: 04/25/22 Time [place]: 2992 Representative spoke with at [place]: [place] Determinants of Health ([place]) Interventions SDOH Screenings   Food Insecurity: No Food Insecurity (04/22/2022)  Housing: Low Risk  (04/22/2022)  Transportation Needs: No Transportation Needs (04/22/2022)  Utilities: Not At Risk (04/22/2022)  Alcohol Screen: Low Risk  (08/11/2021)  Depression (PHQ2-9): Medium Risk (08/11/2021)  Financial Resource Strain: Low Risk  (08/11/2021)  Physical Activity: Inactive (08/11/2021)  Social Connections: Moderately Isolated (08/11/2021)  Stress: No Stress Concern Present (08/11/2021)  Tobacco Use: Medium Risk (04/25/2022)     Readmission Risk  Interventions    12/25/2019    1:26 PM  Readmission Risk Prevention Plan  Transportation Screening Complete  PCP or Specialist Appt within 3-5 Days Complete  HRI or [place] Complete  Social Work Consult for [place] Planning/Counseling Complete  Palliative Care Screening Not Applicable  Medication Review Press photographer) Complete

## 2022-04-27 ENCOUNTER — Encounter (HOSPITAL_COMMUNITY): Payer: Self-pay | Admitting: Cardiology

## 2022-04-28 ENCOUNTER — Ambulatory Visit: Payer: Medicare Other | Admitting: Cardiology

## 2022-04-29 ENCOUNTER — Ambulatory Visit: Payer: Self-pay

## 2022-04-29 ENCOUNTER — Ambulatory Visit: Payer: Medicare Other | Admitting: Cardiology

## 2022-04-29 DIAGNOSIS — D649 Anemia, unspecified: Secondary | ICD-10-CM | POA: Diagnosis not present

## 2022-04-29 DIAGNOSIS — L03116 Cellulitis of left lower limb: Secondary | ICD-10-CM | POA: Diagnosis not present

## 2022-04-29 DIAGNOSIS — I130 Hypertensive heart and chronic kidney disease with heart failure and stage 1 through stage 4 chronic kidney disease, or unspecified chronic kidney disease: Secondary | ICD-10-CM | POA: Diagnosis not present

## 2022-04-29 DIAGNOSIS — Z7901 Long term (current) use of anticoagulants: Secondary | ICD-10-CM | POA: Diagnosis not present

## 2022-04-29 DIAGNOSIS — K219 Gastro-esophageal reflux disease without esophagitis: Secondary | ICD-10-CM | POA: Diagnosis not present

## 2022-04-29 DIAGNOSIS — R0609 Other forms of dyspnea: Secondary | ICD-10-CM | POA: Diagnosis not present

## 2022-04-29 DIAGNOSIS — R600 Localized edema: Secondary | ICD-10-CM | POA: Diagnosis not present

## 2022-04-29 DIAGNOSIS — I5032 Chronic diastolic (congestive) heart failure: Secondary | ICD-10-CM | POA: Diagnosis not present

## 2022-04-29 DIAGNOSIS — N1832 Chronic kidney disease, stage 3b: Secondary | ICD-10-CM | POA: Diagnosis not present

## 2022-04-29 DIAGNOSIS — I480 Paroxysmal atrial fibrillation: Secondary | ICD-10-CM | POA: Diagnosis not present

## 2022-04-29 NOTE — Patient Outreach (Signed)
  Care Coordination   04/29/2022 Name: Lisa Mcclure MRN: [id number] DOB: June 17, 1932   Care Coordination Outreach Attempts:  An unsuccessful telephone outreach was attempted for a scheduled appointment today.  Follow Up Plan:  Additional outreach attempts will be made to offer the patient care coordination information and services.   Encounter Outcome:  No Answer   Care Coordination Interventions:  No, not indicated    Barb Merino, RN, BSN, CCM Care Management Coordinator [facility] Care Management  Direct Phone: [phone]

## 2022-04-30 DIAGNOSIS — Z7951 Long term (current) use of inhaled steroids: Secondary | ICD-10-CM | POA: Diagnosis not present

## 2022-04-30 DIAGNOSIS — Z7901 Long term (current) use of anticoagulants: Secondary | ICD-10-CM | POA: Diagnosis not present

## 2022-04-30 DIAGNOSIS — D6869 Other thrombophilia: Secondary | ICD-10-CM | POA: Diagnosis not present

## 2022-04-30 DIAGNOSIS — M19012 Primary osteoarthritis, left shoulder: Secondary | ICD-10-CM | POA: Diagnosis not present

## 2022-04-30 DIAGNOSIS — I5033 Acute on chronic diastolic (congestive) heart failure: Secondary | ICD-10-CM | POA: Diagnosis not present

## 2022-04-30 DIAGNOSIS — M069 Rheumatoid arthritis, unspecified: Secondary | ICD-10-CM | POA: Diagnosis not present

## 2022-04-30 DIAGNOSIS — I083 Combined rheumatic disorders of mitral, aortic and tricuspid valves: Secondary | ICD-10-CM | POA: Diagnosis not present

## 2022-04-30 DIAGNOSIS — I2510 Atherosclerotic heart disease of native coronary artery without angina pectoris: Secondary | ICD-10-CM | POA: Diagnosis not present

## 2022-04-30 DIAGNOSIS — K219 Gastro-esophageal reflux disease without esophagitis: Secondary | ICD-10-CM | POA: Diagnosis not present

## 2022-04-30 DIAGNOSIS — I130 Hypertensive heart and chronic kidney disease with heart failure and stage 1 through stage 4 chronic kidney disease, or unspecified chronic kidney disease: Secondary | ICD-10-CM | POA: Diagnosis not present

## 2022-04-30 DIAGNOSIS — Z6835 Body mass index (BMI) 35.0-35.9, adult: Secondary | ICD-10-CM | POA: Diagnosis not present

## 2022-04-30 DIAGNOSIS — N3281 Overactive bladder: Secondary | ICD-10-CM | POA: Diagnosis not present

## 2022-04-30 DIAGNOSIS — N1832 Chronic kidney disease, stage 3b: Secondary | ICD-10-CM | POA: Diagnosis not present

## 2022-04-30 DIAGNOSIS — I872 Venous insufficiency (chronic) (peripheral): Secondary | ICD-10-CM | POA: Diagnosis not present

## 2022-04-30 DIAGNOSIS — Z7984 Long term (current) use of oral hypoglycemic drugs: Secondary | ICD-10-CM | POA: Diagnosis not present

## 2022-04-30 DIAGNOSIS — G4733 Obstructive sleep apnea (adult) (pediatric): Secondary | ICD-10-CM | POA: Diagnosis not present

## 2022-04-30 DIAGNOSIS — F32A Depression, unspecified: Secondary | ICD-10-CM | POA: Diagnosis not present

## 2022-04-30 DIAGNOSIS — I471 Supraventricular tachycardia, unspecified: Secondary | ICD-10-CM | POA: Diagnosis not present

## 2022-04-30 DIAGNOSIS — D631 Anemia in chronic kidney disease: Secondary | ICD-10-CM | POA: Diagnosis not present

## 2022-04-30 DIAGNOSIS — I493 Ventricular premature depolarization: Secondary | ICD-10-CM | POA: Diagnosis not present

## 2022-04-30 DIAGNOSIS — K5790 Diverticulosis of intestine, part unspecified, without perforation or abscess without bleeding: Secondary | ICD-10-CM | POA: Diagnosis not present

## 2022-04-30 DIAGNOSIS — G4700 Insomnia, unspecified: Secondary | ICD-10-CM | POA: Diagnosis not present

## 2022-04-30 DIAGNOSIS — I480 Paroxysmal atrial fibrillation: Secondary | ICD-10-CM | POA: Diagnosis not present

## 2022-04-30 DIAGNOSIS — Z9981 Dependence on supplemental oxygen: Secondary | ICD-10-CM | POA: Diagnosis not present

## 2022-05-02 ENCOUNTER — Ambulatory Visit: Payer: Self-pay

## 2022-05-02 DIAGNOSIS — I5033 Acute on chronic diastolic (congestive) heart failure: Secondary | ICD-10-CM | POA: Diagnosis not present

## 2022-05-02 DIAGNOSIS — I480 Paroxysmal atrial fibrillation: Secondary | ICD-10-CM | POA: Diagnosis not present

## 2022-05-02 DIAGNOSIS — D6869 Other thrombophilia: Secondary | ICD-10-CM | POA: Diagnosis not present

## 2022-05-02 DIAGNOSIS — N1832 Chronic kidney disease, stage 3b: Secondary | ICD-10-CM | POA: Diagnosis not present

## 2022-05-02 DIAGNOSIS — I130 Hypertensive heart and chronic kidney disease with heart failure and stage 1 through stage 4 chronic kidney disease, or unspecified chronic kidney disease: Secondary | ICD-10-CM | POA: Diagnosis not present

## 2022-05-02 DIAGNOSIS — D631 Anemia in chronic kidney disease: Secondary | ICD-10-CM | POA: Diagnosis not present

## 2022-05-02 NOTE — Patient Outreach (Signed)
  Care Coordination   Follow Up Visit Note   05/02/2022 Name: ALAUNA HAYDEN MRN: [id number] DOB: 1932-11-11  Melvyn Novas [place] is a 89 y.o. year old female who sees [name], MD for primary care. I spoke with  [name] by phone today.  What matters to the patients health and wellness today?  Patient will monitor her radial pulse to help determine if her pulse rate is at a normal rate. She will use deep breathing exercises to help control her breathing as needed.     Goals Addressed               This Visit's Progress     Patient Stated     I have started a new medication for my heart failure (pt-stated)        Care Coordination Interventions: Basic overview and discussion of pathophysiology of Heart Failure reviewed Reviewed role of diuretics in prevention of fluid overload and management of heart failure; Educated patient on how to use deep breathing exercises to help expand lungs and slow down breathing, patient is able to repeat back how to perform          Other     COMPLETED: Nurse Care Coordination Activities:further follow up needed        Care Coordination Interventions: Reviewed chart in preparation to contact patient, noted patient was admitted to [facility] today, 04/22/22; per Dr. Einar Gip with dx: Persistent Atrial Fib        To keep Afib stable        Care Coordination Interventions: Determined patient underwent a recent IP admission with Cardioversion for RVR Atrial Fib Review of patient status, including review of consultant's reports, relevant laboratory and other test results, and medications completed Educated patient on how to check a radial pulse for 1 full minute while resting to help determine if her heart rhythm is beating irregularly  Instructed patient to notify her Cardiologist team promptly if she believes she is experiencing Afib Reviewed scheduled/upcoming provider appointment including: 10 day post discharge  Afib follow up with Dr. Nadyne Coombes scheduled for 05/04/22 '@3'$ :15 PM         SDOH assessments and interventions completed:  No     Care Coordination Interventions:  Yes, provided   Follow up plan: Follow up call scheduled for 05/10/22 '@12'$  PM    Encounter Outcome:  Pt. Visit Completed

## 2022-05-02 NOTE — Patient Instructions (Signed)
Visit Information  Thank you for taking time to visit with me today. Please don't hesitate to contact me if I can be of assistance to you.   Following are the goals we discussed today:   Goals Addressed               This Visit's Progress     Patient Stated     I have started a new medication for my heart failure (pt-stated)        Care Coordination Interventions: Basic overview and discussion of pathophysiology of Heart Failure reviewed Reviewed role of diuretics in prevention of fluid overload and management of heart failure; Educated patient on how to use deep breathing exercises to help expand lungs and slow down breathing, patient is able to repeat back how to perform          Other     COMPLETED: Nurse Care Coordination Activities:further follow up needed        Care Coordination Interventions: Reviewed chart in preparation to contact patient, noted patient was admitted to [facility] today, 04/22/22; per Dr. Einar Gip with dx: Persistent Atrial Fib        To keep Afib stable        Care Coordination Interventions: Determined patient underwent a recent IP admission with Cardioversion for RVR Atrial Fib Review of patient status, including review of consultant's reports, relevant laboratory and other test results, and medications completed Educated patient on how to check a radial pulse for 1 full minute while resting to help determine if her heart rhythm is beating irregularly  Instructed patient to notify her Cardiologist team promptly if she believes she is experiencing Afib Reviewed scheduled/upcoming provider appointment including: 10 day post discharge Afib follow up with Dr. Nadyne Coombes scheduled for 05/04/22 '@3'$ :15 PM         Our next appointment is by telephone on 05/10/22 at 12 PM  Please call the care guide team at [phone] if you need to cancel or reschedule your appointment.   If you are experiencing a Mental Health or [place]  or need someone to talk to, please go to [facility] [address], [place] [phone])  Patient verbalizes understanding of instructions and care plan provided today and agrees to view in [place]. Active MyChart status and patient understanding of how to access instructions and care plan via MyChart confirmed with patient.     Barb Merino, RN, BSN, CCM Care Management Coordinator [facility] Care Management  Direct Phone: [phone]

## 2022-05-03 ENCOUNTER — Telehealth: Payer: Self-pay

## 2022-05-03 DIAGNOSIS — N1832 Chronic kidney disease, stage 3b: Secondary | ICD-10-CM | POA: Diagnosis not present

## 2022-05-03 DIAGNOSIS — D631 Anemia in chronic kidney disease: Secondary | ICD-10-CM | POA: Diagnosis not present

## 2022-05-03 DIAGNOSIS — I480 Paroxysmal atrial fibrillation: Secondary | ICD-10-CM | POA: Diagnosis not present

## 2022-05-03 DIAGNOSIS — D6869 Other thrombophilia: Secondary | ICD-10-CM | POA: Diagnosis not present

## 2022-05-03 DIAGNOSIS — I5033 Acute on chronic diastolic (congestive) heart failure: Secondary | ICD-10-CM | POA: Diagnosis not present

## 2022-05-03 DIAGNOSIS — I130 Hypertensive heart and chronic kidney disease with heart failure and stage 1 through stage 4 chronic kidney disease, or unspecified chronic kidney disease: Secondary | ICD-10-CM | POA: Diagnosis not present

## 2022-05-03 NOTE — Telephone Encounter (Signed)
I am seeing her tomorrow

## 2022-05-03 NOTE — Telephone Encounter (Signed)
Patient called and asked if she could use a product called no salt, but she said its high in potassium

## 2022-05-04 ENCOUNTER — Ambulatory Visit: Payer: Medicare Other | Admitting: Cardiology

## 2022-05-04 ENCOUNTER — Telehealth: Payer: Self-pay

## 2022-05-04 ENCOUNTER — Encounter: Payer: Self-pay | Admitting: Cardiology

## 2022-05-04 VITALS — BP 130/62 | HR 62 | Resp 16 | Ht 63.0 in | Wt 209.0 lb

## 2022-05-04 DIAGNOSIS — I480 Paroxysmal atrial fibrillation: Secondary | ICD-10-CM

## 2022-05-04 DIAGNOSIS — I5032 Chronic diastolic (congestive) heart failure: Secondary | ICD-10-CM | POA: Diagnosis not present

## 2022-05-04 DIAGNOSIS — I350 Nonrheumatic aortic (valve) stenosis: Secondary | ICD-10-CM

## 2022-05-04 DIAGNOSIS — I10 Essential (primary) hypertension: Secondary | ICD-10-CM | POA: Diagnosis not present

## 2022-05-04 NOTE — Telephone Encounter (Signed)
Patient weight has been more stable since her hospital admission.  Prior to hospitalization (Jan 1 - Jan 10) Average Weight Level 211.98 lbs Lowest Weight Level 207.6 lbs Highest Weight Level 216.8 lbs  Post hospitalization (Jan 17 - Jan 23) Average Weight Level 206.73 lbs Lowest Weight Level 205.8 lbs Highest Weight Level 208.2 lbs  05/03/2022 Tuesday at 06:23 AM 206.6      05/02/2022 Monday at 10:45 AM 206      05/01/2022 'Sunday at 06:35 AM 208.2      04/30/2022 Saturday at 08:08 AM 207      04/29/2022 Friday at 10:23 AM 206      04/29/2022 Friday at 10:23 AM 208.2      04/28/2022 Thursday at 04:36 AM 206      01'$ /17/2024 Wednesday at 06:06 AM 205.8

## 2022-05-04 NOTE — Progress Notes (Signed)
Primary Physician/Referring:  Lisa Lopes, MD  [name], female    DOB: [birth year], 89 y.o.   MRN: [id number]  Chief Complaint  Patient presents with   Atrial Fibrillation   Acute on chronic diastolic congestive heart failure   Hospitalization Follow-up   HPI:    Lisa Mcclure  is a 89 y.o.  female  with  obesity, chronic diastolic CHF, hypertensive heart disease and chronic 3A-B kidney disease, paroxysmal atrial fibrillation,  anemia secondary to hemorrhoids and diverticulosis, esophageal reflux, depression, OSA intolerant to CPAP on nocturnal 02, rheumatoid arthritis, and venous insufficiency, aortic and coronary calcification, s/p AAA repair on 12/20/2019 being followed by vascular surgery.    Patient presents to the office for follow-up of heart failure.  She is presently enrolled in RPM and PCM.  Patient was seen by me on 04/20/2022 with class IV acute on chronic diastolic heart failure persistent atrial fibrillation and was admitted to the hospital on 04/22/2022 and discharged on 04/25/2022 after she underwent direct-current cardioversion on 04/25/2022 to sinus rhythm.  She diuresed about 6 L prior to discharge, discharge weight 88.2 kg (194 LBS).    Okay sounds goodShe is doing her best to maintain weight loss and also restrictive foods.  She does have home health set up as well.  Past Medical History:  Diagnosis Date   Acute renal failure superimposed on stage 3b chronic kidney disease ([place]) 06/15/2021   Anemia    h/o hemorrhoidal bleeding and blood transfusion   Chronic diastolic CHF (congestive heart failure) (HCC)    Depression    Diverticulosis    DOE (dyspnea on exertion)    Esophageal reflux    GERD (gastroesophageal reflux disease)    Hypothyroidism    Insomnia    LBP (low back pain)    OAB (overactive bladder)    Obesity    OSA (obstructive sleep apnea)    Osteoarthritis    Paroxysmal atrial fibrillation (HCC)    Rheumatoid  arthritis(714.0)    Shoulder pain, bilateral    Unspecified essential hypertension    Venous insufficiency    Social History   Tobacco Use   Smoking status: Former    Packs/day: 0.20    Years: 10.00    Total pack years: 2.00    Types: Cigarettes    Quit date: 04/11/1984    Years since quitting: 38.0    Passive exposure: Past   Smokeless tobacco: Never  Substance Use Topics   Alcohol use: Yes    Alcohol/week: 0.0 standard drinks of alcohol    Comment: cocktail once a week   Marital Status: Widowed  ROS  Review of Systems  Constitutional: Positive for malaise/fatigue and weight gain.  Cardiovascular:  Positive for dyspnea on exertion and leg swelling. Negative for chest pain, orthopnea, palpitations and syncope.  Genitourinary:  Positive for bladder incontinence.   Objective  Blood pressure 130/62, pulse 62, resp. rate 16, height '5\' 3"'$  (1.6 m), weight 209 lb (94.8 kg), SpO2 100 %.     05/04/2022   10:19 AM 05/04/2022   10:18 AM 04/25/2022   10:47 AM  Vitals with BMI  Height  '5\' 3"'$    Weight  209 lbs   BMI  35.70   Systolic 177 939 030  Diastolic 62 68 48  Pulse 62 61 50     Physical Exam Vitals reviewed.  Constitutional:      Appearance: She is well-developed. She is obese.  Neck:  Vascular: Carotid bruit and JVD present.  Cardiovascular:     Rate and Rhythm: Normal rate and regular rhythm.     Pulses: Intact distal pulses.     Heart sounds: S1 normal and S2 normal. Murmur heard.     Harsh midsystolic murmur is present with a grade of 3/6 at the upper right sternal border radiating to the apex.     No gallop.  Pulmonary:     Effort: Pulmonary effort is normal. No accessory muscle usage.     Breath sounds: Rales (Bibasilar) present.  Abdominal:     General: Bowel sounds are normal.     Palpations: Abdomen is soft.  Musculoskeletal:     Right lower leg: Edema (3+ pitting) present.     Left lower leg: Edema (3+ pitting edema, superficial wound noted in the left  leg weeping serous fluid.  No signs of infection) present.    Laboratory examination:   Recent Labs    04/23/22 0030 04/24/22 0050 04/25/22 0038  NA 139 136 137  K 4.5 4.2 4.3  CL 98 95* 95*  CO2 31 32 30  GLUCOSE 102* 108* 98  BUN 30* 39* 40*  CREATININE 1.45* 1.38* 1.63*  CALCIUM 8.6* 8.4* 8.3*  GFRNONAA 35* 37* 30*      Latest Ref Rng & Units 04/25/2022   12:38 AM 04/24/2022   12:50 AM 04/23/2022   12:30 AM  CMP  Glucose 70 - 99 mg/dL 98  108  102   BUN 8 - 23 mg/dL 40  39  30   Creatinine 0.44 - 1.00 mg/dL 1.63  1.38  1.45   Sodium 135 - 145 mmol/L 137  136  139   Potassium 3.5 - 5.1 mmol/L 4.3  4.2  4.5   Chloride 98 - 111 mmol/L 95  95  98   CO2 22 - 32 mmol/L 30  32  31   Calcium 8.9 - 10.3 mg/dL 8.3  8.4  8.6       Latest Ref Rng & Units 04/24/2022   10:27 AM 04/23/2022   12:30 AM 06/21/2021    4:47 AM  CBC  WBC 4.0 - 10.5 K/uL 10.2  7.6  7.6   Hemoglobin 12.0 - 15.0 g/dL 13.3  12.9  8.7   Hematocrit 36.0 - 46.0 % 43.3  41.2  29.4   Platelets 150 - 400 K/uL 296  281  376    Lipid Panel    Component Value Date/Time   CHOL 126 03/14/2019 1121   TRIG 82 03/14/2019 1121   HDL 49 03/14/2019 1121   CHOLHDL 5.1 CALC 12/19/2006 0916   VLDL 26 12/19/2006 0916   LDLCALC 61 03/14/2019 1121   LDLDIRECT 159.2 12/19/2006 0916    Lab Results  Component Value Date   TSH 0.724 12/23/2019    External labs:      Allergies   Allergies  Allergen Reactions   Statins Other (See Comments)    Muscle aches and INTERNAL BLEEDING   Atorvastatin     Other reaction(s): Myalgias   Gabapentin Other (See Comments)    "Made me loopy"   Methocarbamol Other (See Comments)    "Made me loopy"    Final Medications at End of Visit    Current Outpatient Medications:    amiodarone (PACERONE) 100 MG tablet, Take 1 tablet (100 mg total) by mouth daily., Disp: 60 tablet, Rfl: 5   apixaban (ELIQUIS) 2.5 MG TABS tablet, Take 1 tablet (2.5 mg total)  by mouth 2 (two) times  daily., Disp: 180 tablet, Rfl: 3   atenolol (TENORMIN) 25 MG tablet, Take 1 tablet (25 mg total) by mouth daily., Disp: 90 tablet, Rfl: 1   budesonide (PULMICORT) 0.5 MG/2ML nebulizer solution, Take 2 mLs (0.5 mg total) by nebulization in the morning and at bedtime., Disp: 120 mL, Rfl: 1   busPIRone (BUSPAR) 7.5 MG tablet, Take 7.5 mg by mouth as needed (anxiety)., Disp: , Rfl:    dapagliflozin propanediol (FARXIGA) 10 MG TABS tablet, Take 1 tablet (10 mg total) by mouth daily before breakfast., Disp: 30 tablet, Rfl: 6   diclofenac sodium (VOLTAREN) 1 % GEL, Apply 2.25 g topically 3 (three) times daily as needed (for pain). , Disp: , Rfl:    furosemide (LASIX) 20 MG tablet, Take 2 tablets (40 mg total) by mouth daily as needed for fluid., Disp: 90 tablet, Rfl: 0   HYDROcodone-acetaminophen (NORCO) 7.5-325 MG tablet, Take 1 tablet by mouth every 6 (six) hours as needed for moderate pain., Disp: 20 tablet, Rfl: 0   ipratropium (ATROVENT) 0.03 % nasal spray, Place 2 sprays into both nostrils every 12 (twelve) hours. (Patient taking differently: Place 2 sprays into both nostrils as needed for rhinitis.), Disp: 30 mL, Rfl: 1   latanoprost (XALATAN) 0.005 % ophthalmic solution, Place 1 drop into both eyes at bedtime., Disp: , Rfl:    levothyroxine (SYNTHROID) 125 MCG tablet, Take 125 mcg by mouth daily before breakfast. , Disp: , Rfl:    losartan (COZAAR) 100 MG tablet, Take 0.5 tablets (50 mg total) by mouth daily. TAKE ONE TABLET BY MOUTH AT NOON Strength: 100 mg, Disp: 90 tablet, Rfl: 3   Multiple Vitamins-Minerals (PRESERVISION AREDS 2+MULTI VIT) CAPS, Take by mouth., Disp: , Rfl:    rOPINIRole (REQUIP) 1 MG tablet, Take 1 mg by mouth See admin instructions. Take 1 mg by mouth once a day at 1 pm and 2 tablets at 11pm., Disp: , Rfl: 12   albuterol (PROVENTIL) (2.5 MG/3ML) 0.083% nebulizer solution, Take 3 mLs (2.5 mg total) by nebulization every 6 (six) hours as needed for wheezing or shortness of breath.  (Patient not taking: Reported on 05/04/2022), Disp: 75 mL, Rfl: 12   albuterol (VENTOLIN HFA) 108 (90 Base) MCG/ACT inhaler, Inhale 2 puffs into the lungs every 6 (six) hours as needed for wheezing or shortness of breath. (Patient not taking: Reported on 05/04/2022), Disp: 8 g, Rfl: 2    Radiology:   CT Abdomen and Pelvis W Contrast 12/20/2019: 1. Large Infrarenal Abdominal Aortic Aneurysm measuring up to 5.7 cm with mild perianeurysmal inflammation. Differential considerations include impending AAA rupture, mycotic AAA, symptomatic AAA. Recommend urgent transfer to emergency department and Vascular Surgery consultation. Aortic aneurysm NOS (ICD10-I71.9). Aortic Atherosclerosis (ICD10-I70.0). 2. No other acute or inflammatory process identified. 5 cm fat containing left pelvic hernia. Large bowel diverticulosis. 3. Simple appearing 4 cm left ovarian cyst, recommend follow-up Ultrasound in 6-12 months  Chest X-ray 12/20/2019:  Diffuse cardiac enlargement. Emphysematous changes in the lungs. Coarse interstitial pattern to the lungs could represent fibrosis or edema. No focal consolidation. No pleural effusions. No pneumothorax. Calcified and tortuous aorta. Degenerative changes in the shoulders and spine. Old rib fractures. Similar appearance to previous study. IMPRESSION: 1. Cardiac enlargement. 2. Coarse interstitial pattern to the lungs could represent fibrosis or edema.  Abdominal X-Ray 12/20/2019:  Interval placement of aorto iliac stent graft. Nonobstructive bowel gas pattern.  Cardiac Studies:   Lexiscan myoview stress test 12/08/2017: 1. Lexiscan  stress test was performed. Exercise capacity was not assessed. Stress symptoms included abdominal pain. Blood pressure was normal. The resting and stress electrocardiogram demonstrated normal sinus rhythm, normal resting conduction, no resting arrhythmias and normal rest repolarization. 2. The overall quality of the study is good. There is  no evidence of abnormal lung activity. Stress and rest SPECT images demonstrate homogeneous tracer distribution throughout the myocardium. Gated SPECT imaging reveals normal myocardial thickening and wall motion. The left ventricular ejection fraction was normal (54%). 3. Low risk study  Ambulatory cardiac telemetry 14 days 03/22/2021 - 04/04/2021: Predominant underlying rhythm was sinus with first-degree AV block.  Patient with 19 brief episodes of SVT which were asymptomatic.  Frequent PVCs with 20.4% burden and episodes of ventricular bigeminy and trigeminy which correlated with patient triggered events.  No evidence of atrial fibrillation, ventricular tachycardia, pauses >3 seconds, or high degree AV block.  PCV ECHOCARDIOGRAM COMPLETE 02/08/2022  Narrative Echocardiogram 02/08/2022: Normal LV systolic function with visual EF 55-60%. Left ventricle cavity is normal in size. Moderate concentric hypertrophy of the left ventricle. Normal global wall motion. Indeterminate diastolic filling pattern. Calculated EF 56%. Left atrial cavity is severely dilated at 46.9 ml/m^2. Trileaflet aortic valve. Moderate aortic stenosis. Mild (Grade I) aortic regurgitation. Mild aortic valve leaflet calcification. AVA (VTI) measures 0.8 cm^2. AV Mean Grad measures 22.9 mmHg. AV Pk Vel measures 3.28 m/s. Mild (Grade I) mitral regurgitation. Mild calcification of the mitral valve annulus. Mild mitral valve leaflet thickening with mild calcification. Mildly restricted mitral valve leaflets. Structurally normal tricuspid valve with trace regurgitation. No evidence of pulmonary hypertension. Compared to 04/2021, aortic stenosis is still moderate and there is mild aortic regurgitation present.  Previously noted moderate pulm hypertension not present.  EKG   EKG 05/04/2022: Sinus rhythm with first-degree block at the rate of 60 bpm, left atrial enlargement, poor R progression, probably normal variant.  No evidence of  ischemia, normal QT interval.  Compared to 04/20/2022, atrial fibrillation with rapid ventricular response no longer present.  Compared to 10/07/2021, sinus rhythm has been replaced.  Assessment     ICD-10-CM   1. Chronic diastolic (congestive) heart failure (HCC)  I50.32 EKG 12-Lead    2. Paroxysmal atrial fibrillation (HCC)  I48.0     3. Moderate aortic stenosis  I35.0     4. Primary hypertension  I10      This patients CHA2DS2-VASc Score 6 (CHF, HTN, vasc, age, F) and yearly risk of stroke 9.8%.  No orders of the defined types were placed in this encounter.  Medications Discontinued During This Encounter  Medication Reason   ferrous sulfate 325 (65 FE) MG tablet Completed Course   Multiple Vitamins-Minerals (PRESERVISION AREDS PO) Patient Preference    Recommendations:   TERENA BOHAN  is a 89 y.o.female  with  obesity, chronic diastolic CHF, hypertensive heart disease and chronic 3A-B kidney disease, paroxysmal atrial fibrillation,  anemia secondary to hemorrhoids and diverticulosis, esophageal reflux, depression, OSA intolerant to CPAP on nocturnal 02, rheumatoid arthritis, and venous insufficiency, aortic and coronary calcification, s/p AAA repair on 12/20/2019 being followed by vascular surgery.    Patient presents to the office post hospital follow-up of heart failure (1/12 through 04/25/2022).  She is presently enrolled in RPM and PCM.  She underwent direct-current cardioversion on 04/25/2022 to sinus rhythm.  She diuresed about 6 L prior to discharge, discharge weight 88.2 kg (194 LBS).  1. Chronic diastolic (congestive) heart failure ([place]) She is presently maintained weight loss, she has  gained only about 1 to 1-1/2 pound since hospital discharge.  Again extensive discussion regarding diet.  I would like her to restrict her calories to 1000 to 1200 cal a day and continue sodium restriction to 2000 mg a day.  Post hospitalization (Jan 17 - Jan 23) Average Weight Level  206.73 lbs Lowest Weight Level 205.8 lbs Highest Weight Level 208.2 lbs  05/03/2022 Tuesday at 06:23 AM      206.6        05/02/2022 Monday at 10:45 AM       206           05/01/2022 'Sunday at 06:35 AM        208.2        04/30/2022 Saturday at 08:08 AM      207           04/29/2022 Friday at 10:23 AM          206           04/29/2022 Friday at 10:23 AM          208.2        04/28/2022 Thursday at 04:36 AM     206           01'$ /17/2024 Wednesday at 06:06 AM 205.8  2. Paroxysmal atrial fibrillation (HCC) She is fortunately maintaining sinus rhythm.  EKG is nonischemic.  3. Moderate aortic stenosis Stenosis is not severe and is not leading to her symptomatology.  No indication for TAVR.  4. Primary hypertension Blood pressure is well-controlled.  No changes in the medications were done today.  I will see her back in 4 weeks for follow-up and close monitoring. Her niece is present and all instructions discussed in detail.    Adrian Prows, MD, [facility] 05/04/2022, 11:01 AM Office: [phone] Fax: [phone] Pager: [phone]

## 2022-05-06 ENCOUNTER — Ambulatory Visit: Payer: Medicare Other | Admitting: Cardiology

## 2022-05-06 DIAGNOSIS — I480 Paroxysmal atrial fibrillation: Secondary | ICD-10-CM | POA: Diagnosis not present

## 2022-05-06 DIAGNOSIS — I5033 Acute on chronic diastolic (congestive) heart failure: Secondary | ICD-10-CM | POA: Diagnosis not present

## 2022-05-06 DIAGNOSIS — N1832 Chronic kidney disease, stage 3b: Secondary | ICD-10-CM | POA: Diagnosis not present

## 2022-05-06 DIAGNOSIS — D6869 Other thrombophilia: Secondary | ICD-10-CM | POA: Diagnosis not present

## 2022-05-06 DIAGNOSIS — D631 Anemia in chronic kidney disease: Secondary | ICD-10-CM | POA: Diagnosis not present

## 2022-05-06 DIAGNOSIS — I130 Hypertensive heart and chronic kidney disease with heart failure and stage 1 through stage 4 chronic kidney disease, or unspecified chronic kidney disease: Secondary | ICD-10-CM | POA: Diagnosis not present

## 2022-05-07 NOTE — Telephone Encounter (Signed)
Data reviewed, weight stable and patient again instructed regarding fluid restriction and salt restriction

## 2022-05-09 ENCOUNTER — Other Ambulatory Visit: Payer: Self-pay | Admitting: Cardiology

## 2022-05-09 DIAGNOSIS — I10 Essential (primary) hypertension: Secondary | ICD-10-CM

## 2022-05-09 MED ORDER — LOSARTAN POTASSIUM 100 MG PO TABS: 50.0000 mg | ORAL_TABLET | Freq: Every day | ORAL | 3 refills | Status: DC

## 2022-05-10 ENCOUNTER — Other Ambulatory Visit: Payer: Self-pay

## 2022-05-10 ENCOUNTER — Telehealth: Payer: Self-pay

## 2022-05-10 ENCOUNTER — Ambulatory Visit: Payer: Self-pay

## 2022-05-10 DIAGNOSIS — D6869 Other thrombophilia: Secondary | ICD-10-CM | POA: Diagnosis not present

## 2022-05-10 DIAGNOSIS — I493 Ventricular premature depolarization: Secondary | ICD-10-CM

## 2022-05-10 DIAGNOSIS — I480 Paroxysmal atrial fibrillation: Secondary | ICD-10-CM

## 2022-05-10 DIAGNOSIS — N1832 Chronic kidney disease, stage 3b: Secondary | ICD-10-CM | POA: Diagnosis not present

## 2022-05-10 DIAGNOSIS — I5033 Acute on chronic diastolic (congestive) heart failure: Secondary | ICD-10-CM | POA: Diagnosis not present

## 2022-05-10 DIAGNOSIS — I130 Hypertensive heart and chronic kidney disease with heart failure and stage 1 through stage 4 chronic kidney disease, or unspecified chronic kidney disease: Secondary | ICD-10-CM | POA: Diagnosis not present

## 2022-05-10 DIAGNOSIS — I10 Essential (primary) hypertension: Secondary | ICD-10-CM

## 2022-05-10 DIAGNOSIS — D631 Anemia in chronic kidney disease: Secondary | ICD-10-CM | POA: Diagnosis not present

## 2022-05-10 MED ORDER — LOSARTAN POTASSIUM 50 MG PO TABS: 50.0000 mg | ORAL_TABLET | Freq: Every day | ORAL | 4 refills | Status: DC

## 2022-05-10 MED ORDER — AMIODARONE HCL 100 MG PO TABS: 100.0000 mg | ORAL_TABLET | ORAL | 5 refills | Status: DC

## 2022-05-10 NOTE — Telephone Encounter (Signed)
Spoke with patients home health nurse, she was asking for clarification on the caloric and sodium restrictions that were discussed at the last office visit. Gave her this info (1000-1200 caloric and 2000 sodium). She also wanted to let us know the patients HR has been running low (in the 50's) and wanted to know Dr. Irven Shelling preference on what range they need to contact us with her HR. After consulting with Dr. Einar Gip he wants them to call if her HR drops to 50 or less. He also wants her to cut her Amiodarone down from 100 mg daily to 100 mg every other day.

## 2022-05-10 NOTE — Telephone Encounter (Signed)
PVC (premature ventricular contraction) - Plan: amiodarone (PACERONE) 100 MG tablet  Paroxysmal atrial fibrillation (HCC) - Plan: amiodarone (PACERONE) 100 MG tablet   Meds ordered this encounter  Medications   amiodarone (PACERONE) 100 MG tablet    Sig: Take 1 tablet (100 mg total) by mouth every other day.    Dispense:  60 tablet    Refill:  5

## 2022-05-10 NOTE — Telephone Encounter (Signed)
Re-ordering losartan '100mg'$  take 1/2 tablet daily as losartan '50mg'$  daily for patient convenience to not cut a tablet

## 2022-05-10 NOTE — Patient Outreach (Signed)
  Care Coordination   Follow Up Visit Note   05/10/2022 Name: Lisa Mcclure MRN: [id number] DOB: 1932/08/13  Lisa Mcclure is a 89 y.o. year old female who sees [name], MD for primary care. I spoke with  Lisa Mcclure by phone today.  What matters to the patients health and wellness today?  Patient will focus on adhering to her Cardiology recommendations related to following a low Sodium, low caloric diet and limiting her physical activity to help allow for lower extremity leg elevation.     Goals Addressed             This Visit's Progress    To keep Afib stable       Care Coordination Interventions: Determined patient completed a follow up with Cardiologist, Dr. Einar Gip for evaluation of Afib/CHF Review of patient status, including review of consultant's reports, relevant laboratory and other test results, and medications completed Counseled on importance to adhere to strict low Sodium diet Counseled on fatigue management, finding a good balance between activity and rest in order to decrease the amount of energy demand on your body Encouraged patient to continue to adhere to MD recommendations for CHF and Afib  Encouraged patient to continue to keep all scheduled follow up appointments and to take medications as directed Active listening / Reflection utilized  Emotional Support Provided         SDOH assessments and interventions completed:  No     Care Coordination Interventions:  Yes, provided   Follow up plan: Follow up call scheduled for 06/07/22 '@1PM'$     Encounter Outcome:  Pt. Visit Completed

## 2022-05-10 NOTE — Patient Instructions (Signed)
Visit Information  Thank you for taking time to visit with me today. Please don't hesitate to contact me if I can be of assistance to you.   Following are the goals we discussed today:   Goals Addressed             This Visit's Progress    To keep Afib stable       Care Coordination Interventions: Determined patient completed a follow up with Cardiologist, Dr. Einar Gip for evaluation of Afib/CHF Review of patient status, including review of consultant's reports, relevant laboratory and other test results, and medications completed Counseled on importance to adhere to strict low Sodium diet Counseled on fatigue management, finding a good balance between activity and rest in order to decrease the amount of energy demand on your body Encouraged patient to continue to adhere to MD recommendations for CHF and Afib  Encouraged patient to continue to keep all scheduled follow up appointments and to take medications as directed Active listening / Reflection utilized  Emotional Support Provided         Our next appointment is by telephone on 06/07/22 at 1:00 PM  Please call the care guide team at [phone] if you need to cancel or reschedule your appointment.   If you are experiencing a Mental Health or [place] or need someone to talk to, please go to [facility] [address] [place] [phone])  Patient verbalizes understanding of instructions and care plan provided today and agrees to view in [place]. Active MyChart status and patient understanding of how to access instructions and care plan via MyChart confirmed with patient.     Barb Merino, RN, BSN, CCM Care Management Coordinator [facility] Care Management  Direct Phone: [phone]

## 2022-05-11 DIAGNOSIS — I480 Paroxysmal atrial fibrillation: Secondary | ICD-10-CM | POA: Diagnosis not present

## 2022-05-11 DIAGNOSIS — D6869 Other thrombophilia: Secondary | ICD-10-CM | POA: Diagnosis not present

## 2022-05-11 DIAGNOSIS — I5033 Acute on chronic diastolic (congestive) heart failure: Secondary | ICD-10-CM | POA: Diagnosis not present

## 2022-05-11 DIAGNOSIS — I130 Hypertensive heart and chronic kidney disease with heart failure and stage 1 through stage 4 chronic kidney disease, or unspecified chronic kidney disease: Secondary | ICD-10-CM | POA: Diagnosis not present

## 2022-05-11 DIAGNOSIS — N1832 Chronic kidney disease, stage 3b: Secondary | ICD-10-CM | POA: Diagnosis not present

## 2022-05-11 DIAGNOSIS — D631 Anemia in chronic kidney disease: Secondary | ICD-10-CM | POA: Diagnosis not present

## 2022-05-11 NOTE — Progress Notes (Signed)
Already rescheduled 2/27  [place]  Direct Dial: [phone]

## 2022-05-13 DIAGNOSIS — N1832 Chronic kidney disease, stage 3b: Secondary | ICD-10-CM | POA: Diagnosis not present

## 2022-05-13 DIAGNOSIS — D6869 Other thrombophilia: Secondary | ICD-10-CM | POA: Diagnosis not present

## 2022-05-13 DIAGNOSIS — I5033 Acute on chronic diastolic (congestive) heart failure: Secondary | ICD-10-CM | POA: Diagnosis not present

## 2022-05-13 DIAGNOSIS — I130 Hypertensive heart and chronic kidney disease with heart failure and stage 1 through stage 4 chronic kidney disease, or unspecified chronic kidney disease: Secondary | ICD-10-CM | POA: Diagnosis not present

## 2022-05-13 DIAGNOSIS — D631 Anemia in chronic kidney disease: Secondary | ICD-10-CM | POA: Diagnosis not present

## 2022-05-13 DIAGNOSIS — I480 Paroxysmal atrial fibrillation: Secondary | ICD-10-CM | POA: Diagnosis not present

## 2022-05-17 DIAGNOSIS — I10 Essential (primary) hypertension: Secondary | ICD-10-CM | POA: Diagnosis not present

## 2022-05-17 DIAGNOSIS — I5033 Acute on chronic diastolic (congestive) heart failure: Secondary | ICD-10-CM | POA: Diagnosis not present

## 2022-05-17 DIAGNOSIS — N1832 Chronic kidney disease, stage 3b: Secondary | ICD-10-CM | POA: Diagnosis not present

## 2022-05-17 DIAGNOSIS — I5032 Chronic diastolic (congestive) heart failure: Secondary | ICD-10-CM | POA: Diagnosis not present

## 2022-05-17 DIAGNOSIS — I130 Hypertensive heart and chronic kidney disease with heart failure and stage 1 through stage 4 chronic kidney disease, or unspecified chronic kidney disease: Secondary | ICD-10-CM | POA: Diagnosis not present

## 2022-05-17 DIAGNOSIS — D631 Anemia in chronic kidney disease: Secondary | ICD-10-CM | POA: Diagnosis not present

## 2022-05-17 DIAGNOSIS — I480 Paroxysmal atrial fibrillation: Secondary | ICD-10-CM | POA: Diagnosis not present

## 2022-05-17 DIAGNOSIS — D6869 Other thrombophilia: Secondary | ICD-10-CM | POA: Diagnosis not present

## 2022-05-18 DIAGNOSIS — R0609 Other forms of dyspnea: Secondary | ICD-10-CM | POA: Diagnosis not present

## 2022-05-18 DIAGNOSIS — R600 Localized edema: Secondary | ICD-10-CM | POA: Diagnosis not present

## 2022-05-18 DIAGNOSIS — I5032 Chronic diastolic (congestive) heart failure: Secondary | ICD-10-CM | POA: Diagnosis not present

## 2022-05-18 DIAGNOSIS — L03116 Cellulitis of left lower limb: Secondary | ICD-10-CM | POA: Diagnosis not present

## 2022-05-18 DIAGNOSIS — Z7901 Long term (current) use of anticoagulants: Secondary | ICD-10-CM | POA: Diagnosis not present

## 2022-05-18 DIAGNOSIS — I872 Venous insufficiency (chronic) (peripheral): Secondary | ICD-10-CM | POA: Diagnosis not present

## 2022-05-18 DIAGNOSIS — R238 Other skin changes: Secondary | ICD-10-CM | POA: Diagnosis not present

## 2022-05-18 DIAGNOSIS — I130 Hypertensive heart and chronic kidney disease with heart failure and stage 1 through stage 4 chronic kidney disease, or unspecified chronic kidney disease: Secondary | ICD-10-CM | POA: Diagnosis not present

## 2022-05-18 DIAGNOSIS — I480 Paroxysmal atrial fibrillation: Secondary | ICD-10-CM | POA: Diagnosis not present

## 2022-05-18 DIAGNOSIS — M7989 Other specified soft tissue disorders: Secondary | ICD-10-CM | POA: Diagnosis not present

## 2022-05-18 DIAGNOSIS — N1832 Chronic kidney disease, stage 3b: Secondary | ICD-10-CM | POA: Diagnosis not present

## 2022-05-18 DIAGNOSIS — D649 Anemia, unspecified: Secondary | ICD-10-CM | POA: Diagnosis not present

## 2022-05-19 DIAGNOSIS — N1832 Chronic kidney disease, stage 3b: Secondary | ICD-10-CM | POA: Diagnosis not present

## 2022-05-19 DIAGNOSIS — I130 Hypertensive heart and chronic kidney disease with heart failure and stage 1 through stage 4 chronic kidney disease, or unspecified chronic kidney disease: Secondary | ICD-10-CM | POA: Diagnosis not present

## 2022-05-19 DIAGNOSIS — I480 Paroxysmal atrial fibrillation: Secondary | ICD-10-CM | POA: Diagnosis not present

## 2022-05-19 DIAGNOSIS — D6869 Other thrombophilia: Secondary | ICD-10-CM | POA: Diagnosis not present

## 2022-05-19 DIAGNOSIS — I5033 Acute on chronic diastolic (congestive) heart failure: Secondary | ICD-10-CM | POA: Diagnosis not present

## 2022-05-19 DIAGNOSIS — D631 Anemia in chronic kidney disease: Secondary | ICD-10-CM | POA: Diagnosis not present

## 2022-05-20 ENCOUNTER — Encounter (HOSPITAL_COMMUNITY): Payer: Medicare Other

## 2022-05-20 ENCOUNTER — Other Ambulatory Visit (HOSPITAL_COMMUNITY): Payer: Self-pay | Admitting: Adult Health

## 2022-05-20 DIAGNOSIS — I5033 Acute on chronic diastolic (congestive) heart failure: Secondary | ICD-10-CM | POA: Diagnosis not present

## 2022-05-20 DIAGNOSIS — N1832 Chronic kidney disease, stage 3b: Secondary | ICD-10-CM | POA: Diagnosis not present

## 2022-05-20 DIAGNOSIS — D631 Anemia in chronic kidney disease: Secondary | ICD-10-CM | POA: Diagnosis not present

## 2022-05-20 DIAGNOSIS — R600 Localized edema: Secondary | ICD-10-CM

## 2022-05-20 DIAGNOSIS — I130 Hypertensive heart and chronic kidney disease with heart failure and stage 1 through stage 4 chronic kidney disease, or unspecified chronic kidney disease: Secondary | ICD-10-CM | POA: Diagnosis not present

## 2022-05-20 DIAGNOSIS — I480 Paroxysmal atrial fibrillation: Secondary | ICD-10-CM | POA: Diagnosis not present

## 2022-05-20 DIAGNOSIS — D6869 Other thrombophilia: Secondary | ICD-10-CM | POA: Diagnosis not present

## 2022-05-23 ENCOUNTER — Telehealth: Payer: Self-pay | Admitting: Cardiology

## 2022-05-23 DIAGNOSIS — N1832 Chronic kidney disease, stage 3b: Secondary | ICD-10-CM | POA: Diagnosis not present

## 2022-05-23 DIAGNOSIS — I480 Paroxysmal atrial fibrillation: Secondary | ICD-10-CM | POA: Diagnosis not present

## 2022-05-23 DIAGNOSIS — I5033 Acute on chronic diastolic (congestive) heart failure: Secondary | ICD-10-CM | POA: Diagnosis not present

## 2022-05-23 DIAGNOSIS — I10 Essential (primary) hypertension: Secondary | ICD-10-CM

## 2022-05-23 NOTE — Telephone Encounter (Signed)
Reviewed home health plans of care dated 04/30/2022, and signed off on this.   Adrian Prows, MD, [facility] 05/23/2022, [employer] PM Office: [phone] Fax: [phone] Pager: [phone]     ICD-10-CM   1. Acute on chronic diastolic congestive heart failure (HCC)  I50.33     2. Paroxysmal atrial fibrillation (HCC)  I48.0     3. Primary hypertension  I10     4. Stage 3b chronic kidney disease (HCC)  N18.32

## 2022-05-26 ENCOUNTER — Ambulatory Visit (HOSPITAL_COMMUNITY)
Admission: RE | Admit: 2022-05-26 | Discharge: 2022-05-26 | Disposition: A | Discharge status: Home / Self Care | Payer: Medicare Other | Source: Ambulatory Visit | Attending: Adult Health | Admitting: Adult Health

## 2022-05-26 DIAGNOSIS — R600 Localized edema: Secondary | ICD-10-CM

## 2022-05-26 DIAGNOSIS — D631 Anemia in chronic kidney disease: Secondary | ICD-10-CM | POA: Diagnosis not present

## 2022-05-26 DIAGNOSIS — D6869 Other thrombophilia: Secondary | ICD-10-CM | POA: Diagnosis not present

## 2022-05-26 DIAGNOSIS — I130 Hypertensive heart and chronic kidney disease with heart failure and stage 1 through stage 4 chronic kidney disease, or unspecified chronic kidney disease: Secondary | ICD-10-CM | POA: Diagnosis not present

## 2022-05-26 DIAGNOSIS — N1832 Chronic kidney disease, stage 3b: Secondary | ICD-10-CM | POA: Diagnosis not present

## 2022-05-26 DIAGNOSIS — I5033 Acute on chronic diastolic (congestive) heart failure: Secondary | ICD-10-CM | POA: Diagnosis not present

## 2022-05-26 DIAGNOSIS — I480 Paroxysmal atrial fibrillation: Secondary | ICD-10-CM | POA: Diagnosis not present

## 2022-05-26 NOTE — Progress Notes (Signed)
VASCULAR LAB    Bilateral lower extremity venous duplex has been performed.  See CV proc for preliminary results.   Adalyn Pennock, RVT 05/26/2022, 1:51 PM

## 2022-05-30 DIAGNOSIS — I130 Hypertensive heart and chronic kidney disease with heart failure and stage 1 through stage 4 chronic kidney disease, or unspecified chronic kidney disease: Secondary | ICD-10-CM | POA: Diagnosis not present

## 2022-05-30 DIAGNOSIS — D631 Anemia in chronic kidney disease: Secondary | ICD-10-CM | POA: Diagnosis not present

## 2022-05-30 DIAGNOSIS — I5033 Acute on chronic diastolic (congestive) heart failure: Secondary | ICD-10-CM | POA: Diagnosis not present

## 2022-05-30 DIAGNOSIS — K5790 Diverticulosis of intestine, part unspecified, without perforation or abscess without bleeding: Secondary | ICD-10-CM | POA: Diagnosis not present

## 2022-05-30 DIAGNOSIS — Z7951 Long term (current) use of inhaled steroids: Secondary | ICD-10-CM | POA: Diagnosis not present

## 2022-05-30 DIAGNOSIS — I872 Venous insufficiency (chronic) (peripheral): Secondary | ICD-10-CM | POA: Diagnosis not present

## 2022-05-30 DIAGNOSIS — N3281 Overactive bladder: Secondary | ICD-10-CM | POA: Diagnosis not present

## 2022-05-30 DIAGNOSIS — I2510 Atherosclerotic heart disease of native coronary artery without angina pectoris: Secondary | ICD-10-CM | POA: Diagnosis not present

## 2022-05-30 DIAGNOSIS — G4700 Insomnia, unspecified: Secondary | ICD-10-CM | POA: Diagnosis not present

## 2022-05-30 DIAGNOSIS — F32A Depression, unspecified: Secondary | ICD-10-CM | POA: Diagnosis not present

## 2022-05-30 DIAGNOSIS — I480 Paroxysmal atrial fibrillation: Secondary | ICD-10-CM | POA: Diagnosis not present

## 2022-05-30 DIAGNOSIS — N1832 Chronic kidney disease, stage 3b: Secondary | ICD-10-CM | POA: Diagnosis not present

## 2022-05-30 DIAGNOSIS — D6869 Other thrombophilia: Secondary | ICD-10-CM | POA: Diagnosis not present

## 2022-05-30 DIAGNOSIS — I493 Ventricular premature depolarization: Secondary | ICD-10-CM | POA: Diagnosis not present

## 2022-05-30 DIAGNOSIS — Z7901 Long term (current) use of anticoagulants: Secondary | ICD-10-CM | POA: Diagnosis not present

## 2022-05-30 DIAGNOSIS — I471 Supraventricular tachycardia, unspecified: Secondary | ICD-10-CM | POA: Diagnosis not present

## 2022-05-30 DIAGNOSIS — Z9981 Dependence on supplemental oxygen: Secondary | ICD-10-CM | POA: Diagnosis not present

## 2022-05-30 DIAGNOSIS — M19012 Primary osteoarthritis, left shoulder: Secondary | ICD-10-CM | POA: Diagnosis not present

## 2022-05-30 DIAGNOSIS — G4733 Obstructive sleep apnea (adult) (pediatric): Secondary | ICD-10-CM | POA: Diagnosis not present

## 2022-05-30 DIAGNOSIS — I083 Combined rheumatic disorders of mitral, aortic and tricuspid valves: Secondary | ICD-10-CM | POA: Diagnosis not present

## 2022-05-30 DIAGNOSIS — K219 Gastro-esophageal reflux disease without esophagitis: Secondary | ICD-10-CM | POA: Diagnosis not present

## 2022-05-30 DIAGNOSIS — Z6835 Body mass index (BMI) 35.0-35.9, adult: Secondary | ICD-10-CM | POA: Diagnosis not present

## 2022-05-30 DIAGNOSIS — M069 Rheumatoid arthritis, unspecified: Secondary | ICD-10-CM | POA: Diagnosis not present

## 2022-05-30 DIAGNOSIS — Z7984 Long term (current) use of oral hypoglycemic drugs: Secondary | ICD-10-CM | POA: Diagnosis not present

## 2022-06-01 DIAGNOSIS — I5033 Acute on chronic diastolic (congestive) heart failure: Secondary | ICD-10-CM | POA: Diagnosis not present

## 2022-06-01 DIAGNOSIS — N1832 Chronic kidney disease, stage 3b: Secondary | ICD-10-CM | POA: Diagnosis not present

## 2022-06-01 DIAGNOSIS — D6869 Other thrombophilia: Secondary | ICD-10-CM | POA: Diagnosis not present

## 2022-06-01 DIAGNOSIS — D631 Anemia in chronic kidney disease: Secondary | ICD-10-CM | POA: Diagnosis not present

## 2022-06-01 DIAGNOSIS — I480 Paroxysmal atrial fibrillation: Secondary | ICD-10-CM | POA: Diagnosis not present

## 2022-06-01 DIAGNOSIS — I130 Hypertensive heart and chronic kidney disease with heart failure and stage 1 through stage 4 chronic kidney disease, or unspecified chronic kidney disease: Secondary | ICD-10-CM | POA: Diagnosis not present

## 2022-06-02 ENCOUNTER — Ambulatory Visit: Payer: Medicare Other | Admitting: Cardiology

## 2022-06-02 DIAGNOSIS — H353114 Nonexudative age-related macular degeneration, right eye, advanced atrophic with subfoveal involvement: Secondary | ICD-10-CM | POA: Diagnosis not present

## 2022-06-02 DIAGNOSIS — H35033 Hypertensive retinopathy, bilateral: Secondary | ICD-10-CM | POA: Diagnosis not present

## 2022-06-02 DIAGNOSIS — H353221 Exudative age-related macular degeneration, left eye, with active choroidal neovascularization: Secondary | ICD-10-CM | POA: Diagnosis not present

## 2022-06-02 DIAGNOSIS — H43393 Other vitreous opacities, bilateral: Secondary | ICD-10-CM | POA: Diagnosis not present

## 2022-06-02 DIAGNOSIS — H353123 Nonexudative age-related macular degeneration, left eye, advanced atrophic without subfoveal involvement: Secondary | ICD-10-CM | POA: Diagnosis not present

## 2022-06-02 DIAGNOSIS — H43813 Vitreous degeneration, bilateral: Secondary | ICD-10-CM | POA: Diagnosis not present

## 2022-06-07 ENCOUNTER — Ambulatory Visit: Payer: Self-pay

## 2022-06-07 DIAGNOSIS — D6869 Other thrombophilia: Secondary | ICD-10-CM | POA: Diagnosis not present

## 2022-06-07 DIAGNOSIS — I5033 Acute on chronic diastolic (congestive) heart failure: Secondary | ICD-10-CM | POA: Diagnosis not present

## 2022-06-07 DIAGNOSIS — N1832 Chronic kidney disease, stage 3b: Secondary | ICD-10-CM | POA: Diagnosis not present

## 2022-06-07 DIAGNOSIS — I480 Paroxysmal atrial fibrillation: Secondary | ICD-10-CM | POA: Diagnosis not present

## 2022-06-07 DIAGNOSIS — I130 Hypertensive heart and chronic kidney disease with heart failure and stage 1 through stage 4 chronic kidney disease, or unspecified chronic kidney disease: Secondary | ICD-10-CM | POA: Diagnosis not present

## 2022-06-07 DIAGNOSIS — D631 Anemia in chronic kidney disease: Secondary | ICD-10-CM | POA: Diagnosis not present

## 2022-06-07 NOTE — Patient Outreach (Signed)
  Care Coordination   06/07/2022 Name: Lisa Mcclure MRN: [id number] DOB: 05-30-1932   Care Coordination Outreach Attempts:  An unsuccessful telephone outreach was attempted for a scheduled appointment today.  Follow Up Plan:  Additional outreach attempts will be made to offer the patient care coordination information and services.   Encounter Outcome:  Pt. Request to Call Back   Care Coordination Interventions:  No, not indicated    Barb Merino, RN, BSN, CCM Care Management Coordinator [place] Management  Direct Phone: [phone]

## 2022-06-09 ENCOUNTER — Telehealth: Payer: Self-pay

## 2022-06-09 ENCOUNTER — Ambulatory Visit: Payer: Medicare Other | Admitting: Cardiology

## 2022-06-09 ENCOUNTER — Encounter: Payer: Self-pay | Admitting: Cardiology

## 2022-06-09 ENCOUNTER — Other Ambulatory Visit: Payer: Self-pay | Admitting: Cardiology

## 2022-06-09 VITALS — BP 171/74 | HR 57 | Resp 16 | Ht 63.0 in | Wt 205.8 lb

## 2022-06-09 DIAGNOSIS — I480 Paroxysmal atrial fibrillation: Secondary | ICD-10-CM

## 2022-06-09 DIAGNOSIS — I10 Essential (primary) hypertension: Secondary | ICD-10-CM

## 2022-06-09 DIAGNOSIS — I5032 Chronic diastolic (congestive) heart failure: Secondary | ICD-10-CM

## 2022-06-09 DIAGNOSIS — N1832 Chronic kidney disease, stage 3b: Secondary | ICD-10-CM

## 2022-06-09 MED ORDER — LOSARTAN POTASSIUM 25 MG PO TABS: 25.0000 mg | ORAL_TABLET | Freq: Every day | ORAL | 1 refills | Status: DC

## 2022-06-09 NOTE — Telephone Encounter (Signed)
Weight is stable and patient is seeing me today

## 2022-06-09 NOTE — Telephone Encounter (Signed)
Patient weight has been stable around dry weight (~205-206lb). Spoke with patient yesterday and confirmed with her she is taking her amiodarone '100mg'$  every other day.  Weight lb -- 204.7 (201.4 - 211.4)  06/09/22 6:40 AM  202.2 lb    06/08/22 6:43 AM  204.2 lb    06/07/22 9:13 AM  204.8 lb    06/06/22 8:15 AM  205.6 lb    06/05/22 9:17 AM  204.8 lb    06/04/22 5:36 AM  208.0 lb    06/02/22 7:35 AM  211.4 lb    06/01/22 11:00 AM  206.0 lb    05/30/22 5:24 AM  205.0 lb    05/29/22 5:17 AM  204.0 lb

## 2022-06-09 NOTE — Progress Notes (Signed)
Primary Physician/Referring:  [name], MD  [name], female    DOB: [birth year], 89 y.o.   MRN: [id number]  Chief Complaint  Patient presents with   Acute on chronic diastolic congestive heart failure   Hypertension   Atrial Fibrillation   Follow-up   HPI:    Lisa Mcclure  is a 89 y.o.  female  with  obesity, chronic diastolic CHF, hypertensive heart disease and chronic 3A-B kidney disease, paroxysmal atrial fibrillation,  anemia secondary to hemorrhoids and diverticulosis, esophageal reflux, depression, OSA intolerant to CPAP on nocturnal 02, rheumatoid arthritis, and venous insufficiency, aortic and coronary calcification, s/p AAA repair on 12/20/2019 being followed by vascular surgery.    Patient presents to the office for follow-up of heart failure.  She is presently enrolled in RPM and PCM.  Presently doing well, overall she has noticed gradual improvement in dyspnea and also leg edema.  Left leg ulceration is completely healed.  Continues to still complain of fatigue.  Past Medical History:  Diagnosis Date   Acute renal failure superimposed on stage 3b chronic kidney disease ([place]) 06/15/2021   Anemia    h/o hemorrhoidal bleeding and blood transfusion   Chronic diastolic CHF (congestive heart failure) (HCC)    Depression    Diverticulosis    DOE (dyspnea on exertion)    Esophageal reflux    GERD (gastroesophageal reflux disease)    Hypothyroidism    Insomnia    LBP (low back pain)    OAB (overactive bladder)    Obesity    OSA (obstructive sleep apnea)    Osteoarthritis    Paroxysmal atrial fibrillation (HCC)    Rheumatoid arthritis(714.0)    Shoulder pain, bilateral    Unspecified essential hypertension    Venous insufficiency    Social History   Tobacco Use   Smoking status: Former    Packs/day: 0.20    Years: 10.00    Total pack years: 2.00    Types: Cigarettes    Quit date: 04/11/1984    Years since quitting: 38.1     Passive exposure: Past   Smokeless tobacco: Never  Substance Use Topics   Alcohol use: Yes    Alcohol/week: 0.0 standard drinks of alcohol    Comment: cocktail once a week   Marital Status: Widowed  ROS  Review of Systems  Constitutional: Positive for malaise/fatigue. Negative for weight gain.  Cardiovascular:  Positive for dyspnea on exertion and leg swelling. Negative for chest pain, orthopnea, palpitations and syncope.  Genitourinary:  Positive for bladder incontinence.   Objective  Blood pressure (!) 171/74, pulse (!) 57, resp. rate 16, height '5\' 3"'$  (1.6 m), weight 205 lb 12.8 oz (93.4 kg), SpO2 91 %.     06/09/2022    2:12 PM 05/04/2022   10:19 AM 05/04/2022   10:18 AM  Vitals with BMI  Height '5\' 3"'$   '5\' 3"'$   Weight 205 lbs 13 oz  209 lbs  BMI [id number]  A999333  Systolic [id number]  Diastolic 74 62 68  Pulse 57 62 61     Physical Exam Vitals reviewed.  Constitutional:      Appearance: She is well-developed. She is obese.  Neck:     Vascular: Carotid bruit present. No JVD.  Cardiovascular:     Rate and Rhythm: Normal rate and regular rhythm.     Pulses: Intact distal pulses.     Heart sounds: S1 normal and S2 normal. Murmur heard.  Harsh midsystolic murmur is present with a grade of 3/6 at the upper right sternal border radiating to the apex.     No gallop.  Pulmonary:     Effort: Pulmonary effort is normal. No accessory muscle usage.     Breath sounds: Rales (Bibasilar faint and improved) present.  Abdominal:     General: Bowel sounds are normal.     Palpations: Abdomen is soft.  Musculoskeletal:     Right lower leg: Edema (2+ pitting) present.     Left lower leg: Edema (2+ edema. Wound completely healed) present.    Laboratory examination:   Recent Labs    04/23/22 0030 04/24/22 0050 04/25/22 0038  NA 139 136 137  K 4.5 4.2 4.3  CL 98 95* 95*  CO2 31 32 30  GLUCOSE 102* 108* 98  BUN 30* 39* 40*  CREATININE 1.45* 1.38* 1.63*  CALCIUM 8.6* 8.4* 8.3*   GFRNONAA 35* 37* 30*      Latest Ref Rng & Units 04/25/2022   12:38 AM 04/24/2022   12:50 AM 04/23/2022   12:30 AM  CMP  Glucose 70 - 99 mg/dL 98  108  102   BUN 8 - 23 mg/dL 40  39  30   Creatinine 0.44 - 1.00 mg/dL 1.63  1.38  1.45   Sodium 135 - 145 mmol/L 137  136  139   Potassium 3.5 - 5.1 mmol/L 4.3  4.2  4.5   Chloride 98 - 111 mmol/L 95  95  98   CO2 22 - 32 mmol/L 30  32  31   Calcium 8.9 - 10.3 mg/dL 8.3  8.4  8.6       Latest Ref Rng & Units 04/24/2022   10:27 AM 04/23/2022   12:30 AM 06/21/2021    4:47 AM  CBC  WBC 4.0 - 10.5 K/uL 10.2  7.6  7.6   Hemoglobin 12.0 - 15.0 g/dL 13.3  12.9  8.7   Hematocrit 36.0 - 46.0 % 43.3  41.2  29.4   Platelets 150 - 400 K/uL 296  281  376    Lipid Panel    Component Value Date/Time   CHOL 126 03/14/2019 1121   TRIG 82 03/14/2019 1121   HDL 49 03/14/2019 1121   CHOLHDL 5.1 CALC 12/19/2006 0916   VLDL 26 12/19/2006 0916   LDLCALC 61 03/14/2019 1121   LDLDIRECT 159.2 12/19/2006 0916    Lab Results  Component Value Date   TSH 0.724 12/23/2019    External labs:   Allergies   Allergies  Allergen Reactions   Statins Other (See Comments)    Muscle aches and INTERNAL BLEEDING   Atorvastatin     Other reaction(s): Myalgias   Gabapentin Other (See Comments)    "Made me loopy"   Methocarbamol Other (See Comments)    "Made me loopy"    Final Medications at End of Visit    Current Outpatient Medications:    albuterol (PROVENTIL) (2.5 MG/3ML) 0.083% nebulizer solution, Take 3 mLs (2.5 mg total) by nebulization every 6 (six) hours as needed for wheezing or shortness of breath., Disp: 75 mL, Rfl: 12   albuterol (VENTOLIN HFA) 108 (90 Base) MCG/ACT inhaler, Inhale 2 puffs into the lungs every 6 (six) hours as needed for wheezing or shortness of breath., Disp: 8 g, Rfl: 2   amiodarone (PACERONE) 100 MG tablet, Take 1 tablet (100 mg total) by mouth every other day., Disp: 60 tablet, Rfl: 5   apixaban (  ELIQUIS) 2.5 MG TABS  tablet, Take 1 tablet (2.5 mg total) by mouth 2 (two) times daily., Disp: 180 tablet, Rfl: 3   atenolol (TENORMIN) 25 MG tablet, Take 1 tablet (25 mg total) by mouth daily., Disp: 90 tablet, Rfl: 1   budesonide (PULMICORT) 0.5 MG/2ML nebulizer solution, Take 2 mLs (0.5 mg total) by nebulization in the morning and at bedtime., Disp: 120 mL, Rfl: 1   busPIRone (BUSPAR) 7.5 MG tablet, Take 7.5 mg by mouth as needed (anxiety)., Disp: , Rfl:    dapagliflozin propanediol (FARXIGA) 10 MG TABS tablet, Take 1 tablet (10 mg total) by mouth daily before breakfast., Disp: 30 tablet, Rfl: 6   diclofenac sodium (VOLTAREN) 1 % GEL, Apply 2.25 g topically 3 (three) times daily as needed (for pain). , Disp: , Rfl:    furosemide (LASIX) 20 MG tablet, Take 2 tablets (40 mg total) by mouth daily as needed for fluid., Disp: 90 tablet, Rfl: 0   HYDROcodone-acetaminophen (NORCO) 7.5-325 MG tablet, Take 1 tablet by mouth every 6 (six) hours as needed for moderate pain., Disp: 20 tablet, Rfl: 0   ipratropium (ATROVENT) 0.03 % nasal spray, Place 2 sprays into both nostrils every 12 (twelve) hours. (Patient taking differently: Place 2 sprays into both nostrils as needed for rhinitis.), Disp: 30 mL, Rfl: 1   latanoprost (XALATAN) 0.005 % ophthalmic solution, Place 1 drop into both eyes at bedtime., Disp: , Rfl:    levothyroxine (SYNTHROID) 125 MCG tablet, Take 125 mcg by mouth daily before breakfast. , Disp: , Rfl:    Multiple Vitamins-Minerals (PRESERVISION AREDS 2+MULTI VIT) CAPS, Take by mouth., Disp: , Rfl:    rOPINIRole (REQUIP) 1 MG tablet, Take 1 mg by mouth See admin instructions. Take 1 mg by mouth once a day at 1 pm and 2 tablets at 11pm., Disp: , Rfl: 12   losartan (COZAAR) 25 MG tablet, Take 1 tablet (25 mg total) by mouth daily., Disp: 90 tablet, Rfl: 1    Radiology:   CT Abdomen and Pelvis W Contrast 12/20/2019: 1. Large Infrarenal Abdominal Aortic Aneurysm measuring up to 5.7 cm with mild perianeurysmal  inflammation. Differential considerations include impending AAA rupture, mycotic AAA, symptomatic AAA. Recommend urgent transfer to emergency department and Vascular Surgery consultation. Aortic aneurysm NOS (ICD10-I71.9). Aortic Atherosclerosis (ICD10-I70.0). 2. No other acute or inflammatory process identified. 5 cm fat containing left pelvic hernia. Large bowel diverticulosis. 3. Simple appearing 4 cm left ovarian cyst, recommend follow-up Ultrasound in 6-12 months  Chest X-ray 12/20/2019:  Diffuse cardiac enlargement. Emphysematous changes in the lungs. Coarse interstitial pattern to the lungs could represent fibrosis or edema. No focal consolidation. No pleural effusions. No pneumothorax. Calcified and tortuous aorta. Degenerative changes in the shoulders and spine. Old rib fractures. Similar appearance to previous study. IMPRESSION: 1. Cardiac enlargement. 2. Coarse interstitial pattern to the lungs could represent fibrosis or edema.  Abdominal X-Ray 12/20/2019:  Interval placement of aorto iliac stent graft. Nonobstructive bowel gas pattern.  Cardiac Studies:   Lexiscan myoview stress test 12/08/2017: 1. Lexiscan stress test was performed. Exercise capacity was not assessed. Stress symptoms included abdominal pain. Blood pressure was normal. The resting and stress electrocardiogram demonstrated normal sinus rhythm, normal resting conduction, no resting arrhythmias and normal rest repolarization. 2. The overall quality of the study is good. There is no evidence of abnormal lung activity. Stress and rest SPECT images demonstrate homogeneous tracer distribution throughout the myocardium. Gated SPECT imaging reveals normal myocardial thickening and wall motion. The left  ventricular ejection fraction was normal (54%). 3. Low risk study  Ambulatory cardiac telemetry 14 days 03/22/2021 - 04/04/2021: Predominant underlying rhythm was sinus with first-degree AV block.  Patient with 19 brief  episodes of SVT which were asymptomatic.  Frequent PVCs with 20.4% burden and episodes of ventricular bigeminy and trigeminy which correlated with patient triggered events.  No evidence of atrial fibrillation, ventricular tachycardia, pauses >3 seconds, or high degree AV block.  PCV ECHOCARDIOGRAM COMPLETE 02/08/2022  Narrative Echocardiogram 02/08/2022: Normal LV systolic function with visual EF 55-60%. Left ventricle cavity is normal in size. Moderate concentric hypertrophy of the left ventricle. Normal global wall motion. Indeterminate diastolic filling pattern. Calculated EF 56%. Left atrial cavity is severely dilated at 46.9 ml/m^2. Trileaflet aortic valve. Moderate aortic stenosis. Mild (Grade I) aortic regurgitation. Mild aortic valve leaflet calcification. AVA (VTI) measures 0.8 cm^2. AV Mean Grad measures 22.9 mmHg. AV Pk Vel measures 3.28 m/s. Mild (Grade I) mitral regurgitation. Mild calcification of the mitral valve annulus. Mild mitral valve leaflet thickening with mild calcification. Mildly restricted mitral valve leaflets. Structurally normal tricuspid valve with trace regurgitation. No evidence of pulmonary hypertension. Compared to 04/2021, aortic stenosis is still moderate and there is mild aortic regurgitation present.  Previously noted moderate pulm hypertension not present.  Lower Extremity Venous Duplex (Bilateral) 05/25/2022: BILATERAL:  - No evidence of deep vein thrombosis seen in the lower extremities,  bilaterally.  -No evidence of popliteal cyst, bilaterally.   EKG   EKG 05/04/2022: Sinus rhythm with first-degree block at the rate of 60 bpm, left atrial enlargement, poor R progression, probably normal variant.  No evidence of ischemia, normal QT interval.  Compared to 04/20/2022, atrial fibrillation with rapid ventricular response no longer present.  Compared to 10/07/2021, sinus rhythm has been replaced.  Assessment     ICD-10-CM   1. Paroxysmal atrial fibrillation  (HCC)  I48.0     2. Chronic diastolic (congestive) heart failure (HCC)  I50.32 losartan (COZAAR) 25 MG tablet    3. Stage 3b chronic kidney disease (HCC)  N18.32     4. Primary hypertension  I10 losartan (COZAAR) 25 MG tablet     This patients CHA2DS2-VASc Score 6 (CHF, HTN, vasc, age, F) and yearly risk of stroke 9.8%.  Meds ordered this encounter  Medications   losartan (COZAAR) 25 MG tablet    Sig: Take 1 tablet (25 mg total) by mouth daily.    Dispense:  90 tablet    Refill:  1   Medications Discontinued During This Encounter  Medication Reason   losartan (COZAAR) 50 MG tablet Reorder   Recommendations:   Lisa Mcclure  is a 89 y.o.female  with  obesity, chronic diastolic CHF, hypertensive heart disease and chronic 3A-B kidney disease, paroxysmal atrial fibrillation,  anemia secondary to hemorrhoids and diverticulosis, esophageal reflux, depression, OSA intolerant to CPAP on nocturnal 02, rheumatoid arthritis, and venous insufficiency, aortic and coronary calcification, s/p AAA repair on 12/20/2019 being followed by vascular surgery.    Patient presents to the office post hospital follow-up of heart failure (1/12 through 04/25/2022).  She is presently enrolled in RPM and PCM.  She underwent direct-current cardioversion on 04/25/2022 to sinus rhythm.  She diuresed about 6 L prior to discharge, discharge weight 88.2 kg (194 LBS).  1. Paroxysmal atrial fibrillation ([place]) Patient is presently maintaining sinus rhythm, presently on amiodarone 100 mg daily.  2. Chronic diastolic (congestive) heart failure (HCC) Patient's symptoms of heart failure has significantly improved to grade 2  dyspnea, leg edema and also lungs are much more clear.  Her left leg ulceration due to 3-4+ leg edema is now completely healed off.  She will continue to monitor her weights regularly and also continue to be careful with her diet.  She has made significant lifestyle changes.  3. Stage 3b chronic kidney  disease ([place]) She has stage IIIb chronic kidney disease, presently on 50 mg of losartan, will reduce to 25 mg daily.  Although her blood pressure was elevated, she is on RPM and PCM, weights have been stable, blood pressure at home has been very well-controlled, will continue to monitor blood pressure as well.  Although blood pressure was elevated today, I did not make any changes.  4. Primary hypertension Please see above.  Post hospitalization (Jan 17 - Jan 23) Average Weight Level 206.73 lbs Lowest Weight Level 205.8 lbs Highest Weight Level 208.2 lbs  05/03/2022 Tuesday at 06:23 AM      206.6        05/02/2022 Monday at 10:45 AM       206           05/01/2022 'Sunday at 06:35 AM        208.2        04/30/2022 Saturday at 08:08 AM      207           04/29/2022 Friday at 10:23 AM          206           04/29/2022 Friday at 10:23 AM          208.2        04/28/2022 Thursday at 04:36 AM     206           04/27/2022 Wednesday at 06:06 AM 205.8  Patient weight has been stable around dry weight (~205-206lb). Spoke with patient yesterday and confirmed with her she is taking her amiodarone 100mg every other day.   Weight            lb         --          204.7 (201.4 - 211.4)   06/09/22 6:40 AM                     202.2   lb                      06/08/22 6:43 AM                     204.2   lb                      06/07/22 9:13 AM                     204.8   lb                      06/06/22 8:15 AM                     205.6   lb                      06/05/22 9:17 AM                     20'$ 4.8   lb  06/04/22 5:36 AM                     208.0   lb                      06/02/22 7:35 AM                     211.4   lb                      06/01/22 11:00 AM                   206.0   lb                      05/30/22 5:24 AM                     205.0   lb                      05/29/22 5:17 AM                     204.0   lb  2. Paroxysmal atrial fibrillation (HCC) She is fortunately  maintaining sinus rhythm.  EKG is nonischemic.  3. Moderate aortic stenosis Stenosis is not severe and is not leading to her symptomatology.  No indication for TAVR.  4. Primary hypertension Blood pressure is well-controlled.  No changes in the medications were done today.  I will see her back in 4 weeks for follow-up and close monitoring. Her niece is present and all instructions discussed in detail.    Adrian Prows, MD, [facility] 06/09/2022, 2:50 PM Office: [phone] Fax: [phone] Pager: [phone]

## 2022-06-10 ENCOUNTER — Ambulatory Visit: Payer: Self-pay

## 2022-06-10 DIAGNOSIS — I130 Hypertensive heart and chronic kidney disease with heart failure and stage 1 through stage 4 chronic kidney disease, or unspecified chronic kidney disease: Secondary | ICD-10-CM | POA: Diagnosis not present

## 2022-06-10 DIAGNOSIS — I480 Paroxysmal atrial fibrillation: Secondary | ICD-10-CM | POA: Diagnosis not present

## 2022-06-10 DIAGNOSIS — N1832 Chronic kidney disease, stage 3b: Secondary | ICD-10-CM | POA: Diagnosis not present

## 2022-06-10 DIAGNOSIS — D6869 Other thrombophilia: Secondary | ICD-10-CM | POA: Diagnosis not present

## 2022-06-10 DIAGNOSIS — I5033 Acute on chronic diastolic (congestive) heart failure: Secondary | ICD-10-CM | POA: Diagnosis not present

## 2022-06-10 DIAGNOSIS — D631 Anemia in chronic kidney disease: Secondary | ICD-10-CM | POA: Diagnosis not present

## 2022-06-10 NOTE — Patient Instructions (Signed)
Visit Information  Thank you for taking time to visit with me today. Please don't hesitate to contact me if I can be of assistance to you.   Following are the goals we discussed today:   Goals Addressed               This Visit's Progress     Patient Stated     I want to know more about my kidney disease (pt-stated)        Care Coordination Interventions: Assessed the Patient understanding of chronic kidney disease    Evaluation of current treatment plan related to chronic kidney disease self management and patient's adherence to plan as established by provider      Reviewed prescribed diet low Sodium  Advised patient, providing education and rationale, to monitor blood pressure daily and record, calling PCP for findings outside established parameters    Discussed complications of poorly controlled blood pressure such as heart disease, stroke, circulatory complications, vision complications, kidney impairment, sexual dysfunction    Discussed the impact of chronic kidney disease on daily life and mental health and acknowledged and normalized feelings of disempowerment, fear, and frustration    Provided education on kidney disease progression    Engage patient in early, proactive and ongoing discussion about goals of care and what matters most to them    Mailed printed educational materials related to Eating Right with Chronic Kidney disease; Why Should I Limit Sodium?           Other     I would like to have my Vitamin D level checked        Care Coordination Interventions: Evaluation of current treatment plan related to hx of Vitamin D deficiency and patient's adherence to plan as established by provider Determined patient is experiencing symptoms suggestive of vitamin d deficiency, including hair loss, and worsening muscle and joint pain  Discussed with patient she has a history of vitamin D deficiency but was advised to stop her supplement due to taking Preservision areds eye  vitamin Reviewed upcoming PCP follow up scheduled with [name] NP on 06/20/22 '@1'$ :30 PM Placed outbound call to PCP office, left message for [facility] CMA regarding patient's request to have her Vitamin D and thyroid levels checked at next scheduled visit           Our next appointment is by telephone on 06/24/22 at 10:30 AM  Please call the care guide team at [phone] if you need to cancel or reschedule your appointment.   If you are experiencing a Mental Health or [place] or need someone to talk to, please call 1-800-273-TALK (toll free, 24 hour hotline) go to [facility] [address] [place] [phone])  Patient verbalizes understanding of instructions and care plan provided today and agrees to view in [place]. Active MyChart status and patient understanding of how to access instructions and care plan via MyChart confirmed with patient.     Barb Merino, RN, BSN, CCM Care Management Coordinator [facility] Care Management  Direct Phone: [phone]

## 2022-06-10 NOTE — Patient Outreach (Addendum)
  Care Coordination   Follow Up Visit Note   06/10/2022 Name: Lisa Mcclure MRN: [id number] DOB: July 15, 1932  Lisa Mcclure is a 89 y.o. year old female who sees [name], MD for primary care. I spoke with  [name] by phone today.  What matters to the patients health and wellness today?  Patient would like to improve her kidney function and increase her daily water intake.     Goals Addressed               This Visit's Progress     Patient Stated     I want to know more about my kidney disease (pt-stated)        Care Coordination Interventions: Assessed the Patient understanding of chronic kidney disease    Evaluation of current treatment plan related to chronic kidney disease self management and patient's adherence to plan as established by provider      Reviewed prescribed diet low Sodium  Advised patient, providing education and rationale, to monitor blood pressure daily and record, calling PCP for findings outside established parameters    Discussed complications of poorly controlled blood pressure such as heart disease, stroke, circulatory complications, vision complications, kidney impairment, sexual dysfunction    Discussed the impact of chronic kidney disease on daily life and mental health and acknowledged and normalized feelings of disempowerment, fear, and frustration    Provided education on kidney disease progression    Engage patient in early, proactive and ongoing discussion about goals of care and what matters most to them    Mailed printed educational materials related to Eating Right with Chronic Kidney disease; Why Should I Limit Sodium?   I would like to have my Vitamin D level checked Care Coordination Interventions: Evaluation of current treatment plan related to hx of Vitamin D deficiency and patient's adherence to plan as established by provider Determined patient is experiencing symptoms suggestive of vitamin d deficiency,  including hair loss, and worsening muscle and joint pain  Discussed with patient she has a history of vitamin D deficiency but was advised to stop her supplement due to taking Preservision areds eye vitamin Reviewed upcoming PCP follow up scheduled with [name] NP on 06/20/22 '@1'$ :29 PM Placed outbound call to PCP office, left message for [facility] CMA regarding patient's request to have her Vitamin D and thyroid levels checked at next scheduled visit     Interventions Today    Flowsheet Row Most Recent Value  Chronic Disease   Chronic disease during today's visit Chronic Kidney Disease/End Stage Renal Disease (ESRD), Hypertension (HTN)  General Interventions   General Interventions Discussed/Reviewed General Interventions Discussed, General Interventions Reviewed, Doctor Visits  Doctor Visits Discussed/Reviewed Specialist, Doctor Visits Discussed, Doctor Visits Reviewed  Education Interventions   Education Provided Provided Printed Education, Provided Education  Nutrition Interventions   Nutrition Discussed/Reviewed Fluid intake, Decreasing salt  Pharmacy Interventions   Pharmacy Dicussed/Reviewed Pharmacy Topics Discussed, Pharmacy Topics Reviewed, Medications and their functions          SDOH assessments and interventions completed:  No     Care Coordination Interventions:  Yes, provided   Follow up plan: Follow up call scheduled for 06/24/22 '@10'$ :30 AM    Encounter Outcome:  Pt. Visit Completed

## 2022-06-13 ENCOUNTER — Other Ambulatory Visit: Payer: Self-pay

## 2022-06-13 DIAGNOSIS — I10 Essential (primary) hypertension: Secondary | ICD-10-CM

## 2022-06-13 DIAGNOSIS — D6869 Other thrombophilia: Secondary | ICD-10-CM | POA: Diagnosis not present

## 2022-06-13 DIAGNOSIS — I5033 Acute on chronic diastolic (congestive) heart failure: Secondary | ICD-10-CM | POA: Diagnosis not present

## 2022-06-13 DIAGNOSIS — I480 Paroxysmal atrial fibrillation: Secondary | ICD-10-CM | POA: Diagnosis not present

## 2022-06-13 DIAGNOSIS — I5032 Chronic diastolic (congestive) heart failure: Secondary | ICD-10-CM

## 2022-06-13 DIAGNOSIS — I130 Hypertensive heart and chronic kidney disease with heart failure and stage 1 through stage 4 chronic kidney disease, or unspecified chronic kidney disease: Secondary | ICD-10-CM | POA: Diagnosis not present

## 2022-06-13 DIAGNOSIS — N1832 Chronic kidney disease, stage 3b: Secondary | ICD-10-CM | POA: Diagnosis not present

## 2022-06-13 DIAGNOSIS — D631 Anemia in chronic kidney disease: Secondary | ICD-10-CM | POA: Diagnosis not present

## 2022-06-13 MED ORDER — LOSARTAN POTASSIUM 25 MG PO TABS: 25.0000 mg | ORAL_TABLET | Freq: Every day | ORAL | 1 refills | Status: DC

## 2022-06-14 ENCOUNTER — Ambulatory Visit: Payer: Self-pay

## 2022-06-14 ENCOUNTER — Ambulatory Visit (INDEPENDENT_AMBULATORY_CARE_PROVIDER_SITE_OTHER): Payer: Medicare Other | Admitting: Family Medicine

## 2022-06-14 ENCOUNTER — Ambulatory Visit (INDEPENDENT_AMBULATORY_CARE_PROVIDER_SITE_OTHER): Payer: Medicare Other

## 2022-06-14 ENCOUNTER — Encounter: Payer: Self-pay | Admitting: Family Medicine

## 2022-06-14 VITALS — BP 144/90 | HR 65 | Ht 63.0 in | Wt 209.2 lb

## 2022-06-14 DIAGNOSIS — M19011 Primary osteoarthritis, right shoulder: Secondary | ICD-10-CM | POA: Diagnosis not present

## 2022-06-14 DIAGNOSIS — M25512 Pain in left shoulder: Secondary | ICD-10-CM

## 2022-06-14 DIAGNOSIS — M25511 Pain in right shoulder: Secondary | ICD-10-CM | POA: Diagnosis not present

## 2022-06-14 DIAGNOSIS — G8929 Other chronic pain: Secondary | ICD-10-CM

## 2022-06-14 DIAGNOSIS — M19012 Primary osteoarthritis, left shoulder: Secondary | ICD-10-CM | POA: Diagnosis not present

## 2022-06-14 NOTE — Progress Notes (Unsigned)
I, Lisa Mcclure, CMA acting as a scribe for Lisa Leader, MD.  Lisa Mcclure is a 89 y.o. female who presents to [place] [facility] today for cont'd bilat shoulder pain. Pt was last seen by Dr. Georgina Snell on 02/01/22 and was given bilat [place] steroid injections and was referred to PT, but pt declined due to dealing w/ other health issues. Today, pt reports bilateral shoulder pain, L>R. Sx started to flare up about 1 week after injection worked better for the right shoulder. Minimal pain at rest, pain even with small movements. Notes weakness in the left arm, having to use right hand to lift the left arm. C/o n/t in both hands, causing night disturbance. Some left-sided neck pain. Left shoulder pain radiating into the deltoid region.   Dx imaging: 07/10/19 R & L shoulder XR   Pertinent review of systems: No fevers or chills.  Relevant historical information: Severe shoulder arthritis.   Exam:  BP (!) 144/90   Pulse 65   Ht '5\' 3"'$  (1.6 m)   Wt 209 lb 3.2 oz (94.9 kg)   SpO2 92%   BMI 37.06 kg/m  General: Well Developed, well nourished, and in no acute distress.   MSK: Left shoulder: Decreased musculature.  Decreased range of motion.  Mildly tender palpation all around the shoulder joint.  Right shoulder: Degrees musculature.  Decreased range of motion.  Particularly tender to palpation.    Lab and Radiology Results  Procedure: Real-time Ultrasound Guided Injection of left shoulder subacromial injection acting like a glenohumeral injection given chronic rotator cuff tear. Device: Philips Affiniti 50G Images permanently stored and available for review in PACS Verbal informed consent obtained.  Discussed risks and benefits of procedure. Warned about infection, bleeding, hyperglycemia damage to structures among others. Patient expresses understanding and agreement Time-out conducted.   Noted no overlying erythema, induration, or other signs of local infection.    Skin prepped in a sterile fashion.   Local anesthesia: Topical Ethyl chloride.   With sterile technique and under real time ultrasound guidance: 40 mg of Kenalog and 2 mg of Marcaine injected into subacromial area. Fluid seen entering the subacromial area.   Completed without difficulty   Pain moderately resolved suggesting accurate placement of the medication.   Advised to call if fevers/chills, erythema, induration, drainage, or persistent bleeding.   Images permanently stored and available for review in the ultrasound unit.  Impression: Technically successful ultrasound guided injection. Note: This approach was used as the posterior aspect of the glenohumeral joint was so abnormal appearing I could not see my usual landmarks.  I chose a subacromial approach as it should get into the glenohumeral space given her chronic rotator cuff tear.  Landmarks were normal-appearing and made yielded better results.     No results found for this or any previous visit (from the past 72 hour(s)). DG Shoulder Left  Result Date: 06/14/2022 CLINICAL DATA:  Pain EXAM: LEFT SHOULDER - 2+ VIEW COMPARISON:  None Available. FINDINGS: The AP view is limited due to patient rotation. There appear to be severe glenohumeral degenerative changes and more mild AC joint degenerative changes. No fracture or dislocation. IMPRESSION: Significantly limited study due to positioning. Severe glenohumeral degenerative changes. AC joint degenerative changes as well. Electronically Signed   By: Dorise Bullion III M.D.   On: 06/14/2022 17:07   DG Shoulder Right  Result Date: 06/14/2022 CLINICAL DATA:  Pain EXAM: RIGHT SHOULDER - 2+ VIEW COMPARISON:  None Available. FINDINGS:  Severe glenohumeral degenerative changes identified with a misshapen humeral head. Complete loss of glenohumeral joint space. Mild AC joint degenerative changes. No fracture or dislocation. No other acute abnormalities. IMPRESSION: 1. Severe glenohumeral  degenerative changes with complete loss of glenohumeral joint space. The humeral head is misshapen. 2. Mild AC joint degenerative changes. Electronically Signed   By: Dorise Bullion III M.D.   On: 06/14/2022 17:06   I, Lisa Mcclure, personally (independently) visualized and performed the interpretation of the images attached in this note.     Assessment and Plan: 89 y.o. female with bilateral shoulder pain due to his considerable glenohumeral DJD. She is running out of conservative management options.  Recommend try another injection in the left shoulder today and proceed with a little more physical therapy but I am not optimistic.  She is not a surgical candidate given her other medical issues and how poor the bone is in the glenoid (she has been told this by Dr. Onnie Graham in the past). The only other option would be a trial of an ablation of the nerves provide sensation to the glenohumeral joint.  Would refer potentially to Dr. Davy Pique or other pain management or PM&R doctors.   PDMP not reviewed this encounter. Orders Placed This Encounter  Procedures   [place] LIMITED JOINT SPACE STRUCTURES UP BILAT(NO LINKED CHARGES)    Order Specific Question:   Reason for Exam (SYMPTOM  OR DIAGNOSIS REQUIRED)    Answer:   bilateral shoulder pain    Order Specific Question:   Preferred imaging location?    Answer:   [place] [facility]   DG Shoulder Left    Standing Status:   Future    Number of Occurrences:   1    Standing Expiration Date:   07/15/2022    Order Specific Question:   Reason for Exam (SYMPTOM  OR DIAGNOSIS REQUIRED)    Answer:   bilateral shoulder pain    Order Specific Question:   Preferred imaging location?    Answer:   [facility]   DG Shoulder Right    Standing Status:   Future    Number of Occurrences:   1    Standing Expiration Date:   06/14/2023    Order Specific Question:   Reason for Exam (SYMPTOM  OR DIAGNOSIS REQUIRED)    Answer:   bilateral shoulder pain     Order Specific Question:   Preferred imaging location?    Answer:   [facility]   No orders of the defined types were placed in this encounter.    Discussed warning signs or symptoms. Please see discharge instructions. Patient expresses understanding.   The above documentation has been reviewed and is accurate and complete Lisa Mcclure, M.D.

## 2022-06-14 NOTE — Patient Instructions (Addendum)
Thank you for coming in today.   Please get an Xray today before you leave   You received an injection today. Seek immediate medical attention if the joint becomes red, extremely painful, or is oozing fluid.   Continue home health physical therapy

## 2022-06-14 NOTE — Patient Outreach (Signed)
  Care Coordination   Provider Collaboration   Visit Note   06/14/2022 Name: Lisa Mcclure MRN: [id number] DOB: February 21, 1933  Lisa Mcclure is a 89 y.o. year old female who sees [name], MD for primary care. I  left a voice message for assistant McKayla working with Dr. Sharlett Iles.   What matters to the patients health and wellness today?  Patient requesting PCP provider check her Vitamin D level at next scheduled visit.     Goals Addressed             This Visit's Progress    I would like to have my Vitamin D level checked       Care Coordination Interventions: Evaluation of current treatment plan related to hx of Vitamin D deficiency and patient's adherence to plan as established by provider Placed outbound call to Dr. Buel Ream office, left a detailed voice message for assistant McKayla advising patient reported symptoms of Vitamin D deficiency, advised patient reports having past deficiency but has not had level checked in a while, patient has thyroid disease Advised patient is scheduled to see [name] NP on 06/20/22 and would like to request Katie order a Vitamin D level to be checked     Interventions Today    Flowsheet Row Most Recent Value  General Interventions   General Interventions Discussed/Reviewed Communication with  [Dr. Sharlett Iles PCP, left vm for McKayla, CMA]          SDOH assessments and interventions completed:  No     Care Coordination Interventions:  Yes, provided   Follow up plan: Follow up call scheduled for 06/24/22 '@10'$ :30 AM    Encounter Outcome:  Pt. Visit Completed

## 2022-06-15 ENCOUNTER — Telehealth: Payer: Self-pay

## 2022-06-15 NOTE — Telephone Encounter (Signed)
Returned call to [employer]. I left a message to call us back.

## 2022-06-15 NOTE — Progress Notes (Signed)
Left shoulder shows severe arthritis changes.

## 2022-06-15 NOTE — Progress Notes (Signed)
Right shoulder x-ray shows severe arthritis changes.

## 2022-06-16 ENCOUNTER — Telehealth: Payer: Self-pay

## 2022-06-16 DIAGNOSIS — I5032 Chronic diastolic (congestive) heart failure: Secondary | ICD-10-CM | POA: Diagnosis not present

## 2022-06-16 DIAGNOSIS — N1832 Chronic kidney disease, stage 3b: Secondary | ICD-10-CM | POA: Diagnosis not present

## 2022-06-16 DIAGNOSIS — I130 Hypertensive heart and chronic kidney disease with heart failure and stage 1 through stage 4 chronic kidney disease, or unspecified chronic kidney disease: Secondary | ICD-10-CM | POA: Diagnosis not present

## 2022-06-16 DIAGNOSIS — D631 Anemia in chronic kidney disease: Secondary | ICD-10-CM | POA: Diagnosis not present

## 2022-06-16 DIAGNOSIS — I5033 Acute on chronic diastolic (congestive) heart failure: Secondary | ICD-10-CM | POA: Diagnosis not present

## 2022-06-16 DIAGNOSIS — I480 Paroxysmal atrial fibrillation: Secondary | ICD-10-CM | POA: Diagnosis not present

## 2022-06-16 DIAGNOSIS — D6869 Other thrombophilia: Secondary | ICD-10-CM | POA: Diagnosis not present

## 2022-06-16 NOTE — Telephone Encounter (Signed)
[facility] called back, to let us know that they have received all of the patients refills.

## 2022-06-18 NOTE — Progress Notes (Signed)
Appointment changed

## 2022-06-19 ENCOUNTER — Encounter (HOSPITAL_COMMUNITY): Payer: Self-pay | Admitting: Emergency Medicine

## 2022-06-19 ENCOUNTER — Inpatient Hospital Stay (HOSPITAL_COMMUNITY)
Admission: EM | Admit: 2022-06-19 | Discharge: 2022-06-24 | DRG: 291 | Disposition: A | Discharge status: Home Health | Payer: Medicare Other | Attending: Internal Medicine | Admitting: Internal Medicine

## 2022-06-19 ENCOUNTER — Emergency Department (HOSPITAL_COMMUNITY): Payer: Medicare Other

## 2022-06-19 ENCOUNTER — Other Ambulatory Visit: Payer: Self-pay

## 2022-06-19 DIAGNOSIS — E669 Obesity, unspecified: Secondary | ICD-10-CM | POA: Diagnosis not present

## 2022-06-19 DIAGNOSIS — I509 Heart failure, unspecified: Secondary | ICD-10-CM | POA: Insufficient documentation

## 2022-06-19 DIAGNOSIS — M069 Rheumatoid arthritis, unspecified: Secondary | ICD-10-CM | POA: Diagnosis present

## 2022-06-19 DIAGNOSIS — R531 Weakness: Secondary | ICD-10-CM | POA: Diagnosis not present

## 2022-06-19 DIAGNOSIS — I160 Hypertensive urgency: Secondary | ICD-10-CM | POA: Diagnosis not present

## 2022-06-19 DIAGNOSIS — I5033 Acute on chronic diastolic (congestive) heart failure: Secondary | ICD-10-CM | POA: Diagnosis present

## 2022-06-19 DIAGNOSIS — N1831 Chronic kidney disease, stage 3a: Secondary | ICD-10-CM | POA: Diagnosis present

## 2022-06-19 DIAGNOSIS — J810 Acute pulmonary edema: Secondary | ICD-10-CM | POA: Diagnosis not present

## 2022-06-19 DIAGNOSIS — R0902 Hypoxemia: Secondary | ICD-10-CM | POA: Diagnosis not present

## 2022-06-19 DIAGNOSIS — Z801 Family history of malignant neoplasm of trachea, bronchus and lung: Secondary | ICD-10-CM | POA: Diagnosis not present

## 2022-06-19 DIAGNOSIS — I5031 Acute diastolic (congestive) heart failure: Secondary | ICD-10-CM | POA: Diagnosis not present

## 2022-06-19 DIAGNOSIS — J449 Chronic obstructive pulmonary disease, unspecified: Secondary | ICD-10-CM | POA: Diagnosis present

## 2022-06-19 DIAGNOSIS — E039 Hypothyroidism, unspecified: Secondary | ICD-10-CM | POA: Diagnosis present

## 2022-06-19 DIAGNOSIS — F419 Anxiety disorder, unspecified: Secondary | ICD-10-CM | POA: Diagnosis present

## 2022-06-19 DIAGNOSIS — G4733 Obstructive sleep apnea (adult) (pediatric): Secondary | ICD-10-CM | POA: Diagnosis present

## 2022-06-19 DIAGNOSIS — Z888 Allergy status to other drugs, medicaments and biological substances status: Secondary | ICD-10-CM | POA: Diagnosis not present

## 2022-06-19 DIAGNOSIS — G2581 Restless legs syndrome: Secondary | ICD-10-CM | POA: Diagnosis present

## 2022-06-19 DIAGNOSIS — Z803 Family history of malignant neoplasm of breast: Secondary | ICD-10-CM

## 2022-06-19 DIAGNOSIS — I480 Paroxysmal atrial fibrillation: Secondary | ICD-10-CM | POA: Diagnosis present

## 2022-06-19 DIAGNOSIS — I161 Hypertensive emergency: Secondary | ICD-10-CM | POA: Diagnosis present

## 2022-06-19 DIAGNOSIS — R001 Bradycardia, unspecified: Secondary | ICD-10-CM | POA: Diagnosis not present

## 2022-06-19 DIAGNOSIS — K219 Gastro-esophageal reflux disease without esophagitis: Secondary | ICD-10-CM | POA: Diagnosis present

## 2022-06-19 DIAGNOSIS — R0602 Shortness of breath: Secondary | ICD-10-CM | POA: Diagnosis not present

## 2022-06-19 DIAGNOSIS — N170 Acute kidney failure with tubular necrosis: Secondary | ICD-10-CM | POA: Diagnosis present

## 2022-06-19 DIAGNOSIS — Z7901 Long term (current) use of anticoagulants: Secondary | ICD-10-CM

## 2022-06-19 DIAGNOSIS — I5032 Chronic diastolic (congestive) heart failure: Secondary | ICD-10-CM | POA: Diagnosis present

## 2022-06-19 DIAGNOSIS — E66812 Obesity, class 2: Secondary | ICD-10-CM | POA: Diagnosis present

## 2022-06-19 DIAGNOSIS — I130 Hypertensive heart and chronic kidney disease with heart failure and stage 1 through stage 4 chronic kidney disease, or unspecified chronic kidney disease: Principal | ICD-10-CM | POA: Diagnosis present

## 2022-06-19 DIAGNOSIS — G4730 Sleep apnea, unspecified: Secondary | ICD-10-CM | POA: Diagnosis not present

## 2022-06-19 DIAGNOSIS — Z8249 Family history of ischemic heart disease and other diseases of the circulatory system: Secondary | ICD-10-CM

## 2022-06-19 DIAGNOSIS — N1832 Chronic kidney disease, stage 3b: Secondary | ICD-10-CM | POA: Diagnosis present

## 2022-06-19 DIAGNOSIS — I350 Nonrheumatic aortic (valve) stenosis: Secondary | ICD-10-CM | POA: Diagnosis not present

## 2022-06-19 DIAGNOSIS — I2510 Atherosclerotic heart disease of native coronary artery without angina pectoris: Secondary | ICD-10-CM | POA: Diagnosis present

## 2022-06-19 DIAGNOSIS — Z79890 Hormone replacement therapy (postmenopausal): Secondary | ICD-10-CM

## 2022-06-19 DIAGNOSIS — J811 Chronic pulmonary edema: Secondary | ICD-10-CM | POA: Diagnosis not present

## 2022-06-19 DIAGNOSIS — I110 Hypertensive heart disease with heart failure: Secondary | ICD-10-CM | POA: Diagnosis not present

## 2022-06-19 DIAGNOSIS — I10 Essential (primary) hypertension: Secondary | ICD-10-CM | POA: Diagnosis not present

## 2022-06-19 DIAGNOSIS — Z6836 Body mass index (BMI) 36.0-36.9, adult: Secondary | ICD-10-CM | POA: Diagnosis not present

## 2022-06-19 DIAGNOSIS — Z66 Do not resuscitate: Secondary | ICD-10-CM | POA: Diagnosis present

## 2022-06-19 DIAGNOSIS — I4821 Permanent atrial fibrillation: Secondary | ICD-10-CM | POA: Diagnosis present

## 2022-06-19 DIAGNOSIS — Z743 Need for continuous supervision: Secondary | ICD-10-CM | POA: Diagnosis not present

## 2022-06-19 DIAGNOSIS — Z87891 Personal history of nicotine dependence: Secondary | ICD-10-CM | POA: Diagnosis not present

## 2022-06-19 DIAGNOSIS — F32A Depression, unspecified: Secondary | ICD-10-CM | POA: Diagnosis present

## 2022-06-19 DIAGNOSIS — J9811 Atelectasis: Secondary | ICD-10-CM | POA: Diagnosis present

## 2022-06-19 DIAGNOSIS — Z825 Family history of asthma and other chronic lower respiratory diseases: Secondary | ICD-10-CM

## 2022-06-19 DIAGNOSIS — Z79899 Other long term (current) drug therapy: Secondary | ICD-10-CM

## 2022-06-19 LAB — COMPREHENSIVE METABOLIC PANEL
ALT: 35 U/L (ref 0–44)
AST: 40 U/L (ref 15–41)
Albumin: 3.5 g/dL (ref 3.5–5.0)
Alkaline Phosphatase: 89 U/L (ref 38–126)
Anion gap: 8 (ref 5–15)
BUN: 13 mg/dL (ref 8–23)
CO2: 31 mmol/L (ref 22–32)
Calcium: 8.7 mg/dL — ABNORMAL LOW (ref 8.9–10.3)
Chloride: 99 mmol/L (ref 98–111)
Creatinine, Ser: 0.83 mg/dL (ref 0.44–1.00)
GFR, Estimated: >60 mL/min (ref >60)
Glucose, Bld: 100 mg/dL — ABNORMAL HIGH (ref 70–99)
Potassium: 4 mmol/L (ref 3.5–5.1)
Sodium: 138 mmol/L (ref 135–145)
Total Bilirubin: 0.5 mg/dL (ref 0.3–1.2)
Total Protein: 7.6 g/dL (ref 6.5–8.1)

## 2022-06-19 LAB — TROPONIN I (HIGH SENSITIVITY)
Troponin I (High Sensitivity): 16 ng/L (ref <18)
Troponin I (High Sensitivity): 14 ng/L (ref <18)

## 2022-06-19 LAB — CBC WITH DIFFERENTIAL/PLATELET
Abs Immature Granulocytes: 0.04 10*3/uL (ref 0.00–0.07)
Basophils Absolute: 0.1 10*3/uL (ref 0.0–0.1)
Basophils Relative: 1 %
Eosinophils Absolute: 0.1 10*3/uL (ref 0.0–0.5)
Eosinophils Relative: 1 %
HCT: 44.7 % (ref 36.0–46.0)
Hemoglobin: 13.8 g/dL (ref 12.0–15.0)
Immature Granulocytes: 0 %
Lymphocytes Relative: 8 %
Lymphs Abs: 0.8 10*3/uL (ref 0.7–4.0)
MCH: 30.8 pg (ref 26.0–34.0)
MCHC: 30.9 g/dL (ref 30.0–36.0)
MCV: 99.8 fL (ref 80.0–100.0)
Monocytes Absolute: 0.8 10*3/uL (ref 0.1–1.0)
Monocytes Relative: 8 %
Neutro Abs: 7.6 10*3/uL (ref 1.7–7.7)
Neutrophils Relative %: 82 %
Platelets: 250 10*3/uL (ref 150–400)
RBC: 4.48 MIL/uL (ref 3.87–5.11)
RDW: 14.6 % (ref 11.5–15.5)
WBC: 9.4 10*3/uL (ref 4.0–10.5)
nRBC: 0 % (ref 0.0–0.2)

## 2022-06-19 MED ORDER — FUROSEMIDE 10 MG/ML IJ SOLN
40.0000 mg | Freq: Once | INTRAMUSCULAR | Status: AC
  Administered 2022-06-19: 40 mg via INTRAVENOUS
  Filled 2022-06-19: qty 4

## 2022-06-19 MED ORDER — ATENOLOL 25 MG PO TABS: 25.0000 mg | ORAL_TABLET | Freq: Every day | ORAL | Status: DC

## 2022-06-19 MED ORDER — IPRATROPIUM-ALBUTEROL 0.5-2.5 (3) MG/3ML IN SOLN
3.0000 mL | Freq: Once | RESPIRATORY_TRACT | Status: AC
  Administered 2022-06-19: 3 mL via RESPIRATORY_TRACT
  Filled 2022-06-19: qty 3

## 2022-06-19 MED ORDER — BUDESONIDE 0.5 MG/2ML IN SUSP
0.5000 mg | Freq: Two times a day (BID) | RESPIRATORY_TRACT | Status: DC
  Administered 2022-06-19 – 2022-06-24 (×10): 0.5 mg via RESPIRATORY_TRACT
  Filled 2022-06-19 (×10): qty 2

## 2022-06-19 MED ORDER — BUSPIRONE HCL 5 MG PO TABS
7.5000 mg | ORAL_TABLET | Freq: Every day | ORAL | Status: DC
  Administered 2022-06-19 – 2022-06-23 (×5): 7.5 mg via ORAL
  Filled 2022-06-19 (×2): qty 1
  Filled 2022-06-19 (×3): qty 2

## 2022-06-19 MED ORDER — ROPINIROLE HCL 1 MG PO TABS
2.0000 mg | ORAL_TABLET | Freq: Every day | ORAL | Status: DC
  Administered 2022-06-19 – 2022-06-23 (×5): 2 mg via ORAL
  Filled 2022-06-19 (×5): qty 2

## 2022-06-19 MED ORDER — DICLOFENAC SODIUM 1 % EX GEL: 2.0000 g | Freq: Four times a day (QID) | CUTANEOUS | Status: DC | PRN

## 2022-06-19 MED ORDER — MELATONIN 3 MG PO TABS
3.0000 mg | ORAL_TABLET | Freq: Every day | ORAL | Status: DC
  Administered 2022-06-19 – 2022-06-23 (×5): 3 mg via ORAL
  Filled 2022-06-19 (×5): qty 1

## 2022-06-19 MED ORDER — BUSPIRONE HCL 15 MG PO TABS: 7.5000 mg | ORAL_TABLET | ORAL | Status: DC

## 2022-06-19 MED ORDER — BUSPIRONE HCL 5 MG PO TABS: 7.5000 mg | ORAL_TABLET | Freq: Every day | ORAL | Status: DC | PRN

## 2022-06-19 MED ORDER — APIXABAN 2.5 MG PO TABS
2.5000 mg | ORAL_TABLET | Freq: Two times a day (BID) | ORAL | Status: DC
  Administered 2022-06-19 – 2022-06-24 (×10): 2.5 mg via ORAL
  Filled 2022-06-19 (×10): qty 1

## 2022-06-19 MED ORDER — LATANOPROST 0.005 % OP SOLN
1.0000 [drp] | Freq: Every day | OPHTHALMIC | Status: DC
  Administered 2022-06-19 – 2022-06-23 (×5): 1 [drp] via OPHTHALMIC
  Filled 2022-06-19: qty 2.5

## 2022-06-19 MED ORDER — HYDROCODONE-ACETAMINOPHEN 10-325 MG PO TABS
1.0000 | ORAL_TABLET | Freq: Four times a day (QID) | ORAL | Status: DC | PRN
  Administered 2022-06-20 – 2022-06-23 (×8): 1 via ORAL
  Filled 2022-06-19 (×8): qty 1

## 2022-06-19 MED ORDER — ACETAMINOPHEN 325 MG PO TABS
650.0000 mg | ORAL_TABLET | Freq: Four times a day (QID) | ORAL | Status: DC | PRN
  Administered 2022-06-24: 650 mg via ORAL
  Filled 2022-06-19: qty 2

## 2022-06-19 MED ORDER — FUROSEMIDE 10 MG/ML IJ SOLN
40.0000 mg | Freq: Every day | INTRAMUSCULAR | Status: AC
  Administered 2022-06-20: 40 mg via INTRAVENOUS
  Filled 2022-06-19 (×2): qty 4

## 2022-06-19 MED ORDER — ROPINIROLE HCL 1 MG PO TABS
1.0000 mg | ORAL_TABLET | Freq: Every day | ORAL | Status: DC
  Administered 2022-06-20 – 2022-06-24 (×5): 1 mg via ORAL
  Filled 2022-06-19 (×5): qty 1

## 2022-06-19 MED ORDER — ROPINIROLE HCL 1 MG PO TABS: 1.0000 mg | ORAL_TABLET | ORAL | Status: DC

## 2022-06-19 MED ORDER — ALBUTEROL SULFATE (2.5 MG/3ML) 0.083% IN NEBU: 2.5000 mg | INHALATION_SOLUTION | Freq: Four times a day (QID) | RESPIRATORY_TRACT | Status: DC | PRN

## 2022-06-19 MED ORDER — NEOMYCIN-POLYMYXIN-DEXAMETH 3.5-10000-0.1 OP OINT
1.0000 | TOPICAL_OINTMENT | Freq: Every evening | OPHTHALMIC | Status: DC | PRN
  Administered 2022-06-20: 1 via OPHTHALMIC
  Filled 2022-06-19: qty 3.5

## 2022-06-19 MED ORDER — LOSARTAN POTASSIUM 50 MG PO TABS
25.0000 mg | ORAL_TABLET | Freq: Every day | ORAL | Status: DC
  Administered 2022-06-20: 25 mg via ORAL
  Filled 2022-06-19: qty 1

## 2022-06-19 MED ORDER — HYDRALAZINE HCL 20 MG/ML IJ SOLN: 5.0000 mg | Freq: Four times a day (QID) | INTRAMUSCULAR | Status: DC | PRN

## 2022-06-19 MED ORDER — LEVOTHYROXINE SODIUM 25 MCG PO TABS
125.0000 ug | ORAL_TABLET | Freq: Every day | ORAL | Status: DC
  Administered 2022-06-20 – 2022-06-24 (×5): 125 ug via ORAL
  Filled 2022-06-19 (×5): qty 1

## 2022-06-19 MED ORDER — DAPAGLIFLOZIN PROPANEDIOL 10 MG PO TABS
10.0000 mg | ORAL_TABLET | Freq: Every day | ORAL | Status: DC
  Administered 2022-06-20 – 2022-06-23 (×4): 10 mg via ORAL
  Filled 2022-06-19 (×4): qty 1

## 2022-06-19 MED ORDER — POLYETHYLENE GLYCOL 3350 17 G PO PACK
17.0000 g | PACK | Freq: Every day | ORAL | Status: DC | PRN
  Administered 2022-06-21 – 2022-06-23 (×3): 17 g via ORAL
  Filled 2022-06-19 (×3): qty 1

## 2022-06-19 MED ORDER — IPRATROPIUM BROMIDE 0.06 % NA SOLN: 2.0000 | Freq: Two times a day (BID) | NASAL | Status: DC | PRN

## 2022-06-19 MED ORDER — SENNOSIDES-DOCUSATE SODIUM 8.6-50 MG PO TABS
2.0000 | ORAL_TABLET | Freq: Every day | ORAL | Status: DC
  Administered 2022-06-20 – 2022-06-23 (×5): 2 via ORAL
  Filled 2022-06-19 (×5): qty 2

## 2022-06-19 MED ORDER — PROSIGHT PO TABS
1.0000 | ORAL_TABLET | Freq: Every day | ORAL | Status: DC
  Administered 2022-06-20: 1 via ORAL
  Filled 2022-06-19 (×4): qty 1

## 2022-06-19 MED ORDER — AMIODARONE HCL 100 MG PO TABS
100.0000 mg | ORAL_TABLET | ORAL | Status: DC
  Administered 2022-06-20 – 2022-06-24 (×3): 100 mg via ORAL
  Filled 2022-06-19 (×4): qty 1

## 2022-06-19 MED ORDER — POLYVINYL ALCOHOL 1.4 % OP SOLN
1.0000 [drp] | Freq: Four times a day (QID) | OPHTHALMIC | Status: DC | PRN
  Administered 2022-06-20: 1 [drp] via OPHTHALMIC
  Filled 2022-06-19: qty 15

## 2022-06-19 MED ORDER — PROCHLORPERAZINE EDISYLATE 10 MG/2ML IJ SOLN: 5.0000 mg | Freq: Four times a day (QID) | INTRAMUSCULAR | Status: DC | PRN

## 2022-06-19 NOTE — ED Provider Notes (Signed)
[facility] Provider Note   CSN: [id number] Arrival date & time: 06/19/22  1706     History  Chief Complaint  Patient presents with   Shortness of Breath    Lisa Mcclure is a 89 y.o. female.  This is an 89 year old female with history of hypertension, hypothyroidism, heart failure, A-fib on Eliquis, OSA and COPD presenting to the ED for shortness of breath.  Patient reportedly wears oxygen, 2 L only at night due to her OSA as she cannot tolerate CPAP.  She declines using any oxygen during the day.  She states she woke up this morning and she did not feel well, she felt fatigued and short of breath.  She tried to ambulate but could not due to worsening shortness of breath.  When EMS arrived she was slightly cyanotic, she was 83% when ambulated with exertion per EMS.  She is also experiencing substernal chest discomfort described as a tightness that does not radiate, it is worse with exertion as well.  She was placed on 3 L which greatly helped her breathing.  On arrival to the ED patient was on 4 L nasal cannula breathing comfortably.  She denies any fevers, chills.      Home Medications Prior to Admission medications   Medication Sig Start Date End Date Taking? Authorizing Provider  albuterol (PROVENTIL) (2.5 MG/3ML) 0.083% nebulizer solution Take 3 mLs (2.5 mg total) by nebulization every 6 (six) hours as needed for wheezing or shortness of breath. 07/27/21   Chesley Mires, MD  albuterol (VENTOLIN HFA) 108 (90 Base) MCG/ACT inhaler Inhale 2 puffs into the lungs every 6 (six) hours as needed for wheezing or shortness of breath. 06/10/21   Cantwell, [name] C, PA-C  amiodarone (PACERONE) 100 MG tablet Take 1 tablet (100 mg total) by mouth every other day. 05/10/22   Adrian Prows, MD  apixaban (ELIQUIS) 2.5 MG TABS tablet Take 1 tablet (2.5 mg total) by mouth 2 (two) times daily. 02/25/22   Adrian Prows, MD  atenolol (TENORMIN) 25 MG tablet Take 1  tablet (25 mg total) by mouth daily. 04/25/22 10/22/22  Adrian Prows, MD  budesonide (PULMICORT) 0.5 MG/2ML nebulizer solution Take 2 mLs (0.5 mg total) by nebulization in the morning and at bedtime. 03/22/22   Martyn Ehrich, NP  busPIRone (BUSPAR) 7.5 MG tablet Take 7.5 mg by mouth as needed (anxiety).    [provider]  dapagliflozin propanediol (FARXIGA) 10 MG TABS tablet Take 1 tablet (10 mg total) by mouth daily before breakfast. 01/27/22   Adrian Prows, MD  diclofenac sodium (VOLTAREN) 1 % GEL Apply 2.25 g topically 3 (three) times daily as needed (for pain).  10/23/18   [provider]  furosemide (LASIX) 20 MG tablet Take 2 tablets (40 mg total) by mouth daily as needed for fluid. 06/09/22   Adrian Prows, MD  HYDROcodone-acetaminophen (NORCO) 7.5-325 MG tablet Take 1 tablet by mouth every 6 (six) hours as needed for moderate pain. 04/13/15   Barton Dubois, MD  ipratropium (ATROVENT) 0.03 % nasal spray Place 2 sprays into both nostrils every 12 (twelve) hours. Patient taking differently: Place 2 sprays into both nostrils as needed for rhinitis. 03/22/22   Martyn Ehrich, NP  latanoprost (XALATAN) 0.005 % ophthalmic solution Place 1 drop into both eyes at bedtime.    [provider]  levothyroxine (SYNTHROID) 125 MCG tablet Take 125 mcg by mouth daily before breakfast.     [provider]  losartan (COZAAR) 25 MG tablet Take 1 tablet (25 mg total) by mouth daily. 06/13/22   Adrian Prows, MD  Multiple Vitamins-Minerals (PRESERVISION AREDS 2+MULTI VIT) CAPS Take by mouth.    [provider]  rOPINIRole (REQUIP) 1 MG tablet Take 1 mg by mouth See admin instructions. Take 1 mg by mouth once a day at 1 pm and 2 tablets at 11pm. 06/19/15   [provider]      Allergies    Statins, Atorvastatin, Gabapentin, and Methocarbamol    Review of Systems   Review of Systems  Constitutional:  Positive for fatigue. Negative for chills and fever.   Respiratory:  Positive for shortness of breath and wheezing.   Cardiovascular:  Positive for chest pain. Negative for palpitations.  Gastrointestinal:  Negative for abdominal pain.  Neurological:  Positive for weakness. Negative for dizziness.    Physical Exam Updated Vital Signs BP (!) 225/90 (BP Location: Left Arm)   Pulse (!) 54   Temp 97.7 F (36.5 C) (Oral)   Resp 20   SpO2 100%  Physical Exam Vitals and nursing note reviewed.  Constitutional:      General: She is not in acute distress. HENT:     Head: Normocephalic.  Eyes:     Pupils: Pupils are equal, round, and reactive to light.  Cardiovascular:     Rate and Rhythm: Normal rate and regular rhythm. No extrasystoles are present.    Heart sounds: No murmur heard.    No friction rub. No gallop.  Pulmonary:     Effort: Pulmonary effort is normal.     Breath sounds: Wheezing (Expiratory wheezes at lung bases) and rhonchi (Diffuse) present.  Musculoskeletal:     Right lower leg: Edema (+1) present.     Left lower leg: Edema (+1) present.  Skin:    General: Skin is warm.     Capillary Refill: Capillary refill takes less than 2 seconds.  Neurological:     General: No focal deficit present.     Mental Status: She is alert and oriented to person, place, and time.     ED Results / Procedures / Treatments   Labs (all labs ordered are listed, but only abnormal results are displayed) Labs Reviewed - No data to display  EKG None  Radiology No results found.  Procedures Procedures    Medications Ordered in ED Medications - No data to display  ED Course/ Medical Decision Making/ A&P                             Medical Decision Making Patient presents with shortness of breath since this morning, clinical picture complicated by history of COPD, heart failure, anemia.  Differential diagnosis includes acute heart failure exacerbation, anemia, acute asthma exacerbation, pneumonia, pleural effusion, pneumothorax,  ACS.  Patient is afebrile, hypertensive satting 100 percent on 4 L nasal cannula.  Will obtain serial troponins and EKG to evaluate for ACS.  Chest x-ray ordered to evaluate for pleural effusion, consolidation, pneumothorax.  BNP as well as basic labs including CBC and CMP ordered.  Patient given 1 DuoNeb as she had some slight wheezing at the lung bases.  I personally reviewed and interpreted patient's labs, CMP unremarkable, no electrolyte abnormalities, troponin 16 and repeat troponin 14, CBC unremarkable with no leukocytosis.    I personally reviewed and interpreted patient's chest x-ray, this shows cardiomegaly with pulmonary edema, agree with radiology.  I personally reviewed and interpreted patient's EKG, PR slightly long, QRS, QTc normal, no acute ischemic changes.  Patient's clinical picture and chest x-ray consistent with acute heart failure exacerbation as she is having exertional dyspnea with new oxygen requirement.  Will give patient 40 mg of Lasix and admit to hospital.  I called and discussed this patient's case with the hospitalist team who agreed to admit patient to their service for further management.  Patient was stable upon admission to hospital.  Problems Addressed: Acute on chronic congestive heart failure, unspecified heart failure type [facility]): acute illness or injury that poses a threat to life or bodily functions  Amount and/or Complexity of Data Reviewed Labs: ordered. Decision-making details documented in ED Course. Radiology: ordered and independent interpretation performed. Decision-making details documented in ED Course. ECG/medicine tests: ordered and independent interpretation performed. Decision-making details documented in ED Course.  Risk Prescription drug management. Decision regarding hospitalization.         Final Clinical Impression(s) / ED Diagnoses Final diagnoses:  None    Rx / DC Orders ED Discharge Orders     None          Jimmie Molly, MD 06/19/22 2217    Elnora Morrison, MD 06/20/22 [phone]

## 2022-06-19 NOTE — ED Notes (Signed)
Pt placed in recliner for comfort

## 2022-06-19 NOTE — ED Triage Notes (Signed)
Pt BIB EMS from home for ShOB, weakness, and fatigue usually able to walk with walker and care for self. Was admitted in January where her medications were adjusted. EMS reports crackles in bases, states that pt desats to the 80's with exertion. Pt normally on 2L only at night for sleep apnea. Today she put herself on 3L during the day due to [facility]. A&Ox4. EMS VS:  CBG 126 HR 58 RR 22 97% 4LNC

## 2022-06-19 NOTE — H&P (Signed)
History and Physical  Lisa Mcclure [id number] DOB: 22-Aug-1932 DOA: 06/19/2022  Referring physician:  Dr. Burt Ek, [place] PCP: [name], MD  Outpatient Specialists: Cardiology Patient coming from: Home  Chief Complaint: Shortness of breath   HPI: Lisa Mcclure is a 89 y.o. female with medical history significant for chronic diastolic CHF, permanent atrial fibrillation on Eliquis, COPD, OSA on 2 L nasal cannula at night only, who presented from home via EMS with progressive shortness of breath today and new oxygen requirement.  Associated with generalized weakness.  Normally, the patient is able to do all her ADLs, clean around the house, do her dishes ect.. but was too weak and short of breath to complete these tasks today.  Her daughter was concerned about these changes and called EMS.  Upon EMS arrival, the patient was saturating 83% on room air.  She was placed on 3 L [place] with improvement of her O2 saturation.  In the ED, chest x-ray revealed mild pulmonary edema.  BNP is pending.  The patient received 1 dose of IV Lasix 40 mg x 1.  Also received 1 dose of DuoNebs 3 mL x 1 due to wheezing on lung auscultation.  Due to concern for acute CHF exacerbation, EDP requested admission.  The patient was admitted by [facility], hospitalist service.  ED Course:  Tmax 98.3.  BP 171/53, pulse 81, respiratory 16, saturation 99% on 3 L.  Lab studies essentially unremarkable.  Review of Systems: Review of systems as noted in the HPI. All other systems reviewed and are negative.   Past Medical History:  Diagnosis Date   Acute renal failure superimposed on stage 3b chronic kidney disease ([place]) 06/15/2021   Anemia    h/o hemorrhoidal bleeding and blood transfusion   Chronic diastolic CHF (congestive heart failure) (HCC)    Depression    Diverticulosis    DOE (dyspnea on exertion)    Esophageal reflux    GERD (gastroesophageal reflux disease)    Hypothyroidism    Insomnia    LBP  (low back pain)    OAB (overactive bladder)    Obesity    OSA (obstructive sleep apnea)    Osteoarthritis    Paroxysmal atrial fibrillation (HCC)    Rheumatoid arthritis(714.0)    Shoulder pain, bilateral    Unspecified essential hypertension    Venous insufficiency    Past Surgical History:  Procedure Laterality Date   ABDOMINAL AORTIC ENDOVASCULAR STENT GRAFT N/A 12/20/2019   Procedure: Aortogram including catheter selection of aorta and bilateral iliac arteriogram, Endovascular repair of infrarenal abdominal aortic aneurysm with bifurcated stent graft (26 mm x 14 x 12 main body, right bell bottom with a 20 mm x 10 cm piece, and left bell bottom with a 16 mm x 12 cm piece) ;  Surgeon: Marty Heck, MD;  Location: [facility] OR;  Service: Vascular;  Laterality: N/A;   [place]   BIOPSY  06/18/2021   Procedure: BIOPSY;  Surgeon: Ronnette Juniper, MD;  Location: [facility] ENDOSCOPY;  Service: Gastroenterology;;   bladder abduction-1996  1996   breast biopsy Right 1980   CARDIOVERSION N/A 12/24/2019   Procedure: CARDIOVERSION;  Surgeon: Adrian Prows, MD;  Location: [facility] ENDOSCOPY;  Service: Cardiovascular;  Laterality: N/A;   CARDIOVERSION N/A 04/25/2022   Procedure: CARDIOVERSION;  Surgeon: Adrian Prows, MD;  Location: [facility] ENDOSCOPY;  Service: Cardiovascular;  Laterality: N/A;   [place], [place]  CYSTOCELE REPAIR     ESOPHAGOGASTRODUODENOSCOPY (EGD) WITH PROPOFOL N/A 06/18/2021   Procedure: ESOPHAGOGASTRODUODENOSCOPY (EGD) WITH PROPOFOL;  Surgeon: Ronnette Juniper, MD;  Location: [facility] ENDOSCOPY;  Service: Gastroenterology;  Laterality: N/A;   FLEXIBLE SIGMOIDOSCOPY N/A 04/10/2015   Procedure: FLEXIBLE SIGMOIDOSCOPY;  Surgeon: Arta Silence, MD;  Location: [place] ENDOSCOPY;  Service: Endoscopy;  Laterality: N/A;   knee arthroscopy Right 1996, 2010   KNEE ARTHROSCOPY W/ AUTOGENOUS CARTILAGE IMPLANTATION (ACI) PROCEDURE Left 1994, 1995   REFRACTIVE SURGERY   01/2020   SHOULDER SURGERY  1990   TEE WITHOUT CARDIOVERSION N/A 12/24/2019   Procedure: TRANSESOPHAGEAL ECHOCARDIOGRAM (TEE);  Surgeon: Adrian Prows, MD;  Location: [place];  Service: Cardiovascular;  Laterality: N/A;   [place] Bilateral 12/20/2019   Procedure: Ultrasound-guided access of bilateral common femoral arteries for delivery of endograft and percutaneous closure;  Surgeon: Marty Heck, MD;  Location: [place];  Service: Vascular;  Laterality: Bilateral;   VESICOVAGINAL FISTULA CLOSURE W/ TAH     WRIST SURGERY  1967    Social History:  reports that she quit smoking about 38 years ago. Her smoking use included cigarettes. She has a 2.00 pack-year smoking history. She has been exposed to tobacco smoke. She has never used smokeless tobacco. She reports current alcohol use. She reports that she does not use drugs.   Allergies  Allergen Reactions   Statins Other (See Comments)    Muscle aches and INTERNAL BLEEDING   Atorvastatin Other (See Comments)    Myalgias   Gabapentin Other (See Comments)    "Made me loopy"   Methocarbamol Other (See Comments)    "Made me loopy"    Family History  Problem Relation Age of Onset   Lung cancer Mother    COPD Father    Breast cancer Maternal Aunt    Heart attack Maternal Grandmother 59   Lung cancer Son       Prior to Admission medications   Medication Sig Start Date End Date Taking? Authorizing Provider  albuterol (PROVENTIL) (2.5 MG/3ML) 0.083% nebulizer solution Take 3 mLs (2.5 mg total) by nebulization every 6 (six) hours as needed for wheezing or shortness of breath. 07/27/21  Yes Chesley Mires, MD  ALEVE ARTHRITIS PAIN 1 % GEL Apply 2 g topically 4 (four) times daily as needed (for pain).   Yes [provider]  amiodarone (PACERONE) 100 MG tablet Take 1 tablet (100 mg total) by mouth every other day. 05/10/22  Yes Adrian Prows, MD  atenolol (TENORMIN)  25 MG tablet Take 1 tablet (25 mg total) by mouth daily. 04/25/22 10/22/22 Yes Adrian Prows, MD  budesonide (PULMICORT) 0.5 MG/2ML nebulizer solution Take 2 mLs (0.5 mg total) by nebulization in the morning and at bedtime. Patient taking differently: Take 0.5 mg by nebulization daily. 03/22/22  Yes Martyn Ehrich, NP  diclofenac sodium (VOLTAREN) 1 % GEL Apply 2.25 g topically 3 (three) times daily as needed (for pain).  10/23/18  Yes [provider]  furosemide (LASIX) 20 MG tablet Take 2 tablets (40 mg total) by mouth daily as needed for fluid. 06/09/22  Yes Adrian Prows, MD  HYDROcodone-acetaminophen (NORCO) 10-325 MG tablet Take 1 tablet by mouth every 6 (six) hours as needed (FOR PAIN).   Yes [provider]  rOPINIRole (REQUIP) 1 MG tablet Take 1 mg by mouth See admin instructions. Take 1 mg by mouth at 1 PM and 2 mg at 9 PM 06/19/15  Yes [provider]  albuterol (VENTOLIN HFA) 108 (90 Base) MCG/ACT inhaler Inhale 2 puffs into the lungs every 6 (six) hours as needed for wheezing or shortness of breath. Patient not taking: Reported on 06/19/2022 06/10/21   Lawerance Cruel C, PA-C  apixaban (ELIQUIS) 2.5 MG TABS tablet Take 1 tablet (2.5 mg total) by mouth 2 (two) times daily. 02/25/22   Adrian Prows, MD  busPIRone (BUSPAR) 7.5 MG tablet Take 7.5 mg by mouth as needed (anxiety).    [provider]  dapagliflozin propanediol (FARXIGA) 10 MG TABS tablet Take 1 tablet (10 mg total) by mouth daily before breakfast. 01/27/22   Adrian Prows, MD  HYDROcodone-acetaminophen (NORCO) 7.5-325 MG tablet Take 1 tablet by mouth every 6 (six) hours as needed for moderate pain. Patient not taking: Reported on 06/19/2022 04/13/15   Barton Dubois, MD  ipratropium (ATROVENT) 0.03 % nasal spray Place 2 sprays into both nostrils every 12 (twelve) hours. Patient taking differently: Place 2 sprays into both nostrils as needed for rhinitis. 03/22/22   Martyn Ehrich, NP  latanoprost  (XALATAN) 0.005 % ophthalmic solution Place 1 drop into both eyes at bedtime.    [provider]  levothyroxine (SYNTHROID) 125 MCG tablet Take 125 mcg by mouth daily before breakfast.     [provider]  losartan (COZAAR) 25 MG tablet Take 1 tablet (25 mg total) by mouth daily. 06/13/22   Adrian Prows, MD  Multiple Vitamins-Minerals (PRESERVISION AREDS 2+MULTI VIT) CAPS Take by mouth.    [provider]    Physical Exam: BP (!) 163/66   Pulse (!) 53   Temp 98.3 F (36.8 C) (Oral)   Resp 16   Ht '5\' 3"'$  (1.6 m)   Wt 92.5 kg   SpO2 100%   BMI 36.14 kg/m   General: 89 y.o. year-old female well developed well nourished in no acute distress.  Alert and oriented x3. Cardiovascular: Regular rate and rhythm with no rubs or gallops.  No thyromegaly or JVD noted. 1+ pitting edema in lower extremity bilaterally. 2/4 pulses in all 4 extremities. Respiratory: Mild diffuse rales and wheezes bilaterally. Good inspiratory effort. Abdomen: Soft nontender nondistended with normal bowel sounds x4 quadrants. Muskuloskeletal: No cyanosis or clubbing noted bilaterally Neuro: CN II-XII intact, strength, sensation, reflexes Skin: No ulcerative lesions noted or rashes Psychiatry: Judgement and insight appear normal. Mood is appropriate for condition and setting          Labs on Admission:  Basic Metabolic Panel: Recent Labs  Lab 06/19/22 1720  NA 138  K 4.0  CL 99  CO2 31  GLUCOSE 100*  BUN 13  CREATININE 0.83  CALCIUM 8.7*   Liver Function Tests: Recent Labs  Lab 06/19/22 1720  AST 40  ALT 35  ALKPHOS 89  BILITOT 0.5  PROT 7.6  ALBUMIN 3.5   No results for input(s): "LIPASE", "AMYLASE" in the last 168 hours. No results for input(s): "AMMONIA" in the last 168 hours. CBC: Recent Labs  Lab 06/19/22 1720  WBC 9.4  NEUTROABS 7.6  HGB 13.8  HCT 44.7  MCV 99.8  PLT 250   Cardiac Enzymes: No results for input(s): "CKTOTAL", "CKMB", "CKMBINDEX", "TROPONINI"  in the last 168 hours.  BNP (last 3 results) Recent Labs    04/22/22 1453 04/23/22 0030 04/24/22 0050  BNP 733.7* 1,056.8* 855.1*    ProBNP (last 3 results) No results for input(s): "PROBNP" in the last 8760 hours.  CBG: No results for input(s): "GLUCAP" in  the last 168 hours.  Radiological Exams on Admission: DG Chest Portable 1 View  Result Date: 06/19/2022 CLINICAL DATA:  Shortness of breath EXAM: PORTABLE CHEST 1 VIEW COMPARISON:  07/27/2021 chest radiograph and prior studies FINDINGS: This is a low volume study. Cardiomegaly and mild pulmonary vascular congestion noted. Mild bibasilar opacities are present. There is no evidence of pneumothorax or large pleural effusion. No acute bony abnormalities are identified. Degenerative changes in the shoulders again noted. IMPRESSION: Cardiomegaly with mild pulmonary vascular congestion and mild bibasilar opacities which may represent atelectasis or airspace disease/pneumonia. Electronically Signed   By: Margarette Canada M.D.   On: 06/19/2022 17:44    EKG: I independently viewed the EKG done and my findings are as followed: Atrial fibrillation rate of 54. Nonspecific ST-T changes.  QTc 434.  Assessment/Plan Present on Admission: **None**  Principal Problem:   Acute exacerbation of CHF (congestive heart failure) (HCC)  Acute on chronic diastolic CHF Personally reviewed chest x-ray done on admission.  It revealed pulmonary edema. BNP is pending. Started on IV Lasix in the ED, 40 mg x 1. Continue IV diuresing, closely monitor urine output with strict I's and O's Closely monitor renal function and electrolytes. Start strict I's and O's and daily weight. Follow 2D echo.  Acute on chronic hypoxic respiratory failure At baseline on 2 L nasal cannula nightly Currently requiring 3 L to maintain O2 saturation greater than 90%. Wean off O2 supplementation as tolerated Bronchodilators Incentive spirometer Early mobilization  Persistent  atrial fibrillation on Eliquis Currently in slow ventricular response with heart rate in the 50s Home Toprol-XL held to avoid worsening bradycardia. Home amiodarone restarted 100 mg every other day. Resume home Eliquis for CVA prevention. Obtain TSH Monitor on telemetry  COPD Resume home bronchodilators Incentive spirometer Maintain O2 saturation greater 90%.  OSA, could not tolerate CPAP On 2 L nasal cannula nightly Follow-up 2D echo  Hypothyroidism Obtain TSH Resume home levothyroxine.  Chronic anxiety/depression Stable Resume home buspirone  Restless leg syndrome Stable Resume home Requip.  Obesity BMI 36 Recommend weight loss outpatient with regular physical activity and healthy dieting.  Generalized weakness, suspect contributed by acute CHF PT OT evaluation Obtain vitamin D level. Ambulates with a walker at baseline. Fall precautions   DVT prophylaxis: Home Eliquis  Code Status: Full code  Family Communication: Updated her daughter at bedside.  Disposition Plan: Admitted to telemetry cardiac unit.  Consults called: Cardiology Dr. Virgina Jock consulted.  The patient follows with Dr. Einar Gip outpatient.  Admission status: Inpatient status.   Status is: Inpatient The patient requires at least 2 midnights for further evaluation and treatment of present condition.   Kayleen Memos MD [facility] Pager [phone]  If 7PM-7AM, please contact night-coverage [email or web address] Password [facility]  06/20/2022, 12:20 AM

## 2022-06-20 ENCOUNTER — Inpatient Hospital Stay (HOSPITAL_COMMUNITY): Payer: Medicare Other

## 2022-06-20 DIAGNOSIS — E039 Hypothyroidism, unspecified: Secondary | ICD-10-CM | POA: Diagnosis not present

## 2022-06-20 DIAGNOSIS — I5033 Acute on chronic diastolic (congestive) heart failure: Secondary | ICD-10-CM | POA: Diagnosis not present

## 2022-06-20 DIAGNOSIS — I5031 Acute diastolic (congestive) heart failure: Secondary | ICD-10-CM | POA: Diagnosis not present

## 2022-06-20 DIAGNOSIS — I10 Essential (primary) hypertension: Secondary | ICD-10-CM | POA: Diagnosis not present

## 2022-06-20 LAB — ECHOCARDIOGRAM COMPLETE
AR max vel: 0.88 cm2
AV Area VTI: 0.84 cm2
AV Area mean vel: 0.81 cm2
AV Mean grad: 18 mmHg
AV Peak grad: 30.9 mmHg
Ao pk vel: 2.78 m/s
Area-P 1/2: 2.96 cm2
Height: 63 in
S' Lateral: 2.7 cm
Weight: 3264 oz

## 2022-06-20 LAB — BASIC METABOLIC PANEL
Anion gap: 7 (ref 5–15)
BUN: 17 mg/dL (ref 8–23)
CO2: 34 mmol/L — ABNORMAL HIGH (ref 22–32)
Calcium: 8.5 mg/dL — ABNORMAL LOW (ref 8.9–10.3)
Chloride: 98 mmol/L (ref 98–111)
Creatinine, Ser: 1.08 mg/dL — ABNORMAL HIGH (ref 0.44–1.00)
GFR, Estimated: 50 mL/min — ABNORMAL LOW (ref >60)
Glucose, Bld: 132 mg/dL — ABNORMAL HIGH (ref 70–99)
Potassium: 3.8 mmol/L (ref 3.5–5.1)
Sodium: 139 mmol/L (ref 135–145)

## 2022-06-20 LAB — CBC
HCT: 43.4 % (ref 36.0–46.0)
Hemoglobin: 13.5 g/dL (ref 12.0–15.0)
MCH: 31 pg (ref 26.0–34.0)
MCHC: 31.1 g/dL (ref 30.0–36.0)
MCV: 99.8 fL (ref 80.0–100.0)
Platelets: 236 10*3/uL (ref 150–400)
RBC: 4.35 MIL/uL (ref 3.87–5.11)
RDW: 14.6 % (ref 11.5–15.5)
WBC: 9.8 10*3/uL (ref 4.0–10.5)
nRBC: 0 % (ref 0.0–0.2)

## 2022-06-20 LAB — TSH: TSH: 1.195 u[IU]/mL (ref 0.350–4.500)

## 2022-06-20 LAB — PHOSPHORUS: Phosphorus: 5.1 mg/dL — ABNORMAL HIGH (ref 2.5–4.6)

## 2022-06-20 LAB — MAGNESIUM: Magnesium: 2.1 mg/dL (ref 1.7–2.4)

## 2022-06-20 LAB — BRAIN NATRIURETIC PEPTIDE: B Natriuretic Peptide: 1428.4 pg/mL — ABNORMAL HIGH (ref 0.0–100.0)

## 2022-06-20 MED ORDER — IPRATROPIUM-ALBUTEROL 0.5-2.5 (3) MG/3ML IN SOLN
3.0000 mL | Freq: Four times a day (QID) | RESPIRATORY_TRACT | Status: DC
  Administered 2022-06-20 – 2022-06-24 (×16): 3 mL via RESPIRATORY_TRACT
  Filled 2022-06-20 (×16): qty 3

## 2022-06-20 MED ORDER — LOSARTAN POTASSIUM 50 MG PO TABS
50.0000 mg | ORAL_TABLET | Freq: Every day | ORAL | Status: DC
  Administered 2022-06-21: 50 mg via ORAL
  Filled 2022-06-20: qty 1

## 2022-06-20 MED ORDER — LOSARTAN POTASSIUM 50 MG PO TABS
25.0000 mg | ORAL_TABLET | Freq: Once | ORAL | Status: AC
  Administered 2022-06-20: 25 mg via ORAL
  Filled 2022-06-20: qty 1

## 2022-06-20 NOTE — Hospital Course (Addendum)
Lisa Mcclure was admitted to the hospital with the working diagnosis of heart failure exacerbation.   89 yo female with the past medical history of heart failure, atrial fibrillation, COPD, and OSA who presented with dyspnea. Recent outpatient decreased dose of losartan. Patient had a rapid onset of dyspnea and generalized weakness. Severe symptoms that prompted her daughter to call EMS. She was found hypoxemic with 02 saturation 83% on room air, she was placed on supplemental 02 per [place] 3 L.min and was transported to the ED. On her initial physical examination in the emergency room her blood pressure was 225/90, 171/53, HR 81, RR 16 and 02 saturation 93% on 3 L/min per [place], lungs with diffuse rales and wheezing bilaterally, with no rhonchi, heart with S1 and S2 present and regular, abdomen with no distention, positive lower extremity edema.   Na 138, K 4,0 Cl 99, bicarbonate 31 glucose 100, bun 13 cr 0,83  BNP 1,428  High sensitive troponin 16 and 14  Wbc 9,4 hgb 13,8 plt 250   Chest radiograph with mid hyperinflation, with bilateral hilar vascular congestion and bilateral atelectasis.  EKG 54 bpm, normal axis, normal intervals, sinus rhythm with PAC, poor R R wave progression with no significant ST segment or T wave changes.   Patient has been placed on IV furosemide with improvement in her symptoms.  Renal artery duplex US with no significant renal artery stenosis.

## 2022-06-20 NOTE — ED Notes (Addendum)
Daughter states that pt is supposed to take '100mg'$  of Amioderone every other day and states that she did not take it on 3/10. Educated on pt currently having a low heart rate and the need for reassessment. DO Hall made aware.

## 2022-06-20 NOTE — Consult Note (Signed)
CARDIOLOGY CONSULT NOTE  Patient ID: AQUINNAH VUNCANNON MRN: [id number] DOB/AGE: 01-03-1933 89 y.o.  Admit date: 06/19/2022 Referring Physician: [facility] Reason for Consultation:  CHF  HPI:   89 year old Caucasian female with hypertension, PAF PAF, CKD stage III, OSA on CPAP, rheumatoid arthritis, venous deficiency, s/p AAA repair, admitted with acute on chronic HFpEF  Patient was seen by Dr. Einar Gip on 06/09/2022, and had been doing well with resolution of leg edema.  Patient presented to [facility] ER on 06/19/2022 with complaints of shortness of breath with minimal exertion and generalized weakness.  EMS were called, found patient to be hypoxic on 83% on room air.  She was also extremely hypertensive with blood pressure 225/90 mmHg.  Workup so far has been significant for BNP of 1428, Chest x-ray with mild pulmonary vascular congestion and mild bibasilar opacities, CT chest predominantly show impression groundglass opacities in posterior aspect of the right upper and lower lobes concerning for multifocal pneumonia.  At baseline, she is only on losartan 25 mg daily, and atenolol 25 mg daily for blood pressure control with most outpatient office visit blood pressures <150/90 mmHg.  Past Medical History:  Diagnosis Date   Acute renal failure superimposed on stage 3b chronic kidney disease ([place]) 06/15/2021   Anemia    h/o hemorrhoidal bleeding and blood transfusion   Chronic diastolic CHF (congestive heart failure) (HCC)    Depression    Diverticulosis    DOE (dyspnea on exertion)    Esophageal reflux    GERD (gastroesophageal reflux disease)    Hypothyroidism    Insomnia    LBP (low back pain)    OAB (overactive bladder)    Obesity    OSA (obstructive sleep apnea)    Osteoarthritis    Paroxysmal atrial fibrillation (HCC)    Rheumatoid arthritis(714.0)    Shoulder pain, bilateral    Unspecified essential hypertension    Venous insufficiency      Past Surgical History:   Procedure Laterality Date   ABDOMINAL AORTIC ENDOVASCULAR STENT GRAFT N/A 12/20/2019   Procedure: Aortogram including catheter selection of aorta and bilateral iliac arteriogram, Endovascular repair of infrarenal abdominal aortic aneurysm with bifurcated stent graft (26 mm x 14 x 12 main body, right bell bottom with a 20 mm x 10 cm piece, and left bell bottom with a 16 mm x 12 cm piece) ;  Surgeon: Marty Heck, MD;  Location: [facility] OR;  Service: Vascular;  Laterality: N/A;   [place]   BIOPSY  06/18/2021   Procedure: BIOPSY;  Surgeon: Ronnette Juniper, MD;  Location: [facility] ENDOSCOPY;  Service: Gastroenterology;;   bladder abduction-1996  1996   breast biopsy Right 1980   CARDIOVERSION N/A 12/24/2019   Procedure: CARDIOVERSION;  Surgeon: Adrian Prows, MD;  Location: [facility] ENDOSCOPY;  Service: Cardiovascular;  Laterality: N/A;   CARDIOVERSION N/A 04/25/2022   Procedure: CARDIOVERSION;  Surgeon: Adrian Prows, MD;  Location: [facility] ENDOSCOPY;  Service: Cardiovascular;  Laterality: N/A;   [place]     ESOPHAGOGASTRODUODENOSCOPY (EGD) WITH PROPOFOL N/A 06/18/2021   Procedure: ESOPHAGOGASTRODUODENOSCOPY (EGD) WITH PROPOFOL;  Surgeon: Ronnette Juniper, MD;  Location: [facility] ENDOSCOPY;  Service: Gastroenterology;  Laterality: N/A;   FLEXIBLE SIGMOIDOSCOPY N/A 04/10/2015   Procedure: FLEXIBLE SIGMOIDOSCOPY;  Surgeon: Arta Silence, MD;  Location: [facility] ENDOSCOPY;  Service: Endoscopy;  Laterality: N/A;   knee arthroscopy Right 1996, 2010   KNEE ARTHROSCOPY W/  AUTOGENOUS CARTILAGE IMPLANTATION (ACI) PROCEDURE Left 1994, 1995   REFRACTIVE SURGERY  01/2020   SHOULDER SURGERY  1990   TEE WITHOUT CARDIOVERSION N/A 12/24/2019   Procedure: TRANSESOPHAGEAL ECHOCARDIOGRAM (TEE);  Surgeon: Adrian Prows, MD;  Location: [facility] ENDOSCOPY;  Service: Cardiovascular;  Laterality: N/A;   [place] ACCESS  Bilateral 12/20/2019   Procedure: Ultrasound-guided access of bilateral common femoral arteries for delivery of endograft and percutaneous closure;  Surgeon: Marty Heck, MD;  Location: [facility] OR;  Service: Vascular;  Laterality: Bilateral;   VESICOVAGINAL FISTULA CLOSURE W/ TAH     WRIST SURGERY  1967      Family History  Problem Relation Age of Onset   Lung cancer Mother    COPD Father    Breast cancer Maternal Aunt    Heart attack Maternal Grandmother 23   Lung cancer Son      Social History: Social History   Socioeconomic History   Marital status: Widowed    Spouse name: Not on file   Number of children: 3   Years of education: College   Highest education level: Bachelor's degree (e.g., BA, AB, BS)  Occupational History   Occupation: Retired  Tobacco Use   Smoking status: Former    Packs/day: 0.20    Years: 10.00    Total pack years: 2.00    Types: Cigarettes    Quit date: 04/11/1984    Years since quitting: 38.2    Passive exposure: Past   Smokeless tobacco: Never  Vaping Use   Vaping Use: Never used  Substance and Sexual Activity   Alcohol use: Yes    Alcohol/week: 0.0 standard drinks of alcohol    Comment: cocktail once a week   Drug use: No   Sexual activity: Not Currently  Other Topics Concern   Not on file  Social History Narrative   Widowed   Lives alone   3 children, 2 living   OCCUPATION: retired from Physicist, medical, 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 [place] May 2016 for 11 days   Drinks 1 cup of coffee in the morning   Social Determinants of Health   Financial Resource Strain: Low Risk  (08/11/2021)   Overall Financial Resource Strain (CARDIA)    Difficulty of Paying Living Expenses: Not hard at all  Food Insecurity: No Food Insecurity (04/22/2022)   Hunger Vital Sign    Worried About Running Out of Food in the Last Year: Never true    Ran Out of Food in the Last Year: Never true  Transportation Needs: No Transportation Needs  (04/22/2022)   PRAPARE - Hydrologist (Medical): No    Lack of Transportation (Non-Medical): No  Physical Activity: Inactive (08/11/2021)   Exercise Vital Sign    Days of Exercise per Week: 0 days    Minutes of Exercise per Session: 0 min  Stress: No Stress Concern Present (08/11/2021)   [place]    Feeling of Stress : Only a little  Social Connections: Moderately Isolated (08/11/2021)   Social Connection and Isolation Panel [NHANES]    Frequency of Communication with Friends and Family: More than three times a week    Frequency of Social Gatherings with Friends and Family: More than three times a week    Attends Religious Services: More than 4 times per year    Active Member of Clubs or Organizations: No  Attends Archivist Meetings: Never    Marital Status: Widowed  Intimate Partner Violence: Not At Risk (04/22/2022)   Humiliation, Afraid, Rape, and Kick questionnaire    Fear of Current or Ex-Partner: No    Emotionally Abused: No    Physically Abused: No    Sexually Abused: No     (Not in a hospital admission)   Review of Systems  Cardiovascular:  Positive for dyspnea on exertion (Present on admission, now improving). Negative for chest pain, leg swelling, palpitations and syncope.      Physical Exam: Physical Exam Vitals and nursing note reviewed.  Constitutional:      General: She is not in acute distress. Neck:     Vascular: No JVD.  Cardiovascular:     Rate and Rhythm: Normal rate and regular rhythm.     Heart sounds: Normal heart sounds. No murmur heard. Pulmonary:     Effort: Pulmonary effort is normal.     Breath sounds: Normal breath sounds. No wheezing or rales.  Musculoskeletal:     Right lower leg: Edema (Trace) present.     Left lower leg: Edema (Trace) present.        Imaging/tests reviewed and independently interpreted: Lab Results: CBC, BMP,  BNP  CT chest 06/19/2022: 1. Patchy and ground-glass opacities in the posterior aspect right upper and lower lobes concerning for multifocal pneumonia. Follow-up examination to resolution is recommended. 2.  Heart is enlarged.  No pericardial effusion. 3. Increase in AP diameter of the chest, likely secondary to chronic obstructive lung disease. 4. Degenerate disc disease of the thoracic spine. No acute osseous abnormality. 5.  Moderate fatty infiltration of the pancreas. 6. Increased density of the liver, correlate with history of amiodarone intake. Aortic Atherosclerosis (ICD10-I70.0).     CXR 06/19/2022: Cardiomegaly with mild pulmonary vascular congestion and mild bibasilar opacities which may represent atelectasis or airspace disease/pneumonia.    Cardiac Studies:  Telemetry 06/20/2022: No significant arrhythmia  EKG 06/19/2022: Sinus rhythm 54 bpm  Echocardiogram 02/08/2022: Normal LV systolic function with visual EF 55-60%. Left ventricle cavity is normal in size. Moderate concentric hypertrophy of the left ventricle. Normal global wall motion. Indeterminate diastolic filling pattern. Left atrial cavity is severely dilated at 46.9 ml/m^2. Trileaflet aortic valve. Moderate aortic stenosis. Mild (Grade I) aortic regurgitation. Mild aortic valve leaflet calcification. AVA (VTI) measures 0.8 cm^2. AV Mean Grad measures 22.9 mmHg. AV Pk Vel measures 3.28 m/s. Mild (Grade I) mitral regurgitation. Mild calcification of the mitral valve annulus. Mild mitral valve leaflet thickening with mild calcification. Mildly restricted mitral valve leaflets. Structurally normal tricuspid valve with trace regurgitation. No evidence of pulmonary hypertension. Compared to 04/2021, aortic stenosis is still moderate and there is mild aortic regurgitation present.  Ambulatory cardiac telemetry 14 days 03/22/2021 - 04/04/2021: Predominant underlying rhythm was sinus with first-degree AV block.  Patient  with 19 brief episodes of SVT which were asymptomatic.  Frequent PVCs with 20.4% burden and episodes of ventricular bigeminy and trigeminy which correlated with patient triggered events.  No evidence of atrial fibrillation, ventricular tachycardia, pauses >3 seconds, or high degree AV block.  Assessment & Recommendations:   89 year old Caucasian female with hypertension, PAF PAF, CKD stage III, OSA on CPAP, rheumatoid arthritis, venous deficiency, s/p AAA repair, admitted with acute on chronic HFpEF  Acute on chronic HFpEF: Excellent control of heart failure until 06/19/2022, when she went into mild pulmonary edema.  I suspect this episode was an hypertensive emergency with blood pressure 220/90  mmHg.  BNP elevation and mild pulmonary edema, likely result of hypertensive urgency.  Blood pressure much better, still elevated.  She only has minimal leg edema on exam at this time, no JVD. Continue IV Lasix 40 mg daily for now. Continue Farxiga 10 mg daily. Strict I's/O, daily weights. See below regarding hypertension management  Hypertension: Presentation with hypertensive urgency. Currently on losartan 25 mg daily, atenolol 25 mg daily. Recommend checking renal artery duplex fasting. For now, increase losartan to 50 mg daily.  Down the road, could consider adding spironolactone.   Discussed interpretation of tests and management recommendations with the primary team     Nigel Mormon, MD Pager: [phone] Office: [phone]

## 2022-06-20 NOTE — Progress Notes (Signed)
Progress Note   Patient: Lisa Mcclure [id number] DOB: October 07, 1932 DOA: 06/19/2022     1 DOS: the patient was seen and examined on 06/20/2022   Brief hospital course: Mrs. Hutcheson was admitted to the hospital with the working diagnosis of heart failure exacerbation.   89 yo female with the past medical history of heart failure, atrial fibrillation, COPD, and OSA who presented with dyspnea. Patient had a rapid onset of dyspnea and generalized weakness. Severe symptoms that prompted her daughter to call EMS. She was found hypoxemic with 02 saturation 83% on room air, she was placed on supplemental 02 per [place] 3 L.min and was transported to the ED. On her initial physical examination in the emergency room her blood pressure was 225/90, 171/53, HR 81, RR 16 and 02 saturation 93% on 3 L/min per [place], lungs with diffuse rales and wheezing bilaterally, with no rhonchi, heart with S1 and S2 present and regular, abdomen with no distention, positive lower extremity edema.   Na 138, K 4,0 Cl 99, bicarbonate 31 glucose 100, bun 13 cr 0,83  BNP 1,428  High sensitive troponin 16 and 14  Wbc 9,4 hgb 13,8 plt 250   Chest radiograph with mid hyperinflation, with bilateral hilar vascular congestion and bilateral atelectasis.  EKG 54 bpm, normal axis, normal intervals, sinus rhythm with PAC, poor R R wave progression with no significant ST segment or T wave changes.     Assessment and Plan: * Acute on chronic diastolic heart failure (HCC) Echocardiogram with preserved LV systolic function with EF 60 to 65%, moderate LVH, preserved RV systolic function, moderate LA dilatation, moderate to severe aortic stenosis.   Continue diuresis with IV furosemide.   Continue blood pressure control with losartan, continue with atenolol.  Follow up response to diuresis.    Hypertension Uncontrolled hypertension, hypertensive emergency.   Recently losartan was decreased to 25 mg from 50 mg   Plan to continue  diuresis with furosemide, increase losartan to 50 mg Continue blood pressure monitoring.  Follow up renal artery duplex ultrasound.   Paroxysmal atrial fibrillation (HCC) Continue rate control with amiodarone and atenolol Anticoagulation with apixaban.  Continue telemetry monitoring.   Hypothyroidism Continue levothyroxine   OSA (obstructive sleep apnea) Cpap  Class 2 obesity Calculated BMI is 36,1         Subjective: Patient is feeling better but not yet back to baseline, dyspnea is improving, positive pain at her lower extremities,   Physical Exam: Vitals:   06/20/22 1325 06/20/22 1400 06/20/22 1500 06/20/22 1600  BP: (!) 154/65 (!) 167/72 (!) 167/72 (!) 134/50  Pulse: (!) 57 66 60 61  Resp: 13 (!) 34 15 19  Temp:    98.2 F (36.8 C)  TempSrc:      SpO2: 98% 93% 92% 96%  Weight:      Height:       Neurology awake and alert ENT with mild pallor Cardiovascular with S1 and S2 present irregularly irregular with positive systolic murmur at the base, no rubs and no gallops. Respiratory with mild rales at lower lobes, positive prolonged expiratory phase with no wheezing. Positive kyphosis Abdomen with no distention  Data Reviewed:    Family Communication: I spoke with patient's daughter  at the bedside, we talked in detail about patient's condition, plan of care and prognosis and all questions were addressed.   Disposition: Status is: Inpatient Remains inpatient appropriate because: heart failure   Planned Discharge Destination: Home      Author:  Ophelia Sipe Gerome Apley, MD 06/20/2022 4:26 PM  For on call review [email or web address].

## 2022-06-20 NOTE — Assessment & Plan Note (Addendum)
Echocardiogram with preserved LV systolic function with EF 60 to 65%, moderate LVH, preserved RV systolic function, moderate LA dilatation, moderate to severe aortic stenosis.   Urine output not documented. Systolic blood pressure is 150 to 160 mmHg.   Transitioned to entresto, continue with dapagliflozin. Added carvedilol.  If blood pressure and renal function stable, consider adding spironolactone.  Continue diuresis with furosemide.

## 2022-06-20 NOTE — Assessment & Plan Note (Signed)
Cpap

## 2022-06-20 NOTE — Progress Notes (Signed)
Patient was also seen by Dr. Virgina Jock - see his consult note.   No charge.   Rex Kras, [place], [facility]  Pager:  [phone] Office: [phone]

## 2022-06-20 NOTE — Assessment & Plan Note (Signed)
Continue levothyroxine 

## 2022-06-20 NOTE — ED Notes (Signed)
Md and family at bedside

## 2022-06-20 NOTE — Progress Notes (Signed)
Echocardiogram 2D Echocardiogram has been performed.  Lisa Mcclure 06/20/2022, 1:14 PM

## 2022-06-20 NOTE — ED Notes (Signed)
Patient was placed in hospital bed, new purewick applied, patient repositioned for comfort.

## 2022-06-20 NOTE — Assessment & Plan Note (Signed)
Continue rate control with amiodarone and carvedilol.  Anticoagulation with apixaban.  Continue telemetry monitoring.

## 2022-06-20 NOTE — Progress Notes (Signed)
Heart Failure Navigator Progress Note  Assessed for Heart & Vascular TOC clinic readiness.  Patient does not meet criteria due to [facility] cardiology.   Navigator will sign off at this time.    Omari Koslosky, BSN, RN Heart Failure Nurse Navigator Secure Chat Only   

## 2022-06-20 NOTE — Assessment & Plan Note (Signed)
Calculated BMI is 36,1   Restless leg syndrome, continue with ropinarole. Depression and anxiety, continue with buspirone.

## 2022-06-20 NOTE — Progress Notes (Signed)
PT Cancellation Note  Patient Details Name: Lisa Mcclure MRN: [id number] DOB: [birth year]   Cancelled Treatment:    Reason Eval/Treat Not Completed: Other (comment).  Refused PT due to having walked with OT on the hallway.  Follow up as time and pt allow.   Ramond Dial 06/20/2022, 11:25 AM  Mee Hives, PT PhD Acute Rehab Dept. Number: [place] and [place]

## 2022-06-20 NOTE — Evaluation (Signed)
Occupational Therapy Evaluation Patient Details Name: Lisa Mcclure MRN: [id number] DOB: 06-20-1932 Today's Date: 06/20/2022   History of Present Illness Lisa Mcclure is a 89 y.o. female who presented from home via EMS with progressive shortness of breath today and new oxygen requirement. Upon arrival  the patient was saturating 83% on RA; chest x-ray revealed mild pulmonary edema. PMHx: chronic diastolic CHF, permanent atrial fibrillation on Eliquis, COPD, OSA on 2 L nasal cannula at night only   Clinical Impression   Lisa Mcclure was evaluated s/p the above admission list. She reports being mod I with AD at baseline. Upon evaluation she was limited by new O2 demand, chronic bilateral shoulder pain/immobility, generalized weakness, incontinence, unsteady balance and decreased activity tolerance. Overall she required close min G for bed mobility and simple tranfers with RW. Due to the deficits listed below she also requires up to min A for UB ADLs and mod A for LB ADLs. Pt will benefit from continued acute OT services. Recommend d/c to home with [place], anticipate pt is close to her functional baseline and will progress well.       Recommendations for follow up therapy are one component of a multi-disciplinary discharge planning process, led by the attending physician.  Recommendations may be updated based on patient status, additional functional criteria and insurance authorization.   Follow Up Recommendations  Home health OT     Assistance Recommended at Discharge Intermittent Supervision/Assistance  Patient can return home with the following A little help with walking and/or transfers;A little help with bathing/dressing/bathroom;Assistance with cooking/housework;Assist for transportation;Help with stairs or ramp for entrance    Functional Status Assessment  Patient has had a recent decline in their functional status and demonstrates the ability to make significant improvements in function  in a reasonable and predictable amount of time.  Equipment Recommendations  BSC/3in1       Precautions / Restrictions Precautions Precautions: Fall Restrictions Weight Bearing Restrictions: No      Mobility Bed Mobility Overal bed mobility: Needs Assistance Bed Mobility: Rolling, Sidelying to Sit, Sit to Supine Rolling: Min guard Sidelying to sit: Min guard   Sit to supine: Min guard        Transfers Overall transfer level: Needs assistance Equipment used: Rolling walker (2 wheels) Transfers: Sit to/from Stand Sit to Stand: Min guard                  Balance Overall balance assessment: Needs assistance Sitting-balance support: Feet supported Sitting balance-Leahy Scale: Good     Standing balance support: Bilateral upper extremity supported, During functional activity Standing balance-Leahy Scale: Poor                             ADL either performed or assessed with clinical judgement   ADL Overall ADL's : Needs assistance/impaired Eating/Feeding: Set up;Sitting   Grooming: Set up;Sitting   Upper Body Bathing: Minimal assistance;Sitting   Lower Body Bathing: Moderate assistance;Sit to/from stand   Upper Body Dressing : Minimal assistance;Sitting   Lower Body Dressing: Moderate assistance;Sit to/from stand   Toilet Transfer: Min guard;Stand-pivot;Rolling walker (2 wheels)   Toileting- Clothing Manipulation and Hygiene: Maximal assistance;Sit to/from stand       Functional mobility during ADLs: Min guard;Rolling walker (2 wheels) General ADL Comments: limited by bilateral shoulder problems, generalized weakness and unsteady gait     Vision Baseline Vision/History: 1 Wears glasses Vision Assessment?: No apparent visual deficits  Perception Perception Perception Tested?: No   Praxis Praxis Praxis tested?: Not tested    Pertinent Vitals/Pain Pain Assessment Pain Assessment: Faces Faces Pain Scale: Hurts little more Pain  Location: bilateral shoulders Pain Descriptors / Indicators: Discomfort Pain Intervention(s): Limited activity within patient's tolerance, Monitored during session     Hand Dominance Right   Extremity/Trunk Assessment Upper Extremity Assessment Upper Extremity Assessment: RUE deficits/detail;LUE deficits/detail RUE Deficits / Details: pt reports "frozen shoulder," she has about 30 degrees flexion LUE Deficits / Details: very minimal shoulder AROM   Lower Extremity Assessment Lower Extremity Assessment: Defer to PT evaluation   Cervical / Trunk Assessment Cervical / Trunk Assessment: Kyphotic   Communication Communication Communication: No difficulties   Cognition Arousal/Alertness: Awake/alert Behavior During Therapy: WFL for tasks assessed/performed Overall Cognitive Status: No family/caregiver present to determine baseline cognitive functioning                                 General Comments: seemingly WFL, oriented x4 able to give accurate history.     General Comments  VSS on 3L    Exercises     Shoulder Instructions      Home Living Family/patient expects to be discharged to:: Private residence Living Arrangements: Alone Available Help at Discharge: Family Type of Home: House Home Access: Level entry     Home Layout: One level     Bathroom Shower/Tub: Occupational psychologist: Handicapped height Bathroom Accessibility: Yes   Home Equipment: Conservation officer, nature (2 wheels);Rollator (4 wheels);Cane - single point;Grab bars - tub/shower;Adaptive equipment Adaptive Equipment: Reacher        Prior Functioning/Environment Prior Level of Function : Independent/Modified Independent             Mobility Comments: AD for mobility ADLs Comments: pt reports generally mod I        OT Problem List: Decreased strength;Decreased range of motion;Impaired balance (sitting and/or standing);Decreased activity tolerance;Decreased safety  awareness;Decreased knowledge of use of DME or AE;Decreased knowledge of precautions;Impaired UE functional use      OT Treatment/Interventions: Self-care/ADL training;Therapeutic exercise;DME and/or AE instruction;Energy conservation;Therapeutic activities;Balance training;Patient/family education    OT Goals(Current goals can be found in the care plan section) Acute Rehab OT Goals Patient Stated Goal: to feel better OT Goal Formulation: With patient Time For Goal Achievement: 07/04/22 Potential to Achieve Goals: Good ADL Goals Pt Will Perform Upper Body Dressing: with supervision;with adaptive equipment;sitting Pt Will Perform Lower Body Dressing: with min guard assist;sit to/from stand;with adaptive equipment Pt Will Transfer to Toilet: with modified independence;ambulating Additional ADL Goal #1: Pt will complete at least 3 OOB ADLs to demonstrate increased activity tolerance  OT Frequency: Min 2X/week    Co-evaluation              AM-PAC OT "6 Clicks" Daily Activity     Outcome Measure Help from another person eating meals?: A Little Help from another person taking care of personal grooming?: A Little Help from another person toileting, which includes using toliet, bedpan, or urinal?: A Lot Help from another person bathing (including washing, rinsing, drying)?: A Lot Help from another person to put on and taking off regular upper body clothing?: A Little Help from another person to put on and taking off regular lower body clothing?: A Lot 6 Click Score: 15   End of Session Equipment Utilized During Treatment: Gait belt;Rolling walker (2 wheels);Oxygen Nurse Communication: Mobility status  Activity Tolerance: Patient tolerated treatment well Patient left: in bed;with call bell/phone within reach;with bed alarm set  OT Visit Diagnosis: Unsteadiness on feet (R26.81);Other abnormalities of gait and mobility (R26.89);Muscle weakness (generalized) (M62.81);Pain                 Time: [id number] OT Time Calculation (min): 35 min Charges:  OT General Charges $OT Visit: 1 Visit OT Evaluation $OT Eval Moderate Complexity: 1 Mod OT Treatments $Self Care/Home Management : 8-22 mins  Shade Flood, OTR/L Acute Rehabilitation Services Office [phone] Secure Chat Communication Preferred   Elliot Cousin 06/20/2022, 11:32 AM

## 2022-06-20 NOTE — Assessment & Plan Note (Addendum)
Uncontrolled hypertension, hypertensive emergency.   Recently losartan was decreased to 25 mg from 50 mg   Renal artery [place]: right with 1 to 59% stenosis, with normal size kidney, left with 1-59% stenosis with normal size kidney.   Continue blood pressure control with entresto and carvedilol. Diuresis with furosemide.

## 2022-06-21 ENCOUNTER — Inpatient Hospital Stay (HOSPITAL_COMMUNITY): Payer: Medicare Other

## 2022-06-21 ENCOUNTER — Telehealth: Payer: Self-pay

## 2022-06-21 DIAGNOSIS — I10 Essential (primary) hypertension: Secondary | ICD-10-CM | POA: Diagnosis not present

## 2022-06-21 DIAGNOSIS — I480 Paroxysmal atrial fibrillation: Secondary | ICD-10-CM

## 2022-06-21 DIAGNOSIS — I5033 Acute on chronic diastolic (congestive) heart failure: Secondary | ICD-10-CM | POA: Diagnosis not present

## 2022-06-21 DIAGNOSIS — J449 Chronic obstructive pulmonary disease, unspecified: Secondary | ICD-10-CM

## 2022-06-21 DIAGNOSIS — G4733 Obstructive sleep apnea (adult) (pediatric): Secondary | ICD-10-CM

## 2022-06-21 LAB — BASIC METABOLIC PANEL
Anion gap: 12 (ref 5–15)
BUN: 24 mg/dL — ABNORMAL HIGH (ref 8–23)
CO2: 31 mmol/L (ref 22–32)
Calcium: 8.3 mg/dL — ABNORMAL LOW (ref 8.9–10.3)
Chloride: 95 mmol/L — ABNORMAL LOW (ref 98–111)
Creatinine, Ser: 1.05 mg/dL — ABNORMAL HIGH (ref 0.44–1.00)
GFR, Estimated: 51 mL/min — ABNORMAL LOW (ref >60)
Glucose, Bld: 92 mg/dL (ref 70–99)
Potassium: 3.8 mmol/L (ref 3.5–5.1)
Sodium: 138 mmol/L (ref 135–145)

## 2022-06-21 LAB — MRSA NEXT GEN BY PCR, NASAL: MRSA by PCR Next Gen: NOT DETECTED

## 2022-06-21 LAB — MAGNESIUM: Magnesium: 2.1 mg/dL (ref 1.7–2.4)

## 2022-06-21 MED ORDER — SACUBITRIL-VALSARTAN 24-26 MG PO TABS
1.0000 | ORAL_TABLET | Freq: Two times a day (BID) | ORAL | Status: DC
  Administered 2022-06-21 – 2022-06-23 (×6): 1 via ORAL
  Filled 2022-06-21 (×6): qty 1

## 2022-06-21 MED ORDER — PROSIGHT PO TABS
1.0000 | ORAL_TABLET | Freq: Every day | ORAL | Status: DC
  Administered 2022-06-22 – 2022-06-24 (×3): 1 via ORAL
  Filled 2022-06-21 (×3): qty 1

## 2022-06-21 MED ORDER — CARVEDILOL 3.125 MG PO TABS
3.1250 mg | ORAL_TABLET | Freq: Two times a day (BID) | ORAL | Status: DC
  Administered 2022-06-21 – 2022-06-24 (×6): 3.125 mg via ORAL
  Filled 2022-06-21 (×6): qty 1

## 2022-06-21 MED ORDER — FUROSEMIDE 10 MG/ML IJ SOLN
40.0000 mg | Freq: Two times a day (BID) | INTRAMUSCULAR | Status: DC
  Administered 2022-06-21 – 2022-06-22 (×2): 40 mg via INTRAVENOUS
  Filled 2022-06-21 (×2): qty 4

## 2022-06-21 NOTE — Progress Notes (Signed)
Subjective:  Feels very depressed about the situation that she is back in the hospital.  States that she has been extremely careful with her diet and has maintained weight loss.  Shortness of breath started very fast.  She has not noticed any leg edema.  Intake/Output from previous day:  I/O last 3 completed shifts: In: 360 [P.O.:360] Out: 2200 [Urine:2200] No intake/output data recorded. Net IO Since Admission: -1,840 mL [06/21/22 0926]  Blood pressure (!) 165/79, pulse 68, temperature 97.9 F (36.6 C), resp. rate 16, height '5\' 3"'$  (1.6 m), weight 92.5 kg, SpO2 99 %. Physical Exam Constitutional:      Appearance: She is obese.  Neck:     Vascular: Carotid bruit (bilateral) and JVD present.  Cardiovascular:     Rate and Rhythm: Normal rate and regular rhythm.     Pulses: Intact distal pulses.     Heart sounds: Murmur heard.     Midsystolic murmur is present with a grade of 3/6 at the upper right sternal border radiating to the apex.     No gallop.  Pulmonary:     Effort: Pulmonary effort is normal.     Breath sounds: Examination of the right-lower field reveals rales. Examination of the left-lower field reveals rales. Rales present.  Abdominal:     General: Bowel sounds are normal.     Palpations: Abdomen is soft.  Musculoskeletal:     Right lower leg: No edema.     Left lower leg: No edema.     Lab Results: Lab Results  Component Value Date   NA 138 06/21/2022   K 3.8 06/21/2022   CO2 31 06/21/2022   GLUCOSE 92 06/21/2022   BUN 24 (H) 06/21/2022   CREATININE 1.05 (H) 06/21/2022   CALCIUM 8.3 (L) 06/21/2022   EGFR 52 (L) 04/13/2022   GFRNONAA 51 (L) 06/21/2022    BNP (last 3 results) Recent Labs    04/23/22 0030 04/24/22 0050 06/19/22 1720  BNP 1,056.8* 855.1* 1,428.4*       Latest Ref Rng & Units 06/21/2022    4:24 AM 06/20/2022    2:54 AM 06/19/2022    5:20 PM  BMP  Glucose 70 - 99 mg/dL 92  132  100   BUN 8 - 23 mg/dL '24  17  13   '$ Creatinine 0.44 - 1.00  mg/dL 1.05  1.08  0.83   Sodium 135 - 145 mmol/L 138  139  138   Potassium 3.5 - 5.1 mmol/L 3.8  3.8  4.0   Chloride 98 - 111 mmol/L 95  98  99   CO2 22 - 32 mmol/L 31  34  31   Calcium 8.9 - 10.3 mg/dL 8.3  8.5  8.7       Latest Ref Rng & Units 06/19/2022    5:20 PM 12/23/2019    2:24 PM 12/20/2019    3:20 PM  Hepatic Function  Total Protein 6.5 - 8.1 g/dL 7.6  7.1  6.9   Albumin 3.5 - 5.0 g/dL 3.5  2.9  3.1   AST 15 - 41 U/L 40  39  46   ALT 0 - 44 U/L 35  36  41   Alk Phosphatase 38 - 126 U/L 89  70  65   Total Bilirubin 0.3 - 1.2 mg/dL 0.5  0.5  0.7   Bilirubin, Direct 0.0 - 0.2 mg/dL  0.1  0.2       Latest Ref Rng & Units 06/20/2022  2:54 AM 06/19/2022    5:20 PM 04/24/2022   10:27 AM  CBC  WBC 4.0 - 10.5 K/uL 9.8  9.4  10.2   Hemoglobin 12.0 - 15.0 g/dL 13.5  13.8  13.3   Hematocrit 36.0 - 46.0 % 43.4  44.7  43.3   Platelets 150 - 400 K/uL 236  250  296    Lipid Panel     Component Value Date/Time   CHOL 126 03/14/2019 1121   TRIG 82 03/14/2019 1121   HDL 49 03/14/2019 1121   CHOLHDL 5.1 CALC 12/19/2006 0916   VLDL 26 12/19/2006 0916   LDLCALC 61 03/14/2019 1121   LDLDIRECT 159.2 12/19/2006 0916   Cardiac Panel (last 3 results) No results for input(s): "CKTOTAL", "CKMB", "TROPONINI", "RELINDX" in the last 72 hours.  HEMOGLOBIN A1C No results found for: "HGBA1C", "MPG" TSH Recent Labs    06/19/22 1927  TSH 1.195   Imaging: DG Chest Portable 1 View  06/19/2022 CLINICAL DATA:  Shortness of breath EXAM: PORTABLE CHEST 1 VIEW COMPARISON:  07/27/2021  FINDINGS: This is a low volume study. Cardiomegaly and mild pulmonary vascular congestion noted. Mild bibasilar opacities are present. There is no evidence of pneumothorax or large pleural effusion.  No acute bony abnormalities are identified. Degenerative changes in the shoulders again noted.   IMPRESSION: Cardiomegaly with mild pulmonary vascular congestion and mild bibasilar opacities which may represent  atelectasis or airspace disease/pneumonia.  Cardiac Studies:  Echocardiogram 06/20/2022:  1. Left ventricular ejection fraction, by estimation, is 60 to 65%. The left ventricle has normal function. The left ventricle has no regional wall motion abnormalities. There is moderate concentric left ventricular hypertrophy. Left ventricular  diastolic parameters are consistent with Grade I diastolic dysfunction (impaired relaxation). Elevated left atrial pressure.  2. Right ventricular systolic function is normal. The right ventricular size is normal.  3. Left atrial size was moderately dilated.  4. The mitral valve is normal in structure. No evidence of mitral valve regurgitation. No evidence of mitral stenosis.  5. The aortic valve is severely calcified with restricted leaflet excursion. Visually the valve appears severely stenotic. The aortic valve mean gradient is 46mHg with a peak gradient of 655mg. AVA by VTI of 0.56cm2.  6. The inferior vena cava is dilated in size with >50% respiratory variability, suggesting right atrial pressure of 8 mmHg.  Renal artery duplex  06/21/2022: Right: 1-59% stenosis of the right renal artery. Abnormal right  Resistive Index. Normal size right kidney.  Left:  1-59% stenosis of the left renal artery. Abnormal left Resisitve Index. Normal size of left kidney.  Right kidney length (cm)10.12, left kidney length (cm)10.07    EKG:   EKG 06/19/2022: Normal sinus rhythm at the rate of 54 bpm, normal axis, poor R progression, probably normal variant.  Single PAC.  Scheduled Meds:  amiodarone  100 mg Oral QODAY   apixaban  2.5 mg Oral BID   budesonide  0.5 mg Nebulization BID   busPIRone  7.5 mg Oral QHS   dapagliflozin propanediol  10 mg Oral QAC breakfast   furosemide  40 mg Intravenous Daily   ipratropium-albuterol  3 mL Nebulization Q6H   latanoprost  1 drop Both Eyes QHS   levothyroxine  125 mcg Oral QAC breakfast   losartan  50 mg Oral Daily   melatonin   3 mg Oral QHS   multivitamin  1 tablet Oral Daily   rOPINIRole  1 mg Oral Q1400   rOPINIRole  2 mg Oral  QHS   senna-docusate  2 tablet Oral QHS   Continuous Infusions: PRN Meds:.acetaminophen, albuterol, busPIRone, diclofenac Sodium, HYDROcodone-acetaminophen, ipratropium, neomycin-polymyxin b-dexamethasone, polyethylene glycol, polyvinyl alcohol, prochlorperazine  Assessment  Lisa Mcclure is a 89 y.o. female patient with female with obesity, chronic diastolic CHF, hypertensive heart disease and chronic 3A-B kidney disease, paroxysmal atrial fibrillation, anemia secondary to hemorrhoids and diverticulosis, esophageal reflux, depression, OSA intolerant to CPAP on nocturnal 02, rheumatoid arthritis, and venous insufficiency, aortic and coronary calcification, s/p AAA repair on 12/20/2019   1.  Hypertensive emergency presenting with acute pulm edema 2.  Acute on chronic diastolic heart failure 3.  Paroxysmal atrial fibrillation  Plan:   Patient is enrolled in RPM.  Data appears stable as noted below . Weight is at baseline from post last HF hospitalization (204-206lb). Unable to get patient BP cuff since last OV due to not having BP cuffs.   Weight            lb         --          204.8 (202.2 - 211.4)   06/19/22 6:51 AM 204.2 lb  06/18/22 8:19 AM 208.0 lb  06/18/22 8:18 AM 206.2 lb  06/17/22 7:15 AM 204.8 lb  06/16/22 8:27 AM 204.2 lb  06/14/22 9:29 AM 206.2 lb      I reviewed the renal artery duplex, although 1 to 59% stenosis of bilateral renal arteries, also indicis of renal artery to aorta also <2.0, suggests absence of hemodynamically significant stenosis.  However her presentation was most consistent with renal artery stenosis with acute onset dyspnea and pulm edema and hypertensive urgency.  Her serum creatinine has remained stable, will continue to aggressively treat her hypertension, unless she develops renal insufficiency, then we can consider renal angiography for different  diagnosis.  I will discontinue losartan and try to place her on Entresto for acute diastolic heart failure and also for hypertension.  Unable to increase beta-blocker due to underlying bradycardia, however will try carvedilol 3.125 mg twice daily.  Otherwise on appropriate medical therapy, will continue to follow today and hopefully if she recovers and renal function remained stable, she may be able to be discharged tomorrow.   Adrian Prows, MD, [facility] 06/21/2022, 9:26 AM Office: [phone] Fax: [phone] Pager: [phone]

## 2022-06-21 NOTE — Assessment & Plan Note (Signed)
Continue with inhaled corticosteroids  Positive kyphosis with restrictive physiology. Continue oxymetry monitoring. No clinical signs of exacerbation.

## 2022-06-21 NOTE — Telephone Encounter (Signed)
Weight is at baseline from post last HF hospitalization (204-206lb). Unable to get patient BP cuff since last OV due to not having BP cuffs.  Weight lb -- 204.8 (202.2 - 211.4)  06/19/22 6:51 AM 204.2 lb  06/18/22 8:19 AM 208.0 lb  06/18/22 8:18 AM 206.2 lb  06/17/22 7:15 AM 204.8 lb  06/16/22 8:27 AM 204.2 lb  06/14/22 9:29 AM 206.2 lb

## 2022-06-21 NOTE — TOC Initial Note (Addendum)
Transition of Care [facility]) - Initial/Assessment Note    Patient Details  Name: IVANELL DESHOTEL MRN: [id number] Date of Birth: 01-Dec-1932  Transition of Care [facility]) CM/SW Contact:    Zenon Mayo, RN Phone Number: 06/21/2022, 5:02 PM  Clinical Narrative:                 Patient is from town home alone, she is active with [facility] for [place], she states she would like to continue with [facility]  NCM confirmed with Tommi Rumps with [facility].  She would like to continue with the [place], [place], [place].  Daughter, Thereasa Solo will transport her home.    Expected Discharge Plan: Home/Self Care Barriers to Discharge: Continued Medical Work up   Patient Goals and CMS Choice Patient states their goals for this hospitalization and ongoing recovery are:: return home   Choice offered to / list presented to : NA      Expected Discharge Plan and Services In-house Referral: NA Discharge Planning Services: CM Consult Post Acute Care Choice: Home Health Living arrangements for the past 2 months: Single Family Home                 DME Arranged: N/A DME Agency: NA       HH Arranged: PT,OT,RN [place]: Jal Date [facility] Agency Contacted: 06/21/22 Time HH Agency Contacted: 72 Representative spoke with at [place]: Tommi Rumps  Prior Living Arrangements/Services Living arrangements for the past 2 months: [place] Lives with:: Self Patient language and need for interpreter reviewed:: Yes Do you feel safe going back to the place where you live?: Yes      Need for Family Participation in Patient Care: Yes (Comment) Care giver support system in place?: Yes (comment) Current home services: DME (walker, lift chair, high toilet, grab bars, oxygen at night 2 liters ([employer]), [facility]) Criminal Activity/Legal Involvement Pertinent to Current Situation/Hospitalization: No - Comment as needed  Activities of Daily Living Home Assistive Devices/Equipment: Walker (specify type),  Eyeglasses (4 wheel walker) ADL Screening (condition at time of admission) Patient's cognitive ability adequate to safely complete daily activities?: Yes Is the patient deaf or have difficulty hearing?: No Does the patient have difficulty seeing, even when wearing glasses/contacts?: No Does the patient have difficulty concentrating, remembering, or making decisions?: No Patient able to express need for assistance with ADLs?: Yes Does the patient have difficulty dressing or bathing?: No Independently performs ADLs?: Yes (appropriate for developmental age) Does the patient have difficulty walking or climbing stairs?: Yes Weakness of Legs: Both Weakness of Arms/Hands: None  Permission Sought/Granted                  Emotional Assessment Appearance:: Appears stated age Attitude/Demeanor/Rapport: Engaged Affect (typically observed): Appropriate Orientation: : Oriented to Self, Oriented to Place, Oriented to  Time, Oriented to Situation Alcohol / Substance Use: Not Applicable Psych Involvement: No (comment)  Admission diagnosis:  Acute exacerbation of CHF (congestive heart failure) ([place]) [I50.9] Acute on chronic congestive heart failure, unspecified heart failure type [facility]) [I50.9] Patient Active Problem List   Diagnosis Date Noted   Acute exacerbation of CHF (congestive heart failure) ([place]) 06/19/2022   Acute on chronic diastolic heart failure ([place]) 04/25/2022   Chronic renal failure, stage 3b ([place]) 04/25/2022   Class 2 obesity 04/25/2022   Persistent atrial fibrillation ([place]) 04/22/2022   Chronic cough 03/28/2022   Acute respiratory failure with hypoxia ([place]) 06/15/2021   Paroxysmal atrial fibrillation ([place]) 06/15/2021  Generalized weakness 06/15/2021   Atrial fibrillation with RVR (HCC)    Long term (current) use of anticoagulants    Long term current use of antiarrhythmic drug    AAA (abdominal aortic aneurysm) without rupture ([place]) 12/20/2019   AAA (abdominal aortic  aneurysm) ([place]) 12/20/2019   Intolerance of continuous positive airway pressure (CPAP) ventilation 11/26/2019   Severe hypoxemia 08/21/2019   Severe obstructive sleep apnea 07/29/2019   Chronic obstructive pulmonary disease ([place]) 07/29/2019   RLS (restless legs syndrome) 07/29/2019   Dehydration with hyponatremia 09/13/2018   Bradycardia 09/13/2018   Glaucoma 09/13/2018   OA (osteoarthritis) of shoulder 05/16/2018   Incomplete emptying of bladder 05/17/2017   Labial adhesions 05/17/2017   Mixed stress and urge urinary incontinence 05/17/2017   Urethral sphincter deficiency, intrinsic (ISD) 05/17/2017   Aortic stenosis, mild 01/15/2016   Bilateral carotid bruits 05/11/2015   Hyponatremia 04/19/2015   Symptomatic anemia 04/19/2015   Panic attack 04/19/2015   Constipation 04/19/2015   Hyperkalemia 04/17/2015   Influenza A 04/16/2015   Proximal humerus fracture    Left patella fracture    Benign essential HTN    Esophageal reflux    Chronic heart failure with preserved ejection fraction (HFpEF) ([place])    Hypothyroidism    Lower GI bleed 04/08/2015   Hematochezia    Rectal bleeding 04/03/2015   Patellar fracture 03/06/2015   Left humeral fracture 11/04/2013   Rib fracture 11/04/2013   Hypothyroid 11/04/2013   Hypertension 11/04/2013   Right shoulder pain 11/04/2013   OSA (obstructive sleep apnea) 11/13/2008   BOOP (bronchiolitis obliterans with organizing pneumonia) ([place]) 08/25/2008   PCP:  [name], MD Pharmacy:   [facility] - [place], [place] - [address] [place] [address] Phone: [phone] Fax: [phone]     Social Determinants of Health (SDOH) Social History: SDOH Screenings   Food Insecurity: No Food Insecurity (06/21/2022)  Housing: Low Risk  (06/21/2022)  Transportation Needs: No Transportation Needs (06/21/2022)  Utilities: Not At Risk (06/21/2022)  Alcohol Screen: Low Risk   (08/11/2021)  Depression (PHQ2-9): Medium Risk (08/11/2021)  Financial Resource Strain: Low Risk  (08/11/2021)  Physical Activity: Inactive (08/11/2021)  Social Connections: Moderately Isolated (08/11/2021)  Stress: No Stress Concern Present (08/11/2021)  Tobacco Use: Medium Risk (06/19/2022)   SDOH Interventions:     Readmission Risk Interventions    06/21/2022    4:57 PM 12/25/2019    1:26 PM  Readmission Risk Prevention Plan  Transportation Screening Complete Complete  PCP or Specialist Appt within 3-5 Days Complete Complete  HRI or Home Care Consult Complete Complete  Social Work Consult for [place] Planning/Counseling  Complete  Palliative Care Screening Not Applicable Not Applicable  Medication Review Press photographer) Complete Complete

## 2022-06-21 NOTE — Evaluation (Signed)
Physical Therapy Evaluation Patient Details Name: Lisa Mcclure MRN: [id number] DOB: October 19, 1932 Today's Date: 06/21/2022  History of Present Illness  Lisa Mcclure is a 89 y.o. female who presented from home via EMS with progressive shortness of breath today and new oxygen requirement. Upon arrival  the patient was saturating 83% on RA; chest x-ray revealed mild pulmonary edema. PMHx: chronic diastolic CHF, permanent atrial fibrillation on Eliquis, COPD, OSA on 2 L nasal cannula at night only  Clinical Impression  Pt presents to PT with deficits in endurance, cardiopulmonary function, strength, ROM. Pt reports chronic shoulder ROM and strength deficits which impairs her ability to transfer from lower surfaces due to lack of push. PT provides cues to improve transfer technique and efficiency. Pt continues to desaturate when ambulating on 2L [name] and when without supplemental oxygen. PT recommends frequent ambulation in an effort to improve endurance. PT recommends discharge home with HHPT when medically ready.       Recommendations for follow up therapy are one component of a multi-disciplinary discharge planning process, led by the attending physician.  Recommendations may be updated based on patient status, additional functional criteria and insurance authorization.  Follow Up Recommendations Home health PT      Assistance Recommended at Discharge PRN  Patient can return home with the following  A little help with bathing/dressing/bathroom;Direct supervision/assist for medications management;Assist for transportation    Equipment Recommendations None recommended by PT  Recommendations for Other Services       Functional Status Assessment Patient has had a recent decline in their functional status and demonstrates the ability to make significant improvements in function in a reasonable and predictable amount of time.     Precautions / Restrictions Precautions Precautions:  Fall Precaution Comments: monitor sats Restrictions Weight Bearing Restrictions: No      Mobility  Bed Mobility                    Transfers Overall transfer level: Needs assistance Equipment used: Rollator (4 wheels) Transfers: Sit to/from Stand Sit to Stand: Min guard           General transfer comment: verbal cues for technique    Ambulation/Gait Ambulation/Gait assistance: Supervision Gait Distance (Feet): 300 Feet Assistive device: Rollator (4 wheels) Gait Pattern/deviations: Step-through pattern Gait velocity: functional Gait velocity interpretation: 1.31 - 2.62 ft/sec, indicative of limited community ambulator   General Gait Details: steady step-through Development worker, international aid    Modified Rankin (Stroke Patients Only)       Balance Overall balance assessment: Needs assistance Sitting-balance support: No upper extremity supported, Feet supported Sitting balance-Leahy Scale: Good     Standing balance support: Single extremity supported, Reliant on assistive device for balance Standing balance-Leahy Scale: Fair                               Pertinent Vitals/Pain Pain Assessment Pain Assessment: Faces Faces Pain Scale: Hurts little more Pain Location: shoulders Pain Descriptors / Indicators: Aching Pain Intervention(s): Monitored during session    Home Living Family/patient expects to be discharged to:: Private residence Living Arrangements: Alone Available Help at Discharge: Family Type of Home: House Home Access: Level entry       Home Layout: One level Home Equipment: Conservation officer, nature (2 wheels);Rollator (4 wheels);Cane - single point;Grab bars - tub/shower;Adaptive equipment  Prior Function Prior Level of Function : Independent/Modified Independent             Mobility Comments: ambulatory with rollator, reports increased difficulty recently with transfers due to lack of shoulder  strength ADLs Comments: pt reports generally mod I     Hand Dominance   Dominant Hand: Right    Extremity/Trunk Assessment   Upper Extremity Assessment Upper Extremity Assessment: RUE deficits/detail;LUE deficits/detail RUE Deficits / Details: chronic bilateral shoulder ROM and strength deficits LUE Deficits / Details: chronic bilateral shoulder ROM and strength deficits    Lower Extremity Assessment Lower Extremity Assessment: Generalized weakness    Cervical / Trunk Assessment Cervical / Trunk Assessment: Kyphotic  Communication   Communication: No difficulties  Cognition Arousal/Alertness: Awake/alert Behavior During Therapy: WFL for tasks assessed/performed Overall Cognitive Status: Within Functional Limits for tasks assessed                                          General Comments General comments (skin integrity, edema, etc.): pt on 2L[place] upon arrival, pt desats to 84% when weaned to room air during ambulation. Pt sats 88-89% on 2L [name] when ambulating. Pt denies SOB throughout session    Exercises     Assessment/Plan    PT Assessment Patient needs continued PT services  PT Problem List Decreased activity tolerance;Decreased balance;Decreased mobility;Decreased strength;Decreased range of motion;Cardiopulmonary status limiting activity       PT Treatment Interventions DME instruction;Gait training;Stair training;Functional mobility training;Therapeutic activities;Therapeutic exercise;Balance training;Patient/family education    PT Goals (Current goals can be found in the Care Plan section)  Acute Rehab PT Goals Patient Stated Goal: to go home PT Goal Formulation: With patient Time For Goal Achievement: 07/05/22 Potential to Achieve Goals: Good Additional Goals Additional Goal #1: Pt will report 0/4 DOE when ambulating for >400' to demonstrate improved activity tolerance    Frequency Min 3X/week     Co-evaluation                AM-PAC PT "6 Clicks" Mobility  Outcome Measure Help needed turning from your back to your side while in a flat bed without using bedrails?: A Little Help needed moving from lying on your back to sitting on the side of a flat bed without using bedrails?: A Little Help needed moving to and from a bed to a chair (including a wheelchair)?: A Little Help needed standing up from a chair using your arms (e.g., wheelchair or bedside chair)?: A Little Help needed to walk in hospital room?: A Little Help needed climbing 3-5 steps with a railing? : A Little 6 Click Score: 18    End of Session Equipment Utilized During Treatment: Oxygen Activity Tolerance: Patient tolerated treatment well Patient left: in chair;with call bell/phone within reach;with family/visitor present Nurse Communication: Mobility status PT Visit Diagnosis: Other abnormalities of gait and mobility (R26.89);Muscle weakness (generalized) (M62.81)    Time: [id number] PT Time Calculation (min) (ACUTE ONLY): 32 min   Charges:   PT Evaluation $PT Eval Low Complexity: [place], PT, DPT Acute Rehabilitation Office [phone]   Zenaida Niece 06/21/2022, 5:16 PM

## 2022-06-21 NOTE — ED Notes (Signed)
ED TO INPATIENT HANDOFF REPORT  ED Nurse Name and Phone #: [phone]  S Name/Age/Gender Lisa Mcclure 89 y.o. female Room/Bed: 042C/042C  Code Status   Code Status: DNR  Home/SNF/Other Home Patient oriented to: self, place, time, and situation Is this baseline? Yes   Triage Complete: Triage complete  Chief Complaint Acute exacerbation of CHF (congestive heart failure) ([place]) [I50.9]  Triage Note Pt BIB EMS from home for ShOB, weakness, and fatigue usually able to walk with walker and care for self. Was admitted in January where her medications were adjusted. EMS reports crackles in bases, states that pt desats to the 80's with exertion. Pt normally on 2L only at night for sleep apnea. Today she put herself on 3L during the day due to [facility]. A&Ox4. EMS VS:  CBG 126 HR 58 RR 22 97% 4LNC   Allergies Allergies  Allergen Reactions   Statins Other (See Comments)    Muscle aches and INTERNAL BLEEDING   Atorvastatin Other (See Comments)    Myalgias   Gabapentin Other (See Comments)    "Made me loopy"   Methocarbamol Other (See Comments)    "Made me loopy"    Level of Care/Admitting Diagnosis ED Disposition     ED Disposition  Admit   Condition  --   [place]: [place] [[id number]]  Level of Care: Telemetry Cardiac [[id number]]  May admit patient to [facility] or [facility] if equivalent level of care is available:: No  Covid Evaluation: Asymptomatic - no recent exposure (last 10 days) testing not required  Diagnosis: Acute exacerbation of CHF (congestive heart failure) [facility]) [id number]  Admitting Physician: Kayleen Memos [id number]  Attending Physician: Kayleen Memos [id number]  Certification:: I certify this patient will need inpatient services for at least 2 midnights  Estimated Length of Stay: 2          B Medical/Surgery History Past Medical History:  Diagnosis Date   Acute renal failure superimposed on stage 3b chronic  kidney disease ([place]) 06/15/2021   Anemia    h/o hemorrhoidal bleeding and blood transfusion   Chronic diastolic CHF (congestive heart failure) (HCC)    Depression    Diverticulosis    DOE (dyspnea on exertion)    Esophageal reflux    GERD (gastroesophageal reflux disease)    Hypothyroidism    Insomnia    LBP (low back pain)    OAB (overactive bladder)    Obesity    OSA (obstructive sleep apnea)    Osteoarthritis    Paroxysmal atrial fibrillation (HCC)    Rheumatoid arthritis(714.0)    Shoulder pain, bilateral    Unspecified essential hypertension    Venous insufficiency    Past Surgical History:  Procedure Laterality Date   ABDOMINAL AORTIC ENDOVASCULAR STENT GRAFT N/A 12/20/2019   Procedure: Aortogram including catheter selection of aorta and bilateral iliac arteriogram, Endovascular repair of infrarenal abdominal aortic aneurysm with bifurcated stent graft (26 mm x 14 x 12 main body, right bell bottom with a 20 mm x 10 cm piece, and left bell bottom with a 16 mm x 12 cm piece) ;  Surgeon: Marty Heck, MD;  Location: [facility] OR;  Service: Vascular;  Laterality: N/A;   [place]   BIOPSY  06/18/2021   Procedure: BIOPSY;  Surgeon: Ronnette Juniper, MD;  Location: [facility] ENDOSCOPY;  Service: Gastroenterology;;   bladder abduction-1996  1996   breast biopsy Right 1980   CARDIOVERSION N/A 12/24/2019  Procedure: CARDIOVERSION;  Surgeon: Adrian Prows, MD;  Location: [facility] ENDOSCOPY;  Service: Cardiovascular;  Laterality: N/A;   CARDIOVERSION N/A 04/25/2022   Procedure: CARDIOVERSION;  Surgeon: Adrian Prows, MD;  Location: [facility] ENDOSCOPY;  Service: Cardiovascular;  Laterality: N/A;   [place]     ESOPHAGOGASTRODUODENOSCOPY (EGD) WITH PROPOFOL N/A 06/18/2021   Procedure: ESOPHAGOGASTRODUODENOSCOPY (EGD) WITH PROPOFOL;  Surgeon: Ronnette Juniper, MD;  Location: [facility] ENDOSCOPY;  Service: Gastroenterology;  Laterality: N/A;    FLEXIBLE SIGMOIDOSCOPY N/A 04/10/2015   Procedure: FLEXIBLE SIGMOIDOSCOPY;  Surgeon: Arta Silence, MD;  Location: [facility] ENDOSCOPY;  Service: Endoscopy;  Laterality: N/A;   knee arthroscopy Right 1996, 2010   KNEE ARTHROSCOPY W/ AUTOGENOUS CARTILAGE IMPLANTATION (ACI) PROCEDURE Left 1994, 1995   REFRACTIVE SURGERY  01/2020   SHOULDER SURGERY  1990   TEE WITHOUT CARDIOVERSION N/A 12/24/2019   Procedure: TRANSESOPHAGEAL ECHOCARDIOGRAM (TEE);  Surgeon: Adrian Prows, MD;  Location: [place];  Service: Cardiovascular;  Laterality: N/A;   [place] Bilateral 12/20/2019   Procedure: Ultrasound-guided access of bilateral common femoral arteries for delivery of endograft and percutaneous closure;  Surgeon: Marty Heck, MD;  Location[facility] OR;  Service: Vascular;  Laterality: Bilateral;   VESICOVAGINAL FISTULA CLOSURE W/ [place]     A IV Location/Drains/Wounds Patient Lines/Drains/Airways Status     Active Line/Drains/Airways     Name Placement date Placement time Site Days   Peripheral IV 06/19/22 20 G Anterior;Left Forearm 06/19/22  1721  Forearm  2            Intake/Output Last 24 hours No intake or output data in the 24 hours ending 06/21/22 0959  Labs/Imaging Results for orders placed or performed during the hospital encounter of 06/19/22 (from the past 48 hour(s))  CBC with Differential     Status: None   Collection Time: 06/19/22  5:20 PM  Result Value Ref Range   WBC 9.4 4.0 - 10.5 K/uL   RBC 4.48 3.87 - 5.11 MIL/uL   Hemoglobin 13.8 12.0 - 15.0 g/dL   HCT 44.7 36.0 - 46.0 %   MCV 99.8 80.0 - 100.0 fL   MCH 30.8 26.0 - 34.0 pg   MCHC 30.9 30.0 - 36.0 g/dL   RDW 14.6 11.5 - 15.5 %   Platelets 250 150 - 400 K/uL   nRBC 0.0 0.0 - 0.2 %   Neutrophils Relative % 82 %   Neutro Abs 7.6 1.7 - 7.7 K/uL   Lymphocytes Relative 8 %   Lymphs Abs 0.8 0.7 - 4.0 K/uL   Monocytes Relative 8 %    Monocytes Absolute 0.8 0.1 - 1.0 K/uL   Eosinophils Relative 1 %   Eosinophils Absolute 0.1 0.0 - 0.5 K/uL   Basophils Relative 1 %   Basophils Absolute 0.1 0.0 - 0.1 K/uL   Immature Granulocytes 0 %   Abs Immature Granulocytes 0.04 0.00 - 0.07 K/uL    Comment: Performed at [facility], [address], [place], [place] [address]  Comprehensive metabolic panel     Status: Abnormal   Collection Time: 06/19/22  5:20 PM  Result Value Ref Range   Sodium 138 135 - 145 mmol/L   Potassium 4.0 3.5 - 5.1 mmol/L   Chloride 99 98 - 111 mmol/L   CO2 31 22 -  32 mmol/L   Glucose, Bld 100 (H) 70 - 99 mg/dL    Comment: Glucose reference range applies only to samples taken after fasting for at least 8 hours.   BUN 13 8 - 23 mg/dL   Creatinine, Ser 0.83 0.44 - 1.00 mg/dL   Calcium 8.7 (L) 8.9 - 10.3 mg/dL   Total Protein 7.6 6.5 - 8.1 g/dL   Albumin 3.5 3.5 - 5.0 g/dL   AST 40 15 - 41 U/L   ALT 35 0 - 44 U/L   Alkaline Phosphatase 89 38 - 126 U/L   Total Bilirubin 0.5 0.3 - 1.2 mg/dL   GFR, Estimated >60 >60 mL/min    Comment: (NOTE) Calculated using the CKD-EPI Creatinine Equation (2021)    Anion gap 8 5 - 15    Comment: Performed at [facility] [address], [place], [place] [address]  Troponin I (High Sensitivity)     Status: None   Collection Time: 06/19/22  5:20 PM  Result Value Ref Range   Troponin I (High Sensitivity) 16 <18 ng/L    Comment: (NOTE) Elevated high sensitivity troponin I (hsTnI) values and significant  changes across serial measurements may suggest ACS but many other  chronic and acute conditions are known to elevate hsTnI results.  Refer to the "Links" section for chest pain algorithms and additional  guidance. Performed at [facility], [place] [address], [place], [place] [address]   Brain natriuretic peptide     Status: Abnormal   Collection Time: 06/19/22  5:20 PM  Result Value Ref Range   B Natriuretic Peptide 1,428.4 (H) 0.0 - 100.0 pg/mL     Comment: Performed at [facility] [address], [place], [place] [address]  Troponin I (High Sensitivity)     Status: None   Collection Time: 06/19/22  7:27 PM  Result Value Ref Range   Troponin I (High Sensitivity) 14 <18 ng/L    Comment: (NOTE) Elevated high sensitivity troponin I (hsTnI) values and significant  changes across serial measurements may suggest ACS but many other  chronic and acute conditions are known to elevate hsTnI results.  Refer to the "Links" section for chest pain algorithms and additional  guidance. Performed at [facility], [place] [address] [place], [place] [address]   TSH     Status: None   Collection Time: 06/19/22  7:27 PM  Result Value Ref Range   TSH 1.195 0.350 - 4.500 uIU/mL    Comment: Performed by a 3rd Generation assay with a functional sensitivity of <=0.01 uIU/mL. Performed at [facility], [place] [address], [place], [place] [address]   CBC     Status: None   Collection Time: 06/20/22  2:54 AM  Result Value Ref Range   WBC 9.8 4.0 - 10.5 K/uL   RBC 4.35 3.87 - 5.11 MIL/uL   Hemoglobin 13.5 12.0 - 15.0 g/dL   HCT 43.4 36.0 - 46.0 %   MCV 99.8 80.0 - 100.0 fL   MCH 31.0 26.0 - 34.0 pg   MCHC 31.1 30.0 - 36.0 g/dL   RDW 14.6 11.5 - 15.5 %   Platelets 236 150 - 400 K/uL   nRBC 0.0 0.0 - 0.2 %    Comment: Performed at [facility], [place] [address], [place], [place] [id number]  Basic metabolic panel     Status: Abnormal   Collection Time: 06/20/22  2:54 AM  Result Value Ref Range  Sodium 139 135 - 145 mmol/L   Potassium 3.8 3.5 - 5.1 mmol/L   Chloride 98 98 - 111 mmol/L   CO2 34 (H) 22 - 32 mmol/L   Glucose, Bld 132 (H) 70 - 99 mg/dL    Comment: Glucose reference range applies only to samples taken after fasting for at least 8 hours.   BUN 17 8 - 23 mg/dL   Creatinine, Ser 1.08 (H) 0.44 - 1.00 mg/dL   Calcium 8.5 (L) 8.9 - 10.3 mg/dL   GFR, Estimated 50 (L) >60 mL/min    Comment: (NOTE) Calculated using the  CKD-EPI Creatinine Equation (2021)    Anion gap 7 5 - 15    Comment: Performed at [facility] [address], [place], [place] [address]  Magnesium     Status: None   Collection Time: 06/20/22  2:54 AM  Result Value Ref Range   Magnesium 2.1 1.7 - 2.4 mg/dL    Comment: Performed at [facility] [address], [place], [place] [address]  Phosphorus     Status: Abnormal   Collection Time: 06/20/22  2:54 AM  Result Value Ref Range   Phosphorus 5.1 (H) 2.5 - 4.6 mg/dL    Comment: Performed at [facility] [address] [place], [place] [id number]  Basic metabolic panel     Status: Abnormal   Collection Time: 06/21/22  4:24 AM  Result Value Ref Range   Sodium 138 135 - 145 mmol/L   Potassium 3.8 3.5 - 5.1 mmol/L   Chloride 95 (L) 98 - 111 mmol/L   CO2 31 22 - 32 mmol/L   Glucose, Bld 92 70 - 99 mg/dL    Comment: Glucose reference range applies only to samples taken after fasting for at least 8 hours.   BUN 24 (H) 8 - 23 mg/dL   Creatinine, Ser 1.05 (H) 0.44 - 1.00 mg/dL   Calcium 8.3 (L) 8.9 - 10.3 mg/dL   GFR, Estimated 51 (L) >60 mL/min    Comment: (NOTE) Calculated using the CKD-EPI Creatinine Equation (2021)    Anion gap 12 5 - 15    Comment: Performed at [facility] [address], [place], [place] [address]  Magnesium     Status: None   Collection Time: 06/21/22  4:24 AM  Result Value Ref Range   Magnesium 2.1 1.7 - 2.4 mg/dL    Comment: Performed at [facility] [address], [place], [place] [address]   ECHOCARDIOGRAM COMPLETE  Result Date: 06/20/2022    ECHOCARDIOGRAM REPORT   Patient Name:   Lisa Mcclure Date of Exam: 06/20/2022 Medical Rec #:  [id number]         Height:       63.0 in Accession #:    [id number]        Weight:       204.0 lb Date of Birth:  04-01-1933        BSA:          1.950 m Patient Age:    35 years          BP:           162/63 mmHg Patient Gender: F                 HR:           48 bpm. Exam Location:   Inpatient Procedure: 2D Echo, Cardiac Doppler and Color Doppler Indications:  CHF-Acute Diastolic [id number]  History:        Patient has prior history of Echocardiogram examinations, most                 recent 04/22/2021. CHF, COPD, Arrythmias:Atrial Fibrillation and                 Bradycardia; Risk Factors:Hypertension and Sleep Apnea.  Sonographer:    Ronny Flurry Referring Phys: [id number] [place]  1. Left ventricular ejection fraction, by estimation, is 60 to 65%. The left ventricle has normal function. The left ventricle has no regional wall motion abnormalities. There is moderate concentric left ventricular hypertrophy. Left ventricular diastolic parameters are consistent with Grade I diastolic dysfunction (impaired relaxation). Elevated left atrial pressure.  2. Right ventricular systolic function is normal. The right ventricular size is normal.  3. Left atrial size was moderately dilated.  4. The mitral valve is normal in structure. No evidence of mitral valve regurgitation. No evidence of mitral stenosis.  5. The aortic valve is severely calcified with restricted leaflet excursion. Visually the valve appears severely stenotic. The aortic valve mean gradient is 11mHg with a peak gradient of 644mg. AVA by VTI of 0.56cm2.  6. The inferior vena cava is dilated in size with >50% respiratory variability, suggesting right atrial pressure of 8 mmHg. FINDINGS  Left Ventricle: Left ventricular ejection fraction, by estimation, is 60 to 65%. The left ventricle has normal function. The left ventricle has no regional wall motion abnormalities. The left ventricular internal cavity size was normal in size. There is  moderate concentric left ventricular hypertrophy. Left ventricular diastolic parameters are consistent with Grade I diastolic dysfunction (impaired relaxation). Elevated left atrial pressure. Right Ventricle: The right ventricular size is normal. No increase in right ventricular wall  thickness. Right ventricular systolic function is normal. Left Atrium: Left atrial size was moderately dilated. Right Atrium: Right atrial size was normal in size. Pericardium: There is no evidence of pericardial effusion. Mitral Valve: The mitral valve is normal in structure. Mild mitral annular calcification. No evidence of mitral valve regurgitation. No evidence of mitral valve stenosis. Tricuspid Valve: The tricuspid valve is normal in structure. Tricuspid valve regurgitation is mild . No evidence of tricuspid stenosis. Aortic Valve: The aortic valve is normal in structure. There is severe calcifcation of the aortic valve. There is moderate thickening of the aortic valve. There is mild aortic valve annular calcification. Aortic valve regurgitation is not visualized. Moderate to severe aortic stenosis is present. Aortic valve mean gradient measures 18.0 mmHg. Aortic valve peak gradient measures 30.9 mmHg. Aortic valve area, by VTI measures 0.84 cm. Pulmonic Valve: The pulmonic valve was normal in structure. Pulmonic valve regurgitation is not visualized. No evidence of pulmonic stenosis. Aorta: The aortic root is normal in size and structure. Venous: The inferior vena cava is dilated in size with greater than 50% respiratory variability, suggesting right atrial pressure of 8 mmHg. IAS/Shunts: No atrial level shunt detected by color flow Doppler.  LEFT VENTRICLE PLAX 2D LVIDd:         3.60 cm   Diastology LVIDs:         2.70 cm   LV e' medial:    4.12 cm/s LV PW:         2.40 cm   LV E/e' medial:  25.6 LV IVS:        1.40 cm   LV e' lateral:   5.50 cm/s LVOT diam:     1.80  cm   LV E/e' lateral: 19.2 LV SV:         60 LV SV Index:   31 LVOT Area:     2.54 cm  RIGHT VENTRICLE             IVC RV S prime:     11.20 cm/s  IVC diam: 1.70 cm TAPSE (M-mode): 1.8 cm LEFT ATRIUM             Index        RIGHT ATRIUM           Index LA diam:        4.40 cm 2.26 cm/m   RA Area:     13.55 cm LA Vol (A2C):   77.3 ml 39.64  ml/m  RA Volume:   27.00 ml  13.85 ml/m LA Vol (A4C):   78.5 ml 40.26 ml/m LA Biplane Vol: 79.6 ml 40.82 ml/m  AORTIC VALVE AV Area (Vmax):    0.88 cm AV Area (Vmean):   0.81 cm AV Area (VTI):     0.84 cm AV Vmax:           278.00 cm/s AV Vmean:          194.600 cm/s AV VTI:            0.710 m AV Peak Grad:      30.9 mmHg AV Mean Grad:      18.0 mmHg LVOT Vmax:         95.98 cm/s LVOT Vmean:        62.225 cm/s LVOT VTI:          0.235 m LVOT/AV VTI ratio: 0.33  AORTA Ao Root diam: 3.60 cm Ao Asc diam:  3.40 cm MITRAL VALVE                TRICUSPID VALVE MV Area (PHT): 2.96 cm     TR Peak grad:   30.7 mmHg MV Decel Time: 256 msec     TR Vmax:        277.00 cm/s MV E velocity: 105.50 cm/s MV A velocity: 77.90 cm/s   SHUNTS MV E/A ratio:  1.35         Systemic VTI:  0.24 m                             Systemic Diam: 1.80 cm Aditya Sabharwal Electronically signed by Hebert Soho Signature Date/Time: 06/20/2022/2:13:43 PM    Final    DG Chest Portable 1 View  Result Date: 06/19/2022 CLINICAL DATA:  Shortness of breath EXAM: PORTABLE CHEST 1 VIEW COMPARISON:  07/27/2021 chest radiograph and prior studies FINDINGS: This is a low volume study. Cardiomegaly and mild pulmonary vascular congestion noted. Mild bibasilar opacities are present. There is no evidence of pneumothorax or large pleural effusion. No acute bony abnormalities are identified. Degenerative changes in the shoulders again noted. IMPRESSION: Cardiomegaly with mild pulmonary vascular congestion and mild bibasilar opacities which may represent atelectasis or airspace disease/pneumonia. Electronically Signed   By: Margarette Canada M.D.   On: 06/19/2022 17:44    Pending Labs Unresulted Labs (From admission, onward)     Start     Ordered   06/19/22 2338  VITAMIN D 25 Hydroxy (Vit-D Deficiency, Fractures)  Add-on,   AD        06/19/22 2338            Vitals/Pain Today's Vitals  06/21/22 0600 06/21/22 0757 06/21/22 0800 06/21/22 0856  BP:  (!) 165/79 (!) 165/79    Pulse: (!) 57 68    Resp: (!) 21 16    Temp:    97.9 F (36.6 C)  TempSrc:      SpO2: 100% 96% 99%   Weight:      Height:      PainSc:        Isolation Precautions No active isolations  Medications Medications  apixaban (ELIQUIS) tablet 2.5 mg (2.5 mg Oral Given 06/20/22 2201)  amiodarone (PACERONE) tablet 100 mg (100 mg Oral Given 06/20/22 0921)  budesonide (PULMICORT) nebulizer solution 0.5 mg (0.5 mg Nebulization Given 06/21/22 0757)  melatonin tablet 3 mg (3 mg Oral Given 06/20/22 2200)  polyvinyl alcohol (LIQUIFILM TEARS) 1.4 % ophthalmic solution 1 drop (1 drop Both Eyes Given 06/20/22 0644)  acetaminophen (TYLENOL) tablet 650 mg (has no administration in time range)  prochlorperazine (COMPAZINE) injection 5 mg (has no administration in time range)  polyethylene glycol (MIRALAX / GLYCOLAX) packet 17 g (has no administration in time range)  furosemide (LASIX) injection 40 mg (40 mg Intravenous Given 06/20/22 0921)  busPIRone (BUSPAR) tablet 7.5 mg (7.5 mg Oral Given 06/20/22 2201)  busPIRone (BUSPAR) tablet 7.5 mg (has no administration in time range)  rOPINIRole (REQUIP) tablet 2 mg (2 mg Oral Given 06/20/22 2200)  rOPINIRole (REQUIP) tablet 1 mg (1 mg Oral Given 06/20/22 1330)  albuterol (PROVENTIL) (2.5 MG/3ML) 0.083% nebulizer solution 2.5 mg (has no administration in time range)  diclofenac Sodium (VOLTAREN) 1 % topical gel 2 g (has no administration in time range)  dapagliflozin propanediol (FARXIGA) tablet 10 mg (10 mg Oral Given 06/20/22 0814)  HYDROcodone-acetaminophen (NORCO) 10-325 MG per tablet 1 tablet (1 tablet Oral Given 06/20/22 1330)  ipratropium (ATROVENT) 0.06 % nasal spray 2 spray (has no administration in time range)  latanoprost (XALATAN) 0.005 % ophthalmic solution 1 drop (1 drop Both Eyes Given 06/20/22 2207)  levothyroxine (SYNTHROID) tablet 125 mcg (125 mcg Oral Given 06/21/22 0549)  multivitamin (PROSIGHT) tablet 1 tablet (1 tablet Oral  Given 06/20/22 1052)  neomycin-polymyxin b-dexamethasone (MAXITROL) ophthalmic ointment 1 Application (1 Application Both Eyes Given 06/20/22 0111)  senna-docusate (Senokot-S) tablet 2 tablet (2 tablets Oral Given 06/20/22 2201)  ipratropium-albuterol (DUONEB) 0.5-2.5 (3) MG/3ML nebulizer solution 3 mL (3 mLs Nebulization Given 06/21/22 0756)  losartan (COZAAR) tablet 50 mg (has no administration in time range)  ipratropium-albuterol (DUONEB) 0.5-2.5 (3) MG/3ML nebulizer solution 3 mL (3 mLs Nebulization Given 06/19/22 1728)  furosemide (LASIX) injection 40 mg (40 mg Intravenous Given 06/19/22 1928)  losartan (COZAAR) tablet 25 mg (25 mg Oral Given 06/20/22 1728)    Mobility walks with device     Focused Assessments Pulmonary Assessment Handoff:  Lung sounds: Bilateral Breath Sounds: Diminished, Clear O2 Device: Nasal Cannula O2 Flow Rate (L/min): 4 L/min    R Recommendations: See Admitting Provider Note  Report given to:   Additional Notes: Pt use 2 Liter of oxygen during the night at home. Uses a walk with assist now

## 2022-06-21 NOTE — Progress Notes (Addendum)
Progress Note   Patient: Lisa Mcclure [id number] DOB: [birth year] DOA: 06/19/2022     2 DOS: the patient was seen and examined on 06/21/2022   Brief hospital course: Lisa Mcclure was admitted to the hospital with the working diagnosis of heart failure exacerbation.   89 yo female with the past medical history of heart failure, atrial fibrillation, COPD, and OSA who presented with dyspnea. Recent outpatient decreased dose of losartan. Patient had a rapid onset of dyspnea and generalized weakness. Severe symptoms that prompted her daughter to call EMS. She was found hypoxemic with 02 saturation 83% on room air, she was placed on supplemental 02 per [place] 3 L.min and was transported to the ED. On her initial physical examination in the emergency room her blood pressure was 225/90, 171/53, HR 81, RR 16 and 02 saturation 93% on 3 L/min per [place], lungs with diffuse rales and wheezing bilaterally, with no rhonchi, heart with S1 and S2 present and regular, abdomen with no distention, positive lower extremity edema.   Na 138, K 4,0 Cl 99, bicarbonate 31 glucose 100, bun 13 cr 0,83  BNP 1,428  High sensitive troponin 16 and 14  Wbc 9,4 hgb 13,8 plt 250   Chest radiograph with mid hyperinflation, with bilateral hilar vascular congestion and bilateral atelectasis.  EKG 54 bpm, normal axis, normal intervals, sinus rhythm with PAC, poor R R wave progression with no significant ST segment or T wave changes.   Patient has been placed on IV furosemide with improvement in her symptoms.  Renal artery duplex US with no significant renal artery stenosis.    Assessment and Plan: * Acute on chronic diastolic heart failure (HCC) Echocardiogram with preserved LV systolic function with EF 60 to 65%, moderate LVH, preserved RV systolic function, moderate LA dilatation, moderate to severe aortic stenosis.   Urine output not documented. Systolic blood pressure is 150 to 160 mmHg.   Transitioned to entresto,  continue with dapagliflozin. Added carvedilol.  If blood pressure and renal function stable, consider adding spironolactone.  Continue diuresis with furosemide.    Hypertension Uncontrolled hypertension, hypertensive emergency.   Recently losartan was decreased to 25 mg from 50 mg   Renal artery [place]: right with 1 to 59% stenosis, with normal size kidney, left with 1-59% stenosis with normal size kidney.   Continue blood pressure control with entresto and carvedilol. Diuresis with furosemide.   Paroxysmal atrial fibrillation (HCC) Continue rate control with amiodarone and carvedilol.  Anticoagulation with apixaban.  Continue telemetry monitoring.   Chronic obstructive pulmonary disease (HCC) Continue with inhaled corticosteroids  Positive kyphosis with restrictive physiology. Continue oxymetry monitoring. No clinical signs of exacerbation.   OSA (obstructive sleep apnea) Cpap  Class 2 obesity Calculated BMI is 36,1   Restless leg syndrome, continue with ropinarole. Depression and anxiety, continue with buspirone.    Hypothyroidism Continue levothyroxine         Subjective: Patient with no chest pain, dyspnea is improving,   Physical Exam: Vitals:   06/21/22 0856 06/21/22 1027 06/21/22 1030 06/21/22 1156  BP:  (!) 157/70  (!) 160/74  Pulse:  63  70  Resp:  16  18  Temp: 97.9 F (36.6 C) 97.9 F (36.6 C)  97.6 F (36.4 C)  TempSrc:  Oral  Oral  SpO2:  99% 99% 97%  Weight:    90.5 kg  Height:    5' 3.5" (1.613 m)   Neurology awake and alert ENT with mild pallor Cardiovascular with S1 and  S2 present and rhythmic with no gallops, or rubs, positive murmur at the base, systolic No JVD No lower extremity edema Respiratory with distant breath sounds and prolonged expiratory phase, scattered rales with no wheezing or rhonchi Abdomen with no distention  Data Reviewed:    Family Communication: no family at the bedside   Disposition: Status is:  Inpatient Remains inpatient appropriate because: diuresis IV and blood pressure monitoring   Planned Discharge Destination: Home      Author: Tawni Millers, MD 06/21/2022 2:30 PM  For on call review [email or web address].

## 2022-06-21 NOTE — Progress Notes (Signed)
PROGRESS NOTE    KAIYANA HOLTAN  [id number] DOB: September 10, 1932 DOA: 06/19/2022 PCP: [name], MD  [id number] w/ diastolic CHF , atrial fibrillation, COPD, and OSA who presented with dyspnea.  -found hypoxemic with 02 saturation 83% on room air,  In ED, cr 0,83, BNP 1,428 ,  troponin 16 and 14 , CXR with mid hyperinflation, with bilateral hilar vascular congestion and bilateral atelectasis. -Echo noted preserved EF, moderate to severe aortic stenosis  Subjective: Feels better overall, breathing improving   Assessment and Plan:   Acute on chronic diastolic heart failure (HCC)  Mod to Severe AS Echo with EF 60 to 65%, moderate LVH, preserved RV systolic function, moderate LA dilatation, moderate to severe aortic stenosis.  -diuresed with IV lasix, 3.1 L negative, weight down 5 LB -Appears close to euvolemic, changed to p.o. Lasix, cards following -Continue [name] -BMP in a.m. -Per cards will evaluate aortic stenosis further with TEE as outpatient  Hypertension -Improved, meds as above  Paroxysmal atrial fibrillation (HCC) Continue amiodarone and carvedilol.  Anticoagulation with apixaban.   Chronic obstructive pulmonary disease (HCC) Continue with inhaled corticosteroids  Positive kyphosis with restrictive physiology. -Add DuoNebs  OSA (obstructive sleep apnea) Cpap  Class 2 obesity Calculated BMI is 36,1   Restless leg syndrome, continue  ropinarole.  Depression and anxiety, continue buspirone.    Hypothyroidism Continue levothyroxine    DVT prophylaxis: apixaban Code Status: DNR Family Communication: daughter at bedside Disposition Plan: home tomorrow  Consultants: CArds   Procedures:   Antimicrobials:    Objective: Vitals:   06/22/22 0407 06/22/22 0735 06/22/22 0818 06/22/22 0819  BP: (!) 145/47 (!) 145/74    Pulse: (!) 57 62    Resp: 19 18    Temp: 97.8 F (36.6 C) 97.8 F (36.6 C)    TempSrc: Oral Oral    SpO2: 96% 99% 96%  96%  Weight:      Height:        Intake/Output Summary (Last 24 hours) at 06/22/2022 1131 Last data filed at 06/22/2022 0408 Gross per 24 hour  Intake 500 ml  Output 1850 ml  Net -1350 ml   Filed Weights   06/19/22 1718 06/21/22 1156 06/21/22 2343  Weight: 92.5 kg 90.5 kg 90.6 kg    Examination:  Gen: Awake, Alert, Oriented X 3,  HEENT: no JVD Lungs: few Rhonchi, decreased breath sounds bases CVS: [id number], systolic murmur Abd: soft, Non tender, non distended, BS present Extremities: Trace edema Skin: no new rashes on exposed skin    Data Reviewed:   CBC: Recent Labs  Lab 06/19/22 1720 06/20/22 0254  WBC 9.4 9.8  NEUTROABS 7.6  --   HGB 13.8 13.5  HCT 44.7 43.4  MCV 99.8 99.8  PLT 250 [id number]   Basic Metabolic Panel: Recent Labs  Lab 06/19/22 1720 06/20/22 0254 06/21/22 0424 06/22/22 0032  NA 138 139 138 136  K 4.0 3.8 3.8 3.6  CL 99 98 95* 94*  CO2 31 34* 31 34*  GLUCOSE 100* 132* 92 112*  BUN 13 17 24* 21  CREATININE 0.83 1.08* 1.05* 1.11*  CALCIUM 8.7* 8.5* 8.3* 8.4*  MG  --  2.1 2.1  --   PHOS  --  5.1*  --   --    GFR: Estimated Creatinine Clearance: 38.6 mL/min (A) (by C-G formula based on SCr of 1.11 mg/dL (H)). Liver Function Tests: Recent Labs  Lab 06/19/22 1720  AST 40  ALT 35  ALKPHOS 89  BILITOT 0.5  PROT 7.6  ALBUMIN 3.5   No results for input(s): "LIPASE", "AMYLASE" in the last 168 hours. No results for input(s): "AMMONIA" in the last 168 hours. Coagulation Profile: No results for input(s): "INR", "PROTIME" in the last 168 hours. Cardiac Enzymes: No results for input(s): "CKTOTAL", "CKMB", "CKMBINDEX", "TROPONINI" in the last 168 hours. BNP (last 3 results) No results for input(s): "PROBNP" in the last 8760 hours. HbA1C: No results for input(s): "HGBA1C" in the last 72 hours. CBG: No results for input(s): "GLUCAP" in the last 168 hours. Lipid Profile: No results for input(s): "CHOL", "HDL", "LDLCALC", "TRIG", "CHOLHDL",  "LDLDIRECT" in the last 72 hours. Thyroid Function Tests: Recent Labs    06/19/22 1927  TSH 1.195   Anemia Panel: No results for input(s): "VITAMINB12", "FOLATE", "FERRITIN", "TIBC", "IRON", "RETICCTPCT" in the last 72 hours. Urine analysis:    Component Value Date/Time   COLORURINE STRAW (A) 09/13/2018 1740   APPEARANCEUR CLEAR 09/13/2018 1740   LABSPEC 1.008 09/13/2018 1740   PHURINE 7.0 09/13/2018 1740   GLUCOSEU NEGATIVE 09/13/2018 1740   HGBUR NEGATIVE 09/13/2018 1740   BILIRUBINUR NEGATIVE 09/13/2018 1740   KETONESUR NEGATIVE 09/13/2018 1740   PROTEINUR NEGATIVE 09/13/2018 1740   UROBILINOGEN 0.2 01/06/2009 0808   NITRITE NEGATIVE 09/13/2018 1740   LEUKOCYTESUR NEGATIVE 09/13/2018 1740   Sepsis Labs: '@LABRCNTIP'$ (procalcitonin:4,lacticidven:4)  ) Recent Results (from the past 240 hour(s))  MRSA Next Gen by PCR, Nasal     Status: None   Collection Time: 06/21/22  1:39 PM   Specimen: Nasal Mucosa; Nasal Swab  Result Value Ref Range Status   MRSA by PCR Next Gen NOT DETECTED NOT DETECTED Final    Comment: (NOTE) The GeneXpert MRSA Assay (FDA approved for NASAL specimens only), is one component of a comprehensive MRSA colonization surveillance program. It is not intended to diagnose MRSA infection nor to guide or monitor treatment for MRSA infections. Test performance is not FDA approved in patients less than 9 years old. Performed at [facility], [place] [address], [place], [place] [address]      Radiology Studies: VAS US RENAL ARTERY DUPLEX  Result Date: 06/21/2022 ABDOMINAL VISCERAL Patient Name:  VALISSA IMONDI  Date of Exam:   06/21/2022 Medical Rec #: [id number]          Accession #:    [id number] Date of Birth: Jan 05, 1933         Patient Gender: F Patient Age:   89 years Exam Location:  [facility] Procedure:      VAS US RENAL ARTERY DUPLEX Referring Phys: [id number] [place]  -------------------------------------------------------------------------------- Indications: Hypertension High Risk Factors: Hypertension, coronary artery disease. Other Factors: GERD, Chronic kidney disease. Vascular Interventions: 12/20/19 - Endovascular repair of infrarenal abdominal                         aortic aneurysm. Limitations: Obesity. Performing Technologist: Oda Cogan RDMS, RVT  Examination Guidelines: A complete evaluation includes B-mode imaging, spectral Doppler, color Doppler, and power Doppler as needed of all accessible portions of each vessel. Bilateral testing is considered an integral part of a complete examination. Limited examinations for reoccurring indications may be performed as noted.  Duplex Findings: +----------------------+--------+--------+------+----------------+ Mesenteric            PSV cm/sEDV cm/sPlaque    Comments     +----------------------+--------+--------+------+----------------+ Aorta Mid                89  Patent endograft +----------------------+--------+--------+------+----------------+ Celiac Artery Proximal                       not visualized  +----------------------+--------+--------+------+----------------+ SMA Proximal                                 not visualized  +----------------------+--------+--------+------+----------------+   +------------------+--------+--------+-------+ Right Renal ArteryPSV cm/sEDV cm/sComment +------------------+--------+--------+-------+ Origin              159      24           +------------------+--------+--------+-------+ Proximal            168      27           +------------------+--------+--------+-------+ Mid                 131      22           +------------------+--------+--------+-------+ Distal               80      14           +------------------+--------+--------+-------+ +-----------------+--------+--------+-------+ Left Renal ArteryPSV cm/sEDV  cm/sComment +-----------------+--------+--------+-------+ Origin             116      22           +-----------------+--------+--------+-------+ Proximal           148      22           +-----------------+--------+--------+-------+ Mid                115      20           +-----------------+--------+--------+-------+ Distal              66      14           +-----------------+--------+--------+-------+ +------------+--------+--------+----+-----------+--------+--------+----+ Right KidneyPSV cm/sEDV cm/sRI  Left KidneyPSV cm/sEDV cm/sRI   +------------+--------+--------+----+-----------+--------+--------+----+ Upper Pole  38      9       0.75Upper Pole 42      8       0.80 +------------+--------+--------+----+-----------+--------+--------+----+ Mid         67      11      0.83Mid        27      8       0.71 +------------+--------+--------+----+-----------+--------+--------+----+ Lower Pole  42      9       0.79Lower Pole 34      9       0.75 +------------+--------+--------+----+-----------+--------+--------+----+ Hilar       32      7       0.77Hilar      34      11      0.69 +------------+--------+--------+----+-----------+--------+--------+----+ +------------------+-----+------------------+-----+ Right Kidney           Left Kidney             +------------------+-----+------------------+-----+ RAR                    RAR                     +------------------+-----+------------------+-----+ RAR (manual)      1.8  RAR (manual)      1.6   +------------------+-----+------------------+-----+ Cortex  Cortex                  +------------------+-----+------------------+-----+ Cortex thickness       Corex thickness         +------------------+-----+------------------+-----+ Kidney length (cm)10.12Kidney length (cm)10.07 +------------------+-----+------------------+-----+  Summary: Renal:  Right: 1-59% stenosis of  the right renal artery. Abnormal right        Resistive Index. Normal size right kidney. Left:  1-59% stenosis of the left renal artery. Abnormal left        Resisitve Index. Normal size of left kidney.  *See table(s) above for measurements and observations.  Diagnosing physician: Servando Snare MD  Electronically signed by Servando Snare MD on 06/21/2022 at 4:51:48 PM.    Final    ECHOCARDIOGRAM COMPLETE  Result Date: 06/20/2022    ECHOCARDIOGRAM REPORT   Patient Name:   CLOIS COLA Date of Exam: 06/20/2022 Medical Rec #:  [id number]         Height:       63.0 in Accession #:    [id number]        Weight:       204.0 lb Date of Birth:  01-12-1933        BSA:          1.950 m Patient Age:    8 years          BP:           162/63 mmHg Patient Gender: F                 HR:           48 bpm. Exam Location:  Inpatient Procedure: 2D Echo, Cardiac Doppler and Color Doppler Indications:    CHF-Acute Diastolic [id number]  History:        Patient has prior history of Echocardiogram examinations, most                 recent 04/22/2021. CHF, COPD, Arrythmias:Atrial Fibrillation and                 Bradycardia; Risk Factors:Hypertension and Sleep Apnea.  Sonographer:    Ronny Flurry Referring Phys: [id number] [place]  1. Left ventricular ejection fraction, by estimation, is 60 to 65%. The left ventricle has normal function. The left ventricle has no regional wall motion abnormalities. There is moderate concentric left ventricular hypertrophy. Left ventricular diastolic parameters are consistent with Grade I diastolic dysfunction (impaired relaxation). Elevated left atrial pressure.  2. Right ventricular systolic function is normal. The right ventricular size is normal.  3. Left atrial size was moderately dilated.  4. The mitral valve is normal in structure. No evidence of mitral valve regurgitation. No evidence of mitral stenosis.  5. The aortic valve is severely calcified with restricted leaflet excursion.  Visually the valve appears severely stenotic. The aortic valve mean gradient is 41mHg with a peak gradient of 62mg. AVA by VTI of 0.56cm2.  6. The inferior vena cava is dilated in size with >50% respiratory variability, suggesting right atrial pressure of 8 mmHg. FINDINGS  Left Ventricle: Left ventricular ejection fraction, by estimation, is 60 to 65%. The left ventricle has normal function. The left ventricle has no regional wall motion abnormalities. The left ventricular internal cavity size was normal in size. There is  moderate concentric left ventricular hypertrophy. Left ventricular diastolic parameters are consistent with Grade I diastolic dysfunction (impaired relaxation). Elevated left atrial pressure. Right Ventricle: The right ventricular size is  normal. No increase in right ventricular wall thickness. Right ventricular systolic function is normal. Left At[facility]: Left atrial size was moderately dilated. Right At[facility]: Right atrial size was normal in size. Pericardium: There is no evidence of pericardial effusion. Mitral Valve: The mitral valve is normal in structure. Mild mitral annular calcification. No evidence of mitral valve regurgitation. No evidence of mitral valve stenosis. Tricuspid Valve: The tricuspid valve is normal in structure. Tricuspid valve regurgitation is mild . No evidence of tricuspid stenosis. Aortic Valve: The aortic valve is normal in structure. There is severe calcifcation of the aortic valve. There is moderate thickening of the aortic valve. There is mild aortic valve annular calcification. Aortic valve regurgitation is not visualized. Moderate to severe aortic stenosis is present. Aortic valve mean gradient measures 18.0 mmHg. Aortic valve peak gradient measures 30.9 mmHg. Aortic valve area, by VTI measures 0.84 cm. Pulmonic Valve: The pulmonic valve was normal in structure. Pulmonic valve regurgitation is not visualized. No evidence of pulmonic stenosis. Aorta: The aortic root  is normal in size and structure. Venous: The inferior vena cava is dilated in size with greater than 50% respiratory variability, suggesting right atrial pressure of 8 mmHg. IAS/Shunts: No atrial level shunt detected by color flow Doppler.  LEFT VENTRICLE PLAX 2D LVIDd:         3.60 cm   Diastology LVIDs:         2.70 cm   LV e' medial:    4.12 cm/s LV PW:         2.40 cm   LV E/e' medial:  25.6 LV IVS:        1.40 cm   LV e' lateral:   5.50 cm/s LVOT diam:     1.80 cm   LV E/e' lateral: 19.2 LV SV:         60 LV SV Index:   31 LVOT Area:     2.54 cm  RIGHT VENTRICLE             IVC RV S prime:     11.20 cm/s  IVC diam: 1.70 cm TAPSE (M-mode): 1.8 cm LEFT ATRIUM             Index        RIGHT ATRIUM           Index LA diam:        4.40 cm 2.26 cm/m   RA Area:     13.55 cm LA Vol (A2C):   77.3 ml 39.64 ml/m  RA Volume:   27.00 ml  13.85 ml/m LA Vol (A4C):   78.5 ml 40.26 ml/m LA Biplane Vol: 79.6 ml 40.82 ml/m  AORTIC VALVE AV Area (Vmax):    0.88 cm AV Area (Vmean):   0.81 cm AV Area (VTI):     0.84 cm AV Vmax:           278.00 cm/s AV Vmean:          194.600 cm/s AV VTI:            0.710 m AV Peak Grad:      30.9 mmHg AV Mean Grad:      18.0 mmHg LVOT Vmax:         95.98 cm/s LVOT Vmean:        62.225 cm/s LVOT VTI:          0.235 m LVOT/AV VTI ratio: 0.33  AORTA Ao Root diam: 3.60 cm Ao Asc diam:  3.40  cm MITRAL VALVE                TRICUSPID VALVE MV Area (PHT): 2.96 cm     TR Peak grad:   30.7 mmHg MV Decel Time: 256 msec     TR Vmax:        277.00 cm/s MV E velocity: 105.50 cm/s MV A velocity: 77.90 cm/s   SHUNTS MV E/A ratio:  1.35         Systemic VTI:  0.24 m                             Systemic Diam: 1.80 cm Aditya Sabharwal Electronically signed by Hebert Soho Signature Date/Time: 06/20/2022/2:13:43 PM    Final      Scheduled Meds:  amiodarone  100 mg Oral QODAY   apixaban  2.5 mg Oral BID   budesonide  0.5 mg Nebulization BID   busPIRone  7.5 mg Oral QHS   carvedilol  3.125 mg Oral  BID WC   dapagliflozin propanediol  10 mg Oral QAC breakfast   [START ON 06/23/2022] furosemide  40 mg Oral Daily   ipratropium-albuterol  3 mL Nebulization Q6H   latanoprost  1 drop Both Eyes QHS   levothyroxine  125 mcg Oral QAC breakfast   melatonin  3 mg Oral QHS   multivitamin  1 tablet Oral Daily   rOPINIRole  1 mg Oral Q1400   rOPINIRole  2 mg Oral QHS   sacubitril-valsartan  1 tablet Oral BID   senna-docusate  2 tablet Oral QHS   Continuous Infusions:   LOS: 3 days    Time spent: 33mn    PDomenic Polite MD [facility]   06/22/2022, 11:31 AM

## 2022-06-22 ENCOUNTER — Telehealth: Payer: Self-pay

## 2022-06-22 DIAGNOSIS — I5033 Acute on chronic diastolic (congestive) heart failure: Secondary | ICD-10-CM | POA: Diagnosis not present

## 2022-06-22 LAB — VITAMIN D 25 HYDROXY (VIT D DEFICIENCY, FRACTURES): Vit D, 25-Hydroxy: 43.41 ng/mL (ref 30–100)

## 2022-06-22 LAB — BASIC METABOLIC PANEL
Anion gap: 8 (ref 5–15)
BUN: 21 mg/dL (ref 8–23)
CO2: 34 mmol/L — ABNORMAL HIGH (ref 22–32)
Calcium: 8.4 mg/dL — ABNORMAL LOW (ref 8.9–10.3)
Chloride: 94 mmol/L — ABNORMAL LOW (ref 98–111)
Creatinine, Ser: 1.11 mg/dL — ABNORMAL HIGH (ref 0.44–1.00)
GFR, Estimated: 48 mL/min — ABNORMAL LOW (ref >60)
Glucose, Bld: 112 mg/dL — ABNORMAL HIGH (ref 70–99)
Potassium: 3.6 mmol/L (ref 3.5–5.1)
Sodium: 136 mmol/L (ref 135–145)

## 2022-06-22 MED ORDER — GUAIFENESIN ER 600 MG PO TB12
600.0000 mg | ORAL_TABLET | Freq: Two times a day (BID) | ORAL | Status: DC
  Administered 2022-06-22 – 2022-06-24 (×5): 600 mg via ORAL
  Filled 2022-06-22 (×5): qty 1

## 2022-06-22 MED ORDER — FUROSEMIDE 40 MG PO TABS
40.0000 mg | ORAL_TABLET | Freq: Every day | ORAL | Status: DC
  Administered 2022-06-23: 40 mg via ORAL
  Filled 2022-06-22 (×2): qty 1

## 2022-06-22 MED ORDER — GUAIFENESIN-DM 100-10 MG/5ML PO SYRP
5.0000 mL | ORAL_SOLUTION | ORAL | Status: DC | PRN
  Administered 2022-06-22 – 2022-06-24 (×2): 5 mL via ORAL
  Filled 2022-06-22 (×2): qty 5

## 2022-06-22 MED ORDER — NYSTATIN 100000 UNIT/GM EX POWD
Freq: Two times a day (BID) | CUTANEOUS | Status: DC
  Filled 2022-06-22: qty 15

## 2022-06-22 NOTE — Telephone Encounter (Signed)
Patient called and stated that she has been coughing so much that her head hurts and she feels chest tightness and is starting to get SOB. She wants to know if you can stop by the hospital to see her this afternoon or when you go back to the hospital at all.   Shortly before finishing this message, the patient's daughter called back and says that the patient has made all these complaints long after you spoke with her and they really need you to

## 2022-06-22 NOTE — Care Management Important Message (Signed)
Important Message  Patient Details  Name: Lisa Mcclure MRN: [id number] Date of Birth: [birth year]   Medicare Important Message Given:  Yes     Shelda Altes 06/22/2022, 8:35 AM

## 2022-06-22 NOTE — Progress Notes (Signed)
Mobility Specialist - Progress Note   06/22/22 0900  Mobility  Activity Ambulated with assistance in hallway  Level of Assistance Standby assist, set-up cues, supervision of patient - no hands on  Assistive Device Four wheel walker  Distance Ambulated (ft) 400 ft  Activity Response Tolerated well  Mobility Referral Yes  $Mobility charge 1 Mobility    Pt received in recliner and agreeable. No complaints on walk, no physical assistance needed throughout. Left at sink to wash up EOS w/ all needs met.   [place] Specialist Please contact via SecureChat or Rehab office at [phone]

## 2022-06-22 NOTE — Progress Notes (Signed)
Subjective:  Patient has decreased close 2 5 to 6 L of fluid in the last 36 hours.  Feels remarkably well, still has persistent wheezing.  Daughter is present.  Intake/Output from previous day:  I/O last 3 completed shifts: In: 500 [P.O.:500] Out: 1850 [Urine:1850] No intake/output data recorded. Net IO Since Admission: -3,190 mL [06/22/22 0814]  Blood pressure (!) 145/47, pulse (!) 57, temperature 97.8 F (36.6 C), temperature source Oral, resp. rate 19, height 5' 3.5" (1.613 m), weight 90.6 kg, SpO2 96 %. Physical Exam Constitutional:      Appearance: She is obese.  Neck:     Vascular: Carotid bruit (bilateral) and JVD present.  Cardiovascular:     Rate and Rhythm: Normal rate and regular rhythm.     Pulses: Intact distal pulses.     Heart sounds: S1 normal and S2 normal. Murmur heard.     Midsystolic murmur is present with a grade of 3/6 at the upper right sternal border radiating to the apex.     No gallop.  Pulmonary:     Effort: Pulmonary effort is normal.     Breath sounds: Wheezing (diffuse) present.  Abdominal:     General: Bowel sounds are normal.     Palpations: Abdomen is soft.  Musculoskeletal:     Right lower leg: No edema.     Left lower leg: No edema.     Lab Results: Lab Results  Component Value Date   NA 136 06/22/2022   K 3.6 06/22/2022   CO2 34 (H) 06/22/2022   GLUCOSE 112 (H) 06/22/2022   BUN 21 06/22/2022   CREATININE 1.11 (H) 06/22/2022   CALCIUM 8.4 (L) 06/22/2022   EGFR 52 (L) 04/13/2022   GFRNONAA 48 (L) 06/22/2022    BNP (last 3 results) Recent Labs    04/23/22 0030 04/24/22 0050 06/19/22 1720  BNP 1,056.8* 855.1* 1,428.4*        Latest Ref Rng & Units 06/22/2022   12:32 AM 06/21/2022    4:24 AM 06/20/2022    2:54 AM  BMP  Glucose 70 - 99 mg/dL 112  92  132   BUN 8 - 23 mg/dL '21  24  17   '$ Creatinine 0.44 - 1.00 mg/dL 1.11  1.05  1.08   Sodium 135 - 145 mmol/L 136  138  139   Potassium 3.5 - 5.1 mmol/L 3.6  3.8  3.8    Chloride 98 - 111 mmol/L 94  95  98   CO2 22 - 32 mmol/L 34  31  34   Calcium 8.9 - 10.3 mg/dL 8.4  8.3  8.5       Latest Ref Rng & Units 06/19/2022    5:20 PM 12/23/2019    2:24 PM 12/20/2019    3:20 PM  Hepatic Function  Total Protein 6.5 - 8.1 g/dL 7.6  7.1  6.9   Albumin 3.5 - 5.0 g/dL 3.5  2.9  3.1   AST 15 - 41 U/L 40  39  46   ALT 0 - 44 U/L 35  36  41   Alk Phosphatase 38 - 126 U/L 89  70  65   Total Bilirubin 0.3 - 1.2 mg/dL 0.5  0.5  0.7   Bilirubin, Direct 0.0 - 0.2 mg/dL  0.1  0.2       Latest Ref Rng & Units 06/20/2022    2:54 AM 06/19/2022    5:20 PM 04/24/2022   10:27 AM  CBC  WBC  4.0 - 10.5 K/uL 9.8  9.4  10.2   Hemoglobin 12.0 - 15.0 g/dL 13.5  13.8  13.3   Hematocrit 36.0 - 46.0 % 43.4  44.7  43.3   Platelets 150 - 400 K/uL 236  250  296    Lab Results  Component Value Date   CHOL 126 03/14/2019   HDL 49 03/14/2019   LDLCALC 61 03/14/2019   LDLDIRECT 159.2 12/19/2006   TRIG 82 03/14/2019   CHOLHDL 5.1 CALC 12/19/2006    TSH Recent Labs    06/19/22 1927  TSH 1.195    Imaging: DG Chest Portable 1 View  06/19/2022 CLINICAL DATA:  Shortness of breath EXAM: PORTABLE CHEST 1 VIEW COMPARISON:  07/27/2021  FINDINGS: This is a low volume study. Cardiomegaly and mild pulmonary vascular congestion noted. Mild bibasilar opacities are present. There is no evidence of pneumothorax or large pleural effusion.  No acute bony abnormalities are identified. Degenerative changes in the shoulders again noted.   IMPRESSION: Cardiomegaly with mild pulmonary vascular congestion and mild bibasilar opacities which may represent atelectasis or airspace disease/pneumonia.  Cardiac Studies:  Echocardiogram 06/20/2022:  1. Left ventricular ejection fraction, by estimation, is 60 to 65%. The left ventricle has normal function. The left ventricle has no regional wall motion abnormalities. There is moderate concentric left ventricular hypertrophy. Left ventricular  diastolic  parameters are consistent with Grade I diastolic dysfunction (impaired relaxation). Elevated left atrial pressure.  2. Right ventricular systolic function is normal. The right ventricular size is normal.  3. Left atrial size was moderately dilated.  4. The mitral valve is normal in structure. No evidence of mitral valve regurgitation. No evidence of mitral stenosis.  5. The aortic valve is severely calcified with restricted leaflet excursion. Visually the valve appears severely stenotic. The aortic valve mean gradient is 47mHg with a peak gradient of 69mg. AVA by VTI of 0.56cm2. Aortic valve mean gradient  measures 18.0 mmHg. Aortic valve peak gradient measures 30.9 mmHg. Aortic  valve area, by VTI measures 0.84 cm. DI 0.33.   6. The inferior vena cava is dilated in size with >50% respiratory variability, suggesting right atrial pressure of 8 mmHg.  Renal artery duplex  06/21/2022: Right: 1-59% stenosis of the right renal artery. Abnormal right  Resistive Index. Normal size right kidney.  Left:  1-59% stenosis of the left renal artery. Abnormal left Resisitve Index. Normal size of left kidney.  Right kidney length (cm)10.12, left kidney length (cm)10.07    EKG:   EKG 06/19/2022: Normal sinus rhythm at the rate of 54 bpm, normal axis, poor R progression, probably normal variant.  Single PAC.  Scheduled Meds:  amiodarone  100 mg Oral QODAY   apixaban  2.5 mg Oral BID   budesonide  0.5 mg Nebulization BID   busPIRone  7.5 mg Oral QHS   carvedilol  3.125 mg Oral BID WC   dapagliflozin propanediol  10 mg Oral QAC breakfast   furosemide  40 mg Intravenous BID   ipratropium-albuterol  3 mL Nebulization Q6H   latanoprost  1 drop Both Eyes QHS   levothyroxine  125 mcg Oral QAC breakfast   melatonin  3 mg Oral QHS   multivitamin  1 tablet Oral Daily   rOPINIRole  1 mg Oral Q1400   rOPINIRole  2 mg Oral QHS   sacubitril-valsartan  1 tablet Oral BID   senna-docusate  2 tablet Oral QHS    Continuous Infusions: PRN Meds:.acetaminophen, albuterol, busPIRone, diclofenac Sodium, HYDROcodone-acetaminophen, ipratropium,  neomycin-polymyxin b-dexamethasone, polyethylene glycol, polyvinyl alcohol, prochlorperazine  Assessment  Lisa Mcclure is a 89 y.o. female patient with female with obesity, chronic diastolic CHF, hypertensive heart disease and chronic 3A-B kidney disease, paroxysmal atrial fibrillation, anemia secondary to hemorrhoids and diverticulosis, esophageal reflux, depression, OSA intolerant to CPAP on nocturnal 02, rheumatoid arthritis, and venous insufficiency, aortic and coronary calcification, s/p AAA repair on 12/20/2019   1.  Hypertensive emergency presenting with acute pulm edema 2.  Acute on chronic diastolic heart failure 3.  Paroxysmal atrial fibrillation 4.  Aortic stenosis, nonrheumatic  Plan:   1.  Patient is enrolled in RPM.  Data appears stable as noted below.  I have reviewed the data with the daughter and explained to her regarding the weight gain that she has gradually had.  I still believe that diet plays a major role and fluid restriction plays a major role in her presentation.  Will have dietitian come and see her and discussed regarding fluid restriction and salt restriction. . Weight is at baseline from post last HF hospitalization (204-206lb). Unable to get patient BP cuff since last OV due to not having BP cuffs.   Weight            lb         --          204.8 (202.2 - 211.4)   06/19/22 6:51 AM 204.2 lb  06/18/22 8:19 AM 208.0 lb  06/18/22 8:18 AM 206.2 lb  06/17/22 7:15 AM 204.8 lb  06/16/22 8:27 AM 204.2 lb  06/14/22 9:29 AM 206.2 lb      2.  Patient's blood pressure still elevated, however continue present medical therapy, will have permissive hypertension as we do not want to precipitate acute renal failure as she already has stage IIIa chronic kidney disease underlying.  I had discontinued losartan and switched her to [facility] which she is  tolerating.  Continue low-dose for now.  Also continue low-dose of carvedilol.  3.  She is maintaining sinus rhythm.  Telemetry reviewed.  Continue anticoagulation in view of high chads vascular score of 5, on Eliquis.  4.  I reviewed the report of the echocardiogram, although read as severe aortic stenosis, physical examination does not appear to suggest severe aortic stenosis.  Echo window is fairly poor but it does appear that the aortic valve is severely restricted.  She may benefit from TEE.  If patient stays in the hospital until tomorrow, I plan on performing TEE tomorrow if spot is available otherwise this can be done in the outpatient basis.  We could potentially watch her for today and discharge her home tomorrow or later this afternoon if she remains stable.  So far she has had a negative approximately 5 to 6 L of fluid.  I have placed orders for dietitian to see her, I discontinued her Lasix IV twice daily she has received 1 dose this morning, will transition her to oral Lasix.  I have also started her on Farxiga which will help with CHF as well.  Discussed with primary team.  Adrian Prows, MD, [facility] 06/22/2022, [place] AM Office: [phone] Fax: [phone] Pager: [phone]

## 2022-06-22 NOTE — Progress Notes (Signed)
TEE scheduled for tomorrow

## 2022-06-23 ENCOUNTER — Encounter (HOSPITAL_COMMUNITY): Payer: Self-pay | Admitting: Internal Medicine

## 2022-06-23 ENCOUNTER — Encounter (HOSPITAL_COMMUNITY)
Admission: EM | Disposition: A | Discharge status: Home Health | Payer: Self-pay | Source: Home / Self Care | Attending: Internal Medicine

## 2022-06-23 ENCOUNTER — Other Ambulatory Visit (HOSPITAL_COMMUNITY): Payer: Self-pay

## 2022-06-23 ENCOUNTER — Inpatient Hospital Stay (HOSPITAL_COMMUNITY): Payer: Medicare Other | Admitting: Anesthesiology

## 2022-06-23 ENCOUNTER — Inpatient Hospital Stay (HOSPITAL_COMMUNITY): Payer: Medicare Other

## 2022-06-23 DIAGNOSIS — G4730 Sleep apnea, unspecified: Secondary | ICD-10-CM

## 2022-06-23 DIAGNOSIS — J449 Chronic obstructive pulmonary disease, unspecified: Secondary | ICD-10-CM

## 2022-06-23 DIAGNOSIS — I5033 Acute on chronic diastolic (congestive) heart failure: Secondary | ICD-10-CM | POA: Diagnosis not present

## 2022-06-23 DIAGNOSIS — I350 Nonrheumatic aortic (valve) stenosis: Secondary | ICD-10-CM

## 2022-06-23 DIAGNOSIS — Z87891 Personal history of nicotine dependence: Secondary | ICD-10-CM

## 2022-06-23 HISTORY — PX: TEE WITHOUT CARDIOVERSION: SHX5443

## 2022-06-23 LAB — BASIC METABOLIC PANEL
Anion gap: 9 (ref 5–15)
BUN: 24 mg/dL — ABNORMAL HIGH (ref 8–23)
CO2: 31 mmol/L (ref 22–32)
Calcium: 8.6 mg/dL — ABNORMAL LOW (ref 8.9–10.3)
Chloride: 95 mmol/L — ABNORMAL LOW (ref 98–111)
Creatinine, Ser: 1.06 mg/dL — ABNORMAL HIGH (ref 0.44–1.00)
GFR, Estimated: 51 mL/min — ABNORMAL LOW (ref >60)
Glucose, Bld: 149 mg/dL — ABNORMAL HIGH (ref 70–99)
Potassium: 3.7 mmol/L (ref 3.5–5.1)
Sodium: 135 mmol/L (ref 135–145)

## 2022-06-23 SURGERY — ECHOCARDIOGRAM, TRANSESOPHAGEAL
Anesthesia: Monitor Anesthesia Care

## 2022-06-23 MED ORDER — PHENYLEPHRINE HCL-NACL 20-0.9 MG/250ML-% IV SOLN
INTRAVENOUS | Status: DC | PRN
  Administered 2022-06-23: 75 ug/min via INTRAVENOUS

## 2022-06-23 MED ORDER — DIPHENHYDRAMINE HCL 25 MG PO CAPS
25.0000 mg | ORAL_CAPSULE | Freq: Four times a day (QID) | ORAL | Status: DC | PRN
  Administered 2022-06-23 (×2): 25 mg via ORAL
  Filled 2022-06-23 (×2): qty 1

## 2022-06-23 MED ORDER — CARVEDILOL 3.125 MG PO TABS
3.1250 mg | ORAL_TABLET | Freq: Two times a day (BID) | ORAL | 0 refills | Status: DC
  Filled 2022-06-23: qty 60, 30d supply, fill #0

## 2022-06-23 MED ORDER — LIDOCAINE 2% (20 MG/ML) 5 ML SYRINGE
INTRAMUSCULAR | Status: DC | PRN
  Administered 2022-06-23: 50 mg via INTRAVENOUS

## 2022-06-23 MED ORDER — LIDOCAINE 5 % EX PTCH: 1.0000 | MEDICATED_PATCH | CUTANEOUS | Status: DC

## 2022-06-23 MED ORDER — PROPOFOL 500 MG/50ML IV EMUL
INTRAVENOUS | Status: DC | PRN
  Administered 2022-06-23: 35 ug/kg/min via INTRAVENOUS

## 2022-06-23 MED ORDER — FUROSEMIDE 20 MG PO TABS
40.0000 mg | ORAL_TABLET | Freq: Every day | ORAL | 0 refills | Status: DC
  Filled 2022-06-23: qty 30, 15d supply, fill #0

## 2022-06-23 MED ORDER — SACUBITRIL-VALSARTAN 24-26 MG PO TABS
1.0000 | ORAL_TABLET | Freq: Two times a day (BID) | ORAL | 0 refills | Status: DC
  Filled 2022-06-23: qty 60, 30d supply, fill #0

## 2022-06-23 MED ORDER — PROPOFOL 10 MG/ML IV BOLUS
INTRAVENOUS | Status: DC | PRN
  Administered 2022-06-23 (×2): 30 mg via INTRAVENOUS

## 2022-06-23 MED ORDER — BISACODYL 10 MG RE SUPP
10.0000 mg | Freq: Once | RECTAL | Status: AC
  Administered 2022-06-23: 10 mg via RECTAL
  Filled 2022-06-23: qty 1

## 2022-06-23 MED ORDER — SODIUM CHLORIDE 0.9 % IV SOLN: INTRAVENOUS | Status: DC

## 2022-06-23 NOTE — Progress Notes (Signed)
  Echocardiogram Echocardiogram Transesophageal has been performed.  Lisa Mcclure 06/23/2022, 3:08 PM

## 2022-06-23 NOTE — Progress Notes (Signed)
SATURATION QUALIFICATIONS: (This note is used to comply with regulatory documentation for home oxygen)  Patient Saturations on Room Air at Rest = 87%  Patient Saturations on Room Air while Ambulating = 82%  Patient Saturations on 4 Liters of oxygen while Ambulating = 90%

## 2022-06-23 NOTE — Procedures (Signed)
TEE: Under moderate sedation, TEE was performed without complications: LV: Normal. Normal EF. RV: Normal LA: Moderately-severely dilated. Left atrial appendage: Normal without thrombus. Smoke present in LA RA: Normal   AV: thick and sclerotic valve. Moderate AS. Peak velocity 3 m/s. Surface probe used: Vmax 3.03 m/s. Vmean 2.21 m/s. Max peak gradient 37 mmHg. Mean gradient 22 mmHg. VTI 71.7   Thoracic and ascending aorta: Normal without significant plaque or atheromatous changes.  Patient stable for discharge home.

## 2022-06-23 NOTE — Transfer of Care (Signed)
Immediate Anesthesia Transfer of Care Note  Patient: Lisa Mcclure  Procedure(s) Performed: TRANSESOPHAGEAL ECHOCARDIOGRAM (TEE)  Patient Location: Endoscopy Unit  Anesthesia Type:MAC  Level of Consciousness: awake, alert , and oriented  Airway & Oxygen Therapy: Patient Spontanous Breathing and Patient connected to nasal cannula oxygen  Post-op Assessment: Report given to RN, Post -op Vital signs reviewed and stable, Patient moving all extremities X 4, and Patient able to stick tongue midline  Post vital signs: Reviewed  Last Vitals:  Vitals Value Taken Time  BP 131/54 06/23/22 1448  Temp 97.6   Pulse 71 06/23/22 1450  Resp 24 06/23/22 1450  SpO2 92 % 06/23/22 1450  Vitals shown include unvalidated device data.  Last Pain:  Vitals:   06/23/22 1334  TempSrc: Temporal  PainSc: 5       Patients Stated Pain Goal: 2 ([id number])  Complications: No notable events documented.

## 2022-06-23 NOTE — Progress Notes (Signed)
Mobility Specialist Progress Note:   06/23/22 1040  Mobility  Activity Ambulated with assistance in hallway  Level of Assistance Standby assist, set-up cues, supervision of patient - no hands on  Assistive Device Four wheel walker  Distance Ambulated (ft) 350 ft  Activity Response Tolerated well  $Mobility charge 1 Mobility   Pt in chair willing to participate in mobility. No complaints of pain. Left in chair with call bell in reach and all needs met.   Lisa Mcclure Lisa Mcclure Mobility Specialist Please contact via [employer] or  Rehab Office at [phone]

## 2022-06-23 NOTE — Anesthesia Postprocedure Evaluation (Signed)
Anesthesia Post Note  Patient: Lisa Mcclure  Procedure(s) Performed: TRANSESOPHAGEAL ECHOCARDIOGRAM (TEE)     Patient location during evaluation: PACU Anesthesia Type: MAC Level of consciousness: awake and alert Pain management: pain level controlled Vital Signs Assessment: post-procedure vital signs reviewed and stable Respiratory status: spontaneous breathing, nonlabored ventilation and respiratory function stable Cardiovascular status: stable and blood pressure returned to baseline Anesthetic complications: no   No notable events documented.  Last Vitals:  Vitals:   06/23/22 1500 06/23/22 1510  BP: 132/65 122/62  Pulse: 77   Resp: 19   Temp: 36.5 C   SpO2: 92% 94%    Last Pain:  Vitals:   06/23/22 1510  TempSrc:   PainSc: 0-No pain                 Audry Pili

## 2022-06-23 NOTE — TOC Benefit Eligibility Note (Signed)
Patient Teacher, English as a foreign language completed.    The patient is currently admitted and upon discharge could be taking Entresto 24-26 mg.  The current 30 day co-pay is $174.02.   The patient is insured through [facility], [place] Patient Advocate Specialist [place] Patient Advocate Team Direct Number: [phone]  Fax: [phone]

## 2022-06-23 NOTE — Progress Notes (Signed)
PROGRESS NOTE    Lisa Mcclure  [id number] DOB: 1933-01-22 DOA: 06/19/2022 PCP: [name], MD  [id number] w/ diastolic CHF , atrial fibrillation, COPD, and OSA who presented with dyspnea.  -found hypoxemic with 02 saturation 83% on room air,  In ED, cr 0,83, BNP 1,428 ,  troponin 16 and 14 , CXR with mid hyperinflation, with bilateral hilar vascular congestion and bilateral atelectasis. -Echo noted preserved EF, moderate to severe aortic stenosis  Subjective: N.p.o. for TEE today some itching on her skin, annoyed by dietary restrictions   Assessment and Plan:   Acute on chronic diastolic heart failure (HCC)  Mod to Severe AS Echo with EF 60 to 65%, moderate LVH, preserved RV systolic function, moderate LA dilatation, moderate to severe aortic stenosis.  -Diuresed with IV Lasix, she is 5 L negative -Appears close to euvolemic, changed to p.o. Lasix, cards following -Continue [name] -Plan for TEE today to evaluate aortic valve  Hypertension -Improved, meds as above  Paroxysmal atrial fibrillation (HCC) Continue amiodarone and carvedilol.  Anticoagulation with apixaban.   Chronic obstructive pulmonary disease (HCC) Continue with inhaled corticosteroids  Positive kyphosis with restrictive physiology. -Started DuoNebs, add flutter valve  OSA (obstructive sleep apnea) Cpap  Class 2 obesity Calculated BMI is 36,1   Restless leg syndrome, continue  ropinarole.  Depression and anxiety, continue buspirone.    Hypothyroidism Continue levothyroxine    DVT prophylaxis: apixaban Code Status: DNR Family Communication: daughter at bedside yesterday Disposition Plan: home later today  Consultants: CArds   Procedures:   Antimicrobials:    Objective: Vitals:   06/23/22 0500 06/23/22 0600 06/23/22 0727 06/23/22 0728  BP:      Pulse: 61 69    Resp:      Temp:      TempSrc:      SpO2: 95% 96% 92% 92%  Weight:      Height:         Intake/Output Summary (Last 24 hours) at 06/23/2022 1021 Last data filed at 06/22/2022 1700 Gross per 24 hour  Intake 480 ml  Output --  Net 480 ml   Filed Weights   06/21/22 2343 06/22/22 1000 06/23/22 0019  Weight: 90.6 kg 90.5 kg 90.5 kg    Examination:  Gen: Obese pleasant female sitting up in bed, AAOx3 HEENT: No JVD CVS: S1-S2, regular rhythm, systolic murmur Lungs: Few scattered rhonchi, poor air movement bilaterally Abdomen: Soft, nontender, bowel sounds present Extremities: Trace edema  Skin: no new rashes on exposed skin    Data Reviewed:   CBC: Recent Labs  Lab 06/19/22 1720 06/20/22 0254  WBC 9.4 9.8  NEUTROABS 7.6  --   HGB 13.8 13.5  HCT 44.7 43.4  MCV 99.8 99.8  PLT 250 [id number]   Basic Metabolic Panel: Recent Labs  Lab 06/19/22 1720 06/20/22 0254 06/21/22 0424 06/22/22 0032 06/23/22 0044  NA 138 139 138 136 135  K 4.0 3.8 3.8 3.6 3.7  CL 99 98 95* 94* 95*  CO2 31 34* 31 34* 31  GLUCOSE 100* 132* 92 112* 149*  BUN 13 17 24* 21 24*  CREATININE 0.83 1.08* 1.05* 1.11* 1.06*  CALCIUM 8.7* 8.5* 8.3* 8.4* 8.6*  MG  --  2.1 2.1  --   --   PHOS  --  5.1*  --   --   --    GFR: Estimated Creatinine Clearance: 40.4 mL/min (A) (by C-G formula based on SCr of 1.06 mg/dL (H)). Liver Function Tests: Recent  Labs  Lab 06/19/22 1720  AST 40  ALT 35  ALKPHOS 89  BILITOT 0.5  PROT 7.6  ALBUMIN 3.5   No results for input(s): "LIPASE", "AMYLASE" in the last 168 hours. No results for input(s): "AMMONIA" in the last 168 hours. Coagulation Profile: No results for input(s): "INR", "PROTIME" in the last 168 hours. Cardiac Enzymes: No results for input(s): "CKTOTAL", "CKMB", "CKMBINDEX", "TROPONINI" in the last 168 hours. BNP (last 3 results) No results for input(s): "PROBNP" in the last 8760 hours. HbA1C: No results for input(s): "HGBA1C" in the last 72 hours. CBG: No results for input(s): "GLUCAP" in the last 168 hours. Lipid Profile: No results  for input(s): "CHOL", "HDL", "LDLCALC", "TRIG", "CHOLHDL", "LDLDIRECT" in the last 72 hours. Thyroid Function Tests: No results for input(s): "TSH", "T4TOTAL", "FREET4", "T3FREE", "THYROIDAB" in the last 72 hours.  Anemia Panel: No results for input(s): "VITAMINB12", "FOLATE", "FERRITIN", "TIBC", "IRON", "RETICCTPCT" in the last 72 hours. Urine analysis:    Component Value Date/Time   COLORURINE STRAW (A) 09/13/2018 1740   APPEARANCEUR CLEAR 09/13/2018 1740   LABSPEC 1.008 09/13/2018 1740   PHURINE 7.0 09/13/2018 1740   GLUCOSEU NEGATIVE 09/13/2018 1740   HGBUR NEGATIVE 09/13/2018 1740   BILIRUBINUR NEGATIVE 09/13/2018 1740   KETONESUR NEGATIVE 09/13/2018 1740   PROTEINUR NEGATIVE 09/13/2018 1740   UROBILINOGEN 0.2 01/06/2009 0808   NITRITE NEGATIVE 09/13/2018 1740   LEUKOCYTESUR NEGATIVE 09/13/2018 1740   Sepsis Labs: '@LABRCNTIP'$ (procalcitonin:4,lacticidven:4)  ) Recent Results (from the past 240 hour(s))  MRSA Next Gen by PCR, Nasal     Status: None   Collection Time: 06/21/22  1:39 PM   Specimen: Nasal Mucosa; Nasal Swab  Result Value Ref Range Status   MRSA by PCR Next Gen NOT DETECTED NOT DETECTED Final    Comment: (NOTE) The GeneXpert MRSA Assay (FDA approved for NASAL specimens only), is one component of a comprehensive MRSA colonization surveillance program. It is not intended to diagnose MRSA infection nor to guide or monitor treatment for MRSA infections. Test performance is not FDA approved in patients less than 65 years old. Performed at [facility], [place] [address], [place], [place] [address]      Radiology Studies: No results found.   Scheduled Meds:  amiodarone  100 mg Oral QODAY   apixaban  2.5 mg Oral BID   budesonide  0.5 mg Nebulization BID   busPIRone  7.5 mg Oral QHS   carvedilol  3.125 mg Oral BID WC   dapagliflozin propanediol  10 mg Oral QAC breakfast   furosemide  40 mg Oral Daily   guaiFENesin  600 mg Oral BID    ipratropium-albuterol  3 mL Nebulization Q6H   latanoprost  1 drop Both Eyes QHS   levothyroxine  125 mcg Oral QAC breakfast   melatonin  3 mg Oral QHS   multivitamin  1 tablet Oral Daily   nystatin   Topical BID   rOPINIRole  1 mg Oral Q1400   rOPINIRole  2 mg Oral QHS   sacubitril-valsartan  1 tablet Oral BID   senna-docusate  2 tablet Oral QHS   Continuous Infusions:  sodium chloride       LOS: 4 days    Time spent: 75mn    PDomenic Polite MD [facility]   06/23/2022, 10:21 AM

## 2022-06-23 NOTE — Anesthesia Preprocedure Evaluation (Addendum)
Anesthesia Evaluation  Patient identified by MRN, date of birth, ID band Patient awake    Reviewed: Allergy & Precautions, NPO status , Patient's Chart, lab work & pertinent test results  History of Anesthesia Complications Negative for: history of anesthetic complications  Airway Mallampati: II  TM Distance: >3 FB Neck ROM: Full    Dental  (+) Dental Advisory Given   Pulmonary sleep apnea and Oxygen sleep apnea , COPD,  COPD inhaler, former smoker   Pulmonary exam normal        Cardiovascular hypertension, Pt. on home beta blockers and Pt. on medications + dysrhythmias Atrial Fibrillation + Valvular Problems/Murmurs AS  Rhythm:Regular Rate:Normal + Systolic murmurs  '24 TTE - EF 60 to 65%. There is moderate concentric left ventricular hypertrophy. Grade I diastolic dysfunction (impaired relaxation). Elevated left atrial pressure. Left atrial size was moderately dilated. Visually the valve appears severely stenotic. The aortic valve mean gradient is 15mHg with a peak gradient of 629mg. AVA by VTI of  0.56cm2.      Neuro/Psych  PSYCHIATRIC DISORDERS Anxiety Depression    negative neurological ROS     GI/Hepatic Neg liver ROS,GERD  Controlled,,  Endo/Other  Hypothyroidism    Renal/GU negative Renal ROS Bladder dysfunction      Musculoskeletal  (+) Arthritis , Rheumatoid disorders,    Abdominal   Peds  Hematology  On eliquis    Anesthesia Other Findings   Reproductive/Obstetrics                             Anesthesia Physical Anesthesia Plan  ASA: 4  Anesthesia Plan: MAC   Post-op Pain Management: Minimal or no pain anticipated   Induction:   PONV Risk Score and Plan: 2 and Propofol infusion and Treatment may vary due to age or medical condition  Airway Management Planned: Nasal Cannula and Natural Airway  Additional Equipment: None  Intra-op Plan:   Post-operative Plan:    Informed Consent: I have reviewed the patients History and Physical, chart, labs and discussed the procedure including the risks, benefits and alternatives for the proposed anesthesia with the patient or authorized representative who has indicated his/her understanding and acceptance.   Patient has DNR.  Discussed DNR with patient and Suspend DNR.     Plan Discussed with: CRNA and Anesthesiologist  Anesthesia Plan Comments:         Anesthesia Quick Evaluation

## 2022-06-23 NOTE — Plan of Care (Signed)

## 2022-06-23 NOTE — Progress Notes (Signed)
PT Cancellation Note  Patient Details Name: Lisa Mcclure MRN: [id number] DOB: 06/03/1932   Cancelled Treatment:    Reason Eval/Treat Not Completed: Patient at procedure or test/unavailable (Pt off the floor at procedure. Will try again if time allows.)   Viann Shove 06/23/2022, 2:04 PM

## 2022-06-23 NOTE — Plan of Care (Signed)
Nutrition Education Note  RD consulted for nutrition education ('2000mg'$  sodium restriction and 1.4L fluid restriction) relating to acute exacerbation of CHF.  RD provided "Low Sodium Nutrition Therapy" and "Fluid-Restricted Nutrition Therapy"  handout from the Academy of Nutrition and Dietetics. Reviewed patient's dietary recall.   Patient gets up early in the morning, she has a toaster strudel or scrambled eggs with coffee for breakfast; lunch may include homemade soup to reduce amount of sodium compared to store bought soups, or occasionally a sandwich; dinner varies but may include shrimp and grits, baked/air fried pork chop, homemade apple sauce and cauliflower. She rarely adds extra salt to her foods. She has cut back on frequency of dining out. However, when she does, she asks for reduced seasoning and and dressings on the side. Occasionally, she eats a high protein Lean Cuisine but she tries to compare products to find lower sodium options.   Encouraged continued intake of fresh fruits and vegetables as well as whole grain sources of carbohydrates to maximize fiber intake.   RD discussed why it is important for patient to adhere to diet recommendations, and emphasized the role of fluids, foods that are also considered liquids, foods to limit, and importance of weighing self daily. Teach back method used.  Expect good compliance. It is apparent she has worked hard to reduce sodium as much as possible in her daily diet. Suspect fluctuations to occur from day to day. Her daughter has purchased a low sodium cookbook to help with meal ideas.    Body mass index is 34.79 kg/m. Pt meets criteria for obesity based on current BMI. Weight likely also elevated at this time due to presence of edema.  Current diet order is NPO for procedure today, patient was consuming approximately 100% of meals prior to this. Labs and medications reviewed. No further nutrition interventions warranted at this time. RD  contact information provided. If additional nutrition issues arise, please re-consult RD.   Clayborne Dana, RDN, LDN Clinical Nutrition

## 2022-06-24 ENCOUNTER — Other Ambulatory Visit (HOSPITAL_COMMUNITY): Payer: Self-pay

## 2022-06-24 ENCOUNTER — Ambulatory Visit: Payer: Self-pay

## 2022-06-24 DIAGNOSIS — I5033 Acute on chronic diastolic (congestive) heart failure: Secondary | ICD-10-CM | POA: Diagnosis not present

## 2022-06-24 LAB — BASIC METABOLIC PANEL
Anion gap: 11 (ref 5–15)
BUN: 34 mg/dL — ABNORMAL HIGH (ref 8–23)
CO2: 30 mmol/L (ref 22–32)
Calcium: 8.5 mg/dL — ABNORMAL LOW (ref 8.9–10.3)
Chloride: 93 mmol/L — ABNORMAL LOW (ref 98–111)
Creatinine, Ser: 1.67 mg/dL — ABNORMAL HIGH (ref 0.44–1.00)
GFR, Estimated: 29 mL/min — ABNORMAL LOW (ref >60)
Glucose, Bld: 123 mg/dL — ABNORMAL HIGH (ref 70–99)
Potassium: 4.1 mmol/L (ref 3.5–5.1)
Sodium: 134 mmol/L — ABNORMAL LOW (ref 135–145)

## 2022-06-24 MED ORDER — ENTRESTO 24-26 MG PO TABS: 1.0000 | ORAL_TABLET | Freq: Two times a day (BID) | ORAL | 0 refills | Status: DC

## 2022-06-24 MED ORDER — FUROSEMIDE 20 MG PO TABS
20.0000 mg | ORAL_TABLET | Freq: Every day | ORAL | 0 refills | Status: DC
  Filled 2022-06-24: qty 30, 30d supply, fill #0

## 2022-06-24 MED ORDER — FUROSEMIDE 20 MG PO TABS
20.0000 mg | ORAL_TABLET | Freq: Every day | ORAL | 0 refills | Status: DC | PRN
  Filled 2022-06-24: qty 30, 30d supply, fill #0

## 2022-06-24 MED ORDER — DAPAGLIFLOZIN PROPANEDIOL 10 MG PO TABS: 10.0000 mg | ORAL_TABLET | Freq: Every day | ORAL | Status: DC

## 2022-06-24 MED ORDER — CARVEDILOL 3.125 MG PO TABS
3.1250 mg | ORAL_TABLET | Freq: Two times a day (BID) | ORAL | 0 refills | Status: DC
  Filled 2022-06-24 (×2): qty 60, 30d supply, fill #0

## 2022-06-24 NOTE — Care Management Important Message (Signed)
Important Message  Patient Details  Name: Lisa Mcclure MRN: [id number] Date of Birth: Feb 11, 1933   Medicare Important Message Given:  Yes     Shelda Altes 06/24/2022, 11:14 AM

## 2022-06-24 NOTE — TOC Progression Note (Addendum)
Transition of Care [facility]) - Progression Note    Patient Details  Name: Lisa Mcclure MRN: [id number] Date of Birth: 1933-03-02  Transition of [facility]) CM/SW Contact  Zenon Mayo, RN Phone Number: 06/24/2022, 9:50 AM  Clinical Narrative:    Patient is for dc today, she has home oxygen that she uses at night, MD wants a ambulatory check this am to see if she will need oxygen.  NCM contacted [place] with who she has oxygen with they will provide a tank for patient to go home with. Daughter will transport her home . NCM notified [facility] of dc.  Patient wants daughter to be here to go over dc instructions.   Expected Discharge Plan: Home/Self Care Barriers to Discharge: Continued Medical Work up  Expected Discharge Plan and Services In-house Referral: NA Discharge Planning Services: CM Consult Post Acute Care Choice: [place] arrangements for the past 2 months: Single Family Home Expected Discharge Date: 06/24/22               DME Arranged: N/A DME Agency: NA       HH Arranged: PT HH Agency: [place] Date [place]: 06/21/22 Time [place]: [place] Representative spoke with at [place]: [place] ([place]) Interventions SDOH Screenings   Food Insecurity: No Food Insecurity (06/21/2022)  Housing: Low Risk  (06/21/2022)  Transportation Needs: No Transportation Needs (06/21/2022)  Utilities: Not At Risk (06/21/2022)  Alcohol Screen: Low Risk  (08/11/2021)  Depression (PHQ2-9): Medium Risk (08/11/2021)  Financial Resource Strain: Low Risk  (08/11/2021)  Physical Activity: Inactive (08/11/2021)  Social Connections: Moderately Isolated (08/11/2021)  Stress: No Stress Concern Present (08/11/2021)  Tobacco Use: Medium Risk (06/23/2022)    Readmission Risk Interventions    06/21/2022    4:57 PM 12/25/2019    1:26 PM  Readmission Risk Prevention Plan  Transportation Screening Complete Complete  PCP or  Specialist Appt within 3-5 Days Complete Complete  HRI or Home Care Consult Complete Complete  Social Work Consult for [place] Planning/Counseling  Complete  Palliative Care Screening Not Applicable Not Applicable  Medication Review Press photographer) Complete Complete

## 2022-06-24 NOTE — Progress Notes (Signed)
Subjective:  Feels much better. Wheezing still present and also has persistent cough.   Daughter is present.  Intake/Output from previous day:  I/O last 3 completed shifts: In: 360 [P.O.:340; I.V.:20] Out: 700 [Urine:700] No intake/output data recorded. Net IO Since Admission: -2,810 mL [06/24/22 1128]  Blood pressure (!) 117/57, pulse 68, temperature 98 F (36.7 C), temperature source Oral, resp. rate 19, height 5' 3.5" (1.613 m), weight 88.2 kg, SpO2 90 %. Physical Exam Constitutional:      Appearance: She is obese.  Neck:     Vascular: Carotid bruit (bilateral) present. No JVD.  Cardiovascular:     Rate and Rhythm: Normal rate and regular rhythm.     Pulses: Intact distal pulses.     Heart sounds: S1 normal and S2 normal. Murmur heard.     Midsystolic murmur is present with a grade of 2/6 at the upper right sternal border radiating to the apex.     No gallop.  Pulmonary:     Effort: Pulmonary effort is normal.     Breath sounds: Examination of the right-lower field reveals rales. Examination of the left-lower field reveals rales. Wheezing (Occasional) and rales present.  Abdominal:     General: Bowel sounds are normal.     Palpations: Abdomen is soft.  Musculoskeletal:     Right lower leg: No edema.     Left lower leg: No edema.     Lab Results: Lab Results  Component Value Date   NA 134 (L) 06/24/2022   K 4.1 06/24/2022   CO2 30 06/24/2022   GLUCOSE 123 (H) 06/24/2022   BUN 34 (H) 06/24/2022   CREATININE 1.67 (H) 06/24/2022   CALCIUM 8.5 (L) 06/24/2022   EGFR 52 (L) 04/13/2022   GFRNONAA 29 (L) 06/24/2022    BNP (last 3 results) Recent Labs    04/23/22 0030 04/24/22 0050 06/19/22 1720  BNP 1,056.8* 855.1* 1,428.4*        Latest Ref Rng & Units 06/24/2022   12:40 AM 06/23/2022   12:44 AM 06/22/2022   12:32 AM  BMP  Glucose 70 - 99 mg/dL 123  149  112   BUN 8 - 23 mg/dL 34  24  21   Creatinine 0.44 - 1.00 mg/dL 1.67  1.06  1.11   Sodium 135 - 145  mmol/L 134  135  136   Potassium 3.5 - 5.1 mmol/L 4.1  3.7  3.6   Chloride 98 - 111 mmol/L 93  95  94   CO2 22 - 32 mmol/L 30  31  34   Calcium 8.9 - 10.3 mg/dL 8.5  8.6  8.4       Latest Ref Rng & Units 06/19/2022    5:20 PM 12/23/2019    2:24 PM 12/20/2019    3:20 PM  Hepatic Function  Total Protein 6.5 - 8.1 g/dL 7.6  7.1  6.9   Albumin 3.5 - 5.0 g/dL 3.5  2.9  3.1   AST 15 - 41 U/L 40  39  46   ALT 0 - 44 U/L 35  36  41   Alk Phosphatase 38 - 126 U/L 89  70  65   Total Bilirubin 0.3 - 1.2 mg/dL 0.5  0.5  0.7   Bilirubin, Direct 0.0 - 0.2 mg/dL  0.1  0.2       Latest Ref Rng & Units 06/20/2022    2:54 AM 06/19/2022    5:20 PM 04/24/2022   10:27 AM  CBC  WBC 4.0 - 10.5 K/uL 9.8  9.4  10.2   Hemoglobin 12.0 - 15.0 g/dL 13.5  13.8  13.3   Hematocrit 36.0 - 46.0 % 43.4  44.7  43.3   Platelets 150 - 400 K/uL 236  250  296    Lab Results  Component Value Date   CHOL 126 03/14/2019   HDL 49 03/14/2019   LDLCALC 61 03/14/2019   LDLDIRECT 159.2 12/19/2006   TRIG 82 03/14/2019   CHOLHDL 5.1 CALC 12/19/2006    TSH Recent Labs    06/19/22 1927  TSH 1.195    Imaging: DG Chest Portable 1 View  06/19/2022 CLINICAL DATA:  Shortness of breath EXAM: PORTABLE CHEST 1 VIEW COMPARISON:  07/27/2021  FINDINGS: This is a low volume study. Cardiomegaly and mild pulmonary vascular congestion noted. Mild bibasilar opacities are present. There is no evidence of pneumothorax or large pleural effusion.  No acute bony abnormalities are identified. Degenerative changes in the shoulders again noted.   IMPRESSION: Cardiomegaly with mild pulmonary vascular congestion and mild bibasilar opacities which may represent atelectasis or airspace disease/pneumonia.  Cardiac Studies:  Echocardiogram 06/20/2022:  1. Left ventricular ejection fraction, by estimation, is 60 to 65%. The left ventricle has normal function. The left ventricle has no regional wall motion abnormalities. There is moderate  concentric left ventricular hypertrophy. Left ventricular  diastolic parameters are consistent with Grade I diastolic dysfunction (impaired relaxation). Elevated left atrial pressure.  2. Right ventricular systolic function is normal. The right ventricular size is normal.  3. Left atrial size was moderately dilated.  4. The mitral valve is normal in structure. No evidence of mitral valve regurgitation. No evidence of mitral stenosis.  5. The aortic valve is severely calcified with restricted leaflet excursion. Visually the valve appears severely stenotic. The aortic valve mean gradient is 83mmHg with a peak gradient of 39mmHg. AVA by VTI of 0.56cm2. Aortic valve mean gradient  measures 18.0 mmHg. Aortic valve peak gradient measures 30.9 mmHg. Aortic  valve area, by VTI measures 0.84 cm. DI 0.33.   6. The inferior vena cava is dilated in size with >50% respiratory variability, suggesting right atrial pressure of 8 mmHg.  Renal artery duplex  06/21/2022: Right: 1-59% stenosis of the right renal artery. Abnormal right  Resistive Index. Normal size right kidney.  Left:  1-59% stenosis of the left renal artery. Abnormal left Resisitve Index. Normal size of left kidney.  Right kidney length (cm)10.12, left kidney length (cm)10.07    EKG:   EKG 06/19/2022: Normal sinus rhythm at the rate of 54 bpm, normal axis, poor R progression, probably normal variant.  Single PAC.  Scheduled Meds:  amiodarone  100 mg Oral QODAY   apixaban  2.5 mg Oral BID   budesonide  0.5 mg Nebulization BID   busPIRone  7.5 mg Oral QHS   carvedilol  3.125 mg Oral BID WC   [START ON 06/25/2022] dapagliflozin propanediol  10 mg Oral QAC breakfast   furosemide  40 mg Oral Daily   guaiFENesin  600 mg Oral BID   ipratropium-albuterol  3 mL Nebulization Q6H   latanoprost  1 drop Both Eyes QHS   levothyroxine  125 mcg Oral QAC breakfast   lidocaine  1 patch Transdermal Q24H   melatonin  3 mg Oral QHS   multivitamin  1  tablet Oral Daily   nystatin   Topical BID   rOPINIRole  1 mg Oral Q1400   rOPINIRole  2 mg Oral  QHS   senna-docusate  2 tablet Oral QHS   Continuous Infusions: PRN Meds:.acetaminophen, albuterol, busPIRone, diclofenac Sodium, diphenhydrAMINE, guaiFENesin-dextromethorphan, HYDROcodone-acetaminophen, ipratropium, neomycin-polymyxin b-dexamethasone, polyethylene glycol, polyvinyl alcohol, prochlorperazine  Assessment  Lisa Mcclure is a 89 y.o. female patient with female with obesity, chronic diastolic CHF, hypertensive heart disease and chronic 3A-B kidney disease, paroxysmal atrial fibrillation, anemia secondary to hemorrhoids and diverticulosis, esophageal reflux, depression, OSA intolerant to CPAP on nocturnal 02, rheumatoid arthritis, and venous insufficiency, aortic and coronary calcification, s/p AAA repair on 12/20/2019   1. Acute pulm edema resolved 2.  Acute on chronic diastolic heart failure 3.  Paroxysmal atrial fibrillation 4.  Moderate Aortic stenosis, nonrheumatic 5. Acute on chronic stage 3b CKD with acute tubular necrosis  Plan:   1.  Patient is enrolled in RPM.  She will weight herself today at home for baseline weight.  Continue Low dose entresto and Jardiance and coreg. I will f/u on renal function as OP and hold lasix today and reduce home dose to 20 mg daily PRN for weight gain of > 2 Lbs. Medication change made.  PAF maintains sinus and on anticoagulation. Will adjust Eliquis dose depending upon renal function recovery.  Diet and fluid restriction education done.   Spoke to daughter who cooks and discussed importance of low salt diet.  OV in 5-6 days set up for [facility] visit with me   Home today and I will fu on Cr.   Adrian Prows, MD, [facility] 06/24/2022, 11:28 AM Office: [phone] Fax: [phone] Pager: [phone]

## 2022-06-24 NOTE — Progress Notes (Signed)
Nurse requested Mobility Specialist to perform oxygen saturation test with pt which includes removing pt from oxygen both at rest and while ambulating.  Below are the results from that testing.     Patient Saturations on Room Air at Rest = spO2 93%  Patient Saturations on Room Air while Ambulating = sp02 87% .    Patient Saturations on 2 Liters of oxygen while Ambulating = sp02 94%  At end of testing pt left in room on 1  Liters of oxygen.  Reported results to nurse.

## 2022-06-24 NOTE — Progress Notes (Signed)
Nurse requested Mobility Specialist to perform oxygen saturation test with pt which includes removing pt from oxygen both at rest and while ambulating.  Below are the results from that testing.      Patient Saturations on Room Air at Rest = spO2 93%   Patient Saturations on Room Air while Ambulating = sp02 87% .     Patient Saturations on 2 Liters of oxygen while Ambulating = sp02 94%   At end of testing pt left in room on 1  Liters of oxygen.   Reported results to nurse.

## 2022-06-24 NOTE — Plan of Care (Signed)
  Problem: Acute Rehab OT Goals (only OT should resolve) Goal: Pt. Will Perform Upper Body Dressing Outcome: Adequate for Discharge Goal: Pt. Will Perform Lower Body Dressing Outcome: Adequate for Discharge Goal: Pt. Will Transfer To Toilet Outcome: Adequate for Discharge Goal: OT Additional ADL Goal #1 Outcome: Adequate for Discharge   Problem: Acute Rehab PT Goals(only PT should resolve) Goal: Pt Will Go Supine/Side To Sit Outcome: Adequate for Discharge Goal: Pt Will Go Sit To Supine/Side Outcome: Adequate for Discharge Goal: Patient Will Transfer Sit To/From Stand Outcome: Adequate for Discharge Goal: Pt Will Ambulate Outcome: Adequate for Discharge Goal: PT Additional Goal #1 Outcome: Adequate for Discharge

## 2022-06-24 NOTE — Consult Note (Signed)
   [facility] Inpatient Consult   06/24/2022  Lisa Mcclure September 15, 1932 [id number]  [place] Organization [ACO] Patient: Medicare ACO REACH  Primary Care Provider:  [name], MD    Patient is currently active with [facility] for chronic disease management services.  Patient has been engaged by a [place].  Our community based plan of care has focused on disease management and community resource support.    Collaboration with [place] for ongoing care coordination needs.   Plan: Made Inpatient Transition Of Care [TOC] team member to make aware that [facility] following.  For home with [facility] with Alvis Lemmings  Of note, [place] [facility] services does not replace or interfere with any services that are needed or arranged by inpatient [facility] care management team.   For additional questions or referrals please contact:  Natividad Brood, RN BSN [place]  [phone] business mobile phone Toll free office [phone]  *[place]  [phone] Fax number: [phone] [email or web address]

## 2022-06-24 NOTE — Progress Notes (Addendum)
Explained discharge instructions to patient. Reviewed follow up appointment and next medication administration times. Also reviewed education. Patient verbalized having an understanding for instructions given. All belongings are in the patient's possession to include one TOC med. Spoke with [place] about the patient's Lasix. According [facility] pharmacy the patient insurance says she isn't due for a refill of lasix. Verified with the patient that she does have lasix at home. Renee NT removed both the IV and telemetry. CCMD was notified. Patient had questions about her taking Entreso. Verified with Md latest notes that said the patient was supposed to take [facility]. Debra CM conformed with Md. Ganji that the patient is to stop by his office to pick up samples of Ent[place]o. He didn't write a prescription. Patient verbalized understanding.  No other needs verbalized. Transported downstairs for discharge.

## 2022-06-24 NOTE — Discharge Summary (Signed)
Physician Discharge Summary  ESMI EVERTSEN [id number] DOB: April 07, 1933 DOA: 06/19/2022  PCP: [name], MD  Admit date: 06/19/2022 Discharge date: 06/24/2022  Time spent: 45 minutes  Recommendations for Outpatient Follow-up:  Cardiology Dr. Einar Gip to arrange follow-up next week, please check BMP at follow-up   Discharge Diagnoses:  Principal Problem:   Acute on chronic diastolic heart failure (HCC) COPD Moderate aortic stenosis   Hypertension   Acute renal failure with acute tubular necrosis superimposed on stage 3b chronic kidney disease (HCC)   Paroxysmal atrial fibrillation (HCC)   Chronic obstructive pulmonary disease (HCC)   OSA (obstructive sleep apnea)   Class 2 obesity   Hypothyroidism   Discharge Condition: Improved  Diet recommendation: Low-sodium, heart healthy  Filed Weights   06/22/22 1000 06/23/22 0019 06/24/22 0638  Weight: 90.5 kg 90.5 kg 88.2 kg    History of present illness:  [id number] w/ diastolic CHF , atrial fibrillation, COPD, and OSA who presented with dyspnea.  -found hypoxemic with 02 saturation 83% on room air,  In ED, cr 0,83, BNP 1,428 ,  troponin 16 and 14 , CXR with mid hyperinflation, with bilateral hilar vascular congestion and bilateral atelectasis. -Echo noted preserved EF, moderate to severe aortic stenosis  Hospital Course:    Acute on chronic diastolic heart failure (HCC)  Mod aortic stenosis Echo with EF 60 to 65%, moderate LVH, preserved RV systolic function, moderate LA dilatation, moderate to severe aortic stenosis.  -Diuresed with IV Lasix, she is 6 L negative -Now appears euvolemic, creatinine bumped to 1.6, Lasix discontinued, per cardiology recommendations she will be continued on Entresto and Farxiga with as needed Lasix -Could not be weaned off O2 with activity, at baseline previously was on oxygen at bedtime only, now set up with daytime oxygen as well -TEE completed yesterday noted moderate aortic stenosis -Dr.  Einar Gip to arrange follow-up in clinic next week with labs   Hypertension -Improved, meds as above   Paroxysmal atrial fibrillation ([place]) Continue amiodarone and carvedilol.  Anticoagulation with apixaban.    Chronic obstructive pulmonary disease (HCC) Continue with inhaled corticosteroids  Positive kyphosis with restrictive physiology. -Started DuoNebs, add flutter valve   OSA (obstructive sleep apnea) Cpap   Class 2 obesity Calculated BMI is 36,1    Restless leg syndrome, continue  ropinarole.   Depression and anxiety, continue buspirone.     Hypothyroidism Continue levothyroxine       Procedures: TEE 3/14 noted moderate aortic stenosis, normal ejection fraction  Discharge Exam: Vitals:   06/24/22 0820 06/24/22 1027  BP: (!) 108/51 (!) 117/57  Pulse: 80 68  Resp: 19   Temp: 98 F (36.7 C)   SpO2: 90%    Gen: Awake, Alert, Oriented X 3,  HEENT: no JVD Lungs: Good air movement bilaterally, CTAB CVS: [id number] systolic murmur Abd: soft, Non tender, non distended, BS present Extremities: No edema Skin: no new rashes on exposed skin   Discharge Instructions   Discharge Instructions     Diet - low sodium heart healthy   Complete by: As directed    Diet regular   Complete by: As directed    Increase activity slowly   Complete by: As directed       Allergies as of 06/24/2022       Reactions   Statins Other (See Comments)   Muscle aches and INTERNAL BLEEDING   Atorvastatin Other (See Comments)   Myalgias   Gabapentin Other (See Comments)   "Made me loopy"  Methocarbamol Other (See Comments)   "Made me loopy"        Medication List     STOP taking these medications    atenolol 25 MG tablet Commonly known as: TENORMIN   losartan 25 MG tablet Commonly known as: COZAAR       TAKE these medications    albuterol 108 (90 Base) MCG/ACT inhaler Commonly known as: VENTOLIN HFA Inhale 2 puffs into the lungs every 6 (six) hours as needed for  wheezing or shortness of breath.   albuterol (2.5 MG/3ML) 0.083% nebulizer solution Commonly known as: PROVENTIL Take 3 mLs (2.5 mg total) by nebulization every 6 (six) hours as needed for wheezing or shortness of breath.   amiodarone 100 MG tablet Commonly known as: PACERONE Take 1 tablet (100 mg total) by mouth every other day.   apixaban 2.5 MG Tabs tablet Commonly known as: ELIQUIS Take 1 tablet (2.5 mg total) by mouth 2 (two) times daily. What changed: when to take this   budesonide 0.5 MG/2ML nebulizer solution Commonly known as: Pulmicort Take 2 mLs (0.5 mg total) by nebulization in the morning and at bedtime. What changed: when to take this   busPIRone 7.5 MG tablet Commonly known as: BUSPAR Take 7.5 mg by mouth See admin instructions. Take 7.5 mg by mouth at bedtime and an additional 7.5 mg once a day as needed for anxiety   carvedilol 3.125 MG tablet Commonly known as: COREG Take 1 tablet (3.125 mg total) by mouth 2 (two) times daily with a meal.   dapagliflozin propanediol 10 MG Tabs tablet Commonly known as: Farxiga Take 1 tablet (10 mg total) by mouth daily before breakfast.   Aleve Arthritis Pain 1 % Gel Generic drug: diclofenac Sodium Apply 2 g topically 4 (four) times daily as needed (for pain).   diclofenac sodium 1 % Gel Commonly known as: VOLTAREN Apply 2.25 g topically 3 (three) times daily as needed (for pain).   docusate sodium 100 MG capsule Commonly known as: COLACE Take 200 mg by mouth at bedtime.   Entresto 24-26 MG Generic drug: sacubitril-valsartan Take 1 tablet by mouth 2 (two) times daily. Start taking on: June 25, 2022   furosemide 20 MG tablet Commonly known as: LASIX Take 1 tablet (20 mg total) by mouth daily as needed. For weight gain of >2 Lbs What changed:  how much to take reasons to take this additional instructions   HYDROcodone-acetaminophen 7.5-325 MG tablet Commonly known as: NORCO Take 1 tablet by mouth every 6  (six) hours as needed for moderate pain. What changed: Another medication with the same name was removed. Continue taking this medication, and follow the directions you see here.   ipratropium 0.03 % nasal spray Commonly known as: ATROVENT Place 2 sprays into both nostrils every 12 (twelve) hours. What changed:  when to take this reasons to take this   latanoprost 0.005 % ophthalmic solution Commonly known as: XALATAN Place 1 drop into both eyes at bedtime.   levothyroxine 125 MCG tablet Commonly known as: SYNTHROID Take 125 mcg by mouth daily before breakfast.   melatonin 3 MG Tabs tablet Take 3 mg by mouth at bedtime.   neomycin-polymyxin-dexameth 0.1 % Oint Commonly known as: MAXITROL 1 Application See admin instructions. Apply to affected eye(s) at bedtime as needed- for "grittiness"   PreserVision AREDS 2+Multi Vit Caps Take 1 capsule by mouth in the morning and at bedtime.   rOPINIRole 1 MG tablet Commonly known as: REQUIP Take 1 mg  by mouth See admin instructions. Take 1 mg by mouth at 1 PM and 2 mg at 9 PM   Systane Complete PF 0.6 % Soln Generic drug: Propylene Glycol (PF) Place 1 drop into both eyes 4 (four) times daily as needed (for dryness).               Durable Medical Equipment  (From admission, onward)           Start     Ordered   06/24/22 1203  For home use only DME oxygen  Once       Question Answer Comment  Length of Need 6 Months   Mode or (Route) Nasal cannula   Liters per Minute 2   Frequency Continuous (stationary and portable oxygen unit needed)   Oxygen conserving device Yes   Oxygen delivery system Gas      06/24/22 1202           Allergies  Allergen Reactions   Statins Other (See Comments)    Muscle aches and INTERNAL BLEEDING   Atorvastatin Other (See Comments)    Myalgias   Gabapentin Other (See Comments)    "Made me loopy"   Methocarbamol Other (See Comments)    "Made me loopy"    Follow-up Information      Adrian Prows, MD Follow up on 06/28/2022.   Specialty: Cardiology Why: 06/28/22 at 11:15. Please bring all your medications to the visit Contact information: [place] [address] [phone]         [name], MD Follow up.   Specialty: Internal Medicine Why: Please follow up in a week. Contact information: [address] [place] [address] [phone]                  The results of significant diagnostics from this hospitalization (including imaging, microbiology, ancillary and laboratory) are listed below for reference.    Significant Diagnostic Studies: VAS US RENAL ARTERY DUPLEX  Result Date: 06/21/2022 ABDOMINAL VISCERAL Patient Name:  Lisa Mcclure  Date of Exam:   06/21/2022 Medical Rec #: [id number]          Accession #:    [id number] Date of Birth: 03-09-1933         Patient Gender: F Patient Age:   76 years Exam Location:  [facility] Procedure:      VAS US RENAL ARTERY DUPLEX Referring Phys: [id number] [place] -------------------------------------------------------------------------------- Indications: Hypertension High Risk Factors: Hypertension, coronary artery disease. Other Factors: GERD, Chronic kidney disease. Vascular Interventions: 12/20/19 - Endovascular repair of infrarenal abdominal                         aortic aneurysm. Limitations: Obesity. Performing Technologist: Oda Cogan RDMS, RVT  Examination Guidelines: A complete evaluation includes B-mode imaging, spectral Doppler, color Doppler, and power Doppler as needed of all accessible portions of each vessel. Bilateral testing is considered an integral part of a complete examination. Limited examinations for reoccurring indications may be performed as noted.  Duplex Findings: +----------------------+--------+--------+------+----------------+ Mesenteric            PSV cm/sEDV cm/sPlaque    Comments      +----------------------+--------+--------+------+----------------+ Aorta Mid                89                 Patent endograft +----------------------+--------+--------+------+----------------+ Celiac Artery Proximal  not visualized  +----------------------+--------+--------+------+----------------+ SMA Proximal                                 not visualized  +----------------------+--------+--------+------+----------------+   +------------------+--------+--------+-------+ Right Renal ArteryPSV cm/sEDV cm/sComment +------------------+--------+--------+-------+ Origin              159      24           +------------------+--------+--------+-------+ Proximal            168      27           +------------------+--------+--------+-------+ Mid                 131      22           +------------------+--------+--------+-------+ Distal               80      14           +------------------+--------+--------+-------+ +-----------------+--------+--------+-------+ Left Renal ArteryPSV cm/sEDV cm/sComment +-----------------+--------+--------+-------+ Origin             116      22           +-----------------+--------+--------+-------+ Proximal           148      22           +-----------------+--------+--------+-------+ Mid                115      20           +-----------------+--------+--------+-------+ Distal              66      14           +-----------------+--------+--------+-------+ +------------+--------+--------+----+-----------+--------+--------+----+ Right KidneyPSV cm/sEDV cm/sRI  Left KidneyPSV cm/sEDV cm/sRI   +------------+--------+--------+----+-----------+--------+--------+----+ Upper Pole  38      9       0.75Upper Pole 42      8       0.80 +------------+--------+--------+----+-----------+--------+--------+----+ Mid         67      11      0.83Mid        27      8       0.71  +------------+--------+--------+----+-----------+--------+--------+----+ Lower Pole  42      9       0.79Lower Pole 34      9       0.75 +------------+--------+--------+----+-----------+--------+--------+----+ Hilar       32      7       0.77Hilar      34      11      0.69 +------------+--------+--------+----+-----------+--------+--------+----+ +------------------+-----+------------------+-----+ Right Kidney           Left Kidney             +------------------+-----+------------------+-----+ RAR                    RAR                     +------------------+-----+------------------+-----+ RAR (manual)      1.8  RAR (manual)      1.6   +------------------+-----+------------------+-----+ Cortex                 Cortex                  +------------------+-----+------------------+-----+  Cortex thickness       Corex thickness         +------------------+-----+------------------+-----+ Kidney length (cm)10.12Kidney length (cm)10.07 +------------------+-----+------------------+-----+  Summary: Renal:  Right: 1-59% stenosis of the right renal artery. Abnormal right        Resistive Index. Normal size right kidney. Left:  1-59% stenosis of the left renal artery. Abnormal left        Resisitve Index. Normal size of left kidney.  *See table(s) above for measurements and observations.  Diagnosing physician: Servando Snare MD  Electronically signed by Servando Snare MD on 06/21/2022 at 4:51:48 PM.    Final    ECHOCARDIOGRAM COMPLETE  Result Date: 06/20/2022    ECHOCARDIOGRAM REPORT   Patient Name:   MARGERT SIMAN Date of Exam: 06/20/2022 Medical Rec #:  [id number]         Height:       63.0 in Accession #:    [id number]        Weight:       204.0 lb Date of Birth:  Feb 07, 1933        BSA:          1.950 m Patient Age:    89 years          BP:           162/63 mmHg Patient Gender: F                 HR:           48 bpm. Exam Location:  Inpatient Procedure: 2D Echo, Cardiac Doppler  and Color Doppler Indications:    CHF-Acute Diastolic [id number]  History:        Patient has prior history of Echocardiogram examinations, most                 recent 04/22/2021. CHF, COPD, Arrythmias:Atrial Fibrillation and                 Bradycardia; Risk Factors:Hypertension and Sleep Apnea.  Sonographer:    Ronny Flurry Referring Phys: [id number] [place]  1. Left ventricular ejection fraction, by estimation, is 60 to 65%. The left ventricle has normal function. The left ventricle has no regional wall motion abnormalities. There is moderate concentric left ventricular hypertrophy. Left ventricular diastolic parameters are consistent with Grade I diastolic dysfunction (impaired relaxation). Elevated left atrial pressure.  2. Right ventricular systolic function is normal. The right ventricular size is normal.  3. Left atrial size was moderately dilated.  4. The mitral valve is normal in structure. No evidence of mitral valve regurgitation. No evidence of mitral stenosis.  5. The aortic valve is severely calcified with restricted leaflet excursion. Visually the valve appears severely stenotic. The aortic valve mean gradient is 25mmHg with a peak gradient of 56mmHg. AVA by VTI of 0.56cm2.  6. The inferior vena cava is dilated in size with >50% respiratory variability, suggesting right atrial pressure of 8 mmHg. FINDINGS  Left Ventricle: Left ventricular ejection fraction, by estimation, is 60 to 65%. The left ventricle has normal function. The left ventricle has no regional wall motion abnormalities. The left ventricular internal cavity size was normal in size. There is  moderate concentric left ventricular hypertrophy. Left ventricular diastolic parameters are consistent with Grade I diastolic dysfunction (impaired relaxation). Elevated left atrial pressure. Right Ventricle: The right ventricular size is normal. No increase in right ventricular wall thickness. Right ventricular systolic function is  normal. Left Atrium: Left atrial size  was moderately dilated. Right Atrium: Right atrial size was normal in size. Pericardium: There is no evidence of pericardial effusion. Mitral Valve: The mitral valve is normal in structure. Mild mitral annular calcification. No evidence of mitral valve regurgitation. No evidence of mitral valve stenosis. Tricuspid Valve: The tricuspid valve is normal in structure. Tricuspid valve regurgitation is mild . No evidence of tricuspid stenosis. Aortic Valve: The aortic valve is normal in structure. There is severe calcifcation of the aortic valve. There is moderate thickening of the aortic valve. There is mild aortic valve annular calcification. Aortic valve regurgitation is not visualized. Moderate to severe aortic stenosis is present. Aortic valve mean gradient measures 18.0 mmHg. Aortic valve peak gradient measures 30.9 mmHg. Aortic valve area, by VTI measures 0.84 cm. Pulmonic Valve: The pulmonic valve was normal in structure. Pulmonic valve regurgitation is not visualized. No evidence of pulmonic stenosis. Aorta: The aortic root is normal in size and structure. Venous: The inferior vena cava is dilated in size with greater than 50% respiratory variability, suggesting right atrial pressure of 8 mmHg. IAS/Shunts: No atrial level shunt detected by color flow Doppler.  LEFT VENTRICLE PLAX 2D LVIDd:         3.60 cm   Diastology LVIDs:         2.70 cm   LV e' medial:    4.12 cm/s LV PW:         2.40 cm   LV E/e' medial:  25.6 LV IVS:        1.40 cm   LV e' lateral:   5.50 cm/s LVOT diam:     1.80 cm   LV E/e' lateral: 19.2 LV SV:         60 LV SV Index:   31 LVOT Area:     2.54 cm  RIGHT VENTRICLE             IVC RV S prime:     11.20 cm/s  IVC diam: 1.70 cm TAPSE (M-mode): 1.8 cm LEFT ATRIUM             Index        RIGHT ATRIUM           Index LA diam:        4.40 cm 2.26 cm/m   RA Area:     13.55 cm LA Vol (A2C):   77.3 ml 39.64 ml/m  RA Volume:   27.00 ml  13.85 ml/m LA Vol  (A4C):   78.5 ml 40.26 ml/m LA Biplane Vol: 79.6 ml 40.82 ml/m  AORTIC VALVE AV Area (Vmax):    0.88 cm AV Area (Vmean):   0.81 cm AV Area (VTI):     0.84 cm AV Vmax:           278.00 cm/s AV Vmean:          194.600 cm/s AV VTI:            0.710 m AV Peak Grad:      30.9 mmHg AV Mean Grad:      18.0 mmHg LVOT Vmax:         95.98 cm/s LVOT Vmean:        62.225 cm/s LVOT VTI:          0.235 m LVOT/AV VTI ratio: 0.33  AORTA Ao Root diam: 3.60 cm Ao Asc diam:  3.40 cm MITRAL VALVE                TRICUSPID  VALVE MV Area (PHT): 2.96 cm     TR Peak grad:   30.7 mmHg MV Decel Time: 256 msec     TR Vmax:        277.00 cm/s MV E velocity: 105.50 cm/s MV A velocity: 77.90 cm/s   SHUNTS MV E/A ratio:  1.35         Systemic VTI:  0.24 m                             Systemic Diam: 1.80 cm Aditya Sabharwal Electronically signed by Hebert Soho Signature Date/Time: 06/20/2022/2:13:43 PM    Final    DG Chest Portable 1 View  Result Date: 06/19/2022 CLINICAL DATA:  Shortness of breath EXAM: PORTABLE CHEST 1 VIEW COMPARISON:  07/27/2021 chest radiograph and prior studies FINDINGS: This is a low volume study. Cardiomegaly and mild pulmonary vascular congestion noted. Mild bibasilar opacities are present. There is no evidence of pneumothorax or large pleural effusion. No acute bony abnormalities are identified. Degenerative changes in the shoulders again noted. IMPRESSION: Cardiomegaly with mild pulmonary vascular congestion and mild bibasilar opacities which may represent atelectasis or airspace disease/pneumonia. Electronically Signed   By: Margarette Canada M.D.   On: 06/19/2022 17:44   DG Shoulder Left  Result Date: 06/14/2022 CLINICAL DATA:  Pain EXAM: LEFT SHOULDER - 2+ VIEW COMPARISON:  None Available. FINDINGS: The AP view is limited due to patient rotation. There appear to be severe glenohumeral degenerative changes and more mild AC joint degenerative changes. No fracture or dislocation. IMPRESSION: Significantly  limited study due to positioning. Severe glenohumeral degenerative changes. AC joint degenerative changes as well. Electronically Signed   By: Dorise Bullion III M.D.   On: 06/14/2022 17:07   DG Shoulder Right  Result Date: 06/14/2022 CLINICAL DATA:  Pain EXAM: RIGHT SHOULDER - 2+ VIEW COMPARISON:  None Available. FINDINGS: Severe glenohumeral degenerative changes identified with a misshapen humeral head. Complete loss of glenohumeral joint space. Mild AC joint degenerative changes. No fracture or dislocation. No other acute abnormalities. IMPRESSION: 1. Severe glenohumeral degenerative changes with complete loss of glenohumeral joint space. The humeral head is misshapen. 2. Mild AC joint degenerative changes. Electronically Signed   By: Dorise Bullion III M.D.   On: 06/14/2022 17:06   VAS [place] LOWER EXTREMITY VENOUS (DVT)  Result Date: 05/27/2022  Lower Venous DVT Study Patient Name:  OVA BOLHUIS  Date of Exam:   05/26/2022 Medical Rec #: [id number]          Accession #:    [id number] Date of Birth: [birth year]         Patient Gender: F Patient Age:   83 years Exam Location:  [facility] Procedure:      VAS [place] LOWER EXTREMITY VENOUS (DVT) Referring Phys: Colletta [place] EDWARDS --------------------------------------------------------------------------------  Indications: Pain, and Edema.  Comparison Study: Prior BLE venous study 03/09/15 negative Performing Technologist: McKayla Maag  Examination Guidelines: A complete evaluation includes B-mode imaging, spectral Doppler, color Doppler, and power Doppler as needed of all accessible portions of each vessel. Bilateral testing is considered an integral part of a complete examination. Limited examinations for reoccurring indications may be performed as noted. The reflux portion of the exam is performed with the patient in reverse Trendelenburg.  +---------+---------------+---------+-----------+----------+--------------+ RIGHT     CompressibilityPhasicitySpontaneityPropertiesThrombus Aging +---------+---------------+---------+-----------+----------+--------------+ CFV      Full           Yes  Yes                                 +---------+---------------+---------+-----------+----------+--------------+ SFJ      Full                                                        +---------+---------------+---------+-----------+----------+--------------+ FV Prox  Full                                                        +---------+---------------+---------+-----------+----------+--------------+ FV Mid   Full                                                        +---------+---------------+---------+-----------+----------+--------------+ FV DistalFull                                                        +---------+---------------+---------+-----------+----------+--------------+ PFV      Full                                                        +---------+---------------+---------+-----------+----------+--------------+ POP      Full           Yes      Yes                                 +---------+---------------+---------+-----------+----------+--------------+ PTV      Full                                                        +---------+---------------+---------+-----------+----------+--------------+ PERO     Full                                                        +---------+---------------+---------+-----------+----------+--------------+   +---------+---------------+---------+-----------+----------+--------------+ LEFT     CompressibilityPhasicitySpontaneityPropertiesThrombus Aging +---------+---------------+---------+-----------+----------+--------------+ CFV      Full           Yes      Yes                                 +---------+---------------+---------+-----------+----------+--------------+ SFJ      Full                                                         +---------+---------------+---------+-----------+----------+--------------+  FV Prox  Full                                                        +---------+---------------+---------+-----------+----------+--------------+ FV Mid   Full                                                        +---------+---------------+---------+-----------+----------+--------------+ FV DistalFull                                                        +---------+---------------+---------+-----------+----------+--------------+ PFV      Full                                                        +---------+---------------+---------+-----------+----------+--------------+ POP      Full           Yes      Yes                                 +---------+---------------+---------+-----------+----------+--------------+ PTV      Full                                                        +---------+---------------+---------+-----------+----------+--------------+ PERO     Full                                                        +---------+---------------+---------+-----------+----------+--------------+     Summary: BILATERAL: - No evidence of deep vein thrombosis seen in the lower extremities, bilaterally. -No evidence of popliteal cyst, bilaterally.   *See table(s) above for measurements and observations. Electronically signed by Deitra Mayo MD on 05/27/2022 at 10:33:52 AM.    Final     Microbiology: Recent Results (from the past 240 hour(s))  MRSA Next Gen by PCR, Nasal     Status: None   Collection Time: 06/21/22  1:39 PM   Specimen: Nasal Mucosa; Nasal Swab  Result Value Ref Range Status   MRSA by PCR Next Gen NOT DETECTED NOT DETECTED Final    Comment: (NOTE) The GeneXpert MRSA Assay (FDA approved for NASAL specimens only), is one component of a comprehensive MRSA colonization surveillance program. It is not intended to diagnose MRSA infection nor to  guide or monitor treatment for MRSA infections. Test performance is not FDA approved in patients less than 36 years old. Performed at [facility], [place] [address] [place], [place] [address]  Labs: Basic Metabolic Panel: Recent Labs  Lab 06/20/22 0254 06/21/22 0424 06/22/22 0032 06/23/22 0044 06/24/22 0040  NA 139 138 136 135 134*  K 3.8 3.8 3.6 3.7 4.1  CL 98 95* 94* 95* 93*  CO2 34* 31 34* 31 30  GLUCOSE 132* 92 112* 149* 123*  BUN 17 24* 21 24* 34*  CREATININE 1.08* 1.05* 1.11* 1.06* 1.67*  CALCIUM 8.5* 8.3* 8.4* 8.6* 8.5*  MG 2.1 2.1  --   --   --   PHOS 5.1*  --   --   --   --    Liver Function Tests: Recent Labs  Lab 06/19/22 1720  AST 40  ALT 35  ALKPHOS 89  BILITOT 0.5  PROT 7.6  ALBUMIN 3.5   No results for input(s): "LIPASE", "AMYLASE" in the last 168 hours. No results for input(s): "AMMONIA" in the last 168 hours. CBC: Recent Labs  Lab 06/19/22 1720 06/20/22 0254  WBC 9.4 9.8  NEUTROABS 7.6  --   HGB 13.8 13.5  HCT 44.7 43.4  MCV 99.8 99.8  PLT 250 236   Cardiac Enzymes: No results for input(s): "CKTOTAL", "CKMB", "CKMBINDEX", "TROPONINI" in the last 168 hours. BNP: BNP (last 3 results) Recent Labs    04/23/22 0030 04/24/22 0050 06/19/22 1720  BNP 1,056.8* 855.1* 1,428.4*    ProBNP (last 3 results) No results for input(s): "PROBNP" in the last 8760 hours.  CBG: No results for input(s): "GLUCAP" in the last 168 hours.     Signed:  Domenic Polite MD.  [facility] 06/24/2022, 2:44 PM

## 2022-06-24 NOTE — Progress Notes (Signed)
Mobility Specialist Progress Note:   06/24/22 1051  Mobility  Activity Ambulated with assistance in hallway  Level of Assistance Standby assist, set-up cues, supervision of patient - no hands on  Assistive Device Four wheel walker  Distance Ambulated (ft) 150 ft  Activity Response Tolerated well  $Mobility charge 1 Mobility   Pt in bed willing to participate in mobility. No complaints of pain. Left in chair with call bell in reach and all needs met.   Gareth Eagle Aitanna Haubner Mobility Specialist Please contact via [employer] or  Rehab Office at [phone]

## 2022-06-24 NOTE — Patient Outreach (Signed)
  Care Coordination   Follow Up Visit Note   06/24/2022 Name: Lisa Mcclure MRN: [id number] DOB: 08/06/1932  Lisa Mcclure is a 89 y.o. year old female who sees [name], MD for primary care. I reviewed patient's chart today in preparation to contact patient for a nurse care coordination follow up.   What matters to the patients health and wellness today?  Patient is currently inpatient at [facility].     Goals Addressed               This Visit's Progress     Patient Stated     I have started a new medication for my heart failure (pt-stated)        Care Coordination Interventions: Reviewed chart in preparation to contact patient for nurse care coordination follow up  Noted patient is currently inpatient at [facility]; admit date: 06/19/22; dx: Acute on Chronic Diastolic Heart Failure            SDOH assessments and interventions completed:  No     Care Coordination Interventions:  Yes, provided   Follow up plan: Follow up call scheduled for 07/01/22 @12 :30 PM    Encounter Outcome:  Pt. Visit Completed

## 2022-06-24 NOTE — Plan of Care (Signed)

## 2022-06-24 NOTE — TOC Transition Note (Addendum)
Transition of Care [facility]) - CM/SW Discharge Note   Patient Details  Name: Lisa Mcclure MRN: [id number] Date of Birth: [birth year]  [facility] CM/SW Contact:  Zenon Mayo, RN Phone Number: 06/24/2022, 11:13 AM   Clinical Narrative:    Patient is for dc today, she has the oxygen tank in the room, she wants her daughter to be here for dc instructions.  Daughter is on her way to hospital. Patient will be entresto, per Dr. Einar Gip, daughter is to come to his office to get samples. This information was given to daughter and patient.       Barriers to Discharge: Continued Medical Work up   Patient Goals and CMS Choice   Choice offered to / list presented to : NA  Discharge Placement                         Discharge Plan and Services Additional resources added to the After Visit Summary for   In-house Referral: NA Discharge Planning Services: CM Consult Post Acute Care Choice: Home Health          DME Arranged: N/A DME Agency: NA       HH Arranged: PT HH Agency: [place] Date [facility] Agency Contacted: 06/21/22 [place]: [place] Representative spoke with at [place]: [place] ([place]) Interventions SDOH Screenings   Food Insecurity: No Food Insecurity (06/21/2022)  Housing: Low Risk  (06/21/2022)  Transportation Needs: No Transportation Needs (06/21/2022)  Utilities: Not At Risk (06/21/2022)  Alcohol Screen: Low Risk  (08/11/2021)  Depression (PHQ2-9): Medium Risk (08/11/2021)  Financial Resource Strain: Low Risk  (08/11/2021)  Physical Activity: Inactive (08/11/2021)  Social Connections: Moderately Isolated (08/11/2021)  Stress: No Stress Concern Present (08/11/2021)  Tobacco Use: Medium Risk (06/23/2022)     Readmission Risk Interventions    06/21/2022    4:57 PM 12/25/2019    1:26 PM  Readmission Risk Prevention Plan  Transportation Screening Complete Complete  PCP or Specialist Appt within  3-5 Days Complete Complete  HRI or Home Care Consult Complete Complete  Social Work Consult for Retreat Planning/Counseling  Complete  Palliative Care Screening Not Applicable Not Applicable  Medication Review Press photographer) Complete Complete

## 2022-06-25 ENCOUNTER — Encounter (HOSPITAL_COMMUNITY): Payer: Self-pay | Admitting: Internal Medicine

## 2022-06-28 ENCOUNTER — Telehealth: Payer: Self-pay

## 2022-06-28 ENCOUNTER — Ambulatory Visit: Payer: Medicare Other | Admitting: Cardiology

## 2022-06-28 ENCOUNTER — Encounter: Payer: Self-pay | Admitting: Cardiology

## 2022-06-28 VITALS — BP 182/77 | HR 56 | Resp 18 | Ht 63.5 in | Wt 204.4 lb

## 2022-06-28 DIAGNOSIS — I350 Nonrheumatic aortic (valve) stenosis: Secondary | ICD-10-CM

## 2022-06-28 DIAGNOSIS — I129 Hypertensive chronic kidney disease with stage 1 through stage 4 chronic kidney disease, or unspecified chronic kidney disease: Secondary | ICD-10-CM | POA: Diagnosis not present

## 2022-06-28 DIAGNOSIS — N1832 Chronic kidney disease, stage 3b: Secondary | ICD-10-CM | POA: Diagnosis not present

## 2022-06-28 DIAGNOSIS — I10 Essential (primary) hypertension: Secondary | ICD-10-CM

## 2022-06-28 DIAGNOSIS — I5033 Acute on chronic diastolic (congestive) heart failure: Secondary | ICD-10-CM | POA: Diagnosis not present

## 2022-06-28 DIAGNOSIS — D631 Anemia in chronic kidney disease: Secondary | ICD-10-CM | POA: Diagnosis not present

## 2022-06-28 DIAGNOSIS — D6869 Other thrombophilia: Secondary | ICD-10-CM | POA: Diagnosis not present

## 2022-06-28 DIAGNOSIS — I130 Hypertensive heart and chronic kidney disease with heart failure and stage 1 through stage 4 chronic kidney disease, or unspecified chronic kidney disease: Secondary | ICD-10-CM | POA: Diagnosis not present

## 2022-06-28 DIAGNOSIS — I480 Paroxysmal atrial fibrillation: Secondary | ICD-10-CM | POA: Diagnosis not present

## 2022-06-28 LAB — ECHO TEE
AV Mean grad: 21.5 mmHg
AV Peak grad: 36.4 mmHg
Ao pk vel: 3.02 m/s

## 2022-06-28 MED ORDER — CARVEDILOL 6.25 MG PO TABS: 6.2500 mg | ORAL_TABLET | Freq: Two times a day (BID) | ORAL | 2 refills | Status: DC

## 2022-06-28 NOTE — Progress Notes (Signed)
Primary Physician/Referring:  [name], MD  [name], female    DOB: December 07, 1932, 89 y.o.   MRN: [id number]  Chief Complaint  Patient presents with   Transitions Of Care   Congestive Heart Failure   HPI:    Lisa Mcclure  is a 89 y.o.  female  with  obesity, chronic diastolic CHF, hypertensive heart disease and chronic 3A-B kidney disease, paroxysmal atrial fibrillation,  anemia secondary to hemorrhoids and diverticulosis, esophageal reflux, depression, OSA intolerant to CPAP on nocturnal 02, rheumatoid arthritis, and venous insufficiency, aortic and coronary calcification, s/p AAA repair on 12/20/2019 being followed by vascular surgery.    Patient admitted on 06/19/2022 and discharged 5 days later with acute pulm edema, acute on chronic diastolic heart failure, diuresed close to 6 to 7 L of fluid and developed acute renal failure and discharged home on low-dose Entresto and also on Farxiga.  Patient states that she is maintaining weight loss, she has not had any further leg edema, but has noticed that her blood pressure is trending up.  Cough is subsided and wheezing is also improved.  Past Medical History:  Diagnosis Date   Acute renal failure superimposed on stage 3b chronic kidney disease ([place]) 06/15/2021   Anemia    h/o hemorrhoidal bleeding and blood transfusion   Chronic diastolic CHF (congestive heart failure) (HCC)    Depression    Diverticulosis    DOE (dyspnea on exertion)    Esophageal reflux    GERD (gastroesophageal reflux disease)    Hypothyroidism    Insomnia    LBP (low back pain)    OAB (overactive bladder)    Obesity    OSA (obstructive sleep apnea)    Osteoarthritis    Paroxysmal atrial fibrillation (HCC)    Rheumatoid arthritis(714.0)    Shoulder pain, bilateral    Unspecified essential hypertension    Venous insufficiency    Social History   Tobacco Use   Smoking status: Former    Packs/day: 0.20    Years: 10.00     Additional pack years: 0.00    Total pack years: 2.00    Types: Cigarettes    Quit date: 04/11/1984    Years since quitting: 38.2    Passive exposure: Past   Smokeless tobacco: Never  Substance Use Topics   Alcohol use: Yes    Alcohol/week: 0.0 standard drinks of alcohol    Comment: cocktail once a week   Marital Status: Widowed  ROS  Review of Systems  Constitutional: Negative for weight gain.  Cardiovascular:  Positive for dyspnea on exertion. Negative for chest pain, leg swelling, orthopnea, palpitations and syncope.  Genitourinary:  Negative for bladder incontinence.   Objective  Blood pressure (!) 182/77, pulse (!) 56, resp. rate 18, height 5' 3.5" (1.613 m), weight 204 lb 6.4 oz (92.7 kg), SpO2 93 %.     06/28/2022   11:22 AM 06/24/2022   10:27 AM 06/24/2022    8:20 AM  Vitals with BMI  Height 5' 3.5"    Weight 204 lbs 6 oz    BMI 123XX123    Systolic Q000111Q [id number] [id number]  Diastolic 77 57 51  Pulse 56 68 80     Physical Exam Vitals reviewed.  Constitutional:      Appearance: She is well-developed. She is obese.  Neck:     Vascular: Carotid bruit present. No JVD.  Cardiovascular:     Rate and Rhythm: Normal rate and regular rhythm.  Pulses: Intact distal pulses.     Heart sounds: S1 normal and S2 normal. Murmur heard.     Harsh midsystolic murmur is present with a grade of 3/6 at the upper right sternal border radiating to the apex.     No gallop.  Pulmonary:     Effort: Pulmonary effort is normal. No accessory muscle usage.     Breath sounds: Rales (Bibasilar faint and improved) present.  Abdominal:     General: Bowel sounds are normal.     Palpations: Abdomen is soft.  Musculoskeletal:     Right lower leg: No edema.     Left lower leg: No edema.    Laboratory examination:   Recent Labs    06/22/22 0032 06/23/22 0044 06/24/22 0040  NA 136 135 134*  K 3.6 3.7 4.1  CL 94* 95* 93*  CO2 34* 31 30  GLUCOSE 112* 149* 123*  BUN 21 24* 34*  CREATININE 1.11*  1.06* 1.67*  CALCIUM 8.4* 8.6* 8.5*  GFRNONAA 48* 51* 29*      Latest Ref Rng & Units 06/24/2022   12:40 AM 06/23/2022   12:44 AM 06/22/2022   12:32 AM  CMP  Glucose 70 - 99 mg/dL 123  149  112   BUN 8 - 23 mg/dL 34  24  21   Creatinine 0.44 - 1.00 mg/dL 1.67  1.06  1.11   Sodium 135 - 145 mmol/L 134  135  136   Potassium 3.5 - 5.1 mmol/L 4.1  3.7  3.6   Chloride 98 - 111 mmol/L 93  95  94   CO2 22 - 32 mmol/L 30  31  34   Calcium 8.9 - 10.3 mg/dL 8.5  8.6  8.4       Latest Ref Rng & Units 06/20/2022    2:54 AM 06/19/2022    5:20 PM 04/24/2022   10:27 AM  CBC  WBC 4.0 - 10.5 K/uL 9.8  9.4  10.2   Hemoglobin 12.0 - 15.0 g/dL 13.5  13.8  13.3   Hematocrit 36.0 - 46.0 % 43.4  44.7  43.3   Platelets 150 - 400 K/uL 236  250  296    Lipid Panel    Component Value Date/Time   CHOL 126 03/14/2019 1121   TRIG 82 03/14/2019 1121   HDL 49 03/14/2019 1121   CHOLHDL 5.1 CALC 12/19/2006 0916   VLDL 26 12/19/2006 0916   LDLCALC 61 03/14/2019 1121   LDLDIRECT 159.2 12/19/2006 0916    Lab Results  Component Value Date   TSH 1.195 06/19/2022    External labs:   Allergies   Allergies  Allergen Reactions   Statins Other (See Comments)    Muscle aches and INTERNAL BLEEDING   Atorvastatin Other (See Comments)    Myalgias   Gabapentin Other (See Comments)    "Made me loopy"   Methocarbamol Other (See Comments)    "Made me loopy"    Final Medications at End of Visit    Current Outpatient Medications:    albuterol (PROVENTIL) (2.5 MG/3ML) 0.083% nebulizer solution, Take 3 mLs (2.5 mg total) by nebulization every 6 (six) hours as needed for wheezing or shortness of breath., Disp: 75 mL, Rfl: 12   ALEVE ARTHRITIS PAIN 1 % GEL, Apply 2 g topically 4 (four) times daily as needed (for pain)., Disp: , Rfl:    amiodarone (PACERONE) 100 MG tablet, Take 1 tablet (100 mg total) by mouth every other day., Disp: 60  tablet, Rfl: 5   apixaban (ELIQUIS) 2.5 MG TABS tablet, Take 1 tablet (2.5 mg  total) by mouth 2 (two) times daily. (Patient taking differently: Take 2.5 mg by mouth in the morning and at bedtime.), Disp: 180 tablet, Rfl: 3   budesonide (PULMICORT) 0.5 MG/2ML nebulizer solution, Take 2 mLs (0.5 mg total) by nebulization in the morning and at bedtime. (Patient taking differently: Take 0.5 mg by nebulization daily.), Disp: 120 mL, Rfl: 1   busPIRone (BUSPAR) 7.5 MG tablet, Take 7.5 mg by mouth See admin instructions. Take 7.5 mg by mouth at bedtime and an additional 7.5 mg once a day as needed for anxiety, Disp: , Rfl:    dapagliflozin propanediol (FARXIGA) 10 MG TABS tablet, Take 1 tablet (10 mg total) by mouth daily before breakfast., Disp: 30 tablet, Rfl: 6   diclofenac sodium (VOLTAREN) 1 % GEL, Apply 2.25 g topically 3 (three) times daily as needed (for pain)., Disp: , Rfl:    docusate sodium (COLACE) 100 MG capsule, Take 200 mg by mouth at bedtime., Disp: , Rfl:    furosemide (LASIX) 20 MG tablet, Take 1 tablet (20 mg total) by mouth daily as needed. For weight gain of >2 Lbs, Disp: 30 tablet, Rfl: 0   HYDROcodone-acetaminophen (NORCO) 7.5-325 MG tablet, Take 1 tablet by mouth every 6 (six) hours as needed for moderate pain., Disp: 20 tablet, Rfl: 0   ipratropium (ATROVENT) 0.03 % nasal spray, Place 2 sprays into both nostrils every 12 (twelve) hours. (Patient taking differently: Place 2 sprays into both nostrils every 12 (twelve) hours as needed for rhinitis.), Disp: 30 mL, Rfl: 1   latanoprost (XALATAN) 0.005 % ophthalmic solution, Place 1 drop into both eyes at bedtime., Disp: , Rfl:    levothyroxine (SYNTHROID) 125 MCG tablet, Take 125 mcg by mouth daily before breakfast. , Disp: , Rfl:    melatonin 3 MG TABS tablet, Take 3 mg by mouth at bedtime., Disp: , Rfl:    Multiple Vitamins-Minerals (PRESERVISION AREDS 2+MULTI VIT) CAPS, Take 1 capsule by mouth in the morning and at bedtime., Disp: , Rfl:    rOPINIRole (REQUIP) 1 MG tablet, Take 1 mg by mouth See admin  instructions. Take 1 mg by mouth at 1 PM and 2 mg at 9 PM, Disp: , Rfl: 12   sacubitril-valsartan (ENTRESTO) 24-26 MG, Take 1 tablet by mouth 2 (two) times daily., Disp: , Rfl: 0   SYSTANE COMPLETE PF 0.6 % SOLN, Place 1 drop into both eyes 4 (four) times daily as needed (for dryness)., Disp: , Rfl:    carvedilol (COREG) 6.25 MG tablet, Take 1 tablet (6.25 mg total) by mouth 2 (two) times daily with a meal., Disp: 60 tablet, Rfl: 2    Radiology:   CT Abdomen and Pelvis W Contrast 12/20/2019: 1. Large Infrarenal Abdominal Aortic Aneurysm measuring up to 5.7 cm with mild perianeurysmal inflammation. Differential considerations include impending AAA rupture, mycotic AAA, symptomatic AAA. Recommend urgent transfer to emergency department and Vascular Surgery consultation. Aortic aneurysm NOS (ICD10-I71.9). Aortic Atherosclerosis (ICD10-I70.0). 2. No other acute or inflammatory process identified. 5 cm fat containing left pelvic hernia. Large bowel diverticulosis. 3. Simple appearing 4 cm left ovarian cyst, recommend follow-up Ultrasound in 6-12 months  Cardiac Studies:   Lexiscan myoview stress test 12/08/2017: 1. Lexiscan stress test was performed. Exercise capacity was not assessed. Stress symptoms included abdominal pain. Blood pressure was normal. The resting and stress electrocardiogram demonstrated normal sinus rhythm, normal resting conduction, no resting arrhythmias  and normal rest repolarization. 2. The overall quality of the study is good. There is no evidence of abnormal lung activity. Stress and rest SPECT images demonstrate homogeneous tracer distribution throughout the myocardium. Gated SPECT imaging reveals normal myocardial thickening and wall motion. The left ventricular ejection fraction was normal (54%). 3. Low risk study  Echocardiogram 06/20/2022:  1. Left ventricular ejection fraction, by estimation, is 60 to 65%. The left ventricle has normal function. The left ventricle has  no regional wall motion abnormalities. There is moderate concentric left ventricular hypertrophy. Left ventricular  diastolic parameters are consistent with Grade I diastolic dysfunction (impaired relaxation). Elevated left atrial pressure.  2. Right ventricular systolic function is normal. The right ventricular size is normal.  3. Left atrial size was moderately dilated.  4. The mitral valve is normal in structure. No evidence of mitral valve regurgitation. No evidence of mitral stenosis.  5. The aortic valve is severely calcified with restricted leaflet excursion. Visually the valve appears severely stenotic. The aortic valve mean gradient is 23mmHg with a peak gradient of 58mmHg. AVA by VTI of 0.56cm2. Aortic valve mean gradient  measures 18.0 mmHg. Aortic valve peak gradient measures 30.9 mmHg. Aortic valve area, by VTI measures 0.84 cm. DI 0.33.  Compared to 02/08/1922, aortic valve mean gradient was 23 mmHg.  6. The inferior vena cava is dilated in size with >50% respiratory variability, suggesting right atrial pressure of 8 mmHg.  TEE 06/23/2022: AV: thick and sclerotic valve. Moderate AS. Peak velocity 3 m/s. Surface probe used: Vmax 3.03 m/s. Vmean 2.21 m/s. Max peak gradient 37 mmHg. Mean gradient 22 mmHg. VTI 71.7   Renal artery duplex  06/21/2022: Right: 1-59% stenosis of the right renal artery. Abnormal right  Resistive Index. Normal size right kidney.  Left:  1-59% stenosis of the left renal artery. Abnormal left Resisitve Index. Normal size of left kidney.  Right kidney length (cm)10.12, left kidney length (cm)10.07   EKG   EKG 05/04/2022: Sinus rhythm with first-degree block at the rate of 60 bpm, left atrial enlargement, poor R progression, probably normal variant.  No evidence of ischemia, normal QT interval.  Compared to 04/20/2022, atrial fibrillation with rapid ventricular response no longer present.  Compared to 10/07/2021, sinus rhythm has been replaced.  Assessment      ICD-10-CM   1. Acute on chronic diastolic heart failure (HCC)  I50.33 EKG 12-Lead    carvedilol (COREG) 6.25 MG tablet    Basic metabolic panel    Pro b natriuretic peptide (BNP)    2. Stage 3b chronic kidney disease (HCC)  N18.32     3. Primary hypertension  I10     4. Moderate aortic stenosis  I35.0      This patients CHA2DS2-VASc Score 6 (CHF, HTN, vasc, age, F) and yearly risk of stroke 9.8%.  Meds ordered this encounter  Medications   carvedilol (COREG) 6.25 MG tablet    Sig: Take 1 tablet (6.25 mg total) by mouth 2 (two) times daily with a meal.    Dispense:  60 tablet    Refill:  2   Medications Discontinued During This Encounter  Medication Reason   albuterol (VENTOLIN HFA) 108 (90 Base) MCG/ACT inhaler    neomycin-polymyxin-dexameth (MAXITROL) 0.1 % OINT    carvedilol (COREG) 3.125 MG tablet Reorder   Recommendations:   Lisa Mcclure  is a 89 y.o.female  with  obesity, chronic diastolic CHF, hypertensive heart disease and chronic 3A-B kidney disease, paroxysmal atrial fibrillation,  anemia secondary to hemorrhoids and diverticulosis, esophageal reflux, depression, OSA intolerant to CPAP on nocturnal 02, rheumatoid arthritis, and venous insufficiency, aortic and coronary calcification, s/p AAA repair on 12/20/2019 being followed by vascular surgery.    Patient presents to the office post hospital follow-up of heart failure (1/12 through 04/25/2022), again admitted on 06/19/2022 and discharged 5 days later with acute pulm edema, acute on chronic diastolic heart failure, diuresed close to 6 to 7 L of fluid and developed acute renal failure and discharged home on low-dose Entresto and also on Farxiga. She diuresed about 6 L prior to discharge, discharge weight 88.2 kg (194 LBS).  1. Acute on chronic diastolic heart failure ([place]) Patient is presently doing well and has maintained weight loss.  Her blood pressure has started to trend upwards which may suggest early signs of  heart failure as well.  Advised her to completely avoid salty food and also do not use any salt at all for at least 6 to 8 weeks.  Seasoning is fine.  I would like to obtain NT proBNP.  For hypertension and also for heart failure, I will increase the dose of the carvedilol to 6.25 mg twice daily.    06/27/22 4:06 PM                     151      /           79        mmHg  71        bpm      06/27/22 1:26 PM                     174      /           95        mmHg  67        bpm      06/25/22 1:23 PM                     131      /           81        mmHg  72        bpm   Discharge weight is 199 lb from this hospitalization (last hospitalization discharge weight was ~204-206 lb)   Prior to hospitalization  Weight lb         --   204.8 (202.2 - 211.4)    Post hospitalization  Weight lb -- 193.7 (191.8 - 198.4)     06/28/22 8:08 AM         194.0   lb                      06/28/22 8:07 AM         194.0   lb                      06/28/22 8:07 AM         194.0   lb                      06/27/22 8:10 AM         191.8   lb                      06/26/22 5:48 AM  194.0   lb                      06/25/22 7:38 AM         191.8   lb                      06/25/22 7:37 AM         191.8   lb                      06/24/22 3:45 PM         198.4   lb                      06/19/22 6:51 AM         204.2   lb                      06/18/22 8:19 AM           208.0   Lb    2. Stage 3b chronic kidney disease ([place]) Patient needs follow-up of renal function, BMP has been ordered for today.  3. Primary hypertension As dictated above, blood pressure is elevated, will increase carvedilol dose to 6.25 mg p.o. twice daily.  4. Moderate aortic stenosis Aortic stenosis is only moderate both by echocardiogram and also by TEE.  Do not think it is contributing to her heart failure symptoms.  We will continue to do remote monitoring for both hypertension and for heart failure, office visit in 10 days to 2 weeks.    Adrian Prows, MD, [facility] 06/28/2022, 11:46 AM Office: [phone] Fax: [phone] Pager: [phone]

## 2022-06-28 NOTE — Telephone Encounter (Signed)
Patient only has 2 blood pressure readings since hospital discharge. Patient weight is lower than prior to hospitalization.  06/27/22 4:06 PM  151 / 79 mmHg 71 bpm  06/27/22 1:26 PM  174 / 95 mmHg 67 bpm  06/25/22 1:23 PM  131 / 81 mmHg 72 bpm  Discharge weight is 199 lb from this hospitalization (last hospitalization discharge weight was ~204-206 lb)  Prior to hospitalization  Weight lb         --   204.8 (202.2 - 211.4)   Post hospitalization  Weight lb -- 193.7 (191.8 - 198.4)   06/28/22 8:08 AM 194.0 lb    06/28/22 8:07 AM 194.0 lb    06/28/22 8:07 AM 194.0 lb    06/27/22 8:10 AM 191.8 lb    06/26/22 5:48 AM 194.0 lb    06/25/22 7:38 AM 191.8 lb    06/25/22 7:37 AM 191.8 lb    06/24/22 3:45 PM 198.4 lb    06/19/22 6:51 AM 204.2 lb    06/18/22 8:19 AM 208.0 Lb

## 2022-06-29 DIAGNOSIS — I700 Atherosclerosis of aorta: Secondary | ICD-10-CM | POA: Diagnosis not present

## 2022-06-29 DIAGNOSIS — D6869 Other thrombophilia: Secondary | ICD-10-CM | POA: Diagnosis not present

## 2022-06-29 DIAGNOSIS — I872 Venous insufficiency (chronic) (peripheral): Secondary | ICD-10-CM | POA: Diagnosis not present

## 2022-06-29 DIAGNOSIS — M069 Rheumatoid arthritis, unspecified: Secondary | ICD-10-CM | POA: Diagnosis not present

## 2022-06-29 DIAGNOSIS — I493 Ventricular premature depolarization: Secondary | ICD-10-CM | POA: Diagnosis not present

## 2022-06-29 DIAGNOSIS — I4821 Permanent atrial fibrillation: Secondary | ICD-10-CM | POA: Diagnosis not present

## 2022-06-29 DIAGNOSIS — E785 Hyperlipidemia, unspecified: Secondary | ICD-10-CM | POA: Diagnosis not present

## 2022-06-29 DIAGNOSIS — I083 Combined rheumatic disorders of mitral, aortic and tricuspid valves: Secondary | ICD-10-CM | POA: Diagnosis not present

## 2022-06-29 DIAGNOSIS — J9602 Acute respiratory failure with hypercapnia: Secondary | ICD-10-CM | POA: Diagnosis not present

## 2022-06-29 DIAGNOSIS — G4733 Obstructive sleep apnea (adult) (pediatric): Secondary | ICD-10-CM | POA: Diagnosis not present

## 2022-06-29 DIAGNOSIS — E039 Hypothyroidism, unspecified: Secondary | ICD-10-CM | POA: Diagnosis not present

## 2022-06-29 DIAGNOSIS — J9621 Acute and chronic respiratory failure with hypoxia: Secondary | ICD-10-CM | POA: Diagnosis not present

## 2022-06-29 DIAGNOSIS — D509 Iron deficiency anemia, unspecified: Secondary | ICD-10-CM | POA: Diagnosis not present

## 2022-06-29 DIAGNOSIS — I5033 Acute on chronic diastolic (congestive) heart failure: Secondary | ICD-10-CM | POA: Diagnosis not present

## 2022-06-29 DIAGNOSIS — I130 Hypertensive heart and chronic kidney disease with heart failure and stage 1 through stage 4 chronic kidney disease, or unspecified chronic kidney disease: Secondary | ICD-10-CM | POA: Diagnosis not present

## 2022-06-29 DIAGNOSIS — G4700 Insomnia, unspecified: Secondary | ICD-10-CM | POA: Diagnosis not present

## 2022-06-29 DIAGNOSIS — I471 Supraventricular tachycardia, unspecified: Secondary | ICD-10-CM | POA: Diagnosis not present

## 2022-06-29 DIAGNOSIS — J449 Chronic obstructive pulmonary disease, unspecified: Secondary | ICD-10-CM | POA: Diagnosis not present

## 2022-06-29 DIAGNOSIS — M19011 Primary osteoarthritis, right shoulder: Secondary | ICD-10-CM | POA: Diagnosis not present

## 2022-06-29 DIAGNOSIS — F419 Anxiety disorder, unspecified: Secondary | ICD-10-CM | POA: Diagnosis not present

## 2022-06-29 DIAGNOSIS — D631 Anemia in chronic kidney disease: Secondary | ICD-10-CM | POA: Diagnosis not present

## 2022-06-29 DIAGNOSIS — N1832 Chronic kidney disease, stage 3b: Secondary | ICD-10-CM | POA: Diagnosis not present

## 2022-06-29 DIAGNOSIS — M19012 Primary osteoarthritis, left shoulder: Secondary | ICD-10-CM | POA: Diagnosis not present

## 2022-06-29 DIAGNOSIS — F32A Depression, unspecified: Secondary | ICD-10-CM | POA: Diagnosis not present

## 2022-06-29 DIAGNOSIS — I2510 Atherosclerotic heart disease of native coronary artery without angina pectoris: Secondary | ICD-10-CM | POA: Diagnosis not present

## 2022-06-29 LAB — BASIC METABOLIC PANEL
BUN/Creatinine Ratio: 33 — ABNORMAL HIGH (ref 12–28)
BUN: 36 mg/dL — ABNORMAL HIGH (ref 8–27)
CO2: 28 mmol/L (ref 20–29)
Calcium: 8.9 mg/dL (ref 8.7–10.3)
Chloride: 98 mmol/L (ref 96–106)
Creatinine, Ser: 1.08 mg/dL — ABNORMAL HIGH (ref 0.57–1.00)
Glucose: 84 mg/dL (ref 70–99)
Potassium: 4.9 mmol/L (ref 3.5–5.2)
Sodium: 140 mmol/L (ref 134–144)
eGFR: 50 mL/min/{1.73_m2} — ABNORMAL LOW (ref >59)

## 2022-06-29 LAB — PRO B NATRIURETIC PEPTIDE: NT-Pro BNP: 583 pg/mL (ref 0–738)

## 2022-06-30 DIAGNOSIS — D631 Anemia in chronic kidney disease: Secondary | ICD-10-CM | POA: Diagnosis not present

## 2022-06-30 DIAGNOSIS — I5033 Acute on chronic diastolic (congestive) heart failure: Secondary | ICD-10-CM | POA: Diagnosis not present

## 2022-06-30 DIAGNOSIS — J9602 Acute respiratory failure with hypercapnia: Secondary | ICD-10-CM | POA: Diagnosis not present

## 2022-06-30 DIAGNOSIS — N1832 Chronic kidney disease, stage 3b: Secondary | ICD-10-CM | POA: Diagnosis not present

## 2022-06-30 DIAGNOSIS — I130 Hypertensive heart and chronic kidney disease with heart failure and stage 1 through stage 4 chronic kidney disease, or unspecified chronic kidney disease: Secondary | ICD-10-CM | POA: Diagnosis not present

## 2022-06-30 DIAGNOSIS — J9621 Acute and chronic respiratory failure with hypoxia: Secondary | ICD-10-CM | POA: Diagnosis not present

## 2022-07-01 ENCOUNTER — Ambulatory Visit: Payer: Self-pay

## 2022-07-01 NOTE — Patient Outreach (Signed)
  Care Coordination   Follow Up Visit Note   07/01/2022 Name: Lisa Mcclure MRN: [id number] DOB: 1932/10/15  Lisa Mcclure is a 89 y.o. year old female who sees [name], MD for primary care. I spoke with  Lisa Mcclure by phone today.  What matters to the patients health and wellness today?  Patient will follow a no salt diet as directed by Cardiology.     Goals Addressed               This Visit's Progress     Patient Stated     I have started a new medication for my heart failure (pt-stated)        Care Coordination Interventions: Basic overview and discussion of pathophysiology of Heart Failure reviewed Provided education on low sodium diet Reviewed Heart Failure Action Plan in depth and provided written copy Discussed importance of daily weight and advised patient to weigh and record daily Reviewed role of diuretics in prevention of fluid overload and management of heart failure; Discussed the importance of keeping all appointments with provider Advised patient to discuss new symptoms or concerns with provider       Interventions Today    Flowsheet Row Most Recent Value  Chronic Disease   Chronic disease during today's visit Congestive Heart Failure (CHF), Hypertension (HTN)  General Interventions   General Interventions Discussed/Reviewed General Interventions Discussed, Doctor Visits, Durable Medical Equipment (DME)  Doctor Visits Discussed/Reviewed Doctor Visits Discussed, PCP, Specialist  Durable Medical Equipment (DME) Oxygen  Exercise Interventions   Exercise Discussed/Reviewed Exercise Discussed  Education Interventions   Education Provided Provided Education  Provided Verbal Education On Nutrition, Labs, When to see the doctor  Nutrition Interventions   Nutrition Discussed/Reviewed Nutrition Discussed, Nutrition Reviewed, Decreasing salt  Pharmacy Interventions   Pharmacy Dicussed/Reviewed Medications and their functions, Pharmacy  Topics Discussed          SDOH assessments and interventions completed:  No     Care Coordination Interventions:  Yes, provided   Follow up plan: Follow up call scheduled for 07/06/22 @12  PM     Encounter Outcome:  Pt. Visit Completed

## 2022-07-01 NOTE — Patient Instructions (Signed)
Visit Information  Thank you for taking time to visit with me today. Please don't hesitate to contact me if I can be of assistance to you.   Following are the goals we discussed today:   Goals Addressed               This Visit's Progress     Patient Stated     I have started a new medication for my heart failure (pt-stated)        Care Coordination Interventions: Basic overview and discussion of pathophysiology of Heart Failure reviewed Provided education on low sodium diet Reviewed Heart Failure Action Plan in depth and provided written copy Discussed importance of daily weight and advised patient to weigh and record daily Reviewed role of diuretics in prevention of fluid overload and management of heart failure; Discussed the importance of keeping all appointments with provider Advised patient to discuss new symptoms or concerns with provider           Our next appointment is by telephone on 07/06/22 at 12 PM  Please call the care guide team at [phone] if you need to cancel or reschedule your appointment.   If you are experiencing a Mental Health or [place] or need someone to talk to, please call 1-800-273-TALK (toll free, 24 hour hotline) go to [facility] [address] [place] [phone])  Patient verbalizes understanding of instructions and care plan provided today and agrees to view in [place]. Active MyChart status and patient understanding of how to access instructions and care plan via MyChart confirmed with patient.     Barb Merino, RN, BSN, CCM Care Management Coordinator [facility] Care Management  Direct Phone: [phone]

## 2022-07-04 DIAGNOSIS — N1832 Chronic kidney disease, stage 3b: Secondary | ICD-10-CM | POA: Diagnosis not present

## 2022-07-04 DIAGNOSIS — J9621 Acute and chronic respiratory failure with hypoxia: Secondary | ICD-10-CM | POA: Diagnosis not present

## 2022-07-04 DIAGNOSIS — J9602 Acute respiratory failure with hypercapnia: Secondary | ICD-10-CM | POA: Diagnosis not present

## 2022-07-04 DIAGNOSIS — I5033 Acute on chronic diastolic (congestive) heart failure: Secondary | ICD-10-CM | POA: Diagnosis not present

## 2022-07-04 DIAGNOSIS — D631 Anemia in chronic kidney disease: Secondary | ICD-10-CM | POA: Diagnosis not present

## 2022-07-04 DIAGNOSIS — I130 Hypertensive heart and chronic kidney disease with heart failure and stage 1 through stage 4 chronic kidney disease, or unspecified chronic kidney disease: Secondary | ICD-10-CM | POA: Diagnosis not present

## 2022-07-06 ENCOUNTER — Ambulatory Visit: Payer: Self-pay

## 2022-07-06 DIAGNOSIS — H43813 Vitreous degeneration, bilateral: Secondary | ICD-10-CM | POA: Diagnosis not present

## 2022-07-06 DIAGNOSIS — H353114 Nonexudative age-related macular degeneration, right eye, advanced atrophic with subfoveal involvement: Secondary | ICD-10-CM | POA: Diagnosis not present

## 2022-07-06 DIAGNOSIS — H353221 Exudative age-related macular degeneration, left eye, with active choroidal neovascularization: Secondary | ICD-10-CM | POA: Diagnosis not present

## 2022-07-06 DIAGNOSIS — H353123 Nonexudative age-related macular degeneration, left eye, advanced atrophic without subfoveal involvement: Secondary | ICD-10-CM | POA: Diagnosis not present

## 2022-07-06 DIAGNOSIS — H35033 Hypertensive retinopathy, bilateral: Secondary | ICD-10-CM | POA: Diagnosis not present

## 2022-07-06 DIAGNOSIS — H43393 Other vitreous opacities, bilateral: Secondary | ICD-10-CM | POA: Diagnosis not present

## 2022-07-06 NOTE — Patient Outreach (Signed)
  Care Coordination   Follow Up Visit Note   07/06/2022 Name: Lisa Mcclure MRN: [id number] DOB: [birth year]  Lisa Mcclure is a 89 y.o. year old female who sees Lisa Lopes, MD for primary care. I spoke with  Lisa Mcclure by phone today.  What matters to the patients health and wellness today?  Patient would like to have her macular degeneration evaluated and treated.     Goals Addressed             This Visit's Progress    To have my Macular Degenertion checked       Care Coordination Interventions: Evaluation of current treatment plan related to Macular Degeneration  and patient's adherence to plan as established by provider Determined patient has experienced a drastic change with her vision over the past couple of weeks Discussed patient is unable to read small print including medication labels Discussed patient has an appointment this afternoon to see her Retina Specialist and she confirms having transportation to this appointment     Interventions Today    Flowsheet Row Most Recent Value  Chronic Disease   Chronic disease during today's visit Other, Congestive Heart Failure (CHF)  [macular degeneration]  General Interventions   General Interventions Discussed/Reviewed General Interventions Discussed, General Interventions Reviewed, Annual Eye Exam, Doctor Visits  Doctor Visits Discussed/Reviewed Specialist, Doctor Visits Discussed  Lisa Mcclure Specialist]  Durable Medical Equipment (DME) Oxygen  [place] Fillers scale]  Education Interventions   Education Provided Provided Education  Provided Verbal Education On Nutrition, Eye Care, When to see the doctor, Medication  Nutrition Interventions   Nutrition Discussed/Reviewed Nutrition Discussed, Fluid intake, Nutrition Reviewed, Decreasing salt         SDOH assessments and interventions completed:  Yes     Care Coordination Interventions:  Yes, provided   Follow up plan: Follow up call scheduled for 07/14/22  @12  PM    Encounter Outcome:  Pt. Visit Completed

## 2022-07-06 NOTE — Patient Instructions (Signed)
Visit Information  Thank you for taking time to visit with me today. Please don't hesitate to contact me if I can be of assistance to you.   Following are the goals we discussed today:   Goals Addressed             This Visit's Progress    To have my Macular Degenertion checked       Care Coordination Interventions: Evaluation of current treatment plan related to Macular Degeneration  and patient's adherence to plan as established by provider Determined patient has experienced a drastic change with her vision over the past couple of weeks Discussed patient is unable to read small print including medication labels Discussed patient has an appointment this afternoon to see her Retina Specialist and she confirms having transportation to this appointment         Our next appointment is by telephone on 07/14/22 at 12 PM  Please call the care guide team at [phone] if you need to cancel or reschedule your appointment.   If you are experiencing a Mental Health or [place] or need someone to talk to, please call 1-800-273-TALK (toll free, 24 hour hotline) go to [facility] [address], [place] [phone])  Patient verbalizes understanding of instructions and care plan provided today and agrees to view in [place]. Active MyChart status and patient understanding of how to access instructions and care plan via MyChart confirmed with patient.     Barb Merino, RN, BSN, CCM Care Management Coordinator [place] [facility]  Direct Phone: [phone]

## 2022-07-07 ENCOUNTER — Other Ambulatory Visit: Payer: Self-pay

## 2022-07-07 DIAGNOSIS — J9621 Acute and chronic respiratory failure with hypoxia: Secondary | ICD-10-CM | POA: Diagnosis not present

## 2022-07-07 DIAGNOSIS — I130 Hypertensive heart and chronic kidney disease with heart failure and stage 1 through stage 4 chronic kidney disease, or unspecified chronic kidney disease: Secondary | ICD-10-CM | POA: Diagnosis not present

## 2022-07-07 DIAGNOSIS — N1832 Chronic kidney disease, stage 3b: Secondary | ICD-10-CM | POA: Diagnosis not present

## 2022-07-07 DIAGNOSIS — D631 Anemia in chronic kidney disease: Secondary | ICD-10-CM | POA: Diagnosis not present

## 2022-07-07 DIAGNOSIS — J9602 Acute respiratory failure with hypercapnia: Secondary | ICD-10-CM | POA: Diagnosis not present

## 2022-07-07 DIAGNOSIS — I5033 Acute on chronic diastolic (congestive) heart failure: Secondary | ICD-10-CM | POA: Diagnosis not present

## 2022-07-11 ENCOUNTER — Ambulatory Visit: Payer: Medicare Other | Admitting: Cardiology

## 2022-07-12 ENCOUNTER — Encounter: Payer: Self-pay | Admitting: Cardiology

## 2022-07-12 ENCOUNTER — Telehealth: Payer: Self-pay

## 2022-07-12 ENCOUNTER — Ambulatory Visit: Payer: Medicare Other | Admitting: Cardiology

## 2022-07-12 VITALS — BP 140/76 | HR 60 | Resp 16 | Ht 63.0 in | Wt 198.2 lb

## 2022-07-12 DIAGNOSIS — J9602 Acute respiratory failure with hypercapnia: Secondary | ICD-10-CM | POA: Diagnosis not present

## 2022-07-12 DIAGNOSIS — I130 Hypertensive heart and chronic kidney disease with heart failure and stage 1 through stage 4 chronic kidney disease, or unspecified chronic kidney disease: Secondary | ICD-10-CM | POA: Diagnosis not present

## 2022-07-12 DIAGNOSIS — I5032 Chronic diastolic (congestive) heart failure: Secondary | ICD-10-CM | POA: Diagnosis not present

## 2022-07-12 DIAGNOSIS — I5033 Acute on chronic diastolic (congestive) heart failure: Secondary | ICD-10-CM | POA: Diagnosis not present

## 2022-07-12 DIAGNOSIS — N1832 Chronic kidney disease, stage 3b: Secondary | ICD-10-CM | POA: Diagnosis not present

## 2022-07-12 DIAGNOSIS — I480 Paroxysmal atrial fibrillation: Secondary | ICD-10-CM

## 2022-07-12 DIAGNOSIS — J9621 Acute and chronic respiratory failure with hypoxia: Secondary | ICD-10-CM | POA: Diagnosis not present

## 2022-07-12 DIAGNOSIS — D631 Anemia in chronic kidney disease: Secondary | ICD-10-CM | POA: Diagnosis not present

## 2022-07-12 NOTE — Progress Notes (Signed)
Primary Physician/Referring:  [name], MD  [name], female    DOB: 05-10-1932, 89 y.o.   MRN: [id number]  Chief Complaint  Patient presents with  . Acute on chronic diastolic heart failure ([place])  . Follow-up   HPI:    Lisa Mcclure  is a 89 y.o.  female  with  obesity, chronic diastolic CHF, hypertensive heart disease and chronic 3A-B kidney disease, paroxysmal atrial fibrillation,  anemia secondary to hemorrhoids and diverticulosis, esophageal reflux, depression, OSA intolerant to CPAP on nocturnal 02, rheumatoid arthritis, and venous insufficiency, aortic and coronary calcification, s/p AAA repair on 12/20/2019 being followed by vascular surgery.    Patient admitted on 06/19/2022 and discharged 5 days later with acute pulm edema, acute on chronic diastolic heart failure, diuresed close to 6 to 7 L of fluid and developed acute renal failure and discharged home on low-dose Entresto and also on Farxiga.  Patient states that she is maintaining weight loss, she has not had any further leg edema, but has noticed that her blood pressure is trending up.  Cough is subsided and wheezing is also improved.  Past Medical History:  Diagnosis Date  . Acute renal failure superimposed on stage 3b chronic kidney disease 06/15/2021  . Anemia    h/o hemorrhoidal bleeding and blood transfusion  . Chronic diastolic CHF (congestive heart failure)   . Depression   . Diverticulosis   . DOE (dyspnea on exertion)   . Esophageal reflux   . GERD (gastroesophageal reflux disease)   . Hypothyroidism   . Insomnia   . LBP (low back pain)   . OAB (overactive bladder)   . Obesity   . OSA (obstructive sleep apnea)   . Osteoarthritis   . Paroxysmal atrial fibrillation   . Rheumatoid arthritis(714.0)   . Shoulder pain, bilateral   . Unspecified essential hypertension   . Venous insufficiency    Social History   Tobacco Use  . Smoking status: Former    Packs/day: 0.20     Years: 10.00    Additional pack years: 0.00    Total pack years: 2.00    Types: Cigarettes    Quit date: 04/11/1984    Years since quitting: 38.2    Passive exposure: Past  . Smokeless tobacco: Never  Substance Use Topics  . Alcohol use: Yes    Alcohol/week: 0.0 standard drinks of alcohol    Comment: cocktail once a week   Marital Status: Widowed  ROS  Review of Systems  Constitutional: Negative for weight gain.  Cardiovascular:  Positive for dyspnea on exertion. Negative for chest pain, leg swelling, orthopnea, palpitations and syncope.  Genitourinary:  Negative for bladder incontinence.   Objective  Blood pressure (!) 140/76, pulse 60, resp. rate 16, height 5\' 3"  (1.6 m), weight 198 lb 3.2 oz (89.9 kg), SpO2 95 %.     07/12/2022    2:55 PM 07/12/2022    2:49 PM 06/28/2022   11:22 AM  Vitals with BMI  Height  5\' 3"  5' 3.5"  Weight  198 lbs 3 oz 204 lbs 6 oz  BMI  123456 [id number]  Systolic [id number]  Diastolic 76 77 77  Pulse 60 68 56     Physical Exam Vitals reviewed.  Constitutional:      Appearance: She is well-developed. She is obese.  Neck:     Vascular: Carotid bruit present. No JVD.  Cardiovascular:     Rate and Rhythm: Normal rate  and regular rhythm.     Pulses: Intact distal pulses.     Heart sounds: S1 normal and S2 normal. Murmur heard.     Harsh midsystolic murmur is present with a grade of 3/6 at the upper right sternal border radiating to the apex.     No gallop.  Pulmonary:     Effort: Pulmonary effort is normal. No accessory muscle usage.     Breath sounds: Rales (Bibasilar faint and improved) present.  Abdominal:     General: Bowel sounds are normal.     Palpations: Abdomen is soft.  Musculoskeletal:     Right lower leg: No edema.     Left lower leg: No edema.   Laboratory examination:   Recent Labs    06/22/22 0032 06/23/22 0044 06/24/22 0040 06/28/22 1313  NA 136 135 134* 140  K 3.6 3.7 4.1 4.9  CL 94* 95* 93* 98  CO2 34* 31 30 28    GLUCOSE 112* 149* 123* 84  BUN 21 24* 34* 36*  CREATININE 1.11* 1.06* 1.67* 1.08*  CALCIUM 8.4* 8.6* 8.5* 8.9  GFRNONAA 48* 51* 29*  --       Latest Ref Rng & Units 06/28/2022    1:13 PM 06/24/2022   12:40 AM 06/23/2022   12:44 AM  CMP  Glucose 70 - 99 mg/dL 84  123  149   BUN 8 - 27 mg/dL 36  34  24   Creatinine 0.57 - 1.00 mg/dL 1.08  1.67  1.06   Sodium 134 - 144 mmol/L 140  134  135   Potassium 3.5 - 5.2 mmol/L 4.9  4.1  3.7   Chloride 96 - 106 mmol/L 98  93  95   CO2 20 - 29 mmol/L 28  30  31    Calcium 8.7 - 10.3 mg/dL 8.9  8.5  8.6       Latest Ref Rng & Units 06/20/2022    2:54 AM 06/19/2022    5:20 PM 04/24/2022   10:27 AM  CBC  WBC 4.0 - 10.5 K/uL 9.8  9.4  10.2   Hemoglobin 12.0 - 15.0 g/dL 13.5  13.8  13.3   Hematocrit 36.0 - 46.0 % 43.4  44.7  43.3   Platelets 150 - 400 K/uL 236  250  296    Lipid Panel    Component Value Date/Time   CHOL 126 03/14/2019 1121   TRIG 82 03/14/2019 1121   HDL 49 03/14/2019 1121   CHOLHDL 5.1 CALC 12/19/2006 0916   VLDL 26 12/19/2006 0916   LDLCALC 61 03/14/2019 1121   LDLDIRECT 159.2 12/19/2006 0916    Lab Results  Component Value Date   TSH 1.195 06/19/2022    External labs:   Allergies   Allergies  Allergen Reactions  . Statins Other (See Comments)    Muscle aches and INTERNAL BLEEDING  . Atorvastatin Other (See Comments)    Myalgias  . Gabapentin Other (See Comments)    "Made me loopy"  . Methocarbamol Other (See Comments)    "Made me loopy"    Final Medications at End of Visit    Current Outpatient Medications:  .  albuterol (PROVENTIL) (2.5 MG/3ML) 0.083% nebulizer solution, Take 3 mLs (2.5 mg total) by nebulization every 6 (six) hours as needed for wheezing or shortness of breath., Disp: 75 mL, Rfl: 12 .  ALEVE ARTHRITIS PAIN 1 % GEL, Apply 2 g topically 4 (four) times daily as needed (for pain)., Disp: , Rfl:  .  amiodarone (PACERONE) 100 MG tablet, Take 1 tablet (100 mg total) by mouth every other day.,  Disp: 60 tablet, Rfl: 5 .  apixaban (ELIQUIS) 2.5 MG TABS tablet, Take 1 tablet (2.5 mg total) by mouth 2 (two) times daily. (Patient taking differently: Take 2.5 mg by mouth in the morning and at bedtime.), Disp: 180 tablet, Rfl: 3 .  budesonide (PULMICORT) 0.5 MG/2ML nebulizer solution, Take 2 mLs (0.5 mg total) by nebulization in the morning and at bedtime. (Patient taking differently: Take 0.5 mg by nebulization daily.), Disp: 120 mL, Rfl: 1 .  busPIRone (BUSPAR) 7.5 MG tablet, Take 7.5 mg by mouth See admin instructions. Take 7.5 mg by mouth at bedtime and an additional 7.5 mg once a day as needed for anxiety, Disp: , Rfl:  .  carvedilol (COREG) 6.25 MG tablet, Take 1 tablet (6.25 mg total) by mouth 2 (two) times daily with a meal., Disp: 60 tablet, Rfl: 2 .  Cod Liver Oil CAPS, Take 1 capsule by mouth daily., Disp: , Rfl:  .  dapagliflozin propanediol (FARXIGA) 10 MG TABS tablet, Take 1 tablet (10 mg total) by mouth daily before breakfast., Disp: 30 tablet, Rfl: 6 .  diclofenac sodium (VOLTAREN) 1 % GEL, Apply 2.25 g topically 3 (three) times daily as needed (for pain)., Disp: , Rfl:  .  docusate sodium (COLACE) 100 MG capsule, Take 200 mg by mouth at bedtime., Disp: , Rfl:  .  furosemide (LASIX) 20 MG tablet, Take 1 tablet (20 mg total) by mouth daily as needed. For weight gain of >2 Lbs, Disp: 30 tablet, Rfl: 0 .  HYDROcodone-acetaminophen (NORCO) 7.5-325 MG tablet, Take 1 tablet by mouth every 6 (six) hours as needed for moderate pain., Disp: 20 tablet, Rfl: 0 .  ipratropium (ATROVENT) 0.03 % nasal spray, Place 2 sprays into both nostrils every 12 (twelve) hours. (Patient taking differently: Place 2 sprays into both nostrils every 12 (twelve) hours as needed for rhinitis.), Disp: 30 mL, Rfl: 1 .  latanoprost (XALATAN) 0.005 % ophthalmic solution, Place 1 drop into both eyes at bedtime., Disp: , Rfl:  .  levothyroxine (SYNTHROID) 125 MCG tablet, Take 125 mcg by mouth daily before breakfast. ,  Disp: , Rfl:  .  melatonin 3 MG TABS tablet, Take 3 mg by mouth at bedtime., Disp: , Rfl:  .  Multiple Vitamins-Minerals (PRESERVISION AREDS 2+MULTI VIT) CAPS, Take 1 capsule by mouth in the morning and at bedtime., Disp: , Rfl:  .  rOPINIRole (REQUIP) 1 MG tablet, Take 1 mg by mouth See admin instructions. Take 1 mg by mouth at 1 PM and 2 mg at 9 PM, Disp: , Rfl: 12 .  sacubitril-valsartan (ENTRESTO) 24-26 MG, Take 1 tablet by mouth 2 (two) times daily., Disp: , Rfl: 0 .  SYSTANE COMPLETE PF 0.6 % SOLN, Place 1 drop into both eyes 4 (four) times daily as needed (for dryness)., Disp: , Rfl:     Radiology:   CT Abdomen and Pelvis W Contrast 12/20/2019: 1. Large Infrarenal Abdominal Aortic Aneurysm measuring up to 5.7 cm with mild perianeurysmal inflammation. Differential considerations include impending AAA rupture, mycotic AAA, symptomatic AAA. Recommend urgent transfer to emergency department and Vascular Surgery consultation. Aortic aneurysm NOS (ICD10-I71.9). Aortic Atherosclerosis (ICD10-I70.0). 2. No other acute or inflammatory process identified. 5 cm fat containing left pelvic hernia. Large bowel diverticulosis. 3. Simple appearing 4 cm left ovarian cyst, recommend follow-up Ultrasound in 6-12 months  Cardiac Studies:   Lexiscan myoview stress test 12/08/2017: 1.  Lexiscan stress test was performed. Exercise capacity was not assessed. Stress symptoms included abdominal pain. Blood pressure was normal. The resting and stress electrocardiogram demonstrated normal sinus rhythm, normal resting conduction, no resting arrhythmias and normal rest repolarization. 2. The overall quality of the study is good. There is no evidence of abnormal lung activity. Stress and rest SPECT images demonstrate homogeneous tracer distribution throughout the myocardium. Gated SPECT imaging reveals normal myocardial thickening and wall motion. The left ventricular ejection fraction was normal (54%). 3. Low risk  study  Echocardiogram 06/20/2022:  1. Left ventricular ejection fraction, by estimation, is 60 to 65%. The left ventricle has normal function. The left ventricle has no regional wall motion abnormalities. There is moderate concentric left ventricular hypertrophy. Left ventricular  diastolic parameters are consistent with Grade I diastolic dysfunction (impaired relaxation). Elevated left atrial pressure.  2. Right ventricular systolic function is normal. The right ventricular size is normal.  3. Left atrial size was moderately dilated.  4. The mitral valve is normal in structure. No evidence of mitral valve regurgitation. No evidence of mitral stenosis.  5. The aortic valve is severely calcified with restricted leaflet excursion. Visually the valve appears severely stenotic. The aortic valve mean gradient is 2mmHg with a peak gradient of 67mmHg. AVA by VTI of 0.56cm2. Aortic valve mean gradient  measures 18.0 mmHg. Aortic valve peak gradient measures 30.9 mmHg. Aortic valve area, by VTI measures 0.84 cm. DI 0.33.  Compared to 02/08/1922, aortic valve mean gradient was 23 mmHg.  6. The inferior vena cava is dilated in size with >50% respiratory variability, suggesting right atrial pressure of 8 mmHg.  TEE 06/23/2022: AV: thick and sclerotic valve. Moderate AS. Peak velocity 3 m/s. Surface probe used: Vmax 3.03 m/s. Vmean 2.21 m/s. Max peak gradient 37 mmHg. Mean gradient 22 mmHg. VTI 71.7   Renal artery duplex  06/21/2022: Right: 1-59% stenosis of the right renal artery. Abnormal right  Resistive Index. Normal size right kidney.  Left:  1-59% stenosis of the left renal artery. Abnormal left Resisitve Index. Normal size of left kidney.  Right kidney length (cm)10.12, left kidney length (cm)10.07   EKG   EKG 05/04/2022: Sinus rhythm with first-degree block at the rate of 60 bpm, left atrial enlargement, poor R progression, probably normal variant.  No evidence of ischemia, normal QT interval.   Compared to 04/20/2022, atrial fibrillation with rapid ventricular response no longer present.  Compared to 10/07/2021, sinus rhythm has been replaced.  Assessment     ICD-10-CM   1. Chronic diastolic (congestive) heart failure  I50.32     2. Paroxysmal atrial fibrillation  I48.0 CANCELED: EKG 12-Lead     This patients CHA2DS2-VASc Score 6 (CHF, HTN, vasc, age, F) and yearly risk of stroke 9.8%.  No orders of the defined types were placed in this encounter.  There are no discontinued medications.  Recommendations:   JAMARI WALDRON  is a 89 y.o.female  with  obesity, chronic diastolic CHF, hypertensive heart disease and chronic 3A-B kidney disease, paroxysmal atrial fibrillation,  anemia secondary to hemorrhoids and diverticulosis, esophageal reflux, depression, OSA intolerant to CPAP on nocturnal 02, rheumatoid arthritis, and venous insufficiency, aortic and coronary calcification, s/p AAA repair on 12/20/2019 being followed by vascular surgery.    Patient presents to the office post hospital follow-up of heart failure (1/12 through 04/25/2022), again admitted on 06/19/2022 and discharged 5 days later with acute pulm edema, acute on chronic diastolic heart failure, diuresed close to 6 to 7  L of fluid and developed acute renal failure and discharged home on low-dose Entresto and also on Farxiga. She diuresed about 6 L prior to discharge, discharge weight 88.2 kg (194 LBS).  1. Acute on chronic diastolic heart failure ([place]) Patient is presently doing well and has maintained weight loss.  Her blood pressure has started to trend upwards which may suggest early signs of heart failure as well.  Advised her to completely avoid salty food and also do not use any salt at all for at least 6 to 8 weeks.  Seasoning is fine.  I would like to obtain NT proBNP.  For hypertension and also for heart failure, I will increase the dose of the carvedilol to 6.25 mg twice daily.    06/27/22 4:06 PM                      151      /           79        mmHg  71        bpm      06/27/22 1:26 PM                     174      /           95        mmHg  67        bpm      06/25/22 1:23 PM                     131      /           81        mmHg  72        bpm   Discharge weight is 199 lb from this hospitalization (last hospitalization discharge weight was ~204-206 lb)   Prior to hospitalization  Weight lb         --   204.8 (202.2 - 211.4)    Post hospitalization  Weight lb -- 193.7 (191.8 - 198.4)  Note Patient home weight remains stable around last discharge weight (88.2kg or 194 lb). Patient has NOT taken her blood pressure since hospital discharge.   Systolic mmHg  [id number] (123XX123 - 174.0)             Diastolic mmHg           99991111 (79.0 - 95.0)   06/27/22 4:06 PM         151      /           79        71                                 06/27/22 1:26 PM         174      /           95        67                                 06/25/22 1:23 PM         131      /  81        72   14-d average: Weight           lb         --          194.8 (191.8 - 196.2)    2. Stage 3b chronic kidney disease ([place]) Patient needs follow-up of renal function, BMP has been ordered for today.  3. Primary hypertension As dictated above, blood pressure is elevated, will increase carvedilol dose to 6.25 mg p.o. twice daily.  4. Moderate aortic stenosis Aortic stenosis is only moderate both by echocardiogram and also by TEE.  Do not think it is contributing to her heart failure symptoms.  We will continue to do remote monitoring for both hypertension and for heart failure, office visit in 10 days to 2 weeks.    Adrian Prows, MD, [facility] 07/12/2022, 3:38 PM Office: [phone] Fax: [phone] Pager: [phone]

## 2022-07-12 NOTE — Telephone Encounter (Signed)
Patient home weight remains stable around last discharge weight (88.2kg or 194 lb). Patient has NOT taken her blood pressure since hospital discharge.  Systolic mmHg  [id number] (123XX123 - 123XX123)  Diastolic mmHg 99991111 (XX123456 - 95.0)  06/27/22 4:06 PM 151 / 79 71     06/27/22 1:26 PM 174 / 95 67     06/25/22 1:23 PM 131 / 81 72  14-d average: Weight lb -- 194.8 (191.8 - 196.2)

## 2022-07-13 ENCOUNTER — Telehealth: Payer: Self-pay | Admitting: *Deleted

## 2022-07-13 NOTE — Progress Notes (Signed)
  Care Coordination Note  07/13/2022 Name: [name] MRN: [id number] DOB: 10/10/1932  Lisa Mcclure is a 89 y.o. year old female who is a primary care patient of [name], MD and is actively engaged with the care management team. I reached out to [name] by phone today to assist with re-scheduling a follow up visit with the RN Case Manager  Follow up plan: Unsuccessful telephone outreach attempt made. A HIPAA compliant phone message was left for the patient providing contact information and requesting a return call.  Seth Ward  Direct Dial: [phone]

## 2022-07-15 ENCOUNTER — Telehealth: Payer: Self-pay | Admitting: Cardiology

## 2022-07-15 DIAGNOSIS — N1832 Chronic kidney disease, stage 3b: Secondary | ICD-10-CM

## 2022-07-15 DIAGNOSIS — R0609 Other forms of dyspnea: Secondary | ICD-10-CM

## 2022-07-15 DIAGNOSIS — D631 Anemia in chronic kidney disease: Secondary | ICD-10-CM | POA: Diagnosis not present

## 2022-07-15 DIAGNOSIS — I480 Paroxysmal atrial fibrillation: Secondary | ICD-10-CM | POA: Diagnosis not present

## 2022-07-15 DIAGNOSIS — I5032 Chronic diastolic (congestive) heart failure: Secondary | ICD-10-CM

## 2022-07-15 DIAGNOSIS — I130 Hypertensive heart and chronic kidney disease with heart failure and stage 1 through stage 4 chronic kidney disease, or unspecified chronic kidney disease: Secondary | ICD-10-CM | POA: Diagnosis not present

## 2022-07-15 DIAGNOSIS — I5033 Acute on chronic diastolic (congestive) heart failure: Secondary | ICD-10-CM | POA: Diagnosis not present

## 2022-07-15 DIAGNOSIS — J9602 Acute respiratory failure with hypercapnia: Secondary | ICD-10-CM | POA: Diagnosis not present

## 2022-07-15 DIAGNOSIS — J9621 Acute and chronic respiratory failure with hypoxia: Secondary | ICD-10-CM | POA: Diagnosis not present

## 2022-07-15 NOTE — Telephone Encounter (Signed)
ICD-10-CM   1. Chronic heart failure with preserved ejection fraction (HFpEF)  I50.32     2. Stage 3b chronic kidney disease  N18.32     3. Paroxysmal atrial fibrillation  I48.0     4. Dyspnea on exertion  R06.09      I have reviewed the home health plans of care and signed off on the orders that for home health.

## 2022-07-15 NOTE — Telephone Encounter (Signed)
ICD-10-CM   1. Chronic heart failure with preserved ejection fraction (HFpEF)  I50.32     2. Stage 3b chronic kidney disease  N18.32     3. Paroxysmal atrial fibrillation  I48.0     4. Dyspnea on exertion  R06.09      I have reviewed the home health care records, agree with home health plans of care.  Patient extremely ill with multiple medical comorbidity, age, agree with assessment and plan.

## 2022-07-16 DIAGNOSIS — I5032 Chronic diastolic (congestive) heart failure: Secondary | ICD-10-CM | POA: Diagnosis not present

## 2022-07-18 DIAGNOSIS — N1832 Chronic kidney disease, stage 3b: Secondary | ICD-10-CM | POA: Diagnosis not present

## 2022-07-18 DIAGNOSIS — D631 Anemia in chronic kidney disease: Secondary | ICD-10-CM | POA: Diagnosis not present

## 2022-07-18 DIAGNOSIS — J9621 Acute and chronic respiratory failure with hypoxia: Secondary | ICD-10-CM | POA: Diagnosis not present

## 2022-07-18 DIAGNOSIS — I130 Hypertensive heart and chronic kidney disease with heart failure and stage 1 through stage 4 chronic kidney disease, or unspecified chronic kidney disease: Secondary | ICD-10-CM | POA: Diagnosis not present

## 2022-07-18 DIAGNOSIS — I5033 Acute on chronic diastolic (congestive) heart failure: Secondary | ICD-10-CM | POA: Diagnosis not present

## 2022-07-18 DIAGNOSIS — J9602 Acute respiratory failure with hypercapnia: Secondary | ICD-10-CM | POA: Diagnosis not present

## 2022-07-18 NOTE — Progress Notes (Signed)
  Care Coordination Note  07/18/2022 Name: Lisa Mcclure MRN: [id number] DOB: 01-04-1933  Lisa Mcclure is a 89 y.o. year old female who is a primary care patient of Garlan Fillers, MD and is actively engaged with the care management team. I reached out to Lisa Mcclure by phone today to assist with re-scheduling a follow up visit with the RN Case Manager  Follow up plan: Telephone appointment with care management team member scheduled for:08/18/22  [facility] Coordination Care Guide  Direct Dial: [phone]

## 2022-07-20 DIAGNOSIS — D631 Anemia in chronic kidney disease: Secondary | ICD-10-CM | POA: Diagnosis not present

## 2022-07-20 DIAGNOSIS — J9621 Acute and chronic respiratory failure with hypoxia: Secondary | ICD-10-CM | POA: Diagnosis not present

## 2022-07-20 DIAGNOSIS — I130 Hypertensive heart and chronic kidney disease with heart failure and stage 1 through stage 4 chronic kidney disease, or unspecified chronic kidney disease: Secondary | ICD-10-CM | POA: Diagnosis not present

## 2022-07-20 DIAGNOSIS — J9602 Acute respiratory failure with hypercapnia: Secondary | ICD-10-CM | POA: Diagnosis not present

## 2022-07-20 DIAGNOSIS — N1832 Chronic kidney disease, stage 3b: Secondary | ICD-10-CM | POA: Diagnosis not present

## 2022-07-20 DIAGNOSIS — I5033 Acute on chronic diastolic (congestive) heart failure: Secondary | ICD-10-CM | POA: Diagnosis not present

## 2022-07-21 DIAGNOSIS — I5033 Acute on chronic diastolic (congestive) heart failure: Secondary | ICD-10-CM | POA: Diagnosis not present

## 2022-07-21 DIAGNOSIS — N1832 Chronic kidney disease, stage 3b: Secondary | ICD-10-CM | POA: Diagnosis not present

## 2022-07-21 DIAGNOSIS — D631 Anemia in chronic kidney disease: Secondary | ICD-10-CM | POA: Diagnosis not present

## 2022-07-21 DIAGNOSIS — J9621 Acute and chronic respiratory failure with hypoxia: Secondary | ICD-10-CM | POA: Diagnosis not present

## 2022-07-21 DIAGNOSIS — J9602 Acute respiratory failure with hypercapnia: Secondary | ICD-10-CM | POA: Diagnosis not present

## 2022-07-21 DIAGNOSIS — I130 Hypertensive heart and chronic kidney disease with heart failure and stage 1 through stage 4 chronic kidney disease, or unspecified chronic kidney disease: Secondary | ICD-10-CM | POA: Diagnosis not present

## 2022-07-22 ENCOUNTER — Other Ambulatory Visit: Payer: Self-pay | Admitting: *Deleted

## 2022-07-22 DIAGNOSIS — Z9889 Other specified postprocedural states: Secondary | ICD-10-CM

## 2022-07-25 DIAGNOSIS — I5033 Acute on chronic diastolic (congestive) heart failure: Secondary | ICD-10-CM | POA: Diagnosis not present

## 2022-07-25 DIAGNOSIS — D631 Anemia in chronic kidney disease: Secondary | ICD-10-CM | POA: Diagnosis not present

## 2022-07-25 DIAGNOSIS — J9602 Acute respiratory failure with hypercapnia: Secondary | ICD-10-CM | POA: Diagnosis not present

## 2022-07-25 DIAGNOSIS — J9621 Acute and chronic respiratory failure with hypoxia: Secondary | ICD-10-CM | POA: Diagnosis not present

## 2022-07-25 DIAGNOSIS — N1832 Chronic kidney disease, stage 3b: Secondary | ICD-10-CM | POA: Diagnosis not present

## 2022-07-25 DIAGNOSIS — I130 Hypertensive heart and chronic kidney disease with heart failure and stage 1 through stage 4 chronic kidney disease, or unspecified chronic kidney disease: Secondary | ICD-10-CM | POA: Diagnosis not present

## 2022-07-27 DIAGNOSIS — J9602 Acute respiratory failure with hypercapnia: Secondary | ICD-10-CM | POA: Diagnosis not present

## 2022-07-27 DIAGNOSIS — D631 Anemia in chronic kidney disease: Secondary | ICD-10-CM | POA: Diagnosis not present

## 2022-07-27 DIAGNOSIS — I130 Hypertensive heart and chronic kidney disease with heart failure and stage 1 through stage 4 chronic kidney disease, or unspecified chronic kidney disease: Secondary | ICD-10-CM | POA: Diagnosis not present

## 2022-07-27 DIAGNOSIS — N1832 Chronic kidney disease, stage 3b: Secondary | ICD-10-CM | POA: Diagnosis not present

## 2022-07-27 DIAGNOSIS — J9621 Acute and chronic respiratory failure with hypoxia: Secondary | ICD-10-CM | POA: Diagnosis not present

## 2022-07-27 DIAGNOSIS — I5033 Acute on chronic diastolic (congestive) heart failure: Secondary | ICD-10-CM | POA: Diagnosis not present

## 2022-07-28 DIAGNOSIS — H353114 Nonexudative age-related macular degeneration, right eye, advanced atrophic with subfoveal involvement: Secondary | ICD-10-CM | POA: Diagnosis not present

## 2022-07-28 DIAGNOSIS — N1832 Chronic kidney disease, stage 3b: Secondary | ICD-10-CM | POA: Diagnosis not present

## 2022-07-28 DIAGNOSIS — H35033 Hypertensive retinopathy, bilateral: Secondary | ICD-10-CM | POA: Diagnosis not present

## 2022-07-28 DIAGNOSIS — I130 Hypertensive heart and chronic kidney disease with heart failure and stage 1 through stage 4 chronic kidney disease, or unspecified chronic kidney disease: Secondary | ICD-10-CM | POA: Diagnosis not present

## 2022-07-28 DIAGNOSIS — H43393 Other vitreous opacities, bilateral: Secondary | ICD-10-CM | POA: Diagnosis not present

## 2022-07-28 DIAGNOSIS — J9602 Acute respiratory failure with hypercapnia: Secondary | ICD-10-CM | POA: Diagnosis not present

## 2022-07-28 DIAGNOSIS — I5033 Acute on chronic diastolic (congestive) heart failure: Secondary | ICD-10-CM | POA: Diagnosis not present

## 2022-07-28 DIAGNOSIS — H43813 Vitreous degeneration, bilateral: Secondary | ICD-10-CM | POA: Diagnosis not present

## 2022-07-28 DIAGNOSIS — J9621 Acute and chronic respiratory failure with hypoxia: Secondary | ICD-10-CM | POA: Diagnosis not present

## 2022-07-28 DIAGNOSIS — D631 Anemia in chronic kidney disease: Secondary | ICD-10-CM | POA: Diagnosis not present

## 2022-07-28 DIAGNOSIS — H353123 Nonexudative age-related macular degeneration, left eye, advanced atrophic without subfoveal involvement: Secondary | ICD-10-CM | POA: Diagnosis not present

## 2022-07-28 DIAGNOSIS — H353221 Exudative age-related macular degeneration, left eye, with active choroidal neovascularization: Secondary | ICD-10-CM | POA: Diagnosis not present

## 2022-07-29 ENCOUNTER — Telehealth: Payer: Self-pay | Admitting: *Deleted

## 2022-07-29 DIAGNOSIS — I130 Hypertensive heart and chronic kidney disease with heart failure and stage 1 through stage 4 chronic kidney disease, or unspecified chronic kidney disease: Secondary | ICD-10-CM | POA: Diagnosis not present

## 2022-07-29 DIAGNOSIS — I493 Ventricular premature depolarization: Secondary | ICD-10-CM | POA: Diagnosis not present

## 2022-07-29 DIAGNOSIS — I2510 Atherosclerotic heart disease of native coronary artery without angina pectoris: Secondary | ICD-10-CM | POA: Diagnosis not present

## 2022-07-29 DIAGNOSIS — G4733 Obstructive sleep apnea (adult) (pediatric): Secondary | ICD-10-CM | POA: Diagnosis not present

## 2022-07-29 DIAGNOSIS — E039 Hypothyroidism, unspecified: Secondary | ICD-10-CM | POA: Diagnosis not present

## 2022-07-29 DIAGNOSIS — J9602 Acute respiratory failure with hypercapnia: Secondary | ICD-10-CM | POA: Diagnosis not present

## 2022-07-29 DIAGNOSIS — D6869 Other thrombophilia: Secondary | ICD-10-CM | POA: Diagnosis not present

## 2022-07-29 DIAGNOSIS — I471 Supraventricular tachycardia, unspecified: Secondary | ICD-10-CM | POA: Diagnosis not present

## 2022-07-29 DIAGNOSIS — I5033 Acute on chronic diastolic (congestive) heart failure: Secondary | ICD-10-CM | POA: Diagnosis not present

## 2022-07-29 DIAGNOSIS — F419 Anxiety disorder, unspecified: Secondary | ICD-10-CM | POA: Diagnosis not present

## 2022-07-29 DIAGNOSIS — G4700 Insomnia, unspecified: Secondary | ICD-10-CM | POA: Diagnosis not present

## 2022-07-29 DIAGNOSIS — N1832 Chronic kidney disease, stage 3b: Secondary | ICD-10-CM | POA: Diagnosis not present

## 2022-07-29 DIAGNOSIS — J449 Chronic obstructive pulmonary disease, unspecified: Secondary | ICD-10-CM | POA: Diagnosis not present

## 2022-07-29 DIAGNOSIS — I700 Atherosclerosis of aorta: Secondary | ICD-10-CM | POA: Diagnosis not present

## 2022-07-29 DIAGNOSIS — F32A Depression, unspecified: Secondary | ICD-10-CM | POA: Diagnosis not present

## 2022-07-29 DIAGNOSIS — M069 Rheumatoid arthritis, unspecified: Secondary | ICD-10-CM | POA: Diagnosis not present

## 2022-07-29 DIAGNOSIS — I4821 Permanent atrial fibrillation: Secondary | ICD-10-CM | POA: Diagnosis not present

## 2022-07-29 DIAGNOSIS — D631 Anemia in chronic kidney disease: Secondary | ICD-10-CM | POA: Diagnosis not present

## 2022-07-29 DIAGNOSIS — D509 Iron deficiency anemia, unspecified: Secondary | ICD-10-CM | POA: Diagnosis not present

## 2022-07-29 DIAGNOSIS — M19012 Primary osteoarthritis, left shoulder: Secondary | ICD-10-CM | POA: Diagnosis not present

## 2022-07-29 DIAGNOSIS — M19011 Primary osteoarthritis, right shoulder: Secondary | ICD-10-CM | POA: Diagnosis not present

## 2022-07-29 DIAGNOSIS — I872 Venous insufficiency (chronic) (peripheral): Secondary | ICD-10-CM | POA: Diagnosis not present

## 2022-07-29 DIAGNOSIS — E785 Hyperlipidemia, unspecified: Secondary | ICD-10-CM | POA: Diagnosis not present

## 2022-07-29 DIAGNOSIS — I083 Combined rheumatic disorders of mitral, aortic and tricuspid valves: Secondary | ICD-10-CM | POA: Diagnosis not present

## 2022-07-29 DIAGNOSIS — J9621 Acute and chronic respiratory failure with hypoxia: Secondary | ICD-10-CM | POA: Diagnosis not present

## 2022-07-29 NOTE — Telephone Encounter (Signed)
   Lisa Mcclure DOB: December 09, 1932 MRN: [id number]   RIDER WAIVER AND RELEASE OF LIABILITY  For purposes of improving physical access to our facilities, [facility] is pleased to partner with third parties to provide [facility] patients or other authorized individuals the option of convenient, on-demand ground transportation services (the [employer]") through use of the technology service that enables users to request on-demand ground transportation from independent third-party providers.  By opting to use and accept these [employer], I, the undersigned, hereby agree on behalf of myself, and on behalf of any minor child using the Science writer for whom I am the parent or legal guardian, as follows:  Science writer provided to me are provided by independent third-party transportation providers who are not [employer] or employees and who are unaffiliated with [employer]. [facility] is neither a transportation carrier nor a common or public carrier. [facility] has no control over the quality or safety of the transportation that occurs as a result of the [employer]. [facility] cannot guarantee that any third-party transportation provider will complete any arranged transportation service. [facility] makes no representation, warranty, or guarantee regarding the reliability, timeliness, quality, safety, suitability, or availability of any of the Transport Services or that they will be error free. I fully understand that traveling by vehicle involves risks and dangers of serious bodily injury, including permanent disability, paralysis, and death. I agree, on behalf of myself and on behalf of any minor child using the Transport Services for whom I am the parent or legal guardian, that the entire risk arising out of my use of the [employer] remains solely with me, to the maximum extent permitted under applicable law. The [employer] are provided  "as is" and "as available." [place] disclaims all representations and warranties, express, implied or statutory, not expressly set out in these terms, including the implied warranties of merchantability and fitness for a particular purpose. I hereby waive and release [facility], its agents, employees, officers, directors, representatives, insurers, attorneys, assigns, successors, subsidiaries, and affiliates from any and all past, present, or future claims, demands, liabilities, actions, causes of action, or suits of any kind directly or indirectly arising from acceptance and use of the [employer]. I further waive and release [facility] and its affiliates from all present and future liability and responsibility for any injury or death to persons or damages to property caused by or related to the use of the [employer]. I have read this Waiver and Release of Liability, and I understand the terms used in it and their legal significance. This Waiver is freely and voluntarily given with the understanding that my right (as well as the right of any minor child for whom I am the parent or legal guardian using the [employer]) to legal recourse against [facility] in connection with the [employer] is knowingly surrendered in return for use of these services.   I attest that I read the consent document to [name], gave Ms. Griebel the opportunity to ask questions and answered the questions asked (if any). I affirm that [name] then provided consent for she's participation in this program.     Lisa Mcclure

## 2022-08-01 DIAGNOSIS — J9621 Acute and chronic respiratory failure with hypoxia: Secondary | ICD-10-CM | POA: Diagnosis not present

## 2022-08-01 DIAGNOSIS — N1832 Chronic kidney disease, stage 3b: Secondary | ICD-10-CM | POA: Diagnosis not present

## 2022-08-01 DIAGNOSIS — D631 Anemia in chronic kidney disease: Secondary | ICD-10-CM | POA: Diagnosis not present

## 2022-08-01 DIAGNOSIS — J9602 Acute respiratory failure with hypercapnia: Secondary | ICD-10-CM | POA: Diagnosis not present

## 2022-08-01 DIAGNOSIS — I130 Hypertensive heart and chronic kidney disease with heart failure and stage 1 through stage 4 chronic kidney disease, or unspecified chronic kidney disease: Secondary | ICD-10-CM | POA: Diagnosis not present

## 2022-08-01 DIAGNOSIS — I5033 Acute on chronic diastolic (congestive) heart failure: Secondary | ICD-10-CM | POA: Diagnosis not present

## 2022-08-02 ENCOUNTER — Ambulatory Visit (INDEPENDENT_AMBULATORY_CARE_PROVIDER_SITE_OTHER): Payer: Medicare Other | Admitting: Vascular Surgery

## 2022-08-02 ENCOUNTER — Ambulatory Visit (HOSPITAL_COMMUNITY)
Admission: RE | Admit: 2022-08-02 | Discharge: 2022-08-02 | Disposition: A | Discharge status: Home / Self Care | Payer: Medicare Other | Source: Ambulatory Visit | Attending: Vascular Surgery | Admitting: Vascular Surgery

## 2022-08-02 ENCOUNTER — Encounter: Payer: Self-pay | Admitting: Vascular Surgery

## 2022-08-02 VITALS — BP 178/92 | HR 64 | Temp 97.6°F | Resp 14 | Ht 64.0 in | Wt 194.0 lb

## 2022-08-02 DIAGNOSIS — J9602 Acute respiratory failure with hypercapnia: Secondary | ICD-10-CM | POA: Diagnosis not present

## 2022-08-02 DIAGNOSIS — Z8679 Personal history of other diseases of the circulatory system: Secondary | ICD-10-CM | POA: Insufficient documentation

## 2022-08-02 DIAGNOSIS — I7143 Infrarenal abdominal aortic aneurysm, without rupture: Secondary | ICD-10-CM | POA: Diagnosis not present

## 2022-08-02 DIAGNOSIS — Z9889 Other specified postprocedural states: Secondary | ICD-10-CM | POA: Diagnosis not present

## 2022-08-02 DIAGNOSIS — J9621 Acute and chronic respiratory failure with hypoxia: Secondary | ICD-10-CM | POA: Diagnosis not present

## 2022-08-02 DIAGNOSIS — I5033 Acute on chronic diastolic (congestive) heart failure: Secondary | ICD-10-CM | POA: Diagnosis not present

## 2022-08-02 DIAGNOSIS — I130 Hypertensive heart and chronic kidney disease with heart failure and stage 1 through stage 4 chronic kidney disease, or unspecified chronic kidney disease: Secondary | ICD-10-CM | POA: Diagnosis not present

## 2022-08-02 DIAGNOSIS — N1832 Chronic kidney disease, stage 3b: Secondary | ICD-10-CM | POA: Diagnosis not present

## 2022-08-02 DIAGNOSIS — D631 Anemia in chronic kidney disease: Secondary | ICD-10-CM | POA: Diagnosis not present

## 2022-08-02 NOTE — Progress Notes (Signed)
Patient name: Lisa Mcclure MRN: [id number] DOB: 09-22-1932 Sex: female  REASON FOR VISIT:. 6 month follow-up for interval surveillance after EVAR  HPI: Lisa Mcclure is a 89 y.o. female with multiple medical problems as noted below that presents for 6 month interval follow-up after endovascular stent graft for treatment of a symptomatic 5.7 cm abdominal aortic aneurysm.  I initially met her in the ED when she had new onset abdominal pain that we felt was related to her enlarging AAA.  Her aneurysm was repaired on 12/20/2019 with stent graft.  She did get a CTA on 04/14/2020 that showed good seal but she did have evidence of a type II endoleak but otherwise aneurysm has shown no signs of growth and was actually decreasing in size on previous visits.    Last EVAR duplex showed that her aneurysm had increased to 5.7 x 4.6 cm on 01/04/22.  Wanted to see her back with interval 20-month follow-up.  No abdominal pain today.  Having issues with macular degeneration.  Past Medical History:  Diagnosis Date   Acute renal failure superimposed on stage 3b chronic kidney disease 06/15/2021   Anemia    h/o hemorrhoidal bleeding and blood transfusion   Chronic diastolic CHF (congestive heart failure)    Depression    Diverticulosis    DOE (dyspnea on exertion)    Esophageal reflux    GERD (gastroesophageal reflux disease)    Hypothyroidism    Insomnia    LBP (low back pain)    OAB (overactive bladder)    Obesity    OSA (obstructive sleep apnea)    Osteoarthritis    Paroxysmal atrial fibrillation    Rheumatoid arthritis(714.0)    Shoulder pain, bilateral    Unspecified essential hypertension    Venous insufficiency     Past Surgical History:  Procedure Laterality Date   ABDOMINAL AORTIC ENDOVASCULAR STENT GRAFT N/A 12/20/2019   Procedure: Aortogram including catheter selection of aorta and bilateral iliac arteriogram, Endovascular repair of infrarenal abdominal aortic aneurysm with  bifurcated stent graft (26 mm x 14 x 12 main body, right bell bottom with a 20 mm x 10 cm piece, and left bell bottom with a 16 mm x 12 cm piece) ;  Surgeon: Cephus Shelling, MD;  Location: [facility] OR;  Service: Vascular;  Laterality: N/A;   APPENDECTOMY  1953   BIOPSY  06/18/2021   Procedure: BIOPSY;  Surgeon: Kerin Salen, MD;  Location: [facility] ENDOSCOPY;  Service: Gastroenterology;;   bladder abduction-1996  1996   breast biopsy Right 1980   CARDIOVERSION N/A 12/24/2019   Procedure: CARDIOVERSION;  Surgeon: Yates Decamp, MD;  Location[facility] ENDOSCOPY;  Service: Cardiovascular;  Laterality: N/A;   CARDIOVERSION N/A 04/25/2022   Procedure: CARDIOVERSION;  Surgeon: Yates Decamp, MD;  Location: [facility] ENDOSCOPY;  Service: Cardiovascular;  Laterality: N/A;   CESAREAN SECTION     1957, 1961, 1964   COSMETIC SURGERY  1996   CYSTOCELE REPAIR     ESOPHAGOGASTRODUODENOSCOPY (EGD) WITH PROPOFOL N/A 06/18/2021   Procedure: ESOPHAGOGASTRODUODENOSCOPY (EGD) WITH PROPOFOL;  Surgeon: Kerin Salen, MD;  Location: [facility] ENDOSCOPY;  Service: Gastroenterology;  Laterality: N/A;   FLEXIBLE SIGMOIDOSCOPY N/A 04/10/2015   Procedure: FLEXIBLE SIGMOIDOSCOPY;  Surgeon: Willis Modena, MD;  Location: [facility] ENDOSCOPY;  Service: Endoscopy;  Laterality: N/A;   knee arthroscopy Right 1996, 2010   KNEE ARTHROSCOPY W/ AUTOGENOUS CARTILAGE IMPLANTATION (ACI) PROCEDURE Left 1994, 1995   REFRACTIVE SURGERY  01/2020   SHOULDER SURGERY  1990   TEE  WITHOUT CARDIOVERSION N/A 12/24/2019   Procedure: TRANSESOPHAGEAL ECHOCARDIOGRAM (TEE);  Surgeon: Yates Decamp, MD;  Location: [facility] ENDOSCOPY;  Service: Cardiovascular;  Laterality: N/A;   TEE WITHOUT CARDIOVERSION N/A 06/23/2022   Procedure: TRANSESOPHAGEAL ECHOCARDIOGRAM (TEE);  Surgeon: Clotilde Dieter, DO;  Location: [facility] ENDOSCOPY;  Service: Cardiovascular;  Laterality: N/A;   TOTAL ABDOMINAL HYSTERECTOMY  1972   ULTRASOUND GUIDANCE FOR VASCULAR ACCESS Bilateral 12/20/2019   Procedure: Ultrasound-guided  access of bilateral common femoral arteries for delivery of endograft and percutaneous closure;  Surgeon: Cephus Shelling, MD;  Location: [facility] OR;  Service: Vascular;  Laterality: Bilateral;   VESICOVAGINAL FISTULA CLOSURE W/ TAH     WRIST SURGERY  1967    Family History  Problem Relation Age of Onset   Lung cancer Mother    COPD Father    Breast cancer Maternal Aunt    Heart attack Maternal Grandmother 62   Lung cancer Son     SOCIAL HISTORY: Social History   Tobacco Use   Smoking status: Former    Packs/day: 0.20    Years: 10.00    Additional pack years: 0.00    Total pack years: 2.00    Types: Cigarettes    Quit date: 04/11/1984    Years since quitting: 38.3    Passive exposure: Past   Smokeless tobacco: Never  Substance Use Topics   Alcohol use: Yes    Alcohol/week: 0.0 standard drinks of alcohol    Comment: cocktail once a week    Allergies  Allergen Reactions   Statins Other (See Comments)    Muscle aches and INTERNAL BLEEDING   Atorvastatin Other (See Comments)    Myalgias   Gabapentin Other (See Comments)    "Made me loopy"   Methocarbamol Other (See Comments)    "Made me loopy"    Current Outpatient Medications  Medication Sig Dispense Refill   albuterol (PROVENTIL) (2.5 MG/3ML) 0.083% nebulizer solution Take 3 mLs (2.5 mg total) by nebulization every 6 (six) hours as needed for wheezing or shortness of breath. 75 mL 12   ALEVE ARTHRITIS PAIN 1 % GEL Apply 2 g topically 4 (four) times daily as needed (for pain).     amiodarone (PACERONE) 100 MG tablet Take 1 tablet (100 mg total) by mouth every other day. 60 tablet 5   apixaban (ELIQUIS) 2.5 MG TABS tablet Take 1 tablet (2.5 mg total) by mouth 2 (two) times daily. (Patient taking differently: Take 2.5 mg by mouth in the morning and at bedtime.) 180 tablet 3   budesonide (PULMICORT) 0.5 MG/2ML nebulizer solution Take 2 mLs (0.5 mg total) by nebulization in the morning and at bedtime. (Patient taking  differently: Take 0.5 mg by nebulization daily.) 120 mL 1   busPIRone (BUSPAR) 7.5 MG tablet Take 7.5 mg by mouth See admin instructions. Take 7.5 mg by mouth at bedtime and an additional 7.5 mg once a day as needed for anxiety     carvedilol (COREG) 6.25 MG tablet Take 1 tablet (6.25 mg total) by mouth 2 (two) times daily with a meal. 60 tablet 2   Cod Liver Oil CAPS Take 1 capsule by mouth daily.     dapagliflozin propanediol (FARXIGA) 10 MG TABS tablet Take 1 tablet (10 mg total) by mouth daily before breakfast. 30 tablet 6   diclofenac sodium (VOLTAREN) 1 % GEL Apply 2.25 g topically 3 (three) times daily as needed (for pain).     docusate sodium (COLACE) 100 MG capsule Take 200 mg by  mouth at bedtime.     furosemide (LASIX) 20 MG tablet Take 1 tablet (20 mg total) by mouth daily as needed. For weight gain of >2 Lbs 30 tablet 0   HYDROcodone-acetaminophen (NORCO) 7.5-325 MG tablet Take 1 tablet by mouth every 6 (six) hours as needed for moderate pain. 20 tablet 0   ipratropium (ATROVENT) 0.03 % nasal spray Place 2 sprays into both nostrils every 12 (twelve) hours. (Patient taking differently: Place 2 sprays into both nostrils every 12 (twelve) hours as needed for rhinitis.) 30 mL 1   latanoprost (XALATAN) 0.005 % ophthalmic solution Place 1 drop into both eyes at bedtime.     levothyroxine (SYNTHROID) 125 MCG tablet Take 125 mcg by mouth daily before breakfast.      melatonin 3 MG TABS tablet Take 3 mg by mouth at bedtime.     Multiple Vitamins-Minerals (PRESERVISION AREDS 2+MULTI VIT) CAPS Take 1 capsule by mouth in the morning and at bedtime.     rOPINIRole (REQUIP) 1 MG tablet Take 1 mg by mouth See admin instructions. Take 1 mg by mouth at 1 PM and 2 mg at 9 PM  12   sacubitril-valsartan (ENTRESTO) 24-26 MG Take 1 tablet by mouth 2 (two) times daily.  0   SYSTANE COMPLETE PF 0.6 % SOLN Place 1 drop into both eyes 4 (four) times daily as needed (for dryness).     No current  facility-administered medications for this visit.    REVIEW OF SYSTEMS:  [X]  denotes positive finding, [ ]  denotes negative finding Cardiac  Comments:  Chest pain or chest pressure:    Shortness of breath upon exertion:    Short of breath when lying flat:    Irregular heart rhythm:        Vascular    Pain in calf, thigh, or hip brought on by ambulation:    Pain in feet at night that wakes you up from your sleep:     Blood clot in your veins:    Leg swelling:         Pulmonary    Oxygen at home:    Productive cough:     Wheezing:         Neurologic    Sudden weakness in arms or legs:     Sudden numbness in arms or legs:     Sudden onset of difficulty speaking or slurred speech:    Temporary loss of vision in one eye:     Problems with dizziness:         Gastrointestinal    Blood in stool:     Vomited blood:         Genitourinary    Burning when urinating:     Blood in urine:        Psychiatric    Major depression:         Hematologic    Bleeding problems:    Problems with blood clotting too easily:        Skin    Rashes or ulcers:        Constitutional    Fever or chills:      PHYSICAL EXAM: Vitals:   08/02/22 1009  BP: (!) 178/92  Pulse: 64  Resp: 14  Temp: 97.6 F (36.4 C)  TempSrc: Temporal  SpO2: 93%  Weight: 194 lb (88 kg)  Height: 5\' 4"  (1.626 m)    GENERAL: The patient is a well-nourished female, in no acute distress. The vital signs  are documented above. CARDIAC: There is a regular rate and rhythm.  VASCULAR:  Palpable femoral pulses bilaterally ABDOMEN: soft, NT, ND.  DATA:   EVAR duplex today shows 5.9 x 6.1 cm AAA with no endoleak (previous aortic diameter 5.7 x 4.6 cm)  Assessment/Plan:  89 year old female presents for interval 6 month follow-up and ongoing surveillance after endovascular stent graft of her infrarenal abdominal aorta for a 5.7 cm symptomatic AAA on 12/20/2019.  Her initial study showed decreasing size of the  aneurysm sac.  Unfortunately the last several EVAR studies have shown increasing sac size.  Now measuring 6.1 cm with no endoleak today (although previous type II endoleak).  I have recommended CTA abdomen pelvis to evaluate for endoleak and position of stent graft and get a better idea of actual aneurysm size.  I will get the scan ordered and have her back to see me to further discuss.    Cephus Shelling, MD [facility] Office: [phone]

## 2022-08-09 ENCOUNTER — Encounter: Payer: Self-pay | Admitting: Cardiology

## 2022-08-09 ENCOUNTER — Telehealth: Payer: Self-pay

## 2022-08-09 ENCOUNTER — Ambulatory Visit: Payer: Medicare Other | Admitting: Cardiology

## 2022-08-09 VITALS — BP 132/53 | HR 61 | Resp 16 | Ht 64.0 in | Wt 203.0 lb

## 2022-08-09 DIAGNOSIS — I480 Paroxysmal atrial fibrillation: Secondary | ICD-10-CM

## 2022-08-09 DIAGNOSIS — I5033 Acute on chronic diastolic (congestive) heart failure: Secondary | ICD-10-CM

## 2022-08-09 DIAGNOSIS — N1832 Chronic kidney disease, stage 3b: Secondary | ICD-10-CM

## 2022-08-09 NOTE — Telephone Encounter (Signed)
Patient home weight has started to trend up ward. Patient is not taking home blood pressure with RPM; when PT and OT come, they are taking it.   Weight lb -- 196.5 (191.8 - 200.6)

## 2022-08-09 NOTE — Progress Notes (Signed)
Primary Physician/Referring:  Garlan Fillers, MD  [name], female    DOB: [birth year], 89 y.o.   MRN: [id number]  Chief Complaint  Patient presents with   Congestive Heart Failure   Atrial Fibrillation   Follow-up    4 week   HPI:    Lisa Mcclure  is a 89 y.o. female  with  obesity, chronic diastolic CHF, hypertensive heart disease and chronic 3A-B kidney disease, paroxysmal atrial fibrillation,  anemia secondary to hemorrhoids and diverticulosis, esophageal reflux, depression, OSA intolerant to CPAP on nocturnal 02, rheumatoid arthritis, and venous insufficiency, aortic and coronary calcification, s/p AAA repair on 12/20/2019 being followed by vascular surgery.    Patient presents to the office post hospital follow-up of heart failure (1/12 through 04/25/2022), again admitted on 06/19/2022 with acute pulm edema, acute on chronic diastolic heart failure, diuresed close to 6 to 7 L of fluid and developed acute renal failure and discharged home on low-dose Entresto and also on Farxiga. She diuresed about 6 L prior to discharge, discharge weight 88.2 kg (194 LBS).  She now presents for a 6-week office visit, unfortunately has started to gain weight again.  She has home health and home physical therapy as well.  States that her daughter brings in poor quality food many times.  Past Medical History:  Diagnosis Date   Acute renal failure superimposed on stage 3b chronic kidney disease (HCC) 06/15/2021   Anemia    h/o hemorrhoidal bleeding and blood transfusion   Chronic diastolic CHF (congestive heart failure) (HCC)    Depression    Diverticulosis    DOE (dyspnea on exertion)    Esophageal reflux    GERD (gastroesophageal reflux disease)    Hypothyroidism    Insomnia    LBP (low back pain)    OAB (overactive bladder)    Obesity    OSA (obstructive sleep apnea)    Osteoarthritis    Paroxysmal atrial fibrillation (HCC)    Rheumatoid arthritis(714.0)     Shoulder pain, bilateral    Unspecified essential hypertension    Venous insufficiency    Social History   Tobacco Use   Smoking status: Former    Packs/day: 0.20    Years: 10.00    Additional pack years: 0.00    Total pack years: 2.00    Types: Cigarettes    Quit date: 04/11/1984    Years since quitting: 38.3    Passive exposure: Past   Smokeless tobacco: Never  Substance Use Topics   Alcohol use: Yes    Alcohol/week: 0.0 standard drinks of alcohol    Comment: cocktail once a week   Marital Status: Widowed  ROS  Review of Systems  Constitutional: Positive for weight gain.  Cardiovascular:  Positive for dyspnea on exertion. Negative for chest pain, leg swelling, orthopnea, palpitations and syncope.  Genitourinary:  Negative for bladder incontinence.   Objective  Blood pressure (!) 132/53, pulse 61, resp. rate 16, height 5\' 4"  (1.626 m), weight 203 lb (92.1 kg), SpO2 93 %.     08/09/2022    3:18 PM 08/02/2022   10:09 AM 07/12/2022    2:55 PM  Vitals with BMI  Height 5\' 4"  5\' 4"    Weight 203 lbs 194 lbs   BMI 34.83 33.28   Systolic 132 178 213  Diastolic 53 92 76  Pulse 61 64 60     Physical Exam Vitals reviewed.  Constitutional:      Appearance: She is  well-developed. She is obese.  Neck:     Vascular: Carotid bruit present. No JVD.  Cardiovascular:     Rate and Rhythm: Normal rate and regular rhythm.     Pulses: Intact distal pulses.     Heart sounds: S1 normal and S2 normal. Murmur heard.     Harsh midsystolic murmur is present with a grade of 3/6 at the upper right sternal border radiating to the apex.     No gallop.  Pulmonary:     Effort: Pulmonary effort is normal. No accessory muscle usage.     Breath sounds: Rales (bases) present.  Abdominal:     General: Bowel sounds are normal.     Palpations: Abdomen is soft.  Musculoskeletal:     Right lower leg: Edema (trace) present.     Left lower leg: Edema (trace) present.    Laboratory examination:    Recent Labs    06/22/22 0032 06/23/22 0044 06/24/22 0040 06/28/22 1313  NA 136 135 134* 140  K 3.6 3.7 4.1 4.9  CL 94* 95* 93* 98  CO2 34* 31 30 28   GLUCOSE 112* 149* 123* 84  BUN 21 24* 34* 36*  CREATININE 1.11* 1.06* 1.67* 1.08*  CALCIUM 8.4* 8.6* 8.5* 8.9  GFRNONAA 48* 51* 29*  --       Latest Ref Rng & Units 06/28/2022    1:13 PM 06/24/2022   12:40 AM 06/23/2022   12:44 AM  CMP  Glucose 70 - 99 mg/dL 84  161  096   BUN 8 - 27 mg/dL 36  34  24   Creatinine 0.57 - 1.00 mg/dL 0.45  4.09  8.11   Sodium 134 - 144 mmol/L 140  134  135   Potassium 3.5 - 5.2 mmol/L 4.9  4.1  3.7   Chloride 96 - 106 mmol/L 98  93  95   CO2 20 - 29 mmol/L 28  30  31    Calcium 8.7 - 10.3 mg/dL 8.9  8.5  8.6       Latest Ref Rng & Units 06/20/2022    2:54 AM 06/19/2022    5:20 PM 04/24/2022   10:27 AM  CBC  WBC 4.0 - 10.5 K/uL 9.8  9.4  10.2   Hemoglobin 12.0 - 15.0 g/dL 91.4  78.2  95.6   Hematocrit 36.0 - 46.0 % 43.4  44.7  43.3   Platelets 150 - 400 K/uL 236  250  296    Lipid Panel    Component Value Date/Time   CHOL 126 03/14/2019 1121   TRIG 82 03/14/2019 1121   HDL 49 03/14/2019 1121   CHOLHDL 5.1 CALC 12/19/2006 0916   VLDL 26 12/19/2006 0916   LDLCALC 61 03/14/2019 1121   LDLDIRECT 159.2 12/19/2006 0916    Lab Results  Component Value Date   TSH 1.195 06/19/2022    BNP (last 3 results) Recent Labs    04/23/22 0030 04/24/22 0050 06/19/22 1720  BNP 1,056.8* 855.1* 1,428.4*    ProBNP (last 3 results) Recent Labs    06/28/22 1313  PROBNP 583     Radiology:   CT Abdomen and Pelvis W Contrast 12/20/2019: 1. Large Infrarenal Abdominal Aortic Aneurysm measuring up to 5.7 cm with mild perianeurysmal inflammation. Differential considerations include impending AAA rupture, mycotic AAA, symptomatic AAA. Recommend urgent transfer to emergency department and Vascular Surgery consultation. Aortic aneurysm NOS (ICD10-I71.9). Aortic Atherosclerosis (ICD10-I70.0). 2. No  other acute or inflammatory process identified. 5 cm fat containing  left pelvic hernia. Large bowel diverticulosis. 3. Simple appearing 4 cm left ovarian cyst, recommend follow-up Ultrasound in 6-12 months  Cardiac Studies:   Lexiscan myoview stress test 12/08/2017: 1. Lexiscan stress test was performed. Exercise capacity was not assessed. Stress symptoms included abdominal pain. Blood pressure was normal. The resting and stress electrocardiogram demonstrated normal sinus rhythm, normal resting conduction, no resting arrhythmias and normal rest repolarization. 2. The overall quality of the study is good. There is no evidence of abnormal lung activity. Stress and rest SPECT images demonstrate homogeneous tracer distribution throughout the myocardium. Gated SPECT imaging reveals normal myocardial thickening and wall motion. The left ventricular ejection fraction was normal (54%). 3. Low risk study  Echocardiogram 06/20/2022:  1. Left ventricular ejection fraction, by estimation, is 60 to 65%. The left ventricle has normal function. The left ventricle has no regional wall motion abnormalities. There is moderate concentric left ventricular hypertrophy. Left ventricular  diastolic parameters are consistent with Grade I diastolic dysfunction (impaired relaxation). Elevated left atrial pressure.  2. Right ventricular systolic function is normal. The right ventricular size is normal.  3. Left atrial size was moderately dilated.  4. The mitral valve is normal in structure. No evidence of mitral valve regurgitation. No evidence of mitral stenosis.  5. The aortic valve is severely calcified with restricted leaflet excursion. Visually the valve appears severely stenotic. The aortic valve mean gradient is with a peak gradient of . AVA by VTI of 0.56cm2. Aortic valve mean gradient  measures 18.0 mmHg. Aortic valve peak gradient measures 30.9 mmHg. Aortic valve area, by VTI measures 0.84 cm. DI 0.33.   Compared to 02/08/1922, aortic valve mean gradient was 23 mmHg.  6. The inferior vena cava is dilated in size with >50% respiratory variability, suggesting right atrial pressure of 8 mmHg.  Renal artery duplex  06/21/2022: Right: 1-59% stenosis of the right renal artery. Abnormal right  Resistive Index. Normal size right kidney.  Left:  1-59% stenosis of the left renal artery. Abnormal left Resisitve Index. Normal size of left kidney.  Right kidney length (cm)10.12, left kidney length (cm)10.07   TEE 06/23/2022:  1. Left ventricular ejection fraction, by estimation, is 60 to 65%. The left ventricle has normal function. The left ventricle has no regional wall motion abnormalities.  2. Right ventricular systolic function is normal. The right ventricular size is normal.  3. Left atrial size was mild to moderately dilated. No left atrial/left atrial appendage thrombus was detected.  4. The mitral valve was not assessed. No evidence of mitral valve regurgitation.  5. The aortic valve is calcified. Aortic valve regurgitation is trivial. Moderate aortic valve stenosis.  Peak velocity 3 m/s. Surface probe used: Vmax 3.03 m/s. Vmean 2.21 m/s. Max peak gradient 37 mmHg. Mean gradient 22 mmHg. VTI 71.7  EKG   EKG 05/04/2022: Sinus rhythm with first-degree block at the rate of 60 bpm, left atrial enlargement, poor R progression, probably normal variant.  No evidence of ischemia, normal QT interval.  Compared to 04/20/2022, atrial fibrillation with rapid ventricular response no longer present.  Compared to 10/07/2021, sinus rhythm has been replaced.   Allergies & Medications   Allergies  Allergen Reactions   Statins Other (See Comments)    Muscle aches and INTERNAL BLEEDING   Atorvastatin Other (See Comments)    Myalgias   Gabapentin Other (See Comments)    "Made me loopy"   Methocarbamol Other (See Comments)    "Made me loopy"    Current Outpatient  Medications:    ALEVE ARTHRITIS PAIN 1 % GEL,  Apply 2 g topically 4 (four) times daily as needed (for pain)., Disp: , Rfl:    amiodarone (PACERONE) 100 MG tablet, Take 1 tablet (100 mg total) by mouth every other day., Disp: 60 tablet, Rfl: 5   apixaban (ELIQUIS) 2.5 MG TABS tablet, Take 1 tablet (2.5 mg total) by mouth 2 (two) times daily. (Patient taking differently: Take 2.5 mg by mouth in the morning and at bedtime.), Disp: 180 tablet, Rfl: 3   budesonide (PULMICORT) 0.5 MG/2ML nebulizer solution, Take 2 mLs (0.5 mg total) by nebulization in the morning and at bedtime. (Patient taking differently: Take 0.5 mg by nebulization daily.), Disp: 120 mL, Rfl: 1   busPIRone (BUSPAR) 7.5 MG tablet, Take 7.5 mg by mouth See admin instructions. Take 7.5 mg by mouth at bedtime and an additional 7.5 mg once a day as needed for anxiety, Disp: , Rfl:    carvedilol (COREG) 6.25 MG tablet, Take 1 tablet (6.25 mg total) by mouth 2 (two) times daily with a meal., Disp: 60 tablet, Rfl: 2   Cod Liver Oil CAPS, Take 1 capsule by mouth daily., Disp: , Rfl:    dapagliflozin propanediol (FARXIGA) 10 MG TABS tablet, Take 1 tablet (10 mg total) by mouth daily before breakfast., Disp: 30 tablet, Rfl: 6   diclofenac sodium (VOLTAREN) 1 % GEL, Apply 2.25 g topically 3 (three) times daily as needed (for pain)., Disp: , Rfl:    docusate sodium (COLACE) 100 MG capsule, Take 200 mg by mouth at bedtime., Disp: , Rfl:    furosemide (LASIX) 20 MG tablet, Take 1 tablet (20 mg total) by mouth daily as needed. For weight gain of >2 Lbs, Disp: 30 tablet, Rfl: 0   HYDROcodone-acetaminophen (NORCO) 7.5-325 MG tablet, Take 1 tablet by mouth every 6 (six) hours as needed for moderate pain., Disp: 20 tablet, Rfl: 0   ipratropium (ATROVENT) 0.03 % nasal spray, Place 2 sprays into both nostrils every 12 (twelve) hours. (Patient taking differently: Place 2 sprays into both nostrils every 12 (twelve) hours as needed for rhinitis.), Disp: 30 mL, Rfl: 1   latanoprost (XALATAN) 0.005 % ophthalmic  solution, Place 1 drop into both eyes at bedtime., Disp: , Rfl:    levothyroxine (SYNTHROID) 125 MCG tablet, Take 125 mcg by mouth daily before breakfast. , Disp: , Rfl:    melatonin 3 MG TABS tablet, Take 3 mg by mouth at bedtime., Disp: , Rfl:    Multiple Vitamins-Minerals (PRESERVISION AREDS 2+MULTI VIT) CAPS, Take 1 capsule by mouth in the morning and at bedtime., Disp: , Rfl:    rOPINIRole (REQUIP) 1 MG tablet, Take 1 mg by mouth See admin instructions. Take 1 mg by mouth at 1 PM and 2 mg at 9 PM, Disp: , Rfl: 12   sacubitril-valsartan (ENTRESTO) 24-26 MG, Take 1 tablet by mouth 2 (two) times daily., Disp: , Rfl: 0   SYSTANE COMPLETE PF 0.6 % SOLN, Place 1 drop into both eyes 4 (four) times daily as needed (for dryness)., Disp: , Rfl:    Assessment     ICD-10-CM   1. Acute on chronic diastolic heart failure (HCC)  Z61.09     2. Stage 3b chronic kidney disease (HCC)  N18.32     3. Paroxysmal atrial fibrillation (HCC)  I48.0      This patients CHA2DS2-VASc Score 6 (CHF, HTN, vasc, age, F) and yearly risk of stroke 9.8%.  No orders of the  defined types were placed in this encounter.  Medications Discontinued During This Encounter  Medication Reason   albuterol (PROVENTIL) (2.5 MG/3ML) 0.083% nebulizer solution     Recommendations:   ALGIE CALES  is a 89 y.o.female  with  obesity, chronic diastolic CHF, hypertensive heart disease and chronic 3A-B kidney disease, paroxysmal atrial fibrillation,  anemia secondary to hemorrhoids and diverticulosis, esophageal reflux, depression, OSA intolerant to CPAP on nocturnal 02, rheumatoid arthritis, and venous insufficiency, aortic and coronary calcification, s/p AAA repair on 12/20/2019 being followed by vascular surgery.    Patient presents to the office post hospital follow-up of heart failure (1/12 through 04/25/2022), again admitted on 06/19/2022 and discharged 5 days later with acute pulm edema, acute on chronic diastolic heart failure,  diuresed close to 6 to 7 L of fluid and developed acute renal failure and discharged home on low-dose Entresto and also on Farxiga. She diuresed about 6 L prior to discharge, discharge weight 88.2 kg (194 LBS).  1. Acute on Chronic diastolic (congestive) heart failure She is tolerating low-dose Entresto and renal function has returned back to baseline, she is also on Coreg 6.25 mg twice daily.  Unfortunately she has started to gain weight again from baseline, lowest weight was 190 to 194 pounds, she is now back to 202 pounds to 204 pounds.  On questioning, she has had occasional foods that are rich in salt as her daughter brings at home.  Again extensive discussion held that it does not take much for her to develop recurrent heart failure, will increase Lasix to 40 mg twice daily for about 3 to 4 days.  Patient home weight has started to trend up ward. Patient is not taking home blood pressure with RPM; when PT and OT come, they are taking it.  I do not need any labs.   Weight lb         --          196.5 (191.8 - 200.6)         2. Stage 3b chronic kidney disease (HCC) Patient states that he be chronic kidney disease is stable, continue low-dose Entresto.  Unable to increase dose due to renal insufficiency.  3. Paroxysmal atrial fibrillation (HCC) She is maintaining sinus rhythm on amiodarone.  Office visit in 3 to 4 weeks.    Yates Decamp, MD, [facility] 08/09/2022, 3:55 PM Office: [phone] Fax: [phone] Pager: [phone]

## 2022-08-09 NOTE — Patient Instructions (Signed)
Increase furosemide 20 mg to 2 times a day for the next 3 to 4 days until your weight is down by at least 3 to 4 pounds and then he can go back to 20 mg once a day.

## 2022-08-10 DIAGNOSIS — J9621 Acute and chronic respiratory failure with hypoxia: Secondary | ICD-10-CM | POA: Diagnosis not present

## 2022-08-10 DIAGNOSIS — I5033 Acute on chronic diastolic (congestive) heart failure: Secondary | ICD-10-CM | POA: Diagnosis not present

## 2022-08-10 DIAGNOSIS — J9602 Acute respiratory failure with hypercapnia: Secondary | ICD-10-CM | POA: Diagnosis not present

## 2022-08-10 DIAGNOSIS — N1832 Chronic kidney disease, stage 3b: Secondary | ICD-10-CM | POA: Diagnosis not present

## 2022-08-10 DIAGNOSIS — I130 Hypertensive heart and chronic kidney disease with heart failure and stage 1 through stage 4 chronic kidney disease, or unspecified chronic kidney disease: Secondary | ICD-10-CM | POA: Diagnosis not present

## 2022-08-10 DIAGNOSIS — D631 Anemia in chronic kidney disease: Secondary | ICD-10-CM | POA: Diagnosis not present

## 2022-08-12 DIAGNOSIS — N1832 Chronic kidney disease, stage 3b: Secondary | ICD-10-CM | POA: Diagnosis not present

## 2022-08-12 DIAGNOSIS — I5033 Acute on chronic diastolic (congestive) heart failure: Secondary | ICD-10-CM | POA: Diagnosis not present

## 2022-08-12 DIAGNOSIS — J9602 Acute respiratory failure with hypercapnia: Secondary | ICD-10-CM | POA: Diagnosis not present

## 2022-08-12 DIAGNOSIS — D631 Anemia in chronic kidney disease: Secondary | ICD-10-CM | POA: Diagnosis not present

## 2022-08-12 DIAGNOSIS — J9621 Acute and chronic respiratory failure with hypoxia: Secondary | ICD-10-CM | POA: Diagnosis not present

## 2022-08-12 DIAGNOSIS — I130 Hypertensive heart and chronic kidney disease with heart failure and stage 1 through stage 4 chronic kidney disease, or unspecified chronic kidney disease: Secondary | ICD-10-CM | POA: Diagnosis not present

## 2022-08-15 DIAGNOSIS — I5032 Chronic diastolic (congestive) heart failure: Secondary | ICD-10-CM | POA: Diagnosis not present

## 2022-08-15 DIAGNOSIS — H353221 Exudative age-related macular degeneration, left eye, with active choroidal neovascularization: Secondary | ICD-10-CM | POA: Diagnosis not present

## 2022-08-16 ENCOUNTER — Other Ambulatory Visit: Payer: Self-pay

## 2022-08-16 DIAGNOSIS — I7143 Infrarenal abdominal aortic aneurysm, without rupture: Secondary | ICD-10-CM

## 2022-08-18 ENCOUNTER — Ambulatory Visit: Payer: Self-pay

## 2022-08-18 NOTE — Patient Instructions (Signed)
Visit Information  Thank you for taking time to visit with me today. Please don't hesitate to contact me if I can be of assistance to you.   Following are the goals we discussed today:   Goals Addressed               This Visit's Progress     Patient Stated     I have started a new medication for my heart failure (pt-stated)        Care Coordination Interventions: Review of patient status, including review of consultant's reports, relevant laboratory and other test results, and medications completed Determined patient continue to struggle with restricting her Sodium intake due to patient struggles with preparing meals and often eats out or eats the food brought in by family that is usually not Sodium free Per review of last Cardiology follow up, Dr. Jacinto Halim reinforced to patient the need to restrict Sodium in order to prevent reoccurring CHF exacerbations  Educated patient regarding Meals on Wheels and patient would like to give this service a try Sent in basket message to [facility] BSW requesting further assistance to help patient get signed up for MOW, requested [name] check on patient eligibility for MOM's meals during the interval for immediate help with meals Reviewed and discussed with patient next scheduled PCP lab visit and in person follow up with Dr. Jarold Motto      Other     COMPLETED: I would like to have my Vitamin D level checked        Care Coordination Interventions: Evaluation of current treatment plan related to hx of Vitamin D deficiency and patient's adherence to plan as established by provider Review of patient status, including review of consultant's reports, relevant laboratory and other test results, and medications completed    Component Ref Range & Units 1 mo ago  Vit D, 25-Hydroxy 30 - 100 ng/mL 43.41            Our next appointment is by telephone on 10/12/22 at 12 PM  Please call the care guide team at [phone] if you need to cancel or  reschedule your appointment.   If you are experiencing a Mental Health or Behavioral Health Crisis or need someone to talk to, please call 1-800-273-TALK (toll free, 24 hour hotline) go to [facility] [address], [place] [phone])  Patient verbalizes understanding of instructions and care plan provided today and agrees to view in MyChart. Active MyChart status and patient understanding of how to access instructions and care plan via MyChart confirmed with patient.     Delsa Sale, RN, BSN, CCM Care Management Coordinator [facility] Care Management  Direct Phone: [phone]

## 2022-08-18 NOTE — Patient Outreach (Signed)
  Care Coordination   Follow Up Visit Note   08/18/2022 Name: Lisa Mcclure MRN: [id number] DOB: May 13, 1932  Lisa Mcclure is a 89 y.o. year old female who sees Lisa Fillers, MD for primary care. I spoke with  Lisa Mcclure by phone today.  What matters to the patients health and wellness today?  Patient will restrict her Sodium as directed. She would like help with getting signed up for Meals on Wheels.     Goals Addressed               This Visit's Progress     Patient Stated     I have started a new medication for my heart failure (pt-stated)        Care Coordination Interventions: Review of patient status, including review of consultant's reports, relevant laboratory and other test results, and medications completed Determined patient continue to struggle with restricting her Sodium intake due to patient struggles with preparing meals and often eats out or eats the food brought in by family that is usually not Sodium free Per review of last Cardiology follow up, Lisa Mcclure reinforced to patient the need to restrict Sodium in order to prevent reoccurring CHF exacerbations  Educated patient regarding Meals on Wheels and patient would like to give this service a try Sent in basket message to [facility] BSW requesting further assistance to help patient get signed up for MOW, requested Lisa Mcclure check on patient eligibility for MOM's meals during the interval for immediate help with meals Reviewed and discussed with patient next scheduled PCP lab visit and in person follow up with Lisa Mcclure      Other     COMPLETED: I would like to have my Vitamin D level checked        Care Coordination Interventions: Evaluation of current treatment plan related to hx of Vitamin D deficiency and patient's adherence to plan as established by provider Review of patient status, including review of consultant's reports, relevant laboratory and other test results, and medications  completed    Component Ref Range & Units 1 mo ago  Vit D, 25-Hydroxy 30 - 100 ng/mL 43.41        Interventions Today    Flowsheet Row Most Recent Value  Chronic Disease   Chronic disease during today's visit Congestive Heart Failure (CHF)  General Interventions   General Interventions Discussed/Reviewed General Interventions Discussed, General Interventions Reviewed, Doctor Visits, Communication with  Doctor Visits Discussed/Reviewed Doctor Visits Reviewed, Doctor Visits Discussed, Specialist, PCP  Communication with Social Work  Research officer, trade union for [employer] on [employer]  Education Interventions   Education Provided Provided Education  Provided Verbal Education On Nutrition, Medication, When to see the doctor  Nutrition Interventions   Nutrition Discussed/Reviewed Nutrition Discussed, Nutrition Reviewed, Decreasing salt  Pharmacy Interventions   Pharmacy Dicussed/Reviewed Pharmacy Topics Discussed, Pharmacy Topics Reviewed, Medications and their functions          SDOH assessments and interventions completed:  No     Care Coordination Interventions:  Yes, provided   Follow up plan: Referral made to Lisa Mcclure BSW to assist patient with MOW/MOM's meals Follow up call scheduled for 10/12/22 @12  PM    Encounter Outcome:  Pt. Visit Completed

## 2022-08-19 DIAGNOSIS — I5033 Acute on chronic diastolic (congestive) heart failure: Secondary | ICD-10-CM | POA: Diagnosis not present

## 2022-08-19 DIAGNOSIS — J9602 Acute respiratory failure with hypercapnia: Secondary | ICD-10-CM | POA: Diagnosis not present

## 2022-08-19 DIAGNOSIS — N1832 Chronic kidney disease, stage 3b: Secondary | ICD-10-CM | POA: Diagnosis not present

## 2022-08-19 DIAGNOSIS — D631 Anemia in chronic kidney disease: Secondary | ICD-10-CM | POA: Diagnosis not present

## 2022-08-19 DIAGNOSIS — I130 Hypertensive heart and chronic kidney disease with heart failure and stage 1 through stage 4 chronic kidney disease, or unspecified chronic kidney disease: Secondary | ICD-10-CM | POA: Diagnosis not present

## 2022-08-19 DIAGNOSIS — J9621 Acute and chronic respiratory failure with hypoxia: Secondary | ICD-10-CM | POA: Diagnosis not present

## 2022-08-23 ENCOUNTER — Ambulatory Visit: Payer: Self-pay

## 2022-08-23 DIAGNOSIS — N1832 Chronic kidney disease, stage 3b: Secondary | ICD-10-CM | POA: Diagnosis not present

## 2022-08-23 DIAGNOSIS — D631 Anemia in chronic kidney disease: Secondary | ICD-10-CM | POA: Diagnosis not present

## 2022-08-23 DIAGNOSIS — I130 Hypertensive heart and chronic kidney disease with heart failure and stage 1 through stage 4 chronic kidney disease, or unspecified chronic kidney disease: Secondary | ICD-10-CM | POA: Diagnosis not present

## 2022-08-23 DIAGNOSIS — J9621 Acute and chronic respiratory failure with hypoxia: Secondary | ICD-10-CM | POA: Diagnosis not present

## 2022-08-23 DIAGNOSIS — J9602 Acute respiratory failure with hypercapnia: Secondary | ICD-10-CM | POA: Diagnosis not present

## 2022-08-23 DIAGNOSIS — I5033 Acute on chronic diastolic (congestive) heart failure: Secondary | ICD-10-CM | POA: Diagnosis not present

## 2022-08-23 NOTE — Patient Instructions (Signed)
Visit Information  Thank you for taking time to visit with me today. Please don't hesitate to contact me if I can be of assistance to you.   Following are the goals we discussed today:  -Contact RN Care Manager Lawanna Kobus Little as needed prior to next scheduled call  Your next appointment is by telephone on 7/3 at 12pm with Lawanna Kobus Little  Please call the care guide team at [phone] if you need to cancel or reschedule your appointment.   If you are experiencing a Mental Health or Behavioral Health Crisis or need someone to talk to, please call 911  Patient verbalizes understanding of instructions and care plan provided today and agrees to view in MyChart. Active MyChart status and patient understanding of how to access instructions and care plan via MyChart confirmed with patient.     Bevelyn Ngo, BSW, CDP Social Worker, Certified Dementia Practitioner [facility] Care Management  Care Coordination [phone]

## 2022-08-23 NOTE — Patient Outreach (Signed)
  Care Coordination   Follow Up Visit Note   08/23/2022 Name: Lisa Mcclure MRN: [id number] DOB: 06-25-1932  Lisa Mcclure is a 89 y.o. year old female who sees Garlan Fillers, MD for primary care. I spoke with  Lisa Mcclure by phone today.  What matters to the patients health and wellness today?  The patient is interested in resources for meal preparation    Goals Addressed             This Visit's Progress    COMPLETED: Care Coordination Activities       Care Coordination Interventions: Referral received from RN Care Manager requesting assistance with meal support Determined the patients daughter is scheduled to have back surgery later this week and will be unable to drive after her procedure. The patients daughter is who assists with meals when the patient is unable to prepare them on her own Educated the patient on meals on wheels which is offered through [employer] [place] advising the program does not offer special diets - patient decided this is not a good option for her considering she must eat low sodium meals Discussed opportunity to access [employer] - after reviewing the cost and nutritional facts of low sodium and renal friendly meals it was determined this is also not a viable option for the patient Patient reports she has a freezer full of nutritional food and understands how to prepare her meals to meet her needs. However, it is sometimes hard to have the energy to do so Encouraged the patient to rely on her neighbors for support - patient declined Discussed the patient has her groceries delivered and is able to afford nutritional food. She has two helpers that come into her home 1-2 times weekly to assist with light housework. No other SW needs identified Secretary/administrator with [employer] advising meal resources do not meet the patients needs at this time         SDOH assessments and interventions completed:  Yes  SDOH Interventions  Today    Flowsheet Row Most Recent Value  SDOH Interventions   Food Insecurity Interventions Intervention Not Indicated  Housing Interventions Intervention Not Indicated  Transportation Interventions Intervention Not Indicated        Care Coordination Interventions:  Yes, provided   Interventions Today    Flowsheet Row Most Recent Value  Chronic Disease   Chronic disease during today's visit Congestive Heart Failure (CHF)  General Interventions   General Interventions Discussed/Reviewed General Interventions Discussed, Communication with  Communication with RN  Nutrition Interventions   Nutrition Discussed/Reviewed Nutrition Discussed        Follow up plan: No further intervention required. The patient will remain engaged with RN Care Manager for disease management    Encounter Outcome:  Pt. Visit Completed   Lisa Mcclure, Lisa Mcclure, CDP Social Worker, Certified Dementia Practitioner [facility] Care Management  Care Coordination [phone]

## 2022-08-28 ENCOUNTER — Other Ambulatory Visit: Payer: Self-pay | Admitting: Cardiology

## 2022-08-28 DIAGNOSIS — I5033 Acute on chronic diastolic (congestive) heart failure: Secondary | ICD-10-CM

## 2022-08-29 ENCOUNTER — Telehealth: Payer: Self-pay | Admitting: *Deleted

## 2022-08-29 NOTE — Telephone Encounter (Signed)
   Telephone encounter was:  Unsuccessful.  08/29/2022 Name: DEVANY PAULMAN MRN: [id number] DOB: Jan 27, 1933  Unsuccessful outbound call made today to assist with:  Transportation Needs   Outreach Attempt:  1st Attempt  A HIPAA compliant voice message was left requesting a return call.  Instructed patient to call back at [phone]. Patient called concierge line for transportation returned call and left voicemail  Nelvin Tomb Greenauer -[facility] [place], Population Health [phone] [address] [place] , [place] [address] Email : [email or web address]

## 2022-08-30 ENCOUNTER — Telehealth: Payer: Self-pay | Admitting: *Deleted

## 2022-08-30 NOTE — Telephone Encounter (Signed)
   Telephone encounter was:  Successful.  08/30/2022 Name: Lisa Mcclure MRN: [id number] DOB: May 29, 1932  [name] is a 89 y.o. year old female who is a primary care patient of Garlan Fillers, MD . The community resource team was consulted for assistance with Transportation Needs   Care guide performed the following interventions: Patient provided with information about care guide support team and interviewed to confirm resource needs. called patient to book 4 rides with thn transportation from [employer] ,  Follow Up Plan:  No further follow up planned at this time. The patient has been provided with needed resources.  Yehuda Mao Greenauer -[facility] , Population Health [phone] [address] [place] , [place] [address] Email : [email or web address]

## 2022-08-30 NOTE — Telephone Encounter (Signed)
   Tyjae A Sharpe DOB: 08/27/1932 MRN: [id number]   RIDER WAIVER AND RELEASE OF LIABILITY  For purposes of improving physical access to our facilities, [facility] is pleased to partner with third parties to provide [facility] patients or other authorized individuals the option of convenient, on-demand ground transportation services (the "Transport Services") through use of the technology service that enables users to request on-demand ground transportation from independent third-party providers.  By opting to use and accept these Transport Services, I, the undersigned, hereby agree on behalf of myself, and on behalf of any minor child using the Transport Services for whom I am the parent or legal guardian, as follows:  Transport Services provided to me are provided by independent third-party transportation providers who are not [facility] agents or employees and who are unaffiliated with [facility]. [facility] is neither a transportation carrier nor a common or public carrier. [facility] has no control over the quality or safety of the transportation that occurs as a result of the Transport Services. [facility] cannot guarantee that any third-party transportation provider will complete any arranged transportation service. [facility] makes no representation, warranty, or guarantee regarding the reliability, timeliness, quality, safety, suitability, or availability of any of the Transport Services or that they will be error free. I fully understand that traveling by vehicle involves risks and dangers of serious bodily injury, including permanent disability, paralysis, and death. I agree, on behalf of myself and on behalf of any minor child using the Transport Services for whom I am the parent or legal guardian, that the entire risk arising out of my use of the Transport Services remains solely with me, to the maximum extent permitted under applicable law. The Transport Services are provided  "as is" and "as available." [facility] disclaims all representations and warranties, express, implied or statutory, not expressly set out in these terms, including the implied warranties of merchantability and fitness for a particular purpose. I hereby waive and release [facility], its agents, employees, officers, directors, representatives, insurers, attorneys, assigns, successors, subsidiaries, and affiliates from any and all past, present, or future claims, demands, liabilities, actions, causes of action, or suits of any kind directly or indirectly arising from acceptance and use of the Transport Services. I further waive and release [facility] and its affiliates from all present and future liability and responsibility for any injury or death to persons or damages to property caused by or related to the use of the Transport Services. I have read this Waiver and Release of Liability, and I understand the terms used in it and their legal significance. This Waiver is freely and voluntarily given with the understanding that my right (as well as the right of any minor child for whom I am the parent or legal guardian using the Transport Services) to legal recourse against  in connection with the Transport Services is knowingly surrendered in return for use of these services.   I attest that I read the consent document to Janit A Kneisel, gave Ms. Andreatta the opportunity to ask questions and answered the questions asked (if any). I affirm that Azlin A Nuttall then provided consent for she's participation in this program.     Khani Paino Greenauer-[facility]an                             

## 2022-09-01 ENCOUNTER — Ambulatory Visit (HOSPITAL_COMMUNITY)
Admission: RE | Admit: 2022-09-01 | Discharge: 2022-09-01 | Disposition: A | Discharge status: Home / Self Care | Payer: Medicare Other | Source: Ambulatory Visit | Attending: Vascular Surgery | Admitting: Vascular Surgery

## 2022-09-01 DIAGNOSIS — I7143 Infrarenal abdominal aortic aneurysm, without rupture: Secondary | ICD-10-CM | POA: Insufficient documentation

## 2022-09-01 MED ORDER — IOHEXOL 350 MG/ML SOLN
100.0000 mL | Freq: Once | INTRAVENOUS | Status: AC | PRN
  Administered 2022-09-01: 100 mL via INTRAVENOUS

## 2022-09-02 ENCOUNTER — Other Ambulatory Visit: Payer: Self-pay | Admitting: Cardiology

## 2022-09-02 DIAGNOSIS — I5032 Chronic diastolic (congestive) heart failure: Secondary | ICD-10-CM

## 2022-09-06 ENCOUNTER — Ambulatory Visit (INDEPENDENT_AMBULATORY_CARE_PROVIDER_SITE_OTHER): Payer: Medicare Other | Admitting: Vascular Surgery

## 2022-09-06 ENCOUNTER — Encounter: Payer: Self-pay | Admitting: Vascular Surgery

## 2022-09-06 ENCOUNTER — Other Ambulatory Visit: Payer: Self-pay | Admitting: Vascular Surgery

## 2022-09-06 VITALS — BP 158/72 | HR 59 | Temp 97.8°F | Resp 16 | Ht 64.0 in | Wt 197.0 lb

## 2022-09-06 DIAGNOSIS — I9789 Other postprocedural complications and disorders of the circulatory system, not elsewhere classified: Secondary | ICD-10-CM

## 2022-09-06 DIAGNOSIS — I7143 Infrarenal abdominal aortic aneurysm, without rupture: Secondary | ICD-10-CM | POA: Diagnosis not present

## 2022-09-06 LAB — POCT I-STAT CREATININE: Creatinine, Ser: 0.9 mg/dL (ref 0.44–1.00)

## 2022-09-06 NOTE — Progress Notes (Signed)
Patient name: Lisa Mcclure MRN: [id number] DOB: 06/17/1932 Sex: female  REASON FOR VISIT:.  Follow-up after CTA for enlarging aneurysm sac with type II endoleak after EVAR  HPI: Lisa Mcclure is a 89 y.o. female with multiple medical problems as noted below that presents for follow-up after CTA for enlarging aneurysm sac with suspected type II endoleak after EVAR. I initially met her in the ED when she had new onset abdominal pain that we felt was related to her enlarging AAA.  Her aneurysm was repaired on 12/20/2019 with stent graft.  She did get a CTA on 04/14/2020 that showed good seal but she did have evidence of a type II endoleak but otherwise aneurysm has shown no signs of growth and was actually decreasing in size on previous visits.   Last several EVAR duplexes have shown slowly enlarging aneurysm sac.  Last EVAR duplex on 08/02/2022 showed enlarging aneurysm sac to 6.1 cm with no overt endoleak.  I sent her for CT.  No abdominal or back pain today.  Past Medical History:  Diagnosis Date   Acute renal failure superimposed on stage 3b chronic kidney disease (HCC) 06/15/2021   Anemia    h/o hemorrhoidal bleeding and blood transfusion   Chronic diastolic CHF (congestive heart failure) (HCC)    Depression    Diverticulosis    DOE (dyspnea on exertion)    Esophageal reflux    GERD (gastroesophageal reflux disease)    Hypothyroidism    Insomnia    LBP (low back pain)    OAB (overactive bladder)    Obesity    OSA (obstructive sleep apnea)    Osteoarthritis    Paroxysmal atrial fibrillation (HCC)    Rheumatoid arthritis(714.0)    Shoulder pain, bilateral    Unspecified essential hypertension    Venous insufficiency     Past Surgical History:  Procedure Laterality Date   ABDOMINAL AORTIC ENDOVASCULAR STENT GRAFT N/A 12/20/2019   Procedure: Aortogram including catheter selection of aorta and bilateral iliac arteriogram, Endovascular repair of infrarenal abdominal aortic  aneurysm with bifurcated stent graft (26 mm x 14 x 12 main body, right bell bottom with a 20 mm x 10 cm piece, and left bell bottom with a 16 mm x 12 cm piece) ;  Surgeon: Cephus Shelling, MD;  Location: [facility] OR;  Service: Vascular;  Laterality: N/A;   APPENDECTOMY  1953   BIOPSY  06/18/2021   Procedure: BIOPSY;  Surgeon: Kerin Salen, MD;  Location: [facility] ENDOSCOPY;  Service: Gastroenterology;;   bladder abduction-1996  1996   breast biopsy Right 1980   CARDIOVERSION N/A 12/24/2019   Procedure: CARDIOVERSION;  Surgeon: Yates Decamp, MD;  Location: [facility] ENDOSCOPY;  Service: Cardiovascular;  Laterality: N/A;   CARDIOVERSION N/A 04/25/2022   Procedure: CARDIOVERSION;  Surgeon: Yates Decamp, MD;  Location: [facility] ENDOSCOPY;  Service: Cardiovascular;  Laterality: N/A;   CESAREAN SECTION     1957, 1961, 1964   COSMETIC SURGERY  1996   CYSTOCELE REPAIR     ESOPHAGOGASTRODUODENOSCOPY (EGD) WITH PROPOFOL N/A 06/18/2021   Procedure: ESOPHAGOGASTRODUODENOSCOPY (EGD) WITH PROPOFOL;  Surgeon: Kerin Salen, MD;  Location: [facility] ENDOSCOPY;  Service: Gastroenterology;  Laterality: N/A;   FLEXIBLE SIGMOIDOSCOPY N/A 04/10/2015   Procedure: FLEXIBLE SIGMOIDOSCOPY;  Surgeon: Willis Modena, MD;  Location[facility] ENDOSCOPY;  Service: Endoscopy;  Laterality: N/A;   knee arthroscopy Right 1996, 2010   KNEE ARTHROSCOPY W/ AUTOGENOUS CARTILAGE IMPLANTATION (ACI) PROCEDURE Left 1994, 1995   REFRACTIVE SURGERY  01/2020   SHOULDER  SURGERY  1990   TEE WITHOUT CARDIOVERSION N/A 12/24/2019   Procedure: TRANSESOPHAGEAL ECHOCARDIOGRAM (TEE);  Surgeon: Yates Decamp, MD;  Location: [facility] ENDOSCOPY;  Service: Cardiovascular;  Laterality: N/A;   TEE WITHOUT CARDIOVERSION N/A 06/23/2022   Procedure: TRANSESOPHAGEAL ECHOCARDIOGRAM (TEE);  Surgeon: Clotilde Dieter, DO;  Location: [facility] ENDOSCOPY;  Service: Cardiovascular;  Laterality: N/A;   TOTAL ABDOMINAL HYSTERECTOMY  1972   ULTRASOUND GUIDANCE FOR VASCULAR ACCESS Bilateral 12/20/2019   Procedure:  Ultrasound-guided access of bilateral common femoral arteries for delivery of endograft and percutaneous closure;  Surgeon: Cephus Shelling, MD;  Location: [facility] OR;  Service: Vascular;  Laterality: Bilateral;   VESICOVAGINAL FISTULA CLOSURE W/ TAH     WRIST SURGERY  1967    Family History  Problem Relation Age of Onset   Lung cancer Mother    COPD Father    Breast cancer Maternal Aunt    Heart attack Maternal Grandmother 30   Lung cancer Son     SOCIAL HISTORY: Social History   Tobacco Use   Smoking status: Former    Packs/day: 0.20    Years: 10.00    Additional pack years: 0.00    Total pack years: 2.00    Types: Cigarettes    Quit date: 04/11/1984    Years since quitting: 38.4    Passive exposure: Past   Smokeless tobacco: Never  Substance Use Topics   Alcohol use: Yes    Alcohol/week: 0.0 standard drinks of alcohol    Comment: cocktail once a week    Allergies  Allergen Reactions   Statins Other (See Comments)    Muscle aches and INTERNAL BLEEDING   Atorvastatin Other (See Comments)    Myalgias   Gabapentin Other (See Comments)    "Made me loopy"   Methocarbamol Other (See Comments)    "Made me loopy"    Current Outpatient Medications  Medication Sig Dispense Refill   ALEVE ARTHRITIS PAIN 1 % GEL Apply 2 g topically 4 (four) times daily as needed (for pain).     amiodarone (PACERONE) 100 MG tablet Take 1 tablet (100 mg total) by mouth every other day. 60 tablet 5   apixaban (ELIQUIS) 2.5 MG TABS tablet Take 1 tablet (2.5 mg total) by mouth 2 (two) times daily. (Patient taking differently: Take 2.5 mg by mouth in the morning and at bedtime.) 180 tablet 3   budesonide (PULMICORT) 0.5 MG/2ML nebulizer solution Take 2 mLs (0.5 mg total) by nebulization in the morning and at bedtime. (Patient taking differently: Take 0.5 mg by nebulization daily.) 120 mL 1   busPIRone (BUSPAR) 7.5 MG tablet Take 7.5 mg by mouth See admin instructions. Take 7.5 mg by mouth at bedtime  and an additional 7.5 mg once a day as needed for anxiety     carvedilol (COREG) 6.25 MG tablet TAKE ONE TABLET BY MOUTH TWICE DAILY WITH A MEAL 60 tablet 2   Cod Liver Oil CAPS Take 1 capsule by mouth daily.     dapagliflozin propanediol (FARXIGA) 10 MG TABS tablet Take 1 tablet (10 mg total) by mouth daily before breakfast. 30 tablet 6   diclofenac sodium (VOLTAREN) 1 % GEL Apply 2.25 g topically 3 (three) times daily as needed (for pain).     docusate sodium (COLACE) 100 MG capsule Take 200 mg by mouth at bedtime.     furosemide (LASIX) 20 MG tablet TAKE TWO TABLETS BY MOUTH daily AS NEEDED FOR fluid 90 tablet 0   HYDROcodone-acetaminophen (NORCO) 7.5-325 MG  tablet Take 1 tablet by mouth every 6 (six) hours as needed for moderate pain. 20 tablet 0   ipratropium (ATROVENT) 0.03 % nasal spray Place 2 sprays into both nostrils every 12 (twelve) hours. (Patient taking differently: Place 2 sprays into both nostrils every 12 (twelve) hours as needed for rhinitis.) 30 mL 1   latanoprost (XALATAN) 0.005 % ophthalmic solution Place 1 drop into both eyes at bedtime.     levothyroxine (SYNTHROID) 125 MCG tablet Take 125 mcg by mouth daily before breakfast.      melatonin 3 MG TABS tablet Take 3 mg by mouth at bedtime.     Multiple Vitamins-Minerals (PRESERVISION AREDS 2+MULTI VIT) CAPS Take 1 capsule by mouth in the morning and at bedtime.     rOPINIRole (REQUIP) 1 MG tablet Take 1 mg by mouth See admin instructions. Take 1 mg by mouth at 1 PM and 2 mg at 9 PM  12   sacubitril-valsartan (ENTRESTO) 24-26 MG Take 1 tablet by mouth 2 (two) times daily.  0   SYSTANE COMPLETE PF 0.6 % SOLN Place 1 drop into both eyes 4 (four) times daily as needed (for dryness).     No current facility-administered medications for this visit.    REVIEW OF SYSTEMS:  [X]  denotes positive finding, [ ]  denotes negative finding Cardiac  Comments:  Chest pain or chest pressure:    Shortness of breath upon exertion:    Short of  breath when lying flat:    Irregular heart rhythm:        Vascular    Pain in calf, thigh, or hip brought on by ambulation:    Pain in feet at night that wakes you up from your sleep:     Blood clot in your veins:    Leg swelling:         Pulmonary    Oxygen at home:    Productive cough:     Wheezing:         Neurologic    Sudden weakness in arms or legs:     Sudden numbness in arms or legs:     Sudden onset of difficulty speaking or slurred speech:    Temporary loss of vision in one eye:     Problems with dizziness:         Gastrointestinal    Blood in stool:     Vomited blood:         Genitourinary    Burning when urinating:     Blood in urine:        Psychiatric    Major depression:         Hematologic    Bleeding problems:    Problems with blood clotting too easily:        Skin    Rashes or ulcers:        Constitutional    Fever or chills:      PHYSICAL EXAM: Vitals:   09/06/22 1115  BP: (!) 158/72  Pulse: (!) 59  Resp: 16  Temp: 97.8 F (36.6 C)  TempSrc: Temporal  SpO2: 91%  Weight: 197 lb (89.4 kg)  Height: 5\' 4"  (1.626 m)    GENERAL: The patient is a well-nourished female, in no acute distress. The vital signs are documented above. CARDIAC: There is a regular rate and rhythm.  VASCULAR:  Palpable femoral pulses bilaterally ABDOMEN: soft, NT, ND.  DATA:   CTA reviewed with evidence of enlarging aneurysm sac with type II  endoleak likely from IMA and lumbar artery, aneurysm sac measures 5.9 cm in maximal diameter  EVAR duplex 08/02/22 shows 5.9 x 6.1 cm AAA with no endoleak (previous aortic diameter 5.7 x 4.6 cm)  Assessment/Plan:  89 year old female presents for follow-up after CTA for evaluation of enlarging aneurysm sac after previous EVAR.  Her EVAR was done 12/20/2019 for symptomatic 5.7 cm aneurysm.  Initially her aneurysm sac diameter was decreasing and the last several EVAR studies have shown an increasing sac size.  I reviewed her CTA  discussed her stent is still in good position with no leakage proximal or distal.  She does have evidence of a type II endoleak.  I suspect this is from an IMA and a lumbar.  I have discussed her case with Dr. Malachy Moan with IR who is willing to see her and evaluate her for possible coil embolization and much appreciate his help.  I discussed all this with the patient.  I will see her back in 6 months with EVAR duplex.    Cephus Shelling, MD [facility] Office: [phone]

## 2022-09-08 ENCOUNTER — Telehealth: Payer: Self-pay

## 2022-09-08 ENCOUNTER — Telehealth: Payer: Self-pay | Admitting: *Deleted

## 2022-09-08 ENCOUNTER — Encounter: Payer: Self-pay | Admitting: Cardiology

## 2022-09-08 ENCOUNTER — Ambulatory Visit: Payer: Medicare Other | Admitting: Cardiology

## 2022-09-08 VITALS — BP 130/78 | HR 67 | Resp 16 | Ht 64.0 in | Wt 199.4 lb

## 2022-09-08 DIAGNOSIS — I129 Hypertensive chronic kidney disease with stage 1 through stage 4 chronic kidney disease, or unspecified chronic kidney disease: Secondary | ICD-10-CM | POA: Diagnosis not present

## 2022-09-08 DIAGNOSIS — N1831 Chronic kidney disease, stage 3a: Secondary | ICD-10-CM | POA: Diagnosis not present

## 2022-09-08 DIAGNOSIS — N1832 Chronic kidney disease, stage 3b: Secondary | ICD-10-CM

## 2022-09-08 DIAGNOSIS — I480 Paroxysmal atrial fibrillation: Secondary | ICD-10-CM | POA: Diagnosis not present

## 2022-09-08 DIAGNOSIS — I5033 Acute on chronic diastolic (congestive) heart failure: Secondary | ICD-10-CM | POA: Diagnosis not present

## 2022-09-08 NOTE — Telephone Encounter (Signed)
Patient does not have BP readings to share this month. She endorses she has already returned her BP meter to the office. Patient has continued to monitor her weight which is stable at this time.   30-day weight avg: 196.7 (194.0 - 200.6)  08/31/22 7:43 AM 197.3    08/30/22 5:16 AM 197.8    08/29/22 6:48 AM 197.5    08/28/22 11:45 AM 197.5    08/27/22 4:06 PM 197.3    08/27/22 7:29 AM 195.3    08/26/22 5:10 AM 198.4    08/25/22 7:55 AM 197.5    08/24/22 5:07 AM 196.2    08/23/22 4:40 AM 196.2    08/22/22 6:28 AM 196.2    08/21/22 5:12 PM 196.2    08/21/22 5:06 AM 196.2    08/20/22 4:27 AM 198.4    08/18/22 4:22 AM 196.2    08/17/22 6:28 AM 196.2    08/16/22 8:25 AM 196.2    08/15/22 6:59 AM 196.2    08/14/22 12:26 PM 196.2    08/14/22 5:07 AM 196.2

## 2022-09-08 NOTE — Progress Notes (Signed)
Primary Physician/Referring:  Garlan Fillers, MD  [name], female    DOB: 28-Jan-1933, 89 y.o.   MRN: [id number]  No chief complaint on file.  HPI:    Lisa Mcclure  is a 89 y.o. female  with  obesity, chronic diastolic CHF, hypertensive heart disease and chronic 3A-B kidney disease, paroxysmal atrial fibrillation,  anemia secondary to hemorrhoids and diverticulosis, esophageal reflux, depression, OSA intolerant to CPAP on nocturnal 02, rheumatoid arthritis, and venous insufficiency, aortic and coronary calcification, s/p AAA repair on 12/20/2019 being followed by vascular surgery.    Patient presents to the office post hospital follow-up of heart failure (1/12 through 04/25/2022), again admitted on 06/19/2022 with acute pulm edema, acute on chronic diastolic heart failure, diuresed close to 6 to 7 L of fluid and developed acute renal failure and discharged home on low-dose Entresto and also on Farxiga. She diuresed about 6 L prior to discharge, discharge weight 88.2 kg (194 LBS).  She now presents for a 6-week office visit, weight has remained stable, dyspnea has remained stable.  She has home health and home physical therapy as well.   Past Medical History:  Diagnosis Date   Acute renal failure superimposed on stage 3b chronic kidney disease (HCC) 06/15/2021   Anemia    h/o hemorrhoidal bleeding and blood transfusion   Chronic diastolic CHF (congestive heart failure) (HCC)    Depression    Diverticulosis    DOE (dyspnea on exertion)    Esophageal reflux    GERD (gastroesophageal reflux disease)    Hypothyroidism    Insomnia    LBP (low back pain)    OAB (overactive bladder)    Obesity    OSA (obstructive sleep apnea)    Osteoarthritis    Paroxysmal atrial fibrillation (HCC)    Rheumatoid arthritis(714.0)    Shoulder pain, bilateral    Unspecified essential hypertension    Venous insufficiency    Social History   Tobacco Use   Smoking status:  Former    Packs/day: 0.20    Years: 10.00    Additional pack years: 0.00    Total pack years: 2.00    Types: Cigarettes    Quit date: 04/11/1984    Years since quitting: 38.4    Passive exposure: Past   Smokeless tobacco: Never  Substance Use Topics   Alcohol use: Yes    Alcohol/week: 0.0 standard drinks of alcohol    Comment: cocktail once a week   Marital Status: Widowed  ROS  Review of Systems  Constitutional: Negative for weight gain.  Cardiovascular:  Positive for dyspnea on exertion. Negative for chest pain, leg swelling, orthopnea, palpitations and syncope.  Genitourinary:  Negative for bladder incontinence.   Objective  Blood pressure 130/78, pulse 67, resp. rate 16, height 5\' 4"  (1.626 m), weight 199 lb 6.4 oz (90.4 kg), SpO2 95 %.     09/08/2022    3:03 PM 09/06/2022   11:15 AM 08/09/2022    3:18 PM  Vitals with BMI  Height 5\' 4"  5\' 4"  5\' 4"   Weight 199 lbs 6 oz 197 lbs 203 lbs  BMI 34.21 33.8 34.83  Systolic 130 158 409  Diastolic 78 72 53  Pulse 67 59 61     Physical Exam Vitals reviewed.  Constitutional:      Appearance: She is well-developed. She is obese.  Neck:     Vascular: Carotid bruit present. No JVD.  Cardiovascular:     Rate and  Rhythm: Normal rate and regular rhythm.     Pulses: Intact distal pulses.     Heart sounds: S1 normal and S2 normal. Murmur heard.     Harsh midsystolic murmur is present with a grade of 3/6 at the upper right sternal border radiating to the apex.     No gallop.  Pulmonary:     Effort: Pulmonary effort is normal. No accessory muscle usage.     Breath sounds: Rales (bases) present.  Abdominal:     General: Bowel sounds are normal.     Palpations: Abdomen is soft.  Musculoskeletal:     Right lower leg: Edema (trace) present.     Left lower leg: Edema (trace) present.    Laboratory examination:   Recent Labs    06/22/22 0032 06/23/22 0044 06/24/22 0040 06/28/22 1313 09/01/22 1120  NA 136 135 134* 140  --   K  3.6 3.7 4.1 4.9  --   CL 94* 95* 93* 98  --   CO2 34* 31 30 28   --   GLUCOSE 112* 149* 123* 84  --   BUN 21 24* 34* 36*  --   CREATININE 1.11* 1.06* 1.67* 1.08* 0.90  CALCIUM 8.4* 8.6* 8.5* 8.9  --   GFRNONAA 48* 51* 29*  --   --       Latest Ref Rng & Units 09/01/2022   11:20 AM 06/28/2022    1:13 PM 06/24/2022   12:40 AM  CMP  Glucose 70 - 99 mg/dL  84  161   BUN 8 - 27 mg/dL  36  34   Creatinine 0.96 - 1.00 mg/dL 0.45  4.09  8.11   Sodium 134 - 144 mmol/L  140  134   Potassium 3.5 - 5.2 mmol/L  4.9  4.1   Chloride 96 - 106 mmol/L  98  93   CO2 20 - 29 mmol/L  28  30   Calcium 8.7 - 10.3 mg/dL  8.9  8.5       Latest Ref Rng & Units 06/20/2022    2:54 AM 06/19/2022    5:20 PM 04/24/2022   10:27 AM  CBC  WBC 4.0 - 10.5 K/uL 9.8  9.4  10.2   Hemoglobin 12.0 - 15.0 g/dL 91.4  78.2  95.6   Hematocrit 36.0 - 46.0 % 43.4  44.7  43.3   Platelets 150 - 400 K/uL 236  250  296    Lipid Panel    Component Value Date/Time   CHOL 126 03/14/2019 1121   TRIG 82 03/14/2019 1121   HDL 49 03/14/2019 1121   CHOLHDL 5.1 CALC 12/19/2006 0916   VLDL 26 12/19/2006 0916   LDLCALC 61 03/14/2019 1121   LDLDIRECT 159.2 12/19/2006 0916    Lab Results  Component Value Date   TSH 1.195 06/19/2022    BNP (last 3 results) Recent Labs    04/23/22 0030 04/24/22 0050 06/19/22 1720  BNP 1,056.8* 855.1* 1,428.4*   ProBNP (last 3 results) Recent Labs    06/28/22 1313  PROBNP 583   Radiology:   CT Abdomen and Pelvis W Contrast 12/20/2019: 1. Large Infrarenal Abdominal Aortic Aneurysm measuring up to 5.7 cm with mild perianeurysmal inflammation. Differential considerations include impending AAA rupture, mycotic AAA, symptomatic AAA. Recommend urgent transfer to emergency department and Vascular Surgery consultation. Aortic aneurysm NOS (ICD10-I71.9). Aortic Atherosclerosis (ICD10-I70.0). 2. No other acute or inflammatory process identified. 5 cm fat containing left pelvic hernia. Large  bowel diverticulosis. 3.  Simple appearing 4 cm left ovarian cyst, recommend follow-up Ultrasound in 6-12 months  Cardiac Studies:   Lexiscan myoview stress test 12/08/2017: 1. Lexiscan stress test was performed. Exercise capacity was not assessed. Stress symptoms included abdominal pain. Blood pressure was normal. The resting and stress electrocardiogram demonstrated normal sinus rhythm, normal resting conduction, no resting arrhythmias and normal rest repolarization. 2. The overall quality of the study is good. There is no evidence of abnormal lung activity. Stress and rest SPECT images demonstrate homogeneous tracer distribution throughout the myocardium. Gated SPECT imaging reveals normal myocardial thickening and wall motion. The left ventricular ejection fraction was normal (54%). 3. Low risk study  Echocardiogram 06/20/2022:  1. Left ventricular ejection fraction, by estimation, is 60 to 65%. The left ventricle has normal function. The left ventricle has no regional wall motion abnormalities. There is moderate concentric left ventricular hypertrophy. Left ventricular  diastolic parameters are consistent with Grade I diastolic dysfunction (impaired relaxation). Elevated left atrial pressure.  2. Right ventricular systolic function is normal. The right ventricular size is normal.  3. Left atrial size was moderately dilated.  4. The mitral valve is normal in structure. No evidence of mitral valve regurgitation. No evidence of mitral stenosis.  5. The aortic valve is severely calcified with restricted leaflet excursion. Visually the valve appears severely stenotic. The aortic valve mean gradient is with a peak gradient of . AVA by VTI of 0.56cm2. Aortic valve mean gradient  measures 18.0 mmHg. Aortic valve peak gradient measures 30.9 mmHg. Aortic valve area, by VTI measures 0.84 cm. DI 0.33.  Compared to 02/08/1922, aortic valve mean gradient was 23 mmHg.  6. The inferior vena cava  is dilated in size with >50% respiratory variability, suggesting right atrial pressure of 8 mmHg.  Renal artery duplex  06/21/2022: Right: 1-59% stenosis of the right renal artery. Abnormal right  Resistive Index. Normal size right kidney.  Left:  1-59% stenosis of the left renal artery. Abnormal left Resisitve Index. Normal size of left kidney.  Right kidney length (cm)10.12, left kidney length (cm)10.07   TEE 06/23/2022:  1. Left ventricular ejection fraction, by estimation, is 60 to 65%. The left ventricle has normal function. The left ventricle has no regional wall motion abnormalities.  2. Right ventricular systolic function is normal. The right ventricular size is normal.  3. Left atrial size was mild to moderately dilated. No left atrial/left atrial appendage thrombus was detected.  4. The mitral valve was not assessed. No evidence of mitral valve regurgitation.  5. The aortic valve is calcified. Aortic valve regurgitation is trivial. Moderate aortic valve stenosis.  Peak velocity 3 m/s. Surface probe used: Vmax 3.03 m/s. Vmean 2.21 m/s. Max peak gradient 37 mmHg. Mean gradient 22 mmHg. VTI 71.7  EKG   EKG 04/10/2023: Sinus rhythm with first-degree AV block at the rate of 66 bpm, normal axis, poor R progression, probably normal variant however cannot exclude anteroseptal infarct old.  Single PVC.  Compared to 05/04/2022, no change.  Allergies & Medications   Allergies  Allergen Reactions   Statins Other (See Comments)    Muscle aches and INTERNAL BLEEDING   Atorvastatin Other (See Comments)    Myalgias   Gabapentin Other (See Comments)    "Made me loopy"   Methocarbamol Other (See Comments)    "Made me loopy"    Current Outpatient Medications:    ALEVE ARTHRITIS PAIN 1 % GEL, Apply 2 g topically 4 (four) times daily as needed (for pain)., Disp: ,  Rfl:    amiodarone (PACERONE) 100 MG tablet, Take 1 tablet (100 mg total) by mouth every other day., Disp: 60 tablet, Rfl: 5    apixaban (ELIQUIS) 2.5 MG TABS tablet, Take 1 tablet (2.5 mg total) by mouth 2 (two) times daily. (Patient taking differently: Take 2.5 mg by mouth in the morning and at bedtime.), Disp: 180 tablet, Rfl: 3   budesonide (PULMICORT) 0.5 MG/2ML nebulizer solution, Take 2 mLs (0.5 mg total) by nebulization in the morning and at bedtime. (Patient taking differently: Take 0.5 mg by nebulization daily.), Disp: 120 mL, Rfl: 1   busPIRone (BUSPAR) 7.5 MG tablet, Take 7.5 mg by mouth See admin instructions. Take 7.5 mg by mouth at bedtime and an additional 7.5 mg once a day as needed for anxiety, Disp: , Rfl:    carvedilol (COREG) 6.25 MG tablet, TAKE ONE TABLET BY MOUTH TWICE DAILY WITH A MEAL, Disp: 60 tablet, Rfl: 2   Cod Liver Oil CAPS, Take 1 capsule by mouth daily., Disp: , Rfl:    dapagliflozin propanediol (FARXIGA) 10 MG TABS tablet, Take 1 tablet (10 mg total) by mouth daily before breakfast., Disp: 30 tablet, Rfl: 6   diclofenac sodium (VOLTAREN) 1 % GEL, Apply 2.25 g topically 3 (three) times daily as needed (for pain)., Disp: , Rfl:    docusate sodium (COLACE) 100 MG capsule, Take 200 mg by mouth at bedtime., Disp: , Rfl:    furosemide (LASIX) 20 MG tablet, TAKE TWO TABLETS BY MOUTH daily AS NEEDED FOR fluid, Disp: 90 tablet, Rfl: 0   HYDROcodone-acetaminophen (NORCO) 7.5-325 MG tablet, Take 1 tablet by mouth every 6 (six) hours as needed for moderate pain., Disp: 20 tablet, Rfl: 0   ipratropium (ATROVENT) 0.03 % nasal spray, Place 2 sprays into both nostrils every 12 (twelve) hours. (Patient taking differently: Place 2 sprays into both nostrils every 12 (twelve) hours as needed for rhinitis.), Disp: 30 mL, Rfl: 1   latanoprost (XALATAN) 0.005 % ophthalmic solution, Place 1 drop into both eyes at bedtime., Disp: , Rfl:    levothyroxine (SYNTHROID) 125 MCG tablet, Take 125 mcg by mouth daily before breakfast. , Disp: , Rfl:    melatonin 3 MG TABS tablet, Take 3 mg by mouth at bedtime., Disp: , Rfl:     methocarbamol (ROBAXIN) 500 MG tablet, Take 500 mg by mouth every 6 (six) hours as needed., Disp: , Rfl:    Multiple Vitamins-Minerals (PRESERVISION AREDS 2+MULTI VIT) CAPS, Take 1 capsule by mouth in the morning and at bedtime., Disp: , Rfl:    nortriptyline (PAMELOR) 10 MG capsule, Take 10 mg by mouth daily., Disp: , Rfl:    rOPINIRole (REQUIP) 1 MG tablet, Take 1 mg by mouth See admin instructions. Take 1 mg by mouth at 1 PM and 2 mg at 9 PM, Disp: , Rfl: 12   sacubitril-valsartan (ENTRESTO) 24-26 MG, Take 1 tablet by mouth 2 (two) times daily., Disp: , Rfl: 0   SYSTANE COMPLETE PF 0.6 % SOLN, Place 1 drop into both eyes 4 (four) times daily as needed (for dryness)., Disp: , Rfl:    Assessment     ICD-10-CM   1. Acute on chronic diastolic heart failure (HCC)  Z61.09 EKG 12-Lead    2. Stage 3a chronic kidney disease (HCC)  N18.31     3. Paroxysmal atrial fibrillation (HCC)  I48.0      This patients CHA2DS2-VASc Score 6 (CHF, HTN, vasc, age, F) and yearly risk of stroke 9.8%.  No orders of the defined types were placed in this encounter.  There are no discontinued medications.   Recommendations:   ZIRAH FREIDMAN  is a 89 y.o.female  with  obesity, chronic diastolic CHF, hypertensive heart disease and chronic 3A-B kidney disease, paroxysmal atrial fibrillation,  anemia secondary to hemorrhoids and diverticulosis, esophageal reflux, depression, OSA intolerant to CPAP on nocturnal 02, rheumatoid arthritis, and venous insufficiency, aortic and coronary calcification, s/p AAA repair on 12/20/2019 being followed by vascular surgery.    Patient presents to the office post hospital follow-up of heart failure (1/12 through 04/25/2022), again admitted on 06/19/2022 and discharged 5 days later with acute pulm edema, acute on chronic diastolic heart failure, diuresed close to 6 to 7 L of fluid and developed acute renal failure and discharged home on low-dose Entresto and also on Farxiga. She  diuresed about 6 L prior to discharge, discharge weight 88.2 kg (194 LBS).  1. Acute on Chronic diastolic (congestive) heart failure Patient does best when she weighs around 190 to 293 pounds.  Fortunately her weight is trending down, symptoms have remained stable.  Again reiterated importance of her diet. Overall she is gradually improving, although has still mild heart failure symptoms, overall appears to be stable and does not appear to need any hospitalization. Tolerating Coreg 6.25 mg, Farxiga, low-dose Entresto.  Unable to titrate up orders due to renal issues.  0-day weight avg: 196.7 (194.0 - 200.6)   08/31/22 7:43 AM         197.3                08/30/22 5:16 AM         197.8                08/29/22 6:48 AM         197.5                08/28/22 11:45 AM       197.5                08/27/22 4:06 PM         197.3                08/27/22 7:29 AM         195.3                08/26/22 5:10 AM         198.4                08/25/22 7:55 AM         197.5                08/24/22 5:07 AM         196.2                08/23/22 4:40 AM         196.2                08/22/22 6:28 AM         196.2                08/21/22 5:12 PM         196.2                08/21/22 5:06 AM         196.2                08/20/22 4:27  AM         198.4                08/18/22 4:22 AM           196.2                08/17/22 6:28 AM           196.2                08/16/22 8:25 AM           196.2                08/15/22 6:59 AM           196.2                08/14/22 12:26 PM         196.2                08/14/22 5:07 AM           196.2      2. Stage 3a chronic kidney disease (HCC) Chronic kidney disease is stable, continue low-dose Entresto.  Unable to increase dose due to renal insufficiency. S. Cr has now improved.  3. Paroxysmal atrial fibrillation (HCC) She is maintaining sinus rhythm on 100 mg amiodarone.  Office visit in 3 months.    Yates Decamp, MD, [facility] 09/10/2022, 10:49 AM Office: [phone] Fax: [phone] Pager:  [phone]

## 2022-09-08 NOTE — Telephone Encounter (Signed)
Reviewed the RPM, will discuss with patient today, stable weights

## 2022-09-08 NOTE — Telephone Encounter (Signed)
   Telephone encounter was:  Successful.  09/08/2022 Name: RAKYA GIAMBATTISTA MRN: [id number] DOB: December 10, 1932  Marry Guan Kleen is a 89 y.o. year old female who is a primary care patient of Garlan Fillers, MD . The community resource team was consulted for assistance with Transportation Needs   Care guide performed the following interventions: Follow up call placed to the patient to discuss status of referral.  Follow Up Plan:  No further follow up planned at this time. The patient has been provided with needed resources. Yehuda Mao Greenauer -[facility] [place], Population Health [phone] [address] [place] , [place] [address] Email : [email or web address]

## 2022-09-12 ENCOUNTER — Other Ambulatory Visit: Payer: Self-pay

## 2022-09-12 DIAGNOSIS — Z8679 Personal history of other diseases of the circulatory system: Secondary | ICD-10-CM

## 2022-09-12 DIAGNOSIS — I7143 Infrarenal abdominal aortic aneurysm, without rupture: Secondary | ICD-10-CM

## 2022-09-13 ENCOUNTER — Ambulatory Visit
Admission: RE | Admit: 2022-09-13 | Discharge: 2022-09-13 | Disposition: A | Discharge status: Home / Self Care | Payer: Medicare Other | Source: Ambulatory Visit | Attending: Vascular Surgery | Admitting: Vascular Surgery

## 2022-09-13 DIAGNOSIS — I9789 Other postprocedural complications and disorders of the circulatory system, not elsewhere classified: Secondary | ICD-10-CM

## 2022-09-13 HISTORY — PX: IR RADIOLOGIST EVAL & MGMT: IMG5224

## 2022-09-13 NOTE — Consult Note (Signed)
Chief Complaint: Patient was consulted remotely today (TeleHealth) for Type 2 endoleak at the request of Clark,Christopher J.    Referring Physician(s): Clark,Christopher J  History of Present Illness: Lisa Mcclure is a 89 y.o. female With multiple comorbidities including obesity, chronic diastolic heart failure, hypertensive heart disease, chronic 3A kidney disease, paroxysmal atrial fibrillation, obstructive sleep apnea and abdominal aortic aneurysm.  She underwent endovascular aortic repair for a pending abdominal aortic rupture on 12/20/2019 by Dr. Sherald Hess.  Subsequent follow-up imaging has demonstrated a type II endoleak which has persisted over time and she has enlargement of the excluded aneurysm sac.  At the time of presentation in September 2021, the aneurysm sac measured up to 5.5 cm.  The aneurysm then remained relatively stable to slightly improved in size on follow-up imaging from January 2022.  However, just over 2 years later her aneurysm now measures up to 6.1 cm by my measurements.  She was most recently admitted to the hospital in March for 5 days with acute pulmonary edema and acute on chronic heart failure.  She has since been discharged and is doing better but continues to struggle with her various medical problems.  She is extremely sharp mentally and demonstrates a very good understanding of her condition.  We discussed the physiology of type II endoleak as well as the possible treatment options.   Past Medical History:  Diagnosis Date   Acute renal failure superimposed on stage 3b chronic kidney disease (HCC) 06/15/2021   Anemia    h/o hemorrhoidal bleeding and blood transfusion   Chronic diastolic CHF (congestive heart failure) (HCC)    Depression    Diverticulosis    DOE (dyspnea on exertion)    Esophageal reflux    GERD (gastroesophageal reflux disease)    Hypothyroidism    Insomnia    LBP (low back pain)    OAB (overactive bladder)     Obesity    OSA (obstructive sleep apnea)    Osteoarthritis    Paroxysmal atrial fibrillation (HCC)    Rheumatoid arthritis(714.0)    Shoulder pain, bilateral    Unspecified essential hypertension    Venous insufficiency     Past Surgical History:  Procedure Laterality Date   ABDOMINAL AORTIC ENDOVASCULAR STENT GRAFT N/A 12/20/2019   Procedure: Aortogram including catheter selection of aorta and bilateral iliac arteriogram, Endovascular repair of infrarenal abdominal aortic aneurysm with bifurcated stent graft (26 mm x 14 x 12 main body, right bell bottom with a 20 mm x 10 cm piece, and left bell bottom with a 16 mm x 12 cm piece) ;  Surgeon: Cephus Shelling, MD;  Location: [facility];  Service: Vascular;  Laterality: N/A;   APPENDECTOMY  1953   BIOPSY  06/18/2021   Procedure: BIOPSY;  Surgeon: Kerin Salen, MD;  Location: [facility] ENDOSCOPY;  Service: Gastroenterology;;   bladder abduction-1996  1996   breast biopsy Right 1980   CARDIOVERSION N/A 12/24/2019   Procedure: CARDIOVERSION;  Surgeon: Yates Decamp, MD;  Location: [facility] ENDOSCOPY;  Service: Cardiovascular;  Laterality: N/A;   CARDIOVERSION N/A 04/25/2022   Procedure: CARDIOVERSION;  Surgeon: Yates Decamp, MD;  Location: [facility] ENDOSCOPY;  Service: Cardiovascular;  Laterality: N/A;   CESAREAN SECTION     1957, 1961, 1964   COSMETIC SURGERY  1996   CYSTOCELE REPAIR     ESOPHAGOGASTRODUODENOSCOPY (EGD) WITH PROPOFOL N/A 06/18/2021   Procedure: ESOPHAGOGASTRODUODENOSCOPY (EGD) WITH PROPOFOL;  Surgeon: Kerin Salen, MD;  Location: [facility] ENDOSCOPY;  Service: Gastroenterology;  Laterality: N/A;   FLEXIBLE SIGMOIDOSCOPY N/A 04/10/2015   Procedure: FLEXIBLE SIGMOIDOSCOPY;  Surgeon: Willis Modena, MD;  Location: [facility] ENDOSCOPY;  Service: Endoscopy;  Laterality: N/A;   IR RADIOLOGIST EVAL & MGMT  09/13/2022   knee arthroscopy Right 1996, 2010   KNEE ARTHROSCOPY W/ AUTOGENOUS CARTILAGE IMPLANTATION (ACI) PROCEDURE Left 1994, 1995   REFRACTIVE SURGERY  01/2020    SHOULDER SURGERY  1990   TEE WITHOUT CARDIOVERSION N/A 12/24/2019   Procedure: TRANSESOPHAGEAL ECHOCARDIOGRAM (TEE);  Surgeon: Yates Decamp, MD;  Location: [facility] ENDOSCOPY;  Service: Cardiovascular;  Laterality: N/A;   TEE WITHOUT CARDIOVERSION N/A 06/23/2022   Procedure: TRANSESOPHAGEAL ECHOCARDIOGRAM (TEE);  Surgeon: Clotilde Dieter, DO;  Location: [facility] ENDOSCOPY;  Service: Cardiovascular;  Laterality: N/A;   TOTAL ABDOMINAL HYSTERECTOMY  1972   ULTRASOUND GUIDANCE FOR VASCULAR ACCESS Bilateral 12/20/2019   Procedure: Ultrasound-guided access of bilateral common femoral arteries for delivery of endograft and percutaneous closure;  Surgeon: Cephus Shelling, MD;  Location: [facility] OR;  Service: Vascular;  Laterality: Bilateral;   VESICOVAGINAL FISTULA CLOSURE W/ TAH     WRIST SURGERY  1967    Allergies: Statins, Atorvastatin, Gabapentin, and Methocarbamol  Medications: Prior to Admission medications   Medication Sig Start Date End Date Taking? Authorizing Provider  ALEVE ARTHRITIS PAIN 1 % GEL Apply 2 g topically 4 (four) times daily as needed (for pain).    [provider]  amiodarone (PACERONE) 100 MG tablet Take 1 tablet (100 mg total) by mouth every other day. 05/10/22   Yates Decamp, MD  apixaban (ELIQUIS) 2.5 MG TABS tablet Take 1 tablet (2.5 mg total) by mouth 2 (two) times daily. Patient taking differently: Take 2.5 mg by mouth in the morning and at bedtime. 02/25/22   Yates Decamp, MD  budesonide (PULMICORT) 0.5 MG/2ML nebulizer solution Take 2 mLs (0.5 mg total) by nebulization in the morning and at bedtime. Patient taking differently: Take 0.5 mg by nebulization daily. 03/22/22   Glenford Bayley, NP  busPIRone (BUSPAR) 7.5 MG tablet Take 7.5 mg by mouth See admin instructions. Take 7.5 mg by mouth at bedtime and an additional 7.5 mg once a day as needed for anxiety    [provider]  carvedilol (COREG) 6.25 MG tablet TAKE ONE TABLET BY MOUTH TWICE DAILY WITH A MEAL 08/30/22    Yates Decamp, MD  [facility] Liver Oil CAPS Take 1 capsule by mouth daily.    [provider]  dapagliflozin propanediol (FARXIGA) 10 MG TABS tablet Take 1 tablet (10 mg total) by mouth daily before breakfast. 01/27/22   Yates Decamp, MD  diclofenac sodium (VOLTAREN) 1 % GEL Apply 2.25 g topically 3 (three) times daily as needed (for pain). 10/23/18   [provider]  docusate sodium (COLACE) 100 MG capsule Take 200 mg by mouth at bedtime.    [provider]  furosemide (LASIX) 20 MG tablet TAKE TWO TABLETS BY MOUTH daily AS NEEDED FOR fluid 09/02/22   Yates Decamp, MD  HYDROcodone-acetaminophen (NORCO) 7.5-325 MG tablet Take 1 tablet by mouth every 6 (six) hours as needed for moderate pain. 04/13/15   Vassie Loll, MD  ipratropium (ATROVENT) 0.03 % nasal spray Place 2 sprays into both nostrils every 12 (twelve) hours. Patient taking differently: Place 2 sprays into both nostrils every 12 (twelve) hours as needed for rhinitis. 03/22/22   Glenford Bayley, NP  latanoprost (XALATAN) 0.005 % ophthalmic solution Place 1 drop into both eyes at bedtime.    [provider]  levothyroxine (SYNTHROID) 125 MCG tablet Take 125 mcg by mouth daily before breakfast.     [provider]  melatonin 3 MG TABS tablet Take 3 mg by mouth at bedtime.    [provider]  methocarbamol (ROBAXIN) 500 MG tablet Take 500 mg by mouth every 6 (six) hours as needed. 08/31/22   [provider]  Multiple Vitamins-Minerals (PRESERVISION AREDS 2+MULTI VIT) CAPS Take 1 capsule by mouth in the morning and at bedtime.    [provider]  nortriptyline (PAMELOR) 10 MG capsule Take 10 mg by mouth daily. 08/31/22   [provider]  rOPINIRole (REQUIP) 1 MG tablet Take 1 mg by mouth See admin instructions. Take 1 mg by mouth at 1 PM and 2 mg at 9 PM 06/19/15   [provider]  sacubitril-valsartan (ENTRESTO) 24-26 MG Take 1 tablet by mouth 2 (two) times daily.  06/25/22   Zannie Cove, MD  SYSTANE COMPLETE PF 0.6 % SOLN Place 1 drop into both eyes 4 (four) times daily as needed (for dryness).    [provider]     Family History  Problem Relation Age of Onset   Lung cancer Mother    COPD Father    Breast cancer Maternal Aunt    Heart attack Maternal Grandmother 61   Lung cancer Son     Social History   Socioeconomic History   Marital status: Widowed    Spouse name: Not on file   Number of children: 3   Years of education: College   Highest education level: Bachelor's degree (e.g., BA, AB, BS)  Occupational History   Occupation: Retired  Tobacco Use   Smoking status: Former    Packs/day: 0.20    Years: 10.00    Additional pack years: 0.00    Total pack years: 2.00    Types: Cigarettes    Quit date: 04/11/1984    Years since quitting: 38.4    Passive exposure: Past   Smokeless tobacco: Never  Vaping Use   Vaping Use: Never used  Substance and Sexual Activity   Alcohol use: Yes    Alcohol/week: 0.0 standard drinks of alcohol    Comment: cocktail once a week   Drug use: No   Sexual activity: Not Currently  Other Topics Concern   Not on file  Social History Narrative   Widowed   Lives alone   3 children, 2 living   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, Production designer, theatre/television/film of physician's office   Went to [place] May 2016 for 11 days   Drinks 1 cup of coffee in the morning   Social Determinants of Health   Financial Resource Strain: Low Risk  (08/11/2021)   Overall Financial Resource Strain (CARDIA)    Difficulty of Paying Living Expenses: Not hard at all  Food Insecurity: No Food Insecurity (08/23/2022)   Hunger Vital Sign    Worried About Running Out of Food in the Last Year: Never true    Ran Out of Food in the Last Year: Never true  Transportation Needs: No Transportation Needs (08/23/2022)   PRAPARE - Administrator, Civil Service (Medical): No    Lack of Transportation (Non-Medical): No  Physical  Activity: Inactive (08/11/2021)   Exercise Vital Sign    Days of Exercise per Week: 0 days    Minutes of Exercise per Session: 0 min  Stress: No Stress Concern Present (08/11/2021)   [employer] of Occupational Health - Occupational Stress Questionnaire  Feeling of Stress : Only a little  Social Connections: Moderately Isolated (08/11/2021)   Social Connection and Isolation Panel [NHANES]    Frequency of Communication with Friends and Family: More than three times a week    Frequency of Social Gatherings with Friends and Family: More than three times a week    Attends Religious Services: More than 4 times per year    Active Member of [employer] or Organizations: No    Attends Banker Meetings: Never    Marital Status: Widowed     Review of Systems  Review of Systems: A 12 point ROS discussed and pertinent positives are indicated in the HPI above.  All other systems are negative.  Advance Care Plan: The advanced care plan/surrogate decision maker was discussed at the time of visit and the patient did not wish to discuss or was not able to name a surrogate decision maker or provide an advance care plan.    Physical Exam No direct physical exam was performed (except for noted visual exam findings with Video Visits).    Vital Signs: There were no vitals taken for this visit.  Imaging: IR Radiologist Eval & Mgmt  Result Date: 09/13/2022 EXAM: NEW PATIENT OFFICE VISIT CHIEF COMPLAINT: SEE EPIC NOTE HISTORY OF PRESENT ILLNESS: SEE EPIC NOTE REVIEW OF SYSTEMS: SEE EPIC NOTE PHYSICAL EXAMINATION: SEE EPIC NOTE ASSESSMENT AND PLAN: SEE EPIC NOTE Electronically Signed   By: Malachy Moan M.D.   On: 09/13/2022 16:05   CT ANGIO ABDOMEN PELVIS  W & WO CONTRAST  Result Date: 09/01/2022 CLINICAL DATA:  89 year old female with a history of abdominal aortic aneurysm EXAM: CTA ABDOMEN AND PELVIS WITHOUT AND WITH CONTRAST TECHNIQUE: Multidetector CT imaging of the abdomen and pelvis  was performed using the standard protocol during bolus administration of intravenous contrast. Multiplanar reconstructed images and MIPs were obtained and reviewed to evaluate the vascular anatomy. RADIATION DOSE REDUCTION: This exam was performed according to the departmental dose-optimization program which includes automated exposure control, adjustment of the mA and/or kV according to patient size and/or use of iterative reconstruction technique. CONTRAST:  OMNIPAQUE IOHEXOL 350 MG/ML SOLN COMPARISON:  12/20/2019, 04/14/2020, MR lumbar spine 05/31/2021 FINDINGS: VASCULAR Aorta: Atherosclerotic changes of the lower thoracic aorta. Diameter at the hiatus measures 27 mm. Redemonstration EVAR as endovascular solution of repair infrarenal abdominal aortic aneurysm with infrarenal fixation. There is evidence of persisting type 2 endoleak, with filling on both early and late phase imaging. Potential sources include lumbar arteries as well as the IMA. Prior CT 04/14/2020 measures the aorta 5.5 cm. Current CT demonstrates a maximum diameter 5.9 cm, with interval regrowth. The parasagittal CT images demonstrate that there is increasing convexity at the posterior left aspect of the excluded aneurysm sac in the region of the endoleak. No acute inflammatory changes in the periaortic region. Celiac: Celiac artery patent with mild atherosclerosis at the origin. SMA: SMA patent with mild atherosclerosis at the origin. Renals: - Right: Single right renal artery patent. Mild to moderate atherosclerotic changes at the origin with estimated 50% stenosis. - Left: Single left renal artery patent. Mild to moderate atherosclerotic changes at the origin with estimated 50% narrowing. IMA: IMA has been excluded by the aneurysm sac. Arterial phase imaging demonstrates that retrograde flow to the IMA origin may contribute to endoleak. Right lower extremity: Right iliac limb terminates in the common iliac artery. No evidence of a type 1  B endoleak. Hypogastric arteries patent. External iliac artery patent. Minimal atherosclerosis.  Common femoral artery patent with minimal atherosclerosis. Proximal profunda femoris and SFA patent. Left lower extremity: Left iliac limb terminates in the common iliac artery. No evidence of type 1 B endoleak. Hypogastric artery is patent with some ectasia at the origin. External iliac artery patent without significant atherosclerosis. Common femoral artery patent. Proximal profunda femoris and SFA patent. Veins: Unremarkable appearance of the venous system. Review of the MIP images confirms the above findings. NON-VASCULAR Lower chest: No acute. Hepatobiliary: Unremarkable appearance of the liver. Unremarkable gall bladder. Pancreas: Unremarkable. Spleen: Unremarkable. Adrenals/Urinary Tract: - Right adrenal gland: Unremarkable - Left adrenal gland: Unremarkable. - Right kidney: No hydronephrosis, nephrolithiasis, inflammation, or ureteral dilation. No focal lesion. - Left Kidney: No hydronephrosis, nephrolithiasis, inflammation, or ureteral dilation. No focal lesion. - Urinary Bladder: Unremarkable. Stomach/Bowel: - Stomach: Hiatal hernia.  Otherwise unremarkable stomach. - Small bowel: Unremarkable - Appendix: Appendix is not visualized, however, no inflammatory changes are present adjacent to the cecum to indicate an appendicitis. - Colon: Pan colonic diverticula.  No acute inflammation. Lymphatic: No adenopathy. Mesenteric: No free fluid or air. No mesenteric adenopathy. Reproductive: Fluid attenuating left adnexal cystic lesion which is essentially unchanged in size, previously 3.8 cm, currently 4.1 cm. The SRU adnexal mass consensus guidelines recommend an annual follow-up for lesion such as this, of this size, in this postmenopausal patient. Hysterectomy. Other: No hernia. Musculoskeletal: No evidence of acute fracture. No bony canal narrowing. No significant degenerative changes of the hips. IMPRESSION:  Redemonstration of EVAR for solution of infrarenal abdominal aortic aneurysm. There is evidence of persisting type 2 endoleak with some interval regrowth of the excluded aneurysm sac. The prior CT measured the aorta 5.5 cm (04/14/2020), with current estimated diameter approximately 5.9 cm as above. Aortic atherosclerosis with associated mild mesenteric and renal artery disease. Aortic Atherosclerosis (ICD10-I70.0). Fluid attenuating left adnexal cystic lesion which is essentially unchanged in size, previously 3.8 cm, currently 4.1 cm. The SRU adnexal mass consensus guidelines recommend an annual follow-up for lesion with these characteristics, of this size, in this postmenopausal patient. Additional ancillary findings as above. Signed, Yvone Neu. Miachel Roux, RPVI [facility] Radiology Electronically Signed   By: Gilmer Mor D.O.   On: 09/01/2022 15:25    Labs:  CBC: Recent Labs    04/23/22 0030 04/24/22 1027 06/19/22 1720 06/20/22 0254  WBC 7.6 10.2 9.4 9.8  HGB 12.9 13.3 13.8 13.5  HCT 41.2 43.3 44.7 43.4  PLT 281 296 250 236    COAGS: No results for input(s): "INR", "APTT" in the last 8760 hours.  BMP: Recent Labs    06/21/22 0424 06/22/22 0032 06/23/22 0044 06/24/22 0040 06/28/22 1313 09/01/22 1120  NA 138 136 135 134* 140  --   K 3.8 3.6 3.7 4.1 4.9  --   CL 95* 94* 95* 93* 98  --   CO2 31 34* 31 30 28   --   GLUCOSE 92 112* 149* 123* 84  --   BUN 24* 21 24* 34* 36*  --   CALCIUM 8.3* 8.4* 8.6* 8.5* 8.9  --   CREATININE 1.05* 1.11* 1.06* 1.67* 1.08* 0.90  GFRNONAA 51* 48* 51* 29*  --   --     LIVER FUNCTION TESTS: Recent Labs    06/19/22 1720  BILITOT 0.5  AST 40  ALT 35  ALKPHOS 89  PROT 7.6  ALBUMIN 3.5    TUMOR MARKERS: No results for input(s): "AFPTM", "CEA", "CA199", "CHROMGRNA" in the last 8760 hours.  Assessment and Plan:  Extremely pleasant 89 year old female with a host of medical comorbidities  currently dealing with a persistent type II endoleak following endovascular aortic repair of an abdominal aortic aneurysm.  Further, her aneurysm sac demonstrates persistent slow growth from 5.5 cm at the time of presentation to 6.1 cm presently.  We discussed the physiology and history of type II endoleak as well as a potential methods of endovascular repair including transarterial and direct sac puncture.  Her particular endoleak anatomy I think would be better served with a direct sac puncture.  This procedure is typically performed under general anesthesia but could be performed with moderate sedation if necessary.    She expressed some concerns given her multiple other medical problems and recent conversation with Dr. Jacinto Halim.  I completely understand her hesitation.  The reported risk for rupture due to aneurysm sac enlargement in the setting of type II endoleak is approximately 2-5 %/year.  At age 64, it is unclear if this aneurysm will cause her a definite problem during the remainder of her life.  However, it is likely fixable with a relatively minimally invasive procedure.  I assured her that I would discuss her case with both Dr. Jacinto Halim and Dr. Chestine Spore and that we would get back to her with our consensus recommendations.  She was very appreciative of this approach and looks forward to discussing further.   Thank you for this interesting consult.  I greatly enjoyed meeting Lisa Mcclure and look forward to participating in their care.  A copy of this report was sent to the requesting provider on this date.  Electronically Signed: Sterling Big 09/13/2022, 4:08 PM   I spent a total of 40 Minutes  in remote  clinical consultation, greater than 50% of which was counseling/coordinating care for type 2 endoleak.    Visit type: Audio only (telephone). Audio (no video) only due to patient's lack of internet/smartphone capability. Alternative for in-person consultation at [facility],  [address] [place], [place], [place]. This visit type was conducted due to national recommendations for restrictions regarding the COVID-19 Pandemic (e.g. social distancing).  This format is felt to be most appropriate for this patient at this time.  All issues noted in this document were discussed and addressed.

## 2022-09-14 DIAGNOSIS — I5032 Chronic diastolic (congestive) heart failure: Secondary | ICD-10-CM | POA: Diagnosis not present

## 2022-09-22 DIAGNOSIS — H43393 Other vitreous opacities, bilateral: Secondary | ICD-10-CM | POA: Diagnosis not present

## 2022-09-22 DIAGNOSIS — H35033 Hypertensive retinopathy, bilateral: Secondary | ICD-10-CM | POA: Diagnosis not present

## 2022-09-22 DIAGNOSIS — H353123 Nonexudative age-related macular degeneration, left eye, advanced atrophic without subfoveal involvement: Secondary | ICD-10-CM | POA: Diagnosis not present

## 2022-09-22 DIAGNOSIS — H43813 Vitreous degeneration, bilateral: Secondary | ICD-10-CM | POA: Diagnosis not present

## 2022-09-22 DIAGNOSIS — H353114 Nonexudative age-related macular degeneration, right eye, advanced atrophic with subfoveal involvement: Secondary | ICD-10-CM | POA: Diagnosis not present

## 2022-09-22 DIAGNOSIS — H353221 Exudative age-related macular degeneration, left eye, with active choroidal neovascularization: Secondary | ICD-10-CM | POA: Diagnosis not present

## 2022-09-23 ENCOUNTER — Telehealth: Payer: Self-pay

## 2022-09-23 DIAGNOSIS — I7141 Pararenal abdominal aortic aneurysm, without rupture: Secondary | ICD-10-CM

## 2022-09-23 DIAGNOSIS — I5033 Acute on chronic diastolic (congestive) heart failure: Secondary | ICD-10-CM

## 2022-09-23 MED ORDER — WEGOVY 0.25 MG/0.5ML ~~LOC~~ SOAJ
0.2500 mg | SUBCUTANEOUS | 2 refills | Status: DC
Start: 2022-09-23 — End: 2022-11-03

## 2022-09-23 NOTE — Telephone Encounter (Signed)
ICD-10-CM   1. Acute on chronic diastolic heart failure (HCC)  Z61.09 Semaglutide-Weight Management (WEGOVY) 0.25 MG/0.5ML SOAJ    2. Pararenal abdominal aortic aneurysm (AAA) without rupture (HCC)  I71.41 Semaglutide-Weight Management (WEGOVY) 0.25 MG/0.5ML SOAJ     Meds ordered this encounter  Medications   Semaglutide-Weight Management (WEGOVY) 0.25 MG/0.5ML SOAJ    Sig: Inject 0.25 mg into the skin once a week.    Dispense:  2 mL    Refill:  2

## 2022-09-23 NOTE — Telephone Encounter (Signed)
Can you prescribe her Wegovy or something else for weight loss. She is managing her sodium intake but can't get the weight off.

## 2022-09-23 NOTE — Telephone Encounter (Signed)
She has no diabetes and hence will be hard to do this, expense is also an issue. I will send the rx and see if insurance will cover

## 2022-09-29 ENCOUNTER — Other Ambulatory Visit: Payer: Self-pay | Admitting: Interventional Radiology

## 2022-09-29 DIAGNOSIS — I9789 Other postprocedural complications and disorders of the circulatory system, not elsewhere classified: Secondary | ICD-10-CM

## 2022-10-03 ENCOUNTER — Other Ambulatory Visit: Payer: Self-pay | Admitting: Cardiology

## 2022-10-03 DIAGNOSIS — I5032 Chronic diastolic (congestive) heart failure: Secondary | ICD-10-CM

## 2022-10-03 DIAGNOSIS — I493 Ventricular premature depolarization: Secondary | ICD-10-CM

## 2022-10-03 DIAGNOSIS — I480 Paroxysmal atrial fibrillation: Secondary | ICD-10-CM

## 2022-10-06 DIAGNOSIS — S81812A Laceration without foreign body, left lower leg, initial encounter: Secondary | ICD-10-CM | POA: Diagnosis not present

## 2022-10-06 DIAGNOSIS — M79605 Pain in left leg: Secondary | ICD-10-CM | POA: Diagnosis not present

## 2022-10-06 DIAGNOSIS — L03116 Cellulitis of left lower limb: Secondary | ICD-10-CM | POA: Diagnosis not present

## 2022-10-06 DIAGNOSIS — I10 Essential (primary) hypertension: Secondary | ICD-10-CM | POA: Diagnosis not present

## 2022-10-11 ENCOUNTER — Telehealth (HOSPITAL_COMMUNITY): Payer: Self-pay | Admitting: Radiology

## 2022-10-11 NOTE — Telephone Encounter (Signed)
Called pt to schedule her endoleak repair with Dr. Archer Asa. No answer, left VM for her to call back to schedule. JM

## 2022-10-12 ENCOUNTER — Ambulatory Visit: Payer: Self-pay

## 2022-10-12 DIAGNOSIS — M79605 Pain in left leg: Secondary | ICD-10-CM | POA: Diagnosis not present

## 2022-10-12 DIAGNOSIS — L03116 Cellulitis of left lower limb: Secondary | ICD-10-CM | POA: Diagnosis not present

## 2022-10-12 DIAGNOSIS — I87312 Chronic venous hypertension (idiopathic) with ulcer of left lower extremity: Secondary | ICD-10-CM | POA: Diagnosis not present

## 2022-10-12 DIAGNOSIS — L97929 Non-pressure chronic ulcer of unspecified part of left lower leg with unspecified severity: Secondary | ICD-10-CM | POA: Diagnosis not present

## 2022-10-12 DIAGNOSIS — I10 Essential (primary) hypertension: Secondary | ICD-10-CM | POA: Diagnosis not present

## 2022-10-12 NOTE — Patient Outreach (Signed)
  Care Coordination   Follow Up Visit Note   10/12/2022 Name: Lisa Mcclure MRN: [id number] DOB: [birth year]  Lisa Mcclure is a 89 y.o. year old female who sees Garlan Fillers, MD for primary care. I spoke with  [name] by phone today.  What matters to the patients health and wellness today?  Patient is anxious about an upcoming procedure. She will visit her PCP today to have 2 small lower leg abrasions evaluated.     Goals Addressed               This Visit's Progress     Patient Stated     I have started a new medication for my heart failure (pt-stated)        Care Coordination Interventions: Evaluation of current treatment plan related to  type II endoleak following endovascular aortic repair of an abdominal aortic aneurysm and patient's adherence to plan as established by provider Review of patient status, including review of consultant's reports, relevant laboratory and other test results, and medications completed Discussed with patient she has consented to undergo a minimally invasive procedure per Dr. Archer Asa, with Vascular and Interventional Radiologist Specialist to repair the leak  Determined patient verbalizes understanding of the procedure and risks involved, she has some anxiety about having this procedure but understands it may add years to her life  Determined Dr. Archer Asa will consult with Dr. Jacinto Halim and Dr. Chestine Spore and will schedule a date for the procedure once consensus has been received  Discussed with patient she was given tentative dates of 7/17 or 7/22, her daughter will accompany her to the appointment       Other     To have leg abrasions evaluated and treated        Care Coordination Interventions: Evaluation of current treatment plan related to lower leg abrasions and patient's adherence to plan as established by provider Discussed with patient that she recently injured her right lower leg x 2 this week leading to two small  abrasions Discussed and reviewed she will visit her PCP today for further evaluation and treatment recommendations due to having impaired vascular flow and risk for non-healing of these wounds Positive reinforcement given to patient for being proactive to ensure she receives the proper care for these wounds        Interventions Today    Flowsheet Row Most Recent Value  Chronic Disease   Chronic disease during today's visit Congestive Heart Failure (CHF), Chronic Kidney Disease/End Stage Renal Disease (ESRD), Other  [abrasions to leg]  General Interventions   General Interventions Discussed/Reviewed General Interventions Discussed, General Interventions Reviewed, Doctor Visits  Doctor Visits Discussed/Reviewed Doctor Visits Discussed, Doctor Visits Reviewed, Specialist  Education Interventions   Education Provided Provided Education  Nutrition Interventions   Nutrition Discussed/Reviewed Nutrition Discussed, Nutrition Reviewed, Decreasing salt          SDOH assessments and interventions completed:  No     Care Coordination Interventions:  Yes, provided   Follow up plan: Follow up call scheduled for 11/02/22 @12 :45 PM    Encounter Outcome:  Pt. Visit Completed

## 2022-10-12 NOTE — Patient Instructions (Signed)
Visit Information  Thank you for taking time to visit with me today. Please don't hesitate to contact me if I can be of assistance to you.   Following are the goals we discussed today:   Goals Addressed               This Visit's Progress     Patient Stated     I have started a new medication for my heart failure (pt-stated)        Care Coordination Interventions: Evaluation of current treatment plan related to  type II endoleak following endovascular aortic repair of an abdominal aortic aneurysm and patient's adherence to plan as established by provider Review of patient status, including review of consultant's reports, relevant laboratory and other test results, and medications completed Discussed with patient she has consented to undergo a minimally invasive procedure per Dr. Archer Asa, with [facility] to repair the leak  Determined patient verbalizes understanding of the procedure and risks involved, she has some anxiety about having this procedure but understands it may add years to her life  Determined Dr. Archer Asa will consult with Dr. Jacinto Halim and Dr. Chestine Spore and will schedule a date for the procedure once consensus has been received  Discussed with patient she was given tentative dates of 7/17 or 7/22, her daughter will accompany her to the appointment       Other     To have leg abrasions evaluated and treated        Care Coordination Interventions: Evaluation of current treatment plan related to lower leg abrasions and patient's adherence to plan as established by provider Discussed with patient that she recently injured her right lower leg x 2 this week leading to two small abrasions Discussed and reviewed she will visit her PCP today for further evaluation and treatment recommendations due to having impaired vascular flow and risk for non-healing of these wounds Positive reinforcement given to patient for being proactive to ensure she  receives the proper care for these wounds            Our next appointment is by telephone on 11/02/22 at 12:45 PM  Please call the care guide team at [phone] if you need to cancel or reschedule your appointment.   If you are experiencing a Mental Health or Behavioral Health Crisis or need someone to talk to, please call 1-800-273-TALK (toll free, 24 hour hotline)  Patient verbalizes understanding of instructions and care plan provided today and agrees to view in MyChart. Active MyChart status and patient understanding of how to access instructions and care plan via MyChart confirmed with patient.     Delsa Sale, RN, BSN, CCM Care Management Coordinator [facility] Care Management  Direct Phone: [phone]

## 2022-10-19 DIAGNOSIS — L03116 Cellulitis of left lower limb: Secondary | ICD-10-CM | POA: Diagnosis not present

## 2022-10-19 DIAGNOSIS — L97929 Non-pressure chronic ulcer of unspecified part of left lower leg with unspecified severity: Secondary | ICD-10-CM | POA: Diagnosis not present

## 2022-10-19 DIAGNOSIS — I87312 Chronic venous hypertension (idiopathic) with ulcer of left lower extremity: Secondary | ICD-10-CM | POA: Diagnosis not present

## 2022-10-19 DIAGNOSIS — M79605 Pain in left leg: Secondary | ICD-10-CM | POA: Diagnosis not present

## 2022-10-19 DIAGNOSIS — I10 Essential (primary) hypertension: Secondary | ICD-10-CM | POA: Diagnosis not present

## 2022-10-21 ENCOUNTER — Other Ambulatory Visit: Payer: Self-pay | Admitting: Student

## 2022-10-21 DIAGNOSIS — I7143 Infrarenal abdominal aortic aneurysm, without rupture: Secondary | ICD-10-CM

## 2022-10-24 ENCOUNTER — Other Ambulatory Visit: Payer: Self-pay

## 2022-10-24 ENCOUNTER — Ambulatory Visit (HOSPITAL_COMMUNITY)
Admission: RE | Admit: 2022-10-24 | Discharge: 2022-10-24 | Disposition: A | Discharge status: Home / Self Care | Payer: Medicare Other | Source: Ambulatory Visit | Attending: Interventional Radiology | Admitting: Interventional Radiology

## 2022-10-24 ENCOUNTER — Other Ambulatory Visit: Payer: Self-pay | Admitting: Interventional Radiology

## 2022-10-24 DIAGNOSIS — F32A Depression, unspecified: Secondary | ICD-10-CM | POA: Diagnosis not present

## 2022-10-24 DIAGNOSIS — I714 Abdominal aortic aneurysm, without rupture, unspecified: Secondary | ICD-10-CM | POA: Diagnosis not present

## 2022-10-24 DIAGNOSIS — I5032 Chronic diastolic (congestive) heart failure: Secondary | ICD-10-CM | POA: Insufficient documentation

## 2022-10-24 DIAGNOSIS — G4733 Obstructive sleep apnea (adult) (pediatric): Secondary | ICD-10-CM | POA: Diagnosis not present

## 2022-10-24 DIAGNOSIS — I9789 Other postprocedural complications and disorders of the circulatory system, not elsewhere classified: Secondary | ICD-10-CM

## 2022-10-24 DIAGNOSIS — I480 Paroxysmal atrial fibrillation: Secondary | ICD-10-CM | POA: Diagnosis not present

## 2022-10-24 DIAGNOSIS — M069 Rheumatoid arthritis, unspecified: Secondary | ICD-10-CM | POA: Diagnosis not present

## 2022-10-24 DIAGNOSIS — I130 Hypertensive heart and chronic kidney disease with heart failure and stage 1 through stage 4 chronic kidney disease, or unspecified chronic kidney disease: Secondary | ICD-10-CM | POA: Diagnosis not present

## 2022-10-24 DIAGNOSIS — I7143 Infrarenal abdominal aortic aneurysm, without rupture: Secondary | ICD-10-CM

## 2022-10-24 DIAGNOSIS — K219 Gastro-esophageal reflux disease without esophagitis: Secondary | ICD-10-CM | POA: Diagnosis not present

## 2022-10-24 DIAGNOSIS — Z87891 Personal history of nicotine dependence: Secondary | ICD-10-CM | POA: Diagnosis not present

## 2022-10-24 DIAGNOSIS — N1832 Chronic kidney disease, stage 3b: Secondary | ICD-10-CM | POA: Insufficient documentation

## 2022-10-24 DIAGNOSIS — T82330A Leakage of aortic (bifurcation) graft (replacement), initial encounter: Secondary | ICD-10-CM | POA: Diagnosis not present

## 2022-10-24 HISTORY — PX: IR EMBO ARTERIAL NOT HEMORR HEMANG INC GUIDE ROADMAPPING: IMG5448

## 2022-10-24 LAB — CBC
HCT: 41.3 % (ref 36.0–46.0)
Hemoglobin: 12.8 g/dL (ref 12.0–15.0)
MCH: 31.1 pg (ref 26.0–34.0)
MCHC: 31 g/dL (ref 30.0–36.0)
MCV: 100.2 fL — ABNORMAL HIGH (ref 80.0–100.0)
Platelets: 247 10*3/uL (ref 150–400)
RBC: 4.12 MIL/uL (ref 3.87–5.11)
RDW: 13.7 % (ref 11.5–15.5)
WBC: 7.3 10*3/uL (ref 4.0–10.5)
nRBC: 0 % (ref 0.0–0.2)

## 2022-10-24 LAB — BASIC METABOLIC PANEL
Anion gap: 7 (ref 5–15)
BUN: 27 mg/dL — ABNORMAL HIGH (ref 8–23)
CO2: 28 mmol/L (ref 22–32)
Calcium: 8.6 mg/dL — ABNORMAL LOW (ref 8.9–10.3)
Chloride: 101 mmol/L (ref 98–111)
Creatinine, Ser: 0.96 mg/dL (ref 0.44–1.00)
GFR, Estimated: 57 mL/min — ABNORMAL LOW (ref >60)
Glucose, Bld: 105 mg/dL — ABNORMAL HIGH (ref 70–99)
Potassium: 4.4 mmol/L (ref 3.5–5.1)
Sodium: 136 mmol/L (ref 135–145)

## 2022-10-24 LAB — PROTIME-INR
INR: 1.3 — ABNORMAL HIGH (ref 0.8–1.2)
Prothrombin Time: 16 seconds — ABNORMAL HIGH (ref 11.4–15.2)

## 2022-10-24 MED ORDER — MIDAZOLAM HCL 2 MG/2ML IJ SOLN
INTRAMUSCULAR | Status: AC | PRN
  Administered 2022-10-24 (×3): .5 mg via INTRAVENOUS

## 2022-10-24 MED ORDER — FENTANYL CITRATE (PF) 100 MCG/2ML IJ SOLN
INTRAMUSCULAR | Status: AC | PRN
  Administered 2022-10-24 (×3): 25 ug via INTRAVENOUS

## 2022-10-24 MED ORDER — LIDOCAINE-EPINEPHRINE 1 %-1:100000 IJ SOLN
INTRAMUSCULAR | Status: AC
  Filled 2022-10-24: qty 1

## 2022-10-24 MED ORDER — HYDROMORPHONE HCL 1 MG/ML IJ SOLN
INTRAMUSCULAR | Status: AC | PRN
  Administered 2022-10-24: .5 mg via INTRAVENOUS

## 2022-10-24 MED ORDER — FENTANYL CITRATE (PF) 100 MCG/2ML IJ SOLN
INTRAMUSCULAR | Status: AC
  Filled 2022-10-24: qty 2

## 2022-10-24 MED ORDER — SODIUM CHLORIDE 0.9 % IV SOLN: INTRAVENOUS | Status: DC

## 2022-10-24 MED ORDER — HYDROMORPHONE HCL 1 MG/ML IJ SOLN
INTRAMUSCULAR | Status: AC
  Filled 2022-10-24: qty 1

## 2022-10-24 MED ORDER — MIDAZOLAM HCL 2 MG/2ML IJ SOLN
INTRAMUSCULAR | Status: AC
  Filled 2022-10-24: qty 2

## 2022-10-24 MED ORDER — LIDOCAINE-EPINEPHRINE 0.5 %-1:200000 IJ SOLN
30.0000 mL | Freq: Once | INTRAMUSCULAR | Status: AC
  Administered 2022-10-24: 10 mL
  Filled 2022-10-24: qty 30

## 2022-10-24 MED ORDER — ACETAMINOPHEN 325 MG PO TABS
650.0000 mg | ORAL_TABLET | Freq: Four times a day (QID) | ORAL | Status: DC | PRN
  Administered 2022-10-24: 650 mg via ORAL
  Filled 2022-10-24: qty 2

## 2022-10-24 MED ORDER — IOHEXOL 300 MG/ML  SOLN
150.0000 mL | Freq: Once | INTRAMUSCULAR | Status: AC | PRN
  Administered 2022-10-24: 45 mL

## 2022-10-24 NOTE — Procedures (Signed)
Interventional Radiology Procedure Note  Procedure: Direct sack puncture endoleak repair  Complications: None  Estimated Blood Loss: None  Recommendations: - Bedrest x 4 hrs - DC home   Signed,  Sterling Big, MD

## 2022-10-24 NOTE — H&P (Signed)
Chief Complaint: Patient was seen in consultation today for type 2 endoleak.  Referring Physician(s): Sherald Hess, MD  Supervising Physician: Malachy Moan  Patient Status: [facility] - Out-pt  History of Present Illness: Lisa Mcclure is a 89 y.o. female with a past medical history significant for depression, GERD, OSA, RA, CKD, paroxysmal a.fib, HTN, CHF and abdominal aortic aneurysm s/p repair 12/20/19 who presents today for image guided aortogram with possible endoleak repair. [name] underwent endovascular repair of abdominal aortic aneurysm due to impending rupture in 2021. She has been followed regularly with imaging since that time and has been noted to have persistent enlargement of the excluded aneurysm sac. Most recently, the aneurysm was noted to measure 6.1 cm. She was referred to IR to discuss possible endoleak repair and after thorough discussion she has decided to proceed.  Patient seen in IR holding with Dr. Archer Asa, daughter at bedside during discussion. They express concerns about her bilateral frozen shoulders in relation to discomfort during the procedure. [name] also notes concern about the dangers of the procedures itself but after careful review of the risks and alternatives, she is ultimately agreeable to proceed.  Past Medical History:  Diagnosis Date   Acute renal failure superimposed on stage 3b chronic kidney disease (HCC) 06/15/2021   Anemia    h/o hemorrhoidal bleeding and blood transfusion   Chronic diastolic CHF (congestive heart failure) (HCC)    Depression    Diverticulosis    DOE (dyspnea on exertion)    Esophageal reflux    GERD (gastroesophageal reflux disease)    Hypothyroidism    Insomnia    LBP (low back pain)    OAB (overactive bladder)    Obesity    OSA (obstructive sleep apnea)    Osteoarthritis    Paroxysmal atrial fibrillation (HCC)    Rheumatoid arthritis(714.0)    Shoulder pain, bilateral    Unspecified  essential hypertension    Venous insufficiency     Past Surgical History:  Procedure Laterality Date   ABDOMINAL AORTIC ENDOVASCULAR STENT GRAFT N/A 12/20/2019   Procedure: Aortogram including catheter selection of aorta and bilateral iliac arteriogram, Endovascular repair of infrarenal abdominal aortic aneurysm with bifurcated stent graft (26 mm x 14 x 12 main body, right bell bottom with a 20 mm x 10 cm piece, and left bell bottom with a 16 mm x 12 cm piece) ;  Surgeon: Cephus Shelling, MD;  Location: [facility] OR;  Service: Vascular;  Laterality: N/A;   APPENDECTOMY  1953   BIOPSY  06/18/2021   Procedure: BIOPSY;  Surgeon: Kerin Salen, MD;  Location: [facility] ENDOSCOPY;  Service: Gastroenterology;;   bladder abduction-1996  1996   breast biopsy Right 1980   CARDIOVERSION N/A 12/24/2019   Procedure: CARDIOVERSION;  Surgeon: Yates Decamp, MD;  Location: [facility] ENDOSCOPY;  Service: Cardiovascular;  Laterality: N/A;   CARDIOVERSION N/A 04/25/2022   Procedure: CARDIOVERSION;  Surgeon: Yates Decamp, MD;  Location[facility] ENDOSCOPY;  Service: Cardiovascular;  Laterality: N/A;   CESAREAN SECTION     1957, 1961, 1964   COSMETIC SURGERY  1996   CYSTOCELE REPAIR     ESOPHAGOGASTRODUODENOSCOPY (EGD) WITH PROPOFOL N/A 06/18/2021   Procedure: ESOPHAGOGASTRODUODENOSCOPY (EGD) WITH PROPOFOL;  Surgeon: Kerin Salen, MD;  Location: [facility] ENDOSCOPY;  Service: Gastroenterology;  Laterality: N/A;   FLEXIBLE SIGMOIDOSCOPY N/A 04/10/2015   Procedure: FLEXIBLE SIGMOIDOSCOPY;  Surgeon: Willis Modena, MD;  Location: [facility] ENDOSCOPY;  Service: Endoscopy;  Laterality: N/A;   IR RADIOLOGIST EVAL & MGMT  09/13/2022   knee arthroscopy Right 1996, 2010   KNEE ARTHROSCOPY W/ AUTOGENOUS CARTILAGE IMPLANTATION (ACI) PROCEDURE Left 1994, 1995   REFRACTIVE SURGERY  01/2020   SHOULDER SURGERY  1990   TEE WITHOUT CARDIOVERSION N/A 12/24/2019   Procedure: TRANSESOPHAGEAL ECHOCARDIOGRAM (TEE);  Surgeon: Yates Decamp, MD;  Location: [facility] ENDOSCOPY;  Service:  Cardiovascular;  Laterality: N/A;   TEE WITHOUT CARDIOVERSION N/A 06/23/2022   Procedure: TRANSESOPHAGEAL ECHOCARDIOGRAM (TEE);  Surgeon: Clotilde Dieter, DO;  Location: [facility] ENDOSCOPY;  Service: Cardiovascular;  Laterality: N/A;   TOTAL ABDOMINAL HYSTERECTOMY  1972   ULTRASOUND GUIDANCE FOR VASCULAR ACCESS Bilateral 12/20/2019   Procedure: Ultrasound-guided access of bilateral common femoral arteries for delivery of endograft and percutaneous closure;  Surgeon: Cephus Shelling, MD;  Location: [facility] OR;  Service: Vascular;  Laterality: Bilateral;   VESICOVAGINAL FISTULA CLOSURE W/ TAH     WRIST SURGERY  1967    Allergies: Statins, Atorvastatin, Gabapentin, and Methocarbamol  Medications: Prior to Admission medications   Medication Sig Start Date End Date Taking? Authorizing Provider  ALEVE ARTHRITIS PAIN 1 % GEL Apply 2 g topically 4 (four) times daily as needed (for pain).   Yes [provider]  amiodarone (PACERONE) 100 MG tablet TAKE ONE TABLET BY MOUTH EVERY OTHER DAY AS DIRECTED 10/03/22  Yes Yates Decamp, MD  budesonide (PULMICORT) 0.5 MG/2ML nebulizer solution Take 2 mLs (0.5 mg total) by nebulization in the morning and at bedtime. Patient taking differently: Take 0.5 mg by nebulization daily. 03/22/22  Yes Glenford Bayley, NP  busPIRone (BUSPAR) 7.5 MG tablet Take 7.5 mg by mouth See admin instructions. Take 7.5 mg by mouth at bedtime and an additional 7.5 mg once a day as needed for anxiety   Yes [provider]  carvedilol (COREG) 6.25 MG tablet TAKE ONE TABLET BY MOUTH TWICE DAILY WITH A MEAL 08/30/22  Yes Yates Decamp, MD  dapagliflozin propanediol (FARXIGA) 10 MG TABS tablet Take 1 tablet (10 mg total) by mouth daily before breakfast. 01/27/22  Yes Yates Decamp, MD  diclofenac sodium (VOLTAREN) 1 % GEL Apply 2.25 g topically 3 (three) times daily as needed (for pain). 10/23/18  Yes [provider]  furosemide (LASIX) 20 MG tablet TAKE TWO TABLETS BY MOUTH ONCE  DAILY AS NEEDED fluid 10/03/22  Yes Yates Decamp, MD  HYDROcodone-acetaminophen (NORCO) 7.5-325 MG tablet Take 1 tablet by mouth every 6 (six) hours as needed for moderate pain. 04/13/15  Yes Vassie Loll, MD  ipratropium (ATROVENT) 0.03 % nasal spray Place 2 sprays into both nostrils every 12 (twelve) hours. Patient taking differently: Place 2 sprays into both nostrils every 12 (twelve) hours as needed for rhinitis. 03/22/22  Yes Glenford Bayley, NP  latanoprost (XALATAN) 0.005 % ophthalmic solution Place 1 drop into both eyes at bedtime.   Yes [provider]  levothyroxine (SYNTHROID) 125 MCG tablet Take 125 mcg by mouth daily before breakfast.    Yes [provider]  melatonin 3 MG TABS tablet Take 3 mg by mouth at bedtime.   Yes [provider]  Multiple Vitamins-Minerals (PRESERVISION AREDS 2+MULTI VIT) CAPS Take 1 capsule by mouth in the morning and at bedtime.   Yes [provider]  nortriptyline (PAMELOR) 10 MG capsule Take 10 mg by mouth daily. 08/31/22  Yes [provider]  rOPINIRole (REQUIP) 1 MG tablet Take 1 mg by mouth See admin instructions. Take 1 mg by mouth at 1 PM and 2 mg at 9 PM 06/19/15  Yes  [provider]  sacubitril-valsartan (ENTRESTO) 24-26 MG Take 1 tablet by mouth 2 (two) times daily. 06/25/22  Yes Zannie Cove, MD  SYSTANE COMPLETE PF 0.6 % SOLN Place 1 drop into both eyes 4 (four) times daily as needed (for dryness).   Yes [provider]  apixaban (ELIQUIS) 2.5 MG TABS tablet Take 1 tablet (2.5 mg total) by mouth 2 (two) times daily. Patient taking differently: Take 2.5 mg by mouth in the morning and at bedtime. 02/25/22   Yates Decamp, MD  [facility] Liver Oil CAPS Take 1 capsule by mouth daily.    [provider]  docusate sodium (COLACE) 100 MG capsule Take 200 mg by mouth at bedtime.    [provider]  methocarbamol (ROBAXIN) 500 MG tablet Take 500 mg by mouth every 6 (six) hours as needed.  08/31/22   [provider]  Semaglutide-Weight Management (WEGOVY) 0.25 MG/0.5ML SOAJ Inject 0.25 mg into the skin once a week. 09/23/22   Yates Decamp, MD     Family History  Problem Relation Age of Onset   Lung cancer Mother    COPD Father    Breast cancer Maternal Aunt    Heart attack Maternal Grandmother 36   Lung cancer Son     Social History   Socioeconomic History   Marital status: Widowed    Spouse name: Not on file   Number of children: 3   Years of education: College   Highest education level: Bachelor's degree (e.g., BA, AB, BS)  Occupational History   Occupation: Retired  Tobacco Use   Smoking status: Former    Current packs/day: 0.00    Average packs/day: 0.2 packs/day for 10.0 years (2.0 ttl pk-yrs)    Types: Cigarettes    Start date: 04/11/1974    Quit date: 04/11/1984    Years since quitting: 38.5    Passive exposure: Past   Smokeless tobacco: Never  Vaping Use   Vaping status: Never Used  Substance and Sexual Activity   Alcohol use: Yes    Alcohol/week: 0.0 standard drinks of alcohol    Comment: cocktail once a week   Drug use: No   Sexual activity: Not Currently  Other Topics Concern   Not on file  Social History Narrative   Widowed   Lives alone   3 children, 2 living   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[employer], Production designer, theatre/television/film of physician's office   Went to [place] May 2016 for 11 days   Drinks 1 cup of coffee in the morning   Social Determinants of Health   Financial Resource Strain: Low Risk  (08/11/2021)   Overall Financial Resource Strain (CARDIA)    Difficulty of Paying Living Expenses: Not hard at all  Food Insecurity: No Food Insecurity (08/23/2022)   Hunger Vital Sign    Worried About Running Out of Food in the Last Year: Never true    Ran Out of Food in the Last Year: Never true  Transportation Needs: No Transportation Needs (08/23/2022)   PRAPARE - Administrator, Civil Service (Medical): No    Lack of Transportation  (Non-Medical): No  Physical Activity: Inactive (08/11/2021)   Exercise Vital Sign    Days of Exercise per Week: 0 days    Minutes of Exercise per Session: 0 min  Stress: No Stress Concern Present (08/11/2021)   [employer] of Occupational Health - Occupational Stress Questionnaire    Feeling of Stress : Only a little  Social Connections: Moderately Isolated (08/11/2021)  Social Advertising account executive [NHANES]    Frequency of Communication with Friends and Family: More than three times a week    Frequency of Social Gatherings with Friends and Family: More than three times a week    Attends Religious Services: More than 4 times per year    Active Member of [employer] or Organizations: No    Attends Banker Meetings: Never    Marital Status: Widowed     Review of Systems: A 12 point ROS discussed and pertinent positives are indicated in the HPI above.  All other systems are negative.  Review of Systems  Constitutional:  Negative for chills and fever.  Respiratory:  Negative for cough and shortness of breath.   Cardiovascular:  Negative for chest pain.  Gastrointestinal:  Negative for abdominal pain, diarrhea, nausea and vomiting.  Musculoskeletal:  Negative for back pain.  Neurological:  Negative for dizziness and headaches.    Vital Signs: BP (!) 138/51   Pulse (!) 40   Temp 97.8 F (36.6 C) (Oral)   Resp 18   Ht 5\' 3"  (1.6 m)   Wt 195 lb (88.5 kg)   SpO2 95%   BMI 34.54 kg/m   Physical Exam Vitals reviewed.  Constitutional:      General: She is not in acute distress. HENT:     Head: Normocephalic.  Cardiovascular:     Rate and Rhythm: Normal rate and regular rhythm.     Heart sounds: Murmur heard.  Pulmonary:     Effort: Pulmonary effort is normal.     Breath sounds: Normal breath sounds.  Abdominal:     General: There is no distension.     Palpations: Abdomen is soft.     Tenderness: There is no abdominal tenderness.  Skin:    General: Skin  is warm and dry.  Neurological:     Mental Status: She is alert and oriented to person, place, and time.  Psychiatric:        Mood and Affect: Mood normal.        Behavior: Behavior normal.        Thought Content: Thought content normal.        Judgment: Judgment normal.      MD Evaluation Airway: WNL Heart: WNL Abdomen: WNL Chest/ Lungs: WNL ASA  Classification: 3 Mallampati/Airway Score: Two   Imaging: No results found.  Labs:  CBC: Recent Labs    04/24/22 1027 06/19/22 1720 06/20/22 0254 10/24/22 0706  WBC 10.2 9.4 9.8 7.3  HGB 13.3 13.8 13.5 12.8  HCT 43.3 44.7 43.4 41.3  PLT 296 250 236 247    COAGS: Recent Labs    10/24/22 0706  INR 1.3*    BMP: Recent Labs    06/22/22 0032 06/23/22 0044 06/24/22 0040 06/28/22 1313 09/01/22 1120 10/24/22 0706  NA 136 135 134* 140  --  136  K 3.6 3.7 4.1 4.9  --  4.4  CL 94* 95* 93* 98  --  101  CO2 34* 31 30 28   --  28  GLUCOSE 112* 149* 123* 84  --  105*  BUN 21 24* 34* 36*  --  27*  CALCIUM 8.4* 8.6* 8.5* 8.9  --  8.6*  CREATININE 1.11* 1.06* 1.67* 1.08* 0.90 0.96  GFRNONAA 48* 51* 29*  --   --  57*    LIVER FUNCTION TESTS: Recent Labs    06/19/22 1720  BILITOT 0.5  AST 40  ALT 35  ALKPHOS  89  PROT 7.6  ALBUMIN 3.5    TUMOR MARKERS: No results for input(s): "AFPTM", "CEA", "CA199", "CHROMGRNA" in the last 8760 hours.  Assessment and Plan:  89 y/o F with history of abdominal aortic aneurysm s/p endovascular repair 12/2019 with vascular surgery found to have worsening type II endoleak who presents today for image guided aortogram with possible endoleak repair today under moderate anesthesia.  Risks and benefits of aortic arteriogram with intervention were discussed with the patient including, but not limited to bleeding, infection, vascular injury, contrast induced renal failure, stroke, reperfusion hemorrhage, or even death. This interventional procedure involves the use of X-rays and because  of the nature of the planned procedure, it is possible that we will have prolonged use of X-ray fluoroscopy. Potential radiation risks to you include (but are not limited to) the following: - A slightly elevated risk for cancer  several years later in life. This risk is typically less than 0.5% percent. This risk is low in comparison to the normal incidence of human cancer, which is 33% for women and 50% for men according to the American Cancer Society. - Radiation induced injury can include skin redness, resembling a rash, tissue breakdown / ulcers and hair loss (which can be temporary or permanent).  The likelihood of either of these occurring depends on the difficulty of the procedure and whether you are sensitive to radiation due to previous procedures, disease, or genetic conditions.  IF your procedure requires a prolonged use of radiation, you will be notified and given written instructions for further action.  It is your responsibility to monitor the irradiated area for the 2 weeks following the procedure and to notify your physician if you are concerned that you have suffered a radiation induced injury.    All of the patient's questions were answered, patient is agreeable to proceed.  Consent signed and in chart.  Thank you for this interesting consult.  I greatly enjoyed meeting Lisa Mcclure and look forward to participating in their care.  A copy of this report was sent to the requesting provider on this date.  Electronically Signed: Villa Herb, PA-C 10/24/2022, 8:17 AM   I spent a total of 30 Minutes  in face to face in clinical consultation, greater than 50% of which was counseling/coordinating care for endoleak.

## 2022-10-26 ENCOUNTER — Other Ambulatory Visit: Payer: Self-pay | Admitting: Interventional Radiology

## 2022-10-26 ENCOUNTER — Encounter (HOSPITAL_COMMUNITY): Payer: Self-pay

## 2022-10-26 ENCOUNTER — Telehealth: Payer: Self-pay

## 2022-10-26 DIAGNOSIS — I9789 Other postprocedural complications and disorders of the circulatory system, not elsewhere classified: Secondary | ICD-10-CM

## 2022-10-26 HISTORY — PX: IR AORTAGRAM ABDOMINAL SERIALOGRAM: IMG636

## 2022-10-26 HISTORY — PX: IR US GUIDE VASC ACCESS LEFT: IMG2389

## 2022-10-26 NOTE — Telephone Encounter (Signed)
Pt aware.

## 2022-10-26 NOTE — Telephone Encounter (Signed)
Stop carvedilol

## 2022-10-26 NOTE — Telephone Encounter (Signed)
Pt calling for low  heart rate 38, she feels tired also not able to take blood pressure will check you please call her

## 2022-11-01 ENCOUNTER — Encounter: Payer: Self-pay | Admitting: Cardiology

## 2022-11-01 ENCOUNTER — Other Ambulatory Visit: Payer: Self-pay | Admitting: Cardiology

## 2022-11-01 DIAGNOSIS — I5032 Chronic diastolic (congestive) heart failure: Secondary | ICD-10-CM

## 2022-11-01 DIAGNOSIS — I480 Paroxysmal atrial fibrillation: Secondary | ICD-10-CM

## 2022-11-01 DIAGNOSIS — I5033 Acute on chronic diastolic (congestive) heart failure: Secondary | ICD-10-CM

## 2022-11-01 NOTE — Telephone Encounter (Signed)
Pt called stating that after stopping carvedilol her resting HR is ranging 85-90, short winded and can feel heart pounding. Wants to know if she should restart carvedilol or please advise.

## 2022-11-01 NOTE — Progress Notes (Signed)
ICD-10-CM   1. Paroxysmal atrial fibrillation (HCC)  I48.0 carvedilol (COREG) 6.25 MG tablet    2. Acute on chronic diastolic heart failure (HCC)  Z61.09     3. Chronic diastolic (congestive) heart failure (HCC)  I50.32 carvedilol (COREG) 6.25 MG tablet     Restart Coreg 6.25 mg 1/2 tab BID

## 2022-11-01 NOTE — Telephone Encounter (Signed)
Restart Coreg 6.25 mg 1/2 tab twice daily

## 2022-11-02 ENCOUNTER — Ambulatory Visit: Payer: Self-pay

## 2022-11-02 NOTE — Telephone Encounter (Signed)
Patient called back and was informed about the message above.

## 2022-11-02 NOTE — Telephone Encounter (Signed)
Called patient no answer left a vm

## 2022-11-02 NOTE — Patient Outreach (Signed)
  Care Coordination   Follow Up Visit Note   11/02/2022 Name: Lisa Mcclure MRN: [id number] DOB: November 19, 1932  Lisa Mcclure is a 89 y.o. year old female who sees Garlan Fillers, MD for primary care. I spoke with  Lisa Mcclure by phone today.  What matters to the patients health and wellness today?  Patient would like to check getting Meals on Wheels.     Goals Addressed               This Visit's Progress     Patient Stated     I have started a new medication for my heart failure (pt-stated)        Care Coordination Interventions: Evaluation of current treatment plan related to  type II endoleak following endovascular aortic repair of an abdominal aortic aneurysm and patient's adherence to plan as established by provider Confirmed patient underwent her procedure last week, she has a home health nurse present, requesting a call back, would like status on her MOW    Interventions Today    Flowsheet Row Most Recent Value  General Interventions   General Interventions Discussed/Reviewed General Interventions Discussed, General Interventions Reviewed, Communication with  Communication with Social Work  Lisa Mcclure BSW]  Nutrition Interventions   Nutrition Discussed/Reviewed Nutrition Discussed, Nutrition Reviewed  [patient requesting follow up on MOW status]          SDOH assessments and interventions completed:  No     Care Coordination Interventions:  Yes, provided   Follow up plan: Follow up call scheduled for 11/03/22 @10  AM    Encounter Outcome:  Pt. Visit Completed

## 2022-11-02 NOTE — Patient Instructions (Signed)
Visit Information  Thank you for taking time to visit with me today. Please don't hesitate to contact me if I can be of assistance to you.   Following are the goals we discussed today:   Goals Addressed               This Visit's Progress     Patient Stated     I have started a new medication for my heart failure (pt-stated)        Care Coordination Interventions: Evaluation of current treatment plan related to  type II endoleak following endovascular aortic repair of an abdominal aortic aneurysm and patient's adherence to plan as established by provider Confirmed patient underwent her procedure last week, she has a home health nurse present, requesting a call back, would like status on her MOW        Our next appointment is by telephone on 11/03/22 at 10:00 AM  Please call the care guide team at [phone] if you need to cancel or reschedule your appointment.   If you are experiencing a Mental Health or Behavioral Health Crisis or need someone to talk to, please call 1-800-273-TALK (toll free, 24 hour hotline)  Patient verbalizes understanding of instructions and care plan provided today and agrees to view in MyChart. Active MyChart status and patient understanding of how to access instructions and care plan via MyChart confirmed with patient.     Delsa Sale, RN, BSN, CCM Care Management Coordinator [facility] Care Management  Direct Phone: [phone]

## 2022-11-03 ENCOUNTER — Encounter: Payer: Self-pay | Admitting: Cardiology

## 2022-11-03 ENCOUNTER — Ambulatory Visit: Payer: Self-pay

## 2022-11-03 ENCOUNTER — Ambulatory Visit: Payer: Medicare Other | Admitting: Cardiology

## 2022-11-03 VITALS — BP 152/99 | HR 83 | Resp 16 | Ht 63.0 in | Wt 206.0 lb

## 2022-11-03 DIAGNOSIS — I10 Essential (primary) hypertension: Secondary | ICD-10-CM | POA: Diagnosis not present

## 2022-11-03 DIAGNOSIS — I480 Paroxysmal atrial fibrillation: Secondary | ICD-10-CM

## 2022-11-03 DIAGNOSIS — I5033 Acute on chronic diastolic (congestive) heart failure: Secondary | ICD-10-CM

## 2022-11-03 MED ORDER — CARVEDILOL 3.125 MG PO TABS
3.1250 mg | ORAL_TABLET | Freq: Two times a day (BID) | ORAL | 1 refills | Status: DC
Start: 2022-11-03 — End: 2022-12-22

## 2022-11-03 NOTE — Patient Outreach (Addendum)
  Care Coordination   Follow Up Visit Note   11/03/2022 Name: Lisa Mcclure MRN: [id number] DOB: Feb 18, 1933  Lisa Mcclure is a 89 y.o. year old female who sees Lisa Fillers, MD for primary care. I spoke with  Lisa Mcclure by phone today.  What matters to the patients health and wellness today?  Patient would like to f/u with her Cardiologist today for evaluation of abnormal heart beat and shortness of breath.     Goals Addressed               This Visit's Progress     Patient Stated     I have started a new medication for my heart failure (pt-stated)        Care Coordination Interventions: Reviewed Heart Failure Action Plan in depth and provided written copy Reviewed role of diuretics in prevention of fluid overload and management of heart failure; Discussed the importance of keeping all appointments with provider Reviewed medications with patient and discussed importance of medication adherence Discussed with patient she contacted Lisa Mcclure office due to having an abnormal heart rate and shortness of breath, she is scheduled to see him in person today at 2:30 PM for evaluation of her symptoms     Interventions Today    Flowsheet Row Most Recent Value  Chronic Disease   Chronic disease during today's visit Congestive Heart Failure (CHF)  General Interventions   General Interventions Discussed/Reviewed General Interventions Reviewed, General Interventions Discussed, Doctor Visits, Communication with  Doctor Visits Discussed/Reviewed Doctor Visits Reviewed, Doctor Visits Discussed, Specialist  Communication with Social Work  Lisa Mcclure Lisa Mcclure BSW to assist with [facility]]  Education Interventions   Education Provided Provided Education  Provided Verbal Education On When to see the doctor, Medication  Pharmacy Interventions   Pharmacy Dicussed/Reviewed Pharmacy Topics Discussed, Pharmacy Topics Reviewed, Medications and their functions          SDOH  assessments and interventions completed:  No     Care Coordination Interventions:  Yes, provided   Follow up plan: Referral made to Lisa Mcclure BSW to assist with Meals on Wheels  Follow up call scheduled for 11/14/22 @1 :30 PM    Encounter Outcome:  Pt. Visit Completed

## 2022-11-03 NOTE — Patient Outreach (Signed)
  Care Coordination   Documentation  Note   11/03/2022 Name: Lisa Mcclure MRN: [id number] DOB: [birth year]  Lisa Mcclure is a 89 y.o. year old female who sees Garlan Fillers, MD for primary care. I  collaborated with Chartered certified accountant Little who requests SW assistance with patient referral to [employer] [facility] [employer]. SW placed referral to [employer] [facility] [place] via [employer].   What matters to the patients health and wellness today?  SW did not speak with the patient today.    SDOH assessments and interventions completed:  No     Care Coordination Interventions:  Yes, provided   Interventions Today    Flowsheet Row Most Recent Value  Chronic Disease   Chronic disease during today's visit Hypertension (HTN)  General Interventions   General Interventions Discussed/Reviewed [facility], Communication with  [employer] with Medical illustrator who requests SW assistance with referring patient to [facility]. Referral placed via [email or web address] to [facility]]  Communication with RN        Follow up plan:  Patient will remain engaged with Care Coordination team.    Encounter Outcome:  Pt. Visit Completed   Bevelyn Ngo, Kenard Gower, CDP Social Worker, Certified Dementia Practitioner [facility] Care Management  Care Coordination [phone]

## 2022-11-03 NOTE — Patient Instructions (Signed)
Visit Information  Thank you for taking time to visit with me today. Please don't hesitate to contact me if I can be of assistance to you.   Following are the goals we discussed today:   Goals Addressed               This Visit's Progress     Patient Stated     I have started a new medication for my heart failure (pt-stated)        Care Coordination Interventions: Reviewed Heart Failure Action Plan in depth and provided written copy Reviewed role of diuretics in prevention of fluid overload and management of heart failure; Discussed the importance of keeping all appointments with provider Reviewed medications with patient and discussed importance of medication adherence Discussed with patient she contacted Dr. Verl Dicker office due to having an abnormal heart rate and shortness of breath, she is scheduled to see him in person today at 2:30 PM for evaluation of her symptoms            Our next appointment is by telephone on 11/14/22 at 1:30 PM  Please call the care guide team at [phone] if you need to cancel or reschedule your appointment.   If you are experiencing a Mental Health or Behavioral Health Crisis or need someone to talk to, please call 1-800-273-TALK (toll free, 24 hour hotline)  Patient verbalizes understanding of instructions and care plan provided today and agrees to view in MyChart. Active MyChart status and patient understanding of how to access instructions and care plan via MyChart confirmed with patient.     Delsa Sale, RN, BSN, CCM Care Management Coordinator [facility] Care Management  Direct Phone: [phone]

## 2022-11-03 NOTE — Progress Notes (Signed)
Primary Physician/Referring:  Garlan Fillers, MD  [name], female    DOB: 1932-05-01, 89 y.o.   MRN: [id number]  Chief Complaint  Patient presents with   Congestive Heart Failure   Follow-up    3 month   HPI:    Lisa Mcclure  is a 89 y.o. female  with  obesity, chronic diastolic CHF, hypertensive heart disease and chronic 3A-B kidney disease, paroxysmal atrial fibrillation,  anemia secondary to hemorrhoids and diverticulosis, esophageal reflux, depression, OSA intolerant to CPAP on nocturnal 02, rheumatoid arthritis, and venous insufficiency, aortic and coronary calcification, s/p AAA repair on 12/20/2019. Patient underwent pseudoaneurysm sac repair of the AAA on 10/24/2022 and repair of the endoleak. Dry weight 195 Lbs  Patient presents to the office post hospital follow-up of heart failure (1/12 through 04/25/2022), again admitted on 06/19/2022 with acute pulm edema, acute on chronic diastolic heart failure, diuresed close to 6 to 7 L of fluid and developed acute renal failure and discharged home on low-dose Entresto and also on Farxiga.   She called and made an appointment to see me as her heart rate had dipped down to 30 bpm.  There is no dizziness or syncope.  Her weight is slightly gone up since recent procedure to repair her endoleak by radiology.  Dyspnea has remained stable.  Leg edema is mild. She has home health and home physical therapy as well.   Past Medical History:  Diagnosis Date   Acute renal failure superimposed on stage 3b chronic kidney disease (HCC) 06/15/2021   Anemia    h/o hemorrhoidal bleeding and blood transfusion   Chronic diastolic CHF (congestive heart failure) (HCC)    Depression    Diverticulosis    DOE (dyspnea on exertion)    Esophageal reflux    GERD (gastroesophageal reflux disease)    Hypothyroidism    Insomnia    LBP (low back pain)    OAB (overactive bladder)    Obesity    OSA (obstructive sleep apnea)     Osteoarthritis    Paroxysmal atrial fibrillation (HCC)    Rheumatoid arthritis(714.0)    Shoulder pain, bilateral    Unspecified essential hypertension    Venous insufficiency    Social History   Tobacco Use   Smoking status: Former    Current packs/day: 0.00    Average packs/day: 0.2 packs/day for 10.0 years (2.0 ttl pk-yrs)    Types: Cigarettes    Start date: 04/11/1974    Quit date: 04/11/1984    Years since quitting: 38.5    Passive exposure: Past   Smokeless tobacco: Never  Substance Use Topics   Alcohol use: Yes    Alcohol/week: 0.0 standard drinks of alcohol    Comment: cocktail once a week   Marital Status: Widowed  ROS  Review of Systems  Constitutional: Negative for weight gain.  Cardiovascular:  Positive for dyspnea on exertion. Negative for chest pain, leg swelling, orthopnea, palpitations and syncope.  Genitourinary:  Negative for bladder incontinence.   Objective  Blood pressure (!) 152/99, pulse 83, resp. rate 16, height 5\' 3"  (1.6 m), weight 206 lb (93.4 kg), SpO2 92%.     11/03/2022    2:30 PM 10/24/2022    2:00 PM 10/24/2022    1:45 PM  Vitals with BMI  Height 5\' 3"     Weight 206 lbs    BMI 36.5    Systolic 152 120 528  Diastolic 99 44 45  Pulse 83 74  72     Physical Exam Vitals reviewed.  Constitutional:      Appearance: She is well-developed. She is obese.  Neck:     Vascular: Carotid bruit present. No JVD.  Cardiovascular:     Rate and Rhythm: Normal rate and regular rhythm.     Pulses: Intact distal pulses.     Heart sounds: S1 normal and S2 normal. Murmur heard.     Harsh midsystolic murmur is present with a grade of 3/6 at the upper right sternal border radiating to the apex.     No gallop.  Pulmonary:     Effort: Pulmonary effort is normal. No accessory muscle usage.     Breath sounds: Rales (bases) present.  Abdominal:     General: Bowel sounds are normal.     Palpations: Abdomen is soft.  Musculoskeletal:     Right lower leg: Edema  (trace) present.     Left lower leg: Edema (trace) present.    Laboratory examination:   Recent Labs    06/23/22 0044 06/24/22 0040 06/28/22 1313 09/01/22 1120 10/24/22 0706  NA 135 134* 140  --  136  K 3.7 4.1 4.9  --  4.4  CL 95* 93* 98  --  101  CO2 31 30 28   --  28  GLUCOSE 149* 123* 84  --  105*  BUN 24* 34* 36*  --  27*  CREATININE 1.06* 1.67* 1.08* 0.90 0.96  CALCIUM 8.6* 8.5* 8.9  --  8.6*  GFRNONAA 51* 29*  --   --  57*      Latest Ref Rng & Units 10/24/2022    7:06 AM 09/01/2022   11:20 AM 06/28/2022    1:13 PM  CMP  Glucose 70 - 99 mg/dL 725   84   BUN 8 - 23 mg/dL 27   36   Creatinine 3.66 - 1.00 mg/dL 4.40  3.47  4.25   Sodium 135 - 145 mmol/L 136   140   Potassium 3.5 - 5.1 mmol/L 4.4   4.9   Chloride 98 - 111 mmol/L 101   98   CO2 22 - 32 mmol/L 28   28   Calcium 8.9 - 10.3 mg/dL 8.6   8.9       Latest Ref Rng & Units 10/24/2022    7:06 AM 06/20/2022    2:54 AM 06/19/2022    5:20 PM  CBC  WBC 4.0 - 10.5 K/uL 7.3  9.8  9.4   Hemoglobin 12.0 - 15.0 g/dL 95.6  38.7  56.4   Hematocrit 36.0 - 46.0 % 41.3  43.4  44.7   Platelets 150 - 400 K/uL 247  236  250    Lipid Panel    Component Value Date/Time   CHOL 126 03/14/2019 1121   TRIG 82 03/14/2019 1121   HDL 49 03/14/2019 1121   CHOLHDL 5.1 CALC 12/19/2006 0916   VLDL 26 12/19/2006 0916   LDLCALC 61 03/14/2019 1121   LDLDIRECT 159.2 12/19/2006 0916    Lab Results  Component Value Date   TSH 1.195 06/19/2022    BNP (last 3 results) Recent Labs    04/23/22 0030 04/24/22 0050 06/19/22 1720  BNP 1,056.8* 855.1* 1,428.4*   ProBNP (last 3 results) Recent Labs    06/28/22 1313  PROBNP 583   Radiology:   CT Abdomen and Pelvis W Contrast 12/20/2019: 1. Large Infrarenal Abdominal Aortic Aneurysm measuring up to 5.7 cm with mild perianeurysmal inflammation. Differential considerations  include impending AAA rupture, mycotic AAA, symptomatic AAA. Recommend urgent transfer to emergency  department and Vascular Surgery consultation. Aortic aneurysm NOS (ICD10-I71.9). Aortic Atherosclerosis (ICD10-I70.0). 2. No other acute or inflammatory process identified. 5 cm fat containing left pelvic hernia. Large bowel diverticulosis. 3. Simple appearing 4 cm left ovarian cyst, recommend follow-up Ultrasound in 6-12 months  Cardiac Studies:   Lexiscan myoview stress test 12/08/2017: 1. Lexiscan stress test was performed. Exercise capacity was not assessed. Stress symptoms included abdominal pain. Blood pressure was normal. The resting and stress electrocardiogram demonstrated normal sinus rhythm, normal resting conduction, no resting arrhythmias and normal rest repolarization. 2. The overall quality of the study is good. There is no evidence of abnormal lung activity. Stress and rest SPECT images demonstrate homogeneous tracer distribution throughout the myocardium. Gated SPECT imaging reveals normal myocardial thickening and wall motion. The left ventricular ejection fraction was normal (54%). 3. Low risk study  Echocardiogram 06/20/2022:  1. Left ventricular ejection fraction, by estimation, is 60 to 65%. The left ventricle has normal function. The left ventricle has no regional wall motion abnormalities. There is moderate concentric left ventricular hypertrophy. Left ventricular  diastolic parameters are consistent with Grade I diastolic dysfunction (impaired relaxation). Elevated left atrial pressure.  2. Right ventricular systolic function is normal. The right ventricular size is normal.  3. Left atrial size was moderately dilated.  4. The mitral valve is normal in structure. No evidence of mitral valve regurgitation. No evidence of mitral stenosis.  5. The aortic valve is severely calcified with restricted leaflet excursion. Visually the valve appears severely stenotic. The aortic valve mean gradient is with a peak gradient of . AVA by VTI of 0.56cm2. Aortic valve mean  gradient  measures 18.0 mmHg. Aortic valve peak gradient measures 30.9 mmHg. Aortic valve area, by VTI measures 0.84 cm. DI 0.33.  Compared to 02/08/1922, aortic valve mean gradient was 23 mmHg.  6. The inferior vena cava is dilated in size with >50% respiratory variability, suggesting right atrial pressure of 8 mmHg.  Renal artery duplex  06/21/2022: Right: 1-59% stenosis of the right renal artery. Abnormal right  Resistive Index. Normal size right kidney.  Left:  1-59% stenosis of the left renal artery. Abnormal left Resisitve Index. Normal size of left kidney.  Right kidney length (cm)10.12, left kidney length (cm)10.07   TEE 06/23/2022:  1. Left ventricular ejection fraction, by estimation, is 60 to 65%. The left ventricle has normal function. The left ventricle has no regional wall motion abnormalities.  2. Right ventricular systolic function is normal. The right ventricular size is normal.  3. Left atrial size was mild to moderately dilated. No left atrial/left atrial appendage thrombus was detected.  4. The mitral valve was not assessed. No evidence of mitral valve regurgitation.  5. The aortic valve is calcified. Aortic valve regurgitation is trivial. Moderate aortic valve stenosis.  Peak velocity 3 m/s. Surface probe used: Vmax 3.03 m/s. Vmean 2.21 m/s. Max peak gradient 37 mmHg. Mean gradient 22 mmHg. VTI 71.7  EKG   EKG 04/10/2023: Sinus rhythm with first-degree AV block at the rate of 66 bpm, normal axis, poor R progression, probably normal variant however cannot exclude anteroseptal infarct old.  Single PVC.  Compared to 05/04/2022, no change.  Allergies & Medications   Allergies  Allergen Reactions   Statins Other (See Comments)    Muscle aches and INTERNAL BLEEDING   Atorvastatin Other (See Comments)    Myalgias   Gabapentin Other (See Comments)    "  Made me loopy"   Methocarbamol Other (See Comments)    "Made me loopy"    Current Outpatient Medications:    ALEVE  ARTHRITIS PAIN 1 % GEL, Apply 2 g topically 4 (four) times daily as needed (for pain)., Disp: , Rfl:    amiodarone (PACERONE) 100 MG tablet, TAKE ONE TABLET BY MOUTH EVERY OTHER DAY AS DIRECTED, Disp: 45 tablet, Rfl: 0   apixaban (ELIQUIS) 2.5 MG TABS tablet, Take 1 tablet (2.5 mg total) by mouth 2 (two) times daily. (Patient taking differently: Take 2.5 mg by mouth in the morning and at bedtime.), Disp: 180 tablet, Rfl: 3   Biotin-Vitamin C (HAIR SKIN NAILS GUMMIES) 1250-50 MCG-MG CHEW, 2 gummies Orally once a day, Disp: , Rfl:    budesonide (PULMICORT) 0.5 MG/2ML nebulizer solution, Take 2 mLs (0.5 mg total) by nebulization in the morning and at bedtime. (Patient taking differently: Take 0.5 mg by nebulization daily.), Disp: 120 mL, Rfl: 1   busPIRone (BUSPAR) 7.5 MG tablet, Take 7.5 mg by mouth See admin instructions. Take 7.5 mg by mouth at bedtime and an additional 7.5 mg once a day as needed for anxiety, Disp: , Rfl:    dapagliflozin propanediol (FARXIGA) 10 MG TABS tablet, Take 1 tablet (10 mg total) by mouth daily before breakfast., Disp: 30 tablet, Rfl: 6   diclofenac sodium (VOLTAREN) 1 % GEL, Apply 2.25 g topically 3 (three) times daily as needed (for pain)., Disp: , Rfl:    furosemide (LASIX) 20 MG tablet, TAKE TWO TABLETS BY MOUTH ONCE DAILY AS NEEDED fluid, Disp: 90 tablet, Rfl: 0   HYDROcodone-acetaminophen (NORCO) 7.5-325 MG tablet, Take 1 tablet by mouth every 6 (six) hours as needed for moderate pain., Disp: 20 tablet, Rfl: 0   ipratropium (ATROVENT) 0.03 % nasal spray, Place 2 sprays into both nostrils every 12 (twelve) hours. (Patient taking differently: Place 2 sprays into both nostrils every 12 (twelve) hours as needed for rhinitis.), Disp: 30 mL, Rfl: 1   latanoprost (XALATAN) 0.005 % ophthalmic solution, Place 1 drop into both eyes at bedtime., Disp: , Rfl:    levothyroxine (SYNTHROID) 125 MCG tablet, Take 125 mcg by mouth daily before breakfast. , Disp: , Rfl:    melatonin 3 MG  TABS tablet, Take 3 mg by mouth at bedtime., Disp: , Rfl:    Multiple Vitamins-Minerals (PRESERVISION AREDS 2 PO), Take by mouth., Disp: , Rfl:    nortriptyline (PAMELOR) 10 MG capsule, Take 10 mg by mouth daily., Disp: , Rfl:    rOPINIRole (REQUIP) 1 MG tablet, Take 1 mg by mouth See admin instructions. Take 1 mg by mouth at 1 PM and 2 mg at 9 PM, Disp: , Rfl: 12   sacubitril-valsartan (ENTRESTO) 24-26 MG, Take 1 tablet by mouth 2 (two) times daily., Disp: , Rfl: 0   SYSTANE COMPLETE PF 0.6 % SOLN, Place 1 drop into both eyes 4 (four) times daily as needed (for dryness)., Disp: , Rfl:    carvedilol (COREG) 3.125 MG tablet, Take 1 tablet (3.125 mg total) by mouth 2 (two) times daily with a meal., Disp: 180 tablet, Rfl: 1   methocarbamol (ROBAXIN) 500 MG tablet, Take 500 mg by mouth every 6 (six) hours as needed. (Patient not taking: Reported on 11/03/2022), Disp: , Rfl:    Assessment     ICD-10-CM   1. Acute on chronic diastolic heart failure (HCC)  Z61.09     2. Paroxysmal atrial fibrillation (HCC)  I48.0 carvedilol (COREG) 3.125 MG tablet  3. Primary hypertension  I10 carvedilol (COREG) 3.125 MG tablet     This patients CHA2DS2-VASc Score 6 (CHF, HTN, vasc, age, F) and yearly risk of stroke 9.8%.  Meds ordered this encounter  Medications   carvedilol (COREG) 3.125 MG tablet    Sig: Take 1 tablet (3.125 mg total) by mouth 2 (two) times daily with a meal.    Dispense:  180 tablet    Refill:  1    This prescription was filled on 08/08/2022. Any refills authorized will be placed on file.   Medications Discontinued During This Encounter  Medication Reason   Cod Liver Oil CAPS    docusate sodium (COLACE) 100 MG capsule    Multiple Vitamins-Minerals (PRESERVISION AREDS 2+MULTI VIT) CAPS    Semaglutide-Weight Management (WEGOVY) 0.25 MG/0.5ML SOAJ Cost of medication   carvedilol (COREG) 6.25 MG tablet Reorder     Recommendations:   Lisa Mcclure  is a 89 y.o.female  with   obesity, chronic diastolic CHF, hypertensive heart disease and chronic 3A-B kidney disease, paroxysmal atrial fibrillation,  anemia secondary to hemorrhoids and diverticulosis, esophageal reflux, depression, OSA intolerant to CPAP on nocturnal 02, rheumatoid arthritis, and venous insufficiency, aortic and coronary calcification, s/p AAA repair on 12/20/2019. Patient underwent pseudoaneurysm sac repair of the AAA on 10/24/2022 and repair of the endoleak. Dry weight 195 Lbs  1. Acute on chronic diastolic heart failure (HCC) Patient is in acute diastolic heart failure probably precipitated by contrast use and also hospitalization, advised her to start taking Lasix twice daily for the next 3 days then 1 pill once a day until she gets back to her dry weight of 195 pounds and then she can switch back to taking Lasix every other day.  She is also on Farxiga 10 mg daily which she is tolerating.  2. Paroxysmal atrial fibrillation [facility]) Patient had called stating that her heart rate had dipped down to 30s to 40s, I reduced the dose of the carvedilol from 6.25 mg to 3.25 mg which she is tolerating.  She is also tolerating low-dose of amiodarone and by auscultation she appears to be maintaining sinus rhythm.  Heart rate appears to be stable around 75 to 80 bpm.  She is also tolerating anticoagulation without bleeding diathesis. - carvedilol (COREG) 3.125 MG tablet; Take 1 tablet (3.125 mg total) by mouth 2 (two) times daily with a meal.  Dispense: 180 tablet; Refill: 1  3. Primary hypertension Blood pressure was elevated today however blood pressure at home has been high and low, with the initiation of Lasix therapy, I do not want her to make hypotensive.  Will continue with present dose of the Entresto and also Lasix and carvedilol at 3.25 mg p.o. twice daily.  I will see her back in 6 weeks for follow-up.     Yates Decamp, MD, [facility] 11/03/2022, 3:06 PM Office: [phone] Fax: [phone] Pager: [phone]

## 2022-11-04 NOTE — Patient Outreach (Signed)
  Care Coordination   Documentation  Note   11/04/2022 Name: Lisa Mcclure MRN: [id number] DOB: Jul 12, 1932  Lisa Mcclure is a 89 y.o. year old female who sees Garlan Fillers, MD for primary care. I collaborated with Publishing rights manager to follow up on the status of patients meals on wheels referral which was placed via [employer] platform as it has not yet been acted on.  What matters to the patients health and wellness today?  SW did not speak with the patient.    SDOH assessments and interventions completed:  No     Care Coordination Interventions:  Yes, provided   Interventions Today    Flowsheet Row Most Recent Value  Chronic Disease   Chronic disease during today's visit Other  [Interest in receiving meals on wheels]  General Interventions   General Interventions Discussed/Reviewed [facility], Communication with  [Outbound call placed to [employer] [facility] [employer] requesting confirmation patients referral to meals on wheels has been received. Referral previously placed on [email or web address] platform has yet to be acted on]       Follow up plan:  SW will follow up with [facility] over the next week to confirm receipt of referral.    Encounter Outcome:  Pt. Visit Completed   Bevelyn Ngo, Kenard Gower, CDP Social Worker, Certified Dementia Practitioner [facility] Care Management  Care Coordination [phone]

## 2022-11-07 ENCOUNTER — Other Ambulatory Visit: Payer: Self-pay | Admitting: Interventional Radiology

## 2022-11-07 DIAGNOSIS — I9789 Other postprocedural complications and disorders of the circulatory system, not elsewhere classified: Secondary | ICD-10-CM

## 2022-11-07 DIAGNOSIS — I442 Atrioventricular block, complete: Secondary | ICD-10-CM

## 2022-11-08 NOTE — Patient Outreach (Signed)
  Care Coordination   Documentation  Note   11/08/2022 Name: Lisa Mcclure MRN: [id number] DOB: 1932-11-09  Lisa Mcclure is a 89 y.o. year old female who sees Lisa Fillers, MD for primary care. I collaborated with Lisa Mcclure, Nutrition Director with Lisa Mcclure to follow up on status of patients referral placed via [employer] for meals on wheels.  SDOH assessments and interventions completed:  No     Care Coordination Interventions:  Yes, provided   Interventions Today    Flowsheet Row Most Recent Value  Chronic Disease   Chronic disease during today's visit Other  [Interest in receiving mobile meals]  General Interventions   General Interventions Discussed/Reviewed Communication with, Lisa Mcclure  [Inbound call from [employer], Interior and spatial designer of Nutrition with [employer]. Discussed Lisa Mcclure is actively working to obtain licensure to access referrals via [id number]. Wat list remains up to 1 year]        Follow up plan:  SW will continue to follow    Encounter Outcome:  Pt. Visit Completed   Lisa Mcclure, Lisa Mcclure, CDP Social Worker, Certified Dementia Practitioner [facility] Care Management  Care Coordination [phone]

## 2022-11-14 ENCOUNTER — Ambulatory Visit: Payer: Self-pay

## 2022-11-14 NOTE — Patient Outreach (Signed)
  Care Coordination   Follow Up Visit Note   11/14/2022 Name: Lisa Mcclure MRN: [id number] DOB: May 31, 1932  Lisa Mcclure is a 89 y.o. year old female who sees Lisa Fillers, MD for primary care. I spoke with  Lisa Mcclure by phone today.  What matters to the patients health and wellness today?  Patient is concerned about persistent weight gain. She will contact her Cardiologist to report symptoms.     Goals Addressed               This Visit's Progress     Patient Stated     I have started a new medication for my heart failure (pt-stated)        Care Coordination Interventions: Reviewed Heart Failure Action Plan in depth and provided written copy Reviewed role of diuretics in prevention of fluid overload and management of heart failure Reviewed medications with patient and discussed Cardiology recommendations for use of diuretics following completed visit on 11/03/22 Determined patient's weight is down from 207 lbs since last f/u with Lisa Mcclure, today's weight is 202 lbs with a dry weight of 195 lbs  Instructed patient to contact Lisa Mcclure to report persistent increased weight and edema for instructions on diuretic use, patient verbalizes understanding and is agreeable  Reiterated importance of adhering to low Sodium diet    Interventions Today    Flowsheet Row Most Recent Value  Chronic Disease   Chronic disease during today's visit Congestive Heart Failure (CHF)  General Interventions   General Interventions Discussed/Reviewed General Interventions Discussed, General Interventions Reviewed, Doctor Visits  Doctor Visits Discussed/Reviewed Doctor Visits Discussed, Doctor Visits Reviewed, Specialist, PCP  Education Interventions   Education Provided Provided Education  Provided Verbal Education On When to see the doctor, Medication, Nutrition  Nutrition Interventions   Nutrition Discussed/Reviewed Nutrition Discussed, Nutrition Reviewed, Fluid intake,  Decreasing salt  Pharmacy Interventions   Pharmacy Dicussed/Reviewed Pharmacy Topics Discussed, Pharmacy Topics Reviewed, Medications and their functions          SDOH assessments and interventions completed:  No     Care Coordination Interventions:  Yes, provided   Follow up plan: Follow up call scheduled for 11/28/22 @12 :30 PM     Encounter Outcome:  Pt. Visit Completed

## 2022-11-14 NOTE — Patient Instructions (Signed)
Visit Information  Thank you for taking time to visit with me today. Please don't hesitate to contact me if I can be of assistance to you.   Following are the goals we discussed today:   Goals Addressed               This Visit's Progress     Patient Stated     I have started a new medication for my heart failure (pt-stated)        Care Coordination Interventions: Reviewed Heart Failure Action Plan in depth and provided written copy Reviewed role of diuretics in prevention of fluid overload and management of heart failure Reviewed medications with patient and discussed Cardiology recommendations for use of diuretics following completed visit on 11/03/22 Determined patient's weight is down from 207 lbs since last f/u with Dr. Jacinto Halim, today's weight is 202 lbs with a dry weight of 195 lbs  Instructed patient to contact Dr. Jacinto Halim to report persistent increased weight and edema for instructions on diuretic use, patient verbalizes understanding and is agreeable  Reiterated importance of adhering to low Sodium diet        Our next appointment is by telephone on 11/28/22 at 12:30 PM  Please call the care guide team at [phone] if you need to cancel or reschedule your appointment.   If you are experiencing a Mental Health or Behavioral Health Crisis or need someone to talk to, please call 1-800-273-TALK (toll free, 24 hour hotline)  Patient verbalizes understanding of instructions and care plan provided today and agrees to view in MyChart. Active MyChart status and patient understanding of how to access instructions and care plan via MyChart confirmed with patient.     Delsa Sale, RN, BSN, CCM Care Management Coordinator [facility] Care Management Direct Phone: [phone]

## 2022-11-22 ENCOUNTER — Other Ambulatory Visit (HOSPITAL_COMMUNITY): Payer: Medicare Other

## 2022-11-22 ENCOUNTER — Ambulatory Visit: Payer: Medicare Other | Admitting: Vascular Surgery

## 2022-11-23 DIAGNOSIS — I110 Hypertensive heart disease with heart failure: Secondary | ICD-10-CM | POA: Diagnosis not present

## 2022-11-23 DIAGNOSIS — N1832 Chronic kidney disease, stage 3b: Secondary | ICD-10-CM | POA: Diagnosis not present

## 2022-11-23 DIAGNOSIS — I872 Venous insufficiency (chronic) (peripheral): Secondary | ICD-10-CM | POA: Diagnosis not present

## 2022-11-23 DIAGNOSIS — I5043 Acute on chronic combined systolic (congestive) and diastolic (congestive) heart failure: Secondary | ICD-10-CM | POA: Diagnosis not present

## 2022-11-23 DIAGNOSIS — R600 Localized edema: Secondary | ICD-10-CM | POA: Diagnosis not present

## 2022-11-23 NOTE — Telephone Encounter (Signed)
No action done

## 2022-11-24 ENCOUNTER — Ambulatory Visit
Admission: RE | Admit: 2022-11-24 | Discharge: 2022-11-24 | Disposition: A | Discharge status: Home / Self Care | Payer: Medicare Other | Source: Ambulatory Visit | Attending: Interventional Radiology | Admitting: Interventional Radiology

## 2022-11-24 DIAGNOSIS — H353123 Nonexudative age-related macular degeneration, left eye, advanced atrophic without subfoveal involvement: Secondary | ICD-10-CM | POA: Diagnosis not present

## 2022-11-24 DIAGNOSIS — I442 Atrioventricular block, complete: Secondary | ICD-10-CM

## 2022-11-24 DIAGNOSIS — I9789 Other postprocedural complications and disorders of the circulatory system, not elsewhere classified: Secondary | ICD-10-CM | POA: Diagnosis not present

## 2022-11-24 DIAGNOSIS — H35033 Hypertensive retinopathy, bilateral: Secondary | ICD-10-CM | POA: Diagnosis not present

## 2022-11-24 DIAGNOSIS — H353114 Nonexudative age-related macular degeneration, right eye, advanced atrophic with subfoveal involvement: Secondary | ICD-10-CM | POA: Diagnosis not present

## 2022-11-24 DIAGNOSIS — H43813 Vitreous degeneration, bilateral: Secondary | ICD-10-CM | POA: Diagnosis not present

## 2022-11-24 DIAGNOSIS — H353221 Exudative age-related macular degeneration, left eye, with active choroidal neovascularization: Secondary | ICD-10-CM | POA: Diagnosis not present

## 2022-11-24 DIAGNOSIS — H43393 Other vitreous opacities, bilateral: Secondary | ICD-10-CM | POA: Diagnosis not present

## 2022-11-24 HISTORY — PX: IR RADIOLOGIST EVAL & MGMT: IMG5224

## 2022-11-24 NOTE — Progress Notes (Signed)
Chief Complaint: Patient was consulted remotely today (TeleHealth) for AAA post EVAR with type 2 endoleak at the request of , K.    Referring Physician(s): Clark,Christopher J   History of Present Illness: Lisa Mcclure is a 89 y.o. female with multiple comorbidities including obesity, chronic diastolic heart failure, hypertensive heart disease, chronic 3A kidney disease, paroxysmal atrial fibrillation, obstructive sleep apnea and abdominal aortic aneurysm.  She underwent endovascular aortic repair for a pending abdominal aortic rupture on 12/20/2019 by Dr. Sherald Hess.  Subsequent follow-up imaging has demonstrated a type II endoleak which has persisted over time and she has enlargement of the excluded aneurysm sac.  At the time of presentation in September 2021, the aneurysm sac measured up to 5.5 cm.  The aneurysm then remained relatively stable to slightly improved in size on follow-up imaging from January 2022.  However, just over 2 years later her aneurysm now measures up to 6.1 cm by my measurements.   She underwent a successful direct puncture liquid embolization of the endoleak performed under moderate sedation on 10/24/2022.  She tolerated this procedure very well and was successfully discharged home the same day.  We spoke over the telephone today for her 1 month follow-up evaluation.  She is doing exceptionally well.  She reports that her puncture site is completely healed and she cannot even see where it is.  She has had no pain, nausea or other clinical symptoms.  She is very pleased and extremely impressed with how easy the entire process was for her.  Past Medical History:  Diagnosis Date   Acute renal failure superimposed on stage 3b chronic kidney disease (HCC) 06/15/2021   Anemia    h/o hemorrhoidal bleeding and blood transfusion   Chronic diastolic CHF (congestive heart failure) (HCC)    Depression    Diverticulosis    DOE (dyspnea on exertion)     Esophageal reflux    GERD (gastroesophageal reflux disease)    Hypothyroidism    Insomnia    LBP (low back pain)    OAB (overactive bladder)    Obesity    OSA (obstructive sleep apnea)    Osteoarthritis    Paroxysmal atrial fibrillation (HCC)    Rheumatoid arthritis(714.0)    Shoulder pain, bilateral    Unspecified essential hypertension    Venous insufficiency     Past Surgical History:  Procedure Laterality Date   ABDOMINAL AORTIC ENDOVASCULAR STENT GRAFT N/A 12/20/2019   Procedure: Aortogram including catheter selection of aorta and bilateral iliac arteriogram, Endovascular repair of infrarenal abdominal aortic aneurysm with bifurcated stent graft (26 mm x 14 x 12 main body, right bell bottom with a 20 mm x 10 cm piece, and left bell bottom with a 16 mm x 12 cm piece) ;  Surgeon: Cephus Shelling, MD;  Location: [facility] OR;  Service: Vascular;  Laterality: N/A;   APPENDECTOMY  1953   BIOPSY  06/18/2021   Procedure: BIOPSY;  Surgeon: Kerin Salen, MD;  Location: [facility] ENDOSCOPY;  Service: Gastroenterology;;   bladder abduction-1996  1996   breast biopsy Right 1980   CARDIOVERSION N/A 12/24/2019   Procedure: CARDIOVERSION;  Surgeon: Yates Decamp, MD;  Location: [facility] ENDOSCOPY;  Service: Cardiovascular;  Laterality: N/A;   CARDIOVERSION N/A 04/25/2022   Procedure: CARDIOVERSION;  Surgeon: Yates Decamp, MD;  Location: [facility] ENDOSCOPY;  Service: Cardiovascular;  Laterality: N/A;   CESAREAN SECTION     1957, 1961, 1964   COSMETIC SURGERY  1996   CYSTOCELE REPAIR  ESOPHAGOGASTRODUODENOSCOPY (EGD) WITH PROPOFOL N/A 06/18/2021   Procedure: ESOPHAGOGASTRODUODENOSCOPY (EGD) WITH PROPOFOL;  Surgeon: Kerin Salen, MD;  Location: [facility] ENDOSCOPY;  Service: Gastroenterology;  Laterality: N/A;   FLEXIBLE SIGMOIDOSCOPY N/A 04/10/2015   Procedure: FLEXIBLE SIGMOIDOSCOPY;  Surgeon: Willis Modena, MD;  Location: [facility] ENDOSCOPY;  Service: Endoscopy;  Laterality: N/A;   IR AORTAGRAM ABDOMINAL SERIALOGRAM  10/26/2022    IR EMBO ARTERIAL NOT HEMORR HEMANG INC GUIDE ROADMAPPING  10/24/2022   IR RADIOLOGIST EVAL & MGMT  09/13/2022   IR RADIOLOGIST EVAL & MGMT  11/24/2022   IR US GUIDE VASC ACCESS LEFT  10/26/2022   IR US GUIDE VASC ACCESS LEFT  10/26/2022   IR US GUIDE VASC ACCESS LEFT  10/26/2022   IR US GUIDE VASC ACCESS LEFT  10/26/2022   knee arthroscopy Right 1996, 2010   KNEE ARTHROSCOPY W/ AUTOGENOUS CARTILAGE IMPLANTATION (ACI) PROCEDURE Left 1994, 1995   REFRACTIVE SURGERY  01/2020   SHOULDER SURGERY  1990   TEE WITHOUT CARDIOVERSION N/A 12/24/2019   Procedure: TRANSESOPHAGEAL ECHOCARDIOGRAM (TEE);  Surgeon: Yates Decamp, MD;  Location: [facility] ENDOSCOPY;  Service: Cardiovascular;  Laterality: N/A;   TEE WITHOUT CARDIOVERSION N/A 06/23/2022   Procedure: TRANSESOPHAGEAL ECHOCARDIOGRAM (TEE);  Surgeon: Clotilde Dieter, DO;  Location: [facility] ENDOSCOPY;  Service: Cardiovascular;  Laterality: N/A;   TOTAL ABDOMINAL HYSTERECTOMY  1972   ULTRASOUND GUIDANCE FOR VASCULAR ACCESS Bilateral 12/20/2019   Procedure: Ultrasound-guided access of bilateral common femoral arteries for delivery of endograft and percutaneous closure;  Surgeon: Cephus Shelling, MD;  Location: [facility] OR;  Service: Vascular;  Laterality: Bilateral;   VESICOVAGINAL FISTULA CLOSURE W/ TAH     WRIST SURGERY  1967    Allergies: Statins, Atorvastatin, Gabapentin, and Methocarbamol  Medications: Prior to Admission medications   Medication Sig Start Date End Date Taking? Authorizing Provider  ALEVE ARTHRITIS PAIN 1 % GEL Apply 2 g topically 4 (four) times daily as needed (for pain).    [provider]  amiodarone (PACERONE) 100 MG tablet TAKE ONE TABLET BY MOUTH EVERY OTHER DAY AS DIRECTED 10/03/22   Yates Decamp, MD  apixaban (ELIQUIS) 2.5 MG TABS tablet Take 1 tablet (2.5 mg total) by mouth 2 (two) times daily. Patient taking differently: Take 2.5 mg by mouth in the morning and at bedtime. 02/25/22   Yates Decamp, MD  Biotin-Vitamin C (HAIR SKIN  NAILS GUMMIES) 1250-50 MCG-MG CHEW 2 gummies Orally once a day 10/06/22   [provider]  budesonide (PULMICORT) 0.5 MG/2ML nebulizer solution Take 2 mLs (0.5 mg total) by nebulization in the morning and at bedtime. Patient taking differently: Take 0.5 mg by nebulization daily. 03/22/22   Glenford Bayley, NP  busPIRone (BUSPAR) 7.5 MG tablet Take 7.5 mg by mouth See admin instructions. Take 7.5 mg by mouth at bedtime and an additional 7.5 mg once a day as needed for anxiety    [provider]  carvedilol (COREG) 3.125 MG tablet Take 1 tablet (3.125 mg total) by mouth 2 (two) times daily with a meal. 11/03/22   Yates Decamp, MD  dapagliflozin propanediol (FARXIGA) 10 MG TABS tablet Take 1 tablet (10 mg total) by mouth daily before breakfast. 01/27/22   Yates Decamp, MD  diclofenac sodium (VOLTAREN) 1 % GEL Apply 2.25 g topically 3 (three) times daily as needed (for pain). 10/23/18   [provider]  furosemide (LASIX) 20 MG tablet TAKE TWO TABLETS BY MOUTH ONCE DAILY AS NEEDED fluid 10/03/22   Yates Decamp, MD  HYDROcodone-acetaminophen [facility]) 7.5-325  MG tablet Take 1 tablet by mouth every 6 (six) hours as needed for moderate pain. 04/13/15   Vassie Loll, MD  ipratropium (ATROVENT) 0.03 % nasal spray Place 2 sprays into both nostrils every 12 (twelve) hours. Patient taking differently: Place 2 sprays into both nostrils every 12 (twelve) hours as needed for rhinitis. 03/22/22   Glenford Bayley, NP  latanoprost (XALATAN) 0.005 % ophthalmic solution Place 1 drop into both eyes at bedtime.    [provider]  levothyroxine (SYNTHROID) 125 MCG tablet Take 125 mcg by mouth daily before breakfast.     [provider]  melatonin 3 MG TABS tablet Take 3 mg by mouth at bedtime.    [provider]  methocarbamol (ROBAXIN) 500 MG tablet Take 500 mg by mouth every 6 (six) hours as needed. Patient not taking: Reported on 11/03/2022 08/31/22   [provider]  Multiple Vitamins-Minerals (PRESERVISION AREDS 2 PO) Take by mouth.    [provider]  nortriptyline (PAMELOR) 10 MG capsule Take 10 mg by mouth daily. 08/31/22   [provider]  rOPINIRole (REQUIP) 1 MG tablet Take 1 mg by mouth See admin instructions. Take 1 mg by mouth at 1 PM and 2 mg at 9 PM 06/19/15   [provider]  sacubitril-valsartan (ENTRESTO) 24-26 MG Take 1 tablet by mouth 2 (two) times daily. 06/25/22   Zannie Cove, MD  SYSTANE COMPLETE PF 0.6 % SOLN Place 1 drop into both eyes 4 (four) times daily as needed (for dryness).    [provider]     Family History  Problem Relation Age of Onset   Lung cancer Mother    COPD Father    Breast cancer Maternal Aunt    Heart attack Maternal Grandmother 73   Lung cancer Son     Social History   Socioeconomic History   Marital status: Widowed    Spouse name: Not on file   Number of children: 3   Years of education: College   Highest education level: Bachelor's degree (e.g., BA, AB, BS)  Occupational History   Occupation: Retired  Tobacco Use   Smoking status: Former    Current packs/day: 0.00    Average packs/day: 0.2 packs/day for 10.0 years (2.0 ttl pk-yrs)    Types: Cigarettes    Start date: 04/11/1974    Quit date: 04/11/1984    Years since quitting: 38.6    Passive exposure: Past   Smokeless tobacco: Never  Vaping Use   Vaping status: Never Used  Substance and Sexual Activity   Alcohol use: Yes    Alcohol/week: 0.0 standard drinks of alcohol    Comment: cocktail once a week   Drug use: No   Sexual activity: Not Currently  Other Topics Concern   Not on file  Social History Narrative   Widowed   Lives alone   3 children, 2 living   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, Production designer, theatre/television/film of physician's office   Went to [place] May 2016 for 11 days   Drinks 1 cup of coffee in the morning   Social Determinants of Health   Financial Resource Strain: Low Risk  (08/11/2021)    Overall Financial Resource Strain (CARDIA)    Difficulty of Paying Living Expenses: Not hard at all  Food Insecurity: No Food Insecurity (08/23/2022)   Hunger Vital Sign    Worried About Running Out of Food in the Last Year: Never true    Ran Out of Food in  the Last Year: Never true  Transportation Needs: No Transportation Needs (08/23/2022)   PRAPARE - Administrator, Civil Service (Medical): No    Lack of Transportation (Non-Medical): No  Physical Activity: Inactive (08/11/2021)   Exercise Vital Sign    Days of Exercise per Week: 0 days    Minutes of Exercise per Session: 0 min  Stress: No Stress Concern Present (08/11/2021)   [employer] of Occupational Health - Occupational Stress Questionnaire    Feeling of Stress : Only a little  Social Connections: Moderately Isolated (08/11/2021)   Social Connection and Isolation Panel [NHANES]    Frequency of Communication with Friends and Family: More than three times a week    Frequency of Social Gatherings with Friends and Family: More than three times a week    Attends Religious Services: More than 4 times per year    Active Member of [employer] or Organizations: No    Attends Banker Meetings: Never    Marital Status: Widowed    Review of Systems  Review of Systems: A 12 point ROS discussed and pertinent positives are indicated in the HPI above.  All other systems are negative.  Advance Care Plan: The advanced care plan/surrogate decision maker was discussed at the time of visit and the patient did not wish to discuss or was not able to name a surrogate decision maker or provide an advance care plan.    Physical Exam No direct physical exam was performed (except for noted visual exam findings with Video Visits).    Vital Signs: There were no vitals taken for this visit.  Imaging: IR Radiologist Eval & Mgmt  Result Date: 11/24/2022 EXAM: ESTABLISHED PATIENT OFFICE VISIT CHIEF COMPLAINT: SEE EPIC NOTE HISTORY OF  PRESENT ILLNESS: SEE EPIC NOTE REVIEW OF SYSTEMS: SEE EPIC NOTE PHYSICAL EXAMINATION: SEE EPIC NOTE ASSESSMENT AND PLAN: SEE EPIC NOTE Electronically Signed   By: Malachy Moan M.D.   On: 11/24/2022 09:13    Labs:  CBC: Recent Labs    04/24/22 1027 06/19/22 1720 06/20/22 0254 10/24/22 0706  WBC 10.2 9.4 9.8 7.3  HGB 13.3 13.8 13.5 12.8  HCT 43.3 44.7 43.4 41.3  PLT 296 250 236 247    COAGS: Recent Labs    10/24/22 0706  INR 1.3*    BMP: Recent Labs    06/22/22 0032 06/23/22 0044 06/24/22 0040 06/28/22 1313 09/01/22 1120 10/24/22 0706  NA 136 135 134* 140  --  136  K 3.6 3.7 4.1 4.9  --  4.4  CL 94* 95* 93* 98  --  101  CO2 34* 31 30 28   --  28  GLUCOSE 112* 149* 123* 84  --  105*  BUN 21 24* 34* 36*  --  27*  CALCIUM 8.4* 8.6* 8.5* 8.9  --  8.6*  CREATININE 1.11* 1.06* 1.67* 1.08* 0.90 0.96  GFRNONAA 48* 51* 29*  --   --  57*    LIVER FUNCTION TESTS: Recent Labs    06/19/22 1720  BILITOT 0.5  AST 40  ALT 35  ALKPHOS 89  PROT 7.6  ALBUMIN 3.5    TUMOR MARKERS: No results for input(s): "AFPTM", "CEA", "CA199", "CHROMGRNA" in the last 8760 hours.  Assessment and Plan:  Extraordinarily pleasant 89 year old female doing very well 4 weeks status post direct puncture liquid embolization of type II endoleak.  She has completely recovered and has had no complications or adverse events.  Her puncture site is completely healed.  She says that she is very impressed with how easy the entire process was.  1.) CTA abd/pelvis in January 2025 with clinic visit to follow.      Electronically Signed: Sterling Big 11/24/2022, 11:10 AM   I spent a total of  10 Minutes in remote  clinical consultation, greater than 50% of which was counseling/coordinating care for AAA with type 2 endoleak.    Visit type: Audio only (telephone). Audio (no video) only due to patient preference. Alternative for in-person consultation at [facility], [address]  [place], [place], [place]. This visit type was conducted due to national recommendations for restrictions regarding the COVID-19 Pandemic (e.g. social distancing).  This format is felt to be most appropriate for this patient at this time.  All issues noted in this document were discussed and addressed.

## 2022-11-28 ENCOUNTER — Ambulatory Visit: Payer: Self-pay

## 2022-11-28 NOTE — Patient Outreach (Signed)
  Care Coordination   Follow Up Visit Note   11/28/2022 Name: Lisa Mcclure MRN: [id number] DOB: Sep 03, 1932  Lisa Mcclure is a 89 y.o. year old female who sees Garlan Fillers, MD for primary care. I spoke with  Lisa Mcclure by phone today.  What matters to the patients health and wellness today?  Patient is concerned about having symptoms suggestive of CHF.     Goals Addressed               This Visit's Progress     Patient Stated     I have started a new medication for my heart failure (pt-stated)        Care Coordination Interventions: Reviewed Heart Failure Action Plan in depth and provided written copy Reviewed role of diuretics in prevention of fluid overload and management of heart failure Determined patient is experiencing symptoms suggestive of CHF despite following MD recommendations with use of her diuretics, she reports cough, increased edema to lower extremities, weight gain neither of these are trending down with use of diuretics, dyspnea Instructed patient to contact Dr. Verl Dicker office immediately following this call to report symptoms, patient verbalizes understanding and is agreeable, she has a caregiver present with her Scheduled nurse care coordination follow up in 2 days     Interventions Today    Flowsheet Row Most Recent Value  Chronic Disease   Chronic disease during today's visit Congestive Heart Failure (CHF)  General Interventions   General Interventions Discussed/Reviewed General Interventions Discussed, General Interventions Reviewed, Doctor Visits  Doctor Visits Discussed/Reviewed Doctor Visits Discussed, Doctor Visits Reviewed, PCP, Specialist  Education Interventions   Education Provided Provided Education  Provided Verbal Education On When to see the doctor, Medication  Pharmacy Interventions   Pharmacy Dicussed/Reviewed Pharmacy Topics Discussed, Pharmacy Topics Reviewed, Medications and their functions          SDOH  assessments and interventions completed:  No     Care Coordination Interventions:  Yes, provided   Follow up plan: Follow up call scheduled for 11/30/22 @10 :00 AM    Encounter Outcome:  Pt. Visit Completed

## 2022-11-28 NOTE — Patient Instructions (Signed)
Visit Information  Thank you for taking time to visit with me today. Please don't hesitate to contact me if I can be of assistance to you.   Following are the goals we discussed today:   Goals Addressed               This Visit's Progress     Patient Stated     I have started a new medication for my heart failure (pt-stated)        Care Coordination Interventions: Reviewed Heart Failure Action Plan in depth and provided written copy Reviewed role of diuretics in prevention of fluid overload and management of heart failure Determined patient is experiencing symptoms suggestive of CHF despite following MD recommendations with use of her diuretics, she reports cough, increased edema to lower extremities, weight gain neither of these are trending down with use of diuretics, dyspnea Instructed patient to contact Dr. Verl Dicker office immediately following this call to report symptoms, patient verbalizes understanding and is agreeable, she has a caregiver present with her Scheduled nurse care coordination follow up in 2 days         Our next appointment is by telephone on 11/30/22 at 10:30 AM  Please call the care guide team at [phone] if you need to cancel or reschedule your appointment.   If you are experiencing a Mental Health or Behavioral Health Crisis or need someone to talk to, please call 1-800-273-TALK (toll free, 24 hour hotline)  Patient verbalizes understanding of instructions and care plan provided today and agrees to view in MyChart. Active MyChart status and patient understanding of how to access instructions and care plan via MyChart confirmed with patient.     Delsa Sale, RN, BSN, CCM Care Management Coordinator [facility] Care Management  Direct Phone: [phone]

## 2022-11-29 ENCOUNTER — Other Ambulatory Visit: Payer: Self-pay

## 2022-11-29 ENCOUNTER — Inpatient Hospital Stay (HOSPITAL_BASED_OUTPATIENT_CLINIC_OR_DEPARTMENT_OTHER)
Admission: EM | Admit: 2022-11-29 | Discharge: 2022-12-02 | DRG: 291 | Disposition: A | Discharge status: Home Health | Payer: Medicare Other | Attending: Family Medicine | Admitting: Family Medicine

## 2022-11-29 ENCOUNTER — Emergency Department (HOSPITAL_BASED_OUTPATIENT_CLINIC_OR_DEPARTMENT_OTHER): Payer: Medicare Other

## 2022-11-29 ENCOUNTER — Encounter (HOSPITAL_BASED_OUTPATIENT_CLINIC_OR_DEPARTMENT_OTHER): Payer: Self-pay

## 2022-11-29 DIAGNOSIS — I5032 Chronic diastolic (congestive) heart failure: Secondary | ICD-10-CM

## 2022-11-29 DIAGNOSIS — Z79890 Hormone replacement therapy (postmenopausal): Secondary | ICD-10-CM | POA: Diagnosis not present

## 2022-11-29 DIAGNOSIS — E669 Obesity, unspecified: Secondary | ICD-10-CM | POA: Diagnosis present

## 2022-11-29 DIAGNOSIS — I5033 Acute on chronic diastolic (congestive) heart failure: Secondary | ICD-10-CM | POA: Diagnosis present

## 2022-11-29 DIAGNOSIS — I3139 Other pericardial effusion (noninflammatory): Secondary | ICD-10-CM | POA: Diagnosis present

## 2022-11-29 DIAGNOSIS — M069 Rheumatoid arthritis, unspecified: Secondary | ICD-10-CM | POA: Diagnosis present

## 2022-11-29 DIAGNOSIS — I872 Venous insufficiency (chronic) (peripheral): Secondary | ICD-10-CM | POA: Diagnosis present

## 2022-11-29 DIAGNOSIS — N3281 Overactive bladder: Secondary | ICD-10-CM | POA: Diagnosis not present

## 2022-11-29 DIAGNOSIS — I513 Intracardiac thrombosis, not elsewhere classified: Secondary | ICD-10-CM | POA: Diagnosis present

## 2022-11-29 DIAGNOSIS — G4733 Obstructive sleep apnea (adult) (pediatric): Secondary | ICD-10-CM | POA: Diagnosis present

## 2022-11-29 DIAGNOSIS — I480 Paroxysmal atrial fibrillation: Secondary | ICD-10-CM | POA: Diagnosis not present

## 2022-11-29 DIAGNOSIS — I130 Hypertensive heart and chronic kidney disease with heart failure and stage 1 through stage 4 chronic kidney disease, or unspecified chronic kidney disease: Secondary | ICD-10-CM | POA: Diagnosis not present

## 2022-11-29 DIAGNOSIS — J449 Chronic obstructive pulmonary disease, unspecified: Secondary | ICD-10-CM | POA: Diagnosis present

## 2022-11-29 DIAGNOSIS — I2510 Atherosclerotic heart disease of native coronary artery without angina pectoris: Secondary | ICD-10-CM | POA: Diagnosis present

## 2022-11-29 DIAGNOSIS — K5790 Diverticulosis of intestine, part unspecified, without perforation or abscess without bleeding: Secondary | ICD-10-CM | POA: Diagnosis not present

## 2022-11-29 DIAGNOSIS — Z79899 Other long term (current) drug therapy: Secondary | ICD-10-CM | POA: Diagnosis not present

## 2022-11-29 DIAGNOSIS — Z8249 Family history of ischemic heart disease and other diseases of the circulatory system: Secondary | ICD-10-CM

## 2022-11-29 DIAGNOSIS — G2581 Restless legs syndrome: Secondary | ICD-10-CM | POA: Diagnosis present

## 2022-11-29 DIAGNOSIS — Z7951 Long term (current) use of inhaled steroids: Secondary | ICD-10-CM

## 2022-11-29 DIAGNOSIS — Z87891 Personal history of nicotine dependence: Secondary | ICD-10-CM | POA: Diagnosis not present

## 2022-11-29 DIAGNOSIS — F419 Anxiety disorder, unspecified: Secondary | ICD-10-CM | POA: Diagnosis present

## 2022-11-29 DIAGNOSIS — R918 Other nonspecific abnormal finding of lung field: Secondary | ICD-10-CM | POA: Diagnosis not present

## 2022-11-29 DIAGNOSIS — Z801 Family history of malignant neoplasm of trachea, bronchus and lung: Secondary | ICD-10-CM

## 2022-11-29 DIAGNOSIS — J9601 Acute respiratory failure with hypoxia: Secondary | ICD-10-CM | POA: Insufficient documentation

## 2022-11-29 DIAGNOSIS — Z825 Family history of asthma and other chronic lower respiratory diseases: Secondary | ICD-10-CM

## 2022-11-29 DIAGNOSIS — E039 Hypothyroidism, unspecified: Secondary | ICD-10-CM | POA: Diagnosis present

## 2022-11-29 DIAGNOSIS — Z1152 Encounter for screening for COVID-19: Secondary | ICD-10-CM | POA: Diagnosis not present

## 2022-11-29 DIAGNOSIS — N1832 Chronic kidney disease, stage 3b: Secondary | ICD-10-CM | POA: Diagnosis present

## 2022-11-29 DIAGNOSIS — Z66 Do not resuscitate: Secondary | ICD-10-CM | POA: Diagnosis present

## 2022-11-29 DIAGNOSIS — Z7901 Long term (current) use of anticoagulants: Secondary | ICD-10-CM

## 2022-11-29 DIAGNOSIS — I350 Nonrheumatic aortic (valve) stenosis: Secondary | ICD-10-CM

## 2022-11-29 DIAGNOSIS — I509 Heart failure, unspecified: Secondary | ICD-10-CM | POA: Diagnosis not present

## 2022-11-29 DIAGNOSIS — I10 Essential (primary) hypertension: Secondary | ICD-10-CM | POA: Diagnosis present

## 2022-11-29 DIAGNOSIS — R0602 Shortness of breath: Secondary | ICD-10-CM | POA: Diagnosis not present

## 2022-11-29 DIAGNOSIS — K219 Gastro-esophageal reflux disease without esophagitis: Secondary | ICD-10-CM | POA: Diagnosis present

## 2022-11-29 DIAGNOSIS — Z48812 Encounter for surgical aftercare following surgery on the circulatory system: Secondary | ICD-10-CM | POA: Diagnosis not present

## 2022-11-29 DIAGNOSIS — I517 Cardiomegaly: Secondary | ICD-10-CM | POA: Diagnosis not present

## 2022-11-29 DIAGNOSIS — I110 Hypertensive heart disease with heart failure: Secondary | ICD-10-CM | POA: Diagnosis not present

## 2022-11-29 DIAGNOSIS — Z803 Family history of malignant neoplasm of breast: Secondary | ICD-10-CM

## 2022-11-29 DIAGNOSIS — S81802D Unspecified open wound, left lower leg, subsequent encounter: Secondary | ICD-10-CM | POA: Diagnosis not present

## 2022-11-29 DIAGNOSIS — R32 Unspecified urinary incontinence: Secondary | ICD-10-CM | POA: Diagnosis not present

## 2022-11-29 DIAGNOSIS — F32A Depression, unspecified: Secondary | ICD-10-CM | POA: Diagnosis present

## 2022-11-29 LAB — TROPONIN I (HIGH SENSITIVITY)
Troponin I (High Sensitivity): 10 ng/L (ref <18)
Troponin I (High Sensitivity): 11 ng/L (ref <18)

## 2022-11-29 LAB — CBC
HCT: 40.4 % (ref 36.0–46.0)
Hemoglobin: 13 g/dL (ref 12.0–15.0)
MCH: 30.9 pg (ref 26.0–34.0)
MCHC: 32.2 g/dL (ref 30.0–36.0)
MCV: 96 fL (ref 80.0–100.0)
Platelets: 255 10*3/uL (ref 150–400)
RBC: 4.21 MIL/uL (ref 3.87–5.11)
RDW: 14.1 % (ref 11.5–15.5)
WBC: 7.5 10*3/uL (ref 4.0–10.5)
nRBC: 0 % (ref 0.0–0.2)

## 2022-11-29 LAB — BASIC METABOLIC PANEL
Anion gap: 10 (ref 5–15)
BUN: 24 mg/dL — ABNORMAL HIGH (ref 8–23)
CO2: 28 mmol/L (ref 22–32)
Calcium: 8.7 mg/dL — ABNORMAL LOW (ref 8.9–10.3)
Chloride: 100 mmol/L (ref 98–111)
Creatinine, Ser: 0.85 mg/dL (ref 0.44–1.00)
GFR, Estimated: >60 mL/min (ref >60)
Glucose, Bld: 96 mg/dL (ref 70–99)
Potassium: 4.2 mmol/L (ref 3.5–5.1)
Sodium: 138 mmol/L (ref 135–145)

## 2022-11-29 LAB — HEPATIC FUNCTION PANEL
ALT: 9 U/L (ref 0–44)
AST: 18 U/L (ref 15–41)
Albumin: 4 g/dL (ref 3.5–5.0)
Alkaline Phosphatase: 86 U/L (ref 38–126)
Bilirubin, Direct: 0.1 mg/dL (ref 0.0–0.2)
Indirect Bilirubin: 0.3 mg/dL (ref 0.3–0.9)
Total Bilirubin: 0.4 mg/dL (ref 0.3–1.2)
Total Protein: 7.3 g/dL (ref 6.5–8.1)

## 2022-11-29 LAB — RESP PANEL BY RT-PCR (RSV, FLU A&B, COVID)  RVPGX2
Influenza A by PCR: NEGATIVE
Influenza B by PCR: NEGATIVE
Resp Syncytial Virus by PCR: NEGATIVE
SARS Coronavirus 2 by RT PCR: NEGATIVE

## 2022-11-29 LAB — BRAIN NATRIURETIC PEPTIDE: B Natriuretic Peptide: 307.9 pg/mL — ABNORMAL HIGH (ref 0.0–100.0)

## 2022-11-29 MED ORDER — FUROSEMIDE 10 MG/ML IJ SOLN
40.0000 mg | Freq: Once | INTRAMUSCULAR | Status: AC
  Administered 2022-11-29: 40 mg via INTRAVENOUS
  Filled 2022-11-29: qty 4

## 2022-11-29 NOTE — ED Notes (Signed)
Patient ambulated down hallway. Lowest SpO2 85%. Upon return to room, patient with mildly increased WOB. RN and EDP made aware.

## 2022-11-29 NOTE — Progress Notes (Addendum)
Plan of Care Note for accepted transfer  Patient: Lisa Mcclure              XBJ:[id number]  DOA: 11/29/2022     Facility requesting transfer: [facility] emergency department Requesting Provider: Marlene Bast, MD  Reason for transfer: CHF exacerbation  Facility course:  89 year old female history of obesity, chronic diastolic CHF, hypertensive heart disease and chronic 3A-B kidney disease, paroxysmal atrial fibrillation,  anemia secondary to hemorrhoids and diverticulosis, esophageal reflux, depression, OSA intolerant to CPAP on nocturnal 02, rheumatoid arthritis, and venous insufficiency, aortic and coronary calcification, s/p AAA repair on 12/20/2019 sorry I accidentally cannot recall presenting for leg swelling, shortness of breath.  Patient states for last 3 to 4 days she has had worsening shortness of breath especially with exertion.  She also feels like her legs are more swollen.  Has been taking her Lasix and is increased her dose and started spironolactone recently which has not been helping.  She has some DOE and orthopnea.  No chest pain, no syncope.  She lives alone at home.  No fevers or chills or cough.  She feels like both of her legs are red and swollen, she has been treated for cellulitis recently which made no difference.  She is not currently on antibiotics and does not feel like the redness is worsening.   At presentation to ED patient is hemodynamically stable.  BMP unremarkable. CBC unremarkable. Normal hepatic panel.  Elevated BNP 307 Troponin normal range.  Chest x-ray IMPRESSION: Cardiomegaly with central interstitial prominence, likely due to pulmonary edema.   Dr. Earlene Plater obtained bedside echocardiogram which showed a small pericardial effusion without any ventricular constraint.  Patient is hemodynamically stable.   Plan of care: Need to continue treatment and care for CHF exacerbation.  The patient is accepted for admission to medical- Telemetry unit, at  [facility].    Check [email or web address] for on-call coverage.  [facility] will assume care on arrival to accepting facility. Until arrival, medical decision making responsibilities remain with the EDP.  However, [facility] available 24/7 for questions and assistance.   Nursing staff please page [facility] Admits and Consults [phone]) as soon as the patient arrives to the hospital.    Author: Tereasa Coop, MD  11/29/2022  [facility]

## 2022-11-29 NOTE — ED Notes (Signed)
Pt. States SOB x 4 days.

## 2022-11-29 NOTE — ED Triage Notes (Signed)
Pt c/o shortness of breath 3-4 days, low heart rate and cellulitis in her legs. Pt reports that she has CHF.

## 2022-11-29 NOTE — ED Provider Notes (Signed)
[place] EMERGENCY DEPARTMENT AT [facility] Provider Note   CSN: [id number] Arrival date & time: 11/29/22  1722     History  Chief Complaint  Patient presents with   Shortness of Breath    Lisa Mcclure is a 89 y.o. female.   Shortness of Breath 89 year old female history of heart failure, hypothyroidism, atrial fibrillation presenting for leg swelling, shortness of breath.  Patient states for last 3 to 4 days she has had worsening shortness of breath especially with exertion.  She also feels like her legs are more swollen.  Has been taking her Lasix and is increased her dose and started spironolactone recently which has not been helping.  She has some DOE and orthopnea.  No chest pain, no syncope.  She lives alone at home.  No fevers or chills or cough.  She feels like both of her legs are red and swollen, she has been treated for cellulitis recently which made no difference.  She is not currently on antibiotics and does not feel like the redness is worsening.     Home Medications Prior to Admission medications   Medication Sig Start Date End Date Taking? Authorizing Provider  ALEVE ARTHRITIS PAIN 1 % GEL Apply 2 g topically 4 (four) times daily as needed (for pain).    [provider]  amiodarone (PACERONE) 100 MG tablet TAKE ONE TABLET BY MOUTH EVERY OTHER DAY AS DIRECTED 10/03/22   Yates Decamp, MD  apixaban (ELIQUIS) 2.5 MG TABS tablet Take 1 tablet (2.5 mg total) by mouth 2 (two) times daily. Patient taking differently: Take 2.5 mg by mouth in the morning and at bedtime. 02/25/22   Yates Decamp, MD  Biotin-Vitamin C (HAIR SKIN NAILS GUMMIES) 1250-50 MCG-MG CHEW 2 gummies Orally once a day 10/06/22   [provider]  budesonide (PULMICORT) 0.5 MG/2ML nebulizer solution Take 2 mLs (0.5 mg total) by nebulization in the morning and at bedtime. Patient taking differently: Take 0.5 mg by nebulization daily. 03/22/22   Glenford Bayley, NP  busPIRone  (BUSPAR) 7.5 MG tablet Take 7.5 mg by mouth See admin instructions. Take 7.5 mg by mouth at bedtime and an additional 7.5 mg once a day as needed for anxiety    [provider]  carvedilol (COREG) 3.125 MG tablet Take 1 tablet (3.125 mg total) by mouth 2 (two) times daily with a meal. 11/03/22   Yates Decamp, MD  dapagliflozin propanediol (FARXIGA) 10 MG TABS tablet Take 1 tablet (10 mg total) by mouth daily before breakfast. 01/27/22   Yates Decamp, MD  diclofenac sodium (VOLTAREN) 1 % GEL Apply 2.25 g topically 3 (three) times daily as needed (for pain). 10/23/18   [provider]  furosemide (LASIX) 20 MG tablet TAKE TWO TABLETS BY MOUTH ONCE DAILY AS NEEDED fluid 10/03/22   Yates Decamp, MD  HYDROcodone-acetaminophen (NORCO) 7.5-325 MG tablet Take 1 tablet by mouth every 6 (six) hours as needed for moderate pain. 04/13/15   Vassie Loll, MD  ipratropium (ATROVENT) 0.03 % nasal spray Place 2 sprays into both nostrils every 12 (twelve) hours. Patient taking differently: Place 2 sprays into both nostrils every 12 (twelve) hours as needed for rhinitis. 03/22/22   Glenford Bayley, NP  latanoprost (XALATAN) 0.005 % ophthalmic solution Place 1 drop into both eyes at bedtime.    [provider]  levothyroxine (SYNTHROID) 125 MCG tablet Take 125 mcg by mouth daily before breakfast.     [provider]  melatonin 3  MG TABS tablet Take 3 mg by mouth at bedtime.    [provider]  methocarbamol (ROBAXIN) 500 MG tablet Take 500 mg by mouth every 6 (six) hours as needed. Patient not taking: Reported on 11/03/2022 08/31/22   [provider]  Multiple Vitamins-Minerals (PRESERVISION AREDS 2 PO) Take by mouth.    [provider]  nortriptyline (PAMELOR) 10 MG capsule Take 10 mg by mouth daily. 08/31/22   [provider]  rOPINIRole (REQUIP) 1 MG tablet Take 1 mg by mouth See admin instructions. Take 1 mg by mouth at 1 PM and 2 mg at 9 PM 06/19/15    [provider]  sacubitril-valsartan (ENTRESTO) 24-26 MG Take 1 tablet by mouth 2 (two) times daily. 06/25/22   Zannie Cove, MD  SYSTANE COMPLETE PF 0.6 % SOLN Place 1 drop into both eyes 4 (four) times daily as needed (for dryness).    [provider]      Allergies    Statins, Atorvastatin, Gabapentin, and Methocarbamol    Review of Systems   Review of Systems  Respiratory:  Positive for shortness of breath.   Review of systems completed and notable as per HPI.  ROS otherwise negative.   Physical Exam Updated Vital Signs BP (!) 142/77 (BP Location: Right Arm)   Pulse 72   Temp 97.7 F (36.5 C) (Oral)   Resp 17   Ht 5\' 3"  (1.6 m)   Wt 93.4 kg   SpO2 99%   BMI 36.49 kg/m  Physical Exam Vitals and nursing note reviewed.  Constitutional:      General: She is not in acute distress.    Appearance: She is well-developed.  HENT:     Head: Normocephalic and atraumatic.  Eyes:     Extraocular Movements: Extraocular movements intact.     Conjunctiva/sclera: Conjunctivae normal.     Pupils: Pupils are equal, round, and reactive to light.  Cardiovascular:     Rate and Rhythm: Normal rate and regular rhythm.     Heart sounds: No murmur heard. Pulmonary:     Effort: Pulmonary effort is normal. No respiratory distress.     Breath sounds: Examination of the right-lower field reveals rales. Examination of the left-lower field reveals rales. Rales present.  Abdominal:     Palpations: Abdomen is soft.     Tenderness: There is no abdominal tenderness.  Musculoskeletal:        General: No swelling.     Cervical back: Neck supple.     Right lower leg: Edema present.     Left lower leg: Edema present.     Comments: Slight erythema and warmth over the bilateral calves.  No induration or fluctuance.  3+ edema.  Skin:    General: Skin is warm and dry.     Capillary Refill: Capillary refill takes less than 2 seconds.  Neurological:     Mental Status: She is alert.   Psychiatric:        Mood and Affect: Mood normal.     ED Results / Procedures / Treatments   Labs (all labs ordered are listed, but only abnormal results are displayed) Labs Reviewed  BASIC METABOLIC PANEL - Abnormal; Notable for the following components:      Result Value   BUN 24 (*)    Calcium 8.7 (*)    All other components within normal limits  BRAIN NATRIURETIC PEPTIDE - Abnormal; Notable for the following components:   B Natriuretic Peptide 307.9 (*)  All other components within normal limits  RESP PANEL BY RT-PCR (RSV, FLU A&B, COVID)  [id number]  CBC  HEPATIC FUNCTION PANEL  TROPONIN I (HIGH SENSITIVITY)  TROPONIN I (HIGH SENSITIVITY)    EKG EKG Interpretation Date/Time:  Tuesday November 29 2022 17:34:59 EDT Ventricular Rate:  75 PR Interval:  210 QRS Duration:  80 QT Interval:  394 QTC Calculation: 439 R Axis:   12  Text Interpretation: Sinus rhythm with 1st degree A-V block with occasional Premature ventricular complexes Anterior infarct , age undetermined Abnormal ECG When compared with ECG of 19-Jun-2022 17:12, PREVIOUS ECG IS PRESENT Confirmed by Fulton Reek [phone]) on 11/29/2022 5:45:46 PM  Radiology DG Chest Port 1 View  Result Date: 11/29/2022 CLINICAL DATA:  Shortness of breath EXAM: PORTABLE CHEST 1 VIEW COMPARISON:  Chest x-ray 06/19/2022 CT of the chest 06/18/2021 FINDINGS:.: FINDINGS:. The heart is enlarged. There central interstitial prominence. There is no lung consolidation, pleural effusion or pneumothorax. Degenerative changes affect both shoulders. IMPRESSION: Cardiomegaly with central interstitial prominence, likely due to pulmonary edema. Electronically Signed   By: Darliss Cheney M.D.   On: 11/29/2022 21:15    Procedures Procedures    Medications Ordered in ED Medications  furosemide (LASIX) injection 40 mg (40 mg Intravenous Given 11/29/22 2229)    ED Course/ Medical Decision Making/ A&P                                 Medical  Decision Making Amount and/or Complexity of Data Reviewed Labs: ordered. Radiology: ordered.  Risk Prescription drug management. Decision regarding hospitalization.   Medical Decision Making:   MICHELINE MCCRUMB is a 89 y.o. female who presented to the ED today with shortness of breath, leg swelling.  No signs reviewed.  On exam she is well-appearing, normal work of breathing but mildly tachypneic intermittently with bibasilar rales.  She appears volume up on exam and has history of heart failure despite taking all of her medications.  She has mild lower extremity edema which is symmetric, she does have some erythema and warmth there although this is chronic and is already not respond to antibiotics.  I suspect it is more stasis dermatitis rather than acute infection.  Especially given her lack of fever, leukocytosis or progressive symptoms.  I ambulated her in the ED, she desatted into the low 80s with ambulation on room air.  She is not hypoxic at rest however.  Her BNP is elevated although improved from prior, troponin is negative low concern for ACS.  Her chest x-ray shows marked cardiomegaly, pulmonary edema.  I performed a bedside echo which shows possible small effusion which will be new for her.  I do not see any signs of tamponade.  However given this and her heart failure will plan to give IV Lasix and admit for echo and further management.   Patient placed on continuous vitals and telemetry monitoring while in ED which was reviewed periodically.  Reviewed and confirmed nursing documentation for past medical history, family history, social history.   Patient's presentation is most consistent with acute presentation with potential threat to life or bodily function.           Final Clinical Impression(s) / ED Diagnoses Final diagnoses:  Acute on chronic congestive heart failure, unspecified heart failure type (HCC)    Rx / DC Orders ED Discharge Orders     None  Laurence Spates, MD 11/30/22 0000

## 2022-11-30 ENCOUNTER — Other Ambulatory Visit (HOSPITAL_COMMUNITY): Payer: Medicare Other

## 2022-11-30 ENCOUNTER — Ambulatory Visit: Payer: Self-pay

## 2022-11-30 DIAGNOSIS — K219 Gastro-esophageal reflux disease without esophagitis: Secondary | ICD-10-CM | POA: Diagnosis present

## 2022-11-30 DIAGNOSIS — I509 Heart failure, unspecified: Secondary | ICD-10-CM | POA: Diagnosis present

## 2022-11-30 DIAGNOSIS — F419 Anxiety disorder, unspecified: Secondary | ICD-10-CM | POA: Diagnosis present

## 2022-11-30 DIAGNOSIS — I5033 Acute on chronic diastolic (congestive) heart failure: Secondary | ICD-10-CM

## 2022-11-30 DIAGNOSIS — E039 Hypothyroidism, unspecified: Secondary | ICD-10-CM | POA: Diagnosis present

## 2022-11-30 DIAGNOSIS — F32A Depression, unspecified: Secondary | ICD-10-CM | POA: Diagnosis present

## 2022-11-30 DIAGNOSIS — N1832 Chronic kidney disease, stage 3b: Secondary | ICD-10-CM | POA: Diagnosis present

## 2022-11-30 DIAGNOSIS — G2581 Restless legs syndrome: Secondary | ICD-10-CM | POA: Diagnosis present

## 2022-11-30 DIAGNOSIS — Z79890 Hormone replacement therapy (postmenopausal): Secondary | ICD-10-CM | POA: Diagnosis not present

## 2022-11-30 DIAGNOSIS — M069 Rheumatoid arthritis, unspecified: Secondary | ICD-10-CM | POA: Diagnosis present

## 2022-11-30 DIAGNOSIS — E669 Obesity, unspecified: Secondary | ICD-10-CM | POA: Diagnosis present

## 2022-11-30 DIAGNOSIS — Z79899 Other long term (current) drug therapy: Secondary | ICD-10-CM | POA: Diagnosis not present

## 2022-11-30 DIAGNOSIS — J9601 Acute respiratory failure with hypoxia: Secondary | ICD-10-CM | POA: Diagnosis present

## 2022-11-30 DIAGNOSIS — I130 Hypertensive heart and chronic kidney disease with heart failure and stage 1 through stage 4 chronic kidney disease, or unspecified chronic kidney disease: Secondary | ICD-10-CM | POA: Diagnosis present

## 2022-11-30 DIAGNOSIS — Z87891 Personal history of nicotine dependence: Secondary | ICD-10-CM | POA: Diagnosis not present

## 2022-11-30 DIAGNOSIS — J449 Chronic obstructive pulmonary disease, unspecified: Secondary | ICD-10-CM | POA: Diagnosis present

## 2022-11-30 DIAGNOSIS — I872 Venous insufficiency (chronic) (peripheral): Secondary | ICD-10-CM | POA: Diagnosis present

## 2022-11-30 DIAGNOSIS — I513 Intracardiac thrombosis, not elsewhere classified: Secondary | ICD-10-CM | POA: Diagnosis present

## 2022-11-30 DIAGNOSIS — I480 Paroxysmal atrial fibrillation: Secondary | ICD-10-CM | POA: Diagnosis present

## 2022-11-30 DIAGNOSIS — G4733 Obstructive sleep apnea (adult) (pediatric): Secondary | ICD-10-CM | POA: Diagnosis present

## 2022-11-30 DIAGNOSIS — Z1152 Encounter for screening for COVID-19: Secondary | ICD-10-CM | POA: Diagnosis not present

## 2022-11-30 DIAGNOSIS — Z66 Do not resuscitate: Secondary | ICD-10-CM | POA: Diagnosis present

## 2022-11-30 DIAGNOSIS — I3139 Other pericardial effusion (noninflammatory): Secondary | ICD-10-CM | POA: Diagnosis present

## 2022-11-30 DIAGNOSIS — I350 Nonrheumatic aortic (valve) stenosis: Secondary | ICD-10-CM | POA: Diagnosis present

## 2022-11-30 DIAGNOSIS — Z801 Family history of malignant neoplasm of trachea, bronchus and lung: Secondary | ICD-10-CM | POA: Diagnosis not present

## 2022-11-30 LAB — BASIC METABOLIC PANEL
Anion gap: 11 (ref 5–15)
BUN: 24 mg/dL — ABNORMAL HIGH (ref 8–23)
CO2: 25 mmol/L (ref 22–32)
Calcium: 8.3 mg/dL — ABNORMAL LOW (ref 8.9–10.3)
Chloride: 98 mmol/L (ref 98–111)
Creatinine, Ser: 0.95 mg/dL (ref 0.44–1.00)
GFR, Estimated: 58 mL/min — ABNORMAL LOW (ref >60)
Glucose, Bld: 88 mg/dL (ref 70–99)
Potassium: 3.6 mmol/L (ref 3.5–5.1)
Sodium: 134 mmol/L — ABNORMAL LOW (ref 135–145)

## 2022-11-30 MED ORDER — MELATONIN 3 MG PO TABS
3.0000 mg | ORAL_TABLET | Freq: Every day | ORAL | Status: DC
  Administered 2022-11-30 – 2022-12-01 (×2): 3 mg via ORAL
  Filled 2022-11-30 (×2): qty 1

## 2022-11-30 MED ORDER — POTASSIUM CHLORIDE CRYS ER 20 MEQ PO TBCR
20.0000 meq | EXTENDED_RELEASE_TABLET | Freq: Once | ORAL | Status: AC
  Administered 2022-11-30: 20 meq via ORAL
  Filled 2022-11-30: qty 1

## 2022-11-30 MED ORDER — ROPINIROLE HCL 0.5 MG PO TABS: 1.0000 mg | ORAL_TABLET | ORAL | Status: DC

## 2022-11-30 MED ORDER — LEVOTHYROXINE SODIUM 25 MCG PO TABS
125.0000 ug | ORAL_TABLET | Freq: Every day | ORAL | Status: DC
  Administered 2022-12-01 – 2022-12-02 (×2): 125 ug via ORAL
  Filled 2022-11-30 (×2): qty 1

## 2022-11-30 MED ORDER — APIXABAN 2.5 MG PO TABS
2.5000 mg | ORAL_TABLET | Freq: Two times a day (BID) | ORAL | Status: DC
  Administered 2022-11-30 – 2022-12-02 (×5): 2.5 mg via ORAL
  Filled 2022-11-30 (×5): qty 1

## 2022-11-30 MED ORDER — BUSPIRONE HCL 5 MG PO TABS
7.5000 mg | ORAL_TABLET | Freq: Every day | ORAL | Status: DC
  Administered 2022-11-30 – 2022-12-01 (×2): 7.5 mg via ORAL
  Filled 2022-11-30 (×3): qty 2

## 2022-11-30 MED ORDER — HYDROCODONE-ACETAMINOPHEN 7.5-325 MG PO TABS
1.0000 | ORAL_TABLET | Freq: Four times a day (QID) | ORAL | Status: DC | PRN
  Administered 2022-11-30: 1 via ORAL
  Filled 2022-11-30: qty 1

## 2022-11-30 MED ORDER — ROPINIROLE HCL 0.5 MG PO TABS
1.0000 mg | ORAL_TABLET | ORAL | Status: DC
  Administered 2022-12-01: 1 mg via ORAL
  Filled 2022-11-30: qty 2

## 2022-11-30 MED ORDER — SACUBITRIL-VALSARTAN 24-26 MG PO TABS
1.0000 | ORAL_TABLET | Freq: Two times a day (BID) | ORAL | Status: DC
  Administered 2022-11-30 – 2022-12-02 (×5): 1 via ORAL
  Filled 2022-11-30 (×5): qty 1

## 2022-11-30 MED ORDER — ROPINIROLE HCL 0.5 MG PO TABS
2.0000 mg | ORAL_TABLET | Freq: Every day | ORAL | Status: DC
  Administered 2022-11-30 – 2022-12-01 (×2): 2 mg via ORAL
  Filled 2022-11-30 (×3): qty 4

## 2022-11-30 MED ORDER — BUSPIRONE HCL 5 MG PO TABS: 7.5000 mg | ORAL_TABLET | Freq: Every day | ORAL | Status: DC | PRN

## 2022-11-30 MED ORDER — HYDROCODONE-ACETAMINOPHEN 5-325 MG PO TABS
1.0000 | ORAL_TABLET | Freq: Four times a day (QID) | ORAL | Status: DC | PRN
  Administered 2022-11-30 – 2022-12-02 (×4): 1 via ORAL
  Filled 2022-11-30 (×4): qty 1

## 2022-11-30 MED ORDER — ACETAMINOPHEN 650 MG RE SUPP: 650.0000 mg | Freq: Four times a day (QID) | RECTAL | Status: DC | PRN

## 2022-11-30 MED ORDER — FUROSEMIDE 10 MG/ML IJ SOLN
40.0000 mg | Freq: Two times a day (BID) | INTRAMUSCULAR | Status: DC
  Administered 2022-11-30 – 2022-12-02 (×5): 40 mg via INTRAVENOUS
  Filled 2022-11-30 (×5): qty 4

## 2022-11-30 MED ORDER — BUSPIRONE HCL 5 MG PO TABS: 7.5000 mg | ORAL_TABLET | Freq: Every day | ORAL | Status: DC

## 2022-11-30 MED ORDER — AMIODARONE HCL 200 MG PO TABS
100.0000 mg | ORAL_TABLET | Freq: Every day | ORAL | Status: DC
  Administered 2022-11-30 – 2022-12-02 (×3): 100 mg via ORAL
  Filled 2022-11-30 (×3): qty 1

## 2022-11-30 MED ORDER — CARVEDILOL 3.125 MG PO TABS
3.1250 mg | ORAL_TABLET | Freq: Two times a day (BID) | ORAL | Status: DC
  Administered 2022-11-30 – 2022-12-02 (×4): 3.125 mg via ORAL
  Filled 2022-11-30 (×4): qty 1

## 2022-11-30 MED ORDER — BUSPIRONE HCL 5 MG PO TABS: 7.5000 mg | ORAL_TABLET | ORAL | Status: DC

## 2022-11-30 MED ORDER — DAPAGLIFLOZIN PROPANEDIOL 10 MG PO TABS
10.0000 mg | ORAL_TABLET | Freq: Every day | ORAL | Status: DC
  Administered 2022-11-30 – 2022-12-02 (×3): 10 mg via ORAL
  Filled 2022-11-30 (×3): qty 1

## 2022-11-30 MED ORDER — ACETAMINOPHEN 325 MG PO TABS
650.0000 mg | ORAL_TABLET | Freq: Four times a day (QID) | ORAL | Status: DC | PRN
  Administered 2022-11-30: 650 mg via ORAL
  Filled 2022-11-30: qty 2

## 2022-11-30 NOTE — Progress Notes (Signed)
Heart Failure Navigator Progress Note  Assessed for Heart & Vascular TOC clinic readiness.  Patient does not meet criteria due to [facility] cardiology patient.   Navigator will sign off at this time.    Dawn Fields, BSN, RN Heart Failure Nurse Navigator Secure Chat Only   

## 2022-11-30 NOTE — Patient Outreach (Signed)
  Care Coordination   Follow Up Visit Note   11/30/2022 Name: SHAINAH TALWAR MRN: [id number] DOB: 1932/11/21  Marry Guan Welden is a 89 y.o. year old female who sees Garlan Fillers, MD for primary care. I reviewed patient's chart in preparation to contact patient for a nurse care coordination follow up.   What matters to the patients health and wellness today?  N/a    Goals Addressed               This Visit's Progress     Patient Stated     I have started a new medication for my heart failure (pt-stated)        Care Coordination Interventions: Reviewed chart in preparation to contact patient Noted patient is currently inpatient at [facility] with and admit date of 11/29/22; dx: CHF exacerbation         SDOH assessments and interventions completed:  No     Care Coordination Interventions:  Yes, provided   Follow up plan: Follow up call to be scheduled upon patient's discharge home  Encounter Outcome:  Pt. Visit Completed

## 2022-11-30 NOTE — Progress Notes (Signed)
Mobility Specialist Progress Note:    11/30/22 1400  Mobility  Activity Ambulated with assistance in hallway  Level of Assistance Contact guard assist, steadying assist  Assistive Device Front wheel walker  Distance Ambulated (ft) 150 ft  Activity Response Tolerated well  Mobility Referral Yes  $Mobility charge 1 Mobility  Mobility Specialist Start Time (ACUTE ONLY) 1224  Mobility Specialist Stop Time (ACUTE ONLY) 1242  Mobility Specialist Time Calculation (min) (ACUTE ONLY) 18 min   Pt received sitting EOB, agreeable to ambulate.pt needed no physical assistance throughout session. Session limited d/t dizziness, took BP at EOS and it was 156/88. Post ambulation pt requested to sit up in recliner. Call bell and personal belongings in reach. All needs met.  Thompson Grayer Mobility Specialist  Please contact vis Secure Chat or  Rehab Office [phone]

## 2022-11-30 NOTE — Progress Notes (Addendum)
PROGRESS NOTE    Lisa Mcclure  ZOX:[id number] DOB: 02-09-1933 DOA: 11/29/2022 PCP: Garlan Fillers, MD  Patient seen and examined, admitted earlier this morning by Dr. Loney Loh briefly 89/F with history of chronic diastolic CHF, moderate aortic stenosis, paroxysmal A-fib, COPD, obstructive sleep apnea, left atrial thrombus on Eliquis that was admitted earlier this morning with dyspnea on exertion 7 pound weight gain and edema from CHF.  Subjective: -Upset that none of her home medicines were resumed last night upon admission including her blood thinners. -Breathing a little better  Assessment and Plan:  Acute on chronic HFpEF Moderate aortic stenosis Acute hypoxemic respiratory failure -Last echo 3/24 with EF 60-65%, moderate aortic stenosis, normal RV, does not need repeat echo will discontinue -Continue IV Lasix today  -Restart Entresto,, Farxiga and carvedilol -Monitor I's/O, daily weights  Left atrial thrombus -Noted on last echo 3/24, restart Eliquis this morning, missed her dose last night   Paroxysmal A-fib -Restart Coreg, amiodarone and Eliquis this morning   Hypertension -Meds as above   COPD: Stable, no signs of acute exacerbation.  Hypothyroidism -Restart Synthroid  Depression/anxiety -Restart BuSpar  GERD  DVT prophylaxis: Apixaban Code Status: DNR Family Communication: No family at bedside Disposition Plan: Home likely 48 hours  Consultants:    Procedures:   Antimicrobials:    Objective: Vitals:   11/30/22 0100 11/30/22 0115 11/30/22 0250 11/30/22 0817  BP: (!) 141/69  138/67 134/71  Pulse:   82 74  Resp: 19 (!) 21  20  Temp:   98 F (36.7 C) 98.3 F (36.8 C)  TempSrc:   Oral Oral  SpO2:   99% 99%  Weight:   91.9 kg   Height:        Intake/Output Summary (Last 24 hours) at 11/30/2022 1030 Last data filed at 11/30/2022 0844 Gross per 24 hour  Intake 0 ml  Output 900 ml  Net -900 ml   Filed Weights   11/29/22 1727 11/30/22  0250  Weight: 93.4 kg 91.9 kg    Examination:  General exam: Obese plus female sitting up in bed, AAOx3 HEENT: Positive JVD CVS: S1-S2, irregular rhythm, systolic murmur  Lungs: Decreased breath sounds to bases Abdomen: Soft, nontender, bowel sounds present EXTR this: 1+ edema with minimal erythema bilaterally  Psychiatry:  Mood & affect appropriate.     Data Reviewed:   CBC: Recent Labs  Lab 11/29/22 1733  WBC 7.5  HGB 13.0  HCT 40.4  MCV 96.0  PLT 255   Basic Metabolic Panel: Recent Labs  Lab 11/29/22 1733 11/30/22 0727  NA 138 134*  K 4.2 3.6  CL 100 98  CO2 28 25  GLUCOSE 96 88  BUN 24* 24*  CREATININE 0.85 0.95  CALCIUM 8.7* 8.3*   GFR: Estimated Creatinine Clearance: 44.9 mL/min (by C-G formula based on SCr of 0.95 mg/dL). Liver Function Tests: Recent Labs  Lab 11/29/22 1733  AST 18  ALT 9  ALKPHOS 86  BILITOT 0.4  PROT 7.3  ALBUMIN 4.0   No results for input(s): "LIPASE", "AMYLASE" in the last 168 hours. No results for input(s): "AMMONIA" in the last 168 hours. Coagulation Profile: No results for input(s): "INR", "PROTIME" in the last 168 hours. Cardiac Enzymes: No results for input(s): "CKTOTAL", "CKMB", "CKMBINDEX", "TROPONINI" in the last 168 hours. BNP (last 3 results) Recent Labs    06/28/22 1313  PROBNP 583   HbA1C: No results for input(s): "HGBA1C" in the last 72 hours. CBG: No results for input(s): "  GLUCAP" in the last 168 hours. Lipid Profile: No results for input(s): "CHOL", "HDL", "LDLCALC", "TRIG", "CHOLHDL", "LDLDIRECT" in the last 72 hours. Thyroid Function Tests: No results for input(s): "TSH", "T4TOTAL", "FREET4", "T3FREE", "THYROIDAB" in the last 72 hours. Anemia Panel: No results for input(s): "VITAMINB12", "FOLATE", "FERRITIN", "TIBC", "IRON", "RETICCTPCT" in the last 72 hours. Urine analysis:    Component Value Date/Time   COLORURINE STRAW (A) 09/13/2018 1740   APPEARANCEUR CLEAR 09/13/2018 1740   LABSPEC  1.008 09/13/2018 1740   PHURINE 7.0 09/13/2018 1740   GLUCOSEU NEGATIVE 09/13/2018 1740   HGBUR NEGATIVE 09/13/2018 1740   BILIRUBINUR NEGATIVE 09/13/2018 1740   KETONESUR NEGATIVE 09/13/2018 1740   PROTEINUR NEGATIVE 09/13/2018 1740   UROBILINOGEN 0.2 01/06/2009 0808   NITRITE NEGATIVE 09/13/2018 1740   LEUKOCYTESUR NEGATIVE 09/13/2018 1740   Sepsis Labs: @LABRCNTIP (procalcitonin:4,lacticidven:4)  ) Recent Results (from the past 240 hour(s))  Resp panel by RT-PCR (RSV, Flu A&B, Covid) Anterior Nasal Swab     Status: None   Collection Time: 11/29/22  8:59 PM   Specimen: Anterior Nasal Swab  Result Value Ref Range Status   SARS Coronavirus 2 by RT PCR NEGATIVE NEGATIVE Final    Comment: (NOTE) SARS-CoV-2 target nucleic acids are NOT DETECTED.  The SARS-CoV-2 RNA is generally detectable in upper respiratory specimens during the acute phase of infection. The lowest concentration of SARS-CoV-2 viral copies this assay can detect is 138 copies/mL. A negative result does not preclude SARS-Cov-2 infection and should not be used as the sole basis for treatment or other patient management decisions. A negative result may occur with  improper specimen collection/handling, submission of specimen other than nasopharyngeal swab, presence of viral mutation(s) within the areas targeted by this assay, and inadequate number of viral copies(<138 copies/mL). A negative result must be combined with clinical observations, patient history, and epidemiological information. The expected result is Negative.  Fact Sheet for Patients:  BloggerCourse.com  Fact Sheet for Healthcare Providers:  SeriousBroker.it  This test is no t yet approved or cleared by the [place] FDA and  has been authorized for detection and/or diagnosis of SARS-CoV-2 by FDA under an Emergency Use Authorization (EUA). This EUA will remain  in effect (meaning this test can be  used) for the duration of the COVID-19 declaration under Section 564(b)(1) of the Act, 21 U.S.C.section 360bbb-3(b)(1), unless the authorization is terminated  or revoked sooner.       Influenza A by PCR NEGATIVE NEGATIVE Final   Influenza B by PCR NEGATIVE NEGATIVE Final    Comment: (NOTE) The Xpert Xpress SARS-CoV-2/FLU/RSV plus assay is intended as an aid in the diagnosis of influenza from Nasopharyngeal swab specimens and should not be used as a sole basis for treatment. Nasal washings and aspirates are unacceptable for Xpert Xpress SARS-CoV-2/FLU/RSV testing.  Fact Sheet for Patients: BloggerCourse.com  Fact Sheet for Healthcare Providers: SeriousBroker.it  This test is not yet approved or cleared by the [place] FDA and has been authorized for detection and/or diagnosis of SARS-CoV-2 by FDA under an Emergency Use Authorization (EUA). This EUA will remain in effect (meaning this test can be used) for the duration of the COVID-19 declaration under Section 564(b)(1) of the Act, 21 U.S.C. section 360bbb-3(b)(1), unless the authorization is terminated or revoked.     Resp Syncytial Virus by PCR NEGATIVE NEGATIVE Final    Comment: (NOTE) Fact Sheet for Patients: BloggerCourse.com  Fact Sheet for Healthcare Providers: SeriousBroker.it  This test is not yet approved or cleared by  the [employer] and has been authorized for detection and/or diagnosis of SARS-CoV-2 by FDA under an Emergency Use Authorization (EUA). This EUA will remain in effect (meaning this test can be used) for the duration of the COVID-19 declaration under Section 564(b)(1) of the Act, 21 U.S.C. section 360bbb-3(b)(1), unless the authorization is terminated or revoked.  Performed at [facility], [address] [place], [place] [address]      Radiology Studies: [facility] Chest  [facility] 1 View  Result Date: 11/29/2022 CLINICAL DATA:  Shortness of breath EXAM: PORTABLE CHEST 1 VIEW COMPARISON:  Chest x-ray 06/19/2022 CT of the chest 06/18/2021 FINDINGS:.: FINDINGS:. The heart is enlarged. There central interstitial prominence. There is no lung consolidation, pleural effusion or pneumothorax. Degenerative changes affect both shoulders. IMPRESSION: Cardiomegaly with central interstitial prominence, likely due to pulmonary edema. Electronically Signed   By: Darliss Cheney M.D.   On: 11/29/2022 21:15     Scheduled Meds:  amiodarone  100 mg Oral Daily   apixaban  2.5 mg Oral BID   busPIRone  7.5 mg Oral Daily   carvedilol  3.125 mg Oral BID WC   dapagliflozin propanediol  10 mg Oral Daily   furosemide  40 mg Intravenous BID   levothyroxine  125 mcg Oral Q0600   sacubitril-valsartan  1 tablet Oral BID   Continuous Infusions:   LOS: 0 days    Time spent:    Zannie Cove, MD [facility]   11/30/2022, 10:30 AM

## 2022-11-30 NOTE — H&P (Signed)
History and Physical    Lisa Mcclure ZOX:[id number] DOB: 03-Jan-1933 DOA: 11/29/2022  PCP: Garlan Fillers, MD  Patient coming from: [facility] ED  Chief Complaint: Shortness of breath  HPI: Lisa Mcclure is a 89 y.o. female with medical history significant of chronic HFpEF, moderate aortic stenosis, paroxysmal A-fib on Eliquis, COPD, OSA intolerant to CPAP on nocturnal O2, hypertension, obesity, RLS, depression, anxiety, hypothyroidism, GERD, rheumatoid arthritis, venous insufficiency, status post AAA repair in September 2021 presented to the ED with complaints of shortness of breath, orthopnea, and bilateral lower extremity edema.  Not hypoxic at rest but oxygen saturation dropped to 85% with ambulation on room air.  Afebrile.  Labs showing no leukocytosis, creatinine 0.8, BNP 307, troponin negative x 2, COVID/influenza/RSV PCR negative.  Chest x-ray showing cardiomegaly with central interstitial prominence likely due to pulmonary edema. Bedside echo performed by ED physician showing small pericardial effusion and no signs of tamponade. Patient received IV Lasix 40 mg and Norco in the ED.  Patient states she saw her PCP a week ago due to bilateral lower extremity edema and the dose of her Lasix was doubled from 20 mg daily to 40 mg daily.  However, her bilateral lower extremity edema has not improved and she has now developed shortness of breath and orthopnea.  She has gained about 7 pounds in the past month despite taking her medications and watching her dietary sodium and fluid intake.  Her cardiologist is Dr. Jacinto Halim.  Denies chest pain.  She reports history of chronic bilateral lower extremity erythema for which she has been treated with antibiotics multiple times in the past without improvement.  Denies history of blood clots.  She reports history of OSA with CPAP intolerance and uses 2 L nocturnal oxygen.  Review of Systems:  Review of Systems  All other systems reviewed and are  negative.   Past Medical History:  Diagnosis Date   Acute renal failure superimposed on stage 3b chronic kidney disease (HCC) 06/15/2021   Anemia    h/o hemorrhoidal bleeding and blood transfusion   Chronic diastolic CHF (congestive heart failure) (HCC)    Depression    Diverticulosis    DOE (dyspnea on exertion)    Esophageal reflux    GERD (gastroesophageal reflux disease)    Hypothyroidism    Insomnia    LBP (low back pain)    OAB (overactive bladder)    Obesity    OSA (obstructive sleep apnea)    Osteoarthritis    Paroxysmal atrial fibrillation (HCC)    Rheumatoid arthritis(714.0)    Shoulder pain, bilateral    Unspecified essential hypertension    Venous insufficiency     Past Surgical History:  Procedure Laterality Date   ABDOMINAL AORTIC ENDOVASCULAR STENT GRAFT N/A 12/20/2019   Procedure: Aortogram including catheter selection of aorta and bilateral iliac arteriogram, Endovascular repair of infrarenal abdominal aortic aneurysm with bifurcated stent graft (26 mm x 14 x 12 main body, right bell bottom with a 20 mm x 10 cm piece, and left bell bottom with a 16 mm x 12 cm piece) ;  Surgeon: Cephus Shelling, MD;  Location: [facility] OR;  Service: Vascular;  Laterality: N/A;   APPENDECTOMY  1953   BIOPSY  06/18/2021   Procedure: BIOPSY;  Surgeon: Kerin Salen, MD;  Location: [facility] ENDOSCOPY;  Service: Gastroenterology;;   bladder abduction-1996  1996   breast biopsy Right 1980   CARDIOVERSION N/A 12/24/2019   Procedure: CARDIOVERSION;  Surgeon: Yates Decamp, MD;  Location: [facility] ENDOSCOPY;  Service: Cardiovascular;  Laterality: N/A;   CARDIOVERSION N/A 04/25/2022   Procedure: CARDIOVERSION;  Surgeon: Yates Decamp, MD;  Location: [facility] ENDOSCOPY;  Service: Cardiovascular;  Laterality: N/A;   CESAREAN SECTION     1957, 1961, 1964   COSMETIC SURGERY  1996   CYSTOCELE REPAIR     ESOPHAGOGASTRODUODENOSCOPY (EGD) WITH PROPOFOL N/A 06/18/2021   Procedure: ESOPHAGOGASTRODUODENOSCOPY (EGD) WITH  PROPOFOL;  Surgeon: Kerin Salen, MD;  Location: [facility] ENDOSCOPY;  Service: Gastroenterology;  Laterality: N/A;   FLEXIBLE SIGMOIDOSCOPY N/A 04/10/2015   Procedure: FLEXIBLE SIGMOIDOSCOPY;  Surgeon: Willis Modena, MD;  Location: [facility] ENDOSCOPY;  Service: Endoscopy;  Laterality: N/A;   IR AORTAGRAM ABDOMINAL SERIALOGRAM  10/26/2022   IR EMBO ARTERIAL NOT HEMORR HEMANG INC GUIDE ROADMAPPING  10/24/2022   IR RADIOLOGIST EVAL & MGMT  09/13/2022   IR RADIOLOGIST EVAL & MGMT  11/24/2022   IR US GUIDE VASC ACCESS LEFT  10/26/2022   IR US GUIDE VASC ACCESS LEFT  10/26/2022   IR US GUIDE VASC ACCESS LEFT  10/26/2022   IR US GUIDE VASC ACCESS LEFT  10/26/2022   knee arthroscopy Right 1996, 2010   KNEE ARTHROSCOPY W/ AUTOGENOUS CARTILAGE IMPLANTATION (ACI) PROCEDURE Left 1994, 1995   REFRACTIVE SURGERY  01/2020   SHOULDER SURGERY  1990   TEE WITHOUT CARDIOVERSION N/A 12/24/2019   Procedure: TRANSESOPHAGEAL ECHOCARDIOGRAM (TEE);  Surgeon: Yates Decamp, MD;  Location: [facility] ENDOSCOPY;  Service: Cardiovascular;  Laterality: N/A;   TEE WITHOUT CARDIOVERSION N/A 06/23/2022   Procedure: TRANSESOPHAGEAL ECHOCARDIOGRAM (TEE);  Surgeon: Clotilde Dieter, DO;  Location: [facility] ENDOSCOPY;  Service: Cardiovascular;  Laterality: N/A;   TOTAL ABDOMINAL HYSTERECTOMY  1972   ULTRASOUND GUIDANCE FOR VASCULAR ACCESS Bilateral 12/20/2019   Procedure: Ultrasound-guided access of bilateral common femoral arteries for delivery of endograft and percutaneous closure;  Surgeon: Cephus Shelling, MD;  Location: [facility] OR;  Service: Vascular;  Laterality: Bilateral;   VESICOVAGINAL FISTULA CLOSURE W/ TAH     WRIST SURGERY  1967     reports that she quit smoking about 38 years ago. Her smoking use included cigarettes. She started smoking about 48 years ago. She has a 2 pack-year smoking history. She has been exposed to tobacco smoke. She has never used smokeless tobacco. She reports current alcohol use. She reports that she does not use  drugs.  Allergies  Allergen Reactions   Statins Other (See Comments)    Muscle aches and INTERNAL BLEEDING   Atorvastatin Other (See Comments)    Myalgias   Gabapentin Other (See Comments)    "Made me loopy"   Methocarbamol Other (See Comments)    "Made me loopy"    Family History  Problem Relation Age of Onset   Lung cancer Mother    COPD Father    Breast cancer Maternal Aunt    Heart attack Maternal Grandmother 69   Lung cancer Son     Prior to Admission medications   Medication Sig Start Date End Date Taking? Authorizing Provider  ALEVE ARTHRITIS PAIN 1 % GEL Apply 2 g topically 4 (four) times daily as needed (for pain).    [provider]  amiodarone (PACERONE) 100 MG tablet TAKE ONE TABLET BY MOUTH EVERY OTHER DAY AS DIRECTED 10/03/22   Yates Decamp, MD  apixaban (ELIQUIS) 2.5 MG TABS tablet Take 1 tablet (2.5 mg total) by mouth 2 (two) times daily. Patient taking differently: Take 2.5 mg by mouth in the morning and at bedtime. 02/25/22   Jacinto Halim,  Vonna Kotyk, MD  Biotin-Vitamin C (HAIR SKIN NAILS GUMMIES) 1250-50 MCG-MG CHEW 2 gummies Orally once a day 10/06/22   [provider]  budesonide (PULMICORT) 0.5 MG/2ML nebulizer solution Take 2 mLs (0.5 mg total) by nebulization in the morning and at bedtime. Patient taking differently: Take 0.5 mg by nebulization daily. 03/22/22   Glenford Bayley, NP  busPIRone (BUSPAR) 7.5 MG tablet Take 7.5 mg by mouth See admin instructions. Take 7.5 mg by mouth at bedtime and an additional 7.5 mg once a day as needed for anxiety    [provider]  carvedilol (COREG) 3.125 MG tablet Take 1 tablet (3.125 mg total) by mouth 2 (two) times daily with a meal. 11/03/22   Yates Decamp, MD  dapagliflozin propanediol (FARXIGA) 10 MG TABS tablet Take 1 tablet (10 mg total) by mouth daily before breakfast. 01/27/22   Yates Decamp, MD  diclofenac sodium (VOLTAREN) 1 % GEL Apply 2.25 g topically 3 (three) times daily as needed (for pain).  10/23/18   [provider]  furosemide (LASIX) 20 MG tablet TAKE TWO TABLETS BY MOUTH ONCE DAILY AS NEEDED fluid 10/03/22   Yates Decamp, MD  HYDROcodone-acetaminophen (NORCO) 7.5-325 MG tablet Take 1 tablet by mouth every 6 (six) hours as needed for moderate pain. 04/13/15   Vassie Loll, MD  ipratropium (ATROVENT) 0.03 % nasal spray Place 2 sprays into both nostrils every 12 (twelve) hours. Patient taking differently: Place 2 sprays into both nostrils every 12 (twelve) hours as needed for rhinitis. 03/22/22   Glenford Bayley, NP  latanoprost (XALATAN) 0.005 % ophthalmic solution Place 1 drop into both eyes at bedtime.    [provider]  levothyroxine (SYNTHROID) 125 MCG tablet Take 125 mcg by mouth daily before breakfast.     [provider]  melatonin 3 MG TABS tablet Take 3 mg by mouth at bedtime.    [provider]  methocarbamol (ROBAXIN) 500 MG tablet Take 500 mg by mouth every 6 (six) hours as needed. Patient not taking: Reported on 11/03/2022 08/31/22   [provider]  Multiple Vitamins-Minerals (PRESERVISION AREDS 2 PO) Take by mouth.    [provider]  nortriptyline (PAMELOR) 10 MG capsule Take 10 mg by mouth daily. 08/31/22   [provider]  rOPINIRole (REQUIP) 1 MG tablet Take 1 mg by mouth See admin instructions. Take 1 mg by mouth at 1 PM and 2 mg at 9 PM 06/19/15   [provider]  sacubitril-valsartan (ENTRESTO) 24-26 MG Take 1 tablet by mouth 2 (two) times daily. 06/25/22   Zannie Cove, MD  SYSTANE COMPLETE PF 0.6 % SOLN Place 1 drop into both eyes 4 (four) times daily as needed (for dryness).    [provider]    Physical Exam: Vitals:   11/30/22 0045 11/30/22 0100 11/30/22 0115 11/30/22 0250  BP: 127/84 (!) 141/69  138/67  Pulse:    82  Resp: 20 19 (!) 21   Temp:    98 F (36.7 C)  TempSrc:    Oral  SpO2:    99%  Weight:    91.9 kg  Height:        Physical Exam Vitals reviewed.   Constitutional:      General: She is not in acute distress. HENT:     Head: Normocephalic and atraumatic.  Eyes:     Extraocular Movements: Extraocular movements intact.  Neck:     Comments: +JVD Cardiovascular:     Rate and Rhythm:  Normal rate and regular rhythm.     Pulses: Normal pulses.     Heart sounds: Murmur heard.  Pulmonary:     Effort: Pulmonary effort is normal. No respiratory distress.     Breath sounds: Rales present. No wheezing.  Abdominal:     General: Bowel sounds are normal. There is no distension.     Palpations: Abdomen is soft.     Tenderness: There is no abdominal tenderness.  Musculoskeletal:     Cervical back: Normal range of motion.     Right lower leg: Edema present.     Left lower leg: Edema present.     Comments: 1+ pitting edema of bilateral lower extremities.  Skin:    General: Skin is warm and dry.     Comments: Bilateral lower legs erythematous and warm to touch.  Venous stasis dermatitis changes of skin.  Neurological:     General: No focal deficit present.     Mental Status: She is alert and oriented to person, place, and time.     Labs on Admission: I have personally reviewed following labs and imaging studies  CBC: Recent Labs  Lab 11/29/22 1733  WBC 7.5  HGB 13.0  HCT 40.4  MCV 96.0  PLT 255   Basic Metabolic Panel: Recent Labs  Lab 11/29/22 1733  NA 138  K 4.2  CL 100  CO2 28  GLUCOSE 96  BUN 24*  CREATININE 0.85  CALCIUM 8.7*   GFR: Estimated Creatinine Clearance: 50.2 mL/min (by C-G formula based on SCr of 0.85 mg/dL). Liver Function Tests: Recent Labs  Lab 11/29/22 1733  AST 18  ALT 9  ALKPHOS 86  BILITOT 0.4  PROT 7.3  ALBUMIN 4.0   No results for input(s): "LIPASE", "AMYLASE" in the last 168 hours. No results for input(s): "AMMONIA" in the last 168 hours. Coagulation Profile: No results for input(s): "INR", "PROTIME" in the last 168 hours. Cardiac Enzymes: No results for input(s): "CKTOTAL",  "CKMB", "CKMBINDEX", "TROPONINI" in the last 168 hours. BNP (last 3 results) Recent Labs    06/28/22 1313  PROBNP 583   HbA1C: No results for input(s): "HGBA1C" in the last 72 hours. CBG: No results for input(s): "GLUCAP" in the last 168 hours. Lipid Profile: No results for input(s): "CHOL", "HDL", "LDLCALC", "TRIG", "CHOLHDL", "LDLDIRECT" in the last 72 hours. Thyroid Function Tests: No results for input(s): "TSH", "T4TOTAL", "FREET4", "T3FREE", "THYROIDAB" in the last 72 hours. Anemia Panel: No results for input(s): "VITAMINB12", "FOLATE", "FERRITIN", "TIBC", "IRON", "RETICCTPCT" in the last 72 hours. Urine analysis:    Component Value Date/Time   COLORURINE STRAW (A) 09/13/2018 1740   APPEARANCEUR CLEAR 09/13/2018 1740   LABSPEC 1.008 09/13/2018 1740   PHURINE 7.0 09/13/2018 1740   GLUCOSEU NEGATIVE 09/13/2018 1740   HGBUR NEGATIVE 09/13/2018 1740   BILIRUBINUR NEGATIVE 09/13/2018 1740   KETONESUR NEGATIVE 09/13/2018 1740   PROTEINUR NEGATIVE 09/13/2018 1740   UROBILINOGEN 0.2 01/06/2009 0808   NITRITE NEGATIVE 09/13/2018 1740   LEUKOCYTESUR NEGATIVE 09/13/2018 1740    Radiological Exams on Admission: DG Chest Port 1 View  Result Date: 11/29/2022 CLINICAL DATA:  Shortness of breath EXAM: PORTABLE CHEST 1 VIEW COMPARISON:  Chest x-ray 06/19/2022 CT of the chest 06/18/2021 FINDINGS:.: FINDINGS:. The heart is enlarged. There central interstitial prominence. There is no lung consolidation, pleural effusion or pneumothorax. Degenerative changes affect both shoulders. IMPRESSION: Cardiomegaly with central interstitial prominence, likely due to pulmonary edema. Electronically Signed   By: Mcneil Sober.D.  On: 11/29/2022 21:15    EKG: Independently reviewed.  Sinus rhythm with first-degree AV block, PVCs.  No acute ischemic changes.  Assessment and Plan  Acute on chronic HFpEF Moderate aortic stenosis Acute hypoxemic respiratory failure Patient presenting with complaints  of dyspnea, orthopnea, bilateral lower extremity edema, and weight gain.  Dose of home Lasix was doubled from 20 to 40 mg daily a week ago.  Not hypoxic at rest but oxygen saturation dropped to 85% with ambulation on room air.  Echo done in March 2024 showing EF 60 to 65% and moderate aortic stenosis.  BNP 307.  Chest x-ray showing cardiomegaly and pulmonary edema. Bedside echo performed by ED physician showing small pericardial effusion and no signs of tamponade. Continue diuresis with IV Lasix 40 mg twice daily.  Repeat TTE ordered.  Monitor intake and output, daily weights, and low-sodium diet with fluid restriction.  Continuous pulse ox, supplemental oxygen as needed.  Consult cardiology in a.m.  Bilateral lower extremity erythema and warmth to touch Patient states this is a chronic issue and she has been treated with antibiotics multiple times without any improvement.  Suspect this is related to chronic venous stasis dermatitis rather than cellulitis given no fever or leukocytosis.  Paroxysmal A-fib Continue Coreg, amiodarone, and Eliquis after pharmacy med rec is done.  Hypertension Systolic currently in the 170s.  Continue IV diuresis and resume home oral antihypertensive medications after pharmacy med rec is done.  COPD: Stable, no signs of acute exacerbation. RLS Hypothyroidism Depression/anxiety GERD Rheumatoid arthritis Pharmacy med rec pending.  DVT prophylaxis: Continue Eliquis after pharmacy med rec is done. Code Status: DNR/DNI (discussed with the patient) Family Communication: No family available at this time. Level of care: Telemetry bed Admission status: It is my clinical opinion that admission to INPATIENT is reasonable and necessary because of the expectation that this patient will require hospital care that crosses at least 2 midnights to treat this condition based on the medical complexity of the problems presented.  Given the aforementioned information, the  predictability of an adverse outcome is felt to be significant.   John Giovanni MD [facility]  If 7PM-7AM, please contact night-coverage [email or web address]  11/30/2022, 6:05 AM

## 2022-11-30 NOTE — Plan of Care (Signed)

## 2022-11-30 NOTE — TOC Initial Note (Addendum)
Transition of Care [facility]) - Initial/Assessment Note    Patient Details  Name: Lisa Mcclure MRN: [id number] Date of Birth: 1933-01-13  [facility] of [facility]) CM/SW Contact:    Lawerance Sabal, RN Phone Number: 11/30/2022, 11:14 AM  Clinical Narrative:                  Chart and previous encounters reviewed. Unable to reach patient's daughter by phone.  Confirmed w HH agency that patient is active for RN PT OT with [facility].  Per chart review patient has nocturnal oxygen through [employer].  TOC will continue to follow for DC needs  Spoke w patient at bedside, she states that she would like to switch to [facility]. I have notified liaisons from both companies  Expected Discharge Plan: Home w Home Health Services Barriers to Discharge: Continued Medical Work up   Patient Goals and CMS Choice            Expected Discharge Plan and Services   Discharge Planning Services: CM Consult   Living arrangements for the past 2 months: Single Family Home                           HH Arranged: RN, PT, OT HH Agency: [facility] Date [facility] Agency Contacted: 11/30/22 Time HH Agency Contacted: [phone] Representative spoke with at [facility] Agency: Tresa Endo  Prior Living Arrangements/Services Living arrangements for the past 2 months: Single Family Home Lives with:: Self              Current home services: DME (walker, lift chair, high toilet, grab bars, oxygen at night 2 liters ([employer]),)    Activities of Daily Living Home Assistive Devices/Equipment: Walker (specify type) ADL Screening (condition at time of admission) Patient's cognitive ability adequate to safely complete daily activities?: Yes Is the patient deaf or have difficulty hearing?: No Does the patient have difficulty seeing, even when wearing glasses/contacts?: No Does the patient have difficulty concentrating, remembering, or making decisions?: No Patient able to express need for assistance with ADLs?:  Yes Does the patient have difficulty dressing or bathing?: No Independently performs ADLs?: Yes (appropriate for developmental age) Does the patient have difficulty walking or climbing stairs?: Yes Weakness of Legs: Both Weakness of Arms/Hands: None  Permission Sought/Granted                  Emotional Assessment              Admission diagnosis:  CHF exacerbation (HCC) [I50.9] Acute on chronic congestive heart failure, unspecified heart failure type (HCC) [I50.9] Patient Active Problem List   Diagnosis Date Noted   CHF exacerbation (HCC) 11/29/2022   Acute exacerbation of CHF (congestive heart failure) (HCC) 06/19/2022   Acute on chronic diastolic heart failure (HCC) 04/25/2022   Chronic renal failure, stage 3b (HCC) 04/25/2022   Class 2 obesity 04/25/2022   Persistent atrial fibrillation (HCC) 04/22/2022   Chronic cough 03/28/2022   Acute hypoxemic respiratory failure (HCC) 06/15/2021   Paroxysmal atrial fibrillation (HCC) 06/15/2021   Acute renal failure with acute tubular necrosis superimposed on stage 3b chronic kidney disease (HCC) 06/15/2021   Generalized weakness 06/15/2021   Atrial fibrillation with RVR (HCC)    Long term (current) use of anticoagulants    Long term current use of antiarrhythmic drug    AAA (abdominal aortic aneurysm) without rupture (HCC) 12/20/2019   AAA (abdominal aortic aneurysm) (HCC) 12/20/2019   Intolerance  of continuous positive airway pressure (CPAP) ventilation 11/26/2019   Severe hypoxemia 08/21/2019   Severe obstructive sleep apnea 07/29/2019   Chronic obstructive pulmonary disease (HCC) 07/29/2019   RLS (restless legs syndrome) 07/29/2019   Dehydration with hyponatremia 09/13/2018   Bradycardia 09/13/2018   Glaucoma 09/13/2018   OA (osteoarthritis) of shoulder 05/16/2018   Incomplete emptying of bladder 05/17/2017   Labial adhesions 05/17/2017   Mixed stress and urge urinary incontinence 05/17/2017   Urethral sphincter  deficiency, intrinsic (ISD) 05/17/2017   Aortic stenosis 01/15/2016   Bilateral carotid bruits 05/11/2015   Hyponatremia 04/19/2015   Symptomatic anemia 04/19/2015   Panic attack 04/19/2015   Constipation 04/19/2015   Hyperkalemia 04/17/2015   Influenza A 04/16/2015   Proximal humerus fracture    Left patella fracture    Benign essential HTN    Esophageal reflux    Chronic heart failure with preserved ejection fraction (HFpEF) (HCC)    Hypothyroidism    Lower GI bleed 04/08/2015   Hematochezia    Rectal bleeding 04/03/2015   Patellar fracture 03/06/2015   Left humeral fracture 11/04/2013   Rib fracture 11/04/2013   Hypothyroid 11/04/2013   Hypertension 11/04/2013   Right shoulder pain 11/04/2013   OSA (obstructive sleep apnea) 11/13/2008   BOOP (bronchiolitis obliterans with organizing pneumonia) (HCC) 08/25/2008   PCP:  Garlan Fillers, MD Pharmacy:   [facility] - [place], [place] - [address] [place] [address] Phone: [phone] Fax: [phone]  [facility] [address] [place] [address] Phone: [phone] Fax: [phone]     Social Determinants of Health (SDOH) Social History: SDOH Screenings   Food Insecurity: No Food Insecurity (11/30/2022)  Housing: Low Risk  (11/30/2022)  Transportation Needs: No Transportation Needs (11/30/2022)  Utilities: Not At Risk (11/30/2022)  Alcohol Screen: Low Risk  (08/11/2021)  Depression (PHQ2-9): Medium Risk (08/11/2021)  Financial Resource Strain: Low Risk  (08/11/2021)  Physical Activity: Inactive (08/11/2021)  Social Connections: Moderately Isolated (08/11/2021)  Stress: No Stress Concern Present (08/11/2021)  Tobacco Use: Medium Risk (11/29/2022)   SDOH Interventions:     Readmission Risk Interventions    06/21/2022    4:57 PM  Readmission Risk Prevention Plan  Transportation Screening Complete  PCP or Specialist Appt  within 3-5 Days Complete  HRI or Home Care Consult Complete  Palliative Care Screening Not Applicable  Medication Review (RN Care Manager) Complete

## 2022-12-01 ENCOUNTER — Ambulatory Visit: Payer: Medicare Other | Admitting: Family Medicine

## 2022-12-01 DIAGNOSIS — I5033 Acute on chronic diastolic (congestive) heart failure: Secondary | ICD-10-CM | POA: Diagnosis not present

## 2022-12-01 LAB — BASIC METABOLIC PANEL
Anion gap: 14 (ref 5–15)
BUN: 33 mg/dL — ABNORMAL HIGH (ref 8–23)
CO2: 26 mmol/L (ref 22–32)
Calcium: 8.2 mg/dL — ABNORMAL LOW (ref 8.9–10.3)
Chloride: 96 mmol/L — ABNORMAL LOW (ref 98–111)
Creatinine, Ser: 1.08 mg/dL — ABNORMAL HIGH (ref 0.44–1.00)
GFR, Estimated: 50 mL/min — ABNORMAL LOW (ref >60)
Glucose, Bld: 92 mg/dL (ref 70–99)
Potassium: 3.8 mmol/L (ref 3.5–5.1)
Sodium: 136 mmol/L (ref 135–145)

## 2022-12-01 MED ORDER — POLYVINYL ALCOHOL 1.4 % OP SOLN
1.0000 [drp] | Freq: Four times a day (QID) | OPHTHALMIC | Status: DC | PRN
  Filled 2022-12-01: qty 15

## 2022-12-01 MED ORDER — LATANOPROST 0.005 % OP SOLN
1.0000 [drp] | Freq: Every day | OPHTHALMIC | Status: DC
  Administered 2022-12-02: 1 [drp] via OPHTHALMIC
  Filled 2022-12-01 (×2): qty 2.5

## 2022-12-01 NOTE — Progress Notes (Signed)
PROGRESS NOTE    Lisa Mcclure  ZOX:[id number]  DOB: November 25, 1932  DOA: 11/29/2022 PCP: Garlan Fillers, MD Outpatient Specialists:   Hospital course:  89 year old with HFpEF, moderate AS, PAF, COPD and OSA with known left atrial thrombus on Eliquis was admitted for decompensated heart failure.   Subjective:  Patient states that she feels much better than when she came in and thinks that she is almost back to baseline.  Notes that at baseline she is able to walk around her house slowly with a walker.  Has not yet gotten up out of the recliner or walked to the bathroom here yet.  Objective: Vitals:   12/01/22 0837 12/01/22 0838 12/01/22 1241 12/01/22 1825  BP: (!) 139/107 136/81 103/77 119/67  Pulse: (!) 27  62 70  Resp:   18 18  Temp:   98.4 F (36.9 C) 98.4 F (36.9 C)  TempSrc:   Oral Oral  SpO2: 90%  92% 95%  Weight:      Height:        Intake/Output Summary (Last 24 hours) at 12/01/2022 1840 Last data filed at 12/01/2022 8119 Gross per 24 hour  Intake 355 ml  Output 700 ml  Net -345 ml   Filed Weights   11/29/22 1727 11/30/22 0250 12/01/22 0520  Weight: 93.4 kg 91.9 kg 89.9 kg     Exam:  General: Friendly patient sitting up in recliner in NAD Eyes: sclera anicteric, conjuctiva mild injection bilaterally CVS: S1-S2, regular  Respiratory:  decreased air entry bilaterally secondary to decreased inspiratory effort, rales at bases  GI: NABS, soft, NT  LE: Warm and well-perfused Neuro: A/O x 3,  grossly nonfocal.  Psych: patient is logical and coherent, judgement and insight appear normal, mood and affect appropriate to situation.  Data Reviewed:  Basic Metabolic Panel: Recent Labs  Lab 11/29/22 1733 11/30/22 0727 12/01/22 0027  NA 138 134* 136  K 4.2 3.6 3.8  CL 100 98 96*  CO2 28 25 26   GLUCOSE 96 88 92  BUN 24* 24* 33*  CREATININE 0.85 0.95 1.08*  CALCIUM 8.7* 8.3* 8.2*    CBC: Recent Labs  Lab 11/29/22 1733  WBC 7.5  HGB 13.0   HCT 40.4  MCV 96.0  PLT 255     Scheduled Meds:  amiodarone  100 mg Oral Daily   apixaban  2.5 mg Oral BID   busPIRone  7.5 mg Oral Daily   carvedilol  3.125 mg Oral BID WC   dapagliflozin propanediol  10 mg Oral Daily   furosemide  40 mg Intravenous BID   latanoprost  1 drop Both Eyes QHS   levothyroxine  125 mcg Oral Q0600   melatonin  3 mg Oral QHS   rOPINIRole  1 mg Oral Daily   rOPINIRole  2 mg Oral QHS   sacubitril-valsartan  1 tablet Oral BID   Continuous Infusions:   Assessment & Plan:   Decompensated HFpEF Moderate AS Patient is responding well to diuresis with Lasix 40 IV twice daily Entresto, carvedilol and Farxiga were also started and she is tolerating these well's Patient thinks she is almost back to baseline With walk patient with O2 sat monitor tomorrow and see how she does  Left atrial thrombus On Eliquis  PAF HTN Rate and BP are controlled on Coreg and amiodarone Eliquis for secondary prevention of stroke  Hypothyroidism Continue Synthroid  Anxiety and depression Continue BuSpar   DVT prophylaxis: Eliquis Code Status: DNR Family Communication: None  today     Studies: DG Chest Port 1 View  Result Date: 11/29/2022 CLINICAL DATA:  Shortness of breath EXAM: PORTABLE CHEST 1 VIEW COMPARISON:  Chest x-ray 06/19/2022 CT of the chest 06/18/2021 FINDINGS:.: FINDINGS:. The heart is enlarged. There central interstitial prominence. There is no lung consolidation, pleural effusion or pneumothorax. Degenerative changes affect both shoulders. IMPRESSION: Cardiomegaly with central interstitial prominence, likely due to pulmonary edema. Electronically Signed   By: Darliss Cheney M.D.   On: 11/29/2022 21:15    Principal Problem:   CHF exacerbation (HCC) Active Problems:   Acute hypoxemic respiratory failure (HCC)   Paroxysmal atrial fibrillation (HCC)   Chronic obstructive pulmonary disease (HCC)   Benign essential HTN   Esophageal reflux   Aortic  stenosis     Dhalia Zingaro Orma Flaming, [facility]  If 7PM-7AM, please contact night-coverage [email or web address]   LOS: 1 day

## 2022-12-02 DIAGNOSIS — I480 Paroxysmal atrial fibrillation: Secondary | ICD-10-CM | POA: Diagnosis not present

## 2022-12-02 DIAGNOSIS — J9601 Acute respiratory failure with hypoxia: Secondary | ICD-10-CM

## 2022-12-02 DIAGNOSIS — I5033 Acute on chronic diastolic (congestive) heart failure: Secondary | ICD-10-CM | POA: Diagnosis not present

## 2022-12-02 DIAGNOSIS — J449 Chronic obstructive pulmonary disease, unspecified: Secondary | ICD-10-CM | POA: Diagnosis not present

## 2022-12-02 LAB — BASIC METABOLIC PANEL
Anion gap: 11 (ref 5–15)
BUN: 33 mg/dL — ABNORMAL HIGH (ref 8–23)
CO2: 29 mmol/L (ref 22–32)
Calcium: 8.4 mg/dL — ABNORMAL LOW (ref 8.9–10.3)
Chloride: 96 mmol/L — ABNORMAL LOW (ref 98–111)
Creatinine, Ser: 1.19 mg/dL — ABNORMAL HIGH (ref 0.44–1.00)
GFR, Estimated: 44 mL/min — ABNORMAL LOW (ref >60)
Glucose, Bld: 107 mg/dL — ABNORMAL HIGH (ref 70–99)
Potassium: 3.6 mmol/L (ref 3.5–5.1)
Sodium: 136 mmol/L (ref 135–145)

## 2022-12-02 MED ORDER — FUROSEMIDE 20 MG PO TABS
20.0000 mg | ORAL_TABLET | Freq: Every day | ORAL | 0 refills | Status: AC
Start: 2022-12-02 — End: ?

## 2022-12-02 NOTE — Progress Notes (Signed)
Explained discharge instructions to patient. Reviewed follow up appointment and next medication administration times. Also reviewed education. Patient verbalized having an understanding for instructions given. All belongings are in the patient's possession to include personal meds. IV and telemetry were removed by the patient's nurse. No other needs verbalized. Transported downstairs for discharge.

## 2022-12-02 NOTE — Care Management Important Message (Signed)
Important Message  Patient Details  Name: Lisa Mcclure MRN: [id number] Date of Birth: 12/15/1932   Medicare Important Message Given:  Yes     Renie Ora 12/02/2022, 9:00 AM

## 2022-12-02 NOTE — TOC Transition Note (Signed)
Transition of Care [facility]) - CM/SW Discharge Note   Patient Details  Name: Lisa Mcclure MRN: [id number] Date of Birth: 10-Sep-1932  [facility]) CM/SW Contact:  Tom-Johnson, Hershal Coria, RN Phone Number: 12/02/2022, 10:46 AM   Clinical Narrative:     Patient is scheduled for discharge today.  Readmission Risk Assessment done. Home Health info, Outpatient f/u, hospital f/u and discharge instructions on AVS. Grandson, Stone to transport at discharge.  No further TOC needs noted.       Final next level of care: Home w Home Health Services Barriers to Discharge: Barriers Resolved   Patient Goals and CMS Choice CMS Medicare.gov Compare Post Acute Care list provided to:: Patient Choice offered to / list presented to : NA  Discharge Placement                  Patient to be transferred to facility by: Grandson Name of family member notified: [facility]    Discharge Plan and Services Additional resources added to the After Visit Summary for     Discharge Planning Services: CM Consult            DME Arranged: N/A DME Agency: NA       HH Arranged: RN, PT, OT HH Agency: [facility] Date [facility] Agency Contacted: 12/02/22 Time HH Agency Contacted: 1003 Representative spoke with at [facility] Agency: Kandee Keen  Social Determinants of Health (SDOH) Interventions SDOH Screenings   Food Insecurity: No Food Insecurity (11/30/2022)  Housing: Low Risk  (11/30/2022)  Transportation Needs: No Transportation Needs (11/30/2022)  Utilities: Not At Risk (11/30/2022)  Alcohol Screen: Low Risk  (08/11/2021)  Depression (PHQ2-9): Medium Risk (08/11/2021)  Financial Resource Strain: Low Risk  (08/11/2021)  Physical Activity: Inactive (08/11/2021)  Social Connections: Moderately Isolated (08/11/2021)  Stress: No Stress Concern Present (08/11/2021)  Tobacco Use: Medium Risk (11/29/2022)     Readmission Risk Interventions    12/02/2022   10:45 AM 06/21/2022    4:57 PM  Readmission  Risk Prevention Plan  Transportation Screening Complete Complete  PCP or Specialist Appt within 3-5 Days Complete Complete  HRI or Home Care Consult Complete Complete  Social Work Consult for Recovery Care Planning/Counseling Complete   Palliative Care Screening Not Applicable Not Applicable  Medication Review Oceanographer) Referral to Pharmacy Complete

## 2022-12-02 NOTE — Discharge Summary (Signed)
Physician Discharge Summary   Patient: Lisa Mcclure MRN: [id number] DOB: Aug 13, 1932  Admit date:     11/29/2022  Discharge date: 12/02/22  Discharge Physician: Alberteen Sam   PCP: Garlan Fillers, MD     Recommendations at discharge:  Follow up with Dr. Eloise Harman in 1 week for CHF flare Dr. Eloise Harman: Discharged on lasix daily: Please check BMP and adjust diuretics as appropriate  Follow up with Cardiology Dr. Jacinto Halim in 2-4 weeks for CHF      Discharge Diagnoses: Principal Problem:   CHF exacerbation (HCC) Active Problems:   Acute hypoxemic respiratory failure (HCC)   Paroxysmal atrial fibrillation (HCC)   Chronic obstructive pulmonary disease (HCC)   Benign essential HTN   Esophageal reflux   Aortic stenosis   Resolved Problems:   * No resolved hospital problems. *   Hospital Course: 89 year old with HFpEF, moderate AS, PAF, COPD and OSA with known left atrial thrombus on Eliquis was admitted for bilateral leg swelling failed outpatient management, and progressive SOB, found to have decompensated heart failure.     Acute on chronic systolic and diastolic CHF Moderate AS EF previously 50-55%, most recently up to 60-65%.  Treated here with IV Lasix and leg swelling resolved, orthopnea resolved, symptoms returned to baseline.  Discharged with daily Lasix 20, spironolactone and Entresto, carvedilol and [place].  Has close PCP follow up.     Left atrial thrombus On Eliquis   PAF HTN Rate and BP are controlled on Coreg and amiodarone Eliquis for secondary prevention of stroke   Hypothyroidism Continue Synthroid   Anxiety and depression Continue BuSpar          The [facility] Controlled Substances Registry was reviewed for this patient prior to discharge.  Consultants: None Procedures performed: None  Disposition: Home Diet recommendation:  Discharge Diet Orders (From admission, onward)     Start     Ordered   12/02/22 0000   Diet - low sodium heart healthy        12/02/22 1024             DISCHARGE MEDICATION: Allergies as of 12/02/2022       Reactions   Statins Other (See Comments)   Muscle aches and INTERNAL BLEEDING   Atorvastatin Other (See Comments)   Myalgias   Gabapentin Other (See Comments)   "Made me loopy"   Methocarbamol Other (See Comments)   "Made me loopy"        Medication List     TAKE these medications    amiodarone 100 MG tablet Commonly known as: PACERONE TAKE ONE TABLET BY MOUTH EVERY OTHER DAY AS DIRECTED   apixaban 2.5 MG Tabs tablet Commonly known as: ELIQUIS Take 1 tablet (2.5 mg total) by mouth 2 (two) times daily.   budesonide 0.5 MG/2ML nebulizer solution Commonly known as: Pulmicort Take 2 mLs (0.5 mg total) by nebulization in the morning and at bedtime. What changed: when to take this   busPIRone 7.5 MG tablet Commonly known as: BUSPAR Take 7.5 mg by mouth See admin instructions. Take 7.5 mg by mouth at bedtime and an additional 7.5 mg once a day as needed for anxiety   carvedilol 3.125 MG tablet Commonly known as: COREG Take 1 tablet (3.125 mg total) by mouth 2 (two) times daily with a meal. What changed:  when to take this additional instructions   dapagliflozin propanediol 10 MG Tabs tablet Commonly known as: Farxiga Take 1 tablet (10 mg total) by mouth  daily before breakfast.   Aleve Arthritis Pain 1 % Gel Generic drug: diclofenac Sodium Apply 2 g topically 4 (four) times daily as needed (for pain).   diclofenac sodium 1 % Gel Commonly known as: VOLTAREN Apply 2.25 g topically 3 (three) times daily as needed (for pain).   Entresto 24-26 MG Generic drug: sacubitril-valsartan Take 1 tablet by mouth 2 (two) times daily.   furosemide 20 MG tablet Commonly known as: LASIX Take 1 tablet (20 mg total) by mouth daily. What changed: See the new instructions.   HYDROcodone-acetaminophen 7.5-325 MG tablet Commonly known as: NORCO Take 1  tablet by mouth every 6 (six) hours as needed for moderate pain. What changed: when to take this   ipratropium 0.03 % nasal spray Commonly known as: ATROVENT Place 2 sprays into both nostrils every 12 (twelve) hours. What changed:  when to take this reasons to take this   latanoprost 0.005 % ophthalmic solution Commonly known as: XALATAN Place 1 drop into both eyes at bedtime.   levothyroxine 125 MCG tablet Commonly known as: SYNTHROID Take 125 mcg by mouth daily before breakfast.   melatonin 3 MG Tabs tablet Take 3 mg by mouth at bedtime.   modafinil 100 MG tablet Commonly known as: PROVIGIL Take 100 mg by mouth every morning.   nortriptyline 10 MG capsule Commonly known as: PAMELOR Take 10 mg by mouth daily.   PRESERVISION AREDS 2 PO Take by mouth.   rOPINIRole 1 MG tablet Commonly known as: REQUIP Take 1 mg by mouth See admin instructions. Take 1 mg by mouth at 1 PM and 2 mg at 9 PM   spironolactone 25 MG tablet Commonly known as: ALDACTONE Take 25 mg by mouth daily.   Systane Complete PF 0.6 % Soln Generic drug: Propylene Glycol (PF) Place 1 drop into both eyes 4 (four) times daily as needed (for dryness).   Vitamin D3 125 MCG (5000 UT) Caps Take 1 capsule by mouth daily.        Follow-up Information     [facility] Follow up.   Specialty: Home Health Services Why: they will call you to set up your first home visit Contact information: [address] [place] [address] [phone]         Garlan Fillers, MD. Schedule an appointment as soon as possible for a visit in 1 week(s).   Specialty: Internal Medicine Contact information: [address] [place] [address] [phone]         Yates Decamp, MD Follow up.   Specialty: Cardiology Contact information: [address] [place] [address] [phone]                 Discharge Instructions     Diet - low sodium heart healthy    Complete by: As directed    Discharge instructions   Complete by: As directed    **IMPORTANT DISCHARGE INSTRUCTIONS**   From Dr. Maryfrances Bunnell: You were admitted for a congestive heart failure flare You were treated here with diuretics (intravenous Lasix, high dose) and this helped  For now, take Lasix 20 mg once daily every day Continue your other diuretics Farxiga and spironolactone once daily Continue carvedilol and Entresto, take each twice daily (once in morning and once at night) And continue your blood thinner and heart control medicine amiodarone as before  Call Dr. Verl Dicker office for a follow up appointment  See Dr. Eloise Harman in 1 week and get labs  drawn   Increase activity slowly   Complete by: As directed        Discharge Exam: Filed Weights   11/30/22 0250 12/01/22 0520 12/02/22 0508  Weight: 91.9 kg 89.9 kg 89.8 kg    General: Pt is alert, awake, not in acute distress Cardiovascular: RRR, nl S1-S2, no murmurs appreciated.   No LE edema but chronic brawny change.   Respiratory: Normal respiratory rate and rhythm.  CTAB without rales or wheezes. Abdominal: Abdomen soft and non-tender.  No distension or HSM.   Neuro/Psych: Strength symmetric in upper and lower extremities.  Judgment and insight appear normal.   Condition at discharge: fair  The results of significant diagnostics from this hospitalization (including imaging, microbiology, ancillary and laboratory) are listed below for reference.   Imaging Studies: DG Chest Port 1 View  Result Date: 11/29/2022 CLINICAL DATA:  Shortness of breath EXAM: PORTABLE CHEST 1 VIEW COMPARISON:  Chest x-ray 06/19/2022 CT of the chest 06/18/2021 FINDINGS:.: FINDINGS:. The heart is enlarged. There central interstitial prominence. There is no lung consolidation, pleural effusion or pneumothorax. Degenerative changes affect both shoulders. IMPRESSION: Cardiomegaly with central interstitial prominence, likely due to pulmonary edema.  Electronically Signed   By: Darliss Cheney M.D.   On: 11/29/2022 21:15   IR Radiologist Eval & Mgmt  Result Date: 11/24/2022 EXAM: ESTABLISHED PATIENT OFFICE VISIT CHIEF COMPLAINT: SEE EPIC NOTE HISTORY OF PRESENT ILLNESS: SEE EPIC NOTE REVIEW OF SYSTEMS: SEE EPIC NOTE PHYSICAL EXAMINATION: SEE EPIC NOTE ASSESSMENT AND PLAN: SEE EPIC NOTE Electronically Signed   By: Malachy Moan M.D.   On: 11/24/2022 09:13    Microbiology: Results for orders placed or performed during the hospital encounter of 11/29/22  Resp panel by RT-PCR (RSV, Flu A&B, Covid) Anterior Nasal Swab     Status: None   Collection Time: 11/29/22  8:59 PM   Specimen: Anterior Nasal Swab  Result Value Ref Range Status   SARS Coronavirus 2 by RT PCR NEGATIVE NEGATIVE Final    Comment: (NOTE) SARS-CoV-2 target nucleic acids are NOT DETECTED.  The SARS-CoV-2 RNA is generally detectable in upper respiratory specimens during the acute phase of infection. The lowest concentration of SARS-CoV-2 viral copies this assay can detect is 138 copies/mL. A negative result does not preclude SARS-Cov-2 infection and should not be used as the sole basis for treatment or other patient management decisions. A negative result may occur with  improper specimen collection/handling, submission of specimen other than nasopharyngeal swab, presence of viral mutation(s) within the areas targeted by this assay, and inadequate number of viral copies(<138 copies/mL). A negative result must be combined with clinical observations, patient history, and epidemiological information. The expected result is Negative.  Fact Sheet for Patients:  BloggerCourse.com  Fact Sheet for Healthcare Providers:  SeriousBroker.it  This test is no t yet approved or cleared by the [place] FDA and  has been authorized for detection and/or diagnosis of SARS-CoV-2 by FDA under an Emergency Use Authorization (EUA).  This EUA will remain  in effect (meaning this test can be used) for the duration of the COVID-19 declaration under Section 564(b)(1) of the Act, 21 U.S.C.section 360bbb-3(b)(1), unless the authorization is terminated  or revoked sooner.       Influenza A by PCR NEGATIVE NEGATIVE Final   Influenza B by PCR NEGATIVE NEGATIVE Final    Comment: (NOTE) The Xpert Xpress SARS-CoV-2/FLU/RSV plus assay is intended as an aid in the diagnosis of influenza from Nasopharyngeal swab specimens and should not  be used as a sole basis for treatment. Nasal washings and aspirates are unacceptable for Xpert Xpress SARS-CoV-2/FLU/RSV testing.  Fact Sheet for Patients: BloggerCourse.com  Fact Sheet for Healthcare Providers: SeriousBroker.it  This test is not yet approved or cleared by the [place] FDA and has been authorized for detection and/or diagnosis of SARS-CoV-2 by FDA under an Emergency Use Authorization (EUA). This EUA will remain in effect (meaning this test can be used) for the duration of the COVID-19 declaration under Section 564(b)(1) of the Act, 21 U.S.C. section 360bbb-3(b)(1), unless the authorization is terminated or revoked.     Resp Syncytial Virus by PCR NEGATIVE NEGATIVE Final    Comment: (NOTE) Fact Sheet for Patients: BloggerCourse.com  Fact Sheet for Healthcare Providers: SeriousBroker.it  This test is not yet approved or cleared by the [place] FDA and has been authorized for detection and/or diagnosis of SARS-CoV-2 by FDA under an Emergency Use Authorization (EUA). This EUA will remain in effect (meaning this test can be used) for the duration of the COVID-19 declaration under Section 564(b)(1) of the Act, 21 U.S.C. section 360bbb-3(b)(1), unless the authorization is terminated or revoked.  Performed at [facility], [address] [place], [place] [address]     Labs: CBC: Recent Labs  Lab 11/29/22 1733  WBC 7.5  HGB 13.0  HCT 40.4  MCV 96.0  PLT 255   Basic Metabolic Panel: Recent Labs  Lab 11/29/22 1733 11/30/22 0727 12/01/22 0027 12/02/22 0408  NA 138 134* 136 136  K 4.2 3.6 3.8 3.6  CL 100 98 96* 96*  CO2 28 25 26 29   GLUCOSE 96 88 92 107*  BUN 24* 24* 33* 33*  CREATININE 0.85 0.95 1.08* 1.19*  CALCIUM 8.7* 8.3* 8.2* 8.4*   Liver Function Tests: Recent Labs  Lab 11/29/22 1733  AST 18  ALT 9  ALKPHOS 86  BILITOT 0.4  PROT 7.3  ALBUMIN 4.0   CBG: No results for input(s): "GLUCAP" in the last 168 hours.  Discharge time spent: approximately 35 minutes spent on discharge counseling, evaluation of patient on day of discharge, and coordination of discharge planning with nursing, social work, pharmacy and case management  Signed: Alberteen Sam, MD [facility] 12/02/2022

## 2022-12-06 DIAGNOSIS — I3139 Other pericardial effusion (noninflammatory): Secondary | ICD-10-CM | POA: Diagnosis not present

## 2022-12-06 DIAGNOSIS — J449 Chronic obstructive pulmonary disease, unspecified: Secondary | ICD-10-CM | POA: Diagnosis not present

## 2022-12-06 DIAGNOSIS — G4700 Insomnia, unspecified: Secondary | ICD-10-CM | POA: Diagnosis not present

## 2022-12-06 DIAGNOSIS — I480 Paroxysmal atrial fibrillation: Secondary | ICD-10-CM | POA: Diagnosis not present

## 2022-12-06 DIAGNOSIS — M069 Rheumatoid arthritis, unspecified: Secondary | ICD-10-CM | POA: Diagnosis not present

## 2022-12-06 DIAGNOSIS — F32A Depression, unspecified: Secondary | ICD-10-CM | POA: Diagnosis not present

## 2022-12-06 DIAGNOSIS — E039 Hypothyroidism, unspecified: Secondary | ICD-10-CM | POA: Diagnosis not present

## 2022-12-06 DIAGNOSIS — M545 Low back pain, unspecified: Secondary | ICD-10-CM | POA: Diagnosis not present

## 2022-12-06 DIAGNOSIS — K5790 Diverticulosis of intestine, part unspecified, without perforation or abscess without bleeding: Secondary | ICD-10-CM | POA: Diagnosis not present

## 2022-12-06 DIAGNOSIS — J9601 Acute respiratory failure with hypoxia: Secondary | ICD-10-CM | POA: Diagnosis not present

## 2022-12-06 DIAGNOSIS — N1832 Chronic kidney disease, stage 3b: Secondary | ICD-10-CM | POA: Diagnosis not present

## 2022-12-06 DIAGNOSIS — E669 Obesity, unspecified: Secondary | ICD-10-CM | POA: Diagnosis not present

## 2022-12-06 DIAGNOSIS — I5043 Acute on chronic combined systolic (congestive) and diastolic (congestive) heart failure: Secondary | ICD-10-CM | POA: Diagnosis not present

## 2022-12-06 DIAGNOSIS — F419 Anxiety disorder, unspecified: Secondary | ICD-10-CM | POA: Diagnosis not present

## 2022-12-06 DIAGNOSIS — G2581 Restless legs syndrome: Secondary | ICD-10-CM | POA: Diagnosis not present

## 2022-12-06 DIAGNOSIS — M19012 Primary osteoarthritis, left shoulder: Secondary | ICD-10-CM | POA: Diagnosis not present

## 2022-12-06 DIAGNOSIS — G4733 Obstructive sleep apnea (adult) (pediatric): Secondary | ICD-10-CM | POA: Diagnosis not present

## 2022-12-06 DIAGNOSIS — N3281 Overactive bladder: Secondary | ICD-10-CM | POA: Diagnosis not present

## 2022-12-06 DIAGNOSIS — I513 Intracardiac thrombosis, not elsewhere classified: Secondary | ICD-10-CM | POA: Diagnosis not present

## 2022-12-06 DIAGNOSIS — I130 Hypertensive heart and chronic kidney disease with heart failure and stage 1 through stage 4 chronic kidney disease, or unspecified chronic kidney disease: Secondary | ICD-10-CM | POA: Diagnosis not present

## 2022-12-06 DIAGNOSIS — K219 Gastro-esophageal reflux disease without esophagitis: Secondary | ICD-10-CM | POA: Diagnosis not present

## 2022-12-06 DIAGNOSIS — M19011 Primary osteoarthritis, right shoulder: Secondary | ICD-10-CM | POA: Diagnosis not present

## 2022-12-06 DIAGNOSIS — I350 Nonrheumatic aortic (valve) stenosis: Secondary | ICD-10-CM | POA: Diagnosis not present

## 2022-12-06 DIAGNOSIS — I872 Venous insufficiency (chronic) (peripheral): Secondary | ICD-10-CM | POA: Diagnosis not present

## 2022-12-06 DIAGNOSIS — D631 Anemia in chronic kidney disease: Secondary | ICD-10-CM | POA: Diagnosis not present

## 2022-12-08 ENCOUNTER — Ambulatory Visit: Payer: Medicare Other | Admitting: Cardiology

## 2022-12-08 DIAGNOSIS — H401113 Primary open-angle glaucoma, right eye, severe stage: Secondary | ICD-10-CM | POA: Diagnosis not present

## 2022-12-08 DIAGNOSIS — H401121 Primary open-angle glaucoma, left eye, mild stage: Secondary | ICD-10-CM | POA: Diagnosis not present

## 2022-12-13 DIAGNOSIS — D631 Anemia in chronic kidney disease: Secondary | ICD-10-CM | POA: Diagnosis not present

## 2022-12-13 DIAGNOSIS — I5043 Acute on chronic combined systolic (congestive) and diastolic (congestive) heart failure: Secondary | ICD-10-CM | POA: Diagnosis not present

## 2022-12-13 DIAGNOSIS — N1832 Chronic kidney disease, stage 3b: Secondary | ICD-10-CM | POA: Diagnosis not present

## 2022-12-13 DIAGNOSIS — I480 Paroxysmal atrial fibrillation: Secondary | ICD-10-CM | POA: Diagnosis not present

## 2022-12-13 DIAGNOSIS — J9601 Acute respiratory failure with hypoxia: Secondary | ICD-10-CM | POA: Diagnosis not present

## 2022-12-13 DIAGNOSIS — I130 Hypertensive heart and chronic kidney disease with heart failure and stage 1 through stage 4 chronic kidney disease, or unspecified chronic kidney disease: Secondary | ICD-10-CM | POA: Diagnosis not present

## 2022-12-14 ENCOUNTER — Telehealth: Payer: Self-pay

## 2022-12-14 DIAGNOSIS — I480 Paroxysmal atrial fibrillation: Secondary | ICD-10-CM | POA: Diagnosis not present

## 2022-12-14 DIAGNOSIS — I130 Hypertensive heart and chronic kidney disease with heart failure and stage 1 through stage 4 chronic kidney disease, or unspecified chronic kidney disease: Secondary | ICD-10-CM | POA: Diagnosis not present

## 2022-12-14 DIAGNOSIS — J9601 Acute respiratory failure with hypoxia: Secondary | ICD-10-CM | POA: Diagnosis not present

## 2022-12-14 DIAGNOSIS — N1832 Chronic kidney disease, stage 3b: Secondary | ICD-10-CM | POA: Diagnosis not present

## 2022-12-14 DIAGNOSIS — I5043 Acute on chronic combined systolic (congestive) and diastolic (congestive) heart failure: Secondary | ICD-10-CM | POA: Diagnosis not present

## 2022-12-14 DIAGNOSIS — D631 Anemia in chronic kidney disease: Secondary | ICD-10-CM | POA: Diagnosis not present

## 2022-12-14 NOTE — Telephone Encounter (Signed)
Stop Amiodarone and Coreg

## 2022-12-14 NOTE — Telephone Encounter (Signed)
Called ad spoke to patient's daughter

## 2022-12-16 ENCOUNTER — Other Ambulatory Visit: Payer: Self-pay

## 2022-12-16 ENCOUNTER — Ambulatory Visit (INDEPENDENT_AMBULATORY_CARE_PROVIDER_SITE_OTHER): Payer: Medicare Other | Admitting: Family Medicine

## 2022-12-16 ENCOUNTER — Encounter: Payer: Self-pay | Admitting: Family Medicine

## 2022-12-16 VITALS — HR 77 | Ht 63.0 in | Wt 202.0 lb

## 2022-12-16 DIAGNOSIS — G8929 Other chronic pain: Secondary | ICD-10-CM | POA: Diagnosis not present

## 2022-12-16 DIAGNOSIS — M25512 Pain in left shoulder: Secondary | ICD-10-CM | POA: Diagnosis not present

## 2022-12-16 DIAGNOSIS — M25511 Pain in right shoulder: Secondary | ICD-10-CM

## 2022-12-16 NOTE — Progress Notes (Signed)
I, Stevenson Clinch, CMA acting as a scribe for Clementeen Graham, MD.  Lisa Mcclure is a 89 y.o. female who presents to [facility] today for exacerbation of her bilat shoulder pain. Pt was last seen by Dr. Denyse Amass on 06/14/22 and was given a L GH steroid injection.  Today, pt reports worsening bilat shoulder pain. Now having pain even while at rest. Limited ROM. Weakness with pushing up. Feels swollen at anterior aspect of the shoulder. Notes tightness and stiffness in the neck. Denies d/t. Unable to take NSAID's. Has tried BioFreeze and OTC NSAID cream, Hydrocodone, massager with heat, ice.   Dx imaging: 06/14/22 R & L shoulder XR 07/10/19 R & L shoulder XR   Pertinent review of systems: No fevers or chills  Relevant historical information: Heart failure.  Aortic aneurysm.  BOOP, severe shoulder arthritis bilaterally.   Exam:  Pulse 77   Ht 5\' 3"  (1.6 m)   Wt 202 lb (91.6 kg)   SpO2 98%   BMI 35.78 kg/m  General: Well Developed, well nourished, and in no acute distress.   MSK: Very limited shoulder motion bilaterally.  Shoulders are nontender.    Lab and Radiology Results  Procedure: Real-time Ultrasound Guided Injection of right glenohumeral joint posterior approach Device: Philips Affiniti 50G/GE Logiq Images permanently stored and available for review in PACS Verbal informed consent obtained.  Discussed risks and benefits of procedure. Warned about infection, bleeding, hyperglycemia damage to structures among others. Patient expresses understanding and agreement Time-out conducted.   Noted no overlying erythema, induration, or other signs of local infection.   Skin prepped in a sterile fashion.   Local anesthesia: Topical Ethyl chloride.   With sterile technique and under real time ultrasound guidance: 40 mg of Kenalog and 2 mL of Marcaine injected into glenohumeral joint. Fluid seen entering the joint capsule.   Completed without difficulty    Pain immediately resolved suggesting accurate placement of the medication.   Advised to call if fevers/chills, erythema, induration, drainage, or persistent bleeding.   Images permanently stored and available for review in the ultrasound unit.  Impression: Technically successful ultrasound guided injection.    Procedure: Real-time Ultrasound Guided Injection of left shoulder glenohumeral joint posterior approach Device: Philips Affiniti 50G/GE Logiq Images permanently stored and available for review in PACS Verbal informed consent obtained.  Discussed risks and benefits of procedure. Warned about infection, bleeding, hyperglycemia damage to structures among others. Patient expresses understanding and agreement Time-out conducted.   Noted no overlying erythema, induration, or other signs of local infection.   Skin prepped in a sterile fashion.   Local anesthesia: Topical Ethyl chloride.   With sterile technique and under real time ultrasound guidance: 40 mg of Kenalog and 2 mL of Marcaine injected into glenohumeral joint. Fluid seen entering the joint capsule.   Completed without difficulty   Pain immediately resolved suggesting accurate placement of the medication.   Advised to call if fevers/chills, erythema, induration, drainage, or persistent bleeding.   Images permanently stored and available for review in the ultrasound unit.  Impression: Technically successful ultrasound guided injection.         Assessment and Plan: 89 y.o. female with bilateral shoulder pain due to severe end-stage DJD.  Plan for steroid injection today.  Check back as needed.  Can repeat this injection every 3 months.  Unfortunately she is not a good candidate for shoulder replacement.   PDMP not reviewed this encounter. Orders Placed This Encounter  Procedures   [place] LIMITED JOINT SPACE STRUCTURES UP BILAT(NO LINKED CHARGES)    Order Specific Question:   Reason for Exam (SYMPTOM  OR DIAGNOSIS REQUIRED)     Answer:   bilat shoulder pain    Order Specific Question:   Preferred imaging location?    Answer:    [facility]   No orders of the defined types were placed in this encounter.    Discussed warning signs or symptoms. Please see discharge instructions. Patient expresses understanding.   The above documentation has been reviewed and is accurate and complete Clementeen Graham, M.D.

## 2022-12-16 NOTE — Patient Instructions (Addendum)
Thank you for coming in today.   You received an injection today. Seek immediate medical attention if the joint becomes red, extremely painful, or is oozing fluid.  I can repeat this shot in 3 months if needed  Check back as needed

## 2022-12-19 DIAGNOSIS — H353114 Nonexudative age-related macular degeneration, right eye, advanced atrophic with subfoveal involvement: Secondary | ICD-10-CM | POA: Diagnosis not present

## 2022-12-19 DIAGNOSIS — H353123 Nonexudative age-related macular degeneration, left eye, advanced atrophic without subfoveal involvement: Secondary | ICD-10-CM | POA: Diagnosis not present

## 2022-12-19 DIAGNOSIS — H43393 Other vitreous opacities, bilateral: Secondary | ICD-10-CM | POA: Diagnosis not present

## 2022-12-19 DIAGNOSIS — H353221 Exudative age-related macular degeneration, left eye, with active choroidal neovascularization: Secondary | ICD-10-CM | POA: Diagnosis not present

## 2022-12-19 DIAGNOSIS — H43813 Vitreous degeneration, bilateral: Secondary | ICD-10-CM | POA: Diagnosis not present

## 2022-12-19 DIAGNOSIS — H35033 Hypertensive retinopathy, bilateral: Secondary | ICD-10-CM | POA: Diagnosis not present

## 2022-12-20 ENCOUNTER — Other Ambulatory Visit: Payer: Self-pay

## 2022-12-20 DIAGNOSIS — J9601 Acute respiratory failure with hypoxia: Secondary | ICD-10-CM | POA: Diagnosis not present

## 2022-12-20 DIAGNOSIS — N1832 Chronic kidney disease, stage 3b: Secondary | ICD-10-CM | POA: Diagnosis not present

## 2022-12-20 DIAGNOSIS — D631 Anemia in chronic kidney disease: Secondary | ICD-10-CM | POA: Diagnosis not present

## 2022-12-20 DIAGNOSIS — I10 Essential (primary) hypertension: Secondary | ICD-10-CM

## 2022-12-20 DIAGNOSIS — I5043 Acute on chronic combined systolic (congestive) and diastolic (congestive) heart failure: Secondary | ICD-10-CM | POA: Diagnosis not present

## 2022-12-20 DIAGNOSIS — I5032 Chronic diastolic (congestive) heart failure: Secondary | ICD-10-CM

## 2022-12-20 DIAGNOSIS — I480 Paroxysmal atrial fibrillation: Secondary | ICD-10-CM | POA: Diagnosis not present

## 2022-12-20 DIAGNOSIS — I130 Hypertensive heart and chronic kidney disease with heart failure and stage 1 through stage 4 chronic kidney disease, or unspecified chronic kidney disease: Secondary | ICD-10-CM | POA: Diagnosis not present

## 2022-12-20 NOTE — Telephone Encounter (Signed)
Lisa Mcclure states yes nothing changed daughter does still take her to get all types of different food. But Lisa Mcclure states patient has been having low appetite and not feeling well.

## 2022-12-20 NOTE — Patient Outreach (Signed)
  Care Coordination   Documentation  Note   12/20/2022 Name: Lisa Mcclure MRN: [id number] DOB: 21-May-1932  Lisa Mcclure is a 89 y.o. year old female who sees Garlan Fillers, MD for primary care. I collaborated with Publishing rights manager to follow up on the pending referral for [facility] that was placed via [id number].  What matters to the patients health and wellness today?  SW did not speak with the patient today.    SDOH assessments and interventions completed:  No     Care Coordination Interventions:  Yes, provided   Interventions Today    Flowsheet Row Most Recent Value  Chronic Disease   Chronic disease during today's visit Hypertension (HTN), Chronic Obstructive Pulmonary Disease (COPD)  General Interventions   General Interventions Discussed/Reviewed Teacher, English as a foreign language with [employer] Interim COO, Charlane Ferretti, who accepted patients referral to [facility].]        Follow up plan: No further intervention required.   Encounter Outcome:  Patient Visit Completed   Bevelyn Ngo, Kenard Gower, CDP Social Worker, Certified Dementia Practitioner [facility] Care Management  Care Coordination [phone]

## 2022-12-20 NOTE — Telephone Encounter (Signed)
Please ask if her daughter has been bringing in food!!. If she is not strict with her diet, she will precipitate heart fialure

## 2022-12-20 NOTE — Telephone Encounter (Signed)
Beth with [facility] called stating this morning the patient heart rate was "regular irregular". She states at the beginning of the visit heart rate was 40-80 and at the end of the visit it was 68-80. Patient was c/o not feeling good. Beth also stated patients weight went up 3-4 pounds since last time being seen. Patient did take a cortizone shot Friday the 6. Also her bp was a little elevated but patient was in pain.

## 2022-12-21 NOTE — Telephone Encounter (Signed)
Please update

## 2022-12-22 ENCOUNTER — Encounter: Payer: Self-pay | Admitting: Cardiology

## 2022-12-22 ENCOUNTER — Ambulatory Visit: Payer: Medicare Other | Admitting: Cardiology

## 2022-12-22 VITALS — BP 140/65 | HR 74 | Resp 16 | Ht 63.0 in | Wt 201.0 lb

## 2022-12-22 DIAGNOSIS — I5032 Chronic diastolic (congestive) heart failure: Secondary | ICD-10-CM

## 2022-12-22 DIAGNOSIS — I5043 Acute on chronic combined systolic (congestive) and diastolic (congestive) heart failure: Secondary | ICD-10-CM | POA: Diagnosis not present

## 2022-12-22 DIAGNOSIS — I10 Essential (primary) hypertension: Secondary | ICD-10-CM

## 2022-12-22 DIAGNOSIS — I5033 Acute on chronic diastolic (congestive) heart failure: Secondary | ICD-10-CM

## 2022-12-22 DIAGNOSIS — I495 Sick sinus syndrome: Secondary | ICD-10-CM | POA: Diagnosis not present

## 2022-12-22 DIAGNOSIS — I130 Hypertensive heart and chronic kidney disease with heart failure and stage 1 through stage 4 chronic kidney disease, or unspecified chronic kidney disease: Secondary | ICD-10-CM | POA: Diagnosis not present

## 2022-12-22 DIAGNOSIS — I480 Paroxysmal atrial fibrillation: Secondary | ICD-10-CM | POA: Diagnosis not present

## 2022-12-22 DIAGNOSIS — D631 Anemia in chronic kidney disease: Secondary | ICD-10-CM | POA: Diagnosis not present

## 2022-12-22 DIAGNOSIS — I350 Nonrheumatic aortic (valve) stenosis: Secondary | ICD-10-CM | POA: Diagnosis not present

## 2022-12-22 DIAGNOSIS — J9601 Acute respiratory failure with hypoxia: Secondary | ICD-10-CM | POA: Diagnosis not present

## 2022-12-22 DIAGNOSIS — N1832 Chronic kidney disease, stage 3b: Secondary | ICD-10-CM | POA: Diagnosis not present

## 2022-12-22 NOTE — Progress Notes (Signed)
Primary Physician/Referring:  Garlan Fillers, MD  [name], female    DOB: 01-08-1933, 89 y.o.   MRN: [id number]  Chief Complaint  Patient presents with   Acute diastolic heart failure   Bradycardia   Follow-up    6 weeks   HPI:    Lisa Mcclure  is a 89 y.o. f.female  with  obesity, chronic diastolic CHF, hypertensive heart disease and chronic 3A-B kidney disease, paroxysmal atrial fibrillation,  anemia secondary to hemorrhoids and diverticulosis, esophageal reflux, depression, OSA intolerant to CPAP on nocturnal 02, rheumatoid arthritis, and venous insufficiency, aortic and coronary calcification, s/p AAA repair on 12/20/2019. Patient underwent pseudoaneurysm sac repair of the AAA on 10/24/2022 and repair of the endoleak. Dry weight 195 Lbs   Her last hospitalization was on 11/29/2022 for 3 days with acute decompensated diastolic heart failure and she had maintained sinus rhythm during the hospitalization.  Has home health and now presents for follow-up.  Her main complaint today is feeling faint, markedly fatigued and tired and feeling of unease especially when she notices her heart rate suddenly dropped down to 30 to 40 bpm.  No chest pain.  States that her dyspnea since hospitalization has remained stable.  No further PND or leg edema.  She has been careful with her diet.  She has home physical therapy and home health.   Past Medical History:  Diagnosis Date   Acute renal failure superimposed on stage 3b chronic kidney disease (HCC) 06/15/2021   Anemia    h/o hemorrhoidal bleeding and blood transfusion   Chronic diastolic CHF (congestive heart failure) (HCC)    Depression    Diverticulosis    DOE (dyspnea on exertion)    Esophageal reflux    GERD (gastroesophageal reflux disease)    Hypothyroidism    Insomnia    LBP (low back pain)    OAB (overactive bladder)    Obesity    OSA (obstructive sleep apnea)    Osteoarthritis    Paroxysmal atrial  fibrillation (HCC)    Rheumatoid arthritis(714.0)    Shoulder pain, bilateral    Unspecified essential hypertension    Venous insufficiency    Social History   Tobacco Use   Smoking status: Former    Current packs/day: 0.00    Average packs/day: 0.2 packs/day for 10.0 years (2.0 ttl pk-yrs)    Types: Cigarettes    Start date: 04/11/1974    Quit date: 04/11/1984    Years since quitting: 38.7    Passive exposure: Past   Smokeless tobacco: Never  Substance Use Topics   Alcohol use: Yes    Alcohol/week: 0.0 standard drinks of alcohol    Comment: cocktail once a week   Marital Status: Widowed  ROS  Review of Systems  Constitutional: Positive for malaise/fatigue.  Cardiovascular:  Positive for dyspnea on exertion and near-syncope. Negative for chest pain and leg swelling.  Neurological:  Positive for dizziness.   Objective  Blood pressure (!) 140/65, pulse 74, resp. rate 16, height 5\' 3"  (1.6 m), weight 201 lb (91.2 kg), SpO2 96%.     12/22/2022    4:09 PM 12/16/2022   11:16 AM 12/02/2022    5:08 AM  Vitals with BMI  Height 5\' 3"  5\' 3"    Weight 201 lbs 202 lbs 197 lbs 14 oz  BMI 35.61 35.79 35.07  Systolic 140  128  Diastolic 65  49  Pulse 74 77 64     Physical Exam Vitals  reviewed.  Constitutional:      Appearance: She is well-developed. She is obese.  Neck:     Vascular: Carotid bruit present. No JVD.  Cardiovascular:     Rate and Rhythm: Normal rate and regular rhythm.     Pulses: Intact distal pulses.     Heart sounds: S1 normal and S2 normal. Murmur heard.     Harsh midsystolic murmur is present with a grade of 3/6 at the upper right sternal border radiating to the apex.     No gallop.  Pulmonary:     Effort: Pulmonary effort is normal. No accessory muscle usage.     Breath sounds: Rales (bases) present.  Abdominal:     General: Bowel sounds are normal.     Palpations: Abdomen is soft.  Musculoskeletal:     Right lower leg: Edema (trace) present.     Left lower  leg: Edema (trace) present.    Laboratory examination:   Recent Labs    11/30/22 0727 12/01/22 0027 12/02/22 0408  NA 134* 136 136  K 3.6 3.8 3.6  CL 98 96* 96*  CO2 25 26 29   GLUCOSE 88 92 107*  BUN 24* 33* 33*  CREATININE 0.95 1.08* 1.19*  CALCIUM 8.3* 8.2* 8.4*  GFRNONAA 58* 50* 44*      Latest Ref Rng & Units 12/02/2022    4:08 AM 12/01/2022   12:27 AM 11/30/2022    7:27 AM  CMP  Glucose 70 - 99 mg/dL 130  92  88   BUN 8 - 23 mg/dL 33  33  24   Creatinine 0.44 - 1.00 mg/dL 8.65  7.84  6.96   Sodium 135 - 145 mmol/L 136  136  134   Potassium 3.5 - 5.1 mmol/L 3.6  3.8  3.6   Chloride 98 - 111 mmol/L 96  96  98   CO2 22 - 32 mmol/L 29  26  25    Calcium 8.9 - 10.3 mg/dL 8.4  8.2  8.3       Latest Ref Rng & Units 11/29/2022    5:33 PM 10/24/2022    7:06 AM 06/20/2022    2:54 AM  CBC  WBC 4.0 - 10.5 K/uL 7.5  7.3  9.8   Hemoglobin 12.0 - 15.0 g/dL 29.5  28.4  13.2   Hematocrit 36.0 - 46.0 % 40.4  41.3  43.4   Platelets 150 - 400 K/uL 255  247  236    Lipid Panel    Component Value Date/Time   CHOL 126 03/14/2019 1121   TRIG 82 03/14/2019 1121   HDL 49 03/14/2019 1121   CHOLHDL 5.1 CALC 12/19/2006 0916   VLDL 26 12/19/2006 0916   LDLCALC 61 03/14/2019 1121   LDLDIRECT 159.2 12/19/2006 0916    Lab Results  Component Value Date   TSH 1.195 06/19/2022    BNP (last 3 results) Recent Labs    04/24/22 0050 06/19/22 1720 11/29/22 1733  BNP 855.1* 1,428.4* 307.9*   ProBNP (last 3 results) Recent Labs    06/28/22 1313  PROBNP 583   Radiology:   CT Abdomen and Pelvis W Contrast 12/20/2019: 1. Large Infrarenal Abdominal Aortic Aneurysm measuring up to 5.7 cm with mild perianeurysmal inflammation. Differential considerations include impending AAA rupture, mycotic AAA, symptomatic AAA. Recommend urgent transfer to emergency department and Vascular Surgery consultation. Aortic aneurysm NOS (ICD10-I71.9). Aortic Atherosclerosis (ICD10-I70.0). 2. No other  acute or inflammatory process identified. 5 cm fat containing left pelvic hernia.  Large bowel diverticulosis. 3. Simple appearing 4 cm left ovarian cyst, recommend follow-up Ultrasound in 6-12 months  Cardiac Studies:   Lexiscan myoview stress test 12/08/2017: 1. Lexiscan stress test was performed. Exercise capacity was not assessed. Stress symptoms included abdominal pain. Blood pressure was normal. The resting and stress electrocardiogram demonstrated normal sinus rhythm, normal resting conduction, no resting arrhythmias and normal rest repolarization. 2. The overall quality of the study is good. There is no evidence of abnormal lung activity. Stress and rest SPECT images demonstrate homogeneous tracer distribution throughout the myocardium. Gated SPECT imaging reveals normal myocardial thickening and wall motion. The left ventricular ejection fraction was normal (54%). 3. Low risk study  Renal artery duplex  06/21/2022: Right: 1-59% stenosis of the right renal artery. Abnormal right  Resistive Index. Normal size right kidney.  Left:  1-59% stenosis of the left renal artery. Abnormal left Resisitve Index. Normal size of left kidney.  Right kidney length (cm)10.12, left kidney length (cm)10.07   TEE 06/23/2022:  1. Left ventricular ejection fraction, by estimation, is 60 to 65%. The left ventricle has normal function. The left ventricle has no regional wall motion abnormalities.  2. Right ventricular systolic function is normal. The right ventricular size is normal.  3. Left atrial size was mild to moderately dilated. No left atrial/left atrial appendage thrombus was detected.  4. The mitral valve was not assessed. No evidence of mitral valve regurgitation.  5. The aortic valve is calcified. Aortic valve regurgitation is trivial. Moderate aortic valve stenosis.  Peak velocity 3 m/s. Surface probe used: Vmax 3.03 m/s. Vmean 2.21 m/s. Max peak gradient 37 mmHg. Mean gradient 22 mmHg. VTI  71.7  EKG   EKG 12/22/2022: Normal sinus rhythm at the rate of 71 bpm, left atrial enlargement, poor R wave progression, probably normal variant.  Otherwise normal EKG.  Compared to 09/08/2022, first-degree AV block not present.  Allergies & Medications   Allergies  Allergen Reactions   Statins Other (See Comments)    Muscle aches and INTERNAL BLEEDING   Atorvastatin Other (See Comments)    Myalgias   Gabapentin Other (See Comments)    "Made me loopy"   Methocarbamol Other (See Comments)    "Made me loopy"    Current Outpatient Medications:    ALEVE ARTHRITIS PAIN 1 % GEL, Apply 2 g topically 4 (four) times daily as needed (for pain)., Disp: , Rfl:    apixaban (ELIQUIS) 2.5 MG TABS tablet, Take 1 tablet (2.5 mg total) by mouth 2 (two) times daily., Disp: 180 tablet, Rfl: 3   budesonide (PULMICORT) 0.5 MG/2ML nebulizer solution, Take 2 mLs (0.5 mg total) by nebulization in the morning and at bedtime. (Patient taking differently: Take 0.5 mg by nebulization daily.), Disp: 120 mL, Rfl: 1   busPIRone (BUSPAR) 7.5 MG tablet, Take 7.5 mg by mouth See admin instructions. Take 7.5 mg by mouth at bedtime and an additional 7.5 mg once a day as needed for anxiety, Disp: , Rfl:    Cholecalciferol (VITAMIN D3) 125 MCG (5000 UT) CAPS, Take 1 capsule by mouth daily., Disp: , Rfl:    dapagliflozin propanediol (FARXIGA) 10 MG TABS tablet, Take 1 tablet (10 mg total) by mouth daily before breakfast., Disp: 30 tablet, Rfl: 6   diclofenac sodium (VOLTAREN) 1 % GEL, Apply 2.25 g topically 3 (three) times daily as needed (for pain)., Disp: , Rfl:    furosemide (LASIX) 20 MG tablet, Take 1 tablet (20 mg total) by mouth daily., Disp: 90  tablet, Rfl: 0   HYDROcodone-acetaminophen (NORCO) 7.5-325 MG tablet, Take 1 tablet by mouth every 6 (six) hours as needed for moderate pain. (Patient taking differently: Take 1 tablet by mouth daily as needed for moderate pain.), Disp: 20 tablet, Rfl: 0   latanoprost (XALATAN)  0.005 % ophthalmic solution, Place 1 drop into both eyes at bedtime., Disp: , Rfl:    levothyroxine (SYNTHROID) 125 MCG tablet, Take 125 mcg by mouth daily before breakfast. , Disp: , Rfl:    melatonin 3 MG TABS tablet, Take 3 mg by mouth at bedtime., Disp: , Rfl:    modafinil (PROVIGIL) 100 MG tablet, Take 100 mg by mouth every morning., Disp: , Rfl:    Multiple Vitamins-Minerals (PRESERVISION AREDS 2 PO), Take by mouth., Disp: , Rfl:    nortriptyline (PAMELOR) 10 MG capsule, Take 10 mg by mouth daily., Disp: , Rfl:    rOPINIRole (REQUIP) 1 MG tablet, Take 1 mg by mouth See admin instructions. Take 1 mg by mouth at 1 PM and 2 mg at 9 PM, Disp: , Rfl: 12   sacubitril-valsartan (ENTRESTO) 24-26 MG, Take 1 tablet by mouth 2 (two) times daily., Disp: , Rfl: 0   spironolactone (ALDACTONE) 25 MG tablet, Take 25 mg by mouth daily., Disp: , Rfl:    SYSTANE COMPLETE PF 0.6 % SOLN, Place 1 drop into both eyes 4 (four) times daily as needed (for dryness)., Disp: , Rfl:    Assessment     ICD-10-CM   1. Paroxysmal atrial fibrillation (HCC)  I48.0 EKG 12-Lead    LONG TERM MONITOR (3-14 DAYS)    ECHOCARDIOGRAM COMPLETE    2. Chronic diastolic (congestive) heart failure (HCC)  I50.32     3. Sinus node dysfunction (HCC)  I49.5 LONG TERM MONITOR (3-14 DAYS)    4. Moderate aortic stenosis  I35.0 ECHOCARDIOGRAM COMPLETE    5. Primary hypertension  I10      This patients CHA2DS2-VASc Score 6 (CHF, HTN, vasc, age, F) and yearly risk of stroke 9.8%.  No orders of the defined types were placed in this encounter.  Medications Discontinued During This Encounter  Medication Reason   amiodarone (PACERONE) 100 MG tablet    carvedilol (COREG) 3.125 MG tablet    ipratropium (ATROVENT) 0.03 % nasal spray    Recommendations:   Lisa Mcclure  is a 89 y.o.female  with  obesity, chronic diastolic CHF, hypertensive heart disease and chronic 3A-B kidney disease, paroxysmal atrial fibrillation,  anemia secondary  to hemorrhoids and diverticulosis, esophageal reflux, depression, OSA intolerant to CPAP on nocturnal 02, rheumatoid arthritis, and venous insufficiency, aortic and coronary calcification, s/p AAA repair on 12/20/2019. Patient underwent pseudoaneurysm sac repair of the AAA on 10/24/2022 and repair of the endoleak. Dry weight 195 Lbs   Her last hospitalization was on 11/29/2022 for 3 days with acute decompensated diastolic heart failure and she had maintained sinus rhythm during the hospitalization.  Has home health and now presents for follow-up.  1. Chronic diastolic (congestive) heart failure (HCC) Patient is now well compensated and her weight has remained stable.  I received several phone calls regarding occasional episodes of elevated blood pressure but more importantly several episodes of marked bradycardia with heart rate dropping down to 30 to 40 bpm.  Patient states that with regard to dyspnea or orthopnea, symptoms have stabilized and she is presently doing well but states that her life has become miserable as quality of life is bad as she still has chronic dyspnea and  her activity levels are still limited.  2. Paroxysmal atrial fibrillation (HCC) She is presently maintaining sinus rhythm. - EKG 12-Lead - LONG TERM MONITOR (3-14 DAYS); Future - ECHOCARDIOGRAM COMPLETE; Future  3. Sinus node dysfunction (HCC) Patient states that she has had several episodes where she suddenly feels she is going to pass out, feels very dizzy, feels very weak and heart rate drops down to 30 to 40 bpm.  Symptoms may suggest sinus node dysfunction, she may need a pacemaker implantation.  I will set her up for Zio patch monitoring.  - LONG TERM MONITOR (3-14 DAYS); Future  4. Moderate aortic stenosis Patient has moderate aortic stenosis.  Although aortic stenosis is moderate, in view of recurrent episodes of diastolic heart failure, we may consider TAVR.  Will start with event monitoring first to see whether she  has significant bradycardia that may need to be addressed and then make further recommendations.  I may consider referral to structural heart team for evaluation of the same.  Although 89 years of age, still tries to do her best to take care of herself, lives independently.  - ECHOCARDIOGRAM COMPLETE; Future  5. Primary hypertension Blood pressure is in the upper limit of normal however I did not make any changes.  She is presently on lowest dose of Entresto and also on spironolactone along with furosemide.  Beta-blocker has been discontinued due to bradycardia.  I will see her back in 4 weeks for follow-up.  This was a 40-minute encounter in reviewing her hospitalization records again, very complex presentation and discussion regarding options of arrhythmias, need for potential pacemaker, structural heart team evaluation, telephone encounter evaluations and reconciliation of medications.    Yates Decamp, MD, [facility] 12/25/2022, 9:00 AM Office: [phone] Fax: [phone] Pager: [phone]

## 2022-12-23 ENCOUNTER — Ambulatory Visit: Payer: Medicare Other | Attending: Cardiology

## 2022-12-23 ENCOUNTER — Telehealth: Payer: Self-pay | Admitting: Family Medicine

## 2022-12-23 DIAGNOSIS — G8929 Other chronic pain: Secondary | ICD-10-CM

## 2022-12-23 DIAGNOSIS — I480 Paroxysmal atrial fibrillation: Secondary | ICD-10-CM

## 2022-12-23 DIAGNOSIS — I130 Hypertensive heart and chronic kidney disease with heart failure and stage 1 through stage 4 chronic kidney disease, or unspecified chronic kidney disease: Secondary | ICD-10-CM | POA: Diagnosis not present

## 2022-12-23 DIAGNOSIS — I5043 Acute on chronic combined systolic (congestive) and diastolic (congestive) heart failure: Secondary | ICD-10-CM | POA: Diagnosis not present

## 2022-12-23 DIAGNOSIS — J9601 Acute respiratory failure with hypoxia: Secondary | ICD-10-CM | POA: Diagnosis not present

## 2022-12-23 DIAGNOSIS — D631 Anemia in chronic kidney disease: Secondary | ICD-10-CM | POA: Diagnosis not present

## 2022-12-23 DIAGNOSIS — N1832 Chronic kidney disease, stage 3b: Secondary | ICD-10-CM | POA: Diagnosis not present

## 2022-12-23 DIAGNOSIS — I495 Sick sinus syndrome: Secondary | ICD-10-CM

## 2022-12-23 NOTE — Progress Notes (Unsigned)
Enrolled patient for a 14 day Zio XT  monitor to be mailed to patients home  °

## 2022-12-23 NOTE — Telephone Encounter (Signed)
Per visit note 06/13/32:  Assessment and Plan: 89 y.o. female with bilateral shoulder pain due to his considerable glenohumeral DJD. She is running out of conservative management options.  Recommend try another injection in the left shoulder today and proceed with a little more physical therapy but I am not optimistic.  She is not a surgical candidate given her other medical issues and how poor the bone is in the glenoid (she has been told this by Dr. Rennis Chris in the past). The only other option would be a trial of an ablation of the nerves provide sensation to the glenohumeral joint.  Would refer potentially to Dr. Lorrine Kin or other pain management or PM&R doctors.

## 2022-12-23 NOTE — Telephone Encounter (Signed)
Order/referral has been placed to [facility] for a consult with Dr. Lorrine Kin for ablation.

## 2022-12-23 NOTE — Telephone Encounter (Signed)
[name] called stating that the patient is not feeling any better. She mentioned a referral or order for a nerve ablation?  Please advise if that would be the next steps.

## 2022-12-26 ENCOUNTER — Other Ambulatory Visit: Payer: Self-pay

## 2022-12-26 ENCOUNTER — Telehealth: Payer: Self-pay

## 2022-12-26 DIAGNOSIS — G8929 Other chronic pain: Secondary | ICD-10-CM

## 2022-12-26 NOTE — Telephone Encounter (Signed)
Morphies, Freddy Jaksch, MD; Gu[place], Verdell Carmine, [place], [place]; Evon Slack, CMA [facility] called stating that Dr Lorrine Kin does not do this type of procedure and recommended [facility] or [facility]. Please advise on where you would like the referral sent.  Thanks! Colon Branch

## 2022-12-27 DIAGNOSIS — I480 Paroxysmal atrial fibrillation: Secondary | ICD-10-CM | POA: Diagnosis not present

## 2022-12-27 DIAGNOSIS — J9601 Acute respiratory failure with hypoxia: Secondary | ICD-10-CM | POA: Diagnosis not present

## 2022-12-27 DIAGNOSIS — N1832 Chronic kidney disease, stage 3b: Secondary | ICD-10-CM | POA: Diagnosis not present

## 2022-12-27 DIAGNOSIS — I5043 Acute on chronic combined systolic (congestive) and diastolic (congestive) heart failure: Secondary | ICD-10-CM | POA: Diagnosis not present

## 2022-12-27 DIAGNOSIS — D631 Anemia in chronic kidney disease: Secondary | ICD-10-CM | POA: Diagnosis not present

## 2022-12-27 DIAGNOSIS — I130 Hypertensive heart and chronic kidney disease with heart failure and stage 1 through stage 4 chronic kidney disease, or unspecified chronic kidney disease: Secondary | ICD-10-CM | POA: Diagnosis not present

## 2022-12-29 DIAGNOSIS — N1832 Chronic kidney disease, stage 3b: Secondary | ICD-10-CM | POA: Diagnosis not present

## 2022-12-29 DIAGNOSIS — I5043 Acute on chronic combined systolic (congestive) and diastolic (congestive) heart failure: Secondary | ICD-10-CM | POA: Diagnosis not present

## 2022-12-29 DIAGNOSIS — I480 Paroxysmal atrial fibrillation: Secondary | ICD-10-CM | POA: Diagnosis not present

## 2022-12-29 DIAGNOSIS — I495 Sick sinus syndrome: Secondary | ICD-10-CM | POA: Diagnosis not present

## 2022-12-29 DIAGNOSIS — D631 Anemia in chronic kidney disease: Secondary | ICD-10-CM | POA: Diagnosis not present

## 2022-12-29 DIAGNOSIS — J9601 Acute respiratory failure with hypoxia: Secondary | ICD-10-CM | POA: Diagnosis not present

## 2022-12-29 DIAGNOSIS — I130 Hypertensive heart and chronic kidney disease with heart failure and stage 1 through stage 4 chronic kidney disease, or unspecified chronic kidney disease: Secondary | ICD-10-CM | POA: Diagnosis not present

## 2022-12-29 NOTE — Telephone Encounter (Signed)
[facility] uses [facility]

## 2022-12-30 DIAGNOSIS — I5043 Acute on chronic combined systolic (congestive) and diastolic (congestive) heart failure: Secondary | ICD-10-CM | POA: Diagnosis not present

## 2022-12-30 DIAGNOSIS — I480 Paroxysmal atrial fibrillation: Secondary | ICD-10-CM | POA: Diagnosis not present

## 2022-12-30 DIAGNOSIS — D631 Anemia in chronic kidney disease: Secondary | ICD-10-CM | POA: Diagnosis not present

## 2022-12-30 DIAGNOSIS — N1832 Chronic kidney disease, stage 3b: Secondary | ICD-10-CM | POA: Diagnosis not present

## 2022-12-30 DIAGNOSIS — I130 Hypertensive heart and chronic kidney disease with heart failure and stage 1 through stage 4 chronic kidney disease, or unspecified chronic kidney disease: Secondary | ICD-10-CM | POA: Diagnosis not present

## 2022-12-30 DIAGNOSIS — J9601 Acute respiratory failure with hypoxia: Secondary | ICD-10-CM | POA: Diagnosis not present

## 2023-01-03 DIAGNOSIS — I5043 Acute on chronic combined systolic (congestive) and diastolic (congestive) heart failure: Secondary | ICD-10-CM | POA: Diagnosis not present

## 2023-01-03 DIAGNOSIS — N1832 Chronic kidney disease, stage 3b: Secondary | ICD-10-CM | POA: Diagnosis not present

## 2023-01-03 DIAGNOSIS — D631 Anemia in chronic kidney disease: Secondary | ICD-10-CM | POA: Diagnosis not present

## 2023-01-03 DIAGNOSIS — I480 Paroxysmal atrial fibrillation: Secondary | ICD-10-CM | POA: Diagnosis not present

## 2023-01-03 DIAGNOSIS — J9601 Acute respiratory failure with hypoxia: Secondary | ICD-10-CM | POA: Diagnosis not present

## 2023-01-03 DIAGNOSIS — I130 Hypertensive heart and chronic kidney disease with heart failure and stage 1 through stage 4 chronic kidney disease, or unspecified chronic kidney disease: Secondary | ICD-10-CM | POA: Diagnosis not present

## 2023-01-05 ENCOUNTER — Telehealth: Payer: Self-pay | Admitting: Cardiology

## 2023-01-05 DIAGNOSIS — D631 Anemia in chronic kidney disease: Secondary | ICD-10-CM | POA: Diagnosis not present

## 2023-01-05 DIAGNOSIS — I130 Hypertensive heart and chronic kidney disease with heart failure and stage 1 through stage 4 chronic kidney disease, or unspecified chronic kidney disease: Secondary | ICD-10-CM | POA: Diagnosis not present

## 2023-01-05 DIAGNOSIS — K219 Gastro-esophageal reflux disease without esophagitis: Secondary | ICD-10-CM | POA: Diagnosis not present

## 2023-01-05 DIAGNOSIS — E669 Obesity, unspecified: Secondary | ICD-10-CM | POA: Diagnosis not present

## 2023-01-05 DIAGNOSIS — N1832 Chronic kidney disease, stage 3b: Secondary | ICD-10-CM | POA: Diagnosis not present

## 2023-01-05 DIAGNOSIS — M545 Low back pain, unspecified: Secondary | ICD-10-CM | POA: Diagnosis not present

## 2023-01-05 DIAGNOSIS — J9601 Acute respiratory failure with hypoxia: Secondary | ICD-10-CM | POA: Diagnosis not present

## 2023-01-05 DIAGNOSIS — I480 Paroxysmal atrial fibrillation: Secondary | ICD-10-CM | POA: Diagnosis not present

## 2023-01-05 DIAGNOSIS — G2581 Restless legs syndrome: Secondary | ICD-10-CM | POA: Diagnosis not present

## 2023-01-05 DIAGNOSIS — I5043 Acute on chronic combined systolic (congestive) and diastolic (congestive) heart failure: Secondary | ICD-10-CM | POA: Diagnosis not present

## 2023-01-05 DIAGNOSIS — G4700 Insomnia, unspecified: Secondary | ICD-10-CM | POA: Diagnosis not present

## 2023-01-05 DIAGNOSIS — I350 Nonrheumatic aortic (valve) stenosis: Secondary | ICD-10-CM | POA: Diagnosis not present

## 2023-01-05 DIAGNOSIS — N3281 Overactive bladder: Secondary | ICD-10-CM | POA: Diagnosis not present

## 2023-01-05 DIAGNOSIS — K5790 Diverticulosis of intestine, part unspecified, without perforation or abscess without bleeding: Secondary | ICD-10-CM | POA: Diagnosis not present

## 2023-01-05 DIAGNOSIS — I872 Venous insufficiency (chronic) (peripheral): Secondary | ICD-10-CM | POA: Diagnosis not present

## 2023-01-05 DIAGNOSIS — M19011 Primary osteoarthritis, right shoulder: Secondary | ICD-10-CM | POA: Diagnosis not present

## 2023-01-05 DIAGNOSIS — F419 Anxiety disorder, unspecified: Secondary | ICD-10-CM | POA: Diagnosis not present

## 2023-01-05 DIAGNOSIS — G4733 Obstructive sleep apnea (adult) (pediatric): Secondary | ICD-10-CM | POA: Diagnosis not present

## 2023-01-05 DIAGNOSIS — E039 Hypothyroidism, unspecified: Secondary | ICD-10-CM | POA: Diagnosis not present

## 2023-01-05 DIAGNOSIS — I3139 Other pericardial effusion (noninflammatory): Secondary | ICD-10-CM | POA: Diagnosis not present

## 2023-01-05 DIAGNOSIS — J449 Chronic obstructive pulmonary disease, unspecified: Secondary | ICD-10-CM | POA: Diagnosis not present

## 2023-01-05 DIAGNOSIS — M19012 Primary osteoarthritis, left shoulder: Secondary | ICD-10-CM | POA: Diagnosis not present

## 2023-01-05 DIAGNOSIS — M069 Rheumatoid arthritis, unspecified: Secondary | ICD-10-CM | POA: Diagnosis not present

## 2023-01-05 DIAGNOSIS — I513 Intracardiac thrombosis, not elsewhere classified: Secondary | ICD-10-CM | POA: Diagnosis not present

## 2023-01-05 DIAGNOSIS — F32A Depression, unspecified: Secondary | ICD-10-CM | POA: Diagnosis not present

## 2023-01-05 NOTE — Telephone Encounter (Signed)
Left message to call back  

## 2023-01-05 NOTE — Telephone Encounter (Signed)
Pt's Diannia Ruder nurse Waynetta Sandy is requesting a callback regarding pt's home health visit. Please advise

## 2023-01-09 DIAGNOSIS — Z79899 Other long term (current) drug therapy: Secondary | ICD-10-CM | POA: Diagnosis not present

## 2023-01-09 DIAGNOSIS — M25511 Pain in right shoulder: Secondary | ICD-10-CM | POA: Diagnosis not present

## 2023-01-09 DIAGNOSIS — Z1389 Encounter for screening for other disorder: Secondary | ICD-10-CM | POA: Diagnosis not present

## 2023-01-09 DIAGNOSIS — Z0000 Encounter for general adult medical examination without abnormal findings: Secondary | ICD-10-CM | POA: Diagnosis not present

## 2023-01-09 DIAGNOSIS — I4891 Unspecified atrial fibrillation: Secondary | ICD-10-CM | POA: Diagnosis not present

## 2023-01-09 DIAGNOSIS — I495 Sick sinus syndrome: Secondary | ICD-10-CM | POA: Diagnosis not present

## 2023-01-09 DIAGNOSIS — M25512 Pain in left shoulder: Secondary | ICD-10-CM | POA: Diagnosis not present

## 2023-01-09 DIAGNOSIS — L97929 Non-pressure chronic ulcer of unspecified part of left lower leg with unspecified severity: Secondary | ICD-10-CM | POA: Diagnosis not present

## 2023-01-09 DIAGNOSIS — I110 Hypertensive heart disease with heart failure: Secondary | ICD-10-CM | POA: Diagnosis not present

## 2023-01-09 DIAGNOSIS — L03116 Cellulitis of left lower limb: Secondary | ICD-10-CM | POA: Diagnosis not present

## 2023-01-09 DIAGNOSIS — I513 Intracardiac thrombosis, not elsewhere classified: Secondary | ICD-10-CM | POA: Diagnosis not present

## 2023-01-09 DIAGNOSIS — T148XXA Other injury of unspecified body region, initial encounter: Secondary | ICD-10-CM | POA: Diagnosis not present

## 2023-01-09 DIAGNOSIS — I872 Venous insufficiency (chronic) (peripheral): Secondary | ICD-10-CM | POA: Diagnosis not present

## 2023-01-09 DIAGNOSIS — I5032 Chronic diastolic (congestive) heart failure: Secondary | ICD-10-CM | POA: Diagnosis not present

## 2023-01-10 DIAGNOSIS — M069 Rheumatoid arthritis, unspecified: Secondary | ICD-10-CM | POA: Diagnosis not present

## 2023-01-10 DIAGNOSIS — I350 Nonrheumatic aortic (valve) stenosis: Secondary | ICD-10-CM | POA: Diagnosis not present

## 2023-01-10 DIAGNOSIS — I3139 Other pericardial effusion (noninflammatory): Secondary | ICD-10-CM | POA: Diagnosis not present

## 2023-01-10 DIAGNOSIS — J449 Chronic obstructive pulmonary disease, unspecified: Secondary | ICD-10-CM | POA: Diagnosis not present

## 2023-01-10 DIAGNOSIS — J9601 Acute respiratory failure with hypoxia: Secondary | ICD-10-CM | POA: Diagnosis not present

## 2023-01-10 DIAGNOSIS — I5043 Acute on chronic combined systolic (congestive) and diastolic (congestive) heart failure: Secondary | ICD-10-CM | POA: Diagnosis not present

## 2023-01-10 DIAGNOSIS — I513 Intracardiac thrombosis, not elsewhere classified: Secondary | ICD-10-CM | POA: Diagnosis not present

## 2023-01-10 DIAGNOSIS — I480 Paroxysmal atrial fibrillation: Secondary | ICD-10-CM | POA: Diagnosis not present

## 2023-01-10 DIAGNOSIS — D631 Anemia in chronic kidney disease: Secondary | ICD-10-CM | POA: Diagnosis not present

## 2023-01-10 DIAGNOSIS — N1832 Chronic kidney disease, stage 3b: Secondary | ICD-10-CM | POA: Diagnosis not present

## 2023-01-10 DIAGNOSIS — I872 Venous insufficiency (chronic) (peripheral): Secondary | ICD-10-CM | POA: Diagnosis not present

## 2023-01-10 DIAGNOSIS — I130 Hypertensive heart and chronic kidney disease with heart failure and stage 1 through stage 4 chronic kidney disease, or unspecified chronic kidney disease: Secondary | ICD-10-CM | POA: Diagnosis not present

## 2023-01-12 DIAGNOSIS — I480 Paroxysmal atrial fibrillation: Secondary | ICD-10-CM | POA: Diagnosis not present

## 2023-01-12 DIAGNOSIS — D631 Anemia in chronic kidney disease: Secondary | ICD-10-CM | POA: Diagnosis not present

## 2023-01-12 DIAGNOSIS — J9601 Acute respiratory failure with hypoxia: Secondary | ICD-10-CM | POA: Diagnosis not present

## 2023-01-12 DIAGNOSIS — N1832 Chronic kidney disease, stage 3b: Secondary | ICD-10-CM | POA: Diagnosis not present

## 2023-01-12 DIAGNOSIS — I5043 Acute on chronic combined systolic (congestive) and diastolic (congestive) heart failure: Secondary | ICD-10-CM | POA: Diagnosis not present

## 2023-01-12 DIAGNOSIS — I130 Hypertensive heart and chronic kidney disease with heart failure and stage 1 through stage 4 chronic kidney disease, or unspecified chronic kidney disease: Secondary | ICD-10-CM | POA: Diagnosis not present

## 2023-01-12 NOTE — Telephone Encounter (Signed)
Pt is requesting a call back. Please advise

## 2023-01-12 NOTE — Telephone Encounter (Signed)
Lisa Mcclure from [facility] [place] was returning nurse call and is requesting a callback at [phone]. Please advise

## 2023-01-12 NOTE — Telephone Encounter (Signed)
Left message to call back  

## 2023-01-12 NOTE — Telephone Encounter (Signed)
Called Beth with [facility]. She stated patient has been having low heart rates, and she saw patient today and her HR was in the 40's. Patient has been fatigue and dizzy. Patient saw her PCP yesterday and they told her she needed to get in at her cardiologist. Called patient and made her an appointment to see Eligha Bridegroom NP, tomorrow. Will get an EKG at office visit.

## 2023-01-13 ENCOUNTER — Encounter: Payer: Self-pay | Admitting: Nurse Practitioner

## 2023-01-13 ENCOUNTER — Ambulatory Visit: Payer: Medicare Other | Attending: Nurse Practitioner | Admitting: Nurse Practitioner

## 2023-01-13 VITALS — BP 138/70 | HR 100 | Ht 63.0 in | Wt 202.2 lb

## 2023-01-13 DIAGNOSIS — D6869 Other thrombophilia: Secondary | ICD-10-CM | POA: Diagnosis not present

## 2023-01-13 DIAGNOSIS — I7143 Infrarenal abdominal aortic aneurysm, without rupture: Secondary | ICD-10-CM | POA: Insufficient documentation

## 2023-01-13 DIAGNOSIS — I350 Nonrheumatic aortic (valve) stenosis: Secondary | ICD-10-CM | POA: Diagnosis not present

## 2023-01-13 DIAGNOSIS — Z9889 Other specified postprocedural states: Secondary | ICD-10-CM | POA: Diagnosis not present

## 2023-01-13 DIAGNOSIS — I480 Paroxysmal atrial fibrillation: Secondary | ICD-10-CM | POA: Diagnosis not present

## 2023-01-13 DIAGNOSIS — Z8679 Personal history of other diseases of the circulatory system: Secondary | ICD-10-CM | POA: Diagnosis not present

## 2023-01-13 DIAGNOSIS — I5032 Chronic diastolic (congestive) heart failure: Secondary | ICD-10-CM | POA: Diagnosis not present

## 2023-01-13 DIAGNOSIS — R5383 Other fatigue: Secondary | ICD-10-CM | POA: Insufficient documentation

## 2023-01-13 NOTE — Progress Notes (Signed)
Cardiology Office Note:  .   Date:  01/13/2023  ID:  Lisa Mcclure, DOB [birth year], MRN [id number] PCP: Garlan Fillers, MD  [facility] Providers Cardiologist:  None    Patient Profile: .      PMH Chronic HFpEF Obesity Hypertensive heart disease Chronic kidney disease PAF No beta blocker in setting of bradycardia Anemia OSA  On nocturnal O2 Intolerant of CPAP Venous insufficiency Aortic stenosis Moderate AS on  Abdominal aortic aneurysm S/p repair 12/20/2019 Pseudoaneurysm sac repair 10/24/2022 Coronary calcification Lexiscan Myoview low risk 12/08/2017 Aortic atherosclerosis GERD Depression  [facility] cardiology patient with the above outlined medical history.  She has a history of anemia secondary to hemorrhoids and diverticulitis.  She has a history of AAA with repair on 12/20/2019 followed by Dr. Chestine Spore, [facility]. She underwent sac repair of pseudoaneurysm by IR on 10/24/2022.   She was admitted 11/29/2022 for 3 days with acute decompensated diastolic heart failure.  She maintained sinus rhythm during the hospitalization.  Had home health following discharge.   She reported feeling faint, markedly fatigued and tired at follow-up appointment with Dr. Jacinto Halim 12/22/2022.  Heart rate dropping to 30 to 40 bpm.  Dyspnea stable since hospitalization, no further PND or leg edema.  She was being careful with her diet. No chest pain. Dry weight 195 lb. Echocardiogram and cardiac monitor were ordered for surveillance.        History of Present Illness: .   Lisa Mcclure is a very pleasant 89 y.o. female  who is here with her daughter today. The patient, with a history of aortic valve issues, presents with complaints of extreme fatigue and lack of energy. She describes feeling drained and unable to muster the energy to perform simple tasks such as walking to the kitchen. This lack of energy is a significant change from her previous state and has been progressively worsening,  worse since when she saw Dr. Jacinto Halim on 9/12. She also reports irregular heart rate, with readings fluctuating between very low and high. She has been experiencing frequent premature ventricular contractions (PVCs), which have been confirmed by EKG. She gets up multiple times during the night, indicating possible nocturia. Despite these symptoms, the patient denies experiencing chest discomfort or feelings of impending faintness. Weight has increased approximately 4 lbs at home.  She denies this orthopnea or PND.  She has chronic LE edema that appears to at least be partly due to venous stasis. She denies dyspnea or abdominal bloating.   ROS: See HPI       Studies Reviewed: .         Risk Assessment/Calculations:    CHA2DS2-VASc Score = 6   This indicates a 9.7% annual risk of stroke. The patient's score is based upon: CHF History: 1 HTN History: 1 Diabetes History: 0 Stroke History: 0 Vascular Disease History: 1 Age Score: 2 Gender Score: 1            Physical Exam:   VS:  BP 138/70   Pulse 100   Ht 5\' 3"  (1.6 m)   Wt 202 lb 3.2 oz (91.7 kg)   SpO2 98%   BMI 35.82 kg/m    Wt Readings from Last 3 Encounters:  01/13/23 202 lb 3.2 oz (91.7 kg)  12/22/22 201 lb (91.2 kg)  12/16/22 202 lb (91.6 kg)    GEN: Well nourished, well developed in no acute distress NECK: No JVD; No carotid bruits CARDIAC: RRR, 4/6 holosystolic murmur RESPIRATORY:  Clear to  auscultation without rales, wheezing or rhonchi  ABDOMEN: Soft, non-tender, non-distended EXTREMITIES:  No edema; No deformity     ASSESSMENT AND PLAN: .    Fatigue: Progressive worsening of symptoms.  She has frequent PVCs and at times home monitor reveals HR as low as 30s.  This is felt to be erroneous in the setting of irregular HR.  As noted below, we are awaiting results of echo for aortic stenosis and monitor results to evaluate for significant bradycardia or heart block. Encourage good hydration and nutrition. Advise patient  to continue with daily activities as tolerated and to observe precautions with home monitor and assistive devices for ambulation.   Aortic stenosis: Moderate to severe aortic stenosis previously diagnosed.  We are awaiting repeat echo which is scheduled for 1 week from today.  Symptoms of fatigue and decreased energy increased over the last month. Per Dr. Jacinto Halim, consider referral to structural heart team based on results of upcoming echo and cardiac monitor.  Notify us if symptoms worsen.  Continue daily Lasix.  PAF on chronic anticoagulation: Recent EKG at PCP reveals sinus rhythm with frequent PVCs. HR is well controlled. She is no longer on rate control or AAD due to concern for fatigue and bradycardia.  No bleeding concerns.  Continue Eliquis 2.5 mg twice daily which is dose selected by primary cardiologist due to bleeding risk even though by age/weight/creatinine she qualifies for 5 mg twice daily. Not felt to be a candidate for Watchman.   Chronic HFpEF: Weight increase of 4 lbs at home. She denies increased shortness of breath, orthopnea, or PND.  Does admit to worsening fatigue.  BP is stable.  Bilateral LE edema is stable.  We will continue Farxiga, Lasix, Entresto, spironolactone. Renal function stable on lab work completed 12/02/2022.  AAA: Not specifically addressed today. No specific concerns.        Dispo: Keep your appointment in 2 weeks with Dr. Jacinto Halim  Signed, Eligha Bridegroom, NP-C

## 2023-01-13 NOTE — Patient Instructions (Signed)
Medication Instructions:   Your physician recommends that you continue on your current medications as directed. Please refer to the Current Medication list given to you today.  *If you need a refill on your cardiac medications before your next appointment, please call your pharmacy*   Lab Work:  None ordered.  If you have labs (blood work) drawn today and your tests are completely normal, you will receive your results only by: MyChart Message (if you have MyChart) OR A paper copy in the mail If you have any lab test that is abnormal or we need to change your treatment, we will call you to review the results.   Testing/Procedures:  Patient given reading material today for TVAR   Follow-Up: At [facility], you and your health needs are our priority.  As part of our continuing mission to provide you with exceptional heart care, we have created designated Provider Care Teams.  These Care Teams include your primary Cardiologist (physician) and Advanced Practice Providers (APPs -  Physician Assistants and Nurse Practitioners) who all work together to provide you with the care you need, when you need it.  We recommend signing up for the patient portal called "MyChart".  Sign up information is provided on this After Visit Summary.  MyChart is used to connect with patients for Virtual Visits (Telemedicine).  Patients are able to view lab/test results, encounter notes, upcoming appointments, etc.  Non-urgent messages can be sent to your provider as well.   To learn more about what you can do with MyChart, go to [email or web address].    Your next appointment:   2 week(s)  Provider:   Yates Decamp, MD

## 2023-01-16 DIAGNOSIS — I495 Sick sinus syndrome: Secondary | ICD-10-CM | POA: Diagnosis not present

## 2023-01-16 DIAGNOSIS — N1832 Chronic kidney disease, stage 3b: Secondary | ICD-10-CM | POA: Diagnosis not present

## 2023-01-16 DIAGNOSIS — D631 Anemia in chronic kidney disease: Secondary | ICD-10-CM | POA: Diagnosis not present

## 2023-01-16 DIAGNOSIS — I5043 Acute on chronic combined systolic (congestive) and diastolic (congestive) heart failure: Secondary | ICD-10-CM | POA: Diagnosis not present

## 2023-01-16 DIAGNOSIS — I480 Paroxysmal atrial fibrillation: Secondary | ICD-10-CM | POA: Diagnosis not present

## 2023-01-16 DIAGNOSIS — J9601 Acute respiratory failure with hypoxia: Secondary | ICD-10-CM | POA: Diagnosis not present

## 2023-01-16 DIAGNOSIS — I130 Hypertensive heart and chronic kidney disease with heart failure and stage 1 through stage 4 chronic kidney disease, or unspecified chronic kidney disease: Secondary | ICD-10-CM | POA: Diagnosis not present

## 2023-01-18 ENCOUNTER — Telehealth: Payer: Self-pay | Admitting: Cardiology

## 2023-01-18 DIAGNOSIS — I5043 Acute on chronic combined systolic (congestive) and diastolic (congestive) heart failure: Secondary | ICD-10-CM | POA: Diagnosis not present

## 2023-01-18 DIAGNOSIS — I480 Paroxysmal atrial fibrillation: Secondary | ICD-10-CM | POA: Diagnosis not present

## 2023-01-18 DIAGNOSIS — D631 Anemia in chronic kidney disease: Secondary | ICD-10-CM | POA: Diagnosis not present

## 2023-01-18 DIAGNOSIS — J9601 Acute respiratory failure with hypoxia: Secondary | ICD-10-CM | POA: Diagnosis not present

## 2023-01-18 DIAGNOSIS — I130 Hypertensive heart and chronic kidney disease with heart failure and stage 1 through stage 4 chronic kidney disease, or unspecified chronic kidney disease: Secondary | ICD-10-CM | POA: Diagnosis not present

## 2023-01-18 DIAGNOSIS — N1832 Chronic kidney disease, stage 3b: Secondary | ICD-10-CM | POA: Diagnosis not present

## 2023-01-18 NOTE — Telephone Encounter (Signed)
   Your heart monitor does not show anything like low HR and as we thought about, the low heart rate is erroneous and is related to extra heartbeats.   Premature ventricular complexes (PVC) means extra heartbeat from the lower chamber of the heart.  These are very common and are not dangerous.  Extra skipped beat coming from the bottom chamber (ventricle) and mostly are life altering (nuisance) than life threatening and mostly treated by reassurance.   There may not be any specific reasons for this, however patients with excessive caffeine, anxiety, lack of sleep, alcohol or thyroid problems can have these episodes.  Rarely patients with block coronary arteries or family history of heart muscle disease may be the reason.  If more questions, he can discuss with her doctor on office visit.   Will wait on the echo    Called Beth with [facility], informed her of patient's monitor results. She stated she listened to patient for a full minute and patient's rhythm was regular and she did not hear any PVC's. She stated patient was asymptotic. Will send to Dr. Jacinto Halim for advisement.

## 2023-01-18 NOTE — Telephone Encounter (Signed)
RN Beth requested we call her at [phone]. Spoke with Triage and was told to send a phone encounter since Waynetta Sandy is no longer with the patient. Please advise.

## 2023-01-18 NOTE — Telephone Encounter (Signed)
STAT if HR is under 50 or over 120 (normal HR is 60-100 beats per minute)  What is your heart rate? 36 today  Do you have a log of your heart rate readings (document readings)? No  Do you have any other symptoms? No change in symptoms   RN Beth from [facility] is calling to inform us that the patient's heart rate was 36 this morning.

## 2023-01-19 DIAGNOSIS — H353123 Nonexudative age-related macular degeneration, left eye, advanced atrophic without subfoveal involvement: Secondary | ICD-10-CM | POA: Diagnosis not present

## 2023-01-19 DIAGNOSIS — H35033 Hypertensive retinopathy, bilateral: Secondary | ICD-10-CM | POA: Diagnosis not present

## 2023-01-19 DIAGNOSIS — H43393 Other vitreous opacities, bilateral: Secondary | ICD-10-CM | POA: Diagnosis not present

## 2023-01-19 DIAGNOSIS — H43813 Vitreous degeneration, bilateral: Secondary | ICD-10-CM | POA: Diagnosis not present

## 2023-01-19 DIAGNOSIS — H353114 Nonexudative age-related macular degeneration, right eye, advanced atrophic with subfoveal involvement: Secondary | ICD-10-CM | POA: Diagnosis not present

## 2023-01-19 DIAGNOSIS — H353221 Exudative age-related macular degeneration, left eye, with active choroidal neovascularization: Secondary | ICD-10-CM | POA: Diagnosis not present

## 2023-01-20 ENCOUNTER — Ambulatory Visit (HOSPITAL_COMMUNITY): Payer: Medicare Other | Attending: Cardiology

## 2023-01-20 DIAGNOSIS — I350 Nonrheumatic aortic (valve) stenosis: Secondary | ICD-10-CM | POA: Insufficient documentation

## 2023-01-20 DIAGNOSIS — I480 Paroxysmal atrial fibrillation: Secondary | ICD-10-CM | POA: Insufficient documentation

## 2023-01-20 LAB — ECHOCARDIOGRAM COMPLETE
AR max vel: 1.12 cm2
AV Area VTI: 0.95 cm2
AV Area mean vel: 1.02 cm2
AV Mean grad: 19.5 mm[Hg]
AV Peak grad: 35.1 mm[Hg]
Ao pk vel: 2.96 m/s
Area-P 1/2: 4.18 cm2
S' Lateral: 1.7 cm

## 2023-01-23 NOTE — Progress Notes (Signed)
Echocardiogram 01/20/2023:  1. Left ventricular ejection fraction, by estimation, is 60 to 65%. The left ventricle has normal function. The left ventricle has no regional wall motion abnormalities. There is moderate concentric left ventricular hypertrophy. Left ventricular diastolic parameters are indeterminate.  2. Right ventricular systolic function is normal. The right ventricular size is normal.  3. Left atrial size was moderately dilated.  4. The mitral valve is degenerative. Mild mitral valve regurgitation. No evidence of mitral stenosis.  5. The aortic valve is tricuspid. There is severe calcifcation of the aortic valve. Aortic valve regurgitation is trivial. Moderate to severe aortic valve stenosis. Aortic valve area, by VTI measures 0.95 cm. Aortic valve mean gradient measures 19.5  mmHg. Aortic valve Vmax measures 2.96 m/s.  6. The inferior vena cava is normal in size with greater than 50% respiratory variability, suggesting right atrial pressure of 3 mmHg

## 2023-01-25 ENCOUNTER — Ambulatory Visit: Payer: Medicare Other | Attending: Cardiology | Admitting: Cardiology

## 2023-01-25 ENCOUNTER — Encounter: Payer: Self-pay | Admitting: Cardiology

## 2023-01-25 VITALS — BP 132/82 | HR 89 | Resp 16 | Ht 63.0 in | Wt 204.0 lb

## 2023-01-25 DIAGNOSIS — I5032 Chronic diastolic (congestive) heart failure: Secondary | ICD-10-CM | POA: Diagnosis not present

## 2023-01-25 DIAGNOSIS — Z66 Do not resuscitate: Secondary | ICD-10-CM | POA: Insufficient documentation

## 2023-01-25 DIAGNOSIS — I350 Nonrheumatic aortic (valve) stenosis: Secondary | ICD-10-CM | POA: Insufficient documentation

## 2023-01-25 DIAGNOSIS — R0609 Other forms of dyspnea: Secondary | ICD-10-CM | POA: Diagnosis not present

## 2023-01-25 NOTE — Patient Instructions (Addendum)
Medication Instructions:  Your physician recommends that you continue on your current medications as directed. Please refer to the Current Medication list given to you today.  *If you need a refill on your cardiac medications before your next appointment, please call your pharmacy*  Lab Work: TODAY: CBC, BMP, BNP If you have labs (blood work) drawn today and your tests are completely normal, you will receive your results only by: MyChart Message (if you have MyChart) OR A paper copy in the mail If you have any lab test that is abnormal or we need to change your treatment, we will call you to review the results.  Testing/Procedures: Your physician has requested that you have a cardiac catheterization. Cardiac catheterization is used to diagnose and/or treat various heart conditions. Doctors may recommend this procedure for a number of different reasons. The most common reason is to evaluate chest pain. Chest pain can be a symptom of coronary artery disease (CAD), and cardiac catheterization can show whether plaque is narrowing or blocking your heart's arteries. This procedure is also used to evaluate the valves, as well as measure the blood flow and oxygen levels in different parts of your heart. For further information please visit [email or web address] Please follow instruction sheet, as given.   Follow-Up: At [facility], you and your health needs are our priority.  As part of our continuing mission to provide you with exceptional heart care, we have created designated Provider Care Teams.  These Care Teams include your primary Cardiologist (physician) and Advanced Practice Providers (APPs -  Physician Assistants and Nurse Practitioners) who all work together to provide you with the care you need, when you need it.  Your next appointment:   3 months with Dr. Jacinto Halim  The format for your next appointment:   In Person  Provider:   Yates Decamp, MD   Other Instructions You have been referred  to our Structural Heart Team here at [facility] on [address]--a scheduler will call you to schedule an appointment to meet with one of our structural doctors.  We will call you to schedule your right and left cath.

## 2023-01-25 NOTE — Progress Notes (Signed)
Cardiology Office Note:  .   Date:  01/25/2023  ID:  Lisa Mcclure, DOB 1933-03-31, MRN [id number] PCP: Garlan Fillers, MD  [facility] Providers Cardiologist:  Yates Decamp, MD    History of Present Illness: .   Lisa Mcclure is a 89 y.o. female with obesity, chronic diastolic CHF, hypertensive heart disease and chronic 3A-B kidney disease, paroxysmal atrial fibrillation, anemia secondary to hemorrhoids and diverticulosis, esophageal reflux, depression, OSA intolerant to CPAP on nocturnal 02, rheumatoid arthritis, and venous insufficiency, aortic and coronary calcification, s/p AAA repair on 12/20/2019. Patient underwent pseudoaneurysm sac repair of the AAA on 10/24/2022 and repair of the endoleak. Dry weight 195 Lbs   Discussed the use of AI scribe software for clinical note transcription with the patient, who gave verbal consent to proceed.  History of Present Illness   The patient, with a history of heart failure, presents for follow-up after outpatient extended EKG monitoring and an echocardiogram. The patient reports feeling tired and experiencing shortness of breath with exertion. The patient describes feeling "okay" but not as well as she would like. The patient is able to move around the house without needing to rest frequently, but reports feeling tired and slightly short of breath when making the bed. The patient denies having a low heart rate, but reports feeling terrible when her heart rate is low. The patient's aortic valve is severely stenotic, but the patient is not currently in active heart failure.      Review of Systems  Constitutional: Positive for malaise/fatigue.  Cardiovascular:  Positive for dyspnea on exertion and near-syncope. Negative for chest pain and leg swelling.  Neurological:  Positive for dizziness.    Risk Assessment/Calculations:    CHA2DS2-VASc Score = 6   This indicates a 9.7% annual risk of stroke. The patient's score is based  upon: CHF History: 1 HTN History: 1 Diabetes History: 0 Stroke History: 0 Vascular Disease History: 1 Age Score: 2 Gender Score: 1             Lab Results  Component Value Date   CHOL 126 03/14/2019   HDL 49 03/14/2019   LDLCALC 61 03/14/2019   LDLDIRECT 159.2 12/19/2006   TRIG 82 03/14/2019   CHOLHDL 5.1 CALC 12/19/2006   Lab Results  Component Value Date   NA 136 12/02/2022   K 3.6 12/02/2022   CO2 29 12/02/2022   GLUCOSE 107 (H) 12/02/2022   BUN 33 (H) 12/02/2022   CREATININE 1.19 (H) 12/02/2022   CALCIUM 8.4 (L) 12/02/2022   GFR 71.14 08/09/2010   EGFR 50 (L) 06/28/2022   GFRNONAA 44 (L) 12/02/2022   Lab Results  Component Value Date   WBC 7.5 11/29/2022   HGB 13.0 11/29/2022   HCT 40.4 11/29/2022   MCV 96.0 11/29/2022   PLT 255 11/29/2022   Physical Exam:   VS:  BP 132/82 (BP Location: Left Arm, Patient Position: Sitting, Cuff Size: Normal)   Pulse 89   Resp 16   Ht 5\' 3"  (1.6 m)   Wt 204 lb (92.5 kg)   SpO2 92%   BMI 36.14 kg/m    Wt Readings from Last 3 Encounters:  01/25/23 204 lb (92.5 kg)  01/13/23 202 lb 3.2 oz (91.7 kg)  12/22/22 201 lb (91.2 kg)     Physical Exam Vitals reviewed.  Constitutional:      Appearance: She is well-developed. She is obese.  Neck:     Vascular: Carotid bruit present. No JVD.  Cardiovascular:     Rate and Rhythm: Normal rate and regular rhythm.     Pulses: Intact distal pulses.     Heart sounds: S1 normal and S2 normal. Murmur heard.     Harsh midsystolic murmur is present with a grade of 3/6 at the upper right sternal border radiating to the apex.     No gallop.  Pulmonary:     Effort: Pulmonary effort is normal. No accessory muscle usage.     Breath sounds: Rales (bases) present.  Abdominal:     General: Bowel sounds are normal.     Palpations: Abdomen is soft.  Musculoskeletal:     Right lower leg: Edema (trace) present.     Left lower leg: Edema (trace) present.     Studies Reviewed: Marland Kitchen     EKG:        EKG 12/22/2022: Normal sinus rhythm at the rate of 71 bpm, left atrial enlargement, poor R wave progression, probably normal variant. Otherwise normal EKG. Compared to 09/08/2022, first-degree AV block not present.   TEE 06/23/2022:  1. Left ventricular ejection fraction, by estimation, is 60 to 65%. The left ventricle has normal function. The left ventricle has no regional wall motion abnormalities.  2. Right ventricular systolic function is normal. The right ventricular size is normal.  3. Left atrial size was mild to moderately dilated. No left atrial/left atrial appendage thrombus was detected.  4. The mitral valve was not assessed. No evidence of mitral valve regurgitation.  5. The aortic valve is calcified. Aortic valve regurgitation is trivial. Moderate aortic valve stenosis.  Peak velocity 3 m/s. Surface probe used: Vmax 3.03 m/s. Vmean 2.21 m/s. Max peak gradient 37 mmHg. Mean gradient 22 mmHg. VTI 71.7  Echocardiogram 01/20/2023: 1. Left ventricular ejection fraction, by estimation, is 60 to 65%. The left ventricle has normal function. The left ventricle has no regional wall motion abnormalities. There is moderate concentric left ventricular hypertrophy. Left ventricular diastolic parameters are indeterminate.  2. Right ventricular systolic function is normal. The right ventricular size is normal.  3. Left atrial size was moderately dilated.  4. The mitral valve is degenerative. Mild mitral valve regurgitation. No evidence of mitral stenosis.  5. The aortic valve is tricuspid. There is severe calcifcation of the aortic valve. Aortic valve regurgitation is trivial. Moderate to severe aortic valve stenosis. Aortic valve area, by VTI measures 0.95 cm. Aortic valve mean gradient measures 19.5 mmHg. Aortic valve Vmax measures 2.96 m/s.  6. The inferior vena cava is normal in size with greater than 50% respiratory variability, suggesting right atrial pressure of 3  mmHg  Extended out patient EKG monitoring 11 days starting 12/29/2022:   Predominant Rhythm : Normal sinus rhythm. Min HR: 45 bpm at 1:52 AM. Max HR 111 bpm at 8:06 AM, average heart rate 80 bpm.   Atrial arrhythmias: There were 30 episodes of brief atrial tachycardia longest lasting 18 seconds.  There were occasional PACs, atrial couplets and triplets.   Atrial fibrillation: None   Ventricular arrhythmias: Frequent PVCs noted, occasional ventricular couplets and triplets.  Ventricular bigeminy and trigeminy was present. PVC Burden 30.1 (3,900,14)   Heart Block: None   Symptoms: The report patient triggered events correlating with ventricular bigeminy and trigeminy and ventricular ectopics.   ASSESSMENT AND PLAN: .      ICD-10-CM   1. Severe aortic stenosis  I35.0 Ambulatory referral to Structural Heart/Valve Clinic (only at [facility])    CBC    2. Chronic  diastolic (congestive) heart failure (HCC)  I50.32 Ambulatory referral to Structural Heart/Valve Clinic (only at [facility])    CBC    Basic metabolic panel    Pro b natriuretic peptide (BNP)    3. Dyspnea on exertion  R06.09 CBC    4. DNR (do not resuscitate)  Z66      CHA2DS2-VASc Score = 6 [CHF History: 1, HTN History: 1, Diabetes History: 0, Stroke History: 0, Vascular Disease History: 1, Age Score: 2, Gender Score: 1].  Therefore, the patient's annual risk of stroke is 9.7 %.      Assessment and Plan    Severe Aortic Stenosis Fatigue and mild dyspnea on exertion. Echocardiogram shows severe aortic stenosis. Discussed the risks/benefits of aortic valve replacement to improve quality of life and potentially reduce diuretic requirement. Patient requested 24 hours to discuss with family. -Schedule right and left heart catheterization to assess for coronary artery disease prior to valve replacement. -Refer to Dr. Excell Seltzer for further management of aortic stenosis.  Bradycardia Concerns of low heart rate and associated  symptoms. Extended EKG monitoring showed no episodes of heart block, but presence of extra heartbeats. No need for pacemaker. Bradycardia was [place] due to PVCs and PACs.  Chronic diastolic heart failure Patient is presently very well compensated after long time of recurrent episodes of heart failure.  She is in better shape and form to proceed with left and right heart catheterization and possible consideration for TAVR.  Referral to structural heart team made.  Extensive discussion with the patient and with her daughter present at the bedside, regarding conservative management, palliative care and eventual hospice care in view of advanced age and chronic medical conditions.  This includes but not limited to chronic diastolic heart failure with acute exacerbations frequently, gradual decline in functional status, presently in class III dyspnea, chronic stage IIIa kidney disease and need for long-term anticoagulation.  In spite of this, patient is very independent, mentally very alert and cognizant, still feels she can live independently and has some quality of life.  Patient does have living will and she is a DNR.  I discussed with the patient that we could either continue aggressive evaluation and management of aortic stenosis and in the hopes of improving her diastolic compliance and improving her heart failure symptoms or consider palliation and eventual hospice care.  For now I will set her up for right and left heart catheterization referral made to TAVR team and if she decides not to go and proceed with the procedures, we could certainly cancel this.  Will see her back in 3 months for follow-up.  This is a 40-minute office visit encounter in discussions regarding end-of-life, discussion regarding cardiac catheterization and complications, discussions regarding TAVR and options of medical management.  [name] is present and all questions answered.  Patient is aware to hold Eliquis  for 24 hours prior to right and left heart catheterization.  Signed,  Yates Decamp, MD, [facility] 01/25/2023, 8:55 PM [facility] [address] [place], [place] [address] Phone: [phone]. Fax:  [phone]

## 2023-01-26 DIAGNOSIS — I5043 Acute on chronic combined systolic (congestive) and diastolic (congestive) heart failure: Secondary | ICD-10-CM | POA: Diagnosis not present

## 2023-01-26 DIAGNOSIS — N1832 Chronic kidney disease, stage 3b: Secondary | ICD-10-CM | POA: Diagnosis not present

## 2023-01-26 DIAGNOSIS — J9601 Acute respiratory failure with hypoxia: Secondary | ICD-10-CM | POA: Diagnosis not present

## 2023-01-26 DIAGNOSIS — I480 Paroxysmal atrial fibrillation: Secondary | ICD-10-CM | POA: Diagnosis not present

## 2023-01-26 DIAGNOSIS — D631 Anemia in chronic kidney disease: Secondary | ICD-10-CM | POA: Diagnosis not present

## 2023-01-26 DIAGNOSIS — I130 Hypertensive heart and chronic kidney disease with heart failure and stage 1 through stage 4 chronic kidney disease, or unspecified chronic kidney disease: Secondary | ICD-10-CM | POA: Diagnosis not present

## 2023-01-26 NOTE — Progress Notes (Signed)
Labs stable for heart cath

## 2023-01-27 LAB — CBC
Hematocrit: 39.2 % (ref 34.0–46.6)
Hemoglobin: 13 g/dL (ref 11.1–15.9)
MCH: 31.2 pg (ref 26.6–33.0)
MCHC: 33.2 g/dL (ref 31.5–35.7)
MCV: 94 fL (ref 79–97)
Platelets: 279 10*3/uL (ref 150–450)
RBC: 4.17 x10E6/uL (ref 3.77–5.28)
RDW: 13.2 % (ref 11.7–15.4)
WBC: 8.6 10*3/uL (ref 3.4–10.8)

## 2023-01-27 LAB — BASIC METABOLIC PANEL
BUN/Creatinine Ratio: 36 — ABNORMAL HIGH (ref 12–28)
BUN: 29 mg/dL — ABNORMAL HIGH (ref 8–27)
CO2: 25 mmol/L (ref 20–29)
Calcium: 8.9 mg/dL (ref 8.7–10.3)
Chloride: 104 mmol/L (ref 96–106)
Creatinine, Ser: 0.8 mg/dL (ref 0.57–1.00)
Glucose: 97 mg/dL (ref 70–99)
Potassium: 4.8 mmol/L (ref 3.5–5.2)
Sodium: 142 mmol/L (ref 134–144)
eGFR: 71 mL/min/{1.73_m2} (ref >59)

## 2023-01-27 LAB — PRO B NATRIURETIC PEPTIDE: NT-Pro BNP: 1478 pg/mL — ABNORMAL HIGH (ref 0–738)

## 2023-01-30 ENCOUNTER — Telehealth: Payer: Self-pay | Admitting: Cardiology

## 2023-01-30 NOTE — Telephone Encounter (Signed)
Daughter calling in to get assistant with filling out the forms, for patient assistant with medication. Please advise

## 2023-01-31 ENCOUNTER — Encounter: Payer: Self-pay | Admitting: Cardiology

## 2023-01-31 ENCOUNTER — Telehealth: Payer: Self-pay | Admitting: Cardiology

## 2023-01-31 DIAGNOSIS — I480 Paroxysmal atrial fibrillation: Secondary | ICD-10-CM | POA: Diagnosis not present

## 2023-01-31 DIAGNOSIS — J9601 Acute respiratory failure with hypoxia: Secondary | ICD-10-CM | POA: Diagnosis not present

## 2023-01-31 DIAGNOSIS — I5043 Acute on chronic combined systolic (congestive) and diastolic (congestive) heart failure: Secondary | ICD-10-CM | POA: Diagnosis not present

## 2023-01-31 DIAGNOSIS — D631 Anemia in chronic kidney disease: Secondary | ICD-10-CM | POA: Diagnosis not present

## 2023-01-31 DIAGNOSIS — I130 Hypertensive heart and chronic kidney disease with heart failure and stage 1 through stage 4 chronic kidney disease, or unspecified chronic kidney disease: Secondary | ICD-10-CM | POA: Diagnosis not present

## 2023-01-31 DIAGNOSIS — N1832 Chronic kidney disease, stage 3b: Secondary | ICD-10-CM | POA: Diagnosis not present

## 2023-01-31 NOTE — Telephone Encounter (Signed)
Call to patient and she would like a call from the nurse who worked with Fr Ganji at her last visit. She states she was supposed to receive call from her regarding getting her angiogram scheduled and have not received call yet.

## 2023-01-31 NOTE — Telephone Encounter (Signed)
Patient is calling stating she spoke with Harriett Sine in our office about scheduling an angiogram with Dr. Jacinto Halim. She is requesting a callback from her to set this up.  She is also requesting an update regarding the patient assistance for her medication that her daughter called about yesterday due to her not hearing back.  Please advise.

## 2023-01-31 NOTE — Telephone Encounter (Signed)
Spoke with patient. She has an appt with Dr. Excell Seltzer for TAVR consult on 02/23/23 and wishes to schedule R/L heart cath with Dr. Jacinto Halim after this appt.  Offered next available date for 03/02/23, patient accepted.  Scheduled right and left heart cath with Dr. Jacinto Halim for 03/02/23 at 9:00 AM. Will send instruction letter to patient via MyChart and in the mail (as requested by patient, mailing address verified).  Patient verbalized understanding and expressed appreciation for call.

## 2023-02-02 ENCOUNTER — Other Ambulatory Visit: Payer: Self-pay

## 2023-02-02 ENCOUNTER — Encounter (HOSPITAL_COMMUNITY): Payer: Self-pay | Admitting: Emergency Medicine

## 2023-02-02 ENCOUNTER — Emergency Department (HOSPITAL_COMMUNITY): Payer: Medicare Other

## 2023-02-02 ENCOUNTER — Inpatient Hospital Stay (HOSPITAL_COMMUNITY)
Admission: EM | Admit: 2023-02-02 | Discharge: 2023-02-07 | DRG: 481 | Disposition: A | Discharge status: Skilled Nursing Facility | Payer: Medicare Other | Attending: Family Medicine | Admitting: Family Medicine

## 2023-02-02 DIAGNOSIS — J410 Simple chronic bronchitis: Secondary | ICD-10-CM

## 2023-02-02 DIAGNOSIS — Z79890 Hormone replacement therapy (postmenopausal): Secondary | ICD-10-CM | POA: Diagnosis not present

## 2023-02-02 DIAGNOSIS — I350 Nonrheumatic aortic (valve) stenosis: Secondary | ICD-10-CM

## 2023-02-02 DIAGNOSIS — Z7951 Long term (current) use of inhaled steroids: Secondary | ICD-10-CM

## 2023-02-02 DIAGNOSIS — E669 Obesity, unspecified: Secondary | ICD-10-CM | POA: Diagnosis not present

## 2023-02-02 DIAGNOSIS — Z801 Family history of malignant neoplasm of trachea, bronchus and lung: Secondary | ICD-10-CM | POA: Diagnosis not present

## 2023-02-02 DIAGNOSIS — N1832 Chronic kidney disease, stage 3b: Secondary | ICD-10-CM | POA: Diagnosis present

## 2023-02-02 DIAGNOSIS — I480 Paroxysmal atrial fibrillation: Secondary | ICD-10-CM | POA: Diagnosis not present

## 2023-02-02 DIAGNOSIS — Z6837 Body mass index (BMI) 37.0-37.9, adult: Secondary | ICD-10-CM

## 2023-02-02 DIAGNOSIS — R001 Bradycardia, unspecified: Secondary | ICD-10-CM | POA: Diagnosis not present

## 2023-02-02 DIAGNOSIS — R058 Other specified cough: Secondary | ICD-10-CM | POA: Diagnosis not present

## 2023-02-02 DIAGNOSIS — F32A Depression, unspecified: Secondary | ICD-10-CM | POA: Diagnosis present

## 2023-02-02 DIAGNOSIS — Y92009 Unspecified place in unspecified non-institutional (private) residence as the place of occurrence of the external cause: Secondary | ICD-10-CM

## 2023-02-02 DIAGNOSIS — I10 Essential (primary) hypertension: Secondary | ICD-10-CM | POA: Diagnosis present

## 2023-02-02 DIAGNOSIS — I4819 Other persistent atrial fibrillation: Secondary | ICD-10-CM | POA: Diagnosis not present

## 2023-02-02 DIAGNOSIS — Z7901 Long term (current) use of anticoagulants: Secondary | ICD-10-CM | POA: Diagnosis not present

## 2023-02-02 DIAGNOSIS — I700 Atherosclerosis of aorta: Secondary | ICD-10-CM | POA: Diagnosis not present

## 2023-02-02 DIAGNOSIS — Z8679 Personal history of other diseases of the circulatory system: Secondary | ICD-10-CM

## 2023-02-02 DIAGNOSIS — Z87891 Personal history of nicotine dependence: Secondary | ICD-10-CM

## 2023-02-02 DIAGNOSIS — F411 Generalized anxiety disorder: Secondary | ICD-10-CM | POA: Diagnosis not present

## 2023-02-02 DIAGNOSIS — S72352D Displaced comminuted fracture of shaft of left femur, subsequent encounter for closed fracture with routine healing: Secondary | ICD-10-CM | POA: Diagnosis not present

## 2023-02-02 DIAGNOSIS — I110 Hypertensive heart disease with heart failure: Secondary | ICD-10-CM | POA: Diagnosis not present

## 2023-02-02 DIAGNOSIS — S72002A Fracture of unspecified part of neck of left femur, initial encounter for closed fracture: Secondary | ICD-10-CM | POA: Diagnosis not present

## 2023-02-02 DIAGNOSIS — M069 Rheumatoid arthritis, unspecified: Secondary | ICD-10-CM | POA: Diagnosis present

## 2023-02-02 DIAGNOSIS — Z7401 Bed confinement status: Secondary | ICD-10-CM | POA: Diagnosis not present

## 2023-02-02 DIAGNOSIS — R2689 Other abnormalities of gait and mobility: Secondary | ICD-10-CM | POA: Diagnosis not present

## 2023-02-02 DIAGNOSIS — S72142A Displaced intertrochanteric fracture of left femur, initial encounter for closed fracture: Secondary | ICD-10-CM | POA: Diagnosis present

## 2023-02-02 DIAGNOSIS — Z8249 Family history of ischemic heart disease and other diseases of the circulatory system: Secondary | ICD-10-CM | POA: Diagnosis not present

## 2023-02-02 DIAGNOSIS — E038 Other specified hypothyroidism: Secondary | ICD-10-CM

## 2023-02-02 DIAGNOSIS — I358 Other nonrheumatic aortic valve disorders: Secondary | ICD-10-CM | POA: Diagnosis present

## 2023-02-02 DIAGNOSIS — I130 Hypertensive heart and chronic kidney disease with heart failure and stage 1 through stage 4 chronic kidney disease, or unspecified chronic kidney disease: Secondary | ICD-10-CM | POA: Diagnosis present

## 2023-02-02 DIAGNOSIS — G4733 Obstructive sleep apnea (adult) (pediatric): Secondary | ICD-10-CM | POA: Diagnosis present

## 2023-02-02 DIAGNOSIS — Z23 Encounter for immunization: Secondary | ICD-10-CM

## 2023-02-02 DIAGNOSIS — J449 Chronic obstructive pulmonary disease, unspecified: Secondary | ICD-10-CM | POA: Diagnosis present

## 2023-02-02 DIAGNOSIS — I352 Nonrheumatic aortic (valve) stenosis with insufficiency: Secondary | ICD-10-CM | POA: Diagnosis present

## 2023-02-02 DIAGNOSIS — R9431 Abnormal electrocardiogram [ECG] [EKG]: Secondary | ICD-10-CM | POA: Diagnosis not present

## 2023-02-02 DIAGNOSIS — R531 Weakness: Secondary | ICD-10-CM | POA: Diagnosis not present

## 2023-02-02 DIAGNOSIS — I6523 Occlusion and stenosis of bilateral carotid arteries: Secondary | ICD-10-CM | POA: Diagnosis not present

## 2023-02-02 DIAGNOSIS — W010XXA Fall on same level from slipping, tripping and stumbling without subsequent striking against object, initial encounter: Secondary | ICD-10-CM | POA: Diagnosis present

## 2023-02-02 DIAGNOSIS — Z90710 Acquired absence of both cervix and uterus: Secondary | ICD-10-CM

## 2023-02-02 DIAGNOSIS — Z888 Allergy status to other drugs, medicaments and biological substances status: Secondary | ICD-10-CM

## 2023-02-02 DIAGNOSIS — M129 Arthropathy, unspecified: Secondary | ICD-10-CM | POA: Diagnosis not present

## 2023-02-02 DIAGNOSIS — E039 Hypothyroidism, unspecified: Secondary | ICD-10-CM | POA: Diagnosis present

## 2023-02-02 DIAGNOSIS — Z9889 Other specified postprocedural states: Secondary | ICD-10-CM

## 2023-02-02 DIAGNOSIS — S0990XA Unspecified injury of head, initial encounter: Secondary | ICD-10-CM | POA: Diagnosis not present

## 2023-02-02 DIAGNOSIS — E875 Hyperkalemia: Secondary | ICD-10-CM | POA: Diagnosis not present

## 2023-02-02 DIAGNOSIS — R739 Hyperglycemia, unspecified: Secondary | ICD-10-CM | POA: Diagnosis present

## 2023-02-02 DIAGNOSIS — I499 Cardiac arrhythmia, unspecified: Secondary | ICD-10-CM | POA: Diagnosis not present

## 2023-02-02 DIAGNOSIS — I517 Cardiomegaly: Secondary | ICD-10-CM | POA: Diagnosis not present

## 2023-02-02 DIAGNOSIS — Z79899 Other long term (current) drug therapy: Secondary | ICD-10-CM

## 2023-02-02 DIAGNOSIS — Z825 Family history of asthma and other chronic lower respiratory diseases: Secondary | ICD-10-CM

## 2023-02-02 DIAGNOSIS — I5032 Chronic diastolic (congestive) heart failure: Secondary | ICD-10-CM | POA: Diagnosis not present

## 2023-02-02 DIAGNOSIS — K219 Gastro-esophageal reflux disease without esophagitis: Secondary | ICD-10-CM | POA: Diagnosis not present

## 2023-02-02 DIAGNOSIS — M25552 Pain in left hip: Secondary | ICD-10-CM | POA: Diagnosis not present

## 2023-02-02 DIAGNOSIS — I509 Heart failure, unspecified: Secondary | ICD-10-CM | POA: Diagnosis not present

## 2023-02-02 DIAGNOSIS — R278 Other lack of coordination: Secondary | ICD-10-CM | POA: Diagnosis not present

## 2023-02-02 DIAGNOSIS — I513 Intracardiac thrombosis, not elsewhere classified: Secondary | ICD-10-CM | POA: Diagnosis present

## 2023-02-02 DIAGNOSIS — R0902 Hypoxemia: Secondary | ICD-10-CM | POA: Diagnosis not present

## 2023-02-02 DIAGNOSIS — G2581 Restless legs syndrome: Secondary | ICD-10-CM | POA: Diagnosis not present

## 2023-02-02 DIAGNOSIS — K5900 Constipation, unspecified: Secondary | ICD-10-CM | POA: Diagnosis not present

## 2023-02-02 DIAGNOSIS — I959 Hypotension, unspecified: Secondary | ICD-10-CM | POA: Diagnosis not present

## 2023-02-02 DIAGNOSIS — I4891 Unspecified atrial fibrillation: Secondary | ICD-10-CM | POA: Diagnosis not present

## 2023-02-02 DIAGNOSIS — R008 Other abnormalities of heart beat: Secondary | ICD-10-CM | POA: Diagnosis present

## 2023-02-02 DIAGNOSIS — I714 Abdominal aortic aneurysm, without rupture, unspecified: Secondary | ICD-10-CM | POA: Diagnosis not present

## 2023-02-02 DIAGNOSIS — T56824D Toxic effect of gadolinium, undetermined, subsequent encounter: Secondary | ICD-10-CM

## 2023-02-02 DIAGNOSIS — Z803 Family history of malignant neoplasm of breast: Secondary | ICD-10-CM | POA: Diagnosis not present

## 2023-02-02 DIAGNOSIS — M6281 Muscle weakness (generalized): Secondary | ICD-10-CM | POA: Diagnosis not present

## 2023-02-02 DIAGNOSIS — J40 Bronchitis, not specified as acute or chronic: Secondary | ICD-10-CM | POA: Diagnosis not present

## 2023-02-02 DIAGNOSIS — H409 Unspecified glaucoma: Secondary | ICD-10-CM | POA: Diagnosis not present

## 2023-02-02 DIAGNOSIS — R41841 Cognitive communication deficit: Secondary | ICD-10-CM | POA: Diagnosis not present

## 2023-02-02 LAB — BASIC METABOLIC PANEL
Anion gap: 12 (ref 5–15)
BUN: 27 mg/dL — ABNORMAL HIGH (ref 8–23)
CO2: 26 mmol/L (ref 22–32)
Calcium: 9 mg/dL (ref 8.9–10.3)
Chloride: 101 mmol/L (ref 98–111)
Creatinine, Ser: 0.98 mg/dL (ref 0.44–1.00)
GFR, Estimated: 56 mL/min — ABNORMAL LOW (ref >60)
Glucose, Bld: 102 mg/dL — ABNORMAL HIGH (ref 70–99)
Potassium: 4.4 mmol/L (ref 3.5–5.1)
Sodium: 139 mmol/L (ref 135–145)

## 2023-02-02 LAB — CBC WITH DIFFERENTIAL/PLATELET
Abs Immature Granulocytes: 0.05 10*3/uL (ref 0.00–0.07)
Basophils Absolute: 0 10*3/uL (ref 0.0–0.1)
Basophils Relative: 0 %
Eosinophils Absolute: 0.1 10*3/uL (ref 0.0–0.5)
Eosinophils Relative: 1 %
HCT: 43.2 % (ref 36.0–46.0)
Hemoglobin: 13.7 g/dL (ref 12.0–15.0)
Immature Granulocytes: 1 %
Lymphocytes Relative: 10 %
Lymphs Abs: 0.9 10*3/uL (ref 0.7–4.0)
MCH: 30.2 pg (ref 26.0–34.0)
MCHC: 31.7 g/dL (ref 30.0–36.0)
MCV: 95.4 fL (ref 80.0–100.0)
Monocytes Absolute: 1 10*3/uL (ref 0.1–1.0)
Monocytes Relative: 11 %
Neutro Abs: 7.1 10*3/uL (ref 1.7–7.7)
Neutrophils Relative %: 77 %
Platelets: 272 10*3/uL (ref 150–400)
RBC: 4.53 MIL/uL (ref 3.87–5.11)
RDW: 14.2 % (ref 11.5–15.5)
WBC: 9.2 10*3/uL (ref 4.0–10.5)
nRBC: 0 % (ref 0.0–0.2)

## 2023-02-02 LAB — PROTIME-INR
INR: 1.2 (ref 0.8–1.2)
Prothrombin Time: 15.8 s — ABNORMAL HIGH (ref 11.4–15.2)

## 2023-02-02 LAB — TYPE AND SCREEN
ABO/RH(D): B POS
Antibody Screen: NEGATIVE

## 2023-02-02 MED ORDER — SPIRONOLACTONE 25 MG PO TABS
25.0000 mg | ORAL_TABLET | Freq: Every day | ORAL | Status: DC
  Administered 2023-02-03 – 2023-02-07 (×4): 25 mg via ORAL
  Filled 2023-02-02: qty 2
  Filled 2023-02-02 (×3): qty 1

## 2023-02-02 MED ORDER — MORPHINE SULFATE (PF) 2 MG/ML IV SOLN: 1.0000 mg | INTRAVENOUS | Status: DC | PRN

## 2023-02-02 MED ORDER — ONDANSETRON HCL 4 MG PO TABS: 4.0000 mg | ORAL_TABLET | Freq: Four times a day (QID) | ORAL | Status: DC | PRN

## 2023-02-02 MED ORDER — ACETAMINOPHEN 650 MG RE SUPP: 650.0000 mg | Freq: Four times a day (QID) | RECTAL | Status: DC | PRN

## 2023-02-02 MED ORDER — ROPINIROLE HCL 1 MG PO TABS: 1.0000 mg | ORAL_TABLET | ORAL | Status: DC

## 2023-02-02 MED ORDER — SACUBITRIL-VALSARTAN 24-26 MG PO TABS
1.0000 | ORAL_TABLET | Freq: Two times a day (BID) | ORAL | Status: DC
  Administered 2023-02-03 – 2023-02-07 (×8): 1 via ORAL
  Filled 2023-02-02 (×9): qty 1

## 2023-02-02 MED ORDER — BUDESONIDE 0.5 MG/2ML IN SUSP: 0.5000 mg | Freq: Two times a day (BID) | RESPIRATORY_TRACT | Status: DC

## 2023-02-02 MED ORDER — LEVOTHYROXINE SODIUM 25 MCG PO TABS
125.0000 ug | ORAL_TABLET | Freq: Every day | ORAL | Status: DC
  Administered 2023-02-03 – 2023-02-07 (×5): 125 ug via ORAL
  Filled 2023-02-02 (×5): qty 1

## 2023-02-02 MED ORDER — NORTRIPTYLINE HCL 10 MG PO CAPS
10.0000 mg | ORAL_CAPSULE | Freq: Every day | ORAL | Status: DC
  Administered 2023-02-03 – 2023-02-06 (×5): 10 mg via ORAL
  Filled 2023-02-02 (×5): qty 1

## 2023-02-02 MED ORDER — SODIUM CHLORIDE 0.9% FLUSH
3.0000 mL | Freq: Two times a day (BID) | INTRAVENOUS | Status: DC
  Administered 2023-02-04 – 2023-02-07 (×7): 3 mL via INTRAVENOUS

## 2023-02-02 MED ORDER — BUSPIRONE HCL 15 MG PO TABS: 7.5000 mg | ORAL_TABLET | ORAL | Status: DC

## 2023-02-02 MED ORDER — FENTANYL CITRATE PF 50 MCG/ML IJ SOSY
50.0000 ug | PREFILLED_SYRINGE | INTRAMUSCULAR | Status: AC | PRN
  Administered 2023-02-02 (×2): 50 ug via INTRAVENOUS
  Filled 2023-02-02 (×2): qty 1

## 2023-02-02 MED ORDER — SENNOSIDES-DOCUSATE SODIUM 8.6-50 MG PO TABS: 1.0000 | ORAL_TABLET | Freq: Every evening | ORAL | Status: DC | PRN

## 2023-02-02 MED ORDER — ONDANSETRON HCL 4 MG/2ML IJ SOLN: 4.0000 mg | Freq: Four times a day (QID) | INTRAMUSCULAR | Status: DC | PRN

## 2023-02-02 MED ORDER — SODIUM CHLORIDE 0.9 % IV SOLN: 250.0000 mL | INTRAVENOUS | Status: AC | PRN

## 2023-02-02 MED ORDER — MORPHINE SULFATE (PF) 2 MG/ML IV SOLN
2.0000 mg | INTRAVENOUS | Status: DC | PRN
  Administered 2023-02-03: 2 mg via INTRAVENOUS
  Filled 2023-02-02: qty 1

## 2023-02-02 MED ORDER — SODIUM CHLORIDE 0.9% FLUSH: 3.0000 mL | INTRAVENOUS | Status: DC | PRN

## 2023-02-02 MED ORDER — ACETAMINOPHEN 325 MG PO TABS
650.0000 mg | ORAL_TABLET | Freq: Four times a day (QID) | ORAL | Status: DC | PRN
  Administered 2023-02-03 – 2023-02-06 (×2): 650 mg via ORAL
  Filled 2023-02-02 (×3): qty 2

## 2023-02-02 MED ORDER — BUDESONIDE 0.5 MG/2ML IN SUSP
0.5000 mg | Freq: Two times a day (BID) | RESPIRATORY_TRACT | Status: DC
  Administered 2023-02-03 – 2023-02-06 (×7): 0.5 mg via RESPIRATORY_TRACT
  Filled 2023-02-02 (×8): qty 2

## 2023-02-02 MED ORDER — LABETALOL HCL 5 MG/ML IV SOLN: 10.0000 mg | Freq: Three times a day (TID) | INTRAVENOUS | Status: DC | PRN

## 2023-02-02 MED ORDER — MELATONIN 3 MG PO TABS
3.0000 mg | ORAL_TABLET | Freq: Every day | ORAL | Status: DC
  Administered 2023-02-03 – 2023-02-06 (×5): 3 mg via ORAL
  Filled 2023-02-02 (×5): qty 1

## 2023-02-02 MED ORDER — DAPAGLIFLOZIN PROPANEDIOL 10 MG PO TABS: 10.0000 mg | ORAL_TABLET | Freq: Every day | ORAL | Status: DC

## 2023-02-02 MED ORDER — DEXTROSE IN LACTATED RINGERS 5 % IV SOLN: INTRAVENOUS | Status: AC

## 2023-02-02 MED ORDER — FUROSEMIDE 20 MG PO TABS: 20.0000 mg | ORAL_TABLET | Freq: Every day | ORAL | Status: DC

## 2023-02-02 NOTE — ED Triage Notes (Signed)
Pt in from home after trip fall, landing backwards, takes Eliquis - denies any head strike or LOC. Pt presents with L hip pain, has L leg shortening and outward rotation. Pt given Fentanyl en route

## 2023-02-02 NOTE — H&P (Addendum)
History and Physical    Lisa Mcclure:[id number] DOB: 1932-12-17 DOA: 02/02/2023  PCP: Garlan Fillers, MD   Patient coming from: Home   Chief Complaint:  Chief Complaint  Patient presents with   Fall   Hip Pain   ED TRIAGE note:Pt in from home after trip fall, landing backwards, takes Eliquis - denies any head strike or LOC. Pt presents with L hip pain, has L leg shortening and outward rotation. Pt given Fentanyl en route   HPI:  Lisa Mcclure is a 89 y.o. female with medical history significant of heart failure with preserved EF, essential hypertension, paroxysmal atrial fibrillation, COPD, OSA, severe aortic stenosis, status post AAA repair in 12/2019 and pseudoaneurysm sac repair of AAA on 10/29/2022, left atrial thrombus on Eliquis, hypothyroidism and generalized anxiety disorder presented to emergency department for evaluation for fall.  Patient denies any loss of consciousness.  Since the fall patient has left-sided hip pain, left-sided leg shortening and unable to move the leg. Patient reported that she is stumbled backward after losing her balance with her walker and landing on the left-sided hip.  Patient denies any hitting of her head and loss of consciousness.  During my evaluation patient is complaining about 10 out of 10 left-sided hip pain.  She is also complaining about feeling cold.   Patient denies any headache, chest pain, palpitation, shortness of breath, nausea, abdominal pain, cough with patient, diarrhea and vomiting.   Per chart review patient has been seen by cardiology on 01/25/2023 for management of severe aortic stenosis.  Plan for heart catheterization with TAVR 03/02/2023.   ED Course:  Presentation to ED patient is hemodynamically stable. BMP grossly unremarkable. CBC grossly unremarkable. Elevated pro time 15.8.  X-ray of the left hip showing: Comminuted displaced intertrochanteric left proximal femur fracture.   Chest x-ray  chronic cardiomegaly and interstitial coarsening.  No acute chest finding.  ED provider consulted orthopedic surgeon Dr. Ella Jubilee who will evaluate patient in the morning and possible surgery?  Thinks that patient needed surgery in that case patient need cardiology clearance if before surgical procedure as patient has severe aortic stenosis.  Hospitalist has been contacted for further evaluation management.  I have paged and consulted cardiology Dr. Florina Ou who spoke with orthopedics on-call and per their discussion once orthopedic will see patient in the morning and make the final decision for surgery in that case have to consult cardiology formally for surgical clearance.    Review of Systems:  Review of Systems  Constitutional:  Negative for chills, fever, malaise/fatigue and weight loss.  HENT:  Negative for hearing loss and tinnitus.   Eyes:  Negative for blurred vision and double vision.  Respiratory:  Negative for cough, sputum production and shortness of breath.   Cardiovascular:  Negative for chest pain, orthopnea, claudication and leg swelling.  Gastrointestinal:  Negative for heartburn and nausea.  Genitourinary:  Negative for dysuria and urgency.  Musculoskeletal:  Positive for falls. Negative for back pain, joint pain, myalgias and neck pain.       Left-sided hip pain 9 out of 10 intensity  Neurological:  Negative for dizziness, tingling, loss of consciousness and headaches.  Psychiatric/Behavioral:  The patient is not nervous/anxious.     Past Medical History:  Diagnosis Date   Acute renal failure superimposed on stage 3b chronic kidney disease (HCC) 06/15/2021   Anemia    h/o hemorrhoidal bleeding and blood transfusion   Chronic diastolic CHF (congestive heart failure) (HCC)  Depression    Diverticulosis    DOE (dyspnea on exertion)    Esophageal reflux    GERD (gastroesophageal reflux disease)    Hypothyroidism    Insomnia    LBP (low back pain)    OAB  (overactive bladder)    Obesity    OSA (obstructive sleep apnea)    Osteoarthritis    Paroxysmal atrial fibrillation (HCC)    Rheumatoid arthritis(714.0)    Shoulder pain, bilateral    Unspecified essential hypertension    Venous insufficiency     Past Surgical History:  Procedure Laterality Date   ABDOMINAL AORTIC ENDOVASCULAR STENT GRAFT N/A 12/20/2019   Procedure: Aortogram including catheter selection of aorta and bilateral iliac arteriogram, Endovascular repair of infrarenal abdominal aortic aneurysm with bifurcated stent graft (26 mm x 14 x 12 main body, right bell bottom with a 20 mm x 10 cm piece, and left bell bottom with a 16 mm x 12 cm piece) ;  Surgeon: Cephus Shelling, MD;  Location: [facility] OR;  Service: Vascular;  Laterality: N/A;   APPENDECTOMY  1953   BIOPSY  06/18/2021   Procedure: BIOPSY;  Surgeon: Kerin Salen, MD;  Location: [facility] ENDOSCOPY;  Service: Gastroenterology;;   bladder abduction-1996  1996   breast biopsy Right 1980   CARDIOVERSION N/A 12/24/2019   Procedure: CARDIOVERSION;  Surgeon: Yates Decamp, MD;  Location: [facility] ENDOSCOPY;  Service: Cardiovascular;  Laterality: N/A;   CARDIOVERSION N/A 04/25/2022   Procedure: CARDIOVERSION;  Surgeon: Yates Decamp, MD;  Location: [facility] ENDOSCOPY;  Service: Cardiovascular;  Laterality: N/A;   CESAREAN SECTION     1957, 1961, 1964   COSMETIC SURGERY  1996   CYSTOCELE REPAIR     ESOPHAGOGASTRODUODENOSCOPY (EGD) WITH PROPOFOL N/A 06/18/2021   Procedure: ESOPHAGOGASTRODUODENOSCOPY (EGD) WITH PROPOFOL;  Surgeon: Kerin Salen, MD;  Location: [facility] ENDOSCOPY;  Service: Gastroenterology;  Laterality: N/A;   FLEXIBLE SIGMOIDOSCOPY N/A 04/10/2015   Procedure: FLEXIBLE SIGMOIDOSCOPY;  Surgeon: Willis Modena, MD;  Location: [facility] ENDOSCOPY;  Service: Endoscopy;  Laterality: N/A;   IR AORTAGRAM ABDOMINAL SERIALOGRAM  10/26/2022   IR EMBO ARTERIAL NOT HEMORR HEMANG INC GUIDE ROADMAPPING  10/24/2022   IR RADIOLOGIST EVAL & MGMT  09/13/2022   IR RADIOLOGIST  EVAL & MGMT  11/24/2022   IR US GUIDE VASC ACCESS LEFT  10/26/2022   IR US GUIDE VASC ACCESS LEFT  10/26/2022   IR US GUIDE VASC ACCESS LEFT  10/26/2022   IR US GUIDE VASC ACCESS LEFT  10/26/2022   knee arthroscopy Right 1996, 2010   KNEE ARTHROSCOPY W/ AUTOGENOUS CARTILAGE IMPLANTATION (ACI) PROCEDURE Left 1994, 1995   REFRACTIVE SURGERY  01/2020   SHOULDER SURGERY  1990   TEE WITHOUT CARDIOVERSION N/A 12/24/2019   Procedure: TRANSESOPHAGEAL ECHOCARDIOGRAM (TEE);  Surgeon: Yates Decamp, MD;  Location: [facility] ENDOSCOPY;  Service: Cardiovascular;  Laterality: N/A;   TEE WITHOUT CARDIOVERSION N/A 06/23/2022   Procedure: TRANSESOPHAGEAL ECHOCARDIOGRAM (TEE);  Surgeon: Clotilde Dieter, DO;  Location: [facility] ENDOSCOPY;  Service: Cardiovascular;  Laterality: N/A;   TOTAL ABDOMINAL HYSTERECTOMY  1972   ULTRASOUND GUIDANCE FOR VASCULAR ACCESS Bilateral 12/20/2019   Procedure: Ultrasound-guided access of bilateral common femoral arteries for delivery of endograft and percutaneous closure;  Surgeon: Cephus Shelling, MD;  Location: [facility] OR;  Service: Vascular;  Laterality: Bilateral;   VESICOVAGINAL FISTULA CLOSURE W/ TAH     WRIST SURGERY  1967     reports that she quit smoking about 38 years ago. Her smoking use included cigarettes. She started smoking  about 48 years ago. She has a 2 pack-year smoking history. She has been exposed to tobacco smoke. She has never used smokeless tobacco. She reports current alcohol use. She reports that she does not use drugs.  Allergies  Allergen Reactions   Statins Other (See Comments)    Muscle aches and INTERNAL BLEEDING   Atorvastatin Other (See Comments)    Myalgias   Gabapentin Other (See Comments)    "Made me loopy"   Methocarbamol Other (See Comments)    "Made me loopy"    Family History  Problem Relation Age of Onset   Lung cancer Mother    COPD Father    Breast cancer Maternal Aunt    Heart attack Maternal Grandmother 44   Lung cancer Son     Prior to  Admission medications   Medication Sig Start Date End Date Taking? Authorizing Provider  ALEVE ARTHRITIS PAIN 1 % GEL Apply 2 g topically 4 (four) times daily as needed (for pain).   Yes [provider]  apixaban (ELIQUIS) 2.5 MG TABS tablet Take 1 tablet (2.5 mg total) by mouth 2 (two) times daily. 02/25/22  Yes Yates Decamp, MD  budesonide (PULMICORT) 0.5 MG/2ML nebulizer solution Take 2 mLs (0.5 mg total) by nebulization in the morning and at bedtime. Patient taking differently: Take 0.5 mg by nebulization daily. 03/22/22  Yes Glenford Bayley, NP  busPIRone (BUSPAR) 7.5 MG tablet Take 7.5 mg by mouth See admin instructions. Take 7.5 mg by mouth at bedtime and an additional 7.5 mg once a day as needed for anxiety   Yes [provider]  Cholecalciferol (VITAMIN D3) 125 MCG (5000 UT) CAPS Take 1 capsule by mouth daily.   Yes [provider]  dapagliflozin propanediol (FARXIGA) 10 MG TABS tablet Take 1 tablet (10 mg total) by mouth daily before breakfast. 01/27/22  Yes Yates Decamp, MD  diclofenac sodium (VOLTAREN) 1 % GEL Apply 2.25 g topically 3 (three) times daily as needed (for pain). 10/23/18  Yes [provider]  furosemide (LASIX) 20 MG tablet Take 1 tablet (20 mg total) by mouth daily. 12/02/22  Yes Danford, Earl Lites, MD  HYDROcodone-acetaminophen (NORCO) 7.5-325 MG tablet Take 1 tablet by mouth every 6 (six) hours as needed for moderate pain. Patient taking differently: Take 1 tablet by mouth daily as needed for moderate pain (pain score 4-6). 04/13/15  Yes Vassie Loll, MD  latanoprost (XALATAN) 0.005 % ophthalmic solution Place 1 drop into both eyes at bedtime.   Yes [provider]  levothyroxine (SYNTHROID) 125 MCG tablet Take 125 mcg by mouth daily before breakfast.    Yes [provider]  melatonin 3 MG TABS tablet Take 3 mg by mouth at bedtime.   Yes [provider]  Multiple Vitamins-Minerals (PRESERVISION AREDS 2 PO)  Take 1 capsule by mouth in the morning and at bedtime.   Yes [provider]  nortriptyline (PAMELOR) 10 MG capsule Take 10 mg by mouth at bedtime. 08/31/22  Yes [provider]  rOPINIRole (REQUIP) 1 MG tablet Take 1-2 mg by mouth See admin instructions. Take 2mg  by mouth at 1 PM and 1mg  at 9 PM 06/19/15  Yes [provider]  sacubitril-valsartan (ENTRESTO) 24-26 MG Take 1 tablet by mouth 2 (two) times daily. 06/25/22  Yes Zannie Cove, MD  spironolactone (ALDACTONE) 25 MG tablet Take 25 mg by mouth daily. 11/24/22  Yes [provider]  SYSTANE COMPLETE PF 0.6 % SOLN Place 1 drop into both  eyes 4 (four) times daily as needed (for dryness).   Yes [provider]     Physical Exam: Vitals:   02/02/23 2104 02/02/23 2105 02/02/23 2106 02/02/23 2115  BP:  (!) 145/81  (!) 164/84  Pulse:  88  72  Resp:  18  18  Temp:   97.9 F (36.6 C)   TempSrc:   Oral   SpO2:  95%  100%  Weight: 90.7 kg       Physical Exam Constitutional:      General: She is not in acute distress.    Appearance: She is normal weight. She is not ill-appearing.  HENT:     Nose: Nose normal.     Mouth/Throat:     Mouth: Mucous membranes are moist.  Eyes:     Conjunctiva/sclera: Conjunctivae normal.  Cardiovascular:     Rate and Rhythm: Normal rate and regular rhythm.     Pulses: Normal pulses.     Heart sounds: Murmur heard.  Pulmonary:     Effort: Pulmonary effort is normal.     Breath sounds: Normal breath sounds.  Musculoskeletal:        General: Deformity present.     Cervical back: Neck supple.     Right lower leg: No edema.     Left lower leg: No edema.     Comments: Left-sided hip joint area of bruising, pain on superficial palpation and shortening of the left-sided leg as compared to right side. Bilateral lower extremities distal pulses are intact.  However extremities are feel cold to touch.  Skin:    Capillary Refill: Capillary refill takes less than 2  seconds.  Neurological:     Mental Status: She is oriented to person, place, and time.     Motor: No weakness.      Labs on Admission: I have personally reviewed following labs and imaging studies  CBC: Recent Labs  Lab 02/02/23 2142  WBC 9.2  NEUTROABS 7.1  HGB 13.7  HCT 43.2  MCV 95.4  PLT 272   Basic Metabolic Panel: Recent Labs  Lab 02/02/23 2142  NA 139  K 4.4  CL 101  CO2 26  GLUCOSE 102*  BUN 27*  CREATININE 0.98  CALCIUM 9.0   GFR: Estimated Creatinine Clearance: 43.2 mL/min (by C-G formula based on SCr of 0.98 mg/dL). Liver Function Tests: No results for input(s): "AST", "ALT", "ALKPHOS", "BILITOT", "PROT", "ALBUMIN" in the last 168 hours. No results for input(s): "LIPASE", "AMYLASE" in the last 168 hours. No results for input(s): "AMMONIA" in the last 168 hours. Coagulation Profile: Recent Labs  Lab 02/02/23 2142  INR 1.2   Cardiac Enzymes: No results for input(s): "CKTOTAL", "CKMB", "CKMBINDEX", "TROPONINI", "TROPONINIHS" in the last 168 hours. BNP (last 3 results) Recent Labs    04/24/22 0050 06/19/22 1720 11/29/22 1733  BNP 855.1* 1,428.4* 307.9*   HbA1C: No results for input(s): "HGBA1C" in the last 72 hours. CBG: No results for input(s): "GLUCAP" in the last 168 hours. Lipid Profile: No results for input(s): "CHOL", "HDL", "LDLCALC", "TRIG", "CHOLHDL", "LDLDIRECT" in the last 72 hours. Thyroid Function Tests: No results for input(s): "TSH", "T4TOTAL", "FREET4", "T3FREE", "THYROIDAB" in the last 72 hours. Anemia Panel: No results for input(s): "VITAMINB12", "FOLATE", "FERRITIN", "TIBC", "IRON", "RETICCTPCT" in the last 72 hours. Urine analysis:    Component Value Date/Time   COLORURINE STRAW (A) 09/13/2018 1740   APPEARANCEUR CLEAR 09/13/2018 1740   LABSPEC 1.008 09/13/2018 1740   PHURINE 7.0 09/13/2018 1740  GLUCOSEU NEGATIVE 09/13/2018 1740   HGBUR NEGATIVE 09/13/2018 1740   BILIRUBINUR NEGATIVE 09/13/2018 1740   KETONESUR  NEGATIVE 09/13/2018 1740   PROTEINUR NEGATIVE 09/13/2018 1740   UROBILINOGEN 0.2 01/06/2009 0808   NITRITE NEGATIVE 09/13/2018 1740   LEUKOCYTESUR NEGATIVE 09/13/2018 1740    Radiological Exams on Admission: I have personally reviewed images DG Chest Port 1 View  Result Date: 02/02/2023 CLINICAL DATA:  Fall with left hip fracture. EXAM: PORTABLE CHEST 1 VIEW COMPARISON:  11/29/2022 FINDINGS: Chronic cardiomegaly. Unchanged mediastinal contours. Aortic atherosclerosis. Chronic interstitial coarsening without focal airspace disease. No pleural effusion or pneumothorax. Advanced chronic shoulder arthropathy. IMPRESSION: Chronic cardiomegaly and interstitial coarsening. No acute chest findings. Electronically Signed   By: Narda Rutherford M.D.   On: 02/02/2023 21:44   DG Hip Unilat With Pelvis 2-3 Views Left  Result Date: 02/02/2023 CLINICAL DATA:  Fall with left leg pain. EXAM: DG HIP (WITH OR WITHOUT PELVIS) 2-3V LEFT COMPARISON:  None Available. FINDINGS: Comminuted displaced intertrochanteric left proximal femur fracture mild proximal migration of the femoral shaft. The femoral head is well seated. No hip dislocation. Bilateral narrowing hip osteoarthritis. Bony pelvis is intact. No rami fractures. No pubic symphyseal or sacroiliac diastasis. Aorto bi-iliac stent graft partially included in the field of view. Suspect prior bladder tack. IMPRESSION: Comminuted displaced intertrochanteric left proximal femur fracture. Electronically Signed   By: Narda Rutherford M.D.   On: 02/02/2023 21:41    EKG: My personal interpretation of EKG shows: EKG showing sinus rhythm heart rate 86 and ventricular trigeminy    Assessment/Plan: Principal Problem:   Closed left hip fracture (HCC) Active Problems:   Displaced intertrochanteric fracture of left femur (HCC)   Chronic diastolic CHF (congestive heart failure) (HCC)   Essential hypertension   Paroxysmal atrial fibrillation (HCC)   COPD (chronic  obstructive pulmonary disease) (HCC)   OSA (obstructive sleep apnea)   Hypothyroidism   Severe aortic stenosis   S/P AAA repair   Left atrial thrombus    Assessment and Plan: Displaced intertrochanteric fracture of the left femur Mechanical fall -Patient presenting with mechanical fall.  Denies any loss of consciousness - Initial workup in the ED including x-ray of the hip showing: Comminuted displaced intertrochanteric left proximal femur fracture. -ER provider consulted orthopedic surgeon Dr. Steward Drone who will see patient in the morning and will decide about surgical intervention or not.  Informed on-call cardiology Dr. Florina Ou who spoke with orthopedics on-call Dr. Steward Drone and per their discussion once orthopedic will see patient in the morning and make the final decision for surgery in that case have to consult cardiology formally for surgical clearance. -Patient has severe aortic stenosis and follows outpatient cardiology Dr. Jacinto Halim.  She has a high risk of perioperative surgical procedure. - Currently keeping patient n.p.o. for possible surgical intervention tomorrow. - Last dose of Eliquis 10/24 morning.  Holding Eliquis and starting heparin drip for possible surgical intervention tomorrow. -Continue maintenance fluid D5 LR 75 cc/h for 1 day. - Continue Tylenol as needed for mild pain and morphine 2 mg every 4 hours as needed for severe and breakthrough pain. -Will follow-up with orthopedic in the morning for further plan.  Diastolic heart failure Severe aortic stenosis S/P AAA repair and AAA pseudoaneurysm repair -Echo from 01/2023 EF 60-65 and intermittent left ventricular parameter.  Severe calcification of the aortic valve, aortic regurgitation trivial, moderate to severe aortic stenosis. -Patient has been following cardiology Dr. Jacinto Halim.  Planning for heart cath and possible TAVR 03/02/2023. -  Continue spironolactone and Farxiga.  Holding Lasix as patient is currently n.p.o.  for possible surgical intervention. -Continue labetalol as needed.  Paroxysmal atrial fibrillation -EKG showing sinus rhythm heart rate 86 and ventricular trigeminy unchanged from previous EKG August 2024. - Continue heparin drip and holding Eliquis for possible surgical intervention - Continue cardiac monitoring   Left ventricular thrombus on Eliquis -Currently Eliquis on hold.  Continue heparin drip.  Hypothyroidism -Continue levothyroxine 125 mcg daily   History of COPD -Stable - Continue Pulmicort nebulizer twice daily.  History of OSA - Not on CPAP at home. - Continue supplemental oxygen at bedtime.   DVT prophylaxis:  IV heparin gtts Code Status:  Full Code Diet: Currently n.p.o. Family Communication:   Family was present at bedside, at the time of interview.  Opportunity was given to ask question and all questions were answered satisfactorily.  Disposition Plan: Pending evaluation by orthopedics.  Pending clinical course. Consults: Orthopedic surgeon Admission status:   Inpatient, cardiac- Telemetry bed  Severity of Illness: The appropriate patient status for this patient is INPATIENT. Inpatient status is judged to be reasonable and necessary in order to provide the required intensity of service to ensure the patient's safety. The patient's presenting symptoms, physical exam findings, and initial radiographic and laboratory data in the context of their chronic comorbidities is felt to place them at high risk for further clinical deterioration. Furthermore, it is not anticipated that the patient will be medically stable for discharge from the hospital within 2 midnights of admission.   * I certify that at the point of admission it is my clinical judgment that the patient will require inpatient hospital care spanning beyond 2 midnights from the point of admission due to high intensity of service, high risk for further deterioration and high frequency of surveillance  required.Marland Kitchen    Tereasa Coop, MD [facility]  How to contact the [facility] Attending or Consulting provider 7A - 7P or covering provider during after hours 7P -7A, for this patient.  Check the care team in [facility] and look for a) attending/consulting [facility] provider listed and b) the [facility] team listed Log into [email or web address] and use [place]'s universal password to access. If you do not have the password, please contact the hospital operator. Locate the [facility] provider you are looking for under [facility] and page to a number that you can be directly reached. If you still have difficulty reaching the provider, please page the [facility] (Director on Call) for the Hospitalists listed on amion for assistance.  02/02/2023, 11:55 PM

## 2023-02-02 NOTE — Telephone Encounter (Signed)
Pt's daughter calling back for the 2nd time to f/u on getting help filling out patient assistance paperwork. Daughter would like a c/b asap about this matter.

## 2023-02-02 NOTE — ED Provider Notes (Signed)
[place] EMERGENCY DEPARTMENT AT [facility] Provider Note   CSN: [id number] Arrival date & time: 02/02/23  2100     History  Chief Complaint  Patient presents with   Fall   Hip Pain    Lisa Mcclure is a 89 y.o. female.  Brought into the emergency department by EMS after fall.  EMS reports patient stumbled backward after losing her balance with her walker landing on her left hip.  She has an obvious left hip deformity with shortening and lateral rotation.  Neurovascularly intact per EMS.  Patient was given pain medication prior to arrival.  Hypertensive with systolic blood pressure of 180.  She has a past medical history of aortic stenosis and is scheduled for catheterization and aortic valve replacement.  She is on Eliquis for this.  She did not hit her head or lose consciousness.  She denies chest pain or shortness of breath.  She denies numbness or tingling.  She has severe pain in the left hip that radiates to the mid thigh.   Fall  Hip Pain       Home Medications Prior to Admission medications   Medication Sig Start Date End Date Taking? Authorizing Provider  ALEVE ARTHRITIS PAIN 1 % GEL Apply 2 g topically 4 (four) times daily as needed (for pain).    [provider]  apixaban (ELIQUIS) 2.5 MG TABS tablet Take 1 tablet (2.5 mg total) by mouth 2 (two) times daily. 02/25/22   Yates Decamp, MD  budesonide (PULMICORT) 0.5 MG/2ML nebulizer solution Take 2 mLs (0.5 mg total) by nebulization in the morning and at bedtime. Patient taking differently: Take 0.5 mg by nebulization daily. 03/22/22   Glenford Bayley, NP  busPIRone (BUSPAR) 7.5 MG tablet Take 7.5 mg by mouth See admin instructions. Take 7.5 mg by mouth at bedtime and an additional 7.5 mg once a day as needed for anxiety    [provider]  Cholecalciferol (VITAMIN D3) 125 MCG (5000 UT) CAPS Take 1 capsule by mouth daily.    [provider]  dapagliflozin propanediol (FARXIGA)  10 MG TABS tablet Take 1 tablet (10 mg total) by mouth daily before breakfast. 01/27/22   Yates Decamp, MD  diclofenac sodium (VOLTAREN) 1 % GEL Apply 2.25 g topically 3 (three) times daily as needed (for pain). 10/23/18   [provider]  furosemide (LASIX) 20 MG tablet Take 1 tablet (20 mg total) by mouth daily. 12/02/22   Danford, Earl Lites, MD  HYDROcodone-acetaminophen (NORCO) 7.5-325 MG tablet Take 1 tablet by mouth every 6 (six) hours as needed for moderate pain. Patient taking differently: Take 1 tablet by mouth daily as needed for moderate pain (pain score 4-6). 04/13/15   Vassie Loll, MD  latanoprost (XALATAN) 0.005 % ophthalmic solution Place 1 drop into both eyes at bedtime.    [provider]  levothyroxine (SYNTHROID) 125 MCG tablet Take 125 mcg by mouth daily before breakfast.     [provider]  melatonin 3 MG TABS tablet Take 3 mg by mouth at bedtime.    [provider]  modafinil (PROVIGIL) 100 MG tablet Take 100 mg by mouth every morning. 08/31/22   [provider]  Multiple Vitamins-Minerals (PRESERVISION AREDS 2 PO) Take by mouth.    [provider]  nortriptyline (PAMELOR) 10 MG capsule Take 10 mg by mouth daily. 08/31/22   [provider]  rOPINIRole (REQUIP) 1 MG tablet Take 1 mg by mouth See admin instructions.  Take 1 mg by mouth at 1 PM and 2 mg at 9 PM 06/19/15   [provider]  sacubitril-valsartan (ENTRESTO) 24-26 MG Take 1 tablet by mouth 2 (two) times daily. 06/25/22   Zannie Cove, MD  spironolactone (ALDACTONE) 25 MG tablet Take 25 mg by mouth daily. 11/24/22   [provider]  SYSTANE COMPLETE PF 0.6 % SOLN Place 1 drop into both eyes 4 (four) times daily as needed (for dryness).    [provider]      Allergies    Statins, Atorvastatin, Gabapentin, and Methocarbamol    Review of Systems   Review of Systems  Physical Exam Updated Vital Signs There were no vitals taken  for this visit. Physical Exam Vitals and nursing note reviewed.  Constitutional:      General: She is not in acute distress.    Appearance: She is well-developed. She is not diaphoretic.  HENT:     Head: Normocephalic and atraumatic.     Right Ear: External ear normal.     Left Ear: External ear normal.     Nose: Nose normal.     Mouth/Throat:     Mouth: Mucous membranes are moist.  Eyes:     General: No scleral icterus.    Conjunctiva/sclera: Conjunctivae normal.  Cardiovascular:     Rate and Rhythm: Normal rate and regular rhythm.     Heart sounds: Normal heart sounds. No murmur heard.    No friction rub. No gallop.  Pulmonary:     Effort: Pulmonary effort is normal. No respiratory distress.     Breath sounds: Normal breath sounds.  Abdominal:     General: Bowel sounds are normal. There is no distension.     Palpations: Abdomen is soft. There is no mass.     Tenderness: There is no abdominal tenderness. There is no guarding.  Musculoskeletal:     Cervical back: Normal range of motion.     Comments: Left leg is shortened and laterally rotated did.  Palpable pulses bilateral PT and DP pulse intact.  Wiggles toes.  Skin:    General: Skin is warm and dry.  Neurological:     Mental Status: She is alert and oriented to person, place, and time.  Psychiatric:        Behavior: Behavior normal.     ED Results / Procedures / Treatments   Labs (all labs ordered are listed, but only abnormal results are displayed) Labs Reviewed - No data to display  EKG None  Radiology No results found.  Procedures Procedures    Medications Ordered in ED Medications - No data to display  ED Course/ Medical Decision Making/ A&P Clinical Course as of 02/02/23 2129  Thu Feb 02, 2023  2127 I visualized the left trochanter on wet read of films at bedside.  She has intertrochanteric fracture of the left hip. [AH]    Clinical Course User Index [AH] Arthor Captain, PA-C                                  Medical Decision Making 89 year old female here for mechanical fall. No signs of head injury.  On Eliquis. Chronic comorbidities of aortic valve stenosis with need for replacement. Patient has intertrochanteric left hip fracture.  Case discussed with Dr. Steward Drone who states the Ortho service will round on the patient. Case discussed with Dr. Janalyn Shy who will admit the patient.  She  will need cardiac clearance.  Patient given pain medications here with moderate improvement in her pain she is neurovascularly intact.  Amount and/or Complexity of Data Reviewed Labs: ordered.    Details: CBC unremarkable BM P with mildly elevated blood glucose likely and phase reactant Radiology: ordered and independent interpretation performed.    Details: I personally visualized and interpreted the images using our PACS system. Acute findings include:  chest x-ray which shows chronic cardiomegaly Hip xray left intertroch frx   Risk Prescription drug management. Decision regarding hospitalization.           Final Clinical Impression(s) / ED Diagnoses Final diagnoses:  None    Rx / DC Orders ED Discharge Orders     None         Arthor Captain, PA-C 02/03/23 1007    Tanda Rockers A, DO 02/05/23 [phone]

## 2023-02-02 NOTE — Progress Notes (Signed)
PHARMACY - ANTICOAGULATION CONSULT NOTE  Pharmacy Consult for IV heparin  Indication: atrial fibrillation  Allergies  Allergen Reactions   Statins Other (See Comments)    Muscle aches and INTERNAL BLEEDING   Atorvastatin Other (See Comments)    Myalgias   Gabapentin Other (See Comments)    "Made me loopy"   Methocarbamol Other (See Comments)    "Made me loopy"    Patient Measurements: Weight: 90.7 kg (200 lb) Heparin Dosing Weight: 73.1 kg   Vital Signs: Temp: 97.9 F (36.6 C) (10/24 2106) Temp Source: Oral (10/24 2106) BP: 164/84 (10/24 2115) Pulse Rate: 72 (10/24 2115)  Labs: Recent Labs    02/02/23 2142  HGB 13.7  HCT 43.2  PLT 272  LABPROT 15.8*  INR 1.2  CREATININE 0.98    Estimated Creatinine Clearance: 43.2 mL/min (by C-G formula based on SCr of 0.98 mg/dL).   Medical History: Past Medical History:  Diagnosis Date   Acute renal failure superimposed on stage 3b chronic kidney disease (HCC) 06/15/2021   Anemia    h/o hemorrhoidal bleeding and blood transfusion   Chronic diastolic CHF (congestive heart failure) (HCC)    Depression    Diverticulosis    DOE (dyspnea on exertion)    Esophageal reflux    GERD (gastroesophageal reflux disease)    Hypothyroidism    Insomnia    LBP (low back pain)    OAB (overactive bladder)    Obesity    OSA (obstructive sleep apnea)    Osteoarthritis    Paroxysmal atrial fibrillation (HCC)    Rheumatoid arthritis(714.0)    Shoulder pain, bilateral    Unspecified essential hypertension    Venous insufficiency     Assessment: Lisa Mcclure is a 89 y.o. year old female admitted on 02/02/2023 with concern for hip fracture. Eliquis prior to admission for afib and hx LV thrombus (last dose 10/24 0900). On reduced dose per outpatient cardiologist of eliquis due to bleeding risk. Pharmacy consulted to dose heparin.   Goal of Therapy:  Heparin level 0.3-0.7 units/ml Monitor platelets by anticoagulation protocol:  Yes   Plan:  Start heparin infusion at 1000 units/hr (no bolus) 8 aPTT  Daily aPTT, heparin level, CBC, and monitoring for bleeding F/u plans to resume home anticoagulation after orthopedic surgery  Thank you for allowing pharmacy to participate in this patient's care.  Marja Kays, PharmD Emergency Medicine Clinical Pharmacist 02/02/2023,11:48 PM

## 2023-02-03 ENCOUNTER — Inpatient Hospital Stay (HOSPITAL_COMMUNITY): Payer: Medicare Other | Admitting: Anesthesiology

## 2023-02-03 ENCOUNTER — Encounter (HOSPITAL_COMMUNITY)
Admission: EM | Disposition: A | Discharge status: Skilled Nursing Facility | Payer: Self-pay | Source: Home / Self Care | Attending: Family Medicine

## 2023-02-03 ENCOUNTER — Telehealth: Payer: Self-pay

## 2023-02-03 ENCOUNTER — Encounter (HOSPITAL_COMMUNITY): Payer: Self-pay | Admitting: Internal Medicine

## 2023-02-03 ENCOUNTER — Inpatient Hospital Stay (HOSPITAL_COMMUNITY): Payer: Medicare Other

## 2023-02-03 ENCOUNTER — Other Ambulatory Visit: Payer: Self-pay

## 2023-02-03 ENCOUNTER — Other Ambulatory Visit (HOSPITAL_COMMUNITY): Payer: Self-pay

## 2023-02-03 DIAGNOSIS — E039 Hypothyroidism, unspecified: Secondary | ICD-10-CM | POA: Diagnosis not present

## 2023-02-03 DIAGNOSIS — I4891 Unspecified atrial fibrillation: Secondary | ICD-10-CM | POA: Diagnosis not present

## 2023-02-03 DIAGNOSIS — S72142A Displaced intertrochanteric fracture of left femur, initial encounter for closed fracture: Secondary | ICD-10-CM | POA: Diagnosis not present

## 2023-02-03 DIAGNOSIS — I350 Nonrheumatic aortic (valve) stenosis: Secondary | ICD-10-CM | POA: Diagnosis not present

## 2023-02-03 DIAGNOSIS — I5032 Chronic diastolic (congestive) heart failure: Secondary | ICD-10-CM

## 2023-02-03 DIAGNOSIS — I10 Essential (primary) hypertension: Secondary | ICD-10-CM | POA: Diagnosis not present

## 2023-02-03 HISTORY — PX: INTRAMEDULLARY (IM) NAIL INTERTROCHANTERIC: SHX5875

## 2023-02-03 LAB — CBC
HCT: 44.1 % (ref 36.0–46.0)
Hemoglobin: 13.9 g/dL (ref 12.0–15.0)
MCH: 30.4 pg (ref 26.0–34.0)
MCHC: 31.5 g/dL (ref 30.0–36.0)
MCV: 96.5 fL (ref 80.0–100.0)
Platelets: 259 10*3/uL (ref 150–400)
RBC: 4.57 MIL/uL (ref 3.87–5.11)
RDW: 14.2 % (ref 11.5–15.5)
WBC: 15.4 10*3/uL — ABNORMAL HIGH (ref 4.0–10.5)
nRBC: 0 % (ref 0.0–0.2)

## 2023-02-03 LAB — COMPREHENSIVE METABOLIC PANEL
ALT: 12 U/L (ref 0–44)
AST: 19 U/L (ref 15–41)
Albumin: 3.6 g/dL (ref 3.5–5.0)
Alkaline Phosphatase: 85 U/L (ref 38–126)
Anion gap: 12 (ref 5–15)
BUN: 25 mg/dL — ABNORMAL HIGH (ref 8–23)
CO2: 26 mmol/L (ref 22–32)
Calcium: 8.8 mg/dL — ABNORMAL LOW (ref 8.9–10.3)
Chloride: 100 mmol/L (ref 98–111)
Creatinine, Ser: 0.89 mg/dL (ref 0.44–1.00)
GFR, Estimated: >60 mL/min (ref >60)
Glucose, Bld: 98 mg/dL (ref 70–99)
Potassium: 3.9 mmol/L (ref 3.5–5.1)
Sodium: 138 mmol/L (ref 135–145)
Total Bilirubin: 0.9 mg/dL (ref 0.3–1.2)
Total Protein: 7.4 g/dL (ref 6.5–8.1)

## 2023-02-03 LAB — SURGICAL PCR SCREEN
MRSA, PCR: NEGATIVE
Staphylococcus aureus: NEGATIVE

## 2023-02-03 LAB — APTT: aPTT: 54 s — ABNORMAL HIGH (ref 24–36)

## 2023-02-03 SURGERY — FIXATION, FRACTURE, INTERTROCHANTERIC, WITH INTRAMEDULLARY ROD
Anesthesia: General | Site: Hip | Laterality: Left

## 2023-02-03 MED ORDER — INFLUENZA VAC A&B SURF ANT ADJ 0.5 ML IM SUSY
0.5000 mL | PREFILLED_SYRINGE | INTRAMUSCULAR | Status: AC
  Administered 2023-02-04: 0.5 mL via INTRAMUSCULAR
  Filled 2023-02-03: qty 0.5

## 2023-02-03 MED ORDER — LACTATED RINGERS IV SOLN: INTRAVENOUS | Status: DC

## 2023-02-03 MED ORDER — POLYVINYL ALCOHOL 1.4 % OP SOLN
1.0000 [drp] | Freq: Four times a day (QID) | OPHTHALMIC | Status: DC | PRN
  Administered 2023-02-06: 1 [drp] via OPHTHALMIC

## 2023-02-03 MED ORDER — LIDOCAINE 2% (20 MG/ML) 5 ML SYRINGE
INTRAMUSCULAR | Status: DC | PRN
  Administered 2023-02-03: 60 mg via INTRAVENOUS

## 2023-02-03 MED ORDER — ACETAMINOPHEN 10 MG/ML IV SOLN
1000.0000 mg | Freq: Once | INTRAVENOUS | Status: DC | PRN
  Administered 2023-02-03: 1000 mg via INTRAVENOUS

## 2023-02-03 MED ORDER — HYDROMORPHONE HCL 1 MG/ML IJ SOLN
0.5000 mg | INTRAMUSCULAR | Status: DC | PRN
  Administered 2023-02-05: 1 mg via INTRAVENOUS
  Administered 2023-02-05: 0.5 mg via INTRAVENOUS
  Filled 2023-02-03 (×2): qty 1

## 2023-02-03 MED ORDER — DEXAMETHASONE SODIUM PHOSPHATE 10 MG/ML IJ SOLN
INTRAMUSCULAR | Status: DC | PRN
  Administered 2023-02-03: 10 mg via INTRAVENOUS

## 2023-02-03 MED ORDER — LATANOPROST 0.005 % OP SOLN
1.0000 [drp] | Freq: Every day | OPHTHALMIC | Status: DC
Start: 2023-02-03 — End: 2023-02-07
  Administered 2023-02-03 – 2023-02-06 (×4): 1 [drp] via OPHTHALMIC
  Filled 2023-02-03: qty 2.5

## 2023-02-03 MED ORDER — OXYCODONE HCL 5 MG PO TABS
5.0000 mg | ORAL_TABLET | ORAL | Status: DC | PRN
Start: 2023-02-03 — End: 2023-02-07
  Administered 2023-02-03 – 2023-02-04 (×3): 10 mg via ORAL
  Administered 2023-02-06: 5 mg via ORAL
  Administered 2023-02-06: 10 mg via ORAL
  Administered 2023-02-06 (×2): 5 mg via ORAL
  Administered 2023-02-06 – 2023-02-07 (×3): 10 mg via ORAL
  Filled 2023-02-03 (×3): qty 2
  Filled 2023-02-03 (×3): qty 1
  Filled 2023-02-03 (×4): qty 2
  Filled 2023-02-03: qty 1

## 2023-02-03 MED ORDER — TRANEXAMIC ACID-NACL 1000-0.7 MG/100ML-% IV SOLN
1000.0000 mg | INTRAVENOUS | Status: AC
  Administered 2023-02-03: 1000 mg via INTRAVENOUS
  Filled 2023-02-03: qty 100

## 2023-02-03 MED ORDER — OXYCODONE-ACETAMINOPHEN 5-325 MG PO TABS
1.0000 | ORAL_TABLET | Freq: Three times a day (TID) | ORAL | Status: AC
  Administered 2023-02-03 – 2023-02-05 (×8): 1 via ORAL
  Filled 2023-02-03 (×8): qty 1

## 2023-02-03 MED ORDER — 0.9 % SODIUM CHLORIDE (POUR BTL) OPTIME
TOPICAL | Status: DC | PRN
  Administered 2023-02-03: 1000 mL

## 2023-02-03 MED ORDER — OXYCODONE HCL 5 MG/5ML PO SOLN: 5.0000 mg | Freq: Once | ORAL | Status: DC | PRN

## 2023-02-03 MED ORDER — ROPINIROLE HCL 0.5 MG PO TABS
1.0000 mg | ORAL_TABLET | Freq: Every day | ORAL | Status: DC
  Administered 2023-02-03 – 2023-02-06 (×4): 1 mg via ORAL
  Filled 2023-02-03 (×4): qty 2

## 2023-02-03 MED ORDER — SUGAMMADEX SODIUM 200 MG/2ML IV SOLN
INTRAVENOUS | Status: DC | PRN
  Administered 2023-02-03: 200 mg via INTRAVENOUS

## 2023-02-03 MED ORDER — MORPHINE SULFATE (PF) 2 MG/ML IV SOLN: 2.0000 mg | INTRAVENOUS | Status: DC | PRN

## 2023-02-03 MED ORDER — ACETAMINOPHEN 325 MG PO TABS: 325.0000 mg | ORAL_TABLET | Freq: Four times a day (QID) | ORAL | Status: DC | PRN

## 2023-02-03 MED ORDER — BUSPIRONE HCL 5 MG PO TABS
7.5000 mg | ORAL_TABLET | Freq: Every day | ORAL | Status: DC
  Administered 2023-02-03 – 2023-02-06 (×5): 7.5 mg via ORAL
  Filled 2023-02-03 (×3): qty 2
  Filled 2023-02-03 (×2): qty 1
  Filled 2023-02-03: qty 2

## 2023-02-03 MED ORDER — METOCLOPRAMIDE HCL 5 MG PO TABS: 5.0000 mg | ORAL_TABLET | Freq: Three times a day (TID) | ORAL | Status: DC | PRN

## 2023-02-03 MED ORDER — MENTHOL 3 MG MT LOZG
1.0000 | LOZENGE | OROMUCOSAL | Status: DC | PRN
  Administered 2023-02-05: 3 mg via ORAL
  Filled 2023-02-03: qty 9

## 2023-02-03 MED ORDER — STERILE WATER FOR IRRIGATION IR SOLN
Status: DC | PRN
  Administered 2023-02-03: 1000 mL

## 2023-02-03 MED ORDER — HEPARIN (PORCINE) 25000 UT/250ML-% IV SOLN
1150.0000 [IU]/h | INTRAVENOUS | Status: DC
  Administered 2023-02-03: 1000 [IU]/h via INTRAVENOUS
  Filled 2023-02-03: qty 250

## 2023-02-03 MED ORDER — ACETAMINOPHEN 10 MG/ML IV SOLN
INTRAVENOUS | Status: AC
  Filled 2023-02-03: qty 100

## 2023-02-03 MED ORDER — APIXABAN 2.5 MG PO TABS
2.5000 mg | ORAL_TABLET | Freq: Two times a day (BID) | ORAL | Status: DC
  Administered 2023-02-04 – 2023-02-07 (×7): 2.5 mg via ORAL
  Filled 2023-02-03 (×7): qty 1

## 2023-02-03 MED ORDER — PHENOL 1.4 % MT LIQD: 1.0000 | OROMUCOSAL | Status: DC | PRN

## 2023-02-03 MED ORDER — PROPOFOL 10 MG/ML IV BOLUS
INTRAVENOUS | Status: AC
  Filled 2023-02-03: qty 20

## 2023-02-03 MED ORDER — FENTANYL CITRATE (PF) 100 MCG/2ML IJ SOLN
25.0000 ug | INTRAMUSCULAR | Status: DC | PRN
  Administered 2023-02-03: 25 ug via INTRAVENOUS

## 2023-02-03 MED ORDER — ORAL CARE MOUTH RINSE: 15.0000 mL | Freq: Once | OROMUCOSAL | Status: AC

## 2023-02-03 MED ORDER — CEFAZOLIN SODIUM-DEXTROSE 2-4 GM/100ML-% IV SOLN
2.0000 g | Freq: Four times a day (QID) | INTRAVENOUS | Status: AC
  Administered 2023-02-03 – 2023-02-04 (×2): 2 g via INTRAVENOUS
  Filled 2023-02-03 (×2): qty 100

## 2023-02-03 MED ORDER — PROPOFOL 10 MG/ML IV BOLUS
INTRAVENOUS | Status: DC | PRN
  Administered 2023-02-03: 50 mg via INTRAVENOUS

## 2023-02-03 MED ORDER — TRANEXAMIC ACID-NACL 1000-0.7 MG/100ML-% IV SOLN
1000.0000 mg | Freq: Once | INTRAVENOUS | Status: AC
  Administered 2023-02-03: 1000 mg via INTRAVENOUS
  Filled 2023-02-03: qty 100

## 2023-02-03 MED ORDER — FENTANYL CITRATE PF 50 MCG/ML IJ SOSY
25.0000 ug | PREFILLED_SYRINGE | INTRAMUSCULAR | Status: DC | PRN
  Administered 2023-02-03 (×4): 25 ug via INTRAVENOUS
  Filled 2023-02-03 (×4): qty 1

## 2023-02-03 MED ORDER — FENTANYL CITRATE (PF) 100 MCG/2ML IJ SOLN
INTRAMUSCULAR | Status: AC
  Filled 2023-02-03: qty 2

## 2023-02-03 MED ORDER — AMISULPRIDE (ANTIEMETIC) 5 MG/2ML IV SOLN: 10.0000 mg | Freq: Once | INTRAVENOUS | Status: DC | PRN

## 2023-02-03 MED ORDER — POLYVINYL ALCOHOL 1.4 % OP SOLN
1.0000 [drp] | OPHTHALMIC | Status: DC | PRN
  Filled 2023-02-03: qty 15

## 2023-02-03 MED ORDER — BUSPIRONE HCL 5 MG PO TABS: 7.5000 mg | ORAL_TABLET | Freq: Every day | ORAL | Status: DC | PRN

## 2023-02-03 MED ORDER — DOCUSATE SODIUM 100 MG PO CAPS
100.0000 mg | ORAL_CAPSULE | Freq: Two times a day (BID) | ORAL | Status: DC
  Administered 2023-02-03 – 2023-02-07 (×8): 100 mg via ORAL
  Filled 2023-02-03 (×8): qty 1

## 2023-02-03 MED ORDER — CEFAZOLIN SODIUM-DEXTROSE 2-4 GM/100ML-% IV SOLN
2.0000 g | Freq: Once | INTRAVENOUS | Status: AC
  Administered 2023-02-03: 2 g via INTRAVENOUS
  Filled 2023-02-03: qty 100

## 2023-02-03 MED ORDER — FENTANYL CITRATE (PF) 100 MCG/2ML IJ SOLN
INTRAMUSCULAR | Status: AC
  Administered 2023-02-03: 25 ug via INTRAVENOUS
  Filled 2023-02-03: qty 2

## 2023-02-03 MED ORDER — OXYCODONE HCL 5 MG PO TABS: 5.0000 mg | ORAL_TABLET | Freq: Once | ORAL | Status: DC | PRN

## 2023-02-03 MED ORDER — KETAMINE HCL 50 MG/5ML IJ SOSY
PREFILLED_SYRINGE | INTRAMUSCULAR | Status: AC
  Filled 2023-02-03: qty 5

## 2023-02-03 MED ORDER — ROPINIROLE HCL 0.5 MG PO TABS
2.0000 mg | ORAL_TABLET | Freq: Every day | ORAL | Status: DC
  Administered 2023-02-03 – 2023-02-06 (×4): 2 mg via ORAL
  Filled 2023-02-03 (×3): qty 4

## 2023-02-03 MED ORDER — FENTANYL CITRATE (PF) 250 MCG/5ML IJ SOLN
INTRAMUSCULAR | Status: DC | PRN
  Administered 2023-02-03: 50 ug via INTRAVENOUS
  Administered 2023-02-03 (×2): 25 ug via INTRAVENOUS

## 2023-02-03 MED ORDER — KETAMINE HCL 10 MG/ML IJ SOLN
INTRAMUSCULAR | Status: DC | PRN
Start: 2023-02-03 — End: 2023-02-03
  Administered 2023-02-03: 20 mg via INTRAVENOUS

## 2023-02-03 MED ORDER — CHLORHEXIDINE GLUCONATE 0.12 % MT SOLN
15.0000 mL | Freq: Once | OROMUCOSAL | Status: AC
  Administered 2023-02-03: 15 mL via OROMUCOSAL

## 2023-02-03 MED ORDER — PHENYLEPHRINE HCL-NACL 20-0.9 MG/250ML-% IV SOLN
INTRAVENOUS | Status: DC | PRN
  Administered 2023-02-03: 50 ug/min via INTRAVENOUS

## 2023-02-03 MED ORDER — PHENYLEPHRINE 80 MCG/ML (10ML) SYRINGE FOR IV PUSH (FOR BLOOD PRESSURE SUPPORT)
PREFILLED_SYRINGE | INTRAVENOUS | Status: DC | PRN
  Administered 2023-02-03 (×2): 80 ug via INTRAVENOUS
  Administered 2023-02-03 (×5): 160 ug via INTRAVENOUS

## 2023-02-03 MED ORDER — METOCLOPRAMIDE HCL 5 MG/ML IJ SOLN: 5.0000 mg | Freq: Three times a day (TID) | INTRAMUSCULAR | Status: DC | PRN

## 2023-02-03 MED ORDER — ONDANSETRON HCL 4 MG/2ML IJ SOLN
INTRAMUSCULAR | Status: DC | PRN
  Administered 2023-02-03: 4 mg via INTRAVENOUS

## 2023-02-03 MED ORDER — MORPHINE SULFATE (PF) 2 MG/ML IV SOLN
2.0000 mg | INTRAVENOUS | Status: DC | PRN
  Administered 2023-02-03 (×2): 2 mg via INTRAVENOUS
  Filled 2023-02-03 (×2): qty 1

## 2023-02-03 SURGICAL SUPPLY — 52 items
BAG COUNTER SPONGE SURGICOUNT (BAG) ×1 IMPLANT
BAG SPNG CNTER NS LX DISP (BAG) ×1
BIT DRILL INTERTAN LAG SCREW (BIT) IMPLANT
BLADE SURG 10 STRL SS (BLADE) IMPLANT
BLADE SURG 15 STRL LF DISP TIS (BLADE) ×1 IMPLANT
BLADE SURG 15 STRL SS (BLADE)
BNDG GAUZE DERMACEA FLUFF 4 (GAUZE/BANDAGES/DRESSINGS) ×1 IMPLANT
BNDG GZE DERMACEA 4 6PLY (GAUZE/BANDAGES/DRESSINGS)
COVER PERINEAL POST (MISCELLANEOUS) ×1 IMPLANT
COVER SURGICAL LIGHT HANDLE (MISCELLANEOUS) ×1 IMPLANT
DRAPE STERI IOBAN 125X83 (DRAPES) ×1 IMPLANT
DRESSING MEPILEX FLEX 4X4 (GAUZE/BANDAGES/DRESSINGS) IMPLANT
DRSG AQUACEL AG ADV 3.5X 4 (GAUZE/BANDAGES/DRESSINGS) IMPLANT
DRSG MEPILEX FLEX 4X4 (GAUZE/BANDAGES/DRESSINGS)
DRSG MEPILEX POST OP 4X8 (GAUZE/BANDAGES/DRESSINGS) ×1 IMPLANT
DRSG TEGADERM 4X4.75 (GAUZE/BANDAGES/DRESSINGS) IMPLANT
DRSG XEROFORM 1X8 (GAUZE/BANDAGES/DRESSINGS) IMPLANT
DURAPREP 26ML APPLICATOR (WOUND CARE) ×1 IMPLANT
ELECT REM PT RETURN 9FT ADLT (ELECTROSURGICAL) ×1
ELECTRODE REM PT RTRN 9FT ADLT (ELECTROSURGICAL) ×1 IMPLANT
FACESHIELD WRAPAROUND (MASK) ×1 IMPLANT
FACESHIELD WRAPAROUND OR TEAM (MASK) ×1 IMPLANT
GAUZE PAD ABD 8X10 STRL (GAUZE/BANDAGES/DRESSINGS) ×2 IMPLANT
GAUZE XEROFORM 5X9 LF (GAUZE/BANDAGES/DRESSINGS) ×1 IMPLANT
GLOVE BIO SURGEON STRL SZ8 (GLOVE) ×1 IMPLANT
GLOVE BIOGEL PI IND STRL 8 (GLOVE) ×1 IMPLANT
GLOVE ORTHO TXT STRL SZ7.5 (GLOVE) ×1 IMPLANT
GOWN STRL REUS W/ TWL LRG LVL3 (GOWN DISPOSABLE) ×1 IMPLANT
GOWN STRL REUS W/ TWL XL LVL3 (GOWN DISPOSABLE) ×2 IMPLANT
GOWN STRL REUS W/TWL LRG LVL3 (GOWN DISPOSABLE) ×1
GOWN STRL REUS W/TWL XL LVL3 (GOWN DISPOSABLE) ×2
GUIDE PIN 3.2X343 (PIN) ×2
GUIDE PIN 3.2X343MM (PIN) ×2
KIT BASIN OR (CUSTOM PROCEDURE TRAY) ×1 IMPLANT
KIT TURNOVER KIT B (KITS) ×1 IMPLANT
MANIFOLD NEPTUNE II (INSTRUMENTS) ×1 IMPLANT
NAIL TRIGEN LEFT 10X38-125 (Nail) IMPLANT
NS IRRIG 1000ML POUR BTL (IV SOLUTION) ×1 IMPLANT
PACK GENERAL/GYN (CUSTOM PROCEDURE TRAY) ×1 IMPLANT
PAD ARMBOARD 7.5X6 YLW CONV (MISCELLANEOUS) ×2 IMPLANT
PAD CAST 4YDX4 CTTN HI CHSV (CAST SUPPLIES) ×2 IMPLANT
PADDING CAST COTTON 4X4 STRL (CAST SUPPLIES)
PIN GUIDE 3.2X343MM (PIN) IMPLANT
SCREW LAG COMPR KIT 105/100 (Screw) IMPLANT
STAPLER VISISTAT 35W (STAPLE) ×1 IMPLANT
SUT VIC AB 0 CT1 27 (SUTURE) ×1
SUT VIC AB 0 CT1 27XBRD ANBCTR (SUTURE) ×2 IMPLANT
SUT VIC AB 2-0 CT1 27 (SUTURE) ×1
SUT VIC AB 2-0 CT1 TAPERPNT 27 (SUTURE) ×2 IMPLANT
TOWEL GREEN STERILE (TOWEL DISPOSABLE) ×1 IMPLANT
TOWEL GREEN STERILE FF (TOWEL DISPOSABLE) ×1 IMPLANT
WATER STERILE IRR 1000ML POUR (IV SOLUTION) ×1 IMPLANT

## 2023-02-03 NOTE — Progress Notes (Addendum)
Patient's hip pain pain is not improving with morphine. - Starting Percocet scheduled every 8 hours and changing morphine to fentanyl 25 mcg every 2 hour as needed for severe and breakthrough pain.  Tereasa Coop, MD [facility] 02/03/2023, 12:41 AM

## 2023-02-03 NOTE — Anesthesia Procedure Notes (Addendum)
Arterial Line Insertion Start/End10/25/2024 3:18 PM, 02/03/2023 3:21 PM Performed by: Linton Rump, MD, anesthesiologist  Patient location: Pre-op. Preanesthetic checklist: patient identified, IV checked, site marked, risks and benefits discussed, surgical consent, monitors and equipment checked, pre-op evaluation, timeout performed and anesthesia consent Lidocaine 1% used for infiltration Right, radial was placed Catheter size: 20 G Hand hygiene performed  and maximum sterile barriers used   Attempts: 1 Procedure performed without using ultrasound guided technique. Following insertion, dressing applied and Biopatch. Post procedure assessment: normal and unchanged  Patient tolerated the procedure well with no immediate complications.

## 2023-02-03 NOTE — Progress Notes (Signed)
PROGRESS NOTE    Lisa Mcclure  JXB:[id number] DOB: 1932-06-24 DOA: 02/02/2023 PCP: Garlan Fillers, MD   Brief Narrative: No notes on file   Assessment and Plan:  Displaced intertrochanteric fracture of the left femur Secondary to trip and fall.  No associated loss of consciousness.  Initial x-ray significant for evidence of a comminuted displaced intertrochanteric left proximal femur fracture.  Orthopedic surgery was consulted with plan for surgical repair.  Case discussed with Dr. Jacinto Halim who recommends to proceed with surgery secondary to high morbidity of unrepaired fracture.  Chronic diastolic heart failure Stable. Patient is on furosemide, Entresto and spironolactone as an outpatient. -Continue Entresto, spironolactone and furosemide  Severe aortic stenosis Patient follows with cardiology with outpatient consideration for aortic valve replacement. Discussed with cardiology and there is no barrier to surgery regarding aortic stenosis when weighed against morbidity of no surgical fixation.  Paroxysmal atrial fibrillation Currently in sinus rhythm with ventricular trigeminy on EKG. Patient is managed on Eliquis for stroke risk reduction. She is not on rate or rhythm control. Eliquis transitioned to Heparin IV. -Continue Heparin IV  Left ventricular thrombus Patient is managed on Eliquis as an outpatient which is held secondary to need for surgery. Patient transitioned to Heparin IV -Continue Heparin IV; transition back to Eliquis pending orthopedic surgery recommendations  Hypothyroidism -Continue Synthroid  COPD Stable and without exacerbation.  OSA Not on CPAP  DVT prophylaxis: Heparin IV Code Status:   Code Status: Full Code Family Communication: Daughter and pastor at bedside Disposition Plan: Discharge likely to SNF in 3-5 days pending orthopedic surgery recommendations/management in addition to PT/OT recommendations   Consultants:  Orthopedic  surgery  Procedures:  None  Antimicrobials: None    Subjective: Patient reports ongoing left leg pain at hip.  Objective: BP (!) 175/87   Pulse 75   Temp 98 F (36.7 C) (Oral)   Resp 15   Ht 5\' 3"  (1.6 m)   Wt 90.7 kg   SpO2 98%   BMI 35.42 kg/m   Examination:  General exam: Appears calm and comfortable Respiratory system: Clear to auscultation. Respiratory effort normal. Cardiovascular system: S1 & S2 heard, RRR. Systolic murmur Gastrointestinal system: Abdomen is nondistended, soft and nontender. Normal bowel sounds heard. Central nervous system: Alert and oriented. No focal neurological deficits. Musculoskeletal: Left leg is elevated and externally rotated Psychiatry: Judgement and insight appear normal. Mood & affect appropriate.    Data Reviewed: I have personally reviewed following labs and imaging studies  CBC Lab Results  Component Value Date   WBC 15.4 (H) 02/03/2023   RBC 4.57 02/03/2023   HGB 13.9 02/03/2023   HCT 44.1 02/03/2023   MCV 96.5 02/03/2023   MCH 30.4 02/03/2023   PLT 259 02/03/2023   MCHC 31.5 02/03/2023   RDW 14.2 02/03/2023   LYMPHSABS 0.9 02/02/2023   MONOABS 1.0 02/02/2023   EOSABS 0.1 02/02/2023   BASOSABS 0.0 02/02/2023     Last metabolic panel Lab Results  Component Value Date   NA 138 02/03/2023   K 3.9 02/03/2023   CL 100 02/03/2023   CO2 26 02/03/2023   BUN 25 (H) 02/03/2023   CREATININE 0.89 02/03/2023   GLUCOSE 98 02/03/2023   GFRNONAA >60 02/03/2023   GFRAA 41 (L) 04/30/2020   CALCIUM 8.8 (L) 02/03/2023   PHOS 5.1 (H) 06/20/2022   PROT 7.4 02/03/2023   ALBUMIN 3.6 02/03/2023   BILITOT 0.9 02/03/2023   ALKPHOS 85 02/03/2023   AST 19 02/03/2023  ALT 12 02/03/2023   ANIONGAP 12 02/03/2023    GFR: Estimated Creatinine Clearance: 47.6 mL/min (by C-G formula based on SCr of 0.89 mg/dL).  Recent Results (from the past 240 hour(s))  Surgical pcr screen     Status: None   Collection Time: 02/03/23  2:16 PM    Specimen: Nasal Mucosa; Nasal Swab  Result Value Ref Range Status   MRSA, PCR NEGATIVE NEGATIVE Final   Staphylococcus aureus NEGATIVE NEGATIVE Final    Comment: (NOTE) The Xpert SA Assay (FDA approved for NASAL specimens in patients 76 years of age and older), is one component of a comprehensive surveillance program. It is not intended to diagnose infection nor to guide or monitor treatment. Performed at [facility], [address], [place], [place] [address]       Radiology Studies: DG Chest Port 1 View  Result Date: 02/02/2023 CLINICAL DATA:  Fall with left hip fracture. EXAM: PORTABLE CHEST 1 VIEW COMPARISON:  11/29/2022 FINDINGS: Chronic cardiomegaly. Unchanged mediastinal contours. Aortic atherosclerosis. Chronic interstitial coarsening without focal airspace disease. No pleural effusion or pneumothorax. Advanced chronic shoulder arthropathy. IMPRESSION: Chronic cardiomegaly and interstitial coarsening. No acute chest findings. Electronically Signed   By: Narda Rutherford M.D.   On: 02/02/2023 21:44   DG Hip Unilat With Pelvis 2-3 Views Left  Result Date: 02/02/2023 CLINICAL DATA:  Fall with left leg pain. EXAM: DG HIP (WITH OR WITHOUT PELVIS) 2-3V LEFT COMPARISON:  None Available. FINDINGS: Comminuted displaced intertrochanteric left proximal femur fracture mild proximal migration of the femoral shaft. The femoral head is well seated. No hip dislocation. Bilateral narrowing hip osteoarthritis. Bony pelvis is intact. No rami fractures. No pubic symphyseal or sacroiliac diastasis. Aorto bi-iliac stent graft partially included in the field of view. Suspect prior bladder tack. IMPRESSION: Comminuted displaced intertrochanteric left proximal femur fracture. Electronically Signed   By: Narda Rutherford M.D.   On: 02/02/2023 21:41      LOS: 1 day    Jacquelin Hawking, MD [facility] 02/03/2023, 5:30 PM   If 7PM-7AM, please contact night-coverage [email or web address]

## 2023-02-03 NOTE — TOC CAGE-AID Note (Signed)
Transition of Care [facility]) - CAGE-AID Screening   Patient Details  Name: Lisa Mcclure MRN: [id number] Date of Birth: [birth year]  Hewitt Shorts, RN Trauma Response Nurse Phone Number: [phone] 02/03/2023, 11:46 AM       CAGE-AID Screening:    Have You Ever Felt You Ought to Cut Down on Your Drinking or Drug Use?: No Have People Annoyed You By [employer] Your Drinking Or Drug Use?: No Have You Felt Bad Or Guilty About Your Drinking Or Drug Use?: No Have You Ever Had a Drink or Used Drugs First Thing In The Morning to Steady Your Nerves or to Get Rid of a Hangover?: No CAGE-AID Score: 0  Substance Abuse Education Offered: No

## 2023-02-03 NOTE — Op Note (Signed)
Operative Note  Date of operation: 02/03/2023 Preoperative diagnosis: Comminuted and displaced left intertrochanteric femur fracture Postoperative diagnosis: Same  Procedure: Open reduction/internal fixation of left intertrochanteric femur fracture  Implants: Implant Name Type Inv. Item Serial No. Manufacturer Lot No. LRB No. Used Action  NAIL TRIGEN LEFT [id number] - [id number] Nail NAIL TRIGEN LEFT [id number]  [facility] [employer] [id number] Left 1 Implanted  SCREW LAG COMPR KIT 105/100 - [id number] Screw SCREW LAG COMPR KIT 105/100  [facility] [employer] [id number] Left 1 Implanted   Surgeon: Vanita Panda. Magnus Ivan, MD Assistant: Rexene Edison, PA-C  Anesthesia: General Antibiotics: 2 g IV Ancef Blood loss: Less than 100 cc Complications: None  Indications: The patient is an 89 year old female who unfortunately had a mechanical fall at home last evening injuring her left hip.  She was found to have a complex and comminuted left proximal femur fracture.  This was intertrochanteric fracture.  She was brought to the [facility] emergency room where this injury was discovered.  She is having severe and significant pain.  She is someone who does ambulate.  She has significant and severe comorbidities as a relates to cardiac issues.  She is also on blood thinning medication.  Surgery was delayed until today as a result of being on blood thinning medication and due to the fact that she needed medical and cardiac clearance for the surgery.  She presents for definitive fixation of this injury.  I had a long and thorough discussion with her and her daughter about the risks and benefits of the surgery and the rationale behind proceeding with surgery given the severity of her fracture.  Obviously her risks are heightened given her comorbidities.  Procedure description after informed consent was obtained and appropriate left hip was marked, the patient was brought to the operating room and  general anesthesia was obtained while she was on her stretcher.  In the holding room an A-line was placed as well.  She was next placed supine on the Hana fracture table with a perineal post in place in her left operative leg and inline skeletal traction in the right leg in a well-padded well leg holder.  We then assessed her fracture and with some gentle abduction as well as significant traction and internal rotation we were able to reduce the fracture in and acceptable position.  We then chose our femoral nail which was a 10 x 380 [employer] InterTAN femoral nail.  We Did sterile and the box we held her over the leg to get appropriate length.  This was then opened sterilely on the back table.  The left hip was next prepped draped with DuraPrep and sterile drapes.  A timeout was called and she was notified is correct patient correct left hip.  A lateral incision was made just proximal to the greater trochanter and carried down to the hip at the greater trochanter.  Under direct fluoroscopy a temporary guidepin was then inserted in an antegrade fashion from the tip of the greater trochanter to just past the level of the lesser trochanter.  This was appreciated in the AP and lateral planes.  We then used an initiating reamer over this guidepin to open of the femoral canal.  The initiating reamer and guidepin were removed.  Next we were able to place the 10 x 380 left InterTAN femoral nail down the femoral canal without needing to ream.  We verified the placement and direct fluoroscopy.  Next an incision was made over the  lateral aspect of the femur using the outrigger guide.  Through this we replaced the guidepin for our interdigitating lag screw compression screw construct.  We took measurements for a 105 lag screw/100 compression screws interdigitating construct.  We drilled to these levels and again verified the placement of direct fluoroscopy.  We were then able to place the lag screw and compression screw  without difficulty.  Once we got it down to almost the end we let some of the traction off the table in order to compress the fracture.  We are pleased with fracture reduction and verifying its placement in the AP and lateral planes in terms of the fracture reduction of the hardware.  The operating was removed.  We then irrigated both wounds with normal saline solution.  The deep tissue was closed with 0 Vicryl followed by 2-0 Vicryl close the subcutaneous tissue interrupted staples to close the 2 skin incisions.  Well-padded sterile dressing was applied.  The patient was taken off of the fracture table, awakened, extubated and taken to the recovery room.  Rexene Edison, PA-C's assistance was crucial throughout this case and helping with soft tissue management and retraction, guiding implant placement and a layered closure of the wound.

## 2023-02-03 NOTE — Telephone Encounter (Signed)
Patient Advocate Encounter   The patient was approved for a [employer] grant that will help cover the cost of FARXIGA AND ENTRESTO Total amount awarded, $10,000.  Effective: 01/04/23 - 01/03/24   BIN [id number] PCN [id number] [id number] ID [id number]   Pharmacy provided with approval and processing information. Patient informed via TELEPHONE, EMAIL  Haze Rushing, CPhT  Pharmacy Patient Advocate Specialist  Direct Number: [phone] Fax: [phone]

## 2023-02-03 NOTE — Progress Notes (Signed)
PT Cancellation Note  Patient Details Name: Lisa Mcclure MRN: [id number] DOB: 07-05-1932   Cancelled Treatment:    Reason Eval/Treat Not Completed: Patient not medically ready. Pt for IM nail today.    Angelina Ok [facility] 02/03/2023, 9:48 AM Skip Mayer PT Acute [employer] [phone]

## 2023-02-03 NOTE — Anesthesia Procedure Notes (Signed)
Procedure Name: Intubation Date/Time: 02/03/2023 4:01 PM  Performed by: Stanton Kidney, CRNAPre-anesthesia Checklist: Patient identified, Patient being monitored, Timeout performed, Emergency Drugs available and Suction available Patient Re-evaluated:Patient Re-evaluated prior to induction Oxygen Delivery Method: Circle system utilized Preoxygenation: Pre-oxygenation with 100% oxygen Induction Type: IV induction Ventilation: Mask ventilation without difficulty Laryngoscope Size: 3 and McGraph Grade View: Grade I Tube type: Oral Tube size: 7.0 mm Number of attempts: 1 Airway Equipment and Method: Stylet Placement Confirmation: ETT inserted through vocal cords under direct vision, positive ETCO2 and breath sounds checked- equal and bilateral Secured at: 22 cm Tube secured with: Tape Dental Injury: Teeth and Oropharynx as per pre-operative assessment

## 2023-02-03 NOTE — Progress Notes (Signed)
OT Cancellation Note  Patient Details Name: Lisa Mcclure MRN: [id number] DOB: 02-07-1933   Cancelled Treatment:    Reason Eval/Treat Not Completed: Patient not medically ready. Pt for IM nail today.  Lindon Romp OT Acute Rehabilitation Services Office [phone]    Evette Georges 02/03/2023, 7:48 AM

## 2023-02-03 NOTE — Progress Notes (Signed)
PHARMACY - ANTICOAGULATION CONSULT NOTE  Pharmacy Consult for IV heparin  Indication: atrial fibrillation  Allergies  Allergen Reactions   Statins Other (See Comments)    Muscle aches and INTERNAL BLEEDING   Atorvastatin Other (See Comments)    Myalgias   Gabapentin Other (See Comments)    "Made me loopy"   Methocarbamol Other (See Comments)    "Made me loopy"    Patient Measurements: Height: 5\' 3"  (160 cm) Weight: 90.7 kg (199 lb 15.3 oz) IBW/kg (Calculated) : 52.4 Heparin Dosing Weight: 73.1 kg   Vital Signs: Temp: 97.7 F (36.5 C) (10/25 0911) Temp Source: Oral (10/25 0911) BP: 155/70 (10/25 1200) Pulse Rate: 72 (10/25 1200)  Labs: Recent Labs    02/02/23 2142 02/03/23 0102 02/03/23 1257  HGB 13.7 13.9  --   HCT 43.2 44.1  --   PLT 272 259  --   APTT  --   --  54*  LABPROT 15.8*  --   --   INR 1.2  --   --   CREATININE 0.98 0.89  --     Estimated Creatinine Clearance: 47.6 mL/min (by C-G formula based on SCr of 0.89 mg/dL).   Medical History: Past Medical History:  Diagnosis Date   Acute renal failure superimposed on stage 3b chronic kidney disease (HCC) 06/15/2021   Anemia    h/o hemorrhoidal bleeding and blood transfusion   Chronic diastolic CHF (congestive heart failure) (HCC)    Depression    Diverticulosis    DOE (dyspnea on exertion)    Esophageal reflux    GERD (gastroesophageal reflux disease)    Hypothyroidism    Insomnia    LBP (low back pain)    OAB (overactive bladder)    Obesity    OSA (obstructive sleep apnea)    Osteoarthritis    Paroxysmal atrial fibrillation (HCC)    Rheumatoid arthritis(714.0)    Shoulder pain, bilateral    Unspecified essential hypertension    Venous insufficiency     Assessment: Lisa Mcclure is a 89 y.o. year old female admitted on 02/02/2023 with concern for hip fracture. Eliquis prior to admission for afib and hx LV thrombus (last dose 10/24 0900). On reduced dose per outpatient cardiologist of  eliquis due to bleeding risk. Pharmacy consulted to dose heparin.  Initial aPTT subtherapeutic on 1000 units/hr, currently on hold for OR   Goal of Therapy:  Heparin level 0.3-0.7 units/ml aPTT 66-102 seconds Monitor platelets by anticoagulation protocol: Yes   Plan:  Upon restart post-op increase heparin gtt to 1150 units/hr F/u 8 hour aPTT/HL s/p restart   [name], PharmD, [facility] Clinical Pharmacist ED Pharmacist Phone # [phone] 02/03/2023 2:21 PM

## 2023-02-03 NOTE — Transfer of Care (Signed)
Immediate Anesthesia Transfer of Care Note  Patient: [name]  Procedure(s) Performed: LEFT INTRAMEDULLARY (IM) NAIL INTERTROCHANTERIC (Left: Hip)  Patient Location: PACU  Anesthesia Type:General  Level of Consciousness: awake, oriented, and patient cooperative  Airway & Oxygen Therapy: Patient Spontanous Breathing and Patient connected to nasal cannula oxygen  Post-op Assessment: Report given to RN and Post -op Vital signs reviewed and stable  Post vital signs: Reviewed and stable  Last Vitals:  Vitals Value Taken Time  BP 182/76 02/03/23 1738  Temp    Pulse 68 02/03/23 1743  Resp 17 02/03/23 1743  SpO2 98 % 02/03/23 1743  Vitals shown include unfiled device data.  Last Pain:  Vitals:   02/03/23 1431  TempSrc: Oral  PainSc:          Complications: No notable events documented.

## 2023-02-03 NOTE — Telephone Encounter (Signed)
Please send scripts for Marcelline Deist and Sherryll Burger to [facility] - [place], [place] - [address]   Please include grant billing information in script comment ([id number]: [id number], [id number], [id number], [id number])

## 2023-02-03 NOTE — Progress Notes (Signed)
[name], female   DOB: Jul 15, 1932, 89 y.o.   MRN: [id number] I have seen the patient at the bedside and short stay and spoke to her and her daughter.  She has a very complex left hip intertrochanteric fracture.  We talked about the treatment of a cephalomedullary nail and screws and I described what that means.  I did have a long and thorough discussion about the risk and benefits of the surgery.  She has significant comorbidities with cardiac issues.  She has been on Eliquis and the last dose was yesterday.  She is having significant pain as a relates to this fracture.  The fracture is very unstable and without surgery she will certainly be bed ridden and in significant pain.  On exam her left leg is shortened and externally rotated and I did review her x-rays showing the complex nature of this fracture.  After a long thorough discussion with her and her daughter they do wish to proceed with surgery.  Informed consent has been obtained and the left operative hip has been marked.

## 2023-02-03 NOTE — Plan of Care (Signed)
Patient seen and evaluated this morning.  She does have a left intertrochanteric hip fracture with shortening and external rotation.  She has high risk as she does have cardiac history and pending aortic valve replacement.  I did communicate with the cardiology team who has said this but that being said no additional intervention has been recommended at this time.  She is now over 24 hours of taking her Eliquis.  I did discuss treatment options and ultimately I do believe she would benefit from left hip intertrochanteric nailing.  I have consulted with my partner Dr. Magnus Ivan who has some availability today to perform this.  To remain n.p.o. for left hip nailing

## 2023-02-03 NOTE — Anesthesia Preprocedure Evaluation (Addendum)
Anesthesia Evaluation  Patient identified by MRN, date of birth, ID band Patient awake    Reviewed: Allergy & Precautions, NPO status , Patient's Chart, lab work & pertinent test results  History of Anesthesia Complications Negative for: history of anesthetic complications  Airway Mallampati: III  TM Distance: >3 FB Neck ROM: Limited    Dental  (+) Dental Advisory Given   Pulmonary neg shortness of breath, sleep apnea , COPD (uses 2L O2 at night),  COPD inhaler and oxygen dependent, neg recent URI, former smoker   Pulmonary exam normal breath sounds clear to auscultation       Cardiovascular hypertension, (-) angina +CHF (EF 60-65%) and + DOE (gets SOB making the bed)  (-) Past MI, (-) Cardiac Stents and (-) CABG + dysrhythmias Atrial Fibrillation + Valvular Problems/Murmurs (mild MR, severe AS)  Rhythm:Regular Rate:Normal  TTE 01/20/2023: IMPRESSIONS    1. Left ventricular ejection fraction, by estimation, is 60 to 65%. The  left ventricle has normal function. The left ventricle has no regional  wall motion abnormalities. There is moderate concentric left ventricular  hypertrophy. Left ventricular diastolic parameters are indeterminate.   2. Right ventricular systolic function is normal. The right ventricular  size is normal.   3. Left atrial size was moderately dilated.   4. The mitral valve is degenerative. Mild mitral valve regurgitation. No  evidence of mitral stenosis.   5. The aortic valve is tricuspid. There is severe calcifcation of the  aortic valve. Aortic valve regurgitation is trivial. Moderate to severe  aortic valve stenosis. Aortic valve area, by VTI measures 0.95 cm. Aortic  valve mean gradient measures 19.5  mmHg. Aortic valve Vmax measures 2.96 m/s.   6. The inferior vena cava is normal in size with greater than 50%  respiratory variability, suggesting right atrial pressure of 3 mmHg.     Neuro/Psych neg  Seizures PSYCHIATRIC DISORDERS Anxiety Depression    negative neurological ROS     GI/Hepatic Neg liver ROS,GERD  ,,  Endo/Other  neg diabetesHypothyroidism    Renal/GU CRFRenal disease Bladder dysfunction      Musculoskeletal  (+) Arthritis , Rheumatoid disorders,    Abdominal  (+) + obese  Peds  Hematology  (+) Blood dyscrasia, anemia Lab Results      Component                Value               Date                      WBC                      15.4 (H)            02/03/2023                HGB                      13.9                02/03/2023                HCT                      44.1                02/03/2023  MCV                      96.5                02/03/2023                PLT                      259                 02/03/2023              Anesthesia Other Findings 89 y.o. female with medical history significant of heart failure with preserved EF, essential hypertension, paroxysmal atrial fibrillation, COPD, OSA, severe aortic stenosis, status post AAA repair in 12/2019 and pseudoaneurysm sac repair of AAA on 10/29/2022, left atrial thrombus on Eliquis, hypothyroidism and generalized anxiety disorder presented to emergency department for evaluation for fall.    Last Eliquis: 02/02/2023  Following with Dr. Jacinto Halim for possible heart cath and TAVR in 02/2023  Reproductive/Obstetrics                             Anesthesia Physical Anesthesia Plan  ASA: 4  Anesthesia Plan: General   Post-op Pain Management:    Induction: Intravenous  PONV Risk Score and Plan: 3 and Ondansetron, Dexamethasone and Treatment may vary due to age or medical condition  Airway Management Planned: Oral ETT  Additional Equipment: Arterial line  Intra-op Plan:   Post-operative Plan: Extubation in OR  Informed Consent: I have reviewed the patients History and Physical, chart, labs and discussed the procedure including the risks, benefits and  alternatives for the proposed anesthesia with the patient or authorized representative who has indicated his/her understanding and acceptance.     Dental advisory given  Plan Discussed with: CRNA and Anesthesiologist  Anesthesia Plan Comments: (Patient marked as full code this admission despite being DNR on previous admissions. Discussed with patient. She would like to remain full code for the procedure. Risks of general anesthesia discussed including, but not limited to, sore throat, hoarse voice, chipped/damaged teeth, injury to vocal cords, nausea and vomiting, allergic reactions, lung infection, heart attack, stroke, and death. Discussed increased risks of adverse cardiac events due to aortic stenosis. All questions answered. )        Anesthesia Quick Evaluation

## 2023-02-04 ENCOUNTER — Inpatient Hospital Stay (HOSPITAL_COMMUNITY): Payer: Medicare Other

## 2023-02-04 DIAGNOSIS — I10 Essential (primary) hypertension: Secondary | ICD-10-CM | POA: Diagnosis not present

## 2023-02-04 DIAGNOSIS — S72142A Displaced intertrochanteric fracture of left femur, initial encounter for closed fracture: Secondary | ICD-10-CM | POA: Diagnosis not present

## 2023-02-04 DIAGNOSIS — E039 Hypothyroidism, unspecified: Secondary | ICD-10-CM | POA: Diagnosis not present

## 2023-02-04 DIAGNOSIS — I350 Nonrheumatic aortic (valve) stenosis: Secondary | ICD-10-CM | POA: Diagnosis not present

## 2023-02-04 LAB — CBC
HCT: 36.8 % (ref 36.0–46.0)
Hemoglobin: 11.4 g/dL — ABNORMAL LOW (ref 12.0–15.0)
MCH: 29.9 pg (ref 26.0–34.0)
MCHC: 31 g/dL (ref 30.0–36.0)
MCV: 96.6 fL (ref 80.0–100.0)
Platelets: 238 10*3/uL (ref 150–400)
RBC: 3.81 MIL/uL — ABNORMAL LOW (ref 3.87–5.11)
RDW: 14.1 % (ref 11.5–15.5)
WBC: 14.2 10*3/uL — ABNORMAL HIGH (ref 4.0–10.5)
nRBC: 0 % (ref 0.0–0.2)

## 2023-02-04 MED ORDER — DIPHENHYDRAMINE HCL 25 MG PO CAPS
25.0000 mg | ORAL_CAPSULE | Freq: Four times a day (QID) | ORAL | Status: AC | PRN
  Administered 2023-02-04 – 2023-02-05 (×2): 25 mg via ORAL
  Filled 2023-02-04 (×2): qty 1

## 2023-02-04 MED ORDER — SODIUM CHLORIDE 0.9 % IV BOLUS
500.0000 mL | Freq: Once | INTRAVENOUS | Status: AC
  Administered 2023-02-04: 500 mL via INTRAVENOUS

## 2023-02-04 NOTE — Plan of Care (Signed)
  Problem: Clinical Measurements: Goal: Cardiovascular complication will be avoided Outcome: Progressing   Problem: Activity: Goal: Risk for activity intolerance will decrease Outcome: Progressing   Problem: Coping: Goal: Level of anxiety will decrease Outcome: Progressing   Problem: Pain Management: Goal: General experience of comfort will improve Outcome: Progressing

## 2023-02-04 NOTE — Evaluation (Signed)
Physical Therapy Evaluation Patient Details Name: Lisa Mcclure MRN: [id number] DOB: 05/02/1932 Today's Date: 02/04/2023  History of Present Illness  The pt is an 89 yo female presenting 10/24 after a fall at home, pt with L hip fx s/p ORIF 10/25.  PMH includes: chronic diastolic CHF, permanent atrial fibrillation on Eliquis, COPD, OSA on 2 L nasal cannula at night only, HTN, OSA, and aortic stenosis.   Clinical Impression  Pt in bed upon arrival of PT, agreeable to evaluation at this time. Prior to admission the pt was living at home alone, using a rollator and lift chair for safe mobility. She presents today with significant limitations in strength, ROM, activity tolerance, and stability, needing significant assist of 2 to complete sit-stand transfer and was unable to progress to stepping or gait at this time. Recommend continued post-acute therapies <3hours/day when medically stable for d/c.         If plan is discharge home, recommend the following: Two people to help with walking and/or transfers;Two people to help with bathing/dressing/bathroom;Assistance with cooking/housework;Assistance with feeding;Direct supervision/assist for medications management;Direct supervision/assist for financial management;Assist for transportation;Help with stairs or ramp for entrance   Can travel by private vehicle   No    Equipment Recommendations Wheelchair (measurements PT);BSC/3in1;Wheelchair cushion (measurements PT)  Recommendations for Other Services       Functional Status Assessment Patient has had a recent decline in their functional status and demonstrates the ability to make significant improvements in function in a reasonable and predictable amount of time.     Precautions / Restrictions Precautions Precautions: Fall Precaution Comments: watch SpO2 87-93% on RA Restrictions Weight Bearing Restrictions: Yes LLE Weight Bearing: Weight bearing as tolerated      Mobility  Bed  Mobility Overal bed mobility: Needs Assistance Bed Mobility: Supine to Sit     Supine to sit: Max assist, +2 for physical assistance, HOB elevated, Used rails     General bed mobility comments: pt attempting to move RLE and reaching for bedrails but limited ability to assist due to pain in LLE, bilateral frozen shoulder, and poor core strength. maxA of 2 to complete and use of bed pad to scoot    Transfers Overall transfer level: Needs assistance Equipment used: Rolling walker (2 wheels) Transfers: Sit to/from Stand Sit to Stand: Mod assist, +2 physical assistance, From elevated surface           General transfer comment: modA of 2 to rise to standing, poor power up to standing but once standing minA of 1 for balance with RW. no overt knee buckling    Ambulation/Gait             Pre-gait activities: standing wt shift General Gait Details: limited to small lateral movements, not even a full step as pt unable to bear wt through arms enough to offweight LLE. pt is able to advance LLE, but unable to move RLE.    Balance Overall balance assessment: Needs assistance Sitting-balance support: No upper extremity supported, Feet supported Sitting balance-Leahy Scale: Fair Sitting balance - Comments: progressed from UE support to supervision   Standing balance support: Bilateral upper extremity supported, During functional activity Standing balance-Leahy Scale: Poor Standing balance comment: dependent on BUE support and miNA                             Pertinent Vitals/Pain Pain Assessment Pain Assessment: 0-10 Pain Score: 5  Pain Location: L hip  Pain Descriptors / Indicators: Sore Pain Intervention(s): Limited activity within patient's tolerance, Monitored during session, Repositioned, Premedicated before session    Home Living Family/patient expects to be discharged to:: Private residence Living Arrangements: Alone Available Help at Discharge:  Neighbor;Available PRN/intermittently Type of Home: House Home Access: Level entry       Home Layout: One level Home Equipment: Agricultural consultant (2 wheels);Rollator (4 wheels);Cane - single point;Grab bars - tub/shower;Adaptive equipment;Toilet riser      Prior Function Prior Level of Function : Needs assist             Mobility Comments: ambulatory with rollator, reports increased difficulty recently with transfers due to lack of shoulder strength ADLs Comments: pt reports generally mod I, not driving, has aide on thursdays for errands and driving to doctor appointments     Extremity/Trunk Assessment   Upper Extremity Assessment Upper Extremity Assessment: Defer to OT evaluation (bilateral frozen shoulder)    Lower Extremity Assessment Lower Extremity Assessment: LLE deficits/detail LLE Deficits / Details: redness and edema in BLE, pt able to move at ankle, PROM at knee and hip due to pain. LLE: Unable to fully assess due to pain LLE Sensation: WNL LLE Coordination: WNL    Cervical / Trunk Assessment Cervical / Trunk Assessment: Kyphotic;Other exceptions Cervical / Trunk Exceptions: large body habitus  Communication   Communication Communication: No apparent difficulties Cueing Techniques: Verbal cues  Cognition Arousal: Alert Behavior During Therapy: WFL for tasks assessed/performed Overall Cognitive Status: No family/caregiver present to determine baseline cognitive functioning                                 General Comments: pt able to follow simple cues and answer questions about PLOF. tangential at times but redirectable. no family present to determine if at baseline and not formally assessed.        General Comments General comments (skin integrity, edema, etc.): SpO2 87-93% on RA. RN alerted        Assessment/Plan    PT Assessment Patient needs continued PT services  PT Problem List Decreased strength;Decreased range of motion;Decreased  activity tolerance;Decreased balance;Decreased mobility;Obesity;Pain       PT Treatment Interventions DME instruction;Gait training;Functional mobility training;Therapeutic activities;Therapeutic exercise;Balance training;Patient/family education    PT Goals (Current goals can be found in the Care Plan section)  Acute Rehab PT Goals Patient Stated Goal: return to walking without pain PT Goal Formulation: With patient Time For Goal Achievement: 02/18/23 Potential to Achieve Goals: Good    Frequency Min 1X/week     Co-evaluation PT/OT/SLP Co-Evaluation/Treatment: Yes Reason for Co-Treatment: Complexity of the patient's impairments (multi-system involvement);For patient/therapist safety;To address functional/ADL transfers PT goals addressed during session: Mobility/safety with mobility;Balance;Proper use of DME;Strengthening/ROM         AM-PAC PT "6 Clicks" Mobility  Outcome Measure Help needed turning from your back to your side while in a flat bed without using bedrails?: Total Help needed moving from lying on your back to sitting on the side of a flat bed without using bedrails?: Total Help needed moving to and from a bed to a chair (including a wheelchair)?: Total Help needed standing up from a chair using your arms (e.g., wheelchair or bedside chair)?: Total Help needed to walk in hospital room?: Total Help needed climbing 3-5 steps with a railing? : Total 6 Click Score: 6    End of Session Equipment Utilized During Treatment: Gait belt Activity  Tolerance: Patient limited by pain Patient left: in bed;with call bell/phone within reach;with bed alarm set Nurse Communication: Mobility status (SpO2) PT Visit Diagnosis: Unsteadiness on feet (R26.81);Other abnormalities of gait and mobility (R26.89);Muscle weakness (generalized) (M62.81);Pain Pain - Right/Left: Left Pain - part of body: Hip    Time: 1610-9604 PT Time Calculation (min) (ACUTE ONLY): 37 min   Charges:   PT  Evaluation $PT Eval Low Complexity: 1 Low   PT General Charges $$ ACUTE PT VISIT: 1 Visit         Vickki Muff, PT, DPT   Acute Rehabilitation Department Office [phone] Secure Chat Communication Preferred  Ronnie Derby 02/04/2023, 1:47 PM

## 2023-02-04 NOTE — Hospital Course (Addendum)
Lisa Mcclure is a 89 y.o. female with a history of heart failure, hypertension, paroxysmal atrial fibrillation, COPD, obstructive sleep apnea, severe aortic stenosis, history of AAA repair history of pseudoaneurysm sac repair of AAA, left atrial thrombus on Eliquis, hypothyroidism, generalized anxiety disorder.  Patient presented secondary to fall with resultant left hip pain and was found to have a left hip fracture.  Orthopedic surgery was consulted and performed ORIF.

## 2023-02-04 NOTE — Plan of Care (Signed)

## 2023-02-04 NOTE — Anesthesia Postprocedure Evaluation (Signed)
Anesthesia Post Note  Patient: Lisa Mcclure  Procedure(s) Performed: LEFT INTRAMEDULLARY (IM) NAIL INTERTROCHANTERIC (Left: Hip)     Patient location during evaluation: PACU Anesthesia Type: General Level of consciousness: sedated and patient cooperative Pain management: pain level controlled Vital Signs Assessment: post-procedure vital signs reviewed and stable Respiratory status: spontaneous breathing Cardiovascular status: stable Anesthetic complications: no   No notable events documented.  Last Vitals:  Vitals:   02/04/23 0721 02/04/23 0820  BP: (!) 113/56   Pulse: 75   Resp:    Temp: 36.6 C   SpO2: 97% 96%    Last Pain:  Vitals:   02/04/23 0721  TempSrc: Oral  PainSc:                  Lewie Loron

## 2023-02-04 NOTE — Progress Notes (Signed)
   Subjective:  Patient reports pain as moderate.   Very pleasant this morning. Has not had PT/OT yet.  Objective:   VITALS:   Vitals:   02/04/23 0421 02/04/23 0500 02/04/23 0721 02/04/23 0820  BP: (!) 107/54  (!) 113/56   Pulse: 73  75   Resp: (!) 23     Temp:   97.8 F (36.6 C)   TempSrc: Oral  Oral   SpO2: 98%  97% 96%  Weight:  97 kg    Height:        Upper surgical bandage with some pooled bloody drainage contained within the bandage NVI distally   Lab Results  Component Value Date   WBC 14.2 (H) 02/04/2023   HGB 11.4 (L) 02/04/2023   HCT 36.8 02/04/2023   MCV 96.6 02/04/2023   PLT 238 02/04/2023     Assessment/Plan:  1 Day Post-Op   - Expected postop acute blood loss anemia - Up with PT/OT - DVT ppx - SCDs, ambulation, resume eliquis - WBAT operative extremity - Pain control - will need SNF  Glee Arvin 02/04/2023, 10:27 AM

## 2023-02-04 NOTE — Plan of Care (Signed)
  Problem: Education: Goal: Knowledge of General Education information will improve Description: Including pain rating scale, medication(s)/side effects and non-pharmacologic comfort measures 02/04/2023 1856 by Mateo Flow, RN Outcome: Progressing 02/04/2023 1735 by Mateo Flow, RN Outcome: Progressing   Problem: Health Behavior/Discharge Planning: Goal: Ability to manage health-related needs will improve 02/04/2023 1856 by Mateo Flow, RN Outcome: Progressing 02/04/2023 1735 by Mateo Flow, RN Outcome: Progressing   Problem: Clinical Measurements: Goal: Ability to maintain clinical measurements within normal limits will improve 02/04/2023 1856 by Mateo Flow, RN Outcome: Progressing 02/04/2023 1735 by Mateo Flow, RN Outcome: Progressing Goal: Will remain free from infection 02/04/2023 1856 by Mateo Flow, RN Outcome: Progressing 02/04/2023 1735 by Mateo Flow, RN Outcome: Progressing Goal: Diagnostic test results will improve 02/04/2023 1856 by Mateo Flow, RN Outcome: Progressing 02/04/2023 1735 by Mateo Flow, RN Outcome: Progressing Goal: Respiratory complications will improve 02/04/2023 1856 by Mateo Flow, RN Outcome: Progressing 02/04/2023 1735 by Mateo Flow, RN Outcome: Progressing Goal: Cardiovascular complication will be avoided 02/04/2023 1856 by Mateo Flow, RN Outcome: Progressing 02/04/2023 1735 by Mateo Flow, RN Outcome: Progressing   Problem: Activity: Goal: Risk for activity intolerance will decrease 02/04/2023 1856 by Mateo Flow, RN Outcome: Progressing 02/04/2023 1735 by Mateo Flow, RN Outcome: Progressing   Problem: Nutrition: Goal: Adequate nutrition will be maintained 02/04/2023 1856 by Mateo Flow, RN Outcome: Progressing 02/04/2023 1735 by Mateo Flow, RN Outcome: Progressing   Problem: Coping: Goal: Level of anxiety will decrease Outcome:  Progressing   Problem: Elimination: Goal: Will not experience complications related to bowel motility Outcome: Progressing Goal: Will not experience complications related to urinary retention Outcome: Progressing   Problem: Pain Management: Goal: General experience of comfort will improve Outcome: Progressing   Problem: Safety: Goal: Ability to remain free from injury will improve Outcome: Progressing   Problem: Skin Integrity: Goal: Risk for impaired skin integrity will decrease Outcome: Progressing   Problem: Education: Goal: Verbalization of understanding the information provided (i.e., activity precautions, restrictions, etc) will improve Outcome: Progressing Goal: Individualized Educational Video(s) Outcome: Progressing   Problem: Activity: Goal: Ability to ambulate and perform ADLs will improve Outcome: Progressing   Problem: Clinical Measurements: Goal: Postoperative complications will be avoided or minimized Outcome: Progressing   Problem: Self-Concept: Goal: Ability to maintain and perform role responsibilities to the fullest extent possible will improve Outcome: Progressing   Problem: Pain Management: Goal: Pain level will decrease Outcome: Progressing

## 2023-02-04 NOTE — Plan of Care (Signed)
  Problem: Clinical Measurements: Goal: Respiratory complications will improve Outcome: Progressing   Problem: Clinical Measurements: Goal: Cardiovascular complication will be avoided Outcome: Progressing   Problem: Activity: Goal: Risk for activity intolerance will decrease Outcome: Progressing   Problem: Nutrition: Goal: Adequate nutrition will be maintained Outcome: Progressing   

## 2023-02-04 NOTE — Progress Notes (Signed)
PROGRESS NOTE    Lisa Mcclure  ZOX:[id number] DOB: 08/09/1932 DOA: 02/02/2023 PCP: Garlan Fillers, MD   Brief Narrative: Lisa Mcclure is a 89 y.o. female with a history of heart failure, hypertension, paroxysmal atrial fibrillation, COPD, obstructive sleep apnea, severe aortic stenosis, history of AAA repair history of pseudoaneurysm sac repair of AAA, left atrial thrombus on Eliquis, hypothyroidism, generalized anxiety disorder.  Patient presented secondary to fall with resultant left hip pain and was found to have a left hip fracture.  Orthopedic surgery was consulted and performed ORIF.   Assessment and Plan:  Displaced intertrochanteric fracture of the left femur Secondary to trip and fall.  No associated loss of consciousness.  Initial x-ray significant for evidence of a comminuted displaced intertrochanteric left proximal femur fracture.  Orthopedic surgery was consulted with plan for surgical repair.  Case discussed with Dr. Jacinto Halim who recommends to proceed with surgery secondary to high morbidity of unrepaired fracture. Orthopedic surgery performed ORIF on 10/25. -PT/OT -Discharge to SNF  Postoperative anemia Expected. Mild. Some blood noted under upper dressing. -Trend CBC  Chronic diastolic heart failure Stable. Patient is on furosemide, Entresto and spironolactone as an outpatient. -Continue Entresto, spironolactone and furosemide  Severe aortic stenosis Patient follows with cardiology with outpatient consideration for aortic valve replacement. Discussed with cardiology and there is no barrier to surgery regarding aortic stenosis when weighed against morbidity of no surgical fixation.  Paroxysmal atrial fibrillation Currently in sinus rhythm with ventricular trigeminy on EKG. Patient is managed on Eliquis for stroke risk reduction. She is not on rate or rhythm control. Eliquis transitioned to Heparin IV. Eliquis resumed. -Continue Eliquis  Left ventricular  thrombus Patient is managed on Eliquis as an outpatient which is held secondary to need for surgery. Patient transitioned to Heparin IV -Continue Heparin IV; transition back to Eliquis pending orthopedic surgery recommendations  Hypothyroidism -Continue Synthroid  COPD Stable and without exacerbation.  OSA Not on CPAP  DVT prophylaxis: Eliquis Code Status:   Code Status: Full Code Family Communication: Daughter and pastor at bedside Disposition Plan: Discharge likely to SNF in 3-5 days pending orthopedic surgery recommendations/management in addition to PT/OT recommendations   Consultants:  Orthopedic surgery  Procedures:  None  Antimicrobials: None    Subjective: No specific concerns today. Worked with physical therapy and was able to get on her feet.  Objective: BP (!) 92/47 (BP Location: Left Arm)   Pulse 93   Temp 98.1 F (36.7 C) (Oral)   Resp (!) 23   Ht 5\' 3"  (1.6 m)   Wt 97 kg   SpO2 91%   BMI 37.88 kg/m   Examination:  General exam: Appears calm and comfortable Respiratory system: Clear to auscultation. Respiratory effort normal. Cardiovascular system: S1 & S2 heard Gastrointestinal system: Abdomen is nondistended, soft and nontender. No organomegaly or masses felt. Normal bowel sounds heard. Central nervous system: Alert and oriented. No focal neurological deficits. Psychiatry: Judgement and insight appear normal. Mood & affect appropriate.    Data Reviewed: I have personally reviewed following labs and imaging studies  CBC Lab Results  Component Value Date   WBC 14.2 (H) 02/04/2023   RBC 3.81 (L) 02/04/2023   HGB 11.4 (L) 02/04/2023   HCT 36.8 02/04/2023   MCV 96.6 02/04/2023   MCH 29.9 02/04/2023   PLT 238 02/04/2023   MCHC 31.0 02/04/2023   RDW 14.1 02/04/2023   LYMPHSABS 0.9 02/02/2023   MONOABS 1.0 02/02/2023   EOSABS 0.1 02/02/2023  BASOSABS 0.0 02/02/2023     Last metabolic panel Lab Results  Component Value Date   NA 138  02/03/2023   K 3.9 02/03/2023   CL 100 02/03/2023   CO2 26 02/03/2023   BUN 25 (H) 02/03/2023   CREATININE 0.89 02/03/2023   GLUCOSE 98 02/03/2023   GFRNONAA >60 02/03/2023   GFRAA 41 (L) 04/30/2020   CALCIUM 8.8 (L) 02/03/2023   PHOS 5.1 (H) 06/20/2022   PROT 7.4 02/03/2023   ALBUMIN 3.6 02/03/2023   BILITOT 0.9 02/03/2023   ALKPHOS 85 02/03/2023   AST 19 02/03/2023   ALT 12 02/03/2023   ANIONGAP 12 02/03/2023    GFR: Estimated Creatinine Clearance: 49.4 mL/min (by C-G formula based on SCr of 0.89 mg/dL).  Recent Results (from the past 240 hour(s))  Surgical pcr screen     Status: None   Collection Time: 02/03/23  2:16 PM   Specimen: Nasal Mucosa; Nasal Swab  Result Value Ref Range Status   MRSA, PCR NEGATIVE NEGATIVE Final   Staphylococcus aureus NEGATIVE NEGATIVE Final    Comment: (NOTE) The Xpert SA Assay (FDA approved for NASAL specimens in patients 63 years of age and older), is one component of a comprehensive surveillance program. It is not intended to diagnose infection nor to guide or monitor treatment. Performed at [facility], [address], [place], [place] [address]       Radiology Studies: DG HIP UNILAT WITH PELVIS 2-3 VIEWS LEFT  Result Date: 02/03/2023 CLINICAL DATA:  Left intramedullary nail. EXAM: DG HIP (WITH OR WITHOUT PELVIS) 2-3V LEFT COMPARISON:  02/02/2023. FINDINGS: Seven fluoroscopic images were obtained intraoperatively. Total fluoroscopy time is 2 minutes and 24.8 seconds. Dose: 45.30 mGy. There has been interval placement of an intramedullary rod and nail with improved alignment intertrochanteric fracture. Partial knee arthroplasty changes are seen in the medial compartment. Please see operative report for additional information. IMPRESSION: Intraoperative utilization of fluoroscopy. Electronically Signed   By: Thornell Sartorius M.D.   On: 02/03/2023 21:21   DG C-Arm 1-60 Min-No Report  Result Date: 02/03/2023 Fluoroscopy was  utilized by the requesting physician.  No radiographic interpretation.   DG C-Arm 1-60 Min-No Report  Result Date: 02/03/2023 Fluoroscopy was utilized by the requesting physician.  No radiographic interpretation.   DG Chest Port 1 View  Result Date: 02/02/2023 CLINICAL DATA:  Fall with left hip fracture. EXAM: PORTABLE CHEST 1 VIEW COMPARISON:  11/29/2022 FINDINGS: Chronic cardiomegaly. Unchanged mediastinal contours. Aortic atherosclerosis. Chronic interstitial coarsening without focal airspace disease. No pleural effusion or pneumothorax. Advanced chronic shoulder arthropathy. IMPRESSION: Chronic cardiomegaly and interstitial coarsening. No acute chest findings. Electronically Signed   By: Narda Rutherford M.D.   On: 02/02/2023 21:44   DG Hip Unilat With Pelvis 2-3 Views Left  Result Date: 02/02/2023 CLINICAL DATA:  Fall with left leg pain. EXAM: DG HIP (WITH OR WITHOUT PELVIS) 2-3V LEFT COMPARISON:  None Available. FINDINGS: Comminuted displaced intertrochanteric left proximal femur fracture mild proximal migration of the femoral shaft. The femoral head is well seated. No hip dislocation. Bilateral narrowing hip osteoarthritis. Bony pelvis is intact. No rami fractures. No pubic symphyseal or sacroiliac diastasis. Aorto bi-iliac stent graft partially included in the field of view. Suspect prior bladder tack. IMPRESSION: Comminuted displaced intertrochanteric left proximal femur fracture. Electronically Signed   By: Narda Rutherford M.D.   On: 02/02/2023 21:41      LOS: 2 days    Jacquelin Hawking, MD [facility] 02/04/2023, 4:48 PM  If 7PM-7AM, please contact night-coverage [email or web address]

## 2023-02-04 NOTE — Evaluation (Signed)
Occupational Therapy Evaluation Patient Details Name: Lisa Mcclure MRN: [id number] DOB: [birth year] Today's Date: 02/04/2023   History of Present Illness The pt is an 89 yo female presenting 10/24 after a fall at home, pt with L hip fx s/p ORIF 10/25.  PMH includes: chronic diastolic CHF, permanent atrial fibrillation on Eliquis, COPD, OSA on 2 L nasal cannula at night only, HTN, OSA, and aortic stenosis.   Clinical Impression   PTA, pt was living at home alone, she reports she was typically modified independent and has an aide 1x/week to assist with errands and driving. Pt currently requires maxA+2 for bed mobility and modA+2 for sit<>stand transfer and attempt to side step along EOB, pt was limited secondary to pain. Pt requires maxA for LB dressing, setup assist for grooming/UB ADL. Due to limitations with strength, stability, activity tolerance and overall independence with ADL/IADL and functional mobility completion, patient will benefit from continued inpatient follow up therapy, <3 hours/day. Will continue to follow acutely.        If plan is discharge home, recommend the following: A lot of help with walking and/or transfers;A lot of help with bathing/dressing/bathroom    Functional Status Assessment  Patient has had a recent decline in their functional status and demonstrates the ability to make significant improvements in function in a reasonable and predictable amount of time.  Equipment Recommendations  Other (comment) (defer to next venue)    Recommendations for Other Services       Precautions / Restrictions Precautions Precautions: Fall Precaution Comments: watch SpO2 87-93% on RA Restrictions Weight Bearing Restrictions: Yes LLE Weight Bearing: Weight bearing as tolerated      Mobility Bed Mobility Overal bed mobility: Needs Assistance Bed Mobility: Supine to Sit     Supine to sit: Max assist, +2 for physical assistance, HOB elevated, Used rails      General bed mobility comments: pt attempting to move RLE and reaching for bedrails but limited ability to assist due to pain in LLE, bilateral frozen shoulder, and poor core strength. maxA of 2 to complete and use of bed pad to scoot    Transfers Overall transfer level: Needs assistance Equipment used: Rolling walker (2 wheels) Transfers: Sit to/from Stand Sit to Stand: Mod assist, +2 physical assistance, From elevated surface           General transfer comment: modA of 2 to rise to standing, poor power up to standing but once standing minA of 1 for balance with RW. no overt knee buckling      Balance Overall balance assessment: Needs assistance Sitting-balance support: No upper extremity supported, Feet supported Sitting balance-Leahy Scale: Fair Sitting balance - Comments: progressed from UE support to supervision   Standing balance support: Bilateral upper extremity supported, During functional activity Standing balance-Leahy Scale: Poor Standing balance comment: dependent on BUE support and miNA                           ADL either performed or assessed with clinical judgement   ADL Overall ADL's : Needs assistance/impaired Eating/Feeding: Set up;Sitting   Grooming: Set up;Sitting   Upper Body Bathing: Set up;Sitting   Lower Body Bathing: Moderate assistance;Sit to/from stand   Upper Body Dressing : Minimal assistance;Sitting   Lower Body Dressing: Moderate assistance;Sit to/from stand   Toilet Transfer: Moderate assistance;Rolling walker (2 wheels);+2 for physical assistance;+2 for safety/equipment Toilet Transfer Details (indicate cue type and reason): lateral stepping along EOB Toileting-  Clothing Manipulation and Hygiene: Moderate assistance;Sit to/from stand;+2 for physical assistance;+2 for safety/equipment       Functional mobility during ADLs: Moderate assistance;Rolling walker (2 wheels);+2 for physical assistance;+2 for  safety/equipment General ADL Comments: pt limited by pain     Vision         Perception         Praxis         Pertinent Vitals/Pain Pain Assessment Pain Assessment: 0-10 Pain Score: 5  Pain Location: L hip Pain Descriptors / Indicators: Sore Pain Intervention(s): Limited activity within patient's tolerance, Monitored during session     Extremity/Trunk Assessment Upper Extremity Assessment Upper Extremity Assessment: RUE deficits/detail;LUE deficits/detail RUE Deficits / Details: bilateral frozen shoulders limited ROM due to pain otherwise WFL with generalized weakness LUE Deficits / Details: bilateral frozen shoulders limited ROM due to pain otherwise WFL with generalized weakness   Lower Extremity Assessment Lower Extremity Assessment: Defer to PT evaluation LLE Deficits / Details: redness and edema in BLE, pt able to move at ankle, PROM at knee and hip due to pain. LLE: Unable to fully assess due to pain LLE Sensation: WNL LLE Coordination: WNL   Cervical / Trunk Assessment Cervical / Trunk Assessment: Kyphotic;Other exceptions Cervical / Trunk Exceptions: large body habitus   Communication Communication Communication: No apparent difficulties   Cognition Arousal: Alert Behavior During Therapy: WFL for tasks assessed/performed Overall Cognitive Status: No family/caregiver present to determine baseline cognitive functioning                                 General Comments: pt able to follow simple cues and answer questions about PLOF. tangential at times but redirectable. no family present to determine if at baseline and not formally assessed.     General Comments  SpO2 87-93% on RA. RN alerted    Exercises     Shoulder Instructions      Home Living Family/patient expects to be discharged to:: Private residence Living Arrangements: Alone Available Help at Discharge: Neighbor;Available PRN/intermittently Type of Home: House Home Access:  Level entry     Home Layout: One level     Bathroom Shower/Tub: Producer, television/film/video: Handicapped height Bathroom Accessibility: Yes   Home Equipment: Agricultural consultant (2 wheels);Rollator (4 wheels);Cane - single point;Grab bars - tub/shower;Adaptive equipment;Toilet riser Adaptive Equipment: Reacher        Prior Functioning/Environment Prior Level of Function : Needs assist             Mobility Comments: ambulatory with rollator, reports increased difficulty recently with transfers due to lack of shoulder strength ADLs Comments: pt reports generally mod I, not driving, has aide on thursdays for errands and driving to doctor appointments        OT Problem List: Decreased activity tolerance;Impaired balance (sitting and/or standing);Decreased safety awareness;Decreased knowledge of use of DME or AE;Decreased knowledge of precautions;Pain      OT Treatment/Interventions: Self-care/ADL training;Therapeutic exercise;DME and/or AE instruction;Therapeutic activities;Patient/family education;Balance training    OT Goals(Current goals can be found in the care plan section) Acute Rehab OT Goals Patient Stated Goal: to get stronger OT Goal Formulation: With patient Time For Goal Achievement: 02/18/23 Potential to Achieve Goals: Good ADL Goals Pt Will Perform Lower Body Dressing: sit to/from stand;with adaptive equipment;with min assist Pt Will Transfer to Toilet: ambulating;with min assist Additional ADL Goal #1: Pt will demonstrate independence with hip precautions. Additional ADL Goal #  2: Pt will complete bed mobility with moderate assistance in preparation for ADL.  OT Frequency: Min 2X/week    Co-evaluation   Reason for Co-Treatment: Complexity of the patient's impairments (multi-system involvement);For patient/therapist safety;To address functional/ADL transfers PT goals addressed during session: Mobility/safety with mobility;Balance;Proper use of  DME;Strengthening/ROM        AM-PAC OT "6 Clicks" Daily Activity     Outcome Measure Help from another person eating meals?: A Little Help from another person taking care of personal grooming?: A Little Help from another person toileting, which includes using toliet, bedpan, or urinal?: A Lot Help from another person bathing (including washing, rinsing, drying)?: A Lot Help from another person to put on and taking off regular upper body clothing?: A Little Help from another person to put on and taking off regular lower body clothing?: A Lot 6 Click Score: 15   End of Session Equipment Utilized During Treatment: Gait belt;Rolling walker (2 wheels) Nurse Communication: Mobility status  Activity Tolerance: Patient tolerated treatment well;Patient limited by pain Patient left: in bed;with call bell/phone within reach;with bed alarm set  OT Visit Diagnosis: Other abnormalities of gait and mobility (R26.89);Muscle weakness (generalized) (M62.81);History of falling (Z91.81);Pain Pain - Right/Left: Left Pain - part of body: Hip                Time: 6644-0347 OT Time Calculation (min): 38 min Charges:  OT General Charges $OT Visit: 1 Visit OT Evaluation $OT Eval Moderate Complexity: 1 Mod  Paquita Printy OTR/L Acute Rehabilitation Services Office: [phone]   Providence Crosby 02/04/2023, 3:33 PM

## 2023-02-05 DIAGNOSIS — S72142A Displaced intertrochanteric fracture of left femur, initial encounter for closed fracture: Secondary | ICD-10-CM | POA: Diagnosis not present

## 2023-02-05 DIAGNOSIS — E039 Hypothyroidism, unspecified: Secondary | ICD-10-CM | POA: Diagnosis not present

## 2023-02-05 DIAGNOSIS — I350 Nonrheumatic aortic (valve) stenosis: Secondary | ICD-10-CM | POA: Diagnosis not present

## 2023-02-05 DIAGNOSIS — I10 Essential (primary) hypertension: Secondary | ICD-10-CM | POA: Diagnosis not present

## 2023-02-05 LAB — CBC
HCT: 33.6 % — ABNORMAL LOW (ref 36.0–46.0)
Hemoglobin: 10.3 g/dL — ABNORMAL LOW (ref 12.0–15.0)
MCH: 30 pg (ref 26.0–34.0)
MCHC: 30.7 g/dL (ref 30.0–36.0)
MCV: 98 fL (ref 80.0–100.0)
Platelets: 200 10*3/uL (ref 150–400)
RBC: 3.43 MIL/uL — ABNORMAL LOW (ref 3.87–5.11)
RDW: 14.2 % (ref 11.5–15.5)
WBC: 11.6 10*3/uL — ABNORMAL HIGH (ref 4.0–10.5)
nRBC: 0 % (ref 0.0–0.2)

## 2023-02-05 MED ORDER — SODIUM CHLORIDE 0.9 % IV BOLUS
500.0000 mL | Freq: Once | INTRAVENOUS | Status: AC
  Administered 2023-02-05: 500 mL via INTRAVENOUS

## 2023-02-05 MED ORDER — GUAIFENESIN ER 600 MG PO TB12
600.0000 mg | ORAL_TABLET | Freq: Two times a day (BID) | ORAL | Status: DC
  Administered 2023-02-05 – 2023-02-07 (×4): 600 mg via ORAL
  Filled 2023-02-05 (×5): qty 1

## 2023-02-05 NOTE — Progress Notes (Signed)
RT placed pt on overnight pulse oximetry study.

## 2023-02-05 NOTE — Progress Notes (Addendum)
PROGRESS NOTE    Lisa Mcclure  RUE:[id number] DOB: [birth year] DOA: 02/02/2023 PCP: Garlan Fillers, MD   Brief Narrative: Lisa Mcclure is a 89 y.o. female with a history of heart failure, hypertension, paroxysmal atrial fibrillation, COPD, obstructive sleep apnea, severe aortic stenosis, history of AAA repair history of pseudoaneurysm sac repair of AAA, left atrial thrombus on Eliquis, hypothyroidism, generalized anxiety disorder.  Patient presented secondary to fall with resultant left hip pain and was found to have a left hip fracture.  Orthopedic surgery was consulted and performed ORIF.   Assessment and Plan:  Displaced intertrochanteric fracture of the left femur Secondary to trip and fall.  No associated loss of consciousness.  Initial x-ray significant for evidence of a comminuted displaced intertrochanteric left proximal femur fracture.  Orthopedic surgery was consulted with plan for surgical repair.  Case discussed with Dr. Jacinto Halim who recommends to proceed with surgery secondary to high morbidity of unrepaired fracture. Orthopedic surgery performed ORIF on 10/25. -PT/OT  Postoperative anemia Expected. Mild. Some blood noted under upper dressing. Pre-operative hemoglobin of 13.9 g/dL. Hemoglobin down to 10.3 g/dL today. -Trend CBC  Chronic diastolic heart failure Stable. Patient is on furosemide, Entresto and spironolactone as an outpatient. -Hold Entresto, spironolactone and furosemide this morning secondary to hypotension overnight  Hypotension In setting of antihypertensives, recent anesthesia, scheduled narcotics and known aortic stenosis. -Will hold antihypertensives this morning and trend blood pressure -Recommend discontinuing scheduled narcotics  Severe aortic stenosis Patient follows with cardiology with outpatient consideration for aortic valve replacement. Discussed with cardiology and there is no barrier to surgery regarding aortic stenosis when  weighed against morbidity of no surgical fixation.  Paroxysmal atrial fibrillation Currently in sinus rhythm with ventricular trigeminy on EKG. Patient is managed on Eliquis for stroke risk reduction. She is not on rate or rhythm control. Eliquis transitioned to Heparin IV. Eliquis resumed. -Continue Eliquis  Left ventricular thrombus Patient is managed on Eliquis as an outpatient which is held secondary to need for surgery. Patient transitioned to Heparin IV -Continue Eliquis  Hypothyroidism -Continue Synthroid  COPD Stable and without exacerbation.  OSA Not on CPAP. Patient uses nocturnal oxygen. -Overnight pulse ox  DVT prophylaxis: Eliquis Code Status:   Code Status: Full Code Family Communication: None at bedside Disposition Plan: Discharge likely to SNF in 1-2 days pending orthopedic surgery recommendations/management in addition to PT/OT recommendations and stable hemoglobin   Consultants:  Orthopedic surgery  Procedures:  10/25: ORIF  Antimicrobials: None    Subjective: No specific issues this morning except she has noticed her legs are sensitive/tender.  Objective: BP (!) 119/41   Pulse 77   Temp 98 F (36.7 C) (Oral)   Resp 16   Ht 5\' 3"  (1.6 m)   Wt 97 kg   SpO2 100%   BMI 37.88 kg/m   Examination:  General exam: Appears calm and comfortable Respiratory system: Clear to auscultation. Respiratory effort normal. Cardiovascular system: S1 & S2 heard, bigeminy, normal rate. 2/6 systolic murmur Gastrointestinal system: Abdomen is nondistended, soft and nontender. Normal bowel sounds heard. Central nervous system: Alert and oriented. No focal neurological deficits. Musculoskeletal: No edema. No calf tenderness Skin: No cyanosis. No rashes. Incision dressings intact with upper dressing showing evidence of underlying bleeding but is stable Psychiatry: Judgement and insight appear normal. Mood & affect appropriate.    Data Reviewed: I have personally  reviewed following labs and imaging studies  CBC Lab Results  Component Value Date   WBC 11.6 (  H) 02/05/2023   RBC 3.43 (L) 02/05/2023   HGB 10.3 (L) 02/05/2023   HCT 33.6 (L) 02/05/2023   MCV 98.0 02/05/2023   MCH 30.0 02/05/2023   PLT 200 02/05/2023   MCHC 30.7 02/05/2023   RDW 14.2 02/05/2023   LYMPHSABS 0.9 02/02/2023   MONOABS 1.0 02/02/2023   EOSABS 0.1 02/02/2023   BASOSABS 0.0 02/02/2023     Last metabolic panel Lab Results  Component Value Date   NA 138 02/03/2023   K 3.9 02/03/2023   CL 100 02/03/2023   CO2 26 02/03/2023   BUN 25 (H) 02/03/2023   CREATININE 0.89 02/03/2023   GLUCOSE 98 02/03/2023   GFRNONAA >60 02/03/2023   GFRAA 41 (L) 04/30/2020   CALCIUM 8.8 (L) 02/03/2023   PHOS 5.1 (H) 06/20/2022   PROT 7.4 02/03/2023   ALBUMIN 3.6 02/03/2023   BILITOT 0.9 02/03/2023   ALKPHOS 85 02/03/2023   AST 19 02/03/2023   ALT 12 02/03/2023   ANIONGAP 12 02/03/2023    GFR: Estimated Creatinine Clearance: 49.4 mL/min (by C-G formula based on SCr of 0.89 mg/dL).  Recent Results (from the past 240 hour(s))  Surgical pcr screen     Status: None   Collection Time: 02/03/23  2:16 PM   Specimen: Nasal Mucosa; Nasal Swab  Result Value Ref Range Status   MRSA, PCR NEGATIVE NEGATIVE Final   Staphylococcus aureus NEGATIVE NEGATIVE Final    Comment: (NOTE) The Xpert SA Assay (FDA approved for NASAL specimens in patients 45 years of age and older), is one component of a comprehensive surveillance program. It is not intended to diagnose infection nor to guide or monitor treatment. Performed at [facility], [address], [place], [place] [address]       Radiology Studies: CT HEAD WO CONTRAST ( )  Result Date: 02/04/2023 CLINICAL DATA:  Head trauma, minor EXAM: CT HEAD WITHOUT CONTRAST TECHNIQUE: Contiguous axial images were obtained from the base of the skull through the vertex without intravenous contrast. RADIATION DOSE REDUCTION: This exam was  performed according to the departmental dose-optimization program which includes automated exposure control, adjustment of the mA and/or kV according to patient size and/or use of iterative reconstruction technique. COMPARISON:  CT head without contrast 03/06/2015 FINDINGS: Brain: No acute infarct, hemorrhage, or mass lesion is present. No significant white matter lesions are present. Deep brain nuclei are within normal limits. The ventricles are of normal size. No significant extraaxial fluid collection is present. The brainstem and cerebellum are within normal limits. Vascular: Atherosclerotic calcifications are present within the cavernous internal carotid arteries bilaterally. No hyperdense vessel is present. Skull: Calvarium is intact. No focal lytic or blastic lesions are present. No significant extracranial soft tissue lesion is present. Sinuses/Orbits: The paranasal sinuses and mastoid air cells are clear. Bilateral lens replacements are noted. Globes and orbits are otherwise unremarkable. IMPRESSION: Negative CT of the brain. No acute or focal lesion to explain the patient's symptoms. Electronically Signed   By: Marin Roberts M.D.   On: 02/04/2023 17:36   DG HIP UNILAT WITH PELVIS 2-3 VIEWS LEFT  Result Date: 02/03/2023 CLINICAL DATA:  Left intramedullary nail. EXAM: DG HIP (WITH OR WITHOUT PELVIS) 2-3V LEFT COMPARISON:  02/02/2023. FINDINGS: Seven fluoroscopic images were obtained intraoperatively. Total fluoroscopy time is 2 minutes and 24.8 seconds. Dose: 45.30 mGy. There has been interval placement of an intramedullary rod and nail with improved alignment intertrochanteric fracture. Partial knee arthroplasty changes are seen in the medial compartment. Please see  operative report for additional information. IMPRESSION: Intraoperative utilization of fluoroscopy. Electronically Signed   By: Thornell Sartorius M.D.   On: 02/03/2023 21:21   DG C-Arm 1-60 Min-No Report  Result Date:  02/03/2023 Fluoroscopy was utilized by the requesting physician.  No radiographic interpretation.   DG C-Arm 1-60 Min-No Report  Result Date: 02/03/2023 Fluoroscopy was utilized by the requesting physician.  No radiographic interpretation.      LOS: 3 days    Jacquelin Hawking, MD [facility] 02/05/2023, 8:34 AM   If 7PM-7AM, please contact night-coverage [email or web address]

## 2023-02-05 NOTE — NC FL2 (Addendum)
[place] MEDICAID FL2 LEVEL OF CARE FORM     IDENTIFICATION  Patient Name: Lisa Mcclure Birthdate: 1932-11-10 Sex: female Admission Date (Current Location): 02/02/2023  [facility] and [place] Number:  Producer, television/film/video and Address:  [facility] [place], [address] [place], [place] [address]      Provider Number:    Attending Physician Name and Address:  Narda Bonds, MD  Relative Name and Phone Number:  Risa Grill daughter [phone]    Current Level of Care: Hospital Recommended Level of Care: Skilled Nursing Facility Prior Approval Number:    Date Approved/Denied:   PASRR Number: [id number]  Discharge Plan: SNF    Current Diagnoses: Patient Active Problem List   Diagnosis Date Noted   Closed comminuted intertrochanteric fracture of proximal end of left femur (HCC) 02/02/2023   S/P AAA repair 02/02/2023   Left atrial thrombus 02/02/2023   Closed left hip fracture (HCC) 02/02/2023   DNR (do not resuscitate) 01/25/2023   CHF exacerbation (HCC) 11/29/2022   Acute exacerbation of CHF (congestive heart failure) (HCC) 06/19/2022   Chronic diastolic CHF (congestive heart failure) (HCC) 04/25/2022   Chronic renal failure, stage 3b (HCC) 04/25/2022   Class 2 obesity 04/25/2022   Persistent atrial fibrillation (HCC) 04/22/2022   Chronic cough 03/28/2022   Acute hypoxemic respiratory failure (HCC) 06/15/2021   Paroxysmal atrial fibrillation (HCC) 06/15/2021   Acute renal failure with acute tubular necrosis superimposed on stage 3b chronic kidney disease (HCC) 06/15/2021   Generalized weakness 06/15/2021   Atrial fibrillation with RVR (HCC)    Long term (current) use of anticoagulants    Long term current use of antiarrhythmic drug    AAA (abdominal aortic aneurysm) without rupture (HCC) 12/20/2019   AAA (abdominal aortic aneurysm) (HCC) 12/20/2019   Intolerance of continuous positive airway pressure (CPAP) ventilation 11/26/2019    Severe hypoxemia 08/21/2019   Severe obstructive sleep apnea 07/29/2019   COPD (chronic obstructive pulmonary disease) (HCC) 07/29/2019   RLS (restless legs syndrome) 07/29/2019   Dehydration with hyponatremia 09/13/2018   Bradycardia 09/13/2018   Glaucoma 09/13/2018   OA (osteoarthritis) of shoulder 05/16/2018   Incomplete emptying of bladder 05/17/2017   Labial adhesions 05/17/2017   Mixed stress and urge urinary incontinence 05/17/2017   Urethral sphincter deficiency, intrinsic (ISD) 05/17/2017   Severe aortic stenosis 01/15/2016   Bilateral carotid bruits 05/11/2015   Hyponatremia 04/19/2015   Symptomatic anemia 04/19/2015   Panic attack 04/19/2015   Constipation 04/19/2015   Hyperkalemia 04/17/2015   Influenza A 04/16/2015   Proximal humerus fracture    Left patella fracture    Benign essential HTN    Esophageal reflux    Chronic heart failure with preserved ejection fraction (HFpEF) (HCC)    Hypothyroidism    Lower GI bleed 04/08/2015   Hematochezia    Rectal bleeding 04/03/2015   Patellar fracture 03/06/2015   Left humeral fracture 11/04/2013   Rib fracture 11/04/2013   Hypothyroid 11/04/2013   Essential hypertension 11/04/2013   Right shoulder pain 11/04/2013   OSA (obstructive sleep apnea) 11/13/2008   BOOP (bronchiolitis obliterans with organizing pneumonia) (HCC) 08/25/2008    Orientation RESPIRATION BLADDER Height & Weight     Self, Time, Situation, Place    Incontinent Weight: 213 lb 13.5 oz (97 kg) Height:  5\' 3"  (160 cm)  BEHAVIORAL SYMPTOMS/MOOD NEUROLOGICAL BOWEL NUTRITION STATUS      Continent Diet  AMBULATORY STATUS COMMUNICATION OF NEEDS Skin   Extensive Assist Verbally  Normal                       Personal Care Assistance Level of Assistance  Bathing, Feeding, Dressing Bathing Assistance: Maximum assistance Feeding assistance: Independent Dressing Assistance: Maximum assistance     Functional Limitations Info  Sight, Hearing, Speech  Sight Info: Impaired (wears glassess) Hearing Info: Adequate Speech Info: Adequate    SPECIAL CARE FACTORS FREQUENCY  PT (By licensed PT), OT (By licensed OT)     PT Frequency: 5x per week OT Frequency: 5x per week            Contractures      Additional Factors Info  Code Status, Allergies Code Status Info: full code Allergies Info: atorvastatin, gabapentin, methocabamol           Current Medications (02/05/2023):  This is the current hospital active medication list Current Facility-Administered Medications  Medication Dose Route Frequency Provider Last Rate Last Admin   acetaminophen (TYLENOL) tablet 650 mg  650 mg Oral Q6H PRN Kathryne Hitch, MD   650 mg at 02/03/23 1212   Or   acetaminophen (TYLENOL) suppository 650 mg  650 mg Rectal Q6H PRN Kathryne Hitch, MD       apixaban Everlene Balls) tablet 2.5 mg  2.5 mg Oral BID Kathryne Hitch, MD   2.5 mg at 02/05/23 1005   budesonide (PULMICORT) nebulizer solution 0.5 mg  0.5 mg Nebulization BID Kathryne Hitch, MD   0.5 mg at 02/05/23 1004   busPIRone (BUSPAR) tablet 7.5 mg  7.5 mg Oral QHS Kathryne Hitch, MD   7.5 mg at 02/04/23 2119   busPIRone (BUSPAR) tablet 7.5 mg  7.5 mg Oral Daily PRN Kathryne Hitch, MD       diphenhydrAMINE (BENADRYL) capsule 25 mg  25 mg Oral Q6H PRN Narda Bonds, MD   25 mg at 02/04/23 1920   docusate sodium (COLACE) capsule 100 mg  100 mg Oral BID Kathryne Hitch, MD   100 mg at 02/05/23 1004   HYDROmorphone (DILAUDID) injection 0.5-1 mg  0.5-1 mg Intravenous Q4H PRN Kathryne Hitch, MD   0.5 mg at 02/05/23 1145   labetalol (NORMODYNE) injection 10 mg  10 mg Intravenous Q8H PRN Kathryne Hitch, MD       latanoprost (XALATAN) 0.005 % ophthalmic solution 1 drop  1 drop Both Eyes QHS Kathryne Hitch, MD   1 drop at 02/04/23 2151   levothyroxine (SYNTHROID) tablet 125 mcg  125 mcg Oral QAC breakfast Kathryne Hitch, MD   125 mcg at 02/05/23 0545   melatonin tablet 3 mg  3 mg Oral QHS Kathryne Hitch, MD   3 mg at 02/04/23 2118   menthol-cetylpyridinium (CEPACOL) lozenge 3 mg  1 lozenge Oral PRN Kathryne Hitch, MD       Or   phenol (CHLORASEPTIC) mouth spray 1 spray  1 spray Mouth/Throat PRN Kathryne Hitch, MD       metoCLOPramide (REGLAN) tablet 5-10 mg  5-10 mg Oral Q8H PRN Kathryne Hitch, MD       Or   metoCLOPramide (REGLAN) injection 5-10 mg  5-10 mg Intravenous Q8H PRN Kathryne Hitch, MD       nortriptyline (PAMELOR) capsule 10 mg  10 mg Oral QHS Kathryne Hitch, MD   10 mg at 02/04/23 2120   ondansetron (ZOFRAN) tablet 4 mg  4 mg Oral Q6H PRN Kathryne Hitch,  MD       Or   ondansetron (ZOFRAN) injection 4 mg  4 mg Intravenous Q6H PRN Kathryne Hitch, MD       oxyCODONE (Oxy IR/ROXICODONE) immediate release tablet 5-10 mg  5-10 mg Oral Q4H PRN Kathryne Hitch, MD   10 mg at 02/04/23 1748   oxyCODONE-acetaminophen (PERCOCET/ROXICET) 5-325 MG per tablet 1 tablet  1 tablet Oral Q8H Kathryne Hitch, MD   1 tablet at 02/05/23 0545   polyvinyl alcohol (LIQUIFILM TEARS) 1.4 % ophthalmic solution 1 drop  1 drop Both Eyes QID PRN Kathryne Hitch, MD       rOPINIRole (REQUIP) tablet 2 mg  2 mg Oral Daily Kathryne Hitch, MD   2 mg at 02/04/23 1310   And   rOPINIRole (REQUIP) tablet 1 mg  1 mg Oral Daily Kathryne Hitch, MD   1 mg at 02/04/23 2119   sacubitril-valsartan (ENTRESTO) 24-26 mg per tablet  1 tablet Oral BID Kathryne Hitch, MD   1 tablet at 02/04/23 1610   senna-docusate (Senokot-S) tablet 1 tablet  1 tablet Oral QHS PRN Kathryne Hitch, MD       sodium chloride flush (NS) 0.9 % injection 3 mL  3 mL Intravenous Q12H Kathryne Hitch, MD   3 mL at 02/05/23 1005   sodium chloride flush (NS) 0.9 % injection 3 mL  3 mL Intravenous PRN Kathryne Hitch, MD        spironolactone (ALDACTONE) tablet 25 mg  25 mg Oral Daily Kathryne Hitch, MD   25 mg at 02/04/23 9604     Discharge Medications: Please see discharge summary for a list of discharge medications.  Relevant Imaging Results:  Relevant Lab Results:   Additional Information SS#: [id number]  Verna Czech [place], [place]

## 2023-02-05 NOTE — Progress Notes (Signed)
Subjective: 2 Days Post-Op Procedure(s) (LRB): LEFT INTRAMEDULLARY (IM) NAIL INTERTROCHANTERIC (Left) Patient reports pain as moderate.    Objective: Vital signs in last 24 hours: Temp:  [97.8 F (36.6 C)-98.1 F (36.7 C)] 98 F (36.7 C) (10/27 0734) Pulse Rate:  [42-93] 77 (10/27 0734) Resp:  [16-18] 16 (10/27 0421) BP: (72-119)/(30-48) 117/48 (10/27 1000) SpO2:  [91 %-100 %] 100 % (10/27 0734) Weight:  [97 kg] 97 kg (10/27 0421)  Intake/Output from previous day: 10/26 0701 - 10/27 0700 In: -  Out: 300 [Urine:300] Intake/Output this shift: No intake/output data recorded.  Recent Labs    02/02/23 2142 02/03/23 0102 02/04/23 0323 02/05/23 0754  HGB 13.7 13.9 11.4* 10.3*   Recent Labs    02/04/23 0323 02/05/23 0754  WBC 14.2* 11.6*  RBC 3.81* 3.43*  HCT 36.8 33.6*  PLT 238 200   Recent Labs    02/02/23 2142 02/03/23 0102  NA 139 138  K 4.4 3.9  CL 101 100  CO2 26 26  BUN 27* 25*  CREATININE 0.98 0.89  GLUCOSE 102* 98  CALCIUM 9.0 8.8*   Recent Labs    02/02/23 2142  INR 1.2    Neurologically intact Incision: moderate drainage No cellulitis present Compartment soft   Assessment/Plan: 2 Days Post-Op Procedure(s) (LRB): LEFT INTRAMEDULLARY (IM) NAIL INTERTROCHANTERIC (Left) Up with therapy WBAT LLE LLE ABLA- Has resumed Eliquis Nurse to change bandage D/c dispo per primary- likely SNF F/u with Dr. Dia Sitter, PA-C two weeks po       Cristie Hem 02/05/2023, 11:21 AM

## 2023-02-05 NOTE — Plan of Care (Signed)

## 2023-02-05 NOTE — TOC Initial Note (Signed)
Transition of Care [facility]) - Initial/Assessment Note    Patient Details  Name: Lisa Mcclure MRN: [id number] Date of Birth: [birth year]  Transition of Care [facility]) CM/SW Contact:    Verna Czech [place], [place] Phone Number: 02/05/2023, 11:38 AM  Clinical Narrative:                 CSW  met with patient at bedside to discuss therapy recommendation for SNF. [name] Dois Davenport present by phone. Patient agreeable to SNF, has no facility preference at this time. SNF process discussed, Fl2 faxed out for bed availability. Patient confirms living in a one-level town home. Her home  is accessible, with a wheelchair ramp, she also has a walker, in home aid 1x per week and housekeeper.  Patient currently on waiting list for independent living at [facility]. Bed offers to be provided to patient once received for choice.  TOC to continue to follow  Kimberlyn Quiocho, LCSW [place]  [facility], [facility] Licensed Clinical Social Worker Care Coordinator  Direct Dial: [phone]     Expected Discharge Plan: Skilled Nursing Facility Barriers to Discharge: Continued Medical Work up   Patient Goals and CMS Choice Patient states their goals for this hospitalization and ongoing recovery are:: "to bear weight, stand up and not become a wheelchair patient the rest of my life" CMS Medicare.gov Compare Post Acute Care list provided to:: Patient Choice offered to / list presented to : Patient, Adult Children      Expected Discharge Plan and Services In-house Referral: Clinical Social Work   Post Acute Care Choice: Skilled Nursing Facility Living arrangements for the past 2 months: Single Family Home (resides in a town home)                                      Prior Living Arrangements/Services Living arrangements for the past 2 months: Single Family Home (resides in a town home) Lives with:: Self Patient language and need for interpreter reviewed::  No Do you feel safe going back to the place where you live?: Yes      Need for Family Participation in Patient Care: Yes (Comment) Care giver support system in place?: Yes (comment) Current home services: DME, Homehealth aide (patient states that she has an aid that comes in 1x per week and housekeeperr 1x per week, currenlty on waiting list for Independent Living at [facility] reports home is accessible, also has a a walker) Criminal Activity/Legal Involvement Pertinent to Current Situation/Hospitalization: No - Comment as needed  Activities of Daily Living   ADL Screening (condition at time of admission) Independently performs ADLs?: No Does the patient have a NEW difficulty with bathing/dressing/toileting/self-feeding that is expected to last >3 days?: Yes (Initiates electronic notice to provider for possible OT consult) Does the patient have a NEW difficulty with getting in/out of bed, walking, or climbing stairs that is expected to last >3 days?: Yes (Initiates electronic notice to provider for possible PT consult) Does the patient have a NEW difficulty with communication that is expected to last >3 days?: No Is the patient deaf or have difficulty hearing?: No Does the patient have difficulty seeing, even when wearing glasses/contacts?: No Does the patient have difficulty concentrating, remembering, or making decisions?: No  Permission Sought/Granted Permission sought to share information with : Family Supports Permission granted to share information with : Yes, Verbal Permission Granted  Permission granted to share info w Relationship: sondra solomon  Permission granted to share info w Contact Information: [phone]  Emotional Assessment Appearance:: Appears stated age Attitude/Demeanor/Rapport: Engaged Affect (typically observed): Adaptable, Accepting Orientation: : Oriented to Self, Oriented to Place, Oriented to  Time, Oriented to Situation Alcohol / Substance Use:  Never Used Psych Involvement: No (comment)  Admission diagnosis:  Closed left hip fracture (HCC) [S72.002A] Closed fracture of left hip, initial encounter (HCC) [S72.002A] Patient Active Problem List   Diagnosis Date Noted   Closed comminuted intertrochanteric fracture of proximal end of left femur (HCC) 02/02/2023   S/P AAA repair 02/02/2023   Left atrial thrombus 02/02/2023   Closed left hip fracture (HCC) 02/02/2023   DNR (do not resuscitate) 01/25/2023   CHF exacerbation (HCC) 11/29/2022   Acute exacerbation of CHF (congestive heart failure) (HCC) 06/19/2022   Chronic diastolic CHF (congestive heart failure) (HCC) 04/25/2022   Chronic renal failure, stage 3b (HCC) 04/25/2022   Class 2 obesity 04/25/2022   Persistent atrial fibrillation (HCC) 04/22/2022   Chronic cough 03/28/2022   Acute hypoxemic respiratory failure (HCC) 06/15/2021   Paroxysmal atrial fibrillation (HCC) 06/15/2021   Acute renal failure with acute tubular necrosis superimposed on stage 3b chronic kidney disease (HCC) 06/15/2021   Generalized weakness 06/15/2021   Atrial fibrillation with RVR (HCC)    Long term (current) use of anticoagulants    Long term current use of antiarrhythmic drug    AAA (abdominal aortic aneurysm) without rupture (HCC) 12/20/2019   AAA (abdominal aortic aneurysm) (HCC) 12/20/2019   Intolerance of continuous positive airway pressure (CPAP) ventilation 11/26/2019   Severe hypoxemia 08/21/2019   Severe obstructive sleep apnea 07/29/2019   COPD (chronic obstructive pulmonary disease) (HCC) 07/29/2019   RLS (restless legs syndrome) 07/29/2019   Dehydration with hyponatremia 09/13/2018   Bradycardia 09/13/2018   Glaucoma 09/13/2018   OA (osteoarthritis) of shoulder 05/16/2018   Incomplete emptying of bladder 05/17/2017   Labial adhesions 05/17/2017   Mixed stress and urge urinary incontinence 05/17/2017   Urethral sphincter deficiency, intrinsic (ISD) 05/17/2017   Severe aortic  stenosis 01/15/2016   Bilateral carotid bruits 05/11/2015   Hyponatremia 04/19/2015   Symptomatic anemia 04/19/2015   Panic attack 04/19/2015   Constipation 04/19/2015   Hyperkalemia 04/17/2015   Influenza A 04/16/2015   Proximal humerus fracture    Left patella fracture    Benign essential HTN    Esophageal reflux    Chronic heart failure with preserved ejection fraction (HFpEF) (HCC)    Hypothyroidism    Lower GI bleed 04/08/2015   Hematochezia    Rectal bleeding 04/03/2015   Patellar fracture 03/06/2015   Left humeral fracture 11/04/2013   Rib fracture 11/04/2013   Hypothyroid 11/04/2013   Essential hypertension 11/04/2013   Right shoulder pain 11/04/2013   OSA (obstructive sleep apnea) 11/13/2008   BOOP (bronchiolitis obliterans with organizing pneumonia) (HCC) 08/25/2008   PCP:  Garlan Fillers, MD Pharmacy:   [facility] - [place], [place] - [address] [place] [address] Phone: [phone] Fax: [phone]  [facility] [address] [place] [address] Phone: [phone] Fax: [phone]  [facility] - [place], [place] - [address] [place] [address] Phone: [phone] Fax: [phone]     Social Determinants of Health (SDOH) Social History: SDOH Screenings   Food Insecurity: No Food Insecurity (02/03/2023)  Housing: Low Risk  (02/03/2023)  Transportation Needs: No Transportation Needs (02/03/2023)  Utilities: Not At Risk (02/03/2023)  Alcohol Screen: Low Risk  (08/11/2021)  Depression (PHQ2-9): Medium Risk (08/11/2021)  Financial Resource Strain: Low Risk  (08/11/2021)  Physical Activity: Inactive (08/11/2021)  Social Connections: Moderately Isolated (08/11/2021)  Stress: No Stress Concern Present (08/11/2021)  Tobacco Use: Medium Risk (02/03/2023)   SDOH Interventions:     Readmission Risk Interventions    12/02/2022    10:45 AM 06/21/2022    4:57 PM  Readmission Risk Prevention Plan  Transportation Screening Complete Complete  PCP or Specialist Appt within 3-5 Days Complete Complete  HRI or Home Care Consult Complete Complete  Social Work Consult for Recovery Care Planning/Counseling Complete   Palliative Care Screening Not Applicable Not Applicable  Medication Review Oceanographer) Referral to Pharmacy Complete

## 2023-02-05 NOTE — Progress Notes (Signed)
   02/04/23 2051  Assess: MEWS Score  Temp 98.1 F (36.7 C)  BP (!) 98/43  MAP (mmHg) (!) 57  Pulse Rate (!) 57 (manual)  Resp 18  Level of Consciousness Alert  SpO2 97 %  O2 Device Nasal Cannula  O2 Flow Rate (L/min) 3 L/min  Assess: if the MEWS score is Yellow or Red  Were vital signs accurate and taken at a resting state? Yes  Does the patient meet 2 or more of the SIRS criteria? No  MEWS guidelines implemented  Yes, yellow  Treat  MEWS Interventions Considered administering scheduled or prn medications/treatments as ordered  Take Vital Signs  Increase Vital Sign Frequency  Yellow: Q2hr x1, continue Q4hrs until patient remains green for 12hrs  Escalate  MEWS: Escalate Yellow: Discuss with charge nurse and consider notifying provider and/or RRT  Notify: Charge Nurse/RN  Name of Charge Nurse/RN Notified Ida, RN  Provider Notification  Provider Name/Title Luiz Iron  Date Provider Notified 02/04/23  Time Provider Notified 2109  Method of Notification Page  Notification Reason Change in status  Provider response See new orders  Date of Provider Response 02/04/23  Time of Provider Response 2120  Assess: SIRS CRITERIA  SIRS Temperature  0  SIRS Pulse 0  SIRS Respirations  0  SIRS WBC 0  SIRS Score Sum  0

## 2023-02-06 ENCOUNTER — Inpatient Hospital Stay (HOSPITAL_COMMUNITY): Payer: Medicare Other

## 2023-02-06 ENCOUNTER — Encounter (HOSPITAL_COMMUNITY): Payer: Self-pay | Admitting: Orthopaedic Surgery

## 2023-02-06 DIAGNOSIS — E039 Hypothyroidism, unspecified: Secondary | ICD-10-CM | POA: Diagnosis not present

## 2023-02-06 DIAGNOSIS — I350 Nonrheumatic aortic (valve) stenosis: Secondary | ICD-10-CM | POA: Diagnosis not present

## 2023-02-06 DIAGNOSIS — I10 Essential (primary) hypertension: Secondary | ICD-10-CM | POA: Diagnosis not present

## 2023-02-06 DIAGNOSIS — S72142A Displaced intertrochanteric fracture of left femur, initial encounter for closed fracture: Secondary | ICD-10-CM | POA: Diagnosis not present

## 2023-02-06 LAB — CBC
HCT: 34.4 % — ABNORMAL LOW (ref 36.0–46.0)
Hemoglobin: 10.4 g/dL — ABNORMAL LOW (ref 12.0–15.0)
MCH: 29.9 pg (ref 26.0–34.0)
MCHC: 30.2 g/dL (ref 30.0–36.0)
MCV: 98.9 fL (ref 80.0–100.0)
Platelets: 221 10*3/uL (ref 150–400)
RBC: 3.48 MIL/uL — ABNORMAL LOW (ref 3.87–5.11)
RDW: 14.1 % (ref 11.5–15.5)
WBC: 9.8 10*3/uL (ref 4.0–10.5)
nRBC: 0 % (ref 0.0–0.2)

## 2023-02-06 MED ORDER — ALUM & MAG HYDROXIDE-SIMETH 200-200-20 MG/5ML PO SUSP
30.0000 mL | ORAL | Status: DC | PRN
  Administered 2023-02-06: 30 mL via ORAL
  Filled 2023-02-06: qty 30

## 2023-02-06 MED ORDER — POLYETHYLENE GLYCOL 3350 17 G PO PACK
17.0000 g | PACK | Freq: Every day | ORAL | Status: DC
  Administered 2023-02-06 – 2023-02-07 (×2): 17 g via ORAL
  Filled 2023-02-06 (×2): qty 1

## 2023-02-06 MED ORDER — DOXYCYCLINE HYCLATE 100 MG PO TABS
100.0000 mg | ORAL_TABLET | Freq: Two times a day (BID) | ORAL | Status: DC
  Administered 2023-02-06 – 2023-02-07 (×2): 100 mg via ORAL
  Filled 2023-02-06 (×2): qty 1

## 2023-02-06 MED ORDER — ENTRESTO 24-26 MG PO TABS: 1.0000 | ORAL_TABLET | Freq: Two times a day (BID) | ORAL | 6 refills | Status: DC

## 2023-02-06 MED ORDER — DAPAGLIFLOZIN PROPANEDIOL 10 MG PO TABS: 10.0000 mg | ORAL_TABLET | Freq: Every day | ORAL | 6 refills | Status: DC

## 2023-02-06 NOTE — Plan of Care (Signed)
  Problem: Clinical Measurements: Goal: Respiratory complications will improve Outcome: Progressing   Problem: Clinical Measurements: Goal: Cardiovascular complication will be avoided Outcome: Progressing   Problem: Activity: Goal: Risk for activity intolerance will decrease Outcome: Progressing   Problem: Pain Management: Goal: General experience of comfort will improve Outcome: Progressing

## 2023-02-06 NOTE — Progress Notes (Signed)
Occupational Therapy Treatment Patient Details Name: Lisa Mcclure MRN: [id number] DOB: 07/09/1932 Today's Date: 02/06/2023   History of present illness The pt is an 89 yo female presenting 10/24 after a fall at home, pt with L hip fx s/p ORIF 10/25.  PMH includes: chronic diastolic CHF, permanent atrial fibrillation on Eliquis, COPD, OSA on 2 L nasal cannula at night only, HTN, OSA, and aortic stenosis.   OT comments  Pt in good spirits, eager to participate and improve. Pt daughter present during session, co-treat with PT to maximize Pt safety and function. Pt c/o pain in B shoulders and L hip with movement, B shoulder stiffness limits ability to support weight with RW or push to stand with transfers, limits ability to complete UB ADLs.  Pt able to complete bed mobility max A x2 to assist to EOB. Mod-max A for STS, able to stand up to 8 minutes with RW. Pt has LOB when attempting to pivot, requires significant increased time, x2 support, max effort. Pt would benefit from use of Stedy to maximize safety with transfers at this time.  Pt instructed on use of sock aide, good ability to complete with set up. Pt and daughter instructed on BUE HEP to improve grip, elbow, and shoulder strength/ROM. Pt DC plan to postacute rehab still appropriate, will continue to see acutely as able.       If plan is discharge home, recommend the following:  A lot of help with walking and/or transfers;A lot of help with bathing/dressing/bathroom   Equipment Recommendations  Other (comment)    Recommendations for Other Services      Precautions / Restrictions Precautions Precautions: Fall Precaution Comments: watch SpO2 87-93% on RA Restrictions Weight Bearing Restrictions: Yes LLE Weight Bearing: Weight bearing as tolerated       Mobility Bed Mobility Overal bed mobility: Needs Assistance Bed Mobility: Supine to Sit     Supine to sit: Max assist, +2 for physical assistance, HOB elevated, Used  rails     General bed mobility comments: max A x2 for sitting up in bed and sliding BLEs to edge.    Transfers Overall transfer level: Needs assistance Equipment used: Rolling walker (2 wheels) Transfers: Sit to/from Stand, Bed to chair/wheelchair/BSC Sit to Stand: Max assist, +2 physical assistance Stand pivot transfers: Max assist         General transfer comment: max A x2 for stand pivot transfer, mod A to rise from elevated surface. Will benefit from use of stedy for safety     Balance Overall balance assessment: Needs assistance Sitting-balance support: No upper extremity supported, Feet supported Sitting balance-Leahy Scale: Fair Sitting balance - Comments: EOB ADLs   Standing balance support: Bilateral upper extremity supported, During functional activity, Reliant on assistive device for balance Standing balance-Leahy Scale: Poor Standing balance comment: reliant on RW for support, LOB with pivot tranfer, able to stand up to 7-8 minutes with x2 support                           ADL either performed or assessed with clinical judgement   ADL Overall ADL's : Needs assistance/impaired Eating/Feeding: Set up;Sitting   Grooming: Set up;Sitting               Lower Body Dressing: Sitting/lateral leans;With adaptive equipment;Maximal assistance   Toilet Transfer: Moderate assistance;Rolling walker (2 wheels);+2 for physical assistance;+2 for safety/equipment   Toileting- Clothing Manipulation and Hygiene: Moderate assistance;Sit to/from stand;+2 for  physical assistance;+2 for safety/equipment       Functional mobility during ADLs: Moderate assistance;Rolling walker (2 wheels);+2 for physical assistance;+2 for safety/equipment General ADL Comments: mod-max A x2 for STS from elevated surface, max effort/increased time for pivot transfer. Pt instructed on use of sock aide, able to use with set up, max A for donning pants    Extremity/Trunk Assessment Upper  Extremity Assessment Upper Extremity Assessment: RUE deficits/detail;LUE deficits/detail RUE Deficits / Details: bilateral frozen shoulders limited ROM due to pain otherwise WFL with generalized weakness RUE: Shoulder pain with ROM RUE Sensation: WNL RUE Coordination: decreased gross motor LUE Deficits / Details: bilateral frozen shoulders limited ROM due to pain otherwise WFL with generalized weakness LUE: Shoulder pain with ROM LUE Sensation: WNL LUE Coordination: decreased gross motor   Lower Extremity Assessment Lower Extremity Assessment: Defer to PT evaluation        Vision       Perception     Praxis      Cognition Arousal: Alert Behavior During Therapy: WFL for tasks assessed/performed Overall Cognitive Status: Within Functional Limits for tasks assessed                                          Exercises      Shoulder Instructions       General Comments      Pertinent Vitals/ Pain       Pain Assessment Pain Assessment: Faces Faces Pain Scale: Hurts even more Pain Location: L hip Pain Descriptors / Indicators: Sore Pain Intervention(s): Monitored during session  Home Living                                          Prior Functioning/Environment              Frequency  Min 2X/week        Progress Toward Goals  OT Goals(current goals can now be found in the care plan section)  Progress towards OT goals: Progressing toward goals  Acute Rehab OT Goals Patient Stated Goal: to return to PLOF OT Goal Formulation: With patient/family Time For Goal Achievement: 02/18/23 Potential to Achieve Goals: Good ADL Goals Pt Will Perform Lower Body Dressing: sit to/from stand;with adaptive equipment;with min assist Pt Will Transfer to Toilet: ambulating;with min assist Additional ADL Goal #1: Pt will demonstrate independence with hip precautions. Additional ADL Goal #2: Pt will complete bed mobility with moderate  assistance in preparation for ADL.  Plan      Co-evaluation    PT/OT/SLP Co-Evaluation/Treatment: Yes Reason for Co-Treatment: Complexity of the patient's impairments (multi-system involvement);For patient/therapist safety;To address functional/ADL transfers   OT goals addressed during session: ADL's and self-care;Proper use of Adaptive equipment and DME;Strengthening/ROM      AM-PAC OT "6 Clicks" Daily Activity     Outcome Measure   Help from another person eating meals?: A Little Help from another person taking care of personal grooming?: A Little Help from another person toileting, which includes using toliet, bedpan, or urinal?: A Lot Help from another person bathing (including washing, rinsing, drying)?: A Lot Help from another person to put on and taking off regular upper body clothing?: A Little Help from another person to put on and taking off regular lower body clothing?: A Lot 6 Click  Score: 15    End of Session Equipment Utilized During Treatment: Gait belt;Rolling walker (2 wheels)  OT Visit Diagnosis: Other abnormalities of gait and mobility (R26.89);Muscle weakness (generalized) (M62.81);History of falling (Z91.81);Pain Pain - Right/Left: Left Pain - part of body: Hip   Activity Tolerance Patient tolerated treatment well   Patient Left in chair;with call bell/phone within reach;with chair alarm set;with family/visitor present   Nurse Communication Mobility status;Need for lift equipment        Time: 1610-9604 OT Time Calculation (min): 54 min  Charges: OT General Charges $OT Visit: 1 Visit OT Treatments $Self Care/Home Management : 8-22 mins $Therapeutic Activity: 8-22 mins  Noelle Sease, OTR/L   Alexis Goodell 02/06/2023, 1:26 PM

## 2023-02-06 NOTE — Progress Notes (Signed)
Physical Therapy Treatment Patient Details Name: Lisa Mcclure MRN: [id number] DOB: Oct 27, 1932 Today's Date: 02/06/2023   History of Present Illness The pt is an 89 yo female presenting 10/24 after a fall at home, pt with L hip fx s/p ORIF 10/25.  PMH includes: chronic diastolic CHF, permanent atrial fibrillation on Eliquis, COPD, OSA on 2 L nasal cannula at night only, HTN, OSA, and aortic stenosis.    PT Comments  The pt was seen for continued mobility progression and OOB mobility. She was able to complete small ROM movement in L knee and hip form bed, very limited by pain in L hip at this time. She continues to need maxA of 2 to complete sit-stand transfers, and attempts at lateral/pivotal steps to reach recliner. The pt is limited by poor shoulder ROM and strength (bilateral chronic frozen shoulder) in addition to acute L hip pain. Continue to recommend post-acute therapies to progress back towards independence. Pt's daughter present and supportive.    If plan is discharge home, recommend the following: Two people to help with walking and/or transfers;Two people to help with bathing/dressing/bathroom;Assistance with cooking/housework;Assistance with feeding;Direct supervision/assist for medications management;Direct supervision/assist for financial management;Assist for transportation;Help with stairs or ramp for entrance   Can travel by private vehicle     No  Equipment Recommendations  Wheelchair (measurements PT);BSC/3in1;Wheelchair cushion (measurements PT)    Recommendations for Other Services       Precautions / Restrictions Precautions Precautions: Fall Precaution Comments: watch SpO2 87-93% on RA Restrictions Weight Bearing Restrictions: Yes LLE Weight Bearing: Weight bearing as tolerated     Mobility  Bed Mobility Overal bed mobility: Needs Assistance Bed Mobility: Supine to Sit     Supine to sit: Max assist, +2 for physical assistance, HOB elevated, Used  rails     General bed mobility comments: max A x2 for sitting up in bed and sliding BLEs to edge.    Transfers Overall transfer level: Needs assistance Equipment used: Rolling walker (2 wheels) Transfers: Sit to/from Stand, Bed to chair/wheelchair/BSC Sit to Stand: Max assist, +2 physical assistance Stand pivot transfers: Max assist         General transfer comment: max A x2 for stand pivot transfer, mod A to rise from elevated surface. Will benefit from use of stedy for safety     Balance Overall balance assessment: Needs assistance Sitting-balance support: No upper extremity supported, Feet supported Sitting balance-Leahy Scale: Fair Sitting balance - Comments: EOB ADLs   Standing balance support: Bilateral upper extremity supported, During functional activity, Reliant on assistive device for balance Standing balance-Leahy Scale: Poor Standing balance comment: reliant on RW for support, LOB with pivot tranfer, able to stand up to 7-8 minutes with x2 support                            Cognition Arousal: Alert Behavior During Therapy: WFL for tasks assessed/performed Overall Cognitive Status: Within Functional Limits for tasks assessed                                          Exercises General Exercises - Upper Extremity Shoulder Flexion: Strengthening, Both, 5 reps, Theraband Theraband Level (Shoulder Flexion): Level 2 (Red) Elbow Flexion: Strengthening, Both, 5 reps, Theraband Theraband Level (Elbow Flexion): Level 2 (Red) Elbow Extension: Strengthening, Both, 5 reps, Theraband Theraband Level (Elbow Extension):  Level 2 (Red) General Exercises - Lower Extremity Ankle Circles/Pumps: AROM, Both, 10 reps, Seated Quad Sets: AROM, Left, 5 reps, Seated Heel Slides: AAROM, Left, 10 reps, Supine Hip ABduction/ADduction: AAROM, Left, 10 reps, Supine    General Comments General comments (skin integrity, edema, etc.): daughter present and  supportive, discussed exercises she can complete      Pertinent Vitals/Pain Pain Assessment Pain Assessment: Faces Faces Pain Scale: Hurts even more Pain Location: L hip Pain Descriptors / Indicators: Sore Pain Intervention(s): Monitored during session, Limited activity within patient's tolerance, Repositioned, Ice applied     PT Goals (current goals can now be found in the care plan section) Acute Rehab PT Goals PT Goal Formulation: With patient Time For Goal Achievement: 02/18/23 Potential to Achieve Goals: Good Progress towards PT goals: Progressing toward goals    Frequency    Min 1X/week      PT Plan      Co-evaluation PT/OT/SLP Co-Evaluation/Treatment: Yes Reason for Co-Treatment: Complexity of the patient's impairments (multi-system involvement);For patient/therapist safety;To address functional/ADL transfers PT goals addressed during session: Mobility/safety with mobility;Balance;Proper use of DME;Strengthening/ROM OT goals addressed during session: ADL's and self-care;Proper use of Adaptive equipment and DME;Strengthening/ROM      AM-PAC PT "6 Clicks" Mobility   Outcome Measure  Help needed turning from your back to your side while in a flat bed without using bedrails?: A Lot Help needed moving from lying on your back to sitting on the side of a flat bed without using bedrails?: A Lot Help needed moving to and from a bed to a chair (including a wheelchair)?: A Lot Help needed standing up from a chair using your arms (e.g., wheelchair or bedside chair)?: A Lot Help needed to walk in hospital room?: Total Help needed climbing 3-5 steps with a railing? : Total 6 Click Score: 10    End of Session Equipment Utilized During Treatment: Gait belt Activity Tolerance: Patient limited by pain Patient left: in chair;with call bell/phone within reach;with chair alarm set;with family/visitor present Nurse Communication: Mobility status (use stedy) PT Visit Diagnosis:  Unsteadiness on feet (R26.81);Other abnormalities of gait and mobility (R26.89);Muscle weakness (generalized) (M62.81);Pain Pain - Right/Left: Left Pain - part of body: Hip     Time: 6063-0160 PT Time Calculation (min) (ACUTE ONLY): 53 min  Charges:    $Therapeutic Exercise: 8-22 mins $Therapeutic Activity: 8-22 mins PT General Charges $$ ACUTE PT VISIT: 1 Visit                     Vickki Muff, PT, DPT   Acute Rehabilitation Department Office [phone] Secure Chat Communication Preferred   Ronnie Derby 02/06/2023, 3:00 PM

## 2023-02-06 NOTE — Care Management Important Message (Signed)
Important Message  Patient Details  Name: Lisa Mcclure MRN: [id number] Date of Birth: 1932/06/14   Important Message Given:  Yes - Medicare IM     Sherilyn Banker 02/06/2023, 4:19 PM

## 2023-02-06 NOTE — Progress Notes (Signed)
PROGRESS NOTE    Lisa Mcclure  TDD:[id number] DOB: 1933-03-17 DOA: 02/02/2023 PCP: Garlan Fillers, MD   Brief Narrative: Lisa Mcclure is a 89 y.o. female with a history of heart failure, hypertension, paroxysmal atrial fibrillation, COPD, obstructive sleep apnea, severe aortic stenosis, history of AAA repair history of pseudoaneurysm sac repair of AAA, left atrial thrombus on Eliquis, hypothyroidism, generalized anxiety disorder.  Patient presented secondary to fall with resultant left hip pain and was found to have a left hip fracture.  Orthopedic surgery was consulted and performed ORIF.   Assessment and Plan:  Displaced intertrochanteric fracture of the left femur Secondary to trip and fall.  No associated loss of consciousness.  Initial x-ray significant for evidence of a comminuted displaced intertrochanteric left proximal femur fracture.  Orthopedic surgery was consulted with plan for surgical repair.  Case discussed with Dr. Jacinto Halim who recommends to proceed with surgery secondary to high morbidity of unrepaired fracture. Orthopedic surgery performed ORIF on 10/25. PT/OT recommending SNF.  Postoperative anemia Expected. Mild. Some blood noted under upper dressing. Pre-operative hemoglobin of 13.9 g/dL. Hemoglobin stable at 10.4 g/dL today  Chronic diastolic heart failure Stable. Patient is on furosemide, Entresto and spironolactone as an outpatient. -Continue Entresto, spironolactone and furosemide  Productive cough Possible aspiration related to recent surgery. Chest x-ray obtained and is without etiology for symptoms. No fever or leukocytosis. Possible bronchitis. -Doxycycline x5 days  Hypotension In setting of antihypertensives, recent anesthesia, scheduled narcotics and known aortic stenosis. Resolved.  Severe aortic stenosis Patient follows with cardiology with outpatient consideration for aortic valve replacement. Discussed with cardiology and there is no  barrier to surgery regarding aortic stenosis when weighed against morbidity of no surgical fixation.  Paroxysmal atrial fibrillation Currently in sinus rhythm with ventricular trigeminy on EKG. Patient is managed on Eliquis for stroke risk reduction. She is not on rate or rhythm control. Eliquis transitioned to Heparin IV. Eliquis resumed. -Continue Eliquis  Left ventricular thrombus Patient is managed on Eliquis as an outpatient which is held secondary to need for surgery. Patient transitioned to Heparin IV -Continue Eliquis  Hypothyroidism -Continue Synthroid  COPD Stable and without exacerbation.  OSA Not on CPAP. Patient uses nocturnal oxygen. -Overnight pulse ox  DVT prophylaxis: Eliquis Code Status:   Code Status: Full Code Family Communication: Daughter at bedside Disposition Plan: Discharge likely to SNF in 1 day   Consultants:  Orthopedic surgery  Procedures:  10/25: ORIF  Antimicrobials: None    Subjective: Ongoing productive cough. Pain with mobility.  Objective: BP 132/65 (BP Location: Left Arm)   Pulse 63   Temp 98.1 F (36.7 C) (Oral)   Resp 18   Ht 5\' 3"  (1.6 m)   Wt 97 kg   SpO2 95%   BMI 37.88 kg/m   Examination:  General exam: Appears calm and comfortable Respiratory system: Clear to auscultation. Respiratory effort normal. Cardiovascular system: S1 & S2 heard, RRR. 2/6 systolic murmur. Gastrointestinal system: Abdomen is nondistended, soft and nontender. Normal bowel sounds heard. Central nervous system: Alert and oriented.  Psychiatry: Judgement and insight appear normal. Mood & affect appropriate.    Data Reviewed: I have personally reviewed following labs and imaging studies  CBC Lab Results  Component Value Date   WBC 9.8 02/06/2023   RBC 3.48 (L) 02/06/2023   HGB 10.4 (L) 02/06/2023   HCT 34.4 (L) 02/06/2023   MCV 98.9 02/06/2023   MCH 29.9 02/06/2023   PLT 221 02/06/2023   MCHC 30.2  02/06/2023   RDW 14.1 02/06/2023    LYMPHSABS 0.9 02/02/2023   MONOABS 1.0 02/02/2023   EOSABS 0.1 02/02/2023   BASOSABS 0.0 02/02/2023     Last metabolic panel Lab Results  Component Value Date   NA 138 02/03/2023   K 3.9 02/03/2023   CL 100 02/03/2023   CO2 26 02/03/2023   BUN 25 (H) 02/03/2023   CREATININE 0.89 02/03/2023   GLUCOSE 98 02/03/2023   GFRNONAA >60 02/03/2023   GFRAA 41 (L) 04/30/2020   CALCIUM 8.8 (L) 02/03/2023   PHOS 5.1 (H) 06/20/2022   PROT 7.4 02/03/2023   ALBUMIN 3.6 02/03/2023   BILITOT 0.9 02/03/2023   ALKPHOS 85 02/03/2023   AST 19 02/03/2023   ALT 12 02/03/2023   ANIONGAP 12 02/03/2023    GFR: Estimated Creatinine Clearance: 49.4 mL/min (by C-G formula based on SCr of 0.89 mg/dL).  Recent Results (from the past 240 hour(s))  Surgical pcr screen     Status: None   Collection Time: 02/03/23  2:16 PM   Specimen: Nasal Mucosa; Nasal Swab  Result Value Ref Range Status   MRSA, PCR NEGATIVE NEGATIVE Final   Staphylococcus aureus NEGATIVE NEGATIVE Final    Comment: (NOTE) The Xpert SA Assay (FDA approved for NASAL specimens in patients 46 years of age and older), is one component of a comprehensive surveillance program. It is not intended to diagnose infection nor to guide or monitor treatment. Performed at [facility], [address] [place], [place] [address]       Radiology Studies: DG CHEST PORT 1 VIEW  Result Date: 02/06/2023 CLINICAL DATA:  Productive cough. EXAM: PORTABLE CHEST 1 VIEW COMPARISON:  February 02, 2023. FINDINGS: Stable cardiomegaly. Stable bibasilar interstitial densities are noted most consistent with scarring. No definite acute abnormality is noted. Severe degenerative changes are seen involving both glenohumeral joints. IMPRESSION: Chronic findings as noted above.  No acute abnormality seen. Electronically Signed   By: Lupita Raider M.D.   On: 02/06/2023 15:41      LOS: 4 days    Jacquelin Hawking, MD [facility] 02/06/2023, 3:51  PM   If 7PM-7AM, please contact night-coverage [email or web address]

## 2023-02-06 NOTE — TOC Progression Note (Addendum)
Transition of Care [facility]) - Progression Note    Patient Details  Name: Lisa Mcclure MRN: [id number] Date of Birth: Jun 18, 1932  Transition of [facility]) CM/SW Contact  Lorri Frederick, LCSW Phone Number: 02/06/2023, 9:58 AM  Clinical Narrative:   Bed offers on medicare choice document provided to pt and daughter Isaiah Serge.  They will review.   1100: CSW spoke with pt and daughter and they want to accept offer at [facility].  CSW confirmed with [facility] that they can receive pt today.   MD informed.   Expected Discharge Plan: Skilled Nursing Facility Barriers to Discharge: Continued Medical Work up  Expected Discharge Plan and Services In-house Referral: Clinical Social Work   Post Acute Care Choice: Skilled Nursing Facility Living arrangements for the past 2 months: Single Family Home (resides in a town home)                                       Social Determinants of Health (SDOH) Interventions SDOH Screenings   Food Insecurity: No Food Insecurity (02/03/2023)  Housing: Low Risk  (02/03/2023)  Transportation Needs: No Transportation Needs (02/03/2023)  Utilities: Not At Risk (02/03/2023)  Alcohol Screen: Low Risk  (08/11/2021)  Depression (PHQ2-9): Medium Risk (08/11/2021)  Financial Resource Strain: Low Risk  (08/11/2021)  Physical Activity: Inactive (08/11/2021)  Social Connections: Moderately Isolated (08/11/2021)  Stress: No Stress Concern Present (08/11/2021)  Tobacco Use: Medium Risk (02/03/2023)    Readmission Risk Interventions    12/02/2022   10:45 AM 06/21/2022    4:57 PM  Readmission Risk Prevention Plan  Transportation Screening Complete Complete  PCP or Specialist Appt within 3-5 Days Complete Complete  HRI or Home Care Consult Complete Complete  Social Work Consult for Recovery Care Planning/Counseling Complete   Palliative Care Screening Not Applicable Not Applicable  Medication Review [facility]) Referral to Pharmacy Complete

## 2023-02-06 NOTE — Addendum Note (Signed)
Addended by: Sharin Grave on: 02/06/2023 01:31 PM   Modules accepted: Orders

## 2023-02-07 DIAGNOSIS — K219 Gastro-esophageal reflux disease without esophagitis: Secondary | ICD-10-CM | POA: Diagnosis not present

## 2023-02-07 DIAGNOSIS — M19041 Primary osteoarthritis, right hand: Secondary | ICD-10-CM | POA: Diagnosis not present

## 2023-02-07 DIAGNOSIS — S72002A Fracture of unspecified part of neck of left femur, initial encounter for closed fracture: Secondary | ICD-10-CM | POA: Diagnosis not present

## 2023-02-07 DIAGNOSIS — F331 Major depressive disorder, recurrent, moderate: Secondary | ICD-10-CM | POA: Diagnosis not present

## 2023-02-07 DIAGNOSIS — G8911 Acute pain due to trauma: Secondary | ICD-10-CM | POA: Diagnosis not present

## 2023-02-07 DIAGNOSIS — M069 Rheumatoid arthritis, unspecified: Secondary | ICD-10-CM | POA: Diagnosis not present

## 2023-02-07 DIAGNOSIS — I480 Paroxysmal atrial fibrillation: Secondary | ICD-10-CM | POA: Diagnosis not present

## 2023-02-07 DIAGNOSIS — I509 Heart failure, unspecified: Secondary | ICD-10-CM | POA: Diagnosis not present

## 2023-02-07 DIAGNOSIS — I350 Nonrheumatic aortic (valve) stenosis: Secondary | ICD-10-CM | POA: Diagnosis not present

## 2023-02-07 DIAGNOSIS — I3139 Other pericardial effusion (noninflammatory): Secondary | ICD-10-CM | POA: Diagnosis not present

## 2023-02-07 DIAGNOSIS — M25552 Pain in left hip: Secondary | ICD-10-CM | POA: Diagnosis not present

## 2023-02-07 DIAGNOSIS — I130 Hypertensive heart and chronic kidney disease with heart failure and stage 1 through stage 4 chronic kidney disease, or unspecified chronic kidney disease: Secondary | ICD-10-CM | POA: Diagnosis not present

## 2023-02-07 DIAGNOSIS — R278 Other lack of coordination: Secondary | ICD-10-CM | POA: Diagnosis not present

## 2023-02-07 DIAGNOSIS — R531 Weakness: Secondary | ICD-10-CM | POA: Diagnosis not present

## 2023-02-07 DIAGNOSIS — Z8781 Personal history of (healed) traumatic fracture: Secondary | ICD-10-CM | POA: Diagnosis not present

## 2023-02-07 DIAGNOSIS — W010XXA Fall on same level from slipping, tripping and stumbling without subsequent striking against object, initial encounter: Secondary | ICD-10-CM | POA: Diagnosis not present

## 2023-02-07 DIAGNOSIS — Y92009 Unspecified place in unspecified non-institutional (private) residence as the place of occurrence of the external cause: Secondary | ICD-10-CM | POA: Diagnosis not present

## 2023-02-07 DIAGNOSIS — I513 Intracardiac thrombosis, not elsewhere classified: Secondary | ICD-10-CM | POA: Diagnosis not present

## 2023-02-07 DIAGNOSIS — M25462 Effusion, left knee: Secondary | ICD-10-CM | POA: Diagnosis not present

## 2023-02-07 DIAGNOSIS — G4700 Insomnia, unspecified: Secondary | ICD-10-CM | POA: Diagnosis not present

## 2023-02-07 DIAGNOSIS — S72352D Displaced comminuted fracture of shaft of left femur, subsequent encounter for closed fracture with routine healing: Secondary | ICD-10-CM | POA: Diagnosis not present

## 2023-02-07 DIAGNOSIS — F411 Generalized anxiety disorder: Secondary | ICD-10-CM | POA: Diagnosis not present

## 2023-02-07 DIAGNOSIS — M25562 Pain in left knee: Secondary | ICD-10-CM | POA: Diagnosis not present

## 2023-02-07 DIAGNOSIS — J449 Chronic obstructive pulmonary disease, unspecified: Secondary | ICD-10-CM | POA: Diagnosis not present

## 2023-02-07 DIAGNOSIS — I4819 Other persistent atrial fibrillation: Secondary | ICD-10-CM | POA: Diagnosis not present

## 2023-02-07 DIAGNOSIS — G2581 Restless legs syndrome: Secondary | ICD-10-CM | POA: Diagnosis not present

## 2023-02-07 DIAGNOSIS — N1832 Chronic kidney disease, stage 3b: Secondary | ICD-10-CM | POA: Diagnosis not present

## 2023-02-07 DIAGNOSIS — R41841 Cognitive communication deficit: Secondary | ICD-10-CM | POA: Diagnosis not present

## 2023-02-07 DIAGNOSIS — E039 Hypothyroidism, unspecified: Secondary | ICD-10-CM | POA: Diagnosis not present

## 2023-02-07 DIAGNOSIS — Z96652 Presence of left artificial knee joint: Secondary | ICD-10-CM | POA: Diagnosis not present

## 2023-02-07 DIAGNOSIS — M79652 Pain in left thigh: Secondary | ICD-10-CM | POA: Diagnosis not present

## 2023-02-07 DIAGNOSIS — G4733 Obstructive sleep apnea (adult) (pediatric): Secondary | ICD-10-CM | POA: Diagnosis not present

## 2023-02-07 DIAGNOSIS — K5900 Constipation, unspecified: Secondary | ICD-10-CM | POA: Diagnosis not present

## 2023-02-07 DIAGNOSIS — Z23 Encounter for immunization: Secondary | ICD-10-CM | POA: Diagnosis not present

## 2023-02-07 DIAGNOSIS — R2689 Other abnormalities of gait and mobility: Secondary | ICD-10-CM | POA: Diagnosis not present

## 2023-02-07 DIAGNOSIS — I5043 Acute on chronic combined systolic (congestive) and diastolic (congestive) heart failure: Secondary | ICD-10-CM | POA: Diagnosis not present

## 2023-02-07 DIAGNOSIS — E875 Hyperkalemia: Secondary | ICD-10-CM | POA: Diagnosis not present

## 2023-02-07 DIAGNOSIS — I872 Venous insufficiency (chronic) (peripheral): Secondary | ICD-10-CM | POA: Diagnosis not present

## 2023-02-07 DIAGNOSIS — I714 Abdominal aortic aneurysm, without rupture, unspecified: Secondary | ICD-10-CM | POA: Diagnosis not present

## 2023-02-07 DIAGNOSIS — Z9889 Other specified postprocedural states: Secondary | ICD-10-CM | POA: Diagnosis not present

## 2023-02-07 DIAGNOSIS — M6281 Muscle weakness (generalized): Secondary | ICD-10-CM | POA: Diagnosis not present

## 2023-02-07 DIAGNOSIS — Z7401 Bed confinement status: Secondary | ICD-10-CM | POA: Diagnosis not present

## 2023-02-07 DIAGNOSIS — M1712 Unilateral primary osteoarthritis, left knee: Secondary | ICD-10-CM | POA: Diagnosis not present

## 2023-02-07 DIAGNOSIS — H409 Unspecified glaucoma: Secondary | ICD-10-CM | POA: Diagnosis not present

## 2023-02-07 DIAGNOSIS — D631 Anemia in chronic kidney disease: Secondary | ICD-10-CM | POA: Diagnosis not present

## 2023-02-07 MED ORDER — DOXYCYCLINE HYCLATE 100 MG PO TABS: 100.0000 mg | ORAL_TABLET | Freq: Two times a day (BID) | ORAL | Status: AC

## 2023-02-07 MED ORDER — BISACODYL 10 MG RE SUPP
10.0000 mg | Freq: Once | RECTAL | Status: AC
  Administered 2023-02-07: 10 mg via RECTAL
  Filled 2023-02-07: qty 1

## 2023-02-07 MED ORDER — OXYCODONE HCL 5 MG PO TABS: 5.0000 mg | ORAL_TABLET | ORAL | 0 refills | Status: DC | PRN

## 2023-02-07 MED ORDER — POLYETHYLENE GLYCOL 3350 17 G PO PACK: 17.0000 g | PACK | Freq: Two times a day (BID) | ORAL | Status: AC

## 2023-02-07 MED ORDER — GUAIFENESIN ER 600 MG PO TB12: 600.0000 mg | ORAL_TABLET | Freq: Two times a day (BID) | ORAL | Status: AC

## 2023-02-07 NOTE — Discharge Summary (Signed)
of fluoroscopy. Electronically Signed   By: Thornell Sartorius M.D.   On: 02/03/2023 21:21   DG C-Arm 1-60 Min-No Report  Result Date: 02/03/2023 Fluoroscopy was utilized by the requesting physician.  No radiographic interpretation.   DG C-Arm 1-60 Min-No Report  Result Date: 02/03/2023 Fluoroscopy was utilized by the requesting physician.  No radiographic interpretation.   DG Chest Port 1 View  Result Date: 02/02/2023 CLINICAL DATA:  Fall with left hip fracture. EXAM: PORTABLE CHEST 1 VIEW COMPARISON:  11/29/2022 FINDINGS: Chronic cardiomegaly. Unchanged mediastinal contours. Aortic atherosclerosis. Chronic interstitial coarsening without focal airspace disease. No pleural effusion or pneumothorax.  Advanced chronic shoulder arthropathy. IMPRESSION: Chronic cardiomegaly and interstitial coarsening. No acute chest findings. Electronically Signed   By: Narda Rutherford M.D.   On: 02/02/2023 21:44   DG Hip Unilat With Pelvis 2-3 Views Left  Result Date: 02/02/2023 CLINICAL DATA:  Fall with left leg pain. EXAM: DG HIP (WITH OR WITHOUT PELVIS) 2-3V LEFT COMPARISON:  None Available. FINDINGS: Comminuted displaced intertrochanteric left proximal femur fracture mild proximal migration of the femoral shaft. The femoral head is well seated. No hip dislocation. Bilateral narrowing hip osteoarthritis. Bony pelvis is intact. No rami fractures. No pubic symphyseal or sacroiliac diastasis. Aorto bi-iliac stent graft partially included in the field of view. Suspect prior bladder tack. IMPRESSION: Comminuted displaced intertrochanteric left proximal femur fracture. Electronically Signed   By: Narda Rutherford M.D.   On: 02/02/2023 21:41   ECHOCARDIOGRAM COMPLETE  Result Date: 01/20/2023    ECHOCARDIOGRAM REPORT   Patient Name:   Lisa Mcclure Date of Exam: 01/20/2023 Medical Rec #:  [id number]         Height:       63.0 in Accession #:    [id number]        Weight:       202.2 lb Date of Birth:  12-03-1932        BSA:          1.943 m Patient Age:    89 years          BP:           140/65 mmHg Patient Gender: F                 HR:           88 bpm. Exam Location:  [address] Procedure: 2D Echo, Cardiac Doppler and Color Doppler Indications:    I35.0 Nonrheumatic aortic (valve) stenosis  History:        Patient has prior history of Echocardiogram examinations, most                 recent 06/23/2022. CHF, Arrythmias:Atrial Fibrillation,                 Signs/Symptoms:Fatigue; Risk Factors:Sleep Apnea and Former                 Smoker. Bradycardia. Obesity. Hypertensive heart disease. Acute                 renal failure. Coronary calcification. S/P AAArepair.  Sonographer:    Cathie Beams RCS Referring Phys: Yates Decamp IMPRESSIONS  1. Left ventricular ejection fraction, by estimation, is 60 to 65%. The left ventricle has normal function. The left ventricle has no regional wall motion abnormalities. There is moderate concentric left ventricular hypertrophy. Left ventricular diastolic parameters are indeterminate.  2. Right ventricular systolic function is normal. The right ventricular size is normal.  Physician Discharge Summary   Patient: Lisa Mcclure MRN: [id number] DOB: 06-21-1932  Admit date:     02/02/2023  Discharge date: 02/07/23  Discharge Physician: Jacquelin Hawking, MD   PCP: Garlan Fillers, MD   Recommendations at discharge:  PCP follow-up Orthopedic surgery follow-up Follow-up sputum culture  Discharge Diagnoses: Principal Problem:   Closed left hip fracture [facility]) Active Problems:   Closed comminuted intertrochanteric fracture of proximal end of left femur (HCC)   Chronic diastolic CHF (congestive heart failure) (HCC)   Essential hypertension   Paroxysmal atrial fibrillation (HCC)   COPD (chronic obstructive pulmonary disease) (HCC)   OSA (obstructive sleep apnea)   Hypothyroidism   Severe aortic stenosis   S/P AAA repair   Left atrial thrombus  Resolved Problems:   Toxic effect of gadolinium, undetermined, subsequent encounter  Hospital Course: Lisa Mcclure is a 89 y.o. female with a history of heart failure, hypertension, paroxysmal atrial fibrillation, COPD, obstructive sleep apnea, severe aortic stenosis, history of AAA repair history of pseudoaneurysm sac repair of AAA, left atrial thrombus on Eliquis, hypothyroidism, generalized anxiety disorder.  Patient presented secondary to fall with resultant left hip pain and was found to have a left hip fracture.  Orthopedic surgery was consulted and performed ORIF.  Assessment and Plan:  Displaced intertrochanteric fracture of the left femur Secondary to trip and fall.  No associated loss of consciousness.  Initial x-ray significant for evidence of a comminuted displaced intertrochanteric left proximal femur fracture.  Orthopedic surgery was consulted with plan for surgical repair.  Case discussed with Dr. Jacinto Halim who recommends to proceed with surgery secondary to high morbidity of unrepaired fracture. Orthopedic surgery performed ORIF on 10/25. PT/OT recommending SNF. Weight bearing as tolerated (left  leg).   Postoperative anemia Expected. Mild. Some blood noted under upper dressing. Pre-operative hemoglobin of 13.9 g/dL. Hemoglobin stable at 10.4 g/dL.   Chronic diastolic heart failure Stable. Patient is on furosemide, Entresto and spironolactone as an outpatient. Continue Entresto, spironolactone and furosemide   Productive cough Possible aspiration related to recent surgery. Chest x-ray obtained and is without etiology for symptoms. No fever or leukocytosis. Possible bronchitis. Doxycycline x5 days.   Hypotension In setting of antihypertensives, recent anesthesia, scheduled narcotics and known aortic stenosis. Resolved.   Severe aortic stenosis Patient follows with cardiology with outpatient consideration for aortic valve replacement. Discussed with cardiology and there is no barrier to surgery regarding aortic stenosis when weighed against morbidity of no surgical fixation.   Paroxysmal atrial fibrillation Currently in sinus rhythm with ventricular trigeminy on EKG. Patient is managed on Eliquis for stroke risk reduction. She is not on rate or rhythm control. Eliquis transitioned to Heparin IV. Eliquis resumed. Continue Eliquis.   Left ventricular thrombus Patient is managed on Eliquis as an outpatient which is held secondary to need for surgery. Patient transitioned to Heparin IV and back to Eliquis post-operatively and prior to discharge.   Hypothyroidism Continue Synthroid.   COPD Stable and without exacerbation.   OSA Not on CPAP. Patient uses nocturnal oxygen.   Consultants:  Orthopedic surgery   Procedures:  10/25: ORIF  Disposition: Skilled nursing facility Diet recommendation: Cardiac diet   DISCHARGE MEDICATION: Allergies as of 02/07/2023       Reactions   Statins Other (See Comments)   Muscle aches and INTERNAL BLEEDING   Atorvastatin Other (See Comments)   Myalgias   Gabapentin Other (See Comments)   "Made me loopy"   Methocarbamol Other (See  Comments)   "  Physician Discharge Summary   Patient: Lisa Mcclure MRN: [id number] DOB: 06-21-1932  Admit date:     02/02/2023  Discharge date: 02/07/23  Discharge Physician: Jacquelin Hawking, MD   PCP: Garlan Fillers, MD   Recommendations at discharge:  PCP follow-up Orthopedic surgery follow-up Follow-up sputum culture  Discharge Diagnoses: Principal Problem:   Closed left hip fracture [facility]) Active Problems:   Closed comminuted intertrochanteric fracture of proximal end of left femur (HCC)   Chronic diastolic CHF (congestive heart failure) (HCC)   Essential hypertension   Paroxysmal atrial fibrillation (HCC)   COPD (chronic obstructive pulmonary disease) (HCC)   OSA (obstructive sleep apnea)   Hypothyroidism   Severe aortic stenosis   S/P AAA repair   Left atrial thrombus  Resolved Problems:   Toxic effect of gadolinium, undetermined, subsequent encounter  Hospital Course: Lisa Mcclure is a 89 y.o. female with a history of heart failure, hypertension, paroxysmal atrial fibrillation, COPD, obstructive sleep apnea, severe aortic stenosis, history of AAA repair history of pseudoaneurysm sac repair of AAA, left atrial thrombus on Eliquis, hypothyroidism, generalized anxiety disorder.  Patient presented secondary to fall with resultant left hip pain and was found to have a left hip fracture.  Orthopedic surgery was consulted and performed ORIF.  Assessment and Plan:  Displaced intertrochanteric fracture of the left femur Secondary to trip and fall.  No associated loss of consciousness.  Initial x-ray significant for evidence of a comminuted displaced intertrochanteric left proximal femur fracture.  Orthopedic surgery was consulted with plan for surgical repair.  Case discussed with Dr. Jacinto Halim who recommends to proceed with surgery secondary to high morbidity of unrepaired fracture. Orthopedic surgery performed ORIF on 10/25. PT/OT recommending SNF. Weight bearing as tolerated (left  leg).   Postoperative anemia Expected. Mild. Some blood noted under upper dressing. Pre-operative hemoglobin of 13.9 g/dL. Hemoglobin stable at 10.4 g/dL.   Chronic diastolic heart failure Stable. Patient is on furosemide, Entresto and spironolactone as an outpatient. Continue Entresto, spironolactone and furosemide   Productive cough Possible aspiration related to recent surgery. Chest x-ray obtained and is without etiology for symptoms. No fever or leukocytosis. Possible bronchitis. Doxycycline x5 days.   Hypotension In setting of antihypertensives, recent anesthesia, scheduled narcotics and known aortic stenosis. Resolved.   Severe aortic stenosis Patient follows with cardiology with outpatient consideration for aortic valve replacement. Discussed with cardiology and there is no barrier to surgery regarding aortic stenosis when weighed against morbidity of no surgical fixation.   Paroxysmal atrial fibrillation Currently in sinus rhythm with ventricular trigeminy on EKG. Patient is managed on Eliquis for stroke risk reduction. She is not on rate or rhythm control. Eliquis transitioned to Heparin IV. Eliquis resumed. Continue Eliquis.   Left ventricular thrombus Patient is managed on Eliquis as an outpatient which is held secondary to need for surgery. Patient transitioned to Heparin IV and back to Eliquis post-operatively and prior to discharge.   Hypothyroidism Continue Synthroid.   COPD Stable and without exacerbation.   OSA Not on CPAP. Patient uses nocturnal oxygen.   Consultants:  Orthopedic surgery   Procedures:  10/25: ORIF  Disposition: Skilled nursing facility Diet recommendation: Cardiac diet   DISCHARGE MEDICATION: Allergies as of 02/07/2023       Reactions   Statins Other (See Comments)   Muscle aches and INTERNAL BLEEDING   Atorvastatin Other (See Comments)   Myalgias   Gabapentin Other (See Comments)   "Made me loopy"   Methocarbamol Other (See  Comments)   "  of fluoroscopy. Electronically Signed   By: Thornell Sartorius M.D.   On: 02/03/2023 21:21   DG C-Arm 1-60 Min-No Report  Result Date: 02/03/2023 Fluoroscopy was utilized by the requesting physician.  No radiographic interpretation.   DG C-Arm 1-60 Min-No Report  Result Date: 02/03/2023 Fluoroscopy was utilized by the requesting physician.  No radiographic interpretation.   DG Chest Port 1 View  Result Date: 02/02/2023 CLINICAL DATA:  Fall with left hip fracture. EXAM: PORTABLE CHEST 1 VIEW COMPARISON:  11/29/2022 FINDINGS: Chronic cardiomegaly. Unchanged mediastinal contours. Aortic atherosclerosis. Chronic interstitial coarsening without focal airspace disease. No pleural effusion or pneumothorax.  Advanced chronic shoulder arthropathy. IMPRESSION: Chronic cardiomegaly and interstitial coarsening. No acute chest findings. Electronically Signed   By: Narda Rutherford M.D.   On: 02/02/2023 21:44   DG Hip Unilat With Pelvis 2-3 Views Left  Result Date: 02/02/2023 CLINICAL DATA:  Fall with left leg pain. EXAM: DG HIP (WITH OR WITHOUT PELVIS) 2-3V LEFT COMPARISON:  None Available. FINDINGS: Comminuted displaced intertrochanteric left proximal femur fracture mild proximal migration of the femoral shaft. The femoral head is well seated. No hip dislocation. Bilateral narrowing hip osteoarthritis. Bony pelvis is intact. No rami fractures. No pubic symphyseal or sacroiliac diastasis. Aorto bi-iliac stent graft partially included in the field of view. Suspect prior bladder tack. IMPRESSION: Comminuted displaced intertrochanteric left proximal femur fracture. Electronically Signed   By: Narda Rutherford M.D.   On: 02/02/2023 21:41   ECHOCARDIOGRAM COMPLETE  Result Date: 01/20/2023    ECHOCARDIOGRAM REPORT   Patient Name:   Lisa Mcclure Date of Exam: 01/20/2023 Medical Rec #:  [id number]         Height:       63.0 in Accession #:    [id number]        Weight:       202.2 lb Date of Birth:  12-03-1932        BSA:          1.943 m Patient Age:    89 years          BP:           140/65 mmHg Patient Gender: F                 HR:           88 bpm. Exam Location:  [address] Procedure: 2D Echo, Cardiac Doppler and Color Doppler Indications:    I35.0 Nonrheumatic aortic (valve) stenosis  History:        Patient has prior history of Echocardiogram examinations, most                 recent 06/23/2022. CHF, Arrythmias:Atrial Fibrillation,                 Signs/Symptoms:Fatigue; Risk Factors:Sleep Apnea and Former                 Smoker. Bradycardia. Obesity. Hypertensive heart disease. Acute                 renal failure. Coronary calcification. S/P AAArepair.  Sonographer:    Cathie Beams RCS Referring Phys: Yates Decamp IMPRESSIONS  1. Left ventricular ejection fraction, by estimation, is 60 to 65%. The left ventricle has normal function. The left ventricle has no regional wall motion abnormalities. There is moderate concentric left ventricular hypertrophy. Left ventricular diastolic parameters are indeterminate.  2. Right ventricular systolic function is normal. The right ventricular size is normal.  of fluoroscopy. Electronically Signed   By: Thornell Sartorius M.D.   On: 02/03/2023 21:21   DG C-Arm 1-60 Min-No Report  Result Date: 02/03/2023 Fluoroscopy was utilized by the requesting physician.  No radiographic interpretation.   DG C-Arm 1-60 Min-No Report  Result Date: 02/03/2023 Fluoroscopy was utilized by the requesting physician.  No radiographic interpretation.   DG Chest Port 1 View  Result Date: 02/02/2023 CLINICAL DATA:  Fall with left hip fracture. EXAM: PORTABLE CHEST 1 VIEW COMPARISON:  11/29/2022 FINDINGS: Chronic cardiomegaly. Unchanged mediastinal contours. Aortic atherosclerosis. Chronic interstitial coarsening without focal airspace disease. No pleural effusion or pneumothorax.  Advanced chronic shoulder arthropathy. IMPRESSION: Chronic cardiomegaly and interstitial coarsening. No acute chest findings. Electronically Signed   By: Narda Rutherford M.D.   On: 02/02/2023 21:44   DG Hip Unilat With Pelvis 2-3 Views Left  Result Date: 02/02/2023 CLINICAL DATA:  Fall with left leg pain. EXAM: DG HIP (WITH OR WITHOUT PELVIS) 2-3V LEFT COMPARISON:  None Available. FINDINGS: Comminuted displaced intertrochanteric left proximal femur fracture mild proximal migration of the femoral shaft. The femoral head is well seated. No hip dislocation. Bilateral narrowing hip osteoarthritis. Bony pelvis is intact. No rami fractures. No pubic symphyseal or sacroiliac diastasis. Aorto bi-iliac stent graft partially included in the field of view. Suspect prior bladder tack. IMPRESSION: Comminuted displaced intertrochanteric left proximal femur fracture. Electronically Signed   By: Narda Rutherford M.D.   On: 02/02/2023 21:41   ECHOCARDIOGRAM COMPLETE  Result Date: 01/20/2023    ECHOCARDIOGRAM REPORT   Patient Name:   Lisa Mcclure Date of Exam: 01/20/2023 Medical Rec #:  [id number]         Height:       63.0 in Accession #:    [id number]        Weight:       202.2 lb Date of Birth:  12-03-1932        BSA:          1.943 m Patient Age:    89 years          BP:           140/65 mmHg Patient Gender: F                 HR:           88 bpm. Exam Location:  [address] Procedure: 2D Echo, Cardiac Doppler and Color Doppler Indications:    I35.0 Nonrheumatic aortic (valve) stenosis  History:        Patient has prior history of Echocardiogram examinations, most                 recent 06/23/2022. CHF, Arrythmias:Atrial Fibrillation,                 Signs/Symptoms:Fatigue; Risk Factors:Sleep Apnea and Former                 Smoker. Bradycardia. Obesity. Hypertensive heart disease. Acute                 renal failure. Coronary calcification. S/P AAArepair.  Sonographer:    Cathie Beams RCS Referring Phys: Yates Decamp IMPRESSIONS  1. Left ventricular ejection fraction, by estimation, is 60 to 65%. The left ventricle has normal function. The left ventricle has no regional wall motion abnormalities. There is moderate concentric left ventricular hypertrophy. Left ventricular diastolic parameters are indeterminate.  2. Right ventricular systolic function is normal. The right ventricular size is normal.  of fluoroscopy. Electronically Signed   By: Thornell Sartorius M.D.   On: 02/03/2023 21:21   DG C-Arm 1-60 Min-No Report  Result Date: 02/03/2023 Fluoroscopy was utilized by the requesting physician.  No radiographic interpretation.   DG C-Arm 1-60 Min-No Report  Result Date: 02/03/2023 Fluoroscopy was utilized by the requesting physician.  No radiographic interpretation.   DG Chest Port 1 View  Result Date: 02/02/2023 CLINICAL DATA:  Fall with left hip fracture. EXAM: PORTABLE CHEST 1 VIEW COMPARISON:  11/29/2022 FINDINGS: Chronic cardiomegaly. Unchanged mediastinal contours. Aortic atherosclerosis. Chronic interstitial coarsening without focal airspace disease. No pleural effusion or pneumothorax.  Advanced chronic shoulder arthropathy. IMPRESSION: Chronic cardiomegaly and interstitial coarsening. No acute chest findings. Electronically Signed   By: Narda Rutherford M.D.   On: 02/02/2023 21:44   DG Hip Unilat With Pelvis 2-3 Views Left  Result Date: 02/02/2023 CLINICAL DATA:  Fall with left leg pain. EXAM: DG HIP (WITH OR WITHOUT PELVIS) 2-3V LEFT COMPARISON:  None Available. FINDINGS: Comminuted displaced intertrochanteric left proximal femur fracture mild proximal migration of the femoral shaft. The femoral head is well seated. No hip dislocation. Bilateral narrowing hip osteoarthritis. Bony pelvis is intact. No rami fractures. No pubic symphyseal or sacroiliac diastasis. Aorto bi-iliac stent graft partially included in the field of view. Suspect prior bladder tack. IMPRESSION: Comminuted displaced intertrochanteric left proximal femur fracture. Electronically Signed   By: Narda Rutherford M.D.   On: 02/02/2023 21:41   ECHOCARDIOGRAM COMPLETE  Result Date: 01/20/2023    ECHOCARDIOGRAM REPORT   Patient Name:   Lisa Mcclure Date of Exam: 01/20/2023 Medical Rec #:  [id number]         Height:       63.0 in Accession #:    [id number]        Weight:       202.2 lb Date of Birth:  12-03-1932        BSA:          1.943 m Patient Age:    89 years          BP:           140/65 mmHg Patient Gender: F                 HR:           88 bpm. Exam Location:  [address] Procedure: 2D Echo, Cardiac Doppler and Color Doppler Indications:    I35.0 Nonrheumatic aortic (valve) stenosis  History:        Patient has prior history of Echocardiogram examinations, most                 recent 06/23/2022. CHF, Arrythmias:Atrial Fibrillation,                 Signs/Symptoms:Fatigue; Risk Factors:Sleep Apnea and Former                 Smoker. Bradycardia. Obesity. Hypertensive heart disease. Acute                 renal failure. Coronary calcification. S/P AAArepair.  Sonographer:    Cathie Beams RCS Referring Phys: Yates Decamp IMPRESSIONS  1. Left ventricular ejection fraction, by estimation, is 60 to 65%. The left ventricle has normal function. The left ventricle has no regional wall motion abnormalities. There is moderate concentric left ventricular hypertrophy. Left ventricular diastolic parameters are indeterminate.  2. Right ventricular systolic function is normal. The right ventricular size is normal.  of fluoroscopy. Electronically Signed   By: Thornell Sartorius M.D.   On: 02/03/2023 21:21   DG C-Arm 1-60 Min-No Report  Result Date: 02/03/2023 Fluoroscopy was utilized by the requesting physician.  No radiographic interpretation.   DG C-Arm 1-60 Min-No Report  Result Date: 02/03/2023 Fluoroscopy was utilized by the requesting physician.  No radiographic interpretation.   DG Chest Port 1 View  Result Date: 02/02/2023 CLINICAL DATA:  Fall with left hip fracture. EXAM: PORTABLE CHEST 1 VIEW COMPARISON:  11/29/2022 FINDINGS: Chronic cardiomegaly. Unchanged mediastinal contours. Aortic atherosclerosis. Chronic interstitial coarsening without focal airspace disease. No pleural effusion or pneumothorax.  Advanced chronic shoulder arthropathy. IMPRESSION: Chronic cardiomegaly and interstitial coarsening. No acute chest findings. Electronically Signed   By: Narda Rutherford M.D.   On: 02/02/2023 21:44   DG Hip Unilat With Pelvis 2-3 Views Left  Result Date: 02/02/2023 CLINICAL DATA:  Fall with left leg pain. EXAM: DG HIP (WITH OR WITHOUT PELVIS) 2-3V LEFT COMPARISON:  None Available. FINDINGS: Comminuted displaced intertrochanteric left proximal femur fracture mild proximal migration of the femoral shaft. The femoral head is well seated. No hip dislocation. Bilateral narrowing hip osteoarthritis. Bony pelvis is intact. No rami fractures. No pubic symphyseal or sacroiliac diastasis. Aorto bi-iliac stent graft partially included in the field of view. Suspect prior bladder tack. IMPRESSION: Comminuted displaced intertrochanteric left proximal femur fracture. Electronically Signed   By: Narda Rutherford M.D.   On: 02/02/2023 21:41   ECHOCARDIOGRAM COMPLETE  Result Date: 01/20/2023    ECHOCARDIOGRAM REPORT   Patient Name:   Lisa Mcclure Date of Exam: 01/20/2023 Medical Rec #:  [id number]         Height:       63.0 in Accession #:    [id number]        Weight:       202.2 lb Date of Birth:  12-03-1932        BSA:          1.943 m Patient Age:    89 years          BP:           140/65 mmHg Patient Gender: F                 HR:           88 bpm. Exam Location:  [address] Procedure: 2D Echo, Cardiac Doppler and Color Doppler Indications:    I35.0 Nonrheumatic aortic (valve) stenosis  History:        Patient has prior history of Echocardiogram examinations, most                 recent 06/23/2022. CHF, Arrythmias:Atrial Fibrillation,                 Signs/Symptoms:Fatigue; Risk Factors:Sleep Apnea and Former                 Smoker. Bradycardia. Obesity. Hypertensive heart disease. Acute                 renal failure. Coronary calcification. S/P AAArepair.  Sonographer:    Cathie Beams RCS Referring Phys: Yates Decamp IMPRESSIONS  1. Left ventricular ejection fraction, by estimation, is 60 to 65%. The left ventricle has normal function. The left ventricle has no regional wall motion abnormalities. There is moderate concentric left ventricular hypertrophy. Left ventricular diastolic parameters are indeterminate.  2. Right ventricular systolic function is normal. The right ventricular size is normal.

## 2023-02-07 NOTE — Consult Note (Signed)
[facility]   Consult   02/07/2023  Lisa Mcclure [birth year] [id number]  [facility] [[facility]]  Primary Care Provider:  Garlan Fillers, MD, [facility]   Insurance: Medicare ACO REACH  Patient was reviewed for high risk readmission high risk score 4 day length of stay for barriers to care in community.  Patient was screened for hospitalization and on behalf of [facility]  Care Coordination to assess for post hospital community care needs.  Patient transitioned to [facility] skilled nursing facility level of care for post hospital transition.  This is a [facility] affiliated facility then, patient can be followed by [facility] RN with traditional Medicare and approved Medicare Advantage plans.    Plan:   Will notify the Community [facility] RN can follow for any known or needs for transitional care needs for returning to post facility care coordination needs to return to community.  For questions or referrals, please contact:   Charlesetta Shanks, RN, BSN, CCM [place]  [facility], [facility] Liaison Direct Dial: [phone] or secure chat Website: [email or web address]

## 2023-02-07 NOTE — Progress Notes (Signed)
[name], female   DOB: 07/29/1932, 89 y.o.   MRN: [id number] The patient is now 4 days postop status post intramedullary nail fixation of a complex left proximal femur fracture.  She is awake and alert this morning.  Her vital signs are stable.  Her left operative hip is stable.  I did change the dressings and the incisions are both dry and intact.  She does report that she is going to skilled nursing potentially even today.  She can be up but only with assistance with weightbearing as tolerated on that left hip.  She is back on her Eliquis.  We will see her in the office in 2 weeks.

## 2023-02-07 NOTE — Plan of Care (Signed)
  Problem: Education: Goal: Knowledge of General Education information will improve Description: Including pain rating scale, medication(s)/side effects and non-pharmacologic comfort measures Outcome: Adequate for Discharge   Problem: Health Behavior/Discharge Planning: Goal: Ability to manage health-related needs will improve Outcome: Adequate for Discharge   Problem: Clinical Measurements: Goal: Ability to maintain clinical measurements within normal limits will improve Outcome: Adequate for Discharge Goal: Will remain free from infection Outcome: Adequate for Discharge Goal: Diagnostic test results will improve Outcome: Adequate for Discharge Goal: Respiratory complications will improve Outcome: Adequate for Discharge Goal: Cardiovascular complication will be avoided Outcome: Adequate for Discharge   Problem: Activity: Goal: Risk for activity intolerance will decrease Outcome: Adequate for Discharge   Problem: Nutrition: Goal: Adequate nutrition will be maintained Outcome: Adequate for Discharge   Problem: Coping: Goal: Level of anxiety will decrease Outcome: Adequate for Discharge   Problem: Elimination: Goal: Will not experience complications related to bowel motility Outcome: Adequate for Discharge Goal: Will not experience complications related to urinary retention Outcome: Adequate for Discharge   Problem: Pain Management: Goal: General experience of comfort will improve Outcome: Adequate for Discharge   Problem: Safety: Goal: Ability to remain free from injury will improve Outcome: Adequate for Discharge   Problem: Skin Integrity: Goal: Risk for impaired skin integrity will decrease Outcome: Adequate for Discharge   Problem: Education: Goal: Verbalization of understanding the information provided (i.e., activity precautions, restrictions, etc) will improve Outcome: Adequate for Discharge Goal: Individualized Educational Video(s) Outcome: Adequate for  Discharge   Problem: Activity: Goal: Ability to ambulate and perform ADLs will improve Outcome: Adequate for Discharge   Problem: Clinical Measurements: Goal: Postoperative complications will be avoided or minimized Outcome: Adequate for Discharge   Problem: Self-Concept: Goal: Ability to maintain and perform role responsibilities to the fullest extent possible will improve Outcome: Adequate for Discharge   Problem: Pain Management: Goal: Pain level will decrease Outcome: Adequate for Discharge

## 2023-02-07 NOTE — Progress Notes (Signed)
RT attempted to give pulmicort x2 but pt asked  RT to return later.

## 2023-02-07 NOTE — Progress Notes (Signed)
Attempted to call report to receiving facility. No answer. Voicemail left for callback.

## 2023-02-07 NOTE — Discharge Instructions (Addendum)
Lisa Mcclure,  You were in the hospital with a left hip fracture. This was repaired by the orthopedic surgery. You have been referred to skilled nursing for rehabilitation.   The patient can be up with assistance with attempted full weightbearing as tolerated on her left operative hip. The dressings on her left hip can be changed as needed.

## 2023-02-07 NOTE — TOC Transition Note (Signed)
Transition of Care [facility]) - CM/SW Discharge Note   Patient Details  Name: Lisa Mcclure MRN: [id number] Date of Birth: 09-09-1932  Transition of Care [facility]) CM/SW Contact:  Lorri Frederick, LCSW Phone Number: 02/07/2023, 10:37 AM   Clinical Narrative:   Pt discharging to [facility].  RN call [phone] for report.      Final next level of care: Skilled Nursing Facility Barriers to Discharge: Barriers Resolved   Patient Goals and CMS Choice CMS Medicare.gov Compare Post Acute Care list provided to:: Patient Choice offered to / list presented to : Patient, Adult Children  Discharge Placement                Patient chooses bed at: [facility] Patient to be transferred to facility by: ptar Name of family member notified: daughter Isaiah Serge Patient and family notified of of transfer: 02/07/23  Discharge Plan and Services Additional resources added to the After Visit Summary for   In-house Referral: Clinical Social Work   Post Acute Care Choice: Skilled Nursing Facility                               Social Determinants of Health (SDOH) Interventions SDOH Screenings   Food Insecurity: No Food Insecurity (02/03/2023)  Housing: Low Risk  (02/03/2023)  Transportation Needs: No Transportation Needs (02/03/2023)  Utilities: Not At Risk (02/03/2023)  Alcohol Screen: Low Risk  (08/11/2021)  Depression (PHQ2-9): Medium Risk (08/11/2021)  Financial Resource Strain: Low Risk  (08/11/2021)  Physical Activity: Inactive (08/11/2021)  Social Connections: Moderately Isolated (08/11/2021)  Stress: No Stress Concern Present (08/11/2021)  Tobacco Use: Medium Risk (02/03/2023)     Readmission Risk Interventions    12/02/2022   10:45 AM 06/21/2022    4:57 PM  Readmission Risk Prevention Plan  Transportation Screening Complete Complete  PCP or Specialist Appt within 3-5 Days Complete Complete  HRI or Home Care Consult Complete Complete  Social Work Consult for Recovery Care  Planning/Counseling Complete   Palliative Care Screening Not Applicable Not Applicable  Medication Review Oceanographer) Referral to Pharmacy Complete

## 2023-02-09 DIAGNOSIS — I4819 Other persistent atrial fibrillation: Secondary | ICD-10-CM | POA: Diagnosis not present

## 2023-02-09 DIAGNOSIS — I509 Heart failure, unspecified: Secondary | ICD-10-CM | POA: Diagnosis not present

## 2023-02-09 DIAGNOSIS — I714 Abdominal aortic aneurysm, without rupture, unspecified: Secondary | ICD-10-CM | POA: Diagnosis not present

## 2023-02-09 DIAGNOSIS — E039 Hypothyroidism, unspecified: Secondary | ICD-10-CM | POA: Diagnosis not present

## 2023-02-13 ENCOUNTER — Other Ambulatory Visit: Payer: Self-pay | Admitting: *Deleted

## 2023-02-13 DIAGNOSIS — M25552 Pain in left hip: Secondary | ICD-10-CM | POA: Diagnosis not present

## 2023-02-13 DIAGNOSIS — M79652 Pain in left thigh: Secondary | ICD-10-CM | POA: Diagnosis not present

## 2023-02-13 NOTE — Patient Outreach (Signed)
Lisa Mcclure resides in [facility].  Screening for potential chronic care management services as a benefit of health plan and primary care provider.  Collaboration telephonic meeting with Deseree, SNF social worker. Care plan meeting was today. Lisa Mcclure is from home alone. Max assist currently with therapy. No longer on [facility] waiting list. Transition plans are pending.   Will continue to follow.   Raiford Noble, MSN, RN, BSN [place]  [facility], [facility] Commun[facility] RN Post- Acute Care Coordinator Direct Dial: [phone]

## 2023-02-16 DIAGNOSIS — I714 Abdominal aortic aneurysm, without rupture, unspecified: Secondary | ICD-10-CM | POA: Diagnosis not present

## 2023-02-16 DIAGNOSIS — G4733 Obstructive sleep apnea (adult) (pediatric): Secondary | ICD-10-CM | POA: Diagnosis not present

## 2023-02-16 DIAGNOSIS — J449 Chronic obstructive pulmonary disease, unspecified: Secondary | ICD-10-CM | POA: Diagnosis not present

## 2023-02-16 DIAGNOSIS — I872 Venous insufficiency (chronic) (peripheral): Secondary | ICD-10-CM | POA: Diagnosis not present

## 2023-02-16 DIAGNOSIS — I480 Paroxysmal atrial fibrillation: Secondary | ICD-10-CM | POA: Diagnosis not present

## 2023-02-16 DIAGNOSIS — I3139 Other pericardial effusion (noninflammatory): Secondary | ICD-10-CM | POA: Diagnosis not present

## 2023-02-16 DIAGNOSIS — E039 Hypothyroidism, unspecified: Secondary | ICD-10-CM | POA: Diagnosis not present

## 2023-02-16 DIAGNOSIS — I4819 Other persistent atrial fibrillation: Secondary | ICD-10-CM | POA: Diagnosis not present

## 2023-02-16 DIAGNOSIS — I350 Nonrheumatic aortic (valve) stenosis: Secondary | ICD-10-CM | POA: Diagnosis not present

## 2023-02-16 DIAGNOSIS — I5043 Acute on chronic combined systolic (congestive) and diastolic (congestive) heart failure: Secondary | ICD-10-CM | POA: Diagnosis not present

## 2023-02-16 DIAGNOSIS — I513 Intracardiac thrombosis, not elsewhere classified: Secondary | ICD-10-CM | POA: Diagnosis not present

## 2023-02-16 DIAGNOSIS — N1832 Chronic kidney disease, stage 3b: Secondary | ICD-10-CM | POA: Diagnosis not present

## 2023-02-16 DIAGNOSIS — I130 Hypertensive heart and chronic kidney disease with heart failure and stage 1 through stage 4 chronic kidney disease, or unspecified chronic kidney disease: Secondary | ICD-10-CM | POA: Diagnosis not present

## 2023-02-16 DIAGNOSIS — D631 Anemia in chronic kidney disease: Secondary | ICD-10-CM | POA: Diagnosis not present

## 2023-02-16 DIAGNOSIS — I509 Heart failure, unspecified: Secondary | ICD-10-CM | POA: Diagnosis not present

## 2023-02-16 DIAGNOSIS — M069 Rheumatoid arthritis, unspecified: Secondary | ICD-10-CM | POA: Diagnosis not present

## 2023-02-21 ENCOUNTER — Other Ambulatory Visit (HOSPITAL_COMMUNITY): Payer: Medicare Other

## 2023-02-21 ENCOUNTER — Telehealth: Payer: Self-pay | Admitting: Orthopaedic Surgery

## 2023-02-21 ENCOUNTER — Ambulatory Visit: Payer: Medicare Other | Admitting: Vascular Surgery

## 2023-02-21 DIAGNOSIS — G8911 Acute pain due to trauma: Secondary | ICD-10-CM | POA: Diagnosis not present

## 2023-02-21 NOTE — Telephone Encounter (Signed)
Pt daughter would like a call concerning her mothers surgery and care she didn't nor get the discharge paperwork from the hospital and has some questions please call Isaiah Serge at  [phone]

## 2023-02-22 ENCOUNTER — Ambulatory Visit (INDEPENDENT_AMBULATORY_CARE_PROVIDER_SITE_OTHER): Payer: Medicare Other | Admitting: Orthopaedic Surgery

## 2023-02-22 ENCOUNTER — Other Ambulatory Visit (INDEPENDENT_AMBULATORY_CARE_PROVIDER_SITE_OTHER): Payer: Self-pay

## 2023-02-22 ENCOUNTER — Encounter: Payer: Self-pay | Admitting: Orthopaedic Surgery

## 2023-02-22 DIAGNOSIS — Z8781 Personal history of (healed) traumatic fracture: Secondary | ICD-10-CM | POA: Diagnosis not present

## 2023-02-22 DIAGNOSIS — S72142D Displaced intertrochanteric fracture of left femur, subsequent encounter for closed fracture with routine healing: Secondary | ICD-10-CM

## 2023-02-22 DIAGNOSIS — Z9889 Other specified postprocedural states: Secondary | ICD-10-CM

## 2023-02-22 NOTE — Progress Notes (Signed)
The patient is an 89 year old female who is 2-1/2 weeks status post surgical fixation of a left complex intertrochanteric hip fracture.  She has been recovering in a skilled nursing facility.  Her daughter is with her today as well.  Her daughter reports that she walked the for this today but she is walked the whole time since surgery.  She does understand that her left hip fracture was quite complex.  All staples have been removed and Steri-Strips applied.  There is no issues per report of her incisions.  She tolerates me putting her left hip through rotation but it is painful to be expected as well.  X-rays of the pelvis and left femur show that the fracture is in appropriate position for healing.  There is slight evidence of healing and no complicating features of the hardware.  She will continue attempts to get up and weight-bear as tolerated.  She does have a history of old arthroplasties in her knees and she feels like sometimes her right knee is unstable so I have recommended at least a knee sleeve to try.  Her daughter will get that for her.  From an orthopedic standpoint, we will see her back in 4 weeks and at that visit I would like to see an AP and lateral of her left hip.  We do not need to see all the way down the femur and do not need to see the pelvis.  This can be done with her supine since she may have difficulty standing.

## 2023-02-23 ENCOUNTER — Telehealth: Payer: Self-pay | Admitting: *Deleted

## 2023-02-23 ENCOUNTER — Ambulatory Visit: Payer: Medicare Other | Admitting: Cardiovascular Disease

## 2023-02-23 DIAGNOSIS — E039 Hypothyroidism, unspecified: Secondary | ICD-10-CM | POA: Diagnosis not present

## 2023-02-23 DIAGNOSIS — J449 Chronic obstructive pulmonary disease, unspecified: Secondary | ICD-10-CM | POA: Diagnosis not present

## 2023-02-23 DIAGNOSIS — I509 Heart failure, unspecified: Secondary | ICD-10-CM | POA: Diagnosis not present

## 2023-02-23 DIAGNOSIS — I4819 Other persistent atrial fibrillation: Secondary | ICD-10-CM | POA: Diagnosis not present

## 2023-02-23 NOTE — Telephone Encounter (Deleted)
Patient

## 2023-02-23 NOTE — Telephone Encounter (Addendum)
R/LHC has been cancelled at patient's request, 02/23/23 appointment with Dr Excell Seltzer had already been cancelled. Patient tells me she had recent fall and broke her femur, feels she needs more recovery time before proceeding with TAVR evaluation, plans to call when she is ready to proceed.

## 2023-02-27 DIAGNOSIS — J449 Chronic obstructive pulmonary disease, unspecified: Secondary | ICD-10-CM | POA: Diagnosis not present

## 2023-02-27 DIAGNOSIS — I509 Heart failure, unspecified: Secondary | ICD-10-CM | POA: Diagnosis not present

## 2023-02-27 DIAGNOSIS — E039 Hypothyroidism, unspecified: Secondary | ICD-10-CM | POA: Diagnosis not present

## 2023-02-27 DIAGNOSIS — I4819 Other persistent atrial fibrillation: Secondary | ICD-10-CM | POA: Diagnosis not present

## 2023-02-28 ENCOUNTER — Ambulatory Visit: Payer: Medicare Other | Admitting: Vascular Surgery

## 2023-02-28 ENCOUNTER — Other Ambulatory Visit (HOSPITAL_COMMUNITY): Payer: Medicare Other

## 2023-02-28 ENCOUNTER — Telehealth: Payer: Self-pay | Admitting: Cardiology

## 2023-02-28 NOTE — Telephone Encounter (Signed)
Pt's daughter would like a c/b regarding pt being in Rehab as well as medications. Please advise

## 2023-02-28 NOTE — Telephone Encounter (Signed)
Left message to call office

## 2023-03-02 ENCOUNTER — Telehealth: Payer: Self-pay | Admitting: *Deleted

## 2023-03-02 ENCOUNTER — Ambulatory Visit (HOSPITAL_COMMUNITY): Admission: RE | Admit: 2023-03-02 | Payer: Medicare Other | Source: Home / Self Care | Admitting: Cardiology

## 2023-03-02 ENCOUNTER — Encounter (HOSPITAL_COMMUNITY): Admission: RE | Payer: Medicare Other | Source: Home / Self Care

## 2023-03-02 SURGERY — RIGHT/LEFT HEART CATH AND CORONARY ANGIOGRAPHY
Anesthesia: LOCAL

## 2023-03-02 NOTE — Telephone Encounter (Signed)
Pt called stating she is in a rehab facility due to a fall. She has a partial knee replacement that was done years ago and her knee is swollen & painful. Pt would like a call back to discuss what her options are.

## 2023-03-03 ENCOUNTER — Telehealth: Payer: Self-pay | Admitting: Orthopaedic Surgery

## 2023-03-03 NOTE — Telephone Encounter (Signed)
Ok to add her to your schedule today?

## 2023-03-03 NOTE — Telephone Encounter (Signed)
I would be happy to see her for her knee.  Often rehab facilities will offer transportation.  Feel free to schedule an appointment with me as needed.

## 2023-03-03 NOTE — Telephone Encounter (Signed)
Called and offered pt to see Jean Rosenthal today by 3pm. She will see if she can get a ride and call us back

## 2023-03-03 NOTE — Telephone Encounter (Signed)
Patient called and said that she needs to be seen today because her right knee in pain. CB#[phone] She said that she is on pain medication now for the fracture but not working.

## 2023-03-03 NOTE — Telephone Encounter (Signed)
Spoke to pt and advised she needed to contact her surgeon, Dr. Magnus Ivan. Provided the phone number for [facility].

## 2023-03-06 ENCOUNTER — Other Ambulatory Visit: Payer: Self-pay | Admitting: Family Medicine

## 2023-03-06 ENCOUNTER — Other Ambulatory Visit: Payer: Self-pay

## 2023-03-06 ENCOUNTER — Ambulatory Visit (INDEPENDENT_AMBULATORY_CARE_PROVIDER_SITE_OTHER): Payer: Medicare Other

## 2023-03-06 ENCOUNTER — Ambulatory Visit (INDEPENDENT_AMBULATORY_CARE_PROVIDER_SITE_OTHER): Payer: Medicare Other | Admitting: Family Medicine

## 2023-03-06 ENCOUNTER — Encounter: Payer: Self-pay | Admitting: Family Medicine

## 2023-03-06 VITALS — BP 126/82 | Ht 63.0 in

## 2023-03-06 DIAGNOSIS — M25552 Pain in left hip: Secondary | ICD-10-CM

## 2023-03-06 DIAGNOSIS — I4891 Unspecified atrial fibrillation: Secondary | ICD-10-CM

## 2023-03-06 DIAGNOSIS — M25462 Effusion, left knee: Secondary | ICD-10-CM | POA: Diagnosis not present

## 2023-03-06 DIAGNOSIS — M25562 Pain in left knee: Secondary | ICD-10-CM

## 2023-03-06 DIAGNOSIS — I480 Paroxysmal atrial fibrillation: Secondary | ICD-10-CM

## 2023-03-06 NOTE — Progress Notes (Unsigned)
   I, Stevenson Clinch, CMA acting as a scribe for Clementeen Graham, MD.  Lisa Mcclure is a 89 y.o. female who presents to [facility] today for L knee pain. Pt was previously seen by Dr. Denyse Amass on 12/16/22 for bilat shoulder pain.  Today, pt c/o L knee pain. Of note, pt suffered a fall on Oct 24th, resulting in a fx of her L hip, that was repaired by Dr. Magnus Ivan. Wearing soft knee brace and wheelchair bound today.    Pertinent review of systems: ***  Relevant historical information: ***   Exam:  There were no vitals taken for this visit. General: Well Developed, well nourished, and in no acute distress.   MSK: ***    Lab and Radiology Results No results found for this or any previous visit (from the past 72 hour(s)). No results found.     Assessment and Plan: 89 y.o. female with ***   PDMP not reviewed this encounter. No orders of the defined types were placed in this encounter.  No orders of the defined types were placed in this encounter.    Discussed warning signs or symptoms. Please see discharge instructions. Patient expresses understanding.   ***

## 2023-03-06 NOTE — Patient Instructions (Signed)
Thank you for coming in today.   If you are improving in 1 week, please call me and I will order a CT

## 2023-03-06 NOTE — Telephone Encounter (Signed)
Pt's pharmacy [facility] is requesting a refill of eliquis

## 2023-03-06 NOTE — Telephone Encounter (Signed)
Prescription refill request for Eliquis received. Indication: PAF Last office visit: 01/25/23  Jeanella Cara MD Scr: 0.89 on 02/03/23  Epic Age: 89 Weight: 92.5kg  Pt is currently taking Eliquis 2.5mg  twice daily.  Please advise on continuing current dose or increasing dose.

## 2023-03-07 ENCOUNTER — Other Ambulatory Visit (HOSPITAL_COMMUNITY): Payer: Medicare Other

## 2023-03-07 ENCOUNTER — Ambulatory Visit: Payer: Medicare Other | Admitting: Vascular Surgery

## 2023-03-12 DIAGNOSIS — Z96652 Presence of left artificial knee joint: Secondary | ICD-10-CM | POA: Diagnosis not present

## 2023-03-12 DIAGNOSIS — R2689 Other abnormalities of gait and mobility: Secondary | ICD-10-CM | POA: Diagnosis not present

## 2023-03-12 DIAGNOSIS — G2581 Restless legs syndrome: Secondary | ICD-10-CM | POA: Diagnosis not present

## 2023-03-12 DIAGNOSIS — F331 Major depressive disorder, recurrent, moderate: Secondary | ICD-10-CM | POA: Diagnosis not present

## 2023-03-12 DIAGNOSIS — M1712 Unilateral primary osteoarthritis, left knee: Secondary | ICD-10-CM | POA: Diagnosis not present

## 2023-03-12 DIAGNOSIS — M19041 Primary osteoarthritis, right hand: Secondary | ICD-10-CM | POA: Diagnosis not present

## 2023-03-12 DIAGNOSIS — R278 Other lack of coordination: Secondary | ICD-10-CM | POA: Diagnosis not present

## 2023-03-12 DIAGNOSIS — Z9889 Other specified postprocedural states: Secondary | ICD-10-CM | POA: Diagnosis not present

## 2023-03-12 DIAGNOSIS — E875 Hyperkalemia: Secondary | ICD-10-CM | POA: Diagnosis not present

## 2023-03-12 DIAGNOSIS — N1832 Chronic kidney disease, stage 3b: Secondary | ICD-10-CM | POA: Diagnosis not present

## 2023-03-12 DIAGNOSIS — F411 Generalized anxiety disorder: Secondary | ICD-10-CM | POA: Diagnosis not present

## 2023-03-12 DIAGNOSIS — I509 Heart failure, unspecified: Secondary | ICD-10-CM | POA: Diagnosis not present

## 2023-03-12 DIAGNOSIS — R41841 Cognitive communication deficit: Secondary | ICD-10-CM | POA: Diagnosis not present

## 2023-03-12 DIAGNOSIS — J449 Chronic obstructive pulmonary disease, unspecified: Secondary | ICD-10-CM | POA: Diagnosis not present

## 2023-03-12 DIAGNOSIS — I4819 Other persistent atrial fibrillation: Secondary | ICD-10-CM | POA: Diagnosis not present

## 2023-03-12 DIAGNOSIS — S72352D Displaced comminuted fracture of shaft of left femur, subsequent encounter for closed fracture with routine healing: Secondary | ICD-10-CM | POA: Diagnosis not present

## 2023-03-12 DIAGNOSIS — I714 Abdominal aortic aneurysm, without rupture, unspecified: Secondary | ICD-10-CM | POA: Diagnosis not present

## 2023-03-12 DIAGNOSIS — Z8781 Personal history of (healed) traumatic fracture: Secondary | ICD-10-CM | POA: Diagnosis not present

## 2023-03-12 DIAGNOSIS — E039 Hypothyroidism, unspecified: Secondary | ICD-10-CM | POA: Diagnosis not present

## 2023-03-12 DIAGNOSIS — H409 Unspecified glaucoma: Secondary | ICD-10-CM | POA: Diagnosis not present

## 2023-03-12 DIAGNOSIS — M6281 Muscle weakness (generalized): Secondary | ICD-10-CM | POA: Diagnosis not present

## 2023-03-12 DIAGNOSIS — K219 Gastro-esophageal reflux disease without esophagitis: Secondary | ICD-10-CM | POA: Diagnosis not present

## 2023-03-12 DIAGNOSIS — M25462 Effusion, left knee: Secondary | ICD-10-CM | POA: Diagnosis not present

## 2023-03-12 DIAGNOSIS — K5900 Constipation, unspecified: Secondary | ICD-10-CM | POA: Diagnosis not present

## 2023-03-12 DIAGNOSIS — G4700 Insomnia, unspecified: Secondary | ICD-10-CM | POA: Diagnosis not present

## 2023-03-13 ENCOUNTER — Other Ambulatory Visit: Payer: Self-pay

## 2023-03-13 ENCOUNTER — Telehealth: Payer: Self-pay | Admitting: Family Medicine

## 2023-03-13 DIAGNOSIS — M25552 Pain in left hip: Secondary | ICD-10-CM

## 2023-03-13 DIAGNOSIS — M25562 Pain in left knee: Secondary | ICD-10-CM

## 2023-03-13 MED ORDER — APIXABAN 5 MG PO TABS: 5.0000 mg | ORAL_TABLET | Freq: Two times a day (BID) | ORAL | 1 refills | Status: AC

## 2023-03-13 NOTE — Telephone Encounter (Signed)
Inetta Fermo from [facility] called stating that the patient has been having continued pain and would like to proceed with the CT scan. Can this be ordered for her?  (Can you add for the imaging center to call Val Riles at [phone] so that she can set up the appointment and transportation?)

## 2023-03-13 NOTE — Telephone Encounter (Signed)
Called patient and informed her Eliquis dose needs to be increased to 5mg  twice daily per Pharmacy and dosing criteria.  She is in agreement.  She is currently in [facility] after a fall.  New Rx sent to [facility]. And called and spoke with Nursing Supervisor at [facility] and gave order to increase Eliquis while pt is there as well.

## 2023-03-13 NOTE — Telephone Encounter (Signed)
CT ordered. Scheduling note specified to call [facility] about scheduling. Pt left VM informing her.

## 2023-03-13 NOTE — Telephone Encounter (Signed)
Left message to call office

## 2023-03-14 ENCOUNTER — Other Ambulatory Visit: Payer: Self-pay | Admitting: *Deleted

## 2023-03-14 NOTE — Patient Outreach (Signed)
Late entry for 03/13/23. Post- Acute Care Manager follow up. Mrs. Lisa Mcclure resides in [facility]. Screening for potential pharmacy needs as benefit of health plan and PCP.  Telephonic meeting with Deseree, SNF social worker. [name] continues to work with therapy. Transition plans are pending.   Raiford Noble, MSN, RN, BSN [place]  [facility], Healthy Communities RN Post- Acute Care Manager Direct Dial: [phone]

## 2023-03-16 ENCOUNTER — Telehealth: Payer: Self-pay | Admitting: Cardiology

## 2023-03-16 NOTE — Telephone Encounter (Signed)
Pt c/o medication issue:  1. Name of Medication:  apixaban (ELIQUIS) 5 MG TABS tablet  2. How are you currently taking this medication (dosage and times per day)?   3. Are you having a reaction (difficulty breathing--STAT)?   4. What is your medication issue?    Inetta Fermo with [facility] needs an order stating that Eliquis was increased from2.5 MG to 5 MG. She would also like clarification on a heart valve replacement.  Phone#: [phone] Fax#: [phone]

## 2023-03-16 NOTE — Telephone Encounter (Signed)
Eliquis changed in 12/2 refill  phone note.  I spoke with Inetta Fermo and gave her this information.  Will fax refill note to her.   Patient had appointment for TAVR consult but had to cancel due to fall.  Per 11/14 phone note patient will call when she is ready to proceed with referral.  Inetta Fermo will give this information to patient and have her call office when ready to proceed

## 2023-03-18 NOTE — Progress Notes (Signed)
Left knee xray shows partial knee replacement without failure or fracture.

## 2023-03-20 DIAGNOSIS — G4700 Insomnia, unspecified: Secondary | ICD-10-CM | POA: Diagnosis not present

## 2023-03-20 DIAGNOSIS — F331 Major depressive disorder, recurrent, moderate: Secondary | ICD-10-CM | POA: Diagnosis not present

## 2023-03-20 DIAGNOSIS — F411 Generalized anxiety disorder: Secondary | ICD-10-CM | POA: Diagnosis not present

## 2023-03-22 ENCOUNTER — Other Ambulatory Visit (INDEPENDENT_AMBULATORY_CARE_PROVIDER_SITE_OTHER): Payer: Medicare Other

## 2023-03-22 ENCOUNTER — Ambulatory Visit: Payer: Medicare Other | Admitting: Orthopaedic Surgery

## 2023-03-22 ENCOUNTER — Encounter: Payer: Self-pay | Admitting: Orthopaedic Surgery

## 2023-03-22 DIAGNOSIS — Z8781 Personal history of (healed) traumatic fracture: Secondary | ICD-10-CM

## 2023-03-22 DIAGNOSIS — Z9889 Other specified postprocedural states: Secondary | ICD-10-CM | POA: Diagnosis not present

## 2023-03-22 NOTE — Progress Notes (Signed)
The patient is about 7 weeks status post fixation of a complex left hip intertrochanteric/subtrochanteric fracture.  She is 89 years old.  She is staying at a skilled nursing facility as she recovers.  Unfortunately she does have debilitating arthritis in her left knee with a partial knee replacement and end-stage arthritis in both of her shoulders which is made recovery difficult from a mobility standpoint.  She said the hip is feeling okay.  I can easily put her left hip through internal extra rotation with some discomfort that is appropriate.  X-rays of her left hip show that the fracture is healing appropriately and the hardware is intact.  The hip joint is well-maintained.  From my standpoint she will continue weightbearing as tolerated.  I would like to see her back in 6 weeks with an AP and lateral of her left femur.  We do not need to see the pelvis.

## 2023-03-24 ENCOUNTER — Other Ambulatory Visit: Payer: Medicare Other

## 2023-03-28 NOTE — Telephone Encounter (Signed)
Left message to call office

## 2023-03-31 ENCOUNTER — Ambulatory Visit
Admission: RE | Admit: 2023-03-31 | Discharge: 2023-03-31 | Disposition: A | Discharge status: Home / Self Care | Payer: Medicare Other | Source: Ambulatory Visit | Attending: Family Medicine | Admitting: Family Medicine

## 2023-03-31 DIAGNOSIS — M25562 Pain in left knee: Secondary | ICD-10-CM

## 2023-03-31 DIAGNOSIS — M1712 Unilateral primary osteoarthritis, left knee: Secondary | ICD-10-CM | POA: Diagnosis not present

## 2023-03-31 DIAGNOSIS — Z96652 Presence of left artificial knee joint: Secondary | ICD-10-CM | POA: Diagnosis not present

## 2023-03-31 DIAGNOSIS — M25462 Effusion, left knee: Secondary | ICD-10-CM | POA: Diagnosis not present

## 2023-04-03 DIAGNOSIS — F411 Generalized anxiety disorder: Secondary | ICD-10-CM | POA: Diagnosis not present

## 2023-04-03 DIAGNOSIS — G4700 Insomnia, unspecified: Secondary | ICD-10-CM | POA: Diagnosis not present

## 2023-04-03 DIAGNOSIS — F331 Major depressive disorder, recurrent, moderate: Secondary | ICD-10-CM | POA: Diagnosis not present

## 2023-04-13 ENCOUNTER — Telehealth: Payer: Self-pay | Admitting: Family Medicine

## 2023-04-13 DIAGNOSIS — I714 Abdominal aortic aneurysm, without rupture, unspecified: Secondary | ICD-10-CM | POA: Diagnosis not present

## 2023-04-13 DIAGNOSIS — N1832 Chronic kidney disease, stage 3b: Secondary | ICD-10-CM | POA: Diagnosis not present

## 2023-04-13 DIAGNOSIS — E039 Hypothyroidism, unspecified: Secondary | ICD-10-CM | POA: Diagnosis not present

## 2023-04-13 DIAGNOSIS — K219 Gastro-esophageal reflux disease without esophagitis: Secondary | ICD-10-CM

## 2023-04-13 DIAGNOSIS — Z4789 Encounter for other orthopedic aftercare: Secondary | ICD-10-CM

## 2023-04-13 DIAGNOSIS — I5089 Other heart failure: Secondary | ICD-10-CM | POA: Diagnosis not present

## 2023-04-13 DIAGNOSIS — H409 Unspecified glaucoma: Secondary | ICD-10-CM

## 2023-04-13 NOTE — Telephone Encounter (Signed)
 Scheduled 04/17/2023.

## 2023-04-13 NOTE — Telephone Encounter (Signed)
 I called Lisa Mcclure and reviewed the CT scan results with her again.  Schedule with me next week for potential injection.  If that does not work we will do a genicular artery embolization.

## 2023-04-13 NOTE — Telephone Encounter (Signed)
 Pt called about CT results for knee. Read review that Dr. Joane documented this morning.  Pt in pain, first available appt is Monday afternoon. Would like to come tomorrow but also unsure if we would be doing an injection or what we can do to help her pain. She wants to leave [facility] but cannot at this point.

## 2023-04-17 ENCOUNTER — Other Ambulatory Visit: Payer: Self-pay

## 2023-04-17 ENCOUNTER — Ambulatory Visit (INDEPENDENT_AMBULATORY_CARE_PROVIDER_SITE_OTHER): Payer: Medicare Other | Admitting: Family Medicine

## 2023-04-17 VITALS — BP 126/82 | HR 68 | Ht 63.0 in

## 2023-04-17 DIAGNOSIS — G4700 Insomnia, unspecified: Secondary | ICD-10-CM | POA: Diagnosis not present

## 2023-04-17 DIAGNOSIS — G8929 Other chronic pain: Secondary | ICD-10-CM | POA: Diagnosis not present

## 2023-04-17 DIAGNOSIS — F411 Generalized anxiety disorder: Secondary | ICD-10-CM | POA: Diagnosis not present

## 2023-04-17 DIAGNOSIS — M25562 Pain in left knee: Secondary | ICD-10-CM

## 2023-04-17 DIAGNOSIS — F331 Major depressive disorder, recurrent, moderate: Secondary | ICD-10-CM | POA: Diagnosis not present

## 2023-04-17 MED ORDER — PREDNISONE 5 MG (48) PO TBPK: ORAL_TABLET | ORAL | 0 refills | Status: DC

## 2023-04-17 NOTE — Patient Instructions (Addendum)
 Thank you for coming in today.   You received an injection today. Seek immediate medical attention if the joint becomes red, extremely painful, or is oozing fluid. (Triamcinolone  and Marcaine )  I've referred you to Interventional Radiology for a Genicular Artery Embolization.  Let us  know if you don't hear from them in one week.

## 2023-04-17 NOTE — Progress Notes (Signed)
 [name], PhD, LAT, ATC acting as a scribe for Artist Lloyd, MD.  Lisa Mcclure is a 89 y.o. female who presents to [facility] today for f/u L knee pain w/ CT review. Pt was last seen by Dr. Lloyd on 03/06/23 and a CT was ordered.   Today, pt reports she is still at the rehab facility. L knee is very painful. She notes her PCP previously prescribed a low dose of a steroid to help treat her overall arthritis.   Dx imaging: 03/31/23 L knee CT  03/06/23 L knee XR  Pertinent review of systems: No fevers or chills  Relevant historical information: Left partial knee replacement   Exam:  BP 126/82   Pulse 68   Ht 5' 3 (1.6 m)   SpO2 98%   BMI 37.88 kg/m  General: Well Developed, well nourished, and in no acute distress.   MSK: Left knee mature scar anterior knee.  Tender palpation medial knee.  Decreased range of motion.    Lab and Radiology Results  Procedure: Real-time Ultrasound Guided Injection of left knee anterior lateral approach Device: Philips Affiniti 50G/GE Logiq Images permanently stored and available for review in PACS Verbal informed consent obtained.  Discussed risks and benefits of procedure. Warned about infection, bleeding, hyperglycemia damage to structures among others. Patient expresses understanding and agreement Time-out conducted.   Noted no overlying erythema, induration, or other signs of local infection.   Skin prepped in a sterile fashion.   Local anesthesia: Topical Ethyl chloride.   With sterile technique and under real time ultrasound guidance: 9 g of Kenalog  and 2 mL of Marcaine  injected into knee joint. Fluid seen entering the capsule.   Completed without difficulty   Pain moderately resolved suggesting accurate placement of the medication.   Advised to call if fevers/chills, erythema, induration, drainage, or persistent bleeding.   Images permanently stored and available for review in the ultrasound  unit.  Impression: Technically successful ultrasound guided injection.  EXAM: CT OF THE LEFT KNEE WITHOUT CONTRAST   TECHNIQUE: Multidetector CT imaging of the left knee was performed according to the standard protocol. Multiplanar CT image reconstructions were also generated.   RADIATION DOSE REDUCTION: This exam was performed according to the departmental dose-optimization program which includes automated exposure control, adjustment of the mA and/or kV according to patient size and/or use of iterative reconstruction technique.   COMPARISON:  Left knee radiographs 03/06/2023 and 04/08/2015   FINDINGS: Bones/Joint/Cartilage   Postsurgical changes of long femoral intramedullary nail placement, partially visualized. More remote postsurgical changes of medial unicompartmental arthroplasty. Unchanged mild scalloping of the medial tibial plateau deep to the hardware. There is metallic streak artifact from the hardware. Within this limitation, no acute fracture line is seen.   Moderate patellofemoral joint space narrowing and peripheral osteophytosis.   No acute fracture is seen.   Ligaments   Suboptimally assessed by CT.   Muscles and Tendons   There is mild-to-moderate feathery fatty infiltration of the semimembranosus, biceps femoris, and vastus lateralis muscles. Mild fatty infiltration of the vastus medialis muscle. No tendon tear is seen.   Soft tissues   Mild joint effusion with mild synovial thickening. There is mild concavity from a longitudinal surgical scar within the posterolateral knee subcutaneous fat. Moderate to high-grade atherosclerotic calcifications.   IMPRESSION: 1. No acute fracture is seen. 2. Postsurgical changes of medial unicompartmental arthroplasty. 3. Moderate patellofemoral osteoarthritis. 4. Mild joint effusion with mild synovial thickening/synovitis.  Electronically Signed   By: Tanda Lyons M.D.   On: 04/11/2023 13:04   I,  Artist Lloyd, personally (independently) visualized and performed the interpretation of the images attached in this note.       Assessment and Plan: 89 y.o. female with severe continued left knee pain after a fall.  No fracture seen on initial x-ray and follow-up CT scan.  She is receiving therapy as part of her stay in a skilled nursing facility but still not improving.  She is having quite a bit of pain.  Intra-articular steroid injection given today.  We talked about risk of doing the shot in the setting of a partial knee replacement.  Also I have prescribed some prednisone  for her to take if her pain persist beyond a few days.  Additionally refer to interventional radiology to discuss and evaluate for genicular artery embolization.   PDMP not reviewed this encounter. Orders Placed This Encounter  Procedures   US  LIMITED JOINT SPACE STRUCTURES LOW LEFT(NO LINKED CHARGES)    Reason for Exam (SYMPTOM  OR DIAGNOSIS REQUIRED):   left knee pain    Preferred imaging location?:   [facility] Radiologist Eval And Mgmt    Standing Status:   Future    Expiration Date:   04/16/2024    Scheduling Instructions:     Genicular Artery Embolization consultation, L knee    Reason for Exam (SYMPTOM  OR DIAGNOSIS REQUIRED):   Left knee pain    Preferred imaging location?:   [facility]   Meds ordered this encounter  Medications   predniSONE  (STERAPRED UNI-PAK 48 TAB) 5 MG (48) TBPK tablet    Sig: 12 day dosepack po    Dispense:  48 tablet    Refill:  0     Discussed warning signs or symptoms. Please see discharge instructions. Patient expresses understanding.   The above documentation has been reviewed and is accurate and complete Artist Lloyd, M.D.

## 2023-04-19 DIAGNOSIS — Z4789 Encounter for other orthopedic aftercare: Secondary | ICD-10-CM

## 2023-04-19 DIAGNOSIS — N1832 Chronic kidney disease, stage 3b: Secondary | ICD-10-CM | POA: Diagnosis not present

## 2023-04-19 DIAGNOSIS — E039 Hypothyroidism, unspecified: Secondary | ICD-10-CM

## 2023-04-19 DIAGNOSIS — I714 Abdominal aortic aneurysm, without rupture, unspecified: Secondary | ICD-10-CM | POA: Diagnosis not present

## 2023-04-19 DIAGNOSIS — I5089 Other heart failure: Secondary | ICD-10-CM | POA: Diagnosis not present

## 2023-04-19 DIAGNOSIS — H409 Unspecified glaucoma: Secondary | ICD-10-CM

## 2023-04-19 DIAGNOSIS — K219 Gastro-esophageal reflux disease without esophagitis: Secondary | ICD-10-CM | POA: Diagnosis not present

## 2023-04-20 ENCOUNTER — Telehealth: Payer: Self-pay | Admitting: Cardiology

## 2023-04-20 NOTE — Telephone Encounter (Signed)
 [name] would like to let Dr. Jacinto Halim know that patient will be discharged from [facility] on 1/21 and she would like to discuss further recommendations regarding aortic valve. Please advise.

## 2023-04-21 NOTE — Telephone Encounter (Signed)
 I am good with that thank you

## 2023-04-21 NOTE — Telephone Encounter (Signed)
 Pt scheduled for 05/12/23 w/Ganji to review health since [facility]. She is interested in scheduling again the cath and proceeding with TAVR workup.

## 2023-04-24 ENCOUNTER — Telehealth: Payer: Self-pay

## 2023-04-24 DIAGNOSIS — H353221 Exudative age-related macular degeneration, left eye, with active choroidal neovascularization: Secondary | ICD-10-CM | POA: Diagnosis not present

## 2023-04-24 DIAGNOSIS — S72002A Fracture of unspecified part of neck of left femur, initial encounter for closed fracture: Secondary | ICD-10-CM

## 2023-04-24 DIAGNOSIS — H353123 Nonexudative age-related macular degeneration, left eye, advanced atrophic without subfoveal involvement: Secondary | ICD-10-CM | POA: Diagnosis not present

## 2023-04-24 DIAGNOSIS — H353114 Nonexudative age-related macular degeneration, right eye, advanced atrophic with subfoveal involvement: Secondary | ICD-10-CM | POA: Diagnosis not present

## 2023-04-24 DIAGNOSIS — H35033 Hypertensive retinopathy, bilateral: Secondary | ICD-10-CM | POA: Diagnosis not present

## 2023-04-24 DIAGNOSIS — H43813 Vitreous degeneration, bilateral: Secondary | ICD-10-CM | POA: Diagnosis not present

## 2023-04-24 NOTE — Progress Notes (Signed)
 Received a call from Lisa Mcclure, she is needing transportation from rehab and to doctors follow up visits.   Unfortunately, this patient is not VBCI attributed and therefore, we are unable to assist with this patient's needs.

## 2023-05-01 DIAGNOSIS — I5089 Other heart failure: Secondary | ICD-10-CM | POA: Diagnosis not present

## 2023-05-01 DIAGNOSIS — Z4789 Encounter for other orthopedic aftercare: Secondary | ICD-10-CM

## 2023-05-01 DIAGNOSIS — H409 Unspecified glaucoma: Secondary | ICD-10-CM

## 2023-05-01 DIAGNOSIS — I714 Abdominal aortic aneurysm, without rupture, unspecified: Secondary | ICD-10-CM | POA: Diagnosis not present

## 2023-05-01 DIAGNOSIS — K219 Gastro-esophageal reflux disease without esophagitis: Secondary | ICD-10-CM | POA: Diagnosis not present

## 2023-05-01 DIAGNOSIS — E039 Hypothyroidism, unspecified: Secondary | ICD-10-CM

## 2023-05-01 DIAGNOSIS — N1832 Chronic kidney disease, stage 3b: Secondary | ICD-10-CM | POA: Diagnosis not present

## 2023-05-02 ENCOUNTER — Ambulatory Visit: Payer: Medicare Other | Admitting: Cardiology

## 2023-05-04 ENCOUNTER — Ambulatory Visit: Payer: Medicare Other | Admitting: Orthopaedic Surgery

## 2023-05-12 ENCOUNTER — Telehealth: Payer: Self-pay

## 2023-05-12 ENCOUNTER — Other Ambulatory Visit (HOSPITAL_COMMUNITY): Payer: Self-pay

## 2023-05-12 ENCOUNTER — Ambulatory Visit: Attending: Cardiology | Admitting: Cardiology

## 2023-05-12 ENCOUNTER — Encounter: Payer: Self-pay | Admitting: Cardiology

## 2023-05-12 VITALS — BP 102/60 | HR 74 | Resp 16 | Ht 63.0 in | Wt 182.2 lb

## 2023-05-12 DIAGNOSIS — I480 Paroxysmal atrial fibrillation: Secondary | ICD-10-CM | POA: Diagnosis not present

## 2023-05-12 DIAGNOSIS — D6869 Other thrombophilia: Secondary | ICD-10-CM

## 2023-05-12 DIAGNOSIS — I350 Nonrheumatic aortic (valve) stenosis: Secondary | ICD-10-CM

## 2023-05-12 DIAGNOSIS — I5032 Chronic diastolic (congestive) heart failure: Secondary | ICD-10-CM | POA: Diagnosis not present

## 2023-05-12 NOTE — Telephone Encounter (Signed)
Pharmacy Patient Advocate Encounter   Received notification from Physician's Office that test claims for Entresto,Farxiga and Eliquis is required/requested.   Insurance verification completed.   The patient is insured through [facility].  (30 DS) Patient has a deductible.  Entresto $127.19 (tried the 24-26 mg, says refill too soon but the 97-103 mg has the same copay as the 49-51 mg)   Farxiga $108.18  Eliquis $109.37

## 2023-05-12 NOTE — Progress Notes (Signed)
Cardiology Office Note:  .   Date:  05/12/2023  ID:  [name], DOB 09-18-1932, MRN [id number] PCP: Garlan Fillers, MD  [facility] Providers Cardiologist:  Yates Decamp, MD   History of Present Illness: .   KATTY FRETWELL is a 89 y.o. Patient had a mechanical fall resulting in left hip fracture and underwent ORIF on 02/03/2023, was discharged to rehab.  In the interim patient was previously planned for left heart catheterization for consideration for TAVR in view of severe AS.  She now presents for follow-up.  Past medical history significant for  obesity, chronic diastolic CHF, hypertensive heart disease and chronic 3A-B kidney disease, paroxysmal atrial fibrillation, anemia secondary to hemorrhoids and diverticulosis, esophageal reflux, depression, OSA intolerant to CPAP on nocturnal 02, rheumatoid arthritis, and venous insufficiency, aortic and coronary calcification, s/p AAA repair on 12/20/2019. Patient underwent pseudoaneurysm sac repair of the AAA on 10/24/2022 and repair of the endoleak. Dry weight 195 Lbs   Discussed the use of AI scribe software for clinical note transcription with the patient, who gave verbal consent to proceed.  History of Present Illness   The patient is an 89 year old female with severe aortic stenosis who presents for follow-up regarding her heart condition and recent fall.  She has severe aortic stenosis and was previously hospitalized for heart failure. A TAVR procedure was planned but postponed due to her recent fall and other health issues. She is currently not in heart failure, and her weight has decreased to 182 pounds from a previous dry weight of 195 pounds. She experiences shortness of breath, particularly when using a walker or walking. No drowsiness or sleepiness is reported despite medication changes.  She reports ongoing issues with her left knee following a fall. The knee buckled while she was on parallel bars, leading to an  inability to bear weight on it. Her medication regimen includes Eliquis, Farxiga, Entresto, spironolactone, and furosemide. Her Eliquis dose was increased from 2.5 mg to 5 mg. She also takes nortriptyline, which was increased from 10 mg to 25 mg, and ropinirole, which was increased from 1 mg to 2 mg at bedtime.  She is considering moving to assisted living due to the high cost of 24/7 home care and the risk of falls. She is currently living independently with full-time care but is overwhelmed by the decision-making process.      Labs   Lab Results  Component Value Date   NA 138 02/03/2023   K 3.9 02/03/2023   CO2 26 02/03/2023   GLUCOSE 98 02/03/2023   BUN 25 (H) 02/03/2023   CREATININE 0.89 02/03/2023   CALCIUM 8.8 (L) 02/03/2023   GFR 71.14 08/09/2010   EGFR 71 01/25/2023   GFRNONAA >60 02/03/2023      Latest Ref Rng & Units 02/03/2023    1:02 AM 02/02/2023    9:42 PM 01/25/2023    4:20 PM  BMP  Glucose 70 - 99 mg/dL 98  657  97   BUN 8 - 23 mg/dL 25  27  29    Creatinine 0.44 - 1.00 mg/dL 8.46  9.62  9.52   BUN/Creat Ratio 12 - 28   36   Sodium 135 - 145 mmol/L 138  139  142   Potassium 3.5 - 5.1 mmol/L 3.9  4.4  4.8   Chloride 98 - 111 mmol/L 100  101  104   CO2 22 - 32 mmol/L 26  26  25    Calcium  8.9 - 10.3 mg/dL 8.8  9.0  8.9       Latest Ref Rng & Units 02/06/2023    8:13 AM 02/05/2023    7:54 AM 02/04/2023    3:23 AM  CBC  WBC 4.0 - 10.5 K/uL 9.8  11.6  14.2   Hemoglobin 12.0 - 15.0 g/dL 62.9  52.8  41.3   Hematocrit 36.0 - 46.0 % 34.4  33.6  36.8   Platelets 150 - 400 K/uL 221  200  238     Review of Systems  Cardiovascular:  Positive for dyspnea on exertion. Negative for chest pain and leg swelling.    Physical Exam:   VS:  BP 102/60 (BP Location: Left Arm, Patient Position: Sitting, Cuff Size: Normal)   Pulse 74   Resp 16   Ht 5\' 3"  (1.6 m)   Wt 182 lb 3.2 oz (82.6 kg)   SpO2 96%   BMI 32.28 kg/m    Wt Readings from Last 3 Encounters:  05/12/23  182 lb 3.2 oz (82.6 kg)  02/05/23 213 lb 13.5 oz (97 kg)  01/25/23 204 lb (92.5 kg)    Physical Exam Neck:     Vascular: Carotid bruit (bilateral) present. No JVD.  Cardiovascular:     Rate and Rhythm: Normal rate and regular rhythm.     Pulses: Intact distal pulses.     Heart sounds: S1 normal and S2 normal. Murmur heard.     Harsh mid to late systolic murmur is present with a grade of 3/6 at the upper right sternal border radiating to the neck.     No gallop.  Pulmonary:     Effort: Pulmonary effort is normal.     Breath sounds: Normal breath sounds.  Abdominal:     General: Bowel sounds are normal.     Palpations: Abdomen is soft.  Musculoskeletal:     Right lower leg: No edema.     Left lower leg: No edema.     Studies Reviewed: .    Echocardiogram 01/20/2023: 1. Left ventricular ejection fraction, by estimation, is 60 to 65%. The left ventricle has normal function. The left ventricle has no regional wall motion abnormalities. There is moderate concentric left ventricular hypertrophy. Left ventricular diastolic parameters are indeterminate.  2. Right ventricular systolic function is normal. The right ventricular size is normal.  3. Left atrial size was moderately dilated.  4. The mitral valve is degenerative. Mild mitral valve regurgitation. No evidence of mitral stenosis.  5. The aortic valve is tricuspid. There is severe calcifcation of the aortic valve. Aortic valve regurgitation is trivial. Moderate to severe aortic valve stenosis. Aortic valve area, by VTI measures 0.95 cm. Aortic valve mean gradient measures 19.5 mmHg. Aortic valve Vmax measures 2.96 m/s.  6. The inferior vena cava is normal in size with greater than 50% respiratory variability, suggesting right atrial pressure of 3 mmHg  EKG:    EKG Interpretation Date/Time:  Friday May 12 2023 14:25:56 EST Ventricular Rate:  74 PR Interval:  208 QRS Duration:  84 QT Interval:  382 QTC Calculation: 424 R  Axis:   -1  Text Interpretation: EKG 05/12/2023: Normal sinus rhythm at rate of 74 bpm, normal axis.  Poor R wave progression, cannot exclude anterior infarct old.  Compared to 02/02/2023, frequent PVCs (3) no longer present. Confirmed by Delrae Rend [id number]) on 05/12/2023 2:35:42 PM    Medications and allergies    Allergies  Allergen Reactions   Statins Other (See  Comments)    Muscle aches and INTERNAL BLEEDING   Atorvastatin Other (See Comments)    Myalgias   Gabapentin Other (See Comments)    "Made me loopy"   Methocarbamol Other (See Comments)    "Made me loopy"     Current Outpatient Medications:    ALEVE ARTHRITIS PAIN 1 % GEL, Apply 2 g topically 4 (four) times daily as needed (for pain)., Disp: , Rfl:    apixaban (ELIQUIS) 5 MG TABS tablet, Take 1 tablet (5 mg total) by mouth 2 (two) times daily., Disp: 180 tablet, Rfl: 1   budesonide (PULMICORT) 0.5 MG/2ML nebulizer solution, Take 2 mLs (0.5 mg total) by nebulization in the morning and at bedtime. (Patient taking differently: Take 0.5 mg by nebulization daily.), Disp: 120 mL, Rfl: 1   busPIRone (BUSPAR) 7.5 MG tablet, Take 7.5 mg by mouth See admin instructions. Take 7.5 mg by mouth at bedtime and an additional 7.5 mg once a day as needed for anxiety, Disp: , Rfl:    Cholecalciferol (VITAMIN D3) 125 MCG (5000 UT) CAPS, Take 1 capsule by mouth daily., Disp: , Rfl:    dapagliflozin propanediol (FARXIGA) 10 MG TABS tablet, Take 1 tablet (10 mg total) by mouth daily before breakfast., Disp: 30 tablet, Rfl: 6   diclofenac sodium (VOLTAREN) 1 % GEL, Apply 2.25 g topically 3 (three) times daily as needed (for pain)., Disp: , Rfl:    furosemide (LASIX) 20 MG tablet, Take 1 tablet (20 mg total) by mouth daily., Disp: 90 tablet, Rfl: 0   latanoprost (XALATAN) 0.005 % ophthalmic solution, Place 1 drop into both eyes at bedtime., Disp: , Rfl:    levothyroxine (SYNTHROID) 125 MCG tablet, Take 125 mcg by mouth daily before breakfast. ,  Disp: , Rfl:    melatonin 3 MG TABS tablet, Take 3 mg by mouth at bedtime., Disp: , Rfl:    Multiple Vitamins-Minerals (PRESERVISION AREDS 2 PO), Take 1 capsule by mouth in the morning and at bedtime., Disp: , Rfl:    nortriptyline (PAMELOR) 10 MG capsule, Take 10 mg by mouth at bedtime., Disp: , Rfl:    ondansetron (ZOFRAN) 4 MG tablet, Take 4 mg by mouth every 8 (eight) hours as needed for nausea or vomiting., Disp: , Rfl:    oxyCODONE (OXY IR/ROXICODONE) 5 MG immediate release tablet, Take 1 tablet (5 mg total) by mouth every 4 (four) hours as needed for moderate pain (pain score 4-6) (pain score 4-6)., Disp: 30 tablet, Rfl: 0   polyethylene glycol (MIRALAX / GLYCOLAX) 17 g packet, Take 17 g by mouth 2 (two) times daily. Decrease to daily dosing if developing diarrhea/watery stools, Disp: , Rfl:    rOPINIRole (REQUIP) 1 MG tablet, Take 1-2 mg by mouth See admin instructions. Take 2mg  by mouth at 1 PM and 1mg  at 9 PM, Disp: , Rfl: 12   sacubitril-valsartan (ENTRESTO) 24-26 MG, Take 1 tablet by mouth 2 (two) times daily., Disp: 60 tablet, Rfl: 6   spironolactone (ALDACTONE) 25 MG tablet, Take 25 mg by mouth daily., Disp: , Rfl:    SYSTANE COMPLETE PF 0.6 % SOLN, Place 1 drop into both eyes 4 (four) times daily as needed (for dryness)., Disp: , Rfl:    ASSESSMENT AND PLAN: .      ICD-10-CM   1. Severe aortic stenosis  I35.0 EKG 12-Lead    2. Chronic diastolic (congestive) heart failure (HCC)  I50.32     3. Paroxysmal atrial fibrillation (HCC)  I48.0  4. Secondary hypercoagulable state (HCC)  D68.69      Click Here to Calculate/Change CHADS2VASc Score The patient's CHADS2-VASc score is 6, indicating a 9.7% annual risk of stroke.  Therefore, anticoagulation is recommended.   CHF History: Yes HTN History: Yes Diabetes History: No Stroke History: No Vascular Disease History: Yes   Assessment and Plan    Severe Aortic Stenosis Severe aortic stenosis, not critical. Experiencing  shortness of breath. Previously scheduled for TAVR; decision to wait and watch due to recent fall, surgery, and current health status. Not in heart failure, weight loss noted. Discussed anesthesia risks and potential complications given age and recent health issues. Emphasized quality of life over quantity. - Wait and watch approach for TAVR - Reassess in three months  Chronic Diastolic Heart Failure Chronic diastolic heart failure managed with medications. Current regimen includes Farxiga, furosemide, sacubitril/valsartan (Entresto), and spironolactone. Kidney function remains stable. - Continue Farxiga 10 mg daily - Continue furosemide 10 mg daily - Continue sacubitril/valsartan (Entresto) - Continue spironolactone 25 mg once a day  Atrial Fibrillation Atrial fibrillation managed with apixaban (Eliquis) 5 mg to prevent stroke. - Continue apixaban (Eliquis) 5 mg  Post-Fall Rehabilitation Post-fall knee pain and weakness. Received injection and oral steroids. Knee improving but still weak. Using a walker, shortness of breath with exertion. - Encourage continued use of walker - Consider Tylenol before walking for pain management - Reassess in three months  General Health Maintenance Advised to maintain or lose weight to avoid exacerbating shortness of breath and other health issues. Discussed importance of staying active despite pain to prevent further decline in mobility. - Maintain or reduce current weight - Encourage physical activity within pain tolerance  Goals of Care Discussed importance of quality of life over quantity. Considering moving to assisted living to reduce family burden and ensure 24/7 care. Emphasized making decisions while mentally capable to avoid future confusion. - Consider moving to assisted living - Discuss with family and make a decision about living situation  Follow-up - Follow-up appointment in three months.     This is a 51-minute office visit encounter  in review of her medications, reconciliation of external records and coordination of care and making complex decisions regarding procedural issue and social issues to be.   Signed,  Yates Decamp, MD, [facility] 05/12/2023, 5:04 PM [facility] [address] [place], [place] [address] Phone: [phone]. Fax:  [phone]

## 2023-05-12 NOTE — Patient Instructions (Addendum)
Medication Instructions:  Your physician recommends that you continue on your current medications as directed. Please refer to the Current Medication list given to you today.  *If you need a refill on your cardiac medications before your next appointment, please call your pharmacy*   Lab Work: none If you have labs (blood work) drawn today and your tests are completely normal, you will receive your results only by: MyChart Message (if you have MyChart) OR A paper copy in the mail If you have any lab test that is abnormal or we need to change your treatment, we will call you to review the results.   Testing/Procedures: none   Follow-Up: At [facility], you and your health needs are our priority.  As part of our continuing mission to provide you with exceptional heart care, we have created designated Provider Care Teams.  These Care Teams include your primary Cardiologist (physician) and Advanced Practice Providers (APPs -  Physician Assistants and Nurse Practitioners) who all work together to provide you with the care you need, when you need it.  We recommend signing up for the patient portal called "MyChart".  Sign up information is provided on this After Visit Summary.  MyChart is used to connect with patients for Virtual Visits (Telemedicine).  Patients are able to view lab/test results, encounter notes, upcoming appointments, etc.  Non-urgent messages can be sent to your provider as well.   To learn more about what you can do with MyChart, go to [email or web address].    Your next appointment:   May 1 at 1:40  Provider:   Yates Decamp, MD     Other Instructions

## 2023-05-15 NOTE — Telephone Encounter (Signed)
Called and left a voicemail for patient's daughter Dois Davenport to call back.  Patient has [place] and therefore should be able to get these medications from meds by mail for free.  Left voicemail for daughter to call back to discuss

## 2023-05-24 ENCOUNTER — Ambulatory Visit: Payer: Medicare Other | Admitting: Orthopaedic Surgery

## 2023-05-25 ENCOUNTER — Telehealth: Payer: Self-pay | Admitting: Cardiology

## 2023-05-25 NOTE — Telephone Encounter (Signed)
Yes please. Just until weight comes back down. Make sure you stress regarding diet

## 2023-05-25 NOTE — Telephone Encounter (Signed)
Pt c/o swelling: STAT is pt has developed SOB within 24 hours  How much weight have you gained and in what time span? Unsure  If swelling, where is the swelling located? Below the knee  Are you currently taking a fluid pill? Yes, Lasix 20 MG   Are you currently SOB? No  Do you have a log of your daily weights (if so, list)? No, patient broke her hip and has not been able to weight herself  Have you gained 3 pounds in a day or 5 pounds in a week? Unsure  Have you traveled recently? No RN Beth from [facility] is requesting to know if the patient can take a high dosage of Lasix for the swelling.

## 2023-05-25 NOTE — Telephone Encounter (Signed)
Spoke with Beth [facility] RN) and explained that Dr. Jacinto Halim recommended increasing Lasix to 40 mg BID until the pt's weight lowers and the swelling decreases. Pt's HH RN verbalized understanding and stated they would continue with that plan.

## 2023-05-25 NOTE — Telephone Encounter (Signed)
She has severe AS and hence CHF,  she was also in the rehab and lost weight and now she is back at home, not sure if her diet has been god. Increase Lasxi to 40 mg BID (am and 3 Pm). She was still thinking about TAVR or we should start thinking palliative care

## 2023-05-29 NOTE — Telephone Encounter (Signed)
I spoke with patient's daughter. Explained that her meds would be 100% covered though mail by meds. Daughter wants to try to submit to Pt assistance programs. She is trying to avoid using the mail by meds. Advised I will submit but I must include that pt has [employer].

## 2023-06-06 NOTE — Telephone Encounter (Signed)
 Applications were faxed 06/04/22

## 2023-06-12 ENCOUNTER — Ambulatory Visit: Payer: Medicare Other | Admitting: Orthopaedic Surgery

## 2023-06-29 ENCOUNTER — Other Ambulatory Visit (INDEPENDENT_AMBULATORY_CARE_PROVIDER_SITE_OTHER): Payer: Self-pay

## 2023-06-29 ENCOUNTER — Encounter: Payer: Self-pay | Admitting: Orthopaedic Surgery

## 2023-06-29 ENCOUNTER — Ambulatory Visit: Payer: Medicare Other | Admitting: Orthopaedic Surgery

## 2023-06-29 DIAGNOSIS — S72142D Displaced intertrochanteric fracture of left femur, subsequent encounter for closed fracture with routine healing: Secondary | ICD-10-CM | POA: Diagnosis not present

## 2023-06-29 DIAGNOSIS — Z9889 Other specified postprocedural states: Secondary | ICD-10-CM

## 2023-06-29 DIAGNOSIS — Z8781 Personal history of (healed) traumatic fracture: Secondary | ICD-10-CM

## 2023-06-29 NOTE — Progress Notes (Signed)
 The patient is a pleasant 89 year old female who is 5 months out from a left proximal femur fracture.  This required open reduction/intra fixation with a [employer] InterTAN nail with screws.  She has had some problems with mobility.  She has severe debilitating arthritis in both shoulders and that affects her ability use walker.  She does have a history of a partial left knee replacement done probably over 20 years ago.  She has significant arthritis in that left knee but is not interested in any type of revision surgery.  The hip itself is doing well but she is definitely frustrated by her lack of mobility.  She does have a history of a right knee replacement as well.  Her left hip moves smoothly and fluidly no blocks or rotation no significant pain at all.  X-rays of the left femur show the fracture approximately has healed completely.  There is no evidence of hardware failure.  From my standpoint follow-up for her hip can be as needed.  Hopefully she can improve her mobility as time goes by.  Her mobility is certainly hampered by her other musculoskeletal issues.  All questions and concerns were answered and addressed.  Follow-up again as as needed.

## 2023-07-24 ENCOUNTER — Telehealth: Payer: Self-pay | Admitting: Pharmacist

## 2023-07-24 NOTE — Telephone Encounter (Signed)
 Appears patient approved for [place] [employer] needs tax return and new Rx [facility] needs update on form of place of care and also OOP expense report Pt daughter is aware. She will drop off tomorrow hopefully.

## 2023-07-27 ENCOUNTER — Other Ambulatory Visit (HOSPITAL_COMMUNITY): Payer: Self-pay

## 2023-07-27 ENCOUNTER — Telehealth: Payer: Self-pay | Admitting: Pharmacy Technician

## 2023-07-27 NOTE — Telephone Encounter (Signed)
 Sent tax return to [employer] per request from other encounter and Briant Camper will send in the new rx    Patel, Vaishali K, [facility]     07/27/23  2:36 PM Note Daughter dropped in necessary documents. We will forward them to med assistance team.      Maccia, Melissa D, RPH-CPP     07/24/23  3:59 PM Note Appears patient approved for AZ&ME [employer] needs tax return and new Rx [facility] needs update on form of place of care and also OOP expense report Pt daughter is aware. She will drop off tomorrow hopefully.

## 2023-07-27 NOTE — Telephone Encounter (Signed)
 Daughter dropped in necessary documents. We will forward them to med assistance team.

## 2023-07-27 NOTE — Telephone Encounter (Signed)
 I faxed the oop expense report to bms as requested per other encounter and then I called [facility]ms to see what the "update on form place of care" meant and they said they have the updated form of place of care. They actually said they have everything they need as of yesterday. They said they are waiting for approval/denial. They said they are waiting on her insurance eligibility as well.       Cathalene Clipper, [facility]     07/27/23  2:36 PM Note Daughter dropped in necessary documents. We will forward them to med assistance team.      Maccia, Melissa D, RPH-CPP     07/24/23  3:59 PM Note Appears patient approved for AZ&ME [employer] needs tax return and new Rx [facility] needs update on form of place of care and also OOP expense report Pt daughter is aware. She will drop off tomorrow hopefully.       I ran a test claim and it went through [employer]. [facility] was asking if she has insurance.

## 2023-08-01 ENCOUNTER — Other Ambulatory Visit (HOSPITAL_COMMUNITY): Payer: Self-pay

## 2023-08-01 NOTE — Telephone Encounter (Signed)
 I called and they said last year Dr. Berry Bristol name was another first name Olegario Berlin? I told them his name is Marijean Shouts in our system. She is going to let them know that is his name and she said we should hear 3-5 business days if she is approved.

## 2023-08-01 NOTE — Telephone Encounter (Signed)
 PAP: Patient assistance application for Eliquis  has been approved by PAP Companies: [employer] from 07/31/23 to 04/10/24. Medication should be delivered to PAP Delivery: Home. For further shipping updates, please contact [employer] ([employer]) at [phone]. Patient ID is: [id number]      I called the patient and lmom  I called the daughter and made her aware of the approval

## 2023-08-01 NOTE — Telephone Encounter (Signed)
 Hi, I called to check on this application with [employer] and they said the provider form that was sent in had the wrong provider first name on it... do you guys happen to have a copy of that provider form? If not, I can send a blank one to the office and get them to get the provider to sign it again and I can fill it out. They wouldn't fax me what they had.Aaron Aas

## 2023-08-02 ENCOUNTER — Telehealth: Payer: Self-pay

## 2023-08-02 DIAGNOSIS — I5032 Chronic diastolic (congestive) heart failure: Secondary | ICD-10-CM

## 2023-08-02 DIAGNOSIS — I10 Essential (primary) hypertension: Secondary | ICD-10-CM

## 2023-08-03 NOTE — Telephone Encounter (Signed)
 I called the daughter to let her know the entresto  was approved by assistance. I had to leave a message

## 2023-08-03 NOTE — Telephone Encounter (Signed)
 Patient approved for assitance for Entresto 

## 2023-08-04 ENCOUNTER — Telehealth: Payer: Self-pay

## 2023-08-04 NOTE — Progress Notes (Signed)
   Telephone encounter was:  Successful.  Complex Care Management Note Care Guide Note  08/04/2023 Name: [name] MRN: [id number] DOB: 04/19/1932  Lisa Mcclure is a 89 y.o. year old female who is a primary care patient of Efraim Grange Cris Dollar, MD . The community resource team was consulted for assistance with Transportation Needs   SDOH screenings and interventions completed:  Yes  Social Drivers of Health From This Encounter   Transportation Needs: Unmet Transportation Needs (08/04/2023)   PRAPARE - Transportation    Lack of Transportation (Medical): Yes    Lack of Transportation (Non-Medical): No    SDOH Interventions Today    Flowsheet Row Most Recent Value  SDOH Interventions   Transportation Interventions Community Resources Provided, [id number] Referral        Care guide performed the following interventions: Patient provided with information about care guide support team and interviewed to confirm resource needs.Patient is requesting Transportation Resources to be mailed to her.   Follow Up Plan:  No further follow up planned at this time. The patient has been provided with needed resources.  Encounter Outcome:  Patient Visit Completed    Azell Leopard [facility]  [facility] Guide, Phone: [phone] Fax: [phone] Website: [email or web address]

## 2023-08-09 ENCOUNTER — Telehealth: Payer: Self-pay | Admitting: Pharmacy Technician

## 2023-08-09 NOTE — Telephone Encounter (Signed)
 PAP: Patient assistance application for Farxiga  has been approved by PAP Companies: [place] from 08/08/23 to 04/10/24. Medication should be delivered to PAP Delivery: Home. For further shipping updates, please contact [employer] (AZ[employer]Me) at [phone]. Patient ID is: [id number]

## 2023-08-10 ENCOUNTER — Ambulatory Visit: Attending: Cardiology | Admitting: Cardiology

## 2023-08-10 ENCOUNTER — Encounter: Payer: Self-pay | Admitting: Cardiology

## 2023-08-10 VITALS — BP 126/79 | HR 72 | Ht 63.5 in | Wt 182.0 lb

## 2023-08-10 DIAGNOSIS — I5032 Chronic diastolic (congestive) heart failure: Secondary | ICD-10-CM | POA: Insufficient documentation

## 2023-08-10 DIAGNOSIS — I350 Nonrheumatic aortic (valve) stenosis: Secondary | ICD-10-CM | POA: Diagnosis present

## 2023-08-10 DIAGNOSIS — Z66 Do not resuscitate: Secondary | ICD-10-CM | POA: Insufficient documentation

## 2023-08-10 DIAGNOSIS — I480 Paroxysmal atrial fibrillation: Secondary | ICD-10-CM | POA: Insufficient documentation

## 2023-08-10 NOTE — Patient Instructions (Signed)
 Medication Instructions:  Your physician recommends that you continue on your current medications as directed. Please refer to the Current Medication list given to you today.  *If you need a refill on your cardiac medications before your next appointment, please call your pharmacy*  Lab Work: none If you have labs (blood work) drawn today and your tests are completely normal, you will receive your results only by: MyChart Message (if you have MyChart) OR A paper copy in the mail If you have any lab test that is abnormal or we need to change your treatment, we will call you to review the results.  Testing/Procedures: none  Follow-Up: At [facility], you and your health needs are our priority.  As part of our continuing mission to provide you with exceptional heart care, our providers are all part of one team.  This team includes your primary Cardiologist (physician) and Advanced Practice Providers or APPs (Physician Assistants and Nurse Practitioners) who all work together to provide you with the care you need, when you need it.  Your next appointment:   6 month(s)  Provider:   Knox Perl, MD    We recommend signing up for the patient portal called "MyChart".  Sign up information is provided on this After Visit Summary.  MyChart is used to connect with patients for Virtual Visits (Telemedicine).  Patients are able to view lab/test results, encounter notes, upcoming appointments, etc.  Non-urgent messages can be sent to your provider as well.   To learn more about what you can do with MyChart, go to [email or web address].   Other Instructions

## 2023-08-10 NOTE — Progress Notes (Signed)
 Cardiology Office Note:  .   Date:  08/10/2023  ID:  Lisa Mcclure, DOB 02/20/1933, MRN [id number] PCP: Bertha Broad, MD  [facility] Providers Cardiologist:  Knox Perl, MD   History of Present Illness: .   Lisa Mcclure is a 89 y.o. female patient with obesity, chronic diastolic CHF, hypertensive heart disease and chronic 3A-B kidney disease, paroxysmal atrial fibrillation, anemia secondary to hemorrhoids and diverticulosis, esophageal reflux, depression, OSA intolerant to CPAP on nocturnal 02, rheumatoid arthritis, and venous insufficiency, aortic and coronary calcification, s/p AAA repair on 12/20/2019. Patient underwent pseudoaneurysm sac repair of the AAA on 10/24/2022 and repair of the endoleak. Dry weight 195 Lbs   While we were debating regarding TAVR, patient had an accidental fall and had ORIF on 02/03/2023 on the left hip.  I had seen her on 05/12/2023.  This is a 30-month office visit.  She has lost additional 10 pounds in weight, has been extremely strict with her diet but states that she does not like this.  She is able to walk with help of walker and overall denies symptoms of dyspnea or leg edema, PND or orthopnea.  Discussed the use of AI scribe software for clinical note transcription with the patient, who gave verbal consent to proceed.  History of Present Illness Lisa Mcclure is an 89 year old female with chronic diastolic heart failure who presents for follow-up. She is accompanied by [name], her helper.  She has lost approximately ten pounds, attributed to dietary changes. Her breathing is stable, and she has not experienced significant leg swelling recently. Mobility is limited; she uses a walker and cannot walk long distances. Her medication regimen includes Farxiga  10 mg daily, Entresto  24/26 mg daily, and spironolactone  25 mg daily. For atrial fibrillation, she takes Eliquis  5 mg twice daily, with no recent episodes. A do-not-resuscitate order is in  place, worn as a bracelet during hospitalizations.  Labs   Lab Results  Component Value Date   NA 138 02/03/2023   K 3.9 02/03/2023   CO2 26 02/03/2023   GLUCOSE 98 02/03/2023   BUN 25 (H) 02/03/2023   CREATININE 0.89 02/03/2023   CALCIUM 8.8 (L) 02/03/2023   GFR 71.14 08/09/2010   EGFR 71 01/25/2023   GFRNONAA >60 02/03/2023      Latest Ref Rng & Units 02/03/2023    1:02 AM 02/02/2023    9:42 PM 01/25/2023    4:20 PM  BMP  Glucose 70 - 99 mg/dL 98  782  97   BUN 8 - 23 mg/dL 25  27  29    Creatinine 0.44 - 1.00 mg/dL 9.56  2.13  0.86   BUN/Creat Ratio 12 - 28   36   Sodium 135 - 145 mmol/L 138  139  142   Potassium 3.5 - 5.1 mmol/L 3.9  4.4  4.8   Chloride 98 - 111 mmol/L 100  101  104   CO2 22 - 32 mmol/L 26  26  25    Calcium 8.9 - 10.3 mg/dL 8.8  9.0  8.9       Latest Ref Rng & Units 02/06/2023    8:13 AM 02/05/2023    7:54 AM 02/04/2023    3:23 AM  CBC  WBC 4.0 - 10.5 K/uL 9.8  11.6  14.2   Hemoglobin 12.0 - 15.0 g/dL 57.8  46.9  62.9   Hematocrit 36.0 - 46.0 % 34.4  33.6  36.8   Platelets 150 - 400  K/uL 221  200  238    No results found for: "HGBA1C"  Lab Results  Component Value Date   TSH 1.195 06/19/2022    ROS  Review of Systems  Cardiovascular:  Positive for dyspnea on exertion (stable). Negative for chest pain and leg swelling.    Physical Exam:   VS:  BP 126/79 (BP Location: Right Arm, Patient Position: Sitting, Cuff Size: Normal)   Pulse 72   Ht 5' 3.5" (1.613 m)   Wt 182 lb (82.6 kg)   BMI 31.73 kg/m    Wt Readings from Last 3 Encounters:  08/10/23 182 lb (82.6 kg)  05/12/23 182 lb 3.2 oz (82.6 kg)  02/05/23 213 lb 13.5 oz (97 kg)    Physical Exam Constitutional:      Appearance: She is obese.  Neck:     Vascular: Carotid bruit (bilateral conducted AS sounds) present. No JVD.  Cardiovascular:     Rate and Rhythm: Normal rate and regular rhythm.     Pulses: Intact distal pulses.     Heart sounds: S1 normal and S2 normal. Murmur  heard.     Mid to late systolic murmur is present with a grade of 3/6 at the upper right sternal border and apex.     No gallop.  Pulmonary:     Effort: Pulmonary effort is normal.     Breath sounds: Normal breath sounds.  Abdominal:     General: Bowel sounds are normal.     Palpations: Abdomen is soft.  Musculoskeletal:     Right lower leg: No edema.     Left lower leg: No edema.  Skin:    General: Skin is warm.    Studies Reviewed: Aaron Aas    ECHOCARDIOGRAM COMPLETE 01/20/2023  1. Left ventricular ejection fraction, by estimation, is 60 to 65%. The left ventricle has normal function. The left ventricle has no regional wall motion abnormalities. There is moderate concentric left ventricular hypertrophy. Left ventricular diastolic parameters are indeterminate. 2. Right ventricular systolic function is normal. The right ventricular size is normal. 3. Left atrial size was moderately dilated. 4. The mitral valve is degenerative. Mild mitral valve regurgitation. No evidence of mitral stenosis. 5. The aortic valve is tricuspid. There is severe calcifcation of the aortic valve. Aortic valve regurgitation is trivial. Moderate to severe aortic valve stenosis. Aortic valve area, by VTI measures 0.95 cm. Aortic valve mean gradient measures 19.5 mmHg. Aortic valve Vmax measures 2.96 m/s.  EKG:         Medications and allergies    Allergies  Allergen Reactions   Statins Other (See Comments)    Muscle aches and INTERNAL BLEEDING   Atorvastatin Other (See Comments)    Myalgias   Gabapentin Other (See Comments)    "Made me loopy"   Methocarbamol  Other (See Comments)    "Made me loopy"     Current Outpatient Medications:    ALEVE ARTHRITIS PAIN 1 % GEL, Apply 2 g topically 4 (four) times daily as needed (for pain)., Disp: , Rfl:    apixaban  (ELIQUIS ) 5 MG TABS tablet, Take 1 tablet (5 mg total) by mouth 2 (two) times daily., Disp: 180 tablet, Rfl: 1   budesonide  (PULMICORT ) 0.5 MG/2ML  nebulizer solution, Take 2 mLs (0.5 mg total) by nebulization in the morning and at bedtime. (Patient taking differently: Take 0.5 mg by nebulization daily.), Disp: 120 mL, Rfl: 1   busPIRone  (BUSPAR ) 7.5 MG tablet, Take 7.5 mg by mouth See admin instructions.  Take 7.5 mg by mouth at bedtime and an additional 7.5 mg once a day as needed for anxiety, Disp: , Rfl:    Cholecalciferol  (VITAMIN D3) 125 MCG (5000 UT) CAPS, Take 1 capsule by mouth daily., Disp: , Rfl:    dapagliflozin  propanediol (FARXIGA ) 10 MG TABS tablet, Take 1 tablet (10 mg total) by mouth daily before breakfast., Disp: 30 tablet, Rfl: 6   diclofenac  sodium (VOLTAREN ) 1 % GEL, Apply 2.25 g topically 3 (three) times daily as needed (for pain)., Disp: , Rfl:    furosemide  (LASIX ) 20 MG tablet, Take 1 tablet (20 mg total) by mouth daily., Disp: 90 tablet, Rfl: 0   latanoprost  (XALATAN ) 0.005 % ophthalmic solution, Place 1 drop into both eyes at bedtime., Disp: , Rfl:    levothyroxine  (SYNTHROID ) 125 MCG tablet, Take 125 mcg by mouth daily before breakfast. , Disp: , Rfl:    melatonin 3 MG TABS tablet, Take 3 mg by mouth at bedtime., Disp: , Rfl:    methocarbamol  (ROBAXIN ) 500 MG tablet, Take 500 mg by mouth every 6 (six) hours as needed., Disp: , Rfl:    Multiple Vitamins-Minerals (PRESERVISION AREDS 2 PO), Take 1 capsule by mouth in the morning and at bedtime., Disp: , Rfl:    nortriptyline  (PAMELOR ) 10 MG capsule, Take 10 mg by mouth at bedtime., Disp: , Rfl:    oxyCODONE  (OXY IR/ROXICODONE ) 5 MG immediate release tablet, Take 1 tablet (5 mg total) by mouth every 4 (four) hours as needed for moderate pain (pain score 4-6) (pain score 4-6)., Disp: 30 tablet, Rfl: 0   polyethylene glycol (MIRALAX  / GLYCOLAX ) 17 g packet, Take 17 g by mouth 2 (two) times daily. Decrease to daily dosing if developing diarrhea/watery stools, Disp: , Rfl:    rOPINIRole  (REQUIP ) 1 MG tablet, Take 1-2 mg by mouth See admin instructions. Take 2mg  by mouth at 1 PM  and 1mg  at 9 PM, Disp: , Rfl: 12   sacubitril -valsartan  (ENTRESTO ) 24-26 MG, Take 1 tablet by mouth 2 (two) times daily., Disp: 60 tablet, Rfl: 6   spironolactone  (ALDACTONE ) 25 MG tablet, Take 25 mg by mouth daily., Disp: , Rfl:    SYSTANE COMPLETE PF 0.6 % SOLN, Place 1 drop into both eyes 4 (four) times daily as needed (for dryness)., Disp: , Rfl:    No orders of the defined types were placed in this encounter.    Medications Discontinued During This Encounter  Medication Reason   ondansetron  (ZOFRAN ) 4 MG tablet Patient Preference     ASSESSMENT AND PLAN: .      ICD-10-CM   1. Severe aortic stenosis  I35.0     2. Chronic diastolic (congestive) heart failure (HCC)  I50.32     3. Paroxysmal atrial fibrillation (HCC)  I48.0     4. DNR (do not resuscitate)  Z66       Assessment and Plan Assessment & Plan Chronic diastolic heart failure   Chronic diastolic heart failure symptoms have improved with weight loss and dietary changes. Previously, she experienced significant leg swelling and dyspnea, which are now better managed. Her condition is sensitive to dietary sodium intake. Current medications, including Farxiga , Entresto , and spironolactone , are effectively managing her heart failure. Medication dosages have been adjusted based on improved renal function. Continue Farxiga  10 mg, Entresto  24/26 mg, and spironolactone  25 mg once daily. Monitor dietary sodium intake to prevent exacerbations.  Aortic stenosis   Aortic stenosis symptoms, such as dyspnea and leg swelling, have improved with  weight loss and dietary changes. Previously considered for TAVR, the procedure is on hold due to symptom improvement and patient preference. Monitor symptoms and reassess if the condition worsens.  Atrial fibrillation   Atrial fibrillation is well-managed with no recent episodes. She is on Eliquis  for anticoagulation to prevent thromboembolic events. Continue Eliquis  5 mg twice daily.  DNR status    She has a DNR order in place, indicating her preference not to undergo resuscitation in the event of cardiac arrest. This decision is documented and respected by the care team. Maintain DNR status as per her wishes.   Signed,  Knox Perl, MD, [facility] 08/10/2023, 2:23 PM [facility] [address] [place], [place] [address] Phone: [phone]. Fax:  [phone]

## 2023-08-31 ENCOUNTER — Ambulatory Visit (INDEPENDENT_AMBULATORY_CARE_PROVIDER_SITE_OTHER): Admitting: Family Medicine

## 2023-08-31 ENCOUNTER — Encounter: Payer: Self-pay | Admitting: Family Medicine

## 2023-08-31 ENCOUNTER — Other Ambulatory Visit: Payer: Self-pay

## 2023-08-31 VITALS — BP 126/84 | HR 76 | Ht 63.5 in

## 2023-08-31 DIAGNOSIS — Z13820 Encounter for screening for osteoporosis: Secondary | ICD-10-CM

## 2023-08-31 DIAGNOSIS — M25512 Pain in left shoulder: Secondary | ICD-10-CM | POA: Diagnosis not present

## 2023-08-31 DIAGNOSIS — G8929 Other chronic pain: Secondary | ICD-10-CM

## 2023-08-31 DIAGNOSIS — M25511 Pain in right shoulder: Secondary | ICD-10-CM

## 2023-08-31 DIAGNOSIS — Z87310 Personal history of (healed) osteoporosis fracture: Secondary | ICD-10-CM

## 2023-08-31 NOTE — Progress Notes (Signed)
 I, Miquel Amen, CMA acting as a scribe for Garlan Juniper, MD.  Lisa Mcclure is a 89 y.o. female who presents to [facility] today for cont'd bilat shoulder pain. Pt's last visit for her shoulders was on 12/16/22 and she was given bilat GH steroid injections.   Today, pt reports pain and limited ROM in bilat shoulders. Unable to take NSAIDs. Has tried Heat and ice as well as Tylenol . Pt is RHD. Notes that L shoulder pain is slightly worse than right. Having radicular sx at night with side-lying. Notes weakness and decreased grip strength. Wonders what medication options are available, unable to take NSAIDs.   Dx imaging: 06/14/22 R & L shoulder XR 07/10/19 R & L shoulder XR   Pertinent review of systems: No fevers or chills  Relevant historical information: Aortic stenosis.  Heart failure.  Shoulder arthritis.  History of a hip fracture recently.   Exam:  BP 126/84   Pulse 76   Ht 5' 3.5" (1.613 m)   BMI 31.73 kg/m  General: Well Developed, well nourished, and in no acute distress.   MSK: Shoulders bilaterally normal.  Decreased motion.    Lab and Radiology Results  Procedure: Real-time Ultrasound Guided Injection of left shoulder glenohumeral joint posterior approach Device: Philips Affiniti 50G/GE Logiq Images permanently stored and available for review in PACS Verbal informed consent obtained.  Discussed risks and benefits of procedure. Warned about infection, bleeding, hyperglycemia damage to structures among others. Patient expresses understanding and agreement Time-out conducted.   Noted no overlying erythema, induration, or other signs of local infection.   Skin prepped in a sterile fashion.   Local anesthesia: Topical Ethyl chloride.   With sterile technique and under real time ultrasound guidance: 40 mg of Kenalog  and 2 mL of Marcaine  injected into glenohumeral joint. Fluid seen entering the joint capsule.   Completed without  difficulty   Pain immediately resolved suggesting accurate placement of the medication.   Advised to call if fevers/chills, erythema, induration, drainage, or persistent bleeding.   Images permanently stored and available for review in the ultrasound unit.  Impression: Technically successful ultrasound guided injection.    Procedure: Real-time Ultrasound Guided Injection of right shoulder glenohumeral joint posterior approach Device: Philips Affiniti 50G/GE Logiq Images permanently stored and available for review in PACS Verbal informed consent obtained.  Discussed risks and benefits of procedure. Warned about infection, bleeding, hyperglycemia damage to structures among others. Patient expresses understanding and agreement Time-out conducted.   Noted no overlying erythema, induration, or other signs of local infection.   Skin prepped in a sterile fashion.   Local anesthesia: Topical Ethyl chloride.   With sterile technique and under real time ultrasound guidance: 40 mg of Kenalog  and 2 mL of Marcaine  injected into shoulder joint. Fluid seen entering the joint capsule.   Completed without difficulty   Pain immediately resolved suggesting accurate placement of the medication.   Advised to call if fevers/chills, erythema, induration, drainage, or persistent bleeding.   Images permanently stored and available for review in the ultrasound unit.  Impression: Technically successful ultrasound guided injection.       Assessment and Plan: 89 y.o. female with bilateral shoulder pain due to severe posterior arthritis.  She is not a good candidate for shoulder replacement so we have been doing intermittent steroid injections.  Can do this every 3 months if needed.  Additionally patient has had several fractures and almost certainly has pretty bad osteoporosis.  She  is willing to take medications for it if we do find osteoporosis on a bone density test.  I think it is a reasonable thing to add.   Happy to have a conversation about bone density test and osteoporosis management via video chat once we get the results back.  She notes that she has some transportation difficulties which may get into the office hard or expensive.   PDMP reviewed during this encounter. Orders Placed This Encounter  Procedures   US  LIMITED JOINT SPACE STRUCTURES UP BILAT(NO LINKED CHARGES)    Reason for Exam (SYMPTOM  OR DIAGNOSIS REQUIRED):   bilat shoulder pain    Preferred imaging location?:   [facility] Bone Density    Standing Status:   Future    Expiration Date:   08/30/2024    Reason for Exam (SYMPTOM  OR DIAGNOSIS REQUIRED):   evaluate bone density    Preferred imaging location?:   [address]   No orders of the defined types were placed in this encounter.    Discussed warning signs or symptoms. Please see discharge instructions. Patient expresses understanding.   The above documentation has been reviewed and is accurate and complete Garlan Juniper, M.D.

## 2023-08-31 NOTE — Patient Instructions (Addendum)
 Thank you for coming in today.   You received an injection today. Seek immediate medical attention if the joint becomes red, extremely painful, or is oozing fluid.   We've place an order for a Bone Density Scan.   See you back in 3 months. OK to scheduled with me (Dr. Alease Hunter) or Dr. Cleora Daft.

## 2023-09-22 ENCOUNTER — Telehealth: Payer: Self-pay | Admitting: Cardiology

## 2023-09-22 NOTE — Telephone Encounter (Signed)
 Pt c/o swelling/edema: STAT if pt has developed SOB within 24 hours  If swelling, where is the swelling located?   Both legs and abdomen  How much weight have you gained and in what time span?   Patient not able to get on scale  Have you gained 2 pounds in a day or 5 pounds in a week?   Unknown  Do you have a log of your daily weights (if so, list)?   No  Are you currently taking a fluid pill?  Yes  Are you currently SOB?  No  Have you traveled recently in a car or plane for an extended period of time?  No  Caller Tyra Galley) stated patient has a blister on her right shin which is intact.  Caller noted patient legs looks tight and abdomen looks a little puffy.  Caller stated patient told her she doubled up on her Lasix  last week.

## 2023-09-22 NOTE — Telephone Encounter (Signed)
 Please increase Lasix  from 20 mg daily to 40 mg twice daily for 10 days then 40 mg daily and not to wait until this becomes a major issue and to call us  sooner.

## 2023-09-22 NOTE — Telephone Encounter (Signed)
 [facility] nurse went to see her today- her legs are very tight and swollen. Reports pt said it is better than it was.   She doubled up on lasix  for a few days and it seemed to help some with the swelling. She feels that the pt's belly is a little swollen and firm.   Her lungs are clear. O2 sat is 99%. She is doing her best to elevate. She is not short of breath. Just reports feeling a little tired.   [facility] nurse wanted to know if maybe she needs some changes to her lasix . Get her in to be seen by a provider here soon? She is unable to get the patient on the scale at this time.   Informed her that I will send this information to the provider to see if he would like to make any changes.

## 2023-09-25 ENCOUNTER — Telehealth: Payer: Self-pay | Admitting: Cardiology

## 2023-09-25 DIAGNOSIS — I5032 Chronic diastolic (congestive) heart failure: Secondary | ICD-10-CM

## 2023-09-25 MED ORDER — FUROSEMIDE 20 MG PO TABS: 20.0000 mg | ORAL_TABLET | Freq: Every day | ORAL | 3 refills | Status: DC

## 2023-09-25 NOTE — Telephone Encounter (Signed)
 No return phone # available for [facility] from [place]. Call to dtr [facility]) to advise per Dr. Berry Bristol to increase Lasix  from 20 mg daily to 40 mg twice daily for 10 days then 40 mg daily and not to wait until this becomes a major issue and to call us  sooner. Dtr verbalizes understanding.

## 2023-09-25 NOTE — Telephone Encounter (Signed)
*  STAT* If patient is at the pharmacy, call can be transferred to refill team.   1. Which medications need to be refilled? (please list name of each medication and dose if known)   furosemide  (LASIX ) 20 MG tablet   2. Would you like to learn more about the convenience, safety, & potential cost savings by using the [facility]?   3. Are you open to using the [facility] (Type [facility]. ).  4. Which pharmacy/location (including street and city if local pharmacy) is medication to be sent to?  [facility] - [place], [place] - [address]   5. Do they need a 30 day or 90 day supply?   90 day  Patient stated her medication was increased and she will need a new prescription sent to her pharmacy.

## 2023-09-25 NOTE — Telephone Encounter (Signed)
 RX sent to requested Pharmacy

## 2023-09-26 NOTE — Telephone Encounter (Signed)
 Spoke with Tyra Galley and she is aware per provider patient can increase lasix  to 40 mg daily x3 days. Then resume back to 20 mg daily. She verbalized understanding

## 2023-09-26 NOTE — Telephone Encounter (Signed)
 Denise with [facility] called in asking for an update. She would like to confirm the medicaton change.   C/b number: [phone]

## 2023-09-26 NOTE — Telephone Encounter (Signed)
 Spoke with Tyra Galley from [place] and she states she feel 40 mg of lasix  BID for 10 days is excessive.She states the blister is gone and patient swelling has went down some in her legs.She would like an order for lasix  40 mg daily x3 then resume back to 20 mg daily.

## 2023-09-26 NOTE — Telephone Encounter (Signed)
I am good with that

## 2023-10-05 ENCOUNTER — Telehealth: Payer: Self-pay | Admitting: Pharmacy Technician

## 2023-10-05 NOTE — Telephone Encounter (Signed)
   For her farxiga

## 2023-11-08 ENCOUNTER — Telehealth: Payer: Self-pay | Admitting: Cardiology

## 2023-11-08 NOTE — Telephone Encounter (Signed)
 Please send the Rx 20 mg po daily 90 with 1 refill

## 2023-11-08 NOTE — Telephone Encounter (Signed)
 Pt c/o medication issue:  1. Name of Medication: furosemide  (LASIX ) 20 MG tablet   2. How are you currently taking this medication (dosage and times per day)? As written   3. Are you having a reaction (difficulty breathing--STAT)? No  4. What is your medication issue? [facility] home health nurse requesting a updated script be faxed to [phone]

## 2023-11-15 ENCOUNTER — Telehealth (HOSPITAL_COMMUNITY): Payer: Self-pay

## 2023-11-15 NOTE — Telephone Encounter (Signed)
 Auth Submission: NO AUTH NEEDED Site of care: Site of care: [facility] Payer: Medicare A/B, [employer] Medication & CPT/J Code(s) submitted: Prolia  (Denosumab ) [id number] Diagnosis Code: M81.0 Route of submission (phone, fax, portal):  Phone # Fax # Auth type: Buy/Bill HB Units/visits requested: 60mg  x 2 doses, q 6 months Reference number:  Approval from: 11/15/23 to 05/11/24

## 2023-11-22 ENCOUNTER — Emergency Department (HOSPITAL_COMMUNITY)
Admission: EM | Admit: 2023-11-22 | Discharge: 2023-11-23 | Disposition: A | Discharge status: Home / Self Care | Attending: Emergency Medicine | Admitting: Emergency Medicine

## 2023-11-22 ENCOUNTER — Emergency Department (HOSPITAL_COMMUNITY)

## 2023-11-22 ENCOUNTER — Encounter (HOSPITAL_COMMUNITY): Payer: Self-pay

## 2023-11-22 ENCOUNTER — Telehealth: Payer: Self-pay | Admitting: Cardiology

## 2023-11-22 ENCOUNTER — Other Ambulatory Visit: Payer: Self-pay

## 2023-11-22 DIAGNOSIS — R531 Weakness: Secondary | ICD-10-CM | POA: Diagnosis not present

## 2023-11-22 DIAGNOSIS — Z7901 Long term (current) use of anticoagulants: Secondary | ICD-10-CM | POA: Insufficient documentation

## 2023-11-22 DIAGNOSIS — I509 Heart failure, unspecified: Secondary | ICD-10-CM | POA: Insufficient documentation

## 2023-11-22 DIAGNOSIS — R0602 Shortness of breath: Secondary | ICD-10-CM | POA: Diagnosis present

## 2023-11-22 LAB — BASIC METABOLIC PANEL WITH GFR
Anion gap: 11 (ref 5–15)
BUN: 53 mg/dL — ABNORMAL HIGH (ref 8–23)
CO2: 22 mmol/L (ref 22–32)
Calcium: 9 mg/dL (ref 8.9–10.3)
Chloride: 105 mmol/L (ref 98–111)
Creatinine, Ser: 1.28 mg/dL — ABNORMAL HIGH (ref 0.44–1.00)
GFR, Estimated: 40 mL/min — ABNORMAL LOW (ref >60)
Glucose, Bld: 104 mg/dL — ABNORMAL HIGH (ref 70–99)
Potassium: 4.9 mmol/L (ref 3.5–5.1)
Sodium: 138 mmol/L (ref 135–145)

## 2023-11-22 LAB — CBC
HCT: 37.3 % (ref 36.0–46.0)
Hemoglobin: 11.4 g/dL — ABNORMAL LOW (ref 12.0–15.0)
MCH: 30.1 pg (ref 26.0–34.0)
MCHC: 30.6 g/dL (ref 30.0–36.0)
MCV: 98.4 fL (ref 80.0–100.0)
Platelets: 295 K/uL (ref 150–400)
RBC: 3.79 MIL/uL — ABNORMAL LOW (ref 3.87–5.11)
RDW: 14.5 % (ref 11.5–15.5)
WBC: 8.6 K/uL (ref 4.0–10.5)
nRBC: 0 % (ref 0.0–0.2)

## 2023-11-22 LAB — RESP PANEL BY RT-PCR (RSV, FLU A&B, COVID)  RVPGX2
Influenza A by PCR: NEGATIVE
Influenza B by PCR: NEGATIVE
Resp Syncytial Virus by PCR: NEGATIVE
SARS Coronavirus 2 by RT PCR: NEGATIVE

## 2023-11-22 LAB — BRAIN NATRIURETIC PEPTIDE: B Natriuretic Peptide: 155.7 pg/mL — ABNORMAL HIGH (ref 0.0–100.0)

## 2023-11-22 LAB — TROPONIN I (HIGH SENSITIVITY)
Troponin I (High Sensitivity): 14 ng/L (ref <18)
Troponin I (High Sensitivity): 15 ng/L (ref <18)

## 2023-11-22 NOTE — Telephone Encounter (Signed)
 Spoke to patient she stated she is sob.Sob worse since yesterday.Hard to walk from room to room.She is also having severe pain and soreness in left lower leg.No swelling Weight stable.Advised she needs to go to ED to be evaluated.I will make Dr.Gangi aware.

## 2023-11-22 NOTE — ED Provider Triage Note (Signed)
 Emergency Medicine Provider Triage Evaluation Note  Lisa Mcclure , a 89 y.o. female  was evaluated in triage.  Pt complains of shortness of breath, fatigue and generally not feeling well since Thursday.   Review of Systems  Positive: Cough, congestion, shortness of breath, fatigue Negative: Chest pain, vomiting, diarrhea  Physical Exam  Ht 5' 3.5 (1.613 m)   Wt 87.5 kg   SpO2 95%   BMI 33.65 kg/m  Gen:   Awake, no distress   Resp:  Normal effort  MSK:   Moves extremities without difficulty  Cardiac: RRR  Medical Decision Making  Medically screening exam initiated at 6:13 PM.  Appropriate orders placed.  Lisa Mcclure was informed that the remainder of the evaluation will be completed by another provider, this initial triage assessment does not replace that evaluation, and the importance of remaining in the ED until their evaluation is complete.     Gennaro Duwaine CROME, DO 11/22/23 [phone]

## 2023-11-22 NOTE — ED Notes (Signed)
 Pt brief changed.

## 2023-11-22 NOTE — Telephone Encounter (Signed)
  Pt c/o Shortness Of Breath: STAT if SOB developed within the last 24 hours or pt is noticeably SOB on the phone  1. Are you currently SOB (can you hear that pt is SOB on the phone)? no  2. How long have you been experiencing SOB? Few days  3. Are you SOB when sitting or when up moving around? Moving around  4. Are you currently experiencing any other symptoms? fatigue

## 2023-11-22 NOTE — ED Triage Notes (Signed)
 Patient bib EMS from home with complaints of shortness of breath and weakness that started yesterday. Patient denies swelling but there is some pain in her LLE. Cardiology wanted her to come in and rule out a blood clot.

## 2023-11-23 ENCOUNTER — Telehealth: Payer: Self-pay | Admitting: Cardiology

## 2023-11-23 ENCOUNTER — Emergency Department (HOSPITAL_COMMUNITY)

## 2023-11-23 DIAGNOSIS — R0602 Shortness of breath: Secondary | ICD-10-CM | POA: Diagnosis not present

## 2023-11-23 MED ORDER — IOHEXOL 350 MG/ML SOLN
75.0000 mL | Freq: Once | INTRAVENOUS | Status: AC | PRN
  Administered 2023-11-23: 75 mL via INTRAVENOUS

## 2023-11-23 NOTE — Telephone Encounter (Signed)
 Left message to call office.

## 2023-11-23 NOTE — Telephone Encounter (Signed)
 Patient seen in ED.

## 2023-11-23 NOTE — Discharge Instructions (Signed)
 You were seen in the emergency department for shortness of breath and weakness.  You had lab work chest x-ray and a CAT scan of your chest that did not show an obvious explanation for your symptoms.  Please continue your regular medications and follow-up with your primary care doctor and cardiologist.  Return if any worsening or concerning symptoms

## 2023-11-23 NOTE — ED Provider Notes (Signed)
 [place] EMERGENCY DEPARTMENT AT [facility] Provider Note   CSN: [id number] Arrival date & time: 11/22/23  1755     Patient presents with: Shortness of Breath and Weakness   Lisa Mcclure is a 89 y.o. female.  She has a history of A-fib on Eliquis , CHF, bronchiolitis aortic stenosis.  She has been more short of breath for the last few days.  Gets winded very easily with any type of exertion.  Cough productive of some white sputum.  No fevers or chills no chest pain.  Has some chronic leg edema.  Had a sharp severe pain in her left lower leg 3 days ago.  Lasted a few hours.  Pain has been on and off since then.  Has some chronic neuropathy in her feet.  She talked to her cardiologist office yesterday who recommended she come here for further evaluation.  She was seen last evening in the ER and had labs drawn and has been waiting since then.  She has been compliant with her medications but has missed her doses since she has been in the department.   The history is provided by the patient.  Shortness of Breath Severity:  Moderate Onset quality:  Gradual Duration:  4 days Timing:  Constant Progression:  Unchanged Chronicity:  Recurrent Relieved by:  Nothing Worsened by:  Activity and coughing Ineffective treatments:  Rest Associated symptoms: cough and sputum production   Associated symptoms: no abdominal pain, no chest pain, no fever, no hemoptysis and no wheezing   Weakness Associated symptoms: cough and shortness of breath   Associated symptoms: no abdominal pain, no chest pain and no fever        Prior to Admission medications   Medication Sig Start Date End Date Taking? Authorizing Provider  ALEVE ARTHRITIS PAIN 1 % GEL Apply 2 g topically 4 (four) times daily as needed (for pain).    [provider]  apixaban  (ELIQUIS ) 5 MG TABS tablet Take 1 tablet (5 mg total) by mouth 2 (two) times daily. 03/13/23   Ladona Heinz, MD  budesonide  (PULMICORT ) 0.5 MG/2ML  nebulizer solution Take 2 mLs (0.5 mg total) by nebulization in the morning and at bedtime. Patient taking differently: Take 0.5 mg by nebulization daily. 03/22/22   Hope Almarie ORN, NP  busPIRone  (BUSPAR ) 7.5 MG tablet Take 7.5 mg by mouth See admin instructions. Take 7.5 mg by mouth at bedtime and an additional 7.5 mg once a day as needed for anxiety    [provider]  Cholecalciferol  (VITAMIN D3) 125 MCG (5000 UT) CAPS Take 1 capsule by mouth daily.    [provider]  dapagliflozin  propanediol (FARXIGA ) 10 MG TABS tablet Take 1 tablet (10 mg total) by mouth daily before breakfast. 02/06/23   Ladona Heinz, MD  diclofenac  sodium (VOLTAREN ) 1 % GEL Apply 2.25 g topically 3 (three) times daily as needed (for pain). 10/23/18   [provider]  furosemide  (LASIX ) 20 MG tablet Take 1 tablet (20 mg total) by mouth daily. 09/25/23   Ladona Heinz, MD  latanoprost  (XALATAN ) 0.005 % ophthalmic solution Place 1 drop into both eyes at bedtime.    [provider]  levothyroxine  (SYNTHROID ) 125 MCG tablet Take 125 mcg by mouth daily before breakfast.     [provider]  melatonin 3 MG TABS tablet Take 3 mg by mouth at bedtime.    [provider]  methocarbamol  (ROBAXIN ) 500 MG tablet Take 500 mg by mouth every 6 (six)  hours as needed. 06/26/23   [provider]  Multiple Vitamins-Minerals (PRESERVISION AREDS 2 PO) Take 1 capsule by mouth in the morning and at bedtime.    [provider]  nortriptyline  (PAMELOR ) 10 MG capsule Take 10 mg by mouth at bedtime. 08/31/22   [provider]  oxyCODONE  (OXY IR/ROXICODONE ) 5 MG immediate release tablet Take 1 tablet (5 mg total) by mouth every 4 (four) hours as needed for moderate pain (pain score 4-6) (pain score 4-6). 02/07/23   Vernetta Lonni GRADE, MD  polyethylene glycol (MIRALAX  / GLYCOLAX ) 17 g packet Take 17 g by mouth 2 (two) times daily. Decrease to daily dosing if developing  diarrhea/watery stools 02/07/23   Briana Elgin LABOR, MD  rOPINIRole  (REQUIP ) 1 MG tablet Take 1-2 mg by mouth See admin instructions. Take 2mg  by mouth at 1 PM and 1mg  at 9 PM 06/19/15   [provider]  sacubitril -valsartan  (ENTRESTO ) 24-26 MG Take 1 tablet by mouth 2 (two) times daily. 02/06/23   Ladona Heinz, MD  spironolactone  (ALDACTONE ) 25 MG tablet Take 25 mg by mouth daily. 11/24/22   [provider]  SYSTANE COMPLETE PF 0.6 % SOLN Place 1 drop into both eyes 4 (four) times daily as needed (for dryness).    [provider]    Allergies: Statins, Atorvastatin, Gabapentin, and Methocarbamol     Review of Systems  Constitutional:  Positive for fatigue. Negative for fever.  Respiratory:  Positive for cough, sputum production and shortness of breath. Negative for hemoptysis and wheezing.   Cardiovascular:  Positive for leg swelling. Negative for chest pain.  Gastrointestinal:  Negative for abdominal pain.  Neurological:  Positive for weakness.    Updated Vital Signs BP (!) 150/69   Pulse 84   Temp (!) 97.4 F (36.3 C) (Oral)   Resp (!) 22   Ht 5' 3.5 (1.613 m)   Wt 87.5 kg   SpO2 96%   BMI 33.65 kg/m   Physical Exam Vitals and nursing note reviewed.  Constitutional:      General: She is not in acute distress.    Appearance: Normal appearance. She is well-developed.  HENT:     Head: Normocephalic and atraumatic.  Eyes:     Conjunctiva/sclera: Conjunctivae normal.  Cardiovascular:     Rate and Rhythm: Normal rate and regular rhythm.     Heart sounds: Murmur heard.  Pulmonary:     Effort: Pulmonary effort is normal. No respiratory distress.     Breath sounds: Normal breath sounds. No stridor. No wheezing.  Abdominal:     Palpations: Abdomen is soft.     Tenderness: There is no abdominal tenderness. There is no guarding or rebound.  Musculoskeletal:     Cervical back: Neck supple.     Right lower leg: Edema present.     Left lower leg: Edema  present.     Comments: She has symmetric nonpitting edema of her lower extremities.  Skin:    General: Skin is warm and dry.  Neurological:     General: No focal deficit present.     Mental Status: She is alert.     GCS: GCS eye subscore is 4. GCS verbal subscore is 5. GCS motor subscore is 6.     (all labs ordered are listed, but only abnormal results are displayed) Labs Reviewed  BASIC METABOLIC PANEL WITH GFR - Abnormal; Notable for the following components:      Result Value   Glucose, Bld 104 (*)  BUN 53 (*)    Creatinine, Ser 1.28 (*)    GFR, Estimated 40 (*)    All other components within normal limits  CBC - Abnormal; Notable for the following components:   RBC 3.79 (*)    Hemoglobin 11.4 (*)    All other components within normal limits  BRAIN NATRIURETIC PEPTIDE - Abnormal; Notable for the following components:   B Natriuretic Peptide 155.7 (*)    All other components within normal limits  RESP PANEL BY RT-PCR (RSV, FLU A&B, COVID)  RVPGX2  TROPONIN I (HIGH SENSITIVITY)  TROPONIN I (HIGH SENSITIVITY)    EKG: EKG Interpretation Date/Time:  Wednesday November 22 2023 18:37:08 EDT Ventricular Rate:  79 PR Interval:  238 QRS Duration:  78 QT Interval:  348 QTC Calculation: 399 R Axis:   0  Text Interpretation: Normal sinus rhythm Nonsepcific ST and T wave abnormalities in the lateral leads Compared with previous EKG from 05/12/2023 Confirmed by Gennaro Bouchard ([id number]) on 11/22/2023 6:41:05 PM  Radiology: CT Angio Chest PE W/Cm &/Or Wo Cm Result Date: 11/23/2023 CLINICAL DATA:  Shortness of breath. EXAM: CT ANGIOGRAPHY CHEST WITH CONTRAST TECHNIQUE: Multidetector CT imaging of the chest was performed using the standard protocol during bolus administration of intravenous contrast. Multiplanar CT image reconstructions and MIPs were obtained to evaluate the vascular anatomy. RADIATION DOSE REDUCTION: This exam was performed according to the departmental dose-optimization  program which includes automated exposure control, adjustment of the mA and/or kV according to patient size and/or use of iterative reconstruction technique. CONTRAST:  75mL OMNIPAQUE  IOHEXOL  350 MG/ML SOLN COMPARISON:  March 17, 2016. FINDINGS: Cardiovascular: Satisfactory opacification of the pulmonary arteries to the segmental level. No evidence of pulmonary embolism. Normal heart size. No pericardial effusion. Coronary artery calcifications are noted. Mediastinum/Nodes: Small sliding-type hiatal hernia is noted. Thyroid  gland is unremarkable. No definite adenopathy is noted. Lungs/Pleura: No pneumothorax or pleural effusion is noted. Minimal bilateral posterior basilar subsegmental atelectasis or scarring is noted. Stable probable scarring seen in right upper lobe. Upper Abdomen: No acute abnormality. Musculoskeletal: No chest wall abnormality. No acute or significant osseous findings. Review of the MIP images confirms the above findings. IMPRESSION: No definite evidence of pulmonary embolus. All sliding-type hiatal hernia. Normal bilateral posterior basilar subsegmental atelectasis or scarring. Coronary artery calcifications are noted. Aortic Atherosclerosis (ICD10-I70.0). Electronically Signed   By: Lynwood Landy Raddle M.D.   On: 11/23/2023 10:29   DG Chest 2 View Result Date: 11/22/2023 CLINICAL DATA:  Shortness of breath EXAM: CHEST - 2 VIEW COMPARISON:  02/06/2023 FINDINGS: Cardiac shadow is stable. Tortuous thoracic aorta is noted with calcification. The lungs are well aerated bilaterally. No focal infiltrate or sizable effusion is seen. Degenerative changes of the thoracic spine are noted. Old rib fractures are seen on the left. Degenerative changes of the left shoulder joint are seen. IMPRESSION: No acute abnormality noted. Electronically Signed   By: Oneil Devonshire M.D.   On: 11/22/2023 21:00     Procedures   Medications Ordered in the ED  iohexol  (OMNIPAQUE ) 350 MG/ML injection 75 mL (75 mLs  Intravenous Contrast Given 11/23/23 0911)    Clinical Course as of 11/23/23 1715  Thu Nov 23, 2023  0930 Troponins flat.  Hemoglobin stable.  BNP is elevated although this is the best number she has had.  Creatinine slightly elevated.  COVID and flu negative. [MB]  0930 Due to her leg pain and shortness of breath with fairly unremarkable workup we will put her in for  a chest CT angio.  Lower yield as sounds like she has been compliant with her anticoagulation. [MB]  1036 Patient CT does not show any acute findings.  Reviewed with her and her daughter.  She is frustrated that nothing was identified but happy that she can go home.  Recommended close follow-up with her PCP. [MB]    Clinical Course User Index [MB] Towana Ozell BROCKS, MD                                 Medical Decision Making Amount and/or Complexity of Data Reviewed Labs: ordered. Radiology: ordered.  Risk Prescription drug management.   This patient complains of shortness of breath and fatigue; this involves an extensive number of treatment Options and is a complaint that carries with it a high risk of complications and morbidity. The differential includes CHF, COPD, pneumonia, COVID, ACS  I ordered, reviewed and interpreted labs, which included CBC with stable low hemoglobin, chemistries with mild elevated creatinine, troponins flat, BNP mildly elevated but better than priors, COVID and flu negative I ordered imaging studies which included chest x-ray and CT angio chest and I independently    visualized and interpreted imaging which showed no acute findings Additional history obtained from patient's daughter Previous records obtained and reviewed in epic including recent cardiology notes Cardiac monitoring reviewed, sinus rhythm Social determinants considered, transportation insecurity physically and active Critical Interventions: None  After the interventions stated above, I reevaluated the patient and found patient  to be satting well on room air in no distress Admission and further testing considered, no indications for admission at this time.  Recommended close follow-up with her treatment team.  Return instructions discussed.      Final diagnoses:  Shortness of breath  Generalized weakness    ED Discharge Orders     None          Towana Ozell BROCKS, MD 11/23/23 [phone]

## 2023-11-23 NOTE — Telephone Encounter (Signed)
 Daughter Lawanda) stated patient was released from the ED and wants a call back from [facility] regarding next steps.

## 2023-11-23 NOTE — ED Notes (Signed)
 Pt taken to triage restroom

## 2023-12-01 ENCOUNTER — Telehealth: Payer: Self-pay | Admitting: Pharmacy Technician

## 2023-12-01 DIAGNOSIS — I5032 Chronic diastolic (congestive) heart failure: Secondary | ICD-10-CM

## 2023-12-01 MED ORDER — DAPAGLIFLOZIN PROPANEDIOL 10 MG PO TABS: 10.0000 mg | ORAL_TABLET | Freq: Every day | ORAL | 3 refills | Status: DC

## 2023-12-01 NOTE — Telephone Encounter (Signed)
 Received [facility] refill request for Farxiga :

## 2023-12-01 NOTE — Telephone Encounter (Addendum)
Prescription sent to [facility]

## 2023-12-01 NOTE — Telephone Encounter (Signed)
 Left message to call back if there were still questions.

## 2023-12-01 NOTE — Addendum Note (Signed)
 Addended by: Paiton Fosco L on: 12/01/2023 05:16 PM   Modules accepted: Orders

## 2023-12-13 NOTE — Progress Notes (Unsigned)
 Cardiology Office Note:  .   Date:  12/14/2023  ID:  Lisa Mcclure, DOB December 09, 1932, MRN [id number] PCP: Lisa Toribio MATSU, MD  [facility] Providers Cardiologist:  Lisa Bergamo, MD   History of Present Illness: .   Lisa Mcclure is a 89 y.o.  female patient with obesity, chronic diastolic CHF, hypertensive heart disease and chronic 3A-B kidney disease, paroxysmal atrial fibrillation, anemia secondary to hemorrhoids and diverticulosis, esophageal reflux, depression, OSA intolerant to CPAP on nocturnal 02, rheumatoid arthritis, and venous insufficiency, aortic and coronary calcification, s/p AAA repair on 12/20/2019. Patient underwent pseudoaneurysm sac repair of the AAA on 10/24/2022 and repair of the endoleak.   Dry weight 195 Lbs   She has severe aortic stenosis, while she was being evaluated for TAVR, had an accidental fall and had ORIF on 02/03/2023 left hip and has been doing well with weight loss and being careful with her diet and has been walking with the help of walker with no symptoms of acute decompensated heart failure and preferred medical therapy for now in view of advanced age as per patient preference.    She was seen in the emergency room on 11/22/2023 for dyspnea, leg pain, neuropathy in her legs and generalized weakness and was concerned about pulmonary embolism, CT chest ruled out pulmonary embolism and there was no evidence of pulm edema or heart failure.  Her BNP was minimally elevated at 155 she was discharged home.  She now presents for 110-month office visit.  She has been extremely careful with her diet.  She has not had any further leg edema, PND or orthopnea.  States that her quality of life has deteriorated in view of macular degeneration, recent hip fracture needing her to be more dependent on people.  Fortunately she has almost around-the-clock help.  Presently on GDP with Farxiga  10 mg, Entresto  24/26 mg twice daily and spironolactone  25 mg daily.  Cardiac  Studies relevent.    ECHOCARDIOGRAM COMPLETE 01/20/2023  1. Left ventricular ejection fraction, by estimation, is 60 to 65%. The left ventricle has normal function. The left ventricle has no regional wall motion abnormalities. There is moderate concentric left ventricular hypertrophy. Left ventricular diastolic parameters are indeterminate. 2. Right ventricular systolic function is normal. The right ventricular size is normal. 3. Left atrial size was moderately dilated. 4. The mitral valve is degenerative. Mild mitral valve regurgitation. No evidence of mitral stenosis. 5. The aortic valve is tricuspid. There is severe calcifcation of the aortic valve. Aortic valve regurgitation is trivial. Moderate to severe aortic valve stenosis. Aortic valve area, by VTI measures 0.95 cm. Aortic valve mean gradient measures 19.5 mmHg. Aortic valve Vmax measures 2.96 m/s.    Discussed the use of AI scribe software for clinical note transcription with the patient, who gave verbal consent to proceed.  History of Present Illness Lisa Mcclure is an 89 year old female with congestive heart failure who presents with concerns about leg pain and shortness of breath.  She experiences severe leg pain, unlike a typical muscle cramp, which persisted for hours and remained sore for two days. She applied ice and other measures to alleviate the pain. Following the leg pain, she developed shortness of breath, raising concerns about a possible blood clot, prompting an emergency room visit on November 22, 2023, where a CT scan and chest x-ray were performed.  She has congestive heart failure and is on Farxiga  10 mg, Lasix  20 mg, spironolactone  25 mg, and Entresto  24/26 mg  daily. For atrial fibrillation, she takes Eliquis  5 mg twice daily. Her weight is stable at 192 pounds, down from a previous dry weight of 195 pounds.  Labs   Lab Results  Component Value Date   CHOL 126 03/14/2019   HDL 49 03/14/2019   LDLCALC 61  03/14/2019   LDLDIRECT 159.2 12/19/2006   TRIG 82 03/14/2019   CHOLHDL 5.1 CALC 12/19/2006   No results found for: LIPOA  Recent Labs    02/02/23 2142 02/03/23 0102 11/22/23 1829  NA 139 138 138  K 4.4 3.9 4.9  CL 101 100 105  CO2 26 26 22   GLUCOSE 102* 98 104*  BUN 27* 25* 53*  CREATININE 0.98 0.89 1.28*  CALCIUM 9.0 8.8* 9.0  GFRNONAA 56* >60 40*    Lab Results  Component Value Date   ALT 12 02/03/2023   AST 19 02/03/2023   ALKPHOS 85 02/03/2023   BILITOT 0.9 02/03/2023      Latest Ref Rng & Units 11/22/2023    6:29 PM 02/06/2023    8:13 AM 02/05/2023    7:54 AM  CBC  WBC 4.0 - 10.5 K/uL 8.6  9.8  11.6   Hemoglobin 12.0 - 15.0 g/dL 88.5  89.5  89.6   Hematocrit 36.0 - 46.0 % 37.3  34.4  33.6   Platelets 150 - 400 K/uL 295  221  200    No results found for: HGBA1C  Lab Results  Component Value Date   TSH 1.195 06/19/2022     BNP (last 3 results) Recent Labs    11/22/23 1829  BNP 155.7*    ProBNP (last 3 results) Recent Labs    01/25/23 1620  PROBNP 1,478*    ROS  Review of Systems  Cardiovascular:  Positive for dyspnea on exertion (chronic and stable). Negative for chest pain and leg swelling.   Physical Exam:   VS:  BP 122/60 (BP Location: Right Arm, Patient Position: Sitting, Cuff Size: Normal)   Pulse (!) 44   Ht 5' 3 (1.6 m)   Wt 192 lb (87.1 kg)   BMI 34.01 kg/m    Wt Readings from Last 3 Encounters:  12/14/23 192 lb (87.1 kg)  11/22/23 193 lb (87.5 kg)  08/10/23 182 lb (82.6 kg)    BP Readings from Last 3 Encounters:  12/14/23 122/60  11/23/23 (!) 125/55  08/31/23 126/84   Physical Exam Constitutional:      Appearance: She is obese.  Neck:     Vascular: Carotid bruit (bilateral conducted AS sounds) present. No JVD.  Cardiovascular:     Rate and Rhythm: Normal rate and regular rhythm.     Pulses: Intact distal pulses.     Heart sounds: S1 normal and S2 normal. Murmur heard.     Mid to late systolic murmur is present  with a grade of 3/6 at the upper right sternal border and apex.     No gallop.  Pulmonary:     Effort: Pulmonary effort is normal.     Breath sounds: Normal breath sounds.  Abdominal:     General: Bowel sounds are normal.     Palpations: Abdomen is soft.  Musculoskeletal:     Right lower leg: No edema.     Left lower leg: No edema.    EKG:         ASSESSMENT AND PLAN: .      ICD-10-CM   1. Chronic diastolic (congestive) heart failure (HCC)  I50.32  2. Severe aortic stenosis  I35.0     3. Paroxysmal atrial fibrillation (HCC)  I48.0     4. DNR (do not resuscitate)  Z66      Assessment & Plan Chronic diastolic heart failure Chronic diastolic heart failure is well-managed with no signs of fluid overload. Weight and rhythm are stable. Medications including Farxiga , Lasix , spironolactone , and Entresto  are effective. - Continue Farxiga  10 mg once a day - Continue Lasix  20 mg once a day - Continue spironolactone  25 mg once a day - Continue Entresto  24/26 mg once a day - Encourage use of incentive spirometry to expand lungs  Paroxysmal atrial fibrillation Paroxysmal atrial fibrillation is managed with Eliquis . She maintains a regular rhythm with no heart failure symptoms. - Continue Eliquis  5 mg twice a day  Nonrheumatic severe aortic stenosis, medically managed Nonrheumatic aortic stenosis is managed medically due to her preference to avoid hospital visits and procedures, considering her overall health and desire to avoid procedural complications. - Continue medical management for aortic stenosis  DNR Patient does not wish to proceed with any further evaluation for any procedures.  She wished to be DNR.  States that since making lifestyle changes especially with diet, she has not had any further acute decompensated heart failure and worsening dyspnea or leg edema.   Follow up: Office visit in 6 months or sooner if problems.  Signed,  Lisa Bergamo, MD, [facility] 12/14/2023,  3:28 PM [facility] [address] [place], [place] [address] Phone: [phone]. Fax:  [phone]

## 2023-12-14 ENCOUNTER — Ambulatory Visit: Admitting: Family Medicine

## 2023-12-14 ENCOUNTER — Ambulatory Visit: Attending: Cardiology | Admitting: Cardiology

## 2023-12-14 ENCOUNTER — Encounter: Payer: Self-pay | Admitting: Cardiology

## 2023-12-14 VITALS — BP 122/60 | HR 44 | Ht 63.0 in | Wt 192.0 lb

## 2023-12-14 DIAGNOSIS — I350 Nonrheumatic aortic (valve) stenosis: Secondary | ICD-10-CM | POA: Insufficient documentation

## 2023-12-14 DIAGNOSIS — I480 Paroxysmal atrial fibrillation: Secondary | ICD-10-CM | POA: Diagnosis present

## 2023-12-14 DIAGNOSIS — Z66 Do not resuscitate: Secondary | ICD-10-CM | POA: Insufficient documentation

## 2023-12-14 DIAGNOSIS — I5032 Chronic diastolic (congestive) heart failure: Secondary | ICD-10-CM | POA: Insufficient documentation

## 2023-12-14 NOTE — Patient Instructions (Signed)
 Medication Instructions:  No medication changes were made at this visit. Continue current regimen.   *If you need a refill on your cardiac medications before your next appointment, please call your pharmacy*  Lab Work: NONE If you have labs (blood work) drawn today and your tests are completely normal, you will receive your results only by: MyChart Message (if you have MyChart) OR A paper copy in the mail If you have any lab test that is abnormal or we need to change your treatment, we will call you to review the results.  Testing/Procedures: NONE  Follow-Up: At [facility], you and your health needs are our priority.  As part of our continuing mission to provide you with exceptional heart care, our providers are all part of one team.  This team includes your primary Cardiologist (physician) and Advanced Practice Providers or APPs (Physician Assistants and Nurse Practitioners) who all work together to provide you with the care you need, when you need it.  Your next appointment:   6 Months   Provider:   Gordy Bergamo, MD    We recommend signing up for the patient portal called MyChart.  Sign up information is provided on this After Visit Summary.  MyChart is used to connect with patients for Virtual Visits (Telemedicine).  Patients are able to view lab/test results, encounter notes, upcoming appointments, etc.  Non-urgent messages can be sent to your provider as well.   To learn more about what you can do with MyChart, go to [email or web address].

## 2023-12-18 ENCOUNTER — Other Ambulatory Visit (HOSPITAL_COMMUNITY): Payer: Self-pay | Admitting: Internal Medicine

## 2023-12-19 DIAGNOSIS — M810 Age-related osteoporosis without current pathological fracture: Secondary | ICD-10-CM | POA: Insufficient documentation

## 2023-12-21 ENCOUNTER — Ambulatory Visit (HOSPITAL_COMMUNITY)
Admission: RE | Admit: 2023-12-21 | Discharge: 2023-12-21 | Disposition: A | Discharge status: Home / Self Care | Source: Ambulatory Visit | Attending: Internal Medicine | Admitting: Internal Medicine

## 2023-12-21 VITALS — BP 135/56 | HR 71 | Temp 97.2°F | Resp 17

## 2023-12-21 DIAGNOSIS — M810 Age-related osteoporosis without current pathological fracture: Secondary | ICD-10-CM | POA: Diagnosis present

## 2023-12-21 MED ORDER — DENOSUMAB 60 MG/ML ~~LOC~~ SOSY
60.0000 mg | PREFILLED_SYRINGE | Freq: Once | SUBCUTANEOUS | Status: AC
  Administered 2023-12-21: 60 mg via SUBCUTANEOUS

## 2023-12-21 MED ORDER — DENOSUMAB 60 MG/ML ~~LOC~~ SOSY
PREFILLED_SYRINGE | SUBCUTANEOUS | Status: AC
  Filled 2023-12-21: qty 1

## 2023-12-28 ENCOUNTER — Other Ambulatory Visit: Payer: Self-pay

## 2023-12-28 ENCOUNTER — Ambulatory Visit: Admitting: Family Medicine

## 2023-12-28 VITALS — BP 120/84 | Ht 63.0 in

## 2023-12-28 DIAGNOSIS — M25511 Pain in right shoulder: Secondary | ICD-10-CM | POA: Diagnosis not present

## 2023-12-28 DIAGNOSIS — G8929 Other chronic pain: Secondary | ICD-10-CM

## 2023-12-28 DIAGNOSIS — M25512 Pain in left shoulder: Secondary | ICD-10-CM | POA: Diagnosis not present

## 2023-12-28 NOTE — Patient Instructions (Addendum)
 Thank you for coming in today.   You received an injection today. Seek immediate medical attention if the joint becomes red, extremely painful, or is oozing fluid.   Check back as needed

## 2023-12-28 NOTE — Progress Notes (Signed)
 [name], PhD, LAT, ATC acting as a scribe for Artist Lloyd, MD.  Lisa Mcclure is a 89 y.o. female who presents to [facility] today for f/u bilat shoulder pain and osteoporosis. Pt was last seen by Dr. Lloyd on 08/31/23 and was given bilat GH steroid injections. DEXA scan was also ordered, but never obtained.  Today, pt reports slight benefit from prior steroid injections. She notes her PCP got approval for her to be seen at [facility]. she is interested in dry needling.  Dx imaging: 06/14/22 R & L shoulder XR 07/10/19 R & L shoulder XR   Pertinent review of systems: No fevers or chills  Relevant historical information: Severe shoulder arthritis   Exam:  BP 120/84   Ht 5' 3 (1.6 m)   BMI 34.01 kg/m  General: Well Developed, well nourished, and in no acute distress.   MSK: Shoulders bilaterally decreased range of motion.    Lab and Radiology Results  Procedure: Real-time Ultrasound Guided Injection of left glenohumeral joint posterior approach Device: Philips Affiniti 50G/GE Logiq Images permanently stored and available for review in PACS Verbal informed consent obtained.  Discussed risks and benefits of procedure. Warned about infection, bleeding, hyperglycemia damage to structures among others. Patient expresses understanding and agreement Time-out conducted.   Noted no overlying erythema, induration, or other signs of local infection.   Skin prepped in a sterile fashion.   Local anesthesia: Topical Ethyl chloride.   With sterile technique and under real time ultrasound guidance: 40 mg of Kenalog  and 2 mL of Marcaine  injected into shoulder joint. Fluid seen entering the joint capsule.   Completed without difficulty   Pain immediately resolved suggesting accurate placement of the medication.   Advised to call if fevers/chills, erythema, induration, drainage, or persistent bleeding.   Images permanently stored and available for  review in the ultrasound unit.  Impression: Technically successful ultrasound guided injection.    Procedure: Real-time Ultrasound Guided Injection of right shoulder glenohumeral joint posterior approach Device: Philips Affiniti 50G/GE Logiq Images permanently stored and available for review in PACS Verbal informed consent obtained.  Discussed risks and benefits of procedure. Warned about infection, bleeding, hyperglycemia damage to structures among others. Patient expresses understanding and agreement Time-out conducted.   Noted no overlying erythema, induration, or other signs of local infection.   Skin prepped in a sterile fashion.   Local anesthesia: Topical Ethyl chloride.   With sterile technique and under real time ultrasound guidance: 40 mg of Kenalog  and 2 mL of Marcaine  injected into shoulder joint. Fluid seen entering the joint capsule.   Completed without difficulty   Pain immediately resolved suggesting accurate placement of the medication.   Advised to call if fevers/chills, erythema, induration, drainage, or persistent bleeding.   Images permanently stored and available for review in the ultrasound unit.  Impression: Technically successful ultrasound guided injection.         Assessment and Plan: 89 y.o. female with chronic bilateral shoulder pain due to severe DJD glenohumeral joint.  Plan for repeat steroid injection.  Dry needling with physical therapy could also be helpful.  Check back in 3 months.   PDMP not reviewed this encounter. Orders Placed This Encounter  Procedures   US  LIMITED JOINT SPACE STRUCTURES UP BILAT(NO LINKED CHARGES)    Reason for Exam (SYMPTOM  OR DIAGNOSIS REQUIRED):   bilateral shoulder pain    Preferred imaging location?:   [facility]   No  orders of the defined types were placed in this encounter.    Discussed warning signs or symptoms. Please see discharge instructions. Patient expresses  understanding.   The above documentation has been reviewed and is accurate and complete Artist Lloyd, M.D.

## 2024-01-11 ENCOUNTER — Other Ambulatory Visit (HOSPITAL_COMMUNITY)

## 2024-01-11 ENCOUNTER — Other Ambulatory Visit: Payer: Self-pay

## 2024-01-11 ENCOUNTER — Emergency Department (HOSPITAL_COMMUNITY)

## 2024-01-11 ENCOUNTER — Inpatient Hospital Stay (HOSPITAL_COMMUNITY)
Admission: EM | Admit: 2024-01-11 | Discharge: 2024-01-16 | DRG: 189 | Disposition: A | Discharge status: Home Health | Attending: Internal Medicine | Admitting: Internal Medicine

## 2024-01-11 ENCOUNTER — Inpatient Hospital Stay (HOSPITAL_COMMUNITY)

## 2024-01-11 ENCOUNTER — Encounter (HOSPITAL_COMMUNITY): Payer: Self-pay | Admitting: Emergency Medicine

## 2024-01-11 DIAGNOSIS — J440 Chronic obstructive pulmonary disease with acute lower respiratory infection: Secondary | ICD-10-CM | POA: Diagnosis present

## 2024-01-11 DIAGNOSIS — N1832 Chronic kidney disease, stage 3b: Secondary | ICD-10-CM | POA: Diagnosis present

## 2024-01-11 DIAGNOSIS — Z803 Family history of malignant neoplasm of breast: Secondary | ICD-10-CM

## 2024-01-11 DIAGNOSIS — I480 Paroxysmal atrial fibrillation: Secondary | ICD-10-CM | POA: Diagnosis present

## 2024-01-11 DIAGNOSIS — Z1152 Encounter for screening for COVID-19: Secondary | ICD-10-CM | POA: Diagnosis not present

## 2024-01-11 DIAGNOSIS — I350 Nonrheumatic aortic (valve) stenosis: Secondary | ICD-10-CM | POA: Diagnosis present

## 2024-01-11 DIAGNOSIS — I5032 Chronic diastolic (congestive) heart failure: Secondary | ICD-10-CM | POA: Diagnosis present

## 2024-01-11 DIAGNOSIS — Z602 Problems related to living alone: Secondary | ICD-10-CM | POA: Diagnosis present

## 2024-01-11 DIAGNOSIS — Z23 Encounter for immunization: Secondary | ICD-10-CM

## 2024-01-11 DIAGNOSIS — R58 Hemorrhage, not elsewhere classified: Secondary | ICD-10-CM | POA: Diagnosis not present

## 2024-01-11 DIAGNOSIS — E875 Hyperkalemia: Secondary | ICD-10-CM | POA: Diagnosis present

## 2024-01-11 DIAGNOSIS — Z79890 Hormone replacement therapy (postmenopausal): Secondary | ICD-10-CM

## 2024-01-11 DIAGNOSIS — Z8249 Family history of ischemic heart disease and other diseases of the circulatory system: Secondary | ICD-10-CM

## 2024-01-11 DIAGNOSIS — R7989 Other specified abnormal findings of blood chemistry: Secondary | ICD-10-CM | POA: Diagnosis not present

## 2024-01-11 DIAGNOSIS — Z90710 Acquired absence of both cervix and uterus: Secondary | ICD-10-CM

## 2024-01-11 DIAGNOSIS — I2510 Atherosclerotic heart disease of native coronary artery without angina pectoris: Secondary | ICD-10-CM | POA: Diagnosis present

## 2024-01-11 DIAGNOSIS — Z66 Do not resuscitate: Secondary | ICD-10-CM | POA: Diagnosis present

## 2024-01-11 DIAGNOSIS — Z515 Encounter for palliative care: Secondary | ICD-10-CM | POA: Diagnosis not present

## 2024-01-11 DIAGNOSIS — J9601 Acute respiratory failure with hypoxia: Secondary | ICD-10-CM | POA: Diagnosis not present

## 2024-01-11 DIAGNOSIS — D72829 Elevated white blood cell count, unspecified: Secondary | ICD-10-CM

## 2024-01-11 DIAGNOSIS — R1030 Lower abdominal pain, unspecified: Secondary | ICD-10-CM | POA: Diagnosis present

## 2024-01-11 DIAGNOSIS — F419 Anxiety disorder, unspecified: Secondary | ICD-10-CM | POA: Diagnosis present

## 2024-01-11 DIAGNOSIS — E039 Hypothyroidism, unspecified: Secondary | ICD-10-CM | POA: Diagnosis present

## 2024-01-11 DIAGNOSIS — G8929 Other chronic pain: Secondary | ICD-10-CM | POA: Diagnosis present

## 2024-01-11 DIAGNOSIS — Z825 Family history of asthma and other chronic lower respiratory diseases: Secondary | ICD-10-CM

## 2024-01-11 DIAGNOSIS — G4733 Obstructive sleep apnea (adult) (pediatric): Secondary | ICD-10-CM | POA: Diagnosis present

## 2024-01-11 DIAGNOSIS — Z79899 Other long term (current) drug therapy: Secondary | ICD-10-CM

## 2024-01-11 DIAGNOSIS — I130 Hypertensive heart and chronic kidney disease with heart failure and stage 1 through stage 4 chronic kidney disease, or unspecified chronic kidney disease: Secondary | ICD-10-CM | POA: Diagnosis present

## 2024-01-11 DIAGNOSIS — R051 Acute cough: Secondary | ICD-10-CM

## 2024-01-11 DIAGNOSIS — Z7901 Long term (current) use of anticoagulants: Secondary | ICD-10-CM

## 2024-01-11 DIAGNOSIS — E66811 Obesity, class 1: Secondary | ICD-10-CM | POA: Diagnosis present

## 2024-01-11 DIAGNOSIS — G2581 Restless legs syndrome: Secondary | ICD-10-CM | POA: Diagnosis present

## 2024-01-11 DIAGNOSIS — E872 Acidosis, unspecified: Secondary | ICD-10-CM | POA: Diagnosis present

## 2024-01-11 DIAGNOSIS — Z993 Dependence on wheelchair: Secondary | ICD-10-CM

## 2024-01-11 DIAGNOSIS — R6520 Severe sepsis without septic shock: Principal | ICD-10-CM

## 2024-01-11 DIAGNOSIS — E871 Hypo-osmolality and hyponatremia: Secondary | ICD-10-CM | POA: Diagnosis present

## 2024-01-11 DIAGNOSIS — Z6834 Body mass index (BMI) 34.0-34.9, adult: Secondary | ICD-10-CM | POA: Diagnosis not present

## 2024-01-11 DIAGNOSIS — Z87891 Personal history of nicotine dependence: Secondary | ICD-10-CM

## 2024-01-11 DIAGNOSIS — Z87440 Personal history of urinary (tract) infections: Secondary | ICD-10-CM

## 2024-01-11 DIAGNOSIS — I5031 Acute diastolic (congestive) heart failure: Secondary | ICD-10-CM | POA: Diagnosis not present

## 2024-01-11 DIAGNOSIS — Z888 Allergy status to other drugs, medicaments and biological substances status: Secondary | ICD-10-CM

## 2024-01-11 DIAGNOSIS — K4020 Bilateral inguinal hernia, without obstruction or gangrene, not specified as recurrent: Secondary | ICD-10-CM | POA: Diagnosis present

## 2024-01-11 DIAGNOSIS — Z801 Family history of malignant neoplasm of trachea, bronchus and lung: Secondary | ICD-10-CM

## 2024-01-11 DIAGNOSIS — I951 Orthostatic hypotension: Secondary | ICD-10-CM | POA: Diagnosis present

## 2024-01-11 DIAGNOSIS — N83202 Unspecified ovarian cyst, left side: Secondary | ICD-10-CM | POA: Diagnosis present

## 2024-01-11 DIAGNOSIS — I2489 Other forms of acute ischemic heart disease: Secondary | ICD-10-CM | POA: Diagnosis present

## 2024-01-11 DIAGNOSIS — N1831 Chronic kidney disease, stage 3a: Secondary | ICD-10-CM | POA: Diagnosis not present

## 2024-01-11 DIAGNOSIS — K219 Gastro-esophageal reflux disease without esophagitis: Secondary | ICD-10-CM | POA: Diagnosis present

## 2024-01-11 DIAGNOSIS — I714 Abdominal aortic aneurysm, without rupture, unspecified: Secondary | ICD-10-CM | POA: Diagnosis present

## 2024-01-11 DIAGNOSIS — M545 Low back pain, unspecified: Secondary | ICD-10-CM | POA: Diagnosis present

## 2024-01-11 DIAGNOSIS — M069 Rheumatoid arthritis, unspecified: Secondary | ICD-10-CM | POA: Diagnosis present

## 2024-01-11 DIAGNOSIS — I493 Ventricular premature depolarization: Secondary | ICD-10-CM | POA: Diagnosis not present

## 2024-01-11 DIAGNOSIS — Z95828 Presence of other vascular implants and grafts: Secondary | ICD-10-CM

## 2024-01-11 DIAGNOSIS — A419 Sepsis, unspecified organism: Principal | ICD-10-CM

## 2024-01-11 DIAGNOSIS — Z8679 Personal history of other diseases of the circulatory system: Secondary | ICD-10-CM

## 2024-01-11 DIAGNOSIS — B349 Viral infection, unspecified: Secondary | ICD-10-CM | POA: Diagnosis present

## 2024-01-11 HISTORY — DX: Nonrheumatic aortic (valve) stenosis: I35.0

## 2024-01-11 LAB — CBC WITH DIFFERENTIAL/PLATELET
Abs Immature Granulocytes: 0.07 K/uL (ref 0.00–0.07)
Basophils Absolute: 0 K/uL (ref 0.0–0.1)
Basophils Relative: 0 %
Eosinophils Absolute: 0.4 K/uL (ref 0.0–0.5)
Eosinophils Relative: 3 %
HCT: 41.1 % (ref 36.0–46.0)
Hemoglobin: 12.4 g/dL (ref 12.0–15.0)
Immature Granulocytes: 1 %
Lymphocytes Relative: 1 %
Lymphs Abs: 0.2 K/uL — ABNORMAL LOW (ref 0.7–4.0)
MCH: 29.7 pg (ref 26.0–34.0)
MCHC: 30.2 g/dL (ref 30.0–36.0)
MCV: 98.6 fL (ref 80.0–100.0)
Monocytes Absolute: 0.8 K/uL (ref 0.1–1.0)
Monocytes Relative: 6 %
Neutro Abs: 11.3 K/uL — ABNORMAL HIGH (ref 1.7–7.7)
Neutrophils Relative %: 89 %
Platelets: 247 K/uL (ref 150–400)
RBC: 4.17 MIL/uL (ref 3.87–5.11)
RDW: 13.5 % (ref 11.5–15.5)
WBC: 12.7 K/uL — ABNORMAL HIGH (ref 4.0–10.5)
nRBC: 0 % (ref 0.0–0.2)

## 2024-01-11 LAB — TROPONIN T, HIGH SENSITIVITY
Troponin T High Sensitivity: 95 ng/L — ABNORMAL HIGH (ref 0–19)
Troponin T High Sensitivity: 125 ng/L (ref 0–19)
Troponin T High Sensitivity: 108 ng/L (ref 0–19)

## 2024-01-11 LAB — CBC
HCT: 37.7 % (ref 36.0–46.0)
Hemoglobin: 11.2 g/dL — ABNORMAL LOW (ref 12.0–15.0)
MCH: 29.1 pg (ref 26.0–34.0)
MCHC: 29.7 g/dL — ABNORMAL LOW (ref 30.0–36.0)
MCV: 97.9 fL (ref 80.0–100.0)
Platelets: 200 K/uL (ref 150–400)
RBC: 3.85 MIL/uL — ABNORMAL LOW (ref 3.87–5.11)
RDW: 13.8 % (ref 11.5–15.5)
WBC: 12.2 K/uL — ABNORMAL HIGH (ref 4.0–10.5)
nRBC: 0 % (ref 0.0–0.2)

## 2024-01-11 LAB — URINALYSIS, W/ REFLEX TO CULTURE (INFECTION SUSPECTED)
Bacteria, UA: NONE SEEN
Bilirubin Urine: NEGATIVE
Glucose, UA: >=500 mg/dL — AB
Hgb urine dipstick: NEGATIVE
Ketones, ur: NEGATIVE mg/dL
Leukocytes,Ua: NEGATIVE
Nitrite: NEGATIVE
Protein, ur: NEGATIVE mg/dL
Specific Gravity, Urine: 1.037 — ABNORMAL HIGH (ref 1.005–1.030)
pH: 7 (ref 5.0–8.0)

## 2024-01-11 LAB — PRO BRAIN NATRIURETIC PEPTIDE: Pro Brain Natriuretic Peptide: 1619 pg/mL — ABNORMAL HIGH (ref <300.0)

## 2024-01-11 LAB — COMPREHENSIVE METABOLIC PANEL WITH GFR
ALT: 7 U/L (ref 0–44)
AST: 17 U/L (ref 15–41)
Albumin: 3.9 g/dL (ref 3.5–5.0)
Alkaline Phosphatase: 101 U/L (ref 38–126)
Anion gap: 11 (ref 5–15)
BUN: 30 mg/dL — ABNORMAL HIGH (ref 8–23)
CO2: 21 mmol/L — ABNORMAL LOW (ref 22–32)
Calcium: 8 mg/dL — ABNORMAL LOW (ref 8.9–10.3)
Chloride: 103 mmol/L (ref 98–111)
Creatinine, Ser: 0.98 mg/dL (ref 0.44–1.00)
GFR, Estimated: 55 mL/min — ABNORMAL LOW (ref >60)
Glucose, Bld: 99 mg/dL (ref 70–99)
Potassium: 5.4 mmol/L — ABNORMAL HIGH (ref 3.5–5.1)
Sodium: 134 mmol/L — ABNORMAL LOW (ref 135–145)
Total Bilirubin: 0.5 mg/dL (ref 0.0–1.2)
Total Protein: 7.3 g/dL (ref 6.5–8.1)

## 2024-01-11 LAB — RESP PANEL BY RT-PCR (RSV, FLU A&B, COVID)  RVPGX2
Influenza A by PCR: NEGATIVE
Influenza B by PCR: NEGATIVE
Resp Syncytial Virus by PCR: NEGATIVE
SARS Coronavirus 2 by RT PCR: NEGATIVE

## 2024-01-11 LAB — I-STAT CG4 LACTIC ACID, ED
Lactic Acid, Venous: 1.9 mmol/L (ref 0.5–1.9)
Lactic Acid, Venous: 2.2 mmol/L (ref 0.5–1.9)

## 2024-01-11 LAB — TSH: TSH: 0.535 u[IU]/mL (ref 0.350–4.500)

## 2024-01-11 LAB — PHOSPHORUS: Phosphorus: 2.6 mg/dL (ref 2.5–4.6)

## 2024-01-11 LAB — CREATININE, SERUM
Creatinine, Ser: 1.19 mg/dL — ABNORMAL HIGH (ref 0.44–1.00)
GFR, Estimated: 44 mL/min — ABNORMAL LOW (ref >60)

## 2024-01-11 LAB — LIPASE, BLOOD: Lipase: 15 U/L (ref 11–51)

## 2024-01-11 LAB — MAGNESIUM: Magnesium: 2.4 mg/dL (ref 1.7–2.4)

## 2024-01-11 MED ORDER — ONDANSETRON HCL 4 MG PO TABS: 4.0000 mg | ORAL_TABLET | Freq: Four times a day (QID) | ORAL | Status: DC | PRN

## 2024-01-11 MED ORDER — BUSPIRONE HCL 5 MG PO TABS: 7.5000 mg | ORAL_TABLET | Freq: Every day | ORAL | Status: DC | PRN

## 2024-01-11 MED ORDER — SODIUM CHLORIDE 0.9 % IV SOLN
500.0000 mg | Freq: Once | INTRAVENOUS | Status: AC
  Administered 2024-01-11: 500 mg via INTRAVENOUS
  Filled 2024-01-11: qty 5

## 2024-01-11 MED ORDER — ACETAMINOPHEN 325 MG PO TABS
650.0000 mg | ORAL_TABLET | Freq: Once | ORAL | Status: AC
  Administered 2024-01-11: 650 mg via ORAL
  Filled 2024-01-11: qty 2

## 2024-01-11 MED ORDER — DAPAGLIFLOZIN PROPANEDIOL 10 MG PO TABS
10.0000 mg | ORAL_TABLET | Freq: Every day | ORAL | Status: DC
  Administered 2024-01-12 – 2024-01-16 (×5): 10 mg via ORAL
  Filled 2024-01-11 (×5): qty 1

## 2024-01-11 MED ORDER — BUSPIRONE HCL 5 MG PO TABS: 7.5000 mg | ORAL_TABLET | ORAL | Status: DC

## 2024-01-11 MED ORDER — NORTRIPTYLINE HCL 10 MG PO CAPS
10.0000 mg | ORAL_CAPSULE | Freq: Every day | ORAL | Status: DC
  Administered 2024-01-12 – 2024-01-15 (×5): 10 mg via ORAL
  Filled 2024-01-11 (×7): qty 1

## 2024-01-11 MED ORDER — FENTANYL CITRATE PF 50 MCG/ML IJ SOSY
12.5000 ug | PREFILLED_SYRINGE | Freq: Once | INTRAMUSCULAR | Status: AC
  Administered 2024-01-11: 12.5 ug via INTRAVENOUS
  Filled 2024-01-11: qty 1

## 2024-01-11 MED ORDER — LACTATED RINGERS IV BOLUS
1000.0000 mL | Freq: Once | INTRAVENOUS | Status: AC
  Administered 2024-01-11: 1000 mL via INTRAVENOUS

## 2024-01-11 MED ORDER — FUROSEMIDE 10 MG/ML IJ SOLN
40.0000 mg | Freq: Every day | INTRAMUSCULAR | Status: DC
Start: 2024-01-11 — End: 2024-01-12
  Administered 2024-01-11: 40 mg via INTRAVENOUS
  Filled 2024-01-11: qty 4

## 2024-01-11 MED ORDER — ACETAMINOPHEN 325 MG PO TABS
650.0000 mg | ORAL_TABLET | Freq: Four times a day (QID) | ORAL | Status: DC | PRN
  Administered 2024-01-12 – 2024-01-14 (×6): 650 mg via ORAL
  Filled 2024-01-11 (×6): qty 2

## 2024-01-11 MED ORDER — INFLUENZA VAC SPLIT HIGH-DOSE 0.5 ML IM SUSY
0.5000 mL | PREFILLED_SYRINGE | INTRAMUSCULAR | Status: AC
  Administered 2024-01-14: 0.5 mL via INTRAMUSCULAR
  Filled 2024-01-11: qty 0.5

## 2024-01-11 MED ORDER — OXYCODONE HCL 5 MG PO TABS
5.0000 mg | ORAL_TABLET | Freq: Once | ORAL | Status: AC
  Administered 2024-01-11: 5 mg via ORAL
  Filled 2024-01-11: qty 1

## 2024-01-11 MED ORDER — ROPINIROLE HCL 1 MG PO TABS: 1.0000 mg | ORAL_TABLET | ORAL | Status: DC

## 2024-01-11 MED ORDER — SODIUM CHLORIDE 0.9 % IV SOLN: 500.0000 mg | INTRAVENOUS | Status: DC

## 2024-01-11 MED ORDER — IOHEXOL 300 MG/ML  SOLN
100.0000 mL | Freq: Once | INTRAMUSCULAR | Status: AC | PRN
  Administered 2024-01-11: 100 mL via INTRAVENOUS

## 2024-01-11 MED ORDER — SODIUM ZIRCONIUM CYCLOSILICATE 10 G PO PACK
10.0000 g | PACK | Freq: Once | ORAL | Status: AC
  Administered 2024-01-11: 10 g via ORAL
  Filled 2024-01-11: qty 1

## 2024-01-11 MED ORDER — ACETAMINOPHEN 650 MG RE SUPP: 650.0000 mg | Freq: Four times a day (QID) | RECTAL | Status: DC | PRN

## 2024-01-11 MED ORDER — SODIUM ZIRCONIUM CYCLOSILICATE 10 G PO PACK
10.0000 g | PACK | Freq: Once | ORAL | Status: DC
Start: 2024-01-11 — End: 2024-01-12

## 2024-01-11 MED ORDER — BUSPIRONE HCL 5 MG PO TABS
7.5000 mg | ORAL_TABLET | Freq: Every day | ORAL | Status: DC
  Administered 2024-01-12 – 2024-01-15 (×5): 7.5 mg via ORAL
  Filled 2024-01-11 (×5): qty 2

## 2024-01-11 MED ORDER — FUROSEMIDE 40 MG PO TABS: 20.0000 mg | ORAL_TABLET | Freq: Every day | ORAL | Status: DC

## 2024-01-11 MED ORDER — HEPARIN SODIUM (PORCINE) 5000 UNIT/ML IJ SOLN
5000.0000 [IU] | Freq: Three times a day (TID) | INTRAMUSCULAR | Status: DC
Start: 2024-01-11 — End: 2024-01-11

## 2024-01-11 MED ORDER — ROPINIROLE HCL 1 MG PO TABS: 2.0000 mg | ORAL_TABLET | Freq: Every day | ORAL | Status: DC

## 2024-01-11 MED ORDER — OXYCODONE HCL 5 MG PO TABS
5.0000 mg | ORAL_TABLET | ORAL | Status: DC | PRN
  Administered 2024-01-11 – 2024-01-16 (×17): 5 mg via ORAL
  Filled 2024-01-11 (×17): qty 1

## 2024-01-11 MED ORDER — SODIUM CHLORIDE 0.9 % IV SOLN: 1.0000 g | INTRAVENOUS | Status: DC

## 2024-01-11 MED ORDER — SACUBITRIL-VALSARTAN 24-26 MG PO TABS: 1.0000 | ORAL_TABLET | Freq: Two times a day (BID) | ORAL | Status: DC

## 2024-01-11 MED ORDER — SODIUM CHLORIDE 0.9 % IV SOLN
1.0000 g | Freq: Once | INTRAVENOUS | Status: AC
  Administered 2024-01-11: 1 g via INTRAVENOUS
  Filled 2024-01-11: qty 10

## 2024-01-11 MED ORDER — BUDESONIDE 0.5 MG/2ML IN SUSP
0.5000 mg | Freq: Every day | RESPIRATORY_TRACT | Status: DC
  Administered 2024-01-11 – 2024-01-16 (×6): 0.5 mg via RESPIRATORY_TRACT
  Filled 2024-01-11 (×6): qty 2

## 2024-01-11 MED ORDER — LEVOTHYROXINE SODIUM 25 MCG PO TABS
125.0000 ug | ORAL_TABLET | Freq: Every day | ORAL | Status: DC
  Administered 2024-01-12 – 2024-01-16 (×5): 125 ug via ORAL
  Filled 2024-01-11 (×5): qty 1

## 2024-01-11 MED ORDER — APIXABAN 2.5 MG PO TABS
5.0000 mg | ORAL_TABLET | Freq: Two times a day (BID) | ORAL | Status: DC
  Administered 2024-01-12 – 2024-01-16 (×10): 5 mg via ORAL
  Filled 2024-01-11 (×10): qty 2

## 2024-01-11 MED ORDER — ONDANSETRON HCL 4 MG/2ML IJ SOLN
4.0000 mg | Freq: Four times a day (QID) | INTRAMUSCULAR | Status: DC | PRN
  Administered 2024-01-13: 4 mg via INTRAVENOUS
  Filled 2024-01-11: qty 2

## 2024-01-11 MED ORDER — ROPINIROLE HCL 1 MG PO TABS
1.0000 mg | ORAL_TABLET | Freq: Every day | ORAL | Status: DC
Start: 2024-01-11 — End: 2024-01-12
  Administered 2024-01-12: 1 mg via ORAL
  Filled 2024-01-11: qty 1

## 2024-01-11 NOTE — ED Notes (Signed)
EKG handed to Dr. Billy Fischer

## 2024-01-11 NOTE — ED Notes (Signed)
 Pt had urinated bed. New bed sheets, bed chucks, and gown provided for pt. Pt repositioned in bed. Call bell within reach. Daughter at bedside

## 2024-01-11 NOTE — ED Notes (Signed)
 Floor coverage paged for critical troponin

## 2024-01-11 NOTE — ED Triage Notes (Signed)
 Pt BIB EMS from home, c/o chills, shaky, and fever since this morning. Recently treated for UTI with oral ABT. Incontinent and O2 at baseline. Also c/o SOB and pelvic pain.   BP 135/76 P 104 RR 22

## 2024-01-11 NOTE — ED Provider Notes (Signed)
 [place] [facility]ERGENCY DEPARTMENT AT [facility] Provider Note   CSN: [id number] Arrival date & time: 01/11/24  9247     Patient presents with: Generalized Body Aches   Lisa Mcclure is a 89 y.o. female.   HPI     89 year old female with a history of chronic diastolic CHF, CKD, hypothyroidism, paroxysmal atrial fibrillation on Eliquis , rheumatoid arthritis, hypertension who presents with concern for chills, cough, abdominal pain.   Got up to let dog out then sat in living room and could not stop shaking, like rigors and chills. Has had it happen before but never extended that long.    Cough for days No shortness of breath Not really fatigue No chest pain  Runny nose, no sore throat Abdominal pain LLQ this AM No diarrhea or constipation Was having dysuria, was on abx for UTI every 6 hours, called in not feeling better and was placed on another abx, not having the burning anymore-still on abx now No nausea or vomiting No headache or falls  Past Medical History:  Diagnosis Date   Acute renal failure superimposed on stage 3b chronic kidney disease (HCC) 06/15/2021   Anemia    h/o hemorrhoidal bleeding and blood transfusion   Chronic diastolic CHF (congestive heart failure) (HCC)    Depression    Diverticulosis    DOE (dyspnea on exertion)    Esophageal reflux    GERD (gastroesophageal reflux disease)    Hypothyroidism    Insomnia    LBP (low back pain)    OAB (overactive bladder)    Obesity    OSA (obstructive sleep apnea)    Osteoarthritis    Paroxysmal atrial fibrillation (HCC)    Rheumatoid arthritis(714.0)    Shoulder pain, bilateral    Unspecified essential hypertension    Venous insufficiency     Past Surgical History:  Procedure Laterality Date   ABDOMINAL AORTIC ENDOVASCULAR STENT GRAFT N/A 12/20/2019   Procedure: Aortogram including catheter selection of aorta and bilateral iliac arteriogram, Endovascular repair of infrarenal abdominal  aortic aneurysm with bifurcated stent graft (26 mm x 14 x 12 main body, right bell bottom with a 20 mm x 10 cm piece, and left bell bottom with a 16 mm x 12 cm piece) ;  Surgeon: Gretta Lonni PARAS, MD;  Location: [facility] OR;  Service: Vascular;  Laterality: N/A;   APPENDECTOMY  1953   BIOPSY  06/18/2021   Procedure: BIOPSY;  Surgeon: Saintclair Jasper, MD;  Location: [facility] ENDOSCOPY;  Service: Gastroenterology;;   bladder abduction-1996  1996   breast biopsy Right 1980   CARDIOVERSION N/A 12/24/2019   Procedure: CARDIOVERSION;  Surgeon: Ladona Heinz, MD;  Location: [facility] ENDOSCOPY;  Service: Cardiovascular;  Laterality: N/A;   CARDIOVERSION N/A 04/25/2022   Procedure: CARDIOVERSION;  Surgeon: Ladona Heinz, MD;  Location: [facility] ENDOSCOPY;  Service: Cardiovascular;  Laterality: N/A;   CESAREAN SECTION     1957, 1961, 1964   COSMETIC SURGERY  1996   CYSTOCELE REPAIR     ESOPHAGOGASTRODUODENOSCOPY (EGD) WITH PROPOFOL  N/A 06/18/2021   Procedure: ESOPHAGOGASTRODUODENOSCOPY (EGD) WITH PROPOFOL ;  Surgeon: Saintclair Jasper, MD;  Location: [facility] ENDOSCOPY;  Service: Gastroenterology;  Laterality: N/A;   FLEXIBLE SIGMOIDOSCOPY N/A 04/10/2015   Procedure: FLEXIBLE SIGMOIDOSCOPY;  Surgeon: Elsie Cree, MD;  Location: [facility] ENDOSCOPY;  Service: Endoscopy;  Laterality: N/A;   INTRAMEDULLARY (IM) NAIL INTERTROCHANTERIC Left 02/03/2023   Procedure: LEFT INTRAMEDULLARY (IM) NAIL INTERTROCHANTERIC;  Surgeon: Vernetta Lonni GRADE, MD;  Location: [facility] OR;  Service: Orthopedics;  Laterality:  Left;   IR AORTAGRAM ABDOMINAL SERIALOGRAM  10/26/2022   IR EMBO ARTERIAL NOT HEMORR HEMANG INC GUIDE ROADMAPPING  10/24/2022   IR RADIOLOGIST EVAL & MGMT  09/13/2022   IR RADIOLOGIST EVAL & MGMT  11/24/2022   IR US  GUIDE VASC ACCESS LEFT  10/26/2022   IR US  GUIDE VASC ACCESS LEFT  10/26/2022   IR US  GUIDE VASC ACCESS LEFT  10/26/2022   IR US  GUIDE VASC ACCESS LEFT  10/26/2022   knee arthroscopy Right 1996, 2010   KNEE ARTHROSCOPY W/ AUTOGENOUS CARTILAGE  IMPLANTATION (ACI) PROCEDURE Left 1994, 1995   REFRACTIVE SURGERY  01/2020   SHOULDER SURGERY  1990   TEE WITHOUT CARDIOVERSION N/A 12/24/2019   Procedure: TRANSESOPHAGEAL ECHOCARDIOGRAM (TEE);  Surgeon: Ladona Heinz, MD;  Location: [facility] ENDOSCOPY;  Service: Cardiovascular;  Laterality: N/A;   TEE WITHOUT CARDIOVERSION N/A 06/23/2022   Procedure: TRANSESOPHAGEAL ECHOCARDIOGRAM (TEE);  Surgeon: Dewane Shiner, DO;  Location: [facility] ENDOSCOPY;  Service: Cardiovascular;  Laterality: N/A;   TOTAL ABDOMINAL HYSTERECTOMY  1972   ULTRASOUND GUIDANCE FOR VASCULAR ACCESS Bilateral 12/20/2019   Procedure: Ultrasound-guided access of bilateral common femoral arteries for delivery of endograft and percutaneous closure;  Surgeon: Gretta Lonni PARAS, MD;  Location: [facility] OR;  Service: Vascular;  Laterality: Bilateral;   VESICOVAGINAL FISTULA CLOSURE W/ TAH     WRIST SURGERY  1967    Prior to Admission medications   Medication Sig Start Date End Date Taking? Authorizing Provider  ALEVE ARTHRITIS PAIN 1 % GEL Apply 2 g topically 4 (four) times daily as needed (for pain).    [provider]  apixaban  (ELIQUIS ) 5 MG TABS tablet Take 1 tablet (5 mg total) by mouth 2 (two) times daily. 03/13/23   Ladona Heinz, MD  budesonide  (PULMICORT ) 0.5 MG/2ML nebulizer solution Take 2 mLs (0.5 mg total) by nebulization in the morning and at bedtime. Patient taking differently: Take 0.5 mg by nebulization daily. 03/22/22   Hope Almarie ORN, NP  busPIRone  (BUSPAR ) 7.5 MG tablet Take 7.5 mg by mouth See admin instructions. Take 7.5 mg by mouth at bedtime and an additional 7.5 mg once a day as needed for anxiety    [provider]  Cholecalciferol  (VITAMIN D3) 125 MCG (5000 UT) CAPS Take 1 capsule by mouth daily.    [provider]  dapagliflozin  propanediol (FARXIGA ) 10 MG TABS tablet Take 1 tablet (10 mg total) by mouth daily before breakfast. 12/01/23   Ladona Heinz, MD  diclofenac  sodium (VOLTAREN ) 1 % GEL Apply  2.25 g topically 3 (three) times daily as needed (for pain). 10/23/18   [provider]  furosemide  (LASIX ) 20 MG tablet Take 1 tablet (20 mg total) by mouth daily. 09/25/23   Ladona Heinz, MD  latanoprost  (XALATAN ) 0.005 % ophthalmic solution Place 1 drop into both eyes at bedtime.    [provider]  levothyroxine  (SYNTHROID ) 125 MCG tablet Take 125 mcg by mouth daily before breakfast.     [provider]  melatonin 3 MG TABS tablet Take 3 mg by mouth at bedtime.    [provider]  methocarbamol  (ROBAXIN ) 500 MG tablet Take 500 mg by mouth every 6 (six) hours as needed. 06/26/23   [provider]  Multiple Vitamins-Minerals (PRESERVISION AREDS 2 PO) Take 1 capsule by mouth in the morning and at bedtime.    [provider]  nortriptyline  (PAMELOR ) 10 MG capsule Take 10 mg by mouth at bedtime. 08/31/22   [provider]  polyethylene glycol (  MIRALAX  / GLYCOLAX ) 17 g packet Take 17 g by mouth 2 (two) times daily. Decrease to daily dosing if developing diarrhea/watery stools Patient taking differently: Take 17 g by mouth daily as needed. Decrease to daily dosing if developing diarrhea/watery stools 02/07/23   Briana Elgin LABOR, MD  rOPINIRole  (REQUIP ) 1 MG tablet Take 1-2 mg by mouth See admin instructions. Take 2mg  by mouth at 1 PM and 1mg  at 9 PM 06/19/15   [provider]  sacubitril -valsartan  (ENTRESTO ) 24-26 MG Take 1 tablet by mouth 2 (two) times daily. 02/06/23   Ladona Heinz, MD  spironolactone  (ALDACTONE ) 25 MG tablet Take 25 mg by mouth daily. 11/24/22   [provider]  SYSTANE COMPLETE PF 0.6 % SOLN Place 1 drop into both eyes 4 (four) times daily as needed (for dryness).    [provider]    Allergies: Statins, Atorvastatin, Gabapentin, and Methocarbamol     Review of Systems  Updated Vital Signs BP 126/68   Pulse (!) 118   Temp 98.5 F (36.9 C)   Resp (!) 23   SpO2 100%   Physical Exam Vitals and  nursing note reviewed.  Constitutional:      General: She is not in acute distress.    Appearance: She is well-developed. She is not diaphoretic.  HENT:     Head: Normocephalic and atraumatic.  Eyes:     Conjunctiva/sclera: Conjunctivae normal.  Cardiovascular:     Rate and Rhythm: Regular rhythm. Tachycardia present.     Heart sounds: Normal heart sounds. No murmur heard.    No friction rub. No gallop.  Pulmonary:     Effort: Pulmonary effort is normal. No respiratory distress.     Breath sounds: Normal breath sounds. No wheezing or rales.  Abdominal:     General: There is no distension.     Palpations: Abdomen is soft.     Tenderness: There is abdominal tenderness (LLQ, RLQ, suprapubic). There is no guarding.  Musculoskeletal:        General: No swelling.     Cervical back: Normal range of motion.     Right lower leg: No edema.     Left lower leg: No edema.  Skin:    General: Skin is warm and dry.     Findings: No erythema or rash.  Neurological:     Mental Status: She is alert and oriented to person, place, and time.     (all labs ordered are listed, but only abnormal results are displayed) Labs Reviewed  CBC WITH DIFFERENTIAL/PLATELET - Abnormal; Notable for the following components:      Result Value   WBC 12.7 (*)    Neutro Abs 11.3 (*)    Lymphs Abs 0.2 (*)    All other components within normal limits  COMPREHENSIVE METABOLIC PANEL WITH GFR - Abnormal; Notable for the following components:   Sodium 134 (*)    Potassium 5.4 (*)    CO2 21 (*)    BUN 30 (*)    Calcium 8.0 (*)    GFR, Estimated 55 (*)    All other components within normal limits  URINALYSIS, W/ REFLEX TO CULTURE (INFECTION SUSPECTED) - Abnormal; Notable for the following components:   Specific Gravity, Urine 1.037 (*)    Glucose, UA >=500 (*)    All other components within normal limits  PRO BRAIN NATRIURETIC PEPTIDE - Abnormal; Notable for the following components:   Pro Brain Natriuretic  Peptide 1,619.0 (*)    All other  components within normal limits  I-STAT CG4 LACTIC ACID, ED - Abnormal; Notable for the following components:   Lactic Acid, Venous 2.2 (*)    All other components within normal limits  TROPONIN T, HIGH SENSITIVITY - Abnormal; Notable for the following components:   Troponin T High Sensitivity 95 (*)    All other components within normal limits  RESP PANEL BY RT-PCR (RSV, FLU A&B, COVID)  RVPGX2  CULTURE, BLOOD (ROUTINE X 2)  CULTURE, BLOOD (ROUTINE X 2)  URINE CULTURE  RESPIRATORY PANEL BY PCR  LIPASE, BLOOD  LACTIC ACID, PLASMA  CBC  CREATININE, SERUM  MAGNESIUM   PHOSPHORUS  TSH  I-STAT CG4 LACTIC ACID, ED  TROPONIN T, HIGH SENSITIVITY  TROPONIN T, HIGH SENSITIVITY    EKG: EKG Interpretation Date/Time:  Thursday January 11 2024 12:55:49 EDT Ventricular Rate:  116 PR Interval:  183 QRS Duration:  151 QT Interval:  300 QTC Calculation: 417 R Axis:   43  Text Interpretation: Sinus tachycardia Ventricular tachycardia, unsustained Nonspecific intraventricular conduction delay No significant change since last tracing Confirmed by Dreama Longs ([id number]) on 01/11/2024 2:56:22 PM  Radiology: CT ABDOMEN PELVIS W CONTRAST Result Date: 01/11/2024 CLINICAL DATA:  Left lower quadrant abdominal pain, chills and fever since this morning. Recently treated for a urinary tract infection. Pelvic pain. Shortness of breath. EXAM: CT ABDOMEN AND PELVIS WITH CONTRAST TECHNIQUE: Multidetector CT imaging of the abdomen and pelvis was performed using the standard protocol following bolus administration of intravenous contrast. RADIATION DOSE REDUCTION: This exam was performed according to the departmental dose-optimization program which includes automated exposure control, adjustment of the mA and/or kV according to patient size and/or use of iterative reconstruction technique. CONTRAST:  OMNIPAQUE  IOHEXOL  300 MG/ML  SOLN COMPARISON:  07/02/2022 FINDINGS: Lower  chest: Atheromatous calcifications, including the coronary arteries and aorta. Dense mitral valve annulus calcifications. Mildly enlarged heart. No pericardial effusion. Mild-to-moderate dependent atelectasis at both lung bases. Hepatobiliary: No focal liver abnormality is seen. No gallstones, gallbladder wall thickening, or biliary dilatation. Pancreas: Moderate diffuse pancreatic atrophy. Spleen: Normal in size without focal abnormality. Adrenals/Urinary Tract: Adrenal glands are unremarkable. Kidneys are normal, without renal calculi, focal lesion, or hydronephrosis. Bladder is unremarkable. There is some air in the urinary bladder, compatible with recent catheterization. Stomach/Bowel: Scattered colonic diverticula, most numerous in the right colon and sigmoid colon, without evidence of diverticulitis. Surgically absent appendix. Unremarkable stomach and small bowel. Vascular/Lymphatic: Extensive atheromatous arterial calcifications. Aorto bi-iliac stent within a thrombosed infrarenal abdominal aortic aneurysm measuring 6.7 cm in maximum diameter. There are multiple interval embolization coils in the region of the previously demonstrated endoleak. There is a small amount of developing calcification within the thrombus on the right on image number 43/11, measuring 209 Hounsfield units in density and a small amount of developing curvilinear calcification within the thrombus posteriorly on the left on image number 46/11. No enlarged lymph nodes. Reproductive: A 4.5 cm left adnexal simple appearing cyst measuring 7 Hounsfield units in density on image number 68/11 is unchanged other than a slight increase in size from 4.1 cm in maximum diameter previously. Surgically absent uterus and ovaries. Other: Large left inguinal hernia containing fat and small to moderate-sized right inguinal hernia containing fat. Musculoskeletal: Left hip fixation hardware. Lower thoracic spine degenerative changes. IMPRESSION: 1. No acute  abnormality. 2. Colonic diverticulosis without evidence of diverticulitis. 3. 4.5 cm left adnexal simple appearing cyst, unchanged other than a slight increase in size from 4.1 cm in maximum diameter previously. Recommend  follow-up pelvic ultrasound in 12 months. 4. Large left inguinal hernia containing fat and small to moderate-sized right inguinal hernia containing fat. 5. Aorto bi-iliac stent within a thrombosed infrarenal abdominal aortic aneurysm measuring 6.7 cm in maximum diameter, previously 5.9 cm. 6. Interval embolization coils in the region of the previously demonstrated endoleak with no residual leak seen. 7. Calcific coronary artery and aortic atherosclerosis. Aortic Atherosclerosis (ICD10-I70.0). Electronically Signed   By: Elspeth Bathe M.D.   On: 01/11/2024 11:52   DG Chest Portable 1 View Result Date: 01/11/2024 CLINICAL DATA:  Cough, shortness of breath, fever and chills EXAM: PORTABLE CHEST 1 VIEW COMPARISON:  11/22/2023 FINDINGS: Stable cardiac enlargement and aortic tortuosity. Prominent central pulmonary arteries. Stable chronic lung disease with bilateral pulmonary interstitial prominence and scattered bilateral pulmonary scarring. No overt pulmonary edema, pleural fluid or airspace consolidation. No pneumothorax. The visualized skeletal structures are unremarkable. IMPRESSION: Stable chronic lung disease with bilateral pulmonary interstitial prominence and scattered bilateral pulmonary scarring. No acute findings. Electronically Signed   By: Marcey Moan M.D.   On: 01/11/2024 09:21     Procedures   Medications Ordered in the ED  acetaminophen  (TYLENOL ) tablet 650 mg (has no administration in time range)    Or  acetaminophen  (TYLENOL ) suppository 650 mg (has no administration in time range)  oxyCODONE  (Oxy IR/ROXICODONE ) immediate release tablet 5 mg (has no administration in time range)  ondansetron  (ZOFRAN ) tablet 4 mg (has no administration in time range)    Or   ondansetron  (ZOFRAN ) injection 4 mg (has no administration in time range)  furosemide  (LASIX ) tablet 20 mg (has no administration in time range)  sacubitril -valsartan  (ENTRESTO ) 24-26 mg per tablet (has no administration in time range)  busPIRone  (BUSPAR ) tablet 7.5 mg (has no administration in time range)  nortriptyline  (PAMELOR ) capsule 10 mg (has no administration in time range)  dapagliflozin  propanediol (FARXIGA ) tablet 10 mg (has no administration in time range)  levothyroxine  (SYNTHROID ) tablet 125 mcg (has no administration in time range)  apixaban  (ELIQUIS ) tablet 5 mg (has no administration in time range)  rOPINIRole  (REQUIP ) tablet 1-2 mg (has no administration in time range)  budesonide  (PULMICORT ) nebulizer solution 0.5 mg (has no administration in time range)  sodium zirconium cyclosilicate (LOKELMA) packet 10 g (has no administration in time range)  lactated ringers  bolus 1,000 mL (0 mLs Intravenous Stopped 01/11/24 1046)  cefTRIAXone (ROCEPHIN) 1 g in sodium chloride  0.9 % 100 mL IVPB (0 g Intravenous Stopped 01/11/24 1045)  azithromycin  (ZITHROMAX ) 500 mg in sodium chloride  0.9 % 250 mL IVPB (0 mg Intravenous Stopped 01/11/24 1223)  iohexol  (OMNIPAQUE ) 300 MG/ML solution 100 mL (100 mLs Intravenous Contrast Given 01/11/24 1049)  sodium zirconium cyclosilicate (LOKELMA) packet 10 g (10 g Oral Given 01/11/24 1055)  acetaminophen  (TYLENOL ) tablet 650 mg (650 mg Oral Given 01/11/24 1244)  oxyCODONE  (Oxy IR/ROXICODONE ) immediate release tablet 5 mg (5 mg Oral Given 01/11/24 1330)  fentaNYL  (SUBLIMAZE ) injection 12.5 mcg (12.5 mcg Intravenous Given 01/11/24 1330)                                     89 year old female with a history of chronic diastolic CHF, CKD, hypothyroidism, paroxysmal atrial fibrillation on Eliquis , rheumatoid arthritis, hypertension, AAA s/p endovascular repair 12/2019, type II endoleak with repair with IR in 10/2022, who presents with concern for chills, cough,  abdominal pain.  DDx includes sepsis, pneumonia, UTI, diverticulitis, viral syndrome,  appendicitis, colitis, infected nephrolithiasis, pyelonephritis.   EKG completed and evaluated interpreted by me shows sinus tachycardia.  History with rigors, temperature 99.3, tachycardia, concerning for and most consistent with infectious etiology of symptoms.  Did order Rocephin, azithromycin  given clinical concern for possible pneumonia versus urinary source.  Given 1 L of IV fluid with low diastolic blood pressures and tachycardia.  Chest x-ray evaluated by me and radiology shows stable chronic lung disease with bilateral pulmonary interstitial prominence and scattered pulmonary scarring without acute findings.    Labs completed personally about interpreted by me show a lactic acid of 1.9.  White blood cell count 12,700.  Mild hyperkalemia 5.4, with normal renal function.  Transaminases and lipase within normal limits.  CT abdomen pelvis ordered given abdominal tenderness on exam show no acute intra-abdominal abnormalities.  She does have diverticulosis without diverticulitis, increased size of left adnexal simple appearing cyst for which radiology recommends a follow-up pelvic ultrasound in 12 months.  A large left inguinal hernia containing fat and small to moderate size right inguinal hernia containing fat.  She has an aortobiiliac stent within the thrombosed infrarenal abdominal aortic aneurysm measuring 6.7 cm in maximum diameter, which was previously 5.9.  Interval embolization coils in the region of the previously demonstrated endoleak with no residual leak seen.  Coronary artery disease noted.  Discussed with Dr. Magda Vascular who reviewed images, no sign of acute process, can continue follow up with Dr. Gretta as outpatient.   On reexamination, she is having left hip pain.  Has had hx of surgery there, was not having pain earlier.  Has pain with ROM.  CT does not show signs of inflammation or fluid  surrounding this area, at this time lower suspicion for septic arthritis.  On reexamination her dyspnea has worsened. She is on 3L of O2, possible diastolic CHF in setting of fluid resuscitation with concern for developing sepsis.  She has continued tachycardia but BP improved. Repeat ECG with artifact, however there are clear pw in areas consistent with sinus rhythm and frequent PVCs. Will add on troponin, proBNP.    Her UA returned and shows no sign of UTI.    Will admit with concern for sepsis, clinical concern for pneumonia with cough, rigors, leukocytosis, tachycardia.        Final diagnoses:  Sepsis with acute organ dysfunction without septic shock, due to unspecified organism, unspecified organ dysfunction type (HCC)  Acute cough  Lower abdominal pain  Leukocytosis, unspecified type    ED Discharge Orders     None          Dreama Longs, MD 01/11/24 1458

## 2024-01-11 NOTE — ED Notes (Signed)
 RN notified of pt's BP readings.

## 2024-01-11 NOTE — Progress Notes (Signed)
     Patient Name: Lisa Mcclure           DOB: 12-04-1932  MRN: [id number]      Admission Date: 01/11/2024  Attending Provider: Vernon Velna SAUNDERS, MD  Primary Diagnosis: Acute hypoxemic respiratory failure (HCC)   Level of care: Progressive   OVERNIGHT EVENT   Elevated troponin in the setting of acute hypoxemic respiratory failure, elevated BNP.  Likely due to demand ischemia.   EKG-sinus tachycardia without acute ST segment elevation. Denies chest pain or any other chest discomfort.  Plan: Trending troponin.    Helena Sardo, DNP, ACNPC- [facility] [place]

## 2024-01-11 NOTE — H&P (Addendum)
 History and Physical    Lisa Mcclure FMW:[id number] DOB: June 09, 1932 DOA: 01/11/2024  PCP: Yolande Toribio MATSU, MD  Patient coming from: Home  I have personally briefly reviewed patient's old medical records in [facility]  Chief Complaint: Chills, cough and abdominal pain  HPI: Lisa Mcclure is a 89 y.o. female with medical history significant of HFpEF, hypertension, paroxysmal A-fib on Eliquis , COPD, OSA not on CPAP, severe aortic stenosis, status post AAA repair in 12/2019, pseudoaneurysm sac repair of AAA on 10/29/2022, left atrial thrombus on Eliquis , hypothyroidism, anxiety, GERD, CKD presented here with complaining of productive cough, chills and abdominal pain.  Patient reports that she has productive cough with clear sputum since couple of weeks.  Last night when she got up to let the dog out then she sat in the living room on the recliner around 1:30 AM she had severe chills and could not stop shaking therefore she called EMS.  She denies fever, shortness of breath, wheezing, chest pain, palpitation, lightheadedness, dizziness, runny nose, sore throat, diarrhea, constipation, melena, poor appetite or unintentional weight loss.  She denies any UTI symptoms however reports that she recently diagnosed with a UTI and treated with oral antibiotics.  C/o of lower abdominal pain that started earlier this morning that resolved in the ER.  Lives alone.  Independent on daily life activities.  Using walker and sometimes wheelchair for ambulation.  No history of tobacco abuse, alcohol  abuse, licit drug use.  ED Course: Upon arrival to ED: Patient afebrile, tachycardic, tachypneic, BP 135/52, requiring 3 to 5 L of oxygen  via nasal cannula.  CBC shows leukocytosis of 12.7, H&H 12.4/41.1, PLT 247.  NA: 134, K: 5.4.  BUN 30, creatinine 0.98, GFR 55.  Lipase: WNL.  Lactic acid: WNL.  COVID flu RSV negative.  UA negative for infection.  Troponin BNP pending.  Chest x-ray shows no pneumonia.CT  abdomen/pelvis shows no acute abnormality.  4.5 cm left adnexal simple appearing cyst unchanged other than slight increase in size from 4.  Centimeter.  Recommend pelvic ultrasound in 12 months.  Large left inguinal hernia containing fat and small to moderate size right inguinal hernia containing fat.  Aortobiiliac stent within thrombosed infrarenal abdominal aortic aneurysm measuring 6.7 and maximum diameter previously 5.9.  EDP discussed with vascular surgery Dr. Ishmael who recommended outpatient follow-up with Dr. Thomas.  Patient was given broad-spectrum antibiotics and [facility] hospitalist consulted for admission.  Review of Systems: As per HPI otherwise negative.    Past Medical History:  Diagnosis Date   Acute renal failure superimposed on stage 3b chronic kidney disease (HCC) 06/15/2021   Anemia    h/o hemorrhoidal bleeding and blood transfusion   Chronic diastolic CHF (congestive heart failure) (HCC)    Depression    Diverticulosis    DOE (dyspnea on exertion)    Esophageal reflux    GERD (gastroesophageal reflux disease)    Hypothyroidism    Insomnia    LBP (low back pain)    OAB (overactive bladder)    Obesity    OSA (obstructive sleep apnea)    Osteoarthritis    Paroxysmal atrial fibrillation (HCC)    Rheumatoid arthritis(714.0)    Shoulder pain, bilateral    Unspecified essential hypertension    Venous insufficiency     Past Surgical History:  Procedure Laterality Date   ABDOMINAL AORTIC ENDOVASCULAR STENT GRAFT N/A 12/20/2019   Procedure: Aortogram including catheter selection of aorta and bilateral iliac arteriogram, Endovascular repair of infrarenal abdominal aortic  aneurysm with bifurcated stent graft (26 mm x 14 x 12 main body, right bell bottom with a 20 mm x 10 cm piece, and left bell bottom with a 16 mm x 12 cm piece) ;  Surgeon: Gretta Lonni PARAS, MD;  Location: [facility] OR;  Service: Vascular;  Laterality: N/A;   APPENDECTOMY  1953   BIOPSY  06/18/2021   Procedure:  BIOPSY;  Surgeon: Saintclair Jasper, MD;  Location: [facility] ENDOSCOPY;  Service: Gastroenterology;;   bladder abduction-1996  1996   breast biopsy Right 1980   CARDIOVERSION N/A 12/24/2019   Procedure: CARDIOVERSION;  Surgeon: Ladona Heinz, MD;  Location: [facility] ENDOSCOPY;  Service: Cardiovascular;  Laterality: N/A;   CARDIOVERSION N/A 04/25/2022   Procedure: CARDIOVERSION;  Surgeon: Ladona Heinz, MD;  Location: [facility] ENDOSCOPY;  Service: Cardiovascular;  Laterality: N/A;   CESAREAN SECTION     1957, 1961, 1964   COSMETIC SURGERY  1996   CYSTOCELE REPAIR     ESOPHAGOGASTRODUODENOSCOPY (EGD) WITH PROPOFOL  N/A 06/18/2021   Procedure: ESOPHAGOGASTRODUODENOSCOPY (EGD) WITH PROPOFOL ;  Surgeon: Saintclair Jasper, MD;  Location: [facility] ENDOSCOPY;  Service: Gastroenterology;  Laterality: N/A;   FLEXIBLE SIGMOIDOSCOPY N/A 04/10/2015   Procedure: FLEXIBLE SIGMOIDOSCOPY;  Surgeon: Elsie Cree, MD;  Location: [facility] ENDOSCOPY;  Service: Endoscopy;  Laterality: N/A;   INTRAMEDULLARY (IM) NAIL INTERTROCHANTERIC Left 02/03/2023   Procedure: LEFT INTRAMEDULLARY (IM) NAIL INTERTROCHANTERIC;  Surgeon: Vernetta Lonni GRADE, MD;  Location: [facility] OR;  Service: Orthopedics;  Laterality: Left;   IR AORTAGRAM ABDOMINAL SERIALOGRAM  10/26/2022   IR EMBO ARTERIAL NOT HEMORR HEMANG INC GUIDE ROADMAPPING  10/24/2022   IR RADIOLOGIST EVAL & MGMT  09/13/2022   IR RADIOLOGIST EVAL & MGMT  11/24/2022   IR US  GUIDE VASC ACCESS LEFT  10/26/2022   IR US  GUIDE VASC ACCESS LEFT  10/26/2022   IR US  GUIDE VASC ACCESS LEFT  10/26/2022   IR US  GUIDE VASC ACCESS LEFT  10/26/2022   knee arthroscopy Right 1996, 2010   KNEE ARTHROSCOPY W/ AUTOGENOUS CARTILAGE IMPLANTATION (ACI) PROCEDURE Left 1994, 1995   REFRACTIVE SURGERY  01/2020   SHOULDER SURGERY  1990   TEE WITHOUT CARDIOVERSION N/A 12/24/2019   Procedure: TRANSESOPHAGEAL ECHOCARDIOGRAM (TEE);  Surgeon: Ladona Heinz, MD;  Location: [facility] ENDOSCOPY;  Service: Cardiovascular;  Laterality: N/A;   TEE WITHOUT CARDIOVERSION N/A  06/23/2022   Procedure: TRANSESOPHAGEAL ECHOCARDIOGRAM (TEE);  Surgeon: Dewane Shiner, DO;  Location: [facility] ENDOSCOPY;  Service: Cardiovascular;  Laterality: N/A;   TOTAL ABDOMINAL HYSTERECTOMY  1972   ULTRASOUND GUIDANCE FOR VASCULAR ACCESS Bilateral 12/20/2019   Procedure: Ultrasound-guided access of bilateral common femoral arteries for delivery of endograft and percutaneous closure;  Surgeon: Gretta Lonni PARAS, MD;  Location: [facility] OR;  Service: Vascular;  Laterality: Bilateral;   VESICOVAGINAL FISTULA CLOSURE W/ TAH     WRIST SURGERY  1967     reports that she quit smoking about 39 years ago. Her smoking use included cigarettes. She started smoking about 49 years ago. She has a 2 pack-year smoking history. She has been exposed to tobacco smoke. She has never used smokeless tobacco. She reports current alcohol  use. She reports that she does not use drugs.  Allergies  Allergen Reactions   Statins Other (See Comments)    Muscle aches and INTERNAL BLEEDING   Atorvastatin Other (See Comments)    Myalgias   Gabapentin Other (See Comments)    Made me loopy   Methocarbamol  Other (See Comments)    Made me loopy    Family History  Problem Relation Age of Onset   Lung cancer Mother    COPD Father    Breast cancer Maternal Aunt    Heart attack Maternal Grandmother 22   Lung cancer Son     Prior to Admission medications   Medication Sig Start Date End Date Taking? Authorizing Provider  ALEVE ARTHRITIS PAIN 1 % GEL Apply 2 g topically 4 (four) times daily as needed (for pain).    [provider]  apixaban  (ELIQUIS ) 5 MG TABS tablet Take 1 tablet (5 mg total) by mouth 2 (two) times daily. 03/13/23   Ladona Heinz, MD  budesonide  (PULMICORT ) 0.5 MG/2ML nebulizer solution Take 2 mLs (0.5 mg total) by nebulization in the morning and at bedtime. Patient taking differently: Take 0.5 mg by nebulization daily. 03/22/22   Hope Almarie ORN, NP  busPIRone  (BUSPAR ) 7.5 MG tablet Take 7.5 mg  by mouth See admin instructions. Take 7.5 mg by mouth at bedtime and an additional 7.5 mg once a day as needed for anxiety    [provider]  Cholecalciferol  (VITAMIN D3) 125 MCG (5000 UT) CAPS Take 1 capsule by mouth daily.    [provider]  dapagliflozin  propanediol (FARXIGA ) 10 MG TABS tablet Take 1 tablet (10 mg total) by mouth daily before breakfast. 12/01/23   Ladona Heinz, MD  diclofenac  sodium (VOLTAREN ) 1 % GEL Apply 2.25 g topically 3 (three) times daily as needed (for pain). 10/23/18   [provider]  furosemide  (LASIX ) 20 MG tablet Take 1 tablet (20 mg total) by mouth daily. 09/25/23   Ladona Heinz, MD  latanoprost  (XALATAN ) 0.005 % ophthalmic solution Place 1 drop into both eyes at bedtime.    [provider]  levothyroxine  (SYNTHROID ) 125 MCG tablet Take 125 mcg by mouth daily before breakfast.     [provider]  melatonin 3 MG TABS tablet Take 3 mg by mouth at bedtime.    [provider]  methocarbamol  (ROBAXIN ) 500 MG tablet Take 500 mg by mouth every 6 (six) hours as needed. 06/26/23   [provider]  Multiple Vitamins-Minerals (PRESERVISION AREDS 2 PO) Take 1 capsule by mouth in the morning and at bedtime.    [provider]  nortriptyline  (PAMELOR ) 10 MG capsule Take 10 mg by mouth at bedtime. 08/31/22   [provider]  polyethylene glycol (MIRALAX  / GLYCOLAX ) 17 g packet Take 17 g by mouth 2 (two) times daily. Decrease to daily dosing if developing diarrhea/watery stools Patient taking differently: Take 17 g by mouth daily as needed. Decrease to daily dosing if developing diarrhea/watery stools 02/07/23   Briana Elgin LABOR, MD  rOPINIRole  (REQUIP ) 1 MG tablet Take 1-2 mg by mouth See admin instructions. Take 2mg  by mouth at 1 PM and 1mg  at 9 PM 06/19/15   [provider]  sacubitril -valsartan  (ENTRESTO ) 24-26 MG Take 1 tablet by mouth 2 (two) times daily. 02/06/23   Ladona Heinz, MD  spironolactone   (ALDACTONE ) 25 MG tablet Take 25 mg by mouth daily. 11/24/22   [provider]  SYSTANE COMPLETE PF 0.6 % SOLN Place 1 drop into both eyes 4 (four) times daily as needed (for dryness).    [provider]    Physical Exam: Vitals:   01/11/24 1215 01/11/24 1230 01/11/24 1245 01/11/24 1300  BP:    126/68  Pulse:  (!) 118 (!) 117 (!) 118  Resp: (!) 21 (!) 26 (!) 21 (!) 23  Temp:    98.5 F (36.9 C)  TempSrc:      SpO2:  100% 100% 100%    Constitutional: NAD, calm, comfortable, on nasal cannula, communicating well Eyes: PERRL, lids and conjunctivae normal ENMT: Mucous membranes are moist. Posterior pharynx clear of any exudate or lesions.Normal dentition.  Neck: normal, supple, no masses, no thyromegaly Respiratory: clear to auscultation bilaterally, no wheezing, no crackles. Normal respiratory effort. No accessory muscle use.  Cardiovascular: Systolic murmur noted.  No rubs or gallop.  Bilateral trace pitting edema positive Abdomen: no tenderness, no masses palpated. No hepatosplenomegaly. Bowel sounds positive.  Musculoskeletal: no clubbing / cyanosis. No joint deformity upper and lower extremities. Good ROM, no contractures. Normal muscle tone.  Skin: no rashes, lesions, ulcers. No induration Neurologic: CN 2-12 grossly intact. Sensation intact, DTR normal. Strength 5/5 in all 4.  Psychiatric: Normal judgment and insight. Alert and oriented x 3. Normal mood.    Labs on Admission: I have personally reviewed following labs and imaging studies  CBC: Recent Labs  Lab 01/11/24 0834  WBC 12.7*  NEUTROABS 11.3*  HGB 12.4  HCT 41.1  MCV 98.6  PLT 247   Basic Metabolic Panel: Recent Labs  Lab 01/11/24 0834  NA 134*  K 5.4*  CL 103  CO2 21*  GLUCOSE 99  BUN 30*  CREATININE 0.98  CALCIUM 8.0*   GFR: CrCl cannot be calculated (Unknown ideal weight.). Liver Function Tests: Recent Labs  Lab 01/11/24 0834  AST 17  ALT 7  ALKPHOS 101  BILITOT 0.5  PROT  7.3  ALBUMIN  3.9   Recent Labs  Lab 01/11/24 0834  LIPASE 15   No results for input(s): AMMONIA in the last 168 hours. Coagulation Profile: No results for input(s): INR, PROTIME in the last 168 hours. Cardiac Enzymes: No results for input(s): CKTOTAL, CKMB, CKMBINDEX, TROPONINI in the last 168 hours. BNP (last 3 results) Recent Labs    01/25/23 1620  PROBNP 1,478*   HbA1C: No results for input(s): HGBA1C in the last 72 hours. CBG: No results for input(s): GLUCAP in the last 168 hours. Lipid Profile: No results for input(s): CHOL, HDL, LDLCALC, TRIG, CHOLHDL, LDLDIRECT in the last 72 hours. Thyroid  Function Tests: No results for input(s): TSH, T4TOTAL, FREET4, T3FREE, THYROIDAB in the last 72 hours. Anemia Panel: No results for input(s): VITAMINB12, FOLATE, FERRITIN, TIBC, IRON , RETICCTPCT in the last 72 hours. Urine analysis:    Component Value Date/Time   COLORURINE YELLOW 01/11/2024 1230   APPEARANCEUR CLEAR 01/11/2024 1230   LABSPEC 1.037 (H) 01/11/2024 1230   PHURINE 7.0 01/11/2024 1230   GLUCOSEU >=500 (A) 01/11/2024 1230   HGBUR NEGATIVE 01/11/2024 1230   BILIRUBINUR NEGATIVE 01/11/2024 1230   KETONESUR NEGATIVE 01/11/2024 1230   PROTEINUR NEGATIVE 01/11/2024 1230   UROBILINOGEN 0.2 01/06/2009 0808   NITRITE NEGATIVE 01/11/2024 1230   LEUKOCYTESUR NEGATIVE 01/11/2024 1230    Radiological Exams on Admission: CT ABDOMEN PELVIS W CONTRAST Result Date: 01/11/2024 CLINICAL DATA:  Left lower quadrant abdominal pain, chills and fever since this morning. Recently treated for a urinary tract infection. Pelvic pain. Shortness of breath. EXAM: CT ABDOMEN AND PELVIS WITH CONTRAST TECHNIQUE: Multidetector CT imaging of the abdomen and pelvis was performed using the standard protocol following bolus administration of intravenous contrast. RADIATION DOSE REDUCTION: This exam was performed according to the departmental  dose-optimization program which includes automated exposure control, adjustment of the mA and/or kV according to patient size and/or use of iterative reconstruction technique. CONTRAST:  OMNIPAQUE  IOHEXOL  300 MG/ML  SOLN COMPARISON:  07/02/2022 FINDINGS: Lower chest: Atheromatous calcifications, including the coronary arteries and aorta. Dense mitral valve annulus calcifications. Mildly enlarged heart. No pericardial effusion. Mild-to-moderate dependent atelectasis at both lung bases. Hepatobiliary: No focal liver abnormality is seen. No gallstones, gallbladder wall thickening, or biliary dilatation. Pancreas: Moderate diffuse pancreatic atrophy. Spleen: Normal in size without focal abnormality. Adrenals/Urinary Tract: Adrenal glands are unremarkable. Kidneys are normal, without renal calculi, focal lesion, or hydronephrosis. Bladder is unremarkable. There is some air in the urinary bladder, compatible with recent catheterization. Stomach/Bowel: Scattered colonic diverticula, most numerous in the right colon and sigmoid colon, without evidence of diverticulitis. Surgically absent appendix. Unremarkable stomach and small bowel. Vascular/Lymphatic: Extensive atheromatous arterial calcifications. Aorto bi-iliac stent within a thrombosed infrarenal abdominal aortic aneurysm measuring 6.7 cm in maximum diameter. There are multiple interval embolization coils in the region of the previously demonstrated endoleak. There is a small amount of developing calcification within the thrombus on the right on image number 43/11, measuring 209 Hounsfield units in density and a small amount of developing curvilinear calcification within the thrombus posteriorly on the left on image number 46/11. No enlarged lymph nodes. Reproductive: A 4.5 cm left adnexal simple appearing cyst measuring 7 Hounsfield units in density on image number 68/11 is unchanged other than a slight increase in size from 4.1 cm in maximum diameter  previously. Surgically absent uterus and ovaries. Other: Large left inguinal hernia containing fat and small to moderate-sized right inguinal hernia containing fat. Musculoskeletal: Left hip fixation hardware. Lower thoracic spine degenerative changes. IMPRESSION: 1. No acute abnormality. 2. Colonic diverticulosis without evidence of diverticulitis. 3. 4.5 cm left adnexal simple appearing cyst, unchanged other than a slight increase in size from 4.1 cm in maximum diameter previously. Recommend follow-up pelvic ultrasound in 12 months. 4. Large left inguinal hernia containing fat and small to moderate-sized right inguinal hernia containing fat. 5. Aorto bi-iliac stent within a thrombosed infrarenal abdominal aortic aneurysm measuring 6.7 cm in maximum diameter, previously 5.9 cm. 6. Interval embolization coils in the region of the previously demonstrated endoleak with no residual leak seen. 7. Calcific coronary artery and aortic atherosclerosis. Aortic Atherosclerosis (ICD10-I70.0). Electronically Signed   By: Elspeth Bathe M.D.   On: 01/11/2024 11:52   DG Chest Portable 1 View Result Date: 01/11/2024 CLINICAL DATA:  Cough, shortness of breath, fever and chills EXAM: PORTABLE CHEST 1 VIEW COMPARISON:  11/22/2023 FINDINGS: Stable cardiac enlargement and aortic tortuosity. Prominent central pulmonary arteries. Stable chronic lung disease with bilateral pulmonary interstitial prominence and scattered bilateral pulmonary scarring. No overt pulmonary edema, pleural fluid or airspace consolidation. No pneumothorax. The visualized skeletal structures are unremarkable. IMPRESSION: Stable chronic lung disease with bilateral pulmonary interstitial prominence and scattered bilateral pulmonary scarring. No acute findings. Electronically Signed   By: Marcey Moan M.D.   On: 01/11/2024 09:21    EKG: Independently reviewed.  Sinus tachycardia.  No acute ST-T wave changes noted.  Assessment/Plan  Acute hypoxemic  respiratory failure: -Could be in the setting of acute on chronic diastolic CHF.  Currently on 2 L of oxygen  via nasal cannula. -Chest x-ray and CT chest negative for any acute findings.  Received Rocephin and azithromycin  in the ED. - Order transthoracic echocardiogram -BNP elevated. COVID flu RSV negative.  She is afebrile however has leukocytosis.  UA negative. -Will try to wean off of oxygen  as tolerated. - Will continue home medication Farxiga , increase home Lasix  20 mg daily to 40 mg daily.  Hold on Aldactone  due to hyperkalemia and Entresto   due to low blood pressure  severe aortic stenosis Status post AAA repair and AAA pseudoaneurysm repair -Followed by cardiology and vascular surgery outpatient. -CT abdomen shows increasing size of AAA from 5.9 to 6.7 cm.  EDP discussed with on-call vascular surgery Dr. Ishmael who recommended outpatient follow-up with Dr. Thomas.  Paroxysmal A-fib: -Slightly tachycardic.  Continue Eliquis .  Monitor closely on telemetry.  Left ventricular thrombus: On Eliquis   Hypothyroidism: Check TSH.  Continue current dose of levothyroxine   OSA: -Not on CPAP. has oxygen  at home however she does not use it regularly  Hyperkalemia: Lokelma x 1 given - Check magnesium .  Repeat BMP tomorrow.  Hold on Aldactone .  Hyponatremia: Mild.  Will continue to monitor  CKD stage IIIa: Stable.  Continue to monitor  COPD: - No wheezing noted on exam.  Will continue home inhalers.  Anxiety: Continue BuSpar   Left adnexal cyst: - Per CT report slightly increase in size from 4.1 cm to 4.5 cm.  Radiology recommended follow-up pelvic ultrasound in 12 months  Large left inguinal hernia: - Noted on CT abdomen.  No sign of obstruction  Low back pain: - Patient tells me that she takes hydrocodone  at home.  I do not see in her medication list. - Oxycodone  as needed ordered. - Looks like she is on nortriptyline  at home.  Will continue same  DVT prophylaxis: Eliquis  Code  Status: DNR-confirmed with the patient Family Communication: None present at bedside.  Plan of care discussed with patient in length and he verbalized understanding and agreed with it. Disposition Plan: To be determined Consults called: None Admission status: Inpatient   Velna JONELLE Skeeter MD [facility]  If 7PM-7AM, please contact night-coverage [email or web address]  01/11/2024, 2:03 PM

## 2024-01-11 NOTE — ED Notes (Signed)
 EKG repeated per Dr Dreama

## 2024-01-12 ENCOUNTER — Inpatient Hospital Stay (HOSPITAL_COMMUNITY)

## 2024-01-12 ENCOUNTER — Encounter (HOSPITAL_COMMUNITY): Payer: Self-pay | Admitting: Internal Medicine

## 2024-01-12 DIAGNOSIS — I5031 Acute diastolic (congestive) heart failure: Secondary | ICD-10-CM | POA: Diagnosis not present

## 2024-01-12 DIAGNOSIS — N1831 Chronic kidney disease, stage 3a: Secondary | ICD-10-CM

## 2024-01-12 DIAGNOSIS — I480 Paroxysmal atrial fibrillation: Secondary | ICD-10-CM

## 2024-01-12 DIAGNOSIS — J9601 Acute respiratory failure with hypoxia: Secondary | ICD-10-CM | POA: Diagnosis not present

## 2024-01-12 DIAGNOSIS — R7989 Other specified abnormal findings of blood chemistry: Secondary | ICD-10-CM

## 2024-01-12 DIAGNOSIS — I5032 Chronic diastolic (congestive) heart failure: Secondary | ICD-10-CM

## 2024-01-12 DIAGNOSIS — I350 Nonrheumatic aortic (valve) stenosis: Secondary | ICD-10-CM

## 2024-01-12 LAB — ECHOCARDIOGRAM COMPLETE
AR max vel: 0.75 cm2
AV Area VTI: 0.88 cm2
AV Area mean vel: 0.69 cm2
AV Mean grad: 30 mmHg
AV Peak grad: 60.8 mmHg
Ao pk vel: 3.9 m/s
Area-P 1/2: 3.3 cm2
Calc EF: 62.8 %
Height: 63 in
MV M vel: 3.49 m/s
MV Peak grad: 48.7 mmHg
MV VTI: 1.76 cm2
P 1/2 time: 377 ms
S' Lateral: 3.5 cm
Single Plane A2C EF: 68.5 %
Single Plane A4C EF: 56.3 %
Weight: 3139.35 [oz_av]

## 2024-01-12 LAB — CBC
HCT: 39 % (ref 36.0–46.0)
Hemoglobin: 12 g/dL (ref 12.0–15.0)
MCH: 29.9 pg (ref 26.0–34.0)
MCHC: 30.8 g/dL (ref 30.0–36.0)
MCV: 97.3 fL (ref 80.0–100.0)
Platelets: 200 K/uL (ref 150–400)
RBC: 4.01 MIL/uL (ref 3.87–5.11)
RDW: 13.7 % (ref 11.5–15.5)
WBC: 9.8 K/uL (ref 4.0–10.5)
nRBC: 0 % (ref 0.0–0.2)

## 2024-01-12 LAB — PROCALCITONIN: Procalcitonin: 0.97 ng/mL

## 2024-01-12 LAB — BASIC METABOLIC PANEL WITH GFR
Anion gap: 12 (ref 5–15)
BUN: 26 mg/dL — ABNORMAL HIGH (ref 8–23)
CO2: 20 mmol/L — ABNORMAL LOW (ref 22–32)
Calcium: 7.6 mg/dL — ABNORMAL LOW (ref 8.9–10.3)
Chloride: 101 mmol/L (ref 98–111)
Creatinine, Ser: 1.15 mg/dL — ABNORMAL HIGH (ref 0.44–1.00)
GFR, Estimated: 46 mL/min — ABNORMAL LOW (ref >60)
Glucose, Bld: 83 mg/dL (ref 70–99)
Potassium: 5.2 mmol/L — ABNORMAL HIGH (ref 3.5–5.1)
Sodium: 133 mmol/L — ABNORMAL LOW (ref 135–145)

## 2024-01-12 LAB — RESPIRATORY PANEL BY PCR

## 2024-01-12 LAB — URINE CULTURE

## 2024-01-12 LAB — TROPONIN T, HIGH SENSITIVITY
Troponin T High Sensitivity: 120 ng/L (ref 0–19)
Troponin T High Sensitivity: 101 ng/L (ref 0–19)

## 2024-01-12 LAB — LACTIC ACID, PLASMA: Lactic Acid, Venous: 1.5 mmol/L (ref 0.5–1.9)

## 2024-01-12 MED ORDER — ROPINIROLE HCL 1 MG PO TABS
1.0000 mg | ORAL_TABLET | Freq: Three times a day (TID) | ORAL | Status: DC
  Administered 2024-01-12 – 2024-01-16 (×13): 1 mg via ORAL
  Filled 2024-01-12 (×13): qty 1

## 2024-01-12 MED ORDER — POLYVINYL ALCOHOL 1.4 % OP SOLN
1.0000 [drp] | Freq: Four times a day (QID) | OPHTHALMIC | Status: DC | PRN
  Administered 2024-01-13 – 2024-01-14 (×2): 1 [drp] via OPHTHALMIC
  Filled 2024-01-12: qty 15

## 2024-01-12 MED ORDER — DOXYCYCLINE HYCLATE 100 MG PO TABS
100.0000 mg | ORAL_TABLET | Freq: Two times a day (BID) | ORAL | Status: DC
  Administered 2024-01-12 – 2024-01-16 (×9): 100 mg via ORAL
  Filled 2024-01-12 (×9): qty 1

## 2024-01-12 MED ORDER — CARMEX CLASSIC LIP BALM EX OINT
1.0000 | TOPICAL_OINTMENT | CUTANEOUS | Status: DC | PRN
  Filled 2024-01-12: qty 10

## 2024-01-12 MED ORDER — METHOCARBAMOL 500 MG PO TABS
500.0000 mg | ORAL_TABLET | Freq: Four times a day (QID) | ORAL | Status: DC | PRN
  Administered 2024-01-13 – 2024-01-16 (×2): 500 mg via ORAL
  Filled 2024-01-12 (×2): qty 1

## 2024-01-12 MED ORDER — VITAMIN D 25 MCG (1000 UNIT) PO TABS
5000.0000 [IU] | ORAL_TABLET | Freq: Every day | ORAL | Status: DC
  Administered 2024-01-12 – 2024-01-16 (×5): 5000 [IU] via ORAL
  Filled 2024-01-12 (×5): qty 5

## 2024-01-12 MED ORDER — SODIUM CHLORIDE 0.9 % IV BOLUS
250.0000 mL | Freq: Once | INTRAVENOUS | Status: AC
  Administered 2024-01-12: 250 mL via INTRAVENOUS

## 2024-01-12 MED ORDER — SODIUM CHLORIDE 0.9 % IV SOLN
1.0000 g | Freq: Every day | INTRAVENOUS | Status: AC
  Administered 2024-01-12 – 2024-01-14 (×3): 1 g via INTRAVENOUS
  Filled 2024-01-12 (×3): qty 10

## 2024-01-12 MED ORDER — DICLOFENAC SODIUM 1 % EX GEL
2.0000 g | Freq: Three times a day (TID) | CUTANEOUS | Status: DC | PRN
Start: 2024-01-12 — End: 2024-01-16
  Administered 2024-01-13 – 2024-01-16 (×3): 2 g via TOPICAL
  Filled 2024-01-12: qty 100

## 2024-01-12 MED ORDER — LATANOPROST 0.005 % OP SOLN
1.0000 [drp] | Freq: Every day | OPHTHALMIC | Status: DC
  Administered 2024-01-12 – 2024-01-15 (×4): 1 [drp] via OPHTHALMIC
  Filled 2024-01-12: qty 2.5

## 2024-01-12 MED ORDER — POLYETHYLENE GLYCOL 3350 17 G PO PACK
17.0000 g | PACK | Freq: Every day | ORAL | Status: DC | PRN
Start: 2024-01-12 — End: 2024-01-16
  Administered 2024-01-13: 17 g via ORAL
  Filled 2024-01-12 (×2): qty 1

## 2024-01-12 MED ORDER — SODIUM ZIRCONIUM CYCLOSILICATE 10 G PO PACK
10.0000 g | PACK | Freq: Once | ORAL | Status: AC
  Administered 2024-01-12: 10 g via ORAL
  Filled 2024-01-12: qty 1

## 2024-01-12 MED ORDER — MELATONIN 3 MG PO TABS
3.0000 mg | ORAL_TABLET | Freq: Every day | ORAL | Status: DC
  Administered 2024-01-12 – 2024-01-15 (×4): 3 mg via ORAL
  Filled 2024-01-12 (×4): qty 1

## 2024-01-12 NOTE — Progress Notes (Signed)
 PT Cancellation Note  Patient Details Name: Lisa Mcclure MRN: [id number] DOB: 1933-01-12   Cancelled Treatment:    Reason Eval/Treat Not Completed: Patient at procedure or test/unavailable (pt currently having cardiac ultrasound in room. Will follow.)   Sylvan Delon Copp PT 01/12/2024  Acute Rehabilitation Services  Office [phone]

## 2024-01-12 NOTE — TOC Initial Note (Signed)
 Transition of Care [facility]) - Initial/Assessment Note    Patient Details  Name: Lisa Mcclure MRN: [id number] Date of Birth: Jul 23, 1932  Transition of Care [facility]) CM/SW Contact:    Sonda Manuella Quill, RN Phone Number: 01/12/2024, 4:42 PM  Clinical Narrative:                 Recc HHPT/OT; spoke w/ pt in room; pt said she lives at home; she plans to return at d/c; pt identified POC dtr Lisa Mcclure [phone]); her dtr will provide transportation; pt verified insurance/PCP; she denied SDOH risks; pt has cane, walker, wheelchair, BSC, shower chair; she does not have HH services or home oxygen ; pt agreed to receive recc services; her agency preference is [facility] [facility]; spoke w/ Darleene at agency; he said agency can provide services; pt notified; agency contact info placed in follow up provider section of d/c instructions; IP CM is following.  Expected Discharge Plan: Home w Home Health Services Barriers to Discharge: Continued Medical Work up   Patient Goals and CMS Choice Patient states their goals for this hospitalization and ongoing recovery are:: home CMS Medicare.gov Compare Post Acute Care list provided to:: Patient        Expected Discharge Plan and Services   Discharge Planning Services: CM Consult   Living arrangements for the past 2 months: Single Family Home                 DME Arranged: N/A DME Agency: NA       HH Arranged: PT, OT HH Agency: [facility] Date [facility] Agency Contacted: 01/12/24 Time HH Agency Contacted: 1641 Representative spoke with at [facility] Agency: Darleene  Prior Living Arrangements/Services Living arrangements for the past 2 months: Single Family Home Lives with:: Self Patient language and need for interpreter reviewed:: Yes Do you feel safe going back to the place where you live?: Yes      Need for Family Participation in Patient Care: Yes (Comment) Care giver support system in place?: Yes (comment) Current home services: DME (cane, walker,  wheelchair, BSC, shower chair) Criminal Activity/Legal Involvement Pertinent to Current Situation/Hospitalization: No - Comment as needed  Activities of Daily Living   ADL Screening (condition at time of admission) Independently performs ADLs?: No Does the patient have a NEW difficulty with bathing/dressing/toileting/self-feeding that is expected to last >3 days?: Yes (Initiates electronic notice to provider for possible OT consult) Does the patient have a NEW difficulty with getting in/out of bed, walking, or climbing stairs that is expected to last >3 days?: Yes (Initiates electronic notice to provider for possible PT consult) Does the patient have a NEW difficulty with communication that is expected to last >3 days?: No Is the patient deaf or have difficulty hearing?: No Does the patient have difficulty seeing, even when wearing glasses/contacts?: No Does the patient have difficulty concentrating, remembering, or making decisions?: No  Permission Sought/Granted Permission sought to share information with : Case Manager Permission granted to share information with : Yes, Verbal Permission Granted  Share Information with NAME: Case Manager     Permission granted to share info w Relationship: Lisa Mcclure (dtr) [phone]     Emotional Assessment   Attitude/Demeanor/Rapport: Gracious Affect (typically observed): Accepting Orientation: : Oriented to Self, Oriented to Place, Oriented to  Time, Oriented to Situation Alcohol  / Substance Use: Not Applicable Psych Involvement: No (comment)  Admission diagnosis:  Lower abdominal pain [R10.30] Acute hypoxemic respiratory failure (HCC) [J96.01] Leukocytosis, unspecified type [D72.829] Acute cough [R05.1]  Sepsis with acute organ dysfunction without septic shock, due to unspecified organism, unspecified organ dysfunction type (HCC) [A41.9, R65.20] Patient Active Problem List   Diagnosis Date Noted   Osteoporosis, postmenopausal  12/19/2023   Closed comminuted intertrochanteric fracture of proximal end of left femur (HCC) 02/02/2023   S/P AAA repair 02/02/2023   Left atrial thrombus 02/02/2023   Closed left hip fracture (HCC) 02/02/2023   DNR (do not resuscitate) 01/25/2023   CHF exacerbation (HCC) 11/29/2022   Acute exacerbation of CHF (congestive heart failure) (HCC) 06/19/2022   Chronic diastolic CHF (congestive heart failure) (HCC) 04/25/2022   Chronic renal failure, stage 3b (HCC) 04/25/2022   Class 2 obesity 04/25/2022   Persistent atrial fibrillation (HCC) 04/22/2022   Chronic cough 03/28/2022   Acute hypoxemic respiratory failure (HCC) 06/15/2021   Paroxysmal atrial fibrillation (HCC) 06/15/2021   Acute renal failure with acute tubular necrosis superimposed on stage 3b chronic kidney disease (HCC) 06/15/2021   Generalized weakness 06/15/2021   Atrial fibrillation with RVR (HCC)    Long term (current) use of anticoagulants    Long term current use of antiarrhythmic drug    AAA (abdominal aortic aneurysm) without rupture 12/20/2019   AAA (abdominal aortic aneurysm) 12/20/2019   Intolerance of continuous positive airway pressure (CPAP) ventilation 11/26/2019   Severe hypoxemia 08/21/2019   Severe obstructive sleep apnea 07/29/2019   COPD (chronic obstructive pulmonary disease) (HCC) 07/29/2019   RLS (restless legs syndrome) 07/29/2019   Dehydration with hyponatremia 09/13/2018   Bradycardia 09/13/2018   Glaucoma 09/13/2018   OA (osteoarthritis) of shoulder 05/16/2018   Incomplete emptying of bladder 05/17/2017   Labial adhesions 05/17/2017   Mixed stress and urge urinary incontinence 05/17/2017   Urethral sphincter deficiency, intrinsic (ISD) 05/17/2017   Severe aortic stenosis 01/15/2016   Bilateral carotid bruits 05/11/2015   Hyponatremia 04/19/2015   Symptomatic anemia 04/19/2015   Panic attack 04/19/2015   Constipation 04/19/2015   Hyperkalemia 04/17/2015   Influenza A 04/16/2015   Proximal  humerus fracture    Left patella fracture    Benign essential HTN    Esophageal reflux    Chronic heart failure with preserved ejection fraction (HFpEF) (HCC)    Hypothyroidism    Lower GI bleed 04/08/2015   Hematochezia    Rectal bleeding 04/03/2015   Patellar fracture 03/06/2015   Left humeral fracture 11/04/2013   Rib fracture 11/04/2013   Hypothyroid 11/04/2013   Essential hypertension 11/04/2013   Right shoulder pain 11/04/2013   OSA (obstructive sleep apnea) 11/13/2008   BOOP (bronchiolitis obliterans with organizing pneumonia) (HCC) 08/25/2008   PCP:  Yolande Toribio MATSU, MD Pharmacy:   [facility] [place], [place] - [address] [place] [address] Phone: [phone] Fax: [phone]  [facility] - [place], [place] - [address] [place] [address] Phone: [phone] Fax: [phone]  [facility] - [place], [place] - [address] [place] [address] Phone: [phone] Fax: [phone]     Social Drivers of Health (SDOH) Social History: SDOH Screenings   Food Insecurity: No Food Insecurity (01/12/2024)  Housing: Unknown (01/12/2024)  Transportation Needs: No Transportation Needs (01/12/2024)  Utilities: Not At Risk (01/12/2024)  Alcohol  Screen: Low Risk  (08/11/2021)  Depression (PHQ2-9): Medium Risk (08/11/2021)  Financial Resource Strain: Low Risk  (08/11/2021)  Physical Activity: Inactive (08/11/2021)  Social Connections: Moderately Integrated (01/11/2024)  Stress: No Stress Concern Present (08/11/2021)  Tobacco  Use: Medium Risk (01/11/2024)   SDOH Interventions: Food Insecurity Interventions: Intervention Not Indicated, Inpatient TOC Housing Interventions: Intervention Not Indicated, Inpatient TOC Transportation Interventions: Intervention Not Indicated, Inpatient TOC Utilities Interventions: Intervention Not Indicated, Inpatient TOC   Readmission Risk Interventions     01/12/2024    4:39 PM 12/02/2022   10:45 AM 06/21/2022    4:57 PM  Readmission Risk Prevention Plan  Transportation Screening Complete Complete Complete  PCP or Specialist Appt within 3-5 Days Complete Complete Complete  HRI or Home Care Consult Complete Complete Complete  Social Work Consult for Recovery Care Planning/Counseling Complete Complete   Palliative Care Screening Not Applicable Not Applicable Not Applicable  Medication Review Oceanographer) Complete Referral to Pharmacy Complete

## 2024-01-12 NOTE — Evaluation (Signed)
 Physical Therapy Evaluation Patient Details Name: Lisa Mcclure MRN: [id number] DOB: [birth year] Today's Date: 01/12/2024  History of Present Illness  89 y.o. female presented here with complaining of productive cough, chills and abdominal pain. Dx of acute hypoxemic respiratory failure, CHF.  Pt with medical history significant of HFpEF, hypertension, paroxysmal A-fib on Eliquis , COPD, OSA not on CPAP, severe aortic stenosis, status post AAA repair in 12/2019, pseudoaneurysm sac repair of AAA on 10/29/2022, left atrial thrombus on Eliquis , hypothyroidism, anxiety, GERD, CKD  Clinical Impression  Pt admitted with above diagnosis. Pt reports she fractures her L femur a year ago and since then has only walked very short distances with a rollator and primarily uses a wheelchair. She denies falls in the past 6 months. Pt was seated in recliner at start of session. Pt performed sit to stand with supervision, she was able to stand for BP reading for orthostatic vitals, but then was too fatigued/SOB to attempt ambulation. Pt returned to sitting and performed seated BLE exercises for strengthening.  Pt currently with functional limitations due to the deficits listed below (see PT Problem List). Pt will benefit from acute skilled PT to increase their independence and safety with mobility to allow discharge.           If plan is discharge home, recommend the following: A little help with walking and/or transfers;A little help with bathing/dressing/bathroom;Assistance with cooking/housework;Assist for transportation;Help with stairs or ramp for entrance   Can travel by private vehicle        Equipment Recommendations None recommended by PT  Recommendations for Other Services       Functional Status Assessment       Precautions / Restrictions Precautions Precautions: Fall Recall of Precautions/Restrictions: Intact Precaution/Restrictions Comments: no falls in past 6 months, fell a year ago and  sustained L femur fx Restrictions Weight Bearing Restrictions Per Provider Order: No      Mobility  Bed Mobility               General bed mobility comments: up in recliner    Transfers Overall transfer level: Needs assistance Equipment used: Rolling walker (2 wheels) Transfers: Sit to/from Stand Sit to Stand: Contact guard assist           General transfer comment: VCs for hand placement, pt stood with RW for ~90 seconds for orthostatic vitals reading, then was fatigued/SOB and needed to sit, pt stated she wasn't able to tolerate walking    Ambulation/Gait                  Stairs            Wheelchair Mobility     Tilt Bed    Modified Rankin (Stroke Patients Only)       Balance Overall balance assessment: Modified Independent                                           Pertinent Vitals/Pain Pain Assessment Pain Assessment: 0-10 Pain Score: 4  Pain Location: back pain Pain Descriptors / Indicators: Aching Pain Intervention(s): Limited activity within patient's tolerance, Monitored during session, Repositioned, Heat applied    Home Living Family/patient expects to be discharged to:: Private residence Living Arrangements: Alone Available Help at Discharge: Personal care attendant;Available PRN/intermittently Type of Home: House Home Access: Level entry       Home Layout: One  level Home Equipment: Agricultural consultant (2 wheels);Rollator (4 wheels);Cane - single point;Grab bars - tub/shower;Adaptive equipment;Toilet riser;Wheelchair - power Additional Comments: aides come 9-12am and 5-8pm 7 days/week to assist with meals, bathing, dressing    Prior Function Prior Level of Function : Needs assist             Mobility Comments: ambulatory with rollator, reports she only walks short distances due to lack of shoulder strength (my shoulders are shot and the ortho Drs can't do anything). Mostly uses WC ADLs Comments:  assist from aides     Extremity/Trunk Assessment   Upper Extremity Assessment Upper Extremity Assessment: Defer to OT evaluation;Generalized weakness    Lower Extremity Assessment Lower Extremity Assessment: Overall WFL for tasks assessed;RLE deficits/detail;LLE deficits/detail RLE Deficits / Details: +4/5 knee ext RLE Sensation: WNL;history of peripheral neuropathy LLE Deficits / Details: +4/5 knee ext LLE Sensation: WNL;history of peripheral neuropathy    Cervical / Trunk Assessment Cervical / Trunk Assessment: Normal  Communication   Communication Communication: No apparent difficulties    Cognition Arousal: Alert Behavior During Therapy: WFL for tasks assessed/performed   PT - Cognitive impairments: No apparent impairments                         Following commands: Intact       Cueing       General Comments      Exercises General Exercises - Lower Extremity Ankle Circles/Pumps: AROM, 10 reps, Both, Supine Long Arc Quad: AROM, Both, 10 reps, Seated Heel Slides: AROM, Both, 10 reps, Supine   Assessment/Plan    PT Assessment Patient needs continued PT services  PT Problem List Decreased activity tolerance;Cardiopulmonary status limiting activity;Decreased mobility       PT Treatment Interventions Gait training;Therapeutic exercise;Patient/family education;Therapeutic activities;Functional mobility training    PT Goals (Current goals can be found in the Care Plan section)  Acute Rehab PT Goals Patient Stated Goal: return home PT Goal Formulation: With patient Time For Goal Achievement: 01/26/24 Potential to Achieve Goals: Good    Frequency Min 3X/week     Co-evaluation               AM-PAC PT 6 Clicks Mobility  Outcome Measure Help needed turning from your back to your side while in a flat bed without using bedrails?: A Little Help needed moving from lying on your back to sitting on the side of a flat bed without using  bedrails?: A Little Help needed moving to and from a bed to a chair (including a wheelchair)?: A Little Help needed standing up from a chair using your arms (e.g., wheelchair or bedside chair)?: None Help needed to walk in hospital room?: A Lot Help needed climbing 3-5 steps with a railing? : A Lot 6 Click Score: 17    End of Session Equipment Utilized During Treatment: Gait belt Activity Tolerance: Patient tolerated treatment well Patient left: in chair;with chair alarm set;with call bell/phone within reach Nurse Communication: Mobility status PT Visit Diagnosis: Difficulty in walking, not elsewhere classified (R26.2)    Time: 8694-8674 PT Time Calculation (min) (ACUTE ONLY): 20 min   Charges:   PT Evaluation $PT Eval Moderate Complexity: 1 Mod   PT General Charges $$ ACUTE PT VISIT: 1 Visit         Sylvan Delon Copp PT 01/12/2024  Acute Rehabilitation Services  Office [phone]

## 2024-01-12 NOTE — Progress Notes (Signed)
 OT Cancellation Note  Patient Details Name: ALEXANDRINA FIORINI MRN: [id number] DOB: 03/08/1933   Cancelled Treatment:    Reason Eval/Treat Not Completed: Patient at procedure or test/ unavailable. Pt currently having a test done with her in her room. Will return for eval as schedule allows.  Donny BECKER OT Acute Rehabilitation Services Office [phone]    Rodgers Dorothyann Distel 01/12/2024, 9:47 AM

## 2024-01-12 NOTE — Hospital Course (Addendum)
 Lisa Mcclure is a 89 y.o. 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 PTA around 1:30 AM she had severe chills and could not stop shaking therefore she called EMS. She was recently diagnosed with a UTI and treated with oral antibiotics. She had lower abdominal pain that started earlier this morning that resolved in the ER. She Lives alone,Independent on daily life activities w/ walker and sometimes wheelchair for ambulation. In ZI:jqzampoz, tachycardic, tachypneic, BP 135/52, requiring 3 to 5 L of oxygen  via nasal cannula.  CBC shows leukocytosis of 12.7, H&H 12.4/41.1, PLT 247.  NA: 134, K: 5.4.  BUN 30, creatinine 0.98, GFR 55.  Lipase: WNL.  Lactic acid: WNL.  COVID flu RSV negative.  UA negative for infection.  Troponin BNP elevated- flat. Chest x-ray>>no pneumonia. CT abdomen/pelvis>>no acute abnormality. 4.5 cm left adnexal simple appearing cyst unchanged other than slight increase in size from 4.  Centimeter.  Recommend pelvic ultrasound in 12 months. Large left inguinal hernia containing fat and small to moderate size right inguinal hernia containing fat.  Aortobiiliac stent within thrombosed infrarenal abdominal aortic aneurysm measuring 6.7 and maximum diameter previously 5.9.EDP discussed with vascular surgery Dr. Ishmael who recommended outpatient follow-up with Dr. Thomas.  Patient was placed on antibiotics, CT chest was ordered and admitted.Ct chest-NAD. Likely viral etiology manage improved with antibiotics.  Seen by cardiology underwent repeat echocardiogram stable with moderate to severe AS GDMT Entresto  Aldactone  discontinued. Hospitalized uncomfortable hyperkalemia  seen by nephrology potassium improving continue [facility] and outpatient follow-up Patient Antibiotics.  She is doing well on room air able to ambulate well and she will be discharged home with home health and daughter will be staying the night with her  Subjective: Seen and examined. She declined to stay any more,And would feel better at home Subcutaneous ecchymosis in the setting of Eliquis  and blood draw on hand  and stable, no skin breakdown Stares she has paper like skin  Discharge diagnosis:  Acute hypoxemic respiratory failure Cough/congestin Question viral illness Elevated but flat troponin likely demand ischemia-: Unclear etiology,suspect multifactorial question viral illness patient has been having cough and cold for few weeks and recently treated with UTI, also has component of CHF. CXR/CT chest negative for any acute findings. COVID flu RSV negative.UA negative.   Managed w/ empiric antibiotics, blood culture  NGTD,Pro-Cal is slightly elevated. Complete antibiotics today. Echo shows EF 65 to 70%, RV size function normal AV is thickened calcified with restricted motion moderate to severe AS mean gradient has increased from 20-30 mm hg compared to 2024.   Hyperkalemia Hyponatremia Mild metabolic acidosis: Potassium remains persistently elevated despite of being on Lokelma low potassium diet, renal function is stable  Lasix  has been held, nephrology consulted potassium improving -nephrology advised to resume low-dose Lasix  and low potassium diet Lokelma x 3 days patient has been extensively educated to avoid high potassium diet and avoid salt supplement which has potassium  Follow-up with PCP/nephrology to monitor labs Recent Labs  Lab 01/11/24 1853 01/12/24 0040 01/13/24 0422 01/14/24 0727 01/14/24 1005 01/15/24 0359 01/16/24 0430  K  --    < > 4.7 5.9* 5.7* 5.6* 5.2*  CALCIUM  --    < > 7.0* 7.4*  7.7* 8.0* 8.5*  MG 2.4  --   --   --   --   --   --   PHOS 2.6  --   --    --   --   --   --    < > = values in this interval not displayed.   CKD stage IIIa: Stable.Continue to monitor.  Nephrology following  CHpEF Severe aortic stenosis Status post AAA repair and AAA pseudoaneurysm repair Soft blood pressure/orthostatic hypotension: Followed by cardiology and vascular surgery outpatient. CT abdomen shows increasing size of AAA from 5.9 to 6.7 cm.  EDP discussed with on-call vascular surgery Dr. Magda who recommended outpatient follow-up with Dr. Thomas. Previously she had wished for DNR, palliative consulted. GDMT on hold due to blood pressure, continue Toprol , Farxiga   Paroxysmal A-fib Left ventricular thrombus history: Continue Eliquis  and tele   Continue hypothyroidism:  Continue levothyroxine    OSA: Not on CPAP. Has oxygen  at home however she does not use it regularly  COPD: Stable continue home inhalers    Anxiety: Continue BuSpar .   Left adnexal cyst: Per CT report slightly increase in size from 4.1 cm to 4.5 cm.  Radiology recommended follow-up pelvic ultrasound in 12 months   Large left inguinal hernia: Noted on CT abdomen.  No sign of obstruction   Low back pain: Continue pain control patient reports taking Norco at home   Left hand ecchymotic bleeding: In the setting of Eliquis  use within the skin and blood draws.  Slightly getting better advised to follow-up with PCP later this week  [place]rette I Obesity w/ Body mass index is 34.99 kg/m.: Will benefit with PCP follow-up, weight loss,healthy lifestyle   Mobility: PT Orders: Active PT Follow up Rec: Home Health Pt10/09/2023 1520   DVT prophylaxis: apixaban  (ELIQUIS ) tablet 5 mg Start: 01/11/24 2330 SCDs Start: 01/11/24 1402 Code Status:   Code Status: Limited: Do not attempt resuscitation (DNR) -DNR-LIMITED -Do Not Intubate/DNI  Family Communication: plan of care discussed with patient.  Daughter not at the bedside today Patient status is: Remains hospitalized because of severity  of illness Level of care: Telemetry  Dispo: The patient is from: home            Anticipated disposition: home w/ hh Objective: Vitals last 24 hrs: Vitals:   01/16/24 0426 01/16/24 0440 01/16/24 0756 01/16/24 1431  BP:  (!) 162/78  (!) 160/80  Pulse:  64  65  Resp:  19  17  Temp:  (!) 97.5 F (36.4 C)  98.8 F (37.1 C)  TempSrc:  Oral  Oral  SpO2:  96% 97% 97%  Weight: 89.6 kg     Height:        Physical Examination: General exam: AAOX3, NAD HEENT:Oral mucosa moist, Ear/Nose WNL grossly Respiratory system: CTA B/L,no use of accessory muscle Cardiovascular system: S1 & S2 +, No JVD. Gastrointestinal system: Abdomen soft nt Nervous System: Alert, awake, moving all extremities,and following commands. Extremities: extremities warm, leg edema trace, left hand w/ ecchymoses/skin bleeding-stable Skin: No rashes,no icterus. MSK: Normal muscle bulk,tone, power

## 2024-01-12 NOTE — Consult Note (Signed)
 Cardiology Consultation   Patient ID: IVEE POELLNITZ MRN: [id number]; DOB: 1932-09-05  Admit date: 01/11/2024 Date of Consult: 01/12/2024  PCP:  Yolande Toribio MATSU, MD    [facility] Providers Cardiologist:  Gordy Bergamo, MD        Patient Profile: Lisa Mcclure is a 89 y.o. female with a hx of moderate-severe aortic stenosis managed conservatively, chronic HFpEF, hypertension, paroxysmal atrial fibrillation s/p prior DCCV 04/2022, occasional PACs/frequent PVCs (30.1%) by monitor 01/2023, prior GIB in 2023, venous insufficiency, COPD, OSA not on CPAP, AAA repair in 12/2019, pseudoaneurysm sac repair of AAA 10/2022, hypothyroidism, anxiety, GERD, CKD stage 3a who is being seen 01/12/2024 for the evaluation of CHF at the request of Dr. Christobal.  History of Present Illness: Ms. Gauger follows with Dr. Bergamo with the above history. She has been known to have history of moderate-severe AS. She was previously going to be evaluated to consider TAVR but had a fall requiring ORIF in 01/2023. Following this she has had conversations with Dr. Bergamo about plan of care and has wished to avoid procedural interventions, opting for medical therapy approach instead.   She presented to the ED with complaints of ongoing productive cough for several weeks with Elmer's glue colored phlegm. She has been dealing with multiple orthopedic issues recently including severe chronic shoulder pain and recovery from her hip fracture and is essentially wheelchair bound. She requires aides to come in twice a day and her daughter also assists with care. From cardiac standpoint she reports she has done well recently with no significant edema, chest pain, SOB, dizziness, or edema. She had recently been tx with abx for UTI as OP.  Yesterday morning she got up to let her dog out then came back to the living room and noticed onset of severe chills/rigors. Her aide wasn't scheduled to come for another few hours so she  called EMS. She also had some lower abdominal pain. When she got here she began having back pain and left hip pain as well. Upon arrival to ED she was 99.55F, tachycardic, tachypneic and required 3-5L [place] O2. Labs showed leukocytosis of 12.7, elevated BUN 30 with Cr 0.98, hyperkalemia of 5.4, mild hyponatremia, Covid/flu/RSV neg, RVP neg, TSH wnl, pBNP 1619, hsTrop 95-125-108-120-101. UA with elevated specific gravity and glycosuria. She was briefly hypotensive in the ED in the 80s with sinus tach as well. Tmax 100.3 overnight which came down this AM.Pro-cal pending, remains on [place] O2. CXR showed chronic lung disease. CT a/p showed adnexal cyst, large inguinal hernia, aorto bi-iliac stent within a thrombosed infrarenal abdominal aortic aneurysm measuring 6.7 cm in maximum diameter, previously 5.9 cm, embolization coils in the region of the previously demonstrated endoleak with no residual leak seen. Vascular surgery recommended outpatient f/u. CT chest nonacute. She was given 1L LR, 1 dose of antibiotics, 40mg  IV Lasix , nebulizer, oxycodone /fentanyl  for back pain, Lokelma. She remains free of SOB, chest pain, orthopnea and edema today, just continues to feel somewhat weak. She was getting cleaned up by staff when I interviewed her and her daughter and she was able to lay flat for an extended period of time with no dyspnea.  EKG/telemetry c/w NSR with PACs, PVCs.   Past Medical History:  Diagnosis Date   Acute renal failure superimposed on stage 3b chronic kidney disease (HCC) 06/15/2021   Anemia    h/o hemorrhoidal bleeding and blood transfusion   Aortic stenosis    Chronic diastolic CHF (congestive heart failure) (HCC)  Depression    Diverticulosis    DOE (dyspnea on exertion)    Esophageal reflux    GERD (gastroesophageal reflux disease)    Hypothyroidism    Insomnia    LBP (low back pain)    OAB (overactive bladder)    Obesity    OSA (obstructive sleep apnea)    Osteoarthritis    Paroxysmal  atrial fibrillation (HCC)    Rheumatoid arthritis(714.0)    Shoulder pain, bilateral    Unspecified essential hypertension    Venous insufficiency     Past Surgical History:  Procedure Laterality Date   ABDOMINAL AORTIC ENDOVASCULAR STENT GRAFT N/A 12/20/2019   Procedure: Aortogram including catheter selection of aorta and bilateral iliac arteriogram, Endovascular repair of infrarenal abdominal aortic aneurysm with bifurcated stent graft (26 mm x 14 x 12 main body, right bell bottom with a 20 mm x 10 cm piece, and left bell bottom with a 16 mm x 12 cm piece) ;  Surgeon: Gretta Lonni PARAS, MD;  Location: [facility];  Service: Vascular;  Laterality: N/A;   APPENDECTOMY  1953   BIOPSY  06/18/2021   Procedure: BIOPSY;  Surgeon: Saintclair Jasper, MD;  Location: [facility] ENDOSCOPY;  Service: Gastroenterology;;   bladder abduction-1996  1996   breast biopsy Right 1980   CARDIOVERSION N/A 12/24/2019   Procedure: CARDIOVERSION;  Surgeon: Ladona Heinz, MD;  Location: [facility] ENDOSCOPY;  Service: Cardiovascular;  Laterality: N/A;   CARDIOVERSION N/A 04/25/2022   Procedure: CARDIOVERSION;  Surgeon: Ladona Heinz, MD;  Location: [facility] ENDOSCOPY;  Service: Cardiovascular;  Laterality: N/A;   CESAREAN SECTION     1957, 1961, 1964   COSMETIC SURGERY  1996   CYSTOCELE REPAIR     ESOPHAGOGASTRODUODENOSCOPY (EGD) WITH PROPOFOL  N/A 06/18/2021   Procedure: ESOPHAGOGASTRODUODENOSCOPY (EGD) WITH PROPOFOL ;  Surgeon: Saintclair Jasper, MD;  Location: [facility] ENDOSCOPY;  Service: Gastroenterology;  Laterality: N/A;   FLEXIBLE SIGMOIDOSCOPY N/A 04/10/2015   Procedure: FLEXIBLE SIGMOIDOSCOPY;  Surgeon: Elsie Cree, MD;  Location: [facility] ENDOSCOPY;  Service: Endoscopy;  Laterality: N/A;   INTRAMEDULLARY (IM) NAIL INTERTROCHANTERIC Left 02/03/2023   Procedure: LEFT INTRAMEDULLARY (IM) NAIL INTERTROCHANTERIC;  Surgeon: Vernetta Lonni GRADE, MD;  Location: [facility] OR;  Service: Orthopedics;  Laterality: Left;   IR AORTAGRAM ABDOMINAL SERIALOGRAM  10/26/2022   IR  EMBO ARTERIAL NOT HEMORR HEMANG INC GUIDE ROADMAPPING  10/24/2022   IR RADIOLOGIST EVAL & MGMT  09/13/2022   IR RADIOLOGIST EVAL & MGMT  11/24/2022   IR US  GUIDE VASC ACCESS LEFT  10/26/2022   IR US  GUIDE VASC ACCESS LEFT  10/26/2022   IR US  GUIDE VASC ACCESS LEFT  10/26/2022   IR US  GUIDE VASC ACCESS LEFT  10/26/2022   knee arthroscopy Right 1996, 2010   KNEE ARTHROSCOPY W/ AUTOGENOUS CARTILAGE IMPLANTATION (ACI) PROCEDURE Left 1994, 1995   REFRACTIVE SURGERY  01/2020   SHOULDER SURGERY  1990   TEE WITHOUT CARDIOVERSION N/A 12/24/2019   Procedure: TRANSESOPHAGEAL ECHOCARDIOGRAM (TEE);  Surgeon: Ladona Heinz, MD;  Location: [facility] ENDOSCOPY;  Service: Cardiovascular;  Laterality: N/A;   TEE WITHOUT CARDIOVERSION N/A 06/23/2022   Procedure: TRANSESOPHAGEAL ECHOCARDIOGRAM (TEE);  Surgeon: Dewane Shiner, DO;  Location: [facility] ENDOSCOPY;  Service: Cardiovascular;  Laterality: N/A;   TOTAL ABDOMINAL HYSTERECTOMY  1972   ULTRASOUND GUIDANCE FOR VASCULAR ACCESS Bilateral 12/20/2019   Procedure: Ultrasound-guided access of bilateral common femoral arteries for delivery of endograft and percutaneous closure;  Surgeon: Gretta Lonni PARAS, MD;  Location[facility] OR;  Service: Vascular;  Laterality: Bilateral;   VESICOVAGINAL FISTULA  CLOSURE W/ TAH     WRIST SURGERY  1967     Home Medications:  Prior to Admission medications   Medication Sig Start Date End Date Taking? Authorizing Provider  ALEVE ARTHRITIS PAIN 1 % GEL Apply 2 g topically 4 (four) times daily as needed (for pain).    [provider]  apixaban  (ELIQUIS ) 5 MG TABS tablet Take 1 tablet (5 mg total) by mouth 2 (two) times daily. 03/13/23   Ladona Heinz, MD  budesonide  (PULMICORT ) 0.5 MG/2ML nebulizer solution Take 2 mLs (0.5 mg total) by nebulization in the morning and at bedtime. Patient taking differently: Take 0.5 mg by nebulization daily. 03/22/22   Hope Almarie ORN, NP  busPIRone  (BUSPAR ) 7.5 MG tablet Take 7.5 mg by mouth See admin  instructions. Take 7.5 mg by mouth at bedtime and an additional 7.5 mg once a day as needed for anxiety    [provider]  Cholecalciferol  (VITAMIN D3) 125 MCG (5000 UT) CAPS Take 1 capsule by mouth daily.    [provider]  dapagliflozin  propanediol (FARXIGA ) 10 MG TABS tablet Take 1 tablet (10 mg total) by mouth daily before breakfast. 12/01/23   Ladona Heinz, MD  diclofenac  sodium (VOLTAREN ) 1 % GEL Apply 2.25 g topically 3 (three) times daily as needed (for pain). 10/23/18   [provider]  furosemide  (LASIX ) 20 MG tablet Take 1 tablet (20 mg total) by mouth daily. 09/25/23   Ladona Heinz, MD  latanoprost  (XALATAN ) 0.005 % ophthalmic solution Place 1 drop into both eyes at bedtime.    [provider]  levothyroxine  (SYNTHROID ) 125 MCG tablet Take 125 mcg by mouth daily before breakfast.     [provider]  melatonin 3 MG TABS tablet Take 3 mg by mouth at bedtime.    [provider]  methocarbamol  (ROBAXIN ) 500 MG tablet Take 500 mg by mouth every 6 (six) hours as needed. 06/26/23   [provider]  Multiple Vitamins-Minerals (PRESERVISION AREDS 2 PO) Take 1 capsule by mouth in the morning and at bedtime.    [provider]  nortriptyline  (PAMELOR ) 10 MG capsule Take 10 mg by mouth at bedtime. 08/31/22   [provider]  polyethylene glycol (MIRALAX  / GLYCOLAX ) 17 g packet Take 17 g by mouth 2 (two) times daily. Decrease to daily dosing if developing diarrhea/watery stools Patient taking differently: Take 17 g by mouth daily as needed. Decrease to daily dosing if developing diarrhea/watery stools 02/07/23   Briana Elgin LABOR, MD  rOPINIRole  (REQUIP ) 1 MG tablet Take 1-2 mg by mouth See admin instructions. Take 2mg  by mouth at 1 PM and 1mg  at 9 PM 06/19/15   [provider]  sacubitril -valsartan  (ENTRESTO ) 24-26 MG Take 1 tablet by mouth 2 (two) times daily. 02/06/23   Ladona Heinz, MD  spironolactone  (ALDACTONE ) 25 MG  tablet Take 25 mg by mouth daily. 11/24/22   [provider]  SYSTANE COMPLETE PF 0.6 % SOLN Place 1 drop into both eyes 4 (four) times daily as needed (for dryness).    [provider]    Scheduled Meds:  apixaban   5 mg Oral BID   budesonide   0.5 mg Nebulization Daily   busPIRone   7.5 mg Oral QHS   dapagliflozin  propanediol  10 mg Oral QAC breakfast   furosemide   40 mg Intravenous Daily   Influenza vac split trivalent PF  0.5 mL Intramuscular Tomorrow-1000   levothyroxine   125 mcg Oral QAC breakfast   nortriptyline   10 mg Oral QHS   rOPINIRole   2 mg Oral Daily   And   rOPINIRole   1 mg Oral QHS   sodium zirconium cyclosilicate  10 g Oral Once   Continuous Infusions:  PRN Meds: acetaminophen  **OR** acetaminophen , busPIRone , lip balm, ondansetron  **OR** ondansetron  (ZOFRAN ) IV, oxyCODONE   Allergies:    Allergies  Allergen Reactions   Statins Other (See Comments)    Muscle aches and INTERNAL BLEEDING   Atorvastatin Other (See Comments)    Myalgias   Gabapentin Other (See Comments)    Made me loopy   Methocarbamol  Other (See Comments)    Made me loopy    Social History:   Social History   Socioeconomic History   Marital status: Widowed    Spouse name: Not on file   Number of children: 3   Years of education: College   Highest education level: Bachelor's degree (e.g., BA, AB, BS)  Occupational History   Occupation: Retired  Tobacco Use   Smoking status: Former    Current packs/day: 0.00    Average packs/day: 0.2 packs/day for 10.0 years (2.0 ttl pk-yrs)    Types: Cigarettes    Start date: 04/11/1974    Quit date: 04/11/1984    Years since quitting: 39.7    Passive exposure: Past   Smokeless tobacco: Never  Vaping Use   Vaping status: Never Used  Substance and Sexual Activity   Alcohol  use: Yes    Alcohol /week: 0.0 standard drinks of alcohol     Comment: cocktail once a week   Drug use: No   Sexual activity: Not Currently  Other Topics  Concern   Not on file  Social History Narrative   Widowed   Lives alone   3 children, 2 living   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, Production designer, theatre/television/film of physician's office   Went to [place] May 2016 for 11 days   Drinks 1 cup of coffee in the morning   Social Drivers of Health   Financial Resource Strain: Low Risk  (08/11/2021)   Overall Financial Resource Strain (CARDIA)    Difficulty of Paying Living Expenses: Not hard at all  Food Insecurity: No Food Insecurity (01/11/2024)   Hunger Vital Sign    Worried About Running Out of Food in the Last Year: Never true    Ran Out of Food in the Last Year: Never true  Transportation Needs: No Transportation Needs (01/11/2024)   PRAPARE - Administrator, Civil Service (Medical): No    Lack of Transportation (Non-Medical): No  Physical Activity: Inactive (08/11/2021)   Exercise Vital Sign    Days of Exercise per Week: 0 days    Minutes of Exercise per Session: 0 min  Stress: No Stress Concern Present (08/11/2021)   [employer] of Occupational Health - Occupational Stress Questionnaire    Feeling of Stress : Only a little  Social Connections: Moderately Integrated (01/11/2024)   Social Connection and Isolation Panel    Frequency of Communication with Friends and Family: More than three times a week    Frequency of Social Gatherings with Friends and Family: More than three times a week    Attends Religious Services: More than 4 times per year    Active Member of [employer] or Organizations: Yes    Attends Banker Meetings: More than 4 times per year    Marital Status: Widowed  Intimate Partner Violence: Not At Risk (01/11/2024)   Humiliation, Afraid, Rape, and Kick questionnaire    Fear  of Current or Ex-Partner: No    Emotionally Abused: No    Physically Abused: No    Sexually Abused: No    Family History:    Family History  Problem Relation Age of Onset   Lung cancer Mother    COPD Father    Breast cancer Maternal Aunt     Heart attack Maternal Grandmother 62   Lung cancer Son      ROS:  Please see the history of present illness.  All other ROS reviewed and negative.     Physical Exam/Data: Vitals:   01/11/24 2103 01/12/24 0121 01/12/24 0635 01/12/24 0802  BP: (!) 104/43 (!) 99/41 (!) 127/49   Pulse: (!) 56 88 87   Resp: 20 20 18    Temp: 97.7 F (36.5 C) 100.3 F (37.9 C) 98.7 F (37.1 C)   TempSrc: Oral Oral Oral   SpO2: 94% 96% 95% 95%  Weight:      Height:        Intake/Output Summary (Last 24 hours) at 01/12/2024 0857 Last data filed at 01/12/2024 0034 Gross per 24 hour  Intake 1350 ml  Output 900 ml  Net 450 ml      01/11/2024    8:59 PM 12/28/2023    1:23 PM 12/14/2023    8:15 AM  Last 3 Weights  Weight (lbs) 196 lb 3.4 oz -- 192 lb  Weight (kg) 89 kg -- 87.091 kg     Body mass index is 34.76 kg/m.  General: Well developed, well nourished, in no acute distress. Head: Normocephalic, atraumatic, sclera non-icteric, no xanthomas, nares are without discharge. Neck: Negative for carotid bruits. JVP not elevated. Lungs: Coarse BS bilaterally to auscultation without wheezes or rhonchi. Breathing is unlabored. Heart: RRR 3/6 SEM RUSB no rubs or gallops.  Abdomen: Soft, non-tender, non-distended with normoactive bowel sounds. No rebound/guarding. Extremities: No clubbing or cyanosis. No edema. Distal pedal pulses are 2+ and equal bilaterally. Neuro: Alert and oriented X 3. Moves all extremities spontaneously. Psych:  Responds to questions appropriately with a normal affect.   EKG:  The EKG was personally reviewed and demonstrates:  Initial tracing shows sinus tach 101bpm, 1st degree AVB, low voltage, otherwise nonspecific STT changes. F/u tracing shows sinus tach with first degree AVB with occasional PACs, PVCs, otherwise similar morphology as prior. Telemetry:  Telemetry was personally reviewed and demonstrates:  NSR, occasional PACs, PVCs  Relevant CV Studies: 2d echo  01/20/2023   1. Left ventricular ejection fraction, by estimation, is 60 to 65%. The  left ventricle has normal function. The left ventricle has no regional  wall motion abnormalities. There is moderate concentric left ventricular  hypertrophy. Left ventricular  diastolic parameters are indeterminate.   2. Right ventricular systolic function is normal. The right ventricular  size is normal.   3. Left atrial size was moderately dilated.   4. The mitral valve is degenerative. Mild mitral valve regurgitation. No  evidence of mitral stenosis.   5. The aortic valve is tricuspid. There is severe calcifcation of the  aortic valve. Aortic valve regurgitation is trivial. Moderate to severe  aortic valve stenosis. Aortic valve area, by VTI measures 0.95 cm. Aortic  valve mean gradient measures 19.5  mmHg. Aortic valve Vmax measures 2.96 m/s.   6. The inferior vena cava is normal in size with greater than 50%  respiratory variability, suggesting right atrial pressure of 3 mmHg.   Laboratory Data: High Sensitivity Troponin:  No results for input(s): TROPONINIHS in the  last 720 hours.   Chemistry Recent Labs  Lab 01/11/24 0834 01/11/24 1853 01/12/24 0040  NA 134*  --  133*  K 5.4*  --  5.2*  CL 103  --  101  CO2 21*  --  20*  GLUCOSE 99  --  83  BUN 30*  --  26*  CREATININE 0.98 1.19* 1.15*  CALCIUM 8.0*  --  7.6*  MG  --  2.4  --   GFRNONAA 55* 44* 46*  ANIONGAP 11  --  12    Recent Labs  Lab 01/11/24 0834  PROT 7.3  ALBUMIN  3.9  AST 17  ALT 7  ALKPHOS 101  BILITOT 0.5   Lipids No results for input(s): CHOL, TRIG, HDL, LABVLDL, LDLCALC, CHOLHDL in the last 168 hours.  Hematology Recent Labs  Lab 01/11/24 0834 01/11/24 1853 01/12/24 0040  WBC 12.7* 12.2* 9.8  RBC 4.17 3.85* 4.01  HGB 12.4 11.2* 12.0  HCT 41.1 37.7 39.0  MCV 98.6 97.9 97.3  MCH 29.7 29.1 29.9  MCHC 30.2 29.7* 30.8  RDW 13.5 13.8 13.7  PLT 247 200 200   Thyroid   Recent Labs  Lab  01/11/24 2220  TSH 0.535    BNP Recent Labs  Lab 01/11/24 1338  PROBNP 1,619.0*    DDimer No results for input(s): DDIMER in the last 168 hours.  Radiology/Studies:  CT CHEST WO CONTRAST Result Date: 01/11/2024 EXAM: CT CHEST WITHOUT CONTRAST 01/11/2024 04:15:58 PM TECHNIQUE: CT of the chest was performed without the administration of intravenous contrast. Multiplanar reformatted images are provided for review. Automated exposure control, iterative reconstruction, and/or weight based adjustment of the mA/kV was utilized to reduce the radiation dose to as low as reasonably achievable. COMPARISON: Comparison exam 11/23/2023. CLINICAL HISTORY: Respiratory illness, nondiagnostic xray. Got up to let dog out then sat in living room and could not stop shaking, like rigors and chills. Has had it happen before but never extended that long. FINDINGS: MEDIASTINUM: Coronary artery calcification and aortic atherosclerotic calcification. Heart and pericardium are unremarkable. The central airways are clear. LYMPH NODES: No mediastinal, hilar or axillary lymphadenopathy. LUNGS AND PLEURA: Mild bibasilar atelectasis. Mild pleural thickening. No airspace disease. No pneumonia. No suspicious pulmonary nodules. No pleural effusion or pneumothorax. SOFT TISSUES/BONES: No acute abnormality of the bones or soft tissues. UPPER ABDOMEN: Limited images of the upper abdomen demonstrates no acute abnormality. IMPRESSION: 1. No acute intrathoracic findings. Electronically signed by: Norleen Boxer MD 01/11/2024 04:48 PM EDT RP Workstation: [id number]   CT ABDOMEN PELVIS W CONTRAST Result Date: 01/11/2024 CLINICAL DATA:  Left lower quadrant abdominal pain, chills and fever since this morning. Recently treated for a urinary tract infection. Pelvic pain. Shortness of breath. EXAM: CT ABDOMEN AND PELVIS WITH CONTRAST TECHNIQUE: Multidetector CT imaging of the abdomen and pelvis was performed using the standard protocol following  bolus administration of intravenous contrast. RADIATION DOSE REDUCTION: This exam was performed according to the departmental dose-optimization program which includes automated exposure control, adjustment of the mA and/or kV according to patient size and/or use of iterative reconstruction technique. CONTRAST:  OMNIPAQUE  IOHEXOL  300 MG/ML  SOLN COMPARISON:  07/02/2022 FINDINGS: Lower chest: Atheromatous calcifications, including the coronary arteries and aorta. Dense mitral valve annulus calcifications. Mildly enlarged heart. No pericardial effusion. Mild-to-moderate dependent atelectasis at both lung bases. Hepatobiliary: No focal liver abnormality is seen. No gallstones, gallbladder wall thickening, or biliary dilatation. Pancreas: Moderate diffuse pancreatic atrophy. Spleen: Normal in size without focal abnormality. Adrenals/Urinary Tract: Adrenal glands  are unremarkable. Kidneys are normal, without renal calculi, focal lesion, or hydronephrosis. Bladder is unremarkable. There is some air in the urinary bladder, compatible with recent catheterization. Stomach/Bowel: Scattered colonic diverticula, most numerous in the right colon and sigmoid colon, without evidence of diverticulitis. Surgically absent appendix. Unremarkable stomach and small bowel. Vascular/Lymphatic: Extensive atheromatous arterial calcifications. Aorto bi-iliac stent within a thrombosed infrarenal abdominal aortic aneurysm measuring 6.7 cm in maximum diameter. There are multiple interval embolization coils in the region of the previously demonstrated endoleak. There is a small amount of developing calcification within the thrombus on the right on image number 43/11, measuring 209 Hounsfield units in density and a small amount of developing curvilinear calcification within the thrombus posteriorly on the left on image number 46/11. No enlarged lymph nodes. Reproductive: A 4.5 cm left adnexal simple appearing cyst measuring 7 Hounsfield  units in density on image number 68/11 is unchanged other than a slight increase in size from 4.1 cm in maximum diameter previously. Surgically absent uterus and ovaries. Other: Large left inguinal hernia containing fat and small to moderate-sized right inguinal hernia containing fat. Musculoskeletal: Left hip fixation hardware. Lower thoracic spine degenerative changes. IMPRESSION: 1. No acute abnormality. 2. Colonic diverticulosis without evidence of diverticulitis. 3. 4.5 cm left adnexal simple appearing cyst, unchanged other than a slight increase in size from 4.1 cm in maximum diameter previously. Recommend follow-up pelvic ultrasound in 12 months. 4. Large left inguinal hernia containing fat and small to moderate-sized right inguinal hernia containing fat. 5. Aorto bi-iliac stent within a thrombosed infrarenal abdominal aortic aneurysm measuring 6.7 cm in maximum diameter, previously 5.9 cm. 6. Interval embolization coils in the region of the previously demonstrated endoleak with no residual leak seen. 7. Calcific coronary artery and aortic atherosclerosis. Aortic Atherosclerosis (ICD10-I70.0). Electronically Signed   By: Elspeth Bathe M.D.   On: 01/11/2024 11:52   DG Chest Portable 1 View Result Date: 01/11/2024 CLINICAL DATA:  Cough, shortness of breath, fever and chills EXAM: PORTABLE CHEST 1 VIEW COMPARISON:  11/22/2023 FINDINGS: Stable cardiac enlargement and aortic tortuosity. Prominent central pulmonary arteries. Stable chronic lung disease with bilateral pulmonary interstitial prominence and scattered bilateral pulmonary scarring. No overt pulmonary edema, pleural fluid or airspace consolidation. No pneumothorax. The visualized skeletal structures are unremarkable. IMPRESSION: Stable chronic lung disease with bilateral pulmonary interstitial prominence and scattered bilateral pulmonary scarring. No acute findings. Electronically Signed   By: Marcey Moan M.D.   On: 01/11/2024 09:21      Assessment and Plan:  1. Low grade temperature elevation, rigors/chills, back pain, hip pain, productive cough, acute hypoxemic respiratory failure, SIRS criteria with sinus tach and hypotension - presenting symptoms not entirely c/w CHF exacerbation, some concern for infectious in nature - further w/u and management per primary team - blood cultures, pro-cal pending  2. Chronic HFpEF - CXR/CT chest showing chronic lung findings but no pleural effusions or airspace disease - received 1L IVF + 40mg  IV Lasix  yesterday - will review clinically with MD prior to re-dosing Lasix , slight bump in BUN/Cr though has some degree of CKD at baseline - had recent UTI but SGLT2i continued for now, repeat UA OK, will need to follow - home Entresto  and spironolactone  on hold for now given hypotension, hyperkalemia - probably leave off spironolactone  going forward  3. Elevated troponin - relatively low/flat, peaking at only 125, suspect demand ischemia in setting of the above + known aortic stenosis - underlying coronary calcification CT noted, no recent angina - hold off  ASA given concomitant Eliquis , hx GIB  4. Paroxysmal atrial fibrillation with hx of PACs and frequent PVCs - maintaining NSR with occasional PACs/PVCs - previously on amiodarone  + carvedilol  but these were stopped when HR reported in the 30s-40s. 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 May be reasonable to trial lower dose BB when pressure is stable for suppression of high PVC burden - continue Eliquis  5mg  BID, dose appropriate  5. Moderate-severe aortic stenosis - patient has elected for non-surgical management - given the multitude of noncardiac comorbidities, advanced age and chronic pain, would be reasonable to consider engaging palliative to ensure her pain is well addressed on outpatient end as well and discuss GOC  for outpatient help  6. CKD stage 3a with hyperkalemia, hypocalcemia - defer lyte mgmt to primary team - meds adjusted above  Risk Assessment/Risk Scores:       [place]  Heart Association (NYHA) Functional Class NYHA Class II  CHA2DS2-VASc Score = 6   This indicates a 9.7% annual risk of stroke. The patient's score is based upon: CHF History: 1 HTN History: 1 Diabetes History: 0 Stroke History: 0 Vascular Disease History: 1 Age Score: 2 Gender Score: 1       For questions or updates, please contact [facility] Please consult [email or web address] for contact info under      Signed, Noura Purpura N Artasia Thang, PA-C  01/12/2024 8:57 AM

## 2024-01-12 NOTE — Evaluation (Signed)
 Occupational Therapy Evaluation Patient Details Name: Lisa Mcclure MRN: [id number] DOB: 1932/07/08 Today's Date: 01/12/2024   History of Present Illness   Patient is an 89 year old female who presented to the ED with c/o cough and chills.  Dx with acute hypoxemic RF and suspected CHF exacerbation; CT negative for acute process, TTE pending.  PMHx significant for AAA (stent, graft), A-fib, CHF (pEF), COPD (remote Hx of smoking, extensive secondhand smoke; 2 LPM PRN), OSA (no xPAP), appendectomy, hysterectomy, RA     Clinical Impressions Prior to admission, patient was living at home alone and completing self-care at baseline level.  Acute illness has contributed to a decline in occupational performance and a decrease in activity tolerance.  Patient will benefit from continued acute OT services as well as home health OT, both of which patient is agreeable to participate.  Patient will remain on acute OT caseload.  Arrived to find patient in semi fowlers and agreeable to be seen.  Patient reported having just returned to bed from sitting in recliner a few hours, which she noted provided relief of LBP which patient attributes to lying in bed.  Patient transitioned to sitting EOB with initial assist from therapist through LUE.  Patient was noted to have severely impaired bilateral shoulder flexion ROM 2/2 RA and old RTC injuries.  Patient stood ~2 mins in FWW with CGA-MinA and returned to sitting with CGA.  MinA required to return to supine for raising legs to mattress.    If plan is discharge home, recommend the following:   A little help with walking and/or transfers;A little help with bathing/dressing/bathroom;Assistance with cooking/housework;Assist for transportation     Functional Status Assessment   Patient has had a recent decline in their functional status and demonstrates the ability to make significant improvements in function in a reasonable and predictable amount of time.      Equipment Recommendations   None recommended by OT      Precautions/Restrictions   Precautions Precautions: Fall Recall of Precautions/Restrictions: Intact Restrictions Weight Bearing Restrictions Per Provider Order: No     Mobility Bed Mobility Overal bed mobility: Needs Assistance Bed Mobility: Supine to Sit, Sit to Supine    Supine to sit: Min assist Sit to supine: Min assist   General bed mobility comments: Patient required assistance for initial part of supine > sit and to bring legs into bed for sit > supine Patient Response: Cooperative  Transfers Overall transfer level: Needs assistance Equipment used: Rolling walker (2 wheels) Transfers: Sit to/from Stand Sit to Stand: Min assist   General transfer comment: Maintained supported standing ~2 mins      Balance Overall balance assessment: Mild deficits observed, not formally tested Sitting-balance support: Feet supported Sitting balance-Leahy Scale: Fair Sitting balance - Comments: Mild retropulsion observed which patient was able to correct independently Postural control: Posterior lean Standing balance support: Bilateral upper extremity supported Standing balance-Leahy Scale: Fair     ADL either performed or assessed with clinical judgement   ADL Overall ADL's : Needs assistance/impaired Eating/Feeding: Independent   Grooming: Independent;Sitting   Upper Body Bathing: Moderate assistance;Sitting   Lower Body Bathing: Maximal assistance;Sitting/lateral leans   Upper Body Dressing : Minimal assistance;Sitting   Lower Body Dressing: Maximal assistance;Sitting/lateral leans   Toilet Transfer: Modified Independent;Stand-pivot;Comfort height toilet   Toileting- Clothing Manipulation and Hygiene: Modified independent;Sit to/from stand   Tub/ Shower Transfer: Stand-pivot;Shower seat;Rolling walker (2 wheels)   Functional mobility during ADLs: Minimal assistance;Rolling walker (2 wheels) General  ADL Comments: Assistance required due to impaired shoulder strength     Vision Baseline Vision/History: 1 Wears glasses Ability to See in Adequate Light: 0 Adequate Patient Visual Report: No change from baseline       Perception Perception: Within Functional Limits      Praxis Praxis: WFL      Pertinent Vitals/Pain Pain Assessment Pain Assessment: 0-10 Pain Score: 4  Pain Location: LBP (Patient reports LBP not chronic, believes lying in bed may be factor) Pain Descriptors / Indicators: Aching, Sore Pain Intervention(s): Monitored during session, Repositioned     Extremity/Trunk Assessment Upper Extremity Assessment Upper Extremity Assessment: Right hand dominant;RUE deficits/detail;LUE deficits/detail RUE Deficits / Details: Shoulder flexion limited to 45 deg 2/2 RA and old RTC injury; gross strength 3+/5 RUE Sensation: decreased proprioception (Patient reports difficulty modulating grip force) RUE Coordination: WNL LUE Deficits / Details: Shoulder flexion limited to 45 deg 2/2 RA and old RTC injury; gross strength 3+/5 LUE Sensation: decreased proprioception (Patient reports difficulty modulating grip force) LUE Coordination: WNL   Lower Extremity Assessment Lower Extremity Assessment: Defer to PT evaluation RLE Deficits / Details: +4/5 knee ext RLE Sensation: WNL;history of peripheral neuropathy LLE Deficits / Details: +4/5 knee ext LLE Sensation: WNL;history of peripheral neuropathy   Cervical / Trunk Assessment Cervical / Trunk Assessment: Normal   Communication Communication Communication: No apparent difficulties   Cognition Arousal: Alert Behavior During Therapy: WFL for tasks assessed/performed Cognition: No apparent impairments   OT - Cognition Comments: Oriented x4   Following commands: Intact       Cueing  General Comments   Cueing Techniques: Verbal cues              Home Living Family/patient expects to be discharged to:: Private  residence Living Arrangements: Alone Available Help at Discharge: Personal care attendant;Other (Comment) (Aides present daily [phone] and [phone] daily to assist with LB ADLs, bathing, & IADLs) Type of Home: House Home Access: Level entry    Home Layout: One level    Bathroom Shower/Tub: Producer, television/film/video: Handicapped height Bathroom Accessibility: Yes How Accessible: Accessible via wheelchair Home Equipment: Agricultural consultant (2 wheels);Rollator (4 wheels);Cane - single point;Grab bars - tub/shower;Adaptive equipment;Toilet riser;Wheelchair - power [employer] Equipment: Educational psychologist Comments: aides come 9-12am and 5-8pm 7 days/week to assist with meals, bathing, dressing      Prior Functioning/Environment Prior Level of Function : Needs assist    Physical Assist : ADLs (physical)   ADLs (physical): Bathing;Dressing;IADLs Mobility Comments: Patient relies on various mobility AD for household mobility; feels challenged with community mobility due to size & weight of power WC ADLs Comments: assist from aides    OT Problem List: Decreased strength;Decreased range of motion;Decreased activity tolerance;Impaired balance (sitting and/or standing);Impaired sensation;Impaired UE functional use   OT Treatment/Interventions: Self-care/ADL training;Therapeutic exercise;Energy conservation;Manual therapy;Modalities;Therapeutic activities;Patient/family education;Balance training      OT Goals(Current goals can be found in the care plan section)   Acute Rehab OT Goals Patient Stated Goal: Maintain strength & endurance to support safe return home OT Goal Formulation: With patient Time For Goal Achievement: 01/26/24 Potential to Achieve Goals: Good ADL Goals Pt Will Perform Grooming: with modified independence;sitting Pt Will Perform Lower Body Dressing: with min assist;with adaptive equipment Pt Will Transfer to Toilet: with contact guard assist;grab bars Pt Will  Perform Toileting - Clothing Manipulation and hygiene: with min assist;with adaptive equipment   OT Frequency:  Min 2X/week       AM-PAC OT 6  Clicks Daily Activity     Outcome Measure Help from another person eating meals?: A Little Help from another person taking care of personal grooming?: A Little Help from another person toileting, which includes using toliet, bedpan, or urinal?: A Lot Help from another person bathing (including washing, rinsing, drying)?: A Lot Help from another person to put on and taking off regular upper body clothing?: A Lot Help from another person to put on and taking off regular lower body clothing?: A Lot 6 Click Score: 14   End of Session Equipment Utilized During Treatment: Gait belt;Rolling walker (2 wheels)  Activity Tolerance: Patient tolerated treatment well Patient left: in bed;with call bell/phone within reach;with bed alarm set  OT Visit Diagnosis: Unsteadiness on feet (R26.81);Muscle weakness (generalized) (M62.81)                Time: 8556-8484 OT Time Calculation (min): 32 min Charges:  OT General Charges $OT Visit: 1 Visit OT Evaluation $OT Eval Moderate Complexity: 1 Mod OT Treatments $Therapeutic Activity: 8-22 mins  Belvie KATHEE Bud, MS, OTR/L 01/12/2024, 4:25 PM

## 2024-01-12 NOTE — Progress Notes (Signed)
 PROGRESS NOTE Lisa Mcclure  FMW:[id number] DOB: 11-Apr-1933 DOA: 01/11/2024 PCP: Yolande Toribio MATSU, MD  Brief Narrative/Hospital Course: Lisa Mcclure is a 89 y.o. 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 PTA around 1:30 AM she had severe chills and could not stop shaking therefore she called EMS. She was recently diagnosed with a UTI and treated with oral antibiotics. She had lower abdominal pain that started earlier this morning that resolved in the ER. She Lives alone,Independent on daily life activities w/ walker and sometimes wheelchair for ambulation. In ZI:jqzampoz, tachycardic, tachypneic, BP 135/52, requiring 3 to 5 L of oxygen  via nasal cannula.  CBC shows leukocytosis of 12.7, H&H 12.4/41.1, PLT 247.  NA: 134, K: 5.4.  BUN 30, creatinine 0.98, GFR 55.  Lipase: WNL.  Lactic acid: WNL.  COVID flu RSV negative.  UA negative for infection.  Troponin BNP elevated- flat. Chest x-ray>>no pneumonia. CT abdomen/pelvis>>no acute abnormality. 4.5 cm left adnexal simple appearing cyst unchanged other than slight increase in size from 4.  Centimeter.  Recommend pelvic ultrasound in 12 months. Large left inguinal hernia containing fat and small to moderate size right inguinal hernia containing fat.  Aortobiiliac stent within thrombosed infrarenal abdominal aortic aneurysm measuring 6.7 and maximum diameter previously 5.9.  EDP discussed with vascular surgery Dr. Ishmael who recommended outpatient follow-up with Dr. Thomas.  Patient was placed on antibiotics, CT chest was ordered and admitted. Ct chest - NAD  Subjective: Seen and examined today Overnight low-grade temp 100.3  BP 90s-120s, on nasal cannula.  Yesterday she was hypotensive in 80s in the afternoon Labs reviewed potassium  5.4> 5.2, bicarb 20 BUN/creatinine stable, troponin trending down 100 proBNP 1619 CBC stable-with improving WBC TSH 0.5 She feels somewhat better today, family at the bedside.  No fever no chills, has been having cough for few weeks. Complains of restless leg and requesting Requip  Echo is being done  Assessment and plan:  Acute hypoxemic respiratory failure Cough: Question viral illness Elevated but flat troponin: Unclear etiology,suspect multifactorial question viral illness patient has been having cough and cold for few weeks and recently treated with UTI, also has component of CHF. Chest x-ray and CT chest negative for any acute findings S/ P Rocephin and azithromycin  in the ED. patient had low-grade temperatures  overnight- will keep antibiotics on, Pro-Cal is slightly elevated. COVID flu RSV negative.UA negative.  Follow-up blood culture Fu TTE as BNP elevated.  Consulted cardiology.  Holding GDMT Lasix  due to soft BP will check orthostatics and give IV fluids if needed-discussed with cardiology   Severe aortic stenosis Status post AAA repair and AAA pseudoaneurysm repair Followed by cardiology and vascular surgery outpatient. CT abdomen shows increasing size of AAA from 5.9 to 6.7 cm.  EDP discussed with on-call vascular surgery Dr. Ishmael who recommended outpatient follow-up with Dr. Thomas.  Previously she had wished for DNR, palliative consulted   Paroxysmal A-fib: Continue Eliquis .  Monitor closely on telemetry.   Left ventricular thrombus: On Eliquis    Hypothyroidism:  Continue current dose of levothyroxine    OSA: Not on CPAP. has oxygen  at home however she does not use it regularly   Hyperkalemia: Cont Lokelma x 1. Trend Hold on Aldactone .  Hyponatremia: Mild. Continue to monitor   CKD stage IIIa: Stable.  Continue to monitor   COPD: No wheezing noted  on exam.  Will continue home inhalers.   Anxiety: Continue BuSpar    Left adnexal cyst:  Per CT report slightly increase in size from 4.1 cm to 4.5 cm.  Radiology recommended follow-up pelvic ultrasound in 12 months   Large left inguinal hernia: Noted on CT abdomen.  No sign of obstruction   Low back pain: Patient tells me that she takes hydrocodone  at home.  I do not see in her medication list. Cont  Oxycodone  as needed ordered.. cont her nortriptyline    Leverette I Obesity w/ Body mass index is 34.76 kg/m.: Will benefit with PCP follow-up, weight loss,healthy lifestyle   Mobility: PT Orders: Active  PT Follow up Rec:    DVT prophylaxis: apixaban  (ELIQUIS ) tablet 5 mg Start: 01/11/24 2330 SCDs Start: 01/11/24 1402 Code Status:   Code Status: Limited: Do not attempt resuscitation (DNR) -DNR-LIMITED -Do Not Intubate/DNI  Family Communication: plan of care discussed with patient/family at bedside. Patient status is: Remains hospitalized because of severity of illness Level of care: Progressive   Dispo: The patient is from: home            Anticipated disposition: TBD Objective: Vitals last 24 hrs: Vitals:   01/12/24 0121 01/12/24 0635 01/12/24 0802 01/12/24 1045  BP: (!) 99/41 (!) 127/49  (!) 96/53  Pulse: 88 87  61  Resp: 20 18  17   Temp: 100.3 F (37.9 C) 98.7 F (37.1 C)  97.6 F (36.4 C)  TempSrc: Oral Oral  Oral  SpO2: 96% 95% 95% 96%  Weight:      Height:        Physical Examination: General exam: alert awake, oriented, older than stated age HEENT:Oral mucosa moist, Ear/Nose WNL grossly Respiratory system: Bilaterally clear BS,no use of accessory muscle Cardiovascular system: S1 & S2 +, No JVD. Gastrointestinal system: Abdomen soft,NT,ND, BS+ Nervous System: Alert, awake, moving all extremities,and following commands. Extremities: extremities warm, leg edema neg Skin: No rashes,no icterus. MSK: Normal muscle bulk,tone, power   Medications reviewed:   Scheduled Meds:  apixaban   5 mg Oral BID   budesonide   0.5 mg Nebulization Daily   busPIRone   7.5 mg Oral QHS   cholecalciferol   5,000 Units Oral Daily   dapagliflozin  propanediol  10 mg Oral QAC breakfast   Influenza vac split trivalent PF  0.5 mL Intramuscular Tomorrow-1000   latanoprost   1 drop Both Eyes QHS   levothyroxine   125 mcg Oral QAC breakfast   melatonin  3 mg Oral QHS   nortriptyline   10 mg Oral QHS   rOPINIRole   1 mg Oral TID   Continuous Infusions: Diet: Diet Order             Diet Heart Room service appropriate? Yes; Fluid consistency: Thin  Diet effective now                    Data Reviewed: I have personally reviewed following labs and imaging studies ( see epic result tab) CBC: Recent Labs  Lab 01/11/24 0834 01/11/24 1853 01/12/24 0040  WBC 12.7* 12.2* 9.8  NEUTROABS 11.3*  --   --   HGB 12.4 11.2* 12.0  HCT 41.1 37.7 39.0  MCV 98.6 97.9 97.3  PLT 247 200 200   CMP: Recent Labs  Lab 01/11/24 0834 01/11/24 1853 01/12/24 0040  NA 134*  --  133*  K 5.4*  --  5.2*  CL 103  --  101  CO2 21*  --  20*  GLUCOSE 99  --  83  BUN 30*  --  26*  CREATININE 0.98 1.19* 1.15*  CALCIUM 8.0*  --  7.6*  MG  --  2.4  --   PHOS  --  2.6  --    GFR: Estimated Creatinine Clearance: 35.8 mL/min (A) (by C-G formula based on SCr of 1.15 mg/dL (H)). Recent Labs  Lab 01/11/24 0834  AST 17  ALT 7  ALKPHOS 101  BILITOT 0.5  PROT 7.3  ALBUMIN  3.9    Recent Labs  Lab 01/11/24 0834  LIPASE 15   No results for input(s): AMMONIA in the last 168 hours. Coagulation Profile: No results for input(s): INR, PROTIME in the last 168 hours. Unresulted Labs (From admission, onward)     Start     Ordered   01/13/24 0500  Basic metabolic panel with GFR  Daily,   R      01/12/24 0819   01/13/24 0500  CBC  Daily,   R      01/12/24 0819   01/11/24 0834  Urine Culture  Once,   URGENT       Question:  Indication  Answer:  Dysuria   01/11/24 0834            Antimicrobials/Microbiology: Anti-infectives (From admission, onward)    Start     Dose/Rate Route Frequency Ordered Stop   01/12/24 0000  cefTRIAXone (ROCEPHIN) 1 g in sodium chloride  0.9 % 100 mL IVPB  Status:  Discontinued        1 g 200 mL/hr over 30 Minutes Intravenous Every 24 hours 01/11/24 1640 01/11/24 1700   01/12/24 0000  azithromycin  (ZITHROMAX ) 500 mg in sodium chloride  0.9 % 250 mL IVPB  Status:  Discontinued        500 mg 250 mL/hr over 60 Minutes Intravenous Every 24 hours 01/11/24 1640 01/11/24 1700   01/11/24 0930  cefTRIAXone (ROCEPHIN) 1 g in sodium chloride  0.9 % 100 mL IVPB        1 g 200 mL/hr over 30 Minutes Intravenous  Once 01/11/24 0921 01/11/24 1045   01/11/24 0930  azithromycin  (ZITHROMAX ) 500 mg in sodium chloride  0.9 % 250 mL IVPB        500 mg 250 mL/hr over 60 Minutes Intravenous  Once 01/11/24 0921 01/11/24 1223         Component Value Date/Time   SDES  01/11/2024 0850    BLOOD LEFT ANTECUBITAL Performed at [facility], [address] [place], [place] [address]    SPECREQUEST  01/11/2024 0850    BOTTLES DRAWN AEROBIC ONLY Blood Culture results may not be optimal due to an inadequate volume of blood received in culture bottles Performed at [facility], [address] [place], [place] [address]    CULT  01/11/2024 0850    NO GROWTH < 24 HOURS Performed at [facility], [address] [place], [place] [address]    REPTSTATUS PENDING 01/11/2024 9149    Procedures:    Mennie LAMY, MD [facility] 01/12/2024, 11:52 AM

## 2024-01-13 DIAGNOSIS — I5031 Acute diastolic (congestive) heart failure: Secondary | ICD-10-CM | POA: Diagnosis not present

## 2024-01-13 DIAGNOSIS — Z515 Encounter for palliative care: Secondary | ICD-10-CM

## 2024-01-13 DIAGNOSIS — I493 Ventricular premature depolarization: Secondary | ICD-10-CM | POA: Diagnosis not present

## 2024-01-13 DIAGNOSIS — J9601 Acute respiratory failure with hypoxia: Secondary | ICD-10-CM | POA: Diagnosis not present

## 2024-01-13 DIAGNOSIS — A419 Sepsis, unspecified organism: Secondary | ICD-10-CM | POA: Diagnosis not present

## 2024-01-13 DIAGNOSIS — R6520 Severe sepsis without septic shock: Secondary | ICD-10-CM

## 2024-01-13 LAB — CBC
HCT: 34.2 % — ABNORMAL LOW (ref 36.0–46.0)
Hemoglobin: 10.4 g/dL — ABNORMAL LOW (ref 12.0–15.0)
MCH: 30.1 pg (ref 26.0–34.0)
MCHC: 30.4 g/dL (ref 30.0–36.0)
MCV: 98.8 fL (ref 80.0–100.0)
Platelets: 168 K/uL (ref 150–400)
RBC: 3.46 MIL/uL — ABNORMAL LOW (ref 3.87–5.11)
RDW: 13.9 % (ref 11.5–15.5)
WBC: 7.8 K/uL (ref 4.0–10.5)
nRBC: 0 % (ref 0.0–0.2)

## 2024-01-13 LAB — BASIC METABOLIC PANEL WITH GFR
Anion gap: 10 (ref 5–15)
BUN: 34 mg/dL — ABNORMAL HIGH (ref 8–23)
CO2: 19 mmol/L — ABNORMAL LOW (ref 22–32)
Calcium: 7 mg/dL — ABNORMAL LOW (ref 8.9–10.3)
Chloride: 103 mmol/L (ref 98–111)
Creatinine, Ser: 1.16 mg/dL — ABNORMAL HIGH (ref 0.44–1.00)
GFR, Estimated: 45 mL/min — ABNORMAL LOW (ref >60)
Glucose, Bld: 118 mg/dL — ABNORMAL HIGH (ref 70–99)
Potassium: 4.7 mmol/L (ref 3.5–5.1)
Sodium: 132 mmol/L — ABNORMAL LOW (ref 135–145)

## 2024-01-13 MED ORDER — METOPROLOL TARTRATE 25 MG PO TABS
12.5000 mg | ORAL_TABLET | Freq: Two times a day (BID) | ORAL | Status: DC
  Administered 2024-01-13 (×2): 12.5 mg via ORAL
  Filled 2024-01-13 (×2): qty 1

## 2024-01-13 NOTE — Plan of Care (Signed)
   Problem: Education: Goal: Knowledge of General Education information will improve Description Including pain rating scale, medication(s)/side effects and non-pharmacologic comfort measures Outcome: Progressing   Problem: Health Behavior/Discharge Planning: Goal: Ability to manage health-related needs will improve Outcome: Progressing

## 2024-01-13 NOTE — Progress Notes (Signed)
  Progress Note  Patient Name: RAMONDA GALYON Date of Encounter: 01/13/2024 [facility] Cardiologist: Gordy Bergamo, MD   Interval Summary   No events overnight. S/p 250cc yesterday due to positive orthostasis and she feels much better today.   Vital Signs Vitals:   01/12/24 2112 01/13/24 0400 01/13/24 0840 01/13/24 1214  BP: 133/60 (!) 120/48  (!) 114/58  Pulse: 83 (!) 41  61  Resp: 20 16 17    Temp: 98.8 F (37.1 C) 98.2 F (36.8 C)  98.7 F (37.1 C)  TempSrc: Oral Oral  Oral  SpO2: 94% 99%    Weight:      Height:        Intake/Output Summary (Last 24 hours) at 01/13/2024 1237 Last data filed at 01/13/2024 9374 Gross per 24 hour  Intake 350.69 ml  Output 300 ml  Net 50.69 ml      01/11/2024    8:59 PM 12/28/2023    1:23 PM 12/14/2023    8:15 AM  Last 3 Weights  Weight (lbs) 196 lb 3.4 oz -- 192 lb  Weight (kg) 89 kg -- 87.091 kg      Telemetry/ECG  Significant PVC burden with trigeminy - Personally Reviewed  Physical Exam  GEN: No acute distress.   Neck: No JVD Cardiac: RRR w/ intermittent ectopic bits, no murmurs, rubs, or gallops.  Respiratory: Clear to auscultation bilaterally. GI: Soft, nontender, non-distended  MS: No edema  Assessment & Plan  CAELEIGH PROHASKA is a 89 y.o. year-old female with history of HFpEF, pAF s/p DCCV 04/2022 on AC, COPD, OSA not on CPAP,  AAA repair in 12/2019, pseudoaneurysm sac repair of AAA 10/2022 that we were consulted for due to concern for ADHF. - Her symptoms were consistent with a viral PNA, rather than ADHF. S/p IV lasix  40 mg x 1 and 1.0L IVF. On 10/3, she was found to be orthostatic and received 250cc of IVF. TTE stable from prior with EF 65-70% with mod-severe AS.  - Feel much better today and appears euvolemic - Significant PVC burden on tele --> start metop tartrate 12.5 mg BID - Trop elevation likely demand from acute illness and mod-severe AS - Cont Eliquis  and dapa; hold Entresto  and spiro - Follow up with  Vascular outpatient regarding her AAA   For questions or updates, please contact [facility] Please consult [email or web address] for contact info under   Joelle VEAR Ren Donley, MD

## 2024-01-13 NOTE — Consult Note (Addendum)
 Palliative Medicine  Name: Lisa Mcclure Date: 01/13/2024 MRN: [id number]  DOB: 1933/02/27  Patient Care Team: Yolande Toribio MATSU, MD as PCP - General (Internal Medicine) Ladona Heinz, MD as PCP - Cardiology (Cardiology) Shellia Oh, MD as Consulting Physician (Pulmonary Disease) Morgan, Clayborne CROME, RN as [facility] Care Management Ladona Heinz, MD as Consulting Physician (Cardiology) Caleen Dirks, MD as Consulting Physician (Internal Medicine)    REASON FOR CONSULTATION: Lisa Mcclure is a 89 y.o. female with multiple medical problems including HFpEF, hypertension, A-fib on Eliquis , COPD, OSA not on CPAP, and severe AS.  Patient was admitted the hospital in 01/11/2024 with respiratory failure, thought to be possibly due to viral etiology.  However, she was started on antibiotics.  Palliative care was consulted to address goals.  SOCIAL HISTORY:     reports that she quit smoking about 39 years ago. Her smoking use included cigarettes. She started smoking about 49 years ago. She has a 2 pack-year smoking history. She has been exposed to tobacco smoke. She has never used smokeless tobacco. She reports current alcohol  use. She reports that she does not use drugs.  Patient is widowed.  Lives at home alone but has hired caregivers throughout the day.  She has a daughter who lives nearby and a son in [place] .  Patient had a variety of jobs including working as a Pensions consultant, working for a Research officer, trade union, [employer]  ADVANCE DIRECTIVES:  Not on file.  Reportedly, daughter is her HCPOA.  CODE STATUS: DNR  PAST MEDICAL HISTORY: Past Medical History:  Diagnosis Date   Acute renal failure superimposed on stage 3b chronic kidney disease (HCC) 06/15/2021   Anemia    h/o hemorrhoidal bleeding and blood transfusion   Aortic stenosis    Chronic diastolic CHF (congestive heart failure) (HCC)    Depression    Diverticulosis    DOE (dyspnea on  exertion)    Esophageal reflux    GERD (gastroesophageal reflux disease)    Hypothyroidism    Insomnia    LBP (low back pain)    OAB (overactive bladder)    Obesity    OSA (obstructive sleep apnea)    Osteoarthritis    Paroxysmal atrial fibrillation (HCC)    Rheumatoid arthritis(714.0)    Shoulder pain, bilateral    Unspecified essential hypertension    Venous insufficiency     PAST SURGICAL HISTORY:  Past Surgical History:  Procedure Laterality Date   ABDOMINAL AORTIC ENDOVASCULAR STENT GRAFT N/A 12/20/2019   Procedure: Aortogram including catheter selection of aorta and bilateral iliac arteriogram, Endovascular repair of infrarenal abdominal aortic aneurysm with bifurcated stent graft (26 mm x 14 x 12 main body, right bell bottom with a 20 mm x 10 cm piece, and left bell bottom with a 16 mm x 12 cm piece) ;  Surgeon: Gretta Lonni PARAS, MD;  Location: [facility] OR;  Service: Vascular;  Laterality: N/A;   APPENDECTOMY  1953   BIOPSY  06/18/2021   Procedure: BIOPSY;  Surgeon: Saintclair Jasper, MD;  Location: [facility] ENDOSCOPY;  Service: Gastroenterology;;   bladder abduction-1996  1996   breast biopsy Right 1980   CARDIOVERSION N/A 12/24/2019   Procedure: CARDIOVERSION;  Surgeon: Ladona Heinz, MD;  Location: [facility] ENDOSCOPY;  Service: Cardiovascular;  Laterality: N/A;   CARDIOVERSION N/A 04/25/2022   Procedure: CARDIOVERSION;  Surgeon: Ladona Heinz, MD;  Location: [facility] ENDOSCOPY;  Service: Cardiovascular;  Laterality: N/A;   CESAREAN SECTION  26, 69, 1964   COSMETIC SURGERY  1996   CYSTOCELE REPAIR     ESOPHAGOGASTRODUODENOSCOPY (EGD) WITH PROPOFOL  N/A 06/18/2021   Procedure: ESOPHAGOGASTRODUODENOSCOPY (EGD) WITH PROPOFOL ;  Surgeon: Saintclair Jasper, MD;  Location: [facility] ENDOSCOPY;  Service: Gastroenterology;  Laterality: N/A;   FLEXIBLE SIGMOIDOSCOPY N/A 04/10/2015   Procedure: FLEXIBLE SIGMOIDOSCOPY;  Surgeon: Elsie Cree, MD;  Location: [facility] ENDOSCOPY;  Service: Endoscopy;  Laterality: N/A;    INTRAMEDULLARY (IM) NAIL INTERTROCHANTERIC Left 02/03/2023   Procedure: LEFT INTRAMEDULLARY (IM) NAIL INTERTROCHANTERIC;  Surgeon: Vernetta Lonni GRADE, MD;  Location: [facility] OR;  Service: Orthopedics;  Laterality: Left;   IR AORTAGRAM ABDOMINAL SERIALOGRAM  10/26/2022   IR EMBO ARTERIAL NOT HEMORR HEMANG INC GUIDE ROADMAPPING  10/24/2022   IR RADIOLOGIST EVAL & MGMT  09/13/2022   IR RADIOLOGIST EVAL & MGMT  11/24/2022   IR US  GUIDE VASC ACCESS LEFT  10/26/2022   IR US  GUIDE VASC ACCESS LEFT  10/26/2022   IR US  GUIDE VASC ACCESS LEFT  10/26/2022   IR US  GUIDE VASC ACCESS LEFT  10/26/2022   knee arthroscopy Right 1996, 2010   KNEE ARTHROSCOPY W/ AUTOGENOUS CARTILAGE IMPLANTATION (ACI) PROCEDURE Left 1994, 1995   REFRACTIVE SURGERY  01/2020   SHOULDER SURGERY  1990   TEE WITHOUT CARDIOVERSION N/A 12/24/2019   Procedure: TRANSESOPHAGEAL ECHOCARDIOGRAM (TEE);  Surgeon: Ladona Heinz, MD;  Location: [facility] ENDOSCOPY;  Service: Cardiovascular;  Laterality: N/A;   TEE WITHOUT CARDIOVERSION N/A 06/23/2022   Procedure: TRANSESOPHAGEAL ECHOCARDIOGRAM (TEE);  Surgeon: Dewane Shiner, DO;  Location: [facility] ENDOSCOPY;  Service: Cardiovascular;  Laterality: N/A;   TOTAL ABDOMINAL HYSTERECTOMY  1972   ULTRASOUND GUIDANCE FOR VASCULAR ACCESS Bilateral 12/20/2019   Procedure: Ultrasound-guided access of bilateral common femoral arteries for delivery of endograft and percutaneous closure;  Surgeon: Gretta Lonni PARAS, MD;  Location: [facility] OR;  Service: Vascular;  Laterality: Bilateral;   VESICOVAGINAL FISTULA CLOSURE W/ TAH     WRIST SURGERY  1967    HEMATOLOGY/ONCOLOGY HISTORY:  Oncology History   No history exists.    ALLERGIES:  is allergic to gabapentin, methocarbamol , and statins.  MEDICATIONS:  Current Facility-Administered Medications  Medication Dose Route Frequency Provider Last Rate Last Admin   acetaminophen  (TYLENOL ) tablet 650 mg  650 mg Oral Q6H PRN Pahwani, Rinka R, MD   650 mg at 01/13/24 0844   Or    acetaminophen  (TYLENOL ) suppository 650 mg  650 mg Rectal Q6H PRN Pahwani, Rinka R, MD       apixaban  (ELIQUIS ) tablet 5 mg  5 mg Oral BID Pahwani, Rinka R, MD   5 mg at 01/13/24 0844   artificial tears ophthalmic solution 1 drop  1 drop Both Eyes QID PRN Kc, Mennie, MD       budesonide  (PULMICORT ) nebulizer solution 0.5 mg  0.5 mg Nebulization Daily Pahwani, Rinka R, MD   0.5 mg at 01/13/24 0840   busPIRone  (BUSPAR ) tablet 7.5 mg  7.5 mg Oral QHS Carolee Rosaline DASEN, RPH   7.5 mg at 01/12/24 2144   busPIRone  (BUSPAR ) tablet 7.5 mg  7.5 mg Oral Daily PRN Carolee Rosaline T, RPH       cefTRIAXone (ROCEPHIN) 1 g in sodium chloride  0.9 % 100 mL IVPB  1 g Intravenous Daily Kc, Ramesh, MD 200 mL/hr at 01/13/24 0858 1 g at 01/13/24 0858   cholecalciferol  (VITAMIN D3) 25 MCG (1000 UNIT) tablet 5,000 Units  5,000 Units Oral Daily Christobal Mennie, MD   5,000 Units at 01/13/24 0844   dapagliflozin   propanediol (FARXIGA ) tablet 10 mg  10 mg Oral QAC breakfast Pahwani, Rinka R, MD   10 mg at 01/13/24 0844   diclofenac  Sodium (VOLTAREN ) 1 % topical gel 2 g  2 g Topical TID PRN Christobal Guadalajara, MD       doxycycline  (VIBRA -TABS) tablet 100 mg  100 mg Oral Q12H Kc, Ramesh, MD   100 mg at 01/13/24 0844   Influenza vac split trivalent PF (FLUZONE  HIGH-DOSE) injection 0.5 mL  0.5 mL Intramuscular Tomorrow-1000 Pahwani, Rinka R, MD       latanoprost  (XALATAN ) 0.005 % ophthalmic solution 1 drop  1 drop Both Eyes QHS Kc, Ramesh, MD   1 drop at 01/12/24 2145   levothyroxine  (SYNTHROID ) tablet 125 mcg  125 mcg Oral QAC breakfast Pahwani, Rinka R, MD   125 mcg at 01/13/24 0705   lip balm (CARMEX) ointment 1 Application  1 Application Topical PRN Pahwani, Rinka R, MD       melatonin tablet 3 mg  3 mg Oral QHS Kc, Ramesh, MD   3 mg at 01/12/24 2144   methocarbamol  (ROBAXIN ) tablet 500 mg  500 mg Oral Q6H PRN Christobal Guadalajara, MD   500 mg at 01/13/24 0844   metoprolol  tartrate (LOPRESSOR ) tablet 12.5 mg  12.5 mg Oral BID Azobou Tonleu, Joelle DEL, MD        nortriptyline  (PAMELOR ) capsule 10 mg  10 mg Oral QHS Pahwani, Rinka R, MD   10 mg at 01/12/24 2144   ondansetron  (ZOFRAN ) tablet 4 mg  4 mg Oral Q6H PRN Pahwani, Rinka R, MD       Or   ondansetron  (ZOFRAN ) injection 4 mg  4 mg Intravenous Q6H PRN Pahwani, Rinka R, MD       oxyCODONE  (Oxy IR/ROXICODONE ) immediate release tablet 5 mg  5 mg Oral Q4H PRN Pahwani, Rinka R, MD   5 mg at 01/13/24 0335   polyethylene glycol (MIRALAX  / GLYCOLAX ) packet 17 g  17 g Oral Daily PRN Christobal Guadalajara, MD   17 g at 01/13/24 0852   rOPINIRole  (REQUIP ) tablet 1 mg  1 mg Oral TID Christobal Guadalajara, MD   1 mg at 01/13/24 0844    VITAL SIGNS: BP (!) 120/48 (BP Location: Left Arm)   Pulse (!) 41   Temp 98.2 F (36.8 C) (Oral)   Resp 17   Ht 5' 3 (1.6 m)   Wt 196 lb 3.4 oz (89 kg)   SpO2 99%   BMI 34.76 kg/m  Filed Weights   01/11/24 2059  Weight: 196 lb 3.4 oz (89 kg)    Estimated body mass index is 34.76 kg/m as calculated from the following:   Height as of this encounter: 5' 3 (1.6 m).   Weight as of this encounter: 196 lb 3.4 oz (89 kg).  LABS: CBC:    Component Value Date/Time   WBC 7.8 01/13/2024 0422   HGB 10.4 (L) 01/13/2024 0422   HGB 13.0 01/25/2023 1620   HCT 34.2 (L) 01/13/2024 0422   HCT 39.2 01/25/2023 1620   PLT 168 01/13/2024 0422   PLT 279 01/25/2023 1620   MCV 98.8 01/13/2024 0422   MCV 94 01/25/2023 1620   NEUTROABS 11.3 (H) 01/11/2024 0834   LYMPHSABS 0.2 (L) 01/11/2024 0834   MONOABS 0.8 01/11/2024 0834   EOSABS 0.4 01/11/2024 0834   BASOSABS 0.0 01/11/2024 0834   Comprehensive Metabolic Panel:    Component Value Date/Time   NA 132 (L) 01/13/2024 0422   NA  142 01/25/2023 1620   K 4.7 01/13/2024 0422   CL 103 01/13/2024 0422   CO2 19 (L) 01/13/2024 0422   BUN 34 (H) 01/13/2024 0422   BUN 29 (H) 01/25/2023 1620   CREATININE 1.16 (H) 01/13/2024 0422   CREATININE 0.98 (H) 01/29/2016 1528   GLUCOSE 118 (H) 01/13/2024 0422   CALCIUM 7.0 (L) 01/13/2024 0422   AST 17  01/11/2024 0834   ALT 7 01/11/2024 0834   ALKPHOS 101 01/11/2024 0834   BILITOT 0.5 01/11/2024 0834   PROT 7.3 01/11/2024 0834   ALBUMIN  3.9 01/11/2024 0834    RADIOGRAPHIC STUDIES: ECHOCARDIOGRAM COMPLETE Result Date: 01/12/2024    ECHOCARDIOGRAM REPORT   Patient Name:   RASHELLE IRELAND Levick Date of Exam: 01/12/2024 Medical Rec #:  [id number]         Height:       63.0 in Accession #:    [id number]        Weight:       196.2 lb Date of Birth:  [birth year]        BSA:          1.918 m Patient Age:    89 years          BP:           127/49 mmHg Patient Gender: F                 HR:           55 bpm. Exam Location:  Inpatient Procedure: 2D Echo, Cardiac Doppler, Color Doppler and 3D Echo (Both Spectral            and Color Flow Doppler were utilized during procedure). Indications:    CHF I50.31  History:        Patient has prior history of Echocardiogram examinations, most                 recent 06/19/2021. Cardiomegaly and CHF, Arrythmias:Atrial                 Fibrillation; Risk Factors:Hypertension and Sleep Apnea. H/O                 Rheumatoid arthritis, Chronic kidney disease, Sepsis, Moderate                 to severe Ao stenosis. AAA repair. Left atrial thrombus.  Sonographer:    BERNARDA ROCKS Referring Phys: [id number] VELNA SAUNDERS PAHWANI IMPRESSIONS  1. Left ventricular ejection fraction, by estimation, is 65 to 70%. The left ventricle has normal function. The left ventricle has no regional wall motion abnormalities. Left ventricular diastolic parameters are indeterminate.  2. Right ventricular systolic function is normal. The right ventricular size is normal.  3. Left atrial size was severely dilated.  4. Mild mitral valve regurgitation. Moderate to severe mitral annular calcification.  5. AV is thickened, calcified with restricted motion. Peak and mean gradients through the valve are 60 and 30 mm hg respectively AVA (VTI) is 0.9 cm2 Dimensionless index is 0.31 OVerall consistent with moderate to severe AS  COmpared to echo from 2024 mean gradient has increased (20 to 30 mm Hg). Aortic valve regurgitation is mild.  6. The inferior vena cava is dilated in size with <50% respiratory variability, suggesting right atrial pressure of 15 mmHg. FINDINGS  Left Ventricle: Left ventricular ejection fraction, by estimation, is 65 to 70%. The left ventricle has normal function. The left ventricle has no regional wall motion abnormalities. The  left ventricular internal cavity size was normal in size. There is  no left ventricular hypertrophy. Left ventricular diastolic parameters are indeterminate. Right Ventricle: The right ventricular size is normal. Right vetricular wall thickness was not assessed. Right ventricular systolic function is normal. Left Atrium: Left atrial size was severely dilated. Right Atrium: Right atrial size was normal in size. Pericardium: There is no evidence of pericardial effusion. Mitral Valve: There is moderate thickening of the mitral valve leaflet(s). Moderate to severe mitral annular calcification. Mild mitral valve regurgitation. MV peak gradient, 9.2 mmHg. The mean mitral valve gradient is 4.0 mmHg. Tricuspid Valve: The tricuspid valve is normal in structure. Tricuspid valve regurgitation is trivial. Aortic Valve: AV is thickened, calcified with restricted motion. Peak and mean gradients through the valve are 60 and 30 mm hg respectively AVA (VTI) is 0.9 cm2 Dimensionless index is 0.31 OVerall consistent with moderate to severe AS COmpared to echo from 2024 mean gradient has increased (20 to 30 mm Hg). Aortic valve regurgitation is mild. Aortic regurgitation PHT measures 377 msec. Aortic valve mean gradient measures 30.0 mmHg. Aortic valve peak gradient measures 60.8 mmHg. Aortic valve area, by VTI measures 0.88 cm. Pulmonic Valve: The pulmonic valve was not well visualized. Pulmonic valve regurgitation is not visualized. No evidence of pulmonic stenosis. Aorta: The aortic root and ascending aorta  are structurally normal, with no evidence of dilitation. Venous: The inferior vena cava is dilated in size with less than 50% respiratory variability, suggesting right atrial pressure of 15 mmHg. IAS/Shunts: No atrial level shunt detected by color flow Doppler.  LEFT VENTRICLE PLAX 2D LVIDd:         5.50 cm      Diastology LVIDs:         3.50 cm      LV e' medial:    5.55 cm/s LV PW:         1.00 cm      LV E/e' medial:  24.0 LV IVS:        0.90 cm      LV e' lateral:   6.31 cm/s LVOT diam:     1.90 cm      LV E/e' lateral: 21.1 LV SV:         72 LV SV Index:   38 LVOT Area:     2.84 cm  LV Volumes (MOD) LV vol d, MOD A2C: 130.0 ml LV vol d, MOD A4C: 116.0 ml LV vol s, MOD A2C: 40.9 ml LV vol s, MOD A4C: 50.7 ml LV SV MOD A2C:     89.1 ml LV SV MOD A4C:     116.0 ml LV SV MOD BP:      79.1 ml RIGHT VENTRICLE             IVC RV Basal diam:  3.50 cm     IVC diam: 2.20 cm RV S prime:     16.60 cm/s TAPSE (M-mode): 2.1 cm LEFT ATRIUM              Index        RIGHT ATRIUM           Index LA diam:        6.10 cm  3.18 cm/m   RA Area:     13.80 cm LA Vol (A2C):   79.0 ml  41.19 ml/m  RA Volume:   26.90 ml  14.03 ml/m LA Vol (A4C):   150.0 ml 78.21 ml/m LA Biplane Vol:  115.0 ml 59.96 ml/m  AORTIC VALVE                     PULMONIC VALVE AV Area (Vmax):    0.75 cm      PV Vmax:          1.03 m/s AV Area (Vmean):   0.69 cm      PV Peak grad:     4.2 mmHg AV Area (VTI):     0.88 cm      PR End Diast Vel: 1.40 msec AV Vmax:           390.00 cm/s AV Vmean:          252.000 cm/s AV VTI:            0.822 m AV Peak Grad:      60.8 mmHg AV Mean Grad:      30.0 mmHg LVOT Vmax:         103.00 cm/s LVOT Vmean:        61.500 cm/s LVOT VTI:          0.254 m LVOT/AV VTI ratio: 0.31 AI PHT:            377 msec  AORTA Ao Root diam: 3.50 cm Ao Asc diam:  3.50 cm MITRAL VALVE MV Area (PHT): 3.30 cm     SHUNTS MV Area VTI:   1.76 cm     Systemic VTI:  0.25 m MV Peak grad:  9.2 mmHg     Systemic Diam: 1.90 cm MV Mean grad:  4.0  mmHg MV Vmax:       1.52 m/s MV Vmean:      84.9 cm/s MV Decel Time: 230 msec MR Peak grad: 48.7 mmHg MR Vmax:      349.00 cm/s MV E velocity: 133.00 cm/s MV A velocity: 113.00 cm/s MV E/A ratio:  1.18 Vina Gull MD Electronically signed by Vina Gull MD Signature Date/Time: 01/12/2024/4:28:51 PM    Final    CT CHEST WO CONTRAST Result Date: 01/11/2024 EXAM: CT CHEST WITHOUT CONTRAST 01/11/2024 04:15:58 PM TECHNIQUE: CT of the chest was performed without the administration of intravenous contrast. Multiplanar reformatted images are provided for review. Automated exposure control, iterative reconstruction, and/or weight based adjustment of the mA/kV was utilized to reduce the radiation dose to as low as reasonably achievable. COMPARISON: Comparison exam 11/23/2023. CLINICAL HISTORY: Respiratory illness, nondiagnostic xray. Got up to let dog out then sat in living room and could not stop shaking, like rigors and chills. Has had it happen before but never extended that long. FINDINGS: MEDIASTINUM: Coronary artery calcification and aortic atherosclerotic calcification. Heart and pericardium are unremarkable. The central airways are clear. LYMPH NODES: No mediastinal, hilar or axillary lymphadenopathy. LUNGS AND PLEURA: Mild bibasilar atelectasis. Mild pleural thickening. No airspace disease. No pneumonia. No suspicious pulmonary nodules. No pleural effusion or pneumothorax. SOFT TISSUES/BONES: No acute abnormality of the bones or soft tissues. UPPER ABDOMEN: Limited images of the upper abdomen demonstrates no acute abnormality. IMPRESSION: 1. No acute intrathoracic findings. Electronically signed by: Norleen Boxer MD 01/11/2024 04:48 PM EDT RP Workstation: [id number]   CT ABDOMEN PELVIS W CONTRAST Result Date: 01/11/2024 CLINICAL DATA:  Left lower quadrant abdominal pain, chills and fever since this morning. Recently treated for a urinary tract infection. Pelvic pain. Shortness of breath. EXAM: CT ABDOMEN AND PELVIS  WITH CONTRAST TECHNIQUE: Multidetector CT imaging of the abdomen and pelvis was performed using the standard protocol following  bolus administration of intravenous contrast. RADIATION DOSE REDUCTION: This exam was performed according to the departmental dose-optimization program which includes automated exposure control, adjustment of the mA and/or kV according to patient size and/or use of iterative reconstruction technique. CONTRAST:  OMNIPAQUE  IOHEXOL  300 MG/ML  SOLN COMPARISON:  07/02/2022 FINDINGS: Lower chest: Atheromatous calcifications, including the coronary arteries and aorta. Dense mitral valve annulus calcifications. Mildly enlarged heart. No pericardial effusion. Mild-to-moderate dependent atelectasis at both lung bases. Hepatobiliary: No focal liver abnormality is seen. No gallstones, gallbladder wall thickening, or biliary dilatation. Pancreas: Moderate diffuse pancreatic atrophy. Spleen: Normal in size without focal abnormality. Adrenals/Urinary Tract: Adrenal glands are unremarkable. Kidneys are normal, without renal calculi, focal lesion, or hydronephrosis. Bladder is unremarkable. There is some air in the urinary bladder, compatible with recent catheterization. Stomach/Bowel: Scattered colonic diverticula, most numerous in the right colon and sigmoid colon, without evidence of diverticulitis. Surgically absent appendix. Unremarkable stomach and small bowel. Vascular/Lymphatic: Extensive atheromatous arterial calcifications. Aorto bi-iliac stent within a thrombosed infrarenal abdominal aortic aneurysm measuring 6.7 cm in maximum diameter. There are multiple interval embolization coils in the region of the previously demonstrated endoleak. There is a small amount of developing calcification within the thrombus on the right on image number 43/11, measuring 209 Hounsfield units in density and a small amount of developing curvilinear calcification within the thrombus posteriorly on the left on  image number 46/11. No enlarged lymph nodes. Reproductive: A 4.5 cm left adnexal simple appearing cyst measuring 7 Hounsfield units in density on image number 68/11 is unchanged other than a slight increase in size from 4.1 cm in maximum diameter previously. Surgically absent uterus and ovaries. Other: Large left inguinal hernia containing fat and small to moderate-sized right inguinal hernia containing fat. Musculoskeletal: Left hip fixation hardware. Lower thoracic spine degenerative changes. IMPRESSION: 1. No acute abnormality. 2. Colonic diverticulosis without evidence of diverticulitis. 3. 4.5 cm left adnexal simple appearing cyst, unchanged other than a slight increase in size from 4.1 cm in maximum diameter previously. Recommend follow-up pelvic ultrasound in 12 months. 4. Large left inguinal hernia containing fat and small to moderate-sized right inguinal hernia containing fat. 5. Aorto bi-iliac stent within a thrombosed infrarenal abdominal aortic aneurysm measuring 6.7 cm in maximum diameter, previously 5.9 cm. 6. Interval embolization coils in the region of the previously demonstrated endoleak with no residual leak seen. 7. Calcific coronary artery and aortic atherosclerosis. Aortic Atherosclerosis (ICD10-I70.0). Electronically Signed   By: Elspeth Bathe M.D.   On: 01/11/2024 11:52   DG Chest Portable 1 View Result Date: 01/11/2024 CLINICAL DATA:  Cough, shortness of breath, fever and chills EXAM: PORTABLE CHEST 1 VIEW COMPARISON:  11/22/2023 FINDINGS: Stable cardiac enlargement and aortic tortuosity. Prominent central pulmonary arteries. Stable chronic lung disease with bilateral pulmonary interstitial prominence and scattered bilateral pulmonary scarring. No overt pulmonary edema, pleural fluid or airspace consolidation. No pneumothorax. The visualized skeletal structures are unremarkable. IMPRESSION: Stable chronic lung disease with bilateral pulmonary interstitial prominence and scattered bilateral  pulmonary scarring. No acute findings. Electronically Signed   By: Marcey Moan M.D.   On: 01/11/2024 09:21    PERFORMANCE STATUS (ECOG) : 3 - Symptomatic, >50% confined to bed  Review of Systems Unless otherwise noted, a complete review of systems is negative.  Physical Exam General: NAD Pulmonary: Unlabored Extremities: no edema, no joint deformities Skin: no rashes Neurological: Weakness but otherwise nonfocal  IMPRESSION: Patient with multiple medical problems including HFpEF, A-fib on Eliquis , COPD, and severe AS.  She is admitted with respiratory failure presumed to be of viral etiology.  Today, patient reports that she is feeling improved.  She endorses less shortness of breath.  Says that she last had chills yesterday.  Had a long conversation with patient and daughter participated via phone.  At baseline, she lives at home alone with her dog, Mia (maltese-poodle).  Patient says that her ambulation is limited and she is mostly in the chair or bed.  She does have a walker, wheelchair, and has made her home handicapped accessible.  She has hired caregivers that are in and out of the home throughout the day and then she is alone at night.  Patient says this has been working well but she has made a deposit and is considering transitioning to assisted living.  She has not yet done that due to limitations with having her dog at a facility.  Patient says she plans to return home at time of discharge.  Patient is in agreement with current scope of treatment.  She says that she would like to treat the treatable but is not interested in overly aggressive care or surgeries at this point in her life.  She says she recognizes that she is likely nearing the end of her life at some point and would prioritize comfort and quality of life over duration.  Patient was interested in the idea of palliative care following her at home.  Patient confirms DNR/DNI.  She has advanced directives, although  these are not on file within the EMR.  She says her daughter would be her healthcare power of attorney.  Her son is also involved but he lives in [place] .  PLAN: -Continue current scope of treatment - Dispo: Home with home health.  Recommend community palliative care to follow - DNR/DNI    Time Total: 60 minutes  Visit consisted of counseling and education dealing with the complex and emotionally intense issues of symptom management and palliative care in the setting of serious and potentially life-threatening illness.Greater than 50%  of this time was spent counseling and coordinating care related to the above assessment and plan.  Signed by: Fonda Mower, PhD, NP-C

## 2024-01-13 NOTE — Progress Notes (Signed)
 PROGRESS NOTE Lisa Mcclure  FMW:[id number] DOB: Mar 04, 1933 DOA: 01/11/2024 PCP: Yolande Toribio MATSU, MD  Brief Narrative/Hospital Course: Lisa Mcclure is a 89 y.o. 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 PTA around 1:30 AM she had severe chills and could not stop shaking therefore she called EMS. She was recently diagnosed with a UTI and treated with oral antibiotics. She had lower abdominal pain that started earlier this morning that resolved in the ER. She Lives alone,Independent on daily life activities w/ walker and sometimes wheelchair for ambulation. In ZI:jqzampoz, tachycardic, tachypneic, BP 135/52, requiring 3 to 5 L of oxygen  via nasal cannula.  CBC shows leukocytosis of 12.7, H&H 12.4/41.1, PLT 247.  NA: 134, K: 5.4.  BUN 30, creatinine 0.98, GFR 55.  Lipase: WNL.  Lactic acid: WNL.  COVID flu RSV negative.  UA negative for infection.  Troponin BNP elevated- flat. Chest x-ray>>no pneumonia. CT abdomen/pelvis>>no acute abnormality. 4.5 cm left adnexal simple appearing cyst unchanged other than slight increase in size from 4.  Centimeter.  Recommend pelvic ultrasound in 12 months. Large left inguinal hernia containing fat and small to moderate size right inguinal hernia containing fat.  Aortobiiliac stent within thrombosed infrarenal abdominal aortic aneurysm measuring 6.7 and maximum diameter previously 5.9.  EDP discussed with vascular surgery Dr. Ishmael who recommended outpatient follow-up with Dr. Thomas.  Patient was placed on antibiotics, CT chest was ordered and admitted. Ct chest - NAD  Subjective: Seen and examined Resting comfortably on the bedside  chair No more chills slept well has no complaint Overnight afebrile BP stable heart rate at x 40s Labs reviewed bicarb slightly low otherwise stable renal function and CBC hemoglobin 10.4 with chronic anemia  Assessment and plan:  Acute hypoxemic respiratory failure Cough/congestin Question viral illness Elevated but flat troponin likely demand ischemia-: Unclear etiology,suspect multifactorial question viral illness patient has been having cough and cold for few weeks and recently treated with UTI, also has component of CHF. Chest x-ray and CT chest negative for any acute findings S/ P Rocephin and azithromycin  in the ED. Continue antibiotics empirically for now pending further culture report.  Blood culture no growth so far,Pro-Cal is slightly elevated. COVID flu RSV negative.UA negative.   Echo shows EF 65 to 70%, RV size function normal AV is thickened calcified with restricted motion moderate to severe AS mean gradient has increased from 20-30 mm hg compared to 2024.  Soft blood pressure CHpEF Severe aortic stenosis Status post AAA repair and AAA pseudoaneurysm repair: Followed by cardiology and vascular surgery outpatient. CT abdomen shows increasing size of AAA from 5.9 to 6.7 cm.  EDP discussed with on-call vascular surgery Dr. Ishmael who recommended outpatient follow-up with Dr. Thomas.  Previously she had wished for DNR, palliative consulted. GDMT on hold-likely has improved.  Paroxysmal A-fib: Continue Eliquis .  Monitor closely on telemetry.   Left ventricular thrombus: Cont Eliquis    Hypothyroidism:  Continue levothyroxine    OSA: Not on CPAP. has oxygen  at home however she does not use it regularly   Hyperkalemia Hyponatremia Mild metabolic acidosis: Potassium improved following Lokelma, sodium and bicarb on slightly lower side.  Monitor    CKD stage IIIa: Stable.  Continue to monitor  COPD: No wheezing noted on exam.  Will continue home inhalers.    Anxiety: Continue BuSpar    Left adnexal cyst:  Per CT report slightly increase in size from 4.1 cm to 4.5 cm.  Radiology recommended follow-up pelvic ultrasound in 12 months   Large left inguinal hernia: Noted on CT abdomen.  No sign of obstruction   Low back pain: Continue pain control patient reports taking Norco at home   [place] Obesity w/ Body mass index is 34.76 kg/m.: Will benefit with PCP follow-up, weight loss,healthy lifestyle   Mobility: PT Orders: Active PT Follow up Rec: Home Health Pt10/06/2023 1341   DVT prophylaxis: apixaban  (ELIQUIS ) tablet 5 mg Start: 01/11/24 2330 SCDs Start: 01/11/24 1402 Code Status:   Code Status: Limited: Do not attempt resuscitation (DNR) -DNR-LIMITED -Do Not Intubate/DNI  Family Communication: plan of care discussed with patient/ no family at bedside. Patient status is: Remains hospitalized because of severity of illness Level of care: Progressive> transferred to telemetry  Dispo: The patient is from: home            Anticipated disposition: Anticipate discharge tomorrow if remains stable  Objective: Vitals last 24 hrs: Vitals:   01/12/24 1629 01/12/24 2112 01/13/24 0400 01/13/24 0840  BP: 128/63 133/60 (!) 120/48   Pulse: 76 83 (!) 41   Resp:  20 16 17   Temp: 97.7 F (36.5 C) 98.8 F (37.1 C) 98.2 F (36.8 C)   TempSrc: Oral Oral Oral   SpO2: 100% 94% 99%   Weight:      Height:        Physical Examination: General exam: AAOX3 elderly, obsese HEENT:Oral mucosa moist, Ear/Nose WNL grossly Respiratory system: B/L Clear BS,no use of accessory muscle Cardiovascular system: S1 & S2 +, No JVD. Gastrointestinal system: Abdomen soft,NT,ND, BS+ Nervous System: Alert, awake, moving all extremities,and following commands. Extremities: extremities warm, leg edema trace Skin: No rashes,no icterus. MSK: Normal muscle bulk,tone, power   Medications reviewed:  Scheduled Meds:  apixaban   5 mg Oral BID   budesonide   0.5 mg  Nebulization Daily   busPIRone   7.5 mg Oral QHS   cholecalciferol   5,000 Units Oral Daily   dapagliflozin  propanediol  10 mg Oral QAC breakfast   doxycycline   100 mg Oral Q12H   Influenza vac split trivalent PF  0.5 mL Intramuscular Tomorrow-1000   latanoprost   1 drop Both Eyes QHS   levothyroxine   125 mcg Oral QAC breakfast   melatonin  3 mg Oral QHS   nortriptyline   10 mg Oral QHS   rOPINIRole   1 mg Oral TID   Continuous Infusions:  cefTRIAXone (ROCEPHIN)  IV 1 g (01/13/24 0858)   Diet: Diet Order             Diet Heart Room service appropriate? Yes; Fluid consistency: Thin  Diet effective now                    Data Reviewed: I have personally reviewed following labs and imaging studies ( see epic result tab) CBC: Recent Labs  Lab 01/11/24 0834 01/11/24 1853 01/12/24 0040 01/13/24 0422  WBC 12.7* 12.2* 9.8 7.8  NEUTROABS 11.3*  --   --   --   HGB 12.4 11.2* 12.0 10.4*  HCT 41.1 37.7 39.0 34.2*  MCV 98.6 97.9 97.3 98.8  PLT 247 200 200 168   CMP: Recent Labs  Lab 01/11/24 0834 01/11/24 1853 01/12/24 0040 01/13/24 0422  NA 134*  --  133*  132*  K 5.4*  --  5.2* 4.7  CL 103  --  101 103  CO2 21*  --  20* 19*  GLUCOSE 99  --  83 118*  BUN 30*  --  26* 34*  CREATININE 0.98 1.19* 1.15* 1.16*  CALCIUM 8.0*  --  7.6* 7.0*  MG  --  2.4  --   --   PHOS  --  2.6  --   --    GFR: Estimated Creatinine Clearance: 35.5 mL/min (A) (by C-G formula based on SCr of 1.16 mg/dL (H)). Recent Labs  Lab 01/11/24 0834  AST 17  ALT 7  ALKPHOS 101  BILITOT 0.5  PROT 7.3  ALBUMIN  3.9    Recent Labs  Lab 01/11/24 0834  LIPASE 15   No results for input(s): AMMONIA in the last 168 hours. Coagulation Profile: No results for input(s): INR, PROTIME in the last 168 hours. Unresulted Labs (From admission, onward)     Start     Ordered   01/13/24 0500  Basic metabolic panel with GFR  Daily,   R      01/12/24 0819   01/13/24 0500  CBC  Daily,   R      01/12/24 0819            Antimicrobials/Microbiology: Anti-infectives (From admission, onward)    Start     Dose/Rate Route Frequency Ordered Stop   01/12/24 1245  doxycycline  (VIBRA -TABS) tablet 100 mg        100 mg Oral Every 12 hours 01/12/24 1154     01/12/24 1215  cefTRIAXone (ROCEPHIN) 1 g in sodium chloride  0.9 % 100 mL IVPB        1 g 200 mL/hr over 30 Minutes Intravenous Daily 01/12/24 1154 01/15/24 0959   01/12/24 0000  cefTRIAXone (ROCEPHIN) 1 g in sodium chloride  0.9 % 100 mL IVPB  Status:  Discontinued        1 g 200 mL/hr over 30 Minutes Intravenous Every 24 hours 01/11/24 1640 01/11/24 1700   01/12/24 0000  azithromycin  (ZITHROMAX ) 500 mg in sodium chloride  0.9 % 250 mL IVPB  Status:  Discontinued        500 mg 250 mL/hr over 60 Minutes Intravenous Every 24 hours 01/11/24 1640 01/11/24 1700   01/11/24 0930  cefTRIAXone (ROCEPHIN) 1 g in sodium chloride  0.9 % 100 mL IVPB        1 g 200 mL/hr over 30 Minutes Intravenous  Once 01/11/24 0921 01/11/24 1045   01/11/24 0930  azithromycin  (ZITHROMAX ) 500 mg in sodium chloride  0.9 % 250 mL IVPB        500 mg 250 mL/hr over 60 Minutes Intravenous  Once 01/11/24 0921 01/11/24 1223         Component Value Date/Time   SDES  01/11/2024 1230    URINE, CLEAN CATCH Performed at [facility], [address] [place], [place] [address]    SPECREQUEST  01/11/2024 1230    NONE Performed at [facility], [address] [place], [place] [address]    CULT MULTIPLE SPECIES PRESENT, SUGGEST RECOLLECTION (A) 01/11/2024 1230   REPTSTATUS 01/12/2024 FINAL 01/11/2024 1230    Procedures:    Lisa LAMY, MD [facility] 01/13/2024, 10:22 AM

## 2024-01-13 NOTE — Plan of Care (Signed)
  Problem: Clinical Measurements: Goal: Ability to maintain clinical measurements within normal limits will improve Outcome: Progressing   Problem: Clinical Measurements: Goal: Will remain free from infection Outcome: Progressing   Problem: Clinical Measurements: Goal: Diagnostic test results will improve Outcome: Progressing   Problem: Clinical Measurements: Goal: Respiratory complications will improve Outcome: Progressing   

## 2024-01-14 DIAGNOSIS — I5031 Acute diastolic (congestive) heart failure: Secondary | ICD-10-CM | POA: Diagnosis not present

## 2024-01-14 DIAGNOSIS — J9601 Acute respiratory failure with hypoxia: Secondary | ICD-10-CM | POA: Diagnosis not present

## 2024-01-14 DIAGNOSIS — I493 Ventricular premature depolarization: Secondary | ICD-10-CM | POA: Diagnosis not present

## 2024-01-14 LAB — BASIC METABOLIC PANEL WITH GFR
Anion gap: 8 (ref 5–15)
Anion gap: 16 — ABNORMAL HIGH (ref 5–15)
BUN: 32 mg/dL — ABNORMAL HIGH (ref 8–23)
BUN: 32 mg/dL — ABNORMAL HIGH (ref 8–23)
CO2: 23 mmol/L (ref 22–32)
CO2: 16 mmol/L — ABNORMAL LOW (ref 22–32)
Calcium: 7.4 mg/dL — ABNORMAL LOW (ref 8.9–10.3)
Calcium: 7.7 mg/dL — ABNORMAL LOW (ref 8.9–10.3)
Chloride: 102 mmol/L (ref 98–111)
Chloride: 101 mmol/L (ref 98–111)
Creatinine, Ser: 1.03 mg/dL — ABNORMAL HIGH (ref 0.44–1.00)
Creatinine, Ser: 1.17 mg/dL — ABNORMAL HIGH (ref 0.44–1.00)
GFR, Estimated: 52 mL/min — ABNORMAL LOW (ref >60)
GFR, Estimated: 45 mL/min — ABNORMAL LOW (ref >60)
Glucose, Bld: 92 mg/dL (ref 70–99)
Glucose, Bld: 103 mg/dL — ABNORMAL HIGH (ref 70–99)
Potassium: 5.9 mmol/L — ABNORMAL HIGH (ref 3.5–5.1)
Potassium: 5.7 mmol/L — ABNORMAL HIGH (ref 3.5–5.1)
Sodium: 133 mmol/L — ABNORMAL LOW (ref 135–145)
Sodium: 134 mmol/L — ABNORMAL LOW (ref 135–145)

## 2024-01-14 LAB — CBC
HCT: 36.8 % (ref 36.0–46.0)
Hemoglobin: 10.4 g/dL — ABNORMAL LOW (ref 12.0–15.0)
MCH: 29 pg (ref 26.0–34.0)
MCHC: 28.3 g/dL — ABNORMAL LOW (ref 30.0–36.0)
MCV: 102.5 fL — ABNORMAL HIGH (ref 80.0–100.0)
Platelets: 196 K/uL (ref 150–400)
RBC: 3.59 MIL/uL — ABNORMAL LOW (ref 3.87–5.11)
RDW: 13.8 % (ref 11.5–15.5)
WBC: 9.1 K/uL (ref 4.0–10.5)
nRBC: 0 % (ref 0.0–0.2)

## 2024-01-14 MED ORDER — METOPROLOL SUCCINATE ER 25 MG PO TB24
25.0000 mg | ORAL_TABLET | Freq: Every day | ORAL | Status: DC
  Administered 2024-01-14 – 2024-01-16 (×3): 25 mg via ORAL
  Filled 2024-01-14 (×3): qty 1

## 2024-01-14 MED ORDER — SODIUM ZIRCONIUM CYCLOSILICATE 10 G PO PACK
10.0000 g | PACK | Freq: Every day | ORAL | Status: DC
Start: 2024-01-14 — End: 2024-01-16
  Administered 2024-01-14 – 2024-01-16 (×3): 10 g via ORAL
  Filled 2024-01-14 (×3): qty 1

## 2024-01-14 MED ORDER — SODIUM ZIRCONIUM CYCLOSILICATE 10 G PO PACK
10.0000 g | PACK | Freq: Once | ORAL | Status: AC
  Administered 2024-01-14: 10 g via ORAL
  Filled 2024-01-14: qty 1

## 2024-01-14 MED ORDER — SODIUM ZIRCONIUM CYCLOSILICATE 10 G PO PACK
10.0000 g | PACK | Freq: Every day | ORAL | Status: DC
Start: 2024-01-14 — End: 2024-01-14

## 2024-01-14 NOTE — Progress Notes (Signed)
  Progress Note  Patient Name: Lisa Mcclure Date of Encounter: 01/14/2024 [facility] Cardiologist: Gordy Bergamo, MD   Interval Summary   No events overnight. She feels a lot better today.   Vital Signs Vitals:   01/13/24 0840 01/13/24 1214 01/13/24 1954 01/14/24 0511  BP:  (!) 114/58 (!) 128/59 (!) 125/58  Pulse:  61 (!) 101 (!) 55  Resp: 17  20 16   Temp:  98.7 F (37.1 C) 98 F (36.7 C) 97.7 F (36.5 C)  TempSrc:  Oral Oral Oral  SpO2:   92% 99%  Weight:      Height:       No intake or output data in the 24 hours ending 01/14/24 0629    01/11/2024    8:59 PM 12/28/2023    1:23 PM 12/14/2023    8:15 AM  Last 3 Weights  Weight (lbs) 196 lb 3.4 oz -- 192 lb  Weight (kg) 89 kg -- 87.091 kg      Telemetry/ECG  Miminal PVC burden - Personally Reviewed  Physical Exam  GEN: No acute distress.   Neck: No JVD Cardiac: RRR, no murmurs, rubs, or gallops.  Respiratory: Clear to auscultation bilaterally. GI: Soft, nontender, non-distended  MS: No edema  Assessment & Plan  Lisa Mcclure is a 89 y.o. year-old female with history of HFpEF, pAF s/p DCCV 04/2022 on AC, COPD, OSA not on CPAP,  AAA repair in 12/2019, pseudoaneurysm sac repair of AAA 10/2022 that we were consulted for due to concern for ADHF. - Her symptoms were consistent with a viral PNA, rather than ADHF. S/p IV lasix  40 mg x 1 and 1.0L IVF. On 10/3, she was found to be orthostatic and received 250cc of IVF. TTE stable from prior with EF 65-70% with mod-severe AS. Trop elevation likely demand from acute illness and mod-severe AS - Transition to metop XL 25 mg daily for PVC - Cont Eliquis  and dapagliflozin ; defer rest of GDMT to outpatient - Follow up with Vascular outpatient regarding her AAA - We will sign off; feel free to call us  back if you have any questions    For questions or updates, please contact [facility] Please consult [email or web address] for contact info under   Signed, Lisa Mcclure Lisa Donley, MD

## 2024-01-14 NOTE — Plan of Care (Signed)
   Problem: Education: Goal: Knowledge of General Education information will improve Description: Including pain rating scale, medication(s)/side effects and non-pharmacologic comfort measures Outcome: Progressing   Problem: Health Behavior/Discharge Planning: Goal: Ability to manage health-related needs will improve Outcome: Progressing   Problem: Clinical Measurements: Goal: Will remain free from infection Outcome: Progressing

## 2024-01-14 NOTE — Progress Notes (Signed)
 PROGRESS NOTE Lisa Mcclure  FMW:[id number] DOB: 1932/06/29 DOA: 01/11/2024 PCP: Yolande Toribio MATSU, MD  Brief Narrative/Hospital Course: Lisa Mcclure is a 89 y.o. 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 PTA around 1:30 AM she had severe chills and could not stop shaking therefore she called EMS. She was recently diagnosed with a UTI and treated with oral antibiotics. She had lower abdominal pain that started earlier this morning that resolved in the ER. She Lives alone,Independent on daily life activities w/ walker and sometimes wheelchair for ambulation. In ZI:jqzampoz, tachycardic, tachypneic, BP 135/52, requiring 3 to 5 L of oxygen  via nasal cannula.  CBC shows leukocytosis of 12.7, H&H 12.4/41.1, PLT 247.  NA: 134, K: 5.4.  BUN 30, creatinine 0.98, GFR 55.  Lipase: WNL.  Lactic acid: WNL.  COVID flu RSV negative.  UA negative for infection.  Troponin BNP elevated- flat. Chest x-ray>>no pneumonia. CT abdomen/pelvis>>no acute abnormality. 4.5 cm left adnexal simple appearing cyst unchanged other than slight increase in size from 4.  Centimeter.  Recommend pelvic ultrasound in 12 months. Large left inguinal hernia containing fat and small to moderate size right inguinal hernia containing fat.  Aortobiiliac stent within thrombosed infrarenal abdominal aortic aneurysm measuring 6.7 and maximum diameter previously 5.9.  EDP discussed with vascular surgery Dr. Ishmael who recommended outpatient follow-up with Dr. Thomas.  Patient was placed on antibiotics, CT chest was ordered and admitted. Ct chest - NAD  Subjective: Seen and examined No new complaints Overnight  afebrile BP stable, labs with potassium of 5.9 BUN/creatinine stable Lokelma  and recheck BMP ordered and k still high  Assessment and plan:  Acute hypoxemic respiratory failure Cough/congestin Question viral illness Elevated but flat troponin likely demand ischemia-: Unclear etiology,suspect multifactorial question viral illness patient has been having cough and cold for few weeks and recently treated with UTI, also has component of CHF. CXR/CT chest negative for any acute findings. COVID flu RSV negative.UA negative.    Managing empirically with antibiotics, blood culture no growth so far,Pro-Cal is slightly elevated. Echo shows EF 65 to 70%, RV size function normal AV is thickened calcified with restricted motion moderate to severe AS mean gradient has increased from 20-30 mm hg compared to 2024.  Soft blood pressure CHpEF Severe aortic stenosis Status post AAA repair and AAA pseudoaneurysm repair: Followed by cardiology and vascular surgery outpatient. CT abdomen shows increasing size of AAA from 5.9 to 6.7 cm.  EDP discussed with on-call vascular surgery Dr. Magda who recommended outpatient follow-up with Dr. Thomas. Previously she had wished for DNR, palliative consulted. GDMT on hold  Paroxysmal A-fib: Continue Eliquis  and tele   Left ventricular thrombus: Cont Eliquis    Continue hypothyroidism:  Continue levothyroxine    OSA: Not on CPAP. Has oxygen  at home however she does not use it regularly   Hyperkalemia Hyponatremia Mild metabolic acidosis: Potassium improved following Lokelma but high again- will do lokelma again, add renal diet, monitor labs Recent Labs  Lab 01/11/24 0834 01/11/24 1853 01/12/24 0040 01/13/24 0422 01/14/24 0727 01/14/24 1005  K 5.4*  --  5.2* 4.7 5.9* 5.7*  CALCIUM 8.0*  --  7.6* 7.0* 7.4* 7.7*  MG  --  2.4  --   --   --   --  PHOS  --  2.6  --   --   --   --     No results found for: PTH  CKD stage IIIa: Stable.Continue to  monitor   COPD: No wheezing noted on exam.  Will continue home inhalers.   Anxiety: Continue BuSpar    Left adnexal cyst: Per CT report slightly increase in size from 4.1 cm to 4.5 cm.  Radiology recommended follow-up pelvic ultrasound in 12 months   Large left inguinal hernia: Noted on CT abdomen.  No sign of obstruction   Low back pain: Continue pain control patient reports taking Norco at home   [place] I Obesity w/ Body mass index is 34.76 kg/m.: Will benefit with PCP follow-up, weight loss,healthy lifestyle   Mobility: PT Orders: Active PT Follow up Rec: Home Health Pt10/06/2023 1341   DVT prophylaxis: apixaban  (ELIQUIS ) tablet 5 mg Start: 01/11/24 2330 SCDs Start: 01/11/24 1402 Code Status:   Code Status: Limited: Do not attempt resuscitation (DNR) -DNR-LIMITED -Do Not Intubate/DNI  Family Communication: plan of care discussed with patient/ no family at bedside. Patient status is: Remains hospitalized because of severity of illness Level of care: Telemetry> transferred to telemetry  Dispo: The patient is from: home            Anticipated disposition: TBD Objective: Vitals last 24 hrs: Vitals:   01/13/24 1214 01/13/24 1954 01/14/24 0511 01/14/24 1006  BP: (!) 114/58 (!) 128/59 (!) 125/58 138/65  Pulse: 61 (!) 101 (!) 55 60  Resp:  20 16   Temp: 98.7 F (37.1 C) 98 F (36.7 C) 97.7 F (36.5 C)   TempSrc: Oral Oral Oral   SpO2:  92% 99%   Weight:      Height:        Physical Examination: General exam: AAOX3, pleasant. HEENT:Oral mucosa moist, Ear/Nose WNL grossly Respiratory system: CTA B/L,no use of accessory muscle Cardiovascular system: S1 & S2 +, No JVD. Gastrointestinal system: Abdomen soft,NT,ND, BS+ Nervous System: Alert, awake, moving all extremities,and following commands. Extremities: extremities warm, leg edema trace Skin: No rashes,no icterus. MSK: Normal muscle bulk,tone, power   Medications reviewed:  Scheduled Meds:  apixaban   5 mg Oral BID    budesonide   0.5 mg Nebulization Daily   busPIRone   7.5 mg Oral QHS   cholecalciferol   5,000 Units Oral Daily   dapagliflozin  propanediol  10 mg Oral QAC breakfast   doxycycline   100 mg Oral Q12H   latanoprost   1 drop Both Eyes QHS   levothyroxine   125 mcg Oral QAC breakfast   melatonin  3 mg Oral QHS   metoprolol  succinate  25 mg Oral Daily   nortriptyline   10 mg Oral QHS   rOPINIRole   1 mg Oral TID   Continuous Infusions:   Diet: Diet Order             Diet renal with fluid restriction Fluid restriction: 1500 mL Fluid; Room service appropriate? Yes; Fluid consistency: Thin  Diet effective now                    Data Reviewed: I have personally reviewed following labs and imaging studies ( see epic result tab) CBC: Recent Labs  Lab 01/11/24 0834 01/11/24 1853 01/12/24 0040 01/13/24 0422 01/14/24 0452  WBC 12.7* 12.2* 9.8 7.8 9.1  NEUTROABS 11.3*  --   --   --   --   HGB 12.4 11.2* 12.0 10.4* 10.4*  HCT 41.1 37.7 39.0 34.2* 36.8  MCV  98.6 97.9 97.3 98.8 102.5*  PLT 247 200 200 168 196   CMP: Recent Labs  Lab 01/11/24 0834 01/11/24 1853 01/12/24 0040 01/13/24 0422 01/14/24 0727 01/14/24 1005  NA 134*  --  133* 132* 133* 134*  K 5.4*  --  5.2* 4.7 5.9* 5.7*  CL 103  --  101 103 102 101  CO2 21*  --  20* 19* 23 16*  GLUCOSE 99  --  83 118* 92 103*  BUN 30*  --  26* 34* 32* 32*  CREATININE 0.98 1.19* 1.15* 1.16* 1.03* 1.17*  CALCIUM 8.0*  --  7.6* 7.0* 7.4* 7.7*  MG  --  2.4  --   --   --   --   PHOS  --  2.6  --   --   --   --    GFR: Estimated Creatinine Clearance: 35.2 mL/min (A) (by C-G formula based on SCr of 1.17 mg/dL (H)). Recent Labs  Lab 01/11/24 0834  AST 17  ALT 7  ALKPHOS 101  BILITOT 0.5  PROT 7.3  ALBUMIN  3.9    Recent Labs  Lab 01/11/24 0834  LIPASE 15   No results for input(s): AMMONIA in the last 168 hours. Coagulation Profile: No results for input(s): INR, PROTIME in the last 168 hours. Unresulted Labs (From  admission, onward)     Start     Ordered   01/13/24 0500  Basic metabolic panel with GFR  Daily,   R      01/12/24 0819   01/13/24 0500  CBC  Daily,   R      01/12/24 0819           Antimicrobials/Microbiology: Anti-infectives (From admission, onward)    Start     Dose/Rate Route Frequency Ordered Stop   01/12/24 1245  doxycycline  (VIBRA -TABS) tablet 100 mg        100 mg Oral Every 12 hours 01/12/24 1154     01/12/24 1215  cefTRIAXone (ROCEPHIN) 1 g in sodium chloride  0.9 % 100 mL IVPB        1 g 200 mL/hr over 30 Minutes Intravenous Daily 01/12/24 1154 01/14/24 1046   01/12/24 0000  cefTRIAXone (ROCEPHIN) 1 g in sodium chloride  0.9 % 100 mL IVPB  Status:  Discontinued        1 g 200 mL/hr over 30 Minutes Intravenous Every 24 hours 01/11/24 1640 01/11/24 1700   01/12/24 0000  azithromycin  (ZITHROMAX ) 500 mg in sodium chloride  0.9 % 250 mL IVPB  Status:  Discontinued        500 mg 250 mL/hr over 60 Minutes Intravenous Every 24 hours 01/11/24 1640 01/11/24 1700   01/11/24 0930  cefTRIAXone (ROCEPHIN) 1 g in sodium chloride  0.9 % 100 mL IVPB        1 g 200 mL/hr over 30 Minutes Intravenous  Once 01/11/24 0921 01/11/24 1045   01/11/24 0930  azithromycin  (ZITHROMAX ) 500 mg in sodium chloride  0.9 % 250 mL IVPB        500 mg 250 mL/hr over 60 Minutes Intravenous  Once 01/11/24 0921 01/11/24 1223         Component Value Date/Time   SDES  01/11/2024 1230    URINE, CLEAN CATCH Performed at [facility], [address] [place], [place] [address]    SPECREQUEST  01/11/2024 1230    NONE Performed at [facility], [address], [place], [place] [address]    CULT MULTIPLE  SPECIES PRESENT, SUGGEST RECOLLECTION (A) 01/11/2024 1230   REPTSTATUS 01/12/2024 FINAL 01/11/2024 1230    Procedures: Mennie LAMY, MD [facility] 01/14/2024, 1:57 PM

## 2024-01-15 DIAGNOSIS — J9601 Acute respiratory failure with hypoxia: Secondary | ICD-10-CM | POA: Diagnosis not present

## 2024-01-15 LAB — CBC
HCT: 35.6 % — ABNORMAL LOW (ref 36.0–46.0)
Hemoglobin: 10.8 g/dL — ABNORMAL LOW (ref 12.0–15.0)
MCH: 29.5 pg (ref 26.0–34.0)
MCHC: 30.3 g/dL (ref 30.0–36.0)
MCV: 97.3 fL (ref 80.0–100.0)
Platelets: 224 K/uL (ref 150–400)
RBC: 3.66 MIL/uL — ABNORMAL LOW (ref 3.87–5.11)
RDW: 13.7 % (ref 11.5–15.5)
WBC: 8 K/uL (ref 4.0–10.5)
nRBC: 0 % (ref 0.0–0.2)

## 2024-01-15 LAB — BASIC METABOLIC PANEL WITH GFR
Anion gap: 8 (ref 5–15)
BUN: 32 mg/dL — ABNORMAL HIGH (ref 8–23)
CO2: 23 mmol/L (ref 22–32)
Calcium: 8 mg/dL — ABNORMAL LOW (ref 8.9–10.3)
Chloride: 103 mmol/L (ref 98–111)
Creatinine, Ser: 1.01 mg/dL — ABNORMAL HIGH (ref 0.44–1.00)
GFR, Estimated: 53 mL/min — ABNORMAL LOW (ref >60)
Glucose, Bld: 93 mg/dL (ref 70–99)
Potassium: 5.6 mmol/L — ABNORMAL HIGH (ref 3.5–5.1)
Sodium: 133 mmol/L — ABNORMAL LOW (ref 135–145)

## 2024-01-15 LAB — LACTATE DEHYDROGENASE: LDH: 194 U/L — ABNORMAL HIGH (ref 98–192)

## 2024-01-15 LAB — BILIRUBIN, TOTAL: Total Bilirubin: 0.3 mg/dL (ref 0.0–1.2)

## 2024-01-15 NOTE — Progress Notes (Signed)
 PROGRESS NOTE Lisa Mcclure  FMW:[id number] DOB: 19-Mar-1933 DOA: 01/11/2024 PCP: Yolande Toribio MATSU, MD  Brief Narrative/Hospital Course: Lisa Mcclure is a 89 y.o. 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 PTA around 1:30 AM she had severe chills and could not stop shaking therefore she called EMS. She was recently diagnosed with a UTI and treated with oral antibiotics. She had lower abdominal pain that started earlier this morning that resolved in the ER. She Lives alone,Independent on daily life activities w/ walker and sometimes wheelchair for ambulation. In ZI:jqzampoz, tachycardic, tachypneic, BP 135/52, requiring 3 to 5 L of oxygen  via nasal cannula.  CBC shows leukocytosis of 12.7, H&H 12.4/41.1, PLT 247.  NA: 134, K: 5.4.  BUN 30, creatinine 0.98, GFR 55.  Lipase: WNL.  Lactic acid: WNL.  COVID flu RSV negative.  UA negative for infection.  Troponin BNP elevated- flat. Chest x-ray>>no pneumonia. CT abdomen/pelvis>>no acute abnormality. 4.5 cm left adnexal simple appearing cyst unchanged other than slight increase in size from 4.  Centimeter.  Recommend pelvic ultrasound in 12 months. Large left inguinal hernia containing fat and small to moderate size right inguinal hernia containing fat.  Aortobiiliac stent within thrombosed infrarenal abdominal aortic aneurysm measuring 6.7 and maximum diameter previously 5.9.  EDP discussed with vascular surgery Dr. Ishmael who recommended outpatient follow-up with Dr. Thomas.  Patient was placed on antibiotics, CT chest was ordered and admitted. Ct chest - NAD  Subjective: Seen and examined. Resting comfortably on the bedside  chair. Overnight vital stable afebrile on room air. Labs personally reviewed potassium 5.6-despite being on Lokelma and low k diet.  Assessment and plan:  Acute hypoxemic respiratory failure Cough/congestin Question viral illness Elevated but flat troponin likely demand ischemia-: Unclear etiology,suspect multifactorial question viral illness patient has been having cough and cold for few weeks and recently treated with UTI, also has component of CHF. CXR/CT chest negative for any acute findings. COVID flu RSV negative.UA negative.    Managed w/ empiric antibiotics, blood culture  NGTD,Pro-Cal is slightly elevated. Echo shows EF 65 to 70%, RV size function normal AV is thickened calcified with restricted motion moderate to severe AS mean gradient has increased from 20-30 mm hg compared to 2024.  CHpEF Severe aortic stenosis Status post AAA repair and AAA pseudoaneurysm repair Soft blood pressure/orthostatic hypotension: Followed by cardiology and vascular surgery outpatient. CT abdomen shows increasing size of AAA from 5.9 to 6.7 cm.  EDP discussed with on-call vascular surgery Dr. Magda who recommended outpatient follow-up with Dr. Thomas. Previously she had wished for DNR, palliative consulted. GDMT on hold due to blood pressure, continue Toprol , Farxiga   Paroxysmal A-fib: Continue Eliquis  and tele   Left ventricular thrombus: Cont Eliquis    Continue hypothyroidism:  Continue levothyroxine    OSA: Not on CPAP. Has oxygen  at home however she does not use it regularly   Hyperkalemia Hyponatremia Mild metabolic acidosis: Potassium remains persistently elevated despite of being on Lokelma low potassium diet renal function is stable  Consulted nephrology.patient has been requesting for discharge. Recent Labs  Lab 01/11/24 1853 01/12/24 0040 01/13/24 0422 01/14/24 0727 01/14/24 1005 01/15/24 0359  K  --  5.2* 4.7 5.9* 5.7* 5.6*  CALCIUM  --  7.6* 7.0* 7.4* 7.7* 8.0*  MG  2.4  --   --   --   --   --   PHOS 2.6  --   --   --   --   --    CKD stage IIIa: Stable.Continue to monitor   COPD: Stable continue home inhalers    Anxiety: Continue BuSpar    Left adnexal cyst: Per CT report slightly increase in size from 4.1 cm to 4.5 cm.  Radiology recommended follow-up pelvic ultrasound in 12 months   Large left inguinal hernia: Noted on CT abdomen.  No sign of obstruction   Low back pain: Continue pain control patient reports taking Norco at home   [place] I Obesity w/ Body mass index is 34.76 kg/m.: Will benefit with PCP follow-up, weight loss,healthy lifestyle   Mobility: PT Orders: Active PT Follow up Rec: Home Health Pt10/06/2023 1341   DVT prophylaxis: apixaban  (ELIQUIS ) tablet 5 mg Start: 01/11/24 2330 SCDs Start: 01/11/24 1402 Code Status:   Code Status: Limited: Do not attempt resuscitation (DNR) -DNR-LIMITED -Do Not Intubate/DNI  Family Communication: plan of care discussed with patient.  Daughter not at the bedside today Patient status is: Remains hospitalized because of severity of illness Level of care: Telemetry> transferred to telemetry  Dispo: The patient is from: home            Anticipated disposition: TBD Objective: Vitals last 24 hrs: Vitals:   01/14/24 1423 01/14/24 2047 01/15/24 0327 01/15/24 1000  BP: (!) 118/46 138/70 128/72 (!) 136/51  Pulse: 78 64 62 60  Resp: 18 17 18    Temp: 98.8 F (37.1 C) 98.5 F (36.9 C) 98.1 F (36.7 C)   TempSrc: Oral Oral Oral   SpO2: 100% 95% 97%   Weight:      Height:        Physical Examination: General exam: AAOX3, NAD HEENT:Oral mucosa moist, Ear/Nose WNL grossly Respiratory system: CTA B/L,no use of accessory muscle Cardiovascular system: S1 & S2 +, No JVD. Gastrointestinal system: Abdomen soft,NT,ND, BS+ Nervous System: Alert, awake, moving all extremities,and following commands. Extremities: extremities warm, leg edema trace Skin: No rashes,no icterus. MSK: Normal muscle  bulk,tone, power   Medications reviewed:  Scheduled Meds:  apixaban   5 mg Oral BID   budesonide   0.5 mg Nebulization Daily   busPIRone   7.5 mg Oral QHS   cholecalciferol   5,000 Units Oral Daily   dapagliflozin  propanediol  10 mg Oral QAC breakfast   doxycycline   100 mg Oral Q12H   latanoprost   1 drop Both Eyes QHS   levothyroxine   125 mcg Oral QAC breakfast   melatonin  3 mg Oral QHS   metoprolol  succinate  25 mg Oral Daily   nortriptyline   10 mg Oral QHS   rOPINIRole   1 mg Oral TID   sodium zirconium cyclosilicate  10 g Oral Daily   Continuous Infusions:   Diet: Diet Order             Diet renal with fluid restriction Fluid restriction: 1500 mL Fluid; Room service appropriate? Yes; Fluid consistency: Thin  Diet effective now                    Data Reviewed: I have personally reviewed following labs and imaging studies ( see epic result tab) CBC: Recent Labs  Lab 01/11/24 0834 01/11/24 1853 01/12/24 0040 01/13/24 0422 01/14/24 0452 01/15/24 0359  WBC 12.7* 12.2* 9.8 7.8 9.1 8.0  NEUTROABS 11.3*  --   --   --   --   --  HGB 12.4 11.2* 12.0 10.4* 10.4* 10.8*  HCT 41.1 37.7 39.0 34.2* 36.8 35.6*  MCV 98.6 97.9 97.3 98.8 102.5* 97.3  PLT 247 200 200 168 196 224   CMP: Recent Labs  Lab 01/11/24 1853 01/12/24 0040 01/13/24 0422 01/14/24 0727 01/14/24 1005 01/15/24 0359  NA  --  133* 132* 133* 134* 133*  K  --  5.2* 4.7 5.9* 5.7* 5.6*  CL  --  101 103 102 101 103  CO2  --  20* 19* 23 16* 23  GLUCOSE  --  83 118* 92 103* 93  BUN  --  26* 34* 32* 32* 32*  CREATININE 1.19* 1.15* 1.16* 1.03* 1.17* 1.01*  CALCIUM  --  7.6* 7.0* 7.4* 7.7* 8.0*  MG 2.4  --   --   --   --   --   PHOS 2.6  --   --   --   --   --    GFR: Estimated Creatinine Clearance: 40.7 mL/min (A) (by C-G formula based on SCr of 1.01 mg/dL (H)). Recent Labs  Lab 01/11/24 0834  AST 17  ALT 7  ALKPHOS 101  BILITOT 0.5  PROT 7.3  ALBUMIN  3.9    Recent Labs  Lab 01/11/24 0834   LIPASE 15   No results for input(s): AMMONIA in the last 168 hours. Coagulation Profile: No results for input(s): INR, PROTIME in the last 168 hours. Unresulted Labs (From admission, onward)     Start     Ordered   01/13/24 0500  Basic metabolic panel with GFR  Daily,   R      01/12/24 0819   01/13/24 0500  CBC  Daily,   R      01/12/24 0819           Antimicrobials/Microbiology: Anti-infectives (From admission, onward)    Start     Dose/Rate Route Frequency Ordered Stop   01/12/24 1245  doxycycline  (VIBRA -TABS) tablet 100 mg        100 mg Oral Every 12 hours 01/12/24 1154     01/12/24 1215  cefTRIAXone (ROCEPHIN) 1 g in sodium chloride  0.9 % 100 mL IVPB        1 g 200 mL/hr over 30 Minutes Intravenous Daily 01/12/24 1154 01/14/24 1046   01/12/24 0000  cefTRIAXone (ROCEPHIN) 1 g in sodium chloride  0.9 % 100 mL IVPB  Status:  Discontinued        1 g 200 mL/hr over 30 Minutes Intravenous Every 24 hours 01/11/24 1640 01/11/24 1700   01/12/24 0000  azithromycin  (ZITHROMAX ) 500 mg in sodium chloride  0.9 % 250 mL IVPB  Status:  Discontinued        500 mg 250 mL/hr over 60 Minutes Intravenous Every 24 hours 01/11/24 1640 01/11/24 1700   01/11/24 0930  cefTRIAXone (ROCEPHIN) 1 g in sodium chloride  0.9 % 100 mL IVPB        1 g 200 mL/hr over 30 Minutes Intravenous  Once 01/11/24 0921 01/11/24 1045   01/11/24 0930  azithromycin  (ZITHROMAX ) 500 mg in sodium chloride  0.9 % 250 mL IVPB        500 mg 250 mL/hr over 60 Minutes Intravenous  Once 01/11/24 0921 01/11/24 1223         Component Value Date/Time   SDES  01/11/2024 1230    URINE, CLEAN CATCH Performed at [facility], [address], [place], [place] [address]    SPECREQUEST  01/11/2024 1230  NONE Performed at [facility], [address], [place], [place] [address]    CULT MULTIPLE SPECIES PRESENT, SUGGEST RECOLLECTION (A) 01/11/2024 1230   REPTSTATUS 01/12/2024 FINAL  01/11/2024 1230    Procedures: Mennie LAMY, MD [facility] 01/15/2024, 10:19 AM

## 2024-01-15 NOTE — Consult Note (Signed)
 [facility]  HISTORY AND PHYSICAL  Lisa Mcclure is an 89 y.o. female.    Chief Complaint: cough/ chills/ abd pain  HPI: Pt is an 34 F with a PMH sig for HFpEF, hypertension, paroxysmal A-fib on Eliquis , COPD, OSA not on CPAP, severe aortic stenosis, left atrial thrombus on Eliquis , hypothyroidism, anxiety, GERD, CKD 3a who is now seen at the request of Dr Christobal for eval and recs re: hyperkalemia.    Pt presented 01/13/24 for cough/ chills/ abd pain- empirically being treated with antibiotics.  Previously on spironolactone , entresto .  These were stopped, Lasix  was stopped, and Farxiga  and Eliquis  continued.  Metoprolol  added.    K has ranged from 5.2-5.9 the last several days.  Has been placed on lokelma and low K diet.  Last K 5.6 01/15/24.  In this setting we are asked to see.    No evidence of bleeding.  Pt feels thirsty.  No high K foods at bedside.  Not getting [place] heparin .    PMH: Past Medical History:  Diagnosis Date   Acute renal failure superimposed on stage 3b chronic kidney disease (HCC) 06/15/2021   Anemia    h/o hemorrhoidal bleeding and blood transfusion   Aortic stenosis    Chronic diastolic CHF (congestive heart failure) (HCC)    Depression    Diverticulosis    DOE (dyspnea on exertion)    Esophageal reflux    GERD (gastroesophageal reflux disease)    Hypothyroidism    Insomnia    LBP (low back pain)    OAB (overactive bladder)    Obesity    OSA (obstructive sleep apnea)    Osteoarthritis    Paroxysmal atrial fibrillation (HCC)    Rheumatoid arthritis(714.0)    Shoulder pain, bilateral    Unspecified essential hypertension    Venous insufficiency    PSH: Past Surgical History:  Procedure Laterality Date   ABDOMINAL AORTIC ENDOVASCULAR STENT GRAFT N/A 12/20/2019   Procedure: Aortogram including catheter selection of aorta and bilateral iliac arteriogram, Endovascular repair of infrarenal abdominal aortic aneurysm with bifurcated stent graft (26  mm x 14 x 12 main body, right bell bottom with a 20 mm x 10 cm piece, and left bell bottom with a 16 mm x 12 cm piece) ;  Surgeon: Gretta Lonni PARAS, MD;  Location: [facility] OR;  Service: Vascular;  Laterality: N/A;   APPENDECTOMY  1953   BIOPSY  06/18/2021   Procedure: BIOPSY;  Surgeon: Saintclair Jasper, MD;  Location: [facility] ENDOSCOPY;  Service: Gastroenterology;;   bladder abduction-1996  1996   breast biopsy Right 1980   CARDIOVERSION N/A 12/24/2019   Procedure: CARDIOVERSION;  Surgeon: Ladona Heinz, MD;  Location: [facility] ENDOSCOPY;  Service: Cardiovascular;  Laterality: N/A;   CARDIOVERSION N/A 04/25/2022   Procedure: CARDIOVERSION;  Surgeon: Ladona Heinz, MD;  Location: [facility] ENDOSCOPY;  Service: Cardiovascular;  Laterality: N/A;   CESAREAN SECTION     1957, 1961, 1964   COSMETIC SURGERY  1996   CYSTOCELE REPAIR     ESOPHAGOGASTRODUODENOSCOPY (EGD) WITH PROPOFOL  N/A 06/18/2021   Procedure: ESOPHAGOGASTRODUODENOSCOPY (EGD) WITH PROPOFOL ;  Surgeon: Saintclair Jasper, MD;  Location: [facility] ENDOSCOPY;  Service: Gastroenterology;  Laterality: N/A;   FLEXIBLE SIGMOIDOSCOPY N/A 04/10/2015   Procedure: FLEXIBLE SIGMOIDOSCOPY;  Surgeon: Elsie Cree, MD;  Location: [facility] ENDOSCOPY;  Service: Endoscopy;  Laterality: N/A;   INTRAMEDULLARY (IM) NAIL INTERTROCHANTERIC Left 02/03/2023   Procedure: LEFT INTRAMEDULLARY (IM) NAIL INTERTROCHANTERIC;  Surgeon: Vernetta Lonni GRADE, MD;  Location: [facility] OR;  Service:  Orthopedics;  Laterality: Left;   IR AORTAGRAM ABDOMINAL SERIALOGRAM  10/26/2022   IR EMBO ARTERIAL NOT HEMORR HEMANG INC GUIDE ROADMAPPING  10/24/2022   IR RADIOLOGIST EVAL & MGMT  09/13/2022   IR RADIOLOGIST EVAL & MGMT  11/24/2022   IR US  GUIDE VASC ACCESS LEFT  10/26/2022   IR US  GUIDE VASC ACCESS LEFT  10/26/2022   IR US  GUIDE VASC ACCESS LEFT  10/26/2022   IR US  GUIDE VASC ACCESS LEFT  10/26/2022   knee arthroscopy Right 1996, 2010   KNEE ARTHROSCOPY W/ AUTOGENOUS CARTILAGE IMPLANTATION (ACI) PROCEDURE Left 1994, 1995    REFRACTIVE SURGERY  01/2020   SHOULDER SURGERY  1990   TEE WITHOUT CARDIOVERSION N/A 12/24/2019   Procedure: TRANSESOPHAGEAL ECHOCARDIOGRAM (TEE);  Surgeon: Ladona Heinz, MD;  Location: [facility] ENDOSCOPY;  Service: Cardiovascular;  Laterality: N/A;   TEE WITHOUT CARDIOVERSION N/A 06/23/2022   Procedure: TRANSESOPHAGEAL ECHOCARDIOGRAM (TEE);  Surgeon: Dewane Shiner, DO;  Location: [facility] ENDOSCOPY;  Service: Cardiovascular;  Laterality: N/A;   TOTAL ABDOMINAL HYSTERECTOMY  1972   ULTRASOUND GUIDANCE FOR VASCULAR ACCESS Bilateral 12/20/2019   Procedure: Ultrasound-guided access of bilateral common femoral arteries for delivery of endograft and percutaneous closure;  Surgeon: Gretta Lonni PARAS, MD;  Location: [facility] OR;  Service: Vascular;  Laterality: Bilateral;   VESICOVAGINAL FISTULA CLOSURE W/ TAH     WRIST SURGERY  1967     Past Medical History:  Diagnosis Date   Acute renal failure superimposed on stage 3b chronic kidney disease (HCC) 06/15/2021   Anemia    h/o hemorrhoidal bleeding and blood transfusion   Aortic stenosis    Chronic diastolic CHF (congestive heart failure) (HCC)    Depression    Diverticulosis    DOE (dyspnea on exertion)    Esophageal reflux    GERD (gastroesophageal reflux disease)    Hypothyroidism    Insomnia    LBP (low back pain)    OAB (overactive bladder)    Obesity    OSA (obstructive sleep apnea)    Osteoarthritis    Paroxysmal atrial fibrillation (HCC)    Rheumatoid arthritis(714.0)    Shoulder pain, bilateral    Unspecified essential hypertension    Venous insufficiency     Medications:  Scheduled:  apixaban   5 mg Oral BID   budesonide   0.5 mg Nebulization Daily   busPIRone   7.5 mg Oral QHS   cholecalciferol   5,000 Units Oral Daily   dapagliflozin  propanediol  10 mg Oral QAC breakfast   doxycycline   100 mg Oral Q12H   latanoprost   1 drop Both Eyes QHS   levothyroxine   125 mcg Oral QAC breakfast   melatonin  3 mg Oral QHS   metoprolol  succinate   25 mg Oral Daily   nortriptyline   10 mg Oral QHS   rOPINIRole   1 mg Oral TID   sodium zirconium cyclosilicate  10 g Oral Daily    Medications Prior to Admission  Medication Sig Dispense Refill   ALEVE ARTHRITIS PAIN 1 % GEL Apply 2 g topically 4 (four) times daily as needed (for pain).     apixaban  (ELIQUIS ) 5 MG TABS tablet Take 1 tablet (5 mg total) by mouth 2 (two) times daily. 180 tablet 1   budesonide  (PULMICORT ) 0.5 MG/2ML nebulizer solution Take 2 mLs (0.5 mg total) by nebulization in the morning and at bedtime. (Patient taking differently: Take 0.5 mg by nebulization daily.) 120 mL 1   busPIRone  (BUSPAR ) 7.5 MG tablet Take 7.5 mg by mouth See  admin instructions. Take 7.5 mg by mouth at bedtime and an additional 7.5 mg once a day as needed for anxiety     Cholecalciferol  (VITAMIN D3) 125 MCG (5000 UT) CAPS Take 1 capsule by mouth daily.     dapagliflozin  propanediol (FARXIGA ) 10 MG TABS tablet Take 1 tablet (10 mg total) by mouth daily before breakfast. 90 tablet 3   furosemide  (LASIX ) 20 MG tablet Take 1 tablet (20 mg total) by mouth daily. 90 tablet 3   latanoprost  (XALATAN ) 0.005 % ophthalmic solution Place 1 drop into both eyes at bedtime.     levothyroxine  (SYNTHROID ) 125 MCG tablet Take 125 mcg by mouth daily before breakfast.      melatonin 3 MG TABS tablet Take 3 mg by mouth at bedtime.     methocarbamol  (ROBAXIN ) 500 MG tablet Take 500 mg by mouth every 6 (six) hours as needed for muscle spasms.     Multiple Vitamins-Minerals (PRESERVISION AREDS 2 PO) Take 1 capsule by mouth in the morning and at bedtime.     nortriptyline  (PAMELOR ) 25 MG capsule Take 25 mg by mouth at bedtime.     polyethylene glycol (MIRALAX  / GLYCOLAX ) 17 g packet Take 17 g by mouth 2 (two) times daily. Decrease to daily dosing if developing diarrhea/watery stools (Patient taking differently: Take 17 g by mouth daily as needed. Decrease to daily dosing if developing diarrhea/watery stools)     rOPINIRole   (REQUIP ) 1 MG tablet Take 1-2 mg by mouth See admin instructions. Take 2mg  by mouth at 1 PM and 1mg  at 9 PM (Patient taking differently: Take 1 mg by mouth 3 (three) times daily.)  12   sacubitril -valsartan  (ENTRESTO ) 24-26 MG Take 1 tablet by mouth 2 (two) times daily. 60 tablet 6   spironolactone  (ALDACTONE ) 25 MG tablet Take 25 mg by mouth daily.     SYSTANE COMPLETE PF 0.6 % SOLN Place 1 drop into both eyes 4 (four) times daily as needed (for dryness).     [EXPIRED] cephALEXin (KEFLEX) 500 MG capsule Take 500 mg by mouth 3 (three) times daily. (Patient not taking: Reported on 01/12/2024)     diclofenac  sodium (VOLTAREN ) 1 % GEL Apply 2.25 g topically 3 (three) times daily as needed (for pain). (Patient not taking: Reported on 01/12/2024)     [EXPIRED] nitrofurantoin (MACRODANTIN) 100 MG capsule Take 100 mg by mouth at bedtime. (Patient not taking: Reported on 01/12/2024)      ALLERGIES:   Allergies  Allergen Reactions   Gabapentin Other (See Comments)    Change in mental status    Methocarbamol  Other (See Comments)    Change mental status    Statins Other (See Comments)    Muscle aches and INTERNAL BLEEDING    FAM HX: Family History  Problem Relation Age of Onset   Lung cancer Mother    COPD Father    Breast cancer Maternal Aunt    Heart attack Maternal Grandmother 84   Lung cancer Son     Social History:   reports that she quit smoking about 39 years ago. Her smoking use included cigarettes. She started smoking about 49 years ago. She has a 2 pack-year smoking history. She has been exposed to tobacco smoke. She has never used smokeless tobacco. She reports current alcohol  use. She reports that she does not use drugs.  ROS: ROS: all other systems reviewed and are negative except per HPI  Blood pressure (!) 142/41, pulse 92, temperature 98.2 F (36.8 C), temperature  source Oral, resp. rate 18, height 5' 3 (1.6 m), weight 89 kg, SpO2 97%. PHYSICAL EXAM: Physical Exam GEN  sitting up in chair HEENT EOMI PERRL, anicteric NECK no overt JVD PULM clear CV irregular, III/VI SEM ABD obese, soft EXT no UE edema, nonpitting 1+ LE edema NEURO AAO x 3    Results for orders placed or performed during the hospital encounter of 01/11/24 (from the past 48 hours)  CBC     Status: Abnormal   Collection Time: 01/14/24  4:52 AM  Result Value Ref Range   WBC 9.1 4.0 - 10.5 K/uL   RBC 3.59 (L) 3.87 - 5.11 MIL/uL   Hemoglobin 10.4 (L) 12.0 - 15.0 g/dL   HCT 63.1 63.9 - 53.9 %   MCV 102.5 (H) 80.0 - 100.0 fL   MCH 29.0 26.0 - 34.0 pg   MCHC 28.3 (L) 30.0 - 36.0 g/dL   RDW 86.1 88.4 - 84.4 %   Platelets 196 150 - 400 K/uL   nRBC 0.0 0.0 - 0.2 %    Comment: Performed at [facility], [address], [place], [place] [address]  Basic metabolic panel with GFR     Status: Abnormal   Collection Time: 01/14/24  7:27 AM  Result Value Ref Range   Sodium 133 (L) 135 - 145 mmol/L   Potassium 5.9 (H) 3.5 - 5.1 mmol/L    Comment: Delta check noted    Chloride 102 98 - 111 mmol/L   CO2 23 22 - 32 mmol/L   Glucose, Bld 92 70 - 99 mg/dL    Comment: Glucose reference range applies only to samples taken after fasting for at least 8 hours.   BUN 32 (H) 8 - 23 mg/dL   Creatinine, Ser 8.96 (H) 0.44 - 1.00 mg/dL   Calcium 7.4 (L) 8.9 - 10.3 mg/dL   GFR, Estimated 52 (L) >60 mL/min    Comment: (NOTE) Calculated using the CKD-EPI Creatinine Equation (2021)    Anion gap 8 5 - 15    Comment: Performed at [facility], [address], [place], [place] [address]  Basic metabolic panel     Status: Abnormal   Collection Time: 01/14/24 10:05 AM  Result Value Ref Range   Sodium 134 (L) 135 - 145 mmol/L   Potassium 5.7 (H) 3.5 - 5.1 mmol/L   Chloride 101 98 - 111 mmol/L   CO2 16 (L) 22 - 32 mmol/L   Glucose, Bld 103 (H) 70 - 99 mg/dL    Comment: Glucose reference range applies only to samples taken after fasting for at least 8 hours.   BUN 32 (H) 8  - 23 mg/dL   Creatinine, Ser 8.82 (H) 0.44 - 1.00 mg/dL   Calcium 7.7 (L) 8.9 - 10.3 mg/dL   GFR, Estimated 45 (L) >60 mL/min    Comment: (NOTE) Calculated using the CKD-EPI Creatinine Equation (2021)    Anion gap 16 (H) 5 - 15    Comment: Performed at [facility], [address] [place], [place] [address]  Basic metabolic panel with GFR     Status: Abnormal   Collection Time: 01/15/24  3:59 AM  Result Value Ref Range   Sodium 133 (L) 135 - 145 mmol/L   Potassium 5.6 (H) 3.5 - 5.1 mmol/L   Chloride 103 98 - 111 mmol/L   CO2 23 22 - 32 mmol/L   Glucose, Bld 93 70 - 99 mg/dL    Comment: Glucose  reference range applies only to samples taken after fasting for at least 8 hours.   BUN 32 (H) 8 - 23 mg/dL   Creatinine, Ser 8.98 (H) 0.44 - 1.00 mg/dL   Calcium 8.0 (L) 8.9 - 10.3 mg/dL   GFR, Estimated 53 (L) >60 mL/min    Comment: (NOTE) Calculated using the CKD-EPI Creatinine Equation (2021)    Anion gap 8 5 - 15    Comment: Performed at [facility], [address], [place], [place] [address]  CBC     Status: Abnormal   Collection Time: 01/15/24  3:59 AM  Result Value Ref Range   WBC 8.0 4.0 - 10.5 K/uL   RBC 3.66 (L) 3.87 - 5.11 MIL/uL   Hemoglobin 10.8 (L) 12.0 - 15.0 g/dL   HCT 64.3 (L) 63.9 - 53.9 %   MCV 97.3 80.0 - 100.0 fL   MCH 29.5 26.0 - 34.0 pg   MCHC 30.3 30.0 - 36.0 g/dL   RDW 86.2 88.4 - 84.4 %   Platelets 224 150 - 400 K/uL   nRBC 0.0 0.0 - 0.2 %    Comment: Performed at [facility], [address] [place], [place] [address]    No results found.  Assessment/Plan  Hyperkalemia: mild, unclear etiology as of yet.  Off meds to cause hyperK. Will work-up for mild hemolysis that can be occurring in the setting of severe AS.    - agree with lokelma  - daily weights  - on fluid restriction and low K diet  - may need to add back Lasix   - continue to follow  2.  Sepsis:  - empiric antibiotics, per  primary  3.  HFpEF  - off entresto  and aldactone   4.  Afib:  - Eliquis  and metoprolol   5.  Dispo: pending  Lisa Mcclure 01/15/2024, 2:32 PM

## 2024-01-15 NOTE — Progress Notes (Signed)
 Physical Therapy Treatment Patient Details Name: Lisa Mcclure MRN: [id number] DOB: May 09, 1932 Today's Date: 01/15/2024   History of Present Illness Patient is an 89 year old female who presented to the ED with c/o cough and chills.  Dx with acute hypoxemic RF and suspected CHF exacerbation; CT negative for acute processm, TTE pending.  PMHx significant for AAA (stent, graft), A-fib, CHF (pEF), COPD (remote Hx of smoking, extensive secondhand smoke; 2 LPM PRN), OSA (no xPAP), appendectomy, hysterectomy, RA    PT Comments   The patient reports shoulder pain, patient  did ambulated 20' using RW,  Patient reports having assistance at home and plans tp return. Recommend HHPT. Continue PT.   If plan is discharge home, recommend the following: A little help with walking and/or transfers;A little help with bathing/dressing/bathroom;Assistance with cooking/housework;Assist for transportation;Help with stairs or ramp for entrance   Can travel by private vehicle        Equipment Recommendations  None recommended by PT    Recommendations for Other Services       Precautions / Restrictions Precautions Recall of Precautions/Restrictions: Intact Precaution/Restrictions Comments: no falls in past 6 months, fell a year ago and sustained L femur fx, both shoulders are bad,very incontinent, needs briefs/pad     Mobility  Bed Mobility   Bed Mobility: Sit to Supine     Supine to sit: Max assist     General bed mobility comments: assist legs onto bed    Transfers Overall transfer level: Needs assistance Equipment used: Rolling walker (2 wheels) Transfers: Sit to/from Stand Sit to Stand: Min assist           General transfer comment: assist to pwer up from recliner and bed    Ambulation/Gait Ambulation/Gait assistance: Min assist Gait Distance (Feet): 20 Feet Assistive device: Rolling walker (2 wheels) Gait Pattern/deviations: Step-to pattern, Antalgic       General  Gait Details: slow to pregress, antalgic   Stairs             Wheelchair Mobility     Tilt Bed    Modified Rankin (Stroke Patients Only)       Balance   Sitting-balance support: Feet supported Sitting balance-Leahy Scale: Fair     Standing balance support: Bilateral upper extremity supported, During functional activity, Reliant on assistive device for balance Standing balance-Leahy Scale: Fair                              Hotel manager: No apparent difficulties  Cognition Arousal: Alert Behavior During Therapy: WFL for tasks assessed/performed   PT - Cognitive impairments: No apparent impairments                                Cueing    Exercises      General Comments        Pertinent Vitals/Pain Pain Assessment Pain Assessment: Faces Pain Location: LBP, shoulders Pain Descriptors / Indicators: Aching, Sore Pain Intervention(s): Monitored during session    Home Living                          Prior Function            PT Goals (current goals can now be found in the care plan section) Progress towards PT goals: Progressing toward goals    Frequency  Min 3X/week      PT Plan      Co-evaluation              AM-PAC PT 6 Clicks Mobility   Outcome Measure  Help needed turning from your back to your side while in a flat bed without using bedrails?: A Little Help needed moving from lying on your back to sitting on the side of a flat bed without using bedrails?: A Little Help needed moving to and from a bed to a chair (including a wheelchair)?: A Little Help needed standing up from a chair using your arms (e.g., wheelchair or bedside chair)?: A Little Help needed to walk in hospital room?: A Little Help needed climbing 3-5 steps with a railing? : A Lot 6 Click Score: 17    End of Session Equipment Utilized During Treatment: Gait belt Activity Tolerance: Patient  tolerated treatment well Patient left: in bed;with call bell/phone within reach Nurse Communication: Mobility status PT Visit Diagnosis: Difficulty in walking, not elsewhere classified (R26.2)     Time: 8554-8484 PT Time Calculation (min) (ACUTE ONLY): 30 min  Charges:    $Gait Training: 8-22 mins $Self Care/Home Management: 8-22 PT General Charges $$ ACUTE PT VISIT: 1 Visit                     Darice Potters PT Acute Rehabilitation Services Office [phone]    Potters Darice Norris 01/15/2024, 3:22 PM

## 2024-01-16 ENCOUNTER — Other Ambulatory Visit (HOSPITAL_COMMUNITY): Payer: Self-pay

## 2024-01-16 ENCOUNTER — Telehealth: Payer: Self-pay | Admitting: Pharmacy Technician

## 2024-01-16 ENCOUNTER — Other Ambulatory Visit: Payer: Self-pay

## 2024-01-16 DIAGNOSIS — R051 Acute cough: Secondary | ICD-10-CM | POA: Diagnosis not present

## 2024-01-16 DIAGNOSIS — J9601 Acute respiratory failure with hypoxia: Secondary | ICD-10-CM | POA: Diagnosis not present

## 2024-01-16 DIAGNOSIS — A419 Sepsis, unspecified organism: Secondary | ICD-10-CM | POA: Diagnosis not present

## 2024-01-16 DIAGNOSIS — Z515 Encounter for palliative care: Secondary | ICD-10-CM | POA: Diagnosis not present

## 2024-01-16 LAB — CBC
HCT: 37.1 % (ref 36.0–46.0)
Hemoglobin: 10.9 g/dL — ABNORMAL LOW (ref 12.0–15.0)
MCH: 28.3 pg (ref 26.0–34.0)
MCHC: 29.4 g/dL — ABNORMAL LOW (ref 30.0–36.0)
MCV: 96.4 fL (ref 80.0–100.0)
Platelets: 260 K/uL (ref 150–400)
RBC: 3.85 MIL/uL — ABNORMAL LOW (ref 3.87–5.11)
RDW: 13.5 % (ref 11.5–15.5)
WBC: 8.6 K/uL (ref 4.0–10.5)
nRBC: 0 % (ref 0.0–0.2)

## 2024-01-16 LAB — CULTURE, BLOOD (ROUTINE X 2)
Culture: NO GROWTH
Culture: NO GROWTH

## 2024-01-16 LAB — BASIC METABOLIC PANEL WITH GFR
Anion gap: 11 (ref 5–15)
BUN: 35 mg/dL — ABNORMAL HIGH (ref 8–23)
CO2: 19 mmol/L — ABNORMAL LOW (ref 22–32)
Calcium: 8.5 mg/dL — ABNORMAL LOW (ref 8.9–10.3)
Chloride: 104 mmol/L (ref 98–111)
Creatinine, Ser: 0.99 mg/dL (ref 0.44–1.00)
GFR, Estimated: 54 mL/min — ABNORMAL LOW (ref >60)
Glucose, Bld: 87 mg/dL (ref 70–99)
Potassium: 5.2 mmol/L — ABNORMAL HIGH (ref 3.5–5.1)
Sodium: 134 mmol/L — ABNORMAL LOW (ref 135–145)

## 2024-01-16 LAB — HAPTOGLOBIN: Haptoglobin: 290 mg/dL (ref 41–333)

## 2024-01-16 MED ORDER — METOPROLOL SUCCINATE ER 25 MG PO TB24
25.0000 mg | ORAL_TABLET | Freq: Every day | ORAL | 0 refills | Status: DC
  Filled 2024-01-16: qty 30, 30d supply, fill #0

## 2024-01-16 MED ORDER — SODIUM ZIRCONIUM CYCLOSILICATE 10 G PO PACK
10.0000 g | PACK | Freq: Every day | ORAL | 0 refills | Status: DC
  Filled 2024-01-16: qty 3, 3d supply, fill #0

## 2024-01-16 MED ORDER — SODIUM ZIRCONIUM CYCLOSILICATE 10 G PO PACK: 10.0000 g | PACK | Freq: Every day | ORAL | 0 refills | Status: AC

## 2024-01-16 NOTE — Plan of Care (Signed)

## 2024-01-16 NOTE — Progress Notes (Signed)
 AVS reviewed with patient and daughter, no questions, IV removed.

## 2024-01-16 NOTE — Progress Notes (Signed)
 PROGRESS NOTE [name]  FMW:[id number] DOB: Jul 26, 1932 DOA: 01/11/2024 PCP: Yolande Toribio MATSU, MD  Brief Narrative/Hospital Course: Lisa Mcclure is a 89 y.o. 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 PTA around 1:30 AM she had severe chills and could not stop shaking therefore she called EMS. She was recently diagnosed with a UTI and treated with oral antibiotics. She had lower abdominal pain that started earlier this morning that resolved in the ER. She Lives alone,Independent on daily life activities w/ walker and sometimes wheelchair for ambulation. In ZI:jqzampoz, tachycardic, tachypneic, BP 135/52, requiring 3 to 5 L of oxygen  via nasal cannula.  CBC shows leukocytosis of 12.7, H&H 12.4/41.1, PLT 247.  NA: 134, K: 5.4.  BUN 30, creatinine 0.98, GFR 55.  Lipase: WNL.  Lactic acid: WNL.  COVID flu RSV negative.  UA negative for infection.  Troponin BNP elevated- flat. Chest x-ray>>no pneumonia. CT abdomen/pelvis>>no acute abnormality. 4.5 cm left adnexal simple appearing cyst unchanged other than slight increase in size from 4.  Centimeter.  Recommend pelvic ultrasound in 12 months. Large left inguinal hernia containing fat and small to moderate size right inguinal hernia containing fat.  Aortobiiliac stent within thrombosed infrarenal abdominal aortic aneurysm measuring 6.7 and maximum diameter previously 5.9.  EDP discussed with vascular surgery Dr. Ishmael who recommended outpatient follow-up with Dr. Thomas.  Patient was placed on antibiotics, CT chest was ordered and admitted. Ct chest - NAD  Subjective: Seen and examined. Overnight afebrile BP stable, labs  with potassium improved to 5.2 CBC stable Subcutaneous ecchymosis in the setting of Eliquis  and blood draw on hand  and stable, no skin breakdown Stares she has paper like skin  Assessment and plan:  Acute hypoxemic respiratory failure Cough/congestin Question viral illness Elevated but flat troponin likely demand ischemia-: Unclear etiology,suspect multifactorial question viral illness patient has been having cough and cold for few weeks and recently treated with UTI, also has component of CHF. CXR/CT chest negative for any acute findings. COVID flu RSV negative.UA negative.   Managed w/ empiric antibiotics, blood culture  NGTD,Pro-Cal is slightly elevated. Complete antibiotics today. Echo shows EF 65 to 70%, RV size function normal AV is thickened calcified with restricted motion moderate to severe AS mean gradient has increased from 20-30 mm hg compared to 2024.   Hyperkalemia Hyponatremia Mild metabolic acidosis: Potassium remains persistently elevated despite of being on Lokelma low potassium diet, renal function is stable  Lasix  has been held, nephrology consulted potassium improving may need to resume Lasix  back on -await nephrology plan. Recent Labs  Lab 01/11/24 1853 01/12/24 0040 01/13/24 0422 01/14/24 0727 01/14/24 1005 01/15/24 0359 01/16/24 0430  K  --    < > 4.7 5.9* 5.7* 5.6* 5.2*  CALCIUM  --    < > 7.0* 7.4* 7.7* 8.0* 8.5*  MG 2.4  --   --   --   --   --   --   PHOS 2.6  --   --   --   --   --   --    < > = values in this interval not displayed.   CKD stage IIIa: Stable.Continue to monitor.  Nephrology following  CHpEF Severe  aortic stenosis Status post AAA repair and AAA pseudoaneurysm repair Soft blood pressure/orthostatic hypotension: Followed by cardiology and vascular surgery outpatient. CT abdomen shows increasing size of AAA from 5.9 to 6.7 cm.  EDP discussed with on-call vascular surgery Dr. Magda who recommended outpatient follow-up with Dr.  Thomas. Previously she had wished for DNR, palliative consulted. GDMT on hold due to blood pressure, continue Toprol , Farxiga   Paroxysmal A-fib Left ventricular thrombus history: Continue Eliquis  and tele   Continue hypothyroidism:  Continue levothyroxine    OSA: Not on CPAP. Has oxygen  at home however she does not use it regularly  COPD: Stable continue home inhalers    Anxiety: Continue BuSpar    Left adnexal cyst: Per CT report slightly increase in size from 4.1 cm to 4.5 cm.  Radiology recommended follow-up pelvic ultrasound in 12 months   Large left inguinal hernia: Noted on CT abdomen.  No sign of obstruction   Low back pain: Continue pain control patient reports taking Norco at home   [place] Obesity w/ Body mass index is 34.99 kg/m.: Will benefit with PCP follow-up, weight loss,healthy lifestyle   Mobility: PT Orders: Active PT Follow up Rec: Home Health Pt10/09/2023 1520   DVT prophylaxis: apixaban  (ELIQUIS ) tablet 5 mg Start: 01/11/24 2330 SCDs Start: 01/11/24 1402 Code Status:   Code Status: Limited: Do not attempt resuscitation (DNR) -DNR-LIMITED -Do Not Intubate/DNI  Family Communication: plan of care discussed with patient.  Daughter not at the bedside today Patient status is: Remains hospitalized because of severity of illness Level of care: Telemetry  Dispo: The patient is from: home            Anticipated disposition: TBD Objective: Vitals last 24 hrs: Vitals:   01/15/24 2034 01/16/24 0426 01/16/24 0440 01/16/24 0756  BP: (!) 179/72  (!) 162/78   Pulse: 72  64   Resp: 19  19   Temp: 98 F (36.7 C)  (!) 97.5 F (36.4 C)   TempSrc: Oral  Oral   SpO2: (!) 80%  96% 97%  Weight:  89.6 kg    Height:        Physical Examination: General exam: AAOX3, NAD HEENT:Oral mucosa moist, Ear/Nose WNL grossly Respiratory system: CTA B/L,no use of accessory muscle Cardiovascular system: S1 & S2 +, No JVD. Gastrointestinal system: Abdomen soft nt Nervous  System: Alert, awake, moving all extremities,and following commands. Extremities: extremities warm, leg edema trace, left hand w/ ecchymoses/skin bleeding-stable Skin: No rashes,no icterus. MSK: Normal muscle bulk,tone, power   Medications reviewed:  Scheduled Meds:  apixaban   5 mg Oral BID   budesonide   0.5 mg Nebulization Daily   busPIRone   7.5 mg Oral QHS   cholecalciferol   5,000 Units Oral Daily   dapagliflozin  propanediol  10 mg Oral QAC breakfast   doxycycline   100 mg Oral Q12H   latanoprost   1 drop Both Eyes QHS   levothyroxine   125 mcg Oral QAC breakfast   melatonin  3 mg Oral QHS   metoprolol  succinate  25 mg Oral Daily   nortriptyline   10 mg Oral QHS   rOPINIRole   1 mg Oral TID   sodium zirconium cyclosilicate  10 g Oral Daily   Continuous Infusions:   Diet: Diet Order             Diet renal with fluid restriction Fluid restriction: 2000 mL Fluid; Room service appropriate? Yes; Fluid consistency: Thin  Diet effective now  Data Reviewed: I have personally reviewed following labs and imaging studies ( see epic result tab) CBC: Recent Labs  Lab 01/11/24 0834 01/11/24 1853 01/12/24 0040 01/13/24 0422 01/14/24 0452 01/15/24 0359 01/16/24 0430  WBC 12.7*   < > 9.8 7.8 9.1 8.0 8.6  NEUTROABS 11.3*  --   --   --   --   --   --   HGB 12.4   < > 12.0 10.4* 10.4* 10.8* 10.9*  HCT 41.1   < > 39.0 34.2* 36.8 35.6* 37.1  MCV 98.6   < > 97.3 98.8 102.5* 97.3 96.4  PLT 247   < > 200 168 196 224 260   < > = values in this interval not displayed.   CMP: Recent Labs  Lab 01/11/24 1853 01/12/24 0040 01/13/24 0422 01/14/24 0727 01/14/24 1005 01/15/24 0359 01/16/24 0430  NA  --    < > 132* 133* 134* 133* 134*  K  --    < > 4.7 5.9* 5.7* 5.6* 5.2*  CL  --    < > 103 102 101 103 104  CO2  --    < > 19* 23 16* 23 19*  GLUCOSE  --    < > 118* 92 103* 93 87  BUN  --    < > 34* 32* 32* 32* 35*  CREATININE 1.19*   < > 1.16* 1.03* 1.17* 1.01* 0.99   CALCIUM  --    < > 7.0* 7.4* 7.7* 8.0* 8.5*  MG 2.4  --   --   --   --   --   --   PHOS 2.6  --   --   --   --   --   --    < > = values in this interval not displayed.   GFR: Estimated Creatinine Clearance: 41.7 mL/min (by C-G formula based on SCr of 0.99 mg/dL). Recent Labs  Lab 01/11/24 0834 01/15/24 1452  AST 17  --   ALT 7  --   ALKPHOS 101  --   BILITOT 0.5 0.3  PROT 7.3  --   ALBUMIN  3.9  --     Recent Labs  Lab 01/11/24 0834  LIPASE 15   No results for input(s): AMMONIA in the last 168 hours. Coagulation Profile: No results for input(s): INR, PROTIME in the last 168 hours. Unresulted Labs (From admission, onward)     Start     Ordered   01/13/24 0500  Basic metabolic panel with GFR  Daily,   R      01/12/24 0819   01/13/24 0500  CBC  Daily,   R      01/12/24 0819           Antimicrobials/Microbiology: Anti-infectives (From admission, onward)    Start     Dose/Rate Route Frequency Ordered Stop   01/12/24 1245  doxycycline  (VIBRA -TABS) tablet 100 mg        100 mg Oral Every 12 hours 01/12/24 1154     01/12/24 1215  cefTRIAXone (ROCEPHIN) 1 g in sodium chloride  0.9 % 100 mL IVPB        1 g 200 mL/hr over 30 Minutes Intravenous Daily 01/12/24 1154 01/14/24 1046   01/12/24 0000  cefTRIAXone (ROCEPHIN) 1 g in sodium chloride  0.9 % 100 mL IVPB  Status:  Discontinued        1 g 200 mL/hr over 30 Minutes Intravenous Every 24 hours 01/11/24 1640 01/11/24 1700  01/12/24 0000  azithromycin  (ZITHROMAX ) 500 mg in sodium chloride  0.9 % 250 mL IVPB  Status:  Discontinued        500 mg 250 mL/hr over 60 Minutes Intravenous Every 24 hours 01/11/24 1640 01/11/24 1700   01/11/24 0930  cefTRIAXone (ROCEPHIN) 1 g in sodium chloride  0.9 % 100 mL IVPB        1 g 200 mL/hr over 30 Minutes Intravenous  Once 01/11/24 0921 01/11/24 1045   01/11/24 0930  azithromycin  (ZITHROMAX ) 500 mg in sodium chloride  0.9 % 250 mL IVPB        500 mg 250 mL/hr over 60 Minutes Intravenous   Once 01/11/24 9078 01/11/24 1223         Component Value Date/Time   SDES  01/11/2024 1230    URINE, CLEAN CATCH Performed at [facility], [address], [place], [place] [address]    SPECREQUEST  01/11/2024 1230    NONE Performed at [facility], [address], [place], [place] [address]    CULT MULTIPLE SPECIES PRESENT, SUGGEST RECOLLECTION (A) 01/11/2024 1230   REPTSTATUS 01/12/2024 FINAL 01/11/2024 1230    Procedures: Mennie LAMY, MD [facility] 01/16/2024, 12:52 PM

## 2024-01-16 NOTE — Progress Notes (Signed)
 [facility] Progress Note   Assessment/ Plan:    Hyperkalemia: mild, unclear etiology as of yet.  Off meds to cause hyperK. Will work-up for mild hemolysis that can be occurring in the setting of severe AS.               - No evidence of sig hemolysis  - continue lokelma for 3 days after discharge  - 2L fluid/ daily  - would add back PO Lasix  day after d/c  - OK for d/c from renal perspective   2.  Sepsis:             - empiric antibiotics, per primary   3.  HFpEF             - off entresto  and aldactone    4.  Afib:             - Eliquis  and metoprolol    5.  Dispo: ok for d/c today  Subjective:    Seen in room.  K 5.2, Cr better.  Looks like slight hypovolemia with Type IV RTA physiology.  Pt's dtr at bedside.  Apparently she was also using a lot of salt substitute at home too   Objective:   BP (!) 160/80 (BP Location: Left Arm)   Pulse 65   Temp 98.8 F (37.1 C) (Oral)   Resp 17   Ht 5' 3 (1.6 m)   Wt 89.6 kg   SpO2 97%   BMI 34.99 kg/m   Physical Exam: GEN sitting up in chair HEENT EOMI PERRL, anicteric NECK no overt JVD PULM clear CV irregular, III/VI SEM ABD obese, soft EXT no UE edema, nonpitting 1+ LE edema NEURO AAO x 3  Labs: BMET Recent Labs  Lab 01/11/24 0834 01/11/24 1853 01/12/24 0040 01/13/24 0422 01/14/24 0727 01/14/24 1005 01/15/24 0359 01/16/24 0430  NA 134*  --  133* 132* 133* 134* 133* 134*  K 5.4*  --  5.2* 4.7 5.9* 5.7* 5.6* 5.2*  CL 103  --  101 103 102 101 103 104  CO2 21*  --  20* 19* 23 16* 23 19*  GLUCOSE 99  --  83 118* 92 103* 93 87  BUN 30*  --  26* 34* 32* 32* 32* 35*  CREATININE 0.98 1.19* 1.15* 1.16* 1.03* 1.17* 1.01* 0.99  CALCIUM 8.0*  --  7.6* 7.0* 7.4* 7.7* 8.0* 8.5*  PHOS  --  2.6  --   --   --   --   --   --    CBC Recent Labs  Lab 01/11/24 0834 01/11/24 1853 01/13/24 0422 01/14/24 0452 01/15/24 0359 01/16/24 0430  WBC 12.7*   < > 7.8 9.1 8.0 8.6  NEUTROABS 11.3*  --   --   --   --    --   HGB 12.4   < > 10.4* 10.4* 10.8* 10.9*  HCT 41.1   < > 34.2* 36.8 35.6* 37.1  MCV 98.6   < > 98.8 102.5* 97.3 96.4  PLT 247   < > 168 196 224 260   < > = values in this interval not displayed.      Medications:     apixaban   5 mg Oral BID   budesonide   0.5 mg Nebulization Daily   busPIRone   7.5 mg Oral QHS   cholecalciferol   5,000 Units Oral Daily   dapagliflozin  propanediol  10 mg Oral QAC breakfast   doxycycline   100 mg Oral Q12H  latanoprost   1 drop Both Eyes QHS   levothyroxine   125 mcg Oral QAC breakfast   melatonin  3 mg Oral QHS   metoprolol  succinate  25 mg Oral Daily   nortriptyline   10 mg Oral QHS   rOPINIRole   1 mg Oral TID   sodium zirconium cyclosilicate  10 g Oral Daily     Almarie Bonine MD 01/16/2024, 3:03 PM

## 2024-01-16 NOTE — Progress Notes (Signed)
 Daily Progress Note   Patient Name: Lisa Mcclure       Date: 01/16/2024 DOB: 14-Jan-1933  Age: 89 y.o. MRN#: [id number] Attending Physician: Christobal Guadalajara, MD Primary Care Physician: Yolande Toribio MATSU, MD Admit Date: 01/11/2024  Reason for Consultation/Follow-up: Establishing goals of care  Patient Profile/HPI:  Lisa Mcclure is a 89 y.o. female with multiple medical problems including HFpEF, hypertension, A-fib on Eliquis , COPD, OSA not on CPAP, and severe AS.  Patient was admitted the hospital in 01/11/2024 with respiratory failure, thought to be possibly due to viral etiology.  However, she was started on antibiotics.  Palliative care was consulted to address goals.   Subjective: Chart reviewed including labs, progress notes, imaging from this and previous encounters.  Respiratory status is improving. Labs improving- stable for d/c from nephrology standpoint.  Ambulated with PT.  On eval pt sitting up awake, alert. Daughter at bedside. Reports her desire for rigorous outpatient PT, but feels limited by her multiple joint issues.  She expresses difficulty getting to and from appointments and would like information re: transportation resources.  She and daughter would also like referral for outpatient Palliative to visit her at home.   Review of Systems  Musculoskeletal:  Positive for joint pain.     Physical Exam Vitals and nursing note reviewed.  Constitutional:      Appearance: She is not ill-appearing.  Cardiovascular:     Rate and Rhythm: Normal rate.  Pulmonary:     Effort: Pulmonary effort is normal.  Neurological:     Mental Status: She is alert and oriented to person, place, and time.             Vital Signs: BP (!) 160/80 (BP Location: Left Arm)   Pulse 65    Temp 98.8 F (37.1 C) (Oral)   Resp 17   Ht 5' 3 (1.6 m)   Wt 89.6 kg   SpO2 97%   BMI 34.99 kg/m  SpO2: SpO2: 97 % O2 Device: O2 Device: Room Air O2 Flow Rate: O2 Flow Rate (L/min): 5 L/min  Intake/output summary:  Intake/Output Summary (Last 24 hours) at 01/16/2024 1528 Last data filed at 01/16/2024 1100 Gross per 24 hour  Intake 560 ml  Output 700 ml  Net -140 ml   LBM: Last BM Date : 01/14/24  Baseline Weight: Weight: 89 kg Most recent weight: Weight: 89.6 kg       Palliative Assessment/Data: PPS: 50%      Patient Active Problem List   Diagnosis Date Noted   Sepsis with acute organ dysfunction without septic shock (HCC) 01/13/2024   Palliative care encounter 01/13/2024   Osteoporosis, postmenopausal 12/19/2023   Closed comminuted intertrochanteric fracture of proximal end of left femur (HCC) 02/02/2023   S/P AAA repair 02/02/2023   Left atrial thrombus 02/02/2023   Closed left hip fracture (HCC) 02/02/2023   DNR (do not resuscitate) 01/25/2023   CHF exacerbation (HCC) 11/29/2022   Acute exacerbation of CHF (congestive heart failure) (HCC) 06/19/2022   Chronic diastolic CHF (congestive heart failure) (HCC) 04/25/2022   Chronic renal failure, stage 3b (HCC) 04/25/2022   Class 2 obesity 04/25/2022   Persistent atrial fibrillation (HCC) 04/22/2022   Chronic cough 03/28/2022   Acute hypoxemic respiratory failure (HCC) 06/15/2021   Paroxysmal atrial fibrillation (HCC) 06/15/2021   Acute renal failure with acute tubular necrosis superimposed on stage 3b chronic kidney disease (HCC) 06/15/2021   Generalized weakness 06/15/2021   Atrial fibrillation with RVR (HCC)    Long term (current) use of anticoagulants    Long term current use of antiarrhythmic drug    AAA (abdominal aortic aneurysm) without rupture 12/20/2019   AAA (abdominal aortic aneurysm) 12/20/2019   Intolerance of continuous positive airway pressure (CPAP) ventilation 11/26/2019   Severe hypoxemia  08/21/2019   Severe obstructive sleep apnea 07/29/2019   COPD (chronic obstructive pulmonary disease) (HCC) 07/29/2019   RLS (restless legs syndrome) 07/29/2019   Dehydration with hyponatremia 09/13/2018   Bradycardia 09/13/2018   Glaucoma 09/13/2018   OA (osteoarthritis) of shoulder 05/16/2018   Incomplete emptying of bladder 05/17/2017   Labial adhesions 05/17/2017   Mixed stress and urge urinary incontinence 05/17/2017   Urethral sphincter deficiency, intrinsic (ISD) 05/17/2017   Severe aortic stenosis 01/15/2016   Bilateral carotid bruits 05/11/2015   Hyponatremia 04/19/2015   Symptomatic anemia 04/19/2015   Panic attack 04/19/2015   Constipation 04/19/2015   Hyperkalemia 04/17/2015   Influenza A 04/16/2015   Proximal humerus fracture    Left patella fracture    Benign essential HTN    Esophageal reflux    Chronic heart failure with preserved ejection fraction (HFpEF) (HCC)    Hypothyroidism    Lower GI bleed 04/08/2015   Hematochezia    Rectal bleeding 04/03/2015   Patellar fracture 03/06/2015   Left humeral fracture 11/04/2013   Rib fracture 11/04/2013   Hypothyroid 11/04/2013   Essential hypertension 11/04/2013   Right shoulder pain 11/04/2013   OSA (obstructive sleep apnea) 11/13/2008   BOOP (bronchiolitis obliterans with organizing pneumonia) (HCC) 08/25/2008    Palliative Care Assessment & Plan    Assessment/Recommendations/Plan  Viral respiratory failure- improved- patient nearing readiness for discharge TOC order placed for referral to outpatient Pallative and for assistance with transportation to medical appointments   Code Status:   Code Status: Limited: Do not attempt resuscitation (DNR) -DNR-LIMITED -Do Not Intubate/DNI    Prognosis:  Unable to determine  Discharge Planning: Home with Home Health  Care plan was discussed with patient and family  Thank you for allowing the Palliative Medicine Team to assist in the care of this  patient.  Total time:  35 minutes Prolonged billing:  Time includes:   Preparing to see the patient (e.g., review of tests) Obtaining and/or reviewing separately obtained history Performing a medically necessary appropriate examination and/or evaluation Counseling  and educating the patient/family/caregiver Ordering medications, tests, or procedures Referring and communicating with other health care professionals (when not reported separately) Documenting clinical information in the electronic or other health record Independently interpreting results (not reported separately) and communicating results to the patient/family/caregiver Care coordination (not reported separately) Clinical documentation  Cassondra Stain, AGNP-C Palliative Medicine   Please contact Palliative Medicine Team phone at [phone] for questions and concerns.

## 2024-01-16 NOTE — Progress Notes (Signed)
 Per hospitalist, if performing lab draws, please use adequate compressing/pressure wrap after draw as patient is prone to extended bleeding with current anticoagulant.

## 2024-01-16 NOTE — Progress Notes (Signed)
 Discharge medication delivered to patient at bedside in a secure bag.

## 2024-01-16 NOTE — Telephone Encounter (Signed)
 Received notification from [employer] that patient is conditionally approved- notifications mailed to patient and in media

## 2024-01-16 NOTE — Discharge Summary (Signed)
 Physician Discharge Summary  [name] FMW:[id number] DOB: 03/03/1933 DOA: 01/11/2024  PCP: Yolande Toribio MATSU, MD  Admit date: 01/11/2024 Discharge date: 01/16/2024 Recommendations for Outpatient Follow-up:  Follow up with PCP in 1 weeks-call for appointment Monitor Left hand ecchymotic skin bleeding Please obtain BMP/CBC in one week  Discharge Dispo: Home with home health Discharge Condition: Stable Code Status:   Code Status: Limited: Do not attempt resuscitation (DNR) -DNR-LIMITED -Do Not Intubate/DNI  Diet recommendation:  Diet Order             Diet - low sodium heart healthy           Diet renal with fluid restriction Fluid restriction: 2000 mL Fluid; Room service appropriate? Yes; Fluid consistency: Thin  Diet effective now                    Brief/Interim Summary: Lisa Mcclure is a 89 y.o. 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 PTA around 1:30 AM she had severe chills and could not stop shaking therefore she called EMS. She was recently diagnosed with a UTI and treated with oral antibiotics. She had lower abdominal pain that started earlier this morning that resolved in the ER. She Lives alone,Independent on daily life activities w/ walker and sometimes wheelchair for ambulation. In ZI:jqzampoz, tachycardic, tachypneic, BP 135/52, requiring 3 to 5 L of oxygen  via nasal cannula.  CBC shows leukocytosis of 12.7, H&H 12.4/41.1, PLT 247.  NA: 134, K: 5.4.  BUN 30, creatinine 0.98, GFR 55.  Lipase: WNL.  Lactic acid: WNL.  COVID flu RSV negative.  UA negative for infection.  Troponin BNP elevated- flat. Chest x-ray>>no pneumonia. CT  abdomen/pelvis>>no acute abnormality. 4.5 cm left adnexal simple appearing cyst unchanged other than slight increase in size from 4.  Centimeter.  Recommend pelvic ultrasound in 12 months. Large left inguinal hernia containing fat and small to moderate size right inguinal hernia containing fat.  Aortobiiliac stent within thrombosed infrarenal abdominal aortic aneurysm measuring 6.7 and maximum diameter previously 5.9.EDP discussed with vascular surgery Dr. Ishmael who recommended outpatient follow-up with Dr. Thomas.  Patient was placed on antibiotics, CT chest was ordered and admitted.Ct chest-NAD. Likely viral etiology manage improved with antibiotics.  Seen by cardiology underwent repeat echocardiogram stable with moderate to severe AS GDMT Entresto  Aldactone  discontinued. Hospitalized uncomfortable hyperkalemia seen by nephrology potassium improving continue [facility] and outpatient follow-up Patient Antibiotics.  She is doing well on room air able to ambulate well and she will be discharged home with home health and daughter will be staying the night with her  Subjective: Seen and examined. She declined to stay any more,And would feel better at home Subcutaneous ecchymosis in the setting of Eliquis  and blood draw on hand  and stable, no skin breakdown Stares she has paper like skin  Discharge diagnosis:  Acute hypoxemic respiratory failure Cough/congestin Question viral illness Elevated but flat troponin likely demand ischemia-: Unclear etiology,suspect multifactorial question viral illness patient has been having cough and cold for few weeks and recently treated with UTI, also has component of CHF. CXR/CT chest negative for any acute findings. COVID flu RSV negative.UA negative.   Managed w/ empiric antibiotics, blood culture  NGTD,Pro-Cal is slightly elevated. Complete antibiotics today. Echo shows EF 65 to 70%, RV size function normal AV is thickened calcified with restricted motion moderate  to severe AS mean gradient has increased from 20-30 mm hg compared to 2024.   Hyperkalemia Hyponatremia Mild metabolic acidosis: Potassium remains persistently elevated despite of being on Lokelma low potassium diet, renal function is stable  Lasix  has been held, nephrology consulted potassium improving -nephrology advised to resume low-dose Lasix  and low potassium diet Lokelma x 3 days patient has been extensively educated to avoid high potassium diet and avoid salt supplement which has potassium  Follow-up with PCP/nephrology to monitor labs Recent Labs  Lab 01/11/24 1853 01/12/24 0040 01/13/24 0422 01/14/24 0727 01/14/24 1005 01/15/24 0359 01/16/24 0430  K  --    < > 4.7 5.9* 5.7* 5.6* 5.2*  CALCIUM  --    < > 7.0* 7.4* 7.7* 8.0* 8.5*  MG 2.4  --   --   --   --   --   --   PHOS 2.6  --   --   --   --   --   --    < > = values in this interval not displayed.   CKD stage IIIa: Stable.Continue to monitor.  Nephrology following  CHpEF Severe aortic stenosis Status post AAA repair and AAA pseudoaneurysm repair Soft blood pressure/orthostatic hypotension: Followed by cardiology and vascular surgery outpatient. CT abdomen shows increasing size of AAA from 5.9 to 6.7 cm.  EDP discussed with on-call vascular surgery Dr. Magda who recommended outpatient follow-up with Dr. Thomas. Previously she had wished for DNR, palliative consulted. GDMT on hold due to blood pressure, continue Toprol , Farxiga   Paroxysmal A-fib Left ventricular thrombus history: Continue Eliquis  and tele   Continue hypothyroidism:  Continue levothyroxine    OSA: Not on CPAP. Has oxygen  at home however she does not use it regularly  COPD: Stable continue home inhalers    Anxiety: Continue BuSpar .   Left adnexal cyst: Per CT report slightly increase in size from 4.1 cm to 4.5 cm.  Radiology recommended follow-up pelvic ultrasound in 12 months   Large left inguinal hernia: Noted on CT abdomen.  No sign  of obstruction   Low back pain: Continue pain control patient reports taking Norco at home   Left hand ecchymotic bleeding: In the setting of Eliquis  use within the skin and blood draws.  Slightly getting better advised to follow-up with PCP later this week  Leverette I Obesity w/ Body mass index is 34.99 kg/m.: Will benefit with PCP follow-up, weight loss,healthy lifestyle   Mobility: PT Orders: Active PT Follow up Rec: Home Health Pt10/09/2023 1520   DVT prophylaxis: apixaban  (ELIQUIS ) tablet 5 mg Start: 01/11/24 2330 SCDs Start: 01/11/24 1402 Code Status:   Code Status: Limited: Do not attempt resuscitation (DNR) -DNR-LIMITED -Do Not Intubate/DNI  Family Communication: plan of care discussed with patient.  Daughter not at the bedside today Patient status is: Remains hospitalized because of severity of illness Level of care: Telemetry  Dispo: The patient is from: home            Anticipated disposition: home w/ hh Objective: Vitals last 24 hrs: Vitals:   01/16/24 0426 01/16/24 0440 01/16/24 0756 01/16/24 1431  BP:  (!) 162/78  (!) 160/80  Pulse:  64  65  Resp:  19  17  Temp:  (!) 97.5 F (36.4 C)  98.8 F (37.1 C)  TempSrc:  Oral  Oral  SpO2:  96% 97% 97%  Weight: 89.6 kg     Height:        Physical Examination: General exam: AAOX3, NAD HEENT:Oral mucosa moist, Ear/Nose WNL grossly Respiratory system: CTA B/L,no use of accessory muscle Cardiovascular system: S1 & S2 +, No JVD. Gastrointestinal system: Abdomen soft nt Nervous System: Alert, awake, moving all extremities,and following commands. Extremities: extremities warm, leg edema trace, left hand w/ ecchymoses/skin bleeding-stable Skin: No rashes,no icterus. MSK: Normal muscle bulk,tone, power       Consultation: Cardiology Nephrology  Discharge Instructions  Discharge Instructions     (HEART FAILURE PATIENTS) Call MD:  Anytime you have any of the following symptoms: 1) 3 pound weight gain in 24 hours or 5  pounds in 1 week 2) shortness of breath, with or without a dry hacking cough 3) swelling in the hands, feet or stomach 4) if you have to sleep on extra pillows at night in order to breathe.   Complete by: As directed    Diet - low sodium heart healthy   Complete by: As directed    Low potassium diet   Discharge instructions   Complete by: As directed    Follow-up with primary care doctor regarding left hand bleeding monitor renal function continue low potassium diet  Please call call MD or return to ER for similar or worsening recurring problem that brought you to hospital or if any fever,nausea/vomiting,abdominal pain, uncontrolled pain, chest pain,  shortness of breath or any other alarming symptoms.  Please follow-up your doctor as instructed in a week time and call the office for appointment.  Please avoid alcohol , smoking, or any other illicit substance and maintain healthy habits including taking your regular medications as prescribed.  You were cared for by a hospitalist during your hospital stay. If you have any questions about your discharge medications or the care you received while you were in the hospital after you are discharged, you can call the unit and ask to speak with the hospitalist on call if the hospitalist that took care of you is not available.  Once you are discharged, your primary care physician will handle any further medical issues. Please note that NO REFILLS for any discharge medications will be authorized once you are discharged, as it is imperative that you return to your primary care physician (or establish a relationship with a primary care physician if you do not have one) for your aftercare needs so that they can reassess your need for medications and monitor your lab values   Face-to-face encounter (required for Medicare/Medicaid patients)   Complete by: As directed    I Mennie LAMY certify that this patient is under my care and that I, or a nurse practitioner or  physician's assistant working with me, had a face-to-face encounter that meets the physician face-to-face encounter requirements with this patient on 01/16/2024. The encounter with the patient was in whole, or in part for the following medical condition(s) which is the primary reason for home health care (List medical condition): Deconditioning debility   The encounter with the patient was in whole, or in part, for the following medical condition, which is the primary reason for home health care: Deconditioning debility   I certify that, based on my findings, the following services are medically necessary home health services: Physical therapy   Reason for Medically Necessary Home Health Services: Therapy- Therapeutic Exercises to Increase Strength and Endurance   My clinical findings support the need for the above services: Shortness of  breath with activity   Further, I certify that my clinical findings support that this patient is homebound due to: Unable to leave home safely without assistance   Home Health   Complete by: As directed    To provide the following care/treatments:  PT OT     Increase activity slowly   Complete by: As directed    No wound care   Complete by: As directed       Allergies as of 01/16/2024       Reactions   Gabapentin Other (See Comments)   Change in mental status    Methocarbamol  Other (See Comments)   Change mental status    Statins Other (See Comments)   Muscle aches and INTERNAL BLEEDING        Medication List     STOP taking these medications    cephALEXin 500 MG capsule Commonly known as: KEFLEX   Entresto  24-26 MG Generic drug: sacubitril -valsartan    nitrofurantoin 100 MG capsule Commonly known as: MACRODANTIN   spironolactone  25 MG tablet Commonly known as: ALDACTONE        TAKE these medications    apixaban  5 MG Tabs tablet Commonly known as: ELIQUIS  Take 1 tablet (5 mg total) by mouth 2 (two) times daily.   budesonide  0.5  MG/2ML nebulizer solution Commonly known as: Pulmicort  Take 2 mLs (0.5 mg total) by nebulization in the morning and at bedtime. What changed: when to take this   busPIRone  7.5 MG tablet Commonly known as: BUSPAR  Take 7.5 mg by mouth See admin instructions. Take 7.5 mg by mouth at bedtime and an additional 7.5 mg once a day as needed for anxiety   dapagliflozin  propanediol 10 MG Tabs tablet Commonly known as: Farxiga  Take 1 tablet (10 mg total) by mouth daily before breakfast.   Aleve Arthritis Pain 1 % Gel Generic drug: diclofenac  Sodium Apply 2 g topically 4 (four) times daily as needed (for pain).   diclofenac  sodium 1 % Gel Commonly known as: VOLTAREN  Apply 2.25 g topically 3 (three) times daily as needed (for pain).   furosemide  20 MG tablet Commonly known as: LASIX  Take 1 tablet (20 mg total) by mouth daily.   latanoprost  0.005 % ophthalmic solution Commonly known as: XALATAN  Place 1 drop into both eyes at bedtime.   levothyroxine  125 MCG tablet Commonly known as: SYNTHROID  Take 125 mcg by mouth daily before breakfast.   melatonin 3 MG Tabs tablet Take 3 mg by mouth at bedtime.   methocarbamol  500 MG tablet Commonly known as: ROBAXIN  Take 500 mg by mouth every 6 (six) hours as needed for muscle spasms.   metoprolol  succinate 25 MG 24 hr tablet Commonly known as: TOPROL -XL Take 1 tablet (25 mg total) by mouth daily. Start taking on: January 17, 2024   nortriptyline  25 MG capsule Commonly known as: PAMELOR  Take 25 mg by mouth at bedtime.   polyethylene glycol 17 g packet Commonly known as: MIRALAX  / GLYCOLAX  Take 17 g by mouth 2 (two) times daily. Decrease to daily dosing if developing diarrhea/watery stools What changed:  when to take this reasons to take this   PRESERVISION AREDS 2 PO Take 1 capsule by mouth in the morning and at bedtime.   rOPINIRole  1 MG tablet Commonly known as: REQUIP  Take 1-2 mg by mouth See admin instructions. Take 2mg  by mouth  at 1 PM and 1mg  at 9 PM What changed:  how much to take when to take this additional instructions   sodium  zirconium cyclosilicate 10 g Pack packet Commonly known as: LOKELMA Take 10 g by mouth daily for 3 days. Start taking on: January 17, 2024   Systane Complete PF 0.6 % Soln Generic drug: Propylene Glycol (PF) Place 1 drop into both eyes 4 (four) times daily as needed (for dryness).   Vitamin D3 125 MCG (5000 UT) Caps Take 1 capsule by mouth daily.        Follow-up Information     [facility] Follow up.   Specialty: Home Health Services Why: Hedda will provide PT and OT in the home after discharge. Contact information: [address] [place] [address] [phone]         Emelia Josefa HERO, NP Follow up on 01/31/2024.   Specialty: Cardiology Why: Cardiology Hospital Follow-up on 01/31/2024 at 2:20 PM. Contact information: [address] [place] [address] [phone]         Yolande Toribio MATSU, MD Follow up in 1 week(s).   Specialty: Internal Medicine Contact information: [address] [place] [address] [phone]                Allergies  Allergen Reactions   Gabapentin Other (See Comments)    Change in mental status    Methocarbamol  Other (See Comments)    Change mental status    Statins Other (See Comments)    Muscle aches and INTERNAL BLEEDING    The results of significant diagnostics from this hospitalization (including imaging, microbiology, ancillary and laboratory) are listed below for reference.    Microbiology: Recent Results (from the past 240 hours)  Blood culture (routine x 2)     Status: None   Collection Time: 01/11/24  8:33 AM   Specimen: BLOOD  Result Value Ref Range Status   Specimen Description   Final    BLOOD BLOOD LEFT FOREARM Performed at [facility], [address], [place], [place] [address]    Special Requests   Final    BOTTLES DRAWN  AEROBIC AND ANAEROBIC Blood Culture results may not be optimal due to an inadequate volume of blood received in culture bottles Performed at [facility], [address], [place], [place] [address]    Culture   Final    NO GROWTH 5 DAYS Performed at [facility], [address], [place], [place] [address]    Report Status 01/16/2024 FINAL  Final  Blood culture (routine x 2)     Status: None   Collection Time: 01/11/24  8:50 AM   Specimen: BLOOD  Result Value Ref Range Status   Specimen Description   Final    BLOOD LEFT ANTECUBITAL Performed at [facility], [address], [place], [place] [address]    Special Requests   Final    BOTTLES DRAWN AEROBIC ONLY Blood Culture results may not be optimal due to an inadequate volume of blood received in culture bottles Performed at [facility], [address] [place], [place] [address]    Culture   Final    NO GROWTH 5 DAYS Performed at [facility], [address] [place], [place] [address]    Report Status 01/16/2024 FINAL  Final  Resp panel by RT-PCR (RSV, Flu A&B, Covid) Anterior Nasal Swab     Status: None   Collection Time: 01/11/24  9:24 AM   Specimen: Anterior Nasal Swab  Result Value Ref Range Status   SARS Coronavirus 2 by  RT PCR NEGATIVE NEGATIVE Final    Comment: (NOTE) SARS-CoV-2 target nucleic acids are NOT DETECTED.  The SARS-CoV-2 RNA is generally detectable in upper respiratory specimens during the acute phase of infection. The lowest concentration of SARS-CoV-2 viral copies this assay can detect is 138 copies/mL. A negative result does not preclude SARS-Cov-2 infection and should not be used as the sole basis for treatment or other patient management decisions. A negative result may occur with  improper specimen collection/handling, submission of specimen other than nasopharyngeal swab, presence of viral mutation(s) within the areas targeted by  this assay, and inadequate number of viral copies(<138 copies/mL). A negative result must be combined with clinical observations, patient history, and epidemiological information. The expected result is Negative.  Fact Sheet for Patients:  BloggerCourse.com  Fact Sheet for Healthcare Providers:  SeriousBroker.it  This test is no t yet approved or cleared by the [place]  FDA and  has been authorized for detection and/or diagnosis of SARS-CoV-2 by FDA under an Emergency Use Authorization (EUA). This EUA will remain  in effect (meaning this test can be used) for the duration of the COVID-19 declaration under Section 564(b)(1) of the Act, 21 U.S.C.section 360bbb-3(b)(1), unless the authorization is terminated  or revoked sooner.       Influenza A by PCR NEGATIVE NEGATIVE Final   Influenza B by PCR NEGATIVE NEGATIVE Final    Comment: (NOTE) The Xpert Xpress SARS-CoV-2/FLU/RSV plus assay is intended as an aid in the diagnosis of influenza from Nasopharyngeal swab specimens and should not be used as a sole basis for treatment. Nasal washings and aspirates are unacceptable for Xpert Xpress SARS-CoV-2/FLU/RSV testing.  Fact Sheet for Patients: BloggerCourse.com  Fact Sheet for Healthcare Providers: SeriousBroker.it  This test is not yet approved or cleared by the [place]  FDA and has been authorized for detection and/or diagnosis of SARS-CoV-2 by FDA under an Emergency Use Authorization (EUA). This EUA will remain in effect (meaning this test can be used) for the duration of the COVID-19 declaration under Section 564(b)(1) of the Act, 21 U.S.C. section 360bbb-3(b)(1), unless the authorization is terminated or revoked.     Resp Syncytial Virus by PCR NEGATIVE NEGATIVE Final    Comment: (NOTE) Fact Sheet for Patients: BloggerCourse.com  Fact Sheet  for Healthcare Providers: SeriousBroker.it  This test is not yet approved or cleared by the [place]  FDA and has been authorized for detection and/or diagnosis of SARS-CoV-2 by FDA under an Emergency Use Authorization (EUA). This EUA will remain in effect (meaning this test can be used) for the duration of the COVID-19 declaration under Section 564(b)(1) of the Act, 21 U.S.C. section 360bbb-3(b)(1), unless the authorization is terminated or revoked.  Performed at [facility], [address] [place], [place] [address]   Urine Culture     Status: Abnormal   Collection Time: 01/11/24 12:30 PM   Specimen: Urine, Clean Catch  Result Value Ref Range Status   Specimen Description   Final    URINE, CLEAN CATCH Performed at [facility], [address] [place], [place] [address]    Special Requests   Final    NONE Performed at [facility], [address] [place], [place] [address]    Culture MULTIPLE SPECIES PRESENT, SUGGEST RECOLLECTION (A)  Final   Report Status 01/12/2024 FINAL  Final  Respiratory (~20 pathogens) panel by PCR     Status: None   Collection Time: 01/11/24  6:52 PM   Specimen:  Nasopharyngeal Swab; Respiratory  Result Value Ref Range Status   Adenovirus NOT DETECTED NOT DETECTED Final   Coronavirus 229E NOT DETECTED NOT DETECTED Final    Comment: (NOTE) The Coronavirus on the Respiratory Panel, DOES NOT test for the novel  Coronavirus (2019 nCoV)    Coronavirus HKU1 NOT DETECTED NOT DETECTED Final   Coronavirus NL63 NOT DETECTED NOT DETECTED Final   Coronavirus OC43 NOT DETECTED NOT DETECTED Final   Metapneumovirus NOT DETECTED NOT DETECTED Final   Rhinovirus / Enterovirus NOT DETECTED NOT DETECTED Final   Influenza A NOT DETECTED NOT DETECTED Final   Influenza B NOT DETECTED NOT DETECTED Final   Parainfluenza Virus 1 NOT DETECTED NOT DETECTED Final   Parainfluenza Virus 2  NOT DETECTED NOT DETECTED Final   Parainfluenza Virus 3 NOT DETECTED NOT DETECTED Final   Parainfluenza Virus 4 NOT DETECTED NOT DETECTED Final   Respiratory Syncytial Virus NOT DETECTED NOT DETECTED Final   Bordetella pertussis NOT DETECTED NOT DETECTED Final   Bordetella Parapertussis NOT DETECTED NOT DETECTED Final   Chlamydophila pneumoniae NOT DETECTED NOT DETECTED Final   Mycoplasma pneumoniae NOT DETECTED NOT DETECTED Final    Comment: Performed at [facility], [address], [place], [place] [address]    Procedures/Studies: ECHOCARDIOGRAM COMPLETE Result Date: 01/12/2024    ECHOCARDIOGRAM REPORT   Patient Name:   Lisa Mcclure Date of Exam: 01/12/2024 Medical Rec #:  [id number]         Height:       63.0 in Accession #:    [id number]        Weight:       196.2 lb Date of Birth:  [birth year]        BSA:          1.918 m Patient Age:    89 years          BP:           127/49 mmHg Patient Gender: F                 HR:           55 bpm. Exam Location:  Inpatient Procedure: 2D Echo, Cardiac Doppler, Color Doppler and 3D Echo (Both Spectral            and Color Flow Doppler were utilized during procedure). Indications:    CHF I50.31  History:        Patient has prior history of Echocardiogram examinations, most                 recent 06/19/2021. Cardiomegaly and CHF, Arrythmias:Atrial                 Fibrillation; Risk Factors:Hypertension and Sleep Apnea. H/O                 Rheumatoid arthritis, Chronic kidney disease, Sepsis, Moderate                 to severe Ao stenosis. AAA repair. Left atrial thrombus.  Sonographer:    BERNARDA ROCKS Referring Phys: [id number] VELNA SAUNDERS PAHWANI IMPRESSIONS  1. Left ventricular ejection fraction, by estimation, is 65 to 70%. The left ventricle has normal function. The left ventricle has no regional wall motion abnormalities. Left ventricular diastolic parameters are indeterminate.  2. Right ventricular systolic function is normal. The right ventricular size is  normal.  3. Left atrial size was severely dilated.  4. Mild mitral valve regurgitation. Moderate to  severe mitral annular calcification.  5. AV is thickened, calcified with restricted motion. Peak and mean gradients through the valve are 60 and 30 mm hg respectively AVA (VTI) is 0.9 cm2 Dimensionless index is 0.31 OVerall consistent with moderate to severe AS COmpared to echo from 2024 mean gradient has increased (20 to 30 mm Hg). Aortic valve regurgitation is mild.  6. The inferior vena cava is dilated in size with <50% respiratory variability, suggesting right atrial pressure of 15 mmHg. FINDINGS  Left Ventricle: Left ventricular ejection fraction, by estimation, is 65 to 70%. The left ventricle has normal function. The left ventricle has no regional wall motion abnormalities. The left ventricular internal cavity size was normal in size. There is  no left ventricular hypertrophy. Left ventricular diastolic parameters are indeterminate. Right Ventricle: The right ventricular size is normal. Right vetricular wall thickness was not assessed. Right ventricular systolic function is normal. Left Atrium: Left atrial size was severely dilated. Right Atrium: Right atrial size was normal in size. Pericardium: There is no evidence of pericardial effusion. Mitral Valve: There is moderate thickening of the mitral valve leaflet(s). Moderate to severe mitral annular calcification. Mild mitral valve regurgitation. MV peak gradient, 9.2 mmHg. The mean mitral valve gradient is 4.0 mmHg. Tricuspid Valve: The tricuspid valve is normal in structure. Tricuspid valve regurgitation is trivial. Aortic Valve: AV is thickened, calcified with restricted motion. Peak and mean gradients through the valve are 60 and 30 mm hg respectively AVA (VTI) is 0.9 cm2 Dimensionless index is 0.31 OVerall consistent with moderate to severe AS COmpared to echo from 2024 mean gradient has increased (20 to 30 mm Hg). Aortic valve regurgitation is mild. Aortic  regurgitation PHT measures 377 msec. Aortic valve mean gradient measures 30.0 mmHg. Aortic valve peak gradient measures 60.8 mmHg. Aortic valve area, by VTI measures 0.88 cm. Pulmonic Valve: The pulmonic valve was not well visualized. Pulmonic valve regurgitation is not visualized. No evidence of pulmonic stenosis. Aorta: The aortic root and ascending aorta are structurally normal, with no evidence of dilitation. Venous: The inferior vena cava is dilated in size with less than 50% respiratory variability, suggesting right atrial pressure of 15 mmHg. IAS/Shunts: No atrial level shunt detected by color flow Doppler.  LEFT VENTRICLE PLAX 2D LVIDd:         5.50 cm      Diastology LVIDs:         3.50 cm      LV e' medial:    5.55 cm/s LV PW:         1.00 cm      LV E/e' medial:  24.0 LV IVS:        0.90 cm      LV e' lateral:   6.31 cm/s LVOT diam:     1.90 cm      LV E/e' lateral: 21.1 LV SV:         72 LV SV Index:   38 LVOT Area:     2.84 cm  LV Volumes (MOD) LV vol d, MOD A2C: 130.0 ml LV vol d, MOD A4C: 116.0 ml LV vol s, MOD A2C: 40.9 ml LV vol s, MOD A4C: 50.7 ml LV SV MOD A2C:     89.1 ml LV SV MOD A4C:     116.0 ml LV SV MOD BP:      79.1 ml RIGHT VENTRICLE             IVC RV Basal diam:  3.50 cm  IVC diam: 2.20 cm RV S prime:     16.60 cm/s TAPSE (M-mode): 2.1 cm LEFT ATRIUM              Index        RIGHT ATRIUM           Index LA diam:        6.10 cm  3.18 cm/m   RA Area:     13.80 cm LA Vol (A2C):   79.0 ml  41.19 ml/m  RA Volume:   26.90 ml  14.03 ml/m LA Vol (A4C):   150.0 ml 78.21 ml/m LA Biplane Vol: 115.0 ml 59.96 ml/m  AORTIC VALVE                     PULMONIC VALVE AV Area (Vmax):    0.75 cm      PV Vmax:          1.03 m/s AV Area (Vmean):   0.69 cm      PV Peak grad:     4.2 mmHg AV Area (VTI):     0.88 cm      PR End Diast Vel: 1.40 msec AV Vmax:           390.00 cm/s AV Vmean:          252.000 cm/s AV VTI:            0.822 m AV Peak Grad:      60.8 mmHg AV Mean Grad:      30.0 mmHg  LVOT Vmax:         103.00 cm/s LVOT Vmean:        61.500 cm/s LVOT VTI:          0.254 m LVOT/AV VTI ratio: 0.31 AI PHT:            377 msec  AORTA Ao Root diam: 3.50 cm Ao Asc diam:  3.50 cm MITRAL VALVE MV Area (PHT): 3.30 cm     SHUNTS MV Area VTI:   1.76 cm     Systemic VTI:  0.25 m MV Peak grad:  9.2 mmHg     Systemic Diam: 1.90 cm MV Mean grad:  4.0 mmHg MV Vmax:       1.52 m/s MV Vmean:      84.9 cm/s MV Decel Time: 230 msec MR Peak grad: 48.7 mmHg MR Vmax:      349.00 cm/s MV E velocity: 133.00 cm/s MV A velocity: 113.00 cm/s MV E/A ratio:  1.18 Vina Gull MD Electronically signed by Vina Gull MD Signature Date/Time: 01/12/2024/4:28:51 PM    Final    CT CHEST WO CONTRAST Result Date: 01/11/2024 EXAM: CT CHEST WITHOUT CONTRAST 01/11/2024 04:15:58 PM TECHNIQUE: CT of the chest was performed without the administration of intravenous contrast. Multiplanar reformatted images are provided for review. Automated exposure control, iterative reconstruction, and/or weight based adjustment of the mA/kV was utilized to reduce the radiation dose to as low as reasonably achievable. COMPARISON: Comparison exam 11/23/2023. CLINICAL HISTORY: Respiratory illness, nondiagnostic xray. Got up to let dog out then sat in living room and could not stop shaking, like rigors and chills. Has had it happen before but never extended that long. FINDINGS: MEDIASTINUM: Coronary artery calcification and aortic atherosclerotic calcification. Heart and pericardium are unremarkable. The central airways are clear. LYMPH NODES: No mediastinal, hilar or axillary lymphadenopathy. LUNGS AND PLEURA: Mild bibasilar atelectasis. Mild pleural thickening. No airspace disease. No pneumonia. No suspicious  pulmonary nodules. No pleural effusion or pneumothorax. SOFT TISSUES/BONES: No acute abnormality of the bones or soft tissues. UPPER ABDOMEN: Limited images of the upper abdomen demonstrates no acute abnormality. IMPRESSION: 1. No acute intrathoracic  findings. Electronically signed by: Norleen Boxer MD 01/11/2024 04:48 PM EDT RP Workstation: [id number]   CT ABDOMEN PELVIS W CONTRAST Result Date: 01/11/2024 CLINICAL DATA:  Left lower quadrant abdominal pain, chills and fever since this morning. Recently treated for a urinary tract infection. Pelvic pain. Shortness of breath. EXAM: CT ABDOMEN AND PELVIS WITH CONTRAST TECHNIQUE: Multidetector CT imaging of the abdomen and pelvis was performed using the standard protocol following bolus administration of intravenous contrast. RADIATION DOSE REDUCTION: This exam was performed according to the departmental dose-optimization program which includes automated exposure control, adjustment of the mA and/or kV according to patient size and/or use of iterative reconstruction technique. CONTRAST:  OMNIPAQUE  IOHEXOL  300 MG/ML  SOLN COMPARISON:  07/02/2022 FINDINGS: Lower chest: Atheromatous calcifications, including the coronary arteries and aorta. Dense mitral valve annulus calcifications. Mildly enlarged heart. No pericardial effusion. Mild-to-moderate dependent atelectasis at both lung bases. Hepatobiliary: No focal liver abnormality is seen. No gallstones, gallbladder wall thickening, or biliary dilatation. Pancreas: Moderate diffuse pancreatic atrophy. Spleen: Normal in size without focal abnormality. Adrenals/Urinary Tract: Adrenal glands are unremarkable. Kidneys are normal, without renal calculi, focal lesion, or hydronephrosis. Bladder is unremarkable. There is some air in the urinary bladder, compatible with recent catheterization. Stomach/Bowel: Scattered colonic diverticula, most numerous in the right colon and sigmoid colon, without evidence of diverticulitis. Surgically absent appendix. Unremarkable stomach and small bowel. Vascular/Lymphatic: Extensive atheromatous arterial calcifications. Aorto bi-iliac stent within a thrombosed infrarenal abdominal aortic aneurysm measuring 6.7 cm in maximum diameter.  There are multiple interval embolization coils in the region of the previously demonstrated endoleak. There is a small amount of developing calcification within the thrombus on the right on image number 43/11, measuring 209 Hounsfield units in density and a small amount of developing curvilinear calcification within the thrombus posteriorly on the left on image number 46/11. No enlarged lymph nodes. Reproductive: A 4.5 cm left adnexal simple appearing cyst measuring 7 Hounsfield units in density on image number 68/11 is unchanged other than a slight increase in size from 4.1 cm in maximum diameter previously. Surgically absent uterus and ovaries. Other: Large left inguinal hernia containing fat and small to moderate-sized right inguinal hernia containing fat. Musculoskeletal: Left hip fixation hardware. Lower thoracic spine degenerative changes. IMPRESSION: 1. No acute abnormality. 2. Colonic diverticulosis without evidence of diverticulitis. 3. 4.5 cm left adnexal simple appearing cyst, unchanged other than a slight increase in size from 4.1 cm in maximum diameter previously. Recommend follow-up pelvic ultrasound in 12 months. 4. Large left inguinal hernia containing fat and small to moderate-sized right inguinal hernia containing fat. 5. Aorto bi-iliac stent within a thrombosed infrarenal abdominal aortic aneurysm measuring 6.7 cm in maximum diameter, previously 5.9 cm. 6. Interval embolization coils in the region of the previously demonstrated endoleak with no residual leak seen. 7. Calcific coronary artery and aortic atherosclerosis. Aortic Atherosclerosis (ICD10-I70.0). Electronically Signed   By: Elspeth Bathe M.D.   On: 01/11/2024 11:52   DG Chest Portable 1 View Result Date: 01/11/2024 CLINICAL DATA:  Cough, shortness of breath, fever and chills EXAM: PORTABLE CHEST 1 VIEW COMPARISON:  11/22/2023 FINDINGS: Stable cardiac enlargement and aortic tortuosity. Prominent central pulmonary arteries. Stable  chronic lung disease with bilateral pulmonary interstitial prominence and scattered bilateral pulmonary scarring. No overt pulmonary edema, pleural fluid  or airspace consolidation. No pneumothorax. The visualized skeletal structures are unremarkable. IMPRESSION: Stable chronic lung disease with bilateral pulmonary interstitial prominence and scattered bilateral pulmonary scarring. No acute findings. Electronically Signed   By: Marcey Moan M.D.   On: 01/11/2024 09:21    Labs: BNP (last 3 results) Recent Labs    11/22/23 1829  BNP 155.7*   Basic Metabolic Panel: Recent Labs  Lab 01/11/24 1853 01/12/24 0040 01/13/24 0422 01/14/24 0727 01/14/24 1005 01/15/24 0359 01/16/24 0430  NA  --    < > 132* 133* 134* 133* 134*  K  --    < > 4.7 5.9* 5.7* 5.6* 5.2*  CL  --    < > 103 102 101 103 104  CO2  --    < > 19* 23 16* 23 19*  GLUCOSE  --    < > 118* 92 103* 93 87  BUN  --    < > 34* 32* 32* 32* 35*  CREATININE 1.19*   < > 1.16* 1.03* 1.17* 1.01* 0.99  CALCIUM  --    < > 7.0* 7.4* 7.7* 8.0* 8.5*  MG 2.4  --   --   --   --   --   --   PHOS 2.6  --   --   --   --   --   --    < > = values in this interval not displayed.   Liver Function Tests: Recent Labs  Lab 01/11/24 0834 01/15/24 1452  AST 17  --   ALT 7  --   ALKPHOS 101  --   BILITOT 0.5 0.3  PROT 7.3  --   ALBUMIN  3.9  --    Recent Labs  Lab 01/11/24 0834  LIPASE 15   No results for input(s): AMMONIA in the last 168 hours. CBC: Recent Labs  Lab 01/11/24 0834 01/11/24 1853 01/12/24 0040 01/13/24 0422 01/14/24 0452 01/15/24 0359 01/16/24 0430  WBC 12.7*   < > 9.8 7.8 9.1 8.0 8.6  NEUTROABS 11.3*  --   --   --   --   --   --   HGB 12.4   < > 12.0 10.4* 10.4* 10.8* 10.9*  HCT 41.1   < > 39.0 34.2* 36.8 35.6* 37.1  MCV 98.6   < > 97.3 98.8 102.5* 97.3 96.4  PLT 247   < > 200 168 196 224 260   < > = values in this interval not displayed.   CBG: No results for input(s): GLUCAP in the last 168  hours. Hgb A1c No results for input(s): HGBA1C in the last 72 hours. Anemia work up No results for input(s): VITAMINB12, FOLATE, FERRITIN, TIBC, IRON , RETICCTPCT in the last 72 hours.  Urinalysis    Component Value Date/Time   COLORURINE YELLOW 01/11/2024 1230   APPEARANCEUR CLEAR 01/11/2024 1230   LABSPEC 1.037 (H) 01/11/2024 1230   PHURINE 7.0 01/11/2024 1230   GLUCOSEU >=500 (A) 01/11/2024 1230   HGBUR NEGATIVE 01/11/2024 1230   BILIRUBINUR NEGATIVE 01/11/2024 1230   KETONESUR NEGATIVE 01/11/2024 1230   PROTEINUR NEGATIVE 01/11/2024 1230   UROBILINOGEN 0.2 01/06/2009 0808   NITRITE NEGATIVE 01/11/2024 1230   LEUKOCYTESUR NEGATIVE 01/11/2024 1230   Sepsis Labs Recent Labs  Lab 01/13/24 0422 01/14/24 0452 01/15/24 0359 01/16/24 0430  WBC 7.8 9.1 8.0 8.6   Microbiology Recent Results (from the past 240 hours)  Blood culture (routine x 2)     Status: None  Collection Time: 01/11/24  8:33 AM   Specimen: BLOOD  Result Value Ref Range Status   Specimen Description   Final    BLOOD BLOOD LEFT FOREARM Performed at [facility], [address], [place], [place] [address]    Special Requests   Final    BOTTLES DRAWN AEROBIC AND ANAEROBIC Blood Culture results may not be optimal due to an inadequate volume of blood received in culture bottles Performed at [facility], [address] [place], [place] [address]    Culture   Final    NO GROWTH 5 DAYS Performed at [facility], [address], [place], [place] [address]    Report Status 01/16/2024 FINAL  Final  Blood culture (routine x 2)     Status: None   Collection Time: 01/11/24  8:50 AM   Specimen: BLOOD  Result Value Ref Range Status   Specimen Description   Final    BLOOD LEFT ANTECUBITAL Performed at [facility], [address] [place], [place] [address]    Special Requests   Final    BOTTLES DRAWN AEROBIC ONLY Blood Culture results  may not be optimal due to an inadequate volume of blood received in culture bottles Performed at [facility], [address] [place], [place] [address]    Culture   Final    NO GROWTH 5 DAYS Performed at [facility], [address], [place], [place] [address]    Report Status 01/16/2024 FINAL  Final  Resp panel by RT-PCR (RSV, Flu A&B, Covid) Anterior Nasal Swab     Status: None   Collection Time: 01/11/24  9:24 AM   Specimen: Anterior Nasal Swab  Result Value Ref Range Status   SARS Coronavirus 2 by RT PCR NEGATIVE NEGATIVE Final    Comment: (NOTE) SARS-CoV-2 target nucleic acids are NOT DETECTED.  The SARS-CoV-2 RNA is generally detectable in upper respiratory specimens during the acute phase of infection. The lowest concentration of SARS-CoV-2 viral copies this assay can detect is 138 copies/mL. A negative result does not preclude SARS-Cov-2 infection and should not be used as the sole basis for treatment or other patient management decisions. A negative result may occur with  improper specimen collection/handling, submission of specimen other than nasopharyngeal swab, presence of viral mutation(s) within the areas targeted by this assay, and inadequate number of viral copies(<138 copies/mL). A negative result must be combined with clinical observations, patient history, and epidemiological information. The expected result is Negative.  Fact Sheet for Patients:  BloggerCourse.com  Fact Sheet for Healthcare Providers:  SeriousBroker.it  This test is no t yet approved or cleared by the [place]  FDA and  has been authorized for detection and/or diagnosis of SARS-CoV-2 by FDA under an Emergency Use Authorization (EUA). This EUA will remain  in effect (meaning this test can be used) for the duration of the COVID-19 declaration under Section 564(b)(1) of the Act, 21 U.S.C.section 360bbb-3(b)(1),  unless the authorization is terminated  or revoked sooner.       Influenza A by PCR NEGATIVE NEGATIVE Final   Influenza B by PCR NEGATIVE NEGATIVE Final    Comment: (NOTE) The Xpert Xpress SARS-CoV-2/FLU/RSV plus assay is intended as an aid in the diagnosis of influenza from Nasopharyngeal swab specimens and should not be used as a sole basis for treatment. Nasal washings and aspirates are unacceptable for Xpert Xpress SARS-CoV-2/FLU/RSV testing.  Fact Sheet for Patients: BloggerCourse.com  Fact  Sheet for Healthcare Providers: SeriousBroker.it  This test is not yet approved or cleared by the [place]  FDA and has been authorized for detection and/or diagnosis of SARS-CoV-2 by FDA under an Emergency Use Authorization (EUA). This EUA will remain in effect (meaning this test can be used) for the duration of the COVID-19 declaration under Section 564(b)(1) of the Act, 21 U.S.C. section 360bbb-3(b)(1), unless the authorization is terminated or revoked.     Resp Syncytial Virus by PCR NEGATIVE NEGATIVE Final    Comment: (NOTE) Fact Sheet for Patients: BloggerCourse.com  Fact Sheet for Healthcare Providers: SeriousBroker.it  This test is not yet approved or cleared by the [place]  FDA and has been authorized for detection and/or diagnosis of SARS-CoV-2 by FDA under an Emergency Use Authorization (EUA). This EUA will remain in effect (meaning this test can be used) for the duration of the COVID-19 declaration under Section 564(b)(1) of the Act, 21 U.S.C. section 360bbb-3(b)(1), unless the authorization is terminated or revoked.  Performed at [facility], [address], [place], [place] [address]   Urine Culture     Status: Abnormal   Collection Time: 01/11/24 12:30 PM   Specimen: Urine, Clean Catch  Result Value Ref Range Status   Specimen  Description   Final    URINE, CLEAN CATCH Performed at [facility], [address], [place], [place] [address]    Special Requests   Final    NONE Performed at [facility], [address], [place], [place] [address]    Culture MULTIPLE SPECIES PRESENT, SUGGEST RECOLLECTION (A)  Final   Report Status 01/12/2024 FINAL  Final  Respiratory (~20 pathogens) panel by PCR     Status: None   Collection Time: 01/11/24  6:52 PM   Specimen: Nasopharyngeal Swab; Respiratory  Result Value Ref Range Status   Adenovirus NOT DETECTED NOT DETECTED Final   Coronavirus 229E NOT DETECTED NOT DETECTED Final    Comment: (NOTE) The Coronavirus on the Respiratory Panel, DOES NOT test for the novel  Coronavirus (2019 nCoV)    Coronavirus HKU1 NOT DETECTED NOT DETECTED Final   Coronavirus NL63 NOT DETECTED NOT DETECTED Final   Coronavirus OC43 NOT DETECTED NOT DETECTED Final   Metapneumovirus NOT DETECTED NOT DETECTED Final   Rhinovirus / Enterovirus NOT DETECTED NOT DETECTED Final   Influenza A NOT DETECTED NOT DETECTED Final   Influenza B NOT DETECTED NOT DETECTED Final   Parainfluenza Virus 1 NOT DETECTED NOT DETECTED Final   Parainfluenza Virus 2 NOT DETECTED NOT DETECTED Final   Parainfluenza Virus 3 NOT DETECTED NOT DETECTED Final   Parainfluenza Virus 4 NOT DETECTED NOT DETECTED Final   Respiratory Syncytial Virus NOT DETECTED NOT DETECTED Final   Bordetella pertussis NOT DETECTED NOT DETECTED Final   Bordetella Parapertussis NOT DETECTED NOT DETECTED Final   Chlamydophila pneumoniae NOT DETECTED NOT DETECTED Final   Mycoplasma pneumoniae NOT DETECTED NOT DETECTED Final    Comment: Performed at [facility], [address] [place], [place] [address]     Time coordinating discharge: 35 minutes  SIGNED: Mennie LAMY, MD  [facility] 01/16/2024, 4:11 PM  If 7PM-7AM, please contact night-coverage [email or web address]

## 2024-01-17 ENCOUNTER — Other Ambulatory Visit: Payer: Self-pay

## 2024-01-26 ENCOUNTER — Other Ambulatory Visit: Payer: Self-pay | Admitting: Interventional Radiology

## 2024-01-26 DIAGNOSIS — I9789 Other postprocedural complications and disorders of the circulatory system, not elsewhere classified: Secondary | ICD-10-CM

## 2024-01-30 ENCOUNTER — Telehealth: Payer: Self-pay | Admitting: Cardiology

## 2024-01-30 NOTE — Progress Notes (Unsigned)
 Cardiology Clinic Note   Patient Name: Lisa Mcclure Date of Encounter: 01/31/2024  Primary Care Provider:  Yolande Toribio MATSU, MD Primary Cardiologist:  Gordy Bergamo, MD  Patient Profile    Lisa Mcclure 89 year old female presents to the clinic today for follow-up evaluation of her chronic diastolic CHF and severe aortic stenosis.  Past Medical History    Past Medical History:  Diagnosis Date   Acute renal failure superimposed on stage 3b chronic kidney disease (HCC) 06/15/2021   Anemia    h/o hemorrhoidal bleeding and blood transfusion   Aortic stenosis    Chronic diastolic CHF (congestive heart failure) (HCC)    Depression    Diverticulosis    DOE (dyspnea on exertion)    Esophageal reflux    GERD (gastroesophageal reflux disease)    Hypothyroidism    Insomnia    LBP (low back pain)    OAB (overactive bladder)    Obesity    OSA (obstructive sleep apnea)    Osteoarthritis    Paroxysmal atrial fibrillation (HCC)    Rheumatoid arthritis(714.0)    Shoulder pain, bilateral    Unspecified essential hypertension    Venous insufficiency    Past Surgical History:  Procedure Laterality Date   ABDOMINAL AORTIC ENDOVASCULAR STENT GRAFT N/A 12/20/2019   Procedure: Aortogram including catheter selection of aorta and bilateral iliac arteriogram, Endovascular repair of infrarenal abdominal aortic aneurysm with bifurcated stent graft (26 mm x 14 x 12 main body, right bell bottom with a 20 mm x 10 cm piece, and left bell bottom with a 16 mm x 12 cm piece) ;  Surgeon: Gretta Lonni PARAS, MD;  Location: [facility] OR;  Service: Vascular;  Laterality: N/A;   APPENDECTOMY  1953   BIOPSY  06/18/2021   Procedure: BIOPSY;  Surgeon: Saintclair Jasper, MD;  Location: [facility] ENDOSCOPY;  Service: Gastroenterology;;   bladder abduction-1996  1996   breast biopsy Right 1980   CARDIOVERSION N/A 12/24/2019   Procedure: CARDIOVERSION;  Surgeon: Bergamo Gordy, MD;  Location: [facility] ENDOSCOPY;  Service:  Cardiovascular;  Laterality: N/A;   CARDIOVERSION N/A 04/25/2022   Procedure: CARDIOVERSION;  Surgeon: Bergamo Gordy, MD;  Location: [facility] ENDOSCOPY;  Service: Cardiovascular;  Laterality: N/A;   CESAREAN SECTION     1957, 1961, 1964   COSMETIC SURGERY  1996   CYSTOCELE REPAIR     ESOPHAGOGASTRODUODENOSCOPY (EGD) WITH PROPOFOL  N/A 06/18/2021   Procedure: ESOPHAGOGASTRODUODENOSCOPY (EGD) WITH PROPOFOL ;  Surgeon: Saintclair Jasper, MD;  Location: [facility] ENDOSCOPY;  Service: Gastroenterology;  Laterality: N/A;   FLEXIBLE SIGMOIDOSCOPY N/A 04/10/2015   Procedure: FLEXIBLE SIGMOIDOSCOPY;  Surgeon: Elsie Cree, MD;  Location: [facility] ENDOSCOPY;  Service: Endoscopy;  Laterality: N/A;   INTRAMEDULLARY (IM) NAIL INTERTROCHANTERIC Left 02/03/2023   Procedure: LEFT INTRAMEDULLARY (IM) NAIL INTERTROCHANTERIC;  Surgeon: Vernetta Lonni GRADE, MD;  Location: [facility] OR;  Service: Orthopedics;  Laterality: Left;   IR AORTAGRAM ABDOMINAL SERIALOGRAM  10/26/2022   IR EMBO ARTERIAL NOT HEMORR HEMANG INC GUIDE ROADMAPPING  10/24/2022   IR RADIOLOGIST EVAL & MGMT  09/13/2022   IR RADIOLOGIST EVAL & MGMT  11/24/2022   IR US  GUIDE VASC ACCESS LEFT  10/26/2022   IR US  GUIDE VASC ACCESS LEFT  10/26/2022   IR US  GUIDE VASC ACCESS LEFT  10/26/2022   IR US  GUIDE VASC ACCESS LEFT  10/26/2022   knee arthroscopy Right 1996, 2010   KNEE ARTHROSCOPY W/ AUTOGENOUS CARTILAGE IMPLANTATION (ACI) PROCEDURE Left 1994, 1995   REFRACTIVE SURGERY  01/2020  SHOULDER SURGERY  1990   TEE WITHOUT CARDIOVERSION N/A 12/24/2019   Procedure: TRANSESOPHAGEAL ECHOCARDIOGRAM (TEE);  Surgeon: Ladona Heinz, MD;  Location: [facility] ENDOSCOPY;  Service: Cardiovascular;  Laterality: N/A;   TEE WITHOUT CARDIOVERSION N/A 06/23/2022   Procedure: TRANSESOPHAGEAL ECHOCARDIOGRAM (TEE);  Surgeon: Dewane Shiner, DO;  Location: [facility] ENDOSCOPY;  Service: Cardiovascular;  Laterality: N/A;   TOTAL ABDOMINAL HYSTERECTOMY  1972   ULTRASOUND GUIDANCE FOR VASCULAR ACCESS Bilateral 12/20/2019    Procedure: Ultrasound-guided access of bilateral common femoral arteries for delivery of endograft and percutaneous closure;  Surgeon: Gretta Lonni PARAS, MD;  Location: [facility] OR;  Service: Vascular;  Laterality: Bilateral;   VESICOVAGINAL FISTULA CLOSURE W/ TAH     WRIST SURGERY  1967    Allergies  Allergies  Allergen Reactions   Gabapentin Other (See Comments)    Change in mental status    Methocarbamol  Other (See Comments)    Change mental status    Statins Other (See Comments)    Muscle aches and INTERNAL BLEEDING    History of Present Illness    ELASHA Mcclure has a PMH of chronic diastolic CHF, hypertension, paroxysmal atrial fibrillation, obesity, and 3A-B CKD.  Her PMH also includes anemia secondary to hemorrhoids, diverticulosis, esophageal reflux, depression, OSA on CPAP, and rheumatoid arthritis.  She has venous insufficiency as well as aortic and coronary calcifications.  She underwent AAA repair 9/21.  She had pseudoaneurysm sac repair of her AAA 10/24/2022 and repair of the endoleak.  She was being evaluated for TAVR.  She had an accidental fall and ORIF 02/03/2023 of her left hip.  She was doing well with weight loss and watching her diet.  She was using a walker with ambulation.  She was noted to have no symptoms of acute decompensated heart failure.  She was seen in follow-up by Dr. Ladona on 12/14/2023.  During that time she had been seen in the emergency room on 11/22/2023.  She noted dyspnea, leg pain, neuropathy in her legs and generalized weakness.  There was concern for pulmonary embolus.  Chest CT ruled out PE.  There was no evidence of pulmonary edema or heart failure.  Her BNP was minimally elevated at 155.  On follow-up with Dr. Ladona she noted that she was being very careful with her diet.  She had no further lower extremity edema PND and denied orthopnea.  She stated that her quality of life had deteriorated due to macular degeneration and her recent hip fracture.   She was more dependent on people.  She was needing almost around-the-clock care.  Her Farxiga , Entresto  24/26 twice daily and spironolactone  25 mg daily were continued.  She was admitted to the hospital on 01/11/2024 and discharged on 01/16/2024.  She noted that she was having severe chills and was not able to stop shaking.  She contacted EMS.  She was diagnosed with UTI and received oral antibiotics.  She reported lower abdominal pain that had started earlier that morning.  Her pain resolved when she presented to the emergency room.  A CT of her abdomen/pelvis showed no acute abnormalities but did show increasing AAA from 5.9 to 6.7 cm.  Vascular surgery was consulted. She was also diagnosed with acute hypoxic respiratory failure, her cough/congestion was felt to be related to viral illness.  Her cardiac troponins were elevated but flat and attributed to demand ischemia.  She was seen by cardiology and underwent repeat echocardiogram which showed stable moderate-severe aortic stenosis.  Her Entresto  and spironolactone  were  discontinued due to soft blood pressure.  Her metoprolol  and Farxiga  were continued.  During her hospitalization she was noted to have hyperkalemia.  She was seen by nephrology.  Her potassium improved with Lokelma.  She presents to the clinic today for follow-up evaluation and states she was placed back on spironolactone  by her PCP.  He drew lab work post resuming the medication.  I will request this.  She presents to the clinic today with her daughters.  We discussed her hospitalization.  She expressed understanding.  She requests assistance with Farxiga  and Eliquis .  I am unable to resume her Entresto  at this time due to blood pressure.  Pharmacy also met with her to assist with medication costs.  Her blood pressure today is 116/70 and weight remains stable.  Her pulse is 72.  She notes she has follow-up with vascular surgery soon to discuss her AAA. I will plan follow-up in 2  months.  Today she denies chest pain, shortness of breath, lower extremity edema, fatigue, palpitations, melena, hematuria, hemoptysis, diaphoresis, weakness, presyncope, syncope, orthopnea, and PND.   Home Medications    Prior to Admission medications   Medication Sig Start Date End Date Taking? Authorizing Provider  ALEVE ARTHRITIS PAIN 1 % GEL Apply 2 g topically 4 (four) times daily as needed (for pain).    [provider]  apixaban  (ELIQUIS ) 5 MG TABS tablet Take 1 tablet (5 mg total) by mouth 2 (two) times daily. 03/13/23   Ladona Heinz, MD  budesonide  (PULMICORT ) 0.5 MG/2ML nebulizer solution Take 2 mLs (0.5 mg total) by nebulization in the morning and at bedtime. Patient taking differently: Take 0.5 mg by nebulization daily. 03/22/22   Hope Almarie ORN, NP  busPIRone  (BUSPAR ) 7.5 MG tablet Take 7.5 mg by mouth See admin instructions. Take 7.5 mg by mouth at bedtime and an additional 7.5 mg once a day as needed for anxiety    [provider]  Cholecalciferol  (VITAMIN D3) 125 MCG (5000 UT) CAPS Take 1 capsule by mouth daily.    [provider]  dapagliflozin  propanediol (FARXIGA ) 10 MG TABS tablet Take 1 tablet (10 mg total) by mouth daily before breakfast. 12/01/23   Ladona Heinz, MD  diclofenac  sodium (VOLTAREN ) 1 % GEL Apply 2.25 g topically 3 (three) times daily as needed (for pain). Patient not taking: Reported on 01/12/2024 10/23/18   [provider]  furosemide  (LASIX ) 20 MG tablet Take 1 tablet (20 mg total) by mouth daily. 09/25/23   Ladona Heinz, MD  latanoprost  (XALATAN ) 0.005 % ophthalmic solution Place 1 drop into both eyes at bedtime.    [provider]  levothyroxine  (SYNTHROID ) 125 MCG tablet Take 125 mcg by mouth daily before breakfast.     [provider]  melatonin 3 MG TABS tablet Take 3 mg by mouth at bedtime.    [provider]  methocarbamol  (ROBAXIN ) 500 MG tablet Take 500 mg by mouth every 6 (six) hours as  needed for muscle spasms. 06/26/23   [provider]  metoprolol  succinate (TOPROL -XL) 25 MG 24 hr tablet Take 1 tablet (25 mg total) by mouth daily. 01/17/24 02/16/24  Christobal Guadalajara, MD  Multiple Vitamins-Minerals (PRESERVISION AREDS 2 PO) Take 1 capsule by mouth in the morning and at bedtime.    [provider]  nortriptyline  (PAMELOR ) 25 MG capsule Take 25 mg by mouth at bedtime. 08/31/22   [provider]  polyethylene glycol (MIRALAX  / GLYCOLAX ) 17 g packet Take 17 g by mouth  2 (two) times daily. Decrease to daily dosing if developing diarrhea/watery stools Patient taking differently: Take 17 g by mouth daily as needed. Decrease to daily dosing if developing diarrhea/watery stools 02/07/23   Briana Elgin LABOR, MD  rOPINIRole  (REQUIP ) 1 MG tablet Take 1-2 mg by mouth See admin instructions. Take 2mg  by mouth at 1 PM and 1mg  at 9 PM Patient taking differently: Take 1 mg by mouth 3 (three) times daily. 06/19/15   [provider]  SYSTANE COMPLETE PF 0.6 % SOLN Place 1 drop into both eyes 4 (four) times daily as needed (for dryness).    [provider]    Family History    Family History  Problem Relation Age of Onset   Lung cancer Mother    COPD Father    Breast cancer Maternal Aunt    Heart attack Maternal Grandmother 54   Lung cancer Son    She indicated that her mother is deceased. She indicated that her father is deceased. She indicated that her sister is alive. She indicated that her brother is alive. She indicated that her maternal grandmother is deceased. She indicated that her maternal grandfather is deceased. She indicated that her paternal grandmother is deceased. She indicated that her paternal grandfather is deceased. She indicated that her son is deceased. She indicated that the status of her maternal aunt is unknown.  Social History    Social History   Socioeconomic History   Marital status: Widowed    Spouse name: Not on file   Number  of children: 3   Years of education: College   Highest education level: Bachelor's degree (e.g., BA, AB, BS)  Occupational History   Occupation: Retired  Tobacco Use   Smoking status: Former    Current packs/day: 0.00    Average packs/day: 0.2 packs/day for 10.0 years (2.0 ttl pk-yrs)    Types: Cigarettes    Start date: 04/11/1974    Quit date: 04/11/1984    Years since quitting: 39.8    Passive exposure: Past   Smokeless tobacco: Never  Vaping Use   Vaping status: Never Used  Substance and Sexual Activity   Alcohol  use: Yes    Alcohol /week: 0.0 standard drinks of alcohol     Comment: cocktail once a week   Drug use: No   Sexual activity: Not Currently  Other Topics Concern   Not on file  Social History Narrative   Widowed   Lives alone   3 children, 2 living   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, Production designer, theatre/television/film of physician's office   Went to [place] May 2016 for 11 days   Drinks 1 cup of coffee in the morning   Social Drivers of Health   Financial Resource Strain: Low Risk  (08/11/2021)   Overall Financial Resource Strain (CARDIA)    Difficulty of Paying Living Expenses: Not hard at all  Food Insecurity: No Food Insecurity (01/12/2024)   Hunger Vital Sign    Worried About Running Out of Food in the Last Year: Never true    Ran Out of Food in the Last Year: Never true  Transportation Needs: No Transportation Needs (01/12/2024)   PRAPARE - Administrator, Civil Service (Medical): No    Lack of Transportation (Non-Medical): No  Physical Activity: Inactive (08/11/2021)   Exercise Vital Sign    Days of Exercise per Week: 0 days    Minutes of Exercise per Session: 0 min  Stress: No Stress Concern Present (08/11/2021)   [place]  [employer]ational [employer] - Occupational Stress Questionnaire    Feeling of Stress : Only a little  Social Connections: Moderately Integrated (01/11/2024)   Social Connection and Isolation Panel    Frequency of Communication with Friends and  Family: More than three times a week    Frequency of Social Gatherings with Friends and Family: More than three times a week    Attends Religious Services: More than 4 times per year    Active Member of [employer] or Organizations: Yes    Attends Banker Meetings: More than 4 times per year    Marital Status: Widowed  Intimate Partner Violence: Not At Risk (01/12/2024)   Humiliation, Afraid, Rape, and Kick questionnaire    Fear of Current or Ex-Partner: No    Emotionally Abused: No    Physically Abused: No    Sexually Abused: No     Review of Systems    General:  No chills, fever, night sweats or weight changes.  Cardiovascular:  No chest pain, dyspnea on exertion, edema, orthopnea, palpitations, paroxysmal nocturnal dyspnea. Dermatological: No rash, lesions/masses Respiratory: No cough, dyspnea Urologic: No hematuria, dysuria Abdominal:   No nausea, vomiting, diarrhea, bright red blood per rectum, melena, or hematemesis Neurologic:  No visual changes, wkns, changes in mental status. All other systems reviewed and are otherwise negative except as noted above.  Physical Exam    VS:  BP 116/70   Pulse 72   Ht 5' 3.5 (1.613 m)   Wt 193 lb (87.5 kg)   SpO2 100%   BMI 33.65 kg/m  , BMI Body mass index is 33.65 kg/m. GEN: Well nourished, well developed, in no acute distress. HEENT: normal. Neck: Supple, no JVD, carotid bruits, or masses. Cardiac: RRR, no murmurs, rubs, or gallops. No clubbing, cyanosis, edema.  Radials/DP/PT 2+ and equal bilaterally.  Respiratory:  Respirations regular and unlabored, clear to auscultation bilaterally. GI: Soft, nontender, nondistended, BS + x 4. MS: no deformity or atrophy. Skin: warm and dry, no rash. Neuro:  Strength and sensation are intact. Psych: Normal affect.  Accessory Clinical Findings    Recent Labs: 11/22/2023: B Natriuretic Peptide 155.7 01/11/2024: ALT 7; Magnesium  2.4; Pro Brain Natriuretic Peptide 1,619.0; TSH  0.535 01/16/2024: BUN 35; Creatinine, Ser 0.99; Hemoglobin 10.9; Platelets 260; Potassium 5.2; Sodium 134   Recent Lipid Panel    Component Value Date/Time   CHOL 126 03/14/2019 1121   TRIG 82 03/14/2019 1121   HDL 49 03/14/2019 1121   CHOLHDL 5.1 CALC 12/19/2006 0916   VLDL 26 12/19/2006 0916   LDLCALC 61 03/14/2019 1121   LDLDIRECT 159.2 12/19/2006 0916         ECG personally reviewed by me today-none today.    Echocardiogram 01/20/2023  1. Left ventricular ejection fraction, by estimation, is 60 to 65%. The left ventricle has normal function. The left ventricle has no regional wall motion abnormalities. There is moderate concentric left ventricular hypertrophy. Left ventricular diastolic parameters are indeterminate. 2. Right ventricular systolic function is normal. The right ventricular size is normal. 3. Left atrial size was moderately dilated. 4. The mitral valve is degenerative. Mild mitral valve regurgitation. No evidence of mitral stenosis. 5. The aortic valve is tricuspid. There is severe calcifcation of the aortic valve. Aortic valve regurgitation is trivial. Moderate to severe aortic valve stenosis. Aortic valve area, by VTI measures 0.95 cm. Aortic valve mean gradient measures 19.5 mmHg. Aortic valve Vmax measures 2.96 m/s.   Echocardiogram 01/12/2024  IMPRESSIONS  1. Left ventricular ejection fraction, by estimation, is 65 to 70%. The  left ventricle has normal function. The left ventricle has no regional  wall motion abnormalities. Left ventricular diastolic parameters are  indeterminate.   2. Right ventricular systolic function is normal. The right ventricular  size is normal.   3. Left atrial size was severely dilated.   4. Mild mitral valve regurgitation. Moderate to severe mitral annular  calcification.   5. AV is thickened, calcified with restricted motion. Peak and mean  gradients through the valve are 60 and 30 mm hg respectively AVA (VTI) is   0.9 cm2 Dimensionless index is 0.31 OVerall consistent with moderate to  severe AS COmpared to echo from 2024  mean gradient has increased (20 to 30 mm Hg). Aortic valve regurgitation  is mild.   6. The inferior vena cava is dilated in size with <50% respiratory  variability, suggesting right atrial pressure of 15 mmHg.   FINDINGS   Left Ventricle: Left ventricular ejection fraction, by estimation, is 65  to 70%. The left ventricle has normal function. The left ventricle has no  regional wall motion abnormalities. The left ventricular internal cavity  size was normal in size. There is   no left ventricular hypertrophy. Left ventricular diastolic parameters  are indeterminate.   Right Ventricle: The right ventricular size is normal. Right vetricular  wall thickness was not assessed. Right ventricular systolic function is  normal.   Left Atrium: Left atrial size was severely dilated.   Right Atrium: Right atrial size was normal in size.   Pericardium: There is no evidence of pericardial effusion.   Mitral Valve: There is moderate thickening of the mitral valve leaflet(s).  Moderate to severe mitral annular calcification. Mild mitral valve  regurgitation. MV peak gradient, 9.2 mmHg. The mean mitral valve gradient  is 4.0 mmHg.   Tricuspid Valve: The tricuspid valve is normal in structure. Tricuspid  valve regurgitation is trivial.   Aortic Valve: AV is thickened, calcified with restricted motion. Peak and  mean gradients through the valve are 60 and 30 mm hg respectively AVA  (VTI) is 0.9 cm2 Dimensionless index is 0.31 OVerall consistent with  moderate to severe AS COmpared to echo  from 2024 mean gradient has increased (20 to 30 mm Hg). Aortic valve  regurgitation is mild. Aortic regurgitation PHT measures 377 msec. Aortic  valve mean gradient measures 30.0 mmHg. Aortic valve peak gradient  measures 60.8 mmHg. Aortic valve area, by  VTI measures 0.88 cm.   Pulmonic Valve:  The pulmonic valve was not well visualized. Pulmonic valve  regurgitation is not visualized. No evidence of pulmonic stenosis.   Aorta: The aortic root and ascending aorta are structurally normal, with  no evidence of dilitation.   Venous: The inferior vena cava is dilated in size with less than 50%  respiratory variability, suggesting right atrial pressure of 15 mmHg.   IAS/Shunts: No atrial level shunt detected by color flow Doppler.   Assessment & Plan   1.  Chronic diastolic CHF-euvolemic today.  No increased DOE or activity intolerance.  During hospital admission her Entresto  and spironolactone  were discontinued due to hypotension.  She was also noted to have be hyperkalemic. Heart healthy low-sodium diet Fluid restriction Continue Farxiga ,metoprolol , spiro (started by PCP)-request labs  Paroxysmal atrial fibrillation--heart rate today 72.  Reports compliance with apixaban .  Denies bleeding issues or recent trauma.  Continue apixaban , metoprolol   Severe aortic stenosis-3-4/6 systolic murmur heard along left sternal border.  Echocardiogram  01/12/2024 showed LVEF of 65-70%, mild mitral regurgitation, moderate-severe mitral annular calcification, aortic valve calcification with restricted motion and moderate-severe aortic stenosis Continue metoprolol , furosemide  Continue with conservative measures  OSA-continue home oxygen .  Not on CPAP. Avoid supine sleeping  sleep hygiene instructions  COPD-denies increased DOE or shortness of breath. Continue current medical therapy Follows with PCP  CKD stage IIIa-creatinine 0.99 on 01/16/2024. Avoid nephrotoxic agents Following up with nephrology  Disposition: Follow-up with Dr. Ganji or me in 2-3 months.   Josefa HERO. Delayla Hoffmaster NP-C     01/31/2024, 4:40 PM [facility] [address] [place], [place] [address] Office [phone]    Notice: This dictation was prepared with Dragon dictation along  with smaller phrase technology. Any transcriptional errors that result from this process are unintentional and may not be corrected upon review.   I spent 14 minutes examining this patient, reviewing medications, and using patient centered shared decision making involving their cardiac care.   I spent  20 minutes reviewing past medical history,  medications, and prior cardiac tests.

## 2024-01-30 NOTE — Telephone Encounter (Addendum)
 Spoke with pt regarding her appointment tomorrow. Pt states she was confused because someone from Dr. Jacquelin office called to schedule a CTA for her. She is aware that she has an appointment with Lisa Beauvais, NP tomorrow at 2:20pm and she is planning to make this appointment. No further questions at this time.

## 2024-01-30 NOTE — Telephone Encounter (Signed)
 Pt states she was suppose to get images before appt but was unsure. Pt would like a c/b from the nurse please advise

## 2024-01-30 NOTE — Telephone Encounter (Signed)
 Left message for patient to call back

## 2024-01-31 ENCOUNTER — Encounter: Payer: Self-pay | Admitting: General Practice

## 2024-01-31 ENCOUNTER — Ambulatory Visit: Attending: Cardiology | Admitting: General Practice

## 2024-01-31 ENCOUNTER — Other Ambulatory Visit (HOSPITAL_COMMUNITY): Payer: Self-pay

## 2024-01-31 VITALS — BP 116/70 | HR 72 | Ht 63.5 in | Wt 193.0 lb

## 2024-01-31 DIAGNOSIS — I480 Paroxysmal atrial fibrillation: Secondary | ICD-10-CM | POA: Insufficient documentation

## 2024-01-31 DIAGNOSIS — I350 Nonrheumatic aortic (valve) stenosis: Secondary | ICD-10-CM | POA: Diagnosis present

## 2024-01-31 DIAGNOSIS — J439 Emphysema, unspecified: Secondary | ICD-10-CM | POA: Diagnosis present

## 2024-01-31 DIAGNOSIS — G4733 Obstructive sleep apnea (adult) (pediatric): Secondary | ICD-10-CM | POA: Insufficient documentation

## 2024-01-31 DIAGNOSIS — N1832 Chronic kidney disease, stage 3b: Secondary | ICD-10-CM | POA: Insufficient documentation

## 2024-01-31 DIAGNOSIS — I5032 Chronic diastolic (congestive) heart failure: Secondary | ICD-10-CM | POA: Diagnosis not present

## 2024-01-31 NOTE — Patient Instructions (Addendum)
 Medication Instructions:  Your physician recommends that you continue on your current medications as directed. Please refer to the Current Medication list given to you today.  *If you need a refill on your cardiac medications before your next appointment, please call your pharmacy*  Follow-Up: At [facility], you and your health needs are our priority.  As part of our continuing mission to provide you with exceptional heart care, our providers are all part of one team.  This team includes your primary Cardiologist (physician) and Advanced Practice Providers or APPs (Physician Assistants and Nurse Practitioners) who all work together to provide you with the care you need, when you need it.  Your next appointment:   2 month(s)  Provider:   Gordy Bergamo, MD or Josefa Beauvais, NP          We recommend signing up for the patient portal called MyChart.  Sign up information is provided on this After Visit Summary.  MyChart is used to connect with patients for Virtual Visits (Telemedicine).  Patients are able to view lab/test results, encounter notes, upcoming appointments, etc.  Non-urgent messages can be sent to your provider as well.   To learn more about what you can do with MyChart, go to [email or web address].   Other Instructions Please try to increase your physical activity  [place]:

## 2024-02-01 ENCOUNTER — Telehealth: Payer: Self-pay | Admitting: Cardiology

## 2024-02-01 MED ORDER — AMLODIPINE BESYLATE 5 MG PO TABS: 5.0000 mg | ORAL_TABLET | ORAL | 6 refills | Status: DC | PRN

## 2024-02-01 NOTE — Telephone Encounter (Signed)
 Spoke to Home health nurse - Rob  He states her blood pressure is elevated  today   - no symptoms other slight headache Heartrate  80's.  She was seen yesterday  in the office by Josefa Beauvais- 116/70     Home health suggested she goes to ER. Patient decline.   Aware will send to Doctor of the day .

## 2024-02-01 NOTE — Telephone Encounter (Signed)
 Spoke to patient  and home aide  Verbalized understanding  as needed medication to take only if blood pressure is above 140 systolic  Mediation called in to pharmacy  Patient aware will cal back for follow appt with Dr Ladona

## 2024-02-01 NOTE — Telephone Encounter (Signed)
    [employer]  approval az&me 2026

## 2024-02-01 NOTE — Telephone Encounter (Signed)
 Pt c/o BP issue: STAT if pt c/o blurred vision, one-sided weakness or slurred speech.  STAT if BP is GREATER than 180/120 TODAY.  STAT if BP is LESS than 90/60 and SYMPTOMATIC TODAY  1. What is your BP concern?   Caller (Rob) is concerned patient's BP has been trending high  2. Have you taken any BP medication today?  Yes  3. What are your last 5 BP readings?  190/100 - taken today 15 minutes ago 185/99 - taken today 15 minutes ago  4. Are you having any other symptoms (ex. Dizziness, headache, blurred vision, passed out)?   Headache   Caller (Rob) reports patient's BP has been trending high but patient does not want to go to ED.

## 2024-02-12 ENCOUNTER — Encounter: Payer: Self-pay | Admitting: Radiology

## 2024-02-16 ENCOUNTER — Telehealth: Payer: Self-pay

## 2024-02-16 NOTE — Telephone Encounter (Signed)
Yes that works, thank you!

## 2024-02-16 NOTE — Telephone Encounter (Signed)
 Patient Assistance signed by Josefa Beauvais, NP due to Dr Ladona being out of the office

## 2024-02-16 NOTE — Telephone Encounter (Signed)
 Please have provider sign and date completed provider portion for BMS application (see chart media). Please fax the completed form back to [phone]. Thanks in advance for your time!

## 2024-02-16 NOTE — Telephone Encounter (Signed)
 Daughter completed pt portion of BMS app and is requesting provider page to be completed.

## 2024-03-11 ENCOUNTER — Other Ambulatory Visit (HOSPITAL_COMMUNITY): Payer: Self-pay

## 2024-03-13 ENCOUNTER — Other Ambulatory Visit: Payer: Self-pay | Admitting: Cardiology

## 2024-03-13 ENCOUNTER — Telehealth: Payer: Self-pay | Admitting: Family Medicine

## 2024-03-13 NOTE — Telephone Encounter (Signed)
 Patient called and cancelled future appt as she stated that the shoulder injections did not work. She stated she would call if she needed to come in but she did not want to do injections again if they didn't work. FYI

## 2024-03-13 NOTE — Telephone Encounter (Signed)
 Noted

## 2024-03-19 ENCOUNTER — Ambulatory Visit: Attending: Vascular Surgery | Admitting: Vascular Surgery

## 2024-03-19 ENCOUNTER — Encounter: Payer: Self-pay | Admitting: Vascular Surgery

## 2024-03-19 VITALS — BP 134/75 | HR 74 | Temp 98.3°F | Resp 22 | Ht 63.5 in | Wt 193.0 lb

## 2024-03-19 DIAGNOSIS — I7143 Infrarenal abdominal aortic aneurysm, without rupture: Secondary | ICD-10-CM

## 2024-03-19 NOTE — Progress Notes (Signed)
 Patient name: Lisa Mcclure MRN: [id number] DOB: 09-23-1932 Sex: female  REASON FOR VISIT:.  Enlarging AAA after EVAR  HPI: Lisa Mcclure is a 89 y.o. female with multiple medical problems as noted below that presents for follow-up after CTA for enlarging aneurysm sac with suspected type II endoleak after EVAR. I initially met her in the ED when she had new onset abdominal pain that we felt was related to her enlarging AAA.  Her aneurysm was repaired on 12/20/2019 with stent graft when it measured 5.7 cm.  She did get a CTA on 04/14/2020 that showed good seal but she did have evidence of a type II endoleak.   I ultimately sent her to IR and she had coil embolization by Dr. Karalee on 10/24/2022 with direct sac puncture with liquid embolic material when the aneurysm measured about 6.1 cm.  Most recently had a CT on 01/11/2024 with a 6.5 cm AAA when she was in the ED with abdominal pain.  Her abdominal pain resolved.  Past Medical History:  Diagnosis Date   Acute renal failure superimposed on stage 3b chronic kidney disease (HCC) 06/15/2021   Anemia    h/o hemorrhoidal bleeding and blood transfusion   Aortic stenosis    Chronic diastolic CHF (congestive heart failure) (HCC)    Depression    Diverticulosis    DOE (dyspnea on exertion)    Esophageal reflux    GERD (gastroesophageal reflux disease)    Hypothyroidism    Insomnia    LBP (low back pain)    OAB (overactive bladder)    Obesity    OSA (obstructive sleep apnea)    Osteoarthritis    Paroxysmal atrial fibrillation (HCC)    Rheumatoid arthritis(714.0)    Shoulder pain, bilateral    Unspecified essential hypertension    Venous insufficiency     Past Surgical History:  Procedure Laterality Date   ABDOMINAL AORTIC ENDOVASCULAR STENT GRAFT N/A 12/20/2019   Procedure: Aortogram including catheter selection of aorta and bilateral iliac arteriogram, Endovascular repair of infrarenal abdominal aortic aneurysm with bifurcated  stent graft (26 mm x 14 x 12 main body, right bell bottom with a 20 mm x 10 cm piece, and left bell bottom with a 16 mm x 12 cm piece) ;  Surgeon: Gretta Lonni PARAS, MD;  Location: [facility] OR;  Service: Vascular;  Laterality: N/A;   APPENDECTOMY  1953   BIOPSY  06/18/2021   Procedure: BIOPSY;  Surgeon: Saintclair Jasper, MD;  Location: [facility] ENDOSCOPY;  Service: Gastroenterology;;   bladder abduction-1996  1996   breast biopsy Right 1980   CARDIOVERSION N/A 12/24/2019   Procedure: CARDIOVERSION;  Surgeon: Ladona Heinz, MD;  Location: [facility] ENDOSCOPY;  Service: Cardiovascular;  Laterality: N/A;   CARDIOVERSION N/A 04/25/2022   Procedure: CARDIOVERSION;  Surgeon: Ladona Heinz, MD;  Location: [facility] ENDOSCOPY;  Service: Cardiovascular;  Laterality: N/A;   CESAREAN SECTION     1957, 1961, 1964   COSMETIC SURGERY  1996   CYSTOCELE REPAIR     ESOPHAGOGASTRODUODENOSCOPY (EGD) WITH PROPOFOL  N/A 06/18/2021   Procedure: ESOPHAGOGASTRODUODENOSCOPY (EGD) WITH PROPOFOL ;  Surgeon: Saintclair Jasper, MD;  Location: [facility] ENDOSCOPY;  Service: Gastroenterology;  Laterality: N/A;   FLEXIBLE SIGMOIDOSCOPY N/A 04/10/2015   Procedure: FLEXIBLE SIGMOIDOSCOPY;  Surgeon: Elsie Cree, MD;  Location: [facility] ENDOSCOPY;  Service: Endoscopy;  Laterality: N/A;   INTRAMEDULLARY (IM) NAIL INTERTROCHANTERIC Left 02/03/2023   Procedure: LEFT INTRAMEDULLARY (IM) NAIL INTERTROCHANTERIC;  Surgeon: Vernetta Lonni GRADE, MD;  Location: [facility] OR;  Service:  Orthopedics;  Laterality: Left;   IR AORTAGRAM ABDOMINAL SERIALOGRAM  10/26/2022   IR EMBO ARTERIAL NOT HEMORR HEMANG INC GUIDE ROADMAPPING  10/24/2022   IR RADIOLOGIST EVAL & MGMT  09/13/2022   IR RADIOLOGIST EVAL & MGMT  11/24/2022   IR US  GUIDE VASC ACCESS LEFT  10/26/2022   IR US  GUIDE VASC ACCESS LEFT  10/26/2022   IR US  GUIDE VASC ACCESS LEFT  10/26/2022   IR US  GUIDE VASC ACCESS LEFT  10/26/2022   knee arthroscopy Right 1996, 2010   KNEE ARTHROSCOPY W/ AUTOGENOUS CARTILAGE IMPLANTATION (ACI) PROCEDURE Left 1994,  1995   REFRACTIVE SURGERY  01/2020   SHOULDER SURGERY  1990   TEE WITHOUT CARDIOVERSION N/A 12/24/2019   Procedure: TRANSESOPHAGEAL ECHOCARDIOGRAM (TEE);  Surgeon: Ladona Heinz, MD;  Location: [facility] ENDOSCOPY;  Service: Cardiovascular;  Laterality: N/A;   TEE WITHOUT CARDIOVERSION N/A 06/23/2022   Procedure: TRANSESOPHAGEAL ECHOCARDIOGRAM (TEE);  Surgeon: Dewane Shiner, DO;  Location: [facility] ENDOSCOPY;  Service: Cardiovascular;  Laterality: N/A;   TOTAL ABDOMINAL HYSTERECTOMY  1972   ULTRASOUND GUIDANCE FOR VASCULAR ACCESS Bilateral 12/20/2019   Procedure: Ultrasound-guided access of bilateral common femoral arteries for delivery of endograft and percutaneous closure;  Surgeon: Gretta Lonni PARAS, MD;  Location: [facility] OR;  Service: Vascular;  Laterality: Bilateral;   VESICOVAGINAL FISTULA CLOSURE W/ TAH     WRIST SURGERY  1967    Family History  Problem Relation Age of Onset   Lung cancer Mother    COPD Father    Breast cancer Maternal Aunt    Heart attack Maternal Grandmother 69   Lung cancer Son     SOCIAL HISTORY: Social History   Tobacco Use   Smoking status: Former    Current packs/day: 0.00    Average packs/day: 0.2 packs/day for 10.0 years (2.0 ttl pk-yrs)    Types: Cigarettes    Start date: 04/11/1974    Quit date: 04/11/1984    Years since quitting: 39.9    Passive exposure: Past   Smokeless tobacco: Never  Substance Use Topics   Alcohol  use: Yes    Alcohol /week: 0.0 standard drinks of alcohol     Comment: cocktail once a week    Allergies  Allergen Reactions   Gabapentin Other (See Comments)    Change in mental status    Methocarbamol  Other (See Comments)    Change mental status    Statins Other (See Comments)    Muscle aches and INTERNAL BLEEDING    Current Outpatient Medications  Medication Sig Dispense Refill   ALEVE ARTHRITIS PAIN 1 % GEL Apply 2 g topically 4 (four) times daily as needed (for pain).     amLODipine  (NORVASC ) 5 MG tablet Take 1 tablet (5 mg total)  by mouth as needed. For systolic blood pressure >140 daily. 90 tablet 3   apixaban  (ELIQUIS ) 5 MG TABS tablet Take 1 tablet (5 mg total) by mouth 2 (two) times daily. 180 tablet 1   budesonide  (PULMICORT ) 0.5 MG/2ML nebulizer solution Take 2 mLs (0.5 mg total) by nebulization in the morning and at bedtime. (Patient taking differently: Take 0.5 mg by nebulization daily.) 120 mL 1   busPIRone  (BUSPAR ) 7.5 MG tablet Take 7.5 mg by mouth See admin instructions. Take 7.5 mg by mouth at bedtime and an additional 7.5 mg once a day as needed for anxiety     Cholecalciferol  (VITAMIN D3) 125 MCG (5000 UT) CAPS Take 1 capsule by mouth daily.     dapagliflozin  propanediol (FARXIGA ) 10 MG  TABS tablet Take 1 tablet (10 mg total) by mouth daily before breakfast. 90 tablet 3   diclofenac  sodium (VOLTAREN ) 1 % GEL Apply 2.25 g topically 3 (three) times daily as needed (for pain).     furosemide  (LASIX ) 20 MG tablet Take 1 tablet (20 mg total) by mouth daily. 90 tablet 3   latanoprost  (XALATAN ) 0.005 % ophthalmic solution Place 1 drop into both eyes at bedtime.     levothyroxine  (SYNTHROID ) 125 MCG tablet Take 125 mcg by mouth daily before breakfast.      melatonin 3 MG TABS tablet Take 3 mg by mouth at bedtime.     methocarbamol  (ROBAXIN ) 500 MG tablet Take 500 mg by mouth every 6 (six) hours as needed for muscle spasms.     metoprolol  succinate (TOPROL -XL) 25 MG 24 hr tablet Take 1 tablet (25 mg total) by mouth daily. 30 tablet 0   Multiple Vitamins-Minerals (PRESERVISION AREDS 2 PO) Take 1 capsule by mouth in the morning and at bedtime.     nortriptyline  (PAMELOR ) 25 MG capsule Take 25 mg by mouth at bedtime.     polyethylene glycol (MIRALAX  / GLYCOLAX ) 17 g packet Take 17 g by mouth 2 (two) times daily. Decrease to daily dosing if developing diarrhea/watery stools (Patient taking differently: Take 17 g by mouth daily as needed. Decrease to daily dosing if developing diarrhea/watery stools)     rOPINIRole  (REQUIP )  1 MG tablet Take 1-2 mg by mouth See admin instructions. Take 2mg  by mouth at 1 PM and 1mg  at 9 PM (Patient taking differently: Take 1 mg by mouth 3 (three) times daily.)  12   SYSTANE COMPLETE PF 0.6 % SOLN Place 1 drop into both eyes 4 (four) times daily as needed (for dryness).     No current facility-administered medications for this visit.    REVIEW OF SYSTEMS:  [X]  denotes positive finding, [ ]  denotes negative finding Cardiac  Comments:  Chest pain or chest pressure:    Shortness of breath upon exertion:    Short of breath when lying flat:    Irregular heart rhythm:        Vascular    Pain in calf, thigh, or hip brought on by ambulation:    Pain in feet at night that wakes you up from your sleep:     Blood clot in your veins:    Leg swelling:         Pulmonary    Oxygen  at home:    Productive cough:     Wheezing:         Neurologic    Sudden weakness in arms or legs:     Sudden numbness in arms or legs:     Sudden onset of difficulty speaking or slurred speech:    Temporary loss of vision in one eye:     Problems with dizziness:         Gastrointestinal    Blood in stool:     Vomited blood:         Genitourinary    Burning when urinating:     Blood in urine:        Psychiatric    Major depression:         Hematologic    Bleeding problems:    Problems with blood clotting too easily:        Skin    Rashes or ulcers:        Constitutional    Fever or  chills:      PHYSICAL EXAM: There were no vitals filed for this visit.   GENERAL: The patient is a well-nourished female, in no acute distress. The vital signs are documented above. CARDIAC: There is a regular rate and rhythm.  VASCULAR:  Palpable femoral pulses bilaterally ABDOMEN: soft, NT, ND.  DATA:   CT abdomen pelvis reviewed 01/11/2024 with stent graft in good position.  Noted embolic glue coil material in aneurysm sac with no evidence of endoleak.  Aneurysm on my review coronal and sagittal  measures 6.5 cm  Assessment/Plan:  89 year old female presents for follow-up after CTA for evaluation of enlarging aneurysm sac after previous EVAR.  Her EVAR was done 12/20/2019 for symptomatic 5.7 cm aneurysm.  Last year her aneurysm increased to 6.1 cm and ultimately she got direct sac puncture with embolic glue due to a type II endoleak by IR.  She was sent back to me given the CT obtained in the ED on 01/11/24 where it measured 6.7 cm.  I have looked at the imaging and I measured her aneurysm at about 6.5 cm.  I do not see any evidence of ongoing endoleak and the embolic glue is noted from the direct sac puncture by IR.  I would recommend no further intervention at this time as this appears to have been treated for now.  I will follow-up in 6 months with CTA for better evaluation and ongoing surveillance.    Lonni DOROTHA Gaskins, MD [facility] Office: [phone]

## 2024-03-26 ENCOUNTER — Ambulatory Visit: Admitting: Vascular Surgery

## 2024-03-28 ENCOUNTER — Ambulatory Visit: Admitting: Family Medicine

## 2024-04-09 ENCOUNTER — Emergency Department (HOSPITAL_COMMUNITY)

## 2024-04-09 ENCOUNTER — Encounter (HOSPITAL_COMMUNITY): Payer: Self-pay

## 2024-04-09 ENCOUNTER — Inpatient Hospital Stay (HOSPITAL_COMMUNITY)
Admission: EM | Admit: 2024-04-09 | Discharge: 2024-04-11 | DRG: 308 | Disposition: A | Discharge status: Home Health | Attending: Internal Medicine | Admitting: Internal Medicine

## 2024-04-09 ENCOUNTER — Other Ambulatory Visit: Payer: Self-pay

## 2024-04-09 DIAGNOSIS — I130 Hypertensive heart and chronic kidney disease with heart failure and stage 1 through stage 4 chronic kidney disease, or unspecified chronic kidney disease: Secondary | ICD-10-CM | POA: Diagnosis present

## 2024-04-09 DIAGNOSIS — I480 Paroxysmal atrial fibrillation: Principal | ICD-10-CM | POA: Diagnosis present

## 2024-04-09 DIAGNOSIS — E871 Hypo-osmolality and hyponatremia: Secondary | ICD-10-CM | POA: Diagnosis present

## 2024-04-09 DIAGNOSIS — Z87891 Personal history of nicotine dependence: Secondary | ICD-10-CM

## 2024-04-09 DIAGNOSIS — E785 Hyperlipidemia, unspecified: Secondary | ICD-10-CM | POA: Diagnosis present

## 2024-04-09 DIAGNOSIS — Z90710 Acquired absence of both cervix and uterus: Secondary | ICD-10-CM

## 2024-04-09 DIAGNOSIS — F32A Depression, unspecified: Secondary | ICD-10-CM | POA: Diagnosis present

## 2024-04-09 DIAGNOSIS — I440 Atrioventricular block, first degree: Secondary | ICD-10-CM | POA: Diagnosis present

## 2024-04-09 DIAGNOSIS — Z993 Dependence on wheelchair: Secondary | ICD-10-CM

## 2024-04-09 DIAGNOSIS — Z7901 Long term (current) use of anticoagulants: Secondary | ICD-10-CM

## 2024-04-09 DIAGNOSIS — N1832 Chronic kidney disease, stage 3b: Secondary | ICD-10-CM | POA: Diagnosis present

## 2024-04-09 DIAGNOSIS — Z7409 Other reduced mobility: Secondary | ICD-10-CM | POA: Diagnosis present

## 2024-04-09 DIAGNOSIS — E039 Hypothyroidism, unspecified: Secondary | ICD-10-CM | POA: Diagnosis present

## 2024-04-09 DIAGNOSIS — E875 Hyperkalemia: Secondary | ICD-10-CM | POA: Diagnosis present

## 2024-04-09 DIAGNOSIS — G629 Polyneuropathy, unspecified: Secondary | ICD-10-CM | POA: Diagnosis present

## 2024-04-09 DIAGNOSIS — Z79899 Other long term (current) drug therapy: Secondary | ICD-10-CM | POA: Diagnosis not present

## 2024-04-09 DIAGNOSIS — I4891 Unspecified atrial fibrillation: Secondary | ICD-10-CM | POA: Diagnosis not present

## 2024-04-09 DIAGNOSIS — I361 Nonrheumatic tricuspid (valve) insufficiency: Secondary | ICD-10-CM | POA: Diagnosis not present

## 2024-04-09 DIAGNOSIS — I509 Heart failure, unspecified: Secondary | ICD-10-CM | POA: Diagnosis present

## 2024-04-09 DIAGNOSIS — Z79890 Hormone replacement therapy (postmenopausal): Secondary | ICD-10-CM

## 2024-04-09 DIAGNOSIS — J449 Chronic obstructive pulmonary disease, unspecified: Secondary | ICD-10-CM | POA: Diagnosis present

## 2024-04-09 DIAGNOSIS — Z888 Allergy status to other drugs, medicaments and biological substances status: Secondary | ICD-10-CM

## 2024-04-09 DIAGNOSIS — Z8249 Family history of ischemic heart disease and other diseases of the circulatory system: Secondary | ICD-10-CM

## 2024-04-09 DIAGNOSIS — Z66 Do not resuscitate: Secondary | ICD-10-CM | POA: Diagnosis present

## 2024-04-09 DIAGNOSIS — I340 Nonrheumatic mitral (valve) insufficiency: Secondary | ICD-10-CM | POA: Diagnosis not present

## 2024-04-09 DIAGNOSIS — M069 Rheumatoid arthritis, unspecified: Secondary | ICD-10-CM | POA: Diagnosis present

## 2024-04-09 DIAGNOSIS — I872 Venous insufficiency (chronic) (peripheral): Secondary | ICD-10-CM | POA: Diagnosis present

## 2024-04-09 DIAGNOSIS — G4730 Sleep apnea, unspecified: Secondary | ICD-10-CM | POA: Diagnosis not present

## 2024-04-09 DIAGNOSIS — I739 Peripheral vascular disease, unspecified: Secondary | ICD-10-CM | POA: Diagnosis present

## 2024-04-09 DIAGNOSIS — I5032 Chronic diastolic (congestive) heart failure: Secondary | ICD-10-CM

## 2024-04-09 DIAGNOSIS — I350 Nonrheumatic aortic (valve) stenosis: Secondary | ICD-10-CM | POA: Diagnosis present

## 2024-04-09 DIAGNOSIS — I110 Hypertensive heart disease with heart failure: Secondary | ICD-10-CM | POA: Diagnosis not present

## 2024-04-09 DIAGNOSIS — R001 Bradycardia, unspecified: Secondary | ICD-10-CM | POA: Diagnosis present

## 2024-04-09 DIAGNOSIS — I2510 Atherosclerotic heart disease of native coronary artery without angina pectoris: Secondary | ICD-10-CM | POA: Diagnosis present

## 2024-04-09 DIAGNOSIS — G4733 Obstructive sleep apnea (adult) (pediatric): Secondary | ICD-10-CM | POA: Diagnosis present

## 2024-04-09 DIAGNOSIS — I5033 Acute on chronic diastolic (congestive) heart failure: Secondary | ICD-10-CM | POA: Diagnosis present

## 2024-04-09 DIAGNOSIS — I5031 Acute diastolic (congestive) heart failure: Secondary | ICD-10-CM | POA: Diagnosis not present

## 2024-04-09 DIAGNOSIS — F419 Anxiety disorder, unspecified: Secondary | ICD-10-CM | POA: Diagnosis present

## 2024-04-09 LAB — CBC WITH DIFFERENTIAL/PLATELET
Abs Immature Granulocytes: 0.08 K/uL — ABNORMAL HIGH (ref 0.00–0.07)
Basophils Absolute: 0.1 K/uL (ref 0.0–0.1)
Basophils Relative: 1 %
Eosinophils Absolute: 0.1 K/uL (ref 0.0–0.5)
Eosinophils Relative: 1 %
HCT: 38.5 % (ref 36.0–46.0)
Hemoglobin: 11.6 g/dL — ABNORMAL LOW (ref 12.0–15.0)
Immature Granulocytes: 1 %
Lymphocytes Relative: 12 %
Lymphs Abs: 1.6 K/uL (ref 0.7–4.0)
MCH: 27.8 pg (ref 26.0–34.0)
MCHC: 30.1 g/dL (ref 30.0–36.0)
MCV: 92.3 fL (ref 80.0–100.0)
Monocytes Absolute: 1.1 K/uL — ABNORMAL HIGH (ref 0.1–1.0)
Monocytes Relative: 8 %
Neutro Abs: 10 K/uL — ABNORMAL HIGH (ref 1.7–7.7)
Neutrophils Relative %: 77 %
Platelets: 321 K/uL (ref 150–400)
RBC: 4.17 MIL/uL (ref 3.87–5.11)
RDW: 14.7 % (ref 11.5–15.5)
WBC: 13 K/uL — ABNORMAL HIGH (ref 4.0–10.5)
nRBC: 0 % (ref 0.0–0.2)

## 2024-04-09 LAB — TROPONIN T, HIGH SENSITIVITY
Troponin T High Sensitivity: 46 ng/L — ABNORMAL HIGH (ref 0–19)
Troponin T High Sensitivity: 49 ng/L — ABNORMAL HIGH (ref 0–19)

## 2024-04-09 LAB — COMPREHENSIVE METABOLIC PANEL WITH GFR
ALT: 16 U/L (ref 0–44)
AST: 24 U/L (ref 15–41)
Albumin: 3.5 g/dL (ref 3.5–5.0)
Alkaline Phosphatase: 76 U/L (ref 38–126)
Anion gap: 12 (ref 5–15)
BUN: 37 mg/dL — ABNORMAL HIGH (ref 8–23)
CO2: 18 mmol/L — ABNORMAL LOW (ref 22–32)
Calcium: 8.6 mg/dL — ABNORMAL LOW (ref 8.9–10.3)
Chloride: 103 mmol/L (ref 98–111)
Creatinine, Ser: 1.02 mg/dL — ABNORMAL HIGH (ref 0.44–1.00)
GFR, Estimated: 52 mL/min — ABNORMAL LOW
Glucose, Bld: 104 mg/dL — ABNORMAL HIGH (ref 70–99)
Potassium: 5.5 mmol/L — ABNORMAL HIGH (ref 3.5–5.1)
Sodium: 133 mmol/L — ABNORMAL LOW (ref 135–145)
Total Bilirubin: 0.4 mg/dL (ref 0.0–1.2)
Total Protein: 7.3 g/dL (ref 6.5–8.1)

## 2024-04-09 LAB — PRO BRAIN NATRIURETIC PEPTIDE: Pro Brain Natriuretic Peptide: 8627 pg/mL — ABNORMAL HIGH

## 2024-04-09 MED ORDER — BUSPIRONE HCL 15 MG PO TABS
7.5000 mg | ORAL_TABLET | Freq: Every day | ORAL | Status: DC
  Administered 2024-04-09 – 2024-04-10 (×2): 7.5 mg via ORAL
  Filled 2024-04-09 (×4): qty 1

## 2024-04-09 MED ORDER — POLYETHYLENE GLYCOL 3350 17 G PO PACK: 17.0000 g | PACK | Freq: Every day | ORAL | Status: DC | PRN

## 2024-04-09 MED ORDER — METOPROLOL TARTRATE 12.5 MG HALF TABLET
12.5000 mg | ORAL_TABLET | Freq: Four times a day (QID) | ORAL | Status: DC
  Administered 2024-04-09 – 2024-04-11 (×8): 12.5 mg via ORAL
  Filled 2024-04-09 (×8): qty 1

## 2024-04-09 MED ORDER — ROPINIROLE HCL 1 MG PO TABS
1.0000 mg | ORAL_TABLET | Freq: Three times a day (TID) | ORAL | Status: DC
  Administered 2024-04-09 – 2024-04-11 (×5): 1 mg via ORAL
  Filled 2024-04-09 (×7): qty 1

## 2024-04-09 MED ORDER — ALBUTEROL SULFATE (2.5 MG/3ML) 0.083% IN NEBU: 2.5000 mg | INHALATION_SOLUTION | Freq: Four times a day (QID) | RESPIRATORY_TRACT | Status: DC | PRN

## 2024-04-09 MED ORDER — ACETAMINOPHEN 650 MG RE SUPP: 650.0000 mg | Freq: Four times a day (QID) | RECTAL | Status: DC | PRN

## 2024-04-09 MED ORDER — APIXABAN 5 MG PO TABS
5.0000 mg | ORAL_TABLET | Freq: Two times a day (BID) | ORAL | Status: DC
  Administered 2024-04-09 – 2024-04-11 (×3): 5 mg via ORAL
  Filled 2024-04-09 (×3): qty 1

## 2024-04-09 MED ORDER — DILTIAZEM HCL-DEXTROSE 125-5 MG/125ML-% IV SOLN (PREMIX)
5.0000 mg/h | INTRAVENOUS | Status: DC
  Administered 2024-04-09: 5 mg/h via INTRAVENOUS
  Filled 2024-04-09: qty 125

## 2024-04-09 MED ORDER — DILTIAZEM LOAD VIA INFUSION
10.0000 mg | Freq: Once | INTRAVENOUS | Status: AC
  Administered 2024-04-09: 10 mg via INTRAVENOUS
  Filled 2024-04-09: qty 10

## 2024-04-09 MED ORDER — ACETAMINOPHEN 325 MG PO TABS
650.0000 mg | ORAL_TABLET | Freq: Four times a day (QID) | ORAL | Status: DC | PRN
  Administered 2024-04-11: 650 mg via ORAL
  Filled 2024-04-09 (×3): qty 2

## 2024-04-09 MED ORDER — HYDRALAZINE HCL 20 MG/ML IJ SOLN: 10.0000 mg | Freq: Four times a day (QID) | INTRAMUSCULAR | Status: DC | PRN

## 2024-04-09 MED ORDER — FUROSEMIDE 10 MG/ML IJ SOLN
40.0000 mg | Freq: Once | INTRAMUSCULAR | Status: AC
  Administered 2024-04-09: 40 mg via INTRAVENOUS
  Filled 2024-04-09: qty 4

## 2024-04-09 MED ORDER — BISACODYL 5 MG PO TBEC: 5.0000 mg | DELAYED_RELEASE_TABLET | Freq: Every day | ORAL | Status: DC | PRN

## 2024-04-09 NOTE — ED Provider Notes (Signed)
 " [place] [facility]ERGENCY DEPARTMENT AT [facility] [place] Provider Note   CSN: [id number] Arrival date & time: 04/09/24  1209     Patient presents with: Atrial Flutter   Lisa Mcclure is a 89 y.o. female.   Pt is an 89 y.o. 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  Patient reports on Christmas Day she had chest discomfort the entire day which is unusual for her but she did not want to bother anyone so she took Tums and stomach medicine and woke up on the 26 without pain but reports since that time she has had gradually worsening shortness of breath.  She has gained 10 pounds and made an appointment with her doctor today due to the ongoing symptoms.  She has a chronic cough but denies any fever or sputum production.  She has been compliant with her medications although she reports she did not take any of her medicines last night or this morning.  She denies any abdominal pain or vomiting.  When she arrived at the doctor's office she was found to be in A-fib with RVR and was sent to the emergency room.  The history is provided by the patient.       Prior to Admission medications  Medication Sig Start Date End Date Taking? Authorizing Provider  ALEVE ARTHRITIS PAIN 1 % GEL Apply 2 g topically 4 (four) times daily as needed (for pain).    [provider]  amLODipine  (NORVASC ) 5 MG tablet Take 1 tablet (5 mg total) by mouth as needed. For systolic blood pressure >140 daily. 03/19/24 06/17/24  Ladona Heinz, MD  apixaban  (ELIQUIS ) 5 MG TABS tablet Take 1 tablet (5 mg total) by mouth 2 (two) times daily. 03/13/23   Ladona Heinz, MD  budesonide  (PULMICORT ) 0.5 MG/2ML nebulizer solution  Take 2 mLs (0.5 mg total) by nebulization in the morning and at bedtime. Patient taking differently: Take 0.5 mg by nebulization daily. 03/22/22   Hope Almarie ORN, NP  busPIRone  (BUSPAR ) 7.5 MG tablet Take 7.5 mg by mouth See admin instructions. Take 7.5 mg by mouth at bedtime and an additional 7.5 mg once a day as needed for anxiety    [provider]  Cholecalciferol  (VITAMIN D3) 125 MCG (5000 UT) CAPS Take 1 capsule by mouth daily.    [provider]  dapagliflozin  propanediol (FARXIGA ) 10 MG TABS tablet Take 1 tablet (10 mg total) by mouth daily before breakfast. 12/01/23   Ladona Heinz, MD  diclofenac  sodium (VOLTAREN ) 1 % GEL Apply 2.25 g topically 3 (three) times daily as needed (for pain). 10/23/18   [provider]  furosemide  (LASIX ) 20 MG tablet Take 1 tablet (20 mg total) by mouth daily. 09/25/23   Ladona Heinz, MD  latanoprost  (XALATAN ) 0.005 % ophthalmic solution Place 1 drop into both eyes at bedtime.    [provider]  levothyroxine  (SYNTHROID ) 125 MCG tablet Take 125 mcg by mouth daily before breakfast.     [provider]  melatonin 3 MG TABS tablet Take 3 mg by mouth at bedtime.    [provider]  methocarbamol  (ROBAXIN ) 500 MG tablet Take 500 mg by mouth every 6 (six) hours as needed for  muscle spasms. 06/26/23   [provider]  metoprolol  succinate (TOPROL -XL) 25 MG 24 hr tablet Take 1 tablet (25 mg total) by mouth daily. 01/17/24 03/19/24  Christobal Guadalajara, MD  Multiple Vitamins-Minerals (PRESERVISION AREDS 2 PO) Take 1 capsule by mouth in the morning and at bedtime.    [provider]  nortriptyline  (PAMELOR ) 25 MG capsule Take 25 mg by mouth at bedtime. 08/31/22   [provider]  polyethylene glycol (MIRALAX  / GLYCOLAX ) 17 g packet Take 17 g by mouth 2 (two) times daily. Decrease to daily dosing if developing diarrhea/watery stools Patient taking differently: Take 17 g by mouth daily as needed. Decrease to  daily dosing if developing diarrhea/watery stools 02/07/23   Briana Elgin LABOR, MD  rOPINIRole  (REQUIP ) 1 MG tablet Take 1-2 mg by mouth See admin instructions. Take 2mg  by mouth at 1 PM and 1mg  at 9 PM Patient taking differently: Take 1 mg by mouth 3 (three) times daily. 06/19/15   [provider]  SYSTANE COMPLETE PF 0.6 % SOLN Place 1 drop into both eyes 4 (four) times daily as needed (for dryness).    [provider]    Allergies: Gabapentin, Methocarbamol , and Statins    Review of Systems  Updated Vital Signs BP (!) 74/63   Pulse (!) 135   Temp 98.4 F (36.9 C) (Oral)   Resp 18   Ht 5' 3.5 (1.613 m)   Wt 87.5 kg   SpO2 96%   BMI 33.65 kg/m   Physical Exam Vitals and nursing note reviewed.  Constitutional:      General: She is not in acute distress.    Appearance: She is well-developed.  HENT:     Head: Normocephalic and atraumatic.  Eyes:     Pupils: Pupils are equal, round, and reactive to light.  Cardiovascular:     Rate and Rhythm: Tachycardia present. Rhythm irregularly irregular.     Pulses: Normal pulses.     Heart sounds: Normal heart sounds. No murmur heard.    No friction rub.  Pulmonary:     Effort: Pulmonary effort is normal. Tachypnea present.     Breath sounds: Examination of the right-lower field reveals rales. Examination of the left-lower field reveals rales. Rales present. No wheezing.  Abdominal:     General: Bowel sounds are normal. There is no distension.     Palpations: Abdomen is soft.     Tenderness: There is no abdominal tenderness. There is no guarding or rebound.  Musculoskeletal:        General: No tenderness. Normal range of motion.     Comments: Trace edema in the ankles.  Patient has cyanosis of bilateral feet and cool to the touch with a history of PAD.  Skin:    General: Skin is warm and dry.     Findings: No rash.  Neurological:     Mental Status: She is alert and oriented to person, place, and time. Mental status  is at baseline.     Cranial Nerves: No cranial nerve deficit.  Psychiatric:        Behavior: Behavior normal.     (all labs ordered are listed, but only abnormal results are displayed) Labs Reviewed  CBC WITH DIFFERENTIAL/PLATELET - Abnormal; Notable for the following components:      Result Value   WBC 13.0 (*)    Hemoglobin 11.6 (*)    Neutro Abs 10.0 (*)    Monocytes Absolute 1.1 (*)    Abs Immature  Granulocytes 0.08 (*)    All other components within normal limits  COMPREHENSIVE METABOLIC PANEL WITH GFR - Abnormal; Notable for the following components:   Sodium 133 (*)    Potassium 5.5 (*)    CO2 18 (*)    Glucose, Bld 104 (*)    BUN 37 (*)    Creatinine, Ser 1.02 (*)    Calcium 8.6 (*)    GFR, Estimated 52 (*)    All other components within normal limits  PRO BRAIN NATRIURETIC PEPTIDE - Abnormal; Notable for the following components:   Pro Brain Natriuretic Peptide 8,627.0 (*)    All other components within normal limits  TROPONIN T, HIGH SENSITIVITY - Abnormal; Notable for the following components:   Troponin T High Sensitivity 46 (*)    All other components within normal limits  TROPONIN T, HIGH SENSITIVITY    EKG: EKG Interpretation Date/Time:  Tuesday April 09 2024 12:21:54 EST Ventricular Rate:  159 PR Interval:    QRS Duration:  91 QT Interval:  286 QTC Calculation: 466 R Axis:   13  Text Interpretation: recurrent Atrial fibrillation with rapid V-rate Low voltage, precordial leads Confirmed by Doretha Folks ([id number]) on 04/09/2024 1:18:24 PM  Radiology: [facility] Chest Port 1 View Result Date: 04/09/2024 EXAM: 1 VIEW(S) XRAY OF THE CHEST 04/09/2024 01:10:47 PM COMPARISON: 01/11/2024 CLINICAL HISTORY: Shortness of breath and palpitations. FINDINGS: LIMITATIONS/ARTIFACTS: Rotational artifact. LUNGS AND PLEURA: Similar chronic interstitial thickening. No focal pulmonary opacity. No pleural effusion. No pneumothorax. HEART AND MEDIASTINUM: Cardiomegaly. Aortic  atherosclerosis. BONES AND SOFT TISSUES: Severe degenerative changes of bilateral shoulders. Remote bilateral rib fractures. Thoracic spondylosis. IMPRESSION: 1. No acute cardiopulmonary abnormality. 2. Cardiomegaly with similar chronic interstitial thickening. Electronically signed by: Waddell Calk MD 04/09/2024 02:29 PM EST RP Workstation: [id number]     Procedures   Medications Ordered in the ED  diltiazem  (CARDIZEM ) 1 mg/mL load via infusion 10 mg (10 mg Intravenous Bolus from Bag 04/09/24 1252)    And  diltiazem  (CARDIZEM ) 125 mg in dextrose  5% 125 mL (1 mg/mL) infusion (0 mg/hr Intravenous Stopped 04/09/24 1345)  furosemide  (LASIX ) injection 40 mg (has no administration in time range)                                    Medical Decision Making Amount and/or Complexity of Data Reviewed Independent Historian: EMS External Data Reviewed: notes. Labs: ordered. Decision-making details documented in ED Course. Radiology: ordered and independent interpretation performed. Decision-making details documented in ED Course. ECG/medicine tests: ordered and independent interpretation performed. Decision-making details documented in ED Course.  Risk Prescription drug management.   Pt with multiple medical problems and comorbidities and presenting today with a complaint that caries a high risk for morbidity and mortality.  Here today with the above complaints.  Upon arrival patient is found to be in atrial fibrillation with RVR.  Based on my independent interpretation of continuous cardiac monitoring.  Feel that that could be contributing to patient's shortness of breath but also concern for CHF in the setting of ongoing atrial fibrillation as well as concern for possible ACS which started her symptoms when she was having chest discomfort.  Lower suspicion for PE and patient is not having infectious symptoms concerning for pneumonia or sepsis.   2:44 PM I independently interpreted patient's labs  and EKG.  EKG with atrial fibrillation but no other significant ST changes concerning for ACS.  CBC  with minimal leukocytosis of 13, hemoglobin stable at 11 and normal platelet count, CMP with a potassium of 5.5, sodium of 133 and creatinine of 1 with normal LFTs, troponin is 46 and BNP is 8600. I have independently visualized and interpreted pt's images today.  Chest x-ray with pulmonary edema and persistent cardiomegaly. Patient does take diuretics regularly and will give a dose today as she does appear fluid overloaded. Attempted to start patient on a Cardizem  drip but blood pressure did not tolerate and heart rate was not significantly improved.  Patient has missed 2 doses of her Eliquis  in the last 24 hours. After Cardizem  was discontinued patient's blood pressure has now improved and is back to normal.  She does not seem to tolerate this due to her aortic stenosis.  Discussed with Dr. Lavona and at this time cardiology will consult but he did not feel that she should be cardioverted in the emergency room.  Feel that we need to manage her CHF first.  She was given IV Lasix .  Heart rate ranges from 110-130.  Discussed all this with the patient and will admit to the hospitalist service.  Patient is comfortable with this plan. CRITICAL CARE Performed by: Angelle Isais Total critical care time: 30 minutes Critical care time was exclusive of separately billable procedures and treating other patients. Critical care was necessary to treat or prevent imminent or life-threatening deterioration. Critical care was time spent personally by me on the following activities: development of treatment plan with patient and/or surrogate as well as nursing, discussions with consultants, evaluation of patient's response to treatment, examination of patient, obtaining history from patient or surrogate, ordering and performing treatments and interventions, ordering and review of laboratory studies, ordering and review  of radiographic studies, pulse oximetry and re-evaluation of patient's condition.     Final diagnoses:  Atrial fibrillation with RVR (HCC)  Acute on chronic congestive heart failure, unspecified heart failure type [facility])    ED Discharge Orders     None          Doretha Folks, MD 04/09/24 1444  "

## 2024-04-09 NOTE — ED Provider Notes (Signed)
 Hospitalist service is aware of case and will evaluate for admission.   Laurice Maude BROCKS, MD 04/09/24 [phone]

## 2024-04-09 NOTE — Consult Note (Addendum)
 "  Cardiology Consultation   [name] MRN: [id number]; DOB: 1932/12/12  Admit date: 04/09/2024 Date of Consult: 04/09/2024  PCP:  Yolande Toribio MATSU, MD   [facility] Providers Cardiologist:  Gordy Bergamo, MD        Patient Profile: Lisa Mcclure is a 89 y.o. female with a hx of hypertension, hypothyroidism, coronary artery calcifications, paroxysmal atrial fibrillation s/p DCCV on 04/2022, CKD stage III, OSA not on CPAP, chronic diastolic heart failure,  AAA repair on 12/2019, AAA pseudo aneurysm sac repair on 10/2022 (6.5 cm on CT on 01/2024 followed by CT surgery), and moderate-severe aortic stenosis (previously declined TAVR and preferred medical management) who is being seen 04/09/2024 for the evaluation of atrial fibrillation with RVR at the request of Benton Shone MD.  History of Present Illness: Lisa Mcclure is an 89 year old female with prior cardiac history listed above.  On 01/2023 was hospitalized following a fall.  Found to have a left intertrochanteric fracture and required ORIF.  Since having her hip fracture the patient has had persistent weakness and is essentially wheelchair-bound.  Has required multiple aides to assist with her care.  On 11/2023 patient presented to the emergency department for shortness of breath and weakness.  CTPA was done and ruled out a PE.  On 01/2024 the patient was hospitalized for sepsis and suspected viral illness.  Cardiology was consulted but felt like her symptoms, imaging, and labs were more consistent with viral pneumonia than advanced heart failure.  Echo at this time showed a normal LVEF of 65 to 70%, no RWMA, severe left atrial dilation, moderate to severe MAC, mild MR, calcified aortic valve with a VTI of 0.9 and a DI of 0.31, and mild aortic regurgitation.  Patient presented to the hospital for chest pain and shortness of breath.  EKG showed that the patient was in A-fib with RVR.  The patient was  started on IV Cardizem .  Most recent BP at around 3:30 PM was 108/84.   The patient and her daughter were present for the interview.  The patient said that she had some pain that started on Christmas Day.  Since that time she has also had worsening shortness of breath and a 10 pound weight gain.  Patient reported that she is usually asymptomatic with her atrial fibrillation.  Also stated that she missed a dose of her Eliquis  last night.  Rarely drinks alcohol  less than once a week.  Has been diagnosed with sleep apnea but has been unable to tolerate the CPAP machine in the past.  Denies any lower extremity edema, melena, hematuria, hematochezia, orthopnea or PND.  She has been checking her blood pressure recently but has not been checking her heart rate.  Labs showed elevated proBNP of 8627, elevated high-sensitivity troponins of 46, albumin  of 3.5, creatinine of 1.02, BUN of 37, elevated potassium of 5.5, elevated WBC count of 13, anemia with a hemoglobin of 11.6  EKG showed atrial fibrillation with a ventricular rate of 159.  Chest x-ray showed no acute abnormalities.   Past Medical History:  Diagnosis Date   AAA (abdominal aortic aneurysm)    Acute renal failure superimposed on stage 3b chronic kidney disease (HCC) 06/15/2021   Anemia    h/o hemorrhoidal bleeding and blood transfusion   Aortic stenosis    Chronic diastolic CHF (congestive heart failure) (HCC)    Depression    Diverticulosis    DOE (dyspnea on exertion)    Esophageal reflux  GERD (gastroesophageal reflux disease)    Hypothyroidism    Insomnia    LBP (low back pain)    OAB (overactive bladder)    Obesity    OSA (obstructive sleep apnea)    Osteoarthritis    Paroxysmal atrial fibrillation (HCC)    Rheumatoid arthritis(714.0)    Shoulder pain, bilateral    Unspecified essential hypertension    Venous insufficiency     Past Surgical History:  Procedure Laterality Date   ABDOMINAL AORTIC ENDOVASCULAR STENT  GRAFT N/A 12/20/2019   Procedure: Aortogram including catheter selection of aorta and bilateral iliac arteriogram, Endovascular repair of infrarenal abdominal aortic aneurysm with bifurcated stent graft (26 mm x 14 x 12 main body, right bell bottom with a 20 mm x 10 cm piece, and left bell bottom with a 16 mm x 12 cm piece) ;  Surgeon: Gretta Lonni PARAS, MD;  Location: [facility] OR;  Service: Vascular;  Laterality: N/A;   APPENDECTOMY  1953   BIOPSY  06/18/2021   Procedure: BIOPSY;  Surgeon: Saintclair Jasper, MD;  Location: [facility] ENDOSCOPY;  Service: Gastroenterology;;   bladder abduction-1996  1996   breast biopsy Right 1980   CARDIOVERSION N/A 12/24/2019   Procedure: CARDIOVERSION;  Surgeon: Ladona Heinz, MD;  Location: [facility] ENDOSCOPY;  Service: Cardiovascular;  Laterality: N/A;   CARDIOVERSION N/A 04/25/2022   Procedure: CARDIOVERSION;  Surgeon: Ladona Heinz, MD;  Location: [facility] ENDOSCOPY;  Service: Cardiovascular;  Laterality: N/A;   CESAREAN SECTION     1957, 1961, 1964   COSMETIC SURGERY  1996   CYSTOCELE REPAIR     ESOPHAGOGASTRODUODENOSCOPY (EGD) WITH PROPOFOL  N/A 06/18/2021   Procedure: ESOPHAGOGASTRODUODENOSCOPY (EGD) WITH PROPOFOL ;  Surgeon: Saintclair Jasper, MD;  Location: [facility] ENDOSCOPY;  Service: Gastroenterology;  Laterality: N/A;   FLEXIBLE SIGMOIDOSCOPY N/A 04/10/2015   Procedure: FLEXIBLE SIGMOIDOSCOPY;  Surgeon: Elsie Cree, MD;  Location: [facility] ENDOSCOPY;  Service: Endoscopy;  Laterality: N/A;   INTRAMEDULLARY (IM) NAIL INTERTROCHANTERIC Left 02/03/2023   Procedure: LEFT INTRAMEDULLARY (IM) NAIL INTERTROCHANTERIC;  Surgeon: Vernetta Lonni GRADE, MD;  Location: [facility] OR;  Service: Orthopedics;  Laterality: Left;   IR AORTAGRAM ABDOMINAL SERIALOGRAM  10/26/2022   IR EMBO ARTERIAL NOT HEMORR HEMANG INC GUIDE ROADMAPPING  10/24/2022   IR RADIOLOGIST EVAL & MGMT  09/13/2022   IR RADIOLOGIST EVAL & MGMT  11/24/2022   IR US  GUIDE VASC ACCESS LEFT  10/26/2022   IR US  GUIDE VASC ACCESS LEFT  10/26/2022   IR US  GUIDE  VASC ACCESS LEFT  10/26/2022   IR US  GUIDE VASC ACCESS LEFT  10/26/2022   knee arthroscopy Right 1996, 2010   KNEE ARTHROSCOPY W/ AUTOGENOUS CARTILAGE IMPLANTATION (ACI) PROCEDURE Left 1994, 1995   REFRACTIVE SURGERY  01/2020   SHOULDER SURGERY  1990   TEE WITHOUT CARDIOVERSION N/A 12/24/2019   Procedure: TRANSESOPHAGEAL ECHOCARDIOGRAM (TEE);  Surgeon: Ladona Heinz, MD;  Location: [facility] ENDOSCOPY;  Service: Cardiovascular;  Laterality: N/A;   TEE WITHOUT CARDIOVERSION N/A 06/23/2022   Procedure: TRANSESOPHAGEAL ECHOCARDIOGRAM (TEE);  Surgeon: Dewane Shiner, DO;  Location: [facility] ENDOSCOPY;  Service: Cardiovascular;  Laterality: N/A;   TOTAL ABDOMINAL HYSTERECTOMY  1972   ULTRASOUND GUIDANCE FOR VASCULAR ACCESS Bilateral 12/20/2019   Procedure: Ultrasound-guided access of bilateral common femoral arteries for delivery of endograft and percutaneous closure;  Surgeon: Gretta Lonni PARAS, MD;  Location: [facility] OR;  Service: Vascular;  Laterality: Bilateral;   VESICOVAGINAL FISTULA CLOSURE W/ TAH     WRIST SURGERY  1967     Home Medications:  Prior to  Admission medications  Medication Sig Start Date End Date Taking? Authorizing Provider  ALEVE ARTHRITIS PAIN 1 % GEL Apply 2 g topically 4 (four) times daily as needed (for pain).    [provider]  amLODipine  (NORVASC ) 5 MG tablet Take 1 tablet (5 mg total) by mouth as needed. For systolic blood pressure >140 daily. 03/19/24 06/17/24  Ladona Heinz, MD  apixaban  (ELIQUIS ) 5 MG TABS tablet Take 1 tablet (5 mg total) by mouth 2 (two) times daily. 03/13/23   Ladona Heinz, MD  budesonide  (PULMICORT ) 0.5 MG/2ML nebulizer solution Take 2 mLs (0.5 mg total) by nebulization in the morning and at bedtime. Patient taking differently: Take 0.5 mg by nebulization daily. 03/22/22   Hope Almarie ORN, NP  busPIRone  (BUSPAR ) 7.5 MG tablet Take 7.5 mg by mouth See admin instructions. Take 7.5 mg by mouth at bedtime and an additional 7.5 mg once a day as needed for anxiety     [provider]  Cholecalciferol  (VITAMIN D3) 125 MCG (5000 UT) CAPS Take 1 capsule by mouth daily.    [provider]  dapagliflozin  propanediol (FARXIGA ) 10 MG TABS tablet Take 1 tablet (10 mg total) by mouth daily before breakfast. 12/01/23   Ladona Heinz, MD  diclofenac  sodium (VOLTAREN ) 1 % GEL Apply 2.25 g topically 3 (three) times daily as needed (for pain). 10/23/18   [provider]  furosemide  (LASIX ) 20 MG tablet Take 1 tablet (20 mg total) by mouth daily. 09/25/23   Ladona Heinz, MD  latanoprost  (XALATAN ) 0.005 % ophthalmic solution Place 1 drop into both eyes at bedtime.    [provider]  levothyroxine  (SYNTHROID ) 125 MCG tablet Take 125 mcg by mouth daily before breakfast.     [provider]  melatonin 3 MG TABS tablet Take 3 mg by mouth at bedtime.    [provider]  methocarbamol  (ROBAXIN ) 500 MG tablet Take 500 mg by mouth every 6 (six) hours as needed for muscle spasms. 06/26/23   [provider]  metoprolol  succinate (TOPROL -XL) 25 MG 24 hr tablet Take 1 tablet (25 mg total) by mouth daily. 01/17/24 03/19/24  Christobal Guadalajara, MD  Multiple Vitamins-Minerals (PRESERVISION AREDS 2 PO) Take 1 capsule by mouth in the morning and at bedtime.    [provider]  nortriptyline  (PAMELOR ) 25 MG capsule Take 25 mg by mouth at bedtime. 08/31/22   [provider]  polyethylene glycol (MIRALAX  / GLYCOLAX ) 17 g packet Take 17 g by mouth 2 (two) times daily. Decrease to daily dosing if developing diarrhea/watery stools Patient taking differently: Take 17 g by mouth daily as needed. Decrease to daily dosing if developing diarrhea/watery stools 02/07/23   Briana Elgin LABOR, MD  rOPINIRole  (REQUIP ) 1 MG tablet Take 1-2 mg by mouth See admin instructions. Take 2mg  by mouth at 1 PM and 1mg  at 9 PM Patient taking differently: Take 1 mg by mouth 3 (three) times daily. 06/19/15   [provider]  SYSTANE COMPLETE PF 0.6 % SOLN  Place 1 drop into both eyes 4 (four) times daily as needed (for dryness).    [provider]    Scheduled Meds:  furosemide   40 mg Intravenous Once   Continuous Infusions:  diltiazem  (CARDIZEM ) infusion Stopped (04/09/24 1345)   PRN Meds:   Allergies:   Allergies[1]  Social History:   Social History   Socioeconomic History   Marital status: Widowed    Spouse name: Not on file   Number of children: 3  Years of education: College   Highest education level: Bachelor's degree (e.g., BA, AB, BS)  Occupational History   Occupation: Retired  Tobacco Use   Smoking status: Former    Current packs/day: 0.00    Average packs/day: 0.2 packs/day for 10.0 years (2.0 ttl pk-yrs)    Types: Cigarettes    Start date: 04/11/1974    Quit date: 04/11/1984    Years since quitting: 40.0    Passive exposure: Past   Smokeless tobacco: Never  Vaping Use   Vaping status: Never Used  Substance and Sexual Activity   Alcohol  use: Yes    Alcohol /week: 0.0 standard drinks of alcohol     Comment: cocktail once a week   Drug use: No   Sexual activity: Not Currently  Other Topics Concern   Not on file  Social History Narrative   Widowed   Lives alone   3 children, 2 living   OCCUPATION: retired from dealer, production designer, theatre/television/film of physician's office   Went to [place] May 2016 for 11 days   Drinks 1 cup of coffee in the morning   Social Drivers of Health   Tobacco Use: Medium Risk (04/09/2024)   Patient History    Smoking Tobacco Use: Former    Smokeless Tobacco Use: Never    Passive Exposure: Past  Physicist, Medical Strain: Low Risk (08/11/2021)   Overall Financial Resource Strain (CARDIA)    Difficulty of Paying Living Expenses: Not hard at all  Food Insecurity: No Food Insecurity (01/12/2024)   Epic    Worried About Programme Researcher, Broadcasting/film/video in the Last Year: Never true    Ran Out of Food in the Last Year: Never true  Transportation Needs: No Transportation Needs (01/12/2024)   Epic    Lack  of Transportation (Medical): No    Lack of Transportation (Non-Medical): No  Physical Activity: Inactive (08/11/2021)   Exercise Vital Sign    Days of Exercise per Week: 0 days    Minutes of Exercise per Session: 0 min  Stress: No Stress Concern Present (08/11/2021)   [employer] of Occupational Health - Occupational Stress Questionnaire    Feeling of Stress : Only a little  Social Connections: Moderately Integrated (01/11/2024)   Social Connection and Isolation Panel    Frequency of Communication with Friends and Family: More than three times a week    Frequency of Social Gatherings with Friends and Family: More than three times a week    Attends Religious Services: More than 4 times per year    Active Member of [employer] or Organizations: Yes    Attends Banker Meetings: More than 4 times per year    Marital Status: Widowed  Intimate Partner Violence: Not At Risk (01/12/2024)   Epic    Fear of Current or Ex-Partner: No    Emotionally Abused: No    Physically Abused: No    Sexually Abused: No  Depression (PHQ2-9): Medium Risk (08/11/2021)   Depression (PHQ2-9)    PHQ-2 Score: 9  Alcohol  Screen: Low Risk (08/11/2021)   Alcohol  Screen    Last Alcohol  Screening Score (AUDIT): 1  Housing: Unknown (01/12/2024)   Epic    Unable to Pay for Housing in the Last Year: Not on file    Number of Times Moved in the Last Year: 0    Homeless in the Last Year: No  Utilities: Not At Risk (01/12/2024)   Epic    Threatened with loss of utilities: No  Health Literacy: Not  on file    Family History:    Family History  Problem Relation Age of Onset   Lung cancer Mother    COPD Father    Breast cancer Maternal Aunt    Heart attack Maternal Grandmother 65   Lung cancer Son      ROS:  Please see the history of present illness.   All other ROS reviewed and negative.     Physical Exam/Data: Vitals:   04/09/24 1300 04/09/24 1315 04/09/24 1330 04/09/24 1430  BP: 110/83 96/67 (!) 74/63  117/84  Pulse: (!) 29 61 (!) 135 (!) 47  Resp: (!) 35 20 18 19   Temp:      TempSrc:      SpO2: 96% 97% 96% 96%  Weight:      Height:       No intake or output data in the 24 hours ending 04/09/24 1516    04/09/2024   12:24 PM 03/19/2024    2:39 PM 01/31/2024    2:33 PM  Last 3 Weights  Weight (lbs) 193 lb 193 lb 193 lb  Weight (kg) 87.544 kg 87.544 kg 87.544 kg     Body mass index is 33.65 kg/m.  General:  Well nourished, well developed, in no acute distress.  Alert and orientated on room air. HEENT: normal Neck: no JVD. Vascular: No carotid bruits; Distal pulses 2+ bilaterally Cardiac:  normal S1, S2; irregularly irregular rhythm; no murmur. Lungs:  clear to auscultation bilaterally, no wheezing, rhonchi or rales  Abd: soft, nontender, and distended. Ext: no edema Musculoskeletal:  No deformities. Skin: warm and dry  Neuro:  no focal abnormalities noted Psych:  Normal affect   EKG:  The EKG was personally reviewed and demonstrates:  atrial fibrillation with a ventricular rate of 159. Telemetry:  Telemetry was personally reviewed and demonstrates: Atrial fibrillation with heart rates in the 120s to 150s.  Relevant CV Studies: TTE from prior hospitalization on 01/2024 IMPRESSIONS     1. Left ventricular ejection fraction, by estimation, is 65 to 70%. The  left ventricle has normal function. The left ventricle has no regional  wall motion abnormalities. Left ventricular diastolic parameters are  indeterminate.   2. Right ventricular systolic function is normal. The right ventricular  size is normal.   3. Left atrial size was severely dilated.   4. Mild mitral valve regurgitation. Moderate to severe mitral annular  calcification.   5. AV is thickened, calcified with restricted motion. Peak and mean  gradients through the valve are 60 and 30 mm hg respectively AVA (VTI) is  0.9 cm2 Dimensionless index is 0.31 OVerall consistent with moderate to  severe AS COmpared to  echo from 2024  mean gradient has increased (20 to 30 mm Hg). Aortic valve regurgitation  is mild.   6. The inferior vena cava is dilated in size with <50% respiratory  variability, suggesting right atrial pressure of 15 mmHg.   Laboratory Data: High Sensitivity Troponin:  No results for input(s): TROPONINIHS in the last 720 hours.  Recent Labs  Lab 04/09/24 1333  TRNPT 46*      Chemistry Recent Labs  Lab 04/09/24 1333  NA 133*  K 5.5*  CL 103  CO2 18*  GLUCOSE 104*  BUN 37*  CREATININE 1.02*  CALCIUM 8.6*  GFRNONAA 52*  ANIONGAP 12    Recent Labs  Lab 04/09/24 1333  PROT 7.3  ALBUMIN  3.5  AST 24  ALT 16  ALKPHOS 76  BILITOT 0.4  Lipids No results for input(s): CHOL, TRIG, HDL, LABVLDL, LDLCALC, CHOLHDL in the last 168 hours.  Hematology Recent Labs  Lab 04/09/24 1333  WBC 13.0*  RBC 4.17  HGB 11.6*  HCT 38.5  MCV 92.3  MCH 27.8  MCHC 30.1  RDW 14.7  PLT 321   Thyroid  No results for input(s): TSH, FREET4 in the last 168 hours.  BNP Recent Labs  Lab 04/09/24 1333  PROBNP 8,627.0*    DDimer No results for input(s): DDIMER in the last 168 hours.  Radiology/Studies:  DG Chest Port 1 View Result Date: 04/09/2024 EXAM: 1 VIEW(S) XRAY OF THE CHEST 04/09/2024 01:10:47 PM COMPARISON: 01/11/2024 CLINICAL HISTORY: Shortness of breath and palpitations. FINDINGS: LIMITATIONS/ARTIFACTS: Rotational artifact. LUNGS AND PLEURA: Similar chronic interstitial thickening. No focal pulmonary opacity. No pleural effusion. No pneumothorax. HEART AND MEDIASTINUM: Cardiomegaly. Aortic atherosclerosis. BONES AND SOFT TISSUES: Severe degenerative changes of bilateral shoulders. Remote bilateral rib fractures. Thoracic spondylosis. IMPRESSION: 1. No acute cardiopulmonary abnormality. 2. Cardiomegaly with similar chronic interstitial thickening. Electronically signed by: Waddell Calk MD 04/09/2024 02:29 PM EST RP Workstation: [id number]     Assessment and  Plan: Lisa Mcclure is a 89 y.o. female with a hx of hypertension, hypothyroidism, coronary artery calcifications, paroxysmal atrial fibrillation, CKD stage III, OSA not on CPAP, chronic diastolic heart failure,  AAA repair on 12/2019, AAA pseudo aneurysm sac repair on 10/2022, and moderate-severe aortic stenosis (previously declined TAVR and preferred medical management) who is being seen 04/09/2024 for the evaluation of atrial fibrillation with RVR at the request of Benton Shone MD.  Atrial fibrillation with RVR Had some chest pain and worsening shortness of breath that started on Christmas Day.  In the past patient has been asymptomatic with atrial fibrillation. EKG showed atrial fibrillation with a ventricular rate of 159. Labs showed potassium of 5.5.  Magnesium  and TSH will be collected tomorrow. Was started on IV Cardizem  for A-fib with RVR but this was stopped due to hypotension. Most recent BP at around 3:30 PM was 108/84. Reported that she missed a dose of her Eliquis  last night. Discussed with Dr. Lavona and he thinks that she may still be able to get a cardioversion Restart Eliquis  5 mg twice daily (correct dose given weight and creatinine) Start metoprolol  tartrate 12.5 mg every 6 hours. Plan for A cardioversion tomorrow. Informed Consent   Shared Decision Making/Informed Consent The risks (stroke, cardiac arrhythmias rarely resulting in the need for a temporary or permanent pacemaker, skin irritation or burns and complications associated with conscious sedation including aspiration, arrhythmia, respiratory failure and death), benefits (restoration of normal sinus rhythm) and alternatives of a direct current cardioversion were explained in detail to Lisa Mcclure and she agrees to proceed.        Moderate to severe aortic stenosis Previously discussed with Dr. Ladona and decided against getting a TAVR procedure performed. TEE on 01/2024 calcified aortic valve with a VTI of 0.9  and a DI of 0.31, and mild aortic regurgitation.   Diastolic heart failure Hyperkalemia TTE on 01/2024 showed a normal LVEF of 65 to 70%, no RWMA, and severe left atrial dilation. Labs showed potassium of 5.5, creatinine of 1.02, elevated proBNP of 8627 Patient reported that she has had a 10 pound weight gain.  Does not appear to be significantly volume up on exam. Chest x-ray showed no acute abnormalities. Received 1 dose of 40 mg IV Lasix .  This will also help hyperkalemia. GDMT Hold Aldactone  and farxiga  due to hyperkalemia.  Plan to restart prior to discharge   Hypertension Hold home amlodipine .  Will prioritize rate control for now.   OSA Has been diagnosed with sleep apnea but has been unable to tolerate the CPAP machine in the past.     Otherwise management per primary  Risk Assessment/Risk Scores:    New York  Heart Association (NYHA) Functional Class NYHA Class III  CHA2DS2-VASc Score = 6   This indicates a 9.7% annual risk of stroke. The patient's score is based upon: CHF History: 1 HTN History: 1 Diabetes History: 0 Stroke History: 0 Vascular Disease History: 1 Age Score: 2 Gender Score: 1    For questions or updates, please contact [facility] Please consult [email or web address] for contact info under   Signed, Morse Clause, PA-C  04/09/2024 3:16 PM  History and all data above reviewed.  I personally took the history today, performed the physical exam and formulated the assessment and plan.  The patient presents with SOB.  I reviewed all relevant tests and studies.  She lives alone and has had a tough time since a femur fracture earlier this year. She can get up with a walker but ambulates minimally.  She presents says that her SOB started on Christmas.  She was getting dyspneic with activity such as getting dressed.  She was not having chest pain and did not describe any palpitations.  She checks her HR with a pulse ox but has not done this recently.   She was surprised to find out that she was in atrial fib because she does not feel this.  She has had no PND or orthopnea.  She has had a chronic cough but no fevers or chills.  She denies edema and is surprised to find out that she has gained weight since the last weighing.   Patient examined.  I agree with the findings as above.  The patient exam reveals RNM:Pmmzhlojm, systolic murmur  ,  Lungs: Bilateral basilar crackles  ,  Abd: Positive bowel sounds, no rebound no guarding, Ext No edema  .    All available labs, radiology testing, previous records reviewed. Agree with documented assessment and plan.   Atrial fib:  She has a history of this.  She has been on DOAC although she missed one dose accidentally last night.  I suspect atrial fib was the reason for the acute shortness of breath with exacerbation of chronic diastolic HF in the face of AS.  She will need DCCV tomorrow and we will put her on the schedule for this.  Acute on chronic diastolic dysfunction:  She has SOB and basilar crackles but is not in distress.  She did receive a dose of Lasix  IV in the ED and is putting out urine. We can reassess in the AM to assess for further need for IV diureses vs resuming PO meds.  Continue out patient meds otherwise as listed.   AS:  TAVR was considered but she and Dr. Ladona have agreed on more conservative therapy with medical management.      Lynwood Schilling  6:16 PM  04/09/2024     [1]  Allergies Allergen Reactions   Gabapentin Other (See Comments)    Change in mental status    Methocarbamol  Other (See Comments)    Change mental status    Statins Other (See Comments)    Muscle aches and INTERNAL BLEEDING   "

## 2024-04-09 NOTE — ED Triage Notes (Signed)
 Lisa Mcclure ems from home after having SOB and CP for last few days.  EMS found Lisa in afib rvr in 160.  Lisa A&o.

## 2024-04-09 NOTE — H&P (Signed)
 " [facility] History and Physical  Lisa Mcclure FMW:[id number] DOB: April 16, 1932 DOA: 04/09/2024 PCP: Yolande Toribio MATSU, MD  Presented from: Home Chief Complaint: Close to worsening shortness of  History of Present Illness: Lisa Mcclure is a 89 y.o. female with PMH significant for HTN, HLD, OSA not on CPAP, CAD, paroxysmal A-fib s/p prior DCCV, moderate to severe aortic stenosis, chronic diastolic CHF, AAA repair 2021, AAA pseudoaneurysm sac repair 2024. Most recently hospitalized in October 2025 for sepsis secondary to viral pneumonia.  Echo at that time showed EF of 65 to 70%.  Patient presented to the ED today with complaint of chest pain and shortness of breath. She had been feeling some chest pain since Christmas Day.  Chest pain eventually improved but she started having shortness of breath that worsened over time.  She gained about 10 pounds of weight.  She went to see her primary doctor today.  She was noted to have A-fib with RVR to 160s without any symptoms.  Subsequently sent to ED.  In the ED, she was tachycardic to 140s, blood pressure stable, breathing on room air, afebrile Initial labs showed sodium 133, potassium 5.5, bicarb 18, BUN/creatinine 37/1.02, proBNP elevated to more than 8600, troponin 46, WC count 13 Chest x-ray with cardiomegaly with interstitial thickening EKG showed A-fib at 159 bpm, QTc 466 ms  In the ED, she was given 1 dose of IV Lasix .   I attempted to start Cardizem , blood pressure dropped and hence they had to to stop it. EDP has consulted cardiology. Hospitalist service consulted for inpatient management  At the time of my evaluation, patient was laying down in bed.  Heart rate 150s and 160s.  Daughter at bedside. Making clear urine with IV Lasix  given earlier. Her legs were cold but not painful.  Not ischemic looking.  She states she always is cold legs because of peripheral neuropathy.  Mild bilateral pedal edema noted.   Review of  Systems:  All systems were reviewed and were negative unless otherwise mentioned in the HPI   Past medical history: Past Medical History:  Diagnosis Date   AAA (abdominal aortic aneurysm)    Acute renal failure superimposed on stage 3b chronic kidney disease (HCC) 06/15/2021   Anemia    h/o hemorrhoidal bleeding and blood transfusion   Aortic stenosis    Chronic diastolic CHF (congestive heart failure) (HCC)    Depression    Diverticulosis    DOE (dyspnea on exertion)    Esophageal reflux    GERD (gastroesophageal reflux disease)    Hypothyroidism    Insomnia    LBP (low back pain)    OAB (overactive bladder)    Obesity    OSA (obstructive sleep apnea)    Osteoarthritis    Paroxysmal atrial fibrillation (HCC)    Rheumatoid arthritis(714.0)    Shoulder pain, bilateral    Unspecified essential hypertension    Venous insufficiency     Past surgical history: Past Surgical History:  Procedure Laterality Date   ABDOMINAL AORTIC ENDOVASCULAR STENT GRAFT N/A 12/20/2019   Procedure: Aortogram including catheter selection of aorta and bilateral iliac arteriogram, Endovascular repair of infrarenal abdominal aortic aneurysm with bifurcated stent graft (26 mm x 14 x 12 main body, right bell bottom with a 20 mm x 10 cm piece, and left bell bottom with a 16 mm x 12 cm piece) ;  Surgeon: Gretta Lonni PARAS, MD;  Location: [facility] OR;  Service: Vascular;  Laterality: N/A;   APPENDECTOMY  1953   BIOPSY  06/18/2021   Procedure: BIOPSY;  Surgeon: Saintclair Jasper, MD;  Location: [facility] ENDOSCOPY;  Service: Gastroenterology;;   bladder abduction-1996  1996   breast biopsy Right 1980   CARDIOVERSION N/A 12/24/2019   Procedure: CARDIOVERSION;  Surgeon: Ladona Heinz, MD;  Location: [facility] ENDOSCOPY;  Service: Cardiovascular;  Laterality: N/A;   CARDIOVERSION N/A 04/25/2022   Procedure: CARDIOVERSION;  Surgeon: Ladona Heinz, MD;  Location: [facility] ENDOSCOPY;  Service: Cardiovascular;  Laterality: N/A;   CESAREAN SECTION      1957, 1961, 1964   COSMETIC SURGERY  1996   CYSTOCELE REPAIR     ESOPHAGOGASTRODUODENOSCOPY (EGD) WITH PROPOFOL  N/A 06/18/2021   Procedure: ESOPHAGOGASTRODUODENOSCOPY (EGD) WITH PROPOFOL ;  Surgeon: Saintclair Jasper, MD;  Location: [facility] ENDOSCOPY;  Service: Gastroenterology;  Laterality: N/A;   FLEXIBLE SIGMOIDOSCOPY N/A 04/10/2015   Procedure: FLEXIBLE SIGMOIDOSCOPY;  Surgeon: Elsie Cree, MD;  Location: [facility] ENDOSCOPY;  Service: Endoscopy;  Laterality: N/A;   INTRAMEDULLARY (IM) NAIL INTERTROCHANTERIC Left 02/03/2023   Procedure: LEFT INTRAMEDULLARY (IM) NAIL INTERTROCHANTERIC;  Surgeon: Vernetta Lonni GRADE, MD;  Location: [facility] OR;  Service: Orthopedics;  Laterality: Left;   IR AORTAGRAM ABDOMINAL SERIALOGRAM  10/26/2022   IR EMBO ARTERIAL NOT HEMORR HEMANG INC GUIDE ROADMAPPING  10/24/2022   IR RADIOLOGIST EVAL & MGMT  09/13/2022   IR RADIOLOGIST EVAL & MGMT  11/24/2022   IR US  GUIDE VASC ACCESS LEFT  10/26/2022   IR US  GUIDE VASC ACCESS LEFT  10/26/2022   IR US  GUIDE VASC ACCESS LEFT  10/26/2022   IR US  GUIDE VASC ACCESS LEFT  10/26/2022   knee arthroscopy Right 1996, 2010   KNEE ARTHROSCOPY W/ AUTOGENOUS CARTILAGE IMPLANTATION (ACI) PROCEDURE Left 1994, 1995   REFRACTIVE SURGERY  01/2020   SHOULDER SURGERY  1990   TEE WITHOUT CARDIOVERSION N/A 12/24/2019   Procedure: TRANSESOPHAGEAL ECHOCARDIOGRAM (TEE);  Surgeon: Ladona Heinz, MD;  Location: [facility] ENDOSCOPY;  Service: Cardiovascular;  Laterality: N/A;   TEE WITHOUT CARDIOVERSION N/A 06/23/2022   Procedure: TRANSESOPHAGEAL ECHOCARDIOGRAM (TEE);  Surgeon: Dewane Shiner, DO;  Location: [facility] ENDOSCOPY;  Service: Cardiovascular;  Laterality: N/A;   TOTAL ABDOMINAL HYSTERECTOMY  1972   ULTRASOUND GUIDANCE FOR VASCULAR ACCESS Bilateral 12/20/2019   Procedure: Ultrasound-guided access of bilateral common femoral arteries for delivery of endograft and percutaneous closure;  Surgeon: Gretta Lonni PARAS, MD;  Location: [facility] OR;  Service: Vascular;  Laterality:  Bilateral;   VESICOVAGINAL FISTULA CLOSURE W/ TAH     WRIST SURGERY  1967    Social History:  reports that she quit smoking about 40 years ago. Her smoking use included cigarettes. She started smoking about 50 years ago. She has a 2 pack-year smoking history. She has been exposed to tobacco smoke. She has never used smokeless tobacco. She reports current alcohol  use. She reports that she does not use drugs.  Allergies:  Allergies[1] Gabapentin, Methocarbamol , and Statins   Family history:  Family History  Problem Relation Age of Onset   Lung cancer Mother    COPD Father    Breast cancer Maternal Aunt    Heart attack Maternal Grandmother 30   Lung cancer Son      Physical Exam: Vitals:   04/09/24 1545 04/09/24 1600 04/09/24 1609 04/09/24 1700  BP: 117/86 (!) 116/99  (!) 136/114  Pulse: 67   (!) 29  Resp: (!) 21 19  (!) 21  Temp:   98 F (36.7 C)   TempSrc:   Oral   SpO2: 92%   97%  Weight:      Height:       Wt Readings from Last 3 Encounters:  04/09/24 87.5 kg  03/19/24 87.5 kg  01/31/24 87.5 kg   Body mass index is 33.65 kg/m.  General exam: Pleasant, elderly Caucasian female Skin: No rashes, lesions or ulcers. HEENT: Atraumatic, normocephalic, no obvious bleeding Lungs: Clear to auscultation bilaterally,  CVS: Rapid review GI/Abd: Soft, nontender, nondistended, bowel sound present,   CNS: Alert, awake, oriented x 3 Psychiatry: Mood appropriate. Extremities: Trace bilateral pedal edema, no calf tenderness, both lower extremities are cold right more than left.  Slightly bluish looking but  nontender.   ----------------------------------------------------------------------------------------------------------------------------------------- ----------------------------------------------------------------------------------------------------------------------------------------- -----------------------------------------------------------------------------------------------------------------------------------------  Assessment/Plan: Principal Problem:   Acute CHF (congestive heart failure) (HCC)  Acute acute exacerbation of CHF Presented with worsening shortness of breath for 5 days proBNP elevated over 8000.  Chest x-ray with cardiomegaly and congestion Most recent echo from October 2025 with EF 65 to 70%, moderate to severe AS, RV size and function normal Given 1 dose of IV Lasix  in the ED PTA meds-Lasix  20 mg daily, Aldactone  25 mg daily, Farxiga  10 mg daily, amlodipine  5 mg PRN Defer to cardiology for diuresis Continue to monitor for daily intake output, weight, blood pressure, BNP, renal function and electrolytes. Net IO Since Admission: No IO data has been entered for this period [04/09/24 1726] Recent Labs  Lab 04/09/24 1333  PROBNP 8,627.0*  BUN 37*  CREATININE 1.02*  NA 133*  K 5.5*   A-fib is RVR H/o A-fib s/p prior DCCV Was noted to be in RVR in 160s initially.  Not sure for how long she has been on RVR for. She reports she usually does not get symptoms with A-fib. PTA meds-I do not see any AV nodal blocking agent in her list of medicines. Cardizem  was tried earlier but blood pressure dropped. Defer to cardiology for further management Chronically anticoagulated with Eliquis .  moderate to severe aortic stenosis Per patient, she was previously planned for TAVR but just before the day she was scheduled for TAVR, she fell and broke her hip and hence the procedure was canceled   Elevated troponin  H/o CAD Troponin mildly elevated likely due to LV  strain  AAA repair 2021 AAA pseudoaneurysm sac repair 2024. HLD Continue Eliquis . Per chart, not on statin because of allergy and bleeding??  Hyperkalemia/hyponatremia Potassium elevated to 5.5.  Expect improvement with diuresis Sodium level running low mildly.  Secondary to CHF.  Continue to monitor  OSA  not able to tolerate CPAP  Anxiety Continue buspirone , Pamelor  nightly  Peripheral neuropathy Continue Requip    Mobility: Since hip surgery in October, patient is mostly wheelchair.  Lives alone PT Orders:   PT Follow up Rec:    Goals of care:   Code Status: Limited: Do not attempt resuscitation (DNR) -DNR-LIMITED -Do Not Intubate/DNI     DVT prophylaxis:   apixaban  (ELIQUIS ) tablet 5 mg   Antimicrobials: None Fluid: None Consultants: Cardiology Family Communication: Daughter at bedside  Status: Inpatient Level of care:  Progressive   Patient is from: Home Anticipated d/c to: Hopefully home ultimately  Diet:  Diet Order             Diet 2 gram sodium Room service appropriate? Yes; Fluid consistency: Thin  Diet effective now                    ------------------------------------------------------------------------------------- Severity of Illness: The appropriate patient status for this patient is INPATIENT. Inpatient status  is judged to be reasonable and necessary in order to provide the required intensity of service to ensure the patient's safety. The patient's presenting symptoms, physical exam findings, and initial radiographic and laboratory data in the context of their chronic comorbidities is felt to place them at high risk for further clinical deterioration. Furthermore, it is not anticipated that the patient will be medically stable for discharge from the hospital within 2 midnights of admission.   * I certify that at the point of admission it is my clinical judgment that the patient will require inpatient hospital care spanning beyond 2  midnights from the point of admission due to high intensity of service, high risk for further deterioration and high frequency of surveillance required.* -------------------------------------------------------------------------------------   Home Meds: Prior to Admission medications  Medication Sig Start Date End Date Taking? Authorizing Provider  ALEVE ARTHRITIS PAIN 1 % GEL Apply 2 g topically 4 (four) times daily as needed (for pain).    [provider]  amLODipine  (NORVASC ) 5 MG tablet Take 1 tablet (5 mg total) by mouth as needed. For systolic blood pressure >140 daily. 03/19/24 06/17/24  Ladona Heinz, MD  apixaban  (ELIQUIS ) 5 MG TABS tablet Take 1 tablet (5 mg total) by mouth 2 (two) times daily. 03/13/23   Ladona Heinz, MD  budesonide  (PULMICORT ) 0.5 MG/2ML nebulizer solution Take 2 mLs (0.5 mg total) by nebulization in the morning and at bedtime. Patient taking differently: Take 0.5 mg by nebulization daily. 03/22/22   Hope Almarie ORN, NP  busPIRone  (BUSPAR ) 7.5 MG tablet Take 7.5 mg by mouth See admin instructions. Take 7.5 mg by mouth at bedtime and an additional 7.5 mg once a day as needed for anxiety    [provider]  Cholecalciferol  (VITAMIN D3) 125 MCG (5000 UT) CAPS Take 1 capsule by mouth daily.    [provider]  dapagliflozin  propanediol (FARXIGA ) 10 MG TABS tablet Take 1 tablet (10 mg total) by mouth daily before breakfast. 12/01/23   Ladona Heinz, MD  diclofenac  sodium (VOLTAREN ) 1 % GEL Apply 2.25 g topically 3 (three) times daily as needed (for pain). 10/23/18   [provider]  furosemide  (LASIX ) 20 MG tablet Take 1 tablet (20 mg total) by mouth daily. 09/25/23   Ladona Heinz, MD  latanoprost  (XALATAN ) 0.005 % ophthalmic solution Place 1 drop into both eyes at bedtime.    [provider]  levothyroxine  (SYNTHROID ) 125 MCG tablet Take 125 mcg by mouth daily before breakfast.     [provider]  melatonin 3 MG TABS tablet Take 3 mg  by mouth at bedtime.    [provider]  methocarbamol  (ROBAXIN ) 500 MG tablet Take 500 mg by mouth every 6 (six) hours as needed for muscle spasms. 06/26/23   [provider]  metoprolol  succinate (TOPROL -XL) 25 MG 24 hr tablet Take 1 tablet (25 mg total) by mouth daily. 01/17/24 03/19/24  Christobal Guadalajara, MD  Multiple Vitamins-Minerals (PRESERVISION AREDS 2 PO) Take 1 capsule by mouth in the morning and at bedtime.    [provider]  nortriptyline  (PAMELOR ) 25 MG capsule Take 25 mg by mouth at bedtime. 08/31/22   [provider]  polyethylene glycol (MIRALAX  / GLYCOLAX ) 17 g packet Take 17 g by mouth 2 (two) times daily. Decrease to daily dosing if developing diarrhea/watery stools Patient taking differently: Take 17 g by mouth daily as needed. Decrease to daily dosing if developing diarrhea/watery stools 02/07/23   Briana Elgin LABOR, MD  rOPINIRole  (  REQUIP ) 1 MG tablet Take 1-2 mg by mouth See admin instructions. Take 2mg  by mouth at 1 PM and 1mg  at 9 PM Patient taking differently: Take 1 mg by mouth 3 (three) times daily. 06/19/15   [provider]  SYSTANE COMPLETE PF 0.6 % SOLN Place 1 drop into both eyes 4 (four) times daily as needed (for dryness).    [provider]    Labs on Admission:   CBC: Recent Labs  Lab 04/09/24 1333  WBC 13.0*  NEUTROABS 10.0*  HGB 11.6*  HCT 38.5  MCV 92.3  PLT 321    Basic Metabolic Panel: Recent Labs  Lab 04/09/24 1333  NA 133*  K 5.5*  CL 103  CO2 18*  GLUCOSE 104*  BUN 37*  CREATININE 1.02*  CALCIUM 8.6*    Liver Function Tests: Recent Labs  Lab 04/09/24 1333  AST 24  ALT 16  ALKPHOS 76  BILITOT 0.4  PROT 7.3  ALBUMIN  3.5   No results for input(s): LIPASE, AMYLASE in the last 168 hours. No results for input(s): AMMONIA in the last 168 hours.  Cardiac Enzymes: No results for input(s): CKTOTAL, CKMB, CKMBINDEX, TROPONINI in the last 168 hours.  BNP (last 3  results) Recent Labs    11/22/23 1829  BNP 155.7*    ProBNP (last 3 results) Recent Labs    01/11/24 1338 04/09/24 1333  PROBNP 1,619.0* 8,627.0*    CBG: No results for input(s): GLUCAP in the last 168 hours.  Lipase     Component Value Date/Time   LIPASE 15 01/11/2024 0834     Urinalysis    Component Value Date/Time   COLORURINE YELLOW 01/11/2024 1230   APPEARANCEUR CLEAR 01/11/2024 1230   LABSPEC 1.037 (H) 01/11/2024 1230   PHURINE 7.0 01/11/2024 1230   GLUCOSEU >=500 (A) 01/11/2024 1230   HGBUR NEGATIVE 01/11/2024 1230   BILIRUBINUR NEGATIVE 01/11/2024 1230   KETONESUR NEGATIVE 01/11/2024 1230   PROTEINUR NEGATIVE 01/11/2024 1230   UROBILINOGEN 0.2 01/06/2009 0808   NITRITE NEGATIVE 01/11/2024 1230   LEUKOCYTESUR NEGATIVE 01/11/2024 1230     Drugs of Abuse  No results found for: LABOPIA, COCAINSCRNUR, LABBENZ, AMPHETMU, THCU, LABBARB    Radiological Exams on Admission: DG Chest Port 1 View Result Date: 04/09/2024 EXAM: 1 VIEW(S) XRAY OF THE CHEST 04/09/2024 01:10:47 PM COMPARISON: 01/11/2024 CLINICAL HISTORY: Shortness of breath and palpitations. FINDINGS: LIMITATIONS/ARTIFACTS: Rotational artifact. LUNGS AND PLEURA: Similar chronic interstitial thickening. No focal pulmonary opacity. No pleural effusion. No pneumothorax. HEART AND MEDIASTINUM: Cardiomegaly. Aortic atherosclerosis. BONES AND SOFT TISSUES: Severe degenerative changes of bilateral shoulders. Remote bilateral rib fractures. Thoracic spondylosis. IMPRESSION: 1. No acute cardiopulmonary abnormality. 2. Cardiomegaly with similar chronic interstitial thickening. Electronically signed by: Waddell Calk MD 04/09/2024 02:29 PM EST RP Workstation: [id number]     Bonney Chapman Rota, MD [facility] 04/09/2024        [1]  Allergies Allergen Reactions   Gabapentin Other (See Comments)    Change in mental status    Methocarbamol  Other (See Comments)    Change mental status     Statins Other (See Comments)    Muscle aches and INTERNAL BLEEDING   "

## 2024-04-10 ENCOUNTER — Encounter (HOSPITAL_COMMUNITY): Payer: Self-pay | Admitting: Internal Medicine

## 2024-04-10 ENCOUNTER — Inpatient Hospital Stay (HOSPITAL_COMMUNITY): Admitting: Anesthesiology

## 2024-04-10 ENCOUNTER — Encounter (HOSPITAL_COMMUNITY)
Admission: EM | Disposition: A | Discharge status: Home Health | Payer: Self-pay | Source: Home / Self Care | Attending: Internal Medicine

## 2024-04-10 ENCOUNTER — Inpatient Hospital Stay (HOSPITAL_COMMUNITY)

## 2024-04-10 DIAGNOSIS — I5031 Acute diastolic (congestive) heart failure: Secondary | ICD-10-CM | POA: Diagnosis not present

## 2024-04-10 DIAGNOSIS — I361 Nonrheumatic tricuspid (valve) insufficiency: Secondary | ICD-10-CM | POA: Diagnosis not present

## 2024-04-10 DIAGNOSIS — I340 Nonrheumatic mitral (valve) insufficiency: Secondary | ICD-10-CM

## 2024-04-10 DIAGNOSIS — I110 Hypertensive heart disease with heart failure: Secondary | ICD-10-CM

## 2024-04-10 DIAGNOSIS — I509 Heart failure, unspecified: Secondary | ICD-10-CM

## 2024-04-10 DIAGNOSIS — G4730 Sleep apnea, unspecified: Secondary | ICD-10-CM

## 2024-04-10 DIAGNOSIS — I4891 Unspecified atrial fibrillation: Secondary | ICD-10-CM | POA: Diagnosis not present

## 2024-04-10 HISTORY — PX: TRANSESOPHAGEAL ECHOCARDIOGRAM (CATH LAB): EP1270

## 2024-04-10 HISTORY — PX: CARDIOVERSION: EP1203

## 2024-04-10 LAB — BASIC METABOLIC PANEL WITH GFR
Anion gap: 14 (ref 5–15)
BUN: 42 mg/dL — ABNORMAL HIGH (ref 8–23)
CO2: 23 mmol/L (ref 22–32)
Calcium: 8.8 mg/dL — ABNORMAL LOW (ref 8.9–10.3)
Chloride: 100 mmol/L (ref 98–111)
Creatinine, Ser: 1.34 mg/dL — ABNORMAL HIGH (ref 0.44–1.00)
GFR, Estimated: 38 mL/min — ABNORMAL LOW
Glucose, Bld: 125 mg/dL — ABNORMAL HIGH (ref 70–99)
Potassium: 5.7 mmol/L — ABNORMAL HIGH (ref 3.5–5.1)
Sodium: 136 mmol/L (ref 135–145)

## 2024-04-10 LAB — CBC
HCT: 41.4 % (ref 36.0–46.0)
Hemoglobin: 12.8 g/dL (ref 12.0–15.0)
MCH: 28.1 pg (ref 26.0–34.0)
MCHC: 30.9 g/dL (ref 30.0–36.0)
MCV: 90.8 fL (ref 80.0–100.0)
Platelets: 347 K/uL (ref 150–400)
RBC: 4.56 MIL/uL (ref 3.87–5.11)
RDW: 14.6 % (ref 11.5–15.5)
WBC: 13.4 K/uL — ABNORMAL HIGH (ref 4.0–10.5)
nRBC: 0 % (ref 0.0–0.2)

## 2024-04-10 LAB — TSH: TSH: 0.607 u[IU]/mL (ref 0.350–4.500)

## 2024-04-10 LAB — PRO BRAIN NATRIURETIC PEPTIDE: Pro Brain Natriuretic Peptide: 14561 pg/mL — ABNORMAL HIGH

## 2024-04-10 LAB — ECHO TEE

## 2024-04-10 LAB — PHOSPHORUS: Phosphorus: 4.4 mg/dL (ref 2.5–4.6)

## 2024-04-10 LAB — MRSA NEXT GEN BY PCR, NASAL: MRSA by PCR Next Gen: NOT DETECTED

## 2024-04-10 LAB — MAGNESIUM: Magnesium: 2.5 mg/dL — ABNORMAL HIGH (ref 1.7–2.4)

## 2024-04-10 MED ORDER — HYDROCODONE-ACETAMINOPHEN 5-325 MG PO TABS
1.0000 | ORAL_TABLET | Freq: Three times a day (TID) | ORAL | Status: AC | PRN
  Administered 2024-04-10: 1 via ORAL
  Filled 2024-04-10: qty 1

## 2024-04-10 MED ORDER — NORTRIPTYLINE HCL 25 MG PO CAPS
25.0000 mg | ORAL_CAPSULE | Freq: Every day | ORAL | Status: DC
  Administered 2024-04-10: 25 mg via ORAL
  Filled 2024-04-10 (×2): qty 1

## 2024-04-10 MED ORDER — FUROSEMIDE 10 MG/ML IJ SOLN
INTRAMUSCULAR | Status: AC
  Administered 2024-04-10: 40 mg via INTRAVENOUS
  Filled 2024-04-10: qty 4

## 2024-04-10 MED ORDER — LEVOTHYROXINE SODIUM 25 MCG PO TABS
125.0000 ug | ORAL_TABLET | Freq: Every day | ORAL | Status: DC
  Administered 2024-04-11: 125 ug via ORAL
  Filled 2024-04-10: qty 1

## 2024-04-10 MED ORDER — APIXABAN 5 MG PO TABS
ORAL_TABLET | ORAL | Status: AC
  Administered 2024-04-10: 5 mg via ORAL
  Filled 2024-04-10: qty 1

## 2024-04-10 MED ORDER — METHOCARBAMOL 500 MG PO TABS
500.0000 mg | ORAL_TABLET | Freq: Four times a day (QID) | ORAL | Status: DC | PRN
  Administered 2024-04-11 (×2): 500 mg via ORAL
  Filled 2024-04-10 (×2): qty 1

## 2024-04-10 MED ORDER — PHENYLEPHRINE HCL (PRESSORS) 10 MG/ML IV SOLN
INTRAVENOUS | Status: DC | PRN
  Administered 2024-04-10: 160 ug via INTRAVENOUS

## 2024-04-10 MED ORDER — LIDOCAINE 2% (20 MG/ML) 5 ML SYRINGE
INTRAMUSCULAR | Status: DC | PRN
  Administered 2024-04-10: 60 mg via INTRAVENOUS

## 2024-04-10 MED ORDER — DICLOFENAC SODIUM 1 % EX GEL: 2.0000 g | Freq: Three times a day (TID) | CUTANEOUS | Status: DC | PRN

## 2024-04-10 MED ORDER — SODIUM CHLORIDE 0.9 % IV SOLN: INTRAVENOUS | Status: DC

## 2024-04-10 MED ORDER — FUROSEMIDE 10 MG/ML IJ SOLN: 40.0000 mg | Freq: Once | INTRAMUSCULAR | Status: AC

## 2024-04-10 MED ORDER — HYDROCODONE-ACETAMINOPHEN 5-325 MG PO TABS
1.0000 | ORAL_TABLET | Freq: Once | ORAL | Status: AC | PRN
  Administered 2024-04-10: 1 via ORAL
  Filled 2024-04-10: qty 1

## 2024-04-10 MED ORDER — SODIUM CHLORIDE 0.9 % IV SOLN: INTRAVENOUS | Status: DC | PRN

## 2024-04-10 MED ORDER — PROPOFOL 10 MG/ML IV BOLUS
INTRAVENOUS | Status: DC | PRN
  Administered 2024-04-10: 40 ug/kg/min via INTRAVENOUS
  Administered 2024-04-10: 20 mg via INTRAVENOUS

## 2024-04-10 MED ORDER — SODIUM ZIRCONIUM CYCLOSILICATE 10 G PO PACK
10.0000 g | PACK | Freq: Once | ORAL | Status: AC
  Administered 2024-04-10: 10 g via ORAL
  Filled 2024-04-10: qty 1

## 2024-04-10 NOTE — Progress Notes (Signed)
" °   04/10/24 0602  Assess: MEWS Score  Temp (!) 97.4 F (36.3 C)  BP 127/76  MAP (mmHg) 87  Pulse Rate (!) 140  ECG Heart Rate (!) 140  Resp 14  Level of Consciousness Alert  SpO2 94 %  O2 Device Room Air  Assess: MEWS Score  MEWS Temp 0  MEWS Systolic 0  MEWS Pulse 3  MEWS RR 0  MEWS LOC 0  MEWS Score 3  MEWS Score Color Yellow  Assess: if the MEWS score is Yellow or Red  Were vital signs accurate and taken at a resting state? Yes  Does the patient meet 2 or more of the SIRS criteria? Yes  Does the patient have a confirmed or suspected source of infection? No  MEWS guidelines implemented  Yes, yellow  Treat  MEWS Interventions Considered administering scheduled or prn medications/treatments as ordered  Take Vital Signs  Increase Vital Sign Frequency  Yellow: Q2hr x1, continue Q4hrs until patient remains green for 12hrs  Escalate  MEWS: Escalate Yellow: Discuss with charge nurse and consider notifying provider and/or RRT  Notify: Charge Nurse/RN  Name of Charge Nurse/RN Notified Tanya RN  Assess: SIRS CRITERIA  SIRS Temperature  0  SIRS Respirations  0  SIRS Pulse 1  SIRS WBC 1  SIRS Score Sum  2   Patient scheduled for cardioversion this morning.  "

## 2024-04-10 NOTE — Transfer of Care (Signed)
 Immediate Anesthesia Transfer of Care Note  Patient: Lisa Mcclure  Procedure(s) Performed: CARDIOVERSION TRANSESOPHAGEAL ECHOCARDIOGRAM  Patient Location: PACU and Cath Lab  Anesthesia Type:MAC  Level of Consciousness: drowsy  Airway & Oxygen  Therapy: Patient Spontanous Breathing  Post-op Assessment: Report given to RN  Post vital signs: stable  Last Vitals:  Vitals Value Taken Time  BP 101/48 04/10/24 10:02  Temp 36.5 C 04/10/24 10:02  Pulse 64 04/10/24 10:02  Resp 21 04/10/24 10:03  SpO2 93 % 04/10/24 10:02  Vitals shown include unfiled device data.  Last Pain:  Vitals:   04/10/24 1002  TempSrc: Tympanic  PainSc: Asleep         Complications: No notable events documented.

## 2024-04-10 NOTE — Anesthesia Postprocedure Evaluation (Signed)
"   Anesthesia Post Note  Patient: Lisa Mcclure  Procedure(s) Performed: CARDIOVERSION TRANSESOPHAGEAL ECHOCARDIOGRAM     Patient location during evaluation: Cath Lab Anesthesia Type: MAC Level of consciousness: awake and alert, patient cooperative and oriented Pain management: pain level controlled Vital Signs Assessment: post-procedure vital signs reviewed and stable Respiratory status: nonlabored ventilation, spontaneous breathing, respiratory function stable and patient connected to nasal cannula oxygen  Cardiovascular status: blood pressure returned to baseline and stable Postop Assessment: no apparent nausea or vomiting Anesthetic complications: no   No notable events documented.  Last Vitals:  Vitals:   04/10/24 0845 04/10/24 1002  BP: 122/73 (!) 101/48  Pulse: (!) 174 64  Resp: (!) 21 14  Temp:  36.5 C  SpO2: 92% 93%    Last Pain:  Vitals:   04/10/24 1002  TempSrc: Tympanic  PainSc: Asleep                 Lisa Mcclure,Lisa Mcclure      "

## 2024-04-10 NOTE — Anesthesia Preprocedure Evaluation (Addendum)
"                                    Anesthesia Evaluation  Patient identified by MRN, date of birth, ID band Patient awake    Reviewed: Allergy & Precautions, NPO status , Patient's Chart, lab work & pertinent test results  History of Anesthesia Complications Negative for: history of anesthetic complications  Airway Mallampati: II  TM Distance: >3 FB Neck ROM: Full    Dental  (+) Caps, Dental Advisory Given, Missing   Pulmonary sleep apnea , former smoker   breath sounds clear to auscultation       Cardiovascular hypertension, Pt. on medications (-) angina + Peripheral Vascular Disease (AAA) and +CHF  + dysrhythmias Atrial Fibrillation + Valvular Problems/Murmurs (mod-severe) AS  Rhythm:Irregular Rate:Tachycardia  01/2024 ECHO: EF 65 to 70%.  1.The LV has normal function, no regional wall motion abnormalities.   2. RVF is normal. The right ventricular size is normal.   3. Left atrial size was severely dilated.   4. Mild mitral valve regurgitation. Moderate to severe mitral annular calcification.   5. AV is thickened, calcified with restricted motion. Peak and mean gradients through the valve are 60 and 30 mm hg respectively AVA (VTI) is 0.9 cm2 Dimensionless index is 0.31 OVerall consistent with moderate to severe AS COmpared to echo from 2024  mean gradient has increased (20 to 30 mm Hg). Aortic valve regurgitation  is mild.     Neuro/Psych   Anxiety Depression    negative neurological ROS     GI/Hepatic Neg liver ROS,GERD  Controlled,,  Endo/Other  Hypothyroidism    Renal/GU Renal InsufficiencyRenal disease (K+ 5.7)     Musculoskeletal  (+) Arthritis ,    Abdominal   Peds  Hematology Eliquis  Hb 12.8, plt 347k   Anesthesia Other Findings   Reproductive/Obstetrics                              Anesthesia Physical Anesthesia Plan  ASA: 3  Anesthesia Plan: MAC   Post-op Pain Management: Minimal or no pain anticipated    Induction:   PONV Risk Score and Plan: 2 and Treatment may vary due to age or medical condition  Airway Management Planned: Natural Airway and Nasal Cannula  Additional Equipment: None  Intra-op Plan:   Post-operative Plan:   Informed Consent: I have reviewed the patients History and Physical, chart, labs and discussed the procedure including the risks, benefits and alternatives for the proposed anesthesia with the patient or authorized representative who has indicated his/her understanding and acceptance.   Patient has DNR.  Discussed DNR with patient and Suspend DNR.   Dental advisory given  Plan Discussed with: CRNA and Surgeon  Anesthesia Plan Comments:          Anesthesia Quick Evaluation  "

## 2024-04-10 NOTE — Progress Notes (Signed)
 PT Cancellation Note  Patient Details Name: Lisa Mcclure MRN: [id number] DOB: Aug 25, 1932   Cancelled Treatment:    Reason Eval/Treat Not Completed: (P) Patient at procedure or test/unavailable Pt is off floor for cardioversion. PT will follow back afterward for Evaluation if appropriate.   Christerpher Clos B. Fleeta Lapidus PT, DPT Acute Rehabilitation Services Please use secure chat or  Call Office [phone]    Almarie KATHEE Fleeta [facility] 04/10/2024, 9:59 AM

## 2024-04-10 NOTE — H&P (View-Only) (Signed)
 "   Progress Note  Patient Name: Lisa Mcclure Date of Encounter: 04/10/2024  Primary Cardiologist:   Gordy Bergamo, MD   Subjective   She did not sleep at all last night.  She is SOB but lying flat with normal respirations.   Inpatient Medications    Scheduled Meds:  apixaban   5 mg Oral BID   busPIRone   7.5 mg Oral QHS   metoprolol  tartrate  12.5 mg Oral Q6H   rOPINIRole   1 mg Oral TID   Continuous Infusions:  diltiazem  (CARDIZEM ) infusion Stopped (04/09/24 1345)   PRN Meds: acetaminophen  **OR** acetaminophen , albuterol , bisacodyl , hydrALAZINE , polyethylene glycol   Vital Signs    Vitals:   04/10/24 0200 04/10/24 0400 04/10/24 0500 04/10/24 0602  BP: 108/88 (!) 88/76 (!) 117/96 127/76  Pulse: (!) 142 (!) 140 (!) 145 (!) 140  Resp: 19 (!) 22 (!) 32 14  Temp:  98.1 F (36.7 C)  (!) 97.4 F (36.3 C)  TempSrc:    Axillary  SpO2: 100% 99% 100% 94%  Weight:    90.3 kg  Height:        Intake/Output Summary (Last 24 hours) at 04/10/2024 0742 Last data filed at 04/10/2024 0041 Gross per 24 hour  Intake --  Output 800 ml  Net -800 ml   Filed Weights   04/09/24 1224 04/10/24 0602  Weight: 87.5 kg 90.3 kg    Telemetry    Atrial fib with RVR - Personally Reviewed  ECG    NA - Personally Reviewed  Physical Exam   GEN: No acute distress.   Neck: No  JVD Cardiac: Irregular RR, 2/6 apical systolic murmur, no diastolic murmurs, rubs, or gallops.  Respiratory:    Decreased breath sounds with basilar crackles GI: Soft, nontender, non-distended  MS:    Trace edema; No deformity. Neuro:  Nonfocal  Psych: Normal affect   Labs    Chemistry Recent Labs  Lab 04/09/24 1333 04/10/24 0355  NA 133* 136  K 5.5* 5.7*  CL 103 100  CO2 18* 23  GLUCOSE 104* 125*  BUN 37* 42*  CREATININE 1.02* 1.34*  CALCIUM 8.6* 8.8*  PROT 7.3  --   ALBUMIN  3.5  --   AST 24  --   ALT 16  --   ALKPHOS 76  --   BILITOT 0.4  --   GFRNONAA 52* 38*  ANIONGAP 12 14      Hematology Recent Labs  Lab 04/09/24 1333 04/10/24 0355  WBC 13.0* 13.4*  RBC 4.17 4.56  HGB 11.6* 12.8  HCT 38.5 41.4  MCV 92.3 90.8  MCH 27.8 28.1  MCHC 30.1 30.9  RDW 14.7 14.6  PLT 321 347    Cardiac EnzymesNo results for input(s): TROPONINI in the last 168 hours. No results for input(s): TROPIPOC in the last 168 hours.   BNP Recent Labs  Lab 04/09/24 1333 04/10/24 0355  PROBNP 8,627.0* 14,561.0*     DDimer No results for input(s): DDIMER in the last 168 hours.   Radiology    DG Chest Port 1 View Result Date: 04/09/2024 EXAM: 1 VIEW(S) XRAY OF THE CHEST 04/09/2024 01:10:47 PM COMPARISON: 01/11/2024 CLINICAL HISTORY: Shortness of breath and palpitations. FINDINGS: LIMITATIONS/ARTIFACTS: Rotational artifact. LUNGS AND PLEURA: Similar chronic interstitial thickening. No focal pulmonary opacity. No pleural effusion. No pneumothorax. HEART AND MEDIASTINUM: Cardiomegaly. Aortic atherosclerosis. BONES AND SOFT TISSUES: Severe degenerative changes of bilateral shoulders. Remote bilateral rib fractures. Thoracic spondylosis. IMPRESSION: 1. No acute cardiopulmonary abnormality. 2. Cardiomegaly  with similar chronic interstitial thickening. Electronically signed by: Waddell Calk MD 04/09/2024 02:29 PM EST RP Workstation: [id number]    Cardiac Studies   NA  Patient Profile     89 y.o. female with a hx of hypertension, hypothyroidism, coronary artery calcifications, paroxysmal atrial fibrillation s/p DCCV on 04/2022, CKD stage III, OSA not on CPAP, chronic diastolic heart failure,  AAA repair on 12/2019, AAA pseudo aneurysm sac repair on 10/2022 (6.5 cm on CT on 01/2024 followed by CT surgery), and moderate-severe aortic stenosis (previously declined TAVR and preferred medical management) who is being seen 04/09/2024 for the evaluation of atrial fibrillation with RVR at the request of Benton Shone MD.   Assessment & Plan    Atrial fib with rapid ventricular rate:  Plan  is for DCCV today.  She missed one dose of Eliquis  two nights ago.    Acute on chronic diastolic HF:  Intake and out put incomplete.   However there has been reduced output.  I am worried about low output ATN.  I will give a dose of IV Lasix  this morning.    CKD:  Creat is mildly elevated.  She did receive one dose of IV Lasix  in the ED.   Follow creat.    Hyperkalemia:  I will give a dose of Lokelma  this morning.   For questions or updates, please contact [facility] Please consult [email or web address] for contact info under Cardiology/STEMI.   Signed, Lynwood Schilling, MD  04/10/2024, 7:42 AM    "

## 2024-04-10 NOTE — TOC Initial Note (Signed)
 Transition of Care [employer]) - Initial/Assessment Note    Patient Details  Name: Lisa Mcclure MRN: [id number] Date of Birth: 02/22/1933  [facility] CM/SW Contact:    Lauraine FORBES Saa, LCSWA Phone Number: 04/10/2024, 3:17 PM  Clinical Narrative:                  3:18 PM Per chart review, patient resides at home alone. Patient has a PCP and insurance on file. CSW confirmed with [facility] that patient is active with him for [facility]. RNCM was made aware. As of 2024, patient has DME at home (RW, high chair, high toilet, grab bars, oxygen ). DME history appears to be with [facility] based on chart. Patient has SNF history with [facility] and [facility] from 2016 and 2024. Patient's preferred pharmacy's listed are [facility] [place] [place], [facility] [place], [facility] [place], and [facility] [address]. TOC will continue to follow.  Expected Discharge Plan: Home w Home Health Services Barriers to Discharge: Continued Medical Work up   Patient Goals and CMS Choice            Expected Discharge Plan and Services   Discharge Planning Services: CM Consult   Living arrangements for the past 2 months: Single Family Home                                      Prior Living Arrangements/Services Living arrangements for the past 2 months: Single Family Home Lives with:: Self Patient language and need for interpreter reviewed:: Yes        Need for Family Participation in Patient Care: No (Comment) Care giver support system in place?: Yes (comment) Current home services: DME, Home RN Criminal Activity/Legal Involvement Pertinent to Current Situation/Hospitalization: No - Comment as needed  Activities of Daily Living   ADL Screening (condition at time of admission) Independently performs ADLs?: No Does the patient have a NEW difficulty with bathing/dressing/toileting/self-feeding that is expected to last >3 days?: Yes (Initiates electronic notice to provider for  possible OT consult) Does the patient have a NEW difficulty with getting in/out of bed, walking, or climbing stairs that is expected to last >3 days?: Yes (Initiates electronic notice to provider for possible PT consult) Does the patient have a NEW difficulty with communication that is expected to last >3 days?: No Is the patient deaf or have difficulty hearing?: No Does the patient have difficulty seeing, even when wearing glasses/contacts?: No Does the patient have difficulty concentrating, remembering, or making decisions?: No  Permission Sought/Granted Permission sought to share information with : Family Supports, Oceanographer granted to share information with : No (Contact information on chart)  Share Information with NAME: [name]  Permission granted to share info w AGENCY: [facility]  Permission granted to share info w Relationship: Daughter  Permission granted to share info w Contact Information: [phone]  Emotional Assessment       Orientation: : Oriented to Self, Oriented to Place, Oriented to  Time, Oriented to Situation Alcohol  / Substance Use: Not Applicable Psych Involvement: No (comment)  Admission diagnosis:  Acute CHF (congestive heart failure) (HCC) [I50.9] Atrial fibrillation with RVR (HCC) [I48.91] Acute on chronic congestive heart failure, unspecified heart failure type [facility]) [I50.9] Patient Active Problem List   Diagnosis Date Noted   Acute CHF (congestive heart failure) (HCC) 04/09/2024   Sepsis with acute organ dysfunction without septic shock (HCC)  01/13/2024   Palliative care encounter 01/13/2024   Osteoporosis, postmenopausal 12/19/2023   Closed comminuted intertrochanteric fracture of proximal end of left femur (HCC) 02/02/2023   S/P AAA repair 02/02/2023   Left atrial thrombus 02/02/2023   Closed left hip fracture (HCC) 02/02/2023   DNR (do not resuscitate) 01/25/2023   CHF exacerbation (HCC) 11/29/2022   Acute  exacerbation of CHF (congestive heart failure) (HCC) 06/19/2022   Chronic diastolic CHF (congestive heart failure) (HCC) 04/25/2022   Chronic renal failure, stage 3b (HCC) 04/25/2022   Class 2 obesity 04/25/2022   Persistent atrial fibrillation (HCC) 04/22/2022   Chronic cough 03/28/2022   Acute hypoxemic respiratory failure (HCC) 06/15/2021   Paroxysmal atrial fibrillation (HCC) 06/15/2021   Acute renal failure with acute tubular necrosis superimposed on stage 3b chronic kidney disease (HCC) 06/15/2021   Generalized weakness 06/15/2021   Atrial fibrillation with RVR (HCC)    Long term (current) use of anticoagulants    Long term current use of antiarrhythmic drug    AAA (abdominal aortic aneurysm) without rupture 12/20/2019   AAA (abdominal aortic aneurysm) 12/20/2019   Intolerance of continuous positive airway pressure (CPAP) ventilation 11/26/2019   Severe hypoxemia 08/21/2019   Severe obstructive sleep apnea 07/29/2019   COPD (chronic obstructive pulmonary disease) (HCC) 07/29/2019   RLS (restless legs syndrome) 07/29/2019   Dehydration with hyponatremia 09/13/2018   Bradycardia 09/13/2018   Glaucoma 09/13/2018   OA (osteoarthritis) of shoulder 05/16/2018   Incomplete emptying of bladder 05/17/2017   Labial adhesions 05/17/2017   Mixed stress and urge urinary incontinence 05/17/2017   Urethral sphincter deficiency, intrinsic (ISD) 05/17/2017   Severe aortic stenosis 01/15/2016   Bilateral carotid bruits 05/11/2015   Hyponatremia 04/19/2015   Symptomatic anemia 04/19/2015   Panic attack 04/19/2015   Constipation 04/19/2015   Hyperkalemia 04/17/2015   Influenza A 04/16/2015   Proximal humerus fracture    Left patella fracture    Benign essential HTN    Esophageal reflux    Chronic heart failure with preserved ejection fraction (HFpEF) (HCC)    Hypothyroidism    Lower GI bleed 04/08/2015   Hematochezia    Rectal bleeding 04/03/2015   Patellar fracture 03/06/2015   Left  humeral fracture 11/04/2013   Rib fracture 11/04/2013   Hypothyroid 11/04/2013   Essential hypertension 11/04/2013   Right shoulder pain 11/04/2013   OSA (obstructive sleep apnea) 11/13/2008   BOOP (bronchiolitis obliterans with organizing pneumonia) (HCC) 08/25/2008   PCP:  Yolande Toribio MATSU, MD Pharmacy:   [facility] [place], [place] - [address] [place] [address] Phone: [phone] Fax: [phone]  [facility] - [place], [place] - [address] [place] [address] Phone: [phone] Fax: [phone]  [facility] - [place], [place] - [address] [place] [address] Phone: [phone] Fax: [phone]  [id number] [place], [place] - [address] AT [facility] [address] [facility] [address] [place] [address] Phone: [phone] Fax: [phone]     Social Drivers of Health (SDOH) Social History: SDOH Screenings   Food Insecurity: No Food Insecurity (04/10/2024)  Housing: Low Risk (04/10/2024)  Transportation Needs: No Transportation Needs (04/10/2024)  Utilities: Not At Risk (04/10/2024)  Alcohol  Screen: Low Risk (08/11/2021)  Depression (PHQ2-9): Medium Risk (08/11/2021)  Financial Resource Strain: Low Risk (08/11/2021)  Physical Activity: Inactive (08/11/2021)  Social Connections: Moderately Integrated (  04/10/2024)  Stress: No Stress Concern Present (08/11/2021)  Tobacco Use: Medium Risk (04/09/2024)   SDOH Interventions:     Readmission Risk Interventions    01/12/2024    4:39 PM 12/02/2022   10:45 AM 06/21/2022    4:57 PM  Readmission Risk Prevention Plan  Transportation Screening Complete Complete Complete  PCP or Specialist Appt within 3-5 Days Complete Complete Complete  HRI or Home Care Consult Complete Complete Complete  Social Work Consult for Recovery Care Planning/Counseling Complete Complete   Palliative Care Screening Not  Applicable Not Applicable Not Applicable  Medication Review Oceanographer) Complete Referral to Pharmacy Complete

## 2024-04-10 NOTE — Progress Notes (Signed)
 " PROGRESS NOTE  Lisa Mcclure  DOB: [birth year]  PCP: Yolande Toribio MATSU, MD FMW:[id number]  DOA: 04/09/2024  LOS: 1 day  Hospital Day: 2  Subjective: Patient was seen and examined this am. Was in RVR overnight.  Could not sleep. Underwent successful DCCV this morning At the time of my eval, patient was sitting up at the edge of the bed.  On normal sinus rhythm.  Felt much better.  Daughter at bedside.  Asked to resume her home meds including Norco. Afebrile, blood pressure fluctuating today 80s and 100s mostly over 100. Labs from this morning with potassium 5.7, BUN/creatinine elevated to 42/1.34, proBNP further elevated at 14,000.  Brief narrative: Lisa Mcclure is a 89 y.o. female with PMH significant for HTN, HLD, OSA not on CPAP, CAD, paroxysmal A-fib s/p prior DCCV, moderate to severe aortic stenosis, chronic diastolic CHF, AAA repair 2021, AAA pseudoaneurysm sac repair 2024, peripheral neuropathy Most recently hospitalized in October 2025 for sepsis secondary to viral pneumonia.  Echo at that time showed EF of 65 to 70%.  Patient presented to the ED today with complaint of chest pain and shortness of breath. She had been feeling some chest pain since Christmas Day.  Chest pain eventually improved but she started having shortness of breath that worsened over time.  She gained about 10 pounds of weight.  She went to see her primary doctor today.  She was noted to have A-fib with RVR to 160s without any symptoms.  Subsequently sent to ED.  In the ED, she was tachycardic to 140s, blood pressure stable, breathing on room air, afebrile Initial labs showed sodium 133, potassium 5.5, bicarb 18, BUN/creatinine 37/1.02, proBNP elevated to more than 8600, troponin 46, WC count 13 Chest x-ray with cardiomegaly with interstitial thickening EKG showed A-fib at 159 bpm, QTc 466 ms In the ED, she was given 1 dose of IV Lasix .   EDP attempted to start Cardizem , blood pressure dropped and  hence they had to to stop it. Admitted to [facility] Cardiology was consulted  Assessment/Plan: Acute acute exacerbation of CHF Presented with worsening shortness of breath for 5 days proBNP elevated over 8000.  Chest x-ray with cardiomegaly and congestion Most recent echo from October 2025 with EF 65 to 70%, moderate to severe AS, RV size and function normal Given 1 dose of IV Lasix  in the ED PTA meds-Lasix  20 mg daily, Aldactone  25 mg daily, Farxiga  10 mg daily, amlodipine  5 mg PRN Given 1 more dose of Lasix  IV 40 mg this morning.  Defer to cardiology for diuresis. Continue to monitor for daily intake output, weight, blood pressure, BNP, renal function and electrolytes. Net IO Since Admission: -700 mL [04/10/24 1249] Recent Labs  Lab 04/09/24 1333 04/10/24 0355  PROBNP 8,627.0* 14,561.0*  BUN 37* 42*  CREATININE 1.02* 1.34*  NA 133* 136  K 5.5* 5.7*  MG  --  2.5*   A-fib is RVR H/o A-fib s/p prior DCCV Was noted to be in RVR in 160s initially.  Not sure for how long she has been on RVR for. She reports she usually does not get symptoms with A-fib. Unclear if she was on any AV nodal blocking agent at home Cardizem  was tried earlier in the ED but blood pressure dropped. Underwent successful DCCV this morning. Chronically anticoagulated with Eliquis .  Moderate to severe aortic stenosis Per patient, she was previously planned for TAVR but just before the day she was scheduled for TAVR, she fell and broke  her hip and hence the procedure was canceled.   Elevated troponin  H/o CAD Troponin mildly elevated likely due to LV strain  AAA repair 2021 AAA pseudoaneurysm sac repair 2024. HLD Continue Eliquis . Per chart, not on statin because of allergy and bleeding??  Hyperkalemia/hyponatremia Potassium elevated to 5.7 today.  1 dose of Lokelma  ordered by cardiology this morning.  Continue to monitor. Sodium level improved to normal this morning  OSA  not able to tolerate  CPAP  Anxiety Continue buspirone , Pamelor  nightly  Peripheral neuropathy Continue Requip , Norco   Mobility: Since hip surgery in October, patient is mostly wheelchair.  Lives alone.  PT eval requested. PT Orders:   PT Follow up Rec:    Goals of care:   Code Status: Limited: Do not attempt resuscitation (DNR) -DNR-LIMITED -Do Not Intubate/DNI     DVT prophylaxis:   apixaban  (ELIQUIS ) tablet 5 mg   Antimicrobials: None Fluid: None Consultants: Cardiology Family Communication: Daughter at bedside  Status: Inpatient Level of care:  Progressive   Patient is from: Home Anticipated d/c to: Hopefully home in 1 to 2 days  Diet:  Diet Order             Diet 2 gram sodium Room service appropriate? Yes; Fluid consistency: Thin  Diet effective now                   Scheduled Meds:  apixaban   5 mg Oral BID   busPIRone   7.5 mg Oral QHS   [START ON 04/11/2024] levothyroxine   125 mcg Oral QAC breakfast   metoprolol  tartrate  12.5 mg Oral Q6H   nortriptyline   25 mg Oral QHS   rOPINIRole   1 mg Oral TID    PRN meds: acetaminophen  **OR** acetaminophen , albuterol , bisacodyl , diclofenac  Sodium, hydrALAZINE , methocarbamol , polyethylene glycol   Infusions:   diltiazem  (CARDIZEM ) infusion Stopped (04/09/24 1345)    Antimicrobials: Anti-infectives (From admission, onward)    None       Objective: Vitals:   04/10/24 1042 04/10/24 1233  BP: 112/61 134/81  Pulse: 66 75  Resp: 20   Temp: 97.7 F (36.5 C)   SpO2: 96%     Intake/Output Summary (Last 24 hours) at 04/10/2024 1249 Last data filed at 04/10/2024 0958 Gross per 24 hour  Intake 100 ml  Output 800 ml  Net -700 ml   Filed Weights   04/09/24 1224 04/10/24 0602  Weight: 87.5 kg 90.3 kg   Weight change:  Body mass index is 34.71 kg/m.   Physical Exam:  General exam: Pleasant, elderly Caucasian female Skin: No rashes, lesions or ulcers. HEENT: Atraumatic, normocephalic, no obvious bleeding Lungs:  Clear to auscultation bilaterally,  CVS: Normal sinus rhythm GI/Abd: Soft, nontender, nondistended, bowel sound present,   CNS: Alert, awake, oriented x 3 Psychiatry: Mood appropriate. Extremities: Trace bilateral pedal edema, no calf tenderness, both lower extremities are cold right more than left. Slightly bluish looking but nontender.  Data Review: I have personally reviewed the laboratory data and studies available.  F/u labs ordered. Unresulted Labs (From admission, onward)     Start     Ordered   04/11/24 0500  CBC with Differential/Platelet  Tomorrow morning,   R       Question:  Specimen collection method  Answer:  Lab=Lab collect   04/10/24 0823   04/11/24 0500  Basic metabolic panel with GFR  Tomorrow morning,   R       Question:  Specimen collection method  Answer:  Lab=Lab collect   04/10/24 9176            Signed, Chapman Rota, MD [facility] 04/10/2024   "

## 2024-04-10 NOTE — Interval H&P Note (Signed)
 History and Physical Interval Note:  04/10/2024 9:25 AM  Lisa Mcclure  has presented today for surgery, with the diagnosis of afib.  The various methods of treatment have been discussed with the patient and family. After consideration of risks, benefits and other options for treatment, the patient has consented to  Procedures: CARDIOVERSION (N/A) TRANSESOPHAGEAL ECHOCARDIOGRAM (N/A) and Cardioversion as a surgical intervention.  The patient's history has been reviewed, patient examined, no change in status, stable for surgery.  I have reviewed the patient's chart and labs.  Questions were answered to the patient's satisfaction.    WE have reviewed her blood thinners and that she may have missed more than the one dose that she remembers.  Reviewed with Dr. Kelly Mace- anesthesia risk is the same.  We have reviewed the risks and benefits of transesophageal echocardiogram.  We have reviewed reversal of her DNR and DNI in the peri-procedural state.  Patient is able to consent.  In addition, I have attempted to call her daughter.  Discussed with Dr. Lavona; there is a concern that her clinical state is worsening.  She is not in extremis at this time.  We will proceed with TEE/DCCV.   Aarian Griffie A Delman Goshorn

## 2024-04-10 NOTE — Evaluation (Signed)
 Physical Therapy Evaluation Patient Details Name: Lisa Mcclure MRN: [id number] DOB: [birth year] Today's Date: 04/10/2024  History of Present Illness  89 y.o. female presents to ED 12/30 with chest pain and SoB. Pt is tachycardic HR in 140s, Chest x-ray with cardiomegaly with interstitial thickening, EKG showed A-fib at 159 bpm, QTc 466 ms. S/p 12/31 cardioversion PMH significant for HTN, HLD, OSA not on CPAP, CAD, paroxysmal A-fib s/p prior DCCV, moderate to severe aortic stenosis, chronic diastolic CHF, AAA repair 2021, AAA pseudoaneurysm sac repair 2024.  Clinical Impression  PTA pt living alone with daughter 5 min away and a variety of HHAides who assist her with her ADLs, and iADLs. Pt uses power chair for mobilizing in her home, requires Rollator for mobility in her bathroom and for limited community mobility. Pt is currently limited in safe mobility by generalized weakness, and mild balance deficits. Pt is maxA for returning to supine at end of session, but pt reports she sleeps in a recliner at home. Otherwise pt is contact guard for safety with transfers and ambulation with RW. Pt would benefit from HHPT for endurance and balance training. PT will continue to follow acutely and refer to Mobility Specialist.          If plan is discharge home, recommend the following: A little help with walking and/or transfers;A little help with bathing/dressing/bathroom;Assistance with cooking/housework;Assist for transportation;Help with stairs or ramp for entrance   Can travel by private vehicle    Yes    Equipment Recommendations None recommended by PT     Functional Status Assessment Patient has had a recent decline in their functional status and demonstrates the ability to make significant improvements in function in a reasonable and predictable amount of time.     Precautions / Restrictions Precautions Precautions: Fall Restrictions Weight Bearing Restrictions Per Provider Order: No       Mobility  Bed Mobility Overal bed mobility: Needs Assistance Bed Mobility: Sit to Supine       Sit to supine: Max assist   General bed mobility comments: maxA for bringing LE back into bed, pt reports she sleeps in a recliner because it is easier than getting in and out of bed    Transfers Overall transfer level: Needs assistance Equipment used: Rolling walker (2 wheels) Transfers: Sit to/from Stand, Bed to chair/wheelchair/BSC Sit to Stand: Min assist   Step pivot transfers: Min assist       General transfer comment: pt with good power up min A for steadying in standing from elevated bed and BSC    Ambulation/Gait Ambulation/Gait assistance: Contact guard assist Gait Distance (Feet): 20 Feet Assistive device: Rolling walker (2 wheels) Gait Pattern/deviations: Step-through pattern, Decreased step length - right, Decreased step length - left, Shuffle, Trunk flexed Gait velocity: slowed Gait velocity interpretation: <1.31 ft/sec, indicative of household ambulator   General Gait Details: contact guard for safety with walking to door and back, vc for proximity to RW, upright posture and increasing width of her stance for improved stability        Balance Overall balance assessment: Needs assistance Sitting-balance support: Feet supported, No upper extremity supported Sitting balance-Leahy Scale: Fair     Standing balance support: Single extremity supported, Bilateral upper extremity supported, During functional activity Standing balance-Leahy Scale: Poor Standing balance comment: needs at least single UE support for balance in standing  Pertinent Vitals/Pain Pain Assessment Pain Assessment: No/denies pain    Home Living Family/patient expects to be discharged to:: Private residence Living Arrangements: Alone Available Help at Discharge: Personal care attendant;Other (Comment) (Aides present daily [phone] and  1700-2000 daily to assist with LB ADLs, bathing, & IADLs) Type of Home: House Home Access: Level entry       Home Layout: One level Home Equipment: Agricultural Consultant (2 wheels);Rollator (4 wheels);Cane - single point;Grab bars - tub/shower;Adaptive equipment;Toilet riser;Wheelchair - power Additional Comments: aides come 9-12am and 5-8pm 7 days/week to assist with meals, bathing, dressing    Prior Function Prior Level of Function : Needs assist             Mobility Comments: pt mainly uses power wheelchair in her home and Rollator for ambulation into bathroom  and for limited community distances ADLs Comments: pt reports she can get herself dressed but aides and family provide for all other ADLs and iADLs     Extremity/Trunk Assessment   Upper Extremity Assessment Upper Extremity Assessment: Generalized weakness (bilateral frozen shoulders)    Lower Extremity Assessment Lower Extremity Assessment: LLE deficits/detail;RLE deficits/detail RLE Deficits / Details: ROM WFL strength grossly 3+/5 RLE Sensation: history of peripheral neuropathy RLE Coordination: decreased fine motor LLE Deficits / Details: L femur fx 2 years ago, hip ROM slightly limited but WFL, strength grossly 3+/5 LLE Sensation: history of peripheral neuropathy LLE Coordination: decreased fine motor    Cervical / Trunk Assessment Cervical / Trunk Assessment: Other exceptions Cervical / Trunk Exceptions: pt has bilateral frozen shoulders  Communication   Communication Communication: No apparent difficulties    Cognition Arousal: Alert Behavior During Therapy: WFL for tasks assessed/performed   PT - Cognitive impairments: No apparent impairments                         Following commands: Intact       Cueing Cueing Techniques: Verbal cues, Gestural cues     General Comments General comments (skin integrity, edema, etc.): VSS on RA        Assessment/Plan    PT Assessment Patient needs  continued PT services  PT Problem List Decreased strength;Decreased mobility;Decreased balance;Decreased activity tolerance       PT Treatment Interventions DME instruction;Gait training;Functional mobility training;Therapeutic activities;Therapeutic exercise;Balance training;Cognitive remediation;Patient/family education    PT Goals (Current goals can be found in the Care Plan section)  Acute Rehab PT Goals PT Goal Formulation: With patient/family Time For Goal Achievement: 04/18/24 Potential to Achieve Goals: Good    Frequency Min 3X/week        AM-PAC PT 6 Clicks Mobility  Outcome Measure Help needed turning from your back to your side while in a flat bed without using bedrails?: A Little Help needed moving from lying on your back to sitting on the side of a flat bed without using bedrails?: A Little Help needed moving to and from a bed to a chair (including a wheelchair)?: A Little Help needed standing up from a chair using your arms (e.g., wheelchair or bedside chair)?: A Little Help needed to walk in hospital room?: A Little Help needed climbing 3-5 steps with a railing? : A Lot 6 Click Score: 17    End of Session Equipment Utilized During Treatment: Gait belt Activity Tolerance: Patient tolerated treatment well Patient left: in bed;with call bell/phone within reach;with bed alarm set;with family/visitor present Nurse Communication: Mobility status PT Visit Diagnosis: Muscle weakness (generalized) (M62.81)  Time: 8690-8648 PT Time Calculation (min) (ACUTE ONLY): 42 min   Charges:   PT Evaluation $PT Eval Low Complexity: 1 Low PT Treatments $Gait Training: 8-22 mins $Therapeutic Activity: 8-22 mins PT General Charges $$ ACUTE PT VISIT: 1 Visit         Ledell Codrington B. Fleeta Lapidus PT, DPT Acute Rehabilitation Services Please use secure chat or  Call Office [phone]   Almarie KATHEE Fleeta Fleet 04/10/2024, 4:14 PM

## 2024-04-10 NOTE — Progress Notes (Signed)
 Heart Failure Navigator Progress Note  Assessed for Heart & Vascular TOC clinic readiness.  Patient does not meet criteria due to she has a scheduled [facility] appointment on 04/25/2024. No HF TOC at this time. .   Navigator will sign off at this time.   Stephane Haddock, BSN, Scientist, Clinical (histocompatibility And Immunogenetics) Only

## 2024-04-10 NOTE — Progress Notes (Signed)
 "   Progress Note  Patient Name: Lisa Mcclure Date of Encounter: 04/10/2024  Primary Cardiologist:   Gordy Bergamo, MD   Subjective   She did not sleep at all last night.  She is SOB but lying flat with normal respirations.   Inpatient Medications    Scheduled Meds:  apixaban   5 mg Oral BID   busPIRone   7.5 mg Oral QHS   metoprolol  tartrate  12.5 mg Oral Q6H   rOPINIRole   1 mg Oral TID   Continuous Infusions:  diltiazem  (CARDIZEM ) infusion Stopped (04/09/24 1345)   PRN Meds: acetaminophen  **OR** acetaminophen , albuterol , bisacodyl , hydrALAZINE , polyethylene glycol   Vital Signs    Vitals:   04/10/24 0200 04/10/24 0400 04/10/24 0500 04/10/24 0602  BP: 108/88 (!) 88/76 (!) 117/96 127/76  Pulse: (!) 142 (!) 140 (!) 145 (!) 140  Resp: 19 (!) 22 (!) 32 14  Temp:  98.1 F (36.7 C)  (!) 97.4 F (36.3 C)  TempSrc:    Axillary  SpO2: 100% 99% 100% 94%  Weight:    90.3 kg  Height:        Intake/Output Summary (Last 24 hours) at 04/10/2024 0742 Last data filed at 04/10/2024 0041 Gross per 24 hour  Intake --  Output 800 ml  Net -800 ml   Filed Weights   04/09/24 1224 04/10/24 0602  Weight: 87.5 kg 90.3 kg    Telemetry    Atrial fib with RVR - Personally Reviewed  ECG    NA - Personally Reviewed  Physical Exam   GEN: No acute distress.   Neck: No  JVD Cardiac: Irregular RR, 2/6 apical systolic murmur, no diastolic murmurs, rubs, or gallops.  Respiratory:    Decreased breath sounds with basilar crackles GI: Soft, nontender, non-distended  MS:    Trace edema; No deformity. Neuro:  Nonfocal  Psych: Normal affect   Labs    Chemistry Recent Labs  Lab 04/09/24 1333 04/10/24 0355  NA 133* 136  K 5.5* 5.7*  CL 103 100  CO2 18* 23  GLUCOSE 104* 125*  BUN 37* 42*  CREATININE 1.02* 1.34*  CALCIUM 8.6* 8.8*  PROT 7.3  --   ALBUMIN  3.5  --   AST 24  --   ALT 16  --   ALKPHOS 76  --   BILITOT 0.4  --   GFRNONAA 52* 38*  ANIONGAP 12 14      Hematology Recent Labs  Lab 04/09/24 1333 04/10/24 0355  WBC 13.0* 13.4*  RBC 4.17 4.56  HGB 11.6* 12.8  HCT 38.5 41.4  MCV 92.3 90.8  MCH 27.8 28.1  MCHC 30.1 30.9  RDW 14.7 14.6  PLT 321 347    Cardiac EnzymesNo results for input(s): TROPONINI in the last 168 hours. No results for input(s): TROPIPOC in the last 168 hours.   BNP Recent Labs  Lab 04/09/24 1333 04/10/24 0355  PROBNP 8,627.0* 14,561.0*     DDimer No results for input(s): DDIMER in the last 168 hours.   Radiology    DG Chest Port 1 View Result Date: 04/09/2024 EXAM: 1 VIEW(S) XRAY OF THE CHEST 04/09/2024 01:10:47 PM COMPARISON: 01/11/2024 CLINICAL HISTORY: Shortness of breath and palpitations. FINDINGS: LIMITATIONS/ARTIFACTS: Rotational artifact. LUNGS AND PLEURA: Similar chronic interstitial thickening. No focal pulmonary opacity. No pleural effusion. No pneumothorax. HEART AND MEDIASTINUM: Cardiomegaly. Aortic atherosclerosis. BONES AND SOFT TISSUES: Severe degenerative changes of bilateral shoulders. Remote bilateral rib fractures. Thoracic spondylosis. IMPRESSION: 1. No acute cardiopulmonary abnormality. 2. Cardiomegaly  with similar chronic interstitial thickening. Electronically signed by: Waddell Calk MD 04/09/2024 02:29 PM EST RP Workstation: [id number]    Cardiac Studies   NA  Patient Profile     89 y.o. female with a hx of hypertension, hypothyroidism, coronary artery calcifications, paroxysmal atrial fibrillation s/p DCCV on 04/2022, CKD stage III, OSA not on CPAP, chronic diastolic heart failure,  AAA repair on 12/2019, AAA pseudo aneurysm sac repair on 10/2022 (6.5 cm on CT on 01/2024 followed by CT surgery), and moderate-severe aortic stenosis (previously declined TAVR and preferred medical management) who is being seen 04/09/2024 for the evaluation of atrial fibrillation with RVR at the request of Benton Shone MD.   Assessment & Plan    Atrial fib with rapid ventricular rate:  Plan  is for DCCV today.  She missed one dose of Eliquis  two nights ago.    Acute on chronic diastolic HF:  Intake and out put incomplete.   However there has been reduced output.  I am worried about low output ATN.  I will give a dose of IV Lasix  this morning.    CKD:  Creat is mildly elevated.  She did receive one dose of IV Lasix  in the ED.   Follow creat.    Hyperkalemia:  I will give a dose of Lokelma  this morning.   For questions or updates, please contact [facility] Please consult [email or web address] for contact info under Cardiology/STEMI.   Signed, Lynwood Schilling, MD  04/10/2024, 7:42 AM    "

## 2024-04-10 NOTE — CV Procedure (Signed)
" ° °  TRANSESOPHAGEAL ECHOCARDIOGRAM GUIDED DIRECT CURRENT CARDIOVERSION  NAME:  Lisa Mcclure    MRN: [id number] DOB:  29-Dec-1932    ADMIT DATE: 04/09/2024  INDICATIONS: Symptomatic atrial fibrillation  PROCEDURE:   Informed consent was obtained prior to the procedure. The risks, benefits and alternatives for the procedure were discussed and the patient comprehended these risks.  Risks include, but are not limited to, cough, sore throat, vomiting, nausea, somnolence, esophageal and stomach trauma or perforation, bleeding, low blood pressure, aspiration, pneumonia, infection, trauma to the teeth and death.    After a procedural time-out, the oropharynx was anesthetized and the patient was sedated by the anesthesia service. The transesophageal probe was inserted in the esophagus and stomach without difficulty and multiple views were obtained. Anesthesia was monitored by Dr. Kelly Mace.   COMPLICATIONS:    Complications: No complications Patient tolerated procedure well.  KEY FINDINGS:  Patent left atrial appendage. Decrease LV function. Full study not performed due to worsening hypoxic respiratory failure. Full Report to follow.   CARDIOVERSION:     Indications:  Symptomatic Atrial Fibrillation  Procedure Details:  Once the TEE was complete, the patient had the defibrillator pads placed in the anterior and posterior position. Once an appropriate level of sedation was confirmed, the patient was cardioverted x 1 with 200J of biphasic synchronized energy.  The patient converted to NSR.  There were no apparent complications.  The patient had normal neuro status and respiratory status post procedure with vitals stable as recorded elsewhere.  Adequate airway was maintained throughout and vital signs monitored per protocol.  Conversion to sinus rhythm.  Stanly Leavens, MD [place]  [facility]  10:03 AM     "

## 2024-04-11 DIAGNOSIS — I5033 Acute on chronic diastolic (congestive) heart failure: Secondary | ICD-10-CM | POA: Diagnosis not present

## 2024-04-11 DIAGNOSIS — I440 Atrioventricular block, first degree: Secondary | ICD-10-CM | POA: Diagnosis not present

## 2024-04-11 DIAGNOSIS — I509 Heart failure, unspecified: Secondary | ICD-10-CM | POA: Diagnosis not present

## 2024-04-11 DIAGNOSIS — I480 Paroxysmal atrial fibrillation: Principal | ICD-10-CM

## 2024-04-11 LAB — BASIC METABOLIC PANEL WITH GFR
Anion gap: 14 (ref 5–15)
BUN: 46 mg/dL — ABNORMAL HIGH (ref 8–23)
CO2: 20 mmol/L — ABNORMAL LOW (ref 22–32)
Calcium: 8 mg/dL — ABNORMAL LOW (ref 8.9–10.3)
Chloride: 99 mmol/L (ref 98–111)
Creatinine, Ser: 1.21 mg/dL — ABNORMAL HIGH (ref 0.44–1.00)
GFR, Estimated: 43 mL/min — ABNORMAL LOW
Glucose, Bld: 108 mg/dL — ABNORMAL HIGH (ref 70–99)
Potassium: 4.6 mmol/L (ref 3.5–5.1)
Sodium: 133 mmol/L — ABNORMAL LOW (ref 135–145)

## 2024-04-11 LAB — CBC WITH DIFFERENTIAL/PLATELET
Abs Immature Granulocytes: 0.08 K/uL — ABNORMAL HIGH (ref 0.00–0.07)
Basophils Absolute: 0.1 K/uL (ref 0.0–0.1)
Basophils Relative: 0 %
Eosinophils Absolute: 0.5 K/uL (ref 0.0–0.5)
Eosinophils Relative: 4 %
HCT: 35.9 % — ABNORMAL LOW (ref 36.0–46.0)
Hemoglobin: 11.1 g/dL — ABNORMAL LOW (ref 12.0–15.0)
Immature Granulocytes: 1 %
Lymphocytes Relative: 10 %
Lymphs Abs: 1.3 K/uL (ref 0.7–4.0)
MCH: 28.1 pg (ref 26.0–34.0)
MCHC: 30.9 g/dL (ref 30.0–36.0)
MCV: 90.9 fL (ref 80.0–100.0)
Monocytes Absolute: 1.2 K/uL — ABNORMAL HIGH (ref 0.1–1.0)
Monocytes Relative: 9 %
Neutro Abs: 10.2 K/uL — ABNORMAL HIGH (ref 1.7–7.7)
Neutrophils Relative %: 76 %
Platelets: 264 K/uL (ref 150–400)
RBC: 3.95 MIL/uL (ref 3.87–5.11)
RDW: 14.6 % (ref 11.5–15.5)
WBC: 13.3 K/uL — ABNORMAL HIGH (ref 4.0–10.5)
nRBC: 0 % (ref 0.0–0.2)

## 2024-04-11 MED ORDER — METOPROLOL TARTRATE 25 MG PO TABS: 12.5000 mg | ORAL_TABLET | Freq: Four times a day (QID) | ORAL | 0 refills | Status: DC

## 2024-04-11 MED ORDER — FUROSEMIDE 20 MG PO TABS: 20.0000 mg | ORAL_TABLET | Freq: Every day | ORAL | 3 refills | Status: DC

## 2024-04-11 MED ORDER — HYDROCODONE-ACETAMINOPHEN 5-325 MG PO TABS
1.0000 | ORAL_TABLET | Freq: Three times a day (TID) | ORAL | Status: DC | PRN
  Administered 2024-04-11: 1 via ORAL
  Filled 2024-04-11: qty 1

## 2024-04-11 NOTE — Discharge Summary (Signed)
 "  Physician Discharge Summary  LELER BRION FMW:[id number] DOB: 07-21-1932 DOA: 04/09/2024  PCP: Yolande Toribio MATSU, MD  Admit date: 04/09/2024 Discharge date: 04/11/2024  Admitted from: home Discharge disposition: Home with home health  Recommendations at discharge:  Heart medicines: Metoprolol  tartrate 12.5 mg twice daily, Lasix  20 mg daily. Follow-up with cardiology as an outpatient  Subjective: Patient was seen and examined this am.  Sitting up in recliner.  Not in distress. Afebrile, remains in sinus rhythm overnight Hemodynamically stable, breathing on room air Ambulated with PT yesterday. Labs from this morning with potassium better at 4.6  Brief narrative: Lisa Mcclure is a 89 y.o. female with PMH significant for HTN, HLD, OSA not on CPAP, CAD, paroxysmal A-fib s/p prior DCCV, moderate to severe aortic stenosis, chronic diastolic CHF, AAA repair 2021, AAA pseudoaneurysm sac repair 2024, peripheral neuropathy Most recently hospitalized in October 2025 for sepsis secondary to viral pneumonia.  Echo at that time showed EF of 65 to 70%.  Patient presented to the ED today with complaint of chest pain and shortness of breath. She had been feeling some chest pain since Christmas Day.  Chest pain eventually improved but she started having shortness of breath that worsened over time.  She gained about 10 pounds of weight.  She went to see her primary doctor today.  She was noted to have A-fib with RVR to 160s without any symptoms.  Subsequently sent to ED.  In the ED, she was tachycardic to 140s, blood pressure stable, breathing on room air, afebrile Initial labs showed sodium 133, potassium 5.5, bicarb 18, BUN/creatinine 37/1.02, proBNP elevated to more than 8600, troponin 46, WC count 13 Chest x-ray with cardiomegaly with interstitial thickening EKG showed A-fib at 159 bpm, QTc 466 ms In the ED, she was given 1 dose of IV Lasix .   EDP attempted to start Cardizem , blood  pressure dropped and hence they had to to stop it. Admitted to [facility] Cardiology was consulted  Hospital course: Acute acute exacerbation of CHF Presented with worsening shortness of breath for 5 days proBNP was elevated over 8000.  Chest x-ray showed cardiomegaly and congestion Most recent echo from October 2025 with EF 65 to 70%, moderate to severe AS, RV size and function normal Cardiology was consulted. 12/31 TEE prior to DCCV showed EF low at 30 to 35%. Diuresed with IV Lasix , more than 1 L of negative balance so far. Blood pressure stable. Discharge on metoprolol  tartrate 12.5 mg twice daily, Lasix  20 mg daily. Continue to monitor for daily intake output, weight, blood pressure, BNP, renal function and electrolytes. Net IO Since Admission: -1,006.34 mL [04/11/24 1326] Recent Labs  Lab 04/09/24 1333 04/10/24 0355 04/11/24 0223  PROBNP 8,627.0* 14,561.0*  --   BUN 37* 42* 46*  CREATININE 1.02* 1.34* 1.21*  NA 133* 136 133*  K 5.5* 5.7* 4.6  MG  --  2.5*  --    A-fib is RVR S/p successful DCCV 12/31 Was noted to be in RVR in 160s initially.  Not sure for how long she has been on RVR for. She reports she usually does not get symptoms with A-fib. Cardizem  was tried earlier in the ED but blood pressure dropped. Underwent successful DCCV on 12/31.  For the last 24 hours since then, patient remains in normal sinus rhythm. Per cardiology note today, to continue metoprolol  tartrate 12.5 mg twice daily at home. Chronically anticoagulated with Eliquis .  Moderate to severe aortic stenosis Per patient, she was previously  planned for TAVR but just before the day she was scheduled for TAVR, she fell and broke her hip and hence the procedure was canceled.   Elevated troponin  H/o CAD Troponin mildly elevated likely due to LV strain.  AAA repair 2021 AAA pseudoaneurysm sac repair 2024. HLD Continue Eliquis . Per chart, not on statin because of allergy and  bleeding??  Hyperkalemia/hyponatremia Potassium level was elevated to 5.7 yesterday.  Improved today with Lokelma .   Sodium level remains on the lower side of normal because of CHF.  OSA  not able to tolerate CPAP  Anxiety Continue buspirone , Pamelor  nightly  Peripheral neuropathy Continue Requip , Norco PRN  Impaired mobility Since hip surgery in October, patient has mostly been using wheelchair at home.  Lives alone.  PT eval obtained.  Home with PT recommended.  Goals of care:   Code Status: Limited: Do not attempt resuscitation (DNR) -DNR-LIMITED -Do Not Intubate/DNI    Diet:  Diet Order             Diet general           Diet Heart Room service appropriate? Yes; Fluid consistency: Thin  Diet effective now                   Nutritional status:  Body mass index is 34.67 kg/m.       Wounds:  -    Discharge Medications:   Allergies as of 04/11/2024       Reactions   Gabapentin Other (See Comments)   Change in mental status    Methocarbamol  Other (See Comments)   Change mental status    Statins Other (See Comments)   Muscle aches and INTERNAL BLEEDING        Medication List     STOP taking these medications    amLODipine  5 MG tablet Commonly known as: NORVASC    dapagliflozin  propanediol 10 MG Tabs tablet Commonly known as: Farxiga    metoprolol  succinate 25 MG 24 hr tablet Commonly known as: TOPROL -XL   spironolactone  25 MG tablet Commonly known as: ALDACTONE        TAKE these medications    rOPINIRole  1 MG tablet Commonly known as: REQUIP  Take 1-2 mg by mouth See admin instructions. Take 2mg  by mouth at 1 PM and 1mg  at 9 PM What changed:  how much to take when to take this additional instructions The timing of this medication is very important.   apixaban  5 MG Tabs tablet Commonly known as: ELIQUIS  Take 1 tablet (5 mg total) by mouth 2 (two) times daily.   budesonide  0.5 MG/2ML nebulizer solution Commonly known as:  Pulmicort  Take 2 mLs (0.5 mg total) by nebulization in the morning and at bedtime.   busPIRone  7.5 MG tablet Commonly known as: BUSPAR  Take 7.5 mg by mouth See admin instructions. Take 7.5 mg by mouth at bedtime and an additional 7.5 mg once a day as needed for anxiety   Aleve Arthritis Pain 1 % Gel Generic drug: diclofenac  Sodium Apply 2 g topically 4 (four) times daily as needed (for pain).   diclofenac  sodium 1 % Gel Commonly known as: VOLTAREN  Apply 2.25 g topically 3 (three) times daily as needed (for pain).   furosemide  20 MG tablet Commonly known as: LASIX  Take 1 tablet (20 mg total) by mouth daily.   HYDROcodone -acetaminophen  10-325 MG tablet Commonly known as: NORCO Take 1 tablet by mouth every 6 (six) hours as needed for moderate pain (pain score 4-6).   latanoprost   0.005 % ophthalmic solution Commonly known as: XALATAN  Place 1 drop into both eyes at bedtime.   levothyroxine  125 MCG tablet Commonly known as: SYNTHROID  Take 125 mcg by mouth daily before breakfast.   methocarbamol  500 MG tablet Commonly known as: ROBAXIN  Take 500 mg by mouth every 6 (six) hours as needed for muscle spasms.   metoprolol  tartrate 25 MG tablet Commonly known as: LOPRESSOR  Take 0.5 tablets (12.5 mg total) by mouth every 6 (six) hours.   nortriptyline  25 MG capsule Commonly known as: PAMELOR  Take 25 mg by mouth at bedtime.   polyethylene glycol 17 g packet Commonly known as: MIRALAX  / GLYCOLAX  Take 17 g by mouth 2 (two) times daily. Decrease to daily dosing if developing diarrhea/watery stools What changed:  when to take this reasons to take this   PRESERVISION AREDS 2 PO Take 1 capsule by mouth in the morning and at bedtime.   Systane Complete PF 0.6 % Soln Generic drug: Propylene Glycol (PF) Place 1 drop into both eyes 4 (four) times daily as needed (for dryness).   Vitamin D3 125 MCG (5000 UT) Caps Take 1 capsule by mouth daily.         Follow ups:    Follow-up  Information     Yolande Toribio MATSU, MD Follow up.   Specialty: Internal Medicine Contact information: [address] [place] [address] [phone]         Ladona Heinz, MD Follow up on 04/25/2024.   Specialty: Cardiology Why: Keep scheduled follow-up for 04/25/2024 at 2:40 PM. Contact information: [address] [place] [address] [phone]                 Discharge Instructions:   Discharge Instructions     Call MD for:  difficulty breathing, headache or visual disturbances   Complete by: As directed    Call MD for:  extreme fatigue   Complete by: As directed    Call MD for:  hives   Complete by: As directed    Call MD for:  persistant dizziness or light-headedness   Complete by: As directed    Call MD for:  persistant nausea and vomiting   Complete by: As directed    Call MD for:  severe uncontrolled pain   Complete by: As directed    Call MD for:  temperature >100.4   Complete by: As directed    Diet general   Complete by: As directed    Discharge instructions   Complete by: As directed    Recommendations at discharge:   Heart medicines: Metoprolol  tartrate 12.5 mg twice daily, Lasix  20 mg daily.  Follow-up with cardiology as an outpatient  Discharge instructions for CHF Check weight daily -preferably same time every day. Restrict fluid intake to 1200 ml daily Restrict salt intake to less than 2 g daily. Call MD if you have one of the following symptoms 1) 3 pound weight gain in 24 hours or 5 pounds in 1 week  2) swelling in the hands, feet or stomach  3) progressive shortness of breath 4) if you have to sleep on extra pillows at night in order to breathe       General discharge instructions: Follow with Primary MD Yolande Toribio MATSU, MD in 7 days  Please request your PCP  to go over your hospital tests, procedures, radiology results at the follow up. Please get your medicines reviewed and adjusted.  Your PCP may decide to  repeat certain labs or  tests as needed. Do not drive, operate heavy machinery, perform activities at heights, swimming or participation in water  activities or provide baby sitting services if your were admitted for syncope or siezures until you have seen by Primary MD or a Neurologist and advised to do so again. [place]  Controlled Substance Reporting System database was reviewed. Do not drive, operate heavy machinery, perform activities at heights, swim, participate in water  activities or provide baby-sitting services while on medications for pain, sleep and mood until your outpatient physician has reevaluated you and advised to do so again.  You are strongly recommended to comply with the dose, frequency and duration of prescribed medications. Activity: As tolerated with Full fall precautions use walker/cane & assistance as needed Avoid using any recreational substances like cigarette, tobacco, alcohol , or non-prescribed drug. If you experience worsening of your admission symptoms, develop shortness of breath, life threatening emergency, suicidal or homicidal thoughts you must seek medical attention immediately by calling 911 or calling your MD immediately  if symptoms less severe. You must read complete instructions/literature along with all the possible adverse reactions/side effects for all the medicines you take and that have been prescribed to you. Take any new medicine only after you have completely understood and accepted all the possible adverse reactions/side effects.  Wear Seat belts while driving. You were cared for by a hospitalist during your hospital stay. If you have any questions about your discharge medications or the care you received while you were in the hospital after you are discharged, you can call the unit and ask to speak with the hospitalist or the covering physician. Once you are discharged, your primary care physician will handle any further medical issues. Please note  that NO REFILLS for any discharge medications will be authorized once you are discharged, as it is imperative that you return to your primary care physician (or establish a relationship with a primary care physician if you do not have one).   Increase activity slowly   Complete by: As directed        Discharge Exam:   Vitals:   04/11/24 0708 04/11/24 0720 04/11/24 0840 04/11/24 1207  BP:  (!) 114/48 114/67 (!) 116/51  Pulse:  65 63 66  Resp:  18 20 19   Temp:  97.8 F (36.6 C)  97.7 F (36.5 C)  TempSrc:  Oral  Oral  SpO2:  99% 100% 99%  Weight: 90.2 kg     Height:        Body mass index is 34.67 kg/m.  General exam: Pleasant, elderly Caucasian female Skin: No rashes, lesions or ulcers. HEENT: Atraumatic, normocephalic, no obvious bleeding Lungs: Clear to auscultation bilaterally,  CVS: Normal sinus rhythm GI/Abd: Soft, nontender, nondistended, bowel sound present,   CNS: Alert, awake, oriented x 3 Psychiatry: Mood appropriate. Extremities: Trace bilateral pedal edema, no calf tenderness,  The results of significant diagnostics from this hospitalization (including imaging, microbiology, ancillary and laboratory) are listed below for reference.    Procedures and Diagnostic Studies:   EP STUDY Result Date: 05/01/24 See surgical note for result.  ECHO TEE Result Date: 01-May-2024    TRANSESOPHOGEAL ECHO REPORT   Patient Name:   KOSHA JAQUITH Date of Exam: May 01, 2024 Medical Rec #:  [id number]         Height:       63.5 in Accession #:    [id number]        Weight:       199.1 lb Date of Birth:  Nov 26, 1932        BSA:          1.941 m Patient Age:    89 years          BP:           117/86 mmHg Patient Gender: F                 HR:           145 bpm. Exam Location:  Inpatient Procedure: TEE-Intraopertive, Color Doppler and Cardiac Doppler (Both Spectral            and Color Flow Doppler were utilized during procedure). Indications:     Cardioversion  History:          Patient has prior history of Echocardiogram examinations, most                  recent 01/12/2024.  Sonographer:     Tinnie Gosling RDCS Referring Phys:  [id number] [facility] A SANTO Diagnosing Phys: Stanly Santo MD PROCEDURE: After discussion of the risks and benefits of a TEE, an informed consent was obtained from the patient. The transesophogeal probe was passed without difficulty through the esophogus of the patient. Sedation performed by different physician. The patient was monitored while under deep sedation. Anesthestetic sedation was provided intravenously by Anesthesiology: 95.85mg  of Propofol , 60mg  of Lidocaine .  IMPRESSIONS  1. Left ventricular ejection fraction, by estimation, is 30 to 35%. The left ventricle has moderately decreased function.  2. Right ventricular systolic function is normal. The right ventricular size is mildly enlarged.  3. Left atrial size was moderately dilated. No left atrial/left atrial appendage thrombus was detected. The LAA emptying velocity was 19 cm/s.  4. Right atrial size was moderately dilated.  5. The mitral valve is normal in structure. Mild to moderate mitral valve regurgitation. No evidence of mitral stenosis.  6. Tricuspid valve regurgitation is mild to moderate.  7. The aortic valve is calcified. Aortic valve regurgitation is not visualized.  8. Limited TEE in the setting of acute on chronic hypoxic respiratory failure in the settingof worsening hypoxia during procedure. Resolved with removing probe and with successful cardioversion. FINDINGS  Left Ventricle: Left ventricular ejection fraction, by estimation, is 30 to 35%. The left ventricle has moderately decreased function. The left ventricular internal cavity size was normal in size. Right Ventricle: The right ventricular size is mildly enlarged. No increase in right ventricular wall thickness. Right ventricular systolic function is normal. Left Atrium: Left atrial size was moderately dilated. No left  atrial/left atrial appendage thrombus was detected. The LAA emptying velocity was 19 cm/s. Right Atrium: Right atrial size was moderately dilated. Pericardium: There is no evidence of pericardial effusion. Mitral Valve: The mitral valve is normal in structure. Mild to moderate mitral valve regurgitation. No evidence of mitral valve stenosis. Tricuspid Valve: The tricuspid valve is normal in structure. Tricuspid valve regurgitation is mild to moderate. No evidence of tricuspid stenosis. Aortic Valve: The aortic valve is calcified. Aortic valve regurgitation is not visualized. Pulmonic Valve: The pulmonic valve was not well visualized. Pulmonic valve regurgitation is not visualized. Aorta: The aortic root is normal in size and structure. IAS/Shunts: The interatrial septum appears to be lipomatous. No atrial level shunt detected by color flow Doppler. Stanly Santo MD Electronically signed by Stanly Santo MD Signature Date/Time: 04/10/2024/11:00:07 AM    Final    DG Chest Port 1 View Result Date: 04/09/2024 EXAM: 1 VIEW(S) XRAY OF THE CHEST 04/09/2024  01:10:47 PM COMPARISON: 01/11/2024 CLINICAL HISTORY: Shortness of breath and palpitations. FINDINGS: LIMITATIONS/ARTIFACTS: Rotational artifact. LUNGS AND PLEURA: Similar chronic interstitial thickening. No focal pulmonary opacity. No pleural effusion. No pneumothorax. HEART AND MEDIASTINUM: Cardiomegaly. Aortic atherosclerosis. BONES AND SOFT TISSUES: Severe degenerative changes of bilateral shoulders. Remote bilateral rib fractures. Thoracic spondylosis. IMPRESSION: 1. No acute cardiopulmonary abnormality. 2. Cardiomegaly with similar chronic interstitial thickening. Electronically signed by: Waddell Calk MD 04/09/2024 02:29 PM EST RP Workstation: [id number]     Labs:   Basic Metabolic Panel: Recent Labs  Lab 04/09/24 1333 04/10/24 0355 04/11/24 0223  NA 133* 136 133*  K 5.5* 5.7* 4.6  CL 103 100 99  CO2 18* 23 20*  GLUCOSE 104* 125*  108*  BUN 37* 42* 46*  CREATININE 1.02* 1.34* 1.21*  CALCIUM 8.6* 8.8* 8.0*  MG  --  2.5*  --   PHOS  --  4.4  --    GFR Estimated Creatinine Clearance: 33.9 mL/min (A) (by C-G formula based on SCr of 1.21 mg/dL (H)). Liver Function Tests: Recent Labs  Lab 04/09/24 1333  AST 24  ALT 16  ALKPHOS 76  BILITOT 0.4  PROT 7.3  ALBUMIN  3.5   No results for input(s): LIPASE, AMYLASE in the last 168 hours. No results for input(s): AMMONIA in the last 168 hours. Coagulation profile No results for input(s): INR, PROTIME in the last 168 hours.  CBC: Recent Labs  Lab 04/09/24 1333 04/10/24 0355 04/11/24 0223  WBC 13.0* 13.4* 13.3*  NEUTROABS 10.0*  --  10.2*  HGB 11.6* 12.8 11.1*  HCT 38.5 41.4 35.9*  MCV 92.3 90.8 90.9  PLT 321 347 264   Cardiac Enzymes: No results for input(s): CKTOTAL, CKMB, CKMBINDEX, TROPONINI in the last 168 hours. BNP: Invalid input(s): POCBNP CBG: No results for input(s): GLUCAP in the last 168 hours. D-Dimer No results for input(s): DDIMER in the last 72 hours. Hgb A1c No results for input(s): HGBA1C in the last 72 hours. Lipid Profile No results for input(s): CHOL, HDL, LDLCALC, TRIG, CHOLHDL, LDLDIRECT in the last 72 hours. Thyroid  function studies Recent Labs    04/10/24 0355  TSH 0.607   Anemia work up No results for input(s): VITAMINB12, FOLATE, FERRITIN, TIBC, IRON , RETICCTPCT in the last 72 hours. Microbiology Recent Results (from the past 240 hours)  MRSA Next Gen by PCR, Nasal     Status: None   Collection Time: 04/10/24  6:05 AM   Specimen: Nasal Mucosa; Nasal Swab  Result Value Ref Range Status   MRSA by PCR Next Gen NOT DETECTED NOT DETECTED Final    Comment: (NOTE) The GeneXpert MRSA Assay (FDA approved for NASAL specimens only), is one component of a comprehensive MRSA colonization surveillance program. It is not intended to diagnose MRSA infection nor to guide or monitor  treatment for MRSA infections. Test performance is not FDA approved in patients less than 79 years old. Performed at [facility], [address] [place], [place] [address]     Time coordinating discharge: 45 minutes  Signed: Cartina Brousseau  [facility] 04/11/2024, 1:26 PM   "

## 2024-04-11 NOTE — Progress Notes (Signed)
 "   Patient Name: Lisa Mcclure Date of Encounter: 04/11/2024 [facility] Cardiologist: Gordy Bergamo, MD  04/09/2024 .admit Length of stay: 2  Interval Summary  .    Lisa Mcclure is a 89 y.o. female patient with obesity, chronic diastolic CHF, hypertensive heart disease and chronic 3A-B kidney disease, paroxysmal atrial fibrillation, anemia secondary to hemorrhoids and diverticulosis, esophageal reflux, depression, OSA intolerant to CPAP on nocturnal 02, rheumatoid arthritis, and venous insufficiency, aortic and coronary calcification, s/p AAA repair on 12/20/2019. Patient underwent pseudoaneurysm sac repair of the AAA on 10/24/2022 and repair of the endoleak.  Per her wishes, further evaluation for any invasive procedures for aortic stenosis including TAVR was canceled, she also wanted to be DNR.  Now admitted with acute on chronic diastolic heart failure with A-fib with RVR and underwent successful direct-current cardioversion on 04/09/2024.   Dry weight 195 Lbs   Hospital course: She is presently doing well and in no acute distress.  No further dyspnea.  Wants to go home.  Physical Exam    Vitals:   04/11/24 0708 04/11/24 0720 04/11/24 0840 04/11/24 1207  BP:  (!) 114/48 114/67 (!) 116/51  Pulse:  65 63 66  Resp:  18 20 19   Temp:  97.8 F (36.6 C)  97.7 F (36.5 C)  TempSrc:  Oral  Oral  SpO2:  99% 100% 99%  Weight: 90.2 kg     Height:       Physical Exam Neck:     Vascular: No JVD.  Cardiovascular:     Rate and Rhythm: Normal rate and regular rhythm.     Pulses: Intact distal pulses.     Heart sounds: S1 normal and S2 normal. Murmur heard.     Early systolic murmur is present with a grade of 2/6 at the upper right sternal border.     No gallop.  Pulmonary:     Effort: Pulmonary effort is normal.     Breath sounds: Examination of the right-lower field reveals rhonchi. Rhonchi present.  Abdominal:     General: Bowel sounds are normal.     Palpations:  Abdomen is soft.  Musculoskeletal:     Right lower leg: No edema.     Left lower leg: No edema.        04/11/2024    7:08 AM 04/10/2024    6:02 AM 04/09/2024   12:24 PM  Last 3 Weights  Weight (lbs) 198 lb 13.7 oz 199 lb 1.2 oz 193 lb  Weight (kg) 90.2 kg 90.3 kg 87.544 kg      Labs   Lab Results  Component Value Date   NA 133 (L) 04/11/2024   K 4.6 04/11/2024   CO2 20 (L) 04/11/2024   GLUCOSE 108 (H) 04/11/2024   BUN 46 (H) 04/11/2024   CREATININE 1.21 (H) 04/11/2024   CALCIUM 8.0 (L) 04/11/2024   GFR 71.14 08/09/2010   EGFR 71 01/25/2023   GFRNONAA 43 (L) 04/11/2024       Latest Ref Rng & Units 04/11/2024    2:23 AM 04/10/2024    3:55 AM 04/09/2024    1:33 PM  BMP  Glucose 70 - 99 mg/dL 891  874  895   BUN 8 - 23 mg/dL 46  42  37   Creatinine 0.44 - 1.00 mg/dL 8.78  8.65  8.97   Sodium 135 - 145 mmol/L 133  136  133   Potassium 3.5 - 5.1 mmol/L 4.6  5.7  5.5   Chloride 98 - 111 mmol/L 99  100  103   CO2 22 - 32 mmol/L 20  23  18    Calcium 8.9 - 10.3 mg/dL 8.0  8.8  8.6        Latest Ref Rng & Units 04/11/2024    2:23 AM 04/10/2024    3:55 AM 04/09/2024    1:33 PM  CBC  WBC 4.0 - 10.5 K/uL 13.3  13.4  13.0   Hemoglobin 12.0 - 15.0 g/dL 88.8  87.1  88.3   Hematocrit 36.0 - 46.0 % 35.9  41.4  38.5   Platelets 150 - 400 K/uL 264  347  321     Lab Results  Component Value Date   CHOL 126 03/14/2019   HDL 49 03/14/2019   LDLCALC 61 03/14/2019   LDLDIRECT 159.2 12/19/2006   TRIG 82 03/14/2019   CHOLHDL 5.1 CALC 12/19/2006    Lab Results  Component Value Date   TSH 0.607 04/10/2024   Recent Labs    11/22/23 1829  BNP 155.7*    ProBNP (last 3 results) Recent Labs    01/11/24 1338 04/09/24 1333 04/10/24 0355  PROBNP 1,619.0* 8,627.0* 14,561.0*     Intake/Output Summary (Last 24 hours) at 04/11/2024 1247 Last data filed at 04/10/2024 2100 Gross per 24 hour  Intake 243.66 ml  Output 550 ml  Net -306.34 ml    Net IO Since Admission:  -1,006.34 mL [04/11/24 1247]  Tele/EKG/Cardiac studies    Telemetry: Sinus rhythm with first-degree AV block  Cardiac Studies & Procedures   ______________________________________________________________________________________________ ECHOCARDIOGRAM COMPLETE 01/12/2024 1. Left ventricular ejection fraction, by estimation, is 65 to 70%. The left ventricle has normal function. The left ventricle has no regional wall motion abnormalities. Left ventricular diastolic parameters are indeterminate. 2. Right ventricular systolic function is normal. The right ventricular size is normal. 3. Left atrial size was severely dilated. 4. Mild mitral valve regurgitation. Moderate to severe mitral annular calcification. 5. AV is thickened, calcified with restricted motion. Peak and mean gradients through the valve are 60 and 30 mm hg respectively AVA (VTI) is 0.9 cm2 Dimensionless index is 0.31 OVerall consistent with moderate to severe AS COmpared to echo from 2024 mean gradient has increased (20 to 30 mm Hg). Aortic valve regurgitation is mild. 6. The inferior vena cava is dilated in size with <50% respiratory variability, suggesting right atrial pressure of 15 mmHg.  Aortic Valve: AV is thickened, calcified with restricted motion. Peak and mean gradients through the valve are 60 and 30 mm hg respectively AVA (VTI) is 0.9 cm2 Dimensionless index is 0.31 OVerall consistent with moderate to severe AS COmpared to echo from 2024 mean gradient has increased (20 to 30 mm Hg). Aortic valve regurgitation is mild. Aortic regurgitation PHT measures 377 msec. Aortic valve mean gradient measures 30.0 mmHg. Aortic valve peak gradient measures 60.8 mmHg. Aortic valve area, by VTI measures 0.88 cm.  ECHO TEE 04/10/2024  1. Left ventricular ejection fraction, by estimation, is 30 to 35%. The left ventricle has moderately decreased function. 2. Right ventricular systolic function is normal. The right ventricular size is  mildly enlarged. 3. Left atrial size was moderately dilated. No left atrial/left atrial appendage thrombus was detected. The LAA emptying velocity was 19 cm/s. 4. Right atrial size was moderately dilated. 5. The mitral valve is normal in structure. Mild to moderate mitral valve regurgitation. No evidence of mitral stenosis. 6. Tricuspid valve regurgitation is mild to moderate. 7. The aortic  valve is calcified. Aortic valve regurgitation is not visualized. 8. Limited TEE in the setting of acute on chronic hypoxic respiratory failure in the settingof worsening hypoxia during procedure. Resolved with removing probe and with successful cardioversion.  Current Meds:    Current Medications[1]  Assessment & Plan .     1.  Acute on chronic diastolic heart failure precipitated by A-fib with RVR 2.  Paroxysmal atrial fibrillation SP direct-current cardioversion 04/10/2024, maintaining sinus rhythm. 3.  Moderate to severe aortic stenosis, symptomatic but medical therapy in view of advanced age and patient's preference.  Recommendations: Patient is doing well and is maintaining sinus rhythm.  She can be discharged home with outpatient follow-up.  Presently on metoprolol  to tartrate 25 mg 1/2 tablet twice daily, continue same.  She has had bradycardia with higher doses and has underlying first-degree AV block, hence would not consider amiodarone  for now unless she has recurrence of atrial fibrillation, we could consider either amiodarone  or dofetilide.  Continue Eliquis  long-term.  Over office will call her to set up follow-up appointments. For questions or updates, please contact [facility] Please consult [email or web address] for contact info under        Signed,   Gordy Bergamo, MD, [facility] 04/11/2024, 12:47 PM [facility] [address] [place], [place] [address] Phone: [phone]. Fax:  [phone]       [1]  Current Facility-Administered Medications:    0.9 %  sodium chloride   infusion, , Intravenous, Continuous, Chandrasekhar, Mahesh A, MD   acetaminophen  (TYLENOL ) tablet 650 mg, 650 mg, Oral, Q6H PRN, 650 mg at 04/11/24 0420 **OR** acetaminophen  (TYLENOL ) suppository 650 mg, 650 mg, Rectal, Q6H PRN, Dahal, Binaya, MD   albuterol  (PROVENTIL ) (2.5 MG/3ML) 0.083% nebulizer solution 2.5 mg, 2.5 mg, Nebulization, Q6H PRN, Dahal, Binaya, MD   apixaban  (ELIQUIS ) tablet 5 mg, 5 mg, Oral, BID, Adams, Zane, PA-C, 5 mg at 04/11/24 9160   bisacodyl  (DULCOLAX) EC tablet 5 mg, 5 mg, Oral, Daily PRN, Dahal, Chapman, MD   busPIRone  (BUSPAR ) tablet 7.5 mg, 7.5 mg, Oral, QHS, Dahal, Binaya, MD, 7.5 mg at 04/10/24 2055   diclofenac  Sodium (VOLTAREN ) 1 % topical gel 2 g, 2 g, Topical, TID PRN, Dahal, Binaya, MD   hydrALAZINE  (APRESOLINE ) injection 10 mg, 10 mg, Intravenous, Q6H PRN, Dahal, Binaya, MD   HYDROcodone -acetaminophen  (NORCO/VICODIN) 5-325 MG per tablet 1 tablet, 1 tablet, Oral, Q8H PRN, Dahal, Binaya, MD, 1 tablet at 04/11/24 0839   levothyroxine  (SYNTHROID ) tablet 125 mcg, 125 mcg, Oral, QAC breakfast, Dahal, Binaya, MD, 125 mcg at 04/11/24 0533   methocarbamol  (ROBAXIN ) tablet 500 mg, 500 mg, Oral, Q6H PRN, Dahal, Binaya, MD, 500 mg at 04/11/24 0534   metoprolol  tartrate (LOPRESSOR ) tablet 12.5 mg, 12.5 mg, Oral, Q6H, Adams, Zane, PA-C, 12.5 mg at 04/11/24 0849   nortriptyline  (PAMELOR ) capsule 25 mg, 25 mg, Oral, QHS, Dahal, Binaya, MD, 25 mg at 04/10/24 2057   polyethylene glycol (MIRALAX  / GLYCOLAX ) packet 17 g, 17 g, Oral, Daily PRN, Dahal, Binaya, MD   rOPINIRole  (REQUIP ) tablet 1 mg, 1 mg, Oral, TID, Dahal, Binaya, MD, 1 mg at 04/11/24 0839  "

## 2024-04-11 NOTE — TOC Transition Note (Signed)
 Transition of Care [facility]) - Discharge Note   Patient Details  Name: Lisa Mcclure MRN: [id number] Date of Birth: 1932/08/10  Transition of Care [facility]) CM/SW Contact:  Roxie KANDICE Stain, RN Phone Number: 04/11/2024, 1:33 PM   Clinical Narrative:     Patient stable for discharge.  Notified Cory with [facility] of discharge.  Patient to follow up with PCP.  Final next level of care: Home w Home Health Services Barriers to Discharge: Barriers Resolved   Patient Goals and CMS Choice Patient states their goals for this hospitalization and ongoing recovery are:: return home          Discharge Placement  home                     Discharge Plan and Services Additional resources added to the After Visit Summary for     Discharge Planning Services: CM Consult                      HH Arranged: PT, RN [facility] Agency: [facility] Date [facility] Agency Contacted: 04/11/24 Time HH Agency Contacted: 1333 Representative spoke with at [facility] Agency: Darleene  Social Drivers of Health (SDOH) Interventions SDOH Screenings   Food Insecurity: No Food Insecurity (04/10/2024)  Housing: Low Risk (04/10/2024)  Transportation Needs: No Transportation Needs (04/10/2024)  Utilities: Not At Risk (04/10/2024)  Alcohol  Screen: Low Risk (08/11/2021)  Depression (PHQ2-9): Medium Risk (08/11/2021)  Financial Resource Strain: Low Risk (08/11/2021)  Physical Activity: Inactive (08/11/2021)  Social Connections: Moderately Integrated (04/10/2024)  Stress: No Stress Concern Present (08/11/2021)  Tobacco Use: Medium Risk (04/09/2024)     Readmission Risk Interventions    01/12/2024    4:39 PM 12/02/2022   10:45 AM 06/21/2022    4:57 PM  Readmission Risk Prevention Plan  Transportation Screening Complete Complete Complete  PCP or Specialist Appt within 3-5 Days Complete Complete Complete  HRI or Home Care Consult Complete Complete Complete  Social Work Consult for Recovery Care Planning/Counseling  Complete Complete   Palliative Care Screening Not Applicable Not Applicable Not Applicable  Medication Review Oceanographer) Complete Referral to Pharmacy Complete

## 2024-04-11 NOTE — Plan of Care (Signed)
  Problem: Education: Goal: Knowledge of General Education information will improve Description: Including pain rating scale, medication(s)/side effects and non-pharmacologic comfort measures Outcome: Progressing   Problem: Health Behavior/Discharge Planning: Goal: Ability to manage health-related needs will improve Outcome: Progressing   Problem: Clinical Measurements: Goal: Ability to maintain clinical measurements within normal limits will improve Outcome: Progressing Goal: Will remain free from infection Outcome: Progressing Goal: Diagnostic test results will improve Outcome: Progressing Goal: Respiratory complications will improve Outcome: Progressing Goal: Cardiovascular complication will be avoided Outcome: Progressing   Problem: Activity: Goal: Risk for activity intolerance will decrease Outcome: Progressing   Problem: Nutrition: Goal: Adequate nutrition will be maintained Outcome: Progressing   Problem: Coping: Goal: Level of anxiety will decrease Outcome: Progressing   Problem: Safety: Goal: Ability to remain free from injury will improve Outcome: Progressing   

## 2024-04-12 ENCOUNTER — Telehealth: Payer: Self-pay | Admitting: Cardiology

## 2024-04-12 NOTE — Telephone Encounter (Signed)
 Spoke with patient on the phone. Informed her that per discharge paperwork and note from Dr Ladona, her Norvasc  had been discontinued and it can be further discussed if needed at her follow up appointment Jan 15th.

## 2024-04-12 NOTE — Telephone Encounter (Signed)
 Pt would like a c/b regarding medication Amlodipine . Pt states that she was discharged from the hospital yesterday and would like to know if she is to take medication as she was prior to hospital admission. Please advise

## 2024-04-13 ENCOUNTER — Telehealth: Admitting: Nurse Practitioner

## 2024-04-13 DIAGNOSIS — L03115 Cellulitis of right lower limb: Secondary | ICD-10-CM

## 2024-04-13 MED ORDER — SULFAMETHOXAZOLE-TRIMETHOPRIM 800-160 MG PO TABS: 1.0000 | ORAL_TABLET | Freq: Two times a day (BID) | ORAL | 0 refills | Status: AC

## 2024-04-13 NOTE — Patient Instructions (Addendum)
 " Lisa Mcclure, thank you for joining Haze LELON Servant, NP for today's virtual visit.  While this provider is not your primary care provider (PCP), if your PCP is located in our provider database this encounter information will be shared with them immediately following your visit.   A [facility] MyChart account gives you access to today's visit and all your visits, tests, and labs performed at [facility]  click here if you don't have a [facility] MyChart account or go to [email or web address]  Consent: (Patient) Lisa Mcclure provided verbal consent for this virtual visit at the beginning of the encounter.  Current Medications:  Current Outpatient Medications:    sulfamethoxazole -trimethoprim  (BACTRIM  DS) 800-160 MG tablet, Take 1 tablet by mouth 2 (two) times daily for 7 days., Disp: 14 tablet, Rfl: 0   ALEVE ARTHRITIS PAIN 1 % GEL, Apply 2 g topically 4 (four) times daily as needed (for pain)., Disp: , Rfl:    apixaban  (ELIQUIS ) 5 MG TABS tablet, Take 1 tablet (5 mg total) by mouth 2 (two) times daily., Disp: 180 tablet, Rfl: 1   budesonide  (PULMICORT ) 0.5 MG/2ML nebulizer solution, Take 2 mLs (0.5 mg total) by nebulization in the morning and at bedtime. (Patient not taking: Reported on 04/09/2024), Disp: 120 mL, Rfl: 1   busPIRone  (BUSPAR ) 7.5 MG tablet, Take 7.5 mg by mouth See admin instructions. Take 7.5 mg by mouth at bedtime and an additional 7.5 mg once a day as needed for anxiety, Disp: , Rfl:    Cholecalciferol  (VITAMIN D3) 125 MCG (5000 UT) CAPS, Take 1 capsule by mouth daily., Disp: , Rfl:    diclofenac  sodium (VOLTAREN ) 1 % GEL, Apply 2.25 g topically 3 (three) times daily as needed (for pain)., Disp: , Rfl:    furosemide  (LASIX ) 20 MG tablet, Take 1 tablet (20 mg total) by mouth daily., Disp: 90 tablet, Rfl: 3   HYDROcodone -acetaminophen  (NORCO) 10-325 MG tablet, Take 1 tablet by mouth every 6 (six) hours as needed for moderate pain (pain score 4-6).,  Disp: , Rfl:    latanoprost  (XALATAN ) 0.005 % ophthalmic solution, Place 1 drop into both eyes at bedtime., Disp: , Rfl:    levothyroxine  (SYNTHROID ) 125 MCG tablet, Take 125 mcg by mouth daily before breakfast. , Disp: , Rfl:    methocarbamol  (ROBAXIN ) 500 MG tablet, Take 500 mg by mouth every 6 (six) hours as needed for muscle spasms., Disp: , Rfl:    metoprolol  tartrate (LOPRESSOR ) 25 MG tablet, Take 0.5 tablets (12.5 mg total) by mouth every 6 (six) hours., Disp: 60 tablet, Rfl: 0   Multiple Vitamins-Minerals (PRESERVISION AREDS 2 PO), Take 1 capsule by mouth in the morning and at bedtime., Disp: , Rfl:    nortriptyline  (PAMELOR ) 25 MG capsule, Take 25 mg by mouth at bedtime., Disp: , Rfl:    polyethylene glycol (MIRALAX  / GLYCOLAX ) 17 g packet, Take 17 g by mouth 2 (two) times daily. Decrease to daily dosing if developing diarrhea/watery stools (Patient taking differently: Take 17 g by mouth daily as needed. Decrease to daily dosing if developing diarrhea/watery stools), Disp: , Rfl:    rOPINIRole  (REQUIP ) 1 MG tablet, Take 1-2 mg by mouth See admin instructions. Take 2mg  by mouth at 1 PM and 1mg  at 9 PM (Patient taking differently: Take 1 mg by mouth 3 (three) times daily.), Disp: , Rfl: 12   SYSTANE COMPLETE PF 0.6 % SOLN, Place 1 drop into both eyes 4 (four) times daily as needed (for dryness).,  Disp: , Rfl:    Medications ordered in this encounter:  Meds ordered this encounter  Medications   sulfamethoxazole -trimethoprim  (BACTRIM  DS) 800-160 MG tablet    Sig: Take 1 tablet by mouth 2 (two) times daily for 7 days.    Dispense:  14 tablet    Refill:  0    Supervising Provider:   BLAISE ALEENE KIDD [[id number]]     *If you need refills on other medications prior to your next appointment, please contact your pharmacy*  Follow-Up: Call back or seek an in-person evaluation if the symptoms worsen or if the condition fails to improve as anticipated.  [facility] [phone]  Other Instructions Follow up with PCP in office in 5 days if no improvement   If you have been instructed to have an in-person evaluation today at a local Urgent Care facility, please use the link below. It will take you to a list of all of our available [facility], including address, phone number and hours of operation. Please do not delay care.  [facility]  If you or a family member do not have a primary care provider, use the link below to schedule a visit and establish care. When you choose a [facility] primary care physician or advanced practice provider, you gain a long-term partner in health. Find a Primary Care Provider  Learn more about [facility] in-office and virtual care options: [facility] - Get Care Now  "

## 2024-04-13 NOTE — Progress Notes (Signed)
 " Virtual Visit Consent   Lisa Mcclure, you are scheduled for a virtual visit with a [facility] provider today. Just as with appointments in the office, your consent must be obtained to participate. Your consent will be active for this visit and any virtual visit you may have with one of our providers in the next 365 days. If you have a MyChart account, a copy of this consent can be sent to you electronically.  As this is a virtual visit, video technology does not allow for your provider to perform a traditional examination. This may limit your provider's ability to fully assess your condition. If your provider identifies any concerns that need to be evaluated in person or the need to arrange testing (such as labs, EKG, etc.), we will make arrangements to do so. Although advances in technology are sophisticated, we cannot ensure that it will always work on either your end or our end. If the connection with a video visit is poor, the visit may have to be switched to a telephone visit. With either a video or telephone visit, we are not always able to ensure that we have a secure connection.  By engaging in this virtual visit, you consent to the provision of healthcare and authorize for your insurance to be billed (if applicable) for the services provided during this visit. Depending on your insurance coverage, you may receive a charge related to this service.  I need to obtain your verbal consent now. Are you willing to proceed with your visit today? Lisa Mcclure has provided verbal consent on 04/13/2024 for a virtual visit (video or telephone). Haze LELON Servant, NP  Date: 04/13/2024 7:40 PM   Virtual Visit via Video Note   I, Haze LELON Servant, connected with  Lisa Mcclure  ([id number], 04/16/1932) on 04/13/2024 at  7:30 PM EST by a video-enabled telemedicine application and verified that I am speaking with the correct person using two identifiers.  Location: Patient: Virtual Visit Location  Patient: Home Provider: Virtual Visit Location Provider: Home Office   I discussed the limitations of evaluation and management by telemedicine and the availability of in person appointments. The patient expressed understanding and agreed to proceed.    History of Present Illness: Lisa Mcclure is a 89 y.o. who identifies as a female who was assigned female at birth, and is being seen today for right foot cellulitis.  Over the past few days Lisa Mcclure has noticed warmth, redness and swelling of the right top of the foot and toes. She also states a few weeks ago she had a skin tear on a few of the right toes as well. States that area healed but not the entire top of the right foot is tender to touch   Problems:  Patient Active Problem List   Diagnosis Date Noted   Acute CHF (congestive heart failure) (HCC) 04/09/2024   Sepsis with acute organ dysfunction without septic shock (HCC) 01/13/2024   Palliative care encounter 01/13/2024   Osteoporosis, postmenopausal 12/19/2023   Closed comminuted intertrochanteric fracture of proximal end of left femur (HCC) 02/02/2023   S/P AAA repair 02/02/2023   Left atrial thrombus 02/02/2023   Closed left hip fracture (HCC) 02/02/2023   DNR (do not resuscitate) 01/25/2023   CHF exacerbation (HCC) 11/29/2022   Acute exacerbation of CHF (congestive heart failure) (HCC) 06/19/2022   Chronic diastolic CHF (congestive heart failure) (HCC) 04/25/2022   Chronic renal failure, stage 3b (HCC) 04/25/2022   Class 2  obesity 04/25/2022   Persistent atrial fibrillation (HCC) 04/22/2022   Chronic cough 03/28/2022   Acute hypoxemic respiratory failure (HCC) 06/15/2021   Paroxysmal atrial fibrillation (HCC) 06/15/2021   Acute renal failure with acute tubular necrosis superimposed on stage 3b chronic kidney disease (HCC) 06/15/2021   Generalized weakness 06/15/2021   Atrial fibrillation with RVR (HCC)    Long term (current) use of anticoagulants    Long term  current use of antiarrhythmic drug    AAA (abdominal aortic aneurysm) without rupture 12/20/2019   AAA (abdominal aortic aneurysm) 12/20/2019   Intolerance of continuous positive airway pressure (CPAP) ventilation 11/26/2019   Severe hypoxemia 08/21/2019   Severe obstructive sleep apnea 07/29/2019   COPD (chronic obstructive pulmonary disease) (HCC) 07/29/2019   RLS (restless legs syndrome) 07/29/2019   Dehydration with hyponatremia 09/13/2018   Bradycardia 09/13/2018   Glaucoma 09/13/2018   OA (osteoarthritis) of shoulder 05/16/2018   Incomplete emptying of bladder 05/17/2017   Labial adhesions 05/17/2017   Mixed stress and urge urinary incontinence 05/17/2017   Urethral sphincter deficiency, intrinsic (ISD) 05/17/2017   Severe aortic stenosis 01/15/2016   Bilateral carotid bruits 05/11/2015   Hyponatremia 04/19/2015   Symptomatic anemia 04/19/2015   Panic attack 04/19/2015   Constipation 04/19/2015   Hyperkalemia 04/17/2015   Influenza A 04/16/2015   Proximal humerus fracture    Left patella fracture    Benign essential HTN    Esophageal reflux    Chronic heart failure with preserved ejection fraction (HFpEF) (HCC)    Hypothyroidism    Lower GI bleed 04/08/2015   Hematochezia    Rectal bleeding 04/03/2015   Patellar fracture 03/06/2015   Left humeral fracture 11/04/2013   Rib fracture 11/04/2013   Hypothyroid 11/04/2013   Essential hypertension 11/04/2013   Right shoulder pain 11/04/2013   OSA (obstructive sleep apnea) 11/13/2008   BOOP (bronchiolitis obliterans with organizing pneumonia) (HCC) 08/25/2008    Allergies: Allergies[1] Medications: Current Medications[2]  Observations/Objective: Patient is well-developed, well-nourished in no acute distress.  Resting comfortably at home.  Head is normocephalic, atraumatic.  No labored breathing.  Speech is clear and coherent with logical content.  Patient is alert and oriented at baseline.  Right foot with erythema,  swelling radiating from metataasal to midfoot   Assessment and Plan: 1. Cellulitis of right lower extremity (Primary) - sulfamethoxazole -trimethoprim  (BACTRIM  DS) 800-160 MG tablet; Take 1 tablet by mouth 2 (two) times daily for 7 days.  Dispense: 14 tablet; Refill: 0  Follow up with PCP in 5 days if no improvement  Follow Up Instructions: I discussed the assessment and treatment plan with the patient. The patient was provided an opportunity to ask questions and all were answered. The patient agreed with the plan and demonstrated an understanding of the instructions.  A copy of instructions were sent to the patient via MyChart unless otherwise noted below.     The patient was advised to call back or seek an in-person evaluation if the symptoms worsen or if the condition fails to improve as anticipated.    Haze LELON Servant, NP     [1]  Allergies Allergen Reactions   Gabapentin Other (See Comments)    Change in mental status    Methocarbamol  Other (See Comments)    Change mental status    Statins Other (See Comments)    Muscle aches and INTERNAL BLEEDING  [2]  Current Outpatient Medications:    sulfamethoxazole -trimethoprim  (BACTRIM  DS) 800-160 MG tablet, Take 1 tablet by mouth 2 (two) times  daily for 7 days., Disp: 14 tablet, Rfl: 0   ALEVE ARTHRITIS PAIN 1 % GEL, Apply 2 g topically 4 (four) times daily as needed (for pain)., Disp: , Rfl:    apixaban  (ELIQUIS ) 5 MG TABS tablet, Take 1 tablet (5 mg total) by mouth 2 (two) times daily., Disp: 180 tablet, Rfl: 1   budesonide  (PULMICORT ) 0.5 MG/2ML nebulizer solution, Take 2 mLs (0.5 mg total) by nebulization in the morning and at bedtime. (Patient not taking: Reported on 04/09/2024), Disp: 120 mL, Rfl: 1   busPIRone  (BUSPAR ) 7.5 MG tablet, Take 7.5 mg by mouth See admin instructions. Take 7.5 mg by mouth at bedtime and an additional 7.5 mg once a day as needed for anxiety, Disp: , Rfl:    Cholecalciferol  (VITAMIN D3) 125 MCG (5000 UT)  CAPS, Take 1 capsule by mouth daily., Disp: , Rfl:    diclofenac  sodium (VOLTAREN ) 1 % GEL, Apply 2.25 g topically 3 (three) times daily as needed (for pain)., Disp: , Rfl:    furosemide  (LASIX ) 20 MG tablet, Take 1 tablet (20 mg total) by mouth daily., Disp: 90 tablet, Rfl: 3   HYDROcodone -acetaminophen  (NORCO) 10-325 MG tablet, Take 1 tablet by mouth every 6 (six) hours as needed for moderate pain (pain score 4-6)., Disp: , Rfl:    latanoprost  (XALATAN ) 0.005 % ophthalmic solution, Place 1 drop into both eyes at bedtime., Disp: , Rfl:    levothyroxine  (SYNTHROID ) 125 MCG tablet, Take 125 mcg by mouth daily before breakfast. , Disp: , Rfl:    methocarbamol  (ROBAXIN ) 500 MG tablet, Take 500 mg by mouth every 6 (six) hours as needed for muscle spasms., Disp: , Rfl:    metoprolol  tartrate (LOPRESSOR ) 25 MG tablet, Take 0.5 tablets (12.5 mg total) by mouth every 6 (six) hours., Disp: 60 tablet, Rfl: 0   Multiple Vitamins-Minerals (PRESERVISION AREDS 2 PO), Take 1 capsule by mouth in the morning and at bedtime., Disp: , Rfl:    nortriptyline  (PAMELOR ) 25 MG capsule, Take 25 mg by mouth at bedtime., Disp: , Rfl:    polyethylene glycol (MIRALAX  / GLYCOLAX ) 17 g packet, Take 17 g by mouth 2 (two) times daily. Decrease to daily dosing if developing diarrhea/watery stools (Patient taking differently: Take 17 g by mouth daily as needed. Decrease to daily dosing if developing diarrhea/watery stools), Disp: , Rfl:    rOPINIRole  (REQUIP ) 1 MG tablet, Take 1-2 mg by mouth See admin instructions. Take 2mg  by mouth at 1 PM and 1mg  at 9 PM (Patient taking differently: Take 1 mg by mouth 3 (three) times daily.), Disp: , Rfl: 12   SYSTANE COMPLETE PF 0.6 % SOLN, Place 1 drop into both eyes 4 (four) times daily as needed (for dryness)., Disp: , Rfl:   "

## 2024-04-25 ENCOUNTER — Encounter: Payer: Self-pay | Admitting: Cardiology

## 2024-04-25 ENCOUNTER — Ambulatory Visit: Attending: Cardiology | Admitting: Cardiology

## 2024-04-25 VITALS — BP 158/80 | HR 82 | Ht 63.5 in | Wt 192.0 lb

## 2024-04-25 DIAGNOSIS — N1832 Chronic kidney disease, stage 3b: Secondary | ICD-10-CM | POA: Insufficient documentation

## 2024-04-25 DIAGNOSIS — Z66 Do not resuscitate: Secondary | ICD-10-CM | POA: Diagnosis not present

## 2024-04-25 DIAGNOSIS — I5041 Acute combined systolic (congestive) and diastolic (congestive) heart failure: Secondary | ICD-10-CM | POA: Diagnosis present

## 2024-04-25 DIAGNOSIS — I350 Nonrheumatic aortic (valve) stenosis: Secondary | ICD-10-CM | POA: Insufficient documentation

## 2024-04-25 DIAGNOSIS — I480 Paroxysmal atrial fibrillation: Secondary | ICD-10-CM | POA: Diagnosis not present

## 2024-04-25 NOTE — Progress Notes (Signed)
 " Cardiology Office Note:  .   Date:  04/25/2024  ID:  Lisa Mcclure, DOB 12/29/1932, MRN [id number] PCP: Yolande Toribio MATSU, MD  [facility] Providers Cardiologist:  Gordy Bergamo, MD   History of Present Illness: .   Lisa Mcclure is a 89 y.o. female patient with obesity, chronic diastolic CHF, hypertensive heart disease and chronic 3A-B kidney disease, paroxysmal atrial fibrillation, anemia secondary to hemorrhoids and diverticulosis, esophageal reflux, depression, OSA intolerant to CPAP on nocturnal 02, rheumatoid arthritis, and venous insufficiency, aortic and coronary calcification, s/p AAA repair on 12/20/2019. Patient underwent pseudoaneurysm sac repair of the AAA on 10/24/2022 and repair of the endoleak.  Per her wishes, further evaluation for any invasive procedures for aortic stenosis including TAVR was canceled, she also wanted to be DNR.   Admitted on 03/30/2024 with acute systolic heart failure with A-fib with RVR and underwent successful direct-current cardioversion on 04/09/2024.  TEE performed on 03/14/2023 revealed new onset heart failure with EF 30 to 35%.  She has maintained her weight loss from the hospitalization, states that her dyspnea is much better and overall no specific complaints today.   Dry weight 195 Lbs     Discussed the use of AI scribe software for clinical note transcription with the patient, who gave verbal consent to proceed.  History of Present Illness Lisa Mcclure is an 89 year old female with aortic stenosis and heart failure who presents for follow-up after hospitalization for atrial fibrillation.  She has had stable shortness of breath since her recent hospitalization for atrial fibrillation, during which her heart function decreased to 35% while she was in atrial fibrillation for four days. She is taking metoprolol  twice a day. She has had no worsening dyspnea since discharge.  She has an abdominal aortic aneurysm repaired with a stent  and is concerned about the possibility of aneurysm expansion.  She is aware of her narrowed aortic valve and has been evaluated for aortic stenosis. She has heart failure symptoms including shortness of breath that may be related to the aortic stenosis. She reports that her condition is currently stable.  Cardiac Studies relevent.    ECHOCARDIOGRAM COMPLETE 01/12/2024 1. Left ventricular ejection fraction, by estimation, is 65 to 70%. The left ventricle has normal function. The left ventricle has no regional wall motion abnormalities. Left ventricular diastolic parameters are indeterminate. 2. Right ventricular systolic function is normal. The right ventricular size is normal. 3. Left atrial size was severely dilated. 4. Mild mitral valve regurgitation. Moderate to severe mitral annular calcification. 5. AV is thickened, calcified with restricted motion. Peak and mean gradients through the valve are 60 and 30 mm hg respectively AVA (VTI) is 0.9 cm2 Dimensionless index is 0.31 OVerall consistent with moderate to severe AS COmpared to echo from 2024 mean gradient has increased (20 to 30 mm Hg). Aortic valve regurgitation is mild. 6. The inferior vena cava is dilated in size with <50% respiratory variability, suggesting right atrial pressure of 15 mmHg.   Aortic Valve: AV is thickened, calcified with restricted motion. Peak and mean gradients through the valve are 60 and 30 mm hg respectively AVA (VTI) is 0.9 cm2 Dimensionless index is 0.31 OVerall consistent with moderate to severe AS COmpared to echo from 2024 mean gradient has increased (20 to 30 mm Hg). Aortic valve regurgitation is mild. Aortic regurgitation PHT measures 377 msec. Aortic valve mean gradient measures 30.0 mmHg. Aortic valve peak gradient measures 60.8 mmHg. Aortic valve area, by VTI  measures 0.88 cm.   ECHO TEE 04/10/2024   1. Left ventricular ejection fraction, by estimation, is 30 to 35%. The left ventricle has moderately  decreased function. 2. Right ventricular systolic function is normal. The right ventricular size is mildly enlarged. 3. Left atrial size was moderately dilated. No left atrial/left atrial appendage thrombus was detected. The LAA emptying velocity was 19 cm/s. 4. Right atrial size was moderately dilated. 5. The mitral valve is normal in structure. Mild to moderate mitral valve regurgitation. No evidence of mitral stenosis. 6. Tricuspid valve regurgitation is mild to moderate. 7. The aortic valve is calcified. Aortic valve regurgitation is not visualized. 8. Limited TEE in the setting of acute on chronic hypoxic respiratory failure in the settingof worsening hypoxia during procedure. Resolved with removing probe and with successful cardioversion.  EKG:      Labs   Lab Results  Component Value Date   CHOL 126 03/14/2019   HDL 49 03/14/2019   LDLCALC 61 03/14/2019   LDLDIRECT 159.2 12/19/2006   TRIG 82 03/14/2019   CHOLHDL 5.1 CALC 12/19/2006   No results found for: LIPOA  Recent Labs    04/09/24 1333 04/10/24 0355 04/11/24 0223  NA 133* 136 133*  K 5.5* 5.7* 4.6  CL 103 100 99  CO2 18* 23 20*  GLUCOSE 104* 125* 108*  BUN 37* 42* 46*  CREATININE 1.02* 1.34* 1.21*  CALCIUM 8.6* 8.8* 8.0*  GFRNONAA 52* 38* 43*    Lab Results  Component Value Date   ALT 16 04/09/2024   AST 24 04/09/2024   ALKPHOS 76 04/09/2024   BILITOT 0.4 04/09/2024      Latest Ref Rng & Units 04/11/2024    2:23 AM 04/10/2024    3:55 AM 04/09/2024    1:33 PM  CBC  WBC 4.0 - 10.5 K/uL 13.3  13.4  13.0   Hemoglobin 12.0 - 15.0 g/dL 88.8  87.1  88.3   Hematocrit 36.0 - 46.0 % 35.9  41.4  38.5   Platelets 150 - 400 K/uL 264  347  321    No results found for: HGBA1C  Lab Results  Component Value Date   TSH 0.607 04/10/2024     ROS  Review of Systems  Cardiovascular:  Positive for dyspnea on exertion. Negative for chest pain and leg swelling.   Physical Exam:   VS:  BP (!) 158/80 (BP  Location: Right Arm, Patient Position: Sitting, Cuff Size: Large)   Pulse 82   Ht 5' 3.5 (1.613 m)   Wt 192 lb (87.1 kg)   SpO2 96%   BMI 33.48 kg/m    Wt Readings from Last 3 Encounters:  04/25/24 192 lb (87.1 kg)  04/11/24 198 lb 13.7 oz (90.2 kg)  03/19/24 193 lb (87.5 kg)    BP Readings from Last 3 Encounters:  04/25/24 (!) 158/80  04/11/24 (!) 116/51  03/19/24 134/75   Physical Exam Neck:     Vascular: No JVD.  Cardiovascular:     Rate and Rhythm: Normal rate and regular rhythm.     Pulses:          Dorsalis pedis pulses are 0 on the right side and 0 on the left side.       Posterior tibial pulses are 0 on the right side and 0 on the left side.     Heart sounds: S1 normal and S2 normal. Murmur heard.     Harsh mid to late systolic murmur is present with  a grade of 3/6 at the upper right sternal border.     No gallop.  Pulmonary:     Effort: Pulmonary effort is normal.     Breath sounds: Normal breath sounds.  Abdominal:     General: Bowel sounds are normal.     Palpations: Abdomen is soft.  Musculoskeletal:     Right lower leg: No edema.     Left lower leg: No edema.     ASSESSMENT AND PLAN: .      ICD-10-CM   1. Acute combined systolic and diastolic heart failure (HCC)  P49.58 Basic metabolic panel with GFR    Brain natriuretic peptide    2. Paroxysmal atrial fibrillation (HCC)  I48.0     3. Severe aortic stenosis  I35.0     4. Stage 3b chronic kidney disease (HCC)  N18.32     5. DNR (do not resuscitate)  [id number]      Assessment & Plan Acute combined systolic and diastolic heart failure with reduced ejection fraction (EF 35%) Heart failure symptoms are well-managed. Recent hospitalization for atrial fibrillation led to decreased heart function, but it is expected to normalize. Blood work indicated elevated heart failure markers. - Ordered blood work to assess current heart function and kidney function - Will reassess heart function with echocardiogram if  TAVR is pursued - Prior to A-fib with RVR, she had normal LVEF.  She was in atrial fibrillation for at least 4 to 5 days prior to presentation to the hospital with acute decompensated heart failure.  She is now appearing to be well compensated but has risk of readmission. - I again stressed to her regarding the importance of heart failure diet, fluid restriction and daily weights.  Severe aortic stenosis Contributing to heart failure symptoms. She is a viable candidate for TAVR, but she is hesitant due to previous experiences and current stability. Discussed risks and benefits of TAVR, emphasizing current stability as an optimal time for intervention. - Discuss TAVR with family and consider referral to Dr. Wonda for further evaluation - If decision to proceed with TAVR is made, will order echocardiogram prior to procedure - She is well compensated and probably best time to proceed with TAVR if she is willing to proceed otherwise conservative therapy and palliative care.  Paroxysmal atrial fibrillation Recent episode lasting four days, contributing to decreased heart function. Heart function is expected to return to normal post-episode. - Continue current management and monitor for recurrence - Unable to uptitrate beta-blockers in view of underlying bradycardia and previously has had markedly reduced heart rate with higher dose of beta-blockers.  In view of hypokalemia not on ACE inhibitors or ARB.  Check labs today.  Stage 3b chronic kidney disease Slightly decreased kidney function and elevated potassium levels. Limitations on medication options due to kidney function and potassium levels. - Ordered blood work to monitor kidney function and potassium levels  Abdominal aortic aneurysm, post stent repair, thrombosed Aneurysm is thrombosed and excluded with a stent. No immediate risk of rupture. Advised to avoid lifting over ten pounds and straining. - Continue current management and monitor for  any changes - The thrombosed sac has increased in size from 5.8 to 6.4 cm although she has had colon embolization a year ago.  Would probably observe for now due to advanced age and underlying comorbidities and patient's daughter intends to having procedures.  Follow up: 2 months for CHF, AS and cardiomyopathy  Signed,  Gordy Bergamo, MD, [facility] 04/25/2024, 10:00 PM [facility] [address] [place], [place] [address] Phone: [phone]. Fax:  [phone]  "

## 2024-04-25 NOTE — Patient Instructions (Addendum)
 Medication Instructions:  Your physician recommends that you continue on your current medications as directed. Please refer to the Current Medication list given to you today.  *If you need a refill on your cardiac medications before your next appointment, please call your pharmacy*  Lab Work: (TODAY) Lab Orders         Basic metabolic panel with GFR         Brain natriuretic peptide     If you have labs (blood work) drawn today and your tests are completely normal, you will receive your results only by: MyChart Message (if you have MyChart) OR A paper copy in the mail If you have any lab test that is abnormal or we need to change your treatment, we will call you to review the results.   Follow-Up: At [facility], you and your health needs are our priority.  As part of our continuing mission to provide you with exceptional heart care, our providers are all part of one team.  This team includes your primary Cardiologist (physician) and Advanced Practice Providers or APPs (Physician Assistants and Nurse Practitioners) who all work together to provide you with the care you need, when you need it.  Your next appointment:   July 01, 2024 @ 3:40pm  Provider:   Gordy Bergamo, MD             We recommend signing up for the patient portal called MyChart.  Patients are able to view lab/test results, encounter notes, upcoming appointments, etc.  Non-urgent messages can be sent to your provider as well, go to [email or web address].

## 2024-04-26 LAB — BASIC METABOLIC PANEL WITH GFR
BUN/Creatinine Ratio: 26 (ref 12–28)
BUN: 30 mg/dL — ABNORMAL HIGH (ref 8–27)
CO2: 22 mmol/L (ref 20–29)
Calcium: 8.1 mg/dL — ABNORMAL LOW (ref 8.7–10.3)
Chloride: 104 mmol/L (ref 96–106)
Creatinine, Ser: 1.14 mg/dL — ABNORMAL HIGH (ref 0.57–1.00)
Glucose: 115 mg/dL — ABNORMAL HIGH (ref 70–99)
Potassium: 5.3 mmol/L — ABNORMAL HIGH (ref 3.5–5.2)
Sodium: 141 mmol/L (ref 134–144)
eGFR: 46 mL/min/1.73 — ABNORMAL LOW

## 2024-04-26 LAB — BRAIN NATRIURETIC PEPTIDE: BNP: 483.8 pg/mL — ABNORMAL HIGH (ref 0.0–100.0)

## 2024-04-27 ENCOUNTER — Ambulatory Visit: Payer: Self-pay | Admitting: Cardiology

## 2024-04-27 DIAGNOSIS — I5041 Acute combined systolic (congestive) and diastolic (congestive) heart failure: Secondary | ICD-10-CM

## 2024-04-27 MED ORDER — BUMETANIDE 1 MG PO TABS: 1.0000 mg | ORAL_TABLET | ORAL | 3 refills | Status: DC

## 2024-04-27 NOTE — Progress Notes (Signed)
 Let patient know I have stopped Furosemide  and started her on Bumex  1 mg in the morning to see if she would do better with this with regards to getting her fluids out better. Recheck BMP and BNP in 2-3 weeks and orders placed.     ICD-10-CM   1. Acute combined systolic and diastolic heart failure (HCC)  P49.58 Basic metabolic panel with GFR    Brain natriuretic peptide     Orders Placed This Encounter  Procedures   Basic metabolic panel with GFR   Brain natriuretic peptide    Meds ordered this encounter  Medications   bumetanide  (BUMEX ) 1 MG tablet    Sig: Take 1 tablet (1 mg total) by mouth every morning.    Dispense:  30 tablet    Refill:  3    Discontinue Furosemide 

## 2024-04-30 ENCOUNTER — Telehealth: Payer: Self-pay | Admitting: Cardiology

## 2024-04-30 MED ORDER — HYDRALAZINE HCL 25 MG PO TABS: 25.0000 mg | ORAL_TABLET | Freq: Three times a day (TID) | ORAL | 3 refills | Status: AC

## 2024-04-30 NOTE — Addendum Note (Signed)
 Addended by: DORINA BASE on: 04/30/2024 04:32 PM   Modules accepted: Orders

## 2024-04-30 NOTE — Telephone Encounter (Signed)
 Hydralazine  25 mg TID please

## 2024-04-30 NOTE — Telephone Encounter (Signed)
 Spoke with pt. Pt states she has been having high BP since medication changes. BP as follows: 176/93 this morning 145/83 yesterday 164/90  172/82 - 1/15 167/84 - 1/14 139/73 - 1/13 Pt denies symptoms. Pt is not taking BP 1.5-2 hours after taking morning medication. Advised pt to start doing so. 911/ed precautions reviewed. Advised I would send to Dr Ladona for review.  Last seen in office on 04/25/24. Pt stated understanding.

## 2024-04-30 NOTE — Telephone Encounter (Signed)
 Medication has been sent to her pharmacy. Patient is aware and will start tomorrow after they deliver it.

## 2024-04-30 NOTE — Telephone Encounter (Signed)
 Pt c/o BP issue: STAT if pt c/o blurred vision, one-sided weakness or slurred speech.  STAT if BP is GREATER than 180/120 TODAY.  STAT if BP is LESS than 90/60 and SYMPTOMATIC TODAY  1. What is your BP concern? Running high   2. Have you taken any BP medication today? No   3. What are your last 5 BP readings? 146/76 (now) 170/130 (earlier today) 170/93 145/83 130/64  4. Are you having any other symptoms (ex. Dizziness, headache, blurred vision, passed out)?  No

## 2024-04-30 NOTE — Progress Notes (Signed)
 Let patient know I have stopped Furosemide  and started her on Bumex  1 mg in the morning to see if she would do better with this with regards to getting her fluids out better. Recheck BMP and BNP in 2-3 weeks and orders placed.        ICD-10-CM    1. Acute combined systolic and diastolic heart failure (HCC)  P49.58 Basic metabolic panel with GFR      Brain natriuretic peptide          Orders Placed This Encounter  Procedures   Basic metabolic panel with GFR   Brain natriuretic peptide          Meds ordered this encounter  Medications   bumetanide  (BUMEX ) 1 MG tablet      Sig: Take 1 tablet (1 mg total) by mouth every morning.      Dispense:  30 tablet      Refill:  3      Discontinue Furosemide 

## 2024-05-03 ENCOUNTER — Other Ambulatory Visit: Payer: Self-pay

## 2024-05-03 MED ORDER — BUMETANIDE 1 MG PO TABS: 1.0000 mg | ORAL_TABLET | ORAL | 3 refills | Status: AC

## 2024-06-20 ENCOUNTER — Inpatient Hospital Stay (HOSPITAL_COMMUNITY): Admission: RE | Admit: 2024-06-20 | Source: Ambulatory Visit

## 2024-07-01 ENCOUNTER — Ambulatory Visit: Admitting: Cardiology

## 2024-09-17 ENCOUNTER — Ambulatory Visit: Admitting: Vascular Surgery
